# Patient Record
Sex: Male | Born: 1960 | Race: Black or African American | Hispanic: No | Smoking: Never smoker
Health system: Southern US, Community
[De-identification: ages and names within clinical notes are randomized; demographics above are authoritative.]

## PROBLEM LIST (undated history)

## (undated) DIAGNOSIS — I428 Other cardiomyopathies: Secondary | ICD-10-CM

## (undated) DIAGNOSIS — E669 Obesity, unspecified: Secondary | ICD-10-CM

## (undated) DIAGNOSIS — I509 Heart failure, unspecified: Secondary | ICD-10-CM

## (undated) DIAGNOSIS — N186 End stage renal disease: Secondary | ICD-10-CM

## (undated) DIAGNOSIS — E119 Type 2 diabetes mellitus without complications: Secondary | ICD-10-CM

## (undated) DIAGNOSIS — Z992 Dependence on renal dialysis: Secondary | ICD-10-CM

## (undated) DIAGNOSIS — I10 Essential (primary) hypertension: Secondary | ICD-10-CM

## (undated) DIAGNOSIS — I2510 Atherosclerotic heart disease of native coronary artery without angina pectoris: Secondary | ICD-10-CM

## (undated) DIAGNOSIS — I739 Peripheral vascular disease, unspecified: Secondary | ICD-10-CM

## (undated) DIAGNOSIS — N19 Unspecified kidney failure: Secondary | ICD-10-CM

## (undated) DIAGNOSIS — I5020 Unspecified systolic (congestive) heart failure: Secondary | ICD-10-CM

## (undated) DIAGNOSIS — I5022 Chronic systolic (congestive) heart failure: Secondary | ICD-10-CM

## (undated) DIAGNOSIS — I219 Acute myocardial infarction, unspecified: Secondary | ICD-10-CM

## (undated) HISTORY — PX: IRRIGATION AND DEBRIDEMENT ABSCESS: SHX5252

## (undated) HISTORY — PX: AV FISTULA PLACEMENT: SHX1204

## (undated) HISTORY — PX: TOE AMPUTATION: SHX809

## (undated) HISTORY — DX: Peripheral vascular disease, unspecified: I73.9

## (undated) HISTORY — PX: EYE SURGERY: SHX253

## (undated) SURGERY — IRRIGATION AND DEBRIDEMENT FOOT
Anesthesia: Monitor Anesthesia Care | Laterality: Right

## (undated) SURGICAL SUPPLY — 1 items: BALLN ATHLETIS 9X40X75 (BALLOONS) ×2 IMPLANT

---

## 2003-08-21 ENCOUNTER — Emergency Department (HOSPITAL_COMMUNITY): Admission: EM | Admit: 2003-08-21 | Discharge: 2003-08-21 | Payer: Self-pay | Admitting: Emergency Medicine

## 2008-02-06 ENCOUNTER — Emergency Department (HOSPITAL_COMMUNITY): Admission: EM | Admit: 2008-02-06 | Discharge: 2008-02-06 | Payer: Self-pay | Admitting: Emergency Medicine

## 2018-02-17 HISTORY — PX: CARDIAC CATHETERIZATION: SHX172

## 2018-03-17 DIAGNOSIS — I10 Essential (primary) hypertension: Secondary | ICD-10-CM | POA: Insufficient documentation

## 2018-03-17 DIAGNOSIS — I428 Other cardiomyopathies: Secondary | ICD-10-CM | POA: Insufficient documentation

## 2018-05-14 DIAGNOSIS — E119 Type 2 diabetes mellitus without complications: Secondary | ICD-10-CM | POA: Insufficient documentation

## 2018-07-23 DIAGNOSIS — E785 Hyperlipidemia, unspecified: Secondary | ICD-10-CM | POA: Diagnosis present

## 2018-07-23 DIAGNOSIS — I739 Peripheral vascular disease, unspecified: Secondary | ICD-10-CM | POA: Insufficient documentation

## 2018-07-23 DIAGNOSIS — M109 Gout, unspecified: Secondary | ICD-10-CM | POA: Insufficient documentation

## 2018-07-23 DIAGNOSIS — I5022 Chronic systolic (congestive) heart failure: Secondary | ICD-10-CM | POA: Insufficient documentation

## 2018-07-23 DIAGNOSIS — E1122 Type 2 diabetes mellitus with diabetic chronic kidney disease: Secondary | ICD-10-CM | POA: Insufficient documentation

## 2018-07-26 DIAGNOSIS — N189 Chronic kidney disease, unspecified: Secondary | ICD-10-CM | POA: Insufficient documentation

## 2018-07-27 DIAGNOSIS — E039 Hypothyroidism, unspecified: Secondary | ICD-10-CM | POA: Diagnosis present

## 2018-07-27 DIAGNOSIS — N2581 Secondary hyperparathyroidism of renal origin: Secondary | ICD-10-CM | POA: Insufficient documentation

## 2018-09-20 ENCOUNTER — Encounter (HOSPITAL_COMMUNITY): Payer: Self-pay | Admitting: Emergency Medicine

## 2018-09-20 ENCOUNTER — Emergency Department (HOSPITAL_COMMUNITY): Payer: Medicare Other

## 2018-09-20 ENCOUNTER — Other Ambulatory Visit: Payer: Self-pay

## 2018-09-20 ENCOUNTER — Inpatient Hospital Stay (HOSPITAL_COMMUNITY)
Admission: EM | Admit: 2018-09-20 | Discharge: 2018-09-24 | DRG: 312 | Disposition: A | Discharge status: Home / Self Care | Payer: Medicare Other | Attending: Internal Medicine | Admitting: Internal Medicine

## 2018-09-20 DIAGNOSIS — R509 Fever, unspecified: Secondary | ICD-10-CM | POA: Diagnosis not present

## 2018-09-20 DIAGNOSIS — I5022 Chronic systolic (congestive) heart failure: Secondary | ICD-10-CM | POA: Diagnosis present

## 2018-09-20 DIAGNOSIS — Z7982 Long term (current) use of aspirin: Secondary | ICD-10-CM

## 2018-09-20 DIAGNOSIS — R112 Nausea with vomiting, unspecified: Secondary | ICD-10-CM | POA: Diagnosis present

## 2018-09-20 DIAGNOSIS — M898X9 Other specified disorders of bone, unspecified site: Secondary | ICD-10-CM | POA: Diagnosis present

## 2018-09-20 DIAGNOSIS — D631 Anemia in chronic kidney disease: Secondary | ICD-10-CM | POA: Diagnosis present

## 2018-09-20 DIAGNOSIS — I132 Hypertensive heart and chronic kidney disease with heart failure and with stage 5 chronic kidney disease, or end stage renal disease: Secondary | ICD-10-CM | POA: Diagnosis present

## 2018-09-20 DIAGNOSIS — Z992 Dependence on renal dialysis: Secondary | ICD-10-CM

## 2018-09-20 DIAGNOSIS — I10 Essential (primary) hypertension: Secondary | ICD-10-CM | POA: Diagnosis present

## 2018-09-20 DIAGNOSIS — I953 Hypotension of hemodialysis: Secondary | ICD-10-CM | POA: Diagnosis not present

## 2018-09-20 DIAGNOSIS — K219 Gastro-esophageal reflux disease without esophagitis: Secondary | ICD-10-CM | POA: Diagnosis present

## 2018-09-20 DIAGNOSIS — N186 End stage renal disease: Secondary | ICD-10-CM | POA: Diagnosis present

## 2018-09-20 DIAGNOSIS — E1122 Type 2 diabetes mellitus with diabetic chronic kidney disease: Secondary | ICD-10-CM | POA: Diagnosis present

## 2018-09-20 DIAGNOSIS — Z794 Long term (current) use of insulin: Secondary | ICD-10-CM

## 2018-09-20 DIAGNOSIS — I428 Other cardiomyopathies: Secondary | ICD-10-CM | POA: Diagnosis present

## 2018-09-20 DIAGNOSIS — Z79899 Other long term (current) drug therapy: Secondary | ICD-10-CM

## 2018-09-20 DIAGNOSIS — I447 Left bundle-branch block, unspecified: Secondary | ICD-10-CM | POA: Diagnosis present

## 2018-09-20 DIAGNOSIS — I2510 Atherosclerotic heart disease of native coronary artery without angina pectoris: Secondary | ICD-10-CM | POA: Diagnosis present

## 2018-09-20 DIAGNOSIS — E871 Hypo-osmolality and hyponatremia: Secondary | ICD-10-CM | POA: Diagnosis present

## 2018-09-20 DIAGNOSIS — E785 Hyperlipidemia, unspecified: Secondary | ICD-10-CM | POA: Diagnosis present

## 2018-09-20 DIAGNOSIS — R42 Dizziness and giddiness: Secondary | ICD-10-CM | POA: Diagnosis present

## 2018-09-20 DIAGNOSIS — B349 Viral infection, unspecified: Secondary | ICD-10-CM | POA: Diagnosis present

## 2018-09-20 DIAGNOSIS — R05 Cough: Secondary | ICD-10-CM | POA: Diagnosis present

## 2018-09-20 DIAGNOSIS — R059 Cough, unspecified: Secondary | ICD-10-CM

## 2018-09-20 DIAGNOSIS — E119 Type 2 diabetes mellitus without complications: Secondary | ICD-10-CM

## 2018-09-20 HISTORY — DX: Essential (primary) hypertension: I10

## 2018-09-20 HISTORY — DX: Type 2 diabetes mellitus without complications: E11.9

## 2018-09-20 HISTORY — DX: Unspecified kidney failure: N19

## 2018-09-20 LAB — CBC WITH DIFFERENTIAL/PLATELET
ABS IMMATURE GRANULOCYTES: 0.03 10*3/uL (ref 0.00–0.07)
BASOS PCT: 1 %
Basophils Absolute: 0.1 10*3/uL (ref 0.0–0.1)
Eosinophils Absolute: 0.3 10*3/uL (ref 0.0–0.5)
Eosinophils Relative: 3 %
HEMATOCRIT: 44.8 % (ref 39.0–52.0)
Hemoglobin: 14.1 g/dL (ref 13.0–17.0)
Immature Granulocytes: 0 %
LYMPHS ABS: 1.1 10*3/uL (ref 0.7–4.0)
Lymphocytes Relative: 11 %
MCH: 30.1 pg (ref 26.0–34.0)
MCHC: 31.5 g/dL (ref 30.0–36.0)
MCV: 95.5 fL (ref 80.0–100.0)
MONOS PCT: 15 %
Monocytes Absolute: 1.5 10*3/uL — ABNORMAL HIGH (ref 0.1–1.0)
NEUTROS ABS: 7 10*3/uL (ref 1.7–7.7)
Neutrophils Relative %: 70 %
PLATELETS: 185 10*3/uL (ref 150–400)
RBC: 4.69 MIL/uL (ref 4.22–5.81)
RDW: 16.2 % — ABNORMAL HIGH (ref 11.5–15.5)
WBC: 9.9 10*3/uL (ref 4.0–10.5)
nRBC: 0 % (ref 0.0–0.2)

## 2018-09-20 LAB — COMPREHENSIVE METABOLIC PANEL
ALT: 15 U/L (ref 0–44)
AST: 11 U/L — AB (ref 15–41)
Albumin: 4.2 g/dL (ref 3.5–5.0)
Alkaline Phosphatase: 99 U/L (ref 38–126)
Anion gap: 12 (ref 5–15)
BILIRUBIN TOTAL: 0.8 mg/dL (ref 0.3–1.2)
BUN: 19 mg/dL (ref 6–20)
CALCIUM: 8.9 mg/dL (ref 8.9–10.3)
CO2: 30 mmol/L (ref 22–32)
CREATININE: 7.19 mg/dL — AB (ref 0.61–1.24)
Chloride: 89 mmol/L — ABNORMAL LOW (ref 98–111)
GFR, EST AFRICAN AMERICAN: 9 mL/min — AB (ref >60)
GFR, EST NON AFRICAN AMERICAN: 8 mL/min — AB (ref >60)
Glucose, Bld: 293 mg/dL — ABNORMAL HIGH (ref 70–99)
Potassium: 5.1 mmol/L (ref 3.5–5.1)
Sodium: 131 mmol/L — ABNORMAL LOW (ref 135–145)
TOTAL PROTEIN: 9 g/dL — AB (ref 6.5–8.1)

## 2018-09-20 LAB — URINALYSIS, ROUTINE W REFLEX MICROSCOPIC
Bacteria, UA: NONE SEEN
Bilirubin Urine: NEGATIVE
Glucose, UA: >=500 mg/dL — AB
Hgb urine dipstick: NEGATIVE
Ketones, ur: NEGATIVE mg/dL
Nitrite: NEGATIVE
Protein, ur: >=300 mg/dL — AB
SPECIFIC GRAVITY, URINE: 1.012 (ref 1.005–1.030)
pH: 8 (ref 5.0–8.0)

## 2018-09-20 LAB — LACTIC ACID, PLASMA
LACTIC ACID, VENOUS: 2.3 mmol/L — AB (ref 0.5–1.9)
Lactic Acid, Venous: 2.4 mmol/L (ref 0.5–1.9)

## 2018-09-20 LAB — INFLUENZA PANEL BY PCR (TYPE A & B)
INFLAPCR: NEGATIVE
INFLBPCR: NEGATIVE

## 2018-09-20 LAB — LIPASE, BLOOD: LIPASE: 56 U/L — AB (ref 11–51)

## 2018-09-20 MED ORDER — INSULIN ASPART 100 UNIT/ML ~~LOC~~ SOLN: 0.0000 [IU] | SUBCUTANEOUS | Status: DC

## 2018-09-20 MED ORDER — ACETAMINOPHEN 325 MG PO TABS
650.0000 mg | ORAL_TABLET | Freq: Four times a day (QID) | ORAL | Status: DC | PRN
  Administered 2018-09-21 – 2018-09-22 (×2): 650 mg via ORAL
  Filled 2018-09-20 (×2): qty 2

## 2018-09-20 MED ORDER — ATORVASTATIN CALCIUM 40 MG PO TABS
40.0000 mg | ORAL_TABLET | Freq: Every evening | ORAL | Status: DC
  Administered 2018-09-21 – 2018-09-23 (×4): 40 mg via ORAL
  Filled 2018-09-20 (×4): qty 1

## 2018-09-20 MED ORDER — ONDANSETRON HCL 4 MG PO TABS: 4.0000 mg | ORAL_TABLET | Freq: Four times a day (QID) | ORAL | Status: DC | PRN

## 2018-09-20 MED ORDER — SODIUM BICARBONATE 650 MG PO TABS
650.0000 mg | ORAL_TABLET | Freq: Four times a day (QID) | ORAL | Status: DC
  Administered 2018-09-21 – 2018-09-24 (×15): 650 mg via ORAL
  Filled 2018-09-20 (×22): qty 1

## 2018-09-20 MED ORDER — ACETAMINOPHEN 500 MG PO TABS
1000.0000 mg | ORAL_TABLET | Freq: Once | ORAL | Status: AC
  Administered 2018-09-20: 1000 mg via ORAL
  Filled 2018-09-20: qty 2

## 2018-09-20 MED ORDER — SEVELAMER CARBONATE 2.4 G PO PACK
2.4000 g | PACK | Freq: Three times a day (TID) | ORAL | Status: DC
  Filled 2018-09-20 (×5): qty 1

## 2018-09-20 MED ORDER — PANTOPRAZOLE SODIUM 20 MG PO TBEC: 20.0000 mg | DELAYED_RELEASE_TABLET | Freq: Every day | ORAL | Status: DC

## 2018-09-20 MED ORDER — PANTOPRAZOLE SODIUM 40 MG PO TBEC
40.0000 mg | DELAYED_RELEASE_TABLET | Freq: Every day | ORAL | Status: DC
  Administered 2018-09-21: 40 mg via ORAL
  Filled 2018-09-20: qty 1

## 2018-09-20 MED ORDER — ONDANSETRON HCL 4 MG/2ML IJ SOLN: 4.0000 mg | Freq: Four times a day (QID) | INTRAMUSCULAR | Status: DC | PRN

## 2018-09-20 MED ORDER — LACTATED RINGERS IV BOLUS
500.0000 mL | Freq: Once | INTRAVENOUS | Status: AC
  Administered 2018-09-20: 500 mL via INTRAVENOUS

## 2018-09-20 MED ORDER — CARVEDILOL 12.5 MG PO TABS
25.0000 mg | ORAL_TABLET | Freq: Two times a day (BID) | ORAL | Status: DC
  Administered 2018-09-21 – 2018-09-24 (×8): 25 mg via ORAL
  Filled 2018-09-20 (×8): qty 2

## 2018-09-20 MED ORDER — ACETAMINOPHEN 650 MG RE SUPP: 650.0000 mg | Freq: Four times a day (QID) | RECTAL | Status: DC | PRN

## 2018-09-20 MED ORDER — INSULIN ASPART 100 UNIT/ML ~~LOC~~ SOLN
0.0000 [IU] | Freq: Three times a day (TID) | SUBCUTANEOUS | Status: DC
  Administered 2018-09-21: 5 [IU] via SUBCUTANEOUS
  Administered 2018-09-21 (×2): 3 [IU] via SUBCUTANEOUS
  Administered 2018-09-22 (×3): 5 [IU] via SUBCUTANEOUS
  Administered 2018-09-23: 2 [IU] via SUBCUTANEOUS
  Administered 2018-09-23 (×2): 3 [IU] via SUBCUTANEOUS
  Administered 2018-09-24: 5 [IU] via SUBCUTANEOUS
  Administered 2018-09-24: 3 [IU] via SUBCUTANEOUS

## 2018-09-20 MED ORDER — ONDANSETRON HCL 4 MG/2ML IJ SOLN
4.0000 mg | Freq: Once | INTRAMUSCULAR | Status: AC
  Administered 2018-09-20: 4 mg via INTRAVENOUS
  Filled 2018-09-20: qty 2

## 2018-09-20 MED ORDER — INSULIN NPH (HUMAN) (ISOPHANE) 100 UNIT/ML ~~LOC~~ SUSP
5.0000 [IU] | Freq: Two times a day (BID) | SUBCUTANEOUS | Status: DC
  Administered 2018-09-21 (×2): 5 [IU] via SUBCUTANEOUS
  Filled 2018-09-20 (×2): qty 10

## 2018-09-20 MED ORDER — HEPARIN SODIUM (PORCINE) 5000 UNIT/ML IJ SOLN
5000.0000 [IU] | Freq: Three times a day (TID) | INTRAMUSCULAR | Status: DC
  Administered 2018-09-21 – 2018-09-24 (×12): 5000 [IU] via SUBCUTANEOUS
  Filled 2018-09-20 (×12): qty 1

## 2018-09-20 MED ORDER — CARVEDILOL 12.5 MG PO TABS: 25.0000 mg | ORAL_TABLET | Freq: Two times a day (BID) | ORAL | Status: DC

## 2018-09-20 MED ORDER — ASPIRIN 81 MG PO CHEW
81.0000 mg | CHEWABLE_TABLET | Freq: Every day | ORAL | Status: DC
  Administered 2018-09-21 – 2018-09-24 (×4): 81 mg via ORAL
  Filled 2018-09-20 (×4): qty 1

## 2018-09-20 NOTE — ED Notes (Signed)
Patient refuse to ambulate in hall way RN notfied

## 2018-09-20 NOTE — ED Notes (Signed)
Date and time results received: 09/20/18 2227 (use smartphrase ".now" to insert current time)  Test: lactic acid Critical Value: 2.5  Name of Provider Notified: Dr. Dayna Barker At 2227  Orders Received? Or Actions Taken?: no/na

## 2018-09-20 NOTE — H&P (Signed)
History and Physical    UTHMAN MROCZKOWSKI UXN:[id number] DOB: [birth year] DOA: 09/20/2018  PCP: Patient, No Pcp Per  Patient coming from: Home  I have personally briefly reviewed patient's old medical records in [place]  Chief Complaint: Fever, lightheaded  HPI: DOUGLAS SMOLINSKY is a 57 y.o. male with medical history significant of ESRD dialysis MWF, DM2, HTN.  Patient was finishing up dialysis today, removed 6.3L.  Patient developed lightheadedness and one episode of vomiting.  Found to be running fever and have BP 25K systolic.  Brought in to ED.  On ROS it seems patient has been having chronic N/V to some degree.  Fever ongoing since Friday he reports.   ED Course: BP improved to 270W systolic with just 237SE LR.  Tm 101.5.  CXR neg.  Tolerating POs.  LFTs nl.  LA 2.4.   Review of Systems: As per HPI otherwise 10 point review of systems negative.   Past Medical History:  Diagnosis Date  . Diabetes mellitus without complication ([place])   . Hypertension   . Kidney failure     Past Surgical History:  Procedure Laterality Date  . AV FISTULA PLACEMENT    . IRRIGATION AND DEBRIDEMENT ABSCESS    . TOE AMPUTATION       reports that he has never smoked. He has never used smokeless tobacco. He reports that he drank alcohol. He reports that he does not use drugs.  No Known Allergies  History reviewed. No pertinent family history. No sick contacts in family.  Prior to Admission medications   Medication Sig Start Date End Date Taking? Authorizing Provider  amLODipine (NORVASC) 5 MG tablet Take 5 mg by mouth daily. 07/26/18  Yes [provider]  aspirin 81 MG chewable tablet Chew 81 mg by mouth daily. 09/01/18  Yes [provider]  atorvastatin (LIPITOR) 40 MG tablet Take 40 mg by mouth every evening.  09/01/18  Yes [provider]  carvedilol (COREG) 25 MG tablet Take 25 mg by mouth 2 (two) times daily. 08/31/18  Yes [provider]  ENTRESTO  24-26 MG Take 1 tablet by mouth 2 (two) times daily. 07/29/18  Yes [provider]  HUMULIN 70/30 (70-30) 100 UNIT/ML injection Inject 30 Units into the skin every other day. At bedtime:: Prescribed 20 units in the morning and 6 units in the evening 08/31/18  Yes [provider]  lidocaine-prilocaine (EMLA) cream Apply 1 application topically every Monday, Wednesday, and Friday. At dialysis 08/17/18  Yes [provider]  pantoprazole (PROTONIX) 20 MG tablet Take 20 mg by mouth daily.    Yes [provider]  sildenafil (VIAGRA) 100 MG tablet Take 100 mg by mouth daily as needed for erectile dysfunction.  09/02/18  Yes [provider]  sodium bicarbonate 650 MG tablet Take 1 tablet by mouth 4 (four) times daily. 08/31/18  Yes [provider]  RENVELA 2.4 g PACK VIGOROUSLY [place] 1 PACKET IN WATER (IT WILL NOT DISSOLVE) AND DRINK 3 TIMES DAILY WITH MEALS 07/16/18   [provider]    Physical Exam: Vitals:   09/20/18 1749 09/20/18 1750 09/20/18 2114 09/20/18 2130  BP: (!) 97/57  (!) 141/78 133/60  Pulse: 83  90 88  Resp: 20  20   Temp: 99.8 F (37.7 C)  (!) 101.5 F (38.6 C)   TempSrc: Oral  Oral   SpO2: 95%  94% 95%  Weight:  116.1 kg    Height:  6'  1" (1.854 m)      Constitutional: NAD, calm, comfortable Eyes: PERRL, lids and conjunctivae normal ENMT: Mucous membranes are moist. Posterior pharynx clear of any exudate or lesions.Normal dentition.  Neck: normal, supple, no masses, no thyromegaly Respiratory: clear to auscultation bilaterally, no wheezing, no crackles. Normal respiratory effort. No accessory muscle use.  Cardiovascular: Regular rate and rhythm, no murmurs / rubs / gallops. No extremity edema. 2+ pedal pulses. No carotid bruits.  Abdomen: no tenderness, no masses palpated. No hepatosplenomegaly. Bowel sounds positive.  Musculoskeletal: no clubbing / cyanosis. No joint deformity upper and lower extremities.  Good ROM, no contractures. Normal muscle tone.  Skin: no rashes, lesions, ulcers. No induration Neurologic: CN 2-12 grossly intact. Sensation intact, DTR normal. Strength 5/5 in all 4.  Psychiatric: Normal judgment and insight. Alert and oriented x 3. Normal mood.    Labs on Admission: I have personally reviewed following labs and imaging studies  CBC: Recent Labs  Lab 09/20/18 1902  WBC 9.9  NEUTROABS 7.0  HGB 14.1  HCT 44.8  MCV 95.5  PLT 706   Basic Metabolic Panel: Recent Labs  Lab 09/20/18 1902  NA 131*  K 5.1  CL 89*  CO2 30  GLUCOSE 293*  BUN 19  CREATININE 7.19*  CALCIUM 8.9   GFR: Estimated Creatinine Clearance: 15.5 mL/min (A) (by C-G formula based on SCr of 7.19 mg/dL (H)). Liver Function Tests: Recent Labs  Lab 09/20/18 1902  AST 11*  ALT 15  ALKPHOS 99  BILITOT 0.8  PROT 9.0*  ALBUMIN 4.2   Recent Labs  Lab 09/20/18 1902  LIPASE 56*   No results for input(s): AMMONIA in the last 168 hours. Coagulation Profile: No results for input(s): INR, PROTIME in the last 168 hours. Cardiac Enzymes: No results for input(s): CKTOTAL, CKMB, CKMBINDEX, TROPONINI in the last 168 hours. BNP (last 3 results) No results for input(s): PROBNP in the last 8760 hours. HbA1C: No results for input(s): HGBA1C in the last 72 hours. CBG: No results for input(s): GLUCAP in the last 168 hours. Lipid Profile: No results for input(s): CHOL, HDL, LDLCALC, TRIG, CHOLHDL, LDLDIRECT in the last 72 hours. Thyroid Function Tests: No results for input(s): TSH, T4TOTAL, FREET4, T3FREE, THYROIDAB in the last 72 hours. Anemia Panel: No results for input(s): VITAMINB12, FOLATE, FERRITIN, TIBC, IRON, RETICCTPCT in the last 72 hours. Urine analysis:    Component Value Date/Time   COLORURINE YELLOW 09/20/2018 2118   APPEARANCEUR CLEAR 09/20/2018 2118   LABSPEC 1.012 09/20/2018 2118   PHURINE 8.0 09/20/2018 2118   GLUCOSEU >=500 (A) 09/20/2018 2118   HGBUR NEGATIVE 09/20/2018  2118   BILIRUBINUR NEGATIVE 09/20/2018 2118   KETONESUR NEGATIVE 09/20/2018 2118   PROTEINUR >=300 (A) 09/20/2018 2118   NITRITE NEGATIVE 09/20/2018 2118   LEUKOCYTESUR SMALL (A) 09/20/2018 2118    Radiological Exams on Admission: Dg Chest 2 View  Result Date: 09/20/2018 CLINICAL DATA:  Nausea and vomiting for 3 days. Lightheaded and dizzy. Decreased blood pressure today after dialysis. History of diabetes, hypertension, kidney failure. EXAM: CHEST - 2 VIEW COMPARISON:  None. FINDINGS: Heart size and pulmonary vascularity are normal for technique. Lungs are clear and expanded. No blunting of costophrenic angles. No pneumothorax. Mediastinal contours appear intact. Degenerative changes in the spine. IMPRESSION: No active cardiopulmonary disease. Electronically Signed   By: Lucienne Capers M.D.   On: 09/20/2018 22:03    EKG: Independently reviewed.  Assessment/Plan Principal Problem:   Fever Active Problems:   ESRD (end stage  renal disease) ([place])   Nausea & vomiting   DM2 (diabetes mellitus, type 2) ([facility])   HTN (hypertension)    1. Fever - possibly just viral illness? 1. borderline hypotension on presentation after 6.3L off at dialysis today.  Resolved after just 500cc LR 2. UCx, BCx 3. Repeat CBC/BMP in AM 4. Tylenol for fever 5. Holding of on ABx for the moment 6. Zofran PRN nausea 7. Influenza PCR pending 2. HTN - 1. Given the hypotension on presentation: will hold entresto and norvasc for the moment 2. Continue coreg 3. DM2 - 1. NPH 5u BID 2. Sensitive scale SSI AC 4. ESRD - 1. Consult nephrology for routine IP dialysis, looks like he gets this MWF 5. GERD - 1. Told patient to stop taking tums, start taking renvela and will increase protonix dose to 40mg  PO daily.  DVT prophylaxis: Heparin  [place] Code Status: Full Family Communication: Family at bedside Disposition Plan: Home after admit Consults called: Nephrology for routine IP dialysis Admission status: Place in  obs     Allanah Mcfarland, [facility] Pager [phone] Only works nights!  If 7AM-7PM, please contact the primary day team physician taking care of patient  [email or web address] Password [email or web address]  09/20/2018, 11:08 PM

## 2018-09-20 NOTE — ED Provider Notes (Signed)
Emergency Department Provider Note   I have reviewed the triage vital signs and the nursing notes.   HISTORY  Chief Complaint Emesis   HPI [name] [name] is a 57 y.o. male who has multiple medical problems documented below the presents to the emergency department today secondary to notes, generalized weakness and vomiting.  Patient states that he vomited a couple times while at dialysis.  Nursing note states he is get dialysis for over 6 hours however patient states he was only there for 4 hours.  It took off normal amount.  He started feel little bit lightheaded during that notes when he vomited.  Nonbloody nonbilious.  Last meal was earlier this morning.  EMS picked him up and brought him here for further evaluation and given Zofran in route which seems to help his symptoms some.  He has not had any diarrhea or urinary symptoms.  No rashes.  No headaches.  No recent fevers or sick contacts he knows of.  No chest pain, abdominal pain or back pain at this time. No other associated or modifying symptoms.    Past Medical History:  Diagnosis Date  . Diabetes mellitus without complication ([place])   . Hypertension   . Kidney failure     There are no active problems to display for this patient.   Past Surgical History:  Procedure Laterality Date  . AV FISTULA PLACEMENT    . IRRIGATION AND DEBRIDEMENT ABSCESS    . TOE AMPUTATION      Current Outpatient Rx  . Order #: [id number] Class: Historical Med  . Order #: [id number] Class: Historical Med  . Order #: [id number] Class: Historical Med  . Order #: [id number] Class: Historical Med  . Order #: [id number] Class: Historical Med  . Order #: [id number] Class: Historical Med  . Order #: [id number] Class: Historical Med  . Order #: [id number] Class: Historical Med  . Order #: [id number] Class: Historical Med  . Order #: [id number] Class: Historical Med  . Order #: [id number] Class: Historical Med    Allergies Patient has no known allergies.  History reviewed.  No pertinent family history.  Social History Social History   Tobacco Use  . Smoking status: Never Smoker  . Smokeless tobacco: Never Used  Substance Use Topics  . Alcohol use: Not Currently  . Drug use: Never    Review of Systems  All other systems negative except as documented in the HPI. All pertinent positives and negatives as reviewed in the HPI. ____________________________________________   PHYSICAL EXAM:  VITAL SIGNS: ED Triage Vitals  Enc Vitals Group     BP 09/20/18 1749 (!) 97/57     Pulse Rate 09/20/18 1749 83     Resp 09/20/18 1749 20     Temp 09/20/18 1749 99.8 F (37.7 C)     Temp Source 09/20/18 1749 Oral     SpO2 09/20/18 1749 95 %     Weight 09/20/18 1750 256 lb (116.1 kg)     Height 09/20/18 1750 6\' 1"  (1.854 m)    Constitutional: Alert and oriented. Well appearing and in no acute distress. Eyes: Conjunctivae are normal. PERRL. EOMI. Head: Atraumatic. Nose: No congestion/rhinnorhea. Mouth/Throat: Mucous membranes are moist.  Oropharynx non-erythematous. Neck: No stridor.  No meningeal signs.   Cardiovascular: Normal rate, regular rhythm. Good peripheral circulation. Grossly normal heart sounds.   Respiratory: Normal respiratory effort.  No retractions. Lungs CTAB. Gastrointestinal: Soft and mild epigastric ttp. No distention.  Musculoskeletal: No lower extremity tenderness nor edema. No gross deformities of extremities. Neurologic:  Normal speech and language. No gross focal neurologic deficits are appreciated.  Skin:  Skin is warm, dry and intact. No rash noted.   ____________________________________________   LABS (all labs ordered are listed, but only abnormal results are displayed)  Labs Reviewed  CBC WITH DIFFERENTIAL/PLATELET - Abnormal; Notable for the following components:      Result Value   RDW 16.2 (*)    Monocytes Absolute 1.5 (*)    All other components within normal limits  COMPREHENSIVE METABOLIC PANEL - Abnormal; Notable  for the following components:   Sodium 131 (*)    Chloride 89 (*)    Glucose, Bld 293 (*)    Creatinine, Ser 7.19 (*)    Total Protein 9.0 (*)    AST 11 (*)    GFR calc non Af Amer 8 (*)    GFR calc Af Amer 9 (*)    All other components within normal limits  LIPASE, BLOOD - Abnormal; Notable for the following components:   Lipase 56 (*)    All other components within normal limits  LACTIC ACID, PLASMA - Abnormal; Notable for the following components:   Lactic Acid, Venous 2.4 (*)    All other components within normal limits  CULTURE, BLOOD (ROUTINE X 2)  CULTURE, BLOOD (ROUTINE X 2)  URINALYSIS, ROUTINE W REFLEX MICROSCOPIC  LACTIC ACID, PLASMA  INFLUENZA PANEL BY PCR (TYPE A & B)   ____________________________________________  EKG   EKG Interpretation  Date/Time:  Monday September 20 2018 18:53:23 EST Ventricular Rate:  86 PR Interval:  162 QRS Duration: 132 QT Interval:  408 QTC Calculation: 488 R Axis:   -55 Text Interpretation:  Normal sinus rhythm Possible Left atrial enlargement Left axis deviation Left bundle branch block Abnormal ECG No old tracing to compare Confirmed by Merrily Pew [phone]) on 09/20/2018 8:07:04 PM       ____________________________________________  RADIOLOGY  Dg Chest 2 View  Result Date: 09/20/2018 CLINICAL DATA:  Nausea and vomiting for 3 days. Lightheaded and dizzy. Decreased blood pressure today after dialysis. History of diabetes, hypertension, kidney failure. EXAM: CHEST - 2 VIEW COMPARISON:  None. FINDINGS: Heart size and pulmonary vascularity are normal for technique. Lungs are clear and expanded. No blunting of costophrenic angles. No pneumothorax. Mediastinal contours appear intact. Degenerative changes in the spine. IMPRESSION: No active cardiopulmonary disease. Electronically Signed   By: Lucienne Capers M.D.   On: 09/20/2018 22:03    ____________________________________________   PROCEDURES  Procedure(s) performed:    Procedures   ____________________________________________   INITIAL IMPRESSION / ASSESSMENT AND PLAN / ED COURSE  Symptomatic treatment. Hold on IVF for time being. Patient persistently weak with nausea. Bolus/zofran ordered. Found to be febrile with elevated lactic acid. No clear source of infection at this time, but obviously high risk 2/2 dialysis. cxr added,  Cultures added, urine added (doesn't make urien often),influenza added 2/2 cough. Will d/w hospitalist regarding observation and possible GB US as I don't want to scan with contrast if able to help it.    Pertinent labs & imaging results that were available during my care of the patient were reviewed by me and considered in my medical decision making (see chart for details).  ____________________________________________  FINAL CLINICAL IMPRESSION(S) / ED DIAGNOSES  Final diagnoses:  None     MEDICATIONS GIVEN DURING THIS VISIT:  Medications  lactated ringers bolus 500 mL (has no administration in time range)  ondansetron (ZOFRAN) injection 4 mg (has no administration in time range)  acetaminophen (TYLENOL) tablet 1,000 mg (1,000 mg Oral Given 09/20/18 2201)     NEW OUTPATIENT MEDICATIONS STARTED DURING THIS VISIT:  New Prescriptions   No medications on file    Note:  This note was prepared with assistance of Dragon voice recognition software. Occasional wrong-word or sound-a-like substitutions may have occurred due to the inherent limitations of voice recognition software.   Merrily Pew, MD 09/21/18 1229

## 2018-09-20 NOTE — ED Triage Notes (Signed)
PT was finishing up dialysis today and EMS reports they removed 6.3L of fluid today. PT reports he felt lightheaded and had one episode of nausea with vomiting. PT reports the past 3 days he has had nausea and vomiting. PT was given 4mg  of zofan IM by EMS in route.

## 2018-09-20 NOTE — ED Notes (Signed)
Pt given meal tray.

## 2018-09-21 ENCOUNTER — Encounter (HOSPITAL_COMMUNITY): Payer: Self-pay | Admitting: Nephrology

## 2018-09-21 DIAGNOSIS — I10 Essential (primary) hypertension: Secondary | ICD-10-CM | POA: Diagnosis not present

## 2018-09-21 DIAGNOSIS — I9581 Postprocedural hypotension: Secondary | ICD-10-CM

## 2018-09-21 DIAGNOSIS — N186 End stage renal disease: Secondary | ICD-10-CM | POA: Diagnosis not present

## 2018-09-21 DIAGNOSIS — R509 Fever, unspecified: Secondary | ICD-10-CM | POA: Diagnosis not present

## 2018-09-21 DIAGNOSIS — R112 Nausea with vomiting, unspecified: Secondary | ICD-10-CM | POA: Diagnosis not present

## 2018-09-21 LAB — COMPREHENSIVE METABOLIC PANEL
ALBUMIN: 3.4 g/dL — AB (ref 3.5–5.0)
ALT: 11 U/L (ref 0–44)
AST: 11 U/L — AB (ref 15–41)
Alkaline Phosphatase: 79 U/L (ref 38–126)
Anion gap: 15 (ref 5–15)
BUN: 27 mg/dL — AB (ref 6–20)
CO2: 26 mmol/L (ref 22–32)
Calcium: 8.6 mg/dL — ABNORMAL LOW (ref 8.9–10.3)
Chloride: 92 mmol/L — ABNORMAL LOW (ref 98–111)
Creatinine, Ser: 8.33 mg/dL — ABNORMAL HIGH (ref 0.61–1.24)
GFR calc Af Amer: 7 mL/min — ABNORMAL LOW (ref >60)
GFR calc non Af Amer: 6 mL/min — ABNORMAL LOW (ref >60)
GLUCOSE: 299 mg/dL — AB (ref 70–99)
POTASSIUM: 4.6 mmol/L (ref 3.5–5.1)
SODIUM: 133 mmol/L — AB (ref 135–145)
Total Bilirubin: 0.9 mg/dL (ref 0.3–1.2)
Total Protein: 7.5 g/dL (ref 6.5–8.1)

## 2018-09-21 LAB — GLUCOSE, CAPILLARY
GLUCOSE-CAPILLARY: 274 mg/dL — AB (ref 70–99)
Glucose-Capillary: 242 mg/dL — ABNORMAL HIGH (ref 70–99)
Glucose-Capillary: 316 mg/dL — ABNORMAL HIGH (ref 70–99)
Glucose-Capillary: 274 mg/dL — ABNORMAL HIGH (ref 70–99)
Glucose-Capillary: 248 mg/dL — ABNORMAL HIGH (ref 70–99)

## 2018-09-21 LAB — CBC
HEMATOCRIT: 39.6 % (ref 39.0–52.0)
Hemoglobin: 12.6 g/dL — ABNORMAL LOW (ref 13.0–17.0)
MCH: 31 pg (ref 26.0–34.0)
MCHC: 31.8 g/dL (ref 30.0–36.0)
MCV: 97.5 fL (ref 80.0–100.0)
NRBC: 0 % (ref 0.0–0.2)
PLATELETS: 167 10*3/uL (ref 150–400)
RBC: 4.06 MIL/uL — ABNORMAL LOW (ref 4.22–5.81)
RDW: 16.2 % — AB (ref 11.5–15.5)
WBC: 9.5 10*3/uL (ref 4.0–10.5)

## 2018-09-21 MED ORDER — SACUBITRIL-VALSARTAN 24-26 MG PO TABS
1.0000 | ORAL_TABLET | Freq: Two times a day (BID) | ORAL | Status: DC
  Administered 2018-09-21 – 2018-09-23 (×5): 1 via ORAL
  Filled 2018-09-21 (×12): qty 1

## 2018-09-21 MED ORDER — INSULIN ASPART PROT & ASPART (70-30 MIX) 100 UNIT/ML ~~LOC~~ SUSP
20.0000 [IU] | Freq: Every day | SUBCUTANEOUS | Status: DC
  Administered 2018-09-22 – 2018-09-24 (×3): 20 [IU] via SUBCUTANEOUS
  Filled 2018-09-21: qty 10

## 2018-09-21 MED ORDER — SEVELAMER CARBONATE 800 MG PO TABS
2400.0000 mg | ORAL_TABLET | Freq: Three times a day (TID) | ORAL | Status: DC
  Administered 2018-09-21 – 2018-09-24 (×11): 2400 mg via ORAL
  Filled 2018-09-21 (×10): qty 3

## 2018-09-21 MED ORDER — PANTOPRAZOLE SODIUM 40 MG PO TBEC
40.0000 mg | DELAYED_RELEASE_TABLET | Freq: Every day | ORAL | Status: DC
  Administered 2018-09-22 – 2018-09-24 (×3): 40 mg via ORAL
  Filled 2018-09-21 (×3): qty 1

## 2018-09-21 MED ORDER — AMLODIPINE BESYLATE 5 MG PO TABS
5.0000 mg | ORAL_TABLET | Freq: Every day | ORAL | Status: DC
  Administered 2018-09-21 – 2018-09-23 (×3): 5 mg via ORAL
  Filled 2018-09-21 (×4): qty 1

## 2018-09-21 MED ORDER — CHLORHEXIDINE GLUCONATE CLOTH 2 % EX PADS
6.0000 | MEDICATED_PAD | Freq: Every day | CUTANEOUS | Status: DC
  Administered 2018-09-21 – 2018-09-24 (×4): 6 via TOPICAL

## 2018-09-21 MED ORDER — INSULIN ASPART PROT & ASPART (70-30 MIX) 100 UNIT/ML ~~LOC~~ SUSP
6.0000 [IU] | Freq: Every day | SUBCUTANEOUS | Status: DC
  Administered 2018-09-21 – 2018-09-23 (×3): 6 [IU] via SUBCUTANEOUS
  Filled 2018-09-21 (×2): qty 10

## 2018-09-21 NOTE — Consult Note (Signed)
Reason for Consult: End-stage renal disease Referring Physician: Quashaun Lazalde is an 57 y.o. male.  HPI: He is a patient who has history of diabetes, hypertension, end-stage renal disease on maintenance hemodialysis presently came because of fever, feeling lightheaded and episode of vomiting as end of dialysis.  Patient states that he has some occasional nausea and vomiting.  But he does not have any fever, chills or sweating.  According to him he has occasional nausea especially when they are removing fluid.  Patient went to the dialysis unit with 7 kg above his estimated target weight.  There were able to remove about 6 kg.  Presently he is feeling better.  He denies any more of fever, chills.  He denies also any nausea or vomiting.  Past Medical History:  Diagnosis Date  . Diabetes mellitus without complication ([place])   . Hypertension   . Kidney failure     Past Surgical History:  Procedure Laterality Date  . AV FISTULA PLACEMENT    . IRRIGATION AND DEBRIDEMENT ABSCESS    . TOE AMPUTATION      History reviewed. No pertinent family history.  Social History:  reports that he has never smoked. He has never used smokeless tobacco. He reports that he drank alcohol. He reports that he does not use drugs.  Allergies: No Known Allergies  Medications: I have reviewed the patient's current medications.  Results for orders placed or performed during the hospital encounter of 09/20/18 (from the past 48 hour(s))  CBC with Differential     Status: Abnormal   Collection Time: 09/20/18  7:02 PM  Result Value Ref Range   WBC 9.9 4.0 - 10.5 K/uL   RBC 4.69 4.22 - 5.81 MIL/uL   Hemoglobin 14.1 13.0 - 17.0 g/dL   HCT 44.8 39.0 - 52.0 %   MCV 95.5 80.0 - 100.0 fL   MCH 30.1 26.0 - 34.0 pg   MCHC 31.5 30.0 - 36.0 g/dL   RDW 16.2 (H) 11.5 - 15.5 %   Platelets 185 150 - 400 K/uL   nRBC 0.0 0.0 - 0.2 %   Neutrophils Relative % 70 %   Neutro Abs 7.0 1.7 - 7.7 K/uL   Lymphocytes Relative  11 %   Lymphs Abs 1.1 0.7 - 4.0 K/uL   Monocytes Relative 15 %   Monocytes Absolute 1.5 (H) 0.1 - 1.0 K/uL   Eosinophils Relative 3 %   Eosinophils Absolute 0.3 0.0 - 0.5 K/uL   Basophils Relative 1 %   Basophils Absolute 0.1 0.0 - 0.1 K/uL   Immature Granulocytes 0 %   Abs Immature Granulocytes 0.03 0.00 - 0.07 K/uL    Comment: Performed at [facility], [address] [place], [place] [address]  Comprehensive metabolic panel     Status: Abnormal   Collection Time: 09/20/18  7:02 PM  Result Value Ref Range   Sodium 131 (L) 135 - 145 mmol/L   Potassium 5.1 3.5 - 5.1 mmol/L   Chloride 89 (L) 98 - 111 mmol/L   CO2 30 22 - 32 mmol/L   Glucose, Bld 293 (H) 70 - 99 mg/dL   BUN 19 6 - 20 mg/dL   Creatinine, Ser 7.19 (H) 0.61 - 1.24 mg/dL   Calcium 8.9 8.9 - 10.3 mg/dL   Total Protein 9.0 (H) 6.5 - 8.1 g/dL   Albumin 4.2 3.5 - 5.0 g/dL   AST 11 (L) 15 - 41 U/L   ALT 15 0 - 44 U/L  Alkaline Phosphatase 99 38 - 126 U/L   Total Bilirubin 0.8 0.3 - 1.2 mg/dL   GFR calc non Af Amer 8 (L) >60 mL/min   GFR calc Af Amer 9 (L) >60 mL/min    Comment: (NOTE) The eGFR has been calculated using the CKD EPI equation. This calculation has not been validated in all clinical situations. eGFR's persistently <60 mL/min signify possible Chronic Kidney Disease.    Anion gap 12 5 - 15    Comment: Performed at [facility], [address], [place], [place] [address]  Lipase, blood     Status: Abnormal   Collection Time: 09/20/18  7:02 PM  Result Value Ref Range   Lipase 56 (H) 11 - 51 U/L    Comment: Performed at [facility], [address] [place], [place] [address]  Urinalysis, Routine w reflex microscopic     Status: Abnormal   Collection Time: 09/20/18  9:18 PM  Result Value Ref Range   Color, Urine YELLOW YELLOW   APPearance CLEAR CLEAR   Specific Gravity, Urine 1.012 1.005 - 1.030   pH 8.0 5.0 - 8.0   Glucose, UA >=500 (A) NEGATIVE mg/dL   Hgb urine dipstick NEGATIVE NEGATIVE   Bilirubin  Urine NEGATIVE NEGATIVE   Ketones, ur NEGATIVE NEGATIVE mg/dL   Protein, ur >=300 (A) NEGATIVE mg/dL   Nitrite NEGATIVE NEGATIVE   Leukocytes, UA SMALL (A) NEGATIVE   RBC / HPF 0-5 0 - 5 RBC/hpf   WBC, UA 21-50 0 - 5 WBC/hpf   Bacteria, UA NONE SEEN NONE SEEN   Squamous Epithelial / LPF 0-5 0 - 5    Comment: Performed at [facility], [address], [place], [place] [address]  Lactic acid, plasma     Status: Abnormal   Collection Time: 09/20/18  9:54 PM  Result Value Ref Range   Lactic Acid, Venous 2.4 (HH) 0.5 - 1.9 mmol/L    Comment: CRITICAL RESULT CALLED TO, READ BACK BY AND VERIFIED WITH: TALBET,T AT 2229 ON 11.4.2019 BY ISLEY,B Performed at [facility], [address], [place], [place] [address]   Influenza panel by PCR (type A & B)     Status: None   Collection Time: 09/20/18 10:29 PM  Result Value Ref Range   Influenza A By PCR NEGATIVE NEGATIVE   Influenza B By PCR NEGATIVE NEGATIVE    Comment: (NOTE) The Xpert Xpress Flu assay is intended as an aid in the diagnosis of  influenza and should not be used as a sole basis for treatment.  This  assay is FDA approved for nasopharyngeal swab specimens only. Nasal  washings and aspirates are unacceptable for Xpert Xpress Flu testing. Performed at [facility], [address] [place], [place] [address]   Blood culture (routine x 2)     Status: None (Preliminary result)   Collection Time: 09/20/18 10:56 PM  Result Value Ref Range   Specimen Description BLOOD RIGHT HAND    Special Requests      BOTTLES DRAWN AEROBIC AND ANAEROBIC Blood Culture adequate volume Performed at [facility], [address], [place],  [address]    Culture PENDING    Report Status PENDING   Blood culture (routine x 2)     Status: None (Preliminary result)   Collection Time: 09/20/18 11:07 PM  Result Value Ref Range   Specimen Description RIGHT ANTECUBITAL    Special Requests      BOTTLES DRAWN AEROBIC AND ANAEROBIC Blood Culture adequate  volume Performed  at [facility], [address] [place], [place] [address]    Culture PENDING    Report Status PENDING   Lactic acid, plasma     Status: Abnormal   Collection Time: 09/20/18 11:08 PM  Result Value Ref Range   Lactic Acid, Venous 2.3 (HH) 0.5 - 1.9 mmol/L    Comment: CRITICAL RESULT CALLED TO, READ BACK BY AND VERIFIED WITH: ALSTON NURSE NOTIFIED AT 2345 ON 09/20/2018 BY EVA Performed at [facility], [address] [place], [place] [address]   CBC     Status: Abnormal   Collection Time: 09/21/18  4:28 AM  Result Value Ref Range   WBC 9.5 4.0 - 10.5 K/uL   RBC 4.06 (L) 4.22 - 5.81 MIL/uL   Hemoglobin 12.6 (L) 13.0 - 17.0 g/dL   HCT 39.6 39.0 - 52.0 %   MCV 97.5 80.0 - 100.0 fL   MCH 31.0 26.0 - 34.0 pg   MCHC 31.8 30.0 - 36.0 g/dL   RDW 16.2 (H) 11.5 - 15.5 %   Platelets 167 150 - 400 K/uL   nRBC 0.0 0.0 - 0.2 %    Comment: Performed at [facility], [address], [place], [place] [address]  Comprehensive metabolic panel     Status: Abnormal   Collection Time: 09/21/18  4:28 AM  Result Value Ref Range   Sodium 133 (L) 135 - 145 mmol/L   Potassium 4.6 3.5 - 5.1 mmol/L   Chloride 92 (L) 98 - 111 mmol/L   CO2 26 22 - 32 mmol/L   Glucose, Bld 299 (H) 70 - 99 mg/dL   BUN 27 (H) 6 - 20 mg/dL   Creatinine, Ser 8.33 (H) 0.61 - 1.24 mg/dL   Calcium 8.6 (L) 8.9 - 10.3 mg/dL   Total Protein 7.5 6.5 - 8.1 g/dL   Albumin 3.4 (L) 3.5 - 5.0 g/dL   AST 11 (L) 15 - 41 U/L   ALT 11 0 - 44 U/L   Alkaline Phosphatase 79 38 - 126 U/L   Total Bilirubin 0.9 0.3 - 1.2 mg/dL   GFR calc non Af Amer 6 (L) >60 mL/min   GFR calc Af Amer 7 (L) >60 mL/min    Comment: (NOTE) The eGFR has been calculated using the CKD EPI equation. This calculation has not been validated in all clinical situations. eGFR's persistently <60 mL/min signify possible Chronic Kidney Disease.    Anion gap 15 5 - 15    Comment: Performed at [facility], [address] [place],  [address]   Glucose, capillary     Status: Abnormal   Collection Time: 09/21/18  7:27 AM  Result Value Ref Range   Glucose-Capillary 242 (H) 70 - 99 mg/dL    Dg Chest 2 View  Result Date: 09/20/2018 CLINICAL DATA:  Nausea and vomiting for 3 days. Lightheaded and dizzy. Decreased blood pressure today after dialysis. History of diabetes, hypertension, kidney failure. EXAM: CHEST - 2 VIEW COMPARISON:  None. FINDINGS: Heart size and pulmonary vascularity are normal for technique. Lungs are clear and expanded. No blunting of costophrenic angles. No pneumothorax. Mediastinal contours appear intact. Degenerative changes in the spine. IMPRESSION: No active cardiopulmonary disease. Electronically Signed   By: Lucienne Capers M.D.   On: 09/20/2018 22:03    Review of Systems  Constitutional: Negative for chills and fever.  Respiratory: Negative for cough and shortness of breath.   Cardiovascular: Negative for chest pain, orthopnea and leg swelling.  Gastrointestinal: Positive for nausea. Negative  for abdominal pain and vomiting.  Neurological: Positive for dizziness.   Blood pressure 130/68, pulse 83, temperature 100.1 F (37.8 C), temperature source Oral, resp. rate 20, height 6' 1"  (1.854 m), weight 113.6 kg, SpO2 100 %. Physical Exam  Assessment/Plan: 1] dizziness and some nausea as the end of dialysis.  Presently patient is feeling better.  He has also some fever.  Presently patient is afebrile and is asymptomatic. 2] end-stage renal disease: As stated above patient was dialyzed yesterday.  His potassium is normal and patient is asymptomatic. 3] fluid management: Patient seems to have a problem of controlling his salt and fluid intake.  He went to the dialysis unit yesterday with 7 kg.  There were able to remove about 6 kg.  Presently patient is asymptomatic.  He denies any difficulty breathing.  Chest x-ray shows no significant sign of fluid overload. 4] hypertension: His blood pressure is reasonably  controlled. 5] anemia: His hemoglobin is 12.6 which is above our target goal. 6] bone and mineral disorder: His calcium is a range but phosphorus is not available. 7] diabetes 8] fever.  Patient still has a temperature of 100.1.  His white blood cell count is normal.  Chest x-ray did not show any sign of infiltrate.  His right upper arm dialysis access seems to be without sign of inflammation.  At this moment etiology for his fever is no clear.  Blood culture result is pending. Plan: 1] patient advised to cut down his salt and fluid intake 2] patient does not need dialysis today 3] we will make arrangement for patient to get dialysis tomorrow which is his regular schedule.  If patient is going to be discharged he will get his dialysis as an outpatient.   Averyana Pillars S 09/21/2018, 8:02 AM

## 2018-09-21 NOTE — Progress Notes (Signed)
CRITICAL VALUE ALERT  Critical Value:  Lactic Acid 2.3  Date & Time Notied:  09/21/18 0050  Provider Notified: Silas [place]  Orders Received/Actions taken: No orders received at this time.

## 2018-09-21 NOTE — Care Management Note (Signed)
Case Management Note  Patient Details  Name: KOBEN DAMAN MRN: [id number] Date of Birth: Apr 25, 1961  Subjective/Objective:              Fever. From home, lives with his mother and brother. Independent. ESRD on HD.  No assistive devices. Has PCP in [facility]- Dr. Mee Hives per daughter. Patient uses transportation services to get to appointments and HD sessions. CM consulted for medications. Per daughter patient has Medicare and Medicaid. They were in an appeal for Entresto and won the appeal this morning.      Action/Plan: No CM needs. DC home.   Expected Discharge Date:    09/21/2018              Expected Discharge Plan:  Home/Self Care  In-House Referral:     Discharge planning Services  CM Consult, Medication Assistance  Post Acute Care Choice:  NA Choice offered to:  NA  DME Arranged:    DME Agency:     HH Arranged:    HH Agency:     Status of Service:  Completed, signed off  If discussed at [employer] of Stay Meetings, dates discussed:    Additional Comments:  Ailsa Mireles, Chauncey Reading, RN 09/21/2018, 11:00 AM

## 2018-09-21 NOTE — Progress Notes (Signed)
Patient expressed no immediate need at this time. However, he looking forwarding to returning home soon.

## 2018-09-21 NOTE — Progress Notes (Signed)
[facility] Progress Note  Subjective: no n/v overnight, no CP or SOB, chronic cough (" x 82yrs" ), no abd pain today.  No diarrhea, no wounds , no drainage from aVF.    Vitals:   09/20/18 2332 09/20/18 2337 09/21/18 0549 09/21/18 1435  BP: (!) 169/79  130/68 (!) 191/85  Pulse: 86  83 90  Resp: 18  20 (!) 22  Temp: 99.7 F (37.6 C)  100.1 F (37.8 C) (!) 101.1 F (38.4 C)  TempSrc: Oral  Oral Oral  SpO2: 97%  100% 100%  Weight:  113.6 kg    Height:  6\' 1"  (1.854 m)      Inpatient medications: . aspirin  81 mg Oral Daily  . atorvastatin  40 mg Oral QPM  . carvedilol  25 mg Oral BID  . Chlorhexidine Gluconate Cloth  6 each Topical Q0600  . heparin  5,000 Units Subcutaneous Q8H  . insulin aspart  0-9 Units Subcutaneous TID WC  . insulin NPH Human  5 Units Subcutaneous BID AC & HS  . pantoprazole  40 mg Oral Daily  . sevelamer carbonate  2,400 mg Oral TID WC  . sodium bicarbonate  650 mg Oral QID    acetaminophen **OR** acetaminophen, ondansetron **OR** ondansetron (ZOFRAN) IV  Exam:  alert, no distress, chron ill apearing  no jvd  chest clear bilat no rales/ wheezing  cor reg S1 S2 no RG  abd soft ntnd no mass or ascites, +bs  ext no leg or UE edema  AV fistula UE has +bruit, no signs infection  nonfocal , Ox 3   Presentation Summary: [name] [name] is a 57 y.o. male with medical history significant of ESRD dialysis MWF, DM2, HTN.  Patient was finishing up dialysis today, removed 6.3L.  Patient developed lightheadedness and one episode of vomiting.  Found to be running fever and have BP 62B systolic.  Brought in to ED.  On ROS it seems patient has been having chronic N/V to some degree.  Fever ongoing since Friday he reports.  ED Course: BP improved to 762G systolic with just 315VV LR.  Tm 101.5.  CXR neg. Tolerating POs.  LFTs nl.  LA 2.4.   HOme meds:  - amlodipine 5/ carvedilol 25 bid/ entresto 24-26mg  bid   - renvela 2.4g tid ac/ sodium bicarbonate 650 mg  qid  - aspirin 81 mg/ atrovastatin 40 hs/ pantoprazole 20 qd/ sildenafil 100 qd prn  - humulin 70/30  20 units am and 6 units pm       Hospital Problems/ Course:  Hypotension/ nausea/ vomiting / syncope - during large volume HD yesterday (6L) , no further issues overnight. Does not need syncope w/u given this was vol related on HD.    Fevers- tmax 101 F last night, blood cx's neg today, no specific complaints. Has a chronic cough x 3 yrs, CXR clear.   - cont to observe, get upper resp swab for viral illness  HTN - bp's meds held on admission - bp's back up now will resume all home BP meds  DM2 - takes 70/30 bid at home (20 am/ 6 pm)  - BS's up 200's, resume home 70/30 + SSI  ESRD - seen by nephrology for routine dialysis, has orders for HD tomorrow - cont renvela ac tid  GERD - cont protonix 20mg  PO daily.  DVT prophylaxis: Heparin [place] Code Status: Full Family Communication: Family at bedside Disposition Plan: Home when better Consults called: Nephrology for routine  IP dialysis Admission status: Placed in obs     [name] Splinter MD [facility] pgr [phone] 09/21/2018, 4:47 PM   Recent Labs  Lab 09/20/18 1902 09/21/18 0428  NA 131* 133*  K 5.1 4.6  CL 89* 92*  CO2 30 26  GLUCOSE 293* 299*  BUN 19 27*  CREATININE 7.19* 8.33*  CALCIUM 8.9 8.6*   Recent Labs  Lab 09/20/18 1902 09/21/18 0428  AST 11* 11*  ALT 15 11  ALKPHOS 99 79  BILITOT 0.8 0.9  PROT 9.0* 7.5  ALBUMIN 4.2 3.4*   Recent Labs  Lab 09/20/18 1902 09/21/18 0428  WBC 9.9 9.5  NEUTROABS 7.0  --   HGB 14.1 12.6*  HCT 44.8 39.6  MCV 95.5 97.5  PLT 185 167   Iron/TIBC/Ferritin/ %Sat No results found for: IRON, TIBC, FERRITIN, IRONPCTSAT

## 2018-09-22 DIAGNOSIS — I5022 Chronic systolic (congestive) heart failure: Secondary | ICD-10-CM | POA: Diagnosis present

## 2018-09-22 DIAGNOSIS — E785 Hyperlipidemia, unspecified: Secondary | ICD-10-CM | POA: Diagnosis present

## 2018-09-22 DIAGNOSIS — I2510 Atherosclerotic heart disease of native coronary artery without angina pectoris: Secondary | ICD-10-CM | POA: Diagnosis present

## 2018-09-22 DIAGNOSIS — R112 Nausea with vomiting, unspecified: Secondary | ICD-10-CM | POA: Diagnosis present

## 2018-09-22 DIAGNOSIS — E1122 Type 2 diabetes mellitus with diabetic chronic kidney disease: Secondary | ICD-10-CM | POA: Diagnosis present

## 2018-09-22 DIAGNOSIS — Z992 Dependence on renal dialysis: Secondary | ICD-10-CM | POA: Diagnosis not present

## 2018-09-22 DIAGNOSIS — D631 Anemia in chronic kidney disease: Secondary | ICD-10-CM | POA: Diagnosis present

## 2018-09-22 DIAGNOSIS — Z7982 Long term (current) use of aspirin: Secondary | ICD-10-CM | POA: Diagnosis not present

## 2018-09-22 DIAGNOSIS — E871 Hypo-osmolality and hyponatremia: Secondary | ICD-10-CM | POA: Diagnosis present

## 2018-09-22 DIAGNOSIS — R42 Dizziness and giddiness: Secondary | ICD-10-CM | POA: Diagnosis present

## 2018-09-22 DIAGNOSIS — Z794 Long term (current) use of insulin: Secondary | ICD-10-CM | POA: Diagnosis not present

## 2018-09-22 DIAGNOSIS — R509 Fever, unspecified: Secondary | ICD-10-CM | POA: Diagnosis not present

## 2018-09-22 DIAGNOSIS — K219 Gastro-esophageal reflux disease without esophagitis: Secondary | ICD-10-CM | POA: Diagnosis present

## 2018-09-22 DIAGNOSIS — I447 Left bundle-branch block, unspecified: Secondary | ICD-10-CM | POA: Diagnosis present

## 2018-09-22 DIAGNOSIS — I953 Hypotension of hemodialysis: Secondary | ICD-10-CM | POA: Diagnosis present

## 2018-09-22 DIAGNOSIS — N186 End stage renal disease: Secondary | ICD-10-CM | POA: Diagnosis present

## 2018-09-22 DIAGNOSIS — Z79899 Other long term (current) drug therapy: Secondary | ICD-10-CM | POA: Diagnosis not present

## 2018-09-22 DIAGNOSIS — I132 Hypertensive heart and chronic kidney disease with heart failure and with stage 5 chronic kidney disease, or end stage renal disease: Secondary | ICD-10-CM | POA: Diagnosis present

## 2018-09-22 DIAGNOSIS — I428 Other cardiomyopathies: Secondary | ICD-10-CM | POA: Diagnosis present

## 2018-09-22 DIAGNOSIS — B349 Viral infection, unspecified: Secondary | ICD-10-CM | POA: Diagnosis present

## 2018-09-22 DIAGNOSIS — M898X9 Other specified disorders of bone, unspecified site: Secondary | ICD-10-CM | POA: Diagnosis present

## 2018-09-22 DIAGNOSIS — R05 Cough: Secondary | ICD-10-CM | POA: Diagnosis present

## 2018-09-22 LAB — GLUCOSE, CAPILLARY
GLUCOSE-CAPILLARY: 251 mg/dL — AB (ref 70–99)
Glucose-Capillary: 292 mg/dL — ABNORMAL HIGH (ref 70–99)
Glucose-Capillary: 249 mg/dL — ABNORMAL HIGH (ref 70–99)
Glucose-Capillary: 157 mg/dL — ABNORMAL HIGH (ref 70–99)

## 2018-09-22 LAB — PROCALCITONIN: Procalcitonin: 1.32 ng/mL

## 2018-09-22 LAB — RESPIRATORY PANEL BY PCR
Adenovirus: NOT DETECTED
BORDETELLA PERTUSSIS-RVPCR: NOT DETECTED
CORONAVIRUS 229E-RVPPCR: NOT DETECTED
CORONAVIRUS NL63-RVPPCR: NOT DETECTED
Chlamydophila pneumoniae: NOT DETECTED
Coronavirus HKU1: NOT DETECTED
Coronavirus OC43: NOT DETECTED
INFLUENZA B-RVPPCR: NOT DETECTED
Influenza A: NOT DETECTED
Metapneumovirus: NOT DETECTED
Mycoplasma pneumoniae: NOT DETECTED
PARAINFLUENZA VIRUS 2-RVPPCR: NOT DETECTED
Parainfluenza Virus 1: NOT DETECTED
Parainfluenza Virus 3: NOT DETECTED
Parainfluenza Virus 4: NOT DETECTED
Respiratory Syncytial Virus: NOT DETECTED
Rhinovirus / Enterovirus: NOT DETECTED

## 2018-09-22 LAB — URINE CULTURE: CULTURE: NO GROWTH

## 2018-09-22 LAB — BASIC METABOLIC PANEL
Anion gap: 14 (ref 5–15)
BUN: 41 mg/dL — AB (ref 6–20)
CHLORIDE: 91 mmol/L — AB (ref 98–111)
CO2: 28 mmol/L (ref 22–32)
CREATININE: 11.28 mg/dL — AB (ref 0.61–1.24)
Calcium: 8.7 mg/dL — ABNORMAL LOW (ref 8.9–10.3)
GFR calc Af Amer: 5 mL/min — ABNORMAL LOW (ref >60)
GFR, EST NON AFRICAN AMERICAN: 4 mL/min — AB (ref >60)
Glucose, Bld: 276 mg/dL — ABNORMAL HIGH (ref 70–99)
POTASSIUM: 4.4 mmol/L (ref 3.5–5.1)
SODIUM: 133 mmol/L — AB (ref 135–145)

## 2018-09-22 LAB — CBC
HCT: 41 % (ref 39.0–52.0)
HEMOGLOBIN: 12.8 g/dL — AB (ref 13.0–17.0)
MCH: 30.4 pg (ref 26.0–34.0)
MCHC: 31.2 g/dL (ref 30.0–36.0)
MCV: 97.4 fL (ref 80.0–100.0)
Platelets: 165 10*3/uL (ref 150–400)
RBC: 4.21 MIL/uL — ABNORMAL LOW (ref 4.22–5.81)
RDW: 16 % — ABNORMAL HIGH (ref 11.5–15.5)
WBC: 10.8 10*3/uL — ABNORMAL HIGH (ref 4.0–10.5)
nRBC: 0 % (ref 0.0–0.2)

## 2018-09-22 LAB — HIV ANTIBODY (ROUTINE TESTING W REFLEX): HIV Screen 4th Generation wRfx: NONREACTIVE

## 2018-09-22 MED ORDER — LIDOCAINE HCL (PF) 1 % IJ SOLN: 5.0000 mL | INTRAMUSCULAR | Status: DC | PRN

## 2018-09-22 MED ORDER — LIDOCAINE-PRILOCAINE 2.5-2.5 % EX CREA: 1.0000 "application " | TOPICAL_CREAM | CUTANEOUS | Status: DC | PRN

## 2018-09-22 MED ORDER — SODIUM CHLORIDE 0.9 % IV SOLN: 100.0000 mL | INTRAVENOUS | Status: DC | PRN

## 2018-09-22 MED ORDER — PENTAFLUOROPROP-TETRAFLUOROETH EX AERO
1.0000 "application " | INHALATION_SPRAY | CUTANEOUS | Status: DC | PRN
  Administered 2018-09-24: 1 via TOPICAL
  Filled 2018-09-22: qty 116

## 2018-09-22 NOTE — Progress Notes (Signed)
Patient undergoing hemodialysis and not eating lunch right now so I held insulin and Renvela.

## 2018-09-22 NOTE — Progress Notes (Signed)
[name] [name]  MRN: [id number]  DOB/AGE: 1961-08-03 57 y.o.  Primary Care Physician:Patient, No Pcp Per  Admit date: 09/20/2018  Chief Complaint:  Chief Complaint  Patient presents with  . Emesis    S-Pt presented on  09/20/2018 with  Chief Complaint  Patient presents with  . Emesis  .    Pt offers no new complaints.   Meds . amLODipine  5 mg Oral Daily  . aspirin  81 mg Oral Daily  . atorvastatin  40 mg Oral QPM  . carvedilol  25 mg Oral BID  . Chlorhexidine Gluconate Cloth  6 each Topical Q0600  . heparin  5,000 Units Subcutaneous Q8H  . insulin aspart  0-9 Units Subcutaneous TID WC  . insulin aspart protamine- aspart  20 Units Subcutaneous Q breakfast  . insulin aspart protamine- aspart  6 Units Subcutaneous Q supper  . pantoprazole  40 mg Oral Daily  . sacubitril-valsartan  1 tablet Oral BID  . sevelamer carbonate  2,400 mg Oral TID WC  . sodium bicarbonate  650 mg Oral QID      Physical Exam: Vital signs in last 24 hours: Temp:  [99.5 F (37.5 C)-102.4 F (39.1 C)] 99.5 F (37.5 C) (11/06 0629) Pulse Rate:  [62-90] 81 (11/06 0629) Resp:  [18-22] 18 (11/06 0629) BP: (134-191)/(64-85) 134/64 (11/06 0629) SpO2:  [70 %-100 %] 100 % (11/06 0629) Weight change:  Last BM Date: 09/20/18  Intake/Output from previous day: 11/05 0701 - 11/06 0700 In: 1200 [P.O.:1200] Out: 5 [Urine:4; Stool:1] No intake/output data recorded.   Physical Exam: General- pt is awake,alert, oriented to time place and person Resp- No acute REsp distress, CTA B/L NO Rhonchi CVS- S1S2 regular in rate and rhythm GIT- BS+, soft, NT, ND EXT- NO LE Edema, No Cyanosis Access- Left AVF + Bruit   Lab Results: CBC Recent Labs    09/21/18 0428 09/22/18 0453  WBC 9.5 10.8*  HGB 12.6* 12.8*  HCT 39.6 41.0  PLT 167 165    BMET Recent Labs    09/21/18 0428 09/22/18 0453  NA 133* 133*  K 4.6 4.4  CL 92* 91*  CO2 26 28  GLUCOSE 299* 276*  BUN 27* 41*  CREATININE 8.33*  11.28*  CALCIUM 8.6* 8.7*    MICRO Recent Results (from the past 240 hour(s))  Blood culture (routine x 2)     Status: None (Preliminary result)   Collection Time: 09/20/18 10:56 PM  Result Value Ref Range Status   Specimen Description BLOOD RIGHT HAND  Final   Special Requests   Final    BOTTLES DRAWN AEROBIC AND ANAEROBIC Blood Culture adequate volume   Culture   Final    NO GROWTH 2 DAYS Performed at [facility], [address] [place], [place] [address]    Report Status PENDING  Incomplete  Blood culture (routine x 2)     Status: None (Preliminary result)   Collection Time: 09/20/18 11:07 PM  Result Value Ref Range Status   Specimen Description RIGHT ANTECUBITAL  Final   Special Requests   Final    BOTTLES DRAWN AEROBIC AND ANAEROBIC Blood Culture adequate volume   Culture   Final    NO GROWTH 2 DAYS Performed at [facility], [address], [place], [place] [address]    Report Status PENDING  Incomplete      Lab Results  Component Value Date   CALCIUM 8.7 (L) 09/22/2018  Impression: 1)Renal  ESRD on HD                Pt is on Monday/Wednesday/Friday schedule                Pt will be dialyzed today  2)HTN  Medication- On RAS blockers On Calcium Channel Blockers On Alpha and beta Blockers.  3)Anemia In ESRD the goal for HGb is 9--11.( on ESA) NO need for ESA/Epo Hgb stable  4)CKD Mineral-Bone Disorder  Hx of high Phosphorus. Pt is on binders  Calcium when corrected for low albumin isat goal.  5)Fever-admitted with fever Primary MD following  6)Electrolytes Normokalemic Hyponatremic   Sec to ESRD  7)Acid base Co2 at goal  8)CNS-admitted with syncope Possibly from volume removal Primary team following   Plan:  Will dialyze today     Cosimo Schertzer S 09/22/2018, 9:19 AM

## 2018-09-22 NOTE — Procedures (Signed)
HEMODIALYSIS TREATMENT NOTE:  4 hour heparin-free dialysis completed via left upper arm AVF (15g/antegrade). Goal met: 2.5 liters removed without interruption in ultrafiltration.  All blood was returned and hemostasis was achieved in 15 minutes.  Post-HD temp 100.2.   Rockwell Alexandria, RN

## 2018-09-22 NOTE — Progress Notes (Signed)
Patient's daughter, Patsey Berthold, was on the phone when I entered room. Both patient and daughter asked for the doctor to give her an update. I paged the doctor and asked him to call her. I also added her to his contacts in his chart

## 2018-09-22 NOTE — Progress Notes (Addendum)
PROGRESS NOTE    [name] [name]  TFT:[id number] DOB: [birth year] DOA: 09/20/2018 PCP: Patient, No Pcp Per   Brief Narrative:  [name] Dove Priceis a 57 y.o.malewith medical history significant ofESRD dialysis MWF, DM2, HTN.  Patient was finishing up dialysis today, removed 6.3L. Patient developed lightheadedness and one episode of vomiting. Found to be running fever and have BP 70W systolic. Brought in to ED.  On ROS it seems patient has been having chronic N/V to some degree. Fever ongoing since Friday he reports.  ED Course:BP improved to 237S systolic with just 283TD LR. Tm 101.5. CXR neg. Tolerating POs. LFTs nl. LA 2.4.  Patient has been admitted with presyncopal episode along with some nausea and vomiting and fever with no other specific complaints noted.  Respiratory swab for viral illness currently pending and blood cultures to date have shown no growth in the last 2 days.  Patient denies any other symptomatic complaints or concerns today and is awaiting further hemodialysis this morning.   Assessment & Plan:   Principal Problem:   Fever Active Problems:   ESRD (end stage renal disease) (HCC)   Nausea & vomiting   DM2 (diabetes mellitus, type 2) (HCC)   HTN (hypertension)   1. Presyncope with associated hypotension, nausea, and vomiting.  This was following large-volume hemodialysis and patient has had no further related episodes.  Trial of hemodialysis today per nephrology.  Patient has been started back on usual blood pressure medications and is tolerating diet this morning with no additional complaints or concerns. 2. Persistent fever.  T-max of 102.62F overnight with blood cultures demonstrating no growth in the past 2 days and clear chest x-ray despite persistent cough for the last 3 years.  Respiratory panel currently pending.  I have also ordered procalcitonin, repeat blood cultures given recurrent fever, as well as fungal blood cultures given hemodialysis.  Patient was  also noted to recently be in prison and therefore, I will check TB quantiferon gold. HIV noted to be non-reactive. 3. Hypertension.  Well-controlled on current medications.  Continue to monitor. 4. Type 2 diabetes.  Continue home 70/30 as well as sliding scale insulin.  Blood glucose still in the mid 200s and may increase this further as needed. 5. ESRD on HD.  Continue Renvela AC 3 times daily and noted plans for HD today. 6. GERD.  Continue Protonix daily.   DVT prophylaxis: Heparin Code Status: Full Family Communication: Spoke with daughter on phone Patsey Berthold [phone] who would appreciate daily updates Disposition Plan: Monitor repeat blood cultures as well as fungal culture and await respiratory panel.  Ensure that patient is without fever for over 24 hours with no further culture growth prior to discharge.  Hemodialysis today.   Consultants:   Nephrology  Procedures:   None  Antimicrobials:   None   Subjective: Patient seen and evaluated today with no new acute complaints or concerns. No acute concerns or events noted overnight.  Objective: Vitals:   09/21/18 1435 09/21/18 2121 09/21/18 2352 09/22/18 0629  BP: (!) 191/85 (!) 156/80  134/64  Pulse: 90 62  81  Resp: (!) 22 19  18   Temp: (!) 101.1 F (38.4 C) (!) 102.4 F (39.1 C) (!) 100.9 F (38.3 C) 99.5 F (37.5 C)  TempSrc: Oral Oral Oral Oral  SpO2: 100% (!) 70% 100% 100%  Weight:      Height:        Intake/Output Summary (Last 24 hours) at 09/22/2018 1029 Last data filed at  09/22/2018 0500 Gross per 24 hour  Intake 960 ml  Output 4 ml  Net 956 ml   Filed Weights   09/20/18 1750 09/20/18 2337  Weight: 116.1 kg 113.6 kg    Examination:  General exam: Appears calm and comfortable  Respiratory system: Clear to auscultation. Respiratory effort normal. Cardiovascular system: S1 & S2 heard, RRR. No JVD, murmurs, rubs, gallops or clicks. No pedal edema. Gastrointestinal system: Abdomen is  nondistended, soft and nontender. No organomegaly or masses felt. Normal bowel sounds heard. Central nervous system: Alert and oriented. No focal neurological deficits. Extremities: Symmetric 5 x 5 power. Skin: No rashes, lesions or ulcers Psychiatry: Judgement and insight appear normal. Mood & affect appropriate.     Data Reviewed: I have personally reviewed following labs and imaging studies  CBC: Recent Labs  Lab 09/20/18 1902 09/21/18 0428 09/22/18 0453  WBC 9.9 9.5 10.8*  NEUTROABS 7.0  --   --   HGB 14.1 12.6* 12.8*  HCT 44.8 39.6 41.0  MCV 95.5 97.5 97.4  PLT 185 167 786   Basic Metabolic Panel: Recent Labs  Lab 09/20/18 1902 09/21/18 0428 09/22/18 0453  NA 131* 133* 133*  K 5.1 4.6 4.4  CL 89* 92* 91*  CO2 30 26 28   GLUCOSE 293* 299* 276*  BUN 19 27* 41*  CREATININE 7.19* 8.33* 11.28*  CALCIUM 8.9 8.6* 8.7*   GFR: Estimated Creatinine Clearance: 9.8 mL/min (A) (by C-G formula based on SCr of 11.28 mg/dL (H)). Liver Function Tests: Recent Labs  Lab 09/20/18 1902 09/21/18 0428  AST 11* 11*  ALT 15 11  ALKPHOS 99 79  BILITOT 0.8 0.9  PROT 9.0* 7.5  ALBUMIN 4.2 3.4*   Recent Labs  Lab 09/20/18 1902  LIPASE 56*   No results for input(s): AMMONIA in the last 168 hours. Coagulation Profile: No results for input(s): INR, PROTIME in the last 168 hours. Cardiac Enzymes: No results for input(s): CKTOTAL, CKMB, CKMBINDEX, TROPONINI in the last 168 hours. BNP (last 3 results) No results for input(s): PROBNP in the last 8760 hours. HbA1C: No results for input(s): HGBA1C in the last 72 hours. CBG: Recent Labs  Lab 09/21/18 0727 09/21/18 1111 09/21/18 1608 09/21/18 2120 09/22/18 0742  GLUCAP 242* 274* 248* 274* 251*   Lipid Profile: No results for input(s): CHOL, HDL, LDLCALC, TRIG, CHOLHDL, LDLDIRECT in the last 72 hours. Thyroid Function Tests: No results for input(s): TSH, T4TOTAL, FREET4, T3FREE, THYROIDAB in the last 72 hours. Anemia  Panel: No results for input(s): VITAMINB12, FOLATE, FERRITIN, TIBC, IRON, RETICCTPCT in the last 72 hours. Sepsis Labs: Recent Labs  Lab 09/20/18 2154 09/20/18 2308  LATICACIDVEN 2.4* 2.3*    Recent Results (from the past 240 hour(s))  Blood culture (routine x 2)     Status: None (Preliminary result)   Collection Time: 09/20/18 10:56 PM  Result Value Ref Range Status   Specimen Description BLOOD RIGHT HAND  Final   Special Requests   Final    BOTTLES DRAWN AEROBIC AND ANAEROBIC Blood Culture adequate volume   Culture   Final    NO GROWTH 2 DAYS Performed at  [facility], [address], [place], [place] [address]    Report Status PENDING  Incomplete  Blood culture (routine x 2)     Status: None (Preliminary result)   Collection Time: 09/20/18 11:07 PM  Result Value Ref Range Status   Specimen Description RIGHT ANTECUBITAL  Final   Special Requests   Final    BOTTLES  DRAWN AEROBIC AND ANAEROBIC Blood Culture adequate volume   Culture   Final    NO GROWTH 2 DAYS Performed at [facility], [address] [place], [place] [address]    Report Status PENDING  Incomplete         Radiology Studies: Dg Chest 2 View  Result Date: 09/20/2018 CLINICAL DATA:  Nausea and vomiting for 3 days. Lightheaded and dizzy. Decreased blood pressure today after dialysis. History of diabetes, hypertension, kidney failure. EXAM: CHEST - 2 VIEW COMPARISON:  None. FINDINGS: Heart size and pulmonary vascularity are normal for technique. Lungs are clear and expanded. No blunting of costophrenic angles. No pneumothorax. Mediastinal contours appear intact. Degenerative changes in the spine. IMPRESSION: No active cardiopulmonary disease. Electronically Signed   By: Lucienne Capers M.D.   On: 09/20/2018 22:03        Scheduled Meds: . amLODipine  5 mg Oral Daily  . aspirin  81 mg Oral Daily  . atorvastatin  40 mg Oral QPM  . carvedilol  25 mg Oral BID  . Chlorhexidine Gluconate Cloth  6 each  Topical Q0600  . heparin  5,000 Units Subcutaneous Q8H  . insulin aspart  0-9 Units Subcutaneous TID WC  . insulin aspart protamine- aspart  20 Units Subcutaneous Q breakfast  . insulin aspart protamine- aspart  6 Units Subcutaneous Q supper  . pantoprazole  40 mg Oral Daily  . sacubitril-valsartan  1 tablet Oral BID  . sevelamer carbonate  2,400 mg Oral TID WC  . sodium bicarbonate  650 mg Oral QID   Continuous Infusions: . sodium chloride    . sodium chloride       LOS: 0 days    Time spent: 30 minutes    Mihran Lebarron Darleen Crocker, DO [facility] Pager [phone]  If 7PM-7AM, please contact night-coverage [email or web address] Password [email or web address] 09/22/2018, 10:29 AM

## 2018-09-23 ENCOUNTER — Inpatient Hospital Stay (HOSPITAL_COMMUNITY): Payer: Medicare Other

## 2018-09-23 DIAGNOSIS — R509 Fever, unspecified: Secondary | ICD-10-CM

## 2018-09-23 LAB — BASIC METABOLIC PANEL
ANION GAP: 15 (ref 5–15)
BUN: 30 mg/dL — AB (ref 6–20)
CALCIUM: 8.7 mg/dL — AB (ref 8.9–10.3)
CO2: 26 mmol/L (ref 22–32)
CREATININE: 9.11 mg/dL — AB (ref 0.61–1.24)
Chloride: 93 mmol/L — ABNORMAL LOW (ref 98–111)
GFR calc Af Amer: 7 mL/min — ABNORMAL LOW (ref >60)
GFR, EST NON AFRICAN AMERICAN: 6 mL/min — AB (ref >60)
Glucose, Bld: 201 mg/dL — ABNORMAL HIGH (ref 70–99)
POTASSIUM: 4.6 mmol/L (ref 3.5–5.1)
Sodium: 134 mmol/L — ABNORMAL LOW (ref 135–145)

## 2018-09-23 LAB — GLUCOSE, CAPILLARY
GLUCOSE-CAPILLARY: 221 mg/dL — AB (ref 70–99)
Glucose-Capillary: 214 mg/dL — ABNORMAL HIGH (ref 70–99)
Glucose-Capillary: 180 mg/dL — ABNORMAL HIGH (ref 70–99)
Glucose-Capillary: 138 mg/dL — ABNORMAL HIGH (ref 70–99)

## 2018-09-23 LAB — CBC
HEMATOCRIT: 39.8 % (ref 39.0–52.0)
Hemoglobin: 12.8 g/dL — ABNORMAL LOW (ref 13.0–17.0)
MCH: 31 pg (ref 26.0–34.0)
MCHC: 32.2 g/dL (ref 30.0–36.0)
MCV: 96.4 fL (ref 80.0–100.0)
NRBC: 0 % (ref 0.0–0.2)
Platelets: 174 10*3/uL (ref 150–400)
RBC: 4.13 MIL/uL — ABNORMAL LOW (ref 4.22–5.81)
RDW: 16.1 % — ABNORMAL HIGH (ref 11.5–15.5)
WBC: 8.8 10*3/uL (ref 4.0–10.5)

## 2018-09-23 LAB — PROCALCITONIN: Procalcitonin: 1.66 ng/mL

## 2018-09-23 MED ORDER — GUAIFENESIN 100 MG/5ML PO SOLN
5.0000 mL | ORAL | Status: DC | PRN
  Administered 2018-09-23 – 2018-09-24 (×3): 100 mg via ORAL
  Filled 2018-09-23 (×3): qty 5

## 2018-09-23 MED ORDER — IOPAMIDOL (ISOVUE-300) INJECTION 61%
30.0000 mL | Freq: Once | INTRAVENOUS | Status: AC | PRN
  Administered 2018-09-23: 30 mL via ORAL

## 2018-09-23 MED ORDER — IOPAMIDOL (ISOVUE-300) INJECTION 61%
125.0000 mL | Freq: Once | INTRAVENOUS | Status: AC | PRN
  Administered 2018-09-23: 18:00:00 via INTRAVENOUS

## 2018-09-23 NOTE — Progress Notes (Signed)
Inpatient Diabetes Program Recommendations  AACE/ADA: New Consensus Statement on Inpatient Glycemic Control (2019)  Target Ranges:  Prepandial:   less than 140 mg/dL      Peak postprandial:   less than 180 mg/dL (1-2 hours)      Critically ill patients:  140 - 180 mg/dL   Results for [name] [name], [name] [name] (MRN [id number]) as of 09/23/2018 08:02  Ref. Range 09/22/2018 07:42 09/22/2018 11:20 09/22/2018 16:26 09/22/2018 21:44 09/23/2018 07:40  Glucose-Capillary Latest Ref Range: 70 - 99 mg/dL 251 (H) 292 (H) 249 (H) 157 (H) 214 (H)   Review of Glycemic Control  Outpatient Diabetes medications: 70/30 20 units QAM, 70/30 6 units QPM Current orders for Inpatient glycemic control: 70/30 20 units QAM, 70/30 6 units QPM, Novolog 0-9 units TID with meals  Inpatient Diabetes Program Recommendations:  Insulin - Basal: Please consider increasing 70/30 to 25 units QAM and 8 units QPM.  Thanks, Barnie Alderman, RN, MSN, CDE Diabetes Coordinator Inpatient Diabetes Program [phone] (Team Pager from 8am to 5pm)

## 2018-09-23 NOTE — Progress Notes (Signed)
Subjective: Interval History: has no complaint of difficulty breathing.  He has occasional cough no sputum production.  Patient denies any nausea or vomiting.  Overall he feels good..  Objective: Vital signs in last 24 hours: Temp:  [98.7 F (37.1 C)-101.3 F (38.5 C)] 98.7 F (37.1 C) (11/07 0505) Pulse Rate:  [73-86] 76 (11/07 0505) Resp:  [16-20] 18 (11/07 0505) BP: (84-142)/(53-76) 125/76 (11/07 0505) SpO2:  [96 %-100 %] 96 % (11/07 0505) Weight:  [638 kg-120.4 kg] 117 kg (11/06 1535) Weight change:   Intake/Output from previous day: 11/06 0701 - 11/07 0700 In: 120 [P.O.:120] Out: 2500  Intake/Output this shift: No intake/output data recorded.  General appearance: alert, cooperative and no distress Resp: clear to auscultation bilaterally Cardio: regular rate and rhythm Extremities: No edema  Lab Results: Recent Labs    09/22/18 0453 09/23/18 0458  WBC 10.8* 8.8  HGB 12.8* 12.8*  HCT 41.0 39.8  PLT 165 174   BMET:  Recent Labs    09/22/18 0453 09/23/18 0458  NA 133* 134*  K 4.4 4.6  CL 91* 93*  CO2 28 26  GLUCOSE 276* 201*  BUN 41* 30*  CREATININE 11.28* 9.11*  CALCIUM 8.7* 8.7*   No results for input(s): PTH in the last 72 hours. Iron Studies: No results for input(s): IRON, TIBC, TRANSFERRIN, FERRITIN in the last 72 hours.  Studies/Results: No results found.  I have reviewed the patient's current medications.  Assessment/Plan: 1] fever: Etiology not clear.  Patient has still fever this morning.  His white blood cell count is normal.  Culture is pending.  Source of the fever at this moment is no clear. 2] end-stage renal disease: He is status post hemodialysis yesterday.  Potassium is normal and patient does not have any uremic signs and symptoms. 3] anemia: His hemoglobin is 12.8 and that is above her baseline.  Epogen is on hold. 4] hypertension: His blood pressure is reasonably controlled 5] history of diabetes 6] bone and mineral disorder:  Calcium is a range his phosphorus is not done. 7] fluid management.  Patient does not have any sign of fluid overload.  When able to remove about 2-1/2 L yesterday with dialysis. Plan: 1] patient does not require dialysis today 2] we will dialyze him for 4 hours tomorrow and try to remove about 2-1/2 L. 3] we will check his renal panel in the morning.  LOS: 1 day   Analayah Brooke S 09/23/2018,9:57 AM

## 2018-09-23 NOTE — Progress Notes (Signed)
PROGRESS NOTE    [name] [name]  YYQ:[id number] DOB: Feb 15, 1961 DOA: 09/20/2018 PCP: Patient, No Pcp Per   Brief Narrative: 57 year old with past medical history relevant for ESRD on Monday/Wednesday/Friday dialysis, type 2 diabetes on insulin, hypertension, hyperlipidemia, nonischemic cardiomyopathy who presented during dialysis with fever without a clear source.   Assessment & Plan:   Principal Problem:   Fever Active Problems:   ESRD (end stage renal disease) (HCC)   Nausea & vomiting   DM2 (diabetes mellitus, type 2) (HCC)   HTN (hypertension)   #) Fever that is worse: His procalcitonin has remained elevated.  He has nothing on his chest x-ray.  He has no localizing signs or symptoms on exam.  He did complain of nausea and vomiting but this reportedly was a somewhat chronic problem. - Respiratory virus panel negative - Blood cultures from 09/22/2018 no growth to date - Blood cultures from 09/20/2018 no growth to date -Urine culture from 09/20/2018- -We will hold on empiric antibiotics at this time as patient looks fairly well -CT abdomen pelvis with contrast ordered  #) ESRD on Monday/Wednesday/Friday dialysis: [facility]- nephrology consult -Continue sevelamer 3 times a day with meals -Continue sodium bicarbonate tablets 4 times daily  #) Type 2 diabetes: - Continue 70/3020 units every morning and 6 units nightly -Diabetic diet -Sliding scale insulin, SHS  #) Hypertension/hyperlipidemia: -Continue carvedilol 25 mg twice daily - Hold amlodipine 5 mg -Continue atorvastatin 40 mg daily -Continue aspirin 81 mg  #) Chronic nonischemic CHF: Per review of the chart patient did have an echo in [place] at the beginning of 2019 that showed an EF of 35 to 40% with a cardiac catheterization showing no significant coronary artery disease. - Recent continue beta-blocker -Restart home sacubitril/valsartan  Fluids: Restricted Electro lites: Monitor and supplement Nutrition:  Renal/carb restricted diet  Prophylaxis: Subcu heparin  Disposition: Pending evaluation of fever  Full code  Consultants:   Nephrology  Procedures:   none  Antimicrobials:  none    Subjective: Patient reports he is feeling fairly well.  He denies any nausea, vomiting, diarrhea, cough, congestion, rhinorrhea.  He reports that he first presented he had nausea and vomiting.  Objective: Vitals:   09/22/18 1525 09/22/18 1535 09/22/18 2134 09/23/18 0505  BP: 121/65 136/71 125/60 125/76  Pulse: 81 84 86 76  Resp:  16 20 18   Temp:  100.2 F (37.9 C) (!) 101.3 F (38.5 C) 98.7 F (37.1 C)  TempSrc:  Oral Oral Oral  SpO2:  100% 96% 96%  Weight:  117 kg    Height:        Intake/Output Summary (Last 24 hours) at 09/23/2018 1102 Last data filed at 09/23/2018 0500 Gross per 24 hour  Intake 120 ml  Output 2500 ml  Net -2380 ml   Filed Weights   09/20/18 2337 09/22/18 1115 09/22/18 1535  Weight: 113.6 kg 120.4 kg 117 kg    Examination:  General exam: Appears calm and comfortable  Respiratory system: Clear to auscultation. Respiratory effort normal. Cardiovascular system: Regular rate and rhythm, 2 out of 6 systolic referred murmur Gastrointestinal system: Abdomen is nondistended, soft and nontender. No organomegaly or masses felt. Normal bowel sounds heard. Central nervous system: Alert and oriented.  Is intact, moving all extremities Extremities: Trace lower extremity edema, left upper extremity fistula site with palpable thrill and audible bruit Skin: No rashes over visible skin Psychiatry: Judgement and insight appear normal. Mood & affect appropriate.     Data Reviewed: I have  personally reviewed following labs and imaging studies  CBC: Recent Labs  Lab 09/20/18 1902 09/21/18 0428 09/22/18 0453 09/23/18 0458  WBC 9.9 9.5 10.8* 8.8  NEUTROABS 7.0  --   --   --   HGB 14.1 12.6* 12.8* 12.8*  HCT 44.8 39.6 41.0 39.8  MCV 95.5 97.5 97.4 96.4  PLT 185 167  165 161   Basic Metabolic Panel: Recent Labs  Lab 09/20/18 1902 09/21/18 0428 09/22/18 0453 09/23/18 0458  NA 131* 133* 133* 134*  K 5.1 4.6 4.4 4.6  CL 89* 92* 91* 93*  CO2 30 26 28 26   GLUCOSE 293* 299* 276* 201*  BUN 19 27* 41* 30*  CREATININE 7.19* 8.33* 11.28* 9.11*  CALCIUM 8.9 8.6* 8.7* 8.7*   GFR: Estimated Creatinine Clearance: 12.3 mL/min (A) (by C-G formula based on SCr of 9.11 mg/dL (H)). Liver Function Tests: Recent Labs  Lab 09/20/18 1902 09/21/18 0428  AST 11* 11*  ALT 15 11  ALKPHOS 99 79  BILITOT 0.8 0.9  PROT 9.0* 7.5  ALBUMIN 4.2 3.4*   Recent Labs  Lab 09/20/18 1902  LIPASE 56*   No results for input(s): AMMONIA in the last 168 hours. Coagulation Profile: No results for input(s): INR, PROTIME in the last 168 hours. Cardiac Enzymes: No results for input(s): CKTOTAL, CKMB, CKMBINDEX, TROPONINI in the last 168 hours. BNP (last 3 results) No results for input(s): PROBNP in the last 8760 hours. HbA1C: No results for input(s): HGBA1C in the last 72 hours. CBG: Recent Labs  Lab 09/22/18 0742 09/22/18 1120 09/22/18 1626 09/22/18 2144 09/23/18 0740  GLUCAP 251* 292* 249* 157* 214*   Lipid Profile: No results for input(s): CHOL, HDL, LDLCALC, TRIG, CHOLHDL, LDLDIRECT in the last 72 hours. Thyroid Function Tests: No results for input(s): TSH, T4TOTAL, FREET4, T3FREE, THYROIDAB in the last 72 hours. Anemia Panel: No results for input(s): VITAMINB12, FOLATE, FERRITIN, TIBC, IRON, RETICCTPCT in the last 72 hours. Sepsis Labs: Recent Labs  Lab 09/20/18 2154 09/20/18 2308 09/22/18 1159 09/23/18 0458  PROCALCITON  --   --  1.32 1.66  LATICACIDVEN 2.4* 2.3*  --   --     Recent Results (from the past 240 hour(s))  Culture, Urine     Status: None   Collection Time: 09/20/18  9:18 PM  Result Value Ref Range Status   Specimen Description   Final    URINE, RANDOM Performed at [facility], [address] [place], [place] [address]     Special Requests   Final    NONE Performed at [facility], [address] [place], [place] [address]    Culture   Final    NO GROWTH Performed at [facility], [place] [address], [place], [place] [address]    Report Status 09/22/2018 FINAL  Final  Blood culture (routine x 2)     Status: None (Preliminary result)   Collection Time: 09/20/18 10:56 PM  Result Value Ref Range Status   Specimen Description BLOOD RIGHT HAND  Final   Special Requests   Final    BOTTLES DRAWN AEROBIC AND ANAEROBIC Blood Culture adequate volume   Culture   Final    NO GROWTH 3 DAYS Performed at [facility], [address] [place],  [address]    Report Status PENDING  Incomplete  Blood culture (routine x 2)     Status: None (Preliminary result)   Collection Time: 09/20/18 11:07 PM  Result Value Ref Range Status   Specimen Description  RIGHT ANTECUBITAL  Final   Special Requests   Final    BOTTLES DRAWN AEROBIC AND ANAEROBIC Blood Culture adequate volume   Culture   Final    NO GROWTH 3 DAYS Performed at [facility], [address], [place], [place] [address]    Report Status PENDING  Incomplete  Respiratory Panel by PCR     Status: None   Collection Time: 09/21/18  8:10 PM  Result Value Ref Range Status   Adenovirus NOT DETECTED NOT DETECTED Final   Coronavirus 229E NOT DETECTED NOT DETECTED Final   Coronavirus HKU1 NOT DETECTED NOT DETECTED Final   Coronavirus NL63 NOT DETECTED NOT DETECTED Final   Coronavirus OC43 NOT DETECTED NOT DETECTED Final   Metapneumovirus NOT DETECTED NOT DETECTED Final   Rhinovirus / Enterovirus NOT DETECTED NOT DETECTED Final   Influenza A NOT DETECTED NOT DETECTED Final   Influenza B NOT DETECTED NOT DETECTED Final   Parainfluenza Virus 1 NOT DETECTED NOT DETECTED Final   Parainfluenza Virus 2 NOT DETECTED NOT DETECTED Final   Parainfluenza Virus 3 NOT DETECTED NOT DETECTED Final   Parainfluenza Virus 4 NOT DETECTED NOT DETECTED Final   Respiratory  Syncytial Virus NOT DETECTED NOT DETECTED Final   Bordetella pertussis NOT DETECTED NOT DETECTED Final   Chlamydophila pneumoniae NOT DETECTED NOT DETECTED Final   Mycoplasma pneumoniae NOT DETECTED NOT DETECTED Final    Comment: Performed at [facility], [place] [address] [place], [place] [address]  Culture, blood (routine x 2)     Status: None (Preliminary result)   Collection Time: 09/22/18 11:15 AM  Result Value Ref Range Status   Specimen Description BLOOD DRAWN BY DIALYSIS AVF ARTERIAL END  Final   Special Requests   Final    BOTTLES DRAWN AEROBIC AND ANAEROBIC Blood Culture adequate volume   Culture   Final    NO GROWTH < 24 HOURS Performed at [facility], [address], [place], [place] [address]    Report Status PENDING  Incomplete  Culture, blood (routine x 2)     Status: None (Preliminary result)   Collection Time: 09/22/18 11:33 AM  Result Value Ref Range Status   Specimen Description BLOOD DRAWN BY DIALYSIS AVF VENOUS END  Final   Special Requests   Final    BOTTLES DRAWN AEROBIC AND ANAEROBIC Blood Culture adequate volume   Culture   Final    NO GROWTH < 24 HOURS Performed at [facility], [address], [place], [place] [address]    Report Status PENDING  Incomplete  Fungus culture, blood     Status: None (Preliminary result)   Collection Time: 09/22/18 11:59 AM  Result Value Ref Range Status   Specimen Description BLOOD DRAWN BY DIALYSIS  Final   Special Requests   Final    BOTTLES DRAWN AEROBIC AND ANAEROBIC Blood Culture adequate volume Performed at [facility], [address], [place], [place] [address]    Culture PENDING  Incomplete   Report Status PENDING  Incomplete         Radiology Studies: No results found.      Scheduled Meds: . amLODipine  5 mg Oral Daily  . aspirin  81 mg Oral Daily  . atorvastatin  40 mg Oral QPM  . carvedilol  25 mg Oral BID  . Chlorhexidine Gluconate Cloth  6 each Topical Q0600  . heparin  5,000 Units  Subcutaneous Q8H  . insulin aspart  0-9 Units Subcutaneous TID WC  .  insulin aspart protamine- aspart  20 Units Subcutaneous Q breakfast  . insulin aspart protamine- aspart  6 Units Subcutaneous Q supper  . pantoprazole  40 mg Oral Daily  . sacubitril-valsartan  1 tablet Oral BID  . sevelamer carbonate  2,400 mg Oral TID WC  . sodium bicarbonate  650 mg Oral QID   Continuous Infusions: . sodium chloride    . sodium chloride       LOS: 1 day    Time spent: [place], MD [facility]  If 7PM-7AM, please contact night-coverage [email or web address] Password [email or web address] 09/23/2018, 11:02 AM

## 2018-09-24 LAB — RENAL FUNCTION PANEL
Albumin: 3.1 g/dL — ABNORMAL LOW (ref 3.5–5.0)
Anion gap: 15 (ref 5–15)
BUN: 50 mg/dL — ABNORMAL HIGH (ref 6–20)
CO2: 24 mmol/L (ref 22–32)
Calcium: 8.5 mg/dL — ABNORMAL LOW (ref 8.9–10.3)
Chloride: 91 mmol/L — ABNORMAL LOW (ref 98–111)
Creatinine, Ser: 10.97 mg/dL — ABNORMAL HIGH (ref 0.61–1.24)
GFR calc Af Amer: 5 mL/min — ABNORMAL LOW (ref >60)
GFR calc non Af Amer: 5 mL/min — ABNORMAL LOW (ref >60)
Glucose, Bld: 245 mg/dL — ABNORMAL HIGH (ref 70–99)
Phosphorus: 6.8 mg/dL — ABNORMAL HIGH (ref 2.5–4.6)
Potassium: 4.8 mmol/L (ref 3.5–5.1)
Sodium: 130 mmol/L — ABNORMAL LOW (ref 135–145)

## 2018-09-24 LAB — CBC
HCT: 38.4 % — ABNORMAL LOW (ref 39.0–52.0)
Hemoglobin: 12.2 g/dL — ABNORMAL LOW (ref 13.0–17.0)
MCH: 30.5 pg (ref 26.0–34.0)
MCHC: 31.8 g/dL (ref 30.0–36.0)
MCV: 96 fL (ref 80.0–100.0)
Platelets: 185 10*3/uL (ref 150–400)
RBC: 4 MIL/uL — ABNORMAL LOW (ref 4.22–5.81)
RDW: 15.8 % — ABNORMAL HIGH (ref 11.5–15.5)
WBC: 7.9 K/uL (ref 4.0–10.5)
nRBC: 0 % (ref 0.0–0.2)

## 2018-09-24 LAB — PROCALCITONIN: Procalcitonin: 1.55 ng/mL

## 2018-09-24 LAB — GLUCOSE, CAPILLARY
GLUCOSE-CAPILLARY: 231 mg/dL — AB (ref 70–99)
Glucose-Capillary: 260 mg/dL — ABNORMAL HIGH (ref 70–99)
Glucose-Capillary: 128 mg/dL — ABNORMAL HIGH (ref 70–99)

## 2018-09-24 LAB — MAGNESIUM: Magnesium: 1.9 mg/dL (ref 1.7–2.4)

## 2018-09-24 MED ORDER — SODIUM CHLORIDE 0.9 % IV SOLN: 100.0000 mL | INTRAVENOUS | Status: DC | PRN

## 2018-09-24 NOTE — Progress Notes (Addendum)
Removed IV-clean, dry, intact. Patient d/cd at in AM and needed to have dialysis. Dialysis finished around 1600. I reviewed d/c paperwork with patient and answered all questions. Patient stated that he needed to  Take a shower and wait for his ride. He refused 1700 and 1800 meds because he was leaving. Stable patient was wheeled to main entrance where he was picked up by his friend.

## 2018-09-24 NOTE — Discharge Instructions (Signed)

## 2018-09-24 NOTE — Discharge Summary (Signed)
Physician Discharge Summary  [name] [name] IHK:[id number] DOB: 04/11/1961 DOA: 09/20/2018  PCP: Patient, No Pcp Per  Admit date: 09/20/2018 Discharge date: 09/24/2018  Admitted From: Home Disposition: Home  Recommendations for Outpatient Follow-up:  1. Follow up with PCP in 1-2 weeks 2. Please obtain RFP/CBC in one week   Home Health: No Equipment/Devices:No  Discharge Condition: stable CODE STATUS:FULL Diet recommendation: Renal/carb modified diet  Brief/Interim Summary:  #) Fever without a source: Patient was admitted from dialysis with nausea and vomiting and fever without clear source.  He was initially hypertensive.  Respiratory virus panel was negative.  Blood cultures on 09/20/2018 and 09/22/2018 were negative.  Urine culture from 09/20/2018 was negative.  Procalcitonin was mildly elevated but no clear source could be found.  CT abdomen pelvis with contrast was negative for any intra-abdominal source.  Empiric antibiotics were held and patient's fever self resolved.  #) ESRD on Monday/Wednesday/Friday dialysis: Nephrology was consulted.  Patient was continued on home sevelamer and sodium bicarbonate tablets.  #) Type 2 diabetes: Patient was continued on home 70/30 insulin regimen.  He was given a diabetic diet and maintained on sliding scale here.  #) Hypertension/hyperlipidemia: Patient was continued on home carvedilol, amlodipine, atorvastatin, aspirin.  #) Chronic nonischemic CHF: Patient was noted to have an echo in [place] at the beginning of 2019 that showed a diminished EF.  A cardiac catheterization showed no significant coronary artery disease.  He was continued on his home Entresto and beta-blocker.  Discharge Diagnoses:  Principal Problem:   Fever Active Problems:   ESRD (end stage renal disease) (HCC)   Nausea & vomiting   DM2 (diabetes mellitus, type 2) (HCC)   HTN (hypertension)    Discharge Instructions  Discharge Instructions    Call MD for:   difficulty breathing, headache or visual disturbances   Complete by:  As directed    Call MD for:  hives   Complete by:  As directed    Call MD for:  persistant dizziness or light-headedness   Complete by:  As directed    Call MD for:  persistant nausea and vomiting   Complete by:  As directed    Call MD for:  redness, tenderness, or signs of infection (pain, swelling, redness, odor or green/yellow discharge around incision site)   Complete by:  As directed    Call MD for:  severe uncontrolled pain   Complete by:  As directed    Call MD for:  temperature >100.4   Complete by:  As directed    Diet - low sodium heart healthy   Complete by:  As directed    Discharge instructions   Complete by:  As directed    Please follow-up with your primary care doctor in 1 week.   Increase activity slowly   Complete by:  As directed      Allergies as of 09/24/2018   No Known Allergies     Medication List    TAKE these medications   amLODipine 5 MG tablet Commonly known as:  NORVASC Take 5 mg by mouth daily.   aspirin 81 MG chewable tablet Chew 81 mg by mouth daily.   atorvastatin 40 MG tablet Commonly known as:  LIPITOR Take 40 mg by mouth every evening.   carvedilol 25 MG tablet Commonly known as:  COREG Take 25 mg by mouth 2 (two) times daily.   ENTRESTO 24-26 MG Generic drug:  sacubitril-valsartan Take 1 tablet by mouth 2 (two) times daily.  HUMULIN 70/30 (70-30) 100 UNIT/ML injection Generic drug:  insulin NPH-regular Human Inject 30 Units into the skin every other day. At bedtime:: Prescribed 20 units in the morning and 6 units in the evening   lidocaine-prilocaine cream Commonly known as:  EMLA Apply 1 application topically every Monday, Wednesday, and Friday. At dialysis   pantoprazole 20 MG tablet Commonly known as:  PROTONIX Take 20 mg by mouth daily.   RENVELA 2.4 g Pack Generic drug:  sevelamer carbonate VIGOROUSLY MIX CONTENTS OF 1 PACKET IN WATER (IT WILL  NOT DISSOLVE) AND DRINK 3 TIMES DAILY WITH MEALS   sildenafil 100 MG tablet Commonly known as:  VIAGRA Take 100 mg by mouth daily as needed for erectile dysfunction.   sodium bicarbonate 650 MG tablet Take 1 tablet by mouth 4 (four) times daily.       No Known Allergies  Consultations:  Nephrology   Procedures/Studies: Dg Chest 2 View  Result Date: 09/20/2018 CLINICAL DATA:  Nausea and vomiting for 3 days. Lightheaded and dizzy. Decreased blood pressure today after dialysis. History of diabetes, hypertension, kidney failure. EXAM: CHEST - 2 VIEW COMPARISON:  None. FINDINGS: Heart size and pulmonary vascularity are normal for technique. Lungs are clear and expanded. No blunting of costophrenic angles. No pneumothorax. Mediastinal contours appear intact. Degenerative changes in the spine. IMPRESSION: No active cardiopulmonary disease. Electronically Signed   By: Lucienne Capers M.D.   On: 09/20/2018 22:03   Ct Abdomen Pelvis W Contrast  Result Date: 09/23/2018 CLINICAL DATA:  Nausea, vomiting and fever.  Dialysis patient. EXAM: CT ABDOMEN AND PELVIS WITH CONTRAST TECHNIQUE: Multidetector CT imaging of the abdomen and pelvis was performed using the standard protocol following bolus administration of intravenous contrast. CONTRAST:  125 ml ISOVUE-300 IOPAMIDOL (ISOVUE-300) INJECTION 61%, 4mL ISOVUE-300 IOPAMIDOL (ISOVUE-300) INJECTION 61% COMPARISON:  None. FINDINGS: Lower chest: No acute abnormality. Hepatobiliary: No focal liver abnormality is seen. No gallstones, gallbladder wall thickening, or biliary dilatation. Pancreas: Unremarkable. No pancreatic ductal dilatation or surrounding inflammatory changes. Spleen: Normal in size without focal abnormality. Adrenals/Urinary Tract: No adrenal masses. Bilateral renal cortical thinning with a mild reduction in overall renal sizes. There are renal calcifications that appear vascular. No renal masses. No hydronephrosis. Ureters are normal in  course and in caliber. No ureteral stones. Bladder is decompressed and not well evaluated. No gross abnormality. Stomach/Bowel: Stomach is within normal limits. Appendix appears normal. No evidence of bowel wall thickening, distention, or inflammatory changes. Vascular/Lymphatic: Mild aortic atherosclerosis.  No adenopathy. Reproductive: Unremarkable. Other: No abdominal wall hernia or abnormality. No abdominopelvic ascites. Musculoskeletal: No acute fracture. No osteoblastic or osteolytic lesions. 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 IMPRESSION: 1. No acute findings within the abdomen or pelvis. 2. Bilateral renal cortical thinning consistent with the history of renal failure. 3. Mild aortic atherosclerosis. Electronically Signed   By: Lajean Manes M.D.   On: 09/23/2018 19:38     Subjective:   Discharge Exam: Vitals:   09/23/18 2150 09/24/18 0705  BP: (!) 141/80 127/68  Pulse: 77 72  Resp: 20   Temp: 97.7 F (36.5 C) 98.4 F (36.9 C)  SpO2: 98% 96%   Vitals:   09/23/18 0505 09/23/18 1458 09/23/18 2150 09/24/18 0705  BP: 125/76 125/66 (!) 141/80 127/68  Pulse: 76 75 77 72  Resp: 18 18 20    Temp: 98.7 F (37.1 C)  97.7 F (36.5 C) 98.4 F (36.9 C)  TempSrc: Oral  Oral Oral  SpO2: 96%  97% 98% 96%  Weight:      Height:       General exam: Appears calm and comfortable  Respiratory system: Clear to auscultation. Respiratory effort normal. Cardiovascular system: Regular rate and rhythm, 2 out of 6 systolic referred murmur Gastrointestinal system: Abdomen is nondistended, soft and nontender. No organomegaly or masses felt. Normal bowel sounds heard. Central nervous system: Alert and oriented.  Is intact, moving all extremities Extremities: Trace lower extremity edema, left upper extremity fistula site with palpable thrill and audible bruit Skin: No rashes over visible skin Psychiatry: Judgement and insight appear normal. Mood & affect  appropriate   The results of significant diagnostics from this hospitalization (including imaging, microbiology, ancillary and laboratory) are listed below for reference.     Microbiology: Recent Results (from the past 240 hour(s))  Culture, Urine     Status: None   Collection Time: 09/20/18  9:18 PM  Result Value Ref Range Status   Specimen Description   Final    URINE, RANDOM Performed at [facility], [address], [place], [place] [address]    Special Requests   Final    NONE Performed at [facility], [address], [place], [place] [address]    Culture   Final    NO GROWTH Performed at [facility], [place] [address], [place], [place] [address]    Report Status 09/22/2018 FINAL  Final  Blood culture (routine x 2)     Status: None (Preliminary result)   Collection Time: 09/20/18 10:56 PM  Result Value Ref Range Status   Specimen Description BLOOD RIGHT HAND  Final   Special Requests   Final    BOTTLES DRAWN AEROBIC AND ANAEROBIC Blood Culture adequate volume   Culture   Final    NO GROWTH 4 DAYS Performed at [facility], [address], [place], [place] [address]    Report Status PENDING  Incomplete  Blood culture (routine x 2)     Status: None (Preliminary result)   Collection Time: 09/20/18 11:07 PM  Result Value Ref Range Status   Specimen Description RIGHT ANTECUBITAL  Final   Special Requests   Final    BOTTLES DRAWN AEROBIC AND ANAEROBIC Blood Culture adequate volume   Culture   Final    NO GROWTH 4 DAYS Performed at [facility], [address], [place], [place] [address]    Report Status PENDING  Incomplete  Respiratory Panel by PCR     Status: None   Collection Time: 09/21/18  8:10 PM  Result Value Ref Range Status   Adenovirus NOT DETECTED NOT DETECTED Final   Coronavirus 229E NOT DETECTED NOT DETECTED Final   Coronavirus HKU1 NOT DETECTED NOT DETECTED Final   Coronavirus NL63 NOT DETECTED NOT DETECTED Final   Coronavirus OC43 NOT DETECTED NOT  DETECTED Final   Metapneumovirus NOT DETECTED NOT DETECTED Final   Rhinovirus / Enterovirus NOT DETECTED NOT DETECTED Final   Influenza A NOT DETECTED NOT DETECTED Final   Influenza B NOT DETECTED NOT DETECTED Final   Parainfluenza Virus 1 NOT DETECTED NOT DETECTED Final   Parainfluenza Virus 2 NOT DETECTED NOT DETECTED Final   Parainfluenza Virus 3 NOT DETECTED NOT DETECTED Final   Parainfluenza Virus 4 NOT DETECTED NOT DETECTED Final   Respiratory Syncytial Virus NOT DETECTED NOT DETECTED Final   Bordetella pertussis NOT DETECTED NOT DETECTED Final   Chlamydophila pneumoniae NOT DETECTED NOT DETECTED Final   Mycoplasma pneumoniae NOT DETECTED NOT DETECTED Final  Comment: Performed at [facility], [place] [address] [place], [place] [address]  Culture, blood (routine x 2)     Status: None (Preliminary result)   Collection Time: 09/22/18 11:15 AM  Result Value Ref Range Status   Specimen Description BLOOD DRAWN BY DIALYSIS AVF ARTERIAL END  Final   Special Requests   Final    BOTTLES DRAWN AEROBIC AND ANAEROBIC Blood Culture adequate volume   Culture   Final    NO GROWTH 2 DAYS Performed at [facility], [address], [place], [place] [address]    Report Status PENDING  Incomplete  Culture, blood (routine x 2)     Status: None (Preliminary result)   Collection Time: 09/22/18 11:33 AM  Result Value Ref Range Status   Specimen Description BLOOD DRAWN BY DIALYSIS AVF VENOUS END  Final   Special Requests   Final    BOTTLES DRAWN AEROBIC AND ANAEROBIC Blood Culture adequate volume   Culture   Final    NO GROWTH 2 DAYS Performed at [facility], [address], [place], [place] [address]    Report Status PENDING  Incomplete  Fungus culture, blood     Status: None (Preliminary result)   Collection Time: 09/22/18 11:59 AM  Result Value Ref Range Status   Specimen Description BLOOD DRAWN BY DIALYSIS  Final   Special Requests   Final    BOTTLES DRAWN AEROBIC AND ANAEROBIC Blood  Culture adequate volume Performed at [facility], [address], [place], [place] [address]    Culture PENDING  Incomplete   Report Status PENDING  Incomplete     Labs: BNP (last 3 results) No results for input(s): BNP in the last 8760 hours. Basic Metabolic Panel: Recent Labs  Lab 09/20/18 1902 09/21/18 0428 09/22/18 0453 09/23/18 0458 09/24/18 0512  NA 131* 133* 133* 134* 130*  K 5.1 4.6 4.4 4.6 4.8  CL 89* 92* 91* 93* 91*  CO2 30 26 28 26 24   GLUCOSE 293* 299* 276* 201* 245*  BUN 19 27* 41* 30* 50*  CREATININE 7.19* 8.33* 11.28* 9.11* 10.97*  CALCIUM 8.9 8.6* 8.7* 8.7* 8.5*  MG  --   --   --   --  1.9  PHOS  --   --   --   --  6.8*   Liver Function Tests: Recent Labs  Lab 09/20/18 1902 09/21/18 0428 09/24/18 0512  AST 11* 11*  --   ALT 15 11  --   ALKPHOS 99 79  --   BILITOT 0.8 0.9  --   PROT 9.0* 7.5  --   ALBUMIN 4.2 3.4* 3.1*   Recent Labs  Lab 09/20/18 1902  LIPASE 56*   No results for input(s): AMMONIA in the last 168 hours. CBC: Recent Labs  Lab 09/20/18 1902 09/21/18 0428 09/22/18 0453 09/23/18 0458 09/24/18 0512  WBC 9.9 9.5 10.8* 8.8 7.9  NEUTROABS 7.0  --   --   --   --   HGB 14.1 12.6* 12.8* 12.8* 12.2*  HCT 44.8 39.6 41.0 39.8 38.4*  MCV 95.5 97.5 97.4 96.4 96.0  PLT 185 167 165 174 185   Cardiac Enzymes: No results for input(s): CKTOTAL, CKMB, CKMBINDEX, TROPONINI in the last 168 hours. BNP: Invalid input(s): POCBNP CBG: Recent Labs  Lab 09/23/18 0740 09/23/18 1109 09/23/18 1657 09/23/18 2143 09/24/18 0736  GLUCAP 214* 221* 180* 138* 231*   D-Dimer No results for input(s): DDIMER in the last 72 hours. Hgb A1c No  results for input(s): HGBA1C in the last 72 hours. Lipid Profile No results for input(s): CHOL, HDL, LDLCALC, TRIG, CHOLHDL, LDLDIRECT in the last 72 hours. Thyroid function studies No results for input(s): TSH, T4TOTAL, T3FREE, THYROIDAB in the last 72 hours.  Invalid input(s): FREET3 Anemia work up No  results for input(s): VITAMINB12, FOLATE, FERRITIN, TIBC, IRON, RETICCTPCT in the last 72 hours. Urinalysis    Component Value Date/Time   COLORURINE YELLOW 09/20/2018 2118   APPEARANCEUR CLEAR 09/20/2018 2118   LABSPEC 1.012 09/20/2018 2118   PHURINE 8.0 09/20/2018 2118   GLUCOSEU >=500 (A) 09/20/2018 2118   HGBUR NEGATIVE 09/20/2018 2118   BILIRUBINUR NEGATIVE 09/20/2018 2118   KETONESUR NEGATIVE 09/20/2018 2118   PROTEINUR >=300 (A) 09/20/2018 2118   NITRITE NEGATIVE 09/20/2018 2118   LEUKOCYTESUR SMALL (A) 09/20/2018 2118   Sepsis Labs Invalid input(s): PROCALCITONIN,  WBC,  LACTICIDVEN Microbiology Recent Results (from the past 240 hour(s))  Culture, Urine     Status: None   Collection Time: 09/20/18  9:18 PM  Result Value Ref Range Status   Specimen Description   Final    URINE, RANDOM Performed at [facility], [address] [place], [place] [address]    Special Requests   Final    NONE Performed at [facility], [address] [place], [place] [address]    Culture   Final    NO GROWTH Performed at [facility], [place] [address] [place], [place] [address]    Report Status 09/22/2018 FINAL  Final  Blood culture (routine x 2)     Status: None (Preliminary result)   Collection Time: 09/20/18 10:56 PM  Result Value Ref Range Status   Specimen Description BLOOD RIGHT HAND  Final   Special Requests   Final    BOTTLES DRAWN AEROBIC AND ANAEROBIC Blood Culture adequate volume   Culture   Final    NO GROWTH 4 DAYS Performed at [facility], [address] [place], [place] [address]    Report Status PENDING  Incomplete  Blood culture (routine x 2)     Status: None (Preliminary result)   Collection Time: 09/20/18 11:07 PM  Result Value Ref Range Status   Specimen Description RIGHT ANTECUBITAL  Final   Special Requests   Final    BOTTLES DRAWN AEROBIC AND ANAEROBIC Blood Culture adequate volume   Culture   Final    NO GROWTH 4 DAYS Performed at [facility], [address] [place], [place] [address]    Report Status PENDING  Incomplete  Respiratory Panel by PCR     Status: None   Collection Time: 09/21/18  8:10 PM  Result Value Ref Range Status   Adenovirus NOT DETECTED NOT DETECTED Final   Coronavirus 229E NOT DETECTED NOT DETECTED Final   Coronavirus HKU1 NOT DETECTED NOT DETECTED Final   Coronavirus NL63 NOT DETECTED NOT DETECTED Final   Coronavirus OC43 NOT DETECTED NOT DETECTED Final   Metapneumovirus NOT DETECTED NOT DETECTED Final   Rhinovirus / Enterovirus NOT DETECTED NOT DETECTED Final   Influenza A NOT DETECTED NOT DETECTED Final   Influenza B NOT DETECTED NOT DETECTED Final   Parainfluenza Virus 1 NOT DETECTED NOT DETECTED Final   Parainfluenza Virus 2 NOT DETECTED NOT DETECTED Final   Parainfluenza Virus 3 NOT DETECTED NOT DETECTED Final   Parainfluenza Virus 4 NOT DETECTED NOT DETECTED Final   Respiratory Syncytial Virus NOT DETECTED NOT DETECTED Final   Bordetella pertussis NOT DETECTED NOT DETECTED  Final   Chlamydophila pneumoniae NOT DETECTED NOT DETECTED Final   Mycoplasma pneumoniae NOT DETECTED NOT DETECTED Final    Comment: Performed at [facility], [place] [address] [place], [place] [address]  Culture, blood (routine x 2)     Status: None (Preliminary result)   Collection Time: 09/22/18 11:15 AM  Result Value Ref Range Status   Specimen Description BLOOD DRAWN BY DIALYSIS AVF ARTERIAL END  Final   Special Requests   Final    BOTTLES DRAWN AEROBIC AND ANAEROBIC Blood Culture adequate volume   Culture   Final    NO GROWTH 2 DAYS Performed at [facility], [address], [place], [place] [address]    Report Status PENDING  Incomplete  Culture, blood (routine x 2)     Status: None (Preliminary result)   Collection Time: 09/22/18 11:33 AM  Result Value Ref Range Status   Specimen Description BLOOD DRAWN BY DIALYSIS AVF VENOUS END  Final   Special Requests   Final    BOTTLES DRAWN AEROBIC AND ANAEROBIC  Blood Culture adequate volume   Culture   Final    NO GROWTH 2 DAYS Performed at [facility], [address], [place], [place] [address]    Report Status PENDING  Incomplete  Fungus culture, blood     Status: None (Preliminary result)   Collection Time: 09/22/18 11:59 AM  Result Value Ref Range Status   Specimen Description BLOOD DRAWN BY DIALYSIS  Final   Special Requests   Final    BOTTLES DRAWN AEROBIC AND ANAEROBIC Blood Culture adequate volume Performed at [facility], [address], [place], [place] [address]    Culture PENDING  Incomplete   Report Status PENDING  Incomplete     Time coordinating discharge: 69  SIGNED:   Cristy Folks, MD  [facility] 09/24/2018, 10:10 AM  If 7PM-7AM, please contact night-coverage [email or web address] Password [email or web address]

## 2018-09-24 NOTE — Procedures (Signed)
      HEMODIALYSIS TREATMENT NOTE:   4 hour heparin-free dialysis completed via left upper arm AVF (15g/antegrade). Goal met: 2.5 liters removed without interruption in ultrafiltration.  All blood was returned and hemostasis was achieved in 15 minutes.     Rockwell Alexandria, RN

## 2018-09-24 NOTE — Progress Notes (Signed)
Subjective: Interval History: Patient offers no complaints.  He does not have any fever chills or sweating.  Cough is improving.  He denies any difficulty breathing..  Objective: Vital signs in last 24 hours: Temp:  [97.7 F (36.5 C)-98.4 F (36.9 C)] 98.4 F (36.9 C) (11/08 0705) Pulse Rate:  [72-77] 72 (11/08 0705) Resp:  [18-20] 20 (11/07 2150) BP: (125-141)/(66-80) 127/68 (11/08 0705) SpO2:  [96 %-98 %] 96 % (11/08 0705) Weight change:   Intake/Output from previous day: 11/07 0701 - 11/08 0700 In: 480 [P.O.:480] Out: -  Intake/Output this shift: No intake/output data recorded.  Generally patient is alert and in no apparent distress. Chest is clear to auscultation, no rales rhonchi or egophony Heart exam revealed regular rate and rhythm no murmur Extremities no edema  Lab Results: Recent Labs    09/23/18 0458 09/24/18 0512  WBC 8.8 7.9  HGB 12.8* 12.2*  HCT 39.8 38.4*  PLT 174 185   BMET:  Recent Labs    09/23/18 0458 09/24/18 0512  NA 134* 130*  K 4.6 4.8  CL 93* 91*  CO2 26 24  GLUCOSE 201* 245*  BUN 30* 50*  CREATININE 9.11* 10.97*  CALCIUM 8.7* 8.5*   No results for input(s): PTH in the last 72 hours. Iron Studies: No results for input(s): IRON, TIBC, TRANSFERRIN, FERRITIN in the last 72 hours.  Studies/Results: Ct Abdomen Pelvis W Contrast  Result Date: 09/23/2018 CLINICAL DATA:  Nausea, vomiting and fever.  Dialysis patient. EXAM: CT ABDOMEN AND PELVIS WITH CONTRAST TECHNIQUE: Multidetector CT imaging of the abdomen and pelvis was performed using the standard protocol following bolus administration of intravenous contrast. CONTRAST:  125 ml ISOVUE-300 IOPAMIDOL (ISOVUE-300) INJECTION 61%, 36mL ISOVUE-300 IOPAMIDOL (ISOVUE-300) INJECTION 61% COMPARISON:  None. FINDINGS: Lower chest: No acute abnormality. Hepatobiliary: No focal liver abnormality is seen. No gallstones, gallbladder wall thickening, or biliary dilatation. Pancreas: Unremarkable. No  pancreatic ductal dilatation or surrounding inflammatory changes. Spleen: Normal in size without focal abnormality. Adrenals/Urinary Tract: No adrenal masses. Bilateral renal cortical thinning with a mild reduction in overall renal sizes. There are renal calcifications that appear vascular. No renal masses. No hydronephrosis. Ureters are normal in course and in caliber. No ureteral stones. Bladder is decompressed and not well evaluated. No gross abnormality. Stomach/Bowel: Stomach is within normal limits. Appendix appears normal. No evidence of bowel wall thickening, distention, or inflammatory changes. Vascular/Lymphatic: Mild aortic atherosclerosis.  No adenopathy. Reproductive: Unremarkable. Other: No abdominal wall hernia or abnormality. No abdominopelvic ascites. Musculoskeletal: No acute fracture. No osteoblastic or osteolytic lesions. 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 IMPRESSION: 1. No acute findings within the abdomen or pelvis. 2. Bilateral renal cortical thinning consistent with the history of renal failure. 3. Mild aortic atherosclerosis. Electronically Signed   By: Lajean Manes M.D.   On: 09/23/2018 19:38    I have reviewed the patient's current medications.  Assessment/Plan: 1] fever: Etiology not clear.  Patient is a febrile today.  His white blood cell count is normal. blood culture has no growth 2] end-stage renal disease: He is status post hemodialysis on Wednesday.  Patient presently asymptomatic. 3] anemia: His hemoglobin is 12.2 and remained above our target goal.  Epogen is on hold. 4] hypertension: His blood pressure is reasonably controlled 5] history of diabetes: He denies any polydipsia.  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  Patient is on a binder.Marland Kitchen  7] fluid management.  Patient does not have any sign and symptom of of fluid overlod 8] metabolic acidosis:  Patient is on sodium bicarbonate and his CO2 is 24. Plan: 1] we will dialyze him for 4 hours today and try to remove about 2-1/2 L. 2] his next dialysis would be on Monday which is his regular schedule and can be done as an outpatient. 3] we will check his renal panel in the morning. 4] patient advised to cut down his phosphorus intake.  LOS: 2 days   Maille Halliwell S 09/24/2018,8:19 AM

## 2018-09-24 NOTE — Progress Notes (Signed)
Inpatient Diabetes Program Recommendations  AACE/ADA: New Consensus Statement on Inpatient Glycemic Control (2015)  Target Ranges:  Prepandial:   less than 140 mg/dL      Peak postprandial:   less than 180 mg/dL (1-2 hours)      Critically ill patients:  140 - 180 mg/dL   Results for [name] [name], [name] [name] (MRN [id number]) as of 09/24/2018 09:40  Ref. Range 09/23/2018 07:40 09/23/2018 11:09 09/23/2018 16:57 09/23/2018 21:43  Glucose-Capillary Latest Ref Range: 70 - 99 mg/dL 214 (H)  3 units NOVOLOG +  20 units 70/30 Insulin   221 (H)  3 units NOVOLOG  180 (H)  2 units NOVOLOG +  6 units 70/30 Insulin    138 (H)   Results for [name] [name], [name] [name] (MRN [id number]) as of 09/24/2018 09:40  Ref. Range 09/24/2018 07:36  Glucose-Capillary Latest Ref Range: 70 - 99 mg/dL 231 (H)    Home DM Meds: Humulin 70/30 Insulin- 20 units AM/ 6 units PM  Current Orders: 70/30 Insulin- 20 units AM/ 6 units PM      Novolog Sensitive Correction Scale/ SSI (0-9 units) TID AC + HS     MD- Please consider increasing 70/30 Insulin to:   25 units QAM and 8 units QPM    --Will follow patient during hospitalization--  Wyn Quaker RN, MSN, CDE Diabetes Coordinator Inpatient Glycemic Control Team Team Pager: [phone] (8a-5p)

## 2018-09-25 LAB — QUANTIFERON-TB GOLD PLUS (RQFGPL)
QuantiFERON Mitogen Value: >10 IU/mL
QuantiFERON Nil Value: 0.08 IU/mL
QuantiFERON TB1 Ag Value: 0.08 IU/mL
QuantiFERON TB2 Ag Value: 0.12 IU/mL

## 2018-09-25 LAB — CULTURE, BLOOD (ROUTINE X 2)
CULTURE: NO GROWTH
Culture: NO GROWTH
Special Requests: ADEQUATE
Special Requests: ADEQUATE

## 2018-09-25 LAB — QUANTIFERON-TB GOLD PLUS: QuantiFERON-TB Gold Plus: NEGATIVE

## 2018-09-27 LAB — CULTURE, BLOOD (ROUTINE X 2)
Culture: NO GROWTH
Culture: NO GROWTH
Special Requests: ADEQUATE
Special Requests: ADEQUATE

## 2018-09-29 LAB — FUNGUS CULTURE, BLOOD
Culture: NO GROWTH
SPECIAL REQUESTS: ADEQUATE

## 2019-08-05 ENCOUNTER — Emergency Department (HOSPITAL_COMMUNITY): Payer: Medicare Other

## 2019-08-05 ENCOUNTER — Other Ambulatory Visit: Payer: Self-pay

## 2019-08-05 ENCOUNTER — Encounter (HOSPITAL_COMMUNITY): Payer: Self-pay | Admitting: Emergency Medicine

## 2019-08-05 ENCOUNTER — Emergency Department (HOSPITAL_COMMUNITY)
Admission: EM | Admit: 2019-08-05 | Discharge: 2019-08-06 | Disposition: A | Discharge status: Home / Self Care | Payer: Medicare Other | Attending: Emergency Medicine | Admitting: Emergency Medicine

## 2019-08-05 DIAGNOSIS — R1012 Left upper quadrant pain: Secondary | ICD-10-CM | POA: Diagnosis present

## 2019-08-05 DIAGNOSIS — R55 Syncope and collapse: Secondary | ICD-10-CM | POA: Insufficient documentation

## 2019-08-05 DIAGNOSIS — Z79899 Other long term (current) drug therapy: Secondary | ICD-10-CM | POA: Diagnosis not present

## 2019-08-05 DIAGNOSIS — Z992 Dependence on renal dialysis: Secondary | ICD-10-CM | POA: Insufficient documentation

## 2019-08-05 DIAGNOSIS — E119 Type 2 diabetes mellitus without complications: Secondary | ICD-10-CM | POA: Diagnosis not present

## 2019-08-05 DIAGNOSIS — Z794 Long term (current) use of insulin: Secondary | ICD-10-CM | POA: Insufficient documentation

## 2019-08-05 DIAGNOSIS — I120 Hypertensive chronic kidney disease with stage 5 chronic kidney disease or end stage renal disease: Secondary | ICD-10-CM | POA: Insufficient documentation

## 2019-08-05 DIAGNOSIS — N186 End stage renal disease: Secondary | ICD-10-CM | POA: Insufficient documentation

## 2019-08-05 DIAGNOSIS — Z7982 Long term (current) use of aspirin: Secondary | ICD-10-CM | POA: Insufficient documentation

## 2019-08-05 DIAGNOSIS — R109 Unspecified abdominal pain: Secondary | ICD-10-CM

## 2019-08-05 HISTORY — DX: End stage renal disease: Z99.2

## 2019-08-05 HISTORY — DX: End stage renal disease: N18.6

## 2019-08-05 HISTORY — DX: Other cardiomyopathies: I42.8

## 2019-08-05 LAB — BASIC METABOLIC PANEL
Anion gap: 15 (ref 5–15)
BUN: 24 mg/dL — ABNORMAL HIGH (ref 6–20)
CO2: 27 mmol/L (ref 22–32)
Calcium: 9.5 mg/dL (ref 8.9–10.3)
Chloride: 91 mmol/L — ABNORMAL LOW (ref 98–111)
Creatinine, Ser: 7.8 mg/dL — ABNORMAL HIGH (ref 0.61–1.24)
GFR calc Af Amer: 8 mL/min — ABNORMAL LOW (ref >60)
GFR calc non Af Amer: 7 mL/min — ABNORMAL LOW (ref >60)
Glucose, Bld: 211 mg/dL — ABNORMAL HIGH (ref 70–99)
Potassium: 4.3 mmol/L (ref 3.5–5.1)
Sodium: 133 mmol/L — ABNORMAL LOW (ref 135–145)

## 2019-08-05 LAB — CBC WITH DIFFERENTIAL/PLATELET
Abs Immature Granulocytes: 0.04 10*3/uL (ref 0.00–0.07)
Basophils Absolute: 0.1 10*3/uL (ref 0.0–0.1)
Basophils Relative: 1 %
Eosinophils Absolute: 0.3 10*3/uL (ref 0.0–0.5)
Eosinophils Relative: 3 %
HCT: 53.8 % — ABNORMAL HIGH (ref 39.0–52.0)
Hemoglobin: 16.9 g/dL (ref 13.0–17.0)
Immature Granulocytes: 1 %
Lymphocytes Relative: 18 %
Lymphs Abs: 1.6 10*3/uL (ref 0.7–4.0)
MCH: 31.6 pg (ref 26.0–34.0)
MCHC: 31.4 g/dL (ref 30.0–36.0)
MCV: 100.7 fL — ABNORMAL HIGH (ref 80.0–100.0)
Monocytes Absolute: 1.2 10*3/uL — ABNORMAL HIGH (ref 0.1–1.0)
Monocytes Relative: 14 %
Neutro Abs: 5.6 10*3/uL (ref 1.7–7.7)
Neutrophils Relative %: 63 %
Platelets: 195 10*3/uL (ref 150–400)
RBC: 5.34 MIL/uL (ref 4.22–5.81)
RDW: 16.4 % — ABNORMAL HIGH (ref 11.5–15.5)
WBC: 8.8 10*3/uL (ref 4.0–10.5)
nRBC: 0 % (ref 0.0–0.2)

## 2019-08-05 LAB — TROPONIN I (HIGH SENSITIVITY)
Troponin I (High Sensitivity): 50 ng/L — ABNORMAL HIGH (ref <18)
Troponin I (High Sensitivity): 47 ng/L — ABNORMAL HIGH (ref <18)

## 2019-08-05 LAB — CBG MONITORING, ED: Glucose-Capillary: 143 mg/dL — ABNORMAL HIGH (ref 70–99)

## 2019-08-05 MED ORDER — SODIUM CHLORIDE 0.9% FLUSH: 3.0000 mL | Freq: Once | INTRAVENOUS | Status: DC

## 2019-08-05 NOTE — ED Triage Notes (Addendum)
Patient brought in by EMS, states he was at dialysis receiving treatment and started with nausea and cramping. States he had 3 minutes left of treatment when he complained of dizziness and generalized weakness. Patient still has fistula accessed with dialysis needle in place. Complaining of cramping to abdomen and bilateral legs at triage.

## 2019-08-05 NOTE — ED Notes (Addendum)
Pt's dialysis needles (2) left upper arm fistula dc'd at this time.  Pressure held to both needle sites x 20 minutes.  Site with light pressure dressing placed.  Pt states he will keep an eye on site and let us know if any oozing/bleeding.

## 2019-08-05 NOTE — ED Provider Notes (Signed)
[facility] EMERGENCY DEPARTMENT Provider Note   CSN: [id number] Arrival date & time: 08/05/19  1716     History   Chief Complaint Chief Complaint  Patient presents with   Weakness    HPI [name] BENCE Sr. is a 58 y.o. male.     The history is provided by the patient and the EMS personnel.  Weakness   Pt was seen at 2040. Per pt, c/o gradual onset and persistence of constant abd "pain" that began while in HD approximately 1500/1600 today PTA. Pt states near the end of his treatment he developed abd "pain and cramping," then "passed out." Pt states when he "came to" he had several episodes of N/V. States he also had generalized weakness and generalized muscle "cramping." Pt was given IVF NS while at HD. Pt states he continues to have abd "cramping." Pt has a recent hx of several syncopal episodes, without definitive dx. Denies CP/palpitations, no SOB/cough, no fevers, no diarrhea, no back pain, no focal motor weakness, no black or blood in stools or emesis, no reported seizure activity.      Past Medical History:  Diagnosis Date   Diabetes mellitus without complication ([place])    ESRD on hemodialysis ([place])    Hypertension    Kidney failure    Nonischemic cardiomyopathy [facility])     Patient Active Problem List   Diagnosis Date Noted   ESRD (end stage renal disease) ([place]) 09/20/2018   Fever 09/20/2018   Nausea & vomiting 09/20/2018   DM2 (diabetes mellitus, type 2) ([place]) 09/20/2018   HTN (hypertension) 09/20/2018    Past Surgical History:  Procedure Laterality Date   AV FISTULA PLACEMENT     IRRIGATION AND DEBRIDEMENT ABSCESS     TOE AMPUTATION          Home Medications    Prior to Admission medications   Medication Sig Start Date End Date Taking? Authorizing Provider  amLODipine (NORVASC) 5 MG tablet Take 5 mg by mouth daily. 07/26/18   [provider]  aspirin 81 MG chewable tablet Chew 81 mg by mouth daily. 09/01/18   [provider]  atorvastatin (LIPITOR) 40 MG tablet Take 40 mg by mouth every evening.  09/01/18   [provider]  carvedilol (COREG) 25 MG tablet Take 25 mg by mouth 2 (two) times daily. 08/31/18   [provider]  ENTRESTO 24-26 MG Take 1 tablet by mouth 2 (two) times daily. 07/29/18   [provider]  HUMULIN 70/30 (70-30) 100 UNIT/ML injection Inject 30 Units into the skin every other day. At bedtime:: Prescribed 20 units in the morning and 6 units in the evening 08/31/18   [provider]  lidocaine-prilocaine (EMLA) cream Apply 1 application topically every Monday, Wednesday, and Friday. At dialysis 08/17/18   [provider]  pantoprazole (PROTONIX) 20 MG tablet Take 20 mg by mouth daily.     [provider]  RENVELA 2.4 g PACK VIGOROUSLY [place] 1 PACKET IN WATER (IT WILL NOT DISSOLVE) AND DRINK 3 TIMES DAILY WITH MEALS 07/16/18   [provider]  sildenafil (VIAGRA) 100 MG tablet Take 100 mg by mouth daily as needed for erectile dysfunction.  09/02/18   [provider]  sodium bicarbonate 650 MG tablet Take 1 tablet by mouth 4 (four) times daily. 08/31/18   [provider]    Family History History reviewed. No pertinent family history.  Social History Social History   Tobacco Use  Smoking status: Never Smoker   Smokeless tobacco: Never Used  Substance Use Topics   Alcohol use: Not Currently   Drug use: Never     Allergies   Patient has no known allergies.   Review of Systems Review of Systems  Neurological: Positive for weakness.  ROS: Statement: All systems negative except as marked or noted in the HPI; Constitutional: Negative for fever and chills. ; ; Eyes: Negative for eye pain, redness and discharge. ; ; ENMT: Negative for ear pain, hoarseness, nasal congestion, sinus pressure and sore throat. ; ; Cardiovascular: Negative for chest pain, palpitations, diaphoresis, dyspnea and peripheral  edema. ; ; Respiratory: Negative for cough, wheezing and stridor. ; ; Gastrointestinal: +N/V, abd pain. Negative for diarrhea, blood in stool, hematemesis, jaundice and rectal bleeding. . ; ; Genitourinary: Negative for dysuria, flank pain and hematuria. ; ; Musculoskeletal: +"cramping." Negative for back pain and neck pain. Negative for swelling and trauma.; ; Skin: Negative for pruritus, rash, abrasions, blisters, bruising and skin lesion.; ; Neuro: Negative for headache, lightheadedness and neck stiffness. Negative for extremity weakness, paresthesias, involuntary movement, seizure and +generalized weakness, +syncope.      Physical Exam Updated Vital Signs BP 114/67 (BP Location: Right Arm)    Pulse 85    Temp 98.1 F (36.7 C)    Resp 20    Ht 6\' 1"  (1.854 m)    Wt 115.7 kg    SpO2 100%    BMI 33.64 kg/m    20:57 Orthostatic Vital Signs AH  Orthostatic Lying   BP- Lying: 137/79  Pulse- Lying: 83      Orthostatic Sitting  BP- Sitting: 141/90  Pulse- Sitting: 88      Orthostatic Standing at 0 minutes  BP- Standing at 0 minutes: 112/72  Pulse- Standing at 0 minutes: 98      Orthostatic Standing at 3 minutes  BP- Standing at 3 minutes: (not able to perform)     Physical Exam 2045: Physical examination:  Nursing notes reviewed; Vital signs and O2 SAT reviewed;  Constitutional: Well developed, Well nourished, Well hydrated, In no acute distress; Head:  Normocephalic, atraumatic; Eyes: EOMI, PERRL, No scleral icterus; ENMT: Mouth and pharynx normal, Mucous membranes moist; Neck: Supple, Full range of motion, No lymphadenopathy; Cardiovascular: Regular rate and rhythm, No gallop; Respiratory: Breath sounds clear & equal bilaterally, No wheezes.  Speaking full sentences with ease, Normal respiratory effort/excursion; Chest: Nontender, Movement normal; Abdomen: Soft, +LUQ, LLQ tender to palp. No rebound or guarding. Nondistended, Normal bowel sounds; Genitourinary: No CVA tenderness;  Extremities: Peripheral pulses normal, +thrill LUE AV fistula. No tenderness, No edema, No calf edema or asymmetry.; Neuro: AA&Ox3, Major CN grossly intact.  Speech clear. No gross focal motor or sensory deficits in extremities.; Skin: Color normal, Warm, Dry.     ED Treatments / Results  Labs (all labs ordered are listed, but only abnormal results are displayed)   EKG EKG Interpretation  Date/Time:  Friday August 05 2019 17:31:13 EDT  #1 Ventricular Rate:  86 PR Interval:  152 QRS Duration: 132 QT Interval:  430 QTC Calculation: 514 R Axis:   -66 Text Interpretation:  Normal sinus rhythm with sinus arrhythmia Left axis deviation Left ventricular hypertrophy with QRS widening Cannot rule out Septal infarct , age undetermined Left bundle branch block When compared with ECG of 09/20/2018 No significant change was found Confirmed by Francine Graven [phone]) on 08/05/2019 8:52:09 PM    EKG Interpretation  Date/Time:  Friday August 05 2019 20:53:43 EDT  #2 Ventricular Rate:  85 PR Interval:  152 QRS Duration: 139 QT Interval:  414 QTC Calculation: 493 R Axis:   -80 Text Interpretation:  Sinus rhythm Left bundle branch block Left axis deviation Since last tracing of earlier today No significant change was found No significant change was found Confirmed by Francine Graven [phone]) on 08/05/2019 9:10:50 PM         Radiology   Procedures Procedures (including critical care time)  Medications Ordered in ED Medications  sodium chloride flush (NS) 0.9 % injection 3 mL (has no administration in time range)     Initial Impression / Assessment and Plan / ED Course  I have reviewed the triage vital signs and the nursing notes.  Pertinent labs & imaging results that were available during my care of the patient were reviewed by me and considered in my medical decision making (see chart for details).     MDM Reviewed: previous chart, nursing note and vitals Reviewed  previous: labs and ECG Interpretation: labs, ECG, x-ray and CT scan    Results for orders placed or performed during the hospital encounter of [id number]  Basic metabolic panel  Result Value Ref Range   Sodium 133 (L) 135 - 145 mmol/L   Potassium 4.3 3.5 - 5.1 mmol/L   Chloride 91 (L) 98 - 111 mmol/L   CO2 27 22 - 32 mmol/L   Glucose, Bld 211 (H) 70 - 99 mg/dL   BUN 24 (H) 6 - 20 mg/dL   Creatinine, Ser 7.80 (H) 0.61 - 1.24 mg/dL   Calcium 9.5 8.9 - 10.3 mg/dL   GFR calc non Af Amer 7 (L) >60 mL/min   GFR calc Af Amer 8 (L) >60 mL/min   Anion gap 15 5 - 15  CBC with Differential  Result Value Ref Range   WBC 8.8 4.0 - 10.5 K/uL   RBC 5.34 4.22 - 5.81 MIL/uL   Hemoglobin 16.9 13.0 - 17.0 g/dL   HCT 53.8 (H) 39.0 - 52.0 %   MCV 100.7 (H) 80.0 - 100.0 fL   MCH 31.6 26.0 - 34.0 pg   MCHC 31.4 30.0 - 36.0 g/dL   RDW 16.4 (H) 11.5 - 15.5 %   Platelets 195 150 - 400 K/uL   nRBC 0.0 0.0 - 0.2 %   Neutrophils Relative % 63 %   Neutro Abs 5.6 1.7 - 7.7 K/uL   Lymphocytes Relative 18 %   Lymphs Abs 1.6 0.7 - 4.0 K/uL   Monocytes Relative 14 %   Monocytes Absolute 1.2 (H) 0.1 - 1.0 K/uL   Eosinophils Relative 3 %   Eosinophils Absolute 0.3 0.0 - 0.5 K/uL   Basophils Relative 1 %   Basophils Absolute 0.1 0.0 - 0.1 K/uL   Immature Granulocytes 1 %   Abs Immature Granulocytes 0.04 0.00 - 0.07 K/uL  CBG monitoring, ED  Result Value Ref Range   Glucose-Capillary 143 (H) 70 - 99 mg/dL   Comment 1 Notify RN     2315:  Pt appeared unsteady during orthostatic VS. Trop x2 and CT's pending. Sign out to Dr. Tomi Bamberger.   Final Clinical Impressions(s) / ED Diagnoses   Final diagnoses:  None    ED Discharge Orders    None       Francine Graven, DO 08/06/19 1555

## 2019-08-05 NOTE — ED Notes (Signed)
Orthostatic vitals done at this time. Upon trying to stand for 3 minutes patient seemed wobbly and unsteady. Placed patient back in bed.

## 2019-08-05 NOTE — ED Notes (Addendum)
Pt family brought fast food to give pt -who is sitting in [place] behind triage- informed pt that I could not give him this food, as he is not supposed to eat before seeing EDP. Informed pt he could go out to regular [place] if he wants to, but I could not bring him food.

## 2019-08-06 NOTE — ED Notes (Signed)
Meal given to pt that was provided by family, Ginger Ale given

## 2019-08-06 NOTE — Discharge Instructions (Addendum)
Recheck as needed °

## 2019-08-06 NOTE — ED Provider Notes (Signed)
Patient left a change of shift to get results of CT scan.  He states today while on dialysis he started getting left-sided abdominal cramping pain that he has had before with dialysis.  He states they took off the usual amount of fluid today.  He states usually they stop early with the dialysis and it goes away quickly however today it has continued although he states it is improving.  He states today he passed out on dialysis and his blood pressure was low.  He states his nausea is gone and he actually wants to eat.  He denies diarrhea.  We discussed his CT result.  Patient was allowed to eat the food his family had brought him.  Recheck 1:25 AM patient states he feels fine now.  He feels ready to be discharged.  His delta troponins although mildly elevated are without significant change.  Ct Abdomen Pelvis Wo Contrast  Result Date: 08/05/2019 CLINICAL DATA:  Syncope with abdominal pain EXAM: CT ABDOMEN AND PELVIS WITHOUT CONTRAST TECHNIQUE: Multidetector CT imaging of the abdomen and pelvis was performed following the standard protocol without IV contrast. COMPARISON:  09/23/2018 FINDINGS: Lower chest: Lung bases demonstrate no acute consolidation or effusion. Heart size is normal. Coronary vascular calcification. Small amount of left gynecomastia. Hepatobiliary: No focal liver abnormality is seen. No gallstones, gallbladder wall thickening, or biliary dilatation. Pancreas: Unremarkable. No pancreatic ductal dilatation or surrounding inflammatory changes. Spleen: Normal in size without focal abnormality. Adrenals/Urinary Tract: Adrenal glands are normal. Slightly atrophic kidneys with prominent intrarenal vascular calcification. No hydronephrosis. Possible punctate stone lower pole right kidney. The bladder is thick walled but nearly empty Stomach/Bowel: Stomach is within normal limits. Appendix appears normal. No evidence of bowel wall thickening, distention, or inflammatory changes. Vascular/Lymphatic:  Moderate aortic atherosclerosis. No significantly enlarged lymph nodes. No aneurysm Reproductive: Prostate is unremarkable. Other: Negative for free air or free fluid Musculoskeletal: Grade 1 anterolisthesis L4 on L5 with chronic L4 pars defect. Degenerative changes. IMPRESSION: 1. No CT evidence for acute intra-abdominal or pelvic abnormality. 2. Slightly atrophic kidneys. Slightly thick-walled urinary bladder, likely due to under distension Electronically Signed   By: Donavan Foil M.D.   On: 08/05/2019 23:01   Dg Chest 2 View  Result Date: 08/05/2019 CLINICAL DATA:  Nausea vomiting and abdominal pain EXAM: CHEST - 2 VIEW COMPARISON:  09/20/2018 FINDINGS: The heart size and mediastinal contours are within normal limits. Both lungs are clear. Mild degenerative changes of the spine. IMPRESSION: No active cardiopulmonary disease. Electronically Signed   By: Donavan Foil M.D.   On: 08/05/2019 22:41   Ct Head Wo Contrast  Result Date: 08/05/2019 CLINICAL DATA:  Syncope EXAM: CT HEAD WITHOUT CONTRAST TECHNIQUE: Contiguous axial images were obtained from the base of the skull through the vertex without intravenous contrast. COMPARISON:  Report 02/06/2008 FINDINGS: Brain: No acute territorial infarction, hemorrhage or intracranial mass. Chronic appearing lacunar infarcts in the bilateral caudate. Age indeterminate hypodensity in the right thalamus and left basal ganglia. Mild small vessel ischemic change of the white matter. Normal ventricle size. Vascular: No hyperdense vessels.  Carotid vascular calcification Skull: Normal. Negative for fracture or focal lesion. Sinuses/Orbits: No acute finding. Other: None IMPRESSION: 1. Negative for acute intracranial hemorrhage or mass lesion 2. Suspected chronic lacunar infarcts in the bilateral caudate with age indeterminate hypodensity/lacunar infarcts in the right thalamus and left basal ganglia. 3. Mild small vessel ischemic changes of the white matter Electronically  Signed   By: Madie Reno.D.  On: 08/05/2019 22:56   Diagnoses that have been ruled out:  None  Diagnoses that are still under consideration:  None  Final diagnoses:  Abdominal cramping  Near syncope    Plan discharge  Rolland Porter, MD, Barbette Or, MD 08/06/19 [phone]

## 2019-08-12 ENCOUNTER — Encounter (HOSPITAL_COMMUNITY): Payer: Self-pay

## 2019-08-12 ENCOUNTER — Emergency Department (HOSPITAL_COMMUNITY): Payer: Medicare Other

## 2019-08-12 ENCOUNTER — Inpatient Hospital Stay (HOSPITAL_COMMUNITY)
Admission: EM | Admit: 2019-08-12 | Discharge: 2019-08-16 | DRG: 252 | Disposition: A | Discharge status: Home / Self Care | Payer: Medicare Other | Attending: Internal Medicine | Admitting: Internal Medicine

## 2019-08-12 ENCOUNTER — Other Ambulatory Visit: Payer: Self-pay

## 2019-08-12 DIAGNOSIS — D649 Anemia, unspecified: Secondary | ICD-10-CM | POA: Diagnosis present

## 2019-08-12 DIAGNOSIS — N186 End stage renal disease: Secondary | ICD-10-CM

## 2019-08-12 DIAGNOSIS — I447 Left bundle-branch block, unspecified: Secondary | ICD-10-CM

## 2019-08-12 DIAGNOSIS — T82510A Breakdown (mechanical) of surgically created arteriovenous fistula, initial encounter: Principal | ICD-10-CM | POA: Diagnosis present

## 2019-08-12 DIAGNOSIS — R0602 Shortness of breath: Secondary | ICD-10-CM

## 2019-08-12 DIAGNOSIS — I10 Essential (primary) hypertension: Secondary | ICD-10-CM | POA: Diagnosis present

## 2019-08-12 DIAGNOSIS — Z992 Dependence on renal dialysis: Secondary | ICD-10-CM

## 2019-08-12 DIAGNOSIS — K219 Gastro-esophageal reflux disease without esophagitis: Secondary | ICD-10-CM | POA: Diagnosis present

## 2019-08-12 DIAGNOSIS — I132 Hypertensive heart and chronic kidney disease with heart failure and with stage 5 chronic kidney disease, or end stage renal disease: Secondary | ICD-10-CM | POA: Diagnosis present

## 2019-08-12 DIAGNOSIS — Z794 Long term (current) use of insulin: Secondary | ICD-10-CM

## 2019-08-12 DIAGNOSIS — Z79899 Other long term (current) drug therapy: Secondary | ICD-10-CM

## 2019-08-12 DIAGNOSIS — Z6833 Body mass index (BMI) 33.0-33.9, adult: Secondary | ICD-10-CM

## 2019-08-12 DIAGNOSIS — Z789 Other specified health status: Secondary | ICD-10-CM | POA: Diagnosis present

## 2019-08-12 DIAGNOSIS — R9431 Abnormal electrocardiogram [ECG] [EKG]: Secondary | ICD-10-CM | POA: Diagnosis not present

## 2019-08-12 DIAGNOSIS — Z20828 Contact with and (suspected) exposure to other viral communicable diseases: Secondary | ICD-10-CM | POA: Diagnosis present

## 2019-08-12 DIAGNOSIS — I428 Other cardiomyopathies: Secondary | ICD-10-CM

## 2019-08-12 DIAGNOSIS — Z7982 Long term (current) use of aspirin: Secondary | ICD-10-CM

## 2019-08-12 DIAGNOSIS — E119 Type 2 diabetes mellitus without complications: Secondary | ICD-10-CM

## 2019-08-12 DIAGNOSIS — Y712 Prosthetic and other implants, materials and accessory cardiovascular devices associated with adverse incidents: Secondary | ICD-10-CM | POA: Diagnosis present

## 2019-08-12 DIAGNOSIS — T82590A Other mechanical complication of surgically created arteriovenous fistula, initial encounter: Secondary | ICD-10-CM | POA: Diagnosis not present

## 2019-08-12 DIAGNOSIS — E1122 Type 2 diabetes mellitus with diabetic chronic kidney disease: Secondary | ICD-10-CM | POA: Diagnosis present

## 2019-08-12 DIAGNOSIS — I5023 Acute on chronic systolic (congestive) heart failure: Secondary | ICD-10-CM

## 2019-08-12 DIAGNOSIS — E669 Obesity, unspecified: Secondary | ICD-10-CM | POA: Diagnosis present

## 2019-08-12 DIAGNOSIS — Z452 Encounter for adjustment and management of vascular access device: Secondary | ICD-10-CM

## 2019-08-12 DIAGNOSIS — T82868A Thrombosis of vascular prosthetic devices, implants and grafts, initial encounter: Secondary | ICD-10-CM

## 2019-08-12 DIAGNOSIS — I5022 Chronic systolic (congestive) heart failure: Secondary | ICD-10-CM | POA: Diagnosis present

## 2019-08-12 DIAGNOSIS — N2581 Secondary hyperparathyroidism of renal origin: Secondary | ICD-10-CM | POA: Diagnosis present

## 2019-08-12 DIAGNOSIS — E1165 Type 2 diabetes mellitus with hyperglycemia: Secondary | ICD-10-CM | POA: Diagnosis present

## 2019-08-12 DIAGNOSIS — N250 Renal osteodystrophy: Secondary | ICD-10-CM | POA: Diagnosis present

## 2019-08-12 LAB — BASIC METABOLIC PANEL
Anion gap: 17 — ABNORMAL HIGH (ref 5–15)
BUN: 59 mg/dL — ABNORMAL HIGH (ref 6–20)
CO2: 26 mmol/L (ref 22–32)
Calcium: 9.6 mg/dL (ref 8.9–10.3)
Chloride: 89 mmol/L — ABNORMAL LOW (ref 98–111)
Creatinine, Ser: 14.13 mg/dL — ABNORMAL HIGH (ref 0.61–1.24)
GFR calc Af Amer: 4 mL/min — ABNORMAL LOW (ref >60)
GFR calc non Af Amer: 3 mL/min — ABNORMAL LOW (ref >60)
Glucose, Bld: 262 mg/dL — ABNORMAL HIGH (ref 70–99)
Potassium: 4.8 mmol/L (ref 3.5–5.1)
Sodium: 132 mmol/L — ABNORMAL LOW (ref 135–145)

## 2019-08-12 LAB — CBC
HCT: 48 % (ref 39.0–52.0)
Hemoglobin: 15.5 g/dL (ref 13.0–17.0)
MCH: 32 pg (ref 26.0–34.0)
MCHC: 32.3 g/dL (ref 30.0–36.0)
MCV: 99 fL (ref 80.0–100.0)
Platelets: 180 10*3/uL (ref 150–400)
RBC: 4.85 MIL/uL (ref 4.22–5.81)
RDW: 15.8 % — ABNORMAL HIGH (ref 11.5–15.5)
WBC: 7.9 10*3/uL (ref 4.0–10.5)
nRBC: 0 % (ref 0.0–0.2)

## 2019-08-12 LAB — HEMOGLOBIN A1C
Hgb A1c MFr Bld: 9.9 % — ABNORMAL HIGH (ref 4.8–5.6)
Mean Plasma Glucose: 237.43 mg/dL

## 2019-08-12 LAB — TROPONIN I (HIGH SENSITIVITY)
Troponin I (High Sensitivity): 53 ng/L — ABNORMAL HIGH (ref <18)
Troponin I (High Sensitivity): 49 ng/L — ABNORMAL HIGH (ref <18)

## 2019-08-12 MED ORDER — SUCROFERRIC OXYHYDROXIDE 500 MG PO CHEW
1000.0000 mg | CHEWABLE_TABLET | Freq: Three times a day (TID) | ORAL | Status: DC
  Administered 2019-08-13 – 2019-08-16 (×6): 1000 mg via ORAL
  Filled 2019-08-12 (×8): qty 2

## 2019-08-12 MED ORDER — ATORVASTATIN CALCIUM 40 MG PO TABS
40.0000 mg | ORAL_TABLET | Freq: Every evening | ORAL | Status: DC
  Administered 2019-08-12 – 2019-08-15 (×3): 40 mg via ORAL
  Filled 2019-08-12 (×4): qty 1

## 2019-08-12 MED ORDER — AMLODIPINE BESYLATE 10 MG PO TABS
10.0000 mg | ORAL_TABLET | Freq: Every day | ORAL | Status: DC
  Administered 2019-08-15: 10 mg via ORAL
  Filled 2019-08-12: qty 1

## 2019-08-12 MED ORDER — CARVEDILOL 25 MG PO TABS
25.0000 mg | ORAL_TABLET | Freq: Two times a day (BID) | ORAL | Status: DC
  Administered 2019-08-12 – 2019-08-15 (×5): 25 mg via ORAL
  Filled 2019-08-12: qty 2
  Filled 2019-08-12 (×4): qty 1

## 2019-08-12 MED ORDER — LIDOCAINE-PRILOCAINE 2.5-2.5 % EX CREA: 1.0000 "application " | TOPICAL_CREAM | CUTANEOUS | Status: DC

## 2019-08-12 MED ORDER — SACUBITRIL-VALSARTAN 24-26 MG PO TABS
1.0000 | ORAL_TABLET | Freq: Two times a day (BID) | ORAL | Status: DC
  Administered 2019-08-12 – 2019-08-16 (×6): 1 via ORAL
  Filled 2019-08-12 (×8): qty 1

## 2019-08-12 MED ORDER — ACETAMINOPHEN 325 MG PO TABS
650.0000 mg | ORAL_TABLET | Freq: Four times a day (QID) | ORAL | Status: DC | PRN
  Administered 2019-08-13: 650 mg via ORAL
  Filled 2019-08-12: qty 2

## 2019-08-12 MED ORDER — INSULIN ASPART 100 UNIT/ML ~~LOC~~ SOLN
0.0000 [IU] | Freq: Three times a day (TID) | SUBCUTANEOUS | Status: DC
  Administered 2019-08-13: 7 [IU] via SUBCUTANEOUS
  Administered 2019-08-13: 2 [IU] via SUBCUTANEOUS
  Administered 2019-08-14: 1 [IU] via SUBCUTANEOUS
  Administered 2019-08-14: 5 [IU] via SUBCUTANEOUS
  Administered 2019-08-14: 12 [IU] via SUBCUTANEOUS
  Administered 2019-08-15: 1 [IU] via SUBCUTANEOUS
  Administered 2019-08-15 – 2019-08-16 (×2): 3 [IU] via SUBCUTANEOUS

## 2019-08-12 MED ORDER — ACETAMINOPHEN 650 MG RE SUPP: 650.0000 mg | Freq: Four times a day (QID) | RECTAL | Status: DC | PRN

## 2019-08-12 MED ORDER — INSULIN ASPART 100 UNIT/ML ~~LOC~~ SOLN
0.0000 [IU] | Freq: Every day | SUBCUTANEOUS | Status: DC
  Administered 2019-08-13: 3 [IU] via SUBCUTANEOUS
  Administered 2019-08-13: 5 [IU] via SUBCUTANEOUS

## 2019-08-12 MED ORDER — BISACODYL 5 MG PO TBEC: 5.0000 mg | DELAYED_RELEASE_TABLET | Freq: Every evening | ORAL | Status: DC | PRN

## 2019-08-12 MED ORDER — SODIUM CHLORIDE 0.9% FLUSH
3.0000 mL | Freq: Once | INTRAVENOUS | Status: AC
  Administered 2019-08-12: 3 mL via INTRAVENOUS

## 2019-08-12 MED ORDER — PANTOPRAZOLE SODIUM 20 MG PO TBEC
20.0000 mg | DELAYED_RELEASE_TABLET | Freq: Every day | ORAL | Status: DC
  Administered 2019-08-15 – 2019-08-16 (×2): 20 mg via ORAL
  Filled 2019-08-12 (×2): qty 1

## 2019-08-12 MED ORDER — FAMOTIDINE 20 MG PO TABS
20.0000 mg | ORAL_TABLET | Freq: Every day | ORAL | Status: DC
  Administered 2019-08-12 – 2019-08-15 (×4): 20 mg via ORAL
  Filled 2019-08-12 (×4): qty 1

## 2019-08-12 NOTE — Consult Note (Addendum)
Cardiology Consultation:   Patient ID: [name] CULLETON Sr. MRN: [id number]; DOB: 1961-05-05  Admit date: 08/12/2019 Date of Consult: 08/12/2019  Primary Care Provider: Patient, No Pcp Per Primary Cardiologist: No primary care provider on file. [facility] Justus [facility] at [facility] Primary Electrophysiologist:  None    Patient Profile:   [name] KOVACK Sr. is a 58 y.o. male with a hx of ESRD who is being seen today for the evaluation of abnromal ECG at the request of .  History of Present Illness:   [name] [name] gets dilaysis on MWF.  He was at dialysis and could not be accessed despite multiple sticks.  He was short of breath so he was sent to the ER.  His initial ECG had concern for STEMI ; however, it was reviewed and given LBBB and lack of significant change from prior ECG, no STEMI was activated.  Currently, he denies chest pain.  He has some mild SHOB.  He reports that he had a cath at another hospital but did not require PCI.  Records from [place] show that he has a nonischemic cardiomyopathy: "Nonischemic CM-via cardiac catheterization, LVEF-35% most likely etiology hypertension. NYHA class II symptoms, no HF exacerbations no HF hospitalizations. His volume is managed by dialysis, he is euvolemic on my exam. -continue entresto at current dose 24/26 mg bid -continue coreg 25 mg bid -continue ASA and atorvastatin 40 mg daily"  He also has DM, A1C was 11.2.  Heart Pathway Score:     Past Medical History:  Diagnosis Date  . Diabetes mellitus without complication ([place])   . ESRD on hemodialysis ([place])   . Hypertension   . Kidney failure   . Nonischemic cardiomyopathy [facility])     Past Surgical History:  Procedure Laterality Date  . AV FISTULA PLACEMENT    . IRRIGATION AND DEBRIDEMENT ABSCESS    . TOE AMPUTATION         Inpatient Medications: Scheduled Meds: . sodium chloride flush  3 mL Intravenous Once   Continuous Infusions:  PRN Meds:   Allergies:   No Known Allergies  Social  History:   Social History   Socioeconomic History  . Marital status: Divorced    Spouse name: Not on file  . Number of children: Not on file  . Years of education: Not on file  . Highest education level: Not on file  Occupational History  . Not on file  Social Needs  . Financial resource strain: Not on file  . Food insecurity    Worry: Not on file    Inability: Not on file  . Transportation needs    Medical: Not on file    Non-medical: Not on file  Tobacco Use  . Smoking status: Never Smoker  . Smokeless tobacco: Never Used  Substance and Sexual Activity  . Alcohol use: Not Currently  . Drug use: Never  . Sexual activity: Not on file  Lifestyle  . Physical activity    Days per week: Not on file    Minutes per session: Not on file  . Stress: Not on file  Relationships  . Social Herbalist on phone: Not on file    Gets together: Not on file    Attends religious service: Not on file    Active member of club or organization: Not on file    Attends meetings of clubs or organizations: Not on file    Relationship status: Not on file  . Intimate partner violence  Fear of current or ex partner: Not on file    Emotionally abused: Not on file    Physically abused: Not on file    Forced sexual activity: Not on file  Other Topics Concern  . Not on file  Social History Narrative  . Not on file    Family History:   No family history on file.  parents had HTN  ROS:  Please see the history of present illness.  Shortness of breath;  All other ROS reviewed and negative.     Physical Exam/Data:   Vitals:   08/12/19 1701  BP: (!) 167/111  Pulse: 86  Resp: 18  Temp: 98.4 F (36.9 C)  TempSrc: Oral  SpO2: 100%   No intake or output data in the 24 hours ending 08/12/19 1832 Last 3 Weights 08/05/2019 09/24/2018 09/24/2018  Weight (lbs) 255 lb 251 lb 5.2 oz 257 lb 4.4 oz  Weight (kg) 115.667 kg 114 kg 116.7 kg     There is no height or weight on file to  calculate BMI.  General:  Well nourished, well developed, mildly short of breath HEENT: normal Lymph: no adenopathy Neck: no JVD Endocrine:  No thryomegaly   Cardiac:  normal S1, S2; RRR; no murmur  Lungs:  clear to auscultation bilaterally, no wheezing, rhonchi or rales  Abd: soft, nontender, no hepatomegaly  Ext: tr LE edema Musculoskeletal:  No deformities, BUE and BLE strength normal and equal Skin: warm and dry  Neuro:  CNs 2-12 intact, no focal abnormalities noted Psych:  Normal affect   EKG:  The EKG was personally reviewed and demonstrates:  NSR, IVCD, ST changes in 1 and AVL Telemetry:  Telemetry was personally reviewed and demonstrates:  NSR  Relevant CV Studies: Records reviewed  Laboratory Data:  High Sensitivity Troponin:   Recent Labs  Lab 08/05/19 1848 08/05/19 2246 08/12/19 1728  TROPONINIHS 50* 47* 53*     Chemistry Recent Labs  Lab 08/05/19 1848 08/12/19 1708  NA 133* 132*  K 4.3 4.8  CL 91* 89*  CO2 27 26  GLUCOSE 211* 262*  BUN 24* 59*  CREATININE 7.80* 14.13*  CALCIUM 9.5 9.6  GFRNONAA 7* 3*  GFRAA 8* 4*  ANIONGAP 15 17*    No results for input(s): PROT, ALBUMIN, AST, ALT, ALKPHOS, BILITOT in the last 168 hours. Hematology Recent Labs  Lab 08/05/19 1848 08/12/19 1708  WBC 8.8 7.9  RBC 5.34 4.85  HGB 16.9 15.5  HCT 53.8* 48.0  MCV 100.7* 99.0  MCH 31.6 32.0  MCHC 31.4 32.3  RDW 16.4* 15.8*  PLT 195 180   BNPNo results for input(s): BNP, PROBNP in the last 168 hours.  DDimer No results for input(s): DDIMER in the last 168 hours.   Radiology/Studies:  Dg Chest 2 View  Result Date: 08/12/2019 CLINICAL DATA:  Shortness of breath EXAM: CHEST - 2 VIEW COMPARISON:  08/05/2019 FINDINGS: Heart and mediastinal contours are within normal limits. No focal opacities or effusions. No acute bony abnormality. IMPRESSION: No active cardiopulmonary disease. Electronically Signed   By: Rolm Baptise M.D.   On: 08/12/2019 17:32    Assessment  and Plan:   1. SHOB in the setting of likely volume overload.  NICM- volume managed by dialysis.  Low salt diet.  Avoid sodas and processed foods.   2. [place] zone troponin- stable.  No ischemia w/u planned.  Continue home meds.  F/u at [facility] as planned.      For questions or updates,  please contact [place] Please consult [email or web address] for contact info under     Signed, Larae Grooms, MD  08/12/2019 6:32 PM

## 2019-08-12 NOTE — ED Notes (Signed)
[name] [phone]

## 2019-08-12 NOTE — ED Provider Notes (Signed)
I was asked to interpret a EKG from triage.  [name] [name] experiencing shortness of breath in the setting of missed dialysis.  Upon my interpretation there appeared to be new and concordant ST elevations in lead I and aVL (left bundle branch block).  Discussed this with cardiology on-call who reviewed the EKG. STEMI criteria currently not met, will continue with medical management.   Maudie Flakes, MD 08/12/19 1726

## 2019-08-12 NOTE — Consult Note (Signed)
Vascular and Vein Specialist of [place]  Patient name: [name] CASTLEBERRY Sr. MRN: [id number] DOB: 1961/01/13 Sex: male    HPI: [name] BICK Sr. is a 58 y.o. male seen at the request of emergency room physician regarding difficulty with access on his left upper arm AV fistula.  He is in no distress and is just finishing up a large [employer] meal.  Apparently he was at hemodialysis today and had difficulty with accessing his fistula.  He reports that he normally does not have trouble but was clotting the machine at Wednesday's session.  There was some question of shortness of breath and therefore he was sent to the emergency room.  I have seen cardiology evaluation he does not feel that he is having any cardiac issue currently.  He is in no respiratory distress currently.  His fistula was placed at an outlying hospital prior to moving to this area.  We do not have any data on this.  He reports that he is never had surgical revision of the fistula but has had endovascular treatment.  Past Medical History:  Diagnosis Date  . Diabetes mellitus without complication ([place])   . ESRD on hemodialysis ([place])   . Hypertension   . Kidney failure   . Nonischemic cardiomyopathy ([place])     No family history on file.  SOCIAL HISTORY: Social History   Tobacco Use  . Smoking status: Never Smoker  . Smokeless tobacco: Never Used  Substance Use Topics  . Alcohol use: Not Currently    No Known Allergies  No current facility-administered medications for this encounter.    Current Outpatient Medications  Medication Sig Dispense Refill  . amLODipine (NORVASC) 10 MG tablet Take 10 mg by mouth daily.    Marland Kitchen aspirin 81 MG chewable tablet Chew 81 mg by mouth daily.  1  . atorvastatin (LIPITOR) 40 MG tablet Take 40 mg by mouth every evening.   2  . bisacodyl (DULCOLAX) 5 MG EC tablet Take 5 mg by mouth at bedtime as needed for mild constipation or moderate constipation.     . carvedilol (COREG) 25 MG tablet Take 25 mg by mouth 2 (two) times daily.  3  . ENTRESTO 24-26 MG Take 1 tablet by mouth 2 (two) times daily.  3  . HUMULIN 70/30 (70-30) 100 UNIT/ML injection Inject 6-20 Units into the skin See admin instructions. At bedtime:: Prescribed 20 units in the morning and 6 units in the evening  12  . lidocaine-prilocaine (EMLA) cream Apply 1 application topically every Monday, Wednesday, and Friday. At dialysis  3  . omeprazole (PRILOSEC) 20 MG capsule Take 20 mg by mouth daily before breakfast.    . sucroferric oxyhydroxide (VELPHORO) 500 MG chewable tablet Chew 1,000 mg by mouth 3 (three) times daily with meals.    . pantoprazole (PROTONIX) 20 MG tablet Take 20 mg by mouth daily.     Marland Kitchen RENVELA 2.4 g PACK VIGOROUSLY MIX CONTENTS OF 1 PACKET IN WATER (IT WILL NOT DISSOLVE) AND DRINK 3 TIMES DAILY WITH MEALS  12  . sildenafil (VIAGRA) 100 MG tablet Take 100 mg by mouth daily as needed for erectile dysfunction.   0  . sodium bicarbonate 650 MG tablet Take 1 tablet by mouth 4 (four) times daily.  11    REVIEW OF SYSTEMS:  [X]  denotes positive finding, [ ]  denotes negative finding Cardiac  Comments:  Chest pain or chest pressure:    Shortness of breath upon exertion: x   Short of breath when lying flat:    Irregular heart rhythm:        Vascular    Pain in calf, thigh, or hip brought on by ambulation:    Pain in feet at night that wakes you up from your sleep:     Blood clot in your veins:    Leg swelling:           PHYSICAL EXAM: Vitals:   08/12/19 1701 08/12/19 1815 08/12/19 1830 08/12/19 1915  BP: (!) 167/111 (!) 170/98 (!) 185/94   Pulse: 86 84 86   Resp: 18 18 19    Temp: 98.4 F (36.9 C)     TempSrc: Oral     SpO2: 100% 97% 95%   Weight:    115.7 kg  Height:    6\' 1"  (1.854 m)    GENERAL: The patient is a well-nourished male, in no acute distress. The vital signs are documented above. CARDIOVASCULAR: His fistula is patent.  It is very  well-developed mature brachiocephalic fistula.  There is some tortuosity.  He does have very pulsatile nature of the fistula with some thrill in the upper portion of his arm. PULMONARY: There is good air exchange  MUSCULOSKELETAL: There are no major deformities or cyanosis. NEUROLOGIC: No focal weakness or paresthesias are detected. SKIN: There are no ulcers or rashes noted. PSYCHIATRIC: The patient has a normal affect.  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  Will need fistulogram and possible intervention.  His potassium is stable.  Nephrology has been notified as well.  If it is felt that he needs urgent dialysis, temporary catheter could be placed and he can have fistulogram next week.  Will see again in the morning to determine plan for access    [name], MD [facility] Vascular and Vein Specialists of [facility] Tel [phone] Pager [phone]

## 2019-08-12 NOTE — ED Provider Notes (Signed)
[facility] Emergency Department Provider Note MRN:  [id number]  Arrival date & time: 08/12/19     Chief Complaint   Shortness of Breath   History of Present Illness   LYDEN DEASY Sr. is a 58 y.o. year-old male with a history of diabetes, ESRD presenting to the ED with chief complaint of shortness of breath.  Patient is here because he was unable to receive dialysis today.  There was an issue with his AV fistula, they attempted to access it 7 times but were unable to do so successfully.  He is endorsing mild shortness of breath, which she experiences regularly prior to receiving dialysis on his normal Monday Wednesday Friday schedule.  He denies chest pain, no fever.  Recent increased cough, no abdominal pain, no other complaints.  Review of Systems  A complete 10 system review of systems was obtained and all systems are negative except as noted in the HPI and PMH.   Patient's Health History    Past Medical History:  Diagnosis Date  . Diabetes mellitus without complication ([place])   . ESRD on hemodialysis ([place])   . Hypertension   . Kidney failure   . Nonischemic cardiomyopathy [facility])     Past Surgical History:  Procedure Laterality Date  . AV FISTULA PLACEMENT    . IRRIGATION AND DEBRIDEMENT ABSCESS    . TOE AMPUTATION      No family history on file.  Social History   Socioeconomic History  . Marital status: Divorced    Spouse name: Not on file  . Number of children: Not on file  . Years of education: Not on file  . Highest education level: Not on file  Occupational History  . Not on file  Social Needs  . Financial resource strain: Not on file  . Food insecurity    Worry: Not on file    Inability: Not on file  . Transportation needs    Medical: Not on file    Non-medical: Not on file  Tobacco Use  . Smoking status: Never Smoker  . Smokeless tobacco: Never Used  Substance and Sexual Activity  . Alcohol use: Not Currently  . Drug use:  Never  . Sexual activity: Not on file  Lifestyle  . Physical activity    Days per week: Not on file    Minutes per session: Not on file  . Stress: Not on file  Relationships  . Social Herbalist on phone: Not on file    Gets together: Not on file    Attends religious service: Not on file    Active member of club or organization: Not on file    Attends meetings of clubs or organizations: Not on file    Relationship status: Not on file  . Intimate partner violence    Fear of current or ex partner: Not on file    Emotionally abused: Not on file    Physically abused: Not on file    Forced sexual activity: Not on file  Other Topics Concern  . Not on file  Social History Narrative  . Not on file     Physical Exam  Vital Signs and Nursing Notes reviewed Vitals:   08/12/19 1815 08/12/19 1830  BP: (!) 170/98 (!) 185/94  Pulse: 84 86  Resp: 18 19  Temp:    SpO2: 97% 95%    CONSTITUTIONAL: Well-appearing, NAD NEURO:  Alert and oriented x 3, no focal deficits EYES:  eyes equal and reactive ENT/NECK:  no LAD, no JVD CARDIO: Regular rate, well-perfused, normal S1 and S2 PULM:  CTAB no wheezing or rhonchi GI/GU:  normal bowel sounds, non-distended, non-tender MSK/SPINE:  No gross deformities, no edema SKIN:  no rash, atraumatic PSYCH:  Appropriate speech and behavior  Diagnostic and Interventional Summary    EKG Interpretation  Date/Time:  Friday August 12 2019 17:01:46 EDT Ventricular Rate:  88 PR Interval:  150 QRS Duration: 132 QT Interval:  392 QTC Calculation: 474 R Axis:   -78 Text Interpretation:  Sinus rhythm with Premature atrial complexes Left axis deviation Non-specific intra-ventricular conduction block Cannot rule out Septal infarct , age undetermined T wave abnormality, consider lateral ischemia Abnormal ECG Confirmed by Gerlene Fee [phone]) on 08/12/2019 5:21:41 PM      Labs Reviewed  BASIC METABOLIC PANEL - Abnormal; Notable for the  following components:      Result Value   Sodium 132 (*)    Chloride 89 (*)    Glucose, Bld 262 (*)    BUN 59 (*)    Creatinine, Ser 14.13 (*)    GFR calc non Af Amer 3 (*)    GFR calc Af Amer 4 (*)    Anion gap 17 (*)    All other components within normal limits  CBC - Abnormal; Notable for the following components:   RDW 15.8 (*)    All other components within normal limits  TROPONIN I (HIGH SENSITIVITY) - Abnormal; Notable for the following components:   Troponin I (High Sensitivity) 53 (*)    All other components within normal limits  TROPONIN I (HIGH SENSITIVITY) - Abnormal; Notable for the following components:   Troponin I (High Sensitivity) 49 (*)    All other components within normal limits  SARS CORONAVIRUS 2 (TAT 6-24 HRS)    DG Chest 2 View  Final Result      Medications  sodium chloride flush (NS) 0.9 % injection 3 mL (3 mLs Intravenous Given 08/12/19 1954)     Procedures Critical Care  ED Course and Medical Decision Making  I have reviewed the triage vital signs and the nursing notes.  Pertinent labs & imaging results that were available during my care of the patient were reviewed by me and considered in my medical decision making (see below for details).  Vascular access issue in this 58 year old male, ESRD, will consult nephrology to troubleshoot this issue with his AV fistula.  May need to discuss with vascular surgery as well.  Patient had some initial subtle findings on EKG that were discussed with cardiology, he is in no acute distress, no chest pain, will obtain second troponin.  His shortness of breath is mild and thought to be related to his fluid status.  Work-up is largely unrevealing.  Patient's case was discussed with nephrology, who recommends discussions with vascular surgery.  Patient was evaluated by Dr. early in the emergency department, who is concerned that he has upstream issues with the AV fistula that does not need emergent intervention but  he feels it is unlikely that there will be successful cannulation of this AV fistula.  Therefore, will admit to medicine service for facilitation of HD access.  Nephrology and vascular surgery willing to provide care in consultation tomorrow morning.    Barth Kirks. [place]onia Small, [place] [email or web address]  Final Clinical Impressions(s) / ED Diagnoses     ICD-10-CM   1. Problem with vascular access  Z78.9  2. ESRD on dialysis ([place])  N18.6    Z99.2   3. SOB (shortness of breath)  R06.02     ED Discharge Orders    None      Discharge Instructions Discussed with and Provided to Patient: Discharge Instructions   None       Maudie Flakes, MD 08/12/19 2054

## 2019-08-12 NOTE — H&P (Signed)
History and Physical    [name] BOSMAN Sr. [id number] DOB: [birth year] DOA: 08/12/2019  PCP: Patient, No Pcp Per Patient coming from: Home  Chief Complaint: AV fistula malfunction, shortness of breath  HPI: [name] STELLWAGEN Sr. is a 58 y.o. male with medical history significant of ESRD on HD MWF, hypertension, nonischemic cardiomyopathy, type 2 diabetes presenting to the hospital for evaluation of left upper extremity AV fistula malfunction and shortness of breath.  Patient states he went for dialysis today and he started him 7 times but his AV fistula was not working.  They were not able to give him dialysis.  States he is feeling short of breath because he has fluid on him and wants dialysis.  Reports having a chronic cough, no recent change.  Denies chest pain.  Denies fevers or chills.  No other complaints.  ED Course: Not tachypneic or hypoxic.  SARS-CoV-2 test pending.  Chest x-ray showing no active cardiopulmonary disease. EKG done in the ED with left bundle branch block and cardiology was consulted.  High-sensitivity troponin 53> 49.  ACS ruled out and cardiology is not recommending ischemic work-up.  Recommending continuing with medical management. ED provider discussed the case with nephrology who recommended vascular surgery consultation.  Vascular surgery was consulted.  Nephrology will see the patient in the morning.  Review of Systems:  All systems reviewed and apart from history of presenting illness, are negative.  Past Medical History:  Diagnosis Date  . Diabetes mellitus without complication ([place])   . ESRD on hemodialysis ([place])   . Hypertension   . Kidney failure   . Nonischemic cardiomyopathy [facility])     Past Surgical History:  Procedure Laterality Date  . AV FISTULA PLACEMENT    . IRRIGATION AND DEBRIDEMENT ABSCESS    . TOE AMPUTATION       reports that he has never smoked. He has never used smokeless tobacco. He reports previous alcohol use. He reports that he does  not use drugs.  Not on File  History reviewed. No pertinent family history.  Prior to Admission medications   Medication Sig Start Date End Date Taking? Authorizing Provider  amLODipine (NORVASC) 10 MG tablet Take 10 mg by mouth daily.   Yes [provider]  aspirin 81 MG chewable tablet Chew 81 mg by mouth daily. 09/01/18  Yes [provider]  atorvastatin (LIPITOR) 40 MG tablet Take 40 mg by mouth every evening.  09/01/18  Yes [provider]  bisacodyl (DULCOLAX) 5 MG EC tablet Take 5 mg by mouth at bedtime as needed for mild constipation or moderate constipation.   Yes [provider]  carvedilol (COREG) 25 MG tablet Take 25 mg by mouth 2 (two) times daily. 08/31/18  Yes [provider]  ENTRESTO 24-26 MG Take 1 tablet by mouth 2 (two) times daily. 07/29/18  Yes [provider]  famotidine (PEPCID) 20 MG tablet Take 20 mg by mouth 2 (two) times daily.   Yes [provider]  HUMULIN 70/30 (70-30) 100 UNIT/ML injection Inject 6-20 Units into the skin See admin instructions. Inject 20 units into the skin in the morning and 6 units in the evening 08/31/18  Yes [provider]  lidocaine-prilocaine (EMLA) cream Apply 1 application topically every Monday, Wednesday, and Friday with hemodialysis.  08/17/18  Yes [provider]  omeprazole (PRILOSEC) 20 MG capsule Take 20 mg by mouth daily before breakfast.   Yes [provider]  sucroferric oxyhydroxide (VELPHORO) 500 MG chewable  tablet Chew 1,000 mg by mouth 3 (three) times daily with meals.   Yes [provider]  sildenafil (VIAGRA) 100 MG tablet Take 100 mg by mouth daily as needed for erectile dysfunction.  09/02/18   [provider]    Physical Exam: Vitals:   08/13/19 0030 08/13/19 0100 08/13/19 0136 08/13/19 0452  BP: (!) 179/91 (!) 163/91 (!) 147/81 137/75  Pulse: 83 83 92 73  Resp: (!) 21 18 18 19   Temp:   98.6 F (37 C) 98.2  F (36.8 C)  TempSrc:   Oral   SpO2: 97% 97% 97% 96%  Weight:      Height:        Physical Exam  Constitutional: He is oriented to person, place, and time. He appears well-developed and well-nourished. No distress.  Resting comfortably watching television  HENT:  Head: Normocephalic.  Mouth/Throat: Oropharynx is clear and moist.  Eyes: Right eye exhibits no discharge. Left eye exhibits no discharge.  Neck: Neck supple.  Cardiovascular: Normal rate, regular rhythm and intact distal pulses.  Pulmonary/Chest: Effort normal and breath sounds normal. No respiratory distress. He has no wheezes. He has no rales.  Abdominal: Soft. Bowel sounds are normal. He exhibits no distension. There is no abdominal tenderness. There is no guarding.  Musculoskeletal:        General: No edema.  Neurological: He is alert and oriented to person, place, and time.  Skin: Skin is warm and dry. He is not diaphoretic.     Labs on Admission: I have personally reviewed following labs and imaging studies  CBC: Recent Labs  Lab 08/12/19 1708  WBC 7.9  HGB 15.5  HCT 48.0  MCV 99.0  PLT [id number]   Basic Metabolic Panel: Recent Labs  Lab 08/12/19 1708  NA 132*  K 4.8  CL 89*  CO2 26  GLUCOSE 262*  BUN 59*  CREATININE 14.13*  CALCIUM 9.6   GFR: Estimated Creatinine Clearance: 7.8 mL/min (A) (by C-G formula based on SCr of 14.13 mg/dL (H)). Liver Function Tests: No results for input(s): AST, ALT, ALKPHOS, BILITOT, PROT, ALBUMIN in the last 168 hours. No results for input(s): LIPASE, AMYLASE in the last 168 hours. No results for input(s): AMMONIA in the last 168 hours. Coagulation Profile: No results for input(s): INR, PROTIME in the last 168 hours. Cardiac Enzymes: No results for input(s): CKTOTAL, CKMB, CKMBINDEX, TROPONINI in the last 168 hours. BNP (last 3 results) No results for input(s): PROBNP in the last 8760 hours. HbA1C: Recent Labs    08/12/19 1708  HGBA1C 9.9*   CBG: Recent Labs   Lab 08/13/19 0008  GLUCAP 367*   Lipid Profile: No results for input(s): CHOL, HDL, LDLCALC, TRIG, CHOLHDL, LDLDIRECT in the last 72 hours. Thyroid Function Tests: No results for input(s): TSH, T4TOTAL, FREET4, T3FREE, THYROIDAB in the last 72 hours. Anemia Panel: No results for input(s): VITAMINB12, FOLATE, FERRITIN, TIBC, IRON, RETICCTPCT in the last 72 hours. Urine analysis:    Component Value Date/Time   COLORURINE YELLOW 09/20/2018 2118   APPEARANCEUR CLEAR 09/20/2018 2118   LABSPEC 1.012 09/20/2018 2118   PHURINE 8.0 09/20/2018 2118   GLUCOSEU >=500 (A) 09/20/2018 2118   HGBUR NEGATIVE 09/20/2018 2118   BILIRUBINUR NEGATIVE 09/20/2018 2118   KETONESUR NEGATIVE 09/20/2018 2118   PROTEINUR >=300 (A) 09/20/2018 2118   NITRITE NEGATIVE 09/20/2018 2118   LEUKOCYTESUR SMALL (A) 09/20/2018 2118    Radiological Exams on Admission: Dg Chest 2 View  Result Date: 08/12/2019 CLINICAL  DATA:  Shortness of breath EXAM: CHEST - 2 VIEW COMPARISON:  08/05/2019 FINDINGS: Heart and mediastinal contours are within normal limits. No focal opacities or effusions. No acute bony abnormality. IMPRESSION: No active cardiopulmonary disease. Electronically Signed   By: Rolm Baptise M.D.   On: 08/12/2019 17:32    EKG: Independently reviewed.  Sinus rhythm, left bundle branch block.  Assessment/Plan Principal Problem:   Dialysis AV fistula malfunction (HCC) Active Problems:   ESRD (end stage renal disease) (HCC)   DM2 (diabetes mellitus, type 2) (HCC)   HTN (hypertension)   LBBB (left bundle branch block)   Left upper arm AV fistula malfunction ESRD on HD MWF Patient was unable to get dialysis yesterday due to AV fistula malfunction.  He is endorsing shortness of breath but appears comfortable.  Not tachypneic or hypoxic.  Does not appear volume overloaded on exam.  Chest x-ray without pulmonary edema.  SARS-CoV-2 test negative.  No significant electrolyte derangements.  Sodium 133, potassium  4.3, and bicarb 27. -Patient was seen by vascular surgery and is thought to have probable proximal stenosis in the left upper arm AV fistula with difficulty with cannulation and clotting of the access.  Patient will need fistulogram and possible intervention.  Recommending temporary catheter if patient needs urgent dialysis and can have fistulogram next week.  Vascular surgery will follow along. -Nephrology consulted and will see the patient in the morning. -Renal function panel in a.m. -Continue home [facility]  Left bundle branch block EKG with left bundle branch block and cardiology was consulted in the ED.  High-sensitivity troponin 53> 49.  ACS ruled out and cardiology is not recommending ischemic work-up. -Continue home Coreg, Entresto -Patient will likely need temporary hemodialysis catheter insertion, will hold home aspirin at this time to reduce the risk of bleeding.  Consider resuming aspirin after the procedure.  Hypertension -Continue home amlodipine, Coreg, Entresto  Insulin-dependent diabetes mellitus -Sliding scale insulin sensitive and CBG checks.  GERD -Continue home PPI and H2 blocker  DVT prophylaxis: SCDs at this time Code Status: Patient wishes to be full code. Family Communication: No family available. Disposition Plan: Anticipate discharge after clinical improvement. Consults called: Vascular surgery, nephrology Admission status: It is my clinical opinion that referral for OBSERVATION is reasonable and necessary in this patient based on the above information provided. The aforementioned taken together are felt to place the patient at high risk for further clinical deterioration. However it is anticipated that the patient may be medically stable for discharge from the hospital within 24 to 48 hours.  The medical decision making on this patient was of high complexity and the patient is at high risk for clinical deterioration, therefore this is a level 3 visit.   Shela Leff MD [facility] Pager [phone]  If 7PM-7AM, please contact night-coverage [email or web address] Password [email or web address]  08/13/2019, 5:14 AM

## 2019-08-12 NOTE — ED Triage Notes (Signed)
Pt c.o sob due to not being able to receive dialysis today. States the staff stuck his fistula 7 times and could not get it to work. Last dialysis treatment Wednesday. Resp e.u at this time.

## 2019-08-13 ENCOUNTER — Other Ambulatory Visit: Payer: Self-pay

## 2019-08-13 ENCOUNTER — Encounter (HOSPITAL_COMMUNITY)
Admission: EM | Disposition: A | Discharge status: Home / Self Care | Payer: Self-pay | Source: Home / Self Care | Attending: Internal Medicine

## 2019-08-13 ENCOUNTER — Observation Stay (HOSPITAL_COMMUNITY): Payer: Medicare Other | Admitting: Certified Registered Nurse Anesthetist

## 2019-08-13 ENCOUNTER — Observation Stay (HOSPITAL_COMMUNITY): Payer: Medicare Other

## 2019-08-13 ENCOUNTER — Encounter (HOSPITAL_COMMUNITY): Payer: Self-pay | Admitting: *Deleted

## 2019-08-13 DIAGNOSIS — Z79899 Other long term (current) drug therapy: Secondary | ICD-10-CM | POA: Diagnosis not present

## 2019-08-13 DIAGNOSIS — Z794 Long term (current) use of insulin: Secondary | ICD-10-CM | POA: Diagnosis not present

## 2019-08-13 DIAGNOSIS — T82590A Other mechanical complication of surgically created arteriovenous fistula, initial encounter: Secondary | ICD-10-CM | POA: Diagnosis not present

## 2019-08-13 DIAGNOSIS — Z7982 Long term (current) use of aspirin: Secondary | ICD-10-CM | POA: Diagnosis not present

## 2019-08-13 DIAGNOSIS — N2581 Secondary hyperparathyroidism of renal origin: Secondary | ICD-10-CM | POA: Diagnosis not present

## 2019-08-13 DIAGNOSIS — I447 Left bundle-branch block, unspecified: Secondary | ICD-10-CM | POA: Diagnosis not present

## 2019-08-13 DIAGNOSIS — N186 End stage renal disease: Secondary | ICD-10-CM

## 2019-08-13 DIAGNOSIS — T82510A Breakdown (mechanical) of surgically created arteriovenous fistula, initial encounter: Secondary | ICD-10-CM | POA: Diagnosis not present

## 2019-08-13 DIAGNOSIS — Z6833 Body mass index (BMI) 33.0-33.9, adult: Secondary | ICD-10-CM | POA: Diagnosis not present

## 2019-08-13 DIAGNOSIS — D649 Anemia, unspecified: Secondary | ICD-10-CM | POA: Diagnosis present

## 2019-08-13 DIAGNOSIS — T82590D Other mechanical complication of surgically created arteriovenous fistula, subsequent encounter: Secondary | ICD-10-CM | POA: Diagnosis not present

## 2019-08-13 DIAGNOSIS — R0602 Shortness of breath: Secondary | ICD-10-CM | POA: Diagnosis present

## 2019-08-13 DIAGNOSIS — T82868A Thrombosis of vascular prosthetic devices, implants and grafts, initial encounter: Secondary | ICD-10-CM | POA: Diagnosis not present

## 2019-08-13 DIAGNOSIS — E1122 Type 2 diabetes mellitus with diabetic chronic kidney disease: Secondary | ICD-10-CM | POA: Diagnosis present

## 2019-08-13 DIAGNOSIS — E1165 Type 2 diabetes mellitus with hyperglycemia: Secondary | ICD-10-CM | POA: Diagnosis present

## 2019-08-13 DIAGNOSIS — Y712 Prosthetic and other implants, materials and accessory cardiovascular devices associated with adverse incidents: Secondary | ICD-10-CM | POA: Diagnosis present

## 2019-08-13 DIAGNOSIS — E669 Obesity, unspecified: Secondary | ICD-10-CM | POA: Diagnosis present

## 2019-08-13 DIAGNOSIS — K219 Gastro-esophageal reflux disease without esophagitis: Secondary | ICD-10-CM | POA: Diagnosis present

## 2019-08-13 DIAGNOSIS — N250 Renal osteodystrophy: Secondary | ICD-10-CM | POA: Diagnosis present

## 2019-08-13 DIAGNOSIS — I5022 Chronic systolic (congestive) heart failure: Secondary | ICD-10-CM | POA: Diagnosis not present

## 2019-08-13 DIAGNOSIS — Z992 Dependence on renal dialysis: Secondary | ICD-10-CM | POA: Diagnosis not present

## 2019-08-13 DIAGNOSIS — Z789 Other specified health status: Secondary | ICD-10-CM | POA: Diagnosis present

## 2019-08-13 DIAGNOSIS — I428 Other cardiomyopathies: Secondary | ICD-10-CM | POA: Diagnosis not present

## 2019-08-13 DIAGNOSIS — Z20828 Contact with and (suspected) exposure to other viral communicable diseases: Secondary | ICD-10-CM | POA: Diagnosis not present

## 2019-08-13 DIAGNOSIS — I132 Hypertensive heart and chronic kidney disease with heart failure and with stage 5 chronic kidney disease, or end stage renal disease: Secondary | ICD-10-CM | POA: Diagnosis not present

## 2019-08-13 HISTORY — PX: INSERTION OF DIALYSIS CATHETER: SHX1324

## 2019-08-13 HISTORY — PX: THROMBECTOMY AND REVISION OF ARTERIOVENTOUS (AV) GORETEX  GRAFT: SHX6120

## 2019-08-13 LAB — RENAL FUNCTION PANEL
Albumin: 3.1 g/dL — ABNORMAL LOW (ref 3.5–5.0)
Anion gap: 14 (ref 5–15)
BUN: 69 mg/dL — ABNORMAL HIGH (ref 6–20)
CO2: 24 mmol/L (ref 22–32)
Calcium: 9.2 mg/dL (ref 8.9–10.3)
Chloride: 94 mmol/L — ABNORMAL LOW (ref 98–111)
Creatinine, Ser: 15.25 mg/dL — ABNORMAL HIGH (ref 0.61–1.24)
GFR calc Af Amer: 4 mL/min — ABNORMAL LOW (ref >60)
GFR calc non Af Amer: 3 mL/min — ABNORMAL LOW (ref >60)
Glucose, Bld: 344 mg/dL — ABNORMAL HIGH (ref 70–99)
Phosphorus: 3.7 mg/dL (ref 2.5–4.6)
Potassium: 5 mmol/L (ref 3.5–5.1)
Sodium: 132 mmol/L — ABNORMAL LOW (ref 135–145)

## 2019-08-13 LAB — CBG MONITORING, ED: Glucose-Capillary: 367 mg/dL — ABNORMAL HIGH (ref 70–99)

## 2019-08-13 LAB — GLUCOSE, CAPILLARY
Glucose-Capillary: 321 mg/dL — ABNORMAL HIGH (ref 70–99)
Glucose-Capillary: 251 mg/dL — ABNORMAL HIGH (ref 70–99)
Glucose-Capillary: 200 mg/dL — ABNORMAL HIGH (ref 70–99)
Glucose-Capillary: 179 mg/dL — ABNORMAL HIGH (ref 70–99)
Glucose-Capillary: 292 mg/dL — ABNORMAL HIGH (ref 70–99)

## 2019-08-13 LAB — SARS CORONAVIRUS 2 (TAT 6-24 HRS): SARS Coronavirus 2: NEGATIVE

## 2019-08-13 LAB — MRSA PCR SCREENING: MRSA by PCR: NEGATIVE

## 2019-08-13 SURGERY — THROMBECTOMY AND REVISION OF ARTERIOVENTOUS (AV) GORETEX  GRAFT
Anesthesia: Monitor Anesthesia Care | Laterality: Left

## 2019-08-13 MED ORDER — ACETAMINOPHEN 500 MG PO TABS: 1000.0000 mg | ORAL_TABLET | Freq: Once | ORAL | Status: DC | PRN

## 2019-08-13 MED ORDER — EPHEDRINE SULFATE-NACL 50-0.9 MG/10ML-% IV SOSY
PREFILLED_SYRINGE | INTRAVENOUS | Status: DC | PRN
  Administered 2019-08-13: 5 mg via INTRAVENOUS

## 2019-08-13 MED ORDER — MIDAZOLAM HCL 2 MG/2ML IJ SOLN
INTRAMUSCULAR | Status: AC
  Filled 2019-08-13: qty 2

## 2019-08-13 MED ORDER — LIDOCAINE-EPINEPHRINE 0.5 %-1:200000 IJ SOLN
INTRAMUSCULAR | Status: DC | PRN
  Administered 2019-08-13: 50 mL

## 2019-08-13 MED ORDER — 0.9 % SODIUM CHLORIDE (POUR BTL) OPTIME
TOPICAL | Status: DC | PRN
  Administered 2019-08-13: 1000 mL

## 2019-08-13 MED ORDER — PROPOFOL 10 MG/ML IV BOLUS
INTRAVENOUS | Status: DC | PRN
  Administered 2019-08-13 (×2): 10 mg via INTRAVENOUS

## 2019-08-13 MED ORDER — SODIUM CHLORIDE 0.9 % IV SOLN
INTRAVENOUS | Status: DC | PRN
  Administered 2019-08-13: 500 mL

## 2019-08-13 MED ORDER — HYDROCODONE-ACETAMINOPHEN 5-325 MG PO TABS
1.0000 | ORAL_TABLET | Freq: Four times a day (QID) | ORAL | Status: DC | PRN
  Administered 2019-08-13 – 2019-08-15 (×2): 2 via ORAL
  Filled 2019-08-13 (×3): qty 2

## 2019-08-13 MED ORDER — CHLORHEXIDINE GLUCONATE CLOTH 2 % EX PADS
6.0000 | MEDICATED_PAD | Freq: Every day | CUTANEOUS | Status: DC
  Administered 2019-08-13 – 2019-08-16 (×3): 6 via TOPICAL

## 2019-08-13 MED ORDER — SODIUM CHLORIDE 0.9 % IV SOLN
INTRAVENOUS | Status: AC
  Filled 2019-08-13: qty 1.2

## 2019-08-13 MED ORDER — OXYCODONE HCL 5 MG PO TABS: 5.0000 mg | ORAL_TABLET | Freq: Once | ORAL | Status: DC | PRN

## 2019-08-13 MED ORDER — CEFAZOLIN SODIUM-DEXTROSE 2-4 GM/100ML-% IV SOLN
INTRAVENOUS | Status: AC
  Filled 2019-08-13: qty 100

## 2019-08-13 MED ORDER — OXYCODONE HCL 5 MG/5ML PO SOLN: 5.0000 mg | Freq: Once | ORAL | Status: DC | PRN

## 2019-08-13 MED ORDER — ACETAMINOPHEN 160 MG/5ML PO SOLN: 1000.0000 mg | Freq: Once | ORAL | Status: DC | PRN

## 2019-08-13 MED ORDER — PROPOFOL 500 MG/50ML IV EMUL
INTRAVENOUS | Status: AC
  Filled 2019-08-13: qty 50

## 2019-08-13 MED ORDER — PROPOFOL 10 MG/ML IV BOLUS
INTRAVENOUS | Status: AC
  Filled 2019-08-13: qty 20

## 2019-08-13 MED ORDER — HEPARIN SODIUM (PORCINE) 1000 UNIT/ML IJ SOLN
INTRAMUSCULAR | Status: AC
  Filled 2019-08-13: qty 1

## 2019-08-13 MED ORDER — FENTANYL CITRATE (PF) 250 MCG/5ML IJ SOLN
INTRAMUSCULAR | Status: AC
  Filled 2019-08-13: qty 5

## 2019-08-13 MED ORDER — PROPOFOL 500 MG/50ML IV EMUL
INTRAVENOUS | Status: DC | PRN
  Administered 2019-08-13: 100 ug/kg/min via INTRAVENOUS

## 2019-08-13 MED ORDER — FENTANYL CITRATE (PF) 100 MCG/2ML IJ SOLN
INTRAMUSCULAR | Status: DC | PRN
  Administered 2019-08-13 (×4): 25 ug via INTRAVENOUS

## 2019-08-13 MED ORDER — PROPOFOL 1000 MG/100ML IV EMUL
INTRAVENOUS | Status: AC
  Filled 2019-08-13: qty 100

## 2019-08-13 MED ORDER — ACETAMINOPHEN 10 MG/ML IV SOLN: 1000.0000 mg | Freq: Once | INTRAVENOUS | Status: DC | PRN

## 2019-08-13 MED ORDER — FENTANYL CITRATE (PF) 100 MCG/2ML IJ SOLN: 25.0000 ug | INTRAMUSCULAR | Status: DC | PRN

## 2019-08-13 MED ORDER — EPHEDRINE 5 MG/ML INJ
INTRAVENOUS | Status: AC
  Filled 2019-08-13: qty 10

## 2019-08-13 MED ORDER — HEPARIN SODIUM (PORCINE) 1000 UNIT/ML IJ SOLN
INTRAMUSCULAR | Status: DC | PRN
  Administered 2019-08-13: 3800 [IU] via INTRAVENOUS

## 2019-08-13 MED ORDER — MIDAZOLAM HCL 5 MG/5ML IJ SOLN
INTRAMUSCULAR | Status: DC | PRN
  Administered 2019-08-13: 1 mg via INTRAVENOUS

## 2019-08-13 MED ORDER — SODIUM CHLORIDE 0.9 % IV SOLN
INTRAVENOUS | Status: DC | PRN
  Administered 2019-08-13: 10:00:00 25 ug/min via INTRAVENOUS

## 2019-08-13 MED ORDER — CEFAZOLIN SODIUM-DEXTROSE 2-4 GM/100ML-% IV SOLN: 2.0000 g | Freq: Once | INTRAVENOUS | Status: DC

## 2019-08-13 MED ORDER — SODIUM CHLORIDE 0.9 % IV SOLN
INTRAVENOUS | Status: DC
  Administered 2019-08-13 (×2): via INTRAVENOUS

## 2019-08-13 MED ORDER — LIDOCAINE-EPINEPHRINE 0.5 %-1:200000 IJ SOLN
INTRAMUSCULAR | Status: AC
  Filled 2019-08-13: qty 1

## 2019-08-13 MED ORDER — LIDOCAINE 2% (20 MG/ML) 5 ML SYRINGE
INTRAMUSCULAR | Status: DC | PRN
  Administered 2019-08-13: 100 mg via INTRAVENOUS

## 2019-08-13 MED ORDER — LIDOCAINE 2% (20 MG/ML) 5 ML SYRINGE
INTRAMUSCULAR | Status: AC
  Filled 2019-08-13: qty 5

## 2019-08-13 SURGICAL SUPPLY — 46 items
ARMBAND PINK RESTRICT EXTREMIT (MISCELLANEOUS) ×8 IMPLANT
BIOPATCH RED 1 DISK 7.0 (GAUZE/BANDAGES/DRESSINGS) ×3 IMPLANT
BIOPATCH RED 1IN DISK 7.0MM (GAUZE/BANDAGES/DRESSINGS) ×1
CANISTER SUCT 3000ML PPV (MISCELLANEOUS) ×4 IMPLANT
CANNULA VESSEL 3MM 2 BLNT TIP (CANNULA) ×4 IMPLANT
CATH BEACON 5.038 65CM KMP-01 (CATHETERS) ×4 IMPLANT
CATH EMB 4FR 80CM (CATHETERS) ×8 IMPLANT
CATH EMB 5FR 80CM (CATHETERS) ×4 IMPLANT
CATH PALINDROME RT-P 15FX28CM (CATHETERS) ×4 IMPLANT
CLIP LIGATING EXTRA MED SLVR (CLIP) ×4 IMPLANT
CLIP LIGATING EXTRA SM BLUE (MISCELLANEOUS) ×4 IMPLANT
COVER PROBE W GEL 5X96 (DRAPES) ×8 IMPLANT
COVER WAND RF STERILE (DRAPES) ×4 IMPLANT
DECANTER SPIKE VIAL GLASS SM (MISCELLANEOUS) ×4 IMPLANT
DERMABOND ADVANCED (GAUZE/BANDAGES/DRESSINGS) ×4
DERMABOND ADVANCED .7 DNX12 (GAUZE/BANDAGES/DRESSINGS) ×4 IMPLANT
DRAPE CHEST BREAST 15X10 FENES (DRAPES) ×4 IMPLANT
ELECT REM PT RETURN 9FT ADLT (ELECTROSURGICAL) ×4
ELECTRODE REM PT RTRN 9FT ADLT (ELECTROSURGICAL) ×2 IMPLANT
GLOVE BIO SURGEON STRL SZ 6.5 (GLOVE) ×3 IMPLANT
GLOVE BIO SURGEONS STRL SZ 6.5 (GLOVE) ×1
GLOVE BIOGEL PI IND STRL 7.0 (GLOVE) ×2 IMPLANT
GLOVE BIOGEL PI IND STRL 7.5 (GLOVE) ×2 IMPLANT
GLOVE BIOGEL PI INDICATOR 7.0 (GLOVE) ×2
GLOVE BIOGEL PI INDICATOR 7.5 (GLOVE) ×2
GLOVE SS BIOGEL STRL SZ 7.5 (GLOVE) ×2 IMPLANT
GLOVE SUPERSENSE BIOGEL SZ 7.5 (GLOVE) ×2
GOWN STRL REUS W/ TWL LRG LVL3 (GOWN DISPOSABLE) ×6 IMPLANT
GOWN STRL REUS W/TWL LRG LVL3 (GOWN DISPOSABLE) ×6
KIT BASIN OR (CUSTOM PROCEDURE TRAY) ×4 IMPLANT
KIT TURNOVER KIT B (KITS) ×4 IMPLANT
NS IRRIG 1000ML POUR BTL (IV SOLUTION) ×4 IMPLANT
PACK CV ACCESS (CUSTOM PROCEDURE TRAY) ×4 IMPLANT
PAD ARMBOARD 7.5X6 YLW CONV (MISCELLANEOUS) ×8 IMPLANT
SUT ETHILON 3 0 PS 1 (SUTURE) ×4 IMPLANT
SUT PROLENE 5 0 C 1 24 (SUTURE) ×8 IMPLANT
SUT PROLENE 6 0 CC (SUTURE) ×4 IMPLANT
SUT VIC AB 3-0 SH 27 (SUTURE) ×2
SUT VIC AB 3-0 SH 27X BRD (SUTURE) ×2 IMPLANT
SUT VIC AB 4-0 PS2 18 (SUTURE) ×4 IMPLANT
SYR 20ML LL LF (SYRINGE) ×4 IMPLANT
SYR 5ML LL (SYRINGE) ×4 IMPLANT
SYRINGE 3CC LL L/F (MISCELLANEOUS) ×4 IMPLANT
TOWEL GREEN STERILE (TOWEL DISPOSABLE) ×4 IMPLANT
UNDERPAD 30X30 (UNDERPADS AND DIAPERS) ×4 IMPLANT
WATER STERILE IRR 1000ML POUR (IV SOLUTION) ×4 IMPLANT

## 2019-08-13 NOTE — ED Notes (Signed)
ED TO INPATIENT HANDOFF REPORT  ED Nurse Name and Phone #: Annie Main [phone]  S Name/Age/Gender [name] [name]. 58 y.o. male Room/Bed: 041C/041C  Code Status   Code Status: Full Code  Home/SNF/Other Home Patient oriented to: self, place, time and situation Is this baseline? Yes   Triage Complete: Triage complete  Chief Complaint dialysis pt SOB  Triage Note Pt c.o sob due to not being able to receive dialysis today. States the staff stuck his fistula 7 times and could not get it to work. Last dialysis treatment Wednesday. Resp e.u at this time.   Allergies No Known Allergies  Level of Care/Admitting Diagnosis ED Disposition    ED Disposition Condition Comment   Admit  Hospital Area: [facility] [[id number]]  Level of Care: Telemetry Medical [104]  I expect the patient will be discharged within 24 hours: No (not a candidate for 5C-Observation unit)  Covid Evaluation: Asymptomatic Screening Protocol (No Symptoms)  Diagnosis: [id number]  Admitting Physician: Shela Leff [id number]  Attending Physician: Shela Leff [phone]  PT Class (Do Not Modify): Observation [104]  PT Acc Code (Do Not Modify): Observation [10022]       B Medical/Surgery History Past Medical History:  Diagnosis Date  . Diabetes mellitus without complication ([place])   . ESRD on hemodialysis ([place])   . Hypertension   . Kidney failure   . Nonischemic cardiomyopathy [facility])    Past Surgical History:  Procedure Laterality Date  . AV FISTULA PLACEMENT    . IRRIGATION AND DEBRIDEMENT ABSCESS    . TOE AMPUTATION       A IV Location/Drains/Wounds Patient Lines/Drains/Airways Status   Active Line/Drains/Airways    Name:   Placement date:   Placement time:   Site:   Days:   Peripheral IV 08/12/19 Right Antecubital   08/12/19    1852    Antecubital   1   Fistula / Graft Left Forearm Arteriovenous fistula   09/22/15    0114    Forearm   1421           Intake/Output Last 24 hours  Intake/Output Summary (Last 24 hours) at 08/13/2019 0101 Last data filed at 08/12/2019 1954 Gross per 24 hour  Intake 3 ml  Output -  Net 3 ml    Labs/Imaging Results for orders placed or performed during the hospital encounter of 08/12/19 (from the past 48 hour(s))  Basic metabolic panel     Status: Abnormal   Collection Time: 08/12/19  5:08 PM  Result Value Ref Range   Sodium 132 (L) 135 - 145 mmol/L   Potassium 4.8 3.5 - 5.1 mmol/L    Comment: HEMOLYSIS AT THIS LEVEL MAY AFFECT RESULT   Chloride 89 (L) 98 - 111 mmol/L   CO2 26 22 - 32 mmol/L   Glucose, Bld 262 (H) 70 - 99 mg/dL   BUN 59 (H) 6 - 20 mg/dL   Creatinine, Ser 14.13 (H) 0.61 - 1.24 mg/dL   Calcium 9.6 8.9 - 10.3 mg/dL   GFR calc non Af Amer 3 (L) >60 mL/min   GFR calc Af Amer 4 (L) >60 mL/min   Anion gap 17 (H) 5 - 15    Comment: Performed at [facility], [address] [place], [place] [address]  CBC     Status: Abnormal   Collection Time: 08/12/19  5:08 PM  Result Value Ref Range   WBC 7.9 4.0 - 10.5 K/uL  RBC 4.85 4.22 - 5.81 MIL/uL   Hemoglobin 15.5 13.0 - 17.0 g/dL   HCT 48.0 39.0 - 52.0 %   MCV 99.0 80.0 - 100.0 fL   MCH 32.0 26.0 - 34.0 pg   MCHC 32.3 30.0 - 36.0 g/dL   RDW 15.8 (H) 11.5 - 15.5 %   Platelets 180 150 - 400 K/uL   nRBC 0.0 0.0 - 0.2 %    Comment: Performed at [facility] [address] [place], [place] [id number]  Hemoglobin A1c     Status: Abnormal   Collection Time: 08/12/19  5:08 PM  Result Value Ref Range   Hgb A1c MFr Bld 9.9 (H) 4.8 - 5.6 %    Comment: (NOTE) Pre diabetes:          5.7%-6.4% Diabetes:              >6.4% Glycemic control for   <7.0% adults with diabetes    Mean Plasma Glucose 237.43 mg/dL    Comment: Performed at [facility] [address], [place], [place] [address]  Troponin I (High Sensitivity)     Status: Abnormal   Collection Time: 08/12/19  5:28 PM  Result Value Ref Range    Troponin I (High Sensitivity) 53 (H) <18 ng/L    Comment: (NOTE) Elevated high sensitivity troponin I (hsTnI) values and significant  changes across serial measurements may suggest ACS but many other  chronic and acute conditions are known to elevate hsTnI results.  Refer to the "Links" section for chest pain algorithms and additional  guidance. Performed at [facility], [place] [address], [place], [place] [address]   Troponin I (High Sensitivity)     Status: Abnormal   Collection Time: 08/12/19  7:28 PM  Result Value Ref Range   Troponin I (High Sensitivity) 49 (H) <18 ng/L    Comment: (NOTE) Elevated high sensitivity troponin I (hsTnI) values and significant  changes across serial measurements may suggest ACS but many other  chronic and acute conditions are known to elevate hsTnI results.  Refer to the "Links" section for chest pain algorithms and additional  guidance. Performed at [facility], [place] [address] [place], [place] [address]   CBG monitoring, ED     Status: Abnormal   Collection Time: 08/13/19 12:08 AM  Result Value Ref Range   Glucose-Capillary 367 (H) 70 - 99 mg/dL   Dg Chest 2 View  Result Date: 08/12/2019 CLINICAL DATA:  Shortness of breath EXAM: CHEST - 2 VIEW COMPARISON:  08/05/2019 FINDINGS: Heart and mediastinal contours are within normal limits. No focal opacities or effusions. No acute bony abnormality. IMPRESSION: No active cardiopulmonary disease. Electronically Signed   By: Rolm Baptise M.D.   On: 08/12/2019 17:32    Pending Labs Unresulted Labs (From admission, onward)    Start     Ordered   08/13/19 0500  Renal function panel  Tomorrow morning,   R     08/12/19 2217   08/12/19 2051  SARS CORONAVIRUS 2 (TAT 6-24 HRS) Nasopharyngeal Nasopharyngeal Swab  (Asymptomatic/Tier 2 Patients Labs)  Once,   STAT    Question Answer Comment  Is this test for diagnosis or screening Screening   Symptomatic for COVID-19 as defined by CDC No    Hospitalized for COVID-19 No   Admitted to ICU for COVID-19 No   Previously tested for COVID-19 No   Resident in a congregate (group) care setting Unknown   Employed  in healthcare setting Unknown      08/12/19 2052          Vitals/Pain Today's Vitals   08/12/19 2230 08/12/19 2300 08/12/19 2330 08/13/19 0000  BP: (!) 173/93 (!) 167/88 (!) 174/83 (!) 184/106  Pulse: 84  85   Resp: (!) 22 18 (!) 22 (!) 21  Temp:      TempSrc:      SpO2: 99%  96%   Weight:      Height:      PainSc:        Isolation Precautions No active isolations  Medications Medications  amLODipine (NORVASC) tablet 10 mg (has no administration in time range)  atorvastatin (LIPITOR) tablet 40 mg (40 mg Oral Given 08/12/19 2352)  carvedilol (COREG) tablet 25 mg (25 mg Oral Given 08/12/19 2352)  sacubitril-valsartan (ENTRESTO) 24-26 mg per tablet (1 tablet Oral Given 08/12/19 2353)  bisacodyl (DULCOLAX) EC tablet 5 mg (has no administration in time range)  famotidine (PEPCID) tablet 20 mg (20 mg Oral Given 08/12/19 2353)  pantoprazole (PROTONIX) EC tablet 20 mg (has no administration in time range)  sucroferric oxyhydroxide (VELPHORO) chewable tablet 1,000 mg (has no administration in time range)  lidocaine-prilocaine (EMLA) cream 1 application (has no administration in time range)  acetaminophen (TYLENOL) tablet 650 mg (has no administration in time range)    Or  acetaminophen (TYLENOL) suppository 650 mg (has no administration in time range)  insulin aspart (novoLOG) injection 0-9 Units (has no administration in time range)  insulin aspart (novoLOG) injection 0-5 Units (5 Units Subcutaneous Given 08/13/19 0015)  sodium chloride flush (NS) 0.9 % injection 3 mL (3 mLs Intravenous Given 08/12/19 1954)    Mobility walks with person assist Moderate Fall Risk  Focused Assessments Pulmonary Assessment Handoff:  Lung sounds: Bilateral Breath Sounds: Clear, Diminished O2 Device: Room Air         R Recommendations: See Admitting Provider Note  Report given to:   Additional Notes:

## 2019-08-13 NOTE — Transfer of Care (Signed)
Immediate Anesthesia Transfer of Care Note  Patient: [name] Cruel Sr.  Procedure(s) Performed: THROMBECTOMY OF LEFT ARM ARTERIOVENTOUS (AV) FISTULA (Left ) Insertion Of Dialysis Catheter  Patient Location: PACU  Anesthesia Type:MAC  Level of Consciousness: drowsy  Airway & Oxygen Therapy: Patient Spontanous Breathing and Patient connected to face mask oxygen  Post-op Assessment: Report given to RN and Post -op Vital signs reviewed and stable  Post vital signs: Reviewed and stable  Last Vitals:  Vitals Value Taken Time  BP 112/39 08/13/19 1226  Temp    Pulse 70 08/13/19 1229  Resp 23 08/13/19 1229  SpO2 97 % 08/13/19 1229  Vitals shown include unvalidated device data.  Last Pain:  Vitals:   08/13/19 0825  TempSrc: Oral  PainSc:          Complications: No apparent anesthesia complications

## 2019-08-13 NOTE — Op Note (Signed)
OPERATIVE REPORT  DATE OF SURGERY: 08/13/2019  PATIENT: [name] Cruel Sr., 58 y.o. male MRN: [id number]  DOB: July 23, 1961  PRE-OPERATIVE DIAGNOSIS: End-stage renal disease with occluded left upper arm AV fistula  POST-OPERATIVE DIAGNOSIS:  Same  PROCEDURE: #1 thrombectomy of left upper arm AV fistula, #2 left IJ tunneled hemodialysis catheter  SURGEON:  Curt Jews, M.D.  PHYSICIAN ASSISTANT: Nurse  ANESTHESIA: Local with sedation  EBL: per anesthesia record  Total I/O In: 500 [I.V.:500] Out: 75 [Blood:75]  BLOOD ADMINISTERED: none  DRAINS: none  SPECIMEN: none  COUNTS CORRECT:  YES  PATIENT DISPOSITION:  PACU - hemodynamically stable  PROCEDURE DETAILS: Patient has a left upper arm AV fistula.  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  He is taken the operating room for attempted thrombectomy.  I imaged the fistula with SonoSite ultrasound and the patient actually has a bare-metal stent in the upper cephalic vein above the aneurysmal change and below the shoulder.  Using local anesthesia incision was made over the fistula and carried down to isolate the vein at the distal area of the stent.  The vein was encircled with a vessel loop.  The vein was occluded proximally and distally and was opened transversely.  A 4 Fogarty catheter was passed through the stent and there was no evidence of stenosis in the stent.  Catheter could be passed to the level of the shoulder but would not pass centrally into the deep veins.  A great deal of thrombus was removed and there was backbleeding.  This was flushed with heparinized saline and reoccluded.  Next the proximal fistula was thrombectomized.  Patient had very extensive aneurysmal change in the fistula with a great deal of mural thrombus present.  This was all removed and there was excellent inflow.  This was flushed with heparinized saline and reoccluded.  The incision in the vein  was closed with a running 5-0 Prolene suture.  Clamps were removed.  The flow was still mainly pulsatile.  Is felt that no further options were available for salvage if this is not adequate.  Patient has extensive degeneration all over throughout his fistula and also probable stenosis or occlusion at the subclavian cephalic junction.  The wound was irrigated with saline hemostasis after cautery wounds were closed with 3-0 Vicryl in the subcutaneous subcuticular tissue  Attention was then turned to the right and left neck for catheter placement.  SonoSite ultrasound was used to visualize the jugular veins.  The patient had a very atretic vein on the right with a normal size on the left.  The right and left neck and chest were prepped and draped in usual sterile fashion.  Using SonoSite visualization and 18-gauge needle, the internal jugular vein was accessed and a guidewire was passed centrally.  Fluoroscopy was used for visualization.  The J-wire would not pass centrally.  For this reason a Kumpe catheter was passed over the guidewire and with manipulation with the catheter the guidewire was able to be passed down to the level of the inferior vena cava.  Dilator was passed over the guidewire and then a dilator and peel-away sheath was positioned over the guidewire.  The dilator was removed and a 28 cm tunnel catheter was positioned over the guidewire and through the peel-away sheath.  Peel-away sheath was removed as was the guidewire.  The catheter was brought through a subcutaneous tunnel through a separate stab incision and the 2 lm ports  were attached.  Both lumens flushed and aspirated easily and were locked with 1000/cc heparin.  The catheter was secured to the skin with a 3-0 nylon stitch and the entry site was closed with a 4-0 subcuticular Vicryl stitch.  Sterile dressing was applied and the patient was transferred to the recovery room with chest x-rays pending   [name], M.D., [facility] 08/13/2019  12:32 PM

## 2019-08-13 NOTE — Progress Notes (Signed)
Pt c/o pain at 8/10 from catheter and surgical site on left arm. Too soon for Tylenol. Messaged on call.   Eleanora Neighbor, RN

## 2019-08-13 NOTE — Consult Note (Signed)
Reason for Consult: ESRD Referring Physician:  Dr. Eliseo Squires  Chief Complaint:  Unable to access fistula  [facility] dialysis MWF 4hr 47min 180NRe EDW 113kg Prsabiv 2.57mmg TIW Calcitriol 1.25 mcg PO TIW Heparin 6000 U bolus  Assessment/Plan: 1. ESRD - will hd today after TC is placed. 2. Renal osteodystrophy - binders and check phos for management 3. Anemia - not on and doesn't need ESA 4. Thrombosed FIstula - unclear whether is salvageable but appreciate Dr. Luther Parody help. 5. HTN 6. DM    HPI: [name] KLEMM Sr. is an 58 y.o. male with medical history significant of ESRD on HD MWF, hypertension, nonischemic cardiomyopathy, type 2 diabetes presenting to the hospital for evaluation of left upper extremity AV fistula malfunction and shortness of breath.  Patient went for dialysis Fri and the unit attempted 7 times but his AV fistula was not working; he was unable to receive any dialysis.   States he is feeling short of breath because he has fluid on him. His last outpt treatment was on Wed and he left 0.6 kg above his EDW. Denies f/c/n/v/cp. He was seen in the ED by Dr. Donnetta Hutching who thought that a tunneled catheter may be necessary if he is not able to be dialyzed through the fistula.  ROS Pertinent items are noted in HPI.  Chemistry and CBC: Creatinine, Ser  Date/Time Value Ref Range Status  08/13/2019 04:29 AM 15.25 (H) 0.61 - 1.24 mg/dL Final  08/12/2019 05:08 PM 14.13 (H) 0.61 - 1.24 mg/dL Final  08/05/2019 06:48 PM 7.80 (H) 0.61 - 1.24 mg/dL Final  09/24/2018 05:12 AM 10.97 (H) 0.61 - 1.24 mg/dL Final  09/23/2018 04:58 AM 9.11 (H) 0.61 - 1.24 mg/dL Final  09/22/2018 04:53 AM 11.28 (H) 0.61 - 1.24 mg/dL Final  09/21/2018 04:28 AM 8.33 (H) 0.61 - 1.24 mg/dL Final  09/20/2018 07:02 PM 7.19 (H) 0.61 - 1.24 mg/dL Final   Recent Labs  Lab 08/12/19 1708 08/13/19 0429  NA 132* 132*  K 4.8 5.0  CL 89* 94*  CO2 26 24  GLUCOSE 262* 344*  BUN 59* 69*  CREATININE 14.13* 15.25*  CALCIUM 9.6 9.2   PHOS  --  3.7   Recent Labs  Lab 08/12/19 1708  WBC 7.9  HGB 15.5  HCT 48.0  MCV 99.0  PLT 180   Liver Function Tests: Recent Labs  Lab 08/13/19 0429  ALBUMIN 3.1*   No results for input(s): LIPASE, AMYLASE in the last 168 hours. No results for input(s): AMMONIA in the last 168 hours. Cardiac Enzymes: No results for input(s): CKTOTAL, CKMB, CKMBINDEX, TROPONINI in the last 168 hours. Iron Studies: No results for input(s): IRON, TIBC, TRANSFERRIN, FERRITIN in the last 72 hours. PT/INR: @LABRCNTIP (inr:5)  Xrays/Other Studies: ) Results for orders placed or performed during the hospital encounter of 08/12/19 (from the past 48 hour(s))  Basic metabolic panel     Status: Abnormal   Collection Time: 08/12/19  5:08 PM  Result Value Ref Range   Sodium 132 (L) 135 - 145 mmol/L   Potassium 4.8 3.5 - 5.1 mmol/L    Comment: HEMOLYSIS AT THIS LEVEL MAY AFFECT RESULT   Chloride 89 (L) 98 - 111 mmol/L   CO2 26 22 - 32 mmol/L   Glucose, Bld 262 (H) 70 - 99 mg/dL   BUN 59 (H) 6 - 20 mg/dL   Creatinine, Ser 14.13 (H) 0.61 - 1.24 mg/dL   Calcium 9.6 8.9 - 10.3 mg/dL   GFR calc non Af Wyvonnia Lora  3 (L) >60 mL/min   GFR calc Af Amer 4 (L) >60 mL/min   Anion gap 17 (H) 5 - 15    Comment: Performed at [facility] [address], [place], [place] [address]  CBC     Status: Abnormal   Collection Time: 08/12/19  5:08 PM  Result Value Ref Range   WBC 7.9 4.0 - 10.5 K/uL   RBC 4.85 4.22 - 5.81 MIL/uL   Hemoglobin 15.5 13.0 - 17.0 g/dL   HCT 48.0 39.0 - 52.0 %   MCV 99.0 80.0 - 100.0 fL   MCH 32.0 26.0 - 34.0 pg   MCHC 32.3 30.0 - 36.0 g/dL   RDW 15.8 (H) 11.5 - 15.5 %   Platelets 180 150 - 400 K/uL   nRBC 0.0 0.0 - 0.2 %    Comment: Performed at [facility], [place] [address] [place], [place] [address]  Hemoglobin A1c     Status: Abnormal   Collection Time: 08/12/19  5:08 PM  Result Value Ref Range   Hgb A1c MFr Bld 9.9 (H) 4.8 - 5.6 %    Comment: (NOTE) Pre diabetes:           5.7%-6.4% Diabetes:              >6.4% Glycemic control for   <7.0% adults with diabetes    Mean Plasma Glucose 237.43 mg/dL    Comment: Performed at [facility] [address], [place], [place] [address]  Troponin I (High Sensitivity)     Status: Abnormal   Collection Time: 08/12/19  5:28 PM  Result Value Ref Range   Troponin I (High Sensitivity) 53 (H) <18 ng/L    Comment: (NOTE) Elevated high sensitivity troponin I (hsTnI) values and significant  changes across serial measurements may suggest ACS but many other  chronic and acute conditions are known to elevate hsTnI results.  Refer to the "Links" section for chest pain algorithms and additional  guidance. Performed at [facility], [place] [address] [place], [place] [address]   Troponin I (High Sensitivity)     Status: Abnormal   Collection Time: 08/12/19  7:28 PM  Result Value Ref Range   Troponin I (High Sensitivity) 49 (H) <18 ng/L    Comment: (NOTE) Elevated high sensitivity troponin I (hsTnI) values and significant  changes across serial measurements may suggest ACS but many other  chronic and acute conditions are known to elevate hsTnI results.  Refer to the "Links" section for chest pain algorithms and additional  guidance. Performed at [facility], [place] [address] [place], [place] [address]   SARS CORONAVIRUS 2 (TAT 6-24 HRS) Nasopharyngeal Nasopharyngeal Swab     Status: None   Collection Time: 08/12/19  8:51 PM   Specimen: Nasopharyngeal Swab  Result Value Ref Range   SARS Coronavirus 2 NEGATIVE NEGATIVE    Comment: (NOTE) SARS-CoV-2 target nucleic acids are NOT DETECTED. The SARS-CoV-2 RNA is generally detectable in upper and lower respiratory specimens during the acute phase of infection. Negative results do not preclude SARS-CoV-2 infection, do not rule out co-infections with other pathogens, and should not be used as the sole basis for treatment or other patient management  decisions. Negative results must be combined with clinical observations, patient history, and epidemiological information. The expected result is Negative. Fact Sheet for Patients: SugarRoll.be Fact Sheet for Healthcare Providers: https://www.woods-mathews.com/ This test is not yet approved or cleared by the [place]  FDA and  has been authorized for detection and/or diagnosis of SARS-CoV-2 by FDA under an Emergency Use Authorization (EUA). This EUA will remain  in effect (meaning this test can be used) for the duration of the COVID-19 declaration under Section 56 4(b)(1) of the Act, 21 U.S.C. section 360bbb-3(b)(1), unless the authorization is terminated or revoked sooner. Performed at [facility], [place] [address], [place], [place] [address]   CBG monitoring, ED     Status: Abnormal   Collection Time: 08/13/19 12:08 AM  Result Value Ref Range   Glucose-Capillary 367 (H) 70 - 99 mg/dL  MRSA PCR Screening     Status: None   Collection Time: 08/13/19  3:00 AM   Specimen: Nasal Mucosa; Nasopharyngeal  Result Value Ref Range   MRSA by PCR NEGATIVE NEGATIVE    Comment:        The GeneXpert MRSA Assay (FDA approved for NASAL specimens only), is one component of a comprehensive MRSA colonization surveillance program. It is not intended to diagnose MRSA infection nor to guide or monitor treatment for MRSA infections. Performed at [facility], [place] [address] [place], [place] [address]   Renal function panel     Status: Abnormal   Collection Time: 08/13/19  4:29 AM  Result Value Ref Range   Sodium 132 (L) 135 - 145 mmol/L   Potassium 5.0 3.5 - 5.1 mmol/L   Chloride 94 (L) 98 - 111 mmol/L   CO2 24 22 - 32 mmol/L   Glucose, Bld 344 (H) 70 - 99 mg/dL   BUN 69 (H) 6 - 20 mg/dL   Creatinine, Ser 15.25 (H) 0.61 - 1.24 mg/dL   Calcium 9.2 8.9 - 10.3 mg/dL   Phosphorus 3.7 2.5 - 4.6 mg/dL   Albumin 3.1 (L) 3.5 - 5.0 g/dL    GFR calc non Af Amer 3 (L) >60 mL/min   GFR calc Af Amer 4 (L) >60 mL/min   Anion gap 14 5 - 15    Comment: Performed at [facility] [address] [place], [place] [address]  Glucose, capillary     Status: Abnormal   Collection Time: 08/13/19  7:22 AM  Result Value Ref Range   Glucose-Capillary 321 (H) 70 - 99 mg/dL   Dg Chest 2 View  Result Date: 08/12/2019 CLINICAL DATA:  Shortness of breath EXAM: CHEST - 2 VIEW COMPARISON:  08/05/2019 FINDINGS: Heart and mediastinal contours are within normal limits. No focal opacities or effusions. No acute bony abnormality. IMPRESSION: No active cardiopulmonary disease. Electronically Signed   By: Rolm Baptise M.D.   On: 08/12/2019 17:32    PMH:   Past Medical History:  Diagnosis Date  . Diabetes mellitus without complication ([place])   . ESRD on hemodialysis ([place])   . Hypertension   . Kidney failure   . Nonischemic cardiomyopathy (HCC)     PSH:   Past Surgical History:  Procedure Laterality Date  . AV FISTULA PLACEMENT    . IRRIGATION AND DEBRIDEMENT ABSCESS    . TOE AMPUTATION      Allergies: Not on File  Medications:   Prior to Admission medications   Medication Sig Start Date End Date Taking? Authorizing Provider  amLODipine (NORVASC) 10 MG tablet Take 10 mg by mouth daily.   Yes [provider]  aspirin 81 MG chewable tablet Chew 81 mg by mouth daily. 09/01/18  Yes [provider]  atorvastatin (LIPITOR) 40 MG tablet Take 40 mg by  mouth every evening.  09/01/18  Yes [provider]  bisacodyl (DULCOLAX) 5 MG EC tablet Take 5 mg by mouth at bedtime as needed for mild constipation or moderate constipation.   Yes [provider]  carvedilol (COREG) 25 MG tablet Take 25 mg by mouth 2 (two) times daily. 08/31/18  Yes [provider]  ENTRESTO 24-26 MG Take 1 tablet by mouth 2 (two) times daily. 07/29/18  Yes [provider]  famotidine (PEPCID) 20 MG tablet Take 20 mg by mouth  2 (two) times daily.   Yes [provider]  HUMULIN 70/30 (70-30) 100 UNIT/ML injection Inject 6-20 Units into the skin See admin instructions. Inject 20 units into the skin in the morning and 6 units in the evening 08/31/18  Yes [provider]  lidocaine-prilocaine (EMLA) cream Apply 1 application topically every Monday, Wednesday, and Friday with hemodialysis.  08/17/18  Yes [provider]  omeprazole (PRILOSEC) 20 MG capsule Take 20 mg by mouth daily before breakfast.   Yes [provider]  sucroferric oxyhydroxide (VELPHORO) 500 MG chewable tablet Chew 1,000 mg by mouth 3 (three) times daily with meals.   Yes [provider]  sildenafil (VIAGRA) 100 MG tablet Take 100 mg by mouth daily as needed for erectile dysfunction.  09/02/18   [provider]    Discontinued Meds:   Medications Discontinued During This Encounter  Medication Reason  . amLODipine (NORVASC) 5 MG tablet Entry Error  . sodium bicarbonate 650 MG tablet Discontinued by provider  . pantoprazole (PROTONIX) 20 MG tablet Change in therapy  . RENVELA 2.4 g PACK Discontinued by provider  . lidocaine-prilocaine (EMLA) cream 1 application Inpatient Standard    Social History:  reports that he has never smoked. He has never used smokeless tobacco. He reports previous alcohol use. He reports that he does not use drugs.  Family History:  History reviewed. No pertinent family history.  Blood pressure 137/75, pulse 73, temperature 98.2 F (36.8 C), resp. rate 19, height 6\' 1"  (1.854 m), weight 115.7 kg, SpO2 96 %. General appearance: alert, cooperative and appears stated age Head: Normocephalic, without obvious abnormality, atraumatic Eyes: negative Neck: no adenopathy, no carotid bruit, supple, symmetrical, trachea midline and thyroid not enlarged, symmetric, no tenderness/mass/nodules Back: symmetric, no curvature. ROM normal. No CVA tenderness. Resp: clear to auscultation  bilaterally Chest wall: no tenderness Cardio: S1, S2 normal GI: soft, non-tender; bowel sounds normal; no masses,  no organomegaly Extremities: extremities normal, atraumatic, no cyanosis or edema Pulses: 2+ and symmetric       Tajah Noguchi, Hunt Oris, MD 08/13/2019, 8:19 AM

## 2019-08-13 NOTE — Progress Notes (Signed)
Progress Note    [name] BRUENING Sr.  [id number] DOB: 06/10/1961  DOA: 08/12/2019 PCP: Patient, No Pcp Per    Brief Narrative:     Medical records reviewed and are as summarized below:  [name] Cruel Sr. is an 58 y.o. male with medical history significant of ESRD on HD MWF, hypertension, nonischemic cardiomyopathy, type 2 diabetes presenting to the hospital for evaluation of left upper extremity AV fistula malfunction and shortness of breath.  Patient states he went for dialysis today and he started him 7 times but his AV fistula was not working.  They were not able to give him dialysis.  States he is feeling short of breath because he has fluid on him and wants dialysis.  Reports having a chronic cough, no recent change.  Denies chest pain.  Denies fevers or chills.  No other complaints.  Assessment/Plan:   Principal Problem:   Dialysis AV fistula malfunction (HCC) Active Problems:   ESRD (end stage renal disease) (HCC)   DM2 (diabetes mellitus, type 2) (HCC)   HTN (hypertension)   LBBB (left bundle branch block)   Problem with vascular access   Left upper arm AV fistula malfunction ESRD on HD MWF Patient was unable to get dialysis on Friday due to AV fistula malfunction +SOB  -s/p #1 thrombectomy of left upper arm AV fistula, #2 left IJ tunneled hemodialysis catheter -for HD today-- may need another tomorrow defer to renal  Left bundle branch block EKG with left bundle branch block and cardiology was consulted in the ED.  High-sensitivity troponin 53> 49.  ACS ruled out and cardiology is not recommending ischemic work-up. -Continue home Coreg, Delene Loll -has cardiology follow up at [facility]  Hypertension -Continue home amlodipine, Coreg, Entresto  Insulin-dependent diabetes mellitus -SSI  GERD -Continue home PPI and H2 blocker  obesity Body mass index is 33.64 kg/m.   Family Communication/Anticipated D/C date and plan/Code Status   DVT prophylaxis: scd  Code Status: Full Code.  Family Communication:  Disposition Plan:    Medical Consultants:    Cards- signed off  Renal  vascular   Subjective:   Very sleepy back from procedure  Objective:    Vitals:   08/13/19 0825 08/13/19 1230 08/13/19 1245 08/13/19 1307  BP: (!) 162/98 (!) 157/64 (!) 135/50 (!) 156/85  Pulse: 82 69 72 72  Resp: 16 20 17 18   Temp: 98.6 F (37 C) (!) 97 F (36.1 C)  97.9 F (36.6 C)  TempSrc: Oral   Oral  SpO2: 98% 97% 98% 94%  Weight:      Height:        Intake/Output Summary (Last 24 hours) at 08/13/2019 1425 Last data filed at 08/13/2019 1225 Gross per 24 hour  Intake 503 ml  Output 75 ml  Net 428 ml   Filed Weights   08/12/19 1915  Weight: 115.7 kg    Exam: Very sleepy rrr diminished breath sounds due to body habitus  Data Reviewed:   I have personally reviewed following labs and imaging studies:  Labs: Labs show the following:   Basic Metabolic Panel: Recent Labs  Lab 08/12/19 1708 08/13/19 0429  NA 132* 132*  K 4.8 5.0  CL 89* 94*  CO2 26 24  GLUCOSE 262* 344*  BUN 59* 69*  CREATININE 14.13* 15.25*  CALCIUM 9.6 9.2  PHOS  --  3.7   GFR Estimated Creatinine Clearance: 7.2 mL/min (A) (by C-G formula based on SCr of 15.25 mg/dL (H)).  Liver Function Tests: Recent Labs  Lab 08/13/19 0429  ALBUMIN 3.1*   No results for input(s): LIPASE, AMYLASE in the last 168 hours. No results for input(s): AMMONIA in the last 168 hours. Coagulation profile No results for input(s): INR, PROTIME in the last 168 hours.  CBC: Recent Labs  Lab 08/12/19 1708  WBC 7.9  HGB 15.5  HCT 48.0  MCV 99.0  PLT 180   Cardiac Enzymes: No results for input(s): CKTOTAL, CKMB, CKMBINDEX, TROPONINI in the last 168 hours. BNP (last 3 results) No results for input(s): PROBNP in the last 8760 hours. CBG: Recent Labs  Lab 08/13/19 0008 08/13/19 0722 08/13/19 0949 08/13/19 1305  GLUCAP 367* 321* 251* 200*   D-Dimer: No results  for input(s): DDIMER in the last 72 hours. Hgb A1c: Recent Labs    08/12/19 1708  HGBA1C 9.9*   Lipid Profile: No results for input(s): CHOL, HDL, LDLCALC, TRIG, CHOLHDL, LDLDIRECT in the last 72 hours. Thyroid function studies: No results for input(s): TSH, T4TOTAL, T3FREE, THYROIDAB in the last 72 hours.  Invalid input(s): FREET3 Anemia work up: No results for input(s): VITAMINB12, FOLATE, FERRITIN, TIBC, IRON, RETICCTPCT in the last 72 hours. Sepsis Labs: Recent Labs  Lab 08/12/19 1708  WBC 7.9    Microbiology Recent Results (from the past 240 hour(s))  SARS CORONAVIRUS 2 (TAT 6-24 HRS) Nasopharyngeal Nasopharyngeal Swab     Status: None   Collection Time: 08/12/19  8:51 PM   Specimen: Nasopharyngeal Swab  Result Value Ref Range Status   SARS Coronavirus 2 NEGATIVE NEGATIVE Final    Comment: (NOTE) SARS-CoV-2 target nucleic acids are NOT DETECTED. The SARS-CoV-2 RNA is generally detectable in upper and lower respiratory specimens during the acute phase of infection. Negative results do not preclude SARS-CoV-2 infection, do not rule out co-infections with other pathogens, and should not be used as the sole basis for treatment or other patient management decisions. Negative results must be combined with clinical observations, patient history, and epidemiological information. The expected result is Negative. Fact Sheet for Patients: SugarRoll.be Fact Sheet for Healthcare Providers: https://www.woods-mathews.com/ This test is not yet approved or cleared by the [place] FDA and  has been authorized for detection and/or diagnosis of SARS-CoV-2 by FDA under an Emergency Use Authorization (EUA). This EUA will remain  in effect (meaning this test can be used) for the duration of the COVID-19 declaration under Section 56 4(b)(1) of the Act, 21 U.S.C. section 360bbb-3(b)(1), unless the authorization is terminated or revoked  sooner. Performed at [facility], [place] [address] [place], [place] [address]   MRSA PCR Screening     Status: None   Collection Time: 08/13/19  3:00 AM   Specimen: Nasal Mucosa; Nasopharyngeal  Result Value Ref Range Status   MRSA by PCR NEGATIVE NEGATIVE Final    Comment:        The GeneXpert MRSA Assay (FDA approved for NASAL specimens only), is one component of a comprehensive MRSA colonization surveillance program. It is not intended to diagnose MRSA infection nor to guide or monitor treatment for MRSA infections. Performed at [facility], [place] [address] [name], [place] [address]     Procedures and diagnostic studies:  Dg Chest 1 View  Result Date: 08/13/2019 CLINICAL DATA:  Central line placement EXAM: CHEST  1 VIEW COMPARISON:  August 12, 2019 FINDINGS: The double lumen central line terminates just in the right side of the atrium, approximately 3 cm below the caval atrial junction.  No pneumothorax. Cardiomediastinal silhouette is stable. The lungs remain clear. IMPRESSION: The new left central line terminates in the right side of the atrium 3 cm below the caval atrial junction. No pneumothorax. No other change. Electronically Signed   By: Dorise Bullion III M.D   On: 08/13/2019 13:59   Dg Chest 2 View  Result Date: 08/12/2019 CLINICAL DATA:  Shortness of breath EXAM: CHEST - 2 VIEW COMPARISON:  08/05/2019 FINDINGS: Heart and mediastinal contours are within normal limits. No focal opacities or effusions. No acute bony abnormality. IMPRESSION: No active cardiopulmonary disease. Electronically Signed   By: Rolm Baptise M.D.   On: 08/12/2019 17:32   Dg C-arm 1-60 Min-no Report  Result Date: 08/13/2019 Fluoroscopy was utilized by the requesting physician.  No radiographic interpretation.    Medications:   . amLODipine  10 mg Oral Daily  . atorvastatin  40 mg Oral QPM  . carvedilol  25 mg Oral BID  . Chlorhexidine Gluconate Cloth  6 each Topical Q0600   . famotidine  20 mg Oral QHS  . insulin aspart  0-5 Units Subcutaneous QHS  . insulin aspart  0-9 Units Subcutaneous TID WC  . pantoprazole  20 mg Oral Daily  . sacubitril-valsartan  1 tablet Oral BID  . sucroferric oxyhydroxide  1,000 mg Oral TID WC   Continuous Infusions: . sodium chloride 10 mL/hr at 08/13/19 0959  . ceFAZolin       LOS: 0 days   Geradine Girt  [facility]   How to contact the [facility] Attending or Consulting provider [place] or covering provider during after hours [place], for this patient?  1. Check the care team in [facility] and look for a) attending/consulting [facility] provider listed and b) the [facility] team listed 2. Log into [email or web address] and use [facility]'s universal password to access. If you do not have the password, please contact the hospital operator. 3. Locate the [facility] provider you are looking for under [facility] and page to a number that you can be directly reached. 4. If you still have difficulty reaching the provider, please page the [facility] (Director on Call) for the Hospitalists listed on amion for assistance.  08/13/2019, 2:25 PM

## 2019-08-13 NOTE — Anesthesia Preprocedure Evaluation (Addendum)
Anesthesia Evaluation  Patient identified by MRN, date of birth, ID band Patient awake    Reviewed: Allergy & Precautions, NPO status , Patient's Chart, lab work & pertinent test results  History of Anesthesia Complications Negative for: history of anesthetic complications  Airway Mallampati: I  TM Distance: >3 FB Neck ROM: Full    Dental  (+) Dental Advisory Given   Pulmonary neg recent URI,    breath sounds clear to auscultation       Cardiovascular hypertension, + dysrhythmias  Rhythm:Regular     Neuro/Psych negative neurological ROS  negative psych ROS   GI/Hepatic   Endo/Other  diabetes  Renal/GU ESRF and DialysisRenal diseaseAV fistula malfunction     Musculoskeletal   Abdominal   Peds  Hematology   Anesthesia Other Findings 1. Nonischemic CM-via cardiac catheterization, LVEF-35% most likely etiology hypertension. NYHA class II symptoms, no HF exacerbations no HF hospitalizations. His volume is managed by dialysis, he is euvolemic on my exam. -continue entresto at current dose 24/26 mg bid -continue coreg 25 mg bid -continue ASA and atorvastatin 40 mg daily -CMP and lipids today   Reproductive/Obstetrics                           Anesthesia Physical Anesthesia Plan  ASA: III  Anesthesia Plan: MAC   Post-op Pain Management:    Induction: Intravenous  PONV Risk Score and Plan: 1 and Treatment may vary due to age or medical condition and Propofol infusion  Airway Management Planned: Nasal Cannula  Additional Equipment: None  Intra-op Plan:   Post-operative Plan:   Informed Consent: I have reviewed the patients History and Physical, chart, labs and discussed the procedure including the risks, benefits and alternatives for the proposed anesthesia with the patient or authorized representative who has indicated his/her understanding and acceptance.     Dental advisory  given  Plan Discussed with: CRNA and Surgeon  Anesthesia Plan Comments:        Anesthesia Quick Evaluation

## 2019-08-13 NOTE — Anesthesia Postprocedure Evaluation (Signed)
Anesthesia Post Note  Patient: [name] MELHORN Sr.  Procedure(s) Performed: THROMBECTOMY OF LEFT ARM ARTERIOVENTOUS (AV) FISTULA (Left ) Insertion Of Dialysis Catheter     Patient location during evaluation: PACU Anesthesia Type: MAC Level of consciousness: awake and alert Pain management: pain level controlled Vital Signs Assessment: post-procedure vital signs reviewed and stable Respiratory status: spontaneous breathing, nonlabored ventilation, respiratory function stable and patient connected to nasal cannula oxygen Cardiovascular status: stable and blood pressure returned to baseline Postop Assessment: no apparent nausea or vomiting Anesthetic complications: no    Last Vitals:  Vitals:   08/13/19 1307 08/13/19 1624  BP: (!) 156/85 (!) 165/90  Pulse: 72 78  Resp: 18 18  Temp: 36.6 C 37 C  SpO2: 94% 98%    Last Pain:  Vitals:   08/13/19 1700  TempSrc:   PainSc: Asleep                 Keshawn Fiorito

## 2019-08-13 NOTE — Progress Notes (Signed)
[name] Mabe, RN called and notified that pt's HD tx has been moved to 08/14/2019.  

## 2019-08-13 NOTE — Anesthesia Procedure Notes (Signed)
Procedure Name: MAC Date/Time: 08/13/2019 10:19 AM Performed by: Kyung Rudd, CRNA Pre-anesthesia Checklist: Patient identified, Emergency Drugs available, Suction available and Patient being monitored Patient Re-evaluated:Patient Re-evaluated prior to induction Oxygen Delivery Method: Simple face mask Induction Type: IV induction Placement Confirmation: positive ETCO2 Dental Injury: Teeth and Oropharynx as per pre-operative assessment

## 2019-08-13 NOTE — Progress Notes (Signed)
New Admission Note:   Arrival Method:  stretcher Mental Orientation: alert x four Telemetry: box 13 NSR Assessment: WNL Skin: intact IV: right AC Pain: no pain Tubes: no tubes Safety Measures: Safety Fall Prevention Plan has been discussed Admission: Completed [facility] Orientation: Patient has been orientated to the room, unit and staff.  Family: none at bedside Orders have been reviewed and implemented. Will continue to monitor the patient. Call light has been placed within reach and bed alarm has been activated.   Rockie Neighbours BSN, RN Phone number: [phone]

## 2019-08-13 NOTE — Progress Notes (Signed)
Patient ID: [name] Cruel Sr., male   DOB: [birth year], 58 y.o.   MRN: [id number] No longer has bruit present and left upper arm AV fistula.  Continues to have a pulse in the venous segment.  Discussed with patient.  Recommend exploration of his fistula today to determine if thrombectomy and revision is possible.  If not we will place tunneled hemodialysis catheter.  Patient has been n.p.o. and understands the plan

## 2019-08-14 ENCOUNTER — Encounter (HOSPITAL_COMMUNITY): Payer: Self-pay | Admitting: Vascular Surgery

## 2019-08-14 DIAGNOSIS — T82590D Other mechanical complication of surgically created arteriovenous fistula, subsequent encounter: Secondary | ICD-10-CM

## 2019-08-14 DIAGNOSIS — I5023 Acute on chronic systolic (congestive) heart failure: Secondary | ICD-10-CM

## 2019-08-14 DIAGNOSIS — I447 Left bundle-branch block, unspecified: Secondary | ICD-10-CM

## 2019-08-14 DIAGNOSIS — I5022 Chronic systolic (congestive) heart failure: Secondary | ICD-10-CM

## 2019-08-14 LAB — RENAL FUNCTION PANEL
Albumin: 3.1 g/dL — ABNORMAL LOW (ref 3.5–5.0)
Anion gap: 16 — ABNORMAL HIGH (ref 5–15)
BUN: 91 mg/dL — ABNORMAL HIGH (ref 6–20)
CO2: 23 mmol/L (ref 22–32)
Calcium: 9.3 mg/dL (ref 8.9–10.3)
Chloride: 94 mmol/L — ABNORMAL LOW (ref 98–111)
Creatinine, Ser: 18.16 mg/dL — ABNORMAL HIGH (ref 0.61–1.24)
GFR calc Af Amer: 3 mL/min — ABNORMAL LOW (ref >60)
GFR calc non Af Amer: 3 mL/min — ABNORMAL LOW (ref >60)
Glucose, Bld: 123 mg/dL — ABNORMAL HIGH (ref 70–99)
Phosphorus: 4 mg/dL (ref 2.5–4.6)
Potassium: 5.1 mmol/L (ref 3.5–5.1)
Sodium: 133 mmol/L — ABNORMAL LOW (ref 135–145)

## 2019-08-14 LAB — GLUCOSE, CAPILLARY
Glucose-Capillary: 425 mg/dL — ABNORMAL HIGH (ref 70–99)
Glucose-Capillary: 264 mg/dL — ABNORMAL HIGH (ref 70–99)
Glucose-Capillary: 137 mg/dL — ABNORMAL HIGH (ref 70–99)

## 2019-08-14 LAB — CBC
HCT: 40.8 % (ref 39.0–52.0)
Hemoglobin: 13.4 g/dL (ref 13.0–17.0)
MCH: 32 pg (ref 26.0–34.0)
MCHC: 32.8 g/dL (ref 30.0–36.0)
MCV: 97.4 fL (ref 80.0–100.0)
Platelets: 162 10*3/uL (ref 150–400)
RBC: 4.19 MIL/uL — ABNORMAL LOW (ref 4.22–5.81)
RDW: 15.6 % — ABNORMAL HIGH (ref 11.5–15.5)
WBC: 7.5 10*3/uL (ref 4.0–10.5)
nRBC: 0 % (ref 0.0–0.2)

## 2019-08-14 MED ORDER — INSULIN ASPART PROT & ASPART (70-30 MIX) 100 UNIT/ML ~~LOC~~ SUSP
15.0000 [IU] | Freq: Two times a day (BID) | SUBCUTANEOUS | Status: DC
  Administered 2019-08-14 – 2019-08-16 (×4): 15 [IU] via SUBCUTANEOUS
  Filled 2019-08-14: qty 10

## 2019-08-14 NOTE — Progress Notes (Signed)
Pt going to HD at this time.   Eleanora Neighbor, RN

## 2019-08-14 NOTE — Progress Notes (Addendum)
[facility] Progress Note   Subjective:   Patient seen in room. Tired today but otherwise no new concerns. [place] placed 9/26, planned for HD today (overflow from yesterday due to high patient load). Denies SOB, cough, CP, palpitations, abdominal pain, N/V/D. Reports he feels his EDW may be incorrect and they are not removing enough fluid. O2 sat 96% on RA.   Objective Vitals:   08/13/19 2033 08/14/19 0300 08/14/19 0433 08/14/19 0855  BP: (!) 175/91  (!) 152/82 (!) 145/77  Pulse: 80  77 72  Resp: 12  16 18   Temp: 99.3 F (37.4 C)  98.1 F (36.7 C) 98.5 F (36.9 C)  TempSrc: Oral  Oral Oral  SpO2: 99%  96% 96%  Weight:  119 kg    Height:       Physical Exam General: Well developed, alert male in NAD Heart: RRR, no murmurs rubs or fallops Lungs: CTA bilaterally without wheezing, rhonchi or rales Abdomen: Soft, non-tender, non-distended. +BS Extremities: + edema LUE. No edema b/l lower extremities Dialysis Access:  New L IJ TDC without surrounding erythema/visible drainage. LUE AVF + bruit, pulsatile  Additional Objective Labs: Basic Metabolic Panel: Recent Labs  Lab 08/12/19 1708 08/13/19 0429  NA 132* 132*  K 4.8 5.0  CL 89* 94*  CO2 26 24  GLUCOSE 262* 344*  BUN 59* 69*  CREATININE 14.13* 15.25*  CALCIUM 9.6 9.2  PHOS  --  3.7   Liver Function Tests: Recent Labs  Lab 08/13/19 0429  ALBUMIN 3.1*   CBC: Recent Labs  Lab 08/12/19 1708  WBC 7.9  HGB 15.5  HCT 48.0  MCV 99.0  PLT 180   Blood Culture    Component Value Date/Time   SDES BLOOD DRAWN BY DIALYSIS 09/22/2018 1159   SPECREQUEST  09/22/2018 1159    BOTTLES DRAWN AEROBIC AND ANAEROBIC Blood Culture adequate volume   CULT  09/22/2018 1159    NO GROWTH 7 DAYS Performed at [facility], [address], [place], [place] [address]    REPTSTATUS 09/29/2018 FINAL 09/22/2018 1159    CBG: Recent Labs  Lab 08/13/19 1305 08/13/19 1624 08/13/19 2030 08/14/19 0654 08/14/19 1111   GLUCAP 200* 179* 292* 425* 264*    Studies/Results: Dg Chest 1 View  Result Date: 08/13/2019 CLINICAL DATA:  Central line placement EXAM: CHEST  1 VIEW COMPARISON:  August 12, 2019 FINDINGS: The double lumen central line terminates just in the right side of the atrium, approximately 3 cm below the caval atrial junction. No pneumothorax. Cardiomediastinal silhouette is stable. The lungs remain clear. IMPRESSION: The new left central line terminates in the right side of the atrium 3 cm below the caval atrial junction. No pneumothorax. No other change. Electronically Signed   By: Dorise Bullion III M.D   On: 08/13/2019 13:59   Dg Chest 2 View  Result Date: 08/12/2019 CLINICAL DATA:  Shortness of breath EXAM: CHEST - 2 VIEW COMPARISON:  08/05/2019 FINDINGS: Heart and mediastinal contours are within normal limits. No focal opacities or effusions. No acute bony abnormality. IMPRESSION: No active cardiopulmonary disease. Electronically Signed   By: Rolm Baptise M.D.   On: 08/12/2019 17:32   Dg C-arm 1-60 Min-no Report  Result Date: 08/13/2019 Fluoroscopy was utilized by the requesting physician.  No radiographic interpretation.   Medications: . sodium chloride 10 mL/hr at 08/13/19 0959   . amLODipine  10 mg Oral Daily  . atorvastatin  40 mg Oral QPM  . carvedilol  25 mg  Oral BID  . Chlorhexidine Gluconate Cloth  6 each Topical Q0600  . famotidine  20 mg Oral QHS  . insulin aspart  0-5 Units Subcutaneous QHS  . insulin aspart  0-9 Units Subcutaneous TID WC  . insulin aspart protamine- aspart  15 Units Subcutaneous BID WC  . pantoprazole  20 mg Oral Daily  . sacubitril-valsartan  1 tablet Oral BID  . sucroferric oxyhydroxide  1,000 mg Oral TID WC    Dialysis Orders: [facility] dialysis MWF 4hr 41min 180NRe EDW 113kg Prsabiv 2.25mmg TIW Calcitriol 1.25 mcg PO TIW Heparin 6000 U bolus   Assessment/Plan: 1. Thrombosed fistula: Unable to dialyze on 08/12/2019. Thrombectomy of LUE AVF and L  IJ TDC completed on 08/13/2019 by Dr. Donnetta Hutching. Per VVS notes, may use TDC of AVF and recommending fistulogram to evaluate for central stenosis. Appreciate VVS assistance.  2. ESRD: Normally dial[facility]zes [facility] on [facility], last full HD was 9/23 due to occluded fistula. HD today, then resume regular schedule tomorrow. Will attempt to use AVF tomorrow, can use TDC if unable to cannulate.  3. HTN/volume:  BP moderately elevated this AM, 6kg over EDW due to missed dialysis on Friday. Will have dialysis today with UFG 3L as tolerated, then again tomorrow, hopefully to EDW.   4. Anemia: Hemoglobin 15.5. No ESA indicated.  5. Secondary hyperparathyroidism:   Corr Calcium 9.9. Parsabiv not on hospital formulary- resume as an outpatient. Continue calcitriol and binders. 6. Nutrition:  Renal diet with fluid restrictions.  7. Left BBB: Cardiology consulted in the ED, recommended continuing home meds and f.u at [facility] as scheduled.  8. T2DM: Poor control, insulin per primary team.   Anice Paganini, PA-C 08/14/2019, 11:28 AM  [facility] Pager: [phone]  Pt seen, examined and agree w A/P as above.  [name] Splinter  MD 08/14/2019, 1:59 PM

## 2019-08-14 NOTE — Progress Notes (Addendum)
  Postoperative hemodialysis access     Date of Surgery:  08/13/2019 Surgeon: Davan Hark  Subjective:  Sleeping-wakes easily and has no complaints.  PHYSICAL EXAMINATION:  Vitals:   08/13/19 2033 08/14/19 0433  BP: (!) 175/91 (!) 152/82  Pulse: 80 77  Resp: 12 16  Temp: 99.3 F (37.4 C) 98.1 F (36.7 C)  SpO2: 99% 96%    Incision is clean and dry Sensation in digits is intact;  There is  Thrill  There is bruit. The fistula is palpable and somewhat pulsatile Left radial pulse is easily palpable   ASSESSMENT/PLAN:  [name] Cruel Sr. is a 58 y.o. year old male who is s/p thrombectomy of LUA AVF and insertion of TDC POD 1.  -fistula is patent; may need fistulogram as it is somewhat pulsatile -pt does not have evidence of steal sx   Leontine Locket, PA-C [facility] [phone]   I have examined the patient, reviewed and agree with above.  Did not have dialysis yesterday.  Apparently has plans for dialysis today.  Does have pulsatile nature in his aneurysmal left upper arm AV fistula.  He does have a bruit.  Suspect there is central stenosis.  Can have hemodialysis via his fistula or his catheter.  Will need left upper arm fistulogram at some point.  This could possibly be done tomorrow as an add-on if he is still an inpatient.  If not can schedule it as an outpatient on a nondialysis day.  Following with you.  We will keep n.p.o. after midnight pending decision  Curt Jews, MD 08/14/2019 10:39 AM

## 2019-08-14 NOTE — Progress Notes (Signed)
PROGRESS NOTE  [name] Cruel Sr. [id number] DOB: 04-30-1961 DOA: 08/12/2019 PCP: Patient, No Pcp Per  Brief History:   59 y.o. male with medical history significant of ESRD on HD MWF, hypertension, nonischemic cardiomyopathy, type 2 diabetes presenting to the hospital for evaluation of left upper extremity AV fistula malfunction and shortness of breath.  Patient states he went for dialysis today and he started him 7 times but his AV fistula was not working.  They were not able to give him dialysis.  States he is feeling short of breath because he has fluid on him and wants dialysis.  Denies f/c, cp, n/v/d, abd pain.  Nephrology and vascular surgery were consulted to assist.  Pt underwent thrombectomy of LUE AVF and placement of L-IJ tunnel HD catheter on 08/13/19.  Assessment/Plan: Left upper arm AV fistula malfunction ESRD on HD MWF -Patient was unable to get dialysis on Friday due to AV fistula malfunction/thrombosis -s/p thrombectomy of left upper arm AV fistula, #2 left IJ tunneled hemodialysis catheter on 08/13/19 -planning HD 9/27 -still concerned about central stenosis of AVF-->plan fistulogram 9/28  Left bundle branch block -EKG with left bundle branch block and cardiology was consultedin the ED. High-sensitivity troponin 53> 49. -ACS ruled out and cardiology is not recommending ischemicwork-up. -Continue home Coreg, Entresto -has cardiology follow up at [facility] -Nonischemic CM-via cardiac catheterization, LVEF-35% most likely etiology -NYHA class II symptoms, no HF exacerbations no HF hospitalizations Hypertension -Continue home amlodipine, Coreg, Entresto -continue Entresto, coreg, atorvastatin  Uncontrolled DM2, with hyperglycemia -08/12/19 A1C--9.9 -restart 70/30 insulin at reduced dose -novolog sliding scale  GERD -Continue home PPI and H2 blocker  obesity Body mass index is 33.64 kg/m.     Disposition Plan:   Home  pending results of fistulogram 9/28 Family Communication:   No Family at bedside  Consultants:  Vascular surgery, renal  Code Status:  FULL   DVT Prophylaxis:  SCDs   Procedures: As Listed in Progress Note Above  Antibiotics: None      Subjective: Patient denies fevers, chills, headache, chest pain, dyspnea, nausea, vomiting, diarrhea, abdominal pain, dysuria, hematuria, hematochezia, and melena.   Objective: Vitals:   08/13/19 2033 08/14/19 0300 08/14/19 0433 08/14/19 0855  BP: (!) 175/91  (!) 152/82 (!) 145/77  Pulse: 80  77 72  Resp: 12  16 18   Temp: 99.3 F (37.4 C)  98.1 F (36.7 C) 98.5 F (36.9 C)  TempSrc: Oral  Oral Oral  SpO2: 99%  96% 96%  Weight:  119 kg    Height:        Intake/Output Summary (Last 24 hours) at 08/14/2019 1043 Last data filed at 08/14/2019 0800 Gross per 24 hour  Intake 1380 ml  Output 75 ml  Net 1305 ml   Weight change: 3.333 kg Exam:   General:  Pt is alert, follows commands appropriately, not in acute distress  HEENT: No icterus, No thrush, No neck mass, [place]/AT  Cardiovascular: RRR, S1/S2, no rubs, no gallops  Respiratory: bibasilar crackles, no wheeze  Abdomen: Soft/+BS, non tender, non distended, no guarding  Extremities: No edema, No lymphangitis, No petechiae, No rashes, no synovitis   Data Reviewed: I have personally reviewed following labs and imaging studies Basic Metabolic Panel: Recent Labs  Lab 08/12/19 1708 08/13/19 0429  NA 132* 132*  K 4.8 5.0  CL 89* 94*  CO2 26 24  GLUCOSE 262* 344*  BUN 59* 69*  CREATININE 14.13* 15.25*  CALCIUM 9.6 9.2  PHOS  --  3.7   Liver Function Tests: Recent Labs  Lab 08/13/19 0429  ALBUMIN 3.1*   No results for input(s): LIPASE, AMYLASE in the last 168 hours. No results for input(s): AMMONIA in the last 168 hours. Coagulation Profile: No results for input(s): INR, PROTIME in the last 168 hours. CBC: Recent Labs  Lab 08/12/19 1708  WBC 7.9  HGB 15.5   HCT 48.0  MCV 99.0  PLT 180   Cardiac Enzymes: No results for input(s): CKTOTAL, CKMB, CKMBINDEX, TROPONINI in the last 168 hours. BNP: Invalid input(s): POCBNP CBG: Recent Labs  Lab 08/13/19 0949 08/13/19 1305 08/13/19 1624 08/13/19 2030 08/14/19 0654  GLUCAP 251* 200* 179* 292* 425*   HbA1C: Recent Labs    08/12/19 1708  HGBA1C 9.9*   Urine analysis:    Component Value Date/Time   COLORURINE YELLOW 09/20/2018 2118   APPEARANCEUR CLEAR 09/20/2018 2118   LABSPEC 1.012 09/20/2018 2118   PHURINE 8.0 09/20/2018 2118   GLUCOSEU >=500 (A) 09/20/2018 2118   HGBUR NEGATIVE 09/20/2018 2118   BILIRUBINUR NEGATIVE 09/20/2018 2118   KETONESUR NEGATIVE 09/20/2018 2118   PROTEINUR >=300 (A) 09/20/2018 2118   NITRITE NEGATIVE 09/20/2018 2118   LEUKOCYTESUR SMALL (A) 09/20/2018 2118   Sepsis Labs: @LABRCNTIP (procalcitonin:4,lacticidven:4) ) Recent Results (from the past 240 hour(s))  SARS CORONAVIRUS 2 (Jeron Grahn 6-24 HRS) Nasopharyngeal Nasopharyngeal Swab     Status: None   Collection Time: 08/12/19  8:51 PM   Specimen: Nasopharyngeal Swab  Result Value Ref Range Status   SARS Coronavirus 2 NEGATIVE NEGATIVE Final    Comment: (NOTE) SARS-CoV-2 target nucleic acids are NOT DETECTED. The SARS-CoV-2 RNA is generally detectable in upper and lower respiratory specimens during the acute phase of infection. Negative results do not preclude SARS-CoV-2 infection, do not rule out co-infections with other pathogens, and should not be used as the sole basis for treatment or other patient management decisions. Negative results must be combined with clinical observations, patient history, and epidemiological information. The expected result is Negative. Fact Sheet for Patients: SugarRoll.be Fact Sheet for Healthcare Providers: https://www.woods-mathews.com/ This test is not yet approved or cleared by the [place] FDA and  has been authorized  for detection and/or diagnosis of SARS-CoV-2 by FDA under an Emergency Use Authorization (EUA). This EUA will remain  in effect (meaning this test can be used) for the duration of the COVID-19 declaration under Section 56 4(b)(1) of the Act, 21 U.S.C. section 360bbb-3(b)(1), unless the authorization is terminated or revoked sooner. Performed at [facility], [place] [address] [place], [place] [address]   MRSA PCR Screening     Status: None   Collection Time: 08/13/19  3:00 AM   Specimen: Nasal Mucosa; Nasopharyngeal  Result Value Ref Range Status   MRSA by PCR NEGATIVE NEGATIVE Final    Comment:        The GeneXpert MRSA Assay (FDA approved for NASAL specimens only), is one component of a comprehensive MRSA colonization surveillance program. It is not intended to diagnose MRSA infection nor to guide or monitor treatment for MRSA infections. Performed at [facility], [place] [address], [place], [place] [address]      Scheduled Meds: . amLODipine  10 mg Oral Daily  . atorvastatin  40 mg Oral QPM  . carvedilol  25 mg Oral BID  . Chlorhexidine Gluconate Cloth  6 each Topical Q0600  . famotidine  20 mg Oral QHS  .  insulin aspart  0-5 Units Subcutaneous QHS  . insulin aspart  0-9 Units Subcutaneous TID WC  . insulin aspart protamine- aspart  15 Units Subcutaneous BID WC  . pantoprazole  20 mg Oral Daily  . sacubitril-valsartan  1 tablet Oral BID  . sucroferric oxyhydroxide  1,000 mg Oral TID WC   Continuous Infusions: . sodium chloride 10 mL/hr at 08/13/19 [id number]    Procedures/Studies: Ct Abdomen Pelvis Wo Contrast  Result Date: 08/05/2019 CLINICAL DATA:  Syncope with abdominal pain EXAM: CT ABDOMEN AND PELVIS WITHOUT CONTRAST TECHNIQUE: Multidetector CT imaging of the abdomen and pelvis was performed following the standard protocol without IV contrast. COMPARISON:  09/23/2018 FINDINGS: Lower chest: Lung bases demonstrate no acute consolidation or effusion. Heart size  is normal. Coronary vascular calcification. Small amount of left gynecomastia. Hepatobiliary: No focal liver abnormality is seen. No gallstones, gallbladder wall thickening, or biliary dilatation. Pancreas: Unremarkable. No pancreatic ductal dilatation or surrounding inflammatory changes. Spleen: Normal in size without focal abnormality. Adrenals/Urinary Tract: Adrenal glands are normal. Slightly atrophic kidneys with prominent intrarenal vascular calcification. No hydronephrosis. Possible punctate stone lower pole right kidney. The bladder is thick walled but nearly empty Stomach/Bowel: Stomach is within normal limits. Appendix appears normal. No evidence of bowel wall thickening, distention, or inflammatory changes. Vascular/Lymphatic: Moderate aortic atherosclerosis. No significantly enlarged lymph nodes. No aneurysm Reproductive: Prostate is unremarkable. Other: Negative for free air or free fluid Musculoskeletal: Grade 1 anterolisthesis L4 on L5 with chronic L4 pars defect. Degenerative changes. IMPRESSION: 1. No CT evidence for acute intra-abdominal or pelvic abnormality. 2. Slightly atrophic kidneys. Slightly thick-walled urinary bladder, likely due to under distension Electronically Signed   By: Donavan Foil M.D.   On: 08/05/2019 23:01   Dg Chest 1 View  Result Date: 08/13/2019 CLINICAL DATA:  Central line placement EXAM: CHEST  1 VIEW COMPARISON:  August 12, 2019 FINDINGS: The double lumen central line terminates just in the right side of the atrium, approximately 3 cm below the caval atrial junction. No pneumothorax. Cardiomediastinal silhouette is stable. The lungs remain clear. IMPRESSION: The new left central line terminates in the right side of the atrium 3 cm below the caval atrial junction. No pneumothorax. No other change. Electronically Signed   By: Dorise Bullion III M.D   On: 08/13/2019 13:59   Dg Chest 2 View  Result Date: 08/12/2019 CLINICAL DATA:  Shortness of breath EXAM: CHEST -  2 VIEW COMPARISON:  08/05/2019 FINDINGS: Heart and mediastinal contours are within normal limits. No focal opacities or effusions. No acute bony abnormality. IMPRESSION: No active cardiopulmonary disease. Electronically Signed   By: Rolm Baptise M.D.   On: 08/12/2019 17:32   Dg Chest 2 View  Result Date: 08/05/2019 CLINICAL DATA:  Nausea vomiting and abdominal pain EXAM: CHEST - 2 VIEW COMPARISON:  09/20/2018 FINDINGS: The heart size and mediastinal contours are within normal limits. Both lungs are clear. Mild degenerative changes of the spine. IMPRESSION: No active cardiopulmonary disease. Electronically Signed   By: Donavan Foil M.D.   On: 08/05/2019 22:41   Ct Head Wo Contrast  Result Date: 08/05/2019 CLINICAL DATA:  Syncope EXAM: CT HEAD WITHOUT CONTRAST TECHNIQUE: Contiguous axial images were obtained from the base of the skull through the vertex without intravenous contrast. COMPARISON:  Report 02/06/2008 FINDINGS: Brain: No acute territorial infarction, hemorrhage or intracranial mass. Chronic appearing lacunar infarcts in the bilateral caudate. Age indeterminate hypodensity in the right thalamus and left basal ganglia. Mild small vessel ischemic change of  the white matter. Normal ventricle size. Vascular: No hyperdense vessels.  Carotid vascular calcification Skull: Normal. Negative for fracture or focal lesion. Sinuses/Orbits: No acute finding. Other: None IMPRESSION: 1. Negative for acute intracranial hemorrhage or mass lesion 2. Suspected chronic lacunar infarcts in the bilateral caudate with age indeterminate hypodensity/lacunar infarcts in the right thalamus and left basal ganglia. 3. Mild small vessel ischemic changes of the white matter Electronically Signed   By: Donavan Foil M.D.   On: 08/05/2019 22:56   Dg C-arm 1-60 Min-no Report  Result Date: 08/13/2019 Fluoroscopy was utilized by the requesting physician.  No radiographic interpretation.    Orson Eva, DO  [facility]  Pager [phone]  If 7PM-7AM, please contact night-coverage [email or web address] Password [email or web address] 08/14/2019, 10:43 AM   LOS: 1 day

## 2019-08-15 ENCOUNTER — Encounter (HOSPITAL_COMMUNITY)
Admission: EM | Disposition: A | Discharge status: Home / Self Care | Payer: Self-pay | Source: Home / Self Care | Attending: Internal Medicine

## 2019-08-15 ENCOUNTER — Encounter (HOSPITAL_COMMUNITY): Payer: Self-pay | Admitting: Vascular Surgery

## 2019-08-15 DIAGNOSIS — E1122 Type 2 diabetes mellitus with diabetic chronic kidney disease: Secondary | ICD-10-CM

## 2019-08-15 DIAGNOSIS — Z794 Long term (current) use of insulin: Secondary | ICD-10-CM

## 2019-08-15 DIAGNOSIS — Z992 Dependence on renal dialysis: Secondary | ICD-10-CM

## 2019-08-15 HISTORY — PX: PERIPHERAL VASCULAR BALLOON ANGIOPLASTY: CATH118281

## 2019-08-15 HISTORY — PX: A/V FISTULAGRAM: CATH118298

## 2019-08-15 LAB — GLUCOSE, CAPILLARY
Glucose-Capillary: 389 mg/dL — ABNORMAL HIGH (ref 70–99)
Glucose-Capillary: 137 mg/dL — ABNORMAL HIGH (ref 70–99)
Glucose-Capillary: 90 mg/dL (ref 70–99)
Glucose-Capillary: 163 mg/dL — ABNORMAL HIGH (ref 70–99)
Glucose-Capillary: 180 mg/dL — ABNORMAL HIGH (ref 70–99)

## 2019-08-15 SURGERY — A/V FISTULAGRAM
Anesthesia: LOCAL

## 2019-08-15 MED ORDER — HEPARIN (PORCINE) IN NACL 1000-0.9 UT/500ML-% IV SOLN
INTRAVENOUS | Status: AC
  Filled 2019-08-15: qty 500

## 2019-08-15 MED ORDER — HEPARIN SODIUM (PORCINE) 1000 UNIT/ML IJ SOLN
3.8000 mL | Freq: Once | INTRAMUSCULAR | Status: AC
  Administered 2019-08-15: 3800 [IU] via INTRAVENOUS

## 2019-08-15 MED ORDER — HEPARIN SODIUM (PORCINE) 1000 UNIT/ML IJ SOLN
INTRAMUSCULAR | Status: AC
  Filled 2019-08-15: qty 4

## 2019-08-15 MED ORDER — CHLORHEXIDINE GLUCONATE CLOTH 2 % EX PADS
6.0000 | MEDICATED_PAD | Freq: Every day | CUTANEOUS | Status: DC
  Administered 2019-08-16: 6 via TOPICAL

## 2019-08-15 MED ORDER — LIDOCAINE HCL (PF) 1 % IJ SOLN
INTRAMUSCULAR | Status: AC
  Filled 2019-08-15: qty 30

## 2019-08-15 MED ORDER — LIDOCAINE HCL (PF) 1 % IJ SOLN
INTRAMUSCULAR | Status: DC | PRN
  Administered 2019-08-15: 5 mL

## 2019-08-15 MED ORDER — HEPARIN (PORCINE) IN NACL 1000-0.9 UT/500ML-% IV SOLN
INTRAVENOUS | Status: DC | PRN
  Administered 2019-08-15: 500 mL

## 2019-08-15 MED ORDER — IODIXANOL 320 MG/ML IV SOLN
INTRAVENOUS | Status: DC | PRN
  Administered 2019-08-15: 13:00:00 55 mL via INTRAVENOUS

## 2019-08-15 SURGICAL SUPPLY — 19 items
BAG SNAP BAND KOVER 36X36 (MISCELLANEOUS) ×3 IMPLANT
BALLN MUSTANG 10.0X40 75 (BALLOONS) ×3
BALLN MUSTANG 12X60X75 (BALLOONS) ×3
BALLN MUSTANG 8.0X40 75 (BALLOONS) ×3
BALLOON MUSTANG 10.0X40 75 (BALLOONS) ×2 IMPLANT
BALLOON MUSTANG 12X60X75 (BALLOONS) ×2 IMPLANT
BALLOON MUSTANG 8.0X40 75 (BALLOONS) ×2 IMPLANT
CATH ANGIO 5F BER2 65CM (CATHETERS) ×3 IMPLANT
COVER DOME SNAP 22 D (MISCELLANEOUS) ×3 IMPLANT
KIT ENCORE 26 ADVANTAGE (KITS) ×3 IMPLANT
KIT MICROPUNCTURE NIT STIFF (SHEATH) ×3 IMPLANT
PROTECTION STATION PRESSURIZED (MISCELLANEOUS) ×3
SHEATH PINNACLE R/O II 7F 4CM (SHEATH) ×3 IMPLANT
SHEATH PROBE COVER 6X72 (BAG) ×3 IMPLANT
STATION PROTECTION PRESSURIZED (MISCELLANEOUS) ×2 IMPLANT
STOPCOCK MORSE 400PSI 3WAY (MISCELLANEOUS) ×3 IMPLANT
TRAY PV CATH (CUSTOM PROCEDURE TRAY) ×3 IMPLANT
TUBING CIL FLEX 10 FLL-RA (TUBING) ×3 IMPLANT
WIRE BENTSON .035X145CM (WIRE) ×3 IMPLANT

## 2019-08-15 NOTE — Plan of Care (Signed)
  Problem: Health Behavior/Discharge Planning: Goal: Ability to manage health-related needs will improve Outcome: Progressing   Problem: Clinical Measurements: Goal: Diagnostic test results will improve Outcome: Progressing   

## 2019-08-15 NOTE — Progress Notes (Addendum)
[facility] Progress Note   Subjective:   Seen in room. Completed HD last night via [place] with 2L UF.  Feels better today. Denies CP/SOB.  On schedule for fistulogram today per [facility] today.   Objective Vitals:   08/15/19 0040 08/15/19 0129 08/15/19 0517 08/15/19 0905  BP: (!) 151/92 (!) 186/97 (!) 152/89 (!) 146/73  Pulse: 81 82 80 75  Resp: 18 20 20 18   Temp: 98.3 F (36.8 C) 99.1 F (37.3 C) 98.5 F (36.9 C) 98.4 F (36.9 C)  TempSrc: Oral Oral Oral Oral  SpO2: 95% 100% 96% 95%  Weight: 118.7 kg     Height:       Physical Exam General: Well developed, alert male in NAD Heart: RRR, no murmurs rubs or fallops Lungs: CTA bilaterally without wheezing, rhonchi or rales Abdomen: Soft, non-tender, non-distended. +BS Extremities:  LUE swollen. No edema b/l lower extremities Dialysis Access:  New L IJ TDC  In place; LUE AVF + bruit, pulsatile  Additional Objective Labs: Basic Metabolic Panel: Recent Labs  Lab 08/12/19 1708 08/13/19 0429 08/14/19 2051  NA 132* 132* 133*  K 4.8 5.0 5.1  CL 89* 94* 94*  CO2 26 24 23   GLUCOSE 262* 344* 123*  BUN 59* 69* 91*  CREATININE 14.13* 15.25* 18.16*  CALCIUM 9.6 9.2 9.3  PHOS  --  3.7 4.0   Liver Function Tests: Recent Labs  Lab 08/13/19 0429 08/14/19 2051  ALBUMIN 3.1* 3.1*   CBC: Recent Labs  Lab 08/12/19 1708 08/14/19 2051  WBC 7.9 7.5  HGB 15.5 13.4  HCT 48.0 40.8  MCV 99.0 97.4  PLT 180 162   Blood Culture    Component Value Date/Time   SDES BLOOD DRAWN BY DIALYSIS 09/22/2018 1159   SPECREQUEST  09/22/2018 1159    BOTTLES DRAWN AEROBIC AND ANAEROBIC Blood Culture adequate volume   CULT  09/22/2018 1159    NO GROWTH 7 DAYS Performed at [facility], [address], [place],  [address]    REPTSTATUS 09/29/2018 FINAL 09/22/2018 1159    CBG: Recent Labs  Lab 08/14/19 0654 08/14/19 1111 08/14/19 1603 08/15/19 0719 08/15/19 1102  GLUCAP 425* 264* 137* 137* 90     Studies/Results: Dg Chest 1 View  Result Date: 08/13/2019 CLINICAL DATA:  Central line placement EXAM: CHEST  1 VIEW COMPARISON:  August 12, 2019 FINDINGS: The double lumen central line terminates just in the right side of the atrium, approximately 3 cm below the caval atrial junction. No pneumothorax. Cardiomediastinal silhouette is stable. The lungs remain clear. IMPRESSION: The new left central line terminates in the right side of the atrium 3 cm below the caval atrial junction. No pneumothorax. No other change. Electronically Signed   By: Dorise Bullion III M.D   On: 08/13/2019 13:59   Dg C-arm 1-60 Min-no Report  Result Date: 08/13/2019 Fluoroscopy was utilized by the requesting physician.  No radiographic interpretation.   Medications: . sodium chloride 10 mL/hr at 08/13/19 0959   . amLODipine  10 mg Oral Daily  . atorvastatin  40 mg Oral QPM  . carvedilol  25 mg Oral BID  . Chlorhexidine Gluconate Cloth  6 each Topical Q0600  . famotidine  20 mg Oral QHS  . insulin aspart  0-5 Units Subcutaneous QHS  . insulin aspart  0-9 Units Subcutaneous TID WC  . insulin aspart protamine- aspart  15 Units Subcutaneous BID WC  . pantoprazole  20 mg Oral Daily  . sacubitril-valsartan  1  tablet Oral BID  . sucroferric oxyhydroxide  1,000 mg Oral TID WC    Dialysis Orders: [facility] MWF 4hr 29min 180NRe EDW 113kg 3K/2.5Ca  Parsabiv 2.32mmg TIW Calcitriol 1.25 mcg PO TIW Heparin 6000 U bolus   Assessment/Plan: 1. Thrombosed fistula: Unable to dialyze on 9/25. Thrombectomy of LUE AVF and L IJ TDC completed on 9/26  by Dr. Donnetta Hutching. Per VVS notes, may use TDC or AVF and recommending fistulogram to evaluate for central stenosis. On schedule for fistulogram today per VVS.   2. ESRD: HD MWF;  Off schedule to access issues. Had HD Sunday night. Plans for fistulogram today.  HD after fistulogram if schedule allows.  3. HTN/volume:  Net UF 2L on 9/27 Still 5kg over EDW by weights. Push to goal  4.  Anemia: No ESA indicated.  5. Secondary hyperparathyroidism: Parsabiv not on hospital formulary- resume as an outpatient. Continue calcitriol and binders. 6. Nutrition:  Renal diet with fluid restrictions.  7. Left BBB: ACS ruled out. No further w/u this admission. Continue home meds.  8. NICM  9. T2DM:  insulin per primary team.   Lynnda Child PA-C [place] Pager [phone] 08/15/2019,11:11 AM

## 2019-08-15 NOTE — Progress Notes (Signed)
  Progress Note    08/15/2019 12:04 PM Day of Surgery  Subjective:  No overnight issues  Vitals:   08/15/19 0905 08/15/19 1155  BP: (!) 146/73   Pulse: 75   Resp: 18   Temp: 98.4 F (36.9 C)   SpO2: 95% 98%    Physical Exam: aaox3 Pulsatility in left arm avf  CBC    Component Value Date/Time   WBC 7.5 08/14/2019 2051   RBC 4.19 (L) 08/14/2019 2051   HGB 13.4 08/14/2019 2051   HCT 40.8 08/14/2019 2051   PLT 162 08/14/2019 2051   MCV 97.4 08/14/2019 2051   MCH 32.0 08/14/2019 2051   MCHC 32.8 08/14/2019 2051   RDW 15.6 (H) 08/14/2019 2051   LYMPHSABS 1.6 08/05/2019 1848   MONOABS 1.2 (H) 08/05/2019 1848   EOSABS 0.3 08/05/2019 1848   BASOSABS 0.1 08/05/2019 1848    BMET    Component Value Date/Time   NA 133 (L) 08/14/2019 2051   K 5.1 08/14/2019 2051   CL 94 (L) 08/14/2019 2051   CO2 23 08/14/2019 2051   GLUCOSE 123 (H) 08/14/2019 2051   BUN 91 (H) 08/14/2019 2051   CREATININE 18.16 (H) 08/14/2019 2051   CALCIUM 9.3 08/14/2019 2051   GFRNONAA 3 (L) 08/14/2019 2051   GFRAA 3 (L) 08/14/2019 2051    INR No results found for: INR   Intake/Output Summary (Last 24 hours) at 08/15/2019 1204 Last data filed at 08/15/2019 0200 Gross per 24 hour  Intake 460 ml  Output 2000 ml  Net -1540 ml     Assessment:  58 y.o. male is s/p left arm avf thrombectomy  Plan: fistulogram today  Brandon C. Donzetta Matters, MD [facility] Office: [phone] Pager: [phone]  08/15/2019 12:04 PM

## 2019-08-15 NOTE — Progress Notes (Signed)
[facility] PROGRESS NOTE  [name] Cruel Sr. [id number] DOB: Jan 12, 1961 DOA: 08/12/2019 PCP: Patient, No Pcp Per  Brief summary   58 y.o.malewith medical history significant ofESRD on HD MWF, hypertension, nonischemic cardiomyopathy, type 2 diabetes presenting to the hospital for evaluation of left upper extremity AV fistula malfunction and shortness of breath.Patient states he went for dialysis and he started him 7 times but his AV fistula was not working. They were not able to give him dialysis. States he is feeling short of breath because he hasfluid on him and wants dialysis.  Denies f/c, cp, n/v/d, abd pain.  Nephrology and vascular surgery were consulted to assist.  Pt underwent thrombectomy of LUE AVF and placement of L-IJ tunnel HD catheter on 08/13/19.  Assessment/Plan:  Left upper arm AV fistula malfunction ESRD on HD MWF -Patient was unable to get dialysison Fridaydue to AV fistula malfunction/thrombosis -s/p thrombectomy of left upper arm AV fistula, #2 left IJ tunneled hemodialysis catheter on 08/13/19 -cont HD per nephrology.  -still concerned about central stenosis of AVF-->plan fistulogram 9/28  Left bundle branch block -EKG with left bundle branch block and cardiology was consultedin the ED. High-sensitivity troponin 53>49. -ACS ruled out and cardiology is not recommending ischemicwork-up. -Continue home Coreg, Entresto -has cardiology follow up at [facility] [place] -Nonischemic CM-via cardiac catheterization, LVEF-35% most likely etiology -NYHA class II symptoms, no HF exacerbations no HF hospitalizations Hypertension -Continue home amlodipine, Coreg, Entresto -continue Entresto, coreg, atorvastatin  Uncontrolled DM2, with hyperglycemia -08/12/19 A1C--9.9 -restarted 70/30 insulin at reduced dose -novolog sliding scale  GERD -Continue home PPI and H2 blocker  obesity Body mass index is 33.64 kg/m.  Code Status:  full Family Communication: d/w patient, RN (indicate person spoken with, relationship, and if by phone, the number) Disposition Plan: home soon. 24-48 hrs. Pend fitulogram. HD   Consultants:  Nephrology    Procedures:  Pend fistulogram   Antibiotics: Anti-infectives (From admission, onward)   Start     Dose/Rate Route Frequency Ordered Stop   08/13/19 1000  ceFAZolin (ANCEF) IVPB 2g/100 mL premix  Status:  Discontinued     2 g 200 mL/hr over 30 Minutes Intravenous  Once 08/13/19 0951 08/13/19 1258   08/13/19 0941  ceFAZolin (ANCEF) 2-4 GM/100ML-% IVPB    Note to Pharmacy: Henrine Screws   : cabinet override      08/13/19 0941 08/13/19 2144        (indicate start date, and stop date if known)  HPI/Subjective: No acute distress. He reports feeling well. swelling of right arm. No weakness. Awaiting vascular procedure   Objective: Vitals:   08/15/19 0129 08/15/19 0517  BP: (!) 186/97 (!) 152/89  Pulse: 82 80  Resp: 20 20  Temp: 99.1 F (37.3 C) 98.5 F (36.9 C)  SpO2: 100% 96%    Intake/Output Summary (Last 24 hours) at 08/15/2019 0818 Last data filed at 08/15/2019 0200 Gross per 24 hour  Intake 460 ml  Output 2000 ml  Net -1540 ml   Filed Weights   08/14/19 0300 08/14/19 2100 08/15/19 0040  Weight: 119 kg 120.7 kg 118.7 kg    Exam:   General:  No distress   Cardiovascular: s1,s2 rrr  Respiratory: CTA BL  Abdomen: soft, nt   Musculoskeletal: no edema in legs    Data Reviewed: Basic Metabolic Panel: Recent Labs  Lab 08/12/19 1708 08/13/19 0429 08/14/19 2051  NA 132* 132* 133*  K 4.8 5.0 5.1  CL 89* 94* 94*  CO2  26 24 23   GLUCOSE 262* 344* 123*  BUN 59* 69* 91*  CREATININE 14.13* 15.25* 18.16*  CALCIUM 9.6 9.2 9.3  PHOS  --  3.7 4.0   Liver Function Tests: Recent Labs  Lab 08/13/19 0429 08/14/19 2051  ALBUMIN 3.1* 3.1*   No results for input(s): LIPASE, AMYLASE in the last 168 hours. No results for input(s): AMMONIA in the last 168  hours. CBC: Recent Labs  Lab 08/12/19 1708 08/14/19 2051  WBC 7.9 7.5  HGB 15.5 13.4  HCT 48.0 40.8  MCV 99.0 97.4  PLT 180 162   Cardiac Enzymes: No results for input(s): CKTOTAL, CKMB, CKMBINDEX, TROPONINI in the last 168 hours. BNP (last 3 results) No results for input(s): BNP in the last 8760 hours.  ProBNP (last 3 results) No results for input(s): PROBNP in the last 8760 hours.  CBG: Recent Labs  Lab 08/13/19 2030 08/14/19 0654 08/14/19 1111 08/14/19 1603 08/15/19 0719  GLUCAP 292* 425* 264* 137* 137*    Recent Results (from the past 240 hour(s))  SARS CORONAVIRUS 2 (TAT 6-24 HRS) Nasopharyngeal Nasopharyngeal Swab     Status: None   Collection Time: 08/12/19  8:51 PM   Specimen: Nasopharyngeal Swab  Result Value Ref Range Status   SARS Coronavirus 2 NEGATIVE NEGATIVE Final    Comment: (NOTE) SARS-CoV-2 target nucleic acids are NOT DETECTED. The SARS-CoV-2 RNA is generally detectable in upper and lower respiratory specimens during the acute phase of infection. Negative results do not preclude SARS-CoV-2 infection, do not rule out co-infections with other pathogens, and should not be used as the sole basis for treatment or other patient management decisions. Negative results must be combined with clinical observations, patient history, and epidemiological information. The expected result is Negative. Fact Sheet for Patients: SugarRoll.be Fact Sheet for Healthcare Providers: https://www.woods-mathews.com/ This test is not yet approved or cleared by the [place] FDA and  has been authorized for detection and/or diagnosis of SARS-CoV-2 by FDA under an Emergency Use Authorization (EUA). This EUA will remain  in effect (meaning this test can be used) for the duration of the COVID-19 declaration under Section 56 4(b)(1) of the Act, 21 U.S.C. section 360bbb-3(b)(1), unless the authorization is terminated or revoked  sooner. Performed at [facility], [place] [address], [place], [place] [address]   MRSA PCR Screening     Status: None   Collection Time: 08/13/19  3:00 AM   Specimen: Nasal Mucosa; Nasopharyngeal  Result Value Ref Range Status   MRSA by PCR NEGATIVE NEGATIVE Final    Comment:        The GeneXpert MRSA Assay (FDA approved for NASAL specimens only), is one component of a comprehensive MRSA colonization surveillance program. It is not intended to diagnose MRSA infection nor to guide or monitor treatment for MRSA infections. Performed at [facility], [place] [address], [place], [place] [address]      Studies: Dg Chest 1 View  Result Date: 08/13/2019 CLINICAL DATA:  Central line placement EXAM: CHEST  1 VIEW COMPARISON:  August 12, 2019 FINDINGS: The double lumen central line terminates just in the right side of the atrium, approximately 3 cm below the caval atrial junction. No pneumothorax. Cardiomediastinal silhouette is stable. The lungs remain clear. IMPRESSION: The new left central line terminates in the right side of the atrium 3 cm below the caval atrial junction. No pneumothorax. No other change. Electronically Signed   By: Dorise Bullion III M.D   On: 08/13/2019  13:59   Dg C-arm 1-60 Min-no Report  Result Date: 08/13/2019 Fluoroscopy was utilized by the requesting physician.  No radiographic interpretation.    Scheduled Meds: . amLODipine  10 mg Oral Daily  . atorvastatin  40 mg Oral QPM  . carvedilol  25 mg Oral BID  . Chlorhexidine Gluconate Cloth  6 each Topical Q0600  . famotidine  20 mg Oral QHS  . insulin aspart  0-5 Units Subcutaneous QHS  . insulin aspart  0-9 Units Subcutaneous TID WC  . insulin aspart protamine- aspart  15 Units Subcutaneous BID WC  . pantoprazole  20 mg Oral Daily  . sacubitril-valsartan  1 tablet Oral BID  . sucroferric oxyhydroxide  1,000 mg Oral TID WC   Continuous Infusions: . sodium chloride 10 mL/hr at 08/13/19 [id number]     Principal Problem:   Dialysis AV fistula malfunction (HCC) Active Problems:   ESRD (end stage renal disease) ([place])   DM2 (diabetes mellitus, type 2) (HCC)   HTN (hypertension)   LBBB (left bundle branch block)   Problem with vascular access   Chronic systolic CHF (congestive heart failure) ([place])    Time spent: >25 minutes     Kinnie Feil  [facility] Pager [phone]. If 7PM-7AM, please contact night-coverage at [email or web address], password [facility] 08/15/2019, 8:18 AM  LOS: 2 days

## 2019-08-15 NOTE — Op Note (Signed)
    Patient name: [name] TAUTE Sr. MRN: [id number] DOB: 23-Jun-1961 Sex: male  08/15/2019 Pre-operative Diagnosis: End-stage renal disease, malfunction left arm AV fistula Post-operative diagnosis:  Same Surgeon:  Eda Paschal. Donzetta Matters, MD Procedure Performed: 1.  Ultrasound-guided cannulation left arm AV fistula 2.  Left arm shuntogram 3.  Balloon angioplasty of left subclavian vein and left cephalic arch with 8, 10, 12 mm balloons  Indications: 58 year old male recently underwent left upper arm AV fistula thrombectomy.  Now has continued pulsatility in the fistula.  He is indicated for fistulogram possible invention.  Findings: Has a negative fistula in the left arm with pulsatility.  There is a tight stenosis at the level of the thoracic outlet balloon with a 1012 mm balloons.  Stenosis still exist however reflux is improved at completion.  Cephalic arch also has stenosis approximately 80% resolved to less than 30% at completion after balloon angioplasty.   Procedure:  The patient was identified in the holding area and taken to room 8.  The patient was then placed supine on the table and prepped and draped in the usual sterile fashion.  A time out was called.  Ultrasound was used to evaluate the left arm AV fistula which was cannulated with direct ultrasound visualization a micropuncture needle followed the wire and sheath.  Images were saved from record.  Fistulogram was performed.  We then crossed stenosis with Bentson wire placed a 7 French sheath.  We ballooned 2 areas with 8 and 10 and 12 mm balloons.  Completion demonstrated the above findings.  We now had improved thrill in the fistula.  Satisfied we remove the wire and sheath suture ligated the cannulation of 4 Monocryl.  He tolerated procedure without any complication.  Contrast: 55 cc    Netasha Wehrli C. Donzetta Matters, MD [facility] Office: [phone] Pager: [phone]

## 2019-08-16 LAB — GLUCOSE, CAPILLARY: Glucose-Capillary: 229 mg/dL — ABNORMAL HIGH (ref 70–99)

## 2019-08-16 MED ORDER — BISACODYL 5 MG PO TBEC: 5.0000 mg | DELAYED_RELEASE_TABLET | Freq: Every evening | ORAL | 0 refills | Status: DC | PRN

## 2019-08-16 MED ORDER — FAMOTIDINE 20 MG PO TABS: 20.0000 mg | ORAL_TABLET | Freq: Every day | ORAL | Status: DC

## 2019-08-16 NOTE — Progress Notes (Signed)
Renal Navigator received message from CM/W. Estelle Grumbles that patient will be discharged today. Patient is off schedule for HD today due to de-clot yesterday. Renal Navigator contacted [facility] OP HD clinic to see if they can accommodate him today for HD. Charge RN/Laura confirmed that patient can come for treatment today.  Renal Navigator spoke with Nephrologist/Dr. Jonnie Finner. Renal Navigator spoke with patient who states he has a visitor here in the parking lot waiting to come in who can take him home and has someone who can pick him up from HD clinic after treatment. He is very pleased with this plan so that he can be discharged now.  Patient will have OP HD treatment now and not have HD in the hospital prior to discharge.   Alphonzo Cruise, [place] Renal Navigator [phone]

## 2019-08-16 NOTE — Care Management Important Message (Signed)
Important Message  Patient Details  Name: [name] GUBBELS Sr. MRN: [id number] Date of Birth: 17-Apr-1961   Medicare Important Message Given:  Yes     Atsushi Yom 08/16/2019, 2:25 PM

## 2019-08-16 NOTE — Progress Notes (Addendum)
[facility] PROGRESS NOTE  [name] Cruel Sr. [id number] DOB: 1961/04/15 DOA: 08/12/2019 PCP: Patient, No Pcp Per  Brief summary   58 y.o.malewith medical history significant ofESRD on HD MWF, hypertension, nonischemic cardiomyopathy, type 2 diabetes presenting to the hospital for evaluation of left upper extremity AV fistula malfunction and shortness of breath.Patient states he went for dialysis and he started him 7 times but his AV fistula was not working. They were not able to give him dialysis. States he is feeling short of breath because he hasfluid on him and wants dialysis.  Denies f/c, cp, n/v/d, abd pain.  Nephrology and vascular surgery were consulted to assist.  Pt underwent thrombectomy of LUE AVF and placement of L-IJ tunnel HD catheter on 08/13/19.  Assessment/Plan:  Left upper arm AV fistula malfunction ESRD on HD MWF -Patient was unable to get dialysison Fridaydue to AV fistula malfunction/thrombosis -s/p thrombectomy of left upper arm AV fistula, #2 left IJ tunneled hemodialysis catheter on 08/13/19 -s/p Left arm shuntogram.  Balloon angioplasty of left subclavian vein and left cephalic arch with 8, 10, 12 mm balloons -possible home today if cleared by vascular, nephrology. cont HD per nephrology. Cont per vascular surgery.   Left bundle branch block -EKG with left bundle branch block and cardiology was consultedin the ED. High-sensitivity troponin 53>49. -ACS ruled out and cardiology is not recommending ischemicwork-up. -Continue home Coreg, Entresto -has cardiology follow up at [facility] -Nonischemic CM-via cardiac catheterization, LVEF-35% most likely etiology -NYHA class II symptoms, no HF exacerbations no HF hospitalizations Hypertension -Continue home amlodipine, Coreg, Entresto -continue Entresto, coreg, atorvastatin  Uncontrolled DM2, with hyperglycemia -08/12/19 A1C--9.9 -restarted 70/30 insulin at reduced  dose -novolog sliding scale  GERD -Continue home PPI and H2 blocker  obesity Body mass index is 33.64 kg/m.  Code Status: full Family Communication: d/w patient, RN (indicate person spoken with, relationship, and if by phone, the number) Disposition Plan: home possible today. If cleared by vascular    Consultants:  Nephrology    Procedures:  Pend fistulogram   Antibiotics: Anti-infectives (From admission, onward)   Start     Dose/Rate Route Frequency Ordered Stop   08/13/19 1000  ceFAZolin (ANCEF) IVPB 2g/100 mL premix  Status:  Discontinued     2 g 200 mL/hr over 30 Minutes Intravenous  Once 08/13/19 0951 08/13/19 1258   08/13/19 0941  ceFAZolin (ANCEF) 2-4 GM/100ML-% IVPB    Note to Pharmacy: Henrine Screws   : cabinet override      08/13/19 0941 08/13/19 2144       (indicate start date, and stop date if known)  HPI/Subjective: No acute distress. Underwent vascular procedure yesterday. Planned for HD today  Objective: Vitals:   08/15/19 1235 08/15/19 1316  BP: 140/80 129/70  Pulse: 62 69  Resp: 20 18  Temp:  98.2 F (36.8 C)  SpO2: 97% 96%    Intake/Output Summary (Last 24 hours) at 08/16/2019 0819 Last data filed at 08/15/2019 1700 Gross per 24 hour  Intake 740 ml  Output -  Net 740 ml   Filed Weights   08/14/19 0300 08/14/19 2100 08/15/19 0040  Weight: 119 kg 120.7 kg 118.7 kg    Exam:   General:  No distress   Cardiovascular: s1,s2 rrr  Respiratory: CTA BL  Abdomen: soft, nt   Musculoskeletal: no edema in legs    Data Reviewed: Basic Metabolic Panel: Recent Labs  Lab 08/12/19 1708 08/13/19 0429 08/14/19 2051  NA 132* 132* 133*  K 4.8 5.0 5.1  CL 89* 94* 94*  CO2 26 24 23   GLUCOSE 262* 344* 123*  BUN 59* 69* 91*  CREATININE 14.13* 15.25* 18.16*  CALCIUM 9.6 9.2 9.3  PHOS  --  3.7 4.0   Liver Function Tests: Recent Labs  Lab 08/13/19 0429 08/14/19 2051  ALBUMIN 3.1* 3.1*   No results for input(s): LIPASE, AMYLASE  in the last 168 hours. No results for input(s): AMMONIA in the last 168 hours. CBC: Recent Labs  Lab 08/12/19 1708 08/14/19 2051  WBC 7.9 7.5  HGB 15.5 13.4  HCT 48.0 40.8  MCV 99.0 97.4  PLT 180 162   Cardiac Enzymes: No results for input(s): CKTOTAL, CKMB, CKMBINDEX, TROPONINI in the last 168 hours. BNP (last 3 results) No results for input(s): BNP in the last 8760 hours.  ProBNP (last 3 results) No results for input(s): PROBNP in the last 8760 hours.  CBG: Recent Labs  Lab 08/15/19 0719 08/15/19 1102 08/15/19 1611 08/15/19 2219 08/16/19 0716  GLUCAP 137* 90 163* 180* 229*    Recent Results (from the past 240 hour(s))  SARS CORONAVIRUS 2 (TAT 6-24 HRS) Nasopharyngeal Nasopharyngeal Swab     Status: None   Collection Time: 08/12/19  8:51 PM   Specimen: Nasopharyngeal Swab  Result Value Ref Range Status   SARS Coronavirus 2 NEGATIVE NEGATIVE Final    Comment: (NOTE) SARS-CoV-2 target nucleic acids are NOT DETECTED. The SARS-CoV-2 RNA is generally detectable in upper and lower respiratory specimens during the acute phase of infection. Negative results do not preclude SARS-CoV-2 infection, do not rule out co-infections with other pathogens, and should not be used as the sole basis for treatment or other patient management decisions. Negative results must be combined with clinical observations, patient history, and epidemiological information. The expected result is Negative. Fact Sheet for Patients: SugarRoll.be Fact Sheet for Healthcare Providers: https://www.woods-mathews.com/ This test is not yet approved or cleared by the [place] FDA and  has been authorized for detection and/or diagnosis of SARS-CoV-2 by FDA under an Emergency Use Authorization (EUA). This EUA will remain  in effect (meaning this test can be used) for the duration of the COVID-19 declaration under Section 56 4(b)(1) of the Act, 21  U.S.C. section 360bbb-3(b)(1), unless the authorization is terminated or revoked sooner. Performed at [facility], [place] [address] [place], [place] [address]   MRSA PCR Screening     Status: None   Collection Time: 08/13/19  3:00 AM   Specimen: Nasal Mucosa; Nasopharyngeal  Result Value Ref Range Status   MRSA by PCR NEGATIVE NEGATIVE Final    Comment:        The GeneXpert MRSA Assay (FDA approved for NASAL specimens only), is one component of a comprehensive MRSA colonization surveillance program. It is not intended to diagnose MRSA infection nor to guide or monitor treatment for MRSA infections. Performed at [facility], [place] [address], [place], [place] [address]      Studies: No results found.  Scheduled Meds: . amLODipine  10 mg Oral Daily  . atorvastatin  40 mg Oral QPM  . carvedilol  25 mg Oral BID  . Chlorhexidine Gluconate Cloth  6 each Topical Q0600  . Chlorhexidine Gluconate Cloth  6 each Topical Q0600  . famotidine  20 mg Oral QHS  . insulin aspart  0-5 Units Subcutaneous QHS  . insulin aspart  0-9 Units Subcutaneous TID WC  . insulin aspart protamine- aspart  15 Units Subcutaneous  BID WC  . pantoprazole  20 mg Oral Daily  . sacubitril-valsartan  1 tablet Oral BID  . sucroferric oxyhydroxide  1,000 mg Oral TID WC   Continuous Infusions: . sodium chloride 10 mL/hr at 08/13/19 [id number]    Principal Problem:   Dialysis AV fistula malfunction (HCC) Active Problems:   ESRD (end stage renal disease) ([place])   DM2 (diabetes mellitus, type 2) (HCC)   HTN (hypertension)   LBBB (left bundle branch block)   Problem with vascular access   Chronic systolic CHF (congestive heart failure) ([place])    Time spent: >25 minutes     Kinnie Feil  [facility] Pager [phone]. If 7PM-7AM, please contact night-coverage at [email or web address], password [facility] 08/16/2019, 8:19 AM  LOS: 3 days

## 2019-08-16 NOTE — Progress Notes (Signed)
Patient ID: [name] Cruel Sr., male   DOB: [birth year], 58 y.o.   MRN: [id number] Comfortable this morning.  Up eating breakfast.  He has a thrill in his left upper arm AV fistula.  Surgical incision healing  The patient had angioplasty of stenosis at the thoracic outlet with Dr. Donzetta Matters yesterday.  He does have a left IJ tunneled catheter that I placed on Sunday.  I would recommend use of his left arm AV fistula.  I would use this for 2 weeks.  If he is having successful dialysis would discontinue the catheter.  If he is having issues with flow in his Mega fistula with some proximal stenosis, would use the catheter and consider right arm access.  He does report that he had what sounds like a failed right radiocephalic fistula and then had a brachiocephalic fistula which had complications probably with MRSA infection as he describes.  These records are unavailable.  He reports that this was done sometime ago.  Would recommend right arm venogram prior to right arm access.  It is possible that he has right central vein occlusion and that his difficulty on the right with swelling was related to outflow occlusion rather than infection.  If he is having successful use of his left upper arm fistula, no further treatment would be required until this fails.  Okay for discharge from vascular standpoint.  Please notify us if we can assist

## 2019-08-16 NOTE — Discharge Summary (Signed)
Physician Discharge Summary  [name] Cruel Sr. [id number] DOB: 20-Aug-1961 DOA: 08/12/2019  PCP: Patient, No Pcp Per  Admit date: 08/12/2019 Discharge date: 08/16/2019  Time spent: >25 minutes  Recommendations for Outpatient Follow-up:  HD as scheduled Follow up with cardiology in 7-10 days PCP in 3-7 days as needed  Discharge Diagnoses:  Principal Problem:   Dialysis AV fistula malfunction ([place]) Active Problems:   ESRD (end stage renal disease) ([place])   DM2 (diabetes mellitus, type 2) ([place])   HTN (hypertension)   LBBB (left bundle branch block)   Problem with vascular access   Chronic systolic CHF (congestive heart failure) ([place])   Discharge Condition: stable   Diet recommendation: renal. Carb modified   Filed Weights   08/14/19 0300 08/14/19 2100 08/15/19 0040  Weight: 119 kg 120.7 kg 118.7 kg    History of present illness:   58 y.o.malewith medical history significant ofESRD on HD MWF, hypertension, nonischemic cardiomyopathy, type 2 diabetes presenting to the hospital for evaluation of left upper extremity AV fistula malfunction and shortness of breath.Patient states he went for dialysis and he started him 7 times but his AV fistula was not working. They were not able to give him dialysis. States he is feeling short of breath because he hasfluid on him and wants dialysis.Denies f/c, cp, n/v/d, abd pain. Nephrology and vascular surgery were consulted to assist. Pt underwent thrombectomy of LUE AVF and placement of L-IJ tunnel HD catheter on 08/13/19.  Hospital Course:   Left upper arm AV fistula malfunction ESRD on HD MWF -Patient was unable to get dialysison Fridaydue to AV fistula malfunction/thrombosis -s/p thrombectomy of left upper arm AV fistula, #2 left IJ tunneled hemodialysis catheteron 08/13/19 -s/p Left arm shuntogram.Balloon angioplasty of left subclavian vein and left cephalic arch with 8, 10, 12 mm balloons -cont HD per nephrology. Cont  outpatient vascular surgery follow up   Left bundle branch block -EKG with left bundle branch block and cardiology was consultedin the ED. High-sensitivity troponin 53>49. -ACS ruled out and cardiology is not recommending ischemicwork-up. -Continue home Coreg, Entresto -has cardiology follow up at [place] -Nonischemic CM-via cardiac catheterization, LVEF-35% most likely etiology -NYHA class II symptoms, no HF exacerbations no HF hospitalizations Hypertension -Continue home amlodipine, Coreg, Entresto -continue Entresto, coreg, atorvastatin  Uncontrolled DM2, with hyperglycemia -08/12/19 A1C--9.9 -restarted 70/30 insulin   GERD -Continue home PPI and H2 blocker  obesity Body mass index is 33.64 kg/m.  Procedures:  Vascular surgery balloon angioplasty    (i.e. Studies not automatically included, echos, thoracentesis, etc; not x-rays)  Consultations:  Vascular   HD  Discharge Exam: Vitals:   08/15/19 1316 08/16/19 0906  BP: 129/70 (!) 146/79  Pulse: 69 74  Resp: 18 18  Temp: 98.2 F (36.8 C) 98.3 F (36.8 C)  SpO2: 96% 96%    General: no distress  Cardiovascular: s1,s2 rrr Respiratory: CTA BL  Discharge Instructions  Discharge Instructions    Diet - low sodium heart healthy   Complete by: As directed    Increase activity slowly   Complete by: As directed      Allergies as of 08/16/2019   No Known Allergies     Medication List    TAKE these medications   amLODipine 10 MG tablet Commonly known as: NORVASC Take 10 mg by mouth daily.   aspirin 81 MG chewable tablet Chew 81 mg by mouth daily.   atorvastatin 40 MG tablet Commonly known as: LIPITOR Take 40 mg by  mouth every evening.   bisacodyl 5 MG EC tablet Commonly known as: DULCOLAX Take 1 tablet (5 mg total) by mouth at bedtime as needed for mild constipation or moderate constipation.   carvedilol 25 MG tablet Commonly known as: COREG Take 25 mg by  mouth 2 (two) times daily.   Entresto 24-26 MG Generic drug: sacubitril-valsartan Take 1 tablet by mouth 2 (two) times daily.   famotidine 20 MG tablet Commonly known as: PEPCID Take 1 tablet (20 mg total) by mouth at bedtime. What changed: when to take this   HumuLIN 70/30 (70-30) 100 UNIT/ML injection Generic drug: insulin NPH-regular Human Inject 6-20 Units into the skin See admin instructions. Inject 20 units into the skin in the morning and 6 units in the evening   lidocaine-prilocaine cream Commonly known as: EMLA Apply 1 application topically every Monday, Wednesday, and Friday with hemodialysis.   omeprazole 20 MG capsule Commonly known as: PRILOSEC Take 20 mg by mouth daily before breakfast.   sildenafil 100 MG tablet Commonly known as: VIAGRA Take 100 mg by mouth daily as needed for erectile dysfunction.   Velphoro 500 MG chewable tablet Generic drug: sucroferric oxyhydroxide Chew 1,000 mg by mouth 3 (three) times daily with meals.      No Known Allergies    The results of significant diagnostics from this hospitalization (including imaging, microbiology, ancillary and laboratory) are listed below for reference.    Significant Diagnostic Studies: Ct Abdomen Pelvis Wo Contrast  Result Date: 08/05/2019 CLINICAL DATA:  Syncope with abdominal pain EXAM: CT ABDOMEN AND PELVIS WITHOUT CONTRAST TECHNIQUE: Multidetector CT imaging of the abdomen and pelvis was performed following the standard protocol without IV contrast. COMPARISON:  09/23/2018 FINDINGS: Lower chest: Lung bases demonstrate no acute consolidation or effusion. Heart size is normal. Coronary vascular calcification. Small amount of left gynecomastia. Hepatobiliary: No focal liver abnormality is seen. No gallstones, gallbladder wall thickening, or biliary dilatation. Pancreas: Unremarkable. No pancreatic ductal dilatation or surrounding inflammatory changes. Spleen: Normal in size without focal abnormality.  Adrenals/Urinary Tract: Adrenal glands are normal. Slightly atrophic kidneys with prominent intrarenal vascular calcification. No hydronephrosis. Possible punctate stone lower pole right kidney. The bladder is thick walled but nearly empty Stomach/Bowel: Stomach is within normal limits. Appendix appears normal. No evidence of bowel wall thickening, distention, or inflammatory changes. Vascular/Lymphatic: Moderate aortic atherosclerosis. No significantly enlarged lymph nodes. No aneurysm Reproductive: Prostate is unremarkable. Other: Negative for free air or free fluid Musculoskeletal: Grade 1 anterolisthesis L4 on L5 with chronic L4 pars defect. Degenerative changes. IMPRESSION: 1. No CT evidence for acute intra-abdominal or pelvic abnormality. 2. Slightly atrophic kidneys. Slightly thick-walled urinary bladder, likely due to under distension Electronically Signed   By: Donavan Foil M.D.   On: 08/05/2019 23:01   Dg Chest 1 View  Result Date: 08/13/2019 CLINICAL DATA:  Central line placement EXAM: CHEST  1 VIEW COMPARISON:  August 12, 2019 FINDINGS: The double lumen central line terminates just in the right side of the atrium, approximately 3 cm below the caval atrial junction. No pneumothorax. Cardiomediastinal silhouette is stable. The lungs remain clear. IMPRESSION: The new left central line terminates in the right side of the atrium 3 cm below the caval atrial junction. No pneumothorax. No other change. Electronically Signed   By: Dorise Bullion III M.D   On: 08/13/2019 13:59   Dg Chest 2 View  Result Date: 08/12/2019 CLINICAL DATA:  Shortness of breath EXAM: CHEST - 2 VIEW COMPARISON:  08/05/2019 FINDINGS: Heart and  mediastinal contours are within normal limits. No focal opacities or effusions. No acute bony abnormality. IMPRESSION: No active cardiopulmonary disease. Electronically Signed   By: Rolm Baptise M.D.   On: 08/12/2019 17:32   Dg Chest 2 View  Result Date: 08/05/2019 CLINICAL DATA:   Nausea vomiting and abdominal pain EXAM: CHEST - 2 VIEW COMPARISON:  09/20/2018 FINDINGS: The heart size and mediastinal contours are within normal limits. Both lungs are clear. Mild degenerative changes of the spine. IMPRESSION: No active cardiopulmonary disease. Electronically Signed   By: Donavan Foil M.D.   On: 08/05/2019 22:41   Ct Head Wo Contrast  Result Date: 08/05/2019 CLINICAL DATA:  Syncope EXAM: CT HEAD WITHOUT CONTRAST TECHNIQUE: Contiguous axial images were obtained from the base of the skull through the vertex without intravenous contrast. COMPARISON:  Report 02/06/2008 FINDINGS: Brain: No acute territorial infarction, hemorrhage or intracranial mass. Chronic appearing lacunar infarcts in the bilateral caudate. Age indeterminate hypodensity in the right thalamus and left basal ganglia. Mild small vessel ischemic change of the white matter. Normal ventricle size. Vascular: No hyperdense vessels.  Carotid vascular calcification Skull: Normal. Negative for fracture or focal lesion. Sinuses/Orbits: No acute finding. Other: None IMPRESSION: 1. Negative for acute intracranial hemorrhage or mass lesion 2. Suspected chronic lacunar infarcts in the bilateral caudate with age indeterminate hypodensity/lacunar infarcts in the right thalamus and left basal ganglia. 3. Mild small vessel ischemic changes of the white matter Electronically Signed   By: Donavan Foil M.D.   On: 08/05/2019 22:56   Dg C-arm 1-60 Min-no Report  Result Date: 08/13/2019 Fluoroscopy was utilized by the requesting physician.  No radiographic interpretation.    Microbiology: Recent Results (from the past 240 hour(s))  SARS CORONAVIRUS 2 (TAT 6-24 HRS) Nasopharyngeal Nasopharyngeal Swab     Status: None   Collection Time: 08/12/19  8:51 PM   Specimen: Nasopharyngeal Swab  Result Value Ref Range Status   SARS Coronavirus 2 NEGATIVE NEGATIVE Final    Comment: (NOTE) SARS-CoV-2 target nucleic acids are NOT DETECTED. The  SARS-CoV-2 RNA is generally detectable in upper and lower respiratory specimens during the acute phase of infection. Negative results do not preclude SARS-CoV-2 infection, do not rule out co-infections with other pathogens, and should not be used as the sole basis for treatment or other patient management decisions. Negative results must be combined with clinical observations, patient history, and epidemiological information. The expected result is Negative. Fact Sheet for Patients: SugarRoll.be Fact Sheet for Healthcare Providers: https://www.woods-mathews.com/ This test is not yet approved or cleared by the [place] FDA and  has been authorized for detection and/or diagnosis of SARS-CoV-2 by FDA under an Emergency Use Authorization (EUA). This EUA will remain  in effect (meaning this test can be used) for the duration of the COVID-19 declaration under Section 56 4(b)(1) of the Act, 21 U.S.C. section 360bbb-3(b)(1), unless the authorization is terminated or revoked sooner. Performed at [facility], [place] [address] [place], [place] [address]   MRSA PCR Screening     Status: None   Collection Time: 08/13/19  3:00 AM   Specimen: Nasal Mucosa; Nasopharyngeal  Result Value Ref Range Status   MRSA by PCR NEGATIVE NEGATIVE Final    Comment:        The GeneXpert MRSA Assay (FDA approved for NASAL specimens only), is one component of a comprehensive MRSA colonization surveillance program. It is not intended to diagnose MRSA infection nor to guide or monitor treatment for MRSA infections. Performed  at [facility], [place] [address] [place], [place] [address]      Labs: Basic Metabolic Panel: Recent Labs  Lab 08/12/19 1708 08/13/19 0429 08/14/19 2051  NA 132* 132* 133*  K 4.8 5.0 5.1  CL 89* 94* 94*  CO2 26 24 23   GLUCOSE 262* 344* 123*  BUN 59* 69* 91*  CREATININE 14.13* 15.25* 18.16*  CALCIUM 9.6 9.2 9.3  PHOS   --  3.7 4.0   Liver Function Tests: Recent Labs  Lab 08/13/19 0429 08/14/19 2051  ALBUMIN 3.1* 3.1*   No results for input(s): LIPASE, AMYLASE in the last 168 hours. No results for input(s): AMMONIA in the last 168 hours. CBC: Recent Labs  Lab 08/12/19 1708 08/14/19 2051  WBC 7.9 7.5  HGB 15.5 13.4  HCT 48.0 40.8  MCV 99.0 97.4  PLT 180 162   Cardiac Enzymes: No results for input(s): CKTOTAL, CKMB, CKMBINDEX, TROPONINI in the last 168 hours. BNP: BNP (last 3 results) No results for input(s): BNP in the last 8760 hours.  ProBNP (last 3 results) No results for input(s): PROBNP in the last 8760 hours.  CBG: Recent Labs  Lab 08/15/19 0719 08/15/19 1102 08/15/19 1611 08/15/19 2219 08/16/19 0716  GLUCAP 137* 90 163* 180* 229*       Signed:  Rowe Clack N  [facility] 08/16/2019, 9:38 AM

## 2019-08-16 NOTE — Progress Notes (Signed)
[facility] Progress Note   Subjective:   S/p fistulogram with angioplasty yesterday. For discharge today and will have HD at outpatient unit.   Objective Vitals:   08/15/19 1231 08/15/19 1235 08/15/19 1316 08/16/19 0906  BP: 137/87 140/80 129/70 (!) 146/79  Pulse: 65 62 69 74  Resp: 15 20 18 18   Temp:   98.2 F (36.8 C) 98.3 F (36.8 C)  TempSrc:   Oral Oral  SpO2: 99% 97% 96% 96%  Weight:      Height:       Physical Exam General: Well developed, alert male in NAD Heart: RRR, no murmurs rubs or fallops Lungs: CTA bilaterally without wheezing, rhonchi or rales Abdomen: Soft, non-tender, non-distended. +BS Extremities:  LUE swollen. No edema b/l lower extremities Dialysis Access:  New L IJ TDC  In place; LUE AVF + bruit,   Additional Objective Labs: Basic Metabolic Panel: Recent Labs  Lab 08/12/19 1708 08/13/19 0429 08/14/19 2051  NA 132* 132* 133*  K 4.8 5.0 5.1  CL 89* 94* 94*  CO2 26 24 23   GLUCOSE 262* 344* 123*  BUN 59* 69* 91*  CREATININE 14.13* 15.25* 18.16*  CALCIUM 9.6 9.2 9.3  PHOS  --  3.7 4.0   Liver Function Tests: Recent Labs  Lab 08/13/19 0429 08/14/19 2051  ALBUMIN 3.1* 3.1*   CBC: Recent Labs  Lab 08/12/19 1708 08/14/19 2051  WBC 7.9 7.5  HGB 15.5 13.4  HCT 48.0 40.8  MCV 99.0 97.4  PLT 180 162   Blood Culture    Component Value Date/Time   SDES BLOOD DRAWN BY DIALYSIS 09/22/2018 1159   SPECREQUEST  09/22/2018 1159    BOTTLES DRAWN AEROBIC AND ANAEROBIC Blood Culture adequate volume   CULT  09/22/2018 1159    NO GROWTH 7 DAYS Performed at [facility], [address], [place], [place] [address]    REPTSTATUS 09/29/2018 FINAL 09/22/2018 1159    CBG: Recent Labs  Lab 08/15/19 0719 08/15/19 1102 08/15/19 1611 08/15/19 2219 08/16/19 0716  GLUCAP 137* 90 163* 180* 229*    Studies/Results: No results found. Medications: . sodium chloride 10 mL/hr at 08/13/19 0959   . amLODipine  10 mg Oral Daily  .  atorvastatin  40 mg Oral QPM  . carvedilol  25 mg Oral BID  . Chlorhexidine Gluconate Cloth  6 each Topical Q0600  . Chlorhexidine Gluconate Cloth  6 each Topical Q0600  . famotidine  20 mg Oral QHS  . insulin aspart  0-5 Units Subcutaneous QHS  . insulin aspart  0-9 Units Subcutaneous TID WC  . insulin aspart protamine- aspart  15 Units Subcutaneous BID WC  . pantoprazole  20 mg Oral Daily  . sacubitril-valsartan  1 tablet Oral BID  . sucroferric oxyhydroxide  1,000 mg Oral TID WC    Dialysis Orders: [facility] MWF 4hr 33min 180NRe EDW 113kg 3K/2.5Ca  Parsabiv 2.14mmg TIW Calcitriol 1.25 mcg PO TIW Heparin 6000 U bolus   Assessment/Plan: 1. Thrombosed fistula: Unable to dialyze on 9/25. Thrombectomy of LUE AVF and L IJ TDC completed on 9/26  by Dr. Donnetta Hutching.  Fistulogram 9/28 with Dr. Donzetta Matters  Tight stenosis at throacic outlet s/p angioplasty. Ok to use AVF.  2. ESRD: HD MWF;  Off schedule due to access issues. Had HD Sunday night. Fistulogram yesterday. Plans for HD today off schedule at outpatient unit, then will resume regular schedule.  3. HTN/volume:  Net UF 2L on 9/27 Still 5kg over EDW by weights. Push  to goal  4. Anemia: No ESA indicated.  5. Secondary hyperparathyroidism: Parsabiv not on hospital formulary- resume as an outpatient. Continue calcitriol and binders. 6. Nutrition:  Renal diet with fluid restrictions.  7. Left BBB: ACS ruled out. No further w/u this admission. Continue home meds.  8. NICM  9. T2DM:  insulin per primary team.   Lynnda Child PA-C [place] Pager [phone] 08/16/2019,9:31 AM

## 2019-08-16 NOTE — Progress Notes (Signed)
DISCHARGE NOTE HOME [name] BOLTZ Sr. to be discharged Home per MD order. Discussed prescriptions and follow up appointments with the patient. Prescriptions given to patient; medication list explained in detail. Patient verbalized understanding.  Skin clean, dry and intact without evidence of skin break down, no evidence of skin tears noted. IV catheter discontinued intact. Site without signs and symptoms of complications. Dressing and pressure applied. Pt denies pain at the site currently. No complaints noted.  Patient free of lines, drains, and wounds.   An After Visit Summary (AVS) was printed and given to the patient. Patient escorted via wheelchair, and discharged home via private auto.  [name] Fore, RN

## 2019-09-08 ENCOUNTER — Encounter: Payer: Self-pay | Admitting: Family

## 2019-09-08 ENCOUNTER — Ambulatory Visit (INDEPENDENT_AMBULATORY_CARE_PROVIDER_SITE_OTHER): Payer: Medicare Other | Admitting: Family

## 2019-09-08 ENCOUNTER — Other Ambulatory Visit: Payer: Self-pay

## 2019-09-08 VITALS — BP 155/91 | HR 76 | Temp 96.9°F | Resp 18 | Ht 73.0 in | Wt 259.0 lb

## 2019-09-08 DIAGNOSIS — Z992 Dependence on renal dialysis: Secondary | ICD-10-CM

## 2019-09-08 DIAGNOSIS — T82590D Other mechanical complication of surgically created arteriovenous fistula, subsequent encounter: Secondary | ICD-10-CM

## 2019-09-08 DIAGNOSIS — N186 End stage renal disease: Secondary | ICD-10-CM

## 2019-09-08 NOTE — Progress Notes (Signed)
CC: able to cannulate more distal portion of AVF, not proximal  History of Present Illness  [name] SEMMLER Sr. is a 58 y.o. (June 30, 1961) male who is s/p Left arm shuntogram with balloon angioplasty of left subclavian vein and left cephalic arch with 8, 10, 12 mm balloons on 08-15-19 by Dr. Donzetta Matters. Two days prior to this he underwent left upper arm AV fistula thrombectomy.  Findings: Has a negative fistula in the left arm with pulsatility.  There is a tight stenosis at the level of the thoracic outlet balloon with a 1012 mm balloons.  Stenosis still exist however reflux is improved at completion.  Cephalic arch also has stenosis approximately 80% resolved to less than 30% at completion after balloon angioplasty.   He returns today as he states that the upper aspect of his AVF has no blood return when cannulated; however, he is able to adequately dialyze via the lower aspect of his AVF, 2 needles sites.   He dialyzes on MWF via left upper arm AVF at [facility].   He denies any steal type symptoms in his left upper extremity.   He is both hands dominant.    Previous access procedures have been completed in the right forearm and right upper arm. He states that he had MRSA in his right arm in the past. He states he was told that his right upper arm may be used again for an access.     Past Medical History:  Diagnosis Date  . Diabetes mellitus without complication ([place])   . ESRD on hemodialysis ([place])   . Hypertension   . Kidney failure   . Nonischemic cardiomyopathy [facility])     Social History Social History   Tobacco Use  . Smoking status: Never Smoker  . Smokeless tobacco: Never Used  Substance Use Topics  . Alcohol use: Not Currently  . Drug use: Never    Family History Family History  Problem Relation Age of Onset  . Hypertension Mother     Surgical History Past Surgical History:  Procedure Laterality Date  . A/V FISTULAGRAM N/A 08/15/2019   Procedure: A/V FISTULAGRAM;   Surgeon: [name], MD;  Location: [place] [facility];  Service: Cardiovascular;  Laterality: N/A;  . AV FISTULA PLACEMENT    . INSERTION OF DIALYSIS CATHETER  08/13/2019   Procedure: Insertion Of Dialysis Catheter;  Surgeon: [name], MD;  Location: [facility] OR;  Service: Vascular;;  . IRRIGATION AND DEBRIDEMENT ABSCESS    . PERIPHERAL VASCULAR BALLOON ANGIOPLASTY  08/15/2019   Procedure: PERIPHERAL VASCULAR BALLOON ANGIOPLASTY;  Surgeon: [name], MD;  Location: [place] [facility];  Service: Cardiovascular;;  . THROMBECTOMY AND REVISION OF ARTERIOVENTOUS (AV) GORETEX  GRAFT Left 08/13/2019   Procedure: THROMBECTOMY OF LEFT ARM ARTERIOVENTOUS (AV) FISTULA;  Surgeon: [name], MD;  Location: [place];  Service: Vascular;  Laterality: Left;  . TOE AMPUTATION      No Known Allergies  Current Outpatient Medications  Medication Sig Dispense Refill  . amLODipine (NORVASC) 10 MG tablet Take 10 mg by mouth daily.    Marland Kitchen aspirin 81 MG chewable tablet Chew 81 mg by mouth daily.  1  . atorvastatin (LIPITOR) 40 MG tablet Take 40 mg by mouth every evening.   2  . bisacodyl (DULCOLAX) 5 MG EC tablet Take 1 tablet (5 mg total) by mouth at bedtime as needed for mild constipation or moderate constipation. 30 tablet 0  . carvedilol (COREG) 25  MG tablet Take 25 mg by mouth 2 (two) times daily.  3  . ENTRESTO 24-26 MG Take 1 tablet by mouth 2 (two) times daily.  3  . famotidine (PEPCID) 20 MG tablet Take 1 tablet (20 mg total) by mouth at bedtime.    Marland Kitchen HUMULIN 70/30 (70-30) 100 UNIT/ML injection Inject 6-20 Units into the skin See admin instructions. Inject 20 units into the skin in the morning and 6 units in the evening  12  . lidocaine-prilocaine (EMLA) cream Apply 1 application topically every Monday, Wednesday, and Friday with hemodialysis.   3  . omeprazole (PRILOSEC) 20 MG capsule Take 20 mg by mouth daily before breakfast.    . sildenafil (VIAGRA) 100 MG tablet Take 100 mg  by mouth daily as needed for erectile dysfunction.   0  . sucroferric oxyhydroxide (VELPHORO) 500 MG chewable tablet Chew 1,000 mg by mouth 3 (three) times daily with meals.     No current facility-administered medications for this visit.      REVIEW OF SYSTEMS: see HPI for pertinent positives and negatives    PHYSICAL EXAMINATION:  Vitals:   09/08/19 1412  BP: (!) 155/91  Pulse: 76  Resp: 18  Temp: (!) 96.9 F (36.1 C)  TempSrc: Temporal  SpO2: 100%  Weight: 259 lb (117.5 kg)  Height: 6\' 1"  (1.854 m)   Body mass index is 34.17 kg/m.  General: Obese male in NAD, normal gait   HEENT:  No gross abnormalities Pulmonary: Respirations are non-labored, CTAB, good air movement in all fields.  Abdomen: Soft and non-tender with normal bowel sounds.  Musculoskeletal: There are no major deformities.   Neurologic: No focal weakness or paresthesias are detected, bilateral hand grip strength is 5/5.  Skin: There are no ulcer or rashes noted. Psychiatric: The patient has normal affect. Cardiovascular: There is a regular rate and rhythm without significant murmur appreciated.  Left upper arm AVF with palpable thrill and audible bruit at the distal portion, no thrill palpated nor bruit heard in the proximal portion of the AVF.  Left radial pulse is 1+ palpable, right is 2+ palpable   Medical Decision Making  [name] Mally. is a 58 y.o. male who presents for evaluation of left upper arm AVF. Pt states there is no blood return from the proximal aneurysmal section of his AVF when cannulated. He is able to dialyze the full time with two needles in the more distal section of his left upper arm AVF. He has no steal symptoms.  He is is s/p Left arm shuntogram with balloon angioplasty of left subclavian vein and left cephalic arch with 8, 10, 12 mm balloons on 08-15-19 by Dr. Donzetta Matters. Two days prior to this he underwent left upper arm AV fistula thrombectomy.   He has had a right forearm and  right upper arm AVF, states he has had MRSA in his right arm.  I discussed the above with Dr. Oneida Alar. Pt is not a candidate for an access in his right arm at this point since he had MRSA in his right arm. The only option to address any difficulty with his current left upper arm AVF would be an access in his thigh. I discussed this with pt, and he states he will continue to use his left upper arm AVF for hemodialysis as long as it works well enough.  Follow up as needed.    Clemon Chambers, RN, MSN, FNP-C [facility] Office: [phone]  09/08/2019,  2:28 PM  Clinic MD: Laqueta Due

## 2019-12-15 ENCOUNTER — Ambulatory Visit: Payer: Medicare Other | Admitting: Vascular Surgery

## 2020-03-27 ENCOUNTER — Other Ambulatory Visit
Admission: RE | Admit: 2020-03-27 | Discharge: 2020-03-27 | Disposition: A | Discharge status: Home / Self Care | Payer: Medicare Other | Source: Ambulatory Visit | Attending: Ophthalmology | Admitting: Ophthalmology

## 2020-03-27 ENCOUNTER — Encounter: Payer: Self-pay | Admitting: Ophthalmology

## 2020-03-27 DIAGNOSIS — Z20822 Contact with and (suspected) exposure to covid-19: Secondary | ICD-10-CM | POA: Diagnosis not present

## 2020-03-27 DIAGNOSIS — Z01812 Encounter for preprocedural laboratory examination: Secondary | ICD-10-CM | POA: Diagnosis present

## 2020-03-28 LAB — SARS CORONAVIRUS 2 (TAT 6-24 HRS): SARS Coronavirus 2: NEGATIVE

## 2020-03-29 ENCOUNTER — Ambulatory Visit: Payer: Medicare Other | Admitting: Anesthesiology

## 2020-03-29 ENCOUNTER — Encounter
Admission: RE | Disposition: A | Discharge status: Home / Self Care | Payer: Self-pay | Source: Home / Self Care | Attending: Ophthalmology

## 2020-03-29 ENCOUNTER — Ambulatory Visit
Admission: RE | Admit: 2020-03-29 | Discharge: 2020-03-29 | Disposition: A | Discharge status: Home / Self Care | Payer: Medicare Other | Attending: Ophthalmology | Admitting: Ophthalmology

## 2020-03-29 ENCOUNTER — Encounter: Payer: Self-pay | Admitting: Ophthalmology

## 2020-03-29 ENCOUNTER — Other Ambulatory Visit: Payer: Self-pay

## 2020-03-29 DIAGNOSIS — H2511 Age-related nuclear cataract, right eye: Secondary | ICD-10-CM | POA: Diagnosis present

## 2020-03-29 DIAGNOSIS — Z89422 Acquired absence of other left toe(s): Secondary | ICD-10-CM | POA: Insufficient documentation

## 2020-03-29 DIAGNOSIS — I428 Other cardiomyopathies: Secondary | ICD-10-CM | POA: Diagnosis not present

## 2020-03-29 DIAGNOSIS — I509 Heart failure, unspecified: Secondary | ICD-10-CM | POA: Diagnosis not present

## 2020-03-29 DIAGNOSIS — I252 Old myocardial infarction: Secondary | ICD-10-CM | POA: Insufficient documentation

## 2020-03-29 DIAGNOSIS — Z89421 Acquired absence of other right toe(s): Secondary | ICD-10-CM | POA: Diagnosis not present

## 2020-03-29 DIAGNOSIS — E1122 Type 2 diabetes mellitus with diabetic chronic kidney disease: Secondary | ICD-10-CM | POA: Diagnosis not present

## 2020-03-29 DIAGNOSIS — I132 Hypertensive heart and chronic kidney disease with heart failure and with stage 5 chronic kidney disease, or end stage renal disease: Secondary | ICD-10-CM | POA: Insufficient documentation

## 2020-03-29 DIAGNOSIS — E1136 Type 2 diabetes mellitus with diabetic cataract: Secondary | ICD-10-CM | POA: Insufficient documentation

## 2020-03-29 DIAGNOSIS — Z992 Dependence on renal dialysis: Secondary | ICD-10-CM | POA: Diagnosis not present

## 2020-03-29 DIAGNOSIS — N186 End stage renal disease: Secondary | ICD-10-CM | POA: Insufficient documentation

## 2020-03-29 HISTORY — PX: CATARACT EXTRACTION W/PHACO: SHX586

## 2020-03-29 HISTORY — DX: Heart failure, unspecified: I50.9

## 2020-03-29 HISTORY — DX: Acute myocardial infarction, unspecified: I21.9

## 2020-03-29 LAB — POTASSIUM: Potassium: 3.8 mmol/L (ref 3.5–5.1)

## 2020-03-29 LAB — GLUCOSE, CAPILLARY
Glucose-Capillary: 233 mg/dL — ABNORMAL HIGH (ref 70–99)
Glucose-Capillary: 240 mg/dL — ABNORMAL HIGH (ref 70–99)

## 2020-03-29 SURGERY — PHACOEMULSIFICATION, CATARACT, WITH IOL INSERTION
Anesthesia: Monitor Anesthesia Care | Site: Eye | Laterality: Right

## 2020-03-29 MED ORDER — LIDOCAINE HCL (PF) 4 % IJ SOLN
INTRAOCULAR | Status: DC | PRN
  Administered 2020-03-29: 2 mL

## 2020-03-29 MED ORDER — ARMC OPHTHALMIC DILATING DROPS
OPHTHALMIC | Status: AC
  Filled 2020-03-29: qty 0.5

## 2020-03-29 MED ORDER — MOXIFLOXACIN HCL 0.5 % OP SOLN
1.0000 [drp] | OPHTHALMIC | Status: DC
  Administered 2020-03-29: 1 [drp] via OPHTHALMIC

## 2020-03-29 MED ORDER — EPINEPHRINE PF 1 MG/ML IJ SOLN
INTRAOCULAR | Status: DC | PRN
  Administered 2020-03-29: 200 mL via OPHTHALMIC

## 2020-03-29 MED ORDER — MOXIFLOXACIN HCL 0.5 % OP SOLN
OPHTHALMIC | Status: AC
  Filled 2020-03-29: qty 6

## 2020-03-29 MED ORDER — MIDAZOLAM HCL 2 MG/2ML IJ SOLN
INTRAMUSCULAR | Status: AC
  Filled 2020-03-29: qty 2

## 2020-03-29 MED ORDER — FENTANYL CITRATE (PF) 100 MCG/2ML IJ SOLN
INTRAMUSCULAR | Status: AC
  Filled 2020-03-29: qty 2

## 2020-03-29 MED ORDER — POVIDONE-IODINE 5 % OP SOLN
OPHTHALMIC | Status: DC | PRN
  Administered 2020-03-29: 1 via OPHTHALMIC

## 2020-03-29 MED ORDER — CARBACHOL 0.01 % IO SOLN
INTRAOCULAR | Status: DC | PRN
  Administered 2020-03-29: 0.5 mL via INTRAOCULAR

## 2020-03-29 MED ORDER — MOXIFLOXACIN HCL 0.5 % OP SOLN
1.0000 [drp] | Freq: Once | OPHTHALMIC | Status: AC
  Administered 2020-03-29: 1 [drp] via OPHTHALMIC

## 2020-03-29 MED ORDER — TETRACAINE HCL 0.5 % OP SOLN
OPHTHALMIC | Status: AC
  Administered 2020-03-29: 1 [drp] via OPHTHALMIC
  Filled 2020-03-29: qty 4

## 2020-03-29 MED ORDER — ARMC OPHTHALMIC DILATING DROPS
1.0000 "application " | OPHTHALMIC | Status: AC
  Administered 2020-03-29 (×3): 1 via OPHTHALMIC

## 2020-03-29 MED ORDER — MIDAZOLAM HCL 2 MG/2ML IJ SOLN
INTRAMUSCULAR | Status: DC | PRN
  Administered 2020-03-29 (×2): 1 mg via INTRAVENOUS

## 2020-03-29 MED ORDER — SODIUM CHLORIDE 0.9 % IV SOLN: INTRAVENOUS | Status: DC

## 2020-03-29 MED ORDER — TETRACAINE HCL 0.5 % OP SOLN
1.0000 [drp] | Freq: Once | OPHTHALMIC | Status: AC
  Administered 2020-03-29: 1 [drp] via OPHTHALMIC

## 2020-03-29 MED ORDER — NA CHONDROIT SULF-NA HYALURON 40-30 MG/ML IO SOLN
INTRAOCULAR | Status: DC | PRN
  Administered 2020-03-29: 0.5 mL via INTRAOCULAR

## 2020-03-29 MED ORDER — ONDANSETRON HCL 4 MG/2ML IJ SOLN
INTRAMUSCULAR | Status: DC | PRN
  Administered 2020-03-29: 4 mg via INTRAVENOUS

## 2020-03-29 MED ORDER — NEOMYCIN-POLYMYXIN-DEXAMETH 0.1 % OP OINT
TOPICAL_OINTMENT | OPHTHALMIC | Status: DC | PRN
  Administered 2020-03-29: 1 via OPHTHALMIC

## 2020-03-29 MED ORDER — FENTANYL CITRATE (PF) 100 MCG/2ML IJ SOLN
INTRAMUSCULAR | Status: DC | PRN
  Administered 2020-03-29: 25 ug via INTRAVENOUS

## 2020-03-29 SURGICAL SUPPLY — 19 items
GLOVE BIO SURGEON STRL SZ8 (GLOVE) ×3 IMPLANT
GLOVE BIOGEL M 6.5 STRL (GLOVE) ×3 IMPLANT
GLOVE SURG LX 7.5 STRW (GLOVE) ×2
GLOVE SURG LX STRL 7.5 STRW (GLOVE) ×1 IMPLANT
GOWN STRL REUS W/ TWL LRG LVL3 (GOWN DISPOSABLE) ×2 IMPLANT
GOWN STRL REUS W/TWL LRG LVL3 (GOWN DISPOSABLE) ×4
LABEL CATARACT MEDS ST (LABEL) ×3 IMPLANT
LENS IOL DIOP 20.5 (Intraocular Lens) ×3 IMPLANT
LENS IOL TECNIS MONO 20.5 (Intraocular Lens) IMPLANT
NDL HPO THNWL 1X22GA REG BVL (NEEDLE) ×1 IMPLANT
NEEDLE SAFETY 22GX1 (NEEDLE) ×3
PACK CATARACT (MISCELLANEOUS) ×3 IMPLANT
PACK CATARACT BRASINGTON LX (MISCELLANEOUS) ×3 IMPLANT
PACK EYE AFTER SURG (MISCELLANEOUS) ×3 IMPLANT
SOL BSS BAG (MISCELLANEOUS) ×3
SOLUTION BSS BAG (MISCELLANEOUS) ×1 IMPLANT
SYR 5ML LL (SYRINGE) ×3 IMPLANT
WATER STERILE IRR 250ML POUR (IV SOLUTION) ×3 IMPLANT
WIPE NON LINTING 3.25X3.25 (MISCELLANEOUS) ×3 IMPLANT

## 2020-03-29 NOTE — Op Note (Signed)
OPERATIVE NOTE  [name] HOLSINGER Sr. 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 Item Serial No. Manufacturer Lot No. LRB No. Used Action  LENS IOL DIOP 20.5 - [id number] Intraocular Lens LENS IOL DIOP 20.5 [id number] AMO  Right 1 Implanted        SURGEON:  Wyonia Hough, MD   ANESTHESIA:  Topical with tetracaine drops and 2% Xylocaine jelly, augmented with 1% preservative-free intracameral lidocaine.    COMPLICATIONS:  None.   DESCRIPTION OF PROCEDURE:  The patient was identified in the holding room and transported to the operating room and placed in the supine position under the operating microscope. Theright eye was identified as the operative eye and it was prepped and draped in the usual sterile ophthalmic fashion.   A 1 millimeter clear-corneal paracentesis was made at the 12:00 position.  0.5 ml of preservative-free 1% lidocaine was injected into the anterior chamber. The anterior chamber was filled with Viscoat viscoelastic.  A 2.4 millimeter keratome was used to make a near-clear corneal incision at the 9:00 position. A curvilinear capsulorrhexis was made with a cystotome and capsulorrhexis forceps.  Balanced salt solution was used to hydrodissect and hydrodelineate the nucleus.   Phacoemulsification was then used in stop and chop fashion to remove the lens nucleus and epinucleus.  The remaining cortex was then removed using the irrigation and aspiration handpiece. Provisc was then placed into the capsular bag to distend it for lens placement.  A  lens was then injected into the capsular bag.  The remaining viscoelastic was aspirated.  Wounds were hydrated with balanced salt solution.  The anterior chamber was inflated to a physiologic pressure with balanced salt solution. Vigamox 0.2 ml of a 1mg  per ml solution was injected into the anterior chamber for a dose of 0.2 mg of intracameral antibiotic at the completion of the case. Miostat was placed into the anterior chamber to constrict the pupil.  No wound leaks were noted.  Topical Vigamox drops and Maxitrol ointment were applied to the eye.  The patient was taken to the recovery room in stable condition without complications of anesthesia or surgery.  Odelia Graciano 03/29/2020, 8:50 AM

## 2020-03-29 NOTE — Discharge Instructions (Addendum)
Eye Surgery Discharge Instructions  Expect mild scratchy sensation or mild soreness. DO NOT RUB YOUR EYE!  The day of surgery: . Minimal physical activity, but bed rest is not required . No reading, computer work, or close hand work . No bending, lifting, or straining. . May watch TV  For 24 hours: . No driving, legal decisions, or alcoholic beverages . Safety precautions . Eat anything you prefer: It is better to start with liquids, then soup then solid foods. . Solar shield eyeglasses should be worn for comfort in the sunlight/patch while sleeping  Resume all regular medications including aspirin or Coumadin if these were discontinued prior to surgery. You may shower, bathe, shave, or wash your hair. Tylenol may be taken for mild discomfort. Follow Dr. Larrie Kass eye drop instruction sheet as reviewed.  Call your doctor if you experience significant pain, nausea, or vomiting, fever > 101 or other signs of infection. [phone] or [phone] Specific instructions:  Follow-up Information    [name], MD Follow up.   Specialty: Ophthalmology Why: Mar 30, 2020 @ 9:00 am Contact information: [address]   [place] [address] [phone]

## 2020-03-29 NOTE — Anesthesia Preprocedure Evaluation (Addendum)
Anesthesia Evaluation  Patient identified by MRN, date of birth, ID band Patient awake    Reviewed: Allergy & Precautions, H&P , NPO status , reviewed documented beta blocker date and time   Airway Mallampati: III  TM Distance: >3 FB Neck ROM: full    Dental  (+) Chipped, Teeth Intact   Pulmonary neg sleep apnea, Patient abstained from smoking.,    Pulmonary exam normal        Cardiovascular hypertension, + Past MI and +CHF  Normal cardiovascular exam+ dysrhythmias      Neuro/Psych    GI/Hepatic neg GERD  ,  Endo/Other  diabetes  Renal/GU ESRF and DialysisRenal diseaseDialysis yest     Musculoskeletal   Abdominal   Peds  Hematology   Anesthesia Other Findings Past Medical History: No date: CHF (congestive heart failure) ([facility]) No date: Diabetes mellitus without complication ([facility]) No date: ESRD on hemodialysis ([place]) No date: Hypertension No date: Kidney failure No date: Myocardial infarction [facility]) No date: Nonischemic cardiomyopathy [facility])  Past Surgical History: 08/15/2019: A/V FISTULAGRAM; N/A     Comment:  Procedure: A/V FISTULAGRAM;  Surgeon: [name], MD;  Location: [place] [facility];  Service:              Cardiovascular;  Laterality: N/A; No date: AV FISTULA PLACEMENT 08/13/2019: INSERTION OF DIALYSIS CATHETER     Comment:  Procedure: Insertion Of Dialysis Catheter;  Surgeon:               [name], MD;  Location: [place];  Service: Vascular;; No date: [place] 08/15/2019: PERIPHERAL VASCULAR BALLOON ANGIOPLASTY     Comment:  Procedure: PERIPHERAL VASCULAR BALLOON ANGIOPLASTY;                Surgeon: [name], MD;  Location: [place] [facility];  Service: Cardiovascular;; 08/13/2019: THROMBECTOMY AND REVISION OF ARTERIOVENTOUS (AV) GORETEX   GRAFT; Left     Comment:  Procedure: THROMBECTOMY OF LEFT ARM  ARTERIOVENTOUS (AV)               FISTULA;  Surgeon: [name], MD;  Location: [place];                Service: Vascular;  Laterality: Left; No date: TOE AMPUTATION  BMI    Body Mass Index: 34.03 kg/m      Reproductive/Obstetrics                            Anesthesia Physical Anesthesia Plan  ASA: IV  Anesthesia Plan: MAC   Post-op Pain Management:    Induction: Intravenous  PONV Risk Score and Plan: Treatment may vary due to age or medical condition and TIVA  Airway Management Planned: Nasal Cannula and Natural Airway  Additional Equipment:   Intra-op Plan:   Post-operative Plan:   Informed Consent: I have reviewed the patients History and Physical, chart, labs and discussed the procedure including the risks, benefits and alternatives for the proposed anesthesia with the patient or authorized representative who has indicated his/her understanding and acceptance.     Dental Advisory Given  Plan Discussed with: CRNA  Anesthesia Plan Comments:         Anesthesia Quick Evaluation

## 2020-03-29 NOTE — Anesthesia Postprocedure Evaluation (Signed)
Anesthesia Post Note  Patient: [name] POWELL Sr.  Procedure(s) Performed: CATARACT EXTRACTION PHACO AND INTRAOCULAR LENS PLACEMENT (IOC)  RIGHT DIABETIC (Right Eye)  Patient location during evaluation: Phase II Anesthesia Type: MAC Level of consciousness: awake and alert Pain management: pain level controlled Vital Signs Assessment: post-procedure vital signs reviewed and stable Respiratory status: spontaneous breathing, nonlabored ventilation and respiratory function stable Cardiovascular status: stable and blood pressure returned to baseline Postop Assessment: no apparent nausea or vomiting Anesthetic complications: no     Last Vitals:  Vitals:   03/29/20 0709 03/29/20 0852  BP: 135/81 134/70  Pulse: 87 78  Resp: 16 16  Temp: (!) 36.3 C (!) 36.3 C  SpO2: 97% 97%    Last Pain:  Vitals:   03/29/20 0852  TempSrc: Tympanic  PainSc: 0-No pain                 Alphonsus Sias

## 2020-03-29 NOTE — Transfer of Care (Signed)
Immediate Anesthesia Transfer of Care Note  Patient: [name] Cruel Sr.  Procedure(s) Performed: CATARACT EXTRACTION PHACO AND INTRAOCULAR LENS PLACEMENT (IOC)  RIGHT DIABETIC (Right Eye)  Patient Location: PACU  Anesthesia Type:MAC  Level of Consciousness: awake and patient cooperative  Airway & Oxygen Therapy: Patient Spontanous Breathing  Post-op Assessment: Report given to RN and Post -op Vital signs reviewed and stable  Post vital signs: Reviewed and stable  Last Vitals:  Vitals Value Taken Time  BP 134/70 03/29/20 0852  Temp 36.3 C 03/29/20 0852  Pulse 78 03/29/20 0852  Resp 16 03/29/20 0852  SpO2 97 % 03/29/20 0852    Last Pain:  Vitals:   03/29/20 0852  TempSrc: Tympanic  PainSc: 0-No pain         Complications: No apparent anesthesia complications

## 2020-03-29 NOTE — H&P (Signed)
The History and Physical notes are on paper, have been signed, and are to be scanned. The patient remains stable and unchanged from the H&P.   Previous H&P reviewed, patient examined, and there are no changes.  The patient has a visually significant cataract interfering with his or her vision.  I attest that the following are true and accurate to the best of my knowledge: 1. The patient's impairment of visual function is believed not to be correctable with a tolerable change in glasses or contact lenses. 2. Cataract (in the operative eye) is believed to be significantly contributing to the patient's visual impairment. 3. The patient desires surgical correction; the risks, benefits, and alternatives have been explained, and questions have been answered to the patients satisfaction.  A reasonable expectation exists that cataract surgery will significantly improve both the visual and functional status of the patient.  [name] [name] 03/29/2020 7:27 AM   

## 2020-04-04 LAB — POCT I-STAT, CHEM 8
BUN: 48 mg/dL — ABNORMAL HIGH (ref 6–20)
BUN: 48 mg/dL — ABNORMAL HIGH (ref 6–20)
Calcium, Ion: 0.79 mmol/L — CL (ref 1.15–1.40)
Calcium, Ion: 0.82 mmol/L — CL (ref 1.15–1.40)
Chloride: 100 mmol/L (ref 98–111)
Chloride: 98 mmol/L (ref 98–111)
Creatinine, Ser: 10 mg/dL — ABNORMAL HIGH (ref 0.61–1.24)
Creatinine, Ser: 9.8 mg/dL — ABNORMAL HIGH (ref 0.61–1.24)
Glucose, Bld: 244 mg/dL — ABNORMAL HIGH (ref 70–99)
Glucose, Bld: 246 mg/dL — ABNORMAL HIGH (ref 70–99)
HCT: 42 % (ref 39.0–52.0)
HCT: 44 % (ref 39.0–52.0)
Hemoglobin: 14.3 g/dL (ref 13.0–17.0)
Hemoglobin: 15 g/dL (ref 13.0–17.0)
Potassium: 6.5 mmol/L (ref 3.5–5.1)
Potassium: 6.5 mmol/L (ref 3.5–5.1)
Sodium: 132 mmol/L — ABNORMAL LOW (ref 135–145)
Sodium: 132 mmol/L — ABNORMAL LOW (ref 135–145)
TCO2: 30 mmol/L (ref 22–32)
TCO2: 32 mmol/L (ref 22–32)

## 2020-04-20 ENCOUNTER — Telehealth: Payer: Self-pay

## 2020-04-20 NOTE — Telephone Encounter (Addendum)
Entered in error

## 2020-06-07 ENCOUNTER — Other Ambulatory Visit: Payer: Self-pay

## 2020-06-07 DIAGNOSIS — Z992 Dependence on renal dialysis: Secondary | ICD-10-CM

## 2020-06-21 ENCOUNTER — Ambulatory Visit: Payer: Medicare Other

## 2020-06-21 ENCOUNTER — Encounter: Payer: Self-pay | Admitting: Surgery

## 2020-06-21 ENCOUNTER — Encounter (HOSPITAL_COMMUNITY): Payer: Medicare Other

## 2020-07-02 ENCOUNTER — Other Ambulatory Visit: Payer: Self-pay

## 2020-07-02 DIAGNOSIS — N186 End stage renal disease: Secondary | ICD-10-CM

## 2020-07-05 ENCOUNTER — Ambulatory Visit (INDEPENDENT_AMBULATORY_CARE_PROVIDER_SITE_OTHER): Payer: Medicare Other | Admitting: Physician Assistant

## 2020-07-05 ENCOUNTER — Encounter: Payer: Self-pay | Admitting: *Deleted

## 2020-07-05 ENCOUNTER — Ambulatory Visit (HOSPITAL_COMMUNITY)
Admission: RE | Admit: 2020-07-05 | Discharge: 2020-07-05 | Disposition: A | Discharge status: Home / Self Care | Payer: Medicare Other | Source: Ambulatory Visit | Attending: Vascular Surgery | Admitting: Vascular Surgery

## 2020-07-05 ENCOUNTER — Other Ambulatory Visit: Payer: Self-pay | Admitting: *Deleted

## 2020-07-05 ENCOUNTER — Other Ambulatory Visit: Payer: Self-pay

## 2020-07-05 VITALS — BP 123/75 | HR 74 | Temp 97.9°F | Resp 20 | Ht 73.0 in | Wt 261.5 lb

## 2020-07-05 DIAGNOSIS — T82590D Other mechanical complication of surgically created arteriovenous fistula, subsequent encounter: Secondary | ICD-10-CM | POA: Diagnosis not present

## 2020-07-05 DIAGNOSIS — N186 End stage renal disease: Secondary | ICD-10-CM | POA: Diagnosis present

## 2020-07-05 DIAGNOSIS — Z992 Dependence on renal dialysis: Secondary | ICD-10-CM

## 2020-07-05 NOTE — Progress Notes (Signed)
POST OPERATIVE DIALYSIS ACCESS OFFICE NOTE    CC:  Aneurysmal dilatation  HPI:  This is a 59 y.o. 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  He denies hand pain or numbness.  He states there are no flow right issues with treatment.  He tells me they are unable to technically access his fistula distal to the aneurysmal areas.  Fistulogram on 08/15/2019: Balloon angioplasty of left subclavian vein and left cephalic arch with 8, 10, 12 mm balloons. Persistent stenosis at thoracic outlet, but improved reflux. Cephalic arch stenosis resolved to less than 30%. He had undergone thrombectomy two days prior.  Dialysis days:  MWF   Dialysis center:  [facility]  No Known Allergies  Current Outpatient Medications  Medication Sig Dispense Refill  . amLODipine (NORVASC) 10 MG tablet Take 10 mg by mouth daily.    Marland Kitchen aspirin 81 MG chewable tablet Chew 81 mg by mouth daily.  1  . atorvastatin (LIPITOR) 40 MG tablet Take 40 mg by mouth every evening.   2  . bisacodyl (DULCOLAX) 5 MG EC tablet Take 1 tablet (5 mg total) by mouth at bedtime as needed for mild constipation or moderate constipation. 30 tablet 0  . carvedilol (COREG) 25 MG tablet Take 25 mg by mouth 2 (two) times daily.  3  . ENTRESTO 24-26 MG Take 1 tablet by mouth 2 (two) times daily.  3  . famotidine (PEPCID) 20 MG tablet Take 1 tablet (20 mg total) by mouth at bedtime. (Patient taking differently: Take 20 mg by mouth 2 (two) times daily. )    . HUMULIN 70/30 (70-30) 100 UNIT/ML injection Inject 20 Units into the skin 2 (two) times daily with a meal.   12  . lidocaine-prilocaine (EMLA) cream Apply 1 application topically every Monday, Wednesday, and Friday with hemodialysis.   3  . omeprazole (PRILOSEC) 20 MG capsule Take 20 mg by mouth daily before breakfast.    . sildenafil (VIAGRA) 100 MG tablet Take 100 mg by mouth daily as needed for erectile dysfunction.   0  .  sucroferric oxyhydroxide (VELPHORO) 500 MG chewable tablet Chew 1,000 mg by mouth 3 (three) times daily with meals.     No current facility-administered medications for this visit.     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  Good bruit and thrill.  Skin overlying aneurysmal area is thin with loss of pigmentation.     Dialysis duplex on 07/05/2020 AVF: Stenosis noted in the elevated velocities at previous PTA site. Patent mid upper arm stent with no evidence of restenosis  Assessment/Plan:  Significant enlargement of midportion of AV fistula with thin skin at risk for spontaneous rupture.  Recommend plication.  I explained to the patient that we would set this up on a nondialysis day and that the surgeon would evaluate his fistula on the day of the procedure.  Depending upon the extent of the plication, he may require tunneled dialysis catheter placement until the fistula is adequately healed to access.  We reviewed risks of the procedure that include, but are not limited to,  bleeding, infection, pneumothorax.  His questions are answered and he agrees with this treatment plan.    Barbie Banner, PA-C 07/05/2020 10:43 AM [facility] [phone]  Clinic MD:  Oneida Alar

## 2020-07-05 NOTE — H&P (View-Only) (Signed)
POST OPERATIVE DIALYSIS ACCESS OFFICE NOTE    CC:  Aneurysmal dilatation  HPI:  This is a 59 y.o. 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  He denies hand pain or numbness.  He states there are no flow right issues with treatment.  He tells me they are unable to technically access his fistula distal to the aneurysmal areas.  Fistulogram on 08/15/2019: Balloon angioplasty of left subclavian vein and left cephalic arch with 8, 10, 12 mm balloons. Persistent stenosis at thoracic outlet, but improved reflux. Cephalic arch stenosis resolved to less than 30%. He had undergone thrombectomy two days prior.  Dialysis days:  MWF   Dialysis center:  [facility]  No Known Allergies  Current Outpatient Medications  Medication Sig Dispense Refill  . amLODipine (NORVASC) 10 MG tablet Take 10 mg by mouth daily.    Marland Kitchen aspirin 81 MG chewable tablet Chew 81 mg by mouth daily.  1  . atorvastatin (LIPITOR) 40 MG tablet Take 40 mg by mouth every evening.   2  . bisacodyl (DULCOLAX) 5 MG EC tablet Take 1 tablet (5 mg total) by mouth at bedtime as needed for mild constipation or moderate constipation. 30 tablet 0  . carvedilol (COREG) 25 MG tablet Take 25 mg by mouth 2 (two) times daily.  3  . ENTRESTO 24-26 MG Take 1 tablet by mouth 2 (two) times daily.  3  . famotidine (PEPCID) 20 MG tablet Take 1 tablet (20 mg total) by mouth at bedtime. (Patient taking differently: Take 20 mg by mouth 2 (two) times daily. )    . HUMULIN 70/30 (70-30) 100 UNIT/ML injection Inject 20 Units into the skin 2 (two) times daily with a meal.   12  . lidocaine-prilocaine (EMLA) cream Apply 1 application topically every Monday, Wednesday, and Friday with hemodialysis.   3  . omeprazole (PRILOSEC) 20 MG capsule Take 20 mg by mouth daily before breakfast.    . sildenafil (VIAGRA) 100 MG tablet Take 100 mg by mouth daily as needed for erectile dysfunction.   0  .  sucroferric oxyhydroxide (VELPHORO) 500 MG chewable tablet Chew 1,000 mg by mouth 3 (three) times daily with meals.     No current facility-administered medications for this visit.     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  Good bruit and thrill.  Skin overlying aneurysmal area is thin with loss of pigmentation.     Dialysis duplex on 07/05/2020 AVF: Stenosis noted in the elevated velocities at previous PTA site. Patent mid upper arm stent with no evidence of restenosis  Assessment/Plan:  Significant enlargement of midportion of AV fistula with thin skin at risk for spontaneous rupture.  Recommend plication.  I explained to the patient that we would set this up on a nondialysis day and that the surgeon would evaluate his fistula on the day of the procedure.  Depending upon the extent of the plication, he may require tunneled dialysis catheter placement until the fistula is adequately healed to access.  We reviewed risks of the procedure that include, but are not limited to,  bleeding, infection, pneumothorax.  His questions are answered and he agrees with this treatment plan.    Barbie Banner, PA-C 07/05/2020 10:43 AM [facility] [phone]  Clinic MD:  Oneida Alar

## 2020-07-17 ENCOUNTER — Other Ambulatory Visit
Admission: RE | Admit: 2020-07-17 | Discharge: 2020-07-17 | Disposition: A | Discharge status: Home / Self Care | Payer: Medicare Other | Source: Ambulatory Visit | Attending: Vascular Surgery | Admitting: Vascular Surgery

## 2020-07-17 ENCOUNTER — Other Ambulatory Visit: Payer: Self-pay

## 2020-07-17 DIAGNOSIS — Z20822 Contact with and (suspected) exposure to covid-19: Secondary | ICD-10-CM | POA: Insufficient documentation

## 2020-07-17 DIAGNOSIS — Z01812 Encounter for preprocedural laboratory examination: Secondary | ICD-10-CM | POA: Insufficient documentation

## 2020-07-17 LAB — SARS CORONAVIRUS 2 (TAT 6-24 HRS): SARS Coronavirus 2: NEGATIVE

## 2020-07-18 ENCOUNTER — Encounter (HOSPITAL_COMMUNITY): Payer: Self-pay | Admitting: Vascular Surgery

## 2020-07-18 NOTE — Progress Notes (Signed)
Spoke with Daughter Patsey Berthold and patient via 3 way call initiated by Daughter Cierra.    PCP - Dr Sharee Pimple @[facility]  Primary Care Cardiologist - [place]  Chest x-ray - 08/11/20 (2V) EKG - 08/12/19, 02/12/20 CE-requested tracing Stress Test - 12/29/19 ECHO - 12/30/19 Cardiac Cath - n/a  Anesthesia review: Yes  STOP now taking any Aspirin (unless otherwise instructed by your surgeon), Aleve, Naproxen, Ibuprofen, Motrin, Advil, Goody's, BC's, all herbal medications, fish oil, and all vitamins.   . THE NIGHT BEFORE SURGERY, take 14 units Humulin 70/30 Insulin     . THE MORNING OF SURGERY, do not take any Humulin 70/30 Insulin.  Marland Kitchen f your blood sugar is less than 70 mg/dL, you will need to treat for low blood sugar: o Treat a low blood sugar (less than 70 mg/dL) with  cup of clear juice (cranberry or apple), 4 glucose tablets, OR glucose gel. o Recheck blood sugar in 15 minutes after treatment (to make sure it is greater than 70 mg/dL). If your blood sugar is not greater than 70 mg/dL on recheck, call [phone] for further instructions.  Coronavirus Screening Covid test on 07/17/20 was negative  Patient and Daughter Valinda Party verbalized understanding of instructions that were given via phone.

## 2020-07-18 NOTE — Anesthesia Preprocedure Evaluation (Addendum)
Anesthesia Evaluation  Patient identified by MRN, date of birth, ID band Patient awake    Reviewed: Allergy & Precautions, NPO status , Patient's Chart, lab work & pertinent test results, reviewed documented beta blocker date and time   History of Anesthesia Complications Negative for: history of anesthetic complications  Airway Mallampati: II  TM Distance: >3 FB Neck ROM: Full    Dental  (+) Dental Advisory Given   Pulmonary Patient abstained from smoking.,  07/17/2020 SARS coronavirus NEG   breath sounds clear to auscultation       Cardiovascular hypertension, Pt. on medications and Pt. on home beta blockers (-) angina+ CAD (non-obstructive) and +CHF   Rhythm:Regular Rate:Normal  12/2019 ECHO: leftventricle is normal in size with moderately to severely increased wall thickness.  left ventricular systolic function is borderline, LVEF is visually  estimated at 50%.  grade II diastolic dysfunction, no significant valvular abnormalities    Neuro/Psych negative neurological ROS     GI/Hepatic negative GI ROS, Neg liver ROS,   Endo/Other  diabetes (glu 455, treated and down to 337), Insulin DependentMorbid obesity  Renal/GU Dialysis and ESRFRenal disease (MWF)     Musculoskeletal   Abdominal (+) + obese,   Peds  Hematology   Anesthesia Other Findings   Reproductive/Obstetrics                           Anesthesia Physical Anesthesia Plan  ASA: III  Anesthesia Plan: MAC   Post-op Pain Management:    Induction:   PONV Risk Score and Plan: 1 and Treatment may vary due to age or medical condition  Airway Management Planned: Natural Airway and Simple Face Mask  Additional Equipment: None  Intra-op Plan:   Post-operative Plan:   Informed Consent: I have reviewed the patients History and Physical, chart, labs and discussed the procedure including the risks, benefits and alternatives for  the proposed anesthesia with the patient or authorized representative who has indicated his/her understanding and acceptance.     Dental advisory given  Plan Discussed with: CRNA and Surgeon  Anesthesia Plan Comments: (See PAT note written 07/18/2020 by Myra Gianotti, PA-C. SAME DAY WORK-UP   )      Anesthesia Quick Evaluation

## 2020-07-18 NOTE — Progress Notes (Addendum)
Anesthesia Chart Review: SAME DAY WORK-UP   Case: [id number] Date/Time: 07/19/20 0905   Procedure: REVISON/PLICATION OF ARTERIOVENOUS FISTULA LEFT (Left )   Anesthesia type: General   Pre-op diagnosis: ESRD   Location: [facility] OR ROOM 11 / [facility]   Surgeons: [name], MD      DISCUSSION: Patient is a 59 year old male scheduled for the above procedure. He has "Significant enlargement of midportion of AV fistula with thin skin at risk for spontaneous rupture. Recommend plication". Has HD on MWF.   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   According to records in [place], history of incarceration with release in early 2019. Was seen by cardiologist Ernesta Amble, MD initially on 03/17/18 ([place]) for follow-up non-ischemic cardiomyopathy. Notes suggest hospitalization at either [facility] or [facility] in early 2019 with diagnosis of non-ischemic cardiomyopathy (LVEF 35-40%) and "Cardiac catheterization showed no significant coronary artery disease.  He was discharged on Entresto and carvedilol." Last evaluated at [facility] 12/01/19 by Justus Memory, NP and was doing well from a CV/HF standpoint. Volume managed by dialysis. Repeat echo ordered to see if EF > 35% on medical management. Since then the echo, as well as a stress test, were done through [facility] (and not [facility]) as part of his pre-transplant work-up. Stress test showed probable ischemia and possible subtle scar (see below), EF 35% but on echo LVEF visually was 50%. Other echo findings showed grade 2 diastolic dysfunction, low  normal RV systolic function, estimated PASP 34 mmHg. Following theses studies, there is documentation that Gildardo Pounds, RN asked [facility] cardiologist Stephani Police, MD to review cardiac studies with response of "Looks ok, follows at [facility]."  He now has a telemedicine visit with Dr. Kennith Center scheduled for 11/22/19.   A1c 10.9% on 07/12/20, down from > 14% 11/29/19 and 11.9% on 06/07/20 (Care Everywhere). Insulin further adjusted at that endocrinology visit.   Patient denied any cardiac symptoms per PAT phone RN interview this afternoon.   Stress test > 6 months ago showed likely ischemia. EF improved by echo to ~ 50%. Attempting to get copy of 2019 cath report from [place], but no significant CAD then by notes. He was last seen by [facility] cardiology 11/2019 and next cardiology evaluation is scheduled with [facility] for 08/2020. Reviewed with anesthesiologist Lillia Abed, MD. Patient is a same day work-up. He currently undergoes HD 3x/week. No cardiac symptoms by RN interview. 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 Anesthesia team to evaluate on the day of surgery.  07/17/2020 presurgical COVID-19 test negative.  ADDENDUM 07/19/20 9:29 AM: Records received from [facility]. 02/17/18 LHC showed non-obstructive CAD with EF 25% (35-40% by 02/16/18 echo). Results outlined in CV below.   VS: At 07/12/20 endocrinology visit: Blood Pressure 123/98  Pulse 65  Respiratory Rate 16  Weight 119.4 kg (263 lb 3.2 oz)  Height 185.4 cm (6\' 1" )  Body Mass Index 34.73     PROVIDERS: Dancel, Rex, MD is PCP ([facility] Care Everywhere) - Donato Heinz, MD is nephrologist - Cardiologist Ernesta Amble, MD ([place], [place]). Last visit 12/01/19  with Justus Memory, NP with echo planned to reassess LVEF. Six month follow-up. Now has telemedicine visit with [facility] Cardiologist Stephani Police, MD scheduled for 08/21/20 following 12/2019 cardiac testing for pre-renal  transplant. (Of note, his only cardiology evaluation at [facility] was on 08/12/19 by Larae Grooms, MD for abnormal EKG in setting of dyspnea with missed dialysis. Initial concern for STEMI, but then felt due to chronic LBBB. F/U with [facility] cardiologist recommended.)  - Dostou, Joelyn Oms, MD is endocrinologist ([place]). Last visit 07/12/20. Established after his pre-transplant work-up with A1c > 14 11/29/19. A1c still up but down to 10.9%. Insulin adjusted.   LABS: For day of surgery.    IMAGES: CXR 12/29/19: FINDINGS:  Linear right basilar opacities are slightly more pronounced from prior, likely atelectasis.  No pleural effusion or pneumothorax.  Similar cardiomediastinal silhouette.  IMPRESSION: No acute findings.    EKG: Requested. Has history of LBBB on previous tracings.   CV: Echo 12/30/19 ([place]): Summary  1. The left ventricle is normal in size with moderately to severely  increased wall thickness.  2. The left ventricular systolic function is borderline, LVEF is visually  estimated at 50%.  3. There is grade II diastolic dysfunction (elevated filling pressure).  4. Mitral annular calcification is present (mild).  Trivial mitral valve regurgitation. 5. The left atrium is mildly dilated in size.  6. The aortic valve is probably trileaflet with moderately thickened  leaflets with mildly reduced excursion. No significant aortic regurgitation.  No evidence of a significant transvalvular gradient. 7. The aorta at the sinuses of Valsalva is mildly dilated.  8. The right ventricle is normal in size, with low normal systolic function.  9. There is borderline pulmonary hypertension, estimated pulmonary artery  systolic pressure is 34 mmHg.     Nuclear stress test 12/29/19 ([place]): Impressions:  - Abnormal myocardial perfusion study  - There is a large in size, mild in severity defect involving the basal  anterior,  mid anterior, apical anterior, apical and apical inferior  segments. The basal anterior, mid anterior and apical anterior segments  are nearly completely reversible. The apical and apical inferior segments  are minimally reversible. This is consistent with probable ischemia and  possible subtle scar.  - Post stress: Global systolic function is moderately reduced. The  ejection fraction calculated at 35%.  - Coronary calcifications are noted   Cardiac cath 02/17/18 [facility], Berniece Pap, MD; indications: NSTEMI, newly diagnosed severe CM, abnormal ECG): Findings: 1.  The left main arose from the left sinus of Valsalva and divided left anterior descending artery and circumflex.  The left main was free of any disease. 2.  The left anterior descending artery had 30% proximal disease. 3.  The circumflex was nondominant medium caliber vessel and was free of any disease.  Right coronary artery arose from right sinus of Valsalva.  It was a dominant vessel and had 30% disease distally. 4.  Left ventricular end-diastolic pressure was about 25-30.  The ejection fraction was 25%.  There was no gradient across aortic valve.  The ejection fraction was globally impaired without regional wall motion abnormality. Summary: 1.  Nonobstructive coronary artery disease. 2.  Severely impaired left ventricular systolic function. Recommendations: 1.  Optimize medication for CHF. 2.  Recommend statins and aspirin 81 mg p.o. daily in view of nonobstructive coronary artery disease. (Echo 02/16/18: Normal LV size, EF 35-40%, severe concentric LVH, normal RV size with normal function, ectatic aorta 4.1 cm,  moderate TR).    Past Medical History:  Diagnosis Date   CHF (congestive heart failure) ([place])    Diabetes mellitus without complication ([place])    type 2   ESRD on hemodialysis ([facility])    mon wed fri   Hypertension    Kidney failure    Myocardial infarction ([place])    Nonischemic cardiomyopathy [facility])      Past Surgical History:  Procedure Laterality Date   A/V FISTULAGRAM N/A 08/15/2019   Procedure: A/V FISTULAGRAM;  Surgeon: [name], MD;  Location: [place] CV [facility];  Service: Cardiovascular;  Laterality: N/A;   AV FISTULA PLACEMENT     CATARACT EXTRACTION W/PHACO Right 03/29/2020   Procedure: CATARACT EXTRACTION PHACO AND INTRAOCULAR LENS PLACEMENT ([place])  RIGHT DIABETIC;  Surgeon: [name], MD;  Location: [facility] ORS;  Service: Ophthalmology;  Laterality: Right;  Lot # [id number] H [place]: 02:30.9 AP% 9.5% CDE: 14.38   INSERTION OF DIALYSIS CATHETER  08/13/2019   Procedure: Insertion Of Dialysis Catheter;  Surgeon: [name], MD;  Location: [place];  Service: Vascular;;   IRRIGATION AND DEBRIDEMENT ABSCESS     PERIPHERAL VASCULAR BALLOON ANGIOPLASTY  08/15/2019   Procedure: PERIPHERAL VASCULAR BALLOON ANGIOPLASTY;  Surgeon: [name], MD;  Location: [place] CV LAB;  Service: Cardiovascular;;   THROMBECTOMY AND REVISION OF ARTERIOVENTOUS (AV) GORETEX  GRAFT Left 08/13/2019   Procedure: THROMBECTOMY OF LEFT ARM ARTERIOVENTOUS (AV) FISTULA;  Surgeon: [name], MD;  Location: [facility] OR;  Service: Vascular;  Laterality: Left;   TOE AMPUTATION      MEDICATIONS: No current facility-administered medications for this encounter.    aspirin 81 MG chewable tablet   bisacodyl (DULCOLAX) 5 MG EC tablet   carvedilol (COREG) 25 MG tablet   ENTRESTO 24-26 MG   famotidine (PEPCID) 20 MG tablet   HUMULIN 70/30 (70-30) 100 UNIT/ML injection   lidocaine-prilocaine (EMLA) cream   sildenafil (VIAGRA) 100 MG tablet   sucroferric oxyhydroxide (VELPHORO) 500 MG chewable tablet   Etelcalcetide HCl (PARSABIV IV)   heparin 1000 unit/mL SOLN injection     Myra Gianotti, PA-C Surgical Short Stay/Anesthesiology [facility] Phone [phone] [facility] Phone [phone] 07/18/2020 5:00 PM

## 2020-07-19 ENCOUNTER — Ambulatory Visit (HOSPITAL_COMMUNITY): Payer: Medicare Other

## 2020-07-19 ENCOUNTER — Encounter (HOSPITAL_COMMUNITY)
Admission: RE | Disposition: A | Discharge status: Home / Self Care | Payer: Self-pay | Source: Home / Self Care | Attending: Vascular Surgery

## 2020-07-19 ENCOUNTER — Ambulatory Visit (HOSPITAL_COMMUNITY)
Admission: RE | Admit: 2020-07-19 | Discharge: 2020-07-19 | Disposition: A | Discharge status: Home / Self Care | Payer: Medicare Other | Attending: Vascular Surgery | Admitting: Vascular Surgery

## 2020-07-19 ENCOUNTER — Other Ambulatory Visit: Payer: Self-pay

## 2020-07-19 ENCOUNTER — Ambulatory Visit (HOSPITAL_COMMUNITY): Payer: Medicare Other | Admitting: Vascular Surgery

## 2020-07-19 ENCOUNTER — Encounter (HOSPITAL_COMMUNITY): Payer: Self-pay | Admitting: Vascular Surgery

## 2020-07-19 DIAGNOSIS — Z79899 Other long term (current) drug therapy: Secondary | ICD-10-CM | POA: Insufficient documentation

## 2020-07-19 DIAGNOSIS — E669 Obesity, unspecified: Secondary | ICD-10-CM | POA: Diagnosis not present

## 2020-07-19 DIAGNOSIS — Z992 Dependence on renal dialysis: Secondary | ICD-10-CM | POA: Insufficient documentation

## 2020-07-19 DIAGNOSIS — Z794 Long term (current) use of insulin: Secondary | ICD-10-CM | POA: Diagnosis not present

## 2020-07-19 DIAGNOSIS — I509 Heart failure, unspecified: Secondary | ICD-10-CM | POA: Diagnosis not present

## 2020-07-19 DIAGNOSIS — T82510A Breakdown (mechanical) of surgically created arteriovenous fistula, initial encounter: Secondary | ICD-10-CM | POA: Insufficient documentation

## 2020-07-19 DIAGNOSIS — N186 End stage renal disease: Secondary | ICD-10-CM

## 2020-07-19 DIAGNOSIS — T82868A Thrombosis of vascular prosthetic devices, implants and grafts, initial encounter: Secondary | ICD-10-CM | POA: Diagnosis not present

## 2020-07-19 DIAGNOSIS — I132 Hypertensive heart and chronic kidney disease with heart failure and with stage 5 chronic kidney disease, or end stage renal disease: Secondary | ICD-10-CM | POA: Insufficient documentation

## 2020-07-19 DIAGNOSIS — I252 Old myocardial infarction: Secondary | ICD-10-CM | POA: Diagnosis not present

## 2020-07-19 DIAGNOSIS — Z9889 Other specified postprocedural states: Secondary | ICD-10-CM

## 2020-07-19 DIAGNOSIS — Z7982 Long term (current) use of aspirin: Secondary | ICD-10-CM | POA: Diagnosis not present

## 2020-07-19 DIAGNOSIS — X58XXXA Exposure to other specified factors, initial encounter: Secondary | ICD-10-CM | POA: Diagnosis not present

## 2020-07-19 DIAGNOSIS — Z6834 Body mass index (BMI) 34.0-34.9, adult: Secondary | ICD-10-CM | POA: Insufficient documentation

## 2020-07-19 DIAGNOSIS — I2510 Atherosclerotic heart disease of native coronary artery without angina pectoris: Secondary | ICD-10-CM | POA: Diagnosis not present

## 2020-07-19 DIAGNOSIS — T82898A Other specified complication of vascular prosthetic devices, implants and grafts, initial encounter: Secondary | ICD-10-CM | POA: Diagnosis not present

## 2020-07-19 DIAGNOSIS — E1122 Type 2 diabetes mellitus with diabetic chronic kidney disease: Secondary | ICD-10-CM | POA: Diagnosis not present

## 2020-07-19 HISTORY — PX: REVISON OF ARTERIOVENOUS FISTULA: SHX6074

## 2020-07-19 HISTORY — PX: INSERTION OF DIALYSIS CATHETER: SHX1324

## 2020-07-19 LAB — POCT I-STAT, CHEM 8
BUN: 45 mg/dL — ABNORMAL HIGH (ref 6–20)
Calcium, Ion: 0.95 mmol/L — ABNORMAL LOW (ref 1.15–1.40)
Chloride: 96 mmol/L — ABNORMAL LOW (ref 98–111)
Creatinine, Ser: 10 mg/dL — ABNORMAL HIGH (ref 0.61–1.24)
Glucose, Bld: 403 mg/dL — ABNORMAL HIGH (ref 70–99)
HCT: 43 % (ref 39.0–52.0)
Hemoglobin: 14.6 g/dL (ref 13.0–17.0)
Potassium: 4.1 mmol/L (ref 3.5–5.1)
Sodium: 132 mmol/L — ABNORMAL LOW (ref 135–145)
TCO2: 30 mmol/L (ref 22–32)

## 2020-07-19 LAB — GLUCOSE, CAPILLARY
Glucose-Capillary: 442 mg/dL — ABNORMAL HIGH (ref 70–99)
Glucose-Capillary: 455 mg/dL — ABNORMAL HIGH (ref 70–99)
Glucose-Capillary: 337 mg/dL — ABNORMAL HIGH (ref 70–99)
Glucose-Capillary: 175 mg/dL — ABNORMAL HIGH (ref 70–99)
Glucose-Capillary: 212 mg/dL — ABNORMAL HIGH (ref 70–99)
Glucose-Capillary: 170 mg/dL — ABNORMAL HIGH (ref 70–99)
Glucose-Capillary: 193 mg/dL — ABNORMAL HIGH (ref 70–99)

## 2020-07-19 SURGERY — REVISON OF ARTERIOVENOUS FISTULA
Anesthesia: Monitor Anesthesia Care | Site: Chest | Laterality: Left

## 2020-07-19 MED ORDER — OXYCODONE HCL 5 MG PO TABS: 5.0000 mg | ORAL_TABLET | Freq: Once | ORAL | Status: DC | PRN

## 2020-07-19 MED ORDER — OXYCODONE-ACETAMINOPHEN 5-325 MG PO TABS: 1.0000 | ORAL_TABLET | Freq: Four times a day (QID) | ORAL | 0 refills | Status: DC | PRN

## 2020-07-19 MED ORDER — DEXTROSE 5 % IV SOLN
3.0000 g | INTRAVENOUS | Status: AC
  Administered 2020-07-19: 2 g via INTRAVENOUS
  Filled 2020-07-19: qty 3

## 2020-07-19 MED ORDER — DEXTROSE 50 % IV SOLN
INTRAVENOUS | Status: DC | PRN
  Administered 2020-07-19: 12.5 mL via INTRAVENOUS

## 2020-07-19 MED ORDER — FENTANYL CITRATE (PF) 100 MCG/2ML IJ SOLN
INTRAMUSCULAR | Status: AC
  Filled 2020-07-19: qty 2

## 2020-07-19 MED ORDER — CHLORHEXIDINE GLUCONATE 4 % EX LIQD: 60.0000 mL | Freq: Once | CUTANEOUS | Status: DC

## 2020-07-19 MED ORDER — MIDAZOLAM HCL 2 MG/2ML IJ SOLN
INTRAMUSCULAR | Status: DC | PRN
  Administered 2020-07-19 (×2): 1 mg via INTRAVENOUS

## 2020-07-19 MED ORDER — LIDOCAINE 2% (20 MG/ML) 5 ML SYRINGE
INTRAMUSCULAR | Status: DC | PRN
  Administered 2020-07-19: 20 mg via INTRAVENOUS

## 2020-07-19 MED ORDER — MIDAZOLAM HCL 2 MG/2ML IJ SOLN: 0.5000 mg | Freq: Once | INTRAMUSCULAR | Status: DC | PRN

## 2020-07-19 MED ORDER — LIDOCAINE-EPINEPHRINE (PF) 1 %-1:200000 IJ SOLN
INTRAMUSCULAR | Status: DC | PRN
  Administered 2020-07-19: 20 mL via INTRADERMAL

## 2020-07-19 MED ORDER — FENTANYL CITRATE (PF) 250 MCG/5ML IJ SOLN
INTRAMUSCULAR | Status: AC
Start: 2020-07-19 — End: ?
  Filled 2020-07-19: qty 5

## 2020-07-19 MED ORDER — HEPARIN SODIUM (PORCINE) 1000 UNIT/ML IJ SOLN
INTRAMUSCULAR | Status: DC | PRN
  Administered 2020-07-19: 3800 [IU]

## 2020-07-19 MED ORDER — PROPOFOL 500 MG/50ML IV EMUL
INTRAVENOUS | Status: DC | PRN
  Administered 2020-07-19: 100 ug/kg/min via INTRAVENOUS

## 2020-07-19 MED ORDER — SODIUM CHLORIDE 0.9 % IV SOLN: INTRAVENOUS | Status: DC

## 2020-07-19 MED ORDER — LIDOCAINE 2% (20 MG/ML) 5 ML SYRINGE
INTRAMUSCULAR | Status: AC
  Filled 2020-07-19: qty 10

## 2020-07-19 MED ORDER — MIDAZOLAM HCL 2 MG/2ML IJ SOLN
INTRAMUSCULAR | Status: AC
  Filled 2020-07-19: qty 2

## 2020-07-19 MED ORDER — SODIUM CHLORIDE 0.9 % IV SOLN
INTRAVENOUS | Status: DC | PRN
  Administered 2020-07-19: 500 mL

## 2020-07-19 MED ORDER — PHENYLEPHRINE HCL-NACL 10-0.9 MG/250ML-% IV SOLN
INTRAVENOUS | Status: DC | PRN
  Administered 2020-07-19: 25 ug/min via INTRAVENOUS

## 2020-07-19 MED ORDER — OXYCODONE HCL 5 MG/5ML PO SOLN: 5.0000 mg | Freq: Once | ORAL | Status: DC | PRN

## 2020-07-19 MED ORDER — FENTANYL CITRATE (PF) 100 MCG/2ML IJ SOLN
INTRAMUSCULAR | Status: DC | PRN
Start: 2020-07-19 — End: 2020-07-19
  Administered 2020-07-19 (×2): 25 ug via INTRAVENOUS

## 2020-07-19 MED ORDER — CHLORHEXIDINE GLUCONATE 0.12 % MT SOLN
OROMUCOSAL | Status: AC
  Administered 2020-07-19: 15 mL
  Filled 2020-07-19: qty 15

## 2020-07-19 MED ORDER — PROMETHAZINE HCL 25 MG/ML IJ SOLN: 6.2500 mg | INTRAMUSCULAR | Status: DC | PRN

## 2020-07-19 MED ORDER — MEPERIDINE HCL 25 MG/ML IJ SOLN: 6.2500 mg | INTRAMUSCULAR | Status: DC | PRN

## 2020-07-19 MED ORDER — FENTANYL CITRATE (PF) 100 MCG/2ML IJ SOLN
25.0000 ug | INTRAMUSCULAR | Status: DC | PRN
  Administered 2020-07-19: 50 ug via INTRAVENOUS

## 2020-07-19 MED ORDER — PHENYLEPHRINE 40 MCG/ML (10ML) SYRINGE FOR IV PUSH (FOR BLOOD PRESSURE SUPPORT)
PREFILLED_SYRINGE | INTRAVENOUS | Status: DC | PRN
  Administered 2020-07-19: 200 ug via INTRAVENOUS

## 2020-07-19 MED ORDER — 0.9 % SODIUM CHLORIDE (POUR BTL) OPTIME
TOPICAL | Status: DC | PRN
  Administered 2020-07-19: 1000 mL

## 2020-07-19 MED ORDER — INSULIN NPH (HUMAN) (ISOPHANE) 100 UNIT/ML ~~LOC~~ SUSP
10.0000 [IU] | Freq: Once | SUBCUTANEOUS | Status: AC
  Administered 2020-07-19: 10 [IU] via SUBCUTANEOUS
  Filled 2020-07-19: qty 10

## 2020-07-19 SURGICAL SUPPLY — 50 items
ARMBAND PINK RESTRICT EXTREMIT (MISCELLANEOUS) ×4 IMPLANT
BIOPATCH RED 1 DISK 7.0 (GAUZE/BANDAGES/DRESSINGS) ×3 IMPLANT
BIOPATCH RED 1IN DISK 7.0MM (GAUZE/BANDAGES/DRESSINGS) ×1
CANISTER SUCT 3000ML PPV (MISCELLANEOUS) ×4 IMPLANT
CATH BEACON 5 .035 40 KMP TP (CATHETERS) ×2 IMPLANT
CATH BEACON 5 .038 40 KMP TP (CATHETERS) ×3
CATH PALINDROME-P 19CM W/VT (CATHETERS) ×4 IMPLANT
CATH PALINDROME-P 23CM W/VT (CATHETERS) ×4 IMPLANT
CLIP VESOCCLUDE MED 6/CT (CLIP) ×4 IMPLANT
CLIP VESOCCLUDE SM WIDE 6/CT (CLIP) ×4 IMPLANT
COVER PROBE W GEL 5X96 (DRAPES) ×4 IMPLANT
DERMABOND ADVANCED (GAUZE/BANDAGES/DRESSINGS) ×2
DERMABOND ADVANCED .7 DNX12 (GAUZE/BANDAGES/DRESSINGS) ×2 IMPLANT
DRAPE C-ARM 42X72 X-RAY (DRAPES) ×4 IMPLANT
DRAPE ORTHO SPLIT 77X108 STRL (DRAPES) ×3
DRAPE ORTHO SPLIT 87X125 STRL (DRAPES) ×4 IMPLANT
DRAPE SURG ORHT 6 SPLT 77X108 (DRAPES) ×2 IMPLANT
ELECT REM PT RETURN 9FT ADLT (ELECTROSURGICAL) ×4
ELECTRODE REM PT RTRN 9FT ADLT (ELECTROSURGICAL) ×2 IMPLANT
GLIDEWIRE ADV .035X180CM (WIRE) ×4 IMPLANT
GLOVE BIO SURGEON STRL SZ7.5 (GLOVE) ×4 IMPLANT
GLOVE BIOGEL PI IND STRL 6.5 (GLOVE) ×8 IMPLANT
GLOVE BIOGEL PI IND STRL 7.0 (GLOVE) ×2 IMPLANT
GLOVE BIOGEL PI IND STRL 7.5 (GLOVE) ×2 IMPLANT
GLOVE BIOGEL PI INDICATOR 6.5 (GLOVE) ×8
GLOVE BIOGEL PI INDICATOR 7.0 (GLOVE) ×2
GLOVE BIOGEL PI INDICATOR 7.5 (GLOVE) ×2
GOWN STRL REUS W/ TWL LRG LVL3 (GOWN DISPOSABLE) ×4 IMPLANT
GOWN STRL REUS W/ TWL XL LVL3 (GOWN DISPOSABLE) ×2 IMPLANT
GOWN STRL REUS W/TWL LRG LVL3 (GOWN DISPOSABLE) ×4
GOWN STRL REUS W/TWL XL LVL3 (GOWN DISPOSABLE) ×3
KIT BASIN OR (CUSTOM PROCEDURE TRAY) ×4 IMPLANT
KIT TURNOVER KIT B (KITS) ×4 IMPLANT
LOOP VESSEL MAXI BLUE (MISCELLANEOUS) ×4 IMPLANT
NEEDLE 18GX1X1/2 (RX/OR ONLY) (NEEDLE) ×4 IMPLANT
NS IRRIG 1000ML POUR BTL (IV SOLUTION) ×4 IMPLANT
PACK CV ACCESS (CUSTOM PROCEDURE TRAY) ×4 IMPLANT
PAD ARMBOARD 7.5X6 YLW CONV (MISCELLANEOUS) ×8 IMPLANT
SUT MNCRL AB 4-0 PS2 18 (SUTURE) ×4 IMPLANT
SUT PROLENE 4 0 SH DA (SUTURE) ×4 IMPLANT
SUT PROLENE 6 0 BV (SUTURE) ×4 IMPLANT
SUT SILK 3 0 (SUTURE) ×3
SUT SILK 3-0 18XBRD TIE 12 (SUTURE) ×2 IMPLANT
SUT VIC AB 3-0 SH 27 (SUTURE) ×3
SUT VIC AB 3-0 SH 27X BRD (SUTURE) ×2 IMPLANT
SYR 10ML LL (SYRINGE) ×4 IMPLANT
TOWEL GREEN STERILE (TOWEL DISPOSABLE) ×4 IMPLANT
UNDERPAD 30X36 HEAVY ABSORB (UNDERPADS AND DIAPERS) ×4 IMPLANT
WATER STERILE IRR 1000ML POUR (IV SOLUTION) ×4 IMPLANT
WIRE AMPLATZ SS-J .035X180CM (WIRE) ×4 IMPLANT

## 2020-07-19 NOTE — Interval H&P Note (Signed)
History and Physical Interval Note:  07/19/2020 8:57 AM  [name] [name] Sr.  has presented today for surgery, with the diagnosis of ESRD.  The various methods of treatment have been discussed with the patient and family. After consideration of risks, benefits and other options for treatment, the patient has consented to  Procedure(s): REVISON/PLICATION OF ARTERIOVENOUS FISTULA LEFT (Left) as a surgical intervention and will also place tunneled dialysis catheter.  The patient's history has been reviewed, patient examined, no change in status, stable for surgery.  I have reviewed the patient's chart and labs.  Questions were answered to the patient's satisfaction.     [name]

## 2020-07-19 NOTE — Anesthesia Postprocedure Evaluation (Signed)
Anesthesia Post Note  Patient: GEORDIE NOONEY Sr.  Procedure(s) Performed: REVISON/PLICATION OF ARTERIOVENOUS FISTULA LEFT (Left Arm Upper) INSERTION OF DIALYSIS CATHETER (Left Chest)     Patient location during evaluation: PACU Anesthesia Type: MAC Level of consciousness: patient cooperative, oriented and awake and alert Pain management: pain level controlled Vital Signs Assessment: post-procedure vital signs reviewed and stable Respiratory status: spontaneous breathing, nonlabored ventilation and respiratory function stable Cardiovascular status: blood pressure returned to baseline and stable Postop Assessment: no apparent nausea or vomiting Anesthetic complications: no   No complications documented.  Last Vitals:  Vitals:   07/19/20 1241 07/19/20 1256  BP: 117/68 114/77  Pulse: 65 66  Resp: 19 17  Temp:  36.8 C  SpO2: 96% 96%    Last Pain:  Vitals:   07/19/20 1256  TempSrc:   PainSc: Asleep                 Tela Kotecki,E. Dhruti Ghuman

## 2020-07-19 NOTE — Transfer of Care (Signed)
Immediate Anesthesia Transfer of Care Note  Patient: [name] RISINGER Sr.  Procedure(s) Performed: REVISON/PLICATION OF ARTERIOVENOUS FISTULA LEFT (Left Arm Upper) INSERTION OF DIALYSIS CATHETER (Left Chest)  Patient Location: PACU  Anesthesia Type:MAC  Level of Consciousness: drowsy and patient cooperative  Airway & Oxygen Therapy: Patient Spontanous Breathing  Post-op Assessment: Report given to RN and Post -op Vital signs reviewed and stable  Post vital signs: Reviewed and stable  Last Vitals:  Vitals Value Taken Time  BP 122/65 07/19/20 1141  Temp 36.7 C 07/19/20 1141  Pulse 67 07/19/20 1148  Resp 27 07/19/20 1148  SpO2 95 % 07/19/20 1148  Vitals shown include unvalidated device data.  Last Pain:  Vitals:   07/19/20 1141  TempSrc:   PainSc: Asleep      Patients Stated Pain Goal: 2 (76/22/63 3354)  Complications: No complications documented.

## 2020-07-19 NOTE — Discharge Instructions (Signed)
° °  [facility] [place] ° °Discharge Instructions ° °AV Fistula or Graft Surgery for Dialysis Access ° °Please refer to the following instructions for your post-procedure care. Your surgeon or physician assistant will discuss any changes with you. ° °Activity ° °You may drive the day following your surgery, if you are comfortable and no longer taking prescription pain medication. Resume full activity as the soreness in your incision resolves. ° °Bathing/Showering ° °You may shower after you go home. Keep your incision dry for 48 hours. Do not soak in a bathtub, hot tub, or swim until the incision heals completely. You may not shower if you have a hemodialysis catheter. ° °Incision Care ° °Clean your incision with mild soap and water after 48 hours. Pat the area dry with a clean towel. You do not need a bandage unless otherwise instructed. Do not apply any ointments or creams to your incision. You may have skin glue on your incision. Do not peel it off. It will come off on its own in about one week. Your arm may swell a bit after surgery. To reduce swelling use pillows to elevate your arm so it is above your heart. Your doctor will tell you if you need to lightly wrap your arm with an ACE bandage. ° °Diet ° °Resume your normal diet. There are not special food restrictions following this procedure. In order to heal from your surgery, it is CRITICAL to get adequate nutrition. Your body requires vitamins, minerals, and protein. Vegetables are the best source of vitamins and minerals. Vegetables also provide the perfect balance of protein. Processed food has little nutritional value, so try to avoid this. ° °Medications ° °Resume taking all of your medications. If your incision is causing pain, you may take over-the counter pain relievers such as acetaminophen (Tylenol). If you were prescribed a stronger pain medication, please be aware these medications can cause nausea and constipation. Prevent  nausea by taking the medication with a snack or meal. Avoid constipation by drinking plenty of fluids and eating foods with high amount of fiber, such as fruits, vegetables, and grains. Do not take Tylenol if you are taking prescription pain medications. ° ° ° ° °Follow up °Your surgeon may want to see you in the office following your access surgery. If so, this will be arranged at the time of your surgery. ° °Please call us immediately for any of the following conditions: ° °Increased pain, redness, drainage (pus) from your incision site °Fever of 101 degrees or higher °Severe or worsening pain at your incision site °Hand pain or numbness. ° °Reduce your risk of vascular disease: ° °Stop smoking. If you would like help, call [facility] at 1-800-QUIT-NOW (1-800-784-8669) or [place] at 336-586-4000 ° °Manage your cholesterol °Maintain a desired weight °Control your diabetes °Keep your blood pressure down ° °Dialysis ° °It will take several weeks to several months for your new dialysis access to be ready for use. Your surgeon will determine when it is OK to use it. Your nephrologist will continue to direct your dialysis. You can continue to use your Permcath until your new access is ready for use. ° °If you have any questions, please call the office at [phone]. ° °

## 2020-07-19 NOTE — Op Note (Signed)
Patient name: [name] CARCAMO Sr. MRN: [id number] DOB: 16-Jan-1961 Sex: male  07/19/2020 Pre-operative Diagnosis: esrd Post-operative diagnosis:  Same Surgeon:  Erlene Quan C. Donzetta Matters, MD Assistant: Laurence Slate, PA Procedure Performed: 1.  Ultrasound and fluoroscopic guided placement of left internal jugular vein 23 cm tunneled dialysis catheter 2.  Revision of left arm AV fistula with plication  Indications: 59 year old male with end-stage renal disease.  He has 2 large areas of pseudoaneurysm with thinning skin and is indicated for repair and catheter placement.  An assistant was necessary to expedite the case with assistance with suction, retraction, assistance with anastomosis and wound closure.  Findings: Right IJ appeared to be mostly thrombosed.  I did attempt to cannulate this but was unable to pass a wire.  From the left side I was able to pass a wire centrally but it was very difficult to get a catheter to pass ultimately I had to use stiff wire was able to get a 23 cm catheter to the level of the SVC innominate junction.  The left arm fistula had 2 very large pseudoaneurysms.  After plication there was a very strong thrill and a palpable radial artery pulse in the wrist.   Procedure:  The patient was identified in the holding area and taken to the operating room where he was placed supine on the operative table and MAC anesthesia was induced.  He was sterilely prepped and draped in the neck bilaterally and his left upper extremity.  Timeout was called.  I began using ultrasound to identify the right IJ vein.  This appeared to be mostly occluded there was a very small vein which I was able to cannulate with an 18-gauge needle.  Unfortunately to cannulation zone is unable to pass a wire.  I removed the needle and held pressure.  I turned my attention to the left side.  I was able to cannulate the left IJ vein.  I did pass a wire but unfortunately would not go centrally.  I placed the dilator in  place and then was able to get the wire to pass centrally.  Ultimately applied placed an Amplatz wire down and dilated over this.  I tunneled initially 19 cm catheter from counterincision.  I placed the introducer sheath but this unfortunately stopped in the left innominate vein.  When I attempted to place the 19 cm catheter it also stopped and left innominate vein.  I attempted to use a Glidewire advantage to turn the catheter down but it would not.  Ultimately I did lose wire access.  I used ultrasound again and we cannulated the IJ.  Using fluoroscopic guidance I passed the wire centrally and then placed an introducer sheath.  I then placed the night 23 cm catheter and this again ended at the left innominate vein.  I used a Glidewire advantage was able to get the 23 cm catheter to turn down under fluoroscopy.  I then remove the wire.  It was in good positioning with a smooth curve at the shoulder.  I flushed with heparinized saline closed the neck incision with 4 Monocryl and sutured the catheter to the skin with 3-0 nylon.  The catheter was then locked with 1.9 cc of concentrated heparin in both ports.  Attention was then turned to the left upper extremity.  This is previously been anesthetized.  Elliptical type incision was made around the 2 pseudoaneurysms.  We dissected down and obtain proximal control and then dissected and obtained distal control.  I mobilized the entirety of the fistula.  There was one side branch which did have coils then I tied this off and divided.  When I had mobilized the entire fistula I then clamped it proximally and then distally and I removed the pseudoaneurysmal site.  I flushed with heparinized saline centrally and reclamped.  There was one side that had been although obliterated from pseudoaneurysm and I plicated this on the side with 6-0 Prolene suture.  I then filled the fistula with heparinized saline and plicated the fistula with 4-0 Prolene suture in a running mattress  fashion and tied to itself.  Prior to completion I did again fill it with heparinized saline allowed flushing of both directions.  Upon completion there was hemostasis.  There was good flow that was palpable and there was a palpable radial artery pulse the wrist.  I obtain hemostasis.  I debulked some of the tissue around the area of the pseudoaneurysm.  We irrigated the wound and then closed with a running layer of Vicryl followed by Monocryl.  Dermabond is placed to the level of skin.  He was awakened from anesthesia having tolerated procedure without any complication.  All counts were correct at completion.  EBL: 200 cc   Yobana Culliton C. Donzetta Matters, MD [facility] [place] Office: [phone] Pager: [phone]

## 2020-07-20 ENCOUNTER — Encounter (HOSPITAL_COMMUNITY): Payer: Self-pay | Admitting: Vascular Surgery

## 2020-08-14 ENCOUNTER — Encounter: Payer: Self-pay | Admitting: Physician Assistant

## 2020-08-14 ENCOUNTER — Other Ambulatory Visit: Payer: Self-pay

## 2020-08-14 ENCOUNTER — Ambulatory Visit (INDEPENDENT_AMBULATORY_CARE_PROVIDER_SITE_OTHER): Payer: Self-pay | Admitting: Physician Assistant

## 2020-08-14 VITALS — BP 149/90 | HR 101 | Temp 98.5°F | Resp 20 | Ht 73.0 in | Wt 264.2 lb

## 2020-08-14 DIAGNOSIS — N186 End stage renal disease: Secondary | ICD-10-CM

## 2020-08-14 DIAGNOSIS — Z992 Dependence on renal dialysis: Secondary | ICD-10-CM

## 2020-08-14 NOTE — Progress Notes (Signed)
    Postoperative Access Visit   History of Present Illness   [name] [name]. is a 59 y.o. year old male who presents for postoperative follow-up for: Ultrasound and fluoroscopic guided placement of left internal jugular vein 23 cm tunneled dialysis catheter and Revision of left arm AV fistula with plication by Dr. Donzetta Matters on 07/19/20. The patient's wounds are well healed.  The patient notes no steal symptoms.    He was dialyzing through a left IJ [facility] on MWF At the [facility] however they did start using his left AV fistula on 08/10/20. So far he says it is working well without any issues  Physical Examination   Vitals:   08/14/20 0902  BP: (!) 149/90  Pulse: (!) 101  Resp: 20  Temp: 98.5 F (36.9 C)  TempSrc: Temporal  SpO2: 98%  Weight: 264 lb 3.2 oz (119.8 kg)  Height: 6\' 1"  (1.854 m)   Body mass index is 34.86 kg/m. Cardiac Regular rate and rhythm Lungs  Non labored left arm Incisions are healed, 2+ radial pulse, hand grip is 5/5, sensation in digits is intact, palpable thrill, bruit can  be auscultated     Medical Decision Making   [name] [name]. is a 59 y.o. year old male who presents s/p Ultrasound and fluoroscopic guided placement of left internal jugular vein 23 cm tunneled dialysis catheter and Revision of left arm AV fistula with plication by Dr. Donzetta Matters on 07/19/20.   Patent is without signs or symptoms of steal syndrome  The patient's access has already been accessed as of 08/10/20 without any issues. Discussed with patient that would recommend making sure the fistula continues to be canalized without issues this week and then he can be scheduled to have [facility] removed  The patient's tunneled dialysis catheter can be removed when Nephrology is comfortable with the performance of the left AV fistula  The patient may follow up on a prn basis   Karoline Caldwell, PA-C [facility] [place] Office: [phone]  Clinic MD: Dr. Carlis Abbott

## 2021-04-23 ENCOUNTER — Ambulatory Visit: Payer: Medicare Other | Admitting: Internal Medicine

## 2021-10-29 ENCOUNTER — Ambulatory Visit: Payer: Medicare Other | Admitting: Internal Medicine

## 2021-10-29 ENCOUNTER — Encounter: Payer: Self-pay | Admitting: Internal Medicine

## 2021-11-12 ENCOUNTER — Encounter: Payer: Self-pay | Admitting: *Deleted

## 2021-12-06 ENCOUNTER — Emergency Department (HOSPITAL_COMMUNITY)
Admission: EM | Admit: 2021-12-06 | Discharge: 2021-12-06 | Disposition: A | Discharge status: Home / Self Care | Payer: Medicare Other | Attending: Emergency Medicine | Admitting: Emergency Medicine

## 2021-12-06 ENCOUNTER — Emergency Department (HOSPITAL_COMMUNITY): Payer: Medicare Other

## 2021-12-06 ENCOUNTER — Encounter (HOSPITAL_COMMUNITY): Payer: Self-pay

## 2021-12-06 ENCOUNTER — Other Ambulatory Visit: Payer: Self-pay

## 2021-12-06 DIAGNOSIS — Z794 Long term (current) use of insulin: Secondary | ICD-10-CM | POA: Diagnosis not present

## 2021-12-06 DIAGNOSIS — I509 Heart failure, unspecified: Secondary | ICD-10-CM | POA: Insufficient documentation

## 2021-12-06 DIAGNOSIS — E1122 Type 2 diabetes mellitus with diabetic chronic kidney disease: Secondary | ICD-10-CM | POA: Diagnosis not present

## 2021-12-06 DIAGNOSIS — R739 Hyperglycemia, unspecified: Secondary | ICD-10-CM

## 2021-12-06 DIAGNOSIS — E1165 Type 2 diabetes mellitus with hyperglycemia: Secondary | ICD-10-CM | POA: Insufficient documentation

## 2021-12-06 DIAGNOSIS — N186 End stage renal disease: Secondary | ICD-10-CM | POA: Insufficient documentation

## 2021-12-06 DIAGNOSIS — Z7984 Long term (current) use of oral hypoglycemic drugs: Secondary | ICD-10-CM | POA: Diagnosis not present

## 2021-12-06 DIAGNOSIS — L97419 Non-pressure chronic ulcer of right heel and midfoot with unspecified severity: Secondary | ICD-10-CM

## 2021-12-06 DIAGNOSIS — L539 Erythematous condition, unspecified: Secondary | ICD-10-CM | POA: Diagnosis present

## 2021-12-06 DIAGNOSIS — E13621 Other specified diabetes mellitus with foot ulcer: Secondary | ICD-10-CM

## 2021-12-06 DIAGNOSIS — Z7982 Long term (current) use of aspirin: Secondary | ICD-10-CM | POA: Insufficient documentation

## 2021-12-06 LAB — BASIC METABOLIC PANEL
Anion gap: 14 (ref 5–15)
BUN: 51 mg/dL — ABNORMAL HIGH (ref 6–20)
CO2: 27 mmol/L (ref 22–32)
Calcium: 8.4 mg/dL — ABNORMAL LOW (ref 8.9–10.3)
Chloride: 87 mmol/L — ABNORMAL LOW (ref 98–111)
Creatinine, Ser: 10.73 mg/dL — ABNORMAL HIGH (ref 0.61–1.24)
GFR, Estimated: 5 mL/min — ABNORMAL LOW (ref >60)
Glucose, Bld: 618 mg/dL (ref 70–99)
Potassium: 3.6 mmol/L (ref 3.5–5.1)
Sodium: 128 mmol/L — ABNORMAL LOW (ref 135–145)

## 2021-12-06 LAB — CBG MONITORING, ED
Glucose-Capillary: 321 mg/dL — ABNORMAL HIGH (ref 70–99)
Glucose-Capillary: >600 mg/dL (ref 70–99)
Glucose-Capillary: >600 mg/dL (ref 70–99)

## 2021-12-06 LAB — CBC WITH DIFFERENTIAL/PLATELET
Abs Immature Granulocytes: 0.02 10*3/uL (ref 0.00–0.07)
Basophils Absolute: 0.1 10*3/uL (ref 0.0–0.1)
Basophils Relative: 1 %
Eosinophils Absolute: 0.7 10*3/uL — ABNORMAL HIGH (ref 0.0–0.5)
Eosinophils Relative: 10 %
HCT: 36.6 % — ABNORMAL LOW (ref 39.0–52.0)
Hemoglobin: 12 g/dL — ABNORMAL LOW (ref 13.0–17.0)
Immature Granulocytes: 0 %
Lymphocytes Relative: 22 %
Lymphs Abs: 1.5 10*3/uL (ref 0.7–4.0)
MCH: 31.7 pg (ref 26.0–34.0)
MCHC: 32.8 g/dL (ref 30.0–36.0)
MCV: 96.8 fL (ref 80.0–100.0)
Monocytes Absolute: 1 10*3/uL (ref 0.1–1.0)
Monocytes Relative: 15 %
Neutro Abs: 3.4 10*3/uL (ref 1.7–7.7)
Neutrophils Relative %: 52 %
Platelets: 185 10*3/uL (ref 150–400)
RBC: 3.78 MIL/uL — ABNORMAL LOW (ref 4.22–5.81)
RDW: 15 % (ref 11.5–15.5)
WBC: 6.7 10*3/uL (ref 4.0–10.5)
nRBC: 0 % (ref 0.0–0.2)

## 2021-12-06 MED ORDER — SODIUM CHLORIDE 0.9 % IV SOLN
3.0000 g | Freq: Four times a day (QID) | INTRAVENOUS | Status: DC
  Administered 2021-12-06: 3 g via INTRAVENOUS
  Filled 2021-12-06: qty 8

## 2021-12-06 MED ORDER — AMOXICILLIN-POT CLAVULANATE 500-125 MG PO TABS: 1.0000 | ORAL_TABLET | Freq: Three times a day (TID) | ORAL | 0 refills | Status: DC

## 2021-12-06 MED ORDER — INSULIN ASPART 100 UNIT/ML IV SOLN
8.0000 [IU] | Freq: Once | INTRAVENOUS | Status: AC
  Administered 2021-12-06: 8 [IU] via INTRAVENOUS

## 2021-12-06 MED ORDER — SODIUM CHLORIDE 0.9 % IV BOLUS
250.0000 mL | Freq: Once | INTRAVENOUS | Status: AC
  Administered 2021-12-06: 250 mL via INTRAVENOUS

## 2021-12-06 MED ORDER — INSULIN ASPART 100 UNIT/ML IJ SOLN
10.0000 [IU] | Freq: Once | INTRAMUSCULAR | Status: AC
  Administered 2021-12-06: 10 [IU] via SUBCUTANEOUS

## 2021-12-06 MED ORDER — INSULIN ASPART 100 UNIT/ML IV SOLN: 10.0000 [IU] | Freq: Once | INTRAVENOUS | Status: DC

## 2021-12-06 NOTE — ED Triage Notes (Addendum)
Pt to ED by POV from home with c/o R foot sore which began a "few days ago". Pt is diabetic and states he has not been checking his CBGs. Pt reports that sore is bleeding, no bleeding noted in triage. Arrives A+O, VSS, NADN. Pt states he was told to come to the ED by the staff at his dialysis center. CBG is >600 in triage.

## 2021-12-06 NOTE — ED Provider Notes (Signed)
[facility] Provider Note   CSN: [id number] Arrival date & time: 12/06/21  0016     History Chief Complaint  Patient presents with   foot sore    [name] [name] Sr. is a 61 y.o. male.  Patient is a 61 year old male with past medical history of poorly controlled diabetes, end-stage renal disease on hemodialysis, hypertension, congestive heart failure.  Patient presenting today with complaints of a sore to his right foot.  This has been present for the past 2 weeks.  He was told by the dialysis nurse yesterday that he should come to the ER to be evaluated.  He denies to me he is having any fevers or chills.  He denies any injury or trauma to the foot.  Blood sugar in triage was greater than 600, but patient admits to eating M&Ms prior to presentation.  The history is provided by the patient.      Home Medications Prior to Admission medications   Medication Sig Start Date End Date Taking? Authorizing Provider  aspirin 81 MG chewable tablet Chew 81 mg by mouth daily. 09/01/18   [provider]  BD PEN NEEDLE NANO 2ND GEN 32G X 4 MM MISC USE AS DIRECTED 3 TIMES A DAY 07/12/20   [provider]  bisacodyl (DULCOLAX) 5 MG EC tablet Take 1 tablet (5 mg total) by mouth at bedtime as needed for mild constipation or moderate constipation. 08/16/19   Kinnie Feil, MD  carvedilol (COREG) 25 MG tablet Take 25 mg by mouth 2 (two) times daily. 08/31/18   [provider]  ENTRESTO 24-26 MG Take 1 tablet by mouth 2 (two) times daily. 07/29/18   [provider]  famotidine (PEPCID) 20 MG tablet Take 1 tablet (20 mg total) by mouth at bedtime. Patient taking differently: Take 20 mg by mouth 2 (two) times daily.  08/16/19   Kinnie Feil, MD  HUMULIN 70/30 (70-30) 100 UNIT/ML injection Inject 20 Units into the skin 2 (two) times daily with a meal.  08/31/18   [provider]  lidocaine-prilocaine (EMLA) cream Apply 1 application  topically every Monday, Wednesday, and Friday with hemodialysis.  08/17/18   [provider]  NOVOLOG FLEXPEN 100 UNIT/ML FlexPen SMARTSIG:0-30 Unit(s) SUB-Q 3 Times Daily 07/13/20   [provider]  oxyCODONE-acetaminophen (PERCOCET/ROXICET) 5-325 MG tablet Take 1 tablet by mouth every 6 (six) hours as needed. 07/19/20   Ulyses Amor, PA-C  sildenafil (VIAGRA) 100 MG tablet Take 100 mg by mouth daily as needed for erectile dysfunction.  09/02/18   [provider]  sucroferric oxyhydroxide (VELPHORO) 500 MG chewable tablet Chew 1,000 mg by mouth 3 (three) times daily with meals.    [provider]  TRESIBA FLEXTOUCH 100 UNIT/ML FlexTouch Pen SMARTSIG:0-30 Unit(s) SUB-Q Daily 07/16/20   [provider]      Allergies    Sulfa antibiotics    Review of Systems   Review of Systems  All other systems reviewed and are negative.  Physical Exam Updated Vital Signs BP (!) 167/86    Pulse 91    Temp 98.7 F (37.1 C)    Resp 18    Ht 6\' 1"  (1.854 m)    Wt 120 kg    SpO2 95%    BMI 34.90 kg/m  Physical Exam Vitals and nursing note reviewed.  Constitutional:      General: He is not in acute distress.    Appearance: He is well-developed. He  is not diaphoretic.  HENT:     Head: Normocephalic and atraumatic.  Cardiovascular:     Rate and Rhythm: Normal rate and regular rhythm.     Heart sounds: No murmur heard.   No friction rub.  Pulmonary:     Effort: Pulmonary effort is normal. No respiratory distress.     Breath sounds: Normal breath sounds. No wheezing or rales.  Abdominal:     General: Bowel sounds are normal. There is no distension.     Palpations: Abdomen is soft.     Tenderness: There is no abdominal tenderness.  Musculoskeletal:        General: Normal range of motion.     Cervical back: Normal range of motion and neck supple.     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  To the soft tissues overlying  the amputation, there is an area of callus with mild erythema.  There is no drainage.  Skin:    General: Skin is warm and dry.  Neurological:     Mental Status: He is alert and oriented to person, place, and time.     Coordination: Coordination normal.      ED Results / Procedures / Treatments   Labs (all labs ordered are listed, but only abnormal results are displayed) Labs Reviewed  CBG MONITORING, ED - Abnormal; Notable for the following components:      Result Value   Glucose-Capillary >600 (*)    All other components within normal limits  BASIC METABOLIC PANEL  CBC WITH DIFFERENTIAL/PLATELET    EKG None  Radiology No results found.  Procedures Procedures    Medications Ordered in ED Medications  insulin aspart (novoLOG) injection 10 Units (has no administration in time range)    ED Course/ Medical Decision Making/ A&P  This patient presents to the ED for concern of sore to the right foot and elevated blood sugar, this involves an extensive number of treatment options, and is a complaint that carries with it a high risk of complications and morbidity.  The differential diagnosis includes osteomyelitis, cellulitis, diabetic foot ulcer, diabetic ketoacidosis, hyperglycemia, nonketotic hyperosmolar state   Co morbidities that complicate the patient evaluation  End-stage renal disease on hemodialysis   Additional history obtained:  No additional historians necessary and no outside records needed for review   Lab Tests:  I Ordered, and personally interpreted labs.  The pertinent results include: CBC, basic metabolic panel.  Initial blood sugar was over 600, but no evidence for DKA.   Imaging Studies ordered:  I ordered imaging studies including x-rays of the foot I independently visualized and interpreted imaging which showed no evidence for osteomyelitis I agree with the radiologist interpretation   Cardiac Monitoring:  No cardiac monitoring  performed   Medicines ordered and prescription drug management:  I ordered medication including insulin for hyperglycemia and Unasyn for possible infected foot ulcer Reevaluation of the patient after these medicines showed that the patient improved I have reviewed the patients home medicines and have made adjustments as needed   Test Considered:  No other test considered were ordered   Critical Interventions:  IV fluids and insulin   Consultations Obtained:  No consultations required   Problem List / ED Course:  Patient presenting here at the request of the dialysis team for evaluation of a foot sore.  Upon patient arrival, his blood sugar was found to be over 600.  Laboratory studies were initiated and showed  no leukocytosis.  Patient is not febrile and is otherwise well-appearing.  His metabolic panel shows a sugar of 600, but no evidence for DKA.  He was given subcu insulin with no improvement followed by 250 cc of normal saline and 8 units of intravenous insulin.  Blood sugar is now 320. I also gave Unasyn for what could be the beginning of a foot ulcer.  I will have patient do local wound care and have this wound followed up in the near future.  He will be prescribed Augmentin.   Reevaluation:  After the interventions noted above, I reevaluated the patient and found that they have :improved   Social Determinants of Health:  None   Dispostion:  After consideration of the diagnostic results and the patients response to treatment, I feel that the patent would benefit from discharge with antibiotics, local wound care, and close follow-up.    Final Clinical Impression(s) / ED Diagnoses Final diagnoses:  None    Rx / DC Orders ED Discharge Orders     None         Veryl Speak, MD 12/06/21 0425

## 2021-12-06 NOTE — Discharge Instructions (Signed)
Begin taking Augmentin as prescribed.  Follow-up with your primary doctor in the next 3 to 4 days for a recheck, and return to the ER if you develop worsening pain, high fevers, drainage from the foot, or for other new and concerning symptoms.

## 2021-12-30 ENCOUNTER — Ambulatory Visit: Payer: Medicare Other

## 2021-12-30 ENCOUNTER — Encounter: Payer: Self-pay | Admitting: *Deleted

## 2021-12-30 ENCOUNTER — Telehealth: Payer: Self-pay | Admitting: *Deleted

## 2021-12-30 NOTE — Telephone Encounter (Signed)
Tried to call pt for 3:00 nurse visit by telephone.  No answer on home phone.  Lmom of cell phone for pt to call back.

## 2022-01-29 ENCOUNTER — Other Ambulatory Visit: Payer: Self-pay

## 2022-01-29 ENCOUNTER — Other Ambulatory Visit (HOSPITAL_COMMUNITY): Payer: Self-pay | Admitting: Vascular Surgery

## 2022-01-29 ENCOUNTER — Encounter: Payer: Self-pay | Admitting: Vascular Surgery

## 2022-01-29 ENCOUNTER — Ambulatory Visit (INDEPENDENT_AMBULATORY_CARE_PROVIDER_SITE_OTHER): Payer: Medicare Other | Admitting: Vascular Surgery

## 2022-01-29 ENCOUNTER — Ambulatory Visit (INDEPENDENT_AMBULATORY_CARE_PROVIDER_SITE_OTHER): Payer: Medicare Other

## 2022-01-29 VITALS — BP 160/84 | HR 97 | Ht 73.0 in | Wt 267.0 lb

## 2022-01-29 DIAGNOSIS — N186 End stage renal disease: Secondary | ICD-10-CM

## 2022-01-29 DIAGNOSIS — Z992 Dependence on renal dialysis: Secondary | ICD-10-CM

## 2022-01-29 DIAGNOSIS — S91301A Unspecified open wound, right foot, initial encounter: Secondary | ICD-10-CM | POA: Diagnosis not present

## 2022-01-29 DIAGNOSIS — I739 Peripheral vascular disease, unspecified: Secondary | ICD-10-CM | POA: Diagnosis not present

## 2022-01-29 NOTE — Progress Notes (Signed)
? ? ?[facility] [place] ? ?Patient name: [name] [name]. MRN: [id number] DOB: 09/14/1961 Sex: male ? ?REASON FOR CONSULT: Evaluation of arterial flow to lower extremities ? ?HPI: ?[name] [name]. is a 61 y.o. male, who is here today for evaluation of lower extremity arterial flow with a right foot wound.  He is end-stage renal disease and has dialysis via a left upper arm AV fistula.  He reports that he has been on dialysis for 3 to 4 years.  He does have a history of right fifth toe infection and had amputation and skin grafting of this in 2004.  This was done in [place].  Over approximately the last 2 to 3 months he had blistering on the plantar surface of his foot over the lateral aspect and has had slow healing of this.  He is seeing [place] today for evaluation of lower extremity flow.  He denies any claudication or rest pain.  He does have neuropathy in both feet. ? ?Past Medical History:  ?Diagnosis Date  ? CHF (congestive heart failure) ([place])   ? Diabetes mellitus without complication ([place])   ? type 2  ? ESRD on hemodialysis ([place])   ? mon wed fri  ? Hypertension   ? Kidney failure   ? Myocardial infarction [facility])   ? Nonischemic cardiomyopathy ([place])   ? ? ?Family History  ?Problem Relation Age of Onset  ? Hypertension Mother   ? ? ?SOCIAL HISTORY: ?Social History  ? ?Socioeconomic History  ? Marital status: Divorced  ?  Spouse name: Not on file  ? Number of children: Not on file  ? Years of education: Not on file  ? Highest education level: Not on file  ?Occupational History  ? Not on file  ?Tobacco Use  ? Smoking status: Never  ? Smokeless tobacco: Never  ?Vaping Use  ? Vaping Use: Never used  ?Substance and Sexual Activity  ? Alcohol use: Not Currently  ? Drug use: Never  ? Sexual activity: Not on file  ?Other Topics Concern  ? Not on file  ?Social History Narrative  ? Not on file  ? ?Social Determinants of Health  ? ?Financial Resource  Strain: Not on file  ?Food Insecurity: Not on file  ?Transportation Needs: Not on file  ?Physical Activity: Not on file  ?Stress: Not on file  ?Social Connections: Not on file  ?Intimate Partner Violence: Not on file  ? ? ?Allergies  ?Allergen Reactions  ? Sulfa Antibiotics Nausea And Vomiting  ? ? ?Current Outpatient Medications  ?Medication Sig Dispense Refill  ? aspirin 81 MG chewable tablet Chew 81 mg by mouth daily.  1  ? BD PEN NEEDLE NANO 2ND GEN 32G X 4 MM MISC USE AS DIRECTED 3 TIMES A DAY    ? bisacodyl (DULCOLAX) 5 MG EC tablet Take 1 tablet (5 mg total) by mouth at bedtime as needed for mild constipation or moderate constipation. 30 tablet 0  ? famotidine (PEPCID) 20 MG tablet Take 1 tablet (20 mg total) by mouth at bedtime. (Patient taking differently: Take 20 mg by mouth 2 (two) times daily.)    ? HUMULIN 70/30 (70-30) 100 UNIT/ML injection Inject 20 Units into the skin 2 (two) times daily with a meal.   12  ? lidocaine-prilocaine (EMLA) cream Apply 1 application topically every Monday, Wednesday, and Friday with hemodialysis.   3  ? NOVOLOG FLEXPEN 100 UNIT/ML FlexPen SMARTSIG:0-30 Unit(s) SUB-Q 3 Times Daily    ?  sucroferric oxyhydroxide (VELPHORO) 500 MG chewable tablet Chew 1,000 mg by mouth 3 (three) times daily with meals.    ? TRESIBA FLEXTOUCH 100 UNIT/ML FlexTouch Pen SMARTSIG:0-30 Unit(s) SUB-Q Daily    ? oxyCODONE-acetaminophen (PERCOCET/ROXICET) 5-325 MG tablet Take 1 tablet by mouth every 6 (six) hours as needed. (Patient not taking: Reported on 01/29/2022) 10 tablet 0  ? sildenafil (VIAGRA) 100 MG tablet Take 100 mg by mouth daily as needed for erectile dysfunction.  (Patient not taking: Reported on 01/29/2022)  0  ? ?No current facility-administered medications for this visit.  ? ? ?REVIEW OF SYSTEMS:  ?[X]  denotes positive finding, [ ]  denotes negative finding ?Cardiac  Comments:  ?Chest pain or chest pressure:    ?Shortness of breath upon exertion:    ?Short of breath when lying flat:     ?Irregular heart rhythm:    ?    ?Vascular    ?Pain in calf, thigh, or hip brought on by ambulation:    ?Pain in feet at night that wakes you up from your sleep:     ?Blood clot in your veins:    ?Leg swelling:     ?    ?Pulmonary    ?Oxygen at home:    ?Productive cough:     ?Wheezing:     ?    ?Neurologic    ?Sudden weakness in arms or legs:     ?Sudden numbness in arms or legs:     ?Sudden onset of difficulty speaking or slurred speech:    ?Temporary loss of vision in one eye:     ?Problems with dizziness:     ?    ?Gastrointestinal    ?Blood in stool:     ?Vomited blood:     ?    ?Genitourinary    ?Burning when urinating:     ?Blood in urine:    ?    ?Psychiatric    ?Major depression:     ?    ?Hematologic    ?Bleeding problems:    ?Problems with blood clotting too easily:    ?    ?Skin    ?Rashes or ulcers:    ?    ?Constitutional    ?Fever or chills:    ? ? ?PHYSICAL EXAM: ?Vitals:  ? 01/29/22 0902  ?BP: (!) 160/84  ?Pulse: 97  ?Weight: 267 lb (121.1 kg)  ?Height: 6\' 1"  (1.854 m)  ? ? ?GENERAL: The patient is a well-nourished male, in no acute distress. The vital signs are documented above. ?CARDIOVASCULAR: He does have 2+ dorsalis pedis pulses bilaterally ?PULMONARY: There is good air exchange  ?MUSCULOSKELETAL: There are no major deformities or cyanosis. ?NEUROLOGIC: No focal weakness or paresthesias are detected. ?SKIN: He has a prior right fifth toe amputation and skin grafting over the resulting deficits.  This is all well-healed.  He does have a callus and central ulceration over the plantar aspect of his fifth metatarsal bone from the old resection site.  There is no fluctuance. ?PSYCHIATRIC: The patient has a normal affect. ? ?DATA:  ?Noninvasive studies today reveal some medial calcification but normal triphasic flow bilaterally and normal toe brachial indices bilaterally ? ?MEDICAL ISSUES: ?Discussed this at length with the patient.  I explained that he does not have any evidence of significant  lower extremity arterial insufficiency.  Feel that this is related to pressure over the old metatarsal bone resection site.  He has had slow healing after initially having a blister at this  area.  I have suggested referral to Dr. Meridee Score to determine if conservative padding versus surgical resection is indicated.  Discussed this at length with the patient who understands.  We will make this referral.  He will see [place] again on an as-needed basis ? ? ?[name], MD FACS ?[facility] [place] ?Office Tel [phone] ?Pager [phone] ? ?Note: Portions of this report may have been transcribed using voice recognition software.  Every effort has been made to ensure accuracy; however, inadvertent computerized transcription errors may still be present. ? ?

## 2022-02-02 ENCOUNTER — Emergency Department (HOSPITAL_COMMUNITY): Payer: Medicare Other

## 2022-02-02 ENCOUNTER — Inpatient Hospital Stay (HOSPITAL_COMMUNITY): Payer: Medicare Other

## 2022-02-02 ENCOUNTER — Encounter (HOSPITAL_COMMUNITY)
Admission: EM | Disposition: A | Discharge status: Skilled Nursing Facility | Payer: Self-pay | Source: Home / Self Care | Attending: Internal Medicine

## 2022-02-02 ENCOUNTER — Encounter (HOSPITAL_COMMUNITY): Payer: Self-pay

## 2022-02-02 ENCOUNTER — Inpatient Hospital Stay (HOSPITAL_COMMUNITY): Payer: Medicare Other | Admitting: Anesthesiology

## 2022-02-02 ENCOUNTER — Inpatient Hospital Stay (HOSPITAL_COMMUNITY)
Admission: EM | Admit: 2022-02-02 | Discharge: 2022-02-20 | DRG: 853 | Disposition: A | Discharge status: Skilled Nursing Facility | Payer: Medicare Other | Attending: Internal Medicine | Admitting: Internal Medicine

## 2022-02-02 ENCOUNTER — Other Ambulatory Visit: Payer: Self-pay

## 2022-02-02 DIAGNOSIS — R112 Nausea with vomiting, unspecified: Secondary | ICD-10-CM | POA: Diagnosis present

## 2022-02-02 DIAGNOSIS — I6340 Cerebral infarction due to embolism of unspecified cerebral artery: Secondary | ICD-10-CM | POA: Diagnosis present

## 2022-02-02 DIAGNOSIS — Z992 Dependence on renal dialysis: Secondary | ICD-10-CM

## 2022-02-02 DIAGNOSIS — I132 Hypertensive heart and chronic kidney disease with heart failure and with stage 5 chronic kidney disease, or end stage renal disease: Secondary | ICD-10-CM | POA: Diagnosis present

## 2022-02-02 DIAGNOSIS — G928 Other toxic encephalopathy: Secondary | ICD-10-CM | POA: Diagnosis present

## 2022-02-02 DIAGNOSIS — E119 Type 2 diabetes mellitus without complications: Secondary | ICD-10-CM

## 2022-02-02 DIAGNOSIS — I509 Heart failure, unspecified: Secondary | ICD-10-CM | POA: Diagnosis not present

## 2022-02-02 DIAGNOSIS — Z794 Long term (current) use of insulin: Secondary | ICD-10-CM

## 2022-02-02 DIAGNOSIS — M86171 Other acute osteomyelitis, right ankle and foot: Secondary | ICD-10-CM | POA: Diagnosis present

## 2022-02-02 DIAGNOSIS — D631 Anemia in chronic kidney disease: Secondary | ICD-10-CM

## 2022-02-02 DIAGNOSIS — T402X5A Adverse effect of other opioids, initial encounter: Secondary | ICD-10-CM | POA: Diagnosis present

## 2022-02-02 DIAGNOSIS — G9340 Encephalopathy, unspecified: Secondary | ICD-10-CM | POA: Diagnosis not present

## 2022-02-02 DIAGNOSIS — R7881 Bacteremia: Secondary | ICD-10-CM

## 2022-02-02 DIAGNOSIS — A480 Gas gangrene: Secondary | ICD-10-CM | POA: Diagnosis present

## 2022-02-02 DIAGNOSIS — Z6835 Body mass index (BMI) 35.0-35.9, adult: Secondary | ICD-10-CM

## 2022-02-02 DIAGNOSIS — T82858A Stenosis of vascular prosthetic devices, implants and grafts, initial encounter: Secondary | ICD-10-CM | POA: Diagnosis present

## 2022-02-02 DIAGNOSIS — I6521 Occlusion and stenosis of right carotid artery: Secondary | ICD-10-CM | POA: Diagnosis present

## 2022-02-02 DIAGNOSIS — Z7982 Long term (current) use of aspirin: Secondary | ICD-10-CM

## 2022-02-02 DIAGNOSIS — I959 Hypotension, unspecified: Secondary | ICD-10-CM

## 2022-02-02 DIAGNOSIS — I639 Cerebral infarction, unspecified: Secondary | ICD-10-CM

## 2022-02-02 DIAGNOSIS — L089 Local infection of the skin and subcutaneous tissue, unspecified: Secondary | ICD-10-CM | POA: Diagnosis not present

## 2022-02-02 DIAGNOSIS — I252 Old myocardial infarction: Secondary | ICD-10-CM

## 2022-02-02 DIAGNOSIS — E8729 Other acidosis: Secondary | ICD-10-CM | POA: Diagnosis not present

## 2022-02-02 DIAGNOSIS — E871 Hypo-osmolality and hyponatremia: Secondary | ICD-10-CM

## 2022-02-02 DIAGNOSIS — E669 Obesity, unspecified: Secondary | ICD-10-CM

## 2022-02-02 DIAGNOSIS — M86671 Other chronic osteomyelitis, right ankle and foot: Secondary | ICD-10-CM | POA: Diagnosis not present

## 2022-02-02 DIAGNOSIS — K219 Gastro-esophageal reflux disease without esophagitis: Secondary | ICD-10-CM

## 2022-02-02 DIAGNOSIS — A419 Sepsis, unspecified organism: Secondary | ICD-10-CM | POA: Diagnosis present

## 2022-02-02 DIAGNOSIS — J9601 Acute respiratory failure with hypoxia: Secondary | ICD-10-CM

## 2022-02-02 DIAGNOSIS — Z8673 Personal history of transient ischemic attack (TIA), and cerebral infarction without residual deficits: Secondary | ICD-10-CM

## 2022-02-02 DIAGNOSIS — N2581 Secondary hyperparathyroidism of renal origin: Secondary | ICD-10-CM | POA: Diagnosis present

## 2022-02-02 DIAGNOSIS — T82898A Other specified complication of vascular prosthetic devices, implants and grafts, initial encounter: Secondary | ICD-10-CM | POA: Diagnosis not present

## 2022-02-02 DIAGNOSIS — E11628 Type 2 diabetes mellitus with other skin complications: Secondary | ICD-10-CM

## 2022-02-02 DIAGNOSIS — Z20822 Contact with and (suspected) exposure to covid-19: Secondary | ICD-10-CM | POA: Diagnosis present

## 2022-02-02 DIAGNOSIS — I10 Essential (primary) hypertension: Secondary | ICD-10-CM | POA: Diagnosis present

## 2022-02-02 DIAGNOSIS — E1169 Type 2 diabetes mellitus with other specified complication: Secondary | ICD-10-CM | POA: Diagnosis not present

## 2022-02-02 DIAGNOSIS — E441 Mild protein-calorie malnutrition: Secondary | ICD-10-CM | POA: Diagnosis present

## 2022-02-02 DIAGNOSIS — E1165 Type 2 diabetes mellitus with hyperglycemia: Secondary | ICD-10-CM | POA: Diagnosis present

## 2022-02-02 DIAGNOSIS — N186 End stage renal disease: Secondary | ICD-10-CM | POA: Diagnosis present

## 2022-02-02 DIAGNOSIS — G9341 Metabolic encephalopathy: Secondary | ICD-10-CM | POA: Diagnosis not present

## 2022-02-02 DIAGNOSIS — I447 Left bundle-branch block, unspecified: Secondary | ICD-10-CM | POA: Diagnosis present

## 2022-02-02 DIAGNOSIS — E11621 Type 2 diabetes mellitus with foot ulcer: Secondary | ICD-10-CM | POA: Diagnosis present

## 2022-02-02 DIAGNOSIS — E872 Acidosis, unspecified: Secondary | ICD-10-CM | POA: Diagnosis present

## 2022-02-02 DIAGNOSIS — N189 Chronic kidney disease, unspecified: Secondary | ICD-10-CM | POA: Diagnosis not present

## 2022-02-02 DIAGNOSIS — E11649 Type 2 diabetes mellitus with hypoglycemia without coma: Secondary | ICD-10-CM | POA: Diagnosis not present

## 2022-02-02 DIAGNOSIS — M86271 Subacute osteomyelitis, right ankle and foot: Secondary | ICD-10-CM | POA: Diagnosis present

## 2022-02-02 DIAGNOSIS — Z9841 Cataract extraction status, right eye: Secondary | ICD-10-CM

## 2022-02-02 DIAGNOSIS — B952 Enterococcus as the cause of diseases classified elsewhere: Secondary | ICD-10-CM | POA: Diagnosis present

## 2022-02-02 DIAGNOSIS — Y832 Surgical operation with anastomosis, bypass or graft as the cause of abnormal reaction of the patient, or of later complication, without mention of misadventure at the time of the procedure: Secondary | ICD-10-CM | POA: Diagnosis present

## 2022-02-02 DIAGNOSIS — Z89421 Acquired absence of other right toe(s): Secondary | ICD-10-CM

## 2022-02-02 DIAGNOSIS — K5641 Fecal impaction: Secondary | ICD-10-CM | POA: Diagnosis not present

## 2022-02-02 DIAGNOSIS — M869 Osteomyelitis, unspecified: Secondary | ICD-10-CM | POA: Diagnosis not present

## 2022-02-02 DIAGNOSIS — E1122 Type 2 diabetes mellitus with diabetic chronic kidney disease: Secondary | ICD-10-CM | POA: Diagnosis present

## 2022-02-02 DIAGNOSIS — I6529 Occlusion and stenosis of unspecified carotid artery: Secondary | ICD-10-CM

## 2022-02-02 DIAGNOSIS — E1152 Type 2 diabetes mellitus with diabetic peripheral angiopathy with gangrene: Secondary | ICD-10-CM | POA: Diagnosis present

## 2022-02-02 DIAGNOSIS — I38 Endocarditis, valve unspecified: Secondary | ICD-10-CM | POA: Diagnosis not present

## 2022-02-02 DIAGNOSIS — Z881 Allergy status to other antibiotic agents status: Secondary | ICD-10-CM

## 2022-02-02 DIAGNOSIS — B9562 Methicillin resistant Staphylococcus aureus infection as the cause of diseases classified elsewhere: Secondary | ICD-10-CM

## 2022-02-02 DIAGNOSIS — R6520 Severe sepsis without septic shock: Secondary | ICD-10-CM | POA: Diagnosis present

## 2022-02-02 DIAGNOSIS — R410 Disorientation, unspecified: Secondary | ICD-10-CM | POA: Diagnosis present

## 2022-02-02 DIAGNOSIS — I952 Hypotension due to drugs: Secondary | ICD-10-CM | POA: Diagnosis not present

## 2022-02-02 DIAGNOSIS — L03115 Cellulitis of right lower limb: Secondary | ICD-10-CM | POA: Diagnosis present

## 2022-02-02 DIAGNOSIS — E875 Hyperkalemia: Secondary | ICD-10-CM

## 2022-02-02 DIAGNOSIS — L03119 Cellulitis of unspecified part of limb: Secondary | ICD-10-CM

## 2022-02-02 DIAGNOSIS — M7989 Other specified soft tissue disorders: Secondary | ICD-10-CM | POA: Diagnosis not present

## 2022-02-02 DIAGNOSIS — I5022 Chronic systolic (congestive) heart failure: Secondary | ICD-10-CM | POA: Diagnosis present

## 2022-02-02 DIAGNOSIS — Y92239 Unspecified place in hospital as the place of occurrence of the external cause: Secondary | ICD-10-CM | POA: Diagnosis present

## 2022-02-02 DIAGNOSIS — L97519 Non-pressure chronic ulcer of other part of right foot with unspecified severity: Secondary | ICD-10-CM | POA: Diagnosis present

## 2022-02-02 DIAGNOSIS — Z8249 Family history of ischemic heart disease and other diseases of the circulatory system: Secondary | ICD-10-CM

## 2022-02-02 DIAGNOSIS — S90821A Blister (nonthermal), right foot, initial encounter: Secondary | ICD-10-CM | POA: Diagnosis present

## 2022-02-02 DIAGNOSIS — I5023 Acute on chronic systolic (congestive) heart failure: Secondary | ICD-10-CM | POA: Diagnosis present

## 2022-02-02 DIAGNOSIS — R4182 Altered mental status, unspecified: Secondary | ICD-10-CM | POA: Diagnosis not present

## 2022-02-02 DIAGNOSIS — E785 Hyperlipidemia, unspecified: Secondary | ICD-10-CM | POA: Diagnosis present

## 2022-02-02 DIAGNOSIS — Z79899 Other long term (current) drug therapy: Secondary | ICD-10-CM

## 2022-02-02 DIAGNOSIS — K2100 Gastro-esophageal reflux disease with esophagitis, without bleeding: Secondary | ICD-10-CM | POA: Diagnosis not present

## 2022-02-02 HISTORY — PX: I & D EXTREMITY: SHX5045

## 2022-02-02 HISTORY — PX: AMPUTATION: SHX166

## 2022-02-02 LAB — CBC
HCT: 37 % — ABNORMAL LOW (ref 39.0–52.0)
Hemoglobin: 11.8 g/dL — ABNORMAL LOW (ref 13.0–17.0)
MCH: 30.6 pg (ref 26.0–34.0)
MCHC: 31.9 g/dL (ref 30.0–36.0)
MCV: 96.1 fL (ref 80.0–100.0)
Platelets: 155 10*3/uL (ref 150–400)
RBC: 3.85 MIL/uL — ABNORMAL LOW (ref 4.22–5.81)
RDW: 16.1 % — ABNORMAL HIGH (ref 11.5–15.5)
WBC: 12.4 10*3/uL — ABNORMAL HIGH (ref 4.0–10.5)
nRBC: 0 % (ref 0.0–0.2)

## 2022-02-02 LAB — PREALBUMIN
Prealbumin: 10.9 mg/dL — ABNORMAL LOW (ref 18–38)
Prealbumin: 9.5 mg/dL — ABNORMAL LOW (ref 18–38)

## 2022-02-02 LAB — GLUCOSE, CAPILLARY
Glucose-Capillary: 173 mg/dL — ABNORMAL HIGH (ref 70–99)
Glucose-Capillary: 213 mg/dL — ABNORMAL HIGH (ref 70–99)
Glucose-Capillary: 229 mg/dL — ABNORMAL HIGH (ref 70–99)
Glucose-Capillary: 238 mg/dL — ABNORMAL HIGH (ref 70–99)
Glucose-Capillary: 326 mg/dL — ABNORMAL HIGH (ref 70–99)
Glucose-Capillary: 328 mg/dL — ABNORMAL HIGH (ref 70–99)

## 2022-02-02 LAB — COMPREHENSIVE METABOLIC PANEL
ALT: 18 U/L (ref 0–44)
ALT: 18 U/L (ref 0–44)
AST: 25 U/L (ref 15–41)
AST: 29 U/L (ref 15–41)
Albumin: 3 g/dL — ABNORMAL LOW (ref 3.5–5.0)
Albumin: 2.9 g/dL — ABNORMAL LOW (ref 3.5–5.0)
Alkaline Phosphatase: 76 U/L (ref 38–126)
Alkaline Phosphatase: 71 U/L (ref 38–126)
Anion gap: 13 (ref 5–15)
Anion gap: 15 (ref 5–15)
BUN: 42 mg/dL — ABNORMAL HIGH (ref 6–20)
BUN: 43 mg/dL — ABNORMAL HIGH (ref 6–20)
CO2: 29 mmol/L (ref 22–32)
CO2: 26 mmol/L (ref 22–32)
Calcium: 9 mg/dL (ref 8.9–10.3)
Calcium: 8.7 mg/dL — ABNORMAL LOW (ref 8.9–10.3)
Chloride: 87 mmol/L — ABNORMAL LOW (ref 98–111)
Chloride: 90 mmol/L — ABNORMAL LOW (ref 98–111)
Creatinine, Ser: 11.09 mg/dL — ABNORMAL HIGH (ref 0.61–1.24)
Creatinine, Ser: 11.09 mg/dL — ABNORMAL HIGH (ref 0.61–1.24)
GFR, Estimated: 5 mL/min — ABNORMAL LOW (ref >60)
GFR, Estimated: 5 mL/min — ABNORMAL LOW (ref >60)
Glucose, Bld: 479 mg/dL — ABNORMAL HIGH (ref 70–99)
Glucose, Bld: 376 mg/dL — ABNORMAL HIGH (ref 70–99)
Potassium: 3.5 mmol/L (ref 3.5–5.1)
Potassium: 3.6 mmol/L (ref 3.5–5.1)
Sodium: 129 mmol/L — ABNORMAL LOW (ref 135–145)
Sodium: 131 mmol/L — ABNORMAL LOW (ref 135–145)
Total Bilirubin: 0.6 mg/dL (ref 0.3–1.2)
Total Bilirubin: 0.8 mg/dL (ref 0.3–1.2)
Total Protein: 8 g/dL (ref 6.5–8.1)
Total Protein: 7.5 g/dL (ref 6.5–8.1)

## 2022-02-02 LAB — CBC WITH DIFFERENTIAL/PLATELET
Abs Immature Granulocytes: 0.07 10*3/uL (ref 0.00–0.07)
Basophils Absolute: 0.1 10*3/uL (ref 0.0–0.1)
Basophils Relative: 0 %
Eosinophils Absolute: 0 10*3/uL (ref 0.0–0.5)
Eosinophils Relative: 0 %
HCT: 37.1 % — ABNORMAL LOW (ref 39.0–52.0)
Hemoglobin: 12 g/dL — ABNORMAL LOW (ref 13.0–17.0)
Immature Granulocytes: 1 %
Lymphocytes Relative: 7 %
Lymphs Abs: 0.9 10*3/uL (ref 0.7–4.0)
MCH: 31 pg (ref 26.0–34.0)
MCHC: 32.3 g/dL (ref 30.0–36.0)
MCV: 95.9 fL (ref 80.0–100.0)
Monocytes Absolute: 1.9 10*3/uL — ABNORMAL HIGH (ref 0.1–1.0)
Monocytes Relative: 16 %
Neutro Abs: 9.1 10*3/uL — ABNORMAL HIGH (ref 1.7–7.7)
Neutrophils Relative %: 76 %
Platelets: 171 10*3/uL (ref 150–400)
RBC: 3.87 MIL/uL — ABNORMAL LOW (ref 4.22–5.81)
RDW: 15.9 % — ABNORMAL HIGH (ref 11.5–15.5)
WBC: 12 10*3/uL — ABNORMAL HIGH (ref 4.0–10.5)
nRBC: 0 % (ref 0.0–0.2)

## 2022-02-02 LAB — PROTIME-INR
INR: 1.3 — ABNORMAL HIGH (ref 0.8–1.2)
Prothrombin Time: 15.8 seconds — ABNORMAL HIGH (ref 11.4–15.2)

## 2022-02-02 LAB — HEMOGLOBIN A1C
Hgb A1c MFr Bld: 11.9 % — ABNORMAL HIGH (ref 4.8–5.6)
Hgb A1c MFr Bld: 12.2 % — ABNORMAL HIGH (ref 4.8–5.6)
Mean Plasma Glucose: 294.83 mg/dL
Mean Plasma Glucose: 303.44 mg/dL

## 2022-02-02 LAB — RESP PANEL BY RT-PCR (FLU A&B, COVID) ARPGX2
Influenza A by PCR: NEGATIVE
Influenza B by PCR: NEGATIVE
SARS Coronavirus 2 by RT PCR: NEGATIVE

## 2022-02-02 LAB — LACTIC ACID, PLASMA
Lactic Acid, Venous: 3.1 mmol/L (ref 0.5–1.9)
Lactic Acid, Venous: 2.2 mmol/L (ref 0.5–1.9)
Lactic Acid, Venous: 2.5 mmol/L (ref 0.5–1.9)

## 2022-02-02 LAB — HEPATITIS B SURFACE ANTIBODY,QUALITATIVE: Hep B S Ab: REACTIVE — AB

## 2022-02-02 LAB — CBG MONITORING, ED
Glucose-Capillary: 468 mg/dL — ABNORMAL HIGH (ref 70–99)
Glucose-Capillary: 352 mg/dL — ABNORMAL HIGH (ref 70–99)
Glucose-Capillary: 367 mg/dL — ABNORMAL HIGH (ref 70–99)

## 2022-02-02 LAB — C-REACTIVE PROTEIN
CRP: 29.8 mg/dL — ABNORMAL HIGH (ref <1.0)
CRP: 33.6 mg/dL — ABNORMAL HIGH (ref <1.0)

## 2022-02-02 LAB — SURGICAL PCR SCREEN
MRSA, PCR: NEGATIVE
Staphylococcus aureus: POSITIVE — AB

## 2022-02-02 LAB — SEDIMENTATION RATE: Sed Rate: 85 mm/hr — ABNORMAL HIGH (ref 0–16)

## 2022-02-02 LAB — POC OCCULT BLOOD, ED: Fecal Occult Bld: NEGATIVE

## 2022-02-02 LAB — MAGNESIUM: Magnesium: 1.7 mg/dL (ref 1.7–2.4)

## 2022-02-02 LAB — HIV ANTIBODY (ROUTINE TESTING W REFLEX): HIV Screen 4th Generation wRfx: NONREACTIVE

## 2022-02-02 LAB — HEPATITIS B SURFACE ANTIGEN: Hepatitis B Surface Ag: NONREACTIVE

## 2022-02-02 LAB — APTT: aPTT: 31 seconds (ref 24–36)

## 2022-02-02 LAB — ETHANOL: Alcohol, Ethyl (B): <10 mg/dL (ref <10)

## 2022-02-02 SURGERY — IRRIGATION AND DEBRIDEMENT EXTREMITY
Anesthesia: Monitor Anesthesia Care | Laterality: Right

## 2022-02-02 MED ORDER — INSULIN GLARGINE-YFGN 100 UNIT/ML ~~LOC~~ SOLN
20.0000 [IU] | Freq: Every day | SUBCUTANEOUS | Status: DC
Start: 2022-02-02 — End: 2022-02-02
  Filled 2022-02-02: qty 0.2

## 2022-02-02 MED ORDER — INSULIN ASPART 100 UNIT/ML IJ SOLN
10.0000 [IU] | Freq: Once | INTRAMUSCULAR | Status: AC
  Administered 2022-02-02: 10 [IU] via SUBCUTANEOUS

## 2022-02-02 MED ORDER — ONDANSETRON HCL 4 MG/2ML IJ SOLN
4.0000 mg | Freq: Four times a day (QID) | INTRAMUSCULAR | Status: DC | PRN
  Administered 2022-02-12: 4 mg via INTRAVENOUS
  Filled 2022-02-02 (×2): qty 2

## 2022-02-02 MED ORDER — VANCOMYCIN HCL IN DEXTROSE 1-5 GM/200ML-% IV SOLN: 1000.0000 mg | Freq: Once | INTRAVENOUS | Status: DC

## 2022-02-02 MED ORDER — ACETAMINOPHEN 650 MG RE SUPP
650.0000 mg | Freq: Once | RECTAL | Status: AC
  Administered 2022-02-02: 650 mg via RECTAL
  Filled 2022-02-02: qty 1

## 2022-02-02 MED ORDER — ACETAMINOPHEN 500 MG PO TABS: 500.0000 mg | ORAL_TABLET | Freq: Once | ORAL | Status: DC

## 2022-02-02 MED ORDER — FAMOTIDINE 20 MG PO TABS
20.0000 mg | ORAL_TABLET | Freq: Two times a day (BID) | ORAL | Status: DC
  Filled 2022-02-02: qty 1

## 2022-02-02 MED ORDER — PHENYLEPHRINE 40 MCG/ML (10ML) SYRINGE FOR IV PUSH (FOR BLOOD PRESSURE SUPPORT)
PREFILLED_SYRINGE | INTRAVENOUS | Status: AC
  Filled 2022-02-02: qty 10

## 2022-02-02 MED ORDER — LACTATED RINGERS IV SOLN: INTRAVENOUS | Status: DC

## 2022-02-02 MED ORDER — INSULIN GLARGINE-YFGN 100 UNIT/ML ~~LOC~~ SOLN
20.0000 [IU] | Freq: Every day | SUBCUTANEOUS | Status: DC
  Filled 2022-02-02 (×2): qty 0.2

## 2022-02-02 MED ORDER — LIDOCAINE HCL (PF) 1 % IJ SOLN: 5.0000 mL | INTRAMUSCULAR | Status: DC | PRN

## 2022-02-02 MED ORDER — ALBUTEROL SULFATE (2.5 MG/3ML) 0.083% IN NEBU: 2.5000 mg | INHALATION_SOLUTION | RESPIRATORY_TRACT | Status: DC | PRN

## 2022-02-02 MED ORDER — LACTATED RINGERS IV BOLUS (SEPSIS): 1000.0000 mL | Freq: Once | INTRAVENOUS | Status: DC

## 2022-02-02 MED ORDER — METRONIDAZOLE 500 MG PO TABS
500.0000 mg | ORAL_TABLET | Freq: Two times a day (BID) | ORAL | Status: DC
  Administered 2022-02-02 (×2): 500 mg via ORAL
  Filled 2022-02-02 (×2): qty 1

## 2022-02-02 MED ORDER — VANCOMYCIN HCL 2000 MG/400ML IV SOLN
2000.0000 mg | Freq: Once | INTRAVENOUS | Status: AC
  Administered 2022-02-02: 2000 mg via INTRAVENOUS
  Filled 2022-02-02: qty 400

## 2022-02-02 MED ORDER — SODIUM CHLORIDE 0.9 % IV SOLN
2.0000 g | Freq: Once | INTRAVENOUS | Status: AC
  Administered 2022-02-02: 2 g via INTRAVENOUS
  Filled 2022-02-02: qty 2

## 2022-02-02 MED ORDER — POLYETHYLENE GLYCOL 3350 17 G PO PACK
17.0000 g | PACK | Freq: Every day | ORAL | Status: DC | PRN
  Administered 2022-02-06 – 2022-02-09 (×2): 17 g via ORAL
  Filled 2022-02-02 (×2): qty 1

## 2022-02-02 MED ORDER — LACTATED RINGERS IV BOLUS (SEPSIS)
1000.0000 mL | Freq: Once | INTRAVENOUS | Status: AC
  Administered 2022-02-02: 1000 mL via INTRAVENOUS

## 2022-02-02 MED ORDER — FENTANYL CITRATE (PF) 250 MCG/5ML IJ SOLN
INTRAMUSCULAR | Status: AC
  Filled 2022-02-02: qty 5

## 2022-02-02 MED ORDER — PHENYLEPHRINE HCL-NACL 20-0.9 MG/250ML-% IV SOLN
INTRAVENOUS | Status: DC | PRN
  Administered 2022-02-02: 15 ug/min via INTRAVENOUS

## 2022-02-02 MED ORDER — PROPOFOL 500 MG/50ML IV EMUL
INTRAVENOUS | Status: DC | PRN
  Administered 2022-02-02: 25 ug/kg/min via INTRAVENOUS

## 2022-02-02 MED ORDER — OXYCODONE HCL 5 MG PO TABS: 5.0000 mg | ORAL_TABLET | Freq: Once | ORAL | Status: DC | PRN

## 2022-02-02 MED ORDER — OXYCODONE HCL 5 MG PO TABS
5.0000 mg | ORAL_TABLET | ORAL | Status: DC | PRN
  Administered 2022-02-02: 5 mg via ORAL
  Filled 2022-02-02: qty 1

## 2022-02-02 MED ORDER — ONDANSETRON HCL 4 MG PO TABS: 4.0000 mg | ORAL_TABLET | Freq: Four times a day (QID) | ORAL | Status: DC | PRN

## 2022-02-02 MED ORDER — ALTEPLASE 2 MG IJ SOLR: 2.0000 mg | Freq: Once | INTRAMUSCULAR | Status: DC | PRN

## 2022-02-02 MED ORDER — ONDANSETRON HCL 4 MG/2ML IJ SOLN
INTRAMUSCULAR | Status: AC
  Filled 2022-02-02: qty 2

## 2022-02-02 MED ORDER — SUCROFERRIC OXYHYDROXIDE 500 MG PO CHEW
1000.0000 mg | CHEWABLE_TABLET | Freq: Three times a day (TID) | ORAL | Status: DC
  Administered 2022-02-03 – 2022-02-12 (×15): 1000 mg via ORAL
  Administered 2022-02-12: 250 mg via ORAL
  Administered 2022-02-13 – 2022-02-20 (×18): 1000 mg via ORAL
  Filled 2022-02-02 (×57): qty 2

## 2022-02-02 MED ORDER — INSULIN ASPART 100 UNIT/ML IJ SOLN
0.0000 [IU] | Freq: Three times a day (TID) | INTRAMUSCULAR | Status: DC
  Administered 2022-02-02: 4 [IU] via SUBCUTANEOUS
  Administered 2022-02-02: 20 [IU] via SUBCUTANEOUS
  Filled 2022-02-02: qty 1

## 2022-02-02 MED ORDER — LIDOCAINE HCL 1 % IJ SOLN
INTRAMUSCULAR | Status: DC | PRN
  Administered 2022-02-02: 20 mL via INTRAMUSCULAR

## 2022-02-02 MED ORDER — ONDANSETRON HCL 4 MG/2ML IJ SOLN: 4.0000 mg | Freq: Four times a day (QID) | INTRAMUSCULAR | Status: DC | PRN

## 2022-02-02 MED ORDER — ORAL CARE MOUTH RINSE: 15.0000 mL | Freq: Once | OROMUCOSAL | Status: AC

## 2022-02-02 MED ORDER — CHLORHEXIDINE GLUCONATE 0.12 % MT SOLN: 15.0000 mL | Freq: Once | OROMUCOSAL | Status: AC

## 2022-02-02 MED ORDER — OXYCODONE HCL 5 MG/5ML PO SOLN: 5.0000 mg | Freq: Once | ORAL | Status: DC | PRN

## 2022-02-02 MED ORDER — FENTANYL CITRATE (PF) 250 MCG/5ML IJ SOLN
INTRAMUSCULAR | Status: DC | PRN
  Administered 2022-02-02: 25 ug via INTRAVENOUS

## 2022-02-02 MED ORDER — LIDOCAINE HCL (PF) 1 % IJ SOLN
INTRAMUSCULAR | Status: AC
  Filled 2022-02-02: qty 30

## 2022-02-02 MED ORDER — FAMOTIDINE 20 MG PO TABS
20.0000 mg | ORAL_TABLET | Freq: Every day | ORAL | Status: DC
  Administered 2022-02-02 – 2022-02-19 (×17): 20 mg via ORAL
  Filled 2022-02-02 (×18): qty 1

## 2022-02-02 MED ORDER — ACETAMINOPHEN 650 MG RE SUPP
650.0000 mg | Freq: Four times a day (QID) | RECTAL | Status: DC | PRN
  Filled 2022-02-02 (×2): qty 1

## 2022-02-02 MED ORDER — HEPARIN SODIUM (PORCINE) 1000 UNIT/ML DIALYSIS: 1000.0000 [IU] | INTRAMUSCULAR | Status: DC | PRN

## 2022-02-02 MED ORDER — CHLORHEXIDINE GLUCONATE 0.12 % MT SOLN
OROMUCOSAL | Status: AC
  Administered 2022-02-02: 15 mL via OROMUCOSAL
  Filled 2022-02-02: qty 15

## 2022-02-02 MED ORDER — SODIUM CHLORIDE 0.9 % IV BOLUS: 250.0000 mL | Freq: Once | INTRAVENOUS | Status: DC | PRN

## 2022-02-02 MED ORDER — INSULIN ASPART 100 UNIT/ML IJ SOLN
5.0000 [IU] | Freq: Once | INTRAMUSCULAR | Status: AC
  Administered 2022-02-02: 5 [IU] via SUBCUTANEOUS

## 2022-02-02 MED ORDER — CHLORHEXIDINE GLUCONATE CLOTH 2 % EX PADS
6.0000 | MEDICATED_PAD | Freq: Every day | CUTANEOUS | Status: DC
  Administered 2022-02-03 – 2022-02-20 (×15): 6 via TOPICAL

## 2022-02-02 MED ORDER — KETOROLAC TROMETHAMINE 15 MG/ML IJ SOLN
15.0000 mg | Freq: Once | INTRAMUSCULAR | Status: AC
  Administered 2022-02-02: 15 mg via INTRAVENOUS
  Filled 2022-02-02: qty 1

## 2022-02-02 MED ORDER — LIDOCAINE-PRILOCAINE 2.5-2.5 % EX CREA
1.0000 "application " | TOPICAL_CREAM | CUTANEOUS | Status: DC | PRN
  Filled 2022-02-02: qty 5

## 2022-02-02 MED ORDER — SODIUM CHLORIDE 0.9 % IV SOLN
2.0000 g | INTRAVENOUS | Status: DC
  Administered 2022-02-03 – 2022-02-19 (×8): 2 g via INTRAVENOUS
  Filled 2022-02-02 (×9): qty 2

## 2022-02-02 MED ORDER — PENTAFLUOROPROP-TETRAFLUOROETH EX AERO: 1.0000 "application " | INHALATION_SPRAY | CUTANEOUS | Status: DC | PRN

## 2022-02-02 MED ORDER — SODIUM CHLORIDE 0.9 % IV SOLN: 100.0000 mL | INTRAVENOUS | Status: DC | PRN

## 2022-02-02 MED ORDER — INSULIN ASPART 100 UNIT/ML IJ SOLN: 0.0000 [IU] | INTRAMUSCULAR | Status: DC | PRN

## 2022-02-02 MED ORDER — ACETAMINOPHEN 650 MG RE SUPP
650.0000 mg | Freq: Once | RECTAL | Status: AC
  Administered 2022-02-02: 650 mg via RECTAL

## 2022-02-02 MED ORDER — MORPHINE SULFATE (PF) 2 MG/ML IV SOLN: 2.0000 mg | INTRAVENOUS | Status: DC | PRN

## 2022-02-02 MED ORDER — CHLORHEXIDINE GLUCONATE CLOTH 2 % EX PADS: 6.0000 | MEDICATED_PAD | Freq: Once | CUTANEOUS | Status: DC

## 2022-02-02 MED ORDER — ACETAMINOPHEN 325 MG PO TABS
650.0000 mg | ORAL_TABLET | Freq: Four times a day (QID) | ORAL | Status: DC | PRN
  Administered 2022-02-02 – 2022-02-07 (×2): 650 mg via ORAL
  Filled 2022-02-02: qty 2

## 2022-02-02 MED ORDER — BUPIVACAINE HCL (PF) 0.25 % IJ SOLN
INTRAMUSCULAR | Status: AC
  Filled 2022-02-02: qty 30

## 2022-02-02 MED ORDER — PROPOFOL 10 MG/ML IV BOLUS
INTRAVENOUS | Status: DC | PRN
  Administered 2022-02-02 (×2): 10 mg via INTRAVENOUS

## 2022-02-02 MED ORDER — METRONIDAZOLE 500 MG/100ML IV SOLN
500.0000 mg | Freq: Once | INTRAVENOUS | Status: AC
  Administered 2022-02-02: 500 mg via INTRAVENOUS
  Filled 2022-02-02: qty 100

## 2022-02-02 MED ORDER — ACETAMINOPHEN 650 MG RE SUPP
RECTAL | Status: AC
  Filled 2022-02-02: qty 1

## 2022-02-02 MED ORDER — SODIUM CHLORIDE 0.9 % IV SOLN: INTRAVENOUS | Status: DC

## 2022-02-02 MED ORDER — FENTANYL CITRATE (PF) 100 MCG/2ML IJ SOLN: 25.0000 ug | INTRAMUSCULAR | Status: DC | PRN

## 2022-02-02 MED ORDER — PHENYLEPHRINE 40 MCG/ML (10ML) SYRINGE FOR IV PUSH (FOR BLOOD PRESSURE SUPPORT)
PREFILLED_SYRINGE | INTRAVENOUS | Status: DC | PRN
  Administered 2022-02-02: 80 ug via INTRAVENOUS
  Administered 2022-02-02: 40 ug via INTRAVENOUS

## 2022-02-02 MED ORDER — ONDANSETRON HCL 4 MG/2ML IJ SOLN
INTRAMUSCULAR | Status: DC | PRN
  Administered 2022-02-02: 4 mg via INTRAVENOUS

## 2022-02-02 MED ORDER — VANCOMYCIN HCL IN DEXTROSE 1-5 GM/200ML-% IV SOLN
1000.0000 mg | INTRAVENOUS | Status: DC
  Administered 2022-02-03 – 2022-02-12 (×5): 1000 mg via INTRAVENOUS
  Filled 2022-02-02 (×8): qty 200

## 2022-02-02 MED ORDER — HEPARIN SODIUM (PORCINE) 5000 UNIT/ML IJ SOLN
5000.0000 [IU] | Freq: Three times a day (TID) | INTRAMUSCULAR | Status: DC
  Administered 2022-02-02 – 2022-02-20 (×52): 5000 [IU] via SUBCUTANEOUS
  Filled 2022-02-02 (×53): qty 1

## 2022-02-02 MED ORDER — PANTOPRAZOLE SODIUM 40 MG PO TBEC
40.0000 mg | DELAYED_RELEASE_TABLET | Freq: Every day | ORAL | Status: DC
Start: 2022-02-02 — End: 2022-02-03
  Administered 2022-02-02: 40 mg via ORAL
  Filled 2022-02-02: qty 1

## 2022-02-02 SURGICAL SUPPLY — 41 items
BAG COUNTER SPONGE SURGICOUNT (BAG) ×2
BLADE AVERAGE 25X9 (BLADE) ×2
BLADE SAW SGTL NAR THIN XSHT (BLADE) ×2
BLADE SURG 10 STRL SS (BLADE) ×2
BNDG COHESIVE 4X5 TAN STRL (GAUZE/BANDAGES/DRESSINGS) ×2
BNDG ELASTIC 4X5.8 VLCR STR LF (GAUZE/BANDAGES/DRESSINGS) ×2
BNDG ELASTIC 6X10 VLCR STRL LF (GAUZE/BANDAGES/DRESSINGS) ×2
BNDG ESMARK 4X9 LF (GAUZE/BANDAGES/DRESSINGS) ×2
BNDG GAUZE ELAST 4 BULKY (GAUZE/BANDAGES/DRESSINGS) ×2
CNTNR URN SCR LID CUP LEK RST (MISCELLANEOUS) ×2
CONT SPEC 4OZ STRL OR WHT (MISCELLANEOUS) ×4
COVER SURGICAL LIGHT HANDLE (MISCELLANEOUS) ×2
CUFF TOURN SGL QUICK 24 (TOURNIQUET CUFF) ×1
CUFF TRNQT CYL 24X4X16.5-23 (TOURNIQUET CUFF) ×1
DRAPE SURG 17X23 STRL (DRAPES) ×2
DRSG PAD ABDOMINAL 8X10 ST (GAUZE/BANDAGES/DRESSINGS) ×2
ELECT NDL TIP 2.8 STRL (NEEDLE) IMPLANT
ELECT NEEDLE TIP 2.8 STRL (NEEDLE) ×2
ELECT REM PT RETURN 9FT ADLT (ELECTROSURGICAL) ×2
GAUZE SPONGE 4X4 12PLY STRL (GAUZE/BANDAGES/DRESSINGS) ×2
GAUZE SPONGE 4X4 12PLY STRL LF (GAUZE/BANDAGES/DRESSINGS) ×2
GLOVE SURG ENC MOIS LTX SZ6.5 (GLOVE) ×2
GLOVE SURG UNDER LTX SZ6.5 (GLOVE) ×2
GLOVE SURG UNDER POLY LF SZ6.5 (GLOVE) ×2
GOWN STRL REUS W/ TWL LRG LVL3 (GOWN DISPOSABLE) ×2
GOWN STRL REUS W/TWL LRG LVL3 (GOWN DISPOSABLE) ×4
HANDPIECE INTERPULSE COAX TIP (DISPOSABLE) ×2
KIT BASIN OR (CUSTOM PROCEDURE TRAY) ×2
KIT TURNOVER KIT B (KITS) ×2
NDL HYPO 25GX1X1/2 BEV (NEEDLE) ×1 IMPLANT
NEEDLE HYPO 25GX1X1/2 BEV (NEEDLE) ×4
NS IRRIG 1000ML POUR BTL (IV SOLUTION) ×2
PACK ORTHO EXTREMITY (CUSTOM PROCEDURE TRAY) ×2
PAD ARMBOARD 7.5X6 YLW CONV (MISCELLANEOUS) ×2
SET HNDPC FAN SPRY TIP SCT (DISPOSABLE) ×1
STRIP CLOSURE SKIN 1/2X4 (GAUZE/BANDAGES/DRESSINGS) ×2
SYR CONTROL 10ML LL (SYRINGE) ×4
TOWEL GREEN STERILE (TOWEL DISPOSABLE) ×2
TOWEL GREEN STERILE FF (TOWEL DISPOSABLE) ×2
TUBE CONNECTING 12X1/4 (SUCTIONS) ×2
YANKAUER SUCT BULB TIP NO VENT (SUCTIONS) ×2

## 2022-02-02 NOTE — Assessment & Plan Note (Addendum)
-  Per renal ?-Being evaluated outpatient for possible transplant ?0S/p L arm fistulogram with vascular 3/23 -> L central angioplasty innominate and subclavian vein, L peripheral angioplasty cephalic vein ?-Patient with hemodialysis Monday Wednesday Friday schedule currently but will be changed to TTS Schedule   ?-Per nephrology. ?

## 2022-02-02 NOTE — Assessment & Plan Note (Deleted)
X-ray right foot shows extensive subcu gas with soft tissue swelling without superimposed osseous erosion ?MRI right foot upon arrival to [facility] ?Treat with vancomycin, cefepime, Flagyl ?Discussed with Dr. Blenda Mounts, podiatry who will see in consult ?Continue to monitor ?

## 2022-02-02 NOTE — ED Triage Notes (Signed)
Pt brought in by [facility] from home. Pts family called after they were unable to get in touch with him. Pt found laying on couch minimally responsive. CBG 522 for EMS.  ?

## 2022-02-02 NOTE — Assessment & Plan Note (Addendum)
-  Related to stroke as well as infection and acute hospitalization ?-Clinical improvement. 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 ? ?

## 2022-02-02 NOTE — Anesthesia Procedure Notes (Signed)
Procedure Name: [place] ?Date/Time: 02/02/2022 12:12 PM ?Performed by: Wilburn Cornelia, CRNA ?Pre-anesthesia Checklist: Patient identified, Emergency Drugs available, Suction available, Patient being monitored and Timeout performed ?Patient Re-evaluated:Patient Re-evaluated prior to induction ?Oxygen Delivery Method: Simple face mask ?Placement Confirmation: breath sounds checked- equal and bilateral and positive ETCO2 ?Dental Injury: Teeth and Oropharynx as per pre-operative assessment  ? ? ? ? ?

## 2022-02-02 NOTE — Progress Notes (Addendum)
?Progress Note ? ? ?Patient: [name] RODKEY Sr. NUU:[id number] DOB: September 10, 1961 DOA: 02/02/2022     0 ?DOS: the patient was seen and examined on 02/02/2022 ?  ?Brief hospital course: ?61 y/o male history of DM, ESRD on HD, admitted to the hospital with fever, acute encephalopathy, sepsis and right foot infection.  He has a history of prior right fifth metatarsal amputation performed many years ago in [place].  Plain films of his foot indicate swelling and subcutaneous gas without any obvious bony erosion.  He will need further evaluation with MRI.  He has been started on IV antibiotics.  Podiatry consulted for potential surgical management.  Nephrology urology consult for dialysis needs. ? ?Assessment and Plan: ?* Osteomyelitis ([place]) ?X-ray right foot shows extensive subcu gas with soft tissue swelling without superimposed osseous erosion ?MRI right foot upon arrival to [facility] ?Treat with vancomycin, cefepime, Flagyl ?Discussed with Dr. Blenda Mounts, podiatry who will see in consult ?Continue to monitor ? ?GERD (gastroesophageal reflux disease) ?Continue Protonix ? ?Protein-calorie malnutrition, mild ([place]) ?Patient currently n.p.o. in case of surgery ?When patient is able to tolerate p.o., encourage nutrient dense food choices ? ?Acute respiratory failure with hypoxia ([place]) ?Secondary to sepsis ?Currently on 3L nasal cannula, with sats 100% ?Breathing treatments as needed ?Continue to wean down oxygen ? ?Sepsis ([place]) ?Sepsis secondary to osteomyelitis ?SIRS criteria temperature 104.3, heart rate 103, respiratory rate 26, leukocytosis 12 ?Life threatening organ dysfunction 2/2 infection is evidenced by: Acute respiratory failure with hypoxia requiring 4 L nasal cannula, lactic acidosis 3.1 ?Antibiotics started Vanco cefepime and Flagyl, continue these per the lower extremity wound order set ?Fluids given prior to admission 2 L bolus ?Sepsis order set utilized ?Blood cultures pending ? ? ?Acute metabolic  encephalopathy ?Secondary to hypoxia, fever/sepsis ?CT head shows no acute findings ?See above for treatment of sepsis ?Continue to monitor ? ?Chronic systolic CHF (congestive heart failure) ([place]) ?EF 45% per echo 05/2021 ?Cardiomegaly and central vascular congestion on chest x-ray ?He received 2L bolus in ED for sepsis ?Currently hemodynamics stable ?Will hold off on further IV fluids ?Monitor respiratory status ? ?HTN (hypertension) ?Blood pressure initially low at 96/51 ?Blood pressure normalized with 2 L bolus ?Continue to monitor ?Patient does not have antihypertensives on home med list ? ?DM2 (diabetes mellitus, type 2) ([place]) ?Patient takes [place] and NovoLog at home ?Continue reduced dose of basal insulin, with sliding scale coverage ?Glucose is uncontrolled in the ER 479, but has since started to trend down ?Sliding scale coverage will be every 6 hours while patient is n.p.o. ?Last available A1c 8.6 from 12/2020, repeat pending ? ?ESRD (end stage renal disease) ([place]) ?Consult nephrology ?HD schedule MWF at [facility] ?Last dialysis was Friday, had full treatment according to family ?Vascular congestion on chest x-ray with 3 L nasal cannula oxygen requirement ?BUN 42, creatinine 11.09, potassium 3.5 ?Lactic acid initially elevated 3.1, repeat pending ?Continue Velphoro ?Continue to monitor ?Being followed at [facility] for potential renal transplant ? ? ? ? ?  ? ?Subjective: patient is sleeping my arrival. Wakes up to voice. He is confused, cannot answer questions ? ?Physical Exam: ?Vitals:  ? 02/02/22 0909 02/02/22 0912 02/02/22 0913 02/02/22 1012  ?BP:   124/69 94/69  ?Pulse: 93 93 92 97  ?Resp: (!) 24 (!) 22 (!) 24 18  ?Temp:   99.2 ?F (37.3 ?C) 100.1 ?F (37.8 ?C)  ?TempSrc:   Oral Oral  ?SpO2: 100% 100% 100% 100%  ?Weight:      ?Height:      ? ?  General exam: somnolent, no distress ?Respiratory system: Clear to auscultation. Respiratory effort normal. ?Cardiovascular system:RRR. No murmurs, rubs,  gallops. ?Gastrointestinal system: Abdomen is nondistended, soft and nontender. No organomegaly or masses felt. Normal bowel sounds heard. ?Central nervous system: No focal neurological deficits. ?Extremities: No C/C/E, +pedal pulses ?Skin: swelling/erythema noted over right foot amputation site ?Psychiatry: confused ? ?Data Reviewed: ? ?Reviewed xray of foot/chest, chemistry and cbc ? ?Family Communication: updated patient's daughter over the phone ? ?Disposition: ?Status is: Inpatient ?Remains inpatient appropriate because: treatment of sepsis/foot infection ? Planned Discharge Destination:  to be determined ? ? ? ?Time spent: 35 minutes ? ?Author: ?Kathie Dike, MD ?02/02/2022 10:23 AM ? ?For on call review [email or web address].  ?

## 2022-02-02 NOTE — H&P (Signed)
History and Physical    Patient: [name] [name]. RUE:[id number] DOB: December 24, 1960 DOA: 02/02/2022 DOS: the patient was seen and examined on 02/02/2022 PCP: [facility]  Patient coming from: Home  Chief Complaint:  Chief Complaint  Patient presents with   Altered Mental Status   HPI: [name] [name] [name]. is a 61 y.o. male with medical history significant of diabetes mellitus type 2, CHF, ESRD, hypertension, myocardial infarction, nonischemic cardiomyopathy presents the ED today with chief complaint of altered mental status.  Patient reports that he is not sure why he is in the ED today.  Chart review reveals that patient was acting normal on the 17th.  Family saw him on the 18th and said that he was confused.  Patient has no recollection of this.  Patient does say that he is not in any pain, does not know why he is here.  He reports he does not wear oxygen at home.  Patient has no other complaints at this time.  In the ED patient met sepsis criteria with fever, tachycardia, tachypnea, leukocytosis, respiratory failure, and lactic acidosis.  Patient has a nonhealing diabetic foot ulcer overlying recent amputation on the right lower extremity lateral aspect.  This was x-rayed and shows gas with signs of osteomyelitis.  Patient was started on vancomycin, cefepime, Flagyl.  Patient is being admitted to [facility] for evaluation with foot and ankle.  Of note patient's son would like to be called with updates.  His name is [name], [name]. and his phone number is [phone]. Review of Systems: As mentioned in the history of present illness. All other systems reviewed and are negative. Past Medical History:  Diagnosis Date   CHF (congestive heart failure) (HCC)    Diabetes mellitus without complication (HCC)    type 2   ESRD on hemodialysis (HCC)    mon wed fri   Hypertension    Kidney failure    Myocardial infarction (HCC)    Nonischemic cardiomyopathy [facility])    Past  Surgical History:  Procedure Laterality Date   A/V FISTULAGRAM N/A 08/15/2019   Procedure: A/V FISTULAGRAM;  Surgeon: Maeola Harman, MD;  Location: [facility] INVASIVE CV LAB;  Service: Cardiovascular;  Laterality: N/A;   AV FISTULA PLACEMENT     CARDIAC CATHETERIZATION  02/17/2018   LHC 02/17/18 ([name]): 30% pLAD, 30% dRCA, LVEDP 25-30, LVEF 25%.     CATARACT EXTRACTION W/PHACO Right 03/29/2020   Procedure: CATARACT EXTRACTION PHACO AND INTRAOCULAR LENS PLACEMENT (IOC)  RIGHT DIABETIC;  Surgeon: Lockie Mola, MD;  Location: [facility] ORS;  Service: Ophthalmology;  Laterality: Right;  Lot # [id number] H [place]: 02:30.9 AP% 9.5% CDE: 14.38   EYE SURGERY Right    cataract removed   INSERTION OF DIALYSIS CATHETER  08/13/2019   Procedure: Insertion Of Dialysis Catheter;  Surgeon: Larina Earthly, MD;  Location: [facility] OR;  Service: Vascular;;   INSERTION OF DIALYSIS CATHETER Left 07/19/2020   Procedure: INSERTION OF DIALYSIS CATHETER;  Surgeon: Maeola Harman, MD;  Location: [facility] OR;  Service: Vascular;  Laterality: Left;   IRRIGATION AND DEBRIDEMENT ABSCESS     PERIPHERAL VASCULAR BALLOON ANGIOPLASTY  08/15/2019   Procedure: PERIPHERAL VASCULAR BALLOON ANGIOPLASTY;  Surgeon: Maeola Harman, MD;  Location: [facility] INVASIVE CV LAB;  Service: Cardiovascular;;   REVISON OF ARTERIOVENOUS FISTULA Left 07/19/2020   Procedure: REVISON/PLICATION OF ARTERIOVENOUS FISTULA LEFT;  Surgeon: Maeola Harman, MD;  Location: [facility] OR;  Service: Vascular;  Laterality: Left;  THROMBECTOMY AND REVISION OF ARTERIOVENTOUS (AV) GORETEX  GRAFT Left 08/13/2019   Procedure: THROMBECTOMY OF LEFT ARM ARTERIOVENTOUS (AV) FISTULA;  Surgeon: Larina Earthly, MD;  Location: [facility] OR;  Service: Vascular;  Laterality: Left;   TOE AMPUTATION     Social History:  reports that he has never smoked. He has never used smokeless tobacco. He reports that he does not currently use alcohol. He reports that he does not use  drugs.  Allergies  Allergen Reactions   Sulfa Antibiotics Nausea And Vomiting    Family History  Problem Relation Age of Onset   Hypertension Mother     Prior to Admission medications   Medication Sig Start Date End Date Taking? Authorizing Provider  aspirin 81 MG chewable tablet Chew 81 mg by mouth daily. 09/01/18   [provider]  BD PEN NEEDLE NANO 2ND GEN 32G X 4 MM MISC USE AS DIRECTED 3 TIMES A DAY 07/12/20   [provider]  bisacodyl (DULCOLAX) 5 MG EC tablet Take 1 tablet (5 mg total) by mouth at bedtime as needed for mild constipation or moderate constipation. 08/16/19   Esperanza Sheets, MD  famotidine (PEPCID) 20 MG tablet Take 1 tablet (20 mg total) by mouth at bedtime. Patient taking differently: Take 20 mg by mouth 2 (two) times daily. 08/16/19   Esperanza Sheets, MD  HUMULIN 70/30 (70-30) 100 UNIT/ML injection Inject 20 Units into the skin 2 (two) times daily with a meal.  08/31/18   [provider]  lidocaine-prilocaine (EMLA) cream Apply 1 application topically every Monday, Wednesday, and Friday with hemodialysis.  08/17/18   [provider]  NOVOLOG FLEXPEN 100 UNIT/ML FlexPen SMARTSIG:0-30 Unit(s) SUB-Q 3 Times Daily 07/13/20   [provider]  oxyCODONE-acetaminophen (PERCOCET/ROXICET) 5-325 MG tablet Take 1 tablet by mouth every 6 (six) hours as needed. Patient not taking: Reported on 01/29/2022 07/19/20   Lars Mage, PA-C  sildenafil (VIAGRA) 100 MG tablet Take 100 mg by mouth daily as needed for erectile dysfunction.  Patient not taking: Reported on 01/29/2022 09/02/18   [provider]  sucroferric oxyhydroxide (VELPHORO) 500 MG chewable tablet Chew 1,000 mg by mouth 3 (three) times daily with meals.    [provider]  TRESIBA FLEXTOUCH 100 UNIT/ML FlexTouch Pen SMARTSIG:0-30 Unit(s) SUB-Q Daily 07/16/20   [provider]    Physical Exam: Vitals:   02/02/22 0350 02/02/22 0400 02/02/22  0420 02/02/22 0430  BP: (!) 107/59 (!) 114/59 (!) 95/53 102/62  Pulse: 87 87 86 86  Resp: 16 (!) 22 15 (!) 24  Temp:    (!) 101.9 F (38.8 C)  TempSrc:    Rectal  SpO2: 99% 96% 98% 100%  Weight:      Height:       1.  General: Patient lying supine in bed,  no acute distress   2. Psychiatric: Somnolent and oriented x person, place, time, not context, mood and behavior normal for situation, pleasant and cooperative with exam   3. Neurologic: Speech and language are normal, face is symmetric, moves all 4 extremities voluntarily, at baseline without acute deficits on limited exam, numbness in bilateral feet is also baseline, no sensation in the tips of the toes   4. HEENMT:  Head is atraumatic, normocephalic, pupils reactive to light, neck is supple, trachea is midline, mucous membranes are moist   5. Respiratory : Lungs are clear to auscultation bilaterally without wheezing, rhonchi, rales, no cyanosis, no increase in work of  breathing or accessory muscle use   6. Cardiovascular : Heart rate normal, rhythm is regular, no murmurs, rubs or gallops,  peripheral pulses palpated   7. Gastrointestinal:  Abdomen is soft, nondistended, nontender to palpation bowel sounds active, no masses or organomegaly palpated   8. Skin:  Skin is warm, dry and intact without rashes, acute lesions, or ulcers on limited exam   9.Musculoskeletal:  Nonhealing diabetic foot ulcer with central necrosis, and edema, and evidence of prior drainage on the lateral aspect of the right plantar surface of the foot  Data Reviewed: In the ED Patient is febrile 104.3, tachycardic up to 103, respiratory rate is up to 26, blood pressure low as 96/51, satting at 95% on 4 L Leukocytosis of 12, hemoglobin 12 Chemistry panel reveals a pseudohyponatremia that corrects for glucose of 479 BUN is elevated at 42, creatinine elevated at 11.09-patient is ESRD patient on hemodialysis Blood culture pending CT head without acute  findings Chest x-ray shows mild cardiomegaly vascular congestion X-ray right foot shows extensive subcu gas and soft tissue swelling with superimposed osseous erosion Blood culture pending EKG shows sinus tach, heart rate 103, QTc 502 Urine culture pending  Assessment and Plan: * Osteomyelitis (HCC) X-ray right foot shows extensive subcu gas with soft tissue swelling and superimposed osseous erosion MRI right foot upon arrival to [facility] Treat with vancomycin, cefepime, Flagyl Transfer to [facility] for evaluation by foot and ankle/Ortho Continue to monitor  GERD (gastroesophageal reflux disease) Continue Protonix  Protein-calorie malnutrition, mild (HCC) Patient currently n.p.o. in case of surgery When patient is able to tolerate p.o., encourage nutrient dense food choices  Acute respiratory failure with hypoxia (HCC) Secondary to sepsis Continue 4 L nasal cannula Breathing treatments as needed Wean off O2 as tolerated Continue to monitor  Sepsis (HCC) Sepsis secondary to osteomyelitis SIRS criteria temperature 104.3, heart rate 103, respiratory rate 26, leukocytosis 12 Life threatening organ dysfunction 2/2 infection is evidenced by: Acute respiratory failure with hypoxia requiring 4 L nasal cannula, lactic acidosis 3.1 Antibiotics started Vanco cefepime and Flagyl, continue these per the lower extremity wound order set Fluids given prior to admission 2 L bolus Sepsis order set utilized Blood cultures pending   Acute metabolic encephalopathy Secondary to hypoxia, sepsis, elevated BUN CT head shows no acute findings See above for treatment of sepsis Continue to monitor  Chronic systolic CHF (congestive heart failure) (HCC) Cardiomegaly and vascular congestion on chest x-ray Monitor carefully for fluid balance Holding aspirin for possible surgery  HTN (hypertension) Blood pressure initially low at 96/51 Blood pressure normalized with 2 L bolus Continue to  monitor Patient does not have antihypertensives on home med list  DM2 (diabetes mellitus, type 2) (HCC) Patient takes [place] and NovoLog at home Continue reduced dose of basal insulin, with sliding scale coverage Glucose is uncontrolled in the ER 479 Sliding scale coverage will be every 6 hours while patient is n.p.o.  ESRD (end stage renal disease) [facility]) Consult nephrology Last dialysis was Friday Vascular congestion on chest x-ray with 4 L nasal cannula oxygen requirement BUN 42, creatinine 11.09, potassium 3.5 Lactic acid initially elevated 3.1, repeat pending Continue Velphoro Continue to monitor      Advance Care Planning:   Code Status: Prior full  Consults: Orthopedic, nephrology  Family Communication: Son was updated the patient is being admitted to [facility]  Severity of Illness: The appropriate patient status for this patient is INPATIENT. Inpatient status is judged to be reasonable and necessary in order to  provide the required intensity of service to ensure the patient's safety. The patient's presenting symptoms, physical exam findings, and initial radiographic and laboratory data in the context of their chronic comorbidities is felt to place them at high risk for further clinical deterioration. Furthermore, it is not anticipated that the patient will be medically stable for discharge from the hospital within 2 midnights of admission.   * I certify that at the point of admission it is my clinical judgment that the patient will require inpatient hospital care spanning beyond 2 midnights from the point of admission due to high intensity of service, high risk for further deterioration and high frequency of surveillance required.*  Author: Lilyan Gilford, DO 02/02/2022 5:20 AM  For on call review [email or web address].

## 2022-02-02 NOTE — ED Notes (Signed)
Dr. Zierle-Ghosh at bedside  

## 2022-02-02 NOTE — ED Provider Notes (Signed)
?[facility] ?Provider Note ? ? ?CSN: [id number] ?Arrival date & time: 02/02/22  0100 ? ?  ? ?History ? ?Chief Complaint  ?Patient presents with  ? Altered Mental Status  ? ?Level 5 caveat due to altered mental status ?[name] Cruel Sr. is a 61 y.o. male. ? ?The history is provided by the EMS personnel.  ?Altered Mental Status ?Presenting symptoms: confusion and disorientation   ?Severity:  Severe ?Timing:  Constant ?Progression:  Worsening ?Chronicity:  New ?Associated symptoms: fever   ?Patient with extensive history including end-stage renal disease, diabetes, hypertension presents with altered mental status.  Patient arrives with EMS who gives most of the history ?It reported the patient was seen at his baseline and last known well was early morning of March 18 by his family.  Later on the day he was found lying on his side and confused. ?No other details are known on arrival ?  ? ?Home Medications ?Prior to Admission medications   ?Medication Sig Start Date End Date Taking? Authorizing Provider  ?aspirin 81 MG chewable tablet Chew 81 mg by mouth daily. 09/01/18   [provider]  ?BD PEN NEEDLE NANO 2ND GEN 32G X 4 MM MISC USE AS DIRECTED 3 TIMES A DAY 07/12/20   [provider]  ?bisacodyl (DULCOLAX) 5 MG EC tablet Take 1 tablet (5 mg total) by mouth at bedtime as needed for mild constipation or moderate constipation. 08/16/19   Kinnie Feil, MD  ?famotidine (PEPCID) 20 MG tablet Take 1 tablet (20 mg total) by mouth at bedtime. ?Patient taking differently: Take 20 mg by mouth 2 (two) times daily. 08/16/19   Kinnie Feil, MD  ?HUMULIN 70/30 (70-30) 100 UNIT/ML injection Inject 20 Units into the skin 2 (two) times daily with a meal.  08/31/18   [provider]  ?lidocaine-prilocaine (EMLA) cream Apply 1 application topically every Monday, Wednesday, and Friday with hemodialysis.  08/17/18   [provider]  ?Cira Servant FLEXPEN 100 UNIT/ML FlexPen  SMARTSIG:0-30 Unit(s) SUB-Q 3 Times Daily 07/13/20   [provider]  ?oxyCODONE-acetaminophen (PERCOCET/ROXICET) 5-325 MG tablet Take 1 tablet by mouth every 6 (six) hours as needed. ?Patient not taking: Reported on 01/29/2022 07/19/20   Ulyses Amor, PA-C  ?sildenafil (VIAGRA) 100 MG tablet Take 100 mg by mouth daily as needed for erectile dysfunction.  ?Patient not taking: Reported on 01/29/2022 09/02/18   [provider]  ?sucroferric oxyhydroxide (VELPHORO) 500 MG chewable tablet Chew 1,000 mg by mouth 3 (three) times daily with meals.    [provider]  ?TRESIBA FLEXTOUCH 100 UNIT/ML FlexTouch Pen SMARTSIG:0-30 Unit(s) SUB-Q Daily 07/16/20   [provider]  ?   ? ?Allergies    ?Sulfa antibiotics   ? ?Review of Systems   ?Review of Systems  ?Unable to perform ROS: Mental status change  ?Constitutional:  Positive for fever.  ?Psychiatric/Behavioral:  Positive for confusion.   ? ?Physical Exam ?Updated Vital Signs ?BP 119/65   Pulse 96   Temp (!) 104.3 ?F (40.2 ?C)   Resp (!) 23   Ht 1.854 m (6\' 1" )   Wt 121.1 kg   SpO2 (!) 89%   BMI 35.23 kg/m?  ?Physical Exam ?CONSTITUTIONAL: Altered, ill-appearing ?HEAD: Normocephalic/atraumatic ?EYES: Pupils pinpoint ?ENMT: Food in his mouth ?NECK: supple no meningeal signs ?SPINE/BACK:entire spine nontender ?CV: S1/S2 noted, tachycardic ?LUNGS: Lungs are clear to auscultation bilaterally, no apparent distress ?ABDOMEN: soft, nontender, obese ?NEURO: Pt is somnolent but intermittently arousable to  voice and pain.  He will follow some commands and go back to sleep.  He moves all extremities x4 ?EXTREMITIES: pulses normal/equal, full ROM, crepitus to right foot ?See photo below. ?SKIN: warm, see photo ?PSYCH: Unable to assess ? ? ? ? ?ED Results / Procedures / Treatments   ?Labs ?(all labs ordered are listed, but only abnormal results are displayed) ?Labs Reviewed  ?LACTIC ACID, PLASMA - Abnormal; Notable for the following components:   ?    Result Value  ? Lactic Acid, Venous 3.1 (*)   ? All other components within normal limits  ?LACTIC ACID, PLASMA - Abnormal; Notable for the following components:  ? Lactic Acid, Venous 2.2 (*)   ? All other components within normal limits  ?COMPREHENSIVE METABOLIC PANEL - Abnormal; Notable for the following components:  ? Sodium 129 (*)   ? Chloride 87 (*)   ? Glucose, Bld 479 (*)   ? BUN 42 (*)   ? Creatinine, Ser 11.09 (*)   ? Albumin 3.0 (*)   ? GFR, Estimated 5 (*)   ? All other components within normal limits  ?CBC WITH DIFFERENTIAL/PLATELET - Abnormal; Notable for the following components:  ? WBC 12.0 (*)   ? RBC 3.87 (*)   ? Hemoglobin 12.0 (*)   ? HCT 37.1 (*)   ? RDW 15.9 (*)   ? Neutro Abs 9.1 (*)   ? Monocytes Absolute 1.9 (*)   ? All other components within normal limits  ?PROTIME-INR - Abnormal; Notable for the following components:  ? Prothrombin Time 15.8 (*)   ? INR 1.3 (*)   ? All other components within normal limits  ?CBG MONITORING, ED - Abnormal; Notable for the following components:  ? Glucose-Capillary 468 (*)   ? All other components within normal limits  ?RESP PANEL BY RT-PCR (FLU A&B, COVID) [id number]  ?CULTURE, BLOOD (ROUTINE X 2)  ?CULTURE, BLOOD (ROUTINE X 2)  ?URINE CULTURE  ?APTT  ?ETHANOL  ?URINALYSIS, ROUTINE W REFLEX MICROSCOPIC  ?RAPID URINE DRUG SCREEN, HOSP PERFORMED  ?LACTIC ACID, PLASMA  ?CBC  ?COMPREHENSIVE METABOLIC PANEL  ?MAGNESIUM  ?HEMOGLOBIN A1C  ?SEDIMENTATION RATE  ?C-REACTIVE PROTEIN  ?PREALBUMIN  ?HEMOGLOBIN A1C  ? ? ?EKG ?EKG Interpretation ? ?Date/Time:  Sunday February 02 2022 01:05:17 EDT ?Ventricular Rate:  103 ?PR Interval:  149 ?QRS Duration: 144 ?QT Interval:  383 ?QTC Calculation: 502 ?R Axis:   -25 ?Text Interpretation: Sinus tachycardia Left bundle branch block Baseline wander in lead(s) V3 V4 V5 Confirmed by Ripley Fraise [phone]) on 02/02/2022 1:08:25 AM ? ?Radiology ?CT Head Wo Contrast ? ?Result Date: 02/02/2022 ?CLINICAL DATA:  Delirium, altered  mental status EXAM: CT HEAD WITHOUT CONTRAST TECHNIQUE: Contiguous axial images were obtained from the base of the skull through the vertex without intravenous contrast. RADIATION DOSE REDUCTION: This exam was performed according to the departmental dose-optimization program which includes automated exposure control, adjustment of the mA and/or kV according to patient size and/or use of iterative reconstruction technique. COMPARISON:  08/05/2019 FINDINGS: Brain: Normal anatomic configuration. Parenchymal volume loss is commensurate with the patient's age. Mild periventricular white matter changes are present likely reflecting the sequela of small vessel ischemia. Multiple remote lacunar infarcts are again identified within the left caudate nucleus, lentiform nucleus, and thalami bilaterally. No abnormal intra or extra-axial mass lesion or fluid collection. No abnormal mass effect or midline shift. No evidence of acute intracranial hemorrhage or infarct. Ventricular size is normal. Cerebellum unremarkable. Vascular: No asymmetric hyperdense vasculature at  the skull base. Skull: Intact Sinuses/Orbits: Small mucous retention cyst noted within the left maxillary sinus. Paranasal sinuses are otherwise clear. Orbits are unremarkable. Other: Mastoid air cells and middle ear cavities are clear. IMPRESSION: No acute intracranial abnormality. Stable senescent change and remote lacunar infarcts. Electronically Signed   By: Fidela Salisbury M.D.   On: 02/02/2022 02:36  ? ?DG Chest Port 1 View ? ?Result Date: 02/02/2022 ?CLINICAL DATA:  Sepsis EXAM: PORTABLE CHEST 1 VIEW COMPARISON:  07/19/2020 FINDINGS: Lung volumes are small but are symmetric. Retrocardiac opacification likely relates to overlying soft tissue in combination with underpenetration. No definite focal pulmonary infiltrate. No pneumothorax or pleural effusion. Mild cardiomegaly. Central pulmonary vasculature is engorged without overt pulmonary edema. IMPRESSION: Mild  cardiomegaly with central pulmonary vascular engorgement. Electronically Signed   By: Fidela Salisbury M.D.   On: 02/02/2022 01:52  ? ?DG Foot Complete Right ? ?Result Date: 02/02/2022 ?CLINICAL DATA:  Sepsis, right foot wou

## 2022-02-02 NOTE — ED Notes (Addendum)
Asking for clarification from DR [facility] regarding fluid bolus amount due to pt being on dialysis- a/w response- ?

## 2022-02-02 NOTE — Progress Notes (Addendum)
Pharmacy Antibiotic Note ? ?[name] QUILLIN Sr. is a 61 y.o. male admitted on 02/02/2022 with  diabetic foot infection with a high risk for MRSA .  Pharmacy has been consulted for cefepime and vancomycin dosing. ? ?WBC 12.0 ? ?Plan: ?Vancomycin 2000 mg IV x 1. ?Vancomycin 1000 mg IV after each hemodialysis. ?Target pre-HD vanc level 15-25 mcg/mL ? ?Cefepime 2 g IV after each HD ? ?Height: 6\' 1"  (185.4 cm) ?Weight: 121.1 kg (267 lb) ?IBW/kg (Calculated) : 79.9 ? ?Temp (24hrs), Avg:104.3 ?F (40.2 ?C), Min:104.3 ?F (40.2 ?C), Max:104.3 ?F (40.2 ?C) ? ?Recent Labs  ?Lab 02/02/22 ?0107  ?WBC 12.0*  ?CREATININE 11.09*  ?LATICACIDVEN 3.1*  ?  ?Estimated Creatinine Clearance: 9.8 mL/min (A) (by C-G formula based on SCr of 11.09 mg/dL (H)).   ? ?Allergies  ?Allergen Reactions  ? Sulfa Antibiotics Nausea And Vomiting  ? ? ?Antimicrobials this admission: ?Vancomycin 3/19 >>  ?Cefepime 3/19 >>  ?Metronidazole 3/19 >> ? ?Thank you for allowing pharmacy to be a part of this patient?s care. ? ?Carma Lair, PharmD Candidate [phone] ?02/02/2022 4:07 AM ? ?

## 2022-02-02 NOTE — ED Notes (Signed)
[facility] leaving with patient at this time.  

## 2022-02-02 NOTE — ED Notes (Addendum)
Pt daughter, Valinda Party called and update on pt status and plan of care, reports she will call and update Tsugio, Elison (pt son) ?

## 2022-02-02 NOTE — Sepsis Progress Note (Signed)
[facility] following code sepsis °

## 2022-02-02 NOTE — ED Notes (Signed)
[facility] called for transportation at this time. NO ETA GIVEN ?

## 2022-02-02 NOTE — Assessment & Plan Note (Addendum)
-  PPI, Pepcid. ?-GI cocktail as needed. ?

## 2022-02-02 NOTE — ED Notes (Signed)
Patient transported to CT 

## 2022-02-02 NOTE — ED Notes (Signed)
Spoke with Phil at [facility] unknown ETA at this time for transport. ?

## 2022-02-02 NOTE — Consult Note (Addendum)
?Subjective:  ?Patient ID: [name] [name]., male    DOB: [birth year],  MRN: [id number] ? ?Patient with past medical history of below seen at beside today for concern of right foot infection with gas. He presented this morning to the ED for altered mental status and fount to have previous fifth ray amputation with gas noted on X-ray. Patient is  confused and sleep. Difficult to arouse. Limited history was obtained. Attempted to contact family but no answer via phone.  ? ?Past Medical History:  ?Diagnosis Date  ? CHF (congestive heart failure) ([place])   ? Diabetes mellitus without complication ([place])   ? type 2  ? ESRD on hemodialysis ([place])   ? mon wed fri  ? Hypertension   ? Kidney failure   ? Myocardial infarction [facility])   ? Nonischemic cardiomyopathy ([place])   ?  ? ?Past Surgical History:  ?Procedure Laterality Date  ? A/V FISTULAGRAM N/A 08/15/2019  ? Procedure: A/V FISTULAGRAM;  Surgeon: [name], MD;  Location: [facility];  Service: Cardiovascular;  Laterality: N/A;  ? AV FISTULA PLACEMENT    ? CARDIAC CATHETERIZATION  02/17/2018  ? LHC 02/17/18 ([name]): 30% pLAD, 30% dRCA, LVEDP 25-30, LVEF 25%.    ? CATARACT EXTRACTION W/PHACO Right 03/29/2020  ? Procedure: CATARACT EXTRACTION PHACO AND INTRAOCULAR LENS PLACEMENT ([place])  RIGHT DIABETIC;  Surgeon: [name], MD;  Location: [facility] ORS;  Service: Ophthalmology;  Laterality: Right;  Lot # [id number] H ?[place]: 02:30.9 ?AP% 9.5% ?CDE: 14.38  ? EYE SURGERY Right   ? cataract removed  ? INSERTION OF DIALYSIS CATHETER  08/13/2019  ? Procedure: Insertion Of Dialysis Catheter;  Surgeon: [name], MD;  Location: [facility];  Service: Vascular;;  ? INSERTION OF DIALYSIS CATHETER Left 07/19/2020  ? Procedure: INSERTION OF DIALYSIS CATHETER;  Surgeon: [name], MD;  Location: [facility];  Service: Vascular;  Laterality: Left;  ? IRRIGATION AND DEBRIDEMENT ABSCESS    ? PERIPHERAL VASCULAR BALLOON ANGIOPLASTY  08/15/2019  ? Procedure:  PERIPHERAL VASCULAR BALLOON ANGIOPLASTY;  Surgeon: [name], MD;  Location: [facility];  Service: Cardiovascular;;  ? REVISON OF ARTERIOVENOUS FISTULA Left 07/19/2020  ? Procedure: REVISON/PLICATION OF ARTERIOVENOUS FISTULA LEFT;  Surgeon: [name], MD;  Location: [facility];  Service: Vascular;  Laterality: Left;  ? THROMBECTOMY AND REVISION OF ARTERIOVENTOUS (AV) GORETEX  GRAFT Left 08/13/2019  ? Procedure: THROMBECTOMY OF LEFT ARM ARTERIOVENTOUS (AV) FISTULA;  Surgeon: [name], MD;  Location: [facility] OR;  Service: Vascular;  Laterality: Left;  ? TOE AMPUTATION    ? ? ?CBC Latest Ref Rng & Units 02/02/2022 12/06/2021 07/19/2020  ?WBC 4.0 - 10.5 K/uL 12.0(H) 6.7 -  ?Hemoglobin 13.0 - 17.0 g/dL 12.0(L) 12.0(L) 14.6  ?Hematocrit 39.0 - 52.0 % 37.1(L) 36.6(L) 43.0  ?Platelets 150 - 400 K/uL 171 185 -  ? ? ?BMP Latest Ref Rng & Units 02/02/2022 02/02/2022 12/06/2021  ?Glucose 70 - 99 mg/dL 376(H) 479(H) 618(HH)  ?BUN 6 - 20 mg/dL 43(H) 42(H) 51(H)  ?Creatinine 0.61 - 1.24 mg/dL 11.09(H) 11.09(H) 10.73(H)  ?Sodium 135 - 145 mmol/L 131(L) 129(L) 128(L)  ?Potassium 3.5 - 5.1 mmol/L 3.6 3.5 3.6  ?Chloride 98 - 111 mmol/L 90(L) 87(L) 87(L)  ?CO2 22 - 32 mmol/L 26 29 27   ?Calcium 8.9 - 10.3 mg/dL 8.7(L) 9.0 8.4(L)  ? ? ? ?Objective:  ? ?Vitals:  ? 02/02/22 0913 02/02/22 1012  ?BP: 124/69 94/69  ?Pulse: 92 97  ?Resp: Marland Kitchen)  24 18  ?Temp: 99.2 ?F (37.3 ?C) 100.1 ?F (37.8 ?C)  ?SpO2: 100% 100%  ? ? ?General:AA&O x 3. Normal mood and affect  ? ?Vascular: DP and PT pulses 2/4 bilateral. Brisk capillary refill to all digits. Pedal hair present  ? ?Neruological. Epicritic sensation grossly intact.  ? ?Derm: Plantar fifth residual metatarsal wound with hyperkeratotic necrotic base measuring 0.1 cm . Crepitus noted on palpation of lateral foot with mild erythema extending proximally to the ankle. . No active purulence noted. no probe to bone. Interspaces clears of maceration. Nails well groomed and normal in  appearance ? ?MSK: MMT 5/5 in dorsiflexion, plantar flexion, inversion and eversion. Normal joint ROM without pain or crepitus.  ? ? ? ? ?Assessment & Plan:  ?Patient was evaluated and treated and all questions answered. ? ?DX: Right fifth metatarsal osteomyelitis   ?Wound care: Betadine, DSD  ?Antibiotics: Per primary  ?DME: post-op shoe   ?Discussed with patient diagnosis and treatment options.  ?Imaging reviewed. XR showing osteomyelitis of the fifth metatarsal and gas surrounding the area of the fifth metatarsal. Pending MRI  ?Vascular studies done 01/29/22. Show normal right ABI and TBI.  Left ABI non- compressible and abnormal TBI on left.  ?Unable to discuss plan much with the patient as he was in and out of sleep. He will need urgent wash out in the OR today. Will plan for incision and drainage and revision of fifth ray amputation. Unclear when patient last ate or drank. Will continue to try and contact family for consent for surgery.   ? ?Was able to get in contact with Son [name] [name]. Discussed surgical plan with him. Relates that he believes patient last ate around 6 pm to 9 pm last night. Relates he will consent to wash out and revision fifth metatarsal amputation for his dad.  ? ?[name] Peck, MD ? ?Accessible via secure chat for questions or concerns. ? ?

## 2022-02-02 NOTE — Assessment & Plan Note (Addendum)
-  Intermittent hypotension, concern that this may have resulted in watershed infarcts. ?-Blood pressure stable. ?-Hydralazine as needed. ?-Continue midodrine pre-HD. ?-Continue to Monitor BP per Protocol; Last BP reading was 143/79 ?

## 2022-02-02 NOTE — ED Notes (Signed)
Dr Christy Gentles confirmed that he wants pt to have total of 2 L of LR  ?

## 2022-02-02 NOTE — ED Triage Notes (Signed)
Pt brought in by EMS for AMS, pt lives alone and is on dialysis. Family does not know when pt went to dialysis last, pt is not answering questions. Pt family told EMS the last seen normal was yesterday morning via Facetime. Pt appears to be moving all extremities, no facial droop noted, unable to perform NIHSS due to AMS and pt inability to follow commands at this time. Rectal temp 104.2 at this time. DR Christy Gentles present.   ?

## 2022-02-02 NOTE — Assessment & Plan Note (Addendum)
-  Currently has been weaned to RA with sats of 98-100%, continue to monitor ?-SpO2: 100 % ?O2 Flow Rate (L/min): 2 L/min; Now not on Supplemental O2 ?-CXR 3/20 without frank edema ?

## 2022-02-02 NOTE — Brief Op Note (Signed)
02/02/2022 ? ?12:09 PM ? ?PATIENT:  [name] Cruel Sr.  61 y.o. male ? 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 ?POST-OPERATIVE DIAGNOSIS:  Same as pre-op ? ?PROCEDURE:  Procedure(s): ?IRRIGATION AND DEBRIDEMENT RIGHT FOOT (Right) ?AMPUTATION 5th RAY (Right) ? ?SURGEON:  Surgeon(s) and Role: ?   [name], MD - Primary ? ?PHYSICIAN ASSISTANT:  ? ?ASSISTANTS: none  ? ?ANESTHESIA:   local and MAC ? ?EBL:  <50 ml ? ?BLOOD ADMINISTERED:none ? ?DRAINS: none  ? ?LOCAL MEDICATIONS USED:  MARCAINE    and XYLOCAINE  ? ?SPECIMEN:  Source of Specimen:  Right fifth metatrarsal  ? ?DISPOSITION OF SPECIMEN:   Pathology and culture  ? ?COUNTS:  YES ? ?TOURNIQUET:  No TQ  ? ?DICTATION: .Note written in EPIC ? ?PLAN OF CARE: Admit to inpatient  ? ?PATIENT DISPOSITION:  PACU - hemodynamically stable. ?  ?Delay start of Pharmacological VTE agent (>24hrs) due to surgical blood loss or risk of bleeding: yes ? ?

## 2022-02-02 NOTE — Progress Notes (Addendum)
NEW ADMISSION NOTE ?New Admission Note:  ? ?Arrival Method: EMS stretcher bed ?Mental Orientation: Alert to self ?Telemetry:NSR ?Assessment: Completed ?Skin:Se flowsheet.Assessed with Ulice Dash R.N. DM wound 3 x 2 Right ventral foot. ?GG:YIRSW upper arm-NSL ?Pain:Denies ?Tubes:None ?Safety Measures: Safety Fall Prevention Plan has been given, discussed and signed ?Admission: Completed ?[facility] Orientation: Patient has been orientated to the room, unit and staff.  ?Family: ? ?Orders have been reviewed and implemented. Will continue to monitor the patient. Call light has been placed within reach and bed alarm has been activated.  ? ?Rogelia Mire, RN   ?

## 2022-02-02 NOTE — Progress Notes (Signed)
Inpatient Diabetes Program Recommendations ? ?AACE/ADA: New Consensus Statement on Inpatient Glycemic Control (2015) ? ?Target Ranges:  Prepandial:   less than 140 mg/dL ?     Peak postprandial:   less than 180 mg/dL (1-2 hours) ?     Critically ill patients:  140 - 180 mg/dL  ? ?Lab Results  ?Component Value Date  ? GLUCAP 367 (H) 02/02/2022  ? HGBA1C 9.9 (H) 08/12/2019  ? ? ?Review of Glycemic Control ? Latest Reference Range & Units 02/02/22 01:04 02/02/22 04:40 02/02/22 08:24  ?Glucose-Capillary 70 - 99 mg/dL 468 (H) 352 (H) 367 (H)  ?(H): Data is abnormally high ? ?Diabetes history: DM2 ?Outpatient Diabetes medications:70/30 20 units bid ?Current orders for Inpatient glycemic control: Semglee 20 units q hs, Novolog 0-20 units tid correction ? ?Inpatient Diabetes Program Recommendations:   ?Patient currently in ED. ?Pending A1c. ?Please consider: ?-Change Semglee to q am and start now ?Secure chat to D. [facility]. ? ?Thank you, ?Nani Gasser Latresa Gasser, RN, MSN, CDE  ?Diabetes Coordinator ?Inpatient Glycemic Control Team ?Team Pager [phone] (8am-5pm) ?02/02/2022 9:01 AM ? ? ? ? ?

## 2022-02-02 NOTE — Anesthesia Preprocedure Evaluation (Signed)
Anesthesia Evaluation  ?Patient identified by MRN, date of birth, ID band ?Patient awake ? ? ? ?Reviewed: ?Allergy & Precautions, H&P , NPO status , Patient's Chart, lab work & pertinent test results ? ?Airway ?Mallampati: II ? ? ?Neck ROM: full ? ? ? Dental ?  ?Pulmonary ?neg pulmonary ROS, Patient abstained from smoking.,  ?  ?breath sounds clear to auscultation ? ? ? ? ? ? Cardiovascular ?hypertension, +CHF  ? ?Rhythm:regular Rate:Normal ? ?EF 25% ?  ?Neuro/Psych ?  ? GI/Hepatic ?GERD  ,  ?Endo/Other  ?diabetes, Type 2 ? Renal/GU ?ESRF and DialysisRenal disease  ? ?  ?Musculoskeletal ? ? Abdominal ?  ?Peds ? Hematology ?  ?Anesthesia Other Findings ? ? Reproductive/Obstetrics ? ?  ? ? ? ? ? ? ? ? ? ? ? ? ? ?  ?  ? ? ? ? ? ? ? ? ?Anesthesia Physical ?Anesthesia Plan ? ?ASA: 4 ? ?Anesthesia Plan: MAC and Regional  ? ?Post-op Pain Management:   ? ?Induction: Intravenous ? ?PONV Risk Score and Plan: 1 and Ondansetron, Propofol infusion, Treatment may vary due to age or medical condition and Midazolam ? ?Airway Management Planned: Simple Face Mask ? ?Additional Equipment:  ? ?Intra-op Plan:  ? ?Post-operative Plan:  ? ?Informed Consent: I have reviewed the patients History and Physical, chart, labs and discussed the procedure including the risks, benefits and alternatives for the proposed anesthesia with the patient or authorized representative who has indicated his/her understanding and acceptance.  ? ? ? ?Dental advisory given ? ?Plan Discussed with: CRNA, Anesthesiologist and Surgeon ? ?Anesthesia Plan Comments:   ? ? ? ? ? ? ?Anesthesia Quick Evaluation ? ?

## 2022-02-02 NOTE — Assessment & Plan Note (Addendum)
-  EF 45% per echo 05/2021 ?-Echo 3/22 with EF 45-50% ?-Volume maintenance and Fluid management per Nephrology. ?

## 2022-02-02 NOTE — Op Note (Signed)
? ?  OPERATIVE REPORT ?Patient name: [name] HONEYMAN Sr. ?MRN: [id number] ?DOB: 04/15/1961 ? ?DOS:  02/02/22 ? ?Preop Dx: ESRD (end stage renal disease) ([place]) ? ?Delirium ? ?Right foot infection ?Postop Dx: same ? ?Procedure:  ?1. Right foot incision and drainage and revision of fifth ray amputation  ? ?Surgeon: [name], DPM ? ?Anesthesia: 50-50 mixture of 2% lidocaine plain with 0.5% Marcaine plain totaling 20 infiltrated in the patient's right lower extremity ? ?Hemostasis: N/a ? ?EBL: <50 mL ?Materials: None ?Injectables: as above ?Pathology: Right fifth metatarsal for culture and pathology ? ?Condition: The patient tolerated the procedure and anesthesia well. No complications noted or reported ? ? ?Justification for procedure: The patient is a 61 y.o. male  who presents today for surgical management of right foot osteomyelitis and gas gangrene. All conservative modalities of been unsuccessful in providing any sort of satisfactory alleviation of symptoms with the patient. The patient and sone were told benefits as well as possible side effects of the surgery. The patient consented for surgical correction via his sone. The patient consent form was reviewed. All patient and family questions were answered. No guarantees were expressed or implied. The patient's son via verbal consent and the surgeon both signed the patient consent form with the witness present and placed in the patient's chart. ? ? ?Procedure in Detail: The patient was brought to the operating room, placed in the operating table in the supine position at which time an aseptic scrub and drape were performed about the patient's respective lower extremity after anesthesia was induced as described above. Attention was then directed to the surgical area where procedure number one commenced. ? ?Procedure #1:  ?Attention was directed to the residual right fifth metatarsal and area of wound. Incision was made around small plantar wound and elipsed out.   Incision was made down to bone. Upon incision purulence was noted and swabbed for culture..  All infected skin in the area was resected out using scalpel and rongeur. Any necrotic tissue was removed to healthy bleeding tissue. The residual fifth metatarsal was resected more proximally with sagital saw leaving a harder healthier residual bone.  This was sent for pathology.  The area was irrigated copiously with 3 L sterile saline with pulse lavage. Residual bone was sent to microbiology. Any bleeders noted were cauterized as necessary. The remaining wound measured about 6 cm x 5 cm x 2 cm and was dressed with saline WTD gauze, abd, kerlex and ace wrap.  ? ?The patient was then transferred from the operating room to the recovery room having tolerated the procedure and anesthesia well. All vital signs are stable. After a brief stay in the recovery room the patient was sent back to inpatient room.  ? ?Will follow cultures. Podiatry will continue to follow.  ? ?[name], DPM ?Real ? ?Dr. [name], DPM  ?  ?[address]                                       ?[place], [place] [address]                ?Office [phone]  ?Fax [phone] ? ? ?

## 2022-02-02 NOTE — Assessment & Plan Note (Addendum)
-  Hemoglobimn a1c 12.2 (02/02/2022). ?-CBG's ranging from 97-202 ?-Increased Semglee to 22 units daily.   ?-C/w Moderate Novolog SSI AC/HS ? ?

## 2022-02-02 NOTE — Transfer of Care (Signed)
Immediate Anesthesia Transfer of Care Note ? ?Patient: [name] KOHLES Sr. ? ?Procedure(s) Performed: IRRIGATION AND DEBRIDEMENT RIGHT FOOT (Right) ?AMPUTATION 5th RAY (Right) ? ?Patient Location: PACU ? ?Anesthesia Type:MAC ? ?Level of Consciousness: drowsy ? ?Airway & Oxygen Therapy: Patient Spontanous Breathing and Patient connected to face mask oxygen ? ?Post-op Assessment: Report given to RN and Post -op Vital signs reviewed and stable ? ?Post vital signs: Reviewed and stable ? ?Last Vitals:  ?Vitals Value Taken Time  ?BP 130/65 02/02/22 1252  ?Temp    ?Pulse 90 02/02/22 1254  ?Resp 25 02/02/22 1254  ?SpO2 99 % 02/02/22 1254  ?Vitals shown include unvalidated device data. ? ?Last Pain:  ?Vitals:  ? 02/02/22 1155  ?TempSrc:   ?PainSc: 0-No pain  ?   ? ?  ? ?Complications: No notable events documented. ?

## 2022-02-02 NOTE — Assessment & Plan Note (Deleted)
Patient currently n.p.o. with mental status, SLP eval ? ?

## 2022-02-02 NOTE — Consult Note (Signed)
[facility] ?Renal Consultation Note  ?  ?Indication for Consultation:  Management of ESRD/hemodialysis; anemia, hypertension/volume and secondary hyperparathyroidism ? ?[place] ? ?HPI: [name] KRAY Sr. is a 61 y.o. male with ESRD on HD MWF at [facility]. His past medical history is significant for DM, HTN, gout, and HFrEF. ? ?Patient is a transfer from [facility] who presented to the ED with altered mental status. Patient found to be tachycardic, tachypnea,  (+) leukocytosis, febrile, and nonhealing diabetic foot ulcer. MRI R foot pending. Patient being evaluated for sepsis/osteomyelitis. On IV vanc/cefepime/flagyl. Podiatry is following. Patient underwent right foot I&D and revision of 5th ray amputation today. Seen and examined patient at bedside after procedure. Patient currently lethargic and not opening his eyes. Moans to voice. Current temp 100.3 and Bps soft/stable. Patient received full HD on 01/31/22. Notable labs include: WBC 12.4, Lactic Acid 2.5, K+ 3.6, BUN 43, SrCr 11.09, Ca 8.7, and Hgb 11.8. CXR shows mild cardiomegaly with pulmonary vascular engorgement. Plan for HD 3/20 per his routine schedule. ? ?Past Medical History:  ?Diagnosis Date  ? CHF (congestive heart failure) ([place])   ? Diabetes mellitus without complication ([place])   ? type 2  ? ESRD on hemodialysis ([place])   ? mon wed fri  ? Hypertension   ? Kidney failure   ? Myocardial infarction [facility])   ? Nonischemic cardiomyopathy ([place])   ? ?Past Surgical History:  ?Procedure Laterality Date  ? A/V FISTULAGRAM N/A 08/15/2019  ? Procedure: A/V FISTULAGRAM;  Surgeon: [name], MD;  Location: [place] [facility];  Service: Cardiovascular;  Laterality: N/A;  ? AV FISTULA PLACEMENT    ? CARDIAC CATHETERIZATION  02/17/2018  ? LHC 02/17/18 ([name]): 30% pLAD, 30% dRCA, LVEDP 25-30, LVEF 25%.    ? CATARACT EXTRACTION W/PHACO Right 03/29/2020  ? Procedure: CATARACT  EXTRACTION PHACO AND INTRAOCULAR LENS PLACEMENT ([place])  RIGHT DIABETIC;  Surgeon: [name], MD;  Location: [facility] ORS;  Service: Ophthalmology;  Laterality: Right;  Lot # [id number] H ?[place]: 02:30.9 ?AP% 9.5% ?CDE: 14.38  ? EYE SURGERY Right   ? cataract removed  ? INSERTION OF DIALYSIS CATHETER  08/13/2019  ? Procedure: Insertion Of Dialysis Catheter;  Surgeon: [name], MD;  Location: [facility] OR;  Service: Vascular;;  ? INSERTION OF DIALYSIS CATHETER Left 07/19/2020  ? Procedure: INSERTION OF DIALYSIS CATHETER;  Surgeon: [name], MD;  Location: [place];  Service: Vascular;  Laterality: Left;  ? IRRIGATION AND DEBRIDEMENT ABSCESS    ? PERIPHERAL VASCULAR BALLOON ANGIOPLASTY  08/15/2019  ? Procedure: PERIPHERAL VASCULAR BALLOON ANGIOPLASTY;  Surgeon: [name], MD;  Location: [place] [facility];  Service: Cardiovascular;;  ? REVISON OF ARTERIOVENOUS FISTULA Left 07/19/2020  ? Procedure: REVISON/PLICATION OF ARTERIOVENOUS FISTULA LEFT;  Surgeon: [name], MD;  Location: [place];  Service: Vascular;  Laterality: Left;  ? THROMBECTOMY AND REVISION OF ARTERIOVENTOUS (AV) GORETEX  GRAFT Left 08/13/2019  ? Procedure: THROMBECTOMY OF LEFT ARM ARTERIOVENTOUS (AV) FISTULA;  Surgeon: [name], MD;  Location: [place];  Service: Vascular;  Laterality: Left;  ? TOE AMPUTATION    ? ?Family History  ?Problem Relation Age of Onset  ? Hypertension Mother   ? ?Social History: ? reports that he has never smoked. He has never used smokeless tobacco. He reports that he does not currently use alcohol. He reports that he does not use drugs. ?Allergies  ?Allergen Reactions  ? Sulfa Antibiotics  Nausea And Vomiting  ? ?Prior to Admission medications   ?Medication Sig Start Date End Date Taking? Authorizing Provider  ?lidocaine-prilocaine (EMLA) cream Apply 1 application topically every Monday, Wednesday, and Friday with hemodialysis.  08/17/18  Yes [provider]  ?sucroferric  oxyhydroxide (VELPHORO) 500 MG chewable tablet Chew 1,000 mg by mouth 3 (three) times daily with meals.   Yes [provider]  ?TRESIBA FLEXTOUCH 100 UNIT/ML FlexTouch Pen Inject 30 Units into the skin daily. 07/16/20  Yes [provider]  ?famotidine (PEPCID) 20 MG tablet Take 1 tablet (20 mg total) by mouth at bedtime. ?Patient not taking: Reported on 02/02/2022 08/16/19   Kinnie Feil, MD  ? ?Current Facility-Administered Medications  ?Medication Dose Route Frequency Provider Last Rate Last Admin  ? acetaminophen (TYLENOL) tablet 650 mg  650 mg Oral Q6H PRN [name], MD      ? Or  ? acetaminophen (TYLENOL) suppository 650 mg  650 mg Rectal Q6H PRN [name], MD      ? albuterol (PROVENTIL) (2.5 MG/3ML) 0.083% nebulizer solution 2.5 mg  2.5 mg Nebulization Q2H PRN [name], MD      ? Derrill Memo ON 02/03/2022] ceFEPIme (MAXIPIME) 2 g in sodium chloride 0.9 % 100 mL IVPB  2 g Intravenous Q M,W,F-2000 [name], MD      ? famotidine (PEPCID) tablet 20 mg  20 mg Oral BID [name], MD      ? heparin injection 5,000 Units  5,000 Units Subcutaneous Q8H [name], MD   5,000 Units at 02/02/22 1941  ? insulin aspart (novoLOG) injection 0-20 Units  0-20 Units Subcutaneous TID WC [name], MD   20 Units at 02/02/22 [phone]  ? insulin glargine-yfgn (SEMGLEE) injection 20 Units  20 Units Subcutaneous Daily [name], MD      ? lactated ringers bolus 1,000 mL  1,000 mL Intravenous Once [name], MD      ? metroNIDAZOLE (FLAGYL) tablet 500 mg  500 mg Oral Q12H [name], MD      ? morphine (PF) 2 MG/ML injection 2 mg  2 mg Intravenous Q2H PRN [name], MD      ? ondansetron [facility]) tablet 4 mg  4 mg Oral Q6H PRN [name], MD      ? Or  ? ondansetron (ZOFRAN) injection 4 mg  4 mg Intravenous Q6H PRN [name], MD      ? oxyCODONE (Oxy IR/ROXICODONE) immediate release tablet 5 mg  5 mg Oral Q4H PRN [name], MD      ?  pantoprazole (PROTONIX) EC tablet 40 mg  40 mg Oral Daily [name], MD      ? polyethylene glycol (MIRALAX / GLYCOLAX) packet 17 g  17 g Oral Daily PRN [name], MD      ? sucroferric oxyhydroxide [facility]) chewable tablet 1,000 mg  1,000 mg Oral TID WC [name], MD      ? Derrill Memo ON 02/03/2022] vancomycin (VANCOCIN) IVPB 1000 mg/200 mL premix  1,000 mg Intravenous Q M,W,F-HD [name], MD      ? ?Labs: ?Basic Metabolic Panel: ?Recent Labs  ?Lab 02/02/22 ?0107 02/02/22 ?0604  ?NA 129* 131*  ?K 3.5 3.6  ?CL 87* 90*  ?CO2 29 26  ?GLUCOSE 479* 376*  ?BUN 42* 43*  ?CREATININE 11.09* 11.09*  ?CALCIUM 9.0 8.7*  ? ?Liver Function Tests: ?Recent Labs  ?Lab 02/02/22 ?0107 02/02/22 ?0604  ?AST 25 29  ?ALT 18 18  ?ALKPHOS 76  71  ?BILITOT 0.6 0.8  ?PROT 8.0 7.5  ?ALBUMIN 3.0* 2.9*  ? ?No results for input(s): LIPASE, AMYLASE in the last 168 hours. ?No results for input(s): AMMONIA in the last 168 hours. ?CBC: ?Recent Labs  ?Lab 02/02/22 ?0107 02/02/22 ?0920  ?WBC 12.0* 12.4*  ?NEUTROABS 9.1*  --   ?HGB 12.0* 11.8*  ?HCT 37.1* 37.0*  ?MCV 95.9 96.1  ?PLT 171 155  ? ?Cardiac Enzymes: ?No results for input(s): CKTOTAL, CKMB, CKMBINDEX, TROPONINI in the last 168 hours. ?CBG: ?Recent Labs  ?Lab 02/02/22 ?0440 02/02/22 ?0824 02/02/22 ?1119 02/02/22 ?1257 02/02/22 ?1420  ?GLUCAP 352* 367* 328* 326* 229*  ? ?Iron Studies: No results for input(s): IRON, TIBC, TRANSFERRIN, FERRITIN in the last 72 hours. ?Studies/Results: ?CT Head Wo Contrast ? ?Result Date: 02/02/2022 ?CLINICAL DATA:  Delirium, altered mental status EXAM: CT HEAD WITHOUT CONTRAST TECHNIQUE: Contiguous axial images were obtained from the base of the skull through the vertex without intravenous contrast. RADIATION DOSE REDUCTION: This exam was performed according to the departmental dose-optimization program which includes automated exposure control, adjustment of the mA and/or kV according to patient size and/or use of iterative reconstruction  technique. COMPARISON:  08/05/2019 FINDINGS: Brain: Normal anatomic configuration. Parenchymal volume loss is commensurate with the patient's age. Mild periventricular white matter changes are present likely reflecting the

## 2022-02-02 NOTE — Progress Notes (Signed)
Patient seen upon arrival to [facility] confused and is sleepy.  He was admitted earlier this morning for right foot infection, concern for osteomyelitis as well as sepsis physiology.  Discussed with Dr. Roderic Palau this morning, he consulted podiatry.  An MRI of the foot is pending.  I will call nephrology as he is an ESRD patient. Continue to closely monitor. ? ?Scheduled Meds: ? famotidine  20 mg Oral BID  ? heparin  5,000 Units Subcutaneous Q8H  ? insulin aspart  0-20 Units Subcutaneous TID WC  ? insulin glargine-yfgn  20 Units Subcutaneous Daily  ? metroNIDAZOLE  500 mg Oral Q12H  ? pantoprazole  40 mg Oral Daily  ? sucroferric oxyhydroxide  1,000 mg Oral TID WC  ? ?Continuous Infusions: ? [START ON 02/03/2022] ceFEPime (MAXIPIME) IV    ? lactated ringers    ? [START ON 02/03/2022] vancomycin    ? ?PRN Meds:.acetaminophen **OR** acetaminophen, albuterol, morphine injection, ondansetron **OR** ondansetron (ZOFRAN) IV, oxyCODONE, polyethylene glycol ? ?Rumaldo Difatta M. Cruzita Lederer, MD, PhD ?[facility] ? ?Between 7 am - 7 pm you can contact me via Amion (for emergencies) or Securechat (non urgent matters).  ?I am not available 7 pm - 7 am, please contact night coverage MD/APP via Amion ?

## 2022-02-02 NOTE — Progress Notes (Signed)
?   02/02/22 1729  ?Assess: MEWS Score  ?Temp (!) 102.8 ?F (39.3 ?C)  ?BP (!) 111/45  ?Pulse Rate 99  ?Resp 18  ?SpO2 94 %  ?O2 Device Room Air  ?Assess: MEWS Score  ?MEWS Temp 2  ?MEWS Systolic 0  ?MEWS Pulse 0  ?MEWS RR 0  ?MEWS LOC 0  ?MEWS Score 2  ?MEWS Score Color Yellow  ?Assess: if the MEWS score is Yellow or Red  ?Were vital signs taken at a resting state? Yes  ?Focused Assessment No change from prior assessment  ?Early Detection of Sepsis Score *See Row Information* Medium  ?MEWS guidelines implemented *See Row Information* No, altered LOC is baseline  ?Treat  ?Pain Scale 0-10  ?Pain Score 0  ?Take Vital Signs  ?Increase Vital Sign Frequency  Yellow: Q 2hr X 2 then Q 4hr X 2, if remains yellow, continue Q 4hrs  ?Escalate  ?MEWS: Escalate Yellow: discuss with charge nurse/RN and consider discussing with provider and RRT  ?Notify: Charge Nurse/RN  ?Name of Charge Nurse/RN Notified Ulice Dash R.N.  ?Date Charge Nurse/RN Notified 02/02/22  ?Time Charge Nurse/RN Notified 1735  ?Notify: Provider  ?Provider Name/Title Dr Renne Crigler  ?Date Provider Notified 02/02/22  ?Time Provider Notified 1735  ?Notification Type Page  ?Notification Reason Other (Comment)  ?Provider response See new orders  ?Date of Provider Response 02/02/22  ?Time of Provider Response 1740  ?Notify: Rapid Response  ?Name of Rapid Response RN Notified N/A  ? ? ?

## 2022-02-02 NOTE — ED Notes (Signed)
2nd set of blood cultures drawn by lab ?

## 2022-02-02 NOTE — Assessment & Plan Note (Addendum)
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 ?Appreciate ID assistance, continuing vanc, flagyl, and cefepime - low suspicion for endocarditis per ID, TTE without eivdence of valvular vegetations on TTE (per ID, "do not feel strongly regarding getting TEE from ID standpoint given low grade bacteremia with GNR/GPR with right foot being source, would defer need of TEE to neurology if helpful for stroke workup - neurology did not recommend from their standpoint).  Recommending 6 weeks of abx after last Debridement intervention (from 3/25). ?-Vancomycin/Cefepime during HD and p.o. Flagyl ? ?

## 2022-02-02 NOTE — Significant Event (Addendum)
Rapid Response Event Note  ? ?Reason for Call :  ?Decreased LOC, hypotension-80s ? ?Initial Focused Assessment:  ?Pt lying in bed with eyes closed, responsive only to painful stimulation. Pt having periods of apnea. Lungs clear/diminished. Skin cool to touch. ? ?T-98, HR-79, BP-82/52, RR-18, SpO2-94% on RA ? ?Interventions:  ?CBG-238 ?ABG-7.4/37/131/23.5 ?250cc NS bolus ?EKG-NSR, T wave abnormality ?PCXR-CM. No frank interstitial edema ?CBC ?CMP ?LA-2.6 ?Trop-505 ?Narcan 0.4mg  IV x 1-no response ?Neuro consulted: ?CT head-No acute intracranial abnormality. Stable senescent change and remote lacunar infarcts. ?CTA ?CT venogram ?PCCM consulted: ?500cc NS bolus ?Narcan .4mg  x 1 ?Narcan 2mg  x 1 ?Narcan gtt ?NPO/CPAP ?Tx to ICU  ?Plan of Care:  ?Tx to 3M03.  ? ? ?Event Summary:  ? ?MD Notified: Dr. Marlowe Sax notified and came to beside immediately. ?Call Time:2346 ?Arrival QAST:4196 ?End QIWL:7989 ? ?Dillard Essex, RN ?

## 2022-02-02 NOTE — Hospital Course (Addendum)
61 y/o male history of DM, ESRD on HD, admitted to the hospital with fever, acute encephalopathy, sepsis and right foot infection.  He has a history of prior right fifth metatarsal amputation performed many years ago in [place].  Plain films of his foot indicate swelling and subcutaneous gas without any obvious bony erosion. [facility] presented to [facility] and [facility] to [facility].  Podiatry consulted, he was taken emergently to the [facility] on 3/19.  Postoperatively he has had persistent lethargy and was taken to the ICU 3/19 evening.  He was placed on Narcan drip for presumed lethargy due to opioid use however he only got 5 mg of oxycodone.  Narcan drip was discontinued on 3/20 and he was transferred out of the ICU. He continued to have altered mental status and was found to have findings consistent with acute strokes.  Neurology was consulted and suspects related to hypotension.  His mental status has gradually improved.  Blood cultures were positive with morganella and clostridium related to his foot infection.  ID has been consulted and recommended 6 weeks of abx after last debridement.  He went back to OR on 3/25 for I&D with podiatry.  Hospitalization c/b malfunctioning L AV fistula, he's s/p fistulogram with vascular on 3/23.  At this time, plan is for d/c to SNF when bed available.  He had nausea/vomiting of unclear etiology 3/28 with now improving. ? ?See below for additional details ? ?**He continues to get dialysis and now has been accepted at [facility] on TTS schedule and we can start on Saturday; he was dialyzed today.  Will discharge to SNF once cleared by nephrology and possible dialysis session tomorrow.Marland Kitchen  His wound VAC is being changed and will continue wound VAC changes Monday Wednesday Friday.  ?

## 2022-02-02 NOTE — ED Notes (Signed)
First set of blood cultures drawn. Pt has pinpoint pupils- pt states "its been a while" when this nurse asked when was the last time he had dialysis. Pt had something in his mouth, pt says "gum" instructed pt to spit it out- pt spits out big lump of chewed food. Dr Christy Gentles made aware of all findings.  ?

## 2022-02-03 ENCOUNTER — Inpatient Hospital Stay (HOSPITAL_COMMUNITY): Payer: Medicare Other

## 2022-02-03 ENCOUNTER — Encounter (HOSPITAL_COMMUNITY): Payer: Self-pay | Admitting: Podiatry

## 2022-02-03 DIAGNOSIS — M869 Osteomyelitis, unspecified: Secondary | ICD-10-CM | POA: Diagnosis not present

## 2022-02-03 DIAGNOSIS — R4182 Altered mental status, unspecified: Secondary | ICD-10-CM | POA: Diagnosis not present

## 2022-02-03 DIAGNOSIS — D631 Anemia in chronic kidney disease: Secondary | ICD-10-CM

## 2022-02-03 DIAGNOSIS — E11628 Type 2 diabetes mellitus with other skin complications: Secondary | ICD-10-CM | POA: Diagnosis not present

## 2022-02-03 DIAGNOSIS — M7989 Other specified soft tissue disorders: Secondary | ICD-10-CM

## 2022-02-03 DIAGNOSIS — E1169 Type 2 diabetes mellitus with other specified complication: Secondary | ICD-10-CM

## 2022-02-03 DIAGNOSIS — N189 Chronic kidney disease, unspecified: Secondary | ICD-10-CM

## 2022-02-03 DIAGNOSIS — J9601 Acute respiratory failure with hypoxia: Secondary | ICD-10-CM | POA: Diagnosis not present

## 2022-02-03 DIAGNOSIS — G9341 Metabolic encephalopathy: Secondary | ICD-10-CM | POA: Diagnosis not present

## 2022-02-03 DIAGNOSIS — E8729 Other acidosis: Secondary | ICD-10-CM

## 2022-02-03 DIAGNOSIS — M86171 Other acute osteomyelitis, right ankle and foot: Secondary | ICD-10-CM | POA: Diagnosis not present

## 2022-02-03 DIAGNOSIS — E875 Hyperkalemia: Secondary | ICD-10-CM

## 2022-02-03 DIAGNOSIS — L089 Local infection of the skin and subcutaneous tissue, unspecified: Secondary | ICD-10-CM

## 2022-02-03 DIAGNOSIS — E871 Hypo-osmolality and hyponatremia: Secondary | ICD-10-CM

## 2022-02-03 DIAGNOSIS — L03119 Cellulitis of unspecified part of limb: Secondary | ICD-10-CM

## 2022-02-03 LAB — COMPREHENSIVE METABOLIC PANEL
ALT: 19 U/L (ref 0–44)
ALT: 17 U/L (ref 0–44)
AST: 27 U/L (ref 15–41)
AST: 27 U/L (ref 15–41)
Albumin: 2.7 g/dL — ABNORMAL LOW (ref 3.5–5.0)
Albumin: 2.6 g/dL — ABNORMAL LOW (ref 3.5–5.0)
Alkaline Phosphatase: 69 U/L (ref 38–126)
Alkaline Phosphatase: 68 U/L (ref 38–126)
Anion gap: 16 — ABNORMAL HIGH (ref 5–15)
Anion gap: 16 — ABNORMAL HIGH (ref 5–15)
BUN: 48 mg/dL — ABNORMAL HIGH (ref 6–20)
BUN: 47 mg/dL — ABNORMAL HIGH (ref 6–20)
CO2: 23 mmol/L (ref 22–32)
CO2: 23 mmol/L (ref 22–32)
Calcium: 8.9 mg/dL (ref 8.9–10.3)
Calcium: 9 mg/dL (ref 8.9–10.3)
Chloride: 91 mmol/L — ABNORMAL LOW (ref 98–111)
Chloride: 92 mmol/L — ABNORMAL LOW (ref 98–111)
Creatinine, Ser: 13.19 mg/dL — ABNORMAL HIGH (ref 0.61–1.24)
Creatinine, Ser: 13.1 mg/dL — ABNORMAL HIGH (ref 0.61–1.24)
GFR, Estimated: 4 mL/min — ABNORMAL LOW (ref >60)
GFR, Estimated: 4 mL/min — ABNORMAL LOW (ref >60)
Glucose, Bld: 282 mg/dL — ABNORMAL HIGH (ref 70–99)
Glucose, Bld: 278 mg/dL — ABNORMAL HIGH (ref 70–99)
Potassium: 5.3 mmol/L — ABNORMAL HIGH (ref 3.5–5.1)
Potassium: 5.5 mmol/L — ABNORMAL HIGH (ref 3.5–5.1)
Sodium: 130 mmol/L — ABNORMAL LOW (ref 135–145)
Sodium: 131 mmol/L — ABNORMAL LOW (ref 135–145)
Total Bilirubin: 0.9 mg/dL (ref 0.3–1.2)
Total Bilirubin: 0.8 mg/dL (ref 0.3–1.2)
Total Protein: 8 g/dL (ref 6.5–8.1)
Total Protein: 7.8 g/dL (ref 6.5–8.1)

## 2022-02-03 LAB — MRSA NEXT GEN BY PCR, NASAL: MRSA by PCR Next Gen: NOT DETECTED

## 2022-02-03 LAB — GLUCOSE, CAPILLARY
Glucose-Capillary: 310 mg/dL — ABNORMAL HIGH (ref 70–99)
Glucose-Capillary: 369 mg/dL — ABNORMAL HIGH (ref 70–99)
Glucose-Capillary: 391 mg/dL — ABNORMAL HIGH (ref 70–99)
Glucose-Capillary: 366 mg/dL — ABNORMAL HIGH (ref 70–99)
Glucose-Capillary: 164 mg/dL — ABNORMAL HIGH (ref 70–99)
Glucose-Capillary: 226 mg/dL — ABNORMAL HIGH (ref 70–99)

## 2022-02-03 LAB — CBC
HCT: 36.2 % — ABNORMAL LOW (ref 39.0–52.0)
HCT: 38.8 % — ABNORMAL LOW (ref 39.0–52.0)
Hemoglobin: 11.6 g/dL — ABNORMAL LOW (ref 13.0–17.0)
Hemoglobin: 12.3 g/dL — ABNORMAL LOW (ref 13.0–17.0)
MCH: 30.2 pg (ref 26.0–34.0)
MCH: 30.2 pg (ref 26.0–34.0)
MCHC: 31.7 g/dL (ref 30.0–36.0)
MCHC: 32 g/dL (ref 30.0–36.0)
MCV: 94.3 fL (ref 80.0–100.0)
MCV: 95.3 fL (ref 80.0–100.0)
Platelets: 150 10*3/uL (ref 150–400)
Platelets: 160 10*3/uL (ref 150–400)
RBC: 3.84 MIL/uL — ABNORMAL LOW (ref 4.22–5.81)
RBC: 4.07 MIL/uL — ABNORMAL LOW (ref 4.22–5.81)
RDW: 16 % — ABNORMAL HIGH (ref 11.5–15.5)
RDW: 16.2 % — ABNORMAL HIGH (ref 11.5–15.5)
WBC: 11.6 10*3/uL — ABNORMAL HIGH (ref 4.0–10.5)
WBC: 13.6 10*3/uL — ABNORMAL HIGH (ref 4.0–10.5)
nRBC: 0.1 % (ref 0.0–0.2)
nRBC: 0.2 % (ref 0.0–0.2)

## 2022-02-03 LAB — APTT: aPTT: 33 seconds (ref 24–36)

## 2022-02-03 LAB — PHOSPHORUS
Phosphorus: 3.1 mg/dL (ref 2.5–4.6)
Phosphorus: 3.4 mg/dL (ref 2.5–4.6)

## 2022-02-03 LAB — BLOOD GAS, ARTERIAL
Acid-base deficit: 1.1 mmol/L (ref 0.0–2.0)
Bicarbonate: 23.5 mmol/L (ref 20.0–28.0)
O2 Saturation: 99.4 %
Patient temperature: 36.7
pCO2 arterial: 37 mmHg (ref 32–48)
pH, Arterial: 7.4 (ref 7.35–7.45)
pO2, Arterial: 131 mmHg — ABNORMAL HIGH (ref 83–108)

## 2022-02-03 LAB — BASIC METABOLIC PANEL
Anion gap: 20 — ABNORMAL HIGH (ref 5–15)
BUN: 47 mg/dL — ABNORMAL HIGH (ref 6–20)
CO2: 18 mmol/L — ABNORMAL LOW (ref 22–32)
Calcium: 8.7 mg/dL — ABNORMAL LOW (ref 8.9–10.3)
Chloride: 90 mmol/L — ABNORMAL LOW (ref 98–111)
Creatinine, Ser: 13.14 mg/dL — ABNORMAL HIGH (ref 0.61–1.24)
GFR, Estimated: 4 mL/min — ABNORMAL LOW (ref >60)
Glucose, Bld: 358 mg/dL — ABNORMAL HIGH (ref 70–99)
Potassium: 6.1 mmol/L — ABNORMAL HIGH (ref 3.5–5.1)
Sodium: 128 mmol/L — ABNORMAL LOW (ref 135–145)

## 2022-02-03 LAB — AMMONIA: Ammonia: 42 umol/L — ABNORMAL HIGH (ref 9–35)

## 2022-02-03 LAB — PROTIME-INR
INR: 1.3 — ABNORMAL HIGH (ref 0.8–1.2)
Prothrombin Time: 16.5 seconds — ABNORMAL HIGH (ref 11.4–15.2)

## 2022-02-03 LAB — MAGNESIUM
Magnesium: 1.6 mg/dL — ABNORMAL LOW (ref 1.7–2.4)
Magnesium: 1.6 mg/dL — ABNORMAL LOW (ref 1.7–2.4)

## 2022-02-03 LAB — TROPONIN I (HIGH SENSITIVITY): Troponin I (High Sensitivity): 505 ng/L (ref <18)

## 2022-02-03 LAB — LACTIC ACID, PLASMA
Lactic Acid, Venous: 2.6 mmol/L (ref 0.5–1.9)
Lactic Acid, Venous: 2.5 mmol/L (ref 0.5–1.9)

## 2022-02-03 MED ORDER — THIAMINE HCL 100 MG PO TABS
100.0000 mg | ORAL_TABLET | Freq: Every day | ORAL | Status: DC
  Administered 2022-02-04 – 2022-02-20 (×14): 100 mg via ORAL
  Filled 2022-02-03 (×16): qty 1

## 2022-02-03 MED ORDER — METRONIDAZOLE 500 MG PO TABS
500.0000 mg | ORAL_TABLET | Freq: Two times a day (BID) | ORAL | Status: DC
  Administered 2022-02-03 – 2022-02-20 (×32): 500 mg via ORAL
  Filled 2022-02-03 (×33): qty 1

## 2022-02-03 MED ORDER — METRONIDAZOLE 500 MG/100ML IV SOLN
500.0000 mg | Freq: Two times a day (BID) | INTRAVENOUS | Status: DC
  Administered 2022-02-03: 500 mg via INTRAVENOUS
  Filled 2022-02-03: qty 100

## 2022-02-03 MED ORDER — MUPIROCIN 2 % EX OINT
1.0000 "application " | TOPICAL_OINTMENT | Freq: Two times a day (BID) | CUTANEOUS | Status: DC
  Administered 2022-02-03 – 2022-02-07 (×8): 1 via NASAL
  Filled 2022-02-03 (×2): qty 22

## 2022-02-03 MED ORDER — MIDAZOLAM HCL 2 MG/2ML IJ SOLN: 2.0000 mg | Freq: Once | INTRAMUSCULAR | Status: DC

## 2022-02-03 MED ORDER — NEPRO/CARBSTEADY PO LIQD
237.0000 mL | Freq: Two times a day (BID) | ORAL | Status: DC
  Administered 2022-02-04 – 2022-02-20 (×11): 237 mL via ORAL

## 2022-02-03 MED ORDER — INSULIN GLARGINE-YFGN 100 UNIT/ML ~~LOC~~ SOLN
30.0000 [IU] | Freq: Every day | SUBCUTANEOUS | Status: DC
  Administered 2022-02-04: 30 [IU] via SUBCUTANEOUS
  Filled 2022-02-03 (×2): qty 0.3

## 2022-02-03 MED ORDER — NALOXONE HCL 2 MG/2ML IJ SOSY
2.0000 mg | PREFILLED_SYRINGE | Freq: Once | INTRAMUSCULAR | Status: AC
  Administered 2022-02-03: 2 mg via INTRAVENOUS
  Filled 2022-02-03: qty 2

## 2022-02-03 MED ORDER — DEXTROSE 5 % IV SOLN
1.0000 mg/h | INTRAVENOUS | Status: DC
  Administered 2022-02-03 (×2): 1 mg/h via INTRAVENOUS
  Filled 2022-02-03 (×2): qty 10

## 2022-02-03 MED ORDER — IOHEXOL 350 MG/ML SOLN
65.0000 mL | Freq: Once | INTRAVENOUS | Status: AC | PRN
  Administered 2022-02-03: 65 mL via INTRAVENOUS

## 2022-02-03 MED ORDER — THIAMINE HCL 100 MG/ML IJ SOLN
100.0000 mg | Freq: Every day | INTRAMUSCULAR | Status: DC
  Administered 2022-02-03 – 2022-02-07 (×2): 100 mg via INTRAVENOUS
  Filled 2022-02-03 (×4): qty 2

## 2022-02-03 MED ORDER — PROSOURCE PLUS PO LIQD
30.0000 mL | Freq: Two times a day (BID) | ORAL | Status: DC
  Administered 2022-02-03 – 2022-02-20 (×16): 30 mL via ORAL
  Filled 2022-02-03 (×24): qty 30

## 2022-02-03 MED ORDER — NALOXONE HCL 0.4 MG/ML IJ SOLN
INTRAMUSCULAR | Status: AC
Start: 2022-02-03 — End: 2022-02-03
  Filled 2022-02-03: qty 1

## 2022-02-03 MED ORDER — NALOXONE HCL 0.4 MG/ML IJ SOLN
INTRAMUSCULAR | Status: AC
Start: 2022-02-03 — End: 2022-02-03
  Administered 2022-02-03: 0.4 mg
  Filled 2022-02-03: qty 1

## 2022-02-03 MED ORDER — PANTOPRAZOLE SODIUM 40 MG IV SOLR
40.0000 mg | Freq: Every day | INTRAVENOUS | Status: DC
  Administered 2022-02-03: 40 mg via INTRAVENOUS
  Filled 2022-02-03: qty 10

## 2022-02-03 MED ORDER — INSULIN GLARGINE-YFGN 100 UNIT/ML ~~LOC~~ SOLN
25.0000 [IU] | Freq: Every day | SUBCUTANEOUS | Status: DC
  Administered 2022-02-03: 25 [IU] via SUBCUTANEOUS
  Filled 2022-02-03: qty 0.25

## 2022-02-03 MED ORDER — GLUCERNA SHAKE PO LIQD: 237.0000 mL | Freq: Three times a day (TID) | ORAL | Status: DC

## 2022-02-03 MED ORDER — NALOXONE HCL 0.4 MG/ML IJ SOLN
0.4000 mg | INTRAMUSCULAR | Status: DC | PRN
  Administered 2022-02-03: 0.4 mg via INTRAVENOUS

## 2022-02-03 MED ORDER — CHLORHEXIDINE GLUCONATE CLOTH 2 % EX PADS
6.0000 | MEDICATED_PAD | Freq: Every day | CUTANEOUS | Status: DC
  Administered 2022-02-03 – 2022-02-04 (×2): 6 via TOPICAL

## 2022-02-03 MED ORDER — ADULT MULTIVITAMIN W/MINERALS CH: 1.0000 | ORAL_TABLET | Freq: Every day | ORAL | Status: DC

## 2022-02-03 MED ORDER — MAGNESIUM SULFATE 2 GM/50ML IV SOLN
2.0000 g | Freq: Once | INTRAVENOUS | Status: AC
  Administered 2022-02-03: 2 g via INTRAVENOUS
  Filled 2022-02-03: qty 50

## 2022-02-03 MED ORDER — ASCORBIC ACID 500 MG PO TABS
500.0000 mg | ORAL_TABLET | Freq: Every day | ORAL | Status: DC
  Administered 2022-02-03 – 2022-02-20 (×15): 500 mg via ORAL
  Filled 2022-02-03 (×15): qty 1

## 2022-02-03 MED ORDER — MIDAZOLAM HCL 2 MG/2ML IJ SOLN
INTRAMUSCULAR | Status: AC
  Filled 2022-02-03: qty 2

## 2022-02-03 MED ORDER — DOCUSATE SODIUM 100 MG PO CAPS
100.0000 mg | ORAL_CAPSULE | Freq: Two times a day (BID) | ORAL | Status: DC | PRN
  Administered 2022-02-05 – 2022-02-09 (×3): 100 mg via ORAL
  Filled 2022-02-03 (×4): qty 1

## 2022-02-03 MED ORDER — RENA-VITE PO TABS
1.0000 | ORAL_TABLET | Freq: Every day | ORAL | Status: DC
  Administered 2022-02-04 – 2022-02-19 (×16): 1 via ORAL
  Filled 2022-02-03 (×17): qty 1

## 2022-02-03 MED ORDER — INSULIN ASPART 100 UNIT/ML IJ SOLN
0.0000 [IU] | INTRAMUSCULAR | Status: DC
  Administered 2022-02-03 (×2): 20 [IU] via SUBCUTANEOUS
  Administered 2022-02-03: 4 [IU] via SUBCUTANEOUS
  Administered 2022-02-03: 7 [IU] via SUBCUTANEOUS
  Administered 2022-02-03: 20 [IU] via SUBCUTANEOUS
  Administered 2022-02-04: 3 [IU] via SUBCUTANEOUS
  Administered 2022-02-04: 7 [IU] via SUBCUTANEOUS
  Administered 2022-02-04 (×3): 4 [IU] via SUBCUTANEOUS
  Administered 2022-02-04: 7 [IU] via SUBCUTANEOUS
  Administered 2022-02-05: 4 [IU] via SUBCUTANEOUS
  Administered 2022-02-05 – 2022-02-06 (×2): 3 [IU] via SUBCUTANEOUS
  Administered 2022-02-06: 4 [IU] via SUBCUTANEOUS
  Administered 2022-02-06: 3 [IU] via SUBCUTANEOUS
  Administered 2022-02-06: 4 [IU] via SUBCUTANEOUS
  Administered 2022-02-07 (×3): 3 [IU] via SUBCUTANEOUS
  Administered 2022-02-08: 11 [IU] via SUBCUTANEOUS
  Administered 2022-02-08 (×2): 4 [IU] via SUBCUTANEOUS
  Administered 2022-02-09: 7 [IU] via SUBCUTANEOUS

## 2022-02-03 MED ORDER — PANTOPRAZOLE SODIUM 40 MG PO TBEC
40.0000 mg | DELAYED_RELEASE_TABLET | Freq: Every day | ORAL | Status: DC
Start: 2022-02-04 — End: 2022-02-20
  Administered 2022-02-04 – 2022-02-20 (×13): 40 mg via ORAL
  Filled 2022-02-03 (×14): qty 1

## 2022-02-03 MED ORDER — SODIUM CHLORIDE 0.9 % IV BOLUS
500.0000 mL | Freq: Once | INTRAVENOUS | Status: AC
  Administered 2022-02-03: 500 mL via INTRAVENOUS

## 2022-02-03 MED ORDER — ZINC SULFATE 220 (50 ZN) MG PO CAPS
220.0000 mg | ORAL_CAPSULE | Freq: Two times a day (BID) | ORAL | Status: AC
  Administered 2022-02-03 – 2022-02-16 (×25): 220 mg via ORAL
  Filled 2022-02-03 (×27): qty 1

## 2022-02-03 NOTE — Progress Notes (Signed)
PROGRESS NOTE  [name] Lance Sr. UJW:[id number] DOB: 22-Jun-1961 DOA: 02/02/2022 PCP: [facility]   LOS: 1 day   Brief Narrative / Interim history: 61 y/o male history of DM, ESRD on HD, admitted to the hospital with fever, acute encephalopathy, sepsis and right foot infection.  He has a history of prior right fifth metatarsal amputation performed many years ago in [place].  Plain films of his foot indicate swelling and subcutaneous gas without any obvious bony erosion. [facility] presented to [facility] and subsequently transferred to [facility].  Podiatry consulted, he was taken emergently to the OR on 3/19  Subjective / 24h Interval events: Still a little bit sleepy but more alert than yesterday  Assesement and Plan: Principal Problem:   Osteomyelitis (HCC) Active Problems:   ESRD (end stage renal disease) (HCC)   DM2 (diabetes mellitus, type 2) (HCC)   HTN (hypertension)   Chronic systolic CHF (congestive heart failure) (HCC)   Acute metabolic encephalopathy   Severe sepsis (HCC)   Acute respiratory failure with hypoxia (HCC)   Protein-calorie malnutrition, mild (HCC)   GERD (gastroesophageal reflux disease)   Hyponatremia   Hyperkalemia   High anion gap metabolic acidosis   Anemia of chronic renal failure   Assessment and Plan: Principal problem Severe sepsis due to right foot osteomyelitis / cellulitis -patient met criteria for sepsis with elevated lactic acid, confusion, high fever, tachycardia, elevated WBC and a source.  He was placed on broad-spectrum antibiotics with vancomycin, cefepime, Flagyl, continue.  Podiatry consulted and he was taken to the OR on 3/19 and he is status post right foot I&D and revision of the fifth ray amputation.  Intraoperative cultures are pending.  Vascular surgery evaluated patient in the past with no vascular intervention is warranted.  Active problems Acute metabolic encephalopathy-likely in the setting of severe  sepsis, high fever.  He is starting to come around this morning and is a little bit more alert.  Nephrology to dialyze him, hopefully he will get even better.  Critical care consulted last night due to patient's lethargy, he was placed on Narcan drip however patient only received 5 mg of oxycodone and no other pain medications.  No need for further Narcan, discontinued this morning.  If he wakes up more in the afternoon will transfer out of the ICU  ESRD-nephrology consulted, dialysis today  Acute hypoxic respiratory failure-possibly due to #1 and acute on chronic CHF, chest x-ray on admission with mild cardiomegaly, try to wean off to room air after dialysis  Acute on chronic systolic CHF-most recent echo July 2022 shows an EF of 45%.  Volume management per dialysis  GERD (gastroesophageal reflux disease) -Continue Protonix  Hyponatremia-due to fluid overload, dialysis today  Hyperkalemia-potassium to be managed with dialysis today  Anion gap metabolic acidosis-in the setting of sepsis/ESRD  Anemia of chronic renal disease -no bleeding, monitor hemoglobin  DM2 (diabetes mellitus, type 2), poorly controlled, with hyperglycemia-Patient takes [place] and NovoLog at home.  Continue every 4 sliding scale until he eats more, he is on glargine, increase to 30 today due to persistent hyperglycemia  CBG (last 3)  Recent Labs    02/03/22 0414 02/03/22 0723 02/03/22 1147  GLUCAP 369* 391* 366*    Scheduled Meds:  (feeding supplement) PROSource Plus  30 mL Oral BID BM   vitamin C  500 mg Oral Daily   Chlorhexidine Gluconate Cloth  6 each Topical Q0600   Chlorhexidine Gluconate Cloth  6 each  Topical Q0600   famotidine  20 mg Oral Daily   feeding supplement (NEPRO CARB STEADY)  237 mL Oral BID BM   heparin  5,000 Units Subcutaneous Q8H   insulin aspart  0-20 Units Subcutaneous Q4H   [START ON 02/04/2022] insulin glargine-yfgn  30 Units Subcutaneous Daily   metroNIDAZOLE  500 mg Oral Q12H    midazolam       multivitamin  1 tablet Oral QHS   mupirocin ointment  1 application. Nasal BID   [START ON 02/04/2022] pantoprazole  40 mg Oral Q1200   sucroferric oxyhydroxide  1,000 mg Oral TID WC   thiamine injection  100 mg Intravenous Daily   Or   thiamine  100 mg Oral Daily   zinc sulfate  220 mg Oral BID   Continuous Infusions:  sodium chloride     sodium chloride     ceFEPime (MAXIPIME) IV     lactated ringers     sodium chloride     vancomycin     PRN Meds:.sodium chloride, sodium chloride, acetaminophen **OR** acetaminophen, albuterol, alteplase, docusate sodium, heparin, lidocaine (PF), lidocaine-prilocaine, ondansetron **OR** ondansetron (ZOFRAN) IV, pentafluoroprop-tetrafluoroeth, polyethylene glycol, sodium chloride  Diet Orders (From admission, onward)     Start     Ordered   02/03/22 1102  Diet Carb Modified Fluid consistency: Thin; Room service appropriate? Yes  Diet effective now       Question Answer Comment  Diet-HS Snack? Nothing   Calorie Level Medium 1600-2000   Fluid consistency: Thin   Room service appropriate? Yes      02/03/22 1101            DVT prophylaxis: heparin injection 5,000 Units Start: 02/02/22 0730 SCDs Start: 02/02/22 0716   Lab Results  Component Value Date   PLT 150 02/03/2022      Code Status: Full Code  Family Communication: Daughter present at bedside  Status is: Inpatient  Remains inpatient appropriate because: Severity of illness, confusion   Level of care: ICU  Consultants:  PCCM Nephrology Podiatry Infectious disease  Procedures:  I&D right foot 3/19  Microbiology  Surgery cultures-Morganella Morgagni, Enterococcus faecalis, pending  Antimicrobials: Vancomycin, cefepime, metronidazole 3/19 >>   Objective: Vitals:   02/03/22 1100 02/03/22 1200 02/03/22 1300 02/03/22 1310  BP: 131/71 108/73 95/64 105/64  Pulse: 71 70 67 64  Resp: 16 (!) 23 (!) 30 18  Temp:  97.9 F (36.6 C) 97.7 F (36.5 C)  97.7 F (36.5 C)  TempSrc:  Bladder Axillary Axillary  SpO2: 94% 98% 96% 96%  Weight:   121.9 kg   Height:        Intake/Output Summary (Last 24 hours) at 02/03/2022 1325 Last data filed at 02/03/2022 1100 Gross per 24 hour  Intake 766.75 ml  Output --  Net 766.75 ml   Wt Readings from Last 3 Encounters:  02/03/22 121.9 kg  01/29/22 121.1 kg  12/06/21 120 kg    Examination:  Constitutional: NAD Eyes: no scleral icterus ENMT: Mucous membranes are moist.  Neck: normal, supple Respiratory: clear to auscultation bilaterally, no wheezing, no crackles. Normal respiratory effort.  Cardiovascular: Regular rate and rhythm, no murmurs / rubs / gallops. No LE edema.  Abdomen: non distended, no tenderness. Bowel sounds positive.  Musculoskeletal: no clubbing / cyanosis.  Skin: no rashes Neurologic: non focal    Data Reviewed: I have independently reviewed following labs and imaging studies   CBC Recent Labs  Lab 02/02/22 0107 02/02/22 0920 02/03/22 0037  02/03/22 0408  WBC 12.0* 12.4* 13.6* 11.6*  HGB 12.0* 11.8* 12.3* 11.6*  HCT 37.1* 37.0* 38.8* 36.2*  PLT 171 155 160 150  MCV 95.9 96.1 95.3 94.3  MCH 31.0 30.6 30.2 30.2  MCHC 32.3 31.9 31.7 32.0  RDW 15.9* 16.1* 16.0* 16.2*  LYMPHSABS 0.9  --   --   --   MONOABS 1.9*  --   --   --   EOSABS 0.0  --   --   --   BASOSABS 0.1  --   --   --     Recent Labs  Lab 02/02/22 0107 02/02/22 0329 02/02/22 0604 02/02/22 0604 02/02/22 1618 02/03/22 0037 02/03/22 0408  NA 129*  --  131*  --   --  131*  130* 128*  K 3.5  --  3.6  --   --  5.5*  5.3* 6.1*  CL 87*  --  90*  --   --  92*  91* 90*  CO2 29  --  26  --   --  23  23 18*  GLUCOSE 479*  --  376*  --   --  278*  282* 358*  BUN 42*  --  43*  --   --  47*  48* 47*  CREATININE 11.09*  --  11.09*  --   --  13.10*  13.19* 13.14*  CALCIUM 9.0  --  8.7*  --   --  9.0  8.9 8.7*  AST 25  --  29  --   --  27  27  --   ALT 18  --  18  --   --  17  19  --    ALKPHOS 76  --  71  --   --  68  69  --   BILITOT 0.6  --  0.8  --   --  0.8  0.9  --   ALBUMIN 3.0*  --  2.9*  --   --  2.6*  2.7*  --   MG  --   --  1.7  --   --  1.6* 1.6*  CRP  --   --  29.8*  --  33.6*  --   --   LATICACIDVEN 3.1* 2.2* 2.5*  --   --  2.6* 2.5*  INR 1.3*  --   --   --   --  1.3*  --   HGBA1C  --   --  11.9*   < > 12.2*  --   --   AMMONIA  --   --   --   --   --   --  42*   < > = values in this interval not displayed.    ------------------------------------------------------------------------------------------------------------------ No results for input(s): CHOL, HDL, LDLCALC, TRIG, CHOLHDL, LDLDIRECT in the last 72 hours.  Lab Results  Component Value Date   HGBA1C 12.2 (H) 02/02/2022   ------------------------------------------------------------------------------------------------------------------ No results for input(s): TSH, T4TOTAL, T3FREE, THYROIDAB in the last 72 hours.  Invalid input(s): FREET3  Cardiac Enzymes No results for input(s): CKMB, TROPONINI, MYOGLOBIN in the last 168 hours.  Invalid input(s): CK ------------------------------------------------------------------------------------------------------------------ No results found for: BNP  CBG: Recent Labs  Lab 02/02/22 2356 02/03/22 0224 02/03/22 0414 02/03/22 0723 02/03/22 1147  GLUCAP 238* 310* 369* 391* 366*    Recent Results (from the past 240 hour(s))  Blood Culture (routine x 2)     Status: None (Preliminary result)   Collection Time: 02/02/22  1:50 AM   Specimen: BLOOD RIGHT ARM  Result Value Ref Range Status   Specimen Description BLOOD RIGHT ARM  Final   Special Requests   Final    BOTTLES DRAWN AEROBIC AND ANAEROBIC Blood Culture adequate volume   Culture   Final    NO GROWTH < 12 HOURS Performed at [facility], [address], [place], [place] [address]    Report Status PENDING  Incomplete  Blood Culture (routine x 2)     Status: None (Preliminary result)    Collection Time: 02/02/22  1:52 AM   Specimen: BLOOD RIGHT HAND  Result Value Ref Range Status   Specimen Description BLOOD RIGHT HAND  Final   Special Requests   Final    BOTTLES DRAWN AEROBIC ONLY Blood Culture adequate volume   Culture   Final    NO GROWTH < 12 HOURS Performed at [facility], [address], [place], [place] [address]    Report Status PENDING  Incomplete  Resp Panel by RT-PCR (Flu A&B, Covid) Nasopharyngeal Swab     Status: None   Collection Time: 02/02/22  7:59 AM   Specimen: Nasopharyngeal Swab; Nasopharyngeal(NP) swabs in vial transport medium  Result Value Ref Range Status   SARS Coronavirus 2 by RT PCR NEGATIVE NEGATIVE Final    Comment: (NOTE) SARS-CoV-2 target nucleic acids are NOT DETECTED.  The SARS-CoV-2 RNA is generally detectable in upper respiratory specimens during the acute phase of infection. The lowest concentration of SARS-CoV-2 viral copies this assay can detect is 138 copies/mL. A negative result does not preclude SARS-Cov-2 infection and should not be used as the sole basis for treatment or other patient management decisions. A negative result may occur with  improper specimen collection/handling, submission of specimen other than nasopharyngeal swab, presence of viral mutation(s) within the areas targeted by this assay, and inadequate number of viral copies(<138 copies/mL). A negative result must be combined with clinical observations, patient history, and epidemiological information. The expected result is Negative.  Fact Sheet for Patients:  BloggerCourse.com  Fact Sheet for Healthcare Providers:  SeriousBroker.it  This test is no t yet approved or cleared by the [place] FDA and  has been authorized for detection and/or diagnosis of SARS-CoV-2 by FDA under an Emergency Use Authorization (EUA). This EUA will remain  in effect (meaning this test can be used) for the duration of  the COVID-19 declaration under Section 564(b)(1) of the Act, 21 U.S.C.section 360bbb-3(b)(1), unless the authorization is terminated  or revoked sooner.       Influenza A by PCR NEGATIVE NEGATIVE Final   Influenza B by PCR NEGATIVE NEGATIVE Final    Comment: (NOTE) The Xpert Xpress SARS-CoV-2/FLU/RSV plus assay is intended as an aid in the diagnosis of influenza from Nasopharyngeal swab specimens and should not be used as a sole basis for treatment. Nasal washings and aspirates are unacceptable for Xpert Xpress SARS-CoV-2/FLU/RSV testing.  Fact Sheet for Patients: BloggerCourse.com  Fact Sheet for Healthcare Providers: SeriousBroker.it  This test is not yet approved or cleared by the [place] FDA and has been authorized for detection and/or diagnosis of SARS-CoV-2 by FDA under an Emergency Use Authorization (EUA). This EUA will remain in effect (meaning this test can be used) for the duration of the COVID-19 declaration under Section 564(b)(1) of the Act, 21 U.S.C. section 360bbb-3(b)(1), unless the authorization is terminated or revoked.  Performed at [facility], [address], [place], [place] [address]   Surgical pcr screen  Status: Abnormal   Collection Time: 02/02/22 11:25 AM   Specimen: Nasal Mucosa; Nasal Swab  Result Value Ref Range Status   MRSA, PCR NEGATIVE NEGATIVE Final   Staphylococcus aureus POSITIVE (A) NEGATIVE Final    Comment: (NOTE) The Xpert SA Assay (FDA approved for NASAL specimens in patients 62 years of age and older), is one component of a comprehensive surveillance program. It is not intended to diagnose infection nor to guide or monitor treatment. Performed at [facility], [address], [place], [place] [address]   Aerobic/Anaerobic Culture w Gram Stain (surgical/deep wound)     Status: None (Preliminary result)   Collection Time: 02/02/22 12:30 PM   Specimen: Foot, Right;  Tissue  Result Value Ref Range Status   Specimen Description TISSUE  Final   Special Requests RESIDUAL FIFTH METATARSAL AMPUTATION SPEC B  Final   Gram Stain NO WBC SEEN RARE GRAM NEGATIVE RODS   Final   Culture   Final    RARE MORGANELLA MORGANII CULTURE REINCUBATED FOR BETTER GROWTH Performed at [facility], [address] [place], [place] [address]    Report Status PENDING  Incomplete  Aerobic/Anaerobic Culture w Gram Stain (surgical/deep wound)     Status: None (Preliminary result)   Collection Time: 02/02/22 12:37 PM   Specimen: Wound  Result Value Ref Range Status   Specimen Description WOUND  Final   Special Requests RIGHT FOOT WOUND SPEC A  Final   Gram Stain   Final    RARE WBC PRESENT,BOTH PMN AND MONONUCLEAR ABUNDANT GRAM VARIABLE ROD ABUNDANT GRAM POSITIVE COCCI    Culture   Final    MODERATE MORGANELLA MORGANII SUSCEPTIBILITIES TO FOLLOW FEW ENTEROCOCCUS FAECALIS CULTURE REINCUBATED FOR BETTER GROWTH Performed at [facility], [address], [place], [place] [address]    Report Status PENDING  Incomplete  MRSA Next Gen by PCR, Nasal     Status: None   Collection Time: 02/03/22  2:18 AM   Specimen: Nasal Mucosa; Nasal Swab  Result Value Ref Range Status   MRSA by PCR Next Gen NOT DETECTED NOT DETECTED Final    Comment: (NOTE) The GeneXpert MRSA Assay (FDA approved for NASAL specimens only), is one component of a comprehensive MRSA colonization surveillance program. It is not intended to diagnose MRSA infection nor to guide or monitor treatment for MRSA infections. Test performance is not FDA approved in patients less than 49 years old. Performed at [facility], [address] [place], [place] [address]      Radiology Studies: CT ANGIO HEAD NECK W WO CM  Result Date: 02/03/2022 CLINICAL DATA:  Acute neurologic deficit EXAM: CT ANGIOGRAPHY HEAD AND NECK TECHNIQUE: Multidetector CT imaging of the head and neck was performed using the  standard protocol during bolus administration of intravenous contrast. Multiplanar CT image reconstructions and MIPs were obtained to evaluate the vascular anatomy. Carotid stenosis measurements (when applicable) are obtained utilizing NASCET criteria, using the distal internal carotid diameter as the denominator. RADIATION DOSE REDUCTION: This exam was performed according to the departmental dose-optimization program which includes automated exposure control, adjustment of the mA and/or kV according to patient size and/or use of iterative reconstruction technique. CONTRAST:  65mL OMNIPAQUE IOHEXOL 350 MG/ML SOLN COMPARISON:  None. FINDINGS: CT HEAD FINDINGS Brain: There is no mass, hemorrhage or extra-axial collection. The size and configuration of the ventricles and extra-axial CSF spaces are normal. There is no acute or chronic infarction. The brain parenchyma  is normal. Skull: The visualized skull base, calvarium and extracranial soft tissues are normal. Sinuses/Orbits: No fluid levels or advanced mucosal thickening of the visualized paranasal sinuses. No mastoid or middle ear effusion. The orbits are normal. CTA NECK FINDINGS SKELETON: There is no bony spinal canal stenosis. No lytic or blastic lesion. OTHER NECK: Normal pharynx, larynx and major salivary glands. No cervical lymphadenopathy. Unremarkable thyroid gland. UPPER CHEST: No pneumothorax or pleural effusion. No nodules or masses. AORTIC ARCH: There is calcific atherosclerosis of the aortic arch. There is no aneurysm, dissection or hemodynamically significant stenosis of the visualized portion of the aorta. Conventional 3 vessel aortic branching pattern. The visualized proximal subclavian arteries are widely patent. RIGHT CAROTID SYSTEM: No dissection, occlusion or aneurysm. There is mixed density atherosclerosis extending into the proximal ICA, resulting in 70% stenosis. LEFT CAROTID SYSTEM: No dissection, occlusion or aneurysm. Mild atherosclerotic  calcification at the carotid bifurcation without hemodynamically significant stenosis. VERTEBRAL ARTERIES: Codominant configuration. Both origins are clearly patent. There is no dissection, occlusion or flow-limiting stenosis to the skull base (V1-V3 segments). CTA HEAD FINDINGS POSTERIOR CIRCULATION: --Vertebral arteries: Normal V4 segments. --Inferior cerebellar arteries: Normal. --Basilar artery: Normal. --Superior cerebellar arteries: Normal. --Posterior cerebral arteries (PCA): Normal. ANTERIOR CIRCULATION: --Intracranial internal carotid arteries: Atherosclerotic calcification of the internal carotid arteries at the skull base without hemodynamically significant stenosis. --Anterior cerebral arteries (ACA): Normal. Both A1 segments are present. Patent anterior communicating artery (a-comm). --Middle cerebral arteries (MCA): Normal. ANATOMIC VARIANTS: None Review of the MIP images confirms the above findings. IMPRESSION: 1. No emergent large vessel occlusion or hemodynamically significant stenosis of the intracranial arteries. 2. 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 Aortic atherosclerosis (ICD10-I70.0). Electronically Signed   By: Deatra Robinson M.D.   On: 02/03/2022 02:14   DG CHEST PORT 1 VIEW  Result Date: 02/03/2022 CLINICAL DATA:  Respiratory distress EXAM: PORTABLE CHEST 1 VIEW COMPARISON:  02/02/2022 FINDINGS: Cardiomegaly. No frank interstitial edema. No pleural effusion or pneumothorax. IMPRESSION: Cardiomegaly.  No frank interstitial edema. Electronically Signed   By: Charline Bills M.D.   On: 02/03/2022 00:46   DG Foot Complete Right  Result Date: 02/02/2022 CLINICAL DATA:  Postop infection. EXAM: RIGHT FOOT COMPLETE - 3+ VIEW COMPARISON:  Radiograph earlier today. FINDINGS: A dressing overlies the lateral aspect of the foot, including previous area of soft tissue gas. This obscures assessment of the underlying insight 2 fifth metatarsal, with  prior fifth metatarsal transmetatarsal amputation. Chronic deformity of the right great toe. No new osseous abnormalities from earlier today. Advanced vascular calcifications. IMPRESSION: Overlying dressing obscures the area of previous soft tissue gas in the lateral foot, as well as assessment of the underlying fifth metatarsal. Prior fifth transmetatarsal amputation. No new osseous abnormalities from earlier today. Electronically Signed   By: Narda Rutherford M.D.   On: 02/02/2022 15:51   CT VENOGRAM HEAD  Result Date: 02/03/2022 CLINICAL DATA:  Acute neurologic deficit EXAM: CT VENOGRAM HEAD TECHNIQUE: Venographic phase images of the brain were obtained following the administration of intravenous contrast. Multiplanar reformats and maximum intensity projections were generated. RADIATION DOSE REDUCTION: This exam was performed according to the departmental dose-optimization program which includes automated exposure control, adjustment of the mA and/or kV according to patient size and/or use of iterative reconstruction technique. CONTRAST:  65mL OMNIPAQUE IOHEXOL 350 MG/ML SOLN COMPARISON:  None. FINDINGS: Superior sagittal sinus: Normal. Straight sinus: Normal. Inferior sagittal sinus, vein of Galen and internal cerebral veins: Normal. Transverse sinuses: Normal.  Sigmoid sinuses: Normal. Visualized jugular veins: Normal. IMPRESSION: Normal CT venogram. Electronically Signed   By: Deatra Robinson M.D.   On: 02/03/2022 02:48     Pamella Pert, MD, PhD [facility]  Between 7 am - 7 pm I am available, please contact me via Amion (for emergencies) or Securechat (non urgent messages)  Between 7 pm - 7 am I am not available, please contact night coverage MD/APP via Amion

## 2022-02-03 NOTE — Progress Notes (Signed)
Initial Nutrition Assessment ? ?DOCUMENTATION CODES:  ? ?Not applicable ? ?INTERVENTION:  ? ?- Nepro Shake po BID between meals, each supplement provides 425 kcal and 19 grams protein ? ?- ProSource Plus po BID, each supplement provides 100 kcal and 15 grams of protein ? ?- Renal MVI daily ? ?- Zinc sulfate 220 mg BID x 14 days and vitamin C 500 mg daily to aid in wound healing ? ?NUTRITION DIAGNOSIS:  ? ?Increased nutrient needs related to wound healing, chronic illness (CHF, ESRD) as evidenced by estimated needs. ? ?GOAL:  ? ?Patient will meet greater than or equal to 90% of their needs ? ?MONITOR:  ? ?PO intake, Supplement acceptance, Labs, Weight trends, Skin, I & O's ? ?REASON FOR ASSESSMENT:  ? ?Consult ?Wound healing ? ?ASSESSMENT:  ? ?61 year old male who presented to the ED on 3/19 with AMS. PMH of ESRD on HD, T2DM, HTN, CHF, myocardial infarction, nonischemic cardiomyopathy. Pt admitted with sepsis secondary to osteomyelitis. ? ?03/19 - s/p I&D R foot and revision of fifth ray amputation ? ?Discussed pt with RN and during ICU rounds. Diet advanced this morning to Carb Modified. Pt consumed 75% of breakfast meal per RN. Pt due for dialysis today. ? ?Spoke with pt and daughter at bedside. Pt lethargic but able to answer most questions appropriately. Daughter reports feeing pt breakfast this morning and confirms that he ate well. ? ?Pt and daughter both report that pt usually has an excellent appetite and eats at least 3 meals daily. Pt unable to state exactly what he eats but daughter reports pt eats a wide variety of foods and "loves to eat." Pt reports that he misses lunch on HD days but "makes up for it later." Pt and daughter deny pt experiencing any recent weight changes. Reviewed weight history in chart. Weight appears fairly stable over the last 2.5 years with some fluctuations up to 121 kg. Suspect higher weights are related to volume overload/fluid status given pt with ESRD on HD and CHF. ? ?Pt  reports that he ambulates without assistive devices. He denies N/V or other GI symptoms. ? ?RD to order zinc sulfate and vitamin C to aid in wound healing. Will also order daily renal MVI. Pt amenable to oral nutrition supplements. Will order Nepro shakes and ProSource Plus to increase kcal and protein and promote wound healing. Pt reports that he does take his phosphorus binders with meals at home. ? ?Educated pt and daughter on the importance of adequate protein intake to promote wound healing. Discussed high-protein food options with pt that he enjoys. Encouraged pt to consume high-protein foods with each meal and snack. ? ?EDW: 117.8 kg ?Admit weight: 121.1 kg ?Current weight: 117.9 kg ? ?Medications reviewed and include: pepcid, SSI q 4 hours, semglee 25 units daily, protonix, velphoro TID with meals, IV thiamine, IV abx, IV magnesium sulfate 2 grams once ? ?Labs reviewed: sodium 128, potassium 6.1, magnesium 1.6, lactic acid 2.5, WBC 11.6, hemoglobin A1C 12.2 ?CBG's: 173-391 x 24 hours ? ?I/O's: +2.5 L since admit ? ?NUTRITION - FOCUSED PHYSICAL EXAM: ? ?Flowsheet Row Most Recent Value  ?Orbital Region No depletion  ?Upper Arm Region No depletion  ?Thoracic and Lumbar Region No depletion  ?Buccal Region No depletion  ?Temple Region No depletion  ?Clavicle Bone Region No depletion  ?Clavicle and Acromion Bone Region No depletion  ?Scapular Bone Region No depletion  ?Dorsal Hand No depletion  ?Patellar Region Mild depletion  ?Anterior Thigh Region Mild depletion  ?Posterior Calf  Region Mild depletion  ?Edema (RD Assessment) None  ?Hair Reviewed  ?Eyes Reviewed  ?Mouth Reviewed  ?Skin Reviewed  ?Nails Reviewed  ? ?  ? ? ?Diet Order:   ?Diet Order   ? ?       ?  Diet Carb Modified Fluid consistency: Thin; Room service appropriate? Yes  Diet effective now       ?  ? ?  ?  ? ?  ? ? ?EDUCATION NEEDS:  ? ?Education needs have been addressed ? ?Skin:  Skin Assessment: ?Skin Integrity Issues: ?Diabetic Ulcer: R  foot ? ?Last BM:  02/01/22 ? ?Height:  ? ?Ht Readings from Last 1 Encounters:  ?02/02/22 6\' 1"  (1.854 m)  ? ? ?Weight:  ? ?Wt Readings from Last 1 Encounters:  ?02/03/22 117.9 kg  ? ? ?BMI:  Body mass index is 34.29 kg/m?. ? ?Estimated Nutritional Needs:  ? ?Kcal:  2200-2400 ? ?Protein:  115-130 grams ? ?Fluid:  1000 ml + UOP ? ? ? ?Gustavus Bryant, MS, RD, LDN ?Inpatient Clinical Dietitian ?Please see AMiON for contact information. ? ?

## 2022-02-03 NOTE — Progress Notes (Signed)
Overnight event ? ?Patient is a 61 year old male with a past medical history of ESRD on HD, chronic systolic CHF, type 2 diabetes.  Admitted to [facility] on 3/19 for fever, acute encephalopathy, and sepsis secondary to right foot infection/osteomyelitis.  He was started on vancomycin, cefepime, and Flagyl.  CT head done at the time of admission did not show any acute findings.  Chest x-ray showing cardiomegaly and central vascular congestion.  He was hypotensive on admission with blood pressure in the 90s and not on any antihypertensives at home.  Last dialysis was Friday 3/17. ? ?Went to bedside to evaluate the patient tonight as RN reported decreased level of consciousness and hypotension with systolic in the 16X.  Patient with his eyes closed and responsive only to painful stimuli.  Having periods of apnea.  Notified by RN that patient had a fever on arrival and was given Tylenol.  He was also given oxycodone for pain at that time.  At this time, not febrile or tachycardic.  He was placed on 5 L supplemental oxygen prior to my arrival and satting 100%. Diminished breath sounds on auscultation of the lungs but no wheezing or rales appreciated. ? ?-Patient was given a dose of Narcan without significant improvement in his mental status ?-He was given a 250 cc fluid bolus after which his systolic improved to the 09U but no change in mentation ?-Blood glucose checked and not hypoglycemic ?-ABG showing pH 7.4, PCO2 37, PO2 131 ?-Chest x-ray repeated and showing cardiomegaly without frank interstitial edema ?-EKG showing sinus rhythm, no acute ischemic changes. ?-Stat labs ordered including CBC, CMP, lactate, and troponin.  Currently pending. ?-Stat CT head ordered and currently pending ?-PCCM consulted ?-Neurology consulted and added on additional studies including CTA head and neck, CT venogram head, and ammonia level. ?

## 2022-02-03 NOTE — Consult Note (Signed)
? ?NAME:  [name] Hawkes., MRN:  [id number], DOB:  March 02, 1961, LOS: 1 ?ADMISSION DATE:  02/02/2022, CONSULTATION DATE: February 03, 2022 ?REFERRING MD: Hospitalist physician, CHIEF COMPLAINT: Drowsiness ? ?History of Present Illness:  ? ?Patient is 61 year old African-American male past medical history of end-stage renal disease hemodialysis chronic systolic heart failure type 2 diabetes admitted to [facility] 2 days ago with acute encephalopathy secondary to infection osteomyelitis in the right foot.  He was taken to the OR with debridement he was started on vancomycin cefepime Flagyl and last night he started to have low blood pressure on the altered mental status minimal response to Narcan.  We came up to by the bedside start him on a bolus for marginal low blood pressure of 500 cc after he received 250 cc with some improvement.  I gave him 2 mg of Narcan IV with more improvement than the previous dose neuro ordered CT scan head so patient will be going to CT scan head because of altered mental status and then to the ICU for Narcan drip.  He is on multiple doses of OxyContin extended release and morphine IV ?Objective   ?Blood pressure 101/72, pulse 67, temperature 97.8 ?F (36.6 ?C), temperature source Axillary, resp. rate 19, height 6\' 1"  (1.854 m), weight 117.9 kg, SpO2 99 %. ?   ?HPI ?Patient is more awake and alert this morning.  He is able to open his eyes follow commands and say his names.  He is still drowsy and slow to react. ? ?Intake/Output Summary (Last 24 hours) at 02/03/2022 0732 ?Last data filed at 02/02/2022 2127 ?Gross per 24 hour  ?Intake 340 ml  ?Output 5 ml  ?Net 335 ml  ? ? ?Filed Weights  ? 02/02/22 0117 02/02/22 1030 02/03/22 0500  ?Weight: 121.1 kg 119.6 kg 117.9 kg  ? ? ?Examination: ?General: More awake and alert ?Neuro: Awake, alert.  Able to open eyes and say his name.  Able to follow commands.   ?HEENT:  atraumatic , no jaundice , dry mucous membranes  ?Cardiovascular: Regular  rate and rhythm. ?Lungs:  CTA bilateral , no wheezing or crackles  ?Abdomen: Soft, nontender, bowel sound presents ?Musculoskeletal:  WNL , normal pulses  ?Skin:  No rash   ? ?Assessment & Plan:  ?Acute toxic metabolic encephalopathy  ?Multifactorial 2/2 systemic infection and opioid overdose in the setting of ESRD ?CT head was negative for any acute finding ?He is off of Narcan drip.  ?Will need HD today to dialyze the opioids. ?Hold off on any opioids for pain.  If have to use opioid, prefer Dilaudid. ?Pending SLP, PT and OT ?If continues to do better this afternoon, will transfer to a regular floor. ? ?ESRD ?Hyperkalemia ?Anion gap metabolic acidosis ?Nephrology is following ?Plan for HD today ? ?Osteomyelitis s/p debridement ?Continue vancomycin, cefepime and Flagyl per primary ?Pending wound culture ?Blood cultures negative to date ?Podiatry is following ? ?Type 2 diabetes ?A1c of 11.9. ?Increase Semglee to 25 units at bedtime and continue sliding scale ?Goal CBG less than 180 to optimize wound healing ? ? ?Labs   ?CBC: ?Recent Labs  ?Lab 02/02/22 ?0107 02/02/22 ?0920 02/03/22 ?5784 02/03/22 ?0408  ?WBC 12.0* 12.4* 13.6* 11.6*  ?NEUTROABS 9.1*  --   --   --   ?HGB 12.0* 11.8* 12.3* 11.6*  ?HCT 37.1* 37.0* 38.8* 36.2*  ?MCV 95.9 96.1 95.3 94.3  ?PLT 171 155 160 150  ? ? ? ?Basic Metabolic Panel: ?Recent Labs  ?Lab  02/02/22 ?0107 02/02/22 ?0604 02/03/22 ?8828 02/03/22 ?0408  ?NA 129* 131* 131*  130* 128*  ?K 3.5 3.6 5.5*  5.3* 6.1*  ?CL 87* 90* 92*  91* 90*  ?CO2 29 26 23  23  18*  ?GLUCOSE 479* 376* 278*  282* 358*  ?BUN 42* 43* 47*  48* 47*  ?CREATININE 11.09* 11.09* 13.10*  13.19* 13.14*  ?CALCIUM 9.0 8.7* 9.0  8.9 8.7*  ?MG  --  1.7 1.6* 1.6*  ?PHOS  --   --  3.1 3.4  ? ? ?GFR: ?Estimated Creatinine Clearance: 8.1 mL/min (A) (by C-G formula based on SCr of 13.14 mg/dL (H)). ?Recent Labs  ?Lab 02/02/22 ?0107 02/02/22 ?0329 02/02/22 ?0604 02/02/22 ?0920 02/03/22 ?0034 02/03/22 ?0408  ?WBC 12.0*  --   --   12.4* 13.6* 11.6*  ?LATICACIDVEN 3.1* 2.2* 2.5*  --  2.6* 2.5*  ? ? ? ?Liver Function Tests: ?Recent Labs  ?Lab 02/02/22 ?0107 02/02/22 ?0604 02/03/22 ?0037  ?AST 25 29 27  27   ?ALT 18 18 17  19   ?ALKPHOS 76 71 68  69  ?BILITOT 0.6 0.8 0.8  0.9  ?PROT 8.0 7.5 7.8  8.0  ?ALBUMIN 3.0* 2.9* 2.6*  2.7*  ? ? ?No results for input(s): LIPASE, AMYLASE in the last 168 hours. ?Recent Labs  ?Lab 02/03/22 ?0408  ?AMMONIA 42*  ? ? ?ABG ?   ?Component Value Date/Time  ? PHART 7.4 02/03/2022 0017  ? PCO2ART 37 02/03/2022 0017  ? PO2ART 131 (H) 02/03/2022 0017  ? HCO3 23.5 02/03/2022 0017  ? TCO2 30 07/19/2020 0754  ? ACIDBASEDEF 1.1 02/03/2022 0017  ? O2SAT 99.4 02/03/2022 0017  ? ?  ? ?Coagulation Profile: ?Recent Labs  ?Lab 02/02/22 ?0107 02/03/22 ?0037  ?INR 1.3* 1.3*  ? ? ? ?Cardiac Enzymes: ?No results for input(s): CKTOTAL, CKMB, CKMBINDEX, TROPONINI in the last 168 hours. ? ?HbA1C: ?Hgb A1c MFr Bld  ?Date/Time Value Ref Range Status  ?02/02/2022 06:04 AM 11.9 (H) 4.8 - 5.6 % Final  ?  Comment:  ?  (NOTE) ?Pre diabetes:          5.7%-6.4% ? ?Diabetes:              >6.4% ? ?Glycemic control for   <7.0% ?adults with diabetes ?  ?08/12/2019 05:08 PM 9.9 (H) 4.8 - 5.6 % Final  ?  Comment:  ?  (NOTE) ?Pre diabetes:          5.7%-6.4% ?Diabetes:              >6.4% ?Glycemic control for   <7.0% ?adults with diabetes ?  ? ? ?CBG: ?Recent Labs  ?Lab 02/02/22 ?2118 02/02/22 ?2356 02/03/22 ?0224 02/03/22 ?9179 02/03/22 ?1505  ?GLUCAP 213* 238* 310* 369* 391*  ? ? ? ?Review of Systems:   ?Unable to obtain due to patient condition ? ?Past Medical History:  ?He,  has a past medical history of CHF (congestive heart failure) ([place]), Diabetes mellitus without complication ([place]), ESRD on hemodialysis ([place]), Hypertension, Kidney failure, Myocardial infarction ([place]), and Nonischemic cardiomyopathy ([place]).  ? ?Surgical History:  ? ?Past Surgical History:  ?Procedure Laterality Date  ? A/V FISTULAGRAM N/A 08/15/2019  ? Procedure: A/V  FISTULAGRAM;  Surgeon: [name], MD;  Location: [facility];  Service: Cardiovascular;  Laterality: N/A;  ? AV FISTULA PLACEMENT    ? CARDIAC CATHETERIZATION  02/17/2018  ? LHC 02/17/18 ([name]): 30% pLAD, 30% dRCA, LVEDP 25-30, LVEF 25%.    ? CATARACT  EXTRACTION W/PHACO Right 03/29/2020  ? Procedure: CATARACT EXTRACTION PHACO AND INTRAOCULAR LENS PLACEMENT ([place])  RIGHT DIABETIC;  Surgeon: [name], MD;  Location: [facility] ORS;  Service: Ophthalmology;  Laterality: Right;  Lot # [id number] H ?[place]: 02:30.9 ?AP% 9.5% ?CDE: 14.38  ? EYE SURGERY Right   ? cataract removed  ? INSERTION OF DIALYSIS CATHETER  08/13/2019  ? Procedure: Insertion Of Dialysis Catheter;  Surgeon: [name], MD;  Location: [facility] OR;  Service: Vascular;;  ? INSERTION OF DIALYSIS CATHETER Left 07/19/2020  ? Procedure: INSERTION OF DIALYSIS CATHETER;  Surgeon: [name], MD;  Location: [facility];  Service: Vascular;  Laterality: Left;  ? IRRIGATION AND DEBRIDEMENT ABSCESS    ? PERIPHERAL VASCULAR BALLOON ANGIOPLASTY  08/15/2019  ? Procedure: PERIPHERAL VASCULAR BALLOON ANGIOPLASTY;  Surgeon: [name], MD;  Location: [place] [facility];  Service: Cardiovascular;;  ? REVISON OF ARTERIOVENOUS FISTULA Left 07/19/2020  ? Procedure: REVISON/PLICATION OF ARTERIOVENOUS FISTULA LEFT;  Surgeon: [name], MD;  Location: [place];  Service: Vascular;  Laterality: Left;  ? THROMBECTOMY AND REVISION OF ARTERIOVENTOUS (AV) GORETEX  GRAFT Left 08/13/2019  ? Procedure: THROMBECTOMY OF LEFT ARM ARTERIOVENTOUS (AV) FISTULA;  Surgeon: [name], MD;  [facility]: [facility];  Service: Vascular;  Laterality: Left;  ? TOE AMPUTATION    ?  ? ?Social History:  ? reports that he has never smoked. He has never used smokeless tobacco. He reports that he does not currently use alcohol. He reports that he does not use drugs.  ? ?Family History:  ?His family history includes Hypertension in his mother.   ? ?Allergies ?Allergies  ?Allergen Reactions  ? Sulfa Antibiotics Nausea And Vomiting  ?  ? ?Home Medications  ?Prior to Admission medications   ?Medication Sig Start Date End Date Taking? Authorizing Provider  ?lidocaine-prilocai

## 2022-02-03 NOTE — Plan of Care (Signed)
Care Plan being implemented. Care continues. ?

## 2022-02-03 NOTE — Consult Note (Signed)
? ? ?[facility] Infectious [facility]  ?                                                                                     ? ?Patient Identification: ?Patient Name: [name] YAM Sr. MRN: [id number] Admit Date: 02/02/2022  1:01 AM ?Today's Date: 02/03/2022 ?Reason for consult:  ?Requesting provider:  ? ?Principal Problem: ?  Osteomyelitis ([place]) ?Active Problems: ?  ESRD (end stage renal disease) ([place]) ?  DM2 (diabetes mellitus, type 2) ([place]) ?  HTN (hypertension) ?  Chronic systolic CHF (congestive heart failure) ([place]) ?  Acute metabolic encephalopathy ?  Severe sepsis ([place]) ?  Acute respiratory failure with hypoxia ([place]) ?  Protein-calorie malnutrition, mild ([place]) ?  GERD (gastroesophageal reflux disease) ?  Hyponatremia ?  Hyperkalemia ?  High anion gap metabolic acidosis ?  Anemia of chronic renal failure ? ? ?Antibiotics:  ?Vancomycin 3/18-c ?Cefepime 3/18-c ?Metronidazole 3/18-c  ? ?Lines/Hardware: left IJ HD catheter, left UE AVF ? 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 Cx growing E faecalis and morganella morganii ?-ABI normal in rt. Seen by vascular in 3/15 with no intervention recommended  ? ?# Septic/toxic encephalopathy - in the setting of sepsis/diabetic foot infection, seems to be improving on talking to daughter and RN. He is able to tell his name and place, drowsy. Does not follow commands. Off narcan drip dc;ed earlier this morning  ? ?# Acute hypoxic respiratory failure 2/2 above: on [place] ? ?# DM2 with uncontrolled  hyperglycemia - a1c 12.2  ?# ESRD on HD via Left UA AVF  ? ?Recommendations  ?Fu blood cultures, OR cultures and pathology  ?Continue Vancomycin, pharmacy to dose, cefepime and metronidazole ?Will consider switching cefepime to PO antibiotics  if feasible depending on sensi of Morganella given encephalopathy on admission/ESRD status  ?Fu Podiatry recs - plan for repeat OR for debridement  ?Needs good control of Blood glucose for healing  ?Monitor CBC and BMP ? ? ?Rest of the management as per the primary team. Please call with questions or concerns.  ?Thank you for the consult ? ?[name], MD ?Infectious Disease Physician ?[facility] ?[address] ?[place], [place] [address] ?Phone: [phone]  Fax: [phone] ? 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  Last seen normal on early morning of March 18 by his family.  Later on the day he was found lying on his side and confused.  Patient had a nonhealing diabetic foot ulcer overlying the recent amputation on the right lower extremity ? ?At ED septic picture ?Status post right foot I&D with revision of fifth ray amputation on 3/19 ? ?Patient was transferred to MICU overnight given lethargy and hypotension, was also started on narcan drip in the setting of opioid use (  narcan off now).  ? ?Imaging as below  ? ?ROS: unable to obtain given patient is drowsy with altered mental status  ?Per daughter at bedside, his mental status is improving compared to yesterday ?Per RN, he is able to tell his name and place but not oriented to time and situation  ? ?Past Medical History:  ?Diagnosis Date  ? CHF (congestive heart failure) ([place])   ? Diabetes mellitus without complication ([place])   ? type 2  ? ESRD on hemodialysis ([place])   ? mon wed fri  ? Hypertension   ? Kidney failure   ? Myocardial infarction [facility])   ? Nonischemic cardiomyopathy ([place])   ? ?Past Surgical History:  ?Procedure Laterality Date  ?  A/V FISTULAGRAM N/A 08/15/2019  ? Procedure: A/V FISTULAGRAM;  Surgeon: [name], MD;  Location: [facility];  Service: Cardiovascular;  Laterality: N/A;  ? AV FISTULA PLACEMENT    ? CARDIAC CATHETERIZATION  02/17/2018  ? LHC 02/17/18 ([name]): 30% pLAD, 30% dRCA, LVEDP 25-30, LVEF 25%.    ? CATARACT EXTRACTION W/PHACO Right 03/29/2020  ? Procedure: CATARACT EXTRACTION PHACO AND INTRAOCULAR LENS PLACEMENT ([place])  RIGHT DIABETIC;  Surgeon: [name], MD;  Location: [facility] ORS;  Service: Ophthalmology;  Laterality: Right;  Lot # [id number] H ?[place]: 02:30.9 ?AP% 9.5% ?CDE: 14.38  ? EYE SURGERY Right   ? cataract removed  ? INSERTION OF DIALYSIS CATHETER  08/13/2019  ? Procedure: Insertion Of Dialysis Catheter;  Surgeon: [name], MD;  [facility];  Service: Vascular;;  ? INSERTION OF DIALYSIS CATHETER Left 07/19/2020  ? Procedure: INSERTION OF DIALYSIS CATHETER;  Surgeon: [name], MD;  Location: [facility];  Service: Vascular;  Laterality: Left;  ? IRRIGATION AND DEBRIDEMENT ABSCESS    ? PERIPHERAL VASCULAR BALLOON ANGIOPLASTY  08/15/2019  ? Procedure: PERIPHERAL VASCULAR BALLOON ANGIOPLASTY;  Surgeon: [name], MD;  Location: [place] [facility];  Service: Cardiovascular;;  ? REVISON OF ARTERIOVENOUS FISTULA Left 07/19/2020  ? Procedure: REVISON/PLICATION OF ARTERIOVENOUS FISTULA LEFT;  Surgeon: [name], MD;  Location: [facility];  Service: Vascular;  Laterality: Left;  ? THROMBECTOMY AND REVISION OF ARTERIOVENTOUS (AV) GORETEX  GRAFT Left 08/13/2019  ? Procedure: THROMBECTOMY OF LEFT ARM ARTERIOVENTOUS (AV) FISTULA;  Surgeon: [name], MD;  [facility];  Service: Vascular;  Laterality: Left;  ? TOE AMPUTATION    ? ? ?Scheduled Meds: ? (feeding supplement) PROSource Plus  30 mL Oral BID BM  ? vitamin C  500 mg Oral Daily  ? Chlorhexidine Gluconate Cloth  6 each Topical Q0600  ? Chlorhexidine Gluconate Cloth  6 each Topical Q0600  ?  famotidine  20 mg Oral Daily  ? feeding supplement (NEPRO CARB STEADY)  237 mL Oral BID BM  ? heparin  5,000 Units Subcutaneous Q8H  ? insulin aspart  0-20 Units Subcutaneous Q4H  ? [START ON 02/04/2022] insulin glargine-yfgn  30 Units Subcutaneous Daily  ? metroNIDAZOLE  500 mg Oral Q12H  ? midazolam      ? multivitamin  1 tablet Oral QHS  ? mupirocin ointment  1 application. Nasal BID  ? [START ON 02/04/2022] pantoprazole  40 mg Oral Q1200  ? sucroferric oxyhydroxide  1,000 mg Oral TID WC  ? thiamine injection  100 mg Intravenous Daily  ? Or  ? thiamine  100 mg Oral Daily  ? zinc sulfate  220 mg Oral BID  ? ?Continuous Infusions: ? sodium chloride    ?  sodium chloride    ? ceFEPime (MAXIPIME) IV    ? lactated ringers    ? sodium chloride    ? vancomycin    ? ?PRN Meds:.sodium chloride, sodium chloride, acetaminophen **OR** acetaminophen, albuterol, alteplase, docusate sodium, heparin, lidocaine (PF), lidocaine-prilocaine, ondansetron **OR** ondansetron (ZOFRAN) IV, pentafluoroprop-tetrafluoroeth, polyethylene glycol, sodium chloride ? ?Allergies  ?Allergen Reactions  ? Sulfa Antibiotics Nausea And Vomiting  ? ?Social History  ? ?Socioeconomic History  ? Marital status: Divorced  ?  Spouse name: Not on file  ? Number of children: Not on file  ? Years of education: Not on file  ? Highest education level: Not on file  ?Occupational History  ? Not on file  ?Tobacco Use  ? Smoking status: Never  ? Smokeless tobacco: Never  ?Vaping Use  ? Vaping Use: Never used  ?Substance and Sexual Activity  ? Alcohol use: Not Currently  ? Drug use: Never  ? Sexual activity: Not on file  ?Other Topics Concern  ? Not on file  ?Social History Narrative  ? Not on file  ? ?Social Determinants of Health  ? ?Financial Resource Strain: Not on file  ?Food Insecurity: Not on file  ?Transportation Needs: Not on file  ?Physical Activity: Not on file  ?Stress: Not on file  ?Social Connections: Not on file  ?Intimate Partner Violence: Not on file   ? ?Breast Cancer-relatedfamily history is not on file. ? ?Vitals ?BP (!) 149/125   Pulse 78   Temp 97.7 ?F (36.5 ?C) (Axillary)   Resp (!) 22   Ht 6\' 1"  (1.854 m)   Wt 121.9 kg   SpO2 100%   BMI 35.46 kg/m

## 2022-02-03 NOTE — Progress Notes (Signed)
Patient transferred to 3M03, report given to Earlie Server RN  ?

## 2022-02-03 NOTE — Evaluation (Signed)
Physical Therapy Evaluation ?Patient Details ?Name: [name] [name]. ?MRN: [id number] ?DOB: [birth year] ?Today's Date: 02/03/2022 ? ?History of Present Illness ? 61 y.o. male  presents the ED 02/02/22 with altered mental status. Pt found to have osteomyelitis of R foot, sepsis and acute respiratory failure with hypoxia with acute metabolic encephalopathy. 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 Transferred to ICU after LoC and hypotension systolic in 38B placed on 5L O2 and given Narcan.PMH: diabetes mellitus type 2, CHF, ESRD, hypertension, myocardial infarction, nonischemic cardiomyopathy  ?Clinical Impression ? Evaluation limited by pt lethargy, he initially woke up and answered questions but quickly tired and closed eyes. Pt daughter alerted PT to fact that pt was still on bedpan. Pt able to initiate movement to roll to R off bed pan however ultimately requires total Ax2 to complete. Pt's daughter fills gaps in PLOF and home set up information. Pt lives essentially alone, in a single story home with 4 steps to enter. Pt's brother is in and out and brings him food throughout the week, and someone comes to clean periodically. Daughter works as a Administrator and is not available. Pt was independent with household ambulation and transportation was picking him up for dialysis. Pt was able to do his own bathing and dressing. Given NWB status on R LE and current limited mobility, PT recommending SNF level rehab at discharge. PT will continue to follow acutely. ?   ? ?Recommendations for follow up therapy are one component of a multi-disciplinary discharge planning process, led by the attending physician.  Recommendations may be updated based on patient status, additional functional criteria and insurance authorization. ? ?Follow Up Recommendations Skilled nursing-short term rehab (<3 hours/day) ? ?  ?Assistance Recommended at Discharge Frequent or constant Supervision/Assistance  ?Patient can return home with the  following ? Two people to help with walking and/or transfers;Two people to help with bathing/dressing/bathroom;Assistance with cooking/housework;Assistance with feeding;Direct supervision/assist for medications management;Direct supervision/assist for financial management;Assist for transportation;Help with stairs or ramp for entrance ? ?  ?Equipment Recommendations Rolling walker (2 wheels);BSC/3in1  ?Recommendations for Other Services ? OT consult  ?  ?Functional Status Assessment Patient has had a recent decline in their functional status and demonstrates the ability to make significant improvements in function in a reasonable and predictable amount of time.  ? ?  ?Precautions / Restrictions Precautions ?Precautions: Fall ?Restrictions ?Weight Bearing Restrictions: Yes ?RLE Weight Bearing: Non weight bearing  ? ?  ? ?Mobility ? Bed Mobility ?Overal bed mobility: Needs Assistance ?Bed Mobility: Rolling ?Rolling: Total assist ?  ?  ?  ?  ?General bed mobility comments: pt able to bend L knee and reach with L arm to rail requires total A to come onto side to remove bed pan, unable to progress mobility due to lethargy ?  ? ?Transfers ?  ?  ?  ?  ?  ?  ?  ?  ?  ?General transfer comment: deferred ?  ? ? ?  ? ? ? ? ?Pertinent Vitals/Pain Pain Assessment ?Pain Assessment: Faces ?Faces Pain Scale: Hurts a little bit ?Pain Location: generalized wtih movement ?Pain Descriptors / Indicators: Grimacing, Guarding, Moaning ?Pain Intervention(s): Limited activity within patient's tolerance, Monitored during session, Repositioned  ? ? ?Home Living Family/patient expects to be discharged to:: Private residence ?Living Arrangements: Other relatives (brother) ?Available Help at Discharge: Available PRN/intermittently;Family ?Type of Home: House ?Home Access: Stairs to enter ?Entrance Stairs-Rails: None ?Entrance Stairs-Number of Steps: 4 ?  ?Home Layout: One  level ?Home Equipment: None ?Additional Comments: daughter confirmed and  filled in gaps in pt report of home setup and PLOF  ?  ?Prior Function Prior Level of Function : Needs assist ?  ?  ?  ?Physical Assist : ADLs (physical) ?  ?  ?Mobility Comments: independent with household mobility, gets transportation to dialysis ?ADLs Comments: independent with ADLs, brother provides for iADLs ?  ? ? ?Extremity/Trunk Assessment  ? Upper Extremity Assessment ?Upper Extremity Assessment: Generalized weakness ?  ? 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  ? ?   ?Communication  ? Communication: Expressive difficulties;Other (comment) (likely due to lethargy)  ?Cognition Arousal/Alertness: Lethargic ?Behavior During Therapy: Flat affect ?Overall Cognitive Status: Difficult to assess ?  ?  ?  ?  ?  ?  ?  ?  ?  ?  ?  ?  ?  ?  ?  ?  ?  ?  ?  ? ?  ?General Comments General comments (skin integrity, edema, etc.): VSS on RA ? ?  ?   ? ?Assessment/Plan  ?  ?PT Assessment Patient needs continued PT services  ?PT Problem List Decreased range of motion;Decreased activity tolerance;Decreased strength;Decreased balance;Decreased mobility;Decreased knowledge of use of DME ? ?   ?  ?PT Treatment Interventions DME instruction;Gait training;Stair training;Functional mobility training;Therapeutic activities;Therapeutic exercise;Balance training;Cognitive remediation;Patient/family education   ? ?PT Goals (Current goals can be found in the Care Plan section)  ?Acute Rehab PT Goals ?PT Goal Formulation: With family ?Time For Goal Achievement: 02/17/22 ?Potential to Achieve Goals: Good ? ?  ?Frequency Min 2X/week ?  ? ? ?   ?AM-PAC PT "6 Clicks" Mobility  ?Outcome Measure Help needed turning from your back to your side while in a flat bed without using bedrails?: Total ?Help needed moving from lying on your back to sitting on the side of a flat bed without using bedrails?:  Total ?Help needed moving to and from a bed to a chair (including a wheelchair)?: Total ?Help needed standing up from a chair using your arms (e.g., wheelchair or bedside chair)?: Total ?Help needed to walk in hospital room?: Total ?Help needed climbing 3-5 steps with a railing? : Total ?6 Click Score: 6 ? ?  ?End of Session   ?Activity Tolerance: Patient limited by lethargy ?Patient left: in bed;with call bell/phone within reach;with bed alarm set;with family/visitor present;Other (comment) (getting ready for dialysis) ?Nurse Communication: Mobility status ?PT Visit Diagnosis: Muscle weakness (generalized) (M62.81);Difficulty in walking, not elsewhere classified (R26.2) ?  ? ?Time: 1205-1221 ?PT Time Calculation (min) (ACUTE ONLY): 16 min ? ? ?Charges:   PT Evaluation ?$PT Eval Moderate Complexity: 1 Mod ?  ?  ?   ? ? ?Camil Wilhelmsen B. [name] PT, DPT ?Acute Rehabilitation Services ?Pager [phone] ?Office [phone] ? ? ?[place] ?02/03/2022, 1:06 PM ? ?

## 2022-02-03 NOTE — Progress Notes (Addendum)
Inpatient Diabetes Program Recommendations ? ?AACE/ADA: New Consensus Statement on Inpatient Glycemic Control (2015) ? ?Target Ranges:  Prepandial:   less than 140 mg/dL ?     Peak postprandial:   less than 180 mg/dL (1-2 hours) ?     Critically ill patients:  140 - 180 mg/dL  ? ?Lab Results  ?Component Value Date  ? GLUCAP 391 (H) 02/03/2022  ? HGBA1C 12.2 (H) 02/02/2022  ? ? ?Review of Glycemic Control ? Latest Reference Range & Units 02/02/22 21:18 02/02/22 23:56 02/03/22 02:24 02/03/22 04:14 02/03/22 07:23  ?Glucose-Capillary 70 - 99 mg/dL 213 (H) 238 (H) 310 (H) 369 (H) 391 (H)  ?(H): Data is abnormally high ?Diabetes history: Type 2 DM ?Outpatient Diabetes medications: Tresiba 30 units QD ?Current orders for Inpatient glycemic control: Novolog 0-20 units Q4H, Semglee 25 units QD ? ?Inpatient Diabetes Program Recommendations:   ? ?Consider carb modified diet?  ?Concern for resistant scale given ESRD. May want to consider switching to ICU protocol- Novolog 2-6 units Q4H. ? ?Is followed by Dr Merwyn Katos, outpatient endocrinology with last visit 12/10/21.  ?Per notes, let prescription of Tyler Aas run out and quit using [employer]. ? ?Will plan to see.  ? ?Addendum: Spoke with patient and daughter at bedside regarding outpatient diabetes management. Patient was inconsistent with responses and varied levels of alertness.  ?Reviewed patient's current A1c of 12.2%. Explained what a A1c is and what it measures. Also reviewed goal A1c with patient, importance of good glucose control @ home, and blood sugar goals. Discussed with daughter current plan for insulin, current glucose trends, recommendations, MD plan of care, need for improved glucose control an commorbidities.  ?Patient's daughter reports that he takes "one dose of insulin a day every day, but he does not always care about CHO intake".  ?Will need to follow back up with patient when more appropriate. ?Secure chat sent to MD. No additional orders  received. ?Discussed baseline with RN; in agreement. Per RN, patient was alert this AM while eating breakfast after swallow study. However, during conversations that the patient doesn't want to discuss, mental status appears less alert and conversant. Informed nurse of concern for hypoglycemia in esrd on HD in the setting of sepsis and multiple doses of short acting insulin.  ? ?Thanks, ?Bronson Curb, MSN, RNC-OB ?Diabetes Coordinator ?[phone] (8a-5p) ? ? ? ?

## 2022-02-03 NOTE — TOC Initial Note (Addendum)
Transition of Care (TOC) - Initial/Assessment Note  ? ? ?Patient Details  ?Name: [name] CUARESMA Sr. ?MRN: [id number] ?Date of Birth: Apr 13, 1961 ? ?Transition of Care (TOC) CM/SW Contact:    ?Paulene Floor Tatsuya Okray, LCSWA ?Phone Number: ?02/03/2022, 1:50 PM ? ?Clinical Narrative:                 ?CSW received consult for possible SNF placement at time of discharge. CSW spoke with patient and patient's daughter at bedside.  CSW spoke with the patient's daughter at bedside and explained PT's recommendations.  The daughter expressed understanding of PT recommendation and is agreeable to SNF placement at time of discharge.  The patient did not respond when asked questions about SNF and appeared apprehensive.  The patient's daughter expressed that the patient may not want to go to a SNF, but he does not have assistance at the home.  The daughter reported that she would want the patient to be at a facility near [place].  CSW inquired about the patient's wishes and he did not respond. CSW discussed insurance authorization process and will provide Medicare SNF ratings list.  The patient did respond by nodding his head up and down when asked if he had receive any COVID vaccines.  The daughter reports that he has received "at least 2" vaccinnes. CSW will send out referrals for review when the patient is closer to being medically ready and agrees to SNF placement. No further questions reported at this time.  ? ? ?Skilled Nursing Rehab Facilities-   [email or web address]   Ratings out of 5 possible   ?Name Address  Phone # Quality Care Staffing Health Inspection Overall  ?[facility] [address] [place] 5 5 2 4   ?[facility]  [address], [place] [phone] 4 2 5 5   ?[facility] [place], [place] 4 1 1 1   ?[place] [address] [place] 2 2 4 4   ?[facility] [address] [place] 1 1 2 1   ?[facility] [place] [phone] 2 1 4 3   ?[place] 5 2 2 3   ?[facility] [address], [place] 5 2 2 3   ?[address] ([place]) [place] [phone] 5 1 2 2   ?[facility] [address], [place] [phone] 4 1 1 1   ?[facility] [address] [place] [phone] 4 1 2 1   ?[facility] [place] [phone] 4 1 1 1   ?        ?[place], [place]      ?[facility] [place] 4 2 3 3   ?[facility] [place] [address], [place] 3 1 5 4   ?[place] [address] [place] [address], [place] [phone] 2 1 1 1   ?[facility] [address], [place] [phone] 2 2 3 3   ?        ?[address] (no [facility]) [address], [place] [phone] 4 5 5 5   ?[facility] (No [employer]) [address], [place] 4 1 4 3   ?[facility] (No [employer]) [address] [place] [phone] 5 5 5 5   ?[facility] [address] [employer] [phone] 3 2 4 4   ?[facility] [address] [place], [place] [phone] 3 3 4 4   ?[place]  [place], [place] [phone] 1 1 2 1   ?[place] [address] [place] [phone] 2 1 1 1   ?[facility] [place] 5 2 4 5   ?[place], [place] [phone] 3 1 1 1   ?[facility], [place] 2 1 2 1   ?        ?[facility] [address], [place] 1 1 1 1   ?[place] [address] [place]  [phone] 2 4 2 2   ?[facility] [address], [place] [phone] 5 2 3 4   ?[place] [facility] [address] [place] 2 1 1 1   ?[place] (No [employer]) [address] [place] 2 1 3 2   ?[facility] [address] [place] [phone] 3 1 1 1   ?        ?[facility] [place], [place] 5 4 5 5   ?[facility])  [address], [place] 2 2 3 3   ?[facility]) [place], [place] [phone] 3 2 4 4   ?[facility] [address] [place] 4 3 4 4   ?[address] [place] 3 3 1 1   ?[facility]) [address] [place] [phone] 2 2 4 4   ?  ? ?Expected Discharge Plan: [place] ?Barriers to Discharge: Continued Medical Work up, SNF Pending bed offer ? ? ?Patient Goals and CMS Choice ?  ?CMS Medicare.gov Compare Post Acute Care list provided to:: Patient Represenative (must comment) ?Choice offered to / list presented to : Adult Children ? ?Expected Discharge Plan and Services ?Expected Discharge Plan: [place] ?  ?  ?  ?  ?                ?  ?  ?  ?  ?  ?  ?  ?  ?  ?  ? ?Prior Living Arrangements/Services ?  ?  ?Patient language and need for interpreter reviewed:: Yes ?Do you feel safe going back to the place where you live?: Yes      ?Need for Family Participation in Patient Care: Yes (Comment) ?Care giver support system in place?: Yes (comment) ?  ?Criminal Activity/Legal Involvement Pertinent to Current Situation/Hospitalization: No - Comment as needed ? ?Activities of Daily Living ?  ?  ? ?Permission Sought/Granted ?Permission sought to share information with : Customer service manager ?  ?   ?   ?   ?   ? ?Emotional Assessment ?Appearance:: Appears stated age ?Attitude/Demeanor/Rapport: Lethargic ?Affect (typically observed): Quiet ?Orientation: : Oriented to Self ?  ?Psych Involvement: No (comment) ? ?Admission diagnosis:  Delirium [R41.0] ?Osteomyelitis ([place]) [M86.9] ?ESRD (end stage renal disease) ([place]) [N18.6] ?Right foot infection  [L08.9] ?Patient Active Problem List  ? Diagnosis Date Noted  ? Hyponatremia 02/03/2022  ? Hyperkalemia 02/03/2022  ? High anion gap metabolic acidosis 44/81/8563  ? Anemia of chronic renal failure 02/03/2022  ? Osteomyelitis ([place]) 02/02/2022  ? Acute metabolic encephalopathy 14/97/0263  ? Severe sepsis ([place]) 02/02/2022  ? Acute respiratory failure with hypoxia ([place]) 02/02/2022  ? Protein-calorie malnutrition, mild ([place]) 02/02/2022  ? GERD (gastroesophageal reflux disease) 02/02/2022  ? Chronic systolic CHF (congestive heart failure) ([place]) 08/14/2019  ? LBBB (left bundle branch block) 08/13/2019  ? Problem with vascular access 08/13/2019  ? Dialysis AV  fistula malfunction ([place]) 08/12/2019  ? ESRD (end stage renal disease) ([place]) 09/20/2018  ? Fever 09/20/2018  ? Nausea & vomiting 09/20/2018  ? DM2 (diabetes mellitus, type 2) ([place]) 09/20/2018  ? HTN (hypertension) 09/20/2018  ? ?PCP:  [facility] ?Pharmacy:   ?[facility] [place] [place] ?[place] [address] ?Phone: [phone] Fax: [phone] ? ? ? ? ?Social Determinants of Health (SDOH) Interventions ?  ? ?Readmission Risk Interventions ?No flowsheet data found. ? ? ?

## 2022-02-03 NOTE — Consult Note (Signed)
? ?NAME:  Ruvim Risko., MRN:  [id number], DOB:  [birth year], LOS: 1 ?ADMISSION DATE:  02/02/2022, CONSULTATION DATE: February 03, 2022 ?REFERRING MD: Hospitalist physician, CHIEF COMPLAINT: Drowsiness ? ?History of Present Illness:  ? ?Patient is 61 year old African-American male past medical history of end-stage renal disease hemodialysis chronic systolic heart failure type 2 diabetes admitted to [facility] 2 days ago with acute encephalopathy secondary to infection osteomyelitis in the right foot he was taken to the OR with debridement he was started on vancomycin cefepime Flagyl and last night he started to have low blood pressure on the altered mental status minimal response to Narcan we came up to by the bedside start him on a bolus for marginal low blood pressure of 500 cc after he received 250 cc with some improvement I gave him 2 mg of Narcan IV with more improvement than the previous dose neuro ordered CT scan head so patient will be going to CT scan head because of altered mental status and then to the ICU for Narcan drip.  He is on multiple doses of OxyContin extended release and morphine IV ?Objective   ?Blood pressure (!) 97/52, pulse 80, temperature 98 ?F (36.7 ?C), temperature source Oral, resp. rate 18, height 6\' 1"  (1.854 m), weight 119.6 kg, SpO2 98 %. ?   ?   ? ?Intake/Output Summary (Last 24 hours) at 02/03/2022 0146 ?Last data filed at 02/02/2022 2127 ?Gross per 24 hour  ?Intake 2340 ml  ?Output 5 ml  ?Net 2335 ml  ? ?Filed Weights  ? 02/02/22 0117 02/02/22 1030  ?Weight: 121.1 kg 119.6 kg  ? ? ?Examination: ?General: Lethargic GCS 11 respond to painful stimuli pupils pinpoint bilateral ?Neuro: Moving upper lower extremities without limitations ?HEENT:  atraumatic , no jaundice , dry mucous membranes  ?Cardiovascular:  Irregular irregular , ESM 2/6 in the aortic area  ?Lungs:  CTA bilateral , no wheezing or crackles  ?Abdomen:  Soft lax +BS , no tenderness . ?Musculoskeletal:  WNL ,  normal pulses  ?Skin:  No rash   ? ?Assessment & Plan:  ? ? ?--Severe toxic metabolic encephalopathy polypharmacy narcotic with the renal failure start Narcan drip CT scan will be done to rule out any structural brain disease ? ?--Osteomyelitis status postdebridement continue with Flagyl vancomycin and cefepime ? ? ?--End-stage renal disease he is on dialysis ? ?--Critical care time spent the care of this patient is 48 minutes. ? ? ?Labs   ?CBC: ?Recent Labs  ?Lab 02/02/22 ?0107 02/02/22 ?0920 02/03/22 ?0037  ?WBC 12.0* 12.4* 13.6*  ?NEUTROABS 9.1*  --   --   ?HGB 12.0* 11.8* 12.3*  ?HCT 37.1* 37.0* 38.8*  ?MCV 95.9 96.1 95.3  ?PLT 171 155 160  ? ? ?Basic Metabolic Panel: ?Recent Labs  ?Lab 02/02/22 ?0107 02/02/22 ?0604 02/03/22 ?0037  ?NA 129* 131* 131*  130*  ?K 3.5 3.6 5.5*  5.3*  ?CL 87* 90* 92*  91*  ?CO2 29 26 23  23   ?GLUCOSE 479* 376* 278*  282*  ?BUN 42* 43* 47*  48*  ?CREATININE 11.09* 11.09* 13.10*  13.19*  ?CALCIUM 9.0 8.7* 9.0  8.9  ?MG  --  1.7 1.6*  ?PHOS  --   --  3.1  ? ?GFR: ?Estimated Creatinine Clearance: 8.2 mL/min (A) (by C-G formula based on SCr of 13.19 mg/dL (H)). ?Recent Labs  ?Lab 02/02/22 ?0107 02/02/22 ?0329 02/02/22 ?0604 02/02/22 ?0920 02/03/22 ?0037  ?WBC 12.0*  --   --  12.4* 13.6*  ?LATICACIDVEN 3.1* 2.2* 2.5*  --  2.6*  ? ? ?Liver Function Tests: ?Recent Labs  ?Lab 02/02/22 ?0107 02/02/22 ?0604 02/03/22 ?0037  ?AST 25 29 27  27   ?ALT 18 18 17  19   ?ALKPHOS 76 71 68  69  ?BILITOT 0.6 0.8 0.8  0.9  ?PROT 8.0 7.5 7.8  8.0  ?ALBUMIN 3.0* 2.9* 2.6*  2.7*  ? ?No results for input(s): LIPASE, AMYLASE in the last 168 hours. ?No results for input(s): AMMONIA in the last 168 hours. ? ?ABG ?   ?Component Value Date/Time  ? PHART 7.4 02/03/2022 0017  ? PCO2ART 37 02/03/2022 0017  ? PO2ART 131 (H) 02/03/2022 0017  ? HCO3 23.5 02/03/2022 0017  ? TCO2 30 07/19/2020 0754  ? ACIDBASEDEF 1.1 02/03/2022 0017  ? O2SAT 99.4 02/03/2022 0017  ?  ? ?Coagulation Profile: ?Recent Labs  ?Lab  02/02/22 ?0107 02/03/22 ?0037  ?INR 1.3* 1.3*  ? ? ?Cardiac Enzymes: ?No results for input(s): CKTOTAL, CKMB, CKMBINDEX, TROPONINI in the last 168 hours. ? ?HbA1C: ?Hgb A1c MFr Bld  ?Date/Time Value Ref Range Status  ?02/02/2022 06:04 AM 11.9 (H) 4.8 - 5.6 % Final  ?  Comment:  ?  (NOTE) ?Pre diabetes:          5.7%-6.4% ? ?Diabetes:              >6.4% ? ?Glycemic control for   <7.0% ?adults with diabetes ?  ?08/12/2019 05:08 PM 9.9 (H) 4.8 - 5.6 % Final  ?  Comment:  ?  (NOTE) ?Pre diabetes:          5.7%-6.4% ?Diabetes:              >6.4% ?Glycemic control for   <7.0% ?adults with diabetes ?  ? ? ?CBG: ?Recent Labs  ?Lab 02/02/22 ?1257 02/02/22 ?1420 02/02/22 ?1650 02/02/22 ?2118 02/02/22 ?2356  ?GLUCAP 326* 229* 173* 213* 238*  ? ? ?Review of Systems:   ?Unable to obtain due to patient condition ? ?Past Medical History:  ?He,  has a past medical history of CHF (congestive heart failure) ([place]), Diabetes mellitus without complication ([place]), ESRD on hemodialysis ([place]), Hypertension, Kidney failure, Myocardial infarction ([place]), and Nonischemic cardiomyopathy ([place]).  ? ?Surgical History:  ? ?Past Surgical History:  ?Procedure Laterality Date  ? A/V FISTULAGRAM N/A 08/15/2019  ? Procedure: A/V FISTULAGRAM;  Surgeon: [name], MD;  Location: [place] [facility];  Service: Cardiovascular;  Laterality: N/A;  ? AV FISTULA PLACEMENT    ? CARDIAC CATHETERIZATION  02/17/2018  ? LHC 02/17/18 ([name]): 30% pLAD, 30% dRCA, LVEDP 25-30, LVEF 25%.    ? CATARACT EXTRACTION W/PHACO Right 03/29/2020  ? Procedure: CATARACT EXTRACTION PHACO AND INTRAOCULAR LENS PLACEMENT ([place])  RIGHT DIABETIC;  Surgeon: [name], MD;  Location: [facility] OR[facility];  Service: Ophthalmology;  Laterality: Right;  Lot # [id number] H ?[place]: 02:30.9 ?AP% 9.5% ?CDE: 14.38  ? EYE SURGERY Right   ? cataract removed  ? INSERTION OF DIALYSIS CATHETER  08/13/2019  ? Procedure: Insertion Of Dialysis Catheter;  Surgeon: [name], MD;   Location: [facility] OR;  Service: Vascular;;  ? INSERTION OF DIALYSIS CATHETER Left 07/19/2020  ? Procedure: INSERTION OF DIALYSIS CATHETER;  Surgeon: [name], MD;  Location: [place];  Service: Vascular;  Laterality: Left;  ? IRRIGATION AND DEBRIDEMENT ABSCESS    ? PERIPHERAL VASCULAR BALLOON ANGIOPLASTY  08/15/2019  ? Procedure: PERIPHERAL VASCULAR BALLOON ANGIOPLASTY;  Surgeon: [name], MD;  Location:  [facility];  Service: Cardiovascular;;  ? REVISON OF ARTERIOVENOUS FISTULA Left 07/19/2020  ? Procedure: REVISON/PLICATION OF ARTERIOVENOUS FISTULA LEFT;  Surgeon: [name], MD;  Location: [place];  Service: Vascular;  Laterality: Left;  ? THROMBECTOMY AND REVISION OF ARTERIOVENTOUS (AV) GORETEX  GRAFT Left 08/13/2019  ? Procedure: THROMBECTOMY OF LEFT ARM ARTERIOVENTOUS (AV) FISTULA;  Surgeon: [name], MD;  Location: [facility] OR;  Service: Vascular;  Laterality: Left;  ? TOE AMPUTATION    ?  ? ?Social History:  ? reports that he has never smoked. He has never used smokeless tobacco. He reports that he does not currently use alcohol. He reports that he does not use drugs.  ? ?Family History:  ?His family history includes Hypertension in his mother.  ? ?Allergies ?Allergies  ?Allergen Reactions  ? Sulfa Antibiotics Nausea And Vomiting  ?  ? ?Home Medications  ?Prior to Admission medications   ?Medication Sig Start Date End Date Taking? Authorizing Provider  ?lidocaine-prilocaine (EMLA) cream Apply 1 application topically every Monday, Wednesday, and Friday with hemodialysis.  08/17/18  Yes [provider]  ?sucroferric oxyhydroxide (VELPHORO) 500 MG chewable tablet Chew 1,000 mg by mouth 3 (three) times daily with meals.   Yes [provider]  ?TRESIBA FLEXTOUCH 100 UNIT/ML FlexTouch Pen Inject 30 Units into the skin daily. 07/16/20  Yes [provider]  ?famotidine (PEPCID) 20 MG tablet Take 1 tablet (20 mg total) by mouth at bedtime. ?Patient not  taking: Reported on 02/02/2022 08/16/19   Kinnie Feil, MD  ?  ? ?Critical care time: Critical care time spent the care of this patient is 48 minutes ?  ? ? ? ?  ?

## 2022-02-03 NOTE — Plan of Care (Signed)
?  Problem: Clinical Measurements: ?Goal: Will remain free from infection ?Outcome: Progressing ?Goal: Diagnostic test results will improve ?Outcome: Progressing ?Goal: Respiratory complications will improve ?Outcome: Progressing ?  ?Problem: Education: ?Goal: Knowledge of General Education information will improve ?Description: Including pain rating scale, medication(s)/side effects and non-pharmacologic comfort measures ?Outcome: Not Progressing ?  ?Problem: Health Behavior/Discharge Planning: ?Goal: Ability to manage health-related needs will improve ?Outcome: Not Progressing ?  ?Problem: Clinical Measurements: ?Goal: Ability to maintain clinical measurements within normal limits will improve ?Outcome: Not Progressing ?Goal: Cardiovascular complication will be avoided ?Outcome: Not Progressing ?  ?Problem: Activity: ?Goal: Risk for activity intolerance will decrease ?Outcome: Not Progressing ?  ?Problem: Nutrition: ?Goal: Adequate nutrition will be maintained ?Outcome: Not Progressing ?  ?Problem: Coping: ?Goal: Level of anxiety will decrease ?Outcome: Not Progressing ?  ?Problem: Elimination: ?Goal: Will not experience complications related to bowel motility ?Outcome: Not Progressing ?Goal: Will not experience complications related to urinary retention ?Outcome: Not Progressing ?  ?Problem: Pain Managment: ?Goal: General experience of comfort will improve ?Outcome: Not Progressing ?  ?Problem: Safety: ?Goal: Ability to remain free from injury will improve ?Outcome: Not Progressing ?  ?Problem: Skin Integrity: ?Goal: Risk for impaired skin integrity will decrease ?Outcome: Not Progressing ?  ?

## 2022-02-03 NOTE — Progress Notes (Signed)
Subjective: [name] is a 61 y.o. male patient seen at bedside, resting comfortably in no acute distress with speech therapy present.  Patient is s/p day #1 Right I&D with revision of fifth ray amputation site. Patient denies pain at surgical site and is currently having issues with delayed speech and slight tremor noted of the right hand.  Patient is assisted by daughter at bedside who reports that her father has been dealing with issues with this right foot for some time started out as a small wound that progressively got worse.  She reports that she has been helping at home prior to this admission to do his wound care.  No other issues noted. ? ?Patient Active Problem List  ? Diagnosis Date Noted  ? Osteomyelitis ([place]) 02/02/2022  ? Acute metabolic encephalopathy 85/27/7824  ? Sepsis ([place]) 02/02/2022  ? Acute respiratory failure with hypoxia ([place]) 02/02/2022  ? Protein-calorie malnutrition, mild ([place]) 02/02/2022  ? GERD (gastroesophageal reflux disease) 02/02/2022  ? Chronic systolic CHF (congestive heart failure) ([place]) 08/14/2019  ? LBBB (left bundle branch block) 08/13/2019  ? Problem with vascular access 08/13/2019  ? Dialysis AV fistula malfunction ([place]) 08/12/2019  ? ESRD (end stage renal disease) ([place]) 09/20/2018  ? Fever 09/20/2018  ? Nausea & vomiting 09/20/2018  ? DM2 (diabetes mellitus, type 2) ([place]) 09/20/2018  ? HTN (hypertension) 09/20/2018  ? ? ? ?Current Facility-Administered Medications:  ?  (feeding supplement) PROSource Plus liquid 30 mL, 30 mL, Oral, BID BM, Gherghe, Costin M, MD ?  0.9 %  sodium chloride infusion, 100 mL, Intravenous, PRN, Tobie Poet E, NP ?  0.9 %  sodium chloride infusion, 100 mL, Intravenous, PRN, Adelfa Koh, NP ?  acetaminophen (TYLENOL) tablet 650 mg, 650 mg, Oral, Q6H PRN, 650 mg at 02/02/22 1540 **OR** acetaminophen (TYLENOL) suppository 650 mg, 650 mg, Rectal, Q6H PRN, [name], MD ?  albuterol (PROVENTIL) (2.5 MG/3ML) 0.083%  nebulizer solution 2.5 mg, 2.5 mg, Nebulization, Q2H PRN, [name], MD ?  alteplase (CATHFLO ACTIVASE) injection 2 mg, 2 mg, Intracatheter, Once PRN, Adelfa Koh, NP ?  ascorbic acid (VITAMIN C) tablet 500 mg, 500 mg, Oral, Daily, Gherghe, Costin M, MD ?  ceFEPIme (MAXIPIME) 2 g in sodium chloride 0.9 % 100 mL IVPB, 2 g, Intravenous, Q M,W,F-2000, [name], MD ?  Chlorhexidine Gluconate Cloth 2 % PADS 6 each, 6 each, Topical, Q0600, Adelfa Koh, NP, 6 each at 02/03/22 [phone]  Chlorhexidine Gluconate Cloth 2 % PADS 6 each, 6 each, Topical, Q0600, Margaretha Seeds, MD, 6 each at 02/03/22 [phone] ?  docusate sodium (COLACE) capsule 100 mg, 100 mg, Oral, BID PRN, [place] Crane M, PA-C ?  famotidine (PEPCID) tablet 20 mg, 20 mg, Oral, Daily, Caren Griffins, MD, 20 mg at 02/02/22 2126 ?  feeding supplement (NEPRO CARB STEADY) liquid 237 mL, 237 mL, Oral, BID BM, Gherghe, Vella Redhead, MD ?  heparin injection 1,000 Units, 1,000 Units, Dialysis, PRN, Adelfa Koh, NP ?  heparin injection 5,000 Units, 5,000 Units, Subcutaneous, Q8H, [name], MD, 5,000 Units at 02/03/22 0554 ?  insulin aspart (novoLOG) injection 0-20 Units, 0-20 Units, Subcutaneous, Q4H, Dela Newman Nickels, MD, 20 Units at 02/03/22 1149 ?  insulin glargine-yfgn (SEMGLEE) injection 25 Units, 25 Units, Subcutaneous, Daily, Gaylan Gerold, DO, 25 Units at 02/03/22 [phone] ?  [COMPLETED] lactated ringers bolus 1,000 mL, 1,000 mL, Intravenous, Once, Stopped at 02/02/22 0313 **AND** lactated ringers bolus 1,000 mL, 1,000 mL,  Intravenous, Once **AND** [DISCONTINUED] lactated ringers bolus 1,000 mL, 1,000 mL, Intravenous, Once **AND** [COMPLETED] lactated ringers bolus 1,000 mL, 1,000 mL, Intravenous, Once, Ripley Fraise, MD, Stopped at 02/02/22 0539 ?  lidocaine (PF) (XYLOCAINE) 1 % injection 5 mL, 5 mL, Intradermal, PRN, Adelfa Koh, NP ?  lidocaine-prilocaine (EMLA) cream 1 application., 1 application.,  Topical, PRN, Adelfa Koh, NP ?  metroNIDAZOLE (FLAGYL) tablet 500 mg, 500 mg, Oral, Q12H, Gherghe, Costin M, MD ?  midazolam (VERSED) 2 MG/2ML injection, , , ,  ?  multivitamin (RENA-VIT) tablet 1 tablet, 1 tablet, Oral, QHS, Gherghe, Costin M, MD ?  mupirocin ointment (BACTROBAN) 2 % 1 application., 1 application., Nasal, BID, Margaretha Seeds, MD, 1 application. at 02/03/22 0948 ?  naloxone (NARCAN) 0.4 MG/ML injection, , , ,  ?  ondansetron (ZOFRAN) tablet 4 mg, 4 mg, Oral, Q6H PRN **OR** ondansetron (ZOFRAN) injection 4 mg, 4 mg, Intravenous, Q6H PRN, [name], MD ?  Derrill Memo ON 02/04/2022] pantoprazole (PROTONIX) EC tablet 40 mg, 40 mg, Oral, Q1200, Gherghe, Vella Redhead, MD ?  pentafluoroprop-tetrafluoroeth (GEBAUERS) aerosol 1 application., 1 application., Topical, PRN, Adelfa Koh, NP ?  polyethylene glycol (MIRALAX / GLYCOLAX) packet 17 g, 17 g, Oral, Daily PRN, [name], MD ?  sodium chloride 0.9 % bolus 250 mL, 250 mL, Intravenous, Once PRN, Shela Leff, MD ?  sucroferric oxyhydroxide (VELPHORO) chewable tablet 1,000 mg, 1,000 mg, Oral, TID WC, [name], MD ?  thiamine (B-1) injection 100 mg, 100 mg, Intravenous, Daily, 100 mg at 02/03/22 0242 **OR** thiamine tablet 100 mg, 100 mg, Oral, Daily, Donnetta Simpers, MD ?  vancomycin (VANCOCIN) IVPB 1000 mg/200 mL premix, 1,000 mg, Intravenous, Q M,W,F-HD, [name], MD ?  zinc sulfate capsule 220 mg, 220 mg, Oral, BID, Caren Griffins, MD ? ?Allergies  ?Allergen Reactions  ? Sulfa Antibiotics Nausea And Vomiting  ? ? ? ?Objective: ?Today's Vitals  ? 02/03/22 0900 02/03/22 1000 02/03/22 1100 02/03/22 1200  ?BP: 135/86 139/84 131/71 108/73  ?Pulse: 71 71 71 70  ?Resp: (!) 22 19 16  (!) 23  ?Temp:    97.9 ?F (36.6 ?C)  ?TempSrc:    Bladder  ?SpO2: 94% 94% 94% 98%  ?Weight:      ?Height:      ?PainSc:    0-No pain  ? ? ?General: No acute distress ? ?Right Lower extremity: Dressing to Right foot clean, dry, intact.  No strikethrough noted, Upon removal of dressings there is a large wound at the dorsal lateral right foot that measures greater than 5 cm with exposed subcutaneous tissue tendon and bone at the proximal resection site of the revised fifth ray amputation, there is small areas of marginal necrosis and active bleeding that is noted in the wound bed there is no purulence, no malodor, localized edema, localized erythema and minimal warmth.  No pain to palpation to foot or calf. ? ? ? ? ? ?Assessment and Plan:  ?Problem List Items Addressed This Visit   ? ?  ? Genitourinary  ? ESRD (end stage renal disease) ([place]) - Primary  ?  Consult nephrology ?HD schedule MWF at [facility] ?Last dialysis was Friday, had full treatment according to family ?Vascular congestion on chest x-ray with 3 L nasal cannula oxygen requirement ?BUN 42, creatinine 11.09, potassium 3.5 ?Lactic acid initially elevated 3.1, repeat pending ?Continue Velphoro ?Continue to monitor ?Being [facility] at [facility] for potential renal transplant ?  ?  ? ?Other Visit Diagnoses   ? ?  Delirium      ? Right foot infection      ? Relevant Medications  ? ceFEPIme (MAXIPIME) 2 g in sodium chloride 0.9 % 100 mL IVPB (Completed)  ? metroNIDAZOLE (FLAGYL) IVPB 500 mg (Completed)  ? vancomycin (VANCOREADY) IVPB 2000 mg/400 mL (Completed)  ? vancomycin (VANCOCIN) IVPB 1000 mg/200 mL premix  ? ceFEPIme (MAXIPIME) 2 g in sodium chloride 0.9 % 100 mL IVPB (Start on 02/03/2022  8:00 PM)  ? mupirocin ointment (BACTROBAN) 2 % 1 application.  ? metroNIDAZOLE (FLAGYL) tablet 500 mg (Start on 02/03/2022 10:00 PM)  ? ?  ? ? ?-Patient seen and evaluated at bedside ?-Chart reviewed ?-Previous vascular note from Dr. Donnetta Hutching reviewed with no vascular intervention warranted ?-Dressing change preformed; applied saline wet-to-dry dressing to right foot ?-Orders are in place for nursing to assist with dressing changes daily ?-Discussed with daughter because there is a few small areas of  marginal necrosis likely patient will need a repeat debridement and application of wound VAC to help with healing this extensive wound however at this time due to patient's delirium recommend to give patient a few da

## 2022-02-03 NOTE — Progress Notes (Signed)
eLink Physician-Brief Progress Note ?Patient Name: AMARIE TARTE Sr. ?DOB: 30-Jun-1961 ?MRN: [id number] ? ? ?Date of Service ? 02/03/2022  ?HPI/Events of Note ? 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 He underwent irrigation and debridement of the R foot and ray amputation of the 5th digit. Overnight, pt became lethargic, waking only to sternal rub. He was given narcan with some improvement. He was then started on a narcan drip and admitted to the ICU  ?eICU Interventions ? Acute encephalopathy ?- Likely toxic/metabolic, from opiates.  ?- Started on narcan drip. Will titrate as per protocol ?- CT head negative for acute process ?- No significant CO2 retention on ABG ?- Pt not hypoglycemic ?- Na corrects to 135 when accounting for glucose ?- BUN/Creatinine stable, pt ongoing dialysis ?- Will avoid opiates and other medications which may potentially exacerbate lethargy ? ?Osteomyelitis ?- Will continue antibiotic therapy. Will follow cultures and deescalate as warranted ?- Pt underwent source control with surgery on 3/19.  ?- Trend WBC, lactate, temperature curve.  ? ?Hyperglycemia ?- Placed on resistant dose SSI q4h ?- Will monitor glucose trends, make further adjustments as warranted.   ? ? ? ?  ? ?[place] ?02/03/2022, 2:32 AM ?

## 2022-02-03 NOTE — Progress Notes (Addendum)
?   02/02/22 2329  ?Assess: MEWS Score  ?Temp 98 ?F (36.7 ?C)  ?BP (!) 84/52  ?Pulse Rate 79  ?Resp 16  ?SpO2 96 %  ?Assess: MEWS Score  ?MEWS Temp 0  ?MEWS Systolic 1  ?MEWS Pulse 0  ?MEWS RR 0  ?MEWS LOC 0  ?MEWS Score 1  ?MEWS Score Color Green  ?Treat  ?Pain Scale 0-10  ?Pain Score 0  ?Take Vital Signs  ?Increase Vital Sign Frequency  Yellow: Q 2hr X 2 then Q 4hr X 2, if remains yellow, continue Q 4hrs  ?Escalate  ?MEWS: Escalate Yellow: discuss with charge nurse/RN and consider discussing with provider and RRT  ?Notify: Charge Nurse/RN  ?Name of Charge Nurse/RN Notified Nikki/ RN  ?Date Charge Nurse/RN Notified 02/02/22  ?Time Charge Nurse/RN Notified 2329  ?Notify: Provider  ?Provider Name/Title V. Rathore/MD  ?Date Provider Notified 02/02/22  ?Time Provider Notified 2329  ?Notification Type Page  ?Notification Reason Change in status ?(BP low, patient getting lethergic)  ?Provider response See new orders ?(plan to transfer to ICU)  ?Date of Provider Response 02/02/22  ?Notify: Rapid Response  ?Name of Rapid Response RN Notified Mindi /RN  ?Date Rapid Response Notified 02/02/22  ?Time Rapid Response Notified 2329  ? ? ?

## 2022-02-03 NOTE — Progress Notes (Signed)
[facility] ?Progress Note ? ?Subjective: seen in ICU, lethargic still ? ?Vitals:  ? 02/03/22 1715 02/03/22 1730 02/03/22 1745 02/03/22 1800  ?BP: 92/77 (!) 106/94 111/85 107/67  ?Pulse: (!) 104 75 75 74  ?Resp: 14 (!) 22 (!) 22 (!) 25  ?Temp:      ?TempSrc:      ?SpO2: (!) 85% 100% 100% 100%  ?Weight:      ?Height:      ? ? ?Exam: ?General: Lehargic; NAD; Responds to voice ?Head: NCAT sclera not icteric ?Lungs: CTA clear anterior. Diminished bilaterally. No wheeze, rales or rhonchi. Breathing is unlabored. ?Heart: RRR. No murmur, rubs or gallops.  ?Abdomen: soft, nontender, +BS ?Lower extremities: no edema BL LE; R foot wrapped with ACE bandage ?Neuro: as above, moves all extremities spontaneously. ?Dialysis Access: L AVF (+) B/T ?  ?Dialysis Orders:  MWF [facility] ?4h 41min  450/500    117.8kg   2/ 2.5Ca bath  Hep 6000  L AVF ?- calcitriol 1.34mcg PO qHD-last 01/31/22 ?  ?Assessment/Plan: ?Osteomyelitis-Podiatry following; MRI R foot pending; on IV vanc/cefepime/Flagyl; S/p R foot I&D and revision 5th ray amputation ?ESRD - on HD MWF; patient lethargic, sepsis vs uremia. No signs of hypervolemia. HD today.  ?Hypertension/volume  - Blood pressures acceptable; euvolemic on exam ?Anemia of CKD - Hgb 11.8; No ESA/Fe needs at this time ?Secondary Hyperparathyroidism -  Continue VDRA with HD; will check PO4 in AM ?Nutrition - Advance to renal diet with fluid restriction when more awake ? ? ?Rob Doctor, hospital ?02/03/2022, 7:01 PM ? ? ?Recent Labs  ?Lab 02/02/22 ?0604 02/02/22 ?0920 02/03/22 ?6948 02/03/22 ?0408  ?HGB  --    < > 12.3* 11.6*  ?ALBUMIN 2.9*  --  2.6*  2.7*  --   ?CALCIUM 8.7*  --  9.0  8.9 8.7*  ?PHOS  --   --  3.1 3.4  ?CREATININE 11.09*  --  13.10*  13.19* 13.14*  ?K 3.6  --  5.5*  5.3* 6.1*  ? < > = values in this interval not displayed.  ? ?Inpatient medications: ? (feeding supplement) PROSource Plus  30 mL Oral BID BM  ? vitamin C  500 mg Oral Daily  ? Chlorhexidine Gluconate Cloth  6 each Topical  Q0600  ? Chlorhexidine Gluconate Cloth  6 each Topical Q0600  ? famotidine  20 mg Oral Daily  ? feeding supplement (NEPRO CARB STEADY)  237 mL Oral BID BM  ? heparin  5,000 Units Subcutaneous Q8H  ? insulin aspart  0-20 Units Subcutaneous Q4H  ? [START ON 02/04/2022] insulin glargine-yfgn  30 Units Subcutaneous Daily  ? metroNIDAZOLE  500 mg Oral Q12H  ? midazolam      ? multivitamin  1 tablet Oral QHS  ? mupirocin ointment  1 application. Nasal BID  ? [START ON 02/04/2022] pantoprazole  40 mg Oral Q1200  ? sucroferric oxyhydroxide  1,000 mg Oral TID WC  ? thiamine injection  100 mg Intravenous Daily  ? Or  ? thiamine  100 mg Oral Daily  ? zinc sulfate  220 mg Oral BID  ? ? sodium chloride    ? sodium chloride    ? ceFEPime (MAXIPIME) IV    ? lactated ringers    ? sodium chloride    ? vancomycin 200 mL/hr at 02/03/22 1800  ? ?sodium chloride, sodium chloride, acetaminophen **OR** acetaminophen, albuterol, alteplase, docusate sodium, heparin, lidocaine (PF), lidocaine-prilocaine, ondansetron **OR** ondansetron (ZOFRAN) IV, pentafluoroprop-tetrafluoroeth, polyethylene glycol, sodium chloride ? ? ? ? ? ? ?

## 2022-02-03 NOTE — Evaluation (Signed)
Clinical/Bedside Swallow Evaluation ?Patient Details  ?Name: [name] SOPP Sr. ?MRN: [id number] ?Date of Birth: [birth year] ? ?Today's Date: 02/03/2022 ?Time: SLP Start Time (ACUTE ONLY): 1000 SLP Stop Time (ACUTE ONLY): 1023 ?SLP Time Calculation (min) (ACUTE ONLY): 23 min ? ?Past Medical History:  ?Past Medical History:  ?Diagnosis Date  ? CHF (congestive heart failure) ([place])   ? Diabetes mellitus without complication ([place])   ? type 2  ? ESRD on hemodialysis ([place])   ? mon wed fri  ? Hypertension   ? Kidney failure   ? Myocardial infarction [facility])   ? Nonischemic cardiomyopathy ([place])   ? ?Past Surgical History:  ?Past Surgical History:  ?Procedure Laterality Date  ? A/V FISTULAGRAM N/A 08/15/2019  ? Procedure: A/V FISTULAGRAM;  Surgeon: [name], MD;  Location: [place] [facility];  Service: Cardiovascular;  Laterality: N/A;  ? AV FISTULA PLACEMENT    ? CARDIAC CATHETERIZATION  02/17/2018  ? LHC 02/17/18 ([name]): 30% pLAD, 30% dRCA, LVEDP 25-30, LVEF 25%.    ? CATARACT EXTRACTION W/PHACO Right 03/29/2020  ? Procedure: CATARACT EXTRACTION PHACO AND INTRAOCULAR LENS PLACEMENT ([place])  RIGHT DIABETIC;  Surgeon: [name], MD;  Location: [facility] ORS;  Service: Ophthalmology;  Laterality: Right;  Lot # [id number] ?[place]: 02:30.9 ?AP% 9.5% ?CDE: 14.38  ? EYE SURGERY Right   ? cataract removed  ? INSERTION OF DIALYSIS CATHETER  08/13/2019  ? Procedure: Insertion Of Dialysis Catheter;  Surgeon: [name], MD;  Location: [facility] OR;  Service: Vascular;;  ? INSERTION OF DIALYSIS CATHETER Left 07/19/2020  ? Procedure: INSERTION OF DIALYSIS CATHETER;  Surgeon: [name], MD;  Location: [place];  Service: Vascular;  Laterality: Left;  ? IRRIGATION AND DEBRIDEMENT ABSCESS    ? PERIPHERAL VASCULAR BALLOON ANGIOPLASTY  08/15/2019  ? Procedure: PERIPHERAL VASCULAR BALLOON ANGIOPLASTY;  Surgeon: [name], MD;  Location: [place] [facility];  Service: Cardiovascular;;  ? REVISON OF  ARTERIOVENOUS FISTULA Left 07/19/2020  ? Procedure: REVISON/PLICATION OF ARTERIOVENOUS FISTULA LEFT;  Surgeon: [name], MD;  Location: [place];  Service: Vascular;  Laterality: Left;  ? THROMBECTOMY AND REVISION OF ARTERIOVENTOUS (AV) GORETEX  GRAFT Left 08/13/2019  ? Procedure: THROMBECTOMY OF LEFT ARM ARTERIOVENTOUS (AV) FISTULA;  Surgeon: [name], MD;  Location: [place];  Service: Vascular;  Laterality: Left;  ? TOE AMPUTATION    ? ?HPI:  ?Patient is 61 year old African-American male past medical history of end-stage renal disease hemodialysis chronic systolic heart failure type 2 diabetes admitted to [facility] 2 days ago with acute encephalopathy secondary to infection osteomyelitis in the right foot.  He was taken to the OR with debridement he was started on vancomycin cefepime Flagyl and overnight he started to have low blood pressure on the altered mental status minimal response to Narcan. Pt had been given pain medication.  ?  ?Assessment / Plan / Recommendation  ?Clinical Impression ? Pt demonstrates altered mental status that does impact initiation and awareness with oral intake. SLP and RN sat pt upright and SLP provided moderate verbal cues and dialogue to increase pts attention to eating and drinking activities. Pt able to state when he wanted or didnt want food or water. With assit from SLP pt held and cup and initiated sips. No signs of aspiration or impairment. Pt refused solids due to dizziness and no appetite, but was able to masticate ice without difficulty. Recommend pt resume an unrestricted regualr diet with thin liquids though assist may be  needed for him to attend to meals until mentation clears. Oral meds can be given with sips if pt is aware of activity. No SLP f/u needed.  ?SLP Visit Diagnosis: Dysphagia, unspecified (R13.10) ?   ?Aspiration Risk ? Mild aspiration risk  ?  ?Diet Recommendation Regular;Thin liquid  ? ?Liquid Administration via:  Cup;Straw ?Medication Administration: Whole meds with liquid ?Supervision: Staff to assist with self feeding;Full supervision/cueing for compensatory strategies ?Compensations: Slow rate;Small sips/bites;Minimize environmental distractions ?Postural Changes: Seated upright at 90 degrees  ?  ?Other  Recommendations Oral Care Recommendations: Oral care BID   ? ?Recommendations for follow up therapy are one component of a multi-disciplinary discharge planning process, led by the attending physician.  Recommendations may be updated based on patient status, additional functional criteria and insurance authorization. ? ?Follow up Recommendations No SLP follow up  ? ? ?  ?Assistance Recommended at Discharge    ?Functional Status Assessment    ?Frequency and Duration    ?  ?  ?   ? ?Prognosis    ? ?  ? ?Swallow Study   ?General HPI: Patient is 61 year old African-American male past medical history of end-stage renal disease hemodialysis chronic systolic heart failure type 2 diabetes admitted to [facility] 2 days ago with acute encephalopathy secondary to infection osteomyelitis in the right foot.  He was taken to the OR with debridement he was started on vancomycin cefepime Flagyl and overnight he started to have low blood pressure on the altered mental status minimal response to Narcan. Pt had been given pain medication. ?Type of Study: Bedside Swallow Evaluation ?Diet Prior to this Study: NPO ?Temperature Spikes Noted: No ?Respiratory Status: Room air ?History of Recent Intubation: No ?Behavior/Cognition: Alert;Requires cueing;Confused ?Oral Cavity Assessment: Within Functional Limits ?Oral Cavity - Dentition: Adequate natural dentition ?Vision: Functional for self-feeding ?Self-Feeding Abilities: Needs assist ?Patient Positioning: Upright in bed ?Baseline Vocal Quality: Normal ?Volitional Cough: Strong ?Volitional Swallow: Able to elicit  ?  ?Oral/Motor/Sensory Function Overall Oral Motor/Sensory Function:  Within functional limits   ?Ice Chips Ice chips: Within functional limits   ?Thin Liquid Thin Liquid: Within functional limits  ?  ?Nectar Thick Nectar Thick Liquid: Not tested   ?Honey Thick Honey Thick Liquid: Not tested   ?Puree Puree: Within functional limits ?Presentation: Spoon   ?Solid ? ? ?  Solid: Not tested  ? ?  ?Herbie Baltimore, MA CCC-SLP  ?Acute Rehabilitation Services ?Secure Chat Preferred ?Office [phone] ? ?Ennio Houp, Katherene Ponto ?02/03/2022,11:01 AM ? ? ? ?

## 2022-02-04 ENCOUNTER — Inpatient Hospital Stay (HOSPITAL_COMMUNITY): Payer: Medicare Other

## 2022-02-04 ENCOUNTER — Encounter (HOSPITAL_COMMUNITY): Payer: Self-pay | Admitting: Podiatry

## 2022-02-04 DIAGNOSIS — M7989 Other specified soft tissue disorders: Secondary | ICD-10-CM | POA: Diagnosis not present

## 2022-02-04 DIAGNOSIS — I5022 Chronic systolic (congestive) heart failure: Secondary | ICD-10-CM | POA: Diagnosis not present

## 2022-02-04 DIAGNOSIS — E1169 Type 2 diabetes mellitus with other specified complication: Secondary | ICD-10-CM | POA: Diagnosis not present

## 2022-02-04 DIAGNOSIS — R7881 Bacteremia: Secondary | ICD-10-CM

## 2022-02-04 DIAGNOSIS — M86171 Other acute osteomyelitis, right ankle and foot: Secondary | ICD-10-CM | POA: Diagnosis not present

## 2022-02-04 DIAGNOSIS — R410 Disorientation, unspecified: Secondary | ICD-10-CM

## 2022-02-04 DIAGNOSIS — J9601 Acute respiratory failure with hypoxia: Secondary | ICD-10-CM | POA: Diagnosis not present

## 2022-02-04 DIAGNOSIS — G9341 Metabolic encephalopathy: Secondary | ICD-10-CM | POA: Diagnosis not present

## 2022-02-04 DIAGNOSIS — B9562 Methicillin resistant Staphylococcus aureus infection as the cause of diseases classified elsewhere: Secondary | ICD-10-CM

## 2022-02-04 DIAGNOSIS — I10 Essential (primary) hypertension: Secondary | ICD-10-CM

## 2022-02-04 LAB — BLOOD CULTURE ID PANEL (REFLEXED) - BCID2

## 2022-02-04 LAB — CBC
HCT: 33.7 % — ABNORMAL LOW (ref 39.0–52.0)
Hemoglobin: 11.3 g/dL — ABNORMAL LOW (ref 13.0–17.0)
MCH: 31 pg (ref 26.0–34.0)
MCHC: 33.5 g/dL (ref 30.0–36.0)
MCV: 92.3 fL (ref 80.0–100.0)
Platelets: 176 10*3/uL (ref 150–400)
RBC: 3.65 MIL/uL — ABNORMAL LOW (ref 4.22–5.81)
RDW: 15.9 % — ABNORMAL HIGH (ref 11.5–15.5)
WBC: 11 10*3/uL — ABNORMAL HIGH (ref 4.0–10.5)
nRBC: 0 % (ref 0.0–0.2)

## 2022-02-04 LAB — COMPREHENSIVE METABOLIC PANEL
ALT: 17 U/L (ref 0–44)
AST: 27 U/L (ref 15–41)
Albumin: 2.2 g/dL — ABNORMAL LOW (ref 3.5–5.0)
Alkaline Phosphatase: 66 U/L (ref 38–126)
Anion gap: 13 (ref 5–15)
BUN: 35 mg/dL — ABNORMAL HIGH (ref 6–20)
CO2: 26 mmol/L (ref 22–32)
Calcium: 8.5 mg/dL — ABNORMAL LOW (ref 8.9–10.3)
Chloride: 93 mmol/L — ABNORMAL LOW (ref 98–111)
Creatinine, Ser: 9.58 mg/dL — ABNORMAL HIGH (ref 0.61–1.24)
GFR, Estimated: 6 mL/min — ABNORMAL LOW (ref >60)
Glucose, Bld: 216 mg/dL — ABNORMAL HIGH (ref 70–99)
Potassium: 3.7 mmol/L (ref 3.5–5.1)
Sodium: 132 mmol/L — ABNORMAL LOW (ref 135–145)
Total Bilirubin: 0.7 mg/dL (ref 0.3–1.2)
Total Protein: 7.1 g/dL (ref 6.5–8.1)

## 2022-02-04 LAB — GLUCOSE, CAPILLARY
Glucose-Capillary: 223 mg/dL — ABNORMAL HIGH (ref 70–99)
Glucose-Capillary: 197 mg/dL — ABNORMAL HIGH (ref 70–99)
Glucose-Capillary: 188 mg/dL — ABNORMAL HIGH (ref 70–99)
Glucose-Capillary: 174 mg/dL — ABNORMAL HIGH (ref 70–99)

## 2022-02-04 LAB — SURGICAL PATHOLOGY

## 2022-02-04 LAB — HEPATITIS B SURFACE ANTIBODY, QUANTITATIVE: Hep B S AB Quant (Post): 272.3 m[IU]/mL (ref >9.9)

## 2022-02-04 NOTE — Anesthesia Postprocedure Evaluation (Signed)
Anesthesia Post Note ? ?Patient: [name] SELOVER Sr. ? ?Procedure(s) Performed: IRRIGATION AND DEBRIDEMENT RIGHT FOOT (Right) ?AMPUTATION 5th RAY (Right) ? ?  ? ?Patient location during evaluation: PACU ?Anesthesia Type: MAC ?Level of consciousness: awake and alert ?Pain management: pain level controlled ?Vital Signs Assessment: post-procedure vital signs reviewed and stable ?Respiratory status: spontaneous breathing, nonlabored ventilation, respiratory function stable and patient connected to nasal cannula oxygen ?Cardiovascular status: stable and blood pressure returned to baseline ?Postop Assessment: no apparent nausea or vomiting ?Anesthetic complications: no ? ? ?No notable events documented. ? ?Last Vitals:  ?Vitals:  ? 02/04/22 0714 02/04/22 0805  ?BP:    ?Pulse:  70  ?Resp:  19  ?Temp: 37 ?C   ?SpO2:  100%  ?  ?Last Pain:  ?Vitals:  ? 02/04/22 0714  ?TempSrc: Axillary  ?PainSc:   ? ? ?  ?  ?  ?  ?  ?  ? ?[place] ? ? ? ? ?

## 2022-02-04 NOTE — Progress Notes (Addendum)
Inpatient Diabetes Program Recommendations ? ?AACE/ADA: New Consensus Statement on Inpatient Glycemic Control  ?Target Ranges:  Prepandial:   less than 140 mg/dL ?     Peak postprandial:   less than 180 mg/dL (1-2 hours) ?     Critically ill patients:  140 - 180 mg/dL  ? ? Latest Reference Range & Units 02/04/22 03:33 02/04/22 07:09  ?Glucose-Capillary 70 - 99 mg/dL 223 (H) 197 (H)  ? ? Latest Reference Range & Units 02/03/22 07:23 02/03/22 11:47 02/03/22 15:52 02/03/22 20:33  ?Glucose-Capillary 70 - 99 mg/dL 391 (H) 366 (H) 164 (H) 226 (H)  ? ? Latest Reference Range & Units 02/02/22 16:18  ?Hemoglobin A1C 4.8 - 5.6 % 12.2 (H)  ? ?Review of Glycemic Control ? ?Diabetes history: DM2 ?Outpatient Diabetes medications: Tresiba 30 units daily (noted on home med list); however, patient reports he was taking 70/30 BID (did not specify dose) ?Current orders for Inpatient glycemic control: Semglee 30 units daily, Novolog 0-20 units Q4H ? ?Inpatient Diabetes Program Recommendations:   ? ?Insulin: Please consider increasing Semglee to 35 units daily and adding Novolog 4 units TID with meals for meal coverage if patient eats at least 50% of meals. ? ?HbgA1C:  A1C 12.2% on 02/02/22 indicating an average glucose of 303 mg/dl over the past 2-3 months. ? ?NOTE: In reviewing the chart, noted patient seen Dr. Sheppard Evens with [facility] on 12/10/21. Per office note on 12/10/21, patient had ran out of [place] insulin for several months, he stopped using [employer] [place] as it stopped connecting to phone app, his A1C was 12.9% on 12/10/21 and it was noted he was no longer with girlfriend. Per office note patient is prescribed Tresiba 30 units daily, Novolog 10-15 units with meals plus 1:50 >150 mg/dl (1 unit for every 50 mg/dl above target glucose of 150 mg/dl).   ? ?Addendum 02/04/22@13 :15-Spoke with patient at bedside. Patient in bed in upright position with eyes closed and with lunch tray in front of him on bedside table. Patient  opened eyes to name and only provided short answers to questions. Patient would close eyes and seem to drift off to sleep but would again open eyes to his name. When asked about DM medications he is taking at home, patient stated he was using 70/30 insulin twice a day. Asked about the dose and patient stated he did not remember. Asked patient about taking the insulin consistently but he did not provide a reply and again closed eyes. Informed patient that diabetes coordinator will follow up again when he is more alert so we can discuss DM management further. Patient stated, "Ok" and closed his eyes again. ? ?Thanks, ?Barnie Alderman, RN, MSN, CDE ?Diabetes Coordinator ?Inpatient Diabetes Program ?[phone] (Team Pager from 8am to 5pm) ? ? ? ? ?

## 2022-02-04 NOTE — Progress Notes (Addendum)
? ?[facility] Infectious Diseases Follow Up Note ? ?Patient Identification: ?Patient Name: [name] BEEVER Sr. MRN: [id number] Admit Date: 02/02/2022  1:01 AM ?Age: 61 y.o.Today's Date: 02/04/2022 ? ?Reason for Visit: Follow up on osteomyelitis  ? ?Principal Problem: ?  Osteomyelitis ([place]) ?Active Problems: ?  ESRD (end stage renal disease) ([place]) ?  DM2 (diabetes mellitus, type 2) ([place]) ?  HTN (hypertension) ?  Chronic systolic CHF (congestive heart failure) ([place]) ?  Acute metabolic encephalopathy ?  Severe sepsis ([place]) ?  Acute respiratory failure with hypoxia ([place]) ?  Protein-calorie malnutrition, mild ([place]) ?  GERD (gastroesophageal reflux disease) ?  Hyponatremia ?  Hyperkalemia ?  High anion gap metabolic acidosis ?  Anemia of chronic renal failure ?  Necrotizing soft tissue infection ?  Diabetic foot infection ([place]) ? ?Antibiotics:  ?Vancomycin 3/18-c ?Cefepime 3/18-c ?Metronidazole 3/18-c  ?  ?Lines/Hardware: left IJ HD catheter, left UE AVF ? ?Interval Events: Continues to be afebrile, more awake and following commands per RN.  On BiPAP at night ? ? ?Assessment ?#Necrotizing soft tissue infection of right fifth TMA site/possible osteomyelitis ?-S/p I&D and revision of fifth toe amputation on 3/19.  Cultures growing E faecalis and Morganella morganii ?-ABI normal in right, seen by vascular in 3/15 with no intervention recommended ? ?#Septic/toxic encephalopathy-improving, in the setting of sepsis/diabetic foot infection ? ?# GPC in diplos and GNR in 1/4 bottles  ? ?#Acute hypoxic respiratory failure -in the setting of above ?#DM2 with hyperglycemia ?#ESRD on HD ? ?Recommendations ?Continue vancomycin and cefepime with HD ?Continue metronidazole p.o. ?Follow-up or cultures, and pathology, ID of GPC in blood cx ?Follow-up with podiatry Recs ?Fu MRI brain  ? ?Rest of the management as per the primary team. ?Thank you for the consult. Please page with pertinent  questions or concerns. ? ?______________________________________________________________________ ?Subjective ?patient seen and examined at the bedside. ?On BiPAP ?Response to voice/calling his name ?Per RN, he is more awake and follows commands ? ?Vitals ?BP (!) 97/52   Pulse 70   Temp 98.6 ?F (37 ?C) (Axillary)   Resp 19   Ht 6\' 1"  (1.854 m)   Wt 120.8 kg   SpO2 100%   BMI 35.14 kg/m?  ? ?  ?Physical Exam ?Constitutional: Morbidly obese, sleeping ?   Comments: On BiPAP ? ?Cardiovascular:  ?   Rate and Rhythm: Normal rate and regular rhythm.  ?   Heart sounds:  ? ?Pulmonary:  ?   Effort: Pulmonary effort is normal on BiPAP ?   Comments:  ? ?Abdominal:  ?   Palpations: Abdomen is soft.  ?   Tenderness: Nondistended ? ?Musculoskeletal:     ?   General: No swelling or tenderness.  Right foot is bandaged ? ?Skin: ?   Comments: Left upper extremity AV F -nonswollen nontender ? ?Neurological:  ?   General: sleeping   ? ?Pertinent Microbiology ?Results for orders placed or performed during the hospital encounter of 02/02/22  ?Blood Culture (routine x 2)     Status: None (Preliminary result)  ? Collection Time: 02/02/22  1:50 AM  ? Specimen: BLOOD RIGHT ARM  ?Result Value Ref Range Status  ? Specimen Description BLOOD RIGHT ARM  Final  ? Special Requests   Final  ?  BOTTLES DRAWN AEROBIC AND ANAEROBIC Blood Culture adequate volume  ? Culture  Setup Time   Final  ?  ANAEROBIC BOTTLE ONLY ?GRAM POSITIVE COCCI IN DIPLOS ?Gram Stain Report Called to,Read Back By and Verified With: RACHEL  PUCKETT @ [id number] ON 02/04/22 C VARNER ?  ? Culture   Final  ?  NO GROWTH 2 DAYS ?Performed at [facility], [address] [place], [place] [address] ?  ? Report Status PENDING  Incomplete  ?Blood Culture (routine x 2)     Status: None (Preliminary result)  ? Collection Time: 02/02/22  1:52 AM  ? Specimen: BLOOD RIGHT HAND  ?Result Value Ref Range Status  ? Specimen Description BLOOD RIGHT HAND  Final  ? Special Requests   Final  ?  BOTTLES  DRAWN AEROBIC ONLY Blood Culture adequate volume  ? Culture   Final  ?  NO GROWTH 2 DAYS ?Performed at [facility], [address] [place], [place] [address] ?  ? Report Status PENDING  Incomplete  ?Resp Panel by RT-PCR (Flu A&B, Covid) Nasopharyngeal Swab     Status: None  ? Collection Time: 02/02/22  7:59 AM  ? Specimen: Nasopharyngeal Swab; Nasopharyngeal(NP) swabs in vial transport medium  ?Result Value Ref Range Status  ? SARS Coronavirus 2 by RT PCR NEGATIVE NEGATIVE Final  ?  Comment: (NOTE) ?SARS-CoV-2 target nucleic acids are NOT DETECTED. ? ?The SARS-CoV-2 RNA is generally detectable in upper respiratory ?specimens during the acute phase of infection. The lowest ?concentration of SARS-CoV-2 viral copies this assay can detect is ?138 copies/mL. A negative result does not preclude SARS-Cov-2 ?infection and should not be used as the sole basis for treatment or ?other patient management decisions. A negative result may occur with  ?improper specimen collection/handling, submission of specimen other ?than nasopharyngeal swab, presence of viral mutation(s) within the ?areas targeted by this assay, and inadequate number of viral ?copies(<138 copies/mL). A negative result must be combined with ?clinical observations, patient history, and epidemiological ?information. The expected result is Negative. ? ?Fact Sheet for Patients:  ?EntrepreneurPulse.com.au ? ?Fact Sheet for Healthcare Providers:  ?IncredibleEmployment.be ? ?This test is no t yet approved or cleared by the [place] FDA and  ?has been authorized for detection and/or diagnosis of SARS-CoV-2 by ?FDA under an Emergency Use Authorization (EUA). This EUA will remain  ?in effect (meaning this test can be used) for the duration of the ?COVID-19 declaration under Section 564(b)(1) of the Act, 21 ?U.S.C.section 360bbb-3(b)(1), unless the authorization is terminated  ?or revoked sooner.  ? ? ?  ? Influenza A by PCR  NEGATIVE NEGATIVE Final  ? Influenza B by PCR NEGATIVE NEGATIVE Final  ?  Comment: (NOTE) ?The Xpert Xpress SARS-CoV-2/FLU/RSV plus assay is intended as an aid ?in the diagnosis of influenza from Nasopharyngeal swab specimens and ?should not be used as a sole basis for treatment. Nasal washings and ?aspirates are unacceptable for Xpert Xpress SARS-CoV-2/FLU/RSV ?testing. ? ?Fact Sheet for Patients: ?EntrepreneurPulse.com.au ? ?Fact Sheet for Healthcare Providers: ?IncredibleEmployment.be ? ?This test is not yet approved or cleared by the [place] FDA and ?has been authorized for detection and/or diagnosis of SARS-CoV-2 by ?FDA under an Emergency Use Authorization (EUA). This EUA will remain ?in effect (meaning this test can be used) for the duration of the ?COVID-19 declaration under Section 564(b)(1) of the Act, 21 U.S.C. ?section 360bbb-3(b)(1), unless the authorization is terminated or ?revoked. ? ?Performed at [facility], [address] [place], [place] [address] ?  ?Surgical pcr screen     Status: Abnormal  ? Collection Time: 02/02/22 11:25 AM  ? Specimen: Nasal Mucosa; Nasal Swab  ?Result Value Ref Range Status  ? MRSA, PCR NEGATIVE NEGATIVE Final  ? Staphylococcus aureus POSITIVE (  A) NEGATIVE Final  ?  Comment: (NOTE) ?The Xpert SA Assay (FDA approved for NASAL specimens in patients 76 ?years of age and older), is one component of a comprehensive ?surveillance program. It is not intended to diagnose infection nor to ?guide or monitor treatment. ?Performed at [facility], [place] [address] [place], [place] ?[address] ?  ?Aerobic/Anaerobic Culture w Gram Stain (surgical/deep wound)     Status: None (Preliminary result)  ? Collection Time: 02/02/22 12:30 PM  ? Specimen: Foot, Right; Tissue  ?Result Value Ref Range Status  ? Specimen Description TISSUE  Final  ? Special Requests RESIDUAL FIFTH METATARSAL AMPUTATION SPEC B  Final  ? Gram Stain NO WBC SEEN ?RARE GRAM  NEGATIVE RODS ?  Final  ? Culture   Final  ?  RARE MORGANELLA MORGANII ?CULTURE REINCUBATED FOR BETTER GROWTH ?Performed at [facility], [place] [address] [place], [place] [address] ?  ? Report Status PENDING  In

## 2022-02-04 NOTE — Progress Notes (Signed)
PHARMACY - PHYSICIAN COMMUNICATION ?CRITICAL VALUE ALERT - BLOOD CULTURE IDENTIFICATION (BCID) ? ?[name] MEHRER Sr. is an 61 y.o. male who presented to [facility] on 02/02/2022 with a chief complaint of necro ? ?Assessment:  GNR - Enterobacterales spp ? ?Name of physician (or Provider) Contacted: Dr. [place] Bali ? ?Current antibiotics: Cefepime, Flagyl, Vancomycin ? ?Changes to prescribed antibiotics recommended:  ?No change. ? ?Results for orders placed or performed during the hospital encounter of 02/02/22  ?Blood Culture ID Panel (Reflexed) (Collected: 02/02/2022  1:50 AM)  ?Result Value Ref Range  ? Enterococcus faecalis NOT DETECTED NOT DETECTED  ? Enterococcus Faecium NOT DETECTED NOT DETECTED  ? Listeria monocytogenes NOT DETECTED NOT DETECTED  ? Staphylococcus species NOT DETECTED NOT DETECTED  ? Staphylococcus aureus (BCID) NOT DETECTED NOT DETECTED  ? Staphylococcus epidermidis NOT DETECTED NOT DETECTED  ? Staphylococcus lugdunensis NOT DETECTED NOT DETECTED  ? Streptococcus species NOT DETECTED NOT DETECTED  ? Streptococcus agalactiae NOT DETECTED NOT DETECTED  ? Streptococcus pneumoniae NOT DETECTED NOT DETECTED  ? Streptococcus pyogenes NOT DETECTED NOT DETECTED  ? A.calcoaceticus-baumannii NOT DETECTED NOT DETECTED  ? Bacteroides fragilis NOT DETECTED NOT DETECTED  ? Enterobacterales DETECTED (A) NOT DETECTED  ? Enterobacter cloacae complex NOT DETECTED NOT DETECTED  ? Escherichia coli NOT DETECTED NOT DETECTED  ? Klebsiella aerogenes NOT DETECTED NOT DETECTED  ? Klebsiella oxytoca NOT DETECTED NOT DETECTED  ? Klebsiella pneumoniae NOT DETECTED NOT DETECTED  ? Proteus species NOT DETECTED NOT DETECTED  ? Salmonella species NOT DETECTED NOT DETECTED  ? Serratia marcescens NOT DETECTED NOT DETECTED  ? Haemophilus influenzae NOT DETECTED NOT DETECTED  ? Neisseria meningitidis NOT DETECTED NOT DETECTED  ? Pseudomonas aeruginosa NOT DETECTED NOT DETECTED  ? Stenotrophomonas maltophilia NOT DETECTED NOT  DETECTED  ? Candida albicans NOT DETECTED NOT DETECTED  ? Candida auris NOT DETECTED NOT DETECTED  ? Candida glabrata NOT DETECTED NOT DETECTED  ? Candida krusei NOT DETECTED NOT DETECTED  ? Candida parapsilosis NOT DETECTED NOT DETECTED  ? Candida tropicalis NOT DETECTED NOT DETECTED  ? Cryptococcus neoformans/gattii NOT DETECTED NOT DETECTED  ? CTX-M ESBL NOT DETECTED NOT DETECTED  ? Carbapenem resistance IMP NOT DETECTED NOT DETECTED  ? Carbapenem resistance KPC NOT DETECTED NOT DETECTED  ? Carbapenem resistance NDM NOT DETECTED NOT DETECTED  ? Carbapenem resist OXA 48 LIKE NOT DETECTED NOT DETECTED  ? Carbapenem resistance VIM NOT DETECTED NOT DETECTED  ? ? ?Laurey Arrow, PharmD ?PGY1 Pharmacy Resident ?02/04/2022  10:49 AM ? ?Please check [email or web address] for unit-specific pharmacy phone numbers. ?

## 2022-02-04 NOTE — Progress Notes (Signed)
Subjective: ?[name] ROBAR Sr. is a 61 y.o. male patient seen at bedside, resting comfortably in no acute distress with neurology resident present.  Patient is s/p day #2  Right I&D with revision of fifth ray amputation site. Patient does not voice any concerns at this time with latency in speech.  Daughter is present bedside.  No other issues noted. ? ?Patient Active Problem List  ? Diagnosis Date Noted  ? Bacteremia   ? Hyponatremia 02/03/2022  ? Hyperkalemia 02/03/2022  ? High anion gap metabolic acidosis 79/39/0300  ? Anemia of chronic renal failure 02/03/2022  ? Necrotizing soft tissue infection   ? Diabetic foot infection ([place])   ? Osteomyelitis ([place]) 02/02/2022  ? Acute metabolic encephalopathy 92/33/0076  ? Severe sepsis ([place]) 02/02/2022  ? Acute respiratory failure with hypoxia ([place]) 02/02/2022  ? Protein-calorie malnutrition, mild ([place]) 02/02/2022  ? GERD (gastroesophageal reflux disease) 02/02/2022  ? Chronic systolic CHF (congestive heart failure) ([place]) 08/14/2019  ? LBBB (left bundle branch block) 08/13/2019  ? Problem with vascular access 08/13/2019  ? Dialysis AV fistula malfunction ([place]) 08/12/2019  ? ESRD (end stage renal disease) ([place]) 09/20/2018  ? Fever 09/20/2018  ? Nausea & vomiting 09/20/2018  ? DM2 (diabetes mellitus, type 2) ([place]) 09/20/2018  ? HTN (hypertension) 09/20/2018  ? ? ? ?Current Facility-Administered Medications:  ?  (feeding supplement) PROSource Plus liquid 30 mL, 30 mL, Oral, BID BM, Caren Griffins, MD, 30 mL at 02/04/22 2263 ?  0.9 %  sodium chloride infusion, 100 mL, Intravenous, PRN, Adelfa Koh, NP ?  0.9 %  sodium chloride infusion, 100 mL, Intravenous, PRN, Adelfa Koh, NP ?  acetaminophen (TYLENOL) tablet 650 mg, 650 mg, Oral, Q6H PRN, 650 mg at 02/02/22 1540 **OR** acetaminophen (TYLENOL) suppository 650 mg, 650 mg, Rectal, Q6H PRN, [name], MD ?  albuterol (PROVENTIL) (2.5 MG/3ML) 0.083% nebulizer solution 2.5 mg, 2.5 mg, Nebulization, Q2H  PRN, [name], MD ?  alteplase (CATHFLO ACTIVASE) injection 2 mg, 2 mg, Intracatheter, Once PRN, Adelfa Koh, NP ?  ascorbic acid (VITAMIN C) tablet 500 mg, 500 mg, Oral, Daily, Caren Griffins, MD, 500 mg at 02/04/22 3354 ?  ceFEPIme (MAXIPIME) 2 g in sodium chloride 0.9 % 100 mL IVPB, 2 g, Intravenous, Q M,W,F-2000, [name], MD, Stopped at 02/03/22 2010 ?  Chlorhexidine Gluconate Cloth 2 % PADS 6 each, 6 each, Topical, Q0600, Adelfa Koh, NP, 6 each at 02/03/22 [phone] ?  Chlorhexidine Gluconate Cloth 2 % PADS 6 each, 6 each, Topical, Q0600, Margaretha Seeds, MD, 6 each at 02/04/22 1100 ?  docusate sodium (COLACE) capsule 100 mg, 100 mg, Oral, BID PRN, [place] Crane M, PA-C ?  famotidine (PEPCID) tablet 20 mg, 20 mg, Oral, Daily, Caren Griffins, MD, 20 mg at 02/02/22 2126 ?  feeding supplement (NEPRO CARB STEADY) liquid 237 mL, 237 mL, Oral, BID BM, Gherghe, Costin M, MD, 237 mL at 02/04/22 1333 ?  heparin injection 1,000 Units, 1,000 Units, Dialysis, PRN, Adelfa Koh, NP ?  heparin injection 5,000 Units, 5,000 Units, Subcutaneous, Q8H, [name], MD, 5,000 Units at 02/04/22 1333 ?  insulin aspart (novoLOG) injection 0-20 Units, 0-20 Units, Subcutaneous, Q4H, Dela Newman Nickels, MD, 4 Units at 02/04/22 1712 ?  insulin glargine-yfgn (SEMGLEE) injection 30 Units, 30 Units, Subcutaneous, Daily, Caren Griffins, MD, 30 Units at 02/04/22 [id number] ?  lidocaine (PF) (XYLOCAINE) 1 % injection 5 mL, 5 mL, Intradermal, PRN, Adelfa Koh, NP ?  lidocaine-prilocaine (EMLA) cream 1 application., 1 application., Topical, PRN, Adelfa Koh, NP ?  metroNIDAZOLE (FLAGYL) tablet 500 mg, 500 mg, Oral, Q12H, Gherghe, Costin M, MD, 500 mg at 02/04/22 7564 ?  multivitamin (RENA-VIT) tablet 1 tablet, 1 tablet, Oral, QHS, Gherghe, Costin M, MD ?  mupirocin ointment (BACTROBAN) 2 % 1 application., 1 application., Nasal, BID, Margaretha Seeds, MD, 1 application. at  02/04/22 0839 ?  ondansetron (ZOFRAN) tablet 4 mg, 4 mg, Oral, Q6H PRN **OR** ondansetron (ZOFRAN) injection 4 mg, 4 mg, Intravenous, Q6H PRN, [name], MD ?  pantoprazole (PROTONIX) EC tablet 40 mg, 40 mg, Oral, Q1200, Caren Griffins, MD, 40 mg at 02/04/22 1138 ?  pentafluoroprop-tetrafluoroeth (GEBAUERS) aerosol 1 application., 1 application., Topical, PRN, Adelfa Koh, NP ?  polyethylene glycol (MIRALAX / GLYCOLAX) packet 17 g, 17 g, Oral, Daily PRN, [name], MD ?  sodium chloride 0.9 % bolus 250 mL, 250 mL, Intravenous, Once PRN, Shela Leff, MD ?  sucroferric oxyhydroxide (VELPHORO) chewable tablet 1,000 mg, 1,000 mg, Oral, TID WC, [name], MD, 1,000 mg at 02/04/22 1738 ?  thiamine (B-1) injection 100 mg, 100 mg, Intravenous, Daily, 100 mg at 02/03/22 0242 **OR** thiamine tablet 100 mg, 100 mg, Oral, Daily, Donnetta Simpers, MD, 100 mg at 02/04/22 3329 ?  vancomycin (VANCOCIN) IVPB 1000 mg/200 mL premix, 1,000 mg, Intravenous, Q M,W,F-HD, [name], MD, Paused at 02/03/22 1823 ?  zinc sulfate capsule 220 mg, 220 mg, Oral, BID, Caren Griffins, MD, 220 mg at 02/04/22 5188 ? ?Allergies  ?Allergen Reactions  ? Sulfa Antibiotics Nausea And Vomiting  ? ? ? ?Objective: ?Today's Vitals  ? 02/04/22 1551 02/04/22 1600 02/04/22 1700 02/04/22 1800  ?BP:  (!) 143/66 (!) 125/59 113/63  ?Pulse:  75 73 73  ?Resp:  17 15 (!) 30  ?Temp: 97.7 ?F (36.5 ?C)     ?TempSrc: Oral     ?SpO2:  100% 99% 99%  ?Weight:      ?Height:      ?PainSc:      ? ? ?General: No acute distress ? ?Right Lower extremity: Dressing to Right foot clean, dry, intact. No strikethrough noted, Upon removal of dressings there is a large wound at the dorsal lateral right foot that measures greater than 5 cm with exposed subcutaneous tissue tendon and bone at the proximal resection site of the revised fifth ray amputation, as previously noted, there is small areas of marginal necrosis and superior to the wound  there is a fluctuant blister that seems more enlarged since yesterday's evaluation using a sterile iris scissor the blister was lanced and there was significant odor and brown watery drainage from this area and active bleeding that is noted in the wound bed there localized edema and erythema at the dorsal lateral foot and ankle.  No pain to palpation to foot or calf. ? ? ? ?Assessment and Plan:  ?Problem List Items Addressed This Visit   ? ?  ? Genitourinary  ? ESRD (end stage renal disease) ([place]) - Primary  ?  Consult nephrology ?HD schedule MWF at [facility] ?Last dialysis was Friday, had full treatment according to family ?Vascular congestion on chest x-ray with 3 L nasal cannula oxygen requirement ?BUN 42, creatinine 11.09, potassium 3.5 ?Lactic acid initially elevated 3.1, repeat pending ?Continue Velphoro ?Continue to monitor ?Being [facility] at [facility] for potential renal transplant ?  ?  ? ?Other Visit Diagnoses   ? ? Delirium      ?  Right foot infection      ? Relevant Medications  ? ceFEPIme (MAXIPIME) 2 g in sodium chloride 0.9 % 100 mL IVPB (Completed)  ? metroNIDAZOLE (FLAGYL) IVPB 500 mg (Completed)  ? vancomycin (VANCOREADY) IVPB 2000 mg/400 mL (Completed)  ? vancomycin (VANCOCIN) IVPB 1000 mg/200 mL premix  ? ceFEPIme (MAXIPIME) 2 g in sodium chloride 0.9 % 100 mL IVPB  ? mupirocin ointment (BACTROBAN) 2 % 1 application.  ? metroNIDAZOLE (FLAGYL) tablet 500 mg  ? ?  ? ? ?-Patient seen and evaluated at bedside ?-Extent of right foot marginal necrosis and fluctuant blister with malodor at the superior aspect of the wound discussed with patient's daughter.  While bedside today using a sterile procedure the blister was lanced brown watery drainage was noted with significant malodor the area was then dressed saline wet-to-dry dressing to right foot ?-Orders are in place for nursing to assist with dressing changes daily as well ?-Discussed with daughter at this time we will hold off on any surgery  planning until his mental status and encephalopathy improves; patient would benefit from a repeat debridement and possible placement of wound VAC however at this time we will await medical improvement ?-Contin

## 2022-02-04 NOTE — Progress Notes (Addendum)
?PROGRESS NOTE ? ?[name] Dove Dowlen Sr. CMK:[id number] DOB: Feb 05, 1961 DOA: 02/02/2022 ?PCP: [facility] ? ? LOS: 2 days  ? ?Brief Narrative / Interim history: ?61 y/o male history of DM, ESRD on HD, admitted to the hospital with fever, acute encephalopathy, sepsis and right foot infection.  He has a history of prior right fifth metatarsal amputation performed many years ago in [place].  Plain films of his foot indicate swelling and subcutaneous gas without any obvious bony erosion. He initially presented to [facility] and subsequently transferred to [facility].  Podiatry consulted, he was taken emergently to the [facility] on 3/19.  Postoperatively he has had persistent lethargy and was taken to the ICU 3/19 evening.  He was placed on Narcan drip for presumed lethargy due to opioid use however he only got 5 mg of oxycodone.  Narcan drip was discontinued on 3/20 and he was transferred out of the ICU ? ?Subjective / 24h Interval events: ?Remains somewhat less interactive than expected ? ?Assesement and Plan: ?Principal Problem: ?  Osteomyelitis ([place]) ?Active Problems: ?  ESRD (end stage renal disease) ([place]) ?  DM2 (diabetes mellitus, type 2) ([place]) ?  HTN (hypertension) ?  Chronic systolic CHF (congestive heart failure) ([place]) ?  Acute metabolic encephalopathy ?  Severe sepsis ([place]) ?  Acute respiratory failure with hypoxia ([place]) ?  Protein-calorie malnutrition, mild ([place]) ?  GERD (gastroesophageal reflux disease) ?  Hyponatremia ?  Hyperkalemia ?  High anion gap metabolic acidosis ?  Anemia of chronic renal failure ?  Necrotizing soft tissue infection ?  Diabetic foot infection ([place]) ? ? ?Addendum 4 pm:  ? ?Acute CVA - MRI resulted this afternoon with numerous punctate acute infarctions scattered throughout both ?cerebral hemispheres. Neurology consulted, d/w Dr Theda Sers who will see in consultation.  ? ?Assessment and Plan: ?Principal problem ?Severe sepsis due to right foot osteomyelitis / cellulitis -patient  met criteria for sepsis with elevated lactic acid, confusion, high fever, tachycardia, elevated WBC and a source.  He was placed on broad-spectrum antibiotics with vancomycin, cefepime, Flagyl, continue.  Podiatry consulted and he was taken to the [facility] on 3/19 and he is status post right foot I&D and revision of the fifth ray amputation. Vascular surgery evaluated patient in the past, as an outpatient, with no vascular intervention warranted.  Intraoperative cultures show abundant gram variable rods, gram-positive cocci, Morganella morganii and Enterococcus faecalis.  Blood cultures are showing 1/4 bottles gram-positive cocci in diplos.  ID following, currently on broad-spectrum antibiotics as above ? ?Active problems ?Acute metabolic encephalopathy-likely in the setting of severe sepsis, high fever.  He definitely is improved with antibiotics and surgery, but still somewhat withdrawn and less interactive than I would expect.  Obtain an MRI of the brain ? ?ESRD-nephrology consulted, underwent dialysis 3/20 ? ?Acute hypoxic respiratory failure-possibly due to #1 and acute on chronic CHF, chest x-ray on admission with mild cardiomegaly, wean off to room air as tolerated ? ?Acute on chronic systolic CHF-most recent echo July 2022 shows an EF of 45%.  Volume management per dialysis, euvolemic this morning ? ?GERD (gastroesophageal reflux disease) -Continue Protonix ? ?Hyponatremia-due to fluid overload, dialysis today ? ?Hyperkalemia-potassium to be managed with dialysis today ? ?Anion gap metabolic acidosis-in the setting of sepsis/ESRD ? ?Anemia of chronic renal disease -no bleeding, monitor hemoglobin ? ?DM2 (diabetes mellitus, type 2), poorly controlled, with hyperglycemia-Patient takes [place] and NovoLog at home.  Continue sliding scale every 4 until eating better.  CBGs overall  improving ? ?CBG (last 3)  ?Recent Labs  ?  02/03/22 ?2033 02/04/22 ?6629 02/04/22 ?4765  ?GLUCAP 226* 223* 197*  ? ? ? ?Scheduled Meds: ?  (feeding supplement) PROSource Plus  30 mL Oral BID BM  ? vitamin C  500 mg Oral Daily  ? Chlorhexidine Gluconate Cloth  6 each Topical Q0600  ? Chlorhexidine Gluconate Cloth  6 each Topical Q0600  ? famotidine  20 mg Oral Daily  ? feeding supplement (NEPRO CARB STEADY)  237 mL Oral BID BM  ? heparin  5,000 Units Subcutaneous Q8H  ? insulin aspart  0-20 Units Subcutaneous Q4H  ? insulin glargine-yfgn  30 Units Subcutaneous Daily  ? metroNIDAZOLE  500 mg Oral Q12H  ? multivitamin  1 tablet Oral QHS  ? mupirocin ointment  1 application. Nasal BID  ? pantoprazole  40 mg Oral Q1200  ? sucroferric oxyhydroxide  1,000 mg Oral TID WC  ? thiamine injection  100 mg Intravenous Daily  ? Or  ? thiamine  100 mg Oral Daily  ? zinc sulfate  220 mg Oral BID  ? ?Continuous Infusions: ? sodium chloride    ? sodium chloride    ? ceFEPime (MAXIPIME) IV Stopped (02/03/22 2010)  ? lactated ringers    ? sodium chloride    ? vancomycin Stopped (02/03/22 1823)  ? ?PRN Meds:.sodium chloride, sodium chloride, acetaminophen **OR** acetaminophen, albuterol, alteplase, docusate sodium, heparin, lidocaine (PF), lidocaine-prilocaine, ondansetron **OR** ondansetron (ZOFRAN) IV, pentafluoroprop-tetrafluoroeth, polyethylene glycol, sodium chloride ? ?Diet Orders (From admission, onward)  ? ?  Start     Ordered  ? 02/03/22 1102  Diet Carb Modified Fluid consistency: Thin; Room service appropriate? Yes  Diet effective now       ?Question Answer Comment  ?Diet-HS Snack? Nothing   ?Calorie Level Medium 1600-2000   ?Fluid consistency: Thin   ?Room service appropriate? Yes   ?  ? 02/03/22 1101  ? ?  ?  ? ?  ? ? ?DVT prophylaxis: heparin injection 5,000 Units Start: 02/02/22 0730 ?SCDs Start: 02/02/22 0716 ? ? ?Lab Results  ?Component Value Date  ? PLT 176 02/04/2022  ? ? ?  Code Status: Full Code ? ?Family Communication: Daughter present at bedside ? ?Status is: Inpatient ? ?Remains inpatient appropriate because: Severity of illness, confusion ? ? ?Level  of care: Progressive ? ?Consultants:  ?PCCM ?Nephrology ?Podiatry ?Infectious disease ? ?Procedures:  ?I&D right foot 3/19 ? ?Microbiology  ?Surgery cultures-Morganella Morgagni, Enterococcus faecalis,  ?Blood cultures 1/4 bottles-[facility] ? ?Antimicrobials: ?Vancomycin, cefepime, metronidazole 3/19 >> ? ? ?Objective: ?Vitals:  ? 02/04/22 0630 02/04/22 0700 02/04/22 0714 02/04/22 0805  ?BP: (!) 103/55 (!) 97/52    ?Pulse: 69 68  70  ?Resp: (!) 25 (!) 24  19  ?Temp:   98.6 ?F (37 ?C)   ?TempSrc:   Axillary   ?SpO2: 100% 100%  100%  ?Weight:      ?Height:      ? ? ?Intake/Output Summary (Last 24 hours) at 02/04/2022 0904 ?Last data filed at 02/04/2022 4650 ?Gross per 24 hour  ?Intake 788.26 ml  ?Output 1933 ml  ?Net -1144.74 ml  ? ? ?Wt Readings from Last 3 Encounters:  ?02/04/22 120.8 kg  ?01/29/22 121.1 kg  ?12/06/21 120 kg  ? ? ?Examination: ? ?Constitutional: NAD ?Eyes: lids and conjunctivae normal, no scleral icterus ?ENMT: mmm ?Neck: normal, supple ?Respiratory: clear to auscultation bilaterally, no wheezing, no crackles. Normal respiratory effort.  ?Cardiovascular: Regular rate and rhythm, no murmurs /  rubs / gallops. No LE edema. ?Abdomen: soft, no distention, no tenderness. Bowel sounds positive.  ?Skin: no rashes ?Neurologic: no focal deficits, equal strength ? ? ?Data Reviewed: I have independently reviewed following labs and imaging studies  ? ?CBC ?Recent Labs  ?Lab 02/02/22 ?0107 02/02/22 ?0920 02/03/22 ?0037 02/03/22 ?0408 02/04/22 ?0217  ?WBC 12.0* 12.4* 13.6* 11.6* 11.0*  ?HGB 12.0* 11.8* 12.3* 11.6* 11.3*  ?HCT 37.1* 37.0* 38.8* 36.2* 33.7*  ?PLT 171 155 160 150 176  ?MCV 95.9 96.1 95.3 94.3 92.3  ?MCH 31.0 30.6 30.2 30.2 31.0  ?MCHC 32.3 31.9 31.7 32.0 33.5  ?RDW 15.9* 16.1* 16.0* 16.2* 15.9*  ?LYMPHSABS 0.9  --   --   --   --   ?MONOABS 1.9*  --   --   --   --   ?EOSABS 0.0  --   --   --   --   ?BASOSABS 0.1  --   --   --   --   ? ? ? ?Recent Labs  ?Lab 02/02/22 ?0107 02/02/22 ?0329 02/02/22 ?0604  02/02/22 ?0604 02/02/22 ?1618 02/03/22 ?0037 02/03/22 ?0408 02/04/22 ?0217  ?NA 129*  --  131*  --   --  131*  130* 128* 132*  ?K 3.5  --  3.6  --   --  5.5*  5.3* 6.1* 3.7  ?CL 87*  --  90*  --   --  92*

## 2022-02-04 NOTE — Consult Note (Signed)
NEUROLOGY CONSULTATION NOTE  ? ?Date of service: February 04, 2022 ?Patient Name: [name] EVETTS Sr. ?MRN:  [id number] ?DOB:  [birth year] ?Reason for consult: "encephalopathy, strokes" ?Requesting Provider: Caren Griffins, MD ? ?History of Present Illness  ?[name] Cruel Sr. is a 61 y.o. male with a past medical history of ESRD, HFmrEF, and T2DM.  He initially presented to the hospital with encephalopathy and was found to have osteomyelitis of his foot.  He was taken emergently to the OR on 3/19.  After fifth ray amputation he exhibited persistent lethargy and was moved to the ICU on 3/19 and was started on Narcan drip due to concern for opioid overdose.  Per the medical record the patient only received 5 mg of immediate release oxycodone.  The patient became alert on 3/20 and care was transferred to the hospitalist service.  ? ?On assessment this afternoon, the patient is seen in the ICU with his daughter at the bedside.  The patient is alert but responds to questions with grunts.  After coaxing from his daughter, he attempts to answer questions.  He exhibits significant speech latency: when asked what year it is, he is able to state that it is 2023 but takes 10 seconds to answer this question.  He is unable to state what month it is or where he is.  His attention is not intact.  He is unable to follow multistep commands such as "stick your tongue out and move it side-to-side".  He is unable to answer simple math questions such as "what is 10+10?".  Per daughter, the patient has a normal mental baseline. ?  ?ROS  ? ?Unable to assess 2/2 encephalopathy ? ?Past History  ? ?Past Medical History:  ?Diagnosis Date  ? CHF (congestive heart failure) ([place])   ? Diabetes mellitus without complication ([place])   ? type 2  ? ESRD on hemodialysis ([place])   ? mon wed fri  ? Hypertension   ? Kidney failure   ? Myocardial infarction [facility])   ? Nonischemic cardiomyopathy ([place])   ? ?Past Surgical History:  ?Procedure Laterality Date  ? A/V  FISTULAGRAM N/A 08/15/2019  ? Procedure: A/V FISTULAGRAM;  Surgeon: [name], MD;  Location: [place] [facility];  Service: Cardiovascular;  Laterality: N/A;  ? AMPUTATION Right 02/02/2022  ? Procedure: AMPUTATION 5th RAY;  Surgeon: [name], MD;  Location: [place];  Service: Podiatry;  Laterality: Right;  ? AV FISTULA PLACEMENT    ? CARDIAC CATHETERIZATION  02/17/2018  ? LHC 02/17/18 ([name]): 30% pLAD, 30% dRCA, LVEDP 25-30, LVEF 25%.    ? CATARACT EXTRACTION W/PHACO Right 03/29/2020  ? Procedure: CATARACT EXTRACTION PHACO AND INTRAOCULAR LENS PLACEMENT ([place])  RIGHT DIABETIC;  Surgeon: [name], MD;  Location: [facility];  Service: Ophthalmology;  Laterality: Right;  Lot # [id number] H ?[place]: 02:30.9 ?AP% 9.5% ?CDE: 14.38  ? EYE SURGERY Right   ? cataract removed  ? I & D EXTREMITY Right 02/02/2022  ? Procedure: IRRIGATION AND DEBRIDEMENT RIGHT FOOT;  Surgeon: [name], MD;  Location: [place];  Service: Podiatry;  Laterality: Right;  ? INSERTION OF DIALYSIS CATHETER  08/13/2019  ? Procedure: Insertion Of Dialysis Catheter;  Surgeon: [name], MD;  Location: [facility] OR;  Service: Vascular;;  ? INSERTION OF DIALYSIS CATHETER Left 07/19/2020  ? Procedure: INSERTION OF DIALYSIS CATHETER;  Surgeon: [name], MD;  Location: [place];  Service: Vascular;  Laterality: Left;  ? IRRIGATION AND DEBRIDEMENT ABSCESS    ?  PERIPHERAL VASCULAR BALLOON ANGIOPLASTY  08/15/2019  ? Procedure: PERIPHERAL VASCULAR BALLOON ANGIOPLASTY;  Surgeon: [name], MD;  Location: [place] [facility];  Service: Cardiovascular;;  ? REVISON OF ARTERIOVENOUS FISTULA Left 07/19/2020  ? Procedure: REVISON/PLICATION OF ARTERIOVENOUS FISTULA LEFT;  Surgeon: [name], MD;  Location: [place];  Service: Vascular;  Laterality: Left;  ? THROMBECTOMY AND REVISION OF ARTERIOVENTOUS (AV) GORETEX  GRAFT Left 08/13/2019  ? Procedure: THROMBECTOMY OF LEFT ARM ARTERIOVENTOUS (AV) FISTULA;   Surgeon: [name], MD;  Location: [place];  Service: Vascular;  Laterality: Left;  ? TOE AMPUTATION    ? ?Family History  ?Problem Relation Age of Onset  ? Hypertension Mother   ? ?Social History  ? ?Socioeconomic History  ? Marital status: Divorced  ?  Spouse name: Not on file  ? Number of children: Not on file  ? Years of education: Not on file  ? Highest education level: Not on file  ?Occupational History  ? Not on file  ?Tobacco Use  ? Smoking status: Never  ? Smokeless tobacco: Never  ?Vaping Use  ? Vaping Use: Never used  ?Substance and Sexual Activity  ? Alcohol use: Not Currently  ? Drug use: Never  ? Sexual activity: Not on file  ?Other Topics Concern  ? Not on file  ?Social History Narrative  ? Not on file  ? ?Social Determinants of Health  ? ?Financial Resource Strain: Not on file  ?Food Insecurity: Not on file  ?Transportation Needs: Not on file  ?Physical Activity: Not on file  ?Stress: Not on file  ?Social Connections: Not on file  ? ?Allergies  ?Allergen Reactions  ? Sulfa Antibiotics Nausea And Vomiting  ? ? ?Medications  ? ?Medications Prior to Admission  ?Medication Sig Dispense Refill Last Dose  ? lidocaine-prilocaine (EMLA) cream Apply 1 application topically every Monday, Wednesday, and Friday with hemodialysis.   3 Past Week  ? sucroferric oxyhydroxide (VELPHORO) 500 MG chewable tablet Chew 1,000 mg by mouth 3 (three) times daily with meals.   02/01/2022  ? TRESIBA FLEXTOUCH 100 UNIT/ML FlexTouch Pen Inject 30 Units into the skin daily.   3.17.23  ? famotidine (PEPCID) 20 MG tablet Take 1 tablet (20 mg total) by mouth at bedtime. (Patient not taking: Reported on 02/02/2022)   Not Taking  ?  ? ?Vitals  ? ?Vitals:  ? 02/04/22 1551 02/04/22 1600 02/04/22 1700 02/04/22 1800  ?BP:  (!) 143/66 (!) 125/59 113/63  ?Pulse:  75 73 73  ?Resp:  17 15 (!) 30  ?Temp: 97.7 ?F (36.5 ?C)     ?TempSrc: Oral     ?SpO2:  100% 99% 99%  ?Weight:      ?Height:      ?  ? ?Body mass index is 35.14 kg/m?. ? ?Physical  Exam  ?General: Lying comfortably in bed; in no acute distress.  ?HENT: Normal oropharynx and mucosa. Normal external appearance of ears and nose.  ?Neck: Supple, no pain or tenderness  ?CV: No peripheral edema.  ?Pulmonary: Symmetric Chest rise. Normal respiratory effort.  ?Ext: No cyanosis, edema, or deformity  ?Skin: No rash. Normal palpation of skin.   ?  ?  ?Neurologic Examination  ?Mental status/Cognition: Alert, oriented to year but unable to name the month ?Speech/language: Significant latency, paucity of speech ?Cranial nerves:  ? CN II Pupils equal and reactive to light, no VF deficits   ? CN III,IV,VI EOM intact, no gaze preference or deviation, no nystagmus   ?  CN V normal sensation in V1, V2, and V3 segments bilaterally   ? CN VII no asymmetry, no nasolabial fold flattening   ? CN VIII normal hearing to speech   ? CN IX & X Unable to assess 2/2 encephalopathy  ? CN XI 5/5 head turn and 5/5 shoulder shrug bilaterally   ? CN XII Unable to assess 2/2 encephalopathy  ?  ?Motor:  ?Mvmt Root Nerve  Muscle Right Left Comments  ?SA C5/6 Ax Deltoid 5/5 5/5    ?EF C5/6 Mc Biceps 5/5 5/5    ?EE C6/7/8 Rad Triceps 5/5 5/5    ?WF C6/7 Med FCR        ?WE C7/8 PIN ECU        ?F Ab C8/T1 U ADM/FDI        ?HF L1/2/3 Fem Illopsoas 2/5 2/5    ?KE L2/3/4 Fem Quad 2/5 2/5    ?DF L4/5 D Peron Tib Ant        ?PF S1/2 Tibial Grc/Sol        ?  ?Reflexes: ?  Right Left Comments  ?Pectoralis        ? Biceps (C5/6)      ?Brachioradialis (C5/6)      ? Triceps (C6/7)      ? Patellar (L3/4) 3+ 3+    ? Achilles (S1)      ? Hoffman        ? Plantar        ?Jaw jerk     ?  ?Sensation: ? Light touch intact  ? Pin prick    ? Temperature    ? Vibration    ?Proprioception    ?  ?Coordination/Complex Motor:  ?Unable to assess 2/2 encephalopathy ? ? ?Labs  ? ?CBC:  ?Recent Labs  ?Lab 02/02/22 ?0107 02/02/22 ?0920 02/03/22 ?0408 02/04/22 ?0217  ?WBC 12.0*   < > 11.6* 11.0*  ?NEUTROABS 9.1*  --   --   --   ?HGB 12.0*   < > 11.6* 11.3*  ?HCT  37.1*   < > 36.2* 33.7*  ?MCV 95.9   < > 94.3 92.3  ?PLT 171   < > 150 176  ? < > = values in this interval not displayed.  ? ? ?Basic Metabolic Panel:  ?Lab Results  ?Component Value Date  ? NA 132 (L) 03/21

## 2022-02-04 NOTE — Evaluation (Signed)
Occupational Therapy Evaluation Patient Details Name: [name] [name]. MRN: [id number] DOB: Oct 29, 1961 Today's Date: 02/04/2022   History of Present Illness 61 y.o. male  presents the ED 02/02/22 with altered mental status. Pt found to have osteomyelitis of R foot, sepsis and acute respiratory failure with hypoxia with acute metabolic encephalopathy. 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 Transferred to ICU after LoC and hypotension systolic in 80s placed on 5L O2 and given Narcan.PMH: diabetes mellitus type 2, CHF, ESRD, hypertension, myocardial infarction, nonischemic cardiomyopathy   Clinical Impression   PTA, pt lives with brother and typically Independent with ADLs/mobility. Pt presents now with diagnoses above and deficits in cognition, strength, balance and endurance. Pt with slow processing, inconsistently responding to questions and inconsistent command following throughout. Pt requires Min A for UB ADLs and overall Max A x 2 for LB ADLs. Despite education on NWB R LE precautions, pt unable to/did not attempt to maintain NWB precautions in standing so deferred further standing attempts to maintain wound integrity. Pt may benefit from sliding board vs scoot transfer training in next sessions. Based on deficits and below baseline, recommend SNF rehab at [place].  VSS on RA though pt does endorse dizziness with movement      Recommendations for follow up therapy are one component of a multi-disciplinary discharge planning process, led by the attending physician.  Recommendations may be updated based on patient status, additional functional criteria and insurance authorization.   Follow Up Recommendations  Skilled nursing-short term rehab (<3 hours/day)    Assistance Recommended at Discharge Frequent or constant Supervision/Assistance  Patient can return home with the following Two people to help with walking and/or transfers;Two people to help with bathing/dressing/bathroom    Functional  Status Assessment  Patient has had a recent decline in their functional status and demonstrates the ability to make significant improvements in function in a reasonable and predictable amount of time.  Equipment Recommendations  Other (comment) (TBD pending progress; may need wheelchair pending ability to maintain NWB precautions)    Recommendations for Other Services       Precautions / Restrictions Precautions Precautions: Fall Restrictions Weight Bearing Restrictions: Yes RLE Weight Bearing: Non weight bearing      Mobility Bed Mobility Overal bed mobility: Needs Assistance Bed Mobility: Rolling, Supine to Sit, Sit to Supine Rolling: Max assist, +2 for physical assistance, +2 for safety/equipment   Supine to sit: Mod assist, +2 for physical assistance, HOB elevated, +2 for safety/equipment Sit to supine: Mod assist, +2 for physical assistance, +2 for safety/equipment, HOB elevated   General bed mobility comments: able to bring LE to EOB partially with assist to lift trunk. able to lay trunk back to bed but required assist to get BLE back up. Max A x 2 to roll with poor motor planning/initiation    Transfers Overall transfer level: Needs assistance Equipment used: Standard walker (only able to locate standard walker on unit. No RW in room) Transfers: Sit to/from Stand Sit to Stand: Mod assist, +2 physical assistance, +2 safety/equipment, From elevated surface           General transfer comment: able to stand and clear hips but unable to maintain NWB to R foot despite 2 attempts and placing pt foot on top of therapist's, having R LE kicked out in front, etc      Balance Overall balance assessment: Needs assistance Sitting-balance support: No upper extremity supported, Feet supported Sitting balance-Leahy Scale: Fair     Standing balance  support: Bilateral upper extremity supported, During functional activity Standing balance-Leahy Scale: Poor                              ADL either performed or assessed with clinical judgement   ADL Overall ADL's : Needs assistance/impaired Eating/Feeding: Set up;Bed level Eating/Feeding Details (indicate cue type and reason): able to reach for water cup and bring to mouth Grooming: Minimal assistance;Sitting   Upper Body Bathing: Minimal assistance;Sitting   Lower Body Bathing: Maximal assistance;+2 for safety/equipment;+2 for physical assistance;Sitting/lateral leans;Bed level   Upper Body Dressing : Minimal assistance;Sitting   Lower Body Dressing: Maximal assistance;+2 for physical assistance;+2 for safety/equipment;Sitting/lateral leans;Bed level       Toileting- Clothing Manipulation and Hygiene: Total assistance;Bed level;+2 for physical assistance;+2 for safety/equipment         General ADL Comments: able to stand but unable to actually maintain NWB to R LE (appeared pt was not even attempting this) so did not progress OOB. Very slow processing and inconsistent command following     Vision Ability to See in Adequate Light: 0 Adequate Patient Visual Report: No change from baseline Vision Assessment?: No apparent visual deficits     Perception     Praxis      Pertinent Vitals/Pain       Hand Dominance Right   Extremity/Trunk Assessment Upper Extremity Assessment Upper Extremity Assessment: Overall WFL for tasks assessed   Lower Extremity Assessment Lower Extremity Assessment: Defer to PT evaluation   Cervical / Trunk Assessment Cervical / Trunk Assessment: Normal   Communication Communication Communication: Expressive difficulties;Other (comment) (slow processing; inconsistent responses)   Cognition Arousal/Alertness: Awake/alert Behavior During Therapy: Flat affect Overall Cognitive Status: No family/caregiver present to determine baseline cognitive functioning Area of Impairment: Orientation, Attention, Memory, Following commands, Safety/judgement, Awareness, Problem  solving                 Orientation Level: Disoriented to, Time, Situation Current Attention Level: Sustained Memory: Decreased short-term memory Following Commands: Follows one step commands inconsistently, Follows one step commands with increased time Safety/Judgement: Decreased awareness of safety, Decreased awareness of deficits Awareness: Intellectual Problem Solving: Slow processing, Decreased initiation, Difficulty sequencing, Requires verbal cues, Requires tactile cues General Comments: very slow processing, inconsistent command following. intermittent answering of questions (would not answer orientation questions). Very poor awareness, unable to recall or implement NWB precautions effectively     General Comments  VSS on RA; does endorse dizziness with movement. pt with a lot of difficult using room phone despite max cues and education    Exercises     Shoulder Instructions      Home Living Family/patient expects to be discharged to:: Private residence Living Arrangements: Other (Comment) (brother) Available Help at Discharge: Available PRN/intermittently;Family Type of Home: House Home Access: Stairs to enter Secretary/administrator of Steps: 4 Entrance Stairs-Rails: None Home Layout: One level     Bathroom Shower/Tub: Producer, television/film/video: Standard Bathroom Accessibility: No   Home Equipment: None   Additional Comments: daughter confirmed and filled in gaps in pt report of home setup and PLOF - daughter unavailable on OT eval      Prior Functioning/Environment Prior Level of Function : Needs assist       Physical Assist : ADLs (physical)     Mobility Comments: independent with household mobility, gets transportation to dialysis ADLs Comments: independent with ADLs, brother provides for iADLs  OT Problem List: Decreased strength;Decreased activity tolerance;Impaired balance (sitting and/or standing);Decreased cognition;Decreased  safety awareness;Decreased knowledge of use of DME or AE;Decreased knowledge of precautions      OT Treatment/Interventions: Self-care/ADL training;Therapeutic exercise;Energy conservation;DME and/or AE instruction;Therapeutic activities;Patient/family education    OT Goals(Current goals can be found in the care plan section) Acute Rehab OT Goals Patient Stated Goal: call son OT Goal Formulation: With patient Time For Goal Achievement: 02/18/22 Potential to Achieve Goals: Good  OT Frequency: Min 2X/week    Co-evaluation              AM-PAC OT "6 Clicks" Daily Activity     Outcome Measure Help from another person eating meals?: A Little Help from another person taking care of personal grooming?: A Little Help from another person toileting, which includes using toliet, bedpan, or urinal?: Total Help from another person bathing (including washing, rinsing, drying)?: A Lot Help from another person to put on and taking off regular upper body clothing?: A Little Help from another person to put on and taking off regular lower body clothing?: A Lot 6 Click Score: 14   End of Session Equipment Utilized During Treatment: Gait belt;Standard Office manager Communication: Mobility status  Activity Tolerance: Other (comment) (limited by cognition) Patient left: in bed;with call bell/phone within reach;with bed alarm set  OT Visit Diagnosis: Unsteadiness on feet (R26.81);Other abnormalities of gait and mobility (R26.89);Muscle weakness (generalized) (M62.81);Other symptoms and signs involving cognitive function                Time: 5366-4403 OT Time Calculation (min): 39 min Charges:  OT General Charges $OT Visit: 1 Visit OT Evaluation $OT Eval Moderate Complexity: 1 Mod OT Treatments $Self Care/Home Management : 8-22 mins $Therapeutic Activity: 8-22 mins  Bradd Canary, OTR/L Acute Rehab Services Office: [phone]   Lorre Munroe 02/04/2022, 12:06 PM

## 2022-02-04 NOTE — Progress Notes (Addendum)
[name] [name] ?Progress Note ? ?Subjective: seen in ICU, more responsive today, still sluggish and some memory issues ? ?Vitals:  ? 02/04/22 1237 02/04/22 1300 02/04/22 1400 02/04/22 1551  ?BP:  111/65 99/60   ?Pulse: 74 71 72   ?Resp: 18 11 (!) 32   ?Temp: 98.3 ?F (36.8 ?C)   97.7 ?F (36.5 ?C)  ?TempSrc: Oral   Oral  ?SpO2: 100% 100% 100%   ?Weight:      ?Height:      ? ? ?Exam: ?General: Lehargic; NAD; Responds to voice ?Head: NCAT sclera not icteric ?Lungs: CTA clear anterior. Diminished bilaterally. No wheeze, rales or rhonchi. Breathing is unlabored. ?Heart: RRR. No murmur, rubs or gallops.  ?Abdomen: soft, nontender, +BS ?Lower extremities: no edema BL LE; R foot wrapped with ACE bandage ?Neuro: as above, moves all extremities spontaneously. ?Dialysis Access: L AVF (+) B/T ?  ?Dialysis Orders:  MWF [facility] ?4h 71min  450/500    117.8kg   2/ 2.5Ca bath  Hep 6000  L AVF ? - on 3/19 > hep B Ag neg, and Hep B Ab's high/ protective ? - calcitriol 1.22mcg PO qHD-last 01/31/22 ?  ?Assessment/Plan: ?Osteomyelitis-Podiatry following, on IV vanc/cefepime/Flagyl; S/p R foot I&D and revision 5th ray amputation on 3/19 by podiatry.  ?ESRD - on HD MWF. HD tomorrow upstairs.  ?Hypertension/volume  - Blood pressures acceptable; euvolemic on exam ?Anemia of CKD - Hgb 11.8; No ESA/Fe needs at this time ?Secondary Hyperparathyroidism -  CCa okay. Continue VDRA with HD. Phos in range lower part.  ?Nutrition - Advance to renal diet with fluid restriction when more awake ? ? ?Rob Doctor, hospital ?02/04/2022, 4:12 PM ? ? ?Recent Labs  ?Lab 02/03/22 ?0037 02/03/22 ?0408 02/04/22 ?0217  ?HGB 12.3* 11.6* 11.3*  ?ALBUMIN 2.6*  2.7*  --  2.2*  ?CALCIUM 9.0  8.9 8.7* 8.5*  ?PHOS 3.1 3.4  --   ?CREATININE 13.10*  13.19* 13.14* 9.58*  ?K 5.5*  5.3* 6.1* 3.7  ? ? ?Inpatient medications: ? (feeding supplement) PROSource Plus  30 mL Oral BID BM  ? vitamin C  500 mg Oral Daily  ? Chlorhexidine Gluconate Cloth  6 each Topical Q0600  ?  Chlorhexidine Gluconate Cloth  6 each Topical Q0600  ? famotidine  20 mg Oral Daily  ? feeding supplement (NEPRO CARB STEADY)  237 mL Oral BID BM  ? heparin  5,000 Units Subcutaneous Q8H  ? insulin aspart  0-20 Units Subcutaneous Q4H  ? insulin glargine-yfgn  30 Units Subcutaneous Daily  ? metroNIDAZOLE  500 mg Oral Q12H  ? multivitamin  1 tablet Oral QHS  ? mupirocin ointment  1 application. Nasal BID  ? pantoprazole  40 mg Oral Q1200  ? sucroferric oxyhydroxide  1,000 mg Oral TID WC  ? thiamine injection  100 mg Intravenous Daily  ? Or  ? thiamine  100 mg Oral Daily  ? zinc sulfate  220 mg Oral BID  ? ? sodium chloride    ? sodium chloride    ? ceFEPime (MAXIPIME) IV Stopped (02/03/22 2010)  ? lactated ringers    ? sodium chloride    ? vancomycin Stopped (02/03/22 1823)  ? ?sodium chloride, sodium chloride, acetaminophen **OR** acetaminophen, albuterol, alteplase, docusate sodium, heparin, lidocaine (PF), lidocaine-prilocaine, ondansetron **OR** ondansetron (ZOFRAN) IV, pentafluoroprop-tetrafluoroeth, polyethylene glycol, sodium chloride ? ? ? ? ? ? ?

## 2022-02-04 NOTE — Progress Notes (Signed)
RT went to place pt on cpap, pt wanted RT to come back at a later time. ?

## 2022-02-05 ENCOUNTER — Inpatient Hospital Stay (HOSPITAL_COMMUNITY): Payer: Medicare Other

## 2022-02-05 ENCOUNTER — Other Ambulatory Visit (HOSPITAL_COMMUNITY): Payer: Medicare Other

## 2022-02-05 DIAGNOSIS — E11628 Type 2 diabetes mellitus with other skin complications: Secondary | ICD-10-CM | POA: Diagnosis not present

## 2022-02-05 DIAGNOSIS — I6340 Cerebral infarction due to embolism of unspecified cerebral artery: Secondary | ICD-10-CM

## 2022-02-05 DIAGNOSIS — Z992 Dependence on renal dialysis: Secondary | ICD-10-CM | POA: Diagnosis not present

## 2022-02-05 DIAGNOSIS — I639 Cerebral infarction, unspecified: Secondary | ICD-10-CM

## 2022-02-05 DIAGNOSIS — Z8673 Personal history of transient ischemic attack (TIA), and cerebral infarction without residual deficits: Secondary | ICD-10-CM

## 2022-02-05 DIAGNOSIS — R7881 Bacteremia: Secondary | ICD-10-CM | POA: Diagnosis not present

## 2022-02-05 DIAGNOSIS — T82898A Other specified complication of vascular prosthetic devices, implants and grafts, initial encounter: Secondary | ICD-10-CM | POA: Diagnosis not present

## 2022-02-05 DIAGNOSIS — I38 Endocarditis, valve unspecified: Secondary | ICD-10-CM | POA: Diagnosis not present

## 2022-02-05 DIAGNOSIS — G9340 Encephalopathy, unspecified: Secondary | ICD-10-CM

## 2022-02-05 DIAGNOSIS — M869 Osteomyelitis, unspecified: Secondary | ICD-10-CM | POA: Diagnosis not present

## 2022-02-05 DIAGNOSIS — E8729 Other acidosis: Secondary | ICD-10-CM

## 2022-02-05 DIAGNOSIS — E1169 Type 2 diabetes mellitus with other specified complication: Secondary | ICD-10-CM | POA: Diagnosis not present

## 2022-02-05 DIAGNOSIS — M86171 Other acute osteomyelitis, right ankle and foot: Secondary | ICD-10-CM | POA: Diagnosis not present

## 2022-02-05 DIAGNOSIS — E11649 Type 2 diabetes mellitus with hypoglycemia without coma: Secondary | ICD-10-CM

## 2022-02-05 DIAGNOSIS — G9341 Metabolic encephalopathy: Secondary | ICD-10-CM | POA: Diagnosis not present

## 2022-02-05 DIAGNOSIS — N186 End stage renal disease: Secondary | ICD-10-CM

## 2022-02-05 DIAGNOSIS — I6529 Occlusion and stenosis of unspecified carotid artery: Secondary | ICD-10-CM

## 2022-02-05 LAB — CBC
HCT: 33.4 % — ABNORMAL LOW (ref 39.0–52.0)
Hemoglobin: 11.2 g/dL — ABNORMAL LOW (ref 13.0–17.0)
MCH: 30.9 pg (ref 26.0–34.0)
MCHC: 33.5 g/dL (ref 30.0–36.0)
MCV: 92.3 fL (ref 80.0–100.0)
Platelets: 220 10*3/uL (ref 150–400)
RBC: 3.62 MIL/uL — ABNORMAL LOW (ref 4.22–5.81)
RDW: 16.3 % — ABNORMAL HIGH (ref 11.5–15.5)
WBC: 11.3 10*3/uL — ABNORMAL HIGH (ref 4.0–10.5)
nRBC: 0.4 % — ABNORMAL HIGH (ref 0.0–0.2)

## 2022-02-05 LAB — AEROBIC/ANAEROBIC CULTURE W GRAM STAIN (SURGICAL/DEEP WOUND): Gram Stain: NONE SEEN

## 2022-02-05 LAB — ECHOCARDIOGRAM COMPLETE
AR max vel: 2.01 cm2
AV Area VTI: 2.26 cm2
AV Area mean vel: 2.02 cm2
AV Mean grad: 8 mmHg
AV Peak grad: 14.7 mmHg
Ao pk vel: 1.92 m/s
Area-P 1/2: 4.68 cm2
Calc EF: 50.3 %
Height: 73 in
MV M vel: 3.64 m/s
MV Peak grad: 53 mmHg
S' Lateral: 3.4 cm
Single Plane A2C EF: 54.6 %
Single Plane A4C EF: 47.6 %
Weight: 4299.85 oz

## 2022-02-05 LAB — LIPID PANEL
Cholesterol: 119 mg/dL (ref 0–200)
HDL: 11 mg/dL — ABNORMAL LOW (ref >40)
LDL Cholesterol: 46 mg/dL (ref 0–99)
Total CHOL/HDL Ratio: 10.8 RATIO
Triglycerides: 312 mg/dL — ABNORMAL HIGH (ref <150)
VLDL: 62 mg/dL — ABNORMAL HIGH (ref 0–40)

## 2022-02-05 LAB — GLUCOSE, CAPILLARY
Glucose-Capillary: 92 mg/dL (ref 70–99)
Glucose-Capillary: 96 mg/dL (ref 70–99)
Glucose-Capillary: 125 mg/dL — ABNORMAL HIGH (ref 70–99)
Glucose-Capillary: 170 mg/dL — ABNORMAL HIGH (ref 70–99)

## 2022-02-05 LAB — BASIC METABOLIC PANEL
Anion gap: 12 (ref 5–15)
BUN: 50 mg/dL — ABNORMAL HIGH (ref 6–20)
CO2: 25 mmol/L (ref 22–32)
Calcium: 8.8 mg/dL — ABNORMAL LOW (ref 8.9–10.3)
Chloride: 95 mmol/L — ABNORMAL LOW (ref 98–111)
Creatinine, Ser: 11.23 mg/dL — ABNORMAL HIGH (ref 0.61–1.24)
GFR, Estimated: 5 mL/min — ABNORMAL LOW (ref >60)
Glucose, Bld: 72 mg/dL (ref 70–99)
Potassium: 3.6 mmol/L (ref 3.5–5.1)
Sodium: 132 mmol/L — ABNORMAL LOW (ref 135–145)

## 2022-02-05 MED ORDER — ASPIRIN EC 81 MG PO TBEC
81.0000 mg | DELAYED_RELEASE_TABLET | Freq: Every day | ORAL | Status: DC
  Administered 2022-02-06 – 2022-02-20 (×14): 81 mg via ORAL
  Filled 2022-02-05 (×14): qty 1

## 2022-02-05 MED ORDER — STROKE: EARLY STAGES OF RECOVERY BOOK
Freq: Once | Status: AC
  Filled 2022-02-05: qty 1

## 2022-02-05 MED ORDER — INSULIN GLARGINE-YFGN 100 UNIT/ML ~~LOC~~ SOLN
20.0000 [IU] | Freq: Every day | SUBCUTANEOUS | Status: DC
  Administered 2022-02-05 – 2022-02-18 (×13): 20 [IU] via SUBCUTANEOUS
  Filled 2022-02-05 (×14): qty 0.2

## 2022-02-05 MED ORDER — HEPARIN SODIUM (PORCINE) 1000 UNIT/ML DIALYSIS
2500.0000 [IU] | INTRAMUSCULAR | Status: DC | PRN
  Filled 2022-02-05: qty 3

## 2022-02-05 NOTE — Progress Notes (Signed)
Inpatient Diabetes Program Recommendations ? ?AACE/ADA: New Consensus Statement on Inpatient Glycemic Control  ? ?Target Ranges:  Prepandial:   less than 140 mg/dL ?     Peak postprandial:   less than 180 mg/dL (1-2 hours) ?     Critically ill patients:  140 - 180 mg/dL  ? ? Latest Reference Range & Units 02/04/22 03:33 02/04/22 07:09 02/04/22 12:39 02/04/22 15:50 02/05/22 06:54  ?Glucose-Capillary 70 - 99 mg/dL 223 (H) 197 (H) 188 (H) 174 (H) 92  ? ? ?Review of Glycemic Control ? ?Diabetes history: DM2 ?Outpatient Diabetes medications: Tresiba 30 units daily (noted on home med list); however, patient reports he was taking 70/30 BID (did not specify dose) ?Current orders for Inpatient glycemic control: Semglee 30 units daily, Novolog 0-20 units Q4H ? ?Inpatient Diabetes Program Recommendations:   ? ?Insulin: Please consider decreasing Novolog correction to 0-15 units Q4H. If patient is eating well, may want to change frequency of CBGs and Novolog to AC&HS. ? ?Thanks, ?Barnie Alderman, RN, MSN, CDE ?Diabetes Coordinator ?Inpatient Diabetes Program ?[phone] (Team Pager from 8am to 5pm) ?  ? ? ?

## 2022-02-05 NOTE — Assessment & Plan Note (Addendum)
-  Outpatient vascular follow up ?

## 2022-02-05 NOTE — Progress Notes (Incomplete)
? ?[facility] Infectious Diseases Follow Up Note ? ?Patient Identification: ?Patient Name: [name] [name]. MRN: [id number] Admit Date: 02/02/2022  1:01 AM ?Age: 61 y.o.Today's Date: 02/05/2022 ? ?Reason for Visit: Follow up on osteomyelitis  ? ?Principal Problem: ?  Osteomyelitis ([place]) ?Active Problems: ?  ESRD (end stage renal disease) ([place]) ?  DM2 (diabetes mellitus, type 2) ([place]) ?  HTN (hypertension) ?  Chronic systolic CHF (congestive heart failure) ([place]) ?  Acute metabolic encephalopathy ?  Severe sepsis ([place]) ?  Acute respiratory failure with hypoxia ([place]) ?  Protein-calorie malnutrition, mild ([place]) ?  GERD (gastroesophageal reflux disease) ?  Hyponatremia ?  Hyperkalemia ?  High anion gap metabolic acidosis ?  Anemia of chronic renal failure ?  Necrotizing soft tissue infection ?  Diabetic foot infection ([place]) ?  Bacteremia ? ?Antibiotics:  ?Vancomycin 3/18-c ?Cefepime 3/18-c ?Metronidazole 3/18-c  ?  ?Lines/Hardware: left IJ HD catheter, left UE AVF ? ?Interval Events: Continues to be afebrile, more awake and following commands per RN.  On BiPAP at night ? ? ?Assessment ?#Necrotizing soft tissue infection of right fifth TMA site/possible osteomyelitis ?-S/p I&D and revision of fifth toe amputation on 3/19.  Cultures growing E faecalis and Morganella morganii ?-ABI normal in right, seen by vascular in 3/15 with no intervention recommended ? ?#Septic/toxic encephalopathy-improving, in the setting of sepsis/diabetic foot infection ? ?# GPC in diplos and GNR in 1/4 bottles  ? ?#Acute hypoxic respiratory failure -in the setting of above ?#DM2 with hyperglycemia ?#ESRD on HD ? ?Recommendations ?Continue vancomycin and cefepime with HD ?Continue metronidazole p.o. ?Follow-up or cultures, and pathology, ID of GPC in blood cx ?Follow-up with podiatry Recs ?Fu MRI brain  ? ?Rest of the management as per the primary team. ?Thank you for the consult. Please page with  pertinent questions or concerns. ? ?______________________________________________________________________ ?Subjective ?patient seen and examined at the bedside. ?On BiPAP ?Response to voice/calling his name ?Per RN, he is more awake and follows commands ? ?Vitals ?BP 131/62   Pulse 70   Temp 98.5 ?F (36.9 ?C) (Oral)   Resp (!) 24   Ht 6\' 1"  (1.854 m)   Wt 121.9 kg   SpO2 100%   BMI 35.46 kg/m?  ? ?  ?Physical Exam ?Constitutional: Morbidly obese, sleeping ?   Comments: On BiPAP ? ?Cardiovascular:  ?   Rate and Rhythm: Normal rate and regular rhythm.  ?   Heart sounds:  ? ?Pulmonary:  ?   Effort: Pulmonary effort is normal on BiPAP ?   Comments:  ? ?Abdominal:  ?   Palpations: Abdomen is soft.  ?   Tenderness: Nondistended ? ?Musculoskeletal:     ?   General: No swelling or tenderness.  Right foot is bandaged ? ?Skin: ?   Comments: Left upper extremity AV F -nonswollen nontender ? ?Neurological:  ?   General: sleeping   ? ?Pertinent Microbiology ?Results for orders placed or performed during the hospital encounter of 02/02/22  ?Blood Culture (routine x 2)     Status: None (Preliminary result)  ? Collection Time: 02/02/22  1:50 AM  ? Specimen: BLOOD RIGHT ARM  ?Result Value Ref Range Status  ? Specimen Description   Final  ?  BLOOD RIGHT ARM ?Performed at [facility], [address], [place], [place] [address] ?  ? Special Requests   Final  ?  BOTTLES DRAWN AEROBIC AND ANAEROBIC Blood Culture adequate volume ?Performed at [facility], [address], [place], [place] [address] ?  ?  Culture  Setup Time   Final  ?  ANAEROBIC BOTTLE ONLY ?GRAM POSITIVE COCCI IN DIPLOS ?Gram Stain Report Called to,Read Back By and Verified With: RACHEL PUCKETT @ [phone] ON 02/04/22 C VARNER ?[place] ?CRITICAL RESULT CALLED TO, READ BACK BY AND VERIFIED WITH: PHARMD E.UELAND AT 2229 ON 02/04/2022 BY T.SAAD. ?Performed at [facility], [place] [name], [place], [place] [address] ?  ? Culture   Final  ?  NO GROWTH 3  DAYS ?Performed at [facility], [address] [place], [place] [address] ?  ? Report Status PENDING  Incomplete  ?Blood Culture ID Panel (Reflexed)     Status: Abnormal  ? Collection Time: 02/02/22  1:50 AM  ?Result Value Ref Range Status  ? Enterococcus faecalis NOT DETECTED NOT DETECTED Final  ? Enterococcus Faecium NOT DETECTED NOT DETECTED Final  ? Listeria monocytogenes NOT DETECTED NOT DETECTED Final  ? Staphylococcus species NOT DETECTED NOT DETECTED Final  ? Staphylococcus aureus (BCID) NOT DETECTED NOT DETECTED Final  ? Staphylococcus epidermidis NOT DETECTED NOT DETECTED Final  ? Staphylococcus lugdunensis NOT DETECTED NOT DETECTED Final  ? Streptococcus species NOT DETECTED NOT DETECTED Final  ? Streptococcus agalactiae NOT DETECTED NOT DETECTED Final  ? Streptococcus pneumoniae NOT DETECTED NOT DETECTED Final  ? Streptococcus pyogenes NOT DETECTED NOT DETECTED Final  ? A.calcoaceticus-baumannii NOT DETECTED NOT DETECTED Final  ? Bacteroides fragilis NOT DETECTED NOT DETECTED Final  ? Enterobacterales DETECTED (A) NOT DETECTED Final  ?  Comment: Enterobacterales represent a large order of gram negative bacteria, not a single organism. Refer to culture for further identification. ?CRITICAL RESULT CALLED TO, READ BACK BY AND VERIFIED WITH: ?PHARMD E.UELAND AT 7408 ON 02/04/2022 BY T.SAAD. ?  ? Enterobacter cloacae complex NOT DETECTED NOT DETECTED Final  ? Escherichia coli NOT DETECTED NOT DETECTED Final  ? Klebsiella aerogenes NOT DETECTED NOT DETECTED Final  ? Klebsiella oxytoca NOT DETECTED NOT DETECTED Final  ? Klebsiella pneumoniae NOT DETECTED NOT DETECTED Final  ? Proteus species NOT DETECTED NOT DETECTED Final  ? Salmonella species NOT DETECTED NOT DETECTED Final  ? Serratia marcescens NOT DETECTED NOT DETECTED Final  ? Haemophilus influenzae NOT DETECTED NOT DETECTED Final  ? Neisseria meningitidis NOT DETECTED NOT DETECTED Final  ? Pseudomonas aeruginosa NOT DETECTED NOT DETECTED Final  ?  Stenotrophomonas maltophilia NOT DETECTED NOT DETECTED Final  ? Candida albicans NOT DETECTED NOT DETECTED Final  ? Candida auris NOT DETECTED NOT DETECTED Final  ? Candida glabrata NOT DETECTED NOT DETECTED Final  ? Candida krusei NOT DETECTED NOT DETECTED Final  ? Candida parapsilosis NOT DETECTED NOT DETECTED Final  ? Candida tropicalis NOT DETECTED NOT DETECTED Final  ? Cryptococcus neoformans/gattii NOT DETECTED NOT DETECTED Final  ? CTX-M ESBL NOT DETECTED NOT DETECTED Final  ? Carbapenem resistance IMP NOT DETECTED NOT DETECTED Final  ? Carbapenem resistance KPC NOT DETECTED NOT DETECTED Final  ? Carbapenem resistance NDM NOT DETECTED NOT DETECTED Final  ? Carbapenem resist OXA 48 LIKE NOT DETECTED NOT DETECTED Final  ? Carbapenem resistance VIM NOT DETECTED NOT DETECTED Final  ?  Comment: Performed at [facility], [address] [place], [place] [address]  ?Blood Culture (routine x 2)     Status: None (Preliminary result)  ? Collection Time: 02/02/22  1:52 AM  ? Specimen: BLOOD RIGHT HAND  ?Result Value Ref Range Status  ? Specimen Description BLOOD RIGHT HAND  Final  ? Special Requests   Final  ?  BOTTLES DRAWN AEROBIC ONLY Blood Culture adequate volume  ? Culture   Final  ?  NO GROWTH 3 DAYS ?Performed at [facility], [address] [place], [place] [address] ?  ? Report Status PENDING  Incomplete  ?Resp Panel by RT-PCR (Flu A&B, Covid) Nasopharyngeal Swab     Status: None  ? Collection Time: 02/02/22  7:59 AM  ? Specimen: Nasopharyngeal Swab; Nasopharyngeal(NP) swabs in vial transport medium  ?Result Value Ref Range Status  ? SARS Coronavirus 2 by RT PCR NEGATIVE NEGATIVE Final  ?  Comment: (NOTE) ?SARS-CoV-2 target nucleic acids are NOT DETECTED. ? ?The SARS-CoV-2 RNA is generally detectable in upper respiratory ?specimens during the acute phase of infection. The lowest ?concentration of SARS-CoV-2 viral copies this assay can detect is ?138 copies/mL. A negative result does not preclude  SARS-Cov-2 ?infection and should not be used as the sole basis for treatment or ?other patient management decisions. A negative result may occur with  ?improper specimen collection/handling, submission of specimen other ?than

## 2022-02-05 NOTE — Progress Notes (Signed)
Physical Therapy Treatment ?Patient Details ?Name: [name] DUCHEMIN Sr. ?MRN: [id number] ?DOB: [birth year] ?Today's Date: 02/05/2022 ? ? ?History of Present Illness 61 y.o. male  presents the ED 02/02/22 with altered mental status. Pt found to have osteomyelitis of R foot, sepsis and acute respiratory failure with hypoxia with acute metabolic encephalopathy. 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 Transferred to ICU after LoC and hypotension systolic in 78E placed on 5L O2 and given Narcan.PMH: diabetes mellitus type 2, CHF, ESRD, hypertension, myocardial infarction, nonischemic cardiomyopathy ? ?  ?PT Comments  ? ? Soundly asleep after return from HD so followed back 2 hours later. Pt asleep on entry, rouses and opens eyes. Groans in response to questions, does not vocalize response.  Performs limited AAROM of LE with maximal stimulation and cuing. However, quickly tires and no longer participates in therapy. D/c plans remain appropriate at this time. PT will continue to follow acutely. ?  ?Recommendations for follow up therapy are one component of a multi-disciplinary discharge planning process, led by the attending physician.  Recommendations may be updated based on patient status, additional functional criteria and insurance authorization. ? ?Follow Up Recommendations ? Skilled nursing-short term rehab (<3 hours/day) ?  ?  ?Assistance Recommended at Discharge Frequent or constant Supervision/Assistance  ?Patient can return home with the following Two people to help with walking and/or transfers;Two people to help with bathing/dressing/bathroom;Assistance with cooking/housework;Assistance with feeding;Direct supervision/assist for medications management;Direct supervision/assist for financial management;Assist for transportation;Help with stairs or ramp for entrance ?  ?Equipment Recommendations ? Rolling walker (2 wheels);BSC/3in1  ?  ?Recommendations for Other Services OT consult ? ? ?  ?Precautions / Restrictions  Precautions ?Precautions: Fall ?Restrictions ?Weight Bearing Restrictions: Yes ?RLE Weight Bearing: Non weight bearing  ?  ? ?Mobility ? Bed Mobility ?  ?  ?  ?  ?  ?  ?  ?General bed mobility comments: unable due to level of arousal ?  ? ? ? ?  ?   ?Cognition Arousal/Alertness: Lethargic ?Behavior During Therapy: Flat affect ?Overall Cognitive Status: Difficult to assess ?  ?  ?  ?  ?  ?  ?  ?  ?  ?  ?  ?  ?  ?  ?  ?  ?  ?  ?  ? ?  ?Exercises General Exercises - Lower Extremity ?Ankle Circles/Pumps: PROM, 5 reps, Supine ?Heel Slides: AAROM, 5 reps, Supine ?Straight Leg Raises: AAROM, Both, 5 reps, Supine ? ?  ?General Comments General comments (skin integrity, edema, etc.): VSS on RA ?  ?  ? ?Pertinent Vitals/Pain Pain Assessment ?Pain Assessment: Faces ?Faces Pain Scale: Hurts a little bit ?Pain Location: generalized wtih movement ?Pain Descriptors / Indicators: Grimacing, Guarding, Moaning  ? ? ? ?PT Goals (current goals can now be found in the care plan section) Acute Rehab PT Goals ?PT Goal Formulation: With family ?Time For Goal Achievement: 02/17/22 ?Potential to Achieve Goals: Good ?Progress towards PT goals: Not progressing toward goals - comment ? ?  ?Frequency ? ? ? Min 2X/week ? ? ? ?  ?PT Plan Current plan remains appropriate  ? ? ?   ?AM-PAC PT "6 Clicks" Mobility   ?Outcome Measure ? Help needed turning from your back to your side while in a flat bed without using bedrails?: Total ?Help needed moving from lying on your back to sitting on the side of a flat bed without using bedrails?: Total ?Help needed moving to and from a bed to a chair (  including a wheelchair)?: Total ?Help needed standing up from a chair using your arms (e.g., wheelchair or bedside chair)?: Total ?Help needed to walk in hospital room?: Total ?Help needed climbing 3-5 steps with a railing? : Total ?6 Click Score: 6 ? ?  ?End of Session   ?Activity Tolerance: Patient limited by lethargy ?Patient left: in bed;with call bell/phone  within reach;with bed alarm set ?Nurse Communication: Mobility status ?PT Visit Diagnosis: Muscle weakness (generalized) (M62.81);Difficulty in walking, not elsewhere classified (R26.2) ?  ? ? ?Time: 2575-0518 ?PT Time Calculation (min) (ACUTE ONLY): 14 min ? ?Charges:  $Therapeutic Exercise: 8-22 mins          ?          ? ?Arietta Eisenstein B. [name] PT, DPT ?Acute Rehabilitation Services ?Pager [phone] ?Office [phone] ? ? ? ?[place] ?02/05/2022, 2:37 PM ? ?

## 2022-02-05 NOTE — Progress Notes (Signed)
Subjective: ?[name] ACTON Sr. is a 61 y.o. male patient seen at bedside, resting comfortably in no acute distress with neurology resident present.  Patient is s/p day #3  Right I&D with revision of fifth ray amputation site, performed by Dr. Blenda Mounts. Patient does not voice any concerns at this time with latency in speech, lethargic no other issues noted. ? ?Daughter not present at bedside this visit. ? ?Patient Active Problem List  ? Diagnosis Date Noted  ? Stroke [facility]) 02/05/2022  ? Carotid stenosis 02/05/2022  ? Bacteremia   ? Hyponatremia 02/03/2022  ? Hyperkalemia 02/03/2022  ? High anion gap metabolic acidosis 10/62/6948  ? Anemia of chronic renal failure 02/03/2022  ? Necrotizing soft tissue infection   ? Cellulitis in diabetic foot ([employer])   ? Osteomyelitis ([place]) 02/02/2022  ? Acute metabolic encephalopathy 54/62/7035  ? Severe sepsis ([place]) 02/02/2022  ? Acute respiratory failure with hypoxia ([place]) 02/02/2022  ? Protein-calorie malnutrition, mild ([place]) 02/02/2022  ? GERD (gastroesophageal reflux disease) 02/02/2022  ? Chronic systolic CHF (congestive heart failure) ([place]) 08/14/2019  ? LBBB (left bundle branch block) 08/13/2019  ? Problem with vascular access 08/13/2019  ? Dialysis AV fistula malfunction ([place]) 08/12/2019  ? ESRD (end stage renal disease) ([place]) 09/20/2018  ? Fever 09/20/2018  ? Nausea & vomiting 09/20/2018  ? DM2 (diabetes mellitus, type 2) ([place]) 09/20/2018  ? HTN (hypertension) 09/20/2018  ? ? ? ?Current Facility-Administered Medications:  ?  (feeding supplement) PROSource Plus liquid 30 mL, 30 mL, Oral, BID BM, Caren Griffins, MD, 30 mL at 02/04/22 0093 ?  acetaminophen (TYLENOL) tablet 650 mg, 650 mg, Oral, Q6H PRN, 650 mg at 02/02/22 1540 **OR** acetaminophen (TYLENOL) suppository 650 mg, 650 mg, Rectal, Q6H PRN, [name], MD ?  albuterol (PROVENTIL) (2.5 MG/3ML) 0.083% nebulizer solution 2.5 mg, 2.5 mg, Nebulization, Q2H PRN, [name], MD ?  ascorbic acid (VITAMIN C) tablet  500 mg, 500 mg, Oral, Daily, Caren Griffins, MD, 500 mg at 02/04/22 8182 ?  aspirin EC tablet 81 mg, 81 mg, Oral, Daily, Rosalin Hawking, MD ?  ceFEPIme (MAXIPIME) 2 g in sodium chloride 0.9 % 100 mL IVPB, 2 g, Intravenous, Q M,W,F-2000, [name], MD, Stopped at 02/03/22 2010 ?  Chlorhexidine Gluconate Cloth 2 % PADS 6 each, 6 each, Topical, Q0600, Adelfa Koh, NP, 6 each at 02/05/22 1803 ?  docusate sodium (COLACE) capsule 100 mg, 100 mg, Oral, BID PRN, [place] Crane M, PA-C ?  famotidine (PEPCID) tablet 20 mg, 20 mg, Oral, Daily, Caren Griffins, MD, 20 mg at 02/04/22 2244 ?  feeding supplement (NEPRO CARB STEADY) liquid 237 mL, 237 mL, Oral, BID BM, Caren Griffins, MD, 237 mL at 02/05/22 1819 ?  heparin injection 5,000 Units, 5,000 Units, Subcutaneous, Q8H, [name], MD, 5,000 Units at 02/05/22 1643 ?  insulin aspart (novoLOG) injection 0-20 Units, 0-20 Units, Subcutaneous, Q4H, Dela Newman Nickels, MD, 3 Units at 02/05/22 1700 ?  insulin glargine-yfgn (SEMGLEE) injection 20 Units, 20 Units, Subcutaneous, Daily, Elodia Florence., MD, 20 Units at 02/05/22 1600 ?  metroNIDAZOLE (FLAGYL) tablet 500 mg, 500 mg, Oral, Q12H, Gherghe, Costin M, MD, 500 mg at 02/04/22 2244 ?  multivitamin (RENA-VIT) tablet 1 tablet, 1 tablet, Oral, QHS, Caren Griffins, MD, 1 tablet at 02/04/22 2244 ?  mupirocin ointment (BACTROBAN) 2 % 1 application., 1 application., Nasal, BID, Margaretha Seeds, MD, 1 application. at 02/04/22 2244 ?  ondansetron (ZOFRAN) tablet 4 mg, 4 mg,  Oral, Q6H PRN **OR** ondansetron (ZOFRAN) injection 4 mg, 4 mg, Intravenous, Q6H PRN, [name], MD ?  pantoprazole (PROTONIX) EC tablet 40 mg, 40 mg, Oral, Q1200, Caren Griffins, MD, 40 mg at 02/04/22 1138 ?  polyethylene glycol (MIRALAX / GLYCOLAX) packet 17 g, 17 g, Oral, Daily PRN, [name], MD ?  sodium chloride 0.9 % bolus 250 mL, 250 mL, Intravenous, Once PRN, Shela Leff, MD ?  sucroferric  oxyhydroxide (VELPHORO) chewable tablet 1,000 mg, 1,000 mg, Oral, TID WC, [name], MD, 1,000 mg at 02/05/22 1812 ?  thiamine (B-1) injection 100 mg, 100 mg, Intravenous, Daily, 100 mg at 02/03/22 0242 **OR** thiamine tablet 100 mg, 100 mg, Oral, Daily, Donnetta Simpers, MD, 100 mg at 02/04/22 1219 ?  vancomycin (VANCOCIN) IVPB 1000 mg/200 mL premix, 1,000 mg, Intravenous, Q M,W,F-HD, [name], MD, Stopping previously hung infusion at 02/05/22 1901 ?  zinc sulfate capsule 220 mg, 220 mg, Oral, BID, Caren Griffins, MD, 220 mg at 02/04/22 2244 ? ?Allergies  ?Allergen Reactions  ? Sulfa Antibiotics Nausea And Vomiting  ? ? ? ?Objective: ?Today's Vitals  ? 02/05/22 1800 02/05/22 1830 02/05/22 1900 02/05/22 1954  ?BP: (!) 162/78  (!) 175/127   ?Pulse: 84 89 87 86  ?Resp: (!) 21 17 (!) 24 16  ?Temp:    (!) 97.4 ?F (36.3 ?C)  ?TempSrc:    Oral  ?SpO2: 99% 100% 100% 100%  ?Weight:      ?Height:      ?PainSc: 0-No pain     ? ? ?General: No acute distress ? ?Right Lower extremity: Dressing to Right foot clean, dry, intact. No strikethrough noted, Upon removal of dressings there is a large wound at the dorsal lateral right foot that measures greater than 5 cm with exposed subcutaneous tissue tendon and bone at the proximal resection site of the revised fifth ray amputation, as previously noted, there is small areas of marginal necrosis and superior to the wound there is a necrotic blister that appears to be about the same however there is more increased edema and erythema at the dorsal lateral foot extending to the ankle on the right.  Pedal pulses are present on the right.  No obvious pain or grimace with palpation to foot or calf. ? ? ? ?Assessment and Plan:  ?Problem List Items Addressed This Visit   ? ?  ? Genitourinary  ? ESRD (end stage renal disease) ([place]) - Primary  ?  Per renal ?Being evaluated outpatient for possible transplant ?Plan for fistulogram 3/23 with vascular ?  ?  ? ?Other Visit Diagnoses    ? ? Delirium      ? Right foot infection      ? Relevant Medications  ? ceFEPIme (MAXIPIME) 2 g in sodium chloride 0.9 % 100 mL IVPB (Completed)  ? metroNIDAZOLE (FLAGYL) IVPB 500 mg (Completed)  ? vancomycin (VANCOREADY) IVPB 2000 mg/400 mL (Completed)  ? vancomycin (VANCOCIN) IVPB 1000 mg/200 mL premix  ? ceFEPIme (MAXIPIME) 2 g in sodium chloride 0.9 % 100 mL IVPB  ? mupirocin ointment (BACTROBAN) 2 % 1 application.  ? metroNIDAZOLE (FLAGYL) tablet 500 mg  ? ?  ? ? ?-Patient seen and evaluated at bedside ?-Dressing change performed applied saline wet-to-dry dressing on the right nursing to continue to assist with dressing changes as ordered as well ?-Right foot infection appears to be progressing at this time we will order a MRI to further evaluate the extent of the soft tissue infection  versus osteomyelitis; case was discussed with hospitalist and daughter ?-Continue with antibiotics as directed by infectious disease ?-Continue with rest and elevation to assist with pain and edema control  ?-Podiatry will continue to follow closely. ? ?[name], DPM ?[facility] ?[phone] office ?[phone] cell ?Available via secure chat  ?

## 2022-02-05 NOTE — Assessment & Plan Note (Addendum)
-  MRI brain 3/22 with numerous punctate acute infarctions throughout both cerebral hemispheres, primarily affecting the deep brain ?-Echo with Ef 45-50%, global hypokinesis, RVSF mildly reduced ?-CTA neck 3/20 without LVO or hemodynamically significant stenosis of intracranial arteries, did have 70% stenosis for R ICA due to mixed density atherosclerosis ?-CT venogram normal ?-LDL 46, A1c 12.2 ?-Appreciate neurology recommendations - suspect likely 2/2 hypoperfusion with intermittent hypotension.   ?-Aspirin 81 mg daily.  No statin at this time, -LDL at goal. ?-Will need outpatient follow-up with Neurology. ? ?

## 2022-02-05 NOTE — Progress Notes (Signed)
Pt receives out-pt HD at [facility]) on MWF. Pt arrives at 11:00 for 11:15 chair time. Will assist as needed.  ? ?Melven Sartorius ?Renal Navigator ?[phone] ?

## 2022-02-05 NOTE — Progress Notes (Signed)
Pharmacy Antibiotic Note ? ?[name] SOFFER Sr. is a 61 y.o. male admitted on 02/02/2022 with  diabetic foot infection with a high risk for MRSA .  Pharmacy has been consulted for cefepime and vancomycin dosing. ? ?[name] [name] admission he has grown morganella and enterococcus from the wound and has GPCs and Morganella in the blood. ? ?WBC 11.3, afebrile ? ?Plan: ?Continue Vancomycin 1000 mg IV after each hemodialysis. (Target pre-HD vanc level 15-25 mcg/mL) ? ?Continue Cefepime 2 g IV after each HD ?Continue Flagyl 500 mg po q12hr ? ?Height: 6\' 1"  (185.4 cm) ?Weight: 121.9 kg (268 lb 11.9 oz) ?IBW/kg (Calculated) : 79.9 ? ?Temp (24hrs), Avg:98.3 ?F (36.8 ?C), Min:97.7 ?F (36.5 ?C), Max:98.6 ?F (37 ?C) ? ?Recent Labs  ?Lab 02/02/22 ?0107 02/02/22 ?0329 02/02/22 ?0604 02/02/22 ?0920 02/03/22 ?0037 02/03/22 ?0408 02/04/22 ?0217 02/05/22 ?0144  ?WBC 12.0*  --   --  12.4* 13.6* 11.6* 11.0* 11.3*  ?CREATININE 11.09*  --  11.09*  --  13.10*  13.19* 13.14* 9.58* 11.23*  ?LATICACIDVEN 3.1* 2.2* 2.5*  --  2.6* 2.5*  --   --   ? ?  ?Estimated Creatinine Clearance: 9.7 mL/min (A) (by C-G formula based on SCr of 11.23 mg/dL (H)).   ? ?Allergies  ?Allergen Reactions  ? Sulfa Antibiotics Nausea And Vomiting  ? ? ?Antimicrobials this admission: ?Vancomycin 3/19 >>  ?Cefepime 3/19 >>  ?Metronidazole 3/19 >> ? ?3/19 BCx - GPCs and morganella morganii ?3/19 R foot wound, spec A - Morganella morganii, E.faecalis ?3/19 5th MT amputation tissue, spec B - Morganella  ?3/20 MRSA PCR - not detected ? ?Thank you for allowing pharmacy to be a part of this patient?s care. ? ?Alanda Slim, PharmD, FCCM ?Clinical Pharmacist ?Please see AMION for all Pharmacists' Contact Phone Numbers ?02/05/2022, 12:48 PM  ? ? ?

## 2022-02-05 NOTE — Progress Notes (Signed)
?PROGRESS NOTE ? ? ? ?[name] PREMO Sr.  ZJQ:[id number] DOB: 23-Nov-1960 DOA: 02/02/2022 ?PCP: [facility]  ?Chief Complaint  ?Patient presents with  ? Altered Mental Status  ? ? ?Brief Narrative:  ?61 y/o male history of DM, ESRD on HD, admitted to the hospital with fever, acute encephalopathy, sepsis and right foot infection.  He has Keoni Havey history of prior right fifth metatarsal amputation performed many years ago in [place].  Plain films of his foot indicate swelling and subcutaneous gas without any obvious bony erosion. He initially presented to [facility] and subsequently transferred to [facility]  Podiatry consulted, he was taken emergently to the [facility] on 3/19.  Postoperatively he has had persistent lethargy and was taken to the ICU 3/19 evening.  He was placed on Narcan drip for presumed lethargy due to opioid use however he only got 5 mg of oxycodone.  Narcan drip was discontinued on 3/20 and he was transferred out of the ICU. ? ?See below for additional details ?   ? ? ?Assessment & Plan: ?  ?Principal Problem: ?  Osteomyelitis ([place]) ?Active Problems: ?  Severe sepsis ([place]) ?  Stroke [facility]) ?  Acute metabolic encephalopathy ?  ESRD (end stage renal disease) ([place]) ?  DM2 (diabetes mellitus, type 2) ([place]) ?  HTN (hypertension) ?  Chronic systolic CHF (congestive heart failure) ([place]) ?  Acute respiratory failure with hypoxia ([place]) ?  Protein-calorie malnutrition, mild ([place]) ?  GERD (gastroesophageal reflux disease) ?  Carotid stenosis ?  Hyponatremia ?  Hyperkalemia ?  High anion gap metabolic acidosis ?  Anemia of chronic renal failure ?  Necrotizing soft tissue infection ?  Cellulitis in diabetic foot ([place]) ?  Bacteremia ? ? 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 ? ?Stroke [facility]) ?MRI brain 3/22 with numerous punctate acute infarctions throughout both cerebral hemispheres, primarily affecting the deep brain ?Echo with Ef 45-50%, global hypokinesis, RVSF mildly reduced ?CTA neck 3/20 without LVO or hemodynamically significant stenosis of intracranial arteries, did have 70% stenosis for R ICA due to mixed density atherosclerosis ?CT venogram normal ?LDL 46, A1c 12.2 ?Appreciate neurology recommendations - suspect likely 2/2 hypoperfusion with intermittent hypotension.  Aspirin 81 mg daily.  No statin at this time, LDL at goal. ? ? ?Acute metabolic encephalopathy ?Related to stroke as well as infection and acute hospitalization ?Minimally interactive today, not speaking, tracks Terri Rorrer bit and follows simple commands, will continue to follow ?Follow EEG ?Appreciate neurology, concern for cytotoxic lesion of corpus callosum on MRI, likely related to metabolic derangement -> hyperglycemia, uremia, sepsis, etc ?Delirium precautions, will treat underlying cause ? ?ESRD (end stage renal disease) ([place]) ?Per renal ?Being evaluated outpatient for possible transplant ?Plan for fistulogram 3/23 with vascular ? ?DM2 (diabetes mellitus, type 2) ([place]) ?a1c 12.2 ?Reduce basal insulin, continue SSI ?At home taking 30 units daily ? ? ?HTN (hypertension) ?Intermittent hypotension, concern that this may have resulted in watershed infarcts ?Will hold BP meds for now, not on anything  at home ?BP in 967'R systolic, will follow at this time ? ?Acute respiratory failure with hypoxia ([place]) ?Currently has been weaned to ra, continue to monitor ?CXR 3/20 without frank edema ? ?Chronic  systolic CHF (congestive heart failure) ([place]) ?EF 45% per echo 05/2021 ?Volume per renal ? ?Carotid stenosis ?Outpatient vascular follow up ? ?GERD (gastroesophageal reflux disease) ?Continue Protonix ? ?Protein-calorie malnutrition, mild ([place]) ?Patient currently n.p.o. with mental status, SLP eval ? ? ? ?DVT prophylaxis: heparin ?Code Status: full ?Family Communication: daughter ?Disposition:  ? ?Status is: Inpatient ?Remains inpatient appropriate because: need for IV abx, continued w/u encephalopathy ?  ?Consultants:  ?Neurology ?Vascular ?ID ?Renal ?podiatry ? ?Procedures:  ?Echo ?IMPRESSIONS  ? ? ? 1. Left ventricular ejection fraction, by estimation, is 45 to 50%. The  ?left ventricle has mildly decreased function. The left ventricle  ?demonstrates global hypokinesis. There is moderate left ventricular  ?hypertrophy. Left ventricular diastolic function  ? could not be evaluated.  ? 2. Right ventricular systolic function is mildly reduced. The right  ?ventricular size is normal.  ? 3. The mitral valve is degenerative. Mild mitral valve regurgitation. No  ?evidence of mitral stenosis. Moderate to severe mitral annular  ?calcification.  ? 4. The aortic valve is tricuspid. There is moderate calcification of the  ?aortic valve. There is moderate thickening of the aortic valve. Aortic  ?valve regurgitation is not visualized. Mild aortic valve stenosis. Aortic  ?valve area, by VTI measures 2.26  ?cm?Marland Kitchen Aortic valve mean gradient measures 8.0 mmHg. Aortic valve Vmax  ?measures 1.92 m/s.  ? ?Conclusion(s)/Recommendation(s): No evidence of valvular vegetations on  ?this transthoracic echocardiogram. Consider Sequoyah Counterman transesophageal  ?echocardiogram to exclude infective endocarditis if clinically indicated. ? ?Burnard Bunting ?Summary:  ?Right: Resting right ankle-brachial index is within normal range. No  ?evidence of significant right lower extremity arterial disease. The right  ?toe-brachial index is normal.  ? ?Left: Resting left  ankle-brachial index indicates noncompressible left  ?lower extremity arteries. The left toe-brachial index is abnormal. ? ?Antimicrobials:  ?Anti-infectives (From admission, onward)  ? ? Start     Dose/Rate Route Frequency Ordered Stop  ? 02/03/22 2200  metroNIDAZOLE (FLAGYL) tablet 500 mg       ? 500 mg Oral Every 12 hours 02/03/22 1108 02/09/22 0959  ? 02/03/22 2000  ceFEPIme (MAXIPIME) 2 g in sodium chloride 0.9 % 100 mL IVPB       ? 2 g ?200 mL/hr over 30 Minutes Intravenous Every M-W-F (2000) 02/02/22 0459    ? 02/03/22 1200  vancomycin (VANCOCIN) IVPB 1000 mg/200 mL premix       ? 1,000 mg ?200 mL/hr over 60 Minutes Intravenous Every M-W-F (Hemodialysis) 02/02/22 0459    ? 02/03/22 1030  metroNIDAZOLE (FLAGYL) IVPB 500 mg  Status:  Discontinued       ? 500 mg ?100 mL/hr over 60 Minutes Intravenous Every 12 hours 02/03/22 0934 02/03/22 1108  ? 02/02/22 1000  metroNIDAZOLE (FLAGYL) tablet 500 mg  Status:  Discontinued       ? 500 mg Oral Every 12 hours 02/02/22 0354 02/03/22 0934  ? 02/02/22 0130  ceFEPIme (MAXIPIME) 2 g in sodium chloride 0.9 % 100 mL IVPB       ? 2 g ?200 mL/hr over 30 Minutes Intravenous  Once 02/02/22 0119 02/02/22 0217  ? 02/02/22 0130  metroNIDAZOLE (FLAGYL) IVPB 500 mg       ? 500 mg ?100 mL/hr over 60 Minutes Intravenous  Once 02/02/22 0119 02/02/22 0539  ?  02/02/22 0130  vancomycin (VANCOCIN) IVPB 1000 mg/200 mL premix  Status:  Discontinued       ? 1,000 mg ?200 mL/hr over 60 Minutes Intravenous  Once 02/02/22 0119 02/02/22 0124  ? 02/02/22 0130  vancomycin (VANCOREADY) IVPB 2000 mg/400 mL       ? 2,000 mg ?200 mL/hr over 120 Minutes Intravenous  Once 02/02/22 0125 02/02/22 0432  ? ?  ? ? ?Subjective: ?Not speaking today ? ?Objective: ?Vitals:  ? 02/05/22 1700 02/05/22 1720 02/05/22 1800 02/05/22 1830  ?BP:  (!) 166/90 (!) 162/78   ?Pulse: 88 87 84 89  ?Resp: 20 16 (!) 21 17  ?Temp:      ?TempSrc:      ?SpO2: 100% 100% 99% 100%  ?Weight:      ?Height:      ? ? ?Intake/Output Summary  (Last 24 hours) at 02/05/2022 1849 ?Last data filed at 02/05/2022 1139 ?Gross per 24 hour  ?Intake --  ?Output 1800 ml  ?Net -1800 ml  ? ?Filed Weights  ? 02/04/22 0500 02/05/22 0500 02/05/22 0740  ?Weight: 120.8 kg 1

## 2022-02-05 NOTE — Consult Note (Addendum)
Hospital Consult    Reason for Consult:  dialysis access Requesting Physician:  Alonna Buckler MRN #:  [id number]  History of Present Illness: This is a 61 y.o. male with ESRD in need of evaluation of his dialysis access.  He has hx of left upper arm AVF.  He was originally seen by Vascular Surgery in 2020 for difficulty accessing his fistula.  He was taken for thrombectomy of his fistula 08/13/2019.  He then underwent balloon angioplasty of the left SCV and left cephalic vein on 02/23/8118.  On 07/19/2020, he underwent revision of his left ar fistula with plication.  Vascular surgery is consulted for area of thinning skin.    Pt just back from dialysis and very sleepy and unable to obtain information from him.   He was recently seen by Dr. Arbie Cookey for evaluation of arterial flow in BLE.  He did not feel pt had evidence of arterial insufficiency and felt that it was related to pressure over the old metatarsal bone resection site that has been slow to heal and recommended referral to Dr. Lajoyce Corners for conservative vs surgical management.    The pt is not on a statin for cholesterol management.  The pt is on a daily aspirin.   Other AC:  sq heparin for DVT prophylaxis The pt is not on medication for hypertension.   The pt is diabetic.   Tobacco hx:  never  Past Medical History:  Diagnosis Date   CHF (congestive heart failure) (HCC)    Diabetes mellitus without complication (HCC)    type 2   ESRD on hemodialysis (HCC)    mon wed fri   Hypertension    Kidney failure    Myocardial infarction (HCC)    Nonischemic cardiomyopathy [facility])     Past Surgical History:  Procedure Laterality Date   A/V FISTULAGRAM N/A 08/15/2019   Procedure: A/V FISTULAGRAM;  Surgeon: Maeola Harman, MD;  Location: [facility] INVASIVE CV LAB;  Service: Cardiovascular;  Laterality: N/A;   AMPUTATION Right 02/02/2022   Procedure: AMPUTATION 5th RAY;  Surgeon: Louann Sjogren, MD;  Location: [facility] OR;  Service: Podiatry;   Laterality: Right;   AV FISTULA PLACEMENT     CARDIAC CATHETERIZATION  02/17/2018   LHC 02/17/18 ([name]): 30% pLAD, 30% dRCA, LVEDP 25-30, LVEF 25%.     CATARACT EXTRACTION W/PHACO Right 03/29/2020   Procedure: CATARACT EXTRACTION PHACO AND INTRAOCULAR LENS PLACEMENT (IOC)  RIGHT DIABETIC;  Surgeon: Lockie Mola, MD;  Location: [facility] ORS;  Service: Ophthalmology;  Laterality: Right;  Lot # [id number] H [place]: 02:30.9 AP% 9.5% CDE: 14.38   EYE SURGERY Right    cataract removed   I & D EXTREMITY Right 02/02/2022   Procedure: IRRIGATION AND DEBRIDEMENT RIGHT FOOT;  Surgeon: Louann Sjogren, MD;  Location: [facility] OR;  Service: Podiatry;  Laterality: Right;   INSERTION OF DIALYSIS CATHETER  08/13/2019   Procedure: Insertion Of Dialysis Catheter;  Surgeon: Larina Earthly, MD;  Location: [facility] OR;  Service: Vascular;;   INSERTION OF DIALYSIS CATHETER Left 07/19/2020   Procedure: INSERTION OF DIALYSIS CATHETER;  Surgeon: Maeola Harman, MD;  Location: [facility] OR;  Service: Vascular;  Laterality: Left;   IRRIGATION AND DEBRIDEMENT ABSCESS     PERIPHERAL VASCULAR BALLOON ANGIOPLASTY  08/15/2019   Procedure: PERIPHERAL VASCULAR BALLOON ANGIOPLASTY;  Surgeon: Maeola Harman, MD;  Location: [facility] INVASIVE CV LAB;  Service: Cardiovascular;;   REVISON OF ARTERIOVENOUS FISTULA Left 07/19/2020   Procedure: REVISON/PLICATION OF ARTERIOVENOUS FISTULA LEFT;  Surgeon: Lemar Livings  Cristal Deer, MD;  Location: [facility] OR;  Service: Vascular;  Laterality: Left;   THROMBECTOMY AND REVISION OF ARTERIOVENTOUS (AV) GORETEX  GRAFT Left 08/13/2019   Procedure: THROMBECTOMY OF LEFT ARM ARTERIOVENTOUS (AV) FISTULA;  Surgeon: Larina Earthly, MD;  Location: [facility] OR;  Service: Vascular;  Laterality: Left;   TOE AMPUTATION      Allergies  Allergen Reactions   Sulfa Antibiotics Nausea And Vomiting    Prior to Admission medications   Medication Sig Start Date End Date Taking? Authorizing Provider  lidocaine-prilocaine  (EMLA) cream Apply 1 application topically every Monday, Wednesday, and Friday with hemodialysis.  08/17/18  Yes [provider]  sucroferric oxyhydroxide (VELPHORO) 500 MG chewable tablet Chew 1,000 mg by mouth 3 (three) times daily with meals.   Yes [provider]  TRESIBA FLEXTOUCH 100 UNIT/ML FlexTouch Pen Inject 30 Units into the skin daily. 07/16/20  Yes [provider]  famotidine (PEPCID) 20 MG tablet Take 1 tablet (20 mg total) by mouth at bedtime. Patient not taking: Reported on 02/02/2022 08/16/19   Esperanza Sheets, MD    Social History   Socioeconomic History   Marital status: Divorced    Spouse name: Not on file   Number of children: Not on file   Years of education: Not on file   Highest education level: Not on file  Occupational History   Not on file  Tobacco Use   Smoking status: Never   Smokeless tobacco: Never  Vaping Use   Vaping Use: Never used  Substance and Sexual Activity   Alcohol use: Not Currently   Drug use: Never   Sexual activity: Not on file  Other Topics Concern   Not on file  Social History Narrative   Not on file   Social Determinants of Health   Financial Resource Strain: Not on file  Food Insecurity: Not on file  Transportation Needs: Not on file  Physical Activity: Not on file  Stress: Not on file  Social Connections: Not on file  Intimate Partner Violence: Not on file     Family History  Problem Relation Age of Onset   Hypertension Mother     ROS: [x]  Positive   [ ]  Negative   [ ]  All sytems reviewed and are negative Unable to obtain from pt as he is sleepy after HD  Cardiac: []  chest pain/pressure []  dyspnea on exertion  Vascular: []  pain in legs while walking []  non-healing ulcers []  hx of DVT []  swelling in legs  Pulmonary: []  asthma/wheezing []  home O2  Neurologic: []  hx of CVA []  mini stroke   Hematologic: []  hx of cancer  Endocrine:   []  diabetes []  thyroid disease  GI []   GERD  GU: [x]  CKD/renal failure []  HD--[]  M/W/F or []  T/T/S  Psychiatric: []  anxiety []  depression  Musculoskeletal: []  arthritis []  joint pain  Integumentary: []  rashes []  ulcers  Constitutional: []  fever  []  chills   Physical Examination  Vitals:   02/05/22 1144 02/05/22 1211  BP: (!) 149/49   Pulse:    Resp: (!) 28   Temp:  98.5 F (36.9 C)  SpO2:     Body mass index is 35.46 kg/m.  General:  WDWN in NAD; lethargic after HD Gait: Not observed HENT: WNL, normocephalic Pulmonary: normal non-labored breathing Cardiac: regular Abdomen:  soft, NT/ND Skin:  no rashes Vascular Exam/Pulses: Left radial pulse is not palpable Extremities: +thrill in fistula and somewhat pulsatile; bandage in place from HD and removed.  There is discolored skin, but no evidence of ulceration.   Musculoskeletal: no muscle wasting or atrophy     CBC    Component Value Date/Time   WBC 11.3 (H) 02/05/2022 0144   RBC 3.62 (L) 02/05/2022 0144   HGB 11.2 (L) 02/05/2022 0144   HCT 33.4 (L) 02/05/2022 0144   PLT 220 02/05/2022 0144   MCV 92.3 02/05/2022 0144   MCH 30.9 02/05/2022 0144   MCHC 33.5 02/05/2022 0144   RDW 16.3 (H) 02/05/2022 0144   LYMPHSABS 0.9 02/02/2022 0107   MONOABS 1.9 (H) 02/02/2022 0107   EOSABS 0.0 02/02/2022 0107   BASOSABS 0.1 02/02/2022 0107    BMET    Component Value Date/Time   NA 132 (L) 02/05/2022 0144   K 3.6 02/05/2022 0144   CL 95 (L) 02/05/2022 0144   CO2 25 02/05/2022 0144   GLUCOSE 72 02/05/2022 0144   BUN 50 (H) 02/05/2022 0144   CREATININE 11.23 (H) 02/05/2022 0144   CALCIUM 8.8 (L) 02/05/2022 0144   GFRNONAA 5 (L) 02/05/2022 0144   GFRAA 3 (L) 08/14/2019 2051    COAGS: Lab Results  Component Value Date   INR 1.3 (H) 02/03/2022   INR 1.3 (H) 02/02/2022     Non-Invasive Vascular Imaging:   None   ASSESSMENT/PLAN: This is a 61 y.o. male with ESRD in need of evaluation of his dialysis access for thinning skin   -pt just  back from dialysis and unable to obtain any information from the pt.   -Dr. Myra Gianotti and I removed the bandages and there is no evidence of ulceration.  His fistula does feel somewhat pulsatile.  We will schedule him for fistulogram tomorrow. -npo after MN/consent   Doreatha Massed, PA-C [facility] [phone]   I agree with the above.  I have seen and evaluated the patient with Samantha Ryne.  Briefly this is a 61 year old gentleman with end-stage renal disease on dialysis via a left upper arm fistula.  He has previously undergone fistulogram with intervention.  Most recently in 2021 he underwent plication of an aneurysm by Dr. Randie Heinz.  We were asked to evaluate him for possible ulceration of his fistula.  On exam, I do not see any active ulcers however he does have thinning of the skin and discoloration.  There is a slight pulsatility to the fistula.  I think he would benefit from a fistulogram.  We will proceed with this tomorrow.  He will need to be n.p.o. after midnight.  He most recently saw Dr. Arbie Cookey on March 15 because of a blistering on the plantar surface of his foot over the lateral aspect.  It was felt that he had adequate blood flow, as he has palpable dorsalis pedis pulses.  Wound care was recommended.  Durene Cal

## 2022-02-05 NOTE — Progress Notes (Addendum)
STROKE TEAM PROGRESS NOTE  ? ?INTERVAL HISTORY ? ?Patient had a prior right fifth metatarsal amputation. He was seen on 3/15 Dr. Donnetta Hutching for evaluation of arterial flow in BLE.  2 to 3 months of blistering on the plantar surface of the foot over the lateral aspect related to pressure over the old metatarsal bone resection site that has been slow to heal.   ? ?Seen in dialysis, patient is lethargic.  He will turn his head to voice but does not open his eyes to command.  He did attempt to lift his extremities off of the bed left side weaker than right. ? ? ?Vitals:  ? 02/05/22 0400 02/05/22 0500 02/05/22 0600 02/05/22 0700  ?BP: (!) 116/56 (!) 85/74 131/62 106/75  ?Pulse: 70 69 70 69  ?Resp: (!) 28 11 (!) 24 (!) 29  ?Temp:      ?TempSrc:      ?SpO2: 97% 95% 100% 100%  ?Weight:  121.9 kg    ?Height:      ? ?CBC:  ?Recent Labs  ?Lab 02/02/22 ?0107 02/02/22 ?0920 02/04/22 ?0217 02/05/22 ?0144  ?WBC 12.0*   < > 11.0* 11.3*  ?NEUTROABS 9.1*  --   --   --   ?HGB 12.0*   < > 11.3* 11.2*  ?HCT 37.1*   < > 33.7* 33.4*  ?MCV 95.9   < > 92.3 92.3  ?PLT 171   < > 176 220  ? < > = values in this interval not displayed.  ? ?Basic Metabolic Panel:  ?Recent Labs  ?Lab 02/03/22 ?0037 02/03/22 ?0408 02/04/22 ?0217 02/05/22 ?0144  ?NA 131*  130* 128* 132* 132*  ?K 5.5*  5.3* 6.1* 3.7 3.6  ?CL 92*  91* 90* 93* 95*  ?CO2 23  23 18* 26 25  ?GLUCOSE 278*  282* 358* 216* 72  ?BUN 47*  48* 47* 35* 50*  ?CREATININE 13.10*  13.19* 13.14* 9.58* 11.23*  ?CALCIUM 9.0  8.9 8.7* 8.5* 8.8*  ?MG 1.6* 1.6*  --   --   ?PHOS 3.1 3.4  --   --   ? ?Lipid Panel: No results for input(s): CHOL, TRIG, HDL, CHOLHDL, VLDL, LDLCALC in the last 168 hours. ?HgbA1c:  ?Recent Labs  ?Lab 02/02/22 ?1618  ?HGBA1C 12.2*  ? ?Urine Drug Screen: No results for input(s): LABOPIA, COCAINSCRNUR, LABBENZ, AMPHETMU, THCU, LABBARB in the last 168 hours.  ?Alcohol Level  ?Recent Labs  ?Lab 02/02/22 ?0152  ?ETH <10  ? ? ?IMAGING past 24 hours ?MR BRAIN WO CONTRAST ? ?Result  Date: 02/04/2022 ?CLINICAL DATA:  Mental status change of unknown cause. EXAM: MRI HEAD WITHOUT CONTRAST TECHNIQUE: Multiplanar, multiecho pulse sequences of the brain and surrounding structures were obtained without intravenous contrast. COMPARISON:  CT studies done yesterday. FINDINGS: Brain: Diffusion imaging shows numerous punctate foci of restricted diffusion primarily affecting the deep white matter but also involvement of the splenium of the corpus callosum and a few small foci affecting the cortical and subcortical brain. The pattern could be due to micro embolic disease from the heart or ascending aorta or due to hypoperfusion. No large confluent infarction. No mass lesion, hemorrhage, hydrocephalus or extra-axial collection. Vascular: Major vessels at the base of the brain show flow. 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 Findings could be due to micro embolic disease from the heart or ascending aorta or could be  due to a global hypoperfusion event. Electronically Signed   By: Nelson Chimes M.D.   On: 02/04/2022 15:24  ? ?DG Abd Portable 1V ? ?Result Date: 02/04/2022 ?CLINICAL DATA:  MRI clearance EXAM: PORTABLE ABDOMEN - 1 VIEW COMPARISON:  None. FINDINGS: There is a nonobstructive bowel gas pattern. There is no gross organomegaly or abnormal soft tissue calcification. No radiopaque foreign body or surgical material is seen. The bones are unremarkable.  Vascular calcifications are noted. IMPRESSION: No radiopaque foreign body or surgical material identified. Electronically Signed   By: Valetta Mole M.D.   On: 02/04/2022 08:18   ? ?PHYSICAL EXAM ? ?Physical Exam  ?Constitutional: Appears well-developed and well-nourished.  ?Cardiovascular: Normal rate and regular rhythm.  ?Respiratory: Effort normal, non-labored  breathing ? ?Neuro: ?Mental Status: ?Patient is lethargic, awakens to noxious stimuli.  Responds to questions with grunts, but will follow attempt to follow commands ?No signs of aphasia or neglect ?Cranial Nerves: ?II: Pupils are equal, round, and reactive to light.   ?III,IV, VI: EOMI without ptosis or diploplia.  ?V: Facial sensation is symmetric to temperature ?VII: Facial movement is symmetric resting and smiling ?VIII: Hearing is intact to voice ?X: cough and gag intact ?XI: Head is midline ?XII: Tongue protrudes midline  ?Motor: ?Tone is normal. Bulk is normal.  ?RUE 4/5 LUE 2/5 ?RLE 4/5  LLE 2/5 ?Sensory: ?Sensation is symmetric to light touch and temperature in the arms and legs. No extinction to DSS present.  ? ?Unable to safely test coordination and gait due to him being connected to hemodialysis  ?  ? ?ASSESSMENT/PLAN ?Mr. KONOR NOREN Sr. is a 61 y.o. male with history of ESRD, HFmrEF, and T2DM presenting initially on 02/02/2022 fever, acute encephalopathy, sepsis, and right foot infection. Presented to [facility] and transferred to [facility].   ?Podiatry consulted, he was taken emergently to the OR.  Postoperatively he has had persistent lethargy and was taken to the ICU 3/19 evening.  He was placed on Narcan drip for presumed lethargy due to opioid use however he only got 5 mg of oxycodone.  Narcan drip was discontinued on 3/20 and he was transferred out of the ICU.  MRI shows numerous punctate acute infarcts scattered throughout both hemispheres, consider cardioembolic versus hypoperfusion as a source ? ?Stroke: Scattered punctate infarcts in bilateral cerebral hemispheres watershed distribution likely secondary hypoperfusion with intermittent hypotension ?Code Stroke CT head No acute abnormality.  Stable change and remote lacunar infarcts ?CTA head & neck 70% stenosis of the proximal right ICA ?CT venogram- normal ?MRI numerous punctate acute infarcts scattered throughout both cerebral hemispheres ?2D Echo EF 45  to 50% ?LDL 46 ?HgbA1c 12.2 ?VTE prophylaxis -subcu heparin ?No antithrombotic prior to admission, now on aspirin 81 mg daily.  Continue on discharge. ?Therapy recommendations: Pending ?Disposition: Pending ? ?? 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 ? ?Right carotid stenosis ?CT head and neck showed right ICA bulb 70% stenosis ?Likely incidental finding, asymptomatic ?Avoid low BP ?Follow-up with vascular surgery as outpatient ? ?Hypertension ?Intermittent hypotension ?Stable now blood intermittent hypotension in OR and ICU ?Avoid low BP ?Long-term BP goal normotensive ? ?Hyperlipidemia ?Home meds: None ?LDL 46 goal < 70 ?No statin needed at this time given LDL at goal ? ?Diabetes  type II Uncontrolled ?Home meds: Tyler Aas ?HgbA1c 12.2, goal < 7.0 ?CBGs ?SSI ?Close PCP follow-up for better DM control ? ?Other Stroke Risk Factors ?Advanced Age >/= 62  ?Congestive heart failure  ? ?Other Active Problems ?Nonhealing wound over the right foot ?Plain films of his foot indicate swelling and subcutaneous gas without any obvious bony erosion.   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 ?Acute metabolic encephalopathy ?Repeat I&D on hold until encephalopathy improves ?Anion gap is closed ? ?Lactic Acidosis ?3.1 -> 2.2 -> 2.5 -> 2.6 -> 2.5 ? ?ESRD ?Nephrology following ?Cr 9.58-> 11.23 ?Vascular surgery on board for assessment of fistula ?Plan for fistulogram tomorrow ? ?Hospital day # 3 ? ?Patient seen and examined by NP/APP with MD. MD to  update note as needed.  ? ?Janine Ores, DNP, FNP-BC ?[facility] ?Pager: [phone] ? ?ATTENDING NOTE: ?I reviewed above note and agree with the assessment and plan. Pt was seen and examined.  ? ?52

## 2022-02-05 NOTE — Progress Notes (Signed)
PT Cancellation Note ? ?Patient Details ?Name: [name] TAKAGI Sr. ?MRN: [id number] ?DOB: 04-27-1961 ? ? ?Cancelled Treatment:    Reason Eval/Treat Not Completed: (P) Patient at procedure or test/unavailable Pt is off floor for HD. Pt will follow back this afternoon as able. ? ?[name] [name] B. [name] PT, DPT ?[facility] ?Pager [phone] ?Office [phone] ? ? ? ?[place] ?02/05/2022, 8:46 AM ? ? ?

## 2022-02-05 NOTE — Progress Notes (Addendum)
?[facility] ?Progress Note  ? ?Subjective: Seen on HD. Grunts, did not open eyes to verbal or follow commands however when neuro NP arrived, he did wake up, follows simple commands.   ? ?Objective ?Vitals:  ? 02/05/22 0930 02/05/22 1000 02/05/22 1030 02/05/22 1100  ?BP: (!) 110/57 137/83 (!) 149/77 114/85  ?Pulse:      ?Resp: 15 (!) 22 (!) 25 (!) 29  ?Temp:      ?TempSrc:      ?SpO2:      ?Weight:      ?Height:      ? ?Physical Exam ?General: Chronically ill appearing male in NAD ?Neuro: Not speaking but follows some commands. Tracks and accomodates objects.  ?Heart: S1,S2 No M/R/G ?Lungs: CTAB, no WOB ?Abdomen: Obese, NT, ND ?Extremities:no LE edema ACE wrap R foot.  ?Dialysis Access: L AVF with thinning ulcerated area lower portion on AVF.  ? ?Additional Objective ?Labs: ?Basic Metabolic Panel: ?Recent Labs  ?Lab 02/03/22 ?0037 02/03/22 ?0408 02/04/22 ?0217 02/05/22 ?0144  ?NA 131*  130* 128* 132* 132*  ?K 5.5*  5.3* 6.1* 3.7 3.6  ?CL 92*  91* 90* 93* 95*  ?CO2 23  23 18* 26 25  ?GLUCOSE 278*  282* 358* 216* 72  ?BUN 47*  48* 47* 35* 50*  ?CREATININE 13.10*  13.19* 13.14* 9.58* 11.23*  ?CALCIUM 9.0  8.9 8.7* 8.5* 8.8*  ?PHOS 3.1 3.4  --   --   ? ?Liver Function Tests: ?Recent Labs  ?Lab 02/02/22 ?0604 02/03/22 ?0037 02/04/22 ?0217  ?AST 29 27  27 27   ?ALT 18 17  19 17   ?ALKPHOS 71 68  69 66  ?BILITOT 0.8 0.8  0.9 0.7  ?PROT 7.5 7.8  8.0 7.1  ?ALBUMIN 2.9* 2.6*  2.7* 2.2*  ? ?No results for input(s): LIPASE, AMYLASE in the last 168 hours. ?CBC: ?Recent Labs  ?Lab 02/02/22 ?0107 02/02/22 ?0920 02/03/22 ?0037 02/03/22 ?0408 02/04/22 ?0217 02/05/22 ?0144  ?WBC 12.0* 12.4* 13.6* 11.6* 11.0* 11.3*  ?NEUTROABS 9.1*  --   --   --   --   --   ?HGB 12.0* 11.8* 12.3* 11.6* 11.3* 11.2*  ?HCT 37.1* 37.0* 38.8* 36.2* 33.7* 33.4*  ?MCV 95.9 96.1 95.3 94.3 92.3 92.3  ?PLT 171 155 160 150 176 220  ? ?Blood Culture ?   ?Component Value Date/Time  ? SDES WOUND 02/02/2022 1237  ? SPECREQUEST RIGHT FOOT  WOUND [place] A 02/02/2022 1237  ? CULT  02/02/2022 1237  ?  MODERATE MORGANELLA MORGANII ?FEW ENTEROCOCCUS FAECALIS ?HOLDING FOR POSSIBLE ANAEROBE ?Performed at [facility], [place] [address], [place], [place] [address] ?  ? REPTSTATUS PENDING 02/02/2022 1237  ? ? ?Cardiac Enzymes: ?No results for input(s): CKTOTAL, CKMB, CKMBINDEX, TROPONINI in the last 168 hours. ?CBG: ?Recent Labs  ?Lab 02/04/22 ?3300 02/04/22 ?0709 02/04/22 ?1239 02/04/22 ?1550 02/05/22 ?0654  ?GLUCAP 223* 197* 188* 174* 92  ? ?Iron Studies: No results for input(s): IRON, TIBC, TRANSFERRIN, FERRITIN in the last 72 hours. ?@lablastinr3 @ ?Studies/Results: ?MR BRAIN WO CONTRAST ? ?Result Date: 02/04/2022 ?CLINICAL DATA:  Mental status change of unknown cause. EXAM: MRI HEAD WITHOUT CONTRAST TECHNIQUE: Multiplanar, multiecho pulse sequences of the brain and surrounding structures were obtained without intravenous contrast. COMPARISON:  CT studies done yesterday. FINDINGS: Brain: Diffusion imaging shows numerous punctate foci of restricted diffusion primarily affecting the deep white matter but also involvement of the splenium of the corpus callosum and a few small foci affecting the cortical and  subcortical brain. The pattern could be due to micro embolic disease from the heart or ascending aorta or due to hypoperfusion. No large confluent infarction. No mass lesion, hemorrhage, hydrocephalus or extra-axial collection. Vascular: Major vessels at the base of the brain show flow. 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 Findings could be due to micro embolic disease from the heart or ascending aorta or could be due to a global hypoperfusion event. Electronically Signed   By: Nelson Chimes M.D.   On: 02/04/2022 15:24  ? ?DG Abd Portable 1V ? ?Result Date:  02/04/2022 ?CLINICAL DATA:  MRI clearance EXAM: PORTABLE ABDOMEN - 1 VIEW COMPARISON:  None. FINDINGS: There is a nonobstructive bowel gas pattern. There is no gross organomegaly or abnormal soft tissue calcification. No radiopaque foreign body or surgical material is seen. The bones are unremarkable.  Vascular calcifications are noted. IMPRESSION: No radiopaque foreign body or surgical material identified. Electronically Signed   By: Valetta Mole M.D.   On: 02/04/2022 08:18   ?Medications: ? sodium chloride    ? sodium chloride    ? ceFEPime (MAXIPIME) IV Stopped (02/03/22 2010)  ? sodium chloride    ? vancomycin 1,000 mg (02/05/22 1033)  ? ?  stroke: mapping our early stages of recovery book   Does not apply Once  ? (feeding supplement) PROSource Plus  30 mL Oral BID BM  ? vitamin C  500 mg Oral Daily  ? aspirin EC  81 mg Oral Daily  ? Chlorhexidine Gluconate Cloth  6 each Topical Q0600  ? famotidine  20 mg Oral Daily  ? feeding supplement (NEPRO CARB STEADY)  237 mL Oral BID BM  ? heparin  5,000 Units Subcutaneous Q8H  ? insulin aspart  0-20 Units Subcutaneous Q4H  ? insulin glargine-yfgn  20 Units Subcutaneous Daily  ? metroNIDAZOLE  500 mg Oral Q12H  ? multivitamin  1 tablet Oral QHS  ? mupirocin ointment  1 application. Nasal BID  ? pantoprazole  40 mg Oral Q1200  ? sucroferric oxyhydroxide  1,000 mg Oral TID WC  ? thiamine injection  100 mg Intravenous Daily  ? Or  ? thiamine  100 mg Oral Daily  ? zinc sulfate  220 mg Oral BID  ? ? ? ?Dialysis Orders:  MWF [facility] ?4h 65min  450/500    117.8kg   2/ 2.5Ca bath  Hep 6000  L AVF ? - on 3/19 > hep B Ag neg, and Hep B Ab's high/ protective ? - calcitriol 1.78mcg PO qHD-last 01/31/22 ?  ?Assessment/Plan: ?Osteomyelitis-Podiatry following, on IV vanc/cefepime/Flagyl; ID following. 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  ?Acute metabolic encephalopathy: Neurology consulted. MRI done 02/04/2022 Numerous punctate acute infarctions throughout  cerebral hemispheres. Per primary.  ?Thinning, ulcerated area lower portion of AVF: Stick away from area. Ask [facility] to see pt while in hospital.  ?ESRD - on HD MWF. HD today. Use 4.0 K bath.  ?Hypertension/volume  - Blood pressures acceptable; euvolemic on exam. UF as tolerated. Avoid hypotension during HD. Keep SBP >120.  ?Anemia of CKD - Hgb 11.2; No ESA/Fe needs at this time ?Secondary Hyperparathyroidism -  CCa okay. Continue VDRA with HD. Phos in range lower part.  ?Nutrition - Advance to renal diet with fluid restriction when more awake ?  ? ?Arch Methot H. Kwaku Mostafa NP-C ?02/05/2022, 11:25 AM  ?  [facility] ?[phone] ? ? ?  ? ?

## 2022-02-05 NOTE — Progress Notes (Addendum)
?   ?I have seen and examined the patient. I have personally reviewed the clinical findings, laboratory findings, microbiological data and imaging studies. The assessment and treatment plan was discussed with the Nurse Practitioner Janene Madeira. I agree with her/his recommendations except following additions/corrections. ? ?Seen in HD, responds to voice but grunts to other questions ? ?MRI brain with Numerous punctate acute infarctions scattered throughout both cerebral hemispheres. ?Neurology consulted - likely cardio embolic, stroke work up in process ( TTE and CTA head/neck) ? ?Large blister evacuated with murky malodorous brown fluid by Podiatry 3/21 ? ?3/19 blood cx 1/2 sets [name] [name] ( GPC in diplos and GNR in gram stain)- likely 2.2 bacteremia from infected rt foot wound. Low suspicion for endocarditis. Fu TTE ? ?Continue Vancomycin, pharmacy to dose, cefepime and metronidazole ?Fu pending blood and ORcultures  ?Fu TTE ?Fu Podiatry recs - plan for repeat debridement on hold due to new CVA ?Fu Neurology recs ?Following  ? ?[name] Oz, MD ?Infectious Disease Physician ?[name] ?[name] ?[place], [place] [address] ?Phone: [phone]  Fax: [phone] ? ? ? ? ?[name] ? ?Date of Admission:  02/02/2022    ? ? ?Total days of antibiotics 3  ? Vanc ? Cefepime  ?        ? ?ASSESSMENT: ?[name] [name] Sr. is a 61 y.o. male here with acute encephalopathy in the setting of infection of right foot. 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 Large blister evacuated 3/21 with murky malodorous brown fluid.  ?Podiatry's plan is to allow for improvement of encephalopathy and then proceed with repeat debridement with VAC placement.  ?Wound cultures as expected are polymicrobial - [name] [name], enterococcus faecalis and now what appears to be an anaerobe is growing. No  changes to current broad spectrum coverage with vancomycin, cefepime and metronidazole.  ? 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 Neurology consulted to help with care. TTE pending.  ? ?Final cultures are still pending as is repeat surgery date.  Will continue to follow intermittently during inpatient stay. Anticipate 6 weeks post-HD antibiotics from last debridement.  ? ? ?PLAN: ?Continue IV cefepime and vancomycin post HD ?Follow for podiatry surgery plan  ?Follow TTE ? ? ?Principal Problem: ?  Osteomyelitis ([place]) ?Active Problems: ?  ESRD (end stage renal disease) ([place]) ?  DM2 (diabetes mellitus, type 2) ([place]) ?  HTN (hypertension) ?  Chronic systolic CHF (congestive heart failure) ([place]) ?  Acute metabolic encephalopathy ?  Severe sepsis ([place]) ?  Acute respiratory failure with hypoxia ([place]) ?  Protein-calorie malnutrition, mild ([place]) ?  GERD (gastroesophageal reflux disease) ?  Hyponatremia ?  Hyperkalemia ?  High anion gap metabolic acidosis ?  Anemia of chronic renal failure ?  Necrotizing soft tissue infection ?  Diabetic foot infection ([place]) ?  Bacteremia ? ? ?  stroke: mapping our early stages of recovery book   Does not apply Once  ? (feeding supplement) PROSource Plus  30 mL Oral BID BM  ? vitamin C  500 mg Oral Daily  ? aspirin EC  81 mg Oral Daily  ? Chlorhexidine Gluconate Cloth  6 each Topical Q0600  ? famotidine  20  mg Oral Daily  ? feeding supplement (NEPRO CARB STEADY)  237 mL Oral BID BM  ? heparin  5,000 Units Subcutaneous Q8H  ? insulin aspart  0-20 Units Subcutaneous Q4H  ? insulin glargine-yfgn  20 Units Subcutaneous Daily  ? metroNIDAZOLE  500 mg Oral Q12H  ? multivitamin  1 tablet Oral QHS  ? mupirocin ointment  1 application. Nasal BID  ? pantoprazole  40  mg Oral Q1200  ? sucroferric oxyhydroxide  1,000 mg Oral TID WC  ? thiamine injection  100 mg Intravenous Daily  ? Or  ? thiamine  100 mg Oral Daily  ? zinc sulfate  220 mg Oral BID  ? ? ?SUBJECTIVE: ?Seen on dialysis this morning.  ? ? ?Review of Systems: ?Review of Systems  ?Unable to perform ROS: Patient nonverbal  ? ?Allergies  ?Allergen Reactions  ? Sulfa Antibiotics Nausea And Vomiting  ? ? ?OBJECTIVE: ?Vitals:  ? 02/05/22 0830 02/05/22 0900 02/05/22 0930 02/05/22 1000  ?BP: 118/64 130/67 (!) 110/57 137/83  ?Pulse:      ?Resp: (!) 29 (!) 28 15 (!) 22  ?Temp:      ?TempSrc:      ?SpO2:      ?Weight:      ?Height:      ? ?Body mass index is 35.46 kg/m?. ? ?Physical Exam ?Constitutional:   ?   Appearance: He is well-developed.  ?   Comments: Resting quietly in bed undergoing hemodialysis  ?HENT:  ?   Mouth/Throat:  ?   Mouth: Mucous membranes are dry.  ?   Dentition: Normal dentition. No dental abscesses.  ?Eyes:  ?   Conjunctiva/sclera: Conjunctivae normal.  ?   Pupils: Pupils are equal, round, and reactive to light.  ?Cardiovascular:  ?   Rate and Rhythm: Normal rate and regular rhythm.  ?   Heart sounds: Normal heart sounds.  ?Pulmonary:  ?   Effort: Pulmonary effort is normal.  ?   Breath sounds: Normal breath sounds.  ?Abdominal:  ?   General: There is no distension.  ?   Palpations: Abdomen is soft.  ?   Tenderness: There is no abdominal tenderness.  ?Musculoskeletal:  ?   Comments: Rt foot wrapped in clean OR dressing  ?Lymphadenopathy:  ?   Cervical: No cervical adenopathy.  ?Skin: ?   General: Skin is warm and dry.  ?   Findings: No rash.  ?Neurological:  ?   Mental Status: He is alert.  ? ? ?Lab Results ?Lab Results  ?Component Value Date  ? WBC 11.3 (H) 02/05/2022  ? HGB 11.2 (L) 02/05/2022  ? HCT 33.4 (L) 02/05/2022  ? MCV 92.3 02/05/2022  ? PLT 220 02/05/2022  ?  ?Lab Results  ?Component Value Date  ? CREATININE 11.23 (H) 02/05/2022  ? BUN 50 (H) 02/05/2022  ? NA 132 (L) 02/05/2022  ? K 3.6  02/05/2022  ? CL 95 (L) 02/05/2022  ? CO2 25 02/05/2022  ?  ?Lab Results  ?Component Value Date  ? ALT 17 02/04/2022  ? AST 27 02/04/2022  ? ALKPHOS 66 02/04/2022  ? BILITOT 0.7 02/04/2022  ?  ? ?Microbiology: ?Recent Results (from the past 240 hour(s))  ?Blood Culture (routine x 2)     Status: None (Preliminary result)  ? Collection Time: 02/02/22  1:50 AM  ? Specimen: BLOOD RIGHT ARM  ?Result Value Ref Range Status  ? Specimen Description   Final  ?  BLOOD RIGHT ARM ?Performed at [facility], [address], [place], [place] [address] ?  ? Special Requests   Final  ?  BOTTLES DRAWN AEROBIC AND ANAEROBIC Blood Culture adequate volume ?Performed at [facility], [address] [place], [place] [address] ?  ? Culture  Setup Time   Final  ?  ANAEROBIC BOTTLE ONLY ?GRAM POSITIVE COCCI IN DIPLOS ?Gram Stain Report Called to,Read Back By and Verified With: RACHEL PUCKETT @ [phone] ON 02/04/22 C VARNER ?[place] ?CRITICAL RESULT CALLED TO, READ BACK BY AND VERIFIED WITH: PHARMD E.UELAND AT [phone] ON 02/04/2022 BY T.SAAD. ?Performed at [facility], [place] [address], [place], [place] [address] ?  ? Culture   Final  ?  NO GROWTH 3 DAYS ?Performed at [facility], [address], [place], [place] [address] ?  ? Report Status PENDING  Incomplete  ?Blood Culture ID Panel (Reflexed)     Status: Abnormal  ? Collection Time: 02/02/22  1:50 AM  ?Result Value Ref Range Status  ? Enterococcus faecalis NOT DETECTED NOT DETECTED Final  ? Enterococcus Faecium NOT DETECTED NOT DETECTED Final  ? Listeria monocytogenes NOT DETECTED NOT DETECTED Final  ? Staphylococcus species NOT DETECTED NOT DETECTED Final  ? Staphylococcus aureus (BCID) NOT DETECTED NOT DETECTED Final  ? Staphylococcus epidermidis NOT DETECTED NOT DETECTED Final  ? Staphylococcus lugdunensis NOT DETECTED NOT DETECTED Final  ? Streptococcus species NOT DETECTED NOT DETECTED Final  ? Streptococcus agalactiae NOT DETECTED NOT DETECTED Final  ? Streptococcus  pneumoniae NOT DETECTED NOT DETECTED Final  ? Streptococcus pyogenes NOT DETECTED NOT DETECTED Final  ? A.calcoaceticus-baumannii NOT DETECTED NOT DETECTED Final  ? Bacteroides fragilis NOT DETECTED NOT DETECTED Final  ?

## 2022-02-05 NOTE — Progress Notes (Signed)
? ?  Echocardiogram ?2D Echocardiogram has been performed. ? ?[name] [name] ?02/05/2022, 3:46 PM ?

## 2022-02-06 ENCOUNTER — Inpatient Hospital Stay (HOSPITAL_COMMUNITY): Payer: Medicare Other

## 2022-02-06 ENCOUNTER — Encounter (HOSPITAL_COMMUNITY): Payer: Self-pay | Admitting: Vascular Surgery

## 2022-02-06 ENCOUNTER — Inpatient Hospital Stay (HOSPITAL_COMMUNITY)
Admission: EM | Disposition: A | Discharge status: Skilled Nursing Facility | Payer: Self-pay | Source: Home / Self Care | Attending: Internal Medicine

## 2022-02-06 DIAGNOSIS — L089 Local infection of the skin and subcutaneous tissue, unspecified: Secondary | ICD-10-CM

## 2022-02-06 DIAGNOSIS — R7881 Bacteremia: Secondary | ICD-10-CM | POA: Diagnosis not present

## 2022-02-06 DIAGNOSIS — M86171 Other acute osteomyelitis, right ankle and foot: Secondary | ICD-10-CM | POA: Diagnosis not present

## 2022-02-06 DIAGNOSIS — E1169 Type 2 diabetes mellitus with other specified complication: Secondary | ICD-10-CM | POA: Diagnosis not present

## 2022-02-06 DIAGNOSIS — T82898A Other specified complication of vascular prosthetic devices, implants and grafts, initial encounter: Secondary | ICD-10-CM | POA: Diagnosis not present

## 2022-02-06 DIAGNOSIS — M869 Osteomyelitis, unspecified: Secondary | ICD-10-CM | POA: Diagnosis not present

## 2022-02-06 DIAGNOSIS — N186 End stage renal disease: Secondary | ICD-10-CM | POA: Diagnosis not present

## 2022-02-06 DIAGNOSIS — G9340 Encephalopathy, unspecified: Secondary | ICD-10-CM | POA: Diagnosis not present

## 2022-02-06 DIAGNOSIS — Z992 Dependence on renal dialysis: Secondary | ICD-10-CM | POA: Diagnosis not present

## 2022-02-06 HISTORY — PX: PERIPHERAL VASCULAR BALLOON ANGIOPLASTY: CATH118281

## 2022-02-06 LAB — BASIC METABOLIC PANEL
Anion gap: 12 (ref 5–15)
BUN: 34 mg/dL — ABNORMAL HIGH (ref 6–20)
CO2: 25 mmol/L (ref 22–32)
Calcium: 8.6 mg/dL — ABNORMAL LOW (ref 8.9–10.3)
Chloride: 96 mmol/L — ABNORMAL LOW (ref 98–111)
Creatinine, Ser: 8.57 mg/dL — ABNORMAL HIGH (ref 0.61–1.24)
GFR, Estimated: 7 mL/min — ABNORMAL LOW (ref >60)
Glucose, Bld: 190 mg/dL — ABNORMAL HIGH (ref 70–99)
Potassium: 3.9 mmol/L (ref 3.5–5.1)
Sodium: 133 mmol/L — ABNORMAL LOW (ref 135–145)

## 2022-02-06 LAB — GLUCOSE, CAPILLARY
Glucose-Capillary: 114 mg/dL — ABNORMAL HIGH (ref 70–99)
Glucose-Capillary: 128 mg/dL — ABNORMAL HIGH (ref 70–99)
Glucose-Capillary: 148 mg/dL — ABNORMAL HIGH (ref 70–99)
Glucose-Capillary: 155 mg/dL — ABNORMAL HIGH (ref 70–99)
Glucose-Capillary: 189 mg/dL — ABNORMAL HIGH (ref 70–99)
Glucose-Capillary: 194 mg/dL — ABNORMAL HIGH (ref 70–99)
Glucose-Capillary: 199 mg/dL — ABNORMAL HIGH (ref 70–99)

## 2022-02-06 LAB — CBC
HCT: 34.2 % — ABNORMAL LOW (ref 39.0–52.0)
Hemoglobin: 11.3 g/dL — ABNORMAL LOW (ref 13.0–17.0)
MCH: 30.6 pg (ref 26.0–34.0)
MCHC: 33 g/dL (ref 30.0–36.0)
MCV: 92.7 fL (ref 80.0–100.0)
Platelets: 247 10*3/uL (ref 150–400)
RBC: 3.69 MIL/uL — ABNORMAL LOW (ref 4.22–5.81)
RDW: 16.5 % — ABNORMAL HIGH (ref 11.5–15.5)
WBC: 10.2 10*3/uL (ref 4.0–10.5)
nRBC: 3.7 % — ABNORMAL HIGH (ref 0.0–0.2)

## 2022-02-06 SURGERY — PERIPHERAL VASCULAR BALLOON ANGIOPLASTY
Anesthesia: LOCAL | Laterality: Left

## 2022-02-06 MED ORDER — HEPARIN (PORCINE) IN NACL 1000-0.9 UT/500ML-% IV SOLN
INTRAVENOUS | Status: AC
  Filled 2022-02-06: qty 1000

## 2022-02-06 MED ORDER — HEPARIN (PORCINE) IN NACL 1000-0.9 UT/500ML-% IV SOLN
INTRAVENOUS | Status: DC | PRN
  Administered 2022-02-06: 500 mL

## 2022-02-06 MED ORDER — LIDOCAINE HCL (PF) 1 % IJ SOLN
INTRAMUSCULAR | Status: AC
  Filled 2022-02-06: qty 30

## 2022-02-06 MED ORDER — HEPARIN SODIUM (PORCINE) 1000 UNIT/ML IJ SOLN
INTRAMUSCULAR | Status: DC | PRN
  Administered 2022-02-06: 3000 [IU] via INTRAVENOUS

## 2022-02-06 MED ORDER — LIDOCAINE HCL (PF) 1 % IJ SOLN
INTRAMUSCULAR | Status: DC | PRN
  Administered 2022-02-06: 2 mL

## 2022-02-06 MED ORDER — HEPARIN SODIUM (PORCINE) 1000 UNIT/ML IJ SOLN
INTRAMUSCULAR | Status: AC
  Filled 2022-02-06: qty 10

## 2022-02-06 MED ORDER — IODIXANOL 320 MG/ML IV SOLN
INTRAVENOUS | Status: DC | PRN
Start: 2022-02-06 — End: 2022-02-06
  Administered 2022-02-06: 135 mL

## 2022-02-06 SURGICAL SUPPLY — 17 items
BAG SNAP BAND KOVER 36X36 (MISCELLANEOUS) ×2
BALLN LUTONIX AV 12X40X75 (BALLOONS) ×2
BALLN MUSTANG 10.0X40 75 (BALLOONS) ×2
BALLN MUSTANG 12.0X40 75 (BALLOONS) ×2
BALLN MUSTANG 8.0X40 75 (BALLOONS) ×2
CATH ANGIO 5F BER2 65CM (CATHETERS) ×2
COVER DOME SNAP 22 D (MISCELLANEOUS) ×2
KIT MICROPUNCTURE NIT STIFF (SHEATH) ×2
MAT PREVALON FULL STRYKER (MISCELLANEOUS) ×2
PROTECTION STATION PRESSURIZED (MISCELLANEOUS) ×2
SHEATH PINNACLE R/O II 7F 4CM (SHEATH) ×4
SHEATH PROBE COVER 6X72 (BAG) ×2
STOPCOCK MORSE 400PSI 3WAY (MISCELLANEOUS) ×2
TRAY PV CATH (CUSTOM PROCEDURE TRAY) ×2
TUBING CIL FLEX 10 FLL-RA (TUBING) ×2
WIRE BENTSON .035X145CM (WIRE) ×2
WIRE ROSEN-J .035X180CM (WIRE) ×2

## 2022-02-06 NOTE — Progress Notes (Signed)
[facility] ? ?Subjective  - no acute events. ? ? ?Objective ?(!) 100/52 ?71 ?99.7 ?F (37.6 ?C) (Oral) ?12 ?100% ? ?Intake/Output Summary (Last 24 hours) at 02/06/2022 1029 ?Last data filed at 02/05/2022 2200 ?Gross per 24 hour  ?Intake 300.06 ml  ?Output 1800 ml  ?Net -1499.94 ml  ? ? ?Left arm AV fistula ? ?Laboratory ?Lab Results: ?Recent Labs  ?  02/05/22 ?0144 02/06/22 ?0132  ?WBC 11.3* 10.2  ?HGB 11.2* 11.3*  ?HCT 33.4* 34.2*  ?PLT 220 247  ? ?BMET ?Recent Labs  ?  02/05/22 ?0144 02/06/22 ?0132  ?NA 132* 133*  ?K 3.6 3.9  ?CL 95* 96*  ?CO2 25 25  ?GLUCOSE 72 190*  ?BUN 50* 34*  ?CREATININE 11.23* 8.57*  ?CALCIUM 8.8* 8.6*  ? ? ?COAG ?Lab Results  ?Component Value Date  ? INR 1.3 (H) 02/03/2022  ? INR 1.3 (H) 02/02/2022  ? ?No results found for: PTT ? ?Assessment/Planning: ? ?Plan left upper extremity fistulogram.  Patient has had previous left upper extremity intervention and most recently underwent revision with plication by Dr. Donzetta Matters on 07/19/2020.  Seen in consultation yesterday by Dr. Trula Slade. ? ?[name] ?02/06/2022 ?10:29 AM ?-- ? ? ?

## 2022-02-06 NOTE — Progress Notes (Signed)
Speech Language Pathology Treatment: Dysphagia  ?Patient Details ?Name: BUSTER SCHUELLER Sr. ?MRN: [id number] ?DOB: 06/09/1961 ?Today's Date: 02/06/2022 ?Time: 4193-7902 ?SLP Time Calculation (min) (ACUTE ONLY): 15 min ? ?Assessment / Plan / Recommendation ?Clinical Impression ? RN asked for SLP to return as he was allowed to resume intake of solids post-procedure. SLP provided regular solids, purees, and thin liquids, all of which was self-fed by pt without overt signs of aspiration or dysphagia. He was however very distractible throughout intake and benefited from frequent redirection. Note that his processing and responses were not delayed this afternoon as they had been this morning. Recommend continuing with regular solids and thin liquids. SLP will f/u only for cognition. ?  ?HPI HPI: Patient is 61 year old African-American male past medical history of end-stage renal disease hemodialysis chronic systolic heart failure type 2 diabetes admitted to [facility] 2 days ago with acute encephalopathy secondary to infection osteomyelitis in the right foot.  He was taken to the OR with debridement he was started on vancomycin cefepime Flagyl and overnight he started to have low blood pressure on the altered mental status minimal response to Narcan. Pt had been given pain medication. Swallow eval on 3/20 recommended regular solids and thin liquids with assistance needed due to cognitive impact on swallowing. MRI 3/21 revealed numerous punctate acute infarctions scattered throughout both cerebral hemispheres. ?  ?   ?SLP Plan ? Continue with current plan of care ? ?  ?  ?Recommendations for follow up therapy are one component of a multi-disciplinary discharge planning process, led by the attending physician.  Recommendations may be updated based on patient status, additional functional criteria and insurance authorization. ?  ? ?Recommendations  ?Diet recommendations: Regular;Thin liquid ?Liquids provided via:  Cup;Straw ?Medication Administration: Whole meds with liquid ?Supervision: Patient able to self feed;Intermittent supervision to cue for compensatory strategies ?Compensations: Slow rate;Small sips/bites;Minimize environmental distractions ?Postural Changes and/or Swallow Maneuvers: Seated upright 90 degrees  ?   ?    ?   ? ? ? ? Oral Care Recommendations: Oral care BID ?Follow Up Recommendations: Skilled nursing-short term rehab (<3 hours/day) ?Assistance recommended at discharge: Intermittent Supervision/Assistance ?SLP Visit Diagnosis: Cognitive communication deficit (R41.841) ?Plan: Continue with current plan of care ? ? ? ? ?  ?  ? ? ?Osie Bond., M.A. CCC-SLP ?Acute Rehabilitation Services ?Pager [phone] ?Office [phone] ? ?02/06/2022, 4:07 PM ?

## 2022-02-06 NOTE — Progress Notes (Signed)
EEG completed, results pending. 

## 2022-02-06 NOTE — Progress Notes (Addendum)
Pt seen in consult yesterday.  Dr. Trula Slade did not feel there were any ulcers but discoloration of the skin.  The fistula was somewhat pulsatile and he felt he would benefit from a fistulogram.  Consent was not ordered as pt was unable to participate as he was sleeping after dialysis.   ? ?Pt on phone with daughter this morning and discussed fistulogram.  He is agreeable and he or his daughter do not have any questions.   ? ?Consent order placed. ? ? ?Leontine Locket, PAC ?02/06/2022 ?8:40 AM ? ?

## 2022-02-06 NOTE — Progress Notes (Signed)
Nutrition Follow-up ? ?DOCUMENTATION CODES:  ? ?Not applicable ? ?INTERVENTION:  ? ?- Continue Nepro Shake po BID between meals, each supplement provides 425 kcal and 19 grams protein ?  ?- Continue ProSource Plus po BID, each supplement provides 100 kcal and 15 grams of protein ?  ?- Continue renal MVI daily ?  ?- Continue zinc sulfate 220 mg BID x 14 days total and vitamin C 500 mg daily to aid in wound healing ? ?- Encourage PO intake ? ?NUTRITION DIAGNOSIS:  ? ?Increased nutrient needs related to wound healing, chronic illness (CHF, ESRD) as evidenced by estimated needs. ? ?Ongoing, being addressed via oral nutrition supplements ? ?GOAL:  ? ?Patient will meet greater than or equal to 90% of their needs ? ?Progressing ? ?MONITOR:  ? ?PO intake, Supplement acceptance, Labs, Weight trends, Skin, I & O's ? ?REASON FOR ASSESSMENT:  ? ?Consult ?Wound healing ? ?ASSESSMENT:  ? ?61 year old male who presented to the ED on 3/19 with AMS. PMH of ESRD on HD, T2DM, HTN, CHF, myocardial infarction, nonischemic cardiomyopathy. Pt admitted with sepsis secondary to osteomyelitis. ? ?03/19 - s/p I&D R foot and revision of fifth ray amputation ?03/23 - s/p L brachiocephalic arteriovenous fistula cannulation, L arm fistulogram, L central angioplasty, L peripheral angioplasty ? ?Discussed pt with RN and during ICU rounds. ? ?Last iHD was on 3/22 with 1800 ml net UF. Current weight is consistent with EDW. ? ?Spoke with RN who reports SLP is on the way to reevaluate pt now that procedure is over. Per earlier SLP note, pt okay for regular textures and thin liquids. RN reports pt is very hungry and wants to eat. RD will continue current oral nutrition supplements as well as daily renal MVI. ? ?Pt currently with renal diet ordered. If PO intake is poor, will consider liberalizing diet to Regular. Only 2 meal completions documented at this time: 15% x 1 meal on 3/21 and 75% x 1 meal on 3/21. ? ?EDW: 117.8 kg ?Admit weight: 121.1  kg ?Current weight: 117.7 kg ? ?Meal Completion: 15-75% ? ?Medications reviewed and include: ProSource Plus BID, vitamin C 500 mg daily, pepcid, Nepro Shake BID, SSI q 4 hours, semglee 20 units daily, renavit, protonix, velphoro TID with meals, thiamine, zinc sulfate 220 mg BID, IV abx ? ?Labs reviewed: sodium 133, TG 312 ?CBG's: 125-199 x 24 hours ? ?I/O's: +488 ml since admit ? ?Diet Order:   ?Diet Order   ? ?       ?  Diet renal with fluid restriction Fluid restriction: 1200 mL Fluid; Room service appropriate? Yes; Fluid consistency: Thin  Diet effective now       ?  ? ?  ?  ? ?  ? ? ?EDUCATION NEEDS:  ? ?Education needs have been addressed ? ?Skin:  Skin Assessment:: ?Skin Integrity Issues: ?Diabetic Ulcer: R foot ? ?Last BM:  02/01/22 ? ?Height:  ? ?Ht Readings from Last 1 Encounters:  ?02/02/22 6\' 1"  (1.854 m)  ? ? ?Weight:  ? ?Wt Readings from Last 1 Encounters:  ?02/06/22 117.7 kg  ? ? ?BMI:  Body mass index is 34.23 kg/m?. ? ?Estimated Nutritional Needs:  ? ?Kcal:  2200-2400 ? ?Protein:  115-130 grams ? ?Fluid:  1000 ml + UOP ? ? ? ?Gustavus Bryant, MS, RD, LDN ?Inpatient Clinical Dietitian ?Please see AMiON for contact information. ? ?

## 2022-02-06 NOTE — Evaluation (Signed)
Clinical/Bedside Swallow Evaluation ?Patient Details  ?Name: [name] MILHOUSE Sr. ?MRN: [id number] ?Date of Birth: September 19, 1961 ? ?Today's Date: 02/06/2022 ?Time: SLP Start Time (ACUTE ONLY): [id number] SLP Stop Time (ACUTE ONLY): 0940 ?SLP Time Calculation (min) (ACUTE ONLY): 14 min ? ?Past Medical History:  ?Past Medical History:  ?Diagnosis Date  ? CHF (congestive heart failure) ([place])   ? Diabetes mellitus without complication ([place])   ? type 2  ? ESRD on hemodialysis ([place])   ? mon wed fri  ? Hypertension   ? Kidney failure   ? Myocardial infarction [facility])   ? Nonischemic cardiomyopathy ([name])   ? ?Past Surgical History:  ?Past Surgical History:  ?Procedure Laterality Date  ? A/V FISTULAGRAM N/A 08/15/2019  ? Procedure: A/V FISTULAGRAM;  Surgeon: [name], MD;  Location: [facility];  Service: Cardiovascular;  Laterality: N/A;  ? AMPUTATION Right 02/02/2022  ? Procedure: AMPUTATION 5th RAY;  Surgeon: [name], MD;  Location: [facility];  Service: Podiatry;  Laterality: Right;  ? AV FISTULA PLACEMENT    ? CARDIAC CATHETERIZATION  02/17/2018  ? LHC 02/17/18 ([name]): 30% pLAD, 30% dRCA, LVEDP 25-30, LVEF 25%.    ? CATARACT EXTRACTION W/PHACO Right 03/29/2020  ? Procedure: CATARACT EXTRACTION PHACO AND INTRAOCULAR LENS PLACEMENT ([place])  RIGHT DIABETIC;  Surgeon: [name], MD;  Location: [facility];  Service: Ophthalmology;  Laterality: Right;  Lot # [id number] ?[place]: 02:30.9 ?AP% 9.5% ?CDE: 14.38  ? EYE SURGERY Right   ? cataract removed  ? I & D EXTREMITY Right 02/02/2022  ? Procedure: IRRIGATION AND DEBRIDEMENT RIGHT FOOT;  Surgeon: [name], MD;  Location: [facility];  Service: Podiatry;  Laterality: Right;  ? INSERTION OF DIALYSIS CATHETER  08/13/2019  ? Procedure: Insertion Of Dialysis Catheter;  Surgeon: [name], MD;  Location: [facility] OR;  Service: Vascular;;  ? INSERTION OF DIALYSIS CATHETER Left 07/19/2020  ? Procedure: INSERTION OF DIALYSIS CATHETER;  Surgeon: [name], MD;  Location: [facility];  Service: Vascular;  Laterality: Left;  ? IRRIGATION AND DEBRIDEMENT ABSCESS    ? PERIPHERAL VASCULAR BALLOON ANGIOPLASTY  08/15/2019  ? Procedure: PERIPHERAL VASCULAR BALLOON ANGIOPLASTY;  Surgeon: [name], MD;  Location: [facility];  Service: Cardiovascular;;  ? REVISON OF ARTERIOVENOUS FISTULA Left 07/19/2020  ? Procedure: REVISON/PLICATION OF ARTERIOVENOUS FISTULA LEFT;  Surgeon: [name], MD;  Location: [facility];  Service: Vascular;  Laterality: Left;  ? THROMBECTOMY AND REVISION OF ARTERIOVENTOUS (AV) GORETEX  GRAFT Left 08/13/2019  ? Procedure: THROMBECTOMY OF LEFT ARM ARTERIOVENTOUS (AV) FISTULA;  Surgeon: [name], MD;  Location: [facility];  Service: Vascular;  Laterality: Left;  ? TOE AMPUTATION    ? ?HPI:  ?Patient is 61 year old African-American male past medical history of end-stage renal disease hemodialysis chronic systolic heart failure type 2 diabetes admitted to [facility] 2 days ago with acute encephalopathy secondary to infection osteomyelitis in the right foot.  He was taken to the OR with debridement he was started on vancomycin cefepime Flagyl and overnight he started to have low blood pressure on the altered mental status minimal response to Narcan. Pt had been given pain medication. Swallow eval on 3/20 recommended regular solids and thin liquids with assistance needed due to cognitive impact on swallowing. MRI 3/21 revealed numerous punctate acute infarctions scattered throughout both cerebral hemispheres.  ?  ?Assessment / Plan / Recommendation  ?Clinical Impression ? SLP was asked to reassess swallowing as there has now  been evidence of strokes on MRI 3/21. Evaluation was limited this morning as pt is NPO except for sips of water with meds pending procedure today. However, across assessment, swallowing appears to be functional and generally consistent with initial evaluation on 3/20. He has a persistent cough  noted at baseline, but it does not appear to change in frequency or be correlated directly with swallowing. Would continue PO diet as able, although in light of findings on MRI, will try to f/u at least once after diet has been resumed and testing can be more thorough. ?SLP Visit Diagnosis: Dysphagia, unspecified (R13.10) ?   ?Aspiration Risk ? Mild aspiration risk  ?  ?Diet Recommendation Regular;Thin liquid  ? ?Liquid Administration via: Cup;Straw ?Medication Administration: Whole meds with liquid ?Supervision: Staff to assist with self feeding;Full supervision/cueing for compensatory strategies ?Compensations: Slow rate;Small sips/bites;Minimize environmental distractions ?Postural Changes: Seated upright at 90 degrees  ?  ?Other  Recommendations Oral Care Recommendations: Oral care BID   ? ?Recommendations for follow up therapy are one component of a multi-disciplinary discharge planning process, led by the attending physician.  Recommendations may be updated based on patient status, additional functional criteria and insurance authorization. ? ?Follow up Recommendations No SLP follow up  ? ? ?  ?Assistance Recommended at Discharge Intermittent Supervision/Assistance  ?Functional Status Assessment Patient has had a recent decline in their functional status and demonstrates the ability to make significant improvements in function in a reasonable and predictable amount of time.  ?Frequency and Duration min 1 x/week  ?1 week ?  ?   ? ?Prognosis Prognosis for Safe Diet Advancement: Good ?Barriers to Reach Goals: Cognitive deficits  ? ?  ? ?Swallow Study   ?General HPI: Patient is 61 year old African-American male past medical history of end-stage renal disease hemodialysis chronic systolic heart failure type 2 diabetes admitted to [facility] 2 days ago with acute encephalopathy secondary to infection osteomyelitis in the right foot.  He was taken to the OR with debridement he was started on vancomycin  cefepime Flagyl and overnight he started to have low blood pressure on the altered mental status minimal response to Narcan. Pt had been given pain medication. Swallow eval on 3/20 recommended regular solids and thin liquids with assistance needed due to cognitive impact on swallowing. MRI 3/21 revealed numerous punctate acute infarctions scattered throughout both cerebral hemispheres. ?Type of Study: Bedside Swallow Evaluation ?Previous Swallow Assessment: see HPI ?Diet Prior to this Study: NPO (pendign procedure; otherwise was still on regular solids and thin liqiuds) ?Temperature Spikes Noted: No ?Respiratory Status: Room air ?History of Recent Intubation: No ?Behavior/Cognition: Alert;Requires cueing;Confused ?Oral Cavity Assessment: Within Functional Limits ?Oral Care Completed by SLP: No ?Oral Cavity - Dentition: Adequate natural dentition ?Vision: Functional for self-feeding ?Patient Positioning: Upright in bed ?Baseline Vocal Quality: Normal ?Volitional Cough: Strong ?Volitional Swallow: Able to elicit  ?  ?Oral/Motor/Sensory Function Overall Oral Motor/Sensory Function: Within functional limits   ?Ice Chips Ice chips: Not tested   ?Thin Liquid Thin Liquid: Within functional limits (wiht pills) ?Presentation: Straw  ?  ?Nectar Thick Nectar Thick Liquid: Not tested   ?Honey Thick Honey Thick Liquid: Not tested   ?Puree Puree: Not tested   ?Solid ? ? ?  Solid: Not tested  ? ?  ? ?Osie Bond., M.A. CCC-SLP ?Acute Rehabilitation Services ?Pager [phone] ?Office [phone] ? ?02/06/2022,10:39 AM ? ? ? ? ?

## 2022-02-06 NOTE — Progress Notes (Addendum)
? ?   ?I have seen and examined the patient. I have personally reviewed the clinical findings, laboratory findings, microbiological data and imaging studies. The assessment and treatment plan was discussed with the Nurse Practitioner Janene Madeira. I agree with her/his recommendations except following additions/corrections. ? ?He is more awake, oriented to name, place and following simple commands  ? ?3/19 tissue cx now also growing Bacteroides thetaiotaomicron + Morganella morganii, E Faecalis  ?3/19 blood cx updated as anaerobic GPR ( previously GPC in diplos) + Morganella morganii ? ?Planned for Left AV site fistulogram with Vascular surgery today ? ?MRI rt foot/ankle concerning for remaining osteomyelitis of 4th and 5th metatarsal. Podiatry following with a plan for repeat OR over the weekend  ? ?Do not feel strongly getting a TEE from ID standpoint given low grade bacteremia with a GNR/GPR with rt foot being the source. Would defer need of TEE to Neurology if helpful for stroke work up as he would be treated with prolonged IV abtx either way given osteomyelitis.  ? ?Recommendations as noted below ? ?[name], MD ?Infectious Disease Physician ?[facility] ?[address] ?[place], [place] [address] ?Phone: [phone]  Fax: [phone] ? ?[place] for Infectious Disease ? ?Date of Admission:  02/02/2022    ? ? ?Total days of antibiotics 4  ? Vanc ? Cefepime  ? Metronidazole  ?        ? ?ASSESSMENT: ?[name] FELCH Sr. is a 61 y.o. male here with acute encephalopathy in the setting of infection of right foot. Taken urgently to OR on 3/19 and now POD4 s/p revision of Rt 5th ray amputation. Large blister evacuated 3/21 with murky malodorous brown fluid.  ?Podiatry's plan is to allow for improvement of encephalopathy and then proceed with repeat debridement with VAC placement - TBD.  ?Temp 99.7 today - follow.  ? ?[name] [name] is more awake and  interactive today. Speech is slow but doing well with coaching. Speech therapy team feel he is low aspiration risk and have cleared him for diet after procedure today - Fistulogram planned per VVS team to address dialysis access.  ? ?Wound cultures as expected are polymicrobial - morganella morganii, enterococcus faecalis and bacteroides species. No changes to current broad spectrum coverage with vancomycin, cefepime and metronidazole. Planning for 6 weeks of treatment for foot-salvage approach.  ? ?Morganella bacteremia 2/2 foot abscess; not a common cause of endocarditis, especially native valve. Would not need TEE from ID perspective as he is already getting prolonged course and would not change treatment decisions for his infection. If neurology feels a TEE would be valuable as part of his stroke work up and care, would defer that decision to them.  ? ? ?PLAN: ?Continue IV cefepime and vancomycin post HD  ?Continue PO metronidazole  ?Follow for podiatry surgery plan --> abx to continue 6 weeks after last debridement intervention ? ? ?Principal Problem: ?  Osteomyelitis ([place]) ?Active Problems: ?  ESRD (end stage renal disease) ([place]) ?  DM2 (diabetes mellitus, type 2) ([place]) ?  HTN (hypertension) ?  Chronic systolic CHF (congestive heart failure) ([place]) ?  Acute metabolic encephalopathy ?  Severe sepsis ([place]) ?  Acute respiratory failure with hypoxia ([place]) ?  Protein-calorie malnutrition, mild ([place]) ?  GERD (gastroesophageal reflux disease) ?  Hyponatremia ?  Hyperkalemia ?  High anion gap metabolic acidosis ?  Anemia of chronic renal failure ?  Necrotizing soft tissue infection ?  Cellulitis in  diabetic foot ([place]) ?  Bacteremia ?  Stroke [facility]) ?  Carotid stenosis ? ? ? [MAR Hold] (feeding supplement) PROSource Plus  30 mL Oral BID BM  ? [MAR Hold] vitamin C  500 mg Oral Daily  ? [MAR Hold] aspirin EC  81 mg Oral Daily  ? [MAR Hold] Chlorhexidine Gluconate Cloth  6 each Topical Q0600  ? [MAR Hold] famotidine  20 mg  Oral Daily  ? [MAR Hold] feeding supplement (NEPRO CARB STEADY)  237 mL Oral BID BM  ? [MAR Hold] heparin  5,000 Units Subcutaneous Q8H  ? [MAR Hold] insulin aspart  0-20 Units Subcutaneous Q4H  ? [MAR Hold] insulin glargine-yfgn  20 Units Subcutaneous Daily  ? [MAR Hold] metroNIDAZOLE  500 mg Oral Q12H  ? [MAR Hold] multivitamin  1 tablet Oral QHS  ? [MAR Hold] mupirocin ointment  1 application. Nasal BID  ? [MAR Hold] pantoprazole  40 mg Oral Q1200  ? [MAR Hold] sucroferric oxyhydroxide  1,000 mg Oral TID WC  ? [MAR Hold] thiamine injection  100 mg Intravenous Daily  ? Or  ? [MAR Hold] thiamine  100 mg Oral Daily  ? [MAR Hold] zinc sulfate  220 mg Oral BID  ? ? ?SUBJECTIVE: ?Doing OK - was not aware he had a stroke, can tell me his name and birthday.  ? ? ?Review of Systems: ?Review of Systems  ?Unable to perform ROS: Medical condition  ? ?Allergies  ?Allergen Reactions  ? Sulfa Antibiotics Nausea And Vomiting  ? ? ?OBJECTIVE: ?Vitals:  ? 02/06/22 0800 02/06/22 0900 02/06/22 1000 02/06/22 1046  ?BP: (!) 149/74 132/70 120/69   ?Pulse: 76 77 76   ?Resp: (!) 21 (!) 23 18   ?Temp:      ?TempSrc:      ?SpO2: 100% 100% 100% 100%  ?Weight:      ?Height:      ? ?Body mass index is 34.23 kg/m?. ? ?Physical Exam ?Constitutional:   ?   Appearance: He is well-developed.  ?   Comments: Resting quietly in bed. Pleasant.   ?HENT:  ?   Mouth/Throat:  ?   Mouth: Mucous membranes are dry.  ?   Dentition: Normal dentition. No dental abscesses.  ?Eyes:  ?   Conjunctiva/sclera: Conjunctivae normal.  ?   Pupils: Pupils are equal, round, and reactive to light.  ?Cardiovascular:  ?   Rate and Rhythm: Normal rate and regular rhythm.  ?   Heart sounds: Normal heart sounds.  ?Pulmonary:  ?   Effort: Pulmonary effort is normal.  ?   Breath sounds: Normal breath sounds.  ?Abdominal:  ?   General: There is no distension.  ?   Palpations: Abdomen is soft.  ?   Tenderness: There is no abdominal tenderness.  ?Musculoskeletal:  ?   Comments: Rt  foot wrapped in clean OR dressing  ?Lymphadenopathy:  ?   Cervical: No cervical adenopathy.  ?Skin: ?   General: Skin is warm and dry.  ?   Findings: No rash.  ?Neurological:  ?   Mental Status: He is alert and oriented to person, place, and time.  ?   Sensory: No sensory deficit.  ?   Comments: Says he is in the nuthouse, then smiles. Answers all other questions appropriately.   ? ? ?Lab Results ?Lab Results  ?Component Value Date  ? WBC 10.2 02/06/2022  ? HGB 11.3 (L) 02/06/2022  ? HCT 34.2 (L) 02/06/2022  ? MCV 92.7 02/06/2022  ?  PLT 247 02/06/2022  ?  ?Lab Results  ?Component Value Date  ? CREATININE 8.57 (H) 02/06/2022  ? BUN 34 (H) 02/06/2022  ? NA 133 (L) 02/06/2022  ? K 3.9 02/06/2022  ? CL 96 (L) 02/06/2022  ? CO2 25 02/06/2022  ?  ?Lab Results  ?Component Value Date  ? ALT 17 02/04/2022  ? AST 27 02/04/2022  ? ALKPHOS 66 02/04/2022  ? BILITOT 0.7 02/04/2022  ?  ? ?Microbiology: ?Recent Results (from the past 240 hour(s))  ?Blood Culture (routine x 2)     Status: Abnormal (Preliminary result)  ? Collection Time: 02/02/22  1:50 AM  ? Specimen: BLOOD RIGHT ARM  ?Result Value Ref Range Status  ? Specimen Description   Final  ?  BLOOD RIGHT ARM ?Performed at [facility], [address] [place], [place] [address] ?  ? Special Requests   Final  ?  BOTTLES DRAWN AEROBIC AND ANAEROBIC Blood Culture adequate volume ?Performed at [facility], [address], [place], [place] [address] ?  ? Culture  Setup Time   Final  ?  ANAEROBIC BOTTLE ONLY ?GRAM POSITIVE COCCI IN DIPLOS ?Gram Stain Report Called to,Read Back By and Verified With: RACHEL PUCKETT @ [phone] ON 02/04/22 C VARNER ?[place] ?CRITICAL RESULT CALLED TO, READ BACK BY AND VERIFIED WITH: PHARMD E.UELAND AT 1093 ON 02/04/2022 BY T.SAAD. ?  ? Culture (A)  Final  ?  MORGANELLA MORGANII ?CULTURE REINCUBATED FOR BETTER GROWTH ?Performed at [facility], [place] [address], [place], [place] [address] ?  ? Report Status PENDING  Incomplete  ? Organism ID,  Bacteria MORGANELLA MORGANII  Final  ?    Susceptibility  ? Morganella morganii - MIC*  ?  AMPICILLIN >=32 RESISTANT Resistant   ?  CEFAZOLIN >=64 RESISTANT Resistant   ?  CEFTAZIDIME <=1 SENSITIVE Sensitive   ?

## 2022-02-06 NOTE — Progress Notes (Signed)
?PROGRESS NOTE ? ? ? ?[name] [name].  YOV:[id number] DOB: [birth year] DOA: 02/02/2022 ?PCP: [facility]  ?Chief Complaint  ?Patient presents with  ? Altered Mental Status  ? ? ?Brief Narrative:  ?61 y/o male history of DM, ESRD on HD, admitted to the hospital with fever, acute encephalopathy, sepsis and right foot infection.  He has [name] [name] history of prior right fifth metatarsal amputation performed many years ago in [place].  Plain films of his foot indicate swelling and subcutaneous gas without any obvious bony erosion. [facility] presented to [facility] and subsequently transferred to [facility].  Podiatry consulted, he was taken emergently to the [facility] on 3/19.  Postoperatively he has had persistent lethargy and was taken to the ICU 3/19 evening.  He was placed on Narcan drip for presumed lethargy due to opioid use however he only got 5 mg of oxycodone.  Narcan drip was discontinued on 3/20 and he was transferred out of the ICU. ? ?See below for additional details ?   ? ? ?Assessment & Plan: ?  ?Principal Problem: ?  Osteomyelitis ([place]) ?Active Problems: ?  Severe sepsis ([place]) ?  Stroke [facility]) ?  Acute metabolic encephalopathy ?  ESRD (end stage renal disease) ([place]) ?  DM2 (diabetes mellitus, type 2) ([place]) ?  HTN (hypertension) ?  Chronic systolic CHF (congestive heart failure) ([place]) ?  Acute respiratory failure with hypoxia ([place]) ?  Protein-calorie malnutrition, mild ([place]) ?  GERD (gastroesophageal reflux disease) ?  Carotid stenosis ?  Hyponatremia ?  Hyperkalemia ?  High anion gap metabolic acidosis ?  Anemia of chronic renal failure ?  Necrotizing soft tissue infection ?  Cellulitis in diabetic foot ([place]) ?  Bacteremia ?  Right foot infection ? ? 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 showing morganella ?Blood cultures from 3/19 with morganella morganii and anaerobic gram positive rods -> pending ?MRI foot/ankle right with soft tissue ulceration of lateral midfoot just lateral to postsurgical remaining base of 5th metatarsal and base of 4th metatarsal, marrow edema within base of 5th greater than lateral base of 4th metatarsals concerning for acute osteomyelitis (see report) ?Podiatry planning for debridement and wound vac over weekend - would avoid perioperative hypotension given stroke below ?Appreciate ID assistance, continuing vanc, flagyl, and cefepime - low suspicion for endocarditis per ID, TTE without eivdence of valvular vegetations on TTE (per ID, "do not feel strongly regarding getting TEE from ID standpoint given low grade bacteremia with GNR/GPR with right foot being source, would defer need of TEE to neurology if helpful for stroke workup - will follow with neurology).  Recommending 6 weeks of abx after last debridement intervention. ? ? ?Stroke [facility]) ?MRI brain 3/22 with numerous punctate acute infarctions throughout both cerebral hemispheres, primarily affecting the deep brain ?Echo with Ef 45-50%, global hypokinesis, RVSF mildly reduced ?CTA neck 3/20 without LVO or hemodynamically significant stenosis of intracranial arteries, did have 70% stenosis for R ICA due to mixed density atherosclerosis ?CT venogram normal ?LDL 46, A1c 12.2 ?Appreciate neurology recommendations - suspect likely 2/2 hypoperfusion with intermittent hypotension.  Aspirin 81 mg daily.  No statin at this time, LDL at goal. ? ? 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 ? ?ESRD (end stage renal disease) ([place]) ?Per renal ?Being evaluated outpatient for possible transplant ?S/p L arm fistulogram with vascular today -> L central angioplasty innominate and subclavian vein, L peripheral angioplasty cephalic vein ? ?DM2 (diabetes mellitus, type 2) ([place]) ?a1c 12.2 ?Reduce basal insulin, continue SSI ?At home taking 30 units daily ? ? ?HTN (hypertension) ?Intermittent hypotension, concern that this may have resulted in watershed infarcts ?Will hold BP meds for now, not on anything at home ? ?Acute respiratory failure with hypoxia ([place]) ?Currently has been weaned to ra, continue to monitor ?CXR 3/20 without frank edema ? ?Chronic systolic CHF (congestive heart failure) ([place]) ?EF 45% per echo 05/2021 ?Echo 3/22 with EF 45-50% ?Volume per renal ? ?Carotid stenosis ?Outpatient vascular follow up ? ?GERD (gastroesophageal reflux disease) ?Continue Protonix ? ? ?DVT prophylaxis: heparin ?Code Status: full ?Family Communication: daughter 3/22 ?Disposition:  ? ?Status is: Inpatient ?Remains inpatient appropriate because: need for IV abx, continued w/u encephalopathy ?  ?Consultants:  ?Neurology ?Vascular ?ID ?Renal ?podiatry ? ?Procedures:  ?left brachiocephalic arteriovenous fistula cannulation under ultrasound guidance ?left arm fistulogram including central venogram ?left central angioplasty innominate and subclavian vein (10 mm Mustang, 12 mm Mustang, 12 mm drug coated Lutonix) ?left peripheral angioplasty cephalic vein (8 mm Mustang) ? ?Echo ?IMPRESSIONS  ? ? ? 1. Left ventricular ejection fraction, by estimation, is 45 to 50%. The  ?left ventricle has mildly decreased function. The left ventricle  ?demonstrates global hypokinesis. There is moderate left ventricular  ?hypertrophy. Left ventricular diastolic function  ? could not be evaluated.  ? 2. Right ventricular systolic  function is mildly reduced. The right  ?ventricular size is normal.  ? 3. The mitral valve is degenerative. Mild mitral valve regurgitation. No  ?evidence of mitral stenosis. Moderate to severe mitral annular  ?calcification.  ? 4. The aortic valve is tricuspid. There is moderate calcification of the  ?aortic valve. There is moderate thickening of the aortic valve. Aortic  ?valve regurgitation is not visualized. Mild aortic valve stenosis. Aortic  ?valve area, by VTI measures 2.26  ?cm?Marland Kitchen Aortic valve mean gradient measures 8.0 mmHg. Aortic valve Vmax  ?measures 1.92 m/s.  ? ?Conclusion(s)/Recommendation(s): No evidence of valvular vegetations on  ?this transthoracic echocardiogram. Consider Kristien Salatino transesophageal  ?echocardiogram to exclude infective endocarditis if clinically indicated. ? ?Burnard Bunting ?Summary:  ?Right: Resting right ankle-brachial index is within normal range. No  ?evidence of significant right lower extremity arterial disease. The right  ?toe-brachial index is normal.  ? ?Left: Resting left ankle-brachial index indicates noncompressible left  ?lower extremity arteries. The left toe-brachial index is abnormal. ? ?Antimicrobials:  ?Anti-infectives (From admission, onward)  ? ? Start     Dose/Rate Route Frequency Ordered Stop  ? 02/03/22 2200  metroNIDAZOLE (FLAGYL) tablet 500 mg       ? 500 mg Oral Every 12 hours 02/03/22 1108 02/09/22 0959  ? 02/03/22 2000  ceFEPIme (MAXIPIME) 2 g in sodium chloride 0.9 % 100 mL IVPB       ? 2 g ?200 mL/hr over 30 Minutes Intravenous Every M-W-F (2000) 02/02/22 0459    ? 02/03/22 1200  vancomycin (VANCOCIN) IVPB 1000 mg/200 mL premix       ? 1,000 mg ?  200 mL/hr over 60 Minutes Intravenous Every M-W-F (Hemodialysis) 02/02/22 0459    ? 02/03/22 1030  metroNIDAZOLE (FLAGYL) IVPB 500 mg  Status:  Discontinued       ? 500 mg ?100 mL/hr over 60 Minutes Intravenous Every 12 hours 02/03/22 0934 02/03/22 1108  ? 02/02/22 1000  metroNIDAZOLE (FLAGYL) tablet 500 mg  Status:  Discontinued        ? 500 mg Oral Every 12 hours 02/02/22 0354 02/03/22 0934  ? 02/02/22 0130  ceFEPIme (MAXIPIME) 2 g in sodium chloride 0.9 % 100 mL IVPB       ? 2 g ?200 mL/hr over 30 Minutes Intravenous  Once 02/02/22

## 2022-02-06 NOTE — Progress Notes (Signed)
Subjective: ?[name] DRUDGE Sr. is a 61 y.o. male patient seen at bedside, resting comfortably in no acute distress with neurology resident present.  Patient is s/p day #4 right I&D with revision of fifth ray amputation site, performed by Dr. Blenda Mounts. Patient does not voice any concerns and is actually more responsive and talkative during today's encounter.  No other issues noted. ? ?Daughter not present at bedside this visit. ? ?Patient Active Problem List  ? Diagnosis Date Noted  ? Stroke [facility]) 02/05/2022  ? Carotid stenosis 02/05/2022  ? Bacteremia   ? Hyponatremia 02/03/2022  ? Hyperkalemia 02/03/2022  ? High anion gap metabolic acidosis 16/08/9603  ? Anemia of chronic renal failure 02/03/2022  ? Necrotizing soft tissue infection   ? Cellulitis in diabetic foot ([place])   ? Osteomyelitis ([place]) 02/02/2022  ? Acute metabolic encephalopathy 54/07/8118  ? Severe sepsis ([place]) 02/02/2022  ? Acute respiratory failure with hypoxia ([place]) 02/02/2022  ? Protein-calorie malnutrition, mild ([place]) 02/02/2022  ? GERD (gastroesophageal reflux disease) 02/02/2022  ? Chronic systolic CHF (congestive heart failure) ([place]) 08/14/2019  ? LBBB (left bundle branch block) 08/13/2019  ? Problem with vascular access 08/13/2019  ? Dialysis AV fistula malfunction ([place]) 08/12/2019  ? ESRD (end stage renal disease) ([place]) 09/20/2018  ? Fever 09/20/2018  ? Nausea & vomiting 09/20/2018  ? DM2 (diabetes mellitus, type 2) ([place]) 09/20/2018  ? HTN (hypertension) 09/20/2018  ? ? ? ?Current Facility-Administered Medications:  ?  [MAR Hold] (feeding supplement) PROSource Plus liquid 30 mL, 30 mL, Oral, BID BM, Caren Griffins, MD, 30 mL at 02/04/22 0939 ?  [MAR Hold] acetaminophen (TYLENOL) tablet 650 mg, 650 mg, Oral, Q6H PRN, 650 mg at 02/02/22 1540 **OR** [MAR Hold] acetaminophen (TYLENOL) suppository 650 mg, 650 mg, Rectal, Q6H PRN, [name], MD ?  Doug Sou Hold] albuterol (PROVENTIL) (2.5 MG/3ML) 0.083% nebulizer solution 2.5 mg, 2.5 mg,  Nebulization, Q2H PRN, [name], MD ?  Doug Sou Hold] ascorbic acid (VITAMIN C) tablet 500 mg, 500 mg, Oral, Daily, Caren Griffins, MD, 500 mg at 02/04/22 1478 ?  [MAR Hold] aspirin EC tablet 81 mg, 81 mg, Oral, Daily, Rosalin Hawking, MD ?  Doug Sou Hold] ceFEPIme (MAXIPIME) 2 g in sodium chloride 0.9 % 100 mL IVPB, 2 g, Intravenous, Q M,W,F-2000, [name], MD, Stopped at 02/05/22 2104 ?  [MAR Hold] Chlorhexidine Gluconate Cloth 2 % PADS 6 each, 6 each, Topical, Q0600, Adelfa Koh, NP, 6 each at 02/05/22 1803 ?  [MAR Hold] docusate sodium (COLACE) capsule 100 mg, 100 mg, Oral, BID PRN, Lestine Mount, PA-C, 100 mg at 02/05/22 2134 ?  [MAR Hold] famotidine (PEPCID) tablet 20 mg, 20 mg, Oral, Daily, Caren Griffins, MD, 20 mg at 02/05/22 2134 ?  [MAR Hold] feeding supplement (NEPRO CARB STEADY) liquid 237 mL, 237 mL, Oral, BID BM, Caren Griffins, MD, 237 mL at 02/05/22 1819 ?  [MAR Hold] heparin injection 5,000 Units, 5,000 Units, Subcutaneous, Q8H, [name], MD, 5,000 Units at 02/06/22 0541 ?  [MAR Hold] insulin aspart (novoLOG) injection 0-20 Units, 0-20 Units, Subcutaneous, Q4H, Dela Newman Nickels, MD, 4 Units at 02/06/22 [phone] ?  [MAR Hold] insulin glargine-yfgn (SEMGLEE) injection 20 Units, 20 Units, Subcutaneous, Daily, Elodia Florence., MD, 20 Units at 02/05/22 1600 ?  iodixanol (VISIPAQUE) 320 MG/ML injection, , , PRN, [name], MD, 135 mL at 02/06/22 1135 ?  [MAR Hold] metroNIDAZOLE (FLAGYL) tablet 500 mg, 500 mg, Oral, Q12H, Gher[place]he, Vella Redhead, MD,  500 mg at 02/06/22 1610 ?  [MAR Hold] multivitamin (RENA-VIT) tablet 1 tablet, 1 tablet, Oral, QHS, Caren Griffins, MD, 1 tablet at 02/05/22 2134 ?  [MAR Hold] mupirocin ointment (BACTROBAN) 2 % 1 application., 1 application., Nasal, BID, Margaretha Seeds, MD, 1 application. at 02/06/22 1235 ?  [MAR Hold] ondansetron (ZOFRAN) tablet 4 mg, 4 mg, Oral, Q6H PRN **OR** [MAR Hold] ondansetron (ZOFRAN) injection 4 mg, 4  mg, Intravenous, Q6H PRN, [name], MD ?  Doug Sou Hold] pantoprazole (PROTONIX) EC tablet 40 mg, 40 mg, Oral, Q1200, Caren Griffins, MD, 40 mg at 02/06/22 0957 ?  [MAR Hold] polyethylene glycol (MIRALAX / GLYCOLAX) packet 17 g, 17 g, Oral, Daily PRN, [name], MD ?  Doug Sou Hold] sodium chloride 0.9 % bolus 250 mL, 250 mL, Intravenous, Once PRN, Shela Leff, MD ?  Doug Sou Hold] sucroferric oxyhydroxide (VELPHORO) chewable tablet 1,000 mg, 1,000 mg, Oral, TID WC, [name], MD, 1,000 mg at 02/05/22 1812 ?  [MAR Hold] thiamine (B-1) injection 100 mg, 100 mg, Intravenous, Daily, 100 mg at 02/03/22 0242 **OR** [MAR Hold] thiamine tablet 100 mg, 100 mg, Oral, Daily, Donnetta Simpers, MD, 100 mg at 02/06/22 0959 ?  [MAR Hold] vancomycin (VANCOCIN) IVPB 1000 mg/200 mL premix, 1,000 mg, Intravenous, Q M,W,F-HD, [name], MD, Stopping previously hung infusion at 02/05/22 1901 ?  [MAR Hold] zinc sulfate capsule 220 mg, 220 mg, Oral, BID, Caren Griffins, MD, 220 mg at 02/06/22 0957 ? ?Allergies  ?Allergen Reactions  ? Sulfa Antibiotics Nausea And Vomiting  ? ? ? ?Objective: ?Today's Vitals  ? 02/06/22 1134 02/06/22 1139 02/06/22 1144 02/06/22 1200  ?BP: 115/67 115/67    ?Pulse: 71 (!) 0 (!) 0   ?Resp: 18     ?Temp:    99 ?F (37.2 ?C)  ?TempSrc:    Oral  ?SpO2:      ?Weight:      ?Height:      ?PainSc:    0-No pain  ? ? ?General: No acute distress ? ?Right Lower extremity: Dressing to Right foot clean, dry, intact. No strikethrough noted, Upon removal of dressings there is a large wound at the dorsal lateral right foot that measures greater than 5 cm with exposed subcutaneous tissue tendon and bone at the proximal resection site of the revised fifth ray amputation, unchanged from prior, there is small areas of marginal necrosis and superior to the wound there is a necrotic blister that appears to be about the same however there is more increased edema and erythema at the dorsal lateral foot  extending to the ankle on the right.  Pedal pulses are present on the right.  No obvious pain or grimace with palpation to foot or calf. ? ? ? ?Assessment and Plan:  ?Problem List Items Addressed This Visit   ? ?  ? Genitourinary  ? ESRD (end stage renal disease) ([place]) - Primary  ?  Per renal ?Being evaluated outpatient for possible transplant ?Plan for fistulogram 3/23 with vascular ?  ?  ? ?Other Visit Diagnoses   ? ? Delirium      ? Right foot infection      ? Relevant Medications  ? ceFEPIme (MAXIPIME) 2 g in sodium chloride 0.9 % 100 mL IVPB (Completed)  ? metroNIDAZOLE (FLAGYL) IVPB 500 mg (Completed)  ? vancomycin (VANCOREADY) IVPB 2000 mg/400 mL (Completed)  ? vancomycin (VANCOCIN) IVPB 1000 mg/200 mL premix  ? ceFEPIme (MAXIPIME) 2 g in sodium chloride 0.9 % 100  mL IVPB  ? mupirocin ointment (BACTROBAN) 2 % 1 application.  ? metroNIDAZOLE (FLAGYL) tablet 500 mg  ? ?  ? ? ?-Patient seen and evaluated at bedside ?-Dressing change performed applied saline wet-to-dry dressing on the right nursing to continue to assist with dressing changes as ordered as well if there is any strikethrough ?-MRI results were reviewed; changes suggestive of residual osteomyelitis at the fourth fifth metatarsals.  I will discuss with his daughter surgery plan; hopefully I can take him over the weekend to do a repeat debridement and subsequently a wound VAC for additional management ?-Continue with antibiotics as directed by infectious disease ?-Continue with rest and elevation to assist with pain and edema control  ?-Podiatry will continue to follow closely. ? ?[name], DPM ?[facility] ?[phone] office ?[phone] cell ?Available via secure chat  ?

## 2022-02-06 NOTE — Progress Notes (Signed)
PHARMACY - PHYSICIAN COMMUNICATION ?CRITICAL VALUE ALERT - BLOOD CULTURE IDENTIFICATION (BCID) ? ?[name] [name] Sr. is an 61 y.o. male who presented to [facility] on 02/02/2022 with a chief complaint of bacteremia secondary to foot abscess. ? ?Assessment: Morganella morganii, GPR pending identification ? ?Name of physician (or Provider) Contacted: Janene Madeira ? ?Current antibiotics: Cefepime, Vancomycin, Flagyl ? ?Changes to prescribed antibiotics recommended:  ?No changes ? ?Results for orders placed or performed during the hospital encounter of 02/02/22  ?Blood Culture ID Panel (Reflexed) (Collected: 02/02/2022  1:50 AM)  ?Result Value Ref Range  ? Enterococcus faecalis NOT DETECTED NOT DETECTED  ? Enterococcus Faecium NOT DETECTED NOT DETECTED  ? Listeria monocytogenes NOT DETECTED NOT DETECTED  ? Staphylococcus species NOT DETECTED NOT DETECTED  ? Staphylococcus aureus (BCID) NOT DETECTED NOT DETECTED  ? Staphylococcus epidermidis NOT DETECTED NOT DETECTED  ? Staphylococcus lugdunensis NOT DETECTED NOT DETECTED  ? Streptococcus species NOT DETECTED NOT DETECTED  ? Streptococcus agalactiae NOT DETECTED NOT DETECTED  ? Streptococcus pneumoniae NOT DETECTED NOT DETECTED  ? Streptococcus pyogenes NOT DETECTED NOT DETECTED  ? A.calcoaceticus-baumannii NOT DETECTED NOT DETECTED  ? Bacteroides fragilis NOT DETECTED NOT DETECTED  ? Enterobacterales DETECTED (A) NOT DETECTED  ? Enterobacter cloacae complex NOT DETECTED NOT DETECTED  ? Escherichia coli NOT DETECTED NOT DETECTED  ? Klebsiella aerogenes NOT DETECTED NOT DETECTED  ? Klebsiella oxytoca NOT DETECTED NOT DETECTED  ? Klebsiella pneumoniae NOT DETECTED NOT DETECTED  ? Proteus species NOT DETECTED NOT DETECTED  ? Salmonella species NOT DETECTED NOT DETECTED  ? Serratia marcescens NOT DETECTED NOT DETECTED  ? Haemophilus influenzae NOT DETECTED NOT DETECTED  ? Neisseria meningitidis NOT DETECTED NOT DETECTED  ? Pseudomonas aeruginosa NOT DETECTED NOT DETECTED  ?  Stenotrophomonas maltophilia NOT DETECTED NOT DETECTED  ? Candida albicans NOT DETECTED NOT DETECTED  ? Candida auris NOT DETECTED NOT DETECTED  ? Candida glabrata NOT DETECTED NOT DETECTED  ? Candida krusei NOT DETECTED NOT DETECTED  ? Candida parapsilosis NOT DETECTED NOT DETECTED  ? Candida tropicalis NOT DETECTED NOT DETECTED  ? Cryptococcus neoformans/gattii NOT DETECTED NOT DETECTED  ? CTX-M ESBL NOT DETECTED NOT DETECTED  ? Carbapenem resistance IMP NOT DETECTED NOT DETECTED  ? Carbapenem resistance KPC NOT DETECTED NOT DETECTED  ? Carbapenem resistance NDM NOT DETECTED NOT DETECTED  ? Carbapenem resist OXA 48 LIKE NOT DETECTED NOT DETECTED  ? Carbapenem resistance VIM NOT DETECTED NOT DETECTED  ? ? ?Laurey Arrow, PharmD ?PGY1 Pharmacy Resident ?02/06/2022  11:24 AM ? ?Please check [email or web address] for unit-specific pharmacy phone numbers. ? ?

## 2022-02-06 NOTE — Procedures (Signed)
Patient Name: [name] TRIEU Sr.  ?MRN: [id number]  ?Epilepsy Attending: [name]  ?Referring Physician/Provider: Elodia Florence., MD ?Date:  02/06/2022 ?Duration: 22.44 mins ? ?Patient history: 61 year old male with altered mental status.  EEG evaluate for seizure. ? ?Level of alertness:  lethargic  ? ?AEDs during EEG study: None ? ?Technical aspects: This EEG study was done with scalp electrodes positioned according to the 10-20 International system of electrode placement. Electrical activity was acquired at a sampling rate of 500Hz  and reviewed with a high frequency filter of 70Hz  and a low frequency filter of 1Hz . EEG data were recorded continuously and digitally stored.  ? ?Description: The posterior dominant rhythm consists of 7.5 Hz activity of moderate voltage (25-35 uV) seen predominantly in posterior head regions, symmetric and reactive to eye opening and eye closing. EEG showed intermittent generalized 3 to 6 Hz theta-delta slowing. Hyperventilation and photic stimulation were not performed.    ? ?ABNORMALITY ?- Intermittent slow, generalized ? ?IMPRESSION: ?This study is suggestive of mild diffuse encephalopathy, nonspecific etiology. No seizures or epileptiform discharges were seen throughout the recording. ? ?[name]  ? ?

## 2022-02-06 NOTE — Evaluation (Signed)
Speech Language Pathology Evaluation ?Patient Details ?Name: [name] KINCHELOE Sr. ?MRN: [id number] ?DOB: 1961/01/30 ?Today's Date: 02/06/2022 ?Time: 0940-1005 ?SLP Time Calculation (min) (ACUTE ONLY): 25 min ? ?Problem List:  ?Patient Active Problem List  ? Diagnosis Date Noted  ? Stroke [facility]) 02/05/2022  ? Carotid stenosis 02/05/2022  ? Bacteremia   ? Hyponatremia 02/03/2022  ? Hyperkalemia 02/03/2022  ? High anion gap metabolic acidosis 26/33/3545  ? Anemia of chronic renal failure 02/03/2022  ? Necrotizing soft tissue infection   ? Cellulitis in diabetic foot ([place])   ? Osteomyelitis ([place]) 02/02/2022  ? Acute metabolic encephalopathy 62/56/3893  ? Severe sepsis ([place]) 02/02/2022  ? Acute respiratory failure with hypoxia ([place]) 02/02/2022  ? Protein-calorie malnutrition, mild ([place]) 02/02/2022  ? GERD (gastroesophageal reflux disease) 02/02/2022  ? Chronic systolic CHF (congestive heart failure) ([place]) 08/14/2019  ? LBBB (left bundle branch block) 08/13/2019  ? Problem with vascular access 08/13/2019  ? Dialysis AV fistula malfunction ([place]) 08/12/2019  ? ESRD (end stage renal disease) ([place]) 09/20/2018  ? Fever 09/20/2018  ? Nausea & vomiting 09/20/2018  ? DM2 (diabetes mellitus, type 2) ([place]) 09/20/2018  ? HTN (hypertension) 09/20/2018  ? ?Past Medical History:  ?Past Medical History:  ?Diagnosis Date  ? CHF (congestive heart failure) ([place])   ? Diabetes mellitus without complication ([place])   ? type 2  ? ESRD on hemodialysis ([place])   ? mon wed fri  ? Hypertension   ? Kidney failure   ? Myocardial infarction [facility])   ? Nonischemic cardiomyopathy ([place])   ? ?Past Surgical History:  ?Past Surgical History:  ?Procedure Laterality Date  ? A/V FISTULAGRAM N/A 08/15/2019  ? Procedure: A/V FISTULAGRAM;  Surgeon: [name], MD;  Location: [place] [facility];  Service: Cardiovascular;  Laterality: N/A;  ? AMPUTATION Right 02/02/2022  ? Procedure: AMPUTATION 5th RAY;  Surgeon: [name], MD;  Location: [place];  Service:  Podiatry;  Laterality: Right;  ? AV FISTULA PLACEMENT    ? CARDIAC CATHETERIZATION  02/17/2018  ? LHC 02/17/18 ([name]): 30% pLAD, 30% dRCA, LVEDP 25-30, LVEF 25%.    ? CATARACT EXTRACTION W/PHACO Right 03/29/2020  ? Procedure: CATARACT EXTRACTION PHACO AND INTRAOCULAR LENS PLACEMENT ([place])  RIGHT DIABETIC;  Surgeon: [name], MD;  Location: [facility];  Service: Ophthalmology;  Laterality: Right;  Lot # [id number] ?[place]: 02:30.9 ?AP% 9.5% ?CDE: 14.38  ? EYE SURGERY Right   ? cataract removed  ? I & D EXTREMITY Right 02/02/2022  ? Procedure: IRRIGATION AND DEBRIDEMENT RIGHT FOOT;  Surgeon: [name], MD;  Location: [place];  Service: Podiatry;  Laterality: Right;  ? INSERTION OF DIALYSIS CATHETER  08/13/2019  ? Procedure: Insertion Of Dialysis Catheter;  Surgeon: [name], MD;  Location: [facility] OR;  Service: Vascular;;  ? INSERTION OF DIALYSIS CATHETER Left 07/19/2020  ? Procedure: INSERTION OF DIALYSIS CATHETER;  Surgeon: [name], MD;  Location: [place];  Service: Vascular;  Laterality: Left;  ? IRRIGATION AND DEBRIDEMENT ABSCESS    ? PERIPHERAL VASCULAR BALLOON ANGIOPLASTY  08/15/2019  ? Procedure: PERIPHERAL VASCULAR BALLOON ANGIOPLASTY;  Surgeon: [name], MD;  Location: [place] [facility];  Service: Cardiovascular;;  ? REVISON OF ARTERIOVENOUS FISTULA Left 07/19/2020  ? Procedure: REVISON/PLICATION OF ARTERIOVENOUS FISTULA LEFT;  Surgeon: [name], MD;  Location: [place];  Service: Vascular;  Laterality: Left;  ? THROMBECTOMY AND REVISION OF ARTERIOVENTOUS (AV) GORETEX  GRAFT Left 08/13/2019  ? Procedure: THROMBECTOMY OF LEFT ARM ARTERIOVENTOUS (AV)  FISTULA;  Surgeon: [name], MD;  Location: [facility] OR;  Service: Vascular;  Laterality: Left;  ? TOE AMPUTATION    ? ?HPI:  ?Patient is 61 year old African-American male past medical history of end-stage renal disease hemodialysis chronic systolic heart failure type 2 diabetes admitted to [facility] 2 days ago with acute encephalopathy secondary to infection osteomyelitis in the right foot.  He was taken to the OR with debridement he was started on vancomycin cefepime Flagyl and overnight he started to have low blood pressure on the altered mental status minimal response to Narcan. Pt had been given pain medication. Swallow eval on 3/20 recommended regular solids and thin liquids with assistance needed due to cognitive impact on swallowing. MRI 3/21 revealed numerous punctate acute infarctions scattered throughout both cerebral hemispheres.  ? ?Assessment / Plan / Recommendation ?Clinical Impression ? Pt presents with cognitive impairments that may be multifactorial in nature. He is not fully oriented and is very distractible even after SLP tried to reduce distractions in the environment. This impacts his ability to solve simple verbal problems and basic calculations as well as his ability to store/retrieve new information. He recalled 1/4 words after a few minutes even with multiple choice options provided. Pt's reponse times to questions and commands fluctuates throughout the evaluation, at times speaking quickly and fluently and at other times, taking quite a while to respond and repeating SLP's prompt multiple times before trying to answer. Given that he reports he is typically very independent at baseline, this appears to be an acute change and warrants ongoing SLP services. ?   ?SLP Assessment ? SLP Recommendation/Assessment: Patient needs continued [place] [facility] ?SLP Visit Diagnosis: Cognitive communication deficit (R41.841)  ?  ?Recommendations for follow up therapy are one component of a multi-disciplinary discharge planning process, led by the attending physician.  Recommendations may be updated based on patient status, additional functional criteria and insurance authorization. ?   ?Follow Up Recommendations ? Skilled nursing-short term rehab (<3 hours/day)  ?   ?Assistance Recommended at Discharge ? Intermittent Supervision/Assistance  ?Functional Status Assessment Patient has had a recent decline in their functional status and demonstrates the ability to make significant improvements in function in a reasonable and predictable amount of time.  ?Frequency and Duration min 2x/week  ?2 weeks ?  ?   ?SLP Evaluation ?Cognition ? Overall Cognitive Status: Impaired/Different from baseline ?Arousal/Alertness: Awake/alert ?Orientation Level: Oriented to person;Oriented to place;Disoriented to time ?Attention: Sustained;Selective ?Sustained Attention: Impaired ?Sustained Attention Impairment: Verbal basic ?Selective Attention: Impaired ?Selective Attention Impairment: Verbal basic ?Memory: Impaired ?Memory Impairment: Storage deficit;Retrieval deficit;Decreased recall of new information ?Awareness: Impaired ?Awareness Impairment: Intellectual impairment;Emergent impairment ?Problem Solving: Impaired ?Problem Solving Impairment: Verbal basic ?Behaviors: Impulsive ?Safety/Judgment: Impaired  ?  ?   ?Comprehension ? Auditory Comprehension ?Overall Auditory Comprehension: Impaired ?Commands: Within Functional Limits (one-step) ?Conversation: Simple ?Interfering Components: Attention;Processing speed;Working memory  ?  ?Expression Expression ?Primary Mode of Expression: Verbal ?Verbal Expression ?Overall Verbal Expression: Appears within functional limits for tasks assessed   ?Oral / Motor ? Oral Motor/Sensory Function ?Overall Oral Motor/Sensory Function: Within functional limits ?Motor Speech ?Overall Motor Speech: Appears within functional limits for tasks assessed   ?        ? ?Osie Bond., M.A. CCC-SLP ?Acute Rehabilitation Services ?Pager [phone] ?Office [phone] ? ?02/06/2022, 10:47 AM ? ? ?

## 2022-02-06 NOTE — Progress Notes (Signed)
 [facility] Progress Note   Subjective: Seen in room. Talking on phone, speech still low except when asking for water-then quite clear. Oriented to person/place. HD tomorrow on schedule.    Objective Vitals:   02/06/22 1139 02/06/22 1144 02/06/22 1200 02/06/22 1236  BP: 115/67   107/80  Pulse: (!) 0 (!) 0    Resp:    19  Temp:   99 F (37.2 C)   TempSrc:   Oral   SpO2:    99%  Weight:      Height:       Physical Exam General: Chronically ill appearing male in NAD Neuro: Not speaking but follows some commands. Tracks and accomodates objects.  Heart: S1,S2 No M/R/G Lungs: CTAB, no WOB Abdomen: Obese, NT, ND Extremities:no LE edema ACE wrap R foot.  Dialysis Access: L AVF with thinning area lower portion on AVF. +T/B. Suture intact post F'gram    Additional Objective Labs: Basic Metabolic Panel: Recent Labs  Lab 02/03/22 0037 02/03/22 0408 02/04/22 0217 02/05/22 0144 02/06/22 0132  NA 131*  130* 128* 132* 132* 133*  K 5.5*  5.3* 6.1* 3.7 3.6 3.9  CL 92*  91* 90* 93* 95* 96*  CO2 23  23 18* 26 25 25   GLUCOSE 278*  282* 358* 216* 72 190*  BUN 47*  48* 47* 35* 50* 34*  CREATININE 13.10*  13.19* 13.14* 9.58* 11.23* 8.57*  CALCIUM  9.0  8.9 8.7* 8.5* 8.8* 8.6*  PHOS 3.1 3.4  --   --   --    Liver Function Tests: Recent Labs  Lab 02/02/22 0604 02/03/22 0037 02/04/22 0217  AST 29 27  27 27   ALT 18 17  19 17   ALKPHOS 71 68  69 66  BILITOT 0.8 0.8  0.9 0.7  PROT 7.5 7.8  8.0 7.1  ALBUMIN  2.9* 2.6*  2.7* 2.2*   No results for input(s): LIPASE, AMYLASE in the last 168 hours. CBC: Recent Labs  Lab 02/02/22 0107 02/02/22 0920 02/03/22 0037 02/03/22 0408 02/04/22 0217 02/05/22 0144 02/06/22 0132  WBC 12.0*   < > 13.6* 11.6* 11.0* 11.3* 10.2  NEUTROABS 9.1*  --   --   --   --   --   --   HGB 12.0*   < > 12.3* 11.6* 11.3* 11.2* 11.3*  HCT 37.1*   < > 38.8* 36.2* 33.7* 33.4* 34.2*  MCV 95.9   < > 95.3 94.3 92.3 92.3 92.7  PLT 171    < > 160 150 176 220 247   < > = values in this interval not displayed.   Blood Culture    Component Value Date/Time   SDES WOUND 02/02/2022 1237   SPECREQUEST RIGHT FOOT WOUND SPEC A 02/02/2022 1237   CULT  02/02/2022 1237    MODERATE MORGANELLA MORGANII FEW ENTEROCOCCUS FAECALIS MODERATE BACTEROIDES THETAIOTAOMICRON BETA LACTAMASE POSITIVE Performed at [facility], [address] [place], [place] [address]    REPTSTATUS 02/05/2022 FINAL 02/02/2022 1237    Cardiac Enzymes: No results for input(s): CKTOTAL, CKMB, CKMBINDEX, TROPONINI in the last 168 hours. CBG: Recent Labs  Lab 02/05/22 1949 02/06/22 0037 02/06/22 0341 02/06/22 0724 02/06/22 1210  GLUCAP 170* 199* 189* 194* 148*   Iron  Studies: No results for input(s): IRON , TIBC, TRANSFERRIN, FERRITIN in the last 72 hours. @lablastinr3 @ Studies/Results: MR BRAIN WO CONTRAST  Result Date: 02/04/2022 CLINICAL DATA:  Mental status change of unknown cause. EXAM: MRI HEAD WITHOUT CONTRAST TECHNIQUE: Multiplanar, multiecho  pulse sequences of the brain and surrounding structures were obtained without intravenous contrast. COMPARISON:  CT studies done yesterday. FINDINGS: Brain: Diffusion imaging shows numerous punctate foci of restricted diffusion primarily affecting the deep white matter but also involvement of the splenium of the corpus callosum and a few small foci affecting the cortical and subcortical brain. The pattern could be due to micro embolic disease from the heart or ascending aorta or due to hypoperfusion. No large confluent infarction. No mass lesion, hemorrhage, hydrocephalus or extra-axial collection. Vascular: Major vessels at the base of the brain show flow. 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 Findings could be due to  micro embolic disease from the heart or ascending aorta or could be due to a global hypoperfusion event. Electronically Signed   By: Oneil Officer M.D.   On: 02/04/2022 15:24   MR FOOT RIGHT WO CONTRAST  Result Date: 02/06/2022 CLINICAL DATA:  Progressive cellulitis of the right foot and ankle. Evaluate for osteomyelitis. History of type 2 diabetes. Fever, tachycardia tachypnea leukocytosis respiratory failure, and lactic acidosis. 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 EXAM: MRI OF THE RIGHT FOREFOOT WITHOUT CONTRAST; MRI OF THE RIGHT ANKLE WITHOUT CONTRAST TECHNIQUE: Multiplanar, multisequence MR imaging of the right forefoot was performed. No intravenous contrast was administered. COMPARISON:  Right foot radiographs 02/02/2022 (2 studies) and 12/06/2021; FINDINGS: MR FOREFOOT Despite efforts by the technologist and patient, motion artifact is present on today's exam and could not be eliminated. This reduces exam sensitivity and specificity. Bones/Joint/Cartilage On recent 02/02/2022 radiographs there was subcutaneous air concerning for soft tissue infection adjacent to the base of fifth metatarsal. There was prior resection of fifth metatarsal to the base of fifth metatarsal. On the current study there is apparent exposure of the base of the fifth metatarsal and the proximal lateral aspect of fourth metatarsal to the exposed wound (ankle MRI axial series 4 images 53 through 60 and axial series 15 images 35 through 38). This is concerning for acute osteomyelitis of the majority of the remaining fifth metatarsal and of the lateral base of fourth metatarsal. Mild hallux valgus. Moderate great toe metatarsophalangeal joint osteoarthritis. Moderate cartilage thinning throughout the interphalangeal joints. Redemonstration of remote healed fracture of the mid shaft of the proximal phalanx of the great toe. Moderate to severe second through fourth, and mild first  tarsometatarsal osteoarthritis. Ligaments The Lisfranc ligament complex appears intact. Muscles and Tendons Moderate atrophy and fatty infiltration of the plantar and dorsal midfoot and forefoot musculature. Moderate edema within the plantar greater than dorsal musculature, nonspecific myositis. Soft tissues Mild diffuse soft tissue swelling of the dorsal midfoot subcutaneous fat. -- MR ANKLE Despite efforts by the technologist and patient, motion artifact is present on today's exam and could not be eliminated. This reduces exam sensitivity and specificity. TENDONS Peroneal: There is a chevron configuration of the peroneus brevis tendon starting at the distal aspect of the fibula along a 2 to 2.5 cm short segment (axial series 15 images 21 through 25). The peroneus longus tendon is intact. Posteromedial: The posterior tibial, flexor digitorum longus, and flexor hallucis longus tendons are grossly intact, within the limitations of motion artifact. Anterior: The tibialis anterior, extensor digitorum longus and extensor hallucis longus tendons are intact with minimal extensor digitorum longus tenosynovitis. Achilles: Intact Plantar Fascia: Intact. LIGAMENTS Lateral: Mild attenuation of the anterior talofibular  ligament, likely remote partial-thickness tear. The calcaneofibular, anterior and posterior talofibular, and anterior and posterior tibiofibular ligaments are intact. Medial: Grossly intact deltoid and tibial spring ligaments. CARTILAGE Ankle Joint: Likely mild thinning of the anterior tibiotalar cartilage. Subtalar Joints/Sinus Tarsi: Fat is preserved within the sinus tarsi. Bones: Moderate cartilage thinning and peripheral osteophytosis of the talonavicular and navicular-cuneiform articulations and moderate to high-grade osteoarthritis of the tarsometatarsal joints. Please see above for description of lateral midfoot soft tissue wound contacting the lateral base of fifth metatarsal and lateral base of fourth  metatarsal with clinical concern for osteomyelitis in this region. Other: Moderate medial and mild lateral subcutaneous fat edema and swelling. IMPRESSION:: IMPRESSION: 1. There is soft tissue ulceration of the lateral midfoot just lateral to the postsurgical remaining base of fifth metatarsal and base of fourth metatarsal. There is marrow edema within base of fifth greater than lateral base of fourth metatarsals concerning for acute osteomyelitis. 2. Mild short-segment partial-thickness longitudinal tear of the peroneus brevis tendon starting at the distal aspect of the fibula. 3. Partial-thickness tear of the anterior talofibular ligament. 4. Moderate great toe metatarsophalangeal joint and moderate tarsometatarsal osteoarthritis. Electronically Signed   By: Tanda Lyons M.D.   On: 02/06/2022 08:43   MR ANKLE RIGHT WO CONTRAST  Result Date: 02/06/2022 CLINICAL DATA:  Progressive cellulitis of the right foot and ankle. Evaluate for osteomyelitis. History of type 2 diabetes. Fever, tachycardia tachypnea leukocytosis respiratory failure, and lactic acidosis. 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 EXAM: MRI OF THE RIGHT FOREFOOT WITHOUT CONTRAST; MRI OF THE RIGHT ANKLE WITHOUT CONTRAST TECHNIQUE: Multiplanar, multisequence MR imaging of the right forefoot was performed. No intravenous contrast was administered. COMPARISON:  Right foot radiographs 02/02/2022 (2 studies) and 12/06/2021; FINDINGS: MR FOREFOOT Despite efforts by the technologist and patient, motion artifact is present on today's exam and could not be eliminated. This reduces exam sensitivity and specificity. Bones/Joint/Cartilage On recent 02/02/2022 radiographs there was subcutaneous air concerning for soft tissue infection adjacent to the base of fifth metatarsal. There was prior resection of fifth metatarsal to the base of fifth metatarsal. On the current study there is apparent exposure of the base  of the fifth metatarsal and the proximal lateral aspect of fourth metatarsal to the exposed wound (ankle MRI axial series 4 images 53 through 60 and axial series 15 images 35 through 38). This is concerning for acute osteomyelitis of the majority of the remaining fifth metatarsal and of the lateral base of fourth metatarsal. Mild hallux valgus. Moderate great toe metatarsophalangeal joint osteoarthritis. Moderate cartilage thinning throughout the interphalangeal joints. Redemonstration of remote healed fracture of the mid shaft of the proximal phalanx of the great toe. Moderate to severe second through fourth, and mild first tarsometatarsal osteoarthritis. Ligaments The Lisfranc ligament complex appears intact. Muscles and Tendons Moderate atrophy and fatty infiltration of the plantar and dorsal midfoot and forefoot musculature. Moderate edema within the plantar greater than dorsal musculature, nonspecific myositis. Soft tissues Mild diffuse soft tissue swelling of the dorsal midfoot subcutaneous fat. -- MR ANKLE Despite efforts by the technologist and patient, motion artifact is present on today's exam and could not be eliminated. This reduces exam sensitivity and specificity. TENDONS Peroneal: There is a chevron configuration of the peroneus brevis tendon starting at the distal aspect of the fibula along a 2 to 2.5 cm short segment (axial series 15 images 21 through 25). The peroneus longus tendon is intact. Posteromedial: The posterior tibial, flexor digitorum longus, and  flexor hallucis longus tendons are grossly intact, within the limitations of motion artifact. Anterior: The tibialis anterior, extensor digitorum longus and extensor hallucis longus tendons are intact with minimal extensor digitorum longus tenosynovitis. Achilles: Intact Plantar Fascia: Intact. LIGAMENTS Lateral: Mild attenuation of the anterior talofibular ligament, likely remote partial-thickness tear. The calcaneofibular, anterior and  posterior talofibular, and anterior and posterior tibiofibular ligaments are intact. Medial: Grossly intact deltoid and tibial spring ligaments. CARTILAGE Ankle Joint: Likely mild thinning of the anterior tibiotalar cartilage. Subtalar Joints/Sinus Tarsi: Fat is preserved within the sinus tarsi. Bones: Moderate cartilage thinning and peripheral osteophytosis of the talonavicular and navicular-cuneiform articulations and moderate to high-grade osteoarthritis of the tarsometatarsal joints. Please see above for description of lateral midfoot soft tissue wound contacting the lateral base of fifth metatarsal and lateral base of fourth metatarsal with clinical concern for osteomyelitis in this region. Other: Moderate medial and mild lateral subcutaneous fat edema and swelling. IMPRESSION:: IMPRESSION: 1. There is soft tissue ulceration of the lateral midfoot just lateral to the postsurgical remaining base of fifth metatarsal and base of fourth metatarsal. There is marrow edema within base of fifth greater than lateral base of fourth metatarsals concerning for acute osteomyelitis. 2. Mild short-segment partial-thickness longitudinal tear of the peroneus brevis tendon starting at the distal aspect of the fibula. 3. Partial-thickness tear of the anterior talofibular ligament. 4. Moderate great toe metatarsophalangeal joint and moderate tarsometatarsal osteoarthritis. Electronically Signed   By: Tanda Lyons M.D.   On: 02/06/2022 08:43   PERIPHERAL VASCULAR CATHETERIZATION  Result Date: 02/06/2022 OPERATIVE NOTE   PROCEDURE: left brachiocephalic arteriovenous fistula cannulation under ultrasound guidance left arm fistulogram including central venogram left central angioplasty innominate and subclavian vein (10 mm Mustang, 12 mm Mustang, 12 mm drug coated Lutonix) left peripheral angioplasty cephalic vein (8 mm Mustang)  PRE-OPERATIVE DIAGNOSIS: Malfunctioning left arteriovenous fistula  POST-OPERATIVE DIAGNOSIS: same as  above  SURGEON: Lonni DOROTHA Gaskins, MD  ANESTHESIA: local  ESTIMATED BLOOD LOSS: 5 cc  FINDING(S): There was evidence of 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  I also angioplastied approximate 50% stenosis in the mid upper arm distal to the stent in the cephalic vein  with excellent results.  Fistula is widely patent with excellent thrill including the stent in the mid upper arm.  SPECIMEN(S):  None  CONTRAST: 70 mL  INDICATIONS: BENJAMIM HARNISH Sr. is a 61 y.o. male who  presents with malfunctioning left arm arteriovenous fistula.  The patient is scheduled for left arm fistulogram.  The patient is aware the risks include but are not limited to: bleeding, infection, thrombosis of the cannulated access, and possible anaphylactic reaction to the contrast.  The patient is aware of the risks of the procedure and elects to proceed forward.  DESCRIPTION: After full informed written consent was obtained, the patient was brought back to the angiography suite and placed supine upon the angiography table.  The patient was connected to monitoring equipment.  The left arm was prepped and draped in the standard fashion for a left arm fistulogram.  Under ultrasound guidance, the left arteriovenous fistula was evaluated, it was patent, an image was saved.  It was cannulated with a micropuncture needle.  The microwire was advanced into the fistula and the  needle was exchanged for the a microsheath, which was lodged 2 cm into the access.  The wire was removed and the sheath was connected to the IV extension tubing.  Hand injections were completed to image the access from the antecubitum up to the level of axilla.  The central venous structures were also imaged by hand injections.  After evaluating images  elected for intervention.  A Bentson wire was placed then a short 7 [place] sheath.  Patient was given 3000 units of IV heparin .  The central venous high-grade stenosis in the proximal subclavian vein was then crossed with a BER 2 catheter and a Bentson wire and then exchanged for a long Rosen wire for more support.  The proximal subclavian vein stenosis and adjacent innominate vein were then angioplastied with a 10 mm Mustang and 12 mm Mustang to nominal pressure for 2 minutes and then a 12 mm drug-coated Lutonix for 3 minutes with good results.  A 50% stenosis in the distal upper arm cephalic vein adjacent to the stent was also treated with angioplasty with 8 mm Mustang to nominal pressure for 2 minutes with excellent results.  A 4-0 Monocryl purse-string suture was sewn around the sheath.  The sheath was removed while tying down the suture.  A sterile bandage was applied to the puncture site.  COMPLICATIONS: None  CONDITION: Stable  Lonni DOROTHA Gaskins, MD [facility] Office: [phone]   EEG adult  Result Date: 02/06/2022 Shelton Arlin KIDD, MD     02/06/2022 10:41 AM Patient Name: [name] KLOPF Sr. MRN: [id number] Epilepsy Attending: Arlin KIDD Shelton Referring Physician/Provider: Perri DELENA Meliton Mickey., MD Date:  02/06/2022 Duration: 22.44 mins Patient history: 61 year old male with altered mental status.  EEG evaluate for seizure. Level of alertness:  lethargic AEDs during EEG study: None Technical aspects: This EEG study was done with scalp electrodes positioned according to the 10-20 International system of electrode placement. Electrical activity was acquired at a sampling rate of 500Hz  and reviewed with a high frequency filter of 70Hz  and a low frequency filter of 1Hz . EEG data were recorded continuously and digitally stored. Description: The posterior dominant rhythm consists of 7.5 Hz activity of moderate voltage (25-35 uV) seen predominantly in posterior head regions,  symmetric and reactive to eye opening and eye closing. EEG showed intermittent generalized 3 to 6 Hz theta-delta slowing. Hyperventilation and photic stimulation were not performed.   ABNORMALITY - Intermittent slow, generalized IMPRESSION: This study is suggestive of mild diffuse encephalopathy, nonspecific etiology. No seizures or epileptiform discharges were seen throughout the recording. Arlin KIDD Shelton   ECHOCARDIOGRAM COMPLETE  Result Date: 02/05/2022    ECHOCARDIOGRAM REPORT   Patient Name:   REICHEN HUTZLER Sr. Date of Exam: 02/05/2022 Medical Rec #:  [id number]         Height:       73.0 in Accession #:    [id number]        Weight:       268.7 lb Date of Birth:  01-Oct-1961        BSA:          2.439 m Patient Age:    60 years          BP:           155/89 mmHg Patient Gender: M                 HR:  86 bpm. Exam Location:  Inpatient Procedure: 2D Echo, Cardiac Doppler and Color Doppler Indications:    I38 ENDOCARDITIS  History:        Patient has no prior history of Echocardiogram examinations.                 Cardiomyopathy and CHF, Previous Myocardial Infarction; Risk                 Factors:Diabetes and Hypertension.  Sonographer:    Asencion Jungling Referring Phys: [id number] [facility]  Sonographer Comments: No subcostal window. Image acquisition challenging due to patient body habitus. IMPRESSIONS  1. Left ventricular ejection fraction, by estimation, is 45 to 50%. The left ventricle has mildly decreased function. The left ventricle demonstrates global hypokinesis. There is moderate left ventricular hypertrophy. Left ventricular diastolic function  could not be evaluated.  2. Right ventricular systolic function is mildly reduced. The right ventricular size is normal.  3. The mitral valve is degenerative. Mild mitral valve regurgitation. No evidence of mitral stenosis. Moderate to severe mitral annular calcification.  4. The aortic valve is tricuspid. There is moderate calcification of the  aortic valve. There is moderate thickening of the aortic valve. Aortic valve regurgitation is not visualized. Mild aortic valve stenosis. Aortic valve area, by VTI measures 2.26 cm. Aortic valve mean gradient measures 8.0 mmHg. Aortic valve Vmax measures 1.92 m/s. Conclusion(s)/Recommendation(s): No evidence of valvular vegetations on this transthoracic echocardiogram. Consider a transesophageal echocardiogram to exclude infective endocarditis if clinically indicated. FINDINGS  Left Ventricle: Left ventricular ejection fraction, by estimation, is 45 to 50%. The left ventricle has mildly decreased function. The left ventricle demonstrates global hypokinesis. The left ventricular internal cavity size was normal in size. There is  moderate left ventricular hypertrophy. Left ventricular diastolic function could not be evaluated due to mitral annular calcification (moderate or greater). Left ventricular diastolic function could not be evaluated. Right Ventricle: The right ventricular size is normal. No increase in right ventricular wall thickness. Right ventricular systolic function is mildly reduced. Left Atrium: Left atrial size was normal in size. Right Atrium: Right atrial size was normal in size. Pericardium: There is no evidence of pericardial effusion. Presence of epicardial fat layer. Mitral Valve: The mitral valve is degenerative in appearance. Moderate to severe mitral annular calcification. Mild mitral valve regurgitation. No evidence of mitral valve stenosis. Tricuspid Valve: The tricuspid valve is grossly normal. Tricuspid valve regurgitation is mild . No evidence of tricuspid stenosis. Aortic Valve: The aortic valve is tricuspid. There is moderate calcification of the aortic valve. There is moderate thickening of the aortic valve. Aortic valve regurgitation is not visualized. Mild aortic stenosis is present. Aortic valve mean gradient measures 8.0 mmHg. Aortic valve peak gradient measures 14.7 mmHg. Aortic  valve area, by VTI measures 2.26 cm. Pulmonic Valve: The pulmonic valve was grossly normal. Pulmonic valve regurgitation is not visualized. No evidence of pulmonic stenosis. Aorta: The aortic root and ascending aorta are structurally normal, with no evidence of dilitation. Venous: The inferior vena cava was not well visualized. IAS/Shunts: The atrial septum is grossly normal.  LEFT VENTRICLE PLAX 2D LVIDd:         5.20 cm      Diastology LVIDs:         3.40 cm      LV e' medial:    5.33 cm/s LV PW:         1.40 cm      LV E/e' medial:  23.8 LV  IVS:        1.43 cm      LV e' lateral:   6.74 cm/s LVOT diam:     2.50 cm      LV E/e' lateral: 18.8 LV SV:         73 LV SV Index:   30 LVOT Area:     4.91 cm  LV Volumes (MOD) LV vol d, MOD A2C: 137.0 ml LV vol d, MOD A4C: 206.0 ml LV vol s, MOD A2C: 62.2 ml LV vol s, MOD A4C: 108.0 ml LV SV MOD A2C:     74.8 ml LV SV MOD A4C:     206.0 ml LV SV MOD BP:      85.5 ml RIGHT VENTRICLE RV S prime:     12.70 cm/s TAPSE (M-mode): 1.9 cm LEFT ATRIUM             Index        RIGHT ATRIUM           Index LA diam:        4.70 cm 1.93 cm/m   RA Area:     12.30 cm LA Vol (A2C):   46.6 ml 19.10 ml/m  RA Volume:   28.40 ml  11.64 ml/m LA Vol (A4C):   53.2 ml 21.81 ml/m LA Biplane Vol: 50.6 ml 20.74 ml/m  AORTIC VALVE                     PULMONIC VALVE AV Area (Vmax):    2.01 cm      PV Vmax:       0.49 m/s AV Area (Vmean):   2.02 cm      PV Vmean:      37.000 cm/s AV Area (VTI):     2.26 cm      PV VTI:        0.092 m AV Vmax:           192.00 cm/s   PV Peak grad:  1.0 mmHg AV Vmean:          133.000 cm/s  PV Mean grad:  1.0 mmHg AV VTI:            0.323 m AV Peak Grad:      14.7 mmHg AV Mean Grad:      8.0 mmHg LVOT Vmax:         78.80 cm/s LVOT Vmean:        54.700 cm/s LVOT VTI:          0.149 m LVOT/AV VTI ratio: 0.46  AORTA Ao Root diam: 3.20 cm Ao Asc diam:  3.60 cm MITRAL VALVE                TRICUSPID VALVE MV Area (PHT): 4.68 cm     TR Peak grad:   29.2 mmHg MV Decel  Time: 162 msec     TR Mean grad:   24.0 mmHg MR Peak grad: 53.0 mmHg     TR Vmax:        270.00 cm/s MR Mean grad: 34.0 mmHg     TR Vmean:       228.0 cm/s MR Vmax:      364.00 cm/s MR Vmean:     272.0 cm/s    SHUNTS MV E velocity: 127.00 cm/s  Systemic VTI:  0.15 m MV A velocity: 100.70 cm/s  Systemic Diam: 2.50 cm MV E/A ratio:  1.26 Darryle Decent MD  Electronically signed by Darryle Decent MD Signature Date/Time: 02/05/2022/5:09:35 PM    Final    Medications:  [MAR Hold] ceFEPime  (MAXIPIME ) IV Stopped (02/05/22 2104)   [MAR Hold] sodium chloride      [MAR Hold] vancomycin  Stopped (02/05/22 1901)    [MAR Hold] (feeding supplement) PROSource Plus  30 mL Oral BID BM   [MAR Hold] vitamin C  500 mg Oral Daily   [MAR Hold] aspirin  EC  81 mg Oral Daily   [MAR Hold] Chlorhexidine  Gluconate Cloth  6 each Topical Q0600   [MAR Hold] famotidine   20 mg Oral Daily   [MAR Hold] feeding supplement (NEPRO CARB STEADY)  237 mL Oral BID BM   [MAR Hold] heparin   5,000 Units Subcutaneous Q8H   [MAR Hold] insulin  aspart  0-20 Units Subcutaneous Q4H   [MAR Hold] insulin  glargine-yfgn  20 Units Subcutaneous Daily   [MAR Hold] metroNIDAZOLE   500 mg Oral Q12H   [MAR Hold] multivitamin  1 tablet Oral QHS   [MAR Hold] mupirocin  ointment  1 application. Nasal BID   [MAR Hold] pantoprazole   40 mg Oral Q1200   [MAR Hold] sucroferric oxyhydroxide  1,000 mg Oral TID WC   [MAR Hold] thiamine  injection  100 mg Intravenous Daily   Or   [MAR Hold] thiamine   100 mg Oral Daily   [MAR Hold] zinc  sulfate  220 mg Oral BID     Dialysis Orders:  MWF [facility] 4h  450/500    117.8kg   2/ 2.5Ca bath  Hep 6000  L AVF  - on 3/19 > hep B Ag neg, and Hep B Ab's high/ protective  - calcitriol 1.25mcg PO qHD-last 01/31/22   Assessment/Plan: Osteomyelitis-Podiatry following, on IV vanc/cefepime /Flagyl ; ID following. Morganella bacteremia 2/2 foot abscess. 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 Will need 6  weeks ABX for attempted limb salvage.  Acute metabolic encephalopathy: Improving. Per primary.  CVA: Neurology consulted. MRI done 02/04/2022 Numerous punctate acute infarctions throughout cerebral hemispheres. Per primary.  Thinning, ulcerated area lower portion of AVF: Stick away from area. VVS consulted. Went for F'gram/intervention today per Dr. Gretta. Remove suture tomorrow in HD.  ESRD - on HD MWF. HD 02/07/2022. Use 4.0 K bath.  Hypertension/volume  - Blood pressures acceptable; euvolemic on exam. UF as tolerated. Avoid hypotension during HD. Keep SBP >120.  Anemia of CKD - Hgb 11.2; No ESA/Fe needs at this time Secondary Hyperparathyroidism -  CCa okay. Continue VDRA with HD. Phos in range lower part.  Nutrition - Advance to renal diet with fluid restriction when more awake  Jhaniya Briski H. Matvey Llanas NP-C 02/06/2022, 1:08 PM  [employer] [phone]

## 2022-02-06 NOTE — Op Note (Signed)
? ? ?  OPERATIVE NOTE ? ? ?PROCEDURE: ?left brachiocephalic arteriovenous fistula cannulation under ultrasound guidance ?left arm fistulogram including central venogram ?left central angioplasty innominate and subclavian vein (10 mm Mustang, 12 mm Mustang, 12 mm drug coated Lutonix) ?left peripheral angioplasty cephalic vein (8 mm Mustang) ? ?PRE-OPERATIVE DIAGNOSIS: Malfunctioning left arteriovenous fistula ? ?POST-OPERATIVE DIAGNOSIS: same as above  ? ?SURGEON: [name], MD ? ?ANESTHESIA: local ? ?ESTIMATED BLOOD LOSS: 5 cc ? 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  I also angioplastied approximate 50% stenosis in the mid upper arm distal to the stent in the cephalic vein  with excellent results.  Fistula is widely patent with excellent thrill including the stent in the mid upper arm. ? ?SPECIMEN(S):  None ? ?CONTRAST: 70 mL ? ?INDICATIONS: ?[name] AYOTTE Sr. is a 61 y.o. male who  presents with malfunctioning left arm arteriovenous fistula.  The patient is scheduled for left arm fistulogram.  The patient is aware the risks include but are not limited to: bleeding, infection, thrombosis of the cannulated access, and possible anaphylactic reaction to the contrast.  The patient is aware of the risks of the procedure and elects to proceed forward. ? ?DESCRIPTION: ?After full informed written consent was obtained, the patient was brought back to the angiography suite and placed supine upon the angiography table.  The patient was connected to monitoring equipment.  The left arm was prepped and draped in the standard fashion for a left arm fistulogram.  Under ultrasound guidance, the left arteriovenous fistula was evaluated, it was patent, an image  was saved.  It was cannulated with a micropuncture needle.  The microwire was advanced into the fistula and the needle was exchanged for the a microsheath, which was lodged 2 cm into the access.  The wire was removed and the sheath was connected to the IV extension tubing.  Hand injections were completed to image the access from the antecubitum up to the level of axilla.  The central venous structures were also imaged by hand injections.  After evaluating images elected for intervention.  A Bentson wire was placed then a short 7 [place] sheath.  Patient was given 3000 units of IV heparin.  The central venous high-grade stenosis in the proximal subclavian vein was then crossed with a BER 2 catheter and a Bentson wire and then exchanged for a long Rosen wire for more support.  The proximal subclavian vein stenosis and adjacent innominate vein were then angioplastied with a 10 mm Mustang and 12 mm Mustang to nominal pressure for 2 minutes and then a 12 mm drug-coated Lutonix for 3 minutes with good results.  A 50% stenosis in the distal upper arm cephalic vein adjacent to the stent was also treated with angioplasty with 8 mm Mustang to nominal pressure for 2 minutes with excellent results.  A 4-0 Monocryl purse-string suture was sewn around the sheath.  The sheath was removed while tying down the suture.  A sterile bandage was applied to the puncture site. ? ?COMPLICATIONS: None ? ?CONDITION: Stable ? ?[name], MD ?[facility] ?Office: [phone] ? ?[name] ? ? ?02/06/2022 11:46 AM ? ?

## 2022-02-06 NOTE — Plan of Care (Signed)
?  Problem: Education: ?Goal: Knowledge of General Education information will improve ?Description: Including pain rating scale, medication(s)/side effects and non-pharmacologic comfort measures ?Outcome: Progressing ?  ?Problem: Health Behavior/Discharge Planning: ?Goal: Ability to manage health-related needs will improve ?Outcome: Progressing ?  ?Problem: Clinical Measurements: ?Goal: Ability to maintain clinical measurements within normal limits will improve ?Outcome: Progressing ?Goal: Will remain free from infection ?Outcome: Progressing ?Goal: Diagnostic test results will improve ?Outcome: Progressing ?Goal: Respiratory complications will improve ?Outcome: Progressing ?Goal: Cardiovascular complication will be avoided ?Outcome: Progressing ?  ?Problem: Activity: ?Goal: Risk for activity intolerance will decrease ?Outcome: Progressing ?  ?Problem: Nutrition: ?Goal: Adequate nutrition will be maintained ?Outcome: Progressing ?  ?Problem: Coping: ?Goal: Level of anxiety will decrease ?Outcome: Progressing ?  ?Problem: Elimination: ?Goal: Will not experience complications related to bowel motility ?Outcome: Progressing ?Goal: Will not experience complications related to urinary retention ?Outcome: Progressing ?  ?Problem: Pain Managment: ?Goal: General experience of comfort will improve ?Outcome: Progressing ?  ?Problem: Safety: ?Goal: Ability to remain free from injury will improve ?Outcome: Progressing ?  ?Problem: Skin Integrity: ?Goal: Risk for impaired skin integrity will decrease ?Outcome: Progressing ?  ?Problem: Education: ?Goal: Knowledge of disease or condition will improve ?Outcome: Progressing ?Goal: Knowledge of secondary prevention will improve (SELECT ALL) ?Outcome: Progressing ?Goal: Knowledge of patient specific risk factors will improve (INDIVIDUALIZE FOR PATIENT) ?Outcome: Progressing ?  ?Problem: Coping: ?Goal: Will verbalize positive feelings about self ?Outcome: Progressing ?Goal: Will  identify appropriate support needs ?Outcome: Progressing ?  ?Problem: Health Behavior/Discharge Planning: ?Goal: Ability to manage health-related needs will improve ?Outcome: Progressing ?  ?Problem: Self-Care: ?Goal: Ability to participate in self-care as condition permits will improve ?Outcome: Progressing ?Goal: Verbalization of feelings and concerns over difficulty with self-care will improve ?Outcome: Progressing ?Goal: Ability to communicate needs accurately will improve ?Outcome: Progressing ?  ?Problem: Nutrition: ?Goal: Risk of aspiration will decrease ?Outcome: Progressing ?Goal: Dietary intake will improve ?Outcome: Progressing ?  ?Problem: Ischemic Stroke/TIA Tissue Perfusion: ?Goal: Complications of ischemic stroke/TIA will be minimized ?Outcome: Progressing ?  ?Problem: Education: ?Goal: Knowledge of disease or condition will improve ?Outcome: Progressing ?Goal: Knowledge of secondary prevention will improve (SELECT ALL) ?Outcome: Progressing ?Goal: Individualized Educational Video(s) ?Outcome: Progressing ?  ?Problem: Coping: ?Goal: Will verbalize positive feelings about self ?Outcome: Progressing ?Goal: Will identify appropriate support needs ?Outcome: Progressing ?  ?Problem: Health Behavior/Discharge Planning: ?Goal: Ability to manage health-related needs will improve ?Outcome: Progressing ?  ?Problem: Self-Care: ?Goal: Ability to participate in self-care as condition permits will improve ?Outcome: Progressing ?Goal: Verbalization of feelings and concerns over difficulty with self-care will improve ?Outcome: Progressing ?Goal: Ability to communicate needs accurately will improve ?Outcome: Progressing ?  ?Problem: Nutrition: ?Goal: Risk of aspiration will decrease ?Outcome: Progressing ?Goal: Dietary intake will improve ?Outcome: Progressing ?  ?

## 2022-02-07 DIAGNOSIS — M869 Osteomyelitis, unspecified: Secondary | ICD-10-CM | POA: Diagnosis not present

## 2022-02-07 LAB — CBC WITH DIFFERENTIAL/PLATELET
Abs Immature Granulocytes: 0.1 10*3/uL — ABNORMAL HIGH (ref 0.00–0.07)
Basophils Absolute: 0 10*3/uL (ref 0.0–0.1)
Basophils Relative: 0 %
Eosinophils Absolute: 0.5 10*3/uL (ref 0.0–0.5)
Eosinophils Relative: 5 %
HCT: 35.2 % — ABNORMAL LOW (ref 39.0–52.0)
Hemoglobin: 11.2 g/dL — ABNORMAL LOW (ref 13.0–17.0)
Lymphocytes Relative: 6 %
Lymphs Abs: 0.6 10*3/uL — ABNORMAL LOW (ref 0.7–4.0)
MCH: 29.9 pg (ref 26.0–34.0)
MCHC: 31.8 g/dL (ref 30.0–36.0)
MCV: 94.1 fL (ref 80.0–100.0)
Monocytes Absolute: 0.8 10*3/uL (ref 0.1–1.0)
Monocytes Relative: 8 %
Myelocytes: 1 %
Neutro Abs: 8.2 10*3/uL — ABNORMAL HIGH (ref 1.7–7.7)
Neutrophils Relative %: 80 %
Platelets: 309 10*3/uL (ref 150–400)
RBC: 3.74 MIL/uL — ABNORMAL LOW (ref 4.22–5.81)
RDW: 17 % — ABNORMAL HIGH (ref 11.5–15.5)
WBC: 10.2 10*3/uL (ref 4.0–10.5)
nRBC: 1 % — ABNORMAL HIGH (ref 0.0–0.2)
nRBC: 1 /100 WBC — ABNORMAL HIGH

## 2022-02-07 LAB — CULTURE, BLOOD (ROUTINE X 2)
Culture: NO GROWTH
Special Requests: ADEQUATE
Special Requests: ADEQUATE

## 2022-02-07 LAB — GLUCOSE, CAPILLARY
Glucose-Capillary: 140 mg/dL — ABNORMAL HIGH (ref 70–99)
Glucose-Capillary: 145 mg/dL — ABNORMAL HIGH (ref 70–99)
Glucose-Capillary: 109 mg/dL — ABNORMAL HIGH (ref 70–99)
Glucose-Capillary: 133 mg/dL — ABNORMAL HIGH (ref 70–99)
Glucose-Capillary: 160 mg/dL — ABNORMAL HIGH (ref 70–99)

## 2022-02-07 LAB — MAGNESIUM: Magnesium: 2.2 mg/dL (ref 1.7–2.4)

## 2022-02-07 LAB — COMPREHENSIVE METABOLIC PANEL
ALT: 24 U/L (ref 0–44)
AST: 33 U/L (ref 15–41)
Albumin: 2.3 g/dL — ABNORMAL LOW (ref 3.5–5.0)
Alkaline Phosphatase: 99 U/L (ref 38–126)
Anion gap: 13 (ref 5–15)
BUN: 50 mg/dL — ABNORMAL HIGH (ref 6–20)
CO2: 24 mmol/L (ref 22–32)
Calcium: 9 mg/dL (ref 8.9–10.3)
Chloride: 96 mmol/L — ABNORMAL LOW (ref 98–111)
Creatinine, Ser: 11.45 mg/dL — ABNORMAL HIGH (ref 0.61–1.24)
GFR, Estimated: 5 mL/min — ABNORMAL LOW (ref >60)
Glucose, Bld: 132 mg/dL — ABNORMAL HIGH (ref 70–99)
Potassium: 4.1 mmol/L (ref 3.5–5.1)
Sodium: 133 mmol/L — ABNORMAL LOW (ref 135–145)
Total Bilirubin: 0.8 mg/dL (ref 0.3–1.2)
Total Protein: 7.2 g/dL (ref 6.5–8.1)

## 2022-02-07 LAB — VANCOMYCIN, RANDOM: Vancomycin Rm: 27

## 2022-02-07 LAB — PHOSPHORUS: Phosphorus: 2.7 mg/dL (ref 2.5–4.6)

## 2022-02-07 LAB — PATHOLOGIST SMEAR REVIEW

## 2022-02-07 MED ORDER — LIDOCAINE-PRILOCAINE 2.5-2.5 % EX CREA: 1.0000 "application " | TOPICAL_CREAM | CUTANEOUS | Status: DC | PRN

## 2022-02-07 MED ORDER — LIDOCAINE HCL (PF) 1 % IJ SOLN: 5.0000 mL | INTRAMUSCULAR | Status: DC | PRN

## 2022-02-07 MED ORDER — SODIUM CHLORIDE 0.9 % IV SOLN: 100.0000 mL | INTRAVENOUS | Status: DC | PRN

## 2022-02-07 MED ORDER — PENTAFLUOROPROP-TETRAFLUOROETH EX AERO: 1.0000 "application " | INHALATION_SPRAY | CUTANEOUS | Status: DC | PRN

## 2022-02-07 MED FILL — Heparin Sod (Porcine)-NaCl IV Soln 1000 Unit/500ML-0.9%: INTRAVENOUS | Qty: 500 | Status: AC

## 2022-02-07 NOTE — Progress Notes (Signed)
PT Cancellation Note ? ?Patient Details ?Name: [name] NAKAMURA Sr. ?MRN: [id number] ?DOB: Sep 09, 1961 ? ? ?Cancelled Treatment:    Reason Eval/Treat Not Completed: (P) Patient at procedure or test/unavailable Pt is off floor for HD. PT will follow back for treatment on Monday. ? ?Almena Hokenson B. [name] PT, DPT ?Acute Rehabilitation Services ?Pager [phone] ?Office [phone] ? ?[place] ?02/07/2022, 1:23 PM ? ? ?

## 2022-02-07 NOTE — Progress Notes (Signed)
?PROGRESS NOTE ? ? ? ?[name] BRADWAY Sr.  ELF:[id number] DOB: Jun 22, 1961 DOA: 02/02/2022 ?PCP: [facility]  ?Chief Complaint  ?Patient presents with  ? Altered Mental Status  ? ? ?Brief Narrative:  ?61 y/o male history of DM, ESRD on HD, admitted to the hospital with fever, acute encephalopathy, sepsis and right foot infection.  He has Tristina Sahagian history of prior right fifth metatarsal amputation performed many years ago in [place].  Plain films of his foot indicate swelling and subcutaneous gas without any obvious bony erosion. He initially presented to [facility] and subsequently transferred to [facility].  Podiatry consulted, he was taken emergently to the [facility] on 3/19.  Postoperatively he has had persistent lethargy and was taken to the ICU 3/19 evening.  He was placed on Narcan drip for presumed lethargy due to opioid use however he only got 5 mg of oxycodone.  Narcan drip was discontinued on 3/20 and he was transferred out of the ICU. ? ?See below for additional details ?   ? ? ?Assessment & Plan: ?  ?Principal Problem: ?  Osteomyelitis ([place]) ?Active Problems: ?  Severe sepsis ([place]) ?  Stroke [facility]) ?  Acute metabolic encephalopathy ?  ESRD (end stage renal disease) ([place]) ?  DM2 (diabetes mellitus, type 2) ([place]) ?  HTN (hypertension) ?  Chronic systolic CHF (congestive heart failure) ([place]) ?  Acute respiratory failure with hypoxia ([place]) ?  Protein-calorie malnutrition, mild ([place]) ?  GERD (gastroesophageal reflux disease) ?  Carotid stenosis ?  Hyponatremia ?  Hyperkalemia ?  High anion gap metabolic acidosis ?  Anemia of chronic renal failure ?  Necrotizing soft tissue infection ?  Cellulitis in diabetic foot ([place]) ?  Bacteremia ?  Right foot infection ? ? 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 ?Blood cultures from 3/19 with morganella morganii and clostridium species  ?MRI foot/ankle right with soft tissue ulceration of lateral midfoot just lateral to postsurgical remaining base of 5th metatarsal and base of 4th metatarsal, marrow edema within base of 5th greater than lateral base of 4th metatarsals concerning for acute osteomyelitis (see report) ?Podiatry planning for debridement and wound vac over weekend - would avoid perioperative hypotension given stroke below ?Appreciate ID assistance, continuing vanc, flagyl, and cefepime - low suspicion for endocarditis per ID, TTE without eivdence of valvular vegetations on TTE (per ID, "do not feel strongly regarding getting TEE from ID standpoint given low grade bacteremia with GNR/GPR with right foot being source, would defer need of TEE to neurology if helpful for stroke workup - will follow with neurology).  Recommending 6 weeks of abx after last debridement intervention. ? ? ?Stroke [facility]) ?MRI brain 3/22 with numerous punctate acute infarctions throughout both cerebral hemispheres, primarily affecting the deep brain ?Echo with Ef 45-50%, global hypokinesis, RVSF mildly reduced ?CTA neck 3/20 without LVO or hemodynamically significant stenosis of intracranial arteries, did have 70% stenosis for R ICA due to mixed density atherosclerosis ?CT venogram normal ?LDL 46, A1c 12.2 ?Appreciate neurology recommendations - suspect likely 2/2 hypoperfusion with intermittent hypotension.  Aspirin 81 mg daily.  No statin at this time, LDL at goal. ? ? ?Acute metabolic encephalopathy ?Related to stroke as well as infection and  acute hospitalization ?improved ?Follow EEG - mild diffuse encephalopathy ?Appreciate neurology, concern for cytotoxic lesion of corpus callosum on MRI, likely related to metabolic derangement -> hyperglycemia, uremia, sepsis,  etc ?Delirium precautions, will treat underlying cause ? ? ?ESRD (end stage renal disease) ([place]) ?Per renal ?Being evaluated outpatient for possible transplant ?S/p L arm fistulogram with vascular 3/23 -> L central angioplasty innominate and subclavian vein, L peripheral angioplasty cephalic vein ? ?DM2 (diabetes mellitus, type 2) ([place]) ?a1c 12.2 ?Reduced basal insulin, continue SSI ?At home taking 30 units daily ? ? ?HTN (hypertension) ?Intermittent hypotension, concern that this may have resulted in watershed infarcts ?Will hold BP meds for now, not on anything at home ? ?Acute respiratory failure with hypoxia ([place]) ?Currently has been weaned to ra, continue to monitor ?CXR 3/20 without frank edema ? ?Chronic systolic CHF (congestive heart failure) ([place]) ?EF 45% per echo 05/2021 ?Echo 3/22 with EF 45-50% ?Volume per renal ? ?Carotid stenosis ?Outpatient vascular follow up ? ?GERD (gastroesophageal reflux disease) ?Continue Protonix ? ? ?DVT prophylaxis: heparin ?Code Status: full ?Family Communication: daughter 3/22 - called today, no answer ?Disposition:  ? ?Status is: Inpatient ?Remains inpatient appropriate because: need for IV abx, continued w/u encephalopathy ?  ?Consultants:  ?Neurology ?Vascular ?ID ?Renal ?podiatry ? ?Procedures:  ?left brachiocephalic arteriovenous fistula cannulation under ultrasound guidance ?left arm fistulogram including central venogram ?left central angioplasty innominate and subclavian vein (10 mm Mustang, 12 mm Mustang, 12 mm drug coated Lutonix) ?left peripheral angioplasty cephalic vein (8 mm Mustang) ? ?Echo ?IMPRESSIONS  ? ? ? 1. Left ventricular ejection fraction, by estimation, is 45 to 50%. The  ?left ventricle has mildly decreased function. The left ventricle  ?demonstrates global hypokinesis. There is moderate left ventricular  ?hypertrophy. Left ventricular diastolic function  ? could not be evaluated.  ? 2. Right ventricular systolic function is mildly reduced. The right   ?ventricular size is normal.  ? 3. The mitral valve is degenerative. Mild mitral valve regurgitation. No  ?evidence of mitral stenosis. Moderate to severe mitral annular  ?calcification.  ? 4. The aortic valve is tricuspid. There is moderate calcification of the  ?aortic valve. There is moderate thickening of the aortic valve. Aortic  ?valve regurgitation is not visualized. Mild aortic valve stenosis. Aortic  ?valve area, by VTI measures 2.26  ?cm?Marland Kitchen Aortic valve mean gradient measures 8.0 mmHg. Aortic valve Vmax  ?measures 1.92 m/s.  ? ?Conclusion(s)/Recommendation(s): No evidence of valvular vegetations on  ?this transthoracic echocardiogram. Consider Azaria Stegman transesophageal  ?echocardiogram to exclude infective endocarditis if clinically indicated. ? ?Burnard Bunting ?Summary:  ?Right: Resting right ankle-brachial index is within normal range. No  ?evidence of significant right lower extremity arterial disease. The right  ?toe-brachial index is normal.  ? ?Left: Resting left ankle-brachial index indicates noncompressible left  ?lower extremity arteries. The left toe-brachial index is abnormal. ? ?Antimicrobials:  ?Anti-infectives (From admission, onward)  ? ? Start     Dose/Rate Route Frequency Ordered Stop  ? 02/03/22 2200  metroNIDAZOLE (FLAGYL) tablet 500 mg       ? 500 mg Oral Every 12 hours 02/03/22 1108 03/22/22 2359  ? 02/03/22 2000  ceFEPIme (MAXIPIME) 2 g in sodium chloride 0.9 % 100 mL IVPB       ? 2 g ?200 mL/hr over 30 Minutes Intravenous Every M-W-F (2000) 02/02/22 0459 03/22/22 2359  ? 02/03/22 1200  vancomycin (VANCOCIN) IVPB 1000 mg/200 mL premix       ? 1,000 mg ?200 mL/hr over 60  Minutes Intravenous Every M-W-F (Hemodialysis) 02/02/22 0459 03/22/22 2359  ? 02/03/22 1030  metroNIDAZOLE (FLAGYL) IVPB 500 mg  Status:  Discontinued       ? 500 mg ?100 mL/hr over 60 Minutes Intravenous Every 12 hours 02/03/22 0934 02/03/22 1108  ? 02/02/22 1000  metroNIDAZOLE (FLAGYL) tablet 500 mg  Status:  Discontinued       ? 500  mg Oral Every 12 hours 02/02/22 0354 02/03/22 0934  ? 02/02/22 0130  ceFEPIme (MAXIPIME) 2 g in sodium chloride 0.9 % 100 mL IVPB       ? 2 g ?200 mL/hr over 30 Minutes Intravenous  Once 02/02/22 0119 02/02/22 02

## 2022-02-07 NOTE — Progress Notes (Signed)
OT Cancellation Note ? ?Patient Details ?Name: [name] AZEVEDO Sr. ?MRN: [id number] ?DOB: 1961-08-06 ? ? ?Cancelled Treatment:    Reason Eval/Treat Not Completed: Patient at procedure or test/ unavailable Pt at HD. Will follow-up for OT session as schedule permits.  ? ?Layla Maw ?02/07/2022, 11:38 AM ?

## 2022-02-07 NOTE — Progress Notes (Addendum)
Pharmacy Antibiotic Note ? ?[name] WISHAM Sr. is a 61 y.o. male admitted on 02/02/2022 with bacteremia secondary to foot abscess.  Pharmacy has been consulted for vancomycin and cefepime dosing. Patient has HD MWF and gets vancomycin 1 g IV post HD. VR 27. New BCx result with morganella morganii, clostridium species resistant to ampicillin, unasyn, and cefazolin. Continue all abx for 6 weeks after last I&D (stop date: 03/22/22). ? ?Plan: ?Continue vancomycin 1 g qMWF post HD. ?Continue cefepime 2 g IV qMWF post HD ?Continue flagyl 500 mg PO BID ? ?Height: 6\' 1"  (185.4 cm) ?Weight: 117.7 kg (259 lb 7.7 oz) ?IBW/kg (Calculated) : 79.9 ? ?Temp (24hrs), Avg:98.4 ?F (36.9 ?C), Min:97.7 ?F (36.5 ?C), Max:99 ?F (37.2 ?C) ? ?Recent Labs  ?Lab 02/02/22 ?0107 02/02/22 ?0329 02/02/22 ?0604 02/02/22 ?0920 02/03/22 ?0037 02/03/22 ?0408 02/04/22 ?8242 02/05/22 ?0144 02/06/22 ?0132 02/07/22 ?3536  ?WBC 12.0*  --   --    < > 13.6* 11.6* 11.0* 11.3* 10.2 10.2  ?CREATININE 11.09*  --  11.09*  --  13.10*  13.19* 13.14* 9.58* 11.23* 8.57* 11.45*  ?LATICACIDVEN 3.1* 2.2* 2.5*  --  2.6* 2.5*  --   --   --   --   ?VANCORANDOM  --   --   --   --   --   --   --   --   --  27  ? < > = values in this interval not displayed.  ?  ?Estimated Creatinine Clearance: 9.3 mL/min (A) (by C-G formula based on SCr of 11.45 mg/dL (H)).   ? ?Allergies  ?Allergen Reactions  ? Sulfa Antibiotics Nausea And Vomiting  ? ? ?Antimicrobials this admission: ?Cefepime 3/20 >>  ?Metronidazole 3/20 >>  ?Vancomycin 3/20 >> ? ?Dose adjustments this admission: ?None. ? ?Microbiology results: ?3/19 BCx: morganella morganii (resistant to amp, unasyn, and cefazolin) ?3/19 WCx morganella morganii (resistant to amp, unasyn, and cefazolin) ?3/20 MRSA PCR surgical: positive ? ? ?Thank you for allowing pharmacy to be a part of this patient?s care. ? ?Varney Daily, PharmD ?[id number] Pharmacy Resident ? ?Please check AMION for all [facility] pharmacy phone numbers ?After 10:00 PM call main  pharmacy [phone] ? ? ?

## 2022-02-07 NOTE — Progress Notes (Signed)
?[facility] ?Progress Note  ? ?Subjective: Seen in HD. Quick to respond, more lucid.  ? ?Objective ?Vitals:  ? 02/07/22 1000 02/07/22 1059 02/07/22 1111 02/07/22 1130  ?BP: (!) 160/85 (!) 144/73 (!) 114/55 116/64  ?Pulse: 74 74 73 71  ?Resp: 15 (!) 22    ?Temp:  98.4 ?F (36.9 ?C)    ?TempSrc:      ?SpO2: 100%     ?Weight:  120.1 kg    ?Height:      ? ?Physical Exam ?General: Chronically ill appearing male in NAD ?Neuro: responds verbally and accurately ?Heart: S1,S2 No M/R/G ?Lungs: CTAB, no WOB ?Abdomen: Obese, NT, ND ?Extremities:no LE edema ACE wrap R foot.  ?Dialysis Access: L AVF with thinning area lower portion on AVF. +T/B. Suture intact post F'gram ?  ? ?Dialysis Orders:  MWF [facility] ?4h 3min  450/500    117.8kg   2/ 2.5Ca bath  Hep 6000  L AVF ? - on 3/19 > hep B Ag neg, and Hep B Ab's high/ protective ? - calcitriol 1.67mcg PO qHD-last 01/31/22 ?  ?Assessment/Plan: ?Osteomyelitis-Podiatry following, on IV vanc/cefepime/Flagyl; ID following. Morganella bacteremia 2/2 foot abscess. 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 Will need 6 weeks ABX for attempted limb salvage.  ?Acute metabolic encephalopathy: Improving. Per primary.  ?CVA: Neurology consulted. MRI done 02/04/2022 Numerous punctate acute infarctions throughout cerebral hemispheres. Per primary.  ?HD access: AVF w/ thinning, ulcerated area at the lower portion. Stick away from area. VVS consulted. Went for F'gram on 3/23 by VVS, did L central venoplasty to innominate and subclavian, and peripherally to the cephalic vein.  ?ESRD - on HD MWF. HD today.  ?Hypertension/volume  - Blood pressures acceptable; euvolemic on exam. UF as tolerated. Avoid hypotension during HD. Keep SBP >120.  ?Anemia of CKD - Hgb 11.2; No ESA/Fe needs at this time ?Secondary Hyperparathyroidism -  CCa okay. Continue VDRA with HD. Phos in range lower part.  ?Nutrition - Advance to renal diet with fluid restriction when more  awake ? ? ?[name] Splinter, MD ?02/07/2022, 12:06 PM ? ? ? ? ?Recent Labs  ?Lab 02/03/22 ?0408 02/04/22 ?4540 02/05/22 ?0144 02/06/22 ?0132 02/07/22 ?9811  ?HGB 11.6* 11.3*   < > 11.3* 11.2*  ?ALBUMIN  --  2.2*  --   --  2.3*  ?CALCIUM 8.7* 8.5*   < > 8.6* 9.0  ?PHOS 3.4  --   --   --  2.7  ?CREATININE 13.14* 9.58*   < > 8.57* 11.45*  ?K 6.1* 3.7   < > 3.9 4.1  ? < > = values in this interval not displayed.  ? ? ? ?Medications: ? sodium chloride    ? sodium chloride    ? ceFEPime (MAXIPIME) IV Stopped (02/05/22 2104)  ? sodium chloride    ? vancomycin Stopped (02/05/22 1901)  ? ? (feeding supplement) PROSource Plus  30 mL Oral BID BM  ? vitamin C  500 mg Oral Daily  ? aspirin EC  81 mg Oral Daily  ? Chlorhexidine Gluconate Cloth  6 each Topical Q0600  ? famotidine  20 mg Oral Daily  ? feeding supplement (NEPRO CARB STEADY)  237 mL Oral BID BM  ? heparin  5,000 Units Subcutaneous Q8H  ? insulin aspart  0-20 Units Subcutaneous Q4H  ? insulin glargine-yfgn  20 Units Subcutaneous Daily  ? metroNIDAZOLE  500 mg Oral Q12H  ? multivitamin  1 tablet Oral QHS  ?  mupirocin ointment  1 application. Nasal BID  ? pantoprazole  40 mg Oral Q1200  ? sucroferric oxyhydroxide  1,000 mg Oral TID WC  ? thiamine injection  100 mg Intravenous Daily  ? Or  ? thiamine  100 mg Oral Daily  ? zinc sulfate  220 mg Oral BID  ? ? ? ? ?  ? ?

## 2022-02-07 NOTE — Progress Notes (Signed)
Subjective: ?[name] HAMMACK Sr. is a 61 y.o. male patient seen at bedside, resting comfortably in no acute distress with neurology resident present.  Patient is s/p day # 5 right I&D with revision of fifth ray amputation site, performed by Dr. Blenda Mounts. Patient does not voice any concerns and seems more alert and oriented during this encounter.  No other issues noted. ? ?Daughter not present at bedside this visit. ? ?Patient Active Problem List  ? Diagnosis Date Noted  ? Right foot infection   ? Stroke [facility]) 02/05/2022  ? Carotid stenosis 02/05/2022  ? Bacteremia   ? Hyponatremia 02/03/2022  ? Hyperkalemia 02/03/2022  ? High anion gap metabolic acidosis 27/01/5008  ? Anemia of chronic renal failure 02/03/2022  ? Necrotizing soft tissue infection   ? Cellulitis in diabetic foot ([place])   ? Osteomyelitis ([place]) 02/02/2022  ? Acute metabolic encephalopathy 38/18/2993  ? Severe sepsis ([place]) 02/02/2022  ? Acute respiratory failure with hypoxia ([place]) 02/02/2022  ? Protein-calorie malnutrition, mild ([place]) 02/02/2022  ? GERD (gastroesophageal reflux disease) 02/02/2022  ? Chronic systolic CHF (congestive heart failure) ([place]) 08/14/2019  ? LBBB (left bundle branch block) 08/13/2019  ? Problem with vascular access 08/13/2019  ? Dialysis AV fistula malfunction ([place]) 08/12/2019  ? ESRD (end stage renal disease) ([place]) 09/20/2018  ? Fever 09/20/2018  ? Nausea & vomiting 09/20/2018  ? DM2 (diabetes mellitus, type 2) ([place]) 09/20/2018  ? HTN (hypertension) 09/20/2018  ? ? ? ?Current Facility-Administered Medications:  ?  (feeding supplement) PROSource Plus liquid 30 mL, 30 mL, Oral, BID BM, [name], MD, 30 mL at 02/07/22 0944 ?  acetaminophen (TYLENOL) tablet 650 mg, 650 mg, Oral, Q6H PRN, 650 mg at 02/02/22 1540 **OR** acetaminophen (TYLENOL) suppository 650 mg, 650 mg, Rectal, Q6H PRN, [name], MD ?  albuterol (PROVENTIL) (2.5 MG/3ML) 0.083% nebulizer solution 2.5 mg, 2.5 mg, Nebulization, Q2H PRN, [name], MD ?  ascorbic acid (VITAMIN C) tablet 500 mg, 500 mg, Oral, Daily, [name], MD, 500 mg at 02/07/22 0941 ?  aspirin EC tablet 81 mg, 81 mg, Oral, Daily, [name], MD, 81 mg at 02/07/22 0941 ?  ceFEPIme (MAXIPIME) 2 g in sodium chloride 0.9 % 100 mL IVPB, 2 g, Intravenous, Q M,W,F-2000, [name], MD, Stopped at 02/05/22 2104 ?  Chlorhexidine Gluconate Cloth 2 % PADS 6 each, 6 each, Topical, Q0600, [name], MD, 6 each at 02/06/22 2200 ?  docusate sodium (COLACE) capsule 100 mg, 100 mg, Oral, BID PRN, [name], MD, 100 mg at 02/06/22 2139 ?  famotidine (PEPCID) tablet 20 mg, 20 mg, Oral, Daily, [name], MD, 20 mg at 02/06/22 2139 ?  feeding supplement (NEPRO CARB STEADY) liquid 237 mL, 237 mL, Oral, BID BM, [name], MD, 237 mL at 02/07/22 7169 ?  heparin injection 5,000 Units, 5,000 Units, Subcutaneous, Q8H, [name], MD, 5,000 Units at 02/07/22 6789 ?  insulin aspart (novoLOG) injection 0-20 Units, 0-20 Units, Subcutaneous, Q4H, [name], MD, 3 Units at 02/07/22 0900 ?  insulin glargine-yfgn (SEMGLEE) injection 20 Units, 20 Units, Subcutaneous, Daily, [name], MD, 20 Units at 02/07/22 [phone]  metroNIDAZOLE (FLAGYL) tablet 500 mg, 500 mg, Oral, Q12H, Manandhar, Sabina, MD, 500 mg at 02/07/22 0941 ?  multivitamin (RENA-VIT) tablet 1 tablet, 1 tablet, Oral, QHS, [name], MD, 1 tablet at 02/06/22 2139 ?  mupirocin ointment (BACTROBAN) 2 % 1 application., 1 application., Nasal,  BID, [name], MD, 1 application. at 02/07/22 0945 ?  ondansetron (ZOFRAN) tablet 4 mg, 4 mg, Oral, Q6H PRN **OR** ondansetron (ZOFRAN) injection 4 mg, 4 mg, Intravenous, Q6H PRN, [name], MD ?  pantoprazole (PROTONIX) EC tablet 40 mg, 40 mg, Oral, Q1200, [name], MD, 40 mg at 02/06/22 0957 ?  polyethylene glycol (MIRALAX / GLYCOLAX) packet 17 g, 17 g, Oral, Daily  PRN, [name], MD, 17 g at 02/06/22 2139 ?  sodium chloride 0.9 % bolus 250 mL, 250 mL, Intravenous, Once PRN, [name], MD ?  sucroferric oxyhydroxide [facility]) chewable tablet 1,000 mg, 1,000 mg, Oral, TID WC, [name], MD, 1,000 mg at 02/07/22 6962 ?  thiamine (B-1) injection 100 mg, 100 mg, Intravenous, Daily, 100 mg at 02/07/22 9528 **OR** thiamine tablet 100 mg, 100 mg, Oral, Daily, [name], MD, 100 mg at 02/06/22 4132 ?  vancomycin (VANCOCIN) IVPB 1000 mg/200 mL premix, 1,000 mg, Intravenous, Q M,W,F-HD, [name], MD, Stopped at 02/07/22 1640 ?  zinc sulfate capsule 220 mg, 220 mg, Oral, BID, [name], MD, 220 mg at 02/07/22 0941 ? ?Allergies  ?Allergen Reactions  ? Sulfa Antibiotics Nausea And Vomiting  ? ? ? ?Objective: ?Today's Vitals  ? 02/07/22 1400 02/07/22 1430 02/07/22 1521 02/07/22 1644  ?BP: (!) 98/41 111/63 115/67 (!) 150/71  ?Pulse: (!) 48 71 71 75  ?Resp:  16 16 18   ?Temp:    98.5 ?F (36.9 ?C)  ?TempSrc:    Oral  ?SpO2:   100% 95%  ?Weight:   118 kg   ?Height:      ?PainSc:   0-No pain   ? ? ?General: No acute distress ? ?Right Lower extremity: Dressing to Right foot clean, dry, intact. No strikethrough noted, Upon removal of dressings there is a large wound at the dorsal lateral right foot that measures greater than 5 cm with exposed subcutaneous tissue tendon and bone at the proximal resection site of the revised fifth ray amputation, unchanged from yesterday's exam, there is small areas of marginal necrosis and superior to the wound there is a necrotic blister that appears to be about the same with localized edema and erythema at the dorsal lateral foot extending to the ankle on the right.  Pedal pulses are present on the right.  No obvious pain or grimace with palpation to foot or calf. ? ? ? ?Assessment and Plan:  ?Problem List Items Addressed This Visit   ? ?  ? Genitourinary  ? ESRD (end stage renal disease) ([place]) -  Primary  ?  Per renal ?Being evaluated outpatient for possible transplant ?S/p L arm fistulogram with vascular 3/23 -> L central angioplasty innominate and subclavian vein, L peripheral angioplasty cephalic vein ?  ?  ?  ? Other  ? Right foot infection  ? Relevant Medications  ? vancomycin (VANCOCIN) IVPB 1000 mg/200 mL premix  ? ceFEPIme (MAXIPIME) 2 g in sodium chloride 0.9 % 100 mL IVPB  ? mupirocin ointment (BACTROBAN) 2 % 1 application.  ? metroNIDAZOLE (FLAGYL) tablet 500 mg  ? ?Other Visit Diagnoses   ? ? Delirium      ? ?  ? ? ?-Patient seen and evaluated at bedside ?-Dressing change performed applied saline wet-to-dry dressing on the right nursing to continue to assist with dressing changes as ordered as well if there is any strikethrough ?-Secondary conversation had with patient's daughter and with the patient directly at bedside in  regards to repeat surgery and debridement.  Patient and daughter are agreeable with plan for right foot irrigation and debridement and removal of more bone along the fourth and fifth metatarsals.  After this debridement bedside in 1 to 2 days if the wound bed is looking good we will have the wound care nurse to place a wound VAC.  Alternatives risk benefits explained no guarantees given and patient and daughter is well aware that infection can continue to progress and compromise the foot leg or his life if not properly treated or controlled.  Patient to be n.p.o. at midnight for OR on tomorrow.  Consent to be obtained. ?-Continue with antibiotics as directed by infectious disease ?-Continue with rest and elevation to assist with pain and edema control  ?-Podiatry will continue to follow closely. ? ?[name], DPM ?[facility] ?[phone] office ?[phone] cell ?Available via secure chat  ?

## 2022-02-07 NOTE — Progress Notes (Signed)
removed 2064mls net fluid.  unable to remove more due to cramping.  pre bp 144/73 post bp 115/67  pre weight 120.1kg post weight 118.0kg bed scales.  removed stitch from left antecubital area from previous fistulogram.  2 bandages to lua avf no bleeding dressing cdi.  gave vancomycin as ordered.  ?

## 2022-02-07 NOTE — Progress Notes (Addendum)
ID brief note ? ?Remains afebrile, no leukocytosis ? ?S/p left central angioplasty innominate and subclavian vein and left peripheral angioplasty cephalic vein on 7/90 by [facility] ? ?3/19 Blood cx 1/2 sets updated as Morganella morganii and clostridium spp ? ?3/19 tissue cx Morganella morganii, Bacteroides thetaiotaomicron and E faecalis  ? ?Continue Vancomycin and cefepime with HD and PO metrindazole ?Duration 6 weeks from last debridement, likely this weekend ?CVA work up per primary and neurology  ?Will follow up in the clinic ?ID available as needed. Please call with questions  ? ? ?[name], MD ?Infectious Disease Physician ?[facility] ?[address] ?[place], [place] [address] ?Phone: [phone]  Fax: [phone] ? ?

## 2022-02-08 ENCOUNTER — Inpatient Hospital Stay (HOSPITAL_COMMUNITY): Payer: Medicare Other | Admitting: Anesthesiology

## 2022-02-08 ENCOUNTER — Inpatient Hospital Stay (HOSPITAL_COMMUNITY): Payer: Medicare Other

## 2022-02-08 ENCOUNTER — Other Ambulatory Visit: Payer: Self-pay

## 2022-02-08 ENCOUNTER — Encounter (HOSPITAL_COMMUNITY): Payer: Self-pay | Admitting: Family Medicine

## 2022-02-08 ENCOUNTER — Encounter (HOSPITAL_COMMUNITY)
Admission: EM | Disposition: A | Discharge status: Skilled Nursing Facility | Payer: Self-pay | Source: Home / Self Care | Attending: Internal Medicine

## 2022-02-08 DIAGNOSIS — M869 Osteomyelitis, unspecified: Secondary | ICD-10-CM

## 2022-02-08 DIAGNOSIS — I509 Heart failure, unspecified: Secondary | ICD-10-CM

## 2022-02-08 DIAGNOSIS — I132 Hypertensive heart and chronic kidney disease with heart failure and with stage 5 chronic kidney disease, or end stage renal disease: Secondary | ICD-10-CM

## 2022-02-08 DIAGNOSIS — I959 Hypotension, unspecified: Secondary | ICD-10-CM

## 2022-02-08 DIAGNOSIS — N189 Chronic kidney disease, unspecified: Secondary | ICD-10-CM

## 2022-02-08 DIAGNOSIS — M86671 Other chronic osteomyelitis, right ankle and foot: Secondary | ICD-10-CM

## 2022-02-08 DIAGNOSIS — Z794 Long term (current) use of insulin: Secondary | ICD-10-CM

## 2022-02-08 DIAGNOSIS — Z992 Dependence on renal dialysis: Secondary | ICD-10-CM

## 2022-02-08 DIAGNOSIS — E1169 Type 2 diabetes mellitus with other specified complication: Secondary | ICD-10-CM

## 2022-02-08 HISTORY — PX: INCISION AND DRAINAGE OF WOUND: SHX1803

## 2022-02-08 LAB — MAGNESIUM: Magnesium: 2 mg/dL (ref 1.7–2.4)

## 2022-02-08 LAB — CBC WITH DIFFERENTIAL/PLATELET
Abs Immature Granulocytes: 0.32 10*3/uL — ABNORMAL HIGH (ref 0.00–0.07)
Basophils Absolute: 0.1 10*3/uL (ref 0.0–0.1)
Basophils Relative: 1 %
Eosinophils Absolute: 0.5 10*3/uL (ref 0.0–0.5)
Eosinophils Relative: 5 %
HCT: 34.2 % — ABNORMAL LOW (ref 39.0–52.0)
Hemoglobin: 11 g/dL — ABNORMAL LOW (ref 13.0–17.0)
Immature Granulocytes: 3 %
Lymphocytes Relative: 15 %
Lymphs Abs: 1.6 10*3/uL (ref 0.7–4.0)
MCH: 30.2 pg (ref 26.0–34.0)
MCHC: 32.2 g/dL (ref 30.0–36.0)
MCV: 94 fL (ref 80.0–100.0)
Monocytes Absolute: 1.6 10*3/uL — ABNORMAL HIGH (ref 0.1–1.0)
Monocytes Relative: 14 %
Neutro Abs: 6.7 10*3/uL (ref 1.7–7.7)
Neutrophils Relative %: 62 %
Platelets: 351 10*3/uL (ref 150–400)
RBC: 3.64 MIL/uL — ABNORMAL LOW (ref 4.22–5.81)
RDW: 16.9 % — ABNORMAL HIGH (ref 11.5–15.5)
WBC: 10.7 10*3/uL — ABNORMAL HIGH (ref 4.0–10.5)
nRBC: 0.5 % — ABNORMAL HIGH (ref 0.0–0.2)

## 2022-02-08 LAB — COMPREHENSIVE METABOLIC PANEL
ALT: 23 U/L (ref 0–44)
AST: 30 U/L (ref 15–41)
Albumin: 2.1 g/dL — ABNORMAL LOW (ref 3.5–5.0)
Alkaline Phosphatase: 100 U/L (ref 38–126)
Anion gap: 10 (ref 5–15)
BUN: 32 mg/dL — ABNORMAL HIGH (ref 6–20)
CO2: 25 mmol/L (ref 22–32)
Calcium: 8.4 mg/dL — ABNORMAL LOW (ref 8.9–10.3)
Chloride: 96 mmol/L — ABNORMAL LOW (ref 98–111)
Creatinine, Ser: 8.77 mg/dL — ABNORMAL HIGH (ref 0.61–1.24)
GFR, Estimated: 6 mL/min — ABNORMAL LOW (ref >60)
Glucose, Bld: 171 mg/dL — ABNORMAL HIGH (ref 70–99)
Potassium: 4 mmol/L (ref 3.5–5.1)
Sodium: 131 mmol/L — ABNORMAL LOW (ref 135–145)
Total Bilirubin: 0.8 mg/dL (ref 0.3–1.2)
Total Protein: 7.1 g/dL (ref 6.5–8.1)

## 2022-02-08 LAB — HEMOGLOBIN AND HEMATOCRIT, BLOOD
HCT: 28.3 % — ABNORMAL LOW (ref 39.0–52.0)
Hemoglobin: 9.2 g/dL — ABNORMAL LOW (ref 13.0–17.0)

## 2022-02-08 LAB — GLUCOSE, CAPILLARY
Glucose-Capillary: 180 mg/dL — ABNORMAL HIGH (ref 70–99)
Glucose-Capillary: 171 mg/dL — ABNORMAL HIGH (ref 70–99)
Glucose-Capillary: 152 mg/dL — ABNORMAL HIGH (ref 70–99)
Glucose-Capillary: 189 mg/dL — ABNORMAL HIGH (ref 70–99)
Glucose-Capillary: 211 mg/dL — ABNORMAL HIGH (ref 70–99)
Glucose-Capillary: 281 mg/dL — ABNORMAL HIGH (ref 70–99)
Glucose-Capillary: 233 mg/dL — ABNORMAL HIGH (ref 70–99)

## 2022-02-08 LAB — PHOSPHORUS: Phosphorus: 2.6 mg/dL (ref 2.5–4.6)

## 2022-02-08 LAB — ABO/RH: ABO/RH(D): A POS

## 2022-02-08 SURGERY — IRRIGATION AND DEBRIDEMENT WOUND
Anesthesia: Monitor Anesthesia Care | Site: Foot | Laterality: Right

## 2022-02-08 MED ORDER — LIDOCAINE HCL 1 % IJ SOLN
INTRAMUSCULAR | Status: AC
  Filled 2022-02-08: qty 20

## 2022-02-08 MED ORDER — PHENYLEPHRINE 40 MCG/ML (10ML) SYRINGE FOR IV PUSH (FOR BLOOD PRESSURE SUPPORT)
PREFILLED_SYRINGE | INTRAVENOUS | Status: DC | PRN
  Administered 2022-02-08 (×3): 80 ug via INTRAVENOUS

## 2022-02-08 MED ORDER — SODIUM CHLORIDE 0.9 % IR SOLN
Status: DC | PRN
Start: 2022-02-08 — End: 2022-02-08
  Administered 2022-02-08: 250 mL

## 2022-02-08 MED ORDER — ALBUMIN HUMAN 5 % IV SOLN: INTRAVENOUS | Status: DC | PRN

## 2022-02-08 MED ORDER — FENTANYL CITRATE (PF) 250 MCG/5ML IJ SOLN
INTRAMUSCULAR | Status: AC
  Filled 2022-02-08: qty 5

## 2022-02-08 MED ORDER — PHENYLEPHRINE 40 MCG/ML (10ML) SYRINGE FOR IV PUSH (FOR BLOOD PRESSURE SUPPORT)
PREFILLED_SYRINGE | INTRAVENOUS | Status: AC
  Filled 2022-02-08: qty 10

## 2022-02-08 MED ORDER — OXYCODONE HCL 5 MG PO TABS
5.0000 mg | ORAL_TABLET | Freq: Four times a day (QID) | ORAL | Status: DC | PRN
  Administered 2022-02-08 – 2022-02-11 (×5): 5 mg via ORAL
  Filled 2022-02-08 (×5): qty 1

## 2022-02-08 MED ORDER — PROPOFOL 500 MG/50ML IV EMUL
INTRAVENOUS | Status: DC | PRN
  Administered 2022-02-08: 25 ug/kg/min via INTRAVENOUS

## 2022-02-08 MED ORDER — LACTATED RINGERS IV BOLUS: 500.0000 mL | Freq: Once | INTRAVENOUS | Status: DC

## 2022-02-08 MED ORDER — SODIUM CHLORIDE 0.9 % IV SOLN
INTRAVENOUS | Status: DC
Start: 2022-02-08 — End: 2022-02-08

## 2022-02-08 MED ORDER — LACTATED RINGERS IV BOLUS: 250.0000 mL | Freq: Once | INTRAVENOUS | Status: DC

## 2022-02-08 MED ORDER — 0.9 % SODIUM CHLORIDE (POUR BTL) OPTIME
TOPICAL | Status: DC | PRN
  Administered 2022-02-08: 1000 mL

## 2022-02-08 MED ORDER — ACETAMINOPHEN 325 MG PO TABS
650.0000 mg | ORAL_TABLET | Freq: Four times a day (QID) | ORAL | Status: DC | PRN
  Filled 2022-02-08: qty 2

## 2022-02-08 MED ORDER — INSULIN ASPART 100 UNIT/ML IJ SOLN: 0.0000 [IU] | INTRAMUSCULAR | Status: DC | PRN

## 2022-02-08 MED ORDER — CHLORHEXIDINE GLUCONATE 0.12 % MT SOLN
OROMUCOSAL | Status: AC
  Administered 2022-02-08: 15 mL via OROMUCOSAL
  Filled 2022-02-08: qty 15

## 2022-02-08 MED ORDER — LACTATED RINGERS IV BOLUS
250.0000 mL | Freq: Once | INTRAVENOUS | Status: AC
  Administered 2022-02-08: 250 mL via INTRAVENOUS

## 2022-02-08 MED ORDER — FENTANYL CITRATE (PF) 250 MCG/5ML IJ SOLN
INTRAMUSCULAR | Status: DC | PRN
  Administered 2022-02-08: 25 ug via INTRAVENOUS

## 2022-02-08 MED ORDER — ACETAMINOPHEN 500 MG PO TABS
1000.0000 mg | ORAL_TABLET | Freq: Three times a day (TID) | ORAL | Status: AC
  Administered 2022-02-08 – 2022-02-11 (×7): 1000 mg via ORAL
  Filled 2022-02-08 (×7): qty 2

## 2022-02-08 MED ORDER — FENTANYL CITRATE (PF) 100 MCG/2ML IJ SOLN: 25.0000 ug | INTRAMUSCULAR | Status: DC | PRN

## 2022-02-08 MED ORDER — PROPOFOL 10 MG/ML IV BOLUS
INTRAVENOUS | Status: AC
  Filled 2022-02-08: qty 20

## 2022-02-08 MED ORDER — BUPIVACAINE HCL 0.25 % IJ SOLN
INTRAMUSCULAR | Status: DC | PRN
  Administered 2022-02-08: 20 mL

## 2022-02-08 MED ORDER — CHLORHEXIDINE GLUCONATE 0.12 % MT SOLN: 15.0000 mL | Freq: Once | OROMUCOSAL | Status: AC

## 2022-02-08 MED ORDER — MIDAZOLAM HCL 2 MG/2ML IJ SOLN
INTRAMUSCULAR | Status: AC
  Filled 2022-02-08: qty 2

## 2022-02-08 MED ORDER — ONDANSETRON HCL 4 MG/2ML IJ SOLN
INTRAMUSCULAR | Status: DC | PRN
  Administered 2022-02-08: 4 mg via INTRAVENOUS

## 2022-02-08 MED ORDER — BUPIVACAINE HCL (PF) 0.25 % IJ SOLN
INTRAMUSCULAR | Status: AC
  Filled 2022-02-08: qty 30

## 2022-02-08 MED ORDER — FENTANYL CITRATE PF 50 MCG/ML IJ SOSY
12.5000 ug | PREFILLED_SYRINGE | INTRAMUSCULAR | Status: DC | PRN
  Administered 2022-02-08: 12.5 ug via INTRAVENOUS
  Filled 2022-02-08: qty 1

## 2022-02-08 MED ORDER — PROPOFOL 10 MG/ML IV BOLUS
INTRAVENOUS | Status: DC | PRN
  Administered 2022-02-08 (×7): 10 mg via INTRAVENOUS

## 2022-02-08 MED ORDER — PHENYLEPHRINE HCL-NACL 20-0.9 MG/250ML-% IV SOLN
INTRAVENOUS | Status: DC | PRN
  Administered 2022-02-08: 50 ug/min via INTRAVENOUS

## 2022-02-08 MED ORDER — SODIUM CHLORIDE 0.9 % IR SOLN
Status: DC | PRN
  Administered 2022-02-08: 3000 mL

## 2022-02-08 MED ORDER — LIDOCAINE HCL 1 % IJ SOLN
INTRAMUSCULAR | Status: DC | PRN
  Administered 2022-02-08: 20 mL

## 2022-02-08 SURGICAL SUPPLY — 34 items
BLADE AVERAGE 25X9 (BLADE) ×2
BLADE SURG 15 STRL LF DISP TIS (BLADE) ×1
BLADE SURG 15 STRL SS (BLADE) ×2
BNDG COHESIVE 4X5 TAN ST LF (GAUZE/BANDAGES/DRESSINGS) ×2
BNDG ELASTIC 4X5.8 VLCR STR LF (GAUZE/BANDAGES/DRESSINGS) ×2
BNDG GAUZE ELAST 4 BULKY (GAUZE/BANDAGES/DRESSINGS) ×4
COVER SURGICAL LIGHT HANDLE (MISCELLANEOUS) ×2
ELECT REM PT RETURN 9FT ADLT (ELECTROSURGICAL) ×2
GAUZE 4X4 16PLY ~~LOC~~+RFID DBL (SPONGE) ×2
GAUZE PACKING IODOFORM 1X5 (PACKING) ×2
GAUZE SPONGE 4X4 12PLY STRL (GAUZE/BANDAGES/DRESSINGS) ×4
GLOVE SURG ENC MOIS LTX SZ6.5 (GLOVE) ×2
GLOVE SURG UNDER POLY LF SZ6.5 (GLOVE) ×2
GOWN STRL REUS W/ TWL LRG LVL3 (GOWN DISPOSABLE) ×2
GOWN STRL REUS W/TWL LRG LVL3 (GOWN DISPOSABLE) ×4
HANDPIECE INTERPULSE COAX TIP (DISPOSABLE) ×1
HYDROGEN PEROXIDE 16OZ (MISCELLANEOUS) ×2
KIT BASIN OR (CUSTOM PROCEDURE TRAY) ×2
KIT TURNOVER KIT B (KITS) ×2
NEEDLE 22X1 1/2 (OR ONLY) (NEEDLE) ×4
NS IRRIG 1000ML POUR BTL (IV SOLUTION) ×2
PACK ORTHO EXTREMITY (CUSTOM PROCEDURE TRAY) ×2
PAD ABD 8X10 STRL (GAUZE/BANDAGES/DRESSINGS) ×4
PROBE DEBRIDE SONICVAC MISONIX (TIP) ×2
SET HNDPC FAN SPRY TIP SCT (DISPOSABLE) ×1
SPONGE T-LAP 18X18 ~~LOC~~+RFID (SPONGE) ×6
SUCTION FRAZIER HANDLE 10FR (MISCELLANEOUS) ×1
SUCTION TUBE FRAZIER 10FR DISP (MISCELLANEOUS) ×1
SYR CONTROL 10ML LL (SYRINGE) ×4
TOWEL GREEN STERILE (TOWEL DISPOSABLE) ×2
TOWEL GREEN STERILE FF (TOWEL DISPOSABLE) ×2
TUBE CONNECTING 12X1/4 (SUCTIONS) ×2
TUBE IRRIGATION SET MISONIX (TUBING) ×2
YANKAUER SUCT BULB TIP NO VENT (SUCTIONS) ×2

## 2022-02-08 NOTE — Anesthesia Preprocedure Evaluation (Addendum)
Anesthesia Evaluation  ?Patient identified by MRN, date of birth, ID band ?Patient awake ? ? ? ?Reviewed: ?Allergy & Precautions, H&P , NPO status , Patient's Chart, lab work & pertinent test results ? ?Airway ?Mallampati: III ? ?TM Distance: >3 FB ?Neck ROM: full ? ? ? Dental ?  ?Pulmonary ?neg pulmonary ROS, Patient abstained from smoking.,  ?  ?breath sounds clear to auscultation ? ? ? ? ? ? Cardiovascular ?hypertension, +CHF  ? ?Rhythm:regular Rate:Normal ? ?EF 45-50% ?  ?Neuro/Psych ?CVA, Residual Symptoms   ? GI/Hepatic ?GERD  Medicated,  ?Endo/Other  ?diabetes, Type 2 ? Renal/GU ?ESRF and DialysisRenal disease  ? ?  ?Musculoskeletal ? ? Abdominal ?  ?Peds ? Hematology ? ?(+) Blood dyscrasia, anemia ,   ?Anesthesia Other Findings ? ? Reproductive/Obstetrics ? ?  ? ? ? ? ? ? ? ? ? ? ? ? ? ?  ?  ? ? ? ? ? ? ? ?Anesthesia Physical ? ?Anesthesia Plan ? ?ASA: 4 ? ?Anesthesia Plan: MAC  ? ?Post-op Pain Management: Minimal or no pain anticipated and Tylenol PO (pre-op)*  ? ?Induction:  ? ?PONV Risk Score and Plan: 1 and Ondansetron, Propofol infusion, Treatment may vary due to age or medical condition and Midazolam ? ?Airway Management Planned: Simple Face Mask and Natural Airway ? ?Additional Equipment:  ? ?Intra-op Plan:  ? ?Post-operative Plan:  ? ?Informed Consent: I have reviewed the patients History and Physical, chart, labs and discussed the procedure including the risks, benefits and alternatives for the proposed anesthesia with the patient or authorized representative who has indicated his/her understanding and acceptance.  ? ? ? ?Dental advisory given ? ?Plan Discussed with: CRNA, Anesthesiologist and Surgeon ? ?Anesthesia Plan Comments:   ? ? ? ? ? ?Anesthesia Quick Evaluation ? ?

## 2022-02-08 NOTE — Anesthesia Postprocedure Evaluation (Signed)
Anesthesia Post Note ? ?Patient: [name] [name] Sr. ? ?Procedure(s) Performed: IRRIGATION AND DEBRIDEMENT WOUND (Right: Foot) ? ?  ? ?Patient location during evaluation: PACU ?Anesthesia Type: MAC ?Level of consciousness: awake and alert ?Pain management: pain level controlled ?Vital Signs Assessment: post-procedure vital signs reviewed and stable ?Respiratory status: spontaneous breathing, nonlabored ventilation, respiratory function stable and patient connected to nasal cannula oxygen ?Cardiovascular status: stable and blood pressure returned to baseline ?Postop Assessment: no apparent nausea or vomiting ?Anesthetic complications: no ? ? ?No notable events documented. ? ?Last Vitals:  ?Vitals:  ? 02/08/22 1708 02/08/22 2015  ?BP: (!) 141/66 (!) 125/58  ?Pulse: 72 73  ?Resp: 16 20  ?Temp: 36.7 ?C 36.8 ?C  ?SpO2: 100% 100%  ?  ?Last Pain:  ?Vitals:  ? 02/08/22 2015  ?TempSrc: Oral  ?PainSc:   ? ? ?  ?  ?  ?  ?  ?  ? ?Suzette Battiest E ? ? ? ? ?

## 2022-02-08 NOTE — Anesthesia Procedure Notes (Signed)
Procedure Name: [place] ?Date/Time: 02/08/2022 7:40 AM ?Performed by: Wilburn Cornelia, CRNA ?Pre-anesthesia Checklist: Patient identified, Emergency Drugs available, Suction available, Patient being monitored and Timeout performed ?Oxygen Delivery Method: Simple face mask ?Placement Confirmation: positive ETCO2 and breath sounds checked- equal and bilateral ?Dental Injury: Teeth and Oropharynx as per pre-operative assessment  ? ? ? ? ?

## 2022-02-08 NOTE — Assessment & Plan Note (Addendum)
-  Post op, likely multifactorial, blood loss, meds ?-He was weaned off pressors after small bolus post op 3/25 ?-Will continue to trend Hb/hct ?-Blood pressure stable and improving and now starting to trend up. ?-Monitor for now. ?-Continue Midodrine 10 mg pre HD. ?

## 2022-02-08 NOTE — H&P (Signed)
Brief Podiatry H&P ? ?Patient met this AM bedside in Preop holding area.  Patient confirms n.p.o. since midnight.  Patient confirms surgical site of right lower extremity which was marked.  Patient to undergo right foot irrigation and debridement with removal of additional bone at the fourth fifth metatarsals.  Patient denies having any questions about procedure.  No to guarantees given or implied.  Patient anticipated to return to medical floor.  Family will be contacted. ? ?Dr. [name] ?On Call provider  ?[facility] ?[phone] office ?[phone] cell ?Available via secure chat  ? ?

## 2022-02-08 NOTE — Op Note (Addendum)
DATE: 02-08-22 ? ?SURGEON: [name], DPM ? 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 ?POSTOPERATIVE DIAGNOSIS: Same ? ?PROCEDURE PERFORMED: Right irrigation and debridement with removal of residual bone at 4-5 metatarsals ? ?HEMOSTASIS:  Right ankle tourniquet but not inflated ? ?ESTIMATED BLOOD LOSS: 500cc ? ?ANESTHESIA:  MAC with local 40cc of 1:1 mixture 1% lidocaine and 0.5% marcaine plain ? ?SPECIMENS:  Bone ? ?MICRO: None ? ?COMPLICATIONS:  None. ? ? ?INDICATIONS FOR PROCEDURE:  This patient is a pleasant 61 y.o. diabetic male seen at bedside on several occassions for wound care s/p Right foot I&D performed 1 week ago by Dr. Blenda Mounts with continued necrosis at wound edges. Patient and his family opts for Right foot irrigation and debridement with removal of bone. Risks and complications include but are not limited to infection, recurrence of symptoms, pain, numbness, wound dehiscence, delayed healing, as well as need for future surgery/amputation.  No guarantees were given or applied.  All questions were answered to the patient?s satisfaction, and the patient has consented to the above procedure.  All preoperative labs and H&P, medical clearances have been obtained and NPO status past midnight has been confirmed. ? ?PREPARATION FOR PROCEDURE:     The patient was brought to the operating room and placed on the operating table in supine position.  A pneumatic ankletourniquet was placed about the patient's Right foot but not yet inflated.  After the department of anesthesia had administered MAC anesthesia. A local block was administered and the Right foot was then scrubbed, prepped, and draped in the usual aseptic manner. THE TOURNIQUET WAS NOT USED DURING THE CASE since patient was under very light sedation to avoid pain during case and to be safe during this procedure since patient is recovering from  recent stroke.  ? ?PROCEDURE IN DETAIL:  At this time, attention was directed to the patient?s Right foot where there was of note dorsal lateral wound that measured 7x5cm with bone and tendon exposed. Using a 15 blade the wound was excised through skin and subcutaneous tissue to bone, Bloody drainage was expressed and then using tenotomy scissors all nonviable tissue was excised from the wound bed, the infection was noted to track to the lateral ankle along the peroneal tendons which were debrided and nonviable portions were resected. Following the 4-5 metatarsal segments were identified and then using a sagittal saw were resected and sent to pathology. The wound was then further debrided using misonix ultrasound debridement device to the level of bone, post debridement wound measured 10x8x1cm. The wound bed was then pulse irrgiated with saline. The Right foot was then dressed with 1 in packing, 4 x 4 gauze, abd, Kerlix, and Coban and ACE.   The patient tolerated the procedure and anesthesia well.  Upon transfer to the recovery room, the patient?s vital signs were stable, and neurovascular status was intact.   ? ?Patient to be transferred back to medical floor to continue on IV antibiotics. Will vac will be ordered to be placed on patient bedside by wound care nursing on Monday.  ? ?Podiatry to follow ? ?[name], DPM ?  ?  ?  ?

## 2022-02-08 NOTE — Brief Op Note (Signed)
02/08/2022 ? ?9:13 AM ? ?PATIENT:  [name] Cruel Sr.  61 y.o. male ? ?PRE-OPERATIVE DIAGNOSIS:  Osteomyelitis ? ?POST-OPERATIVE DIAGNOSIS:  Osteomyelitis ? ?PROCEDURE:  Procedure(s) with comments: ?IRRIGATION AND DEBRIDEMENT WOUND (Right) - with removal of infected bone  ? ?SURGEON:  Surgeon(s) and Role: ?   Cannon Kettle, Lady Saucier, DPM - Primary ? ?PHYSICIAN ASSISTANT:  ? ?ASSISTANTS: none  ? ?ANESTHESIA:   MAC ? ?EBL:  500 mL  ? ?BLOOD ADMINISTERED:none ? ?DRAINS: none  ? ?LOCAL MEDICATIONS USED:  MARCAINE    and BUPIVICAINE  ? ?SPECIMEN:  Source of Specimen:  bone right foot ? ?DISPOSITION OF SPECIMEN:  PATHOLOGY ? ?COUNTS:  YES ? ?TOURNIQUET:  * No tourniquets in log * ? ?DICTATION: .Note written in EPIC ? ?PLAN OF CARE:  Return to medical floor ? ?PATIENT DISPOSITION:  PACU - hemodynamically stable. ?  ?Delay start of Pharmacological VTE agent (>24hrs) due to surgical blood loss or risk of bleeding: no ? ?

## 2022-02-08 NOTE — Transfer of Care (Signed)
Immediate Anesthesia Transfer of Care Note ? ?Patient: [name] [name]. ? ?Procedure(s) Performed: IRRIGATION AND DEBRIDEMENT WOUND (Right: Foot) ? ?Patient Location: PACU ? ?Anesthesia Type:MAC ? ?Level of Consciousness: awake and alert  ? ?Airway & Oxygen Therapy: Patient Spontanous Breathing and Patient connected to nasal cannula oxygen ? ?Post-op Assessment: Report given to RN and Post -op Vital signs reviewed and unstable, Anesthesiologist notified ? ?Post vital signs: Reviewed and stable - Pt given additional albumin and phenylephrine drip continued. ? ?Last Vitals:  ?Vitals Value Taken Time  ?BP 128/56 02/08/22 0922  ?Temp    ?Pulse 67 02/08/22 0925  ?Resp 14 02/08/22 0925  ?SpO2 98 % 02/08/22 0925  ?Vitals shown include unvalidated device data. ? ?Last Pain:  ?Vitals:  ? 02/08/22 0719  ?TempSrc:   ?PainSc: 0-No pain  ?   ? ?Patients Stated Pain Goal: 0 (02/06/22 1700) ? ?Complications: No notable events documented. ?

## 2022-02-08 NOTE — Progress Notes (Addendum)
?PROGRESS NOTE ? ? ? ?LEVEN HOEL Sr.  ZDG:[id number] DOB: [birth year] DOA: 02/02/2022 ?PCP: [facility]  ?Chief Complaint  ?Patient presents with  ? Altered Mental Status  ? ? ?Brief Narrative:  ?61 y/o male history of DM, ESRD on HD, admitted to the hospital with fever, acute encephalopathy, sepsis and right foot infection.  He has Amberlyn Martinezgarcia history of prior right fifth metatarsal amputation performed many years ago in [place].  Plain films of his foot indicate swelling and subcutaneous gas without any obvious bony erosion. He initially presented to [facility] and subsequently transferred to [facility].  Podiatry consulted, he was taken emergently to the [facility] on 3/19.  Postoperatively he has had persistent lethargy and was taken to the ICU 3/19 evening.  He was placed on Narcan drip for presumed lethargy due to opioid use however he only got 5 mg of oxycodone.  Narcan drip was discontinued on 3/20 and he was transferred out of the ICU. ? ?See below for additional details ?   ? ? ?Assessment & Plan: ?  ?Principal Problem: ?  Osteomyelitis ([place]) ?Active Problems: ?  Severe sepsis ([place]) ?  Hypotension ?  Acute metabolic encephalopathy ?  Stroke [facility]) ?  ESRD (end stage renal disease) ([place]) ?  DM2 (diabetes mellitus, type 2) ([place]) ?  HTN (hypertension) ?  Chronic systolic CHF (congestive heart failure) ([place]) ?  Acute respiratory failure with hypoxia ([place]) ?  Protein-calorie malnutrition, mild ([place]) ?  GERD (gastroesophageal reflux disease) ?  Carotid stenosis ?  Hyponatremia ?  Hyperkalemia ?  High anion gap metabolic acidosis ?  Anemia of chronic renal failure ?  Necrotizing soft tissue infection ?  Cellulitis in diabetic foot ([place]) ?  Bacteremia ?  Right foot infection ? ? 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 ?Blood cultures from 3/19 with morganella morganii and clostridium species  ?MRI foot/ankle right with soft tissue ulceration of lateral midfoot just lateral to postsurgical remaining base of 5th metatarsal and base of 4th metatarsal, marrow edema within base of 5th greater than lateral base of 4th metatarsals concerning for acute osteomyelitis (see report) ?S/p I&D with removal of residual bone at 4-5 metatarsals on 3/25 ?follow surg path from 3/25 ?Appreciate ID assistance, continuing vanc, flagyl, and cefepime - low suspicion for endocarditis per ID, TTE without eivdence of valvular vegetations on TTE (per ID, "do not feel strongly regarding getting TEE from ID standpoint given low grade bacteremia with GNR/GPR with right foot being source, would defer need of TEE to neurology if helpful for stroke workup - neurology did not recommend from their standpoint).  Recommending 6 weeks of abx after last debridement intervention. ? ? ?Hypotension ?Post op, likely multifactorial, blood loss, meds ?He's been weaned off pressors after small bolus ?Will continue to trend Hb/hct ? ?Stroke [facility]) ?MRI brain 3/22 with numerous punctate acute infarctions throughout both cerebral hemispheres, primarily affecting the deep brain ?Echo with Ef 45-50%, global hypokinesis, RVSF mildly reduced ?CTA neck 3/20 without LVO or hemodynamically significant stenosis of intracranial arteries, did have 70% stenosis for R ICA due to mixed density atherosclerosis ?CT venogram normal ?LDL 46, A1c 12.2 ?Appreciate neurology recommendations - suspect likely 2/2 hypoperfusion with intermittent  hypotension.  Aspirin 81 mg daily.  No statin at this time, LDL at goal. ? ? ?Acute metabolic encephalopathy ?Related to stroke as well as infection and acute hospitalization ?Improved (mild confusion post op, but seems to be  improving) ?Follow EEG - mild diffuse encephalopathy ?Appreciate neurology, concern for cytotoxic lesion of corpus callosum on MRI, likely related to metabolic derangement -> hyperglycemia, uremia, sepsis, etc ?Delirium precautions, will treat underlying cause ? ? ?ESRD (end stage renal disease) ([place]) ?Per renal ?Being evaluated outpatient for possible transplant ?S/p L arm fistulogram with vascular 3/23 -> L central angioplasty innominate and subclavian vein, L peripheral angioplasty cephalic vein ? ?DM2 (diabetes mellitus, type 2) ([place]) ?a1c 12.2 ?Reduced basal insulin, continue SSI ?At home taking 30 units daily ? ? ?HTN (hypertension) ?Intermittent hypotension, concern that this may have resulted in watershed infarcts ?Will hold BP meds for now, not on anything at home ? ?Acute respiratory failure with hypoxia ([place]) ?Currently has been weaned to ra, continue to monitor ?CXR 3/20 without frank edema ? ?Chronic systolic CHF (congestive heart failure) ([place]) ?EF 45% per echo 05/2021 ?Echo 3/22 with EF 45-50% ?Volume per renal ? ?Carotid stenosis ?Outpatient vascular follow up ? ?GERD (gastroesophageal reflux disease) ?Continue Protonix ? ? ?DVT prophylaxis: heparin ?Code Status: full ?Family Communication: daughter 3/22 - called 3/24, no answer ?Disposition:  ? ?Status is: Inpatient ?Remains inpatient appropriate because: need for IV abx, continued w/u encephalopathy ?  ?Consultants:  ?Neurology ?Vascular ?ID ?Renal ?podiatry ? ?Procedures:  ?left brachiocephalic arteriovenous fistula cannulation under ultrasound guidance ?left arm fistulogram including central venogram ?left central angioplasty innominate and subclavian vein (10 mm Mustang, 12 mm Mustang, 12 mm drug coated Lutonix) ?left peripheral angioplasty cephalic vein (8 mm Mustang) ? ?Echo ?IMPRESSIONS  ? ? ? 1. Left ventricular ejection fraction, by estimation, is 45 to 50%. The  ?left ventricle has mildly decreased function. The left ventricle   ?demonstrates global hypokinesis. There is moderate left ventricular  ?hypertrophy. Left ventricular diastolic function  ? could not be evaluated.  ? 2. Right ventricular systolic function is mildly reduced. The right  ?ventricular size is normal.  ? 3. The mitral valve is degenerative. Mild mitral valve regurgitation. No  ?evidence of mitral stenosis. Moderate to severe mitral annular  ?calcification.  ? 4. The aortic valve is tricuspid. There is moderate calcification of the  ?aortic valve. There is moderate thickening of the aortic valve. Aortic  ?valve regurgitation is not visualized. Mild aortic valve stenosis. Aortic  ?valve area, by VTI measures 2.26  ?cm?Marland Kitchen Aortic valve mean gradient measures 8.0 mmHg. Aortic valve Vmax  ?measures 1.92 m/s.  ? ?Conclusion(s)/Recommendation(s): No evidence of valvular vegetations on  ?this transthoracic echocardiogram. Consider Leani Myron transesophageal  ?echocardiogram to exclude infective endocarditis if clinically indicated. ? ?Burnard Bunting ?Summary:  ?Right: Resting right ankle-brachial index is within normal range. No  ?evidence of significant right lower extremity arterial disease. The right  ?toe-brachial index is normal.  ? ?Left: Resting left ankle-brachial index indicates noncompressible left  ?lower extremity arteries. The left toe-brachial index is abnormal. ? ?Antimicrobials:  ?Anti-infectives (From admission, onward)  ? ? Start     Dose/Rate Route Frequency Ordered Stop  ? 02/03/22 2200  metroNIDAZOLE (FLAGYL) tablet 500 mg       ? 500 mg Oral Every 12 hours 02/03/22 1108 03/22/22 2359  ? 02/03/22 2000  ceFEPIme (MAXIPIME) 2 g in sodium chloride 0.9 % 100 mL IVPB       ? 2 g ?200  mL/hr over 30 Minutes Intravenous Every M-W-F (2000) 02/02/22 0459 03/22/22 2359  ? 02/03/22 1200  vancomycin (VANCOCIN) IVPB 1000 mg/200 mL premix       ? 1,000 mg ?200 mL/hr over 60 Minutes Intravenous Every M-W-F (Hemodialysis) 02/02/22 0459 03/22/22 2359  ? 02/03/22 1030  metroNIDAZOLE (FLAGYL) IVPB  500 mg  Status:  Discontinued       ? 500 mg ?100 mL/hr over 60 Minutes Intravenous Every 12 hours 02/03/22 0934 02/03/22 1108  ? 02/02/22 1000  metroNIDAZOLE (FLAGYL) tablet 500 mg  Status:  Discontinued       ? 500 mg Or

## 2022-02-08 NOTE — Progress Notes (Signed)
?[facility] ?Progress Note  ? ?Subjective: went to OR this am for another I&D of the R foot. No new c/o.  ? ?Objective ?Vitals:  ? 02/08/22 1330 02/08/22 1345 02/08/22 1351 02/08/22 1407  ?BP: 125/75 (!) 143/71 (!) 159/69 132/66  ?Pulse: 67 69 70 72  ?Resp: 12 16 16 16   ?Temp:    98.1 ?F (36.7 ?C)  ?TempSrc:    Oral  ?SpO2: 99% 100% 100% 100%  ?Weight:      ?Height:      ? ?Physical Exam ?General: Chronically ill appearing male in NAD ?Neuro: responds verbally and accurately ?Heart: S1,S2 No M/R/G ?Lungs: CTAB, no WOB ?Abdomen: Obese, NT, ND ?Extremities:no LE edema ACE wrap R foot.  ?Dialysis Access: L AVF with thinning area lower portion on AVF. +T/B. Suture intact post F'gram ?  ? ?Dialysis Orders:  MWF [facility] ?4h 57min  450/500    117.8kg   2/ 2.5Ca bath  Hep 6000  L AVF ? - on 3/19 > hep B Ag neg, and Hep B Ab's high/ protective ? - calcitriol 1.80mcg PO qHD-last 01/31/22 ?  ?Assessment/Plan: ?R foot osteomyelitis- on IV vanc/cefepime/Flagyl. + Morganella bacteremia 2/2 foot abscess. S/p R foot I&D on 3/19 by podiatry and another I&D done today 3/25. Will need 6 weeks ABX for attempted limb salvage.  ?Acute metabolic encephalopathy: Improving. Per primary.  ?CVA: Neurology consulted. MRI done 02/04/2022 Numerous bilat punctate acute infarctions. Per primary.  ?ESRD - on HD MWF. Next HD Monday. Marland Kitchen  ?Hypertension/volume  - Blood pressures acceptable; euvolemic on exam. UF as tolerated. Avoid hypotension during HD. Keep SBP >120.  ?Anemia of CKD - Hgb 11.2; No ESA/Fe needs at this time ?Secondary Hyperparathyroidism -  CCa okay. Continue VDRA with HD. Phos in range lower part.  ?HD access: L AVF w/ thinning, ulcerated area at the lower portion. Stick away from area. VVS consulted and went for fistulogram on 3/23 by VVS > they did L central venoplasty to innominate and subclavian veins, and peripheral venoplasty to the cephalic vein.  ?Nutrition - getting diabetic diet now w/o renal restrictions - K+  and phos are not high so can keep this for now.  ? ? ?[name] Splinter, MD ?02/08/2022, 4:08 PM ? ? ? ? ?Recent Labs  ?Lab 02/07/22 ?0642 02/08/22 ?0451 02/08/22 ?1142  ?HGB 11.2* 11.0* 9.2*  ?ALBUMIN 2.3* 2.1*  --   ?CALCIUM 9.0 8.4*  --   ?PHOS 2.7 2.6  --   ?CREATININE 11.45* 8.77*  --   ?K 4.1 4.0  --   ? ? ? ? ?Medications: ? ceFEPime (MAXIPIME) IV 2 g (02/07/22 2140)  ? sodium chloride    ? vancomycin Stopped (02/07/22 1640)  ? ? (feeding supplement) PROSource Plus  30 mL Oral BID BM  ? acetaminophen  1,000 mg Oral Q8H  ? vitamin C  500 mg Oral Daily  ? aspirin EC  81 mg Oral Daily  ? Chlorhexidine Gluconate Cloth  6 each Topical Q0600  ? famotidine  20 mg Oral Daily  ? feeding supplement (NEPRO CARB STEADY)  237 mL Oral BID BM  ? heparin  5,000 Units Subcutaneous Q8H  ? insulin aspart  0-20 Units Subcutaneous Q4H  ? insulin glargine-yfgn  20 Units Subcutaneous Daily  ? metroNIDAZOLE  500 mg Oral Q12H  ? multivitamin  1 tablet Oral QHS  ? pantoprazole  40 mg Oral Q1200  ? sucroferric oxyhydroxide  1,000 mg Oral TID WC  ? thiamine injection  100 mg Intravenous Daily  ? Or  ? thiamine  100 mg Oral Daily  ? zinc sulfate  220 mg Oral BID  ? ? ? ? ?  ? ?

## 2022-02-09 ENCOUNTER — Encounter (HOSPITAL_COMMUNITY): Payer: Self-pay | Admitting: Sports Medicine

## 2022-02-09 DIAGNOSIS — M869 Osteomyelitis, unspecified: Secondary | ICD-10-CM | POA: Diagnosis not present

## 2022-02-09 DIAGNOSIS — M7989 Other specified soft tissue disorders: Secondary | ICD-10-CM | POA: Diagnosis not present

## 2022-02-09 DIAGNOSIS — M86171 Other acute osteomyelitis, right ankle and foot: Secondary | ICD-10-CM | POA: Diagnosis not present

## 2022-02-09 DIAGNOSIS — R7881 Bacteremia: Secondary | ICD-10-CM | POA: Diagnosis not present

## 2022-02-09 LAB — COMPREHENSIVE METABOLIC PANEL
ALT: 12 U/L (ref 0–44)
AST: 25 U/L (ref 15–41)
Albumin: 2.2 g/dL — ABNORMAL LOW (ref 3.5–5.0)
Alkaline Phosphatase: 68 U/L (ref 38–126)
Anion gap: 14 (ref 5–15)
BUN: 49 mg/dL — ABNORMAL HIGH (ref 6–20)
CO2: 23 mmol/L (ref 22–32)
Calcium: 8.5 mg/dL — ABNORMAL LOW (ref 8.9–10.3)
Chloride: 97 mmol/L — ABNORMAL LOW (ref 98–111)
Creatinine, Ser: 11 mg/dL — ABNORMAL HIGH (ref 0.61–1.24)
GFR, Estimated: 5 mL/min — ABNORMAL LOW (ref >60)
Glucose, Bld: 115 mg/dL — ABNORMAL HIGH (ref 70–99)
Potassium: 4.3 mmol/L (ref 3.5–5.1)
Sodium: 134 mmol/L — ABNORMAL LOW (ref 135–145)
Total Bilirubin: 0.9 mg/dL (ref 0.3–1.2)
Total Protein: 6.2 g/dL — ABNORMAL LOW (ref 6.5–8.1)

## 2022-02-09 LAB — CBC WITH DIFFERENTIAL/PLATELET
Abs Immature Granulocytes: 0.45 10*3/uL — ABNORMAL HIGH (ref 0.00–0.07)
Basophils Absolute: 0.1 10*3/uL (ref 0.0–0.1)
Basophils Relative: 0 %
Eosinophils Absolute: 0.6 10*3/uL — ABNORMAL HIGH (ref 0.0–0.5)
Eosinophils Relative: 5 %
HCT: 25.6 % — ABNORMAL LOW (ref 39.0–52.0)
Hemoglobin: 8.4 g/dL — ABNORMAL LOW (ref 13.0–17.0)
Immature Granulocytes: 4 %
Lymphocytes Relative: 15 %
Lymphs Abs: 1.9 10*3/uL (ref 0.7–4.0)
MCH: 31 pg (ref 26.0–34.0)
MCHC: 32.8 g/dL (ref 30.0–36.0)
MCV: 94.5 fL (ref 80.0–100.0)
Monocytes Absolute: 1.5 10*3/uL — ABNORMAL HIGH (ref 0.1–1.0)
Monocytes Relative: 12 %
Neutro Abs: 8.1 10*3/uL — ABNORMAL HIGH (ref 1.7–7.7)
Neutrophils Relative %: 64 %
Platelets: 368 10*3/uL (ref 150–400)
RBC: 2.71 MIL/uL — ABNORMAL LOW (ref 4.22–5.81)
RDW: 17.2 % — ABNORMAL HIGH (ref 11.5–15.5)
WBC: 12.6 10*3/uL — ABNORMAL HIGH (ref 4.0–10.5)
nRBC: 0.2 % (ref 0.0–0.2)

## 2022-02-09 LAB — GLUCOSE, CAPILLARY
Glucose-Capillary: 112 mg/dL — ABNORMAL HIGH (ref 70–99)
Glucose-Capillary: 119 mg/dL — ABNORMAL HIGH (ref 70–99)
Glucose-Capillary: 227 mg/dL — ABNORMAL HIGH (ref 70–99)
Glucose-Capillary: 217 mg/dL — ABNORMAL HIGH (ref 70–99)
Glucose-Capillary: 167 mg/dL — ABNORMAL HIGH (ref 70–99)

## 2022-02-09 LAB — HEMOGLOBIN AND HEMATOCRIT, BLOOD
HCT: 26.3 % — ABNORMAL LOW (ref 39.0–52.0)
Hemoglobin: 8.6 g/dL — ABNORMAL LOW (ref 13.0–17.0)

## 2022-02-09 LAB — MAGNESIUM: Magnesium: 2.1 mg/dL (ref 1.7–2.4)

## 2022-02-09 LAB — PHOSPHORUS: Phosphorus: 4.3 mg/dL (ref 2.5–4.6)

## 2022-02-09 LAB — TYPE AND SCREEN
ABO/RH(D): A POS
Antibody Screen: NEGATIVE

## 2022-02-09 MED ORDER — MIDODRINE HCL 5 MG PO TABS
10.0000 mg | ORAL_TABLET | ORAL | Status: DC
  Administered 2022-02-10 – 2022-02-17 (×4): 10 mg via ORAL
  Filled 2022-02-09 (×5): qty 2

## 2022-02-09 MED ORDER — INSULIN ASPART 100 UNIT/ML IJ SOLN
0.0000 [IU] | Freq: Every day | INTRAMUSCULAR | Status: DC
  Administered 2022-02-10: 2 [IU] via SUBCUTANEOUS
  Administered 2022-02-11: 1 [IU] via SUBCUTANEOUS
  Administered 2022-02-12 – 2022-02-15 (×2): 2 [IU] via SUBCUTANEOUS

## 2022-02-09 MED ORDER — INSULIN ASPART 100 UNIT/ML IJ SOLN
0.0000 [IU] | Freq: Three times a day (TID) | INTRAMUSCULAR | Status: DC
  Administered 2022-02-09 (×2): 5 [IU] via SUBCUTANEOUS
  Administered 2022-02-10: 3 [IU] via SUBCUTANEOUS
  Administered 2022-02-10: 5 [IU] via SUBCUTANEOUS
  Administered 2022-02-11: 2 [IU] via SUBCUTANEOUS
  Administered 2022-02-11 (×2): 3 [IU] via SUBCUTANEOUS
  Administered 2022-02-13: 2 [IU] via SUBCUTANEOUS
  Administered 2022-02-13: 3 [IU] via SUBCUTANEOUS
  Administered 2022-02-13: 5 [IU] via SUBCUTANEOUS
  Administered 2022-02-14 – 2022-02-15 (×4): 3 [IU] via SUBCUTANEOUS
  Administered 2022-02-15 (×2): 5 [IU] via SUBCUTANEOUS
  Administered 2022-02-16: 3 [IU] via SUBCUTANEOUS
  Administered 2022-02-16 – 2022-02-17 (×3): 5 [IU] via SUBCUTANEOUS
  Administered 2022-02-17: 2 [IU] via SUBCUTANEOUS
  Administered 2022-02-18: 5 [IU] via SUBCUTANEOUS
  Administered 2022-02-18: 2 [IU] via SUBCUTANEOUS
  Administered 2022-02-18: 5 [IU] via SUBCUTANEOUS
  Administered 2022-02-19: 2 [IU] via SUBCUTANEOUS
  Administered 2022-02-20: 5 [IU] via SUBCUTANEOUS
  Administered 2022-02-20: 3 [IU] via SUBCUTANEOUS

## 2022-02-09 NOTE — Progress Notes (Signed)
Subjective: [name] is a 61 y.o. male patient seen at bedside, resting comfortably in no acute distress s/p day #1 Right foot irrigation and debridement of wound with resection of bone at 4-5 metatarsal bases. Patient is noted to be sleeping but he does answer questions when asked and denies any pain in his foot.  No other issues noted.  ? ?Patient Active Problem List  ? Diagnosis Date Noted  ? Hypotension 02/08/2022  ? Right foot infection   ? Stroke [facility]) 02/05/2022  ? Carotid stenosis 02/05/2022  ? Bacteremia   ? Hyponatremia 02/03/2022  ? Hyperkalemia 02/03/2022  ? High anion gap metabolic acidosis 19/14/7829  ? Anemia of chronic renal failure 02/03/2022  ? Necrotizing soft tissue infection   ? Cellulitis in diabetic foot ([place])   ? Osteomyelitis ([place]) 02/02/2022  ? Acute metabolic encephalopathy 56/21/3086  ? Severe sepsis ([place]) 02/02/2022  ? Acute respiratory failure with hypoxia ([place]) 02/02/2022  ? Protein-calorie malnutrition, mild ([place]) 02/02/2022  ? GERD (gastroesophageal reflux disease) 02/02/2022  ? Chronic systolic CHF (congestive heart failure) ([place]) 08/14/2019  ? LBBB (left bundle branch block) 08/13/2019  ? Problem with vascular access 08/13/2019  ? Dialysis AV fistula malfunction ([place]) 08/12/2019  ? ESRD (end stage renal disease) ([place]) 09/20/2018  ? Fever 09/20/2018  ? Nausea & vomiting 09/20/2018  ? DM2 (diabetes mellitus, type 2) ([place]) 09/20/2018  ? HTN (hypertension) 09/20/2018  ? ? ? ?Current Facility-Administered Medications:  ?  (feeding supplement) PROSource Plus liquid 30 mL, 30 mL, Oral, BID BM, [name], MD, 30 mL at 02/07/22 0944 ?  acetaminophen (TYLENOL) tablet 1,000 mg, 1,000 mg, Oral, Q8H, 1,000 mg at 02/09/22 0500 **FOLLOWED BY** [START ON 02/11/2022] acetaminophen (TYLENOL) tablet 650 mg, 650 mg, Oral, Q6H PRN, Elodia Florence., MD ?  albuterol (PROVENTIL) (2.5 MG/3ML) 0.083% nebulizer solution 2.5 mg, 2.5 mg, Nebulization, Q2H PRN, [name],  MD ?  ascorbic acid (VITAMIN C) tablet 500 mg, 500 mg, Oral, Daily, [name], MD, 500 mg at 02/07/22 0941 ?  aspirin EC tablet 81 mg, 81 mg, Oral, Daily, [name], MD, 81 mg at 02/07/22 0941 ?  ceFEPIme (MAXIPIME) 2 g in sodium chloride 0.9 % 100 mL IVPB, 2 g, Intravenous, Q M,W,F-2000, Manandhar, Sabina, MD, Last Rate: 200 mL/hr at 02/07/22 2140, 2 g at 02/07/22 2140 ?  Chlorhexidine Gluconate Cloth 2 % PADS 6 each, 6 each, Topical, Q0600, [name], MD, 6 each at 02/09/22 0510 ?  docusate sodium (COLACE) capsule 100 mg, 100 mg, Oral, BID PRN, [name], MD, 100 mg at 02/06/22 2139 ?  famotidine (PEPCID) tablet 20 mg, 20 mg, Oral, Daily, [name], MD, 20 mg at 02/08/22 2225 ?  feeding supplement (NEPRO CARB STEADY) liquid 237 mL, 237 mL, Oral, BID BM, [name], MD, 237 mL at 02/07/22 5784 ?  fentaNYL (SUBLIMAZE) injection 12.5 mcg, 12.5 mcg, Intravenous, Q2H PRN, Elodia Florence., MD, 12.5 mcg at 02/08/22 2025 ?  heparin injection 5,000 Units, 5,000 Units, Subcutaneous, Q8H, [name], MD, 5,000 Units at 02/09/22 0500 ?  insulin aspart (novoLOG) injection 0-15 Units, 0-15 Units, Subcutaneous, TID WC, Fl[place]ne Glen, A Clint Lipps., MD ?  insulin aspart (novoLOG) injection 0-5 Units, 0-5 Units, Subcutaneous, QHS, Florene Glen, A Clint Lipps., MD ?  insulin glargine-yfgn [facility]) injection 20 Units, 20 Units, Subcutaneous, Daily, [name], MD, 20 Units at 02/08/22 1554 ?  metroNIDAZOLE (FLAGYL) tablet 500 mg,  500 mg, Oral, Q12H, Manandhar, Sabina, MD, 500 mg at 02/08/22 2225 ?  multivitamin (RENA-VIT) tablet 1 tablet, 1 tablet, Oral, QHS, [name], MD, 1 tablet at 02/08/22 2225 ?  ondansetron (ZOFRAN) tablet 4 mg, 4 mg, Oral, Q6H PRN **OR** ondansetron (ZOFRAN) injection 4 mg, 4 mg, Intravenous, Q6H PRN, [name], MD ?  oxyCODONE (Oxy IR/ROXICODONE) immediate release tablet 5 mg, 5 mg, Oral, Q6H PRN, Elodia Florence., MD, 5 mg at 02/08/22 1616 ?  pantoprazole (PROTONIX) EC tablet 40 mg, 40 mg, Oral, Q1200, [name], MD, 40 mg at 02/06/22 0957 ?  polyethylene glycol (MIRALAX / GLYCOLAX) packet 17 g, 17 g, Oral, Daily PRN, [name], MD, 17 g at 02/06/22 2139 ?  sodium chloride 0.9 % bolus 250 mL, 250 mL, Intravenous, Once PRN, [name], MD ?  sucroferric oxyhydroxide [facility]) chewable tablet 1,000 mg, 1,000 mg, Oral, TID WC, [name], MD, 1,000 mg at 02/09/22 8338 ?  thiamine (B-1) injection 100 mg, 100 mg, Intravenous, Daily, 100 mg at 02/07/22 2505 **OR** thiamine tablet 100 mg, 100 mg, Oral, Daily, [name], MD, 100 mg at 02/06/22 3976 ?  vancomycin (VANCOCIN) IVPB 1000 mg/200 mL premix, 1,000 mg, Intravenous, Q M,W,F-HD, [name], MD, Stopped at 02/07/22 1640 ?  zinc sulfate capsule 220 mg, 220 mg, Oral, BID, [name], MD, 220 mg at 02/08/22 2225 ? ?Allergies  ?Allergen Reactions  ? Sulfa Antibiotics Nausea And Vomiting  ? ? ? ?Objective: ?Today's Vitals  ? 02/09/22 0333 02/09/22 0400 02/09/22 0500 02/09/22 0744  ?BP: (!) 102/49 (!) 114/57  (!) 110/51  ?Pulse: 69 69  72  ?Resp: 18 20    ?Temp: 97.8 ?F (36.6 ?C)   98 ?F (36.7 ?C)  ?TempSrc:      ?SpO2: 98% 100%  96%  ?Weight:   121 kg   ?Height:      ?PainSc:      ? ? ?General: No acute distress ? ?Right Lower extremity: Dressing to Right foot clean, dry, intact. No strikethrough noted, Upon removal of dressings there is bloody drainage in inner guaze, wound bed once packing removed is granular with bone and tendon exposed, measures 10x8x1cm, margins appear viable with maceration at distal aspect of wound adjacent to 4th toe, Decreased erythema, decreased edema, no malodor. No other acute signs of infection. No calf pain noted. Range of motion excluding surgical site within normal limits with no pain or crepitation.   ? ? ? ? ? ?Post Op Xray, Right foot:IMPRESSION: ?1. Questionable  cortical resorption along the proximal fourth ?metatarsal suspicious for osteomyelitis. ?2. No other evidence suggesting osteomyelitis. ?3. Previous resection of the fifth metatarsal and fifth toe. ?  ? ?Assessment and Plan:  ?Problem List Items Addressed This Visit   ? ?  ? Genitourinary  ? ESRD (end stage renal disease) ([place]) - Primary  ?  Per renal ?Being evaluated outpatient for possible transplant ?S/p L arm fistulogram with vascular 3/23 -> L central angioplasty innominate and subclavian vein, L peripheral angioplasty cephalic vein ?  ?  ?  ? Other  ? Right foot infection  ? Relevant Medications  ? vancomycin (VANCOCIN) IVPB 1000 mg/200 mL premix  ? ceFEPIme (MAXIPIME) 2 g in sodium chloride 0.9 % 100 mL IVPB  ? metroNIDAZOLE (FLAGYL) tablet 500 mg  ? ?Other Visit Diagnoses   ? ? Delirium      ? ?  ? ? ?-Patient seen  and evaluated at bedside ?-Chart reviewed ?-Xrays reviewed likely there is some distortion from post op packing dressing in wound bed and post surgical changes ?-Dressing change preformed; applied xeroform and dry dressing to right foot ?-Recommend to keep dressing clean, dry, and intact to Right foot allowing nursing to change dressings as ordered if there is strike through; Recommend on tomorrow wound care nurse to place wound vac at bedside to the right foot as ordered ?-Awaiting intra-op cultures; continue with IV antibiotics and recommend Infectious Disease Consult and long term antibiotics since patient is high risk for limb loss due to this extensive wound ?-Weightbearing for transfers only with surgical shoe to right foot ?-Continue with rest and elevation to assist with pain and edema control  ?-Podiatry will continue to follow closely  ?-Anticipated discharge plan of care: To SNF for antibiotics, wound care wound vac changes on right foot m/w/f, and PT when deemed medically stable for discharge. Patient will follow up in office for post op wound care. Office will contact patient for  follow up appointment after discharge.  ? ?[name], DPM ?[facility] ?[phone] office ?[phone] cell ?Available via secure chat  ?

## 2022-02-09 NOTE — Progress Notes (Signed)
Orthopedic Tech Progress Note ?Patient Details:  ?[name] KALAFUT Sr. ?[birth year] ?[id number] ? ?Ortho Devices ?Type of Ortho Device: Postop shoe/boot ?Ortho Device/Splint Location: rle ?Ortho Device/Splint Interventions: Ordered ?  ? Dropped post op shoe off in pt rm. ? ?Edwina Barth ?02/09/2022, 2:53 PM ? ?

## 2022-02-09 NOTE — Progress Notes (Addendum)
?[facility] ?Progress Note  ? ?Subjective: seen in room, no new c/o ? ?Objective ?Vitals:  ? 02/09/22 0333 02/09/22 0400 02/09/22 0500 02/09/22 0744  ?BP: (!) 102/49 (!) 114/57  (!) 110/51  ?Pulse: 69 69  72  ?Resp: 18 20    ?Temp: 97.8 ?F (36.6 ?C)   98 ?F (36.7 ?C)  ?TempSrc:      ?SpO2: 98% 100%  96%  ?Weight:   121 kg   ?Height:      ? ?Physical Exam ?General: Chronically ill appearing male in NAD ?Heart: S1,S2 No M/R/G ?Lungs: CTAB, no WOB ?Abdomen: Obese, NT, ND ?Extremities:no LE edema ACE wrap R foot.  ?Dialysis Access: L AVF with thinning area lower portion on AVF. +T/B. Suture intact post F'gram ?  ? ?Dialysis Orders:  MWF [facility] ?4h 6min  450/500    117.8kg   2/ 2.5Ca bath  Hep 6000  L AVF ? - on 3/19 > hep B Ag neg, and Hep B Ab's high/ protective ? - calcitriol 1.46mcg PO qHD-last 01/31/22 ?  ?Assessment/Plan: ?R foot osteomyelitis- on IV vanc/cefepime/Flagyl. + Morganella bacteremia 2/2 foot abscess. S/p R foot I&D on 3/19 by podiatry and another R foot I&D done 3/25. Will need 6 weeks ABX for attempted limb salvage.  ?Acute CVA: Neurology consulted. MRI done 02/04/2022 Numerous bilat punctate acute infarctions. Per neuro likely related to hypoperfusion w/ intermittent hypotension.  Per primary.  ?Acute metabolic encephalopathy: Improved. Per primary.  ?ESRD - on HD MWF. Next HD Monday. ?Hypertension/volume  - Blood pressures normal to soft; euvolemic on exam. UF as tolerated. Avoid hypotension during HD. Will start midodrine 10 pre HD tiw.  ?Anemia of CKD - Hgb 11 >> 8-9 range. Consider ESA/Fe this week ?Secondary Hyperparathyroidism -  CCa okay. Continue VDRA with HD. Phos in range lower part.  ?HD access: L AVF w/ thinning, ulcerated area at the lower portion. Stick away from area. VVS consulted and went for fistulogram on 3/23 by VVS > they did L central venoplasty to innominate and subclavian veins, and peripheral venoplasty to the cephalic vein.  ?Nutrition - getting diabetic diet  w/o renal restrictions, watch K+ and phos  ? ? ?[name] Splinter, MD ?02/09/2022, 11:39 AM ? ? ? ? ?Recent Labs  ?Lab 02/08/22 ?0451 02/08/22 ?1142 02/09/22 ?0751  ?HGB 11.0* 9.2* 8.4*  ?ALBUMIN 2.1*  --  2.2*  ?CALCIUM 8.4*  --  8.5*  ?PHOS 2.6  --  4.3  ?CREATININE 8.77*  --  11.00*  ?K 4.0  --  4.3  ? ? ? ? ?Medications: ? ceFEPime (MAXIPIME) IV 2 g (02/07/22 2140)  ? sodium chloride    ? vancomycin Stopped (02/07/22 1640)  ? ? (feeding supplement) PROSource Plus  30 mL Oral BID BM  ? acetaminophen  1,000 mg Oral Q8H  ? vitamin C  500 mg Oral Daily  ? aspirin EC  81 mg Oral Daily  ? Chlorhexidine Gluconate Cloth  6 each Topical Q0600  ? famotidine  20 mg Oral Daily  ? feeding supplement (NEPRO CARB STEADY)  237 mL Oral BID BM  ? heparin  5,000 Units Subcutaneous Q8H  ? insulin aspart  0-15 Units Subcutaneous TID WC  ? insulin aspart  0-5 Units Subcutaneous QHS  ? insulin glargine-yfgn  20 Units Subcutaneous Daily  ? metroNIDAZOLE  500 mg Oral Q12H  ? multivitamin  1 tablet Oral QHS  ? pantoprazole  40 mg Oral Q1200  ? sucroferric oxyhydroxide  1,000 mg  Oral TID WC  ? thiamine injection  100 mg Intravenous Daily  ? Or  ? thiamine  100 mg Oral Daily  ? zinc sulfate  220 mg Oral BID  ? ? ? ? ?  ? ?

## 2022-02-09 NOTE — Progress Notes (Addendum)
?PROGRESS NOTE ? ? ? ?[name] [name].  SKA:[id number] DOB: 14-Aug-1961 DOA: 02/02/2022 ?PCP: [facility]  ?Chief Complaint  ?Patient presents with  ? Altered Mental Status  ? ? ?Brief Narrative:  ?61 y/o male history of DM, ESRD on HD, admitted to the hospital with fever, acute encephalopathy, sepsis and right foot infection.  He has Meagan Spease history of prior right fifth metatarsal amputation performed many years ago in [place].  Plain films of his foot indicate swelling and subcutaneous gas without any obvious bony erosion. [facility] presented to [facility] and subsequently transferred to [facility].  Podiatry consulted, he was taken emergently to the [facility] on 3/19.  Postoperatively he has had persistent lethargy and was taken to the ICU 3/19 evening.  He was placed on Narcan drip for presumed lethargy due to opioid use however he only got 5 mg of oxycodone.  Narcan drip was discontinued on 3/20 and he was transferred out of the ICU. ? ?See below for additional details ?   ? ? ?Assessment & Plan: ?  ?Principal Problem: ?  Osteomyelitis ([place]) ?Active Problems: ?  Severe sepsis ([place]) ?  Hypotension ?  Acute metabolic encephalopathy ?  Stroke [facility]) ?  ESRD (end stage renal disease) ([place]) ?  DM2 (diabetes mellitus, type 2) ([place]) ?  HTN (hypertension) ?  Chronic systolic CHF (congestive heart failure) ([place]) ?  Acute respiratory failure with hypoxia ([place]) ?  Protein-calorie malnutrition, mild ([place]) ?  GERD (gastroesophageal reflux disease) ?  Carotid stenosis ?  Hyponatremia ?  Hyperkalemia ?  High anion gap metabolic acidosis ?  Anemia of chronic renal failure ?  Necrotizing soft tissue infection ?  Cellulitis in diabetic foot ([place]) ?  Bacteremia ?  Right foot infection ? ? 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 ?Blood cultures from 3/19 with morganella morganii and clostridium species  ?MRI foot/ankle right with soft tissue ulceration of lateral midfoot just lateral to postsurgical remaining base of 5th metatarsal and base of 4th metatarsal, marrow edema within base of 5th greater than lateral base of 4th metatarsals concerning for acute osteomyelitis (see report) ?S/p I&D with removal of residual bone at 4-5 metatarsals on 3/25 ?Wound care nurse for wound vac 3/27 ?follow surg path from 3/25 ?Appreciate ID assistance, continuing vanc, flagyl, and cefepime - low suspicion for endocarditis per ID, TTE without eivdence of valvular vegetations on TTE (per ID, "do not feel strongly regarding getting TEE from ID standpoint given low grade bacteremia with GNR/GPR with right foot being source, would defer need of TEE to neurology if helpful for stroke workup - neurology did not recommend from their standpoint).  Recommending 6 weeks of abx after last debridement intervention (from 3/25). ? ? ?Hypotension ?Post op, likely multifactorial, blood loss, meds ?He was weaned off pressors after small bolus post op 3/25 ?Will continue to trend Hb/hct ?Planning for midodrine 10 mg pre HD ? ?Stroke [facility]) ?MRI brain 3/22 with numerous punctate acute infarctions throughout both cerebral hemispheres, primarily affecting the deep brain ?Echo with Ef 45-50%, global hypokinesis, RVSF mildly reduced ?CTA neck 3/20 without LVO or hemodynamically significant stenosis of intracranial arteries, did have 70% stenosis for R ICA due to mixed  density atherosclerosis ?CT venogram normal ?LDL 46, A1c 12.2 ?Appreciate neurology recommendations - suspect likely 2/2 hypoperfusion with intermittent hypotension.  Aspirin 81 mg daily.  No statin at this time, LDL at goal. ? ? ?Acute metabolic encephalopathy ?Related to stroke as well as  infection and acute hospitalization ?Improving ?Follow EEG - mild diffuse encephalopathy ?Appreciate neurology, concern for cytotoxic lesion of corpus callosum on MRI, likely related to metabolic derangement -> hyperglycemia, uremia, sepsis, etc ?Delirium precautions, will treat underlying cause ? ? ?ESRD (end stage renal disease) ([place]) ?Per renal ?Being evaluated outpatient for possible transplant ?S/p L arm fistulogram with vascular 3/23 -> L central angioplasty innominate and subclavian vein, L peripheral angioplasty cephalic vein ? ?DM2 (diabetes mellitus, type 2) ([place]) ?a1c 12.2 ?Reduced basal insulin, continue SSI ?At home taking 30 units daily ? ? ?HTN (hypertension) ?Intermittent hypotension, concern that this may have resulted in watershed infarcts ?Will hold BP meds for now, not on anything at home ? ?Acute respiratory failure with hypoxia ([place]) ?Currently has been weaned to ra, continue to monitor ?CXR 3/20 without frank edema ? ?Chronic systolic CHF (congestive heart failure) ([place]) ?EF 45% per echo 05/2021 ?Echo 3/22 with EF 45-50% ?Volume per renal ? ?Carotid stenosis ?Outpatient vascular follow up ? ?GERD (gastroesophageal reflux disease) ?Continue Protonix ? ? ?DVT prophylaxis: heparin ?Code Status: full ?Family Communication: daughter 3/22 - called 3/24, no answer ?Disposition:  ? ?Status is: Inpatient ?Remains inpatient appropriate because: need for IV abx, continued w/u encephalopathy ?  ?Consultants:  ?Neurology ?Vascular ?ID ?Renal ?podiatry ? ?Procedures:  ?3/25 ?PROCEDURE PERFORMED: Right irrigation and debridement with removal of residual bone at 4-5 metatarsals ? ?3/19 ?Procedure:  ?1. Right foot incision and drainage and revision of fifth ray amputation  ? ?3/23 ?left brachiocephalic arteriovenous fistula cannulation under ultrasound guidance ?left arm fistulogram including central venogram ?left central angioplasty innominate and subclavian vein (10 mm Mustang, 12 mm Mustang, 12 mm drug  coated Lutonix) ?left peripheral angioplasty cephalic vein (8 mm Mustang) ? ?Echo ?IMPRESSIONS  ? ? ? 1. Left ventricular ejection fraction, by estimation, is 45 to 50%. The  ?left ventricle has mildly decreased function. The left ventricle  ?demonstrates global hypokinesis. There is moderate left ventricular  ?hypertrophy. Left ventricular diastolic function  ? could not be evaluated.  ? 2. Right ventricular systolic function is mildly reduced. The right  ?ventricular size is normal.  ? 3. The mitral valve is degenerative. Mild mitral valve regurgitation. No  ?evidence of mitral stenosis. Moderate to severe mitral annular  ?calcification.  ? 4. The aortic valve is tricuspid. There is moderate calcification of the  ?aortic valve. There is moderate thickening of the aortic valve. Aortic  ?valve regurgitation is not visualized. Mild aortic valve stenosis. Aortic  ?valve area, by VTI measures 2.26  ?cm?Marland Kitchen Aortic valve mean gradient measures 8.0 mmHg. Aortic valve Vmax  ?measures 1.92 m/s.  ? ?Conclusion(s)/Recommendation(s): No evidence of valvular vegetations on  ?this transthoracic echocardiogram. Consider Kellyanne Ellwanger transesophageal  ?echocardiogram to exclude infective endocarditis if clinically indicated. ? ?Burnard Bunting ?Summary:  ?Right: Resting right ankle-brachial index is within normal range. No  ?evidence of significant right lower extremity arterial disease. The right  ?toe-brachial index is normal.  ? ?Left: Resting left ankle-brachial index indicates noncompressible left  ?lower extremity arteries. The left toe-brachial index is abnormal. ? ?Antimicrobials:  ?Anti-infectives (From admission, onward)  ? ? Start     Dose/Rate Route Frequency Ordered Stop  ? 02/03/22 2200  metroNIDAZOLE (FLAGYL) tablet 500 mg       ?  500 mg Oral Every 12 hours 02/03/22 1108 03/22/22 2359  ? 02/03/22 2000  ceFEPIme (MAXIPIME) 2 g in sodium chloride 0.9 % 100 mL IVPB       ? 2 g ?200 mL/hr over 30 Minutes Intravenous Every M-W-F (2000) 02/02/22  0459 03/22/22 2359  ? 02/03/22 1200  vancomycin (VANCOCIN) IVPB 1000 mg/200 mL premix       ? 1,000 mg ?200 mL/hr over 60 Minutes Intravenous Every M-W-F (Hemodialysis) 02/02/22 0459 03/22/22 2359  ? 03/20/

## 2022-02-09 NOTE — Progress Notes (Addendum)
? ?[facility] Infectious Diseases Follow Up Note ? ?Patient Identification: ?Patient Name: [name] GENET Sr. MRN: [id number] Admit Date: 02/02/2022  1:01 AM ?Age: 61 y.o.Today's Date: 02/09/2022 ? ?Reason for Visit: osteomyelitis  ? ?Principal Problem: ?  Osteomyelitis ([place]) ?Active Problems: ?  ESRD (end stage renal disease) ([place]) ?  DM2 (diabetes mellitus, type 2) ([place]) ?  HTN (hypertension) ?  Chronic systolic CHF (congestive heart failure) ([place]) ?  Acute metabolic encephalopathy ?  Severe sepsis ([place]) ?  Acute respiratory failure with hypoxia ([place]) ?  Protein-calorie malnutrition, mild ([place]) ?  GERD (gastroesophageal reflux disease) ?  Hyponatremia ?  Hyperkalemia ?  High anion gap metabolic acidosis ?  Anemia of chronic renal failure ?  Necrotizing soft tissue infection ?  Cellulitis in diabetic foot ([place]) ?  Bacteremia ?  Stroke [facility]) ?  Carotid stenosis ?  Right foot infection ?  Hypotension ? ? ?Antibiotics:  ?Vancomycin 3/18-c ?Cefepime 3/18-c ?Metronidazole 3/18-c ? ?Lines/Hardwares: left AVF ? ?Interval Events: continues to be afebrile, encephalopathy improved  ? ? ?Assessment ?RT DFU/osteomyelitis  ?- 3/19 Right foot incision and drainage and revision of fifth ray amputation. OR cx with E faecalis, Morganella morganii, bacteroides thetiataomicron ? ?2. Low grade Morganella?clostridium  bacteremia 2/2 above ? ?3. Encephalopathy: in the setting of above +  hypoperfusion with intermittent hypotension ?MRI brain 3/22 with numerous punctate acute infarctions throughout both cerebral hemispheres, primarily affecting the deep brain ? ?4. ESRD on HD via left arm AVF  ?S/p left central angioplasty innominate and subclavian vein and left peripheral angioplasty cephalic vein on 7/61 by [facility]VS ? ?5. DM ? ?Recommendations ?Continue vancomycin and cefepime with HD and PO metronidazole for 6 weeks from 3/25.  ?Surgical path is pending ?Monitor CBC, BMP and vancomycin  trough ?BG control  ?Will follow in the clinic. Otherwise ID will sign off.  ? ?Rest of the management as per the primary team. ?Thank you for the consult. Please page with pertinent questions or concerns. ? ?______________________________________________________________________ ?Subjective ?patient seen and examined at the bedside.  ?Denies any complaints ? ?Vitals ?BP (!) 119/56 (BP Location: Right Arm)   Pulse 74   Temp 98 ?F (36.7 ?C)   Resp 18   Ht 6\' 1"  (1.854 m)   Wt 121 kg   SpO2 98%   BMI 35.19 kg/m?  ? ?  ?Physical Exam ?Constitutional:  sitting up in bed and appears comfortable  ?   Comments:  ? ?Cardiovascular:  ?   Rate and Rhythm: Normal rate and regular rhythm.  ?   Heart sounds:  ? ?Pulmonary:  ?   Effort: Pulmonary effort is normal on room air  ?   Comments:  ? ?Abdominal:  ?   Palpations: Abdomen is soft.  ?   Tenderness: non distended  ? ?Musculoskeletal:     ?   General: Rt foot is bandaged ? ? ?Skin: ?   Comments: left UE AVF OK ? ?Neurological:  ?   General: awake, alert and oriented to name, place, person and year/month. Following commands appropriately  ? ?Pertinent Microbiology ?Results for orders placed or performed during the hospital encounter of 02/02/22  ?Blood Culture (routine x 2)     Status: Abnormal  ? Collection Time: 02/02/22  1:50 AM  ? Specimen: BLOOD RIGHT ARM  ?Result Value Ref Range Status  ? Specimen Description   Final  ?  BLOOD RIGHT ARM ?Performed at [facility], [address] [place], [place] [address] ?  ?  Special Requests   Final  ?  BOTTLES DRAWN AEROBIC AND ANAEROBIC Blood Culture adequate volume ?Performed at [facility], [address], [place], [place] [address] ?  ? Culture  Setup Time   Final  ?  ANAEROBIC BOTTLE ONLY ?Gram Stain Report Called to,Read Back By and Verified With: RACHEL PUCKETT @ [phone] ON 02/04/22 C VARNER ?[place] ?CRITICAL RESULT CALLED TO, READ BACK BY AND VERIFIED WITH: PHARMD E.UELAND AT 4782 ON 02/04/2022 BY T.SAAD. ?GRAM  POSITIVE RODS ?CORRECTED RESULTS ?PREVIOUSLY REPORTED AS: GRAM POSITIVE COCCI IN DIPLOS ?CORRECTED RESULTS CALLED TO: PHARMD A UELAND [id number] AT 0865 BY CM ?  ? Culture (A)  Final  ?  MORGANELLA MORGANII ?CLOSTRIDIUM SPECIES ?Standardized susceptibility testing for this organism is not available. ?Performed at [facility], [place] [address], [place], [place] [address] ?  ? Report Status 02/07/2022 FINAL  Final  ? Organism ID, Bacteria MORGANELLA MORGANII  Final  ?    Susceptibility  ? Morganella morganii - MIC*  ?  AMPICILLIN >=32 RESISTANT Resistant   ?  CEFAZOLIN >=64 RESISTANT Resistant   ?  CEFTAZIDIME <=1 SENSITIVE Sensitive   ?  CIPROFLOXACIN <=0.25 SENSITIVE Sensitive   ?  GENTAMICIN <=1 SENSITIVE Sensitive   ?  IMIPENEM 4 SENSITIVE Sensitive   ?  TRIMETH/SULFA <=20 SENSITIVE Sensitive   ?  AMPICILLIN/SULBACTAM 16 INTERMEDIATE Intermediate   ?  PIP/TAZO <=4 SENSITIVE Sensitive   ?  * MORGANELLA MORGANII  ?Blood Culture ID Panel (Reflexed)     Status: Abnormal  ? Collection Time: 02/02/22  1:50 AM  ?Result Value Ref Range Status  ? Enterococcus faecalis NOT DETECTED NOT DETECTED Final  ? Enterococcus Faecium NOT DETECTED NOT DETECTED Final  ? Listeria monocytogenes NOT DETECTED NOT DETECTED Final  ? Staphylococcus species NOT DETECTED NOT DETECTED Final  ? Staphylococcus aureus (BCID) NOT DETECTED NOT DETECTED Final  ? Staphylococcus epidermidis NOT DETECTED NOT DETECTED Final  ? Staphylococcus lugdunensis NOT DETECTED NOT DETECTED Final  ? Streptococcus species NOT DETECTED NOT DETECTED Final  ? Streptococcus agalactiae NOT DETECTED NOT DETECTED Final  ? Streptococcus pneumoniae NOT DETECTED NOT DETECTED Final  ? Streptococcus pyogenes NOT DETECTED NOT DETECTED Final  ? A.calcoaceticus-baumannii NOT DETECTED NOT DETECTED Final  ? Bacteroides fragilis NOT DETECTED NOT DETECTED Final  ? Enterobacterales DETECTED (A) NOT DETECTED Final  ?  Comment: Enterobacterales represent a large order of gram negative  bacteria, not a single organism. Refer to culture for further identification. ?CRITICAL RESULT CALLED TO, READ BACK BY AND VERIFIED WITH: ?PHARMD E.UELAND AT 6295 ON 02/04/2022 BY T.SAAD. ?  ? Enterobacter cloacae complex NOT DETECTED NOT DETECTED Final  ? Escherichia coli NOT DETECTED NOT DETECTED Final  ? Klebsiella aerogenes NOT DETECTED NOT DETECTED Final  ? Klebsiella oxytoca NOT DETECTED NOT DETECTED Final  ? Klebsiella pneumoniae NOT DETECTED NOT DETECTED Final  ? Proteus species NOT DETECTED NOT DETECTED Final  ? Salmonella species NOT DETECTED NOT DETECTED Final  ? Serratia marcescens NOT DETECTED NOT DETECTED Final  ? Haemophilus influenzae NOT DETECTED NOT DETECTED Final  ? Neisseria meningitidis NOT DETECTED NOT DETECTED Final  ? Pseudomonas aeruginosa NOT DETECTED NOT DETECTED Final  ? Stenotrophomonas maltophilia NOT DETECTED NOT DETECTED Final  ? Candida albicans NOT DETECTED NOT DETECTED Final  ? Candida auris NOT DETECTED NOT DETECTED Final  ? Candida glabrata NOT DETECTED NOT DETECTED Final  ? Candida krusei NOT DETECTED NOT DETECTED Final  ? Candida parapsilosis NOT DETECTED NOT DETECTED  Final  ? Candida tropicalis NOT DETECTED NOT DETECTED Final  ? Cryptococcus neoformans/gattii NOT DETECTED NOT DETECTED Final  ? CTX-M ESBL NOT DETECTED NOT DETECTED Final  ? Carbapenem resistance IMP NOT DETECTED NOT DETECTED Final  ? Carbapenem resistance KPC NOT DETECTED NOT DETECTED Final  ? Carbapenem resistance NDM NOT DETECTED NOT DETECTED Final  ? Carbapenem resist OXA 48 LIKE NOT DETECTED NOT DETECTED Final  ? Carbapenem resistance VIM NOT DETECTED NOT DETECTED Final  ?  Comment: Performed at [facility], [place] [address], [place], [place] [address]  ?Blood Culture (routine x 2)     Status: None  ? Collection Time: 02/02/22  1:52 AM  ? Specimen: BLOOD RIGHT HAND  ?Result Value Ref Range Status  ? Specimen Description BLOOD RIGHT HAND  Final  ? Special Requests   Final  ?  BOTTLES DRAWN AEROBIC ONLY  Blood Culture adequate volume  ? Culture   Final  ?  NO GROWTH 5 DAYS ?Performed at [facility], [address] [place], [place] [address] ?  ? Report Status 02/07/2022 FINAL  Final  ?Resp Panel by RT-PCR (Flu A&B, C

## 2022-02-10 DIAGNOSIS — M869 Osteomyelitis, unspecified: Secondary | ICD-10-CM | POA: Diagnosis not present

## 2022-02-10 LAB — CBC WITH DIFFERENTIAL/PLATELET
Abs Immature Granulocytes: 0.59 10*3/uL — ABNORMAL HIGH (ref 0.00–0.07)
Basophils Absolute: 0.1 10*3/uL (ref 0.0–0.1)
Basophils Relative: 0 %
Eosinophils Absolute: 0.5 10*3/uL (ref 0.0–0.5)
Eosinophils Relative: 3 %
HCT: 27.2 % — ABNORMAL LOW (ref 39.0–52.0)
Hemoglobin: 9.1 g/dL — ABNORMAL LOW (ref 13.0–17.0)
Immature Granulocytes: 4 %
Lymphocytes Relative: 13 %
Lymphs Abs: 2.1 10*3/uL (ref 0.7–4.0)
MCH: 31.3 pg (ref 26.0–34.0)
MCHC: 33.5 g/dL (ref 30.0–36.0)
MCV: 93.5 fL (ref 80.0–100.0)
Monocytes Absolute: 1.6 10*3/uL — ABNORMAL HIGH (ref 0.1–1.0)
Monocytes Relative: 10 %
Neutro Abs: 10.6 10*3/uL — ABNORMAL HIGH (ref 1.7–7.7)
Neutrophils Relative %: 70 %
Platelets: 436 10*3/uL — ABNORMAL HIGH (ref 150–400)
RBC: 2.91 MIL/uL — ABNORMAL LOW (ref 4.22–5.81)
RDW: 17.3 % — ABNORMAL HIGH (ref 11.5–15.5)
WBC: 15.4 10*3/uL — ABNORMAL HIGH (ref 4.0–10.5)
nRBC: 0 % (ref 0.0–0.2)

## 2022-02-10 LAB — COMPREHENSIVE METABOLIC PANEL
ALT: 18 U/L (ref 0–44)
AST: 22 U/L (ref 15–41)
Albumin: 2.4 g/dL — ABNORMAL LOW (ref 3.5–5.0)
Alkaline Phosphatase: 72 U/L (ref 38–126)
Anion gap: 13 (ref 5–15)
BUN: 54 mg/dL — ABNORMAL HIGH (ref 6–20)
CO2: 23 mmol/L (ref 22–32)
Calcium: 8.7 mg/dL — ABNORMAL LOW (ref 8.9–10.3)
Chloride: 96 mmol/L — ABNORMAL LOW (ref 98–111)
Creatinine, Ser: 12.5 mg/dL — ABNORMAL HIGH (ref 0.61–1.24)
GFR, Estimated: 4 mL/min — ABNORMAL LOW (ref >60)
Glucose, Bld: 174 mg/dL — ABNORMAL HIGH (ref 70–99)
Potassium: 4.5 mmol/L (ref 3.5–5.1)
Sodium: 132 mmol/L — ABNORMAL LOW (ref 135–145)
Total Bilirubin: 0.7 mg/dL (ref 0.3–1.2)
Total Protein: 6.9 g/dL (ref 6.5–8.1)

## 2022-02-10 LAB — GLUCOSE, CAPILLARY
Glucose-Capillary: 180 mg/dL — ABNORMAL HIGH (ref 70–99)
Glucose-Capillary: 103 mg/dL — ABNORMAL HIGH (ref 70–99)
Glucose-Capillary: 225 mg/dL — ABNORMAL HIGH (ref 70–99)
Glucose-Capillary: 218 mg/dL — ABNORMAL HIGH (ref 70–99)

## 2022-02-10 LAB — MAGNESIUM: Magnesium: 2.1 mg/dL (ref 1.7–2.4)

## 2022-02-10 LAB — PHOSPHORUS: Phosphorus: 4.6 mg/dL (ref 2.5–4.6)

## 2022-02-10 MED ORDER — HEPARIN SODIUM (PORCINE) 1000 UNIT/ML IJ SOLN
INTRAMUSCULAR | Status: AC
  Administered 2022-02-10: 1000 [IU]
  Filled 2022-02-10: qty 3

## 2022-02-10 MED ORDER — PENTAFLUOROPROP-TETRAFLUOROETH EX AERO
INHALATION_SPRAY | CUTANEOUS | Status: AC
  Filled 2022-02-10: qty 103.5

## 2022-02-10 NOTE — Consult Note (Addendum)
[facility] Nurse Consult Note: ?Pt is followed by the podiatry service for post-op wound to right foot.  Requested to apply Vac dressing.  ?Reason for Consult: Right outer foot with full thickness post-op wound; 16X8X.5 cm, beefy red with exposed tendons, small amt bloody drainage.  Pt was medicated for pain prior to the procedure and tolerated with minimal amt discomfort.  ?Dressing procedure/placement/frequency: Applied Mepitel over the wound bed to protect the exposed tendons, then 2 barrier rings around the wound edges to attempt to maintain a seal, then one piece black foam to 125mm cont suction.  [place] team will change again on Wed if patient is still in the hospital at that time.  ?Julien Girt MSN, RN, [place], [place], CNS ?[phone]  ? ? ?  ?

## 2022-02-10 NOTE — Progress Notes (Signed)
PT Cancellation Note ? ?Patient Details ?Name: [name] [name]. ?MRN: [id number] ?DOB: 05-May-1961 ? ? ?Cancelled Treatment:    Reason Eval/Treat Not Completed: Patient at procedure or test/unavailable. Pt off the floor at HD. PT to return as able to progress mobility. ? ?Kittie Plater, PT, DPT ?Acute Rehabilitation Services ?Pager #: [phone] ?Office #: [phone] ? ? ? ?Mael Delap M Kaylana Fenstermacher ?02/10/2022, 11:24 AM ? ? ?

## 2022-02-10 NOTE — Progress Notes (Addendum)
?PROGRESS NOTE ? ? ? ?[name] SIEGMANN Sr.  JKD:[id number] DOB: 10/04/1961 DOA: 02/02/2022 ?PCP: [facility]  ?Chief Complaint  ?Patient presents with  ? Altered Mental Status  ? ? ?Brief Narrative:  ?61 y/o male history of DM, ESRD on HD, admitted to the hospital with fever, acute encephalopathy, sepsis and right foot infection.  He has Laurielle Selmon history of prior right fifth metatarsal amputation performed many years ago in [place].  Plain films of his foot indicate swelling and subcutaneous gas without any obvious bony erosion. [facility] presented to [facility] and subsequently transferred to [facility].  Podiatry consulted, he was taken emergently to the [place] on 3/19.  Postoperatively he has had persistent lethargy and was taken to the ICU 3/19 evening.  He was placed on Narcan drip for presumed lethargy due to opioid use however he only got 5 mg of oxycodone.  Narcan drip was discontinued on 3/20 and he was transferred out of the ICU. ? ?See below for additional details ?   ? ? ?Assessment & Plan: ?  ?Principal Problem: ?  Osteomyelitis ([place]) ?Active Problems: ?  Severe sepsis ([place]) ?  Hypotension ?  Acute metabolic encephalopathy ?  Stroke [facility]) ?  ESRD (end stage renal disease) ([place]) ?  DM2 (diabetes mellitus, type 2) ([place]) ?  HTN (hypertension) ?  Chronic systolic CHF (congestive heart failure) ([place]) ?  Acute respiratory failure with hypoxia ([place]) ?  Protein-calorie malnutrition, mild ([place]) ?  GERD (gastroesophageal reflux disease) ?  Carotid stenosis ?  Hyponatremia ?  Hyperkalemia ?  High anion gap metabolic acidosis ?  Anemia of chronic renal failure ?  Necrotizing soft tissue infection ?  Cellulitis in diabetic foot ([place]) ?  Bacteremia ?  Right foot infection ? ? 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 ?Blood cultures from 3/19 with morganella morganii and clostridium species  ?MRI foot/ankle right with soft tissue ulceration of lateral midfoot just lateral to postsurgical remaining base of 5th metatarsal and base of 4th metatarsal, marrow edema within base of 5th greater than lateral base of 4th metatarsals concerning for acute osteomyelitis (see report) ?S/p I&D with removal of residual bone at 4-5 metatarsals on 3/25 ?Wound care nurse for wound vac 3/27 ?follow surg path from 3/25 ?Appreciate ID assistance, continuing vanc, flagyl, and cefepime - low suspicion for endocarditis per ID, TTE without eivdence of valvular vegetations on TTE (per ID, "do not feel strongly regarding getting TEE from ID standpoint given low grade bacteremia with GNR/GPR with right foot being source, would defer need of TEE to neurology if helpful for stroke workup - neurology did not recommend from their standpoint).  Recommending 6 weeks of abx after last debridement intervention (from 3/25). ? ? ?Hypotension ?Post op, likely multifactorial, blood loss, meds ?He was weaned off pressors after small bolus post op 3/25 ?Will continue to trend Hb/hct ?Planning for midodrine 10 mg pre HD ? ?Stroke [facility]) ?MRI brain 3/22 with numerous punctate acute infarctions throughout both cerebral hemispheres, primarily affecting the deep brain ?Echo with Ef 45-50%, global hypokinesis, RVSF mildly reduced ?CTA neck 3/20 without LVO or hemodynamically significant stenosis of intracranial arteries, did have 70% stenosis for R ICA due to mixed  density atherosclerosis ?CT venogram normal ?LDL 46, A1c 12.2 ?Appreciate neurology recommendations - suspect likely 2/2 hypoperfusion with intermittent hypotension.  Aspirin 81 mg daily.  No statin at this time, LDL at goal. ? ? ?Acute metabolic encephalopathy ?Related to stroke as well as  infection and acute hospitalization ?Improving ?Follow EEG - mild diffuse encephalopathy ?Appreciate neurology, concern for cytotoxic lesion of corpus callosum on MRI, likely related to metabolic derangement -> hyperglycemia, uremia, sepsis, etc ?Delirium precautions, will treat underlying cause ? ? ?ESRD (end stage renal disease) ([place]) ?Per renal ?Being evaluated outpatient for possible transplant ?S/p L arm fistulogram with vascular 3/23 -> L central angioplasty innominate and subclavian vein, L peripheral angioplasty cephalic vein ? ?DM2 (diabetes mellitus, type 2) ([place]) ?a1c 12.2 ?Reduced basal insulin, continue SSI ?At home taking 30 units daily ? ? ?HTN (hypertension) ?Intermittent hypotension, concern that this may have resulted in watershed infarcts ?Will hold BP meds for now, not on anything at home ? ?Acute respiratory failure with hypoxia ([place]) ?Currently has been weaned to ra, continue to monitor ?CXR 3/20 without frank edema ? ?Chronic systolic CHF (congestive heart failure) ([place]) ?EF 45% per echo 05/2021 ?Echo 3/22 with EF 45-50% ?Volume per renal ? ?Carotid stenosis ?Outpatient vascular follow up ? ?GERD (gastroesophageal reflux disease) ?Continue Protonix ? ? ?DVT prophylaxis: heparin ?Code Status: full ?Family Communication: daughter 3/22 - called 3/24, no answer ?Disposition:  ? ?Status is: Inpatient ?Remains inpatient appropriate because: need for IV abx, continued w/u encephalopathy ?  ?Consultants:  ?Neurology ?Vascular ?ID ?Renal ?podiatry ? ?Procedures:  ?3/25 ?PROCEDURE PERFORMED: Right irrigation and debridement with removal of residual bone at 4-5 metatarsals ? ?3/19 ?Procedure:  ?1. Right foot incision and drainage and revision of fifth ray amputation  ? ?3/23 ?left brachiocephalic arteriovenous fistula cannulation under ultrasound guidance ?left arm fistulogram including central venogram ?left central angioplasty innominate and subclavian vein (10 mm Mustang, 12 mm Mustang, 12 mm drug  coated Lutonix) ?left peripheral angioplasty cephalic vein (8 mm Mustang) ? ?Echo ?IMPRESSIONS  ? ? ? 1. Left ventricular ejection fraction, by estimation, is 45 to 50%. The  ?left ventricle has mildly decreased function. The left ventricle  ?demonstrates global hypokinesis. There is moderate left ventricular  ?hypertrophy. Left ventricular diastolic function  ? could not be evaluated.  ? 2. Right ventricular systolic function is mildly reduced. The right  ?ventricular size is normal.  ? 3. The mitral valve is degenerative. Mild mitral valve regurgitation. No  ?evidence of mitral stenosis. Moderate to severe mitral annular  ?calcification.  ? 4. The aortic valve is tricuspid. There is moderate calcification of the  ?aortic valve. There is moderate thickening of the aortic valve. Aortic  ?valve regurgitation is not visualized. Mild aortic valve stenosis. Aortic  ?valve area, by VTI measures 2.26  ?cm?Marland Kitchen Aortic valve mean gradient measures 8.0 mmHg. Aortic valve Vmax  ?measures 1.92 m/s.  ? ?Conclusion(s)/Recommendation(s): No evidence of valvular vegetations on  ?this transthoracic echocardiogram. Consider Pricsilla Lindvall transesophageal  ?echocardiogram to exclude infective endocarditis if clinically indicated. ? ?Burnard Bunting ?Summary:  ?Right: Resting right ankle-brachial index is within normal range. No  ?evidence of significant right lower extremity arterial disease. The right  ?toe-brachial index is normal.  ? ?Left: Resting left ankle-brachial index indicates noncompressible left  ?lower extremity arteries. The left toe-brachial index is abnormal. ? ?Antimicrobials:  ?Anti-infectives (From admission, onward)  ? ? Start     Dose/Rate Route Frequency Ordered Stop  ? 02/03/22 2200  metroNIDAZOLE (FLAGYL) tablet 500 mg       ?  500 mg Oral Every 12 hours 02/03/22 1108 03/22/22 2359  ? 02/03/22 2000  ceFEPIme (MAXIPIME) 2 g in sodium chloride 0.9 % 100 mL IVPB       ? 2 g ?200 mL/hr over 30 Minutes Intravenous Every M-W-F (2000) 02/02/22  0459 03/22/22 2359  ? 02/03/22 1200  vancomycin (VANCOCIN) IVPB 1000 mg/200 mL premix       ? 1,000 mg ?200 mL/hr over 60 Minutes Intravenous Every M-W-F (Hemodialysis) 02/02/22 0459 03/22/22 2359  ? 03/20/

## 2022-02-10 NOTE — Procedures (Signed)
I was present at this dialysis session. I have reviewed the session itself and made appropriate changes.  ? ?1L UF, 2K bath.  BPs stable. Using AVF.  Pt w/o complaints or concerns.  Cont HD on schedule.  ? ?Filed Weights  ? 02/09/22 0500 02/10/22 0500 02/10/22 0730  ?Weight: 121 kg 118 kg 118 kg  ? ? ?Recent Labs  ?Lab 02/10/22 ?0421  ?NA 132*  ?K 4.5  ?CL 96*  ?CO2 23  ?GLUCOSE 174*  ?BUN 54*  ?CREATININE 12.50*  ?CALCIUM 8.7*  ?PHOS 4.6  ? ? ?Recent Labs  ?Lab 02/08/22 ?0451 02/08/22 ?1142 02/09/22 ?0751 02/09/22 ?1613 02/10/22 ?0421  ?WBC 10.7*  --  12.6*  --  15.4*  ?NEUTROABS 6.7  --  8.1*  --  10.6*  ?HGB 11.0*   < > 8.4* 8.6* 9.1*  ?HCT 34.2*   < > 25.6* 26.3* 27.2*  ?MCV 94.0  --  94.5  --  93.5  ?PLT 351  --  368  --  436*  ? < > = values in this interval not displayed.  ? ? ?Scheduled Meds: ? (feeding supplement) PROSource Plus  30 mL Oral BID BM  ? acetaminophen  1,000 mg Oral Q8H  ? vitamin C  500 mg Oral Daily  ? aspirin EC  81 mg Oral Daily  ? Chlorhexidine Gluconate Cloth  6 each Topical Q0600  ? famotidine  20 mg Oral Daily  ? feeding supplement (NEPRO CARB STEADY)  237 mL Oral BID BM  ? heparin  5,000 Units Subcutaneous Q8H  ? insulin aspart  0-15 Units Subcutaneous TID WC  ? insulin aspart  0-5 Units Subcutaneous QHS  ? insulin glargine-yfgn  20 Units Subcutaneous Daily  ? metroNIDAZOLE  500 mg Oral Q12H  ? midodrine  10 mg Oral Q M,W,F-HD  ? multivitamin  1 tablet Oral QHS  ? pantoprazole  40 mg Oral Q1200  ? pentafluoroprop-tetrafluoroeth      ? sucroferric oxyhydroxide  1,000 mg Oral TID WC  ? thiamine injection  100 mg Intravenous Daily  ? Or  ? thiamine  100 mg Oral Daily  ? zinc sulfate  220 mg Oral BID  ? ?Continuous Infusions: ? ceFEPime (MAXIPIME) IV 2 g (02/07/22 2140)  ? sodium chloride    ? vancomycin Stopped (02/07/22 1640)  ? ?PRN Meds:.acetaminophen **FOLLOWED BY** [START ON 02/11/2022] acetaminophen, albuterol, docusate sodium, fentaNYL (SUBLIMAZE) injection, ondansetron **OR**  ondansetron (ZOFRAN) IV, oxyCODONE, polyethylene glycol, sodium chloride   ?Pearson Grippe  MD ?02/10/2022, 8:19 AM ?  ?

## 2022-02-10 NOTE — [place] FL2 (Signed)
?[place] MEDICAID FL2 LEVEL OF CARE SCREENING TOOL  ?  ? ?IDENTIFICATION  ?Patient Name: ?[name] MARTE Sr. Birthdate: November 11, 1961 Sex: male Admission Date (Current Location): ?02/02/2022  ?[place] and [place] Number: ? [place] and Address:  ?[facility] [place], [place] [address] [place], [place] [address] ?     Provider Number: ?[id number]  ?Attending Physician Name and Address:  ?Elodia Florence., * ? Relative Name and Phone Number:  ?  ?   ?Current Level of Care: ?Hospital Recommended Level of Care: ?[place] Prior Approval Number: ?  ? ?Date Approved/Denied: ?  PASRR Number: ?[id number] A ? ?Discharge Plan: ?SNF ?  ? ?Current Diagnoses: ?Patient Active Problem List  ? Diagnosis Date Noted  ? Hypotension 02/08/2022  ? Right foot infection   ? Stroke [facility]) 02/05/2022  ? Carotid stenosis 02/05/2022  ? Bacteremia   ? Hyponatremia 02/03/2022  ? Hyperkalemia 02/03/2022  ? High anion gap metabolic acidosis 94/17/4081  ? Anemia of chronic renal failure 02/03/2022  ? Necrotizing soft tissue infection   ? Cellulitis in diabetic foot ([place])   ? Osteomyelitis ([place]) 02/02/2022  ? Acute metabolic encephalopathy 44/81/8563  ? Severe sepsis ([place]) 02/02/2022  ? Acute respiratory failure with hypoxia ([place]) 02/02/2022  ? Protein-calorie malnutrition, mild ([place]) 02/02/2022  ? GERD (gastroesophageal reflux disease) 02/02/2022  ? Chronic systolic CHF (congestive heart failure) ([place]) 08/14/2019  ? LBBB (left bundle branch block) 08/13/2019  ? Problem with vascular access 08/13/2019  ? Dialysis AV fistula malfunction ([place]) 08/12/2019  ? ESRD (end stage renal disease) ([place]) 09/20/2018  ? Fever 09/20/2018  ? Nausea & vomiting 09/20/2018  ? DM2 (diabetes mellitus, type 2) ([place]) 09/20/2018  ? HTN (hypertension) 09/20/2018  ? ? ?Orientation RESPIRATION BLADDER Height & Weight   ?  ?Self, Time, Situation, Place ? Normal Continent Weight: 260 lb 2.3 oz (118 kg) ?Height:  6\' 1"  (185.4 cm)   ?BEHAVIORAL SYMPTOMS/MOOD NEUROLOGICAL BOWEL NUTRITION STATUS  ?    Continent Diet (carb modified, renal with fluid restriction 1200 mL)  ?AMBULATORY STATUS COMMUNICATION OF NEEDS Skin   ?Extensive Assist Verbally Surgical wounds, Other (Comment) (right foot, compression wrap dressing) ?  ?  ?  ?    ?     ?     ? ? ?Personal Care Assistance Level of Assistance  ?Bathing, Feeding, Dressing Bathing Assistance: Maximum assistance ?Feeding assistance: Independent ?Dressing Assistance: Maximum assistance ?   ? ?Functional Limitations Info  ?Speech   ?  ?Speech Info: Impaired (delayed responses)  ? ? ?SPECIAL CARE FACTORS FREQUENCY  ?PT (By licensed PT), OT (By licensed OT), Speech therapy   ?  ?PT Frequency: 5x/wk ?OT Frequency: 5x/wk ?  ?  ?Speech Therapy Frequency: 5x/wk ?   ? ? ?Contractures Contractures Info: Not present  ? ? ?Additional Factors Info  ?Code Status, Allergies Code Status Info: Full ?Allergies Info: Sulfa Antibiotics ?  ?  ?  ?   ? ?Current Medications (02/10/2022):  This is the current hospital active medication list ?Current Facility-Administered Medications  ?Medication Dose Route Frequency Provider Last Rate Last Admin  ? (feeding supplement) PROSource Plus liquid 30 mL  30 mL Oral BID BM [name], MD   30 mL at 02/09/22 1045  ? acetaminophen (TYLENOL) tablet 1,000 mg  1,000 mg Oral Q8H Elodia Florence., MD   1,000 mg at 02/10/22 1497  ? Followed by  ? [START ON 02/11/2022] acetaminophen (TYLENOL) tablet 650  mg  650 mg Oral Q6H PRN Elodia Florence., MD      ? albuterol (PROVENTIL) (2.5 MG/3ML) 0.083% nebulizer solution 2.5 mg  2.5 mg Nebulization Q2H PRN [name], MD      ? ascorbic acid (VITAMIN C) tablet 500 mg  500 mg Oral Daily [name], MD   500 mg at 02/09/22 1046  ? aspirin EC tablet 81 mg  81 mg Oral Daily [name], MD   81 mg at 02/09/22 1046  ? ceFEPIme (MAXIPIME) 2 g in sodium chloride 0.9 % 100 mL IVPB  2 g Intravenous Q  M,W,F-2000 [name], MD 200 mL/hr at 02/07/22 2140 2 g at 02/07/22 2140  ? Chlorhexidine Gluconate Cloth 2 % PADS 6 each  6 each Topical Q0600 [name], MD   6 each at 02/10/22 [phone]  ? docusate sodium (COLACE) capsule 100 mg  100 mg Oral BID PRN [name], MD   100 mg at 02/09/22 1046  ? famotidine (PEPCID) tablet 20 mg  20 mg Oral Daily [name], MD   20 mg at 02/09/22 2259  ? feeding supplement (NEPRO CARB STEADY) liquid 237 mL  237 mL Oral BID BM [name], MD   237 mL at 02/07/22 3267  ? fentaNYL (SUBLIMAZE) injection 12.5 mcg  12.5 mcg Intravenous Q2H PRN Elodia Florence., MD   12.5 mcg at 02/08/22 2025  ? heparin injection 5,000 Units  5,000 Units Subcutaneous Q8H [name], MD   5,000 Units at 02/10/22 [phone]  ? insulin aspart (novoLOG) injection 0-15 Units  0-15 Units Subcutaneous TID WC Elodia Florence., MD   3 Units at 02/10/22 [phone]  ? insulin aspart (novoLOG) injection 0-5 Units  0-5 Units Subcutaneous QHS Elodia Florence., MD      ? insulin glargine-yfgn [facility]) injection 20 Units  20 Units Subcutaneous Daily [name], MD   20 Units at 02/09/22 1045  ? metroNIDAZOLE (FLAGYL) tablet 500 mg  500 mg Oral Q12H [name], MD   500 mg at 02/09/22 2259  ? midodrine (PROAMATINE) tablet 10 mg  10 mg Oral Q M,W,F-HD Roney Jaffe, MD      ? multivitamin (RENA-VIT) tablet 1 tablet  1 tablet Oral QHS [name], MD   1 tablet at 02/09/22 2259  ? ondansetron (ZOFRAN) tablet 4 mg  4 mg Oral Q6H PRN [name], MD      ? Or  ? ondansetron [facility]) injection 4 mg  4 mg Intravenous Q6H PRN [name], MD      ? oxyCODONE (Oxy IR/ROXICODONE) immediate release tablet 5 mg  5 mg Oral Q6H PRN Elodia Florence., MD   5 mg at 02/09/22 1046  ? pantoprazole (PROTONIX) EC tablet 40 mg  40 mg Oral Q1200 [name], MD   40 mg at 02/09/22 1046  ? pentafluoroprop-tetrafluoroeth  (GEBAUERS) aerosol           ? polyethylene glycol (MIRALAX / GLYCOLAX) packet 17 g  17 g Oral Daily PRN [name], MD   17 g at 02/09/22 2355  ? sodium chloride 0.9 % bolus 250 mL  250 mL Intravenous Once PRN [name], MD      ? sucroferric oxyhydroxide [facility]) chewable tablet 1,000 mg  1,000 mg Oral TID WC [name], MD   1,000 mg at 02/09/22 1730  ? thiamine (B-1)  injection 100 mg  100 mg Intravenous Daily [name], MD   100 mg at 02/07/22 5003  ? Or  ? thiamine tablet 100 mg  100 mg Oral Daily [name], MD   100 mg at 02/09/22 1046  ? vancomycin (VANCOCIN) IVPB 1000 mg/200 mL premix  1,000 mg Intravenous Q M,W,F-HD [name], MD   Stopped at 02/07/22 1640  ? zinc sulfate capsule 220 mg  220 mg Oral BID [name], MD   220 mg at 02/09/22 2300  ? ? ? ?Discharge Medications: ?Please see discharge summary for a list of discharge medications. ? ?Relevant Imaging Results: ? ?Relevant Lab Results: ? ? ?Additional Information ?SS#: [id number]; HD MWF at [facility] at 11:15 ? ?Geralynn Ochs, LCSW ? ? ? ? ?

## 2022-02-10 NOTE — TOC Progression Note (Signed)
Transition of Care (TOC) - Progression Note  ? ? ?Patient Details  ?Name: [name] MILLIGAN Sr. ?MRN: [id number] ?Date of Birth: 06-08-1961 ? ?Transition of Care (TOC) CM/SW Contact  ?Geralynn Ochs, LCSW ?Phone Number: ?02/10/2022, 2:02 PM ? ?Clinical Narrative:   CSW faxed out referral for patient for SNF. CSW spoke with renal navigator, as previous TOC note says that family was requesting placement in [place] and patient's current dialysis is in [place]. Per renal navigator, there are not many HD spots available in [place] at the moment, so patient may not be able to transfer or may not get the closest HD center to the SNF. Awaiting bed offers to provide to patient/family. ? ? ? ?Expected Discharge Plan: [place] ?Barriers to Discharge: Continued Medical Work up, SNF Pending bed offer ? ?Expected Discharge Plan and Services ?Expected Discharge Plan: [place] ?  ?  ?  ?  ?                ?  ?  ?  ?  ?  ?  ?  ?  ?  ?  ? ? ?Social Determinants of Health (SDOH) Interventions ?  ? ?Readmission Risk Interventions ?   ? View : No data to display.  ?  ?  ?  ? ? ?

## 2022-02-10 NOTE — Progress Notes (Signed)
Pharmacy Antibiotic Note- follow-up ? ?[name] BENNIS Sr. is a 61 y.o. male admitted on 02/02/2022 with bacteremia secondary to foot abscess.  Pharmacy has been consulted for vancomycin and cefepime dosing. Patient has HD MWF and gets vancomycin 1 g IV post HD. VR 27. New BCx result with morganella morganii, clostridium species resistant to ampicillin, unasyn, and cefazolin. Continue all abx for 6 weeks after last I&D (stop date: 03/22/22). ? ?Plan: ?Continue vancomycin 1 g qMWF post HD  ?Continue cefepime 2 g IV qMWF post HD ?Continue flagyl 500 mg PO BID ? ?Height: 6\' 1"  (185.4 cm) ?Weight: 118 kg (260 lb 2.3 oz) ?IBW/kg (Calculated) : 79.9 ? ?Temp (24hrs), Avg:98.4 ?F (36.9 ?C), Min:97.9 ?F (36.6 ?C), Max:98.6 ?F (37 ?C) ? ?Recent Labs  ?Lab 02/06/22 ?0132 02/07/22 ?5643 02/08/22 ?0451 02/09/22 ?3295 02/10/22 ?0421  ?WBC 10.2 10.2 10.7* 12.6* 15.4*  ?CREATININE 8.57* 11.45* 8.77* 11.00* 12.50*  ?VANCORANDOM  --  27  --   --   --   ? ?  ?Estimated Creatinine Clearance: 8.6 mL/min (A) (by C-G formula based on SCr of 12.5 mg/dL (H)).   ? ?Allergies  ?Allergen Reactions  ? Sulfa Antibiotics Nausea And Vomiting  ? ? ?Antimicrobials this admission: ?Cefepime 3/20 >> [5/6] ?Metronidazole 3/20 >> [5/6] ?Vancomycin 3/20 >> [5/6] ? ?Dose adjustments this admission: ?None. ? ?Microbiology results: ?3/19 BCx: morganella morganii (resistant to amp, unasyn, and cefazolin) ?3/19 WCx morganella morganii (resistant to amp, unasyn, and cefazolin) ?3/20 MRSA PCR surgical: positive ? ? ?Thank you for allowing pharmacy to be a part of this patient?s care. ? ?[name] [name] BS, PharmD, BCPS ?Clinical Pharmacist ?02/10/2022 12:02 PM ? ? ?

## 2022-02-11 ENCOUNTER — Inpatient Hospital Stay (HOSPITAL_COMMUNITY): Payer: Medicare Other

## 2022-02-11 DIAGNOSIS — M869 Osteomyelitis, unspecified: Secondary | ICD-10-CM | POA: Diagnosis not present

## 2022-02-11 LAB — COMPREHENSIVE METABOLIC PANEL
ALT: 19 U/L (ref 0–44)
AST: 24 U/L (ref 15–41)
Albumin: 2.3 g/dL — ABNORMAL LOW (ref 3.5–5.0)
Alkaline Phosphatase: 63 U/L (ref 38–126)
Anion gap: 13 (ref 5–15)
BUN: 31 mg/dL — ABNORMAL HIGH (ref 6–20)
CO2: 25 mmol/L (ref 22–32)
Calcium: 8.3 mg/dL — ABNORMAL LOW (ref 8.9–10.3)
Chloride: 95 mmol/L — ABNORMAL LOW (ref 98–111)
Creatinine, Ser: 9 mg/dL — ABNORMAL HIGH (ref 0.61–1.24)
GFR, Estimated: 6 mL/min — ABNORMAL LOW (ref >60)
Glucose, Bld: 140 mg/dL — ABNORMAL HIGH (ref 70–99)
Potassium: 4.3 mmol/L (ref 3.5–5.1)
Sodium: 133 mmol/L — ABNORMAL LOW (ref 135–145)
Total Bilirubin: 0.9 mg/dL (ref 0.3–1.2)
Total Protein: 6.7 g/dL (ref 6.5–8.1)

## 2022-02-11 LAB — GLUCOSE, CAPILLARY
Glucose-Capillary: 146 mg/dL — ABNORMAL HIGH (ref 70–99)
Glucose-Capillary: 139 mg/dL — ABNORMAL HIGH (ref 70–99)
Glucose-Capillary: 180 mg/dL — ABNORMAL HIGH (ref 70–99)
Glucose-Capillary: 178 mg/dL — ABNORMAL HIGH (ref 70–99)
Glucose-Capillary: 123 mg/dL — ABNORMAL HIGH (ref 70–99)
Glucose-Capillary: 147 mg/dL — ABNORMAL HIGH (ref 70–99)

## 2022-02-11 LAB — CBC WITH DIFFERENTIAL/PLATELET
Abs Immature Granulocytes: 0.38 10*3/uL — ABNORMAL HIGH (ref 0.00–0.07)
Basophils Absolute: 0.1 10*3/uL (ref 0.0–0.1)
Basophils Relative: 1 %
Eosinophils Absolute: 0.6 10*3/uL — ABNORMAL HIGH (ref 0.0–0.5)
Eosinophils Relative: 4 %
HCT: 25.8 % — ABNORMAL LOW (ref 39.0–52.0)
Hemoglobin: 8.4 g/dL — ABNORMAL LOW (ref 13.0–17.0)
Immature Granulocytes: 3 %
Lymphocytes Relative: 14 %
Lymphs Abs: 2 10*3/uL (ref 0.7–4.0)
MCH: 30.8 pg (ref 26.0–34.0)
MCHC: 32.6 g/dL (ref 30.0–36.0)
MCV: 94.5 fL (ref 80.0–100.0)
Monocytes Absolute: 1.7 10*3/uL — ABNORMAL HIGH (ref 0.1–1.0)
Monocytes Relative: 11 %
Neutro Abs: 10 10*3/uL — ABNORMAL HIGH (ref 1.7–7.7)
Neutrophils Relative %: 67 %
Platelets: 441 10*3/uL — ABNORMAL HIGH (ref 150–400)
RBC: 2.73 MIL/uL — ABNORMAL LOW (ref 4.22–5.81)
RDW: 17.7 % — ABNORMAL HIGH (ref 11.5–15.5)
WBC: 14.7 10*3/uL — ABNORMAL HIGH (ref 4.0–10.5)
nRBC: 0 % (ref 0.0–0.2)

## 2022-02-11 LAB — PHOSPHORUS: Phosphorus: 4.2 mg/dL (ref 2.5–4.6)

## 2022-02-11 LAB — MAGNESIUM: Magnesium: 1.9 mg/dL (ref 1.7–2.4)

## 2022-02-11 MED ORDER — SORBITOL 70 % SOLN
960.0000 mL | TOPICAL_OIL | Freq: Once | ORAL | Status: AC
  Administered 2022-02-11: 960 mL via RECTAL
  Filled 2022-02-11: qty 473

## 2022-02-11 MED ORDER — DARBEPOETIN ALFA 100 MCG/0.5ML IJ SOSY
100.0000 ug | PREFILLED_SYRINGE | INTRAMUSCULAR | Status: DC
  Administered 2022-02-12 – 2022-02-19 (×2): 100 ug via INTRAVENOUS
  Filled 2022-02-11 (×4): qty 0.5

## 2022-02-11 NOTE — Progress Notes (Signed)
?[facility] ?Progress Note  ? ?Subjective:  ?No c/o ?HD yesterday 1.3L UF tol well ? ?Objective ?Vitals:  ? 02/10/22 2341 02/11/22 0427 02/11/22 0500 02/11/22 1059  ?BP: (!) 114/58 (!) 94/54  (!) 145/67  ?Pulse: 76 77  80  ?Resp: 15 15  18   ?Temp: 99 ?F (37.2 ?C) 97.6 ?F (36.4 ?C)  98 ?F (36.7 ?C)  ?TempSrc: Oral Axillary    ?SpO2: 100% 98%  100%  ?Weight:   114.9 kg   ?Height:      ? ?Physical Exam ?General: Chronically ill appearing male in NAD ?Neuro: nonfocal, cn2-12 grossly intact ?Heart: S1,S2 No M/R/G ?Lungs: CTAB, no WOB ?Abdomen: Obese, NT, ND ?Extremities:no LE edema; wound vac on foot ?Dialysis Access: L AVF +T/B. ? ?Dialysis Orders:  MWF [facility] ?4h 70min  450/500    117.8kg   2/ 2.5Ca bath  Hep 6000  L AVF ? - on 3/19 > hep B Ag neg, and Hep B Ab's high/ protective ? - calcitriol 1.89mcg PO qHD-last 01/31/22 ?  ?Assessment/Plan: ?R foot osteomyelitis- on IV vanc/cefepime/Flagyl. + Morganella bacteremia 2/2 foot abscess. S/p R foot I&D on 3/19 by podiatry and another I&D done today 3/25 now with wound vac. Will need 6 weeks ABX for attempted limb salvage -- Vanc and cefepime ?Acute metabolic encephalopathy: Improved. Per primary.  ?CVA: Neurology consulted. MRI done 02/04/2022 Numerous bilat punctate acute infarctions. Per primary.  ?ESRD - on HD MWF. Cont on schedule: 2K, no heparin, AVF, 450/800, 2.5 Ca, keep SBP > 120 ?Hypertension/volume  - Blood pressures acceptable; euvolemic on exam. UF as tolerated. Avoid hypotension during HD. Keep SBP >120.  ?Anemia of CKD - Hb drifting down, ESA darbe 100 next Tx cont qWed, check Fe but would likely not give in setting of #1 ?Secondary Hyperparathyroidism -  CCa okay. Continue VDRA with HD. Phos in range  ?HD access: L AVF w/ thinning, ulcerated area at the lower portion. Stick away from area. VVS consulted and went for fistulogram on 3/23 by VVS > they did L central venoplasty to innominate and subclavian veins, and peripheral venoplasty to the  cephalic vein.  ? ?Rexene Agent, MD  ?02/11/2022, 12:03 PM ? ? ? ? ?Recent Labs  ?Lab 02/10/22 ?0421 02/11/22 ?1962  ?HGB 9.1* 8.4*  ?ALBUMIN 2.4* 2.3*  ?CALCIUM 8.7* 8.3*  ?PHOS 4.6 4.2  ?CREATININE 12.50* 9.00*  ?K 4.5 4.3  ? ? ? ? ?Medications: ? ceFEPime (MAXIPIME) IV 2 g (02/10/22 2024)  ? sodium chloride    ? vancomycin 1,000 mg (02/10/22 1501)  ? ? (feeding supplement) PROSource Plus  30 mL Oral BID BM  ? acetaminophen  1,000 mg Oral Q8H  ? vitamin C  500 mg Oral Daily  ? aspirin EC  81 mg Oral Daily  ? Chlorhexidine Gluconate Cloth  6 each Topical Q0600  ? famotidine  20 mg Oral Daily  ? feeding supplement (NEPRO CARB STEADY)  237 mL Oral BID BM  ? heparin  5,000 Units Subcutaneous Q8H  ? insulin aspart  0-15 Units Subcutaneous TID WC  ? insulin aspart  0-5 Units Subcutaneous QHS  ? insulin glargine-yfgn  20 Units Subcutaneous Daily  ? metroNIDAZOLE  500 mg Oral Q12H  ? midodrine  10 mg Oral Q M,W,F-HD  ? multivitamin  1 tablet Oral QHS  ? pantoprazole  40 mg Oral Q1200  ? sucroferric oxyhydroxide  1,000 mg Oral TID WC  ? thiamine injection  100 mg Intravenous Daily  ?  Or  ? thiamine  100 mg Oral Daily  ? zinc sulfate  220 mg Oral BID  ? ? ? ? ?  ? ?

## 2022-02-11 NOTE — Assessment & Plan Note (Addendum)
-  Unclear etiology ?-Likely secondary to constipation/fecal impaction. ?-KUB with stool in rectosigmoid concerning for constipation/fecal impaction ?Disimpaction/enema ordered with good results. ?-Improved clinically. ?-Patient denies any further nausea or vomiting. ?-Patient diet advanced to a soft diet which he is tolerating. ?-Continue current bowel regimen of MiraLAX twice daily, Senokot-S twice daily. ?-IV antiemetics as needed.   ?-C/w Supportive care. ?

## 2022-02-11 NOTE — Plan of Care (Signed)
Pt has had 3 episodes of vomiting. MD aware and new orders placed. C/o pain this morning to his right foot no other c/o pain. PRN meds given.  ? ?Problem: Education: ?Goal: Knowledge of General Education information will improve ?Description: Including pain rating scale, medication(s)/side effects and non-pharmacologic comfort measures ?Outcome: Progressing ?  ?Problem: Health Behavior/Discharge Planning: ?Goal: Ability to manage health-related needs will improve ?Outcome: Progressing ?  ?Problem: Clinical Measurements: ?Goal: Ability to maintain clinical measurements within normal limits will improve ?Outcome: Progressing ?Goal: Will remain free from infection ?Outcome: Progressing ?Goal: Diagnostic test results will improve ?Outcome: Progressing ?Goal: Respiratory complications will improve ?Outcome: Progressing ?Goal: Cardiovascular complication will be avoided ?Outcome: Progressing ?  ?Problem: Activity: ?Goal: Risk for activity intolerance will decrease ?Outcome: Progressing ?  ?Problem: Nutrition: ?Goal: Adequate nutrition will be maintained ?Outcome: Progressing ?  ?Problem: Coping: ?Goal: Level of anxiety will decrease ?Outcome: Progressing ?  ?Problem: Elimination: ?Goal: Will not experience complications related to bowel motility ?Outcome: Progressing ?Goal: Will not experience complications related to urinary retention ?Outcome: Progressing ?  ?Problem: Pain Managment: ?Goal: General experience of comfort will improve ?Outcome: Progressing ?  ?Problem: Safety: ?Goal: Ability to remain free from injury will improve ?Outcome: Progressing ?  ?Problem: Skin Integrity: ?Goal: Risk for impaired skin integrity will decrease ?Outcome: Progressing ?  ?Problem: Education: ?Goal: Knowledge of disease or condition will improve ?Outcome: Progressing ?Goal: Knowledge of secondary prevention will improve (SELECT ALL) ?Outcome: Progressing ?Goal: Knowledge of patient specific risk factors will improve (INDIVIDUALIZE FOR  PATIENT) ?Outcome: Progressing ?  ?Problem: Coping: ?Goal: Will verbalize positive feelings about self ?Outcome: Progressing ?Goal: Will identify appropriate support needs ?Outcome: Progressing ?  ?Problem: Health Behavior/Discharge Planning: ?Goal: Ability to manage health-related needs will improve ?Outcome: Progressing ?  ?Problem: Self-Care: ?Goal: Ability to participate in self-care as condition permits will improve ?Outcome: Progressing ?Goal: Verbalization of feelings and concerns over difficulty with self-care will improve ?Outcome: Progressing ?Goal: Ability to communicate needs accurately will improve ?Outcome: Progressing ?  ?Problem: Nutrition: ?Goal: Risk of aspiration will decrease ?Outcome: Progressing ?Goal: Dietary intake will improve ?Outcome: Progressing ?  ?Problem: Ischemic Stroke/TIA Tissue Perfusion: ?Goal: Complications of ischemic stroke/TIA will be minimized ?Outcome: Progressing ?  ?Problem: Education: ?Goal: Knowledge of disease or condition will improve ?Outcome: Progressing ?Goal: Knowledge of secondary prevention will improve (SELECT ALL) ?Outcome: Progressing ?Goal: Individualized Educational Video(s) ?Outcome: Progressing ?  ?Problem: Coping: ?Goal: Will verbalize positive feelings about self ?Outcome: Progressing ?Goal: Will identify appropriate support needs ?Outcome: Progressing ?  ?Problem: Health Behavior/Discharge Planning: ?Goal: Ability to manage health-related needs will improve ?Outcome: Progressing ?  ?Problem: Self-Care: ?Goal: Ability to participate in self-care as condition permits will improve ?Outcome: Progressing ?Goal: Verbalization of feelings and concerns over difficulty with self-care will improve ?Outcome: Progressing ?Goal: Ability to communicate needs accurately will improve ?Outcome: Progressing ?  ?Problem: Nutrition: ?Goal: Risk of aspiration will decrease ?Outcome: Progressing ?Goal: Dietary intake will improve ?Outcome: Progressing ?  ?Problem:  Education: ?Goal: Knowledge of secondary prevention will improve (SELECT ALL) ?Outcome: Progressing ?  ?

## 2022-02-11 NOTE — Progress Notes (Signed)
?PROGRESS NOTE ? ? ? ?[name] MARONE Sr.  TWS:[id number] DOB: [birth year] DOA: 02/02/2022 ?PCP: [facility]  ?Chief Complaint  ?Patient presents with  ? Altered Mental Status  ? ? ?Brief Narrative:  ?61 y/o male history of DM, ESRD on HD, admitted to the hospital with fever, acute encephalopathy, sepsis and right foot infection.  He has Amaru Burroughs history of prior right fifth metatarsal amputation performed many years ago in [place].  Plain films of his foot indicate swelling and subcutaneous gas without any obvious bony erosion. [facility] presented to [facility] and subsequently transferred to [facility].  Podiatry consulted, he was taken emergently to the [facility] [place] on 3/19.  Postoperatively he has had persistent lethargy and was taken to the ICU 3/19 evening.  He was placed on Narcan drip for presumed lethargy due to opioid use however he only got 5 mg of oxycodone.  Narcan drip was discontinued on 3/20 and he was transferred out of the ICU. He continued to have altered mental status and was found to have findings consistent with acute strokes.  Neurology was consulted and suspects related to hypotension.  His mental status has gradually improved.  Blood cultures were positive with morganella and clostridium related to his foot infection.  ID has been consulted and recommended 6 weeks of abx after last debridement.  He went back to OR on 3/25 for I&D with podiatry.  Hospitalization c/b malfunctioning L AV fistula, he's s/p fistulogram with vascular on 3/23.  At this time, plan is for d/c to SNF when bed available.  He had nausea/vomiting of unclear etiology 3/28, workup pending. ? ?See below for additional details ?   ? ? ?Assessment & Plan: ?  ?Principal Problem: ?  Osteomyelitis ([place]) ?Active Problems: ?  Severe sepsis ([place]) ?  Nausea & vomiting ?  Hypotension ?  Acute metabolic encephalopathy ?  Stroke [facility]) ?  ESRD (end stage renal disease) ([place]) ?  DM2 (diabetes mellitus, type 2) ([place]) ?  HTN  (hypertension) ?  Chronic systolic CHF (congestive heart failure) ([place]) ?  Acute respiratory failure with hypoxia ([place]) ?  Protein-calorie malnutrition, mild ([place]) ?  GERD (gastroesophageal reflux disease) ?  Carotid stenosis ?  Hyponatremia ?  Hyperkalemia ?  High anion gap metabolic acidosis ?  Anemia of chronic renal failure ?  Necrotizing soft tissue infection ?  Cellulitis in diabetic foot ([place]) ?  Bacteremia ?  Right foot infection ? ? 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 ?Blood cultures from 3/19 with morganella morganii and clostridium species  ?MRI foot/ankle right with soft tissue ulceration of lateral midfoot just lateral to postsurgical remaining base of 5th metatarsal and base of 4th metatarsal, marrow edema within base of 5th greater than lateral base of 4th metatarsals concerning for acute osteomyelitis (see report) ?S/p I&D with removal of residual bone at 4-5 metatarsals on 3/25 ?Wound vac placed 3/28 ?follow surg path from 3/25 ?Appreciate ID assistance, continuing vanc, flagyl, and cefepime - low suspicion for endocarditis per ID, TTE without eivdence of valvular vegetations on TTE (per ID, "do not feel strongly regarding getting TEE from ID standpoint given low grade bacteremia with GNR/GPR with right foot being source, would defer need of  TEE to neurology if helpful for stroke workup - neurology did not recommend from their standpoint).  Recommending 6 weeks of abx after last debridement intervention (from 3/25). ? ? ?Hypotension ?Post op, likely multifactorial, blood loss, meds ?He was weaned off pressors after small bolus post op 3/25 ?Will continue to trend  Hb/hct ?Planning for midodrine 10 mg pre HD ? ?Nausea & vomiting ?Unclear etiology ?KUB with stool in rectosigmoid concerning for constipation/fecal impaction ?Disimpaction/enema ordered ?Benign abdomen, will resume diet and follow ADAT ? ?Stroke [facility]) ?MRI brain 3/22 with numerous punctate acute infarctions throughout both cerebral hemispheres, primarily affecting the deep brain ?Echo with Ef 45-50%, global hypokinesis, RVSF mildly reduced ?CTA neck 3/20 without LVO or hemodynamically significant stenosis of intracranial arteries, did have 70% stenosis for R ICA due to mixed density atherosclerosis ?CT venogram normal ?LDL 46, A1c 12.2 ?Appreciate neurology recommendations - suspect likely 2/2 hypoperfusion with intermittent hypotension.  Aspirin 81 mg daily.  No statin at this time, LDL at goal. ? ? ?Acute metabolic encephalopathy ?Related to stroke as well as infection and acute hospitalization ?Improving ?Follow EEG - mild diffuse encephalopathy ?Appreciate neurology, concern for cytotoxic lesion of corpus callosum on MRI, likely related to metabolic derangement -> hyperglycemia, uremia, sepsis, etc ?Delirium precautions, will treat underlying cause ? ? ?ESRD (end stage renal disease) ([place]) ?Per renal ?Being evaluated outpatient for possible transplant ?S/p L arm fistulogram with vascular 3/23 -> L central angioplasty innominate and subclavian vein, L peripheral angioplasty cephalic vein ? ?DM2 (diabetes mellitus, type 2) ([place]) ?a1c 12.2 ?Reduced basal insulin, continue SSI ?At home taking 30 units daily ? ? ?HTN (hypertension) ?Intermittent hypotension, concern that this may have resulted in watershed infarcts ?Will hold BP meds for now, not on anything at home ? ?Acute respiratory failure with hypoxia ([place]) ?Currently has been weaned to ra, continue to monitor ?CXR 3/20 without frank edema ? ?Chronic systolic CHF (congestive heart failure) ([place]) ?EF 45% per echo 05/2021 ?Echo 3/22 with EF 45-50% ?Volume per  renal ? ?Carotid stenosis ?Outpatient vascular follow up ? ?GERD (gastroesophageal reflux disease) ?Continue Protonix ? ? ?DVT prophylaxis: heparin ?Code Status: full ?Family Communication: daughter 3/22 - called 3/24, no answer ?Disposition:  ? ?Status is: Inpatient ?Remains inpatient appropriate because: need for IV abx, continued w/u encephalopathy ?  ?Consultants:  ?Neurology ?Vascular ?ID ?Renal ?podiatry ? ?Procedures:  ?3/25 ?PROCEDURE PERFORMED: Right irrigation and debridement with removal of residual bone at 4-5 metatarsals ? ?3/19 ?Procedure:  ?1. Right foot incision and drainage and revision of fifth ray amputation  ? ?3/23 ?left brachiocephalic arteriovenous fistula cannulation under ultrasound guidance ?left arm fistulogram including central venogram ?left central angioplasty innominate and subclavian vein (10 mm Mustang, 12 mm Mustang, 12 mm drug coated Lutonix) ?left peripheral angioplasty cephalic vein (8 mm Mustang) ? ?Echo ?IMPRESSIONS  ? ? ? 1. Left ventricular ejection fraction, by estimation, is 45 to 50%. The  ?left ventricle has mildly decreased function. The left ventricle  ?demonstrates global hypokinesis. There is moderate left ventricular  ?hypertrophy. Left ventricular diastolic function  ? could not be evaluated.  ? 2. Right ventricular systolic function is mildly reduced. The right  ?ventricular size is normal.  ? 3. The mitral valve is degenerative. Mild mitral valve regurgitation. No  ?evidence of mitral stenosis. Moderate to severe mitral annular  ?calcification.  ? 4. The aortic valve is tricuspid. There is moderate calcification of the  ?aortic valve. There is moderate thickening of the aortic valve. Aortic  ?valve  regurgitation is not visualized. Mild aortic valve stenosis. Aortic  ?valve area, by VTI measures 2.26  ?cm?Marland Kitchen Aortic valve mean gradient measures 8.0 mmHg. Aortic valve Vmax  ?measures 1.92 m/s.  ? ?Conclusion(s)/Recommendation(s): No evidence of valvular vegetations on   ?this transthoracic echocardiogram. Consider Idonia Zollinger transesophageal  ?echocardiogram to exclude infective endocarditis if clinically indicated. ? ?Burnard Bunting ?Summary:  ?Right: Resting right ankle-brachial index is within normal range. No

## 2022-02-11 NOTE — Progress Notes (Signed)
Subjective: ?3 Days Post-Op Procedure(s) (LRB): ?IRRIGATION AND DEBRIDEMENT WOUND (Right) ?Patient resting comfortably in bed. Wound vac placed and intact without complication. Wound vac and dressings left intact ? ?Objective: ?Vital signs in last 24 hours: ?Temp:  [97.6 ?F (36.4 ?C)-99 ?F (37.2 ?C)] 98.2 ?F (36.8 ?C) (03/28 2035) ?Pulse Rate:  [76-88] 88 (03/28 2035) ?Resp:  [15-18] 16 (03/28 2035) ?BP: (94-147)/(49-69) 132/49 (03/28 2035) ?SpO2:  [95 %-100 %] 100 % (03/28 2035) ?Weight:  [114.9 kg] 114.9 kg (03/28 0500) ? ?Intake/Output from previous day: ?03/27 0701 - 03/28 0700 ?In: 360 [P.O.:360] ?Out: 1300  ?Intake/Output this shift: ?Total I/O ?In: -  ?Out: 2 [Stool:2] ? ?Recent Labs  ?  02/09/22 ?0751 02/09/22 ?1613 02/10/22 ?0421 02/11/22 ?5462  ?HGB 8.4* 8.6* 9.1* 8.4*  ? ?Recent Labs  ?  02/10/22 ?0421 02/11/22 ?7035  ?WBC 15.4* 14.7*  ?RBC 2.91* 2.73*  ?HCT 27.2* 25.8*  ?PLT 436* 441*  ? ?Recent Labs  ?  02/10/22 ?0421 02/11/22 ?0093  ?NA 132* 133*  ?K 4.5 4.3  ?CL 96* 95*  ?CO2 23 25  ?BUN 54* 31*  ?CREATININE 12.50* 9.00*  ?GLUCOSE 174* 140*  ?CALCIUM 8.7* 8.3*  ? ?No results for input(s): LABPT, INR in the last 72 hours. ? ?Wound vac in place without complication.  ? ? ?Assessment/Plan: ?3 Days Post-Op Procedure(s) (LRB): ?IRRIGATION AND DEBRIDEMENT WOUND (Right) DOS: 3/19. 3/25.  ?- Plan for Discharge to SNF ?- Continue wound vac dressing changes 2x/week as per standing order ?- rec abx outpatient as per ID ?- okay to d/c to SNF from a podiatry standpoint ? ?Edrick Kins ?02/11/2022, 10:26 PM ? ?

## 2022-02-11 NOTE — TOC Progression Note (Signed)
Transition of Care (TOC) - Progression Note  ? ? ?Patient Details  ?Name: MACKAY HANAUER Sr. ?MRN: [id number] ?Date of Birth: 02/14/1961 ? ?Transition of Care (TOC) CM/SW Contact  ?Geralynn Ochs, LCSW ?Phone Number: ?02/11/2022, 3:52 PM ? ?Clinical Narrative:   CSW met with patient today to confirm interest in SNF, and discuss placement in [place] vs [place]. Patient confirmed plan for [facility], but asked CSW to discuss with his daughter. CSW spoke with daughter, Valinda Party, about SNF placement and provide bed offers. Daughter to review options available and get back to CSW with choice. Patient's HD center will need to be moved to [place], [place] to notify renal navigator after a SNF choice has been made. CSW to follow. ? ? ? ?Expected Discharge Plan: [place] ?Barriers to Discharge: Continued Medical Work up ? ?Expected Discharge Plan and Services ?Expected Discharge Plan: [place] ?  ?  ?  ?  ?                ?  ?  ?  ?  ?  ?  ?  ?  ?  ?  ? ? ?Social Determinants of Health (SDOH) Interventions ?  ? ?Readmission Risk Interventions ?   ? View : No data to display.  ?  ?  ?  ? ? ?

## 2022-02-11 NOTE — Progress Notes (Signed)
Occupational Therapy Treatment ?Patient Details ?Name: [name] PEIFER Sr. ?MRN: [id number] ?DOB: Oct 28, 1961 ?Today's Date: 02/11/2022 ? ? ?History of present illness 61 y.o. male  presents the ED 02/02/22 with altered mental status. Pt found to have osteomyelitis of R foot, sepsis and acute respiratory failure with hypoxia with acute metabolic encephalopathy. 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 Transferred to ICU after LoC and hypotension systolic in 27P placed on 5L O2 and given Narcan.PMH: diabetes mellitus type 2, CHF, ESRD, hypertension, myocardial infarction, nonischemic cardiomyopathy. s/p Right irrigation and debridement with removal of residual bone at 4-5 metatarsals on 3/25. ?  ?OT comments ? Patient seated on EOB completing treatment with PT after having episode of emesis.  Patient transferred to recliner with assistance of PT and OT.  Patient continued to have complaints of nausea during visit requiring increased time to address self care tasks. Patient agreed to continue in recliner to increase out of bed tolerance. Acute OT to continue to follow.   ? ?Recommendations for follow up therapy are one component of a multi-disciplinary discharge planning process, led by the attending physician.  Recommendations may be updated based on patient status, additional functional criteria and insurance authorization. ?   ?Follow Up Recommendations ? Skilled nursing-short term rehab (<3 hours/day)  ?  ?Assistance Recommended at Discharge Frequent or constant Supervision/Assistance  ?Patient can return home with the following ? Two people to help with walking and/or transfers;Two people to help with bathing/dressing/bathroom ?  ?Equipment Recommendations ?  (TBD pending progress; may need wheelchair pending ability to maintain NWB precautions)  ?  ?Recommendations for Other Services   ? ?  ?Precautions / Restrictions Precautions ?Precautions: Fall ?Precaution Comments: WBAT for transfers only R LE ?Required Braces  or Orthoses: Other Brace ?Other Brace: Post op Shoe ?Restrictions ?Weight Bearing Restrictions: Yes ?RLE Weight Bearing: Non weight bearing  ? ? ?  ? ?Mobility Bed Mobility ?Overal bed mobility: Needs Assistance ?  ?  ?  ?  ?  ?  ?General bed mobility comments: seated on EOB upon entry ?  ? ?Transfers ?Overall transfer level: Needs assistance ?Equipment used: 2 person hand held assist ?Transfers: Bed to chair/wheelchair/BSC ?  ?Stand pivot transfers: +2 physical assistance, Mod assist ?  ?  ?  ?  ?General transfer comment: patient required cueing for safety and assistance for balance stability ?  ?  ?Balance Overall balance assessment: Needs assistance ?Sitting-balance support: No upper extremity supported, Feet supported ?Sitting balance-Leahy Scale: Fair ?Sitting balance - Comments: able to maintain balance on EOB ?  ?Standing balance support: Bilateral upper extremity supported, During functional activity ?Standing balance-Leahy Scale: Poor ?Standing balance comment: Required 2+ HHA for stand pivot trasnfer. ?  ?  ?  ?  ?  ?  ?  ?  ?  ?  ?  ?   ? ?ADL either performed or assessed with clinical judgement  ? ?ADL Overall ADL's : Needs assistance/impaired ?  ?  ?Grooming: Wash/dry hands;Wash/dry face;Oral care;Set up;Sitting ?  ?  ?  ?  ?  ?Upper Body Dressing : Sitting;Moderate assistance ?Upper Body Dressing Details (indicate cue type and reason): changed gown ?Lower Body Dressing: Maximal assistance ?Lower Body Dressing Details (indicate cue type and reason): to donn left sock ?  ?  ?  ?  ?  ?  ?  ?General ADL Comments: Patient with episode of emesis with PT and required a change of gown and sock ?  ? ?Extremity/Trunk Assessment   ?  ?  ?  ?  ?  ? ?  Vision   ?  ?  ?Perception   ?  ?Praxis   ?  ? ?Cognition Arousal/Alertness: Awake/alert ?Behavior During Therapy: Flat affect ?Overall Cognitive Status: Impaired/Different from baseline ?Area of Impairment: Safety/judgement, Awareness, Following commands, Problem  solving ?  ?  ?  ?  ?  ?  ?  ?  ?  ?  ?  ?Following Commands: Follows one step commands consistently, Follows multi-step commands consistently ?Safety/Judgement: Decreased awareness of safety, Decreased awareness of deficits ?Awareness: Intellectual ?Problem Solving: Slow processing, Decreased initiation ?General Comments: not very vocal during treatment ?  ?  ?   ?Exercises   ? ?  ?Shoulder Instructions   ? ? ?  ?General Comments Pt. had large episode of emesis during sitting balance. Assisted in pt. clean up. RN notified and administered Zofran for nausea.  ? ? ?Pertinent Vitals/ Pain       Pain Assessment ?Pain Assessment: Faces ?Faces Pain Scale: Hurts a little bit ?Pain Location: R LE, top of foot ?Pain Descriptors / Indicators: Grimacing ?Pain Intervention(s): Monitored during session, Repositioned ? ?Home Living   ?  ?  ?  ?  ?  ?  ?  ?  ?  ?  ?  ?  ?  ?  ?  ?  ?  ?  ? ?  ?Prior Functioning/Environment    ?  ?  ?  ?   ? ?Frequency ? Min 2X/week  ? ? ? ? ?  ?Progress Toward Goals ? ?OT Goals(current goals can now be found in the care plan section) ? Progress towards OT goals: Progressing toward goals ? ?Acute Rehab OT Goals ?Patient Stated Goal: get better ?OT Goal Formulation: With patient ?Time For Goal Achievement: 02/18/22 ?Potential to Achieve Goals: Good ?ADL Goals ?Pt Will Perform Grooming: with set-up;sitting ?Pt Will Perform Lower Body Bathing: with mod assist;sitting/lateral leans;sit to/from stand ?Pt Will Transfer to Toilet: with mod assist;bedside commode ?Additional ADL Goal #1: Pt to demo ability to follow 2 step commands > 75% of the time  ?Plan Discharge plan remains appropriate   ? ?Co-evaluation ? ? ?   ?  ?  ?  ?  ? ?  ?AM-PAC OT "6 Clicks" Daily Activity     ?Outcome Measure ? ? Help from another person eating meals?: A Little ?Help from another person taking care of personal grooming?: A Little ?Help from another person toileting, which includes using toliet, bedpan, or urinal?:  Total ?Help from another person bathing (including washing, rinsing, drying)?: A Lot ?Help from another person to put on and taking off regular upper body clothing?: A Little ?Help from another person to put on and taking off regular lower body clothing?: A Lot ?6 Click Score: 14 ? ?  ?End of Session Equipment Utilized During Treatment: Gait belt ? ?OT Visit Diagnosis: Unsteadiness on feet (R26.81);Other abnormalities of gait and mobility (R26.89);Muscle weakness (generalized) (M62.81);Other symptoms and signs involving cognitive function ?  ?Activity Tolerance Other (comment) (continued to have nausea through treatment) ?  ?Patient Left in chair;with call bell/phone within reach;with chair alarm set ?  ?Nurse Communication Mobility status ?  ? ?   ? ?Time: 6269-4854 ?OT Time Calculation (min): 31 min ? ?Charges: OT General Charges ?$OT Visit: 1 Visit ?OT Treatments ?$Self Care/Home Management : 23-37 mins ? ?Lodema Hong, OTA ?Acute Rehabilitation Services  ?Pager [phone] ?Office [phone] ? ? ?[place] ?02/11/2022, 2:05 PM ?

## 2022-02-11 NOTE — Progress Notes (Signed)
Physical Therapy Treatment ?Patient Details ?Name: [name] SCHANK Sr. ?MRN: [id number] ?DOB: 05-21-1961 ?Today's Date: 02/11/2022 ? ? ?History of Present Illness 61 y.o. male  presents the ED 02/02/22 with altered mental status. Pt found to have osteomyelitis of R foot, sepsis and acute respiratory failure with hypoxia with acute metabolic encephalopathy. 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 Transferred to ICU after LoC and hypotension systolic in 69C placed on 5L O2 and given Narcan.PMH: diabetes mellitus type 2, CHF, ESRD, hypertension, myocardial infarction, nonischemic cardiomyopathy. s/p Right irrigation and debridement with removal of residual bone at 4-5 metatarsals on 3/25. ? ?  ?PT Comments  ? ? Focus of session today was functional bed mobility, sitting balance, and tranfers. The patient tolerated well but was limited secondary to nausea and an episode of emesis.  Pt. Shows overall improvement with his bed mobility, static, and dynamic sitting balance. Functional strength, balance, and mobility are still limiting function. Pt. Would benefit from skilled PT to continue to address his functional mobility, transfer ability, dynamic and static balance, and gait. Plan and discharge setting remains unchanged. Pt to follow acutely as appropriate.  ?   ?Recommendations for follow up therapy are one component of a multi-disciplinary discharge planning process, led by the attending physician.  Recommendations may be updated based on patient status, additional functional criteria and insurance authorization. ? ?Follow Up Recommendations ? Skilled nursing-short term rehab (<3 hours/day) ?  ?  ?Assistance Recommended at Discharge Frequent or constant Supervision/Assistance  ?Patient can return home with the following Two people to help with walking and/or transfers;Two people to help with bathing/dressing/bathroom;Assistance with cooking/housework;Assistance with feeding;Direct supervision/assist for medications  management;Direct supervision/assist for financial management;Assist for transportation;Help with stairs or ramp for entrance ?  ?Equipment Recommendations ? Rolling walker (2 wheels);BSC/3in1  ?  ?Recommendations for Other Services   ? ? ?  ?Precautions / Restrictions Precautions ?Precautions: Fall ?Precaution Comments: WBAT for transfers only R LE ?Required Braces or Orthoses: Other Brace ?Other Brace: Post op Shoe ?Restrictions ?Weight Bearing Restrictions: Yes ?RLE Weight Bearing: Non weight bearing  ?  ? ?Mobility ? Bed Mobility ?Overal bed mobility: Needs Assistance ?  ?  ?  ?Supine to sit: Min assist ?  ?  ?General bed mobility comments: Min A for trasnfer for sequencing cues and HHA to come to a sit. Pt. able to self steady. ?  ? ?Transfers ?Overall transfer level: Needs assistance ?Equipment used: 2 person hand held assist ?Transfers: Bed to chair/wheelchair/BSC ?  ?Stand pivot transfers: +2 physical assistance, Mod assist ?  ?  ?  ?  ?General transfer comment: Mod A for cuing and directing hip. Pt. requires support to maintain balance and for controled lowering. ?  ? ?Ambulation/Gait ?  ?  ?  ?  ?  ?  ?  ?  ? ? ?Stairs ?  ?  ?  ?  ?  ? ? ?Wheelchair Mobility ?  ? ?Modified Rankin (Stroke Patients Only) ?  ? ? ?  ?Balance Overall balance assessment: Needs assistance ?Sitting-balance support: No upper extremity supported, Feet supported ?Sitting balance-Leahy Scale: Fair ?Sitting balance - Comments: Pt. able to maintain sitting balance for an extended period during clean up. He was able to reach minimally out of BOS to assist. ?  ?Standing balance support: Bilateral upper extremity supported, During functional activity ?Standing balance-Leahy Scale: Poor ?Standing balance comment: Required 2+ HHA for stand pivot trasnfer. ?  ?  ?  ?  ?  ?  ?  ?  ?  ?  ?  ?  ? ?  ?  Cognition Arousal/Alertness: Lethargic ?Behavior During Therapy: Flat affect ?Overall Cognitive Status: Impaired/Different from baseline ?Area of  Impairment: Safety/judgement, Awareness, Following commands, Problem solving ?  ?  ?  ?  ?  ?  ?  ?  ?  ?  ?  ?Following Commands: Follows one step commands consistently, Follows multi-step commands consistently ?Safety/Judgement: Decreased awareness of safety, Decreased awareness of deficits ?Awareness: Intellectual ?Problem Solving: Slow processing, Decreased initiation ?General Comments: Pt. required cuing for transfer and tacticle cues for motion ?  ?  ? ?  ?Exercises   ? ?  ?General Comments General comments (skin integrity, edema, etc.): Pt. had large episode of emesis during sitting balance. Assisted in pt. clean up. RN notified and administered Zofran for nausea. ?  ?  ? ?Pertinent Vitals/Pain Pain Assessment ?Faces Pain Scale: Hurts a little bit ?Pain Location: R LE, top of foot ?Pain Descriptors / Indicators: Grimacing, Guarding, Moaning ?Pain Intervention(s): Limited activity within patient's tolerance, Monitored during session, Repositioned  ? ? ?Home Living   ?  ?  ?  ?  ?  ?  ?  ?  ?  ?   ?  ?Prior Function    ?  ?  ?   ? ?PT Goals (current goals can now be found in the care plan section) Acute Rehab PT Goals ?PT Goal Formulation: With family ?Time For Goal Achievement: 02/17/22 ?Potential to Achieve Goals: Good ?Progress towards PT goals: Progressing toward goals ? ?  ?Frequency ? ? ? Min 2X/week ? ? ? ?  ?PT Plan Current plan remains appropriate  ? ? ?Co-evaluation   ?  ?  ?  ?  ? ?  ?AM-PAC PT "6 Clicks" Mobility   ?Outcome Measure ? Help needed turning from your back to your side while in a flat bed without using bedrails?: A Little ?Help needed moving from lying on your back to sitting on the side of a flat bed without using bedrails?: A Lot ?Help needed moving to and from a bed to a chair (including a wheelchair)?: A Lot ?Help needed standing up from a chair using your arms (e.g., wheelchair or bedside chair)?: A Lot ?Help needed to walk in hospital room?: Total ?Help needed climbing 3-5 steps  with a railing? : Total ?6 Click Score: 11 ? ?  ?End of Session   ?Activity Tolerance: Patient tolerated treatment well;Other (comment) (Pt. limited by nausea) ?Patient left: in chair;Other (comment) (With OT handoff) ?Nurse Communication: Mobility status ?PT Visit Diagnosis: Muscle weakness (generalized) (M62.81);Difficulty in walking, not elsewhere classified (R26.2) ?  ? ? ?Time: 9675-9163 ?PT Time Calculation (min) (ACUTE ONLY): 33 min ? ?Charges:  $Therapeutic Activity: 8-22 mins ?$Self Care/Home Management: 8-22          ?          ? ?Thermon Leyland, SPT ?Acute Rehab Services ? ? ? ?Thermon Leyland ?02/11/2022, 1:50 PM ? ?

## 2022-02-12 DIAGNOSIS — E875 Hyperkalemia: Secondary | ICD-10-CM

## 2022-02-12 DIAGNOSIS — L03119 Cellulitis of unspecified part of limb: Secondary | ICD-10-CM

## 2022-02-12 DIAGNOSIS — M869 Osteomyelitis, unspecified: Secondary | ICD-10-CM | POA: Diagnosis not present

## 2022-02-12 DIAGNOSIS — E441 Mild protein-calorie malnutrition: Secondary | ICD-10-CM

## 2022-02-12 DIAGNOSIS — R112 Nausea with vomiting, unspecified: Secondary | ICD-10-CM

## 2022-02-12 DIAGNOSIS — J9601 Acute respiratory failure with hypoxia: Secondary | ICD-10-CM | POA: Diagnosis not present

## 2022-02-12 DIAGNOSIS — G9341 Metabolic encephalopathy: Secondary | ICD-10-CM | POA: Diagnosis not present

## 2022-02-12 DIAGNOSIS — N186 End stage renal disease: Secondary | ICD-10-CM | POA: Diagnosis not present

## 2022-02-12 LAB — CBC WITH DIFFERENTIAL/PLATELET
Abs Immature Granulocytes: 0.36 10*3/uL — ABNORMAL HIGH (ref 0.00–0.07)
Basophils Absolute: 0.1 10*3/uL (ref 0.0–0.1)
Basophils Relative: 0 %
Eosinophils Absolute: 0.4 10*3/uL (ref 0.0–0.5)
Eosinophils Relative: 3 %
HCT: 25.7 % — ABNORMAL LOW (ref 39.0–52.0)
Hemoglobin: 8.4 g/dL — ABNORMAL LOW (ref 13.0–17.0)
Immature Granulocytes: 3 %
Lymphocytes Relative: 11 %
Lymphs Abs: 1.6 10*3/uL (ref 0.7–4.0)
MCH: 31.2 pg (ref 26.0–34.0)
MCHC: 32.7 g/dL (ref 30.0–36.0)
MCV: 95.5 fL (ref 80.0–100.0)
Monocytes Absolute: 1.5 10*3/uL — ABNORMAL HIGH (ref 0.1–1.0)
Monocytes Relative: 10 %
Neutro Abs: 10.3 10*3/uL — ABNORMAL HIGH (ref 1.7–7.7)
Neutrophils Relative %: 73 %
Platelets: 464 10*3/uL — ABNORMAL HIGH (ref 150–400)
RBC: 2.69 MIL/uL — ABNORMAL LOW (ref 4.22–5.81)
RDW: 17.8 % — ABNORMAL HIGH (ref 11.5–15.5)
WBC: 14.1 10*3/uL — ABNORMAL HIGH (ref 4.0–10.5)
nRBC: 0 % (ref 0.0–0.2)

## 2022-02-12 LAB — GLUCOSE, CAPILLARY
Glucose-Capillary: 88 mg/dL (ref 70–99)
Glucose-Capillary: 122 mg/dL — ABNORMAL HIGH (ref 70–99)
Glucose-Capillary: 214 mg/dL — ABNORMAL HIGH (ref 70–99)
Glucose-Capillary: 227 mg/dL — ABNORMAL HIGH (ref 70–99)
Glucose-Capillary: 72 mg/dL (ref 70–99)
Glucose-Capillary: 80 mg/dL (ref 70–99)
Glucose-Capillary: 87 mg/dL (ref 70–99)
Glucose-Capillary: 229 mg/dL — ABNORMAL HIGH (ref 70–99)

## 2022-02-12 LAB — RENAL FUNCTION PANEL
Albumin: 2.2 g/dL — ABNORMAL LOW (ref 3.5–5.0)
Anion gap: 13 (ref 5–15)
BUN: 40 mg/dL — ABNORMAL HIGH (ref 6–20)
CO2: 26 mmol/L (ref 22–32)
Calcium: 8.7 mg/dL — ABNORMAL LOW (ref 8.9–10.3)
Chloride: 95 mmol/L — ABNORMAL LOW (ref 98–111)
Creatinine, Ser: 11.54 mg/dL — ABNORMAL HIGH (ref 0.61–1.24)
GFR, Estimated: 5 mL/min — ABNORMAL LOW (ref >60)
Glucose, Bld: 126 mg/dL — ABNORMAL HIGH (ref 70–99)
Phosphorus: 5.9 mg/dL — ABNORMAL HIGH (ref 2.5–4.6)
Potassium: 4.1 mmol/L (ref 3.5–5.1)
Sodium: 134 mmol/L — ABNORMAL LOW (ref 135–145)

## 2022-02-12 LAB — CULTURE, BLOOD (ROUTINE X 2)
Culture: NO GROWTH
Culture: NO GROWTH

## 2022-02-12 LAB — SURGICAL PATHOLOGY

## 2022-02-12 MED ORDER — ACETAMINOPHEN 325 MG PO TABS
650.0000 mg | ORAL_TABLET | Freq: Four times a day (QID) | ORAL | Status: DC | PRN
  Administered 2022-02-12 – 2022-02-19 (×4): 650 mg via ORAL
  Filled 2022-02-12 (×3): qty 2

## 2022-02-12 MED ORDER — SORBITOL 70 % SOLN
960.0000 mL | TOPICAL_OIL | Freq: Once | ORAL | Status: DC
  Filled 2022-02-12: qty 473

## 2022-02-12 NOTE — Assessment & Plan Note (Addendum)
-  See above Osteomyelitis/Sepsis ?

## 2022-02-12 NOTE — Consult Note (Signed)
[facility] Nurse Consult Note: ?Pt is followed by the podiatry service for post-op wound to right foot.  Vac dressing change to right outer foot with full thickness post-op wound; beefy red with exposed tendons, small amt bloody drainage, changed the cannister.  Pt was medicated for pain prior to the procedure and tolerated with minimal amt discomfort.  ?Dressing procedure/placement/frequency: Applied Mepitel over the wound bed to protect the exposed tendons, then 2 barrier rings around the wound edges to attempt to maintain a seal, then one piece black foam to 116mm cont suction.  Sentinel team will change again on Fri if patient is still in the hospital at that time.  ?Julien Girt MSN, RN, [place], [place], CNS ?[phone]  ?  ?

## 2022-02-12 NOTE — Progress Notes (Signed)
SLP Cancellation Note ? ?Patient Details ?Name: [name] COVAULT Sr. ?MRN: [id number] ?DOB: Nov 29, 1960 ? ? ?Cancelled treatment:       Reason Eval/Treat Not Completed: Patient at procedure or test/unavailable ? ? ?Murad Staples, Katherene Ponto ?02/12/2022, 2:40 PM ?

## 2022-02-12 NOTE — Assessment & Plan Note (Addendum)
-  On hemodialysis. ?-Potassium of 4.1 today. ?-Continue to Monitor and Trend ?-Repeat CMP in the AM  ?

## 2022-02-12 NOTE — Plan of Care (Signed)
?  Problem: Education: ?Goal: Knowledge of General Education information will improve ?Description: Including pain rating scale, medication(s)/side effects and non-pharmacologic comfort measures ?Outcome: Progressing ?  ?Problem: Health Behavior/Discharge Planning: ?Goal: Ability to manage health-related needs will improve ?Outcome: Progressing ?  ?Problem: Clinical Measurements: ?Goal: Ability to maintain clinical measurements within normal limits will improve ?Outcome: Progressing ?Goal: Will remain free from infection ?Outcome: Progressing ?Goal: Diagnostic test results will improve ?Outcome: Progressing ?Goal: Respiratory complications will improve ?Outcome: Progressing ?Goal: Cardiovascular complication will be avoided ?Outcome: Progressing ?  ?Problem: Activity: ?Goal: Risk for activity intolerance will decrease ?Outcome: Progressing ?  ?Problem: Nutrition: ?Goal: Adequate nutrition will be maintained ?Outcome: Progressing ?  ?Problem: Coping: ?Goal: Level of anxiety will decrease ?Outcome: Progressing ?  ?Problem: Elimination: ?Goal: Will not experience complications related to bowel motility ?Outcome: Progressing ?Goal: Will not experience complications related to urinary retention ?Outcome: Progressing ?  ?Problem: Pain Managment: ?Goal: General experience of comfort will improve ?Outcome: Progressing ?  ?Problem: Safety: ?Goal: Ability to remain free from injury will improve ?Outcome: Progressing ?  ?Problem: Skin Integrity: ?Goal: Risk for impaired skin integrity will decrease ?Outcome: Progressing ?  ?Problem: Education: ?Goal: Knowledge of disease or condition will improve ?Outcome: Progressing ?Goal: Knowledge of secondary prevention will improve (SELECT ALL) ?Outcome: Progressing ?Goal: Knowledge of patient specific risk factors will improve (INDIVIDUALIZE FOR PATIENT) ?Outcome: Progressing ?  ?Problem: Coping: ?Goal: Will verbalize positive feelings about self ?Outcome: Progressing ?Goal: Will  identify appropriate support needs ?Outcome: Progressing ?  ?Problem: Health Behavior/Discharge Planning: ?Goal: Ability to manage health-related needs will improve ?Outcome: Progressing ?  ?Problem: Self-Care: ?Goal: Ability to participate in self-care as condition permits will improve ?Outcome: Progressing ?Goal: Verbalization of feelings and concerns over difficulty with self-care will improve ?Outcome: Progressing ?Goal: Ability to communicate needs accurately will improve ?Outcome: Progressing ?  ?Problem: Nutrition: ?Goal: Risk of aspiration will decrease ?Outcome: Progressing ?Goal: Dietary intake will improve ?Outcome: Progressing ?  ?Problem: Ischemic Stroke/TIA Tissue Perfusion: ?Goal: Complications of ischemic stroke/TIA will be minimized ?Outcome: Progressing ?  ?Problem: Education: ?Goal: Knowledge of disease or condition will improve ?Outcome: Progressing ?Goal: Knowledge of secondary prevention will improve (SELECT ALL) ?Outcome: Progressing ?Goal: Individualized Educational Video(s) ?Outcome: Progressing ?  ?Problem: Coping: ?Goal: Will verbalize positive feelings about self ?Outcome: Progressing ?Goal: Will identify appropriate support needs ?Outcome: Progressing ?  ?Problem: Health Behavior/Discharge Planning: ?Goal: Ability to manage health-related needs will improve ?Outcome: Progressing ?  ?Problem: Self-Care: ?Goal: Ability to participate in self-care as condition permits will improve ?Outcome: Progressing ?Goal: Verbalization of feelings and concerns over difficulty with self-care will improve ?Outcome: Progressing ?Goal: Ability to communicate needs accurately will improve ?Outcome: Progressing ?  ?Problem: Nutrition: ?Goal: Risk of aspiration will decrease ?Outcome: Progressing ?Goal: Dietary intake will improve ?Outcome: Progressing ?  ?Problem: Education: ?Goal: Knowledge of secondary prevention will improve (SELECT ALL) ?Outcome: Progressing ?  ?

## 2022-02-12 NOTE — Assessment & Plan Note (Addendum)
-  Hemoglobin/Hct went from 8.9/28.6 -> 8.6/26.9 ?-Continue to Monitor for S/Sx of Bleeding ?-No overt bleeding noted ?-Repeat CBC in the AM  ?

## 2022-02-12 NOTE — Progress Notes (Signed)
OT Cancellation Note ? ?Patient Details ?Name: [name] LANNI Sr. ?MRN: [id number] ?DOB: Feb 21, 1961 ? ? ?Cancelled Treatment:    Reason Eval/Treat Not Completed: Patient at procedure or test/ unavailable (Patient in HD) ?Lodema Hong, OTA ?Acute Rehabilitation Services  ?Pager [phone] ?Office [phone] ? ?[place] ?02/12/2022, 11:54 AM ?

## 2022-02-12 NOTE — Progress Notes (Signed)
?[facility] ?Progress Note  ? ?Subjective:  ?No c/o ?Seen in room ? ? ?Objective ?Vitals:  ? 02/12/22 0325 02/12/22 0600 02/12/22 0750 02/12/22 0821  ?BP: (!) 129/54  (!) 96/53 127/67  ?Pulse: 83  81 81  ?Resp: 16  18 18   ?Temp: 98.1 ?F (36.7 ?C)  98.7 ?F (37.1 ?C) 98.9 ?F (37.2 ?C)  ?TempSrc:    Oral  ?SpO2: 96%  95% 99%  ?Weight:  120.7 kg    ?Height:      ? ?Physical Exam ?General: Chronically ill appearing male in NAD ?Neuro: nonfocal, cn2-12 grossly intact ?Heart: S1,S2 No M/R/G ?Lungs: CTAB, no WOB ?Abdomen: Obese, NT, ND ?Extremities:no LE edema; wound vac on foot ?Dialysis Access: L AVF +T/B. ? ?Dialysis Orders:  MWF [facility] ?4h 36min  450/500    117.8kg   2/ 2.5Ca bath  Hep 6000  L AVF ? - on 3/19 > hep B Ag neg, and Hep B Ab's high/ protective ? - calcitriol 1.59mcg PO qHD-last 01/31/22 ?  ?Assessment/Plan: ?R foot osteomyelitis- on IV vanc/cefepime/Flagyl. + Morganella bacteremia 2/2 foot abscess. S/p R foot I&D on 3/19 by podiatry and another I&D done 3/25 now with wound vac. Will need 6 weeks ABX for attempted limb salvage from 3/25.  -- Vanc/cefepime + PO Flagyl ?Acute metabolic encephalopathy: Improved. Per primary.  ?CVA: Neurology consulted. MRI done 02/04/2022 Numerous bilat punctate acute infarctions. Per primary. Avoid hypotension ?ESRD - on HD MWF. Cont on schedule: 2K, no heparin, AVF, 450/800, 2.5 Ca, keep SBP > 120 ?Hypertension/volume  - Blood pressures acceptable; euvolemic on exam. UF as tolerated. Avoid hypotension during HD. Keep SBP >120.  ?Anemia of CKD - Hb drifting down, ESA darbe 100 next Tx cont qWed, check Fe but would likely not give in setting of #1 ?Secondary Hyperparathyroidism -  CCa okay. Continue VDRA with HD. Phos stable ?HD access: L AVF w/ thinning, ulcerated area at the lower portion. Stick away from area. VVS consulted and went for fistulogram on 3/23 by [facility] > they did L central venoplasty to innominate and subclavian veins, and peripheral venoplasty to the  cephalic vein.  ? ?Rexene Agent, MD  ?02/12/2022, 10:25 AM ? ? ? ? ?Recent Labs  ?Lab 02/11/22 ?6283 02/12/22 ?1517  ?HGB 8.4* 8.4*  ?ALBUMIN 2.3* 2.2*  ?CALCIUM 8.3* 8.7*  ?PHOS 4.2 5.9*  ?CREATININE 9.00* 11.54*  ?K 4.3 4.1  ? ? ? ? ?Medications: ? ceFEPime (MAXIPIME) IV 2 g (02/10/22 2024)  ? sodium chloride    ? vancomycin 1,000 mg (02/10/22 1501)  ? ? (feeding supplement) PROSource Plus  30 mL Oral BID BM  ? vitamin C  500 mg Oral Daily  ? aspirin EC  81 mg Oral Daily  ? Chlorhexidine Gluconate Cloth  6 each Topical Q0600  ? darbepoetin (ARANESP) injection - DIALYSIS  100 mcg Intravenous Q Wed-HD  ? famotidine  20 mg Oral Daily  ? feeding supplement (NEPRO CARB STEADY)  237 mL Oral BID BM  ? heparin  5,000 Units Subcutaneous Q8H  ? insulin aspart  0-15 Units Subcutaneous TID WC  ? insulin aspart  0-5 Units Subcutaneous QHS  ? insulin glargine-yfgn  20 Units Subcutaneous Daily  ? metroNIDAZOLE  500 mg Oral Q12H  ? midodrine  10 mg Oral Q M,W,F-HD  ? multivitamin  1 tablet Oral QHS  ? pantoprazole  40 mg Oral Q1200  ? sucroferric oxyhydroxide  1,000 mg Oral TID WC  ? thiamine injection  100  mg Intravenous Daily  ? Or  ? thiamine  100 mg Oral Daily  ? zinc sulfate  220 mg Oral BID  ? ? ? ? ?  ? ?

## 2022-02-12 NOTE — Progress Notes (Signed)
Patient is off unit during mid-day neuro assessment.  ?

## 2022-02-12 NOTE — Care Management Important Message (Signed)
Important Message ? ?Patient Details  ?Name: [name] BUSIC Sr. ?MRN: [id number] ?Date of Birth: 1960-12-19 ? ? ?Medicare Important Message Given:  Yes ? ? ? ? ?[name] ?02/12/2022, 12:24 PM ?

## 2022-02-12 NOTE — Progress Notes (Signed)
Patient refused to take all of Nephroro. Patient consumed 250mg  of the 1000mg   dose. Patient was educated and MD was notified. ?

## 2022-02-12 NOTE — Progress Notes (Signed)
?PROGRESS NOTE ? ? ? ?EDIEL UNANGST Sr.  QHU:[id number] DOB: 24-Sep-1961 DOA: 02/02/2022 ?PCP: [facility]  ? ? ?Chief Complaint  ?Patient presents with  ? Altered Mental Status  ? ? ?Brief Narrative:  ?61 y/o male history of DM, ESRD on HD, admitted to the hospital with fever, acute encephalopathy, sepsis and right foot infection.  He has a history of prior right fifth metatarsal amputation performed many years ago in [place].  Plain films of his foot indicate swelling and subcutaneous gas without any obvious bony erosion. [facility] to [facility] and [facility] to [facility].  Podiatry consulted, he was taken emergently to the [facility] on 3/19.  Postoperatively he has had persistent lethargy and was taken to the ICU 3/19 evening.  He was placed on Narcan drip for presumed lethargy due to opioid use however he only got 5 mg of oxycodone.  Narcan drip was discontinued on 3/20 and he was transferred out of the ICU. He continued to have altered mental status and was found to have findings consistent with acute strokes.  Neurology was consulted and suspects related to hypotension.  His mental status has gradually improved.  Blood cultures were positive with morganella and clostridium related to his foot infection.  ID has been consulted and recommended 6 weeks of abx after last debridement.  He went back to OR on 3/25 for I&D with podiatry.  Hospitalization c/b malfunctioning L AV fistula, he's s/p fistulogram with vascular on 3/23.  At this time, plan is for d/c to SNF when bed available.  He had nausea/vomiting of unclear etiology 3/28, workup pending. ? ?See below for additional details ?   ? ? ?Assessment & Plan: ? Principal Problem: ?  Osteomyelitis ([place]) ?Active Problems: ?  Severe sepsis ([place]) ?  Nausea & vomiting ?  Hypotension ?  Acute metabolic encephalopathy ?  Stroke [facility]) ?  ESRD (end stage renal disease) ([place]) ?  DM2 (diabetes mellitus, type 2) ([place]) ?  HTN  (hypertension) ?  Chronic systolic CHF (congestive heart failure) ([place]) ?  Acute respiratory failure with hypoxia ([place]) ?  Protein-calorie malnutrition, mild ([place]) ?  GERD (gastroesophageal reflux disease) ?  Carotid stenosis ?  Hyponatremia ?  Hyperkalemia ?  High anion gap metabolic acidosis ?  Anemia of chronic renal failure ?  Necrotizing soft tissue infection ?  Cellulitis in diabetic foot ([place]) ?  Bacteremia ?  Right foot infection ? ? ? 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 ?Appreciate ID assistance, continuing vanc, flagyl, and cefepime - low suspicion for endocarditis per ID, TTE without eivdence of valvular vegetations on TTE (per ID, "do not feel strongly regarding getting TEE from ID standpoint given low grade bacteremia with GNR/GPR with right  foot being source, would defer need of TEE to neurology if helpful for stroke workup - neurology did not recommend from their standpoint).  Recommending 6 weeks of abx after last debridement intervention (from 3/25). ? ? ?Hypotension ?Post op, likely multifactorial, blood loss, meds ?He was weaned off pressors after small  bolus post op 3/25 ?Will continue to trend Hb/hct ?Planning for midodrine 10 mg pre HD ? ?Nausea & vomiting ?Unclear etiology ?Likely secondary to constipation/fecal impaction. ?KUB with stool in rectosigmoid concerning for constipation/fecal impaction ?Disimpaction/enema ordered with results. ?-Patient denies any further nausea or vomiting. ?-Currently on full liquid diet which was advanced to a soft diet. ?-We will give another enema x1. ? ?Stroke [facility]) ?MRI brain 3/22 with numerous punctate acute infarctions throughout both cerebral hemispheres, primarily affecting the deep brain ?Echo with Ef 45-50%, global hypokinesis, RVSF mildly reduced ?CTA neck 3/20 without LVO or hemodynamically significant stenosis of intracranial arteries, did have 70% stenosis for R ICA due to mixed density atherosclerosis ?CT venogram normal ?LDL 46, A1c 12.2 ?Appreciate neurology recommendations - suspect likely 2/2 hypoperfusion with intermittent hypotension.  Aspirin 81 mg daily.  No statin at this time, LDL at goal. ?Will need outpatient follow-up with neurology ? ? ?Acute metabolic encephalopathy ?Related to stroke as well as infection and acute hospitalization ?Clinically improving. ?Follow EEG - mild diffuse encephalopathy ?Appreciate neurology, concern for cytotoxic lesion of corpus callosum on MRI, likely related to metabolic derangement -> hyperglycemia, uremia, sepsis, etc ?Delirium precautions, will treat underlying cause. ? ? ?ESRD (end stage renal disease) ([place]) ?Per renal ?Being evaluated outpatient for possible transplant ?S/p L arm fistulogram with vascular 3/23 -> L central angioplasty innominate and subclavian vein, L peripheral angioplasty cephalic vein ?-Patient in hemodialysis today ?-Per nephrology. ? ?DM2 (diabetes mellitus, type 2) ([place]) ?Hemoglobimn a1c 12.2 (02/02/2022). ?CBG 88 this morning. ?Continue Semglee 20 units daily, SSI. ? ? ?HTN (hypertension) ?Intermittent hypotension, concern that this may have  resulted in watershed infarcts. ?Continue to hold antihypertensive medications. ? ?Acute respiratory failure with hypoxia ([place]) ?Currently has been weaned to ra, continue to monitor ?CXR 3/20 without frank edema ? ?Chronic systolic CHF (congestive heart failure) ([place]) ?EF 45% per echo 05/2021 ?Echo 3/22 with EF 45-50% ?Volume per renal ? ?Carotid stenosis ?-Outpatient vascular follow up ? ?GERD (gastroesophageal reflux disease) ?- PPI. ? ?Protein-calorie malnutrition, mild ([place]) ?- Nutritional supplementation. ? ?Necrotizing soft tissue infection ?- See above osteomyelitis/sepsis ? ?Anemia of chronic renal failure ?- H&H stable. ? ?Hyperkalemia ?- On hemodialysis. ? ? ? ? ?  ? ? ?DVT prophylaxis: Heparin ?Code Status: Full ?Family Communication: Updated patient.  No family at bedside. ?Disposition: TBD ? ?Status is: Inpatient ?Remains inpatient appropriate because: Severity of illness. ?  ?Consultants:  ?Podiatry: Dr. Blenda Mounts 02/02/2022 ?Nephrology: Dr. Joylene Grapes 02/02/2022 ?PCCM: Dr.Ismail 02/03/2022 ?Infectious disease: Dr.Manandhar 02/03/2022 ?Neurology: Dr.Collins 02/04/2022 ?Vascular surgery: Dr.Brabham 02/05/2022 ? ?Procedures:  ?CT head 02/02/2022 ?CT angiogram head and neck 02/03/2022 ?Plain films of the right foot 02/02/2022, 02/08/2022 ?Chest x-ray 02/03/2022 ?Abdominal films 02/04/2022, 02/11/2022 ?MRI right foot/MRI right ankle 02/06/2022 ?2D echo 02/05/2022 ?MRI brain 02/04/2022 ?Left brachiocephalic AV fistula cannulation under ultrasound guidance/lifestyle fistulogram including central venogram/left central angioplasty innominate and subclavian vein/left peripheral angioplasty cephalic vein per Dr. Carlis Abbott vascular surgeon 02/06/2022 ?CT venogram head 02/03/2022 ? ?Antimicrobials:  ?Anti-infectives (From admission, onward)  ? ? Start     Dose/Rate Route Frequency Ordered Stop  ? 02/03/22 2200  metroNIDAZOLE (FLAGYL) tablet 500 mg       ?  500 mg Oral Every 12 hours 02/03/22 1108 03/22/22 2359  ? 02/03/22 2000  ceFEPIme  (MAXIPIME) 2 g in sodium chloride 0.9 % 100 mL IVPB       ? 2 g ?200 mL/hr over 30 Minutes Intravenous Every M-W-F (2000) 02/02/22 0459 03/22/22 2359  ? 02/03/22 1200  vancomycin (VANCOCIN) IVPB 1000 mg/200 mL premix

## 2022-02-12 NOTE — Assessment & Plan Note (Addendum)
-  Nutrition Status: ?Nutrition Problem: Increased nutrient needs ?Etiology: wound healing, chronic illness (CHF, ESRD) ?Signs/Symptoms: estimated needs ?Interventions: MVI, Nepro shake, Other (Comment) (zinc and vitamin C) ? ? ?

## 2022-02-13 DIAGNOSIS — M869 Osteomyelitis, unspecified: Secondary | ICD-10-CM | POA: Diagnosis not present

## 2022-02-13 DIAGNOSIS — G9341 Metabolic encephalopathy: Secondary | ICD-10-CM | POA: Diagnosis not present

## 2022-02-13 DIAGNOSIS — N186 End stage renal disease: Secondary | ICD-10-CM | POA: Diagnosis not present

## 2022-02-13 DIAGNOSIS — J9601 Acute respiratory failure with hypoxia: Secondary | ICD-10-CM | POA: Diagnosis not present

## 2022-02-13 LAB — CBC WITH DIFFERENTIAL/PLATELET
Abs Immature Granulocytes: 0.15 10*3/uL — ABNORMAL HIGH (ref 0.00–0.07)
Basophils Absolute: 0.1 10*3/uL (ref 0.0–0.1)
Basophils Relative: 1 %
Eosinophils Absolute: 0.4 10*3/uL (ref 0.0–0.5)
Eosinophils Relative: 3 %
HCT: 27.4 % — ABNORMAL LOW (ref 39.0–52.0)
Hemoglobin: 8.9 g/dL — ABNORMAL LOW (ref 13.0–17.0)
Immature Granulocytes: 1 %
Lymphocytes Relative: 14 %
Lymphs Abs: 1.7 10*3/uL (ref 0.7–4.0)
MCH: 31.2 pg (ref 26.0–34.0)
MCHC: 32.5 g/dL (ref 30.0–36.0)
MCV: 96.1 fL (ref 80.0–100.0)
Monocytes Absolute: 1.5 10*3/uL — ABNORMAL HIGH (ref 0.1–1.0)
Monocytes Relative: 13 %
Neutro Abs: 8.1 10*3/uL — ABNORMAL HIGH (ref 1.7–7.7)
Neutrophils Relative %: 68 %
Platelets: 428 10*3/uL — ABNORMAL HIGH (ref 150–400)
RBC: 2.85 MIL/uL — ABNORMAL LOW (ref 4.22–5.81)
RDW: 17.8 % — ABNORMAL HIGH (ref 11.5–15.5)
WBC: 11.9 10*3/uL — ABNORMAL HIGH (ref 4.0–10.5)
nRBC: 0.2 % (ref 0.0–0.2)

## 2022-02-13 LAB — RENAL FUNCTION PANEL
Albumin: 2.2 g/dL — ABNORMAL LOW (ref 3.5–5.0)
Anion gap: 12 (ref 5–15)
BUN: 22 mg/dL — ABNORMAL HIGH (ref 6–20)
CO2: 26 mmol/L (ref 22–32)
Calcium: 8.3 mg/dL — ABNORMAL LOW (ref 8.9–10.3)
Chloride: 96 mmol/L — ABNORMAL LOW (ref 98–111)
Creatinine, Ser: 8.31 mg/dL — ABNORMAL HIGH (ref 0.61–1.24)
GFR, Estimated: 7 mL/min — ABNORMAL LOW (ref >60)
Glucose, Bld: 227 mg/dL — ABNORMAL HIGH (ref 70–99)
Phosphorus: 4 mg/dL (ref 2.5–4.6)
Potassium: 4.3 mmol/L (ref 3.5–5.1)
Sodium: 134 mmol/L — ABNORMAL LOW (ref 135–145)

## 2022-02-13 LAB — GLUCOSE, CAPILLARY
Glucose-Capillary: 222 mg/dL — ABNORMAL HIGH (ref 70–99)
Glucose-Capillary: 188 mg/dL — ABNORMAL HIGH (ref 70–99)
Glucose-Capillary: 216 mg/dL — ABNORMAL HIGH (ref 70–99)
Glucose-Capillary: 182 mg/dL — ABNORMAL HIGH (ref 70–99)

## 2022-02-13 MED ORDER — SENNOSIDES-DOCUSATE SODIUM 8.6-50 MG PO TABS
1.0000 | ORAL_TABLET | Freq: Two times a day (BID) | ORAL | Status: DC
Start: 2022-02-13 — End: 2022-02-20
  Administered 2022-02-13 – 2022-02-20 (×10): 1 via ORAL
  Filled 2022-02-13 (×12): qty 1

## 2022-02-13 MED ORDER — POLYETHYLENE GLYCOL 3350 17 G PO PACK
17.0000 g | PACK | Freq: Two times a day (BID) | ORAL | Status: DC
  Administered 2022-02-17 – 2022-02-20 (×4): 17 g via ORAL
  Filled 2022-02-13 (×8): qty 1

## 2022-02-13 NOTE — Progress Notes (Signed)
Speech Language Pathology Treatment: Cognitive-Linquistic  ?Patient Details ?Name: [name] [name]. ?MRN: [id number] ?DOB: 05-27-1961 ?Today's Date: 02/13/2022 ?Time: 3329-5188 ?SLP Time Calculation (min) (ACUTE ONLY): 23 min ? ?Assessment / Plan / Recommendation ?Clinical Impression ? Pt seen at bedside for skilled SLP services targeting cognitive-linguistic deficits in functional manner. Pt initially tasked with orientation questions, and performed with 75% accuracy. Pt verbalized that the year was "3033." After SLP wrote entire date out, pt was able to read it and correctly express the date. SLP used delayed recall of date task and pt improved accuracy from 75% to 100% independently. SLP then incorporated functional money task to the pt. Pt was able to sort money and coins with 100% accuracy; engaged in self-monitoring/correction strategies while sorting money. SLP then tasked pt with adding up money, and pt demonstrated increased difficulty. Pt required mod assist in adding up total bills and coins. SLP had to use counting individual bills/coins in order for the pt to accurately produce correct total. Pt could not add 62+1 independently. Incorporating phonemic /th/ cue and counting aided pt in producing "63." Pt primarily has difficulty with number tasks, mental math and functional problem solving. SLP to continue to follow acutely. ?  ?HPI HPI: Patient is 61 year old African-American male past medical history of end-stage renal disease hemodialysis chronic systolic heart failure type 2 diabetes admitted to [facility] 2 days ago with acute encephalopathy secondary to infection osteomyelitis in the right foot.  He was taken to the OR with debridement he was started on vancomycin cefepime Flagyl and overnight he started to have low blood pressure on the altered mental status minimal response to Narcan. Pt had been given pain medication. Swallow eval on 3/20 recommended regular solids and thin liquids with  assistance needed due to cognitive impact on swallowing. MRI 3/21 revealed numerous punctate acute infarctions scattered throughout both cerebral hemispheres. ?  ?   ?SLP Plan ? Continue with current plan of care ? ?  ?  ?Recommendations for follow up therapy are one component of a multi-disciplinary discharge planning process, led by the attending physician.  Recommendations may be updated based on patient status, additional functional criteria and insurance authorization. ?  ? ?Recommendations  ?   ?   ?    ?   ? ? ? ? Oral Care Recommendations: Oral care BID ?Follow Up Recommendations: Skilled nursing-short term rehab (<3 hours/day) ?Assistance recommended at discharge: Intermittent Supervision/Assistance ?SLP Visit Diagnosis: Cognitive communication deficit (R41.841) ?Plan: Continue with current plan of care ? ? ? ? ?  ?  ? ? ?Vaughan Sine ? ?02/13/2022, 11:19 AM ?

## 2022-02-13 NOTE — Progress Notes (Signed)
?PROGRESS NOTE ? ? ? ?[name] [name] Sr.  RAQ:[id number] DOB: [birth year] DOA: 02/02/2022 ?PCP: [facility]  ? ? ?Chief Complaint  ?Patient presents with  ? Altered Mental Status  ? ? ?Brief Narrative:  ?61 y/o male history of DM, ESRD on HD, admitted to the hospital with fever, acute encephalopathy, sepsis and right foot infection.  He has a history of prior right fifth metatarsal amputation performed many years ago in [place].  Plain films of his foot indicate swelling and subcutaneous gas without any obvious bony erosion. [facility] presented [facility] and [facility] transferred to [facility].  Podiatry consulted, he was taken emergently to the [facility] on 3/19.  Postoperatively he has had persistent lethargy and was taken to the ICU 3/19 evening.  He was placed on Narcan drip for presumed lethargy due to opioid use however he only got 5 mg of oxycodone.  Narcan drip was discontinued on 3/20 and he was transferred out of the ICU. He continued to have altered mental status and was found to have findings consistent with acute strokes.  Neurology was consulted and suspects related to hypotension.  His mental status has gradually improved.  Blood cultures were positive with morganella and clostridium related to his foot infection.  ID has been consulted and recommended 6 weeks of abx after last debridement.  He went back to OR on 3/25 for I&D with podiatry.  Hospitalization c/b malfunctioning L AV fistula, he's s/p fistulogram with vascular on 3/23.  At this time, plan is for d/c to SNF when bed available.  He had nausea/vomiting of unclear etiology 3/28, workup pending. ? ?See below for additional details ?   ? ? ?Assessment & Plan: ? Principal Problem: ?  Osteomyelitis ([place]) ?Active Problems: ?  Severe sepsis ([place]) ?  Nausea & vomiting ?  Hypotension ?  Acute metabolic encephalopathy ?  Stroke [facility]) ?  ESRD (end stage renal disease) ([place]) ?  DM2 (diabetes mellitus, type 2) ([place]) ?  HTN  (hypertension) ?  Chronic systolic CHF (congestive heart failure) ([place]) ?  Acute respiratory failure with hypoxia ([place]) ?  Protein-calorie malnutrition, mild ([place]) ?  GERD (gastroesophageal reflux disease) ?  Carotid stenosis ?  Hyponatremia ?  Hyperkalemia ?  High anion gap metabolic acidosis ?  Anemia of chronic renal failure ?  Necrotizing soft tissue infection ?  Cellulitis in diabetic foot ([place]) ?  Bacteremia ?  Right foot infection ? ? ? 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 ?Appreciate ID assistance, continuing vanc, flagyl, and cefepime - low suspicion for endocarditis per ID, TTE without eivdence of valvular vegetations on TTE (per ID, "do not feel strongly regarding getting TEE from ID standpoint given low grade bacteremia with GNR/GPR with right  foot being source, would defer need of TEE to neurology if helpful for stroke workup - neurology did not recommend from their standpoint).  Recommending 6 weeks of abx after last debridement intervention (from 3/25). ? ? ?Hypotension ?Post op, likely multifactorial, blood loss, meds ?He was weaned off pressors after small  bolus post op 3/25 ?Will continue to trend Hb/hct ?Blood pressure stable and improving. ?Planning for midodrine 10 mg pre HD ? ?Nausea & vomiting ?Unclear etiology ?Likely secondary to constipation/fecal impaction. ?KUB with stool in rectosigmoid concerning for constipation/fecal impaction ?Disimpaction/enema ordered with good results. ?-Patient denies any further nausea or vomiting. ?-Patient diet advanced to a soft diet which she is tolerating. ?-Patient placed on bowel regimen which per RN patient refused this morning. ?-IV antiemetics as needed.  Supportive care. ? ?Stroke [facility]) ?MRI brain 3/22 with numerous punctate acute infarctions throughout both cerebral hemispheres, primarily affecting the deep brain ?Echo with Ef 45-50%, global hypokinesis, RVSF mildly reduced ?CTA neck 3/20 without LVO or hemodynamically significant stenosis of intracranial arteries, did have 70% stenosis for R ICA due to mixed density atherosclerosis ?CT venogram normal ?LDL 46, A1c 12.2 ?Appreciate neurology recommendations - suspect likely 2/2 hypoperfusion with intermittent hypotension.  Aspirin 81 mg daily.  No statin at this time, LDL at goal. ?Will need outpatient follow-up with neurology. ? ? ?Acute metabolic encephalopathy ?Related to stroke as well as infection and acute hospitalization ?Clinically improving. 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 ? ? ?ESRD (end stage renal disease) ([place]) ?Per renal ?Being evaluated outpatient for possible transplant ?S/p L arm fistulogram with vascular 3/23 -> L central angioplasty innominate and subclavian vein, L peripheral angioplasty cephalic vein ?-Patient with hemodialysis yesterday, on Monday Wednesday Friday schedule. ?-Per nephrology. ? ?DM2 (diabetes mellitus, type 2)  ([place]) ?Hemoglobimn a1c 12.2 (02/02/2022). ?CBG 222 this morning. ?Increase Semglee to 22 units daily.   ?-SSI.  ? ? ?HTN (hypertension) ?Intermittent hypotension, concern that this may have resulted in watershed infarcts. ?Continue to hold antihypertensive medications. ? ?Acute respiratory failure with hypoxia ([place]) ?Currently has been weaned to ra, continue to monitor ?CXR 3/20 without frank edema ? ?Chronic systolic CHF (congestive heart failure) ([place]) ?EF 45% per echo 05/2021 ?Echo 3/22 with EF 45-50% ?Volume per renal ? ?Carotid stenosis ?-Outpatient vascular follow up ? ?GERD (gastroesophageal reflux disease) ?- PPI. ? ?Protein-calorie malnutrition, mild ([place]) ?- Nutritional supplementation. ? ?Necrotizing soft tissue infection ?- See above osteomyelitis/sepsis ? ?Anemia of chronic renal failure ?- H&H stable. ? ?Hyperkalemia ?- On hemodialysis. ? ?Hyponatremia ?- Improving. ? ? ? ? ?  ? ? ?DVT prophylaxis: Heparin ?Code Status: Full ?Family Communication: Updated patient.  No family at bedside. ?Disposition: Likely SNF. ? ?Status is: Inpatient ?Remains inpatient appropriate because: Severity of illness. ?  ?Consultants:  ?Podiatry: Dr. Blenda Mounts 02/02/2022 ?Nephrology: Dr. Joylene Grapes 02/02/2022 ?PCCM: Dr.Ismail 02/03/2022 ?Infectious disease: Dr.Manandhar 02/03/2022 ?Neurology: Dr.Collins 02/04/2022 ?Vascular surgery: Dr.Brabham 02/05/2022 ? ?Procedures:  ?CT head 02/02/2022 ?CT angiogram head and neck 02/03/2022 ?Plain films of the right foot 02/02/2022, 02/08/2022 ?Chest x-ray 02/03/2022 ?Abdominal films 02/04/2022, 02/11/2022 ?MRI right foot/MRI right ankle 02/06/2022 ?2D echo 02/05/2022 ?MRI brain 02/04/2022 ?Left brachiocephalic AV fistula cannulation under ultrasound guidance/lifestyle fistulogram including central venogram/left central angioplasty innominate and subclavian vein/left peripheral angioplasty cephalic vein per Dr. Carlis Abbott vascular surgeon 02/06/2022 ?CT venogram head 02/03/2022 ? ?Antimicrobials:  ?Anti-infectives  (From  admission, onward)  ? ? Start     Dose/Rate Route Frequency Ordered Stop  ? 02/03/22 2200  metroNIDAZOLE (FLAGYL) tablet 500 mg       ? 500 mg Oral Every 12 hours 02/03/22 1108 03/22/22 2359  ? 02/03/22 2000  ceFEPIme (MAXIP

## 2022-02-13 NOTE — Progress Notes (Signed)
Physical Therapy Treatment ?Patient Details ?Name: [name] [name]. ?MRN: [id number] ?DOB: 07-15-1961 ?Today's Date: 02/13/2022 ? ? ?History of Present Illness 61 y.o. male  presents the ED 02/02/22 with altered mental status. Pt found to have osteomyelitis of R foot, sepsis and acute respiratory failure with hypoxia with acute metabolic encephalopathy. 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 Transferred to ICU after LoC and hypotension systolic in 18A placed on 5L O2 and given Narcan.PMH: diabetes mellitus type 2, CHF, ESRD, hypertension, myocardial infarction, nonischemic cardiomyopathy. s/p Right irrigation and debridement with removal of residual bone at 4-5 metatarsals on 3/25. ? ?  ?PT Comments  ? ? Pt making limited progress due to difficulty maintaining NWB status on the RW during gait /transfers.  Emphasis on sit to stand from the chair with stress on maintaining R LE NWB.  When pt unable to maintain, therapist assisted NWB R LE necessitating pt attempting 2 hop to steps. ?   ?Recommendations for follow up therapy are one component of a multi-disciplinary discharge planning process, led by the attending physician.  Recommendations may be updated based on patient status, additional functional criteria and insurance authorization. ? ?Follow Up Recommendations ? Skilled nursing-short term rehab (<3 hours/day) ?  ?  ?Assistance Recommended at Discharge Frequent or constant Supervision/Assistance  ?Patient can return home with the following Two people to help with walking and/or transfers;Two people to help with bathing/dressing/bathroom;Assistance with cooking/housework;Assistance with feeding;Direct supervision/assist for medications management;Direct supervision/assist for financial management;Assist for transportation;Help with stairs or ramp for entrance ?  ?Equipment Recommendations ? Rolling walker (2 wheels);BSC/3in1  ?  ?Recommendations for Other Services   ? ? ?  ?Precautions / Restrictions  Precautions ?Precautions: Fall ?Precaution Comments: WBAT for transfers only R LE ?Required Braces or Orthoses: Other Brace ?Other Brace: Post op Shoe ?Restrictions ?RLE Weight Bearing: Non weight bearing  ?  ? ?Mobility ? Bed Mobility ?Overal bed mobility: Needs Assistance ?Bed Mobility: Sit to Supine ?  ?  ?  ?Sit to supine: Mod assist ?  ?General bed mobility comments: pt started down toward the pillow, but needed bil LE assist to get fully aligned in the bed. ?  ? ?Transfers ?Overall transfer level: Needs assistance ?Equipment used: Rolling walker (2 wheels) ?Transfers: Sit to/from Stand, Bed to chair/wheelchair/BSC ?Sit to Stand: Max assist ?Stand pivot transfers: Mod assist, +2 physical assistance ?Step pivot transfers: Mod assist, +2 physical assistance ?  ?  ?  ?General transfer comment: Burna Cash determined that this patient was unable to actively maintain NWB on the R, therefore therapist provided proximal LE assist into hip flexion necessitating pt trying a pivotal hop, which he did x2 in the RW with RW and assist ?  ? ?Ambulation/Gait ?  ?  ?  ?  ?  ?  ?  ?  ? ? ?Stairs ?  ?  ?  ?  ?  ? ? ?Wheelchair Mobility ?  ? ?Modified Rankin (Stroke Patients Only) ?  ? ? ?  ?Balance Overall balance assessment: Needs assistance ?  ?Sitting balance-Leahy Scale: Fair ?  ?  ?  ?Standing balance-Leahy Scale: Poor ?Standing balance comment: reliant on device and assist ?  ?  ?  ?  ?  ?  ?  ?  ?  ?  ?  ?  ? ?  ?Cognition Arousal/Alertness: Awake/alert ?Behavior During Therapy: Flat affect ?Overall Cognitive Status: Impaired/Different from baseline ?  ?  ?  ?  ?  ?  ?  ?  ?  ?  ?  Current Attention Level: Sustained ?  ?Following Commands: Follows one step commands consistently, Follows multi-step commands with increased time ?Safety/Judgement: Decreased awareness of safety, Decreased awareness of deficits ?Awareness: Intellectual ?  ?General Comments: cues for encouragement and WB status ?  ?  ? ?  ?Exercises   ? ?  ?General  Comments General comments (skin integrity, edema, etc.): vss on RA, pt deferred further work in standing. ?  ?  ? ?Pertinent Vitals/Pain Pain Assessment ?Pain Assessment: Faces ?Faces Pain Scale: Hurts a little bit ?Pain Location: R LE, top of foot ?Pain Descriptors / Indicators: Grimacing ?Pain Intervention(s): Monitored during session  ? ? ?Home Living   ?  ?  ?  ?  ?  ?  ?  ?  ?  ?   ?  ?Prior Function    ?  ?  ?   ? ?PT Goals (current goals can now be found in the care plan section) Acute Rehab PT Goals ?PT Goal Formulation: Patient unable to participate in goal setting ?Time For Goal Achievement: 02/27/22 ?Potential to Achieve Goals: Good ?Progress towards PT goals: Progressing toward goals ? ?  ?Frequency ? ? ? Min 2X/week ? ? ? ?  ?PT Plan Current plan remains appropriate  ? ? ?Co-evaluation   ?  ?  ?  ?  ? ?  ?AM-PAC PT "6 Clicks" Mobility   ?Outcome Measure ? Help needed turning from your back to your side while in a flat bed without using bedrails?: A Little ?Help needed moving from lying on your back to sitting on the side of a flat bed without using bedrails?: A Lot ?Help needed moving to and from a bed to a chair (including a wheelchair)?: Total ?Help needed standing up from a chair using your arms (e.g., wheelchair or bedside chair)?: A Lot ?Help needed to walk in hospital room?: Total ?Help needed climbing 3-5 steps with a railing? : Total ?6 Click Score: 10 ? ?  ?End of Session   ?Activity Tolerance: Patient tolerated treatment well ?Patient left: in bed;with call bell/phone within reach;with bed alarm set ?Nurse Communication: Mobility status ?PT Visit Diagnosis: Muscle weakness (generalized) (M62.81);Difficulty in walking, not elsewhere classified (R26.2) ?  ? ? ?Time: 9562-1308 ?PT Time Calculation (min) (ACUTE ONLY): 10 min ? ?Charges:  $Therapeutic Activity: 8-22 mins          ?          ? ?02/13/2022 ? ?Ginger Carne., PT ?Acute Rehabilitation Services ?[phone]  (pager) ?[phone]   (office) ? ? ?Tessie Fass Vivan Vanderveer ?02/13/2022, 5:26 PM ? ?

## 2022-02-13 NOTE — Progress Notes (Signed)
Occupational Therapy Treatment ?Patient Details ?Name: [name] DAWSON Sr. ?MRN: [id number] ?DOB: 07-24-1961 ?Today's Date: 02/13/2022 ? ? ?History of present illness 61 y.o. male  presents the ED 02/02/22 with altered mental status. Pt found to have osteomyelitis of R foot, sepsis and acute respiratory failure with hypoxia with acute metabolic encephalopathy. 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 Transferred to ICU after LoC and hypotension systolic in 40J placed on 5L O2 and given Narcan.PMH: diabetes mellitus type 2, CHF, ESRD, hypertension, myocardial infarction, nonischemic cardiomyopathy. s/p Right irrigation and debridement with removal of residual bone at 4-5 metatarsals on 3/25. ?  ?OT comments ? Patient required increased time to get to EOB due to patient's fear of having another episode of emesis when getting up. Patient required increased time before attempting transfer to recliner with RW due to patient waiting for dizziness to subside. Patient stated he felt fine following transfer.  Patient to continue to be followed by skilled OT.   ? ?Recommendations for follow up therapy are one component of a multi-disciplinary discharge planning process, led by the attending physician.  Recommendations may be updated based on patient status, additional functional criteria and insurance authorization. ?   ?Follow Up Recommendations ? Skilled nursing-short term rehab (<3 hours/day)  ?  ?Assistance Recommended at Discharge Frequent or constant Supervision/Assistance  ?Patient can return home with the following ? Two people to help with walking and/or transfers;Two people to help with bathing/dressing/bathroom ?  ?Equipment Recommendations ? Other (comment) (TBD, pending progress and maintaining NWB precautions)  ?  ?Recommendations for Other Services   ? ?  ?Precautions / Restrictions Precautions ?Precautions: Fall ?Precaution Comments: WBAT for transfers only R LE ?Required Braces or Orthoses: Other Brace ?Other  Brace: Post op Shoe ?Restrictions ?Weight Bearing Restrictions: Yes ?RLE Weight Bearing: Non weight bearing  ? ? ?  ? ?Mobility Bed Mobility ?Overal bed mobility: Needs Assistance ?Bed Mobility: Supine to Sit ?  ?  ?Supine to sit: Mod assist ?  ?  ?General bed mobility comments: mod assist to get to EOB from supine with assistance for trunk and scooting forward ?  ? ?Transfers ?Overall transfer level: Needs assistance ?Equipment used: Rolling walker (2 wheels) ?Transfers: Bed to chair/wheelchair/BSC ?Sit to Stand: Max assist ?Stand pivot transfers: Mod assist ?  ?  ?  ?  ?General transfer comment: verbal cues to limit WB and for safety with transfer ?  ?  ?Balance Overall balance assessment: Needs assistance ?Sitting-balance support: No upper extremity supported, Feet supported ?Sitting balance-Leahy Scale: Fair ?Sitting balance - Comments: able to maintain balance on EOB ?  ?Standing balance support: Bilateral upper extremity supported, During functional activity ?Standing balance-Leahy Scale: Poor ?Standing balance comment: reliant on device ?  ?  ?  ?  ?  ?  ?  ?  ?  ?  ?  ?   ? ?ADL either performed or assessed with clinical judgement  ? ?ADL Overall ADL's : Needs assistance/impaired ?  ?  ?Grooming: Wash/dry hands;Wash/dry face;Set up;Sitting ?  ?  ?  ?  ?  ?  ?  ?  ?  ?  ?  ?  ?  ?  ?  ?  ?General ADL Comments: Patient required increased time to perform tasks due to patient needing time to process and he stated he was nervous of getting sick ?  ? ?Extremity/Trunk Assessment   ?  ?  ?  ?  ?  ? ?Vision   ?  ?  ?  Perception   ?  ?Praxis   ?  ? ?Cognition Arousal/Alertness: Awake/alert ?Behavior During Therapy: Flat affect ?Overall Cognitive Status: Impaired/Different from baseline ?Area of Impairment: Safety/judgement, Awareness, Following commands, Problem solving ?  ?  ?  ?  ?  ?  ?  ?  ?Orientation Level: Disoriented to, Time, Situation ?Current Attention Level: Sustained ?Memory: Decreased short-term  memory ?Following Commands: Follows one step commands consistently, Follows multi-step commands consistently ?Safety/Judgement: Decreased awareness of safety, Decreased awareness of deficits ?Awareness: Intellectual ?Problem Solving: Slow processing, Decreased initiation ?General Comments: cues for encouragement and WB status ?  ?  ?   ?Exercises   ? ?  ?Shoulder Instructions   ? ? ?  ?General Comments    ? ? ?Pertinent Vitals/ Pain       Pain Assessment ?Pain Assessment: Faces ?Faces Pain Scale: Hurts a little bit ?Pain Location: R LE, top of foot ?Pain Descriptors / Indicators: Grimacing ?Pain Intervention(s): Monitored during session, Repositioned ? ?Home Living   ?  ?  ?  ?  ?  ?  ?  ?  ?  ?  ?  ?  ?  ?  ?  ?  ?  ?  ? ?  ?Prior Functioning/Environment    ?  ?  ?  ?   ? ?Frequency ? Min 2X/week  ? ? ? ? ?  ?Progress Toward Goals ? ?OT Goals(current goals can now be found in the care plan section) ? Progress towards OT goals: Progressing toward goals ? ?Acute Rehab OT Goals ?Patient Stated Goal: get better ?OT Goal Formulation: With patient ?Time For Goal Achievement: 02/18/22 ?Potential to Achieve Goals: Good ?ADL Goals ?Pt Will Perform Grooming: with set-up;sitting ?Pt Will Perform Lower Body Bathing: with mod assist;sitting/lateral leans;sit to/from stand ?Pt Will Transfer to Toilet: with mod assist;bedside commode ?Additional ADL Goal #1: Pt to demo ability to follow 2 step commands > 75% of the time  ?Plan Discharge plan remains appropriate   ? ?Co-evaluation ? ? ?   ?  ?  ?  ?  ? ?  ?AM-PAC OT "6 Clicks" Daily Activity     ?Outcome Measure ? ? Help from another person eating meals?: A Little ?Help from another person taking care of personal grooming?: A Little ?Help from another person toileting, which includes using toliet, bedpan, or urinal?: Total ?Help from another person bathing (including washing, rinsing, drying)?: A Lot ?Help from another person to put on and taking off regular upper body clothing?:  A Little ?Help from another person to put on and taking off regular lower body clothing?: A Lot ?6 Click Score: 14 ? ?  ?End of Session Equipment Utilized During Treatment: Rolling walker (2 wheels) ? ?OT Visit Diagnosis: Unsteadiness on feet (R26.81);Other abnormalities of gait and mobility (R26.89);Muscle weakness (generalized) (M62.81);Other symptoms and signs involving cognitive function ?  ?Activity Tolerance Patient tolerated treatment well ?  ?Patient Left in chair;with call bell/phone within reach;with chair alarm set ?  ?Nurse Communication Mobility status ?  ? ?   ? ?Time: 1136-1209 ?OT Time Calculation (min): 33 min ? ?Charges: OT General Charges ?$OT Visit: 1 Visit ?OT Treatments ?$Self Care/Home Management : 8-22 mins ?$Therapeutic Activity: 8-22 mins ? ?Lodema Hong, OTA ?Acute Rehabilitation Services  ?Pager [phone] ?Office [phone] ? ? ?Trixie Dredge ?02/13/2022, 1:19 PM ?

## 2022-02-13 NOTE — Assessment & Plan Note (Addendum)
-  Improving on HD. ?-Sodium at 134 yesterday and today is 131 ?-Continue to Monitor and Trend ?-Repeat CMP in the AM  ?

## 2022-02-13 NOTE — Progress Notes (Signed)
?[facility] ?Progress Note  ? ?Subjective: Seen in room working with Speech Therapy. Speech HAS improved since last seen last week. No C/Os. HD tomorrow on schedule.   ? ?Objective ?Vitals:  ? 02/12/22 2304 02/13/22 0352 02/13/22 0718 02/13/22 1139  ?BP: (!) 112/52 122/61 (!) 118/55 130/63  ?Pulse: 83 79 83 81  ?Resp: 20 16 20 18   ?Temp: 97.8 ?F (36.6 ?C) 98.2 ?F (36.8 ?C) 98.9 ?F (37.2 ?C) 98.4 ?F (36.9 ?C)  ?TempSrc: Oral Oral Oral   ?SpO2: 99% 94% 97% 97%  ?Weight:  109.5 kg    ?Height:      ? ?Physical Exam ?General: Chronically ill appearing male in NAD ?Heart: S1,S2 RRR No M/R/G ?Lungs: No WOB. CTAB ?Abdomen: Obese, NT, ND ?Extremities: No LE edema. [place] intact on foot.  ?Dialysis Access: L AVF +Thrill ? ?Additional Objective ?Labs: ?Basic Metabolic Panel: ?Recent Labs  ?Lab 02/11/22 ?0174 02/12/22 ?9449 02/13/22 ?0414  ?NA 133* 134* 134*  ?K 4.3 4.1 4.3  ?CL 95* 95* 96*  ?CO2 25 26 26   ?GLUCOSE 140* 126* 227*  ?BUN 31* 40* 22*  ?CREATININE 9.00* 11.54* 8.31*  ?CALCIUM 8.3* 8.7* 8.3*  ?PHOS 4.2 5.9* 4.0  ? ?Liver Function Tests: ?Recent Labs  ?Lab 02/09/22 ?0751 02/10/22 ?0421 02/11/22 ?6759 02/12/22 ?1638 02/13/22 ?0414  ?AST 25 22 24   --   --   ?ALT 12 18 19   --   --   ?ALKPHOS 68 72 63  --   --   ?BILITOT 0.9 0.7 0.9  --   --   ?PROT 6.2* 6.9 6.7  --   --   ?ALBUMIN 2.2* 2.4* 2.3* 2.2* 2.2*  ? ?No results for input(s): LIPASE, AMYLASE in the last 168 hours. ?CBC: ?Recent Labs  ?Lab 02/09/22 ?0751 02/09/22 ?1613 02/10/22 ?0421 02/11/22 ?4665 02/12/22 ?9935 02/13/22 ?0414  ?WBC 12.6*  --  15.4* 14.7* 14.1* 11.9*  ?NEUTROABS 8.1*  --  10.6* 10.0* 10.3* 8.1*  ?HGB 8.4*   < > 9.1* 8.4* 8.4* 8.9*  ?HCT 25.6*   < > 27.2* 25.8* 25.7* 27.4*  ?MCV 94.5  --  93.5 94.5 95.5 96.1  ?PLT 368  --  436* 441* 464* 428*  ? < > = values in this interval not displayed.  ? ?Blood Culture ?   ?Component Value Date/Time  ? SDES BLOOD RIGHT HAND 02/07/2022 0648  ? SPECREQUEST  02/07/2022 0648  ?  BOTTLES DRAWN  AEROBIC AND ANAEROBIC Blood Culture results may not be optimal due to an inadequate volume of blood received in culture bottles  ? CULT  02/07/2022 0648  ?  NO GROWTH 5 DAYS ?Performed at [facility], [place] [address] [place], [place] [address] ?  ? REPTSTATUS 02/12/2022 FINAL 02/07/2022 0648  ? ? ?Cardiac Enzymes: ?No results for input(s): CKTOTAL, CKMB, CKMBINDEX, TROPONINI in the last 168 hours. ?CBG: ?Recent Labs  ?Lab 02/12/22 ?0600 02/12/22 ?1630 02/12/22 ?2108 02/13/22 ?0604 02/13/22 ?1137  ?GLUCAP 88 87 229* 222* 188*  ? ?Iron Studies: No results for input(s): IRON, TIBC, TRANSFERRIN, FERRITIN in the last 72 hours. ?@lablastinr3 @ ?Studies/Results: ?DG Abd 1 View ? ?Result Date: 02/11/2022 ?CLINICAL DATA:  Sudden onset of nausea and vomiting. EXAM: ABDOMEN - 1 VIEW COMPARISON:  None. FINDINGS: The bowel gas pattern is normal. Large amount of retained stool in the colorectal region. No radio-opaque calculi or other significant radiographic abnormality are seen. IMPRESSION: 1. Nonobstructive bowel gas pattern. 2. Large amount of stool in the rectosigmoid  region concerning for constipation/fecal impaction. Electronically Signed   By: Keane Police D.O.   On: 02/11/2022 16:04   ?Medications: ? ceFEPime (MAXIPIME) IV 2 g (02/12/22 2100)  ? sodium chloride    ? vancomycin 1,000 mg (02/12/22 1802)  ? ? (feeding supplement) PROSource Plus  30 mL Oral BID BM  ? vitamin C  500 mg Oral Daily  ? aspirin EC  81 mg Oral Daily  ? Chlorhexidine Gluconate Cloth  6 each Topical Q0600  ? darbepoetin (ARANESP) injection - DIALYSIS  100 mcg Intravenous Q Wed-HD  ? famotidine  20 mg Oral Daily  ? feeding supplement (NEPRO CARB STEADY)  237 mL Oral BID BM  ? heparin  5,000 Units Subcutaneous Q8H  ? insulin aspart  0-15 Units Subcutaneous TID WC  ? insulin aspart  0-5 Units Subcutaneous QHS  ? insulin glargine-yfgn  20 Units Subcutaneous Daily  ? metroNIDAZOLE  500 mg Oral Q12H  ? midodrine  10 mg Oral Q M,W,F-HD  ? multivitamin   1 tablet Oral QHS  ? pantoprazole  40 mg Oral Q1200  ? polyethylene glycol  17 g Oral BID  ? senna-docusate  1 tablet Oral BID  ? sorbitol, milk of mag, mineral oil, glycerin (SMOG) enema  960 mL Rectal Once  ? sucroferric oxyhydroxide  1,000 mg Oral TID WC  ? thiamine injection  100 mg Intravenous Daily  ? Or  ? thiamine  100 mg Oral Daily  ? zinc sulfate  220 mg Oral BID  ? ? ? ?Dialysis Orders:  MWF [facility] ?4h 49min  450/500    117.8kg   2/ 2.5Ca bath L AVF ?--Hep 6000 units IV TIW ? - on 3/19 > hep B Ag neg, and Hep B Ab's high/ protective ? - calcitriol 1.49mcg PO qHD-last 01/31/22 ?  ?Assessment/Plan: ?R foot osteomyelitis- on IV vanc/cefepime/Flagyl. + Morganella bacteremia 2/2 foot abscess. S/p R foot I&D on 3/19 by podiatry and another I&D done 3/25 now with wound vac. Will need 6 weeks ABX for attempted limb salvage from 3/25.  -- Vanc/cefepime + PO Flagyl ?Acute metabolic encephalopathy: Improved. Per primary.  ?CVA: Neurology consulted. MRI done 02/04/2022 Numerous bilat punctate acute infarctions. Per primary. Avoid hypotension ?ESRD - on HD MWF. Cont on schedule: 2K, no heparin, AVF, 450/800, 2.5 Ca, keep SBP > 120. Next HD 02/14/2022.  ?Hypertension/volume  - Blood pressures acceptable; euvolemic on exam. UF as tolerated. Avoid hypotension during HD. Keep SBP >120. Very much under OP EDW. Will need EDW reset on discharge.  ?Anemia of CKD - Hb drifting down, ESA darbe 100 next Tx cont qWed, check Fe but would likely not give in setting of #1 ?Secondary Hyperparathyroidism -  CCa okay. Continue VDRA with HD. Phos stable ?HD access: L AVF w/ thinning, ulcerated area at the lower portion. Stick away from area. VVS consulted and went for fistulogram on 3/23 by VVS > they did L central venoplasty to innominate and subclavian veins, and peripheral venoplasty to the cephalic vein.  ? ?Jimmye [name]. Caleigha Zale NP-C ?02/13/2022, 12:42 PM  ?[facility] ?[phone] ? ? ?  ? ?

## 2022-02-13 NOTE — Progress Notes (Signed)
Nutrition Follow-up ? ?DOCUMENTATION CODES:  ?Not applicable ? ?INTERVENTION:  ?- Continue Nepro Shake po BID between meals, each supplement provides 425 kcal and 19 grams protein ?- Continue ProSource Plus po BID, each supplement provides 100 kcal and 15 grams of protein ?- Continue renal MVI daily ?- Continue zinc sulfate 220 mg BID x 14 days total and vitamin C 500 mg daily to aid in wound healing ?- Encourage PO intake ? ?NUTRITION DIAGNOSIS:  ?Increased nutrient needs related to wound healing, chronic illness (CHF, ESRD) as evidenced by estimated needs. ?Ongoing, being addressed via oral nutrition supplements ? ?GOAL:  ?Patient will meet greater than or equal to 90% of their needs ?Progressing ? ?MONITOR:  ?PO intake, Supplement acceptance, Labs, Weight trends, Skin, I & O's ? ?REASON FOR ASSESSMENT:  ?Consult ?Wound healing ? ?ASSESSMENT:  ?61 year old male who presented to the ED on 3/19 with AMS. PMH of ESRD on HD, T2DM, HTN, CHF, myocardial infarction, nonischemic cardiomyopathy. Pt admitted with sepsis secondary to osteomyelitis. ? ?03/19 - s/p I&D R foot and revision of fifth ray amputation ?03/23 - s/p L brachiocephalic arteriovenous fistula cannulation, L arm fistulogram, L central angioplasty, L peripheral angioplasty ?03/25 - I&D w/ removal of residual bone at 4-5 metatarsals of R foot ?03/28 - diet downgraded to full liquids ?03/29 - diet advanced to dysphagia 3 ? ?Pt is supposed to discharge to SNF pending bed availability. PO intake has declined as pt began endorsing N/V of unclear etiology 3/28. Workup pending per MD. Continue providing ONS to supplement diet.  ? ?PO Intake: 0-100% x last 8 recorded meals (~49% avg meal intake) ? ?Last iHD was on 3/29 with 2L net UF.  ?EDW: 117.8 kg ?Admit weight: 121.1 kg ?Current weight: 109.5 kg ?Unsure if this weight is accurate given pre HD weight was 120.7 kg and UF was 2L -- suspect weight of 109.5 kg is incorrect ? ?No UOP documented x 24 hours ?WoundVAC:  66ml x24 hours ?I/O: -40105ml since admit ? ?Medications: ? (feeding supplement) PROSource Plus  30 mL Oral BID BM  ? vitamin C  500 mg Oral Daily  ? darbepoetin (ARANESP) injection - DIALYSIS  100 mcg Intravenous Q Wed-HD  ? famotidine  20 mg Oral Daily  ? feeding supplement (NEPRO CARB STEADY)  237 mL Oral BID BM  ? heparin  5,000 Units Subcutaneous Q8H  ? insulin aspart  0-15 Units Subcutaneous TID WC  ? insulin aspart  0-5 Units Subcutaneous QHS  ? insulin glargine-yfgn  20 Units Subcutaneous Daily  ? metroNIDAZOLE  500 mg Oral Q12H  ? multivitamin  1 tablet Oral QHS  ? pantoprazole  40 mg Oral Q1200  ? polyethylene glycol  17 g Oral BID  ? senna-docusate  1 tablet Oral BID  ? sorbitol, milk of mag, mineral oil, glycerin (SMOG) enema  960 mL Rectal Once  ? sucroferric oxyhydroxide  1,000 mg Oral TID WC  ? thiamine injection  100 mg Intravenous Daily  ? Or  ? thiamine  100 mg Oral Daily  ? zinc sulfate  220 mg Oral BID  ?Labs reviewed: Na 134 (L) ?CBGs: 87-229 x 24 hours ? ?Diet Order:   ?Diet Order   ? ?       ?  DIET DYS 3 Room service appropriate? Yes; Fluid consistency: Thin  Diet effective now       ?  ? ?  ?  ? ?  ? ?EDUCATION NEEDS:  ?Education needs have been addressed ? ?  Skin:  Skin Assessment:: ?Skin Integrity Issues: ?Diabetic Ulcer: R foot ? ?Last BM:  3/29 ? ?Height:  ?Ht Readings from Last 1 Encounters:  ?02/02/22 6\' 1"  (1.854 m)  ? ?Weight:  ?Wt Readings from Last 1 Encounters:  ?02/13/22 109.5 kg  ? ?BMI:  Body mass index is 31.85 kg/m?. ? ?Estimated Nutritional Needs:  ?Kcal:  2200-2400 ?Protein:  115-130 grams ?Fluid:  1000 ml + UOP ? ? ? ?Theone Stanley., MS, RD, LDN (she/her/hers) ?RD pager number and weekend/on-call pager number located in [place] ? ?

## 2022-02-13 NOTE — Progress Notes (Addendum)
Pharmacy Antibiotic Note- follow-up ? ?[name] BODEY Sr. is a 61 y.o. male admitted on 02/02/2022 with bacteremia secondary to foot abscess.  Pharmacy has been consulted for vancomycin and cefepime dosing. Patient has HD MWF and gets vancomycin 1 g IV post HD. VR 27. New BCx result with morganella morganii, clostridium species resistant to ampicillin, unasyn, and cefazolin. Continue all abx for 6 weeks after last I&D (stop date: 03/22/22). ? ?Plan: ?Continue vancomycin 1 g qMWF post HD  ?Continue cefepime 2 g IV qMWF post HD ?Continue flagyl 500 mg PO BID ? ?Height: 6\' 1"  (185.4 cm) ?Weight: 109.5 kg (241 lb 6.5 oz) (weight with flatsheet and 2 bedpads) ?IBW/kg (Calculated) : 79.9 ? ?Temp (24hrs), Avg:98.6 ?F (37 ?C), Min:97.8 ?F (36.6 ?C), Max:98.9 ?F (37.2 ?C) ? ?Recent Labs  ?Lab 02/07/22 ?0321 02/08/22 ?0451 02/09/22 ?0751 02/10/22 ?0421 02/11/22 ?2248 02/12/22 ?2500 02/13/22 ?0414  ?WBC 10.2   < > 12.6* 15.4* 14.7* 14.1* 11.9*  ?CREATININE 11.45*   < > 11.00* 12.50* 9.00* 11.54* 8.31*  ?VANCORANDOM 27  --   --   --   --   --   --   ? < > = values in this interval not displayed.  ? ?  ?Estimated Creatinine Clearance: 12.4 mL/min (A) (by C-G formula based on SCr of 8.31 mg/dL (H)).   ? ?Allergies  ?Allergen Reactions  ? Sulfa Antibiotics Nausea And Vomiting  ? ? ?Antimicrobials this admission: ?Cefepime 3/20 >> [5/6] ?Metronidazole 3/20 >> [5/6] ?Vancomycin 3/20 >> [5/6] ? ?Dose adjustments this admission: ?None. ? ?Microbiology results: ?3/19 BCx: morganella morganii (resistant to amp, unasyn, and cefazolin) ?3/19 WCx morganella morganii (resistant to amp, unasyn, and cefazolin) ?3/20 MRSA PCR surgical: positive ? ?3/31 Vancomycin random level due ? ? ?Thank you for allowing pharmacy to be a part of this patient?s care. ? ?Dani Anastasovites BS ?Pharmacy Student ?02/13/2022 7:59 AM ? ? ?Elyon Zoll BS, PharmD, BCPS ?Clinical Pharmacist ?02/13/2022 11:37 AM ? ? ? ? ?  ? ? ?

## 2022-02-14 DIAGNOSIS — M869 Osteomyelitis, unspecified: Secondary | ICD-10-CM | POA: Diagnosis not present

## 2022-02-14 DIAGNOSIS — J9601 Acute respiratory failure with hypoxia: Secondary | ICD-10-CM | POA: Diagnosis not present

## 2022-02-14 DIAGNOSIS — G9341 Metabolic encephalopathy: Secondary | ICD-10-CM | POA: Diagnosis not present

## 2022-02-14 DIAGNOSIS — N186 End stage renal disease: Secondary | ICD-10-CM | POA: Diagnosis not present

## 2022-02-14 LAB — CBC WITH DIFFERENTIAL/PLATELET
Abs Immature Granulocytes: 0.1 10*3/uL — ABNORMAL HIGH (ref 0.00–0.07)
Basophils Absolute: 0.1 10*3/uL (ref 0.0–0.1)
Basophils Relative: 1 %
Eosinophils Absolute: 0.5 10*3/uL (ref 0.0–0.5)
Eosinophils Relative: 5 %
HCT: 26.3 % — ABNORMAL LOW (ref 39.0–52.0)
Hemoglobin: 8.3 g/dL — ABNORMAL LOW (ref 13.0–17.0)
Immature Granulocytes: 1 %
Lymphocytes Relative: 15 %
Lymphs Abs: 1.6 10*3/uL (ref 0.7–4.0)
MCH: 30 pg (ref 26.0–34.0)
MCHC: 31.6 g/dL (ref 30.0–36.0)
MCV: 94.9 fL (ref 80.0–100.0)
Monocytes Absolute: 1.4 10*3/uL — ABNORMAL HIGH (ref 0.1–1.0)
Monocytes Relative: 14 %
Neutro Abs: 6.8 10*3/uL (ref 1.7–7.7)
Neutrophils Relative %: 64 %
Platelets: 455 10*3/uL — ABNORMAL HIGH (ref 150–400)
RBC: 2.77 MIL/uL — ABNORMAL LOW (ref 4.22–5.81)
RDW: 17.6 % — ABNORMAL HIGH (ref 11.5–15.5)
WBC: 10.5 10*3/uL (ref 4.0–10.5)
nRBC: 0.2 % (ref 0.0–0.2)

## 2022-02-14 LAB — RENAL FUNCTION PANEL
Albumin: 2.2 g/dL — ABNORMAL LOW (ref 3.5–5.0)
Anion gap: 11 (ref 5–15)
BUN: 29 mg/dL — ABNORMAL HIGH (ref 6–20)
CO2: 26 mmol/L (ref 22–32)
Calcium: 8.6 mg/dL — ABNORMAL LOW (ref 8.9–10.3)
Chloride: 94 mmol/L — ABNORMAL LOW (ref 98–111)
Creatinine, Ser: 10.92 mg/dL — ABNORMAL HIGH (ref 0.61–1.24)
GFR, Estimated: 5 mL/min — ABNORMAL LOW (ref >60)
Glucose, Bld: 170 mg/dL — ABNORMAL HIGH (ref 70–99)
Phosphorus: 4.8 mg/dL — ABNORMAL HIGH (ref 2.5–4.6)
Potassium: 4.1 mmol/L (ref 3.5–5.1)
Sodium: 131 mmol/L — ABNORMAL LOW (ref 135–145)

## 2022-02-14 LAB — VANCOMYCIN, RANDOM: Vancomycin Rm: 34

## 2022-02-14 LAB — GLUCOSE, CAPILLARY
Glucose-Capillary: 167 mg/dL — ABNORMAL HIGH (ref 70–99)
Glucose-Capillary: 176 mg/dL — ABNORMAL HIGH (ref 70–99)
Glucose-Capillary: 191 mg/dL — ABNORMAL HIGH (ref 70–99)
Glucose-Capillary: 207 mg/dL — ABNORMAL HIGH (ref 70–99)

## 2022-02-14 MED ORDER — HEPARIN SODIUM (PORCINE) 1000 UNIT/ML DIALYSIS
6000.0000 [IU] | Freq: Once | INTRAMUSCULAR | Status: DC
  Filled 2022-02-14: qty 6

## 2022-02-14 MED ORDER — LIDOCAINE-PRILOCAINE 2.5-2.5 % EX CREA: 1.0000 "application " | TOPICAL_CREAM | CUTANEOUS | Status: DC | PRN

## 2022-02-14 MED ORDER — PENTAFLUOROPROP-TETRAFLUOROETH EX AERO: 1.0000 "application " | INHALATION_SPRAY | CUTANEOUS | Status: DC | PRN

## 2022-02-14 MED ORDER — SODIUM CHLORIDE 0.9 % IV SOLN: 100.0000 mL | INTRAVENOUS | Status: DC | PRN

## 2022-02-14 MED ORDER — ALTEPLASE 2 MG IJ SOLR: 2.0000 mg | Freq: Once | INTRAMUSCULAR | Status: DC | PRN

## 2022-02-14 MED ORDER — VANCOMYCIN HCL 750 MG/150ML IV SOLN
INTRAVENOUS | Status: AC
  Filled 2022-02-14: qty 150

## 2022-02-14 MED ORDER — HEPARIN SODIUM (PORCINE) 1000 UNIT/ML DIALYSIS: 3000.0000 [IU] | INTRAMUSCULAR | Status: DC | PRN

## 2022-02-14 MED ORDER — VANCOMYCIN HCL 750 MG/150ML IV SOLN
750.0000 mg | INTRAVENOUS | Status: DC
  Administered 2022-02-14 – 2022-02-17 (×2): 750 mg via INTRAVENOUS
  Filled 2022-02-14 (×2): qty 150

## 2022-02-14 MED ORDER — LIDOCAINE HCL (PF) 1 % IJ SOLN: 5.0000 mL | INTRAMUSCULAR | Status: DC | PRN

## 2022-02-14 NOTE — TOC Progression Note (Signed)
Transition of Care (TOC) - Progression Note  ? ? ?Patient Details  ?Name: [name] [name]. ?MRN: [id number] ?Date of Birth: 06-01-1961 ? ?Transition of Care (TOC) CM/SW Contact  ?Geralynn Ochs, LCSW ?Phone Number: ?02/14/2022, 4:20 PM ? ?Clinical Narrative:   CSW attempted to reach patient's daughter today to discuss SNF choice. No answer, CSW left a voicemail requesting a call back with a choice. CSW to follow. ? ? ? ?Expected Discharge Plan: [place] ?Barriers to Discharge: Continued Medical Work up ? ?Expected Discharge Plan and Services ?Expected Discharge Plan: [place] ?  ?  ?  ?  ?                ?  ?  ?  ?  ?  ?  ?  ?  ?  ?  ? ? ?Social Determinants of Health (SDOH) Interventions ?  ? ?Readmission Risk Interventions ?   ? View : No data to display.  ?  ?  ?  ? ? ?

## 2022-02-14 NOTE — Consult Note (Signed)
[facility] Nurse Consult Note: ?Pt is followed by the podiatry service for post-op wound to right foot.  Vac dressing change to right outer foot with full thickness post-op wound; beefy red with exposed tendons, small amt bloody drainage.  Pt denies need for pain meds and tolerated without apparent discomfort.  ?Dressing procedure/placement/frequency: Applied Mepitel over the wound bed to protect the exposed tendons, then 2 barrier rings around the wound edges to attempt to maintain a seal, then one piece black foam to 17mm cont suction.  [facility] team will change again on Mon if patient is still in the hospital at that time.  ?Julien Girt MSN, RN, [place], [place], CNS ?[phone]  ?  ?

## 2022-02-14 NOTE — Progress Notes (Signed)
?PROGRESS NOTE ? ? ? ?[name] FULLILOVE Sr.  KDX:[id number] DOB: Dec 05, 1960 DOA: 02/02/2022 ?PCP: [facility]  ? ? ?Chief Complaint  ?Patient presents with  ? Altered Mental Status  ? ? ?Brief Narrative:  ?61 y/o male history of DM, ESRD on HD, admitted to the hospital with fever, acute encephalopathy, sepsis and right foot infection.  He has a history of prior right fifth metatarsal amputation performed many years ago in [place].  Plain films of his foot indicate swelling and subcutaneous gas without any obvious bony erosion. He [facility] presented to [facility] and subsequently transferred to [facility].  Podiatry consulted, he was taken emergently to the [facility] on 3/19.  Postoperatively he has had persistent lethargy and was taken to the ICU 3/19 evening.  He was placed on Narcan drip for presumed lethargy due to opioid use however he only got 5 mg of oxycodone.  Narcan drip was discontinued on 3/20 and he was transferred out of the ICU. He continued to have altered mental status and was found to have findings consistent with acute strokes.  Neurology was consulted and suspects related to hypotension.  His mental status has gradually improved.  Blood cultures were positive with morganella and clostridium related to his foot infection.  ID has been consulted and recommended 6 weeks of abx after last debridement.  He went back to OR on 3/25 for I&D with podiatry.  Hospitalization c/b malfunctioning L AV fistula, he's s/p fistulogram with vascular on 3/23.  At this time, plan is for d/c to SNF when bed available.  He had nausea/vomiting of unclear etiology 3/28, workup pending. ? ?See below for additional details ?   ? ? ?Assessment & Plan: ? Principal Problem: ?  Osteomyelitis ([place]) ?Active Problems: ?  Severe sepsis ([place]) ?  Nausea & vomiting ?  Hypotension ?  Acute metabolic encephalopathy ?  Stroke [facility]) ?  ESRD (end stage renal disease) ([place]) ?  DM2 (diabetes mellitus, type 2) ([place]) ?  HTN  (hypertension) ?  Chronic systolic CHF (congestive heart failure) ([place]) ?  Acute respiratory failure with hypoxia ([place]) ?  Protein-calorie malnutrition, mild ([place]) ?  GERD (gastroesophageal reflux disease) ?  Carotid stenosis ?  Hyponatremia ?  Hyperkalemia ?  High anion gap metabolic acidosis ?  Anemia of chronic renal failure ?  Necrotizing soft tissue infection ?  Cellulitis in diabetic foot ([place]) ?  Bacteremia ?  Right foot infection ? ? ? 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 ?Appreciate ID assistance, continuing vanc, flagyl, and cefepime - low suspicion for endocarditis per ID, TTE without eivdence of valvular vegetations on TTE (per ID, "do not feel strongly regarding getting TEE from ID standpoint given low grade bacteremia with GNR/GPR with right  foot being source, would defer need of TEE to neurology if helpful for stroke workup - neurology did not recommend from their standpoint).  Recommending 6 weeks of abx after last debridement intervention (from 3/25). ? ? ?Hypotension ?Post op, likely multifactorial, blood loss, meds ?He was weaned off pressors after small  bolus post op 3/25 ?Will continue to trend Hb/hct ?Blood pressure stable and improving. ?Continue midodrine 10 mg pre HD ? ?Nausea & vomiting ?Unclear etiology ?Likely secondary to constipation/fecal impaction. ?KUB with stool in rectosigmoid concerning for constipation/fecal impaction ?Disimpaction/enema ordered with good results. ?-Clinical improvement. ?-Patient denies any further nausea or vomiting. ?-Patient diet advanced to a soft diet which she is tolerating. ?-Patient placed on bowel regime. ?-IV antiemetics as needed.  Supportive care. ? ?Stroke [facility]) ?MRI brain 3/22 with numerous punctate acute infarctions throughout both cerebral hemispheres, primarily affecting the deep brain ?Echo with Ef 45-50%, global hypokinesis, RVSF mildly reduced ?CTA neck 3/20 without LVO or hemodynamically significant stenosis of intracranial arteries, did have 70% stenosis for R ICA due to mixed density atherosclerosis ?CT venogram normal ?LDL 46, A1c 12.2 ?Appreciate neurology recommendations - suspect likely 2/2 hypoperfusion with intermittent hypotension.  Aspirin 81 mg daily.  No statin at this time, LDL at goal. ?Will need outpatient follow-up with neurology. ? ? ?Acute metabolic encephalopathy ?Related to stroke as well as infection and acute hospitalization ?Improving clinically. 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 ? ? ?ESRD (end stage renal disease) ([place]) ?Per renal ?Being evaluated outpatient for possible transplant ?S/p L arm fistulogram with vascular 3/23 -> L central angioplasty innominate and subclavian vein, L peripheral angioplasty cephalic vein ?-Patient with hemodialysis Monday Wednesday Friday schedule.   ?-Patient just returned from hemodialysis today.   ?-Per nephrology. ? ?DM2 (diabetes mellitus, type  2) ([place]) ?Hemoglobimn a1c 12.2 (02/02/2022). ?CBG 167 this morning. ?Continue Semglee to 20 units daily.   ?-SSI.  ? ? ?HTN (hypertension) ?Intermittent hypotension, concern that this may have resulted in watershed infarcts. ?Continue to hold antihypertensive medications. ?Midodrine pre-HD ? ?Acute respiratory failure with hypoxia ([place]) ?Currently has been weaned to ra, continue to monitor ?CXR 3/20 without frank edema ? ?Chronic systolic CHF (congestive heart failure) ([place]) ?EF 45% per echo 05/2021 ?Echo 3/22 with EF 45-50% ?Volume per renal ? ?Carotid stenosis ?-Outpatient vascular follow up ? ?GERD (gastroesophageal reflux disease) ?- PPI, Pepcid. ? ?Protein-calorie malnutrition, mild ([place]) ?- Nutritional supplementation. ? ?Necrotizing soft tissue infection ?- See above osteomyelitis/sepsis ? ?Anemia of chronic renal failure ?- H&H stable. ? ?Hyperkalemia ?- On hemodialysis. ?-Potassium of 4.1 today ? ?Hyponatremia ?- Improving on HD. ? ? ? ? ?  ? ? ?DVT prophylaxis: Heparin ?Code Status: Full ?Family Communication: Updated patient.  No family at bedside. ?Disposition: Likely SNF. ? ?Status is: Inpatient ?Remains inpatient appropriate because: Severity of illness. ?  ?Consultants:  ?Podiatry: Dr. Blenda Mounts 02/02/2022 ?Nephrology: Dr. Joylene Grapes 02/02/2022 ?PCCM: Dr.Ismail 02/03/2022 ?Infectious disease: Dr.Manandhar 02/03/2022 ?Neurology: Dr.Collins 02/04/2022 ?Vascular surgery: Dr.Brabham 02/05/2022 ? ?Procedures:  ?CT head 02/02/2022 ?CT angiogram head and neck 02/03/2022 ?Plain films of the right foot 02/02/2022, 02/08/2022 ?Chest x-ray 02/03/2022 ?Abdominal films 02/04/2022, 02/11/2022 ?MRI right foot/MRI right ankle 02/06/2022 ?2D echo 02/05/2022 ?MRI brain 02/04/2022 ?Left brachiocephalic AV fistula cannulation under ultrasound guidance/lifestyle fistulogram including central venogram/left central angioplasty innominate and subclavian vein/left peripheral angioplasty cephalic vein per Dr. Carlis Abbott vascular surgeon  02/06/2022 ?CT  venogram head 02/03/2022 ? ?Antimicrobials:  ?Anti-infectives (From admission, onward)  ? ? Start     Dose/Rate Route Frequency Ordered Stop  ? 02/17/22 1200  vancomycin (VANCOREADY) IVPB 750 mg/150 mL       ? 750 mg ?1

## 2022-02-14 NOTE — TOC Progression Note (Signed)
Transition of Care (TOC) - Progression Note  ? ? ?Patient Details  ?Name: [name] BELSKY Sr. ?MRN: [id number] ?Date of Birth: [birth year] ? ?Transition of Care (TOC) CM/SW Contact  ?Geralynn Ochs, LCSW ?Phone Number: ?02/14/2022, 4:19 PM ? ?Clinical Narrative:   CSW spoke with patient's daughter to ask about SNF choice. Daughter indicated that she wasn't thrilled with the [facility] options and asked about [facility] options. CSW provided daughter with bed offers for [place], and daughter to review. CSW reminded daughter that the patient is ready for SNF and we need a choice. Daughter said she would have a choice by tomorrow. CSW to follow. ? ? ? ?Expected Discharge Plan: [place] ?Barriers to Discharge: Continued Medical Work up ? ?Expected Discharge Plan and Services ?Expected Discharge Plan: [employer] ?  ?  ?  ?  ?                ?  ?  ?  ?  ?  ?  ?  ?  ?  ?  ? ? ?Social Determinants of Health (SDOH) Interventions ?  ? ?Readmission Risk Interventions ?   ? View : No data to display.  ?  ?  ?  ? ? ?

## 2022-02-14 NOTE — Procedures (Signed)
removed 1510mls net fluid unable to remove more due to hypotension. midodrine given for bp support, also given vancomycin as ordered. pre bp 136/64 post bp 116/55 pre weight 116.0kg post weight 114.5kg bed scales.  2 bandges to lua avf no bleeding dressing cdi. ?

## 2022-02-14 NOTE — Procedures (Signed)
I was present at this dialysis session. I have reviewed the session itself and made appropriate changes.  ? ?2K bath AM K was 4.1, 2.5L UF goal. Using AVF.  Pt w/o c/o or needs.  Hb stable in 8s on ESA. ? ? ? ?Filed Weights  ? 02/12/22 1151 02/13/22 0352 02/14/22 0736  ?Weight: 120.7 kg 109.5 kg 116 kg  ? ? ?Recent Labs  ?Lab 02/14/22 ?7371  ?NA 131*  ?K 4.1  ?CL 94*  ?CO2 26  ?GLUCOSE 170*  ?BUN 29*  ?CREATININE 10.92*  ?CALCIUM 8.6*  ?PHOS 4.8*  ? ? ?Recent Labs  ?Lab 02/12/22 ?0923 02/13/22 ?0414 02/14/22 ?0626  ?WBC 14.1* 11.9* 10.5  ?NEUTROABS 10.3* 8.1* 6.8  ?HGB 8.4* 8.9* 8.3*  ?HCT 25.7* 27.4* 26.3*  ?MCV 95.5 96.1 94.9  ?PLT 464* 428* 455*  ? ? ?Scheduled Meds: ? (feeding supplement) PROSource Plus  30 mL Oral BID BM  ? vitamin C  500 mg Oral Daily  ? aspirin EC  81 mg Oral Daily  ? Chlorhexidine Gluconate Cloth  6 each Topical Q0600  ? darbepoetin (ARANESP) injection - DIALYSIS  100 mcg Intravenous Q Wed-HD  ? famotidine  20 mg Oral Daily  ? feeding supplement (NEPRO CARB STEADY)  237 mL Oral BID BM  ? heparin  5,000 Units Subcutaneous Q8H  ? insulin aspart  0-15 Units Subcutaneous TID WC  ? insulin aspart  0-5 Units Subcutaneous QHS  ? insulin glargine-yfgn  20 Units Subcutaneous Daily  ? metroNIDAZOLE  500 mg Oral Q12H  ? midodrine  10 mg Oral Q M,W,F-HD  ? multivitamin  1 tablet Oral QHS  ? pantoprazole  40 mg Oral Q1200  ? polyethylene glycol  17 g Oral BID  ? senna-docusate  1 tablet Oral BID  ? sorbitol, milk of mag, mineral oil, glycerin (SMOG) enema  960 mL Rectal Once  ? sucroferric oxyhydroxide  1,000 mg Oral TID WC  ? thiamine injection  100 mg Intravenous Daily  ? Or  ? thiamine  100 mg Oral Daily  ? zinc sulfate  220 mg Oral BID  ? ?Continuous Infusions: ? ceFEPime (MAXIPIME) IV 2 g (02/12/22 2100)  ? sodium chloride    ? vancomycin 1,000 mg (02/12/22 1802)  ? ?PRN Meds:.acetaminophen, albuterol, docusate sodium, fentaNYL (SUBLIMAZE) injection, ondansetron **OR** ondansetron (ZOFRAN) IV,  oxyCODONE, polyethylene glycol, sodium chloride   ?Pearson Grippe  MD ?02/14/2022, 8:25 AM ?  ?

## 2022-02-14 NOTE — Progress Notes (Signed)
Patient left for HD. Family notified. ? ?

## 2022-02-14 NOTE — Progress Notes (Addendum)
Pharmacy Antibiotic Note- follow-up ? ?[name] FARABEE Sr. is a 61 y.o. male admitted on 02/02/2022 with bacteremia secondary to foot abscess.  Pharmacy has been consulted for vancomycin and cefepime dosing. Patient has HD MWF and gets vancomycin 1 g IV post HD. VR 27. New BCx result with morganella morganii, clostridium species resistant to ampicillin, unasyn, and cefazolin. Continue all abx for 6 weeks after last I&D (stop date: 03/22/22). ? ?Plan: ?Continue vancomycin 750 mg qMWF post HD  ?Continue cefepime 2 g IV qMWF post HD ?Continue flagyl 500 mg PO BID ? ?Height: 6\' 1"  (185.4 cm) ?Weight: 116 kg (255 lb 11.7 oz) ?IBW/kg (Calculated) : 79.9 ? ?Temp (24hrs), Avg:98.5 ?F (36.9 ?C), Min:98.1 ?F (36.7 ?C), Max:98.8 ?F (37.1 ?C) ? ?Recent Labs  ?Lab 02/10/22 ?0421 02/11/22 ?3570 02/12/22 ?1779 02/13/22 ?0414 02/14/22 ?3903  ?WBC 15.4* 14.7* 14.1* 11.9* 10.5  ?CREATININE 12.50* 9.00* 11.54* 8.31* 10.92*  ?VANCORANDOM  --   --   --   --  29  ? ?  ?Estimated Creatinine Clearance: 9.7 mL/min (A) (by C-G formula based on SCr of 10.92 mg/dL (H)).   ? ?Allergies  ?Allergen Reactions  ? Sulfa Antibiotics Nausea And Vomiting  ? ? ?Antimicrobials this admission: ?Cefepime 3/20 >> [5/6] ?Metronidazole 3/20 >> [5/6] ?Vancomycin 3/20 >> [5/6] ? ?Dose adjustments this admission: ?None. ? ?Microbiology results: ?3/19 BCx: morganella morganii (resistant to amp, unasyn, and cefazolin) ?3/19 WCx morganella morganii (resistant to amp, unasyn, and cefazolin) ?3/20 MRSA PCR surgical: positive ? ?3/31- vanco random 34 mcg/mL ? ?Hold vanco dose 3/31. Repeat vanco random with AM labs on 4/3 and reduce dose to 750 mg iv qmwf post-hd ? ? ?Thank you for allowing pharmacy to be a part of this patient?s care. ? ?Jesslynn Kruck BS, PharmD, BCPS ?Clinical Pharmacist ?02/14/2022 8:26 AM ? ? ? ? ?  ? ? ?

## 2022-02-14 NOTE — Plan of Care (Signed)
?  Problem: Education: ?Goal: Knowledge of General Education information will improve ?Description: Including pain rating scale, medication(s)/side effects and non-pharmacologic comfort measures ?Outcome: Progressing ?  ?Problem: Health Behavior/Discharge Planning: ?Goal: Ability to manage health-related needs will improve ?Outcome: Progressing ?  ?Problem: Clinical Measurements: ?Goal: Ability to maintain clinical measurements within normal limits will improve ?Outcome: Progressing ?Goal: Will remain free from infection ?Outcome: Progressing ?Goal: Diagnostic test results will improve ?Outcome: Progressing ?Goal: Respiratory complications will improve ?Outcome: Progressing ?Goal: Cardiovascular complication will be avoided ?Outcome: Progressing ?  ?Problem: Activity: ?Goal: Risk for activity intolerance will decrease ?Outcome: Progressing ?  ?Problem: Nutrition: ?Goal: Adequate nutrition will be maintained ?Outcome: Progressing ?  ?Problem: Coping: ?Goal: Level of anxiety will decrease ?Outcome: Progressing ?  ?Problem: Elimination: ?Goal: Will not experience complications related to bowel motility ?Outcome: Progressing ?Goal: Will not experience complications related to urinary retention ?Outcome: Progressing ?  ?Problem: Pain Managment: ?Goal: General experience of comfort will improve ?Outcome: Progressing ?  ?Problem: Safety: ?Goal: Ability to remain free from injury will improve ?Outcome: Progressing ?  ?Problem: Skin Integrity: ?Goal: Risk for impaired skin integrity will decrease ?Outcome: Progressing ?  ?Problem: Education: ?Goal: Knowledge of disease or condition will improve ?Outcome: Progressing ?Goal: Knowledge of secondary prevention will improve (SELECT ALL) ?Outcome: Progressing ?Goal: Knowledge of patient specific risk factors will improve (INDIVIDUALIZE FOR PATIENT) ?Outcome: Progressing ?  ?Problem: Coping: ?Goal: Will verbalize positive feelings about self ?Outcome: Progressing ?Goal: Will  identify appropriate support needs ?Outcome: Progressing ?  ?Problem: Health Behavior/Discharge Planning: ?Goal: Ability to manage health-related needs will improve ?Outcome: Progressing ?  ?Problem: Self-Care: ?Goal: Ability to participate in self-care as condition permits will improve ?Outcome: Progressing ?Goal: Verbalization of feelings and concerns over difficulty with self-care will improve ?Outcome: Progressing ?Goal: Ability to communicate needs accurately will improve ?Outcome: Progressing ?  ?Problem: Nutrition: ?Goal: Risk of aspiration will decrease ?Outcome: Progressing ?Goal: Dietary intake will improve ?Outcome: Progressing ?  ?Problem: Ischemic Stroke/TIA Tissue Perfusion: ?Goal: Complications of ischemic stroke/TIA will be minimized ?Outcome: Progressing ?  ?Problem: Education: ?Goal: Knowledge of disease or condition will improve ?Outcome: Progressing ?Goal: Knowledge of secondary prevention will improve (SELECT ALL) ?Outcome: Progressing ?Goal: Individualized Educational Video(s) ?Outcome: Progressing ?  ?Problem: Coping: ?Goal: Will verbalize positive feelings about self ?Outcome: Progressing ?Goal: Will identify appropriate support needs ?Outcome: Progressing ?  ?Problem: Health Behavior/Discharge Planning: ?Goal: Ability to manage health-related needs will improve ?Outcome: Progressing ?  ?Problem: Self-Care: ?Goal: Ability to participate in self-care as condition permits will improve ?Outcome: Progressing ?Goal: Verbalization of feelings and concerns over difficulty with self-care will improve ?Outcome: Progressing ?Goal: Ability to communicate needs accurately will improve ?Outcome: Progressing ?  ?Problem: Nutrition: ?Goal: Risk of aspiration will decrease ?Outcome: Progressing ?Goal: Dietary intake will improve ?Outcome: Progressing ?  ?Problem: Education: ?Goal: Knowledge of secondary prevention will improve (SELECT ALL) ?Outcome: Progressing ?  ?

## 2022-02-15 DIAGNOSIS — J9601 Acute respiratory failure with hypoxia: Secondary | ICD-10-CM | POA: Diagnosis not present

## 2022-02-15 DIAGNOSIS — N186 End stage renal disease: Secondary | ICD-10-CM | POA: Diagnosis not present

## 2022-02-15 DIAGNOSIS — M869 Osteomyelitis, unspecified: Secondary | ICD-10-CM | POA: Diagnosis not present

## 2022-02-15 DIAGNOSIS — E871 Hypo-osmolality and hyponatremia: Secondary | ICD-10-CM

## 2022-02-15 DIAGNOSIS — I639 Cerebral infarction, unspecified: Secondary | ICD-10-CM

## 2022-02-15 DIAGNOSIS — G9341 Metabolic encephalopathy: Secondary | ICD-10-CM | POA: Diagnosis not present

## 2022-02-15 LAB — RENAL FUNCTION PANEL
Albumin: 2.2 g/dL — ABNORMAL LOW (ref 3.5–5.0)
Anion gap: 9 (ref 5–15)
BUN: 18 mg/dL (ref 6–20)
CO2: 27 mmol/L (ref 22–32)
Calcium: 8.2 mg/dL — ABNORMAL LOW (ref 8.9–10.3)
Chloride: 97 mmol/L — ABNORMAL LOW (ref 98–111)
Creatinine, Ser: 7.65 mg/dL — ABNORMAL HIGH (ref 0.61–1.24)
GFR, Estimated: 8 mL/min — ABNORMAL LOW (ref >60)
Glucose, Bld: 206 mg/dL — ABNORMAL HIGH (ref 70–99)
Phosphorus: 3 mg/dL (ref 2.5–4.6)
Potassium: 4 mmol/L (ref 3.5–5.1)
Sodium: 133 mmol/L — ABNORMAL LOW (ref 135–145)

## 2022-02-15 LAB — CBC WITH DIFFERENTIAL/PLATELET
Abs Immature Granulocytes: 0.09 10*3/uL — ABNORMAL HIGH (ref 0.00–0.07)
Basophils Absolute: 0.1 10*3/uL (ref 0.0–0.1)
Basophils Relative: 1 %
Eosinophils Absolute: 0.6 10*3/uL — ABNORMAL HIGH (ref 0.0–0.5)
Eosinophils Relative: 5 %
HCT: 26.9 % — ABNORMAL LOW (ref 39.0–52.0)
Hemoglobin: 8.5 g/dL — ABNORMAL LOW (ref 13.0–17.0)
Immature Granulocytes: 1 %
Lymphocytes Relative: 14 %
Lymphs Abs: 1.6 10*3/uL (ref 0.7–4.0)
MCH: 30.4 pg (ref 26.0–34.0)
MCHC: 31.6 g/dL (ref 30.0–36.0)
MCV: 96.1 fL (ref 80.0–100.0)
Monocytes Absolute: 1.6 10*3/uL — ABNORMAL HIGH (ref 0.1–1.0)
Monocytes Relative: 14 %
Neutro Abs: 7.3 10*3/uL (ref 1.7–7.7)
Neutrophils Relative %: 65 %
Platelets: 444 10*3/uL — ABNORMAL HIGH (ref 150–400)
RBC: 2.8 MIL/uL — ABNORMAL LOW (ref 4.22–5.81)
RDW: 17.8 % — ABNORMAL HIGH (ref 11.5–15.5)
WBC: 11.2 10*3/uL — ABNORMAL HIGH (ref 4.0–10.5)
nRBC: 0 % (ref 0.0–0.2)

## 2022-02-15 LAB — GLUCOSE, CAPILLARY
Glucose-Capillary: 170 mg/dL — ABNORMAL HIGH (ref 70–99)
Glucose-Capillary: 230 mg/dL — ABNORMAL HIGH (ref 70–99)
Glucose-Capillary: 205 mg/dL — ABNORMAL HIGH (ref 70–99)
Glucose-Capillary: 222 mg/dL — ABNORMAL HIGH (ref 70–99)

## 2022-02-15 NOTE — TOC Progression Note (Signed)
Transition of Care (TOC) - Progression Note  ? ? ?Patient Details  ?Name: [name] GOODELL Sr. ?MRN: [id number] ?Date of Birth: 03/30/1961 ? ?Transition of Care (TOC) CM/SW Contact  ?Amador Cunas, [place] ?Phone Number: ?02/15/2022, 9:44 AM ? ?Clinical Narrative:   SW spoke with pt's dtr Valinda Party [phone] re SNF choice. Dtr plans to [facility] today and will update SW re choice. Pt currently active HD [facility] MWF. Will need to change HD center to [facility] if dtr elects [facility] SNF location. SW will provide updates as available.   ? ?Wandra Feinstein, MSW, LCSW ?[phone] (coverage) ? ? ? ? ? ?Expected Discharge Plan: [place] ?Barriers to Discharge: Continued Medical Work up ? ?Expected Discharge Plan and Services ?Expected Discharge Plan: [place] ?  ?  ?  ?  ?                ?  ?  ?  ?  ?  ?  ?  ?  ?  ?  ? ? ?Social Determinants of Health (SDOH) Interventions ?  ? ?Readmission Risk Interventions ?   ? View : No data to display.  ?  ?  ?  ? ? ?

## 2022-02-15 NOTE — Progress Notes (Signed)
?PROGRESS NOTE ? ? ? ?[name] SISEMORE Sr.  XIP:[id number] DOB: 1961-08-05 DOA: 02/02/2022 ?PCP: [name]  ? ? ?Chief Complaint  ?Patient presents with  ? Altered Mental Status  ? ? ?Brief Narrative:  ?61 y/o male history of DM, ESRD on HD, admitted to [name] with fever, acute encephalopathy, sepsis and right foot infection.  He has a history of prior right fifth metatarsal amputation performed many years ago in [place].  Plain films of his foot indicate swelling and subcutaneous gas without any obvious bony erosion. [facility] to [facility] and [facility] transferred to [facility].  Podiatry consulted, he was taken emergently to [name] on 3/19.  Postoperatively he has had persistent lethargy and was taken to [name] 3/19 evening.  He was placed on Narcan drip for presumed lethargy due to opioid use however he only got 5 mg of oxycodone.  Narcan drip was discontinued on 3/20 and he was transferred out of [name]. He continued to have altered mental status and was found to have findings consistent with acute strokes.  Neurology was consulted and suspects related to hypotension.  His mental status has gradually improved.  Blood cultures were positive with morganella and clostridium related to his foot infection.  ID has been consulted and recommended 6 weeks of abx after last debridement.  He went back to OR on 3/25 for I&D with podiatry.  Hospitalization c/b malfunctioning L AV fistula, he's s/p fistulogram with vascular on 3/23.  At this time, plan is for d/c to SNF when bed available.  He had nausea/vomiting of unclear etiology 3/28, workup pending. ? ?See below for additional details ?   ? ? ?Assessment & Plan: ? Principal Problem: ?  Osteomyelitis ([place]) ?Active Problems: ?  Severe sepsis ([place]) ?  Nausea & vomiting ?  Hypotension ?  Acute metabolic encephalopathy ?  Stroke [facility]) ?  ESRD (end stage renal disease) ([place]) ?  DM2 (diabetes mellitus, type 2) ([place]) ?  HTN  (hypertension) ?  Chronic systolic CHF (congestive heart failure) ([place]) ?  Acute respiratory failure with hypoxia ([place]) ?  Protein-calorie malnutrition, mild ([place]) ?  GERD (gastroesophageal reflux disease) ?  Carotid stenosis ?  Hyponatremia ?  Hyperkalemia ?  High anion gap metabolic acidosis ?  Anemia of chronic renal failure ?  Necrotizing soft tissue infection ?  Cellulitis in diabetic foot ([place]) ?  Bacteremia ?  Right foot infection ? ? ? 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 ?Appreciate ID assistance, continuing vanc, flagyl, and cefepime - low suspicion for endocarditis per ID, TTE without eivdence of valvular vegetations on TTE (per ID, "do not feel strongly regarding getting TEE from ID standpoint given low grade bacteremia with GNR/GPR with right  foot being source, would defer need of TEE to neurology if helpful for stroke workup - neurology did not recommend from their standpoint).  Recommending 6 weeks of abx after last debridement intervention (from 3/25).--Vancomycin/cefepime during HD and p.o. Flagyl ? ? ?Hypotension ?Post op, likely multifactorial, blood loss,  meds ?He was weaned off pressors after small bolus post op 3/25 ?Will continue to trend Hb/hct ?Blood pressure stable and improving. ?Continue midodrine 10 mg pre HD ? ?Nausea & vomiting ?Unclear etiology ?Likely secondary to constipation/fecal impaction. ?KUB with stool in rectosigmoid concerning for constipation/fecal impaction ?Disimpaction/enema ordered with good results. ?-Clinical improvement. ?-Patient denies any further nausea or vomiting. ?-Patient diet advanced to a soft diet which she is tolerating. ?-Continue current bowel regimen of MiraLAX twice daily, Senokot-S twice daily. ?-IV antiemetics as needed.  Supportive care. ? ?Stroke [facility]) ?MRI brain 3/22 with numerous punctate acute infarctions throughout both cerebral hemispheres, primarily affecting [name] deep brain ?Echo with Ef 45-50%, global hypokinesis, RVSF mildly reduced ?CTA neck 3/20 without LVO or hemodynamically significant stenosis of intracranial arteries, did have 70% stenosis for R ICA due to mixed density atherosclerosis ?CT venogram normal ?LDL 46, A1c 12.2 ?Appreciate neurology recommendations - suspect likely 2/2 hypoperfusion with intermittent hypotension.  Aspirin 81 mg daily.  No statin at this time, LDL at goal. ?Will need outpatient follow-up with neurology. ? ? ?Acute metabolic encephalopathy ?Related to stroke as well as infection and acute hospitalization ?Improving clinically. 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 ? ? ?ESRD (end stage renal disease) ([place]) ?Per renal ?Being evaluated outpatient for possible transplant ?S/p L arm fistulogram with vascular 3/23 -> L central angioplasty innominate and subclavian vein, L peripheral angioplasty cephalic vein ?-Patient with hemodialysis Monday Wednesday Friday schedule.   ?-Per  nephrology. ? ?DM2 (diabetes mellitus, type 2) ([place]) ?Hemoglobimn a1c 12.2 (02/02/2022). ?CBG 170 this morning. ?Continue Semglee to 20 units daily.   ?-SSI.  ? ? ?HTN (hypertension) ?Intermittent hypotension, concern that this may have resulted in watershed infarcts. ?Continue to hold antihypertensive medications. ?Midodrine pre-HD ? ?Acute respiratory failure with hypoxia ([place]) ?Currently has been weaned to RA with sats of 98 to 100%, continue to monitor ?CXR 3/20 without frank edema ? ?Chronic systolic CHF (congestive heart failure) ([place]) ?EF 45% per echo 05/2021 ?Echo 3/22 with EF 45-50% ?Fluid management per nephrology. ? ?Carotid stenosis ?-Outpatient vascular follow up ? ?GERD (gastroesophageal reflux disease) ?- PPI, Pepcid. ? ?Protein-calorie malnutrition, mild ([place]) ?- Nutritional supplementation. ? ?Necrotizing soft tissue infection ?- See above osteomyelitis/sepsis ? ?Anemia of chronic renal failure ?- H&H stable at 8.5. ? ?Hyperkalemia ?- On hemodialysis. ?-Potassium of 4.0 today ? ?Hyponatremia ?- Improving on HD. ?-Sodium at 133. ? ? ? ? ?  ? ? ?DVT prophylaxis: Heparin ?Code Status: Full ?Family Communication: Updated patient.  No family at bedside. ?Disposition: Likely SNF hopefully on Monday, 02/17/2022. ? ?Status is: Inpatient ?Remains inpatient appropriate because: Severity of illness. ?  ?Consultants:  ?Podiatry: Dr. Blenda Mounts 02/02/2022 ?Nephrology: Dr. Joylene Grapes 02/02/2022 ?PCCM: Dr.Ismail 02/03/2022 ?Infectious disease: Dr.Manandhar 02/03/2022 ?Neurology: Dr.Collins 02/04/2022 ?Vascular surgery: Dr.Brabham 02/05/2022 ? ?Procedures:  ?CT head 02/02/2022 ?CT angiogram head and neck 02/03/2022 ?Plain films of [name] right foot 02/02/2022, 02/08/2022 ?Chest x-ray 02/03/2022 ?Abdominal films 02/04/2022, 02/11/2022 ?MRI right foot/MRI right ankle 02/06/2022 ?2D echo 02/05/2022 ?MRI brain 02/04/2022 ?Left brachiocephalic AV fistula cannulation under ultrasound guidance/lifestyle  fistulogram including central venogram/left  central angioplasty innominate and subclavian vein/left peripheral angioplasty cephalic vein per Dr. Carlis Abbott vascular surgeon 02/06/2022 ?CT venogram head 02/03/2022 ? ?Antimicrobials:  ?Anti-infectives (From admission, onwa

## 2022-02-15 NOTE — Progress Notes (Signed)
?[facility] ?Progress Note  ? ?Subjective: No interval events.  HD yesterday 1.5L removed.  Seen in room. ? ?Objective ?Vitals:  ? 02/14/22 2257 02/15/22 0337 02/15/22 0726 02/15/22 1120  ?BP: (!) 156/61 (!) 141/61 (!) 156/69 138/63  ?Pulse: 85 80 80 81  ?Resp: 18 18 18 16   ?Temp: 99.3 ?F (37.4 ?C) 99.1 ?F (37.3 ?C) 98.6 ?F (37 ?C) 98.6 ?F (37 ?C)  ?TempSrc: Oral Oral Oral Oral  ?SpO2: 97% 98% 98% 100%  ?Weight:      ?Height:      ? ?Physical Exam ?General: n NAD ?Heart: S1,S2 RRR No M/R/G ?Lungs: No WOB. CTAB ?Abdomen: Obese, NT, ND ?Extremities: No LE edema. [place] intact on foot.  ?Dialysis Access: L AVF +[place] ? ?Additional Objective ?Labs: ?Basic Metabolic Panel: ?Recent Labs  ?Lab 02/13/22 ?0414 02/14/22 ?4128 02/15/22 ?0245  ?NA 134* 131* 133*  ?K 4.3 4.1 4.0  ?CL 96* 94* 97*  ?CO2 26 26 27   ?GLUCOSE 227* 170* 206*  ?BUN 22* 29* 18  ?CREATININE 8.31* 10.92* 7.65*  ?CALCIUM 8.3* 8.6* 8.2*  ?PHOS 4.0 4.8* 3.0  ? ? ?Liver Function Tests: ?Recent Labs  ?Lab 02/09/22 ?0751 02/10/22 ?0421 02/11/22 ?7867 02/12/22 ?6720 02/13/22 ?0414 02/14/22 ?9470 02/15/22 ?0245  ?AST 25 22 24   --   --   --   --   ?ALT 12 18 19   --   --   --   --   ?ALKPHOS 68 72 63  --   --   --   --   ?BILITOT 0.9 0.7 0.9  --   --   --   --   ?PROT 6.2* 6.9 6.7  --   --   --   --   ?ALBUMIN 2.2* 2.4* 2.3*   < > 2.2* 2.2* 2.2*  ? < > = values in this interval not displayed.  ? ? ?No results for input(s): LIPASE, AMYLASE in the last 168 hours. ?CBC: ?Recent Labs  ?Lab 02/11/22 ?9628 02/12/22 ?3662 02/13/22 ?0414 02/14/22 ?9476 02/15/22 ?0245  ?WBC 14.7* 14.1* 11.9* 10.5 11.2*  ?NEUTROABS 10.0* 10.3* 8.1* 6.8 7.3  ?HGB 8.4* 8.4* 8.9* 8.3* 8.5*  ?HCT 25.8* 25.7* 27.4* 26.3* 26.9*  ?MCV 94.5 95.5 96.1 94.9 96.1  ?PLT 441* 464* 428* 455* 444*  ? ? ?Blood Culture ?   ?Component Value Date/Time  ? SDES BLOOD RIGHT HAND 02/07/2022 0648  ? SPECREQUEST  02/07/2022 0648  ?  BOTTLES DRAWN AEROBIC AND ANAEROBIC Blood Culture results may not be  optimal due to an inadequate volume of blood received in culture bottles  ? CULT  02/07/2022 0648  ?  NO GROWTH 5 DAYS ?Performed at [facility], [place] [address] [place], [place] [address] ?  ? REPTSTATUS 02/12/2022 FINAL 02/07/2022 0648  ? ? ?Cardiac Enzymes: ?No results for input(s): CKTOTAL, CKMB, CKMBINDEX, TROPONINI in the last 168 hours. ?CBG: ?Recent Labs  ?Lab 02/14/22 ?1306 02/14/22 ?1716 02/14/22 ?2116 02/15/22 ?3546 02/15/22 ?1118  ?GLUCAP 176* 191* 207* 170* 230*  ? ? ?Iron Studies: No results for input(s): IRON, TIBC, TRANSFERRIN, FERRITIN in the last 72 hours. ?@lablastinr3 @ ?Studies/Results: ?No results found. ?Medications: ? ceFEPime (MAXIPIME) IV 2 g (02/14/22 2134)  ? sodium chloride    ? [START ON 02/17/2022] vancomycin 750 mg (02/14/22 1056)  ? ? (feeding supplement) PROSource Plus  30 mL Oral BID BM  ? vitamin C  500 mg Oral Daily  ? aspirin EC  81 mg Oral Daily  ?  Chlorhexidine Gluconate Cloth  6 each Topical Q0600  ? darbepoetin (ARANESP) injection - DIALYSIS  100 mcg Intravenous Q Wed-HD  ? famotidine  20 mg Oral Daily  ? feeding supplement (NEPRO CARB STEADY)  237 mL Oral BID BM  ? heparin  5,000 Units Subcutaneous Q8H  ? insulin aspart  0-15 Units Subcutaneous TID WC  ? insulin aspart  0-5 Units Subcutaneous QHS  ? insulin glargine-yfgn  20 Units Subcutaneous Daily  ? metroNIDAZOLE  500 mg Oral Q12H  ? midodrine  10 mg Oral Q M,W,F-HD  ? multivitamin  1 tablet Oral QHS  ? pantoprazole  40 mg Oral Q1200  ? polyethylene glycol  17 g Oral BID  ? senna-docusate  1 tablet Oral BID  ? sorbitol, milk of mag, mineral oil, glycerin (SMOG) enema  960 mL Rectal Once  ? sucroferric oxyhydroxide  1,000 mg Oral TID WC  ? thiamine injection  100 mg Intravenous Daily  ? Or  ? thiamine  100 mg Oral Daily  ? zinc sulfate  220 mg Oral BID  ? ? ? ?Dialysis Orders:  MWF [facility] ?4h 32min  450/500    117.8kg   2/ 2.5Ca bath L AVF ?--Hep 6000 units IV TIW ? - on 3/19 > hep B Ag neg, and Hep B Ab's high/  protective ? - calcitriol 1.82mcg PO qHD-last 01/31/22 ?  ?Assessment/Plan: ?R foot osteomyelitis- on IV vanc/cefepime/Flagyl. + Morganella bacteremia 2/2 foot abscess. S/p R foot I&D on 3/19 by podiatry and another I&D done 3/25 now with wound vac. Will need 6 weeks ABX for attempted limb salvage from 3/25.  -- Vanc/cefepime + PO Flagyl ?Acute metabolic encephalopathy: Improved.  ?CVA: Neurology consulted. MRI done 02/04/2022 Numerous bilat punctate acute infarctions. Per primary. Avoid hypotension ?ESRD - on HD MWF. Cont on schedule: 2K, no heparin, AVF, 450/800, 2.5 Ca, keep SBP > 120. Next HD 04/3 ?Hypertension/volume  - Blood pressures acceptable; euvolemic on exam. UF as tolerated. Avoid hypotension during HD. Keep SBP >120. Very much under OP EDW. Will need EDW reset on discharge.  ?Anemia of CKD - Hb drifting down, ESA darbe cont qWed, ?Secondary Hyperparathyroidism -  CCa okay. Continue VDRA with HD. Phos stable ?HD access: L AVF w/ thinning, ulcerated area at the lower portion. Stick away from area. VVS consulted and went for fistulogram on 3/23 by VVS > they did L central venoplasty to innominate and subclavian veins, and peripheral venoplasty to the cephalic vein.  ? ?Rexene Agent, MD  ?02/15/2022, 11:42 AM  ?[facility] ?[phone] ? ? ?  ? ?

## 2022-02-16 DIAGNOSIS — N186 End stage renal disease: Secondary | ICD-10-CM | POA: Diagnosis not present

## 2022-02-16 DIAGNOSIS — M869 Osteomyelitis, unspecified: Secondary | ICD-10-CM | POA: Diagnosis not present

## 2022-02-16 DIAGNOSIS — G9341 Metabolic encephalopathy: Secondary | ICD-10-CM | POA: Diagnosis not present

## 2022-02-16 DIAGNOSIS — J9601 Acute respiratory failure with hypoxia: Secondary | ICD-10-CM | POA: Diagnosis not present

## 2022-02-16 LAB — RENAL FUNCTION PANEL
Albumin: 2.3 g/dL — ABNORMAL LOW (ref 3.5–5.0)
Anion gap: 11 (ref 5–15)
BUN: 27 mg/dL — ABNORMAL HIGH (ref 6–20)
CO2: 25 mmol/L (ref 22–32)
Calcium: 8.5 mg/dL — ABNORMAL LOW (ref 8.9–10.3)
Chloride: 98 mmol/L (ref 98–111)
Creatinine, Ser: 10.05 mg/dL — ABNORMAL HIGH (ref 0.61–1.24)
GFR, Estimated: 5 mL/min — ABNORMAL LOW (ref >60)
Glucose, Bld: 218 mg/dL — ABNORMAL HIGH (ref 70–99)
Phosphorus: 3.5 mg/dL (ref 2.5–4.6)
Potassium: 4.9 mmol/L (ref 3.5–5.1)
Sodium: 134 mmol/L — ABNORMAL LOW (ref 135–145)

## 2022-02-16 LAB — CBC WITH DIFFERENTIAL/PLATELET
Abs Immature Granulocytes: 0.06 10*3/uL (ref 0.00–0.07)
Basophils Absolute: 0.1 10*3/uL (ref 0.0–0.1)
Basophils Relative: 1 %
Eosinophils Absolute: 0.6 10*3/uL — ABNORMAL HIGH (ref 0.0–0.5)
Eosinophils Relative: 6 %
HCT: 27.7 % — ABNORMAL LOW (ref 39.0–52.0)
Hemoglobin: 9 g/dL — ABNORMAL LOW (ref 13.0–17.0)
Immature Granulocytes: 1 %
Lymphocytes Relative: 17 %
Lymphs Abs: 1.9 10*3/uL (ref 0.7–4.0)
MCH: 31.3 pg (ref 26.0–34.0)
MCHC: 32.5 g/dL (ref 30.0–36.0)
MCV: 96.2 fL (ref 80.0–100.0)
Monocytes Absolute: 1.5 10*3/uL — ABNORMAL HIGH (ref 0.1–1.0)
Monocytes Relative: 14 %
Neutro Abs: 6.7 10*3/uL (ref 1.7–7.7)
Neutrophils Relative %: 61 %
Platelets: 503 10*3/uL — ABNORMAL HIGH (ref 150–400)
RBC: 2.88 MIL/uL — ABNORMAL LOW (ref 4.22–5.81)
RDW: 18 % — ABNORMAL HIGH (ref 11.5–15.5)
WBC: 10.7 10*3/uL — ABNORMAL HIGH (ref 4.0–10.5)
nRBC: 0 % (ref 0.0–0.2)

## 2022-02-16 LAB — GLUCOSE, CAPILLARY
Glucose-Capillary: 219 mg/dL — ABNORMAL HIGH (ref 70–99)
Glucose-Capillary: 216 mg/dL — ABNORMAL HIGH (ref 70–99)
Glucose-Capillary: 159 mg/dL — ABNORMAL HIGH (ref 70–99)
Glucose-Capillary: 151 mg/dL — ABNORMAL HIGH (ref 70–99)

## 2022-02-16 MED ORDER — LIDOCAINE VISCOUS HCL 2 % MT SOLN
15.0000 mL | Freq: Four times a day (QID) | OROMUCOSAL | Status: DC | PRN
  Administered 2022-02-16: 15 mL via ORAL
  Filled 2022-02-16: qty 15

## 2022-02-16 MED ORDER — ALUM & MAG HYDROXIDE-SIMETH 200-200-20 MG/5ML PO SUSP: 30.0000 mL | Freq: Four times a day (QID) | ORAL | Status: DC | PRN

## 2022-02-16 MED ORDER — HYDRALAZINE HCL 20 MG/ML IJ SOLN
5.0000 mg | Freq: Four times a day (QID) | INTRAMUSCULAR | Status: DC | PRN
  Filled 2022-02-16: qty 1

## 2022-02-16 MED ORDER — ALUM & MAG HYDROXIDE-SIMETH 200-200-20 MG/5ML PO SUSP
30.0000 mL | Freq: Four times a day (QID) | ORAL | Status: DC | PRN
Start: 2022-02-16 — End: 2022-02-20

## 2022-02-16 NOTE — TOC Progression Note (Signed)
Transition of Care (TOC) - Progression Note  ? ? ?Patient Details  ?Name: [name] STUARD Sr. ?MRN: [id number] ?Date of Birth: 02/14/1961 ? ?Transition of Care (TOC) CM/SW Contact  ?Amador Cunas,  ?Phone Number: ?02/16/2022, 11:34 AM ? ?Clinical Narrative:   voicemail left for pt's dtr [name] [phone] requesting return re SNF choice. Dtr was to [facility] [facility] yesterday. EDD is Monday per MD. SW will provide updates as available.  ? ?Wandra Feinstein, MSW, LCSW ?[phone] (coverage) ? ? ? ? ? ?Expected Discharge Plan: [place] ?Barriers to Discharge: Continued Medical Work up ? ?Expected Discharge Plan and Services ?Expected Discharge Plan: [place] ?  ?  ?  ?  ?                ?  ?  ?  ?  ?  ?  ?  ?  ?  ?  ? ? ?Social Determinants of Health (SDOH) Interventions ?  ? ?Readmission Risk Interventions ?   ? View : No data to display.  ?  ?  ?  ? ? ?

## 2022-02-16 NOTE — Progress Notes (Addendum)
?PROGRESS NOTE ? ? ? ?[name] KYNARD Sr.  FIE:[id number] DOB: [birth year] DOA: 02/02/2022 ?PCP: [facility]  ? ? ?Chief Complaint  ?Patient presents with  ? Altered Mental Status  ? ? ?Brief Narrative:  ?61 y/o male history of DM, ESRD on HD, admitted to the hospital with fever, acute encephalopathy, sepsis and right foot infection.  He has a history of prior right fifth metatarsal amputation performed many years ago in [place].  Plain films of his foot indicate swelling and subcutaneous gas without any obvious bony erosion. [facility] presented to [facility] and [facility] transferred to [facility].  Podiatry consulted, he was taken emergently to the [facility] on 3/19.  Postoperatively he has had persistent lethargy and was taken to the ICU 3/19 evening.  He was placed on Narcan drip for presumed lethargy due to opioid use however he only got 5 mg of oxycodone.  Narcan drip was discontinued on 3/20 and he was transferred out of the ICU. He continued to have altered mental status and was found to have findings consistent with acute strokes.  Neurology was consulted and suspects related to hypotension.  His mental status has gradually improved.  Blood cultures were positive with morganella and clostridium related to his foot infection.  ID has been consulted and recommended 6 weeks of abx after last debridement.  He went back to OR on 3/25 for I&D with podiatry.  Hospitalization c/b malfunctioning L AV fistula, he's s/p fistulogram with vascular on 3/23.  At this time, plan is for d/c to SNF when bed available.  He had nausea/vomiting of unclear etiology 3/28, workup pending. ? ?See below for additional details ?   ? ? ?Assessment & Plan: ? Principal Problem: ?  Osteomyelitis ([place]) ?Active Problems: ?  Severe sepsis ([place]) ?  Nausea & vomiting ?  Hypotension ?  Acute metabolic encephalopathy ?  Stroke [facility]) ?  ESRD (end stage renal disease) ([place]) ?  DM2 (diabetes mellitus, type 2) ([place]) ?  HTN  (hypertension) ?  Chronic systolic CHF (congestive heart failure) ([place]) ?  Acute respiratory failure with hypoxia ([place]) ?  Protein-calorie malnutrition, mild ([place]) ?  GERD (gastroesophageal reflux disease) ?  Carotid stenosis ?  Hyponatremia ?  Hyperkalemia ?  High anion gap metabolic acidosis ?  Anemia of chronic renal failure ?  Necrotizing soft tissue infection ?  Cellulitis in diabetic foot ([place]) ?  Bacteremia ?  Right foot infection ? ? ? ?Assessment and Plan: ?Severe sepsis ([place]) ?Due to osteo/cellulitis of R foot ?S/p OR 3/19 for R foot I&D and revision of 5th ray amputation ?RLE ABI within normal range ?Surgical culture right foot wound growing enterococcus faecalis, bacteroides thetaiotaomicron, and morganella morganii  ?Surgical culture residual 5th metatarsal amputation showing morganella ?Blood cultures from 3/19 with morganella morganii and clostridium species  ?MRI foot/ankle right with soft tissue ulceration of lateral midfoot just lateral to postsurgical remaining base of 5th metatarsal and base of 4th metatarsal, marrow edema within base of 5th greater than lateral base of 4th metatarsals concerning for acute osteomyelitis (see report) ?S/p I&D with removal of residual bone at 4-5 metatarsals on 3/25 ?Wound vac placed 3/28 ?Surgical pathology (3/25) with acute osteomyelitis and focal acute cellulitis. ?Appreciate ID assistance, continuing vanc, flagyl, and cefepime - low suspicion for endocarditis per ID, TTE without eivdence of valvular vegetations on TTE (per ID, "do not feel strongly regarding getting TEE from ID standpoint given low grade bacteremia with GNR/GPR with right  foot being source, would defer need of TEE to neurology if helpful for stroke workup - neurology did not recommend from their standpoint).  Recommending 6 weeks of abx after last debridement intervention (from 3/25).--Vancomycin/cefepime during HD and p.o. Flagyl ? ? ?Hypotension ?Post op, likely multifactorial, blood loss,  meds ?He was weaned off pressors after small bolus post op 3/25 ?Will continue to trend Hb/hct ?Blood pressure stable and improving and now starting to trend up. ?Continue midodrine 10 mg pre HD ? ?Nausea & vomiting ?Unclear etiology ?Likely secondary to constipation/fecal impaction. ?KUB with stool in rectosigmoid concerning for constipation/fecal impaction ?Disimpaction/enema ordered with good results. ?-Clinical improvement. ?-Patient denies any further nausea or vomiting. ?-Patient diet advanced to a soft diet which he is tolerating. ?-Continue current bowel regimen of MiraLAX twice daily, Senokot-S twice daily. ?-IV antiemetics as needed.  Supportive care. ? ?Stroke [facility]) ?MRI brain 3/22 with numerous punctate acute infarctions throughout both cerebral hemispheres, primarily affecting the deep brain ?Echo with Ef 45-50%, global hypokinesis, RVSF mildly reduced ?CTA neck 3/20 without LVO or hemodynamically significant stenosis of intracranial arteries, did have 70% stenosis for R ICA due to mixed density atherosclerosis ?CT venogram normal ?LDL 46, A1c 12.2 ?Appreciate neurology recommendations - suspect likely 2/2 hypoperfusion with intermittent hypotension.  Aspirin 81 mg daily.  No statin at this time, LDL at goal. ?Will need outpatient follow-up with neurology. ? ? ?Acute metabolic encephalopathy ?Related to stroke as well as infection and acute hospitalization ?Improving clinically. 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 ? ? ?ESRD (end stage renal disease) ([place]) ?Per renal ?Being evaluated outpatient for possible transplant ?S/p L arm fistulogram with vascular 3/23 -> L central angioplasty innominate and subclavian vein, L peripheral angioplasty cephalic vein ?-Patient with hemodialysis Monday Wednesday  Friday schedule.   ?-Per nephrology. ? ?DM2 (diabetes mellitus, type 2) ([place]) ?Hemoglobimn a1c 12.2 (02/02/2022). ?CBG 219 this morning. ?Continue Semglee to 20 units daily.   ?-SSI.  ? ? ?HTN (hypertension) ?Intermittent hypotension, concern that this may have resulted in watershed infarcts. ?-Blood pressure slowly rising today. ?-Place on as needed hydralazine ?Midodrine pre-HD ? ?Acute respiratory failure with hypoxia ([place]) ?Currently has been weaned to RA with sats of 93 to 97%, continue to monitor ?CXR 3/20 without frank edema ? ?Chronic systolic CHF (congestive heart failure) ([place]) ?EF 45% per echo 05/2021 ?Echo 3/22 with EF 45-50% ?Fluid management per nephrology. ? ?Carotid stenosis ?-Outpatient vascular follow up ? ?GERD (gastroesophageal reflux disease) ?- PPI, Pepcid. ?-GI cocktail as needed. ? ?Protein-calorie malnutrition, mild ([place]) ?- Nutritional supplementation. ? ?Necrotizing soft tissue infection ?- See above osteomyelitis/sepsis ? ?Anemia of chronic renal failure ?- H&H stable at 9. ? ?Hyperkalemia ?- On hemodialysis. ?-Potassium of 4.9 today ? ?Hyponatremia ?- Improving on HD. ?-Sodium at 134. ? ? ? ? ?  ? ? ?DVT prophylaxis: Heparin ?Code Status: Full ?Family Communication: Updated patient.  Updated brother at bedside.  ?Disposition: Likely SNF hopefully on Monday, 02/17/2022. ? ?Status is: Inpatient ?Remains inpatient appropriate because: Severity of illness. ?  ?Consultants:  ?Podiatry: Dr. Blenda Mounts 02/02/2022 ?Nephrology: Dr. Joylene Grapes 02/02/2022 ?PCCM: Dr.Ismail 02/03/2022 ?Infectious disease: Dr.Manandhar 02/03/2022 ?Neurology: Dr.Collins 02/04/2022 ?Vascular surgery: Dr.Brabham 02/05/2022 ? ?Procedures:  ?CT head 02/02/2022 ?CT angiogram head and neck 02/03/2022 ?Plain films of the right foot 02/02/2022, 02/08/2022 ?Chest x-ray 02/03/2022 ?Abdominal films 02/04/2022, 02/11/2022 ?MRI right foot/MRI right  ankle 02/06/2022 ?2D echo 02/05/2022 ?MRI brain 02/04/2022 ?Left brachiocephalic AV fistula cannulation under  ultrasound guidance/lifestyle fistulogram including central venogram/left central angioplasty innominate and subclavian vein/left peripheral angioplasty cephalic vein per Dr. Carlis Abbott vascular surgeon 02/06/2022 ?CT ve

## 2022-02-16 NOTE — Progress Notes (Signed)
?[facility] ?Progress Note  ? ?Subjective: No interval events.  Awaiting SNF. Seen in room. Brother visiting ? ?Objective ?Vitals:  ? 02/15/22 1927 02/15/22 2300 02/16/22 0408 02/16/22 0740  ?BP: (!) 146/75 (!) 168/79 (!) 161/63 (!) 154/79  ?Pulse: 83 87 86 81  ?Resp: 18 20 18 16   ?Temp: 98.8 ?F (37.1 ?C) 98.7 ?F (37.1 ?C) 98.1 ?F (36.7 ?C) 98.6 ?F (37 ?C)  ?TempSrc: Oral Oral Oral Oral  ?SpO2: 99% 100% 100% 97%  ?Weight:      ?Height:      ? ?Physical Exam ?General: NAD ?Heart: S1,S2 RRR No M/R/G ?Lungs: No WOB. CTAB ?Abdomen: Obese, NT, ND ?Extremities: No LE edema. [place] intact on foot.  ?Dialysis Access: L AVF +Thrill ? ?Additional Objective ?Labs: ?Basic Metabolic Panel: ?Recent Labs  ?Lab 02/14/22 ?1829 02/15/22 ?9371 02/16/22 ?0238  ?NA 131* 133* 134*  ?K 4.1 4.0 4.9  ?CL 94* 97* 98  ?CO2 26 27 25   ?GLUCOSE 170* 206* 218*  ?BUN 29* 18 27*  ?CREATININE 10.92* 7.65* 10.05*  ?CALCIUM 8.6* 8.2* 8.5*  ?PHOS 4.8* 3.0 3.5  ? ? ?Liver Function Tests: ?Recent Labs  ?Lab 02/10/22 ?0421 02/11/22 ?6967 02/12/22 ?8938 02/14/22 ?1017 02/15/22 ?5102 02/16/22 ?0238  ?AST 22 24  --   --   --   --   ?ALT 18 19  --   --   --   --   ?ALKPHOS 72 63  --   --   --   --   ?BILITOT 0.7 0.9  --   --   --   --   ?PROT 6.9 6.7  --   --   --   --   ?ALBUMIN 2.4* 2.3*   < > 2.2* 2.2* 2.3*  ? < > = values in this interval not displayed.  ? ? ?No results for input(s): LIPASE, AMYLASE in the last 168 hours. ?CBC: ?Recent Labs  ?Lab 02/12/22 ?0923 02/13/22 ?0414 02/14/22 ?5852 02/15/22 ?7782 02/16/22 ?0238  ?WBC 14.1* 11.9* 10.5 11.2* 10.7*  ?NEUTROABS 10.3* 8.1* 6.8 7.3 6.7  ?HGB 8.4* 8.9* 8.3* 8.5* 9.0*  ?HCT 25.7* 27.4* 26.3* 26.9* 27.7*  ?MCV 95.5 96.1 94.9 96.1 96.2  ?PLT 464* 428* 455* 444* 503*  ? ? ?Blood Culture ?   ?Component Value Date/Time  ? SDES BLOOD RIGHT HAND 02/07/2022 0648  ? SPECREQUEST  02/07/2022 0648  ?  BOTTLES DRAWN AEROBIC AND ANAEROBIC Blood Culture results may not be optimal due to an inadequate  volume of blood received in culture bottles  ? CULT  02/07/2022 0648  ?  NO GROWTH 5 DAYS ?Performed at [facility], [facility] [address] [place], [place] [address] ?  ? REPTSTATUS 02/12/2022 FINAL 02/07/2022 0648  ? ? ?Cardiac Enzymes: ?No results for input(s): CKTOTAL, CKMB, CKMBINDEX, TROPONINI in the last 168 hours. ?CBG: ?Recent Labs  ?Lab 02/15/22 ?6144 02/15/22 ?1118 02/15/22 ?1642 02/15/22 ?2132 02/16/22 ?3154  ?GLUCAP 170* 230* 205* 222* 219*  ? ? ?Iron Studies: No results for input(s): IRON, TIBC, TRANSFERRIN, FERRITIN in the last 72 hours. ?@lablastinr3 @ ?Studies/Results: ?No results found. ?Medications: ? ceFEPime (MAXIPIME) IV 2 g (02/14/22 2134)  ? sodium chloride    ? [START ON 02/17/2022] vancomycin 750 mg (02/14/22 1056)  ? ? (feeding supplement) PROSource Plus  30 mL Oral BID BM  ? vitamin C  500 mg Oral Daily  ? aspirin EC  81 mg Oral Daily  ? Chlorhexidine Gluconate Cloth  6 each Topical  [id number]  ? darbepoetin (ARANESP) injection - DIALYSIS  100 mcg Intravenous Q Wed-HD  ? famotidine  20 mg Oral Daily  ? feeding supplement (NEPRO CARB STEADY)  237 mL Oral BID BM  ? heparin  5,000 Units Subcutaneous Q8H  ? insulin aspart  0-15 Units Subcutaneous TID WC  ? insulin aspart  0-5 Units Subcutaneous QHS  ? insulin glargine-yfgn  20 Units Subcutaneous Daily  ? metroNIDAZOLE  500 mg Oral Q12H  ? midodrine  10 mg Oral Q M,W,F-HD  ? multivitamin  1 tablet Oral QHS  ? pantoprazole  40 mg Oral Q1200  ? polyethylene glycol  17 g Oral BID  ? senna-docusate  1 tablet Oral BID  ? sorbitol, milk of mag, mineral oil, glycerin (SMOG) enema  960 mL Rectal Once  ? sucroferric oxyhydroxide  1,000 mg Oral TID WC  ? thiamine injection  100 mg Intravenous Daily  ? Or  ? thiamine  100 mg Oral Daily  ? zinc sulfate  220 mg Oral BID  ? ? ? ?Dialysis Orders:  MWF [facility] ?4h 4min  450/500    117.8kg   2/ 2.5Ca bath L AVF ?--Hep 6000 units IV TIW ? - on 3/19 > hep B Ag neg, and Hep B Ab's high/ protective ? - calcitriol 1.70mcg PO  qHD-last 01/31/22 ?  ?Assessment/Plan: ?R foot osteomyelitis- on IV vanc/cefepime/Flagyl. + Morganella bacteremia 2/2 foot abscess. S/p R foot I&D on 3/19 by podiatry and another I&D done 3/25 now with wound vac. Will need 6 weeks ABX for attempted limb salvage from 3/25.  -- Vanc/cefepime + PO Flagyl ?Acute metabolic encephalopathy: Improved.  ?CVA: Neurology consulted. MRI done 02/04/2022 Numerous bilat punctate acute infarctions. Per primary. Avoid hypotension ?ESRD - on HD MWF. Cont on schedule: 2K, no heparin, AVF, 450/800, 2.5 Ca, keep SBP > 120 UF 2-3L. Next HD 04/03 ?Hypertension/volume  - Blood pressures acceptable; euvolemic on exam. UF as tolerated. Avoid hypotension during HD. Keep SBP >120. Under OP EDW. Will need EDW reset on discharge.  ?Anemia of CKD - Hb drifting down, ESA darbe cont qWed, ?Secondary Hyperparathyroidism -  CCa okay. Continue VDRA with HD. Phos stable ?HD access: L AVF w/ thinning, ulcerated area at the lower portion. Stick away from area. VVS consulted and went for fistulogram on 3/23 by VVS > they did L central venoplasty to innominate and subclavian veins, and peripheral venoplasty to the cephalic vein.  ? ?Rexene Agent, MD  ?02/16/2022, 10:13 AM  ?[facility] ?[phone] ? ? ?  ? ?

## 2022-02-17 DIAGNOSIS — G9341 Metabolic encephalopathy: Secondary | ICD-10-CM | POA: Diagnosis not present

## 2022-02-17 DIAGNOSIS — N186 End stage renal disease: Secondary | ICD-10-CM | POA: Diagnosis not present

## 2022-02-17 DIAGNOSIS — M869 Osteomyelitis, unspecified: Secondary | ICD-10-CM | POA: Diagnosis not present

## 2022-02-17 DIAGNOSIS — J9601 Acute respiratory failure with hypoxia: Secondary | ICD-10-CM | POA: Diagnosis not present

## 2022-02-17 LAB — GLUCOSE, CAPILLARY
Glucose-Capillary: 137 mg/dL — ABNORMAL HIGH (ref 70–99)
Glucose-Capillary: 121 mg/dL — ABNORMAL HIGH (ref 70–99)
Glucose-Capillary: 210 mg/dL — ABNORMAL HIGH (ref 70–99)
Glucose-Capillary: 210 mg/dL — ABNORMAL HIGH (ref 70–99)
Glucose-Capillary: 195 mg/dL — ABNORMAL HIGH (ref 70–99)

## 2022-02-17 LAB — CBC WITH DIFFERENTIAL/PLATELET
Abs Immature Granulocytes: 0.05 10*3/uL (ref 0.00–0.07)
Basophils Absolute: 0.1 10*3/uL (ref 0.0–0.1)
Basophils Relative: 1 %
Eosinophils Absolute: 0.5 10*3/uL (ref 0.0–0.5)
Eosinophils Relative: 4 %
HCT: 27.9 % — ABNORMAL LOW (ref 39.0–52.0)
Hemoglobin: 8.7 g/dL — ABNORMAL LOW (ref 13.0–17.0)
Immature Granulocytes: 0 %
Lymphocytes Relative: 14 %
Lymphs Abs: 1.5 10*3/uL (ref 0.7–4.0)
MCH: 30.2 pg (ref 26.0–34.0)
MCHC: 31.2 g/dL (ref 30.0–36.0)
MCV: 96.9 fL (ref 80.0–100.0)
Monocytes Absolute: 1.5 10*3/uL — ABNORMAL HIGH (ref 0.1–1.0)
Monocytes Relative: 14 %
Neutro Abs: 7.5 10*3/uL (ref 1.7–7.7)
Neutrophils Relative %: 67 %
Platelets: 511 10*3/uL — ABNORMAL HIGH (ref 150–400)
RBC: 2.88 MIL/uL — ABNORMAL LOW (ref 4.22–5.81)
RDW: 17.9 % — ABNORMAL HIGH (ref 11.5–15.5)
WBC: 11.2 10*3/uL — ABNORMAL HIGH (ref 4.0–10.5)
nRBC: 0 % (ref 0.0–0.2)

## 2022-02-17 LAB — RENAL FUNCTION PANEL
Albumin: 2.3 g/dL — ABNORMAL LOW (ref 3.5–5.0)
Anion gap: 12 (ref 5–15)
BUN: 38 mg/dL — ABNORMAL HIGH (ref 6–20)
CO2: 24 mmol/L (ref 22–32)
Calcium: 8.9 mg/dL (ref 8.9–10.3)
Chloride: 98 mmol/L (ref 98–111)
Creatinine, Ser: 12.3 mg/dL — ABNORMAL HIGH (ref 0.61–1.24)
GFR, Estimated: 4 mL/min — ABNORMAL LOW (ref >60)
Glucose, Bld: 137 mg/dL — ABNORMAL HIGH (ref 70–99)
Phosphorus: 4.1 mg/dL (ref 2.5–4.6)
Potassium: 4.6 mmol/L (ref 3.5–5.1)
Sodium: 134 mmol/L — ABNORMAL LOW (ref 135–145)

## 2022-02-17 LAB — VANCOMYCIN, RANDOM: Vancomycin Rm: 33

## 2022-02-17 MED ORDER — VANCOMYCIN HCL 500 MG/100ML IV SOLN: 500.0000 mg | INTRAVENOUS | Status: DC

## 2022-02-17 NOTE — Consult Note (Signed)
[facility] Nurse wound follow up ?Patient receiving care in [facility] 3W26 ?Wound type: Surgical ?Measurement: 15 cm x 7 cm x 1.3 cm ?Wound bed: Beefy red with bloody drainage ?Drainage (amount, consistency, odor)  ?Periwound: intact ?Dressing procedure/placement/frequency: ?Removed 1 black foam, replaced 1 piece of black foam after placing a piece of Mepitel. Drape applied, immediate suction obtained at 125 mmHg. ? ?WOC following MWF for vac dressing changes.  ? ?Cathlean Marseilles Tamala Julian, MSN, RN, CMSRN, AGCNS, WTA ?Wound Treatment Associate ?Pager [phone]  ?  ?

## 2022-02-17 NOTE — Progress Notes (Signed)
Report given to HD ?

## 2022-02-17 NOTE — Progress Notes (Signed)
?[facility] ?Progress Note  ? ?Subjective: No interval events.  Awaiting SNF. Seen on HD, no c/o's. Joking around w/ other staff.  ? ?Objective ?Vitals:  ? 02/17/22 1100 02/17/22 1130 02/17/22 1200 02/17/22 1525  ?BP: 128/69 (!) 112/50 (!) 126/59 (!) 154/71  ?Pulse: 83 81 82 87  ?Resp:   13 16  ?Temp:   97.8 ?F (36.6 ?C)   ?TempSrc:   Temporal   ?SpO2:   97% 98%  ?Weight:   114.8 kg   ?Height:      ? ?Physical Exam ?General: NAD ?Heart: S1,S2 RRR No M/R/G ?Lungs: No WOB. CTAB ?Abdomen: Obese, NT, ND ?Extremities: No LE edema. Wound VAC intact on foot.  ?Dialysis Access: L AVF +Thrill ? ?OP HD:  MWF [facility] ?4h 44min  450/500    117.8kg   2/ 2.5Ca bath L AVF Hep 6000  ? - on 3/19 > hep B Ag neg, and Hep B Ab's high/ protective ? - calcitriol 1.8mcg PO qHD-last 01/31/22 ?  ?Assessment/Plan: ?R foot osteomyelitis- on IV vanc/cefepime/Flagyl. + Morganella bacteremia 2/2 foot abscess. S/p R foot I&D on 3/19 by podiatry and another I&D done 3/25 now with wound vac. Will need 6 weeks ABX from 3/25 for attempted limb salvage - IV vanc/cefepime + PO Flagyl ?Acute metabolic encephalopathy: Improved.  ?CVA: Neurology consulted. MRI done 02/04/2022 Numerous bilat punctate acute infarctions. Per primary. Avoid hypotension ?ESRD - on HD MWF. HD today.  ?Hypertension/volume  - Blood pressures acceptable; euvolemic on exam. UF as tolerated. Avoid hypotension during HD. Keep SBP >120. Under OP EDW. Will need EDW reset on discharge.  ?Anemia of CKD - Hb drifting down, ESA darbe cont qWed, ?Secondary Hyperparathyroidism -  CCa okay. Continue VDRA with HD. Phos stable ?HD access: L AVF w/ thinning, ulcerated area at the lower portion. Stick away from area. VVS consulted and went for fistulogram on 3/23 by VVS > they did L central venoplasty to innominate and subclavian veins, and peripheral venoplasty to the cephalic vein.  ? ?[name] Splinter, MD ?02/17/2022, 3:50 PM ? ? ? ?Basic Metabolic Panel: ?Recent Labs  ?Lab  02/15/22 ?0867 02/16/22 ?0238 02/17/22 ?0315  ?NA 133* 134* 134*  ?K 4.0 4.9 4.6  ?CL 97* 98 98  ?CO2 27 25 24   ?GLUCOSE 206* 218* 137*  ?BUN 18 27* 38*  ?CREATININE 7.65* 10.05* 12.30*  ?CALCIUM 8.2* 8.5* 8.9  ?PHOS 3.0 3.5 4.1  ? ? ?No results for input(s): LIPASE, AMYLASE in the last 168 hours. ?CBC: ?Recent Labs  ?Lab 02/13/22 ?0414 02/14/22 ?6195 02/15/22 ?0932 02/16/22 ?0238 02/17/22 ?0315  ?WBC 11.9* 10.5 11.2* 10.7* 11.2*  ?NEUTROABS 8.1* 6.8 7.3 6.7 7.5  ?HGB 8.9* 8.3* 8.5* 9.0* 8.7*  ?HCT 27.4* 26.3* 26.9* 27.7* 27.9*  ?MCV 96.1 94.9 96.1 96.2 96.9  ?PLT 428* 455* 444* 503* 511*  ? ? ?Iron Studies: No results for input(s): IRON, TIBC, TRANSFERRIN, FERRITIN in the last 72 hours. ?@lablastinr3 @ ? ?Medications: ? ceFEPime (MAXIPIME) IV 2 g (02/14/22 2134)  ? sodium chloride    ? [START ON 02/21/2022] vancomycin    ? ? (feeding supplement) PROSource Plus  30 mL Oral BID BM  ? vitamin C  500 mg Oral Daily  ? aspirin EC  81 mg Oral Daily  ? Chlorhexidine Gluconate Cloth  6 each Topical Q0600  ? darbepoetin (ARANESP) injection - DIALYSIS  100 mcg Intravenous Q Wed-HD  ? famotidine  20 mg Oral Daily  ? feeding supplement (NEPRO CARB STEADY)  237 mL Oral BID BM  ? heparin  5,000 Units Subcutaneous Q8H  ? insulin aspart  0-15 Units Subcutaneous TID WC  ? insulin aspart  0-5 Units Subcutaneous QHS  ? insulin glargine-yfgn  20 Units Subcutaneous Daily  ? metroNIDAZOLE  500 mg Oral Q12H  ? midodrine  10 mg Oral Q M,W,F-HD  ? multivitamin  1 tablet Oral QHS  ? pantoprazole  40 mg Oral Q1200  ? polyethylene glycol  17 g Oral BID  ? senna-docusate  1 tablet Oral BID  ? sorbitol, milk of mag, mineral oil, glycerin (SMOG) enema  960 mL Rectal Once  ? sucroferric oxyhydroxide  1,000 mg Oral TID WC  ? thiamine injection  100 mg Intravenous Daily  ? Or  ? thiamine  100 mg Oral Daily  ? ? ? ? ? ? ? ? ?  ? ?

## 2022-02-17 NOTE — Progress Notes (Signed)
Contacted by CSW this afternoon. Pt's daughter has chosen [facility] for short term rehab. Pt's out-pt HD clinic will need to be changed to a [facility]. Referral made to [facility] admissions this afternoon to assist with finding clinic with availability in the [place] area. Will provide update to CSW once clinic is located. Contacted [facility] and spoke to [place]. Clinic aware of pt's need for short term rehab and that pt will return to clinic once rehab has been completed. Will assist as needed.  ? ?Melven Sartorius ?Renal Navigator ?[phone] ?

## 2022-02-17 NOTE — Progress Notes (Signed)
?PROGRESS NOTE ? ? ? ?AMI [name] Sr.  HUD:[id number] DOB: 1961-01-05 DOA: 02/02/2022 ?PCP: [facility]  ? ? ?Chief Complaint  ?Patient presents with  ? Altered Mental Status  ? ? ?Brief Narrative:  ?61 y/o male history of DM, ESRD on HD, admitted to the hospital with fever, acute encephalopathy, sepsis and right foot infection.  He has a history of prior right fifth metatarsal amputation performed many years ago in [place].  Plain films of his foot indicate swelling and subcutaneous gas without any obvious bony erosion. He initially presented to [facility] and subsequently transferred to [facility].  Podiatry consulted, he was taken emergently to the [facility] on 3/19.  Postoperatively he has had persistent lethargy and was taken to the ICU 3/19 evening.  He was placed on Narcan drip for presumed lethargy due to opioid use however he only got 5 mg of oxycodone.  Narcan drip was discontinued on 3/20 and he was transferred out of the ICU. He continued to have altered mental status and was found to have findings consistent with acute strokes.  Neurology was consulted and suspects related to hypotension.  His mental status has gradually improved.  Blood cultures were positive with morganella and clostridium related to his foot infection.  ID has been consulted and recommended 6 weeks of abx after last debridement.  He went back to OR on 3/25 for I&D with podiatry.  Hospitalization c/b malfunctioning L AV fistula, he's s/p fistulogram with vascular on 3/23.  At this time, plan is for d/c to SNF when bed available.  He had nausea/vomiting of unclear etiology 3/28, workup pending. ? ?See below for additional details ?   ? ? ?Assessment & Plan: ? Principal Problem: ?  Osteomyelitis ([place]) ?Active Problems: ?  Severe sepsis ([place]) ?  Nausea & vomiting ?  Hypotension ?  Acute metabolic encephalopathy ?  Stroke [facility]) ?  ESRD (end stage renal disease) ([place]) ?  DM2 (diabetes mellitus, type 2) ([place]) ?  HTN  (hypertension) ?  Chronic systolic CHF (congestive heart failure) ([place]) ?  Acute respiratory failure with hypoxia ([place]) ?  Protein-calorie malnutrition, mild ([place]) ?  GERD (gastroesophageal reflux disease) ?  Carotid stenosis ?  Hyponatremia ?  Hyperkalemia ?  High anion gap metabolic acidosis ?  Anemia of chronic renal failure ?  Necrotizing soft tissue infection ?  Cellulitis in diabetic foot ([place]) ?  Bacteremia ?  Right foot infection ? ? ? 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 ?Appreciate ID assistance, continuing vanc, flagyl, and cefepime - low suspicion for endocarditis per ID, TTE without eivdence of valvular vegetations on TTE (per ID, "do not feel strongly regarding getting TEE from ID standpoint given low grade bacteremia with GNR/GPR with right  foot being source, would defer need of TEE to neurology if helpful for stroke workup - neurology did not recommend from their standpoint).  Recommending 6 weeks of abx after last debridement intervention (from 3/25).--Vancomycin/cefepime during HD and p.o. Flagyl ? ? ?Hypotension ?Post op, likely multifactorial, blood loss,  meds ?He was weaned off pressors after small bolus post op 3/25 ?Will continue to trend Hb/hct ?Blood pressure stable and improving and now starting to trend up. ?-Monitor for now. ?Continue midodrine 10 mg pre HD ? ?Nausea & vomiting ?Unclear etiology ?Likely secondary to constipation/fecal impaction. ?KUB with stool in rectosigmoid concerning for constipation/fecal impaction ?Disimpaction/enema ordered with good results. ?-Clinical improvement. ?-Patient denies any further nausea or vomiting. ?-Patient diet advanced to a soft diet which he is tolerating. ?-Continue current bowel regimen of MiraLAX twice daily, Senokot-S twice daily. ?-IV antiemetics as needed.  Supportive care. ? ?Stroke [facility]) ?MRI brain 3/22 with numerous punctate acute infarctions throughout both cerebral hemispheres, primarily affecting the deep brain ?Echo with Ef 45-50%, global hypokinesis, RVSF mildly reduced ?CTA neck 3/20 without LVO or hemodynamically significant stenosis of intracranial arteries, did have 70% stenosis for R ICA due to mixed density atherosclerosis ?CT venogram normal ?LDL 46, A1c 12.2 ?Appreciate neurology recommendations - suspect likely 2/2 hypoperfusion with intermittent hypotension.  Aspirin 81 mg daily.  No statin at this time, LDL at goal. ?Will need outpatient follow-up with neurology. ? ? ?Acute metabolic encephalopathy ?Related to stroke as well as infection and acute hospitalization ?Clinical improvement. 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 ? ? ?ESRD (end stage renal disease) ([place]) ?Per renal ?Being evaluated outpatient for possible transplant ?S/p L arm fistulogram with vascular 3/23 -> L central angioplasty innominate and subclavian vein, L peripheral angioplasty cephalic vein ?-Patient with hemodialysis  Monday Wednesday Friday schedule.   ?-Per nephrology. ? ?DM2 (diabetes mellitus, type 2) ([place]) ?Hemoglobimn a1c 12.2 (02/02/2022). ?CBG 137 this morning. ?Continue Semglee to 20 units daily.   ?-SSI.  ? ? ?HTN (hypertension) ?Intermittent hypotension, concern that this may have resulted in watershed infarcts. ?-Blood pressure stable. ?-Hydralazine as needed. ?-Continue midodrine pre-HD. ? ?Acute respiratory failure with hypoxia ([place]) ?Currently has been weaned to RA with sats of 93 to 97%, continue to monitor ?CXR 3/20 without frank edema ? ?Chronic systolic CHF (congestive heart failure) ([place]) ?EF 45% per echo 05/2021 ?Echo 3/22 with EF 45-50% ?Fluid management per nephrology. ? ?Carotid stenosis ?-Outpatient vascular follow up ? ?GERD (gastroesophageal reflux disease) ?- PPI, Pepcid. ?-GI cocktail as needed. ? ?Protein-calorie malnutrition, mild ([place]) ?- Nutritional supplementation. ? ?Necrotizing soft tissue infection ?- See above osteomyelitis/sepsis ? ?Anemia of chronic renal failure ?- H&H stable at 8.7. ? ?Hyperkalemia ?- On hemodialysis. ?-Potassium of 4.6 today ? ?Hyponatremia ?- Improving on HD. ?-Sodium at 134. ? ? ? ? ?  ? ? ?DVT prophylaxis: Heparin ?Code Status: Full ?Family Communication: Updated patient.  Updated brother at bedside.  ?Disposition: SNF when bed available.   ? ?Status is: Inpatient ?Remains inpatient appropriate because: Severity of illness. ?  ?Consultants:  ?Podiatry: Dr. Blenda Mounts 02/02/2022 ?Nephrology: Dr. Joylene Grapes 02/02/2022 ?PCCM: Dr.Ismail 02/03/2022 ?Infectious disease: Dr.Manandhar 02/03/2022 ?Neurology: Dr.Collins 02/04/2022 ?Vascular surgery: Dr.Brabham 02/05/2022 ? ?Procedures:  ?CT head 02/02/2022 ?CT angiogram head and neck 02/03/2022 ?Plain films of the right foot 02/02/2022, 02/08/2022 ?Chest x-ray 02/03/2022 ?Abdominal films 02/04/2022, 02/11/2022 ?MRI right foot/MRI right  ankle 02/06/2022 ?2D echo 02/05/2022 ?MRI brain 02/04/2022 ?Left brachiocephalic AV fistula cannulation under  ultrasound guidance/lifestyle fistulogram including central venogram/left central angioplasty innominate and subclavian vein/left peripheral angioplasty cephalic vein per Dr. Carlis Abbott vascular surgeon 02/06/2022 ?CT venogra

## 2022-02-17 NOTE — Progress Notes (Signed)
Pharmacy Antibiotic Note ? ?[name] MCCARRY Sr. is a 61 y.o. male admitted on 02/02/2022 with bacteremia secondary to foot abscess.  Pharmacy has been consulted for vancomycin and cefepime dosing. Patient has HD MWF.  ?Continue all abx for 6 weeks after last I&D (stop date: 03/22/22). ? ?3/31- vR 34 mcg/mL ?4/3  - vR 33 mcg/mL ? ?Plan: ?Hold vancomycin dose on Wednesday (4/5) ?Decrease vancomycin to 500 mg qMWF post HD, starting on Friday (4/7) ?Continue cefepime 2 g IV qMWF post HD ?Continue flagyl 500 mg PO BID ? ?Height: 6\' 1"  (185.4 cm) ?Weight: 116.9 kg (257 lb 11.5 oz) ?IBW/kg (Calculated) : 79.9 ? ?Temp (24hrs), Avg:98.4 ?F (36.9 ?C), Min:97.4 ?F (36.3 ?C), Max:98.8 ?F (37.1 ?C) ? ?Recent Labs  ?Lab 02/13/22 ?0414 02/14/22 ?4128 02/15/22 ?7867 02/16/22 ?0238 02/17/22 ?0315  ?WBC 11.9* 10.5 11.2* 10.7* 11.2*  ?CREATININE 8.31* 10.92* 7.65* 10.05* 12.30*  ?VANCORANDOM  --  34  --   --  53  ? ?  ?Estimated Creatinine Clearance: 8.7 mL/min (A) (by C-G formula based on SCr of 12.3 mg/dL (H)).   ? ?Allergies  ?Allergen Reactions  ? Sulfa Antibiotics Nausea And Vomiting  ? ? ?Antimicrobials this admission: ?Cefepime 3/20 >> [5/6] ?Metronidazole 3/20 >> [5/6] ?Vancomycin 3/20 >> [5/6] ? ?Dose adjustments this admission: ?3/31-vanc decreased from 1g to 750 mg post HD ?4/3 - hold vanc on next HD day and reduce dose to 500 mg post HD ? ?Microbiology results: ?3/19 BCx: morganella morganii (resistant to amp, unasyn, and cefazolin) ?3/19 WCx morganella morganii (resistant to amp, unasyn, and cefazolin) ?3/20 MRSA PCR surgical: positive ? ? ?Thank you for allowing [place] to participate in this patients care. ?Jens Som, PharmD ?02/17/2022 1:05 PM ? ?**Pharmacist phone directory can be found on [email or web address] listed under [place]** ? ? ? ? ? ? ?  ? ? ?

## 2022-02-18 DIAGNOSIS — G9341 Metabolic encephalopathy: Secondary | ICD-10-CM | POA: Diagnosis not present

## 2022-02-18 DIAGNOSIS — J9601 Acute respiratory failure with hypoxia: Secondary | ICD-10-CM | POA: Diagnosis not present

## 2022-02-18 DIAGNOSIS — N186 End stage renal disease: Secondary | ICD-10-CM | POA: Diagnosis not present

## 2022-02-18 DIAGNOSIS — M869 Osteomyelitis, unspecified: Secondary | ICD-10-CM | POA: Diagnosis not present

## 2022-02-18 LAB — RENAL FUNCTION PANEL
Albumin: 2.3 g/dL — ABNORMAL LOW (ref 3.5–5.0)
Anion gap: 10 (ref 5–15)
BUN: 25 mg/dL — ABNORMAL HIGH (ref 6–20)
CO2: 28 mmol/L (ref 22–32)
Calcium: 8.9 mg/dL (ref 8.9–10.3)
Chloride: 96 mmol/L — ABNORMAL LOW (ref 98–111)
Creatinine, Ser: 8.52 mg/dL — ABNORMAL HIGH (ref 0.61–1.24)
GFR, Estimated: 7 mL/min — ABNORMAL LOW (ref >60)
Glucose, Bld: 216 mg/dL — ABNORMAL HIGH (ref 70–99)
Phosphorus: 3.2 mg/dL (ref 2.5–4.6)
Potassium: 4.2 mmol/L (ref 3.5–5.1)
Sodium: 134 mmol/L — ABNORMAL LOW (ref 135–145)

## 2022-02-18 LAB — CBC WITH DIFFERENTIAL/PLATELET
Abs Immature Granulocytes: 0.03 10*3/uL (ref 0.00–0.07)
Basophils Absolute: 0.1 10*3/uL (ref 0.0–0.1)
Basophils Relative: 1 %
Eosinophils Absolute: 0.6 10*3/uL — ABNORMAL HIGH (ref 0.0–0.5)
Eosinophils Relative: 6 %
HCT: 28.6 % — ABNORMAL LOW (ref 39.0–52.0)
Hemoglobin: 8.9 g/dL — ABNORMAL LOW (ref 13.0–17.0)
Immature Granulocytes: 0 %
Lymphocytes Relative: 15 %
Lymphs Abs: 1.4 10*3/uL (ref 0.7–4.0)
MCH: 30.5 pg (ref 26.0–34.0)
MCHC: 31.1 g/dL (ref 30.0–36.0)
MCV: 97.9 fL (ref 80.0–100.0)
Monocytes Absolute: 1.5 10*3/uL — ABNORMAL HIGH (ref 0.1–1.0)
Monocytes Relative: 16 %
Neutro Abs: 5.8 10*3/uL (ref 1.7–7.7)
Neutrophils Relative %: 62 %
Platelets: 466 10*3/uL — ABNORMAL HIGH (ref 150–400)
RBC: 2.92 MIL/uL — ABNORMAL LOW (ref 4.22–5.81)
RDW: 18 % — ABNORMAL HIGH (ref 11.5–15.5)
WBC: 9.4 10*3/uL (ref 4.0–10.5)
nRBC: 0 % (ref 0.0–0.2)

## 2022-02-18 LAB — GLUCOSE, CAPILLARY
Glucose-Capillary: 228 mg/dL — ABNORMAL HIGH (ref 70–99)
Glucose-Capillary: 202 mg/dL — ABNORMAL HIGH (ref 70–99)
Glucose-Capillary: 137 mg/dL — ABNORMAL HIGH (ref 70–99)
Glucose-Capillary: 162 mg/dL — ABNORMAL HIGH (ref 70–99)

## 2022-02-18 MED ORDER — INSULIN GLARGINE-YFGN 100 UNIT/ML ~~LOC~~ SOLN
22.0000 [IU] | Freq: Every day | SUBCUTANEOUS | Status: DC
  Administered 2022-02-19 – 2022-02-20 (×2): 22 [IU] via SUBCUTANEOUS
  Filled 2022-02-18 (×2): qty 0.22

## 2022-02-18 MED ORDER — INSULIN GLARGINE-YFGN 100 UNIT/ML ~~LOC~~ SOLN
2.0000 [IU] | Freq: Once | SUBCUTANEOUS | Status: AC
  Administered 2022-02-18: 2 [IU] via SUBCUTANEOUS
  Filled 2022-02-18: qty 0.02

## 2022-02-18 NOTE — Progress Notes (Signed)
Speech Language Pathology Treatment: Cognitive-Linquistic  ?Patient Details ?Name: [name] CHAUSSE Sr. ?MRN: [id number] ?DOB: April 30, 1961 ?Today's Date: 02/18/2022 ?Time: 8841-6606 ?SLP Time Calculation (min) (ACUTE ONLY): 16 min ? ?Assessment / Plan / Recommendation ?Clinical Impression ? Pt seen for cognitive therapy given portions of the Cognistat to help determine progress since initial assessment. He improved in the area of orientation and attention for digit repetition and scored within average range. He was able to immediately recall 5/5 words x 2. He continues to demonstrate difficulty with mental calculations, working memory, Animal nutritionist and reasoning. He stated he feels his cognition has improved from initial assessment and therapist explained ST will continue working on deficits.  ?  ?HPI HPI: Patient is 61 year old African-American male past medical history of end-stage renal disease hemodialysis chronic systolic heart failure type 2 diabetes admitted to [facility] 2 days ago with acute encephalopathy secondary to infection osteomyelitis in the right foot.  He was taken to the OR with debridement he was started on vancomycin cefepime Flagyl and overnight he started to have low blood pressure on the altered mental status minimal response to Narcan. Pt had been given pain medication. Swallow eval on 3/20 recommended regular solids and thin liquids with assistance needed due to cognitive impact on swallowing. MRI 3/21 revealed numerous punctate acute infarctions scattered throughout both cerebral hemispheres. ?  ?   ?SLP Plan ? Continue with current plan of care ? ?  ?  ?Recommendations for follow up therapy are one component of a multi-disciplinary discharge planning process, led by the attending physician.  Recommendations may be updated based on patient status, additional functional criteria and insurance authorization. ?  ? ?Recommendations  ?   ?   ?    ?   ? ? ? ? Oral Care  Recommendations: Oral care BID ?Follow Up Recommendations: Skilled nursing-short term rehab (<3 hours/day) ?Assistance recommended at discharge: Intermittent Supervision/Assistance ?SLP Visit Diagnosis: Cognitive communication deficit (R41.841) ?Plan: Continue with current plan of care ? ? ? ? ?  ?  ? ? ?[name] [name] ? ?02/18/2022, 1:59 PM ?

## 2022-02-18 NOTE — Plan of Care (Signed)
Pt is alert oriented x 4. Pt had IV antibiotics per order. Pt denies pain. CBG completed per order. Pt has dressing/ wound vac to right foot, dressing dry and intact, draining appropriately. No distress noted. Pt had CHG bath this am.  ? ? ?Problem: Education: ?Goal: Knowledge of General Education information will improve ?Description: Including pain rating scale, medication(s)/side effects and non-pharmacologic comfort measures ?Outcome: Progressing ?  ?Problem: Health Behavior/Discharge Planning: ?Goal: Ability to manage health-related needs will improve ?Outcome: Progressing ?  ?Problem: Clinical Measurements: ?Goal: Ability to maintain clinical measurements within normal limits will improve ?Outcome: Progressing ?Goal: Will remain free from infection ?Outcome: Progressing ?Goal: Diagnostic test results will improve ?Outcome: Progressing ?Goal: Respiratory complications will improve ?Outcome: Progressing ?Goal: Cardiovascular complication will be avoided ?Outcome: Progressing ?  ?Problem: Activity: ?Goal: Risk for activity intolerance will decrease ?Outcome: Progressing ?  ?Problem: Coping: ?Goal: Level of anxiety will decrease ?Outcome: Progressing ?  ?Problem: Elimination: ?Goal: Will not experience complications related to bowel motility ?Outcome: Progressing ?Goal: Will not experience complications related to urinary retention ?Outcome: Progressing ?  ?Problem: Pain Managment: ?Goal: General experience of comfort will improve ?Outcome: Progressing ?  ?Problem: Safety: ?Goal: Ability to remain free from injury will improve ?Outcome: Progressing ?  ?Problem: Skin Integrity: ?Goal: Risk for impaired skin integrity will decrease ?Outcome: Progressing ?  ?Problem: Education: ?Goal: Knowledge of disease or condition will improve ?Outcome: Progressing ?Goal: Knowledge of secondary prevention will improve (SELECT ALL) ?Outcome: Progressing ?Goal: Knowledge of patient specific risk factors will improve (INDIVIDUALIZE  FOR PATIENT) ?Outcome: Progressing ?  ?Problem: Coping: ?Goal: Will verbalize positive feelings about self ?Outcome: Progressing ?Goal: Will identify appropriate support needs ?Outcome: Progressing ?  ?Problem: Health Behavior/Discharge Planning: ?Goal: Ability to manage health-related needs will improve ?Outcome: Progressing ?  ?Problem: Self-Care: ?Goal: Ability to participate in self-care as condition permits will improve ?Outcome: Progressing ?Goal: Verbalization of feelings and concerns over difficulty with self-care will improve ?Outcome: Progressing ?Goal: Ability to communicate needs accurately will improve ?Outcome: Progressing ?  ?Problem: Nutrition: ?Goal: Risk of aspiration will decrease ?Outcome: Progressing ?Goal: Dietary intake will improve ?Outcome: Progressing ?  ?Problem: Ischemic Stroke/TIA Tissue Perfusion: ?Goal: Complications of ischemic stroke/TIA will be minimized ?Outcome: Progressing ?  ?Problem: Education: ?Goal: Knowledge of disease or condition will improve ?Outcome: Progressing ?Goal: Knowledge of secondary prevention will improve (SELECT ALL) ?Outcome: Progressing ?Goal: Individualized Educational Video(s) ?Outcome: Progressing ?  ?Problem: Coping: ?Goal: Will verbalize positive feelings about self ?Outcome: Progressing ?Goal: Will identify appropriate support needs ?Outcome: Progressing ?  ?Problem: Health Behavior/Discharge Planning: ?Goal: Ability to manage health-related needs will improve ?Outcome: Progressing ?  ?Problem: Self-Care: ?Goal: Ability to participate in self-care as condition permits will improve ?Outcome: Progressing ?Goal: Verbalization of feelings and concerns over difficulty with self-care will improve ?Outcome: Progressing ?Goal: Ability to communicate needs accurately will improve ?Outcome: Progressing ?  ?Problem: Nutrition: ?Goal: Risk of aspiration will decrease ?Outcome: Progressing ?Goal: Dietary intake will improve ?Outcome: Progressing ?  ?Problem:  Education: ?Goal: Knowledge of secondary prevention will improve (SELECT ALL) ?Outcome: Progressing ?  ?

## 2022-02-18 NOTE — Progress Notes (Signed)
Pt's case is under review by [facility]. Pt's schedule and time is unknown at this time. Update provided to CSW. Will assist as needed. ? ?Melven Sartorius ?Renal Navigator ?[phone] ?

## 2022-02-18 NOTE — Progress Notes (Signed)
Occupational Therapy Treatment ?Patient Details ?Name: [name] MORAGNE Sr. ?MRN: [id number] ?DOB: 03-13-1961 ?Today's Date: 02/18/2022 ? ? ?History of present illness 61 y.o. male  presents 02/02/22 with altered mental status. Pt found to have osteomyelitis of R foot, sepsis and acute respiratory failure with hypoxia with acute metabolic encephalopathy. 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 Transferred to ICU after LoC and hypotension systolic in 98Y placed on 5L O2 and given Narcan. 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 PMH: diabetes mellitus type 2, CHF, ESRD, hypertension, myocardial infarction, nonischemic cardiomyopathy. s/p Right irrigation and debridement with removal of residual bone at 4-5 metatarsals on 3/25. ?  ?OT comments ? Pt progressing towards OT goals with 3 goals met and upgraded accordingly. Session focused on LB dressing tasks, education on precautions and impact of daily task completion. Discussed possibility of w/c education/use for mobility d/t WB precautions though unsure if pt's home is w/c accessible. Will continue to follow, progress ADLs and w/c education as appropriate.  ? ?Recommendations for follow up therapy are one component of a multi-disciplinary discharge planning process, led by the attending physician.  Recommendations may be updated based on patient status, additional functional criteria and insurance authorization. ?   ?Follow Up Recommendations ? Skilled nursing-short term rehab (<3 hours/day)  ?  ?Assistance Recommended at Discharge Frequent or constant Supervision/Assistance  ?Patient can return home with the following ? A lot of help with walking and/or transfers;A lot of help with bathing/dressing/bathroom;Assist for transportation;Help with stairs or ramp for entrance ?  ?Equipment Recommendations ? Wheelchair (measurements OT);Wheelchair cushion (measurements OT)  ?  ?Recommendations for Other Services   ? ?  ?Precautions / Restrictions  Precautions ?Precautions: Fall ?Precaution Comments: WBAT for transfers only R LE per podiatry note on 3/26 ?Required Braces or Orthoses: Other Brace ?Other Brace: Post op Shoe ?Restrictions ?Weight Bearing Restrictions: Yes ?RLE Weight Bearing: Non weight bearing ?Other Position/Activity Restrictions: WBAT transfers only  ? ? ?  ? ?Mobility Bed Mobility ?Overal bed mobility: Needs Assistance ?Bed Mobility: Supine to Sit, Sit to Supine ?  ?  ?Supine to sit: Min assist, HOB elevated ?Sit to supine: Min guard ?  ?General bed mobility comments: Min A via handheld assist to pull trunk forward. able to get BLE back into bed without assist ?  ? ?Transfers ?Overall transfer level: Needs assistance ?Equipment used: Rolling walker (2 wheels) ?Transfers: Sit to/from Stand ?Sit to Stand: Min assist ?  ?  ?  ?  ?  ?General transfer comment: Min A for steadying RW in standing with increased time/effort on pt's part. Simulated to couch height that pt sleeps on at home; declined to get in chair this AM ?  ?  ?Balance Overall balance assessment: Needs assistance ?Sitting-balance support: No upper extremity supported, Feet supported ?Sitting balance-Leahy Scale: Good ?  ?  ?Standing balance support: Bilateral upper extremity supported, During functional activity ?Standing balance-Leahy Scale: Poor ?  ?  ?  ?  ?  ?  ?  ?  ?  ?  ?  ?  ?   ? ?ADL either performed or assessed with clinical judgement  ? ?ADL Overall ADL's : Needs assistance/impaired ?  ?  ?  ?  ?  ?  ?  ?  ?  ?  ?Lower Body Dressing: Moderate assistance;Sit to/from stand;Sitting/lateral leans ?Lower Body Dressing Details (indicate cue type and reason): focus on donning post op shoe and sock EOB with Min A at most; will need  increased assist for clothing over waist in standing due to balance deficits ?  ?  ?  ?  ?  ?  ?  ?General ADL Comments: Emphasis on LB dressing, standing and education on precautions per podiatry note and how this impacts daily activities. Discussed  likely need for w/c for mobility though pt not forthcoming if home is w/c accessible ?  ? ?Extremity/Trunk Assessment Upper Extremity Assessment ?Upper Extremity Assessment: Overall WFL for tasks assessed ?  ?Lower Extremity Assessment ?Lower Extremity Assessment: Defer to PT evaluation ?  ?  ?  ? ?Vision   ?Vision Assessment?: No apparent visual deficits ?  ?Perception   ?  ?Praxis   ?  ? ?Cognition Arousal/Alertness: Awake/alert ?Behavior During Therapy: Flat affect ?Overall Cognitive Status: Impaired/Different from baseline ?Area of Impairment: Safety/judgement, Awareness, Following commands, Problem solving ?  ?  ?  ?  ?  ?  ?  ?  ?  ?  ?  ?Following Commands: Follows one step commands consistently, Follows multi-step commands with increased time ?Safety/Judgement: Decreased awareness of safety, Decreased awareness of deficits ?Awareness: Intellectual ?Problem Solving: Slow processing, Decreased initiation ?General Comments: flat affect and slower processing; follows directions well though delayed response time for questions and difficulty problem solving ?  ?  ?   ?Exercises   ? ?  ?Shoulder Instructions   ? ? ?  ?General Comments    ? ? ?Pertinent Vitals/ Pain       Pain Assessment ?Pain Assessment: Faces ?Faces Pain Scale: Hurts little more ?Pain Location: R LE in standing ?Pain Descriptors / Indicators: Grimacing ?Pain Intervention(s): Limited activity within patient's tolerance, Monitored during session ? ?Home Living   ?  ?  ?  ?  ?  ?  ?  ?  ?  ?  ?  ?  ?  ?  ?  ?  ?  ?  ? ?  ?Prior Functioning/Environment    ?  ?  ?  ?   ? ?Frequency ? Min 2X/week  ? ? ? ? ?  ?Progress Toward Goals ? ?OT Goals(current goals can now be found in the care plan section) ? Progress towards OT goals: Progressing toward goals ? ?Acute Rehab OT Goals ?Patient Stated Goal: be able to get back home independently ?OT Goal Formulation: With patient ?Time For Goal Achievement: 03/04/22 ?Potential to Achieve Goals: Good ?ADL Goals ?Pt  Will Perform Lower Body Bathing: with supervision;sitting/lateral leans;sit to/from stand ?Pt Will Perform Lower Body Dressing: with supervision;sit to/from stand ?Pt Will Transfer to Toilet: with supervision;stand pivot transfer;bedside commode ?Additional ADL Goal #1: Pt to demo ability to safely use wheelchair to gather ADL/IADL items in room with Supervision  ?Plan Discharge plan remains appropriate   ? ?Co-evaluation ? ? ?   ?  ?  ?  ?  ? ?  ?AM-PAC OT "6 Clicks" Daily Activity     ?Outcome Measure ? ? Help from another person eating meals?: A Little ?Help from another person taking care of personal grooming?: A Little ?Help from another person toileting, which includes using toliet, bedpan, or urinal?: Total ?Help from another person bathing (including washing, rinsing, drying)?: A Lot ?Help from another person to put on and taking off regular upper body clothing?: A Little ?Help from another person to put on and taking off regular lower body clothing?: A Lot ?6 Click Score: 14 ? ?  ?End of Session Equipment Utilized During Treatment: Rolling walker (2 wheels) ? ?OT Visit Diagnosis:  Unsteadiness on feet (R26.81);Other abnormalities of gait and mobility (R26.89);Muscle weakness (generalized) (M62.81);Other symptoms and signs involving cognitive function ?  ?Activity Tolerance Patient tolerated treatment well ?  ?Patient Left in bed;with call bell/phone within reach;with bed alarm set;with nursing/sitter in room ?  ?Nurse Communication Mobility status;Weight bearing status;Precautions ?  ? ?   ? ?Time: 1504-1364 ?OT Time Calculation (min): 23 min ? ?Charges: OT General Charges ?$OT Visit: 1 Visit ?OT Treatments ?$Self Care/Home Management : 8-22 mins ?$Therapeutic Activity: 8-22 mins ? ?[name] [name], OTR/L ?Acute Rehab Services ?Office: [phone]  ? ?[name] [name] ?02/18/2022, 9:00 AM ?

## 2022-02-18 NOTE — Progress Notes (Signed)
Physical Therapy Treatment ?Patient Details ?Name: [name] BEVERIDGE Sr. ?MRN: [id number] ?DOB: 10-24-1961 ?Today's Date: 02/18/2022 ? ? ?History of Present Illness 61 y.o. male  presents 02/02/22 with altered mental status. Pt found to have osteomyelitis of R foot, sepsis and acute respiratory failure with hypoxia with acute metabolic encephalopathy. 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 Transferred to ICU after LoC and hypotension systolic in 17E placed on 5L O2 and given Narcan. 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 PMH: diabetes mellitus type 2, CHF, ESRD, hypertension, myocardial infarction, nonischemic cardiomyopathy. s/p Right irrigation and debridement with removal of residual bone at 4-5 metatarsals on 3/25. ? ?  ?PT Comments  ? ? Focus of session today was functional bed mobility, STS, standing balance, and R LE open chain exercises. The patient tolerated well but presents with a flat affect and drowsy throughout session. He required multiple seated rest breaks after each brief bout of activity. He was able to perform multiple STS and tolerate standing balance this session. Pt. Shows overall improvement with his transfer ability, bed mobility, and sitting balance. Overall functional strength, endurance, balance, and R LE weight bearing status are still limiting function. Pt. Would benefit from skilled PT to continue to address his functional strength, balance, transfer ability, and gait. Plan and discharge setting remains unchanged. Pt to follow acutely as appropriate.  ?   ?Recommendations for follow up therapy are one component of a multi-disciplinary discharge planning process, led by the attending physician.  Recommendations may be updated based on patient status, additional functional criteria and insurance authorization. ? ?Follow Up Recommendations ? Skilled nursing-short term rehab (<3 hours/day) ?  ?  ?Assistance Recommended at Discharge Frequent or constant  Supervision/Assistance  ?Patient can return home with the following Two people to help with walking and/or transfers;Two people to help with bathing/dressing/bathroom;Assistance with cooking/housework;Assistance with feeding;Direct supervision/assist for medications management;Direct supervision/assist for financial management;Assist for transportation;Help with stairs or ramp for entrance ?  ?Equipment Recommendations ? Rolling walker (2 wheels);BSC/3in1  ?  ?Recommendations for Other Services   ? ? ?  ?Precautions / Restrictions Precautions ?Precautions: Fall ?Precaution Comments: WBAT for transfers only R LE heel per podiatry note on 4/4 ?Required Braces or Orthoses: Other Brace ?Other Brace: Post op Shoe ?Restrictions ?Weight Bearing Restrictions: Yes ?RLE Weight Bearing: Non weight bearing (during gait if doing gait training) ?Other Position/Activity Restrictions: WBAT transfers only  ?  ? ?Mobility ? Bed Mobility ?Overal bed mobility: Needs Assistance ?Bed Mobility: Supine to Sit, Sit to Supine ?  ?  ?Supine to sit: Min guard, HOB elevated ?Sit to supine: Min guard, HOB elevated ?  ?General bed mobility comments: Min G for bed mobility with increased time and set up assistance ?Patient Response: Flat affect ? ?Transfers ?Overall transfer level: Needs assistance ?  ?Transfers: Sit to/from Stand ?Sit to Stand: Min assist ?  ?  ?  ?  ?  ?General transfer comment: Pt. requires cuing for proper use of RW and sequencing during STS. STSx 3. Min A for tactile cues for stand and sit towards HOB at the end of session. ?  ? ?Ambulation/Gait ?  ?  ?  ?  ?  ?  ?  ?  ? ? ?Stairs ?  ?  ?  ?  ?  ? ? ?Wheelchair Mobility ?  ? ?Modified Rankin (Stroke Patients Only) ?  ? ? ?  ?Balance Overall balance assessment: Needs assistance ?Sitting-balance support: No upper extremity supported, Feet supported ?  Sitting balance-Leahy Scale: Good ?Sitting balance - Comments: Maintains sitting balance EOB during seated recovery ?  ?Standing  balance support: Bilateral upper extremity supported, During functional activity, Reliant on assistive device for balance ?Standing balance-Leahy Scale: Poor ?Standing balance comment: Requires constant assist to maintain standing balance ?  ?  ?  ?  ?  ?  ?  ?  ?  ?  ?  ?  ? ?  ?Cognition Arousal/Alertness: Awake/alert ?Behavior During Therapy: Flat affect ?Overall Cognitive Status: Impaired/Different from baseline ?Area of Impairment: Safety/judgement, Awareness, Following commands, Problem solving ?  ?  ?  ?  ?  ?  ?  ?  ?  ?  ?  ?Following Commands: Follows one step commands consistently, Follows multi-step commands with increased time ?Safety/Judgement: Decreased awareness of safety, Decreased awareness of deficits ?Awareness: Intellectual ?Problem Solving: Slow processing, Decreased initiation ?  ?  ?  ? ?  ?Exercises Other Exercises ?Other Exercises: Standing R LE: Hip flexion x10, Hip Abduction x10, Knee flexion x10 ? ?  ?General Comments General comments (skin integrity, edema, etc.): VSS on RA, pt. seemed drowsy throughout session ?  ?  ? ?Pertinent Vitals/Pain Pain Assessment ?Faces Pain Scale: Hurts a little bit ?Pain Location: R LE in standing ?Pain Descriptors / Indicators: Grimacing, Aching, Guarding ?Pain Intervention(s): Limited activity within patient's tolerance, Monitored during session  ? ? ?Home Living   ?  ?  ?  ?  ?  ?  ?  ?  ?  ?   ?  ?Prior Function    ?  ?  ?   ? ?PT Goals (current goals can now be found in the care plan section) Acute Rehab PT Goals ?PT Goal Formulation: Patient unable to participate in goal setting ?Time For Goal Achievement: 02/27/22 ?Potential to Achieve Goals: Good ?Progress towards PT goals: Progressing toward goals ? ?  ?Frequency ? ? ? Min 2X/week ? ? ? ?  ?PT Plan Current plan remains appropriate  ? ? ?Co-evaluation   ?  ?  ?  ?  ? ?  ?AM-PAC PT "6 Clicks" Mobility   ?Outcome Measure ? Help needed turning from your back to your side while in a flat bed without  using bedrails?: A Little ?Help needed moving from lying on your back to sitting on the side of a flat bed without using bedrails?: A Little ?Help needed moving to and from a bed to a chair (including a wheelchair)?: A Lot ?Help needed standing up from a chair using your arms (e.g., wheelchair or bedside chair)?: A Little ?Help needed to walk in hospital room?: Total ?Help needed climbing 3-5 steps with a railing? : Total ?6 Click Score: 13 ? ?  ?End of Session Equipment Utilized During Treatment: Gait belt ?Activity Tolerance: Patient limited by fatigue ?Patient left: in bed;with call bell/phone within reach;with bed alarm set ?Nurse Communication: Mobility status ?PT Visit Diagnosis: Muscle weakness (generalized) (M62.81);Difficulty in walking, not elsewhere classified (R26.2) ?  ? ? ?Time: 0712-1975 ?PT Time Calculation (min) (ACUTE ONLY): 25 min ? ?Charges:  $Therapeutic Exercise: 8-22 mins ?$Therapeutic Activity: 8-22 mins          ?          ? ?Thermon Leyland, SPT ?Acute Rehab Services ? ? ? ?Thermon Leyland ?02/18/2022, 4:16 PM ? ?

## 2022-02-18 NOTE — Progress Notes (Signed)
?[facility] ?Progress Note  ? ?Subjective: No interval events.  Awaiting SNF. Seen in room, no c/o's.  ? ?Objective ?Vitals:  ? 02/18/22 0030 02/18/22 0325 02/18/22 0720 02/18/22 1202  ?BP: 138/71 (!) 150/63 (!) 140/55 137/71  ?Pulse: 86 82 81 79  ?Resp: 16 18 20    ?Temp: 98.6 ?F (37 ?C) 98.6 ?F (37 ?C) 98.2 ?F (36.8 ?C) 98.2 ?F (36.8 ?C)  ?TempSrc: Oral Oral Oral Oral  ?SpO2: 98% 98% 98% 100%  ?Weight:      ?Height:      ? ?Physical Exam ?General: NAD ?Heart: S1,S2 RRR No M/R/G ?Lungs: No WOB. CTAB ?Abdomen: Obese, NT, ND ?Extremities: No LE edema. Wound VAC intact on foot.  ?Dialysis Access: L AVF +[facility] ? ?OP HD:  MWF [facility] ?4h 55min  450/500    117.8kg   2/ 2.5Ca bath L AVF Hep 6000  ? - on 3/19 > hep B Ag neg, and Hep B Ab's high/ protective ? - calcitriol 1.28mcg PO qHD-last 01/31/22 ?  ?Assessment/Plan: ?R foot osteomyelitis- on IV vanc/cefepime/Flagyl. + Morganella bacteremia 2/2 foot abscess. S/p R foot I&D on 3/19 by podiatry and another I&D done 3/25 now with wound vac. Will need 6 weeks ABX from 3/25 for attempted limb salvage - IV vanc/cefepime + PO Flagyl ?Acute metabolic encephalopathy: Improved.  ?CVA: Neurology consulted. MRI done 02/04/2022 Numerous bilat punctate acute infarctions. Per primary. Avoid hypotension ?ESRD - on HD MWF. HD tomorrow.  ?HTN/ vol - euvolemic on exam. Avoid hypotension during HD. Keep SBP up on HD as is possible. Under OP EDW. Will need EDW reset on discharge.  ?Anemia of CKD - Hb drifting down, ESA darbe cont qWed, ?Secondary Hyperparathyroidism -  CCa okay. Continue VDRA with HD. Phos stable ?HD access: L AVF w/ thinning, ulcerated area at the lower portion. Stick away from area. VVS consulted and went for fistulogram on 3/23 by [facility] > they did central venoplasty to the L innominate and subclavian veins, and peripheral venoplasty to the cephalic vein.  ? ?[name] Splinter, MD ?02/18/2022, 12:12 PM ? ? ? ?Basic Metabolic Panel: ?Recent Labs  ?Lab 02/16/22 ?0238  02/17/22 ?5361 02/18/22 ?0436  ?NA 134* 134* 134*  ?K 4.9 4.6 4.2  ?CL 98 98 96*  ?CO2 25 24 28   ?GLUCOSE 218* 137* 216*  ?BUN 27* 38* 25*  ?CREATININE 10.05* 12.30* 8.52*  ?CALCIUM 8.5* 8.9 8.9  ?PHOS 3.5 4.1 3.2  ? ? ?No results for input(s): LIPASE, AMYLASE in the last 168 hours. ?CBC: ?Recent Labs  ?Lab 02/14/22 ?4431 02/15/22 ?5400 02/16/22 ?0238 02/17/22 ?8676 02/18/22 ?0436  ?WBC 10.5 11.2* 10.7* 11.2* 9.4  ?NEUTROABS 6.8 7.3 6.7 7.5 5.8  ?HGB 8.3* 8.5* 9.0* 8.7* 8.9*  ?HCT 26.3* 26.9* 27.7* 27.9* 28.6*  ?MCV 94.9 96.1 96.2 96.9 97.9  ?PLT 455* 444* 503* 511* 466*  ? ? ?Iron Studies: No results for input(s): IRON, TIBC, TRANSFERRIN, FERRITIN in the last 72 hours. ?@lablastinr3 @ ? ?Medications: ? ceFEPime (MAXIPIME) IV Stopped (02/17/22 2121)  ? sodium chloride    ? ? (feeding supplement) PROSource Plus  30 mL Oral BID BM  ? vitamin C  500 mg Oral Daily  ? aspirin EC  81 mg Oral Daily  ? Chlorhexidine Gluconate Cloth  6 each Topical Q0600  ? darbepoetin (ARANESP) injection - DIALYSIS  100 mcg Intravenous Q Wed-HD  ? famotidine  20 mg Oral Daily  ? feeding supplement (NEPRO CARB STEADY)  237 mL Oral BID BM  ?  heparin  5,000 Units Subcutaneous Q8H  ? insulin aspart  0-15 Units Subcutaneous TID WC  ? insulin aspart  0-5 Units Subcutaneous QHS  ? [START ON 02/19/2022] insulin glargine-yfgn  22 Units Subcutaneous Daily  ? metroNIDAZOLE  500 mg Oral Q12H  ? midodrine  10 mg Oral Q M,W,F-HD  ? multivitamin  1 tablet Oral QHS  ? pantoprazole  40 mg Oral Q1200  ? polyethylene glycol  17 g Oral BID  ? senna-docusate  1 tablet Oral BID  ? sorbitol, milk of mag, mineral oil, glycerin (SMOG) enema  960 mL Rectal Once  ? sucroferric oxyhydroxide  1,000 mg Oral TID WC  ? thiamine injection  100 mg Intravenous Daily  ? Or  ? thiamine  100 mg Oral Daily  ? ? ? ? ? ? ? ? ?  ? ?

## 2022-02-18 NOTE — Progress Notes (Signed)
?PROGRESS NOTE ? ? ? ?[name] HEMANN Sr.  VPX:[id number] DOB: 1961/06/30 DOA: 02/02/2022 ?PCP: [facility]  ? ? ?Chief Complaint  ?Patient presents with  ? Altered Mental Status  ? ? ?Brief Narrative:  ?61 y/o male history of DM, ESRD on HD, admitted to the hospital with fever, acute encephalopathy, sepsis and right foot infection.  He has a history of prior right fifth metatarsal amputation performed many years ago in [place].  Plain films of his foot indicate swelling and subcutaneous gas without any obvious bony erosion. He [facility] presented to [facility] and subsequently transferred to [facility].  Podiatry consulted, he was taken emergently to the [facility] on 3/19.  Postoperatively he has had persistent lethargy and was taken to the ICU 3/19 evening.  He was placed on Narcan drip for presumed lethargy due to opioid use however he only got 5 mg of oxycodone.  Narcan drip was discontinued on 3/20 and he was transferred out of the ICU. He continued to have altered mental status and was found to have findings consistent with acute strokes.  Neurology was consulted and suspects related to hypotension.  His mental status has gradually improved.  Blood cultures were positive with morganella and clostridium related to his foot infection.  ID has been consulted and recommended 6 weeks of abx after last debridement.  He went back to OR on 3/25 for I&D with podiatry.  Hospitalization c/b malfunctioning L AV fistula, he's s/p fistulogram with vascular on 3/23.  At this time, plan is for d/c to SNF when bed available.  He had nausea/vomiting of unclear etiology 3/28, workup pending. ? ?See below for additional details ?   ? ? ?Assessment & Plan: ? Principal Problem: ?  Osteomyelitis ([place]) ?Active Problems: ?  Severe sepsis ([place]) ?  Nausea & vomiting ?  Hypotension ?  Acute metabolic encephalopathy ?  Stroke [facility]) ?  ESRD (end stage renal disease) ([place]) ?  DM2 (diabetes mellitus, type 2) ([place]) ?  HTN  (hypertension) ?  Chronic systolic CHF (congestive heart failure) ([place]) ?  Acute respiratory failure with hypoxia ([place]) ?  Protein-calorie malnutrition, mild ([place]) ?  GERD (gastroesophageal reflux disease) ?  Carotid stenosis ?  Hyponatremia ?  Hyperkalemia ?  High anion gap metabolic acidosis ?  Anemia of chronic renal failure ?  Necrotizing soft tissue infection ?  Cellulitis in diabetic foot ([place]) ?  Bacteremia ?  Right foot infection ? ? ? 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 ?Appreciate ID assistance, continuing vanc, flagyl, and cefepime - low suspicion for endocarditis per ID, TTE without eivdence of valvular vegetations on TTE (per ID, "do not feel strongly regarding getting TEE from ID standpoint given low grade bacteremia with GNR/GPR with right  foot being source, would defer need of TEE to neurology if helpful for stroke workup - neurology did not recommend from their standpoint).  Recommending 6 weeks of abx after last debridement intervention (from 3/25).--Vancomycin/cefepime during HD and p.o. Flagyl ? ? ?Hypotension ?Post op, likely multifactorial, blood loss,  meds ?He was weaned off pressors after small bolus post op 3/25 ?Will continue to trend Hb/hct ?Blood pressure stable and improving and now starting to trend up. ?-Monitor for now. ?Continue midodrine 10 mg pre HD. ? ?Nausea & vomiting ?Unclear etiology ?Likely secondary to constipation/fecal impaction. ?KUB with stool in rectosigmoid concerning for constipation/fecal impaction ?Disimpaction/enema ordered with good results. ?-Improved clinically. ?-Patient denies any further nausea or vomiting. ?-Patient diet advanced to a soft diet which he is tolerating. ?-Continue current bowel regimen of MiraLAX twice daily, Senokot-S twice daily. ?-IV antiemetics as needed.  Supportive care. ? ?Stroke [facility]) ?MRI brain 3/22 with numerous punctate acute infarctions throughout both cerebral hemispheres, primarily affecting the deep brain ?Echo with Ef 45-50%, global hypokinesis, RVSF mildly reduced ?CTA neck 3/20 without LVO or hemodynamically significant stenosis of intracranial arteries, did have 70% stenosis for R ICA due to mixed density atherosclerosis ?CT venogram normal ?LDL 46, A1c 12.2 ?Appreciate neurology recommendations - suspect likely 2/2 hypoperfusion with intermittent hypotension.  Aspirin 81 mg daily.  No statin at this time, LDL at goal. ?Will need outpatient follow-up with neurology. ? ? ?Acute metabolic encephalopathy ?Related to stroke as well as infection and acute hospitalization ?Clinical improvement. 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 ? ? ?ESRD (end stage renal disease) ([place]) ?Per renal ?Being evaluated outpatient for possible transplant ?S/p L arm fistulogram with vascular 3/23 -> L central angioplasty innominate and subclavian vein, L peripheral angioplasty cephalic vein ?-Patient with hemodialysis  Monday Wednesday Friday schedule.   ?-Per nephrology. ? ?DM2 (diabetes mellitus, type 2) ([place]) ?Hemoglobimn a1c 12.2 (02/02/2022). ?CBG 228 this morning. ?Increase Semglee to 22 units daily.   ?-SSI.  ? ? ?HTN (hypertension) ?Intermittent hypotension, concern that this may have resulted in watershed infarcts. ?-Blood pressure stable. ?-Hydralazine as needed. ?-Continue midodrine pre-HD. ? ?Acute respiratory failure with hypoxia ([place]) ?Currently has been weaned to RA with sats of 98-100%, continue to monitor ?CXR 3/20 without frank edema ? ?Chronic systolic CHF (congestive heart failure) ([place]) ?EF 45% per echo 05/2021 ?Echo 3/22 with EF 45-50% ?Fluid management per nephrology. ? ?Carotid stenosis ?-Outpatient vascular follow up ? ?GERD (gastroesophageal reflux disease) ?- PPI, Pepcid. ?-GI cocktail as needed. ? ?Protein-calorie malnutrition, mild ([place]) ?- Nutritional supplementation. ? ?Necrotizing soft tissue infection ?- See above osteomyelitis/sepsis ? ?Anemia of chronic renal failure ?- H&H stable at 8.9. ? ?Hyperkalemia ?- On hemodialysis. ?-Potassium of 4.2 today. ? ?Hyponatremia ?- Improving on HD. ?-Sodium at 134. ? ? ? ? ?  ? ? ?DVT prophylaxis: Heparin ?Code Status: Full ?Family Communication: Updated patient.  Updated brother at bedside.  ?Disposition: SNF when bed available.   ? ?Status is: Inpatient ?Remains inpatient appropriate because: Severity of illness. ?  ?Consultants:  ?Podiatry: Dr. Blenda Mounts 02/02/2022 ?Nephrology: Dr. Joylene Grapes 02/02/2022 ?PCCM: Dr.Ismail 02/03/2022 ?Infectious disease: Dr.Manandhar 02/03/2022 ?Neurology: Dr.Collins 02/04/2022 ?Vascular surgery: Dr.Brabham 02/05/2022 ? ?Procedures:  ?CT head 02/02/2022 ?CT angiogram head and neck 02/03/2022 ?Plain films of the right foot 02/02/2022, 02/08/2022 ?Chest x-ray 02/03/2022 ?Abdominal films 02/04/2022, 02/11/2022 ?MRI right foot/MRI right ankle 02/06/2022 ?  2D echo 02/05/2022 ?MRI brain 02/04/2022 ?Left brachiocephalic AV fistula cannulation under  ultrasound guidance/lifestyle fistulogram including central venogram/left central angioplasty innominate and subclavian vein/left peripheral angioplasty cephalic vein per Dr. Carlis Abbott vascular surgeon 02/06/2022 ?CT venogram

## 2022-02-19 DIAGNOSIS — J9601 Acute respiratory failure with hypoxia: Secondary | ICD-10-CM | POA: Diagnosis not present

## 2022-02-19 DIAGNOSIS — E669 Obesity, unspecified: Secondary | ICD-10-CM

## 2022-02-19 DIAGNOSIS — N186 End stage renal disease: Secondary | ICD-10-CM | POA: Diagnosis not present

## 2022-02-19 DIAGNOSIS — R7881 Bacteremia: Secondary | ICD-10-CM | POA: Diagnosis not present

## 2022-02-19 DIAGNOSIS — G9341 Metabolic encephalopathy: Secondary | ICD-10-CM | POA: Diagnosis not present

## 2022-02-19 LAB — GLUCOSE, CAPILLARY
Glucose-Capillary: 140 mg/dL — ABNORMAL HIGH (ref 70–99)
Glucose-Capillary: 97 mg/dL (ref 70–99)
Glucose-Capillary: 144 mg/dL — ABNORMAL HIGH (ref 70–99)
Glucose-Capillary: 148 mg/dL — ABNORMAL HIGH (ref 70–99)

## 2022-02-19 LAB — RENAL FUNCTION PANEL
Albumin: 2.2 g/dL — ABNORMAL LOW (ref 3.5–5.0)
Anion gap: 12 (ref 5–15)
BUN: 41 mg/dL — ABNORMAL HIGH (ref 6–20)
CO2: 25 mmol/L (ref 22–32)
Calcium: 9 mg/dL (ref 8.9–10.3)
Chloride: 94 mmol/L — ABNORMAL LOW (ref 98–111)
Creatinine, Ser: 10.6 mg/dL — ABNORMAL HIGH (ref 0.61–1.24)
GFR, Estimated: 5 mL/min — ABNORMAL LOW (ref >60)
Glucose, Bld: 144 mg/dL — ABNORMAL HIGH (ref 70–99)
Phosphorus: 3.8 mg/dL (ref 2.5–4.6)
Potassium: 4.1 mmol/L (ref 3.5–5.1)
Sodium: 131 mmol/L — ABNORMAL LOW (ref 135–145)

## 2022-02-19 LAB — CBC WITH DIFFERENTIAL/PLATELET
Abs Immature Granulocytes: 0.04 10*3/uL (ref 0.00–0.07)
Basophils Absolute: 0.1 10*3/uL (ref 0.0–0.1)
Basophils Relative: 1 %
Eosinophils Absolute: 0.6 10*3/uL — ABNORMAL HIGH (ref 0.0–0.5)
Eosinophils Relative: 7 %
HCT: 26.9 % — ABNORMAL LOW (ref 39.0–52.0)
Hemoglobin: 8.6 g/dL — ABNORMAL LOW (ref 13.0–17.0)
Immature Granulocytes: 1 %
Lymphocytes Relative: 19 %
Lymphs Abs: 1.6 10*3/uL (ref 0.7–4.0)
MCH: 30.6 pg (ref 26.0–34.0)
MCHC: 32 g/dL (ref 30.0–36.0)
MCV: 95.7 fL (ref 80.0–100.0)
Monocytes Absolute: 1.4 10*3/uL — ABNORMAL HIGH (ref 0.1–1.0)
Monocytes Relative: 17 %
Neutro Abs: 4.4 10*3/uL (ref 1.7–7.7)
Neutrophils Relative %: 55 %
Platelets: 439 10*3/uL — ABNORMAL HIGH (ref 150–400)
RBC: 2.81 MIL/uL — ABNORMAL LOW (ref 4.22–5.81)
RDW: 17.4 % — ABNORMAL HIGH (ref 11.5–15.5)
WBC: 8.1 10*3/uL (ref 4.0–10.5)
nRBC: 0 % (ref 0.0–0.2)

## 2022-02-19 MED ORDER — HEPARIN SODIUM (PORCINE) 1000 UNIT/ML IJ SOLN
INTRAMUSCULAR | Status: AC
  Administered 2022-02-19: 1000 [IU]
  Filled 2022-02-19: qty 3

## 2022-02-19 NOTE — Assessment & Plan Note (Signed)
See Above

## 2022-02-19 NOTE — Assessment & Plan Note (Signed)
-  As Above ?

## 2022-02-19 NOTE — Progress Notes (Signed)
SLP Cancellation Note ? ?Patient Details ?Name: [name] LATONA Sr. ?MRN: [id number] ?DOB: 05-02-1961 ? ? ?Cancelled treatment:        Pt in dialysis currently. Will continue efforts.  ? ? ?Houston Siren ?02/19/2022, 9:18 AM ?

## 2022-02-19 NOTE — Assessment & Plan Note (Signed)
-  Complicates overall prognosis and care ?-Estimated body mass index is 31.99 kg/m? as calculated from the following: ?  Height as of this encounter: 6\' 1"  (1.854 m). ?  Weight as of this encounter: 110 kg.  ?-Weight Loss and Dietary Counseling given ? ?

## 2022-02-19 NOTE — Progress Notes (Signed)
?[facility] ?Progress Note  ? ?Subjective: stable, seen on HD. No c/o.  ? ?Objective ?Vitals:  ? 02/19/22 1030 02/19/22 1100 02/19/22 1130 02/19/22 1200  ?BP: (!) 179/84 (!) 179/84 139/71 (!) 120/56  ?Pulse: 81 81 84 81  ?Resp: 16 16    ?Temp:      ?TempSrc:      ?SpO2:      ?Weight:      ?Height:      ? ?Physical Exam ?General: NAD ?Heart: S1,S2 RRR No M/R/G ?Lungs: No WOB. CTAB ?Abdomen: Obese, NT, ND ?Extremities: No LE edema. Wound VAC intact on foot.  ?Dialysis Access: L AVF +[place] ? ?OP HD:  MWF [facility] ?4h 28min  450/500    117.8kg   2/ 2.5Ca bath L AVF Hep 6000  ? - on 3/19 > hep B Ag neg, and Hep B Ab's high/ protective ? - calcitriol 1.80mcg PO qHD-last 01/31/22 ?  ?Assessment/Plan: ?R foot osteomyelitis- on IV vanc/cefepime/Flagyl. + Morganella bacteremia 2/2 foot abscess. S/p R foot I&D on 3/19 by podiatry and another I&D done 3/25 now with wound vac. Will need 6 weeks ABX from 3/25 for attempted limb salvage - IV vanc/cefepime + PO Flagyl ?Acute metabolic encephalopathy- improved.  ?CVA: Neurology consulted. MRI done 02/04/2022 Numerous bilat punctate acute infarctions. Per primary. Avoid hypotension ?ESRD - on HD MWF. HD today.  ?HTN/ vol - euvolemic on exam. Avoid hypotension during HD. Keep SBP up on HD as is possible. Under OP EDW. Will need EDW reset on discharge.  ?Anemia of CKD - Hb drifting down, ESA darbe cont qWed, ?Secondary Hyperparathyroidism -  CCa okay. Continue VDRA with HD. Phos stable ?HD access: L AVF w/ thinning, ulcerated area at the lower portion. Stick away from area. VVS consulted and went for fistulogram on 3/23 by VVS > they did venoplasty to the stenotic areas.  ? ?[name] Splinter, MD ?02/19/2022, 12:17 PM ? ? ? ?Basic Metabolic Panel: ?Recent Labs  ?Lab 02/17/22 ?3557 02/18/22 ?3220 02/19/22 ?2542  ?NA 134* 134* 131*  ?K 4.6 4.2 4.1  ?CL 98 96* 94*  ?CO2 24 28 25   ?GLUCOSE 137* 216* 144*  ?BUN 38* 25* 41*  ?CREATININE 12.30* 8.52* 10.60*  ?CALCIUM 8.9 8.9 9.0  ?PHOS  4.1 3.2 3.8  ? ? ?No results for input(s): LIPASE, AMYLASE in the last 168 hours. ?CBC: ?Recent Labs  ?Lab 02/15/22 ?7062 02/16/22 ?0238 02/17/22 ?3762 02/18/22 ?8315 02/19/22 ?1761  ?WBC 11.2* 10.7* 11.2* 9.4 8.1  ?NEUTROABS 7.3 6.7 7.5 5.8 4.4  ?HGB 8.5* 9.0* 8.7* 8.9* 8.6*  ?HCT 26.9* 27.7* 27.9* 28.6* 26.9*  ?MCV 96.1 96.2 96.9 97.9 95.7  ?PLT 444* 503* 511* 466* 439*  ? ? ?Iron Studies: No results for input(s): IRON, TIBC, TRANSFERRIN, FERRITIN in the last 72 hours. ?@lablastinr3 @ ? ?Medications: ? ceFEPime (MAXIPIME) IV Stopped (02/17/22 2121)  ? sodium chloride    ? ? (feeding supplement) PROSource Plus  30 mL Oral BID BM  ? vitamin C  500 mg Oral Daily  ? aspirin EC  81 mg Oral Daily  ? Chlorhexidine Gluconate Cloth  6 each Topical Q0600  ? darbepoetin (ARANESP) injection - DIALYSIS  100 mcg Intravenous Q Wed-HD  ? famotidine  20 mg Oral Daily  ? feeding supplement (NEPRO CARB STEADY)  237 mL Oral BID BM  ? heparin  5,000 Units Subcutaneous Q8H  ? insulin aspart  0-15 Units Subcutaneous TID WC  ? insulin aspart  0-5 Units Subcutaneous QHS  ?  insulin glargine-yfgn  22 Units Subcutaneous Daily  ? metroNIDAZOLE  500 mg Oral Q12H  ? midodrine  10 mg Oral Q M,W,F-HD  ? multivitamin  1 tablet Oral QHS  ? pantoprazole  40 mg Oral Q1200  ? polyethylene glycol  17 g Oral BID  ? senna-docusate  1 tablet Oral BID  ? sorbitol, milk of mag, mineral oil, glycerin (SMOG) enema  960 mL Rectal Once  ? sucroferric oxyhydroxide  1,000 mg Oral TID WC  ? thiamine injection  100 mg Intravenous Daily  ? Or  ? thiamine  100 mg Oral Daily  ? ? ? ? ? ? ? ? ?  ? ?

## 2022-02-19 NOTE — Progress Notes (Addendum)
Contacted [facility] admissions this morning regarding approval for new clinic. Pt's case is still under review and approval/schedule is pending. Awaiting determination. Update provided to CSW. ? ?Melven Sartorius ?Renal Navigator ?[phone] ? ?Addendum at 2:39 pm: ?Pt has been accepted at [facility] on TTS schedule. Pt can start Saturday. Pt will need to arrive Saturday at 11:05 for 11:30 chair time. Update provided to CSW and nephrology staff via secure chat.  ?

## 2022-02-19 NOTE — Progress Notes (Signed)
Occupational Therapy Treatment ?Patient Details ?Name: [name] ERVEN Sr. ?MRN: [id number] ?DOB: September 16, 1961 ?Today's Date: 02/19/2022 ? ? ?History of present illness 61 y.o. male  presents 02/02/22 with altered mental status. Pt found to have osteomyelitis of R foot, sepsis and acute respiratory failure with hypoxia with acute metabolic encephalopathy. 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 Transferred to ICU after LoC and hypotension systolic in 40C placed on 5L O2 and given Narcan. 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 PMH: diabetes mellitus type 2, CHF, ESRD, hypertension, myocardial infarction, nonischemic cardiomyopathy. s/p Right irrigation and debridement with removal of residual bone at 4-5 metatarsals on 3/25. ?  ?OT comments ? Patient received in bed awaiting to go to HD but willing to work with OT from [place]. Patient was able to get to EOB with min guard assist and donn footwear with sock for LLE and post op shoe for RLE with mod assist. Patient performed sit to stands and  standing tolerance from EOB with min assist and able to maintain WB precautions on RLE. Acute OT to continue to follow.   ? ?Recommendations for follow up therapy are one component of a multi-disciplinary discharge planning process, led by the attending physician.  Recommendations may be updated based on patient status, additional functional criteria and insurance authorization. ?   ?Follow Up Recommendations ? Skilled nursing-short term rehab (<3 hours/day)  ?  ?Assistance Recommended at Discharge Frequent or constant Supervision/Assistance  ?Patient can return home with the following ? A lot of help with walking and/or transfers;A lot of help with bathing/dressing/bathroom;Assist for transportation;Help with stairs or ramp for entrance ?  ?Equipment Recommendations ? Wheelchair (measurements OT);Wheelchair cushion (measurements OT)  ?  ?Recommendations for Other Services   ? ?  ?Precautions / Restrictions  Precautions ?Precautions: Fall ?Precaution Comments: WBAT for transfers only R LE heel per podiatry note on 4/4 ?Required Braces or Orthoses: Other Brace ?Other Brace: Post op Shoe ?Restrictions ?Weight Bearing Restrictions: Yes ?RLE Weight Bearing: Non weight bearing ?Other Position/Activity Restrictions: WBAT transfers only  ? ? ?  ? ?Mobility Bed Mobility ?Overal bed mobility: Needs Assistance ?Bed Mobility: Supine to Sit, Sit to Supine ?  ?  ?Supine to sit: Min guard, HOB elevated ?Sit to supine: Min guard, HOB elevated ?  ?General bed mobility comments: patient able to get to EOB with min guard for shooting forward ?  ? ?Transfers ?Overall transfer level: Needs assistance ?Equipment used: Rolling walker (2 wheels) ?Transfers: Sit to/from Stand ?Sit to Stand: Min assist ?  ?  ?  ?  ?  ?General transfer comment: sit to stands performed from EOB to address toilet transfers with patient being able to maintain NWB and tolerating 1-2 minutes of standing ?  ?  ?Balance Overall balance assessment: Needs assistance ?Sitting-balance support: No upper extremity supported, Feet supported ?Sitting balance-Leahy Scale: Good ?  ?  ?Standing balance support: Bilateral upper extremity supported, During functional activity, Reliant on assistive device for balance ?Standing balance-Leahy Scale: Poor ?Standing balance comment: reliant on RW to maintain NWB ?  ?  ?  ?  ?  ?  ?  ?  ?  ?  ?  ?   ? ?ADL either performed or assessed with clinical judgement  ? ?ADL Overall ADL's : Needs assistance/impaired ?  ?  ?  ?  ?  ?  ?  ?  ?  ?  ?Lower Body Dressing: Moderate assistance;Sitting/lateral leans ?Lower Body Dressing Details (indicate cue type  and reason): assistance to donn sock on LLE and fasten post op shoe on right ?  ?  ?  ?  ?  ?  ?  ?General ADL Comments: treated from EOB due to expected to go to HD ?  ? ?Extremity/Trunk Assessment   ?  ?  ?  ?  ?  ? ?Vision   ?  ?  ?Perception   ?  ?Praxis   ?  ? ?Cognition Arousal/Alertness:  Awake/alert ?Behavior During Therapy: Flat affect ?Overall Cognitive Status: Impaired/Different from baseline ?Area of Impairment: Safety/judgement, Awareness, Following commands, Problem solving ?  ?  ?  ?  ?  ?  ?  ?  ?  ?  ?  ?Following Commands: Follows one step commands consistently, Follows multi-step commands with increased time ?Safety/Judgement: Decreased awareness of safety, Decreased awareness of deficits ?Awareness: Intellectual ?Problem Solving: Slow processing, Decreased initiation ?General Comments: increased time to follow commands ?  ?  ?   ?Exercises   ? ?  ?Shoulder Instructions   ? ? ?  ?General Comments    ? ? ?Pertinent Vitals/ Pain       Pain Assessment ?Pain Assessment: Faces ?Faces Pain Scale: Hurts a little bit ?Pain Location: R LE in standing ?Pain Descriptors / Indicators: Grimacing, Aching, Guarding ?Pain Intervention(s): Limited activity within patient's tolerance, Monitored during session, Repositioned ? ?Home Living   ?  ?  ?  ?  ?  ?  ?  ?  ?  ?  ?  ?  ?  ?  ?  ?  ?  ?  ? ?  ?Prior Functioning/Environment    ?  ?  ?  ?   ? ?Frequency ? Min 2X/week  ? ? ? ? ?  ?Progress Toward Goals ? ?OT Goals(current goals can now be found in the care plan section) ? Progress towards OT goals: Progressing toward goals ? ?Acute Rehab OT Goals ?Patient Stated Goal: get better ?OT Goal Formulation: With patient ?Time For Goal Achievement: 03/04/22 ?Potential to Achieve Goals: Good ?ADL Goals ?Pt Will Perform Grooming: with set-up;sitting ?Pt Will Perform Lower Body Bathing: with supervision;sitting/lateral leans;sit to/from stand ?Pt Will Perform Lower Body Dressing: with supervision;sit to/from stand ?Pt Will Transfer to Toilet: with supervision;stand pivot transfer;bedside commode ?Additional ADL Goal #1: Pt to demo ability to safely use wheelchair to gather ADL/IADL items in room with Supervision  ?Plan Discharge plan remains appropriate   ? ?Co-evaluation ? ? ?   ?  ?  ?  ?  ? ?  ?AM-PAC OT "6  Clicks" Daily Activity     ?Outcome Measure ? ? Help from another person eating meals?: A Little ?Help from another person taking care of personal grooming?: A Little ?Help from another person toileting, which includes using toliet, bedpan, or urinal?: A Lot ?Help from another person bathing (including washing, rinsing, drying)?: A Lot ?Help from another person to put on and taking off regular upper body clothing?: A Little ?Help from another person to put on and taking off regular lower body clothing?: A Lot ?6 Click Score: 15 ? ?  ?End of Session Equipment Utilized During Treatment: Rolling walker (2 wheels) ? ?OT Visit Diagnosis: Unsteadiness on feet (R26.81);Other abnormalities of gait and mobility (R26.89);Muscle weakness (generalized) (M62.81);Other symptoms and signs involving cognitive function ?  ?Activity Tolerance Patient tolerated treatment well ?  ?Patient Left in bed;with call bell/phone within reach;with bed alarm set ?  ?Nurse Communication Mobility status ?  ? ?   ? ?  Time: 0102-7253 ?OT Time Calculation (min): 18 min ? ?Charges: OT General Charges ?$OT Visit: 1 Visit ?OT Treatments ?$Self Care/Home Management : 8-22 mins ? ?Lodema Hong, OTA ?Acute Rehabilitation Services  ?Pager [phone] ?Office [phone] ? ? ?Trixie Dredge ?02/19/2022, 10:04 AM ? ? ?

## 2022-02-19 NOTE — Consult Note (Signed)
[place] Nurse wound follow up ?Patient receiving care in [facility] [id number] ?Wound type: Surgical ?Wound bed: Beefy red with bloody drainage ?Drainage (amount, consistency, odor)  ?Periwound: intact ?Dressing procedure/placement/frequency: ?Removed 1 black foam, replaced 1 piece of black foam after placing a piece of Mepitel. Drape applied, immediate suction obtained at 125 mmHg. ?More supplies ordered to be placed in the room. ?  ?WOC following MWF for vac dressing changes.  ?  ?Cathlean Marseilles Tamala Julian, MSN, RN, CMSRN, AGCNS, WTA ?Wound Treatment Associate ?Pager [phone]  ?

## 2022-02-19 NOTE — Progress Notes (Signed)
?PROGRESS NOTE ? ? ? ?[name] WESSELS Sr.  AYT:[id number] DOB: March 01, 1961 DOA: 02/02/2022 ?PCP: [facility]  ? ?Brief Narrative:  ?61 y/o male history of DM, ESRD on HD, admitted to the hospital with fever, acute encephalopathy, sepsis and right foot infection.  He has a history of prior right fifth metatarsal amputation performed many years ago in [place].  Plain films of his foot indicate swelling and subcutaneous gas without any obvious bony erosion. He initially presented to [facility] and subsequently transferred to [facility].  Podiatry consulted, he was taken emergently to the [facility] on 3/19.  Postoperatively he has had persistent lethargy and was taken to the ICU 3/19 evening.  He was placed on Narcan drip for presumed lethargy due to opioid use however he only got 5 mg of oxycodone.  Narcan drip was discontinued on 3/20 and he was transferred out of the ICU. He continued to have altered mental status and was found to have findings consistent with acute strokes.  Neurology was consulted and suspects related to hypotension.  His mental status has gradually improved.  Blood cultures were positive with morganella and clostridium related to his foot infection.  ID has been consulted and recommended 6 weeks of abx after last debridement.  He went back to OR on 3/25 for I&D with podiatry.  Hospitalization c/b malfunctioning L AV fistula, he's s/p fistulogram with vascular on 3/23.  At this time, plan is for d/c to SNF when bed available.  He had nausea/vomiting of unclear etiology 3/28 with now improving. ? ?See below for additional details ? ?**He continues to get dialysis and now has been accepted at [facility] on TTS schedule and we can start on Saturday; he was dialyzed today.  Will discharge to SNF once cleared by nephrology and possible dialysis session tomorrow.Marland Kitchen  His wound VAC is being changed and will continue wound VAC changes Monday Wednesday Friday.   ? ? ?Assessment and Plan: ?*  Osteomyelitis ([place]) ?-See Above ? 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 ?Appreciate ID assistance, continuing vanc, flagyl, and cefepime - low suspicion for endocarditis per ID, TTE without eivdence of valvular vegetations on TTE (per ID, "do not feel strongly regarding getting TEE from ID standpoint given low grade bacteremia with GNR/GPR with right foot being source, would defer need of TEE to neurology if helpful for stroke workup - neurology did not recommend from their standpoint).  Recommending 6 weeks of abx after last Debridement intervention (from 3/25). ?-Vancomycin/Cefepime during HD and p.o. Flagyl ? ? ?Hypotension ?-Post op, likely multifactorial, blood loss, meds ?-He was weaned off pressors after small bolus post op 3/25 ?-Will continue to trend Hb/hct ?-Blood pressure stable and improving and now starting to trend up. ?-Monitor for now. ?-Continue Midodrine  10 mg pre HD. ? ?Nausea & vomiting ?-Unclear etiology ?-Likely secondary to constipation/fecal impaction. ?-KUB with stool in rectosigmoid concerning for constipation/fecal impaction ?Disimpaction/enema ordered with good  results. ?-Improved clinically. ?-Patient denies any further nausea or vomiting. ?-Patient diet advanced to a soft diet which he is tolerating. ?-Continue current bowel regimen of MiraLAX twice daily, Senokot-S twice daily. ?-IV antiemetics as needed.   ?-C/w Supportive care. ? ?Stroke [facility]) ?-MRI brain 3/22 with numerous punctate acute infarctions throughout both cerebral hemispheres, primarily affecting the deep brain ?-Echo with Ef 45-50%, global hypokinesis, RVSF mildly reduced ?-CTA neck 3/20 without LVO or hemodynamically significant stenosis of intracranial arteries, did have 70% stenosis for R ICA due to mixed density atherosclerosis ?-CT venogram normal ?-LDL 46, A1c 12.2 ?-Appreciate neurology recommendations - suspect likely 2/2 hypoperfusion with intermittent hypotension.   ?-Aspirin 81 mg daily.  No statin at this time, -LDL at goal. ?-Will need outpatient follow-up with Neurology. ? ? ?Acute metabolic encephalopathy ?-Related to stroke as well as infection and acute hospitalization ?-Clinical improvement. 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 ? ? ?ESRD (end stage renal disease) ([place]) ?-Per renal ?-Being evaluated outpatient for possible transplant ?0S/p L arm fistulogram with vascular 3/23 -> L central angioplasty innominate and subclavian vein, L peripheral angioplasty cephalic vein ?-Patient with hemodialysis Monday Wednesday Friday schedule currently but will be changed to TTS Schedule   ?-Per nephrology. ? ?DM2 (diabetes mellitus, type 2) ([place] [address]) ?-Hemoglobimn a1c 12.2 (02/02/2022). ?-CBG's ranging from 97-202 ?-Increased Semglee to 22 units daily.   ?-C/w Moderate Novolog SSI AC/HS ? ? ?HTN (hypertension) ?-Intermittent hypotension, concern that this may have resulted in watershed  infarcts. ?-Blood pressure stable. ?-Hydralazine as needed. ?-Continue midodrine pre-HD. ?-Continue to Monitor BP per Protocol; Last BP reading was 143/79 ? ?Acute respiratory failure with hypoxia ([place]) ?-Currently has been weaned to RA with sats of 98-100%, continue to monitor ?-SpO2: 100 % ?O2 Flow Rate (L/min): 2 L/min; Now not on Supplemental O2 ?-CXR 3/20 without frank edema ? ?Chronic systolic CHF (congestive heart failure) ([place]) ?-EF 45% per echo 05/2021 ?-Echo 3/22 with EF 45-50% ?-Volume maintenance and Fluid management per Nephrology. ? ?Carotid stenosis ?-Outpatient vascular follow up ? ?GERD (gastroesophageal reflux disease) ?-PPI, Pepcid. ?-GI cocktail as needed. ? ?Protein-calorie malnutrition, mild ([place]) ?-Nutrition Status: ?Nutrition Problem: Increased nutrient needs ?Etiology: wound healing, chronic illness (CHF, ESRD) ?Signs/Symptoms: estimated needs ?Interventions: MVI, Nepro shake, Other (Comment) (zinc and vitamin C) ? ? ? ?Bacteremia ?-As Above ? ?Necrotizing soft tissue infection ?-See above Osteomyelitis/Sepsis ? ?Anemia of chronic renal failure ?-Hemoglobin/Hct went from 8.9/28.6 -> 8.6/26.9 ?-Continue to Monitor for S/Sx of Bleeding ?-No overt bleeding noted ?-Repeat CBC in the AM  ? ?Hyperkalemia ?-On hemodialysis. ?-Potassium of 4.1 today. ?-Continue to Monitor and Trend ?-Repeat CMP in the AM  ? ?Hyponatremia ?-Improving on HD. ?-Sodium at 134 yesterday and today is 131 ?-Continue to Monitor and Trend ?-Repeat CMP in the AM  ? ?DVT prophylaxis: heparin injection 5,000 Units Start: 02/02/22 0730 ?SCDs Start: 02/02/22 0716 ? ?  Code Status: Full Code ?Family Communication: No family present at bedside  ? ?Disposition Plan:  ?Level of care: Telemetry Medical ?Status is: Inpatient ?Remains inpatient appropriate because: Needs SNF placement with Dialysis Slot ?  ? ?Consultants:  ?Podiatry: Dr. Blenda Mounts 02/02/2022 ?Nephrology: Dr. Joylene Grapes 02/02/2022 ?PCCM: Dr.Ismail 02/03/2022 ?Infectious disease:  Dr.Manandhar 02/03/2022 ?Neurology: Dr.Collins 02/04/2022 ?Vascular surgery: Dr.Brabham 02/05/2022 ? ?Procedures:  ?CT head 02/02/2022 ?CT angiogram head and neck 02/03/2022 ?Plain films of the right foot 02/02/2022, 02/08/2022 ?Chest x-

## 2022-02-20 DIAGNOSIS — K2100 Gastro-esophageal reflux disease with esophagitis, without bleeding: Secondary | ICD-10-CM

## 2022-02-20 LAB — CBC WITH DIFFERENTIAL/PLATELET
Abs Immature Granulocytes: 0.03 10*3/uL (ref 0.00–0.07)
Basophils Absolute: 0.1 10*3/uL (ref 0.0–0.1)
Basophils Relative: 1 %
Eosinophils Absolute: 0.6 10*3/uL — ABNORMAL HIGH (ref 0.0–0.5)
Eosinophils Relative: 7 %
HCT: 28.6 % — ABNORMAL LOW (ref 39.0–52.0)
Hemoglobin: 8.7 g/dL — ABNORMAL LOW (ref 13.0–17.0)
Immature Granulocytes: 0 %
Lymphocytes Relative: 18 %
Lymphs Abs: 1.5 10*3/uL (ref 0.7–4.0)
MCH: 29.8 pg (ref 26.0–34.0)
MCHC: 30.4 g/dL (ref 30.0–36.0)
MCV: 97.9 fL (ref 80.0–100.0)
Monocytes Absolute: 1.3 10*3/uL — ABNORMAL HIGH (ref 0.1–1.0)
Monocytes Relative: 15 %
Neutro Abs: 4.9 10*3/uL (ref 1.7–7.7)
Neutrophils Relative %: 59 %
Platelets: 428 10*3/uL — ABNORMAL HIGH (ref 150–400)
RBC: 2.92 MIL/uL — ABNORMAL LOW (ref 4.22–5.81)
RDW: 17.4 % — ABNORMAL HIGH (ref 11.5–15.5)
WBC: 8.4 10*3/uL (ref 4.0–10.5)
nRBC: 0 % (ref 0.0–0.2)

## 2022-02-20 LAB — COMPREHENSIVE METABOLIC PANEL
ALT: 12 U/L (ref 0–44)
AST: 18 U/L (ref 15–41)
Albumin: 2.2 g/dL — ABNORMAL LOW (ref 3.5–5.0)
Alkaline Phosphatase: 52 U/L (ref 38–126)
Anion gap: 10 (ref 5–15)
BUN: 21 mg/dL — ABNORMAL HIGH (ref 6–20)
CO2: 27 mmol/L (ref 22–32)
Calcium: 8.7 mg/dL — ABNORMAL LOW (ref 8.9–10.3)
Chloride: 96 mmol/L — ABNORMAL LOW (ref 98–111)
Creatinine, Ser: 7.18 mg/dL — ABNORMAL HIGH (ref 0.61–1.24)
GFR, Estimated: 8 mL/min — ABNORMAL LOW (ref >60)
Glucose, Bld: 218 mg/dL — ABNORMAL HIGH (ref 70–99)
Potassium: 4 mmol/L (ref 3.5–5.1)
Sodium: 133 mmol/L — ABNORMAL LOW (ref 135–145)
Total Bilirubin: 0.3 mg/dL (ref 0.3–1.2)
Total Protein: 7.1 g/dL (ref 6.5–8.1)

## 2022-02-20 LAB — GLUCOSE, CAPILLARY
Glucose-Capillary: 228 mg/dL — ABNORMAL HIGH (ref 70–99)
Glucose-Capillary: 166 mg/dL — ABNORMAL HIGH (ref 70–99)

## 2022-02-20 LAB — MAGNESIUM: Magnesium: 1.9 mg/dL (ref 1.7–2.4)

## 2022-02-20 LAB — PHOSPHORUS: Phosphorus: 2.5 mg/dL (ref 2.5–4.6)

## 2022-02-20 MED ORDER — ASPIRIN 81 MG PO TBEC: 81.0000 mg | DELAYED_RELEASE_TABLET | Freq: Every day | ORAL | 11 refills | Status: DC

## 2022-02-20 MED ORDER — ONDANSETRON HCL 4 MG PO TABS: 4.0000 mg | ORAL_TABLET | Freq: Four times a day (QID) | ORAL | 0 refills | Status: DC | PRN

## 2022-02-20 MED ORDER — VANCOMYCIN VARIABLE DOSE PER UNSTABLE RENAL FUNCTION (PHARMACIST DOSING): Status: DC

## 2022-02-20 MED ORDER — NEPRO/CARBSTEADY PO LIQD: 237.0000 mL | Freq: Two times a day (BID) | ORAL | 0 refills | Status: DC

## 2022-02-20 MED ORDER — POLYETHYLENE GLYCOL 3350 17 G PO PACK: 17.0000 g | PACK | Freq: Two times a day (BID) | ORAL | 0 refills | Status: DC

## 2022-02-20 MED ORDER — METRONIDAZOLE 500 MG PO TABS: 500.0000 mg | ORAL_TABLET | Freq: Two times a day (BID) | ORAL | Status: DC

## 2022-02-20 MED ORDER — ASCORBIC ACID 500 MG PO TABS: 500.0000 mg | ORAL_TABLET | Freq: Every day | ORAL | Status: DC

## 2022-02-20 MED ORDER — ALBUTEROL SULFATE (2.5 MG/3ML) 0.083% IN NEBU
2.5000 mg | INHALATION_SOLUTION | RESPIRATORY_TRACT | 12 refills | Status: DC | PRN
Start: 2022-02-20 — End: 2022-05-16

## 2022-02-20 MED ORDER — SENNOSIDES-DOCUSATE SODIUM 8.6-50 MG PO TABS: 1.0000 | ORAL_TABLET | Freq: Two times a day (BID) | ORAL | Status: DC

## 2022-02-20 MED ORDER — PANTOPRAZOLE SODIUM 40 MG PO TBEC: 40.0000 mg | DELAYED_RELEASE_TABLET | Freq: Every day | ORAL | Status: DC

## 2022-02-20 MED ORDER — PROSOURCE PLUS PO LIQD: 30.0000 mL | Freq: Two times a day (BID) | ORAL | Status: DC

## 2022-02-20 MED ORDER — MIDODRINE HCL 10 MG PO TABS: 10.0000 mg | ORAL_TABLET | ORAL | Status: DC

## 2022-02-20 MED ORDER — RENA-VITE PO TABS: 1.0000 | ORAL_TABLET | Freq: Every day | ORAL | 0 refills | Status: DC

## 2022-02-20 MED ORDER — ACETAMINOPHEN 325 MG PO TABS: 650.0000 mg | ORAL_TABLET | Freq: Four times a day (QID) | ORAL | Status: DC | PRN

## 2022-02-20 MED ORDER — SODIUM CHLORIDE 0.9 % IV SOLN: 2.0000 g | INTRAVENOUS | Status: DC

## 2022-02-20 MED ORDER — THIAMINE HCL 100 MG/ML IJ SOLN: 100.0000 mg | Freq: Every day | INTRAMUSCULAR | Status: DC

## 2022-02-20 NOTE — Progress Notes (Addendum)
Contacted by CSW that pt may d/c to snf today. Contacted [facility] and made them aware that pt will need to start on Saturday. Attempted to meet with pt at bedside to discuss clinic details but pt was trying to sleep and was not up for talking. Pt advised he will be going to [facility] on TTS and left schedule letter for pt on over the bed table. Out-pt HD arrangements added to pt's AVS. Contacted renal NP regarding clinic's need for orders.  ? ?Melven Sartorius ?Renal Navigator ?[phone] ? ?Addendum at 2:24 pm: ?Contacted clinic to confirm that pt will d/c today and start on Saturday.  ?

## 2022-02-20 NOTE — Plan of Care (Signed)
?  Problem: Education: ?Goal: Knowledge of General Education information will improve ?Description: Including pain rating scale, medication(s)/side effects and non-pharmacologic comfort measures ?Outcome: Adequate for Discharge ?  ?Problem: Health Behavior/Discharge Planning: ?Goal: Ability to manage health-related needs will improve ?Outcome: Adequate for Discharge ?  ?Problem: Clinical Measurements: ?Goal: Ability to maintain clinical measurements within normal limits will improve ?Outcome: Adequate for Discharge ?Goal: Will remain free from infection ?Outcome: Adequate for Discharge ?Goal: Diagnostic test results will improve ?Outcome: Adequate for Discharge ?Goal: Respiratory complications will improve ?Outcome: Adequate for Discharge ?Goal: Cardiovascular complication will be avoided ?Outcome: Adequate for Discharge ?  ?Problem: Activity: ?Goal: Risk for activity intolerance will decrease ?Outcome: Adequate for Discharge ?  ?Problem: Nutrition: ?Goal: Adequate nutrition will be maintained ?Outcome: Adequate for Discharge ?  ?Problem: Coping: ?Goal: Level of anxiety will decrease ?Outcome: Adequate for Discharge ?  ?Problem: Elimination: ?Goal: Will not experience complications related to bowel motility ?Outcome: Adequate for Discharge ?Goal: Will not experience complications related to urinary retention ?Outcome: Adequate for Discharge ?  ?Problem: Pain Managment: ?Goal: General experience of comfort will improve ?Outcome: Adequate for Discharge ?  ?Problem: Safety: ?Goal: Ability to remain free from injury will improve ?Outcome: Adequate for Discharge ?  ?Problem: Skin Integrity: ?Goal: Risk for impaired skin integrity will decrease ?Outcome: Adequate for Discharge ?  ?Problem: Education: ?Goal: Knowledge of disease or condition will improve ?Outcome: Adequate for Discharge ?Goal: Knowledge of secondary prevention will improve (SELECT ALL) ?Outcome: Adequate for Discharge ?Goal: Knowledge of patient specific  risk factors will improve (INDIVIDUALIZE FOR PATIENT) ?Outcome: Adequate for Discharge ?  ?Problem: Coping: ?Goal: Will verbalize positive feelings about self ?Outcome: Adequate for Discharge ?Goal: Will identify appropriate support needs ?Outcome: Adequate for Discharge ?  ?Problem: Health Behavior/Discharge Planning: ?Goal: Ability to manage health-related needs will improve ?Outcome: Adequate for Discharge ?  ?Problem: Self-Care: ?Goal: Ability to participate in self-care as condition permits will improve ?Outcome: Adequate for Discharge ?Goal: Verbalization of feelings and concerns over difficulty with self-care will improve ?Outcome: Adequate for Discharge ?Goal: Ability to communicate needs accurately will improve ?Outcome: Adequate for Discharge ?  ?Problem: Nutrition: ?Goal: Risk of aspiration will decrease ?Outcome: Adequate for Discharge ?Goal: Dietary intake will improve ?Outcome: Adequate for Discharge ?  ?Problem: Ischemic Stroke/TIA Tissue Perfusion: ?Goal: Complications of ischemic stroke/TIA will be minimized ?Outcome: Adequate for Discharge ?  ?Problem: Education: ?Goal: Knowledge of disease or condition will improve ?Outcome: Adequate for Discharge ?Goal: Knowledge of secondary prevention will improve (SELECT ALL) ?Outcome: Adequate for Discharge ?Goal: Individualized Educational Video(s) ?Outcome: Adequate for Discharge ?  ?Problem: Coping: ?Goal: Will verbalize positive feelings about self ?Outcome: Adequate for Discharge ?Goal: Will identify appropriate support needs ?Outcome: Adequate for Discharge ?  ?Problem: Health Behavior/Discharge Planning: ?Goal: Ability to manage health-related needs will improve ?Outcome: Adequate for Discharge ?  ?Problem: Self-Care: ?Goal: Ability to participate in self-care as condition permits will improve ?Outcome: Adequate for Discharge ?Goal: Verbalization of feelings and concerns over difficulty with self-care will improve ?Outcome: Adequate for  Discharge ?Goal: Ability to communicate needs accurately will improve ?Outcome: Adequate for Discharge ?  ?Problem: Nutrition: ?Goal: Risk of aspiration will decrease ?Outcome: Adequate for Discharge ?Goal: Dietary intake will improve ?Outcome: Adequate for Discharge ?  ?Problem: Education: ?Goal: Knowledge of secondary prevention will improve (SELECT ALL) ?Outcome: Adequate for Discharge ?  ?

## 2022-02-20 NOTE — Progress Notes (Signed)
Report called to [name], Therapist, sports, at [facility].  RN to change wound vac to wet-to-dry dressing prior to discharge.  Pt waiting for [facility] transport.  AVS printed and placed in discharge packet.  Will continue to monitor pt until discharge to facility.   ?

## 2022-02-20 NOTE — Progress Notes (Signed)
Pharmacy Antibiotic Note ? ?GEOVANNIE VILAR Sr. is a 61 y.o. male admitted on 02/02/2022 with bacteremia secondary to foot abscess.  Pharmacy has been consulted for vancomycin and cefepime dosing. Patient has HD MWF.  ?Continue all abx for 6 weeks after last I&D (stop date: 03/22/22). ? ?3/31- vR 34 mcg/mL ?4/3  - vR 33 mcg/mL ? ?Plan: ?Held vancomycin dose on Wednesday (4/5) ?Decrease vancomycin to 500 mg qMWF post HD, starting on Friday (4/7) pending vancomycin random level ?Continue cefepime 2 g IV qMWF post HD ?Continue flagyl 500 mg PO BID ? ?Height: 6\' 1"  (185.4 cm) ?Weight: 110 kg (242 lb 8.1 oz) ?IBW/kg (Calculated) : 79.9 ? ?Temp (24hrs), Avg:98.3 ?F (36.8 ?C), Min:97.7 ?F (36.5 ?C), Max:99 ?F (37.2 ?C) ? ?Recent Labs  ?Lab 02/14/22 ?2585 02/15/22 ?2778 02/16/22 ?0238 02/17/22 ?2423 02/18/22 ?5361 02/19/22 ?4431 02/20/22 ?0038  ?WBC 10.5   < > 10.7* 11.2* 9.4 8.1 8.4  ?CREATININE 10.92*   < > 10.05* 12.30* 8.52* 10.60* 7.18*  ?VANCORANDOM 34  --   --  33  --   --   --   ? < > = values in this interval not displayed.  ? ?  ?Estimated Creatinine Clearance: 14.4 mL/min (A) (by C-G formula based on SCr of 7.18 mg/dL (H)).   ? ?Allergies  ?Allergen Reactions  ? Sulfa Antibiotics Nausea And Vomiting  ? ? ?Antimicrobials this admission: ?Cefepime 3/20 >> [5/6] ?Metronidazole 3/20 >> [5/6] ?Vancomycin 3/20 >> [5/6] ? ?Dose adjustments this admission: ?3/31-vanc decreased from 1g to 750 mg post HD ?4/3 - hold vanc on next HD day and reduce dose to 500 mg post HD ? ?Microbiology results: ?3/19 BCx: morganella morganii (resistant to amp, unasyn, and cefazolin) ?3/19 WCx morganella morganii (resistant to amp, unasyn, and cefazolin) ?3/20 MRSA PCR surgical: positive ? ? ?Alic Hilburn A. Levada Dy, PharmD, BCPS, FNKF ?Clinical Pharmacist ?[place] ?Please utilize Amion for appropriate phone number to reach the unit pharmacist ([place]) ? ? ? ? ? ? ? ?  ? ? ?

## 2022-02-20 NOTE — Plan of Care (Signed)
Pt is alert oriented x 4. Pt has received CHG bath, linens changed. Wound vac in place. Pt canister has total 39ml of red drainage. No distress noted.  ? ? ?Problem: Education: ?Goal: Knowledge of General Education information will improve ?Description: Including pain rating scale, medication(s)/side effects and non-pharmacologic comfort measures ?Outcome: Progressing ?  ?Problem: Health Behavior/Discharge Planning: ?Goal: Ability to manage health-related needs will improve ?Outcome: Progressing ?  ?Problem: Clinical Measurements: ?Goal: Ability to maintain clinical measurements within normal limits will improve ?Outcome: Progressing ?Goal: Will remain free from infection ?Outcome: Progressing ?Goal: Diagnostic test results will improve ?Outcome: Progressing ?Goal: Respiratory complications will improve ?Outcome: Progressing ?Goal: Cardiovascular complication will be avoided ?Outcome: Progressing ?  ?Problem: Activity: ?Goal: Risk for activity intolerance will decrease ?Outcome: Progressing ?  ?Problem: Nutrition: ?Goal: Adequate nutrition will be maintained ?Outcome: Progressing ?  ?Problem: Coping: ?Goal: Level of anxiety will decrease ?Outcome: Progressing ?  ?Problem: Elimination: ?Goal: Will not experience complications related to bowel motility ?Outcome: Progressing ?Goal: Will not experience complications related to urinary retention ?Outcome: Progressing ?  ?Problem: Pain Managment: ?Goal: General experience of comfort will improve ?Outcome: Progressing ?  ?Problem: Safety: ?Goal: Ability to remain free from injury will improve ?Outcome: Progressing ?  ?Problem: Skin Integrity: ?Goal: Risk for impaired skin integrity will decrease ?Outcome: Progressing ?  ?Problem: Education: ?Goal: Knowledge of disease or condition will improve ?Outcome: Progressing ?Goal: Knowledge of secondary prevention will improve (SELECT ALL) ?Outcome: Progressing ?Goal: Knowledge of patient specific risk factors will improve  (INDIVIDUALIZE FOR PATIENT) ?Outcome: Progressing ?  ?Problem: Coping: ?Goal: Will verbalize positive feelings about self ?Outcome: Progressing ?Goal: Will identify appropriate support needs ?Outcome: Progressing ?  ?Problem: Health Behavior/Discharge Planning: ?Goal: Ability to manage health-related needs will improve ?Outcome: Progressing ?  ?Problem: Health Behavior/Discharge Planning: ?Goal: Ability to manage health-related needs will improve ?Outcome: Progressing ?  ?Problem: Self-Care: ?Goal: Ability to participate in self-care as condition permits will improve ?Outcome: Progressing ?Goal: Verbalization of feelings and concerns over difficulty with self-care will improve ?Outcome: Progressing ?Goal: Ability to communicate needs accurately will improve ?Outcome: Progressing ?  ?Problem: Nutrition: ?Goal: Risk of aspiration will decrease ?Outcome: Progressing ?Goal: Dietary intake will improve ?Outcome: Progressing ?  ?Problem: Ischemic Stroke/TIA Tissue Perfusion: ?Goal: Complications of ischemic stroke/TIA will be minimized ?Outcome: Progressing ?  ?Problem: Education: ?Goal: Knowledge of disease or condition will improve ?Outcome: Progressing ?Goal: Knowledge of secondary prevention will improve (SELECT ALL) ?Outcome: Progressing ?Goal: Individualized Educational Video(s) ?Outcome: Progressing ?  ?Problem: Coping: ?Goal: Will verbalize positive feelings about self ?Outcome: Progressing ?Goal: Will identify appropriate support needs ?Outcome: Progressing ?  ?Problem: Health Behavior/Discharge Planning: ?Goal: Ability to manage health-related needs will improve ?Outcome: Progressing ?  ?Problem: Self-Care: ?Goal: Ability to participate in self-care as condition permits will improve ?Outcome: Progressing ?Goal: Verbalization of feelings and concerns over difficulty with self-care will improve ?Outcome: Progressing ?Goal: Ability to communicate needs accurately will improve ?Outcome: Progressing ?  ?Problem:  Nutrition: ?Goal: Risk of aspiration will decrease ?Outcome: Progressing ?Goal: Dietary intake will improve ?Outcome: Progressing ?  ?Problem: Education: ?Goal: Knowledge of secondary prevention will improve (SELECT ALL) ?Outcome: Progressing ?  ?

## 2022-02-20 NOTE — Progress Notes (Signed)
?[facility] ?Progress Note  ? ?Subjective: stable, seen on HD. No c/o.  ? ?Objective ?Vitals:  ? 02/19/22 2003 02/19/22 2308 02/20/22 0422 02/20/22 0726  ?BP: (!) 151/74 (!) 142/66 (!) 157/77 140/67  ?Pulse: 86 90 84 81  ?Resp:   20 17  ?Temp: 98.8 ?F (37.1 ?C) 99 ?F (37.2 ?C) 98.8 ?F (37.1 ?C) 97.7 ?F (36.5 ?C)  ?TempSrc: Oral Oral Oral Oral  ?SpO2: 98% 100% 100% 97%  ?Weight:      ?Height:      ? ?Physical Exam ?General: NAD ?Heart: S1,S2 RRR No M/R/G ?Lungs: No WOB. CTAB ?Abdomen: Obese, NT, ND ?Extremities: No LE edema. Wound VAC intact on foot.  ?Dialysis Access: L AVF +[place] ? ?OP HD:  TTS (was MWF) at [facility] ?4h 33min  450/500    117.8kg   2/ 2.5Ca bath L AVF Hep 6000  ? - on 3/19 > hep B Ag neg, and Hep B Ab's high/ protective ? - calcitriol 1.51mcg PO qHD-last 01/31/22 ?  ?Assessment/Plan: ?R foot osteomyelitis - + Morganella bacteremia 2/2 foot abscess. S/p R foot I&D on 3/19 by podiatry, and another I&D done 3/25. Will need 6 weeks ABX for attempted limb salvage (IV vanc/cefepime w/ HD and PO Flagyl through 03/22/22).  ?Acute metabolic encephalopathy- resolved ?Hypotension/ sepsis - resolved  ?CVA: Neurology consulted. MRI done 02/04/2022 Numerous bilat punctate acute infarctions likely due to hypoperfusion per neuro. Per primary. Try to avoid hypotension on HD.  ?ESRD - will now be TTS at SNF upon dc. Next HD Sat.  ?HTN/ vol - euvolemic on exam. Keep SBP up on HD as possible. 6-7kg under today, lower edw at dc.  ?Anemia of CKD - Hb 8s, ESA darbe cont qWed, ?Secondary Hyperparathyroidism -  CCa okay. Continue VDRA with HD. Phos stable ?HD access: L AVF w/ thinning, ulcerated area at the lower portion. Stick away from area. VVS consulted and went for fistulogram on 3/23 by VVS > they did venoplasty to the stenotic areas.  ? ?[name] Splinter, MD ?02/20/2022, 8:10 AM ? ? ?Recent Labs  ?Lab 02/19/22 ?1751 02/20/22 ?0038  ?HGB 8.6* 8.7*  ?ALBUMIN 2.2* 2.2*  ?CALCIUM 9.0 8.7*  ?PHOS 3.8 2.5  ?CREATININE  10.60* 7.18*  ?K 4.1 4.0  ? ? ?Medications: ? ceFEPime (MAXIPIME) IV Stopped (02/19/22 2132)  ? sodium chloride    ? ? (feeding supplement) PROSource Plus  30 mL Oral BID BM  ? vitamin C  500 mg Oral Daily  ? aspirin EC  81 mg Oral Daily  ? Chlorhexidine Gluconate Cloth  6 each Topical Q0600  ? darbepoetin (ARANESP) injection - DIALYSIS  100 mcg Intravenous Q Wed-HD  ? famotidine  20 mg Oral Daily  ? feeding supplement (NEPRO CARB STEADY)  237 mL Oral BID BM  ? heparin  5,000 Units Subcutaneous Q8H  ? insulin aspart  0-15 Units Subcutaneous TID WC  ? insulin aspart  0-5 Units Subcutaneous QHS  ? insulin glargine-yfgn  22 Units Subcutaneous Daily  ? metroNIDAZOLE  500 mg Oral Q12H  ? midodrine  10 mg Oral Q M,W,F-HD  ? multivitamin  1 tablet Oral QHS  ? pantoprazole  40 mg Oral Q1200  ? polyethylene glycol  17 g Oral BID  ? senna-docusate  1 tablet Oral BID  ? sorbitol, milk of mag, mineral oil, glycerin (SMOG) enema  960 mL Rectal Once  ? sucroferric oxyhydroxide  1,000 mg Oral TID WC  ? thiamine injection  100 mg Intravenous  Daily  ? Or  ? thiamine  100 mg Oral Daily  ? ? ? ? ? ? ? ? ?  ? ?

## 2022-02-20 NOTE — Progress Notes (Signed)
Pt refused PRN colace.  Pt stated that he "took all that medicine this morning" referring to miralax and senna-docusate this morning.  Education provided.  Pt verbalized understanding - pt still refused colace.  Will continue to monitor and provide education.   ?

## 2022-02-20 NOTE — Progress Notes (Signed)
Wound vac discontinued - dressing changed to wet to dry.  [facility] transporting pt to [facility].  Daughter Valinda Party notified of pt discharge.   ?

## 2022-02-20 NOTE — Discharge Summary (Signed)
Physician Discharge Summary  ?[name] Dove Robbins Sr. ZDG:[id number] DOB: April 21, 1961 DOA: 02/02/2022 ? ?PCP: [facility] ? ?Admit date: 02/02/2022 ?Discharge date: 02/20/2022 ? ?Admitted From: Home ?Disposition: SNF ? ?Recommendations for Outpatient Follow-up:  ?Follow up with PCP in 1-2 weeks ?Follow up with ID within 1-2 weeks ?Follow up with Nephrology and continue Dialysis TThSat ?Follow up with Podiatry within 1-2 weeks ?Please obtain BMP/CBC in one week ?Please follow up on the following pending results: ? ?Home Health: No  ?Equipment/Devices: Wound Vac   ? ?Discharge Condition: Stable  ?CODE STATUS: FULL CODE ?Diet recommendation: Dysphagia 3 Diet Renal Carb Modified Diet with 1200 mL Fluid Restriction  ? ?Brief/Interim Summary: ?The patient is a 61 y/o male history of DM, ESRD on HD, admitted to the hospital with fever, acute encephalopathy, sepsis and right foot infection.  He has a history of prior right fifth metatarsal amputation performed many years ago in [place].  Plain films of his foot indicate swelling and subcutaneous gas without any obvious bony erosion. [facility] presented to [facility] and subsequently transferred to [facility].  Podiatry consulted, he was taken emergently to [facility] on 3/19.  Postoperatively he has had persistent lethargy and was taken to the ICU 3/19 evening.  He was placed on Narcan drip for presumed lethargy due to opioid use however he only got 5 mg of oxycodone.  Narcan drip was discontinued on 3/20 and he was transferred out of the ICU. He continued to have altered mental status and was found to have findings consistent with acute strokes.  Neurology was consulted and suspects related to hypotension.  His mental status has gradually improved.  Blood cultures were positive with morganella and clostridium related to his foot infection.  ID has been consulted and recommended 6 weeks of abx after last debridement.  He went back to OR on 3/25 for I&D with  podiatry.  Hospitalization c/b malfunctioning L AV fistula, he's s/p fistulogram with vascular on 3/23.  At this time, plan is for d/c to SNF when bed available.  He had nausea/vomiting of unclear etiology 3/28 with now improving. ?  ?See below for additional details ?  ?**He continues to get dialysis and now has been accepted at [facility] on TTS schedule and we can start on Saturday; he was dialyzed yesteday. He is stable to be D/C'd to SNF and his wound VAC is being changed and will continue wound VAC changes Monday Wednesday Friday.He is stable for D/C at this time and will need to follow up with PCP within 1-2 weeks ? ?Discharge Diagnoses:  ?Principal Problem: ?  Osteomyelitis ([place]) ?Active Problems: ?  Severe sepsis ([place]) ?  Nausea & vomiting ?  Hypotension ?  Acute metabolic encephalopathy ?  Stroke [facility]) ?  ESRD (end stage renal disease) ([place]) ?  DM2 (diabetes mellitus, type 2) ([place]) ?  HTN (hypertension) ?  Chronic systolic CHF (congestive heart failure) ([place]) ?  Acute respiratory failure with hypoxia ([place]) ?  Protein-calorie malnutrition, mild ([place]) ?  GERD (gastroesophageal reflux disease) ?  Carotid stenosis ?  Hyponatremia ?  Hyperkalemia ?  High anion gap metabolic acidosis ?  Anemia of chronic renal failure ?  Necrotizing soft tissue infection ?  Cellulitis in diabetic foot ([place]) ?  Bacteremia ?  Right foot infection ?  Obesity (BMI 30-39.9) ? ?* Osteomyelitis ([place]) ?-See Below ?  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 ?Appreciate ID assistance, continuing vanc, flagyl, and cefepime - low suspicion for endocarditis per ID, TTE without eivdence of valvular vegetations on TTE (per ID, "do not feel strongly regarding getting TEE from ID standpoint given low grade bacteremia with GNR/GPR with right foot being source, would defer need of TEE to neurology if helpful for stroke workup - neurology did not recommend from their standpoint).  Recommending 6 weeks of abx after last Debridement intervention (from 3/25). Stop Date is 03/22/22 ?-Vancomycin/Cefepime during HD and p.o. Flagyl ?  ?Hypotension ?-Post op, likely multifactorial, blood loss, meds ?-He was weaned off pressors after small bolus post op 3/25 ?-Will continue to trend Hb/hct but it is stable at 8.7/28.6 ?-Blood pressure stable and improving and now starting to trend up. ?-Monitor for now. ?-Continue Midodrine 10 mg pre HD. ?  ?Nausea & Vomiting, improved  ?-Unclear etiology ?-Likely secondary to constipation/fecal impaction. ?-KUB with stool in rectosigmoid concerning for constipation/fecal impaction ?Disimpaction/enema ordered with good results. ?-Improved clinically. ?-Patient denies any further nausea or vomiting. ?-Patient diet advanced to a soft diet which he is tolerating. ?-Continue current bowel regimen of MiraLAX twice daily, Senokot-S twice daily. ?-IV antiemetics as needed.   ?-C/w Supportive care. ?  ?Stroke [facility]) ?-MRI brain 3/22 with numerous punctate acute infarctions throughout both cerebral hemispheres, primarily affecting the deep brain ?-Echo with Ef 45-50%, global hypokinesis, RVSF  mildly reduced ?-CTA neck 3/20 without LVO or hemodynamically significant stenosis of intracranial arteries, did have 70% stenosis for R ICA due to mixed density atherosclerosis ?-CT venogram normal ?-LDL 46, A1c 12.2 ?-Appreciate neurology recommendations - suspect likely 2/2 hypoperfusion with intermittent hypotension.   ?-Aspirin 81 mg daily.  No statin at this time, -LDL at goal. ?-Will need outpatient follow-up with Neurology. ?  ?Acute metabolic encephalopathy ?-Related to stroke as well as infection and acute hospitalization ?-Clinical improvement. 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 ?   ?ESRD (end stage renal disease) ([name]) ?-Per renal ?-Being evaluated outpatient for possible transplant ?0S/p L arm fistulogram with vascular 3/23 -> L central angioplasty innominate and subclavian vein, L peripheral angioplasty cephalic vein ?-Patient with hemodialysis Monday Wednesday Friday schedule currently but will be changed to TTS Schedule   ?-Patient's BUN/Cr is improved and went from 41/10.60 -> 21/7.18 ?-Per nephrology with Next HD Session Saturday  ?  ?DM2 (diabetes mellitus, type 2) ([place]) ?-Hemoglobimn a1c 12.2 (02/02/2022). ?-CBG's ranging from 97-202 ?-Increased Semglee to 22 units daily and can continue at D/C ?-C/w Moderate Novolog SSI AC/HS ?   ?HTN (hypertension) ?-Intermittent hypotension, concern that this may have resulted in watershed infarcts. ?-Blood pressure stable. ?-Hydralazine as needed. ?-Continue midodrine pre-HD. ?-  Continue to Monitor BP per Protocol; Last BP reading was 143/79 ?  ?Acute respiratory failure with hypoxia ([place]) ?-Currently has been weaned to RA with sats of 98-100%, continue to monitor ?-SpO2: 100 % ?O2 Flow Rate (L/min): 2 L/min; Now not on Supplemental O2 ?-CXR 3/20  without frank edema ?  ?Chronic systolic CHF (congestive heart failure) ([place]) ?-EF 45% per echo 05/2021 ?-Echo 3/22 with EF 45-50% ?-Volume maintenance and Fluid management per Nephrology. ?-Nephrology feels he is 6-7

## 2022-02-20 NOTE — Progress Notes (Signed)
Physical Therapy Treatment ?Patient Details ?Name: [name] GARTMAN Sr. ?MRN: [id number] ?DOB: Jan 06, 1961 ?Today's Date: 02/20/2022 ? ? ?History of Present Illness 61 y.o. male  presents 02/02/22 with altered mental status. Pt found to have osteomyelitis of R foot, sepsis and acute respiratory failure with hypoxia with acute metabolic encephalopathy. 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 Transferred to ICU after LoC and hypotension systolic in 46T placed on 5L O2 and given Narcan. 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 PMH: diabetes mellitus type 2, CHF, ESRD, hypertension, myocardial infarction, nonischemic cardiomyopathy. s/p Right irrigation and debridement with removal of residual bone at 4-5 metatarsals on 3/25. ? ?  ?PT Comments  ? ? Focus of session today was bed mobility, repeated transfers, and standing balance. The patient tolerated well and showed increased motivation during today's treatment.  Pt. Shows overall improvement with his bed mobility, transfer ability, and stand tolerance. Overall functional strength, endurance, balance, and WB restrictions are still limiting function. He is still unable to participate in gait training because he does not have enough balance or strength to maintain NWB during functional gait with RW. Pt. Would benefit from skilled PT to continue to address his functional mobility, strength, balance, transfer ability, and gait when able. Plan and discharge setting remains unchanged. Pt to follow acutely as appropriate.  ?   ?Recommendations for follow up therapy are one component of a multi-disciplinary discharge planning process, led by the attending physician.  Recommendations may be updated based on patient status, additional functional criteria and insurance authorization. ? ?Follow Up Recommendations ? Skilled nursing-short term rehab (<3 hours/day) ?  ?  ?Assistance Recommended at Discharge Intermittent Supervision/Assistance  ?Patient can return home  with the following Two people to help with walking and/or transfers;Two people to help with bathing/dressing/bathroom;Assistance with cooking/housework;Assistance with feeding;Direct supervision/assist for medications management;Direct supervision/assist for financial management;Assist for transportation;Help with stairs or ramp for entrance ?  ?Equipment Recommendations ? Rolling walker (2 wheels);BSC/3in1  ?  ?Recommendations for Other Services   ? ? ?  ?Precautions / Restrictions Precautions ?Precautions: Fall ?Precaution Comments: WBAT for transfers only R LE heel per podiatry note on 4/4 ?Required Braces or Orthoses: Other Brace ?Other Brace: Post op Shoe ?Restrictions ?Weight Bearing Restrictions: Yes ?RLE Weight Bearing: Non weight bearing ?Other Position/Activity Restrictions: WBAT transfers only  ?  ? ?Mobility ? Bed Mobility ?Overal bed mobility: Needs Assistance ?Bed Mobility: Supine to Sit ?  ?  ?Supine to sit: Min assist, HOB elevated ?  ?  ?General bed mobility comments: Min A for pull for pivot to EOB, pt required SPT support to help pull ?  ? ?Transfers ?Overall transfer level: Needs assistance ?Equipment used: Rolling walker (2 wheels) ?Transfers: Sit to/from Stand ?Sit to Stand: Min guard ?  ?Step pivot transfers: Min guard ?  ?  ?  ?General transfer comment: Increased time for trasnfers and cuing for WB and sequecning. Pt. shows improvement in technique after repeat bouts. ?  ? ?Ambulation/Gait ?  ?  ?  ?  ?  ?  ?  ?  ? ? ?Stairs ?  ?  ?  ?  ?  ? ? ?Wheelchair Mobility ?  ? ?Modified Rankin (Stroke Patients Only) ?  ? ? ?  ?Balance Overall balance assessment: Needs assistance ?Sitting-balance support: No upper extremity supported, Feet supported ?Sitting balance-Leahy Scale: Good ?Sitting balance - Comments: Maintains sitting balance EOB during seated recovery ?  ?Standing balance support: Bilateral upper extremity supported, During functional  activity, Reliant on assistive device for  balance ?Standing balance-Leahy Scale: Poor ?Standing balance comment: Heavily reliant on RW and WBAT for stand and transfer ?  ?  ?  ?  ?  ?  ?  ?  ?  ?  ?  ?  ? ?  ?Cognition Arousal/Alertness: Awake/alert ?Behavior During Therapy: Flat affect ?Overall Cognitive Status: Impaired/Different from baseline ?Area of Impairment: Safety/judgement, Awareness, Problem solving ?  ?  ?  ?  ?  ?  ?  ?  ?  ?  ?  ?  ?Safety/Judgement: Decreased awareness of safety, Decreased awareness of deficits ?Awareness: Intellectual ?Problem Solving: Slow processing, Decreased initiation ?General Comments: increased command following and time for processing ?  ?  ? ?  ?Exercises   ? ?  ?General Comments General comments (skin integrity, edema, etc.): VSS on RA, pt. shows increased initiative during session ?  ?  ? ?Pertinent Vitals/Pain    ? ? ?Home Living   ?  ?  ?  ?  ?  ?  ?  ?  ?  ?   ?  ?Prior Function    ?  ?  ?   ? ?PT Goals (current goals can now be found in the care plan section) Acute Rehab PT Goals ?PT Goal Formulation: Patient unable to participate in goal setting ?Time For Goal Achievement: 02/27/22 ?Potential to Achieve Goals: Good ?Progress towards PT goals: Progressing toward goals ? ?  ?Frequency ? ? ? Min 2X/week ? ? ? ?  ?PT Plan Current plan remains appropriate  ? ? ?Co-evaluation   ?  ?  ?  ?  ? ?  ?AM-PAC PT "6 Clicks" Mobility   ?Outcome Measure ? Help needed turning from your back to your side while in a flat bed without using bedrails?: A Little ?Help needed moving from lying on your back to sitting on the side of a flat bed without using bedrails?: A Little ?Help needed moving to and from a bed to a chair (including a wheelchair)?: A Little ?Help needed standing up from a chair using your arms (e.g., wheelchair or bedside chair)?: A Little ?Help needed to walk in hospital room?: Total ?Help needed climbing 3-5 steps with a railing? : Total ?6 Click Score: 14 ? ?  ?End of Session   ?Activity Tolerance: Patient  tolerated treatment well ?Patient left: in chair;with call bell/phone within reach;with chair alarm set ?Nurse Communication: Mobility status ?PT Visit Diagnosis: Muscle weakness (generalized) (M62.81);Difficulty in walking, not elsewhere classified (R26.2) ?  ? ? ?Time: 1201-1226 ?PT Time Calculation (min) (ACUTE ONLY): 25 min ? ?Charges:  $Therapeutic Activity: 23-37 mins          ?          ? ?Thermon Leyland, SPT ?Acute Rehab Services ? ? ? ?Thermon Leyland ?02/20/2022, 1:57 PM ? ?

## 2022-02-20 NOTE — TOC Transition Note (Signed)
Transition of Care (TOC) - CM/SW Discharge Note ? ? ?Patient Details  ?Name: [name] KLOOS Sr. ?MRN: [id number] ?Date of Birth: 1961-08-04 ? ?Transition of Care [facility]) CM/SW Contact:  ?Geralynn Ochs, LCSW ?Phone Number: ?02/20/2022, 1:42 PM ? ? ?Clinical Narrative:   CSW confirmed with renal navigator that patient will start at new HD center on Saturday, can DC to SNF today. CSW updated team, coordinated discharge to SNF. CSW completed [place] application and sent in to the city, confirmed that [facility] will coordinate scheduling transportation for patient. CSW updated daughter with discharge information and answered questions. Transportation scheduled with [facility] for next available. ? ?Nurse to call report to [phone], Room 101 ? ? ? ? ?Final next level of care: [place] ?Barriers to Discharge: Barriers Resolved ? ? ?Patient Goals and CMS Choice ?  ?CMS Medicare.gov Compare Post Acute Care list provided to:: Patient Represenative (must comment) ?Choice offered to / list presented to : Adult Children ? ?Discharge Placement ?  ?           ?Patient chooses bed at: [facility] ?Patient to be transferred to facility by: [facility] ?Name of family member notified: Cierra ?Patient and family notified of of transfer: 02/20/22 ? ?Discharge Plan and Services ?  ?  ?           ?  ?  ?  ?  ?  ?  ?  ?  ?  ?  ? ?Social Determinants of Health (SDOH) Interventions ?  ? ? ?Readmission Risk Interventions ?   ? View : No data to display.  ?  ?  ?  ? ? ? ? ? ?

## 2022-02-25 ENCOUNTER — Telehealth: Payer: Self-pay

## 2022-02-25 ENCOUNTER — Inpatient Hospital Stay: Payer: Medicare Other | Admitting: Infectious Diseases

## 2022-02-25 NOTE — Telephone Encounter (Signed)
Called patient to see if he would be able to make it to today's appointment, no answer and unable to leave message.  Beryle Flock, RN

## 2022-02-27 ENCOUNTER — Telehealth: Payer: Self-pay | Admitting: *Deleted

## 2022-02-27 NOTE — Telephone Encounter (Signed)
Wound care nurse is calling for a plan of care to start using adaptive to wound in between the foam and wound bed, having a difficult time with foam dressing sticking too much to the wound bed. ? She is also wanting to know if there are any f/u recommendations, would you like to schedule appointment? Please advise. ?

## 2022-02-27 NOTE — Telephone Encounter (Signed)
Called, left message giving instructions to nurse at [facility], that she may use the adaptive over the wound , then foam over the adaptive.

## 2022-03-06 ENCOUNTER — Ambulatory Visit: Payer: Medicare Other | Admitting: Sports Medicine

## 2022-03-13 ENCOUNTER — Ambulatory Visit: Payer: Medicare Other | Admitting: Sports Medicine

## 2022-04-07 ENCOUNTER — Ambulatory Visit (INDEPENDENT_AMBULATORY_CARE_PROVIDER_SITE_OTHER): Payer: Medicare Other | Admitting: Infectious Diseases

## 2022-04-07 ENCOUNTER — Encounter: Payer: Self-pay | Admitting: Infectious Diseases

## 2022-04-07 ENCOUNTER — Other Ambulatory Visit: Payer: Self-pay

## 2022-04-07 VITALS — BP 152/89 | HR 92 | Temp 97.9°F

## 2022-04-07 DIAGNOSIS — L97519 Non-pressure chronic ulcer of other part of right foot with unspecified severity: Secondary | ICD-10-CM | POA: Diagnosis not present

## 2022-04-07 DIAGNOSIS — M869 Osteomyelitis, unspecified: Secondary | ICD-10-CM | POA: Diagnosis not present

## 2022-04-07 DIAGNOSIS — Z5181 Encounter for therapeutic drug level monitoring: Secondary | ICD-10-CM | POA: Diagnosis not present

## 2022-04-07 DIAGNOSIS — E11621 Type 2 diabetes mellitus with foot ulcer: Secondary | ICD-10-CM

## 2022-04-07 NOTE — Progress Notes (Incomplete)
Patient Active Problem List   Diagnosis Date Noted   Obesity (BMI 30-39.9) 02/19/2022   Hypotension 02/08/2022   Right foot infection    Stroke ([place]) 02/05/2022   Carotid stenosis 02/05/2022   Bacteremia    Hyponatremia 02/03/2022   Hyperkalemia 02/03/2022   High anion gap metabolic acidosis 56/31/4970   Anemia of chronic renal failure 02/03/2022   Necrotizing soft tissue infection    Cellulitis in diabetic foot ([place])    Osteomyelitis ([place]) 26/37/8588   Acute metabolic encephalopathy 50/27/7412   Severe sepsis ([place]) 02/02/2022   Acute respiratory failure with hypoxia ([place]) 02/02/2022   Protein-calorie malnutrition, mild ([place]) 02/02/2022   GERD (gastroesophageal reflux disease) 87/86/7672   Chronic systolic CHF (congestive heart failure) ([place]) 08/14/2019   LBBB (left bundle branch block) 08/13/2019   Problem with vascular access 08/13/2019   Dialysis AV fistula malfunction ([place]) 08/12/2019   ESRD (end stage renal disease) ([place]) 09/20/2018   Fever 09/20/2018   Nausea & vomiting 09/20/2018   DM2 (diabetes mellitus, type 2) ([place]) 09/20/2018   HTN (hypertension) 09/20/2018   Current Outpatient Medications on File Prior to Visit  Medication Sig Dispense Refill   acetaminophen (TYLENOL) 325 MG tablet Take 2 tablets (650 mg total) by mouth every 6 (six) hours as needed for mild pain, fever or headache.     albuterol (PROVENTIL) (2.5 MG/3ML) 0.083% nebulizer solution Take 3 mLs (2.5 mg total) by nebulization every 2 (two) hours as needed for wheezing. 75 mL 12   ascorbic acid (VITAMIN C) 500 MG tablet Take 1 tablet (500 mg total) by mouth daily.     aspirin EC 81 MG EC tablet Take 1 tablet (81 mg total) by mouth daily. Swallow whole. 30 tablet 11   ceFEPIme 2 g in sodium chloride 0.9 % 100 mL Inject 2 g into the vein every Monday, Wednesday, and Friday at 8 PM.     lidocaine-prilocaine (EMLA) cream Apply 1 application topically every Monday, Wednesday, and Friday with  hemodialysis.   3   metroNIDAZOLE (FLAGYL) 500 MG tablet Take 1 tablet (500 mg total) by mouth every 12 (twelve) hours.     midodrine (PROAMATINE) 10 MG tablet Take 1 tablet (10 mg total) by mouth every Monday, Wednesday, and Friday with hemodialysis.     multivitamin (RENA-VIT) TABS tablet Take 1 tablet by mouth at bedtime.  0   Nutritional Supplements (,FEEDING SUPPLEMENT, PROSOURCE PLUS) liquid Take 30 mLs by mouth 2 (two) times daily between meals.     Nutritional Supplements (FEEDING SUPPLEMENT, NEPRO CARB STEADY,) LIQD Take 237 mLs by mouth 2 (two) times daily between meals.  0   ondansetron (ZOFRAN) 4 MG tablet Take 1 tablet (4 mg total) by mouth every 6 (six) hours as needed for nausea. 20 tablet 0   pantoprazole (PROTONIX) 40 MG tablet Take 1 tablet (40 mg total) by mouth daily at 12 noon.     polyethylene glycol (MIRALAX / GLYCOLAX) 17 g packet Take 17 g by mouth 2 (two) times daily. 14 each 0   senna-docusate (SENOKOT-S) 8.6-50 MG tablet Take 1 tablet by mouth 2 (two) times daily.     sucroferric oxyhydroxide (VELPHORO) 500 MG chewable tablet Chew 1,000 mg by mouth 3 (three) times daily with meals.     thiamine (B-1) 100 MG/ML injection Inject 1 mL (100 mg total) into the vein daily. 25 mL    TRESIBA FLEXTOUCH 100 UNIT/ML FlexTouch Pen Inject 30 Units into the skin daily.  No current facility-administered medications on file prior to visit.   Subjective: Here for HFU for Rt lateral foot osteomyelitis. After last seen on 3/26, patient was discharged on 4/6 to SNF. He also had malfunctioning LAVF for which he underwent Fistulogram on 3/23. He missed his appt with me on 4/11. He has completed 6 weeks of Vancomycin/cefepime and metronidazole already and is off antibiotics. It seems he missed his office appt with Podiatry, still has wound vac in place. Tells me wound vac gets changes almost every other day. Denies any drainage. Rt lateral heel with pink granulation tissue ( seen in his  phone). Denies fevers, chills, vomiting, abdominal pain and diarrhea. He has a Fu with Dr patel Victory Dakin 5/26.   Review of Systems: ROS all systems reviewed with pertinent positives and negatives as listed above  Past Medical History:  Diagnosis Date   CHF (congestive heart failure) (HCC)    Diabetes mellitus without complication ([place])    type 2   ESRD on hemodialysis ([place])    mon wed fri   Hypertension    Kidney failure    Myocardial infarction ([place])    Nonischemic cardiomyopathy ([place])    Past Surgical History:  Procedure Laterality Date   A/V FISTULAGRAM N/A 08/15/2019   Procedure: A/V FISTULAGRAM;  Surgeon: [name], MD;  Location: [facility];  Service: Cardiovascular;  Laterality: N/A;   AMPUTATION Right 02/02/2022   Procedure: AMPUTATION 5th RAY;  Surgeon: [name], MD;  Location: [facility];  Service: Podiatry;  Laterality: Right;   AV FISTULA PLACEMENT     CARDIAC CATHETERIZATION  02/17/2018   LHC 02/17/18 ([name]): 30% pLAD, 30% dRCA, LVEDP 25-30, LVEF 25%.     CATARACT EXTRACTION W/PHACO Right 03/29/2020   Procedure: CATARACT EXTRACTION PHACO AND INTRAOCULAR LENS PLACEMENT ([place])  RIGHT DIABETIC;  Surgeon: [name], MD;  Location: [facility];  Service: Ophthalmology;  Laterality: Right;  Lot # [id number] H [place]: 02:30.9 AP% 9.5% CDE: 14.38   EYE SURGERY Right    cataract removed   I & D EXTREMITY Right 02/02/2022   Procedure: IRRIGATION AND DEBRIDEMENT RIGHT FOOT;  Surgeon: [name], MD;  Location: [facility];  Service: Podiatry;  Laterality: Right;   INCISION AND DRAINAGE OF WOUND Right 02/08/2022   Procedure: IRRIGATION AND DEBRIDEMENT WOUND;  Surgeon: [name], DPM;  Location: [facility];  Service: Podiatry;  Laterality: Right;  with removal of infected bone    INSERTION OF DIALYSIS CATHETER  08/13/2019   Procedure: Insertion Of Dialysis Catheter;  Surgeon: [name], MD;  Location: [facility];  Service: Vascular;;   INSERTION OF  DIALYSIS CATHETER Left 07/19/2020   Procedure: INSERTION OF DIALYSIS CATHETER;  Surgeon: [name], MD;  Location: [place];  Service: Vascular;  Laterality: Left;   IRRIGATION AND DEBRIDEMENT ABSCESS     PERIPHERAL VASCULAR BALLOON ANGIOPLASTY  08/15/2019   Procedure: PERIPHERAL VASCULAR BALLOON ANGIOPLASTY;  Surgeon: [name], MD;  Location: [facility];  Service: Cardiovascular;;   PERIPHERAL VASCULAR BALLOON ANGIOPLASTY Left 02/06/2022   Procedure: PERIPHERAL VASCULAR BALLOON ANGIOPLASTY;  Surgeon: [name], MD;  Location: [facility];  Service: Cardiovascular;  Laterality: Left;   REVISON OF ARTERIOVENOUS FISTULA Left 07/19/2020   Procedure: REVISON/PLICATION OF ARTERIOVENOUS FISTULA LEFT;  Surgeon: [name], MD;  Location: [facility];  Service: Vascular;  Laterality: Left;   THROMBECTOMY AND REVISION OF ARTERIOVENTOUS (AV) GORETEX  GRAFT Left 08/13/2019   Procedure: THROMBECTOMY OF LEFT  ARM ARTERIOVENTOUS (AV) FISTULA;  Surgeon: [name], MD;  [facility];  Service: Vascular;  Laterality: Left;   TOE AMPUTATION       Social History   Tobacco Use   Smoking status: Never   Smokeless tobacco: Never  Vaping Use   Vaping Use: Never used  Substance Use Topics   Alcohol use: Not Currently   Drug use: Never    Family History  Problem Relation Age of Onset   Hypertension Mother     Allergies  Allergen Reactions   Sulfa Antibiotics Nausea And Vomiting    Health Maintenance  Topic Date Due   FOOT EXAM  Never done   OPHTHALMOLOGY EXAM  Never done   URINE MICROALBUMIN  Never done   Hepatitis C Screening  Never done   Zoster Vaccines- Shingrix (1 of 2) Never done   COLONOSCOPY (Pts 45-37yrs Insurance coverage will need to be confirmed)  Never done   TETANUS/TDAP  09/26/2019   COVID-19 Vaccine (4 - Booster for Moderna series) 10/26/2020   INFLUENZA VACCINE  06/17/2022   HEMOGLOBIN A1C  08/05/2022   HIV  Screening  Completed   HPV VACCINES  Aged Out    Objective: BP (!) 152/89   Pulse 92   Temp 97.9 F (36.6 C) (Oral)    Physical Exam Constitutional:      Appearance: Normal appearance.  HENT:     Head: Normocephalic and atraumatic.      Mouth: Mucous membranes are moist.  Eyes:    Conjunctiva/sclera: Conjunctivae normal.     Pupils:   Cardiovascular:     Rate and Rhythm: Normal rate and regular rhythm.     Heart sounds:  Pulmonary:     Effort: Pulmonary effort is normal.     Breath sounds: Normal breath sounds.   Abdominal:     General: Non distended     Palpations: soft.   Musculoskeletal:        General: Normal range of motion.   Rt Foot has a wound vac in place  Skin:    General: Skin is warm and dry.     Comments:  Neurological:     General: grossly non focal     Mental Status: awake, alert and oriented to person, place, and time.   Psychiatric:        Mood and Affect: Mood normal.   Lab Results Lab Results  Component Value Date   WBC 8.4 02/20/2022   HGB 8.7 (L) 02/20/2022   HCT 28.6 (L) 02/20/2022   MCV 97.9 02/20/2022   PLT 428 (H) 02/20/2022    Lab Results  Component Value Date   CREATININE 7.18 (H) 02/20/2022   BUN 21 (H) 02/20/2022   NA 133 (L) 02/20/2022   K 4.0 02/20/2022   CL 96 (L) 02/20/2022   CO2 27 02/20/2022    Lab Results  Component Value Date   ALT 12 02/20/2022   AST 18 02/20/2022   ALKPHOS 52 02/20/2022   BILITOT 0.3 02/20/2022    Lab Results  Component Value Date   CHOL 119 02/05/2022   HDL 11 (L) 02/05/2022   LDLCALC 46 02/05/2022   TRIG 312 (H) 02/05/2022   CHOLHDL 10.8 02/05/2022   No results found for: LABRPR, RPRTITER No results found for: HIV1RNAQUANT, HIV1RNAVL, CD4TABS   Problem List Items Addressed This Visit       Endocrine   Diabetic ulcer of right foot associated with type 2 diabetes mellitus ([place])  Musculoskeletal and Integument   Osteomyelitis ([place]) - Primary     Other   Medication  monitoring encounter   # RT DFU/osteomyelitis  # Low grade Morganella?clostridium  bacteremia 2/2 above - 3/19 Right foot incision and drainage and revision of fifth ray amputation. OR cx with E faecalis, Morganella morganii, bacteroides thetiataomicron - Completed 6 weeks of Vancomycin, cefepime with HD and PO metronidazole early May 2023 - Fu with Podiatry for wound vac care and needs - Will request labs from SNF to review although already abtx stopped  - Fu as needed  #  ESRD on HD via left arm AVF  #  DM  I have personally spent 45 minutes involved in face-to-face and non-face-to-face activities for this patient on the day of the visit. Professional time spent includes the following activities: Preparing to see the patient (review of tests), Obtaining and/or reviewing separately obtained history (admission/discharge record), Performing a medically appropriate examination and/or evaluation , Ordering medications/tests/procedures, referring and communicating with other health care professionals, Documenting clinical information in the EMR, Independently interpreting results (not separately reported), Communicating results to the patient/family/caregiver, Counseling and educating the patient/family/caregiver and Care coordination (not separately reported).   Wilber Oliphant, [facility] 04/07/2022, 8:48 AM

## 2022-04-10 ENCOUNTER — Telehealth: Payer: Self-pay | Admitting: Podiatry

## 2022-04-10 DIAGNOSIS — E11621 Type 2 diabetes mellitus with foot ulcer: Secondary | ICD-10-CM | POA: Insufficient documentation

## 2022-04-10 DIAGNOSIS — Z7189 Other specified counseling: Secondary | ICD-10-CM | POA: Insufficient documentation

## 2022-04-10 DIAGNOSIS — Z5181 Encounter for therapeutic drug level monitoring: Secondary | ICD-10-CM | POA: Insufficient documentation

## 2022-04-10 NOTE — Telephone Encounter (Signed)
Pt has an appt tomorrow, 5/26 w/ Dr. Posey Pronto; his wound care nurse called wanting to know if wound VAC needs to be removed before appt or will it be removed here at his visit. VAC is on the foot that is being looked at. Please advise.

## 2022-04-11 ENCOUNTER — Ambulatory Visit (INDEPENDENT_AMBULATORY_CARE_PROVIDER_SITE_OTHER): Payer: Medicare Other | Admitting: Podiatry

## 2022-04-11 DIAGNOSIS — E1122 Type 2 diabetes mellitus with diabetic chronic kidney disease: Secondary | ICD-10-CM

## 2022-04-11 DIAGNOSIS — Z992 Dependence on renal dialysis: Secondary | ICD-10-CM

## 2022-04-11 DIAGNOSIS — L97512 Non-pressure chronic ulcer of other part of right foot with fat layer exposed: Secondary | ICD-10-CM

## 2022-04-11 DIAGNOSIS — N186 End stage renal disease: Secondary | ICD-10-CM

## 2022-04-11 DIAGNOSIS — Z794 Long term (current) use of insulin: Secondary | ICD-10-CM

## 2022-04-16 NOTE — Progress Notes (Signed)
Subjective:  Patient ID: [name] [name]., male    DOB: Mar 18, 1961,  MRN: [id number]  Chief Complaint  Patient presents with   Wound Check    Wound follow up     DOS: 02/08/2022 Procedure: Right lateral foot irrigation/incision washout debridement with VAC application  61 y.o. male returns for post-op check.  Patient states that he is doing okay.  He is being managed at nursing facility for wound VAC.  He states he is try not to put any weight on the foot.  The [facility] has been helping he denies any other acute complaints he is being followed up at the infectious disease.  He has stopped antibiotics and has completed 6 weeks course per the treatment guidelines.  Review of Systems: Negative except as noted in the HPI. Denies N/V/F/Ch.  Past Medical History:  Diagnosis Date   CHF (congestive heart failure) (HCC)    Diabetes mellitus without complication ([place])    type 2   ESRD on hemodialysis ([place])    mon wed fri   Hypertension    Kidney failure    Myocardial infarction ([place])    Nonischemic cardiomyopathy (HCC)     Current Outpatient Medications:    acetaminophen (TYLENOL) 325 MG tablet, Take 2 tablets (650 mg total) by mouth every 6 (six) hours as needed for mild pain, fever or headache., Disp: , Rfl:    albuterol (PROVENTIL) (2.5 MG/3ML) 0.083% nebulizer solution, Take 3 mLs (2.5 mg total) by nebulization every 2 (two) hours as needed for wheezing., Disp: 75 mL, Rfl: 12   ascorbic acid (VITAMIN C) 500 MG tablet, Take 1 tablet (500 mg total) by mouth daily., Disp: , Rfl:    aspirin EC 81 MG EC tablet, Take 1 tablet (81 mg total) by mouth daily. Swallow whole., Disp: 30 tablet, Rfl: 11   ceFEPIme 2 g in sodium chloride 0.9 % 100 mL, Inject 2 g into the vein every Monday, Wednesday, and Friday at 8 PM., Disp: , Rfl:    lidocaine-prilocaine (EMLA) cream, Apply 1 application topically every Monday, Wednesday, and Friday with hemodialysis. , Disp: , Rfl: 3   metroNIDAZOLE (FLAGYL) 500 MG  tablet, Take 1 tablet (500 mg total) by mouth every 12 (twelve) hours., Disp: , Rfl:    midodrine (PROAMATINE) 10 MG tablet, Take 1 tablet (10 mg total) by mouth every Monday, Wednesday, and Friday with hemodialysis., Disp: , Rfl:    multivitamin (RENA-VIT) TABS tablet, Take 1 tablet by mouth at bedtime., Disp: , Rfl: 0   Nutritional Supplements (,FEEDING SUPPLEMENT, PROSOURCE PLUS) liquid, Take 30 mLs by mouth 2 (two) times daily between meals., Disp: , Rfl:    Nutritional Supplements (FEEDING SUPPLEMENT, NEPRO CARB STEADY,) LIQD, Take 237 mLs by mouth 2 (two) times daily between meals., Disp: , Rfl: 0   ondansetron (ZOFRAN) 4 MG tablet, Take 1 tablet (4 mg total) by mouth every 6 (six) hours as needed for nausea., Disp: 20 tablet, Rfl: 0   pantoprazole (PROTONIX) 40 MG tablet, Take 1 tablet (40 mg total) by mouth daily at 12 noon., Disp: , Rfl:    polyethylene glycol (MIRALAX / GLYCOLAX) 17 g packet, Take 17 g by mouth 2 (two) times daily., Disp: 14 each, Rfl: 0   senna-docusate (SENOKOT-S) 8.6-50 MG tablet, Take 1 tablet by mouth 2 (two) times daily., Disp: , Rfl:    sucroferric oxyhydroxide (VELPHORO) 500 MG chewable tablet, Chew 1,000 mg by mouth 3 (three) times daily with meals., Disp: , Rfl:  thiamine (B-1) 100 MG/ML injection, Inject 1 mL (100 mg total) into the vein daily., Disp: 25 mL, Rfl:    TRESIBA FLEXTOUCH 100 UNIT/ML FlexTouch Pen, Inject 30 Units into the skin daily., Disp: , Rfl:   Social History   Tobacco Use  Smoking Status Never  Smokeless Tobacco Never    Allergies  Allergen Reactions   Sulfa Antibiotics Nausea And Vomiting   Objective:  There were no vitals filed for this visit. There is no height or weight on file to calculate BMI. Constitutional Well developed. Well nourished.  Vascular Foot warm and well perfused. Capillary refill normal to all digits.   Neurologic Normal speech. Oriented to person, place, and time. Epicritic sensation to light touch  grossly present bilaterally.  Dermatologic Skin healing well without signs of infection. Skin edges well coapted without signs of infection.  Orthopedic: Tenderness to palpation noted about the surgical site.   Radiographs: None Assessment:   1. Right foot ulcer, with fat layer exposed ([place])   2. Type 2 diabetes mellitus with chronic kidney disease on chronic dialysis, with long-term current use of insulin ([place])    Plan:  Patient was evaluated and treated and all questions answered.  S/p foot surgery right -Progressing as expected post-operatively. -XR: None -WB Status: Nonweightbearing in right lower extremity -Wound VAC is intact.  Good granular wound bed noted.  No signs of infection noted.  There are areas of exposed bone noted. -Medications: None -Betadine wet-to-dry dressing was applied and he will need continued wound VAC changes 3 times a week. -I will continue to follow-up with him to assess the closure of the wound.  No follow-ups on file.

## 2022-04-24 ENCOUNTER — Telehealth: Payer: Self-pay | Admitting: *Deleted

## 2022-04-24 NOTE — Telephone Encounter (Signed)
Wound care nurse is calling because she did a home visit today and she is requesting an order to change treatment plan. She is wanting to discontinue the wound vac and start collagen topical treatment. There is no longer any bone exposure and he is not showing any further progress with the vac. Please advise.

## 2022-04-24 NOTE — Telephone Encounter (Signed)
Returned call back to nurse,no answer, left voice message of approval for requested verbal orders for patient.

## 2022-05-02 ENCOUNTER — Ambulatory Visit (INDEPENDENT_AMBULATORY_CARE_PROVIDER_SITE_OTHER): Payer: Medicare Other | Admitting: Podiatry

## 2022-05-02 DIAGNOSIS — Z794 Long term (current) use of insulin: Secondary | ICD-10-CM

## 2022-05-02 DIAGNOSIS — E1122 Type 2 diabetes mellitus with diabetic chronic kidney disease: Secondary | ICD-10-CM

## 2022-05-02 DIAGNOSIS — N186 End stage renal disease: Secondary | ICD-10-CM | POA: Diagnosis not present

## 2022-05-02 DIAGNOSIS — Z992 Dependence on renal dialysis: Secondary | ICD-10-CM | POA: Diagnosis not present

## 2022-05-02 DIAGNOSIS — L97512 Non-pressure chronic ulcer of other part of right foot with fat layer exposed: Secondary | ICD-10-CM

## 2022-05-06 NOTE — Progress Notes (Signed)
Subjective:  Patient ID: [name] [name]., male    DOB: 22-Jul-1961,  MRN: [id number]  Chief Complaint  Patient presents with   Wound Check    3 WKS FOR WOUND    DOS: 02/08/2022 Procedure: Right lateral foot irrigation/incision washout debridement with VAC application  61 y.o. male returns for post-op check.  Patient states that the [facility] was not working at the nursing facility to discontinue the [facility].  At this time he would like to know his options for the right foot.  He is really considering doing below the knee amputation as he has been dealing with this wound for a very long time even prior to seeing me.  Review of Systems: Negative except as noted in the HPI. Denies N/V/F/Ch.  Past Medical History:  Diagnosis Date   CHF (congestive heart failure) (HCC)    Diabetes mellitus without complication ([place])    type 2   ESRD on hemodialysis ([place])    mon wed fri   Hypertension    Kidney failure    Myocardial infarction ([place])    Nonischemic cardiomyopathy (HCC)     Current Outpatient Medications:    acetaminophen (TYLENOL) 325 MG tablet, Take 2 tablets (650 mg total) by mouth every 6 (six) hours as needed for mild pain, fever or headache., Disp: , Rfl:    albuterol (PROVENTIL) (2.5 MG/3ML) 0.083% nebulizer solution, Take 3 mLs (2.5 mg total) by nebulization every 2 (two) hours as needed for wheezing., Disp: 75 mL, Rfl: 12   ascorbic acid (VITAMIN C) 500 MG tablet, Take 1 tablet (500 mg total) by mouth daily., Disp: , Rfl:    aspirin EC 81 MG EC tablet, Take 1 tablet (81 mg total) by mouth daily. Swallow whole., Disp: 30 tablet, Rfl: 11   ceFEPIme 2 g in sodium chloride 0.9 % 100 mL, Inject 2 g into the vein every Monday, Wednesday, and Friday at 8 PM., Disp: , Rfl:    lidocaine-prilocaine (EMLA) cream, Apply 1 application topically every Monday, Wednesday, and Friday with hemodialysis. , Disp: , Rfl: 3   metroNIDAZOLE (FLAGYL) 500 MG tablet, Take 1 tablet (500 mg total) by mouth every 12  (twelve) hours., Disp: , Rfl:    midodrine (PROAMATINE) 10 MG tablet, Take 1 tablet (10 mg total) by mouth every Monday, Wednesday, and Friday with hemodialysis., Disp: , Rfl:    multivitamin (RENA-VIT) TABS tablet, Take 1 tablet by mouth at bedtime., Disp: , Rfl: 0   Nutritional Supplements (,FEEDING SUPPLEMENT, PROSOURCE PLUS) liquid, Take 30 mLs by mouth 2 (two) times daily between meals., Disp: , Rfl:    Nutritional Supplements (FEEDING SUPPLEMENT, NEPRO CARB STEADY,) LIQD, Take 237 mLs by mouth 2 (two) times daily between meals., Disp: , Rfl: 0   ondansetron (ZOFRAN) 4 MG tablet, Take 1 tablet (4 mg total) by mouth every 6 (six) hours as needed for nausea., Disp: 20 tablet, Rfl: 0   pantoprazole (PROTONIX) 40 MG tablet, Take 1 tablet (40 mg total) by mouth daily at 12 noon., Disp: , Rfl:    polyethylene glycol (MIRALAX / GLYCOLAX) 17 g packet, Take 17 g by mouth 2 (two) times daily., Disp: 14 each, Rfl: 0   senna-docusate (SENOKOT-S) 8.6-50 MG tablet, Take 1 tablet by mouth 2 (two) times daily., Disp: , Rfl:    sucroferric oxyhydroxide (VELPHORO) 500 MG chewable tablet, Chew 1,000 mg by mouth 3 (three) times daily with meals., Disp: , Rfl:    thiamine (B-1) 100 MG/ML injection, Inject 1  mL (100 mg total) into the vein daily., Disp: 25 mL, Rfl:    TRESIBA FLEXTOUCH 100 UNIT/ML FlexTouch Pen, Inject 30 Units into the skin daily., Disp: , Rfl:   Social History   Tobacco Use  Smoking Status Never  Smokeless Tobacco Never    Allergies  Allergen Reactions   Sulfa Antibiotics Nausea And Vomiting   Objective:  There were no vitals filed for this visit. There is no height or weight on file to calculate BMI. Constitutional Well developed. Well nourished.  Vascular Foot warm and well perfused. Capillary refill normal to all digits.   Neurologic Normal speech. Oriented to person, place, and time. Epicritic sensation to light touch grossly present bilaterally.  Dermatologic Right lateral  foot wound with granular appearance.  No signs of bone exposure.  Orthopedic: Tenderness to palpation noted about the surgical site.   Radiographs: None Assessment:   1. Right foot ulcer, with fat layer exposed ([place])   2. Type 2 diabetes mellitus with chronic kidney disease on chronic dialysis, with long-term current use of insulin ([place])    Plan:  Patient was evaluated and treated and all questions answered.  S/p foot surgery right -Progressing as expected post-operatively. -XR: None -WB Status: Nonweightbearing in right lower extremity -The wound VAC was discontinued by nursing facility.  Good granular wound bed noted no clinical signs of infection noted.  No exposure of bone noted. -I discussed with the patient given how chronic this foot wound is and the patient himself would like to discuss with someone for below the knee amputation as he has been dealing with this for a long period of time and does not want to deal with the leg any further. -I will refer him to orthopedics for major amputation options.  He agrees with the plan  No follow-ups on file.

## 2022-05-12 ENCOUNTER — Ambulatory Visit (INDEPENDENT_AMBULATORY_CARE_PROVIDER_SITE_OTHER): Payer: Medicare Other | Admitting: Orthopedic Surgery

## 2022-05-12 ENCOUNTER — Encounter: Payer: Self-pay | Admitting: Orthopedic Surgery

## 2022-05-12 DIAGNOSIS — M86271 Subacute osteomyelitis, right ankle and foot: Secondary | ICD-10-CM

## 2022-05-12 NOTE — Progress Notes (Signed)
Office Visit Note   Patient: [name] WIRTANEN Sr.           Date of Birth: 11-28-1960           MRN: [id number] Visit Date: 05/12/2022              Requested by: Felipa Furnace, [place],  [place] [id number] PCP: [facility]  Chief Complaint  Patient presents with   Right Foot - Pain    Previous surgery with [facility] foot and ankle 02/02/2022 and 02/08/2022 I&D right foot and removal of remaining bone 4th 5th MT      HPI: Patient is a 61 year old gentleman end-stage renal disease on dialysis Tuesday Thursday and Saturday with diabetes.  Patient has undergone multiple foot salvage intervention options with podiatry.  Despite foot salvage interventions patient has progressive wound dehiscence lateral right foot and radiographic evidence of osteomyelitis.  Patient states he is also had a stroke after the foot salvage surgery interventions involving the left side.  Assessment & Plan: Visit Diagnoses:  1. Subacute osteomyelitis, right ankle and foot ([place])     Plan: Discussed with the patient recommendation to proceed with a transtibial amputation.  Patient states he would like to proceed as soon as possible.  Anticipate he would be a good candidate for inpatient rehab at [facility].  Risks and benefits and postoperative course was discussed patient states he understands has no further questions.  Follow-Up Instructions: Return in about 2 weeks (around 05/26/2022).   Ortho Exam  Patient is alert, oriented, no adenopathy, well-dressed, normal affect, normal respiratory effort. On examination patient has a strong dopplerable dorsalis pedis pulse which is biphasic.  His most recent ankle-brachial indices 3 months ago showed triphasic flow with calcified arteries and no arterial insufficiency.  Examination patient has a large wound of the lateral aspect of the right foot with exudative drainage.  Radiographs show destructive changes of the fourth metatarsal.   Complete resection of the fifth metatarsal.  Patient has a 6 a hindfoot varus and supinated forefoot due to the loss of the peroneal tendon function.  Most recent hemoglobin A1c 12.2.  Imaging: No results found. No images are attached to the encounter.  Labs: Lab Results  Component Value Date   HGBA1C 12.2 (H) 02/02/2022   HGBA1C 11.9 (H) 02/02/2022   HGBA1C 9.9 (H) 08/12/2019   ESRSEDRATE 85 (H) 02/02/2022   CRP 33.6 (H) 02/02/2022   CRP 29.8 (H) 02/02/2022   REPTSTATUS 02/12/2022 FINAL 02/07/2022   GRAMSTAIN  02/02/2022    RARE WBC PRESENT,BOTH PMN AND MONONUCLEAR ABUNDANT GRAM VARIABLE ROD ABUNDANT GRAM POSITIVE COCCI    CULT  02/07/2022    NO GROWTH 5 DAYS Performed at [facility], [place] [address], [place], [place] [address]    LABORGA [facility] MORGANII 02/02/2022   LABORGA ENTEROCOCCUS FAECALIS 02/02/2022     Lab Results  Component Value Date   ALBUMIN 2.2 (L) 02/20/2022   ALBUMIN 2.2 (L) 02/19/2022   ALBUMIN 2.3 (L) 02/18/2022   PREALBUMIN 9.5 (L) 02/02/2022   PREALBUMIN 10.9 (L) 02/02/2022    Lab Results  Component Value Date   MG 1.9 02/20/2022   MG 1.9 02/11/2022   MG 2.1 02/10/2022   No results found for: "VD25OH"  Lab Results  Component Value Date   PREALBUMIN 9.5 (L) 02/02/2022   PREALBUMIN 10.9 (L) 02/02/2022      Latest Ref Rng & Units 02/20/2022  12:38 AM 02/19/2022    4:17 AM 02/18/2022    4:36 AM  CBC EXTENDED  WBC 4.0 - 10.5 K/uL 8.4  8.1  9.4   RBC 4.22 - 5.81 MIL/uL 2.92  2.81  2.92   Hemoglobin 13.0 - 17.0 g/dL 8.7  8.6  8.9   HCT 39.0 - 52.0 % 28.6  26.9  28.6   Platelets 150 - 400 K/uL 428  439  466   NEUT# 1.7 - 7.7 K/uL 4.9  4.4  5.8   Lymph# 0.7 - 4.0 K/uL 1.5  1.6  1.4      There is no height or weight on file to calculate BMI.  Orders:  No orders of the defined types were placed in this encounter.  No orders of the defined types were placed in this encounter.    Procedures: No procedures performed  Clinical  Data: No additional findings.  ROS:  All other systems negative, except as noted in the HPI. Review of Systems  Objective: Vital Signs: There were no vitals taken for this visit.  Specialty Comments:  No specialty comments available.  PMFS History: Patient Active Problem List   Diagnosis Date Noted   Diabetic ulcer of right foot associated with type 2 diabetes mellitus ([place]) 04/10/2022   Medication monitoring encounter 04/10/2022   Obesity (BMI 30-39.9) 02/19/2022   Hypotension 02/08/2022   Right foot infection    Stroke ([place]) 02/05/2022   Carotid stenosis 02/05/2022   Bacteremia    Hyponatremia 02/03/2022   Hyperkalemia 02/03/2022   High anion gap metabolic acidosis 53/66/4403   Anemia of chronic renal failure 02/03/2022   Necrotizing soft tissue infection    Cellulitis in diabetic foot ([place])    Osteomyelitis ([place]) 47/42/5956   Acute metabolic encephalopathy 38/75/6433   Severe sepsis ([place]) 02/02/2022   Acute respiratory failure with hypoxia ([place]) 02/02/2022   Protein-calorie malnutrition, mild ([place]) 02/02/2022   GERD (gastroesophageal reflux disease) 29/51/8841   Chronic systolic CHF (congestive heart failure) ([place]) 08/14/2019   LBBB (left bundle branch block) 08/13/2019   Problem with vascular access 08/13/2019   Dialysis AV fistula malfunction ([place]) 08/12/2019   ESRD (end stage renal disease) ([place]) 09/20/2018   Fever 09/20/2018   Nausea & vomiting 09/20/2018   DM2 (diabetes mellitus, type 2) ([place]) 09/20/2018   HTN (hypertension) 09/20/2018   Past Medical History:  Diagnosis Date   CHF (congestive heart failure) ([place])    Diabetes mellitus without complication ([place])    type 2   ESRD on hemodialysis ([place])    mon wed fri   Hypertension    Kidney failure    Myocardial infarction ([place])    Nonischemic cardiomyopathy ([place])     Family History  Problem Relation Age of Onset   Hypertension Mother     Past Surgical History:  Procedure Laterality Date   A/V  FISTULAGRAM N/A 08/15/2019   Procedure: A/V FISTULAGRAM;  Surgeon: [name], MD;  Location: [facility];  Service: Cardiovascular;  Laterality: N/A;   AMPUTATION Right 02/02/2022   Procedure: AMPUTATION 5th RAY;  Surgeon: [name], MD;  Location: [facility];  Service: Podiatry;  Laterality: Right;   AV FISTULA PLACEMENT     CARDIAC CATHETERIZATION  02/17/2018   LHC 02/17/18 ([name]): 30% pLAD, 30% dRCA, LVEDP 25-30, LVEF 25%.     CATARACT EXTRACTION W/PHACO Right 03/29/2020   Procedure: CATARACT EXTRACTION PHACO AND INTRAOCULAR LENS PLACEMENT ([place])  RIGHT DIABETIC;  Surgeon: [name], MD;  Location: [facility]  ORS;  Service: Ophthalmology;  Laterality: Right;  Lot # [id number] H [place]: 02:30.9 AP% 9.5% CDE: 14.38   EYE SURGERY Right    cataract removed   I & D EXTREMITY Right 02/02/2022   Procedure: IRRIGATION AND DEBRIDEMENT RIGHT FOOT;  Surgeon: [name], MD;  Location: [facility];  Service: Podiatry;  Laterality: Right;   INCISION AND DRAINAGE OF WOUND Right 02/08/2022   Procedure: IRRIGATION AND DEBRIDEMENT WOUND;  Surgeon: [name], DPM;  Location: [facility];  Service: Podiatry;  Laterality: Right;  with removal of infected bone    INSERTION OF DIALYSIS CATHETER  08/13/2019   Procedure: Insertion Of Dialysis Catheter;  Surgeon: [name], MD;  Location: [facility];  Service: Vascular;;   INSERTION OF DIALYSIS CATHETER Left 07/19/2020   Procedure: INSERTION OF DIALYSIS CATHETER;  Surgeon: [name], MD;  Location: [facility];  Service: Vascular;  Laterality: Left;   IRRIGATION AND DEBRIDEMENT ABSCESS     PERIPHERAL VASCULAR BALLOON ANGIOPLASTY  08/15/2019   Procedure: PERIPHERAL VASCULAR BALLOON ANGIOPLASTY;  Surgeon: [name], MD;  Location: [facility];  Service: Cardiovascular;;   PERIPHERAL VASCULAR BALLOON ANGIOPLASTY Left 02/06/2022   Procedure: PERIPHERAL VASCULAR BALLOON ANGIOPLASTY;  Surgeon: [name],  MD;  Location: [facility];  Service: Cardiovascular;  Laterality: Left;   REVISON OF ARTERIOVENOUS FISTULA Left 07/19/2020   Procedure: REVISON/PLICATION OF ARTERIOVENOUS FISTULA LEFT;  Surgeon: [name], MD;  Location: [facility] OR;  Service: Vascular;  Laterality: Left;   THROMBECTOMY AND REVISION OF ARTERIOVENTOUS (AV) GORETEX  GRAFT Left 08/13/2019   Procedure: THROMBECTOMY OF LEFT ARM ARTERIOVENTOUS (AV) FISTULA;  Surgeon: [name], MD;  Location: [facility] OR;  Service: Vascular;  Laterality: Left;   TOE AMPUTATION     Social History   Occupational History   Not on file  Tobacco Use   Smoking status: Never   Smokeless tobacco: Never  Vaping Use   Vaping Use: Never used  Substance and Sexual Activity   Alcohol use: Not Currently   Drug use: Never   Sexual activity: Not on file

## 2022-05-13 NOTE — Anesthesia Preprocedure Evaluation (Addendum)
Anesthesia Evaluation  Patient identified by MRN, date of birth, ID band Patient awake    Reviewed: Allergy & Precautions, H&P , NPO status , Patient's Chart, lab work & pertinent test results  Airway Mallampati: I  TM Distance: >3 FB Neck ROM: Full    Dental no notable dental hx. (+) Teeth Intact, Dental Advisory Given, Poor Dentition   Pulmonary neg pulmonary ROS, Patient abstained from smoking.,    Pulmonary exam normal breath sounds clear to auscultation       Cardiovascular Exercise Tolerance: Good hypertension, Pt. on medications and Pt. on home beta blockers + Past MI  negative cardio ROS Normal cardiovascular exam Rhythm:Regular Rate:Normal     Neuro/Psych CVA negative neurological ROS  negative psych ROS   GI/Hepatic negative GI ROS, Neg liver ROS, GERD  ,  Endo/Other  negative endocrine ROSdiabetes  Renal/GU Dialysis and ESRFRenal diseasenegative Renal ROS  negative genitourinary   Musculoskeletal negative musculoskeletal ROS (+)   Abdominal   Peds negative pediatric ROS (+)  Hematology negative hematology ROS (+) Blood dyscrasia, anemia ,   Anesthesia Other Findings   Reproductive/Obstetrics negative OB ROS                           Anesthesia Physical Anesthesia Plan  ASA: 4  Anesthesia Plan: General   Post-op Pain Management: Regional block*   Induction: Intravenous  PONV Risk Score and Plan: 2 and Ondansetron, Midazolam and Treatment may vary due to age or medical condition  Airway Management Planned: LMA  Additional Equipment: None  Intra-op Plan:   Post-operative Plan: Extubation in OR  Informed Consent: I have reviewed the patients History and Physical, chart, labs and discussed the procedure including the risks, benefits and alternatives for the proposed anesthesia with the patient or authorized representative who has indicated his/her understanding and  acceptance.     Dental advisory given  Plan Discussed with: CRNA, Surgeon and Anesthesiologist  Anesthesia Plan Comments: (PAT note by Karoline Caldwell, PA-C:  Follows with cardiology at [facility] for history of nonischemic cardiomyopathy, HFrEF, left bundle branch block.  History of [place] 2019 performed in [place] showing nonobstructive CAD.  He was last seen by Dr. Georgina Peer at [facility] on 05/23/2021.  Stable at that time, no ischemic symptoms reported.  Updated echo and nuclear stress were ordered as part of work-up for renal transplant evaluation.  Echo showed EF 45%.  Nuclear stress was difficult to interpret secondary to motion and attenuation, however read as probably low risk.  ESRD on HD Tuesday Thursday Saturday.  Patient has recently undergone multiple foot salvage intervention operations with podiatry. Recent prolonged admission related to this 02/02/2022 through 02/20/2022.  He had persistent altered mental status following surgery on 02/02/2022 and imaging was consistent with possible acute strokes.  Neurology was consulted and suspected it was related to hypotension.  His mental status gradually improved.  Seen by Dr. Sharol Given 05/12/2022, noted to have progressive wound dehiscence of the right lateral foot and radiographic evidence of osteomyelitis.  Transtibial amputation recommended.  IDDM 2, uncontrolled, last A1c 12.2 on 02/02/2022.  Pt will need DOS labs and evaluation.  EKG 02/16/2022: Sinus rhythm with 1st degree A-V block.  Rate 85. Left axis deviation. Left bundle branch block  Nuclear stress 06/06/2021 (Care Everywhere): - Equivocal myocardial perfusion study due to marked motion and  attenuation (probably low risk)  - Coronary calcificationsare noted  - Sensitivity and specificity of this test are reduced by  the noted motion  and attenuation  - Left ventricular systolic function is normal. Post stress the ejection  fraction is calculated at 53%. The left ventricle is mildly dilated.   - Sensitivity and specificity of this test are reduced by the noted motion  and attenuation (BMI>35)  - In the future, for larger patients or those with attenuation issues,  consider ordering PET rubidium study   TTE 05/23/2021 (Care Everywhere): Summary  1. The left ventricle is normal in size with moderately increased wall  thickness.  2. The left ventricular systolic function is mildly decreased, LVEF is  visually estimated at 45%.  3. The aortic valve is probably trileaflet with moderately thickened  leaflets with mildly reduced excursion.  4. The right ventricle is upper normal in size, with low normal systolic  function.  5. There is mild pulmonary hypertension, estimated pulmonary artery systolic  pressure is 48 mmHg.   Cardiac cath4/3/19 ([name]): Findings: 1. The left main arose from the left sinus of Valsalva and divided left anterior descending artery and circumflex. The left main was free of any disease. 2. The left anterior descending artery had 30% proximal disease. 3. The circumflex was nondominant medium caliber vessel and was free of any disease. Right coronary artery arose from right sinus of Valsalva. It was a dominant vessel and had 30% disease distally. 4. Left ventricular end-diastolic pressure was about 25-30. The ejection fraction was 25%. There was no gradient across aortic valve. The ejection fraction was globally impaired without regional wall motion abnormality. Summary: 1. Nonobstructive coronary artery disease. 2. Severely impaired left ventricular systolic function. Recommendations: 1. Optimize medication for CHF. 2. Recommend statins and aspirin 81 mg p.o. daily in view of nonobstructive coronary artery disease.  )      Anesthesia Quick Evaluation

## 2022-05-13 NOTE — Progress Notes (Signed)
Anesthesia Chart Review: Same day workup  Follows with cardiology at [facility] for history of nonischemic cardiomyopathy, HFrEF, left bundle branch block.  History of [place] 2019 performed in [place] showing nonobstructive CAD.  He was last seen by Dr. Nicholaus Bloom at [facility] on 05/23/2021.  Stable at that time, no ischemic symptoms reported.  Updated echo and nuclear stress were ordered as part of work-up for renal transplant evaluation.  Echo showed EF 45%.  Nuclear stress was difficult to interpret secondary to motion and attenuation, however read as probably low risk.  ESRD on HD Tuesday Thursday Saturday.  Patient has recently undergone multiple foot salvage intervention operations with podiatry. Recent prolonged admission related to this 02/02/2022 through 02/20/2022.  He had persistent altered mental status following surgery on 02/02/2022 and imaging was consistent with possible acute strokes.  Neurology was consulted and suspected it was related to hypotension.  His mental status gradually improved.  Seen by Dr. Lajoyce Corners 05/12/2022, noted to have progressive wound dehiscence of the right lateral foot and radiographic evidence of osteomyelitis.  Transtibial amputation recommended.  IDDM 2, uncontrolled, last A1c 12.2 on 02/02/2022.  Pt will need DOS labs and evaluation.  EKG 02/16/2022: Sinus rhythm with 1st degree A-V block.  Rate 85. Left axis deviation. Left bundle branch block  Nuclear stress 06/06/2021 (Care Everywhere): - Equivocal myocardial perfusion study due to marked motion and  attenuation (probably low risk)  - Coronary calcifications are noted  - Sensitivity and specificity of this test are reduced by the noted motion  and attenuation  - Left ventricular systolic function is normal. Post stress the ejection  fraction is calculated at 53%. The left ventricle is mildly dilated.  - Sensitivity and specificity of this test are reduced by the noted motion  and attenuation (BMI>35)  - In the  future, for larger patients or those with attenuation issues,  consider ordering PET rubidium study   TTE 05/23/2021 (Care Everywhere): Summary    1. The left ventricle is normal in size with moderately increased wall  thickness.    2. The left ventricular systolic function is mildly decreased, LVEF is  visually estimated at 45%.    3. The aortic valve is probably trileaflet with moderately thickened  leaflets with mildly reduced excursion.    4. The right ventricle is upper normal in size, with low normal systolic  function.    5. There is mild pulmonary hypertension, estimated pulmonary artery systolic  pressure is 48 mmHg.   Cardiac cath 02/17/18 [facility]): Findings: 1.  The left main arose from the left sinus of Valsalva and divided left anterior descending artery and circumflex.  The left main was free of any disease. 2.  The left anterior descending artery had 30% proximal disease. 3.  The circumflex was nondominant medium caliber vessel and was free of any disease.  Right coronary artery arose from right sinus of Valsalva.  It was a dominant vessel and had 30% disease distally. 4.  Left ventricular end-diastolic pressure was about 25-30.  The ejection fraction was 25%.  There was no gradient across aortic valve.  The ejection fraction was globally impaired without regional wall motion abnormality. Summary: 1.  Nonobstructive coronary artery disease. 2.  Severely impaired left ventricular systolic function. Recommendations: 1.  Optimize medication for CHF. 2.  Recommend statins and aspirin 81 mg p.o. daily in view of nonobstructive coronary artery disease.    [place], [place] [facility] Short Stay Center/Anesthesiology Phone [phone] 05/13/2022 3:33 PM

## 2022-05-14 ENCOUNTER — Telehealth: Payer: Self-pay

## 2022-05-14 NOTE — Telephone Encounter (Signed)
[name], RN wound care nurse called to report patient had a x-ray done on right foot. Results show positive for osteomyelitis in 4th metatarsal. [name] is faxing results to our office and wanted to know if patient would need MRI for further imaging. Call back number is [phone].    [place], CMA

## 2022-05-15 ENCOUNTER — Telehealth: Payer: Self-pay | Admitting: *Deleted

## 2022-05-15 NOTE — Telephone Encounter (Signed)
-----   Message from Felipa Furnace, DPM sent at 05/06/2022  3:33 PM EDT ----- Hi Akirah Storck,  Can you refer this patient to [facility] for below the knee amputation.  Patient requested it.  I already have the order and  Thank you

## 2022-05-15 NOTE — Telephone Encounter (Signed)
-----   Message from Felipa Furnace, DPM sent at 05/06/2022  3:33 PM EDT ----- Hi Iza Preston,  Can you refer this patient to [facility] for below the knee amputation.  Patient requested it.  I already have the order and  Thank you

## 2022-05-16 ENCOUNTER — Other Ambulatory Visit: Payer: Self-pay

## 2022-05-16 ENCOUNTER — Telehealth: Payer: Self-pay

## 2022-05-16 ENCOUNTER — Encounter: Payer: Self-pay | Admitting: Infectious Diseases

## 2022-05-16 ENCOUNTER — Ambulatory Visit (INDEPENDENT_AMBULATORY_CARE_PROVIDER_SITE_OTHER): Payer: Medicare Other | Admitting: Infectious Diseases

## 2022-05-16 VITALS — BP 132/80 | HR 84 | Temp 98.6°F | Ht 73.0 in | Wt 231.0 lb

## 2022-05-16 DIAGNOSIS — L97519 Non-pressure chronic ulcer of other part of right foot with unspecified severity: Secondary | ICD-10-CM | POA: Diagnosis not present

## 2022-05-16 DIAGNOSIS — E11621 Type 2 diabetes mellitus with foot ulcer: Secondary | ICD-10-CM

## 2022-05-16 DIAGNOSIS — M869 Osteomyelitis, unspecified: Secondary | ICD-10-CM

## 2022-05-16 NOTE — Progress Notes (Addendum)
Patient Active Problem List   Diagnosis Date Noted   Diabetic ulcer of right foot associated with type 2 diabetes mellitus ([facility] and E) 04/10/2022   Medication monitoring encounter 04/10/2022   Obesity (BMI 30-39.9) 02/19/2022   Hypotension 02/08/2022   Right foot infection    Stroke ([place]) 02/05/2022   Carotid stenosis 02/05/2022   Bacteremia    Hyponatremia 02/03/2022   Hyperkalemia 02/03/2022   High anion gap metabolic acidosis 54/65/0354   Anemia of chronic renal failure 02/03/2022   Necrotizing soft tissue infection    Cellulitis in diabetic foot ([place])    Osteomyelitis ([place]) 65/68/1275   Acute metabolic encephalopathy 17/00/1749   Severe sepsis ([place]) 02/02/2022   Acute respiratory failure with hypoxia ([place]) 02/02/2022   Protein-calorie malnutrition, mild ([place]) 02/02/2022   GERD (gastroesophageal reflux disease) 44/96/7591   Chronic systolic CHF (congestive heart failure) ([place]) 08/14/2019   LBBB (left bundle branch block) 08/13/2019   Problem with vascular access 08/13/2019   Dialysis AV fistula malfunction ([place]) 08/12/2019   ESRD (end stage renal disease) ([place]) 09/20/2018   Fever 09/20/2018   Nausea & vomiting 09/20/2018   DM2 (diabetes mellitus, type 2) ([place]) 09/20/2018   HTN (hypertension) 09/20/2018   Current Outpatient Medications on File Prior to Visit  Medication Sig Dispense Refill   AMLODIPINE BESYLATE PO Take 5 mg by mouth daily.     aspirin EC 81 MG EC tablet Take 1 tablet (81 mg total) by mouth daily. Swallow whole. 30 tablet 11   ceFEPIme 2 g in sodium chloride 0.9 % 100 mL Inject 2 g into the vein every Monday, Wednesday, and Friday at 8 PM. (Patient taking differently: Inject 2 g into the vein. Being given with hemodialysis; Tuesday-Thursday-Saturday)     doxycycline (VIBRAMYCIN) 100 MG capsule Take 100 mg by mouth daily. Give 1 capsule by mouth one time a day for prophylactic for 1 week     famotidine (PEPCID) 20 MG tablet Take 20 mg by mouth at bedtime.      guaiFENesin (MUCINEX) 600 MG 12 hr tablet Take 1,200 mg by mouth 2 (two) times daily. Give 2 tablet by mouth every 12 hours for congestion for 30 days     HYDROcodone-acetaminophen (NORCO/VICODIN) 5-325 MG tablet Take 1 tablet by mouth every 4 (four) hours as needed for moderate pain. Give 1 tablet by mouth every 4 hours as needed for acute pain related to right foot wound for 30 days     insulin glargine (LANTUS) 100 UNIT/ML injection Inject 30 Units into the skin daily. Inject 30 units subcutaneously one time a day     ipratropium-albuterol (DUONEB) 0.5-2.5 (3) MG/3ML SOLN Take 3 mLs by nebulization every 4 (four) hours as needed.     lidocaine-prilocaine (EMLA) cream Apply 1 application topically every Monday, Wednesday, and Friday with hemodialysis.   3   multivitamin (RENA-VIT) TABS tablet Take 1 tablet by mouth at bedtime.  0   pantoprazole (PROTONIX) 40 MG tablet Take 1 tablet (40 mg total) by mouth daily at 12 noon.     senna-docusate (SENOKOT-S) 8.6-50 MG tablet Take 1 tablet by mouth 2 (two) times daily.     sucroferric oxyhydroxide (VELPHORO) 500 MG chewable tablet Chew 1,000 mg by mouth 2 (two) times daily.     sucroferric oxyhydroxide (VELPHORO) 500 MG chewable tablet Chew 1,000 mg by mouth daily. Give 2 tablet by mouth one time a day every Mon, Wed, Fri, Sun with lunch     Nutritional Supplements (,FEEDING SUPPLEMENT, PROSOURCE  PLUS) liquid Take 30 mLs by mouth 2 (two) times daily between meals.     Nutritional Supplements (FEEDING SUPPLEMENT, NEPRO CARB STEADY,) LIQD Take 237 mLs by mouth 2 (two) times daily between meals.  0   No current facility-administered medications on file prior to visit.    Subjective: Here for HFU for Rt lateral foot osteomyelitis. After last seen on 5/22, patient was followed by Podiatry Dr Posey Pronto 5/26  where areas of exposed  bone was noted but no acute signs of infection. 6/16 fu with no exposed bone and good granular wound bed. His wound vac has already  been discontinued. He was referred to Orthopedics Dr Sharol Given for Rt BKA per patient's own request as he has been struggling with his foot wound for a long time. Plan was made for Rt BKA and was cancelled two times per patient as his daughter would cancel it. Patient has an Xray rt foot 6/27 where it was mentioned " cannot exclude OM of rt 4th metatarsal, clinical correlation required"and referred back to ID for MRI rt foot per daughter's request. Patient already scheduled for Rt BKA and I explained the daughter about MRI not being essential at this time. However, daughter wants to get MRI for definite confirmation of Osteomyelitis before BKA is done. Patient initially was not willing to have MRI done upon my conversation but then spoke with daughter over phone and agreeing to get MRI. He however seems to be willing to get RT BKA, however daughter seems to be pushing to avoid BKA.   Denies fevers, chills and sweats Denies nausea, vomiting  and diarrhea Wound in the rt foot with no signs of acute infection   Patient seems to be on doxycycline 100mg  po daily and cefepime with HD per [facility] but patient denies.   Review of Systems: ROS all systems reviewed with pertinent positives and negatives as listed above  Past Medical History:  Diagnosis Date   CHF (congestive heart failure) (HCC)    Diabetes mellitus without complication ([place])    type 2   ESRD on hemodialysis ([place])    mon wed fri   Hypertension    Kidney failure    Myocardial infarction ([place])    Nonischemic cardiomyopathy ([place])    Past Surgical History:  Procedure Laterality Date   A/V FISTULAGRAM N/A 08/15/2019   Procedure: A/V FISTULAGRAM;  Surgeon: [name], MD;  Location: [place] [facility];  Service: Cardiovascular;  Laterality: N/A;   AMPUTATION Right 02/02/2022   Procedure: AMPUTATION 5th RAY;  Surgeon: [name], MD;  Location: [facility];  Service: Podiatry;  Laterality: Right;   AV FISTULA PLACEMENT     CARDIAC  CATHETERIZATION  02/17/2018   LHC 02/17/18 ([name]): 30% pLAD, 30% dRCA, LVEDP 25-30, LVEF 25%.     CATARACT EXTRACTION W/PHACO Right 03/29/2020   Procedure: CATARACT EXTRACTION PHACO AND INTRAOCULAR LENS PLACEMENT ([place])  RIGHT DIABETIC;  Surgeon: [name], MD;  Location: [facility];  Service: Ophthalmology;  Laterality: Right;  Lot # [id number] H [place]: 02:30.9 AP% 9.5% CDE: 14.38   EYE SURGERY Right    cataract removed   I & D EXTREMITY Right 02/02/2022   Procedure: IRRIGATION AND DEBRIDEMENT RIGHT FOOT;  Surgeon: [name], MD;  Location: [facility];  Service: Podiatry;  Laterality: Right;   INCISION AND DRAINAGE OF WOUND Right 02/08/2022   Procedure: IRRIGATION AND DEBRIDEMENT WOUND;  Surgeon: [name], DPM;  Location: [facility];  Service: Podiatry;  Laterality: Right;  with removal of  infected bone    INSERTION OF DIALYSIS CATHETER  08/13/2019   Procedure: Insertion Of Dialysis Catheter;  Surgeon: [name], MD;  Location: [facility];  Service: Vascular;;   INSERTION OF DIALYSIS CATHETER Left 07/19/2020   Procedure: INSERTION OF DIALYSIS CATHETER;  Surgeon: [name], MD;  Location: [facility];  Service: Vascular;  Laterality: Left;   IRRIGATION AND DEBRIDEMENT ABSCESS     PERIPHERAL VASCULAR BALLOON ANGIOPLASTY  08/15/2019   Procedure: PERIPHERAL VASCULAR BALLOON ANGIOPLASTY;  Surgeon: [name], MD;  Location: [place] [facility];  Service: Cardiovascular;;   PERIPHERAL VASCULAR BALLOON ANGIOPLASTY Left 02/06/2022   Procedure: PERIPHERAL VASCULAR BALLOON ANGIOPLASTY;  Surgeon: [name], MD;  Location: [facility] [facility];  Service: Cardiovascular;  Laterality: Left;   REVISON OF ARTERIOVENOUS FISTULA Left 07/19/2020   Procedure: REVISON/PLICATION OF ARTERIOVENOUS FISTULA LEFT;  Surgeon: [name], MD;  Location[facility];  Service: Vascular;  Laterality: Left;   THROMBECTOMY AND REVISION OF ARTERIOVENTOUS (AV) GORETEX  GRAFT Left  08/13/2019   Procedure: THROMBECTOMY OF LEFT ARM ARTERIOVENTOUS (AV) FISTULA;  Surgeon: [name], MD;  Location: [facility];  Service: Vascular;  Laterality: Left;   TOE AMPUTATION       Social History   Tobacco Use   Smoking status: Never   Smokeless tobacco: Never  Vaping Use   Vaping Use: Never used  Substance Use Topics   Alcohol use: Not Currently   Drug use: Never    Family History  Problem Relation Age of Onset   Hypertension Mother     Allergies  Allergen Reactions   Sulfa Antibiotics Nausea And Vomiting    Health Maintenance  Topic Date Due   FOOT EXAM  Never done   OPHTHALMOLOGY EXAM  Never done   URINE MICROALBUMIN  Never done   Hepatitis C Screening  Never done   COLONOSCOPY (Pts 45-77yrs Insurance coverage will need to be confirmed)  Never done   Zoster Vaccines- Shingrix (1 of 2) Never done   TETANUS/TDAP  09/26/2019   COVID-19 Vaccine (4 - Moderna series) 10/26/2020   INFLUENZA VACCINE  06/17/2022   HEMOGLOBIN A1C  08/05/2022   HIV Screening  Completed   HPV VACCINES  Aged Out    Objective: There were no vitals taken for this visit.   Physical Exam Constitutional:      Appearance: Normal appearance.  HENT:     Head: Normocephalic and atraumatic.      Mouth: Mucous membranes are moist.  Eyes:    Conjunctiva/sclera: Conjunctivae normal.     Pupils:   Cardiovascular:     Rate and Rhythm: Normal rate and regular rhythm.     Heart sounds:  Pulmonary:     Effort: Pulmonary effort is normal.     Breath sounds: Normal breath sounds.   Abdominal:     General: Non distended     Palpations: soft.   Musculoskeletal:        General: Normal range of motion.   Rt Foot   Skin:    General: Skin is warm and dry.     Comments: left arm AVF - OK  Neurological:     General: grossly non focal     Mental Status: awake, alert and oriented to person, place, and time.   Psychiatric:        Mood and Affect: Mood normal.   Lab Results Lab  Results  Component Value Date   WBC 8.4 02/20/2022  HGB 8.7 (L) 02/20/2022   HCT 28.6 (L) 02/20/2022   MCV 97.9 02/20/2022   PLT 428 (H) 02/20/2022    Lab Results  Component Value Date   CREATININE 7.18 (H) 02/20/2022   BUN 21 (H) 02/20/2022   NA 133 (L) 02/20/2022   K 4.0 02/20/2022   CL 96 (L) 02/20/2022   CO2 27 02/20/2022    Lab Results  Component Value Date   ALT 12 02/20/2022   AST 18 02/20/2022   ALKPHOS 52 02/20/2022   BILITOT 0.3 02/20/2022    Lab Results  Component Value Date   CHOL 119 02/05/2022   HDL 11 (L) 02/05/2022   LDLCALC 46 02/05/2022   TRIG 312 (H) 02/05/2022   CHOLHDL 10.8 02/05/2022   No results found for: "LABRPR", "RPRTITER" No results found for: "HIV1RNAQUANT", "HIV1RNAVL", "CD4TABS"   Problem List Items Addressed This Visit       Endocrine   Diabetic ulcer of right foot associated with type 2 diabetes mellitus (HCC)   Relevant Medications   insulin glargine (LANTUS) 100 UNIT/ML injection     Musculoskeletal and Integument   Osteomyelitis ([place]) - Primary   Relevant Orders   MR FOOT RIGHT WO CONTRAST   CBC   C-reactive protein   Sedimentation rate   Basic metabolic panel     # RT DFU # ? 4th metatarsal osteomyelitis   #  ESRD on HD via left arm AVF  #  DM  No indication of abtx with no concerns of acute infection/sepsis/prior wound has pink granulation tissues  MRI rt foot per patient and daughter's request although less likely this would not change management in terms of need for BKA for long term prognosis Labs today  Fu with Dr Sharol Given for Rt BKA Fu to be made once MRI is done to see if any changes needed. Otherwise in 2-3 weeks   I have personally spent 45 minutes involved in face-to-face and non-face-to-face activities for this patient on the day of the visit. Professional time spent includes the following activities: Preparing to see the patient (review of tests), Obtaining and/or reviewing separately obtained history  (admission/discharge record), Performing a medically appropriate examination and/or evaluation , Ordering medications/tests/procedures, referring and communicating with other health care professionals, Documenting clinical information in the EMR, Independently interpreting results (not separately reported), Communicating results to the patient/family/caregiver, Counseling and educating the patient/family/caregiver and Care coordination (not separately reported).   Wilber Oliphant, [place] [facility] 05/16/2022, 1:36 PM

## 2022-05-16 NOTE — Telephone Encounter (Signed)
Called and spoke with Joneen Boers, Doctor, hospital at [facility]. Reviewed Report of Consultation sent back with patient from today's office visit.   Confirmed MRI request given to our referral coordinator. Confirmed patient's next follow-up appointment at [facility] on 06/11/22. Charleen stated understanding of f/u with ortho.  Clarified and communicated message to [place], RN that patient will have labs drawn at dialysis tomorrow (7/1), (CBC, BMP, ESR, CRP, and sed rate), so no need to draw at facility.  Verbalized understanding & all questions answered.  Binnie Kand, RN

## 2022-05-16 NOTE — Telephone Encounter (Addendum)
[place], and spoke with Joellen [place], [place]. Communicated verbal order per Dr. [place] Bali to draw the following labs at the patient's next dialysis appointment, this Saturday (05/17/22): CBC, CRP, BMP, and Sedimentation rate.  Katie verbalized understanding and provided read-back confirmation.  Phone number for [facility]: [phone].  Binnie Kand, RN   Received clarification from provider, and called back and spoke to [place], [place]. ESR will also be drawn on 05/17/22. Provided [facility] fax number for results.

## 2022-05-19 ENCOUNTER — Telehealth: Payer: Self-pay

## 2022-05-19 NOTE — Telephone Encounter (Signed)
Verbal orders given to Gerald Stabs, RN at [facility] patient is to stop Doxycyline and Cefepime per Dr.Manandhar. Gerald Stabs verbalized his understanding.   I also contacted patient SNF [facility] - there wasn't a RN or Manager available to take verbal orders.   [place], CMA

## 2022-05-19 NOTE — Telephone Encounter (Signed)
-----   Message from [name], MD sent at 05/19/2022  4:34 PM EDT ----- Team,  It seems patient was on doxycyline and cefepime with HD at the SNF for foot wound. I recommend to stop both of the abtx currently pending MRI of the foot.

## 2022-05-21 ENCOUNTER — Telehealth: Payer: Self-pay

## 2022-05-21 NOTE — Telephone Encounter (Signed)
Charlene at [facility] called asking when patient will have his MRI done. Patient is schedule for right below knee amputation on Friday. Patient daughter wants patient to have MRI done prior to surgery if MRI doesn't show osteomyelitis then patient doesn't need right BKA. I advised Charlene I would send message back to referral coordinator and Dr.Manandhar. Call back number [place], [place]

## 2022-05-21 NOTE — Telephone Encounter (Signed)
Charlene at [facility] aware MRI wouldn't be authorized and scheduled before 7/7 per Beaulah Corin referral coordinator. I informed Charlene of Dr.Manandhar response as well. [place] Patient stated that she would speak with patient daughter and will be back in touch with [place].  [place], CMA

## 2022-05-21 NOTE — Telephone Encounter (Signed)
I don't think MRI would change recommended management in terms of BKA advised by Dr Sharol Given. I ordered MRI per patient and more so the daughter wanted it and can be done as soon as possible.

## 2022-05-22 MED ORDER — HEPARIN SODIUM (PORCINE) 1000 UNIT/ML DIALYSIS: 3000.0000 [IU] | INTRAMUSCULAR | Status: DC | PRN

## 2022-05-22 NOTE — Telephone Encounter (Signed)
Per Joycelyn Schmid - patient scheduled for MRI on Friday 7/7.   [place], CMA

## 2022-05-22 NOTE — Progress Notes (Addendum)
I spoke to [place], Mr. Lamica's nurse at [facility]. Caryl Ada reports that patient is doing well  no complaints of shortness of breath, no s/s of Covid. Caryl Ada was told this am that Mr. Kotarski is having a MRI and having surgery.  Mr. Ferrari is now scheduled for a MRI at [facility] at 0700 and surgery at 0900. I sent left a voice message for Baird Lyons, Dr.Duda's scheduler if this is the plan.  Malachy Mood checked in to MRI, she reports that she spoke with Mr. Fleck daughter,Cierra Darin Engels said that Mr. Creeden will be having surgery in am , not a MRI.  Mr. Baldree 's nurse reported that patient's Lantus Insulin is given at 0800, it will not be given in am.

## 2022-05-22 NOTE — Pre-Procedure Instructions (Addendum)
Aarron Wierzbicki Sevigny Sr.  05/22/2022     Mr. Foots should arrive at [facility], Main Entrance or Entrance "A" at 0710 .                Your surgery or procedure is scheduled to begin at 9:40 AM   Call this number if you have problems the morning of surgery: [phone]  This is the number for the Pre- Surgical Desk.                >>>>>Please send patient's Medication Record with medications administrated documentation. ( this information is required prior to OR. This includes medications that may have been on hold for surgery)<<<<<    Remember:  Do not eat after midnight.  You may drink clear liquids until 0640 .  Clear liquids allowed are:                     Water, Juice (non-citric and without pulp - diabetics please choose diet or no sugar options), Carbonated beverages - (diabetics please choose diet or no sugar options), Clear Tea, Black Coffee only (no creamer, milk or cream including half and half), Plain Jell-O only (diabetics please choose diet or no sugar options), Gatorade (diabetics please choose diet or no sugar options), and Plain Popsicles only    Take these medicines the morning of surgery with A SIP OF WATER  AMLODIPINE BESYLATE pantoprazole (PROTONIX) senna-docusate (SENOKOT-S) guaiFENesin (MUCINEX)                  If needed take: senna-docusate (SENOKOT-S) HYDROcodone-acetaminophen (NORCO/VICODIN)  Nebulizer treatment  Follow the surgeon's instructions reguarding Aspirin. STOP taking  Aspirin Products (Goody Powder, Excedrin Migraine), Ibuprofen (Advil), Naproxen (Aleve), Vitamins and Herbal Products (ie Fish Oil).    How to Manage Your Diabetes Before and After Surgery  Why is it important to control my blood sugar before and after surgery? Improving blood sugar levels before and after surgery helps healing and can limit problems. A way of improving blood sugar control is eating a healthy diet by:  Eating less sugar and carbohydrates  Increasing  activity/exercise  Talking with your doctor about reaching your blood sugar goals High blood sugars (greater than 180 mg/dL) can raise your risk of infections and slow your recovery, so you will need to focus on controlling your diabetes during the weeks before surgery. Make sure that the doctor who takes care of your diabetes knows about your planned surgery including the date and location.  How do I manage my blood sugar before surgery? Check your blood sugar at least 4 times a day, starting 2 days before surgery, to make sure that the level is not too high or low. Check your blood sugar the morning of your surgery when you wake up and every 2 hours until you get to the Short Stay unit. If your blood sugar is less than 70 mg/dL, you will need to treat for low blood sugar: Do not take insulin. Treat a low blood sugar (less than 70 mg/dL) with  cup of clear juice (cranberry or apple), 4 glucose tablets, OR glucose gel. Recheck blood sugar in 15 minutes after treatment (to make sure it is greater than 70 mg/dL). If your blood sugar is not greater than 70 mg/dL on recheck, call [phone]  for further instructions. Report your blood sugar to the short stay nurse when you get to Short Stay.  If you are admitted to the  hospital after surgery: Your blood sugar will be checked by the staff and you will probably be given insulin after surgery (instead of oral diabetes medicines) to make sure you have good blood sugar levels. The goal for blood sugar control after surgery is 80-180 mg/dL.               WHAT DO I DO ABOUT MY DIABETES MEDICATION?   Do not take oral diabetes medicines (pills) the morning of surgery.  THE NIGHT BEFORE SURGERY, take ___________ units of ___________insulin.       THE MORNING OF SURGERY, take _____________ units of __________insulin.  The day of surgery, do not take other diabetes injectables, including Byetta (exenatide), Bydureon (exenatide ER),  Victoza (liraglutide), or Trulicity (dulaglutide).  If your CBG is greater than 220 mg/dL, you may take  of your sliding scale (correction) dose of insulin.  Other Instructions:          Patient Signature:  Date:   Nurse Signature:  Date:   Reviewed and Endorsed by [facility] Patient Education Committee, August 2015  Mr. Imhoff  should have a shower, with antibacteria soap, the morning of surgery . Dry off with a clean towel.  Patient should not have lotions, powders, colognes, deodorant, nail polish, jewelry, or piercing's. Wear clean comfortable clothes.  Brush teeth.   Contacts, dentures or bridgework may not be worn into surgery. If patient wears glasses, please send a glasses case with him; the hospital is not responsible for valuables and personal belongings.

## 2022-05-23 ENCOUNTER — Inpatient Hospital Stay (HOSPITAL_COMMUNITY)
Admission: RE | Admit: 2022-05-23 | Discharge: 2022-05-26 | DRG: 463 | Disposition: A | Discharge status: Skilled Nursing Facility | Payer: Medicare Other | Attending: Orthopedic Surgery | Admitting: Orthopedic Surgery

## 2022-05-23 ENCOUNTER — Inpatient Hospital Stay (HOSPITAL_COMMUNITY): Payer: Medicare Other | Admitting: Physician Assistant

## 2022-05-23 ENCOUNTER — Ambulatory Visit (HOSPITAL_BASED_OUTPATIENT_CLINIC_OR_DEPARTMENT_OTHER): Admission: RE | Admit: 2022-05-23 | Payer: Medicare Other | Source: Ambulatory Visit | Admitting: Orthopedic Surgery

## 2022-05-23 ENCOUNTER — Inpatient Hospital Stay (HOSPITAL_COMMUNITY)
Admission: RE | Disposition: A | Discharge status: Skilled Nursing Facility | Payer: Self-pay | Source: Home / Self Care | Attending: Orthopedic Surgery

## 2022-05-23 ENCOUNTER — Other Ambulatory Visit: Payer: Self-pay

## 2022-05-23 ENCOUNTER — Encounter (HOSPITAL_COMMUNITY): Payer: Self-pay | Admitting: Orthopedic Surgery

## 2022-05-23 DIAGNOSIS — N186 End stage renal disease: Secondary | ICD-10-CM | POA: Diagnosis present

## 2022-05-23 DIAGNOSIS — I10 Essential (primary) hypertension: Secondary | ICD-10-CM | POA: Diagnosis present

## 2022-05-23 DIAGNOSIS — E8889 Other specified metabolic disorders: Secondary | ICD-10-CM | POA: Diagnosis present

## 2022-05-23 DIAGNOSIS — E1169 Type 2 diabetes mellitus with other specified complication: Secondary | ICD-10-CM | POA: Diagnosis present

## 2022-05-23 DIAGNOSIS — I252 Old myocardial infarction: Secondary | ICD-10-CM | POA: Diagnosis not present

## 2022-05-23 DIAGNOSIS — M869 Osteomyelitis, unspecified: Secondary | ICD-10-CM

## 2022-05-23 DIAGNOSIS — E119 Type 2 diabetes mellitus without complications: Secondary | ICD-10-CM

## 2022-05-23 DIAGNOSIS — Z9841 Cataract extraction status, right eye: Secondary | ICD-10-CM

## 2022-05-23 DIAGNOSIS — M79671 Pain in right foot: Secondary | ICD-10-CM | POA: Diagnosis present

## 2022-05-23 DIAGNOSIS — Z882 Allergy status to sulfonamides status: Secondary | ICD-10-CM

## 2022-05-23 DIAGNOSIS — Z992 Dependence on renal dialysis: Secondary | ICD-10-CM | POA: Diagnosis not present

## 2022-05-23 DIAGNOSIS — E1151 Type 2 diabetes mellitus with diabetic peripheral angiopathy without gangrene: Secondary | ICD-10-CM | POA: Diagnosis present

## 2022-05-23 DIAGNOSIS — K219 Gastro-esophageal reflux disease without esophagitis: Secondary | ICD-10-CM | POA: Diagnosis present

## 2022-05-23 DIAGNOSIS — D62 Acute posthemorrhagic anemia: Secondary | ICD-10-CM | POA: Diagnosis not present

## 2022-05-23 DIAGNOSIS — S88111A Complete traumatic amputation at level between knee and ankle, right lower leg, initial encounter: Principal | ICD-10-CM | POA: Diagnosis present

## 2022-05-23 DIAGNOSIS — D72829 Elevated white blood cell count, unspecified: Secondary | ICD-10-CM

## 2022-05-23 DIAGNOSIS — M86271 Subacute osteomyelitis, right ankle and foot: Secondary | ICD-10-CM | POA: Diagnosis present

## 2022-05-23 DIAGNOSIS — I2510 Atherosclerotic heart disease of native coronary artery without angina pectoris: Secondary | ICD-10-CM | POA: Diagnosis present

## 2022-05-23 DIAGNOSIS — E785 Hyperlipidemia, unspecified: Secondary | ICD-10-CM | POA: Diagnosis present

## 2022-05-23 DIAGNOSIS — E1122 Type 2 diabetes mellitus with diabetic chronic kidney disease: Secondary | ICD-10-CM | POA: Diagnosis present

## 2022-05-23 DIAGNOSIS — I132 Hypertensive heart and chronic kidney disease with heart failure and with stage 5 chronic kidney disease, or end stage renal disease: Secondary | ICD-10-CM | POA: Diagnosis present

## 2022-05-23 DIAGNOSIS — Z794 Long term (current) use of insulin: Secondary | ICD-10-CM | POA: Diagnosis not present

## 2022-05-23 DIAGNOSIS — E11621 Type 2 diabetes mellitus with foot ulcer: Secondary | ICD-10-CM

## 2022-05-23 DIAGNOSIS — I428 Other cardiomyopathies: Secondary | ICD-10-CM | POA: Diagnosis present

## 2022-05-23 DIAGNOSIS — L97519 Non-pressure chronic ulcer of other part of right foot with unspecified severity: Secondary | ICD-10-CM | POA: Diagnosis present

## 2022-05-23 DIAGNOSIS — Y838 Other surgical procedures as the cause of abnormal reaction of the patient, or of later complication, without mention of misadventure at the time of the procedure: Secondary | ICD-10-CM | POA: Diagnosis present

## 2022-05-23 DIAGNOSIS — I5022 Chronic systolic (congestive) heart failure: Secondary | ICD-10-CM | POA: Diagnosis present

## 2022-05-23 DIAGNOSIS — N189 Chronic kidney disease, unspecified: Secondary | ICD-10-CM | POA: Diagnosis present

## 2022-05-23 DIAGNOSIS — Z89421 Acquired absence of other right toe(s): Secondary | ICD-10-CM

## 2022-05-23 DIAGNOSIS — Z961 Presence of intraocular lens: Secondary | ICD-10-CM | POA: Diagnosis present

## 2022-05-23 DIAGNOSIS — L02611 Cutaneous abscess of right foot: Secondary | ICD-10-CM | POA: Diagnosis present

## 2022-05-23 DIAGNOSIS — Z79899 Other long term (current) drug therapy: Secondary | ICD-10-CM

## 2022-05-23 DIAGNOSIS — Z7982 Long term (current) use of aspirin: Secondary | ICD-10-CM

## 2022-05-23 DIAGNOSIS — N2581 Secondary hyperparathyroidism of renal origin: Secondary | ICD-10-CM | POA: Diagnosis present

## 2022-05-23 DIAGNOSIS — I272 Pulmonary hypertension, unspecified: Secondary | ICD-10-CM | POA: Diagnosis present

## 2022-05-23 DIAGNOSIS — T8130XA Disruption of wound, unspecified, initial encounter: Secondary | ICD-10-CM | POA: Diagnosis present

## 2022-05-23 DIAGNOSIS — D631 Anemia in chronic kidney disease: Secondary | ICD-10-CM | POA: Diagnosis present

## 2022-05-23 DIAGNOSIS — I5023 Acute on chronic systolic (congestive) heart failure: Secondary | ICD-10-CM | POA: Diagnosis present

## 2022-05-23 DIAGNOSIS — Z8249 Family history of ischemic heart disease and other diseases of the circulatory system: Secondary | ICD-10-CM

## 2022-05-23 DIAGNOSIS — Z751 Person awaiting admission to adequate facility elsewhere: Secondary | ICD-10-CM

## 2022-05-23 DIAGNOSIS — Z8673 Personal history of transient ischemic attack (TIA), and cerebral infarction without residual deficits: Secondary | ICD-10-CM

## 2022-05-23 DIAGNOSIS — E039 Hypothyroidism, unspecified: Secondary | ICD-10-CM | POA: Diagnosis present

## 2022-05-23 HISTORY — PX: AMPUTATION: SHX166

## 2022-05-23 HISTORY — PX: APPLICATION OF WOUND VAC: SHX5189

## 2022-05-23 LAB — POCT I-STAT, CHEM 8
BUN: 24 mg/dL — ABNORMAL HIGH (ref 6–20)
Calcium, Ion: 1.11 mmol/L — ABNORMAL LOW (ref 1.15–1.40)
Chloride: 98 mmol/L (ref 98–111)
Creatinine, Ser: 7.4 mg/dL — ABNORMAL HIGH (ref 0.61–1.24)
Glucose, Bld: 228 mg/dL — ABNORMAL HIGH (ref 70–99)
HCT: 34 % — ABNORMAL LOW (ref 39.0–52.0)
Hemoglobin: 11.6 g/dL — ABNORMAL LOW (ref 13.0–17.0)
Potassium: 6.1 mmol/L — ABNORMAL HIGH (ref 3.5–5.1)
Sodium: 137 mmol/L (ref 135–145)
TCO2: 29 mmol/L (ref 22–32)

## 2022-05-23 LAB — GLUCOSE, CAPILLARY
Glucose-Capillary: 227 mg/dL — ABNORMAL HIGH (ref 70–99)
Glucose-Capillary: 162 mg/dL — ABNORMAL HIGH (ref 70–99)
Glucose-Capillary: 146 mg/dL — ABNORMAL HIGH (ref 70–99)
Glucose-Capillary: 168 mg/dL — ABNORMAL HIGH (ref 70–99)

## 2022-05-23 LAB — BASIC METABOLIC PANEL
Anion gap: 15 (ref 5–15)
BUN: 17 mg/dL (ref 6–20)
CO2: 28 mmol/L (ref 22–32)
Calcium: 9.7 mg/dL (ref 8.9–10.3)
Chloride: 95 mmol/L — ABNORMAL LOW (ref 98–111)
Creatinine, Ser: 7.08 mg/dL — ABNORMAL HIGH (ref 0.61–1.24)
GFR, Estimated: 8 mL/min — ABNORMAL LOW (ref >60)
Glucose, Bld: 209 mg/dL — ABNORMAL HIGH (ref 70–99)
Potassium: 4.4 mmol/L (ref 3.5–5.1)
Sodium: 138 mmol/L (ref 135–145)

## 2022-05-23 LAB — HEPATITIS C ANTIBODY: HCV Ab: NONREACTIVE

## 2022-05-23 LAB — HEPATITIS B SURFACE ANTIGEN: Hepatitis B Surface Ag: NONREACTIVE

## 2022-05-23 LAB — HEPATITIS B CORE ANTIBODY, TOTAL: Hep B Core Total Ab: NONREACTIVE

## 2022-05-23 LAB — HEPATITIS B SURFACE ANTIBODY,QUALITATIVE: Hep B S Ab: REACTIVE — AB

## 2022-05-23 SURGERY — AMPUTATION BELOW KNEE
Anesthesia: General | Site: Knee | Laterality: Right

## 2022-05-23 MED ORDER — BUPIVACAINE-EPINEPHRINE (PF) 0.5% -1:200000 IJ SOLN
INTRAMUSCULAR | Status: DC | PRN
  Administered 2022-05-23: 10 mL via PERINEURAL

## 2022-05-23 MED ORDER — CHLORHEXIDINE GLUCONATE CLOTH 2 % EX PADS
6.0000 | MEDICATED_PAD | Freq: Every day | CUTANEOUS | Status: DC
  Administered 2022-05-24 – 2022-05-26 (×3): 6 via TOPICAL

## 2022-05-23 MED ORDER — ONDANSETRON HCL 4 MG/2ML IJ SOLN
INTRAMUSCULAR | Status: AC
Start: 2022-05-23 — End: ?
  Filled 2022-05-23: qty 2

## 2022-05-23 MED ORDER — POLYETHYLENE GLYCOL 3350 17 G PO PACK: 17.0000 g | PACK | Freq: Every day | ORAL | Status: DC | PRN

## 2022-05-23 MED ORDER — POTASSIUM CHLORIDE CRYS ER 20 MEQ PO TBCR: 20.0000 meq | EXTENDED_RELEASE_TABLET | Freq: Every day | ORAL | Status: DC | PRN

## 2022-05-23 MED ORDER — OXYCODONE HCL 5 MG PO TABS
10.0000 mg | ORAL_TABLET | ORAL | Status: DC | PRN
  Administered 2022-05-25: 15 mg via ORAL
  Administered 2022-05-25: 10 mg via ORAL
  Administered 2022-05-26: 15 mg via ORAL
  Filled 2022-05-23: qty 2
  Filled 2022-05-23 (×2): qty 3

## 2022-05-23 MED ORDER — INSULIN ASPART 100 UNIT/ML IJ SOLN
0.0000 [IU] | Freq: Three times a day (TID) | INTRAMUSCULAR | Status: DC
  Administered 2022-05-23: 1 [IU] via SUBCUTANEOUS
  Administered 2022-05-24: 2 [IU] via SUBCUTANEOUS

## 2022-05-23 MED ORDER — SUCROFERRIC OXYHYDROXIDE 500 MG PO CHEW
1000.0000 mg | CHEWABLE_TABLET | ORAL | Status: DC
  Filled 2022-05-23 (×2): qty 2

## 2022-05-23 MED ORDER — MAGNESIUM CITRATE PO SOLN: 1.0000 | Freq: Once | ORAL | Status: DC | PRN

## 2022-05-23 MED ORDER — CHLORHEXIDINE GLUCONATE 0.12 % MT SOLN
15.0000 mL | Freq: Once | OROMUCOSAL | Status: AC
  Administered 2022-05-23: 15 mL via OROMUCOSAL
  Filled 2022-05-23: qty 15

## 2022-05-23 MED ORDER — BUPIVACAINE LIPOSOME 1.3 % IJ SUSP
INTRAMUSCULAR | Status: DC | PRN
  Administered 2022-05-23: 10 mL via PERINEURAL

## 2022-05-23 MED ORDER — MAGNESIUM SULFATE 2 GM/50ML IV SOLN: 2.0000 g | Freq: Every day | INTRAVENOUS | Status: DC | PRN

## 2022-05-23 MED ORDER — FENTANYL CITRATE (PF) 100 MCG/2ML IJ SOLN: 25.0000 ug | INTRAMUSCULAR | Status: DC | PRN

## 2022-05-23 MED ORDER — MIDAZOLAM HCL 2 MG/2ML IJ SOLN: 2.0000 mg | Freq: Once | INTRAMUSCULAR | Status: AC

## 2022-05-23 MED ORDER — ANTICOAGULANT SODIUM CITRATE 4% (200MG/5ML) IV SOLN
5.0000 mL | Status: DC | PRN
Start: 2022-05-23 — End: 2022-05-24

## 2022-05-23 MED ORDER — INSULIN ASPART 100 UNIT/ML IJ SOLN
2.0000 [IU] | Freq: Three times a day (TID) | INTRAMUSCULAR | Status: DC
  Administered 2022-05-23 – 2022-05-26 (×3): 2 [IU] via SUBCUTANEOUS

## 2022-05-23 MED ORDER — MIDAZOLAM HCL 2 MG/2ML IJ SOLN
INTRAMUSCULAR | Status: AC
  Administered 2022-05-23: 2 mg via INTRAVENOUS
  Filled 2022-05-23: qty 2

## 2022-05-23 MED ORDER — ORAL CARE MOUTH RINSE: 15.0000 mL | Freq: Once | OROMUCOSAL | Status: AC

## 2022-05-23 MED ORDER — AMLODIPINE BESYLATE 5 MG PO TABS
5.0000 mg | ORAL_TABLET | Freq: Every day | ORAL | Status: DC
  Administered 2022-05-24 – 2022-05-26 (×3): 5 mg via ORAL
  Filled 2022-05-23 (×3): qty 1

## 2022-05-23 MED ORDER — ONDANSETRON HCL 4 MG/2ML IJ SOLN
INTRAMUSCULAR | Status: DC | PRN
  Administered 2022-05-23: 4 mg via INTRAVENOUS

## 2022-05-23 MED ORDER — LABETALOL HCL 5 MG/ML IV SOLN: 10.0000 mg | INTRAVENOUS | Status: DC | PRN

## 2022-05-23 MED ORDER — PROPOFOL 10 MG/ML IV BOLUS
INTRAVENOUS | Status: DC | PRN
  Administered 2022-05-23: 150 mg via INTRAVENOUS

## 2022-05-23 MED ORDER — GUAIFENESIN-DM 100-10 MG/5ML PO SYRP
15.0000 mL | ORAL_SOLUTION | ORAL | Status: DC | PRN
  Administered 2022-05-23 – 2022-05-25 (×2): 15 mL via ORAL
  Filled 2022-05-23: qty 20
  Filled 2022-05-23 (×2): qty 15

## 2022-05-23 MED ORDER — PENTAFLUOROPROP-TETRAFLUOROETH EX AERO: 1.0000 | INHALATION_SPRAY | CUTANEOUS | Status: DC | PRN

## 2022-05-23 MED ORDER — PHENYLEPHRINE HCL-NACL 20-0.9 MG/250ML-% IV SOLN
INTRAVENOUS | Status: DC | PRN
  Administered 2022-05-23: 30 ug/min via INTRAVENOUS

## 2022-05-23 MED ORDER — DOCUSATE SODIUM 100 MG PO CAPS
100.0000 mg | ORAL_CAPSULE | Freq: Every day | ORAL | Status: DC
  Filled 2022-05-23 (×2): qty 1

## 2022-05-23 MED ORDER — ACETAMINOPHEN 325 MG PO TABS: 325.0000 mg | ORAL_TABLET | ORAL | Status: DC | PRN

## 2022-05-23 MED ORDER — ACETAMINOPHEN 325 MG PO TABS
325.0000 mg | ORAL_TABLET | Freq: Four times a day (QID) | ORAL | Status: DC | PRN
  Administered 2022-05-24: 650 mg via ORAL
  Filled 2022-05-23: qty 2

## 2022-05-23 MED ORDER — HYDROMORPHONE HCL 1 MG/ML IJ SOLN
0.5000 mg | INTRAMUSCULAR | Status: DC | PRN
  Administered 2022-05-24 – 2022-05-25 (×4): 1 mg via INTRAVENOUS
  Filled 2022-05-23 (×4): qty 1

## 2022-05-23 MED ORDER — INSULIN ASPART 100 UNIT/ML IJ SOLN
0.0000 [IU] | INTRAMUSCULAR | Status: DC | PRN
  Administered 2022-05-23: 3 [IU] via SUBCUTANEOUS

## 2022-05-23 MED ORDER — SUCROFERRIC OXYHYDROXIDE 500 MG PO CHEW
1000.0000 mg | CHEWABLE_TABLET | Freq: Two times a day (BID) | ORAL | Status: DC
  Filled 2022-05-23 (×7): qty 2

## 2022-05-23 MED ORDER — ROPIVACAINE HCL 5 MG/ML IJ SOLN
INTRAMUSCULAR | Status: DC | PRN
  Administered 2022-05-23: 15 mL via PERINEURAL

## 2022-05-23 MED ORDER — PANTOPRAZOLE SODIUM 40 MG PO TBEC
40.0000 mg | DELAYED_RELEASE_TABLET | Freq: Every day | ORAL | Status: DC
  Administered 2022-05-24 – 2022-05-26 (×3): 40 mg via ORAL
  Filled 2022-05-23 (×3): qty 1

## 2022-05-23 MED ORDER — MEPERIDINE HCL 25 MG/ML IJ SOLN: 6.2500 mg | INTRAMUSCULAR | Status: DC | PRN

## 2022-05-23 MED ORDER — ONDANSETRON HCL 4 MG/2ML IJ SOLN: 4.0000 mg | Freq: Once | INTRAMUSCULAR | Status: DC | PRN

## 2022-05-23 MED ORDER — ASPIRIN 81 MG PO TBEC
81.0000 mg | DELAYED_RELEASE_TABLET | Freq: Every day | ORAL | Status: DC
  Administered 2022-05-23 – 2022-05-26 (×4): 81 mg via ORAL
  Filled 2022-05-23 (×4): qty 1

## 2022-05-23 MED ORDER — TRANEXAMIC ACID-NACL 1000-0.7 MG/100ML-% IV SOLN
1000.0000 mg | INTRAVENOUS | Status: AC
  Administered 2022-05-23: 1000 mg via INTRAVENOUS
  Filled 2022-05-23: qty 100

## 2022-05-23 MED ORDER — SODIUM CHLORIDE 0.9 % IV SOLN: INTRAVENOUS | Status: DC

## 2022-05-23 MED ORDER — OXYCODONE HCL 5 MG/5ML PO SOLN: 5.0000 mg | Freq: Once | ORAL | Status: DC | PRN

## 2022-05-23 MED ORDER — FENTANYL CITRATE (PF) 100 MCG/2ML IJ SOLN
INTRAMUSCULAR | Status: AC
  Administered 2022-05-23: 100 ug via INTRAVENOUS
  Filled 2022-05-23: qty 2

## 2022-05-23 MED ORDER — JUVEN PO PACK
1.0000 | PACK | Freq: Two times a day (BID) | ORAL | Status: DC
Start: 2022-05-24 — End: 2022-05-26
  Administered 2022-05-26: 1 via ORAL
  Filled 2022-05-23 (×2): qty 1

## 2022-05-23 MED ORDER — PHENYLEPHRINE 80 MCG/ML (10ML) SYRINGE FOR IV PUSH (FOR BLOOD PRESSURE SUPPORT)
PREFILLED_SYRINGE | INTRAVENOUS | Status: DC | PRN
  Administered 2022-05-23: 80 ug via INTRAVENOUS
  Administered 2022-05-23: 160 ug via INTRAVENOUS
  Administered 2022-05-23 (×2): 80 ug via INTRAVENOUS

## 2022-05-23 MED ORDER — LIDOCAINE 2% (20 MG/ML) 5 ML SYRINGE
INTRAMUSCULAR | Status: AC
  Filled 2022-05-23: qty 5

## 2022-05-23 MED ORDER — HEPARIN SODIUM (PORCINE) 1000 UNIT/ML DIALYSIS: 1000.0000 [IU] | INTRAMUSCULAR | Status: DC | PRN

## 2022-05-23 MED ORDER — BISACODYL 5 MG PO TBEC: 5.0000 mg | DELAYED_RELEASE_TABLET | Freq: Every day | ORAL | Status: DC | PRN

## 2022-05-23 MED ORDER — 0.9 % SODIUM CHLORIDE (POUR BTL) OPTIME
TOPICAL | Status: DC | PRN
  Administered 2022-05-23: 1000 mL

## 2022-05-23 MED ORDER — ONDANSETRON HCL 4 MG/2ML IJ SOLN: 4.0000 mg | Freq: Four times a day (QID) | INTRAMUSCULAR | Status: DC | PRN

## 2022-05-23 MED ORDER — METOPROLOL TARTRATE 5 MG/5ML IV SOLN: 2.0000 mg | INTRAVENOUS | Status: DC | PRN

## 2022-05-23 MED ORDER — INSULIN GLARGINE-YFGN 100 UNIT/ML ~~LOC~~ SOLN
20.0000 [IU] | Freq: Every day | SUBCUTANEOUS | Status: DC
  Administered 2022-05-23 – 2022-05-25 (×3): 20 [IU] via SUBCUTANEOUS
  Filled 2022-05-23 (×4): qty 0.2

## 2022-05-23 MED ORDER — FENTANYL CITRATE (PF) 100 MCG/2ML IJ SOLN: 100.0000 ug | Freq: Once | INTRAMUSCULAR | Status: AC

## 2022-05-23 MED ORDER — OXYCODONE HCL 5 MG PO TABS
5.0000 mg | ORAL_TABLET | ORAL | Status: DC | PRN
  Administered 2022-05-24: 10 mg via ORAL
  Filled 2022-05-23: qty 2

## 2022-05-23 MED ORDER — ACETAMINOPHEN 160 MG/5ML PO SOLN: 325.0000 mg | ORAL | Status: DC | PRN

## 2022-05-23 MED ORDER — DEXAMETHASONE SODIUM PHOSPHATE 10 MG/ML IJ SOLN
INTRAMUSCULAR | Status: AC
Start: 2022-05-23 — End: ?
  Filled 2022-05-23: qty 1

## 2022-05-23 MED ORDER — LIDOCAINE-PRILOCAINE 2.5-2.5 % EX CREA: 1.0000 | TOPICAL_CREAM | CUTANEOUS | Status: DC | PRN

## 2022-05-23 MED ORDER — LIDOCAINE HCL (CARDIAC) PF 100 MG/5ML IV SOSY
PREFILLED_SYRINGE | INTRAVENOUS | Status: DC | PRN
  Administered 2022-05-23: 50 mg via INTRAVENOUS

## 2022-05-23 MED ORDER — LIDOCAINE HCL (PF) 1 % IJ SOLN
5.0000 mL | INTRAMUSCULAR | Status: DC | PRN
Start: 2022-05-23 — End: 2022-05-24

## 2022-05-23 MED ORDER — ZINC SULFATE 220 (50 ZN) MG PO CAPS
220.0000 mg | ORAL_CAPSULE | Freq: Every day | ORAL | Status: DC
  Administered 2022-05-23 – 2022-05-26 (×4): 220 mg via ORAL
  Filled 2022-05-23 (×4): qty 1

## 2022-05-23 MED ORDER — CEFAZOLIN SODIUM-DEXTROSE 2-4 GM/100ML-% IV SOLN
2.0000 g | Freq: Three times a day (TID) | INTRAVENOUS | Status: AC
  Administered 2022-05-23 – 2022-05-24 (×2): 2 g via INTRAVENOUS
  Filled 2022-05-23 (×2): qty 100

## 2022-05-23 MED ORDER — CEFAZOLIN SODIUM-DEXTROSE 2-4 GM/100ML-% IV SOLN
2.0000 g | INTRAVENOUS | Status: AC
  Administered 2022-05-23: 2 g via INTRAVENOUS
  Filled 2022-05-23: qty 100

## 2022-05-23 MED ORDER — PHENYLEPHRINE 80 MCG/ML (10ML) SYRINGE FOR IV PUSH (FOR BLOOD PRESSURE SUPPORT)
PREFILLED_SYRINGE | INTRAVENOUS | Status: AC
  Filled 2022-05-23: qty 10

## 2022-05-23 MED ORDER — ALUM & MAG HYDROXIDE-SIMETH 200-200-20 MG/5ML PO SUSP: 15.0000 mL | ORAL | Status: DC | PRN

## 2022-05-23 MED ORDER — PANTOPRAZOLE SODIUM 40 MG PO TBEC: 40.0000 mg | DELAYED_RELEASE_TABLET | Freq: Every day | ORAL | Status: DC

## 2022-05-23 MED ORDER — HYDRALAZINE HCL 20 MG/ML IJ SOLN: 5.0000 mg | INTRAMUSCULAR | Status: DC | PRN

## 2022-05-23 MED ORDER — ASCORBIC ACID 500 MG PO TABS
1000.0000 mg | ORAL_TABLET | Freq: Every day | ORAL | Status: DC
  Administered 2022-05-23 – 2022-05-26 (×4): 1000 mg via ORAL
  Filled 2022-05-23 (×4): qty 2

## 2022-05-23 MED ORDER — IPRATROPIUM-ALBUTEROL 0.5-2.5 (3) MG/3ML IN SOLN: 3.0000 mL | RESPIRATORY_TRACT | Status: DC | PRN

## 2022-05-23 MED ORDER — ALTEPLASE 2 MG IJ SOLR: 2.0000 mg | Freq: Once | INTRAMUSCULAR | Status: DC | PRN

## 2022-05-23 MED ORDER — PHENOL 1.4 % MT LIQD: 1.0000 | OROMUCOSAL | Status: DC | PRN

## 2022-05-23 MED ORDER — OXYCODONE HCL 5 MG PO TABS: 5.0000 mg | ORAL_TABLET | Freq: Once | ORAL | Status: DC | PRN

## 2022-05-23 SURGICAL SUPPLY — 38 items
BAG COUNTER SPONGE SURGICOUNT (BAG)
BLADE SAW RECIP 87.9 MT (BLADE) ×3
BLADE SURG 21 STRL SS (BLADE) ×3
BNDG COHESIVE 6X5 TAN STRL LF (GAUZE/BANDAGES/DRESSINGS)
CANISTER WOUND CARE 500ML ATS (WOUND CARE) ×3
COVER SURGICAL LIGHT HANDLE (MISCELLANEOUS) ×3
CUFF TOURN SGL QUICK 34 (TOURNIQUET CUFF) ×2
CUFF TRNQT CYL 34X4.125X (TOURNIQUET CUFF) ×2
DRAPE DERMATAC (DRAPES) ×6
DRAPE INCISE IOBAN 66X45 STRL (DRAPES) ×3
DRAPE U-SHAPE 47X51 STRL (DRAPES) ×3
DRSG PREVENA PLUS CUSTOM (GAUZE/BANDAGES/DRESSINGS) ×3
DURAPREP 26ML APPLICATOR (WOUND CARE) ×3
ELECT REM PT RETURN 9FT ADLT (ELECTROSURGICAL) ×3
GLOVE BIOGEL PI IND STRL 9 (GLOVE) ×2
GLOVE BIOGEL PI INDICATOR 9 (GLOVE) ×1
GLOVE SURG ORTHO 9.0 STRL STRW (GLOVE) ×3
GOWN STRL REUS W/ TWL XL LVL3 (GOWN DISPOSABLE) ×4
GOWN STRL REUS W/TWL XL LVL3 (GOWN DISPOSABLE) ×2
GRAFT SKIN WND MICRO 38 (Tissue) ×3 IMPLANT
GRAFT SKIN WND OMEGA3 SB 7X10 (Tissue) ×3 IMPLANT
KIT BASIN OR (CUSTOM PROCEDURE TRAY) ×3
KIT TURNOVER KIT B (KITS) ×3
MANIFOLD NEPTUNE II (INSTRUMENTS) ×3
NS IRRIG 1000ML POUR BTL (IV SOLUTION) ×3
PACK ORTHO EXTREMITY (CUSTOM PROCEDURE TRAY) ×3
PAD ARMBOARD 7.5X6 YLW CONV (MISCELLANEOUS) ×3
PREVENA RESTOR ARTHOFORM 46X30 (CANNISTER) ×3
SPONGE T-LAP 18X18 ~~LOC~~+RFID (SPONGE) ×3
STAPLER VISISTAT 35W (STAPLE)
STOCKINETTE IMPERVIOUS LG (DRAPES) ×3
SUT ETHILON 2 0 PSLX (SUTURE)
SUT SILK 2 0 (SUTURE) ×2
SUT SILK 2-0 18XBRD TIE 12 (SUTURE) ×2
SUT VIC AB 1 CTX 27 (SUTURE) ×6
TOWEL GREEN STERILE (TOWEL DISPOSABLE) ×3
TUBE CONNECTING 12X1/4 (SUCTIONS) ×3
YANKAUER SUCT BULB TIP NO VENT (SUCTIONS) ×3

## 2022-05-23 NOTE — Transfer of Care (Signed)
Immediate Anesthesia Transfer of Care Note  Patient: [name] [name].  Procedure(s) Performed: RIGHT BELOW KNEE AMPUTATION (Right: Knee) APPLICATION OF WOUND VAC  Patient Location: PACU  Anesthesia Type:GA combined with regional for post-op pain  Level of Consciousness: awake, alert  and oriented  Airway & Oxygen Therapy: Patient Spontanous Breathing and Patient connected to nasal cannula oxygen  Post-op Assessment: Report given to RN, Post -op Vital signs reviewed and stable and Patient able to stick tongue midline  Post vital signs: Reviewed  Last Vitals:  Vitals Value Taken Time  BP 121/73 05/23/22 1031  Temp 97.9   Pulse 81 05/23/22 1035  Resp 31 05/23/22 1035  SpO2 94 % 05/23/22 1035  Vitals shown include unvalidated device data.  Last Pain:  Vitals:   05/23/22 0747  TempSrc:   PainSc: 8          Complications: No notable events documented.

## 2022-05-23 NOTE — Anesthesia Procedure Notes (Signed)
Procedure Name: LMA Insertion Date/Time: 05/23/2022 9:54 AM  Performed by: Maude Leriche, CRNAPre-anesthesia Checklist: Patient identified, Emergency Drugs available, Suction available, Patient being monitored and Timeout performed Patient Re-evaluated:Patient Re-evaluated prior to induction Oxygen Delivery Method: Circle system utilized Preoxygenation: Pre-oxygenation with 100% oxygen Induction Type: IV induction LMA: LMA inserted LMA Size: 5.0 Number of attempts: 1 Placement Confirmation: positive ETCO2 and breath sounds checked- equal and bilateral Tube secured with: Tape Dental Injury: Teeth and Oropharynx as per pre-operative assessment

## 2022-05-23 NOTE — Op Note (Signed)
05/23/2022  10:52 AM  PATIENT:  [name] Cruel Sr.    PRE-OPERATIVE DIAGNOSIS:  Ulceration and Osteomyelitis Right Foot  POST-OPERATIVE DIAGNOSIS:  Same  PROCEDURE:  RIGHT BELOW KNEE AMPUTATION, APPLICATION OF WOUND VAC Application of Kerecis tissue graft 70 cm sheet and 38 cm micro.  SURGEON:  [name], MD  ANESTHESIA:   General  PREOPERATIVE INDICATIONS:  [name] Cruel Sr. is a  60 y.o. male with a diagnosis of Ulceration and Osteomyelitis Right Foot who failed conservative measures and elected for surgical management.    The risks benefits and alternatives were discussed with the patient preoperatively including but not limited to the risks of infection, bleeding, nerve injury, cardiopulmonary complications, the need for revision surgery, among others, and the patient was willing to proceed.  OPERATIVE IMPLANTS: Application of Kerecis tissue graft 70 cm sheet and 38 cm micro   OPERATIVE FINDINGS: Tissue margins were healthy and viable arteries were calcified  OPERATIVE PROCEDURE: Patient was brought to the operating room after undergoing a regional anesthetic.  After adequate levels anesthesia were obtained a thigh tourniquet was placed and the lower extremity was prepped using DuraPrep draped into a sterile field. The foot was draped out of the sterile field with impervious stockinette.  A timeout was called and the tourniquet inflated.  A transverse skin incision was made 12 cm distal to the tibial tubercle, the incision curved proximally, and a large posterior flap was created.  The tibia was transected just proximal to the skin incision and beveled anteriorly.  The fibula was transected just proximal to the tibial incision.  The sciatic nerve was pulled cut and allowed to retract.  The vascular bundles were suture ligated with 2-0 silk.  Kerecis micro powder was applied to the ends of the residual bone this was covered with a Kerecis sheet and stapled in place.  The tourniquet  was deflated and hemostasis obtained.  The deep and superficial fascial layers were closed using #1 Vicryl.  The skin was closed using staples.  The Prevena customizable dressing was applied this was overwrapped with the arthroform sponge.  Charlie Pitter was used to secure the sponges and the circumferential compression was secured to the skin with Dermatac.  This was connected to the wound VAC pump and had a good suction fit this was covered with a stump shrinker and a limb protector.  Patient was taken to the PACU in stable condition.   DISCHARGE PLANNING:  Antibiotic duration: 24-hour antibiotics  Weightbearing: Nonweightbearing on the operative extremity  Pain medication: Opioid pathway  Dressing care/ Wound VAC: Continue wound VAC with the Prevena plus pump at discharge for 1 week  Ambulatory devices: Walker or kneeling scooter  Discharge to: Discharge planning based on recommendations per physical therapy  Follow-up: In the office 1 week after discharge.

## 2022-05-23 NOTE — H&P (Addendum)
[name] Cruel Sr. is an 61 y.o. male.   Chief Complaint: Abscess osteomyelitis right foot. HPI: Patient is a 61 year old gentleman end-stage renal disease on dialysis Tuesday Thursday and Saturday with diabetes.  Patient has undergone multiple foot salvage intervention options with podiatry.  Despite foot salvage interventions patient has progressive wound dehiscence lateral right foot and radiographic evidence of osteomyelitis.  Patient states he is also had a stroke after the foot salvage surgery interventions involving the left side.  Past Medical History:  Diagnosis Date   CHF (congestive heart failure) ([place])    Diabetes mellitus without complication ([place])    type 2   ESRD on hemodialysis ([place])    mon wed fri   Hypertension    Kidney failure    Myocardial infarction ([place])    Nonischemic cardiomyopathy [facility])     Past Surgical History:  Procedure Laterality Date   A/V FISTULAGRAM N/A 08/15/2019   Procedure: A/V FISTULAGRAM;  Surgeon: [name], MD;  Location: [place] [facility];  Service: Cardiovascular;  Laterality: N/A;   AMPUTATION Right 02/02/2022   Procedure: AMPUTATION 5th RAY;  Surgeon: [name], MD;  Location: [place];  Service: Podiatry;  Laterality: Right;   AV FISTULA PLACEMENT     CARDIAC CATHETERIZATION  02/17/2018   LHC 02/17/18 ([name]): 30% pLAD, 30% dRCA, LVEDP 25-30, LVEF 25%.     CATARACT EXTRACTION W/PHACO Right 03/29/2020   Procedure: CATARACT EXTRACTION PHACO AND INTRAOCULAR LENS PLACEMENT ([place])  RIGHT DIABETIC;  Surgeon: [name], MD;  Location: [facility];  Service: Ophthalmology;  Laterality: Right;  Lot # [id number] H [place]: 02:30.9 AP% 9.5% CDE: 14.38   EYE SURGERY Right    cataract removed   I & D EXTREMITY Right 02/02/2022   Procedure: IRRIGATION AND DEBRIDEMENT RIGHT FOOT;  Surgeon: [name], MD;  Location: [place];  Service: Podiatry;  Laterality: Right;   INCISION AND DRAINAGE OF WOUND Right 02/08/2022    Procedure: IRRIGATION AND DEBRIDEMENT WOUND;  Surgeon: [name], DPM;  Location: [place];  Service: Podiatry;  Laterality: Right;  with removal of infected bone    INSERTION OF DIALYSIS CATHETER  08/13/2019   Procedure: Insertion Of Dialysis Catheter;  Surgeon: [name], MD;  Location: [place];  Service: Vascular;;   INSERTION OF DIALYSIS CATHETER Left 07/19/2020   Procedure: INSERTION OF DIALYSIS CATHETER;  Surgeon: [name], MD;  Location: [place];  Service: Vascular;  Laterality: Left;   IRRIGATION AND DEBRIDEMENT ABSCESS     PERIPHERAL VASCULAR BALLOON ANGIOPLASTY  08/15/2019   Procedure: PERIPHERAL VASCULAR BALLOON ANGIOPLASTY;  Surgeon: [name], MD;  Location: [place] [facility];  Service: Cardiovascular;;   PERIPHERAL VASCULAR BALLOON ANGIOPLASTY Left 02/06/2022   Procedure: PERIPHERAL VASCULAR BALLOON ANGIOPLASTY;  Surgeon: [name], MD;  Location: [place] [facility];  Service: Cardiovascular;  Laterality: Left;   REVISON OF ARTERIOVENOUS FISTULA Left 07/19/2020   Procedure: REVISON/PLICATION OF ARTERIOVENOUS FISTULA LEFT;  Surgeon: [name], MD;  Location: [facility] OR;  Service: Vascular;  Laterality: Left;   THROMBECTOMY AND REVISION OF ARTERIOVENTOUS (AV) GORETEX  GRAFT Left 08/13/2019   Procedure: THROMBECTOMY OF LEFT ARM ARTERIOVENTOUS (AV) FISTULA;  Surgeon: [name], MD;  [facility];  Service: Vascular;  Laterality: Left;   TOE AMPUTATION      Family History  Problem Relation Age of Onset   Hypertension Mother    Social History:  reports that he has never smoked. He has never used smokeless  tobacco. He reports that he does not currently use alcohol. He reports that he does not use drugs.  Allergies:  Allergies  Allergen Reactions   Sulfa Antibiotics Nausea And Vomiting    No medications prior to admission.    No results found for this or any previous visit (from the past 48 hour(s)). No results  found.  Review of Systems  All other systems reviewed and are negative.   There were no vitals taken for this visit. Physical Exam  Patient is alert, oriented, no adenopathy, well-dressed, normal affect, normal respiratory effort. On examination patient has a strong dopplerable dorsalis pedis pulse which is biphasic.  His most recent ankle-brachial indices 3 months ago showed triphasic flow with calcified arteries and no arterial insufficiency.  Examination patient has a large wound of the lateral aspect of the right foot with exudative drainage.  Radiographs show destructive changes of the fourth metatarsal.  Complete resection of the fifth metatarsal.  Patient has a 6 a hindfoot varus and supinated forefoot due to the loss of the peroneal tendon function.  Most recent hemoglobin A1c 12.2. Assessment/Plan 1. Subacute osteomyelitis, right ankle and foot ([place])       Plan: Discussed with the patient recommendation to proceed with a transtibial amputation.  Patient states he would like to proceed as soon as possible.  Anticipate he will return to skilled nursing.  Risks and benefits and postoperative course was discussed patient states he understands has no further questions.  [name], MD 05/23/2022, 6:44 AM

## 2022-05-23 NOTE — Progress Notes (Signed)
OOrthopedic Tech Progress Note Patient Details:  [name] [name] Sr. 1961/08/08 [id number]  Patient ID: [name] [name] Sr., male   DOB: Nov 15, 1961, 61 y.o.   MRN: [id number] Order placed with [facility] for shrinker.  [place] 05/23/2022, 12:12 PM

## 2022-05-23 NOTE — Consult Note (Addendum)
[facility] Renal Consultation Note    Indication for Consultation:  Management of ESRD/hemodialysis; anemia, hypertension/volume and secondary hyperparathyroidism   HPI: [name] BROTHERS Sr. is a 61 y.o. male with ESRD on HD, DM, HTN, CHF, chronic R foot osteomyelitis. Has attempted several interventions to salvage, now requiring surgical intervention. Underwent R BKA this am per Dr. Sharol Given. Labs Na 138, K 4.4, BUN 17, Cr 7.08. Hgb 11.6. Nephrology asked to see for dialysis needs.   Dialysis TTS at [facility]. Last dialysis was 7/6. Treatment shortened by 30 mins. Has been reaching his dry weight. Seen and examined in the PACU. He is alert, oriented. No complaints after surgery. Denies chest pain, dyspnea. He wants to know what time he will have dialysis tomorrow.   Past Medical History:  Diagnosis Date   CHF (congestive heart failure) ([place])    Diabetes mellitus without complication ([place])    type 2   ESRD on hemodialysis ([place])    mon wed fri   Hypertension    Kidney failure    Myocardial infarction ([place])    Nonischemic cardiomyopathy [facility])    Past Surgical History:  Procedure Laterality Date   A/V FISTULAGRAM N/A 08/15/2019   Procedure: A/V FISTULAGRAM;  Surgeon: [name], MD;  Location: [place] [facility];  Service: Cardiovascular;  Laterality: N/A;   AMPUTATION Right 02/02/2022   Procedure: AMPUTATION 5th RAY;  Surgeon: [name], MD;  Location: [place];  Service: Podiatry;  Laterality: Right;   AV FISTULA PLACEMENT     CARDIAC CATHETERIZATION  02/17/2018   LHC 02/17/18 ([name]): 30% pLAD, 30% dRCA, LVEDP 25-30, LVEF 25%.     CATARACT EXTRACTION W/PHACO Right 03/29/2020   Procedure: CATARACT EXTRACTION PHACO AND INTRAOCULAR LENS PLACEMENT ([place])  RIGHT DIABETIC;  Surgeon: [name], MD;  Location: [facility] OR[facility];  Service: Ophthalmology;  Laterality: Right;  Lot # [id number] H [place]: 02:30.9 AP% 9.5% CDE: 14.38   EYE  SURGERY Right    cataract removed   I & D EXTREMITY Right 02/02/2022   Procedure: IRRIGATION AND DEBRIDEMENT RIGHT FOOT;  Surgeon: [name], MD;  Location: [place];  Service: Podiatry;  Laterality: Right;   INCISION AND DRAINAGE OF WOUND Right 02/08/2022   Procedure: IRRIGATION AND DEBRIDEMENT WOUND;  Surgeon: [name], DPM;  Location: [place];  Service: Podiatry;  Laterality: Right;  with removal of infected bone    INSERTION OF DIALYSIS CATHETER  08/13/2019   Procedure: Insertion Of Dialysis Catheter;  Surgeon: [name], MD;  Location: [place];  Service: Vascular;;   INSERTION OF DIALYSIS CATHETER Left 07/19/2020   Procedure: INSERTION OF DIALYSIS CATHETER;  Surgeon: [name], MD;  Location: [place];  Service: Vascular;  Laterality: Left;   IRRIGATION AND DEBRIDEMENT ABSCESS     PERIPHERAL VASCULAR BALLOON ANGIOPLASTY  08/15/2019   Procedure: PERIPHERAL VASCULAR BALLOON ANGIOPLASTY;  Surgeon: [name], MD;  Location: [place] [facility];  Service: Cardiovascular;;   PERIPHERAL VASCULAR BALLOON ANGIOPLASTY Left 02/06/2022   Procedure: PERIPHERAL VASCULAR BALLOON ANGIOPLASTY;  Surgeon: [name], MD;  Location: [place] [facility];  Service: Cardiovascular;  Laterality: Left;   REVISON OF ARTERIOVENOUS FISTULA Left 07/19/2020   Procedure: REVISON/PLICATION OF ARTERIOVENOUS FISTULA LEFT;  Surgeon: [name], MD;  Location: [place];  Service: Vascular;  Laterality: Left;   THROMBECTOMY AND REVISION OF ARTERIOVENTOUS (AV) GORETEX  GRAFT Left 08/13/2019   Procedure: THROMBECTOMY OF LEFT ARM ARTERIOVENTOUS (AV) FISTULA;  Surgeon: Donnetta Hutching,  Arvilla Meres, MD;  Location: [facility] OR;  Service: Vascular;  Laterality: Left;   TOE AMPUTATION     Family History  Problem Relation Age of Onset   Hypertension Mother    Social History:  reports that he has never smoked. He has never used smokeless tobacco. He reports that he does not currently use alcohol. He  reports that he does not use drugs. Allergies  Allergen Reactions   Sulfa Antibiotics Nausea And Vomiting   Prior to Admission medications   Medication Sig Start Date End Date Taking? Authorizing Provider  amLODipine (NORVASC) 5 MG tablet Take 5 mg by mouth in the morning.   Yes [provider]  aspirin EC 81 MG EC tablet Take 1 tablet (81 mg total) by mouth daily. Swallow whole. 02/21/22  Yes Sheikh, Omair Latif, DO  ethyl chloride spray Apply 1 Application topically Every Tuesday,Thursday,and Saturday with dialysis.   Yes [provider]  famotidine (PEPCID) 20 MG tablet Take 20 mg by mouth at bedtime.   Yes [provider]  guaiFENesin (MUCINEX) 600 MG 12 hr tablet Take 1,200 mg by mouth 2 (two) times daily. Give 2 tablet by mouth every 12 hours for congestion for 30 days   Yes [provider]  HYDROcodone-acetaminophen (NORCO/VICODIN) 5-325 MG tablet Take 1 tablet by mouth every 4 (four) hours as needed for moderate pain. Give 1 tablet by mouth every 4 hours as needed for acute pain related to right foot wound for 30 days   Yes [provider]  insulin glargine (LANTUS) 100 UNIT/ML injection Inject 30 Units into the skin in the morning.   Yes [provider]  ipratropium-albuterol (DUONEB) 0.5-2.5 (3) MG/3ML SOLN Take 3 mLs by nebulization every 4 (four) hours as needed.   Yes [provider]  Multiple Vitamin (MULTIVITAMIN WITH MINERALS) TABS tablet Take 1 tablet by mouth every evening. (2100)   Yes [provider]  pantoprazole (PROTONIX) 40 MG tablet Take 1 tablet (40 mg total) by mouth daily at 12 noon. 02/21/22  Yes Sheikh, Omair Latif, DO  senna-docusate (SENOKOT-S) 8.6-50 MG tablet Take 1 tablet by mouth 2 (two) times daily. 02/20/22  Yes Sheikh, Omair Latif, DO  sucroferric oxyhydroxide (VELPHORO) 500 MG chewable tablet Chew 1,000 mg by mouth 2 (two) times daily. (0900 & 1700)   Yes [provider]  sucroferric  oxyhydroxide (VELPHORO) 500 MG chewable tablet Chew 1,000 mg by mouth See admin instructions. Give 2 tablets (1000 mg) by mouth one time a day every Mon, Wed, Fri, Sun with lunch   Yes [provider]  ceFEPIme 2 g in sodium chloride 0.9 % 100 mL Inject 2 g into the vein every Monday, Wednesday, and Friday at 8 PM. Patient taking differently: Inject 2 g into the vein. Being given with hemodialysis; Tuesday-Thursday-Saturday 02/21/22   Raiford Noble Latif, DO  multivitamin (RENA-VIT) TABS tablet Take 1 tablet by mouth at bedtime. Patient not taking: Reported on 05/22/2022 02/20/22   Raiford Noble Latif, DO  Nutritional Supplements (,FEEDING SUPPLEMENT, PROSOURCE PLUS) liquid Take 30 mLs by mouth 2 (two) times daily between meals. Patient not taking: Reported on 05/22/2022 02/21/22   Raiford Noble Latif, DO  Nutritional Supplements (FEEDING SUPPLEMENT, NEPRO CARB STEADY,) LIQD Take 237 mLs by mouth 2 (two) times daily between meals. Patient not taking: Reported on 05/22/2022 02/21/22   Kerney Elbe, DO   Current Facility-Administered Medications  Medication Dose Route Frequency Provider Last Rate Last Admin   0.9 %  sodium chloride infusion   Intravenous Continuous Janeece Riggers, MD   Stopped at 05/23/22 1022   acetaminophen (TYLENOL) tablet 325-650 mg  325-650 mg Oral Q4H PRN Janeece Riggers, MD       Or   acetaminophen (TYLENOL) 160 MG/5ML solution 325-650 mg  325-650 mg Oral Q4H PRN Janeece Riggers, MD       fentaNYL (SUBLIMAZE) injection 25-50 mcg  25-50 mcg Intravenous Q5 min PRN Janeece Riggers, MD       heparin injection 3,000 Units  3,000 Units Dialysis PRN Roney Jaffe, MD       insulin aspart (novoLOG) injection 0-7 Units  0-7 Units Subcutaneous Q2H PRN Janeece Riggers, MD   3 Units at 05/23/22 0825   meperidine (DEMEROL) injection 6.25-12.5 mg  6.25-12.5 mg Intravenous Q5 min PRN Janeece Riggers, MD       ondansetron [facility]) injection 4 mg  4 mg Intravenous Once PRN Janeece Riggers, MD        oxyCODONE (Oxy IR/ROXICODONE) immediate release tablet 5 mg  5 mg Oral Once PRN Janeece Riggers, MD       Or   oxyCODONE (ROXICODONE) 5 MG/5ML solution 5 mg  5 mg Oral Once PRN Janeece Riggers, MD         ROS: As per HPI otherwise negative.  Physical Exam: Vitals:   05/23/22 0930 05/23/22 0935 05/23/22 1030 05/23/22 1045  BP: (!) 148/76 132/72 121/73 127/68  Pulse: 90 86 83 84  Resp: 15 (!) 28 (!) 30 (!) 26  Temp:   (!) 97.2 F (36.2 C)   TempSrc:      SpO2: 97% 96% 93% 94%  Weight:      Height:         General: Appears comfortable, in no distress  Head: NCAT sclera not icteric MMM Neck: Supple. No JVD appreciated  Lungs: Clear bilaterally without wheezes, rales, or rhonchi.  Heart: RRR, no murmur, rub, or gallop  Abdomen: soft non-tender, bowel sounds normal, no masses  Lower extremities: L no sig edema; R BKA with wound vac in place Neuro: A & O X 3. Moves all extremities spontaneously. Psych:  Normal affect  Dialysis Access: LUE AVF +bruit   Labs: Basic Metabolic Panel: Recent Labs  Lab 05/23/22 0804 05/23/22 0825  NA 137 138  K 6.1* 4.4  CL 98 95*  CO2  --  28  GLUCOSE 228* 209*  BUN 24* 17  CREATININE 7.40* 7.08*  CALCIUM  --  9.7   Liver Function Tests: No results for input(s): "AST", "ALT", "ALKPHOS", "BILITOT", "PROT", "ALBUMIN" in the last 168 hours. No results for input(s): "LIPASE", "AMYLASE" in the last 168 hours. No results for input(s): "AMMONIA" in the last 168 hours. CBC: Recent Labs  Lab 05/23/22 0804  HGB 11.6*  HCT 34.0*   Cardiac Enzymes: No results for input(s): "CKTOTAL", "CKMB", "CKMBINDEX", "TROPONINI" in the last 168 hours. CBG: Recent Labs  Lab 05/23/22 0730 05/23/22 1032  GLUCAP 227* 162*   Iron Studies: No results for input(s): "IRON", "TIBC", "TRANSFERRIN", "FERRITIN" in the last 72 hours. Studies/Results: No results found.  Dialysis Orders:  Unit: [facility] TTS Time:  4:15  EDW: 112 kg  Flows: 450/500 Bath:  2K/2.5 Ca  Access: L AVF 15g  Heparin: 6000 U bolus ESA: Mircera 150  q 2 wks (last 6/29) VDRA: Hectorol 4 qHD   Assessment/Plan:  Right foot osteo.-- R BKA today per Dr. Sharol Given   ESRD -  HD TTS. Continue on schedule. Next HD  7/8. Hold heparin   Hypertension/volume  - Blood pressure controlled. No volume excess. Expects weights to drop s/p BKA.  Anemia  - Hgb 11.6. On ESA as outpatient. Follow trends.   Metabolic bone disease -  Ca in range. Check phos in the am. Continue home meds   Nutrition - Renal diet with fluid restriction  DM - insulin per primary   Lynnda Child PA-C [facility] 05/23/2022, 11:02 AM

## 2022-05-23 NOTE — Interval H&P Note (Signed)
History and Physical Interval Note:  05/23/2022 9:37 AM  [name] Cruel Sr.  has presented today for surgery, with the diagnosis of Ulceration and Osteomyelitis Right Foot.  The various methods of treatment have been discussed with the patient and family. After consideration of risks, benefits and other options for treatment, the patient has consented to  Procedure(s): RIGHT BELOW KNEE AMPUTATION (Right) APPLICATION OF WOUND VAC as a surgical intervention.  The patient's history has been reviewed, patient examined, no change in status, stable for surgery.  I have reviewed the patient's chart and labs.  Questions were answered to the patient's satisfaction.     [name]

## 2022-05-23 NOTE — Anesthesia Postprocedure Evaluation (Signed)
Anesthesia Post Note  Patient: [name] SHALER Sr.  Procedure(s) Performed: RIGHT BELOW KNEE AMPUTATION (Right: Knee) APPLICATION OF WOUND VAC     Patient location during evaluation: PACU Anesthesia Type: General and Regional Level of consciousness: awake and alert Pain management: pain level controlled Vital Signs Assessment: post-procedure vital signs reviewed and stable Respiratory status: spontaneous breathing, nonlabored ventilation, respiratory function stable and patient connected to nasal cannula oxygen Cardiovascular status: blood pressure returned to baseline and stable Postop Assessment: no apparent nausea or vomiting Anesthetic complications: no   No notable events documented.  Last Vitals:  Vitals:   05/23/22 1300 05/23/22 1330  BP: 131/68 132/68  Pulse: 84 82  Resp: 20 20  Temp:    SpO2: 97% 93%    Last Pain:  Vitals:   05/23/22 1300  TempSrc:   PainSc: 0-No pain                 Lisl Slingerland

## 2022-05-23 NOTE — Anesthesia Procedure Notes (Signed)
Anesthesia Regional Block: Popliteal block   Pre-Anesthetic Checklist: , timeout performed,  Correct Patient, Correct Site, Correct Laterality,  Correct Procedure, Correct Position, site marked,  Risks and benefits discussed,  Surgical consent,  Pre-op evaluation,  At surgeon's request and post-op pain management  Laterality: Right  Prep: chloraprep       Needles:  Injection technique: Single-shot  Needle Type: Echogenic Stimulator Needle     Needle Length: 5cm  Needle Gauge: 22     Additional Needles:   Procedures:, nerve stimulator,,, ultrasound used (permanent image in chart),,     Nerve Stimulator or Paresthesia:  Response: foot, 0.45 mA  Additional Responses:   Narrative:  Start time: 05/23/2022 9:40 AM End time: 05/23/2022 9:44 AM Injection made incrementally with aspirations every 5 mL.  Performed by: Personally  Anesthesiologist: Janeece Riggers, MD  Additional Notes: Functioning IV was confirmed and monitors were applied.  A 16mm 22ga Arrow echogenic stimulator needle was used. Sterile prep and drape,hand hygiene and sterile gloves were used. Ultrasound guidance: relevant anatomy identified, needle position confirmed, local anesthetic spread visualized around nerve(s)., vascular puncture avoided.  Image printed for medical record. Negative aspiration and negative test dose prior to incremental administration of local anesthetic. The patient tolerated the procedure well.

## 2022-05-23 NOTE — Progress Notes (Addendum)
Pt receives out-pt HD at [facility] on TTS. Contacted clinic to inquire about pt's chair time and awaiting response. Will assist as needed.  Melven Sartorius Renal Navigator [phone]  Addendum at 4:03 pm: Pt has an 11:30 chair time on TTS. Pt should arrive around 11:15.

## 2022-05-23 NOTE — Anesthesia Procedure Notes (Signed)
Anesthesia Regional Block: Adductor canal block   Pre-Anesthetic Checklist: , timeout performed,  Correct Patient, Correct Site, Correct Laterality,  Correct Procedure, Correct Position, site marked,  Risks and benefits discussed,  Surgical consent,  Pre-op evaluation,  At surgeon's request and post-op pain management  Laterality: Right  Prep: chloraprep       Needles:  Injection technique: Single-shot  Needle Type: Echogenic Stimulator Needle     Needle Length: 5cm  Needle Gauge: 22     Additional Needles:   Procedures:, nerve stimulator,,, ultrasound used (permanent image in chart),,    Narrative:  Start time: 05/23/2022 9:36 AM End time: 05/23/2022 9:40 AM Injection made incrementally with aspirations every 5 mL.  Performed by: Personally  Anesthesiologist: Janeece Riggers, MD  Additional Notes: Functioning IV was confirmed and monitors were applied.  A 20mm 22ga Arrow echogenic stimulator needle was used. Sterile prep and drape,hand hygiene and sterile gloves were used. Ultrasound guidance: relevant anatomy identified, needle position confirmed, local anesthetic spread visualized around nerve(s)., vascular puncture avoided.  Image printed for medical record. Negative aspiration and negative test dose prior to incremental administration of local anesthetic. The patient tolerated the procedure well.

## 2022-05-24 ENCOUNTER — Encounter (HOSPITAL_COMMUNITY): Payer: Self-pay | Admitting: Orthopedic Surgery

## 2022-05-24 DIAGNOSIS — D72829 Elevated white blood cell count, unspecified: Secondary | ICD-10-CM

## 2022-05-24 DIAGNOSIS — E1122 Type 2 diabetes mellitus with diabetic chronic kidney disease: Secondary | ICD-10-CM | POA: Diagnosis not present

## 2022-05-24 DIAGNOSIS — I2510 Atherosclerotic heart disease of native coronary artery without angina pectoris: Secondary | ICD-10-CM

## 2022-05-24 DIAGNOSIS — N186 End stage renal disease: Secondary | ICD-10-CM | POA: Diagnosis not present

## 2022-05-24 DIAGNOSIS — Z794 Long term (current) use of insulin: Secondary | ICD-10-CM | POA: Diagnosis not present

## 2022-05-24 DIAGNOSIS — Z992 Dependence on renal dialysis: Secondary | ICD-10-CM

## 2022-05-24 LAB — GLUCOSE, CAPILLARY
Glucose-Capillary: 200 mg/dL — ABNORMAL HIGH (ref 70–99)
Glucose-Capillary: 109 mg/dL — ABNORMAL HIGH (ref 70–99)
Glucose-Capillary: 137 mg/dL — ABNORMAL HIGH (ref 70–99)

## 2022-05-24 LAB — CBC
HCT: 27.1 % — ABNORMAL LOW (ref 39.0–52.0)
Hemoglobin: 8.3 g/dL — ABNORMAL LOW (ref 13.0–17.0)
MCH: 23.8 pg — ABNORMAL LOW (ref 26.0–34.0)
MCHC: 30.6 g/dL (ref 30.0–36.0)
MCV: 77.7 fL — ABNORMAL LOW (ref 80.0–100.0)
Platelets: 428 10*3/uL — ABNORMAL HIGH (ref 150–400)
RBC: 3.49 MIL/uL — ABNORMAL LOW (ref 4.22–5.81)
RDW: 21.2 % — ABNORMAL HIGH (ref 11.5–15.5)
WBC: 17.9 10*3/uL — ABNORMAL HIGH (ref 4.0–10.5)
nRBC: 0 % (ref 0.0–0.2)

## 2022-05-24 LAB — RENAL FUNCTION PANEL
Albumin: 2.2 g/dL — ABNORMAL LOW (ref 3.5–5.0)
Anion gap: 14 (ref 5–15)
BUN: 27 mg/dL — ABNORMAL HIGH (ref 6–20)
CO2: 24 mmol/L (ref 22–32)
Calcium: 9.1 mg/dL (ref 8.9–10.3)
Chloride: 97 mmol/L — ABNORMAL LOW (ref 98–111)
Creatinine, Ser: 9.17 mg/dL — ABNORMAL HIGH (ref 0.61–1.24)
GFR, Estimated: 6 mL/min — ABNORMAL LOW (ref >60)
Glucose, Bld: 208 mg/dL — ABNORMAL HIGH (ref 70–99)
Phosphorus: 4.9 mg/dL — ABNORMAL HIGH (ref 2.5–4.6)
Potassium: 5 mmol/L (ref 3.5–5.1)
Sodium: 135 mmol/L (ref 135–145)

## 2022-05-24 MED ORDER — LIDOCAINE-PRILOCAINE 2.5-2.5 % EX CREA
TOPICAL_CREAM | CUTANEOUS | Status: DC
Start: 2022-05-24 — End: 2022-05-26

## 2022-05-24 NOTE — Assessment & Plan Note (Signed)
Systolic blood pressure 483 to 129 mmHg. Continue blood pressure control with amlodipine 5 mg.

## 2022-05-24 NOTE — Procedures (Signed)
I was present at this dialysis session. I have reviewed the session itself and made appropriate changes.   Vital signs in last 24 hours:  Temp:  [97.2 F (36.2 C)-100 F (37.8 C)] 99.1 F (37.3 C) (07/08 0839) Pulse Rate:  [81-98] 87 (07/08 0930) Resp:  [10-30] 20 (07/08 0930) BP: (118-159)/(68-82) 118/68 (07/08 0930) SpO2:  [93 %-100 %] 97 % (07/08 0930) Weight:  [115.8 kg] 115.8 kg (07/08 0839) Weight change:  Filed Weights   05/23/22 0729 05/24/22 0839  Weight: 104 kg 115.8 kg    Recent Labs  Lab 05/24/22 0735  NA 135  K 5.0  CL 97*  CO2 24  GLUCOSE 208*  BUN 27*  CREATININE 9.17*  CALCIUM 9.1  PHOS 4.9*    Recent Labs  Lab 05/23/22 0804 05/24/22 0735  WBC  --  17.9*  HGB 11.6* 8.3*  HCT 34.0* 27.1*  MCV  --  77.7*  PLT  --  428*    Scheduled Meds:  amLODipine  5 mg Oral Daily   vitamin C  1,000 mg Oral Daily   aspirin EC  81 mg Oral Daily   Chlorhexidine Gluconate Cloth  6 each Topical Q0600   docusate sodium  100 mg Oral Daily   insulin aspart  0-9 Units Subcutaneous TID WC   insulin aspart  2 Units Subcutaneous TID WC   insulin glargine-yfgn  20 Units Subcutaneous QHS   nutrition supplement (JUVEN)  1 packet Oral BID BM   pantoprazole  40 mg Oral Q1200   sucroferric oxyhydroxide  1,000 mg Oral BID WC   [START ON 05/25/2022] sucroferric oxyhydroxide  1,000 mg Oral Once per day on Sun Mon Wed Fri   zinc sulfate  220 mg Oral Daily   Continuous Infusions:  sodium chloride 10 mL/hr at 05/23/22 1659   anticoagulant sodium citrate     magnesium sulfate bolus IVPB     PRN Meds:.acetaminophen, alteplase, alum & mag hydroxide-simeth, anticoagulant sodium citrate, bisacodyl, guaiFENesin-dextromethorphan, heparin, heparin, hydrALAZINE, HYDROmorphone (DILAUDID) injection, ipratropium-albuterol, labetalol, lidocaine (PF), lidocaine-prilocaine, magnesium sulfate bolus IVPB, metoprolol tartrate, ondansetron, oxyCODONE, oxyCODONE, pentafluoroprop-tetrafluoroeth,  phenol, polyethylene glycol, potassium chloride   Donetta Potts,  MD 05/24/2022, 9:45 AM

## 2022-05-24 NOTE — Progress Notes (Signed)
Patient ID: [name] Cruel Sr., male   DOB: 05/31/1961, 61 y.o.   MRN: [id number] Patient is postoperative day 1 right transtibial amputation.  There is no drainage in the wound VAC canister there is a good suction fit.  Anticipate discharge back to skilled nursing.

## 2022-05-24 NOTE — Assessment & Plan Note (Addendum)
HD on TTsat. Had dialysis on 7/8  Nephrology consulted and following with dialysis, appreciate assistance

## 2022-05-24 NOTE — Plan of Care (Signed)
  Problem: Education: Goal: Ability to describe self-care measures that may prevent or decrease complications (Diabetes Survival Skills Education) will improve Outcome: Progressing   Problem: Coping: Goal: Ability to adjust to condition or change in health will improve Outcome: Progressing   Problem: Fluid Volume: Goal: Ability to maintain a balanced intake and output will improve Outcome: Progressing   

## 2022-05-24 NOTE — Assessment & Plan Note (Signed)
-  MRI brain 3/22 with numerous punctate acute infarctions throughout both cerebral hemispheres, primarily affecting the deep brain -continue ASA, no statin started as LDL to goal

## 2022-05-24 NOTE — Assessment & Plan Note (Signed)
Reactive vs. Secondary to subacute OM S/p BKA would expect to trend down No fever or other criteria for sepsis Continue to trend and monitor fever curve

## 2022-05-24 NOTE — Consult Note (Signed)
Initial Consultation Note   Patient: [name] HIETALA Sr. DXA:[id number] DOB: [birth year] PCP: [facility] DOA: 05/23/2022 DOS: the patient was seen and examined on 05/24/2022 Primary service: [name], MD  Referring physician: Dr. Sharol Given Reason for consult: medical management   Assessment/Plan: Assessment and Plan: Below-knee amputation of right lower extremity ([place]) 61 year old male with hx of uncontrolled T2DM, ESRD, PVD who is POD #1 for right BKA secondary to subacute OM performed by Dr. Sharol Given -plan per ortho -PT/OT, awaiting rehab -pain management per ortho  -VTE per ortho   Leukocytosis Reactive vs. Secondary to subacute OM S/p BKA would expect to trend down No fever or other criteria for sepsis Continue to trend and monitor fever curve   acute on chronic Anemia of chronic renal failure  Baseline appears to be around 11-12 Pod #1 and hgb of 8.3 down from 11.6 Continue to monitor and transfuse if hgb <8 with cardiac  History   DM2 (diabetes mellitus, type 2) ([place]) Recent A1C in 01/2022 of 12.2, will update to see if improved at all Continue home insulin of 20 units long acting insulin Scheduled 2 units before meals and SSI QAC/HS  ESRD (end stage renal disease) ([place]) HD on TTsat. Had dialysis on 7/8  Nephrology consulted and following with dialysis, appreciate assistance   HTN (hypertension) Acceptable pressures Continue home medication of norvasc 5mg  daily  Hydralazine prn for elevated readings >209  Chronic systolic CHF (congestive heart failure) ([place]) Echo 01/2022: EF of 45-50%. LVF with  mildly decreased function and global hypokinesis. Diastolic could not be evaluated. Mild aortic stenosis  Volume control per HD.   History of CVA  -MRI brain 3/22 with numerous punctate acute infarctions throughout both cerebral hemispheres, primarily affecting the deep brain -continue ASA, no statin started as LDL to goal   CAD (coronary artery  disease) LHC in 2019 (performed in [place], [place]) showing nonobstructive CAD On no statin as LDL to goal per notes Stable   GERD (gastroesophageal reflux disease) Continue protonix   Hyperlipidemia, unspecified-resolved as of 05/24/2022 No stat     TR[facility] will continue to follow the patient.  HPI: [name] ESCHER Sr. is a 61 y.o. male with past medical history of T2DM, CHF, ESRD on diadlysis, hx of a CVA, hypertension, CAD, nonischemic cardiomyopathy who is s/p POD #1 for a right transtibial amputation by Dr. Sharol Given. He will be here for a few days awaiting SNF placement. Nephrology has been consulted and he had a HD session today. [facility] asked to consult for medical management.   He is resting in bed. Has some pain post op, but otherwise doing okay. Denies any chest pain, palpitations, shortness of breath, cough, stomach pain, N/V/d.   Review of Systems: As mentioned in the history of present illness. All other systems reviewed and are negative. Past Medical History:  Diagnosis Date   CHF (congestive heart failure) ([place])    Diabetes mellitus without complication ([place])    type 2   ESRD on hemodialysis ([place])    mon wed fri   Hypertension    Kidney failure    Myocardial infarction ([place])    Nonischemic cardiomyopathy [facility])    Past Surgical History:  Procedure Laterality Date   A/V FISTULAGRAM N/A 08/15/2019   Procedure: A/V FISTULAGRAM;  Surgeon: [name], MD;  Location: [place] [facility];  Service: Cardiovascular;  Laterality: N/A;   AMPUTATION Right 02/02/2022   Procedure: AMPUTATION 5th RAY;  Surgeon: [name], MD;  Location: [place];  Service: Podiatry;  Laterality: Right;   AMPUTATION Right 05/23/2022   Procedure: RIGHT BELOW KNEE AMPUTATION;  Surgeon: [name], MD;  Location: [place];  Service: Orthopedics;  Laterality: Right;   APPLICATION OF WOUND VAC  05/23/2022   Procedure: APPLICATION OF WOUND VAC;  Surgeon: [name], MD;  Location: [place];  Service:  Orthopedics;;   AV FISTULA PLACEMENT     CARDIAC CATHETERIZATION  02/17/2018   LHC 02/17/18 ([name]): 30% pLAD, 30% dRCA, LVEDP 25-30, LVEF 25%.     CATARACT EXTRACTION W/PHACO Right 03/29/2020   Procedure: CATARACT EXTRACTION PHACO AND INTRAOCULAR LENS PLACEMENT ([place])  RIGHT DIABETIC;  Surgeon: [name], MD;  Location: [facility];  Service: Ophthalmology;  Laterality: Right;  Lot # [id number] H [place]: 02:30.9 AP% 9.5% CDE: 14.38   EYE SURGERY Right    cataract removed   I & D EXTREMITY Right 02/02/2022   Procedure: IRRIGATION AND DEBRIDEMENT RIGHT FOOT;  Surgeon: [name], MD;  Location: [place];  Service: Podiatry;  Laterality: Right;   INCISION AND DRAINAGE OF WOUND Right 02/08/2022   Procedure: IRRIGATION AND DEBRIDEMENT WOUND;  Surgeon: [name], DPM;  Location: [place];  Service: Podiatry;  Laterality: Right;  with removal of infected bone    INSERTION OF DIALYSIS CATHETER  08/13/2019   Procedure: Insertion Of Dialysis Catheter;  Surgeon: [name], MD;  Location: [place];  Service: Vascular;;   INSERTION OF DIALYSIS CATHETER Left 07/19/2020   Procedure: INSERTION OF DIALYSIS CATHETER;  Surgeon: [name], MD;  Location: [place];  Service: Vascular;  Laterality: Left;   IRRIGATION AND DEBRIDEMENT ABSCESS     PERIPHERAL VASCULAR BALLOON ANGIOPLASTY  08/15/2019   Procedure: PERIPHERAL VASCULAR BALLOON ANGIOPLASTY;  Surgeon: [name], MD;  Location: [place] [facility];  Service: Cardiovascular;;   PERIPHERAL VASCULAR BALLOON ANGIOPLASTY Left 02/06/2022   Procedure: PERIPHERAL VASCULAR BALLOON ANGIOPLASTY;  Surgeon: [name], MD;  Location: [place] [facility];  Service: Cardiovascular;  Laterality: Left;   REVISON OF ARTERIOVENOUS FISTULA Left 07/19/2020   Procedure: REVISON/PLICATION OF ARTERIOVENOUS FISTULA LEFT;  Surgeon: [name], MD;  [facility];  Service: Vascular;  Laterality: Left;   THROMBECTOMY AND  REVISION OF ARTERIOVENTOUS (AV) GORETEX  GRAFT Left 08/13/2019   Procedure: THROMBECTOMY OF LEFT ARM ARTERIOVENTOUS (AV) FISTULA;  Surgeon: [name], MD;  Location: [place];  Service: Vascular;  Laterality: Left;   TOE AMPUTATION     Social History:  reports that he has never smoked. He has never used smokeless tobacco. He reports that he does not currently use alcohol. He reports that he does not use drugs.  Allergies  Allergen Reactions   Sulfa Antibiotics Nausea And Vomiting    Family History  Problem Relation Age of Onset   Hypertension Mother     Prior to Admission medications   Medication Sig Start Date End Date Taking? Authorizing Provider  amLODipine (NORVASC) 5 MG tablet Take 5 mg by mouth in the morning.   Yes [provider]  aspirin EC 81 MG EC tablet Take 1 tablet (81 mg total) by mouth daily. Swallow whole. 02/21/22  Yes Sheikh, Omair Latif, DO  ethyl chloride spray Apply 1 Application topically Every Tuesday,Thursday,and Saturday with dialysis.   Yes [provider]  famotidine (PEPCID) 20 MG tablet Take 20 mg by mouth at bedtime.   Yes [provider]  guaiFENesin (MUCINEX) 600 MG  12 hr tablet Take 1,200 mg by mouth 2 (two) times daily. Give 2 tablet by mouth every 12 hours for congestion for 30 days   Yes [provider]  HYDROcodone-acetaminophen (NORCO/VICODIN) 5-325 MG tablet Take 1 tablet by mouth every 4 (four) hours as needed for moderate pain. Give 1 tablet by mouth every 4 hours as needed for acute pain related to right foot wound for 30 days   Yes [provider]  insulin glargine (LANTUS) 100 UNIT/ML injection Inject 30 Units into the skin in the morning.   Yes [provider]  ipratropium-albuterol (DUONEB) 0.5-2.5 (3) MG/3ML SOLN Take 3 mLs by nebulization every 4 (four) hours as needed.   Yes [provider]  Multiple Vitamin (MULTIVITAMIN WITH MINERALS) TABS tablet Take 1 tablet by mouth every  evening. (2100)   Yes [provider]  pantoprazole (PROTONIX) 40 MG tablet Take 1 tablet (40 mg total) by mouth daily at 12 noon. 02/21/22  Yes Sheikh, Omair Latif, DO  senna-docusate (SENOKOT-S) 8.6-50 MG tablet Take 1 tablet by mouth 2 (two) times daily. 02/20/22  Yes Sheikh, Omair Latif, DO  sucroferric oxyhydroxide (VELPHORO) 500 MG chewable tablet Chew 1,000 mg by mouth 2 (two) times daily. (0900 & 1700)   Yes [provider]  sucroferric oxyhydroxide (VELPHORO) 500 MG chewable tablet Chew 1,000 mg by mouth See admin instructions. Give 2 tablets (1000 mg) by mouth one time a day every Mon, Wed, Fri, Sun with lunch   Yes [provider]  ceFEPIme 2 g in sodium chloride 0.9 % 100 mL Inject 2 g into the vein every Monday, Wednesday, and Friday at 8 PM. Patient taking differently: Inject 2 g into the vein. Being given with hemodialysis; Tuesday-Thursday-Saturday 02/21/22   Raiford Noble Latif, DO  multivitamin (RENA-VIT) TABS tablet Take 1 tablet by mouth at bedtime. Patient not taking: Reported on 05/22/2022 02/20/22   Raiford Noble Latif, DO  Nutritional Supplements (,FEEDING SUPPLEMENT, PROSOURCE PLUS) liquid Take 30 mLs by mouth 2 (two) times daily between meals. Patient not taking: Reported on 05/22/2022 02/21/22   Raiford Noble Latif, DO  Nutritional Supplements (FEEDING SUPPLEMENT, NEPRO CARB STEADY,) LIQD Take 237 mLs by mouth 2 (two) times daily between meals. Patient not taking: Reported on 05/22/2022 02/21/22   Kerney Elbe, DO    Physical Exam: Vitals:   05/24/22 1330 05/24/22 1353 05/24/22 1408 05/24/22 1515  BP: (!) 155/81 (!) 150/76 (!) 147/69 (!) 148/78  Pulse: 91 94 94 96  Resp: (!) 27 (!) 22 20 20   Temp:   98.3 F (36.8 C) 98.5 F (36.9 C)  TempSrc:   Oral Oral  SpO2: 90% 97% 96% 98%  Weight:  113 kg 113 kg   Height:       Physical Exam Vitals reviewed.  Constitutional:      General: He is not in acute distress.    Appearance: Normal appearance. He  is normal weight. He is not ill-appearing.  HENT:     Head: Normocephalic and atraumatic.     Nose: Nose normal.  Eyes:     Extraocular Movements: Extraocular movements intact.     Conjunctiva/sclera: Conjunctivae normal.     Comments: Pinpoint pupils  Cardiovascular:     Rate and Rhythm: Normal rate and regular rhythm.  Pulmonary:     Effort: Pulmonary effort is normal.     Breath sounds: Normal breath sounds.  Abdominal:     General: Bowel sounds are normal.  Palpations: Abdomen is soft.  Musculoskeletal:     Cervical back: Normal range of motion and neck supple.     Comments: Right BKA in brace   Skin:    General: Skin is warm.     Comments: Left BC fistula  Neurological:     General: No focal deficit present.     Mental Status: He is alert and oriented to person, place, and time.     Sensory: Sensory deficit (left foot, baseline) present.  Psychiatric:        Mood and Affect: Mood normal.        Behavior: Behavior normal.     Data Reviewed:  Wbc: 17.9 Hgb: 8.3 (from 11.6)  BN: 27 Creatinine: 9.17   Family Communication: none  Primary team communication: none  Thank you very much for involving [place] in the care of your patient.  Author: Orma Flaming, MD 05/24/2022 7:06 PM  For on call review [email or web address].

## 2022-05-24 NOTE — Assessment & Plan Note (Signed)
Continue protonix  

## 2022-05-24 NOTE — Assessment & Plan Note (Signed)
Recent A1C in 01/2022 of 12.2, will update to see if improved at all Continue home insulin of 20 units long acting insulin Scheduled 2 units before meals and SSI QAC/HS  Glucose has been stable with fasting glucose this am 121 mg/dl  Capillary 137 and 109

## 2022-05-24 NOTE — Progress Notes (Signed)
OT Cancellation Note  Patient Details Name: [name] MAGALLON Sr. MRN: [id number] DOB: 02-21-1961   Cancelled Treatment:    Reason Eval/Treat Not Completed: Patient at procedure or test/ unavailable Off unit for HD this AM. Will follow up for OT eval as schedule permits.   Layla Maw 05/24/2022, 8:53 AM

## 2022-05-24 NOTE — Assessment & Plan Note (Signed)
No stat

## 2022-05-24 NOTE — Progress Notes (Signed)
PT Cancellation Note  Patient Details Name: [name] [name] Sr. MRN: [id number] DOB: [birth year]   Cancelled Treatment:    Reason Eval/Treat Not Completed: Patient at procedure or test/unavailable. Pt off floor at HD treatment. Will plan to follow-up later as time permits.   Moishe Spice, PT, DPT Acute Rehabilitation Services  Office: [place] 05/24/2022, 11:57 AM

## 2022-05-24 NOTE — Assessment & Plan Note (Signed)
Baseline appears to be around 11-12 Pod #1 and hgb of 8.3 down from 11.6 Continue to monitor and transfuse if hgb <8 with cardiac  History

## 2022-05-24 NOTE — Assessment & Plan Note (Addendum)
61 year old male with hx of uncontrolled T2DM, ESRD, PVD who is POD #1 for right BKA secondary to subacute OM performed by Dr. Sharol Given -plan per ortho -PT/OT, awaiting rehab -pain management per ortho  -VTE per ortho

## 2022-05-24 NOTE — Assessment & Plan Note (Signed)
LHC in 2019 (performed in [place], [place]) showing nonobstructive CAD On no statin as LDL to goal per notes Stable

## 2022-05-24 NOTE — Assessment & Plan Note (Signed)
Echo 01/2022: EF of 45-50%. LVF with  mildly decreased function and global hypokinesis. Diastolic could not be evaluated. Mild aortic stenosis  Volume control per HD.

## 2022-05-25 DIAGNOSIS — I10 Essential (primary) hypertension: Secondary | ICD-10-CM

## 2022-05-25 DIAGNOSIS — S88111A Complete traumatic amputation at level between knee and ankle, right lower leg, initial encounter: Secondary | ICD-10-CM | POA: Diagnosis not present

## 2022-05-25 DIAGNOSIS — E1122 Type 2 diabetes mellitus with diabetic chronic kidney disease: Secondary | ICD-10-CM | POA: Diagnosis not present

## 2022-05-25 DIAGNOSIS — I5022 Chronic systolic (congestive) heart failure: Secondary | ICD-10-CM

## 2022-05-25 LAB — HEMOGLOBIN A1C
Hgb A1c MFr Bld: 8.2 % — ABNORMAL HIGH (ref 4.8–5.6)
Mean Plasma Glucose: 188.64 mg/dL

## 2022-05-25 LAB — CBC
HCT: 26.1 % — ABNORMAL LOW (ref 39.0–52.0)
Hemoglobin: 8.1 g/dL — ABNORMAL LOW (ref 13.0–17.0)
MCH: 24.2 pg — ABNORMAL LOW (ref 26.0–34.0)
MCHC: 31 g/dL (ref 30.0–36.0)
MCV: 77.9 fL — ABNORMAL LOW (ref 80.0–100.0)
Platelets: 394 10*3/uL (ref 150–400)
RBC: 3.35 MIL/uL — ABNORMAL LOW (ref 4.22–5.81)
RDW: 21.2 % — ABNORMAL HIGH (ref 11.5–15.5)
WBC: 16.4 10*3/uL — ABNORMAL HIGH (ref 4.0–10.5)
nRBC: 0 % (ref 0.0–0.2)

## 2022-05-25 LAB — GLUCOSE, CAPILLARY
Glucose-Capillary: 79 mg/dL (ref 70–99)
Glucose-Capillary: 116 mg/dL — ABNORMAL HIGH (ref 70–99)
Glucose-Capillary: 102 mg/dL — ABNORMAL HIGH (ref 70–99)
Glucose-Capillary: 100 mg/dL — ABNORMAL HIGH (ref 70–99)

## 2022-05-25 LAB — BASIC METABOLIC PANEL
Anion gap: 9 (ref 5–15)
BUN: 19 mg/dL (ref 6–20)
CO2: 30 mmol/L (ref 22–32)
Calcium: 8.9 mg/dL (ref 8.9–10.3)
Chloride: 95 mmol/L — ABNORMAL LOW (ref 98–111)
Creatinine, Ser: 6.47 mg/dL — ABNORMAL HIGH (ref 0.61–1.24)
GFR, Estimated: 9 mL/min — ABNORMAL LOW (ref >60)
Glucose, Bld: 121 mg/dL — ABNORMAL HIGH (ref 70–99)
Potassium: 4.6 mmol/L (ref 3.5–5.1)
Sodium: 134 mmol/L — ABNORMAL LOW (ref 135–145)

## 2022-05-25 LAB — HEPATITIS B SURFACE ANTIBODY, QUANTITATIVE: Hep B S AB Quant (Post): 363.6 m[IU]/mL (ref >9.9)

## 2022-05-25 LAB — TSH: TSH: 3.922 u[IU]/mL (ref 0.350–4.500)

## 2022-05-25 MED ORDER — DIPHENHYDRAMINE HCL 25 MG PO CAPS
25.0000 mg | ORAL_CAPSULE | Freq: Four times a day (QID) | ORAL | Status: DC | PRN
Start: 2022-05-25 — End: 2022-05-26
  Administered 2022-05-25 (×2): 25 mg via ORAL
  Filled 2022-05-25 (×2): qty 1

## 2022-05-25 NOTE — Evaluation (Signed)
Physical Therapy Evaluation Patient Details Name: [name] NIX Sr. MRN: [id number] DOB: 1961/05/22 Today's Date: 05/25/2022  History of Present Illness  Pt is a 61 y.o. male who presented 05/22/22 for R BKA after multiple failed foot salvage intervention attempts. PMH: diabetes mellitus type 2, CHF, ESRD, hypertension, myocardial infarction, nonischemic cardiomyopathy, CVA.  Clinical Impression   Pt admitted with above diagnosis. PTA patient reports using w/c for mobility, completing transfers with supervision and having assist for ADLs.  From SNF and reports residing there for approx 6 months.  Patient admitted for above and presents with impaired cognition, generalized weakness, impaired balance and decreased activity tolerance.  He has a flat affect, requires increased time and cueing to process, sequence and problem solve.  He requires mod assist +2 for bed mobility;  Educated on stump protector positioning.  Will benefit from continued PT service acutely and after dc at SNF level to optimize independence and decrease burden of care.  Pt currently with functional limitations due to the deficits listed below (see PT Problem List). Pt will benefit from skilled PT to increase their independence and safety with mobility to allow discharge to the venue listed below.          Recommendations for follow up therapy are one component of a multi-disciplinary discharge planning process, led by the attending physician.  Recommendations may be updated based on patient status, additional functional criteria and insurance authorization.  Follow Up Recommendations Skilled nursing-short term rehab (<3 hours/day) Can patient physically be transported by private vehicle: No    Assistance Recommended at Discharge Frequent or constant Supervision/Assistance  Patient can return home with the following       Equipment Recommendations BSC/3in1;Wheelchair (measurements PT);Wheelchair cushion (measurements  PT);Hospital bed (drop-arm BSC; drop-arm recliner)  Recommendations for Other Services       Functional Status Assessment Patient has had a recent decline in their functional status and demonstrates the ability to make significant improvements in function in a reasonable and predictable amount of time.     Precautions / Restrictions Precautions Precautions: Fall      Mobility  Bed Mobility Overal bed mobility: Needs Assistance Bed Mobility: Supine to Sit, Sit to Supine     Supine to sit: Mod assist, +2 for physical assistance, +2 for safety/equipment Sit to supine: Mod assist, +2 for safety/equipment   General bed mobility comments: trunk support to ascend and scooting    Transfers                   General transfer comment: deferred    Ambulation/Gait                  Stairs            Wheelchair Mobility    Modified Rankin (Stroke Patients Only)       Balance Overall balance assessment: Needs assistance   Sitting balance-Leahy Scale: Fair                                       Pertinent Vitals/Pain Pain Assessment Pain Assessment: Faces Faces Pain Scale: Hurts even more Pain Location: R BKA Pain Descriptors / Indicators: Discomfort, Grimacing, Guarding Pain Intervention(s): Limited activity within patient's tolerance, Monitored during session    Home Living Family/patient expects to be discharged to:: Skilled nursing facility  Additional Comments: pt reports for approx 6 mo; reports he was recieving PT/OT daily at SNF    Prior Function Prior Level of Function : Needs assist             Mobility Comments: reports having supervision for lateral scoot transfers to w/c, mainly using w/c ADLs Comments: needing some assist for ADLs     Hand Dominance        Extremity/Trunk Assessment   Upper Extremity Assessment Upper Extremity Assessment: Defer to OT evaluation    Lower Extremity  Assessment Lower Extremity Assessment: Generalized weakness;RLE deficits/detail RLE Deficits / Details: New BKA; able to flex and extend knee freely, though not quite gettign to full extension of the knee       Communication   Communication: Expressive difficulties  Cognition Arousal/Alertness: Awake/alert Behavior During Therapy: Flat affect Overall Cognitive Status: No family/caregiver present to determine baseline cognitive functioning Area of Impairment: Attention, Memory, Following commands, Safety/judgement, Awareness, Problem solving                   Current Attention Level: Sustained Memory: Decreased short-term memory Following Commands: Follows one step commands consistently, Follows one step commands with increased time Safety/Judgement: Decreased awareness of safety, Decreased awareness of deficits Awareness: Emergent Problem Solving: Slow processing, Difficulty sequencing, Requires verbal cues, Decreased initiation General Comments: pt with flat affect, slow processing and requires greatly increased time to intiate/seqeunce through basic tasks        General Comments General comments (skin integrity, edema, etc.): Very itchy during session; had received meds for pain and itching prior to session    Exercises     Assessment/Plan    PT Assessment Patient needs continued PT services  PT Problem List Decreased strength;Decreased range of motion;Decreased activity tolerance;Decreased balance;Decreased mobility;Decreased coordination;Decreased cognition;Decreased knowledge of use of DME;Decreased safety awareness;Decreased knowledge of precautions;Decreased skin integrity       PT Treatment Interventions DME instruction;Functional mobility training;Therapeutic activities;Therapeutic exercise;Balance training;Neuromuscular re-education;Cognitive remediation;Patient/family education;Wheelchair mobility training    PT Goals (Current goals can be found in the Care  Plan section)  Acute Rehab PT Goals Patient Stated Goal: to watch soemthing other than [employer] PT Goal Formulation: With patient Time For Goal Achievement: 06/08/22 Potential to Achieve Goals: Fair    Frequency Min 2X/week     Co-evaluation PT/OT/SLP Co-Evaluation/Treatment: Yes Reason for Co-Treatment: For patient/therapist safety;Necessary to address cognition/behavior during functional activity;To address functional/ADL transfers PT goals addressed during session: Mobility/safety with mobility         AM-PAC PT "6 Clicks" Mobility  Outcome Measure Help needed turning from your back to your side while in a flat bed without using bedrails?: A Little Help needed moving from lying on your back to sitting on the side of a flat bed without using bedrails?: Total Help needed moving to and from a bed to a chair (including a wheelchair)?: Total Help needed standing up from a chair using your arms (e.g., wheelchair or bedside chair)?: Total Help needed to walk in hospital room?: Total Help needed climbing 3-5 steps with a railing? : Total 6 Click Score: 8    End of Session Equipment Utilized During Treatment: Other (comment) (bed pad) Activity Tolerance: Patient tolerated treatment well Patient left: in bed;with call bell/phone within reach;with bed alarm set (bed in semi-chair position to eat) Nurse Communication: Mobility status PT Visit Diagnosis: Other abnormalities of gait and mobility (R26.89);Muscle weakness (generalized) (M62.81)    Time: 7628-3151 PT Time Calculation (min) (ACUTE ONLY):  35 min   Charges:   PT Evaluation $PT Eval Moderate Complexity: [place] [phone]   Colletta [place] 05/25/2022, 6:12 PM

## 2022-05-25 NOTE — Progress Notes (Signed)
Patient ID: [name] Cruel Sr., male   DOB: September 21, 1961, 61 y.o.   MRN: [id number] Postoperative day 2.  No acute changes over the last 24 hours.  Vital signs stable.  Acute blood loss anemia with hemoglobin of 8.1 (not significant change from yesterday).  We certainly appreciate the Family Medicine consult on this patient in helping manage any medical issues.  His below-knee amputation stump dressing and splint are intact with an incisional VAC with really no drainage.  The goal is return to skilled nursing earlier this week.

## 2022-05-25 NOTE — Progress Notes (Signed)
[facility] Progress Note   Subjective: Completed dialysis yesterday. Tolerated UF. Pain controlled. No complaints this am.   Objective Vitals:   05/24/22 2119 05/24/22 2130 05/25/22 0351 05/25/22 0841  BP: 111/61 111/61 (!) 128/57 123/68  Pulse: 90 90 86 86  Resp: 20 20 16 18   Temp: 99.7 F (37.6 C) 99.7 F (37.6 C) 99.5 F (37.5 C) 98.9 F (37.2 C)  TempSrc: Oral Oral Oral   SpO2: 95% 95% 97% 96%  Weight:      Height:          Additional Objective Labs: Basic Metabolic Panel: Recent Labs  Lab 05/23/22 0825 05/24/22 0735 05/25/22 0154  NA 138 135 134*  K 4.4 5.0 4.6  CL 95* 97* 95*  CO2 28 24 30   GLUCOSE 209* 208* 121*  BUN 17 27* 19  CREATININE 7.08* 9.17* 6.47*  CALCIUM 9.7 9.1 8.9  PHOS  --  4.9*  --    CBC: Recent Labs  Lab 05/23/22 0804 05/24/22 0735 05/25/22 0154  WBC  --  17.9* 16.4*  HGB 11.6* 8.3* 8.1*  HCT 34.0* 27.1* 26.1*  MCV  --  77.7* 77.9*  PLT  --  428* 394   Blood Culture    Component Value Date/Time   SDES BLOOD RIGHT HAND 02/07/2022 0648   SPECREQUEST  02/07/2022 0648    BOTTLES DRAWN AEROBIC AND ANAEROBIC Blood Culture results may not be optimal due to an inadequate volume of blood received in culture bottles   CULT  02/07/2022 0648    NO GROWTH 5 DAYS Performed at [facility], [place] [address] [place], [place] [address]    REPTSTATUS 02/12/2022 FINAL 02/07/2022 7867     Physical Exam General: Lying in bed, nad Heart: RRR Lungs: Clear  Abdomen: soft  Extremities: R BKA in stump guard  Dialysis Access: LUE AVF +bruit   Medications:  sodium chloride 10 mL/hr at 05/23/22 1659   magnesium sulfate bolus IVPB      amLODipine  5 mg Oral Daily   vitamin C  1,000 mg Oral Daily   aspirin EC  81 mg Oral Daily   Chlorhexidine Gluconate Cloth  6 each Topical Q0600   docusate sodium  100 mg Oral Daily   insulin aspart  0-9 Units Subcutaneous TID WC   insulin aspart  2 Units Subcutaneous TID WC    insulin glargine-yfgn  20 Units Subcutaneous QHS   lidocaine-prilocaine   Topical Q T,Th,Sa-HD   nutrition supplement (JUVEN)  1 packet Oral BID BM   pantoprazole  40 mg Oral Q1200   sucroferric oxyhydroxide  1,000 mg Oral BID WC   sucroferric oxyhydroxide  1,000 mg Oral Once per day on Sun Mon Wed Fri   zinc sulfate  220 mg Oral Daily    Dialysis Orders:  Unit: [facility] TTS Time:  4:15  EDW: 112 kg  Flows: 450/500 Bath: 2K/2.5 Ca  Access: L AVF 15g  Heparin: 6000 U bolus ESA: Mircera 150  q 2 wks (last 6/29) VDRA: Hectorol 4 qHD  Assessment/Plan: Right foot osteo.-- R BKA 7/7 -- per Dr. Sharol Given  ESRD -  HD TTS. Continue on schedule. Next HD 7/11. Hold heparin  Hypertension/volume  - Blood pressure controlled. No volume excess. Expects weights to drop s/p BKA. Anemia  - Hgb 11.6. > 8.1 post op  ESA due 7/13.  Follow trends. Transfuse prn  Metabolic bone disease -  Ca/Phos in range. Continue home meds  Nutrition -  Renal diet with fluid restriction  DM - insulin per primary  Prior CVA     Lynnda Child PA-C [facility] 05/25/2022,10:55 AM

## 2022-05-25 NOTE — Hospital Course (Signed)
Consult for medical management of diabetes mellitus, heart disease and hypertension during his hospitalization.   [name] [name] was admitted for right transtibial amputation performed on 05/23/22.  He has T2DM, heart failure, hypertension, CAD, hx CVA, ESRD on HD and non ischemic cardiomyopathy.

## 2022-05-25 NOTE — Plan of Care (Signed)
  Problem: Coping: Goal: Ability to adjust to condition or change in health will improve Outcome: Progressing   Problem: Fluid Volume: Goal: Ability to maintain a balanced intake and output will improve Outcome: Progressing   Problem: Health Behavior/Discharge Planning: Goal: Ability to manage health-related needs will improve Outcome: Progressing   

## 2022-05-25 NOTE — Progress Notes (Signed)
  Progress Note   Patient: [name] NANNEY Sr. VZD:[id number] DOB: November 18, 1960 DOA: 05/23/2022     2 DOS: the patient was seen and examined on 05/25/2022   Brief hospital course: Consult for medical management of diabetes mellitus, heart disease and hypertension during his hospitalization.   Mr. Apuzzo was admitted for right transtibial amputation performed on 05/23/22.  He has T2DM, heart failure, hypertension, CAD, hx CVA, ESRD on HD and non ischemic cardiomyopathy.   Assessment and Plan: Below-knee amputation of right lower extremity ([place]) 61 year old male with hx of uncontrolled T2DM, ESRD, PVD who is POD #1 for right BKA secondary to subacute OM performed by Dr. Sharol Given  Continue pain control and physical/occupational therapy.   Leukocytosis Reactive vs. Secondary to subacute OM S/p BKA would expect to trend down  Wbc is trending down, today is 16,4 No clinical signs of systemic infection, plan to continue follow up on cell count.   acute on chronic Anemia of chronic renal failure  Baseline appears to be around 11-12  Follow up hgb is 8,1 and plt 394 No current indication for PRBC transfusion.   DM2 (diabetes mellitus, type 2) ([place]) Recent A1C in 01/2022 of 12.2, will update to see if improved at all Continue home insulin of 20 units long acting insulin Scheduled 2 units before meals and SSI QAC/HS  Glucose has been stable with fasting glucose this am 121 mg/dl  Capillary 137 and 109   ESRD (end stage renal disease) ([place]) HD on TTsat.  Continue renal replacement therapy per nephrology recommendations.   HTN (hypertension) Systolic blood pressure 295 to 129 mmHg. Continue blood pressure control with amlodipine 5 mg.   Chronic systolic CHF (congestive heart failure) ([place]) Echo 01/2022: EF of 45-50%. LVF with  mildly decreased function and global hypokinesis. Diastolic could not be evaluated. Mild aortic stenosis  Volume control per HD.   History of CVA  -MRI brain 3/22 with  numerous punctate acute infarctions throughout both cerebral hemispheres, primarily affecting the deep brain -continue ASA, no statin started as LDL to goal   CAD (coronary artery disease) LHC in 2019 (performed in [place], [place]) showing nonobstructive CAD On no statin as LDL to goal per notes Stable   GERD (gastroesophageal reflux disease) Continue protonix         Subjective: Patient with no chest pain or dyspnea, pos operative pain is well controlled.,   Physical Exam: Vitals:   05/24/22 2130 05/25/22 0351 05/25/22 0841 05/25/22 1455  BP: 111/61 (!) 128/57 123/68 129/70  Pulse: 90 86 86 88  Resp: 20 16 18 16   Temp: 99.7 F (37.6 C) 99.5 F (37.5 C) 98.9 F (37.2 C) 99.1 F (37.3 C)  TempSrc: Oral Oral  Oral  SpO2: 95% 97% 96% 97%  Weight:      Height:       Neurology awake and alert ENT with mild pallor Cardiovascular with S1 and S2 present and rhythmic Respiratory with no rales or wheezing Abdomen not distended No lower extremity edema, right BKA  Data Reviewed:    Family Communication: no family at the bedside   Disposition: Status is: Inpatient Remains inpatient appropriate because: pending placement   Planned Discharge Destination: Skilled nursing facility     Author: Tawni Millers, MD 05/25/2022 5:11 PM  For on call review [email or web address].

## 2022-05-25 NOTE — Evaluation (Signed)
Occupational Therapy Evaluation Patient Details Name: [name] PITCOCK Sr. MRN: [id number] DOB: Mar 12, 1961 Today's Date: 05/25/2022   History of Present Illness Pt is a 61 y.o. male who presented 05/22/22 for R BKA after multiple failed foot salvage intervention attempts. PMH: diabetes mellitus type 2, CHF, ESRD, hypertension, myocardial infarction, nonischemic cardiomyopathy, CVA.   Clinical Impression   PTA patient reports using w/c for mobility, completing transfers with supervision and having assist for ADLs.  From SNF and reports residing there for approx 6 months.  Patient admitted for above and presents with impaired cognition, generalized weakness, impaired balance and decreased activity tolerance.  He has a flat affect, requires increased time and cueing to process, sequence and problem solve.  He requires mod assist +2 for bed mobility, min assist for UB ADLs and total assist for LB ADLs.  Educated on stump protector positioning.  Will benefit from continued OT service acutely and after dc at SNF level to optimize independence and decrease burden of care.       Recommendations for follow up therapy are one component of a multi-disciplinary discharge planning process, led by the attending physician.  Recommendations may be updated based on patient status, additional functional criteria and insurance authorization.   Follow Up Recommendations  Skilled nursing-short term rehab (<3 hours/day)    Assistance Recommended at Discharge Frequent or constant Supervision/Assistance  Patient can return home with the following A lot of help with walking and/or transfers;A lot of help with bathing/dressing/bathroom;Assistance with cooking/housework;Direct supervision/assist for medications management;Direct supervision/assist for financial management;Assist for transportation;Help with stairs or ramp for entrance    Functional Status Assessment  Patient has had a recent decline in their functional status  and demonstrates the ability to make significant improvements in function in a reasonable and predictable amount of time.  Equipment Recommendations  Other (comment) (defer)    Recommendations for Other Services       Precautions / Restrictions Precautions Precautions: Fall      Mobility Bed Mobility Overal bed mobility: Needs Assistance Bed Mobility: Supine to Sit, Sit to Supine     Supine to sit: Mod assist, +2 for physical assistance, +2 for safety/equipment Sit to supine: Mod assist, +2 for safety/equipment   General bed mobility comments: trunk support to ascend and scooting    Transfers                   General transfer comment: deferred      Balance                                           ADL either performed or assessed with clinical judgement   ADL Overall ADL's : Needs assistance/impaired     Grooming: Set up;Sitting           Upper Body Dressing : Sitting;Minimal assistance   Lower Body Dressing: Total assistance;+2 for physical assistance;+2 for safety/equipment;Sitting/lateral leans;Bed level     Toilet Transfer Details (indicate cue type and reason): deferred         Functional mobility during ADLs: Moderate assistance;+2 for physical assistance;+2 for safety/equipment General ADL Comments: limited to sitting EOB due to patient pain and tolerance     Vision   Vision Assessment?: No apparent visual deficits     Perception     Praxis      Pertinent Vitals/Pain Pain Assessment Pain Assessment: Faces Faces  Pain Scale: Hurts even more Pain Location: R BKA Pain Descriptors / Indicators: Discomfort, Grimacing, Guarding Pain Intervention(s): Limited activity within patient's tolerance, Monitored during session, Premedicated before session, Repositioned     Hand Dominance Right   Extremity/Trunk Assessment Upper Extremity Assessment Upper Extremity Assessment: Overall WFL for tasks assessed   Lower  Extremity Assessment Lower Extremity Assessment: Defer to PT evaluation       Communication Communication Communication: Expressive difficulties   Cognition Arousal/Alertness: Awake/alert Behavior During Therapy: Flat affect Overall Cognitive Status: No family/caregiver present to determine baseline cognitive functioning Area of Impairment: Attention, Memory, Following commands, Safety/judgement, Awareness, Problem solving                   Current Attention Level: Sustained Memory: Decreased short-term memory Following Commands: Follows one step commands consistently, Follows one step commands with increased time Safety/Judgement: Decreased awareness of safety, Decreased awareness of deficits Awareness: Emergent Problem Solving: Slow processing, Difficulty sequencing, Requires verbal cues, Decreased initiation General Comments: pt with flat affect, slow processing and requires greatly increased time to intiate/seqeunce through basic tasks     General Comments       Exercises     Shoulder Instructions      Home Living Family/patient expects to be discharged to:: Skilled nursing facility                                 Additional Comments: pt reports for apporx 6 mo      Prior Functioning/Environment Prior Level of Function : Needs assist             Mobility Comments: reports having supervision for lateral scoot transfers to w/c, mainly using w/c ADLs Comments: needing some assist for ADLs        OT Problem List: Decreased strength;Decreased activity tolerance;Impaired balance (sitting and/or standing);Decreased cognition;Decreased safety awareness;Decreased coordination;Decreased knowledge of use of DME or AE;Decreased knowledge of precautions;Obesity;Pain      OT Treatment/Interventions: Self-care/ADL training;Therapeutic exercise;DME and/or AE instruction;Therapeutic activities;Cognitive remediation/compensation;Patient/family  education;Balance training    OT Goals(Current goals can be found in the care plan section) Acute Rehab OT Goals Patient Stated Goal: less pain OT Goal Formulation: With patient Time For Goal Achievement: 06/08/22 Potential to Achieve Goals: Fair  OT Frequency: Min 2X/week    Co-evaluation PT/OT/SLP Co-Evaluation/Treatment: Yes Reason for Co-Treatment: For patient/therapist safety;To address functional/ADL transfers;Necessary to address cognition/behavior during functional activity   OT goals addressed during session: ADL's and self-care      AM-PAC OT "6 Clicks" Daily Activity     Outcome Measure Help from another person eating meals?: A Little Help from another person taking care of personal grooming?: A Little Help from another person toileting, which includes using toliet, bedpan, or urinal?: Total Help from another person bathing (including washing, rinsing, drying)?: A Lot Help from another person to put on and taking off regular upper body clothing?: A Little Help from another person to put on and taking off regular lower body clothing?: Total 6 Click Score: 13   End of Session Nurse Communication: Mobility status  Activity Tolerance: Patient tolerated treatment well Patient left: in bed;with call bell/phone within reach;with bed alarm set  OT Visit Diagnosis: Other abnormalities of gait and mobility (R26.89);Muscle weakness (generalized) (M62.81);Other symptoms and signs involving cognitive function;Pain Pain - Right/Left: Right Pain - part of body: Leg  Time: 1610-9604 OT Time Calculation (min): 34 min Charges:  OT General Charges $OT Visit: 1 Visit OT Evaluation $OT Eval Moderate Complexity: 1 Mod  [name] [name], OT Acute Rehabilitation Services Office [phone]   [name] [name] 05/25/2022, 3:01 PM

## 2022-05-26 ENCOUNTER — Telehealth: Payer: Self-pay | Admitting: Orthopedic Surgery

## 2022-05-26 DIAGNOSIS — S88111A Complete traumatic amputation at level between knee and ankle, right lower leg, initial encounter: Secondary | ICD-10-CM | POA: Diagnosis not present

## 2022-05-26 LAB — CBC WITH DIFFERENTIAL/PLATELET
Abs Immature Granulocytes: 0.13 10*3/uL — ABNORMAL HIGH (ref 0.00–0.07)
Basophils Absolute: 0.2 10*3/uL — ABNORMAL HIGH (ref 0.0–0.1)
Basophils Relative: 1 %
Eosinophils Absolute: 0.9 10*3/uL — ABNORMAL HIGH (ref 0.0–0.5)
Eosinophils Relative: 6 %
HCT: 24.6 % — ABNORMAL LOW (ref 39.0–52.0)
Hemoglobin: 7.8 g/dL — ABNORMAL LOW (ref 13.0–17.0)
Immature Granulocytes: 1 %
Lymphocytes Relative: 10 %
Lymphs Abs: 1.5 10*3/uL (ref 0.7–4.0)
MCH: 24.1 pg — ABNORMAL LOW (ref 26.0–34.0)
MCHC: 31.7 g/dL (ref 30.0–36.0)
MCV: 75.9 fL — ABNORMAL LOW (ref 80.0–100.0)
Monocytes Absolute: 2 10*3/uL — ABNORMAL HIGH (ref 0.1–1.0)
Monocytes Relative: 13 %
Neutro Abs: 10.4 10*3/uL — ABNORMAL HIGH (ref 1.7–7.7)
Neutrophils Relative %: 69 %
Platelets: 446 10*3/uL — ABNORMAL HIGH (ref 150–400)
RBC: 3.24 MIL/uL — ABNORMAL LOW (ref 4.22–5.81)
RDW: 20.9 % — ABNORMAL HIGH (ref 11.5–15.5)
WBC: 14.9 10*3/uL — ABNORMAL HIGH (ref 4.0–10.5)
nRBC: 0 % (ref 0.0–0.2)

## 2022-05-26 LAB — BASIC METABOLIC PANEL
Anion gap: 14 (ref 5–15)
BUN: 33 mg/dL — ABNORMAL HIGH (ref 6–20)
CO2: 27 mmol/L (ref 22–32)
Calcium: 8.9 mg/dL (ref 8.9–10.3)
Chloride: 92 mmol/L — ABNORMAL LOW (ref 98–111)
Creatinine, Ser: 8.51 mg/dL — ABNORMAL HIGH (ref 0.61–1.24)
GFR, Estimated: 7 mL/min — ABNORMAL LOW (ref >60)
Glucose, Bld: 96 mg/dL (ref 70–99)
Potassium: 4.4 mmol/L (ref 3.5–5.1)
Sodium: 133 mmol/L — ABNORMAL LOW (ref 135–145)

## 2022-05-26 LAB — SURGICAL PATHOLOGY

## 2022-05-26 LAB — GLUCOSE, CAPILLARY
Glucose-Capillary: 96 mg/dL (ref 70–99)
Glucose-Capillary: 93 mg/dL (ref 70–99)

## 2022-05-26 MED ORDER — PENTAFLUOROPROP-TETRAFLUOROETH EX AERO
1.0000 | INHALATION_SPRAY | CUTANEOUS | Status: DC | PRN
Start: 2022-05-26 — End: 2022-05-26

## 2022-05-26 MED ORDER — ALTEPLASE 2 MG IJ SOLR: 2.0000 mg | Freq: Once | INTRAMUSCULAR | Status: DC | PRN

## 2022-05-26 MED ORDER — LIDOCAINE-PRILOCAINE 2.5-2.5 % EX CREA: 1.0000 | TOPICAL_CREAM | CUTANEOUS | Status: DC | PRN

## 2022-05-26 MED ORDER — HEPARIN SODIUM (PORCINE) 1000 UNIT/ML DIALYSIS: 1000.0000 [IU] | INTRAMUSCULAR | Status: DC | PRN

## 2022-05-26 MED ORDER — LIDOCAINE HCL (PF) 1 % IJ SOLN: 5.0000 mL | INTRAMUSCULAR | Status: DC | PRN

## 2022-05-26 MED ORDER — OXYCODONE-ACETAMINOPHEN 5-325 MG PO TABS: 1.0000 | ORAL_TABLET | ORAL | 0 refills | Status: DC | PRN

## 2022-05-26 NOTE — Care Management Important Message (Signed)
Important Message  Patient Details  Name: [name] MATASSA Sr. MRN: [id number] Date of Birth: [birth year]   Medicare Important Message Given:  Yes   Patient left prior to IM delivery will mail to the patient home address.   Nikos Anglemyer 05/26/2022, 4:28 PM

## 2022-05-26 NOTE — NC FL2 (Signed)
[place] LEVEL OF CARE SCREENING TOOL     IDENTIFICATION  Patient Name: [name] [name] Sr. Birthdate: 1961-08-23 Sex: male Admission Date (Current Location): 05/23/2022  [place] and [place] Number:  [name] [name] [phone] [place] and Address:  [facility] [place], [place] [address], [place], [place] [address]      Provider Number: [id number]  Attending Physician Name and Address:  [name], MD  Relative Name and Phone Number:  [name], [name] [phone]    Current Level of Care: Hospital Recommended Level of Care: [place] Prior Approval Number:    Date Approved/Denied:   PASRR Number: [id number]  Discharge Plan: SNF    Current Diagnoses: Patient Active Problem List   Diagnosis Date Noted   CAD (coronary artery disease) 05/24/2022   Leukocytosis 05/24/2022   Below-knee amputation of right lower extremity ([place]) 05/23/2022   Obesity (BMI 30-39.9) 02/19/2022   History of CVA  02/05/2022   Carotid stenosis 02/05/2022   acute on chronic Anemia of chronic renal failure  02/03/2022   Subacute osteomyelitis, right ankle and foot ([place]) 02/72/5366   Acute metabolic encephalopathy 44/01/4741   Protein-calorie malnutrition, mild ([place]) 02/02/2022   GERD (gastroesophageal reflux disease) 59/56/3875   Chronic systolic CHF (congestive heart failure) ([place]) 08/14/2019   LBBB (left bundle branch block) 08/13/2019   Problem with vascular access 08/13/2019   Dialysis AV fistula malfunction ([place]) 08/12/2019   ESRD (end stage renal disease) ([place]) 09/20/2018   DM2 (diabetes mellitus, type 2) ([place]) 09/20/2018   HTN (hypertension) 09/20/2018    Orientation RESPIRATION BLADDER Height & Weight     Self, Time, Situation, Place  Normal Continent Weight: 249 lb 1.9 oz (113 kg) Height:  6\' 1"  (185.4 cm)  BEHAVIORAL SYMPTOMS/MOOD NEUROLOGICAL BOWEL NUTRITION STATUS      Continent Diet (see discharge summary)  AMBULATORY STATUS  COMMUNICATION OF NEEDS Skin   Total Care Verbally Normal                       Personal Care Assistance Level of Assistance  Bathing, Feeding, Dressing Bathing Assistance: Maximum assistance Feeding assistance: Limited assistance Dressing Assistance: Maximum assistance     Functional Limitations Info  Sight, Hearing, Speech Sight Info: Adequate Hearing Info: Adequate Speech Info: Adequate    SPECIAL CARE FACTORS FREQUENCY  PT (By licensed PT), OT (By licensed OT)     PT Frequency: 5x week OT Frequency: 5x week            Contractures Contractures Info: Not present    Additional Factors Info  Code Status, Allergies Code Status Info: full Allergies Info: sulfa antibiotics           Current Medications (05/26/2022):  This is the current hospital active medication list Current Facility-Administered Medications  Medication Dose Route Frequency Provider Last Rate Last Admin   0.9 %  sodium chloride infusion   Intravenous Continuous [name], MD 10 mL/hr at 05/23/22 1659 New Bag at 05/23/22 1659   acetaminophen (TYLENOL) tablet 325-650 mg  325-650 mg Oral Q6H PRN [name], MD   650 mg at 05/24/22 0755   alteplase (CATHFLO ACTIVASE) injection 2 mg  2 mg Intracatheter Once PRN Adelfa Koh, NP       alum & mag hydroxide-simeth (MAALOX/MYLANTA) 200-200-20 MG/5ML suspension 15-30 mL  15-30 mL Oral Q2H PRN [name], MD       amLODipine (NORVASC) tablet 5 mg  5 mg  Oral Daily [name], MD   5 mg at 05/26/22 7510   ascorbic acid (VITAMIN C) tablet 1,000 mg  1,000 mg Oral Daily [name], MD   1,000 mg at 05/26/22 2585   aspirin EC tablet 81 mg  81 mg Oral Daily [name], MD   81 mg at 05/26/22 2778   bisacodyl (DULCOLAX) EC tablet 5 mg  5 mg Oral Daily PRN [name], MD       Chlorhexidine Gluconate Cloth 2 % PADS 6 each  6 each Topical Q0600 Lynnda Child, PA-C   6 each at 05/26/22 2423   diphenhydrAMINE (BENADRYL)  capsule 25 mg  25 mg Oral Q6H PRN Tawni Millers, MD   25 mg at 05/25/22 2221   docusate sodium (COLACE) capsule 100 mg  100 mg Oral Daily [name], MD       guaiFENesin-dextromethorphan (ROBITUSSIN DM) 100-10 MG/5ML syrup 15 mL  15 mL Oral Q4H PRN [name], MD   15 mL at 05/25/22 0910   heparin injection 1,000 Units  1,000 Units Dialysis PRN Adelfa Koh, NP       hydrALAZINE (APRESOLINE) injection 5 mg  5 mg Intravenous Q20 Min PRN [name], MD       HYDROmorphone (DILAUDID) injection 0.5-1 mg  0.5-1 mg Intravenous Q4H PRN [name], MD   1 mg at 05/25/22 1226   insulin aspart (novoLOG) injection 0-9 Units  0-9 Units Subcutaneous TID WC [name], MD   2 Units at 05/24/22 0756   insulin aspart (novoLOG) injection 2 Units  2 Units Subcutaneous TID WC [name], MD   2 Units at 05/26/22 0902   insulin glargine-yfgn (SEMGLEE) injection 20 Units  20 Units Subcutaneous QHS [name], MD   20 Units at 05/25/22 2222   ipratropium-albuterol (DUONEB) 0.5-2.5 (3) MG/3ML nebulizer solution 3 mL  3 mL Nebulization Q4H PRN [name], MD       lidocaine (PF) (XYLOCAINE) 1 % injection 5 mL  5 mL Intradermal PRN Adelfa Koh, NP       lidocaine-prilocaine (EMLA) cream 1 Application  1 Application Topical PRN Adelfa Koh, NP       lidocaine-prilocaine (EMLA) cream   Topical Q T,Th,Sa-HD Donato Heinz, MD   Given at 05/24/22 1530   magnesium sulfate IVPB 2 g 50 mL  2 g Intravenous Daily PRN [name], MD       nutrition supplement (JUVEN) (JUVEN) powder packet 1 packet  1 packet Oral BID BM [name], MD   1 packet at 05/26/22 0905   ondansetron (ZOFRAN) injection 4 mg  4 mg Intravenous Q6H PRN [name], MD       oxyCODONE (Oxy IR/ROXICODONE) immediate release tablet 10-15 mg  10-15 mg Oral Q4H PRN [name], MD   15 mg at 05/25/22 1815   oxyCODONE (Oxy IR/ROXICODONE) immediate release tablet 5-10 mg  5-10 mg Oral  Q4H PRN [name], MD   10 mg at 05/24/22 1433   pantoprazole (PROTONIX) EC tablet 40 mg  40 mg Oral Q1200 [name], MD   40 mg at 05/25/22 1226   pentafluoroprop-tetrafluoroeth (GEBAUERS) aerosol 1 Application  1 Application Topical PRN Adelfa Koh, NP       phenol (CHLORASEPTIC) mouth spray 1 spray  1 spray Mouth/Throat PRN [name], MD  polyethylene glycol (MIRALAX / GLYCOLAX) packet 17 g  17 g Oral Daily PRN [name], MD       potassium chloride SA (KLOR-CON M) CR tablet 20-40 mEq  20-40 mEq Oral Daily PRN [name], MD       sucroferric oxyhydroxide [facility]) chewable tablet 1,000 mg  1,000 mg Oral BID WC [name], MD       sucroferric oxyhydroxide [facility]) chewable tablet 1,000 mg  1,000 mg Oral Once per day on Sun Mon Wed Fri [name], MD       zinc sulfate capsule 220 mg  220 mg Oral Daily [name], MD   220 mg at 05/26/22 5361     Discharge Medications: Please see discharge summary for a list of discharge medications.  Relevant Imaging Results:  Relevant Lab Results:   Additional Information SS#: [id number]; HD MWF at [facility] at 11:15  Joanne Chars, LCSW

## 2022-05-26 NOTE — Discharge Summary (Signed)
Discharge Diagnoses:  Active Problems:   ESRD (end stage renal disease) (HCC)   DM2 (diabetes mellitus, type 2) (HCC)   HTN (hypertension)   Chronic systolic CHF (congestive heart failure) (HCC)   Subacute osteomyelitis, right ankle and foot (HCC)   GERD (gastroesophageal reflux disease)   acute on chronic Anemia of chronic renal failure    History of CVA    Below-knee amputation of right lower extremity (HCC)   CAD (coronary artery disease)   Leukocytosis   Surgeries: Procedure(s): RIGHT BELOW KNEE AMPUTATION APPLICATION OF WOUND VAC on 05/23/2022    Consultants: Treatment Team:  Donato Heinz, MD Arrien, Jimmy Picket, MD  Discharged Condition: Improved  Hospital Course: [name] SMITHERMAN Sr. is an 61 y.o. male who was admitted 05/23/2022 with a chief complaint of right foot pain/infection and found to have a diagnosis of Ulceration and Osteomyelitis Right Foot.  They were brought to the operating room on 05/23/2022 and underwent Procedure(s): RIGHT BELOW KNEE AMPUTATION APPLICATION OF WOUND VAC.    They were given perioperative antibiotics:  Anti-infectives (From admission, onward)    Start     Dose/Rate Route Frequency Ordered Stop   05/23/22 1800  ceFAZolin (ANCEF) IVPB 2g/100 mL premix        2 g 200 mL/hr over 30 Minutes Intravenous Every 8 hours 05/23/22 1537 05/24/22 0305   05/23/22 0730  ceFAZolin (ANCEF) IVPB 2g/100 mL premix        2 g 200 mL/hr over 30 Minutes Intravenous On call to O.R. 05/23/22 1610 05/23/22 0956     .  They were given sequential compression devices, early ambulation, and Other (comment)asa for DVT prophylaxis.  Recent vital signs: Patient Vitals for the past 24 hrs:  BP Temp Temp src Pulse Resp SpO2  05/26/22 0513 135/65 98.5 F (36.9 C) -- 83 16 96 %  05/25/22 2000 113/64 98.4 F (36.9 C) Oral 87 17 92 %  05/25/22 1947 113/64 98.4 F (36.9 C) -- 87 17 92 %  05/25/22 1455 129/70 99.1 F (37.3 C) Oral 88 16 97 %  05/25/22 0841 123/68  98.9 F (37.2 C) -- 86 18 96 %  .  Recent laboratory studies: No results found.  Discharge Medications:   Allergies as of 05/26/2022       Reactions   Sulfa Antibiotics Nausea And Vomiting        Medication List     STOP taking these medications    HYDROcodone-acetaminophen 5-325 MG tablet Commonly known as: NORCO/VICODIN       TAKE these medications    (feeding supplement) PROSource Plus liquid Take 30 mLs by mouth 2 (two) times daily between meals.   feeding supplement (NEPRO CARB STEADY) Liqd Take 237 mLs by mouth 2 (two) times daily between meals.   amLODipine 5 MG tablet Commonly known as: NORVASC Take 5 mg by mouth in the morning.   aspirin EC 81 MG tablet Take 1 tablet (81 mg total) by mouth daily. Swallow whole.   ceFEPIme 2 g in sodium chloride 0.9 % 100 mL Inject 2 g into the vein every Monday, Wednesday, and Friday at 8 PM. What changed:  when to take this additional instructions   ethyl chloride spray Apply 1 Application topically Every Tuesday,Thursday,and Saturday with dialysis.   famotidine 20 MG tablet Commonly known as: PEPCID Take 20 mg by mouth at bedtime.   guaiFENesin 600 MG 12 hr tablet Commonly known as: MUCINEX Take 1,200 mg by mouth 2 (two) times  daily. Give 2 tablet by mouth every 12 hours for congestion for 30 days   insulin glargine 100 UNIT/ML injection Commonly known as: LANTUS Inject 30 Units into the skin in the morning.   ipratropium-albuterol 0.5-2.5 (3) MG/3ML Soln Commonly known as: DUONEB Take 3 mLs by nebulization every 4 (four) hours as needed.   multivitamin Tabs tablet Take 1 tablet by mouth at bedtime.   multivitamin with minerals Tabs tablet Take 1 tablet by mouth every evening. (2100)   oxyCODONE-acetaminophen 5-325 MG tablet Commonly known as: Percocet Take 1 tablet by mouth every 4 (four) hours as needed for severe pain.   pantoprazole 40 MG tablet Commonly known as: PROTONIX Take 1 tablet (40  mg total) by mouth daily at 12 noon.   senna-docusate 8.6-50 MG tablet Commonly known as: Senokot-S Take 1 tablet by mouth 2 (two) times daily.   Velphoro 500 MG chewable tablet Generic drug: sucroferric oxyhydroxide Chew 1,000 mg by mouth 2 (two) times daily. (0900 & 1700)   sucroferric oxyhydroxide 500 MG chewable tablet Commonly known as: VELPHORO Chew 1,000 mg by mouth See admin instructions. Give 2 tablets (1000 mg) by mouth one time a day every Mon, Wed, Fri, Sun with lunch        Diagnostic Studies: No results found.  They benefited maximally from their hospital stay and there were no complications.     Disposition:  To Skilled nursing, [facility]  Discharge Instructions     Negative Pressure Wound Therapy - Incisional   Complete by: As directed        Follow-up Information     [name], MD Follow up in 1 week(s).   Specialty: Orthopedic Surgery Contact information: [address] [place] [address] [phone]                  Signed: Suzan Slick 05/26/2022, 6:20 AM

## 2022-05-26 NOTE — Progress Notes (Addendum)
[facility] Progress Note   Subjective:    Seen and examined at bedside. Eating breakfast with no acute issues. Denies SOB, CP,  Objective Vitals:   05/25/22 1947 05/25/22 2000 05/26/22 0513 05/26/22 0730  BP: 113/64 113/64 135/65 124/72  Pulse: 87 87 83 87  Resp: 17 17 16    Temp: 98.4 F (36.9 C) 98.4 F (36.9 C) 98.5 F (36.9 C) 99.1 F (37.3 C)  TempSrc:  Oral  Oral  SpO2: 92% 92% 96% 99%  Weight:      Height:       Physical Exam General: Well-appearing; NAD Heart: S1 and S2; No murmurs, gallops, or rubs Lungs: Clear throughout; No wheezing, rales, or rhonchi Abdomen: Soft and non-tender Extremities: No edema LLE; R BKA in stump guard Dialysis Access: LUE AVF (+) B/T   Filed Weights   05/24/22 0839 05/24/22 1353 05/24/22 1408  Weight: 115.8 kg 113 kg 113 kg    Intake/Output Summary (Last 24 hours) at 05/26/2022 0955 Last data filed at 05/25/2022 1100 Gross per 24 hour  Intake 120 ml  Output --  Net 120 ml    Additional Objective Labs: Basic Metabolic Panel: Recent Labs  Lab 05/24/22 0735 05/25/22 0154 05/26/22 0229  NA 135 134* 133*  K 5.0 4.6 4.4  CL 97* 95* 92*  CO2 24 30 27   GLUCOSE 208* 121* 96  BUN 27* 19 33*  CREATININE 9.17* 6.47* 8.51*  CALCIUM 9.1 8.9 8.9  PHOS 4.9*  --   --    Liver Function Tests: Recent Labs  Lab 05/24/22 0735  ALBUMIN 2.2*   No results for input(s): "LIPASE", "AMYLASE" in the last 168 hours. CBC: Recent Labs  Lab 05/24/22 0735 05/25/22 0154 05/26/22 0229  WBC 17.9* 16.4* 14.9*  NEUTROABS  --   --  10.4*  HGB 8.3* 8.1* 7.8*  HCT 27.1* 26.1* 24.6*  MCV 77.7* 77.9* 75.9*  PLT 428* 394 446*   Blood Culture    Component Value Date/Time   SDES BLOOD RIGHT HAND 02/07/2022 0648   SPECREQUEST  02/07/2022 0648    BOTTLES DRAWN AEROBIC AND ANAEROBIC Blood Culture results may not be optimal due to an inadequate volume of blood received in culture bottles   CULT  02/07/2022 0648    NO GROWTH 5  DAYS Performed at [facility] [address], [place], [place] [address]    REPTSTATUS 02/12/2022 FINAL 02/07/2022 8101    Cardiac Enzymes: No results for input(s): "CKTOTAL", "CKMB", "CKMBINDEX", "TROPONINI" in the last 168 hours. CBG: Recent Labs  Lab 05/25/22 0857 05/25/22 1130 05/25/22 1550 05/25/22 1948 05/26/22 0730  GLUCAP 79 116* 102* 100* 96   Iron Studies: No results for input(s): "IRON", "TIBC", "TRANSFERRIN", "FERRITIN" in the last 72 hours. Lab Results  Component Value Date   INR 1.3 (H) 02/03/2022   INR 1.3 (H) 02/02/2022   Studies/Results: No results found.  Medications:  sodium chloride 10 mL/hr at 05/23/22 1659   magnesium sulfate bolus IVPB      amLODipine  5 mg Oral Daily   vitamin C  1,000 mg Oral Daily   aspirin EC  81 mg Oral Daily   Chlorhexidine Gluconate Cloth  6 each Topical Q0600   docusate sodium  100 mg Oral Daily   insulin aspart  0-9 Units Subcutaneous TID WC   insulin aspart  2 Units Subcutaneous TID WC   insulin glargine-yfgn  20 Units Subcutaneous QHS   lidocaine-prilocaine   Topical Q T,Th,Sa-HD  nutrition supplement (JUVEN)  1 packet Oral BID BM   pantoprazole  40 mg Oral Q1200   sucroferric oxyhydroxide  1,000 mg Oral BID WC   sucroferric oxyhydroxide  1,000 mg Oral Once per day on Sun Mon Wed Fri   zinc sulfate  220 mg Oral Daily    Dialysis Orders: Unit: [facility] TTS Time:  4:15  EDW: 112 kg  Flows: 450/500 [place]: 2K/2.5 Ca  Access: L AVF 15g  Heparin: 6000 U bolus ESA: Mircera 150  q 2 wks (last 6/29) VDRA: Hectorol 4 qHD  Assessment/Plan: Right foot osteo.-- R BKA 7/7 -- per Dr. Sharol Given  ESRD -  HD TTS. Continue on schedule. Next HD 7/11. Hold heparin  Hypertension/volume  - Blood pressure controlled. No volume excess. Expects weights to drop s/p BKA. Anemia  - Hgb 11.6-8.1-now 7.8 post op  ESA due 7/13.  Follow trends. Transfuse prn  Metabolic bone disease -  Ca/Phos in range. Continue home meds  Nutrition -  Renal diet with fluid restriction  DM - insulin per primary  Prior CVA   Tobie Poet, NP [facility] 05/26/2022,9:55 AM  LOS: 3 days

## 2022-05-26 NOTE — TOC Transition Note (Signed)
Transition of Care [facility]) - CM/SW Discharge Note   Patient Details  Name: [name] CLINARD Sr. MRN: [id number] Date of Birth: 1961/09/29  [facility]) CM/SW Contact:  Joanne Chars, LCSW Phone Number: 05/26/2022, 11:34 AM   Clinical Narrative:   Pt discharging to [facility].  RN call [phone] for report.     Final next level of care: Skilled Nursing Facility Barriers to Discharge: No Barriers Identified   Patient Goals and CMS Choice Patient states their goals for this hospitalization and ongoing recovery are:: walk   Choice offered to / list presented to : Patient  Discharge Placement              Patient chooses bed at: [facility] Patient to be transferred to facility by: [place] Name of family member notified: daughter Valinda Party Patient and family notified of of transfer: 05/26/22  Discharge Plan and Services In-house Referral: Clinical Social Work   Post Acute Care Choice: [place]                               Social Determinants of Health (SDOH) Interventions     Readmission Risk Interventions     No data to display

## 2022-05-26 NOTE — TOC Initial Note (Signed)
Transition of Care (TOC) - Initial/Assessment Note    Patient Details  Name: [name] [name]. MRN: [id number] Date of Birth: 01-31-1961  Transition of [facility]) CM/SW Contact:    Joanne Chars, LCSW Phone Number: 05/26/2022, 11:28 AM  Clinical Narrative:  CSW met with pt regarding DC plan.  Pt admitted from [facility], pt is aware that recommendation is for him to return for rehab and he is agreeable to this.  Permission given to speak with brother Marya Amsler and daughter Valinda Party.    CSW confirmed with Kathy/[facility] that they can receive pt today.                 Expected Discharge Plan: Skilled Nursing Facility Barriers to Discharge: No Barriers Identified   Patient Goals and CMS Choice Patient states their goals for this hospitalization and ongoing recovery are:: walk   Choice offered to / list presented to : Patient  Expected Discharge Plan and Services Expected Discharge Plan: [place] In-house Referral: Clinical Social Work   Post Acute Care Choice: [place] Living arrangements for the past 2 months: [place] (has been at [facility]) Expected Discharge Date: 05/26/22                                    Prior Living Arrangements/Services Living arrangements for the past 2 months: [place] (has been at [facility]) Lives with:: Self Patient language and need for interpreter reviewed:: Yes Do you feel safe going back to the place where you live?: Yes      Need for Family Participation in Patient Care: No (Comment) Care giver support system in place?: Yes (comment) Current home services: Other (comment) (na) Criminal Activity/Legal Involvement Pertinent to Current Situation/Hospitalization: No - Comment as needed  Activities of Daily Living Home Assistive Devices/Equipment: Eyeglasses ADL Screening (condition at time of admission) Patient's cognitive ability adequate to  safely complete daily activities?: Yes Is the patient deaf or have difficulty hearing?: No Does the patient have difficulty seeing, even when wearing glasses/contacts?: No Does the patient have difficulty concentrating, remembering, or making decisions?: No Patient able to express need for assistance with ADLs?: Yes Does the patient have difficulty dressing or bathing?: Yes Independently performs ADLs?: Yes (appropriate for developmental age) Does the patient have difficulty walking or climbing stairs?: Yes Weakness of Legs: Both Weakness of Arms/Hands: None  Permission Sought/Granted Permission sought to share information with : Family Supports Permission granted to share information with : Yes, Verbal Permission Granted  Share Information with NAME: brother Marya Amsler, daughter Valinda Party  Permission granted to share info w AGENCY: [employer]        Emotional Assessment Appearance:: Appears stated age Attitude/Demeanor/Rapport: Engaged Affect (typically observed): Appropriate, Pleasant Orientation: : Oriented to Self, Oriented to Place, Oriented to  Time, Oriented to Situation Alcohol / Substance Use: Not Applicable Psych Involvement: No (comment)  Admission diagnosis:  Below-knee amputation of right lower extremity ([place]) [U72.536U] Patient Active Problem List   Diagnosis Date Noted   CAD (coronary artery disease) 05/24/2022   Leukocytosis 05/24/2022   Below-knee amputation of right lower extremity ([place]) 05/23/2022   Obesity (BMI 30-39.9) 02/19/2022   History of CVA  02/05/2022   Carotid stenosis 02/05/2022   acute on chronic Anemia of chronic renal failure  02/03/2022   Subacute osteomyelitis, right ankle and foot ([place]) 44/01/4741   Acute metabolic encephalopathy 59/56/3875  Protein-calorie malnutrition, mild ([place]) 02/02/2022   GERD (gastroesophageal reflux disease) 34/75/8307   Chronic systolic CHF (congestive heart failure) ([place]) 08/14/2019   LBBB (left bundle branch  block) 08/13/2019   Problem with vascular access 08/13/2019   Dialysis AV fistula malfunction ([place]) 08/12/2019   ESRD (end stage renal disease) ([place]) 09/20/2018   DM2 (diabetes mellitus, type 2) ([place]) 09/20/2018   HTN (hypertension) 09/20/2018   PCP:  [place]:   [facility] [place], [place] AT [address] [place] [address] Phone: [phone] Fax: [phone]     Social Determinants of Health (SDOH) Interventions    Readmission Risk Interventions     No data to display

## 2022-05-26 NOTE — Progress Notes (Signed)
D/C order noted. Per CSW, pt admitted from [place] [facility] snf and will return to snf today. Contacted [facility] to advise clinic of pt's d/c today and that pt will resume care tomorrow.   Melven Sartorius Renal Navigator [phone]

## 2022-05-26 NOTE — Telephone Encounter (Signed)
Pt's daughter Ross Ludwig called requesting a work note . He dad had surgery and she need a work note due to being with her dad his surgery date 05/23/22. Please e mail note. Please email to [email or web address]. Pt daughter phone number is [phone].

## 2022-05-26 NOTE — Plan of Care (Signed)
  Problem: Coping: Goal: Ability to adjust to condition or change in health will improve Outcome: Progressing   Problem: Nutritional: Goal: Maintenance of adequate nutrition will improve Outcome: Progressing   Problem: Health Behavior/Discharge Planning: Goal: Ability to manage health-related needs will improve Outcome: Progressing

## 2022-05-26 NOTE — Telephone Encounter (Signed)
Letter emailed to pt's dtr.

## 2022-05-26 NOTE — Progress Notes (Signed)
PROGRESS NOTE    [name] Cruel Sr.  VHQ:[id number] DOB: 1960-12-30 DOA: 05/23/2022 PCP: [facility]    Brief Narrative:  Followed for medical management of multiple chronic medical issues including type 2 diabetes, hypertension as she was brought for elective right transtibial amputation.  Remains a stable.  Received hemodialysis on his schedule.   Assessment & Plan:   Medical issues are well controlled and his discharge medications including insulin, antihypertensives reviewed and appropriate.  Right below-knee amputation 7/7: Apparently well controlled pain symptoms.  We will discuss with vascular surgery whether patient will need to continue on antibiotics if it was amputation to clean margin.   DVT prophylaxis: SCD's Start: 05/23/22 1537   Code Status: Full code Family Communication: None Disposition Plan: Status is: Inpatient Plan discharge today.      Procedures:  Right BKA 7/7 Hemodialysis  Antimicrobials:  Cefepime with dialysis   Subjective: Patient seen and examined.  He tells me he is fine and he is going back to nursing home today.  Objective: Vitals:   05/25/22 1947 05/25/22 2000 05/26/22 0513 05/26/22 0730  BP: 113/64 113/64 135/65 124/72  Pulse: 87 87 83 87  Resp: 17 17 16    Temp: 98.4 F (36.9 C) 98.4 F (36.9 C) 98.5 F (36.9 C) 99.1 F (37.3 C)  TempSrc:  Oral  Oral  SpO2: 92% 92% 96% 99%  Weight:      Height:       No intake or output data in the 24 hours ending 05/26/22 1240 Filed Weights   05/24/22 0839 05/24/22 1353 05/24/22 1408  Weight: 115.8 kg 113 kg 113 kg    Examination:  Looks fairly comfortable.  Pain is managed.  Right foot is on postop dressing with wound VAC.    Data Reviewed: I have personally reviewed following labs and imaging studies  CBC: Recent Labs  Lab 05/23/22 0804 05/24/22 0735 05/25/22 0154 05/26/22 0229  WBC  --  17.9* 16.4* 14.9*  NEUTROABS  --   --   --  10.4*  HGB  11.6* 8.3* 8.1* 7.8*  HCT 34.0* 27.1* 26.1* 24.6*  MCV  --  77.7* 77.9* 75.9*  PLT  --  428* 394 413*   Basic Metabolic Panel: Recent Labs  Lab 05/23/22 0804 05/23/22 0825 05/24/22 0735 05/25/22 0154 05/26/22 0229  NA 137 138 135 134* 133*  K 6.1* 4.4 5.0 4.6 4.4  CL 98 95* 97* 95* 92*  CO2  --  28 24 30 27   GLUCOSE 228* 209* 208* 121* 96  BUN 24* 17 27* 19 33*  CREATININE 7.40* 7.08* 9.17* 6.47* 8.51*  CALCIUM  --  9.7 9.1 8.9 8.9  PHOS  --   --  4.9*  --   --    GFR: Estimated Creatinine Clearance: 12.3 mL/min (A) (by C-G formula based on SCr of 8.51 mg/dL (H)). Liver Function Tests: Recent Labs  Lab 05/24/22 0735  ALBUMIN 2.2*   No results for input(s): "LIPASE", "AMYLASE" in the last 168 hours. No results for input(s): "AMMONIA" in the last 168 hours. Coagulation Profile: No results for input(s): "INR", "PROTIME" in the last 168 hours. Cardiac Enzymes: No results for input(s): "CKTOTAL", "CKMB", "CKMBINDEX", "TROPONINI" in the last 168 hours. BNP (last 3 results) No results for input(s): "PROBNP" in the last 8760 hours. HbA1C: Recent Labs    05/24/22 1750  HGBA1C 8.2*   CBG: Recent Labs  Lab 05/25/22 1130 05/25/22 1550 05/25/22 1948 05/26/22 0730  05/26/22 1152  GLUCAP 116* 102* 100* 96 93   Lipid Profile: No results for input(s): "CHOL", "HDL", "LDLCALC", "TRIG", "CHOLHDL", "LDLDIRECT" in the last 72 hours. Thyroid Function Tests: Recent Labs    05/25/22 0154  TSH 3.922   Anemia Panel: No results for input(s): "VITAMINB12", "FOLATE", "FERRITIN", "TIBC", "IRON", "RETICCTPCT" in the last 72 hours. Sepsis Labs: No results for input(s): "PROCALCITON", "LATICACIDVEN" in the last 168 hours.  No results found for this or any previous visit (from the past 240 hour(s)).       Radiology Studies: No results found.      Scheduled Meds:  amLODipine  5 mg Oral Daily   vitamin C  1,000 mg Oral Daily   aspirin EC  81 mg Oral Daily    Chlorhexidine Gluconate Cloth  6 each Topical Q0600   docusate sodium  100 mg Oral Daily   insulin aspart  0-9 Units Subcutaneous TID WC   insulin aspart  2 Units Subcutaneous TID WC   insulin glargine-yfgn  20 Units Subcutaneous QHS   lidocaine-prilocaine   Topical Q T,Th,Sa-HD   nutrition supplement (JUVEN)  1 packet Oral BID BM   pantoprazole  40 mg Oral Q1200   sucroferric oxyhydroxide  1,000 mg Oral BID WC   sucroferric oxyhydroxide  1,000 mg Oral Once per day on Sun Mon Wed Fri   zinc sulfate  220 mg Oral Daily   Continuous Infusions:  sodium chloride 10 mL/hr at 05/23/22 1659   magnesium sulfate bolus IVPB       LOS: 3 days    Time spent: 25 minutes    Barb Merino, MD [facility] Pager [phone]

## 2022-05-28 ENCOUNTER — Other Ambulatory Visit: Payer: Self-pay | Admitting: Family

## 2022-05-30 ENCOUNTER — Encounter: Payer: Medicare Other | Admitting: Family

## 2022-05-30 ENCOUNTER — Ambulatory Visit: Payer: Medicare Other | Admitting: Podiatry

## 2022-06-06 ENCOUNTER — Encounter: Payer: Self-pay | Admitting: Family

## 2022-06-06 ENCOUNTER — Ambulatory Visit (INDEPENDENT_AMBULATORY_CARE_PROVIDER_SITE_OTHER): Payer: Medicare Other | Admitting: Family

## 2022-06-06 DIAGNOSIS — M86271 Subacute osteomyelitis, right ankle and foot: Secondary | ICD-10-CM

## 2022-06-06 NOTE — Progress Notes (Signed)
Post-Op Visit Note   Patient: [name] HEMMELGARN Sr.           Date of Birth: October 21, 1961           MRN: [id number] Visit Date: 06/06/2022 PCP: [facility]  Chief Complaint: No chief complaint on file.   HPI:  HPI The patient is a 61 year old gentleman who is seen status post right below-knee amputation he is currently residing at skilled nursing  Patient is a new right transtibial  amputee.  Patient's current comorbidities are not expected to impact the ability to function with the prescribed prosthesis. Patient verbally communicates a strong desire to use a prosthesis. Patient currently requires mobility aids to ambulate without a prosthesis.  Expects not to use mobility aids with a new prosthesis.  Patient is a K3 level ambulator that spends a lot of time walking around on uneven terrain over obstacles, up and down stairs, and ambulates with a variable cadence.    Ortho Exam On examination of the right residual limb this is well consolidated staples are in place there is no gaping no active drainage no erythema  Visit Diagnoses: No diagnosis found.  Plan: Begin daily Dial soap cleansing dry dressings continue shrinker follow-up in the office in 2 more weeks for staple removal  Follow-Up Instructions: Return in about 3 months (around 09/06/2022).   Imaging: No results found.  Orders:  No orders of the defined types were placed in this encounter.  No orders of the defined types were placed in this encounter.    PMFS History: Patient Active Problem List   Diagnosis Date Noted   CAD (coronary artery disease) 05/24/2022   Leukocytosis 05/24/2022   Below-knee amputation of right lower extremity ([place]) 05/23/2022   Obesity (BMI 30-39.9) 02/19/2022   History of CVA  02/05/2022   Carotid stenosis 02/05/2022   acute on chronic Anemia of chronic renal failure  02/03/2022   Subacute osteomyelitis, right ankle and foot ([place]) 93/26/7124   Acute  metabolic encephalopathy 58/07/9832   Protein-calorie malnutrition, mild ([place]) 02/02/2022   GERD (gastroesophageal reflux disease) 82/50/5397   Chronic systolic CHF (congestive heart failure) ([place]) 08/14/2019   LBBB (left bundle branch block) 08/13/2019   Problem with vascular access 08/13/2019   Dialysis AV fistula malfunction ([place]) 08/12/2019   ESRD (end stage renal disease) ([place]) 09/20/2018   DM2 (diabetes mellitus, type 2) ([place]) 09/20/2018   HTN (hypertension) 09/20/2018   Past Medical History:  Diagnosis Date   CHF (congestive heart failure) (HCC)    Diabetes mellitus without complication ([place])    type 2   ESRD on hemodialysis ([place])    mon wed fri   Hypertension    Kidney failure    Myocardial infarction ([place])    Nonischemic cardiomyopathy ([place])     Family History  Problem Relation Age of Onset   Hypertension Mother     Past Surgical History:  Procedure Laterality Date   A/V FISTULAGRAM N/A 08/15/2019   Procedure: A/V FISTULAGRAM;  Surgeon: [name], MD;  Location: [facility];  Service: Cardiovascular;  Laterality: N/A;   AMPUTATION Right 02/02/2022   Procedure: AMPUTATION 5th RAY;  Surgeon: [name], MD;  Location: [facility];  Service: Podiatry;  Laterality: Right;   AMPUTATION Right 05/23/2022   Procedure: RIGHT BELOW KNEE AMPUTATION;  Surgeon: [name], MD;  Location: [facility];  Service: Orthopedics;  Laterality: Right;   APPLICATION OF WOUND VAC  05/23/2022   Procedure:  APPLICATION OF WOUND VAC;  Surgeon: [name], MD;  Location: [facility];  Service: Orthopedics;;   AV FISTULA PLACEMENT     CARDIAC CATHETERIZATION  02/17/2018   LHC 02/17/18 ([name]): 30% pLAD, 30% dRCA, LVEDP 25-30, LVEF 25%.     CATARACT EXTRACTION W/PHACO Right 03/29/2020   Procedure: CATARACT EXTRACTION PHACO AND INTRAOCULAR LENS PLACEMENT ([place])  RIGHT DIABETIC;  Surgeon: [name], MD;  Location: [facility];  Service: Ophthalmology;  Laterality: Right;   Lot # [id number] H [place]: 02:30.9 AP% 9.5% CDE: 14.38   EYE SURGERY Right    cataract removed   I & D EXTREMITY Right 02/02/2022   Procedure: IRRIGATION AND DEBRIDEMENT RIGHT FOOT;  Surgeon: [name], MD;  Location: [facility];  Service: Podiatry;  Laterality: Right;   INCISION AND DRAINAGE OF WOUND Right 02/08/2022   Procedure: IRRIGATION AND DEBRIDEMENT WOUND;  Surgeon: [name], DPM;  Location: [facility];  Service: Podiatry;  Laterality: Right;  with removal of infected bone    INSERTION OF DIALYSIS CATHETER  08/13/2019   Procedure: Insertion Of Dialysis Catheter;  Surgeon: [name], MD;  Location: [facility];  Service: Vascular;;   INSERTION OF DIALYSIS CATHETER Left 07/19/2020   Procedure: INSERTION OF DIALYSIS CATHETER;  Surgeon: [name], MD;  Location: [facility];  Service: Vascular;  Laterality: Left;   IRRIGATION AND DEBRIDEMENT ABSCESS     PERIPHERAL VASCULAR BALLOON ANGIOPLASTY  08/15/2019   Procedure: PERIPHERAL VASCULAR BALLOON ANGIOPLASTY;  Surgeon: [name], MD;  Location: [facility];  Service: Cardiovascular;;   PERIPHERAL VASCULAR BALLOON ANGIOPLASTY Left 02/06/2022   Procedure: PERIPHERAL VASCULAR BALLOON ANGIOPLASTY;  Surgeon: [name], MD;  Location: [facility];  Service: Cardiovascular;  Laterality: Left;   REVISON OF ARTERIOVENOUS FISTULA Left 07/19/2020   Procedure: REVISON/PLICATION OF ARTERIOVENOUS FISTULA LEFT;  Surgeon: [name], MD;  Location: [facility];  Service: Vascular;  Laterality: Left;   THROMBECTOMY AND REVISION OF ARTERIOVENTOUS (AV) GORETEX  GRAFT Left 08/13/2019   Procedure: THROMBECTOMY OF LEFT ARM ARTERIOVENTOUS (AV) FISTULA;  Surgeon: [name], MD;  [facility];  Service: Vascular;  Laterality: Left;   TOE AMPUTATION     Social History   Occupational History   Not on file  Tobacco Use   Smoking status: Never   Smokeless tobacco: Never  Vaping Use   Vaping Use: Never used   Substance and Sexual Activity   Alcohol use: Not Currently   Drug use: Never   Sexual activity: Not on file

## 2022-06-11 ENCOUNTER — Ambulatory Visit: Payer: Medicare Other | Admitting: Infectious Diseases

## 2022-06-20 ENCOUNTER — Encounter: Payer: Self-pay | Admitting: Family

## 2022-06-20 ENCOUNTER — Ambulatory Visit (INDEPENDENT_AMBULATORY_CARE_PROVIDER_SITE_OTHER): Payer: Medicare Other | Admitting: Family

## 2022-06-20 DIAGNOSIS — Z89511 Acquired absence of right leg below knee: Secondary | ICD-10-CM

## 2022-06-20 DIAGNOSIS — S88111A Complete traumatic amputation at level between knee and ankle, right lower leg, initial encounter: Secondary | ICD-10-CM

## 2022-06-20 NOTE — Progress Notes (Signed)
Post-Op Visit Note   Patient: [name] MCNAMEE Sr.           Date of Birth: January 10, 1961           MRN: [id number] Visit Date: 06/20/2022 PCP: [facility]  Chief Complaint:  Chief Complaint  Patient presents with   Right Leg - Routine Post Op    05/14/2022 right BKA with Kerecis     HPI:  HPI The patient is a 61 year old gentleman seen status post right below-knee amputation.  He is currently residing at skilled nursing his incision is healing well the limb is consolidating. Ortho Exam On examination of the right residual limb this is well consolidated well-healed  Visit Diagnoses: No diagnosis found.  Plan: He will call and proceed with prosthesis set up.  Follow-up in the office in 2 months  Follow-Up Instructions: No follow-ups on file.   Imaging: No results found.  Orders:  No orders of the defined types were placed in this encounter.  No orders of the defined types were placed in this encounter.    PMFS History: Patient Active Problem List   Diagnosis Date Noted   CAD (coronary artery disease) 05/24/2022   Leukocytosis 05/24/2022   Below-knee amputation of right lower extremity ([place]) 05/23/2022   Obesity (BMI 30-39.9) 02/19/2022   History of CVA  02/05/2022   Carotid stenosis 02/05/2022   acute on chronic Anemia of chronic renal failure  02/03/2022   Subacute osteomyelitis, right ankle and foot ([place]) 60/73/7106   Acute metabolic encephalopathy 26/94/8546   Protein-calorie malnutrition, mild ([place]) 02/02/2022   GERD (gastroesophageal reflux disease) 27/01/5008   Chronic systolic CHF (congestive heart failure) ([place]) 08/14/2019   LBBB (left bundle branch block) 08/13/2019   Problem with vascular access 08/13/2019   Dialysis AV fistula malfunction ([place]) 08/12/2019   ESRD (end stage renal disease) ([place]) 09/20/2018   DM2 (diabetes mellitus, type 2) ([place]) 09/20/2018   HTN (hypertension) 09/20/2018   Past Medical History:  Diagnosis Date    CHF (congestive heart failure) (HCC)    Diabetes mellitus without complication ([place])    type 2   ESRD on hemodialysis ([place])    mon wed fri   Hypertension    Kidney failure    Myocardial infarction ([place])    Nonischemic cardiomyopathy ([place])     Family History  Problem Relation Age of Onset   Hypertension Mother     Past Surgical History:  Procedure Laterality Date   A/V FISTULAGRAM N/A 08/15/2019   Procedure: A/V FISTULAGRAM;  Surgeon: [name], MD;  Location: [facility];  Service: Cardiovascular;  Laterality: N/A;   AMPUTATION Right 02/02/2022   Procedure: AMPUTATION 5th RAY;  Surgeon: [name], MD;  Location: [facility];  Service: Podiatry;  Laterality: Right;   AMPUTATION Right 05/23/2022   Procedure: RIGHT BELOW KNEE AMPUTATION;  Surgeon: [name], MD;  Location: [facility];  Service: Orthopedics;  Laterality: Right;   APPLICATION OF WOUND VAC  05/23/2022   Procedure: APPLICATION OF WOUND VAC;  Surgeon: [name], MD;  Location: [facility];  Service: Orthopedics;;   AV FISTULA PLACEMENT     CARDIAC CATHETERIZATION  02/17/2018   LHC 02/17/18 ([name]): 30% pLAD, 30% dRCA, LVEDP 25-30, LVEF 25%.     CATARACT EXTRACTION W/PHACO Right 03/29/2020   Procedure: CATARACT EXTRACTION PHACO AND INTRAOCULAR LENS PLACEMENT ([place])  RIGHT DIABETIC;  Surgeon: [name], MD;  Location: [facility];  Service: Ophthalmology;  Laterality:  Right;  Lot # [id number] H [place]: 02:30.9 AP% 9.5% CDE: 14.38   EYE SURGERY Right    cataract removed   I & D EXTREMITY Right 02/02/2022   Procedure: IRRIGATION AND DEBRIDEMENT RIGHT FOOT;  Surgeon: [name], MD;  Location: [facility];  Service: Podiatry;  Laterality: Right;   INCISION AND DRAINAGE OF WOUND Right 02/08/2022   Procedure: IRRIGATION AND DEBRIDEMENT WOUND;  Surgeon: [name], DPM;  Location: [facility];  Service: Podiatry;  Laterality: Right;  with removal of infected bone    INSERTION OF DIALYSIS CATHETER   08/13/2019   Procedure: Insertion Of Dialysis Catheter;  Surgeon: [name], MD;  Location: [facility];  Service: Vascular;;   INSERTION OF DIALYSIS CATHETER Left 07/19/2020   Procedure: INSERTION OF DIALYSIS CATHETER;  Surgeon: [name], MD;  Location: [facility];  Service: Vascular;  Laterality: Left;   IRRIGATION AND DEBRIDEMENT ABSCESS     PERIPHERAL VASCULAR BALLOON ANGIOPLASTY  08/15/2019   Procedure: PERIPHERAL VASCULAR BALLOON ANGIOPLASTY;  Surgeon: [name], MD;  Location: [facility];  Service: Cardiovascular;;   PERIPHERAL VASCULAR BALLOON ANGIOPLASTY Left 02/06/2022   Procedure: PERIPHERAL VASCULAR BALLOON ANGIOPLASTY;  Surgeon: [name], MD;  Location: [facility];  Service: Cardiovascular;  Laterality: Left;   REVISON OF ARTERIOVENOUS FISTULA Left 07/19/2020   Procedure: REVISON/PLICATION OF ARTERIOVENOUS FISTULA LEFT;  Surgeon: [name], MD;  Location: [facility];  Service: Vascular;  Laterality: Left;   THROMBECTOMY AND REVISION OF ARTERIOVENTOUS (AV) GORETEX  GRAFT Left 08/13/2019   Procedure: THROMBECTOMY OF LEFT ARM ARTERIOVENTOUS (AV) FISTULA;  Surgeon: [name], MD;  [facility];  Service: Vascular;  Laterality: Left;   TOE AMPUTATION     Social History   Occupational History   Not on file  Tobacco Use   Smoking status: Never   Smokeless tobacco: Never  Vaping Use   Vaping Use: Never used  Substance and Sexual Activity   Alcohol use: Not Currently   Drug use: Never   Sexual activity: Not on file

## 2022-07-01 ENCOUNTER — Encounter (HOSPITAL_COMMUNITY): Payer: Self-pay

## 2022-07-01 ENCOUNTER — Emergency Department (HOSPITAL_COMMUNITY): Payer: Medicare Other

## 2022-07-01 ENCOUNTER — Inpatient Hospital Stay (HOSPITAL_COMMUNITY)
Admission: EM | Admit: 2022-07-01 | Discharge: 2022-07-08 | DRG: 463 | Disposition: A | Discharge status: Skilled Nursing Facility | Payer: Medicare Other | Source: Skilled Nursing Facility | Attending: Internal Medicine | Admitting: Internal Medicine

## 2022-07-01 ENCOUNTER — Other Ambulatory Visit: Payer: Self-pay

## 2022-07-01 DIAGNOSIS — E871 Hypo-osmolality and hyponatremia: Secondary | ICD-10-CM | POA: Diagnosis present

## 2022-07-01 DIAGNOSIS — K219 Gastro-esophageal reflux disease without esophagitis: Secondary | ICD-10-CM | POA: Diagnosis present

## 2022-07-01 DIAGNOSIS — E1165 Type 2 diabetes mellitus with hyperglycemia: Secondary | ICD-10-CM | POA: Diagnosis not present

## 2022-07-01 DIAGNOSIS — T8781 Dehiscence of amputation stump: Secondary | ICD-10-CM | POA: Diagnosis not present

## 2022-07-01 DIAGNOSIS — I509 Heart failure, unspecified: Secondary | ICD-10-CM | POA: Diagnosis not present

## 2022-07-01 DIAGNOSIS — L089 Local infection of the skin and subcutaneous tissue, unspecified: Principal | ICD-10-CM

## 2022-07-01 DIAGNOSIS — I132 Hypertensive heart and chronic kidney disease with heart failure and with stage 5 chronic kidney disease, or end stage renal disease: Secondary | ICD-10-CM | POA: Diagnosis present

## 2022-07-01 DIAGNOSIS — Y835 Amputation of limb(s) as the cause of abnormal reaction of the patient, or of later complication, without mention of misadventure at the time of the procedure: Secondary | ICD-10-CM | POA: Diagnosis present

## 2022-07-01 DIAGNOSIS — Z8249 Family history of ischemic heart disease and other diseases of the circulatory system: Secondary | ICD-10-CM

## 2022-07-01 DIAGNOSIS — I2510 Atherosclerotic heart disease of native coronary artery without angina pectoris: Secondary | ICD-10-CM | POA: Diagnosis present

## 2022-07-01 DIAGNOSIS — L03115 Cellulitis of right lower limb: Secondary | ICD-10-CM | POA: Diagnosis present

## 2022-07-01 DIAGNOSIS — I5023 Acute on chronic systolic (congestive) heart failure: Secondary | ICD-10-CM | POA: Diagnosis present

## 2022-07-01 DIAGNOSIS — I428 Other cardiomyopathies: Secondary | ICD-10-CM | POA: Diagnosis present

## 2022-07-01 DIAGNOSIS — Z91158 Patient's noncompliance with renal dialysis for other reason: Secondary | ICD-10-CM

## 2022-07-01 DIAGNOSIS — N186 End stage renal disease: Secondary | ICD-10-CM | POA: Diagnosis present

## 2022-07-01 DIAGNOSIS — L02415 Cutaneous abscess of right lower limb: Secondary | ICD-10-CM | POA: Diagnosis not present

## 2022-07-01 DIAGNOSIS — N2581 Secondary hyperparathyroidism of renal origin: Secondary | ICD-10-CM | POA: Diagnosis present

## 2022-07-01 DIAGNOSIS — T148XXA Other injury of unspecified body region, initial encounter: Secondary | ICD-10-CM | POA: Diagnosis not present

## 2022-07-01 DIAGNOSIS — Z794 Long term (current) use of insulin: Secondary | ICD-10-CM

## 2022-07-01 DIAGNOSIS — E119 Type 2 diabetes mellitus without complications: Secondary | ICD-10-CM

## 2022-07-01 DIAGNOSIS — N185 Chronic kidney disease, stage 5: Secondary | ICD-10-CM

## 2022-07-01 DIAGNOSIS — T8789 Other complications of amputation stump: Secondary | ICD-10-CM | POA: Diagnosis not present

## 2022-07-01 DIAGNOSIS — L8990 Pressure ulcer of unspecified site, unspecified stage: Secondary | ICD-10-CM | POA: Insufficient documentation

## 2022-07-01 DIAGNOSIS — Z992 Dependence on renal dialysis: Secondary | ICD-10-CM | POA: Diagnosis not present

## 2022-07-01 DIAGNOSIS — E11649 Type 2 diabetes mellitus with hypoglycemia without coma: Secondary | ICD-10-CM | POA: Diagnosis present

## 2022-07-01 DIAGNOSIS — D631 Anemia in chronic kidney disease: Secondary | ICD-10-CM | POA: Diagnosis present

## 2022-07-01 DIAGNOSIS — T8743 Infection of amputation stump, right lower extremity: Principal | ICD-10-CM | POA: Diagnosis present

## 2022-07-01 DIAGNOSIS — I5022 Chronic systolic (congestive) heart failure: Secondary | ICD-10-CM | POA: Diagnosis present

## 2022-07-01 DIAGNOSIS — Z20822 Contact with and (suspected) exposure to covid-19: Secondary | ICD-10-CM | POA: Diagnosis present

## 2022-07-01 DIAGNOSIS — I252 Old myocardial infarction: Secondary | ICD-10-CM | POA: Diagnosis not present

## 2022-07-01 DIAGNOSIS — E1100 Type 2 diabetes mellitus with hyperosmolarity without nonketotic hyperglycemic-hyperosmolar coma (NKHHC): Secondary | ICD-10-CM | POA: Diagnosis not present

## 2022-07-01 DIAGNOSIS — L89323 Pressure ulcer of left buttock, stage 3: Secondary | ICD-10-CM | POA: Diagnosis present

## 2022-07-01 DIAGNOSIS — R197 Diarrhea, unspecified: Secondary | ICD-10-CM

## 2022-07-01 DIAGNOSIS — E785 Hyperlipidemia, unspecified: Secondary | ICD-10-CM | POA: Diagnosis present

## 2022-07-01 DIAGNOSIS — Z91119 Patient's noncompliance with dietary regimen due to unspecified reason: Secondary | ICD-10-CM

## 2022-07-01 DIAGNOSIS — E1122 Type 2 diabetes mellitus with diabetic chronic kidney disease: Secondary | ICD-10-CM | POA: Diagnosis present

## 2022-07-01 DIAGNOSIS — I10 Essential (primary) hypertension: Secondary | ICD-10-CM | POA: Diagnosis not present

## 2022-07-01 DIAGNOSIS — Z882 Allergy status to sulfonamides status: Secondary | ICD-10-CM

## 2022-07-01 DIAGNOSIS — K2100 Gastro-esophageal reflux disease with esophagitis, without bleeding: Secondary | ICD-10-CM | POA: Diagnosis not present

## 2022-07-01 DIAGNOSIS — E876 Hypokalemia: Secondary | ICD-10-CM | POA: Diagnosis present

## 2022-07-01 LAB — CBC WITH DIFFERENTIAL/PLATELET
Abs Immature Granulocytes: 0.06 10*3/uL (ref 0.00–0.07)
Basophils Absolute: 0.1 10*3/uL (ref 0.0–0.1)
Basophils Relative: 1 %
Eosinophils Absolute: 0.4 10*3/uL (ref 0.0–0.5)
Eosinophils Relative: 3 %
HCT: 30.8 % — ABNORMAL LOW (ref 39.0–52.0)
Hemoglobin: 9.3 g/dL — ABNORMAL LOW (ref 13.0–17.0)
Immature Granulocytes: 1 %
Lymphocytes Relative: 7 %
Lymphs Abs: 0.9 10*3/uL (ref 0.7–4.0)
MCH: 23.8 pg — ABNORMAL LOW (ref 26.0–34.0)
MCHC: 30.2 g/dL (ref 30.0–36.0)
MCV: 79 fL — ABNORMAL LOW (ref 80.0–100.0)
Monocytes Absolute: 2.2 10*3/uL — ABNORMAL HIGH (ref 0.1–1.0)
Monocytes Relative: 17 %
Neutro Abs: 9.6 10*3/uL — ABNORMAL HIGH (ref 1.7–7.7)
Neutrophils Relative %: 71 %
Platelets: 304 10*3/uL (ref 150–400)
RBC: 3.9 MIL/uL — ABNORMAL LOW (ref 4.22–5.81)
RDW: 22.2 % — ABNORMAL HIGH (ref 11.5–15.5)
WBC: 13.3 10*3/uL — ABNORMAL HIGH (ref 4.0–10.5)
nRBC: 0.3 % — ABNORMAL HIGH (ref 0.0–0.2)

## 2022-07-01 LAB — COMPREHENSIVE METABOLIC PANEL
ALT: 17 U/L (ref 0–44)
AST: 19 U/L (ref 15–41)
Albumin: 2.6 g/dL — ABNORMAL LOW (ref 3.5–5.0)
Alkaline Phosphatase: 174 U/L — ABNORMAL HIGH (ref 38–126)
Anion gap: 15 (ref 5–15)
BUN: 76 mg/dL — ABNORMAL HIGH (ref 6–20)
CO2: 25 mmol/L (ref 22–32)
Calcium: 9.3 mg/dL (ref 8.9–10.3)
Chloride: 95 mmol/L — ABNORMAL LOW (ref 98–111)
Creatinine, Ser: 11.87 mg/dL — ABNORMAL HIGH (ref 0.61–1.24)
GFR, Estimated: 4 mL/min — ABNORMAL LOW (ref >60)
Glucose, Bld: 56 mg/dL — ABNORMAL LOW (ref 70–99)
Potassium: 4.7 mmol/L (ref 3.5–5.1)
Sodium: 135 mmol/L (ref 135–145)
Total Bilirubin: 2.1 mg/dL — ABNORMAL HIGH (ref 0.3–1.2)
Total Protein: 8.3 g/dL — ABNORMAL HIGH (ref 6.5–8.1)

## 2022-07-01 LAB — APTT: aPTT: 37 seconds — ABNORMAL HIGH (ref 24–36)

## 2022-07-01 LAB — GLUCOSE, CAPILLARY
Glucose-Capillary: 51 mg/dL — ABNORMAL LOW (ref 70–99)
Glucose-Capillary: 51 mg/dL — ABNORMAL LOW (ref 70–99)

## 2022-07-01 LAB — LACTIC ACID, PLASMA: Lactic Acid, Venous: 1.3 mmol/L (ref 0.5–1.9)

## 2022-07-01 LAB — SARS CORONAVIRUS 2 BY RT PCR: SARS Coronavirus 2 by RT PCR: NEGATIVE

## 2022-07-01 LAB — PROTIME-INR
INR: 1.4 — ABNORMAL HIGH (ref 0.8–1.2)
Prothrombin Time: 17.1 seconds — ABNORMAL HIGH (ref 11.4–15.2)

## 2022-07-01 LAB — BRAIN NATRIURETIC PEPTIDE: B Natriuretic Peptide: 1670.9 pg/mL — ABNORMAL HIGH (ref 0.0–100.0)

## 2022-07-01 MED ORDER — LIDOCAINE HCL (PF) 1 % IJ SOLN: 5.0000 mL | INTRAMUSCULAR | Status: DC | PRN

## 2022-07-01 MED ORDER — HEPARIN SODIUM (PORCINE) 1000 UNIT/ML IJ SOLN
6000.0000 [IU] | Freq: Once | INTRAMUSCULAR | Status: AC
  Administered 2022-07-01: 6000 [IU] via INTRAVENOUS

## 2022-07-01 MED ORDER — FERRIC CITRATE 1 GM 210 MG(FE) PO TABS: 420.0000 mg | ORAL_TABLET | Freq: Three times a day (TID) | ORAL | Status: DC

## 2022-07-01 MED ORDER — PENTAFLUOROPROP-TETRAFLUOROETH EX AERO
1.0000 | INHALATION_SPRAY | CUTANEOUS | Status: DC | PRN
  Filled 2022-07-01: qty 30

## 2022-07-01 MED ORDER — VANCOMYCIN VARIABLE DOSE PER UNSTABLE RENAL FUNCTION (PHARMACIST DOSING): Status: DC

## 2022-07-01 MED ORDER — SODIUM CHLORIDE 0.9% FLUSH
3.0000 mL | Freq: Two times a day (BID) | INTRAVENOUS | Status: DC
  Administered 2022-07-02 – 2022-07-08 (×13): 3 mL via INTRAVENOUS

## 2022-07-01 MED ORDER — HYDROCODONE-ACETAMINOPHEN 5-325 MG PO TABS: 1.0000 | ORAL_TABLET | ORAL | Status: DC | PRN

## 2022-07-01 MED ORDER — IPRATROPIUM-ALBUTEROL 0.5-2.5 (3) MG/3ML IN SOLN
3.0000 mL | RESPIRATORY_TRACT | Status: DC | PRN
Start: 2022-07-01 — End: 2022-07-09

## 2022-07-01 MED ORDER — SUCROFERRIC OXYHYDROXIDE 500 MG PO CHEW: 1000.0000 mg | CHEWABLE_TABLET | Freq: Two times a day (BID) | ORAL | Status: DC

## 2022-07-01 MED ORDER — LOSARTAN POTASSIUM 25 MG PO TABS
25.0000 mg | ORAL_TABLET | Freq: Every day | ORAL | Status: DC
  Administered 2022-07-02 (×2): 25 mg via ORAL
  Filled 2022-07-01 (×2): qty 1

## 2022-07-01 MED ORDER — VANCOMYCIN HCL IN DEXTROSE 1-5 GM/200ML-% IV SOLN
1000.0000 mg | Freq: Once | INTRAVENOUS | Status: AC
  Administered 2022-07-02: 1000 mg via INTRAVENOUS
  Filled 2022-07-01: qty 200

## 2022-07-01 MED ORDER — LIDOCAINE-PRILOCAINE 2.5-2.5 % EX CREA: 1.0000 | TOPICAL_CREAM | CUTANEOUS | Status: DC | PRN

## 2022-07-01 MED ORDER — ACETAMINOPHEN 325 MG PO TABS
650.0000 mg | ORAL_TABLET | Freq: Four times a day (QID) | ORAL | Status: DC | PRN
  Administered 2022-07-02 – 2022-07-08 (×6): 650 mg via ORAL
  Filled 2022-07-01 (×3): qty 2

## 2022-07-01 MED ORDER — METRONIDAZOLE 500 MG PO TABS
500.0000 mg | ORAL_TABLET | Freq: Two times a day (BID) | ORAL | Status: DC
  Administered 2022-07-01 – 2022-07-04 (×8): 500 mg via ORAL
  Filled 2022-07-01 (×8): qty 1

## 2022-07-01 MED ORDER — OXYCODONE-ACETAMINOPHEN 5-325 MG PO TABS
1.0000 | ORAL_TABLET | ORAL | Status: DC | PRN
  Filled 2022-07-01: qty 1

## 2022-07-01 MED ORDER — GUAIFENESIN ER 600 MG PO TB12
1200.0000 mg | ORAL_TABLET | Freq: Two times a day (BID) | ORAL | Status: DC
  Administered 2022-07-02 – 2022-07-08 (×13): 1200 mg via ORAL
  Filled 2022-07-01 (×15): qty 2

## 2022-07-01 MED ORDER — CHLORHEXIDINE GLUCONATE CLOTH 2 % EX PADS
6.0000 | MEDICATED_PAD | Freq: Every day | CUTANEOUS | Status: DC
  Administered 2022-07-02 – 2022-07-08 (×7): 6 via TOPICAL

## 2022-07-01 MED ORDER — SODIUM CHLORIDE 0.9 % IV SOLN
2.0000 g | INTRAVENOUS | Status: DC
  Administered 2022-07-01 – 2022-07-03 (×3): 2 g via INTRAVENOUS
  Filled 2022-07-01 (×4): qty 20

## 2022-07-01 MED ORDER — INSULIN GLARGINE-YFGN 100 UNIT/ML ~~LOC~~ SOLN
30.0000 [IU] | Freq: Every day | SUBCUTANEOUS | Status: DC
  Filled 2022-07-01: qty 0.3

## 2022-07-01 MED ORDER — ASPIRIN 81 MG PO TBEC
81.0000 mg | DELAYED_RELEASE_TABLET | Freq: Every day | ORAL | Status: DC
  Administered 2022-07-02 – 2022-07-08 (×6): 81 mg via ORAL
  Filled 2022-07-01 (×7): qty 1

## 2022-07-01 MED ORDER — ACETAMINOPHEN 650 MG RE SUPP: 650.0000 mg | Freq: Four times a day (QID) | RECTAL | Status: DC | PRN

## 2022-07-01 MED ORDER — INSULIN ASPART 100 UNIT/ML IJ SOLN
0.0000 [IU] | Freq: Three times a day (TID) | INTRAMUSCULAR | Status: DC
  Administered 2022-07-02: 4 [IU] via SUBCUTANEOUS
  Administered 2022-07-03: 3 [IU] via SUBCUTANEOUS
  Administered 2022-07-04: 4 [IU] via SUBCUTANEOUS
  Administered 2022-07-04: 7 [IU] via SUBCUTANEOUS
  Administered 2022-07-04: 3 [IU] via SUBCUTANEOUS

## 2022-07-01 MED ORDER — ALTEPLASE 2 MG IJ SOLR: 2.0000 mg | Freq: Once | INTRAMUSCULAR | Status: DC | PRN

## 2022-07-01 MED ORDER — SENNOSIDES-DOCUSATE SODIUM 8.6-50 MG PO TABS
1.0000 | ORAL_TABLET | Freq: Two times a day (BID) | ORAL | Status: DC
  Administered 2022-07-02 – 2022-07-06 (×5): 1 via ORAL
  Filled 2022-07-01 (×8): qty 1

## 2022-07-01 MED ORDER — SUCROFERRIC OXYHYDROXIDE 500 MG PO CHEW
500.0000 mg | CHEWABLE_TABLET | Freq: Three times a day (TID) | ORAL | Status: DC
  Administered 2022-07-02 – 2022-07-06 (×7): 500 mg via ORAL
  Filled 2022-07-01 (×23): qty 1

## 2022-07-01 MED ORDER — FUROSEMIDE 40 MG PO TABS
40.0000 mg | ORAL_TABLET | Freq: Every day | ORAL | Status: DC
  Administered 2022-07-02 – 2022-07-03 (×3): 40 mg via ORAL
  Filled 2022-07-01 (×3): qty 1

## 2022-07-01 MED ORDER — SODIUM CHLORIDE 0.9 % IV SOLN
250.0000 mL | INTRAVENOUS | Status: DC | PRN
Start: 2022-07-01 — End: 2022-07-09

## 2022-07-01 MED ORDER — HEPARIN SODIUM (PORCINE) 5000 UNIT/ML IJ SOLN
5000.0000 [IU] | Freq: Three times a day (TID) | INTRAMUSCULAR | Status: DC
  Administered 2022-07-02 – 2022-07-08 (×17): 5000 [IU] via SUBCUTANEOUS
  Filled 2022-07-01 (×16): qty 1

## 2022-07-01 MED ORDER — HEPARIN SODIUM (PORCINE) 1000 UNIT/ML DIALYSIS
1000.0000 [IU] | INTRAMUSCULAR | Status: DC | PRN
  Administered 2022-07-03: 6000 [IU] via INTRAVENOUS_CENTRAL
  Filled 2022-07-01 (×2): qty 1

## 2022-07-01 MED ORDER — PANTOPRAZOLE SODIUM 40 MG PO TBEC
40.0000 mg | DELAYED_RELEASE_TABLET | Freq: Every day | ORAL | Status: DC
  Administered 2022-07-02 – 2022-07-08 (×7): 40 mg via ORAL
  Filled 2022-07-01 (×7): qty 1

## 2022-07-01 MED ORDER — ACETAMINOPHEN 325 MG PO TABS
650.0000 mg | ORAL_TABLET | Freq: Once | ORAL | Status: AC
  Administered 2022-07-01: 650 mg via ORAL
  Filled 2022-07-01: qty 2

## 2022-07-01 MED ORDER — ONDANSETRON HCL 4 MG PO TABS
4.0000 mg | ORAL_TABLET | Freq: Three times a day (TID) | ORAL | Status: DC | PRN
Start: 2022-07-01 — End: 2022-07-09

## 2022-07-01 MED ORDER — ANTICOAGULANT SODIUM CITRATE 4% (200MG/5ML) IV SOLN: 5.0000 mL | Status: DC | PRN

## 2022-07-01 MED ORDER — SODIUM CHLORIDE 0.9% FLUSH
3.0000 mL | INTRAVENOUS | Status: DC | PRN
Start: 2022-07-01 — End: 2022-07-09

## 2022-07-01 MED ORDER — VANCOMYCIN HCL 2000 MG/400ML IV SOLN
2000.0000 mg | Freq: Once | INTRAVENOUS | Status: AC
  Administered 2022-07-01: 2000 mg via INTRAVENOUS
  Filled 2022-07-01: qty 400

## 2022-07-01 MED ORDER — AMLODIPINE BESYLATE 5 MG PO TABS
5.0000 mg | ORAL_TABLET | Freq: Every morning | ORAL | Status: DC
  Administered 2022-07-02: 5 mg via ORAL
  Filled 2022-07-01 (×3): qty 1

## 2022-07-01 MED ORDER — HEPARIN SODIUM (PORCINE) 1000 UNIT/ML DIALYSIS: 1000.0000 [IU] | INTRAMUSCULAR | Status: DC | PRN

## 2022-07-01 NOTE — Consult Note (Signed)
ESRD Consult Note  Assessment/Recommendations:   ESRD:  -outpatient HD orders: [facility] TTS. 4hrs 69min. F180. Flow rates: 450/500. EDW 109kg. 2K/2.25Cal. AVF. Meds: Heparin 6000 unit bolus; Mircera 292mcg q2weeks (last dose 8/12); Venofer 50mg  qweekly -will attempt to get him dialyzed at [facility] to keep him on his TTS schedule. Will need carelink to be transported to and from  Wound infection/cellulitis -abx per primary service  Volume/ hypertension:  -BP acceptable. UF as tolerated  Anemia of Chronic Kidney Disease:  -next dose of ESA due on 8/26 -avoid IV Fe for now -transfuse prn for hgb <7  Secondary Hyperparathyroidism/Hyperphosphatemia: not on VDRA. Will resume home velphoro. Monitor PO4  DM2 -per primary  History of Present Illness: [name] MAUSS Sr. is a/an 61 y.o. male with a past medical history of ESRD, recent right BKA 7/7, CHF, h/o CVA, HTN, DM2 who presents with redness/drainage from amputation site, possible infection/cellulitis. Did not run on HD today. Patient does report some pain at wound site. Otherwise denies any chest pain, SOB, swelling, issues with AVF, n/v.   Medications:  Current Facility-Administered Medications  Medication Dose Route Frequency Provider Last Rate Last Admin   cefTRIAXone (ROCEPHIN) 2 g in sodium chloride 0.9 % 100 mL IVPB  2 g Intravenous Q24H Dorie Rank, MD 200 mL/hr at 07/01/22 1359 2 g at 07/01/22 1359   metroNIDAZOLE (FLAGYL) tablet 500 mg  500 mg Oral Q12H Dorie Rank, MD   500 mg at 07/01/22 1054   vancomycin (VANCOREADY) IVPB 2000 mg/400 mL  2,000 mg Intravenous Once Lynelle Doctor, RPH 200 mL/hr at 07/01/22 1358 2,000 mg at 07/01/22 1358   Current Outpatient Medications  Medication Sig Dispense Refill   amLODipine (NORVASC) 5 MG tablet Take 5 mg by mouth in the morning.     aspirin EC 81 MG EC tablet Take 1 tablet (81 mg total) by mouth daily. Swallow whole. 30 tablet 11   ethyl chloride spray Apply 1 Application topically Every  Tuesday,Thursday,and Saturday with dialysis.     famotidine (PEPCID) 20 MG tablet Take 20 mg by mouth at bedtime.     guaiFENesin (MUCINEX) 600 MG 12 hr tablet Take 1,200 mg by mouth 2 (two) times daily. Give 2 tablet by mouth every 12 hours for congestion for 30 days     insulin glargine (LANTUS) 100 UNIT/ML injection Inject 30 Units into the skin in the morning.     ipratropium-albuterol (DUONEB) 0.5-2.5 (3) MG/3ML SOLN Take 3 mLs by nebulization every 4 (four) hours as needed.     Multiple Vitamin (MULTIVITAMIN WITH MINERALS) TABS tablet Take 1 tablet by mouth every evening. (2100)     multivitamin (RENA-VIT) TABS tablet Take 1 tablet by mouth at bedtime. (Patient not taking: Reported on 05/22/2022)  0   Nutritional Supplements (,FEEDING SUPPLEMENT, PROSOURCE PLUS) liquid Take 30 mLs by mouth 2 (two) times daily between meals. (Patient not taking: Reported on 05/22/2022)     Nutritional Supplements (FEEDING SUPPLEMENT, NEPRO CARB STEADY,) LIQD Take 237 mLs by mouth 2 (two) times daily between meals. (Patient not taking: Reported on 05/22/2022)  0   ondansetron (ZOFRAN) 4 MG tablet Take by mouth.     oxyCODONE-acetaminophen (PERCOCET) 5-325 MG tablet Take 1 tablet by mouth every 4 (four) hours as needed for severe pain. 42 tablet 0   pantoprazole (PROTONIX) 40 MG tablet Take 1 tablet (40 mg total) by mouth daily at 12 noon.     senna-docusate (SENOKOT-S) 8.6-50 MG tablet Take 1 tablet  by mouth 2 (two) times daily.     sucroferric oxyhydroxide (VELPHORO) 500 MG chewable tablet Chew 1,000 mg by mouth 2 (two) times daily. (0900 & 1700)     sucroferric oxyhydroxide (VELPHORO) 500 MG chewable tablet Chew 1,000 mg by mouth See admin instructions. Give 2 tablets (1000 mg) by mouth one time a day every Mon, Wed, Fri, Sun with lunch       ALLERGIES Sulfa antibiotics  MEDICAL HISTORY Past Medical History:  Diagnosis Date   CHF (congestive heart failure) ([place])    Diabetes mellitus without complication ([place])     type 2   ESRD on hemodialysis ([place])    mon wed fri   Hypertension    Kidney failure    Myocardial infarction ([place])    Nonischemic cardiomyopathy ([place])      SOCIAL HISTORY Social History   Socioeconomic History   Marital status: Divorced    Spouse name: Not on file   Number of children: Not on file   Years of education: Not on file   Highest education level: Not on file  Occupational History   Not on file  Tobacco Use   Smoking status: Never   Smokeless tobacco: Never  Vaping Use   Vaping Use: Never used  Substance and Sexual Activity   Alcohol use: Not Currently   Drug use: Never   Sexual activity: Not on file  Other Topics Concern   Not on file  Social History Narrative   Not on file   Social Determinants of Health   Financial Resource Strain: Not on file  Food Insecurity: Not on file  Transportation Needs: Not on file  Physical Activity: Not on file  Stress: Not on file  Social Connections: Not on file  Intimate Partner Violence: Not on file     FAMILY HISTORY Family History  Problem Relation Age of Onset   Hypertension Mother      Review of Systems: 12 systems were reviewed and negative except per HPI  Physical Exam: Vitals:   07/01/22 1410 07/01/22 1430  BP: 137/77 128/75  Pulse: 87 84  Resp: (!) 21 (!) 33  Temp:    SpO2: 94% 97%   No intake/output data recorded. No intake or output data in the 24 hours ending 07/01/22 1455 General: well-appearing, no acute distress, laying flat in bed HEENT: anicteric sclera, MMM CV: normal rate, no murmurs, no edema Lungs: CTA b/l, bilateral chest rise, normal wob Abd: obese, soft, non-tender, non-distended Ext: wound rt stump, no edema LLE Neuro: normal speech, no gross focal deficits  Dialysis access: LUE AVF +b/t  Test Results Reviewed Lab Results  Component Value Date   NA 135 07/01/2022   K 4.7 07/01/2022   CL 95 (L) 07/01/2022   CO2 25 07/01/2022   BUN 76 (H) 07/01/2022   CREATININE  11.87 (H) 07/01/2022   CALCIUM 9.3 07/01/2022   ALBUMIN 2.6 (L) 07/01/2022   PHOS 4.9 (H) 05/24/2022    I have reviewed relevant outside healthcare records

## 2022-07-01 NOTE — H&P (Signed)
History and Physical    [name] WENDELL Sr. HCW:[id number] DOB: 13-Jan-1961 DOA: 07/01/2022  DOS: the patient was seen and examined on 07/01/2022  PCP: Pcp, No   Patient coming from: SNF  I have personally briefly reviewed patient's old medical records in [place]  [name] [name], a 61 y/o with a history of hypertension, cardiomyopathy, CHF, diabetes, renal failure dialysis.  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  Patient presents today for evaluation from the nursing facility of redness and drainage from his amputation site concerning for possible infection.  Patient had a temperature this morning.  He was scheduled to go to dialysis but did not go with the infection symptoms and was sent to the ED instead.  Patient denies any severe pain.  He is not aware that he has been having fevers or chills.  He denies any vomiting or diarrhea.     ED Course: Tmaz 100.9 - defervesced to 98.2  BP stable. Patient in no pain and no distress. Lab: WBC 13.3 with nl differential. Glucose 56. CXR with cardiomegaly and pulmonary congestion. In ED patient administered Vancomycin/Rocephin/ flagyl. Consult placed to nephrology. TRH called to admit for continued management.   Review of Systems:  Review of Systems  Constitutional:  Positive for chills and fever. Negative for weight loss.  HENT: Negative.    Eyes: Negative.   Respiratory: Negative.    Cardiovascular: Negative.   Gastrointestinal: Negative.   Genitourinary: Negative.   Musculoskeletal: Negative.   Skin:        Patient and nursing staff at SNF noted skin breakdown, erythema and drainage at right BKA stump.  Neurological: Negative.   Endo/Heme/Allergies: Negative.   Psychiatric/Behavioral: Negative.      Past Medical History:  Diagnosis Date   CHF (congestive heart failure) ([place])    ECHO 02/05/22  EF 45-50%, DD indeterminent   Diabetes mellitus without complication ([place])    type 2    ESRD on hemodialysis ([place])    mon wed fri   Hypertension    Kidney failure    Myocardial infarction ([place])    Nonischemic cardiomyopathy [facility])     Past Surgical History:  Procedure Laterality Date   A/V FISTULAGRAM N/A 08/15/2019   Procedure: A/V FISTULAGRAM;  Surgeon: [name], MD;  Location: [place] [facility];  Service: Cardiovascular;  Laterality: N/A;   AMPUTATION Right 02/02/2022   Procedure: AMPUTATION 5th RAY;  Surgeon: [name], MD;  Location: [place];  Service: Podiatry;  Laterality: Right;   AMPUTATION Right 05/23/2022   Procedure: RIGHT BELOW KNEE AMPUTATION;  Surgeon: [name], MD;  Location: [place];  Service: Orthopedics;  Laterality: Right;   APPLICATION OF WOUND VAC  05/23/2022   Procedure: APPLICATION OF WOUND VAC;  Surgeon: [name], MD;  Location: [place];  Service: Orthopedics;;   AV FISTULA PLACEMENT     CARDIAC CATHETERIZATION  02/17/2018   LHC 02/17/18 ([name]): 30% pLAD, 30% dRCA, LVEDP 25-30, LVEF 25%.     CATARACT EXTRACTION W/PHACO Right 03/29/2020   Procedure: CATARACT EXTRACTION PHACO AND INTRAOCULAR LENS PLACEMENT ([place])  RIGHT DIABETIC;  Surgeon: [name], MD;  Location: [facility];  Service: Ophthalmology;  Laterality: Right;  Lot # [id number] H [place]: 02:30.9 AP% 9.5% CDE: 14.38   EYE SURGERY Right    cataract removed   I & D EXTREMITY Right 02/02/2022   Procedure: IRRIGATION AND DEBRIDEMENT RIGHT FOOT;  Surgeon: Blenda Mounts,  Wells Guiles, MD;  Location: [place];  Service: Podiatry;  Laterality: Right;   INCISION AND DRAINAGE OF WOUND Right 02/08/2022   Procedure: IRRIGATION AND DEBRIDEMENT WOUND;  Surgeon: [name], DPM;  Location: [place];  Service: Podiatry;  Laterality: Right;  with removal of infected bone    INSERTION OF DIALYSIS CATHETER  08/13/2019   Procedure: Insertion Of Dialysis Catheter;  Surgeon: [name], MD;  Location: [place];  Service: Vascular;;   INSERTION OF DIALYSIS CATHETER Left 07/19/2020    Procedure: INSERTION OF DIALYSIS CATHETER;  Surgeon: [name], MD;  Location: [place];  Service: Vascular;  Laterality: Left;   IRRIGATION AND DEBRIDEMENT ABSCESS     PERIPHERAL VASCULAR BALLOON ANGIOPLASTY  08/15/2019   Procedure: PERIPHERAL VASCULAR BALLOON ANGIOPLASTY;  Surgeon: [name], MD;  Location: [place] [facility];  Service: Cardiovascular;;   PERIPHERAL VASCULAR BALLOON ANGIOPLASTY Left 02/06/2022   Procedure: PERIPHERAL VASCULAR BALLOON ANGIOPLASTY;  Surgeon: [name], MD;  Location: [place] [facility];  Service: Cardiovascular;  Laterality: Left;   REVISON OF ARTERIOVENOUS FISTULA Left 07/19/2020   Procedure: REVISON/PLICATION OF ARTERIOVENOUS FISTULA LEFT;  Surgeon: [name], MD;  Location: [facility];  Service: Vascular;  Laterality: Left;   THROMBECTOMY AND REVISION OF ARTERIOVENTOUS (AV) GORETEX  GRAFT Left 08/13/2019   Procedure: THROMBECTOMY OF LEFT ARM ARTERIOVENTOUS (AV) FISTULA;  Surgeon: [name], MD;  Location: [facility];  Service: Vascular;  Laterality: Left;   TOE AMPUTATION      Soc Hx- not married. Has 3 chldren. Worked Scientist, research (medical), ceiling tile, Architect. LOng-term on disabilty 2/2 DM and related co-morbidities.    reports that he has never smoked. He has never used smokeless tobacco. He reports that he does not currently use alcohol. He reports that he does not use drugs.  Allergies  Allergen Reactions   Sulfa Antibiotics Nausea And Vomiting    Family History  Problem Relation Age of Onset   Hypertension Mother     Prior to Admission medications   Medication Sig Start Date End Date Taking? Authorizing Provider  amLODipine (NORVASC) 5 MG tablet Take 5 mg by mouth in the morning.    [provider]  aspirin EC 81 MG EC tablet Take 1 tablet (81 mg total) by mouth daily. Swallow whole. 02/21/22   Raiford Noble Latif, DO  ethyl chloride spray Apply 1 Application topically Every  Tuesday,Thursday,and Saturday with dialysis.    [provider]  famotidine (PEPCID) 20 MG tablet Take 20 mg by mouth at bedtime.    [provider]  guaiFENesin (MUCINEX) 600 MG 12 hr tablet Take 1,200 mg by mouth 2 (two) times daily. Give 2 tablet by mouth every 12 hours for congestion for 30 days    [provider]  insulin glargine (LANTUS) 100 UNIT/ML injection Inject 30 Units into the skin in the morning.    [provider]  ipratropium-albuterol (DUONEB) 0.5-2.5 (3) MG/3ML SOLN Take 3 mLs by nebulization every 4 (four) hours as needed.    [provider]  Multiple Vitamin (MULTIVITAMIN WITH MINERALS) TABS tablet Take 1 tablet by mouth every evening. (2100)    [provider]  multivitamin (RENA-VIT) TABS tablet Take 1 tablet by mouth at bedtime. Patient not taking: Reported on 05/22/2022 02/20/22   Raiford Noble Latif, DO  Nutritional Supplements (,FEEDING SUPPLEMENT, PROSOURCE PLUS) liquid Take 30 mLs by mouth 2 (two) times daily between meals. Patient not taking: Reported on 05/22/2022 02/21/22  Sheikh, Omair Latif, DO  Nutritional Supplements (FEEDING SUPPLEMENT, NEPRO CARB STEADY,) LIQD Take 237 mLs by mouth 2 (two) times daily between meals. Patient not taking: Reported on 05/22/2022 02/21/22   Raiford Noble Latif, DO  ondansetron (ZOFRAN) 4 MG tablet Take by mouth. 06/30/22   [provider]  oxyCODONE-acetaminophen (PERCOCET) 5-325 MG tablet Take 1 tablet by mouth every 4 (four) hours as needed for severe pain. 05/26/22 05/26/23  Suzan Slick, NP  pantoprazole (PROTONIX) 40 MG tablet Take 1 tablet (40 mg total) by mouth daily at 12 noon. 02/21/22   Sheikh, Omair Latif, DO  senna-docusate (SENOKOT-S) 8.6-50 MG tablet Take 1 tablet by mouth 2 (two) times daily. 02/20/22   Raiford Noble Latif, DO  sucroferric oxyhydroxide (VELPHORO) 500 MG chewable tablet Chew 1,000 mg by mouth 2 (two) times daily. (0900 & 1700)    [provider]   sucroferric oxyhydroxide (VELPHORO) 500 MG chewable tablet Chew 1,000 mg by mouth See admin instructions. Give 2 tablets (1000 mg) by mouth one time a day every Mon, Wed, Fri, Sun with lunch    [provider]    Physical Exam: Vitals:   07/01/22 1320 07/01/22 1410 07/01/22 1430 07/01/22 1500  BP:  137/77 128/75 127/69  Pulse:  87 84 83  Resp:  (!) 21 (!) 33 (!) 34  Temp: 98.3 F (36.8 C)     TempSrc:      SpO2:  94% 97% (!) 87%    Physical Exam Vitals and nursing note reviewed.  Constitutional:      General: He is not in acute distress.    Appearance: He is obese. He is ill-appearing. He is not toxic-appearing or diaphoretic.  HENT:     Head: Normocephalic and atraumatic.     Mouth/Throat:     Mouth: Mucous membranes are moist.     Comments: Native dentition Eyes:     General:        Right eye: Discharge present.        Left eye: Discharge present.    Extraocular Movements: Extraocular movements intact.     Conjunctiva/sclera: Conjunctivae normal.     Pupils: Pupils are equal, round, and reactive to light.     Comments: Serosanguinous discharge both eyes with crusting of eyelids. Bulbar conjunctiva w/p erythema  Cardiovascular:     Rate and Rhythm: Normal rate and regular rhythm.     Comments: 2+ radial pulse, trace pulse left foot.  Pulmonary:     Effort: Pulmonary effort is normal.     Breath sounds: Normal breath sounds. No wheezing or rales.  Abdominal:     General: Bowel sounds are normal.     Palpations: Abdomen is soft.  Musculoskeletal:     Cervical back: Normal range of motion and neck supple.     Comments: Right BKA - at flap line some separation of skin with apparent erythema, beefy tissue, drainage, no odor.   Skin:    General: Skin is warm and dry.     Comments: Multiple tatoos. HD access left proximal UE-graft  Neurological:     General: No focal deficit present.     Mental Status: He is alert and oriented to person, place, and time.   Psychiatric:        Mood and Affect: Mood normal.        Behavior: Behavior normal.      Labs on Admission: I have personally reviewed following labs and imaging studies  CBC: Recent Labs  Lab 07/01/22 1344  WBC 13.3*  NEUTROABS 9.6*  HGB 9.3*  HCT 30.8*  MCV 79.0*  PLT 222   Basic Metabolic Panel: Recent Labs  Lab 07/01/22 1344  NA 135  K 4.7  CL 95*  CO2 25  GLUCOSE 56*  BUN 76*  CREATININE 11.87*  CALCIUM 9.3   GFR: CrCl cannot be calculated (Unknown ideal weight.). Liver Function Tests: Recent Labs  Lab 07/01/22 1344  AST 19  ALT 17  ALKPHOS 174*  BILITOT 2.1*  PROT 8.3*  ALBUMIN 2.6*   No results for input(s): "LIPASE", "AMYLASE" in the last 168 hours. No results for input(s): "AMMONIA" in the last 168 hours. Coagulation Profile: Recent Labs  Lab 07/01/22 1344  INR 1.4*   Cardiac Enzymes: No results for input(s): "CKTOTAL", "CKMB", "CKMBINDEX", "TROPONINI" in the last 168 hours. BNP (last 3 results) No results for input(s): "PROBNP" in the last 8760 hours. HbA1C: No results for input(s): "HGBA1C" in the last 72 hours. CBG: No results for input(s): "GLUCAP" in the last 168 hours. Lipid Profile: No results for input(s): "CHOL", "HDL", "LDLCALC", "TRIG", "CHOLHDL", "LDLDIRECT" in the last 72 hours. Thyroid Function Tests: No results for input(s): "TSH", "T4TOTAL", "FREET4", "T3FREE", "THYROIDAB" in the last 72 hours. Anemia Panel: No results for input(s): "VITAMINB12", "FOLATE", "FERRITIN", "TIBC", "IRON", "RETICCTPCT" in the last 72 hours. Urine analysis:    Component Value Date/Time   COLORURINE YELLOW 09/20/2018 2118   APPEARANCEUR CLEAR 09/20/2018 2118   LABSPEC 1.012 09/20/2018 2118   PHURINE 8.0 09/20/2018 2118   GLUCOSEU >=500 (A) 09/20/2018 2118   HGBUR NEGATIVE 09/20/2018 2118   BILIRUBINUR NEGATIVE 09/20/2018 2118   KETONESUR NEGATIVE 09/20/2018 2118   PROTEINUR >=300 (A) 09/20/2018 2118   NITRITE NEGATIVE 09/20/2018 2118    LEUKOCYTESUR SMALL (A) 09/20/2018 2118    Radiological Exams on Admission: I have personally reviewed images DG Knee Complete 4 Views Right  Result Date: 07/01/2022 CLINICAL DATA:  Possible infection after amputation. EXAM: RIGHT KNEE - COMPLETE 4+ VIEW COMPARISON:  None Available. FINDINGS: Status post right below-knee amputation. 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 Vascular calcifications are noted. No fracture or dislocation is noted. Expected postoperative changes are seen in the soft tissues of the stump. IMPRESSION: Expected postsurgical changes status post right below-knee amputation. Electronically Signed   By: Marijo Conception M.D.   On: 07/01/2022 11:01   DG Chest Port 1 View  Result Date: 07/01/2022 CLINICAL DATA:  Recent amputation of right lower extremity. Missed dialysis today. EXAM: PORTABLE CHEST 1 VIEW COMPARISON:  02/03/2022 FINDINGS: Aortic atherosclerosis. Stable cardiac enlargement. Lung volumes are low with asymmetric elevation of the right hemidiaphragm. Pulmonary vascular congestion. No signs of pleural effusion or interstitial edema. No airspace opacities. IMPRESSION: 1. Stable cardiac enlargement. 2. Pulmonary vascular congestion. 3. Low lung volumes. Electronically Signed   By: Kerby Moors M.D.   On: 07/01/2022 11:01    EKG: I have personally reviewed EKG: Sinus rhythm, LBBB, no acute changes/no STEMI  Assessment/Plan Principal Problem:   Cellulitis of right leg Active Problems:   DM2 (diabetes mellitus, type 2) ([facility])   ESRD (end stage renal disease) ([facility])   HTN (hypertension)   Chronic systolic CHF (congestive heart failure) ([facility])   GERD (gastroesophageal reflux disease)    Assessment and Plan: * Cellulitis of right leg S/p BKA right 5 months ago by his report. Now with erythema, drainage, fever c/w cellulitis.   Plan Wound care: cleanse BID and apply dry dressing  Vancomycin daily  Rocephin daily  DM2 (diabetes mellitus, type 2)  ([place]) Last A1C 05/24/22 8.2%.   Plan Continue basal insulin at home dose  SS  ESRD (end stage renal disease) ([place]) Long-term HD patient - since 1994. Usual schedule T,TH,Sat - missed today's HD due to infection and ED eval. Nephrology on board.  Plan Orders for HD written  Pt [facility] and [facility]  HTN (hypertension) BP stable  Plan Continue home regimen  Chronic systolic CHF (congestive heart failure) ([place]) Last ECHO 02/05/22: EF 45-50% LVH, global hypkinesis, Diastolic function indeterminent on last study. Appears well compensated but CXR with pulmonary congestion.  Plan Continue home medications  Dialysis to reduce fluid load.   GERD (gastroesophageal reflux disease) Patient voices no complaint.  Plan Continue PPI  D/c H2 blocker at this time       DVT prophylaxis: SQ Heparin Code Status: Full Code Family Communication: preferred contact: [place], daughter, [phone]. She is his advocate and involved in his care.   Disposition Plan: return to SNF when stable  Consults called: nephrology- Blair Hailey  Admission status: Inpatient, Med-Surg   Adella Hare, MD [facility] 07/01/2022, 3:50 PM

## 2022-07-01 NOTE — Assessment & Plan Note (Signed)
Last ECHO 02/05/22: EF 45-50% LVH, global hypkinesis, Diastolic function indeterminent on last study. Appears well compensated but CXR with pulmonary congestion.  Plan Continue home medications  Dialysis to reduce fluid load.

## 2022-07-01 NOTE — Assessment & Plan Note (Signed)
Patient voices no complaint.  Plan Continue PPI  D/c H2 blocker at this time

## 2022-07-01 NOTE — Assessment & Plan Note (Signed)
S/p BKA right 5 months ago by his report. Now with erythema, drainage, fever c/w cellulitis.   Plan Wound care: cleanse BID and apply dry dressing  Vancomycin daily  Rocephin daily

## 2022-07-01 NOTE — Progress Notes (Signed)
Received patient in bed to unit.  On going HD treatment. Alert and oriented.  Informed consent signed and in  chart.   Treatment initiated: 1800 Treatment completed: 2204  Patient tolerated well.  Transported back to [place]. Alert,oriented, without acute distress.  Hand-off given to patient's nurse.   Access used: Left AVF Access issues: none  Total UF removed: 3089ml Medication(s) given: None Post HD VS: BP 150/70mmHg, HR 97bpm, RR 19brpm, Sats 99% RA, T 98.26F  Post HD weight: 107.1kg   [facility]

## 2022-07-01 NOTE — Progress Notes (Signed)
A consult was received from an ED physician for vancomycin per pharmacy dosing.  The patient's profile has been reviewed for ht/wt/allergies/indication/available labs.    - ESRD on HD TTSat  A one time order has been placed for vancomycin 2000 mg IV x1.  Further antibiotics/pharmacy consults should be ordered by admitting physician if indicated.                       Thank you, Lynelle Doctor 07/01/2022  10:26 AM

## 2022-07-01 NOTE — ED Provider Notes (Signed)
[facility] DEPT Provider Note   CSN: [id number] Arrival date & time: 07/01/22  0865     History  Chief Complaint  Patient presents with  . Wound Infection    [name] Cruel Sr. is a 61 y.o. male.  HPI   Patient has a history of hypertension, cardiomyopathy, CHF, diabetes, renal failure dialysis.  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  This was performed on July 7.  Patient presents today for evaluation from the nursing facility of redness and drainage from his amputation site concerning for possible infection.  Patient had a temperature this morning.  He was scheduled to go to dialysis but did not go with the infection symptoms and was sent to the ED instead.  Patient denies any severe pain.  He is not aware that he has been having fevers or chills.  He denies any vomiting or diarrhea.  Home Medications Prior to Admission medications   Medication Sig Start Date End Date Taking? Authorizing Provider  amLODipine (NORVASC) 5 MG tablet Take 5 mg by mouth in the morning.    [provider]  aspirin EC 81 MG EC tablet Take 1 tablet (81 mg total) by mouth daily. Swallow whole. 02/21/22   Raiford Noble Latif, DO  ethyl chloride spray Apply 1 Application topically Every Tuesday,Thursday,and Saturday with dialysis.    [provider]  famotidine (PEPCID) 20 MG tablet Take 20 mg by mouth at bedtime.    [provider]  guaiFENesin (MUCINEX) 600 MG 12 hr tablet Take 1,200 mg by mouth 2 (two) times daily. Give 2 tablet by mouth every 12 hours for congestion for 30 days    [provider]  insulin glargine (LANTUS) 100 UNIT/ML injection Inject 30 Units into the skin in the morning.    [provider]  ipratropium-albuterol (DUONEB) 0.5-2.5 (3) MG/3ML SOLN Take 3 mLs by nebulization every 4 (four) hours as needed.    [provider]  Multiple Vitamin (MULTIVITAMIN WITH MINERALS)  TABS tablet Take 1 tablet by mouth every evening. (2100)    [provider]  multivitamin (RENA-VIT) TABS tablet Take 1 tablet by mouth at bedtime. Patient not taking: Reported on 05/22/2022 02/20/22   Raiford Noble Latif, DO  Nutritional Supplements (,FEEDING SUPPLEMENT, PROSOURCE PLUS) liquid Take 30 mLs by mouth 2 (two) times daily between meals. Patient not taking: Reported on 05/22/2022 02/21/22   Raiford Noble Latif, DO  Nutritional Supplements (FEEDING SUPPLEMENT, NEPRO CARB STEADY,) LIQD Take 237 mLs by mouth 2 (two) times daily between meals. Patient not taking: Reported on 05/22/2022 02/21/22   Raiford Noble Latif, DO  ondansetron (ZOFRAN) 4 MG tablet Take by mouth. 06/30/22   [provider]  oxyCODONE-acetaminophen (PERCOCET) 5-325 MG tablet Take 1 tablet by mouth every 4 (four) hours as needed for severe pain. 05/26/22 05/26/23  Suzan Slick, NP  pantoprazole (PROTONIX) 40 MG tablet Take 1 tablet (40 mg total) by mouth daily at 12 noon. 02/21/22   Sheikh, Omair Latif, DO  senna-docusate (SENOKOT-S) 8.6-50 MG tablet Take 1 tablet by mouth 2 (two) times daily. 02/20/22   Raiford Noble Latif, DO  sucroferric oxyhydroxide (VELPHORO) 500 MG chewable tablet Chew 1,000 mg by mouth 2 (two) times daily. (0900 & 1700)    [provider]  sucroferric oxyhydroxide (VELPHORO) 500 MG chewable tablet Chew 1,000 mg by mouth See admin instructions. Give 2 tablets (1000 mg) by mouth one time a day every  Mon, Wed, Fri, Sun with lunch    [provider]      Allergies    Sulfa antibiotics    Review of Systems   Review of Systems  Physical Exam Updated Vital Signs BP 128/75   Pulse 84   Temp 98.3 F (36.8 C)   Resp (!) 33   SpO2 97%  Physical Exam Vitals and nursing note reviewed.  Constitutional:      Appearance: He is well-developed. He is not diaphoretic.  HENT:     Head: Normocephalic and atraumatic.     Right Ear: External ear normal.     Left Ear: External ear  normal.  Eyes:     General: No scleral icterus.       Right eye: No discharge.        Left eye: No discharge.     Conjunctiva/sclera: Conjunctivae normal.  Neck:     Trachea: No tracheal deviation.  Cardiovascular:     Rate and Rhythm: Normal rate and regular rhythm.  Pulmonary:     Effort: Pulmonary effort is normal. No respiratory distress.     Breath sounds: Normal breath sounds. No stridor. No wheezing, rhonchi or rales.  Abdominal:     General: There is no distension.     Tenderness: There is no abdominal tenderness.  Musculoskeletal:        General: Swelling present. No deformity.     Cervical back: Neck supple.     Comments: Erythema noted around the incision as well as the stump of his below the knee amputation on the right leg, no crepitus  Skin:    General: Skin is warm and dry.     Findings: No rash.  Neurological:     General: No focal deficit present.     Mental Status: He is alert.     Cranial Nerves: Cranial nerve deficit: no gross deficits.     Sensory: No sensory deficit.     Motor: No weakness.     ED Results / Procedures / Treatments   Labs (all labs ordered are listed, but only abnormal results are displayed) Labs Reviewed  COMPREHENSIVE METABOLIC PANEL - Abnormal; Notable for the following components:      Result Value   Chloride 95 (*)    Glucose, Bld 56 (*)    BUN 76 (*)    Creatinine, Ser 11.87 (*)    Total Protein 8.3 (*)    Albumin 2.6 (*)    Alkaline Phosphatase 174 (*)    Total Bilirubin 2.1 (*)    GFR, Estimated 4 (*)    All other components within normal limits  CBC WITH DIFFERENTIAL/PLATELET - Abnormal; Notable for the following components:   WBC 13.3 (*)    RBC 3.90 (*)    Hemoglobin 9.3 (*)    HCT 30.8 (*)    MCV 79.0 (*)    MCH 23.8 (*)    RDW 22.2 (*)    nRBC 0.3 (*)    Neutro Abs 9.6 (*)    Monocytes Absolute 2.2 (*)    All other components within normal limits  PROTIME-INR - Abnormal; Notable for the following  components:   Prothrombin Time 17.1 (*)    INR 1.4 (*)    All other components within normal limits  APTT - Abnormal; Notable for the following components:   aPTT 37 (*)    All other components within normal limits  SARS CORONAVIRUS 2 BY RT PCR  CULTURE, BLOOD (ROUTINE X  2)  CULTURE, BLOOD (ROUTINE X 2)  URINE CULTURE  LACTIC ACID, PLASMA  LACTIC ACID, PLASMA  URINALYSIS, ROUTINE W REFLEX MICROSCOPIC    EKG EKG Interpretation  Date/Time:  Tuesday July 01 2022 09:56:07 EDT Ventricular Rate:  104 PR Interval:  179 QRS Duration: 127 QT Interval:  368 QTC Calculation: 484 R Axis:   -72 Text Interpretation: Sinus tachycardia with irregular rate Left bundle branch block lateral st-tt wave changes noted compared to prior tracing Confirmed by Dorie Rank [phone]) on 07/01/2022 10:04:33 AM  Radiology DG Knee Complete 4 Views Right  Result Date: 07/01/2022 CLINICAL DATA:  Possible infection after amputation. EXAM: RIGHT KNEE - COMPLETE 4+ VIEW COMPARISON:  None Available. FINDINGS: Status post right below-knee amputation. 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 Vascular calcifications are noted. No fracture or dislocation is noted. Expected postoperative changes are seen in the soft tissues of the stump. IMPRESSION: Expected postsurgical changes status post right below-knee amputation. Electronically Signed   By: Marijo Conception M.D.   On: 07/01/2022 11:01   DG Chest Port 1 View  Result Date: 07/01/2022 CLINICAL DATA:  Recent amputation of right lower extremity. Missed dialysis today. EXAM: PORTABLE CHEST 1 VIEW COMPARISON:  02/03/2022 FINDINGS: Aortic atherosclerosis. Stable cardiac enlargement. Lung volumes are low with asymmetric elevation of the right hemidiaphragm. Pulmonary vascular congestion. No signs of pleural effusion or interstitial edema. No airspace opacities. IMPRESSION: 1. Stable cardiac enlargement. 2. Pulmonary vascular congestion. 3. Low lung volumes.  Electronically Signed   By: Kerby Moors M.D.   On: 07/01/2022 11:01    Procedures Procedures    Medications Ordered in ED Medications  cefTRIAXone (ROCEPHIN) 2 g in sodium chloride 0.9 % 100 mL IVPB (2 g Intravenous New Bag/Given 07/01/22 1359)  metroNIDAZOLE (FLAGYL) tablet 500 mg (500 mg Oral Given 07/01/22 1054)  vancomycin (VANCOREADY) IVPB 2000 mg/400 mL (2,000 mg Intravenous New Bag/Given 07/01/22 1358)  acetaminophen (TYLENOL) tablet 650 mg (650 mg Oral Given 07/01/22 1054)    ED Course/ Medical Decision Making/ A&P Clinical Course as of 07/01/22 1459  Tue Jul 01, 2022  1419 Knee x-ray and chest x-ray without acute changes [JK]  1438 Labs notable for a leukocytosis..  No signs of lactic acidosis. [JK]  1017 Metabolic panel consistent with chronic kidney disease but no signs of acute electrolyte abnormality requiring emergent dialysis [JK]  1439 Hyperbilirubinemia noted.   [JK]  1458 Case discussed with Dr. Linda Hedges regarding admission [JK]  1458 Case discussed with Dr. Candiss Norse, nephrology [JK]    Clinical Course User Index [JK] Dorie Rank, MD                           Medical Decision Making Patient noted to have fever and appears to have a wound infection around his distal stump.  Patient did also have an occasional cough at the bedside.  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 Decision-making details documented in ED Course. Radiology: ordered and independent interpretation performed. Decision-making details documented in ED  Course.  Risk OTC drugs. Prescription drug  management. Drug therapy requiring intensive monitoring for toxicity. Decision regarding hospitalization.   Patient presented to the ED with concerns of lower extremity wound infection following an amputation of his right lower extremity in July.  Patient does have erythema surrounding the wound.  No signs of osteomyelitis on x-ray.  Patient did have a fever here and he does have a leukocytosis.  At this time no signs of severe evolving sepsis.  He is normotensive and does not have a lactic acidosis.  Patient is at risk for worsening symptoms and I think he would benefit from hospitalization and IV antibiotics.  Patient also did not go to dialysis today because of his infection and will need to have dialysis.  I will consult with the medical service for admission.  Patient currently is at [facility] and will need to be transferred to [facility] as there is no dialysis available here.         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

## 2022-07-01 NOTE — ED Notes (Signed)
Unable obtain Second set of blood cultures, Provider aware and notified.

## 2022-07-01 NOTE — Progress Notes (Signed)
Pharmacy Antibiotic Note  [name] [name]. is a 61 y.o. 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  Pharmacy has been consulted to dose vancomycin for infection.  Pt s/p HD at [facility].  Plan: -Vancomycin 1000mg  x1  ___________________________________  Temp (24hrs), Avg:99.2 F (37.3 C), Min:98.3 F (36.8 C), Max:100.9 F (38.3 C)  Recent Labs  Lab 07/01/22 1344  WBC 13.3*  CREATININE 11.87*  LATICACIDVEN 1.3     Estimated Creatinine Clearance: 8.6 mL/min (A) (by C-G formula based on SCr of 11.87 mg/dL (H)).    Allergies  Allergen Reactions   Sulfa Antibiotics Nausea And Vomiting and Other (See Comments)    "Allergic," per [facility]    Thank you for allowing pharmacy to be a part of this patient's care.  Einar Grad 07/01/2022 10:32 PM

## 2022-07-01 NOTE — ED Provider Triage Note (Signed)
Emergency Medicine Provider Triage Evaluation Note  [name] Cruel Sr. , a 61 y.o. male  was evaluated in triage.  Pt complains of pain in the palm of the right hand (is left hand dominant).. Per EMS, from [facility], recent amputation, sent here for possible wound infection. Missed dialysis today. Febrile temp 100.9  Review of Systems  Positive:  Negative:   Physical Exam  BP (!) 146/81 (BP Location: Right Arm)   Pulse (!) 102   Temp (!) 100.9 F (38.3 C) (Oral)   Resp 20   SpO2 99%  Gen:   Awake, no distress   Resp:  Normal effort  MSK:   Moves extremities without difficulty  Other:    Medical Decision Making  Medically screening exam initiated at 9:27 AM.  Appropriate orders placed.  [name] Cruel Sr. was informed that the remainder of the evaluation will be completed by another provider, this initial triage assessment does not replace that evaluation, and the importance of remaining in the ED until their evaluation is complete.     Tacy Learn, PA-C 07/01/22 [phone]

## 2022-07-01 NOTE — Subjective & Objective (Signed)
[name] [name], a 61 y/o with a history of hypertension, cardiomyopathy, CHF, diabetes, renal failure dialysis.  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  Patient presents today for evaluation from the nursing facility of redness and drainage from his amputation site concerning for possible infection.  Patient had a temperature this morning.  He was scheduled to go to dialysis but did not go with the infection symptoms and was sent to the ED instead.  Patient denies any severe pain.  He is not aware that he has been having fevers or chills.  He denies any vomiting or diarrhea.

## 2022-07-01 NOTE — Assessment & Plan Note (Signed)
Long-term HD patient - since 89. Usual schedule T,TH,Sat - missed today's HD due to infection and ED eval. Nephrology on board.  Plan Orders for HD written  Pt will be transported to [facility] for HD and returned to [facility] afterwards

## 2022-07-01 NOTE — ED Notes (Signed)
Called [facility] to transport this patient to [facility] hemodialysis.per Susie,RN.

## 2022-07-01 NOTE — Assessment & Plan Note (Signed)
Last A1C 05/24/22 8.2%.   Plan Continue basal insulin at home dose  SS

## 2022-07-01 NOTE — Assessment & Plan Note (Signed)
BP stable.  Plan Continue home regimen 

## 2022-07-01 NOTE — ED Triage Notes (Addendum)
Pt BIB EMS from [facility]. Pt had a recent amputation of R lower extremity. Per doctor there pt was sent here for further evaluation of wound from amputation. Also pt missed dialysis today.

## 2022-07-01 NOTE — Progress Notes (Addendum)
Pharmacy Antibiotic Note  [name] [name]. is a 61 y.o. 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  Pharmacy has been consulted to dose vancomycin for infection.   Plan: - vancomycin 2000 mg IV x1 given in the ED.  Will give 1000 mg after each HD. - pt did not receive his HD session today PTA.  Plan is to transfer pt to [facility] for HD. - ceftriaxone 2gm q24h per MD  ___________________________________  Temp (24hrs), Avg:99.6 F (37.6 C), Min:98.3 F (36.8 C), Max:100.9 F (38.3 C)  Recent Labs  Lab 07/01/22 1344  WBC 13.3*  CREATININE 11.87*  LATICACIDVEN 1.3    CrCl cannot be calculated (Unknown ideal weight.).    Allergies  Allergen Reactions   Sulfa Antibiotics Nausea And Vomiting    Thank you for allowing pharmacy to be a part of this patient's care.  Lynelle Doctor 07/01/2022 4:42 PM

## 2022-07-02 DIAGNOSIS — L8990 Pressure ulcer of unspecified site, unspecified stage: Secondary | ICD-10-CM | POA: Insufficient documentation

## 2022-07-02 DIAGNOSIS — L03115 Cellulitis of right lower limb: Secondary | ICD-10-CM | POA: Diagnosis not present

## 2022-07-02 LAB — BASIC METABOLIC PANEL
Anion gap: 16 — ABNORMAL HIGH (ref 5–15)
BUN: 42 mg/dL — ABNORMAL HIGH (ref 6–20)
CO2: 29 mmol/L (ref 22–32)
Calcium: 9.4 mg/dL (ref 8.9–10.3)
Chloride: 90 mmol/L — ABNORMAL LOW (ref 98–111)
Creatinine, Ser: 7.34 mg/dL — ABNORMAL HIGH (ref 0.61–1.24)
GFR, Estimated: 8 mL/min — ABNORMAL LOW (ref >60)
Glucose, Bld: 82 mg/dL (ref 70–99)
Potassium: 3.6 mmol/L (ref 3.5–5.1)
Sodium: 135 mmol/L (ref 135–145)

## 2022-07-02 LAB — GLUCOSE, CAPILLARY
Glucose-Capillary: 177 mg/dL — ABNORMAL HIGH (ref 70–99)
Glucose-Capillary: 177 mg/dL — ABNORMAL HIGH (ref 70–99)
Glucose-Capillary: 183 mg/dL — ABNORMAL HIGH (ref 70–99)
Glucose-Capillary: 183 mg/dL — ABNORMAL HIGH (ref 70–99)
Glucose-Capillary: 66 mg/dL — ABNORMAL LOW (ref 70–99)
Glucose-Capillary: 80 mg/dL (ref 70–99)

## 2022-07-02 LAB — MRSA NEXT GEN BY PCR, NASAL: MRSA by PCR Next Gen: DETECTED — AB

## 2022-07-02 LAB — CBC
HCT: 37.1 % — ABNORMAL LOW (ref 39.0–52.0)
Hemoglobin: 11.1 g/dL — ABNORMAL LOW (ref 13.0–17.0)
MCH: 24.1 pg — ABNORMAL LOW (ref 26.0–34.0)
MCHC: 29.9 g/dL — ABNORMAL LOW (ref 30.0–36.0)
MCV: 80.5 fL (ref 80.0–100.0)
Platelets: 342 10*3/uL (ref 150–400)
RBC: 4.61 MIL/uL (ref 4.22–5.81)
RDW: 22.4 % — ABNORMAL HIGH (ref 11.5–15.5)
WBC: 14 10*3/uL — ABNORMAL HIGH (ref 4.0–10.5)
nRBC: 0.4 % — ABNORMAL HIGH (ref 0.0–0.2)

## 2022-07-02 LAB — RENAL FUNCTION PANEL
Albumin: 3.1 g/dL — ABNORMAL LOW (ref 3.5–5.0)
Anion gap: 16 — ABNORMAL HIGH (ref 5–15)
BUN: 40 mg/dL — ABNORMAL HIGH (ref 6–20)
CO2: 29 mmol/L (ref 22–32)
Calcium: 9.4 mg/dL (ref 8.9–10.3)
Chloride: 90 mmol/L — ABNORMAL LOW (ref 98–111)
Creatinine, Ser: 7.26 mg/dL — ABNORMAL HIGH (ref 0.61–1.24)
GFR, Estimated: 8 mL/min — ABNORMAL LOW (ref >60)
Glucose, Bld: 81 mg/dL (ref 70–99)
Phosphorus: 3.4 mg/dL (ref 2.5–4.6)
Potassium: 3.6 mmol/L (ref 3.5–5.1)
Sodium: 135 mmol/L (ref 135–145)

## 2022-07-02 LAB — HEPATITIS B SURFACE ANTIGEN: Hepatitis B Surface Ag: NONREACTIVE

## 2022-07-02 LAB — HEPATITIS B CORE ANTIBODY, TOTAL: Hep B Core Total Ab: NONREACTIVE

## 2022-07-02 LAB — HEPATITIS B SURFACE ANTIBODY,QUALITATIVE: Hep B S Ab: REACTIVE — AB

## 2022-07-02 LAB — HEPATITIS C ANTIBODY: HCV Ab: NONREACTIVE

## 2022-07-02 LAB — LACTIC ACID, PLASMA: Lactic Acid, Venous: 1.7 mmol/L (ref 0.5–1.9)

## 2022-07-02 MED ORDER — BENZONATATE 100 MG PO CAPS
100.0000 mg | ORAL_CAPSULE | Freq: Three times a day (TID) | ORAL | Status: DC
  Administered 2022-07-02 – 2022-07-08 (×17): 100 mg via ORAL
  Filled 2022-07-02 (×17): qty 1

## 2022-07-02 MED ORDER — INSULIN GLARGINE-YFGN 100 UNIT/ML ~~LOC~~ SOLN
25.0000 [IU] | Freq: Every day | SUBCUTANEOUS | Status: DC
  Administered 2022-07-02 – 2022-07-04 (×2): 25 [IU] via SUBCUTANEOUS
  Filled 2022-07-02 (×4): qty 0.25

## 2022-07-02 NOTE — Inpatient Diabetes Management (Signed)
Inpatient Diabetes Program Recommendations  AACE/ADA: New Consensus Statement on Inpatient Glycemic Control (2015)  Target Ranges:  Prepandial:   less than 140 mg/dL      Peak postprandial:   less than 180 mg/dL (1-2 hours)      Critically ill patients:  140 - 180 mg/dL   Lab Results  Component Value Date   GLUCAP 80 07/02/2022   HGBA1C 8.2 (H) 05/24/2022    Review of Glycemic Control  Diabetes history: DM2 Outpatient Diabetes medications: Lantus 30 QHS Current orders for Inpatient glycemic control: Semglee 30 QHS, Novolog 0-20 TID with meals  Inpatient Diabetes Program Recommendations:    Consider decreasing Semglee to 25 units QHS d/t morning hypos.  Will follow trends.  Thank you. [name], RD, LDN, [place] Inpatient Diabetes Coordinator [phone]

## 2022-07-02 NOTE — Progress Notes (Signed)
Dressing to [place] changed due to moderate amount of red drainage to gauze. Daughter present.

## 2022-07-02 NOTE — Progress Notes (Signed)
[facility] Progress Note    Assessment/ Plan:   ESRD:  -outpatient HD orders: [place] [facility] TTS. 4hrs 62min. F180. Flow rates: 450/500. EDW 109kg. 2K/2.25Cal. AVF. Meds: Heparin 6000 unit bolus; Mircera 231mcg q2weeks (last dose 8/12); Venofer 50mg  qweekly -will c/w HD on TTS schedule, will need transport to [facility] for this   Wound infection/cellulitis -abx per primary service, ortho consulted   Volume/ hypertension:  -BP acceptable. UF as tolerated   Anemia of Chronic Kidney Disease:  -next dose of ESA due on 8/26, hgb 11.1 -avoid IV Fe for now -transfuse prn for hgb <7   Secondary Hyperparathyroidism/Hyperphosphatemia: not on VDRA. Will resume home velphoro. PO4 acceptable/WNL   DM2 -per primary  Diarrhea -Per primary, stool studies pending  Subjective:   Patient seen and examined in room. Reports that he tolerated HD yesterday with net UF of 3L. Has been having loose stool/diarrhea.   Objective:   BP 121/64 (BP Location: Right Arm)   Pulse 86   Temp 99.2 F (37.3 C) (Oral)   Resp 18   Wt 103.1 kg   SpO2 100%   BMI 29.99 kg/m   Intake/Output Summary (Last 24 hours) at 07/02/2022 1308 Last data filed at 07/02/2022 0600 Gross per 24 hour  Intake 778.56 ml  Output 3000 ml  Net -2221.44 ml   Weight change:   Physical Exam: Gen:NAD CVS:RRR Resp:normal WOB JTT:SVXB, nt/nd Ext:no sig edema LLE, RLE stump dressing in place Neuro: awake, alert Dialysis access: LUE AVF +b/t  Imaging: DG Knee Complete 4 Views Right  Result Date: 07/01/2022 CLINICAL DATA:  Possible infection after amputation. EXAM: RIGHT KNEE - COMPLETE 4+ VIEW COMPARISON:  None Available. FINDINGS: Status post right below-knee amputation. 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 Vascular calcifications are noted. No fracture or dislocation is noted. Expected postoperative changes are seen in the soft tissues of the stump. IMPRESSION: Expected postsurgical changes status  post right below-knee amputation. Electronically Signed   By: Marijo Conception M.D.   On: 07/01/2022 11:01   DG Chest Port 1 View  Result Date: 07/01/2022 CLINICAL DATA:  Recent amputation of right lower extremity. Missed dialysis today. EXAM: PORTABLE CHEST 1 VIEW COMPARISON:  02/03/2022 FINDINGS: Aortic atherosclerosis. Stable cardiac enlargement. Lung volumes are low with asymmetric elevation of the right hemidiaphragm. Pulmonary vascular congestion. No signs of pleural effusion or interstitial edema. No airspace opacities. IMPRESSION: 1. Stable cardiac enlargement. 2. Pulmonary vascular congestion. 3. Low lung volumes. Electronically Signed   By: Kerby Moors M.D.   On: 07/01/2022 11:01    Labs: BMET Recent Labs  Lab 07/01/22 1344 07/02/22 0100  NA 135 135  135  K 4.7 3.6  3.6  CL 95* 90*  90*  CO2 25 29  29   GLUCOSE 56* 82  81  BUN 76* 42*  40*  CREATININE 11.87* 7.34*  7.26*  CALCIUM 9.3 9.4  9.4  PHOS  --  3.4   CBC Recent Labs  Lab 07/01/22 1344 07/02/22 0100  WBC 13.3* 14.0*  NEUTROABS 9.6*  --   HGB 9.3* 11.1*  HCT 30.8* 37.1*  MCV 79.0* 80.5  PLT 304 342    Medications:     amLODipine  5 mg Oral q AM   aspirin EC  81 mg Oral Daily   benzonatate  100 mg Oral TID   Chlorhexidine Gluconate Cloth  6 each Topical Q0600   furosemide  40 mg Oral Daily   guaiFENesin  1,200 mg Oral  BID   heparin  5,000 Units Subcutaneous Q8H   insulin aspart  0-20 Units Subcutaneous TID WC   insulin glargine-yfgn  25 Units Subcutaneous QHS   metroNIDAZOLE  500 mg Oral Q12H   pantoprazole  40 mg Oral Daily   senna-docusate  1 tablet Oral BID   sodium chloride flush  3 mL Intravenous Q12H   sucroferric oxyhydroxide  500 mg Oral TID WC   vancomycin variable dose per unstable renal function (pharmacist dosing)   Does not apply See admin instructions      Gean Quint, MD [place] 07/02/2022, 1:08 PM

## 2022-07-02 NOTE — Progress Notes (Signed)
PROGRESS NOTE  [name] BEYENE Sr.  DOB: [birth year]  PCP: Kathyrn Lass HLK:[id number]  DOA: 07/01/2022  LOS: 1 day  Hospital Day: 2  Brief narrative: [name] Cruel Sr. is a 61 y.o. male with PMH significant for ESRD-HD-TTS, DM2, HTN, HLD, CAD/MI, recent right BKA Patient was brought to the ED on 8/15 from [facility] for evaluation of redness, drainage from his amputation site concerning for possible infection.   Patient recently underwent right BKA on 7/7 by Dr. Sharol Given for right foot osteomyelitis.  While at the rehab, he was noted to have progressive worsening of redness, drainage and pain at the site of amputation and hence sent to the ED.  In the ED, he had an initial temperature of 100.9, heart rate 102, blood pressure 146/81, 90% O2 sat on room air. Labs showed WBC of 13.3, hemoglobin 9.3, BNP more than 1600, lactic acid 1.3, glucose low at 56. CXR with cardiomegaly and pulmonary congestion.   In the ED, patient was started on vancomycin/Rocephin/ flagyl.   Admitted to hospitalist service.  Subjective: Patient was seen and examined this morning.  Middle-aged African-American male.  Lying on bed.  Not in distress Per RN, patient is having multiple episodes of loose stool  Assessment and plan: Amputation stump infection S/p right BKA 7/7, sent from SNF with erythema, drainage, fever c/w cellulitis.  Currently on IV Rocephin, IV vancomycin Consult request sent to Dr. Sharol Given.  Multiple loose stool Low-grade fever this morning. Obtain C. difficile and GI pathogen panel  ESRD-HD-TTS Missed his dialysis yesterday while in the ED Nephrology on board.  Patient was transported to [facility] for hemodialysis last night.  He was brought back to [facility] because of inpatient bed scarcity at [facility].  He will be transferred back to [facility] once bed is available  Uncontrolled type 2 diabetes mellitus Hypoglycemia A1c 8.2 on 05/24/2022.  In last 24 hours, blood sugar has been  running mostly low, as low as 51 last night. PTA on Lantus 30 units daily Currently on sliding scale insulin.  Diabetes care coordinator consult appreciated.  Will resume Lantus from tonight at 25 units. Recent Labs  Lab 07/01/22 2348 07/02/22 0012 07/02/22 0038 07/02/22 0741 07/02/22 1203  GLUCAP 51* 66* 80 177* 183*   Chronic systolic CHF  Essential hypertension Echo 02/05/2022 with EF 45 to 50%, LVH, global hypokinesis  Blood pressure stable PTA on amlodipine 5 mg daily.  Continue the same  History of CAD/MI Continue aspirin  GERD  Continue PPI   Goals of care   Code Status: Full Code    Mobility: Needs PT eval postprocedure  Skin assessment:  Pressure Injury 07/02/22 Buttocks Left;Medial Stage 3 -  Full thickness tissue loss. Subcutaneous fat may be visible but bone, tendon or muscle are NOT exposed. (Active)  07/02/22 0058  Location: Buttocks  Location Orientation: Left;Medial  Staging: Stage 3 -  Full thickness tissue loss. Subcutaneous fat may be visible but bone, tendon or muscle are NOT exposed.  Wound Description (Comments):   Present on Admission: Yes    Nutritional status:  Body mass index is 29.99 kg/m.          Diet:  Diet Order             Diet heart healthy/carb modified Room service appropriate? Yes; Fluid consistency: Thin  Diet effective now                   DVT prophylaxis:  heparin injection 5,000 Units Start: 07/02/22 0600   Antimicrobials: IV Rocephin and vancomycin Fluid: None Consultants: Orthopedics consulted Family Communication: None at bedside  Status is: Inpatient  Continue in-hospital care because: Needs IV antibiotics, may need surgical intervention Level of care: Med-Surg.  Cardiac monitoring ordered because of significant cardiac history.  Dispo: The patient is from: Rehab              Anticipated d/c is to: Back to rehab hopefully              Patient currently is not medically stable to d/c.   Difficult  to place patient No     Infusions:   sodium chloride     anticoagulant sodium citrate     cefTRIAXone (ROCEPHIN)  IV 2 g (07/01/22 1359)    Scheduled Meds:  amLODipine  5 mg Oral q AM   aspirin EC  81 mg Oral Daily   benzonatate  100 mg Oral TID   Chlorhexidine Gluconate Cloth  6 each Topical Q0600   furosemide  40 mg Oral Daily   guaiFENesin  1,200 mg Oral BID   heparin  5,000 Units Subcutaneous Q8H   insulin aspart  0-20 Units Subcutaneous TID WC   insulin glargine-yfgn  25 Units Subcutaneous QHS   metroNIDAZOLE  500 mg Oral Q12H   pantoprazole  40 mg Oral Daily   senna-docusate  1 tablet Oral BID   sodium chloride flush  3 mL Intravenous Q12H   sucroferric oxyhydroxide  500 mg Oral TID WC   vancomycin variable dose per unstable renal function (pharmacist dosing)   Does not apply See admin instructions    PRN meds: sodium chloride, acetaminophen **OR** acetaminophen, alteplase, anticoagulant sodium citrate, heparin, heparin, ipratropium-albuterol, lidocaine (PF), lidocaine-prilocaine, ondansetron, oxyCODONE-acetaminophen, pentafluoroprop-tetrafluoroeth, sodium chloride flush   Antimicrobials: Anti-infectives (From admission, onward)    Start     Dose/Rate Route Frequency Ordered Stop   07/01/22 2330  vancomycin (VANCOCIN) IVPB 1000 mg/200 mL premix        1,000 mg 200 mL/hr over 60 Minutes Intravenous  Once 07/01/22 2234 07/02/22 0236   07/01/22 2234  vancomycin variable dose per unstable renal function (pharmacist dosing)         Does not apply See admin instructions 07/01/22 2234     07/01/22 1045  vancomycin (VANCOREADY) IVPB 2000 mg/400 mL        2,000 mg 200 mL/hr over 120 Minutes Intravenous  Once 07/01/22 1032 07/01/22 1558   07/01/22 1030  cefTRIAXone (ROCEPHIN) 2 g in sodium chloride 0.9 % 100 mL IVPB        2 g 200 mL/hr over 30 Minutes Intravenous Every 24 hours 07/01/22 1023 07/08/22 1359   07/01/22 1030  metroNIDAZOLE (FLAGYL) tablet 500 mg        500 mg  Oral Every 12 hours 07/01/22 1023 07/08/22 0959       Objective: Vitals:   07/02/22 0624 07/02/22 1003  BP: 115/63 121/64  Pulse: 92 86  Resp: 18 18  Temp: (!) 100.5 F (38.1 C) 99.2 F (37.3 C)  SpO2: 93% 100%    Intake/Output Summary (Last 24 hours) at 07/02/2022 1247 Last data filed at 07/02/2022 0600 Gross per 24 hour  Intake 778.56 ml  Output 3000 ml  Net -2221.44 ml   Filed Weights   07/01/22 1746 07/02/22 0500  Weight: 107.1 kg 103.1 kg   Weight change:  Body mass index is 29.99 kg/m.   Physical Exam: General  exam: Pleasant middle-aged African-American male.  Not in physical distress Skin: No rashes, lesions or ulcers. HEENT: Atraumatic, normocephalic, no obvious bleeding Lungs: Clear to auscultation bilaterally CVS: Regular rate and rhythm, no murmur GI/Abd soft, nontender, nondistended, bowel sound present CNS: Alert, awake, oriented x3 Psychiatry: Sad affect Extremities: Left leg without edema or tenderness.  Right wrist and amputation stump bandaged  Data Review: I have personally reviewed the laboratory data and studies available.  F/u labs  Unresulted Labs (From admission, onward)     Start     Ordered   07/03/22 0500  CBC with Differential/Platelet  Tomorrow morning,   R       Question:  Specimen collection method  Answer:  IV Team=IV Team collect   07/02/22 1247   07/03/22 0175  Basic metabolic panel  Tomorrow morning,   R       Question:  Specimen collection method  Answer:  IV Team=IV Team collect   07/02/22 1247   07/02/22 1247  Gastrointestinal Panel by PCR , Stool  (Gastrointestinal Panel by PCR, Stool                                                                                                                                                     **Does Not include CLOSTRIDIUM DIFFICILE testing. **If CDIFF testing is needed, place order from the "C Difficile Testing" order set.**)  Once,   R        07/02/22 1246   07/02/22 1146  C Difficile  Quick Screen w PCR reflex  (C Difficile quick screen w PCR reflex panel )  Once, for 24 hours,   TIMED       References:    CDiff Information Tool   07/02/22 1145   07/01/22 1512  Hepatitis B surface antibody,quantitative  (New Admission Hemo Labs (Hepatitis B))  Once,   URGENT       Question:  Specimen collection method  Answer:  IV Team=IV Team collect   07/01/22 1518   07/01/22 0931  Blood Culture (routine x 2)  (Undifferentiated presentation (screening labs and basic nursing orders))  BLOOD CULTURE X 2,   STAT      07/01/22 0930   07/01/22 0931  Urinalysis, Routine w reflex microscopic  (Undifferentiated presentation (screening labs and basic nursing orders))  ONCE - URGENT,   URGENT        07/01/22 0930   07/01/22 0931  Urine Culture  (Undifferentiated presentation (screening labs and basic nursing orders))  ONCE - URGENT,   URGENT       Question:  Indication  Answer:  Sepsis   07/01/22 0930            Signed, Terrilee Croak, MD [facility] 07/02/2022

## 2022-07-03 DIAGNOSIS — L089 Local infection of the skin and subcutaneous tissue, unspecified: Secondary | ICD-10-CM

## 2022-07-03 DIAGNOSIS — T148XXA Other injury of unspecified body region, initial encounter: Secondary | ICD-10-CM | POA: Diagnosis not present

## 2022-07-03 DIAGNOSIS — N186 End stage renal disease: Secondary | ICD-10-CM | POA: Diagnosis not present

## 2022-07-03 DIAGNOSIS — L02415 Cutaneous abscess of right lower limb: Secondary | ICD-10-CM

## 2022-07-03 DIAGNOSIS — T8781 Dehiscence of amputation stump: Secondary | ICD-10-CM

## 2022-07-03 DIAGNOSIS — L03115 Cellulitis of right lower limb: Secondary | ICD-10-CM | POA: Diagnosis not present

## 2022-07-03 LAB — URINALYSIS, ROUTINE W REFLEX MICROSCOPIC
Bilirubin Urine: NEGATIVE
Glucose, UA: NEGATIVE mg/dL
Hgb urine dipstick: NEGATIVE
Ketones, ur: NEGATIVE mg/dL
Leukocytes,Ua: NEGATIVE
Nitrite: NEGATIVE
Protein, ur: >=300 mg/dL — AB
Specific Gravity, Urine: 1.017 (ref 1.005–1.030)
pH: 7 (ref 5.0–8.0)

## 2022-07-03 LAB — GLUCOSE, CAPILLARY
Glucose-Capillary: 110 mg/dL — ABNORMAL HIGH (ref 70–99)
Glucose-Capillary: 144 mg/dL — ABNORMAL HIGH (ref 70–99)
Glucose-Capillary: 105 mg/dL — ABNORMAL HIGH (ref 70–99)

## 2022-07-03 LAB — CBC WITH DIFFERENTIAL/PLATELET
Abs Immature Granulocytes: 0.05 10*3/uL (ref 0.00–0.07)
Basophils Absolute: 0.1 10*3/uL (ref 0.0–0.1)
Basophils Relative: 1 %
Eosinophils Absolute: 0.8 10*3/uL — ABNORMAL HIGH (ref 0.0–0.5)
Eosinophils Relative: 8 %
HCT: 31.8 % — ABNORMAL LOW (ref 39.0–52.0)
Hemoglobin: 9.3 g/dL — ABNORMAL LOW (ref 13.0–17.0)
Immature Granulocytes: 1 %
Lymphocytes Relative: 11 %
Lymphs Abs: 1.1 10*3/uL (ref 0.7–4.0)
MCH: 23.7 pg — ABNORMAL LOW (ref 26.0–34.0)
MCHC: 29.2 g/dL — ABNORMAL LOW (ref 30.0–36.0)
MCV: 81.1 fL (ref 80.0–100.0)
Monocytes Absolute: 1.7 10*3/uL — ABNORMAL HIGH (ref 0.1–1.0)
Monocytes Relative: 17 %
Neutro Abs: 6.3 10*3/uL (ref 1.7–7.7)
Neutrophils Relative %: 62 %
Platelets: 291 10*3/uL (ref 150–400)
RBC: 3.92 MIL/uL — ABNORMAL LOW (ref 4.22–5.81)
RDW: 22 % — ABNORMAL HIGH (ref 11.5–15.5)
WBC: 10 10*3/uL (ref 4.0–10.5)
nRBC: 0.6 % — ABNORMAL HIGH (ref 0.0–0.2)

## 2022-07-03 LAB — GASTROINTESTINAL PANEL BY PCR, STOOL (REPLACES STOOL CULTURE)

## 2022-07-03 LAB — BASIC METABOLIC PANEL
Anion gap: 11 (ref 5–15)
BUN: 50 mg/dL — ABNORMAL HIGH (ref 6–20)
CO2: 26 mmol/L (ref 22–32)
Calcium: 8.6 mg/dL — ABNORMAL LOW (ref 8.9–10.3)
Chloride: 98 mmol/L (ref 98–111)
Creatinine, Ser: 8.54 mg/dL — ABNORMAL HIGH (ref 0.61–1.24)
GFR, Estimated: 7 mL/min — ABNORMAL LOW (ref >60)
Glucose, Bld: 134 mg/dL — ABNORMAL HIGH (ref 70–99)
Potassium: 3.2 mmol/L — ABNORMAL LOW (ref 3.5–5.1)
Sodium: 135 mmol/L (ref 135–145)

## 2022-07-03 LAB — C DIFFICILE QUICK SCREEN W PCR REFLEX
C Diff antigen: NEGATIVE
C Diff interpretation: NOT DETECTED
C Diff toxin: NEGATIVE

## 2022-07-03 LAB — HEPATITIS B SURFACE ANTIBODY, QUANTITATIVE: Hep B S AB Quant (Post): 374.6 m[IU]/mL (ref >9.9)

## 2022-07-03 MED ORDER — CHLORHEXIDINE GLUCONATE 4 % EX LIQD
60.0000 mL | Freq: Once | CUTANEOUS | Status: AC
Start: 2022-07-04 — End: 2022-07-04
  Administered 2022-07-04: 4 via TOPICAL
  Filled 2022-07-03: qty 60

## 2022-07-03 MED ORDER — VANCOMYCIN HCL IN DEXTROSE 1-5 GM/200ML-% IV SOLN
1000.0000 mg | INTRAVENOUS | Status: DC
  Administered 2022-07-05 – 2022-07-08 (×2): 1000 mg via INTRAVENOUS
  Filled 2022-07-03 (×4): qty 200

## 2022-07-03 MED ORDER — VANCOMYCIN HCL IN DEXTROSE 1-5 GM/200ML-% IV SOLN
1000.0000 mg | Freq: Once | INTRAVENOUS | Status: AC
  Administered 2022-07-03: 1000 mg via INTRAVENOUS
  Filled 2022-07-03: qty 200

## 2022-07-03 MED ORDER — SODIUM CHLORIDE 0.9% IV SOLUTION
Freq: Once | INTRAVENOUS | Status: AC
Start: 2022-07-04 — End: 2022-07-04

## 2022-07-03 MED ORDER — JUVEN PO PACK
1.0000 | PACK | Freq: Two times a day (BID) | ORAL | Status: DC
  Filled 2022-07-03 (×2): qty 1

## 2022-07-03 MED ORDER — POVIDONE-IODINE 10 % EX SWAB
2.0000 | Freq: Once | CUTANEOUS | Status: AC
  Administered 2022-07-04: 2 via TOPICAL

## 2022-07-03 MED ORDER — PROSOURCE PLUS PO LIQD
30.0000 mL | Freq: Two times a day (BID) | ORAL | Status: DC
  Administered 2022-07-03 – 2022-07-08 (×9): 30 mL via ORAL
  Filled 2022-07-03 (×10): qty 30

## 2022-07-03 MED ORDER — RENA-VITE PO TABS
1.0000 | ORAL_TABLET | Freq: Every day | ORAL | Status: DC
  Administered 2022-07-03 – 2022-07-07 (×5): 1 via ORAL
  Filled 2022-07-03 (×6): qty 1

## 2022-07-03 MED ORDER — NEPRO/CARBSTEADY PO LIQD
237.0000 mL | Freq: Two times a day (BID) | ORAL | Status: DC
  Administered 2022-07-03 – 2022-07-07 (×7): 237 mL via ORAL
  Filled 2022-07-03: qty 237

## 2022-07-03 NOTE — Consult Note (Signed)
ORTHOPAEDIC CONSULTATION  REQUESTING PHYSICIAN: [name], [name], MD  Chief Complaint: Purulent abscess right BKA  HPI: [name] [name]. is a 61 y.o. male who presents with end-stage renal disease on dialysis with uncontrolled type 2 diabetes status post right transtibial amputation.  Patient has had progressive dehiscence with purulent drainage of the transtibial amputation.  Past Medical History:  Diagnosis Date   CHF (congestive heart failure) ([name])    ECHO 02/05/22  EF 45-50%, DD indeterminent   Diabetes mellitus without complication ([name])    type 2   ESRD on hemodialysis ([name])    mon wed fri   Hypertension    Kidney failure    Myocardial infarction ([name])    Nonischemic cardiomyopathy [name])    Past Surgical History:  Procedure Laterality Date   A/V FISTULAGRAM N/A 08/15/2019   Procedure: A/V FISTULAGRAM;  Surgeon: [name], MD;  Location: [place] CV [facility];  Service: Cardiovascular;  Laterality: N/A;   AMPUTATION Right 02/02/2022   Procedure: AMPUTATION 5th RAY;  Surgeon: [name], MD;  Location: [place];  Service: Podiatry;  Laterality: Right;   AMPUTATION Right 05/23/2022   Procedure: RIGHT BELOW KNEE AMPUTATION;  Surgeon: [name], MD;  Location: [place];  Service: Orthopedics;  Laterality: Right;   APPLICATION OF WOUND VAC  05/23/2022   Procedure: APPLICATION OF WOUND VAC;  Surgeon: [name], MD;  Location: [place];  Service: Orthopedics;;   AV FISTULA PLACEMENT     CARDIAC CATHETERIZATION  02/17/2018   LHC 02/17/18 ([name]): 30% pLAD, 30% dRCA, LVEDP 25-30, LVEF 25%.     CATARACT EXTRACTION W/PHACO Right 03/29/2020   Procedure: CATARACT EXTRACTION PHACO AND INTRAOCULAR LENS PLACEMENT ([name])  RIGHT DIABETIC;  Surgeon: [name], MD;  Location: [facility];  Service: Ophthalmology;  Laterality: Right;  Lot # [id number] H [place]: 02:30.9 AP% 9.5% CDE: 14.38   EYE SURGERY Right    cataract removed   I & D EXTREMITY Right 02/02/2022    Procedure: IRRIGATION AND DEBRIDEMENT RIGHT FOOT;  Surgeon: [name], MD;  Location: [place];  Service: Podiatry;  Laterality: Right;   INCISION AND DRAINAGE OF WOUND Right 02/08/2022   Procedure: IRRIGATION AND DEBRIDEMENT WOUND;  Surgeon: [name], DPM;  Location: [place];  Service: Podiatry;  Laterality: Right;  with removal of infected bone    INSERTION OF DIALYSIS CATHETER  08/13/2019   Procedure: Insertion Of Dialysis Catheter;  Surgeon: [name], MD;  Location: [place];  Service: Vascular;;   INSERTION OF DIALYSIS CATHETER Left 07/19/2020   Procedure: INSERTION OF DIALYSIS CATHETER;  Surgeon: [name], MD;  Location: [place];  Service: Vascular;  Laterality: Left;   IRRIGATION AND DEBRIDEMENT ABSCESS     PERIPHERAL VASCULAR BALLOON ANGIOPLASTY  08/15/2019   Procedure: PERIPHERAL VASCULAR BALLOON ANGIOPLASTY;  Surgeon: [name], MD;  Location: [place] [facility];  Service: Cardiovascular;;   PERIPHERAL VASCULAR BALLOON ANGIOPLASTY Left 02/06/2022   Procedure: PERIPHERAL VASCULAR BALLOON ANGIOPLASTY;  Surgeon: [name], MD;  Location: [place] [facility];  Service: Cardiovascular;  Laterality: Left;   REVISON OF ARTERIOVENOUS FISTULA Left 07/19/2020   Procedure: REVISON/PLICATION OF ARTERIOVENOUS FISTULA LEFT;  Surgeon: [name], MD;  Location: [facility];  Service: Vascular;  Laterality: Left;   THROMBECTOMY AND REVISION OF ARTERIOVENTOUS (AV) GORETEX  GRAFT Left 08/13/2019   Procedure: THROMBECTOMY OF LEFT ARM ARTERIOVENTOUS (AV) FISTULA;  Surgeon: [name], MD;  Location: [place];  Service: Vascular;  Laterality: Left;   TOE AMPUTATION     Social History   Socioeconomic History   Marital status: Divorced    Spouse name: Not on file   Number of children: Not on file   Years of education: Not on file   Highest education level: Not on file  Occupational History   Not on file  Tobacco Use   Smoking status: Never    Smokeless tobacco: Never  Vaping Use   Vaping Use: Never used  Substance and Sexual Activity   Alcohol use: Not Currently   Drug use: Never   Sexual activity: Not on file  Other Topics Concern   Not on file  Social History Narrative   Not on file   Social Determinants of Health   Financial Resource Strain: Not on file  Food Insecurity: Not on file  Transportation Needs: Not on file  Physical Activity: Not on file  Stress: Not on file  Social Connections: Not on file   Family History  Problem Relation Age of Onset   Hypertension Mother    - negative except otherwise stated in the family history section Allergies  Allergen Reactions   Sulfa Antibiotics Nausea And Vomiting and Other (See Comments)    "Allergic," per [facility]   Prior to Admission medications   Medication Sig Start Date End Date Taking? Authorizing Provider  Amino Acids-Protein Hydrolys (FEEDING SUPPLEMENT, PRO-STAT SUGAR FREE 64,) LIQD Take 30 mLs by mouth 2 (two) times daily.   Yes [provider]  Amino Acids-Protein Hydrolys (PRO-STAT AWC) LIQD Take 30 mLs by mouth 2 (two) times daily.   Yes [provider]  amLODipine (NORVASC) 5 MG tablet Take 5 mg by mouth in the morning.   Yes [provider]  aspirin EC 81 MG EC tablet Take 1 tablet (81 mg total) by mouth daily. Swallow whole. 02/21/22  Yes Sheikh, Omair Latif, DO  benzonatate (TESSALON) 200 MG capsule Take 200 mg by mouth See admin instructions. Take 200 mg by mouth at 8 AM, 2 PM, and 8 PM on Sun/Mon/Wed/Fri and 200 mg at 8 AM and 5 PM every Tues/Thurs/Sat   Yes [provider]  ethyl chloride spray Apply 1 Application topically Every Tuesday,Thursday,and Saturday with dialysis.   Yes [provider]  famotidine (PEPCID) 20 MG tablet Take 20 mg by mouth at bedtime.   Yes [provider]  guaiFENesin (ROBITUSSIN) 100 MG/5ML liquid Take 10 mLs by mouth every 4 (four) hours as needed for cough.   Yes [provider]  insulin glargine (LANTUS) 100 UNIT/ML injection Inject 30 Units into the skin at bedtime.   Yes [provider]  ipratropium-albuterol (DUONEB) 0.5-2.5 (3) MG/3ML SOLN Take 3 mLs by nebulization every 4 (four) hours as needed (for wheezing or shortness of breath).   Yes [provider]  Nutritional Supplements (NEPRO) LIQD Take by mouth 2 (two) times daily.   Yes [provider]  ondansetron (ZOFRAN) 4 MG tablet Take 4 mg by mouth every 6 (six) hours as needed for nausea or vomiting. 06/30/22  Yes [provider]  oxyCODONE-acetaminophen (PERCOCET) 5-325 MG tablet Take 1 tablet by mouth every 4 (four) hours as needed for severe pain. 05/26/22 05/26/23 Yes Suzan Slick, NP  pantoprazole (PROTONIX) 40 MG tablet Take 1 tablet (40 mg total) by mouth daily at 12 noon. Patient taking differently: Take 40 mg by mouth daily before breakfast. 02/21/22  Yes Sheikh, Omair Latif, DO  senna-docusate (SENOKOT-S) 8.6-50 MG  tablet Take 1 tablet by mouth 2 (two) times daily. 02/20/22  Yes Sheikh, Omair Latif, DO  sucroferric oxyhydroxide (VELPHORO) 500 MG chewable tablet Chew 1,000 mg by mouth See admin instructions. Chew 1,000 mg by mouth at 9 AM and 5 PM on daily and an additional 1,000 mg at noon only on Mon/Wed/Fri   Yes [provider]  sucroferric oxyhydroxide (VELPHORO) 500 MG chewable tablet Chew 1,000 mg by mouth See admin instructions. Chew 1,000 mg by mouth one time a day every Mon, Wed, Fri, Sun with lunch   Yes [provider]  guaiFENesin (MUCINEX) 600 MG 12 hr tablet Take 1,200 mg by mouth 2 (two) times daily. Give 2 tablet by mouth every 12 hours for congestion for 30 days Patient not taking: Reported on 07/01/2022    [provider]  multivitamin (RENA-VIT) TABS tablet Take 1 tablet by mouth at bedtime. Patient not taking: Reported on 05/22/2022 02/20/22   Raiford Noble Latif, DO  Nutritional Supplements (,FEEDING SUPPLEMENT,  PROSOURCE PLUS) liquid Take 30 mLs by mouth 2 (two) times daily between meals. Patient not taking: Reported on 05/22/2022 02/21/22   Raiford Noble Latif, DO  Nutritional Supplements (FEEDING SUPPLEMENT, NEPRO CARB STEADY,) LIQD Take 237 mLs by mouth 2 (two) times daily between meals. Patient not taking: Reported on 05/22/2022 02/21/22   Raiford Noble Latif, DO   No results found. - pertinent xrays, CT, MRI studies were reviewed and independently interpreted  Positive ROS: All other systems have been reviewed and were otherwise negative with the exception of those mentioned in the HPI and as above.  Physical Exam: General: Alert, no acute distress Psychiatric: Patient is competent for consent with normal mood and affect Lymphatic: No axillary or cervical lymphadenopathy Cardiovascular: No pedal edema Respiratory: No cyanosis, no use of accessory musculature GI: No organomegaly, abdomen is soft and non-tender    Images:  @ENCIMAGES @  Labs:  Lab Results  Component Value Date   HGBA1C 8.2 (H) 05/24/2022   HGBA1C 12.2 (H) 02/02/2022   HGBA1C 11.9 (H) 02/02/2022   ESRSEDRATE 85 (H) 02/02/2022   CRP 33.6 (H) 02/02/2022   CRP 29.8 (H) 02/02/2022   REPTSTATUS PENDING 07/02/2022   GRAMSTAIN  02/02/2022    RARE WBC PRESENT,BOTH PMN AND MONONUCLEAR ABUNDANT GRAM VARIABLE ROD ABUNDANT GRAM POSITIVE COCCI    CULT  07/02/2022    NO GROWTH 1 DAY Performed at [facility], [place] [address] [place], [place] [address]    LABORGA [facility] MORGANII 02/02/2022   LABORGA ENTEROCOCCUS FAECALIS 02/02/2022    Lab Results  Component Value Date   ALBUMIN 3.1 (L) 07/02/2022   ALBUMIN 2.6 (L) 07/01/2022   ALBUMIN 2.2 (L) 05/24/2022   PREALBUMIN 9.5 (L) 02/02/2022   PREALBUMIN 10.9 (L) 02/02/2022        Latest Ref Rng & Units 07/03/2022    3:22 AM 07/02/2022    1:00 AM 07/01/2022    1:44 PM  CBC EXTENDED  WBC 4.0 - 10.5 K/uL 10.0  14.0  13.3   RBC 4.22 - 5.81 MIL/uL 3.92  4.61  3.90    Hemoglobin 13.0 - 17.0 g/dL 9.3  11.1  9.3   HCT 39.0 - 52.0 % 31.8  37.1  30.8   Platelets 150 - 400 K/uL 291  342  304   NEUT# 1.7 - 7.7 K/uL 6.3   9.6   Lymph# 0.7 - 4.0 K/uL 1.1   0.9     Neurologic: Patient does not have protective  sensation bilateral lower extremities.   MUSCULOSKELETAL:   Skin: Examination the left foot has a good dorsalis pedis pulse there are no ulcers on the left foot or left leg.  Examination of the right transtibial amputation there is purulent drainage that extends down to bone.  Discussed patient's white blood cell count is improved from 14-10.  Hemoglobin has dropped from 11-9.3.  Albumin consistent with protein caloric malnutrition.  No ascending cellulitis.  Assessment:  Uncontrolled type 2 diabetes end-stage renal disease on dialysis with a purulent abscess right transtibial amputation.  Plan: Plan: Patient states he would not consider an amputation above the knee.  We will plan for revision of the right transtibial amputation.  We will plan for surgery tomorrow.  Patient is completing dialysis at this time.  Most likely will contact [facility] to bring him over for surgery.  I will type and cross for blood.  Thank you for the consult and the opportunity to see Mr. Victorious Kundinger, [place] [phone] 5:47 PM

## 2022-07-03 NOTE — Progress Notes (Addendum)
  [name] Progress Note    Assessment/ Plan:   ESRD:  -outpatient HD orders: [place] TTS. 4hrs 43min. F180. Flow rates: 450/500. EDW 109kg. 2K/2.25Cal. AVF. Meds: Heparin 6000 unit bolus; Mircera 270mcg q2weeks (last dose 8/12); Venofer 50mg  qweekly -will c/w HD on TTS schedule, will need transport to [facility] for this. Discussed with [facility] HD unit, will be on for 2nd shift   Wound infection/cellulitis -abx per primary service, ortho consulted   Volume/ hypertension:  -BP acceptable. UF as tolerated   Anemia of Chronic Kidney Disease:  -next dose of ESA due on 8/26, hgb 11.1 -avoid IV Fe for now -transfuse prn for hgb <7   Secondary Hyperparathyroidism/Hyperphosphatemia: not on VDRA. Will resume home velphoro. PO4 acceptable/WNL   DM2 -per primary  Diarrhea -Per primary, cdiff neg  Subjective:   Patient seen and examined in room. No acute events. Reports feeling well. No complaints.   Objective:   BP 118/70 (BP Location: Right Arm)   Pulse 81   Temp 98.2 F (36.8 C) (Oral)   Resp 17   Wt 103.9 kg   SpO2 93%   BMI 30.22 kg/m   Intake/Output Summary (Last 24 hours) at 07/03/2022 1159 Last data filed at 07/03/2022 0600 Gross per 24 hour  Intake 220 ml  Output --  Net 220 ml   Weight change: -3.2 kg  Physical Exam: Gen:NAD CVS:RRR Resp:normal WOB UXL:KGMW, nt/nd Ext:no sig edema LLE, RLE stump dressing in place Neuro: awake, alert Dialysis access: LUE AVF +b/t  Imaging: No results found.  Labs: BMET Recent Labs  Lab 07/01/22 1344 07/02/22 0100 07/03/22 0322  NA 135 135  135 135  K 4.7 3.6  3.6 3.2*  CL 95* 90*  90* 98  CO2 25 29  29 26   GLUCOSE 56* 82  81 134*  BUN 76* 42*  40* 50*  CREATININE 11.87* 7.34*  7.26* 8.54*  CALCIUM 9.3 9.4  9.4 8.6*  PHOS  --  3.4  --    CBC Recent Labs  Lab 07/01/22 1344 07/02/22 0100 07/03/22 0322  WBC 13.3* 14.0* 10.0  NEUTROABS 9.6*  --  6.3  HGB 9.3* 11.1* 9.3*  HCT 30.8* 37.1*  31.8*  MCV 79.0* 80.5 81.1  PLT 304 342 291    Medications:     amLODipine  5 mg Oral q AM   aspirin EC  81 mg Oral Daily   benzonatate  100 mg Oral TID   Chlorhexidine Gluconate Cloth  6 each Topical Q0600   furosemide  40 mg Oral Daily   guaiFENesin  1,200 mg Oral BID   heparin  5,000 Units Subcutaneous Q8H   insulin aspart  0-20 Units Subcutaneous TID WC   insulin glargine-yfgn  25 Units Subcutaneous QHS   metroNIDAZOLE  500 mg Oral Q12H   pantoprazole  40 mg Oral Daily   senna-docusate  1 tablet Oral BID   sodium chloride flush  3 mL Intravenous Q12H   sucroferric oxyhydroxide  500 mg Oral TID WC   vancomycin variable dose per unstable renal function (pharmacist dosing)   Does not apply See admin instructions      Gean Quint, MD [place] 07/03/2022, 11:59 AM

## 2022-07-03 NOTE — Progress Notes (Signed)
PROGRESS NOTE  [name] HOWES Sr.  DOB: 14-Aug-1961  PCP: Kathyrn Lass YQM:[id number]  DOA: 07/01/2022  LOS: 2 days  Hospital Day: 3  Brief narrative: [name] BUCHAN Sr. is a 61 y.o. male with PMH significant for ESRD-HD-TTS, DM2, HTN, HLD, CAD/MI, recent right BKA Patient was brought to the ED on 8/15 from [facility] for evaluation of redness, drainage from his amputation site concerning for possible infection.   Patient recently underwent right BKA on 7/7 by Dr. Sharol Given for right foot osteomyelitis.  While at the rehab, he was noted to have progressive worsening of redness, drainage and pain at the site of amputation and hence sent to the ED.  In the ED, he had an initial temperature of 100.9, heart rate 102, blood pressure 146/81, 90% O2 sat on room air. Labs showed WBC of 13.3, hemoglobin 9.3, BNP more than 1600, lactic acid 1.3, glucose low at 56. CXR with cardiomegaly and pulmonary congestion.   In the ED, patient was started on vancomycin/Rocephin/ flagyl.   Admitted to hospitalist service.  Subjective: Patient was seen and examined this morning.  Middle-aged African-American male.  Lying on bed.  Not in distress Per RN, patient is having multiple episodes of loose stool  Assessment and plan: Amputation stump infection S/p right BKA 7/7, sent from SNF with erythema, drainage, fever c/w cellulitis.  Currently on IV Rocephin, IV vancomycin Consult request sent to Dr. Sharol Given.  Multiple loose stool Low-grade fever this morning. C. difficile negative GY panel pending  ESRD-HD-TTS Missed his dialysis yesterday while in the ED Nephrology on board.  Patient was transported to [facility] for hemodialysis last night.  He was brought back to [facility] because of inpatient bed scarcity at [name].  He will be transferred back to [facility] once bed is available  Uncontrolled type 2 diabetes mellitus Hypoglycemia A1c 8.2 on 05/24/2022.  In last 24 hours, blood sugar has been  running mostly low, as low as 51 last night. PTA on Lantus 30 units daily Currently on sliding scale insulin.  Diabetes care coordinator consult appreciated.  Will resume Lantus from tonight at 25 units. Recent Labs  Lab 07/02/22 0741 07/02/22 1203 07/02/22 1802 07/02/22 2148 07/03/22 0726  GLUCAP 177* 183* 183* 177* 110*   Chronic systolic CHF  Essential hypertension Echo 02/05/2022 with EF 45 to 50%, LVH, global hypokinesis  Blood pressure stable PTA on amlodipine 5 mg daily.  Continue the same  History of CAD/MI Continue aspirin  GERD  Continue PPI   Goals of care   Code Status: Full Code    Mobility: Needs PT eval postprocedure  Skin assessment:  Pressure Injury 07/02/22 Buttocks Left;Medial Stage 3 -  Full thickness tissue loss. Subcutaneous fat may be visible but bone, tendon or muscle are NOT exposed. (Active)  07/02/22 0058  Location: Buttocks  Location Orientation: Left;Medial  Staging: Stage 3 -  Full thickness tissue loss. Subcutaneous fat may be visible but bone, tendon or muscle are NOT exposed.  Wound Description (Comments):   Present on Admission: Yes    Nutritional status:  Body mass index is 30.22 kg/m.          Diet:  Diet Order             Diet heart healthy/carb modified Room service appropriate? Yes; Fluid consistency: Thin  Diet effective now                   DVT prophylaxis:  heparin injection 5,000 Units Start: 07/02/22 0600   Antimicrobials: IV Rocephin and vancomycin Fluid: None Consultants: Orthopedics consulted, renal Family Communication: None at bedside  Status is: Inpatient  Continue in-hospital care because: Needs IV antibiotics, may need surgical intervention Level of care: Telemetry Medical.  Cardiac monitoring ordered because of significant cardiac history.  Dispo: The patient is from: Rehab              Anticipated d/c is to: Back to rehab hopefully              Patient currently is not medically stable  to d/c.   Difficult to place patient No     Infusions:   sodium chloride     anticoagulant sodium citrate     cefTRIAXone (ROCEPHIN)  IV 2 g (07/02/22 1430)    Scheduled Meds:  amLODipine  5 mg Oral q AM   aspirin EC  81 mg Oral Daily   benzonatate  100 mg Oral TID   Chlorhexidine Gluconate Cloth  6 each Topical Q0600   furosemide  40 mg Oral Daily   guaiFENesin  1,200 mg Oral BID   heparin  5,000 Units Subcutaneous Q8H   insulin aspart  0-20 Units Subcutaneous TID WC   insulin glargine-yfgn  25 Units Subcutaneous QHS   metroNIDAZOLE  500 mg Oral Q12H   pantoprazole  40 mg Oral Daily   senna-docusate  1 tablet Oral BID   sodium chloride flush  3 mL Intravenous Q12H   sucroferric oxyhydroxide  500 mg Oral TID WC   vancomycin variable dose per unstable renal function (pharmacist dosing)   Does not apply See admin instructions    PRN meds: sodium chloride, acetaminophen **OR** acetaminophen, alteplase, anticoagulant sodium citrate, heparin, heparin, ipratropium-albuterol, lidocaine (PF), lidocaine-prilocaine, ondansetron, oxyCODONE-acetaminophen, pentafluoroprop-tetrafluoroeth, sodium chloride flush   Antimicrobials: Anti-infectives (From admission, onward)    Start     Dose/Rate Route Frequency Ordered Stop   07/01/22 2330  vancomycin (VANCOCIN) IVPB 1000 mg/200 mL premix        1,000 mg 200 mL/hr over 60 Minutes Intravenous  Once 07/01/22 2234 07/02/22 0236   07/01/22 2234  vancomycin variable dose per unstable renal function (pharmacist dosing)         Does not apply See admin instructions 07/01/22 2234     07/01/22 1045  vancomycin (VANCOREADY) IVPB 2000 mg/400 mL        2,000 mg 200 mL/hr over 120 Minutes Intravenous  Once 07/01/22 1032 07/01/22 1558   07/01/22 1030  cefTRIAXone (ROCEPHIN) 2 g in sodium chloride 0.9 % 100 mL IVPB        2 g 200 mL/hr over 30 Minutes Intravenous Every 24 hours 07/01/22 1023 07/08/22 1359   07/01/22 1030  metroNIDAZOLE (FLAGYL) tablet  500 mg        500 mg Oral Every 12 hours 07/01/22 1023 07/08/22 0959       Objective: Vitals:   07/02/22 2145 07/03/22 0556  BP: 112/64 118/70  Pulse: 81 81  Resp: 17 17  Temp: 97.7 F (36.5 C) 98.2 F (36.8 C)  SpO2: 92% 93%    Intake/Output Summary (Last 24 hours) at 07/03/2022 1054 Last data filed at 07/03/2022 0600 Gross per 24 hour  Intake 220 ml  Output --  Net 220 ml   Filed Weights   07/01/22 1746 07/02/22 0500 07/03/22 0500  Weight: 107.1 kg 103.1 kg 103.9 kg   Weight change: -3.2 kg Body mass index is 30.22 kg/m.  Physical Exam:  Awake Alert, Oriented X 3, No new F.N deficits, Normal affect Symmetrical Chest wall movement, Good air movement bilaterally, CTAB RRR,No Gallops,Rubs or new Murmurs, No Parasternal Heave +ve B.Sounds, Abd Soft, No tenderness, No rebound - guarding or rigidity. No Cyanosis, or edema in left lower extremity, right BKA, site bandaged   Data Review: I have personally reviewed the laboratory data and studies available.  F/u labs  Unresulted Labs (From admission, onward)     Start     Ordered   07/02/22 1247  Gastrointestinal Panel by PCR , Stool  (Gastrointestinal Panel by PCR, Stool                                                                                                                                                     **Does Not include CLOSTRIDIUM DIFFICILE testing. **If CDIFF testing is needed, place order from the "C Difficile Testing" order set.**)  Once,   R        07/02/22 1246   07/01/22 1512  Hepatitis B surface antibody,quantitative  (New Admission Hemo Labs (Hepatitis B))  Once,   URGENT       Question:  Specimen collection method  Answer:  IV Team=IV Team collect   07/01/22 1518   07/01/22 0931  Urinalysis, Routine w reflex microscopic Urine, Clean Catch  (Undifferentiated presentation (screening labs and basic nursing orders))  ONCE - URGENT,   URGENT        07/01/22 0930   07/01/22 0931  Urine Culture   (Undifferentiated presentation (screening labs and basic nursing orders))  ONCE - URGENT,   URGENT       Question:  Indication  Answer:  Sepsis   07/01/22 0930            Signed, Phillips Climes, MD [facility] 07/03/2022

## 2022-07-03 NOTE — Consult Note (Addendum)
[facility] Nurse Consult Note: Consult requested for right stump.  Pt recently had BKA surgery performed by Dr Sharol Given of the ortho service on 7/7.  Pt states the wound declined this week while at a rehab facility.  According to primary team progress notes, a consult by Dr Sharol Given is pending.  Requested to provide topical treatment orders until further recommendations are available from Dr Sharol Given. Right stump was reported to have a large amt bloody drainage earlier and bedside nurses changed it.  2 areas of full thickness wounds over previous surgical incision site; each is approx .5X.5cm and they tunnel 5 cm underneath the skin level and communicate when a swab was inserted.  Large amt thick tan pus draining from sites when patient moves his stump, strong foul odor, tender to the touch and fluctuant. Bone is palpable with swab. Pt's family member contacted me by [facility] and requested photos of the wound and the soiled dressings.  I sent them to her as requested; these were confidential, without any identifying facial features, no patient information, and I deleted them from the phone as soon as they were sent.  Dressing procedure/placement/frequency: Topical treatment orders provided for bedside nurses to perform as follows to absorb drainage until further orders are available from Dr Sharol Given: Cut narrow strip of Aquacel Kellie Simmering # [phone]) and tuck into 2 wounds to right stump Q day, using swab to fill, then cover with ABD pads, kerlex, and ace wrap. Please re-consult if further assistance is needed.  Thank-you,  Julien Girt MSN, [place], [place], [place], [place]

## 2022-07-03 NOTE — Plan of Care (Signed)
  Problem: Education: Goal: Knowledge of General Education information will improve Description: Including pain rating scale, medication(s)/side effects and non-pharmacologic comfort measures Outcome: Progressing   Problem: Clinical Measurements: Goal: Ability to maintain clinical measurements within normal limits will improve Outcome: Progressing   Problem: Pain Managment: Goal: General experience of comfort will improve Outcome: Progressing   Problem: Safety: Goal: Ability to remain free from injury will improve Outcome: Progressing   Problem: Skin Integrity: Goal: Risk for impaired skin integrity will decrease Outcome: Progressing   

## 2022-07-03 NOTE — H&P (View-Only) (Signed)
ORTHOPAEDIC CONSULTATION  REQUESTING PHYSICIAN: [name], [name], MD  Chief Complaint: Purulent abscess right BKA  HPI: [name] CUTSFORTH Sr. is a 61 y.o. male who presents with end-stage renal disease on dialysis with uncontrolled type 2 diabetes status post right transtibial amputation.  Patient has had progressive dehiscence with purulent drainage of the transtibial amputation.  Past Medical History:  Diagnosis Date   CHF (congestive heart failure) ([place])    ECHO 02/05/22  EF 45-50%, DD indeterminent   Diabetes mellitus without complication ([place])    type 2   ESRD on hemodialysis ([place])    mon wed fri   Hypertension    Kidney failure    Myocardial infarction ([place])    Nonischemic cardiomyopathy [facility])    Past Surgical History:  Procedure Laterality Date   A/V FISTULAGRAM N/A 08/15/2019   Procedure: A/V FISTULAGRAM;  Surgeon: [name], MD;  Location: [place] [facility];  Service: Cardiovascular;  Laterality: N/A;   AMPUTATION Right 02/02/2022   Procedure: AMPUTATION 5th RAY;  Surgeon: [name], MD;  Location: [place];  Service: Podiatry;  Laterality: Right;   AMPUTATION Right 05/23/2022   Procedure: RIGHT BELOW KNEE AMPUTATION;  Surgeon: [name], MD;  Location: [place];  Service: Orthopedics;  Laterality: Right;   APPLICATION OF WOUND VAC  05/23/2022   Procedure: APPLICATION OF WOUND VAC;  Surgeon: [name], MD;  Location: [place];  Service: Orthopedics;;   AV FISTULA PLACEMENT     CARDIAC CATHETERIZATION  02/17/2018   LHC 02/17/18 ([name]): 30% pLAD, 30% dRCA, LVEDP 25-30, LVEF 25%.     CATARACT EXTRACTION W/PHACO Right 03/29/2020   Procedure: CATARACT EXTRACTION PHACO AND INTRAOCULAR LENS PLACEMENT ([place])  RIGHT DIABETIC;  Surgeon: [name], MD;  Location: [facility];  Service: Ophthalmology;  Laterality: Right;  Lot # [id number] H [place]: 02:30.9 AP% 9.5% CDE: 14.38   EYE SURGERY Right    cataract removed   I & D EXTREMITY Right 02/02/2022    Procedure: IRRIGATION AND DEBRIDEMENT RIGHT FOOT;  Surgeon: [name], MD;  Location: [place];  Service: Podiatry;  Laterality: Right;   INCISION AND DRAINAGE OF WOUND Right 02/08/2022   Procedure: IRRIGATION AND DEBRIDEMENT WOUND;  Surgeon: [name], DPM;  Location: [place];  Service: Podiatry;  Laterality: Right;  with removal of infected bone    INSERTION OF DIALYSIS CATHETER  08/13/2019   Procedure: Insertion Of Dialysis Catheter;  Surgeon: [name], MD;  Location: [place];  Service: Vascular;;   INSERTION OF DIALYSIS CATHETER Left 07/19/2020   Procedure: INSERTION OF DIALYSIS CATHETER;  Surgeon: [name], MD;  Location: [place];  Service: Vascular;  Laterality: Left;   IRRIGATION AND DEBRIDEMENT ABSCESS     PERIPHERAL VASCULAR BALLOON ANGIOPLASTY  08/15/2019   Procedure: PERIPHERAL VASCULAR BALLOON ANGIOPLASTY;  Surgeon: [name], MD;  Location: [place] [facility];  Service: Cardiovascular;;   PERIPHERAL VASCULAR BALLOON ANGIOPLASTY Left 02/06/2022   Procedure: PERIPHERAL VASCULAR BALLOON ANGIOPLASTY;  Surgeon: [name], MD;  Location: [place] [facility];  Service: Cardiovascular;  Laterality: Left;   REVISON OF ARTERIOVENOUS FISTULA Left 07/19/2020   Procedure: REVISON/PLICATION OF ARTERIOVENOUS FISTULA LEFT;  Surgeon: [name], MD;  Location: [facility];  Service: Vascular;  Laterality: Left;   THROMBECTOMY AND REVISION OF ARTERIOVENTOUS (AV) GORETEX  GRAFT Left 08/13/2019   Procedure: THROMBECTOMY OF LEFT ARM ARTERIOVENTOUS (AV) FISTULA;  Surgeon: [name], MD;  Location: [place];  Service: Vascular;  Laterality: Left;   TOE AMPUTATION     Social History   Socioeconomic History   Marital status: Divorced    Spouse name: Not on file   Number of children: Not on file   Years of education: Not on file   Highest education level: Not on file  Occupational History   Not on file  Tobacco Use   Smoking status: Never    Smokeless tobacco: Never  Vaping Use   Vaping Use: Never used  Substance and Sexual Activity   Alcohol use: Not Currently   Drug use: Never   Sexual activity: Not on file  Other Topics Concern   Not on file  Social History Narrative   Not on file   Social Determinants of Health   Financial Resource Strain: Not on file  Food Insecurity: Not on file  Transportation Needs: Not on file  Physical Activity: Not on file  Stress: Not on file  Social Connections: Not on file   Family History  Problem Relation Age of Onset   Hypertension Mother    - negative except otherwise stated in the family history section Allergies  Allergen Reactions   Sulfa Antibiotics Nausea And Vomiting and Other (See Comments)    "Allergic," per [facility]   Prior to Admission medications   Medication Sig Start Date End Date Taking? Authorizing Provider  Amino Acids-Protein Hydrolys (FEEDING SUPPLEMENT, PRO-STAT SUGAR FREE 64,) LIQD Take 30 mLs by mouth 2 (two) times daily.   Yes [provider]  Amino Acids-Protein Hydrolys (PRO-STAT AWC) LIQD Take 30 mLs by mouth 2 (two) times daily.   Yes [provider]  amLODipine (NORVASC) 5 MG tablet Take 5 mg by mouth in the morning.   Yes [provider]  aspirin EC 81 MG EC tablet Take 1 tablet (81 mg total) by mouth daily. Swallow whole. 02/21/22  Yes Sheikh, Omair Latif, DO  benzonatate (TESSALON) 200 MG capsule Take 200 mg by mouth See admin instructions. Take 200 mg by mouth at 8 AM, 2 PM, and 8 PM on Sun/Mon/Wed/Fri and 200 mg at 8 AM and 5 PM every Tues/Thurs/Sat   Yes [provider]  ethyl chloride spray Apply 1 Application topically Every Tuesday,Thursday,and Saturday with dialysis.   Yes [provider]  famotidine (PEPCID) 20 MG tablet Take 20 mg by mouth at bedtime.   Yes [provider]  guaiFENesin (ROBITUSSIN) 100 MG/5ML liquid Take 10 mLs by mouth every 4 (four) hours as needed for cough.   Yes [provider]  insulin glargine (LANTUS) 100 UNIT/ML injection Inject 30 Units into the skin at bedtime.   Yes [provider]  ipratropium-albuterol (DUONEB) 0.5-2.5 (3) MG/3ML SOLN Take 3 mLs by nebulization every 4 (four) hours as needed (for wheezing or shortness of breath).   Yes [provider]  Nutritional Supplements (NEPRO) LIQD Take by mouth 2 (two) times daily.   Yes [provider]  ondansetron (ZOFRAN) 4 MG tablet Take 4 mg by mouth every 6 (six) hours as needed for nausea or vomiting. 06/30/22  Yes [provider]  oxyCODONE-acetaminophen (PERCOCET) 5-325 MG tablet Take 1 tablet by mouth every 4 (four) hours as needed for severe pain. 05/26/22 05/26/23 Yes Suzan Slick, NP  pantoprazole (PROTONIX) 40 MG tablet Take 1 tablet (40 mg total) by mouth daily at 12 noon. Patient taking differently: Take 40 mg by mouth daily before breakfast. 02/21/22  Yes Sheikh, Omair Latif, DO  senna-docusate (SENOKOT-S) 8.6-50 MG  tablet Take 1 tablet by mouth 2 (two) times daily. 02/20/22  Yes Sheikh, Omair Latif, DO  sucroferric oxyhydroxide (VELPHORO) 500 MG chewable tablet Chew 1,000 mg by mouth See admin instructions. Chew 1,000 mg by mouth at 9 AM and 5 PM on daily and an additional 1,000 mg at noon only on Mon/Wed/Fri   Yes [provider]  sucroferric oxyhydroxide (VELPHORO) 500 MG chewable tablet Chew 1,000 mg by mouth See admin instructions. Chew 1,000 mg by mouth one time a day every Mon, Wed, Fri, Sun with lunch   Yes [provider]  guaiFENesin (MUCINEX) 600 MG 12 hr tablet Take 1,200 mg by mouth 2 (two) times daily. Give 2 tablet by mouth every 12 hours for congestion for 30 days Patient not taking: Reported on 07/01/2022    [provider]  multivitamin (RENA-VIT) TABS tablet Take 1 tablet by mouth at bedtime. Patient not taking: Reported on 05/22/2022 02/20/22   Raiford Noble Latif, DO  Nutritional Supplements (,FEEDING SUPPLEMENT,  PROSOURCE PLUS) liquid Take 30 mLs by mouth 2 (two) times daily between meals. Patient not taking: Reported on 05/22/2022 02/21/22   Raiford Noble Latif, DO  Nutritional Supplements (FEEDING SUPPLEMENT, NEPRO CARB STEADY,) LIQD Take 237 mLs by mouth 2 (two) times daily between meals. Patient not taking: Reported on 05/22/2022 02/21/22   Raiford Noble Latif, DO   No results found. - pertinent xrays, CT, MRI studies were reviewed and independently interpreted  Positive ROS: All other systems have been reviewed and were otherwise negative with the exception of those mentioned in the HPI and as above.  Physical Exam: General: Alert, no acute distress Psychiatric: Patient is competent for consent with normal mood and affect Lymphatic: No axillary or cervical lymphadenopathy Cardiovascular: No pedal edema Respiratory: No cyanosis, no use of accessory musculature GI: No organomegaly, abdomen is soft and non-tender    Images:  @ENCIMAGES @  Labs:  Lab Results  Component Value Date   HGBA1C 8.2 (H) 05/24/2022   HGBA1C 12.2 (H) 02/02/2022   HGBA1C 11.9 (H) 02/02/2022   ESRSEDRATE 85 (H) 02/02/2022   CRP 33.6 (H) 02/02/2022   CRP 29.8 (H) 02/02/2022   REPTSTATUS PENDING 07/02/2022   GRAMSTAIN  02/02/2022    RARE WBC PRESENT,BOTH PMN AND MONONUCLEAR ABUNDANT GRAM VARIABLE ROD ABUNDANT GRAM POSITIVE COCCI    CULT  07/02/2022    NO GROWTH 1 DAY Performed at [facility], [place] [address] [place], [place] [address]    LABORGA [facility] MORGANII 02/02/2022   LABORGA ENTEROCOCCUS FAECALIS 02/02/2022    Lab Results  Component Value Date   ALBUMIN 3.1 (L) 07/02/2022   ALBUMIN 2.6 (L) 07/01/2022   ALBUMIN 2.2 (L) 05/24/2022   PREALBUMIN 9.5 (L) 02/02/2022   PREALBUMIN 10.9 (L) 02/02/2022        Latest Ref Rng & Units 07/03/2022    3:22 AM 07/02/2022    1:00 AM 07/01/2022    1:44 PM  CBC EXTENDED  WBC 4.0 - 10.5 K/uL 10.0  14.0  13.3   RBC 4.22 - 5.81 MIL/uL 3.92  4.61  3.90    Hemoglobin 13.0 - 17.0 g/dL 9.3  11.1  9.3   HCT 39.0 - 52.0 % 31.8  37.1  30.8   Platelets 150 - 400 K/uL 291  342  304   NEUT# 1.7 - 7.7 K/uL 6.3   9.6   Lymph# 0.7 - 4.0 K/uL 1.1   0.9     Neurologic: Patient does not have protective  sensation bilateral lower extremities.   MUSCULOSKELETAL:   Skin: Examination the left foot has a good dorsalis pedis pulse there are no ulcers on the left foot or left leg.  Examination of the right transtibial amputation there is purulent drainage that extends down to bone.  Discussed patient's white blood cell count is improved from 14-10.  Hemoglobin has dropped from 11-9.3.  Albumin consistent with protein caloric malnutrition.  No ascending cellulitis.  Assessment:  Uncontrolled type 2 diabetes end-stage renal disease on dialysis with a purulent abscess right transtibial amputation.  Plan: Plan: Patient states he would not consider an amputation above the knee.  We will plan for revision of the right transtibial amputation.  We will plan for surgery tomorrow.  Patient is completing dialysis at this time.  Most likely will contact [facility] to bring him over for surgery.  I will type and cross for blood.  Thank you for the consult and the opportunity to see [name] [name], [place] [phone] 5:47 PM

## 2022-07-03 NOTE — TOC Initial Note (Signed)
Transition of Care (TOC) - Initial/Assessment Note    Patient Details  Name: [name] MCMATH Sr. MRN: [id number] Date of Birth: 06-Nov-1961  Transition of Care [facility]) CM/SW Contact:    Lennart Pall, LCSW Phone Number: 07/03/2022, 1:24 PM  Clinical Narrative:                 Met with pt to introduce TOC/CSW role and review dc plans.  Pt confirms he was admitted from [facility] where he is a LTC resident.  (Also, confirmed this with facility).  His primary support is daughter, Valinda Party.  Aware that plan is to attempt to get a bed at [facility] to avoid needing to move back and forth for HD treatments.  He confirms that his dc plan will be to return to [facility] when medically cleared.  TOC will continue to follow and will assist with this dc.  Expected Discharge Plan: Skilled Nursing Facility Barriers to Discharge: Continued Medical Work up   Patient Goals and CMS Choice        Expected Discharge Plan and Services Expected Discharge Plan: [place] In-house Referral: Clinical Social Work     Living arrangements for the past 2 months: [place]                 DME Arranged: N/A DME Agency: NA                  Prior Living Arrangements/Services Living arrangements for the past 2 months: [place] Lives with:: Facility Resident Patient language and need for interpreter reviewed:: Yes Do you feel safe going back to the place where you live?: Yes      Need for Family Participation in Patient Care: No (Comment) Care giver support system in place?: Yes (comment)   Criminal Activity/Legal Involvement Pertinent to Current Situation/Hospitalization: No - Comment as needed  Activities of Daily Living Home Assistive Devices/Equipment: Wheelchair ADL Screening (condition at time of admission) Patient's cognitive ability adequate to safely complete daily activities?: No Is the patient deaf or have difficulty hearing?: No Does the  patient have difficulty seeing, even when wearing glasses/contacts?: No Does the patient have difficulty concentrating, remembering, or making decisions?: Yes Patient able to express need for assistance with ADLs?: Yes Does the patient have difficulty dressing or bathing?: No Independently performs ADLs?: No Communication: Independent Dressing (OT): Independent Grooming: Independent Feeding: Independent Bathing: Independent Toileting: Needs assistance Is this a change from baseline?: Pre-admission baseline In/Out Bed: Needs assistance Is this a change from baseline?: Pre-admission baseline Walks in Home: Dependent Is this a change from baseline?: Pre-admission baseline Does the patient have difficulty walking or climbing stairs?: Yes Weakness of Legs: Right (recent right below knee amputation) Weakness of Arms/Hands: None  Permission Sought/Granted Permission sought to share information with : Facility Sport and exercise psychologist, Family Supports Permission granted to share information with : Yes, Verbal Permission Granted  Share Information with NAME: Patsey Berthold, daughter @ [phone]  Permission granted to share info w AGENCY: SNF        Emotional Assessment Appearance:: Appears older than stated age Attitude/Demeanor/Rapport: Gracious Affect (typically observed): Accepting Orientation: : Oriented to Self, Oriented to Place, Oriented to  Time, Oriented to Situation Alcohol / Substance Use: Not Applicable Psych Involvement: No (comment)  Admission diagnosis:  Wound infection [T14.8XXA, L08.9] Cellulitis of right leg [L03.115] Cellulitis of right lower extremity [L03.115] Chronic renal failure, stage 5 (HCC) [N18.5] Patient Active Problem List   Diagnosis Date Noted  Pressure injury of skin 07/02/2022   Cellulitis of right leg 07/01/2022   CAD (coronary artery disease) 05/24/2022   Leukocytosis 05/24/2022   Below-knee amputation of right lower extremity ([place]) 05/23/2022    Obesity (BMI 30-39.9) 02/19/2022   History of CVA  02/05/2022   Carotid stenosis 02/05/2022   acute on chronic Anemia of chronic renal failure  02/03/2022   Subacute osteomyelitis, right ankle and foot ([place]) 96/75/9163   Acute metabolic encephalopathy 84/66/5993   Protein-calorie malnutrition, mild ([place]) 02/02/2022   GERD (gastroesophageal reflux disease) 57/11/7791   Chronic systolic CHF (congestive heart failure) ([place]) 08/14/2019   LBBB (left bundle branch block) 08/13/2019   Problem with vascular access 08/13/2019   Dialysis AV fistula malfunction ([place]) 08/12/2019   ESRD (end stage renal disease) ([place]) 09/20/2018   DM2 (diabetes mellitus, type 2) ([place]) 09/20/2018   HTN (hypertension) 09/20/2018   PCP:  Merryl Hacker, No Pharmacy:   [facility] [place], [place] [address] [place] [address] Phone: [phone] Fax: [phone]  [facility] [place], [place] - [place] [address] [place] [address] Phone: [phone] Fax: [phone]     Social Determinants of Health (SDOH) Interventions    Readmission Risk Interventions     No data to display

## 2022-07-03 NOTE — Progress Notes (Signed)
Initial Nutrition Assessment  DOCUMENTATION CODES:   Not applicable  INTERVENTION:  - will order Nepro Shake BID, each supplement provides 425 kcal and 19 grams protein.  - will 1 packet Juven BID, each packet provides 95 calories, 2.5 grams of protein (collagen), and 9.8 grams of carbohydrate (3 grams sugar); also contains 7 grams of L-arginine and L-glutamine, 300 mg vitamin C, 15 mg vitamin E, 1.2 mcg vitamin B-12, 9.5 mg zinc, 200 mg calcium, and 1.5 g  Calcium Beta-hydroxy-Beta-methylbutyrate to support wound healing.  - will order 30 ml Prosource Plus BID, each supplement provides 100 kcal and 15 grams protein.   - will order 1 tablet rena-vit/day.  - complete NFPE when feasible.  - will add Food Pyramid for Healthy Eating with Kidney Disease.    NUTRITION DIAGNOSIS:   Increased nutrient needs related to acute illness, chronic illness, wound healing as evidenced by estimated needs.  GOAL:   Patient will meet greater than or equal to 90% of their needs  MONITOR:   PO intake, Supplement acceptance, Labs, Weight trends  REASON FOR ASSESSMENT:   Malnutrition Screening Tool  ASSESSMENT:   61 y.o. male with medical history of ESRD on HD (TTS), type 2 DM, HTN, HLD, CAD/MI, and R BKA on 05/23/22 d/t osteomyelitis. He presented to the ED on 8/15 from [facility] due to redness, drainage, and pain from amputation site. In the ED, CXR showed cardiomegaly and pulmonary congestion. He was admitted due to RLE cellulitis.  Patient is currently out of the room to [facility] for iHD. His last session was on 8/15.   Diet advanced from NPO to Heart Healthy/Carb Modified on 8/15 at 1520 and no meal intakes documented in the flow sheet.   He was last seen by a [place] RD on 02/13/22.  Weight today is documented as both 217 lb and 229 lb. Weight on 7/8 was 249 lb and weight on 6/30 was 230 lb. Of note, patient's amputation was on 7/7. Mild pitting edema to RLE was documented  in the edema section of flow sheet this AM.    Labs reviewed; CBGs: 110 and 144 mg/dl, K: 3.2 mmol/l, BUN: 50 mg/dl, creatinine: 8.54 mg/dl, Ca: 8.6 mg/dl, GFR: 7 ml/min.  Medications reviewed; 40 mg oral lasix/day, sliding scale novolog, 25 units semglee/day, 40 mg oral protonix/day, 1 tablet senokot BID.    NUTRITION - FOCUSED PHYSICAL EXAM:  Patient at [facility] for iHD.  Diet Order:   Diet Order             Diet heart healthy/carb modified Room service appropriate? Yes; Fluid consistency: Thin  Diet effective now                   EDUCATION NEEDS:   Not appropriate for education at this time  Skin:  Skin Assessment: Skin Integrity Issues: Skin Integrity Issues:: Stage III, Other (Comment) Stage III: R buttocks Other: non-pressure injury to R knee/amputation site  Last BM:  8/16 (type 7 x2, both large amounts; type 5 x1, medium amount)  Height:   Ht Readings from Last 1 Encounters:  05/23/22 6\' 1"  (1.854 m)    Weight:   Wt Readings from Last 1 Encounters:  07/03/22 98.6 kg     BMI:  Body mass index is 28.68 kg/m.  Estimated Nutritional Needs:  Kcal:  2500-2700 kcal Protein:  125-135 grams Fluid:  >/= 1.5 L/day      Jarome Matin, MS, RD, LDN, CNSC Registered  Dietitian II Inpatient Clinical Nutrition RD pager # and on-call/weekend pager # available in [place]

## 2022-07-04 ENCOUNTER — Ambulatory Visit: Payer: Medicare Other | Admitting: Family

## 2022-07-04 ENCOUNTER — Other Ambulatory Visit: Payer: Self-pay

## 2022-07-04 ENCOUNTER — Inpatient Hospital Stay (HOSPITAL_COMMUNITY): Payer: Medicare Other | Admitting: Anesthesiology

## 2022-07-04 ENCOUNTER — Encounter (HOSPITAL_COMMUNITY)
Admission: EM | Disposition: A | Discharge status: Skilled Nursing Facility | Payer: Self-pay | Source: Skilled Nursing Facility | Attending: Internal Medicine

## 2022-07-04 ENCOUNTER — Encounter (HOSPITAL_COMMUNITY): Payer: Self-pay | Admitting: Internal Medicine

## 2022-07-04 DIAGNOSIS — L02415 Cutaneous abscess of right lower limb: Secondary | ICD-10-CM

## 2022-07-04 DIAGNOSIS — E1122 Type 2 diabetes mellitus with diabetic chronic kidney disease: Secondary | ICD-10-CM

## 2022-07-04 DIAGNOSIS — T8781 Dehiscence of amputation stump: Secondary | ICD-10-CM

## 2022-07-04 DIAGNOSIS — K2100 Gastro-esophageal reflux disease with esophagitis, without bleeding: Secondary | ICD-10-CM

## 2022-07-04 DIAGNOSIS — I132 Hypertensive heart and chronic kidney disease with heart failure and with stage 5 chronic kidney disease, or end stage renal disease: Secondary | ICD-10-CM | POA: Diagnosis not present

## 2022-07-04 DIAGNOSIS — I5022 Chronic systolic (congestive) heart failure: Secondary | ICD-10-CM

## 2022-07-04 DIAGNOSIS — Z992 Dependence on renal dialysis: Secondary | ICD-10-CM

## 2022-07-04 DIAGNOSIS — N186 End stage renal disease: Secondary | ICD-10-CM

## 2022-07-04 DIAGNOSIS — D631 Anemia in chronic kidney disease: Secondary | ICD-10-CM

## 2022-07-04 DIAGNOSIS — I2510 Atherosclerotic heart disease of native coronary artery without angina pectoris: Secondary | ICD-10-CM | POA: Diagnosis not present

## 2022-07-04 DIAGNOSIS — R197 Diarrhea, unspecified: Secondary | ICD-10-CM

## 2022-07-04 DIAGNOSIS — T8789 Other complications of amputation stump: Secondary | ICD-10-CM

## 2022-07-04 DIAGNOSIS — I509 Heart failure, unspecified: Secondary | ICD-10-CM | POA: Diagnosis not present

## 2022-07-04 DIAGNOSIS — Z794 Long term (current) use of insulin: Secondary | ICD-10-CM

## 2022-07-04 DIAGNOSIS — L03115 Cellulitis of right lower limb: Secondary | ICD-10-CM | POA: Diagnosis not present

## 2022-07-04 DIAGNOSIS — I10 Essential (primary) hypertension: Secondary | ICD-10-CM

## 2022-07-04 HISTORY — PX: AMPUTATION: SHX166

## 2022-07-04 LAB — CBC WITH DIFFERENTIAL/PLATELET
Abs Immature Granulocytes: 0.08 10*3/uL — ABNORMAL HIGH (ref 0.00–0.07)
Basophils Absolute: 0.1 10*3/uL (ref 0.0–0.1)
Basophils Relative: 1 %
Eosinophils Absolute: 1 10*3/uL — ABNORMAL HIGH (ref 0.0–0.5)
Eosinophils Relative: 11 %
HCT: 33.9 % — ABNORMAL LOW (ref 39.0–52.0)
Hemoglobin: 10.2 g/dL — ABNORMAL LOW (ref 13.0–17.0)
Immature Granulocytes: 1 %
Lymphocytes Relative: 12 %
Lymphs Abs: 1.1 10*3/uL (ref 0.7–4.0)
MCH: 23.8 pg — ABNORMAL LOW (ref 26.0–34.0)
MCHC: 30.1 g/dL (ref 30.0–36.0)
MCV: 79 fL — ABNORMAL LOW (ref 80.0–100.0)
Monocytes Absolute: 1.8 10*3/uL — ABNORMAL HIGH (ref 0.1–1.0)
Monocytes Relative: 19 %
Neutro Abs: 5.3 10*3/uL (ref 1.7–7.7)
Neutrophils Relative %: 56 %
Platelets: 380 10*3/uL (ref 150–400)
RBC: 4.29 MIL/uL (ref 4.22–5.81)
RDW: 21.8 % — ABNORMAL HIGH (ref 11.5–15.5)
WBC: 9.3 10*3/uL (ref 4.0–10.5)
nRBC: 2 % — ABNORMAL HIGH (ref 0.0–0.2)

## 2022-07-04 LAB — BASIC METABOLIC PANEL
Anion gap: 13 (ref 5–15)
BUN: 28 mg/dL — ABNORMAL HIGH (ref 6–20)
CO2: 27 mmol/L (ref 22–32)
Calcium: 9.1 mg/dL (ref 8.9–10.3)
Chloride: 92 mmol/L — ABNORMAL LOW (ref 98–111)
Creatinine, Ser: 6.46 mg/dL — ABNORMAL HIGH (ref 0.61–1.24)
GFR, Estimated: 9 mL/min — ABNORMAL LOW (ref >60)
Glucose, Bld: 227 mg/dL — ABNORMAL HIGH (ref 70–99)
Potassium: 3.5 mmol/L (ref 3.5–5.1)
Sodium: 132 mmol/L — ABNORMAL LOW (ref 135–145)

## 2022-07-04 LAB — HEMOGLOBIN AND HEMATOCRIT, BLOOD
HCT: 35.5 % — ABNORMAL LOW (ref 39.0–52.0)
Hemoglobin: 11.1 g/dL — ABNORMAL LOW (ref 13.0–17.0)

## 2022-07-04 LAB — MAGNESIUM: Magnesium: 1.6 mg/dL — ABNORMAL LOW (ref 1.7–2.4)

## 2022-07-04 LAB — URINE CULTURE: Culture: NO GROWTH

## 2022-07-04 LAB — GLUCOSE, CAPILLARY
Glucose-Capillary: 224 mg/dL — ABNORMAL HIGH (ref 70–99)
Glucose-Capillary: 145 mg/dL — ABNORMAL HIGH (ref 70–99)
Glucose-Capillary: 163 mg/dL — ABNORMAL HIGH (ref 70–99)
Glucose-Capillary: 167 mg/dL — ABNORMAL HIGH (ref 70–99)
Glucose-Capillary: 178 mg/dL — ABNORMAL HIGH (ref 70–99)
Glucose-Capillary: 312 mg/dL — ABNORMAL HIGH (ref 70–99)

## 2022-07-04 LAB — PREPARE RBC (CROSSMATCH)

## 2022-07-04 SURGERY — AMPUTATION BELOW KNEE
Anesthesia: Regional | Site: Knee | Laterality: Right

## 2022-07-04 MED ORDER — ALUM & MAG HYDROXIDE-SIMETH 200-200-20 MG/5ML PO SUSP: 15.0000 mL | ORAL | Status: DC | PRN

## 2022-07-04 MED ORDER — FENTANYL CITRATE (PF) 100 MCG/2ML IJ SOLN: 25.0000 ug | INTRAMUSCULAR | Status: DC | PRN

## 2022-07-04 MED ORDER — MIDAZOLAM HCL 2 MG/2ML IJ SOLN
INTRAMUSCULAR | Status: AC
  Filled 2022-07-04: qty 2

## 2022-07-04 MED ORDER — LABETALOL HCL 5 MG/ML IV SOLN: 10.0000 mg | INTRAVENOUS | Status: DC | PRN

## 2022-07-04 MED ORDER — POLYETHYLENE GLYCOL 3350 17 G PO PACK: 17.0000 g | PACK | Freq: Every day | ORAL | Status: DC | PRN

## 2022-07-04 MED ORDER — PHENYLEPHRINE HCL-NACL 20-0.9 MG/250ML-% IV SOLN
INTRAVENOUS | Status: DC | PRN
  Administered 2022-07-04: 75 ug/min via INTRAVENOUS

## 2022-07-04 MED ORDER — PANTOPRAZOLE SODIUM 40 MG PO TBEC: 40.0000 mg | DELAYED_RELEASE_TABLET | Freq: Every day | ORAL | Status: DC

## 2022-07-04 MED ORDER — FUROSEMIDE 40 MG PO TABS
40.0000 mg | ORAL_TABLET | Freq: Every day | ORAL | Status: DC
  Administered 2022-07-05 – 2022-07-08 (×4): 40 mg via ORAL
  Filled 2022-07-04 (×4): qty 1

## 2022-07-04 MED ORDER — GLYCOPYRROLATE PF 0.2 MG/ML IJ SOSY
PREFILLED_SYRINGE | INTRAMUSCULAR | Status: AC
  Filled 2022-07-04: qty 1

## 2022-07-04 MED ORDER — LOPERAMIDE HCL 2 MG PO CAPS
2.0000 mg | ORAL_CAPSULE | ORAL | Status: DC | PRN
  Administered 2022-07-04 – 2022-07-07 (×2): 2 mg via ORAL
  Filled 2022-07-04 (×2): qty 1

## 2022-07-04 MED ORDER — METOPROLOL TARTRATE 5 MG/5ML IV SOLN: 2.0000 mg | INTRAVENOUS | Status: DC | PRN

## 2022-07-04 MED ORDER — CEFAZOLIN SODIUM-DEXTROSE 2-4 GM/100ML-% IV SOLN
2.0000 g | Freq: Three times a day (TID) | INTRAVENOUS | Status: AC
  Administered 2022-07-04 – 2022-07-05 (×2): 2 g via INTRAVENOUS
  Filled 2022-07-04 (×2): qty 100

## 2022-07-04 MED ORDER — PHENOL 1.4 % MT LIQD: 1.0000 | OROMUCOSAL | Status: DC | PRN

## 2022-07-04 MED ORDER — FENTANYL CITRATE (PF) 250 MCG/5ML IJ SOLN
INTRAMUSCULAR | Status: AC
  Filled 2022-07-04: qty 5

## 2022-07-04 MED ORDER — CHLORHEXIDINE GLUCONATE 0.12 % MT SOLN: 15.0000 mL | Freq: Once | OROMUCOSAL | Status: AC

## 2022-07-04 MED ORDER — ROPIVACAINE HCL 5 MG/ML IJ SOLN
INTRAMUSCULAR | Status: DC | PRN
  Administered 2022-07-04: 40 mL via PERINEURAL

## 2022-07-04 MED ORDER — MAGNESIUM SULFATE 2 GM/50ML IV SOLN: 2.0000 g | Freq: Every day | INTRAVENOUS | Status: DC | PRN

## 2022-07-04 MED ORDER — PROPOFOL 10 MG/ML IV BOLUS
INTRAVENOUS | Status: DC | PRN
  Administered 2022-07-04: 120 mg via INTRAVENOUS

## 2022-07-04 MED ORDER — MAGNESIUM CITRATE PO SOLN: 1.0000 | Freq: Once | ORAL | Status: DC | PRN

## 2022-07-04 MED ORDER — OXYCODONE HCL 5 MG PO TABS
5.0000 mg | ORAL_TABLET | ORAL | Status: DC | PRN
  Administered 2022-07-06: 10 mg via ORAL
  Filled 2022-07-04 (×3): qty 2

## 2022-07-04 MED ORDER — ONDANSETRON HCL 4 MG/2ML IJ SOLN
INTRAMUSCULAR | Status: AC
  Filled 2022-07-04: qty 2

## 2022-07-04 MED ORDER — MAGNESIUM SULFATE 4 GM/100ML IV SOLN
4.0000 g | Freq: Once | INTRAVENOUS | Status: AC
  Administered 2022-07-04: 4 g via INTRAVENOUS
  Filled 2022-07-04: qty 100

## 2022-07-04 MED ORDER — ACETAMINOPHEN 325 MG PO TABS
325.0000 mg | ORAL_TABLET | Freq: Four times a day (QID) | ORAL | Status: DC | PRN
  Filled 2022-07-04 (×3): qty 2

## 2022-07-04 MED ORDER — GUAIFENESIN-DM 100-10 MG/5ML PO SYRP: 15.0000 mL | ORAL_SOLUTION | ORAL | Status: DC | PRN

## 2022-07-04 MED ORDER — OXYCODONE HCL 5 MG PO TABS
10.0000 mg | ORAL_TABLET | ORAL | Status: DC | PRN
  Administered 2022-07-05 – 2022-07-06 (×2): 10 mg via ORAL

## 2022-07-04 MED ORDER — SODIUM CHLORIDE 0.9 % IV SOLN: INTRAVENOUS | Status: DC

## 2022-07-04 MED ORDER — HYDROMORPHONE HCL 1 MG/ML IJ SOLN
0.5000 mg | INTRAMUSCULAR | Status: DC | PRN
  Administered 2022-07-05 – 2022-07-08 (×3): 1 mg via INTRAVENOUS
  Filled 2022-07-04 (×3): qty 1

## 2022-07-04 MED ORDER — ONDANSETRON HCL 4 MG/2ML IJ SOLN
4.0000 mg | Freq: Four times a day (QID) | INTRAMUSCULAR | Status: DC | PRN
  Administered 2022-07-07: 4 mg via INTRAVENOUS
  Filled 2022-07-04: qty 2

## 2022-07-04 MED ORDER — POTASSIUM CHLORIDE CRYS ER 20 MEQ PO TBCR: 20.0000 meq | EXTENDED_RELEASE_TABLET | Freq: Every day | ORAL | Status: DC | PRN

## 2022-07-04 MED ORDER — ACETAMINOPHEN 10 MG/ML IV SOLN
1000.0000 mg | Freq: Once | INTRAVENOUS | Status: DC | PRN
Start: 2022-07-04 — End: 2022-07-04

## 2022-07-04 MED ORDER — FENTANYL CITRATE (PF) 100 MCG/2ML IJ SOLN
INTRAMUSCULAR | Status: AC
  Administered 2022-07-04: 50 ug via INTRAVENOUS
  Filled 2022-07-04: qty 2

## 2022-07-04 MED ORDER — ASCORBIC ACID 500 MG PO TABS
1000.0000 mg | ORAL_TABLET | Freq: Every day | ORAL | Status: DC
  Administered 2022-07-05 – 2022-07-08 (×4): 1000 mg via ORAL
  Filled 2022-07-04 (×4): qty 2

## 2022-07-04 MED ORDER — LIDOCAINE 2% (20 MG/ML) 5 ML SYRINGE
INTRAMUSCULAR | Status: DC | PRN
  Administered 2022-07-04: 100 mg via INTRAVENOUS

## 2022-07-04 MED ORDER — BISACODYL 5 MG PO TBEC: 5.0000 mg | DELAYED_RELEASE_TABLET | Freq: Every day | ORAL | Status: DC | PRN

## 2022-07-04 MED ORDER — HYDRALAZINE HCL 20 MG/ML IJ SOLN: 5.0000 mg | INTRAMUSCULAR | Status: DC | PRN

## 2022-07-04 MED ORDER — FENTANYL CITRATE (PF) 100 MCG/2ML IJ SOLN: 50.0000 ug | Freq: Once | INTRAMUSCULAR | Status: AC

## 2022-07-04 MED ORDER — 0.9 % SODIUM CHLORIDE (POUR BTL) OPTIME
TOPICAL | Status: DC | PRN
  Administered 2022-07-04: 1000 mL

## 2022-07-04 MED ORDER — CHLORHEXIDINE GLUCONATE 0.12 % MT SOLN
OROMUCOSAL | Status: AC
  Administered 2022-07-04: 15 mL via OROMUCOSAL
  Filled 2022-07-04: qty 15

## 2022-07-04 MED ORDER — ZINC SULFATE 220 (50 ZN) MG PO CAPS
220.0000 mg | ORAL_CAPSULE | Freq: Every day | ORAL | Status: DC
  Administered 2022-07-05 – 2022-07-08 (×4): 220 mg via ORAL
  Filled 2022-07-04 (×4): qty 1

## 2022-07-04 MED ORDER — DOCUSATE SODIUM 100 MG PO CAPS
100.0000 mg | ORAL_CAPSULE | Freq: Every day | ORAL | Status: DC
  Filled 2022-07-04: qty 1

## 2022-07-04 MED ORDER — ORAL CARE MOUTH RINSE: 15.0000 mL | Freq: Once | OROMUCOSAL | Status: AC

## 2022-07-04 MED ORDER — LIDOCAINE 2% (20 MG/ML) 5 ML SYRINGE
INTRAMUSCULAR | Status: AC
  Filled 2022-07-04: qty 10

## 2022-07-04 MED ORDER — JUVEN PO PACK
1.0000 | PACK | Freq: Two times a day (BID) | ORAL | Status: DC
  Administered 2022-07-04 – 2022-07-08 (×8): 1 via ORAL
  Filled 2022-07-04 (×7): qty 1

## 2022-07-04 MED ORDER — DEXAMETHASONE SODIUM PHOSPHATE 10 MG/ML IJ SOLN
INTRAMUSCULAR | Status: DC | PRN
  Administered 2022-07-04: 10 mg

## 2022-07-04 SURGICAL SUPPLY — 38 items
BAG COUNTER SPONGE SURGICOUNT (BAG)
BLADE SAW RECIP 87.9 MT (BLADE) ×1
BLADE SURG 21 STRL SS (BLADE) ×1
BNDG COHESIVE 6X5 TAN NS LF (GAUZE/BANDAGES/DRESSINGS) ×1
BNDG COHESIVE 6X5 TAN STRL LF (GAUZE/BANDAGES/DRESSINGS)
CANISTER WOUND CARE 500ML ATS (WOUND CARE) ×1
CANISTER WOUNDNEG PRESSURE 500 (CANNISTER) ×1
COVER SURGICAL LIGHT HANDLE (MISCELLANEOUS) ×1
CUFF TOURN SGL QUICK 34 (TOURNIQUET CUFF)
CUFF TRNQT CYL 34X4.125X (TOURNIQUET CUFF)
DRAPE INCISE IOBAN 66X45 STRL (DRAPES) ×1
DRAPE U-SHAPE 47X51 STRL (DRAPES) ×1
DRSG PREVENA PLUS CUSTOM (GAUZE/BANDAGES/DRESSINGS) ×1
DURAPREP 26ML APPLICATOR (WOUND CARE) ×1
ELECT REM PT RETURN 9FT ADLT (ELECTROSURGICAL) ×1
GLOVE BIOGEL PI IND STRL 9 (GLOVE) ×1
GLOVE BIOGEL PI INDICATOR 9 (GLOVE) ×1
GLOVE SURG ORTHO 9.0 STRL STRW (GLOVE) ×1
GOWN STRL REUS W/ TWL XL LVL3 (GOWN DISPOSABLE) ×2
GOWN STRL REUS W/TWL XL LVL3 (GOWN DISPOSABLE) ×2
GRAFT SKIN WND MICRO 38 (Tissue) ×1 IMPLANT
KIT BASIN OR (CUSTOM PROCEDURE TRAY) ×1
KIT TURNOVER KIT B (KITS) ×1
MANIFOLD NEPTUNE II (INSTRUMENTS)
NS IRRIG 1000ML POUR BTL (IV SOLUTION) ×1
PACK ORTHO EXTREMITY (CUSTOM PROCEDURE TRAY) ×1
PAD ARMBOARD 7.5X6 YLW CONV (MISCELLANEOUS) ×1
PREVENA RESTOR ARTHOFORM 46X30 (CANNISTER) ×1
SPONGE T-LAP 18X18 ~~LOC~~+RFID (SPONGE) ×1
STAPLER VISISTAT 35W (STAPLE) ×1
STOCKINETTE IMPERVIOUS LG (DRAPES)
SUT ETHILON 2 0 PSLX (SUTURE) ×4
SUT SILK 2 0 (SUTURE) ×1
SUT SILK 2-0 18XBRD TIE 12 (SUTURE) ×1
SUT VIC AB 1 CTX 27 (SUTURE)
TOWEL GREEN STERILE (TOWEL DISPOSABLE) ×1
TUBE CONNECTING 12X1/4 (SUCTIONS) ×1
YANKAUER SUCT BULB TIP NO VENT (SUCTIONS) ×1

## 2022-07-04 NOTE — Anesthesia Procedure Notes (Signed)
Anesthesia Regional Block: Adductor canal block   Pre-Anesthetic Checklist: , timeout performed,  Correct Patient, Correct Site, Correct Laterality,  Correct Procedure, Correct Position, site marked,  Risks and benefits discussed,  Surgical consent,  Pre-op evaluation,  At surgeon's request and post-op pain management  Laterality: Right  Prep: Dura Prep       Needles:  Injection technique: Single-shot  Needle Type: Echogenic Stimulator Needle     Needle Length: 10cm  Needle Gauge: 20     Additional Needles:   Procedures:,,,, ultrasound used (permanent image in chart),,    Narrative:  Start time: 07/04/2022 2:12 PM End time: 07/04/2022 2:16 PM Injection made incrementally with aspirations every 5 mL.  Performed by: Personally  Anesthesiologist: Darral Dash, DO  Additional Notes: Patient identified. Risks/Benefits/Options discussed with patient including but not limited to bleeding, infection, nerve damage, failed block, incomplete pain control. Patient expressed understanding and wished to proceed. All questions were answered. Sterile technique was used throughout the entire procedure. Please see nursing notes for vital signs. Aspirated in 5cc intervals with injection for negative confirmation. Patient was given instructions on fall risk and not to get out of bed. All questions and concerns addressed with instructions to call with any issues or inadequate analgesia.

## 2022-07-04 NOTE — Op Note (Signed)
07/04/2022  3:19 PM  PATIENT:  [name] Cruel Sr.    PRE-OPERATIVE DIAGNOSIS:  abscess right below knee amputation  POST-OPERATIVE DIAGNOSIS:  Same  PROCEDURE:  REVISION AMPUTATION BELOW KNEE Application of Kerecis micro tissue graft 38 cm for wound area 10 x 20 cm.  Application of customizable and Ortho form wound VAC.   SURGEON:  [name], MD  PHYSICIAN ASSISTANT:None ANESTHESIA:   General  PREOPERATIVE INDICATIONS:  [name] is a  61 y.o. male with a diagnosis of abscess right below knee amputation who failed conservative measures and elected for surgical management.    The risks benefits and alternatives were discussed with the patient preoperatively including but not limited to the risks of infection, bleeding, nerve injury, cardiopulmonary complications, the need for revision surgery, among others, and the patient was willing to proceed.  OPERATIVE IMPLANTS: Kerecis micro powder 38 cm.  @ENCIMAGES @  OPERATIVE FINDINGS: Patient had a large abscess the distal 2 cm of the tibia and fibula were resected.  Margins were resected back to healthy viable margins.  OPERATIVE PROCEDURE: Patient was brought the operating room and underwent general anesthetic.  After adequate levels anesthesia were obtained patient's right lower extremity was prepped using DuraPrep draped into a sterile field a timeout was called.  A fishmouth incision was made around the amputated residual limb.  There is a large purulent abscess.  The tissue margins were cut back to tissue that was not involved with the abscess.  The distal 2 cm of the tibia and fibula were resected and beveled anteriorly.  The wound was irrigated normal saline electrocautery was used for hemostasis.  The tissue margins were clear.  The 38 cm Kerecis micro graft was applied to the wound area of 10 x 20 cm.  The wound was closed with 2-0 nylon and staples.  A Praveena customizable and Arnell Sieving form wound vacs were applied this  had a good suction fit patient was extubated taken the PACU in stable condition.   DISCHARGE PLANNING:  Antibiotic duration: Continue IV antibiotics with dialysis  Weightbearing: Nonweightbearing on the right  Pain medication: Opioid pathway  Dressing care/ Wound VAC: Continue wound VAC for 1 week  Ambulatory devices: Walker or wheelchair  Discharge to: Anticipate discharge to skilled nursing.  Follow-up: In the office 1 week post operative.

## 2022-07-04 NOTE — Anesthesia Preprocedure Evaluation (Addendum)
Anesthesia Evaluation  Patient identified by MRN, date of birth, ID band Patient awake    Reviewed: Allergy & Precautions, NPO status , Patient's Chart, lab work & pertinent test results  Airway Mallampati: III  TM Distance: >3 FB Neck ROM: Full    Dental no notable dental hx.    Pulmonary COPD,  COPD inhaler,    Pulmonary exam normal        Cardiovascular hypertension, Pt. on medications + CAD, + Past MI and +CHF  + dysrhythmias  Rhythm:Regular Rate:Normal     Neuro/Psych negative neurological ROS  negative psych ROS   GI/Hepatic Neg liver ROS, GERD  Medicated,  Endo/Other  diabetes, Type 2, Insulin Dependent  Renal/GU ESRF and DialysisRenal disease     Musculoskeletal negative musculoskeletal ROS (+)   Abdominal Normal abdominal exam  (+)   Peds  Hematology  (+) Blood dyscrasia, anemia , Lab Results      Component                Value               Date                      WBC                      9.3                 07/04/2022                HGB                      10.2 (L)            07/04/2022                HCT                      33.9 (L)            07/04/2022                MCV                      79.0 (L)            07/04/2022                PLT                      380                 07/04/2022           Lab Results      Component                Value               Date                      NA                       132 (L)             07/04/2022                K  3.5                 07/04/2022                CO2                      27                  07/04/2022                GLUCOSE                  227 (H)             07/04/2022                BUN                      28 (H)              07/04/2022                CREATININE               6.46 (H)            07/04/2022                CALCIUM                  9.1                 07/04/2022                GFRNONAA                  9 (L)               07/04/2022             Anesthesia Other Findings   Reproductive/Obstetrics                            Anesthesia Physical Anesthesia Plan  ASA: 4  Anesthesia Plan: General and Regional   Post-op Pain Management: Regional block*   Induction: Intravenous  PONV Risk Score and Plan: 2 and Ondansetron, Midazolam and Treatment may vary due to age or medical condition  Airway Management Planned: Mask and LMA  Additional Equipment: None  Intra-op Plan:   Post-operative Plan: Extubation in OR  Informed Consent: I have reviewed the patients History and Physical, chart, labs and discussed the procedure including the risks, benefits and alternatives for the proposed anesthesia with the patient or authorized representative who has indicated his/her understanding and acceptance.     Dental advisory given  Plan Discussed with: CRNA  Anesthesia Plan Comments: (ECHO 03/23: IMPRESSIONS 1. Left ventricular ejection fraction, by estimation, is 45 to 50%. The left ventricle has mildly decreased function. The left ventricle demonstrates global hypokinesis. There is moderate left ventricular hypertrophy. Left ventricular diastolic function could not be evaluated. 2. Right ventricular systolic function is mildly reduced. The right ventricular size is normal. 3. The mitral valve is degenerative. Mild mitral valve regurgitation. No evidence of mitral stenosis. Moderate to severe mitral annular calcification. 4. The aortic valve is tricuspid. There is moderate calcification of the aortic valve. There is moderate thickening of the aortic valve. Aortic valve regurgitation is not visualized. Mild aortic valve stenosis. Aortic valve area, by VTI measures  2.26  cm. Aortic valve mean gradient measures 8.0 mmHg. Aortic valve Vmax measures 1.92 m/s.  Conclusion(s)/Recommendation(s): No evidence of valvular vegetations on this transthoracic  echocardiogram. Consider a transesophageal echocardiogram to exclude infective endocarditis if clinically indicated.)       Anesthesia Quick Evaluation

## 2022-07-04 NOTE — Anesthesia Postprocedure Evaluation (Signed)
Anesthesia Post Note  Patient: [name] ERNEY Sr.  Procedure(s) Performed: REVISION AMPUTATION BELOW KNEE (Right: Knee)     Patient location during evaluation: PACU Anesthesia Type: Regional and General Level of consciousness: awake and alert Pain management: pain level controlled Vital Signs Assessment: post-procedure vital signs reviewed and stable Respiratory status: spontaneous breathing, nonlabored ventilation, respiratory function stable and patient connected to nasal cannula oxygen Cardiovascular status: blood pressure returned to baseline and stable Postop Assessment: no apparent nausea or vomiting Anesthetic complications: no   No notable events documented.  Last Vitals:  Vitals:   07/04/22 1608 07/04/22 1718  BP: 115/75 126/73  Pulse: 73 100  Resp: 18 17  Temp: 36.8 C 36.6 C  SpO2: 94% 100%    Last Pain:  Vitals:   07/04/22 1530  TempSrc:   PainSc: 0-No pain                 Belenda Cruise P Bobak Oguinn

## 2022-07-04 NOTE — Progress Notes (Signed)
Pharmacy Antibiotic Note  [name] CAMPS Sr. is a 61 y.o. male admitted on 07/01/2022 with  R stump infection s/p R foot OM and BKA.   Pharmacy has been consulted for vancomycin dosing.  Patient has ESRD on HD - last session on 8/17 x4 hours with BFR 450.received 1 gram of vancomycin after.   Plans for revision of BKA with Dr. Sharol Given today.   Plan: Continue vancomycin 1 gram IV after HD on TTS Continue ceftriaxone and metronidazole as dosed per MD  F/u surgery, clinical status, cultures F/u abx LOT and de-escalation as possible   Height: 6\' 1"  (185.4 cm) Weight: 99.8 kg (220 lb 0.3 oz) IBW/kg (Calculated) : 79.9  Temp (24hrs), Avg:98.3 F (36.8 C), Min:97.6 F (36.4 C), Max:98.8 F (37.1 C)  Recent Labs  Lab 07/01/22 1344 07/02/22 0100 07/03/22 0322 07/04/22 0836  WBC 13.3* 14.0* 10.0 9.3  CREATININE 11.87* 7.34*  7.26* 8.54* 6.46*  LATICACIDVEN 1.3 1.7  --   --     Estimated Creatinine Clearance: 15.3 mL/min (A) (by C-G formula based on SCr of 6.46 mg/dL (H)).    Allergies  Allergen Reactions   Sulfa Antibiotics Nausea And Vomiting and Other (See Comments)    "Allergic," per MAR    Antimicrobials this admission: 8/15 vanc>> 8/15 ceftriaxone>> 8/15 metronidazole >> (8/22)  Microbiology results: Negative GI panel  8/15 BCX: ngx3 days  8/15 UCX: negative  8/16 [place] ngtd  Thank you for allowing pharmacy to be a part of this patient's care.  Maryan Char Delorean Knutzen, PharmD, BCPS, BCOP 07/04/2022 1:55 PM

## 2022-07-04 NOTE — Progress Notes (Addendum)
PROGRESS NOTE    [name] Cruel Sr.  URK:[id number] DOB: 12-04-1960 DOA: 07/01/2022 PCP: Pcp, No    Chief Complaint  Patient presents with   Wound Infection    Brief Narrative:  [name] Cruel Sr. is a 60 y.o. male with PMH significant for ESRD-HD-TTS, DM2, HTN, HLD, CAD/MI, recent right BKA Patient was brought to the ED on 8/15 from [facility] for evaluation of redness, drainage from his amputation site concerning for possible infection.   Patient recently underwent right BKA on 7/7 by Dr. Sharol Given for right foot osteomyelitis.  While at the rehab, he was noted to have progressive worsening of redness, drainage and pain at the site of amputation and hence sent to the ED.   In the ED, he had an initial temperature of 100.9, heart rate 102, blood pressure 146/81, 90% O2 sat on room air. Labs showed WBC of 13.3, hemoglobin 9.3, BNP more than 1600, lactic acid 1.3, glucose low at 56. CXR with cardiomegaly and pulmonary congestion.    In the ED, patient was started on vancomycin/Rocephin/ flagyl.   Admitted to hospitalist service.   Assessment & Plan:   Principal Problem:   Cellulitis of right leg Active Problems:   DM2 (diabetes mellitus, type 2) (HCC)   ESRD (end stage renal disease) (HCC)   HTN (hypertension)   Chronic systolic CHF (congestive heart failure) (HCC)   GERD (gastroesophageal reflux disease)   Pressure injury of skin   Dehiscence of amputation stump of right lower extremity (HCC)   Abscess of right lower leg   Diarrhea  #1 status post right BKA with amputation stump infection/wound dehiscence/?  Abscess -Patient status post right BKA 05/24/19/2023, sent from SNF with erythema, drainage, fever concerning for abscess versus cellulitis. -Patient seen in consultation by orthopedics, Dr. Sharol Given who discussed with patient and patient would not consider an amputation above the knee at this time and patient plan for revision of right transtibial amputation in the OR  today. -Continue empiric IV vancomycin, IV Rocephin, Flagyl. -We will consult with ID for antibiotic duration and recommendations pending or findings. -Follow.  2.  End-stage renal disease HD-TTS -Patient noted to have missed dialysis on presentation in the ED. -Nephrology following and patient receiving hemodialysis. -Per nephrology.  3.  Diarrhea -Patient multiple loose stools noted. -C. difficile PCR negative, GI pathogen panel negative. -Discontinue enteric precautions. -Imodium as needed.  4.  Poorly controlled type 2 diabetes mellitus/hyperglycemia -Hemoglobin A1c 8.2 on 05/24/2022. -Patient noted earlier in the hospitalization to have low blood glucose levels of 51. -Lantus dose decreased to 25 units daily, SSI. -Diabetes coordinator following.  5.  Chronic systolic CHF/hypertension -Stable. -Continue Norvasc daily.  6.  History of CAD/MI -Aspirin.  7.  GERD -PPI.  8.  Hypomagnesemia -Magnesium at 1.6. -Likely secondary to GI losses. -Magnesium sulfate 4 g IV x1. -Repeat labs in the AM.  9.  Stage III left medial pressure injury buttocks, POA Pressure Injury 07/02/22 Buttocks Left;Medial Stage 3 -  Full thickness tissue loss. Subcutaneous fat may be visible but bone, tendon or muscle are NOT exposed. (Active)  07/02/22 0058  Location: Buttocks  Location Orientation: Left;Medial  Staging: Stage 3 -  Full thickness tissue loss. Subcutaneous fat may be visible but bone, tendon or muscle are NOT exposed.  Wound Description (Comments):   Present on Admission: Yes         DVT prophylaxis: Heparin Code Status: Full Family Communication: Updated patient.  No family at bedside.  Disposition: Back to skilled nursing facility when medically stable and cleared by orthopedics and ID.  Status is: Inpatient Remains inpatient appropriate because: Severity of illness   Consultants:  Orthopedics: Dr. Sharol Given 07/03/2022 Nephrology: Dr. Candiss Norse 07/01/2022  Procedures: Chest  x-ray 07/01/2022 Plain films of the right knee 07/01/2022 Transfusion 1 unit packed red blood cells 07/04/2022 preop  Antimicrobials:  IV Rocephin 07/01/2022>>>> 07/08/2022 IV vancomycin 07/01/2022>>>>> Oral Flagyl 07/01/2022>>>> 07/08/2022   Subjective: Patient laying in bed currently receiving a unit of packed red blood cells.  Denies any chest pain.  No shortness of breath.  No abdominal pain.  Patient to go to the OR this afternoon with Dr. Sharol Given.  Objective: Vitals:   07/04/22 1200 07/04/22 1346 07/04/22 1406 07/04/22 1410  BP: 126/71 134/66 126/68 127/71  Pulse: 76 80 76 74  Resp: 19 18 18 17   Temp: 98.3 F (36.8 C) 98.3 F (36.8 C)    TempSrc: Oral Oral    SpO2: 98% 97% 96% 96%  Weight:  99.8 kg    Height:  6\' 1"  (1.854 m)      Intake/Output Summary (Last 24 hours) at 07/04/2022 1440 Last data filed at 07/04/2022 1100 Gross per 24 hour  Intake 798 ml  Output 3000 ml  Net -2202 ml   Filed Weights   07/04/22 0157 07/04/22 0452 07/04/22 1346  Weight: 99.8 kg 99.8 kg 99.8 kg    Examination:  General exam: Appears calm and comfortable. Respiratory system: Clear to auscultation anterior lung fields. Respiratory effort normal. Cardiovascular system: S1 & S2 heard, RRR. No JVD, murmurs, rubs, gallops or clicks. No pedal edema. Gastrointestinal system: Abdomen is nondistended, soft and nontender. No organomegaly or masses felt. Normal bowel sounds heard. Central nervous system: Alert and oriented. No focal neurological deficits. Extremities: Right BKA with Ace wrap noted with some slight discharge noted on dressing. Skin: No rashes, lesions or ulcers Psychiatry: Judgement and insight appear normal. Mood & affect appropriate.     Data Reviewed: I have personally reviewed following labs and imaging studies  CBC: Recent Labs  Lab 07/01/22 1344 07/02/22 0100 07/03/22 0322 07/04/22 0836  WBC 13.3* 14.0* 10.0 9.3  NEUTROABS 9.6*  --  6.3 5.3  HGB 9.3* 11.1* 9.3* 10.2*   HCT 30.8* 37.1* 31.8* 33.9*  MCV 79.0* 80.5 81.1 79.0*  PLT 304 342 291 595    Basic Metabolic Panel: Recent Labs  Lab 07/01/22 1344 07/02/22 0100 07/03/22 0322 07/04/22 0836  NA 135 135  135 135 132*  K 4.7 3.6  3.6 3.2* 3.5  CL 95* 90*  90* 98 92*  CO2 25 29  29 26 27   GLUCOSE 56* 82  81 134* 227*  BUN 76* 42*  40* 50* 28*  CREATININE 11.87* 7.34*  7.26* 8.54* 6.46*  CALCIUM 9.3 9.4  9.4 8.6* 9.1  MG  --   --   --  1.6*  PHOS  --  3.4  --   --     GFR: Estimated Creatinine Clearance: 15.3 mL/min (A) (by C-G formula based on SCr of 6.46 mg/dL (H)).  Liver Function Tests: Recent Labs  Lab 07/01/22 1344 07/02/22 0100  AST 19  --   ALT 17  --   ALKPHOS 174*  --   BILITOT 2.1*  --   PROT 8.3*  --   ALBUMIN 2.6* 3.1*    CBG: Recent Labs  Lab 07/03/22 1111 07/03/22 2056 07/04/22 0730 07/04/22 1136 07/04/22 1433  GLUCAP 144* 105* 224* 145*  163*     Recent Results (from the past 240 hour(s))  Urine Culture     Status: None   Collection Time: 07/01/22 11:44 AM   Specimen: In/Out Cath Urine  Result Value Ref Range Status   Specimen Description   Final    IN/OUT CATH URINE Performed at [facility] [address], [place], [place] [address]    Special Requests   Final    NONE Performed at [facility], [place] [address], [place], [place] [address]    Culture   Final    NO GROWTH Performed at [facility], [place] [address] [place], [place] [address]    Report Status 07/04/2022 FINAL  Final  SARS Coronavirus 2 by RT PCR (hospital order, performed in [facility] hospital lab) *cepheid single result test* Anterior Nasal Swab     Status: None   Collection Time: 07/01/22 11:47 AM   Specimen: Anterior Nasal Swab  Result Value Ref Range Status   SARS Coronavirus 2 by RT PCR NEGATIVE NEGATIVE Final    Comment: (NOTE) SARS-CoV-2 target nucleic acids are NOT DETECTED.  The SARS-CoV-2 RNA is generally  detectable in upper and lower respiratory specimens during the acute phase of infection. The lowest concentration of SARS-CoV-2 viral copies this assay can detect is 250 copies / mL. A negative result does not preclude SARS-CoV-2 infection and should not be used as the sole basis for treatment or other patient management decisions.  A negative result may occur with improper specimen collection / handling, submission of specimen other than nasopharyngeal swab, presence of viral mutation(s) within the areas targeted by this assay, and inadequate number of viral copies (<250 copies / mL). A negative result must be combined with clinical observations, patient history, and epidemiological information.  Fact Sheet for Patients:   https://www.patel.info/  Fact Sheet for Healthcare Providers: https://hall.com/  This test is not yet approved or  cleared by the [place] FDA and has been authorized for detection and/or diagnosis of SARS-CoV-2 by FDA under an Emergency Use Authorization (EUA).  This EUA will remain in effect (meaning this test can be used) for the duration of the COVID-19 declaration under Section 564(b)(1) of the Act, 21 U.S.C. section 360bbb-3(b)(1), unless the authorization is terminated or revoked sooner.  Performed at [facility], [place] [address], [place], [place] [address]   Blood Culture (routine x 2)     Status: None (Preliminary result)   Collection Time: 07/01/22  1:44 PM   Specimen: BLOOD  Result Value Ref Range Status   Specimen Description   Final    BLOOD BLOOD RIGHT FOREARM Performed at [facility] [address] [place], [place] [address]    Special Requests   Final    BOTTLES DRAWN AEROBIC AND ANAEROBIC Blood Culture results may not be optimal due to an excessive volume of blood received in culture bottles Performed at [facility] [address], [place], [place] [address]    Culture   Final    NO GROWTH 3 DAYS Performed at [facility], [place] [address], [place],  [address]    Report Status PENDING  Incomplete  Blood Culture (routine x 2)     Status: None (Preliminary result)   Collection Time: 07/02/22  1:00 AM   Specimen: BLOOD RIGHT FOREARM  Result Value Ref Range Status   Specimen Description   Final    BLOOD RIGHT FOREARM Performed  at [facility], [place] [address], [place], [place] [address]    Special Requests   Final    BOTTLES DRAWN AEROBIC ONLY Blood Culture adequate volume Performed at [facility] [address], [place], [place] [address]    Culture   Final    NO GROWTH 2 DAYS Performed at [facility] [address] [place], [place] [address]    Report Status PENDING  Incomplete  MRSA Next Gen by PCR, Nasal     Status: Abnormal   Collection Time: 07/02/22  6:51 AM   Specimen: Nasal Mucosa; Nasal Swab  Result Value Ref Range Status   MRSA by PCR Next Gen DETECTED (A) NOT DETECTED Final    Comment: (NOTE) The GeneXpert MRSA Assay (FDA approved for NASAL specimens only), is one component of a comprehensive MRSA colonization surveillance program. It is not intended to diagnose MRSA infection nor to guide or monitor treatment for MRSA infections. Test performance is not FDA approved in patients less than 62 years old. Performed at [facility], [place] [address] [place], [place] [address]   C Difficile Quick Screen w PCR reflex     Status: None   Collection Time: 07/02/22 11:27 PM   Specimen: STOOL  Result Value Ref Range Status   C Diff antigen NEGATIVE NEGATIVE Final   C Diff toxin NEGATIVE NEGATIVE Final   C Diff interpretation No C. difficile detected.  Final    Comment: Performed at [facility], [place] [address], [place], [place] [address]  Gastrointestinal Panel by PCR , Stool     Status: None   Collection  Time: 07/02/22 11:27 PM   Specimen: STOOL  Result Value Ref Range Status   Campylobacter species NOT DETECTED NOT DETECTED Final   Plesimonas shigelloides NOT DETECTED NOT DETECTED Final   Salmonella species NOT DETECTED NOT DETECTED Final   Yersinia enterocolitica NOT DETECTED NOT DETECTED Final   Vibrio species NOT DETECTED NOT DETECTED Final   Vibrio cholerae NOT DETECTED NOT DETECTED Final   Enteroaggregative E coli (EAEC) NOT DETECTED NOT DETECTED Final   Enteropathogenic E coli (EPEC) NOT DETECTED NOT DETECTED Final   Enterotoxigenic E coli (ETEC) NOT DETECTED NOT DETECTED Final   Shiga like toxin producing E coli (STEC) NOT DETECTED NOT DETECTED Final   Shigella/Enteroinvasive E coli (EIEC) NOT DETECTED NOT DETECTED Final   Cryptosporidium NOT DETECTED NOT DETECTED Final   Cyclospora cayetanensis NOT DETECTED NOT DETECTED Final   Entamoeba histolytica NOT DETECTED NOT DETECTED Final   Giardia lamblia NOT DETECTED NOT DETECTED Final   Adenovirus F40/41 NOT DETECTED NOT DETECTED Final   Astrovirus NOT DETECTED NOT DETECTED Final   Norovirus GI/GII NOT DETECTED NOT DETECTED Final   Rotavirus A NOT DETECTED NOT DETECTED Final   Sapovirus (I, II, IV, and V) NOT DETECTED NOT DETECTED Final    Comment: Performed at [facility], [address] [place], [place] [address]         Radiology Studies: No results found.      Scheduled Meds:  [MAR Hold] (feeding supplement) PROSource Plus  30 mL Oral BID BM   [MAR Hold] aspirin EC  81 mg Oral Daily   [MAR Hold] benzonatate  100 mg Oral TID   [MAR Hold] Chlorhexidine Gluconate Cloth  6 each Topical Q0600   [MAR Hold] feeding supplement (NEPRO CARB STEADY)  237 mL Oral BID BM   [MAR Hold] furosemide  40 mg  Oral Daily   [MAR Hold] guaiFENesin  1,200 mg Oral BID   [MAR Hold] heparin  5,000 Units Subcutaneous Q8H   [MAR Hold] insulin aspart  0-20 Units Subcutaneous TID WC   [MAR Hold] insulin glargine-yfgn  25 Units  Subcutaneous QHS   [MAR Hold] metroNIDAZOLE  500 mg Oral Q12H   midazolam       [MAR Hold] multivitamin  1 tablet Oral QHS   [MAR Hold] nutrition supplement (JUVEN)  1 packet Oral BID BM   [MAR Hold] pantoprazole  40 mg Oral Daily   [MAR Hold] senna-docusate  1 tablet Oral BID   [MAR Hold] sodium chloride flush  3 mL Intravenous Q12H   [MAR Hold] sucroferric oxyhydroxide  500 mg Oral TID WC   Continuous Infusions:  [MAR Hold] sodium chloride     sodium chloride 10 mL/hr at 07/04/22 1418   [MAR Hold] cefTRIAXone (ROCEPHIN)  IV Stopped (07/03/22 1957)   magnesium sulfate bolus IVPB     [MAR Hold] vancomycin       LOS: 3 days    Time spent: 40 minutes    Irine Seal, MD [facility]   To contact the attending provider between 7A-7P or the covering provider during after hours 7P-7A, please log into the web site [email or web address] and access using universal [place] password for that web site. If you do not have the password, please call the hospital operator.  07/04/2022, 2:40 PM

## 2022-07-04 NOTE — Consult Note (Signed)
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, complicated by surgical site infection admitted 8/15 due to sepsis, awaiting revision bka as patient had refused aka  Patient in OR now  He is on empiric abx  Depending on culture and surgical finding will decide targetted therapy and length of treatment whether it be soft tissue process alone or with osseous involvement     Plan: Continue current antimicrobial F/u operative culture and surgical finding 2 vs 6 weeks with targetted therapy pending above findings Discussed with primary team         ------------------------------------------------ Principal Problem:   Cellulitis of right leg Active Problems:   ESRD (end stage renal disease) ([place])   DM2 (diabetes mellitus, type 2) ([place])   HTN (hypertension)   Chronic systolic CHF (congestive heart failure) ([place])   GERD (gastroesophageal reflux disease)   Pressure injury of skin   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 is a 61 y.o. male admitted for left bka stump infection  Patient had bka for right foot diabetic infection on 7/07; no postop abx course given. Prior to that had failed chronic OM of the right foot tx. He had developed early post-op wound infection and admitted 8/15 for evaluation  He presented with fever and purulence/dehiscence of the right bka stump  He is currently in the OR right now but was started on vanc/cefttriaxone/flagyl in the ED His admission bcx have been negative His mrsa nares screen had returned positive He has no leukocytosis at presentation  He has "expected surgical changes of  the right bka stump" on xray    Family History  Problem Relation Age of Onset   Hypertension Mother     Social History   Tobacco Use   Smoking status: Never   Smokeless tobacco: Never  Vaping Use   Vaping Use: Never used  Substance Use Topics   Alcohol use: Not Currently   Drug use: Never    Allergies  Allergen Reactions   Sulfa Antibiotics Nausea And Vomiting and Other (See Comments)    "Allergic," per MAR    Review of Systems: ROS All Other ROS was negative, except mentioned above   Past Medical History:  Diagnosis Date   CHF (congestive heart failure) ([place])    ECHO 02/05/22  EF 45-50%, DD indeterminent   Diabetes mellitus without complication ([place])    type 2   ESRD on hemodialysis ([place])    mon wed fri   Hypertension    Kidney failure    Myocardial infarction ([place])    Nonischemic cardiomyopathy ([place])        Scheduled Meds:  [MAR Hold] (feeding supplement) PROSource Plus  30 mL Oral BID BM   [MAR Hold] aspirin EC  81 mg Oral Daily   [MAR Hold] benzonatate  100 mg Oral TID   [MAR Hold] Chlorhexidine Gluconate Cloth  6 each Topical Q0600   [MAR Hold] feeding supplement (NEPRO CARB STEADY)  237 mL Oral BID BM   [MAR Hold] furosemide  40 mg Oral  Daily   [MAR Hold] guaiFENesin  1,200 mg Oral BID   [MAR Hold] heparin  5,000 Units Subcutaneous Q8H   [MAR Hold] insulin aspart  0-20 Units Subcutaneous TID WC   [MAR Hold] insulin glargine-yfgn  25 Units Subcutaneous QHS   [MAR Hold] metroNIDAZOLE  500 mg Oral Q12H   midazolam       [MAR Hold] multivitamin  1 tablet Oral QHS   [MAR Hold] nutrition supplement (JUVEN)  1 packet Oral BID BM   [MAR Hold] pantoprazole  40 mg Oral Daily   [MAR Hold] senna-docusate  1 tablet Oral BID   [MAR Hold] sodium chloride flush  3 mL Intravenous Q12H   [MAR Hold] sucroferric oxyhydroxide  500 mg Oral TID WC   Continuous Infusions:  [MAR Hold] sodium chloride     sodium chloride 10 mL/hr at 07/04/22 1418   [MAR Hold]  cefTRIAXone (ROCEPHIN)  IV Stopped (07/03/22 1957)   magnesium sulfate bolus IVPB     [MAR Hold] vancomycin     PRN Meds:.[MAR Hold] sodium chloride, [MAR Hold] acetaminophen **OR** [MAR Hold] acetaminophen, [MAR Hold] ipratropium-albuterol, [MAR Hold] loperamide, midazolam, [MAR Hold] ondansetron, [MAR Hold] oxyCODONE-acetaminophen, [MAR Hold] sodium chloride flush   OBJECTIVE: Blood pressure 127/71, pulse 74, temperature 98.3 F (36.8 C), temperature source Oral, resp. rate 17, height 6\' 1"  (1.854 m), weight 99.8 kg, SpO2 96 %.  Physical Exam  Patient in OR at this time  Lab Results Lab Results  Component Value Date   WBC 9.3 07/04/2022   HGB 10.2 (L) 07/04/2022   HCT 33.9 (L) 07/04/2022   MCV 79.0 (L) 07/04/2022   PLT 380 07/04/2022    Lab Results  Component Value Date   CREATININE 6.46 (H) 07/04/2022   BUN 28 (H) 07/04/2022   NA 132 (L) 07/04/2022   K 3.5 07/04/2022   CL 92 (L) 07/04/2022   CO2 27 07/04/2022    Lab Results  Component Value Date   ALT 17 07/01/2022   AST 19 07/01/2022   ALKPHOS 174 (H) 07/01/2022   BILITOT 2.1 (H) 07/01/2022      Microbiology: Recent Results (from the past 240 hour(s))  Urine Culture     Status: None   Collection Time: 07/01/22 11:44 AM   Specimen: In/Out Cath Urine  Result Value Ref Range Status   Specimen Description   Final    IN/OUT CATH URINE Performed at [facility] [address], [place], [place] [address]    Special Requests   Final    NONE Performed at [facility], [place] [address], [place], [place] [address]    Culture   Final    NO GROWTH Performed at [facility], [place] [address] [place] [address], [place], [place] [address]    Report Status 07/04/2022 FINAL  Final  SARS Coronavirus 2 by RT PCR (hospital order, performed in [facility] hospital lab) *cepheid single result test* Anterior Nasal Swab     Status: None   Collection Time: 07/01/22 11:47 AM   Specimen: Anterior Nasal  Swab  Result Value Ref Range Status   SARS Coronavirus 2 by RT PCR NEGATIVE NEGATIVE Final    Comment: (NOTE) SARS-CoV-2 target nucleic acids are NOT DETECTED.  The SARS-CoV-2 RNA is generally detectable in upper and lower respiratory specimens during the acute phase of infection. The lowest concentration of SARS-CoV-2 viral copies this assay can detect is 250 copies / mL. A negative result does not preclude SARS-CoV-2 infection and should  not be used as the sole basis for treatment or other patient management decisions.  A negative result may occur with improper specimen collection / handling, submission of specimen other than nasopharyngeal swab, presence of viral mutation(s) within the areas targeted by this assay, and inadequate number of viral copies (<250 copies / mL). A negative result must be combined with clinical observations, patient history, and epidemiological information.  Fact Sheet for Patients:   https://www.patel.info/  Fact Sheet for Healthcare Providers: https://hall.com/  This test is not yet approved or  cleared by the [place] FDA and has been authorized for detection and/or diagnosis of SARS-CoV-2 by FDA under an Emergency Use Authorization (EUA).  This EUA will remain in effect (meaning this test can be used) for the duration of the COVID-19 declaration under Section 564(b)(1) of the Act, 21 U.S.C. section 360bbb-3(b)(1), unless the authorization is terminated or revoked sooner.  Performed at [facility], [place] [address], [place], [place] [address]   Blood Culture (routine x 2)     Status: None (Preliminary result)   Collection Time: 07/01/22  1:44 PM   Specimen: BLOOD  Result Value Ref Range Status   Specimen Description   Final    BLOOD BLOOD RIGHT FOREARM Performed at [facility] [address] [place], [place] [address]    Special Requests   Final    BOTTLES  DRAWN AEROBIC AND ANAEROBIC Blood Culture results may not be optimal due to an excessive volume of blood received in culture bottles Performed at [facility] [address] [place], [place] [address]    Culture   Final    NO GROWTH 3 DAYS Performed at [facility], [place] [address] [place], [place] [address]    Report Status PENDING  Incomplete  Blood Culture (routine x 2)     Status: None (Preliminary result)   Collection Time: 07/02/22  1:00 AM   Specimen: BLOOD RIGHT FOREARM  Result Value Ref Range Status   Specimen Description   Final    BLOOD RIGHT FOREARM Performed at [facility] [address], [place], [place] [address]    Special Requests   Final    BOTTLES DRAWN AEROBIC ONLY Blood Culture adequate volume Performed at [facility] [address], [place], [place] [address]    Culture   Final    NO GROWTH 2 DAYS Performed at [facility] [address] [place],  [address]    Report Status PENDING  Incomplete  MRSA Next Gen by PCR, Nasal     Status: Abnormal   Collection Time: 07/02/22  6:51 AM   Specimen: Nasal Mucosa; Nasal Swab  Result Value Ref Range Status   MRSA by PCR Next Gen DETECTED (A) NOT DETECTED Final    Comment: (NOTE) The GeneXpert MRSA Assay (FDA approved for NASAL specimens only), is one component of a comprehensive MRSA colonization surveillance program. It is not intended to diagnose MRSA infection nor to guide or monitor treatment for MRSA infections. Test performance is not FDA approved in patients less than 26 years old. Performed at [facility], [place] [name] [place],  [address]   C Difficile Quick Screen w PCR reflex     Status: None   Collection Time: 07/02/22 11:27 PM   Specimen: STOOL  Result Value Ref Range Status   C Diff antigen NEGATIVE NEGATIVE Final   C Diff toxin NEGATIVE NEGATIVE Final  C Diff interpretation No C.  difficile detected.  Final    Comment: Performed at [facility], [place] [address], [place], [place] [address]  Gastrointestinal Panel by PCR , Stool     Status: None   Collection Time: 07/02/22 11:27 PM   Specimen: STOOL  Result Value Ref Range Status   Campylobacter species NOT DETECTED NOT DETECTED Final   Plesimonas shigelloides NOT DETECTED NOT DETECTED Final   Salmonella species NOT DETECTED NOT DETECTED Final   Yersinia enterocolitica NOT DETECTED NOT DETECTED Final   Vibrio species NOT DETECTED NOT DETECTED Final   Vibrio cholerae NOT DETECTED NOT DETECTED Final   Enteroaggregative E coli (EAEC) NOT DETECTED NOT DETECTED Final   Enteropathogenic E coli (EPEC) NOT DETECTED NOT DETECTED Final   Enterotoxigenic E coli (ETEC) NOT DETECTED NOT DETECTED Final   Shiga like toxin producing E coli (STEC) NOT DETECTED NOT DETECTED Final   Shigella/Enteroinvasive E coli (EIEC) NOT DETECTED NOT DETECTED Final   Cryptosporidium NOT DETECTED NOT DETECTED Final   Cyclospora cayetanensis NOT DETECTED NOT DETECTED Final   Entamoeba histolytica NOT DETECTED NOT DETECTED Final   Giardia lamblia NOT DETECTED NOT DETECTED Final   Adenovirus F40/41 NOT DETECTED NOT DETECTED Final   Astrovirus NOT DETECTED NOT DETECTED Final   Norovirus GI/GII NOT DETECTED NOT DETECTED Final   Rotavirus A NOT DETECTED NOT DETECTED Final   Sapovirus (I, II, IV, and V) NOT DETECTED NOT DETECTED Final    Comment: Performed at [facility], [address], [place], [place] [address]     Serology:    Imaging: If present, new imagings (plain films, ct scans, and mri) have been personally visualized and interpreted; radiology reports have been reviewed. Decision making incorporated into the Impression / Recommendations.  8/15 xray right knee Expected post-op changes  Jabier Mutton, [facility] for Infectious [place] [phone] pager    07/04/2022, 3:10  PM

## 2022-07-04 NOTE — Transfer of Care (Signed)
Immediate Anesthesia Transfer of Care Note  Patient: [name] Cruel Sr.  Procedure(s) Performed: REVISION AMPUTATION BELOW KNEE (Right: Knee)  Patient Location: PACU  Anesthesia Type:GA combined with regional for post-op pain  Level of Consciousness: awake, alert  and oriented  Airway & Oxygen Therapy: Patient Spontanous Breathing and Patient connected to nasal cannula oxygen  Post-op Assessment: Report given to RN and Post -op Vital signs reviewed and stable  Post vital signs: Reviewed and stable  Last Vitals:  Vitals Value Taken Time  BP 119/72 07/04/22 1509  Temp    Pulse 65 07/04/22 1514  Resp 26 07/04/22 1514  SpO2 99 % 07/04/22 1514  Vitals shown include unvalidated device data.  Last Pain:  Vitals:   07/04/22 1410  TempSrc:   PainSc: 0-No pain      Patients Stated Pain Goal: 0 (23/55/73 2202)  Complications: No notable events documented.

## 2022-07-04 NOTE — Progress Notes (Signed)
[facility] Progress Note    Assessment/ Plan:   ESRD:  -outpatient HD orders: [facility] TTS. 4hrs 63min. F180. Flow rates: 450/500. EDW 109kg. 2K/2.25Cal. AVF. Meds: Heparin 6000 unit bolus; Mircera 250mcg q2weeks (last dose 8/12); Venofer 50mg  qweekly -will c/w HD on TTS schedule, HD tomorrow with no heparin tomorrow (given surgery today)   Wound infection/cellulitis -abx per primary service, ortho consulted. Patient refused AKA. Stump revision planned for today   Volume/ hypertension:  -BP acceptable. UF as tolerated   Anemia of Chronic Kidney Disease:  -next dose of ESA due on 8/26, hgb 10.2 -avoid IV Fe for now -transfuse prn for hgb <7   Secondary Hyperparathyroidism/Hyperphosphatemia: not on VDRA. Will resume home velphoro. PO4 acceptable/WNL   DM2 -per primary  Diarrhea -Per primary, cdiff neg  Subjective:   Patient seen and examined in room. Transferred from [facility]. Pending revision of stump today. Finishing up PRBC transfusion upon my encounter (hgb 10.2 this am) Otherwise no complaints Tolerated HD yesterday with net uf 3L.   Objective:   BP (!) 123/97 (BP Location: Right Arm)   Pulse 78   Temp 98.2 F (36.8 C) (Oral)   Resp 18   Ht 6\' 1"  (1.854 m)   Wt 99.8 kg   SpO2 98%   BMI 29.03 kg/m   Intake/Output Summary (Last 24 hours) at 07/04/2022 1101 Last data filed at 07/04/2022 0800 Gross per 24 hour  Intake 480 ml  Output 3000 ml  Net -2520 ml   Weight change: -5.3 kg  Physical Exam: Gen:NAD CVS:RRR Resp:normal WOB, CTA BL MLY:YTKP, nt/nd Ext:no sig edema LLE, RLE stump dressing in place Neuro: awake, alert Dialysis access: LUE AVF +b/t  Imaging: No results found.  Labs: BMET Recent Labs  Lab 07/01/22 1344 07/02/22 0100 07/03/22 0322 07/04/22 0836  NA 135 135  135 135 132*  K 4.7 3.6  3.6 3.2* 3.5  CL 95* 90*  90* 98 92*  CO2 25 29  29 26 27   GLUCOSE 56* 82  81 134* 227*  BUN 76* 42*  40* 50* 28*  CREATININE  11.87* 7.34*  7.26* 8.54* 6.46*  CALCIUM 9.3 9.4  9.4 8.6* 9.1  PHOS  --  3.4  --   --    CBC Recent Labs  Lab 07/01/22 1344 07/02/22 0100 07/03/22 0322 07/04/22 0836  WBC 13.3* 14.0* 10.0 9.3  NEUTROABS 9.6*  --  6.3 5.3  HGB 9.3* 11.1* 9.3* 10.2*  HCT 30.8* 37.1* 31.8* 33.9*  MCV 79.0* 80.5 81.1 79.0*  PLT 304 342 291 380    Medications:     (feeding supplement) PROSource Plus  30 mL Oral BID BM   aspirin EC  81 mg Oral Daily   benzonatate  100 mg Oral TID   Chlorhexidine Gluconate Cloth  6 each Topical Q0600   feeding supplement (NEPRO CARB STEADY)  237 mL Oral BID BM   [START ON 07/05/2022] furosemide  40 mg Oral Daily   guaiFENesin  1,200 mg Oral BID   heparin  5,000 Units Subcutaneous Q8H   insulin aspart  0-20 Units Subcutaneous TID WC   insulin glargine-yfgn  25 Units Subcutaneous QHS   metroNIDAZOLE  500 mg Oral Q12H   multivitamin  1 tablet Oral QHS   nutrition supplement (JUVEN)  1 packet Oral BID BM   pantoprazole  40 mg Oral Daily   senna-docusate  1 tablet Oral BID   sodium chloride flush  3 mL Intravenous Q12H  sucroferric oxyhydroxide  500 mg Oral TID WC      Gean Quint, MD [facility] 07/04/2022, 11:01 AM

## 2022-07-04 NOTE — Anesthesia Procedure Notes (Signed)
Procedure Name: LMA Insertion Date/Time: 07/04/2022 2:28 PM  Performed by: Mariea Clonts, CRNAPre-anesthesia Checklist: Patient identified, Emergency Drugs available, Suction available and Patient being monitored Patient Re-evaluated:Patient Re-evaluated prior to induction Oxygen Delivery Method: Circle System Utilized Preoxygenation: Pre-oxygenation with 100% oxygen Induction Type: IV induction Ventilation: Mask ventilation without difficulty LMA: LMA inserted LMA Size: 5.0 Number of attempts: 1 Airway Equipment and Method: Bite block Placement Confirmation: positive ETCO2 Tube secured with: Tape Dental Injury: Teeth and Oropharynx as per pre-operative assessment

## 2022-07-04 NOTE — Interval H&P Note (Signed)
History and Physical Interval Note:  07/04/2022 6:56 AM  [name] Cruel Sr.  has presented today for surgery, with the diagnosis of abscess right below knee amputation.  The various methods of treatment have been discussed with the patient and family. After consideration of risks, benefits and other options for treatment, the patient has consented to  Procedure(s): REVISION AMPUTATION BELOW KNEE (Right) as a surgical intervention.  The patient's history has been reviewed, patient examined, no change in status, stable for surgery.  I have reviewed the patient's chart and labs.  Questions were answered to the patient's satisfaction.     [name]

## 2022-07-04 NOTE — Anesthesia Procedure Notes (Signed)
Anesthesia Regional Block: Popliteal block   Pre-Anesthetic Checklist: , timeout performed,  Correct Patient, Correct Site, Correct Laterality,  Correct Procedure, Correct Position, site marked,  Risks and benefits discussed,  Surgical consent,  Pre-op evaluation,  At surgeon's request and post-op pain management  Laterality: Right  Prep: Dura Prep       Needles:  Injection technique: Single-shot  Needle Type: Echogenic Stimulator Needle     Needle Length: 10cm  Needle Gauge: 20     Additional Needles:   Procedures:,,,, ultrasound used (permanent image in chart),,    Narrative:  Start time: 07/04/2022 2:18 PM End time: 07/04/2022 2:20 PM Injection made incrementally with aspirations every 5 mL.  Performed by: Personally  Anesthesiologist: Darral Dash, DO  Additional Notes: Patient identified. Risks/Benefits/Options discussed with patient including but not limited to bleeding, infection, nerve damage, failed block, incomplete pain control. Patient expressed understanding and wished to proceed. All questions were answered. Sterile technique was used throughout the entire procedure. Please see nursing notes for vital signs. Aspirated in 5cc intervals with injection for negative confirmation. Patient was given instructions on fall risk and not to get out of bed. All questions and concerns addressed with instructions to call with any issues or inadequate analgesia.

## 2022-07-05 DIAGNOSIS — L03115 Cellulitis of right lower limb: Secondary | ICD-10-CM | POA: Diagnosis not present

## 2022-07-05 DIAGNOSIS — I5022 Chronic systolic (congestive) heart failure: Secondary | ICD-10-CM | POA: Diagnosis not present

## 2022-07-05 DIAGNOSIS — T8781 Dehiscence of amputation stump: Secondary | ICD-10-CM | POA: Diagnosis not present

## 2022-07-05 DIAGNOSIS — L02415 Cutaneous abscess of right lower limb: Secondary | ICD-10-CM | POA: Diagnosis not present

## 2022-07-05 DIAGNOSIS — E1100 Type 2 diabetes mellitus with hyperosmolarity without nonketotic hyperglycemic-hyperosmolar coma (NKHHC): Secondary | ICD-10-CM

## 2022-07-05 LAB — OSMOLALITY: Osmolality: 289 mOsm/kg (ref 275–295)

## 2022-07-05 LAB — BASIC METABOLIC PANEL
Anion gap: 13 (ref 5–15)
Anion gap: 15 (ref 5–15)
Anion gap: 12 (ref 5–15)
Anion gap: 13 (ref 5–15)
BUN: 52 mg/dL — ABNORMAL HIGH (ref 6–20)
BUN: 25 mg/dL — ABNORMAL HIGH (ref 6–20)
BUN: 31 mg/dL — ABNORMAL HIGH (ref 6–20)
BUN: 32 mg/dL — ABNORMAL HIGH (ref 6–20)
CO2: 25 mmol/L (ref 22–32)
CO2: 26 mmol/L (ref 22–32)
CO2: 27 mmol/L (ref 22–32)
CO2: 27 mmol/L (ref 22–32)
Calcium: 9.2 mg/dL (ref 8.9–10.3)
Calcium: 9.2 mg/dL (ref 8.9–10.3)
Calcium: 9.3 mg/dL (ref 8.9–10.3)
Calcium: 9.2 mg/dL (ref 8.9–10.3)
Chloride: 90 mmol/L — ABNORMAL LOW (ref 98–111)
Chloride: 91 mmol/L — ABNORMAL LOW (ref 98–111)
Chloride: 93 mmol/L — ABNORMAL LOW (ref 98–111)
Chloride: 92 mmol/L — ABNORMAL LOW (ref 98–111)
Creatinine, Ser: 8.44 mg/dL — ABNORMAL HIGH (ref 0.61–1.24)
Creatinine, Ser: 4.69 mg/dL — ABNORMAL HIGH (ref 0.61–1.24)
Creatinine, Ser: 5.07 mg/dL — ABNORMAL HIGH (ref 0.61–1.24)
Creatinine, Ser: 5.61 mg/dL — ABNORMAL HIGH (ref 0.61–1.24)
GFR, Estimated: 7 mL/min — ABNORMAL LOW (ref >60)
GFR, Estimated: 14 mL/min — ABNORMAL LOW (ref >60)
GFR, Estimated: 12 mL/min — ABNORMAL LOW (ref >60)
GFR, Estimated: 11 mL/min — ABNORMAL LOW (ref >60)
Glucose, Bld: 648 mg/dL (ref 70–99)
Glucose, Bld: 152 mg/dL — ABNORMAL HIGH (ref 70–99)
Glucose, Bld: 150 mg/dL — ABNORMAL HIGH (ref 70–99)
Glucose, Bld: 239 mg/dL — ABNORMAL HIGH (ref 70–99)
Potassium: 3.7 mmol/L (ref 3.5–5.1)
Potassium: 3.3 mmol/L — ABNORMAL LOW (ref 3.5–5.1)
Potassium: 3 mmol/L — ABNORMAL LOW (ref 3.5–5.1)
Potassium: 3.1 mmol/L — ABNORMAL LOW (ref 3.5–5.1)
Sodium: 128 mmol/L — ABNORMAL LOW (ref 135–145)
Sodium: 132 mmol/L — ABNORMAL LOW (ref 135–145)
Sodium: 132 mmol/L — ABNORMAL LOW (ref 135–145)
Sodium: 132 mmol/L — ABNORMAL LOW (ref 135–145)

## 2022-07-05 LAB — TYPE AND SCREEN
ABO/RH(D): A POS
Antibody Screen: NEGATIVE
Unit division: 0

## 2022-07-05 LAB — GLUCOSE, CAPILLARY
Glucose-Capillary: >600 mg/dL (ref 70–99)
Glucose-Capillary: 345 mg/dL — ABNORMAL HIGH (ref 70–99)
Glucose-Capillary: 229 mg/dL — ABNORMAL HIGH (ref 70–99)
Glucose-Capillary: 134 mg/dL — ABNORMAL HIGH (ref 70–99)
Glucose-Capillary: 133 mg/dL — ABNORMAL HIGH (ref 70–99)
Glucose-Capillary: 193 mg/dL — ABNORMAL HIGH (ref 70–99)

## 2022-07-05 LAB — RENAL FUNCTION PANEL
Albumin: 2.3 g/dL — ABNORMAL LOW (ref 3.5–5.0)
Anion gap: 16 — ABNORMAL HIGH (ref 5–15)
BUN: 47 mg/dL — ABNORMAL HIGH (ref 6–20)
CO2: 23 mmol/L (ref 22–32)
Calcium: 9.2 mg/dL (ref 8.9–10.3)
Chloride: 89 mmol/L — ABNORMAL LOW (ref 98–111)
Creatinine, Ser: 7.97 mg/dL — ABNORMAL HIGH (ref 0.61–1.24)
GFR, Estimated: 7 mL/min — ABNORMAL LOW (ref >60)
Glucose, Bld: 666 mg/dL (ref 70–99)
Phosphorus: 3.1 mg/dL (ref 2.5–4.6)
Potassium: 3.9 mmol/L (ref 3.5–5.1)
Sodium: 128 mmol/L — ABNORMAL LOW (ref 135–145)

## 2022-07-05 LAB — CBC WITH DIFFERENTIAL/PLATELET
Abs Immature Granulocytes: 0.16 10*3/uL — ABNORMAL HIGH (ref 0.00–0.07)
Basophils Absolute: 0 10*3/uL (ref 0.0–0.1)
Basophils Relative: 0 %
Eosinophils Absolute: 0 10*3/uL (ref 0.0–0.5)
Eosinophils Relative: 0 %
HCT: 34.9 % — ABNORMAL LOW (ref 39.0–52.0)
Hemoglobin: 10.6 g/dL — ABNORMAL LOW (ref 13.0–17.0)
Immature Granulocytes: 2 %
Lymphocytes Relative: 6 %
Lymphs Abs: 0.6 10*3/uL — ABNORMAL LOW (ref 0.7–4.0)
MCH: 24.1 pg — ABNORMAL LOW (ref 26.0–34.0)
MCHC: 30.4 g/dL (ref 30.0–36.0)
MCV: 79.3 fL — ABNORMAL LOW (ref 80.0–100.0)
Monocytes Absolute: 0.6 10*3/uL (ref 0.1–1.0)
Monocytes Relative: 5 %
Neutro Abs: 9.3 10*3/uL — ABNORMAL HIGH (ref 1.7–7.7)
Neutrophils Relative %: 87 %
Platelets: 421 10*3/uL — ABNORMAL HIGH (ref 150–400)
RBC: 4.4 MIL/uL (ref 4.22–5.81)
RDW: 21.5 % — ABNORMAL HIGH (ref 11.5–15.5)
WBC: 10.7 10*3/uL — ABNORMAL HIGH (ref 4.0–10.5)
nRBC: 0.8 % — ABNORMAL HIGH (ref 0.0–0.2)

## 2022-07-05 LAB — BPAM RBC
Blood Product Expiration Date: 202309032359
ISSUE DATE / TIME: 202308180812
Unit Type and Rh: 6200

## 2022-07-05 LAB — MAGNESIUM: Magnesium: 2.2 mg/dL (ref 1.7–2.4)

## 2022-07-05 MED ORDER — METRONIDAZOLE 500 MG PO TABS
500.0000 mg | ORAL_TABLET | Freq: Two times a day (BID) | ORAL | Status: DC
  Filled 2022-07-05: qty 1

## 2022-07-05 MED ORDER — INSULIN ASPART 100 UNIT/ML IJ SOLN
0.0000 [IU] | INTRAMUSCULAR | Status: DC
  Administered 2022-07-05: 3 [IU] via SUBCUTANEOUS
  Administered 2022-07-05: 2 [IU] via SUBCUTANEOUS
  Administered 2022-07-06: 5 [IU] via SUBCUTANEOUS
  Administered 2022-07-06: 8 [IU] via SUBCUTANEOUS

## 2022-07-05 MED ORDER — INSULIN ASPART 100 UNIT/ML IJ SOLN
25.0000 [IU] | Freq: Once | INTRAMUSCULAR | Status: AC
  Administered 2022-07-05: 25 [IU] via SUBCUTANEOUS

## 2022-07-05 MED ORDER — INSULIN GLARGINE-YFGN 100 UNIT/ML ~~LOC~~ SOLN
25.0000 [IU] | Freq: Every day | SUBCUTANEOUS | Status: DC
  Administered 2022-07-05: 25 [IU] via SUBCUTANEOUS
  Filled 2022-07-05 (×3): qty 0.25

## 2022-07-05 MED ORDER — INSULIN ASPART 100 UNIT/ML IJ SOLN
20.0000 [IU] | Freq: Once | INTRAMUSCULAR | Status: AC
  Administered 2022-07-05: 20 [IU] via SUBCUTANEOUS

## 2022-07-05 MED ORDER — METRONIDAZOLE 500 MG PO TABS
500.0000 mg | ORAL_TABLET | Freq: Two times a day (BID) | ORAL | Status: DC
  Administered 2022-07-05 – 2022-07-08 (×7): 500 mg via ORAL
  Filled 2022-07-05 (×7): qty 1

## 2022-07-05 MED ORDER — SODIUM CHLORIDE 0.9 % IV SOLN
2.0000 g | INTRAVENOUS | Status: DC
  Administered 2022-07-05 – 2022-07-08 (×4): 2 g via INTRAVENOUS
  Filled 2022-07-05 (×5): qty 20

## 2022-07-05 MED ORDER — INSULIN REGULAR(HUMAN) IN NACL 100-0.9 UT/100ML-% IV SOLN: INTRAVENOUS | Status: DC

## 2022-07-05 MED ORDER — INSULIN ASPART 100 UNIT/ML IV SOLN
10.0000 [IU] | Freq: Once | INTRAVENOUS | Status: AC
  Administered 2022-07-05: 10 [IU] via INTRAVENOUS

## 2022-07-05 MED ORDER — DEXTROSE 50 % IV SOLN: 0.0000 mL | INTRAVENOUS | Status: DC | PRN

## 2022-07-05 NOTE — Evaluation (Signed)
Occupational Therapy Evaluation Patient Details Name: [name] [name] Sr. MRN: [id number] DOB: [birth year] Today's Date: 07/05/2022   History of Present Illness 61 y/o presents 07/01/22 for evaluation from the nursing facility of redness and drainage from his amputation site concerning for possible infection.  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  PMH- hypertension, cardiomyopathy, CHF, diabetes, renal failure dialysis, R BKA   Clinical Impression   Pt admitted for concerns listed above. PTA pt reported that he has been at SNF working on stand/hop transfers, as well as improving independence of ADL's. At this time, pt presents near his recent baseline from SNF. He presents with pain, mild weakness, and balance deficits, requiring min-mod A +2 for ADL's and standing. He requires frequent cuing for RW safety and hand placement. Recommending return to SNF to continue working towards maximizing  independence and safety. OT will follow acutely.       Recommendations for follow up therapy are one component of a multi-disciplinary discharge planning process, led by the attending physician.  Recommendations may be updated based on patient status, additional functional criteria and insurance authorization.   Follow Up Recommendations  Skilled nursing-short term rehab (<3 hours/day)    Assistance Recommended at Discharge Frequent or constant Supervision/Assistance  Patient can return home with the following Two people to help with walking and/or transfers;Two people to help with bathing/dressing/bathroom;Assistance with cooking/housework;Help with stairs or ramp for entrance    Functional Status Assessment  Patient has had a recent decline in their functional status and demonstrates the ability to make significant improvements in function in a reasonable and predictable amount of time.  Equipment Recommendations  None recommended by OT     Recommendations for Other Services       Precautions / Restrictions Precautions Precautions: Fall Restrictions Weight Bearing Restrictions: No      Mobility Bed Mobility Overal bed mobility: Needs Assistance Bed Mobility: Rolling, Sidelying to Sit, Sit to Supine Rolling: Modified independent (Device/Increase time) Sidelying to sit: Min guard, HOB elevated   Sit to supine: Supervision   General bed mobility comments: pt with dizziness upon sitting EOB "spinning" with closeguarding for safety    Transfers Overall transfer level: Needs assistance Equipment used: Rolling walker (2 wheels) Transfers: Sit to/from Stand Sit to Stand: Min assist, +2 physical assistance, From elevated surface           General transfer comment: stood x 2 from elevated EOB; incr time to come to full standing even once hands on RW      Balance Overall balance assessment: Needs assistance Sitting-balance support: No upper extremity supported, Feet supported Sitting balance-Leahy Scale: Fair     Standing balance support: Bilateral upper extremity supported, Reliant on assistive device for balance Standing balance-Leahy Scale: Poor                             ADL either performed or assessed with clinical judgement   ADL Overall ADL's : Needs assistance/impaired Eating/Feeding: Modified independent;Sitting   Grooming: Modified independent;Sitting   Upper Body Bathing: Supervision/ safety;Sitting   Lower Body Bathing: Moderate assistance;+2 for physical assistance;+2 for safety/equipment;Sitting/lateral leans;Sit to/from stand   Upper Body Dressing : Supervision/safety;Sitting   Lower Body Dressing: Moderate assistance;+2 for physical assistance;+2 for safety/equipment;Sitting/lateral leans;Sit to/from stand   Toilet Transfer: Moderate assistance;+2 for physical assistance;+2 for safety/equipment;Stand-pivot   Toileting- Clothing Manipulation and Hygiene: Moderate  assistance;+2  for physical assistance;+2 for safety/equipment;Sit to/from stand;Sitting/lateral lean         General ADL Comments: Pt limited by balance deficits, requiring increased assist in standing     Vision Baseline Vision/History: 1 Wears glasses Ability to See in Adequate Light: 0 Adequate Patient Visual Report: No change from baseline Vision Assessment?: No apparent visual deficits     Perception     Praxis      Pertinent Vitals/Pain Pain Assessment Pain Assessment: 0-10 Pain Score: 8  Pain Location: Rt BKA Pain Descriptors / Indicators: Discomfort, Sore Pain Intervention(s): Limited activity within patient's tolerance, Monitored during session, Repositioned     Hand Dominance Right   Extremity/Trunk Assessment Upper Extremity Assessment Upper Extremity Assessment: Overall WFL for tasks assessed   Lower Extremity Assessment Lower Extremity Assessment: Defer to PT evaluation   Cervical / Trunk Assessment Cervical / Trunk Assessment: Normal   Communication Communication Communication: Expressive difficulties (difficult to understand at times)   Cognition Arousal/Alertness: Awake/alert Behavior During Therapy: Flat affect Overall Cognitive Status: No family/caregiver present to determine baseline cognitive functioning                                 General Comments: slow processing and slow to verbally respond     General Comments  No nystagmus noted with pt reporting spinning >60 seconds; Spinning stopped as soon as he laid down    Exercises     Shoulder Instructions      Home Living Family/patient expects to be discharged to:: Skilled nursing facility                                        Prior Functioning/Environment Prior Level of Function : Needs assist             Mobility Comments: reports can perform lateral scoot to wheelchair independently; walking with RW and assist in hallway ADLs Comments:  needing some assist for ADLs        OT Problem List: Decreased strength;Decreased activity tolerance;Impaired balance (sitting and/or standing);Decreased safety awareness;Impaired sensation;Pain      OT Treatment/Interventions: Self-care/ADL training;Therapeutic exercise;Energy conservation;DME and/or AE instruction;Therapeutic activities;Patient/family education;Balance training    OT Goals(Current goals can be found in the care plan section) Acute Rehab OT Goals Patient Stated Goal: To get his independence back OT Goal Formulation: With patient Time For Goal Achievement: 07/19/22 Potential to Achieve Goals: Good ADL Goals Pt Will Perform Grooming: with supervision;standing Pt Will Perform Lower Body Bathing: with modified independence;with adaptive equipment;sitting/lateral leans Pt Will Perform Lower Body Dressing: with modified independence;with adaptive equipment;sitting/lateral leans Pt Will Transfer to Toilet: with modified independence;stand pivot transfer Pt Will Perform Toileting - Clothing Manipulation and hygiene: with modified independence;sitting/lateral leans  OT Frequency: Min 2X/week    Co-evaluation PT/OT/SLP Co-Evaluation/Treatment: Yes Reason for Co-Treatment: For patient/therapist safety;To address functional/ADL transfers;Other (comment) (after HD)   OT goals addressed during session: ADL's and self-care      AM-PAC OT "6 Clicks" Daily Activity     Outcome Measure Help from another person eating meals?: None Help from another person taking care of personal grooming?: None Help from another person toileting, which includes using toliet, bedpan, or urinal?: A Lot Help from another person bathing (including washing, rinsing, drying)?: A Lot Help from another person to put on and taking off regular upper  body clothing?: A Little Help from another person to put on and taking off regular lower body clothing?: A Lot 6 Click Score: 17   End of Session Equipment  Utilized During Treatment: Gait belt;Rolling walker (2 wheels) Nurse Communication: Mobility status  Activity Tolerance: Patient tolerated treatment well Patient left: in bed;with call bell/phone within reach  OT Visit Diagnosis: Unsteadiness on feet (R26.81);Other abnormalities of gait and mobility (R26.89);Muscle weakness (generalized) (M62.81)                Time: 8676-1950 OT Time Calculation (min): 18 min Charges:  OT General Charges $OT Visit: 1 Visit OT Evaluation $OT Eval Moderate Complexity: 1 Mod  Kerby Borner H., OTR/L Acute Rehabilitation  Fadia Marlar Elane Yolanda Bonine 07/05/2022, 5:53 PM

## 2022-07-05 NOTE — Progress Notes (Signed)
Patient ID: [name] Cruel Sr., male   DOB: 07/02/1961, 61 y.o.   MRN: [id number] Patient is postoperative day 1 revision right below-knee amputation.  Tissue margins were clear after revision amputation.  The wound VAC is functioning well no drainage.  Anticipate patient will need discharge to skilled nursing.  Plan for discharge with the [employer] plus portable wound VAC pump.

## 2022-07-05 NOTE — Evaluation (Signed)
Physical Therapy Evaluation Patient Details Name: [name] GUERETTE Sr. MRN: [id number] DOB: [birth year] Today's Date: 07/05/2022  History of Present Illness  61 y/o presents 07/01/22 for evaluation from the nursing facility of redness and drainage from his amputation site concerning for possible infection.  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  PMH- hypertension, cardiomyopathy, CHF, diabetes, renal failure dialysis, R BKA  Clinical Impression   Pt admitted secondary to problem above with deficits below. PTA patient was at [facility] receiving therapies. He was working on walking with RW in hallway. Pt currently requires +2 min assist to stand from an elevated bed and was unsafe to progress to ambulation due to dizziness.  Anticipate patient will benefit from PT to address problems listed below.Will continue to follow acutely to maximize functional mobility independence and safety.          Recommendations for follow up therapy are one component of a multi-disciplinary discharge planning process, led by the attending physician.  Recommendations may be updated based on patient status, additional functional criteria and insurance authorization.  Follow Up Recommendations Skilled nursing-short term rehab (<3 hours/day) Can patient physically be transported by private vehicle: No    Assistance Recommended at Discharge Intermittent Supervision/Assistance  Patient can return home with the following  Two people to help with walking and/or transfers;Assistance with cooking/housework;Direct supervision/assist for medications management;Direct supervision/assist for financial management;Assist for transportation;Help with stairs or ramp for entrance    Equipment Recommendations None recommended by PT  Recommendations for Other Services       Functional Status Assessment Patient has had a recent decline in their functional status and demonstrates the ability  to make significant improvements in function in a reasonable and predictable amount of time.     Precautions / Restrictions Precautions Precautions: Fall      Mobility  Bed Mobility Overal bed mobility: Needs Assistance Bed Mobility: Rolling, Sidelying to Sit, Sit to Supine Rolling: Modified independent (Device/Increase time) Sidelying to sit: Min guard, HOB elevated   Sit to supine: Supervision   General bed mobility comments: pt with dizziness upon sitting EOB "spinning" with closeguarding for safety    Transfers Overall transfer level: Needs assistance Equipment used: Rolling walker (2 wheels) Transfers: Sit to/from Stand Sit to Stand: Min assist, +2 physical assistance, From elevated surface           General transfer comment: stood x 2 from elevated EOB; incr time to come to full standing even once hands on RW    Ambulation/Gait               General Gait Details: deferred due to dizziness/spinning  Stairs            Wheelchair Mobility    Modified Rankin (Stroke Patients Only)       Balance Overall balance assessment: Needs assistance Sitting-balance support: No upper extremity supported, Feet supported Sitting balance-Leahy Scale: Fair     Standing balance support: Bilateral upper extremity supported, Reliant on assistive device for balance Standing balance-Leahy Scale: Poor                               Pertinent Vitals/Pain Pain Assessment Pain Assessment: 0-10 Pain Score: 8  Pain Location: Rt BKA Pain Descriptors / Indicators: Discomfort, Sore Pain Intervention(s): Limited activity within patient's tolerance, Monitored during session    Home Living Family/patient expects to be discharged  to:: Skilled nursing facility                        Prior Function Prior Level of Function : Needs assist             Mobility Comments: reports can perform lateral scoot to wheelchair independently; walking with RW  and assist in hallway       Hand Dominance   Dominant Hand: Right    Extremity/Trunk Assessment   Upper Extremity Assessment Upper Extremity Assessment: Defer to OT evaluation    Lower Extremity Assessment Lower Extremity Assessment: Generalized weakness (good ROM Rt knee, including 0 degrees extension)    Cervical / Trunk Assessment Cervical / Trunk Assessment: Normal  Communication   Communication: Expressive difficulties (difficult to understand at times)  Cognition Arousal/Alertness: Awake/alert Behavior During Therapy: Flat affect Overall Cognitive Status: No family/caregiver present to determine baseline cognitive functioning                                 General Comments: slow processing and slow to verbally respond        General Comments General comments (skin integrity, edema, etc.): No nystagmus noted with pt reporting spinning >60 seconds; Spinning stopped as soon as he laid down    Exercises     Assessment/Plan    PT Assessment Patient needs continued PT services  PT Problem List Decreased strength;Decreased activity tolerance;Decreased balance;Decreased mobility;Decreased cognition;Decreased knowledge of use of DME;Decreased safety awareness;Obesity;Pain;Decreased skin integrity       PT Treatment Interventions DME instruction;Gait training;Functional mobility training;Therapeutic activities;Therapeutic exercise;Balance training;Cognitive remediation;Patient/family education    PT Goals (Current goals can be found in the Care Plan section)  Acute Rehab PT Goals Patient Stated Goal: get back to walking PT Goal Formulation: With patient Time For Goal Achievement: 07/19/22 Potential to Achieve Goals: Good    Frequency Min 3X/week     Co-evaluation PT/OT/SLP Co-Evaluation/Treatment: Yes Reason for Co-Treatment: For patient/therapist safety;Other (comment) (pt tolerance after HD)           AM-PAC PT "6 Clicks" Mobility   Outcome Measure Help needed turning from your back to your side while in a flat bed without using bedrails?: None Help needed moving from lying on your back to sitting on the side of a flat bed without using bedrails?: A Little Help needed moving to and from a bed to a chair (including a wheelchair)?: Total Help needed standing up from a chair using your arms (e.g., wheelchair or bedside chair)?: Total Help needed to walk in hospital room?: Total Help needed climbing 3-5 steps with a railing? : Total 6 Click Score: 11    End of Session Equipment Utilized During Treatment: Gait belt Activity Tolerance: Treatment limited secondary to medical complications (Comment) (dizziness) Patient left: in bed;with call bell/phone within reach;with bed alarm set   PT Visit Diagnosis: Unsteadiness on feet (R26.81);Other abnormalities of gait and mobility (R26.89);Muscle weakness (generalized) (M62.81)    Time: 6767-2094 PT Time Calculation (min) (ACUTE ONLY): 18 min   Charges:   PT Evaluation $PT Eval Low Complexity: [place], PT Acute Rehabilitation Services  Office [phone]   Rexanne Mano 07/05/2022, 5:03 PM

## 2022-07-05 NOTE — Progress Notes (Signed)
PROGRESS NOTE    [name] Cruel Sr.  NGE:[id number] DOB: June 07, 1961 DOA: 07/01/2022 PCP: Pcp, No    Chief Complaint  Patient presents with   Wound Infection    Brief Narrative:  [name] Cruel Sr. is a 61 y.o. male with PMH significant for ESRD-HD-TTS, DM2, HTN, HLD, CAD/MI, recent right BKA Patient was brought to the ED on 8/15 from [name] for evaluation of redness, drainage from his amputation site concerning for possible infection.   Patient recently underwent right BKA on 7/7 by Dr. Sharol Given for right foot osteomyelitis.  While at the rehab, he was noted to have progressive worsening of redness, drainage and pain at the site of amputation and hence sent to the ED.   In the ED, he had an initial temperature of 100.9, heart rate 102, blood pressure 146/81, 90% O2 sat on room air. Labs showed WBC of 13.3, hemoglobin 9.3, BNP more than 1600, lactic acid 1.3, glucose low at 56. CXR with cardiomegaly and pulmonary congestion.    In the ED, patient was started on vancomycin/Rocephin/ flagyl.   Admitted to hospitalist service.   Assessment & Plan:   Principal Problem:   Cellulitis of right leg Active Problems:   Abscess of right lower leg   Dehiscence of amputation stump of right lower extremity (HCC)   DM2 (diabetes mellitus, type 2) (HCC)   ESRD (end stage renal disease) (HCC)   HTN (hypertension)   Chronic systolic CHF (congestive heart failure) (HCC)   GERD (gastroesophageal reflux disease)   Pressure injury of skin   Diarrhea   Hyperosmolar hyperglycemic state (HHS) ([place])  #1 status post right BKA with amputation stump infection/wound dehiscence/Abscess right BKA -Patient status post right BKA 05/24/19/2023, sent from SNF with erythema, drainage, fever concerning for abscess versus cellulitis. -Patient seen in consultation by orthopedics, Dr. Sharol Given who discussed with patient and patient would not consider an amputation above the knee at this time and patient  underwent revision of right transtibial amputation in the OR 07/04/2022 with abscess noted. Ortho form wound VAC placed. -Continue empiric IV vancomycin, IV Rocephin, Flagyl. -ID consulted and are following.   -Orthopedics following. -Follow.  2.  End-stage renal disease HD-TTS -Patient noted to have missed dialysis on presentation in the ED. -Nephrology following and patient receiving hemodialysis. -Per nephrology.  3.  Diarrhea -Patient multiple loose stools noted. -C. difficile PCR negative, GI pathogen panel negative. -Discontinued enteric precautions. -Imodium as needed.  4.  Poorly controlled type 2 diabetes mellitus/hyperglycemia/hyperosmolar hyperglycemia -Hemoglobin A1c 8.2 on 05/24/2022. -Patient noted earlier in the hospitalization to have low blood glucose levels of 51. -Lantus dose decreased to 25 units daily, SSI. -Patient noted to have blood glucose level of > 600 this a.m., stat BMET of 648.   -Hyperglycemic hyperosmolar state likely secondary to acute infection in the setting of diet noncompliance.   -Patient received 25 units of NovoLog around 8 AM.  We will give another 20 units of NovoLog and check CBGs every 1-2 hours.  Check BMET every 4 hours. -Place on insulin drip. -No IV fluids due to ESRD on HD. -Diabetes coordinator following.  5.  Chronic systolic CHF/hypertension -Stable. -Continue Norvasc daily.  6.  History of CAD/MI -Aspirin.  7.  GERD -PPI.  8.  Hypomagnesemia -Likely secondary to GI losses. -Magnesium currently at 2.2. -Repeat labs in the AM.  9.  Stage III left medial pressure injury buttocks, POA Pressure Injury 07/02/22 Buttocks Left;Medial Stage 3 -  Full thickness  tissue loss. Subcutaneous fat may be visible but bone, tendon or muscle are NOT exposed. (Active)  07/02/22 0058  Location: Buttocks  Location Orientation: Left;Medial  Staging: Stage 3 -  Full thickness tissue loss. Subcutaneous fat may be visible but bone, tendon or  muscle are NOT exposed.  Wound Description (Comments):   Present on Admission: Yes         DVT prophylaxis: Heparin Code Status: Full Family Communication: Updated patient.  No family at bedside. Disposition: Back to skilled nursing facility when medically stable and cleared by orthopedics and ID.  Status is: Inpatient Remains inpatient appropriate because: Severity of illness   Consultants:  Orthopedics: Dr. Sharol Given 07/03/2022 Nephrology: Dr. Candiss Norse 07/01/2022 ID: Dr. Gale Journey 07/04/2022  Procedures: Chest x-ray 07/01/2022 Plain films of the right knee 07/01/2022 Transfusion 1 unit packed red blood cells 07/04/2022 preop  Antimicrobials:  IV Rocephin 07/01/2022>>>> 07/08/2022 IV vancomycin 07/01/2022>>>>> Oral Flagyl 07/01/2022>>>> 07/08/2022   Subjective: Patient in hemodialysis.  Denies any chest pain.  No shortness of breath.  No abdominal pain.  Stated was just seen by orthopedics, Dr. Sharol Given.  Patient noted to have elevated CBG > 600 this morning.  Stat BMET obtained with a glucose level of 648.   Objective: Vitals:   07/05/22 0900 07/05/22 0902 07/05/22 0930 07/05/22 1000  BP:  (!) 120/53 136/63 120/70  Pulse: 71 71 73 71  Resp: 19 18 15 18   Temp:      TempSrc:      SpO2: 100% 100% 99% 95%  Weight:      Height:        Intake/Output Summary (Last 24 hours) at 07/05/2022 1023 Last data filed at 07/05/2022 0800 Gross per 24 hour  Intake 2070 ml  Output 200 ml  Net 1870 ml   Filed Weights   07/04/22 0452 07/04/22 1346 07/05/22 0845  Weight: 99.8 kg 99.8 kg 105.2 kg    Examination:  General exam: NAD Respiratory system: CTA B anterior lung fields.  No wheezes, no crackles, no rhonchi.  Fair air movement.  Speaking in full sentences.   Cardiovascular system: Regular rate rhythm no murmurs rubs or gallops.  No JVD.  No lower extremity edema. Gastrointestinal system: Abdomen is nondistended, soft and nontender. No organomegaly or masses felt. Normal bowel sounds  heard. Central nervous system: Alert and oriented. No focal neurological deficits. Extremities: Right BKA with Ortho form wound VAC.  Skin: No rashes, lesions or ulcers Psychiatry: Judgement and insight appear normal. Mood & affect appropriate.     Data Reviewed: I have personally reviewed following labs and imaging studies  CBC: Recent Labs  Lab 07/01/22 1344 07/02/22 0100 07/03/22 0322 07/04/22 0836 07/04/22 1628 07/05/22 0304  WBC 13.3* 14.0* 10.0 9.3  --  10.7*  NEUTROABS 9.6*  --  6.3 5.3  --  9.3*  HGB 9.3* 11.1* 9.3* 10.2* 11.1* 10.6*  HCT 30.8* 37.1* 31.8* 33.9* 35.5* 34.9*  MCV 79.0* 80.5 81.1 79.0*  --  79.3*  PLT 304 342 291 380  --  421*    Basic Metabolic Panel: Recent Labs  Lab 07/02/22 0100 07/03/22 0322 07/04/22 0836 07/05/22 0304 07/05/22 0856  NA 135  135 135 132* 128* 128*  K 3.6  3.6 3.2* 3.5 3.9 3.7  CL 90*  90* 98 92* 89* 90*  CO2 29  29 26 27 23 25   GLUCOSE 82  81 134* 227* 666* 648*  BUN 42*  40* 50* 28* 47* 52*  CREATININE 7.34*  7.26*  8.54* 6.46* 7.97* 8.44*  CALCIUM 9.4  9.4 8.6* 9.1 9.2 9.2  MG  --   --  1.6* 2.2  --   PHOS 3.4  --   --  3.1  --     GFR: Estimated Creatinine Clearance: 12 mL/min (A) (by C-G formula based on SCr of 8.44 mg/dL (H)).  Liver Function Tests: Recent Labs  Lab 07/01/22 1344 07/02/22 0100 07/05/22 0304  AST 19  --   --   ALT 17  --   --   ALKPHOS 174*  --   --   BILITOT 2.1*  --   --   PROT 8.3*  --   --   ALBUMIN 2.6* 3.1* 2.3*    CBG: Recent Labs  Lab 07/04/22 1433 07/04/22 1512 07/04/22 1608 07/04/22 2035 07/05/22 0711  GLUCAP 163* 167* 178* 312* >600*     Recent Results (from the past 240 hour(s))  Urine Culture     Status: None   Collection Time: 07/01/22 11:44 AM   Specimen: In/Out Cath Urine  Result Value Ref Range Status   Specimen Description   Final    IN/OUT CATH URINE Performed at [facility] [address] [place], [place] [address]     Special Requests   Final    NONE Performed at [facility], [place] [address], [place], [place] [address]    Culture   Final    NO GROWTH Performed at [facility], [place] [address] [place], [place] [address]    Report Status 07/04/2022 FINAL  Final  SARS Coronavirus 2 by RT PCR (hospital order, performed in [facility] hospital lab) *cepheid single result test* Anterior Nasal Swab     Status: None   Collection Time: 07/01/22 11:47 AM   Specimen: Anterior Nasal Swab  Result Value Ref Range Status   SARS Coronavirus 2 by RT PCR NEGATIVE NEGATIVE Final    Comment: (NOTE) SARS-CoV-2 target nucleic acids are NOT DETECTED.  The SARS-CoV-2 RNA is generally detectable in upper and lower respiratory specimens during the acute phase of infection. The lowest concentration of SARS-CoV-2 viral copies this assay can detect is 250 copies / mL. A negative result does not preclude SARS-CoV-2 infection and should not be used as the sole basis for treatment or other patient management decisions.  A negative result may occur with improper specimen collection / handling, submission of specimen other than nasopharyngeal swab, presence of viral mutation(s) within the areas targeted by this assay, and inadequate number of viral copies (<250 copies / mL). A negative result must be combined with clinical observations, patient history, and epidemiological information.  Fact Sheet for Patients:   https://www.patel.info/  Fact Sheet for Healthcare Providers: https://hall.com/  This test is not yet approved or  cleared by the [place] FDA and has been authorized for detection and/or diagnosis of SARS-CoV-2 by FDA under an Emergency Use Authorization (EUA).  This EUA will remain in effect (meaning this test can be used) for the duration of the COVID-19 declaration under Section 564(b)(1) of the Act, 21 U.S.C. section 360bbb-3(b)(1), unless  the authorization is terminated or revoked sooner.  Performed at [facility], [place] [address], [place], [place] [address]   Blood Culture (routine x 2)     Status: None (Preliminary result)   Collection Time: 07/01/22  1:44 PM   Specimen: BLOOD  Result Value Ref Range Status   Specimen Description   Final  BLOOD BLOOD RIGHT FOREARM Performed at [facility] [address] [place], [place] [address]    Special Requests   Final    BOTTLES DRAWN AEROBIC AND ANAEROBIC Blood Culture results may not be optimal due to an excessive volume of blood received in culture bottles Performed at [facility] [address] [place], [place] [address]    Culture   Final    NO GROWTH 4 DAYS Performed at [facility], [place] [address] [place], [place] [address]    Report Status PENDING  Incomplete  Blood Culture (routine x 2)     Status: None (Preliminary result)   Collection Time: 07/02/22  1:00 AM   Specimen: BLOOD RIGHT FOREARM  Result Value Ref Range Status   Specimen Description   Final    BLOOD RIGHT FOREARM Performed at [facility] [address], [place], [place] [address]    Special Requests   Final    BOTTLES DRAWN AEROBIC ONLY Blood Culture adequate volume Performed at [facility] [address] [place], [place] [address]    Culture   Final    NO GROWTH 3 DAYS Performed at [facility], [place] [address] [place], [place] [address]    Report Status PENDING  Incomplete  MRSA Next Gen by PCR, Nasal     Status: Abnormal   Collection Time: 07/02/22  6:51 AM   Specimen: Nasal Mucosa; Nasal Swab  Result Value Ref Range Status   MRSA by PCR Next Gen DETECTED (A) NOT DETECTED Final    Comment: (NOTE) The GeneXpert MRSA Assay (FDA approved for NASAL specimens only), is one component of a comprehensive MRSA colonization surveillance program. It is not intended to diagnose  MRSA infection nor to guide or monitor treatment for MRSA infections. Test performance is not FDA approved in patients less than 38 years old. Performed at [facility], [place] [address], [place], [place] [address]   C Difficile Quick Screen w PCR reflex     Status: None   Collection Time: 07/02/22 11:27 PM   Specimen: STOOL  Result Value Ref Range Status   C Diff antigen NEGATIVE NEGATIVE Final   C Diff toxin NEGATIVE NEGATIVE Final   C Diff interpretation No C. difficile detected.  Final    Comment: Performed at [facility], [place] [address], [place], [place] [address]  Gastrointestinal Panel by PCR , Stool     Status: None   Collection Time: 07/02/22 11:27 PM   Specimen: STOOL  Result Value Ref Range Status   Campylobacter species NOT DETECTED NOT DETECTED Final   Plesimonas shigelloides NOT DETECTED NOT DETECTED Final   Salmonella species NOT DETECTED NOT DETECTED Final   Yersinia enterocolitica NOT DETECTED NOT DETECTED Final   Vibrio species NOT DETECTED NOT DETECTED Final   Vibrio cholerae NOT DETECTED NOT DETECTED Final   Enteroaggregative E coli (EAEC) NOT DETECTED NOT DETECTED Final   Enteropathogenic E coli (EPEC) NOT DETECTED NOT DETECTED Final   Enterotoxigenic E coli (ETEC) NOT DETECTED NOT DETECTED Final   Shiga like toxin producing E coli (STEC) NOT DETECTED NOT DETECTED Final   Shigella/Enteroinvasive E coli (EIEC) NOT DETECTED NOT DETECTED Final   Cryptosporidium NOT DETECTED NOT DETECTED Final   Cyclospora cayetanensis NOT DETECTED NOT DETECTED Final   Entamoeba histolytica NOT DETECTED NOT DETECTED Final   Giardia lamblia NOT DETECTED NOT DETECTED Final   Adenovirus F40/41 NOT DETECTED  NOT DETECTED Final   Astrovirus NOT DETECTED NOT DETECTED Final   Norovirus GI/GII NOT DETECTED NOT DETECTED Final   Rotavirus A NOT DETECTED NOT DETECTED Final   Sapovirus (I, II, IV, and V) NOT DETECTED NOT DETECTED Final    Comment:  Performed at [facility], [address] [place], [place] [address]         Radiology Studies: No results found.      Scheduled Meds:  (feeding supplement) PROSource Plus  30 mL Oral BID BM   vitamin C  1,000 mg Oral Daily   aspirin EC  81 mg Oral Daily   benzonatate  100 mg Oral TID   Chlorhexidine Gluconate Cloth  6 each Topical Q0600   docusate sodium  100 mg Oral Daily   feeding supplement (NEPRO CARB STEADY)  237 mL Oral BID BM   furosemide  40 mg Oral Daily   guaiFENesin  1,200 mg Oral BID   heparin  5,000 Units Subcutaneous Q8H   insulin aspart  20 Units Subcutaneous Once   metroNIDAZOLE  500 mg Oral Q12H   multivitamin  1 tablet Oral QHS   nutrition supplement (JUVEN)  1 packet Oral BID BM   pantoprazole  40 mg Oral Daily   senna-docusate  1 tablet Oral BID   sodium chloride flush  3 mL Intravenous Q12H   sucroferric oxyhydroxide  500 mg Oral TID WC   zinc sulfate  220 mg Oral Daily   Continuous Infusions:  sodium chloride     cefTRIAXone (ROCEPHIN)  IV Stopped (07/03/22 1957)   insulin     magnesium sulfate bolus IVPB     vancomycin       LOS: 4 days    Time spent: 40 minutes    Irine Seal, MD [facility]   To contact the attending provider between 7A-7P or the covering provider during after hours 7P-7A, please log into the web site [email or web address] and access using universal [place] password for that web site. If you do not have the password, please call the hospital operator.  07/05/2022, 10:23 AM

## 2022-07-05 NOTE — Progress Notes (Signed)
[facility] Progress Note   Subjective:   Patient seen and examined at bedside in dialysis.  Tolerating treatment well.  Admits to mild pain in R stump, requesting tylenol.  Otherwise no complaints.  Denies CP, SOB, abdominal pain and n/v/d. Blood sugar >600 this AM.   Objective Vitals:   07/05/22 0900 07/05/22 0902 07/05/22 0930 07/05/22 1000  BP:  (!) 120/53 136/63 120/70  Pulse: 71 71 73 71  Resp: 19 18 15 18   Temp:      TempSrc:      SpO2: 100% 100% 99% 95%  Weight:      Height:       Physical Exam General:well appearing male in NAD Heart:RRR, no mrg Lungs:CTAB, nml WOB on RA Abdomen:soft, NTND Extremities:no LE edema, R BKA dressed Dialysis Access: LU AVF +b/t   Filed Weights   07/04/22 0452 07/04/22 1346 07/05/22 0845  Weight: 99.8 kg 99.8 kg 105.2 kg    Intake/Output Summary (Last 24 hours) at 07/05/2022 1026 Last data filed at 07/05/2022 0800 Gross per 24 hour  Intake 2070 ml  Output 200 ml  Net 1870 ml    Additional Objective Labs: Basic Metabolic Panel: Recent Labs  Lab 07/02/22 0100 07/03/22 0322 07/04/22 0836 07/05/22 0304 07/05/22 0856  NA 135  135   < > 132* 128* 128*  K 3.6  3.6   < > 3.5 3.9 3.7  CL 90*  90*   < > 92* 89* 90*  CO2 29  29   < > 27 23 25   GLUCOSE 82  81   < > 227* 666* 648*  BUN 42*  40*   < > 28* 47* 52*  CREATININE 7.34*  7.26*   < > 6.46* 7.97* 8.44*  CALCIUM 9.4  9.4   < > 9.1 9.2 9.2  PHOS 3.4  --   --  3.1  --    < > = values in this interval not displayed.   Liver Function Tests: Recent Labs  Lab 07/01/22 1344 07/02/22 0100 07/05/22 0304  AST 19  --   --   ALT 17  --   --   ALKPHOS 174*  --   --   BILITOT 2.1*  --   --   PROT 8.3*  --   --   ALBUMIN 2.6* 3.1* 2.3*   CBC: Recent Labs  Lab 07/01/22 1344 07/02/22 0100 07/03/22 0322 07/04/22 0836 07/04/22 1628 07/05/22 0304  WBC 13.3* 14.0* 10.0 9.3  --  10.7*  NEUTROABS 9.6*  --  6.3 5.3  --  9.3*  HGB 9.3* 11.1* 9.3* 10.2*  11.1* 10.6*  HCT 30.8* 37.1* 31.8* 33.9* 35.5* 34.9*  MCV 79.0* 80.5 81.1 79.0*  --  79.3*  PLT 304 342 291 380  --  421*   Blood Culture    Component Value Date/Time   SDES  07/02/2022 0100    BLOOD RIGHT FOREARM Performed at [facility], [place] [address] [place], [place] [address]    SPECREQUEST  07/02/2022 0100    BOTTLES DRAWN AEROBIC ONLY Blood Culture adequate volume Performed at [facility] [address] [place], [place] [address]    CULT  07/02/2022 0100    NO GROWTH 3 DAYS Performed at [facility] [address] [place], [place] [address]    REPTSTATUS PENDING 07/02/2022 0100   CBG: Recent Labs  Lab 07/04/22 1433 07/04/22 1512 07/04/22 1608 07/04/22 2035 07/05/22 0711  GLUCAP  163* 167* 178* 312* >600*   Medications:  sodium chloride     cefTRIAXone (ROCEPHIN)  IV Stopped (07/03/22 1957)   insulin     magnesium sulfate bolus IVPB     vancomycin      (feeding supplement) PROSource Plus  30 mL Oral BID BM   vitamin C  1,000 mg Oral Daily   aspirin EC  81 mg Oral Daily   benzonatate  100 mg Oral TID   Chlorhexidine Gluconate Cloth  6 each Topical Q0600   docusate sodium  100 mg Oral Daily   feeding supplement (NEPRO CARB STEADY)  237 mL Oral BID BM   furosemide  40 mg Oral Daily   guaiFENesin  1,200 mg Oral BID   heparin  5,000 Units Subcutaneous Q8H   insulin aspart  20 Units Subcutaneous Once   metroNIDAZOLE  500 mg Oral Q12H   multivitamin  1 tablet Oral QHS   nutrition supplement (JUVEN)  1 packet Oral BID BM   pantoprazole  40 mg Oral Daily   senna-docusate  1 tablet Oral BID   sodium chloride flush  3 mL Intravenous Q12H   sucroferric oxyhydroxide  500 mg Oral TID WC   zinc sulfate  220 mg Oral Daily    Dialysis Orders: [facility] TTS. 4hrs 58min. F180. Flow rates: 450/500. EDW 109kg. 2K/2.25Cal. AVF.   Meds: Heparin 6000 unit bolus; Mircera 234mcg q2weeks (last dose 8/12); Venofer 50mg   qweekly  Assessment/Plan: 1. Wound infection/cellulitis/abscess - s/p stump revision by Dr. Sharol Given yesterday.  On ABX.  2. ESRD - on HD TTS.  HD today per regular schedule. Holding heparin post surgery. 3. Anemia of CKD-  Hgb 10.6. ESA due next week.  4. Secondary hyperparathyroidism - Ca and phos in goal.  Continue VDRA/binders.  5. HTN/volume - Blood pressure well controlled. Continue home meds. Does not appear volume overloaded on exam, UF as tolerated.  6. Nutrition - Renal diet w/fluid restrictions. Alb 2.3 - give protein supplements. 7. Dispo - will need skilled nursing on d/c. 8. Diabetes - hypoglycemia previously and now hyperglycemia noted this AM. Insulin per PMD. 9. Diarrhea - C diff neg  Jen Mow, PA-C [place] 07/05/2022,10:26 AM  LOS: 4 days

## 2022-07-05 NOTE — Inpatient Diabetes Management (Signed)
Inpatient Diabetes Program Recommendations  AACE/ADA: New Consensus Statement on Inpatient Glycemic Control (2015)  Target Ranges:  Prepandial:   less than 140 mg/dL      Peak postprandial:   less than 180 mg/dL (1-2 hours)      Critically ill patients:  140 - 180 mg/dL   Lab Results  Component Value Date   GLUCAP 134 (H) 07/05/2022   HGBA1C 8.2 (H) 05/24/2022    Review of Glycemic Control  Latest Reference Range & Units 07/04/22 16:08 07/04/22 20:35 07/05/22 07:11 07/05/22 10:39 07/05/22 11:58 07/05/22 14:01  Glucose-Capillary 70 - 99 mg/dL 178 (H) 312 (H)  Novolog 10 units given at 0601 >600 (HH)  Novolog 25 units given at 0806 345 (H)  Novolog 20 units 229 (H) 134 (H)   Diabetes history: DM 2 Outpatient Diabetes medications: Lantus 30 units Current orders for Inpatient glycemic control:  Semglee 25 units qhs Novolog 0-15 units Q4  Nepro bid between meals   Inpatient Diabetes Program Recommendations:    Semglee dose on 8/16 given, missed dose on 8/17 decadron 10 mg also given, Semglee dose given on 8/18, however, glucose trends increased to mid 600 range. Due to renal function, pt may experience hypoglycemia after the Novolog boluses.   Will watch trends.   Thanks,  Tama Headings RN, MSN, BC-ADM Inpatient Diabetes Coordinator Team Pager [phone] (8a-5p)

## 2022-07-05 NOTE — Progress Notes (Signed)
Inpatient Rehab Admissions Coordinator:   Pt. From LTC SNF, plan is to return. CIR will sign off.   Clemens Catholic, [place], [place] Admissions Coordinator  [phone] ([place]) [phone] (office)

## 2022-07-05 NOTE — Progress Notes (Signed)
OT Cancellation Note  Patient Details Name: CAMRON MONDAY Sr. MRN: [id number] DOB: July 01, 1961   Cancelled Treatment:    Reason Eval/Treat Not Completed: Patient at procedure or test/ unavailable. Pt is off the floor for HD at this time, OT will follow up as schedule allows.   Ula Couvillon Elane Kyarah Enamorado 07/05/2022, 9:16 AM

## 2022-07-05 NOTE — Progress Notes (Signed)
Received patient in bed to unit.  Alert and oriented.  Informed consent signed and in  chart.   Treatment initiated: 0902 Treatment completed: 1250  Patient tolerated well.  Transported back to the room  alert, without acute distress.  Hand-off given to patient's nurse.   Access used: AVfistula Access issues: none  Total UF removed: 1800 Medication(s) given: insulin by floor nurse Post HD VS: 119/68,75,17,100% Post HD weight: 103.6kg   [facility]

## 2022-07-05 NOTE — Progress Notes (Signed)
PT Cancellation Note  Patient Details Name: [name] BYNUM Sr. MRN: [id number] DOB: [birth year]   Cancelled Treatment:    Reason Eval/Treat Not Completed: Patient at procedure or test/unavailable  Patient gone for dialysis. Will reattempt this p.m.    [place]  Office [phone]   Rexanne Mano 07/05/2022, 9:36 AM

## 2022-07-05 NOTE — Progress Notes (Signed)
Date and time results received: 07/05/22 @  0940   Test: Glucose Critical Value: 648  Name of Provider Notified: Dr. Jonnie Finner  Orders Received? Or Actions Taken?: No new orders received.

## 2022-07-06 DIAGNOSIS — T8781 Dehiscence of amputation stump: Secondary | ICD-10-CM | POA: Diagnosis not present

## 2022-07-06 DIAGNOSIS — L03115 Cellulitis of right lower limb: Secondary | ICD-10-CM | POA: Diagnosis not present

## 2022-07-06 DIAGNOSIS — I5022 Chronic systolic (congestive) heart failure: Secondary | ICD-10-CM | POA: Diagnosis not present

## 2022-07-06 DIAGNOSIS — L02415 Cutaneous abscess of right lower limb: Secondary | ICD-10-CM | POA: Diagnosis not present

## 2022-07-06 LAB — BASIC METABOLIC PANEL
Anion gap: 13 (ref 5–15)
Anion gap: 14 (ref 5–15)
BUN: 36 mg/dL — ABNORMAL HIGH (ref 6–20)
BUN: 49 mg/dL — ABNORMAL HIGH (ref 6–20)
CO2: 29 mmol/L (ref 22–32)
CO2: 26 mmol/L (ref 22–32)
Calcium: 9.3 mg/dL (ref 8.9–10.3)
Calcium: 9.2 mg/dL (ref 8.9–10.3)
Chloride: 90 mmol/L — ABNORMAL LOW (ref 98–111)
Chloride: 89 mmol/L — ABNORMAL LOW (ref 98–111)
Creatinine, Ser: 5.99 mg/dL — ABNORMAL HIGH (ref 0.61–1.24)
Creatinine, Ser: 6.51 mg/dL — ABNORMAL HIGH (ref 0.61–1.24)
GFR, Estimated: 10 mL/min — ABNORMAL LOW (ref >60)
GFR, Estimated: 9 mL/min — ABNORMAL LOW (ref >60)
Glucose, Bld: 265 mg/dL — ABNORMAL HIGH (ref 70–99)
Glucose, Bld: 300 mg/dL — ABNORMAL HIGH (ref 70–99)
Potassium: 2.9 mmol/L — ABNORMAL LOW (ref 3.5–5.1)
Potassium: 3.3 mmol/L — ABNORMAL LOW (ref 3.5–5.1)
Sodium: 132 mmol/L — ABNORMAL LOW (ref 135–145)
Sodium: 129 mmol/L — ABNORMAL LOW (ref 135–145)

## 2022-07-06 LAB — CULTURE, BLOOD (ROUTINE X 2): Culture: NO GROWTH

## 2022-07-06 LAB — GLUCOSE, CAPILLARY
Glucose-Capillary: 196 mg/dL — ABNORMAL HIGH (ref 70–99)
Glucose-Capillary: 234 mg/dL — ABNORMAL HIGH (ref 70–99)
Glucose-Capillary: 240 mg/dL — ABNORMAL HIGH (ref 70–99)
Glucose-Capillary: 245 mg/dL — ABNORMAL HIGH (ref 70–99)
Glucose-Capillary: 287 mg/dL — ABNORMAL HIGH (ref 70–99)
Glucose-Capillary: 339 mg/dL — ABNORMAL HIGH (ref 70–99)

## 2022-07-06 LAB — CBC
HCT: 32 % — ABNORMAL LOW (ref 39.0–52.0)
Hemoglobin: 10.1 g/dL — ABNORMAL LOW (ref 13.0–17.0)
MCH: 24.6 pg — ABNORMAL LOW (ref 26.0–34.0)
MCHC: 31.6 g/dL (ref 30.0–36.0)
MCV: 77.9 fL — ABNORMAL LOW (ref 80.0–100.0)
Platelets: 464 10*3/uL — ABNORMAL HIGH (ref 150–400)
RBC: 4.11 MIL/uL — ABNORMAL LOW (ref 4.22–5.81)
RDW: 21.8 % — ABNORMAL HIGH (ref 11.5–15.5)
WBC: 15 10*3/uL — ABNORMAL HIGH (ref 4.0–10.5)
nRBC: 0.6 % — ABNORMAL HIGH (ref 0.0–0.2)

## 2022-07-06 LAB — MAGNESIUM: Magnesium: 1.8 mg/dL (ref 1.7–2.4)

## 2022-07-06 MED ORDER — INSULIN GLARGINE-YFGN 100 UNIT/ML ~~LOC~~ SOLN
30.0000 [IU] | Freq: Every day | SUBCUTANEOUS | Status: DC
  Administered 2022-07-06 – 2022-07-07 (×2): 30 [IU] via SUBCUTANEOUS
  Filled 2022-07-06 (×3): qty 0.3

## 2022-07-06 MED ORDER — INSULIN ASPART 100 UNIT/ML IJ SOLN
4.0000 [IU] | Freq: Three times a day (TID) | INTRAMUSCULAR | Status: DC
  Administered 2022-07-06 – 2022-07-08 (×4): 4 [IU] via SUBCUTANEOUS

## 2022-07-06 MED ORDER — INSULIN ASPART 100 UNIT/ML IJ SOLN
0.0000 [IU] | Freq: Three times a day (TID) | INTRAMUSCULAR | Status: DC
  Administered 2022-07-06: 5 [IU] via SUBCUTANEOUS
  Administered 2022-07-06: 11 [IU] via SUBCUTANEOUS
  Administered 2022-07-07: 5 [IU] via SUBCUTANEOUS
  Administered 2022-07-07 – 2022-07-08 (×3): 3 [IU] via SUBCUTANEOUS
  Administered 2022-07-08: 2 [IU] via SUBCUTANEOUS

## 2022-07-06 NOTE — Progress Notes (Signed)
[facility] Progress Note   Subjective:   Patient seen and examined at bedside.  Continues to have diarrhea.  Denies CP, SOB, abdominal pain and n/v.  Asking if he can get more spray to numb his arm when they come to collect blood.    Objective Vitals:   07/05/22 1558 07/05/22 2038 07/06/22 0443 07/06/22 1007  BP: 134/68 122/67 (!) 145/73 138/73  Pulse: 83 70 74 77  Resp:  17 18 18   Temp: 97.8 F (36.6 C) 97.8 F (36.6 C) 98.1 F (36.7 C) 97.8 F (36.6 C)  TempSrc: Oral  Oral Oral  SpO2: 100% 100% 100% 99%  Weight:      Height:       Physical Exam General:well appearing male in NAD Heart:RRR, no mrg Lungs:CTAB, nml WOB on RA Abdomen:soft, NTND Extremities:no LE edema, R BKA Dialysis Access: LU AVF +b/t   Filed Weights   07/04/22 0452 07/04/22 1346 07/05/22 0845  Weight: 99.8 kg 99.8 kg 105.2 kg    Intake/Output Summary (Last 24 hours) at 07/06/2022 1123 Last data filed at 07/06/2022 0717 Gross per 24 hour  Intake 100 ml  Output 1.8 ml  Net 98.2 ml    Additional Objective Labs: Basic Metabolic Panel: Recent Labs  Lab 07/02/22 0100 07/03/22 0322 07/05/22 0304 07/05/22 0856 07/05/22 1820 07/05/22 2229 07/06/22 0156  NA 135  135   < > 128*   < > 132* 132* 132*  K 3.6  3.6   < > 3.9   < > 3.0* 3.1* 2.9*  CL 90*  90*   < > 89*   < > 93* 92* 90*  CO2 29  29   < > 23   < > 27 27 29   GLUCOSE 82  81   < > 666*   < > 150* 239* 265*  BUN 42*  40*   < > 47*   < > 31* 32* 36*  CREATININE 7.34*  7.26*   < > 7.97*   < > 5.07* 5.61* 5.99*  CALCIUM 9.4  9.4   < > 9.2   < > 9.3 9.2 9.3  PHOS 3.4  --  3.1  --   --   --   --    < > = values in this interval not displayed.   Liver Function Tests: Recent Labs  Lab 07/01/22 1344 07/02/22 0100 07/05/22 0304  AST 19  --   --   ALT 17  --   --   ALKPHOS 174*  --   --   BILITOT 2.1*  --   --   PROT 8.3*  --   --   ALBUMIN 2.6* 3.1* 2.3*    CBC: Recent Labs  Lab 07/01/22 1344 07/02/22 0100  07/03/22 0322 07/04/22 0836 07/04/22 1628 07/05/22 0304  WBC 13.3* 14.0* 10.0 9.3  --  10.7*  NEUTROABS 9.6*  --  6.3 5.3  --  9.3*  HGB 9.3* 11.1* 9.3* 10.2* 11.1* 10.6*  HCT 30.8* 37.1* 31.8* 33.9* 35.5* 34.9*  MCV 79.0* 80.5 81.1 79.0*  --  79.3*  PLT 304 342 291 380  --  421*   Blood Culture    Component Value Date/Time   SDES  07/02/2022 0100    BLOOD RIGHT FOREARM Performed at [facility], [place] [address], [place], [place] [address]    SPECREQUEST  07/02/2022 0100    BOTTLES DRAWN AEROBIC ONLY Blood Culture adequate volume Performed at [facility], [place] [address] [place], [place] [address]    CULT  07/02/2022 0100    NO GROWTH 4 DAYS Performed at [place] [address] [place], [place] [address]    REPTSTATUS PENDING 07/02/2022 0100    CBG: Recent Labs  Lab 07/05/22 1600 07/05/22 2036 07/06/22 0010 07/06/22 0445 07/06/22 0730  GLUCAP 133* 193* 287* 240* 196*    Medications:  sodium chloride     cefTRIAXone (ROCEPHIN)  IV Stopped (07/05/22 1456)   magnesium sulfate bolus IVPB     vancomycin 1,000 mg (07/05/22 1557)    (feeding supplement) PROSource Plus  30 mL Oral BID BM   vitamin C  1,000 mg Oral Daily   aspirin EC  81 mg Oral Daily   benzonatate  100 mg Oral TID   Chlorhexidine Gluconate Cloth  6 each Topical Q0600   docusate sodium  100 mg Oral Daily   feeding supplement (NEPRO CARB STEADY)  237 mL Oral BID BM   furosemide  40 mg Oral Daily   guaiFENesin  1,200 mg Oral BID   heparin  5,000 Units Subcutaneous Q8H   insulin aspart  0-15 Units Subcutaneous TID WC   insulin glargine-yfgn  30 Units Subcutaneous QHS   metroNIDAZOLE  500 mg Oral Q12H   multivitamin  1 tablet Oral QHS   nutrition supplement (JUVEN)  1 packet Oral BID BM   pantoprazole  40 mg Oral Daily   senna-docusate  1 tablet Oral BID   sodium chloride flush  3 mL Intravenous Q12H   sucroferric oxyhydroxide  500 mg Oral TID WC    zinc sulfate  220 mg Oral Daily    Dialysis Orders: [facility] [place] [facility] TTS. 4hrs 80min. F180. Flow rates: 450/500. EDW 109kg. 2K/2.25Cal. AVF.    Meds: Heparin 6000 unit bolus; Mircera 216mcg q2weeks (last dose 8/12); Venofer 50mg  qweekly   Assessment/Plan: 1. Wound infection/cellulitis/abscess - s/p stump revision by Dr. Sharol Given yesterday.  On ABX.  2. ESRD - on HD TTS.  Next HD 07/08/22. Holding heparin post surgery. 3. Anemia of CKD-  Last Hgb 10.6. ESA due next week.  4. Secondary hyperparathyroidism - Ca and phos in goal.  Continue VDRA/binders.  5. HTN/volume - Blood pressure well controlled. Continue home meds. Does not appear volume overloaded on exam, UF as tolerated.  6. Nutrition - Heart healthy diet. Alb 2.3 - give protein supplements.  Follow labs closely, if K^, change back to renal diet.  7. Dispo - will need skilled nursing on d/c. 8. Diabetes - Insulin per PMD. 9. Diarrhea - ongoing.  C diff neg/GI panel negative. 10. Hypokalemia - has prn supplements ordered.  Liberalize diet.   Jen Mow, PA-C [facility] 07/06/2022,11:23 AM  LOS: 5 days

## 2022-07-06 NOTE — Progress Notes (Signed)
PROGRESS NOTE    [name] Cruel Sr.  JJK:[id number] DOB: [birth year] DOA: 07/01/2022 PCP: Pcp, No    Chief Complaint  Patient presents with   Wound Infection    Brief Narrative:  [name] Cruel Sr. is a 61 y.o. male with PMH significant for ESRD-HD-TTS, DM2, HTN, HLD, CAD/MI, recent right BKA Patient was brought to the ED on 8/15 from [facility] for evaluation of redness, drainage from his amputation site concerning for possible infection.   Patient recently underwent right BKA on 7/7 by Dr. Sharol Given for right foot osteomyelitis.  While at the rehab, he was noted to have progressive worsening of redness, drainage and pain at the site of amputation and hence sent to the ED.   In the ED, he had an initial temperature of 100.9, heart rate 102, blood pressure 146/81, 90% O2 sat on room air. Labs showed WBC of 13.3, hemoglobin 9.3, BNP more than 1600, lactic acid 1.3, glucose low at 56. CXR with cardiomegaly and pulmonary congestion.    In the ED, patient was started on vancomycin/Rocephin/ flagyl.   Admitted to hospitalist service.   Assessment & Plan:   Principal Problem:   Cellulitis of right leg Active Problems:   Abscess of right lower leg   Dehiscence of amputation stump of right lower extremity (HCC)   DM2 (diabetes mellitus, type 2) (HCC)   ESRD (end stage renal disease) (HCC)   HTN (hypertension)   Chronic systolic CHF (congestive heart failure) (HCC)   GERD (gastroesophageal reflux disease)   Pressure injury of skin   Diarrhea   Hyperosmolar hyperglycemic state (HHS) ([place])  #1 status post right BKA with amputation stump infection/wound dehiscence/Abscess right BKA -Patient status post right BKA 05/24/19/2023, sent from SNF with erythema, drainage, fever concerning for abscess versus cellulitis. -Patient seen in consultation by orthopedics, Dr. Sharol Given who discussed with patient and patient would not consider an amputation above the knee at this time and patient  underwent revision of right transtibial amputation in the OR 07/04/2022 with abscess noted. Ortho form wound VAC placed. -Continue empiric IV vancomycin, IV Rocephin, Flagyl. -ID consulted and are following.   -Orthopedics following. -Follow.  2.  End-stage renal disease HD-TTS -Patient noted to have missed dialysis on presentation in the ED. -Nephrology following and patient receiving hemodialysis. -Per nephrology.  3.  Diarrhea -Patient multiple loose stools noted early on the hospitalization which has improved. -C. difficile PCR negative, GI pathogen panel negative. -Discontinued enteric precautions. -Imodium as needed.  4.  Poorly controlled type 2 diabetes mellitus/hyperglycemia/hyperosmolar hyperglycemia -Hemoglobin A1c 8.2 on 05/24/2022. -CBG 196 this morning. -Patient noted earlier in the hospitalization to have low blood glucose levels of 51. -Lantus dose decreased to 25 units daily, SSI. -Patient noted to have blood glucose level of > 600 the a.m. of 07/05/2022., stat BMET of 648.   -Hyperglycemic hyperosmolar state likely secondary to acute infection in the setting of diet noncompliance.   -Patient received total of 45 units of NovoLog with improvement with blood glucose levels and as such insulin drip discontinued.   -CBG at 196 this morning.   -Increase Lantus/Semglee to 30 units daily.   -Place on meal coverage NovoLog 4 units 3 times daily with meals.   -Diabetes coordinator following.   5.  Chronic systolic CHF/hypertension -Stable. -Continue Norvasc daily.  6.  History of CAD/MI -Aspirin.  7.  GERD -Continue PPI.  8.  Hypomagnesemia -Likely secondary to GI losses. -Magnesium currently at 1.8. -Repeat labs in  the AM.  9.  Stage III left medial pressure injury buttocks, POA Pressure Injury 07/02/22 Buttocks Left;Medial Stage 3 -  Full thickness tissue loss. Subcutaneous fat may be visible but bone, tendon or muscle are NOT exposed. (Active)  07/02/22 0058   Location: Buttocks  Location Orientation: Left;Medial  Staging: Stage 3 -  Full thickness tissue loss. Subcutaneous fat may be visible but bone, tendon or muscle are NOT exposed.  Wound Description (Comments):   Present on Admission: Yes         DVT prophylaxis: Heparin Code Status: Full Family Communication: Updated patient.  No family at bedside. Disposition: Back to skilled nursing facility when medically stable and cleared by orthopedics and ID.  Status is: Inpatient Remains inpatient appropriate because: Severity of illness   Consultants:  Orthopedics: Dr. Sharol Given 07/03/2022 Nephrology: Dr. Candiss Norse 07/01/2022 ID: Dr. Gale Journey 07/04/2022  Procedures: Chest x-ray 07/01/2022 Plain films of the right knee 07/01/2022 Transfusion 1 unit packed red blood cells 07/04/2022 preop Revision amputation below the knee per Dr. Sharol Given orthopedics 07/04/2022  Antimicrobials:  IV Rocephin 07/01/2022>>>> 07/08/2022 IV vancomycin 07/01/2022>>>>> Oral Flagyl 07/01/2022>>>> 07/08/2022   Subjective: Sitting up in bed.  No chest pain.  No shortness of breath.  No abdominal pain.  Tolerating current diet.  CBGs improved.  Objective: Vitals:   07/05/22 1558 07/05/22 2038 07/06/22 0443 07/06/22 1007  BP: 134/68 122/67 (!) 145/73 138/73  Pulse: 83 70 74 77  Resp:  17 18 18   Temp: 97.8 F (36.6 C) 97.8 F (36.6 C) 98.1 F (36.7 C) 97.8 F (36.6 C)  TempSrc: Oral  Oral Oral  SpO2: 100% 100% 100% 99%  Weight:      Height:        Intake/Output Summary (Last 24 hours) at 07/06/2022 1153 Last data filed at 07/06/2022 0717 Gross per 24 hour  Intake 100 ml  Output 1.8 ml  Net 98.2 ml    Filed Weights   07/04/22 0452 07/04/22 1346 07/05/22 0845  Weight: 99.8 kg 99.8 kg 105.2 kg    Examination:  General exam: NAD Respiratory system: Scattered diffuse crackles.  No wheezes, no crackles, no rhonchi.  Fair air movement.  Speaking in full sentences.  Cardiovascular system: RRR no murmurs rubs or gallops.   No JVD.  No lower extremity edema.  Gastrointestinal system: Abdomen is soft, nontender, nondistended, positive bowel sounds.  No rebound.  No guarding.   Central nervous system: Alert and oriented. No focal neurological deficits. Extremities: Right BKA with Ortho form wound VAC.  Skin: No rashes, lesions or ulcers Psychiatry: Judgement and insight appear normal. Mood & affect appropriate.     Data Reviewed: I have personally reviewed following labs and imaging studies  CBC: Recent Labs  Lab 07/01/22 1344 07/02/22 0100 07/03/22 0322 07/04/22 0836 07/04/22 1628 07/05/22 0304  WBC 13.3* 14.0* 10.0 9.3  --  10.7*  NEUTROABS 9.6*  --  6.3 5.3  --  9.3*  HGB 9.3* 11.1* 9.3* 10.2* 11.1* 10.6*  HCT 30.8* 37.1* 31.8* 33.9* 35.5* 34.9*  MCV 79.0* 80.5 81.1 79.0*  --  79.3*  PLT 304 342 291 380  --  421*     Basic Metabolic Panel: Recent Labs  Lab 07/02/22 0100 07/03/22 0322 07/04/22 0836 07/05/22 0304 07/05/22 0856 07/05/22 1413 07/05/22 1820 07/05/22 2229 07/06/22 0156  NA 135  135   < > 132* 128* 128* 132* 132* 132* 132*  K 3.6  3.6   < > 3.5 3.9 3.7 3.3* 3.0*  3.1* 2.9*  CL 90*  90*   < > 92* 89* 90* 91* 93* 92* 90*  CO2 29  29   < > 27 23 25 26 27 27 29   GLUCOSE 82  81   < > 227* 666* 648* 152* 150* 239* 265*  BUN 42*  40*   < > 28* 47* 52* 25* 31* 32* 36*  CREATININE 7.34*  7.26*   < > 6.46* 7.97* 8.44* 4.69* 5.07* 5.61* 5.99*  CALCIUM 9.4  9.4   < > 9.1 9.2 9.2 9.2 9.3 9.2 9.3  MG  --   --  1.6* 2.2  --   --   --   --   --   PHOS 3.4  --   --  3.1  --   --   --   --   --    < > = values in this interval not displayed.     GFR: Estimated Creatinine Clearance: 16.9 mL/min (A) (by C-G formula based on SCr of 5.99 mg/dL (H)).  Liver Function Tests: Recent Labs  Lab 07/01/22 1344 07/02/22 0100 07/05/22 0304  AST 19  --   --   ALT 17  --   --   ALKPHOS 174*  --   --   BILITOT 2.1*  --   --   PROT 8.3*  --   --   ALBUMIN 2.6* 3.1* 2.3*      CBG: Recent Labs  Lab 07/05/22 2036 07/06/22 0010 07/06/22 0445 07/06/22 0730 07/06/22 1143  GLUCAP 193* 287* 240* 196* 339*      Recent Results (from the past 240 hour(s))  Urine Culture     Status: None   Collection Time: 07/01/22 11:44 AM   Specimen: In/Out Cath Urine  Result Value Ref Range Status   Specimen Description   Final    IN/OUT CATH URINE Performed at [facility], [place] [address] [place], [place] [address]    Special Requests   Final    NONE Performed at [facility], [place] [address] [place], [place] [address]    Culture   Final    NO GROWTH Performed at [facility], [place] [address], [place], [place] [address]    Report Status 07/04/2022 FINAL  Final  SARS Coronavirus 2 by RT PCR (hospital order, performed in [facility] hospital lab) *cepheid single result test* Anterior Nasal Swab     Status: None   Collection Time: 07/01/22 11:47 AM   Specimen: Anterior Nasal Swab  Result Value Ref Range Status   SARS Coronavirus 2 by RT PCR NEGATIVE NEGATIVE Final    Comment: (NOTE) SARS-CoV-2 target nucleic acids are NOT DETECTED.  The SARS-CoV-2 RNA is generally detectable in upper and lower respiratory specimens during the acute phase of infection. The lowest concentration of SARS-CoV-2 viral copies this assay can detect is 250 copies / mL. A negative result does not preclude SARS-CoV-2 infection and should not be used as the sole basis for treatment or other patient management decisions.  A negative result may occur with improper specimen collection / handling, submission of specimen other than nasopharyngeal swab, presence of viral mutation(s) within the areas targeted by this assay, and inadequate number of viral copies (<250 copies / mL). A negative result must be combined with clinical observations, patient history, and epidemiological information.  Fact Sheet for Patients:    https://www.patel.info/  Fact Sheet for Healthcare Providers: https://hall.com/  This test is not  yet approved or  cleared by the [place] and has been authorized for detection and/or diagnosis of SARS-CoV-2 by FDA under an Emergency Use Authorization (EUA).  This EUA will remain in effect (meaning this test can be used) for the duration of the COVID-19 declaration under Section 564(b)(1) of the Act, 21 U.S.C. section 360bbb-3(b)(1), unless the authorization is terminated or revoked sooner.  Performed at [facility], [place] [address], [place], [place] [address]   Blood Culture (routine x 2)     Status: None   Collection Time: 07/01/22  1:44 PM   Specimen: BLOOD  Result Value Ref Range Status   Specimen Description   Final    BLOOD BLOOD RIGHT FOREARM Performed at [place] [address] [place], [place] [address]    Special Requests   Final    BOTTLES DRAWN AEROBIC AND ANAEROBIC Blood Culture results may not be optimal due to an excessive volume of blood received in culture bottles Performed at [place] [address] [place], [place] [address]    Culture   Final    NO GROWTH 5 DAYS Performed at [facility], [place] [address] [place], [place] [address]    Report Status 07/06/2022 FINAL  Final  Blood Culture (routine x 2)     Status: None (Preliminary result)   Collection Time: 07/02/22  1:00 AM   Specimen: BLOOD RIGHT FOREARM  Result Value Ref Range Status   Specimen Description   Final    BLOOD RIGHT FOREARM Performed at [place] [address] [place], [place] [address]    Special Requests   Final    BOTTLES DRAWN AEROBIC ONLY Blood Culture adequate volume Performed at [place] [address] [place], [place] [address]    Culture   Final    NO GROWTH 4 DAYS Performed at [facility],  [place] [address], [place], [place] [address]    Report Status PENDING  Incomplete  MRSA Next Gen by PCR, Nasal     Status: Abnormal   Collection Time: 07/02/22  6:51 AM   Specimen: Nasal Mucosa; Nasal Swab  Result Value Ref Range Status   MRSA by PCR Next Gen DETECTED (A) NOT DETECTED Final    Comment: (NOTE) The GeneXpert MRSA Assay (FDA approved for NASAL specimens only), is one component of a comprehensive MRSA colonization surveillance program. It is not intended to diagnose MRSA infection nor to guide or monitor treatment for MRSA infections. Test performance is not FDA approved in patients less than 54 years old. Performed at [facility], [place] [address], [place], [place] [address]   C Difficile Quick Screen w PCR reflex     Status: None   Collection Time: 07/02/22 11:27 PM   Specimen: STOOL  Result Value Ref Range Status   C Diff antigen NEGATIVE NEGATIVE Final   C Diff toxin NEGATIVE NEGATIVE Final   C Diff interpretation No C. difficile detected.  Final    Comment: Performed at [facility], [place] [address] [place], [place] [address]  Gastrointestinal Panel by PCR , Stool     Status: None   Collection Time: 07/02/22 11:27 PM   Specimen: STOOL  Result Value Ref Range Status   Campylobacter species NOT DETECTED NOT DETECTED Final   Plesimonas shigelloides NOT DETECTED NOT DETECTED Final   Salmonella species NOT DETECTED NOT DETECTED Final   Yersinia enterocolitica  NOT DETECTED NOT DETECTED Final   Vibrio species NOT DETECTED NOT DETECTED Final   Vibrio cholerae NOT DETECTED NOT DETECTED Final   Enteroaggregative E coli (EAEC) NOT DETECTED NOT DETECTED Final   Enteropathogenic E coli (EPEC) NOT DETECTED NOT DETECTED Final   Enterotoxigenic E coli (ETEC) NOT DETECTED NOT DETECTED Final   Shiga like toxin producing E coli (STEC) NOT DETECTED NOT DETECTED Final   Shigella/Enteroinvasive E coli (EIEC) NOT DETECTED NOT DETECTED Final    Cryptosporidium NOT DETECTED NOT DETECTED Final   Cyclospora cayetanensis NOT DETECTED NOT DETECTED Final   Entamoeba histolytica NOT DETECTED NOT DETECTED Final   Giardia lamblia NOT DETECTED NOT DETECTED Final   Adenovirus F40/41 NOT DETECTED NOT DETECTED Final   Astrovirus NOT DETECTED NOT DETECTED Final   Norovirus GI/GII NOT DETECTED NOT DETECTED Final   Rotavirus A NOT DETECTED NOT DETECTED Final   Sapovirus (I, II, IV, and V) NOT DETECTED NOT DETECTED Final    Comment: Performed at [facility], [address], [place], [place] [address]         Radiology Studies: No results found.      Scheduled Meds:  (feeding supplement) PROSource Plus  30 mL Oral BID BM   vitamin C  1,000 mg Oral Daily   aspirin EC  81 mg Oral Daily   benzonatate  100 mg Oral TID   Chlorhexidine Gluconate Cloth  6 each Topical Q0600   docusate sodium  100 mg Oral Daily   feeding supplement (NEPRO CARB STEADY)  237 mL Oral BID BM   furosemide  40 mg Oral Daily   guaiFENesin  1,200 mg Oral BID   heparin  5,000 Units Subcutaneous Q8H   insulin aspart  0-15 Units Subcutaneous TID WC   insulin glargine-yfgn  30 Units Subcutaneous QHS   metroNIDAZOLE  500 mg Oral Q12H   multivitamin  1 tablet Oral QHS   nutrition supplement (JUVEN)  1 packet Oral BID BM   pantoprazole  40 mg Oral Daily   senna-docusate  1 tablet Oral BID   sodium chloride flush  3 mL Intravenous Q12H   sucroferric oxyhydroxide  500 mg Oral TID WC   zinc sulfate  220 mg Oral Daily   Continuous Infusions:  sodium chloride     cefTRIAXone (ROCEPHIN)  IV Stopped (07/05/22 1456)   magnesium sulfate bolus IVPB     vancomycin 1,000 mg (07/05/22 1557)     LOS: 5 days    Time spent: 40 minutes    Irine Seal, MD [facility]   To contact the attending provider between 7A-7P or the covering provider during after hours 7P-7A, please log into the web site [email or web address] and access using universal [place]  password for that web site. If you do not have the password, please call the hospital operator.  07/06/2022, 11:53 AM

## 2022-07-06 NOTE — Inpatient Diabetes Management (Signed)
Inpatient Diabetes Program Recommendations  AACE/ADA: New Consensus Statement on Inpatient Glycemic Control (2015)  Target Ranges:  Prepandial:   less than 140 mg/dL      Peak postprandial:   less than 180 mg/dL (1-2 hours)      Critically ill patients:  140 - 180 mg/dL   Lab Results  Component Value Date   GLUCAP 339 (H) 07/06/2022   HGBA1C 8.2 (H) 05/24/2022    Review of Glycemic Control  Latest Reference Range & Units 07/06/22 04:45 07/06/22 07:30 07/06/22 11:43  Glucose-Capillary 70 - 99 mg/dL 240 (H) 196 (H) 339 (H)   Cellulitis of right leg Diabetes history: DM 2 Outpatient Diabetes medications: Lantus 30 units qhs Current orders for Inpatient glycemic control:  Semglee 30 units qhs Novolog 0-15 units tid  Nepro bid between meals  Inpatient Diabetes Program Recommendations:    Glucose trends increase after PO intake.  -  Consider Novolog 4 units tid meal coverage.   Thanks,  Tama Headings RN, MSN, BC-ADM Inpatient Diabetes Coordinator Team Pager [phone] (8a-5p)

## 2022-07-07 ENCOUNTER — Telehealth: Payer: Self-pay | Admitting: Orthopedic Surgery

## 2022-07-07 ENCOUNTER — Encounter (HOSPITAL_COMMUNITY): Payer: Self-pay | Admitting: Orthopedic Surgery

## 2022-07-07 DIAGNOSIS — L02415 Cutaneous abscess of right lower limb: Secondary | ICD-10-CM | POA: Diagnosis not present

## 2022-07-07 DIAGNOSIS — I5022 Chronic systolic (congestive) heart failure: Secondary | ICD-10-CM | POA: Diagnosis not present

## 2022-07-07 DIAGNOSIS — Z992 Dependence on renal dialysis: Secondary | ICD-10-CM | POA: Diagnosis not present

## 2022-07-07 DIAGNOSIS — L03115 Cellulitis of right lower limb: Secondary | ICD-10-CM | POA: Diagnosis not present

## 2022-07-07 DIAGNOSIS — N186 End stage renal disease: Secondary | ICD-10-CM | POA: Diagnosis not present

## 2022-07-07 DIAGNOSIS — T8781 Dehiscence of amputation stump: Secondary | ICD-10-CM | POA: Diagnosis not present

## 2022-07-07 LAB — RENAL FUNCTION PANEL
Albumin: 2.1 g/dL — ABNORMAL LOW (ref 3.5–5.0)
Anion gap: 16 — ABNORMAL HIGH (ref 5–15)
BUN: 64 mg/dL — ABNORMAL HIGH (ref 6–20)
CO2: 23 mmol/L (ref 22–32)
Calcium: 9.1 mg/dL (ref 8.9–10.3)
Chloride: 92 mmol/L — ABNORMAL LOW (ref 98–111)
Creatinine, Ser: 7.87 mg/dL — ABNORMAL HIGH (ref 0.61–1.24)
GFR, Estimated: 7 mL/min — ABNORMAL LOW (ref >60)
Glucose, Bld: 223 mg/dL — ABNORMAL HIGH (ref 70–99)
Phosphorus: 5.2 mg/dL — ABNORMAL HIGH (ref 2.5–4.6)
Potassium: 3.3 mmol/L — ABNORMAL LOW (ref 3.5–5.1)
Sodium: 131 mmol/L — ABNORMAL LOW (ref 135–145)

## 2022-07-07 LAB — CBC WITH DIFFERENTIAL/PLATELET
Abs Immature Granulocytes: 0.59 10*3/uL — ABNORMAL HIGH (ref 0.00–0.07)
Basophils Absolute: 0.2 10*3/uL — ABNORMAL HIGH (ref 0.0–0.1)
Basophils Relative: 1 %
Eosinophils Absolute: 1.3 10*3/uL — ABNORMAL HIGH (ref 0.0–0.5)
Eosinophils Relative: 9 %
HCT: 31.9 % — ABNORMAL LOW (ref 39.0–52.0)
Hemoglobin: 9.7 g/dL — ABNORMAL LOW (ref 13.0–17.0)
Immature Granulocytes: 4 %
Lymphocytes Relative: 14 %
Lymphs Abs: 2.1 10*3/uL (ref 0.7–4.0)
MCH: 23.8 pg — ABNORMAL LOW (ref 26.0–34.0)
MCHC: 30.4 g/dL (ref 30.0–36.0)
MCV: 78.4 fL — ABNORMAL LOW (ref 80.0–100.0)
Monocytes Absolute: 1.5 10*3/uL — ABNORMAL HIGH (ref 0.1–1.0)
Monocytes Relative: 10 %
Neutro Abs: 9.5 10*3/uL — ABNORMAL HIGH (ref 1.7–7.7)
Neutrophils Relative %: 62 %
Platelets: 495 10*3/uL — ABNORMAL HIGH (ref 150–400)
RBC: 4.07 MIL/uL — ABNORMAL LOW (ref 4.22–5.81)
RDW: 22.1 % — ABNORMAL HIGH (ref 11.5–15.5)
WBC: 15.1 10*3/uL — ABNORMAL HIGH (ref 4.0–10.5)
nRBC: 0.6 % — ABNORMAL HIGH (ref 0.0–0.2)

## 2022-07-07 LAB — CULTURE, BLOOD (ROUTINE X 2)
Culture: NO GROWTH
Special Requests: ADEQUATE

## 2022-07-07 LAB — GLUCOSE, CAPILLARY
Glucose-Capillary: 206 mg/dL — ABNORMAL HIGH (ref 70–99)
Glucose-Capillary: 154 mg/dL — ABNORMAL HIGH (ref 70–99)
Glucose-Capillary: 175 mg/dL — ABNORMAL HIGH (ref 70–99)
Glucose-Capillary: 246 mg/dL — ABNORMAL HIGH (ref 70–99)

## 2022-07-07 MED ORDER — CHLORHEXIDINE GLUCONATE CLOTH 2 % EX PADS
6.0000 | MEDICATED_PAD | Freq: Every day | CUTANEOUS | Status: DC
  Administered 2022-07-08: 6 via TOPICAL

## 2022-07-07 MED ORDER — MUPIROCIN 2 % EX OINT
1.0000 | TOPICAL_OINTMENT | Freq: Two times a day (BID) | CUTANEOUS | Status: DC
  Administered 2022-07-07 – 2022-07-08 (×3): 1 via NASAL
  Filled 2022-07-07 (×2): qty 22

## 2022-07-07 MED ORDER — CHLORHEXIDINE GLUCONATE CLOTH 2 % EX PADS
6.0000 | MEDICATED_PAD | Freq: Every day | CUTANEOUS | Status: DC
  Administered 2022-07-07 – 2022-07-08 (×2): 6 via TOPICAL

## 2022-07-07 NOTE — Care Management Important Message (Signed)
Important Message  Patient Details  Name: [name] DOANE Sr. MRN: [id number] Date of Birth: Jul 24, 1961   Medicare Important Message Given:  Yes     Jessamy Torosyan 07/07/2022, 3:16 PM

## 2022-07-07 NOTE — Telephone Encounter (Addendum)
Patient's daughter Luetta Nutting Valinda Party) called asked if she could get a note e-mailed to her stating she was with her father at the hospital during his surgery 07/04/2022. [email or web address]    [phone]

## 2022-07-07 NOTE — Consult Note (Signed)
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 No culture sent  Will plan 2 weeks abx and reevaluate in clinic off abx for persistent of infection   We reviewed prior culture of the foot. There was morganella. Given good debridement and no deep seated process, reasonable to use amox-clav for coverage on discharge  Plan: Continue current antimicrobial On discharge can change to doxycycline and amox-clav for total of 2 week abx from 8/18 I will see him in ID clinic on 9/12 @ 1045 F/u ortho as per their recommendation Discussed with primary team    [facility] [place], [place], [place] [address] Phone: [phone]    I spent 75 minute reviewing data/chart, and coordinating care and >50% direct face to face time providing counseling/discussing diagnostics/treatment plan with patient          ------------------------------------------------ Principal Problem:   Cellulitis of right leg Active Problems:   ESRD (end stage renal disease) ([place])   DM2 (diabetes mellitus, type 2) (HCC)   HTN (hypertension)   Chronic systolic CHF (congestive heart failure) (HCC)   GERD (gastroesophageal reflux disease)   Pressure injury of skin   Dehiscence of amputation stump of right lower extremity ([name])   Abscess of right lower leg   Diarrhea   Hyperosmolar hyperglycemic state (HHS) ([place])    HPI: [name] ANASTASI Sr. is a 61 y.o. male admitted for left bka stump infection  Patient had bka for  right foot diabetic infection on 7/07; no postop abx course given. Prior to that had failed chronic OM of the right foot tx. He had developed early post-op wound infection and admitted 8/15 for evaluation  He presented with fever and purulence/dehiscence of the right bka stump  He is currently in the OR right now but was started on vanc/cefttriaxone/flagyl in the ED His admission bcx have been negative His mrsa nares screen had returned positive He has no leukocytosis at presentation  He has "expected surgical changes of the right bka stump" on xray  Patient doing well post-op. No culture sent No f/c/n/v/diarrhea Bka stump pain well controlled  No other complaint  Family History  Problem Relation Age of Onset   Hypertension Mother     Social History   Tobacco Use   Smoking status: Never   Smokeless tobacco: Never  Vaping Use   Vaping Use: Never used  Substance Use Topics   Alcohol use: Not Currently   Drug use: Never    Allergies  Allergen Reactions   Sulfa Antibiotics Nausea And Vomiting and Other (See Comments)    "Allergic," per MAR    Review of Systems: ROS All Other ROS was negative, except mentioned above   Past Medical History:  Diagnosis Date   CHF (congestive heart failure) ([place])    ECHO 02/05/22  EF 45-50%, DD indeterminent   Diabetes mellitus without complication ([place])    type 2   ESRD on hemodialysis ([place])  mon wed fri   Hypertension    Kidney failure    Myocardial infarction ([place])    Nonischemic cardiomyopathy (HCC)        Scheduled Meds:  (feeding supplement) PROSource Plus  30 mL Oral BID BM   vitamin C  1,000 mg Oral Daily   aspirin EC  81 mg Oral Daily   benzonatate  100 mg Oral TID   Chlorhexidine Gluconate Cloth  6 each Topical Q0600   Chlorhexidine Gluconate Cloth  6 each Topical Q0600   [START ON 07/08/2022] Chlorhexidine Gluconate Cloth  6 each Topical Q0600   docusate sodium  100 mg Oral Daily   feeding supplement (NEPRO  CARB STEADY)  237 mL Oral BID BM   furosemide  40 mg Oral Daily   guaiFENesin  1,200 mg Oral BID   heparin  5,000 Units Subcutaneous Q8H   insulin aspart  0-15 Units Subcutaneous TID WC   insulin aspart  4 Units Subcutaneous TID WC   insulin glargine-yfgn  30 Units Subcutaneous QHS   metroNIDAZOLE  500 mg Oral Q12H   multivitamin  1 tablet Oral QHS   mupirocin ointment  1 Application Nasal BID   nutrition supplement (JUVEN)  1 packet Oral BID BM   pantoprazole  40 mg Oral Daily   senna-docusate  1 tablet Oral BID   sodium chloride flush  3 mL Intravenous Q12H   sucroferric oxyhydroxide  500 mg Oral TID WC   zinc sulfate  220 mg Oral Daily   Continuous Infusions:  sodium chloride     cefTRIAXone (ROCEPHIN)  IV 2 g (07/06/22 1353)   magnesium sulfate bolus IVPB     vancomycin 1,000 mg (07/05/22 1557)   PRN Meds:.sodium chloride, acetaminophen **OR** acetaminophen, acetaminophen, alum & mag hydroxide-simeth, bisacodyl, dextrose, guaiFENesin-dextromethorphan, hydrALAZINE, HYDROmorphone (DILAUDID) injection, ipratropium-albuterol, labetalol, loperamide, magnesium sulfate bolus IVPB, metoprolol tartrate, ondansetron, ondansetron, oxyCODONE, oxyCODONE, oxyCODONE-acetaminophen, phenol, polyethylene glycol, potassium chloride, sodium chloride flush   OBJECTIVE: Blood pressure 132/65, pulse 75, temperature 98.1 F (36.7 C), resp. rate 17, height 6\' 1"  (1.854 m), weight 105.2 kg, SpO2 95 %.  Physical Exam  General/constitutional: no distress, pleasant HEENT: Normocephalic, PER, Conj Clear, EOMI, Oropharynx clear Neck supple CV: rrr no mrg Lungs: clear to auscultation, normal respiratory effort Abd: Soft, Nontender Ext: no edema Skin: No Rash Neuro: nonfocal MSK: bka stump with wound vac in place; no tenderness/fluctuance/surrounding cellulitis   Lab Results Lab Results  Component Value Date   WBC 15.1 (H) 07/07/2022   HGB 9.7 (L) 07/07/2022   HCT 31.9 (L) 07/07/2022   MCV 78.4  (L) 07/07/2022   PLT 495 (H) 07/07/2022    Lab Results  Component Value Date   CREATININE 7.87 (H) 07/07/2022   BUN 64 (H) 07/07/2022   NA 131 (L) 07/07/2022   K 3.3 (L) 07/07/2022   CL 92 (L) 07/07/2022   CO2 23 07/07/2022    Lab Results  Component Value Date   ALT 17 07/01/2022   AST 19 07/01/2022   ALKPHOS 174 (H) 07/01/2022   BILITOT 2.1 (H) 07/01/2022      Microbiology: Recent Results (from the past 240 hour(s))  Urine Culture     Status: None   Collection Time: 07/01/22 11:44 AM   Specimen: In/Out Cath Urine  Result Value Ref Range Status   Specimen Description   Final    IN/OUT CATH URINE Performed at [facility], [address] [place], [place] [address]    Special  Requests   Final    NONE Performed at [facility], [place] [address] [place], [place] [address]    Culture   Final    NO GROWTH Performed at [facility], [place] [address], [place], [place] [address]    Report Status 07/04/2022 FINAL  Final  SARS Coronavirus 2 by RT PCR (hospital order, performed in [facility] hospital lab) *cepheid single result test* Anterior Nasal Swab     Status: None   Collection Time: 07/01/22 11:47 AM   Specimen: Anterior Nasal Swab  Result Value Ref Range Status   SARS Coronavirus 2 by RT PCR NEGATIVE NEGATIVE Final    Comment: (NOTE) SARS-CoV-2 target nucleic acids are NOT DETECTED.  The SARS-CoV-2 RNA is generally detectable in upper and lower respiratory specimens during the acute phase of infection. The lowest concentration of SARS-CoV-2 viral copies this assay can detect is 250 copies / mL. A negative result does not preclude SARS-CoV-2 infection and should not be used as the sole basis for treatment or other patient management decisions.  A negative result may occur with improper specimen collection / handling, submission of specimen other than nasopharyngeal swab, presence of viral mutation(s) within the areas  targeted by this assay, and inadequate number of viral copies (<250 copies / mL). A negative result must be combined with clinical observations, patient history, and epidemiological information.  Fact Sheet for Patients:   https://www.patel.info/  Fact Sheet for Healthcare Providers: https://hall.com/  This test is not yet approved or  cleared by the [place] FDA and has been authorized for detection and/or diagnosis of SARS-CoV-2 by FDA under an Emergency Use Authorization (EUA).  This EUA will remain in effect (meaning this test can be used) for the duration of the COVID-19 declaration under Section 564(b)(1) of the Act, 21 U.S.C. section 360bbb-3(b)(1), unless the authorization is terminated or revoked sooner.  Performed at [facility], [place] [address], [place], [place] [address]   Blood Culture (routine x 2)     Status: None   Collection Time: 07/01/22  1:44 PM   Specimen: BLOOD  Result Value Ref Range Status   Specimen Description   Final    BLOOD BLOOD RIGHT FOREARM Performed at [facility] [address] [place], [place] [address]    Special Requests   Final    BOTTLES DRAWN AEROBIC AND ANAEROBIC Blood Culture results may not be optimal due to an excessive volume of blood received in culture bottles Performed at [facility] [address] [place], [place] [address]    Culture   Final    NO GROWTH 5 DAYS Performed at [facility], [place] [address] [place], [place] [address]    Report Status 07/06/2022 FINAL  Final  Blood Culture (routine x 2)     Status: None   Collection Time: 07/02/22  1:00 AM   Specimen: BLOOD RIGHT FOREARM  Result Value Ref Range Status   Specimen Description   Final    BLOOD RIGHT FOREARM Performed at [facility] [address], [place], [place] [address]    Special Requests   Final    BOTTLES DRAWN AEROBIC  ONLY Blood Culture adequate volume Performed at [facility] [address] [place], [place] [address]    Culture   Final    NO GROWTH 5 DAYS Performed at [facility], [place] [address], [place], [place] [address]  Report Status 07/07/2022 FINAL  Final  MRSA Next Gen by PCR, Nasal     Status: Abnormal   Collection Time: 07/02/22  6:51 AM   Specimen: Nasal Mucosa; Nasal Swab  Result Value Ref Range Status   MRSA by PCR Next Gen DETECTED (A) NOT DETECTED Final    Comment: (NOTE) The GeneXpert MRSA Assay (FDA approved for NASAL specimens only), is one component of a comprehensive MRSA colonization surveillance program. It is not intended to diagnose MRSA infection nor to guide or monitor treatment for MRSA infections. Test performance is not FDA approved in patients less than 84 years old. Performed at [facility], [place] [address], [place], [place] [address]   C Difficile Quick Screen w PCR reflex     Status: None   Collection Time: 07/02/22 11:27 PM   Specimen: STOOL  Result Value Ref Range Status   C Diff antigen NEGATIVE NEGATIVE Final   C Diff toxin NEGATIVE NEGATIVE Final   C Diff interpretation No C. difficile detected.  Final    Comment: Performed at [facility], [place] [address], [place], [place] [address]  Gastrointestinal Panel by PCR , Stool     Status: None   Collection Time: 07/02/22 11:27 PM   Specimen: STOOL  Result Value Ref Range Status   Campylobacter species NOT DETECTED NOT DETECTED Final   Plesimonas shigelloides NOT DETECTED NOT DETECTED Final   Salmonella species NOT DETECTED NOT DETECTED Final   Yersinia enterocolitica NOT DETECTED NOT DETECTED Final   Vibrio species NOT DETECTED NOT DETECTED Final   Vibrio cholerae NOT DETECTED NOT DETECTED Final   Enteroaggregative E coli (EAEC) NOT DETECTED NOT DETECTED Final   Enteropathogenic E coli (EPEC) NOT DETECTED NOT DETECTED Final    Enterotoxigenic E coli (ETEC) NOT DETECTED NOT DETECTED Final   Shiga like toxin producing E coli (STEC) NOT DETECTED NOT DETECTED Final   Shigella/Enteroinvasive E coli (EIEC) NOT DETECTED NOT DETECTED Final   Cryptosporidium NOT DETECTED NOT DETECTED Final   Cyclospora cayetanensis NOT DETECTED NOT DETECTED Final   Entamoeba histolytica NOT DETECTED NOT DETECTED Final   Giardia lamblia NOT DETECTED NOT DETECTED Final   Adenovirus F40/41 NOT DETECTED NOT DETECTED Final   Astrovirus NOT DETECTED NOT DETECTED Final   Norovirus GI/GII NOT DETECTED NOT DETECTED Final   Rotavirus A NOT DETECTED NOT DETECTED Final   Sapovirus (I, II, IV, and V) NOT DETECTED NOT DETECTED Final    Comment: Performed at [facility], [address], [place], [place] [address]     Serology:    Imaging: If present, new imagings (plain films, ct scans, and mri) have been personally visualized and interpreted; radiology reports have been reviewed. Decision making incorporated into the Impression / Recommendations.  8/15 xray right knee Expected post-op changes  Jabier Mutton, [facility] for [facility]ious [place] [phone] pager    07/07/2022, 3:30 PM

## 2022-07-07 NOTE — Progress Notes (Addendum)
[facility] Progress Note   Subjective:   Pt seen in room. No concerns, denies SOB, CP, dizziness, abdominal pain and nausea.   Objective Vitals:   07/06/22 1632 07/06/22 2059 07/07/22 0453 07/07/22 0920  BP: (!) 146/71 (!) 143/73 (!) 140/69 132/65  Pulse: 79 78 72 75  Resp: 18 18 18 17   Temp: 97.9 F (36.6 C) 98.4 F (36.9 C) 98.6 F (37 C) 98.1 F (36.7 C)  TempSrc:   Oral   SpO2: 95% 99% 98% 95%  Weight:      Height:       Physical Exam General: Alert male in NAD Heart: RRR, no murmurs, rubs or gallops Lungs: CTA bilaterally without wheezing, rhonchi or rales Abdomen: Soft, non-tender, non-distended, +BS Extremities:R BKA, no pitting edema Dialysis Access: LUE AVF + bruit  Additional Objective Labs: Basic Metabolic Panel: Recent Labs  Lab 07/02/22 0100 07/03/22 0322 07/05/22 0304 07/05/22 0856 07/06/22 0156 07/06/22 1100 07/07/22 0434  NA 135  135   < > 128*   < > 132* 129* 131*  K 3.6  3.6   < > 3.9   < > 2.9* 3.3* 3.3*  CL 90*  90*   < > 89*   < > 90* 89* 92*  CO2 29  29   < > 23   < > 29 26 23   GLUCOSE 82  81   < > 666*   < > 265* 300* 223*  BUN 42*  40*   < > 47*   < > 36* 49* 64*  CREATININE 7.34*  7.26*   < > 7.97*   < > 5.99* 6.51* 7.87*  CALCIUM 9.4  9.4   < > 9.2   < > 9.3 9.2 9.1  PHOS 3.4  --  3.1  --   --   --  5.2*   < > = values in this interval not displayed.   Liver Function Tests: Recent Labs  Lab 07/01/22 1344 07/02/22 0100 07/05/22 0304 07/07/22 0434  AST 19  --   --   --   ALT 17  --   --   --   ALKPHOS 174*  --   --   --   BILITOT 2.1*  --   --   --   PROT 8.3*  --   --   --   ALBUMIN 2.6* 3.1* 2.3* 2.1*   No results for input(s): "LIPASE", "AMYLASE" in the last 168 hours. CBC: Recent Labs  Lab 07/03/22 0322 07/04/22 0836 07/04/22 1628 07/05/22 0304 07/06/22 1100 07/07/22 0434  WBC 10.0 9.3  --  10.7* 15.0* 15.1*  NEUTROABS 6.3 5.3  --  9.3*  --  PENDING  HGB 9.3* 10.2*   < > 10.6* 10.1*  9.7*  HCT 31.8* 33.9*   < > 34.9* 32.0* 31.9*  MCV 81.1 79.0*  --  79.3* 77.9* 78.4*  PLT 291 380  --  421* 464* 495*   < > = values in this interval not displayed.   Blood Culture    Component Value Date/Time   SDES  07/02/2022 0100    BLOOD RIGHT FOREARM Performed at [facility], [place] [address] [place], [place] [address]    SPECREQUEST  07/02/2022 0100    BOTTLES DRAWN AEROBIC ONLY Blood Culture adequate volume Performed at [place] [address] [place], [place] [address]    CULT  07/02/2022 0100    NO GROWTH 5 DAYS Performed  at [facility], [place] [address], [place], [place] [address]    REPTSTATUS 07/07/2022 FINAL 07/02/2022 0100    Cardiac Enzymes: No results for input(s): "CKTOTAL", "CKMB", "CKMBINDEX", "TROPONINI" in the last 168 hours. CBG: Recent Labs  Lab 07/06/22 0730 07/06/22 1143 07/06/22 1632 07/06/22 2100 07/07/22 0731  GLUCAP 196* 339* 245* 234* 206*   Iron Studies: No results for input(s): "IRON", "TIBC", "TRANSFERRIN", "FERRITIN" in the last 72 hours. @lablastinr3 @ Studies/Results: No results found. Medications:  sodium chloride     cefTRIAXone (ROCEPHIN)  IV 2 g (07/06/22 1353)   magnesium sulfate bolus IVPB     vancomycin 1,000 mg (07/05/22 1557)    (feeding supplement) PROSource Plus  30 mL Oral BID BM   vitamin C  1,000 mg Oral Daily   aspirin EC  81 mg Oral Daily   benzonatate  100 mg Oral TID   Chlorhexidine Gluconate Cloth  6 each Topical Q0600   docusate sodium  100 mg Oral Daily   feeding supplement (NEPRO CARB STEADY)  237 mL Oral BID BM   furosemide  40 mg Oral Daily   guaiFENesin  1,200 mg Oral BID   heparin  5,000 Units Subcutaneous Q8H   insulin aspart  0-15 Units Subcutaneous TID WC   insulin aspart  4 Units Subcutaneous TID WC   insulin glargine-yfgn  30 Units Subcutaneous QHS   metroNIDAZOLE  500 mg Oral Q12H   multivitamin  1 tablet Oral QHS   nutrition supplement  (JUVEN)  1 packet Oral BID BM   pantoprazole  40 mg Oral Daily   senna-docusate  1 tablet Oral BID   sodium chloride flush  3 mL Intravenous Q12H   sucroferric oxyhydroxide  500 mg Oral TID WC   zinc sulfate  220 mg Oral Daily    Dialysis Orders: [facility] TTS. 4hrs 1min. F180. Flow rates: 450/500. EDW 109kg. 2K/2.25Cal. AVF.    Meds: Heparin 6000 unit bolus; Mircera 253mcg q2weeks (last dose 8/12); Venofer 50mg  qweekly  Assessment/Plan: 1. Wound infection/cellulitis/abscess - s/p stump revision by Dr. Sharol Given.  On ABX.  2. ESRD - on HD TTS.  Next HD 07/08/22. Holding heparin post surgery. 3. Anemia of CKD-  Last Hgb 9.7. ESA due next week.  4. Secondary hyperparathyroidism - Corrected calcium elevated. Will decrease calcitriol dose. Phos controlled, continue binders.  5. HTN/volume - Blood pressure well controlled. Continue home meds. Does not appear volume overloaded on exam but Na is low, UF as tolerated.  6. Nutrition - Heart healthy diet. Alb 2.3 - give protein supplements.  Follow labs closely, if K^, change back to renal diet.  7. Dispo - will need skilled nursing on d/c. 8. Diabetes - Insulin per PMD. 9. Diarrhea - ongoing.  C diff neg/GI panel negative. 10. Hypokalemia - has prn supplements ordered.  Liberalized diet.     Anice Paganini, PA-C 07/07/2022, 10:57 AM  [facility] Pager: [phone]

## 2022-07-07 NOTE — Progress Notes (Addendum)
PROGRESS NOTE    [name] Cruel Sr.  GGE:[id number] DOB: [birth year] DOA: 07/01/2022 PCP: Pcp, No    Chief Complaint  Patient presents with   Wound Infection    Brief Narrative:  [name] Cruel Sr. is a 61 y.o. male with PMH significant for ESRD-HD-TTS, DM2, HTN, HLD, CAD/MI, recent right BKA Patient was brought to the ED on 8/15 from [facility] for evaluation of redness, drainage from his amputation site concerning for possible infection.   Patient recently underwent right BKA on 7/7 by Dr. Sharol Given for right foot osteomyelitis.  While at the rehab, he was noted to have progressive worsening of redness, drainage and pain at the site of amputation and hence sent to the ED.   In the ED, he had an initial temperature of 100.9, heart rate 102, blood pressure 146/81, 90% O2 sat on room air. Labs showed WBC of 13.3, hemoglobin 9.3, BNP more than 1600, lactic acid 1.3, glucose low at 56. CXR with cardiomegaly and pulmonary congestion.    In the ED, patient was started on vancomycin/Rocephin/ flagyl.   Admitted to hospitalist service.   Assessment & Plan:   Principal Problem:   Cellulitis of right leg Active Problems:   Abscess of right lower leg   Dehiscence of amputation stump of right lower extremity (HCC)   DM2 (diabetes mellitus, type 2) (HCC)   ESRD (end stage renal disease) (HCC)   HTN (hypertension)   Chronic systolic CHF (congestive heart failure) (HCC)   GERD (gastroesophageal reflux disease)   Pressure injury of skin   Diarrhea   Hyperosmolar hyperglycemic state (HHS) ([place])  #1 status post right BKA with amputation stump infection/wound dehiscence/Abscess right BKA -Patient status post right BKA 05/24/19/2023, sent from SNF with erythema, drainage, fever concerning for abscess versus cellulitis. -Patient seen in consultation by orthopedics, Dr. Sharol Given who discussed with patient and patient would not consider an amputation above the knee at this time and patient  underwent revision of right transtibial amputation in the OR 07/04/2022 with abscess noted. Ortho form wound VAC placed. -Continue empiric IV vancomycin, IV Rocephin, Flagyl. -ID consulted and are following.   -Orthopedics following. -Follow.  2.  End-stage renal disease HD-TTS -Patient noted to have missed dialysis on presentation in the ED. -Nephrology following and patient receiving hemodialysis. -Per nephrology.  3.  Diarrhea -Patient multiple loose stools noted early on the hospitalization which has improved. -C. difficile PCR negative, GI pathogen panel negative. -Discontinued enteric precautions. -Diarrhea improved. -Imodium as needed.  4.  Poorly controlled type 2 diabetes mellitus/hyperglycemia/hyperosmolar hyperglycemia -Hemoglobin A1c 8.2 on 05/24/2022. -CBG 206 this morning. -Patient noted earlier in the hospitalization to have low blood glucose levels of 51. -Lantus dose decreased to 25 units daily, SSI. -Patient noted to have blood glucose level of > 600 the a.m. of 07/05/2022., stat BMET of 648.   -Hyperglycemic hyperosmolar state likely secondary to acute infection in the setting of diet noncompliance.   -Patient received total of 45 units of NovoLog with improvement with blood glucose levels and as such insulin drip discontinued.   -Continue Lantus/Semglee to 30 units daily.   -Continue meal coverage NovoLog 4 units 3 times daily with meals.   -Diabetes coordinator following.   5.  Chronic systolic CHF/hypertension -Stable. -Continue Norvasc.   6.  History of CAD/MI -Aspirin.  7.  GERD -PPI.    8.  Hypomagnesemia/hypokalemia -Likely secondary to GI losses. -Magnesium currently at 1.8. -Potassium at 3.3, defer supplementation to nephrology. -Repeat  labs in the AM.  9.  Stage III left medial pressure injury buttocks, POA Pressure Injury 07/02/22 Buttocks Left;Medial Stage 3 -  Full thickness tissue loss. Subcutaneous fat may be visible but bone, tendon or  muscle are NOT exposed. (Active)  07/02/22 0058  Location: Buttocks  Location Orientation: Left;Medial  Staging: Stage 3 -  Full thickness tissue loss. Subcutaneous fat may be visible but bone, tendon or muscle are NOT exposed.  Wound Description (Comments):   Present on Admission: Yes         DVT prophylaxis: Heparin Code Status: Full Family Communication: Updated patient.  No family at bedside. Disposition: Back to skilled nursing facility when medically stable and cleared by orthopedics and ID, hopefully in the next 24 hours.  Status is: Inpatient Remains inpatient appropriate because: Severity of illness   Consultants:  Orthopedics: Dr. Sharol Given 07/03/2022 Nephrology: Dr. Candiss Norse 07/01/2022 ID: Dr. Gale Journey 07/04/2022  Procedures: Chest x-ray 07/01/2022 Plain films of the right knee 07/01/2022 Transfusion 1 unit packed red blood cells 07/04/2022 preop Revision amputation below the knee per Dr. Sharol Given orthopedics 07/04/2022  Antimicrobials:  IV Rocephin 07/01/2022>>>> 07/08/2022 IV vancomycin 07/01/2022>>>>> Oral Flagyl 07/01/2022>>>> 07/08/2022   Subjective: On the telephone with his daughter.  Denies any chest pain.  No shortness of breath.  No abdominal pain.  Tolerating oral intake.    Objective: Vitals:   07/06/22 1632 07/06/22 2059 07/07/22 0453 07/07/22 0920  BP: (!) 146/71 (!) 143/73 (!) 140/69 132/65  Pulse: 79 78 72 75  Resp: 18 18 18 17   Temp: 97.9 F (36.6 C) 98.4 F (36.9 C) 98.6 F (37 C) 98.1 F (36.7 C)  TempSrc:   Oral   SpO2: 95% 99% 98% 95%  Weight:      Height:        Intake/Output Summary (Last 24 hours) at 07/07/2022 1156 Last data filed at 07/07/2022 0800 Gross per 24 hour  Intake 860 ml  Output 0 ml  Net 860 ml    Filed Weights   07/04/22 0452 07/04/22 1346 07/05/22 0845  Weight: 99.8 kg 99.8 kg 105.2 kg    Examination:  General exam: NAD Respiratory system: Lungs clear to auscultation anterior lung fields.  No wheezes, no crackles, no rhonchi.   Fair air movement.   Cardiovascular system: Regular rate rhythm no murmurs rubs or gallops.  No JVD.  No lower extremity edema.  Gastrointestinal system: Abdomen is soft, nontender, nondistended, positive bowel sounds.  No rebound.  No guarding.  Central nervous system: Alert and oriented. No focal neurological deficits. Extremities: Right BKA with Ortho form wound VAC.  Skin: No rashes, lesions or ulcers Psychiatry: Judgement and insight appear normal. Mood & affect appropriate.     Data Reviewed: I have personally reviewed following labs and imaging studies  CBC: Recent Labs  Lab 07/01/22 1344 07/02/22 0100 07/03/22 0322 07/04/22 0836 07/04/22 1628 07/05/22 0304 07/06/22 1100 07/07/22 0434  WBC 13.3*   < > 10.0 9.3  --  10.7* 15.0* 15.1*  NEUTROABS 9.6*  --  6.3 5.3  --  9.3*  --  PENDING  HGB 9.3*   < > 9.3* 10.2* 11.1* 10.6* 10.1* 9.7*  HCT 30.8*   < > 31.8* 33.9* 35.5* 34.9* 32.0* 31.9*  MCV 79.0*   < > 81.1 79.0*  --  79.3* 77.9* 78.4*  PLT 304   < > 291 380  --  421* 464* 495*   < > = values in this interval not displayed.  Basic Metabolic Panel: Recent Labs  Lab 07/02/22 0100 07/03/22 0322 07/04/22 0836 07/05/22 0304 07/05/22 0856 07/05/22 1820 07/05/22 2229 07/06/22 0156 07/06/22 1100 07/07/22 0434  NA 135  135   < > 132* 128*   < > 132* 132* 132* 129* 131*  K 3.6  3.6   < > 3.5 3.9   < > 3.0* 3.1* 2.9* 3.3* 3.3*  CL 90*  90*   < > 92* 89*   < > 93* 92* 90* 89* 92*  CO2 29  29   < > 27 23   < > 27 27 29 26 23   GLUCOSE 82  81   < > 227* 666*   < > 150* 239* 265* 300* 223*  BUN 42*  40*   < > 28* 47*   < > 31* 32* 36* 49* 64*  CREATININE 7.34*  7.26*   < > 6.46* 7.97*   < > 5.07* 5.61* 5.99* 6.51* 7.87*  CALCIUM 9.4  9.4   < > 9.1 9.2   < > 9.3 9.2 9.3 9.2 9.1  MG  --   --  1.6* 2.2  --   --   --   --  1.8  --   PHOS 3.4  --   --  3.1  --   --   --   --   --  5.2*   < > = values in this interval not displayed.     GFR: Estimated  Creatinine Clearance: 12.9 mL/min (A) (by C-G formula based on SCr of 7.87 mg/dL (H)).  Liver Function Tests: Recent Labs  Lab 07/01/22 1344 07/02/22 0100 07/05/22 0304 07/07/22 0434  AST 19  --   --   --   ALT 17  --   --   --   ALKPHOS 174*  --   --   --   BILITOT 2.1*  --   --   --   PROT 8.3*  --   --   --   ALBUMIN 2.6* 3.1* 2.3* 2.1*     CBG: Recent Labs  Lab 07/06/22 1143 07/06/22 1632 07/06/22 2100 07/07/22 0731 07/07/22 1130  GLUCAP 339* 245* 234* 206* 154*      Recent Results (from the past 240 hour(s))  Urine Culture     Status: None   Collection Time: 07/01/22 11:44 AM   Specimen: In/Out Cath Urine  Result Value Ref Range Status   Specimen Description   Final    IN/OUT CATH URINE Performed at [facility], [place] [address] [place], [place] [address]    Special Requests   Final    NONE Performed at [facility], [place] [address], [place], [place] [address]    Culture   Final    NO GROWTH Performed at [facility], [place] [address], [place], [place] [address]    Report Status 07/04/2022 FINAL  Final  SARS Coronavirus 2 by RT PCR (hospital order, performed in [facility] hospital lab) *cepheid single result test* Anterior Nasal Swab     Status: None   Collection Time: 07/01/22 11:47 AM   Specimen: Anterior Nasal Swab  Result Value Ref Range Status   SARS Coronavirus 2 by RT PCR NEGATIVE NEGATIVE Final    Comment: (NOTE) SARS-CoV-2 target nucleic acids are NOT DETECTED.  The SARS-CoV-2 RNA is generally detectable in upper and lower respiratory specimens during the acute phase of infection. The lowest concentration of SARS-CoV-2 viral  copies this assay can detect is 250 copies / mL. A negative result does not preclude SARS-CoV-2 infection and should not be used as the sole basis for treatment or other patient management decisions.  A negative result may occur with improper specimen collection / handling,  submission of specimen other than nasopharyngeal swab, presence of viral mutation(s) within the areas targeted by this assay, and inadequate number of viral copies (<250 copies / mL). A negative result must be combined with clinical observations, patient history, and epidemiological information.  Fact Sheet for Patients:   https://www.patel.info/  Fact Sheet for Healthcare Providers: https://hall.com/  This test is not yet approved or  cleared by the [place] FDA and has been authorized for detection and/or diagnosis of SARS-CoV-2 by FDA under an Emergency Use Authorization (EUA).  This EUA will remain in effect (meaning this test can be used) for the duration of the COVID-19 declaration under Section 564(b)(1) of the Act, 21 U.S.C. section 360bbb-3(b)(1), unless the authorization is terminated or revoked sooner.  Performed at [facility], [place] [address], [place], [place] [address]   Blood Culture (routine x 2)     Status: None   Collection Time: 07/01/22  1:44 PM   Specimen: BLOOD  Result Value Ref Range Status   Specimen Description   Final    BLOOD BLOOD RIGHT FOREARM Performed at [place] [address], [place], [place] [address]    Special Requests   Final    BOTTLES DRAWN AEROBIC AND ANAEROBIC Blood Culture results may not be optimal due to an excessive volume of blood received in culture bottles Performed at [place] [address] [place], [place] [address]    Culture   Final    NO GROWTH 5 DAYS Performed at [facility], [place] [address], [place], [place] [address]    Report Status 07/06/2022 FINAL  Final  Blood Culture (routine x 2)     Status: None   Collection Time: 07/02/22  1:00 AM   Specimen: BLOOD RIGHT FOREARM  Result Value Ref Range Status   Specimen Description   Final    BLOOD RIGHT FOREARM Performed at [facility] [address], [place], [place] [address]    Special Requests   Final    BOTTLES DRAWN AEROBIC ONLY Blood Culture adequate volume Performed at [facility] [address] [place], [place] [address]    Culture   Final    NO GROWTH 5 DAYS Performed at [facility], [place] [address], [place], [place] [address]    Report Status 07/07/2022 FINAL  Final  MRSA Next Gen by PCR, Nasal     Status: Abnormal   Collection Time: 07/02/22  6:51 AM   Specimen: Nasal Mucosa; Nasal Swab  Result Value Ref Range Status   MRSA by PCR Next Gen DETECTED (A) NOT DETECTED Final    Comment: (NOTE) The GeneXpert MRSA Assay (FDA approved for NASAL specimens only), is one component of a comprehensive MRSA colonization surveillance program. It is not intended to diagnose MRSA infection nor to guide or monitor treatment for MRSA infections. Test performance is not FDA approved in patients less than 67 years old. Performed at [facility], [place] [address] [place], [place] [address]   C Difficile Quick Screen w PCR reflex     Status: None   Collection Time: 07/02/22 11:27 PM   Specimen: STOOL  Result Value  Ref Range Status   C Diff antigen NEGATIVE NEGATIVE Final   C Diff toxin NEGATIVE NEGATIVE Final   C Diff interpretation No C. difficile detected.  Final    Comment: Performed at [facility], [place] [address] [place], [place] [address]  Gastrointestinal Panel by PCR , Stool     Status: None   Collection Time: 07/02/22 11:27 PM   Specimen: STOOL  Result Value Ref Range Status   Campylobacter species NOT DETECTED NOT DETECTED Final   Plesimonas shigelloides NOT DETECTED NOT DETECTED Final   Salmonella species NOT DETECTED NOT DETECTED Final   Yersinia enterocolitica NOT DETECTED NOT DETECTED Final   Vibrio species NOT DETECTED NOT DETECTED Final   Vibrio cholerae NOT DETECTED NOT DETECTED Final   Enteroaggregative E coli (EAEC) NOT  DETECTED NOT DETECTED Final   Enteropathogenic E coli (EPEC) NOT DETECTED NOT DETECTED Final   Enterotoxigenic E coli (ETEC) NOT DETECTED NOT DETECTED Final   Shiga like toxin producing E coli (STEC) NOT DETECTED NOT DETECTED Final   Shigella/Enteroinvasive E coli (EIEC) NOT DETECTED NOT DETECTED Final   Cryptosporidium NOT DETECTED NOT DETECTED Final   Cyclospora c[place]etanensis NOT DETECTED NOT DETECTED Final   Entamoeba histolytica NOT DETECTED NOT DETECTED Final   Giardia lamblia NOT DETECTED NOT DETECTED Final   Adenovirus F40/41 NOT DETECTED NOT DETECTED Final   Astrovirus NOT DETECTED NOT DETECTED Final   Norovirus GI/GII NOT DETECTED NOT DETECTED Final   Rotavirus A NOT DETECTED NOT DETECTED Final   Sapovirus (I, II, IV, and V) NOT DETECTED NOT DETECTED Final    Comment: Performed at [facility], [address], [place],  [address]         Radiology Studies: No results found.      Scheduled Meds:  (feeding supplement) PROSource Plus  30 mL Oral BID BM   vitamin C  1,000 mg Oral Daily   aspirin EC  81 mg Oral Daily   benzonatate  100 mg Oral TID   Chlorhexidine Gluconate Cloth  6 each Topical Q0600   Chlorhexidine Gluconate Cloth  6 each Topical Q0600   docusate sodium  100 mg Oral Daily   feeding supplement (NEPRO CARB STEADY)  237 mL Oral BID BM   furosemide  40 mg Oral Daily   guaiFENesin  1,200 mg Oral BID   heparin  5,000 Units Subcutaneous Q8H   insulin aspart  0-15 Units Subcutaneous TID WC   insulin aspart  4 Units Subcutaneous TID WC   insulin glargine-yfgn  30 Units Subcutaneous QHS   metroNIDAZOLE  500 mg Oral Q12H   multivitamin  1 tablet Oral QHS   nutrition supplement (JUVEN)  1 packet Oral BID BM   pantoprazole  40 mg Oral Daily   senna-docusate  1 tablet Oral BID   sodium chloride flush  3 mL Intravenous Q12H   sucroferric oxyhydroxide  500 mg Oral TID WC   zinc sulfate  220 mg Oral Daily   Continuous Infusions:  sodium  chloride     cefTRIAXone (ROCEPHIN)  IV 2 g (07/06/22 1353)   magnesium sulfate bolus IVPB     vancomycin 1,000 mg (07/05/22 1557)     LOS: 6 days    Time spent: 35 minutes    Irine Seal, MD [facility]   To contact the attending provider between 7A-7P or the covering provider during after hours 7P-7A, please log into the web site [email or web address] and access using universal [place]  password for that web site. If you do not have the password, please call the hospital operator.  07/07/2022, 11:56 AM

## 2022-07-08 DIAGNOSIS — T8781 Dehiscence of amputation stump: Secondary | ICD-10-CM | POA: Diagnosis not present

## 2022-07-08 DIAGNOSIS — L03115 Cellulitis of right lower limb: Secondary | ICD-10-CM | POA: Diagnosis not present

## 2022-07-08 DIAGNOSIS — L02415 Cutaneous abscess of right lower limb: Secondary | ICD-10-CM | POA: Diagnosis not present

## 2022-07-08 DIAGNOSIS — I5022 Chronic systolic (congestive) heart failure: Secondary | ICD-10-CM | POA: Diagnosis not present

## 2022-07-08 LAB — RENAL FUNCTION PANEL
Albumin: 2.2 g/dL — ABNORMAL LOW (ref 3.5–5.0)
Anion gap: 14 (ref 5–15)
BUN: 89 mg/dL — ABNORMAL HIGH (ref 6–20)
CO2: 22 mmol/L (ref 22–32)
Calcium: 9 mg/dL (ref 8.9–10.3)
Chloride: 92 mmol/L — ABNORMAL LOW (ref 98–111)
Creatinine, Ser: 9.42 mg/dL — ABNORMAL HIGH (ref 0.61–1.24)
GFR, Estimated: 6 mL/min — ABNORMAL LOW (ref >60)
Glucose, Bld: 242 mg/dL — ABNORMAL HIGH (ref 70–99)
Phosphorus: 6.3 mg/dL — ABNORMAL HIGH (ref 2.5–4.6)
Potassium: 3.3 mmol/L — ABNORMAL LOW (ref 3.5–5.1)
Sodium: 128 mmol/L — ABNORMAL LOW (ref 135–145)

## 2022-07-08 LAB — CBC WITH DIFFERENTIAL/PLATELET
Abs Immature Granulocytes: 0 10*3/uL (ref 0.00–0.07)
Basophils Absolute: 0 10*3/uL (ref 0.0–0.1)
Basophils Relative: 0 %
Eosinophils Absolute: 1.9 10*3/uL — ABNORMAL HIGH (ref 0.0–0.5)
Eosinophils Relative: 15 %
HCT: 29.9 % — ABNORMAL LOW (ref 39.0–52.0)
Hemoglobin: 9.5 g/dL — ABNORMAL LOW (ref 13.0–17.0)
Lymphocytes Relative: 12 %
Lymphs Abs: 1.5 10*3/uL (ref 0.7–4.0)
MCH: 24.6 pg — ABNORMAL LOW (ref 26.0–34.0)
MCHC: 31.8 g/dL (ref 30.0–36.0)
MCV: 77.5 fL — ABNORMAL LOW (ref 80.0–100.0)
Monocytes Absolute: 0.9 10*3/uL (ref 0.1–1.0)
Monocytes Relative: 7 %
Neutro Abs: 8.3 10*3/uL — ABNORMAL HIGH (ref 1.7–7.7)
Neutrophils Relative %: 66 %
Platelets: 490 10*3/uL — ABNORMAL HIGH (ref 150–400)
RBC: 3.86 MIL/uL — ABNORMAL LOW (ref 4.22–5.81)
RDW: 22.4 % — ABNORMAL HIGH (ref 11.5–15.5)
WBC: 12.6 10*3/uL — ABNORMAL HIGH (ref 4.0–10.5)
nRBC: 0.4 % — ABNORMAL HIGH (ref 0.0–0.2)
nRBC: 3 /100 WBC — ABNORMAL HIGH

## 2022-07-08 LAB — GLUCOSE, CAPILLARY
Glucose-Capillary: 189 mg/dL — ABNORMAL HIGH (ref 70–99)
Glucose-Capillary: 152 mg/dL — ABNORMAL HIGH (ref 70–99)
Glucose-Capillary: 124 mg/dL — ABNORMAL HIGH (ref 70–99)

## 2022-07-08 MED ORDER — ACETAMINOPHEN 325 MG PO TABS: 325.0000 mg | ORAL_TABLET | Freq: Four times a day (QID) | ORAL | Status: DC | PRN

## 2022-07-08 MED ORDER — INSULIN GLARGINE-YFGN 100 UNIT/ML ~~LOC~~ SOLN
32.0000 [IU] | Freq: Every day | SUBCUTANEOUS | Status: DC
  Filled 2022-07-08: qty 0.32

## 2022-07-08 MED ORDER — OXYCODONE-ACETAMINOPHEN 5-325 MG PO TABS: 1.0000 | ORAL_TABLET | ORAL | 0 refills | Status: DC | PRN

## 2022-07-08 MED ORDER — SENNOSIDES-DOCUSATE SODIUM 8.6-50 MG PO TABS: 1.0000 | ORAL_TABLET | Freq: Every evening | ORAL | Status: DC | PRN

## 2022-07-08 MED ORDER — LOPERAMIDE HCL 2 MG PO CAPS: 2.0000 mg | ORAL_CAPSULE | ORAL | 0 refills | Status: DC | PRN

## 2022-07-08 MED ORDER — DOXYCYCLINE HYCLATE 100 MG PO TABS: 100.0000 mg | ORAL_TABLET | Freq: Two times a day (BID) | ORAL | Status: AC

## 2022-07-08 MED ORDER — INSULIN GLARGINE 100 UNIT/ML ~~LOC~~ SOLN: 32.0000 [IU] | Freq: Every day | SUBCUTANEOUS | 0 refills | Status: DC

## 2022-07-08 MED ORDER — DOXYCYCLINE HYCLATE 100 MG PO TABS: 100.0000 mg | ORAL_TABLET | Freq: Two times a day (BID) | ORAL | Status: DC

## 2022-07-08 MED ORDER — AMOXICILLIN-POT CLAVULANATE 500-125 MG PO TABS: 1.0000 | ORAL_TABLET | Freq: Every day | ORAL | 0 refills | Status: AC

## 2022-07-08 NOTE — Progress Notes (Signed)
Pt to return to snf today. Contacted [facility] to advise clinic of pt's d/c today and that pt will resume care on Thursday.   Melven Sartorius Renal Navigator [phone]

## 2022-07-08 NOTE — TOC Progression Note (Signed)
Transition of Care (TOC) - Progression Note    Patient Details  Name: BERKLEY CRONKRIGHT Sr. MRN: [id number] Date of Birth: Jul 22, 1961  Transition of Care [facility]) CM/SW Contact  Joanne Chars, LCSW Phone Number: 07/08/2022, 3:04 PM  Clinical Narrative:   CSW informed by RN that pt ready for DC back to SNF at [facility].  Pt currently at HD.  Tracy/renal informed and confirms outpt HD is aware of DC.  Message from [facility] that they are able to receive pt today, FL2 sent.  CSW spoke with pt daughter Valinda Party and she was aware of DC and that pt will return to [facility]      Expected Discharge Plan: [place] Barriers to Discharge: Continued Medical Work up  Expected Discharge Plan and Services Expected Discharge Plan: [place] In-house Referral: Clinical Social Work     Living arrangements for the past 2 months: [place] Expected Discharge Date: 07/08/22               DME Arranged: N/A DME Agency: NA                   Social Determinants of Health (SDOH) Interventions    Readmission Risk Interventions     No data to display

## 2022-07-08 NOTE — Inpatient Diabetes Management (Signed)
Inpatient Diabetes Program Recommendations  AACE/ADA: New Consensus Statement on Inpatient Glycemic Control  Target Ranges:  Prepandial:   less than 140 mg/dL      Peak postprandial:   less than 180 mg/dL (1-2 hours)      Critically ill patients:  140 - 180 mg/dL    Latest Reference Range & Units 07/08/22 07:12  Glucose-Capillary 70 - 99 mg/dL 189 (H)    Latest Reference Range & Units 07/07/22 07:31 07/07/22 11:30 07/07/22 16:47 07/07/22 20:50  Glucose-Capillary 70 - 99 mg/dL 206 (H) 154 (H) 175 (H) 246 (H)   Review of Glycemic Control  Diabetes history: DM2 Outpatient Diabetes medications: Lantus 30 units QHS Current orders for Inpatient glycemic control: Semglee 30 units QHS, Novolog 4 units TID with meals, Novolog 0-15 units TID with meals  Inpatient Diabetes Program Recommendations:    Insulin: Noted meal coverage was not given with lunch yesterday (per chart ate 100%). Please consider increasing Semglee to 32 units QHS.   NURSING: Please administer meal coverage insulin if parameters are met (eats at least 50% of meals).  Thanks, Barnie Alderman, RN, MSN, [place] Diabetes Coordinator Inpatient Diabetes Program [phone] (Team Pager from 8am to [place])

## 2022-07-08 NOTE — Progress Notes (Signed)
[facility] Progress Note   Subjective:   Seen in room. Possible D/c today. He reports feeling well, denies SOB, CP, dizziness, abdominal pain and nausea.   Objective Vitals:   07/07/22 1649 07/07/22 2051 07/08/22 0500 07/08/22 0952  BP: 119/66 130/68  119/64  Pulse: 77 79  70  Resp: 17 18  20   Temp: 98 F (36.7 C) 97.9 F (36.6 C)  97.6 F (36.4 C)  TempSrc:    Oral  SpO2: 100% 95%  100%  Weight:   103.4 kg   Height:       Physical Exam General: Alert male in NAD Heart: RRR, no murmurs, rubs or gallops Lungs: CTA bilaterally without wheezing, rhonchi or rales Abdomen: Soft, non-distended, +BS Extremities: R BKA, no edema LLE Dialysis Access: LUE AVF + bruit  Additional Objective Labs: Basic Metabolic Panel: Recent Labs  Lab 07/05/22 0304 07/05/22 0856 07/06/22 1100 07/07/22 0434 07/08/22 0458  NA 128*   < > 129* 131* 128*  K 3.9   < > 3.3* 3.3* 3.3*  CL 89*   < > 89* 92* 92*  CO2 23   < > 26 23 22   GLUCOSE 666*   < > 300* 223* 242*  BUN 47*   < > 49* 64* 89*  CREATININE 7.97*   < > 6.51* 7.87* 9.42*  CALCIUM 9.2   < > 9.2 9.1 9.0  PHOS 3.1  --   --  5.2* 6.3*   < > = values in this interval not displayed.   Liver Function Tests: Recent Labs  Lab 07/01/22 1344 07/02/22 0100 07/05/22 0304 07/07/22 0434 07/08/22 0458  AST 19  --   --   --   --   ALT 17  --   --   --   --   ALKPHOS 174*  --   --   --   --   BILITOT 2.1*  --   --   --   --   PROT 8.3*  --   --   --   --   ALBUMIN 2.6*   < > 2.3* 2.1* 2.2*   < > = values in this interval not displayed.   No results for input(s): "LIPASE", "AMYLASE" in the last 168 hours. CBC: Recent Labs  Lab 07/04/22 0836 07/04/22 1628 07/05/22 0304 07/06/22 1100 07/07/22 0434 07/08/22 0458  WBC 9.3  --  10.7* 15.0* 15.1* 12.6*  NEUTROABS 5.3  --  9.3*  --  9.5* 8.3*  HGB 10.2*   < > 10.6* 10.1* 9.7* 9.5*  HCT 33.9*   < > 34.9* 32.0* 31.9* 29.9*  MCV 79.0*  --  79.3* 77.9* 78.4* 77.5*  PLT  380  --  421* 464* 495* 490*   < > = values in this interval not displayed.   Blood Culture    Component Value Date/Time   SDES  07/02/2022 0100    BLOOD RIGHT FOREARM Performed at [facility], [place] [address], [place], [place] [address]    SPECREQUEST  07/02/2022 0100    BOTTLES DRAWN AEROBIC ONLY Blood Culture adequate volume Performed at [place] [address], [place], [place] [address]    CULT  07/02/2022 0100    NO GROWTH 5 DAYS Performed at [facility] [address] [place], [place] [address]    REPTSTATUS 07/07/2022 FINAL 07/02/2022 0100    Cardiac Enzymes: No results for input(s): "CKTOTAL", "CKMB", "CKMBINDEX", "TROPONINI" in the  last 168 hours. CBG: Recent Labs  Lab 07/07/22 0731 07/07/22 1130 07/07/22 1647 07/07/22 2050 07/08/22 0712  GLUCAP 206* 154* 175* 246* 189*   Iron Studies: No results for input(s): "IRON", "TIBC", "TRANSFERRIN", "FERRITIN" in the last 72 hours. @lablastinr3 @ Studies/Results: No results found. Medications:  sodium chloride     cefTRIAXone (ROCEPHIN)  IV 2 g (07/07/22 1540)   magnesium sulfate bolus IVPB     vancomycin 1,000 mg (07/05/22 1557)    (feeding supplement) PROSource Plus  30 mL Oral BID BM   vitamin C  1,000 mg Oral Daily   aspirin EC  81 mg Oral Daily   benzonatate  100 mg Oral TID   Chlorhexidine Gluconate Cloth  6 each Topical Q0600   Chlorhexidine Gluconate Cloth  6 each Topical Q0600   Chlorhexidine Gluconate Cloth  6 each Topical Q0600   docusate sodium  100 mg Oral Daily   feeding supplement (NEPRO CARB STEADY)  237 mL Oral BID BM   furosemide  40 mg Oral Daily   guaiFENesin  1,200 mg Oral BID   heparin  5,000 Units Subcutaneous Q8H   insulin aspart  0-15 Units Subcutaneous TID WC   insulin aspart  4 Units Subcutaneous TID WC   insulin glargine-yfgn  32 Units Subcutaneous QHS   metroNIDAZOLE  500 mg Oral Q12H   multivitamin  1 tablet Oral QHS    mupirocin ointment  1 Application Nasal BID   nutrition supplement (JUVEN)  1 packet Oral BID BM   pantoprazole  40 mg Oral Daily   senna-docusate  1 tablet Oral BID   sodium chloride flush  3 mL Intravenous Q12H   sucroferric oxyhydroxide  500 mg Oral TID WC   zinc sulfate  220 mg Oral Daily    Dialysis Orders: [facility] [place] [facility] TTS. 4hrs 63min. F180. Flow rates: 450/500. EDW 109kg. 2K/2.25Cal. AVF.    Meds: Heparin 6000 unit bolus; Mircera 245mcg q2weeks (last dose 8/12); Venofer 50mg  qweekly  Assessment/Plan: 1. Wound infection/cellulitis/abscess - s/p stump revision by Dr. Sharol Given.  On ABX.  2. ESRD - on HD TTS.  Next HD 07/08/22. Holding heparin post surgery. 3. Anemia of CKD-  Last Hgb 9.7. ESA due next week.  4. Secondary hyperparathyroidism - Corrected calcium elevated. Will decrease calcitriol dose. Phos elevated, continue binders.  5. HTN/volume - Blood pressure well controlled. Continue home meds. Does not appear volume overloaded on exam but Na is low, increased UFgoal as tolerated. Will need new EDW at discharge.  6. Nutrition - Heart healthy diet. Alb low - getting protein supplements.  Follow labs closely, if K^, change back to renal diet.  7. Dispo - will need skilled nursing on d/c. 8. Diabetes - Insulin per PMD. 9. Diarrhea - ongoing.  C diff neg/GI panel negative. 10. Hypokalemia - has prn supplements ordered.  Liberalized diet. 4K bath not available, using 3K  Anice Paganini, PA-C 07/08/2022, 10:49 AM  [facility] Pager: [phone]

## 2022-07-08 NOTE — Progress Notes (Signed)
DISCHARGE NOTE [name] to be discharged Skilled nursing facility per MD order. Patient verbalized understanding.  Skin clean, dry and intact without evidence of skin break down, no evidence of skin tears noted. IV catheter discontinued intact. Site without signs and symptoms of complications. Dressing and pressure applied. Pt denies pain at the site currently. No complaints noted.  Patient free of lines, drains, and wounds.   Discharge packet assembled. An After Visit Summary (AVS) was printed and given to the EMS personnel. Patient escorted via stretcher and discharged to [facility] via ambulance. Report called to accepting facility; all questions and concerns addressed.   Report called and given to Brantley Fling, RN

## 2022-07-08 NOTE — Progress Notes (Signed)
Physical Therapy Treatment Patient Details Name: [name] FIORELLO Sr. MRN: [id number] DOB: 1961-03-10 Today's Date: 07/08/2022   History of Present Illness 61 y/o presents 07/01/22 for evaluation from the nursing facility of redness and drainage from his amputation site concerning for possible infection.  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  PMH- hypertension, cardiomyopathy, CHF, diabetes, renal failure dialysis, R BKA    PT Comments    Patient eager to attempt OOB. Utilized stedy with pt unable to achieve full standing with arms and LLE fully extended despite 4 tries. Returned to bed and completed seated and supine exercises.    Recommendations for follow up therapy are one component of a multi-disciplinary discharge planning process, led by the attending physician.  Recommendations may be updated based on patient status, additional functional criteria and insurance authorization.  Follow Up Recommendations  Skilled nursing-short term rehab (<3 hours/day) Can patient physically be transported by private vehicle: No   Assistance Recommended at Discharge Intermittent Supervision/Assistance  Patient can return home with the following Two people to help with walking and/or transfers;Assistance with cooking/housework;Direct supervision/assist for medications management;Direct supervision/assist for financial management;Assist for transportation;Help with stairs or ramp for entrance   Equipment Recommendations  None recommended by PT    Recommendations for Other Services       Precautions / Restrictions Precautions Precautions: Fall Restrictions Weight Bearing Restrictions: No     Mobility  Bed Mobility Overal bed mobility: Needs Assistance Bed Mobility: Rolling, Sidelying to Sit, Sit to Supine Rolling: Modified independent (Device/Increase time) Sidelying to sit: Modified independent (Device/Increase time)   Sit to supine:  Modified independent (Device/Increase time)   General bed mobility comments: no dizziness; +use of rail    Transfers Overall transfer level: Needs assistance   Transfers: Sit to/from Stand, Bed to chair/wheelchair/BSC Sit to Stand: +2 physical assistance, From elevated surface, Mod assist           General transfer comment: attempted standing x 4 from elevated EOB with stedy--successful x 2; incr time trying to come to full standing with both elbows and LLE still flexed at highest point Transfer via Lift Equipment: Stedy  Ambulation/Gait                   Stairs             Wheelchair Mobility    Modified Rankin (Stroke Patients Only)       Balance Overall balance assessment: Needs assistance Sitting-balance support: No upper extremity supported, Feet supported Sitting balance-Leahy Scale: Fair Sitting balance - Comments: sat at EOB for exercises x 7 minutes   Standing balance support: Bilateral upper extremity supported, Reliant on assistive device for balance Standing balance-Leahy Scale: Poor                              Cognition Arousal/Alertness: Awake/alert Behavior During Therapy: Flat affect Overall Cognitive Status: No family/caregiver present to determine baseline cognitive functioning                                 General Comments: slow processing and slow to verbally respond        Exercises Amputee Exercises Knee Extension: Left, Other reps (comment), Seated (20 reps x 5 sets) Other Exercises Other Exercises: LLE bridging x 6 reps with inability to fully lift  left hip off bed    General Comments        Pertinent Vitals/Pain Pain Assessment Pain Assessment: 0-10 Pain Score: 6  Pain Location: Rt BKA Pain Descriptors / Indicators: Discomfort, Sore Pain Intervention(s): Limited activity within patient's tolerance, Monitored during session, Repositioned    Home Living                           Prior Function            PT Goals (current goals can now be found in the care plan section) Acute Rehab PT Goals Patient Stated Goal: get back to walking Time For Goal Achievement: 07/19/22 Potential to Achieve Goals: Good Progress towards PT goals: Progressing toward goals    Frequency    Min 3X/week      PT Plan Current plan remains appropriate    Co-evaluation              AM-PAC PT "6 Clicks" Mobility   Outcome Measure  Help needed turning from your back to your side while in a flat bed without using bedrails?: None Help needed moving from lying on your back to sitting on the side of a flat bed without using bedrails?: None Help needed moving to and from a bed to a chair (including a wheelchair)?: Total Help needed standing up from a chair using your arms (e.g., wheelchair or bedside chair)?: Total Help needed to walk in hospital room?: Total Help needed climbing 3-5 steps with a railing? : Total 6 Click Score: 12    End of Session Equipment Utilized During Treatment: Gait belt Activity Tolerance: Patient tolerated treatment well Patient left: in bed;with call bell/phone within reach;with bed alarm set Nurse Communication: Mobility status;Other (comment) (ok to set alarm to highest level to allow pt to sit EOB on his own; RN agreed) PT Visit Diagnosis: Unsteadiness on feet (R26.81);Other abnormalities of gait and mobility (R26.89);Muscle weakness (generalized) (M62.81)     Time: 2248-2500 PT Time Calculation (min) (ACUTE ONLY): 34 min  Charges:  $Gait Training: 8-22 mins $Therapeutic Exercise: 8-22 mins                      Arby Barrette, PT Acute Rehabilitation Services  Office [phone]    Rexanne Mano 07/08/2022, 8:50 AM

## 2022-07-08 NOTE — Progress Notes (Signed)
Received patient in bed to unit.  Alert and oriented.  Informed consent signed and in chart.   Treatment initiated: 1240 Treatment completed: 1649  Patient tolerated well.  Transported back to the room  Alert, without acute distress.  Hand-off given to patient's nurse.   Access used: AVF Access issues: no  Total UF removed: 3000 Medication(s) given: tylenol and vancomycin Post HD VS: 98.7, 78, 19, 115/72, 100% Post HD weight: 102.8kg   Pt c/o of pain post treatment and requested for tylenol. Tylenol given at 1320 unable to give medication at this time. Access has bruit and thrill. Dressing c/d/I. No s/s of distress upon d/c.   [facility]

## 2022-07-08 NOTE — Discharge Summary (Signed)
Physician Discharge Summary  [name] [name] Sr. FGH:[id number] DOB: 08-30-1961 DOA: 07/01/2022  PCP: Pcp, No  Admit date: 07/01/2022 Discharge date: 07/08/2022  Time spent: 60 minutes  Recommendations for Outpatient Follow-up:  Patient was discharged to skilled nursing facility with wound VAC.  Follow-up with Dr. Sharol Given, orthopedics in 1 week. Follow-up with Dr. Gale Journey, infectious disease on 07/29/2022 at 10:45 AM. Follow-up with MD at skilled nursing facility. For up at regular hemodialysis center on 07/10/2022.   Discharge Diagnoses:  Principal Problem:   Cellulitis of right leg Active Problems:   Abscess of right lower leg   Dehiscence of amputation stump of right lower extremity (HCC)   DM2 (diabetes mellitus, type 2) (HCC)   ESRD (end stage renal disease) (HCC)   HTN (hypertension)   Chronic systolic CHF (congestive heart failure) (HCC)   GERD (gastroesophageal reflux disease)   Pressure injury of skin   Diarrhea   Hyperosmolar hyperglycemic state (HHS) ([place])   Discharge Condition: Stable and improved  Diet recommendation: Heart healthy  Filed Weights   07/05/22 0845 07/08/22 0500 07/08/22 1213  Weight: 105.2 kg 103.4 kg 105.4 kg    History of present illness:  HPI per Dr. Linda Hedges Mr. [name] [name], a 61 y/o with a history of hypertension, cardiomyopathy, CHF, diabetes, renal failure dialysis.  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  Patient presents today for evaluation from the nursing facility of redness and drainage from his amputation site concerning for possible infection.  Patient had a temperature this morning.  He was scheduled to go to dialysis but did not go with the infection symptoms and was sent to the ED instead.  Patient denies any severe pain.  He is not aware that he has been having fevers or chills.  He denies any vomiting or diarrhea.      ED Course: Tmaz 100.9 - defervesced to 98.2  BP stable. Patient in no pain  and no distress. Lab: WBC 13.3 with nl differential. Glucose 56. CXR with cardiomegaly and pulmonary congestion. In ED patient administered Vancomycin/Rocephin/ flagyl. Consult placed to nephrology. TRH called to admit for continued management.   Hospital Course:  #1 status post right BKA with amputation stump infection/wound dehiscence/Abscess right BKA -Patient status post right BKA 05/24/19/2023, sent from SNF with erythema, drainage, fever concerning for abscess versus cellulitis. -Patient seen in consultation by orthopedics, Dr. Sharol Given who discussed with patient and patient would not consider an amputation above the knee at this time and patient underwent revision of right transtibial amputation in the OR 07/04/2022 with abscess noted. Ortho form wound VAC placed. -Patient maintained on IV vancomycin IV Rocephin, IV Flagyl during the hospitalization.  -ID was consulted who assessed the patient and recommended on discharge patient may be changed to doxycycline and Augmentin to complete a 2-week course of antibiotic treatment from 07/04/2022 with outpatient follow-up in the ID clinic on 07/29/2022 at 10:45 AM.   -Patient remained afebrile, improved clinically and be discharged in stable and improved condition to skilled nursing facility.   -Outpatient follow-up with orthopedics and ID.    2.  End-stage renal disease HD-TTS -Patient noted to have missed dialysis on presentation in the ED. -Nephrology saw the patient during the hospitalization and patient received hemodialysis during the hospitalization.   -Follow-up at regular hemodialysis unit on Thursday, 07/10/2022.     3.  Diarrhea -Patient multiple loose stools noted early on the hospitalization which has improved. -C. difficile  PCR negative, GI pathogen panel negative. -Patient noted to have been on laxatives prior to admission which were held during the hospitalization. -Discontinued enteric precautions. -Diarrhea improved. -Imodium as  needed.   4.  Poorly controlled type 2 diabetes mellitus/hyperglycemia/hyperosmolar hyperglycemia -Hemoglobin A1c 8.2 on 05/24/2022. -Patient noted earlier in the hospitalization to have low blood glucose levels of 51. -Lantus dose decreased to 25 units daily, SSI. -Patient noted to have blood glucose level of > 600 the a.m. of 07/05/2022., stat BMET of 648.   -Hyperglycemic hyperosmolar state likely secondary to acute infection in the setting of diet noncompliance.   -Patient received total of 45 units of NovoLog with improvement with blood glucose levels and as such insulin drip discontinued.   -Patient maintained on Lantus/Semglee 30 units daily and uptitrated to 32 units daily during the hospitalization.  Patient also maintained on meal coverage NovoLog 4 units 3 times daily with meals.   -Patient was followed by diabetic coordinator during the hospitalization.   -Patient will be discharged in stable and improved condition.    5.  Chronic systolic CHF/hypertension -Stable. -Remained euvolemic during the hospitalization.  Patient maintained on home regimen Norvasc and aspirin.   -Volume management per HD.    6.  History of CAD/MI -Patient maintained on aspirin.   7.  GERD -Patient maintained on a PPI.     8.  Hypomagnesemia/hypokalemia -Likely secondary to GI losses. -Electrolytes repleted per nephrology in hemodialysis.   -Outpatient follow-up.    9.  Stage III left medial pressure injury buttocks, POA Pressure Injury 07/02/22 Buttocks Left;Medial Stage 3 -  Full thickness tissue loss. Subcutaneous fat may be visible but bone, tendon or muscle are NOT exposed. (Active)  07/02/22 0058  Location: Buttocks  Location Orientation: Left;Medial  Staging: Stage 3 -  Full thickness tissue loss. Subcutaneous fat may be visible but bone, tendon or muscle are NOT exposed.  Wound Description (Comments):   Present on Admission: Yes       Procedures: Chest x-ray 07/01/2022 Plain films of  the right knee 07/01/2022 Transfusion 1 unit packed red blood cells 07/04/2022 preop Revision amputation below the knee per Dr. Sharol Given orthopedics 07/04/2022  Consultations: Orthopedics: Dr. Sharol Given 07/03/2022 Nephrology: Dr. Candiss Norse 07/01/2022 ID: Dr. Gale Journey 07/04/2022    Discharge Exam: Vitals:   07/08/22 1213 07/08/22 1240  BP: 135/69 129/71  Pulse: 70 70  Resp: 19 19  Temp: 98.4 F (36.9 C)   SpO2: 97% 100%    General: NAD Cardiovascular: RRR no murmurs rubs or gallops.  No JVD.  No lower extremity edema. Respiratory: Clear to auscultation bilaterally.  No wheezes, no crackles, no rhonchi.  Discharge Instructions   Discharge Instructions     Diet - low sodium heart healthy   Complete by: As directed    Discharge wound care:   Complete by: As directed    As noted above. Wound care  Daily at 5am      Comments: Cut 2 narrow strips of Aquacel Kellie Simmering # [phone]) and tuck into 2 wounds to right stump Q day, using swab to fill, then cover with ABD pads, kerlex, and ace wrap.   Increase activity slowly   Complete by: As directed       Allergies as of 07/08/2022       Reactions   Sulfa Antibiotics Nausea And Vomiting, Other (See Comments)   "Allergic," per [facility]        Medication List     TAKE these medications  Nepro Liqd Take by mouth 2 (two) times daily.   (feeding supplement) PROSource Plus liquid Take 30 mLs by mouth 2 (two) times daily between meals.   feeding supplement (NEPRO CARB STEADY) Liqd Take 237 mLs by mouth 2 (two) times daily between meals.   acetaminophen 325 MG tablet Commonly known as: TYLENOL Take 1-2 tablets (325-650 mg total) by mouth every 6 (six) hours as needed for mild pain (pain score 1-3 or temp > 100.5).   amLODipine 5 MG tablet Commonly known as: NORVASC Take 5 mg by mouth in the morning.   amoxicillin-clavulanate 500-125 MG tablet Commonly known as: Augmentin Take 1 tablet (500 mg total) by mouth daily for 10 days.   aspirin EC 81  MG tablet Take 1 tablet (81 mg total) by mouth daily. Swallow whole.   benzonatate 200 MG capsule Commonly known as: TESSALON Take 200 mg by mouth See admin instructions. Take 200 mg by mouth at 8 AM, 2 PM, and 8 PM on Sun/Mon/Wed/Fri and 200 mg at 8 AM and 5 PM every Tues/Thurs/Sat   doxycycline 100 MG tablet Commonly known as: VIBRA-TABS Take 1 tablet (100 mg total) by mouth every 12 (twelve) hours for 10 days.   ethyl chloride spray Apply 1 Application topically Every Tuesday,Thursday,and Saturday with dialysis.   famotidine 20 MG tablet Commonly known as: PEPCID Take 20 mg by mouth at bedtime.   feeding supplement (PRO-STAT SUGAR FREE 64) Liqd Take 30 mLs by mouth 2 (two) times daily.   Pro-Stat AWC Liqd Take 30 mLs by mouth 2 (two) times daily.   guaiFENesin 100 MG/5ML liquid Commonly known as: ROBITUSSIN Take 10 mLs by mouth every 4 (four) hours as needed for cough. What changed: Another medication with the same name was removed. Continue taking this medication, and follow the directions you see here.   insulin glargine 100 UNIT/ML injection Commonly known as: LANTUS Inject 0.32 mLs (32 Units total) into the skin at bedtime. What changed: how much to take   ipratropium-albuterol 0.5-2.5 (3) MG/3ML Soln Commonly known as: DUONEB Take 3 mLs by nebulization every 4 (four) hours as needed (for wheezing or shortness of breath).   loperamide 2 MG capsule Commonly known as: IMODIUM Take 1 capsule (2 mg total) by mouth as needed for diarrhea or loose stools.   multivitamin Tabs tablet Take 1 tablet by mouth at bedtime.   ondansetron 4 MG tablet Commonly known as: ZOFRAN Take 4 mg by mouth every 6 (six) hours as needed for nausea or vomiting.   oxyCODONE-acetaminophen 5-325 MG tablet Commonly known as: Percocet Take 1 tablet by mouth every 4 (four) hours as needed for severe pain.   pantoprazole 40 MG tablet Commonly known as: PROTONIX Take 1 tablet (40 mg total)  by mouth daily at 12 noon. What changed: when to take this   senna-docusate 8.6-50 MG tablet Commonly known as: Senokot-S Take 1 tablet by mouth at bedtime as needed for mild constipation. What changed:  when to take this reasons to take this   Velphoro 500 MG chewable tablet Generic drug: sucroferric oxyhydroxide Chew 1,000 mg by mouth See admin instructions. Chew 1,000 mg by mouth at 9 AM and 5 PM on daily and an additional 1,000 mg at noon only on Mon/Wed/Fri   sucroferric oxyhydroxide 500 MG chewable tablet Commonly known as: VELPHORO Chew 1,000 mg by mouth See admin instructions. Chew 1,000 mg by mouth one time a day every Mon, Wed, Fri, Sun with lunch  Discharge Care Instructions  (From admission, onward)           Start     Ordered   07/08/22 0000  Discharge wound care:       Comments: As noted above. Wound care  Daily at 5am      Comments: Cut 2 narrow strips of Aquacel Kellie Simmering # [phone]) and tuck into 2 wounds to right stump Q day, using swab to fill, then cover with ABD pads, kerlex, and ace wrap.   07/08/22 1249           Allergies  Allergen Reactions   Sulfa Antibiotics Nausea And Vomiting and Other (See Comments)    "Allergic," per [facility]    Follow-up Information     [name], MD Follow up in 1 week(s).   Specialty: Orthopedic Surgery Contact information: [place] [address] [phone]         MD AT SNF Follow up.          Jabier Mutton, MD Follow up on 07/29/2022.   Specialty: Infectious Diseases Why: F/U AT 1045AM Contact information: [address] [place] [address] [phone]         Hemodialysis unit Follow up on 07/10/2022.   Why: Follow-up at regular hemodialysis unit on 07/10/2022.                 The results of significant diagnostics from this hospitalization (including imaging, microbiology, ancillary and laboratory) are listed below for reference.     Significant Diagnostic Studies: DG Knee Complete 4 Views Right  Result Date: 07/01/2022 CLINICAL DATA:  Possible infection after amputation. EXAM: RIGHT KNEE - COMPLETE 4+ VIEW COMPARISON:  None Available. FINDINGS: Status post right below-knee amputation. 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 Vascular calcifications are noted. No fracture or dislocation is noted. Expected postoperative changes are seen in the soft tissues of the stump. IMPRESSION: Expected postsurgical changes status post right below-knee amputation. Electronically Signed   By: Marijo Conception M.D.   On: 07/01/2022 11:01   DG Chest Port 1 View  Result Date: 07/01/2022 CLINICAL DATA:  Recent amputation of right lower extremity. Missed dialysis today. EXAM: PORTABLE CHEST 1 VIEW COMPARISON:  02/03/2022 FINDINGS: Aortic atherosclerosis. Stable cardiac enlargement. Lung volumes are low with asymmetric elevation of the right hemidiaphragm. Pulmonary vascular congestion. No signs of pleural effusion or interstitial edema. No airspace opacities. IMPRESSION: 1. Stable cardiac enlargement. 2. Pulmonary vascular congestion. 3. Low lung volumes. Electronically Signed   By: Kerby Moors M.D.   On: 07/01/2022 11:01    Microbiology: Recent Results (from the past 240 hour(s))  Urine Culture     Status: None   Collection Time: 07/01/22 11:44 AM   Specimen: In/Out Cath Urine  Result Value Ref Range Status   Specimen Description   Final    IN/OUT CATH URINE Performed at [place] [facility] [address] [place], [place] [address]    Special Requests   Final    NONE Performed at [facility], [place] [address], [place], [place] [address]    Culture   Final    NO GROWTH Performed at [place] [facility], [place] [address] [place],  [address]    Report Status 07/04/2022 FINAL  Final  SARS Coronavirus 2 by RT PCR (hospital order, performed in [facility] hospital lab) *cepheid single  result test* Anterior Nasal Swab     Status: None  Collection Time: 07/01/22 11:47 AM   Specimen: Anterior Nasal Swab  Result Value Ref Range Status   SARS Coronavirus 2 by RT PCR NEGATIVE NEGATIVE Final    Comment: (NOTE) SARS-CoV-2 target nucleic acids are NOT DETECTED.  The SARS-CoV-2 RNA is generally detectable in upper and lower respiratory specimens during the acute phase of infection. The lowest concentration of SARS-CoV-2 viral copies this assay can detect is 250 copies / mL. A negative result does not preclude SARS-CoV-2 infection and should not be used as the sole basis for treatment or other patient management decisions.  A negative result may occur with improper specimen collection / handling, submission of specimen other than nasopharyngeal swab, presence of viral mutation(s) within the areas targeted by this assay, and inadequate number of viral copies (<250 copies / mL). A negative result must be combined with clinical observations, patient history, and epidemiological information.  Fact Sheet for Patients:   https://www.patel.info/  Fact Sheet for Healthcare Providers: https://hall.com/  This test is not yet approved or  cleared by the [place] FDA and has been authorized for detection and/or diagnosis of SARS-CoV-2 by FDA under an Emergency Use Authorization (EUA).  This EUA will remain in effect (meaning this test can be used) for the duration of the COVID-19 declaration under Section 564(b)(1) of the Act, 21 U.S.C. section 360bbb-3(b)(1), unless the authorization is terminated or revoked sooner.  Performed at [facility], [place] [address] [place], [place] [address]   Blood Culture (routine x 2)     Status: None   Collection Time: 07/01/22  1:44 PM   Specimen: BLOOD  Result Value Ref Range Status   Specimen Description   Final    BLOOD BLOOD RIGHT FOREARM Performed at [place] [address], [place], [place] [address]    Special Requests   Final    BOTTLES DRAWN AEROBIC AND ANAEROBIC Blood Culture results may not be optimal due to an excessive volume of blood received in culture bottles Performed at [place] [address], [place], [place] [address]    Culture   Final    NO GROWTH 5 DAYS Performed at [facility], [place] [address] [place], [place] [address]    Report Status 07/06/2022 FINAL  Final  Blood Culture (routine x 2)     Status: None   Collection Time: 07/02/22  1:00 AM   Specimen: BLOOD RIGHT FOREARM  Result Value Ref Range Status   Specimen Description   Final    BLOOD RIGHT FOREARM Performed at [place] [address] [place], [place] [address]    Special Requests   Final    BOTTLES DRAWN AEROBIC ONLY Blood Culture adequate volume Performed at [facility] [address] [place], [place] [address]    Culture   Final    NO GROWTH 5 DAYS Performed at [facility], [place] [address] [place], [place] [address]    Report Status 07/07/2022 FINAL  Final  MRSA Next Gen by PCR, Nasal     Status: Abnormal   Collection Time: 07/02/22  6:51 AM   Specimen: Nasal Mucosa; Nasal Swab  Result Value Ref Range Status   MRSA by PCR Next Gen DETECTED (A) NOT DETECTED Final    Comment: (NOTE) The GeneXpert MRSA Assay (FDA approved for NASAL specimens only), is one component of a comprehensive MRSA colonization surveillance program. It is not intended to diagnose MRSA infection  nor to guide or monitor treatment for MRSA infections. Test performance is not FDA approved in patients less than 54 years old. Performed at [facility], [place] [address], [place], [place] [address]   C Difficile Quick Screen w PCR reflex     Status: None   Collection Time: 07/02/22 11:27 PM   Specimen: STOOL  Result Value Ref Range Status   C Diff antigen NEGATIVE  NEGATIVE Final   C Diff toxin NEGATIVE NEGATIVE Final   C Diff interpretation No C. difficile detected.  Final    Comment: Performed at [facility], [place] [address], [place], [place] [address]  Gastrointestinal Panel by PCR , Stool     Status: None   Collection Time: 07/02/22 11:27 PM   Specimen: STOOL  Result Value Ref Range Status   Campylobacter species NOT DETECTED NOT DETECTED Final   Plesimonas shigelloides NOT DETECTED NOT DETECTED Final   Salmonella species NOT DETECTED NOT DETECTED Final   Yersinia enterocolitica NOT DETECTED NOT DETECTED Final   Vibrio species NOT DETECTED NOT DETECTED Final   Vibrio cholerae NOT DETECTED NOT DETECTED Final   Enteroaggregative E coli (EAEC) NOT DETECTED NOT DETECTED Final   Enteropathogenic E coli (EPEC) NOT DETECTED NOT DETECTED Final   Enterotoxigenic E coli (ETEC) NOT DETECTED NOT DETECTED Final   Shiga like toxin producing E coli (STEC) NOT DETECTED NOT DETECTED Final   Shigella/Enteroinvasive E coli (EIEC) NOT DETECTED NOT DETECTED Final   Cryptosporidium NOT DETECTED NOT DETECTED Final   Cyclospora cayetanensis NOT DETECTED NOT DETECTED Final   Entamoeba histolytica NOT DETECTED NOT DETECTED Final   Giardia lamblia NOT DETECTED NOT DETECTED Final   Adenovirus F40/41 NOT DETECTED NOT DETECTED Final   Astrovirus NOT DETECTED NOT DETECTED Final   Norovirus GI/GII NOT DETECTED NOT DETECTED Final   Rotavirus A NOT DETECTED NOT DETECTED Final   Sapovirus (I, II, IV, and V) NOT DETECTED NOT DETECTED Final    Comment: Performed at [facility], [place] [place], [place] [address]     Labs: Basic Metabolic Panel: Recent Labs  Lab 07/02/22 0100 07/03/22 0322 07/04/22 0836 07/05/22 0304 07/05/22 0856 07/05/22 2229 07/06/22 0156 07/06/22 1100 07/07/22 0434 07/08/22 0458  NA 135  135   < > 132* 128*   < > 132* 132* 129* 131* 128*  K 3.6  3.6   < > 3.5 3.9   < > 3.1* 2.9* 3.3* 3.3* 3.3*  CL 90*   90*   < > 92* 89*   < > 92* 90* 89* 92* 92*  CO2 29  29   < > 27 23   < > 27 29 26 23 22   GLUCOSE 82  81   < > 227* 666*   < > 239* 265* 300* 223* 242*  BUN 42*  40*   < > 28* 47*   < > 32* 36* 49* 64* 89*  CREATININE 7.34*  7.26*   < > 6.46* 7.97*   < > 5.61* 5.99* 6.51* 7.87* 9.42*  CALCIUM 9.4  9.4   < > 9.1 9.2   < > 9.2 9.3 9.2 9.1 9.0  MG  --   --  1.6* 2.2  --   --   --  1.8  --   --   PHOS 3.4  --   --  3.1  --   --   --   --  5.2* 6.3*   < > =  values in this interval not displayed.   Liver Function Tests: Recent Labs  Lab 07/01/22 1344 07/02/22 0100 07/05/22 0304 07/07/22 0434 07/08/22 0458  AST 19  --   --   --   --   ALT 17  --   --   --   --   ALKPHOS 174*  --   --   --   --   BILITOT 2.1*  --   --   --   --   PROT 8.3*  --   --   --   --   ALBUMIN 2.6* 3.1* 2.3* 2.1* 2.2*   No results for input(s): "LIPASE", "AMYLASE" in the last 168 hours. No results for input(s): "AMMONIA" in the last 168 hours. CBC: Recent Labs  Lab 07/03/22 0322 07/04/22 0836 07/04/22 1628 07/05/22 0304 07/06/22 1100 07/07/22 0434 07/08/22 0458  WBC 10.0 9.3  --  10.7* 15.0* 15.1* 12.6*  NEUTROABS 6.3 5.3  --  9.3*  --  9.5* 8.3*  HGB 9.3* 10.2* 11.1* 10.6* 10.1* 9.7* 9.5*  HCT 31.8* 33.9* 35.5* 34.9* 32.0* 31.9* 29.9*  MCV 81.1 79.0*  --  79.3* 77.9* 78.4* 77.5*  PLT 291 380  --  421* 464* 495* 490*   Cardiac Enzymes: No results for input(s): "CKTOTAL", "CKMB", "CKMBINDEX", "TROPONINI" in the last 168 hours. BNP: BNP (last 3 results) Recent Labs    07/01/22 1344  BNP 1,670.9*    ProBNP (last 3 results) No results for input(s): "PROBNP" in the last 8760 hours.  CBG: Recent Labs  Lab 07/07/22 0731 07/07/22 1130 07/07/22 1647 07/07/22 2050 07/08/22 0712  GLUCAP 206* 154* 175* 246* 189*       Signed:  Irine Seal MD.  [facility] 07/08/2022, 1:00 PM

## 2022-07-08 NOTE — NC FL2 (Signed)
[place] LEVEL OF CARE SCREENING TOOL     IDENTIFICATION  Patient Name: [name] THRUN Sr. Birthdate: [birth year] Sex: male Admission Date (Current Location): 07/01/2022  [place] and [place] Number:  [name] [name] [id number] [place] and Address:  [facility] [place]. [facility], [place] [address], [place], [place] [address]      Provider Number: [id number]  Attending Physician Name and Address:  Eugenie Filler, MD  Relative Name and Phone Number:  [name] [name] [phone]    Current Level of Care: Hospital Recommended Level of Care: [place] Prior Approval Number:    Date Approved/Denied:   PASRR Number: [id number]  Discharge Plan: SNF    Current Diagnoses: Patient Active Problem List   Diagnosis Date Noted   Hyperosmolar hyperglycemic state (HHS) ([place])    Diarrhea    Dehiscence of amputation stump of right lower extremity (HCC)    Abscess of right lower leg    Pressure injury of skin 07/02/2022   Cellulitis of right leg 07/01/2022   CAD (coronary artery disease) 05/24/2022   Leukocytosis 05/24/2022   Below-knee amputation of right lower extremity ([place]) 05/23/2022   Obesity (BMI 30-39.9) 02/19/2022   History of CVA  02/05/2022   Carotid stenosis 02/05/2022   acute on chronic Anemia of chronic renal failure  02/03/2022   Subacute osteomyelitis, right ankle and foot ([place]) 21/22/4825   Acute metabolic encephalopathy 00/37/0488   Protein-calorie malnutrition, mild (HCC) 02/02/2022   GERD (gastroesophageal reflux disease) 89/16/9450   Chronic systolic CHF (congestive heart failure) ([place]) 08/14/2019   LBBB (left bundle branch block) 08/13/2019   Problem with vascular access 08/13/2019   Dialysis AV fistula malfunction ([place]) 08/12/2019   ESRD (end stage renal disease) ([place]) 09/20/2018   DM2 (diabetes mellitus, type 2) ([place]) 09/20/2018   HTN (hypertension) 09/20/2018    Orientation RESPIRATION BLADDER Height & Weight      Self, Time, Situation, Place  Normal Continent Weight: 232 lb 5.8 oz (105.4 kg) Height:  6\' 1"  (185.4 cm)  BEHAVIORAL SYMPTOMS/MOOD NEUROLOGICAL BOWEL NUTRITION STATUS      Continent Diet (see discharge summary)  AMBULATORY STATUS COMMUNICATION OF NEEDS Skin   Total Care Verbally Other (Comment) (redness)                       Personal Care Assistance Level of Assistance  Bathing, Feeding, Dressing Bathing Assistance: Limited assistance Feeding assistance: Independent Dressing Assistance: Limited assistance     Functional Limitations Info  Sight, Hearing, Speech Sight Info: Adequate Hearing Info: Adequate Speech Info: Adequate    SPECIAL CARE FACTORS FREQUENCY  PT (By licensed PT), OT (By licensed OT)     PT Frequency: 5x week OT Frequency: 5x week            Contractures Contractures Info: Not present    Additional Factors Info  Code Status, Allergies, Insulin Sliding Scale Code Status Info: full Allergies Info: sulfa antibiotics   Insulin Sliding Scale Info: Novolog: see discharge summary       Current Medications (07/08/2022):  This is the current hospital active medication list Current Facility-Administered Medications  Medication Dose Route Frequency Provider Last Rate Last Admin   (feeding supplement) PROSource Plus liquid 30 mL  30 mL Oral BID BM [name], MD   30 mL at 07/07/22 1742   0.9 %  sodium chloride infusion  250 mL Intravenous PRN [name], MD       acetaminophen (TYLENOL) tablet  650 mg  650 mg Oral Q6H PRN [name], MD   650 mg at 07/08/22 1320   Or   acetaminophen (TYLENOL) suppository 650 mg  650 mg Rectal Q6H PRN [name], MD       acetaminophen (TYLENOL) tablet 325-650 mg  325-650 mg Oral Q6H PRN [name], MD       alum & mag hydroxide-simeth (MAALOX/MYLANTA) 200-200-20 MG/5ML suspension 15-30 mL  15-30 mL Oral Q2H PRN [name], MD       ascorbic acid (VITAMIN C) tablet 1,000 mg  1,000 mg Oral Daily  [name], MD   1,000 mg at 07/07/22 4827   aspirin EC tablet 81 mg  81 mg Oral Daily [name], MD   81 mg at 07/07/22 0786   benzonatate (TESSALON) capsule 100 mg  100 mg Oral TID [name], MD   100 mg at 07/08/22 1038   bisacodyl (DULCOLAX) EC tablet 5 mg  5 mg Oral Daily PRN [name], MD       cefTRIAXone (ROCEPHIN) 2 g in sodium chloride 0.9 % 100 mL IVPB  2 g Intravenous Q24H Pham, Minh Q, RPH-CPP 200 mL/hr at 07/07/22 1540 2 g at 07/07/22 1540   Chlorhexidine Gluconate Cloth 2 % PADS 6 each  6 each Topical Q0600 [name], MD   6 each at 07/08/22 7544   Chlorhexidine Gluconate Cloth 2 % PADS 6 each  6 each Topical Q0600 Janalee Dane, PA-C   6 each at 07/08/22 9201   Chlorhexidine Gluconate Cloth 2 % PADS 6 each  6 each Topical Q0600 Eugenie Filler, MD   6 each at 07/08/22 0627   dextrose 50 % solution 0-50 mL  0-50 mL Intravenous PRN Eugenie Filler, MD       docusate sodium (COLACE) capsule 100 mg  100 mg Oral Daily [name], MD       feeding supplement (NEPRO CARB STEADY) liquid 237 mL  237 mL Oral BID BM [name], MD   237 mL at 07/07/22 1536   furosemide (LASIX) tablet 40 mg  40 mg Oral Daily [name], MD   40 mg at 07/07/22 0817   guaiFENesin (MUCINEX) 12 hr tablet 1,200 mg  1,200 mg Oral BID [name], MD   1,200 mg at 07/08/22 1038   guaiFENesin-dextromethorphan (ROBITUSSIN DM) 100-10 MG/5ML syrup 15 mL  15 mL Oral Q4H PRN [name], MD       heparin injection 5,000 Units  5,000 Units Subcutaneous Q8H [name], MD   5,000 Units at 07/08/22 0071   hydrALAZINE (APRESOLINE) injection 5 mg  5 mg Intravenous Q20 Min PRN [name], MD       HYDROmorphone (DILAUDID) injection 0.5-1 mg  0.5-1 mg Intravenous Q4H PRN [name], MD   1 mg at 07/08/22 0316   insulin aspart (novoLOG) injection 0-15 Units  0-15 Units Subcutaneous TID WC Eugenie Filler, MD   3 Units at 07/08/22 0806   insulin aspart (novoLOG)  injection 4 Units  4 Units Subcutaneous TID WC Eugenie Filler, MD   4 Units at 07/08/22 0805   insulin glargine-yfgn (SEMGLEE) injection 32 Units  32 Units Subcutaneous QHS Eugenie Filler, MD       ipratropium-albuterol (DUONEB) 0.5-2.5 (3) MG/3ML nebulizer solution 3 mL  3 mL Nebulization Q4H PRN [name], MD  labetalol (NORMODYNE) injection 10 mg  10 mg Intravenous Q10 min PRN [name], MD       loperamide (IMODIUM) capsule 2 mg  2 mg Oral PRN [name], MD   2 mg at 07/07/22 7209   magnesium sulfate IVPB 2 g 50 mL  2 g Intravenous Daily PRN [name], MD       metoprolol tartrate (LOPRESSOR) injection 2-5 mg  2-5 mg Intravenous Q2H PRN [name], MD       metroNIDAZOLE (FLAGYL) tablet 500 mg  500 mg Oral Q12H Pham, Minh Q, RPH-CPP   500 mg at 07/08/22 1038   multivitamin (RENA-VIT) tablet 1 tablet  1 tablet Oral QHS [name], MD   1 tablet at 07/07/22 2111   mupirocin ointment (BACTROBAN) 2 % 1 Application  1 Application Nasal BID Eugenie Filler, MD   1 Application at [birth year] 1038   nutrition supplement (JUVEN) (JUVEN) powder packet 1 packet  1 packet Oral BID BM [name], MD   1 packet at 07/08/22 1045   ondansetron (ZOFRAN) injection 4 mg  4 mg Intravenous Q6H PRN [name], MD   4 mg at 07/07/22 2137   ondansetron (ZOFRAN) tablet 4 mg  4 mg Oral Q8H PRN [name], MD       oxyCODONE (Oxy IR/ROXICODONE) immediate release tablet 10-15 mg  10-15 mg Oral Q4H PRN [name], MD   10 mg at 07/06/22 8366   oxyCODONE (Oxy IR/ROXICODONE) immediate release tablet 5-10 mg  5-10 mg Oral Q4H PRN [name], MD   10 mg at 07/06/22 2215   oxyCODONE-acetaminophen (PERCOCET/ROXICET) 5-325 MG per tablet 1 tablet  1 tablet Oral Q4H PRN [name], MD       pantoprazole (PROTONIX) EC tablet 40 mg  40 mg Oral Daily [name], MD   40 mg at 07/07/22 0817   phenol (CHLORASEPTIC) mouth spray 1 spray  1 spray Mouth/Throat PRN [name], MD       polyethylene glycol (MIRALAX / GLYCOLAX) packet 17 g  17 g Oral Daily PRN [name], MD       potassium chloride SA (KLOR-CON M) CR tablet 20-40 mEq  20-40 mEq Oral Daily PRN [name], MD       senna-docusate (Senokot-S) tablet 1 tablet  1 tablet Oral BID [name], MD   1 tablet at 07/06/22 2216   sodium chloride flush (NS) 0.9 % injection 3 mL  3 mL Intravenous Q12H [name], MD   3 mL at 07/08/22 1046   sodium chloride flush (NS) 0.9 % injection 3 mL  3 mL Intravenous PRN [name], MD       sucroferric oxyhydroxide [facility]) chewable tablet 500 mg  500 mg Oral TID WC [name], MD   500 mg at 07/06/22 1730   vancomycin (VANCOCIN) IVPB 1000 mg/200 mL premix  1,000 mg Intravenous Q T,Th,Sa-HD [name], MD 200 mL/hr at 07/05/22 1557 1,000 mg at 07/05/22 1557   zinc sulfate capsule 220 mg  220 mg Oral Daily [name], MD   220 mg at 07/07/22 2947     Discharge Medications: Please see discharge summary for a list of discharge medications.  Relevant Imaging Results:  Relevant Lab Results:   Additional Information SS#: [id number]; HD pt  Joanne Chars, LCSW

## 2022-07-08 NOTE — Telephone Encounter (Signed)
emailed

## 2022-07-08 NOTE — TOC Transition Note (Signed)
Transition of Care [facility]) - CM/SW Discharge Note   Patient Details  Name: [name] WEISGERBER Sr. MRN: [id number] Date of Birth: 1961-10-15  Transition of Care [facility]) CM/SW Contact:  Joanne Chars, LCSW Phone Number: 07/08/2022, 3:08 PM   Clinical Narrative:   Pt discharging to [facility] healthcare.  RN call [phone] for report.    PT[facility] transport arranged for 6pm.  If this time needs to be changed, call [facility]AR at [phone].   Final next level of care: Skilled Nursing Facility Barriers to Discharge: Barriers Resolved   Patient Goals and CMS Choice        Discharge Placement              Patient chooses bed at: [facility] Patient to be transferred to facility by: [facility] Name of family member notified: daugher [name] [name] Patient and family notified of of transfer: 07/08/22  Discharge Plan and Services In-house Referral: Clinical Social Work              DME Arranged: N/A DME Agency: NA                  Social Determinants of Health ([place]) Interventions     Readmission Risk Interventions     No data to display

## 2022-07-16 ENCOUNTER — Encounter: Payer: Self-pay | Admitting: Family

## 2022-07-16 ENCOUNTER — Ambulatory Visit (INDEPENDENT_AMBULATORY_CARE_PROVIDER_SITE_OTHER): Payer: Medicare Other | Admitting: Family

## 2022-07-16 DIAGNOSIS — S88111A Complete traumatic amputation at level between knee and ankle, right lower leg, initial encounter: Secondary | ICD-10-CM

## 2022-07-16 NOTE — Progress Notes (Signed)
Post-Op Visit Note   Patient: [name] BABY Sr.           Date of Birth: Oct 27, 1961           MRN: [id number] Visit Date: 07/16/2022 PCP: Pcp, No  Chief Complaint: No chief complaint on file.   HPI:  HPI The patient is a 61 year old gentleman seen status post revision below-knee amputation on the right wound VAC removed this was not turned on did not appear to be on possibly ever.  Ortho Exam On examination of the right residual limb incision well approximated with sutures there is no gaping drainage no maceration he does have a medial knee hematoma this was debrided with gauze is measuring 6 cm in diameter this is superficial with granulation that is bleeding. Visit Diagnoses: No diagnosis found.  Plan: Silvadene dressings to the ulcer to the medial knee.  Dry dressings please change these daily with Dial soap cleansing to the incision follow-up in 2 weeks for suture removal.  Continue shrinker.  Follow-Up Instructions: No follow-ups on file.   Imaging: No results found.  Orders:  No orders of the defined types were placed in this encounter.  No orders of the defined types were placed in this encounter.    PMFS History: Patient Active Problem List   Diagnosis Date Noted   Hyperosmolar hyperglycemic state (HHS) ([place])    Diarrhea    Dehiscence of amputation stump of right lower extremity (HCC)    Abscess of right lower leg    Pressure injury of skin 07/02/2022   Cellulitis of right leg 07/01/2022   CAD (coronary artery disease) 05/24/2022   Leukocytosis 05/24/2022   Below-knee amputation of right lower extremity ([place]) 05/23/2022   Obesity (BMI 30-39.9) 02/19/2022   History of CVA  02/05/2022   Carotid stenosis 02/05/2022   acute on chronic Anemia of chronic renal failure  02/03/2022   Subacute osteomyelitis, right ankle and foot ([place]) 55/20/8022   Acute metabolic encephalopathy 33/61/2244   Protein-calorie malnutrition, mild ([place]) 02/02/2022   GERD  (gastroesophageal reflux disease) 97/53/0051   Chronic systolic CHF (congestive heart failure) ([place]) 08/14/2019   LBBB (left bundle branch block) 08/13/2019   Problem with vascular access 08/13/2019   Dialysis AV fistula malfunction ([place]) 08/12/2019   ESRD (end stage renal disease) ([place]) 09/20/2018   DM2 (diabetes mellitus, type 2) ([place]) 09/20/2018   HTN (hypertension) 09/20/2018   Past Medical History:  Diagnosis Date   CHF (congestive heart failure) ([place])    ECHO 02/05/22  EF 45-50%, DD indeterminent   Diabetes mellitus without complication ([place])    type 2   ESRD on hemodialysis ([place])    mon wed fri   Hypertension    Kidney failure    Myocardial infarction ([place])    Nonischemic cardiomyopathy ([place])     Family History  Problem Relation Age of Onset   Hypertension Mother     Past Surgical History:  Procedure Laterality Date   A/V FISTULAGRAM N/A 08/15/2019   Procedure: A/V FISTULAGRAM;  Surgeon: [name], MD;  Location: [place] [facility];  Service: Cardiovascular;  Laterality: N/A;   AMPUTATION Right 02/02/2022   Procedure: AMPUTATION 5th RAY;  Surgeon: [name], MD;  Location: [place];  Service: Podiatry;  Laterality: Right;   AMPUTATION Right 05/23/2022   Procedure: RIGHT BELOW KNEE AMPUTATION;  Surgeon: [name], MD;  Location: [place];  Service: Orthopedics;  Laterality: Right;   AMPUTATION Right 07/04/2022   Procedure: REVISION  AMPUTATION BELOW KNEE;  Surgeon: [name], MD;  Location: [place];  Service: Orthopedics;  Laterality: Right;   APPLICATION OF WOUND VAC  05/23/2022   Procedure: APPLICATION OF WOUND VAC;  Surgeon: [name], MD;  Location: [place];  Service: Orthopedics;;   AV FISTULA PLACEMENT     CARDIAC CATHETERIZATION  02/17/2018   LHC 02/17/18 ([name]): 30% pLAD, 30% dRCA, LVEDP 25-30, LVEF 25%.     CATARACT EXTRACTION W/PHACO Right 03/29/2020   Procedure: CATARACT EXTRACTION PHACO AND INTRAOCULAR LENS PLACEMENT ([place])   RIGHT DIABETIC;  Surgeon: [name], MD;  Location: [facility];  Service: Ophthalmology;  Laterality: Right;  Lot # [id number] [place]: 02:30.9 AP% 9.5% CDE: 14.38   EYE SURGERY Right    cataract removed   I & D EXTREMITY Right 02/02/2022   Procedure: IRRIGATION AND DEBRIDEMENT RIGHT FOOT;  Surgeon: [name], MD;  Location: [place];  Service: Podiatry;  Laterality: Right;   INCISION AND DRAINAGE OF WOUND Right 02/08/2022   Procedure: IRRIGATION AND DEBRIDEMENT WOUND;  Surgeon: [name], DPM;  Location: [place];  Service: Podiatry;  Laterality: Right;  with removal of infected bone    INSERTION OF DIALYSIS CATHETER  08/13/2019   Procedure: Insertion Of Dialysis Catheter;  Surgeon: [name], MD;  Location: [place];  Service: Vascular;;   INSERTION OF DIALYSIS CATHETER Left 07/19/2020   Procedure: INSERTION OF DIALYSIS CATHETER;  Surgeon: [name], MD;  Location: [place];  Service: Vascular;  Laterality: Left;   IRRIGATION AND DEBRIDEMENT ABSCESS     PERIPHERAL VASCULAR BALLOON ANGIOPLASTY  08/15/2019   Procedure: PERIPHERAL VASCULAR BALLOON ANGIOPLASTY;  Surgeon: [name], MD;  Location: [place] [facility];  Service: Cardiovascular;;   PERIPHERAL VASCULAR BALLOON ANGIOPLASTY Left 02/06/2022   Procedure: PERIPHERAL VASCULAR BALLOON ANGIOPLASTY;  Surgeon: [name], MD;  Location: [place] [facility];  Service: Cardiovascular;  Laterality: Left;   REVISON OF ARTERIOVENOUS FISTULA Left 07/19/2020   Procedure: REVISON/PLICATION OF ARTERIOVENOUS FISTULA LEFT;  Surgeon: [name], MD;  Location: [facility];  Service: Vascular;  Laterality: Left;   THROMBECTOMY AND REVISION OF ARTERIOVENTOUS (AV) GORETEX  GRAFT Left 08/13/2019   Procedure: THROMBECTOMY OF LEFT ARM ARTERIOVENTOUS (AV) FISTULA;  Surgeon: [name], MD;  [facility];  Service: Vascular;  Laterality: Left;   TOE AMPUTATION     Social History   Occupational History   Not  on file  Tobacco Use   Smoking status: Never   Smokeless tobacco: Never  Vaping Use   Vaping Use: Never used  Substance and Sexual Activity   Alcohol use: Not Currently   Drug use: Never   Sexual activity: Not on file

## 2022-07-18 DIAGNOSIS — L03116 Cellulitis of left lower limb: Secondary | ICD-10-CM | POA: Insufficient documentation

## 2022-07-24 ENCOUNTER — Telehealth: Payer: Self-pay

## 2022-07-24 NOTE — Telephone Encounter (Signed)
Called and sw nurse this pt is a BKA revision made an appt for eval with Junie Panning tomorrow. Will call with any other questions.

## 2022-07-24 NOTE — Telephone Encounter (Signed)
[name], wound care nurse at [facility] wanted to let Dr. Sharol Given know that patient is having moderate drainage on the lateral side of his surgical incision with a depth of 2.1 centimeters.  Cb# [phone].  Please advise.  Thank you.

## 2022-07-25 ENCOUNTER — Ambulatory Visit (INDEPENDENT_AMBULATORY_CARE_PROVIDER_SITE_OTHER): Payer: Medicare Other | Admitting: Family

## 2022-07-25 DIAGNOSIS — S88111A Complete traumatic amputation at level between knee and ankle, right lower leg, initial encounter: Secondary | ICD-10-CM

## 2022-07-25 DIAGNOSIS — Z89511 Acquired absence of right leg below knee: Secondary | ICD-10-CM

## 2022-07-29 ENCOUNTER — Inpatient Hospital Stay: Payer: Medicare Other | Admitting: Internal Medicine

## 2022-07-30 ENCOUNTER — Encounter: Payer: Self-pay | Admitting: Family

## 2022-07-30 ENCOUNTER — Ambulatory Visit (INDEPENDENT_AMBULATORY_CARE_PROVIDER_SITE_OTHER): Payer: Medicare Other | Admitting: Family

## 2022-07-30 DIAGNOSIS — Z89511 Acquired absence of right leg below knee: Secondary | ICD-10-CM

## 2022-07-30 DIAGNOSIS — S88111A Complete traumatic amputation at level between knee and ankle, right lower leg, initial encounter: Secondary | ICD-10-CM

## 2022-07-30 NOTE — Progress Notes (Signed)
Post-Op Visit Note   Patient: [name] JESSOP Sr.           Date of Birth: [birth year]           MRN: [id number] Visit Date: 07/30/2022 PCP: Pcp, No  Chief Complaint:  Chief Complaint  Patient presents with   Right Leg - Routine Post Op    07/04/2022 revision right BKA     HPI:  HPI The patient is a 61 year old gentleman seen status post revision below-knee amputation on August 18 he is currently residing at [facility] care he has a few remaining sutures and staples Ortho Exam Remaining staples harvested 2 sutures left in place There is good consolidation of the residual limb he continues to have mild to moderate bloody drainage from the remaining area of his lateral below-knee amputation there is some epiboly of the edges this is approximated with sutures  Visit Diagnoses: No diagnosis found.  Plan: Continue daily Dial soap cleansing dry dressings wear shrinker around-the-clock  Follow-Up Instructions: No follow-ups on file.   Imaging: No results found.  Orders:  No orders of the defined types were placed in this encounter.  No orders of the defined types were placed in this encounter.    PMFS History: Patient Active Problem List   Diagnosis Date Noted   Hyperosmolar hyperglycemic state (HHS) ([place])    Diarrhea    Dehiscence of amputation stump of right lower extremity (HCC)    Abscess of right lower leg    Pressure injury of skin 07/02/2022   Cellulitis of right leg 07/01/2022   CAD (coronary artery disease) 05/24/2022   Leukocytosis 05/24/2022   Below-knee amputation of right lower extremity ([place]) 05/23/2022   Obesity (BMI 30-39.9) 02/19/2022   History of CVA  02/05/2022   Carotid stenosis 02/05/2022   acute on chronic Anemia of chronic renal failure  02/03/2022   Subacute osteomyelitis, right ankle and foot ([place]) 95/18/8416   Acute metabolic encephalopathy 60/63/0160   Protein-calorie malnutrition, mild ([place]) 02/02/2022   GERD (gastroesophageal reflux  disease) 10/93/2355   Chronic systolic CHF (congestive heart failure) ([place]) 08/14/2019   LBBB (left bundle branch block) 08/13/2019   Problem with vascular access 08/13/2019   Dialysis AV fistula malfunction ([place]) 08/12/2019   ESRD (end stage renal disease) ([place]) 09/20/2018   DM2 (diabetes mellitus, type 2) ([place]) 09/20/2018   HTN (hypertension) 09/20/2018   Past Medical History:  Diagnosis Date   CHF (congestive heart failure) ([place])    ECHO 02/05/22  EF 45-50%, DD indeterminent   Diabetes mellitus without complication ([place])    type 2   ESRD on hemodialysis ([place])    mon wed fri   Hypertension    Kidney failure    Myocardial infarction ([place])    Nonischemic cardiomyopathy ([place])     Family History  Problem Relation Age of Onset   Hypertension Mother     Past Surgical History:  Procedure Laterality Date   A/V FISTULAGRAM N/A 08/15/2019   Procedure: A/V FISTULAGRAM;  Surgeon: [name], MD;  Location: [place] [facility];  Service: Cardiovascular;  Laterality: N/A;   AMPUTATION Right 02/02/2022   Procedure: AMPUTATION 5th RAY;  Surgeon: [name], MD;  Location: [place];  Service: Podiatry;  Laterality: Right;   AMPUTATION Right 05/23/2022   Procedure: RIGHT BELOW KNEE AMPUTATION;  Surgeon: [name], MD;  Location: [place];  Service: Orthopedics;  Laterality: Right;   AMPUTATION Right 07/04/2022   Procedure: REVISION AMPUTATION BELOW KNEE;  Surgeon: [name], MD;  Location: [place];  Service: Orthopedics;  Laterality: Right;   APPLICATION OF WOUND VAC  05/23/2022   Procedure: APPLICATION OF WOUND VAC;  Surgeon: [name], MD;  Location: [place];  Service: Orthopedics;;   AV FISTULA PLACEMENT     CARDIAC CATHETERIZATION  02/17/2018   LHC 02/17/18 ([name]): 30% pLAD, 30% dRCA, LVEDP 25-30, LVEF 25%.     CATARACT EXTRACTION W/PHACO Right 03/29/2020   Procedure: CATARACT EXTRACTION PHACO AND INTRAOCULAR LENS PLACEMENT ([place])  RIGHT DIABETIC;  Surgeon:  [name], MD;  Location: [facility];  Service: Ophthalmology;  Laterality: Right;  Lot # [id number] H [place]: 02:30.9 AP% 9.5% CDE: 14.38   EYE SURGERY Right    cataract removed   I & D EXTREMITY Right 02/02/2022   Procedure: IRRIGATION AND DEBRIDEMENT RIGHT FOOT;  Surgeon: [name], MD;  Location: [place];  Service: Podiatry;  Laterality: Right;   INCISION AND DRAINAGE OF WOUND Right 02/08/2022   Procedure: IRRIGATION AND DEBRIDEMENT WOUND;  Surgeon: [name], DPM;  Location: [place];  Service: Podiatry;  Laterality: Right;  with removal of infected bone    INSERTION OF DIALYSIS CATHETER  08/13/2019   Procedure: Insertion Of Dialysis Catheter;  Surgeon: [name], MD;  Location: [place];  Service: Vascular;;   INSERTION OF DIALYSIS CATHETER Left 07/19/2020   Procedure: INSERTION OF DIALYSIS CATHETER;  Surgeon: [name], MD;  Location: [place];  Service: Vascular;  Laterality: Left;   IRRIGATION AND DEBRIDEMENT ABSCESS     PERIPHERAL VASCULAR BALLOON ANGIOPLASTY  08/15/2019   Procedure: PERIPHERAL VASCULAR BALLOON ANGIOPLASTY;  Surgeon: [name], MD;  Location: [place] [facility];  Service: Cardiovascular;;   PERIPHERAL VASCULAR BALLOON ANGIOPLASTY Left 02/06/2022   Procedure: PERIPHERAL VASCULAR BALLOON ANGIOPLASTY;  Surgeon: [name], MD;  Location: [place] [facility];  Service: Cardiovascular;  Laterality: Left;   REVISON OF ARTERIOVENOUS FISTULA Left 07/19/2020   Procedure: REVISON/PLICATION OF ARTERIOVENOUS FISTULA LEFT;  Surgeon: [name], MD;  Location: [facility];  Service: Vascular;  Laterality: Left;   THROMBECTOMY AND REVISION OF ARTERIOVENTOUS (AV) GORETEX  GRAFT Left 08/13/2019   Procedure: THROMBECTOMY OF LEFT ARM ARTERIOVENTOUS (AV) FISTULA;  Surgeon: [name], MD;  [facility];  Service: Vascular;  Laterality: Left;   TOE AMPUTATION     Social History   Occupational History   Not on file  Tobacco Use    Smoking status: Never   Smokeless tobacco: Never  Vaping Use   Vaping Use: Never used  Substance and Sexual Activity   Alcohol use: Not Currently   Drug use: Never   Sexual activity: Not on file

## 2022-07-30 NOTE — Progress Notes (Signed)
Post-Op Visit Note   Patient: [name] HISAW Sr.           Date of Birth: 1961-05-26           MRN: [id number] Visit Date: 07/25/2022 PCP: Pcp, No  Chief Complaint:  Chief Complaint  Patient presents with   Right Leg - Routine Post Op    8/18/223 revision right BKA kerecis graft     HPI:  HPI The patient is a 61 year old gentleman seen status post revision of his right below-knee amputation with Kerecis graft placement.  Staples and stitches are in place.  He is concerned about a small area puckering to his lateral incision Ortho Exam On examination of the right residual limb staples and sutures are in place there is no dehiscence overall the incision is healing quite well sutures and staples harvested without incident there is scant bloody drainage laterally.  No erythema no warmth the ulcer to his medial calf is nearly healed  Visit Diagnoses: No diagnosis found.  Plan: Continue daily Dial soap cleansing shrinker with direct skin contact.  Plan to follow-up with him in a few weeks. shrinker around-the-clock  Follow-Up Instructions: No follow-ups on file.   Imaging: No results found.  Orders:  No orders of the defined types were placed in this encounter.  No orders of the defined types were placed in this encounter.    PMFS History: Patient Active Problem List   Diagnosis Date Noted   Hyperosmolar hyperglycemic state (HHS) ([place])    Diarrhea    Dehiscence of amputation stump of right lower extremity (HCC)    Abscess of right lower leg    Pressure injury of skin 07/02/2022   Cellulitis of right leg 07/01/2022   CAD (coronary artery disease) 05/24/2022   Leukocytosis 05/24/2022   Below-knee amputation of right lower extremity ([place]) 05/23/2022   Obesity (BMI 30-39.9) 02/19/2022   History of CVA  02/05/2022   Carotid stenosis 02/05/2022   acute on chronic Anemia of chronic renal failure  02/03/2022   Subacute osteomyelitis, right ankle and foot ([place]) 35/57/3220    Acute metabolic encephalopathy 25/42/7062   Protein-calorie malnutrition, mild ([place]) 02/02/2022   GERD (gastroesophageal reflux disease) 37/62/8315   Chronic systolic CHF (congestive heart failure) ([place]) 08/14/2019   LBBB (left bundle branch block) 08/13/2019   Problem with vascular access 08/13/2019   Dialysis AV fistula malfunction ([place]) 08/12/2019   ESRD (end stage renal disease) ([place]) 09/20/2018   DM2 (diabetes mellitus, type 2) ([place]) 09/20/2018   HTN (hypertension) 09/20/2018   Past Medical History:  Diagnosis Date   CHF (congestive heart failure) ([place])    ECHO 02/05/22  EF 45-50%, DD indeterminent   Diabetes mellitus without complication ([place])    type 2   ESRD on hemodialysis ([place])    mon wed fri   Hypertension    Kidney failure    Myocardial infarction ([place])    Nonischemic cardiomyopathy ([place])     Family History  Problem Relation Age of Onset   Hypertension Mother     Past Surgical History:  Procedure Laterality Date   A/V FISTULAGRAM N/A 08/15/2019   Procedure: A/V FISTULAGRAM;  Surgeon: [name], MD;  Location: [facility];  Service: Cardiovascular;  Laterality: N/A;   AMPUTATION Right 02/02/2022   Procedure: AMPUTATION 5th RAY;  Surgeon: [name], MD;  Location: [facility];  Service: Podiatry;  Laterality: Right;   AMPUTATION Right 05/23/2022   Procedure: RIGHT BELOW KNEE AMPUTATION;  Surgeon:  [name], MD;  Location: [place];  Service: Orthopedics;  Laterality: Right;   AMPUTATION Right 07/04/2022   Procedure: REVISION AMPUTATION BELOW KNEE;  Surgeon: [name], MD;  Location: [place];  Service: Orthopedics;  Laterality: Right;   APPLICATION OF WOUND VAC  05/23/2022   Procedure: APPLICATION OF WOUND VAC;  Surgeon: [name], MD;  Location: [facility];  Service: Orthopedics;;   AV FISTULA PLACEMENT     CARDIAC CATHETERIZATION  02/17/2018   LHC 02/17/18 ([name]): 30% pLAD, 30% dRCA, LVEDP 25-30, LVEF 25%.     CATARACT EXTRACTION  W/PHACO Right 03/29/2020   Procedure: CATARACT EXTRACTION PHACO AND INTRAOCULAR LENS PLACEMENT ([place])  RIGHT DIABETIC;  Surgeon: [name], MD;  Location: [facility];  Service: Ophthalmology;  Laterality: Right;  Lot # [id number] H [place]: 02:30.9 AP% 9.5% CDE: 14.38   EYE SURGERY Right    cataract removed   I & D EXTREMITY Right 02/02/2022   Procedure: IRRIGATION AND DEBRIDEMENT RIGHT FOOT;  Surgeon: [name], MD;  Location: [place] [facility];  Service: Podiatry;  Laterality: Right;   INCISION AND DRAINAGE OF WOUND Right 02/08/2022   Procedure: IRRIGATION AND DEBRIDEMENT WOUND;  Surgeon: [name], DPM;  Location: [place];  Service: Podiatry;  Laterality: Right;  with removal of infected bone    INSERTION OF DIALYSIS CATHETER  08/13/2019   Procedure: Insertion Of Dialysis Catheter;  Surgeon: [name], MD;  Location: [place];  Service: Vascular;;   INSERTION OF DIALYSIS CATHETER Left 07/19/2020   Procedure: INSERTION OF DIALYSIS CATHETER;  Surgeon: [name], MD;  Location: [place];  Service: Vascular;  Laterality: Left;   IRRIGATION AND DEBRIDEMENT ABSCESS     PERIPHERAL VASCULAR BALLOON ANGIOPLASTY  08/15/2019   Procedure: PERIPHERAL VASCULAR BALLOON ANGIOPLASTY;  Surgeon: [name], MD;  Location: [facility];  Service: Cardiovascular;;   PERIPHERAL VASCULAR BALLOON ANGIOPLASTY Left 02/06/2022   Procedure: PERIPHERAL VASCULAR BALLOON ANGIOPLASTY;  Surgeon: [name], MD;  Location: [facility];  Service: Cardiovascular;  Laterality: Left;   REVISON OF ARTERIOVENOUS FISTULA Left 07/19/2020   Procedure: REVISON/PLICATION OF ARTERIOVENOUS FISTULA LEFT;  Surgeon: [name], MD;  Location: [facility];  Service: Vascular;  Laterality: Left;   THROMBECTOMY AND REVISION OF ARTERIOVENTOUS (AV) GORETEX  GRAFT Left 08/13/2019   Procedure: THROMBECTOMY OF LEFT ARM ARTERIOVENTOUS (AV) FISTULA;  Surgeon: [name], MD;  [facility];   Service: Vascular;  Laterality: Left;   TOE AMPUTATION     Social History   Occupational History   Not on file  Tobacco Use   Smoking status: Never   Smokeless tobacco: Never  Vaping Use   Vaping Use: Never used  Substance and Sexual Activity   Alcohol use: Not Currently   Drug use: Never   Sexual activity: Not on file

## 2022-07-31 ENCOUNTER — Telehealth: Payer: Self-pay

## 2022-07-31 ENCOUNTER — Telehealth: Payer: Self-pay | Admitting: Family

## 2022-07-31 NOTE — Telephone Encounter (Signed)
I called and [facility] wound care nurse and she advised that they will apply silver alginate packing to tunneling at 4 o'clock and will follow up in the office in 2 weeks. Will call for sooner appt if needed.

## 2022-07-31 NOTE — Telephone Encounter (Signed)
[name] called again concerning wound care order. Please advise.

## 2022-07-31 NOTE — Telephone Encounter (Signed)
[name] with [facility] left message on Triage phone yesterday at 5:00pm requesting a call back at [phone]. Regarding the surgical site drainage and states that the patient went to wound care on Wednesday 07/30/22 and the provider ordered that a dressing be applied to the wound also to help with the drainage.  [name] just want to know if Junie Panning is ok with the dressing being applied.

## 2022-08-06 ENCOUNTER — Other Ambulatory Visit: Payer: Self-pay

## 2022-08-06 ENCOUNTER — Encounter: Payer: Self-pay | Admitting: Internal Medicine

## 2022-08-06 ENCOUNTER — Ambulatory Visit (INDEPENDENT_AMBULATORY_CARE_PROVIDER_SITE_OTHER): Payer: Medicare Other | Admitting: Internal Medicine

## 2022-08-06 VITALS — BP 138/83 | HR 86 | Resp 16 | Ht 73.0 in | Wt 232.0 lb

## 2022-08-06 DIAGNOSIS — L0390 Cellulitis, unspecified: Secondary | ICD-10-CM

## 2022-08-06 NOTE — Patient Instructions (Signed)
Please check ortho about suture removal  See me in 4 weeks

## 2022-08-06 NOTE — Progress Notes (Signed)
[facility] for Infectious Disease  Patient Active Problem List   Diagnosis Date Noted   Hyperosmolar hyperglycemic state (HHS) ([place])    Diarrhea    Dehiscence of amputation stump of right lower extremity (HCC)    Abscess of right lower leg    Pressure injury of skin 07/02/2022   Cellulitis of right leg 07/01/2022   CAD (coronary artery disease) 05/24/2022   Leukocytosis 05/24/2022   Below-knee amputation of right lower extremity ([place]) 05/23/2022   Obesity (BMI 30-39.9) 02/19/2022   History of CVA  02/05/2022   Carotid stenosis 02/05/2022   acute on chronic Anemia of chronic renal failure  02/03/2022   Subacute osteomyelitis, right ankle and foot ([place]) 16/08/9603   Acute metabolic encephalopathy 54/07/8118   Protein-calorie malnutrition, mild ([place]) 02/02/2022   GERD (gastroesophageal reflux disease) 14/78/2956   Chronic systolic CHF (congestive heart failure) ([place]) 08/14/2019   LBBB (left bundle branch block) 08/13/2019   Problem with vascular access 08/13/2019   Dialysis AV fistula malfunction ([place]) 08/12/2019   ESRD (end stage renal disease) ([place]) 09/20/2018   DM2 (diabetes mellitus, type 2) ([place]) 09/20/2018   HTN (hypertension) 09/20/2018      Subjective:    Patient ID: [name] Cruel Sr., male    DOB: [birth year], 61 y.o.   MRN: [id number]  No chief complaint on file.   HPI:  [name] DENNE Sr. is a 61 y.o. male here for f/u bka stump infection  He was admitted and discharged from [facility] 8/22. Dr Sharol Given revaluated the wound and did revision bka. No cx sent  Given vanc/ceftriaxone/flagyl in hospital then discharged on 2 weeks doxy/augmentin. No path sent; ortho thought they obtained clear margin  Allergies  Allergen Reactions   Sulfa Antibiotics Nausea And Vomiting and Other (See Comments)    "Allergic," per [facility]      Outpatient Medications Prior to Visit  Medication Sig Dispense Refill   acetaminophen (TYLENOL) 325 MG tablet Take 1-2 tablets  (325-650 mg total) by mouth every 6 (six) hours as needed for mild pain (pain score 1-3 or temp > 100.5).     Amino Acids-Protein Hydrolys (FEEDING SUPPLEMENT, PRO-STAT SUGAR FREE 64,) LIQD Take 30 mLs by mouth 2 (two) times daily.     Amino Acids-Protein Hydrolys (PRO-STAT AWC) LIQD Take 30 mLs by mouth 2 (two) times daily.     amLODipine (NORVASC) 5 MG tablet Take 5 mg by mouth in the morning.     aspirin EC 81 MG EC tablet Take 1 tablet (81 mg total) by mouth daily. Swallow whole. 30 tablet 11   benzonatate (TESSALON) 200 MG capsule Take 200 mg by mouth See admin instructions. Take 200 mg by mouth at 8 AM, 2 PM, and 8 PM on Sun/Mon/Wed/Fri and 200 mg at 8 AM and 5 PM every Tues/Thurs/Sat     ethyl chloride spray Apply 1 Application topically Every Tuesday,Thursday,and Saturday with dialysis.     famotidine (PEPCID) 20 MG tablet Take 20 mg by mouth at bedtime.     guaiFENesin (ROBITUSSIN) 100 MG/5ML liquid Take 10 mLs by mouth every 4 (four) hours as needed for cough.     insulin glargine (LANTUS) 100 UNIT/ML injection Inject 0.32 mLs (32 Units total) into the skin at bedtime. 10 mL 0   ipratropium-albuterol (DUONEB) 0.5-2.5 (3) MG/3ML SOLN Take 3 mLs by nebulization every 4 (four) hours as needed (for wheezing or shortness of breath).     loperamide (IMODIUM)  2 MG capsule Take 1 capsule (2 mg total) by mouth as needed for diarrhea or loose stools. 30 capsule 0   multivitamin (RENA-VIT) TABS tablet Take 1 tablet by mouth at bedtime. (Patient not taking: Reported on 05/22/2022)  0   Nutritional Supplements (,FEEDING SUPPLEMENT, PROSOURCE PLUS) liquid Take 30 mLs by mouth 2 (two) times daily between meals. (Patient not taking: Reported on 05/22/2022)     Nutritional Supplements (FEEDING SUPPLEMENT, NEPRO CARB STEADY,) LIQD Take 237 mLs by mouth 2 (two) times daily between meals. (Patient not taking: Reported on 05/22/2022)  0   Nutritional Supplements (NEPRO) LIQD Take by mouth 2 (two) times daily.      ondansetron (ZOFRAN) 4 MG tablet Take 4 mg by mouth every 6 (six) hours as needed for nausea or vomiting.     oxyCODONE-acetaminophen (PERCOCET) 5-325 MG tablet Take 1 tablet by mouth every 4 (four) hours as needed for severe pain. 15 tablet 0   pantoprazole (PROTONIX) 40 MG tablet Take 1 tablet (40 mg total) by mouth daily at 12 noon. (Patient taking differently: Take 40 mg by mouth daily before breakfast.)     senna-docusate (SENOKOT-S) 8.6-50 MG tablet Take 1 tablet by mouth at bedtime as needed for mild constipation.     sucroferric oxyhydroxide (VELPHORO) 500 MG chewable tablet Chew 1,000 mg by mouth See admin instructions. Chew 1,000 mg by mouth at 9 AM and 5 PM on daily and an additional 1,000 mg at noon only on Mon/Wed/Fri     sucroferric oxyhydroxide (VELPHORO) 500 MG chewable tablet Chew 1,000 mg by mouth See admin instructions. Chew 1,000 mg by mouth one time a day every Mon, Wed, Fri, Sun with lunch     No facility-administered medications prior to visit.     Social History   Socioeconomic History   Marital status: Divorced    Spouse name: Not on file   Number of children: Not on file   Years of education: Not on file   Highest education level: Not on file  Occupational History   Not on file  Tobacco Use   Smoking status: Never   Smokeless tobacco: Never  Vaping Use   Vaping Use: Never used  Substance and Sexual Activity   Alcohol use: Not Currently   Drug use: Never   Sexual activity: Not on file  Other Topics Concern   Not on file  Social History Narrative   Not on file   Social Determinants of Health   Financial Resource Strain: Not on file  Food Insecurity: Not on file  Transportation Needs: Not on file  Physical Activity: Not on file  Stress: Not on file  Social Connections: Not on file  Intimate Partner Violence: Not on file      Review of Systems    All other ros negative  Objective:    There were no vitals taken for this visit. Nursing note  and vital signs reviewed.  Physical Exam     General/constitutional: no distress, pleasant HEENT: Normocephalic, PER, Conj Clear, EOMI, Oropharynx clear Neck supple CV: rrr no mrg Lungs: clear to auscultation, normal respiratory effort Abd: Soft, Nontender Ext: no edema Skin: No Rash Neuro: nonfocal MSK: right bka stump incision mostly healed; slight swelling and minimal warmth no purulence/erythema; along edge laterally of the incision is some echymosis; slight serosanguinous oozing; the incision area between the 2 sutures havent fully closed   Labs:  Micro:  Serology:  Imaging:  Assessment & Plan:   Problem List  Items Addressed This Visit   None Visit Diagnoses     Cellulitis, unspecified cellulitis site    -  Primary         No orders of the defined types were placed in this encounter.    Abx: 8/22 +2weeks doxy/amox-clav  8/18-22 vanc 8/15-22 ceftriaxone 8/15-22 metronidazole                                                                 Assessment: 61 yo male with esrd on iHD, dm2, cad, htn/hlp, s/p bka on 7/07 right side, complicated by surgical site infection admitted 8/15 due to sepsis, awaiting revision bka as patient had refused aka (done 8/18 abscess debridement/more proximal resection of bka stump)   S/p debridement and more proximal bka amputation per dr duda; clean margin noted during operation. Abscess found in soft tissue debrided. No cx sent  Patient received 2 weeks abx as above transitioned to doxy/augmentin   He had ortho f/u 8/30; no sign of infection but a 6 cm hematoma was debrided in the office. Last f/u ortho 9/13; most sutures/staples removed with 2 pieces of sutures left in place; no sign of infection outside of blood/granulating tissues.  Today 9/20 slight swelling on exam but no pain; some bruising echymoses along edge of wound. The area between the 2 sutures hasn't completely closed. No purulence       Plan: Continue off  antibiotics F/u ortho for ongoing wound evaluation and suture removal  See me again in around 4 weeks  No labs needed today       Follow-up: Return in about 4 weeks (around 09/03/2022).      Jabier Mutton, [facility]ious [facility] 08/06/2022, 10:44 AM

## 2022-08-07 ENCOUNTER — Telehealth: Payer: Self-pay

## 2022-08-07 NOTE — Telephone Encounter (Signed)
Talked with [name] and advised her of message below per Autumn F., but she stated that patient is not able to come in and that patient has Dialysis today.  Talked with [place] and Autumn F., Dr. Jess Barters assistants and was advised to let [name] know to call back if RT BKA gets any worse and to continue doing what they are doing until his F/U on 08/13/2022.   [name] voiced that she understands.

## 2022-08-07 NOTE — Telephone Encounter (Signed)
Can you please call and make an appt for this afternoon please if there are changes we need to see it

## 2022-08-07 NOTE — Telephone Encounter (Signed)
[name], wound care nurse with [facility] wanted to let Dr. Sharol Given know that there is a decline in patient's wound of RT BKA, but no sign of infection.  CB# [phone].  Please advise.  Thank you.

## 2022-08-13 ENCOUNTER — Ambulatory Visit (INDEPENDENT_AMBULATORY_CARE_PROVIDER_SITE_OTHER): Payer: Medicare Other | Admitting: Family

## 2022-08-13 DIAGNOSIS — T148XXA Other injury of unspecified body region, initial encounter: Secondary | ICD-10-CM

## 2022-08-13 DIAGNOSIS — Z89511 Acquired absence of right leg below knee: Secondary | ICD-10-CM

## 2022-08-13 DIAGNOSIS — S88111A Complete traumatic amputation at level between knee and ankle, right lower leg, initial encounter: Secondary | ICD-10-CM

## 2022-08-20 ENCOUNTER — Encounter: Payer: Self-pay | Admitting: Family

## 2022-08-20 NOTE — Progress Notes (Signed)
Post-Op Visit Note   Patient: [name] AMBROSINO Sr.           Date of Birth: 01-Sep-1961           MRN: [id number] Visit Date: 08/13/2022 PCP: Pcp, No  Chief Complaint:  Chief Complaint  Patient presents with   Right Leg - Routine Post Op    07/04/2022 revision right BKA kerecis     HPI:  HPI The patient is a 61 year old gentleman seen in postoperative follow-up status post revision right below-knee amputation August 18.  He is residing at skilled nursing.  The wound care nurse was concerned about some hematoma she was able to express from his residual limb Ortho Exam On examination of the right residual limb there is a 2 cm open area of this does probe about 4 cm deep does not probe to bone there is scant hematoma expressed today.  There is no epiboly no warmth no odor no sign of cellulitis  Visit Diagnoses: No diagnosis found.  Plan: Concern for hematoma concern for slow to poor healing.  Discussed concerns with the patient and daughter by phone.  He will continue his current wound cleansing daily with Dial soap packed open with Iodoform or gauze.  Follow-up in the office in 2 weeks.  Follow-Up Instructions: Return in about 2 weeks (around 08/27/2022).   Imaging: No results found.  Orders:  No orders of the defined types were placed in this encounter.  No orders of the defined types were placed in this encounter.    PMFS History: Patient Active Problem List   Diagnosis Date Noted   Hyperosmolar hyperglycemic state (HHS) ([place])    Diarrhea    Dehiscence of amputation stump of right lower extremity (HCC)    Abscess of right lower leg    Pressure injury of skin 07/02/2022   Cellulitis of right leg 07/01/2022   CAD (coronary artery disease) 05/24/2022   Leukocytosis 05/24/2022   Below-knee amputation of right lower extremity ([place]) 05/23/2022   Obesity (BMI 30-39.9) 02/19/2022   History of CVA  02/05/2022   Carotid stenosis 02/05/2022   acute on chronic Anemia of chronic  renal failure  02/03/2022   Subacute osteomyelitis, right ankle and foot ([place]) 42/35/3614   Acute metabolic encephalopathy 43/15/4008   Protein-calorie malnutrition, mild ([place]) 02/02/2022   GERD (gastroesophageal reflux disease) 67/61/9509   Chronic systolic CHF (congestive heart failure) ([place]) 08/14/2019   LBBB (left bundle branch block) 08/13/2019   Problem with vascular access 08/13/2019   Dialysis AV fistula malfunction ([place]) 08/12/2019   ESRD (end stage renal disease) ([place]) 09/20/2018   DM2 (diabetes mellitus, type 2) ([place]) 09/20/2018   HTN (hypertension) 09/20/2018   Past Medical History:  Diagnosis Date   CHF (congestive heart failure) ([place])    ECHO 02/05/22  EF 45-50%, DD indeterminent   Diabetes mellitus without complication ([place])    type 2   ESRD on hemodialysis ([place])    mon wed fri   Hypertension    Kidney failure    Myocardial infarction ([place])    Nonischemic cardiomyopathy ([place])     Family History  Problem Relation Age of Onset   Hypertension Mother     Past Surgical History:  Procedure Laterality Date   A/V FISTULAGRAM N/A 08/15/2019   Procedure: A/V FISTULAGRAM;  Surgeon: [name], MD;  Location: [place] [facility];  Service: Cardiovascular;  Laterality: N/A;   AMPUTATION Right 02/02/2022   Procedure: AMPUTATION 5th RAY;  Surgeon: Blenda Mounts,  Wells Guiles, MD;  Location: [place];  Service: Podiatry;  Laterality: Right;   AMPUTATION Right 05/23/2022   Procedure: RIGHT BELOW KNEE AMPUTATION;  Surgeon: [name], MD;  Location: [place];  Service: Orthopedics;  Laterality: Right;   AMPUTATION Right 07/04/2022   Procedure: REVISION AMPUTATION BELOW KNEE;  Surgeon: [name], MD;  Location: [place];  Service: Orthopedics;  Laterality: Right;   APPLICATION OF WOUND VAC  05/23/2022   Procedure: APPLICATION OF WOUND VAC;  Surgeon: [name], MD;  Location: [place];  Service: Orthopedics;;   AV FISTULA PLACEMENT     CARDIAC CATHETERIZATION  02/17/2018   LHC  02/17/18 ([name]): 30% pLAD, 30% dRCA, LVEDP 25-30, LVEF 25%.     CATARACT EXTRACTION W/PHACO Right 03/29/2020   Procedure: CATARACT EXTRACTION PHACO AND INTRAOCULAR LENS PLACEMENT ([place])  RIGHT DIABETIC;  Surgeon: [name], MD;  Location: [facility];  Service: Ophthalmology;  Laterality: Right;  Lot # [id number] H [place]: 02:30.9 AP% 9.5% CDE: 14.38   EYE SURGERY Right    cataract removed   I & D EXTREMITY Right 02/02/2022   Procedure: IRRIGATION AND DEBRIDEMENT RIGHT FOOT;  Surgeon: [name], MD;  Location: [place];  Service: Podiatry;  Laterality: Right;   INCISION AND DRAINAGE OF WOUND Right 02/08/2022   Procedure: IRRIGATION AND DEBRIDEMENT WOUND;  Surgeon: [name], DPM;  Location: [place];  Service: Podiatry;  Laterality: Right;  with removal of infected bone    INSERTION OF DIALYSIS CATHETER  08/13/2019   Procedure: Insertion Of Dialysis Catheter;  Surgeon: [name], MD;  Location: [place];  Service: Vascular;;   INSERTION OF DIALYSIS CATHETER Left 07/19/2020   Procedure: INSERTION OF DIALYSIS CATHETER;  Surgeon: [name], MD;  Location: [place];  Service: Vascular;  Laterality: Left;   IRRIGATION AND DEBRIDEMENT ABSCESS     PERIPHERAL VASCULAR BALLOON ANGIOPLASTY  08/15/2019   Procedure: PERIPHERAL VASCULAR BALLOON ANGIOPLASTY;  Surgeon: [name], MD;  Location: [place] [facility];  Service: Cardiovascular;;   PERIPHERAL VASCULAR BALLOON ANGIOPLASTY Left 02/06/2022   Procedure: PERIPHERAL VASCULAR BALLOON ANGIOPLASTY;  Surgeon: [name], MD;  Location: [place] [facility];  Service: Cardiovascular;  Laterality: Left;   REVISON OF ARTERIOVENOUS FISTULA Left 07/19/2020   Procedure: REVISON/PLICATION OF ARTERIOVENOUS FISTULA LEFT;  Surgeon: [name], MD;  Location: [place] [facility];  Service: Vascular;  Laterality: Left;   THROMBECTOMY AND REVISION OF ARTERIOVENTOUS (AV) GORETEX  GRAFT Left 08/13/2019   Procedure:  THROMBECTOMY OF LEFT ARM ARTERIOVENTOUS (AV) FISTULA;  Surgeon: [name], MD;  [facility];  Service: Vascular;  Laterality: Left;   TOE AMPUTATION     Social History   Occupational History   Not on file  Tobacco Use   Smoking status: Never   Smokeless tobacco: Never  Vaping Use   Vaping Use: Never used  Substance and Sexual Activity   Alcohol use: Not Currently   Drug use: Never   Sexual activity: Not on file

## 2022-08-27 ENCOUNTER — Ambulatory Visit (INDEPENDENT_AMBULATORY_CARE_PROVIDER_SITE_OTHER): Payer: Medicare Other | Admitting: Family

## 2022-08-27 ENCOUNTER — Encounter: Payer: Self-pay | Admitting: Family

## 2022-08-27 DIAGNOSIS — Z89511 Acquired absence of right leg below knee: Secondary | ICD-10-CM

## 2022-08-27 DIAGNOSIS — S88111A Complete traumatic amputation at level between knee and ankle, right lower leg, initial encounter: Secondary | ICD-10-CM

## 2022-08-27 NOTE — Progress Notes (Signed)
Post-Op Visit Note   Patient: [name] MORRISH Sr.           Date of Birth: 05-20-1961           MRN: [id number] Visit Date: 08/27/2022 PCP: Pcp, No  Chief Complaint:  Chief Complaint  Patient presents with   Right Leg - Follow-up    06/17/22 revision right BKA kerecis graft     HPI:  HPI The patient is a 61 year old gentleman who presents today status post revision right below-knee amputation with Kerecis placement he has been packing open with iodoform at skilled nursing.  Continues to have swelling and moderate yellow pink drainage Ortho Exam On examination of the right residual limb there is marked improvement in the area of dehiscence this is now probing about 1 cm deep with silver nitrate sticks there is scant surrounding maceration continues with edema there is no erythema no warmth no purulence no foul odor  Visit Diagnoses: No diagnosis found.  Plan: Continue packing open with iodoform he will follow-up in the office in 2 more weeks proceed with prosthesis set up would benefit from a smaller shrinker.  Follow-Up Instructions: No follow-ups on file.   Imaging: No results found.  Orders:  No orders of the defined types were placed in this encounter.  No orders of the defined types were placed in this encounter.    PMFS History: Patient Active Problem List   Diagnosis Date Noted   Hyperosmolar hyperglycemic state (HHS) ([place])    Diarrhea    Dehiscence of amputation stump of right lower extremity (HCC)    Abscess of right lower leg    Pressure injury of skin 07/02/2022   Cellulitis of right leg 07/01/2022   CAD (coronary artery disease) 05/24/2022   Leukocytosis 05/24/2022   Below-knee amputation of right lower extremity ([place]) 05/23/2022   Obesity (BMI 30-39.9) 02/19/2022   History of CVA  02/05/2022   Carotid stenosis 02/05/2022   acute on chronic Anemia of chronic renal failure  02/03/2022   Subacute osteomyelitis, right ankle and foot ([place]) 29/47/6546    Acute metabolic encephalopathy 50/35/4656   Protein-calorie malnutrition, mild ([place]) 02/02/2022   GERD (gastroesophageal reflux disease) 81/27/5170   Chronic systolic CHF (congestive heart failure) ([place]) 08/14/2019   LBBB (left bundle branch block) 08/13/2019   Problem with vascular access 08/13/2019   Dialysis AV fistula malfunction ([place]) 08/12/2019   ESRD (end stage renal disease) ([place]) 09/20/2018   DM2 (diabetes mellitus, type 2) ([place]) 09/20/2018   HTN (hypertension) 09/20/2018   Past Medical History:  Diagnosis Date   CHF (congestive heart failure) ([place])    ECHO 02/05/22  EF 45-50%, DD indeterminent   Diabetes mellitus without complication ([place])    type 2   ESRD on hemodialysis ([place])    mon wed fri   Hypertension    Kidney failure    Myocardial infarction ([place])    Nonischemic cardiomyopathy ([place])     Family History  Problem Relation Age of Onset   Hypertension Mother     Past Surgical History:  Procedure Laterality Date   A/V FISTULAGRAM N/A 08/15/2019   Procedure: A/V FISTULAGRAM;  Surgeon: [name], MD;  Location: [facility];  Service: Cardiovascular;  Laterality: N/A;   AMPUTATION Right 02/02/2022   Procedure: AMPUTATION 5th RAY;  Surgeon: [name], MD;  Location: [facility];  Service: Podiatry;  Laterality: Right;   AMPUTATION Right 05/23/2022   Procedure: RIGHT BELOW KNEE AMPUTATION;  Surgeon: Meridee Score  V, MD;  Location: [facility];  Service: Orthopedics;  Laterality: Right;   AMPUTATION Right 07/04/2022   Procedure: REVISION AMPUTATION BELOW KNEE;  Surgeon: [name], MD;  Location: [facility];  Service: Orthopedics;  Laterality: Right;   APPLICATION OF WOUND VAC  05/23/2022   Procedure: APPLICATION OF WOUND VAC;  Surgeon: [name], MD;  Location: [facility];  Service: Orthopedics;;   AV FISTULA PLACEMENT     CARDIAC CATHETERIZATION  02/17/2018   LHC 02/17/18 ([name]): 30% pLAD, 30% dRCA, LVEDP 25-30, LVEF 25%.     CATARACT EXTRACTION  W/PHACO Right 03/29/2020   Procedure: CATARACT EXTRACTION PHACO AND INTRAOCULAR LENS PLACEMENT ([place])  RIGHT DIABETIC;  Surgeon: [name], MD;  Location: [facility];  Service: Ophthalmology;  Laterality: Right;  Lot # [id number] H [place]: 02:30.9 AP% 9.5% CDE: 14.38   EYE SURGERY Right    cataract removed   I & D EXTREMITY Right 02/02/2022   Procedure: IRRIGATION AND DEBRIDEMENT RIGHT FOOT;  Surgeon: [name], MD;  Location: [facility];  Service: Podiatry;  Laterality: Right;   INCISION AND DRAINAGE OF WOUND Right 02/08/2022   Procedure: IRRIGATION AND DEBRIDEMENT WOUND;  Surgeon: [name], DPM;  Location: [place];  Service: Podiatry;  Laterality: Right;  with removal of infected bone    INSERTION OF DIALYSIS CATHETER  08/13/2019   Procedure: Insertion Of Dialysis Catheter;  Surgeon: [name], MD;  Location: [facility];  Service: Vascular;;   INSERTION OF DIALYSIS CATHETER Left 07/19/2020   Procedure: INSERTION OF DIALYSIS CATHETER;  Surgeon: [name], MD;  Location: [facility];  Service: Vascular;  Laterality: Left;   IRRIGATION AND DEBRIDEMENT ABSCESS     PERIPHERAL VASCULAR BALLOON ANGIOPLASTY  08/15/2019   Procedure: PERIPHERAL VASCULAR BALLOON ANGIOPLASTY;  Surgeon: [name], MD;  Location: [facility];  Service: Cardiovascular;;   PERIPHERAL VASCULAR BALLOON ANGIOPLASTY Left 02/06/2022   Procedure: PERIPHERAL VASCULAR BALLOON ANGIOPLASTY;  Surgeon: [name], MD;  Location: [facility];  Service: Cardiovascular;  Laterality: Left;   REVISON OF ARTERIOVENOUS FISTULA Left 07/19/2020   Procedure: REVISON/PLICATION OF ARTERIOVENOUS FISTULA LEFT;  Surgeon: [name], MD;  Location: [facility];  Service: Vascular;  Laterality: Left;   THROMBECTOMY AND REVISION OF ARTERIOVENTOUS (AV) GORETEX  GRAFT Left 08/13/2019   Procedure: THROMBECTOMY OF LEFT ARM ARTERIOVENTOUS (AV) FISTULA;  Surgeon: [name], MD;  [facility];   Service: Vascular;  Laterality: Left;   TOE AMPUTATION     Social History   Occupational History   Not on file  Tobacco Use   Smoking status: Never   Smokeless tobacco: Never  Vaping Use   Vaping Use: Never used  Substance and Sexual Activity   Alcohol use: Not Currently   Drug use: Never   Sexual activity: Not on file

## 2022-09-17 ENCOUNTER — Encounter: Payer: Self-pay | Admitting: Family

## 2022-09-17 ENCOUNTER — Ambulatory Visit (INDEPENDENT_AMBULATORY_CARE_PROVIDER_SITE_OTHER): Payer: Medicare Other | Admitting: Family

## 2022-09-17 DIAGNOSIS — Z89511 Acquired absence of right leg below knee: Secondary | ICD-10-CM

## 2022-09-17 DIAGNOSIS — S88111A Complete traumatic amputation at level between knee and ankle, right lower leg, initial encounter: Secondary | ICD-10-CM

## 2022-09-17 MED ORDER — SILVER SULFADIAZINE 1 % EX CREA: 1.0000 | TOPICAL_CREAM | Freq: Every day | CUTANEOUS | 0 refills | Status: DC

## 2022-09-17 NOTE — Progress Notes (Signed)
Post-Op Visit Note   Patient: [name] GRIESHABER Sr.           Date of Birth: Jun 04, 1961           MRN: [id number] Visit Date: 09/17/2022 PCP: Pcp, No  Chief Complaint:  Chief Complaint  Patient presents with   Right Leg - Routine Post Op    06/17/22 revision right BKA kerecis graft     HPI:  HPI The patient is a 61 year old gentleman seen status post revision right below-knee amputation he has been slow to heal the remaining open area is much improved he has been having daily Dial soap cleansing and dry dressings Ortho Exam On examination of the right residual limb the area of previous dehiscence is now 1 mm in diameter 1 cm deep with no probing to bone there is scant serosanguineous drainage there is minimal edema he does have some dermatitis and skin breakdown over the anterior shin there is no erythema warmth no sign of infection  Visit Diagnoses: No diagnosis found.  Plan: Continue daily Dial soap cleansing Silvadene dressings over the dermatitis and areas of skin breakdown.  Ace for compression follow-up in the office in 3 weeks.   May proceed with prosthesis set up.  Follow-Up Instructions: No follow-ups on file.   Imaging: No results found.  Orders:  No orders of the defined types were placed in this encounter.  No orders of the defined types were placed in this encounter.    PMFS History: Patient Active Problem List   Diagnosis Date Noted   Hyperosmolar hyperglycemic state (HHS) ([place])    Diarrhea    Dehiscence of amputation stump of right lower extremity (HCC)    Abscess of right lower leg    Pressure injury of skin 07/02/2022   Cellulitis of right leg 07/01/2022   CAD (coronary artery disease) 05/24/2022   Leukocytosis 05/24/2022   Below-knee amputation of right lower extremity ([place]) 05/23/2022   Obesity (BMI 30-39.9) 02/19/2022   History of CVA  02/05/2022   Carotid stenosis 02/05/2022   acute on chronic Anemia of chronic renal failure  02/03/2022    Subacute osteomyelitis, right ankle and foot ([place]) 62/94/7654   Acute metabolic encephalopathy 65/01/5464   Protein-calorie malnutrition, mild ([place]) 02/02/2022   GERD (gastroesophageal reflux disease) 68/10/7516   Chronic systolic CHF (congestive heart failure) ([place]) 08/14/2019   LBBB (left bundle branch block) 08/13/2019   Problem with vascular access 08/13/2019   Dialysis AV fistula malfunction ([place]) 08/12/2019   ESRD (end stage renal disease) ([place]) 09/20/2018   DM2 (diabetes mellitus, type 2) ([place]) 09/20/2018   HTN (hypertension) 09/20/2018   Past Medical History:  Diagnosis Date   CHF (congestive heart failure) ([place])    ECHO 02/05/22  EF 45-50%, DD indeterminent   Diabetes mellitus without complication ([place])    type 2   ESRD on hemodialysis ([place])    mon wed fri   Hypertension    Kidney failure    Myocardial infarction ([place])    Nonischemic cardiomyopathy ([place])     Family History  Problem Relation Age of Onset   Hypertension Mother     Past Surgical History:  Procedure Laterality Date   A/V FISTULAGRAM N/A 08/15/2019   Procedure: A/V FISTULAGRAM;  Surgeon: [name], MD;  Location: [facility];  Service: Cardiovascular;  Laterality: N/A;   AMPUTATION Right 02/02/2022   Procedure: AMPUTATION 5th RAY;  Surgeon: [name], MD;  Location: [facility];  Service: Podiatry;  Laterality: Right;   AMPUTATION Right 05/23/2022   Procedure: RIGHT BELOW KNEE AMPUTATION;  Surgeon: [name], MD;  Location: [facility];  Service: Orthopedics;  Laterality: Right;   AMPUTATION Right 07/04/2022   Procedure: REVISION AMPUTATION BELOW KNEE;  Surgeon: [name], MD;  Location: [facility];  Service: Orthopedics;  Laterality: Right;   APPLICATION OF WOUND VAC  05/23/2022   Procedure: APPLICATION OF WOUND VAC;  Surgeon: [name], MD;  Location: [facility];  Service: Orthopedics;;   AV FISTULA PLACEMENT     CARDIAC CATHETERIZATION  02/17/2018   LHC 02/17/18 ([name]): 30%  pLAD, 30% dRCA, LVEDP 25-30, LVEF 25%.     CATARACT EXTRACTION W/PHACO Right 03/29/2020   Procedure: CATARACT EXTRACTION PHACO AND INTRAOCULAR LENS PLACEMENT ([place])  RIGHT DIABETIC;  Surgeon: [name], MD;  Location: [facility];  Service: Ophthalmology;  Laterality: Right;  Lot # [id number] H [place]: 02:30.9 AP% 9.5% CDE: 14.38   EYE SURGERY Right    cataract removed   I & D EXTREMITY Right 02/02/2022   Procedure: IRRIGATION AND DEBRIDEMENT RIGHT FOOT;  Surgeon: [name], MD;  Location: [facility];  Service: Podiatry;  Laterality: Right;   INCISION AND DRAINAGE OF WOUND Right 02/08/2022   Procedure: IRRIGATION AND DEBRIDEMENT WOUND;  Surgeon: [name], DPM;  Location: [facility];  Service: Podiatry;  Laterality: Right;  with removal of infected bone    INSERTION OF DIALYSIS CATHETER  08/13/2019   Procedure: Insertion Of Dialysis Catheter;  Surgeon: [name], MD;  Location: [facility];  Service: Vascular;;   INSERTION OF DIALYSIS CATHETER Left 07/19/2020   Procedure: INSERTION OF DIALYSIS CATHETER;  Surgeon: [name], MD;  Location: [facility];  Service: Vascular;  Laterality: Left;   IRRIGATION AND DEBRIDEMENT ABSCESS     PERIPHERAL VASCULAR BALLOON ANGIOPLASTY  08/15/2019   Procedure: PERIPHERAL VASCULAR BALLOON ANGIOPLASTY;  Surgeon: [name], MD;  Location: [facility];  Service: Cardiovascular;;   PERIPHERAL VASCULAR BALLOON ANGIOPLASTY Left 02/06/2022   Procedure: PERIPHERAL VASCULAR BALLOON ANGIOPLASTY;  Surgeon: [name], MD;  Location: [facility];  Service: Cardiovascular;  Laterality: Left;   REVISON OF ARTERIOVENOUS FISTULA Left 07/19/2020   Procedure: REVISON/PLICATION OF ARTERIOVENOUS FISTULA LEFT;  Surgeon: [name], MD;  Location: [facility];  Service: Vascular;  Laterality: Left;   THROMBECTOMY AND REVISION OF ARTERIOVENTOUS (AV) GORETEX  GRAFT Left 08/13/2019   Procedure: THROMBECTOMY OF LEFT ARM ARTERIOVENTOUS (AV)  FISTULA;  Surgeon: [name], MD;  [facility];  Service: Vascular;  Laterality: Left;   TOE AMPUTATION     Social History   Occupational History   Not on file  Tobacco Use   Smoking status: Never   Smokeless tobacco: Never  Vaping Use   Vaping Use: Never used  Substance and Sexual Activity   Alcohol use: Not Currently   Drug use: Never   Sexual activity: Not on file

## 2022-10-08 ENCOUNTER — Ambulatory Visit (INDEPENDENT_AMBULATORY_CARE_PROVIDER_SITE_OTHER): Payer: Medicare Other | Admitting: Family

## 2022-10-08 ENCOUNTER — Encounter: Payer: Self-pay | Admitting: Family

## 2022-10-08 DIAGNOSIS — Z89511 Acquired absence of right leg below knee: Secondary | ICD-10-CM

## 2022-10-08 DIAGNOSIS — S88111A Complete traumatic amputation at level between knee and ankle, right lower leg, initial encounter: Secondary | ICD-10-CM

## 2022-10-08 NOTE — Progress Notes (Signed)
Post-Op Visit Note   Patient: [name] LIENHARD Sr.           Date of Birth: Jan 18, 1961           MRN: [id number] Visit Date: 10/08/2022 PCP: Pcp, No  Chief Complaint: No chief complaint on file.   HPI:  HPI The patient is a 61 year old gentleman seen status post revision right below-knee amputation which was slow to heal.  He is currently residing at skilled nursing.  Voices strong desire to proceed with prosthesis set up.  Patient is a new right transtibial  amputee.  Patient's current comorbidities are not expected to impact the ability to function with the prescribed prosthesis. Patient verbally communicates a strong desire to use a prosthesis. Patient currently requires mobility aids to ambulate without a prosthesis.  Expects not to use mobility aids with a new prosthesis.  Patient is a K2 level ambulator that will use a prosthesis to walk around their home and the community over low level environmental barriers.     Ortho Exam On examination of the right residual limb the incision is well-healed there is no gaping drainage erythema no sign of infection he does have some scattered abrasions over the anterior shin these are superficial and filled in with granulation.  Visit Diagnoses: No diagnosis found.  Plan: Given an order today to proceed with prosthesis set up.  May use dry dressings over the abrasions no longer needs to use an Ace wrap may wear shrinker with direct skin contact once obtained.  Follow-Up Instructions: No follow-ups on file.   Imaging: No results found.  Orders:  No orders of the defined types were placed in this encounter.  No orders of the defined types were placed in this encounter.    PMFS History: Patient Active Problem List   Diagnosis Date Noted   Hyperosmolar hyperglycemic state (HHS) ([place])    Diarrhea    Dehiscence of amputation stump of right lower extremity (HCC)    Abscess of right lower leg    Pressure injury of skin 07/02/2022    Cellulitis of right leg 07/01/2022   CAD (coronary artery disease) 05/24/2022   Leukocytosis 05/24/2022   Below-knee amputation of right lower extremity ([place]) 05/23/2022   Obesity (BMI 30-39.9) 02/19/2022   History of CVA  02/05/2022   Carotid stenosis 02/05/2022   acute on chronic Anemia of chronic renal failure  02/03/2022   Subacute osteomyelitis, right ankle and foot ([place]) 70/26/3785   Acute metabolic encephalopathy 88/50/2774   Protein-calorie malnutrition, mild ([place]) 02/02/2022   GERD (gastroesophageal reflux disease) 12/87/8676   Chronic systolic CHF (congestive heart failure) ([place]) 08/14/2019   LBBB (left bundle branch block) 08/13/2019   Problem with vascular access 08/13/2019   Dialysis AV fistula malfunction ([place]) 08/12/2019   ESRD (end stage renal disease) ([place]) 09/20/2018   DM2 (diabetes mellitus, type 2) ([place]) 09/20/2018   HTN (hypertension) 09/20/2018   Past Medical History:  Diagnosis Date   CHF (congestive heart failure) ([place])    ECHO 02/05/22  EF 45-50%, DD indeterminent   Diabetes mellitus without complication ([place])    type 2   ESRD on hemodialysis ([place])    mon wed fri   Hypertension    Kidney failure    Myocardial infarction ([place])    Nonischemic cardiomyopathy ([place])     Family History  Problem Relation Age of Onset   Hypertension Mother     Past Surgical History:  Procedure Laterality Date   A/V FISTULAGRAM N/A  08/15/2019   Procedure: A/V FISTULAGRAM;  Surgeon: [name], MD;  Location: [facility];  Service: Cardiovascular;  Laterality: N/A;   AMPUTATION Right 02/02/2022   Procedure: AMPUTATION 5th RAY;  Surgeon: [name], MD;  Location: [facility];  Service: Podiatry;  Laterality: Right;   AMPUTATION Right 05/23/2022   Procedure: RIGHT BELOW KNEE AMPUTATION;  Surgeon: [name], MD;  Location: [facility];  Service: Orthopedics;  Laterality: Right;   AMPUTATION Right 07/04/2022   Procedure: REVISION AMPUTATION BELOW KNEE;   Surgeon: [name], MD;  Location: [place] [place];  Service: Orthopedics;  Laterality: Right;   APPLICATION OF WOUND VAC  05/23/2022   Procedure: APPLICATION OF WOUND VAC;  Surgeon: [name], MD;  Location: [place];  Service: Orthopedics;;   AV FISTULA PLACEMENT     CARDIAC CATHETERIZATION  02/17/2018   LHC 02/17/18 ([name]): 30% pLAD, 30% dRCA, LVEDP 25-30, LVEF 25%.     CATARACT EXTRACTION W/PHACO Right 03/29/2020   Procedure: CATARACT EXTRACTION PHACO AND INTRAOCULAR LENS PLACEMENT ([place])  RIGHT DIABETIC;  Surgeon: [name], MD;  Location: [facility];  Service: Ophthalmology;  Laterality: Right;  Lot # [id number] H [place]: 02:30.9 AP% 9.5% CDE: 14.38   EYE SURGERY Right    cataract removed   I & D EXTREMITY Right 02/02/2022   Procedure: IRRIGATION AND DEBRIDEMENT RIGHT FOOT;  Surgeon: [name], MD;  Location: [place];  Service: Podiatry;  Laterality: Right;   INCISION AND DRAINAGE OF WOUND Right 02/08/2022   Procedure: IRRIGATION AND DEBRIDEMENT WOUND;  Surgeon: [name], DPM;  Location: [place];  Service: Podiatry;  Laterality: Right;  with removal of infected bone    INSERTION OF DIALYSIS CATHETER  08/13/2019   Procedure: Insertion Of Dialysis Catheter;  Surgeon: [name], MD;  Location: [facility];  Service: Vascular;;   INSERTION OF DIALYSIS CATHETER Left 07/19/2020   Procedure: INSERTION OF DIALYSIS CATHETER;  Surgeon: [name], MD;  Location: [place];  Service: Vascular;  Laterality: Left;   IRRIGATION AND DEBRIDEMENT ABSCESS     PERIPHERAL VASCULAR BALLOON ANGIOPLASTY  08/15/2019   Procedure: PERIPHERAL VASCULAR BALLOON ANGIOPLASTY;  Surgeon: [name], MD;  Location: [facility];  Service: Cardiovascular;;   PERIPHERAL VASCULAR BALLOON ANGIOPLASTY Left 02/06/2022   Procedure: PERIPHERAL VASCULAR BALLOON ANGIOPLASTY;  Surgeon: [name], MD;  Location: [facility];  Service: Cardiovascular;  Laterality: Left;    REVISON OF ARTERIOVENOUS FISTULA Left 07/19/2020   Procedure: REVISON/PLICATION OF ARTERIOVENOUS FISTULA LEFT;  Surgeon: [name], MD;  Location: [facility];  Service: Vascular;  Laterality: Left;   THROMBECTOMY AND REVISION OF ARTERIOVENTOUS (AV) GORETEX  GRAFT Left 08/13/2019   Procedure: THROMBECTOMY OF LEFT ARM ARTERIOVENTOUS (AV) FISTULA;  Surgeon: [name], MD;  [facility];  Service: Vascular;  Laterality: Left;   TOE AMPUTATION     Social History   Occupational History   Not on file  Tobacco Use   Smoking status: Never   Smokeless tobacco: Never  Vaping Use   Vaping Use: Never used  Substance and Sexual Activity   Alcohol use: Not Currently   Drug use: Never   Sexual activity: Not on file

## 2022-10-18 ENCOUNTER — Other Ambulatory Visit: Payer: Self-pay

## 2022-10-18 ENCOUNTER — Inpatient Hospital Stay (HOSPITAL_COMMUNITY)
Admission: EM | Admit: 2022-10-18 | Discharge: 2022-10-20 | DRG: 291 | Disposition: A | Discharge status: Skilled Nursing Facility | Payer: Medicare Other | Source: Skilled Nursing Facility | Attending: Internal Medicine | Admitting: Internal Medicine

## 2022-10-18 ENCOUNTER — Encounter (HOSPITAL_COMMUNITY): Payer: Self-pay

## 2022-10-18 ENCOUNTER — Emergency Department (HOSPITAL_COMMUNITY): Payer: Medicare Other

## 2022-10-18 DIAGNOSIS — E1151 Type 2 diabetes mellitus with diabetic peripheral angiopathy without gangrene: Secondary | ICD-10-CM | POA: Diagnosis present

## 2022-10-18 DIAGNOSIS — I132 Hypertensive heart and chronic kidney disease with heart failure and with stage 5 chronic kidney disease, or end stage renal disease: Secondary | ICD-10-CM | POA: Diagnosis not present

## 2022-10-18 DIAGNOSIS — I447 Left bundle-branch block, unspecified: Secondary | ICD-10-CM | POA: Diagnosis present

## 2022-10-18 DIAGNOSIS — E119 Type 2 diabetes mellitus without complications: Secondary | ICD-10-CM

## 2022-10-18 DIAGNOSIS — Z992 Dependence on renal dialysis: Secondary | ICD-10-CM

## 2022-10-18 DIAGNOSIS — Z794 Long term (current) use of insulin: Secondary | ICD-10-CM

## 2022-10-18 DIAGNOSIS — I10 Essential (primary) hypertension: Secondary | ICD-10-CM | POA: Diagnosis present

## 2022-10-18 DIAGNOSIS — Z993 Dependence on wheelchair: Secondary | ICD-10-CM

## 2022-10-18 DIAGNOSIS — D631 Anemia in chronic kidney disease: Secondary | ICD-10-CM | POA: Diagnosis present

## 2022-10-18 DIAGNOSIS — Z6830 Body mass index (BMI) 30.0-30.9, adult: Secondary | ICD-10-CM

## 2022-10-18 DIAGNOSIS — I5023 Acute on chronic systolic (congestive) heart failure: Secondary | ICD-10-CM | POA: Diagnosis not present

## 2022-10-18 DIAGNOSIS — S88111A Complete traumatic amputation at level between knee and ankle, right lower leg, initial encounter: Secondary | ICD-10-CM | POA: Diagnosis present

## 2022-10-18 DIAGNOSIS — Z1152 Encounter for screening for COVID-19: Secondary | ICD-10-CM

## 2022-10-18 DIAGNOSIS — I2510 Atherosclerotic heart disease of native coronary artery without angina pectoris: Secondary | ICD-10-CM | POA: Diagnosis present

## 2022-10-18 DIAGNOSIS — E88819 Insulin resistance, unspecified: Secondary | ICD-10-CM | POA: Diagnosis present

## 2022-10-18 DIAGNOSIS — Z89511 Acquired absence of right leg below knee: Secondary | ICD-10-CM

## 2022-10-18 DIAGNOSIS — I252 Old myocardial infarction: Secondary | ICD-10-CM

## 2022-10-18 DIAGNOSIS — I428 Other cardiomyopathies: Secondary | ICD-10-CM | POA: Diagnosis present

## 2022-10-18 DIAGNOSIS — J449 Chronic obstructive pulmonary disease, unspecified: Secondary | ICD-10-CM | POA: Diagnosis present

## 2022-10-18 DIAGNOSIS — I5022 Chronic systolic (congestive) heart failure: Secondary | ICD-10-CM | POA: Diagnosis present

## 2022-10-18 DIAGNOSIS — J9601 Acute respiratory failure with hypoxia: Secondary | ICD-10-CM | POA: Diagnosis present

## 2022-10-18 DIAGNOSIS — Z8249 Family history of ischemic heart disease and other diseases of the circulatory system: Secondary | ICD-10-CM

## 2022-10-18 DIAGNOSIS — E1122 Type 2 diabetes mellitus with diabetic chronic kidney disease: Secondary | ICD-10-CM | POA: Diagnosis present

## 2022-10-18 DIAGNOSIS — Z882 Allergy status to sulfonamides status: Secondary | ICD-10-CM

## 2022-10-18 DIAGNOSIS — I509 Heart failure, unspecified: Secondary | ICD-10-CM | POA: Insufficient documentation

## 2022-10-18 DIAGNOSIS — E669 Obesity, unspecified: Secondary | ICD-10-CM | POA: Diagnosis present

## 2022-10-18 DIAGNOSIS — Z8673 Personal history of transient ischemic attack (TIA), and cerebral infarction without residual deficits: Secondary | ICD-10-CM

## 2022-10-18 DIAGNOSIS — J189 Pneumonia, unspecified organism: Principal | ICD-10-CM

## 2022-10-18 DIAGNOSIS — E785 Hyperlipidemia, unspecified: Secondary | ICD-10-CM | POA: Diagnosis present

## 2022-10-18 DIAGNOSIS — Z7982 Long term (current) use of aspirin: Secondary | ICD-10-CM

## 2022-10-18 DIAGNOSIS — Z79899 Other long term (current) drug therapy: Secondary | ICD-10-CM

## 2022-10-18 DIAGNOSIS — M898X9 Other specified disorders of bone, unspecified site: Secondary | ICD-10-CM | POA: Diagnosis present

## 2022-10-18 DIAGNOSIS — N186 End stage renal disease: Secondary | ICD-10-CM | POA: Diagnosis present

## 2022-10-18 LAB — CBC
HCT: 37 % — ABNORMAL LOW (ref 39.0–52.0)
Hemoglobin: 11.9 g/dL — ABNORMAL LOW (ref 13.0–17.0)
MCH: 27.8 pg (ref 26.0–34.0)
MCHC: 32.2 g/dL (ref 30.0–36.0)
MCV: 86.4 fL (ref 80.0–100.0)
Platelets: 212 10*3/uL (ref 150–400)
RBC: 4.28 MIL/uL (ref 4.22–5.81)
RDW: 23.1 % — ABNORMAL HIGH (ref 11.5–15.5)
WBC: 11.3 10*3/uL — ABNORMAL HIGH (ref 4.0–10.5)
nRBC: 0 % (ref 0.0–0.2)

## 2022-10-18 LAB — BASIC METABOLIC PANEL
Anion gap: 14 (ref 5–15)
BUN: 37 mg/dL — ABNORMAL HIGH (ref 6–20)
CO2: 27 mmol/L (ref 22–32)
Calcium: 9.9 mg/dL (ref 8.9–10.3)
Chloride: 96 mmol/L — ABNORMAL LOW (ref 98–111)
Creatinine, Ser: 9.47 mg/dL — ABNORMAL HIGH (ref 0.61–1.24)
GFR, Estimated: 6 mL/min — ABNORMAL LOW (ref >60)
Glucose, Bld: 155 mg/dL — ABNORMAL HIGH (ref 70–99)
Potassium: 4.8 mmol/L (ref 3.5–5.1)
Sodium: 137 mmol/L (ref 135–145)

## 2022-10-18 LAB — RESP PANEL BY RT-PCR (FLU A&B, COVID) ARPGX2
Influenza A by PCR: NEGATIVE
Influenza B by PCR: NEGATIVE
SARS Coronavirus 2 by RT PCR: NEGATIVE

## 2022-10-18 LAB — TROPONIN I (HIGH SENSITIVITY)
Troponin I (High Sensitivity): 75 ng/L — ABNORMAL HIGH (ref <18)
Troponin I (High Sensitivity): 82 ng/L — ABNORMAL HIGH (ref <18)

## 2022-10-18 MED ORDER — SODIUM CHLORIDE 0.9% FLUSH
3.0000 mL | Freq: Two times a day (BID) | INTRAVENOUS | Status: DC
  Administered 2022-10-19 – 2022-10-20 (×3): 3 mL via INTRAVENOUS

## 2022-10-18 MED ORDER — GUAIFENESIN 100 MG/5ML PO LIQD
10.0000 mL | ORAL | Status: DC | PRN
  Administered 2022-10-19 – 2022-10-20 (×4): 10 mL via ORAL
  Filled 2022-10-18 (×4): qty 10

## 2022-10-18 MED ORDER — SUCROFERRIC OXYHYDROXIDE 500 MG PO CHEW
1000.0000 mg | CHEWABLE_TABLET | Freq: Three times a day (TID) | ORAL | Status: DC
  Administered 2022-10-19 – 2022-10-20 (×2): 1000 mg via ORAL
  Filled 2022-10-18 (×7): qty 2

## 2022-10-18 MED ORDER — INSULIN GLARGINE-YFGN 100 UNIT/ML ~~LOC~~ SOLN
20.0000 [IU] | Freq: Every day | SUBCUTANEOUS | Status: DC
  Administered 2022-10-19 – 2022-10-20 (×2): 20 [IU] via SUBCUTANEOUS
  Filled 2022-10-18 (×2): qty 0.2

## 2022-10-18 MED ORDER — RENA-VITE PO TABS
1.0000 | ORAL_TABLET | Freq: Every day | ORAL | Status: DC
  Administered 2022-10-19 (×2): 1 via ORAL
  Filled 2022-10-18 (×2): qty 1

## 2022-10-18 MED ORDER — CARVEDILOL 3.125 MG PO TABS
3.1250 mg | ORAL_TABLET | Freq: Two times a day (BID) | ORAL | Status: DC
  Administered 2022-10-19 – 2022-10-20 (×4): 3.125 mg via ORAL
  Filled 2022-10-18 (×4): qty 1

## 2022-10-18 MED ORDER — BENZONATATE 100 MG PO CAPS
200.0000 mg | ORAL_CAPSULE | Freq: Three times a day (TID) | ORAL | Status: DC | PRN
  Administered 2022-10-19: 200 mg via ORAL
  Filled 2022-10-18: qty 2

## 2022-10-18 MED ORDER — PANTOPRAZOLE SODIUM 40 MG PO TBEC
40.0000 mg | DELAYED_RELEASE_TABLET | Freq: Every day | ORAL | Status: DC
  Administered 2022-10-19 – 2022-10-20 (×2): 40 mg via ORAL
  Filled 2022-10-18 (×2): qty 1

## 2022-10-18 MED ORDER — SODIUM CHLORIDE 0.9 % IV SOLN: 250.0000 mL | INTRAVENOUS | Status: DC | PRN

## 2022-10-18 MED ORDER — NEPRO/CARBSTEADY PO LIQD
237.0000 mL | Freq: Two times a day (BID) | ORAL | Status: DC
  Administered 2022-10-20 (×2): 237 mL via ORAL

## 2022-10-18 MED ORDER — ACETAMINOPHEN 325 MG PO TABS: 325.0000 mg | ORAL_TABLET | Freq: Four times a day (QID) | ORAL | Status: DC | PRN

## 2022-10-18 MED ORDER — FAMOTIDINE 20 MG PO TABS
10.0000 mg | ORAL_TABLET | Freq: Every day | ORAL | Status: DC
  Administered 2022-10-19 (×2): 10 mg via ORAL
  Filled 2022-10-18 (×2): qty 1

## 2022-10-18 MED ORDER — PROSOURCE PLUS PO LIQD
30.0000 mL | Freq: Two times a day (BID) | ORAL | Status: DC
  Administered 2022-10-19 – 2022-10-20 (×4): 30 mL via ORAL
  Filled 2022-10-18 (×4): qty 30

## 2022-10-18 MED ORDER — IPRATROPIUM-ALBUTEROL 0.5-2.5 (3) MG/3ML IN SOLN: 3.0000 mL | RESPIRATORY_TRACT | Status: DC | PRN

## 2022-10-18 MED ORDER — SODIUM CHLORIDE 0.9 % IV SOLN
2.0000 g | Freq: Once | INTRAVENOUS | Status: AC
  Administered 2022-10-19: 2 g via INTRAVENOUS
  Filled 2022-10-18: qty 20

## 2022-10-18 MED ORDER — HEPARIN SODIUM (PORCINE) 1000 UNIT/ML IJ SOLN
INTRAMUSCULAR | Status: AC
  Administered 2022-10-18: 1000 [IU]
  Filled 2022-10-18: qty 6

## 2022-10-18 MED ORDER — ONDANSETRON HCL 4 MG PO TABS: 4.0000 mg | ORAL_TABLET | Freq: Four times a day (QID) | ORAL | Status: DC | PRN

## 2022-10-18 MED ORDER — SODIUM CHLORIDE 0.9 % IV SOLN
500.0000 mg | Freq: Once | INTRAVENOUS | Status: DC
  Filled 2022-10-18: qty 5

## 2022-10-18 MED ORDER — ASPIRIN 81 MG PO TBEC
81.0000 mg | DELAYED_RELEASE_TABLET | Freq: Every day | ORAL | Status: DC
  Administered 2022-10-19 – 2022-10-20 (×2): 81 mg via ORAL
  Filled 2022-10-18 (×2): qty 1

## 2022-10-18 MED ORDER — HEPARIN SODIUM (PORCINE) 5000 UNIT/ML IJ SOLN
5000.0000 [IU] | Freq: Two times a day (BID) | INTRAMUSCULAR | Status: DC
  Administered 2022-10-19 – 2022-10-20 (×4): 5000 [IU] via SUBCUTANEOUS
  Filled 2022-10-18 (×4): qty 1

## 2022-10-18 MED ORDER — ONDANSETRON HCL 4 MG/2ML IJ SOLN: 4.0000 mg | Freq: Four times a day (QID) | INTRAMUSCULAR | Status: DC | PRN

## 2022-10-18 MED ORDER — FUROSEMIDE 10 MG/ML IJ SOLN
60.0000 mg | Freq: Once | INTRAMUSCULAR | Status: AC
  Administered 2022-10-18: 60 mg via INTRAVENOUS
  Filled 2022-10-18: qty 6

## 2022-10-18 MED ORDER — INSULIN ASPART 100 UNIT/ML IJ SOLN
0.0000 [IU] | Freq: Three times a day (TID) | INTRAMUSCULAR | Status: DC
  Administered 2022-10-19 – 2022-10-20 (×2): 1 [IU] via SUBCUTANEOUS
  Administered 2022-10-20: 2 [IU] via SUBCUTANEOUS

## 2022-10-18 MED ORDER — SENNOSIDES-DOCUSATE SODIUM 8.6-50 MG PO TABS: 1.0000 | ORAL_TABLET | Freq: Every evening | ORAL | Status: DC | PRN

## 2022-10-18 MED ORDER — LOPERAMIDE HCL 2 MG PO CAPS: 2.0000 mg | ORAL_CAPSULE | ORAL | Status: DC | PRN

## 2022-10-18 MED ORDER — HEPARIN SODIUM (PORCINE) 1000 UNIT/ML DIALYSIS
6000.0000 [IU] | Freq: Once | INTRAMUSCULAR | Status: DC
  Filled 2022-10-18: qty 6

## 2022-10-18 MED ORDER — CHLORHEXIDINE GLUCONATE CLOTH 2 % EX PADS
6.0000 | MEDICATED_PAD | Freq: Every day | CUTANEOUS | Status: DC
  Administered 2022-10-19 – 2022-10-20 (×2): 6 via TOPICAL

## 2022-10-18 MED ORDER — ACETAMINOPHEN 325 MG PO TABS: 650.0000 mg | ORAL_TABLET | ORAL | Status: DC | PRN

## 2022-10-18 MED ORDER — OXYCODONE-ACETAMINOPHEN 5-325 MG PO TABS
1.0000 | ORAL_TABLET | ORAL | Status: DC | PRN
  Administered 2022-10-19: 1 via ORAL
  Filled 2022-10-18 (×2): qty 1

## 2022-10-18 MED ORDER — SODIUM CHLORIDE 0.9% FLUSH: 3.0000 mL | INTRAVENOUS | Status: DC | PRN

## 2022-10-18 NOTE — Progress Notes (Signed)
   10/18/22 2100  Vitals  Temp 98.7 F (37.1 C)  BP (!) 140/62  Pulse Rate 90  ECG Heart Rate 90  Resp 20  Post Treatment  Dialyzer Clearance Lightly streaked  Duration of HD Treatment -hour(s) 3.5 hour(s)  Liters Processed 84  Fluid Removed (mL) 3500 mL  Tolerated HD Treatment Yes  AVG/AVF Arterial Site Held (minutes) 7 minutes  AVG/AVF Venous Site Held (minutes) 7 minutes   TX fin. W/o difficulty.

## 2022-10-18 NOTE — ED Notes (Signed)
Report has been called to the nurse  nicole bender rn

## 2022-10-18 NOTE — Progress Notes (Signed)
Trop trending 70>82, no chest pain. Elevation of trop likely demanding ischemia from CHF decompensation, no ACS meds indicated.

## 2022-10-18 NOTE — Progress Notes (Signed)
Patient arrived to room 3E10 from HD.  Assessment complete, VS obtained, and Admission database began.

## 2022-10-18 NOTE — ED Provider Triage Note (Signed)
Emergency Medicine Provider Triage Evaluation Note  [name] Cruel Sr. , a 61 y.o. male  was evaluated in triage.  Pt complains of cough and chest wall pain starting this morning. Roommate just diagnosed with pneumonia. CP all over, worse with coughing, deep breathing, and palpation. Due for dialysis today.   Coming from [facility]  Positive: CP, SOB, cough Negative: fever  Physical Exam  Pulse 95   Temp 99.7 F (37.6 C) (Oral)   SpO2 93%  Gen:   Awake, no distress   Resp:  Normal effort  MSK:   Moves extremities without difficulty  Other:    Medical Decision Making  Medically screening exam initiated at 11:53 AM.  Appropriate orders placed.  [name] Cruel Sr. was informed that the remainder of the evaluation will be completed by another provider, this initial triage assessment does not replace that evaluation, and the importance of remaining in the ED until their evaluation is complete.     [name] Krotzer T, PA-C 10/18/22 1153

## 2022-10-18 NOTE — ED Notes (Signed)
Permit for dialysis  signed by the pt.   Meds that were due has been sent with the pt  thye dialysis staff  reports that they will give the antibiotics  lasix 60 mg was ordered by someone not sure if that's needed since he is being dialyzed   sent up anyway  the pt reports that he still has chest pain  2nd tro should be resulted

## 2022-10-18 NOTE — ED Notes (Signed)
Dialysis called to  tell them report has been called on this pt

## 2022-10-18 NOTE — ED Provider Notes (Signed)
[facility] EMERGENCY DEPARTMENT Provider Note   CSN: [id number] Arrival date & time: 10/18/22  1132     History  Chief Complaint  Patient presents with   Chest Pain    [name] SPITTLER Sr. is a 61 y.o. male.   Chest Pain Patient presents with chest pain shortness of breath cough.  States his roommate was diagnosed with pneumonia.  States he had a cough for a long time but now coughing more.  States anterior chest.  Due for dialysis.  Dialysis was post be done today but did not come because he felt bad.    Past Medical History:  Diagnosis Date   CHF (congestive heart failure) ([place])    ECHO 02/05/22  EF 45-50%, DD indeterminent   Diabetes mellitus without complication ([place])    type 2   ESRD on hemodialysis ([place])    mon wed fri   Hypertension    Kidney failure    Myocardial infarction ([place])    Nonischemic cardiomyopathy ([name])     Home Medications Prior to Admission medications   Medication Sig Start Date End Date Taking? Authorizing Provider  acetaminophen (TYLENOL) 325 MG tablet Take 1-2 tablets (325-650 mg total) by mouth every 6 (six) hours as needed for mild pain (pain score 1-3 or temp > 100.5). 07/08/22   Eugenie Filler, MD  Amino Acids-Protein Hydrolys (FEEDING SUPPLEMENT, PRO-STAT SUGAR FREE 64,) LIQD Take 30 mLs by mouth 2 (two) times daily.    [provider]  Amino Acids-Protein Hydrolys (PRO-STAT AWC) LIQD Take 30 mLs by mouth 2 (two) times daily.    [provider]  amLODipine (NORVASC) 5 MG tablet Take by mouth. 07/15/22   [provider]  amoxicillin-clavulanate (AUGMENTIN) 875-125 MG tablet Take 1 tablet by mouth daily. Patient not taking: Reported on 08/06/2022 07/09/22   [provider]  aspirin EC 81 MG EC tablet Take 1 tablet (81 mg total) by mouth daily. Swallow whole. 02/21/22   Raiford Noble Latif, DO  benzonatate (TESSALON) 200 MG capsule Take 200 mg by mouth See admin instructions. Take 200 mg by  mouth at 8 AM, 2 PM, and 8 PM on Sun/Mon/Wed/Fri and 200 mg at 8 AM and 5 PM every Tues/Thurs/Sat    [provider]  ethyl chloride spray Apply 1 Application topically Every Tuesday,Thursday,and Saturday with dialysis.    [provider]  famotidine (PEPCID) 20 MG tablet Take 20 mg by mouth at bedtime.    [provider]  guaiFENesin (ROBITUSSIN) 100 MG/5ML liquid Take 10 mLs by mouth every 4 (four) hours as needed for cough.    [provider]  insulin glargine (LANTUS) 100 UNIT/ML injection Inject 0.32 mLs (32 Units total) into the skin at bedtime. 07/08/22   Eugenie Filler, MD  ipratropium-albuterol (DUONEB) 0.5-2.5 (3) MG/3ML SOLN Take 3 mLs by nebulization every 4 (four) hours as needed (for wheezing or shortness of breath).    [provider]  loperamide (IMODIUM) 2 MG capsule Take 1 capsule (2 mg total) by mouth as needed for diarrhea or loose stools. 07/08/22   Eugenie Filler, MD  multivitamin (RENA-VIT) TABS tablet Take 1 tablet by mouth at bedtime. 02/20/22   Raiford Noble Latif, DO  Nutritional Supplements (,FEEDING SUPPLEMENT, PROSOURCE PLUS) liquid Take 30 mLs by mouth 2 (two) times daily between meals. Patient not taking: Reported on 05/22/2022 02/21/22   Raiford Noble Latif, DO  Nutritional Supplements (FEEDING SUPPLEMENT, NEPRO CARB STEADY,) LIQD Take 237  mLs by mouth 2 (two) times daily between meals. 02/21/22   Raiford Noble Latif, DO  Nutritional Supplements (NEPRO) LIQD Take by mouth 2 (two) times daily.    [provider]  ondansetron (ZOFRAN) 4 MG tablet Take 4 mg by mouth every 6 (six) hours as needed for nausea or vomiting. 06/30/22   [provider]  oxyCODONE-acetaminophen (PERCOCET) 5-325 MG tablet Take 1 tablet by mouth every 4 (four) hours as needed for severe pain. 07/08/22 07/08/23  Eugenie Filler, MD  pantoprazole (PROTONIX) 40 MG tablet Take 1 tablet (40 mg total) by mouth daily at 12 noon. Patient taking  differently: Take 40 mg by mouth daily before breakfast. 02/21/22   Raiford Noble Latif, DO  senna-docusate (SENOKOT-S) 8.6-50 MG tablet Take 1 tablet by mouth at bedtime as needed for mild constipation. 07/08/22   Eugenie Filler, MD  silver sulfADIAZINE (SILVADENE) 1 % cream Apply 1 Application topically daily. 09/17/22   Suzan Slick, NP  sucroferric oxyhydroxide (VELPHORO) 500 MG chewable tablet Chew 1,000 mg by mouth See admin instructions. Chew 1,000 mg by mouth at 9 AM and 5 PM on daily and an additional 1,000 mg at noon only on Mon/Wed/Fri    [provider]  sucroferric oxyhydroxide (VELPHORO) 500 MG chewable tablet Chew 1,000 mg by mouth See admin instructions. Chew 1,000 mg by mouth one time a day every Mon, Wed, Fri, Sun with lunch    [provider]      Allergies    Sulfa antibiotics    Review of Systems   Review of Systems  Cardiovascular:  Positive for chest pain.    Physical Exam Updated Vital Signs BP (!) 147/76 (BP Location: Right Arm)   Pulse 93   Temp 99.7 F (37.6 C)   Resp (!) 26   Ht 6\' 1"  (1.854 m)   Wt 104.3 kg   SpO2 (!) 88%   BMI 30.34 kg/m  Physical Exam Vitals and nursing note reviewed.  HENT:     Head: Atraumatic.  Cardiovascular:     Rate and Rhythm: Regular rhythm.  Pulmonary:     Effort: Pulmonary effort is normal.  Chest:     Chest wall: Tenderness present.     Comments: Mild tenderness to anterior chest. Abdominal:     Tenderness: There is no abdominal tenderness.  Musculoskeletal:     Right lower leg: No edema.     Left lower leg: No edema.  Skin:    General: Skin is warm.  Neurological:     Mental Status: He is alert.     ED Results / Procedures / Treatments   Labs (all labs ordered are listed, but only abnormal results are displayed) Labs Reviewed  BASIC METABOLIC PANEL - Abnormal; Notable for the following components:      Result Value   Chloride 96 (*)    Glucose, Bld 155 (*)    BUN 37 (*)     Creatinine, Ser 9.47 (*)    GFR, Estimated 6 (*)    All other components within normal limits  CBC - Abnormal; Notable for the following components:   WBC 11.3 (*)    Hemoglobin 11.9 (*)    HCT 37.0 (*)    RDW 23.1 (*)    All other components within normal limits  TROPONIN I (HIGH SENSITIVITY) - Abnormal; Notable for the following components:   Troponin I (High Sensitivity) 75 (*)    All other components within normal limits  TROPONIN I (HIGH SENSITIVITY) - Abnormal; Notable for the following components:   Troponin I (High Sensitivity) 82 (*)    All other components within normal limits  RESP PANEL BY RT-PCR (FLU A&B, COVID) ARPGX2  HEPATITIS B SURFACE ANTIGEN  HEPATITIS B SURFACE ANTIBODY, QUANTITATIVE  PROCALCITONIN    EKG EKG Interpretation  Date/Time:  Saturday October 18 2022 11:50:11 EST Ventricular Rate:  95 PR Interval:  216 QRS Duration: 128 QT Interval:  378 QTC Calculation: 475 R Axis:   -66 Text Interpretation: Sinus rhythm with 1st degree A-V block Left axis deviation Non-specific intra-ventricular conduction block Minimal voltage criteria for LVH, may be normal variant ( Cornell product ) Cannot rule out Anteroseptal infarct , age undetermined T wave abnormality, consider lateral ischemia Abnormal ECG When compared with ECG of 01-Jul-2022 09:56,   Lateral T waves improved from prior. Confirmed by Davonna Belling [phone]) on 10/18/2022 1:45:05 PM  Radiology DG Chest 2 View  Result Date: 10/18/2022 CLINICAL DATA:  Chest pain EXAM: CHEST - 2 VIEW COMPARISON:  07/01/2022 FINDINGS: Stable cardiomegaly. Pulmonary vascular congestion with diffuse interstitial prominence. Trace bilateral pleural effusions. Patchy bibasilar opacities. No pneumothorax. IMPRESSION: 1. Cardiomegaly with pulmonary vascular congestion and interstitial edema. 2. Trace bilateral pleural effusions. Bibasilar opacities may reflect atelectasis or pneumonia. Electronically Signed   By: Davina Poke  D.O.   On: 10/18/2022 12:23    Procedures Procedures    Medications Ordered in ED Medications  cefTRIAXone (ROCEPHIN) 2 g in sodium chloride 0.9 % 100 mL IVPB (has no administration in time range)  azithromycin (ZITHROMAX) 500 mg in sodium chloride 0.9 % 250 mL IVPB (has no administration in time range)  Chlorhexidine Gluconate Cloth 2 % PADS 6 each (has no administration in time range)  furosemide (LASIX) injection 60 mg (has no administration in time range)    ED Course/ Medical Decision Making/ A&P                           Medical Decision Making Risk Decision regarding hospitalization.   Patient with chest pain shortness of breath.  History of dialysis.  Differential diagnosis includes pneumonia, CHF, coronary artery disease.  EKG reassuring.  First troponin elevated at 75.  Has been near this level in the past.  I think likely secondary to noncompliance with dialysis.  Direct cardiac cause felt less likely pre much the same at 82.  Chest x-ray shows pneumonia versus volume overload.  With the cough and some sputum production and pneumonia contact I have given antibiotics.  Sats will dip down to the 80s.  Discussed with Dr. Burnett Sheng from nephrology who will arrange for dialysis.  Also discussed with Dr. Roosevelt Locks who will admit patient.        Final Clinical Impression(s) / ED Diagnoses Final diagnoses:  Community acquired pneumonia, unspecified laterality  End stage renal disease on dialysis [facility])    Rx / DC Orders ED Discharge Orders     None         Davonna Belling, MD 10/18/22 1610

## 2022-10-18 NOTE — H&P (Addendum)
History and Physical    [name] CONSOLE Sr. YNW:[id number] DOB: Sep 20, 1961 DOA: 10/18/2022  PCP: Pcp, No (Confirm with patient/family/NH records and if not entered, this has to be entered at [facility] point of entry) Patient coming from: Home  I have personally briefly reviewed patient's old medical records in [place]  Chief Complaint: Cough, SOB  HPI: [name] LY Sr. is a 61 y.o. male with medical history significant of ESRD on HD TTS, chronic HFrEF with LVEF 45/50%, nonischemic cardiomyopathy, mild aortic stenosis, IDDM, PVD with right BKA, presented with cough and shortness of breath.   Last dialysis 2 days ago.  His symptoms started 2 days ago after hemodialysis, when patient developed dry cough and increasing exertional dyspnea, no chest pain no fever chills.  This morning, even minimal activity trigger significant shortness of breath.  He has not received hemodialysis toda due to shortness of breath.  Patient also reported that his roommate developed cough and shortness of breath last week and was diagnosed with pneumonia and has been taking antibiotics.  ED Course: Low-grade fever 99.7, tachypneic but no tachycardia, O2 saturation 97% on room air.  X-ray showed cardiomegaly and bilateral pleural pulmonary congestion with small bilateral pleural effusion and suspected right lower atelectasis versus pneumonia.  Creatinine 9.4, K4.7, glucose 155, WBC 11.7.  Patient was given ceftriaxone and azithromycin  Review of Systems: As per HPI otherwise 14 point review of systems negative.    Past Medical History:  Diagnosis Date   CHF (congestive heart failure) ([place])    ECHO 02/05/22  EF 45-50%, DD indeterminent   Diabetes mellitus without complication ([place])    type 2   ESRD on hemodialysis ([place])    mon wed fri   Hypertension    Kidney failure    Myocardial infarction ([place])    Nonischemic cardiomyopathy [facility])     Past Surgical History:  Procedure Laterality Date   A/V FISTULAGRAM  N/A 08/15/2019   Procedure: A/V FISTULAGRAM;  Surgeon: [name], MD;  Location: [place] [facility];  Service: Cardiovascular;  Laterality: N/A;   AMPUTATION Right 02/02/2022   Procedure: AMPUTATION 5th RAY;  Surgeon: [name], MD;  Location: [place];  Service: Podiatry;  Laterality: Right;   AMPUTATION Right 05/23/2022   Procedure: RIGHT BELOW KNEE AMPUTATION;  Surgeon: [name], MD;  Location: [place];  Service: Orthopedics;  Laterality: Right;   AMPUTATION Right 07/04/2022   Procedure: REVISION AMPUTATION BELOW KNEE;  Surgeon: [name], MD;  Location: [place];  Service: Orthopedics;  Laterality: Right;   APPLICATION OF WOUND VAC  05/23/2022   Procedure: APPLICATION OF WOUND VAC;  Surgeon: [name], MD;  Location: [place];  Service: Orthopedics;;   AV FISTULA PLACEMENT     CARDIAC CATHETERIZATION  02/17/2018   LHC 02/17/18 ([name]): 30% pLAD, 30% dRCA, LVEDP 25-30, LVEF 25%.     CATARACT EXTRACTION W/PHACO Right 03/29/2020   Procedure: CATARACT EXTRACTION PHACO AND INTRAOCULAR LENS PLACEMENT ([place])  RIGHT DIABETIC;  Surgeon: [name], MD;  Location: [facility];  Service: Ophthalmology;  Laterality: Right;  Lot # [id number] H [place]: 02:30.9 AP% 9.5% CDE: 14.38   EYE SURGERY Right    cataract removed   I & D EXTREMITY Right 02/02/2022   Procedure: IRRIGATION AND DEBRIDEMENT RIGHT FOOT;  Surgeon: [name], MD;  Location: [place];  Service: Podiatry;  Laterality: Right;   INCISION AND DRAINAGE OF WOUND Right 02/08/2022   Procedure: IRRIGATION AND DEBRIDEMENT WOUND;  Surgeon: [name], DPM;  Location: [place];  Service: Podiatry;  Laterality: Right;  with removal of infected bone    INSERTION OF DIALYSIS CATHETER  08/13/2019   Procedure: Insertion Of Dialysis Catheter;  Surgeon: [name], MD;  Location: [place];  Service: Vascular;;   INSERTION OF DIALYSIS CATHETER Left 07/19/2020   Procedure: INSERTION OF DIALYSIS CATHETER;  Surgeon: [name], MD;  Location: [place];  Service: Vascular;  Laterality: Left;   IRRIGATION AND DEBRIDEMENT ABSCESS     PERIPHERAL VASCULAR BALLOON ANGIOPLASTY  08/15/2019   Procedure: PERIPHERAL VASCULAR BALLOON ANGIOPLASTY;  Surgeon: [name], MD;  Location: [place] [facility];  Service: Cardiovascular;;   PERIPHERAL VASCULAR BALLOON ANGIOPLASTY Left 02/06/2022   Procedure: PERIPHERAL VASCULAR BALLOON ANGIOPLASTY;  Surgeon: [name], MD;  Location: [place] [facility];  Service: Cardiovascular;  Laterality: Left;   REVISON OF ARTERIOVENOUS FISTULA Left 07/19/2020   Procedure: REVISON/PLICATION OF ARTERIOVENOUS FISTULA LEFT;  Surgeon: [name], MD;  Location: [place];  Service: Vascular;  Laterality: Left;   THROMBECTOMY AND REVISION OF ARTERIOVENTOUS (AV) GORETEX  GRAFT Left 08/13/2019   Procedure: THROMBECTOMY OF LEFT ARM ARTERIOVENTOUS (AV) FISTULA;  Surgeon: [name], MD;  Location: [facility];  Service: Vascular;  Laterality: Left;   TOE AMPUTATION       reports that he has never smoked. He has never used smokeless tobacco. He reports that he does not currently use alcohol. He reports that he does not use drugs.  Allergies  Allergen Reactions   Sulfa Antibiotics Nausea And Vomiting and Other (See Comments)    "Allergic," per [facility]    Family History  Problem Relation Age of Onset   Hypertension Mother     Prior to Admission medications   Medication Sig Start Date End Date Taking? Authorizing Provider  acetaminophen (TYLENOL) 325 MG tablet Take 1-2 tablets (325-650 mg total) by mouth every 6 (six) hours as needed for mild pain (pain score 1-3 or temp > 100.5). 07/08/22   Eugenie Filler, MD  Amino Acids-Protein Hydrolys (FEEDING SUPPLEMENT, PRO-STAT SUGAR FREE 64,) LIQD Take 30 mLs by mouth 2 (two) times daily.    [provider]  Amino Acids-Protein Hydrolys (PRO-STAT AWC) LIQD Take 30 mLs by mouth 2 (two) times daily.    [provider]  amLODipine (NORVASC) 5 MG tablet Take by mouth. 07/15/22   [provider]  amoxicillin-clavulanate (AUGMENTIN) 875-125 MG tablet Take 1 tablet by mouth daily. Patient not taking: Reported on 08/06/2022 07/09/22   [provider]  aspirin EC 81 MG EC tablet Take 1 tablet (81 mg total) by mouth daily. Swallow whole. 02/21/22   Raiford Noble Latif, DO  benzonatate (TESSALON) 200 MG capsule Take 200 mg by mouth See admin instructions. Take 200 mg by mouth at 8 AM, 2 PM, and 8 PM on Sun/Mon/Wed/Fri and 200 mg at 8 AM and 5 PM every Tues/Thurs/Sat    [provider]  ethyl chloride spray Apply 1 Application topically Every Tuesday,Thursday,and Saturday with dialysis.    [provider]  famotidine (PEPCID) 20 MG tablet Take 20 mg by mouth at bedtime.    [provider]  guaiFENesin (ROBITUSSIN) 100 MG/5ML liquid Take 10 mLs by mouth every 4 (four) hours as needed for cough.    [provider]  insulin glargine (LANTUS) 100 UNIT/ML injection Inject 0.32 mLs (32 Units total) into the skin at bedtime. 07/08/22   Irine Seal  V, MD  ipratropium-albuterol (DUONEB) 0.5-2.5 (3) MG/3ML SOLN Take 3 mLs by nebulization every 4 (four) hours as needed (for wheezing or shortness of breath).    [provider]  loperamide (IMODIUM) 2 MG capsule Take 1 capsule (2 mg total) by mouth as needed for diarrhea or loose stools. 07/08/22   Eugenie Filler, MD  multivitamin (RENA-VIT) TABS tablet Take 1 tablet by mouth at bedtime. 02/20/22   Raiford Noble Latif, DO  Nutritional Supplements (,FEEDING SUPPLEMENT, PROSOURCE PLUS) liquid Take 30 mLs by mouth 2 (two) times daily between meals. Patient not taking: Reported on 05/22/2022 02/21/22   Raiford Noble Latif, DO  Nutritional Supplements (FEEDING SUPPLEMENT, NEPRO CARB STEADY,) LIQD Take 237 mLs by mouth 2 (two) times daily between meals. 02/21/22   Raiford Noble Latif, DO  Nutritional Supplements (NEPRO)  LIQD Take by mouth 2 (two) times daily.    [provider]  ondansetron (ZOFRAN) 4 MG tablet Take 4 mg by mouth every 6 (six) hours as needed for nausea or vomiting. 06/30/22   [provider]  oxyCODONE-acetaminophen (PERCOCET) 5-325 MG tablet Take 1 tablet by mouth every 4 (four) hours as needed for severe pain. 07/08/22 07/08/23  Eugenie Filler, MD  pantoprazole (PROTONIX) 40 MG tablet Take 1 tablet (40 mg total) by mouth daily at 12 noon. Patient taking differently: Take 40 mg by mouth daily before breakfast. 02/21/22   Raiford Noble Latif, DO  senna-docusate (SENOKOT-S) 8.6-50 MG tablet Take 1 tablet by mouth at bedtime as needed for mild constipation. 07/08/22   Eugenie Filler, MD  silver sulfADIAZINE (SILVADENE) 1 % cream Apply 1 Application topically daily. 09/17/22   Suzan Slick, NP  sucroferric oxyhydroxide (VELPHORO) 500 MG chewable tablet Chew 1,000 mg by mouth See admin instructions. Chew 1,000 mg by mouth at 9 AM and 5 PM on daily and an additional 1,000 mg at noon only on Mon/Wed/Fri    [provider]  sucroferric oxyhydroxide (VELPHORO) 500 MG chewable tablet Chew 1,000 mg by mouth See admin instructions. Chew 1,000 mg by mouth one time a day every Mon, Wed, Fri, Sun with lunch    [provider]    Physical Exam: Vitals:   10/18/22 1154 10/18/22 1157 10/18/22 1454 10/18/22 1600  BP: (!) 147/76     Pulse: 95  94 93  Resp: 19  17 (!) 26  Temp: 99.7 F (37.6 C)     TempSrc:      SpO2: 92%  95% (!) 88%  Weight:  104.3 kg    Height:  6\' 1"  (1.854 m)      Constitutional: NAD, calm, comfortable Vitals:   10/18/22 1154 10/18/22 1157 10/18/22 1454 10/18/22 1600  BP: (!) 147/76     Pulse: 95  94 93  Resp: 19  17 (!) 26  Temp: 99.7 F (37.6 C)     TempSrc:      SpO2: 92%  95% (!) 88%  Weight:  104.3 kg    Height:  6\' 1"  (1.854 m)     Eyes: PERRL, lids and conjunctivae normal ENMT: Mucous membranes are moist. Posterior pharynx clear  of any exudate or lesions.Normal dentition.  Neck: normal, supple, no masses, no thyromegaly Respiratory: clear to auscultation bilaterally, no wheezing, fine crackles to bilateral mid field, increasing breathing effort. No accessory muscle use.  Cardiovascular: Regular rate and rhythm, no murmurs / rubs / gallops. 1+ extremity edema. 2+ pedal pulses. No carotid bruits.  Abdomen: no tenderness,  no masses palpated. No hepatosplenomegaly. Bowel sounds positive.  Musculoskeletal: no clubbing / cyanosis. No joint deformity upper and lower extremities. Good ROM, no contractures. Normal muscle tone.  Skin: no rashes, lesions, ulcers. No induration Neurologic: CN 2-12 grossly intact. Sensation intact, DTR normal. Strength 5/5 in all 4.  Psychiatric: Normal judgment and insight. Alert and oriented x 3. Normal mood.     Labs on Admission: I have personally reviewed following labs and imaging studies  CBC: Recent Labs  Lab 10/18/22 1155  WBC 11.3*  HGB 11.9*  HCT 37.0*  MCV 86.4  PLT 314   Basic Metabolic Panel: Recent Labs  Lab 10/18/22 1155  NA 137  K 4.8  CL 96*  CO2 27  GLUCOSE 155*  BUN 37*  CREATININE 9.47*  CALCIUM 9.9   GFR: Estimated Creatinine Clearance: 10.5 mL/min (A) (by C-G formula based on SCr of 9.47 mg/dL (H)). Liver Function Tests: No results for input(s): "AST", "ALT", "ALKPHOS", "BILITOT", "PROT", "ALBUMIN" in the last 168 hours. No results for input(s): "LIPASE", "AMYLASE" in the last 168 hours. No results for input(s): "AMMONIA" in the last 168 hours. Coagulation Profile: No results for input(s): "INR", "PROTIME" in the last 168 hours. Cardiac Enzymes: No results for input(s): "CKTOTAL", "CKMB", "CKMBINDEX", "TROPONINI" in the last 168 hours. BNP (last 3 results) No results for input(s): "PROBNP" in the last 8760 hours. HbA1C: No results for input(s): "HGBA1C" in the last 72 hours. CBG: No results for input(s): "GLUCAP" in the last 168 hours. Lipid  Profile: No results for input(s): "CHOL", "HDL", "LDLCALC", "TRIG", "CHOLHDL", "LDLDIRECT" in the last 72 hours. Thyroid Function Tests: No results for input(s): "TSH", "T4TOTAL", "FREET4", "T3FREE", "THYROIDAB" in the last 72 hours. Anemia Panel: No results for input(s): "VITAMINB12", "FOLATE", "FERRITIN", "TIBC", "IRON", "RETICCTPCT" in the last 72 hours. Urine analysis:    Component Value Date/Time   COLORURINE AMBER (A) 07/01/2022 1143   APPEARANCEUR CLEAR 07/01/2022 1143   LABSPEC 1.017 07/01/2022 1143   PHURINE 7.0 07/01/2022 1143   GLUCOSEU NEGATIVE 07/01/2022 1143   HGBUR NEGATIVE 07/01/2022 1143   BILIRUBINUR NEGATIVE 07/01/2022 1143   KETONESUR NEGATIVE 07/01/2022 1143   PROTEINUR >=300 (A) 07/01/2022 1143   NITRITE NEGATIVE 07/01/2022 1143   LEUKOCYTESUR NEGATIVE 07/01/2022 1143    Radiological Exams on Admission: DG Chest 2 View  Result Date: 10/18/2022 CLINICAL DATA:  Chest pain EXAM: CHEST - 2 VIEW COMPARISON:  07/01/2022 FINDINGS: Stable cardiomegaly. Pulmonary vascular congestion with diffuse interstitial prominence. Trace bilateral pleural effusions. Patchy bibasilar opacities. No pneumothorax. IMPRESSION: 1. Cardiomegaly with pulmonary vascular congestion and interstitial edema. 2. Trace bilateral pleural effusions. Bibasilar opacities may reflect atelectasis or pneumonia. Electronically Signed   By: Davina Poke D.O.   On: 10/18/2022 12:23    EKG: Independently reviewed.  Chronic LBBB  Assessment/Plan Principal Problem:   CHF (congestive heart failure) (HCC) Active Problems:   ESRD (end stage renal disease) (HCC)   Chronic systolic CHF (congestive heart failure) ([place])  (please populate well all problems here in Problem List. (For example, if patient is on BP meds at home and you resume or decide to hold them, it is a problem that needs to be her. Same for CAD, COPD, HLD and so on)  Acute on chronic HFrEF decompensation -Patient no longer makes urine, 1  dose of Lasix, emergent dialysis this afternoon as per nephrology. -Hold off amlodipine, change BP meds to Coreg. -Daily weight, I/O  Question of bacterial pneumonia -Most of his breathing symptoms can be  explained by fluid overload and CHF decompensation -However, low-grade fever is worrisome, will check CT chest and procalcitonin.  Hold off redosing antibiotics today.  Elevated troponins -No chest pains, EKG showed persistent LBBB -Suspect elevation troponin is demanding ischemia, trend troponin second set.  Hold off HF treatment.  ESRD on HD -Fluid overload, emergency HD this evening.  HTN -Change Amlodipine to Coreg  IDDM with insulin resistance -Continue Lantus, add sliding scale  Obesity -BMI=30, calorie control recommended.  DVT prophylaxis: Heparin subcu Code Status: Full code Family Communication: None at bedside Disposition Plan: Expect less than 2 midnight hospital stay Consults called: Nephrology Admission status: Tele observation   Lequita Halt MD [facility] Pager [phone]  10/18/2022, 4:04 PM

## 2022-10-18 NOTE — ED Notes (Signed)
To dialysis

## 2022-10-18 NOTE — Consult Note (Signed)
Renal Service Consult Note [facility]  OMARII SCALZO Sr. 10/18/2022 Sol Blazing, MD Requesting Physician: Dr. Roosevelt Locks  Reason for Consult: ESRD pt w/ cough, chest wall pain HPI: The patient is a 61 y.o. year-old w/ PMH as below who presented to ED today w/ cough and chest discomfort for a few days. States his roommate at [facility] has PNA. Hurts to take a deep breath. In ED BP 147/77, RR 17- 30, HR 90s, temp 99.7. 95% on RA. CXR shows CM, vasc congestion, IS edema mild and bibasilar opacities c/w atx or pna. Pt was given IV rocephin and zithromax IV. Concern also for CHF/ vol overload. We are asked to see for dialysis.   Pt seen in ED. Not in distress, on [place] O2. Has not missed any HD (except today). Good compliance w/ HD. Lives w/ his brother and takes the "Lucianne Lei" to HD sessions. Says he "sticks himself" at HD.    ROS - denies CP, no joint pain, no HA, no blurry vision, no rash, no diarrhea, no nausea/ vomiting, no dysuria, no difficulty voiding   Past Medical History  Past Medical History:  Diagnosis Date   CHF (congestive heart failure) ([place])    ECHO 02/05/22  EF 45-50%, DD indeterminent   Diabetes mellitus without complication ([place])    type 2   ESRD on hemodialysis ([place])    mon wed fri   Hypertension    Kidney failure    Myocardial infarction ([place])    Nonischemic cardiomyopathy [facility])    Past Surgical History  Past Surgical History:  Procedure Laterality Date   A/V FISTULAGRAM N/A 08/15/2019   Procedure: A/V FISTULAGRAM;  Surgeon: [name], MD;  Location: [place] CV LAB;  Service: Cardiovascular;  Laterality: N/A;   AMPUTATION Right 02/02/2022   Procedure: AMPUTATION 5th RAY;  Surgeon: [name], MD;  Location: [place];  Service: Podiatry;  Laterality: Right;   AMPUTATION Right 05/23/2022   Procedure: RIGHT BELOW KNEE AMPUTATION;  Surgeon: [name], MD;  Location: [place];  Service: Orthopedics;  Laterality: Right;   AMPUTATION  Right 07/04/2022   Procedure: REVISION AMPUTATION BELOW KNEE;  Surgeon: [name], MD;  Location: [place];  Service: Orthopedics;  Laterality: Right;   APPLICATION OF WOUND VAC  05/23/2022   Procedure: APPLICATION OF WOUND VAC;  Surgeon: [name], MD;  Location: [place];  Service: Orthopedics;;   AV FISTULA PLACEMENT     CARDIAC CATHETERIZATION  02/17/2018   LHC 02/17/18 ([name]): 30% pLAD, 30% dRCA, LVEDP 25-30, LVEF 25%.     CATARACT EXTRACTION W/PHACO Right 03/29/2020   Procedure: CATARACT EXTRACTION PHACO AND INTRAOCULAR LENS PLACEMENT ([place])  RIGHT DIABETIC;  Surgeon: [name], MD;  Location: [facility] ORS;  Service: Ophthalmology;  Laterality: Right;  Lot # [id number] H [place]: 02:30.9 AP% 9.5% CDE: 14.38   EYE SURGERY Right    cataract removed   I & D EXTREMITY Right 02/02/2022   Procedure: IRRIGATION AND DEBRIDEMENT RIGHT FOOT;  Surgeon: [name], MD;  Location: [place];  Service: Podiatry;  Laterality: Right;   INCISION AND DRAINAGE OF WOUND Right 02/08/2022   Procedure: IRRIGATION AND DEBRIDEMENT WOUND;  Surgeon: [name], DPM;  Location: [place];  Service: Podiatry;  Laterality: Right;  with removal of infected bone    INSERTION OF DIALYSIS CATHETER  08/13/2019   Procedure: Insertion Of Dialysis Catheter;  Surgeon: [name], MD;  Location: [facility] OR;  Service: Vascular;;   INSERTION  OF DIALYSIS CATHETER Left 07/19/2020   Procedure: INSERTION OF DIALYSIS CATHETER;  Surgeon: [name], MD;  Location: [place];  Service: Vascular;  Laterality: Left;   IRRIGATION AND DEBRIDEMENT ABSCESS     PERIPHERAL VASCULAR BALLOON ANGIOPLASTY  08/15/2019   Procedure: PERIPHERAL VASCULAR BALLOON ANGIOPLASTY;  Surgeon: [name], MD;  Location: [place] CV LAB;  Service: Cardiovascular;;   PERIPHERAL VASCULAR BALLOON ANGIOPLASTY Left 02/06/2022   Procedure: PERIPHERAL VASCULAR BALLOON ANGIOPLASTY;  Surgeon: [name], MD;  Location: [place] CV LAB;  Service: Cardiovascular;  Laterality: Left;   REVISON OF ARTERIOVENOUS FISTULA Left 07/19/2020   Procedure: REVISON/PLICATION OF ARTERIOVENOUS FISTULA LEFT;  Surgeon: [name], MD;  Location[facility];  Service: Vascular;  Laterality: Left;   THROMBECTOMY AND REVISION OF ARTERIOVENTOUS (AV) GORETEX  GRAFT Left 08/13/2019   Procedure: THROMBECTOMY OF LEFT ARM ARTERIOVENTOUS (AV) FISTULA;  Surgeon: [name], MD;  Location: [facility];  Service: Vascular;  Laterality: Left;   TOE AMPUTATION     Family History  Family History  Problem Relation Age of Onset   Hypertension Mother    Social History  reports that he has never smoked. He has never used smokeless tobacco. He reports that he does not currently use alcohol. He reports that he does not use drugs. Allergies  Allergies  Allergen Reactions   Sulfa Antibiotics Nausea And Vomiting and Other (See Comments)    "Allergic," per [facility]   Home medications Prior to Admission medications   Medication Sig Start Date End Date Taking? Authorizing Provider  acetaminophen (TYLENOL) 325 MG tablet Take 1-2 tablets (325-650 mg total) by mouth every 6 (six) hours as needed for mild pain (pain score 1-3 or temp > 100.5). 07/08/22   Eugenie Filler, MD  Amino Acids-Protein Hydrolys (FEEDING SUPPLEMENT, PRO-STAT SUGAR FREE 64,) LIQD Take 30 mLs by mouth 2 (two) times daily.    [provider]  Amino Acids-Protein Hydrolys (PRO-STAT AWC) LIQD Take 30 mLs by mouth 2 (two) times daily.    [provider]  amLODipine (NORVASC) 5 MG tablet Take by mouth. 07/15/22   [provider]  amoxicillin-clavulanate (AUGMENTIN) 875-125 MG tablet Take 1 tablet by mouth daily. Patient not taking: Reported on 08/06/2022 07/09/22   [provider]  aspirin EC 81 MG EC tablet Take 1 tablet (81 mg total) by mouth daily. Swallow whole. 02/21/22   Raiford Noble Latif, DO  benzonatate (TESSALON) 200 MG capsule Take 200 mg by  mouth See admin instructions. Take 200 mg by mouth at 8 AM, 2 PM, and 8 PM on Sun/Mon/Wed/Fri and 200 mg at 8 AM and 5 PM every Tues/Thurs/Sat    [provider]  ethyl chloride spray Apply 1 Application topically Every Tuesday,Thursday,and Saturday with dialysis.    [provider]  famotidine (PEPCID) 20 MG tablet Take 20 mg by mouth at bedtime.    [provider]  guaiFENesin (ROBITUSSIN) 100 MG/5ML liquid Take 10 mLs by mouth every 4 (four) hours as needed for cough.    [provider]  insulin glargine (LANTUS) 100 UNIT/ML injection Inject 0.32 mLs (32 Units total) into the skin at bedtime. 07/08/22   Eugenie Filler, MD  ipratropium-albuterol (DUONEB) 0.5-2.5 (3) MG/3ML SOLN Take 3 mLs by nebulization every 4 (four) hours as needed (for wheezing or shortness of breath).    [provider]  loperamide (IMODIUM) 2 MG capsule Take 1 capsule (2 mg total) by mouth as  needed for diarrhea or loose stools. 07/08/22   Eugenie Filler, MD  multivitamin (RENA-VIT) TABS tablet Take 1 tablet by mouth at bedtime. 02/20/22   Raiford Noble Latif, DO  Nutritional Supplements (,FEEDING SUPPLEMENT, PROSOURCE PLUS) liquid Take 30 mLs by mouth 2 (two) times daily between meals. Patient not taking: Reported on 05/22/2022 02/21/22   Raiford Noble Latif, DO  Nutritional Supplements (FEEDING SUPPLEMENT, NEPRO CARB STEADY,) LIQD Take 237 mLs by mouth 2 (two) times daily between meals. 02/21/22   Raiford Noble Latif, DO  Nutritional Supplements (NEPRO) LIQD Take by mouth 2 (two) times daily.    [provider]  ondansetron (ZOFRAN) 4 MG tablet Take 4 mg by mouth every 6 (six) hours as needed for nausea or vomiting. 06/30/22   [provider]  oxyCODONE-acetaminophen (PERCOCET) 5-325 MG tablet Take 1 tablet by mouth every 4 (four) hours as needed for severe pain. 07/08/22 07/08/23  Eugenie Filler, MD  pantoprazole (PROTONIX) 40 MG tablet Take 1 tablet (40 mg total)  by mouth daily at 12 noon. Patient taking differently: Take 40 mg by mouth daily before breakfast. 02/21/22   Raiford Noble Latif, DO  senna-docusate (SENOKOT-S) 8.6-50 MG tablet Take 1 tablet by mouth at bedtime as needed for mild constipation. 07/08/22   Eugenie Filler, MD  silver sulfADIAZINE (SILVADENE) 1 % cream Apply 1 Application topically daily. 09/17/22   Suzan Slick, NP  sucroferric oxyhydroxide (VELPHORO) 500 MG chewable tablet Chew 1,000 mg by mouth See admin instructions. Chew 1,000 mg by mouth at 9 AM and 5 PM on daily and an additional 1,000 mg at noon only on Mon/Wed/Fri    [provider]  sucroferric oxyhydroxide (VELPHORO) 500 MG chewable tablet Chew 1,000 mg by mouth See admin instructions. Chew 1,000 mg by mouth one time a day every Mon, Wed, Fri, Sun with lunch    [provider]     Vitals:   10/18/22 1700 10/18/22 1705 10/18/22 1730 10/18/22 1800  BP: (!) 141/74 (!) 140/74 (!) 145/81 (!) 147/77  Pulse: 94 94 95 95  Resp: (!) 30 (!) 24 (!) 22 (!) 26  Temp: 98.8 F (37.1 C)     TempSrc: Oral     SpO2: 99% 95%    Weight:      Height:       Exam Gen alert, no distress No rash, cyanosis or gangrene Sclera anicteric, throat clear  No jvd or bruits Chest bilat basilar rales, no wheezing, no ^wob RRR no MRG Abd soft ntnd no mass or ascites +bs GU normal male MS R BKA  Ext no LE or UE edema Neuro is alert, Ox 3 , nf    LUA AVF+bruit    Home meds include - norvasc 5, aspirin, pepcid, insulin glargine, duoneb, renavite, nepro/ prosource +, percocet, protonix, velphoro 1 gm ac tid, prns/ vits/ supps    OP HD: [facility] 4h 47min  450/500  105.5kg  2/2 bath  Heparin 6000  LUA AVF - last HD 11/30, post wt 105.1kg  - hectorol 4 ug IV tiw tts - no esa, last Hb 12.2   Assessment/ Plan: A/C systolic CHF exacerbation - otherwise known as vol overload in an esrd patient. Last EF 45-50% from march. +/- PNA, treating w/ IV abx. Will plan HD tonight  to get vol down.  PNA - question of pna, +LG fever, started on IV azithromax and rocephin.  ESRD - on HD TTS. Has not missed HD. HD  today/ tonight.  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 Cont velphoro as binder 1 gm ac tid.       [name] Splinter  MD 10/18/2022, 6:28 PM Recent Labs  Lab 10/18/22 1155  HGB 11.9*  CALCIUM 9.9  CREATININE 9.47*  K 4.8   Inpatient medications:  [START ON 10/19/2022] Chlorhexidine Gluconate Cloth  6 each Topical Q0600    azithromycin     cefTRIAXone (ROCEPHIN)  IV

## 2022-10-18 NOTE — ED Triage Notes (Signed)
Pt BIB [facility] from [facility] c/o chest wall pain that started this morning and a cough that has been ongoing for a couple days. Pt's roommate has pneumonia and staff states something is going around the building. Pt states it hurts to breathe.

## 2022-10-19 ENCOUNTER — Other Ambulatory Visit (HOSPITAL_COMMUNITY): Payer: Medicare Other

## 2022-10-19 ENCOUNTER — Observation Stay (HOSPITAL_COMMUNITY): Payer: Medicare Other

## 2022-10-19 DIAGNOSIS — Z1152 Encounter for screening for COVID-19: Secondary | ICD-10-CM | POA: Diagnosis not present

## 2022-10-19 DIAGNOSIS — Z993 Dependence on wheelchair: Secondary | ICD-10-CM | POA: Diagnosis not present

## 2022-10-19 DIAGNOSIS — I509 Heart failure, unspecified: Secondary | ICD-10-CM | POA: Diagnosis present

## 2022-10-19 DIAGNOSIS — E785 Hyperlipidemia, unspecified: Secondary | ICD-10-CM | POA: Diagnosis present

## 2022-10-19 DIAGNOSIS — N186 End stage renal disease: Secondary | ICD-10-CM

## 2022-10-19 DIAGNOSIS — Z6830 Body mass index (BMI) 30.0-30.9, adult: Secondary | ICD-10-CM | POA: Diagnosis not present

## 2022-10-19 DIAGNOSIS — E669 Obesity, unspecified: Secondary | ICD-10-CM

## 2022-10-19 DIAGNOSIS — Z7982 Long term (current) use of aspirin: Secondary | ICD-10-CM | POA: Diagnosis not present

## 2022-10-19 DIAGNOSIS — M898X9 Other specified disorders of bone, unspecified site: Secondary | ICD-10-CM | POA: Diagnosis present

## 2022-10-19 DIAGNOSIS — Z89511 Acquired absence of right leg below knee: Secondary | ICD-10-CM | POA: Diagnosis not present

## 2022-10-19 DIAGNOSIS — I2510 Atherosclerotic heart disease of native coronary artery without angina pectoris: Secondary | ICD-10-CM | POA: Diagnosis present

## 2022-10-19 DIAGNOSIS — S88111A Complete traumatic amputation at level between knee and ankle, right lower leg, initial encounter: Secondary | ICD-10-CM

## 2022-10-19 DIAGNOSIS — E8770 Fluid overload, unspecified: Secondary | ICD-10-CM | POA: Insufficient documentation

## 2022-10-19 DIAGNOSIS — J449 Chronic obstructive pulmonary disease, unspecified: Secondary | ICD-10-CM | POA: Diagnosis present

## 2022-10-19 DIAGNOSIS — I5023 Acute on chronic systolic (congestive) heart failure: Secondary | ICD-10-CM | POA: Diagnosis present

## 2022-10-19 DIAGNOSIS — Z8673 Personal history of transient ischemic attack (TIA), and cerebral infarction without residual deficits: Secondary | ICD-10-CM

## 2022-10-19 DIAGNOSIS — Z992 Dependence on renal dialysis: Secondary | ICD-10-CM | POA: Diagnosis not present

## 2022-10-19 DIAGNOSIS — E1122 Type 2 diabetes mellitus with diabetic chronic kidney disease: Secondary | ICD-10-CM | POA: Diagnosis present

## 2022-10-19 DIAGNOSIS — E1151 Type 2 diabetes mellitus with diabetic peripheral angiopathy without gangrene: Secondary | ICD-10-CM | POA: Diagnosis present

## 2022-10-19 DIAGNOSIS — Z794 Long term (current) use of insulin: Secondary | ICD-10-CM | POA: Diagnosis not present

## 2022-10-19 DIAGNOSIS — I447 Left bundle-branch block, unspecified: Secondary | ICD-10-CM | POA: Diagnosis present

## 2022-10-19 DIAGNOSIS — I252 Old myocardial infarction: Secondary | ICD-10-CM | POA: Diagnosis not present

## 2022-10-19 DIAGNOSIS — I10 Essential (primary) hypertension: Secondary | ICD-10-CM

## 2022-10-19 DIAGNOSIS — I428 Other cardiomyopathies: Secondary | ICD-10-CM | POA: Diagnosis present

## 2022-10-19 DIAGNOSIS — D631 Anemia in chronic kidney disease: Secondary | ICD-10-CM | POA: Diagnosis present

## 2022-10-19 DIAGNOSIS — I132 Hypertensive heart and chronic kidney disease with heart failure and with stage 5 chronic kidney disease, or end stage renal disease: Secondary | ICD-10-CM | POA: Diagnosis present

## 2022-10-19 DIAGNOSIS — E88819 Insulin resistance, unspecified: Secondary | ICD-10-CM | POA: Diagnosis present

## 2022-10-19 DIAGNOSIS — J9601 Acute respiratory failure with hypoxia: Secondary | ICD-10-CM | POA: Diagnosis present

## 2022-10-19 LAB — BASIC METABOLIC PANEL
Anion gap: 13 (ref 5–15)
BUN: 23 mg/dL — ABNORMAL HIGH (ref 6–20)
CO2: 28 mmol/L (ref 22–32)
Calcium: 9.4 mg/dL (ref 8.9–10.3)
Chloride: 94 mmol/L — ABNORMAL LOW (ref 98–111)
Creatinine, Ser: 6.69 mg/dL — ABNORMAL HIGH (ref 0.61–1.24)
GFR, Estimated: 9 mL/min — ABNORMAL LOW (ref >60)
Glucose, Bld: 125 mg/dL — ABNORMAL HIGH (ref 70–99)
Potassium: 4.2 mmol/L (ref 3.5–5.1)
Sodium: 135 mmol/L (ref 135–145)

## 2022-10-19 LAB — MRSA NEXT GEN BY PCR, NASAL: MRSA by PCR Next Gen: NOT DETECTED

## 2022-10-19 LAB — GLUCOSE, CAPILLARY
Glucose-Capillary: 114 mg/dL — ABNORMAL HIGH (ref 70–99)
Glucose-Capillary: 91 mg/dL (ref 70–99)
Glucose-Capillary: 142 mg/dL — ABNORMAL HIGH (ref 70–99)
Glucose-Capillary: 210 mg/dL — ABNORMAL HIGH (ref 70–99)

## 2022-10-19 LAB — HEPATITIS B SURFACE ANTIGEN: Hepatitis B Surface Ag: NONREACTIVE

## 2022-10-19 LAB — PROCALCITONIN: Procalcitonin: 3.12 ng/mL

## 2022-10-19 NOTE — Progress Notes (Addendum)
Progress Note   Patient: [name] COOPMAN Sr. WUJ:[id number] DOB: 1961-04-17 DOA: 10/18/2022     0 DOS: the patient was seen and examined on 10/19/2022   Brief hospital course: Mr. Jaquith was admitted to the hospital with the working diagnosis of volume overload in the setting of ESRD.   61 yo male with the past medical history of ESRD on HD, heart failure, mild aortic stenosis, T2DM, peripheral vascular disease, sp right BKA who presented with dyspnea. Last HD 2 days prior to admission. At home he had worsening dyspnea and cough. On the day of admission his dyspnea was triggered by minimal efforts, he came to ED and not to the HD outpatient unit. On his initial physical examination his blood pressure was 147/76, HR 95, RR 19 and 02 saturation 92%, lungs with rales bilaterally, with increased work of breathing, heart with S1 and S2 present and rhythmic, abdomen with no distention and positive lower extremity edema.   Na 137,K 4,8 Cl 96 bicarbonate 27, glucose 155 bun 37 cr 9,47  Wbc 11.3 hgb 11.9 plt 212  Sars covid 19 negative   Chest radiograph with cardiomegaly, with bilateral hilar vascular congestion and small bilateral pleural effusions.  CT chest with faint ground glass opacities bilaterally with small bilateral pleural effusions. Small pericardial effusion.   EKG 95 bpm, left axis deviation, incomplete left bundle branch block, sinus rhythm with q waves V1 to V3, poor R R wave progression, with no significant ST segment or T wave changes.   Patient underwent urgent hemodialysis with improvement in his symptoms.    Assessment and Plan: * ESRD (end stage renal disease) ([place]) Volume overload with acute hypoxemic respiratory failure due to acute pulmonary edema.  Follow up chest CT with no mnemonic infiltrates, ruled out pneumonia.   Patient had emergent HD with improvement in volume status, but not back to baseline, continue to have dyspnea.  Plan to continue supplemental 02 per  [place] Discontinue antibiotic therapy. Follow up with nephrology for inpatient renal replacement therapy with ultrafiltration.   Acute on chronic systolic CHF (congestive heart failure) ([place]) 03/23 Echocardiogram with preserved LV systolic function with EF 45 to 50%, global hypokinesis, moderate LVH, RV systolic function is preserved, no pericardial effusion, with no significant valvular disease. Ct chest with mild pericardial effusion.   Volume status has improved with ultrafiltration but not back to baseline, continue renal replacement therapy per nephrology recommendations.  Continue with carvedilol.   HTN (hypertension) Continue blood pressure control with carvedilol. Systolic blood pressure has been 124 to 107 mmHg.  Continue blood pressure monitoring.   DM2 (diabetes mellitus, type 2) (HCC) Fasting glucose this am 125 mg/dl Continue glucose cover and monitoring with insulin sliding scale and basal insulin 20 units.  Patient is tolerating po well.   CAD (coronary artery disease) No chest pain, continue aspirin.  Elevated troponin due to volume overload, no clinical signs of acute coronary syndrome.   History of CVA  Continue aspirin and blood pressure control.  Out of bed to chair tid with meals.   Below-knee amputation of right lower extremity ([place]) Pt and Ot during his hospitalization Patient uses a wheelchair for mobility as outpatient.   Obesity (BMI 30-39.9) Calculated BMI is 30,0 obesity class 1         Subjective: Patient with improvement in dyspnea but not back to baseline, continue to have intermittent cough   Physical Exam: Vitals:   10/19/22 0121 10/19/22 0546 10/19/22 0715 10/19/22 1219  BP:  124/65 124/74 107/67  Pulse:  90 90 91  Resp: (!) 22 (!) 22 17 16   Temp:  98.6 F (37 C) 98.1 F (36.7 C) 98.9 F (37.2 C)  TempSrc:  Oral Oral Oral  SpO2:  100% 100% 95%  Weight:  103.3 kg    Height:       Neurology awake and alert ENT with mild  pallor Cardiovascular with S1 and S2 present and rhythmic with no gallops, rubs or murmurs Respiratory with rales at bases and scattered rhonchi Abdomen with no distention Right BKA  Data Reviewed:    Family Communication: no family at the bedside   Disposition: Status is: Inpatient Remains inpatient appropriate because: inpatient HD   Planned Discharge Destination: SNF     Author: Tawni Millers, MD 10/19/2022 1:33 PM  For on call review [email or web address].

## 2022-10-19 NOTE — Assessment & Plan Note (Signed)
03/23 Echocardiogram with preserved LV systolic function with EF 45 to 50%, global hypokinesis, moderate LVH, RV systolic function is preserved, no pericardial effusion, with no significant valvular disease. Ct chest with mild pericardial effusion.   Volume status has improved with ultrafiltration but not back to baseline, continue renal replacement therapy per nephrology recommendations.  Continue with carvedilol.

## 2022-10-19 NOTE — Assessment & Plan Note (Signed)
Continue aspirin and blood pressure control.  Out of bed to chair tid with meals.

## 2022-10-19 NOTE — Progress Notes (Addendum)
[facility] Progress Note   Subjective:    Seen and examined patient at bedside. Tolerated yesterday's HD with net UF 3.5L. He reports his breathing has improved. Remains on RA with O2 saturations stable. Will resume TTS schedule. Next HD 12/5.  Objective Vitals:   10/19/22 0121 10/19/22 0546 10/19/22 0715 10/19/22 1219  BP:  124/65 124/74 107/67  Pulse:  90 90 91  Resp: (!) 22 (!) 22 17 16   Temp:  98.6 F (37 C) 98.1 F (36.7 C) 98.9 F (37.2 C)  TempSrc:  Oral Oral Oral  SpO2:  100% 100% 95%  Weight:  103.3 kg    Height:       Physical Exam General: Awake, alert, NAD, on RA Heart: S1 and S2; No MRGs Lungs: Diminished in bases, clear in uppers Abdomen: Soft and non-tender Extremities: No LE edema Dialysis Access: L AVF (+) B/T   Filed Weights   10/18/22 2100 10/18/22 2211 10/19/22 0546  Weight: 99.5 kg 103 kg 103.3 kg    Intake/Output Summary (Last 24 hours) at 10/19/2022 1346 Last data filed at 10/19/2022 1222 Gross per 24 hour  Intake 463 ml  Output 7000 ml  Net -6537 ml    Additional Objective Labs: Basic Metabolic Panel: Recent Labs  Lab 10/18/22 1155 10/19/22 0038  NA 137 135  K 4.8 4.2  CL 96* 94*  CO2 27 28  GLUCOSE 155* 125*  BUN 37* 23*  CREATININE 9.47* 6.69*  CALCIUM 9.9 9.4   Liver Function Tests: No results for input(s): "AST", "ALT", "ALKPHOS", "BILITOT", "PROT", "ALBUMIN" in the last 168 hours. No results for input(s): "LIPASE", "AMYLASE" in the last 168 hours. CBC: Recent Labs  Lab 10/18/22 1155  WBC 11.3*  HGB 11.9*  HCT 37.0*  MCV 86.4  PLT 212   Blood Culture    Component Value Date/Time   SDES  07/02/2022 0100    BLOOD RIGHT FOREARM Performed at [facility], [place] [address] [place], [place] [address]    SPECREQUEST  07/02/2022 0100    BOTTLES DRAWN AEROBIC ONLY Blood Culture adequate volume Performed at [facility] [address], [place], [place] [address]     CULT  07/02/2022 0100    NO GROWTH 5 DAYS Performed at [facility] [address], [place], [place] [address]    REPTSTATUS 07/07/2022 FINAL 07/02/2022 0100    Cardiac Enzymes: No results for input(s): "CKTOTAL", "CKMB", "CKMBINDEX", "TROPONINI" in the last 168 hours. CBG: Recent Labs  Lab 10/19/22 0810 10/19/22 1146  GLUCAP 114* 91   Iron Studies: No results for input(s): "IRON", "TIBC", "TRANSFERRIN", "FERRITIN" in the last 72 hours. Lab Results  Component Value Date   INR 1.4 (H) 07/01/2022   INR 1.3 (H) 02/03/2022   INR 1.3 (H) 02/02/2022   Studies/Results: CT CHEST WO CONTRAST  Result Date: 10/19/2022 CLINICAL DATA:  Possible complicated pneumonia. EXAM: CT CHEST WITHOUT CONTRAST TECHNIQUE: Multidetector CT imaging of the chest was performed following the standard protocol without IV contrast. RADIATION DOSE REDUCTION: This exam was performed according to the departmental dose-optimization program which includes automated exposure control, adjustment of the mA and/or kV according to patient size and/or use of iterative reconstruction technique. COMPARISON:  Abdominal CT 08/05/2019 and chest x-ray 10/19/2022 FINDINGS: Cardiovascular: Mild cardiomegaly. Small pericardial effusion is present. Calcified plaque over the left main and 3 vessel coronary arteries. Mild calcification the region the mitral valve annulus and aortic valve. Thoracic aorta is normal in  caliber, although there is minimal ill definition of the ascending thoracic aortic wall which may be due to the adjacent tracking pericardial fluid. There is calcified plaque over the thoracic aorta. Calcified plaque over the descending thoracic aorta. Remaining mediastinal structures are unremarkable. Mediastinum/Nodes: No mediastinal or hilar adenopathy. Lungs/Pleura: Small left pleural effusion with associated basilar atelectasis. Mild compressive atelectasis over the right base with possible tiny amount of associated  pleural fluid. Remainder of the lungs are clear. Airways are unremarkable. Upper Abdomen: Calcified plaque over the abdominal aorta. Minimal fluid over the left upper quadrant adjacent the spleen. 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 IMPRESSION: 1. Mild cardiomegaly with small pericardial effusion which is new compared to the prior exam. Slight ill definition of the ascending thoracic aorta which is otherwise normal in caliber. If clinical concern for aortic root integrity or aortic dissection, consider contrast-enhanced CT for further evaluation. 2. Small left pleural effusion with associated basilar atelectasis. Mild compressive atelectasis over the right base with possible tiny amount of associated pleural fluid. 3. Minimal free fluid over the left upper quadrant adjacent the spleen. 4. Aortic atherosclerosis. Atherosclerotic coronary artery disease. 5. 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 Aortic Atherosclerosis (ICD10-I70.0). These results will be called to the ordering clinician or representative by the Radiologist Assistant, and communication documented in the PACS or [employer]. Electronically Signed   By: Marin Olp M.D.   On: 10/19/2022 11:35   DG Chest 1 View  Result Date: 10/19/2022 CLINICAL DATA:  CHF EXAM: CHEST  1 VIEW COMPARISON:  Yesterday FINDINGS: Cardiopericardial enlargement with congested central vessels. Possible small left pleural effusion. No consolidation or air leak. Artifact from EKG leads. IMPRESSION: Mildly improved aeration at the right base. Similar degree of vascular congestion. Electronically Signed   By: Jorje Guild M.D.   On: 10/19/2022 07:42   DG Chest 2 View  Result Date: 10/18/2022 CLINICAL DATA:  Chest pain EXAM: CHEST - 2 VIEW COMPARISON:  07/01/2022 FINDINGS: Stable cardiomegaly. Pulmonary vascular congestion with diffuse interstitial prominence. Trace bilateral pleural effusions. Patchy bibasilar opacities. No  pneumothorax. IMPRESSION: 1. Cardiomegaly with pulmonary vascular congestion and interstitial edema. 2. Trace bilateral pleural effusions. Bibasilar opacities may reflect atelectasis or pneumonia. Electronically Signed   By: Davina Poke D.O.   On: 10/18/2022 12:23    Medications:  sodium chloride      (feeding supplement) PROSource Plus  30 mL Oral BID   aspirin EC  81 mg Oral Daily   carvedilol  3.125 mg Oral BID WC   Chlorhexidine Gluconate Cloth  6 each Topical Q0600   famotidine  10 mg Oral QHS   feeding supplement (NEPRO CARB STEADY)  237 mL Oral BID BM   heparin  5,000 Units Subcutaneous Q12H   heparin  6,000 Units Dialysis Once in dialysis   insulin aspart  0-9 Units Subcutaneous TID WC   insulin glargine-yfgn  20 Units Subcutaneous Daily   multivitamin  1 tablet Oral QHS   pantoprazole  40 mg Oral QAC breakfast   sodium chloride flush  3 mL Intravenous Q12H   sucroferric oxyhydroxide  1,000 mg Oral TID with meals    Dialysis Orders: [facility] 4h 7min  450/500  105.5kg  2/2 bath  Heparin 6000  LUA AVF - last HD 11/30, post wt 105.1kg  - hectorol 4 ug IV tiw tts - no esa, last Hb 12.2  Assessment/Plan: A/C systolic CHF exacerbation - otherwise known as vol overload in an esrd patient. Last EF 45-50%  from march. Received HD yesterday-he's feeling better. PNA - started on ABX at admit for questionable PNA. Recent chest CT showed no infiltrates so PNA now r/o. Appears ABX were completed. ESRD - on HD TTS. S/p HD 12/2. He is feeling better and f/u CXR improved. May need to lower EDW at dc. Next HD 12/5. 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 Cont velphoro as binder 1 gm ac tid.   Tobie Poet, NP [facility] 10/19/2022,1:46 PM  LOS: 0 days

## 2022-10-19 NOTE — Assessment & Plan Note (Signed)
Volume overload with acute hypoxemic respiratory failure due to acute pulmonary edema.  Follow up chest CT with no mnemonic infiltrates, ruled out pneumonia.   Patient had emergent HD with improvement in volume status, but not back to baseline, continue to have dyspnea.  Plan to continue supplemental 02 per [place] Discontinue antibiotic therapy. Follow up with nephrology for inpatient renal replacement therapy with ultrafiltration.

## 2022-10-19 NOTE — Assessment & Plan Note (Signed)
Pt and Ot during his hospitalization Patient uses a wheelchair for mobility as outpatient.

## 2022-10-19 NOTE — Assessment & Plan Note (Addendum)
No chest pain, continue aspirin.  Elevated troponin due to volume overload, no clinical signs of acute coronary syndrome.

## 2022-10-19 NOTE — Hospital Course (Addendum)
Mr. Haueter was admitted to the hospital with the working diagnosis of volume overload in the setting of ESRD.   61 yo male with the past medical history of ESRD on HD, heart failure, mild aortic stenosis, T2DM, peripheral vascular disease, sp right BKA who presented with dyspnea. Last HD 2 days prior to admission. At home he had worsening dyspnea and cough. On the day of admission his dyspnea was triggered by minimal efforts, he came to ED instead of  the HD outpatient unit. On his initial physical examination his blood pressure was 147/76, HR 95, RR 19 and 02 saturation 92%, lungs with rales bilaterally, with increased work of breathing, heart with S1 and S2 present and rhythmic, abdomen with no distention and positive lower extremity edema.   Na 137,K 4,8 Cl 96 bicarbonate 27, glucose 155 bun 37 cr 9,47  Wbc 11.3 hgb 11.9 plt 212  Sars covid 19 negative  High sensitive troponin 75 and 82   Chest radiograph with cardiomegaly, with bilateral hilar vascular congestion and small bilateral pleural effusions.  CT chest with faint ground glass opacities bilaterally with small bilateral pleural effusions. Small pericardial effusion.   EKG 95 bpm, left axis deviation, incomplete left bundle branch block, sinus rhythm with q waves V1 to V3, poor R R wave progression, with no significant ST segment or T wave changes.   Patient underwent urgent hemodialysis with improvement in his symptoms.   12/04 patient will continue renal replacement therapy as outpatient, his dry weight will be lowered for better ultrafiltration.

## 2022-10-19 NOTE — Assessment & Plan Note (Signed)
Continue blood pressure control with carvedilol. Systolic blood pressure has been 124 to 107 mmHg.  Continue blood pressure monitoring.

## 2022-10-19 NOTE — Progress Notes (Signed)
   10/19/22 1210  Provider Notification  Provider Name/Title Dr. Arrien/ Attending  Date Provider Notified 10/19/22  Time Provider Notified 1210  Method of Notification Page  Notification Reason Critical Result (CT chest with new pericardial effusion)  Test performed and critical result CT chest  Date Critical Result Received 10/19/22  Time Critical Result Received 1200  Provider response Evaluate remotely  Date of Provider Response 10/19/22  Time of Provider Response 1218

## 2022-10-19 NOTE — Assessment & Plan Note (Signed)
His glucose remained stable during his hospitalization, patient will continue with his insulin regimen.  Hi fasting glucose at the time of his discharge is 193 mg/dl

## 2022-10-19 NOTE — Assessment & Plan Note (Signed)
Calculated BMI is 30,0 obesity class 1

## 2022-10-20 DIAGNOSIS — I5023 Acute on chronic systolic (congestive) heart failure: Secondary | ICD-10-CM | POA: Diagnosis not present

## 2022-10-20 DIAGNOSIS — I10 Essential (primary) hypertension: Secondary | ICD-10-CM | POA: Diagnosis not present

## 2022-10-20 DIAGNOSIS — S88111A Complete traumatic amputation at level between knee and ankle, right lower leg, initial encounter: Secondary | ICD-10-CM | POA: Diagnosis not present

## 2022-10-20 DIAGNOSIS — N186 End stage renal disease: Secondary | ICD-10-CM | POA: Diagnosis not present

## 2022-10-20 LAB — CBC WITH DIFFERENTIAL/PLATELET
Abs Immature Granulocytes: 0.03 10*3/uL (ref 0.00–0.07)
Basophils Absolute: 0.1 10*3/uL (ref 0.0–0.1)
Basophils Relative: 1 %
Eosinophils Absolute: 0.4 10*3/uL (ref 0.0–0.5)
Eosinophils Relative: 4 %
HCT: 32.9 % — ABNORMAL LOW (ref 39.0–52.0)
Hemoglobin: 11 g/dL — ABNORMAL LOW (ref 13.0–17.0)
Immature Granulocytes: 0 %
Lymphocytes Relative: 13 %
Lymphs Abs: 1.2 10*3/uL (ref 0.7–4.0)
MCH: 28 pg (ref 26.0–34.0)
MCHC: 33.4 g/dL (ref 30.0–36.0)
MCV: 83.7 fL (ref 80.0–100.0)
Monocytes Absolute: 2.1 10*3/uL — ABNORMAL HIGH (ref 0.1–1.0)
Monocytes Relative: 25 %
Neutro Abs: 5 10*3/uL (ref 1.7–7.7)
Neutrophils Relative %: 57 %
Platelets: 169 10*3/uL (ref 150–400)
RBC: 3.93 MIL/uL — ABNORMAL LOW (ref 4.22–5.81)
RDW: 22.5 % — ABNORMAL HIGH (ref 11.5–15.5)
WBC: 8.7 10*3/uL (ref 4.0–10.5)
nRBC: 0 % (ref 0.0–0.2)

## 2022-10-20 LAB — PHOSPHORUS: Phosphorus: 5.1 mg/dL — ABNORMAL HIGH (ref 2.5–4.6)

## 2022-10-20 LAB — GLUCOSE, CAPILLARY
Glucose-Capillary: 153 mg/dL — ABNORMAL HIGH (ref 70–99)
Glucose-Capillary: 160 mg/dL — ABNORMAL HIGH (ref 70–99)
Glucose-Capillary: 146 mg/dL — ABNORMAL HIGH (ref 70–99)

## 2022-10-20 LAB — BASIC METABOLIC PANEL
Anion gap: 14 (ref 5–15)
BUN: 43 mg/dL — ABNORMAL HIGH (ref 6–20)
CO2: 25 mmol/L (ref 22–32)
Calcium: 9 mg/dL (ref 8.9–10.3)
Chloride: 92 mmol/L — ABNORMAL LOW (ref 98–111)
Creatinine, Ser: 8.56 mg/dL — ABNORMAL HIGH (ref 0.61–1.24)
GFR, Estimated: 7 mL/min — ABNORMAL LOW (ref >60)
Glucose, Bld: 193 mg/dL — ABNORMAL HIGH (ref 70–99)
Potassium: 4.2 mmol/L (ref 3.5–5.1)
Sodium: 131 mmol/L — ABNORMAL LOW (ref 135–145)

## 2022-10-20 LAB — HEPATITIS B SURFACE ANTIBODY, QUANTITATIVE: Hep B S AB Quant (Post): 379.6 m[IU]/mL (ref >9.9)

## 2022-10-20 MED ORDER — NEPRO/CARBSTEADY PO LIQD: 237.0000 mL | Freq: Two times a day (BID) | ORAL | 0 refills | Status: DC

## 2022-10-20 MED ORDER — OXYCODONE-ACETAMINOPHEN 5-325 MG PO TABS: 1.0000 | ORAL_TABLET | Freq: Four times a day (QID) | ORAL | 0 refills | Status: DC | PRN

## 2022-10-20 NOTE — Progress Notes (Signed)
EMS arrived to transport patient back to his facility.  Patient belongings bagged and given to EMS including 1 pair of pants, 1 shirt, 1 sock, and 1 shoe.  Pt transported on stretcher off of the unit in care of EMS.

## 2022-10-20 NOTE — Progress Notes (Signed)
[facility] Progress Note   Subjective:    Seen and examined patient at bedside.  Feeling nearly back to baseline except lingering cough.    Objective Vitals:   10/19/22 1609 10/19/22 1930 10/20/22 0420 10/20/22 0625  BP: 125/68 131/65 (!) 145/89   Pulse: 86 86 82   Resp: 19 (!) 21 19   Temp: 98.1 F (36.7 C) 99.5 F (37.5 C) 98.2 F (36.8 C)   TempSrc: Oral Oral Oral   SpO2: 93% 92% 92% 97%  Weight:   104.1 kg   Height:       Physical Exam General: Awake, alert, NAD, on RA Heart: S1 and S2; No MRGs Lungs: coughing but clear Abdomen: Soft and non-tender Extremities: No LE edema Dialysis Access: L AVF (+) B/T   Filed Weights   10/18/22 2211 10/19/22 0546 10/20/22 0420  Weight: 103 kg 103.3 kg 104.1 kg    Intake/Output Summary (Last 24 hours) at 10/20/2022 0947 Last data filed at 10/19/2022 1400 Gross per 24 hour  Intake 243 ml  Output --  Net 243 ml     Additional Objective Labs: Basic Metabolic Panel: Recent Labs  Lab 10/18/22 1155 10/19/22 0038 10/20/22 0106  NA 137 135 131*  K 4.8 4.2 4.2  CL 96* 94* 92*  CO2 27 28 25   GLUCOSE 155* 125* 193*  BUN 37* 23* 43*  CREATININE 9.47* 6.69* 8.56*  CALCIUM 9.9 9.4 9.0  PHOS  --   --  5.1*    Liver Function Tests: No results for input(s): "AST", "ALT", "ALKPHOS", "BILITOT", "PROT", "ALBUMIN" in the last 168 hours. No results for input(s): "LIPASE", "AMYLASE" in the last 168 hours. CBC: Recent Labs  Lab 10/18/22 1155 10/20/22 0106  WBC 11.3* 8.7  NEUTROABS  --  5.0  HGB 11.9* 11.0*  HCT 37.0* 32.9*  MCV 86.4 83.7  PLT 212 169    Blood Culture    Component Value Date/Time   SDES  07/02/2022 0100    BLOOD RIGHT FOREARM Performed at [facility] [address] [place], [place] [address]    SPECREQUEST  07/02/2022 0100    BOTTLES DRAWN AEROBIC ONLY Blood Culture adequate volume Performed at [facility] [address] [place], [place]  [address]    CULT  07/02/2022 0100    NO GROWTH 5 DAYS Performed at [facility] [address] [place], [place] [address]    REPTSTATUS 07/07/2022 FINAL 07/02/2022 0100    Cardiac Enzymes: No results for input(s): "CKTOTAL", "CKMB", "CKMBINDEX", "TROPONINI" in the last 168 hours. CBG: Recent Labs  Lab 10/19/22 0810 10/19/22 1146 10/19/22 1608 10/19/22 2127 10/20/22 0553  GLUCAP 114* 91 142* 210* 153*    Iron Studies: No results for input(s): "IRON", "TIBC", "TRANSFERRIN", "FERRITIN" in the last 72 hours. Lab Results  Component Value Date   INR 1.4 (H) 07/01/2022   INR 1.3 (H) 02/03/2022   INR 1.3 (H) 02/02/2022   Studies/Results: CT CHEST WO CONTRAST  Result Date: 10/19/2022 CLINICAL DATA:  Possible complicated pneumonia. EXAM: CT CHEST WITHOUT CONTRAST TECHNIQUE: Multidetector CT imaging of the chest was performed following the standard protocol without IV contrast. RADIATION DOSE REDUCTION: This exam was performed according to the departmental dose-optimization program which includes automated exposure control, adjustment of the mA and/or kV according to patient size and/or use of iterative reconstruction technique. COMPARISON:  Abdominal CT 08/05/2019 and chest x-ray 10/19/2022 FINDINGS: Cardiovascular: Mild cardiomegaly. Small pericardial effusion is present. Calcified plaque  over the left main and 3 vessel coronary arteries. Mild calcification the region the mitral valve annulus and aortic valve. Thoracic aorta is normal in caliber, although there is minimal ill definition of the ascending thoracic aortic wall which may be due to the adjacent tracking pericardial fluid. There is calcified plaque over the thoracic aorta. Calcified plaque over the descending thoracic aorta. Remaining mediastinal structures are unremarkable. Mediastinum/Nodes: No mediastinal or hilar adenopathy. Lungs/Pleura: Small left pleural effusion with associated basilar atelectasis. Mild compressive  atelectasis over the right base with possible tiny amount of associated pleural fluid. Remainder of the lungs are clear. Airways are unremarkable. Upper Abdomen: Calcified plaque over the abdominal aorta. Minimal fluid over the left upper quadrant adjacent the spleen. 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 IMPRESSION: 1. Mild cardiomegaly with small pericardial effusion which is new compared to the prior exam. Slight ill definition of the ascending thoracic aorta which is otherwise normal in caliber. If clinical concern for aortic root integrity or aortic dissection, consider contrast-enhanced CT for further evaluation. 2. Small left pleural effusion with associated basilar atelectasis. Mild compressive atelectasis over the right base with possible tiny amount of associated pleural fluid. 3. Minimal free fluid over the left upper quadrant adjacent the spleen. 4. Aortic atherosclerosis. Atherosclerotic coronary artery disease. 5. 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 Aortic Atherosclerosis (ICD10-I70.0). These results will be called to the ordering clinician or representative by the Radiologist Assistant, and communication documented in the PACS or [employer]. Electronically Signed   By: Marin Olp M.D.   On: 10/19/2022 11:35   DG Chest 1 View  Result Date: 10/19/2022 CLINICAL DATA:  CHF EXAM: CHEST  1 VIEW COMPARISON:  Yesterday FINDINGS: Cardiopericardial enlargement with congested central vessels. Possible small left pleural effusion. No consolidation or air leak. Artifact from EKG leads. IMPRESSION: Mildly improved aeration at the right base. Similar degree of vascular congestion. Electronically Signed   By: Jorje Guild M.D.   On: 10/19/2022 07:42   DG Chest 2 View  Result Date: 10/18/2022 CLINICAL DATA:  Chest pain EXAM: CHEST - 2 VIEW COMPARISON:  07/01/2022 FINDINGS: Stable cardiomegaly. Pulmonary vascular congestion with diffuse interstitial prominence. Trace  bilateral pleural effusions. Patchy bibasilar opacities. No pneumothorax. IMPRESSION: 1. Cardiomegaly with pulmonary vascular congestion and interstitial edema. 2. Trace bilateral pleural effusions. Bibasilar opacities may reflect atelectasis or pneumonia. Electronically Signed   By: Davina Poke D.O.   On: 10/18/2022 12:23    Medications:  sodium chloride      (feeding supplement) PROSource Plus  30 mL Oral BID   aspirin EC  81 mg Oral Daily   carvedilol  3.125 mg Oral BID WC   Chlorhexidine Gluconate Cloth  6 each Topical Q0600   famotidine  10 mg Oral QHS   feeding supplement (NEPRO CARB STEADY)  237 mL Oral BID BM   heparin  5,000 Units Subcutaneous Q12H   heparin  6,000 Units Dialysis Once in dialysis   insulin aspart  0-9 Units Subcutaneous TID WC   insulin glargine-yfgn  20 Units Subcutaneous Daily   multivitamin  1 tablet Oral QHS   pantoprazole  40 mg Oral QAC breakfast   sodium chloride flush  3 mL Intravenous Q12H   sucroferric oxyhydroxide  1,000 mg Oral TID with meals    Dialysis Orders: [facility] 4h 71min  450/500  105.5kg  2/2 bath  Heparin 6000  LUA AVF - last HD 11/30, post wt 105.1kg  - hectorol 4 ug IV tiw tts -  no esa, last Hb 12.2  Assessment/Plan: A/C systolic CHF exacerbation - otherwise known as vol overload in an esrd patient. Last EF 45-50% from march. Received HD Sat -he's feeling better. PNA - started on ABX at admit for questionable PNA. Recent chest CT showed no infiltrates so PNA now r/o. Appears ABX were completed. ESRD - on HD TTS. S/p HD 12/2. He is feeling better and f/u CXR improved.Lower EDW at dc - appears 103kg would be reasonable. Next HD 12/5. 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 Cont velphoro as binder 1 gm ac tid.  OK for d/c from my perspective - asking to go so he can make it to [facility] at 2pm today - will alert primary.   Jannifer Hick MD [facility] Pager [phone]

## 2022-10-20 NOTE — Progress Notes (Signed)
Heart Failure Navigator Progress Note  Assessed for Heart & Vascular TOC clinic readiness.  Patient does not meet criteria due to HD on ESRD.   HF Navigation team will sign-off.   Pricilla Holm, MSN, RN Heart Failure Nurse Navigator

## 2022-10-20 NOTE — Discharge Summary (Addendum)
Physician Discharge Summary   Patient: [name] LOPEZMARTINEZ Sr. MRN: [id number] DOB: [birth year]  Admit date:     10/18/2022  Discharge date: 10/20/22  Discharge Physician: Jimmy Picket Winslow Verrill   PCP: Pcp, No   Recommendations at discharge:    Patient will continue renal replacement therapy tomorrow as outpatient, his dry weight will be decreased. Close follow up blood pressure as outpatient Follow up with primary care in 7 to 10 days.   Please continue supplemental 02 per [place] to keel 02 saturation 92% or greater.   Discharge Diagnoses: Principal Problem:   ESRD (end stage renal disease) ([place]) Active Problems:   Acute on chronic systolic CHF (congestive heart failure) (HCC)   HTN (hypertension)   DM2 (diabetes mellitus, type 2) (HCC)   CAD (coronary artery disease)   History of CVA    Below-knee amputation of right lower extremity (HCC)   Obesity (BMI 30-39.9)  Resolved Problems:   * No resolved hospital problems. [facility] Course: [name] [name] was admitted to the hospital with the working diagnosis of volume overload in the setting of ESRD.   61 yo male with the past medical history of ESRD on HD, heart failure, mild aortic stenosis, T2DM, peripheral vascular disease, sp right BKA who presented with dyspnea. Last HD 2 days prior to admission. At home he had worsening dyspnea and cough. On the day of admission his dyspnea was triggered by minimal efforts, he came to ED instead of  the HD outpatient unit. On his initial physical examination his blood pressure was 147/76, HR 95, RR 19 and 02 saturation 92%, lungs with rales bilaterally, with increased work of breathing, heart with S1 and S2 present and rhythmic, abdomen with no distention and positive lower extremity edema.   Na 137,K 4,8 Cl 96 bicarbonate 27, glucose 155 bun 37 cr 9,47  Wbc 11.3 hgb 11.9 plt 212  Sars covid 19 negative  High sensitive troponin 75 and 82   Chest radiograph with cardiomegaly, with bilateral hilar  vascular congestion and small bilateral pleural effusions.  CT chest with faint ground glass opacities bilaterally with small bilateral pleural effusions. Small pericardial effusion.   EKG 95 bpm, left axis deviation, incomplete left bundle branch block, sinus rhythm with q waves V1 to V3, poor R R wave progression, with no significant ST segment or T wave changes.   Patient underwent urgent hemodialysis with improvement in his symptoms.   12/04 patient will continue renal replacement therapy as outpatient, his dry weight will be lowered for better ultrafiltration.    Assessment and Plan: * ESRD (end stage renal disease) ([place]) Volume overload with acute hypoxemic respiratory failure due to acute pulmonary edema.  Follow up chest CT with no mnemonic infiltrates, ruled out pneumonia.   Patient had emergent HD with improvement in volume status, but not back to baseline, continue to have dyspnea.   Acute hypoxemic respiratory failure due to volume overload, on admission his saturation drop to 80s% on room air per ED documentation, At the time of his discharge his oxygenation at rest has been 97% on room air, with occasional desaturation when ambulating. Will recommend supplemental 02 per [place] to keep 02 saturation 92% or greater.   Anemia of chronic renal disease with iron deficiency, continue iron supplementation.  Metabolic bone disease, patient will continue with sevelamer.   Acute on chronic systolic CHF (congestive heart failure) ([place]) 03/23 Echocardiogram with preserved LV systolic function with EF 45 to 50%, global hypokinesis, moderate LVH, RV  systolic function is preserved, no pericardial effusion, with no significant valvular disease. Ct chest with mild pericardial effusion.   Volume status has improved with ultrafiltration but not back to baseline, continue renal replacement therapy as outpatient.   HTN (hypertension) Continue blood pressure control with amlodipine.  Follow up as  outpatient, may increase to 10 mg in case of persistent hypertension in the setting of optimal ultrafiltration.   DM2 (diabetes mellitus, type 2) ([place]) His glucose remained stable during his hospitalization, patient will continue with his insulin regimen.  Hi fasting glucose at the time of his discharge is 193 mg/dl   CAD (coronary artery disease) No chest pain, continue aspirin.  Elevated troponin due to volume overload, no clinical signs of acute coronary syndrome.   History of CVA  Continue aspirin and blood pressure control.   Below-knee amputation of right lower extremity [facility]) Patient uses a wheelchair for mobility as outpatient.   Obesity (BMI 30-39.9) Calculated BMI is 30,0 obesity class 1          Consultants: nephrology  Procedures performed: none   Disposition: Home Diet recommendation:  Cardiac diet DISCHARGE MEDICATION: Allergies as of 10/20/2022       Reactions   Sulfa Antibiotics Nausea And Vomiting, Other (See Comments)   "Allergic," per [facility]        Medication List     STOP taking these medications    amoxicillin-clavulanate 875-125 MG tablet Commonly known as: AUGMENTIN   multivitamin Tabs tablet   silver sulfADIAZINE 1 % cream Commonly known as: Silvadene       TAKE these medications    acetaminophen 325 MG tablet Commonly known as: TYLENOL Take 1-2 tablets (325-650 mg total) by mouth every 6 (six) hours as needed for mild pain (pain score 1-3 or temp > 100.5).   amLODipine 5 MG tablet Commonly known as: NORVASC Take 5 mg by mouth daily.   aspirin EC 81 MG tablet Take 1 tablet (81 mg total) by mouth daily. Swallow whole.   benzonatate 200 MG capsule Commonly known as: TESSALON Take 200 mg by mouth See admin instructions. Take one capsule by mouth two times a day every Tue, Thu, Sat per [facility].   ethyl chloride spray Apply 1 Application topically Every Tuesday,Thursday,and Saturday with dialysis.   famotidine 20 MG  tablet Commonly known as: PEPCID Take 20 mg by mouth at bedtime.   feeding supplement (NEPRO CARB STEADY) Liqd Take 237 mLs by mouth 2 (two) times daily between meals. What changed:  how much to take when to take this Another medication with the same name was removed. Continue taking this medication, and follow the directions you see here.   feeding supplement (PRO-STAT SUGAR FREE 64) Liqd Take 30 mLs by mouth 2 (two) times daily.   Pro-Stat AWC Liqd Take 30 mLs by mouth 2 (two) times daily.   guaiFENesin 100 MG/5ML liquid Commonly known as: ROBITUSSIN Take 10 mLs by mouth every 4 (four) hours as needed for cough.   hydrOXYzine 25 MG tablet Commonly known as: ATARAX Take 25 mg by mouth See admin instructions. Take one tablet by mouth on Tues, Thur, and Saturdays   insulin glargine 100 UNIT/ML injection Commonly known as: LANTUS Inject 0.32 mLs (32 Units total) into the skin at bedtime.   ipratropium-albuterol 0.5-2.5 (3) MG/3ML Soln Commonly known as: DUONEB Take 3 mLs by nebulization every 4 (four) hours as needed (for wheezing or shortness of breath).   loperamide 2 MG capsule Commonly known as:  IMODIUM Take 1 capsule (2 mg total) by mouth as needed for diarrhea or loose stools.   multivitamin with minerals Tabs tablet Take 1 tablet by mouth daily.   ondansetron 4 MG tablet Commonly known as: ZOFRAN Take 4 mg by mouth every 6 (six) hours as needed for nausea or vomiting.   oxyCODONE-acetaminophen 5-325 MG tablet Commonly known as: Percocet Take 1 tablet by mouth every 4 (four) hours as needed for severe pain.   pantoprazole 40 MG tablet Commonly known as: PROTONIX Take 1 tablet (40 mg total) by mouth daily at 12 noon. What changed: when to take this   senna-docusate 8.6-50 MG tablet Commonly known as: Senokot-S Take 1 tablet by mouth at bedtime as needed for mild constipation. What changed:  how much to take when to take this   sevelamer carbonate 800 MG  tablet Commonly known as: RENVELA Take 2,400 mg by mouth 3 (three) times daily. Take on Tues, Thurs, and Saturdays only per [facility]   SIMETHICONE PO Take 2 tablets by mouth every 8 (eight) hours as needed (for gas).        Discharge Exam: Filed Weights   10/18/22 2211 10/19/22 0546 10/20/22 0420  Weight: 103 kg 103.3 kg 104.1 kg   BP (!) 145/89 (BP Location: Right Arm)   Pulse 82   Temp 98.2 F (36.8 C) (Oral)   Resp 19   Ht 6\' 1"  (1.854 m)   Wt 104.1 kg   SpO2 97%   BMI 30.28 kg/m   Patient is feeling better, dyspnea and edema have improved.  Neurology awake and alert ENT with mild pallor Cardiovascular with S1 and S2 present and rhythmic with no gallops, rubs or murmurs. Respiratory with no rales or wheezing Abdomen with no distention  Condition at discharge: stable  The results of significant diagnostics from this hospitalization (including imaging, microbiology, ancillary and laboratory) are listed below for reference.   Imaging Studies: CT CHEST WO CONTRAST  Result Date: 10/19/2022 CLINICAL DATA:  Possible complicated pneumonia. EXAM: CT CHEST WITHOUT CONTRAST TECHNIQUE: Multidetector CT imaging of the chest was performed following the standard protocol without IV contrast. RADIATION DOSE REDUCTION: This exam was performed according to the departmental dose-optimization program which includes automated exposure control, adjustment of the mA and/or kV according to patient size and/or use of iterative reconstruction technique. COMPARISON:  Abdominal CT 08/05/2019 and chest x-ray 10/19/2022 FINDINGS: Cardiovascular: Mild cardiomegaly. Small pericardial effusion is present. Calcified plaque over the left main and 3 vessel coronary arteries. Mild calcification the region the mitral valve annulus and aortic valve. Thoracic aorta is normal in caliber, although there is minimal ill definition of the ascending thoracic aortic wall which may be due to the adjacent tracking  pericardial fluid. There is calcified plaque over the thoracic aorta. Calcified plaque over the descending thoracic aorta. Remaining mediastinal structures are unremarkable. Mediastinum/Nodes: No mediastinal or hilar adenopathy. Lungs/Pleura: Small left pleural effusion with associated basilar atelectasis. Mild compressive atelectasis over the right base with possible tiny amount of associated pleural fluid. Remainder of the lungs are clear. Airways are unremarkable. Upper Abdomen: Calcified plaque over the abdominal aorta. Minimal fluid over the left upper quadrant adjacent the spleen. 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 IMPRESSION: 1. Mild cardiomegaly with small pericardial effusion which is new compared to the prior exam. Slight ill definition of the ascending thoracic aorta which is otherwise normal in caliber. If clinical concern for aortic root integrity or aortic dissection, consider contrast-enhanced CT for further evaluation.  2. Small left pleural effusion with associated basilar atelectasis. Mild compressive atelectasis over the right base with possible tiny amount of associated pleural fluid. 3. Minimal free fluid over the left upper quadrant adjacent the spleen. 4. Aortic atherosclerosis. Atherosclerotic coronary artery disease. 5. 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 Aortic Atherosclerosis (ICD10-I70.0). These results will be called to the ordering clinician or representative by the Radiologist Assistant, and communication documented in the PACS or [employer]. Electronically Signed   By: Marin Olp M.D.   On: 10/19/2022 11:35   DG Chest 1 View  Result Date: 10/19/2022 CLINICAL DATA:  CHF EXAM: CHEST  1 VIEW COMPARISON:  Yesterday FINDINGS: Cardiopericardial enlargement with congested central vessels. Possible small left pleural effusion. No consolidation or air leak. Artifact from EKG leads. IMPRESSION: Mildly improved aeration at the right base. Similar  degree of vascular congestion. Electronically Signed   By: Jorje Guild M.D.   On: 10/19/2022 07:42   DG Chest 2 View  Result Date: 10/18/2022 CLINICAL DATA:  Chest pain EXAM: CHEST - 2 VIEW COMPARISON:  07/01/2022 FINDINGS: Stable cardiomegaly. Pulmonary vascular congestion with diffuse interstitial prominence. Trace bilateral pleural effusions. Patchy bibasilar opacities. No pneumothorax. IMPRESSION: 1. Cardiomegaly with pulmonary vascular congestion and interstitial edema. 2. Trace bilateral pleural effusions. Bibasilar opacities may reflect atelectasis or pneumonia. Electronically Signed   By: Davina Poke D.O.   On: 10/18/2022 12:23    Microbiology: Results for orders placed or performed during the hospital encounter of 10/18/22  Resp Panel by RT-PCR (Flu A&B, Covid) Anterior Nasal Swab     Status: None   Collection Time: 10/18/22  2:52 PM   Specimen: Anterior Nasal Swab  Result Value Ref Range Status   SARS Coronavirus 2 by RT PCR NEGATIVE NEGATIVE Final    Comment: (NOTE) SARS-CoV-2 target nucleic acids are NOT DETECTED.  The SARS-CoV-2 RNA is generally detectable in upper respiratory specimens during the acute phase of infection. The lowest concentration of SARS-CoV-2 viral copies this assay can detect is 138 copies/mL. A negative result does not preclude SARS-Cov-2 infection and should not be used as the sole basis for treatment or other patient management decisions. A negative result may occur with  improper specimen collection/handling, submission of specimen other than nasopharyngeal swab, presence of viral mutation(s) within the areas targeted by this assay, and inadequate number of viral copies(<138 copies/mL). A negative result must be combined with clinical observations, patient history, and epidemiological information. The expected result is Negative.  Fact Sheet for Patients:  EntrepreneurPulse.com.au  Fact Sheet for Healthcare Providers:   IncredibleEmployment.be  This test is no t yet approved or cleared by the [place] FDA and  has been authorized for detection and/or diagnosis of SARS-CoV-2 by FDA under an Emergency Use Authorization (EUA). This EUA will remain  in effect (meaning this test can be used) for the duration of the COVID-19 declaration under Section 564(b)(1) of the Act, 21 U.S.C.section 360bbb-3(b)(1), unless the authorization is terminated  or revoked sooner.       Influenza A by PCR NEGATIVE NEGATIVE Final   Influenza B by PCR NEGATIVE NEGATIVE Final    Comment: (NOTE) The Xpert Xpress SARS-CoV-2/FLU/RSV plus assay is intended as an aid in the diagnosis of influenza from Nasopharyngeal swab specimens and should not be used as a sole basis for treatment. Nasal washings and aspirates are unacceptable for Xpert Xpress SARS-CoV-2/FLU/RSV testing.  Fact Sheet for Patients: EntrepreneurPulse.com.au  Fact Sheet for Healthcare Providers: IncredibleEmployment.be  This test is not  yet approved or cleared by the [place] and has been authorized for detection and/or diagnosis of SARS-CoV-2 by FDA under an Emergency Use Authorization (EUA). This EUA will remain in effect (meaning this test can be used) for the duration of the COVID-19 declaration under Section 564(b)(1) of the Act, 21 U.S.C. section 360bbb-3(b)(1), unless the authorization is terminated or revoked.  Performed at [facility], [place] [address] [place], [place] [address]   MRSA Next Gen by PCR, Nasal     Status: None   Collection Time: 10/19/22  6:13 AM   Specimen: Nasal Mucosa; Nasal Swab  Result Value Ref Range Status   MRSA by PCR Next Gen NOT DETECTED NOT DETECTED Final    Comment: (NOTE) The GeneXpert MRSA Assay (FDA approved for NASAL specimens only), is one component of a comprehensive MRSA colonization surveillance program. It is not intended to diagnose  MRSA infection nor to guide or monitor treatment for MRSA infections. Test performance is not FDA approved in patients less than 66 years old. Performed at [facility], [place] [address], [place], [place] [address]     Labs: CBC: Recent Labs  Lab 10/18/22 1155 10/20/22 0106  WBC 11.3* 8.7  NEUTROABS  --  5.0  HGB 11.9* 11.0*  HCT 37.0* 32.9*  MCV 86.4 83.7  PLT 212 098   Basic Metabolic Panel: Recent Labs  Lab 10/18/22 1155 10/19/22 0038 10/20/22 0106  NA 137 135 131*  K 4.8 4.2 4.2  CL 96* 94* 92*  CO2 27 28 25   GLUCOSE 155* 125* 193*  BUN 37* 23* 43*  CREATININE 9.47* 6.69* 8.56*  CALCIUM 9.9 9.4 9.0  PHOS  --   --  5.1*   Liver Function Tests: No results for input(s): "AST", "ALT", "ALKPHOS", "BILITOT", "PROT", "ALBUMIN" in the last 168 hours. CBG: Recent Labs  Lab 10/19/22 0810 10/19/22 1146 10/19/22 1608 10/19/22 2127 10/20/22 0553  GLUCAP 114* 91 142* 210* 153*    Discharge time spent: greater than 30 minutes.  Signed: Tawni Millers, MD [facility] 10/20/2022

## 2022-10-20 NOTE — TOC Transition Note (Signed)
Transition of Care [facility]) - CM/SW Discharge Note   Patient Details  Name: [name] HICKEL Sr. MRN: [id number] Date of Birth: Jul 17, 1961  Transition of [facility]) CM/SW Contact:  Bethann Berkshire, [place] Phone Number: 10/20/2022, 12:31 PM   Clinical Narrative:     Patient will DC to: [place] Anticipated DC date: 10/20/2022 Family notified: Daughter Designer, multimedia by: Corey Harold   Per MD patient ready for DC to [facility]. RN, patient, patient's family, and facility notified of DC. Discharge Summary and FL2 sent to facility. RN to call report prior to discharge [phone] Room 224a). DC packet on chart. Ambulance transport requested for patient.   CSW will sign off for now as social work intervention is no longer needed. Please consult [place] again if new needs arise.   Final next level of care: Skilled Nursing Facility Barriers to Discharge: No Barriers Identified   Patient Goals and CMS Choice        Discharge Placement              Patient [facility] bed at: [facility] Patient to be transferred to facility by: [facility] Name of family member notified: Daughter Cierra Patient and family notified of of transfer: 10/20/22  Discharge Plan and Services                                     Social Determinants of Health (SDOH) Interventions     Readmission Risk Interventions     No data to display

## 2022-10-20 NOTE — NC FL2 (Signed)
[place] LEVEL OF CARE FORM     IDENTIFICATION  Patient Name: [name] DAMAN Sr. Birthdate: 06-Aug-1961 Sex: male Admission Date (Current Location): 10/18/2022  [facility] and [place] Number:  Herbalist and Address:  [facility], [place] [address] [place], [place] [address]      Provider Number: [id number]  Attending Physician Name and Address:  Tawni Millers  Relative Name and Phone Number:       Current Level of Care: Hospital Recommended Level of Care: [place] Prior Approval Number: [name],[name] (Daughter)  [phone] (Mobile)  Date Approved/Denied:   PASRR Number: [id number]  Discharge Plan: SNF    Current Diagnoses: Patient Active Problem List   Diagnosis Date Noted   Volume overload 10/19/2022   CHF (congestive heart failure) ([place]) 10/18/2022   Hyperosmolar hyperglycemic state (HHS) ([place])    Diarrhea    Dehiscence of amputation stump of right lower extremity (HCC)    Abscess of right lower leg    Pressure injury of skin 07/02/2022   Cellulitis of right leg 07/01/2022   CAD (coronary artery disease) 05/24/2022   Leukocytosis 05/24/2022   Below-knee amputation of right lower extremity ([place]) 05/23/2022   Obesity (BMI 30-39.9) 02/19/2022   History of CVA  02/05/2022   Carotid stenosis 02/05/2022   acute on chronic Anemia of chronic renal failure  02/03/2022   Subacute osteomyelitis, right ankle and foot ([place]) 65/46/5035   Acute metabolic encephalopathy 46/56/8127   Protein-calorie malnutrition, mild ([place]) 02/02/2022   GERD (gastroesophageal reflux disease) 02/02/2022   Acute on chronic systolic CHF (congestive heart failure) ([place]) 08/14/2019   LBBB (left bundle branch block) 08/13/2019   Problem with vascular access 08/13/2019   Dialysis AV fistula malfunction ([place]) 08/12/2019   ESRD (end stage renal disease) ([place]) 09/20/2018   DM2 (diabetes mellitus, type 2) ([place]) 09/20/2018   HTN  (hypertension) 09/20/2018    Orientation RESPIRATION BLADDER Height & Weight     Self, Time, Situation, Place  Normal Continent Weight: 229 lb 8 oz (104.1 kg) Height:  6\' 1"  (185.4 cm)  BEHAVIORAL SYMPTOMS/MOOD NEUROLOGICAL BOWEL NUTRITION STATUS      Continent Diet (see d/c summary)  AMBULATORY STATUS COMMUNICATION OF NEEDS Skin   Extensive Assist Verbally Normal                       Personal Care Assistance Level of Assistance  Bathing, Feeding, Dressing Bathing Assistance: Limited assistance Feeding assistance: Independent Dressing Assistance: Limited assistance     Functional Limitations Info  Sight, Speech, Hearing Sight Info: Adequate Hearing Info: Adequate Speech Info: Adequate    SPECIAL CARE FACTORS FREQUENCY                       Contractures Contractures Info: Not present    Additional Factors Info  Code Status, Allergies Code Status Info: Full code Allergies Info: sulf antibiotics           Current Medications (10/20/2022):  This is the current hospital active medication list Current Facility-Administered Medications  Medication Dose Route Frequency Provider Last Rate Last Admin   (feeding supplement) PROSource Plus liquid 30 mL  30 mL Oral BID Wynetta Fines T, MD   30 mL at 10/20/22 0906   0.9 %  sodium chloride infusion  250 mL Intravenous PRN Wynetta Fines T, MD       acetaminophen (TYLENOL) tablet 650 mg  650 mg  Oral Q4H PRN Lequita Halt, MD       aspirin EC tablet 81 mg  81 mg Oral Daily Wynetta Fines T, MD   81 mg at 10/20/22 4818   benzonatate (TESSALON) capsule 200 mg  200 mg Oral TID PRN Wynetta Fines T, MD   200 mg at 10/19/22 1230   carvedilol (COREG) tablet 3.125 mg  3.125 mg Oral BID WC Wynetta Fines T, MD   3.125 mg at 10/20/22 0608   Chlorhexidine Gluconate Cloth 2 % PADS 6 each  6 each Topical Q0600 Roney Jaffe, MD   6 each at 10/20/22 0609   famotidine (PEPCID) tablet 10 mg  10 mg Oral QHS Wynetta Fines T, MD   10 mg at 10/19/22  2100   feeding supplement (NEPRO CARB STEADY) liquid 237 mL  237 mL Oral BID BM Wynetta Fines T, MD   237 mL at 10/20/22 0906   guaiFENesin (ROBITUSSIN) 100 MG/5ML liquid 10 mL  10 mL Oral Q4H PRN Wynetta Fines T, MD   10 mL at 10/19/22 1659   heparin injection 5,000 Units  5,000 Units Subcutaneous Q12H Wynetta Fines T, MD   5,000 Units at 10/20/22 0906   heparin injection 6,000 Units  6,000 Units Dialysis Once in dialysis Roney Jaffe, MD       insulin aspart (novoLOG) injection 0-9 Units  0-9 Units Subcutaneous TID WC Lequita Halt, MD   2 Units at 10/20/22 0609   insulin glargine-yfgn (SEMGLEE) injection 20 Units  20 Units Subcutaneous Daily Wynetta Fines T, MD   20 Units at 10/20/22 0906   ipratropium-albuterol (DUONEB) 0.5-2.5 (3) MG/3ML nebulizer solution 3 mL  3 mL Nebulization Q4H PRN Wynetta Fines T, MD       loperamide (IMODIUM) capsule 2 mg  2 mg Oral PRN Wynetta Fines T, MD       multivitamin (RENA-VIT) tablet 1 tablet  1 tablet Oral QHS Wynetta Fines T, MD   1 tablet at 10/19/22 2100   ondansetron (ZOFRAN) injection 4 mg  4 mg Intravenous Q6H PRN Wynetta Fines T, MD       ondansetron [facility]) tablet 4 mg  4 mg Oral Q6H PRN Lequita Halt, MD       oxyCODONE-acetaminophen (PERCOCET/ROXICET) 5-325 MG per tablet 1 tablet  1 tablet Oral Q4H PRN Lequita Halt, MD   1 tablet at 10/19/22 0036   pantoprazole (PROTONIX) EC tablet 40 mg  40 mg Oral QAC breakfast Wynetta Fines T, MD   40 mg at 10/20/22 5631   senna-docusate (Senokot-S) tablet 1 tablet  1 tablet Oral QHS PRN Wynetta Fines T, MD       sodium chloride flush (NS) 0.9 % injection 3 mL  3 mL Intravenous Q12H Wynetta Fines T, MD   3 mL at 10/20/22 0909   sodium chloride flush (NS) 0.9 % injection 3 mL  3 mL Intravenous PRN Wynetta Fines T, MD       sucroferric oxyhydroxide [facility]) chewable tablet 1,000 mg  1,000 mg Oral TID with meals Wynetta Fines T, MD   1,000 mg at 10/20/22 4970     Discharge Medications: Please see discharge summary for a list of  discharge medications.  Relevant Imaging Results:  Relevant Lab Results:   Additional Information SS#: [id number]; HD pt  [place], LCSW

## 2022-10-29 ENCOUNTER — Encounter (HOSPITAL_COMMUNITY): Payer: Self-pay

## 2022-10-29 ENCOUNTER — Inpatient Hospital Stay (HOSPITAL_COMMUNITY): Payer: Medicare Other

## 2022-10-29 ENCOUNTER — Emergency Department (HOSPITAL_COMMUNITY): Payer: Medicare Other

## 2022-10-29 ENCOUNTER — Inpatient Hospital Stay (HOSPITAL_COMMUNITY)
Admission: EM | Admit: 2022-10-29 | Discharge: 2022-11-08 | DRG: 441 | Disposition: A | Discharge status: Skilled Nursing Facility | Payer: Medicare Other | Attending: Family Medicine | Admitting: Family Medicine

## 2022-10-29 ENCOUNTER — Other Ambulatory Visit: Payer: Self-pay

## 2022-10-29 DIAGNOSIS — E669 Obesity, unspecified: Secondary | ICD-10-CM | POA: Diagnosis present

## 2022-10-29 DIAGNOSIS — K8071 Calculus of gallbladder and bile duct without cholecystitis with obstruction: Secondary | ICD-10-CM

## 2022-10-29 DIAGNOSIS — I132 Hypertensive heart and chronic kidney disease with heart failure and with stage 5 chronic kidney disease, or end stage renal disease: Secondary | ICD-10-CM | POA: Diagnosis present

## 2022-10-29 DIAGNOSIS — K8020 Calculus of gallbladder without cholecystitis without obstruction: Secondary | ICD-10-CM | POA: Diagnosis present

## 2022-10-29 DIAGNOSIS — Z8249 Family history of ischemic heart disease and other diseases of the circulatory system: Secondary | ICD-10-CM

## 2022-10-29 DIAGNOSIS — E11649 Type 2 diabetes mellitus with hypoglycemia without coma: Secondary | ICD-10-CM | POA: Diagnosis present

## 2022-10-29 DIAGNOSIS — I5022 Chronic systolic (congestive) heart failure: Secondary | ICD-10-CM | POA: Diagnosis present

## 2022-10-29 DIAGNOSIS — E871 Hypo-osmolality and hyponatremia: Secondary | ICD-10-CM | POA: Diagnosis present

## 2022-10-29 DIAGNOSIS — I252 Old myocardial infarction: Secondary | ICD-10-CM

## 2022-10-29 DIAGNOSIS — Z7982 Long term (current) use of aspirin: Secondary | ICD-10-CM

## 2022-10-29 DIAGNOSIS — E1122 Type 2 diabetes mellitus with diabetic chronic kidney disease: Secondary | ICD-10-CM | POA: Diagnosis present

## 2022-10-29 DIAGNOSIS — E875 Hyperkalemia: Secondary | ICD-10-CM | POA: Diagnosis present

## 2022-10-29 DIAGNOSIS — E1151 Type 2 diabetes mellitus with diabetic peripheral angiopathy without gangrene: Secondary | ICD-10-CM | POA: Diagnosis present

## 2022-10-29 DIAGNOSIS — Z992 Dependence on renal dialysis: Secondary | ICD-10-CM | POA: Diagnosis not present

## 2022-10-29 DIAGNOSIS — Z794 Long term (current) use of insulin: Secondary | ICD-10-CM | POA: Diagnosis not present

## 2022-10-29 DIAGNOSIS — K831 Obstruction of bile duct: Secondary | ICD-10-CM | POA: Diagnosis present

## 2022-10-29 DIAGNOSIS — R4182 Altered mental status, unspecified: Secondary | ICD-10-CM | POA: Diagnosis not present

## 2022-10-29 DIAGNOSIS — R1011 Right upper quadrant pain: Principal | ICD-10-CM

## 2022-10-29 DIAGNOSIS — K219 Gastro-esophageal reflux disease without esophagitis: Secondary | ICD-10-CM | POA: Diagnosis present

## 2022-10-29 DIAGNOSIS — E119 Type 2 diabetes mellitus without complications: Secondary | ICD-10-CM

## 2022-10-29 DIAGNOSIS — I10 Essential (primary) hypertension: Secondary | ICD-10-CM | POA: Diagnosis present

## 2022-10-29 DIAGNOSIS — D631 Anemia in chronic kidney disease: Secondary | ICD-10-CM | POA: Diagnosis present

## 2022-10-29 DIAGNOSIS — Z8673 Personal history of transient ischemic attack (TIA), and cerebral infarction without residual deficits: Secondary | ICD-10-CM | POA: Diagnosis not present

## 2022-10-29 DIAGNOSIS — B179 Acute viral hepatitis, unspecified: Principal | ICD-10-CM | POA: Diagnosis present

## 2022-10-29 DIAGNOSIS — N186 End stage renal disease: Secondary | ICD-10-CM

## 2022-10-29 DIAGNOSIS — I2510 Atherosclerotic heart disease of native coronary artery without angina pectoris: Secondary | ICD-10-CM | POA: Diagnosis present

## 2022-10-29 DIAGNOSIS — Z89511 Acquired absence of right leg below knee: Secondary | ICD-10-CM | POA: Diagnosis not present

## 2022-10-29 DIAGNOSIS — I428 Other cardiomyopathies: Secondary | ICD-10-CM | POA: Diagnosis present

## 2022-10-29 DIAGNOSIS — Z6830 Body mass index (BMI) 30.0-30.9, adult: Secondary | ICD-10-CM

## 2022-10-29 DIAGNOSIS — Z79899 Other long term (current) drug therapy: Secondary | ICD-10-CM

## 2022-10-29 LAB — CBC WITH DIFFERENTIAL/PLATELET
Abs Immature Granulocytes: 0.04 10*3/uL (ref 0.00–0.07)
Basophils Absolute: 0.1 10*3/uL (ref 0.0–0.1)
Basophils Relative: 1 %
Eosinophils Absolute: 0.5 10*3/uL (ref 0.0–0.5)
Eosinophils Relative: 6 %
HCT: 32.2 % — ABNORMAL LOW (ref 39.0–52.0)
Hemoglobin: 10.5 g/dL — ABNORMAL LOW (ref 13.0–17.0)
Immature Granulocytes: 1 %
Lymphocytes Relative: 17 %
Lymphs Abs: 1.5 10*3/uL (ref 0.7–4.0)
MCH: 28.5 pg (ref 26.0–34.0)
MCHC: 32.6 g/dL (ref 30.0–36.0)
MCV: 87.3 fL (ref 80.0–100.0)
Monocytes Absolute: 1.9 10*3/uL — ABNORMAL HIGH (ref 0.1–1.0)
Monocytes Relative: 22 %
Neutro Abs: 4.7 10*3/uL (ref 1.7–7.7)
Neutrophils Relative %: 53 %
Platelets: 349 10*3/uL (ref 150–400)
RBC: 3.69 MIL/uL — ABNORMAL LOW (ref 4.22–5.81)
RDW: 22 % — ABNORMAL HIGH (ref 11.5–15.5)
WBC: 8.8 10*3/uL (ref 4.0–10.5)
nRBC: 0 % (ref 0.0–0.2)

## 2022-10-29 LAB — COMPREHENSIVE METABOLIC PANEL
ALT: 36 U/L (ref 0–44)
AST: 54 U/L — ABNORMAL HIGH (ref 15–41)
Albumin: 2.4 g/dL — ABNORMAL LOW (ref 3.5–5.0)
Alkaline Phosphatase: 276 U/L — ABNORMAL HIGH (ref 38–126)
Anion gap: 14 (ref 5–15)
BUN: 34 mg/dL — ABNORMAL HIGH (ref 6–20)
CO2: 28 mmol/L (ref 22–32)
Calcium: 9.4 mg/dL (ref 8.9–10.3)
Chloride: 96 mmol/L — ABNORMAL LOW (ref 98–111)
Creatinine, Ser: 8.06 mg/dL — ABNORMAL HIGH (ref 0.61–1.24)
GFR, Estimated: 7 mL/min — ABNORMAL LOW (ref >60)
Glucose, Bld: 55 mg/dL — ABNORMAL LOW (ref 70–99)
Potassium: 5.3 mmol/L — ABNORMAL HIGH (ref 3.5–5.1)
Sodium: 138 mmol/L (ref 135–145)
Total Bilirubin: 6.5 mg/dL — ABNORMAL HIGH (ref 0.3–1.2)
Total Protein: 8.5 g/dL — ABNORMAL HIGH (ref 6.5–8.1)

## 2022-10-29 LAB — LIPASE, BLOOD: Lipase: 22 U/L (ref 11–51)

## 2022-10-29 LAB — HEPATITIS PANEL, ACUTE
HCV Ab: NONREACTIVE
Hep A IgM: NONREACTIVE
Hep B C IgM: NONREACTIVE
Hepatitis B Surface Ag: NONREACTIVE

## 2022-10-29 LAB — AMMONIA: Ammonia: 78 umol/L — ABNORMAL HIGH (ref 9–35)

## 2022-10-29 LAB — PROTIME-INR
INR: 1.4 — ABNORMAL HIGH (ref 0.8–1.2)
Prothrombin Time: 16.7 seconds — ABNORMAL HIGH (ref 11.4–15.2)

## 2022-10-29 LAB — APTT: aPTT: 34 seconds (ref 24–36)

## 2022-10-29 LAB — BILIRUBIN, FRACTIONATED(TOT/DIR/INDIR)
Bilirubin, Direct: 4 mg/dL — ABNORMAL HIGH (ref 0.0–0.2)
Indirect Bilirubin: 2.6 mg/dL — ABNORMAL HIGH (ref 0.3–0.9)
Total Bilirubin: 6.6 mg/dL — ABNORMAL HIGH (ref 0.3–1.2)

## 2022-10-29 LAB — CBG MONITORING, ED
Glucose-Capillary: 101 mg/dL — ABNORMAL HIGH (ref 70–99)
Glucose-Capillary: 51 mg/dL — ABNORMAL LOW (ref 70–99)
Glucose-Capillary: 119 mg/dL — ABNORMAL HIGH (ref 70–99)

## 2022-10-29 LAB — LACTATE DEHYDROGENASE: LDH: 274 U/L — ABNORMAL HIGH (ref 98–192)

## 2022-10-29 MED ORDER — PROCHLORPERAZINE EDISYLATE 10 MG/2ML IJ SOLN
10.0000 mg | Freq: Once | INTRAMUSCULAR | Status: AC
  Administered 2022-10-29: 10 mg via INTRAVENOUS
  Filled 2022-10-29: qty 2

## 2022-10-29 MED ORDER — ASPIRIN 81 MG PO CHEW
81.0000 mg | CHEWABLE_TABLET | Freq: Every day | ORAL | Status: DC
  Administered 2022-10-31 – 2022-11-02 (×3): 81 mg via ORAL
  Filled 2022-10-29 (×3): qty 1

## 2022-10-29 MED ORDER — METHOCARBAMOL 500 MG PO TABS
500.0000 mg | ORAL_TABLET | Freq: Once | ORAL | Status: AC
  Administered 2022-10-29: 500 mg via ORAL
  Filled 2022-10-29: qty 1

## 2022-10-29 NOTE — ED Notes (Signed)
Patient transported to X-ray 

## 2022-10-29 NOTE — ED Notes (Signed)
ED TO INPATIENT HANDOFF REPORT  ED Nurse Name and Phone #: Carman Auxier RN [phone]  S Name/Age/Gender [name] [name] Sr. 61 y.o. male Room/Bed: 046C/046C  Code Status   Code Status: Prior  Home/SNF/Other Skilled nursing facility Patient oriented to: self, place, time, and situation Is this baseline? Yes   Triage Complete: Triage complete  Chief Complaint Cholelithiasis [K80.20]  Triage Note Pt BIB EMS due to neck pain. Pt is jaundice, has right BKA. Pt is from [facility]. Pt is in end stage CKD. NP would like liver enzymes checked. Pt on 1568ml fluid restriction. Pt had 5mg  of oxy at 0800. Pt is axox4. PT has dialysis Tuesday, Thursday, Saturday. Pt had full tx yesterday.    Allergies Allergies  Allergen Reactions   Sulfa Antibiotics Nausea And Vomiting and Other (See Comments)    "Allergic," per MAR    Level of Care/Admitting Diagnosis ED Disposition     ED Disposition  Admit   Condition  --   [place]: [place] [[id number]]  Level of Care: Med-Surg [16]  May admit patient to [facility] or [facility] if equivalent level of care is available:: No  Covid Evaluation: Asymptomatic - no recent exposure (last 10 days) testing not required  Diagnosis: Cholelithiasis [[id number]]  Admitting Physician: Orene Desanctis [[id number]]  Attending Physician: Orene Desanctis [[id number]]  Certification:: I certify this patient will need inpatient services for at least 2 midnights  Estimated Length of Stay: 2          B Medical/Surgery History Past Medical History:  Diagnosis Date   CHF (congestive heart failure) ([place])    ECHO 02/05/22  EF 45-50%, DD indeterminent   Diabetes mellitus without complication ([place])    type 2   ESRD on hemodialysis ([place])    mon wed fri   Hypertension    Kidney failure    Myocardial infarction ([place])    Nonischemic cardiomyopathy ([place])    Past Surgical History:  Procedure Laterality Date   A/V FISTULAGRAM N/A 08/15/2019    Procedure: A/V FISTULAGRAM;  Surgeon: [name], MD;  Location: [facility];  Service: Cardiovascular;  Laterality: N/A;   AMPUTATION Right 02/02/2022   Procedure: AMPUTATION 5th RAY;  Surgeon: [name], MD;  Location: [place];  Service: Podiatry;  Laterality: Right;   AMPUTATION Right 05/23/2022   Procedure: RIGHT BELOW KNEE AMPUTATION;  Surgeon: [name], MD;  Location: [place];  Service: Orthopedics;  Laterality: Right;   AMPUTATION Right 07/04/2022   Procedure: REVISION AMPUTATION BELOW KNEE;  Surgeon: [name], MD;  Location: [place];  Service: Orthopedics;  Laterality: Right;   APPLICATION OF WOUND VAC  05/23/2022   Procedure: APPLICATION OF WOUND VAC;  Surgeon: [name], MD;  Location: [facility] [place];  Service: Orthopedics;;   AV FISTULA PLACEMENT     CARDIAC CATHETERIZATION  02/17/2018   LHC 02/17/18 ([name]): 30% pLAD, 30% dRCA, LVEDP 25-30, LVEF 25%.     CATARACT EXTRACTION W/PHACO Right 03/29/2020   Procedure: CATARACT EXTRACTION PHACO AND INTRAOCULAR LENS PLACEMENT ([place])  RIGHT DIABETIC;  Surgeon: [name], MD;  Location: [facility];  Service: Ophthalmology;  Laterality: Right;  Lot # [id number] H [place]: 02:30.9 AP% 9.5% CDE: 14.38   EYE SURGERY Right    cataract removed   I & D EXTREMITY Right 02/02/2022   Procedure: IRRIGATION AND DEBRIDEMENT RIGHT FOOT;  Surgeon: [name], MD;  Location: [place];  Service: Podiatry;  Laterality: Right;   INCISION AND DRAINAGE OF WOUND Right 02/08/2022   Procedure: IRRIGATION AND DEBRIDEMENT WOUND;  Surgeon: [name], DPM;  Location: [place];  Service: Podiatry;  Laterality: Right;  with removal of infected bone    INSERTION OF DIALYSIS CATHETER  08/13/2019   Procedure: Insertion Of Dialysis Catheter;  Surgeon: [name], MD;  Location: [place];  Service: Vascular;;   INSERTION OF DIALYSIS CATHETER Left 07/19/2020   Procedure: INSERTION OF DIALYSIS CATHETER;  Surgeon: [name], MD;   Location: [place];  Service: Vascular;  Laterality: Left;   IRRIGATION AND DEBRIDEMENT ABSCESS     PERIPHERAL VASCULAR BALLOON ANGIOPLASTY  08/15/2019   Procedure: PERIPHERAL VASCULAR BALLOON ANGIOPLASTY;  Surgeon: [name], MD;  Location: [facility];  Service: Cardiovascular;;   PERIPHERAL VASCULAR BALLOON ANGIOPLASTY Left 02/06/2022   Procedure: PERIPHERAL VASCULAR BALLOON ANGIOPLASTY;  Surgeon: [name], MD;  Location: [facility];  Service: Cardiovascular;  Laterality: Left;   REVISON OF ARTERIOVENOUS FISTULA Left 07/19/2020   Procedure: REVISON/PLICATION OF ARTERIOVENOUS FISTULA LEFT;  Surgeon: [name], MD;  Location: [facility];  Service: Vascular;  Laterality: Left;   THROMBECTOMY AND REVISION OF ARTERIOVENTOUS (AV) GORETEX  GRAFT Left 08/13/2019   Procedure: THROMBECTOMY OF LEFT ARM ARTERIOVENTOUS (AV) FISTULA;  Surgeon: [name], MD;  Location[facility];  Service: Vascular;  Laterality: Left;   TOE AMPUTATION       A IV Location/Drains/Wounds Patient Lines/Drains/Airways Status     Active Line/Drains/Airways     Name Placement date Placement time Site Days   Peripheral IV 10/19/22 22 G 1" Anterior;Proximal;Right;Lateral Forearm 10/19/22  0128  Forearm  10   Peripheral IV 10/29/22 22 G Anterior;Right Hand 10/29/22  1436  Hand  less than 1   Fistula / Graft Left Forearm Arteriovenous fistula 09/22/15  0114  Forearm  2594   Negative Pressure Wound Therapy Knee Anterior;Right 05/23/22  1016  --  159   Negative Pressure Wound Therapy Leg Anterior;Right;Lower 07/04/22  1453  --  117   Incision (Closed) 08/13/19 Arm Left 08/13/19  1109  -- 1173   Incision (Closed) 08/13/19 Neck Left 08/13/19  1214  -- 1173   Incision (Closed) 08/13/19 Chest Left 08/13/19  1214  -- 1173   Incision (Closed) 03/29/20 Eye Right 03/29/20  0837  -- 944   Incision (Closed) 07/19/20 Chest Left 07/19/20  1007  -- 832   Incision (Closed) 07/19/20 Arm Left  07/19/20  1113  -- 832   Incision (Closed) 02/02/22 Foot Right 02/02/22  1251  -- 269   Incision (Closed) 02/08/22 Foot Right 02/08/22  0858  -- 263   Incision (Closed) 05/23/22 Leg Right 05/23/22  1006  -- 159   Incision (Closed) 07/04/22 Leg Right 07/04/22  1418  -- 117   Pressure Injury 07/02/22 Buttocks Left;Medial Stage 3 -  Full thickness tissue loss. Subcutaneous fat may be visible but bone, tendon or muscle are NOT exposed. 07/02/22  0058  -- 119   Wound / Incision (Open or Dehisced) 02/02/22 Diabetic ulcer Foot Anterior;Right 02/02/22  0723  Foot  269            Intake/Output Last 24 hours No intake or output data in the 24 hours ending 10/29/22 1914  Labs/Imaging Results for orders placed or performed during the hospital encounter of 10/29/22 (from the past 48 hour(s))  CBC with Differential     Status: Abnormal   Collection  Time: 10/29/22  2:38 PM  Result Value Ref Range   WBC 8.8 4.0 - 10.5 K/uL   RBC 3.69 (L) 4.22 - 5.81 MIL/uL   Hemoglobin 10.5 (L) 13.0 - 17.0 g/dL   HCT 32.2 (L) 39.0 - 52.0 %   MCV 87.3 80.0 - 100.0 fL   MCH 28.5 26.0 - 34.0 pg   MCHC 32.6 30.0 - 36.0 g/dL   RDW 22.0 (H) 11.5 - 15.5 %   Platelets 349 150 - 400 K/uL   nRBC 0.0 0.0 - 0.2 %   Neutrophils Relative % 53 %   Neutro Abs 4.7 1.7 - 7.7 K/uL   Lymphocytes Relative 17 %   Lymphs Abs 1.5 0.7 - 4.0 K/uL   Monocytes Relative 22 %   Monocytes Absolute 1.9 (H) 0.1 - 1.0 K/uL   Eosinophils Relative 6 %   Eosinophils Absolute 0.5 0.0 - 0.5 K/uL   Basophils Relative 1 %   Basophils Absolute 0.1 0.0 - 0.1 K/uL   WBC Morphology MORPHOLOGY UNREMARKABLE    RBC Morphology See Note    Smear Review PLATELET COUNT CONFIRMED BY SMEAR    Immature Granulocytes 1 %   Abs Immature Granulocytes 0.04 0.00 - 0.07 K/uL   Target Cells PRESENT     Comment: Performed at [facility], [address], [place], [place] [address]  Comprehensive metabolic panel     Status: Abnormal   Collection Time:  10/29/22  2:38 PM  Result Value Ref Range   Sodium 138 135 - 145 mmol/L   Potassium 5.3 (H) 3.5 - 5.1 mmol/L    Comment: HEMOLYSIS AT THIS LEVEL MAY AFFECT RESULT   Chloride 96 (L) 98 - 111 mmol/L   CO2 28 22 - 32 mmol/L   Glucose, Bld 55 (L) 70 - 99 mg/dL    Comment: Glucose reference range applies only to samples taken after fasting for at least 8 hours.   BUN 34 (H) 6 - 20 mg/dL   Creatinine, Ser 8.06 (H) 0.61 - 1.24 mg/dL   Calcium 9.4 8.9 - 10.3 mg/dL   Total Protein 8.5 (H) 6.5 - 8.1 g/dL   Albumin 2.4 (L) 3.5 - 5.0 g/dL   AST 54 (H) 15 - 41 U/L    Comment: HEMOLYSIS AT THIS LEVEL MAY AFFECT RESULT   ALT 36 0 - 44 U/L    Comment: HEMOLYSIS AT THIS LEVEL MAY AFFECT RESULT   Alkaline Phosphatase 276 (H) 38 - 126 U/L   Total Bilirubin 6.5 (H) 0.3 - 1.2 mg/dL    Comment: HEMOLYSIS AT THIS LEVEL MAY AFFECT RESULT   GFR, Estimated 7 (L) >60 mL/min    Comment: (NOTE) Calculated using the CKD-EPI Creatinine Equation (2021)    Anion gap 14 5 - 15    Comment: Performed at [facility], [place] [address], [place], [place] [address]  Protime-INR     Status: Abnormal   Collection Time: 10/29/22  2:38 PM  Result Value Ref Range   Prothrombin Time 16.7 (H) 11.4 - 15.2 seconds   INR 1.4 (H) 0.8 - 1.2    Comment: (NOTE) INR goal varies based on device and disease states. Performed at [facility], [place] [address], [place], [place] [address]   APTT     Status: None   Collection Time: 10/29/22  2:38 PM  Result Value Ref Range   aPTT 34 24 - 36 seconds    Comment: Performed at [facility],  [address], [place], [place] [address]  Lactate dehydrogenase     Status: Abnormal   Collection Time: 10/29/22  2:38 PM  Result Value Ref Range   LDH 274 (H) 98 - 192 U/L    Comment: HEMOLYSIS AT THIS LEVEL MAY AFFECT RESULT Performed at [facility], [place] [address] [place], [place] [address]   Ammonia     Status: Abnormal   Collection Time: 10/29/22  2:38 PM  Result  Value Ref Range   Ammonia 78 (H) 9 - 35 umol/L    Comment: Performed at [facility], [place] [address] [place], [place] [address]  Hepatitis panel, acute     Status: None   Collection Time: 10/29/22  2:38 PM  Result Value Ref Range   Hepatitis B Surface Ag NON REACTIVE NON REACTIVE   HCV Ab NON REACTIVE NON REACTIVE    Comment: (NOTE) Nonreactive HCV antibody screen is consistent with no HCV infections,  unless recent infection is suspected or other evidence exists to indicate HCV infection.     Hep A IgM NON REACTIVE NON REACTIVE   Hep B C IgM NON REACTIVE NON REACTIVE    Comment: Performed at [facility], [place] [address], [place], [place] [address]  CBG monitoring, ED     Status: Abnormal   Collection Time: 10/29/22  3:45 PM  Result Value Ref Range   Glucose-Capillary 51 (L) 70 - 99 mg/dL    Comment: Glucose reference range applies only to samples taken after fasting for at least 8 hours.  CBG monitoring, ED     Status: Abnormal   Collection Time: 10/29/22  4:16 PM  Result Value Ref Range   Glucose-Capillary 101 (H) 70 - 99 mg/dL    Comment: Glucose reference range applies only to samples taken after fasting for at least 8 hours.   DG Shoulder Left  Result Date: 10/29/2022 CLINICAL DATA:  Left shoulder pain no injury EXAM: LEFT SHOULDER - 2+ VIEW COMPARISON:  Left shoulder 02/06/2008 FINDINGS: There is no evidence of fracture or dislocation. There is no evidence of arthropathy or other focal bone abnormality. Vascular stent overlying the proximal humerus. IMPRESSION: Negative. Electronically Signed   By: Franchot Gallo M.D.   On: 10/29/2022 16:07   US Abdomen Limited RUQ (LIVER/GB)  Result Date: 10/29/2022 CLINICAL DATA:  Right upper quadrant pain for 1 week. EXAM: ULTRASOUND ABDOMEN LIMITED RIGHT UPPER QUADRANT COMPARISON:  None Available. FINDINGS: Gallbladder: Shadowing echogenic stones measure up to 1.5 cm. Associated wall thickening, 8 mm. Negative sonographic  Murphy sign. No pericholecystic fluid. Common bile duct: Diameter: Approximately 3 mm, within normal limits. No intrahepatic biliary ductal dilatation. Liver: Parenchymal echogenicity is within normal limits. No definite focal lesion. Portal vein is patent on color Doppler imaging with normal direction of blood flow towards the liver. Other: None. IMPRESSION: Cholelithiasis. Associated wall thickening without a positive sonographic Percell Miller sign can be seen in the setting of chronic cholecystitis. Electronically Signed   By: Lorin Picket M.D.   On: 10/29/2022 15:26    Pending Labs Unresulted Labs (From admission, onward)     Start     Ordered   10/29/22 1855  Lipase, blood  Once,   R        10/29/22 1854   10/29/22 1855  Bilirubin, fractionated(tot/dir/indir)  Once,   R        10/29/22 1854   10/29/22 1404  Acetaminophen level  Once,   STAT  10/29/22 1405            Vitals/Pain Today's Vitals   10/29/22 1618 10/29/22 1810 10/29/22 1819 10/29/22 1839  BP: 127/83 114/71    Pulse: 83 83    Resp: 18 (!) 23    Temp: 98.3 F (36.8 C)   98.5 F (36.9 C)  TempSrc:    Oral  SpO2: 98% 97% 97%   PainSc: 6        Isolation Precautions No active isolations  Medications Medications  methocarbamol (ROBAXIN) tablet 500 mg (500 mg Oral Given 10/29/22 1435)    Mobility manual wheelchair Moderate fall risk   Focused Assessments Cardiac Assessment Handoff:    No results found for: "CKTOTAL", "CKMB", "CKMBINDEX", "TROPONINI" No results found for: "DDIMER" Does the Patient currently have chest pain? No   , Renal Assessment Handoff:  Hemodialysis Schedule: Hemodialysis Schedule: Tuesday/Thursday/Saturday Last Hemodialysis date and time: 10/28/22   Restricted appendage: left arm  , Pulmonary Assessment Handoff:  Lung sounds:          R Recommendations: See Admitting Provider Note  Report given to:   Additional Notes:

## 2022-10-29 NOTE — Assessment & Plan Note (Signed)
-  insulin dependent but was hypoglycemic on arrival at 78. Will hold on SSI and follow CBG.

## 2022-10-29 NOTE — Consult Note (Signed)
Reason for consult: Jaundice; hyperbilirubinemia, ?choledocholithiasis  Requesting physician:  Carmin Muskrat, MD  HPI: [name] Cruel Sr. is an 61 y.o. male with hx of ESRD (on HD), PAD (s/p BKA), CAD, CHF, HTN, DM, GERD whom presented to the emergency department with neck pain and jaundice.  He was brought in by EMS.  He reports that he called 911 today because he was experiencing neck pain and vision changes.  He reports that he was "seeing a bright light."  He specifically denies any abdominal pain.  He denies any back pain.  He denies any nausea or vomiting, fever or chills.    Past Medical History:  Diagnosis Date   CHF (congestive heart failure) ([place])    ECHO 02/05/22  EF 45-50%, DD indeterminent   Diabetes mellitus without complication ([place])    type 2   ESRD on hemodialysis ([place])    mon wed fri   Hypertension    Kidney failure    Myocardial infarction ([place])    Nonischemic cardiomyopathy [facility])     Past Surgical History:  Procedure Laterality Date   A/V FISTULAGRAM N/A 08/15/2019   Procedure: A/V FISTULAGRAM;  Surgeon: [name], MD;  Location: [place] [facility];  Service: Cardiovascular;  Laterality: N/A;   AMPUTATION Right 02/02/2022   Procedure: AMPUTATION 5th RAY;  Surgeon: [name], MD;  Location: [place];  Service: Podiatry;  Laterality: Right;   AMPUTATION Right 05/23/2022   Procedure: RIGHT BELOW KNEE AMPUTATION;  Surgeon: [name], MD;  Location: [place];  Service: Orthopedics;  Laterality: Right;   AMPUTATION Right 07/04/2022   Procedure: REVISION AMPUTATION BELOW KNEE;  Surgeon: [name], MD;  Location: [place];  Service: Orthopedics;  Laterality: Right;   APPLICATION OF WOUND VAC  05/23/2022   Procedure: APPLICATION OF WOUND VAC;  Surgeon: [name], MD;  Location: [place];  Service: Orthopedics;;   AV FISTULA PLACEMENT     CARDIAC CATHETERIZATION  02/17/2018   LHC 02/17/18 ([name]): 30% pLAD, 30% dRCA, LVEDP 25-30, LVEF  25%.     CATARACT EXTRACTION W/PHACO Right 03/29/2020   Procedure: CATARACT EXTRACTION PHACO AND INTRAOCULAR LENS PLACEMENT ([place])  RIGHT DIABETIC;  Surgeon: [name], MD;  Location: [facility];  Service: Ophthalmology;  Laterality: Right;  Lot # [id number] H [place]: 02:30.9 AP% 9.5% CDE: 14.38   EYE SURGERY Right    cataract removed   I & D EXTREMITY Right 02/02/2022   Procedure: IRRIGATION AND DEBRIDEMENT RIGHT FOOT;  Surgeon: [name], MD;  Location: [place];  Service: Podiatry;  Laterality: Right;   INCISION AND DRAINAGE OF WOUND Right 02/08/2022   Procedure: IRRIGATION AND DEBRIDEMENT WOUND;  Surgeon: [name], DPM;  Location: [facility] [place];  Service: Podiatry;  Laterality: Right;  with removal of infected bone    INSERTION OF DIALYSIS CATHETER  08/13/2019   Procedure: Insertion Of Dialysis Catheter;  Surgeon: [name], MD;  Location: [place];  Service: Vascular;;   INSERTION OF DIALYSIS CATHETER Left 07/19/2020   Procedure: INSERTION OF DIALYSIS CATHETER;  Surgeon: [name], MD;  Location: [place];  Service: Vascular;  Laterality: Left;   IRRIGATION AND DEBRIDEMENT ABSCESS     PERIPHERAL VASCULAR BALLOON ANGIOPLASTY  08/15/2019   Procedure: PERIPHERAL VASCULAR BALLOON ANGIOPLASTY;  Surgeon: [name], MD;  Location: [place] [facility];  Service: Cardiovascular;;   PERIPHERAL VASCULAR BALLOON ANGIOPLASTY Left 02/06/2022   Procedure: PERIPHERAL VASCULAR BALLOON ANGIOPLASTY;  Surgeon: [name], MD;  Location: [place] [facility];  Service: Cardiovascular;  Laterality: Left;   REVISON OF ARTERIOVENOUS FISTULA Left 07/19/2020   Procedure: REVISON/PLICATION OF ARTERIOVENOUS FISTULA LEFT;  Surgeon: [name], MD;  Location: [facility];  Service: Vascular;  Laterality: Left;   THROMBECTOMY AND REVISION OF ARTERIOVENTOUS (AV) GORETEX  GRAFT Left 08/13/2019   Procedure: THROMBECTOMY OF LEFT ARM ARTERIOVENTOUS (AV) FISTULA;  Surgeon: [name],  MD;  [facility];  Service: Vascular;  Laterality: Left;   TOE AMPUTATION      Family History  Problem Relation Age of Onset   Hypertension Mother     Social:  reports that he has never smoked. He has never used smokeless tobacco. He reports that he does not currently use alcohol. He reports that he does not use drugs.  Allergies:  Allergies  Allergen Reactions   Sulfa Antibiotics Nausea And Vomiting and Other (See Comments)    "Allergic," per MAR    Medications: I have reviewed the patient's current medications.  Results for orders placed or performed during the hospital encounter of 10/29/22 (from the past 48 hour(s))  CBC with Differential     Status: Abnormal   Collection Time: 10/29/22  2:38 PM  Result Value Ref Range   WBC 8.8 4.0 - 10.5 K/uL   RBC 3.69 (L) 4.22 - 5.81 MIL/uL   Hemoglobin 10.5 (L) 13.0 - 17.0 g/dL   HCT 32.2 (L) 39.0 - 52.0 %   MCV 87.3 80.0 - 100.0 fL   MCH 28.5 26.0 - 34.0 pg   MCHC 32.6 30.0 - 36.0 g/dL   RDW 22.0 (H) 11.5 - 15.5 %   Platelets 349 150 - 400 K/uL   nRBC 0.0 0.0 - 0.2 %   Neutrophils Relative % 53 %   Neutro Abs 4.7 1.7 - 7.7 K/uL   Lymphocytes Relative 17 %   Lymphs Abs 1.5 0.7 - 4.0 K/uL   Monocytes Relative 22 %   Monocytes Absolute 1.9 (H) 0.1 - 1.0 K/uL   Eosinophils Relative 6 %   Eosinophils Absolute 0.5 0.0 - 0.5 K/uL   Basophils Relative 1 %   Basophils Absolute 0.1 0.0 - 0.1 K/uL   WBC Morphology MORPHOLOGY UNREMARKABLE    RBC Morphology See Note    Smear Review PLATELET COUNT CONFIRMED BY SMEAR    Immature Granulocytes 1 %   Abs Immature Granulocytes 0.04 0.00 - 0.07 K/uL   Target Cells PRESENT     Comment: Performed at [facility], [address], [place], [place] [address]  Comprehensive metabolic panel     Status: Abnormal   Collection Time: 10/29/22  2:38 PM  Result Value Ref Range   Sodium 138 135 - 145 mmol/L   Potassium 5.3 (H) 3.5 - 5.1 mmol/L    Comment: HEMOLYSIS AT THIS LEVEL MAY AFFECT  RESULT   Chloride 96 (L) 98 - 111 mmol/L   CO2 28 22 - 32 mmol/L   Glucose, Bld 55 (L) 70 - 99 mg/dL    Comment: Glucose reference range applies only to samples taken after fasting for at least 8 hours.   BUN 34 (H) 6 - 20 mg/dL   Creatinine, Ser 8.06 (H) 0.61 - 1.24 mg/dL   Calcium 9.4 8.9 - 10.3 mg/dL   Total Protein 8.5 (H) 6.5 - 8.1 g/dL   Albumin 2.4 (L) 3.5 - 5.0 g/dL   AST 54 (H) 15 - 41 U/L    Comment: HEMOLYSIS AT THIS LEVEL  MAY AFFECT RESULT   ALT 36 0 - 44 U/L    Comment: HEMOLYSIS AT THIS LEVEL MAY AFFECT RESULT   Alkaline Phosphatase 276 (H) 38 - 126 U/L   Total Bilirubin 6.5 (H) 0.3 - 1.2 mg/dL    Comment: HEMOLYSIS AT THIS LEVEL MAY AFFECT RESULT   GFR, Estimated 7 (L) >60 mL/min    Comment: (NOTE) Calculated using the CKD-EPI Creatinine Equation (2021)    Anion gap 14 5 - 15    Comment: Performed at [facility] [address] [place], [place] [address]  Protime-INR     Status: Abnormal   Collection Time: 10/29/22  2:38 PM  Result Value Ref Range   Prothrombin Time 16.7 (H) 11.4 - 15.2 seconds   INR 1.4 (H) 0.8 - 1.2    Comment: (NOTE) INR goal varies based on device and disease states. Performed at [facility], [place] [address], [place], [place] [address]   APTT     Status: None   Collection Time: 10/29/22  2:38 PM  Result Value Ref Range   aPTT 34 24 - 36 seconds    Comment: Performed at [facility] [address], [place], [place] [address]  Lactate dehydrogenase     Status: Abnormal   Collection Time: 10/29/22  2:38 PM  Result Value Ref Range   LDH 274 (H) 98 - 192 U/L    Comment: HEMOLYSIS AT THIS LEVEL MAY AFFECT RESULT Performed at [facility], [place] [address], [place], [place] [address]   Ammonia     Status: Abnormal   Collection Time: 10/29/22  2:38 PM  Result Value Ref Range   Ammonia 78 (H) 9 - 35 umol/L    Comment: Performed at [facility], [place] [address], [place],  [address]  CBG monitoring, ED      Status: Abnormal   Collection Time: 10/29/22  3:45 PM  Result Value Ref Range   Glucose-Capillary 51 (L) 70 - 99 mg/dL    Comment: Glucose reference range applies only to samples taken after fasting for at least 8 hours.  CBG monitoring, ED     Status: Abnormal   Collection Time: 10/29/22  4:16 PM  Result Value Ref Range   Glucose-Capillary 101 (H) 70 - 99 mg/dL    Comment: Glucose reference range applies only to samples taken after fasting for at least 8 hours.    DG Shoulder Left  Result Date: 10/29/2022 CLINICAL DATA:  Left shoulder pain no injury EXAM: LEFT SHOULDER - 2+ VIEW COMPARISON:  Left shoulder 02/06/2008 FINDINGS: There is no evidence of fracture or dislocation. There is no evidence of arthropathy or other focal bone abnormality. Vascular stent overlying the proximal humerus. IMPRESSION: Negative. Electronically Signed   By: Franchot Gallo M.D.   On: 10/29/2022 16:07   US Abdomen Limited RUQ (LIVER/GB)  Result Date: 10/29/2022 CLINICAL DATA:  Right upper quadrant pain for 1 week. EXAM: ULTRASOUND ABDOMEN LIMITED RIGHT UPPER QUADRANT COMPARISON:  None Available. FINDINGS: Gallbladder: Shadowing echogenic stones measure up to 1.5 cm. Associated wall thickening, 8 mm. Negative sonographic Murphy sign. No pericholecystic fluid. Common bile duct: Diameter: Approximately 3 mm, within normal limits. No intrahepatic biliary ductal dilatation. Liver: Parenchymal echogenicity is within normal limits. No definite focal lesion. Portal vein is patent on color Doppler imaging with normal direction of blood flow towards the liver. Other: None. IMPRESSION: Cholelithiasis. Associated wall thickening without a positive sonographic Murphy sign can  be seen in the setting of chronic cholecystitis. Electronically Signed   By: Lorin Picket M.D.   On: 10/29/2022 15:26    ROS - all of the below systems have been reviewed with the patient and positives are indicated with bold text General: chills, fever  or night sweats Eyes: blurry vision or double vision ENT: epistaxis or sore throat Allergy/Immunology: itchy/watery eyes or nasal congestion Hematologic/Lymphatic: bleeding problems, blood clots or swollen lymph nodes Endocrine: temperature intolerance or unexpected weight changes Breast: new or changing breast lumps or nipple discharge Resp: cough, shortness of breath, or wheezing CV: chest pain or dyspnea on exertion GI: as per HPI GU: dysuria, trouble voiding, or hematuria MSK: joint pain or joint stiffness Neuro: TIA or stroke symptoms Derm: pruritus and skin lesion changes Psych: anxiety and depression  PE Blood pressure 127/83, pulse 83, temperature 98.3 F (36.8 C), resp. rate 18, SpO2 98 %. Constitutional: NAD; conversant Eyes: Moist conjunctiva; sclera do not appear overtly jaundiced Neck: Trachea midline; no thyromegaly Lungs: Normal respiratory effort CV: RRR GI: Abd obese, soft, nontender throughout Psychiatric: Appropriate affect  Results for orders placed or performed during the hospital encounter of 10/29/22 (from the past 48 hour(s))  CBC with Differential     Status: Abnormal   Collection Time: 10/29/22  2:38 PM  Result Value Ref Range   WBC 8.8 4.0 - 10.5 K/uL   RBC 3.69 (L) 4.22 - 5.81 MIL/uL   Hemoglobin 10.5 (L) 13.0 - 17.0 g/dL   HCT 32.2 (L) 39.0 - 52.0 %   MCV 87.3 80.0 - 100.0 fL   MCH 28.5 26.0 - 34.0 pg   MCHC 32.6 30.0 - 36.0 g/dL   RDW 22.0 (H) 11.5 - 15.5 %   Platelets 349 150 - 400 K/uL   nRBC 0.0 0.0 - 0.2 %   Neutrophils Relative % 53 %   Neutro Abs 4.7 1.7 - 7.7 K/uL   Lymphocytes Relative 17 %   Lymphs Abs 1.5 0.7 - 4.0 K/uL   Monocytes Relative 22 %   Monocytes Absolute 1.9 (H) 0.1 - 1.0 K/uL   Eosinophils Relative 6 %   Eosinophils Absolute 0.5 0.0 - 0.5 K/uL   Basophils Relative 1 %   Basophils Absolute 0.1 0.0 - 0.1 K/uL   WBC Morphology MORPHOLOGY UNREMARKABLE    RBC Morphology See Note    Smear Review PLATELET COUNT  CONFIRMED BY SMEAR    Immature Granulocytes 1 %   Abs Immature Granulocytes 0.04 0.00 - 0.07 K/uL   Target Cells PRESENT     Comment: Performed at [facility], [address] [place], [place] [address]  Comprehensive metabolic panel     Status: Abnormal   Collection Time: 10/29/22  2:38 PM  Result Value Ref Range   Sodium 138 135 - 145 mmol/L   Potassium 5.3 (H) 3.5 - 5.1 mmol/L    Comment: HEMOLYSIS AT THIS LEVEL MAY AFFECT RESULT   Chloride 96 (L) 98 - 111 mmol/L   CO2 28 22 - 32 mmol/L   Glucose, Bld 55 (L) 70 - 99 mg/dL    Comment: Glucose reference range applies only to samples taken after fasting for at least 8 hours.   BUN 34 (H) 6 - 20 mg/dL   Creatinine, Ser 8.06 (H) 0.61 - 1.24 mg/dL   Calcium 9.4 8.9 - 10.3 mg/dL   Total Protein 8.5 (H) 6.5 - 8.1 g/dL   Albumin 2.4 (L) 3.5 - 5.0 g/dL  AST 54 (H) 15 - 41 U/L    Comment: HEMOLYSIS AT THIS LEVEL MAY AFFECT RESULT   ALT 36 0 - 44 U/L    Comment: HEMOLYSIS AT THIS LEVEL MAY AFFECT RESULT   Alkaline Phosphatase 276 (H) 38 - 126 U/L   Total Bilirubin 6.5 (H) 0.3 - 1.2 mg/dL    Comment: HEMOLYSIS AT THIS LEVEL MAY AFFECT RESULT   GFR, Estimated 7 (L) >60 mL/min    Comment: (NOTE) Calculated using the CKD-EPI Creatinine Equation (2021)    Anion gap 14 5 - 15    Comment: Performed at [facility] [address] [place], [place] [address]  Protime-INR     Status: Abnormal   Collection Time: 10/29/22  2:38 PM  Result Value Ref Range   Prothrombin Time 16.7 (H) 11.4 - 15.2 seconds   INR 1.4 (H) 0.8 - 1.2    Comment: (NOTE) INR goal varies based on device and disease states. Performed at [facility], [place] [address] [place], [place] [address]   APTT     Status: None   Collection Time: 10/29/22  2:38 PM  Result Value Ref Range   aPTT 34 24 - 36 seconds    Comment: Performed at [facility] [address], [place], [place] [address]  Lactate dehydrogenase     Status: Abnormal   Collection  Time: 10/29/22  2:38 PM  Result Value Ref Range   LDH 274 (H) 98 - 192 U/L    Comment: HEMOLYSIS AT THIS LEVEL MAY AFFECT RESULT Performed at [facility], [place] [address], [place], [place] [address]   Ammonia     Status: Abnormal   Collection Time: 10/29/22  2:38 PM  Result Value Ref Range   Ammonia 78 (H) 9 - 35 umol/L    Comment: Performed at [facility], [place] [address] [place], [place] [address]  CBG monitoring, ED     Status: Abnormal   Collection Time: 10/29/22  3:45 PM  Result Value Ref Range   Glucose-Capillary 51 (L) 70 - 99 mg/dL    Comment: Glucose reference range applies only to samples taken after fasting for at least 8 hours.  CBG monitoring, ED     Status: Abnormal   Collection Time: 10/29/22  4:16 PM  Result Value Ref Range   Glucose-Capillary 101 (H) 70 - 99 mg/dL    Comment: Glucose reference range applies only to samples taken after fasting for at least 8 hours.    DG Shoulder Left  Result Date: 10/29/2022 CLINICAL DATA:  Left shoulder pain no injury EXAM: LEFT SHOULDER - 2+ VIEW COMPARISON:  Left shoulder 02/06/2008 FINDINGS: There is no evidence of fracture or dislocation. There is no evidence of arthropathy or other focal bone abnormality. Vascular stent overlying the proximal humerus. IMPRESSION: Negative. Electronically Signed   By: Franchot Gallo M.D.   On: 10/29/2022 16:07   US Abdomen Limited RUQ (LIVER/GB)  Result Date: 10/29/2022 CLINICAL DATA:  Right upper quadrant pain for 1 week. EXAM: ULTRASOUND ABDOMEN LIMITED RIGHT UPPER QUADRANT COMPARISON:  None Available. FINDINGS: Gallbladder: Shadowing echogenic stones measure up to 1.5 cm. Associated wall thickening, 8 mm. Negative sonographic Murphy sign. No pericholecystic fluid. Common bile duct: Diameter: Approximately 3 mm, within normal limits. No intrahepatic biliary ductal dilatation. Liver: Parenchymal echogenicity is within normal limits. No definite focal lesion. Portal vein is patent on  color Doppler imaging with normal direction of blood flow towards  the liver. Other: None. IMPRESSION: Cholelithiasis. Associated wall thickening without a positive sonographic Percell Miller sign can be seen in the setting of chronic cholecystitis. Electronically Signed   By: Lorin Picket M.D.   On: 10/29/2022 15:26    A/P: SHAMON COTHRAN Sr. is an 61 y.o. male with hx ESRD (on HD), PAD (s/p BKA), CAD, CHF, HTN, DM, GERD here with neck pain and vision changes -- incidentally found to have hyperbilirubinemia with gallstones  -Check lipase -Fractionate bilirubin  -His presenting symptoms were that of neck pain and vision changes/seeing bright lights.  Would workup for other etiologies as per EDP and internal medicine.  -He has an elevated ammonia, hyperbilirubinemia, normal WBC, INR 1.4.  His ultrasound of his right upper quadrant showed gallstones with gallbladder wall measuring 8 mm without pericholecystic fluid.  He has no abdominal pain.  He has no tenderness on exam. Constellation of findings seem atypical for any sort of choledocholithiasis but would recommend GI evaluation for further workup  -We will follow with you  I spent a total of 65 minutes in both face-to-face and non-face-to-face activities, excluding procedures performed, for this visit on the date of this encounter.  Nadeen Landau, [facility], [place]

## 2022-10-29 NOTE — Assessment & Plan Note (Signed)
-  bilirubin of 6.5 on presentation with gallstone seen on RUQ ultrasound with Jaundice concerning for obstruction pattern, possible choledocholithiasis -General surgery has consulted and recommended GI consult -GI Dr. Modena Nunnery requested MRCP abdomen which is pending -fractionated Tbili and Lipase pending

## 2022-10-29 NOTE — ED Notes (Signed)
Admitting provider at bedside at this time to evaluate patient and update them on plan of care.

## 2022-10-29 NOTE — Assessment & Plan Note (Signed)
-  continue daily aspirin -Symptoms of visual aura and frontal headache more likely migraine. Will treat with IV compazine and follow clinically. No focal neurological findings on exam to warrant further imaging for now.

## 2022-10-29 NOTE — ED Notes (Signed)
Patient provided with orange juice and grahams at this time

## 2022-10-29 NOTE — Assessment & Plan Note (Signed)
-   Mild with K of 5.3 on presentation -will receive schedule dialysis tomorrow

## 2022-10-29 NOTE — H&P (Signed)
History and Physical    Patient: [name] [name]. WUJ:[id number] DOB: [birth year] DOA: 10/29/2022 DOS: the patient was seen and examined on 10/29/2022 PCP: Pcp, No  Patient coming from: SNF  Chief Complaint:  Chief Complaint  Patient presents with   Neck Pain   HPI: [name] [name]. is a 61 y.o. male with medical history significant of ESRD on HD TTS, HFrEF, T2DM, PVD s/p right BKA, HTN who presents with neck pain, visual aura, abdominal pain.  Patient reports having epigastric and left upper quadrant abdominal pain for the past 3 weeks.  Has had intermittent nausea, vomiting and diarrhea.  Reportedly already had ultrasound outpatient about 3 weeks ago showing gallstones and was getting workup outpatient.  Over the past week, also developed left lateral neck pain worse with head rotation.  Today also developed frontal headache and seeing bright lights with his eyes.  No vision loss.  No extremity weakness.  He is compliant with aspirin for his history of CVA.  Has not missed any dialysis.   In the ED, he was afebrile and normotensive on room air.  No leukocytosis.  Creatinine is at 8 which is around his baseline of 6-9 with his ESRD. Alk phos was elevated to 76 with total bilirubin of 6.5.  AST mildly elevated 54, ALT of 36.  Right upper quadrant ultrasound shows cholelithiasis but no positive sonographic Murphy sign.  No wall thickening to suggest cholecystitis.  General surgery has evaluated and recommends GI consult.  EDP is discussed with GI Dr. Modena Nunnery who recommends MRCP and will see in consultation in the morning.  Review of Systems: As mentioned in the history of present illness. All other systems reviewed and are negative. Past Medical History:  Diagnosis Date   CHF (congestive heart failure) ([place])    ECHO 02/05/22  EF 45-50%, DD indeterminent   Diabetes mellitus without complication ([place])    type 2   ESRD on hemodialysis ([place])    mon wed fri   Hypertension    Kidney  failure    Myocardial infarction ([place])    Nonischemic cardiomyopathy [facility])    Past Surgical History:  Procedure Laterality Date   A/V FISTULAGRAM N/A 08/15/2019   Procedure: A/V FISTULAGRAM;  Surgeon: [name], MD;  Location: [place] [facility];  Service: Cardiovascular;  Laterality: N/A;   AMPUTATION Right 02/02/2022   Procedure: AMPUTATION 5th RAY;  Surgeon: [name], MD;  Location: [place];  Service: Podiatry;  Laterality: Right;   AMPUTATION Right 05/23/2022   Procedure: RIGHT BELOW KNEE AMPUTATION;  Surgeon: [name], MD;  Location: [place];  Service: Orthopedics;  Laterality: Right;   AMPUTATION Right 07/04/2022   Procedure: REVISION AMPUTATION BELOW KNEE;  Surgeon: [name], MD;  Location: [place];  Service: Orthopedics;  Laterality: Right;   APPLICATION OF WOUND VAC  05/23/2022   Procedure: APPLICATION OF WOUND VAC;  Surgeon: [name], MD;  Location: [place];  Service: Orthopedics;;   AV FISTULA PLACEMENT     CARDIAC CATHETERIZATION  02/17/2018   LHC 02/17/18 ([name]): 30% pLAD, 30% dRCA, LVEDP 25-30, LVEF 25%.     CATARACT EXTRACTION W/PHACO Right 03/29/2020   Procedure: CATARACT EXTRACTION PHACO AND INTRAOCULAR LENS PLACEMENT ([place])  RIGHT DIABETIC;  Surgeon: [name], MD;  Location: [facility];  Service: Ophthalmology;  Laterality: Right;  Lot # [id number] H [place]: 02:30.9 AP% 9.5% CDE: 14.38   EYE SURGERY Right    cataract removed   I &  D EXTREMITY Right 02/02/2022   Procedure: IRRIGATION AND DEBRIDEMENT RIGHT FOOT;  Surgeon: [name], MD;  Location: [place];  Service: Podiatry;  Laterality: Right;   INCISION AND DRAINAGE OF WOUND Right 02/08/2022   Procedure: IRRIGATION AND DEBRIDEMENT WOUND;  Surgeon: [name], DPM;  Location: [place];  Service: Podiatry;  Laterality: Right;  with removal of infected bone    INSERTION OF DIALYSIS CATHETER  08/13/2019   Procedure: Insertion Of Dialysis Catheter;  Surgeon: [name], MD;   Location: [place];  Service: Vascular;;   INSERTION OF DIALYSIS CATHETER Left 07/19/2020   Procedure: INSERTION OF DIALYSIS CATHETER;  Surgeon: [name], MD;  Location: [place];  Service: Vascular;  Laterality: Left;   IRRIGATION AND DEBRIDEMENT ABSCESS     PERIPHERAL VASCULAR BALLOON ANGIOPLASTY  08/15/2019   Procedure: PERIPHERAL VASCULAR BALLOON ANGIOPLASTY;  Surgeon: [name], MD;  Location: [place] [facility];  Service: Cardiovascular;;   PERIPHERAL VASCULAR BALLOON ANGIOPLASTY Left 02/06/2022   Procedure: PERIPHERAL VASCULAR BALLOON ANGIOPLASTY;  Surgeon: [name], MD;  Location: [place] [facility];  Service: Cardiovascular;  Laterality: Left;   REVISON OF ARTERIOVENOUS FISTULA Left 07/19/2020   Procedure: REVISON/PLICATION OF ARTERIOVENOUS FISTULA LEFT;  Surgeon: [name], MD;  [facility];  Service: Vascular;  Laterality: Left;   THROMBECTOMY AND REVISION OF ARTERIOVENTOUS (AV) GORETEX  GRAFT Left 08/13/2019   Procedure: THROMBECTOMY OF LEFT ARM ARTERIOVENTOUS (AV) FISTULA;  Surgeon: [name], MD;  Location: [place];  Service: Vascular;  Laterality: Left;   TOE AMPUTATION     Social History:  reports that he has never smoked. He has never used smokeless tobacco. He reports that he does not currently use alcohol. He reports that he does not use drugs.  Allergies  Allergen Reactions   Sulfa Antibiotics Nausea And Vomiting and Other (See Comments)    "Allergic," per [facility]    Family History  Problem Relation Age of Onset   Hypertension Mother     Prior to Admission medications   Medication Sig Start Date End Date Taking? Authorizing Provider  acetaminophen (TYLENOL) 325 MG tablet Take 1-2 tablets (325-650 mg total) by mouth every 6 (six) hours as needed for mild pain (pain score 1-3 or temp > 100.5). 07/08/22   Eugenie Filler, MD  Amino Acids-Protein Hydrolys (FEEDING SUPPLEMENT, PRO-STAT SUGAR FREE 64,) LIQD Take 30 mLs by  mouth 2 (two) times daily.    [provider]  Amino Acids-Protein Hydrolys (PRO-STAT AWC) LIQD Take 30 mLs by mouth 2 (two) times daily.    [provider]  amLODipine (NORVASC) 5 MG tablet Take 5 mg by mouth daily. 07/15/22   [provider]  aspirin EC 81 MG EC tablet Take 1 tablet (81 mg total) by mouth daily. Swallow whole. 02/21/22   Raiford Noble Latif, DO  benzonatate (TESSALON) 200 MG capsule Take 200 mg by mouth See admin instructions. Take one capsule by mouth two times a day every Tue, Thu, Sat per [facility].    [provider]  ethyl chloride spray Apply 1 Application topically Every Tuesday,Thursday,and Saturday with dialysis.    [provider]  famotidine (PEPCID) 20 MG tablet Take 20 mg by mouth at bedtime.    [provider]  guaiFENesin (ROBITUSSIN) 100 MG/5ML liquid Take 10 mLs by mouth every 4 (four) hours as needed for cough.    [provider]  hydrOXYzine (ATARAX) 25 MG tablet Take 25 mg by  mouth See admin instructions. Take one tablet by mouth on Tues, Thur, and Saturdays    [provider]  insulin glargine (LANTUS) 100 UNIT/ML injection Inject 0.32 mLs (32 Units total) into the skin at bedtime. 07/08/22   Eugenie Filler, MD  ipratropium-albuterol (DUONEB) 0.5-2.5 (3) MG/3ML SOLN Take 3 mLs by nebulization every 4 (four) hours as needed (for wheezing or shortness of breath).    [provider]  loperamide (IMODIUM) 2 MG capsule Take 1 capsule (2 mg total) by mouth as needed for diarrhea or loose stools. 07/08/22   Eugenie Filler, MD  Multiple Vitamin (MULTIVITAMIN WITH MINERALS) TABS tablet Take 1 tablet by mouth daily.    [provider]  Nutritional Supplements (FEEDING SUPPLEMENT, NEPRO CARB STEADY,) LIQD Take 237 mLs by mouth 2 (two) times daily between meals. 10/20/22 11/19/22  Arrien, Jimmy Picket, MD  ondansetron (ZOFRAN) 4 MG tablet Take 4 mg by mouth every 6 (six) hours as needed  for nausea or vomiting. 06/30/22   [provider]  oxyCODONE-acetaminophen (PERCOCET) 5-325 MG tablet Take 1 tablet by mouth every 6 (six) hours as needed for severe pain. 10/20/22 10/20/23  Arrien, Jimmy Picket, MD  pantoprazole (PROTONIX) 40 MG tablet Take 1 tablet (40 mg total) by mouth daily at 12 noon. Patient taking differently: Take 40 mg by mouth daily before breakfast. 02/21/22   Raiford Noble Latif, DO  senna-docusate (SENOKOT-S) 8.6-50 MG tablet Take 1 tablet by mouth at bedtime as needed for mild constipation. Patient taking differently: Take 2 tablets by mouth at bedtime. 07/08/22   Eugenie Filler, MD  sevelamer carbonate (RENVELA) 800 MG tablet Take 2,400 mg by mouth 3 (three) times daily. Take on Tues, Thurs, and Saturdays only per [facility]    [provider]  SIMETHICONE PO Take 2 tablets by mouth every 8 (eight) hours as needed (for gas).    [provider]    Physical Exam: Vitals:   10/29/22 1819 10/29/22 1839 10/29/22 1900 10/29/22 1915  BP:   113/83   Pulse:   86 85  Resp:    15  Temp:  98.5 F (36.9 C)    TempSrc:  Oral    SpO2: 97%  93% 97%   Constitutional: NAD, calm, comfortable, obese male laying flat in bed mostly with eyes closed Eyes: PERRL, lids normal with scleral icterus.   ENMT: Mucous membranes are moist.  Neck: normal, supple.  Pain with palpation of the left sternocleidomastoid and trapezius.  Pain to left lateral neck worse with right head rotation. Respiratory: clear to auscultation bilaterally, no wheezing, no crackles. Normal respiratory effort. No accessory muscle use.  Cardiovascular: Regular rate and rhythm, no murmurs / rubs / gallops. No extremity edema.  Abdomen: no tenderness, Bowel sounds positive.  Musculoskeletal: no clubbing / cyanosis. No joint deformity upper and lower extremities. Normal muscle tone. Left UE AV fistula Skin: no rashes, lesions, ulcers.  Neurologic: CN 2-12 grossly intact.  Strength 5/5 in all  4.  Psychiatric: Normal judgment and insight. Alert and oriented x 3. Normal mood. Data Reviewed:  See HPI  Assessment and Plan: * Hyperbilirubinemia -bilirubin of 6.5 on presentation with gallstone seen on RUQ ultrasound with Jaundice concerning for obstruction pattern, possible choledocholithiasis -General surgery has consulted and recommended GI consult -GI Dr. Modena Nunnery requested MRCP abdomen which is pending -fractionated Tbili and Lipase pending   ESRD (end stage renal disease) ([place]) -TTS. No missed HD -creatinine stable -need nephrology consult tomorrow for scheduled  dialysis  HTN (hypertension) Stable. Continue home meds pending med rec.  DM2 (diabetes mellitus, type 2) (H[facility]) -insulin dependent but was hypoglycemic on arrival at 20. Will hold on SSI and follow CBG.  History of CVA  -continue daily aspirin -Symptoms of visual aura and frontal headache more likely migraine. Will treat with IV compazine and follow clinically. No focal neurological findings on exam to warrant further imaging for now.  Hyperkalemia - Mild with K of 5.3 on presentation -will receive schedule dialysis tomorrow      Advance Care Planning: Full  Consults: General surgery, GI  Family Communication: Discussed with his daughter over the phone   Severity of Illness: The appropriate patient status for this patient is INPATIENT. Inpatient status is judged to be reasonable and necessary in order to provide the required intensity of service to ensure the patient's safety. The patient's presenting symptoms, physical exam findings, and initial radiographic and laboratory data in the context of their chronic comorbidities is felt to place them at high risk for further clinical deterioration. Furthermore, it is not anticipated that the patient will be medically stable for discharge from the hospital within 2 midnights of admission.   * I certify that at the point of admission it is my clinical judgment  that the patient will require inpatient hospital care spanning beyond 2 midnights from the point of admission due to high intensity of service, high risk for further deterioration and high frequency of surveillance required.*  Author: Orene Desanctis, DO 10/29/2022 8:08 PM  For on call review [email or web address].

## 2022-10-29 NOTE — ED Notes (Signed)
Patient changed into hospital gown and placed on cardiac monitor at this time. Patient also had 1 bowel movement and urine occurrence. This RN changed patient into clean brief and bed pad with assistance from ED tech. Patient tolerated well.

## 2022-10-29 NOTE — Assessment & Plan Note (Signed)
-  TTS. No missed HD -creatinine stable -need nephrology consult tomorrow for scheduled dialysis

## 2022-10-29 NOTE — ED Provider Notes (Addendum)
[facility] EMERGENCY DEPARTMENT Provider Note   CSN: [id number] Arrival date & time: 10/29/22  1326     History  Chief Complaint  Patient presents with   Neck Pain    [name] MOFFA Sr. is a 61 y.o. male.  61 year old male presents today for evaluation of left-sided neck pain/shoulder pain from SNF.  He states this has been going on for the past couple days.  Patient was also noted to have some jaundice of his conjunctiva over the past couple days.  Has not received a workup at SNF for this yet.  He does endorse right upper quadrant abdominal pain which he states has been going on for multiple weeks as well as nausea without vomiting.  Denies fever, chills, dysuria, hematuria, flank pain.  The history is provided by the patient. No language interpreter was used.       Home Medications Prior to Admission medications   Medication Sig Start Date End Date Taking? Authorizing Provider  acetaminophen (TYLENOL) 325 MG tablet Take 1-2 tablets (325-650 mg total) by mouth every 6 (six) hours as needed for mild pain (pain score 1-3 or temp > 100.5). 07/08/22   Eugenie Filler, MD  Amino Acids-Protein Hydrolys (FEEDING SUPPLEMENT, PRO-STAT SUGAR FREE 64,) LIQD Take 30 mLs by mouth 2 (two) times daily.    [provider]  Amino Acids-Protein Hydrolys (PRO-STAT AWC) LIQD Take 30 mLs by mouth 2 (two) times daily.    [provider]  amLODipine (NORVASC) 5 MG tablet Take 5 mg by mouth daily. 07/15/22   [provider]  aspirin EC 81 MG EC tablet Take 1 tablet (81 mg total) by mouth daily. Swallow whole. 02/21/22   Raiford Noble Latif, DO  benzonatate (TESSALON) 200 MG capsule Take 200 mg by mouth See admin instructions. Take one capsule by mouth two times a day every Tue, Thu, Sat per [facility]    [provider]  ethyl chloride spray Apply 1 Application topically Every Tuesday,Thursday,and Saturday with dialysis.    [provider]   famotidine (PEPCID) 20 MG tablet Take 20 mg by mouth at bedtime.    [provider]  guaiFENesin (ROBITUSSIN) 100 MG/5ML liquid Take 10 mLs by mouth every 4 (four) hours as needed for cough.    [provider]  hydrOXYzine (ATARAX) 25 MG tablet Take 25 mg by mouth See admin instructions. Take one tablet by mouth on Tues, Thur, and Saturdays    [provider]  insulin glargine (LANTUS) 100 UNIT/ML injection Inject 0.32 mLs (32 Units total) into the skin at bedtime. 07/08/22   Eugenie Filler, MD  ipratropium-albuterol (DUONEB) 0.5-2.5 (3) MG/3ML SOLN Take 3 mLs by nebulization every 4 (four) hours as needed (for wheezing or shortness of breath).    [provider]  loperamide (IMODIUM) 2 MG capsule Take 1 capsule (2 mg total) by mouth as needed for diarrhea or loose stools. 07/08/22   Eugenie Filler, MD  Multiple Vitamin (MULTIVITAMIN WITH MINERALS) TABS tablet Take 1 tablet by mouth daily.    [provider]  Nutritional Supplements (FEEDING SUPPLEMENT, NEPRO CARB STEADY,) LIQD Take 237 mLs by mouth 2 (two) times daily between meals. 10/20/22 11/19/22  Arrien, Jimmy Picket, MD  ondansetron (ZOFRAN) 4 MG tablet Take 4 mg by mouth every 6 (six) hours as needed for nausea or vomiting. 06/30/22   [provider]  oxyCODONE-acetaminophen (PERCOCET) 5-325 MG tablet Take 1 tablet by mouth every 6 (six) hours  as needed for severe pain. 10/20/22 10/20/23  Arrien, Jimmy Picket, MD  pantoprazole (PROTONIX) 40 MG tablet Take 1 tablet (40 mg total) by mouth daily at 12 noon. Patient taking differently: Take 40 mg by mouth daily before breakfast. 02/21/22   Raiford Noble Latif, DO  senna-docusate (SENOKOT-S) 8.6-50 MG tablet Take 1 tablet by mouth at bedtime as needed for mild constipation. Patient taking differently: Take 2 tablets by mouth at bedtime. 07/08/22   Eugenie Filler, MD  sevelamer carbonate (RENVELA) 800 MG tablet Take 2,400 mg by mouth 3  (three) times daily. Take on Tues, Thurs, and Saturdays only per [facility]    [provider]  SIMETHICONE PO Take 2 tablets by mouth every 8 (eight) hours as needed (for gas).    [provider]      Allergies    Sulfa antibiotics    Review of Systems   Review of Systems  Respiratory:  Negative for shortness of breath.   Cardiovascular:  Negative for chest pain.  Gastrointestinal:  Positive for abdominal pain and nausea. Negative for vomiting.  Genitourinary:  Negative for dysuria and flank pain.  Neurological:  Negative for light-headedness.  All other systems reviewed and are negative.   Physical Exam Updated Vital Signs BP 127/83   Pulse 83   Temp 98.3 F (36.8 C)   Resp 18   SpO2 98%  Physical Exam Vitals and nursing note reviewed.  Constitutional:      General: He is not in acute distress.    Appearance: Normal appearance. He is not ill-appearing.  HENT:     Head: Normocephalic and atraumatic.     Nose: Nose normal.  Eyes:     General: No scleral icterus.    Extraocular Movements: Extraocular movements intact.     Conjunctiva/sclera: Conjunctivae normal.  Cardiovascular:     Rate and Rhythm: Normal rate and regular rhythm.     Pulses: Normal pulses.  Pulmonary:     Effort: Pulmonary effort is normal. No respiratory distress.     Breath sounds: Normal breath sounds. No wheezing or rales.  Abdominal:     General: There is no distension.     Palpations: Abdomen is soft.     Tenderness: There is abdominal tenderness. There is guarding. There is no right CVA tenderness or left CVA tenderness.  Musculoskeletal:        General: Normal range of motion.     Cervical back: Normal range of motion.     Comments: Right below-knee amputation  Skin:    General: Skin is warm and dry.  Neurological:     General: No focal deficit present.     Mental Status: He is alert. Mental status is at baseline.     ED Results / Procedures / Treatments   Labs (all labs  ordered are listed, but only abnormal results are displayed) Labs Reviewed  CBC WITH DIFFERENTIAL/PLATELET - Abnormal; Notable for the following components:      Result Value   RBC 3.69 (*)    Hemoglobin 10.5 (*)    HCT 32.2 (*)    RDW 22.0 (*)    Monocytes Absolute 1.9 (*)    All other components within normal limits  COMPREHENSIVE METABOLIC PANEL - Abnormal; Notable for the following components:   Potassium 5.3 (*)    Chloride 96 (*)    Glucose, Bld 55 (*)    BUN 34 (*)    Creatinine, Ser 8.06 (*)    Total Protein  8.5 (*)    Albumin 2.4 (*)    AST 54 (*)    Alkaline Phosphatase 276 (*)    Total Bilirubin 6.5 (*)    GFR, Estimated 7 (*)    All other components within normal limits  PROTIME-INR - Abnormal; Notable for the following components:   Prothrombin Time 16.7 (*)    INR 1.4 (*)    All other components within normal limits  LACTATE DEHYDROGENASE - Abnormal; Notable for the following components:   LDH 274 (*)    All other components within normal limits  AMMONIA - Abnormal; Notable for the following components:   Ammonia 78 (*)    All other components within normal limits  CBG MONITORING, ED - Abnormal; Notable for the following components:   Glucose-Capillary 51 (*)    All other components within normal limits  CBG MONITORING, ED - Abnormal; Notable for the following components:   Glucose-Capillary 101 (*)    All other components within normal limits  APTT  ACETAMINOPHEN LEVEL  HEPATITIS PANEL, ACUTE    EKG None  Radiology DG Shoulder Left  Result Date: 10/29/2022 CLINICAL DATA:  Left shoulder pain no injury EXAM: LEFT SHOULDER - 2+ VIEW COMPARISON:  Left shoulder 02/06/2008 FINDINGS: There is no evidence of fracture or dislocation. There is no evidence of arthropathy or other focal bone abnormality. Vascular stent overlying the proximal humerus. IMPRESSION: Negative. Electronically Signed   By: Franchot Gallo M.D.   On: 10/29/2022 16:07   US Abdomen  Limited RUQ (LIVER/GB)  Result Date: 10/29/2022 CLINICAL DATA:  Right upper quadrant pain for 1 week. EXAM: ULTRASOUND ABDOMEN LIMITED RIGHT UPPER QUADRANT COMPARISON:  None Available. FINDINGS: Gallbladder: Shadowing echogenic stones measure up to 1.5 cm. Associated wall thickening, 8 mm. Negative sonographic Murphy sign. No pericholecystic fluid. Common bile duct: Diameter: Approximately 3 mm, within normal limits. No intrahepatic biliary ductal dilatation. Liver: Parenchymal echogenicity is within normal limits. No definite focal lesion. Portal vein is patent on color Doppler imaging with normal direction of blood flow towards the liver. Other: None. IMPRESSION: Cholelithiasis. Associated wall thickening without a positive sonographic Percell Miller sign can be seen in the setting of chronic cholecystitis. Electronically Signed   By: Lorin Picket M.D.   On: 10/29/2022 15:26    Procedures Procedures    Medications Ordered in ED Medications  methocarbamol (ROBAXIN) tablet 500 mg (500 mg Oral Given 10/29/22 1435)    ED Course/ Medical Decision Making/ A&P Clinical Course as of 10/29/22 1747  Wed Oct 29, 2022  1729 Discussed with Dr. Dema Severin of general surgery given concern for choledocholithiasis.  He states he will evaluate patient but recommends gastroenterology consult.  Discussed with Dr. Tarri Glenn of gastroenterology who recommends MRCP and admission to medicine service.  She will plan to see patient tomorrow morning.  Given no convincing signs of cholecystitis on right upper quadrant ultrasound, he is afebrile, and without leukocytosis we will hold off on antibiotics at this point.  Will discuss with hospitalist. [AA]    Clinical Course User Index [AA] Evlyn Courier, PA-C                           Medical Decision Making Amount and/or Complexity of Data Reviewed Labs: ordered. Radiology: ordered.  Risk Prescription drug management. Decision regarding hospitalization.   Medical Decision  Making / ED Course   This patient presents to the ED for concern of neck pain, jaundice, this involves an extensive  number of treatment options, and is a complaint that carries with it a high risk of complications and morbidity.  The differential diagnosis includes muscle strain, muscle spasm, cervical radiculopathy, fracture, choledocholithiasis, cholangitis, pancreatic cancer, cholecystitis, colon cancer  MDM: 61 year old male with past medical history of diabetes, below the knee potation of the right, ESRD on dialysis on Tuesdays, Thursday, Saturday schedule, hypertension, CAD, cardiomyopathy who presents today for evaluation of above-mentioned complaints.  Does have right upper quadrant tenderness with some guarding.  Mild jaundice of the conjunctiva noted.  Ordered workup including CBC, CMP, coags, LDH, ammonia, right upper quadrant ultrasound. CBC is without leukocytosis.  Hemoglobin at patient's baseline at 10.5.  CMP does show potassium of 5.3, creatinine of 8.06, elevated alk phos at 276, elevated T. bili at 6.5, without significant elevations in AST or ALT.  LDH elevated 2 04/18/1973.  INR 1.4.  He is not on anticoagulation.  Ammonia level of 78.  He was given Robaxin with improvement of his left shoulder pain.  Right upper quadrant ultrasound showed cholelithiasis and gallbladder wall thickening but no other acute findings.  Left shoulder x-ray is unremarkable.  After discussing case with both general surgery and gastroenterology MRCP was ordered at the request of gastroenterology.  They will evaluate patient tomorrow.  Will discuss with admitting team.  Discussed with Dr. Flossie Buffy of the hospitalist team who will evaluate patient for admission.   Additional history obtained: -Additional history obtained from daughter over the phone.  She was also updated of patient's plan. -External records from outside source obtained and reviewed including: Chart review including previous notes, labs, imaging,  consultation notes   Lab Tests: -I ordered, reviewed, and interpreted labs.   The pertinent results include:   Labs Reviewed  CBC WITH DIFFERENTIAL/PLATELET - Abnormal; Notable for the following components:      Result Value   RBC 3.69 (*)    Hemoglobin 10.5 (*)    HCT 32.2 (*)    RDW 22.0 (*)    Monocytes Absolute 1.9 (*)    All other components within normal limits  COMPREHENSIVE METABOLIC PANEL - Abnormal; Notable for the following components:   Potassium 5.3 (*)    Chloride 96 (*)    Glucose, Bld 55 (*)    BUN 34 (*)    Creatinine, Ser 8.06 (*)    Total Protein 8.5 (*)    Albumin 2.4 (*)    AST 54 (*)    Alkaline Phosphatase 276 (*)    Total Bilirubin 6.5 (*)    GFR, Estimated 7 (*)    All other components within normal limits  PROTIME-INR - Abnormal; Notable for the following components:   Prothrombin Time 16.7 (*)    INR 1.4 (*)    All other components within normal limits  LACTATE DEHYDROGENASE - Abnormal; Notable for the following components:   LDH 274 (*)    All other components within normal limits  AMMONIA - Abnormal; Notable for the following components:   Ammonia 78 (*)    All other components within normal limits  CBG MONITORING, ED - Abnormal; Notable for the following components:   Glucose-Capillary 51 (*)    All other components within normal limits  CBG MONITORING, ED - Abnormal; Notable for the following components:   Glucose-Capillary 101 (*)    All other components within normal limits  APTT  ACETAMINOPHEN LEVEL  HEPATITIS PANEL, ACUTE      EKG  EKG Interpretation  Date/Time:    Ventricular Rate:  PR Interval:    QRS Duration:   QT Interval:    QTC Calculation:   R Axis:     Text Interpretation:           Imaging Studies ordered: I ordered imaging studies including RUQ [place] I independently visualized and interpreted imaging. I agree with the radiologist interpretation   Medicines ordered and prescription drug  management: Meds ordered this encounter  Medications   methocarbamol (ROBAXIN) tablet 500 mg    -I have reviewed the patients home medicines and have made adjustments as needed  Consultations Obtained: I requested consultation with the general surgery, and gastroenterology,  and discussed lab and imaging findings as well as pertinent plan - they recommend: as above    Reevaluation: After the interventions noted above, I reevaluated the patient and found that they have :improved  Co morbidities that complicate the patient evaluation  Past Medical History:  Diagnosis Date   CHF (congestive heart failure) ([place])    ECHO 02/05/22  EF 45-50%, DD indeterminent   Diabetes mellitus without complication ([place])    type 2   ESRD on hemodialysis ([place])    mon wed fri   Hypertension    Kidney failure    Myocardial infarction ([place])    Nonischemic cardiomyopathy ([place])       Dispostion: Patient discussed with hospitalist who will evaluate patient for admission.   Final Clinical Impression(s) / ED Diagnoses Final diagnoses:  RUQ abdominal pain    Rx / DC Orders ED Discharge Orders     None         Evlyn Courier, PA-C 10/29/22 1840    Evlyn Courier, PA-C 10/29/22 1840    Carmin Muskrat, MD 10/31/22 1622

## 2022-10-29 NOTE — Assessment & Plan Note (Signed)
Stable. Continue home meds pending med rec. 

## 2022-10-29 NOTE — ED Triage Notes (Addendum)
Pt BIB EMS due to neck pain. Pt is jaundice, has right BKA. Pt is from [facility]. Pt is in end stage CKD. NP would like liver enzymes checked. Pt on 1528ml fluid restriction. Pt had 5mg  of oxy at 0800. Pt is axox4. PT has dialysis Tuesday, Thursday, Saturday. Pt had full tx yesterday.

## 2022-10-29 NOTE — ED Notes (Signed)
Patient provided with some orange juice to drink at this time.

## 2022-10-30 ENCOUNTER — Inpatient Hospital Stay (HOSPITAL_COMMUNITY): Payer: Medicare Other

## 2022-10-30 DIAGNOSIS — R1011 Right upper quadrant pain: Secondary | ICD-10-CM | POA: Diagnosis not present

## 2022-10-30 DIAGNOSIS — R4182 Altered mental status, unspecified: Secondary | ICD-10-CM

## 2022-10-30 LAB — COMPREHENSIVE METABOLIC PANEL
ALT: 34 U/L (ref 0–44)
AST: 43 U/L — ABNORMAL HIGH (ref 15–41)
Albumin: 2.4 g/dL — ABNORMAL LOW (ref 3.5–5.0)
Alkaline Phosphatase: 287 U/L — ABNORMAL HIGH (ref 38–126)
Anion gap: 14 (ref 5–15)
BUN: 38 mg/dL — ABNORMAL HIGH (ref 6–20)
CO2: 30 mmol/L (ref 22–32)
Calcium: 9.5 mg/dL (ref 8.9–10.3)
Chloride: 93 mmol/L — ABNORMAL LOW (ref 98–111)
Creatinine, Ser: 9.28 mg/dL — ABNORMAL HIGH (ref 0.61–1.24)
GFR, Estimated: 6 mL/min — ABNORMAL LOW (ref >60)
Glucose, Bld: 126 mg/dL — ABNORMAL HIGH (ref 70–99)
Potassium: 5.2 mmol/L — ABNORMAL HIGH (ref 3.5–5.1)
Sodium: 137 mmol/L (ref 135–145)
Total Bilirubin: 7.3 mg/dL — ABNORMAL HIGH (ref 0.3–1.2)
Total Protein: 8.7 g/dL — ABNORMAL HIGH (ref 6.5–8.1)

## 2022-10-30 LAB — FERRITIN: Ferritin: 1410 ng/mL — ABNORMAL HIGH (ref 24–336)

## 2022-10-30 LAB — ACETAMINOPHEN LEVEL: Acetaminophen (Tylenol), Serum: <10 ug/mL — ABNORMAL LOW (ref 10–30)

## 2022-10-30 MED ORDER — AMLODIPINE BESYLATE 5 MG PO TABS
5.0000 mg | ORAL_TABLET | Freq: Every day | ORAL | Status: DC
  Administered 2022-10-31 – 2022-11-07 (×5): 5 mg via ORAL
  Filled 2022-10-30 (×7): qty 1

## 2022-10-30 MED ORDER — TECHNETIUM TC 99M MEBROFENIN IV KIT
5.4000 | PACK | Freq: Once | INTRAVENOUS | Status: AC | PRN
  Administered 2022-10-30: 5.4 via INTRAVENOUS

## 2022-10-30 MED ORDER — SEVELAMER CARBONATE 800 MG PO TABS
2400.0000 mg | ORAL_TABLET | Freq: Three times a day (TID) | ORAL | Status: DC
  Administered 2022-10-30: 2400 mg via ORAL
  Administered 2022-10-31 (×2): 1600 mg via ORAL
  Administered 2022-11-01 – 2022-11-04 (×8): 2400 mg via ORAL
  Administered 2022-11-05 – 2022-11-06 (×4): 800 mg via ORAL
  Filled 2022-10-30 (×19): qty 3

## 2022-10-30 MED ORDER — CHLORHEXIDINE GLUCONATE CLOTH 2 % EX PADS
6.0000 | MEDICATED_PAD | Freq: Every day | CUTANEOUS | Status: DC
  Administered 2022-10-31 – 2022-11-08 (×8): 6 via TOPICAL

## 2022-10-30 MED ORDER — ACETAMINOPHEN 325 MG PO TABS
650.0000 mg | ORAL_TABLET | Freq: Four times a day (QID) | ORAL | Status: DC | PRN
  Administered 2022-10-31 – 2022-11-08 (×6): 650 mg via ORAL
  Filled 2022-10-30 (×6): qty 2

## 2022-10-30 MED ORDER — MORPHINE SULFATE (PF) 2 MG/ML IV SOLN
1.0000 mg | INTRAVENOUS | Status: DC | PRN
  Administered 2022-11-02 – 2022-11-03 (×3): 1 mg via INTRAVENOUS
  Filled 2022-10-30 (×5): qty 1

## 2022-10-30 MED ORDER — DIPHENHYDRAMINE HCL 25 MG PO CAPS
25.0000 mg | ORAL_CAPSULE | Freq: Three times a day (TID) | ORAL | Status: DC | PRN
  Administered 2022-10-31 – 2022-11-08 (×8): 25 mg via ORAL
  Filled 2022-10-30 (×7): qty 1

## 2022-10-30 NOTE — Consult Note (Signed)
Consultation  Referring Provider: [facility]/Krishnan Primary Care Physician:  Pcp, No Primary Gastroenterologist: None/unassigned  Reason for Consultation: Abdominal pain x 3 weeks  HPI: [name] BRAMER Sr. is a 61 y.o. male, nursing home resident who was brought to the emergency room yesterday with complaint of progressive abdominal pain.  He relates the pain has been present over the past 2 to 3 weeks, has been intermittent sharp in nature, not associated with p.o. intake.  When asked where his pain is located he points more towards his left lower quadrant.  However he says he has been having nausea and vomiting intermittently as well.  Appetite okay not aware of any fever or chills, no dysuria, no changes in bowel habits melena or hematochezia. Patient has history of prior CVA, end-stage renal disease on hemodialysis, congestive heart failure with EF 40 to 45%, prior MI, peripheral vascular disease status post right BKA and hypertension.  Evaluation in the ER-with abdominal ultrasound shows a normal appearance of the liver, small pericardial effusion, slight ill definition of ascending thoracic aorta , otherwise normal in caliber, if concern for dissection consider contrasted CT, minimal free fluid adjacent to the spleen.  Cholelithiasis and associated wall thickening at 8 mm, normal CBD at 3 mm  Labs revealed elevated LFTs with T. bili of 6.5  MRI/MRCP last p.m. revealed small left effusion, changes consistent with severe iron deposition, no ductal dilation, CBD 4 mm, gallstones are present, gallbladder is largely decompressed, gallbladder wall appears mildly thickened and edematous, no substantial volume of pericholecystic fluid  Labs yesterday pro time 16.7/INR 1.4 WBC 8.8/hemoglobin 10.5/hematocrit 32.2 Potassium 5.3/BUN 34/creatinine 8.06 T. bili 6.5/alk phos 276/AST 54/ALT 36 Venous ammonia 78 hepatitis C antibody negative B surface antigen and surface antibody negative Today's labs T.  bili 7.3/alk phos 287/AST 43/ALT 34   Patient says his pain is not present currently and he feels like he can eat something however on exam he is definitely tender in the right upper quadrant and in the left lower quadrant      Past Medical History:  Diagnosis Date   CHF (congestive heart failure) ([place])    ECHO 02/05/22  EF 45-50%, DD indeterminent   Diabetes mellitus without complication ([place])    type 2   ESRD on hemodialysis ([place])    mon wed fri   Hypertension    Kidney failure    Myocardial infarction ([place])    Nonischemic cardiomyopathy ([place])     Past Surgical History:  Procedure Laterality Date   A/V FISTULAGRAM N/A 08/15/2019   Procedure: A/V FISTULAGRAM;  Surgeon: [name], MD;  Location: [facility];  Service: Cardiovascular;  Laterality: N/A;   AMPUTATION Right 02/02/2022   Procedure: AMPUTATION 5th RAY;  Surgeon: [name], MD;  Location: [place] ;  Service: Podiatry;  Laterality: Right;   AMPUTATION Right 05/23/2022   Procedure: RIGHT BELOW KNEE AMPUTATION;  Surgeon: [name], MD;  Location: [place];  Service: Orthopedics;  Laterality: Right;   AMPUTATION Right 07/04/2022   Procedure: REVISION AMPUTATION BELOW KNEE;  Surgeon: [name], MD;  Location: [place];  Service: Orthopedics;  Laterality: Right;   APPLICATION OF WOUND VAC  05/23/2022   Procedure: APPLICATION OF WOUND VAC;  Surgeon: [name], MD;  Location: [place];  Service: Orthopedics;;   AV FISTULA PLACEMENT     CARDIAC CATHETERIZATION  02/17/2018   LHC 02/17/18 ([name]): 30% pLAD, 30% dRCA, LVEDP 25-30, LVEF 25%.  CATARACT EXTRACTION W/PHACO Right 03/29/2020   Procedure: CATARACT EXTRACTION PHACO AND INTRAOCULAR LENS PLACEMENT ([place])  RIGHT DIABETIC;  Surgeon: [name], MD;  Location: [facility];  Service: Ophthalmology;  Laterality: Right;  Lot # [id number] H [place]: 02:30.9 AP% 9.5% CDE: 14.38   EYE SURGERY Right    cataract removed   I & D EXTREMITY Right  02/02/2022   Procedure: IRRIGATION AND DEBRIDEMENT RIGHT FOOT;  Surgeon: [name], MD;  Location: [place];  Service: Podiatry;  Laterality: Right;   INCISION AND DRAINAGE OF WOUND Right 02/08/2022   Procedure: IRRIGATION AND DEBRIDEMENT WOUND;  Surgeon: [name], DPM;  Location: [place];  Service: Podiatry;  Laterality: Right;  with removal of infected bone    INSERTION OF DIALYSIS CATHETER  08/13/2019   Procedure: Insertion Of Dialysis Catheter;  Surgeon: [name], MD;  Location: [place];  Service: Vascular;;   INSERTION OF DIALYSIS CATHETER Left 07/19/2020   Procedure: INSERTION OF DIALYSIS CATHETER;  Surgeon: [name], MD;  Location: [place];  Service: Vascular;  Laterality: Left;   IRRIGATION AND DEBRIDEMENT ABSCESS     PERIPHERAL VASCULAR BALLOON ANGIOPLASTY  08/15/2019   Procedure: PERIPHERAL VASCULAR BALLOON ANGIOPLASTY;  Surgeon: [name], MD;  Location: [place] [facility];  Service: Cardiovascular;;   PERIPHERAL VASCULAR BALLOON ANGIOPLASTY Left 02/06/2022   Procedure: PERIPHERAL VASCULAR BALLOON ANGIOPLASTY;  Surgeon: [name], MD;  Location: [place];  Service: Cardiovascular;  Laterality: Left;   REVISON OF ARTERIOVENOUS FISTULA Left 07/19/2020   Procedure: REVISON/PLICATION OF ARTERIOVENOUS FISTULA LEFT;  Surgeon: [name], MD;  Location: [place];  Service: Vascular;  Laterality: Left;   THROMBECTOMY AND REVISION OF ARTERIOVENTOUS (AV) GORETEX  GRAFT Left 08/13/2019   Procedure: THROMBECTOMY OF LEFT ARM ARTERIOVENTOUS (AV) FISTULA;  Surgeon: [name], MD;  Location: [facility];  Service: Vascular;  Laterality: Left;   TOE AMPUTATION      Prior to Admission medications   Medication Sig Start Date End Date Taking? Authorizing Provider  acetaminophen (TYLENOL) 325 MG tablet Take 1-2 tablets (325-650 mg total) by mouth every 6 (six) hours as needed for mild pain (pain score 1-3 or temp > 100.5). 07/08/22   Eugenie Filler, MD  Amino Acids-Protein Hydrolys (FEEDING SUPPLEMENT, PRO-STAT SUGAR FREE 64,) LIQD Take 30 mLs by mouth 2 (two) times daily.    [provider]  Amino Acids-Protein Hydrolys (PRO-STAT AWC) LIQD Take 30 mLs by mouth 2 (two) times daily.    [provider]  amLODipine (NORVASC) 5 MG tablet Take 5 mg by mouth daily. 07/15/22   [provider]  aspirin EC 81 MG EC tablet Take 1 tablet (81 mg total) by mouth daily. Swallow whole. 02/21/22   Raiford Noble Latif, DO  benzonatate (TESSALON) 200 MG capsule Take 200 mg by mouth See admin instructions. Take one capsule by mouth two times a day every Tue, Thu, Sat per [facility].    [provider]  ethyl chloride spray Apply 1 Application topically Every Tuesday,Thursday,and Saturday with dialysis.    [provider]  famotidine (PEPCID) 20 MG tablet Take 20 mg by mouth at bedtime.    [provider]  guaiFENesin (ROBITUSSIN) 100 MG/5ML liquid Take 10 mLs by mouth every 4 (four) hours as needed for cough.    [provider]  hydrOXYzine (ATARAX) 25 MG tablet Take 25 mg by mouth See admin instructions. Take one tablet by mouth on Tues, Thur, and Saturdays  [provider]  insulin glargine (LANTUS) 100 UNIT/ML injection Inject 0.32 mLs (32 Units total) into the skin at bedtime. 07/08/22   Eugenie Filler, MD  ipratropium-albuterol (DUONEB) 0.5-2.5 (3) MG/3ML SOLN Take 3 mLs by nebulization every 4 (four) hours as needed (for wheezing or shortness of breath).    [provider]  loperamide (IMODIUM) 2 MG capsule Take 1 capsule (2 mg total) by mouth as needed for diarrhea or loose stools. 07/08/22   Eugenie Filler, MD  Multiple Vitamin (MULTIVITAMIN WITH MINERALS) TABS tablet Take 1 tablet by mouth daily.    [provider]  Nutritional Supplements (FEEDING SUPPLEMENT, NEPRO CARB STEADY,) LIQD Take 237 mLs by mouth 2 (two) times daily between meals. 10/20/22 11/19/22   Arrien, Jimmy Picket, MD  ondansetron (ZOFRAN) 4 MG tablet Take 4 mg by mouth every 6 (six) hours as needed for nausea or vomiting. 06/30/22   [provider]  oxyCODONE-acetaminophen (PERCOCET) 5-325 MG tablet Take 1 tablet by mouth every 6 (six) hours as needed for severe pain. 10/20/22 10/20/23  Arrien, Jimmy Picket, MD  pantoprazole (PROTONIX) 40 MG tablet Take 1 tablet (40 mg total) by mouth daily at 12 noon. Patient taking differently: Take 40 mg by mouth daily before breakfast. 02/21/22   Raiford Noble Latif, DO  senna-docusate (SENOKOT-S) 8.6-50 MG tablet Take 1 tablet by mouth at bedtime as needed for mild constipation. Patient taking differently: Take 2 tablets by mouth at bedtime. 07/08/22   Eugenie Filler, MD  sevelamer carbonate (RENVELA) 800 MG tablet Take 2,400 mg by mouth 3 (three) times daily. Take on Tues, Thurs, and Saturdays only per [facility]    [provider]  SIMETHICONE PO Take 2 tablets by mouth every 8 (eight) hours as needed (for gas).    [provider]    Current Facility-Administered Medications  Medication Dose Route Frequency Provider Last Rate Last Admin   amLODipine (NORVASC) tablet 5 mg  5 mg Oral Daily Tu, Ching T, DO       aspirin chewable tablet 81 mg  81 mg Oral Daily Tu, Ching T, DO       sevelamer carbonate (RENVELA) tablet 2,400 mg  2,400 mg Oral TID WC Tu, Ching T, DO        Allergies as of 10/29/2022 - Review Complete 10/29/2022  Allergen Reaction Noted   Sulfa antibiotics Nausea And Vomiting and Other (See Comments) 12/10/2019    Family History  Problem Relation Age of Onset   Hypertension Mother     Social History   Socioeconomic History   Marital status: Divorced    Spouse name: Not on file   Number of children: Not on file   Years of education: Not on file   Highest education level: Not on file  Occupational History   Not on file  Tobacco Use   Smoking status: Never   Smokeless tobacco: Never  Vaping  Use   Vaping Use: Never used  Substance and Sexual Activity   Alcohol use: Not Currently   Drug use: Never   Sexual activity: Not on file  Other Topics Concern   Not on file  Social History Narrative   Not on file   Social Determinants of Health   Financial Resource Strain: Not on file  Food Insecurity: No Food Insecurity (10/29/2022)   Hunger Vital Sign    Worried About Running Out of Food in the Last Year: Never true    Ran Out of Food in  the Last Year: Never true  Transportation Needs: No Transportation Needs (10/29/2022)   PRAPARE - Transportation    Lack of Transportation (Medical): No    Lack of Transportation (Non-Medical): No  Physical Activity: Not on file  Stress: Not on file  Social Connections: Not on file  Intimate Partner Violence: Not At Risk (10/29/2022)   Humiliation, Afraid, Rape, and Kick questionnaire    Fear of Current or Ex-Partner: No    Emotionally Abused: No    Physically Abused: No    Sexually Abused: No    Review of Systems: Pertinent positive and negative review of systems were noted in the above HPI section.  All other review of systems was otherwise negative.   Physical Exam: Vital signs in last 24 hours: Temp:  [98.1 F (36.7 C)-98.7 F (37.1 C)] 98.7 F (37.1 C) (12/13 2138) Pulse Rate:  [80-86] 80 (12/14 0719) Resp:  [15-23] 15 (12/14 0719) BP: (93-127)/(70-83) 117/74 (12/14 0719) SpO2:  [93 %-100 %] 100 % (12/14 0719) Last BM Date : 10/29/22 General:   Alert,  Well-developed, well-nourished, older African-American male pleasant and cooperative in NAD Head:  Normocephalic and atraumatic. Eyes:  Sclera icteric, conjunctiva pink. Ears:  Normal auditory acuity. Nose:  No deformity, discharge,  or lesions. Mouth:  No deformity or lesions.   Neck:  Supple; no masses or thyromegaly. Lungs:  Clear throughout to auscultation.   No wheezes, crackles, or rhonchi. Heart:  Regular rate and rhythm; no murmurs, clicks, rubs,  or  gallops. Abdomen:  Soft, obese, nondistended, bowel sounds are present, he is tender in the right upper quadrant, liver palpable at the right costal margin, he is also tender in the left lower quadrant, no guarding or rebound no palpable mass Rectal:  not done Msk:  Symmetrical without gross deformities. . Pulses:  Normal pulses noted. Extremities: Status post right BKA Neurologic:  Alert and  oriented x4; grossly normal Skin:  Intact without significant lesions or rashes.. Psych:  Alert and cooperative. Normal mood and affect.  Intake/Output from previous day: No intake/output data recorded. Intake/Output this shift: No intake/output data recorded.  Lab Results: Recent Labs    10/29/22 1438  WBC 8.8  HGB 10.5*  HCT 32.2*  PLT 349   BMET Recent Labs    10/29/22 1438 10/30/22 0728  NA 138 137  K 5.3* 5.2*  CL 96* 93*  CO2 28 30  GLUCOSE 55* 126*  BUN 34* 38*  CREATININE 8.06* 9.28*  CALCIUM 9.4 9.5   LFT Recent Labs    10/29/22 1935 10/30/22 0728  PROT  --  8.7*  ALBUMIN  --  2.4*  AST  --  43*  ALT  --  34  ALKPHOS  --  287*  BILITOT 6.6* 7.3*  BILIDIR 4.0*  --   IBILI 2.6*  --    PT/INR Recent Labs    10/29/22 1438  LABPROT 16.7*  INR 1.4*   Hepatitis Panel Recent Labs    10/29/22 1438  HEPBSAG NON REACTIVE  HCVAB NON REACTIVE  HEPAIGM NON REACTIVE  HEPBIGM NON REACTIVE      IMPRESSION:  #1 61-year-old African-American male, nursing home resident with prior CVA, status post right BKA, peripheral vascular disease, and end-stage renal disease on hemodialysis presenting with 3-week history of intermittent abdominal pain, nausea vomiting  Noted to have elevated LFTs on arrival, with T. bili 6.5/alk phos 276/AST 54. No prior history of known liver disease. Limited ultrasound yesterday with normal-appearing liver, gallstones and   thickened gallbladder wall at 8 mm, CBD 3 mm  MRCP last p.m. shows changes in the liver consistent with severe iron  deposition, no ductal dilation, CBD 4 mm no choledocholithiasis, he does have gallstones and gallbladder is mildly thickened and edematous  On exam he is definitely tender in the upper abdomen right upper quadrant and also in the left lower quadrant. He has been afebrile and WBC normal on admission.  Concerned that he does have findings consistent with acute cholecystitis  Etiology of the left lower quadrant pain is not clear     PLAN: HIDA scan Okay for full liquid diet post HIDA scan Trend LFTs, repeat CBC With significant iron deposition in the liver noted on MRCP, consider hemochromatosis-check ferritin  GI will follow with you   Amy Esterwood PA-C 10/30/2022, 9:57 AM    

## 2022-10-30 NOTE — Procedures (Signed)
Routine EEG Report  [name] DOYON Sr. is a 61 y.o. male with a history of altered mental status with visual auras who is undergoing an EEG to evaluate for seizures.  Report: This EEG was acquired with electrodes placed according to the International 10-20 electrode system (including Fp1, Fp2, F3, F4, C3, C4, P3, P4, O1, O2, T3, T4, T5, T6, A1, A2, Fz, Cz, Pz). The following electrodes were missing or displaced: none.  The occipital dominant rhythm was 8.5 Hz. This activity is reactive to stimulation. Drowsiness was manifested by background fragmentation; deeper stages of sleep were identified by K complexes and sleep spindles. There was no focal slowing. There were no interictal epileptiform discharges. There were no electrographic seizures identified. Photic stimulation and hyperventilation were not performed.  Impression: This EEG was obtained while awake and asleep and is normal.    Clinical Correlation: Normal EEGs, however, do not rule out epilepsy.  [name], MD [facility] [phone]  If 7pm- 7am, please page neurology on call as listed in [place].

## 2022-10-30 NOTE — Consult Note (Signed)
Renal Service Consult Note [facility]  [name] [name] Sr. 10/30/2022 Sol Blazing, MD Requesting Physician: Dr. Maryland Pink, Darnell Level   Reason for Consult: ESRD pt w/ jaundice/ ^LFT's HPI: The patient is a 61 y.o. year-old w/ PMH as below who presented yesterday for neck pain. In ED labs showed ^^LFT's and pt was jaundiced. Pt reported abd pain and LUQ pain for last 3 wks. Also +n/v/d. He had dialysis on 12/12 w/o incident. Pt admitted for ^LFT"s. We are asked to see for dialysis.   Pt seen in room. He lives in [place] and takes [facility] transport to HD sessions. Has not missed HD. No access issues. No fevers, +dry cough.    ROS - denies CP, no joint pain, no HA, no blurry vision, no rash, no diarrhea, no nausea/ vomiting, no dysuria, no difficulty voiding   Past Medical History  Past Medical History:  Diagnosis Date   CHF (congestive heart failure) ([place])    ECHO 02/05/22  EF 45-50%, DD indeterminent   Diabetes mellitus without complication ([place])    type 2   ESRD on hemodialysis ([place])    mon wed fri   Hypertension    Kidney failure    Myocardial infarction ([place])    Nonischemic cardiomyopathy [facility])    Past Surgical History  Past Surgical History:  Procedure Laterality Date   A/V FISTULAGRAM N/A 08/15/2019   Procedure: A/V FISTULAGRAM;  Surgeon: [name], MD;  Location: [place] [facility];  Service: Cardiovascular;  Laterality: N/A;   AMPUTATION Right 02/02/2022   Procedure: AMPUTATION 5th RAY;  Surgeon: [name], MD;  Location: [place];  Service: Podiatry;  Laterality: Right;   AMPUTATION Right 05/23/2022   Procedure: RIGHT BELOW KNEE AMPUTATION;  Surgeon: [name], MD;  Location: [place];  Service: Orthopedics;  Laterality: Right;   AMPUTATION Right 07/04/2022   Procedure: REVISION AMPUTATION BELOW KNEE;  Surgeon: [name], MD;  Location: [place];  Service: Orthopedics;  Laterality: Right;   APPLICATION OF WOUND VAC  05/23/2022   Procedure:  APPLICATION OF WOUND VAC;  Surgeon: [name], MD;  Location: [place];  Service: Orthopedics;;   AV FISTULA PLACEMENT     CARDIAC CATHETERIZATION  02/17/2018   LHC 02/17/18 ([name]): 30% pLAD, 30% dRCA, LVEDP 25-30, LVEF 25%.     CATARACT EXTRACTION W/PHACO Right 03/29/2020   Procedure: CATARACT EXTRACTION PHACO AND INTRAOCULAR LENS PLACEMENT ([place])  RIGHT DIABETIC;  Surgeon: [name], MD;  Location: [facility];  Service: Ophthalmology;  Laterality: Right;  Lot # [id number] H [place]: 02:30.9 AP% 9.5% CDE: 14.38   EYE SURGERY Right    cataract removed   I & D EXTREMITY Right 02/02/2022   Procedure: IRRIGATION AND DEBRIDEMENT RIGHT FOOT;  Surgeon: [name], MD;  Location: [place];  Service: Podiatry;  Laterality: Right;   INCISION AND DRAINAGE OF WOUND Right 02/08/2022   Procedure: IRRIGATION AND DEBRIDEMENT WOUND;  Surgeon: [name], DPM;  Location: [place];  Service: Podiatry;  Laterality: Right;  with removal of infected bone    INSERTION OF DIALYSIS CATHETER  08/13/2019   Procedure: Insertion Of Dialysis Catheter;  Surgeon: [name], MD;  Location: [place];  Service: Vascular;;   INSERTION OF DIALYSIS CATHETER Left 07/19/2020   Procedure: INSERTION OF DIALYSIS CATHETER;  Surgeon: [name], MD;  Location: [place];  Service: Vascular;  Laterality: Left;   IRRIGATION AND DEBRIDEMENT ABSCESS     PERIPHERAL VASCULAR BALLOON ANGIOPLASTY  08/15/2019  Procedure: PERIPHERAL VASCULAR BALLOON ANGIOPLASTY;  Surgeon: [name], MD;  Location: [place] [facility];  Service: Cardiovascular;;   PERIPHERAL VASCULAR BALLOON ANGIOPLASTY Left 02/06/2022   Procedure: PERIPHERAL VASCULAR BALLOON ANGIOPLASTY;  Surgeon: [name], MD;  Location: [place] [facility];  Service: Cardiovascular;  Laterality: Left;   REVISON OF ARTERIOVENOUS FISTULA Left 07/19/2020   Procedure: REVISON/PLICATION OF ARTERIOVENOUS FISTULA LEFT;  Surgeon: [name], MD;  Location: [facility];  Service: Vascular;  Laterality: Left;   THROMBECTOMY AND REVISION OF ARTERIOVENTOUS (AV) GORETEX  GRAFT Left 08/13/2019   Procedure: THROMBECTOMY OF LEFT ARM ARTERIOVENTOUS (AV) FISTULA;  Surgeon: [name], MD;  Location: [facility];  Service: Vascular;  Laterality: Left;   TOE AMPUTATION     Family History  Family History  Problem Relation Age of Onset   Hypertension Mother    Social History  reports that he has never smoked. He has never used smokeless tobacco. He reports that he does not currently use alcohol. He reports that he does not use drugs. Allergies  Allergies  Allergen Reactions   Sulfa Antibiotics Nausea And Vomiting and Other (See Comments)    "Allergic," per [facility]   Home medications Prior to Admission medications   Medication Sig Start Date End Date Taking? Authorizing Provider  acetaminophen (TYLENOL) 325 MG tablet Take 1-2 tablets (325-650 mg total) by mouth every 6 (six) hours as needed for mild pain (pain score 1-3 or temp > 100.5). 07/08/22   Eugenie Filler, MD  Amino Acids-Protein Hydrolys (FEEDING SUPPLEMENT, PRO-STAT SUGAR FREE 64,) LIQD Take 30 mLs by mouth 2 (two) times daily.    [provider]  Amino Acids-Protein Hydrolys (PRO-STAT AWC) LIQD Take 30 mLs by mouth 2 (two) times daily.    [provider]  amLODipine (NORVASC) 5 MG tablet Take 5 mg by mouth daily. 07/15/22   [provider]  aspirin EC 81 MG EC tablet Take 1 tablet (81 mg total) by mouth daily. Swallow whole. 02/21/22   Raiford Noble Latif, DO  benzonatate (TESSALON) 200 MG capsule Take 200 mg by mouth See admin instructions. Take one capsule by mouth two times a day every Tue, Thu, Sat per [facility].    [provider]  ethyl chloride spray Apply 1 Application topically Every Tuesday,Thursday,and Saturday with dialysis.    [provider]  famotidine (PEPCID) 20 MG tablet Take 20 mg by mouth at bedtime.    [provider]  guaiFENesin (ROBITUSSIN) 100 MG/5ML liquid Take 10 mLs by mouth every 4 (four) hours as needed for cough.    [provider]  hydrOXYzine (ATARAX) 25 MG tablet Take 25 mg by mouth See admin instructions. Take one tablet by mouth on Tues, Thur, and Saturdays    [provider]  insulin glargine (LANTUS) 100 UNIT/ML injection Inject 0.32 mLs (32 Units total) into the skin at bedtime. 07/08/22   Eugenie Filler, MD  ipratropium-albuterol (DUONEB) 0.5-2.5 (3) MG/3ML SOLN Take 3 mLs by nebulization every 4 (four) hours as needed (for wheezing or shortness of breath).    [provider]  loperamide (IMODIUM) 2 MG capsule Take 1 capsule (2 mg total) by mouth as needed for diarrhea or loose stools. 07/08/22   Eugenie Filler, MD  Multiple Vitamin (MULTIVITAMIN WITH MINERALS) TABS tablet Take 1 tablet by mouth daily.    [provider]  Nutritional Supplements (FEEDING SUPPLEMENT, NEPRO CARB STEADY,) LIQD Take 237 mLs by mouth 2 (  two) times daily between meals. 10/20/22 11/19/22  Arrien, Jimmy Picket, MD  ondansetron (ZOFRAN) 4 MG tablet Take 4 mg by mouth every 6 (six) hours as needed for nausea or vomiting. 06/30/22   [provider]  oxyCODONE-acetaminophen (PERCOCET) 5-325 MG tablet Take 1 tablet by mouth every 6 (six) hours as needed for severe pain. 10/20/22 10/20/23  Arrien, Jimmy Picket, MD  pantoprazole (PROTONIX) 40 MG tablet Take 1 tablet (40 mg total) by mouth daily at 12 noon. Patient taking differently: Take 40 mg by mouth daily before breakfast. 02/21/22   Raiford Noble Latif, DO  senna-docusate (SENOKOT-S) 8.6-50 MG tablet Take 1 tablet by mouth at bedtime as needed for mild constipation. Patient taking differently: Take 2 tablets by mouth at bedtime. 07/08/22   Eugenie Filler, MD  sevelamer carbonate (RENVELA) 800 MG tablet Take 2,400 mg by mouth 3 (three) times daily. Take on Tues, Thurs, and Saturdays only per [facility]    [provider]  SIMETHICONE PO Take 2 tablets by mouth every 8 (eight) hours as needed (for gas).    [provider]     Vitals:   10/29/22 2000 10/29/22 2015 10/29/22 2138 10/30/22 0719  BP: 93/70 115/76 115/72 117/74  Pulse: 83 84 85 80  Resp: 16 18 17 15   Temp:   98.7 F (37.1 C)   TempSrc:      SpO2: 95% 96% 97% 100%   Exam Gen alert, no distress, no O2 No rash, cyanosis or gangrene Sclera anicteric, throat clear  No jvd or bruits Chest clear bilat to bases, no rales/ wheezing RRR no MRG Abd soft ntnd no mass or ascites +bs GU normal male MS no joint effusions or deformity Ext no LE or UE edema, no wounds or ulcers Neuro is alert, Ox 3 , nf, no asterixis    LUA AVF+bruit    Home meds include - amlodipine, renvela 3 ac tid    OP HD: [place] TTS 4h 79min 450/ 500   104kg   2/2 bath  Heparin 6000  LUA AVF - last HD 12/12, post wt 103.3kg - venofer 100 tiw IV thru 12/19 - hectorol 4 ug IV tiw - no esa, last Hb 11.0    ED afeb, normal BP to soft   Tbili 6.5, AST 54/ ALT 36   RUQ [place] +stones, neg murphy's   Assessment/ Plan: Abd pain/ jaundice - gen surgery and GI consulting. Per pmd ESRD - on HD TTS, has not missed HD. Plan HD today/ tonight.  HTN/ vol - euvolemic on exam, no edema. Get wts. BP's low to soft. Min UF w/ HD.  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 Follow.  MBD ckd - CCa in range, add on phos. Cont renvela 3 tid ac.  H/o CVA DM2 Debility - chronic, lives at [facility]  MD 10/30/2022, 10:24 AM Recent Labs  Lab 10/29/22 1438 10/30/22 0728  HGB 10.5*  --   ALBUMIN 2.4* 2.4*  CALCIUM 9.4 9.5  CREATININE 8.06* 9.28*  K 5.3* 5.2*   Inpatient medications:  amLODipine  5 mg Oral Daily   aspirin  81 mg Oral Daily   sevelamer carbonate  2,400 mg Oral TID WC

## 2022-10-30 NOTE — Progress Notes (Signed)
CSW spoke with patient's daughter [name] [name] to discuss her concerns about patient's current facility. CSW listened to [name]'s concerns about the facility and informed her that she would work with the facility discharge planner to pursue alternative placement if desired. [name] stated understanding and agreement. [name] [name] is in agreement for patient to discharge back to [facility] when medically cleared.  [name], MSW, LCSW Transitions of Care  Clinical Social Worker II [phone]

## 2022-10-30 NOTE — Progress Notes (Signed)
EEG complete - results pending 

## 2022-10-30 NOTE — Progress Notes (Signed)
MRCP reviewed.  No evidence of cholecystitis or choledocholithiasis.  LFT findings do not appear to be related to his gallbladder and he has no gallbladder symptoms.  Will defer to primary for further management and possible GI evaluation.  We are available as needed.  Henreitta Cea 7:33 AM 10/30/2022

## 2022-10-30 NOTE — Progress Notes (Signed)
[facility] PROGRESS NOTE   [name] GMW:[id number] DOB: January 31, 1961 DOA: 10/29/2022  PCP: Pcp, No  Brief History/Interval Summary: 61 y.o. male with medical history significant of ESRD on HD TTS, HFrEF, T2DM, PVD s/p right BKA, HTN who presents with neck pain, visual aura, abdominal pain. Patient reports having epigastric and left upper quadrant abdominal pain for the past 3 weeks.  Has had intermittent nausea, vomiting and diarrhea.  Reportedly already had ultrasound outpatient about 3 weeks ago showing gallstones and was getting workup outpatient. Right upper quadrant ultrasound shows cholelithiasis but no positive sonographic Murphy sign.  No wall thickening to suggest cholecystitis.  General surgery has evaluated and recommends GI consult.   Consultants: Gastroenterology.  General surgery  Procedures: EEG will be ordered    Subjective/Interval History: Patient continues to have neck pain.  Flashing lights have resolved.  Denies any history of seizure disorder.  Denies any history of migraine.  Denies any headaches currently.  Continues to have some abdominal discomfort.     Assessment/Plan:  Hyperbilirubinemia with abdominal pain/cholelithiasis MRCP does not suggest choledocholithiasis.  No evidence for cholecystitis.  Gallstones were seen. His alkaline phosphatase is mildly elevated.  Bilirubin is significantly elevated majority of it being direct.  Noted to have mildly elevated bilirubin in July but it was normal back in April. Etiology is unclear. Hepatitis panel unremarkable. Gastroenterology evaluation is pending.  Visual aura/neck pain Patient has never had the symptoms previously.  He had a stroke earlier this year.  No history of seizures and migraine headaches. Will do CT head and cervical spine.  Will do EEG. Patient apparently reported frontal headache to the admitting provider so possibility exists of migraine.  End-stage renal disease/mild  hyperkalemia Dialyzed on Tuesday Thursday Saturday schedule.  Has not missed hemodialysis. Will notify nephrology.  Essential hypertension Stable.  Diabetes mellitus type 2 Monitor CBGs.  History of stroke Continue aspirin.  Chronic systolic CHF EF is 45 to 44%. Recently admitted for volume overload which corrected after hemodialysis.  DVT Prophylaxis: SCDs Code Status: Full code Family Communication: Discussed with patient Disposition Plan: He mentions that he was in a skilled nursing facility  Status is: Inpatient Remains inpatient appropriate because: Abnormal LFTs, end-stage renal disease      Medications: Scheduled:  amLODipine  5 mg Oral Daily   aspirin  81 mg Oral Daily   sevelamer carbonate  2,400 mg Oral TID WC   Continuous: PRN:  Antibiotics: Anti-infectives (From admission, onward)    None       Objective:  Vital Signs  Vitals:   10/29/22 2000 10/29/22 2015 10/29/22 2138 10/30/22 0719  BP: 93/70 115/76 115/72 117/74  Pulse: 83 84 85 80  Resp: 16 18 17 15   Temp:   98.7 F (37.1 C)   TempSrc:      SpO2: 95% 96% 97% 100%   No intake or output data in the 24 hours ending 10/30/22 0904 There were no vitals filed for this visit.  General appearance: Awake alert.  In no distress Resp: Clear to auscultation bilaterally.  Normal effort Cardio: S1-S2 is normal regular.  No S3-S4.  No rubs murmurs or bruit GI: Abdomen is soft.  Mildly tender in the epigastric and right upper quadrant without any rebound rigidity or guarding.  Bowel sounds present. Extremities: No edema.  Full range of motion of lower extremities. Neurologic: No focal neurological deficits.    Lab Results:  Data Reviewed: I have personally reviewed following labs and  reports of the imaging studies  CBC: Recent Labs  Lab 10/29/22 1438  WBC 8.8  NEUTROABS 4.7  HGB 10.5*  HCT 32.2*  MCV 87.3  PLT 086    Basic Metabolic Panel: Recent Labs  Lab 10/29/22 1438  10/30/22 0728  NA 138 137  K 5.3* 5.2*  CL 96* 93*  CO2 28 30  GLUCOSE 55* 126*  BUN 34* 38*  CREATININE 8.06* 9.28*  CALCIUM 9.4 9.5    GFR: Estimated Creatinine Clearance: 10.7 mL/min (A) (by C-G formula based on SCr of 9.28 mg/dL (H)).  Liver Function Tests: Recent Labs  Lab 10/29/22 1438 10/29/22 1935 10/30/22 0728  AST 54*  --  43*  ALT 36  --  34  ALKPHOS 276*  --  287*  BILITOT 6.5* 6.6* 7.3*  PROT 8.5*  --  8.7*  ALBUMIN 2.4*  --  2.4*    Recent Labs  Lab 10/29/22 1935  LIPASE 22   Recent Labs  Lab 10/29/22 1438  AMMONIA 78*    Coagulation Profile: Recent Labs  Lab 10/29/22 1438  INR 1.4*     CBG: Recent Labs  Lab 10/29/22 1545 10/29/22 1616 10/29/22 2014  GLUCAP 31* 101* 119*      Radiology Studies: MR ABDOMEN MRCP WO CONTRAST  Result Date: 10/30/2022 CLINICAL DATA:  61 year old male with suspected biliary obstruction. EXAM: MRI ABDOMEN WITHOUT CONTRAST  (INCLUDING MRCP) TECHNIQUE: Multiplanar multisequence MR imaging of the abdomen was performed. Heavily T2-weighted images of the biliary and pancreatic ducts were obtained, and three-dimensional MRCP images were rendered by post processing. COMPARISON:  No prior abdominal MRI. Abdominal ultrasound 10/29/2022. FINDINGS: Comment: Today's study is limited for detection and characterization of visceral and/or vascular lesions by lack of IV gadolinium. Lower chest: Small left pleural effusion lying dependently. Cardiomegaly. Small amount of pericardial fluid and thickening. Hepatobiliary: Diffuse low signal intensity throughout the hepatic parenchyma on in phase dual echo images, and diffuse low signal intensity on T2 weighted sequences, indicative of a background of severe iron deposition. No discrete cystic or solid hepatic lesions are confidently identified on today's noncontrast examination. No intra or extrahepatic biliary ductal dilatation noted on MRCP images. Common bile duct measures 4 mm in  the porta hepatis. No filling defect in the common bile duct to suggest choledocholithiasis. There are filling defects within the gallbladder measuring up to 1.5 cm in diameter, indicative of gallstones. Gallbladder is largely decompressed. Gallbladder wall appears mildly thickened and edematous, but there is no substantial volume of pericholecystic fluid. Pancreas: No definite pancreatic mass or peripancreatic fluid collections or inflammatory changes noted on today's noncontrast examination. No pancreatic ductal dilatation noted on MRCP images. Spleen: Diffuse loss of signal intensity throughout the spleen on in phase dual echo images and T2 weighted images indicative of iron deposition. Adrenals/Urinary Tract: Multiple subcentimeter T1 hypointense, T2 hyperintense lesions scattered throughout both kidneys, incompletely characterized on today's noncontrast examination, but statistically likely to represent small cysts (no imaging follow-up recommended). No hydroureteronephrosis in the visualized portions of the abdomen. Bilateral adrenal glands are normal in appearance. Stomach/Bowel: Visualized portions are unremarkable. Vascular/Lymphatic: No aneurysm identified in the visualized abdominal vasculature. No lymphadenopathy noted in the abdomen. Other:  No significant volume of ascites. Musculoskeletal: No aggressive appearing osseous lesions are noted in the visualized portions of the skeleton. IMPRESSION: 1. Study is positive for cholelithiasis. While there is some mild gallbladder wall thickening and edema, the gallbladder is nearly completely decompressed. These findings in conjunction with the recent ultrasound  examination do not support a diagnosis of acute cholecystitis at this time. 2. No evidence of choledocholithiasis or biliary tract obstruction. 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 Electronically Signed   By: Vinnie Langton M.D.   On: 10/30/2022 05:27   DG Shoulder Left  Result Date:  10/29/2022 CLINICAL DATA:  Left shoulder pain no injury EXAM: LEFT SHOULDER - 2+ VIEW COMPARISON:  Left shoulder 02/06/2008 FINDINGS: There is no evidence of fracture or dislocation. There is no evidence of arthropathy or other focal bone abnormality. Vascular stent overlying the proximal humerus. IMPRESSION: Negative. Electronically Signed   By: Franchot Gallo M.D.   On: 10/29/2022 16:07   US Abdomen Limited RUQ (LIVER/GB)  Result Date: 10/29/2022 CLINICAL DATA:  Right upper quadrant pain for 1 week. EXAM: ULTRASOUND ABDOMEN LIMITED RIGHT UPPER QUADRANT COMPARISON:  None Available. FINDINGS: Gallbladder: Shadowing echogenic stones measure up to 1.5 cm. Associated wall thickening, 8 mm. Negative sonographic Murphy sign. No pericholecystic fluid. Common bile duct: Diameter: Approximately 3 mm, within normal limits. No intrahepatic biliary ductal dilatation. Liver: Parenchymal echogenicity is within normal limits. No definite focal lesion. Portal vein is patent on color Doppler imaging with normal direction of blood flow towards the liver. Other: None. IMPRESSION: Cholelithiasis. Associated wall thickening without a positive sonographic Percell Miller sign can be seen in the setting of chronic cholecystitis. Electronically Signed   By: Lorin Picket M.D.   On: 10/29/2022 15:26       LOS: 1 day   [facility] Pager on [email or web address]  10/30/2022, 9:04 AM

## 2022-10-30 NOTE — TOC Initial Note (Addendum)
Transition of Care (TOC) - Initial/Assessment Note    Patient Details  Name: [name] SCHEAR Sr. MRN: [id number] Date of Birth: 1961/05/12  Transition of Care [facility]) CM/SW Contact:    Ninfa Meeker, RN Phone Number: 10/30/2022, 10:15 AM  Clinical Narrative:                 Transition of Care Initial Screening Note:  Transition of Care [facility]) Department has reviewed patient and no TOC needs have been identified at this time. We will continue to monitor patient advancement through Interdisciplinary progressions and if new patient needs arise, please place a consult.         Patient Goals and CMS Choice        Expected Discharge Plan and Services                                                Prior Living Arrangements/Services                       Activities of Daily Living Home Assistive Devices/Equipment: Wheelchair ADL Screening (condition at time of admission) Patient's cognitive ability adequate to safely complete daily activities?: Yes Is the patient deaf or have difficulty hearing?: No Does the patient have difficulty seeing, even when wearing glasses/contacts?: No Does the patient have difficulty concentrating, remembering, or making decisions?: No Patient able to express need for assistance with ADLs?: Yes Does the patient have difficulty dressing or bathing?: Yes Independently performs ADLs?: No Communication: Independent Dressing (OT): Needs assistance Is this a change from baseline?: Pre-admission baseline Grooming: Independent Feeding: Independent Bathing: Needs assistance Is this a change from baseline?: Pre-admission baseline Toileting: Needs assistance Is this a change from baseline?: Pre-admission baseline In/Out Bed: Needs assistance Is this a change from baseline?: Pre-admission baseline Walks in Home: Needs assistance Is this a change from baseline?: Pre-admission baseline Does the patient have difficulty walking or  climbing stairs?: Yes Weakness of Legs: Both Weakness of Arms/Hands: None  Permission Sought/Granted                  Emotional Assessment              Admission diagnosis:  RUQ abdominal pain [R10.11] Cholelithiasis [K80.20] Patient Active Problem List   Diagnosis Date Noted   Cholelithiasis 10/29/2022   Hyperbilirubinemia 10/29/2022   Volume overload 10/19/2022   CHF (congestive heart failure) ([place]) 10/18/2022   Hyperosmolar hyperglycemic state (HHS) ([place])    Diarrhea    Dehiscence of amputation stump of right lower extremity (HCC)    Abscess of right lower leg    Pressure injury of skin 07/02/2022   Cellulitis of right leg 07/01/2022   CAD (coronary artery disease) 05/24/2022   Leukocytosis 05/24/2022   Below-knee amputation of right lower extremity ([place]) 05/23/2022   Obesity (BMI 30-39.9) 02/19/2022   History of CVA  02/05/2022   Carotid stenosis 02/05/2022   Hyperkalemia 02/03/2022   acute on chronic Anemia of chronic renal failure  02/03/2022   Subacute osteomyelitis, right ankle and foot ([place]) 22/12/5425   Acute metabolic encephalopathy 05/10/7627   Protein-calorie malnutrition, mild ([place]) 02/02/2022   GERD (gastroesophageal reflux disease) 02/02/2022   Acute on chronic systolic CHF (congestive heart failure) ([place]) 08/14/2019   LBBB (left bundle branch block) 08/13/2019   Problem with vascular access 08/13/2019  Dialysis AV fistula malfunction ([place]) 08/12/2019   ESRD (end stage renal disease) ([place]) 09/20/2018   DM2 (diabetes mellitus, type 2) ([place]) 09/20/2018   HTN (hypertension) 09/20/2018   PCP:  Pcp, No Pharmacy:  No Pharmacies Listed    Social Determinants of Health (SDOH) Interventions    Readmission Risk Interventions     No data to display

## 2022-10-31 LAB — CBC
HCT: 31.8 % — ABNORMAL LOW (ref 39.0–52.0)
Hemoglobin: 10.9 g/dL — ABNORMAL LOW (ref 13.0–17.0)
MCH: 29.1 pg (ref 26.0–34.0)
MCHC: 34.3 g/dL (ref 30.0–36.0)
MCV: 84.8 fL (ref 80.0–100.0)
Platelets: 380 10*3/uL (ref 150–400)
RBC: 3.75 MIL/uL — ABNORMAL LOW (ref 4.22–5.81)
RDW: 21.9 % — ABNORMAL HIGH (ref 11.5–15.5)
WBC: 8.6 10*3/uL (ref 4.0–10.5)
nRBC: 0 % (ref 0.0–0.2)

## 2022-10-31 LAB — BILIRUBIN, FRACTIONATED(TOT/DIR/INDIR)
Bilirubin, Direct: 3.9 mg/dL — ABNORMAL HIGH (ref 0.0–0.2)
Indirect Bilirubin: 3.5 mg/dL — ABNORMAL HIGH (ref 0.3–0.9)
Total Bilirubin: 7.4 mg/dL — ABNORMAL HIGH (ref 0.3–1.2)

## 2022-10-31 LAB — COMPREHENSIVE METABOLIC PANEL
ALT: 35 U/L (ref 0–44)
AST: 37 U/L (ref 15–41)
Albumin: 2.3 g/dL — ABNORMAL LOW (ref 3.5–5.0)
Alkaline Phosphatase: 300 U/L — ABNORMAL HIGH (ref 38–126)
Anion gap: 15 (ref 5–15)
BUN: 28 mg/dL — ABNORMAL HIGH (ref 6–20)
CO2: 29 mmol/L (ref 22–32)
Calcium: 9.4 mg/dL (ref 8.9–10.3)
Chloride: 89 mmol/L — ABNORMAL LOW (ref 98–111)
Creatinine, Ser: 7 mg/dL — ABNORMAL HIGH (ref 0.61–1.24)
GFR, Estimated: 8 mL/min — ABNORMAL LOW (ref >60)
Glucose, Bld: 225 mg/dL — ABNORMAL HIGH (ref 70–99)
Potassium: 4.6 mmol/L (ref 3.5–5.1)
Sodium: 133 mmol/L — ABNORMAL LOW (ref 135–145)
Total Bilirubin: 7.5 mg/dL — ABNORMAL HIGH (ref 0.3–1.2)
Total Protein: 8.3 g/dL — ABNORMAL HIGH (ref 6.5–8.1)

## 2022-10-31 LAB — IRON AND TIBC
Iron: 46 ug/dL (ref 45–182)
Saturation Ratios: 27 % (ref 17.9–39.5)
TIBC: 174 ug/dL — ABNORMAL LOW (ref 250–450)
UIBC: 128 ug/dL

## 2022-10-31 LAB — GLUCOSE, CAPILLARY
Glucose-Capillary: 212 mg/dL — ABNORMAL HIGH (ref 70–99)
Glucose-Capillary: 268 mg/dL — ABNORMAL HIGH (ref 70–99)
Glucose-Capillary: 136 mg/dL — ABNORMAL HIGH (ref 70–99)

## 2022-10-31 LAB — GAMMA GT: GGT: 293 U/L — ABNORMAL HIGH (ref 7–50)

## 2022-10-31 LAB — PHOSPHORUS: Phosphorus: 4.7 mg/dL — ABNORMAL HIGH (ref 2.5–4.6)

## 2022-10-31 LAB — MONONUCLEOSIS SCREEN: Mono Screen: NEGATIVE

## 2022-10-31 MED ORDER — INSULIN ASPART 100 UNIT/ML IJ SOLN: 0.0000 [IU] | Freq: Every day | INTRAMUSCULAR | Status: DC

## 2022-10-31 MED ORDER — CHLORHEXIDINE GLUCONATE CLOTH 2 % EX PADS
6.0000 | MEDICATED_PAD | Freq: Every day | CUTANEOUS | Status: DC
  Administered 2022-11-01 – 2022-11-08 (×7): 6 via TOPICAL

## 2022-10-31 MED ORDER — DOXERCALCIFEROL 4 MCG/2ML IV SOLN
4.0000 ug | INTRAVENOUS | Status: DC
  Administered 2022-11-01 – 2022-11-06 (×3): 4 ug via INTRAVENOUS
  Filled 2022-10-31 (×6): qty 2

## 2022-10-31 MED ORDER — HEPARIN SODIUM (PORCINE) 1000 UNIT/ML DIALYSIS
3000.0000 [IU] | INTRAMUSCULAR | Status: DC | PRN
  Filled 2022-10-31: qty 3

## 2022-10-31 MED ORDER — ENOXAPARIN SODIUM 40 MG/0.4ML IJ SOSY
40.0000 mg | PREFILLED_SYRINGE | INTRAMUSCULAR | Status: DC
  Administered 2022-10-31 – 2022-11-03 (×4): 40 mg via SUBCUTANEOUS
  Filled 2022-10-31 (×4): qty 0.4

## 2022-10-31 MED ORDER — PHYTONADIONE 5 MG PO TABS
10.0000 mg | ORAL_TABLET | Freq: Once | ORAL | Status: AC
  Administered 2022-10-31: 10 mg via ORAL
  Filled 2022-10-31: qty 2

## 2022-10-31 MED ORDER — INSULIN GLARGINE-YFGN 100 UNIT/ML ~~LOC~~ SOLN
20.0000 [IU] | Freq: Every day | SUBCUTANEOUS | Status: DC
  Administered 2022-10-31: 20 [IU] via SUBCUTANEOUS
  Filled 2022-10-31 (×2): qty 0.2

## 2022-10-31 MED ORDER — INSULIN ASPART 100 UNIT/ML IJ SOLN
0.0000 [IU] | Freq: Three times a day (TID) | INTRAMUSCULAR | Status: DC
  Administered 2022-10-31: 9 [IU] via SUBCUTANEOUS
  Administered 2022-11-01: 5 [IU] via SUBCUTANEOUS

## 2022-10-31 NOTE — Progress Notes (Signed)
PROGRESS NOTE    [name] Cruel Sr.  XWR:[id number] DOB: [birth year] DOA: 10/29/2022 PCP: [place], Betty G, MD   Brief Narrative:  61 y.o. male with medical history significant of ESRD on HD TTS, HFrEF, T2DM, PVD s/p right BKA, HTN who presents with neck pain, visual aura, abdominal pain. Patient reports having epigastric and left upper quadrant abdominal pain for the past 3 weeks.  Has had intermittent nausea, vomiting and diarrhea.  Reportedly already had ultrasound outpatient about 3 weeks ago showing gallstones and was getting workup outpatient. Right upper quadrant ultrasound shows cholelithiasis but no positive sonographic Murphy sign.  No wall thickening to suggest cholecystitis.  General surgery has evaluated and recommends GI consult.    Assessment & Plan:   Principal Problem:   Hyperbilirubinemia Active Problems:   ESRD (end stage renal disease) (HCC)   HTN (hypertension)   DM2 (diabetes mellitus, type 2) (HCC)   History of CVA    Hyperkalemia   Cholelithiasis   RUQ abdominal pain  Hyperbilirubinemia with abdominal pain/cholelithiasis: MRCP does not suggest choledocholithiasis.  No evidence for cholecystitis/HIDA scan negative.  Gallstones were seen. His alkaline phosphatase is mildly elevated.  Bilirubin is significantly elevated majority of it being direct.  Noted to have mildly elevated bilirubin in July but it was normal back in April. Etiology is unclear.  But bilirubin is still rising. Hepatitis panel unremarkable. Gastroenterology following.  Extensive workup in progress.   Visual aura/neck pain Patient has never had the symptoms previously.  He had a stroke earlier this year.  No history of seizures and migraine headaches.  CT head as well as EEG negative.  He did not have those complaints today during my evaluation.   End-stage renal disease/mild hyperkalemia Dialyzed on Tuesday Thursday Saturday schedule.  Has not missed hemodialysis.  Nephrology on board.    Essential hypertension Stable.  Continue amlodipine.   Diabetes mellitus type 2 Recent hemoglobin A1c in July was 8.2.  Will recheck hemoglobin A1c today.  Appears to be taking Lantus 32 units at home however he is not on any long-acting or any short-acting insulin here.  Blood sugar is elevated.   check hemoglobin A1c as mentioned above, start him on 20 units of Lantus and SSI.   History of stroke Continue aspirin.   Chronic systolic CHF EF is 45 to 19%. Recently admitted for volume overload which corrected after hemodialysis.  DVT prophylaxis: SCDs Start: 10/29/22 1950 will start him on Lovenox.    Code Status: Full Code  Family Communication:  None present at bedside.  Plan of care discussed with patient in length and he/she verbalized understanding and agreed with it.  Status is: Inpatient Remains inpatient appropriate because: Bilirubin rising, not cleared for discharge per GI.   Estimated body mass index is 30.45 kg/m as calculated from the following:   Height as of 10/18/22: 6\' 1"  (1.854 m).   Weight as of this encounter: 104.7 kg.  Pressure Injury 07/02/22 Buttocks Left;Medial Stage 3 -  Full thickness tissue loss. Subcutaneous fat may be visible but bone, tendon or muscle are NOT exposed. (Active)  07/02/22 0058  Location: Buttocks  Location Orientation: Left;Medial  Staging: Stage 3 -  Full thickness tissue loss. Subcutaneous fat may be visible but bone, tendon or muscle are NOT exposed.  Wound Description (Comments):   Present on Admission: Yes   Nutritional Assessment: Body mass index is 30.45 kg/m.Marland Kitchen Seen by dietician.  I agree with the assessment and plan as outlined below: Nutrition  Status:        . Skin Assessment: I have examined the patient's skin and I agree with the wound assessment as performed by the wound care RN as outlined below: Pressure Injury 07/02/22 Buttocks Left;Medial Stage 3 -  Full thickness tissue loss. Subcutaneous fat may be visible  but bone, tendon or muscle are NOT exposed. (Active)  07/02/22 0058  Location: Buttocks  Location Orientation: Left;Medial  Staging: Stage 3 -  Full thickness tissue loss. Subcutaneous fat may be visible but bone, tendon or muscle are NOT exposed.  Wound Description (Comments):   Present on Admission: Yes    Consultants:  GI and general surgery  Procedures:  ERCP  Antimicrobials:  Anti-infectives (From admission, onward)    None         Subjective: Patient seen and examined.  Still complains of abdominal pain.  No other complaint.  Objective: Vitals:   10/31/22 0207 10/31/22 0220 10/31/22 0416 10/31/22 0814  BP: 112/76 120/86 112/73 116/69  Pulse: 86 86 85 85  Resp: (!) 28 (!) 30 16 17   Temp: 98.7 F (37.1 C) 98.7 F (37.1 C) 98.1 F (36.7 C) 98.5 F (36.9 C)  TempSrc: Oral Oral Oral Oral  SpO2:  100% 94% 93%  Weight:  104.7 kg      Intake/Output Summary (Last 24 hours) at 10/31/2022 1124 Last data filed at 10/31/2022 0220 Gross per 24 hour  Intake --  Output 0 ml  Net 0 ml   Filed Weights   10/30/22 2245 10/31/22 0220  Weight: 104.1 kg 104.7 kg    Examination:  General exam: Appears calm and comfortable  Respiratory system: Clear to auscultation. Respiratory effort normal. Cardiovascular system: S1 & S2 heard, RRR. No JVD, murmurs, rubs, gallops or clicks. No pedal edema. Gastrointestinal system: Abdomen is nondistended, soft and generalized abdominal tenderness, more prominent in right upper quadrant and epigastric area. No organomegaly or masses felt. Normal bowel sounds heard. Central nervous system: Alert and oriented. No focal neurological deficits. Extremities: Symmetric 5 x 5 power. Skin: No rashes, lesions or ulcers  Data Reviewed: I have personally reviewed following labs and imaging studies  CBC: Recent Labs  Lab 10/29/22 1438 10/31/22 0538  WBC 8.8 8.6  NEUTROABS 4.7  --   HGB 10.5* 10.9*  HCT 32.2* 31.8*  MCV 87.3 84.8  PLT 349  250   Basic Metabolic Panel: Recent Labs  Lab 10/29/22 1438 10/30/22 0728 10/31/22 0538  NA 138 137 133*  K 5.3* 5.2* 4.6  CL 96* 93* 89*  CO2 28 30 29   GLUCOSE 55* 126* 225*  BUN 34* 38* 28*  CREATININE 8.06* 9.28* 7.00*  CALCIUM 9.4 9.5 9.4  PHOS  --   --  4.7*   GFR: Estimated Creatinine Clearance: 14.3 mL/min (A) (by C-G formula based on SCr of 7 mg/dL (H)). Liver Function Tests: Recent Labs  Lab 10/29/22 1438 10/29/22 1935 10/30/22 0728 10/31/22 0538  AST 54*  --  43* 37  ALT 36  --  34 35  ALKPHOS 276*  --  287* 300*  BILITOT 6.5* 6.6* 7.3* 7.5*  PROT 8.5*  --  8.7* 8.3*  ALBUMIN 2.4*  --  2.4* 2.3*   Recent Labs  Lab 10/29/22 1935  LIPASE 22   Recent Labs  Lab 10/29/22 1438  AMMONIA 78*   Coagulation Profile: Recent Labs  Lab 10/29/22 1438  INR 1.4*   Cardiac Enzymes: No results for input(s): "CKTOTAL", "CKMB", "CKMBINDEX", "TROPONINI" in the last  168 hours. BNP (last 3 results) No results for input(s): "PROBNP" in the last 8760 hours. HbA1C: No results for input(s): "HGBA1C" in the last 72 hours. CBG: Recent Labs  Lab 10/29/22 1545 10/29/22 1616 10/29/22 2014  GLUCAP 51* 101* 119*   Lipid Profile: No results for input(s): "CHOL", "HDL", "LDLCALC", "TRIG", "CHOLHDL", "LDLDIRECT" in the last 72 hours. Thyroid Function Tests: No results for input(s): "TSH", "T4TOTAL", "FREET4", "T3FREE", "THYROIDAB" in the last 72 hours. Anemia Panel: Recent Labs    10/30/22 1022 10/31/22 0538  FERRITIN 1,410*  --   TIBC  --  174*  IRON  --  46   Sepsis Labs: No results for input(s): "PROCALCITON", "LATICACIDVEN" in the last 168 hours.  No results found for this or any previous visit (from the past 240 hour(s)).   Radiology Studies: NM Hepato W/EF  Result Date: 10/30/2022 CLINICAL DATA:  Concern for cholecystitis. EXAM: NUCLEAR MEDICINE HEPATOBILIARY IMAGING WITH GALLBLADDER EF TECHNIQUE: Sequential images of the abdomen were obtained out to  60 minutes following intravenous administration of radiopharmaceutical. After oral ingestion of Ensure, gallbladder ejection fraction was determined. At 60 min, normal ejection fraction is greater than 33%. RADIOPHARMACEUTICALS:  5.4 mCi Tc-2m  Choletec IV COMPARISON:  MRI October 30, 2022 FINDINGS: Prompt uptake and biliary excretion of activity by the liver is seen. Gallbladder activity is visualized, consistent with patency of cystic duct. Biliary activity passes into small bowel, consistent with patent common bile duct. Calculated gallbladder ejection fraction is 74%. (Normal gallbladder ejection fraction with Ensure is greater than 33%.) IMPRESSION: 1.  Patent cystic and common bile ducts. 2.  Normal gallbladder ejection fraction. Electronically Signed   By: Dahlia Bailiff M.D.   On: 10/30/2022 15:40   CT HEAD WO CONTRAST (5MM)  Result Date: 10/30/2022 CLINICAL DATA:  Headache EXAM: CT HEAD WITHOUT CONTRAST CT CERVICAL SPINE WITHOUT CONTRAST TECHNIQUE: Multidetector CT imaging of the head and cervical spine was performed following the standard protocol without intravenous contrast. Multiplanar CT image reconstructions of the cervical spine were also generated. RADIATION DOSE REDUCTION: This exam was performed according to the departmental dose-optimization program which includes automated exposure control, adjustment of the mA and/or kV according to patient size and/or use of iterative reconstruction technique. COMPARISON:  CTA head/neck 02/03/22 FINDINGS: CT HEAD FINDINGS Brain: No evidence of hemorrhage, hydrocephalus, extra-axial collection or mass lesion/mass effect. There is a chronic infarct involving the left caudate head and the left lentiform nucleus. No CT evidence for new infarct. Vascular: No hyperdense vessel or unexpected calcification. Skull: Normal. Negative for fracture or focal lesion. Sinuses/Orbits: Right lens replacement. Polypoid mucosal thickening left maxillary sinus. Other: None  CT CERVICAL SPINE FINDINGS Limitations: Assessment is limited due to the degree of motion artifact. Alignment: Straightening of the normal cervical lordosis. Skull base and vertebrae: No acute fracture. No primary bone lesion or focal pathologic process. Soft tissues and spinal canal: No evidence of high-grade spinal canal stenosis. Disc levels: There are multilevel degenerative changes likely at least mild-to-moderate spinal canal narrowing at C5-C6 and C6-C7. Upper chest: Atherosclerotic calcifications of the branch vessels. Small left pleural effusion. No pneumothorax. Other: None IMPRESSION: 1. No acute intracranial abnormality. 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 2. No acute cervical spine fracture. Multilevel degenerative changes, with likely at least mild-to-moderate spinal canal narrowing at C5-C6 and C6-C7. 3. Small left pleural effusion. Electronically Signed   By: Marin Roberts M.D.   On: 10/30/2022 13:53   CT CERVICAL SPINE WO  CONTRAST  Result Date: 10/30/2022 CLINICAL DATA:  Headache EXAM: CT HEAD WITHOUT CONTRAST CT CERVICAL SPINE WITHOUT CONTRAST TECHNIQUE: Multidetector CT imaging of the head and cervical spine was performed following the standard protocol without intravenous contrast. Multiplanar CT image reconstructions of the cervical spine were also generated. RADIATION DOSE REDUCTION: This exam was performed according to the departmental dose-optimization program which includes automated exposure control, adjustment of the mA and/or kV according to patient size and/or use of iterative reconstruction technique. COMPARISON:  CTA head/neck 02/03/22 FINDINGS: CT HEAD FINDINGS Brain: No evidence of hemorrhage, hydrocephalus, extra-axial collection or mass lesion/mass effect. There is a chronic infarct involving the left caudate head and the left lentiform nucleus. No CT evidence for new infarct. Vascular: No hyperdense vessel or unexpected calcification.  Skull: Normal. Negative for fracture or focal lesion. Sinuses/Orbits: Right lens replacement. Polypoid mucosal thickening left maxillary sinus. Other: None CT CERVICAL SPINE FINDINGS Limitations: Assessment is limited due to the degree of motion artifact. Alignment: Straightening of the normal cervical lordosis. Skull base and vertebrae: No acute fracture. No primary bone lesion or focal pathologic process. Soft tissues and spinal canal: No evidence of high-grade spinal canal stenosis. Disc levels: There are multilevel degenerative changes likely at least mild-to-moderate spinal canal narrowing at C5-C6 and C6-C7. Upper chest: Atherosclerotic calcifications of the branch vessels. Small left pleural effusion. No pneumothorax. Other: None IMPRESSION: 1. No acute intracranial abnormality. 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 2. No acute cervical spine fracture. Multilevel degenerative changes, with likely at least mild-to-moderate spinal canal narrowing at C5-C6 and C6-C7. 3. Small left pleural effusion. Electronically Signed   By: Marin Roberts M.D.   On: 10/30/2022 13:53   EEG adult  Result Date: 10/30/2022 Derek Jack, MD     10/30/2022 12:47 PM Routine EEG Report [name] MCCORKLE Sr. is a 61 y.o. male with a history of altered mental status with visual auras who is undergoing an EEG to evaluate for seizures. Report: This EEG was acquired with electrodes placed according to the International 10-20 electrode system (including Fp1, Fp2, F3, F4, C3, C4, P3, P4, O1, O2, T3, T4, T5, T6, A1, A2, Fz, Cz, Pz). The following electrodes were missing or displaced: none. The occipital dominant rhythm was 8.5 Hz. This activity is reactive to stimulation. Drowsiness was manifested by background fragmentation; deeper stages of sleep were identified by K complexes and sleep spindles. There was no focal slowing. There were no interictal epileptiform discharges. There were no  electrographic seizures identified. Photic stimulation and hyperventilation were not performed. Impression: This EEG was obtained while awake and asleep and is normal.   Clinical Correlation: Normal EEGs, however, do not rule out epilepsy. [name], MD [facility] [phone] If 7pm- 7am, please page neurology on call as listed in [place].   MR ABDOMEN MRCP WO CONTRAST  Result Date: 10/30/2022 CLINICAL DATA:  61 year old male with suspected biliary obstruction. EXAM: MRI ABDOMEN WITHOUT CONTRAST  (INCLUDING MRCP) TECHNIQUE: Multiplanar multisequence MR imaging of the abdomen was performed. Heavily T2-weighted images of the biliary and pancreatic ducts were obtained, and three-dimensional MRCP images were rendered by post processing. COMPARISON:  No prior abdominal MRI. Abdominal ultrasound 10/29/2022. FINDINGS: Comment: Today's study is limited for detection and characterization of visceral and/or vascular lesions by lack of IV gadolinium. Lower chest: Small left pleural effusion lying dependently. Cardiomegaly. Small amount of pericardial fluid and thickening. Hepatobiliary: Diffuse low signal intensity throughout the hepatic parenchyma on in phase dual  echo images, and diffuse low signal intensity on T2 weighted sequences, indicative of a background of severe iron deposition. No discrete cystic or solid hepatic lesions are confidently identified on today's noncontrast examination. No intra or extrahepatic biliary ductal dilatation noted on MRCP images. Common bile duct measures 4 mm in the porta hepatis. No filling defect in the common bile duct to suggest choledocholithiasis. There are filling defects within the gallbladder measuring up to 1.5 cm in diameter, indicative of gallstones. Gallbladder is largely decompressed. Gallbladder wall appears mildly thickened and edematous, but there is no substantial volume of pericholecystic fluid. Pancreas: No definite pancreatic mass or peripancreatic  fluid collections or inflammatory changes noted on today's noncontrast examination. No pancreatic ductal dilatation noted on MRCP images. Spleen: Diffuse loss of signal intensity throughout the spleen on in phase dual echo images and T2 weighted images indicative of iron deposition. Adrenals/Urinary Tract: Multiple subcentimeter T1 hypointense, T2 hyperintense lesions scattered throughout both kidneys, incompletely characterized on today's noncontrast examination, but statistically likely to represent small cysts (no imaging follow-up recommended). No hydroureteronephrosis in the visualized portions of the abdomen. Bilateral adrenal glands are normal in appearance. Stomach/Bowel: Visualized portions are unremarkable. Vascular/Lymphatic: No aneurysm identified in the visualized abdominal vasculature. No lymphadenopathy noted in the abdomen. Other:  No significant volume of ascites. Musculoskeletal: No aggressive appearing osseous lesions are noted in the visualized portions of the skeleton. IMPRESSION: 1. Study is positive for cholelithiasis. While there is some mild gallbladder wall thickening and edema, the gallbladder is nearly completely decompressed. These findings in conjunction with the recent ultrasound examination do not support a diagnosis of acute cholecystitis at this time. 2. No evidence of choledocholithiasis or biliary tract obstruction. 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 Electronically Signed   By: Vinnie Langton M.D.   On: 10/30/2022 05:27   DG Shoulder Left  Result Date: 10/29/2022 CLINICAL DATA:  Left shoulder pain no injury EXAM: LEFT SHOULDER - 2+ VIEW COMPARISON:  Left shoulder 02/06/2008 FINDINGS: There is no evidence of fracture or dislocation. There is no evidence of arthropathy or other focal bone abnormality. Vascular stent overlying the proximal humerus. IMPRESSION: Negative. Electronically Signed   By: Franchot Gallo M.D.   On: 10/29/2022 16:07   US Abdomen Limited RUQ  (LIVER/GB)  Result Date: 10/29/2022 CLINICAL DATA:  Right upper quadrant pain for 1 week. EXAM: ULTRASOUND ABDOMEN LIMITED RIGHT UPPER QUADRANT COMPARISON:  None Available. FINDINGS: Gallbladder: Shadowing echogenic stones measure up to 1.5 cm. Associated wall thickening, 8 mm. Negative sonographic Murphy sign. No pericholecystic fluid. Common bile duct: Diameter: Approximately 3 mm, within normal limits. No intrahepatic biliary ductal dilatation. Liver: Parenchymal echogenicity is within normal limits. No definite focal lesion. Portal vein is patent on color Doppler imaging with normal direction of blood flow towards the liver. Other: None. IMPRESSION: Cholelithiasis. Associated wall thickening without a positive sonographic Percell Miller sign can be seen in the setting of chronic cholecystitis. Electronically Signed   By: Lorin Picket M.D.   On: 10/29/2022 15:26    Scheduled Meds:  amLODipine  5 mg Oral Daily   aspirin  81 mg Oral Daily   Chlorhexidine Gluconate Cloth  6 each Topical Q0600   sevelamer carbonate  2,400 mg Oral TID WC   Continuous Infusions:   LOS: 2 days   Darliss Cheney, MD [facility]  10/31/2022, 11:24 AM   *Please note that this is a verbal dictation therefore any spelling or grammatical errors are due to the "[employer]" system  interpretation.  Please page via [place] and do not message via secure chat for urgent patient care matters. Secure chat can be used for non urgent patient care matters.  How to contact the [facility] Attending or Consulting provider [place] or covering provider during after hours [place], for this patient?  Check the care team in [facility] and look for a) attending/consulting [facility] provider listed and b) the [facility] team listed. Page or secure chat [phone]. Log into [email or web address] and use [place]'s universal password to access. If you do not have the password, please contact the hospital operator. Locate the [facility] provider you are looking for under [facility] and page to a number that you can be directly reached. If you still have difficulty reaching the provider, please page the [facility] (Director on Call) for the Hospitalists listed on amion for assistance.

## 2022-10-31 NOTE — Care Management Important Message (Signed)
Important Message  Patient Details  Name: [name] BOORMAN Sr. MRN: [id number] Date of Birth: 1961/01/08   Medicare Important Message Given:  Yes     Earnstine Meinders 10/31/2022, 1:23 PM

## 2022-10-31 NOTE — Progress Notes (Signed)
Patient ID: [name] Cruel Sr., male   DOB: 01-25-1961, 61 y.o.   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 bili 7.5/alk phos 300/AST 35/ALT 35  Patient says he is feeling better not having any significant abdominal pain and has been able to eat, denies any nausea or vomiting He is not a great historian but also tells me he has not had any headache or visual aura today      Objective   Vital signs in last 24 hours: Temp:  [98 F (36.7 C)-98.7 F (37.1 C)] 98.5 F (36.9 C) (12/15 0814) Pulse Rate:  [83-87] 85 (12/15 0814) Resp:  [10-30] 17 (12/15 0814) BP: (91-120)/(61-86) 116/69 (12/15 0814) SpO2:  [91 %-100 %] 93 % (12/15 0814) Weight:  [104.1 kg-104.7 kg] 104.7 kg (12/15 0220) Last BM Date : 10/29/22 General: Older African-American male in NAD Heart:  Regular rate and rhythm; no murmurs Lungs: Respirations even and unlabored, lungs CTA bilaterally Abdomen:  Soft, obese he is mildly tender in the right upper quadrant and left lower quadrant no guarding or rebound, normal bowel sounds. Extremities: Status post right BKA Neurologic:  Alert and oriented, no focal neurologic deficits Psych:  Cooperative. Normal mood and affect.  Intake/Output from previous day: No intake/output data recorded. Intake/Output this shift: No intake/output data recorded.  Lab Results: Recent Labs    10/29/22 1438 10/31/22 0538  WBC 8.8 8.6  HGB 10.5* 10.9*  HCT 32.2* 31.8*   PLT 349 380   BMET Recent Labs    10/29/22 1438 10/30/22 0728 10/31/22 0538  NA 138 137 133*  K 5.3* 5.2* 4.6  CL 96* 93* 89*  CO2 _0 GLUCOSE 55* 126* 225*  BUN 34* 38* 28*  CREATININE 8.06* 9.28* 7.00*  CALCIUM 9.4 9.5 9.4   LFT Recent Labs    10/29/22 1935 10/30/22 0728 10/31/22 0538  PROT  --    < > 8.3*  ALBUMIN  --    < > 2.3*  AST  --    < > 37  ALT  --    < > 35  ALKPHOS  --    < > 300*  BILITOT 6.6*   < > 7.5*  BILIDIR 4.0*  --   --   IBILI 2.6*  --   --    < > = values in this interval not displayed.   PT/INR Recent Labs    10/29/22 1438  LABPROT 16.7*  INR 1.4*    Studies/Results: NM Hepato W/EF  Result Date: 10/30/2022 CLINICAL DATA:  Concern for cholecystitis. EXAM: NUCLEAR MEDICINE HEPATOBILIARY IMAGING WITH GALLBLADDER EF TECHNIQUE: Sequential images of the abdomen were obtained out to 60 minutes following intravenous administration of radiopharmaceutical. After oral ingestion of Ensure, gallbladder ejection fraction was determined. At 60 min, normal ejection fraction is greater than 33%. RADIOPHARMACEUTICALS:  5.4 mCi Tc-26m Choletec IV COMPARISON:  MRI October 30, 2022 FINDINGS: Prompt uptake and biliary excretion of activity by the liver is seen. Gallbladder activity is visualized, consistent with patency  of cystic duct. Biliary activity passes into small bowel, consistent with patent common bile duct. Calculated gallbladder ejection fraction is 74%. (Normal gallbladder ejection fraction with Ensure is greater than 33%.) IMPRESSION: 1.  Patent cystic and common bile ducts. 2.  Normal gallbladder ejection fraction. Electronically Signed   By: Dahlia Bailiff M.D.   On: 10/30/2022 15:40   CT HEAD WO CONTRAST (5MM)  Result Date: 10/30/2022 CLINICAL DATA:  Headache EXAM: CT HEAD WITHOUT CONTRAST CT CERVICAL SPINE WITHOUT CONTRAST TECHNIQUE: Multidetector CT imaging of the head and cervical spine was performed following the standard protocol  without intravenous contrast. Multiplanar CT image reconstructions of the cervical spine were also generated. RADIATION DOSE REDUCTION: This exam was performed according to the departmental dose-optimization program which includes automated exposure control, adjustment of the mA and/or kV according to patient size and/or use of iterative reconstruction technique. COMPARISON:  CTA head/neck 02/03/22 FINDINGS: CT HEAD FINDINGS Brain: No evidence of hemorrhage, hydrocephalus, extra-axial collection or mass lesion/mass effect. There is a chronic infarct involving the left caudate head and the left lentiform nucleus. No CT evidence for new infarct. Vascular: No hyperdense vessel or unexpected calcification. Skull: Normal. Negative for fracture or focal lesion. Sinuses/Orbits: Right lens replacement. Polypoid mucosal thickening left maxillary sinus. Other: None CT CERVICAL SPINE FINDINGS Limitations: Assessment is limited due to the degree of motion artifact. Alignment: Straightening of the normal cervical lordosis. Skull base and vertebrae: No acute fracture. No primary bone lesion or focal pathologic process. Soft tissues and spinal canal: No evidence of high-grade spinal canal stenosis. Disc levels: There are multilevel degenerative changes likely at least mild-to-moderate spinal canal narrowing at C5-C6 and C6-C7. Upper chest: Atherosclerotic calcifications of the branch vessels. Small left pleural effusion. No pneumothorax. Other: None IMPRESSION: 1. No acute intracranial abnormality. 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 2. No acute cervical spine fracture. Multilevel degenerative changes, with likely at least mild-to-moderate spinal canal narrowing at C5-C6 and C6-C7. 3. Small left pleural effusion. Electronically Signed   By: Marin Roberts M.D.   On: 10/30/2022 13:53   CT CERVICAL SPINE WO CONTRAST  Result Date: 10/30/2022 CLINICAL DATA:  Headache EXAM: CT HEAD WITHOUT  CONTRAST CT CERVICAL SPINE WITHOUT CONTRAST TECHNIQUE: Multidetector CT imaging of the head and cervical spine was performed following the standard protocol without intravenous contrast. Multiplanar CT image reconstructions of the cervical spine were also generated. RADIATION DOSE REDUCTION: This exam was performed according to the departmental dose-optimization program which includes automated exposure control, adjustment of the mA and/or kV according to patient size and/or use of iterative reconstruction technique. COMPARISON:  CTA head/neck 02/03/22 FINDINGS: CT HEAD FINDINGS Brain: No evidence of hemorrhage, hydrocephalus, extra-axial collection or mass lesion/mass effect. There is a chronic infarct involving the left caudate head and the left lentiform nucleus. No CT evidence for new infarct. Vascular: No hyperdense vessel or unexpected calcification. Skull: Normal. Negative for fracture or focal lesion. Sinuses/Orbits: Right lens replacement. Polypoid mucosal thickening left maxillary sinus. Other: None CT CERVICAL SPINE FINDINGS Limitations: Assessment is limited due to the degree of motion artifact. Alignment: Straightening of the normal cervical lordosis. Skull base and vertebrae: No acute fracture. No primary bone lesion or focal pathologic process. Soft tissues and spinal canal: No evidence of high-grade spinal canal stenosis. Disc levels: There are multilevel degenerative changes likely at least mild-to-moderate spinal canal narrowing at C5-C6 and C6-C7. Upper chest: Atherosclerotic calcifications of the branch vessels. Small left pleural effusion. No pneumothorax. Other:  None IMPRESSION: 1. No acute intracranial abnormality. 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 2. No acute cervical spine fracture. Multilevel degenerative changes, with likely at least mild-to-moderate spinal canal narrowing at C5-C6 and C6-C7. 3. Small left pleural effusion. Electronically Signed    By: Marin Roberts M.D.   On: 10/30/2022 13:53   EEG adult  Result Date: 10/30/2022 Derek Jack, MD     10/30/2022 12:47 PM Routine EEG Report ZADKIEL DRAGAN Sr. is a 61 y.o. male with a history of altered mental status with visual auras who is undergoing an EEG to evaluate for seizures. Report: This EEG was acquired with electrodes placed according to the International 10-20 electrode system (including Fp1, Fp2, F3, F4, C3, C4, P3, P4, O1, O2, T3, T4, T5, T6, A1, A2, Fz, Cz, Pz). The following electrodes were missing or displaced: none. The occipital dominant rhythm was 8.5 Hz. This activity is reactive to stimulation. Drowsiness was manifested by background fragmentation; deeper stages of sleep were identified by K complexes and sleep spindles. There was no focal slowing. There were no interictal epileptiform discharges. There were no electrographic seizures identified. Photic stimulation and hyperventilation were not performed. Impression: This EEG was obtained while awake and asleep and is normal.   Clinical Correlation: Normal EEGs, however, do not rule out epilepsy. [name], MD [facility] [phone] If 7pm- 7am, please page neurology on call as listed in [place].   MR ABDOMEN MRCP WO CONTRAST  Result Date: 10/30/2022 CLINICAL DATA:  61 year old male with suspected biliary obstruction. EXAM: MRI ABDOMEN WITHOUT CONTRAST  (INCLUDING MRCP) TECHNIQUE: Multiplanar multisequence MR imaging of the abdomen was performed. Heavily T2-weighted images of the biliary and pancreatic ducts were obtained, and three-dimensional MRCP images were rendered by post processing. COMPARISON:  No prior abdominal MRI. Abdominal ultrasound 10/29/2022. FINDINGS: Comment: Today's study is limited for detection and characterization of visceral and/or vascular lesions by lack of IV gadolinium. Lower chest: Small left pleural effusion lying dependently. Cardiomegaly. Small amount of pericardial fluid and  thickening. Hepatobiliary: Diffuse low signal intensity throughout the hepatic parenchyma on in phase dual echo images, and diffuse low signal intensity on T2 weighted sequences, indicative of a background of severe iron deposition. No discrete cystic or solid hepatic lesions are confidently identified on today's noncontrast examination. No intra or extrahepatic biliary ductal dilatation noted on MRCP images. Common bile duct measures 4 mm in the porta hepatis. No filling defect in the common bile duct to suggest choledocholithiasis. There are filling defects within the gallbladder measuring up to 1.5 cm in diameter, indicative of gallstones. Gallbladder is largely decompressed. Gallbladder wall appears mildly thickened and edematous, but there is no substantial volume of pericholecystic fluid. Pancreas: No definite pancreatic mass or peripancreatic fluid collections or inflammatory changes noted on today's noncontrast examination. No pancreatic ductal dilatation noted on MRCP images. Spleen: Diffuse loss of signal intensity throughout the spleen on in phase dual echo images and T2 weighted images indicative of iron deposition. Adrenals/Urinary Tract: Multiple subcentimeter T1 hypointense, T2 hyperintense lesions scattered throughout both kidneys, incompletely characterized on today's noncontrast examination, but statistically likely to represent small cysts (no imaging follow-up recommended). No hydroureteronephrosis in the visualized portions of the abdomen. Bilateral adrenal glands are normal in appearance. Stomach/Bowel: Visualized portions are unremarkable. Vascular/Lymphatic: No aneurysm identified in the visualized abdominal vasculature. No lymphadenopathy noted in the abdomen. Other:  No significant volume of ascites. Musculoskeletal: No aggressive appearing osseous lesions are noted in the visualized  portions of the skeleton. IMPRESSION: 1. Study is positive for cholelithiasis. While there is some mild  gallbladder wall thickening and edema, the gallbladder is nearly completely decompressed. These findings in conjunction with the recent ultrasound examination do not support a diagnosis of acute cholecystitis at this time. 2. No evidence of choledocholithiasis or biliary tract obstruction. 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 Electronically Signed   By: Vinnie Langton M.D.   On: 10/30/2022 05:27   DG Shoulder Left  Result Date: 10/29/2022 CLINICAL DATA:  Left shoulder pain no injury EXAM: LEFT SHOULDER - 2+ VIEW COMPARISON:  Left shoulder 02/06/2008 FINDINGS: There is no evidence of fracture or dislocation. There is no evidence of arthropathy or other focal bone abnormality. Vascular stent overlying the proximal humerus. IMPRESSION: Negative. Electronically Signed   By: Franchot Gallo M.D.   On: 10/29/2022 16:07   US Abdomen Limited RUQ (LIVER/GB)  Result Date: 10/29/2022 CLINICAL DATA:  Right upper quadrant pain for 1 week. EXAM: ULTRASOUND ABDOMEN LIMITED RIGHT UPPER QUADRANT COMPARISON:  None Available. FINDINGS: Gallbladder: Shadowing echogenic stones measure up to 1.5 cm. Associated wall thickening, 8 mm. Negative sonographic Murphy sign. No pericholecystic fluid. Common bile duct: Diameter: Approximately 3 mm, within normal limits. No intrahepatic biliary ductal dilatation. Liver: Parenchymal echogenicity is within normal limits. No definite focal lesion. Portal vein is patent on color Doppler imaging with normal direction of blood flow towards the liver. Other: None. IMPRESSION: Cholelithiasis. Associated wall thickening without a positive sonographic Percell Miller sign can be seen in the setting of chronic cholecystitis. Electronically Signed   By: Lorin Picket M.D.   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 bili 6.5/alk phos 276 Prior history of known liver disease. Limited ultrasound showed normal-appearing liver gallstones and thickened gallbladder wall at 8 mm MRCP showed severe iron deposition in the liver ductal dilation, CBD of 4 mm no choledocholithiasis, gallstones present with mildly thickened gallbladder wall  HIDA scan yesterday normal  Patient feels better, with no significant pain and has not had any vomiting since has been here  T. bili up to 7.5, persistent alk phos elevation normal transaminases  Plan;   Etiology is not clear, we are doing workup to rule out hemochromatosis though that should not cause jaundice without underlying cirrhosis.  Reviewing labs he did have very minimally elevated alk phos and T. bili August 2023 with admission-this is likely a subacute process Will evaluate for autoimmune causes Repeat  Fractionated bili C-Met in a.m. He may need a liver biopsy pending results of numerous pending labs  #2 status post CVA #3.  Status post right BKA #4.  Peripheral vascular disease #5.  End-stage renal disease on dialysis      Principal Problem:   Hyperbilirubinemia Active Problems:   ESRD (end stage renal disease) (HCC)   DM2 (diabetes mellitus, type 2) (HCC)   HTN (hypertension)   Hyperkalemia   History of CVA    Cholelithiasis   RUQ abdominal pain     LOS: 2 days   Chealsey Miyamoto PA-C 10/31/2022, 10:45 AM

## 2022-10-31 NOTE — Progress Notes (Signed)
   10/31/22 0220  Vitals  Temp 98.7 F (37.1 C)  Temp Source Oral  BP 120/86  BP Location Right Arm  BP Method Automatic  Pulse Rate 86  Pulse Rate Source Monitor  ECG Heart Rate 87  Resp (!) 30  Post Treatment  Duration of HD Treatment -hour(s) 3 hour(s)  Liters Processed 72  Fluid Removed (mL) 0 mL  Tolerated HD Treatment Yes  AVG/AVF Arterial Site Held (minutes) 5 minutes  AVG/AVF Venous Site Held (minutes) 5 minutes   Fin. W/o difficulty.

## 2022-10-31 NOTE — Care Management Important Message (Signed)
Important Message  Patient Details  Name: [name] ROYLANCE Sr. MRN: [id number] Date of Birth: Jul 19, 1961   Medicare Important Message Given:  Yes     Kiing Deakin 10/31/2022, 1:32 PM

## 2022-10-31 NOTE — Progress Notes (Signed)
[facility] Progress Note  Subjective: seen in room, resting, no c/o's  Vitals:   10/31/22 0220 10/31/22 0416 10/31/22 0814 10/31/22 1551  BP: 120/86 112/73 116/69 98/65  Pulse: 86 85 85 86  Resp: (!) 30 16 17 16   Temp: 98.7 F (37.1 C) 98.1 F (36.7 C) 98.5 F (36.9 C) 98.3 F (36.8 C)  TempSrc: Oral Oral Oral Oral  SpO2: 100% 94% 93% 95%  Weight: 104.7 kg       Exam: Gen alert, no distress, no O2 No jvd or bruits Chest clear bilat to bases RRR no MRG Abd soft ntnd no mass or ascites +bs Ext no LE edema Neuro is alert, Ox 3 , nf, no asterixis    LUA AVF+bruit      Home meds include - amlodipine, renvela 3 ac tid      OP HD: [place] TTS 4h 38min 450/ 500   104kg   2/2 bath  Heparin 6000  LUA AVF - last HD 12/12, post wt 103.3kg - venofer 100 tiw IV thru 12/19 - hectorol 4 ug IV tiw - no esa, last Hb 11.0     ED afeb, normal BP to soft   Tbili 6.5, AST 54/ ALT 36   RUQ [place] +stones, neg murphy's    Assessment/ Plan: Abd pain/ jaundice - gen surgery and GI consulting. Per pmd ESRD - on HD TTS. Had HD here last night. Next HD Sat. HTN/ vol - euvolemic on exam, no edema. At dry wt. BP's low, no UF next HD.  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 Follow.  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 Cont renvela 3 tid ac. Cont IV vdra.  H/o CVA DM2 Debility - chronic, lives at [facility] 10/31/2022, 4:07 PM   Recent Labs  Lab 10/29/22 1438 10/30/22 0728 10/31/22 0538  HGB 10.5*  --  10.9*  ALBUMIN 2.4* 2.4* 2.3*  CALCIUM 9.4 9.5 9.4  PHOS  --   --  4.7*  CREATININE 8.06* 9.28* 7.00*  K 5.3* 5.2* 4.6   Recent Labs  Lab 10/30/22 1022 10/31/22 0538  IRON  --  46  TIBC  --  174*  FERRITIN 1,410*  --    Inpatient medications:  amLODipine  5 mg Oral Daily   aspirin  81 mg Oral Daily   Chlorhexidine Gluconate Cloth  6 each Topical Q0600   enoxaparin (LOVENOX) injection  40 mg Subcutaneous Q24H   insulin aspart   0-15 Units Subcutaneous TID WC   insulin aspart  0-5 Units Subcutaneous QHS   insulin glargine-yfgn  20 Units Subcutaneous Daily   sevelamer carbonate  2,400 mg Oral TID WC    acetaminophen, diphenhydrAMINE, heparin, morphine injection

## 2022-11-01 LAB — CBC
HCT: 29.1 % — ABNORMAL LOW (ref 39.0–52.0)
Hemoglobin: 10 g/dL — ABNORMAL LOW (ref 13.0–17.0)
MCH: 29.8 pg (ref 26.0–34.0)
MCHC: 34.4 g/dL (ref 30.0–36.0)
MCV: 86.6 fL (ref 80.0–100.0)
Platelets: 374 10*3/uL (ref 150–400)
RBC: 3.36 MIL/uL — ABNORMAL LOW (ref 4.22–5.81)
RDW: 21.9 % — ABNORMAL HIGH (ref 11.5–15.5)
WBC: 7.5 10*3/uL (ref 4.0–10.5)
nRBC: 0.3 % — ABNORMAL HIGH (ref 0.0–0.2)

## 2022-11-01 LAB — COMPREHENSIVE METABOLIC PANEL
ALT: 32 U/L (ref 0–44)
AST: 35 U/L (ref 15–41)
Albumin: 2.4 g/dL — ABNORMAL LOW (ref 3.5–5.0)
Alkaline Phosphatase: 257 U/L — ABNORMAL HIGH (ref 38–126)
Anion gap: 13 (ref 5–15)
BUN: 37 mg/dL — ABNORMAL HIGH (ref 6–20)
CO2: 29 mmol/L (ref 22–32)
Calcium: 9.6 mg/dL (ref 8.9–10.3)
Chloride: 91 mmol/L — ABNORMAL LOW (ref 98–111)
Creatinine, Ser: 9.09 mg/dL — ABNORMAL HIGH (ref 0.61–1.24)
GFR, Estimated: 6 mL/min — ABNORMAL LOW (ref >60)
Glucose, Bld: 220 mg/dL — ABNORMAL HIGH (ref 70–99)
Potassium: 5 mmol/L (ref 3.5–5.1)
Sodium: 133 mmol/L — ABNORMAL LOW (ref 135–145)
Total Bilirubin: 7.7 mg/dL — ABNORMAL HIGH (ref 0.3–1.2)
Total Protein: 8.3 g/dL — ABNORMAL HIGH (ref 6.5–8.1)

## 2022-11-01 LAB — ANA: Anti Nuclear Antibody (ANA): NEGATIVE

## 2022-11-01 LAB — GLUCOSE, CAPILLARY
Glucose-Capillary: 201 mg/dL — ABNORMAL HIGH (ref 70–99)
Glucose-Capillary: 166 mg/dL — ABNORMAL HIGH (ref 70–99)
Glucose-Capillary: 140 mg/dL — ABNORMAL HIGH (ref 70–99)

## 2022-11-01 LAB — PROTIME-INR
INR: 1.5 — ABNORMAL HIGH (ref 0.8–1.2)
Prothrombin Time: 18.3 seconds — ABNORMAL HIGH (ref 11.4–15.2)

## 2022-11-01 LAB — HEMOGLOBIN A1C
Hgb A1c MFr Bld: 5.2 % (ref 4.8–5.6)
Mean Plasma Glucose: 103 mg/dL

## 2022-11-01 MED ORDER — LIDOCAINE HCL (PF) 1 % IJ SOLN: 5.0000 mL | INTRAMUSCULAR | Status: DC | PRN

## 2022-11-01 MED ORDER — PHYTONADIONE 5 MG PO TABS
10.0000 mg | ORAL_TABLET | Freq: Once | ORAL | Status: AC
  Administered 2022-11-01: 10 mg via ORAL
  Filled 2022-11-01: qty 2

## 2022-11-01 MED ORDER — PENTAFLUOROPROP-TETRAFLUOROETH EX AERO
INHALATION_SPRAY | CUTANEOUS | Status: AC
  Administered 2022-11-01: 1 via TOPICAL
  Filled 2022-11-01: qty 30

## 2022-11-01 MED ORDER — INSULIN GLARGINE-YFGN 100 UNIT/ML ~~LOC~~ SOLN
30.0000 [IU] | Freq: Every day | SUBCUTANEOUS | Status: DC
  Administered 2022-11-01 – 2022-11-02 (×2): 30 [IU] via SUBCUTANEOUS
  Filled 2022-11-01 (×3): qty 0.3

## 2022-11-01 MED ORDER — HEPARIN SODIUM (PORCINE) 1000 UNIT/ML DIALYSIS
3000.0000 [IU] | INTRAMUSCULAR | Status: DC | PRN
  Administered 2022-11-01: 3000 [IU] via INTRAVENOUS_CENTRAL
  Filled 2022-11-01: qty 3

## 2022-11-01 MED ORDER — PENTAFLUOROPROP-TETRAFLUOROETH EX AERO: 1.0000 | INHALATION_SPRAY | CUTANEOUS | Status: DC | PRN

## 2022-11-01 MED ORDER — LIDOCAINE-PRILOCAINE 2.5-2.5 % EX CREA: 1.0000 | TOPICAL_CREAM | CUTANEOUS | Status: DC | PRN

## 2022-11-01 NOTE — Progress Notes (Signed)
PROGRESS NOTE    [name] Cruel Sr.  GGE:[id number] DOB: [birth year] DOA: 10/29/2022 PCP: [place], Betty G, MD   Brief Narrative:  61 y.o. male with medical history significant of ESRD on HD TTS, HFrEF, T2DM, PVD s/p right BKA, HTN who presents with neck pain, visual aura, abdominal pain. Patient reports having epigastric and left upper quadrant abdominal pain for the past 3 weeks.  Has had intermittent nausea, vomiting and diarrhea.  Reportedly already had ultrasound outpatient about 3 weeks ago showing gallstones and was getting workup outpatient. Right upper quadrant ultrasound shows cholelithiasis but no positive sonographic Murphy sign.  No wall thickening to suggest cholecystitis.  Seen by general surgery who signed off.  GI is following for hyperbilirubinemia and liver disease.  Assessment & Plan:   Principal Problem:   Hyperbilirubinemia Active Problems:   ESRD (end stage renal disease) (HCC)   HTN (hypertension)   DM2 (diabetes mellitus, type 2) (HCC)   History of CVA    Hyperkalemia   Cholelithiasis   RUQ abdominal pain  Hyperbilirubinemia with abdominal pain/cholelithiasis: MRCP does not suggest choledocholithiasis.  No evidence for cholecystitis/HIDA scan negative.  Gallstones were seen. His alkaline phosphatase is mildly elevated.  Bilirubin is significantly elevated majority of it being direct.  Noted to have mildly elevated bilirubin in July but it was normal back in April. Etiology is unclear.  But bilirubin is still rising. Hepatitis panel unremarkable. Gastroenterology following.  Extensive workup in progress.  They are thinking about possibly performing liver biopsy next week.   Visual aura/neck pain Patient has never had the symptoms previously.  He had a stroke earlier this year.  No history of seizures and migraine headaches.  CT head as well as EEG negative.  He did not have those complaints today during my evaluation.   End-stage renal disease/mild  hyperkalemia Dialyzed on Tuesday Thursday Saturday schedule.  Has not missed hemodialysis.  Nephrology on board.  Dialysis planned for today.   Essential hypertension Stable.  Continue amlodipine.   Diabetes mellitus type 2 Recent hemoglobin A1c in July was 8.2.  Repeat hemoglobin A1c 5.6.   Appears to be taking Lantus 32 units at home however he was not on any Lantus until yesterday when he was hyperglycemic, started him on 20 units sedately but is still hypoglycemic so I will increase this to 30 units.  Continue SSI.    History of stroke Continue aspirin.   Chronic systolic CHF EF is 45 to 46%. Recently admitted for volume overload which corrected after hemodialysis.  DVT prophylaxis: enoxaparin (LOVENOX) injection 40 mg Start: 10/31/22 1300 SCDs Start: 10/29/22 1950 will start him on Lovenox.    Code Status: Full Code  Family Communication:  None present at bedside.  Plan of care discussed with patient in length and he/she verbalized understanding and agreed with it.  Status is: Inpatient Remains inpatient appropriate because: Bilirubin rising, not cleared for discharge per GI.   Estimated body mass index is 30.57 kg/m as calculated from the following:   Height as of 10/18/22: 6\' 1"  (1.854 m).   Weight as of this encounter: 105.1 kg.  Pressure Injury 07/02/22 Buttocks Left;Medial Stage 3 -  Full thickness tissue loss. Subcutaneous fat may be visible but bone, tendon or muscle are NOT exposed. (Active)  07/02/22 0058  Location: Buttocks  Location Orientation: Left;Medial  Staging: Stage 3 -  Full thickness tissue loss. Subcutaneous fat may be visible but bone, tendon or muscle are NOT exposed.  Wound Description (Comments):  Present on Admission: Yes   Nutritional Assessment: Body mass index is 30.57 kg/m.Marland Kitchen Seen by dietician.  I agree with the assessment and plan as outlined below: Nutrition Status:        . Skin Assessment: I have examined the patient's skin and I  agree with the wound assessment as performed by the wound care RN as outlined below: Pressure Injury 07/02/22 Buttocks Left;Medial Stage 3 -  Full thickness tissue loss. Subcutaneous fat may be visible but bone, tendon or muscle are NOT exposed. (Active)  07/02/22 0058  Location: Buttocks  Location Orientation: Left;Medial  Staging: Stage 3 -  Full thickness tissue loss. Subcutaneous fat may be visible but bone, tendon or muscle are NOT exposed.  Wound Description (Comments):   Present on Admission: Yes    Consultants:  GI and general surgery  Procedures:  ERCP  Antimicrobials:  Anti-infectives (From admission, onward)    None         Subjective: Seen and examined.  Complains of mild abdominal pain.  No other complaint.  Objective: Vitals:   10/31/22 2025 11/01/22 0427 11/01/22 0500 11/01/22 0809  BP: 121/87 107/74  109/68  Pulse: 89 84  87  Resp: 18 18  16   Temp: 98.7 F (37.1 C) 98.9 F (37.2 C)  97.8 F (36.6 C)  TempSrc: Oral Oral  Oral  SpO2: 92% 97%  91%  Weight:   105.1 kg     Intake/Output Summary (Last 24 hours) at 11/01/2022 1034 Last data filed at 10/31/2022 1800 Gross per 24 hour  Intake 620 ml  Output --  Net 620 ml    Filed Weights   10/30/22 2245 10/31/22 0220 11/01/22 0500  Weight: 104.1 kg 104.7 kg 105.1 kg    Examination:  General exam: Appears calm and comfortable  Respiratory system: Clear to auscultation. Respiratory effort normal. Cardiovascular system: S1 & S2 heard, RRR. No JVD, murmurs, rubs, gallops or clicks. No pedal edema. Gastrointestinal system: Abdomen is nondistended, soft and mild generalized abdominal tenderness. No organomegaly or masses felt. Normal bowel sounds heard. Central nervous system: Alert and oriented. No focal neurological deficits. Extremities: Symmetric 5 x 5 power. Skin: No rashes, lesions or ulcers.  Psychiatry: Judgement and insight appear normal. Mood & affect appropriate.   Data Reviewed: I have  personally reviewed following labs and imaging studies  CBC: Recent Labs  Lab 10/29/22 1438 10/31/22 0538  WBC 8.8 8.6  NEUTROABS 4.7  --   HGB 10.5* 10.9*  HCT 32.2* 31.8*  MCV 87.3 84.8  PLT 349 409    Basic Metabolic Panel: Recent Labs  Lab 10/29/22 1438 10/30/22 0728 10/31/22 0538 11/01/22 0435  NA 138 137 133* 133*  K 5.3* 5.2* 4.6 5.0  CL 96* 93* 89* 91*  CO2 28 30 29 29   GLUCOSE 55* 126* 225* 220*  BUN 34* 38* 28* 37*  CREATININE 8.06* 9.28* 7.00* 9.09*  CALCIUM 9.4 9.5 9.4 9.6  PHOS  --   --  4.7*  --     GFR: Estimated Creatinine Clearance: 11 mL/min (A) (by C-G formula based on SCr of 9.09 mg/dL (H)). Liver Function Tests: Recent Labs  Lab 10/29/22 1438 10/29/22 1935 10/30/22 0728 10/31/22 0531 10/31/22 0538 11/01/22 0435  AST 54*  --  43*  --  37 35  ALT 36  --  34  --  35 32  ALKPHOS 276*  --  287*  --  300* 257*  BILITOT 6.5* 6.6* 7.3* 7.4* 7.5* 7.7*  PROT 8.5*  --  8.7*  --  8.3* 8.3*  ALBUMIN 2.4*  --  2.4*  --  2.3* 2.4*    Recent Labs  Lab 10/29/22 1935  LIPASE 22    Recent Labs  Lab 10/29/22 1438  AMMONIA 78*    Coagulation Profile: Recent Labs  Lab 10/29/22 1438 11/01/22 0435  INR 1.4* 1.5*    Cardiac Enzymes: No results for input(s): "CKTOTAL", "CKMB", "CKMBINDEX", "TROPONINI" in the last 168 hours. BNP (last 3 results) No results for input(s): "PROBNP" in the last 8760 hours. HbA1C: Recent Labs    10/31/22 0531  HGBA1C 5.2   CBG: Recent Labs  Lab 10/29/22 2014 10/31/22 1250 10/31/22 1639 10/31/22 2051 11/01/22 0807  GLUCAP 119* 212* 268* 136* 201*    Lipid Profile: No results for input(s): "CHOL", "HDL", "LDLCALC", "TRIG", "CHOLHDL", "LDLDIRECT" in the last 72 hours. Thyroid Function Tests: No results for input(s): "TSH", "T4TOTAL", "FREET4", "T3FREE", "THYROIDAB" in the last 72 hours. Anemia Panel: Recent Labs    10/30/22 1022 10/31/22 0538  FERRITIN 1,410*  --   TIBC  --  174*  IRON  --  46     Sepsis Labs: No results for input(s): "PROCALCITON", "LATICACIDVEN" in the last 168 hours.  No results found for this or any previous visit (from the past 240 hour(s)).   Radiology Studies: NM Hepato W/EF  Result Date: 10/30/2022 CLINICAL DATA:  Concern for cholecystitis. EXAM: NUCLEAR MEDICINE HEPATOBILIARY IMAGING WITH GALLBLADDER EF TECHNIQUE: Sequential images of the abdomen were obtained out to 60 minutes following intravenous administration of radiopharmaceutical. After oral ingestion of Ensure, gallbladder ejection fraction was determined. At 60 min, normal ejection fraction is greater than 33%. RADIOPHARMACEUTICALS:  5.4 mCi Tc-90m  Choletec IV COMPARISON:  MRI October 30, 2022 FINDINGS: Prompt uptake and biliary excretion of activity by the liver is seen. Gallbladder activity is visualized, consistent with patency of cystic duct. Biliary activity passes into small bowel, consistent with patent common bile duct. Calculated gallbladder ejection fraction is 74%. (Normal gallbladder ejection fraction with Ensure is greater than 33%.) IMPRESSION: 1.  Patent cystic and common bile ducts. 2.  Normal gallbladder ejection fraction. Electronically Signed   By: Dahlia Bailiff M.D.   On: 10/30/2022 15:40   CT HEAD WO CONTRAST (5MM)  Result Date: 10/30/2022 CLINICAL DATA:  Headache EXAM: CT HEAD WITHOUT CONTRAST CT CERVICAL SPINE WITHOUT CONTRAST TECHNIQUE: Multidetector CT imaging of the head and cervical spine was performed following the standard protocol without intravenous contrast. Multiplanar CT image reconstructions of the cervical spine were also generated. RADIATION DOSE REDUCTION: This exam was performed according to the departmental dose-optimization program which includes automated exposure control, adjustment of the mA and/or kV according to patient size and/or use of iterative reconstruction technique. COMPARISON:  CTA head/neck 02/03/22 FINDINGS: CT HEAD FINDINGS Brain: No evidence of  hemorrhage, hydrocephalus, extra-axial collection or mass lesion/mass effect. There is a chronic infarct involving the left caudate head and the left lentiform nucleus. No CT evidence for new infarct. Vascular: No hyperdense vessel or unexpected calcification. Skull: Normal. Negative for fracture or focal lesion. Sinuses/Orbits: Right lens replacement. Polypoid mucosal thickening left maxillary sinus. Other: None CT CERVICAL SPINE FINDINGS Limitations: Assessment is limited due to the degree of motion artifact. Alignment: Straightening of the normal cervical lordosis. Skull base and vertebrae: No acute fracture. No primary bone lesion or focal pathologic process. Soft tissues and spinal canal: No evidence of high-grade spinal canal stenosis. Disc levels: There are multilevel degenerative changes  likely at least mild-to-moderate spinal canal narrowing at C5-C6 and C6-C7. Upper chest: Atherosclerotic calcifications of the branch vessels. Small left pleural effusion. No pneumothorax. Other: None IMPRESSION: 1. No acute intracranial abnormality. 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 2. No acute cervical spine fracture. Multilevel degenerative changes, with likely at least mild-to-moderate spinal canal narrowing at C5-C6 and C6-C7. 3. Small left pleural effusion. Electronically Signed   By: Marin Roberts M.D.   On: 10/30/2022 13:53   CT CERVICAL SPINE WO CONTRAST  Result Date: 10/30/2022 CLINICAL DATA:  Headache EXAM: CT HEAD WITHOUT CONTRAST CT CERVICAL SPINE WITHOUT CONTRAST TECHNIQUE: Multidetector CT imaging of the head and cervical spine was performed following the standard protocol without intravenous contrast. Multiplanar CT image reconstructions of the cervical spine were also generated. RADIATION DOSE REDUCTION: This exam was performed according to the departmental dose-optimization program which includes automated exposure control, adjustment of the mA and/or kV  according to patient size and/or use of iterative reconstruction technique. COMPARISON:  CTA head/neck 02/03/22 FINDINGS: CT HEAD FINDINGS Brain: No evidence of hemorrhage, hydrocephalus, extra-axial collection or mass lesion/mass effect. There is a chronic infarct involving the left caudate head and the left lentiform nucleus. No CT evidence for new infarct. Vascular: No hyperdense vessel or unexpected calcification. Skull: Normal. Negative for fracture or focal lesion. Sinuses/Orbits: Right lens replacement. Polypoid mucosal thickening left maxillary sinus. Other: None CT CERVICAL SPINE FINDINGS Limitations: Assessment is limited due to the degree of motion artifact. Alignment: Straightening of the normal cervical lordosis. Skull base and vertebrae: No acute fracture. No primary bone lesion or focal pathologic process. Soft tissues and spinal canal: No evidence of high-grade spinal canal stenosis. Disc levels: There are multilevel degenerative changes likely at least mild-to-moderate spinal canal narrowing at C5-C6 and C6-C7. Upper chest: Atherosclerotic calcifications of the branch vessels. Small left pleural effusion. No pneumothorax. Other: None IMPRESSION: 1. No acute intracranial abnormality. 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 2. No acute cervical spine fracture. Multilevel degenerative changes, with likely at least mild-to-moderate spinal canal narrowing at C5-C6 and C6-C7. 3. Small left pleural effusion. Electronically Signed   By: Marin Roberts M.D.   On: 10/30/2022 13:53   EEG adult  Result Date: 10/30/2022 Derek Jack, MD     10/30/2022 12:47 PM Routine EEG Report [name] MAYORQUIN Sr. is a 61 y.o. male with a history of altered mental status with visual auras who is undergoing an EEG to evaluate for seizures. Report: This EEG was acquired with electrodes placed according to the International 10-20 electrode system (including Fp1, Fp2, F3, F4, C3, C4, P3,  P4, O1, O2, T3, T4, T5, T6, A1, A2, Fz, Cz, Pz). The following electrodes were missing or displaced: none. The occipital dominant rhythm was 8.5 Hz. This activity is reactive to stimulation. Drowsiness was manifested by background fragmentation; deeper stages of sleep were identified by K complexes and sleep spindles. There was no focal slowing. There were no interictal epileptiform discharges. There were no electrographic seizures identified. Photic stimulation and hyperventilation were not performed. Impression: This EEG was obtained while awake and asleep and is normal.   Clinical Correlation: Normal EEGs, however, do not rule out epilepsy. [name], MD [facility] [phone] If 7pm- 7am, please page neurology on call as listed in [place].    Scheduled Meds:  amLODipine  5 mg Oral Daily   aspirin  81 mg Oral Daily   Chlorhexidine Gluconate Cloth  6 each Topical Q0600   Chlorhexidine Gluconate Cloth  6 each Topical Q0600   doxercalciferol  4 mcg Intravenous Q T,Th,Sa-HD   enoxaparin (LOVENOX) injection  40 mg Subcutaneous Q24H   insulin aspart  0-15 Units Subcutaneous TID WC   insulin aspart  0-5 Units Subcutaneous QHS   insulin glargine-yfgn  30 Units Subcutaneous Daily   phytonadione  10 mg Oral Once   sevelamer carbonate  2,400 mg Oral TID WC   Continuous Infusions:   LOS: 3 days   Darliss Cheney, MD [facility]  11/01/2022, 10:34 AM   *Please note that this is a verbal dictation therefore any spelling or grammatical errors are due to the "[employer]" system interpretation.  Please page via [place] and do not message via secure chat for urgent patient care matters. Secure chat can be used for non urgent patient care matters.  How to contact the [facility] Attending or Consulting provider [place] or covering provider during after hours [place], for this patient?  Check the care team in [name] and look for a) attending/consulting [facility] provider listed and b) the [facility]  team listed. Page or secure chat [phone]. Log into [email or web address] and use [place]'s universal password to access. If you do not have the password, please contact the hospital operator. Locate the [facility] provider you are looking for under [facility] and page to a number that you can be directly reached. If you still have difficulty reaching the provider, please page the [place] (Director on Call) for the Hospitalists listed on amion for assistance.

## 2022-11-01 NOTE — Progress Notes (Signed)
Patient ID: [name] Cruel Sr., male   DOB: 1961/05/24, 61 y.o.   MRN: [id number]    Progress Note   Subjective   Day # 3 CC: Abdominal pain for 3 weeks, also altered mental status and visual orals on admission  Patient says he is feeling okay this morning, no specific complaints, denies any headache or visual aura, denies abdominal pain, ate his entire breakfast no nausea or vomiting  Fractionated bilirubin T. bili 7.4/direct bili 3.9/3.5 GGT 293 ANA negative Smooth muscle antibody mitochondrial antibody pending IgG and IgG4 pending Ferritin 1410 Iron 46/TIBC 174/iron sat 27 Hemochromatosis DNA pending BUN 28/creatinine 7 T. bili 7.5/alk phos 300/AST 37/ALT 35 INR 1.5     Objective   Vital signs in last 24 hours: Temp:  [97.8 F (36.6 C)-98.9 F (37.2 C)] 97.8 F (36.6 C) (12/16 0809) Pulse Rate:  [84-89] 87 (12/16 0809) Resp:  [16-18] 16 (12/16 0809) BP: (98-121)/(65-87) 109/68 (12/16 0809) SpO2:  [91 %-97 %] 91 % (12/16 0809) Weight:  [105.1 kg] 105.1 kg (12/16 0500) Last BM Date : 10/31/22 General: Older chronically ill-appearing African-American male in NAD sleeping, awakens to voice Heart:  Regular rate and rhythm; no murmurs Lungs: Respirations even and unlabored, lungs CTA bilaterally Abdomen:  Soft,  nondistended, continues to have some mild tenderness in the right upper quadrant and left lower quadrant no guarding or rebound. Normal bowel sounds. Extremities: Status post right BKA Neurologic:  Alert and oriented,  grossly normal neurologically. Psych:  Cooperative. Normal mood and affect.  Intake/Output from previous day: 12/15 0701 - 12/16 0700 In: 740 [P.O.:740] Out: -  Intake/Output this shift: No intake/output data recorded.  Lab Results: Recent Labs    10/29/22 1438 10/31/22 0538  WBC 8.8 8.6  HGB 10.5* 10.9*  HCT 32.2* 31.8*  PLT 349 380   BMET Recent Labs    10/30/22 0728 10/31/22 0538 11/01/22 0435  NA 137 133* 133*  K 5.2* 4.6 5.0   CL 93* 89* 91*  CO2 _0 GLUCOSE 126* 225* 220*  BUN 38* 28* 37*  CREATININE 9.28* 7.00* 9.09*  CALCIUM 9.5 9.4 9.6   LFT Recent Labs    10/31/22 0531 10/31/22 0538 11/01/22 0435  PROT  --    < > 8.3*  ALBUMIN  --    < > 2.4*  AST  --    < > 35  ALT  --    < > 32  ALKPHOS  --    < > 257*  BILITOT 7.4*   < > 7.7*  BILIDIR 3.9*  --   --   IBILI 3.5*  --   --    < > = values in this interval not displayed.   PT/INR Recent Labs    10/29/22 1438 11/01/22 0435  LABPROT 16.7* 18.3*  INR 1.4* 1.5*    Studies/Results: NM Hepato W/EF  Result Date: 10/30/2022 CLINICAL DATA:  Concern for cholecystitis. EXAM: NUCLEAR MEDICINE HEPATOBILIARY IMAGING WITH GALLBLADDER EF TECHNIQUE: Sequential images of the abdomen were obtained out to 60 minutes following intravenous administration of radiopharmaceutical. After oral ingestion of Ensure, gallbladder ejection fraction was determined. At 60 min, normal ejection fraction is greater than 33%. RADIOPHARMACEUTICALS:  5.4 mCi Tc-25m Choletec IV COMPARISON:  MRI October 30, 2022 FINDINGS: Prompt uptake and biliary excretion of activity by the liver is seen. Gallbladder activity is visualized, consistent with patency of cystic duct. Biliary activity passes into small bowel, consistent with patent common bile duct.  Calculated gallbladder ejection fraction is 74%. (Normal gallbladder ejection fraction with Ensure is greater than 33%.) IMPRESSION: 1.  Patent cystic and common bile ducts. 2.  Normal gallbladder ejection fraction. Electronically Signed   By: Dahlia Bailiff M.D.   On: 10/30/2022 15:40   CT HEAD WO CONTRAST (5MM)  Result Date: 10/30/2022 CLINICAL DATA:  Headache EXAM: CT HEAD WITHOUT CONTRAST CT CERVICAL SPINE WITHOUT CONTRAST TECHNIQUE: Multidetector CT imaging of the head and cervical spine was performed following the standard protocol without intravenous contrast. Multiplanar CT image reconstructions of the cervical spine were  also generated. RADIATION DOSE REDUCTION: This exam was performed according to the departmental dose-optimization program which includes automated exposure control, adjustment of the mA and/or kV according to patient size and/or use of iterative reconstruction technique. COMPARISON:  CTA head/neck 02/03/22 FINDINGS: CT HEAD FINDINGS Brain: No evidence of hemorrhage, hydrocephalus, extra-axial collection or mass lesion/mass effect. There is a chronic infarct involving the left caudate head and the left lentiform nucleus. No CT evidence for new infarct. Vascular: No hyperdense vessel or unexpected calcification. Skull: Normal. Negative for fracture or focal lesion. Sinuses/Orbits: Right lens replacement. Polypoid mucosal thickening left maxillary sinus. Other: None CT CERVICAL SPINE FINDINGS Limitations: Assessment is limited due to the degree of motion artifact. Alignment: Straightening of the normal cervical lordosis. Skull base and vertebrae: No acute fracture. No primary bone lesion or focal pathologic process. Soft tissues and spinal canal: No evidence of high-grade spinal canal stenosis. Disc levels: There are multilevel degenerative changes likely at least mild-to-moderate spinal canal narrowing at C5-C6 and C6-C7. Upper chest: Atherosclerotic calcifications of the branch vessels. Small left pleural effusion. No pneumothorax. Other: None IMPRESSION: 1. No acute intracranial abnormality. 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 2. No acute cervical spine fracture. Multilevel degenerative changes, with likely at least mild-to-moderate spinal canal narrowing at C5-C6 and C6-C7. 3. Small left pleural effusion. Electronically Signed   By: Marin Roberts M.D.   On: 10/30/2022 13:53   CT CERVICAL SPINE WO CONTRAST  Result Date: 10/30/2022 CLINICAL DATA:  Headache EXAM: CT HEAD WITHOUT CONTRAST CT CERVICAL SPINE WITHOUT CONTRAST TECHNIQUE: Multidetector CT imaging of the head and  cervical spine was performed following the standard protocol without intravenous contrast. Multiplanar CT image reconstructions of the cervical spine were also generated. RADIATION DOSE REDUCTION: This exam was performed according to the departmental dose-optimization program which includes automated exposure control, adjustment of the mA and/or kV according to patient size and/or use of iterative reconstruction technique. COMPARISON:  CTA head/neck 02/03/22 FINDINGS: CT HEAD FINDINGS Brain: No evidence of hemorrhage, hydrocephalus, extra-axial collection or mass lesion/mass effect. There is a chronic infarct involving the left caudate head and the left lentiform nucleus. No CT evidence for new infarct. Vascular: No hyperdense vessel or unexpected calcification. Skull: Normal. Negative for fracture or focal lesion. Sinuses/Orbits: Right lens replacement. Polypoid mucosal thickening left maxillary sinus. Other: None CT CERVICAL SPINE FINDINGS Limitations: Assessment is limited due to the degree of motion artifact. Alignment: Straightening of the normal cervical lordosis. Skull base and vertebrae: No acute fracture. No primary bone lesion or focal pathologic process. Soft tissues and spinal canal: No evidence of high-grade spinal canal stenosis. Disc levels: There are multilevel degenerative changes likely at least mild-to-moderate spinal canal narrowing at C5-C6 and C6-C7. Upper chest: Atherosclerotic calcifications of the branch vessels. Small left pleural effusion. No pneumothorax. Other: None IMPRESSION: 1. No acute intracranial abnormality. Chronic infarcts involving the left caudate head and  left lentiform nucleus. 2. No acute cervical spine fracture. Multilevel degenerative changes, with likely at least mild-to-moderate spinal canal narrowing at C5-C6 and C6-C7. 3. Small left pleural effusion. Electronically Signed   By: Marin Roberts M.D.   On: 10/30/2022 13:53   EEG adult  Result Date: 10/30/2022 [name] Jack, MD     10/30/2022 12:47 PM Routine EEG Report CHANDLAR GUICE Sr. is a 61 y.o. male with a history of altered mental status with visual auras who is undergoing an EEG to evaluate for seizures. Report: This EEG was acquired with electrodes placed according to the International 10-20 electrode system (including Fp1, Fp2, F3, F4, C3, C4, P3, P4, O1, O2, T3, T4, T5, T6, A1, A2, Fz, Cz, Pz). The following electrodes were missing or displaced: none. The occipital dominant rhythm was 8.5 Hz. This activity is reactive to stimulation. Drowsiness was manifested by background fragmentation; deeper stages of sleep were identified by K complexes and sleep spindles. There was no focal slowing. There were no interictal epileptiform discharges. There were no electrographic seizures identified. Photic stimulation and hyperventilation were not performed. Impression: This EEG was obtained while awake and asleep and is normal.   Clinical Correlation: Normal EEGs, however, do not rule out epilepsy. [name], MD [facility] [phone] If 7pm- 7am, please page neurology on call as listed in [place].       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, history of peripheral vascular disease and end-stage renal disease on dialysis with 3-week history of abdominal pain, reported intermittent vomiting and also had altered mental status on admission  Workup negative for any evidence of gallbladder disease does have gallstones Severe iron deposition noted in the liver on MRCP  no cirrhosis  His abdominal pain has significantly improved though on exam he still has some tenderness  He has had persistent jaundice, cholestatic pattern, with elevated direct bili and elevated GGT  Etiology is not clear ANA negative, other autoimmune labs pending  Hemochromatosis DNA pending  Drug-induced liver injury is definite  possibility  He had very mild elevation of T. bili and alk phos in August 2023 with admission for right BKA, he did require antibiotics for cellulitis after surgery and also had brief antibiotics in December with admission  Plan;  will give another dose of vitamin K today Await pending serologies/labs Hepatic panel in a.m.     Principal Problem:   Hyperbilirubinemia Active Problems:   ESRD (end stage renal disease) (HCC)   DM2 (diabetes mellitus, type 2) (HCC)   HTN (hypertension)   Hyperkalemia   History of CVA    Cholelithiasis   RUQ abdominal pain     LOS: 3 days   Kurt Hoffmeier PA-C 11/01/2022, 9:22 AM

## 2022-11-01 NOTE — Progress Notes (Signed)
[facility] Progress Note  Subjective: seen in room, resting, no c/o's  Vitals:   10/31/22 2025 11/01/22 0427 11/01/22 0500 11/01/22 0809  BP: 121/87 107/74  109/68  Pulse: 89 84  87  Resp: 18 18  16   Temp: 98.7 F (37.1 C) 98.9 F (37.2 C)  97.8 F (36.6 C)  TempSrc: Oral Oral  Oral  SpO2: 92% 97%  91%  Weight:   105.1 kg     Exam: Gen alert, no distress, no O2 No jvd or bruits Chest clear bilat to bases RRR no MRG Abd soft ntnd no mass or ascites +bs Ext no LE edema Neuro is alert, Ox 3 , nf, no asterixis    LUA AVF+bruit      Home meds include - amlodipine, renvela 3 ac tid      OP HD: [place] TTS 4h 42min 450/ 500   104kg   2/2 bath  Heparin 6000  LUA AVF - last HD 12/12, post wt 103.3kg - venofer 100 tiw IV thru 12/19 - hectorol 4 ug IV tiw - no esa, last Hb 11.0     ED afeb, normal BP to soft   Tbili 6.5, AST 54/ ALT 36   RUQ [place] +stones, neg murphy's    Assessment/ Plan: Abd pain/ jaundice - GI consulting. Considering liver bx this week, possible chronic process, evaluating for autoimmune, infection and other causes.  ESRD - on HD TTS. Had HD here last night. Next HD Sat. HTN/ vol - euvolemic on exam, up 1.5kg. BP's low normal. UFG 2-2.5 L today.   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 Follow.  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 Cont renvela 3 tid ac. Cont IV vdra.  H/o CVA DM2 Debility - chronic, lives at [facility] 11/01/2022, 11:52 AM   Recent Labs  Lab 10/29/22 1438 10/30/22 0728 10/31/22 0538 11/01/22 0435  HGB 10.5*  --  10.9*  --   ALBUMIN 2.4*   < > 2.3* 2.4*  CALCIUM 9.4   < > 9.4 9.6  PHOS  --   --  4.7*  --   CREATININE 8.06*   < > 7.00* 9.09*  K 5.3*   < > 4.6 5.0   < > = values in this interval not displayed.    Recent Labs  Lab 10/30/22 1022 10/31/22 0538  IRON  --  46  TIBC  --  174*  FERRITIN 1,410*  --     Inpatient medications:  amLODipine  5 mg Oral Daily   aspirin   81 mg Oral Daily   Chlorhexidine Gluconate Cloth  6 each Topical Q0600   Chlorhexidine Gluconate Cloth  6 each Topical Q0600   doxercalciferol  4 mcg Intravenous Q T,Th,Sa-HD   enoxaparin (LOVENOX) injection  40 mg Subcutaneous Q24H   insulin aspart  0-15 Units Subcutaneous TID WC   insulin aspart  0-5 Units Subcutaneous QHS   insulin glargine-yfgn  30 Units Subcutaneous Daily   phytonadione  10 mg Oral Once   sevelamer carbonate  2,400 mg Oral TID WC    acetaminophen, diphenhydrAMINE, heparin, [START ON 11/02/2022] heparin, morphine injection

## 2022-11-01 NOTE — Progress Notes (Signed)
   11/01/22 1819  Vitals  Temp 98.4 F (36.9 C)  Temp Source Oral  BP 103/72  MAP (mmHg) 83  BP Location Right Arm  BP Method Automatic  Patient Position (if appropriate) Lying  Pulse Rate 85  Pulse Rate Source Monitor  ECG Heart Rate 85  Resp (!) 27  Oxygen Therapy  SpO2 98 %  O2 Device Nasal Cannula  O2 Flow Rate (L/min) 2 L/min  Pulse Oximetry Type Continuous   Received patient in bed to unit.  Alert and oriented.  Informed consent signed and in chart.   Treatment initiated: 1502  Treatment completed: 1800  Patient tolerated well.  Transported back to the room  Alert, without acute distress.  Hand-off given to patient's nurse.   Access used: AVF Access issues: NA  Total UF removed: 1400 ml Medication(s) given: Heparin 3000 unit bolus at beginning of tx, Heparin 3000 unit bolus mid tx, Hectorol 25mcg IVP, tulenol 650 mg po, Benadryl 25 mg po Post HD VS: see above Post HD weight: 103.3kg   [facility]

## 2022-11-01 NOTE — Plan of Care (Signed)
Patient had a dialysis around 1700.  Report received from [facility], dialysis nurse that this patient is coming up early as he refused to continue dialysis. Patient was coughing hard where they end up giving him tylenol and benadryl to calm him down. His respiration was 27 and spo2 of 98% on 2L. He received 245ml f NS bolus and heparin while he was in dialysis per report. They removed 1400 ml total.

## 2022-11-01 NOTE — Plan of Care (Signed)

## 2022-11-02 DIAGNOSIS — K8020 Calculus of gallbladder without cholecystitis without obstruction: Secondary | ICD-10-CM | POA: Diagnosis not present

## 2022-11-02 LAB — PROTIME-INR
INR: 1.4 — ABNORMAL HIGH (ref 0.8–1.2)
Prothrombin Time: 17.1 seconds — ABNORMAL HIGH (ref 11.4–15.2)

## 2022-11-02 LAB — COMPREHENSIVE METABOLIC PANEL
ALT: 32 U/L (ref 0–44)
AST: 44 U/L — ABNORMAL HIGH (ref 15–41)
Albumin: 2.4 g/dL — ABNORMAL LOW (ref 3.5–5.0)
Alkaline Phosphatase: 293 U/L — ABNORMAL HIGH (ref 38–126)
Anion gap: 13 (ref 5–15)
BUN: 26 mg/dL — ABNORMAL HIGH (ref 6–20)
CO2: 30 mmol/L (ref 22–32)
Calcium: 9.6 mg/dL (ref 8.9–10.3)
Chloride: 91 mmol/L — ABNORMAL LOW (ref 98–111)
Creatinine, Ser: 7.34 mg/dL — ABNORMAL HIGH (ref 0.61–1.24)
GFR, Estimated: 8 mL/min — ABNORMAL LOW (ref >60)
Glucose, Bld: 91 mg/dL (ref 70–99)
Potassium: 4.2 mmol/L (ref 3.5–5.1)
Sodium: 134 mmol/L — ABNORMAL LOW (ref 135–145)
Total Bilirubin: 7.4 mg/dL — ABNORMAL HIGH (ref 0.3–1.2)
Total Protein: 8.6 g/dL — ABNORMAL HIGH (ref 6.5–8.1)

## 2022-11-02 LAB — GLUCOSE, CAPILLARY
Glucose-Capillary: 90 mg/dL (ref 70–99)
Glucose-Capillary: 71 mg/dL (ref 70–99)
Glucose-Capillary: 101 mg/dL — ABNORMAL HIGH (ref 70–99)
Glucose-Capillary: 93 mg/dL (ref 70–99)
Glucose-Capillary: 110 mg/dL — ABNORMAL HIGH (ref 70–99)

## 2022-11-02 LAB — IGG: IgG (Immunoglobin G), Serum: 2871 mg/dL — ABNORMAL HIGH (ref 603–1613)

## 2022-11-02 LAB — MITOCHONDRIAL ANTIBODIES: Mitochondrial M2 Ab, IgG: <20 Units (ref 0.0–20.0)

## 2022-11-02 LAB — ANTI-SMOOTH MUSCLE ANTIBODY, IGG: F-Actin IgG: 9 Units (ref 0–19)

## 2022-11-02 NOTE — Progress Notes (Signed)
[facility] Progress Note  Subjective: seen in room, no c/o  Vitals:   11/02/22 0426 11/02/22 0500 11/02/22 0820 11/02/22 1625  BP: 108/65  97/61 118/73  Pulse: 82  85 84  Resp: 18  16 18   Temp: 98.5 F (36.9 C)  97.8 F (36.6 C) 98.6 F (37 C)  TempSrc: Oral  Oral   SpO2: 94%  97% 91%  Weight:  103.5 kg      Exam: Gen alert, no distress, no O2 No jvd or bruits Chest clear bilat to bases RRR no MRG Abd soft ntnd no mass or ascites +bs Ext no LE edema Neuro is alert, Ox 3 , nf, no asterixis    LUA AVF+bruit      Home meds include - amlodipine, renvela 3 ac tid      OP HD: [place] TTS 4h 56min 450/ 500   104kg   2/2 bath  Heparin 6000  LUA AVF - last HD 12/12, post wt 103.3kg - venofer 100 tiw IV thru 12/19 - hectorol 4 ug IV tiw - no esa, last Hb 11.0     ED afeb, normal BP to soft   Tbili 6.5, AST 54/ ALT 36   RUQ [place] +stones, neg murphy's    Assessment/ Plan: Abd pain/ jaundice - GI consulting. Considering liver bx this week, possible chronic process, evaluating for autoimmune, infection and other causes. May need liver biopsy per GI.  ESRD - on HD TTS. Had HD Sat. Next HD 12/19.  HTN/ vol - euvolemic on exam, at dry wt. 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 Follow.  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 Cont renvela 3 tid ac. Cont IV vdra.  H/o CVA DM2 Debility - chronic, lives at [facility] 11/02/2022, 5:47 PM   Recent Labs  Lab 10/31/22 0538 11/01/22 0435 11/01/22 1630 11/02/22 0221  HGB 10.9*  --  10.0*  --   ALBUMIN 2.3* 2.4*  --  2.4*  CALCIUM 9.4 9.6  --  9.6  PHOS 4.7*  --   --   --   CREATININE 7.00* 9.09*  --  7.34*  K 4.6 5.0  --  4.2    Recent Labs  Lab 10/30/22 1022 10/31/22 0538  IRON  --  46  TIBC  --  174*  FERRITIN 1,410*  --     Inpatient medications:  amLODipine  5 mg Oral Daily   Chlorhexidine Gluconate Cloth  6 each Topical Q0600   Chlorhexidine Gluconate  Cloth  6 each Topical Q0600   doxercalciferol  4 mcg Intravenous Q T,Th,Sa-HD   enoxaparin (LOVENOX) injection  40 mg Subcutaneous Q24H   insulin aspart  0-15 Units Subcutaneous TID WC   insulin aspart  0-5 Units Subcutaneous QHS   insulin glargine-yfgn  30 Units Subcutaneous Daily   sevelamer carbonate  2,400 mg Oral TID WC    acetaminophen, diphenhydrAMINE, morphine injection

## 2022-11-02 NOTE — Progress Notes (Signed)
Progress Note   Subjective  Patient feels the same. Not in pain.    Objective   Vital signs in last 24 hours: Temp:  [97.8 F (36.6 C)-98.6 F (37 C)] 97.8 F (36.6 C) (12/17 0820) Pulse Rate:  [80-95] 85 (12/17 0820) Resp:  [16-34] 16 (12/17 0820) BP: (97-112)/(58-73) 97/61 (12/17 0820) SpO2:  [88 %-99 %] 97 % (12/17 0820) Weight:  [103.3 kg-104.9 kg] 103.5 kg (12/17 0500) Last BM Date : 11/02/22 General:    AA male in NAD Neurologic:  Alert and oriented,  grossly normal neurologically. Psych:  Cooperative. Normal mood and affect.  Intake/Output from previous day: 12/16 0701 - 12/17 0700 In: 120 [P.O.:120] Out: 1400  Intake/Output this shift: No intake/output data recorded.  Lab Results: Recent Labs    10/31/22 0538 11/01/22 1630  WBC 8.6 7.5  HGB 10.9* 10.0*  HCT 31.8* 29.1*  PLT 380 374   BMET Recent Labs    10/31/22 0538 11/01/22 0435 11/02/22 0221  NA 133* 133* 134*  K 4.6 5.0 4.2  CL 89* 91* 91*  CO2 _0 GLUCOSE 225* 220* 91  BUN 28* 37* 26*  CREATININE 7.00* 9.09* 7.34*  CALCIUM 9.4 9.6 9.6   LFT Recent Labs    10/31/22 0531 10/31/22 0538 11/02/22 0221  PROT  --    < > 8.6*  ALBUMIN  --    < > 2.4*  AST  --    < > 44*  ALT  --    < > 32  ALKPHOS  --    < > 293*  BILITOT 7.4*   < > 7.4*  BILIDIR 3.9*  --   --   IBILI 3.5*  --   --    < > = values in this interval not displayed.   PT/INR Recent Labs    11/01/22 0435 11/02/22 0221  LABPROT 18.3* 17.1*  INR 1.5* 1.4*    Studies/Results: No results found.     Assessment / Plan:    61 year old male admitted to the hospital with the following:  Jaundice - elevated direct bilirubinemia and alk phos  Patient had some abdominal discomfort with some intermittent vomiting and altered mental status on admission.  He has undergone a workup initially with an ultrasound which showed some gallstones in his gallbladder but a normal common bile duct.  Subsequent MRCP again  shows gallstones in his gallbladder however there is no evidence of choledocholithiasis or biliary tract obstruction.  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  He subsequently had HIDA scan which showed a patent cystic and common bile duct with no cholecystitis.  He has not had any upper abdominal pain recently. General surgery has seen and does not think he warrants cholecystectomy. We initially were concerned that he may have had choledocholithiasis and passed a stone, however with clear MRCP / HIDA and persistence of jaundice that does not appear to be the case.  Given his course and workup to date I do not think he has biliary obstruction causing this pattern of liver enzyme elevation.  He had a mild elevation noted back in August and he never had follow-up for this.  He could have progression of more chronic cholestasis over the past several months.  He is undergoing an extensive lab workup for this, several of which remain pending.  Drug-induced liver injury certainly possible, he has had some antibiotics including Augmentin in recent  months, however that would be unusual time course to cause persistence like this (he last had it in September /October).  INR slightly elevated, vitamin K given.  At this point in time the next step in his evaluation would be a liver biopsy.  It may be several more days before the serologies come back for his liver workup.  We discussed what a liver biopsy is and what it would entail, risks for bleeding etc.  Options are to discharge him from the hospital do this as outpatient versus keeping him inpatient for possible liver biopsy tomorrow.  I discussed his options with him, logistically it may be very difficult for him to come back and do an outpatient liver biopsy.  His preference is to stay in the hospital and perhaps do this tomorrow if IR can accommodate him.  We will consult IR for liver biopsy tomorrow.  Otherwise await  pending serologies.  He agrees with the plan.  PLAN: - consult IR for possible liver biopsy tomorrow - awaiting pending serologies - trend LFTs / INR daily  Dr. Bryan [name] assuming GI service tomorrow. Call with questions.  [name] Mango, MD [facility]

## 2022-11-02 NOTE — Progress Notes (Signed)
PROGRESS NOTE    [name] Cruel Sr.  IZT:[id number] DOB: 1961-09-15 DOA: 10/29/2022 PCP: [place], Betty G, MD   Brief Narrative:  61 y.o. male with medical history significant of ESRD on HD TTS, HFrEF, T2DM, PVD s/p right BKA, HTN who presents with neck pain, visual aura, abdominal pain. Patient reports having epigastric and left upper quadrant abdominal pain for the past 3 weeks.  Has had intermittent nausea, vomiting and diarrhea.  Reportedly already had ultrasound outpatient about 3 weeks ago showing gallstones and was getting workup outpatient. Right upper quadrant ultrasound shows cholelithiasis but no positive sonographic Murphy sign.  No wall thickening to suggest cholecystitis.  Seen by general surgery who signed off.  GI is following for hyperbilirubinemia and liver disease.  11/02/2022: Patient seen.  No new changes.  Liver biopsy is planned.  Aspirin is on hold, and may need to be held for about 3 days.  Lovenox will also be held prior to liver biopsy.  Assessment & Plan:   Principal Problem:   Hyperbilirubinemia Active Problems:   ESRD (end stage renal disease) (HCC)   DM2 (diabetes mellitus, type 2) (HCC)   HTN (hypertension)   Hyperkalemia   History of CVA    Cholelithiasis   RUQ abdominal pain  Hyperbilirubinemia with abdominal pain/cholelithiasis: MRCP does not suggest choledocholithiasis.  No evidence for cholecystitis/HIDA scan negative.  Gallstones were seen. His alkaline phosphatase is mildly elevated.  Bilirubin is significantly elevated majority of it being direct.  Noted to have mildly elevated bilirubin in July but it was normal back in April. Etiology is unclear.  But bilirubin is still rising. Hepatitis panel unremarkable. Gastroenterology following.  Extensive workup in progress.  They are thinking about possibly performing liver biopsy next week. 11/02/2022: Total bilirubin is 7.4 today.  Follow-up biopsy.  See above documentation.   Visual aura/neck  pain Patient has never had the symptoms previously.  He had a stroke earlier this year.  No history of seizures and migraine headaches.  CT head as well as EEG negative.  He did not have those complaints today during my evaluation. 11/03/2019: Stable.   End-stage renal disease/mild hyperkalemia Dialyzed on Tuesday Thursday Saturday schedule.  Has not missed hemodialysis.  Nephrology on board.  Dialysis planned for today. 11/02/2022: Nephrology input is appreciated.  Continue dialysis.   Essential hypertension Stable.  Continue amlodipine.   Diabetes mellitus type 2 Recent hemoglobin A1c in July was 8.2.  Repeat hemoglobin A1c 5.6.   Appears to be taking Lantus 32 units at home however he was not on any Lantus until yesterday when he was hyperglycemic, started him on 20 units sedately but is still hypoglycemic so I will increase this to 30 units.  Continue SSI.    History of stroke Continue aspirin.   Chronic systolic CHF EF is 45 to 38%. Recently admitted for volume overload which corrected after hemodialysis.  DVT prophylaxis: enoxaparin (LOVENOX) injection 40 mg Start: 10/31/22 1300 SCDs Start: 10/29/22 1950 will start him on Lovenox.    Code Status: Full Code  Family Communication:  None present at bedside.  Plan of care discussed with patient in length and he/she verbalized understanding and agreed with it.  Status is: Inpatient Remains inpatient appropriate because: Bilirubin rising, not cleared for discharge per GI.   Estimated body mass index is 30.1 kg/m as calculated from the following:   Height as of 10/18/22: 6\' 1"  (1.854 m).   Weight as of this encounter: 103.5 kg.  Pressure Injury 07/02/22  Buttocks Left;Medial Stage 3 -  Full thickness tissue loss. Subcutaneous fat may be visible but bone, tendon or muscle are NOT exposed. (Active)  07/02/22 0058  Location: Buttocks  Location Orientation: Left;Medial  Staging: Stage 3 -  Full thickness tissue loss. Subcutaneous fat  may be visible but bone, tendon or muscle are NOT exposed.  Wound Description (Comments):   Present on Admission: Yes   Nutritional Assessment: Body mass index is 30.1 kg/m.Marland Kitchen Seen by dietician.  I agree with the assessment and plan as outlined below: Nutrition Status:        . Skin Assessment: I have examined the patient's skin and I agree with the wound assessment as performed by the wound care RN as outlined below: Pressure Injury 07/02/22 Buttocks Left;Medial Stage 3 -  Full thickness tissue loss. Subcutaneous fat may be visible but bone, tendon or muscle are NOT exposed. (Active)  07/02/22 0058  Location: Buttocks  Location Orientation: Left;Medial  Staging: Stage 3 -  Full thickness tissue loss. Subcutaneous fat may be visible but bone, tendon or muscle are NOT exposed.  Wound Description (Comments):   Present on Admission: Yes    Consultants:  GI and general surgery  Procedures:  ERCP  Antimicrobials:  Anti-infectives (From admission, onward)    None         Subjective: Seen and examined.  Complains of mild abdominal pain.  No other complaint.  Objective: Vitals:   11/01/22 1920 11/02/22 0426 11/02/22 0500 11/02/22 0820  BP: 100/66 108/65  97/61  Pulse: 83 82  85  Resp: 20 18  16   Temp: 98.6 F (37 C) 98.5 F (36.9 C)  97.8 F (36.6 C)  TempSrc: Oral Oral  Oral  SpO2: 95% 94%  97%  Weight:   103.5 kg     Intake/Output Summary (Last 24 hours) at 11/02/2022 1318 Last data filed at 11/01/2022 1800 Gross per 24 hour  Intake --  Output 1400 ml  Net -1400 ml    Filed Weights   11/01/22 1445 11/01/22 1819 11/02/22 0500  Weight: 104.9 kg 103.3 kg 103.5 kg    Examination:  General exam: Appears calm and comfortable.  Patient is obese. Respiratory system: Clear to auscultation.  Cardiovascular system: S1 & S2 heard Gastrointestinal system: Abdomen is obese, soft and nontender.   Central nervous system: Alert and oriented. No focal neurological  deficits. Extremities: S/p right BKA.  No edema of left lower extremity.  Data Reviewed: I have personally reviewed following labs and imaging studies  CBC: Recent Labs  Lab 10/29/22 1438 10/31/22 0538 11/01/22 1630  WBC 8.8 8.6 7.5  NEUTROABS 4.7  --   --   HGB 10.5* 10.9* 10.0*  HCT 32.2* 31.8* 29.1*  MCV 87.3 84.8 86.6  PLT 349 380 536    Basic Metabolic Panel: Recent Labs  Lab 10/29/22 1438 10/30/22 0728 10/31/22 0538 11/01/22 0435 11/02/22 0221  NA 138 137 133* 133* 134*  K 5.3* 5.2* 4.6 5.0 4.2  CL 96* 93* 89* 91* 91*  CO2 28 30 29 29 30   GLUCOSE 55* 126* 225* 220* 91  BUN 34* 38* 28* 37* 26*  CREATININE 8.06* 9.28* 7.00* 9.09* 7.34*  CALCIUM 9.4 9.5 9.4 9.6 9.6  PHOS  --   --  4.7*  --   --     GFR: Estimated Creatinine Clearance: 13.5 mL/min (A) (by C-G formula based on SCr of 7.34 mg/dL (H)). Liver Function Tests: Recent Labs  Lab 10/29/22 1438 10/29/22 1935  10/30/22 0728 10/31/22 0531 10/31/22 0538 11/01/22 0435 11/02/22 0221  AST 54*  --  43*  --  37 35 44*  ALT 36  --  34  --  35 32 32  ALKPHOS 276*  --  287*  --  300* 257* 293*  BILITOT 6.5*   < > 7.3* 7.4* 7.5* 7.7* 7.4*  PROT 8.5*  --  8.7*  --  8.3* 8.3* 8.6*  ALBUMIN 2.4*  --  2.4*  --  2.3* 2.4* 2.4*   < > = values in this interval not displayed.    Recent Labs  Lab 10/29/22 1935  LIPASE 22    Recent Labs  Lab 10/29/22 1438  AMMONIA 78*    Coagulation Profile: Recent Labs  Lab 10/29/22 1438 11/01/22 0435 11/02/22 0221  INR 1.4* 1.5* 1.4*    Cardiac Enzymes: No results for input(s): "CKTOTAL", "CKMB", "CKMBINDEX", "TROPONINI" in the last 168 hours. BNP (last 3 results) No results for input(s): "PROBNP" in the last 8760 hours. HbA1C: Recent Labs    10/31/22 0531  HGBA1C 5.2    CBG: Recent Labs  Lab 11/01/22 1158 11/01/22 2120 11/02/22 0426 11/02/22 0817 11/02/22 1150  GLUCAP 166* 140* 90 71 101*    Lipid Profile: No results for input(s): "CHOL",  "HDL", "LDLCALC", "TRIG", "CHOLHDL", "LDLDIRECT" in the last 72 hours. Thyroid Function Tests: No results for input(s): "TSH", "T4TOTAL", "FREET4", "T3FREE", "THYROIDAB" in the last 72 hours. Anemia Panel: Recent Labs    10/31/22 0538  TIBC 174*  IRON 46    Sepsis Labs: No results for input(s): "PROCALCITON", "LATICACIDVEN" in the last 168 hours.  No results found for this or any previous visit (from the past 240 hour(s)).   Radiology Studies: No results found.  Scheduled Meds:  amLODipine  5 mg Oral Daily   Chlorhexidine Gluconate Cloth  6 each Topical Q0600   Chlorhexidine Gluconate Cloth  6 each Topical Q0600   doxercalciferol  4 mcg Intravenous Q T,Th,Sa-HD   enoxaparin (LOVENOX) injection  40 mg Subcutaneous Q24H   insulin aspart  0-15 Units Subcutaneous TID WC   insulin aspart  0-5 Units Subcutaneous QHS   insulin glargine-yfgn  30 Units Subcutaneous Daily   sevelamer carbonate  2,400 mg Oral TID WC   Continuous Infusions:   LOS: 4 days   Bonnell Public, MD [facility]  11/02/2022, 1:18 PM   *Please note that this is a verbal dictation therefore any spelling or grammatical errors are due to the "[employer]" system interpretation.  Please page via [place] and do not message via secure chat for urgent patient care matters. Secure chat can be used for non urgent patient care matters.  How to contact the [facility] Attending or Consulting provider [place] or covering provider during after hours [place], for this patient?  Check the care team in [facility] and look for a) attending/consulting TR[facility] provider listed and b) the [facility] team listed. Page or secure chat [phone]. Log into [email or web address] and use [place]'s universal password to access. If you do not have the password, please contact the hospital operator. Locate the [facility] provider you are looking for under [facility] and page to a number that you can be directly reached. If you still have difficulty  reaching the provider, please page the [facility] (Director on Call) for the Hospitalists listed on amion for assistance.

## 2022-11-02 NOTE — Plan of Care (Signed)

## 2022-11-03 DIAGNOSIS — N186 End stage renal disease: Secondary | ICD-10-CM | POA: Diagnosis not present

## 2022-11-03 LAB — COMPREHENSIVE METABOLIC PANEL
ALT: 31 U/L (ref 0–44)
AST: 38 U/L (ref 15–41)
Albumin: 2.4 g/dL — ABNORMAL LOW (ref 3.5–5.0)
Alkaline Phosphatase: 279 U/L — ABNORMAL HIGH (ref 38–126)
Anion gap: 13 (ref 5–15)
BUN: 34 mg/dL — ABNORMAL HIGH (ref 6–20)
CO2: 29 mmol/L (ref 22–32)
Calcium: 9.9 mg/dL (ref 8.9–10.3)
Chloride: 91 mmol/L — ABNORMAL LOW (ref 98–111)
Creatinine, Ser: 9.24 mg/dL — ABNORMAL HIGH (ref 0.61–1.24)
GFR, Estimated: 6 mL/min — ABNORMAL LOW (ref >60)
Glucose, Bld: 71 mg/dL (ref 70–99)
Potassium: 4.7 mmol/L (ref 3.5–5.1)
Sodium: 133 mmol/L — ABNORMAL LOW (ref 135–145)
Total Bilirubin: 7.6 mg/dL — ABNORMAL HIGH (ref 0.3–1.2)
Total Protein: 8.7 g/dL — ABNORMAL HIGH (ref 6.5–8.1)

## 2022-11-03 LAB — GLUCOSE, CAPILLARY
Glucose-Capillary: 52 mg/dL — ABNORMAL LOW (ref 70–99)
Glucose-Capillary: 92 mg/dL (ref 70–99)
Glucose-Capillary: 127 mg/dL — ABNORMAL HIGH (ref 70–99)
Glucose-Capillary: 121 mg/dL — ABNORMAL HIGH (ref 70–99)
Glucose-Capillary: 128 mg/dL — ABNORMAL HIGH (ref 70–99)

## 2022-11-03 LAB — HEPATITIS B SURFACE ANTIGEN: Hepatitis B Surface Ag: NONREACTIVE

## 2022-11-03 LAB — PROTIME-INR
INR: 1.4 — ABNORMAL HIGH (ref 0.8–1.2)
Prothrombin Time: 16.6 seconds — ABNORMAL HIGH (ref 11.4–15.2)

## 2022-11-03 MED ORDER — ENOXAPARIN SODIUM 30 MG/0.3ML IJ SOSY
30.0000 mg | PREFILLED_SYRINGE | INTRAMUSCULAR | Status: DC
  Administered 2022-11-05: 30 mg via SUBCUTANEOUS
  Filled 2022-11-03: qty 0.3

## 2022-11-03 MED ORDER — INSULIN GLARGINE-YFGN 100 UNIT/ML ~~LOC~~ SOLN
20.0000 [IU] | Freq: Every day | SUBCUTANEOUS | Status: DC
  Administered 2022-11-05 – 2022-11-07 (×3): 20 [IU] via SUBCUTANEOUS
  Filled 2022-11-03 (×5): qty 0.2

## 2022-11-03 MED ORDER — INSULIN ASPART 100 UNIT/ML IJ SOLN
0.0000 [IU] | Freq: Three times a day (TID) | INTRAMUSCULAR | Status: DC
  Administered 2022-11-03 – 2022-11-05 (×2): 2 [IU] via SUBCUTANEOUS
  Administered 2022-11-05 (×2): 3 [IU] via SUBCUTANEOUS

## 2022-11-03 NOTE — Progress Notes (Addendum)
Inpatient Diabetes Program Recommendations  AACE/ADA: New Consensus Statement on Inpatient Glycemic Control (2015)  Target Ranges:  Prepandial:   less than 140 mg/dL      Peak postprandial:   less than 180 mg/dL (1-2 hours)      Critically ill patients:  140 - 180 mg/dL   Lab Results  Component Value Date   GLUCAP 92 11/03/2022   HGBA1C 5.2 10/31/2022    Review of Glycemic Control  Latest Reference Range & Units 11/02/22 08:17 11/02/22 11:50 11/02/22 16:24 11/02/22 20:55 11/03/22 07:48 11/03/22 09:06  Glucose-Capillary 70 - 99 mg/dL 71 101 (H) 93 110 (H) 52 (L) 92   Diabetes history: DM 2 Outpatient Diabetes medications:  Lantus 32 units daily Current orders for Inpatient glycemic control:  Semglee 30 units daily Novolog moderate tid with meals and HS  Inpatient Diabetes Program Recommendations:    Please consider reducing Semglee to 22 units daily.  AM dose was held this morning due to hypoglycemia.  Also consider reducing Novolog correction to sensitive (0-9 units). Note that A1C is only 5.2%- Likely needs reduction in home dose of insulin as well.   Thanks,  Adah Perl, RN, BC-ADM Inpatient Diabetes Coordinator Pager [phone]  (8a-5p)

## 2022-11-03 NOTE — Progress Notes (Addendum)
Attending physician's note   I have taken a history, reviewed the chart, and examined the patient. I performed a substantive portion of this encounter, including complete performance of at least one of the key components, in conjunction with the APP. I agree with the APP's note, impression, and recommendations with my edits.   T. bili stable at 7.6. Liver biopsy planned for 12/22 after aspirin washout GI service will follow peripherally until after completion of liver biopsy and chance to review pathology.  Please do not hesitate to contact me with additional questions or concerns in the interim or if something should change clinically  Gerrit Heck, DO, FACG [phone] office                Daily Rounding Note  11/03/2022, 2:21 PM  LOS: 5 days   SUBJECTIVE:   Chief complaint:      L mid abd pain this AM, relieved w morphine, has not recurred.  Still w nausea and non bloody emesis this AM.  [place], formed stool this AMSlept poorly last night, now sleepy  OBJECTIVE:         Vital signs in last 24 hours:    Temp:  [98.2 F (36.8 C)-98.6 F (37 C)] 98.2 F (36.8 C) (12/18 0752) Pulse Rate:  [84-87] 87 (12/18 0752) Resp:  [16-19] 16 (12/18 0752) BP: (110-134)/(66-77) 110/77 (12/18 0752) SpO2:  [91 %-100 %] 100 % (12/18 0752) Last BM Date : 11/02/22 Filed Weights   11/01/22 1445 11/01/22 1819 11/02/22 0500  Weight: 104.9 kg 103.3 kg 103.5 kg   General: drowsy, arouseable.  Obese.   Heart: RRR Chest: clear  Abdomen: soft, NT.  + protuberant.  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  BKA R Neuro/Psych:  oriented to year, place.  Laconic wo much to say.  Speech is delayed.    Intake/Output from previous day: No intake/output data recorded.  Intake/Output this shift: No intake/output data recorded.  Lab Results: Recent Labs    11/01/22 1630  WBC 7.5  HGB 10.0*  HCT 29.1*  PLT 374   BMET Recent Labs     11/01/22 0435 11/02/22 0221 11/03/22 0333  NA 133* 134* 133*  K 5.0 4.2 4.7  CL 91* 91* 91*  CO2 _0 GLUCOSE 220* 91 71  BUN 37* 26* 34*  CREATININE 9.09* 7.34* 9.24*  CALCIUM 9.6 9.6 9.9   LFT Recent Labs    11/01/22 0435 11/02/22 0221 11/03/22 0333  PROT 8.3* 8.6* 8.7*  ALBUMIN 2.4* 2.4* 2.4*  AST 35 44* 38  ALT 32 32 31  ALKPHOS 257* 293* 279*  BILITOT 7.7* 7.4* 7.6*   PT/INR Recent Labs    11/02/22 0221 11/03/22 0333  LABPROT 17.1* 16.6*  INR 1.4* 1.4*   Hepatitis Panel Recent Labs    11/03/22 0331  HEPBSAG NON REACTIVE    Studies/Results: No results found.  ASSESMENT:     Jaundice wo biliary obstruction.  L abdominal pain.  Gallstones in GB.  HIDA normal.  MRCP w GB stones, normal CBD.  ? If having progressive cholestasis?, ?DILI?  Ferritin is elevated evaded at 1410.  ANA negative.  IgG elevated at 2871.  IgG4 pending.  Smooth muscle Ab negative. Mitochondrial Ab negative.  GGT elevated 293.  Hemochromatosis DNA PCR is in process.  Hep ABC serologies negative.   T bili not improving.  Alk phos remains elevated, minimally improved.  Minor elevation AST resolved.  ALT never elevated.  Anemia.  Hgb stable.      Hyponatremia.      ESRD on HD TTS    R BKA 05/2022, revision 06/2022.    PLAN     Inpt liver biopsy, ordered yesterday.  Planned for 12/22 to allow for 3 d hold of ASA.        Azucena Freed  11/03/2022, 2:21 PM Phone [phone]

## 2022-11-03 NOTE — Progress Notes (Signed)
Patient ID: [name], male   DOB: Jan 18, 1961, 61 y.o.   MRN: [id number] S: Still having left sided abdominal pain.  Also concerned because he had an appointment to get his prosthetic fitted on his RBKA today. O:BP 110/77 (BP Location: Right Arm)   Pulse 87   Temp 98.2 F (36.8 C) (Oral)   Resp 16   Wt 103.5 kg   SpO2 100%   BMI 30.10 kg/m  No intake or output data in the 24 hours ending 11/03/22 0947 Intake/Output: No intake/output data recorded.  Intake/Output this shift:  No intake/output data recorded. Weight change:  Gen: NAD CVS: RRR Resp:CTA Abd: +BS, soft, NT/ND Ext: no edema, s/p RBKA, LUE AVF +T/B  Recent Labs  Lab 10/29/22 1438 10/30/22 0728 10/31/22 0538 11/01/22 0435 11/02/22 0221 11/03/22 0333  NA 138 137 133* 133* 134* 133*  K 5.3* 5.2* 4.6 5.0 4.2 4.7  CL 96* 93* 89* 91* 91* 91*  CO2 28 30 29 29 30 29   GLUCOSE 55* 126* 225* 220* 91 71  BUN 34* 38* 28* 37* 26* 34*  CREATININE 8.06* 9.28* 7.00* 9.09* 7.34* 9.24*  ALBUMIN 2.4* 2.4* 2.3* 2.4* 2.4* 2.4*  CALCIUM 9.4 9.5 9.4 9.6 9.6 9.9  PHOS  --   --  4.7*  --   --   --   AST 54* 43* 37 35 44* 38  ALT 36 34 35 32 32 31   Liver Function Tests: Recent Labs  Lab 11/01/22 0435 11/02/22 0221 11/03/22 0333  AST 35 44* 38  ALT 32 32 31  ALKPHOS 257* 293* 279*  BILITOT 7.7* 7.4* 7.6*  PROT 8.3* 8.6* 8.7*  ALBUMIN 2.4* 2.4* 2.4*   Recent Labs  Lab 10/29/22 1935  LIPASE 22   Recent Labs  Lab 10/29/22 1438  AMMONIA 78*   CBC: Recent Labs  Lab 10/29/22 1438 10/31/22 0538 11/01/22 1630  WBC 8.8 8.6 7.5  NEUTROABS 4.7  --   --   HGB 10.5* 10.9* 10.0*  HCT 32.2* 31.8* 29.1*  MCV 87.3 84.8 86.6  PLT 349 380 374   Cardiac Enzymes: No results for input(s): "CKTOTAL", "CKMB", "CKMBINDEX", "TROPONINI" in the last 168 hours. CBG: Recent Labs  Lab 11/02/22 1150 11/02/22 1624 11/02/22 2055 11/03/22 0748 11/03/22 0906  GLUCAP 101* 93 110* 52* 92    Iron Studies: No results for  input(s): "IRON", "TIBC", "TRANSFERRIN", "FERRITIN" in the last 72 hours. Studies/Results: No results found.  amLODipine  5 mg Oral Daily   Chlorhexidine Gluconate Cloth  6 each Topical Q0600   Chlorhexidine Gluconate Cloth  6 each Topical Q0600   doxercalciferol  4 mcg Intravenous Q T,Th,Sa-HD   enoxaparin (LOVENOX) injection  40 mg Subcutaneous Q24H   insulin aspart  0-15 Units Subcutaneous TID WC   insulin aspart  0-5 Units Subcutaneous QHS   insulin glargine-yfgn  30 Units Subcutaneous Daily   sevelamer carbonate  2,400 mg Oral TID WC    BMET    Component Value Date/Time   NA 133 (L) 11/03/2022 0333   K 4.7 11/03/2022 0333   CL 91 (L) 11/03/2022 0333   CO2 29 11/03/2022 0333   GLUCOSE 71 11/03/2022 0333   BUN 34 (H) 11/03/2022 0333   CREATININE 9.24 (H) 11/03/2022 0333   CALCIUM 9.9 11/03/2022 0333   GFRNONAA 6 (L) 11/03/2022 0333   GFRAA 3 (L) 08/14/2019 2051   CBC    Component Value Date/Time   WBC 7.5 11/01/2022 1630  RBC 3.36 (L) 11/01/2022 1630   HGB 10.0 (L) 11/01/2022 1630   HCT 29.1 (L) 11/01/2022 1630   PLT 374 11/01/2022 1630   MCV 86.6 11/01/2022 1630   MCH 29.8 11/01/2022 1630   MCHC 34.4 11/01/2022 1630   RDW 21.9 (H) 11/01/2022 1630   LYMPHSABS 1.5 10/29/2022 1438   MONOABS 1.9 (H) 10/29/2022 1438   EOSABS 0.5 10/29/2022 1438   BASOSABS 0.1 10/29/2022 1438    OP HD: [place] TTS 4h 18min 450/ 500   104kg   2/2 bath  Heparin 6000  LUA AVF - last HD 12/12, post wt 103.3kg - venofer 100 tiw IV thru 12/19 - hectorol 4 ug IV tiw - no esa, last Hb 11.0     ED afeb, normal BP to soft   Tbili 6.5, AST 54/ ALT 36   RUQ [place] +stones, neg murphy's    Assessment/ Plan: Abd pain/ jaundice - GI consulting. Considering liver bx this week, possible chronic process, evaluating for autoimmune, infection and other causes. HIDA scan negative, MRCP showed gallstone but no evidence of biliary tract obstruction.  ESRD - on HD TTS. Had HD Sat. Next HD 12/19.  HTN/ vol  - euvolemic on exam, at dry wt. 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 Follow.  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 Cont renvela 3 tid ac. Cont IV vdra.  H/o CVA DM2 Debility - chronic, lives at [place], MD [facility]

## 2022-11-03 NOTE — Progress Notes (Signed)
PROGRESS NOTE    [name] Cruel Sr.  OJJ:[id number] DOB: May 15, 1961 DOA: 10/29/2022 PCP: [place], Betty G, MD   Brief Narrative:  This 61 y.o. male with PMH significant of ESRD on HD TTS, HFrEF, T2DM, PVD s/p right BKA, HTN who presented with c/o: neck pain, visual aura, abdominal pain. Patient reports having epigastric and left upper quadrant abdominal pain for the past 3 weeks. He also reports intermittent nausea, vomiting and diarrhea.  Reportedly already had ultrasound outpatient about 3 weeks ago showing gallstones and was getting workup outpatient. RUQ ultrasound showed cholelithiasis but no positive sonographic Murphy sign. No wall thickening to suggest cholecystitis. He was evaluated by general surgery who signed off.  GI is following for hyperbilirubinemia and liver disease. Plan: Liver biopsy this week. Aspirin on hold.  Assessment & Plan:   Principal Problem:   Hyperbilirubinemia Active Problems:   ESRD (end stage renal disease) (HCC)   HTN (hypertension)   DM2 (diabetes mellitus, type 2) (HCC)   History of CVA    Hyperkalemia   Cholelithiasis   RUQ abdominal pain  Abdominal pain with hyperbilirubinemia: MRCP does not suggest choledocholithiasis.   No evidence for cholecystitis / HIDA scan negative.  Gallstones were seen. Alkaline phosphatase is mildly elevated.  Bilirubin is significantly elevated majority of it being direct.   Noted to have mildly elevated bilirubin in July but it was normal back in April. Etiology is unclear, bilirubin is still rising. Hepatitis panel unremarkable. Gastroenterology consulted.  Extensive workup in progress.   Plan is for liver biopsy this week, aspirin is on hold.   Total bilirubin is 7.6 today.  Follow-up biopsy.     Visual aura/ Neck pain: Patient denies the symptoms in the past. He had a stroke earlier this year.  No history of seizures and migraine headaches.   CT head as well as EEG negative.  He denies any neck pain today.    End-stage renal disease on hemodialysis: Continue HD as per schedule.  He has not missed hemodialysis.   Nephrology on board.  Dialysis planned for tomorrow..   Essential hypertension Stable.  Continue amlodipine.   Diabetes mellitus type 2 HbA1c in July was 8.2.  Repeat hemoglobin A1c 5.6.    At home he was taking Lantus 32 units and sliding scale. He was hypoglycemic last night.  Reduce Semglee to 20 units and sliding scale. Continue to monitor FS.  History of stroke: Continue aspirin.   Chronic systolic CHF LVEF is 45 to 50%. Recently admitted for volume overload which corrected after hemodialysis. Appears euvolemic.    DVT prophylaxis: Lovenox Code Status: Full code Family Communication: No family at bed side Disposition Plan:   Status is: Inpatient Remains inpatient appropriate because: Admitted for hyperbilirubinemia.  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  Liver biopsy is planned for this week.  GI is on the board.    Consultants:  Gastroenterology General surgery  Procedures: HIDA scan, MRCP.  Antimicrobials:   Anti-infectives (From admission, onward)    None       Subjective: Patient was seen and examined at bedside.  Overnight events noted.   Patient has vomited last night and reports doing better.   He denies any pain or shortness of breath.  Objective: Vitals:   11/02/22 1625 11/02/22 1957 11/03/22 0307 11/03/22 0752  BP: 118/73 119/68 134/66 110/77  Pulse: 84 86 84 87  Resp: 18 18 19 16   Temp: 98.6 F (37 C) 98.5 F (36.9 C) 98.4 F (  36.9 C) 98.2 F (36.8 C)  TempSrc:  Oral Oral Oral  SpO2: 91% 98% 96% 100%  Weight:       No intake or output data in the 24 hours ending 11/03/22 1210 Filed Weights   11/01/22 1445 11/01/22 1819 11/02/22 0500  Weight: 104.9 kg 103.3 kg 103.5 kg    Examination:  General exam: Appears comfortable, not in any acute distress.  Deconditioned. Respiratory system: CTA bilaterally, respiratory  effort normal RR 15 Cardiovascular system: S1 & S2 heard, regular rate and rhythm, no murmur. Gastrointestinal system: Abdomen is soft, mildly tender, nondistended, BS+ Central nervous system: Alert and oriented X 3. No focal neurological deficits. Extremities:  Right BKA, no edema, no cyanosis, no clubbing. Skin: No rashes, lesions or ulcers Psychiatry: Judgement and insight appear normal. Mood & affect appropriate.     Data Reviewed: I have personally reviewed following labs and imaging studies  CBC: Recent Labs  Lab 10/29/22 1438 10/31/22 0538 11/01/22 1630  WBC 8.8 8.6 7.5  NEUTROABS 4.7  --   --   HGB 10.5* 10.9* 10.0*  HCT 32.2* 31.8* 29.1*  MCV 87.3 84.8 86.6  PLT 349 380 379   Basic Metabolic Panel: Recent Labs  Lab 10/30/22 0728 10/31/22 0538 11/01/22 0435 11/02/22 0221 11/03/22 0333  NA 137 133* 133* 134* 133*  K 5.2* 4.6 5.0 4.2 4.7  CL 93* 89* 91* 91* 91*  CO2 30 29 29 30 29   GLUCOSE 126* 225* 220* 91 71  BUN 38* 28* 37* 26* 34*  CREATININE 9.28* 7.00* 9.09* 7.34* 9.24*  CALCIUM 9.5 9.4 9.6 9.6 9.9  PHOS  --  4.7*  --   --   --    GFR: Estimated Creatinine Clearance: 10.7 mL/min (A) (by C-G formula based on SCr of 9.24 mg/dL (H)). Liver Function Tests: Recent Labs  Lab 10/30/22 0728 10/31/22 0531 10/31/22 0538 11/01/22 0435 11/02/22 0221 11/03/22 0333  AST 43*  --  37 35 44* 38  ALT 34  --  35 32 32 31  ALKPHOS 287*  --  300* 257* 293* 279*  BILITOT 7.3* 7.4* 7.5* 7.7* 7.4* 7.6*  PROT 8.7*  --  8.3* 8.3* 8.6* 8.7*  ALBUMIN 2.4*  --  2.3* 2.4* 2.4* 2.4*   Recent Labs  Lab 10/29/22 1935  LIPASE 22   Recent Labs  Lab 10/29/22 1438  AMMONIA 78*   Coagulation Profile: Recent Labs  Lab 10/29/22 1438 11/01/22 0435 11/02/22 0221 11/03/22 0333  INR 1.4* 1.5* 1.4* 1.4*   Cardiac Enzymes: No results for input(s): "CKTOTAL", "CKMB", "CKMBINDEX", "TROPONINI" in the last 168 hours. BNP (last 3 results) No results for input(s): "PROBNP"  in the last 8760 hours. HbA1C: No results for input(s): "HGBA1C" in the last 72 hours. CBG: Recent Labs  Lab 11/02/22 1624 11/02/22 2055 11/03/22 0748 11/03/22 0906 11/03/22 1151  GLUCAP 93 110* 52* 92 127*   Lipid Profile: No results for input(s): "CHOL", "HDL", "LDLCALC", "TRIG", "CHOLHDL", "LDLDIRECT" in the last 72 hours. Thyroid Function Tests: No results for input(s): "TSH", "T4TOTAL", "FREET4", "T3FREE", "THYROIDAB" in the last 72 hours. Anemia Panel: No results for input(s): "VITAMINB12", "FOLATE", "FERRITIN", "TIBC", "IRON", "RETICCTPCT" in the last 72 hours. Sepsis Labs: No results for input(s): "PROCALCITON", "LATICACIDVEN" in the last 168 hours.  No results found for this or any previous visit (from the past 240 hour(s)).   Radiology Studies: No results found.  Scheduled Meds:  amLODipine  5 mg Oral Daily   Chlorhexidine Gluconate Cloth  6 each Topical Q0600   Chlorhexidine Gluconate Cloth  6 each Topical Q0600   doxercalciferol  4 mcg Intravenous Q T,Th,Sa-HD   enoxaparin (LOVENOX) injection  40 mg Subcutaneous Q24H   insulin aspart  0-5 Units Subcutaneous QHS   insulin aspart  0-9 Units Subcutaneous TID WC   [START ON 11/04/2022] insulin glargine-yfgn  20 Units Subcutaneous Daily   sevelamer carbonate  2,400 mg Oral TID WC   Continuous Infusions:   LOS: 5 days    Time spent: 50 mins    [name] Livolsi, MD [facility]   If 7PM-7AM, please contact night-coverage

## 2022-11-04 LAB — CBC
HCT: 29.8 % — ABNORMAL LOW (ref 39.0–52.0)
Hemoglobin: 10.3 g/dL — ABNORMAL LOW (ref 13.0–17.0)
MCH: 30.3 pg (ref 26.0–34.0)
MCHC: 34.6 g/dL (ref 30.0–36.0)
MCV: 87.6 fL (ref 80.0–100.0)
Platelets: 436 10*3/uL — ABNORMAL HIGH (ref 150–400)
RBC: 3.4 MIL/uL — ABNORMAL LOW (ref 4.22–5.81)
RDW: 22.7 % — ABNORMAL HIGH (ref 11.5–15.5)
WBC: 8.1 10*3/uL (ref 4.0–10.5)
nRBC: 0.2 % (ref 0.0–0.2)

## 2022-11-04 LAB — HEPATITIS B SURFACE ANTIBODY, QUANTITATIVE: Hep B S AB Quant (Post): 7.9 m[IU]/mL — ABNORMAL LOW (ref >9.9)

## 2022-11-04 LAB — RENAL FUNCTION PANEL
Albumin: 2.2 g/dL — ABNORMAL LOW (ref 3.5–5.0)
Anion gap: 14 (ref 5–15)
BUN: 43 mg/dL — ABNORMAL HIGH (ref 6–20)
CO2: 27 mmol/L (ref 22–32)
Calcium: 9.4 mg/dL (ref 8.9–10.3)
Chloride: 91 mmol/L — ABNORMAL LOW (ref 98–111)
Creatinine, Ser: 10.82 mg/dL — ABNORMAL HIGH (ref 0.61–1.24)
GFR, Estimated: 5 mL/min — ABNORMAL LOW (ref >60)
Glucose, Bld: 140 mg/dL — ABNORMAL HIGH (ref 70–99)
Phosphorus: 6.2 mg/dL — ABNORMAL HIGH (ref 2.5–4.6)
Potassium: 4.8 mmol/L (ref 3.5–5.1)
Sodium: 132 mmol/L — ABNORMAL LOW (ref 135–145)

## 2022-11-04 LAB — IGG 4: IgG, Subclass 4: 109 mg/dL — ABNORMAL HIGH (ref 2–96)

## 2022-11-04 LAB — GLUCOSE, CAPILLARY
Glucose-Capillary: 139 mg/dL — ABNORMAL HIGH (ref 70–99)
Glucose-Capillary: 73 mg/dL (ref 70–99)
Glucose-Capillary: 149 mg/dL — ABNORMAL HIGH (ref 70–99)

## 2022-11-04 MED ORDER — HEPARIN SODIUM (PORCINE) 1000 UNIT/ML DIALYSIS: 6000.0000 [IU] | Freq: Once | INTRAMUSCULAR | Status: DC

## 2022-11-04 MED ORDER — GUAIFENESIN 100 MG/5ML PO LIQD
5.0000 mL | ORAL | Status: DC | PRN
  Administered 2022-11-04 – 2022-11-08 (×3): 5 mL via ORAL
  Filled 2022-11-04: qty 15
  Filled 2022-11-04: qty 5
  Filled 2022-11-04: qty 15
  Filled 2022-11-04: qty 5
  Filled 2022-11-04: qty 15

## 2022-11-04 MED ORDER — HEPARIN SODIUM (PORCINE) 1000 UNIT/ML IJ SOLN
INTRAMUSCULAR | Status: AC
  Administered 2022-11-04: 1000 [IU]
  Filled 2022-11-04: qty 6

## 2022-11-04 NOTE — Procedures (Signed)
I was present at this dialysis session. I have reviewed the session itself and made appropriate changes.   Vital signs in last 24 hours:  Temp:  [97.7 F (36.5 C)-98.6 F (37 C)] 97.7 F (36.5 C) (12/19 0845) Pulse Rate:  [80-86] 80 (12/19 0932) Resp:  [15-17] 16 (12/19 0932) BP: (109-119)/(65-75) 112/66 (12/19 0932) SpO2:  [90 %-99 %] 90 % (12/19 0932) Weight:  [103.3 kg] 103.3 kg (12/19 0845) Weight change:  Filed Weights   11/01/22 1819 11/02/22 0500 11/04/22 0845  Weight: 103.3 kg 103.5 kg 103.3 kg    Recent Labs  Lab 10/31/22 0538 11/01/22 0435 11/03/22 0333  NA 133*   < > 133*  K 4.6   < > 4.7  CL 89*   < > 91*  CO2 29   < > 29  GLUCOSE 225*   < > 71  BUN 28*   < > 34*  CREATININE 7.00*   < > 9.24*  CALCIUM 9.4   < > 9.9  PHOS 4.7*  --   --    < > = values in this interval not displayed.    Recent Labs  Lab 10/29/22 1438 10/31/22 0538 11/01/22 1630 11/04/22 0913  WBC 8.8 8.6 7.5 8.1  NEUTROABS 4.7  --   --   --   HGB 10.5* 10.9* 10.0* 10.3*  HCT 32.2* 31.8* 29.1* 29.8*  MCV 87.3 84.8 86.6 87.6  PLT 349 380 374 436*    Scheduled Meds:  amLODipine  5 mg Oral Daily   Chlorhexidine Gluconate Cloth  6 each Topical Q0600   Chlorhexidine Gluconate Cloth  6 each Topical Q0600   doxercalciferol  4 mcg Intravenous Q T,Th,Sa-HD   enoxaparin (LOVENOX) injection  30 mg Subcutaneous Q24H   insulin aspart  0-5 Units Subcutaneous QHS   insulin aspart  0-9 Units Subcutaneous TID WC   insulin glargine-yfgn  20 Units Subcutaneous Daily   sevelamer carbonate  2,400 mg Oral TID WC   Continuous Infusions: PRN Meds:.acetaminophen, diphenhydrAMINE, morphine injection   Donetta Potts,  MD 11/04/2022, 9:45 AM

## 2022-11-04 NOTE — Progress Notes (Signed)
PROGRESS NOTE    [name] Cruel Sr.  IOX:[id number] DOB: 1961/09/10 DOA: 10/29/2022 PCP: [place], Betty G, MD   Brief Narrative:  This 61 y.o. male with PMH significant of ESRD on HD TTS, HFrEF, T2DM, PVD s/p right BKA, HTN who presented with c/o: neck pain, visual aura, abdominal pain. Patient reports having epigastric and left upper quadrant abdominal pain for the past 3 weeks. He also reports intermittent nausea, vomiting and diarrhea.  Reportedly already had ultrasound outpatient about 3 weeks ago showing gallstones and was getting workup outpatient. RUQ ultrasound showed cholelithiasis but no positive sonographic Murphy sign. No wall thickening to suggest cholecystitis. He was evaluated by general surgery who signed off.  GI is following for hyperbilirubinemia and liver disease. Plan: Liver biopsy this week. Aspirin on hold.  Assessment & Plan:   Principal Problem:   Hyperbilirubinemia Active Problems:   ESRD (end stage renal disease) (HCC)   HTN (hypertension)   DM2 (diabetes mellitus, type 2) (HCC)   History of CVA    Hyperkalemia   Cholelithiasis   RUQ abdominal pain  Abdominal pain with hyperbilirubinemia: MRCP does not suggest choledocholithiasis.   No evidence for cholecystitis / HIDA scan negative.  Gallstones were seen. Alkaline phosphatase is mildly elevated.  Bilirubin is significantly elevated majority of it being direct.   Noted to have mildly elevated bilirubin in July but it was normal back in April. Etiology is unclear, bilirubin is still rising. Hepatitis panel unremarkable. Gastroenterology consulted.  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   Plan is for liver biopsy this week, aspirin is on hold.   Total bilirubin is 7.6 yesterday.  Follow-up biopsy.     Visual aura/ Neck pain: Patient denies these symptoms in the past. He had a stroke earlier this year.  No history of seizures and migraine headaches.   CT head as well as EEG negative.   He denies any neck pain today.   End-stage renal disease on hemodialysis: Continue HD as per schedule.  He has not missed hemodialysis.   Nephrology on board.  Dialysis planned for today.   Essential hypertension Stable.  Continue amlodipine.   Diabetes mellitus type 2 HbA1c in July was 8.2.  Repeat hemoglobin A1c 5.6.    At home he was taking Lantus 32 units and sliding scale. He was hypoglycemic last night.  Reduce Semglee to 20 units and sliding scale. Continue to monitor FS.  History of stroke: Continue aspirin.   Chronic systolic CHF LVEF is 45 to 50%. Recently admitted for volume overload which corrected after hemodialysis. Appears euvolemic.    DVT prophylaxis: Lovenox Code Status: Full code Family Communication: No family at bed side Disposition Plan:   Status is: Inpatient Remains inpatient appropriate because: Admitted for hyperbilirubinemia.  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  Liver biopsy is planned for this week.  GI is on the board.    Consultants:  Gastroenterology General surgery  Procedures: HIDA scan, MRCP.  Antimicrobials:   Anti-infectives (From admission, onward)    None       Subjective: Patient was seen and examined at hemodialysis.  Overnight events noted. Patient reports doing better, denies any nausea and vomiting. He denies any pain or shortness of breath.  Objective: Vitals:   11/04/22 1028 11/04/22 1030 11/04/22 1100 11/04/22 1130  BP: (!) 88/68 97/63 109/60 106/62  Pulse: 77 79 78 81  Resp: (!) 27 14 (!) 24 (!) 29  Temp:      TempSrc:  SpO2:      Weight:        Intake/Output Summary (Last 24 hours) at 11/04/2022 1144 Last data filed at 11/03/2022 1418 Gross per 24 hour  Intake 236 ml  Output --  Net 236 ml   Filed Weights   11/01/22 1819 11/02/22 0500 11/04/22 0845  Weight: 103.3 kg 103.5 kg 103.3 kg    Examination:  General exam: Appears comfortable, not in any acute distress,  deconditioned. Respiratory system: CTA bilaterally, respiratory effort normal RR 15 Cardiovascular system: S1 & S2 heard, regular rate and rhythm, no murmur. Gastrointestinal system: Abdomen is soft, mildly tender, nondistended, BS+ Central nervous system: Alert and oriented X 3. No focal neurological deficits. Extremities:  Right BKA, no edema, no cyanosis, no clubbing. Skin: No rashes, lesions or ulcers Psychiatry: Judgement and insight appear normal. Mood & affect appropriate.     Data Reviewed: I have personally reviewed following labs and imaging studies  CBC: Recent Labs  Lab 10/29/22 1438 10/31/22 0538 11/01/22 1630 11/04/22 0913  WBC 8.8 8.6 7.5 8.1  NEUTROABS 4.7  --   --   --   HGB 10.5* 10.9* 10.0* 10.3*  HCT 32.2* 31.8* 29.1* 29.8*  MCV 87.3 84.8 86.6 87.6  PLT 349 380 374 580*   Basic Metabolic Panel: Recent Labs  Lab 10/31/22 0538 11/01/22 0435 11/02/22 0221 11/03/22 0333 11/04/22 0903  NA 133* 133* 134* 133* 132*  K 4.6 5.0 4.2 4.7 4.8  CL 89* 91* 91* 91* 91*  CO2 29 29 30 29 27   GLUCOSE 225* 220* 91 71 140*  BUN 28* 37* 26* 34* 43*  CREATININE 7.00* 9.09* 7.34* 9.24* 10.82*  CALCIUM 9.4 9.6 9.6 9.9 9.4  PHOS 4.7*  --   --   --  6.2*   GFR: Estimated Creatinine Clearance: 9.2 mL/min (A) (by C-G formula based on SCr of 10.82 mg/dL (H)). Liver Function Tests: Recent Labs  Lab 10/30/22 0728 10/31/22 0531 10/31/22 0538 11/01/22 0435 11/02/22 0221 11/03/22 0333 11/04/22 0903  AST 43*  --  37 35 44* 38  --   ALT 34  --  35 32 32 31  --   ALKPHOS 287*  --  300* 257* 293* 279*  --   BILITOT 7.3* 7.4* 7.5* 7.7* 7.4* 7.6*  --   PROT 8.7*  --  8.3* 8.3* 8.6* 8.7*  --   ALBUMIN 2.4*  --  2.3* 2.4* 2.4* 2.4* 2.2*   Recent Labs  Lab 10/29/22 1935  LIPASE 22   Recent Labs  Lab 10/29/22 1438  AMMONIA 78*   Coagulation Profile: Recent Labs  Lab 10/29/22 1438 11/01/22 0435 11/02/22 0221 11/03/22 0333  INR 1.4* 1.5* 1.4* 1.4*   Cardiac  Enzymes: No results for input(s): "CKTOTAL", "CKMB", "CKMBINDEX", "TROPONINI" in the last 168 hours. BNP (last 3 results) No results for input(s): "PROBNP" in the last 8760 hours. HbA1C: No results for input(s): "HGBA1C" in the last 72 hours. CBG: Recent Labs  Lab 11/03/22 0906 11/03/22 1151 11/03/22 1726 11/03/22 2020 11/04/22 0729  GLUCAP 92 127* 121* 128* 139*   Lipid Profile: No results for input(s): "CHOL", "HDL", "LDLCALC", "TRIG", "CHOLHDL", "LDLDIRECT" in the last 72 hours. Thyroid Function Tests: No results for input(s): "TSH", "T4TOTAL", "FREET4", "T3FREE", "THYROIDAB" in the last 72 hours. Anemia Panel: No results for input(s): "VITAMINB12", "FOLATE", "FERRITIN", "TIBC", "IRON", "RETICCTPCT" in the last 72 hours. Sepsis Labs: No results for input(s): "PROCALCITON", "LATICACIDVEN" in the last 168 hours.  No results found for  this or any previous visit (from the past 240 hour(s)).   Radiology Studies: No results found.  Scheduled Meds:  amLODipine  5 mg Oral Daily   Chlorhexidine Gluconate Cloth  6 each Topical Q0600   Chlorhexidine Gluconate Cloth  6 each Topical Q0600   doxercalciferol  4 mcg Intravenous Q T,Th,Sa-HD   enoxaparin (LOVENOX) injection  30 mg Subcutaneous Q24H   heparin  6,000 Units Dialysis Once in dialysis   insulin aspart  0-5 Units Subcutaneous QHS   insulin aspart  0-9 Units Subcutaneous TID WC   insulin glargine-yfgn  20 Units Subcutaneous Daily   sevelamer carbonate  2,400 mg Oral TID WC   Continuous Infusions:   LOS: 6 days    Time spent: 35 mins    Terrin Imparato, MD [facility]   If 7PM-7AM, please contact night-coverage

## 2022-11-04 NOTE — Progress Notes (Signed)
Taken from his home unit going to HD.Alert ,oriented x 4. Vitals stable.  Access used : left upper arm fistula ,two big aneurism noted.  Duration of treatment:3.5 hours.  Medicines given Hectorol 31mcg                            Heparin 6,000 cc bolus.  Fluid removed : 1liter  Hemodialysis issue : Unable to meet prescribed fluid goal due to his blood pressure issue.  Hands off to the patient's nurse.

## 2022-11-05 LAB — COMPREHENSIVE METABOLIC PANEL
ALT: 30 U/L (ref 0–44)
AST: 41 U/L (ref 15–41)
Albumin: 2.2 g/dL — ABNORMAL LOW (ref 3.5–5.0)
Alkaline Phosphatase: 246 U/L — ABNORMAL HIGH (ref 38–126)
Anion gap: 13 (ref 5–15)
BUN: 27 mg/dL — ABNORMAL HIGH (ref 6–20)
CO2: 29 mmol/L (ref 22–32)
Calcium: 9.5 mg/dL (ref 8.9–10.3)
Chloride: 90 mmol/L — ABNORMAL LOW (ref 98–111)
Creatinine, Ser: 7.69 mg/dL — ABNORMAL HIGH (ref 0.61–1.24)
GFR, Estimated: 7 mL/min — ABNORMAL LOW (ref >60)
Glucose, Bld: 151 mg/dL — ABNORMAL HIGH (ref 70–99)
Potassium: 4.5 mmol/L (ref 3.5–5.1)
Sodium: 132 mmol/L — ABNORMAL LOW (ref 135–145)
Total Bilirubin: 6.9 mg/dL — ABNORMAL HIGH (ref 0.3–1.2)
Total Protein: 8.4 g/dL — ABNORMAL HIGH (ref 6.5–8.1)

## 2022-11-05 LAB — GLUCOSE, CAPILLARY
Glucose-Capillary: 160 mg/dL — ABNORMAL HIGH (ref 70–99)
Glucose-Capillary: 132 mg/dL — ABNORMAL HIGH (ref 70–99)
Glucose-Capillary: 121 mg/dL — ABNORMAL HIGH (ref 70–99)
Glucose-Capillary: 104 mg/dL — ABNORMAL HIGH (ref 70–99)

## 2022-11-05 LAB — PROTIME-INR
INR: 1.5 — ABNORMAL HIGH (ref 0.8–1.2)
Prothrombin Time: 17.9 seconds — ABNORMAL HIGH (ref 11.4–15.2)

## 2022-11-05 LAB — CBC
HCT: 31.6 % — ABNORMAL LOW (ref 39.0–52.0)
Hemoglobin: 10.5 g/dL — ABNORMAL LOW (ref 13.0–17.0)
MCH: 29.7 pg (ref 26.0–34.0)
MCHC: 33.2 g/dL (ref 30.0–36.0)
MCV: 89.3 fL (ref 80.0–100.0)
Platelets: 439 10*3/uL — ABNORMAL HIGH (ref 150–400)
RBC: 3.54 MIL/uL — ABNORMAL LOW (ref 4.22–5.81)
RDW: 22.4 % — ABNORMAL HIGH (ref 11.5–15.5)
WBC: 8.2 10*3/uL (ref 4.0–10.5)
nRBC: 0.4 % — ABNORMAL HIGH (ref 0.0–0.2)

## 2022-11-05 MED ORDER — ENOXAPARIN SODIUM 30 MG/0.3ML IJ SOSY
30.0000 mg | PREFILLED_SYRINGE | INTRAMUSCULAR | Status: DC
  Administered 2022-11-08: 30 mg via SUBCUTANEOUS
  Filled 2022-11-05: qty 0.3

## 2022-11-05 NOTE — TOC Initial Note (Signed)
Transition of Care (TOC) - Initial/Assessment Note    Patient Details  Name: [name] HARSHBERGER Sr. MRN: [id number] Date of Birth: 1961/06/03  [facility] CM/SW Contact:    Curlene Labrum, RN Phone Number: 11/05/2022, 3:23 PM  Clinical Narrative:                 CM met with the patient at the bedside.  The patient is pending scheduled Liver biopsy this week.  [facility] is holding the patient bed at the facility.  Once the patient is medically stable for discharge - he can be discharged back to [facility] for continued care.  CM and MSW will continue to follow the patient for TOC needs.  Planned Disposition: Skilled Nursing Facility Barriers to Discharge: Continued Medical Work up   Patient Goals and CMS Choice   CMS Medicare.gov Compare Post Acute Care list provided to:: Patient Choice offered to / list presented to : Patient [place] ownership interest in [facility].provided to:: Patient    Expected Discharge Plan and Services Planned Disposition: [place]   Discharge Planning Services: CM Consult Post Acute Care Choice: Resumption of Svcs/PTA Provider (Return to [facility] LTC facility once medically stable for discharge) Living arrangements for the past 2 months: [place]                                      Prior Living Arrangements/Services Living arrangements for the past 2 months: [place] Lives with:: Facility Resident Patient language and need for interpreter reviewed:: Yes Do you feel safe going back to the place where you live?: Yes      Need for Family Participation in Patient Care: Yes (Comment) Care giver support system in place?: Yes (comment)   Criminal Activity/Legal Involvement Pertinent to Current Situation/Hospitalization: No - Comment as needed  Activities of Daily Living Home Assistive Devices/Equipment: Wheelchair ADL Screening (condition at  time of admission) Patient's cognitive ability adequate to safely complete daily activities?: Yes Is the patient deaf or have difficulty hearing?: No Does the patient have difficulty seeing, even when wearing glasses/contacts?: No Does the patient have difficulty concentrating, remembering, or making decisions?: No Patient able to express need for assistance with ADLs?: Yes Does the patient have difficulty dressing or bathing?: Yes Independently performs ADLs?: No Communication: Independent Dressing (OT): Needs assistance Is this a change from baseline?: Pre-admission baseline Grooming: Independent Feeding: Independent Bathing: Needs assistance Is this a change from baseline?: Pre-admission baseline Toileting: Needs assistance Is this a change from baseline?: Pre-admission baseline In/Out Bed: Needs assistance Is this a change from baseline?: Pre-admission baseline Walks in Home: Needs assistance Is this a change from baseline?: Pre-admission baseline Does the patient have difficulty walking or climbing stairs?: Yes Weakness of Legs: Both Weakness of Arms/Hands: None  Permission Sought/Granted Permission sought to share information with : Case Manager Permission granted to share information with : Yes, Verbal Permission Granted     Permission granted to share info w AGENCY: [facility] - holding bed for the patient        Emotional Assessment Appearance:: Appears stated age Attitude/Demeanor/Rapport: Gracious Affect (typically observed): Accepting Orientation: : Oriented to Self, Oriented to Place, Oriented to  Time, Oriented to Situation Alcohol / Substance Use: Not Applicable Psych Involvement: No (comment)  Admission diagnosis:  RUQ abdominal pain [R10.11] Cholelithiasis [K80.20] Patient Active  Problem List   Diagnosis Date Noted   RUQ abdominal pain 10/30/2022   Cholelithiasis 10/29/2022   Hyperbilirubinemia 10/29/2022   Volume overload 10/19/2022    CHF (congestive heart failure) ([place]) 10/18/2022   Hyperosmolar hyperglycemic state (HHS) ([place])    Diarrhea    Dehiscence of amputation stump of right lower extremity (HCC)    Abscess of right lower leg    Pressure injury of skin 07/02/2022   Cellulitis of right leg 07/01/2022   CAD (coronary artery disease) 05/24/2022   Leukocytosis 05/24/2022   Below-knee amputation of right lower extremity ([place]) 05/23/2022   Obesity (BMI 30-39.9) 02/19/2022   History of CVA  02/05/2022   Carotid stenosis 02/05/2022   Hyperkalemia 02/03/2022   acute on chronic Anemia of chronic renal failure  02/03/2022   Subacute osteomyelitis, right ankle and foot ([place]) 63/87/5643   Acute metabolic encephalopathy 32/95/1884   Protein-calorie malnutrition, mild ([place]) 02/02/2022   GERD (gastroesophageal reflux disease) 02/02/2022   Acute on chronic systolic CHF (congestive heart failure) ([place]) 08/14/2019   LBBB (left bundle branch block) 08/13/2019   Problem with vascular access 08/13/2019   Dialysis AV fistula malfunction ([place]) 08/12/2019   ESRD (end stage renal disease) ([place]) 09/20/2018   DM2 (diabetes mellitus, type 2) ([place]) 09/20/2018   HTN (hypertension) 09/20/2018   PCP:  [place], Betty G, MD Pharmacy:  No Pharmacies Listed    Social Determinants of Health (SDOH) Social History: SDOH Screenings   Food Insecurity: No Food Insecurity (10/29/2022)  Housing: Low Risk  (10/29/2022)  Transportation Needs: No Transportation Needs (10/29/2022)  Utilities: Not At Risk (10/29/2022)  Alcohol Screen: Low Risk  (02/04/2022)  Depression (PHQ2-9): Low Risk  (08/06/2022)  Tobacco Use: Low Risk  (10/29/2022)   SDOH Interventions:     Readmission Risk Interventions    11/05/2022    3:08 PM  Readmission Risk Prevention Plan  Transportation Screening Complete  Medication Review (RN Care Manager) Complete  PCP or Specialist appointment within 3-5 days of discharge Complete  HRI or Home Care Consult Complete   SW Recovery Care/Counseling Consult Complete  Palliative Care Screening Complete  Skilled Nursing Facility Complete

## 2022-11-05 NOTE — Progress Notes (Signed)
Patient ID: [name] Cruel Sr., male   DOB: 08/10/1961, 61 y.o.   MRN: [id number] S: Seen in his room.  No new complaints. O:BP 132/72 (BP Location: Right Arm)   Pulse 88   Temp 98.4 F (36.9 C) (Oral)   Resp 16   Wt 102.1 kg   SpO2 (!) 87%   BMI 29.70 kg/m   Intake/Output Summary (Last 24 hours) at 11/05/2022 0959 Last data filed at 11/04/2022 1234 Gross per 24 hour  Intake --  Output 1000 ml  Net -1000 ml   Intake/Output: I/O last 3 completed shifts: In: -  Out: 1000 [Other:1000]  Intake/Output this shift:  No intake/output data recorded. Weight change:  Gen: NAD CVS: RRR Resp:CTA Abd: +BS, soft, mild tenderness to RUQ, no guarding/rebound Ext: no edema LUE AVF +T/B  Recent Labs  Lab 10/29/22 1438 10/30/22 0728 10/31/22 0538 11/01/22 0435 11/02/22 0221 11/03/22 0333 11/04/22 0903 11/05/22 0243  NA 138 137 133* 133* 134* 133* 132* 132*  K 5.3* 5.2* 4.6 5.0 4.2 4.7 4.8 4.5  CL 96* 93* 89* 91* 91* 91* 91* 90*  CO2 28 30 29 29 30 29 27 29   GLUCOSE 55* 126* 225* 220* 91 71 140* 151*  BUN 34* 38* 28* 37* 26* 34* 43* 27*  CREATININE 8.06* 9.28* 7.00* 9.09* 7.34* 9.24* 10.82* 7.69*  ALBUMIN 2.4* 2.4* 2.3* 2.4* 2.4* 2.4* 2.2* 2.2*  CALCIUM 9.4 9.5 9.4 9.6 9.6 9.9 9.4 9.5  PHOS  --   --  4.7*  --   --   --  6.2*  --   AST 54* 43* 37 35 44* 38  --  41  ALT 36 34 35 32 32 31  --  30   Liver Function Tests: Recent Labs  Lab 11/02/22 0221 11/03/22 0333 11/04/22 0903 11/05/22 0243  AST 44* 38  --  41  ALT 32 31  --  30  ALKPHOS 293* 279*  --  246*  BILITOT 7.4* 7.6*  --  6.9*  PROT 8.6* 8.7*  --  8.4*  ALBUMIN 2.4* 2.4* 2.2* 2.2*   Recent Labs  Lab 10/29/22 1935  LIPASE 22   Recent Labs  Lab 10/29/22 1438  AMMONIA 78*   CBC: Recent Labs  Lab 10/29/22 1438 10/31/22 0538 11/01/22 1630 11/04/22 0913 11/05/22 0243  WBC 8.8 8.6 7.5 8.1 8.2  NEUTROABS 4.7  --   --   --   --   HGB 10.5* 10.9* 10.0* 10.3* 10.5*  HCT 32.2* 31.8* 29.1* 29.8* 31.6*   MCV 87.3 84.8 86.6 87.6 89.3  PLT 349 380 374 436* 439*   Cardiac Enzymes: No results for input(s): "CKTOTAL", "CKMB", "CKMBINDEX", "TROPONINI" in the last 168 hours. CBG: Recent Labs  Lab 11/03/22 2020 11/04/22 0729 11/04/22 1548 11/04/22 1939 11/05/22 0724  GLUCAP 128* 139* 73 149* 160*    Iron Studies: No results for input(s): "IRON", "TIBC", "TRANSFERRIN", "FERRITIN" in the last 72 hours. Studies/Results: No results found.  amLODipine  5 mg Oral Daily   Chlorhexidine Gluconate Cloth  6 each Topical Q0600   Chlorhexidine Gluconate Cloth  6 each Topical Q0600   doxercalciferol  4 mcg Intravenous Q T,Th,Sa-HD   enoxaparin (LOVENOX) injection  30 mg Subcutaneous Q24H   insulin aspart  0-5 Units Subcutaneous QHS   insulin aspart  0-9 Units Subcutaneous TID WC   insulin glargine-yfgn  20 Units Subcutaneous Daily   sevelamer carbonate  2,400 mg Oral TID WC    BMET  Component Value Date/Time   NA 132 (L) 11/05/2022 0243   K 4.5 11/05/2022 0243   CL 90 (L) 11/05/2022 0243   CO2 29 11/05/2022 0243   GLUCOSE 151 (H) 11/05/2022 0243   BUN 27 (H) 11/05/2022 0243   CREATININE 7.69 (H) 11/05/2022 0243   CALCIUM 9.5 11/05/2022 0243   GFRNONAA 7 (L) 11/05/2022 0243   GFRAA 3 (L) 08/14/2019 2051   CBC    Component Value Date/Time   WBC 8.2 11/05/2022 0243   RBC 3.54 (L) 11/05/2022 0243   HGB 10.5 (L) 11/05/2022 0243   HCT 31.6 (L) 11/05/2022 0243   PLT 439 (H) 11/05/2022 0243   MCV 89.3 11/05/2022 0243   MCH 29.7 11/05/2022 0243   MCHC 33.2 11/05/2022 0243   RDW 22.4 (H) 11/05/2022 0243   LYMPHSABS 1.5 10/29/2022 1438   MONOABS 1.9 (H) 10/29/2022 1438   EOSABS 0.5 10/29/2022 1438   BASOSABS 0.1 10/29/2022 1438    OP HD: [place] TTS 4h 50min 450/ 500   104kg   2/2 bath  Heparin 6000  LUA AVF - last HD 12/12, post wt 103.3kg - venofer 100 tiw IV thru 12/19 - hectorol 4 ug IV tiw - no esa, last Hb 11.0     ED afeb, normal BP to soft   Tbili 6.5, AST 54/ ALT 36    RUQ [place] +stones, neg murphy's    Assessment/ Plan: Abd pain/ jaundice - GI consulting. Possible chronic process, evaluating for autoimmune, infection and other causes. HIDA scan negative, MRCP showed gallstone but no evidence of biliary tract obstruction.  For liver biopsy after aspirin held for 1 week. ESRD - on HD TTS. Had HD Sat. Next HD 12/19.  HTN/ vol - euvolemic on exam, at dry wt. 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 Follow.  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 Cont renvela 3 tid ac. Cont IV vdra.  H/o CVA DM2 Debility - chronic, lives at [place], MD [facility]

## 2022-11-05 NOTE — Progress Notes (Signed)
PROGRESS NOTE    [name] Cruel Sr.  VWU:[id number] DOB: Aug 04, 1961 DOA: 10/29/2022 PCP: [place], Betty G, MD   Brief Narrative:  This 61 y.o. male with PMH significant of ESRD on HD TTS, HFrEF, T2DM, PVD s/p right BKA, HTN who presented with c/o: neck pain, visual aura, abdominal pain. Patient reports having epigastric and left upper quadrant abdominal pain for the past 3 weeks. He also reports intermittent nausea, vomiting and diarrhea.  Reportedly already had ultrasound outpatient about 3 weeks ago showing gallstones and was getting workup outpatient. RUQ ultrasound showed cholelithiasis but no positive sonographic Murphy sign. No wall thickening to suggest cholecystitis. He was evaluated by general surgery who signed off.  GI is following for hyperbilirubinemia and liver disease. Plan: Liver biopsy this week. Aspirin on hold.  Assessment & Plan:   Principal Problem:   Hyperbilirubinemia Active Problems:   ESRD (end stage renal disease) (HCC)   HTN (hypertension)   DM2 (diabetes mellitus, type 2) (HCC)   History of CVA    Hyperkalemia   Cholelithiasis   RUQ abdominal pain  Abdominal pain with hyperbilirubinemia: MRCP does not suggest choledocholithiasis.   No evidence for cholecystitis / HIDA scan negative.  Gallstones were seen. Alkaline phosphatase is mildly elevated.  Bilirubin is significantly elevated majority of it being direct.   Noted to have mildly elevated bilirubin in July but it was normal back in April. Etiology is unclear, bilirubin is still rising. Hepatitis panel unremarkable. Gastroenterology consulted.  Extensive workup completed. Plan is for liver biopsy this week, aspirin is on hold.   Bilirubin is improving.  Liver biopsy scheduled for 11/06/2022.  GI will follow-up after that.   Visual aura/ Neck pain: Patient denies these symptoms in the past. He had a stroke earlier this year.  No history of seizures and migraine headaches.   CT head as well as EEG  negative.  He has not had any complaint since several days now.   End-stage renal disease on hemodialysis: Continue HD as per schedule.  He has not missed hemodialysis.   Nephrology on board.  Dialysis per them.   Essential hypertension Stable.  Continue amlodipine.   Diabetes mellitus type 2 HbA1c in July was 8.2.  Repeat hemoglobin A1c 5.6.    At home he was taking Lantus 32 units and sliding scale.  Currently on 20 units of Lantus and SSI, blood sugar controlled.  History of stroke: Continue aspirin.   Chronic systolic CHF LVEF is 45 to 50%. Recently admitted for volume overload which corrected after hemodialysis. Appears euvolemic.    DVT prophylaxis: Lovenox Code Status: Full code Family Communication: No family at bed side Disposition Plan:   Status is: Inpatient Remains inpatient appropriate because: Admitted for hyperbilirubinemia.  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  Liver biopsy is planned for this week.  GI is on the board.    Consultants:  Gastroenterology General surgery  Procedures: HIDA scan, MRCP.  Antimicrobials:   Anti-infectives (From admission, onward)    None       Subjective:  Patient seen and examined.  Sleepy but arousable.  Oriented.  No complaints.  Objective: Vitals:   11/04/22 1602 11/04/22 2048 11/05/22 0354 11/05/22 0727  BP: 114/76 118/65 131/70 132/72  Pulse: 83 84 87 88  Resp: 15   16  Temp: 98.2 F (36.8 C) 98 F (36.7 C) (!) 97.3 F (36.3 C) 98.4 F (36.9 C)  TempSrc: Oral  Oral Oral  SpO2: 90% 90% 94% Marland Kitchen)  87%  Weight:        Intake/Output Summary (Last 24 hours) at 11/05/2022 0827 Last data filed at 11/04/2022 1234 Gross per 24 hour  Intake --  Output 1000 ml  Net -1000 ml    Filed Weights   11/02/22 0500 11/04/22 0845 11/04/22 1259  Weight: 103.5 kg 103.3 kg 102.1 kg    Examination:  General exam: Appears calm and comfortable  Respiratory system: Clear to auscultation. Respiratory effort  normal. Cardiovascular system: S1 & S2 heard, RRR. No JVD, murmurs, rubs, gallops or clicks. No pedal edema. Gastrointestinal system: Abdomen is nondistended, soft and mild right upper quadrant tenderness. No organomegaly or masses felt. Normal bowel sounds heard. Central nervous system: Alert and oriented. No focal neurological deficits. Extremities: Symmetric 5 x 5 power. Skin: No rashes, lesions or ulcers.  Psychiatry: Judgement and insight appear normal. Mood & affect appropriate.   Data Reviewed: I have personally reviewed following labs and imaging studies  CBC: Recent Labs  Lab 10/29/22 1438 10/31/22 0538 11/01/22 1630 11/04/22 0913 11/05/22 0243  WBC 8.8 8.6 7.5 8.1 8.2  NEUTROABS 4.7  --   --   --   --   HGB 10.5* 10.9* 10.0* 10.3* 10.5*  HCT 32.2* 31.8* 29.1* 29.8* 31.6*  MCV 87.3 84.8 86.6 87.6 89.3  PLT 349 380 374 436* 439*    Basic Metabolic Panel: Recent Labs  Lab 10/31/22 0538 11/01/22 0435 11/02/22 0221 11/03/22 0333 11/04/22 0903 11/05/22 0243  NA 133* 133* 134* 133* 132* 132*  K 4.6 5.0 4.2 4.7 4.8 4.5  CL 89* 91* 91* 91* 91* 90*  CO2 29 29 30 29 27 29   GLUCOSE 225* 220* 91 71 140* 151*  BUN 28* 37* 26* 34* 43* 27*  CREATININE 7.00* 9.09* 7.34* 9.24* 10.82* 7.69*  CALCIUM 9.4 9.6 9.6 9.9 9.4 9.5  PHOS 4.7*  --   --   --  6.2*  --     GFR: Estimated Creatinine Clearance: 12.8 mL/min (A) (by C-G formula based on SCr of 7.69 mg/dL (H)). Liver Function Tests: Recent Labs  Lab 10/31/22 0538 11/01/22 0435 11/02/22 0221 11/03/22 0333 11/04/22 0903 11/05/22 0243  AST 37 35 44* 38  --  41  ALT 35 32 32 31  --  30  ALKPHOS 300* 257* 293* 279*  --  246*  BILITOT 7.5* 7.7* 7.4* 7.6*  --  6.9*  PROT 8.3* 8.3* 8.6* 8.7*  --  8.4*  ALBUMIN 2.3* 2.4* 2.4* 2.4* 2.2* 2.2*    Recent Labs  Lab 10/29/22 1935  LIPASE 22    Recent Labs  Lab 10/29/22 1438  AMMONIA 78*    Coagulation Profile: Recent Labs  Lab 10/29/22 1438 11/01/22 0435  11/02/22 0221 11/03/22 0333 11/05/22 0243  INR 1.4* 1.5* 1.4* 1.4* 1.5*    Cardiac Enzymes: No results for input(s): "CKTOTAL", "CKMB", "CKMBINDEX", "TROPONINI" in the last 168 hours. BNP (last 3 results) No results for input(s): "PROBNP" in the last 8760 hours. HbA1C: No results for input(s): "HGBA1C" in the last 72 hours. CBG: Recent Labs  Lab 11/03/22 2020 11/04/22 0729 11/04/22 1548 11/04/22 1939 11/05/22 0724  GLUCAP 128* 139* 73 149* 160*    Lipid Profile: No results for input(s): "CHOL", "HDL", "LDLCALC", "TRIG", "CHOLHDL", "LDLDIRECT" in the last 72 hours. Thyroid Function Tests: No results for input(s): "TSH", "T4TOTAL", "FREET4", "T3FREE", "THYROIDAB" in the last 72 hours. Anemia Panel: No results for input(s): "VITAMINB12", "FOLATE", "FERRITIN", "TIBC", "IRON", "RETICCTPCT" in the last 72 hours.  Sepsis Labs: No results for input(s): "PROCALCITON", "LATICACIDVEN" in the last 168 hours.  No results found for this or any previous visit (from the past 240 hour(s)).   Radiology Studies: No results found.  Scheduled Meds:  amLODipine  5 mg Oral Daily   Chlorhexidine Gluconate Cloth  6 each Topical Q0600   Chlorhexidine Gluconate Cloth  6 each Topical Q0600   doxercalciferol  4 mcg Intravenous Q T,Th,Sa-HD   enoxaparin (LOVENOX) injection  30 mg Subcutaneous Q24H   insulin aspart  0-5 Units Subcutaneous QHS   insulin aspart  0-9 Units Subcutaneous TID WC   insulin glargine-yfgn  20 Units Subcutaneous Daily   sevelamer carbonate  2,400 mg Oral TID WC   Continuous Infusions:   LOS: 7 days    Time spent: 35 mins    Darliss Cheney, MD [facility]   If 7PM-7AM, please contact night-coverage

## 2022-11-06 LAB — GLUCOSE, CAPILLARY
Glucose-Capillary: 98 mg/dL (ref 70–99)
Glucose-Capillary: 111 mg/dL — ABNORMAL HIGH (ref 70–99)
Glucose-Capillary: 111 mg/dL — ABNORMAL HIGH (ref 70–99)

## 2022-11-06 LAB — COMPREHENSIVE METABOLIC PANEL
ALT: 30 U/L (ref 0–44)
AST: 39 U/L (ref 15–41)
Albumin: 2.2 g/dL — ABNORMAL LOW (ref 3.5–5.0)
Alkaline Phosphatase: 227 U/L — ABNORMAL HIGH (ref 38–126)
Anion gap: 13 (ref 5–15)
BUN: 36 mg/dL — ABNORMAL HIGH (ref 6–20)
CO2: 28 mmol/L (ref 22–32)
Calcium: 9.4 mg/dL (ref 8.9–10.3)
Chloride: 91 mmol/L — ABNORMAL LOW (ref 98–111)
Creatinine, Ser: 9.88 mg/dL — ABNORMAL HIGH (ref 0.61–1.24)
GFR, Estimated: 6 mL/min — ABNORMAL LOW (ref >60)
Glucose, Bld: 121 mg/dL — ABNORMAL HIGH (ref 70–99)
Potassium: 4.5 mmol/L (ref 3.5–5.1)
Sodium: 132 mmol/L — ABNORMAL LOW (ref 135–145)
Total Bilirubin: 6.3 mg/dL — ABNORMAL HIGH (ref 0.3–1.2)
Total Protein: 8.3 g/dL — ABNORMAL HIGH (ref 6.5–8.1)

## 2022-11-06 LAB — PROTIME-INR
INR: 1.5 — ABNORMAL HIGH (ref 0.8–1.2)
Prothrombin Time: 17.9 seconds — ABNORMAL HIGH (ref 11.4–15.2)

## 2022-11-06 MED ORDER — DOXERCALCIFEROL 4 MCG/2ML IV SOLN
INTRAVENOUS | Status: AC
  Filled 2022-11-06: qty 2

## 2022-11-06 MED ORDER — HEPARIN SODIUM (PORCINE) 1000 UNIT/ML IJ SOLN
INTRAMUSCULAR | Status: AC
  Administered 2022-11-06: 100 [IU]
  Filled 2022-11-06: qty 5

## 2022-11-06 MED ORDER — DIPHENHYDRAMINE HCL 25 MG PO CAPS
ORAL_CAPSULE | ORAL | Status: AC
  Filled 2022-11-06: qty 1

## 2022-11-06 NOTE — Procedures (Signed)
I was present at this dialysis session. I have reviewed the session itself and made appropriate changes.   Vital signs in last 24 hours:  Temp:  [97.9 F (36.6 C)-98.6 F (37 C)] 98.4 F (36.9 C) (12/21 0845) Pulse Rate:  [84-86] 86 (12/21 0845) Resp:  [14-18] 14 (12/21 0845) BP: (116-131)/(68-76) 131/71 (12/21 0845) SpO2:  [90 %-98 %] 94 % (12/21 0845) Weight:  [101.7 kg] 101.7 kg (12/21 0845) Weight change:  Filed Weights   11/04/22 0845 11/04/22 1259 11/06/22 0845  Weight: 103.3 kg 102.1 kg 101.7 kg    Recent Labs  Lab 11/04/22 0903 11/05/22 0243 11/06/22 0525  NA 132*   < > 132*  K 4.8   < > 4.5  CL 91*   < > 91*  CO2 27   < > 28  GLUCOSE 140*   < > 121*  BUN 43*   < > 36*  CREATININE 10.82*   < > 9.88*  CALCIUM 9.4   < > 9.4  PHOS 6.2*  --   --    < > = values in this interval not displayed.    Recent Labs  Lab 11/01/22 1630 11/04/22 0913 11/05/22 0243  WBC 7.5 8.1 8.2  HGB 10.0* 10.3* 10.5*  HCT 29.1* 29.8* 31.6*  MCV 86.6 87.6 89.3  PLT 374 436* 439*    Scheduled Meds:  amLODipine  5 mg Oral Daily   Chlorhexidine Gluconate Cloth  6 each Topical Q0600   Chlorhexidine Gluconate Cloth  6 each Topical Q0600   doxercalciferol  4 mcg Intravenous Q T,Th,Sa-HD   [START ON 11/08/2022] enoxaparin (LOVENOX) injection  30 mg Subcutaneous Q24H   insulin aspart  0-5 Units Subcutaneous QHS   insulin aspart  0-9 Units Subcutaneous TID WC   insulin glargine-yfgn  20 Units Subcutaneous Daily   sevelamer carbonate  2,400 mg Oral TID WC   Continuous Infusions: PRN Meds:.acetaminophen, diphenhydrAMINE, guaiFENesin, morphine injection   Donetta Potts,  MD 11/06/2022, 8:58 AM

## 2022-11-06 NOTE — Consult Note (Addendum)
Chief Complaint: Patient was seen in consultation today for abnormal liver enzymes  Referring Physician(s): [place] Cellar, MD  Supervising Physician: Corrie Mckusick  Patient Status: [name] [name] - In-pt  History of Present Illness: LETROY VAZGUEZ Sr. is a 61 y.o. 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 The patient was admitted to [facility] on 10/29/22 with a 3 week history of epigastric and LUQ abdominal pain. Ultrasound revealed the presence of cholelithiasis, but the patient did not have positive Murphy sign nor did he have wall thickening to indicate cholecystitis. The patient has had abnormal liver enzymes on bloodwork while in the hospital. IR has been consulted to evaluate the patient for an image-guided liver biopsy.  Past Medical History:  Diagnosis Date   CHF (congestive heart failure) ([place])    ECHO 02/05/22  EF 45-50%, DD indeterminent   Diabetes mellitus without complication ([place])    type 2   ESRD on hemodialysis ([place])    mon wed fri   Hypertension    Kidney failure    Myocardial infarction ([place])    Nonischemic cardiomyopathy [facility])     Past Surgical History:  Procedure Laterality Date   A/V FISTULAGRAM N/A 08/15/2019   Procedure: A/V FISTULAGRAM;  Surgeon: [name], MD;  Location: [place] [facility];  Service: Cardiovascular;  Laterality: N/A;   AMPUTATION Right 02/02/2022   Procedure: AMPUTATION 5th RAY;  Surgeon: [name], MD;  Location: [place];  Service: Podiatry;  Laterality: Right;   AMPUTATION Right 05/23/2022   Procedure: RIGHT BELOW KNEE AMPUTATION;  Surgeon: [name], MD;  Location: [place];  Service: Orthopedics;  Laterality: Right;   AMPUTATION Right 07/04/2022   Procedure: REVISION AMPUTATION BELOW KNEE;  Surgeon: [name], MD;  Location: [place];  Service: Orthopedics;  Laterality: Right;   APPLICATION OF WOUND VAC  05/23/2022   Procedure:  APPLICATION OF WOUND VAC;  Surgeon: [name], MD;  Location: [place];  Service: Orthopedics;;   AV FISTULA PLACEMENT     CARDIAC CATHETERIZATION  02/17/2018   LHC 02/17/18 ([name]): 30% pLAD, 30% dRCA, LVEDP 25-30, LVEF 25%.     CATARACT EXTRACTION W/PHACO Right 03/29/2020   Procedure: CATARACT EXTRACTION PHACO AND INTRAOCULAR LENS PLACEMENT ([place])  RIGHT DIABETIC;  Surgeon: [name], MD;  Location: [facility];  Service: Ophthalmology;  Laterality: Right;  Lot # [id number] H [place]: 02:30.9 AP% 9.5% CDE: 14.38   EYE SURGERY Right    cataract removed   I & D EXTREMITY Right 02/02/2022   Procedure: IRRIGATION AND DEBRIDEMENT RIGHT FOOT;  Surgeon: [name], MD;  Location: [place];  Service: Podiatry;  Laterality: Right;   INCISION AND DRAINAGE OF WOUND Right 02/08/2022   Procedure: IRRIGATION AND DEBRIDEMENT WOUND;  Surgeon: [name], DPM;  Location: [place];  Service: Podiatry;  Laterality: Right;  with removal of infected bone    INSERTION OF DIALYSIS CATHETER  08/13/2019   Procedure: Insertion Of Dialysis Catheter;  Surgeon: [name], MD;  Location: [place];  Service: Vascular;;   INSERTION OF DIALYSIS CATHETER Left 07/19/2020   Procedure: INSERTION OF DIALYSIS CATHETER;  Surgeon: [name], MD;  Location: [place];  Service: Vascular;  Laterality: Left;   IRRIGATION AND DEBRIDEMENT ABSCESS     PERIPHERAL VASCULAR BALLOON ANGIOPLASTY  08/15/2019   Procedure: PERIPHERAL VASCULAR BALLOON ANGIOPLASTY;  Surgeon: [name], MD;  Location: [place] [facility];  Service:  Cardiovascular;;   PERIPHERAL VASCULAR BALLOON ANGIOPLASTY Left 02/06/2022   Procedure: PERIPHERAL VASCULAR BALLOON ANGIOPLASTY;  Surgeon: [name], MD;  Location: [place] [facility];  Service: Cardiovascular;  Laterality: Left;   REVISON OF ARTERIOVENOUS FISTULA Left 07/19/2020   Procedure: REVISON/PLICATION OF ARTERIOVENOUS FISTULA LEFT;  Surgeon: [name], MD;  Location: [facility];  Service: Vascular;  Laterality: Left;   THROMBECTOMY AND REVISION OF ARTERIOVENTOUS (AV) GORETEX  GRAFT Left 08/13/2019   Procedure: THROMBECTOMY OF LEFT ARM ARTERIOVENTOUS (AV) FISTULA;  Surgeon: [name], MD;  Location: [facility];  Service: Vascular;  Laterality: Left;   TOE AMPUTATION      Allergies: Sulfa antibiotics  Medications: Prior to Admission medications   Medication Sig Start Date End Date Taking? Authorizing Provider  amLODipine (NORVASC) 5 MG tablet Take 5 mg by mouth daily. 07/15/22  Yes [provider]  aspirin EC 81 MG EC tablet Take 1 tablet (81 mg total) by mouth daily. Swallow whole. 02/21/22  Yes Sheikh, Omair Latif, DO  benzonatate (TESSALON) 200 MG capsule Take 200 mg by mouth See admin instructions. Take one capsule by mouth two times a day every Tue, Thu, Sat per [facility].   Yes [provider]  ethyl chloride spray Apply 1 Application topically Every Tuesday,Thursday,and Saturday with dialysis.   Yes [provider]  famotidine (PEPCID) 20 MG tablet Take 20 mg by mouth at bedtime.   Yes [provider]  guaifenesin (HUMIBID E) 400 MG TABS tablet Take 400 mg by mouth in the morning, at noon, and at bedtime.   Yes [provider]  guaiFENesin (ROBITUSSIN) 100 MG/5ML liquid Take 200 mg by mouth every 4 (four) hours as needed for cough.   Yes [provider]  hydrOXYzine (ATARAX) 25 MG tablet Take 25 mg by mouth See admin instructions. Take one tablet by mouth on Tues, Thur, and Saturdays   Yes [provider]  insulin glargine (LANTUS) 100 UNIT/ML injection Inject 0.32 mLs (32 Units total) into the skin at bedtime. 07/08/22  Yes Eugenie Filler, MD  ipratropium-albuterol (DUONEB) 0.5-2.5 (3) MG/3ML SOLN Take 3 mLs by nebulization every 4 (four) hours as needed (for wheezing or shortness of breath).   Yes [provider]  loperamide (IMODIUM) 2 MG capsule Take 1 capsule (2  mg total) by mouth as needed for diarrhea or loose stools. Patient taking differently: Take 2 mg by mouth daily as needed for diarrhea or loose stools. 07/08/22  Yes Eugenie Filler, MD  Menthol, Topical Analgesic, (BIOFREEZE) 4 % GEL Apply 1 Application topically every 4 (four) hours as needed (for pain).   Yes [provider]  Multiple Vitamin (MULTIVITAMIN WITH MINERALS) TABS tablet Take 1 tablet by mouth daily.   Yes [provider]  ondansetron (ZOFRAN) 4 MG tablet Take 4 mg by mouth every 6 (six) hours as needed for nausea or vomiting. 06/30/22  Yes [provider]  oxyCODONE-acetaminophen (PERCOCET) 5-325 MG tablet Take 1 tablet by mouth every 6 (six) hours as needed for severe pain. 10/20/22 10/20/23 Yes Arrien, Jimmy Picket, MD  pantoprazole (PROTONIX) 20 MG tablet Take 20 mg by mouth See admin instructions. Take 20mg  by mouth on mon, wed, fri and sun.   Yes [provider]  senna-docusate (SENOKOT-S) 8.6-50 MG tablet Take 1 tablet by mouth at bedtime as needed for mild constipation. 07/08/22  Yes Eugenie Filler, MD  sevelamer (RENAGEL) 800 MG tablet Take 2,400 mg by mouth See  admin instructions. Take 2400mg  by mouth three times daily on mon, wed, fri and sun.   Yes [provider]  sevelamer carbonate (RENVELA) 800 MG tablet Take 2,400 mg by mouth See admin instructions. Take 2400mg  by mouth twice daily on Tues, Thurs, and Saturdays.   Yes [provider]  Simethicone 80 MG TABS Take 160 mg by mouth every 8 (eight) hours as needed (for gas).   Yes [provider]  acetaminophen (TYLENOL) 325 MG tablet Take 1-2 tablets (325-650 mg total) by mouth every 6 (six) hours as needed for mild pain (pain score 1-3 or temp > 100.5). Patient not taking: Reported on 10/30/2022 07/08/22   Eugenie Filler, MD  Amino Acids-Protein Hydrolys (FEEDING SUPPLEMENT, PRO-STAT SUGAR FREE 64,) LIQD Take 30 mLs by mouth 2 (two) times daily. Patient  not taking: Reported on 10/30/2022    [provider]  Amino Acids-Protein Hydrolys (PRO-STAT AWC) LIQD Take 30 mLs by mouth 2 (two) times daily. Patient not taking: Reported on 10/30/2022    [provider]  gabapentin (NEURONTIN) 100 MG capsule Take 100 mg by mouth at bedtime.    [provider]  Nutritional Supplements (FEEDING SUPPLEMENT, NEPRO CARB STEADY,) LIQD Take 237 mLs by mouth 2 (two) times daily between meals. Patient not taking: Reported on 10/30/2022 10/20/22 11/19/22  Arrien, Jimmy Picket, MD     Family History  Problem Relation Age of Onset   Hypertension Mother     Social History   Socioeconomic History   Marital status: Divorced    Spouse name: Not on file   Number of children: Not on file   Years of education: Not on file   Highest education level: Not on file  Occupational History   Not on file  Tobacco Use   Smoking status: Never   Smokeless tobacco: Never  Vaping Use   Vaping Use: Never used  Substance and Sexual Activity   Alcohol use: Not Currently   Drug use: Never   Sexual activity: Not on file  Other Topics Concern   Not on file  Social History Narrative   Not on file   Social Determinants of Health   Financial Resource Strain: Not on file  Food Insecurity: No Food Insecurity (10/29/2022)   Hunger Vital Sign    Worried About Running Out of Food in the Last Year: Never true    Ran Out of Food in the Last Year: Never true  Transportation Needs: No Transportation Needs (10/29/2022)   PRAPARE - Hydrologist (Medical): No    Lack of Transportation (Non-Medical): No  Physical Activity: Not on file  Stress: Not on file  Social Connections: Not on file    Review of Systems: A 12 point ROS discussed and pertinent positives are indicated in the HPI above.  All other systems are negative.  Review of Systems  Constitutional:  Negative for chills and fever.  Respiratory:  Negative for chest  tightness and shortness of breath.   Cardiovascular:  Negative for chest pain and leg swelling.  Gastrointestinal:  Positive for abdominal pain and diarrhea. Negative for nausea and vomiting.  Neurological:  Negative for dizziness and headaches.  Psychiatric/Behavioral:  Negative for confusion.     Vital Signs: BP 107/64 (BP Location: Right Arm)   Pulse 80   Temp 98.3 F (36.8 C)   Resp 16   Wt 224 lb 3.3 oz (101.7 kg)   SpO2 94%   BMI 29.58 kg/m  Physical Exam Vitals reviewed.  Constitutional:      General: He is not in acute distress. HENT:     Mouth/Throat:     Mouth: Mucous membranes are moist.  Cardiovascular:     Rate and Rhythm: Normal rate and regular rhythm.     Pulses: Normal pulses.     Heart sounds: Normal heart sounds.  Pulmonary:     Effort: Pulmonary effort is normal.     Breath sounds: Normal breath sounds.  Abdominal:     General: Bowel sounds are normal.     Palpations: Abdomen is soft.     Tenderness: There is no abdominal tenderness.  Musculoskeletal:     Left lower leg: No edema.     Comments: Right below-knee amputation  Skin:    General: Skin is warm and dry.  Neurological:     Mental Status: He is alert and oriented to person, place, and time.  Psychiatric:        Mood and Affect: Mood normal.        Behavior: Behavior normal.        Thought Content: Thought content normal.        Judgment: Judgment normal.     Imaging: NM Hepato W/EF  Result Date: 10/30/2022 CLINICAL DATA:  Concern for cholecystitis. EXAM: NUCLEAR MEDICINE HEPATOBILIARY IMAGING WITH GALLBLADDER EF TECHNIQUE: Sequential images of the abdomen were obtained out to 60 minutes following intravenous administration of radiopharmaceutical. After oral ingestion of Ensure, gallbladder ejection fraction was determined. At 60 min, normal ejection fraction is greater than 33%. RADIOPHARMACEUTICALS:  5.4 mCi Tc-66m  Choletec IV COMPARISON:  MRI October 30, 2022 FINDINGS: Prompt  uptake and biliary excretion of activity by the liver is seen. Gallbladder activity is visualized, consistent with patency of cystic duct. Biliary activity passes into small bowel, consistent with patent common bile duct. Calculated gallbladder ejection fraction is 74%. (Normal gallbladder ejection fraction with Ensure is greater than 33%.) IMPRESSION: 1.  Patent cystic and common bile ducts. 2.  Normal gallbladder ejection fraction. Electronically Signed   By: Dahlia Bailiff M.D.   On: 10/30/2022 15:40   CT HEAD WO CONTRAST (5MM)  Result Date: 10/30/2022 CLINICAL DATA:  Headache EXAM: CT HEAD WITHOUT CONTRAST CT CERVICAL SPINE WITHOUT CONTRAST TECHNIQUE: Multidetector CT imaging of the head and cervical spine was performed following the standard protocol without intravenous contrast. Multiplanar CT image reconstructions of the cervical spine were also generated. RADIATION DOSE REDUCTION: This exam was performed according to the departmental dose-optimization program which includes automated exposure control, adjustment of the mA and/or kV according to patient size and/or use of iterative reconstruction technique. COMPARISON:  CTA head/neck 02/03/22 FINDINGS: CT HEAD FINDINGS Brain: No evidence of hemorrhage, hydrocephalus, extra-axial collection or mass lesion/mass effect. There is a chronic infarct involving the left caudate head and the left lentiform nucleus. No CT evidence for new infarct. Vascular: No hyperdense vessel or unexpected calcification. Skull: Normal. Negative for fracture or focal lesion. Sinuses/Orbits: Right lens replacement. Polypoid mucosal thickening left maxillary sinus. Other: None CT CERVICAL SPINE FINDINGS Limitations: Assessment is limited due to the degree of motion artifact. Alignment: Straightening of the normal cervical lordosis. Skull base and vertebrae: No acute fracture. No primary bone lesion or focal pathologic process. Soft tissues and spinal canal: No evidence of high-grade  spinal canal stenosis. Disc levels: There are multilevel degenerative changes likely at least mild-to-moderate spinal canal narrowing at C5-C6 and C6-C7. Upper chest: Atherosclerotic calcifications of the branch vessels. Small left  pleural effusion. No pneumothorax. Other: None IMPRESSION: 1. No acute intracranial abnormality. 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 2. No acute cervical spine fracture. Multilevel degenerative changes, with likely at least mild-to-moderate spinal canal narrowing at C5-C6 and C6-C7. 3. Small left pleural effusion. Electronically Signed   By: Marin Roberts M.D.   On: 10/30/2022 13:53   CT CERVICAL SPINE WO CONTRAST  Result Date: 10/30/2022 CLINICAL DATA:  Headache EXAM: CT HEAD WITHOUT CONTRAST CT CERVICAL SPINE WITHOUT CONTRAST TECHNIQUE: Multidetector CT imaging of the head and cervical spine was performed following the standard protocol without intravenous contrast. Multiplanar CT image reconstructions of the cervical spine were also generated. RADIATION DOSE REDUCTION: This exam was performed according to the departmental dose-optimization program which includes automated exposure control, adjustment of the mA and/or kV according to patient size and/or use of iterative reconstruction technique. COMPARISON:  CTA head/neck 02/03/22 FINDINGS: CT HEAD FINDINGS Brain: No evidence of hemorrhage, hydrocephalus, extra-axial collection or mass lesion/mass effect. There is a chronic infarct involving the left caudate head and the left lentiform nucleus. No CT evidence for new infarct. Vascular: No hyperdense vessel or unexpected calcification. Skull: Normal. Negative for fracture or focal lesion. Sinuses/Orbits: Right lens replacement. Polypoid mucosal thickening left maxillary sinus. Other: None CT CERVICAL SPINE FINDINGS Limitations: Assessment is limited due to the degree of motion artifact. Alignment: Straightening of the normal cervical lordosis.  Skull base and vertebrae: No acute fracture. No primary bone lesion or focal pathologic process. Soft tissues and spinal canal: No evidence of high-grade spinal canal stenosis. Disc levels: There are multilevel degenerative changes likely at least mild-to-moderate spinal canal narrowing at C5-C6 and C6-C7. Upper chest: Atherosclerotic calcifications of the branch vessels. Small left pleural effusion. No pneumothorax. Other: None IMPRESSION: 1. No acute intracranial abnormality. 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 2. No acute cervical spine fracture. Multilevel degenerative changes, with likely at least mild-to-moderate spinal canal narrowing at C5-C6 and C6-C7. 3. Small left pleural effusion. Electronically Signed   By: Marin Roberts M.D.   On: 10/30/2022 13:53   EEG adult  Result Date: 10/30/2022 Derek Jack, MD     10/30/2022 12:47 PM Routine EEG Report MORY HERRMAN Sr. is a 61 y.o. male with a history of altered mental status with visual auras who is undergoing an EEG to evaluate for seizures. Report: This EEG was acquired with electrodes placed according to the International 10-20 electrode system (including Fp1, Fp2, F3, F4, C3, C4, P3, P4, O1, O2, T3, T4, T5, T6, A1, A2, Fz, Cz, Pz). The following electrodes were missing or displaced: none. The occipital dominant rhythm was 8.5 Hz. This activity is reactive to stimulation. Drowsiness was manifested by background fragmentation; deeper stages of sleep were identified by K complexes and sleep spindles. There was no focal slowing. There were no interictal epileptiform discharges. There were no electrographic seizures identified. Photic stimulation and hyperventilation were not performed. Impression: This EEG was obtained while awake and asleep and is normal.   Clinical Correlation: Normal EEGs, however, do not rule out epilepsy. [name], MD [facility] [phone] If 7pm- 7am, please page  neurology on call as listed in [place].   MR ABDOMEN MRCP WO CONTRAST  Result Date: 10/30/2022 CLINICAL DATA:  61 year old male with suspected biliary obstruction. EXAM: MRI ABDOMEN WITHOUT CONTRAST  (INCLUDING MRCP) TECHNIQUE: Multiplanar multisequence MR imaging of the abdomen was performed. Heavily T2-weighted images of the biliary and pancreatic ducts  were obtained, and three-dimensional MRCP images were rendered by post processing. COMPARISON:  No prior abdominal MRI. Abdominal ultrasound 10/29/2022. FINDINGS: Comment: Today's study is limited for detection and characterization of visceral and/or vascular lesions by lack of IV gadolinium. Lower chest: Small left pleural effusion lying dependently. Cardiomegaly. Small amount of pericardial fluid and thickening. Hepatobiliary: Diffuse low signal intensity throughout the hepatic parenchyma on in phase dual echo images, and diffuse low signal intensity on T2 weighted sequences, indicative of a background of severe iron deposition. No discrete cystic or solid hepatic lesions are confidently identified on today's noncontrast examination. No intra or extrahepatic biliary ductal dilatation noted on MRCP images. Common bile duct measures 4 mm in the porta hepatis. No filling defect in the common bile duct to suggest choledocholithiasis. There are filling defects within the gallbladder measuring up to 1.5 cm in diameter, indicative of gallstones. Gallbladder is largely decompressed. Gallbladder wall appears mildly thickened and edematous, but there is no substantial volume of pericholecystic fluid. Pancreas: No definite pancreatic mass or peripancreatic fluid collections or inflammatory changes noted on today's noncontrast examination. No pancreatic ductal dilatation noted on MRCP images. Spleen: Diffuse loss of signal intensity throughout the spleen on in phase dual echo images and T2 weighted images indicative of iron deposition. Adrenals/Urinary Tract: Multiple  subcentimeter T1 hypointense, T2 hyperintense lesions scattered throughout both kidneys, incompletely characterized on today's noncontrast examination, but statistically likely to represent small cysts (no imaging follow-up recommended). No hydroureteronephrosis in the visualized portions of the abdomen. Bilateral adrenal glands are normal in appearance. Stomach/Bowel: Visualized portions are unremarkable. Vascular/Lymphatic: No aneurysm identified in the visualized abdominal vasculature. No lymphadenopathy noted in the abdomen. Other:  No significant volume of ascites. Musculoskeletal: No aggressive appearing osseous lesions are noted in the visualized portions of the skeleton. IMPRESSION: 1. Study is positive for cholelithiasis. While there is some mild gallbladder wall thickening and edema, the gallbladder is nearly completely decompressed. These findings in conjunction with the recent ultrasound examination do not support a diagnosis of acute cholecystitis at this time. 2. No evidence of choledocholithiasis or biliary tract obstruction. 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 Electronically Signed   By: Vinnie Langton M.D.   On: 10/30/2022 05:27   DG Shoulder Left  Result Date: 10/29/2022 CLINICAL DATA:  Left shoulder pain no injury EXAM: LEFT SHOULDER - 2+ VIEW COMPARISON:  Left shoulder 02/06/2008 FINDINGS: There is no evidence of fracture or dislocation. There is no evidence of arthropathy or other focal bone abnormality. Vascular stent overlying the proximal humerus. IMPRESSION: Negative. Electronically Signed   By: Franchot Gallo M.D.   On: 10/29/2022 16:07   US Abdomen Limited RUQ (LIVER/GB)  Result Date: 10/29/2022 CLINICAL DATA:  Right upper quadrant pain for 1 week. EXAM: ULTRASOUND ABDOMEN LIMITED RIGHT UPPER QUADRANT COMPARISON:  None Available. FINDINGS: Gallbladder: Shadowing echogenic stones measure up to 1.5 cm. Associated wall thickening, 8 mm. Negative sonographic Murphy sign. No  pericholecystic fluid. Common bile duct: Diameter: Approximately 3 mm, within normal limits. No intrahepatic biliary ductal dilatation. Liver: Parenchymal echogenicity is within normal limits. No definite focal lesion. Portal vein is patent on color Doppler imaging with normal direction of blood flow towards the liver. Other: None. IMPRESSION: Cholelithiasis. Associated wall thickening without a positive sonographic Percell Miller sign can be seen in the setting of chronic cholecystitis. Electronically Signed   By: Lorin Picket M.D.   On: 10/29/2022 15:26   CT CHEST WO CONTRAST  Result Date: 10/19/2022 CLINICAL DATA:  Possible complicated  pneumonia. EXAM: CT CHEST WITHOUT CONTRAST TECHNIQUE: Multidetector CT imaging of the chest was performed following the standard protocol without IV contrast. RADIATION DOSE REDUCTION: This exam was performed according to the departmental dose-optimization program which includes automated exposure control, adjustment of the mA and/or kV according to patient size and/or use of iterative reconstruction technique. COMPARISON:  Abdominal CT 08/05/2019 and chest x-ray 10/19/2022 FINDINGS: Cardiovascular: Mild cardiomegaly. Small pericardial effusion is present. Calcified plaque over the left main and 3 vessel coronary arteries. Mild calcification the region the mitral valve annulus and aortic valve. Thoracic aorta is normal in caliber, although there is minimal ill definition of the ascending thoracic aortic wall which may be due to the adjacent tracking pericardial fluid. There is calcified plaque over the thoracic aorta. Calcified plaque over the descending thoracic aorta. Remaining mediastinal structures are unremarkable. Mediastinum/Nodes: No mediastinal or hilar adenopathy. Lungs/Pleura: Small left pleural effusion with associated basilar atelectasis. Mild compressive atelectasis over the right base with possible tiny amount of associated pleural fluid. Remainder of the lungs are  clear. Airways are unremarkable. Upper Abdomen: Calcified plaque over the abdominal aorta. Minimal fluid over the left upper quadrant adjacent the spleen. 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 IMPRESSION: 1. Mild cardiomegaly with small pericardial effusion which is new compared to the prior exam. Slight ill definition of the ascending thoracic aorta which is otherwise normal in caliber. If clinical concern for aortic root integrity or aortic dissection, consider contrast-enhanced CT for further evaluation. 2. Small left pleural effusion with associated basilar atelectasis. Mild compressive atelectasis over the right base with possible tiny amount of associated pleural fluid. 3. Minimal free fluid over the left upper quadrant adjacent the spleen. 4. Aortic atherosclerosis. Atherosclerotic coronary artery disease. 5. 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 Aortic Atherosclerosis (ICD10-I70.0). These results will be called to the ordering clinician or representative by the Radiologist Assistant, and communication documented in the PACS or [employer]. Electronically Signed   By: Marin Olp M.D.   On: 10/19/2022 11:35   DG Chest 1 View  Result Date: 10/19/2022 CLINICAL DATA:  CHF EXAM: CHEST  1 VIEW COMPARISON:  Yesterday FINDINGS: Cardiopericardial enlargement with congested central vessels. Possible small left pleural effusion. No consolidation or air leak. Artifact from EKG leads. IMPRESSION: Mildly improved aeration at the right base. Similar degree of vascular congestion. Electronically Signed   By: Jorje Guild M.D.   On: 10/19/2022 07:42   DG Chest 2 View  Result Date: 10/18/2022 CLINICAL DATA:  Chest pain EXAM: CHEST - 2 VIEW COMPARISON:  07/01/2022 FINDINGS: Stable cardiomegaly. Pulmonary vascular congestion with diffuse interstitial prominence. Trace bilateral pleural effusions. Patchy bibasilar opacities. No pneumothorax. IMPRESSION: 1. Cardiomegaly with  pulmonary vascular congestion and interstitial edema. 2. Trace bilateral pleural effusions. Bibasilar opacities may reflect atelectasis or pneumonia. Electronically Signed   By: Davina Poke D.O.   On: 10/18/2022 12:23    Labs:  CBC: Recent Labs    10/31/22 0538 11/01/22 1630 11/04/22 0913 11/05/22 0243  WBC 8.6 7.5 8.1 8.2  HGB 10.9* 10.0* 10.3* 10.5*  HCT 31.8* 29.1* 29.8* 31.6*  PLT 380 374 436* 439*    COAGS: Recent Labs    02/02/22 0107 02/03/22 0037 07/01/22 1344 10/29/22 1438 11/01/22 0435 11/02/22 0221 11/03/22 0333 11/05/22 0243 11/06/22 0525  INR 1.3* 1.3* 1.4* 1.4*   < > 1.4* 1.4* 1.5* 1.5*  APTT 31 33 37* 34  --   --   --   --   --    < > =  values in this interval not displayed.    BMP: Recent Labs    11/03/22 0333 11/04/22 0903 11/05/22 0243 11/06/22 0525  NA 133* 132* 132* 132*  K 4.7 4.8 4.5 4.5  CL 91* 91* 90* 91*  CO2 29 27 29 28   GLUCOSE 71 140* 151* 121*  BUN 34* 43* 27* 36*  CALCIUM 9.9 9.4 9.5 9.4  CREATININE 9.24* 10.82* 7.69* 9.88*  GFRNONAA 6* 5* 7* 6*    LIVER FUNCTION TESTS: Recent Labs    11/02/22 0221 11/03/22 0333 11/04/22 0903 11/05/22 0243 11/06/22 0525  BILITOT 7.4* 7.6*  --  6.9* 6.3*  AST 44* 38  --  41 39  ALT 32 31  --  30 30  ALKPHOS 293* 279*  --  246* 227*  PROT 8.6* 8.7*  --  8.4* 8.3*  ALBUMIN 2.4* 2.4* 2.2* 2.2* 2.2*    TUMOR MARKERS: No results for input(s): "AFPTM", "CEA", "CA199", "CHROMGRNA" in the last 8760 hours.  Assessment and Plan:   Abnormal Liver Enzymes Patient has been experiencing elevated alkaline phosphatase levels as well as elevated total bilirubin levels, without sonographic or clinical evidence of cholecystitis. IR has been consulted to perform image-guided random liver biopsy with moderate sedation. The case has been reviewed and is approved tentatively for 12/22. Last ASA dose was 12/17.  Risks and benefits of image-guided liver biopsy was discussed with the patient and/or  patient's family including, but not limited to bleeding, infection, damage to adjacent structures or low yield requiring additional tests.  All of the questions were answered and there is agreement to proceed.  Consent signed and in IR suite.    Thank you for this interesting consult.  I greatly enjoyed meeting CRIT OBREMSKI Sr. and look forward to participating in their care.  A copy of this report was sent to the requesting provider on this date.  Electronically Signed: Lura Em, PA-C 11/06/2022, 2:40 PM   I spent a total of 40 Minutes    in face to face in clinical consultation, greater than 50% of which was counseling/coordinating care for abnormal liver enzymes

## 2022-11-06 NOTE — Progress Notes (Signed)
Received patient in bed to unit.  Alert and oriented.  Informed consent signed and in chart.   Treatment initiated: 0904 Treatment completed: 1234  Patient tolerated well.  Transported back to the room  Alert, without acute distress.  Hand-off given to patient's nurse.   Access used: AVF Access issues: none  Total UF removed: 3 L Medication(s) given: Benadryl 25 mg PO. Hectorl 25 mg  Post HD VS: 107/66 P 80 R 20 Post HD weight:98.7kg   [facility]

## 2022-11-06 NOTE — Progress Notes (Signed)
PROGRESS NOTE    [name] [name].  ZDG:[id number] DOB: 04-01-1961 DOA: 10/29/2022 PCP: [place], Betty G, MD   Brief Narrative:  This 61 y.o. male with PMH significant of ESRD on HD TTS, HFrEF, T2DM, PVD s/p right BKA, HTN who presented with c/o: neck pain, visual aura, abdominal pain. Patient reports having epigastric and left upper quadrant abdominal pain for the past 3 weeks. He also reports intermittent nausea, vomiting and diarrhea.  Reportedly already had ultrasound outpatient about 3 weeks ago showing gallstones and was getting workup outpatient. RUQ ultrasound showed cholelithiasis but no positive sonographic Murphy sign. No wall thickening to suggest cholecystitis. He was evaluated by general surgery who signed off.  GI is following for hyperbilirubinemia and liver disease. Plan: Liver biopsy this week. Aspirin on hold.  Assessment & Plan:   Principal Problem:   Hyperbilirubinemia Active Problems:   ESRD (end stage renal disease) (HCC)   HTN (hypertension)   DM2 (diabetes mellitus, type 2) (HCC)   History of CVA    Hyperkalemia   Cholelithiasis   RUQ abdominal pain  Abdominal pain with hyperbilirubinemia: MRCP does not suggest choledocholithiasis.   No evidence for cholecystitis / HIDA scan negative.  Gallstones were seen. Alkaline phosphatase is mildly elevated.  Bilirubin is significantly elevated majority of it being direct.   Noted to have mildly elevated bilirubin in July but it was normal back in April. Etiology is unclear, bilirubin is still rising. Hepatitis panel unremarkable. Gastroenterology consulted.  Extensive workup completed. Plan is for liver biopsy tomorrow, aspirin is on hold.   Bilirubin is improving.  Liver biopsy scheduled for 11/07/2022.  GI will follow-up after that.   Visual aura/ Neck pain: Patient denies these symptoms in the past. He had a stroke earlier this year.  No history of seizures and migraine headaches.   CT head as well as EEG  negative.  He has not had any complaint since several days now.   End-stage renal disease on hemodialysis: Continue HD as per schedule.  He has not missed hemodialysis.   Nephrology on board.  Dialysis per them.   Essential hypertension Stable.  Continue amlodipine.   Diabetes mellitus type 2 HbA1c in July was 8.2.  Repeat hemoglobin A1c 5.6.    At home he was taking Lantus 32 units and sliding scale.  Currently on 20 units of Lantus and SSI, blood sugar controlled.  History of stroke: Continue aspirin.   Chronic systolic CHF LVEF is 45 to 50%. Recently admitted for volume overload which corrected after hemodialysis. Appears euvolemic.    DVT prophylaxis: Lovenox Code Status: Full code Family Communication: No family at bed side Disposition Plan:   Status is: Inpatient Remains inpatient appropriate because: Admitted for hyperbilirubinemia.  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  Liver biopsy is planned for this week.  GI is on the board.    Consultants:  Gastroenterology General surgery  Procedures: HIDA scan, MRCP.  Antimicrobials:   Anti-infectives (From admission, onward)    None       Subjective:  Seen and examined in dialysis unit, he has no complaints.  Objective: Vitals:   11/06/22 0845 11/06/22 0904 11/06/22 0930 11/06/22 1000  BP: 131/71 114/68 (!) 142/88 127/60  Pulse: 86 84 85 85  Resp: 14 20 (!) 22 (!) 21  Temp: 98.4 F (36.9 C)     TempSrc: Oral     SpO2: 94% 93% 93% 95%  Weight: 101.7 kg      No intake  or output data in the 24 hours ending 11/06/22 1028  Filed Weights   11/04/22 0845 11/04/22 1259 11/06/22 0845  Weight: 103.3 kg 102.1 kg 101.7 kg    Examination:  General exam: Appears calm and comfortable  Respiratory system: Clear to auscultation. Respiratory effort normal. Cardiovascular system: S1 & S2 heard, RRR. No JVD, murmurs, rubs, gallops or clicks. No pedal edema. Gastrointestinal system: Abdomen is  nondistended, soft and nontender. No organomegaly or masses felt. Normal bowel sounds heard. Central nervous system: Alert and oriented. No focal neurological deficits. Extremities: Symmetric 5 x 5 power. Skin: No rashes, lesions or ulcers.  Psychiatry: Judgement and insight appear normal. Mood & affect appropriate.    Data Reviewed: I have personally reviewed following labs and imaging studies  CBC: Recent Labs  Lab 10/31/22 0538 11/01/22 1630 11/04/22 0913 11/05/22 0243  WBC 8.6 7.5 8.1 8.2  HGB 10.9* 10.0* 10.3* 10.5*  HCT 31.8* 29.1* 29.8* 31.6*  MCV 84.8 86.6 87.6 89.3  PLT 380 374 436* 439*    Basic Metabolic Panel: Recent Labs  Lab 10/31/22 0538 11/01/22 0435 11/02/22 0221 11/03/22 0333 11/04/22 0903 11/05/22 0243 11/06/22 0525  NA 133*   < > 134* 133* 132* 132* 132*  K 4.6   < > 4.2 4.7 4.8 4.5 4.5  CL 89*   < > 91* 91* 91* 90* 91*  CO2 29   < > 30 29 27 29 28   GLUCOSE 225*   < > 91 71 140* 151* 121*  BUN 28*   < > 26* 34* 43* 27* 36*  CREATININE 7.00*   < > 7.34* 9.24* 10.82* 7.69* 9.88*  CALCIUM 9.4   < > 9.6 9.9 9.4 9.5 9.4  PHOS 4.7*  --   --   --  6.2*  --   --    < > = values in this interval not displayed.    GFR: Estimated Creatinine Clearance: 10 mL/min (A) (by C-G formula based on SCr of 9.88 mg/dL (H)). Liver Function Tests: Recent Labs  Lab 11/01/22 0435 11/02/22 0221 11/03/22 0333 11/04/22 0903 11/05/22 0243 11/06/22 0525  AST 35 44* 38  --  41 39  ALT 32 32 31  --  30 30  ALKPHOS 257* 293* 279*  --  246* 227*  BILITOT 7.7* 7.4* 7.6*  --  6.9* 6.3*  PROT 8.3* 8.6* 8.7*  --  8.4* 8.3*  ALBUMIN 2.4* 2.4* 2.4* 2.2* 2.2* 2.2*    No results for input(s): "LIPASE", "AMYLASE" in the last 168 hours.  No results for input(s): "AMMONIA" in the last 168 hours.  Coagulation Profile: Recent Labs  Lab 11/01/22 0435 11/02/22 0221 11/03/22 0333 11/05/22 0243 11/06/22 0525  INR 1.5* 1.4* 1.4* 1.5* 1.5*    Cardiac Enzymes: No results  for input(s): "CKTOTAL", "CKMB", "CKMBINDEX", "TROPONINI" in the last 168 hours. BNP (last 3 results) No results for input(s): "PROBNP" in the last 8760 hours. HbA1C: No results for input(s): "HGBA1C" in the last 72 hours. CBG: Recent Labs  Lab 11/05/22 0724 11/05/22 1158 11/05/22 1639 11/05/22 1948 11/06/22 0717  GLUCAP 160* 132* 121* 104* 98    Lipid Profile: No results for input(s): "CHOL", "HDL", "LDLCALC", "TRIG", "CHOLHDL", "LDLDIRECT" in the last 72 hours. Thyroid Function Tests: No results for input(s): "TSH", "T4TOTAL", "FREET4", "T3FREE", "THYROIDAB" in the last 72 hours. Anemia Panel: No results for input(s): "VITAMINB12", "FOLATE", "FERRITIN", "TIBC", "IRON", "RETICCTPCT" in the last 72 hours. Sepsis Labs: No results for input(s): "PROCALCITON", "LATICACIDVEN" in  the last 168 hours.  No results found for this or any previous visit (from the past 240 hour(s)).   Radiology Studies: No results found.  Scheduled Meds:  amLODipine  5 mg Oral Daily   Chlorhexidine Gluconate Cloth  6 each Topical Q0600   Chlorhexidine Gluconate Cloth  6 each Topical Q0600   doxercalciferol  4 mcg Intravenous Q T,Th,Sa-HD   [START ON 11/08/2022] enoxaparin (LOVENOX) injection  30 mg Subcutaneous Q24H   insulin aspart  0-5 Units Subcutaneous QHS   insulin aspart  0-9 Units Subcutaneous TID WC   insulin glargine-yfgn  20 Units Subcutaneous Daily   sevelamer carbonate  2,400 mg Oral TID WC   Continuous Infusions:   LOS: 8 days    Time spent: 35 mins    Darliss Cheney, MD [facility]   If 7PM-7AM, please contact night-coverage

## 2022-11-07 ENCOUNTER — Inpatient Hospital Stay (HOSPITAL_COMMUNITY): Payer: Medicare Other

## 2022-11-07 ENCOUNTER — Telehealth: Payer: Self-pay

## 2022-11-07 DIAGNOSIS — Z992 Dependence on renal dialysis: Secondary | ICD-10-CM | POA: Diagnosis not present

## 2022-11-07 DIAGNOSIS — N186 End stage renal disease: Secondary | ICD-10-CM | POA: Diagnosis not present

## 2022-11-07 HISTORY — PX: IR US GUIDE BX ASP/DRAIN: IMG2392

## 2022-11-07 LAB — GLUCOSE, CAPILLARY
Glucose-Capillary: 102 mg/dL — ABNORMAL HIGH (ref 70–99)
Glucose-Capillary: 99 mg/dL (ref 70–99)
Glucose-Capillary: 104 mg/dL — ABNORMAL HIGH (ref 70–99)
Glucose-Capillary: 128 mg/dL — ABNORMAL HIGH (ref 70–99)

## 2022-11-07 LAB — COMPREHENSIVE METABOLIC PANEL
ALT: 28 U/L (ref 0–44)
AST: 41 U/L (ref 15–41)
Albumin: 2.1 g/dL — ABNORMAL LOW (ref 3.5–5.0)
Alkaline Phosphatase: 235 U/L — ABNORMAL HIGH (ref 38–126)
Anion gap: 12 (ref 5–15)
BUN: 28 mg/dL — ABNORMAL HIGH (ref 6–20)
CO2: 29 mmol/L (ref 22–32)
Calcium: 9.3 mg/dL (ref 8.9–10.3)
Chloride: 93 mmol/L — ABNORMAL LOW (ref 98–111)
Creatinine, Ser: 7.68 mg/dL — ABNORMAL HIGH (ref 0.61–1.24)
GFR, Estimated: 7 mL/min — ABNORMAL LOW (ref >60)
Glucose, Bld: 103 mg/dL — ABNORMAL HIGH (ref 70–99)
Potassium: 4.1 mmol/L (ref 3.5–5.1)
Sodium: 134 mmol/L — ABNORMAL LOW (ref 135–145)
Total Bilirubin: 5.3 mg/dL — ABNORMAL HIGH (ref 0.3–1.2)
Total Protein: 7.8 g/dL (ref 6.5–8.1)

## 2022-11-07 LAB — PROTIME-INR
INR: 1.5 — ABNORMAL HIGH (ref 0.8–1.2)
Prothrombin Time: 17.6 seconds — ABNORMAL HIGH (ref 11.4–15.2)

## 2022-11-07 MED ORDER — MIDAZOLAM HCL 2 MG/2ML IJ SOLN
INTRAMUSCULAR | Status: AC
  Filled 2022-11-07: qty 2

## 2022-11-07 MED ORDER — FENTANYL CITRATE (PF) 100 MCG/2ML IJ SOLN
INTRAMUSCULAR | Status: AC
  Filled 2022-11-07: qty 2

## 2022-11-07 MED ORDER — FENTANYL CITRATE (PF) 100 MCG/2ML IJ SOLN
INTRAMUSCULAR | Status: AC | PRN
  Administered 2022-11-07: 25 ug via INTRAVENOUS

## 2022-11-07 MED ORDER — LIDOCAINE-EPINEPHRINE 1 %-1:100000 IJ SOLN
INTRAMUSCULAR | Status: AC
  Filled 2022-11-07: qty 1

## 2022-11-07 MED ORDER — MIDAZOLAM HCL 2 MG/2ML IJ SOLN
INTRAMUSCULAR | Status: AC | PRN
  Administered 2022-11-07: .5 mg via INTRAVENOUS

## 2022-11-07 NOTE — Progress Notes (Addendum)
Attending physician's note   I have taken a history, reviewed the chart, and examined the patient. I performed a substantive portion of this encounter, including complete performance of at least one of the key components, in conjunction with the APP. I agree with the APP's note, impression, and recommendations with my edits.   T. bili downtrending, now 5.3.  AST/ALT normal and ALP largely stable.  Liver biopsy completed today.  I suspect it will be several days before pathology comes back given the long holiday weekend.  Otherwise, do not see any reason he needs to remain inpatient from a GI/hepatology standpoint.  GI service will follow peripherally for the remainder of his hospital course, and otherwise plan to review his pathology results as an outpatient.    - To follow-up w/ Dr. Havery Moros in the GI Clinic on Thursday, 11/20/22 at 10:30 AM - Please do not hesitate to contact me with any additional questions or concerns.   [place], DO, FACG [phone] office          Progress Note   Subjective  LOS: 9 days Chief Complaint: Jaundice with biliary obstruction  Patient is laying in bed asleep at time of my exam today.  He is scheduled for a liver biopsy today with IR.  No new complaints or concerns.  Does describe that his abdomen is tender all over when palpated.   Objective   Vital signs in last 24 hours: Temp:  [97.6 F (36.4 C)-98.4 F (36.9 C)] 98 F (36.7 C) (12/22 0818) Pulse Rate:  [78-87] 82 (12/22 0818) Resp:  [16-21] 18 (12/22 0818) BP: (95-136)/(60-70) 125/67 (12/22 0818) SpO2:  [88 %-99 %] 92 % (12/22 0818) Last BM Date : 11/06/22 General:    AA male in NAD Heart:  Regular rate and rhythm; no murmurs Lungs: Respirations even and unlabored, lungs CTA bilaterally Abdomen:  Soft, mild generalized ttp and nondistended. Normal bowel sounds. Psych:  Cooperative. Asleep/Sleepy  Intake/Output from previous day: 12/21 0701 - 12/22 0700 In: -  Out:  3000   Lab Results: Recent Labs    11/05/22 0243  WBC 8.2  HGB 10.5*  HCT 31.6*  PLT 439*   BMET Recent Labs    11/05/22 0243 11/06/22 0525 11/07/22 0256  NA 132* 132* 134*  K 4.5 4.5 4.1  CL 90* 91* 93*  CO2 _0 GLUCOSE 151* 121* 103*  BUN 27* 36* 28*  CREATININE 7.69* 9.88* 7.68*  CALCIUM 9.5 9.4 9.3      Latest Ref Rng & Units 11/07/2022    2:56 AM 11/06/2022    5:25 AM 11/05/2022    2:43 AM  Hepatic Function  Total Protein 6.5 - 8.1 g/dL 7.8  8.3  8.4   Albumin 3.5 - 5.0 g/dL 2.1  2.2  2.2   AST 15 - 41 U/L 41  39  41   ALT 0 - 44 U/L _1 Alk Phosphatase 38 - 126 U/L 235  227  246   Total Bilirubin 0.3 - 1.2 mg/dL 5.3  6.3  6.9      PT/INR Recent Labs    11/06/22 0525 11/07/22 0256  LABPROT 17.9* 17.6*  INR 1.5* 1.5*    Assessment / Plan:   Assessment: 1.  Jaundice without bili obstruction: Lower abdominal pain, gallstones, HIDA normal, MRCP with gallbladder stones, normal CBD, ferritin elevated at 1410, ANA negative, IgG elevated 2871, IgG4 elevated 109, smooth muscle antibody  negative, mitochondrial antibody negative, GGT elevated to 93, hemochromatosis DNA PCR in process, hep ABC serologies negative, T. bili slowly improving, alk phos remains elevated, scheduled for liver biopsy today with IR 2.  Anemia: Hemoglobin stable 3.  Hyponatremia 4.  ESRD on HD Tuesday Thursday Saturday 5.  Right BKA 05/2022, revision 06/2022  Plan: 1.  Liver biopsy scheduled today for IR. 2.  Further recommendations based off pathology results.  Thank you for your kind consultation, we will likely follow peripherally from now on.    LOS: 9 days   Levin Erp  11/07/2022, 10:43 AM

## 2022-11-07 NOTE — Progress Notes (Signed)
Patient ID: [name] Cruel Sr., male   DOB: 1961/07/22, 61 y.o.   MRN: [id number] S: NO new complaints. O:BP 125/67 (BP Location: Right Arm)   Pulse 82   Temp 98 F (36.7 C) (Oral)   Resp 18   Wt 101.7 kg   SpO2 92%   BMI 29.58 kg/m   Intake/Output Summary (Last 24 hours) at 11/07/2022 1008 Last data filed at 11/06/2022 1239 Gross per 24 hour  Intake --  Output 3000 ml  Net -3000 ml   Intake/Output: I/O last 3 completed shifts: In: -  Out: 3000 [Other:3000]  Intake/Output this shift:  No intake/output data recorded. Weight change:  Gen: NAD CVS: RRR Resp: CTA Abd: _+BS, soft, mild tenderness in RUQ Ext: no edema, LUE AVF +T/B  Recent Labs  Lab 11/01/22 0435 11/02/22 0221 11/03/22 0333 11/04/22 0903 11/05/22 0243 11/06/22 0525 11/07/22 0256  NA 133* 134* 133* 132* 132* 132* 134*  K 5.0 4.2 4.7 4.8 4.5 4.5 4.1  CL 91* 91* 91* 91* 90* 91* 93*  CO2 29 30 29 27 29 28 29   GLUCOSE 220* 91 71 140* 151* 121* 103*  BUN 37* 26* 34* 43* 27* 36* 28*  CREATININE 9.09* 7.34* 9.24* 10.82* 7.69* 9.88* 7.68*  ALBUMIN 2.4* 2.4* 2.4* 2.2* 2.2* 2.2* 2.1*  CALCIUM 9.6 9.6 9.9 9.4 9.5 9.4 9.3  PHOS  --   --   --  6.2*  --   --   --   AST 35 44* 38  --  41 39 41  ALT 32 32 31  --  30 30 28    Liver Function Tests: Recent Labs  Lab 11/05/22 0243 11/06/22 0525 11/07/22 0256  AST 41 39 41  ALT 30 30 28   ALKPHOS 246* 227* 235*  BILITOT 6.9* 6.3* 5.3*  PROT 8.4* 8.3* 7.8  ALBUMIN 2.2* 2.2* 2.1*   No results for input(s): "LIPASE", "AMYLASE" in the last 168 hours. No results for input(s): "AMMONIA" in the last 168 hours. CBC: Recent Labs  Lab 11/01/22 1630 11/04/22 0913 11/05/22 0243  WBC 7.5 8.1 8.2  HGB 10.0* 10.3* 10.5*  HCT 29.1* 29.8* 31.6*  MCV 86.6 87.6 89.3  PLT 374 436* 439*   Cardiac Enzymes: No results for input(s): "CKTOTAL", "CKMB", "CKMBINDEX", "TROPONINI" in the last 168 hours. CBG: Recent Labs  Lab 11/05/22 1948 11/06/22 0717 11/06/22 1610  11/06/22 2151 11/07/22 0818  GLUCAP 104* 98 111* 111* 102*    Iron Studies: No results for input(s): "IRON", "TIBC", "TRANSFERRIN", "FERRITIN" in the last 72 hours. Studies/Results: No results found.  amLODipine  5 mg Oral Daily   Chlorhexidine Gluconate Cloth  6 each Topical Q0600   Chlorhexidine Gluconate Cloth  6 each Topical Q0600   doxercalciferol  4 mcg Intravenous Q T,Th,Sa-HD   [START ON 11/08/2022] enoxaparin (LOVENOX) injection  30 mg Subcutaneous Q24H   insulin aspart  0-5 Units Subcutaneous QHS   insulin aspart  0-9 Units Subcutaneous TID WC   insulin glargine-yfgn  20 Units Subcutaneous Daily   sevelamer carbonate  2,400 mg Oral TID WC    BMET    Component Value Date/Time   NA 134 (L) 11/07/2022 0256   K 4.1 11/07/2022 0256   CL 93 (L) 11/07/2022 0256   CO2 29 11/07/2022 0256   GLUCOSE 103 (H) 11/07/2022 0256   BUN 28 (H) 11/07/2022 0256   CREATININE 7.68 (H) 11/07/2022 0256   CALCIUM 9.3 11/07/2022 0256   GFRNONAA 7 (L) 11/07/2022 0256  GFRAA 3 (L) 08/14/2019 2051   CBC    Component Value Date/Time   WBC 8.2 11/05/2022 0243   RBC 3.54 (L) 11/05/2022 0243   HGB 10.5 (L) 11/05/2022 0243   HCT 31.6 (L) 11/05/2022 0243   PLT 439 (H) 11/05/2022 0243   MCV 89.3 11/05/2022 0243   MCH 29.7 11/05/2022 0243   MCHC 33.2 11/05/2022 0243   RDW 22.4 (H) 11/05/2022 0243   LYMPHSABS 1.5 10/29/2022 1438   MONOABS 1.9 (H) 10/29/2022 1438   EOSABS 0.5 10/29/2022 1438   BASOSABS 0.1 10/29/2022 1438    OP HD: [place] TTS 4h 77min 450/ 500   104kg   2/2 bath  Heparin 6000  LUA AVF - last HD 12/12, post wt 103.3kg - venofer 100 tiw IV thru 12/19 - hectorol 4 ug IV tiw - no esa, last Hb 11.0     ED afeb, normal BP to soft   Tbili 6.5, AST 54/ ALT 36   RUQ [place] +stones, neg murphy's    Assessment/ Plan: Abd pain/ jaundice - GI consulting. Possible chronic process, evaluating for autoimmune, infection and other causes. HIDA scan negative, MRCP showed gallstone but no  evidence of biliary tract obstruction.  For liver biopsy possibly today or tomorrow per IR. ESRD - on HD TTS. continue with outpatient schedule.  HTN/ vol - euvolemic on exam, at dry wt. 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 Follow.  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 Cont renvela 3 tid ac. Cont IV vdra.  H/o CVA DM2 Debility - chronic, lives at [place], MD [employer]

## 2022-11-07 NOTE — Procedures (Signed)
Interventional Radiology Procedure Note  Date of Procedure: 11/07/2022  Procedure: [place] random liver biopsy   Findings:  1. [place] random liver core needle biopsy 18ga x2 passes    Complications: No immediate complications noted.   Estimated Blood Loss: minimal  Follow-up and Recommendations: 1. Bedrest 2 hours    Albin Felling, MD  Vascular & Interventional Radiology  11/07/2022 2:18 PM

## 2022-11-07 NOTE — Progress Notes (Signed)
CSW spoke with Kia at [facility] to inform her of patient's current status. Patient is scheduled for a liver biopsy today in IR.  [name], MSW, LCSW Transitions of Care  Clinical Social Worker II [phone]

## 2022-11-07 NOTE — Progress Notes (Signed)
Pt receives out-pt HD at [facility] on TTS. Pt admitted from snf. Will assist as needed.   Asyia Hornung Renal Navigator [phone] 

## 2022-11-07 NOTE — Progress Notes (Signed)
PROGRESS NOTE    [name] Cruel Sr.  ZSW:[id number] DOB: 1961-05-26 DOA: 10/29/2022 PCP: [place], Betty G, MD   Brief Narrative:  This 61 y.o. male with PMH significant of ESRD on HD TTS, HFrEF, T2DM, PVD s/p right BKA, HTN who presented with c/o: neck pain, visual aura, abdominal pain. Patient reports having epigastric and left upper quadrant abdominal pain for the past 3 weeks. He also reports intermittent nausea, vomiting and diarrhea.  Reportedly already had ultrasound outpatient about 3 weeks ago showing gallstones and was getting workup outpatient. RUQ ultrasound showed cholelithiasis but no positive sonographic Murphy sign. No wall thickening to suggest cholecystitis. He was evaluated by general surgery who signed off.  GI is following for hyperbilirubinemia and liver disease. Plan: Liver biopsy this week. Aspirin on hold.  Assessment & Plan:   Principal Problem:   Hyperbilirubinemia Active Problems:   ESRD (end stage renal disease) (HCC)   HTN (hypertension)   DM2 (diabetes mellitus, type 2) (HCC)   History of CVA    Hyperkalemia   Cholelithiasis   RUQ abdominal pain  Abdominal pain with hyperbilirubinemia: MRCP does not suggest choledocholithiasis.   No evidence for cholecystitis / HIDA scan negative.  Gallstones were seen. Alkaline phosphatase is mildly elevated.  Bilirubin is significantly elevated majority of it being direct.   Noted to have mildly elevated bilirubin in July but it was normal back in April. Etiology is unclear, bilirubin is still rising. Hepatitis panel unremarkable. Gastroenterology consulted.  Extensive workup completed. Plan is for liver biopsy tomorrow, aspirin is on hold.   Bilirubin is improving.  He is scheduled for liver biopsy today.   Visual aura/ Neck pain: Patient denies these symptoms in the past. He had a stroke earlier this year.  No history of seizures and migraine headaches.   CT head as well as EEG negative.  He has not had any  complaint since several days now.   End-stage renal disease on hemodialysis: Continue HD as per schedule.  He has not missed hemodialysis.   Nephrology on board.  Dialysis per them.   Essential hypertension Stable.  Continue amlodipine.   Diabetes mellitus type 2 HbA1c in July was 8.2.  Repeat hemoglobin A1c 5.6.    At home he was taking Lantus 32 units and sliding scale.  Currently on 20 units of Lantus and SSI, blood sugar controlled.  History of stroke: Continue aspirin.   Chronic systolic CHF LVEF is 45 to 50%. Recently admitted for volume overload which corrected after hemodialysis. Appears euvolemic.    DVT prophylaxis: Lovenox Code Status: Full code Family Communication: No family at bed side Disposition Plan:   Status is: Inpatient Remains inpatient appropriate because: Admitted for hyperbilirubinemia.  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  Liver biopsy is planned for this week.  GI is on the board.    Consultants:  Gastroenterology General surgery  Procedures: HIDA scan, MRCP.  Antimicrobials:   Anti-infectives (From admission, onward)    None       Subjective:  Patient seen and examined.  He has no complaints.  Objective: Vitals:   11/06/22 1617 11/06/22 2124 11/07/22 0630 11/07/22 0818  BP: 136/70 128/68 108/63 125/67  Pulse: 87 85 79 82  Resp: 18 17 17 18   Temp: 97.6 F (36.4 C) 98.4 F (36.9 C) 98 F (36.7 C) 98 F (36.7 C)  TempSrc:  Oral  Oral  SpO2: 99% 97% 95% 92%  Weight:        Intake/Output  Summary (Last 24 hours) at 11/07/2022 1003 Last data filed at 11/06/2022 1239 Gross per 24 hour  Intake --  Output 3000 ml  Net -3000 ml    Filed Weights   11/04/22 0845 11/04/22 1259 11/06/22 0845  Weight: 103.3 kg 102.1 kg 101.7 kg    Examination:  General exam: Appears calm and comfortable  Respiratory system: Clear to auscultation. Respiratory effort normal. Cardiovascular system: S1 & S2 heard, RRR. No JVD,  murmurs, rubs, gallops or clicks. No pedal edema. Gastrointestinal system: Abdomen is nondistended, soft and nontender. No organomegaly or masses felt. Normal bowel sounds heard. Central nervous system: Alert and oriented. No focal neurological deficits. Extremities: Symmetric 5 x 5 power. Skin: No rashes, lesions or ulcers.  Psychiatry: Judgement and insight appear normal. Mood & affect appropriate.   Data Reviewed: I have personally reviewed following labs and imaging studies  CBC: Recent Labs  Lab 11/01/22 1630 11/04/22 0913 11/05/22 0243  WBC 7.5 8.1 8.2  HGB 10.0* 10.3* 10.5*  HCT 29.1* 29.8* 31.6*  MCV 86.6 87.6 89.3  PLT 374 436* 439*    Basic Metabolic Panel: Recent Labs  Lab 11/03/22 0333 11/04/22 0903 11/05/22 0243 11/06/22 0525 11/07/22 0256  NA 133* 132* 132* 132* 134*  K 4.7 4.8 4.5 4.5 4.1  CL 91* 91* 90* 91* 93*  CO2 29 27 29 28 29   GLUCOSE 71 140* 151* 121* 103*  BUN 34* 43* 27* 36* 28*  CREATININE 9.24* 10.82* 7.69* 9.88* 7.68*  CALCIUM 9.9 9.4 9.5 9.4 9.3  PHOS  --  6.2*  --   --   --     GFR: Estimated Creatinine Clearance: 12.8 mL/min (A) (by C-G formula based on SCr of 7.68 mg/dL (H)). Liver Function Tests: Recent Labs  Lab 11/02/22 0221 11/03/22 0333 11/04/22 0903 11/05/22 0243 11/06/22 0525 11/07/22 0256  AST 44* 38  --  41 39 41  ALT 32 31  --  30 30 28   ALKPHOS 293* 279*  --  246* 227* 235*  BILITOT 7.4* 7.6*  --  6.9* 6.3* 5.3*  PROT 8.6* 8.7*  --  8.4* 8.3* 7.8  ALBUMIN 2.4* 2.4* 2.2* 2.2* 2.2* 2.1*    No results for input(s): "LIPASE", "AMYLASE" in the last 168 hours.  No results for input(s): "AMMONIA" in the last 168 hours.  Coagulation Profile: Recent Labs  Lab 11/02/22 0221 11/03/22 0333 11/05/22 0243 11/06/22 0525 11/07/22 0256  INR 1.4* 1.4* 1.5* 1.5* 1.5*    Cardiac Enzymes: No results for input(s): "CKTOTAL", "CKMB", "CKMBINDEX", "TROPONINI" in the last 168 hours. BNP (last 3 results) No results for  input(s): "PROBNP" in the last 8760 hours. HbA1C: No results for input(s): "HGBA1C" in the last 72 hours. CBG: Recent Labs  Lab 11/05/22 1948 11/06/22 0717 11/06/22 1610 11/06/22 2151 11/07/22 0818  GLUCAP 104* 98 111* 111* 102*    Lipid Profile: No results for input(s): "CHOL", "HDL", "LDLCALC", "TRIG", "CHOLHDL", "LDLDIRECT" in the last 72 hours. Thyroid Function Tests: No results for input(s): "TSH", "T4TOTAL", "FREET4", "T3FREE", "THYROIDAB" in the last 72 hours. Anemia Panel: No results for input(s): "VITAMINB12", "FOLATE", "FERRITIN", "TIBC", "IRON", "RETICCTPCT" in the last 72 hours. Sepsis Labs: No results for input(s): "PROCALCITON", "LATICACIDVEN" in the last 168 hours.  No results found for this or any previous visit (from the past 240 hour(s)).   Radiology Studies: No results found.  Scheduled Meds:  amLODipine  5 mg Oral Daily   Chlorhexidine Gluconate Cloth  6 each Topical Q0600  Chlorhexidine Gluconate Cloth  6 each Topical Q0600   doxercalciferol  4 mcg Intravenous Q T,Th,Sa-HD   [START ON 11/08/2022] enoxaparin (LOVENOX) injection  30 mg Subcutaneous Q24H   insulin aspart  0-5 Units Subcutaneous QHS   insulin aspart  0-9 Units Subcutaneous TID WC   insulin glargine-yfgn  20 Units Subcutaneous Daily   sevelamer carbonate  2,400 mg Oral TID WC   Continuous Infusions:   LOS: 9 days   Darliss Cheney, MD [facility]   If 7PM-7AM, please contact night-coverage

## 2022-11-07 NOTE — Telephone Encounter (Signed)
Patient has been scheduled for a hospital follow up with Dr. Havery Moros on Thursday, 11-20-22 at 10:30 am. Appt information will be added to hospital discharge summary. Appt information also mailed and sent to patient via [place].

## 2022-11-07 NOTE — Telephone Encounter (Signed)
-----   Message from [place], DO sent at 11/07/2022  3:07 PM EST ----- I expect this patient will be discharged either today or over the weekend.  Can we start working on follow-up with Dr. Havery Moros or one of the APP's as an outpatient?  Thank you.

## 2022-11-08 LAB — COMPREHENSIVE METABOLIC PANEL
ALT: 28 U/L (ref 0–44)
AST: 42 U/L — ABNORMAL HIGH (ref 15–41)
Albumin: 2.1 g/dL — ABNORMAL LOW (ref 3.5–5.0)
Alkaline Phosphatase: 230 U/L — ABNORMAL HIGH (ref 38–126)
Anion gap: 12 (ref 5–15)
BUN: 39 mg/dL — ABNORMAL HIGH (ref 6–20)
CO2: 28 mmol/L (ref 22–32)
Calcium: 9.1 mg/dL (ref 8.9–10.3)
Chloride: 94 mmol/L — ABNORMAL LOW (ref 98–111)
Creatinine, Ser: 9.74 mg/dL — ABNORMAL HIGH (ref 0.61–1.24)
GFR, Estimated: 6 mL/min — ABNORMAL LOW (ref >60)
Glucose, Bld: 128 mg/dL — ABNORMAL HIGH (ref 70–99)
Potassium: 4.2 mmol/L (ref 3.5–5.1)
Sodium: 134 mmol/L — ABNORMAL LOW (ref 135–145)
Total Bilirubin: 5.2 mg/dL — ABNORMAL HIGH (ref 0.3–1.2)
Total Protein: 7.9 g/dL (ref 6.5–8.1)

## 2022-11-08 LAB — PROTIME-INR
INR: 1.4 — ABNORMAL HIGH (ref 0.8–1.2)
Prothrombin Time: 17.1 seconds — ABNORMAL HIGH (ref 11.4–15.2)

## 2022-11-08 LAB — GLUCOSE, CAPILLARY: Glucose-Capillary: 125 mg/dL — ABNORMAL HIGH (ref 70–99)

## 2022-11-08 MED ORDER — INSULIN GLARGINE 100 UNIT/ML ~~LOC~~ SOLN: 25.0000 [IU] | Freq: Every day | SUBCUTANEOUS | 0 refills | Status: DC

## 2022-11-08 NOTE — Procedures (Signed)
I was present at this dialysis session. I have reviewed the session itself and made appropriate changes.   Vital signs in last 24 hours:  Temp:  [97.8 F (36.6 C)-98.6 F (37 C)] 98.6 F (37 C) (12/23 0821) Pulse Rate:  [80-95] 95 (12/23 0821) Resp:  [15-21] 21 (12/23 0821) BP: (113-130)/(64-79) 130/64 (12/23 0821) SpO2:  [91 %-98 %] 94 % (12/23 0821) Weight change:  Filed Weights   11/04/22 0845 11/04/22 1259 11/06/22 0845  Weight: 103.3 kg 102.1 kg 101.7 kg    Recent Labs  Lab 11/04/22 0903 11/05/22 0243 11/07/22 0256  NA 132*   < > 134*  K 4.8   < > 4.1  CL 91*   < > 93*  CO2 27   < > 29  GLUCOSE 140*   < > 103*  BUN 43*   < > 28*  CREATININE 10.82*   < > 7.68*  CALCIUM 9.4   < > 9.3  PHOS 6.2*  --   --    < > = values in this interval not displayed.    Recent Labs  Lab 11/01/22 1630 11/04/22 0913 11/05/22 0243  WBC 7.5 8.1 8.2  HGB 10.0* 10.3* 10.5*  HCT 29.1* 29.8* 31.6*  MCV 86.6 87.6 89.3  PLT 374 436* 439*    Scheduled Meds:  amLODipine  5 mg Oral Daily   Chlorhexidine Gluconate Cloth  6 each Topical Q0600   Chlorhexidine Gluconate Cloth  6 each Topical Q0600   doxercalciferol  4 mcg Intravenous Q T,Th,Sa-HD   enoxaparin (LOVENOX) injection  30 mg Subcutaneous Q24H   insulin aspart  0-5 Units Subcutaneous QHS   insulin aspart  0-9 Units Subcutaneous TID WC   insulin glargine-yfgn  20 Units Subcutaneous Daily   sevelamer carbonate  2,400 mg Oral TID WC   Continuous Infusions: PRN Meds:.acetaminophen, diphenhydrAMINE, guaiFENesin, morphine injection   Donetta Potts,  MD 11/08/2022, 8:35 AM

## 2022-11-08 NOTE — Progress Notes (Signed)
Received patient in bed to unit.  Alert and oriented.  Informed consent signed and in chart.   Treatment initiated: 0821 Treatment completed: 1214  Patient tolerated well.  Transported back to the room  Alert, without acute distress.  Hand-off given to patient's nurse.   Access used: AVF Access issues: None  Total UF removed: 3000 Medication(s) given: Benadryl,Tylenol Post HD VS: 106/64,93%87,11 Post HD weight: 97.7kg   [facility]

## 2022-11-08 NOTE — Progress Notes (Signed)
Peripheral IV removed prior to discharge with no complications. Patient belongings given to transport. Patient voices no questions or concerns at this time.

## 2022-11-08 NOTE — Progress Notes (Signed)
Report given via phone to Claudette Laws at [employer]. All questions answered.

## 2022-11-08 NOTE — TOC Transition Note (Signed)
Transition of Care [facility]) - CM/SW Discharge Note   Patient Details  Name: [name] WITUCKI Sr. MRN: [id number] Date of Birth: [birth year]  Transition of Care [facility]) CM/SW Contact:  Amador Cunas, [place] Phone Number: 11/08/2022, 12:22 PM   Clinical Narrative:  Pt for dc after HD back to [facility]. Spoke to [place] at [facility] who confirmed pt able to return today. DC summary faxed to facility at [facility] request [phone]. RN provided with number for report and PTAR to be arranged once pt returns from HD. SW signing off at dc.   Wandra Feinstein, MSW, LCSW [phone] (coverage)        Final next level of care: Skilled Nursing Facility Barriers to Discharge: No Barriers Identified   Patient Goals and CMS Choice CMS Medicare.gov Compare Post Acute Care list provided to:: Patient Choice offered to / list presented to : NA  Discharge Placement                  Patient to be transferred to facility by: [place] Name of family member notified: [name] [name]/dtr Patient and family notified of of transfer: 11/08/22  Discharge Plan and Services Additional resources added to the After Visit Summary for     Discharge Planning Services: CM Consult Post Acute Care Choice: Resumption of Svcs/PTA Provider (Return to [facility] LTC facility once medically stable for discharge)                               Social Determinants of Health (SDOH) Interventions SDOH Screenings   Food Insecurity: No Food Insecurity (10/29/2022)  Housing: Low Risk  (10/29/2022)  Transportation Needs: No Transportation Needs (10/29/2022)  Utilities: Not At Risk (10/29/2022)  Alcohol Screen: Low Risk  (02/04/2022)  Depression (PHQ2-9): Low Risk  (08/06/2022)  Tobacco Use: Low Risk  (11/07/2022)     Readmission Risk Interventions    11/05/2022    3:08 PM  Readmission Risk Prevention Plan  Transportation Screening Complete  Medication Review ([place]) Complete  PCP or  Specialist appointment within 3-5 days of discharge Complete  HRI or [place] Complete  SW Recovery Care/Counseling Consult Complete  Palliative Care Screening Complete  Skilled Nursing Facility Complete

## 2022-11-08 NOTE — Discharge Summary (Signed)
Physician Discharge Summary  [name] Cruel Sr. YTK:[id number] DOB: 02-06-1961 DOA: 10/29/2022  PCP: Martinique, Betty G, MD  Admit date: 10/29/2022 Discharge date: 11/08/2022 30 Day Unplanned Readmission Risk Score    Flowsheet Row ED to Hosp-Admission (Current) from 10/29/2022 in [facility]  30 Day Unplanned Readmission Risk Score (%) 34.62 Filed at 11/08/2022 0800       This score is the patient's risk of an unplanned readmission within 30 days of being discharged (0 -100%). The score is based on dignosis, age, lab data, medications, orders, and past utilization.   Low:  0-14.9   Medium: 15-21.9   High: 22-29.9   Extreme: 30 and above          Admitted From: SNF Disposition: SNF  Recommendations for Outpatient Follow-up:  Follow up with PCP in 1-2 weeks Please obtain BMP/CBC in one week Follow-up with GI, appointment scheduled for 11/20/2022 Please follow up with your PCP on the following pending results: Unresulted Labs (From admission, onward)     Start     Ordered   11/01/22 0500  Protime-INR  Daily,   R      10/31/22 1255   10/30/22 1730  Hemochromatosis DNA-PCR(c282y,h63d)  Once,   R        10/30/22 1730   Signed and Held  CBC  Once,   R       Question:  Specimen collection method  Answer:  Lab=Lab collect   Signed and Held   Signed and Held  Renal function panel  Once,   R       Question:  Specimen collection method  Answer:  Lab=Lab collect   Signed and Held   Signed and Held  CBC  Once,   R       Question:  Specimen collection method  Answer:  Lab=Lab collect   Signed and Held              Home Health: None Equipment/Devices: None  Discharge Condition: Stable CODE STATUS: Full code Diet recommendation: Cardiac/renal  Subjective: Seen and examined.  No complaints.  Brief/Interim Summary: This 61 y.o. male with PMH significant of ESRD on HD TTS, HFrEF, T2DM, PVD s/p right BKA, HTN who presented with c/o: neck  pain, visual aura, abdominal pain.  No complaint of epigastric and left upper quadrant abdominal pain for the past 3 weeks. He also reports intermittent nausea, vomiting and diarrhea.  Reportedly already had ultrasound outpatient about 3 weeks ago showing gallstones and was getting workup outpatient. RUQ ultrasound showed cholelithiasis but no positive sonographic Murphy sign. No wall thickening to suggest cholecystitis. He was evaluated by general surgery who signed off.  Admitted under hospitalist service, significantly elevated LFTs were discovered, GI consulted.  Details below.   Abdominal pain with hyperbilirubinemia: MRCP does not suggest choledocholithiasis.   No evidence for cholecystitis / HIDA scan negative.  Gallstones were seen. Alkaline phosphatase is mildly elevated.  Bilirubin is significantly elevated majority of it being direct.   Noted to have mildly elevated bilirubin in July but it was normal back in April. Etiology is unclear, bilirubin eventually started improving. Hepatitis panel unremarkable. Gastroenterology consulted.  Extensive workup completed.  And eventually he also underwent liver biopsy on 11/07/2022 by IR.  GI has cleared him for discharge now that his LFTs as well as bilirubin has improved significantly and they will follow-up with his liver biopsy results as outpatient and arrange plans.  His aspirin was held  in order to obtain liver biopsy so we will resume his aspirin at time of discharge.   Visual aura/ Neck pain: Patient denies these symptoms in the past. He had a stroke earlier this year.  No history of seizures and migraine headaches.   CT head as well as EEG negative.  He has not had any complaint since several days now.   End-stage renal disease on hemodialysis: Continue HD as per schedule.     Essential hypertension Stable.  Continue amlodipine.   Diabetes mellitus type 2 HbA1c in July was 8.2.  Repeat hemoglobin A1c 5.6.    At home he was taking  Lantus 32 units and sliding scale.  He was on 20 units of long-acting insulin and blood sugar was controlled, has been discharged on 25 units of Lantus.   History of stroke: Continue aspirin.   Chronic systolic CHF LVEF is 45 to 50%. Recently admitted for volume overload which corrected after hemodialysis. Appears euvolemic.    Discharge plan was discussed with patient and/or family member and they verbalized understanding and agreed with it.  Discharge Diagnoses:  Principal Problem:   Hyperbilirubinemia Active Problems:   ESRD on dialysis ([place])   HTN (hypertension)   DM2 (diabetes mellitus, type 2) ([place])   History of CVA    Hyperkalemia   Cholelithiasis   RUQ abdominal pain    Discharge Instructions   Allergies as of 11/08/2022       Reactions   Sulfa Antibiotics Nausea And Vomiting, Other (See Comments)   "Allergic," per [facility]        Medication List     STOP taking these medications    acetaminophen 325 MG tablet Commonly known as: TYLENOL   feeding supplement (NEPRO CARB STEADY) Liqd   feeding supplement (PRO-STAT SUGAR FREE 64) Liqd   hydrOXYzine 25 MG tablet Commonly known as: ATARAX   oxyCODONE-acetaminophen 5-325 MG tablet Commonly known as: Percocet   Pro-Stat AWC Liqd       TAKE these medications    amLODipine 5 MG tablet Commonly known as: NORVASC Take 5 mg by mouth daily.   aspirin EC 81 MG tablet Take 1 tablet (81 mg total) by mouth daily. Swallow whole.   benzonatate 200 MG capsule Commonly known as: TESSALON Take 200 mg by mouth See admin instructions. Take one capsule by mouth two times a day every Tue, Thu, Sat per [facility].   Biofreeze 4 % Gel Generic drug: Menthol (Topical Analgesic) Apply 1 Application topically every 4 (four) hours as needed (for pain).   ethyl chloride spray Apply 1 Application topically Every Tuesday,Thursday,and Saturday with dialysis.   famotidine 20 MG tablet Commonly known as: PEPCID Take 20 mg by  mouth at bedtime.   gabapentin 100 MG capsule Commonly known as: NEURONTIN Take 100 mg by mouth at bedtime.   guaiFENesin 100 MG/5ML liquid Commonly known as: ROBITUSSIN Take 200 mg by mouth every 4 (four) hours as needed for cough.   guaifenesin 400 MG Tabs tablet Commonly known as: HUMIBID E Take 400 mg by mouth in the morning, at noon, and at bedtime.   insulin glargine 100 UNIT/ML injection Commonly known as: LANTUS Inject 0.25 mLs (25 Units total) into the skin at bedtime. What changed: how much to take   ipratropium-albuterol 0.5-2.5 (3) MG/3ML Soln Commonly known as: DUONEB Take 3 mLs by nebulization every 4 (four) hours as needed (for wheezing or shortness of breath).   loperamide 2 MG capsule Commonly known as: IMODIUM Take  1 capsule (2 mg total) by mouth as needed for diarrhea or loose stools. What changed: when to take this   multivitamin with minerals Tabs tablet Take 1 tablet by mouth daily.   ondansetron 4 MG tablet Commonly known as: ZOFRAN Take 4 mg by mouth every 6 (six) hours as needed for nausea or vomiting.   pantoprazole 20 MG tablet Commonly known as: PROTONIX Take 20 mg by mouth See admin instructions. Take 20mg  by mouth on mon, wed, fri and sun.   senna-docusate 8.6-50 MG tablet Commonly known as: Senokot-S Take 1 tablet by mouth at bedtime as needed for mild constipation.   sevelamer 800 MG tablet Commonly known as: RENAGEL Take 2,400 mg by mouth See admin instructions. Take 2400mg  by mouth three times daily on mon, wed, fri and sun.   sevelamer carbonate 800 MG tablet Commonly known as: RENVELA Take 2,400 mg by mouth See admin instructions. Take 2400mg  by mouth twice daily on Tues, Thurs, and Saturdays.   Simethicone 80 MG Tabs Take 160 mg by mouth every 8 (eight) hours as needed (for gas).        Contact information for follow-up providers     Martinique, Betty G, MD Follow up in 1 week(s).   Specialty: Family Medicine Contact  information: [place] [address] [phone]         Yetta Flock, MD Follow up on 11/20/2022.   Specialty: Gastroenterology Contact information: [place] [address] [phone]              Contact information for after-discharge care     Destination     [facility] .   Service: Skilled Nursing Contact information: [address] [address] [phone]                    Allergies  Allergen Reactions   Sulfa Antibiotics Nausea And Vomiting and Other (See Comments)    "Allergic," per [facility]    Consultations: GI, general surgery   Procedures/Studies: IR US Guide Bx Asp/Drain  Result Date: 11/07/2022 INDICATION: Liver dysfunction EXAM: Ultrasound-guided random liver core needle biopsy MEDICATIONS: None. ANESTHESIA/SEDATION: Moderate (conscious) sedation was employed during this procedure. A total of Versed 0.5 mg and Fentanyl 25 mcg was administered intravenously by the radiology nurse. Total intra-service moderate Sedation Time: 12 minutes. The patient's level of consciousness and vital signs were monitored continuously by radiology nursing throughout the procedure under my direct supervision. COMPLICATIONS: None immediate. PROCEDURE: Informed written consent was obtained from the patient after a thorough discussion of the procedural risks, benefits and alternatives. All questions were addressed. Maximal Sterile Barrier Technique was utilized including caps, mask, sterile gowns, sterile gloves, sterile drape, hand hygiene and skin antiseptic. A timeout was performed prior to the initiation of the procedure. The patient was placed supine on the exam table. Ultrasound of the liver demonstrated demonstrated appropriate biopsy site in the right liver lobe. Skin entry site was marked, and the overlying skin was prepped draped in the standard sterile fashion.  Local analgesia was obtained with 1% lidocaine. Using ultrasound guidance, a 17 gauge introducer needle was advanced just deep to the surface of the right liver lobe. Subsequently, core needle biopsy was performed of the right liver lobe and a random fashion using an 18 gauge core biopsy device x2 total passes. Specimens were submitted in formalin to pathology for further handling. Limited postprocedure imaging demonstrated no  hematoma. A clean dressing was placed after manual hemostasis. The patient tolerated the procedure well without immediate complication. IMPRESSION: Successful ultrasound-guided random liver core needle biopsy of the right hepatic lobe. Electronically Signed   By: Albin Felling M.D.   On: 11/07/2022 14:44   NM Hepato W/EF  Result Date: 10/30/2022 CLINICAL DATA:  Concern for cholecystitis. EXAM: NUCLEAR MEDICINE HEPATOBILIARY IMAGING WITH GALLBLADDER EF TECHNIQUE: Sequential images of the abdomen were obtained out to 60 minutes following intravenous administration of radiopharmaceutical. After oral ingestion of Ensure, gallbladder ejection fraction was determined. At 60 min, normal ejection fraction is greater than 33%. RADIOPHARMACEUTICALS:  5.4 mCi Tc-34m  Choletec IV COMPARISON:  MRI October 30, 2022 FINDINGS: Prompt uptake and biliary excretion of activity by the liver is seen. Gallbladder activity is visualized, consistent with patency of cystic duct. Biliary activity passes into small bowel, consistent with patent common bile duct. Calculated gallbladder ejection fraction is 74%. (Normal gallbladder ejection fraction with Ensure is greater than 33%.) IMPRESSION: 1.  Patent cystic and common bile ducts. 2.  Normal gallbladder ejection fraction. Electronically Signed   By: Dahlia Bailiff M.D.   On: 10/30/2022 15:40   CT HEAD WO CONTRAST (5MM)  Result Date: 10/30/2022 CLINICAL DATA:  Headache EXAM: CT HEAD WITHOUT CONTRAST CT CERVICAL SPINE WITHOUT CONTRAST TECHNIQUE:  Multidetector CT imaging of the head and cervical spine was performed following the standard protocol without intravenous contrast. Multiplanar CT image reconstructions of the cervical spine were also generated. RADIATION DOSE REDUCTION: This exam was performed according to the departmental dose-optimization program which includes automated exposure control, adjustment of the mA and/or kV according to patient size and/or use of iterative reconstruction technique. COMPARISON:  CTA head/neck 02/03/22 FINDINGS: CT HEAD FINDINGS Brain: No evidence of hemorrhage, hydrocephalus, extra-axial collection or mass lesion/mass effect. There is a chronic infarct involving the left caudate head and the left lentiform nucleus. No CT evidence for new infarct. Vascular: No hyperdense vessel or unexpected calcification. Skull: Normal. Negative for fracture or focal lesion. Sinuses/Orbits: Right lens replacement. Polypoid mucosal thickening left maxillary sinus. Other: None CT CERVICAL SPINE FINDINGS Limitations: Assessment is limited due to the degree of motion artifact. Alignment: Straightening of the normal cervical lordosis. Skull base and vertebrae: No acute fracture. No primary bone lesion or focal pathologic process. Soft tissues and spinal canal: No evidence of high-grade spinal canal stenosis. Disc levels: There are multilevel degenerative changes likely at least mild-to-moderate spinal canal narrowing at C5-C6 and C6-C7. Upper chest: Atherosclerotic calcifications of the branch vessels. Small left pleural effusion. No pneumothorax. Other: None IMPRESSION: 1. No acute intracranial abnormality. 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 2. No acute cervical spine fracture. Multilevel degenerative changes, with likely at least mild-to-moderate spinal canal narrowing at C5-C6 and C6-C7. 3. Small left pleural effusion. Electronically Signed   By: Marin Roberts M.D.   On: 10/30/2022 13:53   CT  CERVICAL SPINE WO CONTRAST  Result Date: 10/30/2022 CLINICAL DATA:  Headache EXAM: CT HEAD WITHOUT CONTRAST CT CERVICAL SPINE WITHOUT CONTRAST TECHNIQUE: Multidetector CT imaging of the head and cervical spine was performed following the standard protocol without intravenous contrast. Multiplanar CT image reconstructions of the cervical spine were also generated. RADIATION DOSE REDUCTION: This exam was performed according to the departmental dose-optimization program which includes automated exposure control, adjustment of the mA and/or kV according to patient size and/or use of iterative reconstruction technique. COMPARISON:  CTA head/neck 02/03/22 FINDINGS: CT HEAD FINDINGS Brain: No evidence  of hemorrhage, hydrocephalus, extra-axial collection or mass lesion/mass effect. There is a chronic infarct involving the left caudate head and the left lentiform nucleus. No CT evidence for new infarct. Vascular: No hyperdense vessel or unexpected calcification. Skull: Normal. Negative for fracture or focal lesion. Sinuses/Orbits: Right lens replacement. Polypoid mucosal thickening left maxillary sinus. Other: None CT CERVICAL SPINE FINDINGS Limitations: Assessment is limited due to the degree of motion artifact. Alignment: Straightening of the normal cervical lordosis. Skull base and vertebrae: No acute fracture. No primary bone lesion or focal pathologic process. Soft tissues and spinal canal: No evidence of high-grade spinal canal stenosis. Disc levels: There are multilevel degenerative changes likely at least mild-to-moderate spinal canal narrowing at C5-C6 and C6-C7. Upper chest: Atherosclerotic calcifications of the branch vessels. Small left pleural effusion. No pneumothorax. Other: None IMPRESSION: 1. No acute intracranial abnormality. 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 2. No acute cervical spine fracture. Multilevel degenerative changes, with likely at least  mild-to-moderate spinal canal narrowing at C5-C6 and C6-C7. 3. Small left pleural effusion. Electronically Signed   By: Marin Roberts M.D.   On: 10/30/2022 13:53   EEG adult  Result Date: 10/30/2022 Derek Jack, MD     10/30/2022 12:47 PM Routine EEG Report [name] GOWDY Sr. is a 61 y.o. male with a history of altered mental status with visual auras who is undergoing an EEG to evaluate for seizures. Report: This EEG was acquired with electrodes placed according to the International 10-20 electrode system (including Fp1, Fp2, F3, F4, C3, C4, P3, P4, O1, O2, T3, T4, T5, T6, A1, A2, Fz, Cz, Pz). The following electrodes were missing or displaced: none. The occipital dominant rhythm was 8.5 Hz. This activity is reactive to stimulation. Drowsiness was manifested by background fragmentation; deeper stages of sleep were identified by K complexes and sleep spindles. There was no focal slowing. There were no interictal epileptiform discharges. There were no electrographic seizures identified. Photic stimulation and hyperventilation were not performed. Impression: This EEG was obtained while awake and asleep and is normal.   Clinical Correlation: Normal EEGs, however, do not rule out epilepsy. [name], MD [facility] [phone] If 7pm- 7am, please page neurology on call as listed in [place].   MR ABDOMEN MRCP WO CONTRAST  Result Date: 10/30/2022 CLINICAL DATA:  61 year old male with suspected biliary obstruction. EXAM: MRI ABDOMEN WITHOUT CONTRAST  (INCLUDING MRCP) TECHNIQUE: Multiplanar multisequence MR imaging of the abdomen was performed. Heavily T2-weighted images of the biliary and pancreatic ducts were obtained, and three-dimensional MRCP images were rendered by post processing. COMPARISON:  No prior abdominal MRI. Abdominal ultrasound 10/29/2022. FINDINGS: Comment: Today's study is limited for detection and characterization of visceral and/or vascular lesions by lack of IV gadolinium.  Lower chest: Small left pleural effusion lying dependently. Cardiomegaly. Small amount of pericardial fluid and thickening. Hepatobiliary: Diffuse low signal intensity throughout the hepatic parenchyma on in phase dual echo images, and diffuse low signal intensity on T2 weighted sequences, indicative of a background of severe iron deposition. No discrete cystic or solid hepatic lesions are confidently identified on today's noncontrast examination. No intra or extrahepatic biliary ductal dilatation noted on MRCP images. Common bile duct measures 4 mm in the porta hepatis. No filling defect in the common bile duct to suggest choledocholithiasis. There are filling defects within the gallbladder measuring up to 1.5 cm in diameter, indicative of gallstones. Gallbladder is largely decompressed. Gallbladder wall appears mildly thickened and edematous, but there  is no substantial volume of pericholecystic fluid. Pancreas: No definite pancreatic mass or peripancreatic fluid collections or inflammatory changes noted on today's noncontrast examination. No pancreatic ductal dilatation noted on MRCP images. Spleen: Diffuse loss of signal intensity throughout the spleen on in phase dual echo images and T2 weighted images indicative of iron deposition. Adrenals/Urinary Tract: Multiple subcentimeter T1 hypointense, T2 hyperintense lesions scattered throughout both kidneys, incompletely characterized on today's noncontrast examination, but statistically likely to represent small cysts (no imaging follow-up recommended). No hydroureteronephrosis in the visualized portions of the abdomen. Bilateral adrenal glands are normal in appearance. Stomach/Bowel: Visualized portions are unremarkable. Vascular/Lymphatic: No aneurysm identified in the visualized abdominal vasculature. No lymphadenopathy noted in the abdomen. Other:  No significant volume of ascites. Musculoskeletal: No aggressive appearing osseous lesions are noted in the  visualized portions of the skeleton. IMPRESSION: 1. Study is positive for cholelithiasis. While there is some mild gallbladder wall thickening and edema, the gallbladder is nearly completely decompressed. These findings in conjunction with the recent ultrasound examination do not support a diagnosis of acute cholecystitis at this time. 2. No evidence of choledocholithiasis or biliary tract obstruction. 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 Electronically Signed   By: Vinnie Langton M.D.   On: 10/30/2022 05:27   DG Shoulder Left  Result Date: 10/29/2022 CLINICAL DATA:  Left shoulder pain no injury EXAM: LEFT SHOULDER - 2+ VIEW COMPARISON:  Left shoulder 02/06/2008 FINDINGS: There is no evidence of fracture or dislocation. There is no evidence of arthropathy or other focal bone abnormality. Vascular stent overlying the proximal humerus. IMPRESSION: Negative. Electronically Signed   By: Franchot Gallo M.D.   On: 10/29/2022 16:07   US Abdomen Limited RUQ (LIVER/GB)  Result Date: 10/29/2022 CLINICAL DATA:  Right upper quadrant pain for 1 week. EXAM: ULTRASOUND ABDOMEN LIMITED RIGHT UPPER QUADRANT COMPARISON:  None Available. FINDINGS: Gallbladder: Shadowing echogenic stones measure up to 1.5 cm. Associated wall thickening, 8 mm. Negative sonographic Murphy sign. No pericholecystic fluid. Common bile duct: Diameter: Approximately 3 mm, within normal limits. No intrahepatic biliary ductal dilatation. Liver: Parenchymal echogenicity is within normal limits. No definite focal lesion. Portal vein is patent on color Doppler imaging with normal direction of blood flow towards the liver. Other: None. IMPRESSION: Cholelithiasis. Associated wall thickening without a positive sonographic Percell Miller sign can be seen in the setting of chronic cholecystitis. Electronically Signed   By: Lorin Picket M.D.   On: 10/29/2022 15:26   CT CHEST WO CONTRAST  Result Date: 10/19/2022 CLINICAL DATA:  Possible complicated  pneumonia. EXAM: CT CHEST WITHOUT CONTRAST TECHNIQUE: Multidetector CT imaging of the chest was performed following the standard protocol without IV contrast. RADIATION DOSE REDUCTION: This exam was performed according to the departmental dose-optimization program which includes automated exposure control, adjustment of the mA and/or kV according to patient size and/or use of iterative reconstruction technique. COMPARISON:  Abdominal CT 08/05/2019 and chest x-ray 10/19/2022 FINDINGS: Cardiovascular: Mild cardiomegaly. Small pericardial effusion is present. Calcified plaque over the left main and 3 vessel coronary arteries. Mild calcification the region the mitral valve annulus and aortic valve. Thoracic aorta is normal in caliber, although there is minimal ill definition of the ascending thoracic aortic wall which may be due to the adjacent tracking pericardial fluid. There is calcified plaque over the thoracic aorta. Calcified plaque over the descending thoracic aorta. Remaining mediastinal structures are unremarkable. Mediastinum/Nodes: No mediastinal or hilar adenopathy. Lungs/Pleura: Small left pleural effusion with associated basilar atelectasis. Mild compressive atelectasis over the  right base with possible tiny amount of associated pleural fluid. Remainder of the lungs are clear. Airways are unremarkable. Upper Abdomen: Calcified plaque over the abdominal aorta. Minimal fluid over the left upper quadrant adjacent the spleen. 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 IMPRESSION: 1. Mild cardiomegaly with small pericardial effusion which is new compared to the prior exam. Slight ill definition of the ascending thoracic aorta which is otherwise normal in caliber. If clinical concern for aortic root integrity or aortic dissection, consider contrast-enhanced CT for further evaluation. 2. Small left pleural effusion with associated basilar atelectasis. Mild compressive atelectasis over the right  base with possible tiny amount of associated pleural fluid. 3. Minimal free fluid over the left upper quadrant adjacent the spleen. 4. Aortic atherosclerosis. Atherosclerotic coronary artery disease. 5. 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 Aortic Atherosclerosis (ICD10-I70.0). These results will be called to the ordering clinician or representative by the Radiologist Assistant, and communication documented in the PACS or [employer]. Electronically Signed   By: Marin Olp M.D.   On: 10/19/2022 11:35   DG Chest 1 View  Result Date: 10/19/2022 CLINICAL DATA:  CHF EXAM: CHEST  1 VIEW COMPARISON:  Yesterday FINDINGS: Cardiopericardial enlargement with congested central vessels. Possible small left pleural effusion. No consolidation or air leak. Artifact from EKG leads. IMPRESSION: Mildly improved aeration at the right base. Similar degree of vascular congestion. Electronically Signed   By: Jorje Guild M.D.   On: 10/19/2022 07:42   DG Chest 2 View  Result Date: 10/18/2022 CLINICAL DATA:  Chest pain EXAM: CHEST - 2 VIEW COMPARISON:  07/01/2022 FINDINGS: Stable cardiomegaly. Pulmonary vascular congestion with diffuse interstitial prominence. Trace bilateral pleural effusions. Patchy bibasilar opacities. No pneumothorax. IMPRESSION: 1. Cardiomegaly with pulmonary vascular congestion and interstitial edema. 2. Trace bilateral pleural effusions. Bibasilar opacities may reflect atelectasis or pneumonia. Electronically Signed   By: Davina Poke D.O.   On: 10/18/2022 12:23     Discharge Exam: Vitals:   11/08/22 0932 11/08/22 1000  BP: 121/78 120/77  Pulse: 90 89  Resp: (!) 22 (!) 22  Temp:    SpO2: 93% 94%   Vitals:   11/08/22 0900 11/08/22 0930 11/08/22 0932 11/08/22 1000  BP: 117/69 121/78 121/78 120/77  Pulse: 94 91 90 89  Resp: (!) 27 (!) 22 (!) 22 (!) 22  Temp:      TempSrc:      SpO2: 92% 91% 93% 94%  Weight:        General: Pt is alert, awake, not in acute  distress Cardiovascular: RRR, S1/S2 +, no rubs, no gallops Respiratory: CTA bilaterally, no wheezing, no rhonchi Abdominal: Soft, NT, ND, bowel sounds + Extremities: no edema, no cyanosis    The results of significant diagnostics from this hospitalization (including imaging, microbiology, ancillary and laboratory) are listed below for reference.     Microbiology: No results found for this or any previous visit (from the past 240 hour(s)).   Labs: BNP (last 3 results) Recent Labs    07/01/22 1344  BNP 1,660.6*   Basic Metabolic Panel: Recent Labs  Lab 11/04/22 0903 11/05/22 0243 11/06/22 0525 11/07/22 0256 11/08/22 0826  NA 132* 132* 132* 134* 134*  K 4.8 4.5 4.5 4.1 4.2  CL 91* 90* 91* 93* 94*  CO2 27 29 28 29 28   GLUCOSE 140* 151* 121* 103* 128*  BUN 43* 27* 36* 28* 39*  CREATININE 10.82* 7.69* 9.88* 7.68* 9.74*  CALCIUM 9.4 9.5 9.4 9.3 9.1  PHOS 6.2*  --   --   --   --  Liver Function Tests: Recent Labs  Lab 11/03/22 0333 11/04/22 0903 11/05/22 0243 11/06/22 0525 11/07/22 0256 11/08/22 0826  AST 38  --  41 39 41 42*  ALT 31  --  30 30 28 28   ALKPHOS 279*  --  246* 227* 235* 230*  BILITOT 7.6*  --  6.9* 6.3* 5.3* 5.2*  PROT 8.7*  --  8.4* 8.3* 7.8 7.9  ALBUMIN 2.4* 2.2* 2.2* 2.2* 2.1* 2.1*   No results for input(s): "LIPASE", "AMYLASE" in the last 168 hours. No results for input(s): "AMMONIA" in the last 168 hours. CBC: Recent Labs  Lab 11/01/22 1630 11/04/22 0913 11/05/22 0243  WBC 7.5 8.1 8.2  HGB 10.0* 10.3* 10.5*  HCT 29.1* 29.8* 31.6*  MCV 86.6 87.6 89.3  PLT 374 436* 439*   Cardiac Enzymes: No results for input(s): "CKTOTAL", "CKMB", "CKMBINDEX", "TROPONINI" in the last 168 hours. BNP: Invalid input(s): "POCBNP" CBG: Recent Labs  Lab 11/06/22 2151 11/07/22 0818 11/07/22 1211 11/07/22 1605 11/07/22 2107  GLUCAP 111* 102* 99 104* 128*   D-Dimer No results for input(s): "DDIMER" in the last 72 hours. Hgb A1c No results for  input(s): "HGBA1C" in the last 72 hours. Lipid Profile No results for input(s): "CHOL", "HDL", "LDLCALC", "TRIG", "CHOLHDL", "LDLDIRECT" in the last 72 hours. Thyroid function studies No results for input(s): "TSH", "T4TOTAL", "T3FREE", "THYROIDAB" in the last 72 hours.  Invalid input(s): "FREET3" Anemia work up No results for input(s): "VITAMINB12", "FOLATE", "FERRITIN", "TIBC", "IRON", "RETICCTPCT" in the last 72 hours. Urinalysis    Component Value Date/Time   COLORURINE AMBER (A) 07/01/2022 1143   APPEARANCEUR CLEAR 07/01/2022 1143   LABSPEC 1.017 07/01/2022 1143   PHURINE 7.0 07/01/2022 1143   GLUCOSEU NEGATIVE 07/01/2022 1143   HGBUR NEGATIVE 07/01/2022 1143   BILIRUBINUR NEGATIVE 07/01/2022 1143   KETONESUR NEGATIVE 07/01/2022 1143   PROTEINUR >=300 (A) 07/01/2022 1143   NITRITE NEGATIVE 07/01/2022 1143   LEUKOCYTESUR NEGATIVE 07/01/2022 1143   Sepsis Labs Recent Labs  Lab 11/01/22 1630 11/04/22 0913 11/05/22 0243  WBC 7.5 8.1 8.2   Microbiology No results found for this or any previous visit (from the past 240 hour(s)).   Time coordinating discharge: Over 30 minutes  SIGNED:   Darliss Cheney, MD  [facility] 11/08/2022, 10:21 AM *Please note that this is a verbal dictation therefore any spelling or grammatical errors are due to the "[place] [employer]" system interpretation. If 7PM-7AM, please contact night-coverage [email or web address]

## 2022-11-11 NOTE — Progress Notes (Signed)
Late Entry Note:  Pt was d/c back to snf on Saturday. Contacted [facility] this morning to advise clinic of pt's d/c and that pt should resume care today.   Melven Sartorius Renal Navigator [phone]

## 2022-11-19 LAB — HEMOCHROMATOSIS DNA-PCR(C282Y,H63D)

## 2022-11-20 ENCOUNTER — Encounter: Payer: Self-pay | Admitting: Gastroenterology

## 2022-11-20 ENCOUNTER — Other Ambulatory Visit: Payer: Self-pay

## 2022-11-20 ENCOUNTER — Other Ambulatory Visit (INDEPENDENT_AMBULATORY_CARE_PROVIDER_SITE_OTHER): Payer: Medicare Other

## 2022-11-20 ENCOUNTER — Telehealth: Payer: Self-pay

## 2022-11-20 ENCOUNTER — Ambulatory Visit (INDEPENDENT_AMBULATORY_CARE_PROVIDER_SITE_OTHER): Payer: Medicare Other | Admitting: Gastroenterology

## 2022-11-20 VITALS — BP 140/76 | HR 82

## 2022-11-20 DIAGNOSIS — Z1211 Encounter for screening for malignant neoplasm of colon: Secondary | ICD-10-CM | POA: Diagnosis not present

## 2022-11-20 DIAGNOSIS — R748 Abnormal levels of other serum enzymes: Secondary | ICD-10-CM

## 2022-11-20 DIAGNOSIS — R768 Other specified abnormal immunological findings in serum: Secondary | ICD-10-CM

## 2022-11-20 LAB — HEPATIC FUNCTION PANEL
ALT: 19 U/L (ref 0–53)
AST: 25 U/L (ref 0–37)
Albumin: 3.2 g/dL — ABNORMAL LOW (ref 3.5–5.2)
Alkaline Phosphatase: 268 U/L — ABNORMAL HIGH (ref 39–117)
Bilirubin, Direct: 2 mg/dL — ABNORMAL HIGH (ref 0.0–0.3)
Total Bilirubin: 3.3 mg/dL — ABNORMAL HIGH (ref 0.2–1.2)
Total Protein: 8.2 g/dL (ref 6.0–8.3)

## 2022-11-20 LAB — PROTIME-INR
INR: 1.5 ratio — ABNORMAL HIGH (ref 0.8–1.0)
Prothrombin Time: 15.8 s — ABNORMAL HIGH (ref 9.6–13.1)

## 2022-11-20 NOTE — Patient Instructions (Addendum)
If you are age 61 or older, your body mass index should be between 23-30. Your There is no height or weight on file to calculate BMI. If this is out of the aforementioned range listed, please consider follow up with your Primary Care Provider.  If you are age 18 or younger, your body mass index should be between 19-25. Your There is no height or weight on file to calculate BMI. If this is out of the aformentioned range listed, please consider follow up with your Primary Care Provider.   ________________________________________________________   Please go to the lab in the basement of our building to have lab work done as you leave today. Hit "B" for basement when you get on the elevator.  When the doors open the lab is on your left.  We will call you with the results. Thank you.   We are referring you to [facility] (Dr. Zollie Scale Dawn Drazek).  They will contact you directly to schedule an appointment.  It may take a week or more before you hear from them.  Please feel free to contact us if you have not heard from them within 2 weeks and we will follow up on the referral.    Thank you for entrusting me with your care and for choosing [facility], Dr. Sherwood Cellar

## 2022-11-20 NOTE — Progress Notes (Signed)
HPI : 62 year old male here for a post hospital follow-up.  I saw him when we were consulted for elevation in his liver enzymes.  See hospital intake note for full details.  Recall he has a history of CVA, end-stage renal disease on HD, CHF, PVD status post right BKA.  He presented with abdominal pain and elevated liver enzymes.  He underwent initially evaluation with an ultrasound which showed some gallstones in his gallbladder but no ductal dilation.  Given his elevated liver enzymes and concern for potential choledocholithiasis he underwent an MRCP which was negative for choledocholithiasis, only gallstones in the gallbladder.  There was some thickening of his gallbladder concerning for possible cholecystitis.  He subsequently had a HIDA scan which was normal with a patent cystic and common bile duct.  Liver enzymes were elevated with the bilirubin of 7.6 at peak with an alk phos of 279, normal AST and ALT.  This persisted for several days.  He had a serologic workup sent as outlined below.  Ferritin was markedly elevated however hemochromatosis genetic testing negative.  Nothing came back positive during that evaluation at the time of hospitalization however his IgG level returned elevated as well as elevated IgG4 level.  He had a liver biopsy done on December 22 as below.  He denied any changes in medications leading up to hospitalization with exception of having some Augmentin a few months ago.  He did have elevated liver enzymes back in August and had not had any follow-up since then, we were not sure if he had acute on chronic elevation or not.  He states he is feeling okay since his hospitalization.  He has no abdominal pains.  He is eating well.  He again denies any family history of liver disease.  Does not drink alcohol.  No new medications.  His bilirubin down trended to 5.2 at time of discharge from the hospital.  Otherwise he states his bowel habits are regular.  He has never had a  colonoscopy.  No family history of colon cancer.   Labs: Acute hep panel negative Monospot negative  AMA negative SMA negative IgG4 109 (mildly elevated) IgG 2871 ANA negative Iron 46, iron sat 27% Ferritin 1410 Hemochromatosis genetic testing negative    Liver biopsy 11/07/22: FINAL MICROSCOPIC DIAGNOSIS:  A. LIVER, RIGHT LOBE, BIOPSY: Acute hepatitis with cholestasis, portal fibrosis and increased stainable iron  COMMENT: Sections show multiple cores of hepatic parenchyma with sinusoidal and disarray and a patchy acute inflammatory cell infiltrate with associated cholate stasis and feathery degeneration.  The portal triads show a fibrous expansion with bile ductular proliferation and a negligible chronic inflammatory cell infiltrate.  No acidophil bodies or granulomas are seen.  A trichrome stain with adequate control demonstrates sinusoidal and pericellular fibrosis and accentuates of the bile ductular proliferation within the portal triads with associated portal fibrosis.  Reticulin stain with adequate control shows similar features. No sinusoidal is seen.  An iron stain with adequate control shows increased stainable iron predominantly Kupffer cell iron.   A PAS stain with diastase shows scattered intracellular small granules.  No alpha-1 antitrypsin globules are seen.  Despite the negative serologies, the differential diagnosis for these changes remains broad and includes a drug effect, large duct obstruction, sepsis and cardiogenic stasis.  The increased stainable iron is in a mixed pattern suggestive of cardiogenic etiology; however, it is nonspecific and also may be secondary to chronic transfusions.  No tumor is seen.   Lab Results  Component Value  Date   ALT 28 11/08/2022   AST 42 (H) 11/08/2022   GGT 293 (H) 10/31/2022   ALKPHOS 230 (H) 11/08/2022   BILITOT 5.2 (H) 11/08/2022    Echo 02/05/22: EF 45-50%, mild AS, mild MR   Past Medical History:   Diagnosis Date   CHF (congestive heart failure) ([place])    ECHO 02/05/22  EF 45-50%, DD indeterminent   Diabetes mellitus without complication ([place])    type 2   ESRD on hemodialysis ([place])    mon wed fri   Hypertension    Kidney failure    Myocardial infarction ([place])    Nonischemic cardiomyopathy [facility])      Past Surgical History:  Procedure Laterality Date   A/V FISTULAGRAM N/A 08/15/2019   Procedure: A/V FISTULAGRAM;  Surgeon: [name], MD;  Location: [place] [facility];  Service: Cardiovascular;  Laterality: N/A;   AMPUTATION Right 02/02/2022   Procedure: AMPUTATION 5th RAY;  Surgeon: [name], MD;  Location: [place];  Service: Podiatry;  Laterality: Right;   AMPUTATION Right 05/23/2022   Procedure: RIGHT BELOW KNEE AMPUTATION;  Surgeon: [name], MD;  Location: [place];  Service: Orthopedics;  Laterality: Right;   AMPUTATION Right 07/04/2022   Procedure: REVISION AMPUTATION BELOW KNEE;  Surgeon: [name], MD;  Location: [place];  Service: Orthopedics;  Laterality: Right;   APPLICATION OF WOUND VAC  05/23/2022   Procedure: APPLICATION OF WOUND VAC;  Surgeon: [name], MD;  Location: [place];  Service: Orthopedics;;   AV FISTULA PLACEMENT     CARDIAC CATHETERIZATION  02/17/2018   LHC 02/17/18 ([name]): 30% pLAD, 30% dRCA, LVEDP 25-30, LVEF 25%.     CATARACT EXTRACTION W/PHACO Right 03/29/2020   Procedure: CATARACT EXTRACTION PHACO AND INTRAOCULAR LENS PLACEMENT ([place])  RIGHT DIABETIC;  Surgeon: [name], MD;  Location: [facility];  Service: Ophthalmology;  Laterality: Right;  Lot # [id number] H [place]: 02:30.9 AP% 9.5% CDE: 14.38   EYE SURGERY Right    cataract removed   I & D EXTREMITY Right 02/02/2022   Procedure: IRRIGATION AND DEBRIDEMENT RIGHT FOOT;  Surgeon: [name], MD;  Location: [place];  Service: Podiatry;  Laterality: Right;   INCISION AND DRAINAGE OF WOUND Right 02/08/2022   Procedure: IRRIGATION AND DEBRIDEMENT WOUND;   Surgeon: [name], DPM;  Location: [place];  Service: Podiatry;  Laterality: Right;  with removal of infected bone    INSERTION OF DIALYSIS CATHETER  08/13/2019   Procedure: Insertion Of Dialysis Catheter;  Surgeon: [name], MD;  Location: [place];  Service: Vascular;;   INSERTION OF DIALYSIS CATHETER Left 07/19/2020   Procedure: INSERTION OF DIALYSIS CATHETER;  Surgeon: [name], MD;  Location: [place];  Service: Vascular;  Laterality: Left;   IR US GUIDE BX ASP/DRAIN  11/07/2022   IRRIGATION AND DEBRIDEMENT ABSCESS     PERIPHERAL VASCULAR BALLOON ANGIOPLASTY  08/15/2019   Procedure: PERIPHERAL VASCULAR BALLOON ANGIOPLASTY;  Surgeon: [name], MD;  Location: [place] [facility];  Service: Cardiovascular;;   PERIPHERAL VASCULAR BALLOON ANGIOPLASTY Left 02/06/2022   Procedure: PERIPHERAL VASCULAR BALLOON ANGIOPLASTY;  Surgeon: [name], MD;  Location: [place] [facility];  Service: Cardiovascular;  Laterality: Left;   REVISON OF ARTERIOVENOUS FISTULA Left 07/19/2020   Procedure: REVISON/PLICATION OF ARTERIOVENOUS FISTULA LEFT;  Surgeon: [name], MD;  Location: [place];  Service: Vascular;  Laterality: Left;   THROMBECTOMY AND REVISION OF ARTERIOVENTOUS (AV) GORETEX  GRAFT Left 08/13/2019  Procedure: THROMBECTOMY OF LEFT ARM ARTERIOVENTOUS (AV) FISTULA;  Surgeon: [name], MD;  [facility];  Service: Vascular;  Laterality: Left;   TOE AMPUTATION     Family History  Problem Relation Age of Onset   Hypertension Mother    Social History   Tobacco Use   Smoking status: Never   Smokeless tobacco: Never  Vaping Use   Vaping Use: Never used  Substance Use Topics   Alcohol use: Not Currently   Drug use: Never   Current Outpatient Medications  Medication Sig Dispense Refill   amLODipine (NORVASC) 5 MG tablet Take 5 mg by mouth daily.     aspirin EC 81 MG EC tablet Take 1 tablet (81 mg total) by mouth daily. Swallow whole. 30  tablet 11   benzonatate (TESSALON) 200 MG capsule Take 200 mg by mouth See admin instructions. Take one capsule by mouth two times a day every Tue, Thu, Sat per [facility].     ethyl chloride spray Apply 1 Application topically Every Tuesday,Thursday,and Saturday with dialysis.     famotidine (PEPCID) 20 MG tablet Take 20 mg by mouth at bedtime.     gabapentin (NEURONTIN) 100 MG capsule Take 100 mg by mouth at bedtime.     guaifenesin (HUMIBID E) 400 MG TABS tablet Take 400 mg by mouth in the morning, at noon, and at bedtime.     guaiFENesin (ROBITUSSIN) 100 MG/5ML liquid Take 200 mg by mouth every 4 (four) hours as needed for cough.     insulin glargine (LANTUS) 100 UNIT/ML injection Inject 0.25 mLs (25 Units total) into the skin at bedtime. 10 mL 0   ipratropium-albuterol (DUONEB) 0.5-2.5 (3) MG/3ML SOLN Take 3 mLs by nebulization every 4 (four) hours as needed (for wheezing or shortness of breath).     loperamide (IMODIUM) 2 MG capsule Take 1 capsule (2 mg total) by mouth as needed for diarrhea or loose stools. (Patient taking differently: Take 2 mg by mouth daily as needed for diarrhea or loose stools.) 30 capsule 0   Menthol, Topical Analgesic, (BIOFREEZE) 4 % GEL Apply 1 Application topically every 4 (four) hours as needed (for pain).     Multiple Vitamin (MULTIVITAMIN WITH MINERALS) TABS tablet Take 1 tablet by mouth daily.     ondansetron (ZOFRAN) 4 MG tablet Take 4 mg by mouth every 6 (six) hours as needed for nausea or vomiting.     pantoprazole (PROTONIX) 20 MG tablet Take 20 mg by mouth See admin instructions. Take 17m by mouth on mon, wed, fri and sun.     senna-docusate (SENOKOT-S) 8.6-50 MG tablet Take 1 tablet by mouth at bedtime as needed for mild constipation.     sevelamer (RENAGEL) 800 MG tablet Take 2,400 mg by mouth See admin instructions. Take 24039mby mouth three times daily on mon, wed, fri and sun.     sevelamer carbonate (RENVELA) 800 MG tablet Take 2,400 mg by mouth See admin  instructions. Take 240040my mouth twice daily on Tues, Thurs, and Saturdays.     Simethicone 80 MG TABS Take 160 mg by mouth every 8 (eight) hours as needed (for gas).     No current facility-administered medications for this visit.   Allergies  Allergen Reactions   Sulfa Antibiotics Nausea And Vomiting and Other (See Comments)    "Allergic," [facility]  Review of Systems: All systems reviewed and negative except where noted in HPI.    IR US [place]ide Bx  Asp/Drain  Result Date: 11/07/2022 INDICATION: Liver dysfunction EXAM: Ultrasound-guided random liver core needle biopsy MEDICATIONS: None. ANESTHESIA/SEDATION: Moderate (conscious) sedation was employed during this procedure. A total of Versed 0.5 mg and Fentanyl 25 mcg was administered intravenously by the radiology nurse. Total intra-service moderate Sedation Time: 12 minutes. The patient's level of consciousness and vital signs were monitored continuously by radiology nursing throughout the procedure under my direct supervision. COMPLICATIONS: None immediate. PROCEDURE: Informed written consent was obtained from the patient after a thorough discussion of the procedural risks, benefits and alternatives. All questions were addressed. Maximal Sterile Barrier Technique was utilized including caps, mask, sterile gowns, sterile gloves, sterile drape, hand hygiene and skin antiseptic. A timeout was performed prior to the initiation of the procedure. The patient was placed supine on the exam table. Ultrasound of the liver demonstrated demonstrated appropriate biopsy site in the right liver lobe. Skin entry site was marked, and the overlying skin was prepped draped in the standard sterile fashion. Local analgesia was obtained with 1% lidocaine. Using ultrasound guidance, a 17 gauge introducer needle was advanced just deep to the surface of the right liver lobe. Subsequently, core needle biopsy was performed of the right liver lobe and a random fashion  using an 18 gauge core biopsy device x2 total passes. Specimens were submitted in formalin to pathology for further handling. Limited postprocedure imaging demonstrated no hematoma. A clean dressing was placed after manual hemostasis. The patient tolerated the procedure well without immediate complication. IMPRESSION: Successful ultrasound-guided random liver core needle biopsy of the right hepatic lobe. Electronically Signed   By: Albin Felling M.D.   On: 11/07/2022 14:44   NM Hepato W/EF  Result Date: 10/30/2022 CLINICAL DATA:  Concern for cholecystitis. EXAM: NUCLEAR MEDICINE HEPATOBILIARY IMAGING WITH GALLBLADDER EF TECHNIQUE: Sequential images of the abdomen were obtained out to 60 minutes following intravenous administration of radiopharmaceutical. After oral ingestion of Ensure, gallbladder ejection fraction was determined. At 60 min, normal ejection fraction is greater than 33%. RADIOPHARMACEUTICALS:  5.4 mCi Tc-55m Choletec IV COMPARISON:  MRI October 30, 2022 FINDINGS: Prompt uptake and biliary excretion of activity by the liver is seen. Gallbladder activity is visualized, consistent with patency of cystic duct. Biliary activity passes into small bowel, consistent with patent common bile duct. Calculated gallbladder ejection fraction is 74%. (Normal gallbladder ejection fraction with Ensure is greater than 33%.) IMPRESSION: 1.  Patent cystic and common bile ducts. 2.  Normal gallbladder ejection fraction. Electronically Signed   By: JDahlia BailiffM.D.   On: 10/30/2022 15:40   CT HEAD WO CONTRAST (5MM)  Result Date: 10/30/2022 CLINICAL DATA:  Headache EXAM: CT HEAD WITHOUT CONTRAST CT CERVICAL SPINE WITHOUT CONTRAST TECHNIQUE: Multidetector CT imaging of the head and cervical spine was performed following the standard protocol without intravenous contrast. Multiplanar CT image reconstructions of the cervical spine were also generated. RADIATION DOSE REDUCTION: This exam was performed according  to the departmental dose-optimization program which includes automated exposure control, adjustment of the mA and/or kV according to patient size and/or use of iterative reconstruction technique. COMPARISON:  CTA head/neck 02/03/22 FINDINGS: CT HEAD FINDINGS Brain: No evidence of hemorrhage, hydrocephalus, extra-axial collection or mass lesion/mass effect. There is a chronic infarct involving the left caudate head and the left lentiform nucleus. No CT evidence for new infarct. Vascular: No hyperdense vessel or unexpected calcification. Skull: Normal. Negative for fracture or focal lesion. Sinuses/Orbits: Right lens replacement. Polypoid mucosal thickening left maxillary sinus. Other: None CT CERVICAL SPINE FINDINGS Limitations:  Assessment is limited due to the degree of motion artifact. Alignment: Straightening of the normal cervical lordosis. Skull base and vertebrae: No acute fracture. No primary bone lesion or focal pathologic process. Soft tissues and spinal canal: No evidence of high-grade spinal canal stenosis. Disc levels: There are multilevel degenerative changes likely at least mild-to-moderate spinal canal narrowing at C5-C6 and C6-C7. Upper chest: Atherosclerotic calcifications of the branch vessels. Small left pleural effusion. No pneumothorax. Other: None IMPRESSION: 1. No acute intracranial abnormality. 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 2. No acute cervical spine fracture. Multilevel degenerative changes, with likely at least mild-to-moderate spinal canal narrowing at C5-C6 and C6-C7. 3. Small left pleural effusion. Electronically Signed   By: Marin Roberts M.D.   On: 10/30/2022 13:53   CT CERVICAL SPINE WO CONTRAST  Result Date: 10/30/2022 CLINICAL DATA:  Headache EXAM: CT HEAD WITHOUT CONTRAST CT CERVICAL SPINE WITHOUT CONTRAST TECHNIQUE: Multidetector CT imaging of the head and cervical spine was performed following the standard protocol without  intravenous contrast. Multiplanar CT image reconstructions of the cervical spine were also generated. RADIATION DOSE REDUCTION: This exam was performed according to the departmental dose-optimization program which includes automated exposure control, adjustment of the mA and/or kV according to patient size and/or use of iterative reconstruction technique. COMPARISON:  CTA head/neck 02/03/22 FINDINGS: CT HEAD FINDINGS Brain: No evidence of hemorrhage, hydrocephalus, extra-axial collection or mass lesion/mass effect. There is a chronic infarct involving the left caudate head and the left lentiform nucleus. No CT evidence for new infarct. Vascular: No hyperdense vessel or unexpected calcification. Skull: Normal. Negative for fracture or focal lesion. Sinuses/Orbits: Right lens replacement. Polypoid mucosal thickening left maxillary sinus. Other: None CT CERVICAL SPINE FINDINGS Limitations: Assessment is limited due to the degree of motion artifact. Alignment: Straightening of the normal cervical lordosis. Skull base and vertebrae: No acute fracture. No primary bone lesion or focal pathologic process. Soft tissues and spinal canal: No evidence of high-grade spinal canal stenosis. Disc levels: There are multilevel degenerative changes likely at least mild-to-moderate spinal canal narrowing at C5-C6 and C6-C7. Upper chest: Atherosclerotic calcifications of the branch vessels. Small left pleural effusion. No pneumothorax. Other: None IMPRESSION: 1. No acute intracranial abnormality. 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 2. No acute cervical spine fracture. Multilevel degenerative changes, with likely at least mild-to-moderate spinal canal narrowing at C5-C6 and C6-C7. 3. Small left pleural effusion. Electronically Signed   By: Marin Roberts M.D.   On: 10/30/2022 13:53   EEG adult  Result Date: 10/30/2022 Derek Jack, MD     10/30/2022 12:47 PM Routine EEG Report BON DOWIS  Sr. is a 62 y.o. male with a history of altered mental status with visual auras who is undergoing an EEG to evaluate for seizures. Report: This EEG was acquired with electrodes placed according to the International 10-20 electrode system (including Fp1, Fp2, F3, F4, C3, C4, P3, P4, O1, O2, T3, T4, T5, T6, A1, A2, Fz, Cz, Pz). The following electrodes were missing or displaced: none. The occipital dominant rhythm was 8.5 Hz. This activity is reactive to stimulation. Drowsiness was manifested by background fragmentation; deeper stages of sleep were identified by K complexes and sleep spindles. There was no focal slowing. There were no interictal epileptiform discharges. There were no electrographic seizures identified. Photic stimulation and hyperventilation were not performed. Impression: This EEG was obtained while awake and asleep and is normal.   Clinical Correlation: Normal  EEGs, however, do not rule out epilepsy. [name], MD [facility] [phone] If 7pm- 7am, please page neurology on call as listed in [address].   MR ABDOMEN MRCP WO CONTRAST  Result Date: 10/30/2022 CLINICAL DATA:  62 year old male with suspected biliary obstruction. EXAM: MRI ABDOMEN WITHOUT CONTRAST  (INCLUDING MRCP) TECHNIQUE: Multiplanar multisequence MR imaging of the abdomen was performed. Heavily T2-weighted images of the biliary and pancreatic ducts were obtained, and three-dimensional MRCP images were rendered by post processing. COMPARISON:  No prior abdominal MRI. Abdominal ultrasound 10/29/2022. FINDINGS: Comment: Today's study is limited for detection and characterization of visceral and/or vascular lesions by lack of IV gadolinium. Lower chest: Small left pleural effusion lying dependently. Cardiomegaly. Small amount of pericardial fluid and thickening. Hepatobiliary: Diffuse low signal intensity throughout the hepatic parenchyma on in phase dual echo images, and diffuse low signal intensity on T2 weighted  sequences, indicative of a background of severe iron deposition. No discrete cystic or solid hepatic lesions are confidently identified on today's noncontrast examination. No intra or extrahepatic biliary ductal dilatation noted on MRCP images. Common bile duct measures 4 mm in the porta hepatis. No filling defect in the common bile duct to suggest choledocholithiasis. There are filling defects within the gallbladder measuring up to 1.5 cm in diameter, indicative of gallstones. Gallbladder is largely decompressed. Gallbladder wall appears mildly thickened and edematous, but there is no substantial volume of pericholecystic fluid. Pancreas: No definite pancreatic mass or peripancreatic fluid collections or inflammatory changes noted on today's noncontrast examination. No pancreatic ductal dilatation noted on MRCP images. Spleen: Diffuse loss of signal intensity throughout the spleen on in phase dual echo images and T2 weighted images indicative of iron deposition. Adrenals/Urinary Tract: Multiple subcentimeter T1 hypointense, T2 hyperintense lesions scattered throughout both kidneys, incompletely characterized on today's noncontrast examination, but statistically likely to represent small cysts (no imaging follow-up recommended). No hydroureteronephrosis in the visualized portions of the abdomen. Bilateral adrenal glands are normal in appearance. Stomach/Bowel: Visualized portions are unremarkable. Vascular/Lymphatic: No aneurysm identified in the visualized abdominal vasculature. No lymphadenopathy noted in the abdomen. Other:  No significant volume of ascites. Musculoskeletal: No aggressive appearing osseous lesions are noted in the visualized portions of the skeleton. IMPRESSION: 1. Study is positive for cholelithiasis. While there is some mild gallbladder wall thickening and edema, the gallbladder is nearly completely decompressed. These findings in conjunction with the recent ultrasound examination do not  support a diagnosis of acute cholecystitis at this time. 2. No evidence of choledocholithiasis or biliary tract obstruction. 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 Electronically Signed   By: Vinnie Langton M.D.   On: 10/30/2022 05:27   DG Shoulder Left  Result Date: 10/29/2022 CLINICAL DATA:  Left shoulder pain no injury EXAM: LEFT SHOULDER - 2+ VIEW COMPARISON:  Left shoulder 02/06/2008 FINDINGS: There is no evidence of fracture or dislocation. There is no evidence of arthropathy or other focal bone abnormality. Vascular stent overlying the proximal humerus. IMPRESSION: Negative. Electronically Signed   By: Franchot Gallo M.D.   On: 10/29/2022 16:07   US Abdomen Limited RUQ (LIVER/GB)  Result Date: 10/29/2022 CLINICAL DATA:  Right upper quadrant pain for 1 week. EXAM: ULTRASOUND ABDOMEN LIMITED RIGHT UPPER QUADRANT COMPARISON:  None Available. FINDINGS: Gallbladder: Shadowing echogenic stones measure up to 1.5 cm. Associated wall thickening, 8 mm. Negative sonographic Murphy sign. No pericholecystic fluid. Common bile duct: Diameter: Approximately 3 mm, within normal limits. No intrahepatic biliary ductal dilatation. Liver: Parenchymal echogenicity is within normal limits.  No definite focal lesion. Portal vein is patent on color Doppler imaging with normal direction of blood flow towards the liver. Other: None. IMPRESSION: Cholelithiasis. Associated wall thickening without a positive sonographic Percell Miller sign can be seen in the setting of chronic cholecystitis. Electronically Signed   By: Lorin Picket M.D.   On: 10/29/2022 15:26    Physical Exam: BP (!) 140/76   Pulse 82  Constitutional: Pleasant,well-developed, male in no acute distress. Mild icterus Neurological: Alert and oriented to person place and time. Psychiatric: Normal mood and affect. Behavior is normal.   ASSESSMENT: 62 y.o. male here for assessment of the following  1. Abnormal liver enzymes   2. Hyperbilirubinemia   3.  High total IgG   4. Colon cancer screening    As above, extensive workup for cholestasis.  He had no evidence of a biliary tract obstruction.  Serologies as outlined above, eventually had a liver biopsy done with results as outlined above.  This is my first time seeing the liver biopsy, it was ordered under one of the hospitalist at the time.  Changes could for potential drug-induced liver injury although his total IgG and IgG4 levels are elevated, IgG4 related disease could also present like this.  I called Dr. Saralyn Pilar of pathology to see if they can stain his liver biopsy specimen for IgG for and they will do so and let me know what this shows.  This will be very helpful.  If it is negative I am assuming he may have had a drug-induced liver injury (Augmentin?) although there is portal fibrotic changes well.  As such I will refer him to hepatology for their opinion on his case, he is agreeable to this.  Today we will send him to the lab for LFTs, INR, repeat IgG level and also check an SPEP and serum free light chains given the elevated IgG level.  Will otherwise await pathology special stain for IgG4 disease.  Finally we had a discussion about colon cancer screening.  He is never had a prior colonoscopy, currently is asymptomatic and has no bowel symptoms.  If he can tolerate a colonoscopy I think that would be best for him, however we could also consider a Cologuard or other stool test.  We will await his evaluation of liver disease first, he can come back to schedule colon cancer screening at his follow-up with [place].  PLAN: - lab today for LFTs, INR, IgG, SPEP, serum free light chains - review path slide for IgG4 disease - special stain sent after discussion with pathology - refer to Dr. Zollie Scale / [place] [facility] for their opinion - perhaps drug effect from Augmentin? - colonoscopy or cologuard once this acute issue has been addressed for colon cancer screening  Jolly Mango, MD [facility]  Gastroenterology

## 2022-11-20 NOTE — Telephone Encounter (Signed)
Referral faxed to [phone]. [place]: [phone]

## 2022-11-20 NOTE — Progress Notes (Signed)
LFTs 

## 2022-11-24 LAB — PE AND FLC, SERUM
A/G Ratio: 0.7 (ref 0.7–1.7)
Albumin ELP: 3.2 g/dL (ref 2.9–4.4)
Alpha 1: 0.4 g/dL (ref 0.0–0.4)
Alpha 2: 0.6 g/dL (ref 0.4–1.0)
Beta: 0.9 g/dL (ref 0.7–1.3)
Gamma Globulin: 3.1 g/dL — ABNORMAL HIGH (ref 0.4–1.8)
Globulin, Total: 4.9 g/dL — ABNORMAL HIGH (ref 2.2–3.9)
Ig Kappa Free Light Chain: 494.6 mg/L — ABNORMAL HIGH (ref 3.3–19.4)
Ig Lambda Free Light Chain: 279.1 mg/L — ABNORMAL HIGH (ref 5.7–26.3)
KAPPA/LAMBDA RATIO: 1.77 — ABNORMAL HIGH (ref 0.26–1.65)
Total Protein: 8.1 g/dL (ref 6.0–8.5)

## 2022-11-24 NOTE — Telephone Encounter (Signed)
Rec'd notification from [place] that patient has a New Patient appointment on 01-23-23 at 11am  (ph: [phone])

## 2022-11-25 LAB — PROTEIN ELECTROPHORESIS, SERUM
Albumin ELP: 3.1 g/dL — ABNORMAL LOW (ref 3.8–4.8)
Alpha 1: 0.4 g/dL — ABNORMAL HIGH (ref 0.2–0.3)
Alpha 2: 0.7 g/dL (ref 0.5–0.9)
Beta 2: 0.7 g/dL — ABNORMAL HIGH (ref 0.2–0.5)
Beta Globulin: 0.4 g/dL (ref 0.4–0.6)
Gamma Globulin: 2.8 g/dL — ABNORMAL HIGH (ref 0.8–1.7)
Total Protein: 8.2 g/dL — ABNORMAL HIGH (ref 6.1–8.1)

## 2022-11-25 LAB — IGG: IgG (Immunoglobin G), Serum: 3429 mg/dL — ABNORMAL HIGH (ref 600–1640)

## 2022-11-26 ENCOUNTER — Telehealth: Payer: Self-pay

## 2022-11-26 DIAGNOSIS — R748 Abnormal levels of other serum enzymes: Secondary | ICD-10-CM

## 2022-11-26 DIAGNOSIS — R778 Other specified abnormalities of plasma proteins: Secondary | ICD-10-CM

## 2022-11-26 DIAGNOSIS — R768 Other specified abnormal immunological findings in serum: Secondary | ICD-10-CM

## 2022-11-26 NOTE — Telephone Encounter (Signed)
Referral refaxed to [phone]

## 2022-11-26 NOTE — Telephone Encounter (Signed)
Ambulatory referral to Hematology in epic.

## 2022-11-26 NOTE — Telephone Encounter (Signed)
Vicky from [facility] called and said the paperwork faxed is not eligible please refax.

## 2022-12-17 ENCOUNTER — Other Ambulatory Visit: Payer: Self-pay

## 2022-12-17 ENCOUNTER — Inpatient Hospital Stay: Payer: Medicare Other | Attending: Hematology | Admitting: Hematology

## 2022-12-17 ENCOUNTER — Inpatient Hospital Stay: Payer: Medicare Other

## 2022-12-17 VITALS — BP 109/60 | HR 83 | Temp 97.7°F | Resp 20 | Wt 220.0 lb

## 2022-12-17 DIAGNOSIS — N186 End stage renal disease: Secondary | ICD-10-CM | POA: Diagnosis not present

## 2022-12-17 DIAGNOSIS — I132 Hypertensive heart and chronic kidney disease with heart failure and with stage 5 chronic kidney disease, or end stage renal disease: Secondary | ICD-10-CM | POA: Insufficient documentation

## 2022-12-17 DIAGNOSIS — R7989 Other specified abnormal findings of blood chemistry: Secondary | ICD-10-CM

## 2022-12-17 DIAGNOSIS — D892 Hypergammaglobulinemia, unspecified: Secondary | ICD-10-CM

## 2022-12-17 DIAGNOSIS — R748 Abnormal levels of other serum enzymes: Secondary | ICD-10-CM | POA: Diagnosis not present

## 2022-12-17 DIAGNOSIS — D890 Polyclonal hypergammaglobulinemia: Secondary | ICD-10-CM | POA: Diagnosis present

## 2022-12-17 DIAGNOSIS — R768 Other specified abnormal immunological findings in serum: Secondary | ICD-10-CM | POA: Diagnosis not present

## 2022-12-17 DIAGNOSIS — I509 Heart failure, unspecified: Secondary | ICD-10-CM | POA: Diagnosis not present

## 2022-12-17 DIAGNOSIS — Z992 Dependence on renal dialysis: Secondary | ICD-10-CM | POA: Diagnosis not present

## 2022-12-17 DIAGNOSIS — Z89511 Acquired absence of right leg below knee: Secondary | ICD-10-CM | POA: Insufficient documentation

## 2022-12-17 DIAGNOSIS — E1122 Type 2 diabetes mellitus with diabetic chronic kidney disease: Secondary | ICD-10-CM | POA: Insufficient documentation

## 2022-12-17 LAB — CBC WITH DIFFERENTIAL (CANCER CENTER ONLY)
Abs Immature Granulocytes: 0.02 10*3/uL (ref 0.00–0.07)
Basophils Absolute: 0.1 10*3/uL (ref 0.0–0.1)
Basophils Relative: 2 %
Eosinophils Absolute: 0.8 10*3/uL — ABNORMAL HIGH (ref 0.0–0.5)
Eosinophils Relative: 11 %
HCT: 39.1 % (ref 39.0–52.0)
Hemoglobin: 13 g/dL (ref 13.0–17.0)
Immature Granulocytes: 0 %
Lymphocytes Relative: 25 %
Lymphs Abs: 1.8 10*3/uL (ref 0.7–4.0)
MCH: 30.2 pg (ref 26.0–34.0)
MCHC: 33.2 g/dL (ref 30.0–36.0)
MCV: 90.7 fL (ref 80.0–100.0)
Monocytes Absolute: 1.7 10*3/uL — ABNORMAL HIGH (ref 0.1–1.0)
Monocytes Relative: 23 %
Neutro Abs: 2.9 10*3/uL (ref 1.7–7.7)
Neutrophils Relative %: 39 %
Platelet Count: 272 10*3/uL (ref 150–400)
RBC: 4.31 MIL/uL (ref 4.22–5.81)
RDW: 17.9 % — ABNORMAL HIGH (ref 11.5–15.5)
WBC Count: 7.4 10*3/uL (ref 4.0–10.5)
nRBC: 0 % (ref 0.0–0.2)

## 2022-12-17 LAB — IRON AND IRON BINDING CAPACITY (CC-WL,HP ONLY)
Iron: 72 ug/dL (ref 45–182)
Saturation Ratios: 38 % (ref 17.9–39.5)
TIBC: 190 ug/dL — ABNORMAL LOW (ref 250–450)
UIBC: 118 ug/dL (ref 117–376)

## 2022-12-17 LAB — C-REACTIVE PROTEIN: CRP: 1.6 mg/dL — ABNORMAL HIGH (ref <1.0)

## 2022-12-17 LAB — FERRITIN: Ferritin: 1481 ng/mL — ABNORMAL HIGH (ref 24–336)

## 2022-12-17 LAB — SEDIMENTATION RATE: Sed Rate: 84 mm/hr — ABNORMAL HIGH (ref 0–16)

## 2022-12-17 NOTE — Progress Notes (Signed)
HEMATOLOGY/ONCOLOGY CONSULTATION NOTE  Date of Service: 12/17/2022  Patient Care Team: [place], Betty G, MD as PCP - General (Family Medicine) Eloise Harman, DO as Consulting Physician (Gastroenterology)  CHIEF COMPLAINTS/PURPOSE OF CONSULTATION:  Elevated ferritin and elevated IgG level  HISTORY OF PRESENTING ILLNESS:   [name] [name] Sr. is a wonderful 62 y.o. male who has been referred to [place] by Dr. [place]-W[place]ree Cellar for evaluation and management of Elevated ferritin and elevated IgG level. Ferritin levels were 1410, iron saturation at 27%, IgG levels were 2871, and IgG4 were 109 on 11/20/2022. Patient has abnormal liver enzymes which are being evaluated.   Patient notes he was hospitalized due to upper abdominal pain, which is where he was diagnosed with abnormal liver enzyme and had liver biopsy.   He notes that he has been on dialysis for around 12 years. Patient denies any medications changes.   Patient denies any recent infection issues, abdominal pain, fever, chills, night sweats, abnormal bowel movement, bone pain, swallowing problem, back pain, chest pain, or leg swelling. He does complain of losing around 3-40 lbs in the last few months because of not eating. Patient notes that he only eats breakfast.   Patient denies any taking any supplements.   He is currently living in long-term care facility, [facility], and they provide transportation assistance. He has been in this facility for around a year now and plans on leaving the facility.   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   Patient notes he has congestive heart failure, but is unsure of how long he has had it.   Patient denies family history of liver disease or other cancers.   MEDICAL HISTORY:  Past Medical History:  Diagnosis Date   CHF (congestive heart failure) ([place])    ECHO 02/05/22  EF 45-50%, DD indeterminent   Diabetes mellitus without complication ([place])    type 2   ESRD  on hemodialysis ([place])    mon wed fri   Hypertension    Kidney failure    Myocardial infarction ([place])    Nonischemic cardiomyopathy ([place])     SURGICAL HISTORY: Past Surgical History:  Procedure Laterality Date   A/V FISTULAGRAM N/A 08/15/2019   Procedure: A/V FISTULAGRAM;  Surgeon: [name], MD;  Location: [place] [facility];  Service: Cardiovascular;  Laterality: N/A;   AMPUTATION Right 02/02/2022   Procedure: AMPUTATION 5th RAY;  Surgeon: [name], MD;  Location: [place];  Service: Podiatry;  Laterality: Right;   AMPUTATION Right 05/23/2022   Procedure: RIGHT BELOW KNEE AMPUTATION;  Surgeon: [name], MD;  Location: [place];  Service: Orthopedics;  Laterality: Right;   AMPUTATION Right 07/04/2022   Procedure: REVISION AMPUTATION BELOW KNEE;  Surgeon: [name], MD;  Location: [place];  Service: Orthopedics;  Laterality: Right;   APPLICATION OF WOUND VAC  05/23/2022   Procedure: APPLICATION OF WOUND VAC;  Surgeon: [name], MD;  Location: [place];  Service: Orthopedics;;   AV FISTULA PLACEMENT     CARDIAC CATHETERIZATION  02/17/2018   LHC 02/17/18 ([name]): 30% pLAD, 30% dRCA, LVEDP 25-30, LVEF 25%.     CATARACT EXTRACTION W/PHACO Right 03/29/2020   Procedure: CATARACT EXTRACTION PHACO AND INTRAOCULAR LENS PLACEMENT ([place])  RIGHT DIABETIC;  Surgeon: [name], MD;  Location: [facility];  Service: Ophthalmology;  Laterality: Right;  Lot # [id number] H [place]: 02:30.9 AP% 9.5% CDE: 14.38   EYE SURGERY Right    cataract  removed   I & D EXTREMITY Right 02/02/2022   Procedure: IRRIGATION AND DEBRIDEMENT RIGHT FOOT;  Surgeon: [name], MD;  Location: [place];  Service: Podiatry;  Laterality: Right;   INCISION AND DRAINAGE OF WOUND Right 02/08/2022   Procedure: IRRIGATION AND DEBRIDEMENT WOUND;  Surgeon: [name], DPM;  Location: [place];  Service: Podiatry;  Laterality: Right;  with removal of infected bone    INSERTION OF DIALYSIS CATHETER   08/13/2019   Procedure: Insertion Of Dialysis Catheter;  Surgeon: [name], MD;  Location: [place];  Service: Vascular;;   INSERTION OF DIALYSIS CATHETER Left 07/19/2020   Procedure: INSERTION OF DIALYSIS CATHETER;  Surgeon: [name], MD;  Location: [place];  Service: Vascular;  Laterality: Left;   IR US GUIDE BX ASP/DRAIN  11/07/2022   IRRIGATION AND DEBRIDEMENT ABSCESS     PERIPHERAL VASCULAR BALLOON ANGIOPLASTY  08/15/2019   Procedure: PERIPHERAL VASCULAR BALLOON ANGIOPLASTY;  Surgeon: [name], MD;  Location: [place] [facility];  Service: Cardiovascular;;   PERIPHERAL VASCULAR BALLOON ANGIOPLASTY Left 02/06/2022   Procedure: PERIPHERAL VASCULAR BALLOON ANGIOPLASTY;  Surgeon: [name], MD;  Location: [place] [facility];  Service: Cardiovascular;  Laterality: Left;   REVISON OF ARTERIOVENOUS FISTULA Left 07/19/2020   Procedure: REVISON/PLICATION OF ARTERIOVENOUS FISTULA LEFT;  Surgeon: [name], MD;  Location: [facility];  Service: Vascular;  Laterality: Left;   THROMBECTOMY AND REVISION OF ARTERIOVENTOUS (AV) GORETEX  GRAFT Left 08/13/2019   Procedure: THROMBECTOMY OF LEFT ARM ARTERIOVENTOUS (AV) FISTULA;  Surgeon: [name], MD;  Location: [facility];  Service: Vascular;  Laterality: Left;   TOE AMPUTATION      SOCIAL HISTORY: Social History   Socioeconomic History   Marital status: Divorced    Spouse name: Not on file   Number of children: Not on file   Years of education: Not on file   Highest education level: Not on file  Occupational History   Not on file  Tobacco Use   Smoking status: Never   Smokeless tobacco: Never  Vaping Use   Vaping Use: Never used  Substance and Sexual Activity   Alcohol use: Not Currently   Drug use: Never   Sexual activity: Not on file  Other Topics Concern   Not on file  Social History Narrative   Not on file   Social Determinants of Health   Financial Resource Strain: Not on file  Food  Insecurity: No Food Insecurity (10/29/2022)   Hunger Vital Sign    Worried About Running Out of Food in the Last Year: Never true    Ran Out of Food in the Last Year: Never true  Transportation Needs: No Transportation Needs (10/29/2022)   PRAPARE - Hydrologist (Medical): No    Lack of Transportation (Non-Medical): No  Physical Activity: Not on file  Stress: Not on file  Social Connections: Not on file  Intimate Partner Violence: Not At Risk (10/29/2022)   Humiliation, Afraid, Rape, and Kick questionnaire    Fear of Current or Ex-Partner: No    Emotionally Abused: No    Physically Abused: No    Sexually Abused: No    FAMILY HISTORY: Family History  Problem Relation Age of Onset   Hypertension Mother     ALLERGIES:  is allergic to sulfa antibiotics.  MEDICATIONS:  Current Outpatient Medications  Medication Sig Dispense Refill   amLODipine (NORVASC) 5 MG tablet Take 5 mg by mouth  daily.     aspirin EC 81 MG EC tablet Take 1 tablet (81 mg total) by mouth daily. Swallow whole. 30 tablet 11   benzonatate (TESSALON) 200 MG capsule Take 200 mg by mouth See admin instructions. Take one capsule by mouth two times a day every Tue, Thu, Sat per [facility].     ethyl chloride spray Apply 1 Application topically Every Tuesday,Thursday,and Saturday with dialysis.     famotidine (PEPCID) 20 MG tablet Take 20 mg by mouth at bedtime.     gabapentin (NEURONTIN) 100 MG capsule Take 100 mg by mouth at bedtime.     guaifenesin (HUMIBID E) 400 MG TABS tablet Take 400 mg by mouth in the morning, at noon, and at bedtime.     guaiFENesin (ROBITUSSIN) 100 MG/5ML liquid Take 200 mg by mouth every 4 (four) hours as needed for cough.     insulin glargine (LANTUS) 100 UNIT/ML injection Inject 0.25 mLs (25 Units total) into the skin at bedtime. 10 mL 0   ipratropium-albuterol (DUONEB) 0.5-2.5 (3) MG/3ML SOLN Take 3 mLs by nebulization every 4 (four) hours as needed (for wheezing or  shortness of breath).     loperamide (IMODIUM) 2 MG capsule Take 1 capsule (2 mg total) by mouth as needed for diarrhea or loose stools. (Patient taking differently: Take 2 mg by mouth daily as needed for diarrhea or loose stools.) 30 capsule 0   Menthol, Topical Analgesic, (BIOFREEZE) 4 % GEL Apply 1 Application topically every 4 (four) hours as needed (for pain).     Multiple Vitamin (MULTIVITAMIN WITH MINERALS) TABS tablet Take 1 tablet by mouth daily.     ondansetron (ZOFRAN) 4 MG tablet Take 4 mg by mouth every 6 (six) hours as needed for nausea or vomiting.     pantoprazole (PROTONIX) 20 MG tablet Take 20 mg by mouth See admin instructions. Take 20mg  by mouth on mon, wed, fri and sun.     senna-docusate (SENOKOT-S) 8.6-50 MG tablet Take 1 tablet by mouth at bedtime as needed for mild constipation.     sevelamer (RENAGEL) 800 MG tablet Take 2,400 mg by mouth See admin instructions. Take 2400mg  by mouth three times daily on mon, wed, fri and sun.     sevelamer carbonate (RENVELA) 800 MG tablet Take 2,400 mg by mouth See admin instructions. Take 2400mg  by mouth twice daily on Tues, Thurs, and Saturdays.     Simethicone 80 MG TABS Take 160 mg by mouth every 8 (eight) hours as needed (for gas).     No current facility-administered medications for this visit.    REVIEW OF SYSTEMS:    10 Point review of Systems was done is negative except as noted above.  PHYSICAL EXAMINATION: ECOG PERFORMANCE STATUS: 2 - Symptomatic, <50% confined to bed  . Vitals:   12/17/22 1125  BP: 109/60  Pulse: 83  Resp: 20  Temp: 97.7 F (36.5 C)  SpO2: 100%   Filed Weights   12/17/22 1125  Weight: 220 lb (99.8 kg)   .Body mass index is 29.03 kg/m.  GENERAL:alert, in no acute distress and comfortable SKIN: no acute rashes, no significant lesions EYES: conjunctiva are pink and non-injected, sclera anicteric OROPHARYNX: MMM, no exudates, no oropharyngeal erythema or ulceration NECK: supple, no  JVD LYMPH:  no palpable lymphadenopathy in the cervical, axillary or inguinal regions LUNGS: clear to auscultation b/l with normal respiratory effort HEART: regular rate & rhythm ABDOMEN:  normoactive bowel sounds , non tender, not distended. Extremity: no  pedal edema PSYCH: alert & oriented x 3 with fluent speech NEURO: no focal motor/sensory deficits  LABORATORY DATA:  I have reviewed the data as listed .    Latest Ref Rng & Units 12/17/2022   12:42 PM 11/05/2022    2:43 AM 11/04/2022    9:13 AM  CBC  WBC 4.0 - 10.5 K/uL 7.4  8.2  8.1   Hemoglobin 13.0 - 17.0 g/dL 13.0  10.5  10.3   Hematocrit 39.0 - 52.0 % 39.1  31.6  29.8   Platelets 150 - 400 K/uL 272  439  436    .    Latest Ref Rng & Units 11/20/2022   12:44 PM 11/20/2022   11:44 AM 11/08/2022    8:26 AM  CMP  Glucose 70 - 99 mg/dL   128   BUN 6 - 20 mg/dL   39   Creatinine 0.61 - 1.24 mg/dL   9.74   Sodium 135 - 145 mmol/L   134   Potassium 3.5 - 5.1 mmol/L   4.2   Chloride 98 - 111 mmol/L   94   CO2 22 - 32 mmol/L   28   Calcium 8.9 - 10.3 mg/dL   9.1   Total Protein 6.0 - 8.3 g/dL 8.2  8.1    8.2  7.9   Total Bilirubin 0.2 - 1.2 mg/dL 3.3   5.2   Alkaline Phos 39 - 117 U/L 268   230   AST 0 - 37 U/L 25   42   ALT 0 - 53 U/L 19   28     RADIOGRAPHIC STUDIES: I have personally reviewed the radiological images as listed and agreed with the findings in the report. No results found.  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 Hemochromatosis testing neg Likely ferritin leak related to liver inflammation and as an acute pahse reactant. Cannot r/o 2 nd hemosiderosis due to chronic inflammatory liver disease.  Marland KitchenHemochromatosis DNA-PCR(c282y,h63d) Order: [id number] Status: Final result     Visible to  patient: Yes (not seen)     Next appt: 12/29/2022 at 08:40 AM in Oncology Sullivan Lone, MD)   0 Result Notes    Component 1 mo ago  DNA Mutation Analysis Comment  Comment: (NOTE) Result: c.845G>A (p.Cys282Tyr) - Not Detected c.187C>G (p.His63Asp) - Not Detected c.193A>T (p.Ser65Cys) - Not Detected     PLAN: -Discussed the reason for the referral.  -educated the patient on meaning and etiologies of polyclonal hypergammaglobulinemia. -Discussed with the patient that his elevated IgG levels is most likely due to inflammation probably from his chronic lnflammatory liver disease -will send out labs for further evaluation of hypergammaglobulinemia. -discussed etiologies for elevated ferritin levels. HFE gene mutations neg for hereditary hemochromatosis. -Recommended blood work during this visit and patient agrees.  . Orders Placed This Encounter  Procedures   CBC with Differential ([place] Only)    Standing Status:   Future    Number of Occurrences:   1    Standing Expiration Date:   12/18/2023   Multiple Myeloma Panel (SPEP&IFE w/QIG)    Standing Status:   Future    Number of Occurrences:   1    Standing Expiration Date:   12/18/2023   Kappa/lambda light chains    Standing Status:   Future  Number of Occurrences:   1    Standing Expiration Date:   12/18/2023   Sedimentation rate    Standing Status:   Future    Number of Occurrences:   1    Standing Expiration Date:   12/18/2023   C-reactive protein    Standing Status:   Future    Number of Occurrences:   1    Standing Expiration Date:   12/17/2023   Ferritin    Standing Status:   Future    Number of Occurrences:   1    Standing Expiration Date:   12/18/2023     FOLLOW-UP: Labs today Phone visit with dr Irene Limbo in 2 weeks  .The total time spent in the appointment was 60 minutes* .  All of the patient's questions were answered with apparent satisfaction. The patient knows to call the clinic with any problems,  questions or concerns.   Sullivan Lone MD MS AAHIVMS [facility] Hematology/Oncology Physician [facility]  .*Total Encounter Time as defined by the Centers for Medicare and Medicaid Services includes, in addition to the face-to-face time of a patient visit (documented in the note above) non-face-to-face time: obtaining and reviewing outside history, ordering and reviewing medications, tests or procedures, care coordination (communications with other health care professionals or caregivers) and documentation in the medical record.   I, Cleda Mccreedy, am acting as a Education administrator for Sullivan Lone, MD.  .I have reviewed the above documentation for accuracy and completeness, and I agree with the above. Brunetta Genera MD

## 2022-12-18 LAB — KAPPA/LAMBDA LIGHT CHAINS
Kappa free light chain: 492.9 mg/L — ABNORMAL HIGH (ref 3.3–19.4)
Kappa, lambda light chain ratio: 1.68 — ABNORMAL HIGH (ref 0.26–1.65)
Lambda free light chains: 293.5 mg/L — ABNORMAL HIGH (ref 5.7–26.3)

## 2022-12-22 LAB — MULTIPLE MYELOMA PANEL, SERUM
Albumin SerPl Elph-Mcnc: 3.4 g/dL (ref 2.9–4.4)
Albumin/Glob SerPl: 0.7 (ref 0.7–1.7)
Alpha 1: 0.3 g/dL (ref 0.0–0.4)
Alpha2 Glob SerPl Elph-Mcnc: 0.7 g/dL (ref 0.4–1.0)
B-Globulin SerPl Elph-Mcnc: 1 g/dL (ref 0.7–1.3)
Gamma Glob SerPl Elph-Mcnc: 3.2 g/dL — ABNORMAL HIGH (ref 0.4–1.8)
Globulin, Total: 5.1 g/dL — ABNORMAL HIGH (ref 2.2–3.9)
IgA: 571 mg/dL — ABNORMAL HIGH (ref 90–386)
IgG (Immunoglobin G), Serum: 3632 mg/dL — ABNORMAL HIGH (ref 603–1613)
IgM (Immunoglobulin M), Srm: 85 mg/dL (ref 20–172)
Total Protein ELP: 8.5 g/dL (ref 6.0–8.5)

## 2022-12-22 LAB — SURGICAL PATHOLOGY

## 2022-12-24 IMAGING — CR DG ABDOMEN 1V
2 series · 2 of 2 positions shown · non-contrast
Comparison: None.

CLINICAL DATA: Sudden onset of nausea and vomiting.

EXAM:
ABDOMEN - 1 VIEW

[abdomen kub (1 of 2)]
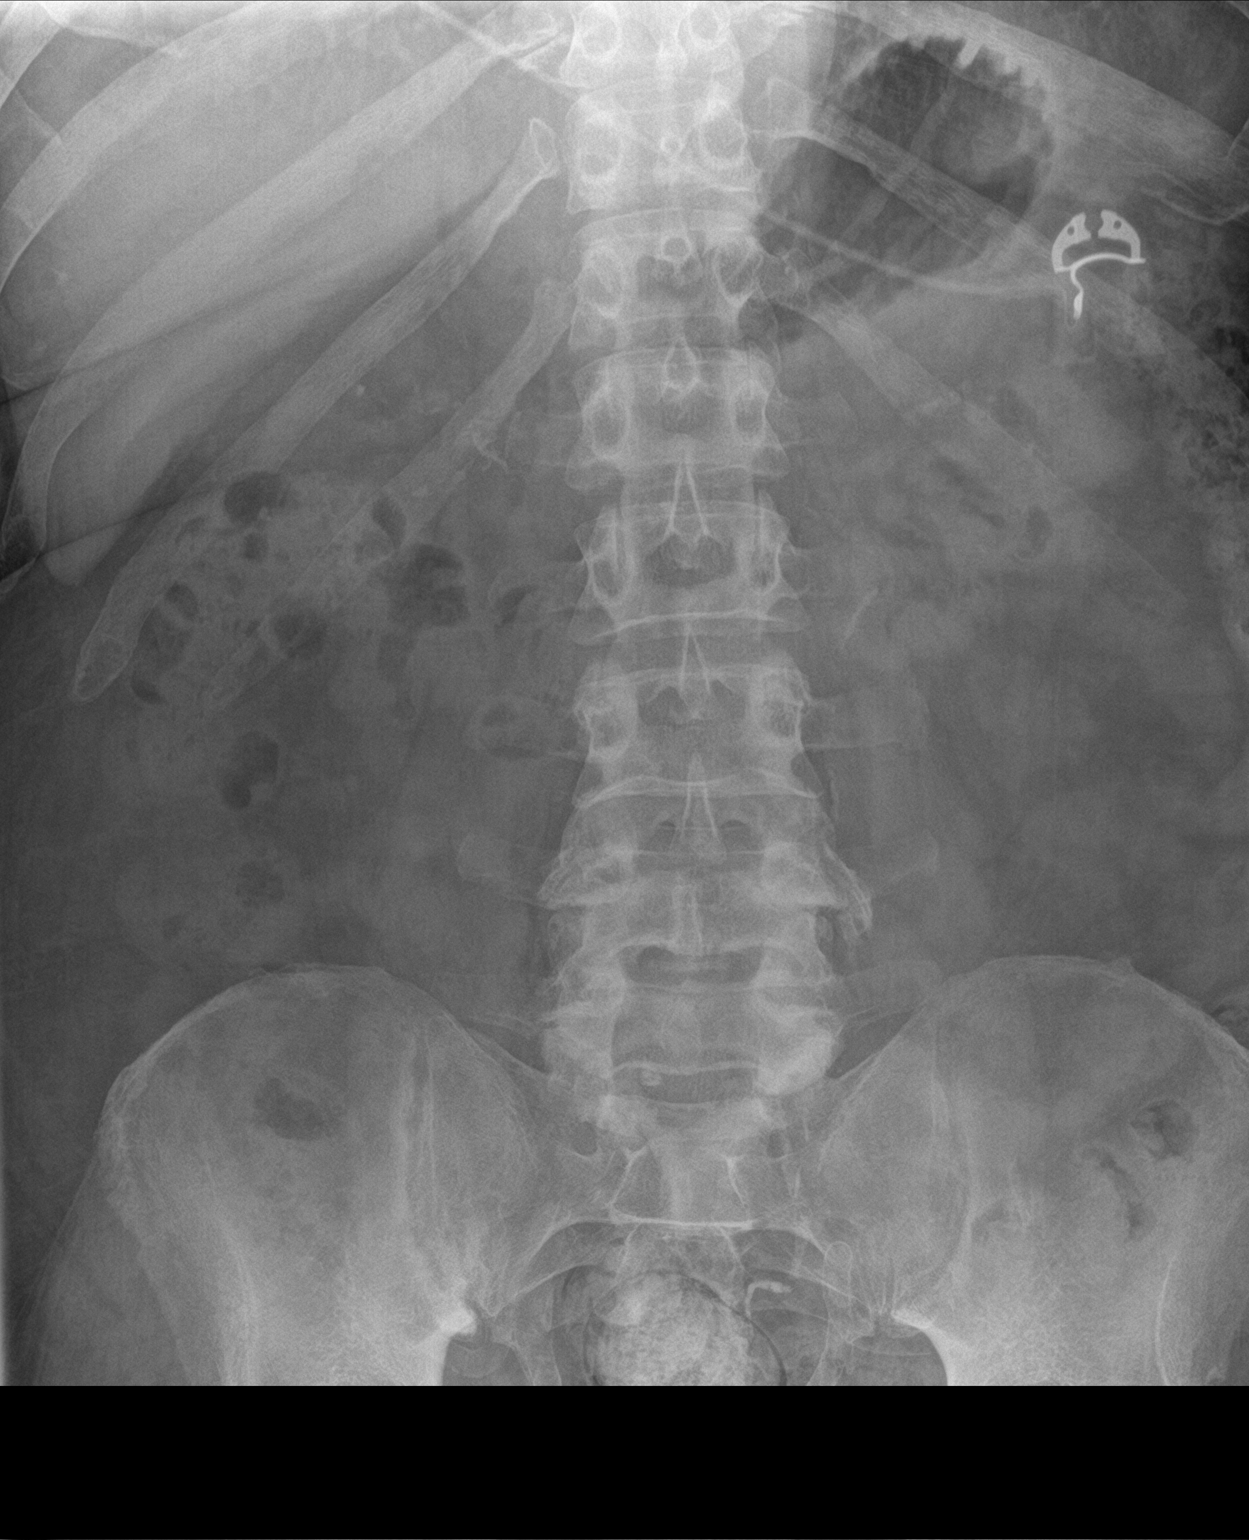

[abdomen kub (2 of 2)]
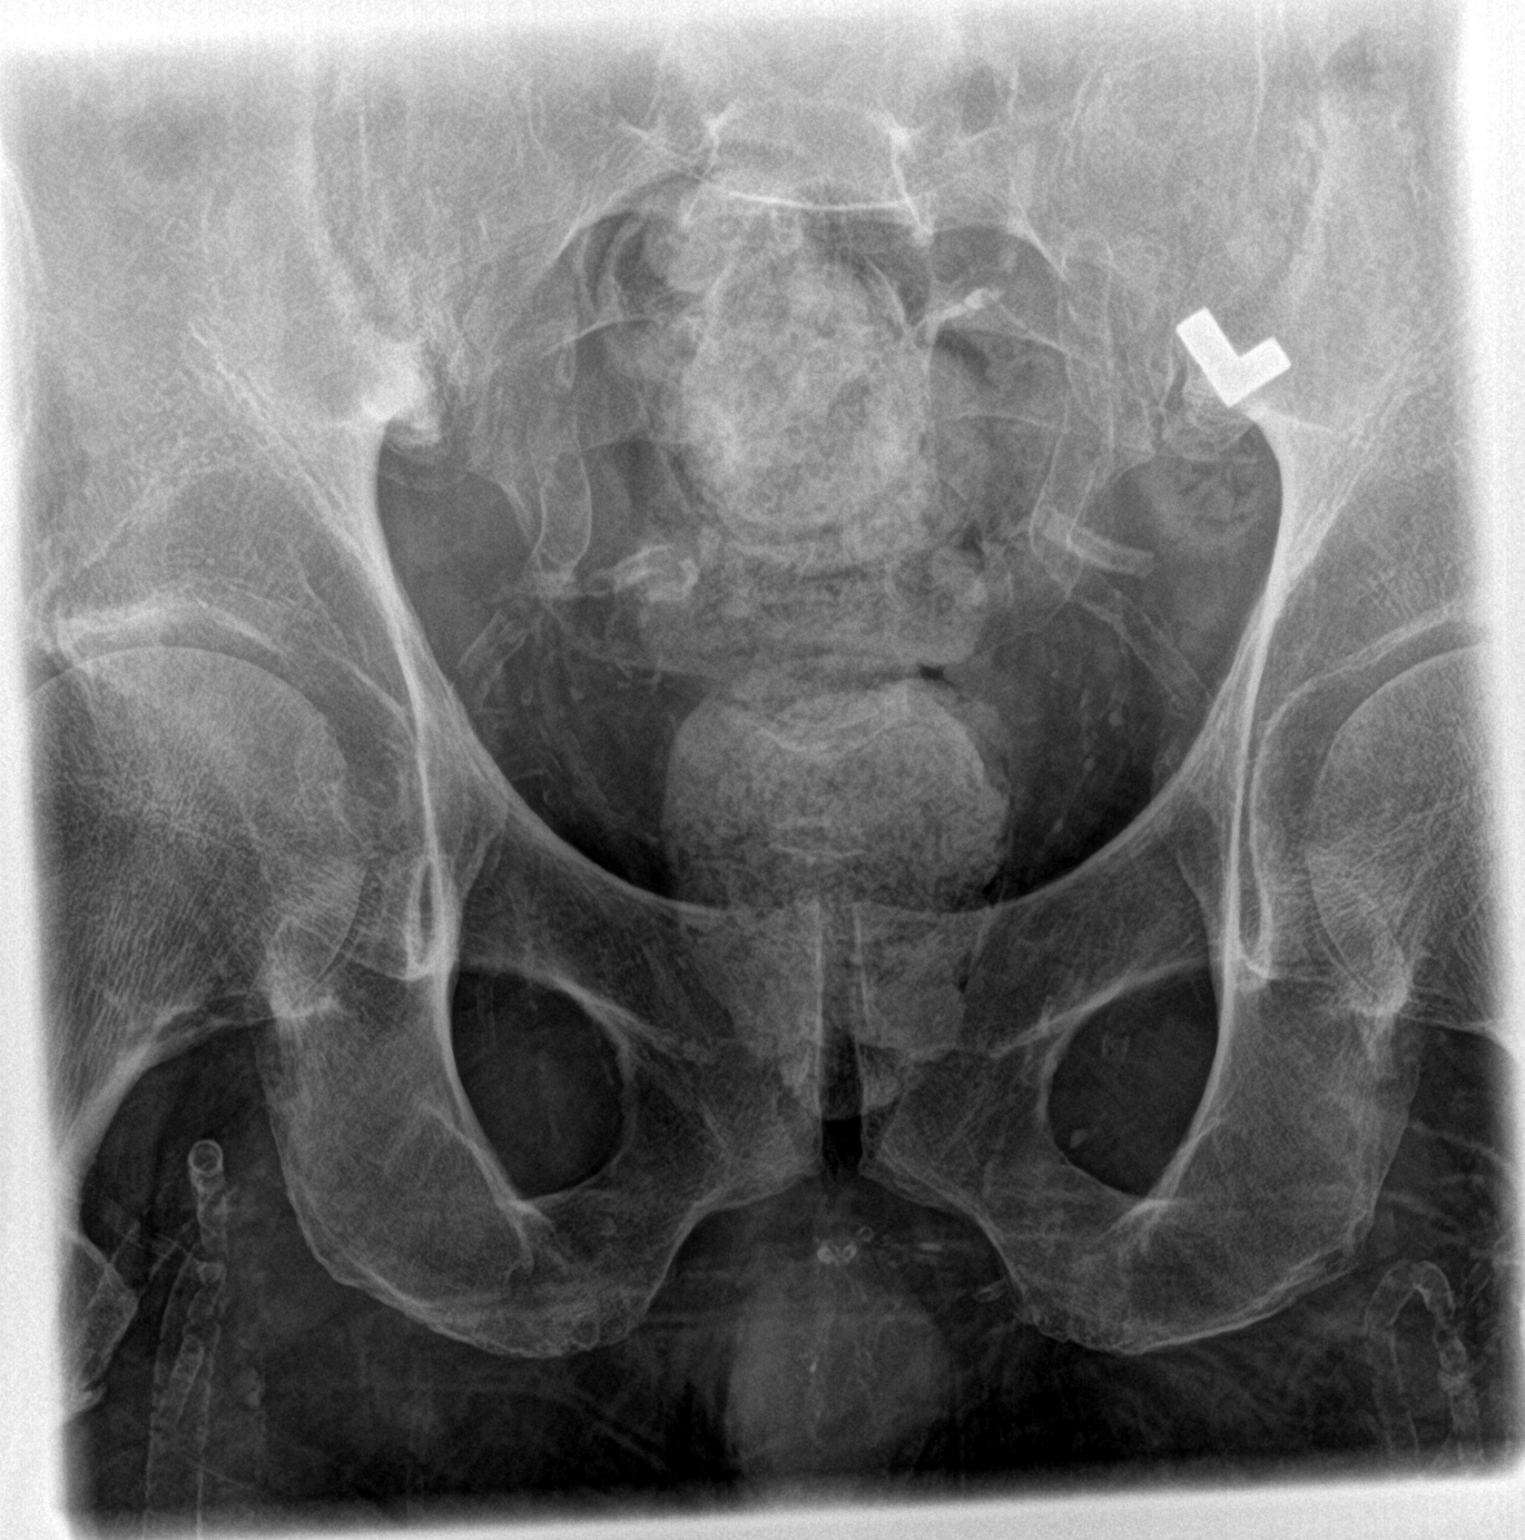

[2 of 2 positions shown; findings below may reference images not displayed]

FINDINGS: The bowel gas pattern is normal. Large amount of retained stool in
the colorectal region. No radio-opaque calculi or other significant
radiographic abnormality are seen.
IMPRESSION: 1. Nonobstructive bowel gas pattern.
2. Large amount of stool in the rectosigmoid region concerning for
constipation/fecal impaction.

## 2022-12-29 ENCOUNTER — Inpatient Hospital Stay: Payer: Medicare Other | Attending: Hematology | Admitting: Hematology

## 2022-12-29 DIAGNOSIS — R7989 Other specified abnormal findings of blood chemistry: Secondary | ICD-10-CM

## 2022-12-29 DIAGNOSIS — I132 Hypertensive heart and chronic kidney disease with heart failure and with stage 5 chronic kidney disease, or end stage renal disease: Secondary | ICD-10-CM | POA: Insufficient documentation

## 2022-12-29 DIAGNOSIS — I509 Heart failure, unspecified: Secondary | ICD-10-CM | POA: Diagnosis not present

## 2022-12-29 DIAGNOSIS — R768 Other specified abnormal immunological findings in serum: Secondary | ICD-10-CM | POA: Diagnosis not present

## 2022-12-29 DIAGNOSIS — D892 Hypergammaglobulinemia, unspecified: Secondary | ICD-10-CM | POA: Diagnosis not present

## 2022-12-29 DIAGNOSIS — D890 Polyclonal hypergammaglobulinemia: Secondary | ICD-10-CM | POA: Diagnosis present

## 2022-12-29 NOTE — Progress Notes (Signed)
HEMATOLOGY/ONCOLOGY PHONE VISIT NOTE  Date of Service: 12/29/2022  Patient Care Team: Martinique, Betty G, MD as PCP - General (Family Medicine) Eloise Harman, DO as Consulting Physician (Gastroenterology)  CHIEF COMPLAINTS/PURPOSE OF CONSULTATION:  Elevated ferritin and elevated IgG level  HISTORY OF PRESENTING ILLNESS:   [name] POOLER Sr. is a wonderful 62 y.o. male who has been referred to [place] by Dr. Westley Cellar for evaluation and management of Elevated ferritin and elevated IgG level. Ferritin levels were 1410, iron saturation at 27%, IgG levels were 2871, and IgG4 were 109 on 11/20/2022. Patient has abnormal liver enzymes which are being evaluated.   Patient notes he was hospitalized due to upper abdominal pain, which is where he was diagnosed with abnormal liver enzyme and had liver biopsy.   He notes that he has been on dialysis for around 12 years. Patient denies any medications changes.   Patient denies any recent infection issues, abdominal pain, fever, chills, night sweats, abnormal bowel movement, bone pain, swallowing problem, back pain, chest pain, or leg swelling. He does complain of losing around 3-40 lbs in the last few months because of not eating. Patient notes that he only eats breakfast.   Patient denies any taking any supplements.   He is currently living in long-term care facility, [facility], and they provide transportation assistance. He has been in this facility for around a year now and plans on leaving the facility.   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   Patient notes he has congestive heart failure, but is unsure of how long he has had it.   Patient denies family history of liver disease or other cancers.   INTERVAL HISTORY:  [name] CHHABRA Sr. is a wonderful 62 y.o. male who is contacted via phone for continued evaluation and management of Elevated ferritin and elevated IgG level. .I connected with [name] MELILLO  Sr. on 12/29/2022 at  8:40 AM EST by telephone visit and verified that I am speaking with the correct person using two identifiers. I spoke to the patient's daughter.  Patient's daughter notes he has been doing well overall without any new medical concerns since our last visit.  I discussed the limitations, risks, security and privacy concerns of performing an evaluation and management service by telemedicine and the availability of in-person appointments. I also discussed with the patient that there may be a patient responsible charge related to this service. The patient expressed understanding and agreed to proceed.   Other persons participating in the visit and their role in the encounter: Patient's daughter   Patient's location: Home  Provider's location: [facility]   Chief Complaint: Elevated ferritin and elevated IgG level    MEDICAL HISTORY:  Past Medical History:  Diagnosis Date   CHF (congestive heart failure) ([place])    ECHO 02/05/22  EF 45-50%, DD indeterminent   Diabetes mellitus without complication ([place])    type 2   ESRD on hemodialysis ([place])    mon wed fri   Hypertension    Kidney failure    Myocardial infarction ([place])    Nonischemic cardiomyopathy ([place])     SURGICAL HISTORY: Past Surgical History:  Procedure Laterality Date   A/V FISTULAGRAM N/A 08/15/2019   Procedure: A/V FISTULAGRAM;  Surgeon: [name], MD;  Location: [place] [facility];  Service: Cardiovascular;  Laterality: N/A;   AMPUTATION Right 02/02/2022   Procedure: AMPUTATION 5th RAY;  Surgeon: [name], MD;  Location:  [place] [facility];  Service: Podiatry;  Laterality: Right;   AMPUTATION Right 05/23/2022   Procedure: RIGHT BELOW KNEE AMPUTATION;  Surgeon: [name], MD;  Location: [place];  Service: Orthopedics;  Laterality: Right;   AMPUTATION Right 07/04/2022   Procedure: REVISION AMPUTATION BELOW KNEE;  Surgeon: [name], MD;  Location: [place];  Service: Orthopedics;  Laterality: Right;    APPLICATION OF WOUND VAC  05/23/2022   Procedure: APPLICATION OF WOUND VAC;  Surgeon: [name], MD;  Location: [place];  Service: Orthopedics;;   AV FISTULA PLACEMENT     CARDIAC CATHETERIZATION  02/17/2018   LHC 02/17/18 ([name]): 30% pLAD, 30% dRCA, LVEDP 25-30, LVEF 25%.     CATARACT EXTRACTION W/PHACO Right 03/29/2020   Procedure: CATARACT EXTRACTION PHACO AND INTRAOCULAR LENS PLACEMENT ([place])  RIGHT DIABETIC;  Surgeon: [name], MD;  Location: [facility];  Service: Ophthalmology;  Laterality: Right;  Lot # [id number] H [place]: 02:30.9 AP% 9.5% CDE: 14.38   EYE SURGERY Right    cataract removed   I & D EXTREMITY Right 02/02/2022   Procedure: IRRIGATION AND DEBRIDEMENT RIGHT FOOT;  Surgeon: [name], MD;  Location: [place];  Service: Podiatry;  Laterality: Right;   INCISION AND DRAINAGE OF WOUND Right 02/08/2022   Procedure: IRRIGATION AND DEBRIDEMENT WOUND;  Surgeon: [name], DPM;  Location: [place];  Service: Podiatry;  Laterality: Right;  with removal of infected bone    INSERTION OF DIALYSIS CATHETER  08/13/2019   Procedure: Insertion Of Dialysis Catheter;  Surgeon: [name], MD;  Location: [place];  Service: Vascular;;   INSERTION OF DIALYSIS CATHETER Left 07/19/2020   Procedure: INSERTION OF DIALYSIS CATHETER;  Surgeon: [name], MD;  Location: [place];  Service: Vascular;  Laterality: Left;   IR US GUIDE BX ASP/DRAIN  11/07/2022   IRRIGATION AND DEBRIDEMENT ABSCESS     PERIPHERAL VASCULAR BALLOON ANGIOPLASTY  08/15/2019   Procedure: PERIPHERAL VASCULAR BALLOON ANGIOPLASTY;  Surgeon: [name], MD;  Location: [place] [facility];  Service: Cardiovascular;;   PERIPHERAL VASCULAR BALLOON ANGIOPLASTY Left 02/06/2022   Procedure: PERIPHERAL VASCULAR BALLOON ANGIOPLASTY;  Surgeon: [name], MD;  Location: [place] [facility];  Service: Cardiovascular;  Laterality: Left;   REVISON OF ARTERIOVENOUS FISTULA Left 07/19/2020    Procedure: REVISON/PLICATION OF ARTERIOVENOUS FISTULA LEFT;  Surgeon: [name], MD;  Location: [facility] OR;  Service: Vascular;  Laterality: Left;   THROMBECTOMY AND REVISION OF ARTERIOVENTOUS (AV) GORETEX  GRAFT Left 08/13/2019   Procedure: THROMBECTOMY OF LEFT ARM ARTERIOVENTOUS (AV) FISTULA;  Surgeon: [name], MD;  Location: [facility];  Service: Vascular;  Laterality: Left;   TOE AMPUTATION      SOCIAL HISTORY: Social History   Socioeconomic History   Marital status: Divorced    Spouse name: Not on file   Number of children: Not on file   Years of education: Not on file   Highest education level: Not on file  Occupational History   Not on file  Tobacco Use   Smoking status: Never   Smokeless tobacco: Never  Vaping Use   Vaping Use: Never used  Substance and Sexual Activity   Alcohol use: Not Currently   Drug use: Never   Sexual activity: Not on file  Other Topics Concern   Not on file  Social History Narrative   Not on file   Social Determinants of Health   Financial Resource Strain: Not on file  Food Insecurity: No  Food Insecurity (10/29/2022)   Hunger Vital Sign    Worried About Running Out of Food in the Last Year: Never true    Ran Out of Food in the Last Year: Never true  Transportation Needs: No Transportation Needs (10/29/2022)   PRAPARE - Hydrologist (Medical): No    Lack of Transportation (Non-Medical): No  Physical Activity: Not on file  Stress: Not on file  Social Connections: Not on file  Intimate Partner Violence: Not At Risk (10/29/2022)   Humiliation, Afraid, Rape, and Kick questionnaire    Fear of Current or Ex-Partner: No    Emotionally Abused: No    Physically Abused: No    Sexually Abused: No    FAMILY HISTORY: Family History  Problem Relation Age of Onset   Hypertension Mother     ALLERGIES:  is allergic to sulfa antibiotics.  MEDICATIONS:  Current Outpatient Medications  Medication Sig  Dispense Refill   amLODipine (NORVASC) 5 MG tablet Take 5 mg by mouth daily.     aspirin EC 81 MG EC tablet Take 1 tablet (81 mg total) by mouth daily. Swallow whole. 30 tablet 11   benzonatate (TESSALON) 200 MG capsule Take 200 mg by mouth See admin instructions. Take one capsule by mouth two times a day every Tue, Thu, Sat per [facility].     ethyl chloride spray Apply 1 Application topically Every Tuesday,Thursday,and Saturday with dialysis.     famotidine (PEPCID) 20 MG tablet Take 20 mg by mouth at bedtime.     gabapentin (NEURONTIN) 100 MG capsule Take 100 mg by mouth at bedtime.     guaifenesin (HUMIBID E) 400 MG TABS tablet Take 400 mg by mouth in the morning, at noon, and at bedtime.     guaiFENesin (ROBITUSSIN) 100 MG/5ML liquid Take 200 mg by mouth every 4 (four) hours as needed for cough.     insulin glargine (LANTUS) 100 UNIT/ML injection Inject 0.25 mLs (25 Units total) into the skin at bedtime. 10 mL 0   ipratropium-albuterol (DUONEB) 0.5-2.5 (3) MG/3ML SOLN Take 3 mLs by nebulization every 4 (four) hours as needed (for wheezing or shortness of breath).     loperamide (IMODIUM) 2 MG capsule Take 1 capsule (2 mg total) by mouth as needed for diarrhea or loose stools. (Patient taking differently: Take 2 mg by mouth daily as needed for diarrhea or loose stools.) 30 capsule 0   Menthol, Topical Analgesic, (BIOFREEZE) 4 % GEL Apply 1 Application topically every 4 (four) hours as needed (for pain).     Multiple Vitamin (MULTIVITAMIN WITH MINERALS) TABS tablet Take 1 tablet by mouth daily.     ondansetron (ZOFRAN) 4 MG tablet Take 4 mg by mouth every 6 (six) hours as needed for nausea or vomiting.     pantoprazole (PROTONIX) 20 MG tablet Take 20 mg by mouth See admin instructions. Take 10m by mouth on mon, wed, fri and sun.     senna-docusate (SENOKOT-S) 8.6-50 MG tablet Take 1 tablet by mouth at bedtime as needed for mild constipation.     sevelamer (RENAGEL) 800 MG tablet Take 2,400 mg by mouth  See admin instructions. Take 24037mby mouth three times daily on mon, wed, fri and sun.     sevelamer carbonate (RENVELA) 800 MG tablet Take 2,400 mg by mouth See admin instructions. Take 240054my mouth twice daily on Tues, Thurs, and Saturdays.     Simethicone 80 MG TABS Take 160 mg by mouth  every 8 (eight) hours as needed (for gas).     No current facility-administered medications for this visit.    REVIEW OF SYSTEMS:    10 Point review of Systems was done is negative except as noted above.  PHYSICAL EXAMINATION: PHONE VISIT  LABORATORY DATA:  I have reviewed the data as listed .    Latest Ref Rng & Units 12/17/2022   12:42 PM 11/05/2022    2:43 AM 11/04/2022    9:13 AM  CBC  WBC 4.0 - 10.5 K/uL 7.4  8.2  8.1   Hemoglobin 13.0 - 17.0 g/dL 13.0  10.5  10.3   Hematocrit 39.0 - 52.0 % 39.1  31.6  29.8   Platelets 150 - 400 K/uL 272  439  436    .    Latest Ref Rng & Units 11/20/2022   12:44 PM 11/20/2022   11:44 AM 11/08/2022    8:26 AM  CMP  Glucose 70 - 99 mg/dL   128   BUN 6 - 20 mg/dL   39   Creatinine 0.61 - 1.24 mg/dL   9.74   Sodium 135 - 145 mmol/L   134   Potassium 3.5 - 5.1 mmol/L   4.2   Chloride 98 - 111 mmol/L   94   CO2 22 - 32 mmol/L   28   Calcium 8.9 - 10.3 mg/dL   9.1   Total Protein 6.0 - 8.3 g/dL 8.2  8.1    8.2  7.9   Total Bilirubin 0.2 - 1.2 mg/dL 3.3   5.2   Alkaline Phos 39 - 117 U/L 268   230   AST 0 - 37 U/L 25   42   ALT 0 - 53 U/L 19   28     RADIOGRAPHIC STUDIES: I have personally reviewed the radiological images as listed and agreed with the findings in the report. No results found.  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 Hemochromatosis testing neg Likely ferritin leak related to liver inflammation and as an acute  pahse reactant. Cannot r/o 2 nd hemosiderosis due to chronic inflammatory liver disease.  Marland KitchenHemochromatosis DNA-PCR(c282y,h63d) Order: [id number] Status: Final result     Visible to patient: Yes (not seen)     Next appt: 12/29/2022 at 08:40 AM in Oncology Sullivan Lone, MD)   0 Result Notes    Component 1 mo ago  DNA Mutation Analysis Comment  Comment: (NOTE) Result: c.845G>A (p.Cys282Tyr) - Not Detected c.187C>G (p.His63Asp) - Not Detected c.193A>T (p.Ser65Cys) - Not Detected     PLAN: -Discussed lab results from 12/17/2022 with the patient's daughter. CBC were normal. Iron saturation at 38%. Ferritin was at 1,481. IgG level elevated at 3,632. Sedimentation rate at 84. Kappa/lambda right chain ratio was 1.68. -Discussed the results of genetic testing, which showed HFE gene mutations neg for hereditary hemochromatosis. -Patient does not have primary bone marrow abnormalities with lab results.  -Educated the patient's daughter that inflammation can be caused by liver inflammation.  -Patient cannot get phlebotomy right now due to dialysis and resultant anemia. -nephrologist will need to adjustment IV iron replacement with mindfullness regarding iron labs.   FOLLOW-UP: RTC with PCP, GI and nephrology  The total time spent in the appointment was 10 minutes* .  All of the patient's questions were answered  with apparent satisfaction. The patient knows to call the clinic with any problems, questions or concerns.   Sullivan Lone MD MS AAHIVMS [facility] Hematology/Oncology Physician [facility] : [facility]  .*Total Encounter Time as defined by the Centers for Medicare and Medicaid Services includes, in addition to the face-to-face time of a patient visit (documented in the note above) non-face-to-face time: obtaining and reviewing outside history, ordering and reviewing medications, tests or procedures, care coordination (communications with other health care professionals or caregivers)  and documentation in the medical record.   I, Cleda Mccreedy, am acting as a Education administrator for Sullivan Lone, MD. .I have reviewed the above documentation for accuracy and completeness, and I agree with the above. Brunetta Genera MD

## 2023-01-09 ENCOUNTER — Ambulatory Visit: Payer: Medicare Other | Admitting: Family

## 2023-02-23 NOTE — Telephone Encounter (Signed)
Rec'd fax from [facility] that patient NO Showed his appointment with them

## 2023-03-17 ENCOUNTER — Encounter (HOSPITAL_COMMUNITY): Payer: Self-pay | Admitting: *Deleted

## 2023-03-17 ENCOUNTER — Emergency Department (HOSPITAL_COMMUNITY)
Admission: EM | Admit: 2023-03-17 | Discharge: 2023-03-17 | Disposition: A | Discharge status: Home / Self Care | Payer: Medicare Other | Attending: Emergency Medicine | Admitting: Emergency Medicine

## 2023-03-17 DIAGNOSIS — Z79899 Other long term (current) drug therapy: Secondary | ICD-10-CM | POA: Diagnosis not present

## 2023-03-17 DIAGNOSIS — N186 End stage renal disease: Secondary | ICD-10-CM | POA: Insufficient documentation

## 2023-03-17 DIAGNOSIS — Z7982 Long term (current) use of aspirin: Secondary | ICD-10-CM | POA: Diagnosis not present

## 2023-03-17 DIAGNOSIS — L02412 Cutaneous abscess of left axilla: Secondary | ICD-10-CM | POA: Insufficient documentation

## 2023-03-17 DIAGNOSIS — Z7984 Long term (current) use of oral hypoglycemic drugs: Secondary | ICD-10-CM | POA: Insufficient documentation

## 2023-03-17 DIAGNOSIS — E1122 Type 2 diabetes mellitus with diabetic chronic kidney disease: Secondary | ICD-10-CM | POA: Diagnosis not present

## 2023-03-17 DIAGNOSIS — I132 Hypertensive heart and chronic kidney disease with heart failure and with stage 5 chronic kidney disease, or end stage renal disease: Secondary | ICD-10-CM | POA: Diagnosis not present

## 2023-03-17 DIAGNOSIS — Z992 Dependence on renal dialysis: Secondary | ICD-10-CM | POA: Diagnosis not present

## 2023-03-17 DIAGNOSIS — I509 Heart failure, unspecified: Secondary | ICD-10-CM | POA: Diagnosis not present

## 2023-03-17 MED ORDER — OXYCODONE HCL 5 MG PO TABS
10.0000 mg | ORAL_TABLET | Freq: Once | ORAL | Status: AC
  Administered 2023-03-17: 10 mg via ORAL
  Filled 2023-03-17: qty 2

## 2023-03-17 MED ORDER — OXYCODONE HCL 5 MG PO TABS: 5.0000 mg | ORAL_TABLET | ORAL | 0 refills | Status: DC | PRN

## 2023-03-17 MED ORDER — DOXYCYCLINE HYCLATE 100 MG PO CAPS: 100.0000 mg | ORAL_CAPSULE | Freq: Two times a day (BID) | ORAL | 0 refills | Status: DC

## 2023-03-17 MED ORDER — LIDOCAINE-EPINEPHRINE (PF) 2 %-1:200000 IJ SOLN
20.0000 mL | Freq: Once | INTRAMUSCULAR | Status: AC
  Administered 2023-03-17: 20 mL
  Filled 2023-03-17: qty 20

## 2023-03-17 MED ORDER — PENTAFLUOROPROP-TETRAFLUOROETH EX AERO
INHALATION_SPRAY | CUTANEOUS | Status: DC | PRN
  Filled 2023-03-17: qty 30

## 2023-03-17 MED ORDER — DOXYCYCLINE HYCLATE 100 MG PO TABS
100.0000 mg | ORAL_TABLET | Freq: Once | ORAL | Status: AC
  Administered 2023-03-17: 100 mg via ORAL
  Filled 2023-03-17: qty 1

## 2023-03-17 NOTE — ED Provider Notes (Signed)
[place] EMERGENCY DEPARTMENT AT [facility] Provider Note   CSN: [id number] Arrival date & time: 03/17/23  1303     History  Chief Complaint  Patient presents with   Abscess    [name] DECARLO Sr. is a 62 y.o. male.  Patient presents the emergency department complaining of possible abscess under his left armpit.  Patient states this has been developing for 2 to 3 weeks.  [name] [name] states [name] [name] tried to place a needle in the area yesterday with no resulting drainage.  [name] [name] denies fevers, nausea, shortness of breath.  Patient is a hemodialysis patient and had his full treatment yesterday.  Past medical history otherwise significant for CHF, history of MI, end-stage renal disease on hemodialysis, type II DM, hypertension  HPI     Home Medications Prior to Admission medications   Medication Sig Start Date End Date Taking? Authorizing Provider  doxycycline (VIBRAMYCIN) 100 MG capsule Take 1 capsule (100 mg total) by mouth 2 (two) times daily. 03/17/23  Yes Darrick Grinder, PA-C  oxyCODONE (ROXICODONE) 5 MG immediate release tablet Take 1 tablet (5 mg total) by mouth every 4 (four) hours as needed for severe pain. 03/17/23  Yes Barrie Dunker B, PA-C  amLODipine (NORVASC) 5 MG tablet Take 5 mg by mouth daily. 07/15/22   [provider]  aspirin EC 81 MG EC tablet Take 1 tablet (81 mg total) by mouth daily. Swallow whole. 02/21/22   Marguerita Merles Latif, DO  benzonatate (TESSALON) 200 MG capsule Take 200 mg by mouth See admin instructions. Take one capsule by mouth two times a day every Tue, Thu, Sat per [facility].    [provider]  ethyl chloride spray Apply 1 Application topically Every Tuesday,Thursday,and Saturday with dialysis.    [provider]  famotidine (PEPCID) 20 MG tablet Take 20 mg by mouth at bedtime.    [provider]  gabapentin (NEURONTIN) 100 MG capsule Take 100 mg by mouth at bedtime.    [provider]  guaifenesin (HUMIBID E) 400 MG  TABS tablet Take 400 mg by mouth in the morning, at noon, and at bedtime.    [provider]  guaiFENesin (ROBITUSSIN) 100 MG/5ML liquid Take 200 mg by mouth every 4 (four) hours as needed for cough.    [provider]  insulin glargine (LANTUS) 100 UNIT/ML injection Inject 0.25 mLs (25 Units total) into the skin at bedtime. 11/08/22   Hughie Closs, MD  ipratropium-albuterol (DUONEB) 0.5-2.5 (3) MG/3ML SOLN Take 3 mLs by nebulization every 4 (four) hours as needed (for wheezing or shortness of breath).    [provider]  loperamide (IMODIUM) 2 MG capsule Take 1 capsule (2 mg total) by mouth as needed for diarrhea or loose stools. Patient taking differently: Take 2 mg by mouth daily as needed for diarrhea or loose stools. 07/08/22   Rodolph Bong, MD  Menthol, Topical Analgesic, (BIOFREEZE) 4 % GEL Apply 1 Application topically every 4 (four) hours as needed (for pain).    [provider]  Multiple Vitamin (MULTIVITAMIN WITH MINERALS) TABS tablet Take 1 tablet by mouth daily.    [provider]  ondansetron (ZOFRAN) 4 MG tablet Take 4 mg by mouth every 6 (six) hours as needed for nausea or vomiting. 06/30/22   [provider]  pantoprazole (PROTONIX) 20 MG tablet Take 20 mg by mouth See admin instructions. Take 20mg  by mouth on mon, wed, fri and sun.    [provider]  senna-docusate (SENOKOT-S) 8.6-50 MG tablet Take 1 tablet by mouth at bedtime as needed for mild constipation. 07/08/22   Rodolph Bong, MD  sevelamer (RENAGEL) 800 MG tablet Take 2,400 mg by mouth See admin instructions. Take 2400mg  by mouth three times daily on mon, wed, fri and sun.    [provider]  sevelamer carbonate (RENVELA) 800 MG tablet Take 2,400 mg by mouth See admin instructions. Take 2400mg  by mouth twice daily on Tues, Thurs, and Saturdays.    [provider]  Simethicone 80 MG TABS Take 160 mg by mouth every 8 (eight) hours as  needed (for gas).    [provider]      Allergies    Sulfa antibiotics    Review of Systems   Review of Systems  Physical Exam Updated Vital Signs BP (!) 110/57 (BP Location: Right Arm)   Pulse 84   Temp 98.9 F (37.2 C) (Oral)   Resp 18   SpO2 96%  Physical Exam Vitals and nursing note reviewed.  HENT:     Head: Normocephalic and atraumatic.  Eyes:     Pupils: Pupils are equal, round, and reactive to light.  Pulmonary:     Effort: Pulmonary effort is normal. No respiratory distress.  Musculoskeletal:        General: No signs of injury.     Cervical back: Normal range of motion.  Skin:    General: Skin is dry.     Comments: Large indurated fluctuant area under left axillary region with white appearing center.  Appears consistent with abscess.  Neurological:     Mental Status: [name] [name] is alert.  Psychiatric:        Speech: Speech normal.        Behavior: Behavior normal.     ED Results / Procedures / Treatments   Labs (all labs ordered are listed, but only abnormal results are displayed) Labs Reviewed - No data to display  EKG None  Radiology No results found.  Procedures .Marland KitchenIncision and Drainage  Date/Time: 03/17/2023 2:38 PM  Performed by: Darrick Grinder, PA-C Authorized by: Darrick Grinder, PA-C   Consent:    Consent obtained:  Verbal   Consent given by:  Patient   Risks, benefits, and alternatives were discussed: yes     Risks discussed:  Bleeding, incomplete drainage, pain, infection and damage to other organs   Alternatives discussed:  No treatment and alternative treatment Universal protocol:    Procedure explained and questions answered to patient or proxy's satisfaction: yes     Immediately prior to procedure, a time out was called: yes     Patient identity confirmed:  Verbally with patient Location:    Type:  Abscess   Size:  5cm x 6cm x 2.5cm   Location: Left axillary region. Pre-procedure details:    Skin preparation:   Povidone-iodine Sedation:    Sedation type:  None Anesthesia:    Anesthesia method:  Topical application and local infiltration   Topical anesthesia: Gebauers.   Local anesthetic:  Lidocaine 2% WITH epi Procedure type:    Complexity:  Complex Procedure details:    Incision types:  Cruciate   Wound management:  Probed and deloculated, irrigated with saline and extensive cleaning   Drainage:  Purulent   Drainage amount:  Copious   Wound treatment:  Wound left open Post-procedure details:    Procedure completion:  Tolerated well, no immediate complications     Medications Ordered in ED Medications  pentafluoroprop-tetrafluoroeth (  GEBAUERS) aerosol (has no administration in time range)  doxycycline (VIBRA-TABS) tablet 100 mg (has no administration in time range)  oxyCODONE (Oxy IR/ROXICODONE) immediate release tablet 10 mg (has no administration in time range)  lidocaine-EPINEPHrine (XYLOCAINE W/EPI) 2 %-1:200000 (PF) injection 20 mL (20 mLs Infiltration Given by Other 03/17/23 1445)    ED Course/ Medical Decision Making/ A&P                             Medical Decision Making Risk Prescription drug management.   Patient presents the emergency department the chief complaint of swelling under the left axillary region.  Differential diagnosis includes but is not limited to abscess, cysts, others  The patient is comorbidities including previous abscesses  The patient is afebrile with vitals within normal limits.  I see no indication at this time for lab work or imaging  Presentation is consistent with an abscess.  Incision and drainage was performed.  Please see the procedure note.  Copious amount of drainage achieved.  The patient was administered oxycodone for pain and doxycycline. His pain improved after medication administration.   Plan to discharge patient home with antibiotics and a short course of pain medication.  Patient understands there may be need for further  procedures.  Return precautions provided.        Final Clinical Impression(s) / ED Diagnoses Final diagnoses:  Abscess of left axilla    Rx / DC Orders ED Discharge Orders          Ordered    oxyCODONE (ROXICODONE) 5 MG immediate release tablet  Every 4 hours PRN        03/17/23 1445    doxycycline (VIBRAMYCIN) 100 MG capsule  2 times daily        03/17/23 1445              Pamala Duffel 03/17/23 1445    Terrilee Files, MD 03/17/23 [phone]

## 2023-03-17 NOTE — ED Notes (Signed)
Pt states, "I took ten 500 mg Advil last night and at 8am this morning."

## 2023-03-17 NOTE — Discharge Instructions (Addendum)
You were treated today for an abscess. Incision and drainage was performed. I have prescribed an antibiotic to be taken until complete. Please continue to allow the area to drain and continue to use warm compresses at home. Follow up as needed.

## 2023-03-17 NOTE — ED Triage Notes (Signed)
Pt in c/o L arm abscess onset x 2 wks, pt states, "I put a needle in it and some creme on it but it isn't getting any better", pt on HD and had full tx yesterday, denies fever & chills

## 2023-03-17 NOTE — ED Notes (Signed)
Left underarm dressed with telfa and 4x4.  Draining only small amts.  Pt on phone constant

## 2023-03-20 ENCOUNTER — Emergency Department (HOSPITAL_COMMUNITY)
Admission: EM | Admit: 2023-03-20 | Discharge: 2023-03-20 | Disposition: A | Discharge status: Home / Self Care | Payer: Medicare Other | Attending: Student | Admitting: Student

## 2023-03-20 ENCOUNTER — Encounter (HOSPITAL_COMMUNITY): Payer: Self-pay | Admitting: Emergency Medicine

## 2023-03-20 DIAGNOSIS — Z794 Long term (current) use of insulin: Secondary | ICD-10-CM | POA: Diagnosis not present

## 2023-03-20 DIAGNOSIS — I120 Hypertensive chronic kidney disease with stage 5 chronic kidney disease or end stage renal disease: Secondary | ICD-10-CM | POA: Diagnosis not present

## 2023-03-20 DIAGNOSIS — Z4800 Encounter for change or removal of nonsurgical wound dressing: Secondary | ICD-10-CM | POA: Insufficient documentation

## 2023-03-20 DIAGNOSIS — Z5189 Encounter for other specified aftercare: Secondary | ICD-10-CM

## 2023-03-20 DIAGNOSIS — N186 End stage renal disease: Secondary | ICD-10-CM | POA: Diagnosis not present

## 2023-03-20 DIAGNOSIS — Z7982 Long term (current) use of aspirin: Secondary | ICD-10-CM | POA: Diagnosis not present

## 2023-03-20 DIAGNOSIS — Z992 Dependence on renal dialysis: Secondary | ICD-10-CM | POA: Insufficient documentation

## 2023-03-20 DIAGNOSIS — E1122 Type 2 diabetes mellitus with diabetic chronic kidney disease: Secondary | ICD-10-CM | POA: Insufficient documentation

## 2023-03-20 DIAGNOSIS — Z79899 Other long term (current) drug therapy: Secondary | ICD-10-CM | POA: Insufficient documentation

## 2023-03-20 NOTE — ED Provider Notes (Signed)
[place] EMERGENCY DEPARTMENT AT [facility] Provider Note   CSN: [id number] Arrival date & time: 03/20/23  1649     History  Chief Complaint  Patient presents with   Wound Check    [name] [name] Sr. is a 62 y.o. male.  History of hypertension, diabetes, ESRD on dialysis.  Presents the ED today for wound check.  He had I&D of abscess of left axilla 2 days ago.  He states today one of the areas was bleeding at dialysis and would not stop so they sent him to the emergency department.   Wound Check       Home Medications Prior to Admission medications   Medication Sig Start Date End Date Taking? Authorizing Provider  amLODipine (NORVASC) 5 MG tablet Take 5 mg by mouth daily. 07/15/22   [provider]  aspirin EC 81 MG EC tablet Take 1 tablet (81 mg total) by mouth daily. Swallow whole. 02/21/22   Marguerita Merles Latif, DO  benzonatate (TESSALON) 200 MG capsule Take 200 mg by mouth See admin instructions. Take one capsule by mouth two times a day every Tue, Thu, Sat per [facility].    [provider]  doxycycline (VIBRAMYCIN) 100 MG capsule Take 1 capsule (100 mg total) by mouth 2 (two) times daily. 03/17/23   Darrick Grinder, PA-C  ethyl chloride spray Apply 1 Application topically Every Tuesday,Thursday,and Saturday with dialysis.    [provider]  famotidine (PEPCID) 20 MG tablet Take 20 mg by mouth at bedtime.    [provider]  gabapentin (NEURONTIN) 100 MG capsule Take 100 mg by mouth at bedtime.    [provider]  guaifenesin (HUMIBID E) 400 MG TABS tablet Take 400 mg by mouth in the morning, at noon, and at bedtime.    [provider]  guaiFENesin (ROBITUSSIN) 100 MG/5ML liquid Take 200 mg by mouth every 4 (four) hours as needed for cough.    [provider]  insulin glargine (LANTUS) 100 UNIT/ML injection Inject 0.25 mLs (25 Units total) into the skin at bedtime. 11/08/22   Hughie Closs, MD   ipratropium-albuterol (DUONEB) 0.5-2.5 (3) MG/3ML SOLN Take 3 mLs by nebulization every 4 (four) hours as needed (for wheezing or shortness of breath).    [provider]  loperamide (IMODIUM) 2 MG capsule Take 1 capsule (2 mg total) by mouth as needed for diarrhea or loose stools. Patient taking differently: Take 2 mg by mouth daily as needed for diarrhea or loose stools. 07/08/22   Rodolph Bong, MD  Menthol, Topical Analgesic, (BIOFREEZE) 4 % GEL Apply 1 Application topically every 4 (four) hours as needed (for pain).    [provider]  Multiple Vitamin (MULTIVITAMIN WITH MINERALS) TABS tablet Take 1 tablet by mouth daily.    [provider]  ondansetron (ZOFRAN) 4 MG tablet Take 4 mg by mouth every 6 (six) hours as needed for nausea or vomiting. 06/30/22   [provider]  oxyCODONE (ROXICODONE) 5 MG immediate release tablet Take 1 tablet (5 mg total) by mouth every 4 (four) hours as needed for severe pain. 03/17/23   Darrick Grinder, PA-C  pantoprazole (PROTONIX) 20 MG tablet Take 20 mg by mouth See admin instructions. Take 20mg  by mouth on mon, wed, fri and sun.    [provider]  senna-docusate (SENOKOT-S) 8.6-50 MG tablet Take 1 tablet by mouth at bedtime as needed for mild constipation. 07/08/22   Rodolph Bong, MD  sevelamer (RENAGEL) 800 MG tablet Take 2,400 mg by mouth See admin instructions. Take 2400mg  by mouth three times daily on mon, wed, fri and sun.    [provider]  sevelamer carbonate (RENVELA) 800 MG tablet Take 2,400 mg by mouth See admin instructions. Take 2400mg  by mouth twice daily on Tues, Thurs, and Saturdays.    [provider]  Simethicone 80 MG TABS Take 160 mg by mouth every 8 (eight) hours as needed (for gas).    [provider]      Allergies    Sulfa antibiotics    Review of Systems   Review of Systems  Physical Exam Updated Vital Signs BP 116/89   Pulse 81   Temp 98.1 F  (36.7 C) (Oral)   Resp 18   SpO2 96%  Physical Exam Vitals and nursing note reviewed.  Constitutional:      General: He is not in acute distress.    Appearance: He is well-developed.  HENT:     Head: Normocephalic and atraumatic.     Mouth/Throat:     Mouth: Mucous membranes are moist.  Eyes:     Conjunctiva/sclera: Conjunctivae normal.  Cardiovascular:     Rate and Rhythm: Normal rate and regular rhythm.  Pulmonary:     Effort: Pulmonary effort is normal. No respiratory distress.     Breath sounds: Normal breath sounds.  Abdominal:     Palpations: Abdomen is soft.     Tenderness: There is no abdominal tenderness.  Musculoskeletal:        General: No swelling.     Cervical back: Neck supple.  Skin:    General: Skin is warm and dry.     Capillary Refill: Capillary refill takes less than 2 seconds.     Comments: Areas of healing incision and drainage left axilla, no purulent drainage, no surrounding cellulitis.  1 small area of blood oozing stopped with pressure and Surgicel  Neurological:     General: No focal deficit present.     Mental Status: He is alert and oriented to person, place, and time.  Psychiatric:        Mood and Affect: Mood normal.      ED Results / Procedures / Treatments   Labs (all labs ordered are listed, but only abnormal results are displayed) Labs Reviewed - No data to display  EKG None  Radiology No results found.  Procedures Procedures    Medications Ordered in ED Medications - No data to display  ED Course/ Medical Decision Making/ A&P                             Medical Decision Making This patient presents to the ED for concern of bleeding from area of incision and drainage performed 2 days ago, this involves an extensive number of treatment options, and is a complaint that carries with it a high risk of complications and morbidity.  The differential diagnosis includes abscess, cyst, coagulopathy, other   Co morbidities that  complicate the patient evaluation  ESRD on dialysis   Additional history obtained:  Additional history obtained from EMR External records from outside source obtained and reviewed including Recent ED note    Problem List / ED Course / Critical interventions / Medication management  Comes in for bleeding from area of recent incision and drainage, he states that started bleeding at dialysis and would not stop.  He is on aspirin but  no other anticoagulants.  Several areas of recent incision from the abscess but only 1 small area of bleeding stopped here with pressure.  Dressed with Surgicel and gauze and tape it.  No signs of cellulitis.  Patient advised to continue antibiotics and follow-up with general surgery to ensure healing.  I have reviewed the patients home medicines and have made adjustments as needed   Social Determinants of Health:  Pt on dialysis   This chart has been completed using Engineer, civil (consulting) software, and while attempts have been made to ensure accuracy, certain words and phrases may not be transcribed as intended.               Final Clinical Impression(s) / ED Diagnoses Final diagnoses:  Visit for wound check    Rx / DC Orders ED Discharge Orders     None         Josem Kaufmann 03/20/23 1745    Bethann Berkshire, MD 03/21/23 1237

## 2023-03-20 NOTE — ED Notes (Signed)
Area cleaned and dressing applied 

## 2023-03-20 NOTE — Discharge Instructions (Signed)
Seen today for a wound recheck.  The small area that was bleeding has stopped.  We placed a Surgicel on this to help ensure that the bleeding does not continue.  Follow-up with general surgery for a wound check.  Finish your antibiotics.  Come back to the ER for new or worsening symptoms.

## 2023-03-20 NOTE — ED Triage Notes (Signed)
Pt was here on wednesday this week and had an abscess drained , said  it started bleeding on Thursday and wanted to get it checked out ,

## 2023-03-31 ENCOUNTER — Ambulatory Visit (INDEPENDENT_AMBULATORY_CARE_PROVIDER_SITE_OTHER): Payer: Medicare Other | Admitting: Surgery

## 2023-03-31 ENCOUNTER — Encounter: Payer: Self-pay | Admitting: Surgery

## 2023-03-31 VITALS — BP 179/110 | HR 95 | Temp 98.0°F | Resp 12 | Ht 73.0 in | Wt 240.0 lb

## 2023-03-31 DIAGNOSIS — I10 Essential (primary) hypertension: Secondary | ICD-10-CM

## 2023-03-31 DIAGNOSIS — S41102A Unspecified open wound of left upper arm, initial encounter: Secondary | ICD-10-CM | POA: Diagnosis not present

## 2023-03-31 NOTE — Patient Instructions (Signed)
Wound Care Instructions:  -Change dressing to left armpit daily -Remove packing either before or while in shower -May allow soapy water to run overtop of the area -Pat dry after bathing -Pack with iodoform gauze.  Cover with 4x4 and tape -Present to ED if begin to have pus drainage, worsening pain/redness, or fever/chills

## 2023-03-31 NOTE — Progress Notes (Unsigned)
[facility] History and Physical  Reason for Referral:*** Referring Physician: ***  Chief Complaint   New Patient (Initial Visit)     [name] ESSE Sr. is a 62 y.o. male.  HPI: ***.  The *** started *** and has had a duration of ***.  It is associated with ***.  The *** is improved with ***, and is made worse with ***.    Quality*** Context***  Past Medical History:  Diagnosis Date   CHF (congestive heart failure) (HCC)    ECHO 02/05/22  EF 45-50%, DD indeterminent   Diabetes mellitus without complication (HCC)    type 2   ESRD on hemodialysis (HCC)    mon wed fri   Hypertension    Kidney failure    Myocardial infarction (HCC)    Nonischemic cardiomyopathy [facility])     Past Surgical History:  Procedure Laterality Date   A/V FISTULAGRAM N/A 08/15/2019   Procedure: A/V FISTULAGRAM;  Surgeon: Maeola Harman, MD;  Location: [facility] INVASIVE CV LAB;  Service: Cardiovascular;  Laterality: N/A;   AMPUTATION Right 02/02/2022   Procedure: AMPUTATION 5th RAY;  Surgeon: Louann Sjogren, MD;  Location: [facility] OR;  Service: Podiatry;  Laterality: Right;   AMPUTATION Right 05/23/2022   Procedure: RIGHT BELOW KNEE AMPUTATION;  Surgeon: Nadara Mustard, MD;  Location: [facility] OR;  Service: Orthopedics;  Laterality: Right;   AMPUTATION Right 07/04/2022   Procedure: REVISION AMPUTATION BELOW KNEE;  Surgeon: Nadara Mustard, MD;  Location: [facility] OR;  Service: Orthopedics;  Laterality: Right;   APPLICATION OF WOUND VAC  05/23/2022   Procedure: APPLICATION OF WOUND VAC;  Surgeon: Nadara Mustard, MD;  Location: [facility] OR;  Service: Orthopedics;;   AV FISTULA PLACEMENT     CARDIAC CATHETERIZATION  02/17/2018   LHC 02/17/18 ([name]): 30% pLAD, 30% dRCA, LVEDP 25-30, LVEF 25%.     CATARACT EXTRACTION W/PHACO Right 03/29/2020   Procedure: CATARACT EXTRACTION PHACO AND INTRAOCULAR LENS PLACEMENT (IOC)  RIGHT DIABETIC;  Surgeon: Lockie Mola, MD;  Location: [facility] ORS;  Service:  Ophthalmology;  Laterality: Right;  Lot # [id number] H [place]: 02:30.9 AP% 9.5% CDE: 14.38   EYE SURGERY Right    cataract removed   I & D EXTREMITY Right 02/02/2022   Procedure: IRRIGATION AND DEBRIDEMENT RIGHT FOOT;  Surgeon: Louann Sjogren, MD;  Location: [facility] OR;  Service: Podiatry;  Laterality: Right;   INCISION AND DRAINAGE OF WOUND Right 02/08/2022   Procedure: IRRIGATION AND DEBRIDEMENT WOUND;  Surgeon: Asencion Islam, DPM;  Location: [facility] OR;  Service: Podiatry;  Laterality: Right;  with removal of infected bone    INSERTION OF DIALYSIS CATHETER  08/13/2019   Procedure: Insertion Of Dialysis Catheter;  Surgeon: Larina Earthly, MD;  Location: [facility] OR;  Service: Vascular;;   INSERTION OF DIALYSIS CATHETER Left 07/19/2020   Procedure: INSERTION OF DIALYSIS CATHETER;  Surgeon: Maeola Harman, MD;  Location: [facility] OR;  Service: Vascular;  Laterality: Left;   IR US GUIDE BX ASP/DRAIN  11/07/2022   IRRIGATION AND DEBRIDEMENT ABSCESS     PERIPHERAL VASCULAR BALLOON ANGIOPLASTY  08/15/2019   Procedure: PERIPHERAL VASCULAR BALLOON ANGIOPLASTY;  Surgeon: Maeola Harman, MD;  Location: [facility] INVASIVE CV LAB;  Service: Cardiovascular;;   PERIPHERAL VASCULAR BALLOON ANGIOPLASTY Left 02/06/2022   Procedure: PERIPHERAL VASCULAR BALLOON ANGIOPLASTY;  Surgeon: Cephus Shelling, MD;  Location: [facility] INVASIVE CV LAB;  Service: Cardiovascular;  Laterality: Left;   REVISON OF ARTERIOVENOUS FISTULA Left 07/19/2020   Procedure: REVISON/PLICATION OF ARTERIOVENOUS FISTULA  LEFT;  Surgeon: Maeola Harman, MD;  Location: [facility] OR;  Service: Vascular;  Laterality: Left;   THROMBECTOMY AND REVISION OF ARTERIOVENTOUS (AV) GORETEX  GRAFT Left 08/13/2019   Procedure: THROMBECTOMY OF LEFT ARM ARTERIOVENTOUS (AV) FISTULA;  Surgeon: Larina Earthly, MD;  Location: [facility] OR;  Service: Vascular;  Laterality: Left;   TOE AMPUTATION      Family History  Problem Relation Age of Onset   Hypertension Mother     Social  History   Tobacco Use   Smoking status: Never   Smokeless tobacco: Never  Vaping Use   Vaping Use: Never used  Substance Use Topics   Alcohol use: Not Currently   Drug use: Never    Medications: {medication reviewed/display:3041432} Allergies as of 03/31/2023       Reactions   Sulfa Antibiotics Nausea And Vomiting, Other (See Comments)   "Allergic," per [facility]        Medication List        Accurate as of Mar 31, 2023  3:29 PM. If you have any questions, ask your nurse or doctor.          amLODipine 5 MG tablet Commonly known as: NORVASC Take 5 mg by mouth daily.   aspirin EC 81 MG tablet Take 1 tablet (81 mg total) by mouth daily. Swallow whole.   benzonatate 200 MG capsule Commonly known as: TESSALON Take 200 mg by mouth See admin instructions. Take one capsule by mouth two times a day every Tue, Thu, Sat per [facility].   Biofreeze 4 % Gel Generic drug: Menthol (Topical Analgesic) Apply 1 Application topically every 4 (four) hours as needed (for pain).   doxycycline 100 MG capsule Commonly known as: VIBRAMYCIN Take 1 capsule (100 mg total) by mouth 2 (two) times daily.   ethyl chloride spray Apply 1 Application topically Every Tuesday,Thursday,and Saturday with dialysis.   famotidine 20 MG tablet Commonly known as: PEPCID Take 20 mg by mouth at bedtime.   gabapentin 100 MG capsule Commonly known as: NEURONTIN Take 100 mg by mouth at bedtime.   guaiFENesin 100 MG/5ML liquid Commonly known as: ROBITUSSIN Take 200 mg by mouth every 4 (four) hours as needed for cough.   guaifenesin 400 MG Tabs tablet Commonly known as: HUMIBID E Take 400 mg by mouth in the morning, at noon, and at bedtime.   insulin glargine 100 UNIT/ML injection Commonly known as: LANTUS Inject 0.25 mLs (25 Units total) into the skin at bedtime.   ipratropium-albuterol 0.5-2.5 (3) MG/3ML Soln Commonly known as: DUONEB Take 3 mLs by nebulization every 4 (four) hours as needed (for  wheezing or shortness of breath).   loperamide 2 MG capsule Commonly known as: IMODIUM Take 1 capsule (2 mg total) by mouth as needed for diarrhea or loose stools. What changed: when to take this   multivitamin with minerals Tabs tablet Take 1 tablet by mouth daily.   ondansetron 4 MG tablet Commonly known as: ZOFRAN Take 4 mg by mouth every 6 (six) hours as needed for nausea or vomiting.   oxyCODONE 5 MG immediate release tablet Commonly known as: Roxicodone Take 1 tablet (5 mg total) by mouth every 4 (four) hours as needed for severe pain.   pantoprazole 20 MG tablet Commonly known as: PROTONIX Take 20 mg by mouth See admin instructions. Take 20mg  by mouth on mon, wed, fri and sun.   senna-docusate 8.6-50 MG tablet Commonly known as: Senokot-S Take 1 tablet by mouth at bedtime as needed  for mild constipation.   sevelamer 800 MG tablet Commonly known as: RENAGEL Take 2,400 mg by mouth See admin instructions. Take 2400mg  by mouth three times daily on mon, wed, fri and sun.   sevelamer carbonate 800 MG tablet Commonly known as: RENVELA Take 2,400 mg by mouth See admin instructions. Take 2400mg  by mouth twice daily on Tues, Thurs, and Saturdays.   Simethicone 80 MG Tabs Take 160 mg by mouth every 8 (eight) hours as needed (for gas).         ROS:  {Review of Systems:30496}  Blood pressure (!) 179/110, pulse 95, temperature 98 F (36.7 C), temperature source Oral, resp. rate 12, height 6\' 1"  (1.854 m), weight 240 lb (108.9 kg), SpO2 97 %. Physical Exam  Results: No results found for this or any previous visit (from the past 48 hour(s)).  No results found.   Assessment & Plan:  [name] HERZFELD Sr. is a 62 y.o. male with *** -*** -*** -Follow up ***  All questions were answered to the satisfaction of the patient and family***.  The risk and benefits of *** were discussed including but not limited to ***.  After careful consideration, AVEREE BUTH Sr. has  decided to ***.    Mohd Clemons A Jasiah Buntin 03/31/2023, 3:29 PM   Theophilus Kinds, DO [facility] [address] [place], [place] [address] [phone] (office)

## 2023-04-14 ENCOUNTER — Ambulatory Visit (INDEPENDENT_AMBULATORY_CARE_PROVIDER_SITE_OTHER): Payer: Medicare Other | Admitting: Surgery

## 2023-04-14 ENCOUNTER — Encounter: Payer: Self-pay | Admitting: Surgery

## 2023-04-14 VITALS — BP 160/75 | HR 62 | Temp 97.6°F | Resp 14 | Ht 73.0 in | Wt 240.0 lb

## 2023-04-14 DIAGNOSIS — S41102D Unspecified open wound of left upper arm, subsequent encounter: Secondary | ICD-10-CM

## 2023-04-14 NOTE — Progress Notes (Signed)
[facility] Note   HPI:  62 y.o. Male presents to clinic for follow-up evaluation of left axillary incision and drainage site and subsequent wound debridement.  Patient has overall been doing well.  He denies significant pain or drainage from the area.  He stopped undergoing packing yesterday, as they were unable to put a significant amount of packing in his wound.  Denies fevers and chills.  Review of Systems:  All other review of systems: otherwise negative   Vital Signs:  BP (!) 160/75   Pulse 62   Temp 97.6 F (36.4 C) (Oral)   Resp 14   Ht 6\' 1"  (1.854 m)   Wt 240 lb (108.9 kg)   SpO2 93%   BMI 31.66 kg/m    Physical Exam:  Physical Exam Vitals reviewed.  Constitutional:      Appearance: Normal appearance.  Skin:    Comments: Left axillary wound significantly smaller with small areas of granulation tissue, 2 x 1 cm, other previously open area healed over, palpable fullness throughout axilla and multiple scarred areas consistent with hidradenitis  Neurological:     Mental Status: He is alert.     Laboratory studies: None   Imaging:  None   Assessment:  62 y.o. yo Male who presents for follow-up status post incision and drainage of left axillary abscess and subsequent wound debridement 5/14  Plan:  -Overall patient is doing well without significant pain or drainage from these areas -Advised that he can apply antibiotic ointment to his wound with granulation tissue -Spoke with the patient's home nurse, and advised her of the recommendations for further wound care -Advised that he may need to follow-up with someone more specialized for management of his hidradenitis, and a surgical excision will likely not be the best treatment for him -Advised to present to the emergency department if he begins to have worsening pain, drainage, fever, and chills -Follow up with me in 2 to 3 weeks for reevaluation  All of the above recommendations were discussed  with the patient, and all of patient's questions were answered to his expressed satisfaction.  Theophilus Kinds, DO [facility] [address] [place], [place] [address] [phone] (office)

## 2023-04-14 NOTE — Patient Instructions (Addendum)
Wound Care Instructions: -Apply antibiotic ointment (neosporin) to the open wound in your left armpit -Apply antibiotic ointment after showering -Go back to the Emergency Department if you begin to have worsening pain, redness, fever, chills, or drainage from the area

## 2023-05-05 ENCOUNTER — Ambulatory Visit: Payer: Medicare Other | Admitting: Surgery

## 2023-05-07 ENCOUNTER — Ambulatory Visit (INDEPENDENT_AMBULATORY_CARE_PROVIDER_SITE_OTHER): Payer: Medicare Other | Admitting: Surgery

## 2023-05-07 ENCOUNTER — Encounter: Payer: Self-pay | Admitting: Surgery

## 2023-05-07 VITALS — BP 118/60 | HR 88 | Temp 98.4°F | Resp 14 | Ht 73.0 in | Wt 240.0 lb

## 2023-05-07 DIAGNOSIS — S41102D Unspecified open wound of left upper arm, subsequent encounter: Secondary | ICD-10-CM

## 2023-05-07 NOTE — Progress Notes (Signed)
[facility] Note   HPI:  62 y.o. Male presents to clinic for follow-up evaluation of left axillary incision and drainage site with subsequent wound debridement.  Patient is doing well.  He denies any pain or drainage from the area.  He is still putting antibiotic ointment on the area.  Denies fevers and chills.  Review of Systems:  All other review of systems: otherwise negative   Vital Signs:  BP 118/60   Pulse 88   Temp 98.4 F (36.9 C) (Oral)   Resp 14   Ht 6\' 1"  (1.854 m)   Wt 240 lb (108.9 kg) Comment: per pt  SpO2 94%   BMI 31.66 kg/m    Physical Exam:  Physical Exam Vitals reviewed.  Constitutional:      Appearance: Normal appearance.  Skin:    Comments: Left axilla with well-healed wound, palpable fullness throughout axilla with multiple scarred areas consistent with hidradenitis  Neurological:     Mental Status: He is alert.     Laboratory studies: None  Imaging:  None  Assessment:  62 y.o. yo Male who presents for follow-up status post incision and drainage of left axillary abscess in the emergency department and subsequent wound debridement on 5/14  Plan:  -Patient's axilla is healing very well.  Advised that he no longer needs to put anything on the previous wounds -No further interventions at this time.  If the area gets reinfected, advised that he may need to follow-up with someone more specialized for management of his hidradenitis -Information provided to the patient regarding hidradenitis -Follow up as needed  All of the above recommendations were discussed with the patient, and all of patient's questions were answered to his expressed satisfaction.  Theophilus Kinds, DO [facility] [address] [place], [place] [address] [phone] (office)

## 2023-09-03 ENCOUNTER — Ambulatory Visit (INDEPENDENT_AMBULATORY_CARE_PROVIDER_SITE_OTHER): Payer: Medicare Other

## 2023-09-03 ENCOUNTER — Encounter (INDEPENDENT_AMBULATORY_CARE_PROVIDER_SITE_OTHER): Payer: Self-pay

## 2023-09-03 VITALS — Ht 73.0 in | Wt 240.0 lb

## 2023-09-03 DIAGNOSIS — T162XXA Foreign body in left ear, initial encounter: Secondary | ICD-10-CM

## 2023-09-03 NOTE — Progress Notes (Signed)
Dear Dr. Bonnetta Barry ref. provider found, Here is my assessment for our mutual patient, [name] [name]. Thank you for allowing me the opportunity to care for your patient. Please do not hesitate to contact me should you have any other questions. Sincerely, Dr. Jovita Kussmaul  Otolaryngology Clinic Note Referring provider: Dr. Bonnetta Barry ref. provider found HPI:  [name] [name] is a 62 y.o. male kindly referred by Dr. No ref. provider found for evaluation of left ear FB. He reports that he had a left black ear bud during dialysis which did not come out of his ear. A friend tried to take it out but could not, so he wished to be seen. No ear pain, discomfort, otorrhea, dizziness, history of ear surgery or trauma. No bleeding from ear today but had some yesterday. Has not tried anything else for it.  PMHx: ESRD on Dialysis, on ASA 81, HTN, CHF, DM  H&N Surgery: denies Personal or FHx of bleeding dz or anesthesia difficulty: no   Independent Review of Additional Tests or Records:  PCP notes and Dialysis notes including medical history;  PMH/Meds/All/SocHx/FamHx/ROS:   Past Medical History:  Diagnosis Date   CHF (congestive heart failure) (HCC)    ECHO 02/05/22  EF 45-50%, DD indeterminent   Diabetes mellitus without complication (HCC)    type 2   ESRD on hemodialysis (HCC)    mon wed fri   Hypertension    Kidney failure    Myocardial infarction (HCC)    Nonischemic cardiomyopathy (HCC)      Past Surgical History:  Procedure Laterality Date   A/V FISTULAGRAM N/A 08/15/2019   Procedure: A/V FISTULAGRAM;  Surgeon: Maeola Harman, MD;  Location: [facility] INVASIVE CV LAB;  Service: Cardiovascular;  Laterality: N/A;   AMPUTATION Right 02/02/2022   Procedure: AMPUTATION 5th RAY;  Surgeon: Louann Sjogren, MD;  Location: [facility] OR;  Service: Podiatry;  Laterality: Right;   AMPUTATION Right 05/23/2022   Procedure: RIGHT BELOW KNEE AMPUTATION;  Surgeon: Nadara Mustard, MD;  Location: [facility] OR;  Service: Orthopedics;   Laterality: Right;   AMPUTATION Right 07/04/2022   Procedure: REVISION AMPUTATION BELOW KNEE;  Surgeon: Nadara Mustard, MD;  Location: [facility] OR;  Service: Orthopedics;  Laterality: Right;   APPLICATION OF WOUND VAC  05/23/2022   Procedure: APPLICATION OF WOUND VAC;  Surgeon: Nadara Mustard, MD;  Location: [facility] OR;  Service: Orthopedics;;   AV FISTULA PLACEMENT     CARDIAC CATHETERIZATION  02/17/2018   LHC 02/17/18 ([name]): 30% pLAD, 30% dRCA, LVEDP 25-30, LVEF 25%.     CATARACT EXTRACTION W/PHACO Right 03/29/2020   Procedure: CATARACT EXTRACTION PHACO AND INTRAOCULAR LENS PLACEMENT (IOC)  RIGHT DIABETIC;  Surgeon: Lockie Mola, MD;  Location: [facility] ORS;  Service: Ophthalmology;  Laterality: Right;  Lot # [id number] H [place]: 02:30.9 AP% 9.5% CDE: 14.38   EYE SURGERY Right    cataract removed   I & D EXTREMITY Right 02/02/2022   Procedure: IRRIGATION AND DEBRIDEMENT RIGHT FOOT;  Surgeon: Louann Sjogren, MD;  Location: [facility] OR;  Service: Podiatry;  Laterality: Right;   INCISION AND DRAINAGE OF WOUND Right 02/08/2022   Procedure: IRRIGATION AND DEBRIDEMENT WOUND;  Surgeon: Asencion Islam, DPM;  Location: [facility] OR;  Service: Podiatry;  Laterality: Right;  with removal of infected bone    INSERTION OF DIALYSIS CATHETER  08/13/2019   Procedure: Insertion Of Dialysis Catheter;  Surgeon: Larina Earthly, MD;  Location: [facility] OR;  Service: Vascular;;   INSERTION OF DIALYSIS CATHETER Left 07/19/2020  Procedure: INSERTION OF DIALYSIS CATHETER;  Surgeon: Maeola Harman, MD;  Location: [facility] OR;  Service: Vascular;  Laterality: Left;   IR US GUIDE BX ASP/DRAIN  11/07/2022   IRRIGATION AND DEBRIDEMENT ABSCESS     PERIPHERAL VASCULAR BALLOON ANGIOPLASTY  08/15/2019   Procedure: PERIPHERAL VASCULAR BALLOON ANGIOPLASTY;  Surgeon: Maeola Harman, MD;  Location: [facility] INVASIVE CV LAB;  Service: Cardiovascular;;   PERIPHERAL VASCULAR BALLOON ANGIOPLASTY Left 02/06/2022   Procedure: PERIPHERAL VASCULAR  BALLOON ANGIOPLASTY;  Surgeon: Cephus Shelling, MD;  Location: [facility] INVASIVE CV LAB;  Service: Cardiovascular;  Laterality: Left;   REVISON OF ARTERIOVENOUS FISTULA Left 07/19/2020   Procedure: REVISON/PLICATION OF ARTERIOVENOUS FISTULA LEFT;  Surgeon: Maeola Harman, MD;  Location: [facility] OR;  Service: Vascular;  Laterality: Left;   THROMBECTOMY AND REVISION OF ARTERIOVENTOUS (AV) GORETEX  GRAFT Left 08/13/2019   Procedure: THROMBECTOMY OF LEFT ARM ARTERIOVENTOUS (AV) FISTULA;  Surgeon: Larina Earthly, MD;  Location: [facility] OR;  Service: Vascular;  Laterality: Left;   TOE AMPUTATION      Family History  Problem Relation Age of Onset   Hypertension Mother      Social Connections: Not on file      Current Outpatient Medications:    amLODipine (NORVASC) 5 MG tablet, Take 5 mg by mouth daily., Disp: , Rfl:    aspirin EC 81 MG EC tablet, Take 1 tablet (81 mg total) by mouth daily. Swallow whole., Disp: 30 tablet, Rfl: 11   benzonatate (TESSALON) 200 MG capsule, Take 200 mg by mouth See admin instructions. Take one capsule by mouth two times a day every Tue, Thu, Sat per [facility]., Disp: , Rfl:    ethyl chloride spray, Apply 1 Application topically Every Tuesday,Thursday,and Saturday with dialysis., Disp: , Rfl:    famotidine (PEPCID) 20 MG tablet, Take 20 mg by mouth at bedtime., Disp: , Rfl:    gabapentin (NEURONTIN) 100 MG capsule, Take 100 mg by mouth at bedtime., Disp: , Rfl:    guaiFENesin (ROBITUSSIN) 100 MG/5ML liquid, Take 200 mg by mouth every 4 (four) hours as needed for cough., Disp: , Rfl:    insulin glargine (LANTUS) 100 UNIT/ML injection, Inject 0.25 mLs (25 Units total) into the skin at bedtime., Disp: 10 mL, Rfl: 0   ipratropium-albuterol (DUONEB) 0.5-2.5 (3) MG/3ML SOLN, Take 3 mLs by nebulization every 4 (four) hours as needed (for wheezing or shortness of breath)., Disp: , Rfl:    loperamide (IMODIUM) 2 MG capsule, Take 1 capsule (2 mg total) by mouth as needed for diarrhea or  loose stools. (Patient taking differently: Take 2 mg by mouth daily as needed for diarrhea or loose stools.), Disp: 30 capsule, Rfl: 0   Menthol, Topical Analgesic, (BIOFREEZE) 4 % GEL, Apply 1 Application topically every 4 (four) hours as needed (for pain)., Disp: , Rfl:    Multiple Vitamin (MULTIVITAMIN WITH MINERALS) TABS tablet, Take 1 tablet by mouth daily., Disp: , Rfl:    oxyCODONE (ROXICODONE) 5 MG immediate release tablet, Take 1 tablet (5 mg total) by mouth every 4 (four) hours as needed for severe pain., Disp: 20 tablet, Rfl: 0   pantoprazole (PROTONIX) 20 MG tablet, Take 20 mg by mouth See admin instructions. Take 20mg  by mouth on mon, wed, fri and sun., Disp: , Rfl:    senna-docusate (SENOKOT-S) 8.6-50 MG tablet, Take 1 tablet by mouth at bedtime as needed for mild constipation., Disp: , Rfl:    sevelamer (RENAGEL) 800 MG tablet, Take 2,400 mg  by mouth See admin instructions. Take 2400mg  by mouth three times daily on mon, wed, fri and sun., Disp: , Rfl:    sevelamer carbonate (RENVELA) 800 MG tablet, Take 2,400 mg by mouth See admin instructions. Take 2400mg  by mouth twice daily on Tues, Thurs, and Saturdays., Disp: , Rfl:    Simethicone 80 MG TABS, Take 160 mg by mouth every 8 (eight) hours as needed (for gas)., Disp: , Rfl:    guaifenesin (HUMIBID E) 400 MG TABS tablet, Take 400 mg by mouth in the morning, at noon, and at bedtime., Disp: , Rfl:    ondansetron (ZOFRAN) 4 MG tablet, Take 4 mg by mouth every 6 (six) hours as needed for nausea or vomiting. (Patient not taking: Reported on 09/03/2023), Disp: , Rfl:    Physical Exam:   Ht 6\' 1"  (1.854 m)   Wt 240 lb (108.9 kg)   BMI 31.66 kg/m    Salient findings:  CN II-XII intact  Bilateral EAC clear and TM intact with well pneumatized middle ear spaces. Small amount of excoriation and dried blood left EAC anterior bony part - query that is where FB was tried to be removed? - but otherwise no trauma.  Weber 512: midline Rinne 512: AC  > BC b/l  Anterior rhinoscopy: Septum relatively midline; bilateral inferior turbinates without significant hypertrophy No lesions of oral cavity/oropharynx;  No obviously palpable neck masses/lymphadenopathy/thyromegaly No respiratory distress or stridor  Procedures:  Procedure: Bilateral ear microscopy using microscope (CPT [phone]) Pre-procedure diagnosis: concern for left ear FB Post-procedure diagnosis: same Indication: Concern for left ear FB; given patient's otologic complaints and history, for improved and comprehensive examination of external ear and tympanic membrane, bilateral otologic examination using microscope was performed  Procedure: Patient was placed semi-recumbent. Both ear canals were examined using the microscope with findings above. Some cerumen removed on left and on right using suction and currette.  Left: EAC was patent. TM was intact . Middle ear was aerated. Drainage: no; no FB identified Right: EAC was patent. TM was intact . Middle ear was aerated . Drainage: no Patient tolerated the procedure well.   Impression & Plans:  Kellar Westberg is a 62 y.o. male with concern for left ear FB (black ear bud). No FB noted today - maybe came out last night? Unclear. Otherwise no infection in ear. No otologic history. Small amount of dried blood left EAC  Left ear foreign body - no sign of it today - avoid EAC manipulation including fingers or qtips    - f/u PRN   Thank you for allowing me the opportunity to care for your patient. Please do not hesitate to contact me should you have any other questions.  Sincerely, Jovita Kussmaul, MD Otolarynoglogist (ENT), [facility] Phone: [phone] Fax: [phone]  09/03/2023, 11:32 AM

## 2023-10-22 ENCOUNTER — Encounter (HOSPITAL_COMMUNITY): Payer: Self-pay | Admitting: Emergency Medicine

## 2023-10-22 ENCOUNTER — Emergency Department (HOSPITAL_COMMUNITY)
Admission: EM | Admit: 2023-10-22 | Discharge: 2023-10-23 | Disposition: A | Discharge status: Home / Self Care | Payer: Medicare Other | Attending: Emergency Medicine | Admitting: Emergency Medicine

## 2023-10-22 ENCOUNTER — Ambulatory Visit: Payer: Medicare Other | Admitting: Orthopedic Surgery

## 2023-10-22 ENCOUNTER — Other Ambulatory Visit: Payer: Self-pay

## 2023-10-22 DIAGNOSIS — I509 Heart failure, unspecified: Secondary | ICD-10-CM | POA: Insufficient documentation

## 2023-10-22 DIAGNOSIS — E1065 Type 1 diabetes mellitus with hyperglycemia: Secondary | ICD-10-CM | POA: Diagnosis not present

## 2023-10-22 DIAGNOSIS — N186 End stage renal disease: Secondary | ICD-10-CM | POA: Insufficient documentation

## 2023-10-22 DIAGNOSIS — Z7982 Long term (current) use of aspirin: Secondary | ICD-10-CM | POA: Diagnosis not present

## 2023-10-22 DIAGNOSIS — R739 Hyperglycemia, unspecified: Secondary | ICD-10-CM | POA: Diagnosis present

## 2023-10-22 DIAGNOSIS — Z992 Dependence on renal dialysis: Secondary | ICD-10-CM | POA: Diagnosis not present

## 2023-10-22 DIAGNOSIS — Z794 Long term (current) use of insulin: Secondary | ICD-10-CM | POA: Diagnosis not present

## 2023-10-22 LAB — CBC
HCT: 47.9 % (ref 39.0–52.0)
Hemoglobin: 15.6 g/dL (ref 13.0–17.0)
MCH: 29.6 pg (ref 26.0–34.0)
MCHC: 32.6 g/dL (ref 30.0–36.0)
MCV: 90.9 fL (ref 80.0–100.0)
Platelets: 202 10*3/uL (ref 150–400)
RBC: 5.27 MIL/uL (ref 4.22–5.81)
RDW: 14.6 % (ref 11.5–15.5)
WBC: 6.2 10*3/uL (ref 4.0–10.5)
nRBC: 0 % (ref 0.0–0.2)

## 2023-10-22 LAB — CBG MONITORING, ED: Glucose-Capillary: >600 mg/dL (ref 70–99)

## 2023-10-22 NOTE — ED Triage Notes (Signed)
Pt in from home via [facility] with hyperglycemia (CBG >600). Pt with hx of DM and MWF dialysis, last session on Wednesday, anuric. Denies any cp or sob. Pt states he has been out of his insulin at least one month.

## 2023-10-23 ENCOUNTER — Observation Stay (HOSPITAL_COMMUNITY)
Admission: EM | Admit: 2023-10-23 | Discharge: 2023-10-28 | Disposition: A | Discharge status: Home / Self Care | Payer: Medicare Other | Attending: Internal Medicine | Admitting: Internal Medicine

## 2023-10-23 ENCOUNTER — Other Ambulatory Visit: Payer: Self-pay

## 2023-10-23 ENCOUNTER — Encounter (HOSPITAL_COMMUNITY): Payer: Self-pay | Admitting: Emergency Medicine

## 2023-10-23 ENCOUNTER — Emergency Department (HOSPITAL_COMMUNITY): Payer: Medicare Other

## 2023-10-23 DIAGNOSIS — Z89421 Acquired absence of other right toe(s): Secondary | ICD-10-CM | POA: Insufficient documentation

## 2023-10-23 DIAGNOSIS — E1165 Type 2 diabetes mellitus with hyperglycemia: Principal | ICD-10-CM | POA: Insufficient documentation

## 2023-10-23 DIAGNOSIS — E1122 Type 2 diabetes mellitus with diabetic chronic kidney disease: Secondary | ICD-10-CM | POA: Diagnosis not present

## 2023-10-23 DIAGNOSIS — Z79899 Other long term (current) drug therapy: Secondary | ICD-10-CM | POA: Insufficient documentation

## 2023-10-23 DIAGNOSIS — N186 End stage renal disease: Secondary | ICD-10-CM

## 2023-10-23 DIAGNOSIS — M25562 Pain in left knee: Secondary | ICD-10-CM | POA: Diagnosis present

## 2023-10-23 DIAGNOSIS — Z8673 Personal history of transient ischemic attack (TIA), and cerebral infarction without residual deficits: Secondary | ICD-10-CM | POA: Diagnosis not present

## 2023-10-23 DIAGNOSIS — Z89511 Acquired absence of right leg below knee: Secondary | ICD-10-CM | POA: Insufficient documentation

## 2023-10-23 DIAGNOSIS — E1100 Type 2 diabetes mellitus with hyperosmolarity without nonketotic hyperglycemic-hyperosmolar coma (NKHHC): Secondary | ICD-10-CM | POA: Diagnosis present

## 2023-10-23 DIAGNOSIS — I10 Essential (primary) hypertension: Secondary | ICD-10-CM | POA: Diagnosis present

## 2023-10-23 DIAGNOSIS — I132 Hypertensive heart and chronic kidney disease with heart failure and with stage 5 chronic kidney disease, or end stage renal disease: Secondary | ICD-10-CM | POA: Diagnosis not present

## 2023-10-23 DIAGNOSIS — Z7982 Long term (current) use of aspirin: Secondary | ICD-10-CM | POA: Diagnosis not present

## 2023-10-23 DIAGNOSIS — Z794 Long term (current) use of insulin: Secondary | ICD-10-CM | POA: Insufficient documentation

## 2023-10-23 DIAGNOSIS — E871 Hypo-osmolality and hyponatremia: Secondary | ICD-10-CM | POA: Insufficient documentation

## 2023-10-23 DIAGNOSIS — Z992 Dependence on renal dialysis: Secondary | ICD-10-CM | POA: Insufficient documentation

## 2023-10-23 DIAGNOSIS — R7989 Other specified abnormal findings of blood chemistry: Secondary | ICD-10-CM | POA: Diagnosis not present

## 2023-10-23 DIAGNOSIS — I5042 Chronic combined systolic (congestive) and diastolic (congestive) heart failure: Secondary | ICD-10-CM | POA: Diagnosis not present

## 2023-10-23 DIAGNOSIS — I5022 Chronic systolic (congestive) heart failure: Secondary | ICD-10-CM

## 2023-10-23 DIAGNOSIS — R531 Weakness: Secondary | ICD-10-CM | POA: Insufficient documentation

## 2023-10-23 DIAGNOSIS — W19XXXA Unspecified fall, initial encounter: Principal | ICD-10-CM

## 2023-10-23 DIAGNOSIS — R739 Hyperglycemia, unspecified: Secondary | ICD-10-CM | POA: Diagnosis present

## 2023-10-23 DIAGNOSIS — I509 Heart failure, unspecified: Secondary | ICD-10-CM

## 2023-10-23 DIAGNOSIS — R9431 Abnormal electrocardiogram [ECG] [EKG]: Secondary | ICD-10-CM

## 2023-10-23 LAB — CBC WITH DIFFERENTIAL/PLATELET
Abs Immature Granulocytes: 0.01 10*3/uL (ref 0.00–0.07)
Basophils Absolute: 0.1 10*3/uL (ref 0.0–0.1)
Basophils Relative: 1 %
Eosinophils Absolute: 0.3 10*3/uL (ref 0.0–0.5)
Eosinophils Relative: 5 %
HCT: 48.6 % (ref 39.0–52.0)
Hemoglobin: 16.2 g/dL (ref 13.0–17.0)
Immature Granulocytes: 0 %
Lymphocytes Relative: 23 %
Lymphs Abs: 1.2 10*3/uL (ref 0.7–4.0)
MCH: 29.8 pg (ref 26.0–34.0)
MCHC: 33.3 g/dL (ref 30.0–36.0)
MCV: 89.5 fL (ref 80.0–100.0)
Monocytes Absolute: 0.8 10*3/uL (ref 0.1–1.0)
Monocytes Relative: 15 %
Neutro Abs: 3 10*3/uL (ref 1.7–7.7)
Neutrophils Relative %: 56 %
Platelets: 161 10*3/uL (ref 150–400)
RBC: 5.43 MIL/uL (ref 4.22–5.81)
RDW: 14.5 % (ref 11.5–15.5)
WBC: 5.4 10*3/uL (ref 4.0–10.5)
nRBC: 0 % (ref 0.0–0.2)

## 2023-10-23 LAB — COMPREHENSIVE METABOLIC PANEL
ALT: 29 U/L (ref 0–44)
ALT: 32 U/L (ref 0–44)
AST: 29 U/L (ref 15–41)
AST: 40 U/L (ref 15–41)
Albumin: 3.1 g/dL — ABNORMAL LOW (ref 3.5–5.0)
Albumin: 3.1 g/dL — ABNORMAL LOW (ref 3.5–5.0)
Alkaline Phosphatase: 141 U/L — ABNORMAL HIGH (ref 38–126)
Alkaline Phosphatase: 152 U/L — ABNORMAL HIGH (ref 38–126)
Anion gap: 14 (ref 5–15)
Anion gap: 16 — ABNORMAL HIGH (ref 5–15)
BUN: 24 mg/dL — ABNORMAL HIGH (ref 8–23)
BUN: 48 mg/dL — ABNORMAL HIGH (ref 8–23)
CO2: 22 mmol/L (ref 22–32)
CO2: 27 mmol/L (ref 22–32)
Calcium: 9.2 mg/dL (ref 8.9–10.3)
Calcium: 9.5 mg/dL (ref 8.9–10.3)
Chloride: 83 mmol/L — ABNORMAL LOW (ref 98–111)
Chloride: 87 mmol/L — ABNORMAL LOW (ref 98–111)
Creatinine, Ser: 10.3 mg/dL — ABNORMAL HIGH (ref 0.61–1.24)
Creatinine, Ser: 6.75 mg/dL — ABNORMAL HIGH (ref 0.61–1.24)
GFR, Estimated: 5 mL/min — ABNORMAL LOW (ref >60)
GFR, Estimated: 9 mL/min — ABNORMAL LOW (ref >60)
Glucose, Bld: 472 mg/dL — ABNORMAL HIGH (ref 70–99)
Glucose, Bld: 699 mg/dL (ref 70–99)
Potassium: 4.4 mmol/L (ref 3.5–5.1)
Potassium: 6 mmol/L — ABNORMAL HIGH (ref 3.5–5.1)
Sodium: 121 mmol/L — ABNORMAL LOW (ref 135–145)
Sodium: 128 mmol/L — ABNORMAL LOW (ref 135–145)
Total Bilirubin: 0.6 mg/dL (ref <1.2)
Total Bilirubin: 0.8 mg/dL (ref <1.2)
Total Protein: 7.9 g/dL (ref 6.5–8.1)
Total Protein: 8.2 g/dL — ABNORMAL HIGH (ref 6.5–8.1)

## 2023-10-23 LAB — CBG MONITORING, ED
Glucose-Capillary: 325 mg/dL — ABNORMAL HIGH (ref 70–99)
Glucose-Capillary: 481 mg/dL — ABNORMAL HIGH (ref 70–99)
Glucose-Capillary: >600 mg/dL (ref 70–99)

## 2023-10-23 MED ORDER — SODIUM CHLORIDE 0.9 % IV BOLUS
250.0000 mL | Freq: Once | INTRAVENOUS | Status: AC
  Administered 2023-10-23: 250 mL via INTRAVENOUS

## 2023-10-23 MED ORDER — INSULIN GLARGINE 100 UNIT/ML ~~LOC~~ SOLN: 25.0000 [IU] | Freq: Every day | SUBCUTANEOUS | 1 refills | Status: DC

## 2023-10-23 MED ORDER — INSULIN ASPART 100 UNIT/ML IJ SOLN: 4.0000 [IU] | Freq: Once | INTRAMUSCULAR | Status: DC

## 2023-10-23 MED ORDER — INSULIN ASPART 100 UNIT/ML IV SOLN
12.0000 [IU] | Freq: Once | INTRAVENOUS | Status: AC
  Administered 2023-10-23: 12 [IU] via INTRAVENOUS

## 2023-10-23 NOTE — ED Notes (Addendum)
Pt blood sugar was 325mg /dl at 4098.

## 2023-10-23 NOTE — ED Notes (Signed)
CRITICAL VALUE STICKER  CRITICAL VALUE: Glucose 699  RECEIVER (on-site recipient of call): Psychiatrist  DATE & TIME NOTIFIED: 12:31 AM   MESSENGER (representative from lab):  MD NOTIFIED: Delo MD  TIME OF NOTIFICATION: 12:32 AM   RESPONSE:

## 2023-10-23 NOTE — ED Provider Notes (Signed)
[place] EMERGENCY DEPARTMENT AT [facility] Provider Note   CSN: [id number] Arrival date & time: 10/22/23  2313     History  Chief Complaint  Patient presents with   Hyperglycemia    AITHEN BEIN Sr. is a 62 y.o. male.  Patient is a 62 year old male with past medical history of end-stage renal disease on hemodialysis, type 2 diabetes, GERD, or BKA of the right leg, CHF.  Patient presenting today with complaints of elevated blood sugar.  He tells me that he ran out of his insulin 1 month ago and has not had it since.  Patient tells me that his legs feel weak and is also requesting to go back to a rehab facility.  No fevers or chills.  No aggravating or alleviating factors.  The history is provided by the patient.       Home Medications Prior to Admission medications   Medication Sig Start Date End Date Taking? Authorizing Provider  amLODipine (NORVASC) 5 MG tablet Take 5 mg by mouth daily. 07/15/22   [provider]  aspirin EC 81 MG EC tablet Take 1 tablet (81 mg total) by mouth daily. Swallow whole. 02/21/22   Marguerita Merles Latif, DO  benzonatate (TESSALON) 200 MG capsule Take 200 mg by mouth See admin instructions. Take one capsule by mouth two times a day every Tue, Thu, Sat per [facility].    [provider]  ethyl chloride spray Apply 1 Application topically Every Tuesday,Thursday,and Saturday with dialysis.    [provider]  famotidine (PEPCID) 20 MG tablet Take 20 mg by mouth at bedtime.    [provider]  gabapentin (NEURONTIN) 100 MG capsule Take 100 mg by mouth at bedtime.    [provider]  guaifenesin (HUMIBID E) 400 MG TABS tablet Take 400 mg by mouth in the morning, at noon, and at bedtime.    [provider]  guaiFENesin (ROBITUSSIN) 100 MG/5ML liquid Take 200 mg by mouth every 4 (four) hours as needed for cough.    [provider]  insulin glargine (LANTUS) 100 UNIT/ML injection Inject 0.25 mLs  (25 Units total) into the skin at bedtime. 11/08/22   Hughie Closs, MD  ipratropium-albuterol (DUONEB) 0.5-2.5 (3) MG/3ML SOLN Take 3 mLs by nebulization every 4 (four) hours as needed (for wheezing or shortness of breath).    [provider]  loperamide (IMODIUM) 2 MG capsule Take 1 capsule (2 mg total) by mouth as needed for diarrhea or loose stools. Patient taking differently: Take 2 mg by mouth daily as needed for diarrhea or loose stools. 07/08/22   Rodolph Bong, MD  Menthol, Topical Analgesic, (BIOFREEZE) 4 % GEL Apply 1 Application topically every 4 (four) hours as needed (for pain).    [provider]  Multiple Vitamin (MULTIVITAMIN WITH MINERALS) TABS tablet Take 1 tablet by mouth daily.    [provider]  ondansetron (ZOFRAN) 4 MG tablet Take 4 mg by mouth every 6 (six) hours as needed for nausea or vomiting. Patient not taking: Reported on 09/03/2023 06/30/22   [provider]  oxyCODONE (ROXICODONE) 5 MG immediate release tablet Take 1 tablet (5 mg total) by mouth every 4 (four) hours as needed for severe pain. 03/17/23   Darrick Grinder, PA-C  pantoprazole (PROTONIX) 20 MG tablet Take 20 mg by mouth See admin instructions. Take 20mg  by mouth on mon, wed, fri and sun.    [provider]  senna-docusate (SENOKOT-S) 8.6-50 MG tablet  Take 1 tablet by mouth at bedtime as needed for mild constipation. 07/08/22   Rodolph Bong, MD  sevelamer (RENAGEL) 800 MG tablet Take 2,400 mg by mouth See admin instructions. Take 2400mg  by mouth three times daily on mon, wed, fri and sun.    [provider]  sevelamer carbonate (RENVELA) 800 MG tablet Take 2,400 mg by mouth See admin instructions. Take 2400mg  by mouth twice daily on Tues, Thurs, and Saturdays.    [provider]  Simethicone 80 MG TABS Take 160 mg by mouth every 8 (eight) hours as needed (for gas).    [provider]      Allergies    Sulfa antibiotics     Review of Systems   Review of Systems  All other systems reviewed and are negative.   Physical Exam Updated Vital Signs BP 126/79   Pulse 81   Temp 98.2 F (36.8 C) (Oral)   Resp 15   Wt 108.9 kg   SpO2 98%   BMI 31.66 kg/m  Physical Exam Vitals and nursing note reviewed.  Constitutional:      General: He is not in acute distress.    Appearance: He is well-developed. He is not diaphoretic.     Comments: Patient is a chronically ill-appearing 62 year old male in no acute distress.  HENT:     Head: Normocephalic and atraumatic.  Cardiovascular:     Rate and Rhythm: Normal rate and regular rhythm.     Heart sounds: No murmur heard.    No friction rub.  Pulmonary:     Effort: Pulmonary effort is normal. No respiratory distress.     Breath sounds: Normal breath sounds. No wheezing or rales.  Abdominal:     General: Bowel sounds are normal. There is no distension.     Palpations: Abdomen is soft.     Tenderness: There is no abdominal tenderness.  Musculoskeletal:        General: Normal range of motion.     Cervical back: Normal range of motion and neck supple.  Skin:    General: Skin is warm and dry.  Neurological:     Mental Status: He is alert and oriented to person, place, and time.     Coordination: Coordination normal.     ED Results / Procedures / Treatments   Labs (all labs ordered are listed, but only abnormal results are displayed) Labs Reviewed  COMPREHENSIVE METABOLIC PANEL - Abnormal; Notable for the following components:      Result Value   Sodium 121 (*)    Potassium 6.0 (*)    Chloride 83 (*)    Glucose, Bld 699 (*)    BUN 48 (*)    Creatinine, Ser 10.30 (*)    Albumin 3.1 (*)    Alkaline Phosphatase 141 (*)    GFR, Estimated 5 (*)    Anion gap 16 (*)    All other components within normal limits  CBG MONITORING, ED - Abnormal; Notable for the following components:   Glucose-Capillary >600 (*)    All other components within normal limits   CBC  URINALYSIS, ROUTINE W REFLEX MICROSCOPIC  CBG MONITORING, ED    EKG None  Radiology No results found.  Procedures Procedures    Medications Ordered in ED Medications  sodium chloride 0.9 % bolus 250 mL (has no administration in time range)  insulin aspart (novoLOG) injection 12 Units (has no administration in time range)    ED Course/ Medical Decision Making/  A&P  Patient is a 62 year old male with extensive past medical history including type 1 diabetes, end-stage renal disease on hemodialysis.  Patient presenting with complaints of elevated blood sugar and weakness.  He tells me that he ran out of his diabetic medications 1 month ago and has not been taking them.  Patient arrives with stable vital signs and is afebrile.    Laboratory studies reveal a blood sugar of 699, but electrolytes that are not reflective of ketoacidosis.  He does have a potassium of 6, but with a hemolyzed specimen.  Patient has received IV fluids along with an IV dose of NovoLog.  His sugars have trended downward and are currently 325.  In the absence of DKA, I feel as though patient can be discharged.  I will represcribe his diabetic medications and have him follow-up with his primary doctor.  Final Clinical Impression(s) / ED Diagnoses Final diagnoses:  None    Rx / DC Orders ED Discharge Orders     None         Geoffery Lyons, MD 10/23/23 [id number]

## 2023-10-23 NOTE — ED Notes (Signed)
CBG HI RN notified.

## 2023-10-23 NOTE — Discharge Instructions (Signed)
Resume taking your Lantus as previously prescribed.  This medication has been refilled for you here in the ER tonight.  Follow-up with your primary doctor to discuss your blood sugars and diabetic regimen.

## 2023-10-23 NOTE — ED Triage Notes (Signed)
Pt is here with reports of fall yesterday.  Left hip and upper leg/knee pain.  Reports he has been increasingly weak and unable to do ADLs without assistance.  Had dialysis today and was recommended he go back to [facility].

## 2023-10-23 NOTE — ED Provider Triage Note (Signed)
Emergency Medicine Provider Triage Evaluation Note  [name] Lance Sr. , a 62 y.o. male  was evaluated in triage.  Pt complains of pain to L hip, thigh, and knee after a mechanical fall yesterday. States he tripped and fell, did not hit his head. Denies any Chest pain, shortness of breath, or dizziness before the fall. Reports blood sugar was in the 600s yesterday and has been out of his insulin  Review of Systems  Positive: As above Negative: As above  Physical Exam  BP (!) 103/57 (BP Location: Right Arm)   Pulse 85   Temp 98.3 F (36.8 C) (Oral)   Resp 18   SpO2 97%  Gen:   Awake, no distress   Resp:  Normal effort  MSK:   Moves extremities without difficulty  Other:  Mild TTP along left greater trochanter   Medical Decision Making  Medically screening exam initiated at 5:38 PM.  Appropriate orders placed.  [name] Lance Sr. was informed that the remainder of the evaluation will be completed by another provider, this initial triage assessment does not replace that evaluation, and the importance of remaining in the ED until their evaluation is complete.     Arabella Merles, PA-C 10/23/23 1740

## 2023-10-23 NOTE — ED Notes (Signed)
Patient verbalizes understanding of discharge instructions. Opportunity for questioning and answers were provided. Armband removed by staff, pt discharged from ED. Wheeled out to lobby, awaiting ride home

## 2023-10-24 ENCOUNTER — Observation Stay (HOSPITAL_COMMUNITY): Payer: Medicare Other

## 2023-10-24 ENCOUNTER — Encounter (HOSPITAL_COMMUNITY): Payer: Self-pay | Admitting: Internal Medicine

## 2023-10-24 ENCOUNTER — Emergency Department (HOSPITAL_COMMUNITY): Payer: Medicare Other

## 2023-10-24 DIAGNOSIS — R739 Hyperglycemia, unspecified: Secondary | ICD-10-CM | POA: Diagnosis present

## 2023-10-24 DIAGNOSIS — E1100 Type 2 diabetes mellitus with hyperosmolarity without nonketotic hyperglycemic-hyperosmolar coma (NKHHC): Secondary | ICD-10-CM | POA: Diagnosis not present

## 2023-10-24 DIAGNOSIS — E1165 Type 2 diabetes mellitus with hyperglycemia: Secondary | ICD-10-CM | POA: Diagnosis not present

## 2023-10-24 DIAGNOSIS — R9431 Abnormal electrocardiogram [ECG] [EKG]: Secondary | ICD-10-CM

## 2023-10-24 LAB — BASIC METABOLIC PANEL
Anion gap: 14 (ref 5–15)
Anion gap: 15 (ref 5–15)
Anion gap: 12 (ref 5–15)
BUN: 34 mg/dL — ABNORMAL HIGH (ref 8–23)
BUN: 35 mg/dL — ABNORMAL HIGH (ref 8–23)
BUN: 39 mg/dL — ABNORMAL HIGH (ref 8–23)
CO2: 25 mmol/L (ref 22–32)
CO2: 25 mmol/L (ref 22–32)
CO2: 26 mmol/L (ref 22–32)
Calcium: 9.5 mg/dL (ref 8.9–10.3)
Calcium: 9.3 mg/dL (ref 8.9–10.3)
Calcium: 10.1 mg/dL (ref 8.9–10.3)
Chloride: 85 mmol/L — ABNORMAL LOW (ref 98–111)
Chloride: 92 mmol/L — ABNORMAL LOW (ref 98–111)
Chloride: 89 mmol/L — ABNORMAL LOW (ref 98–111)
Creatinine, Ser: 7.87 mg/dL — ABNORMAL HIGH (ref 0.61–1.24)
Creatinine, Ser: 7.95 mg/dL — ABNORMAL HIGH (ref 0.61–1.24)
Creatinine, Ser: 7.99 mg/dL — ABNORMAL HIGH (ref 0.61–1.24)
GFR, Estimated: 7 mL/min — ABNORMAL LOW (ref >60)
GFR, Estimated: 7 mL/min — ABNORMAL LOW (ref >60)
GFR, Estimated: 7 mL/min — ABNORMAL LOW (ref >60)
Glucose, Bld: 685 mg/dL (ref 70–99)
Glucose, Bld: 191 mg/dL — ABNORMAL HIGH (ref 70–99)
Glucose, Bld: 202 mg/dL — ABNORMAL HIGH (ref 70–99)
Potassium: 4.8 mmol/L (ref 3.5–5.1)
Potassium: 4.1 mmol/L (ref 3.5–5.1)
Potassium: 4.9 mmol/L (ref 3.5–5.1)
Sodium: 124 mmol/L — ABNORMAL LOW (ref 135–145)
Sodium: 132 mmol/L — ABNORMAL LOW (ref 135–145)
Sodium: 127 mmol/L — ABNORMAL LOW (ref 135–145)

## 2023-10-24 LAB — I-STAT VENOUS BLOOD GAS, ED
Acid-Base Excess: 5 mmol/L — ABNORMAL HIGH (ref 0.0–2.0)
Bicarbonate: 28.9 mmol/L — ABNORMAL HIGH (ref 20.0–28.0)
Calcium, Ion: 1.18 mmol/L (ref 1.15–1.40)
HCT: 46 % (ref 39.0–52.0)
Hemoglobin: 15.6 g/dL (ref 13.0–17.0)
O2 Saturation: 99 %
Potassium: 4.2 mmol/L (ref 3.5–5.1)
Sodium: 131 mmol/L — ABNORMAL LOW (ref 135–145)
TCO2: 30 mmol/L (ref 22–32)
pCO2, Ven: 41 mm[Hg] — ABNORMAL LOW (ref 44–60)
pH, Ven: 7.457 — ABNORMAL HIGH (ref 7.25–7.43)
pO2, Ven: 129 mm[Hg] — ABNORMAL HIGH (ref 32–45)

## 2023-10-24 LAB — I-STAT CHEM 8, ED
BUN: 47 mg/dL — ABNORMAL HIGH (ref 8–23)
Calcium, Ion: 1.1 mmol/L — ABNORMAL LOW (ref 1.15–1.40)
Chloride: 89 mmol/L — ABNORMAL LOW (ref 98–111)
Creatinine, Ser: 8.1 mg/dL — ABNORMAL HIGH (ref 0.61–1.24)
Glucose, Bld: 682 mg/dL (ref 70–99)
HCT: 52 % (ref 39.0–52.0)
Hemoglobin: 17.7 g/dL — ABNORMAL HIGH (ref 13.0–17.0)
Potassium: 5.3 mmol/L — ABNORMAL HIGH (ref 3.5–5.1)
Sodium: 126 mmol/L — ABNORMAL LOW (ref 135–145)
TCO2: 29 mmol/L (ref 22–32)

## 2023-10-24 LAB — CBG MONITORING, ED
Glucose-Capillary: >600 mg/dL (ref 70–99)
Glucose-Capillary: >600 mg/dL (ref 70–99)
Glucose-Capillary: 469 mg/dL — ABNORMAL HIGH (ref 70–99)
Glucose-Capillary: 365 mg/dL — ABNORMAL HIGH (ref 70–99)
Glucose-Capillary: 282 mg/dL — ABNORMAL HIGH (ref 70–99)
Glucose-Capillary: 161 mg/dL — ABNORMAL HIGH (ref 70–99)
Glucose-Capillary: 161 mg/dL — ABNORMAL HIGH (ref 70–99)
Glucose-Capillary: 189 mg/dL — ABNORMAL HIGH (ref 70–99)
Glucose-Capillary: 153 mg/dL — ABNORMAL HIGH (ref 70–99)
Glucose-Capillary: 152 mg/dL — ABNORMAL HIGH (ref 70–99)
Glucose-Capillary: 197 mg/dL — ABNORMAL HIGH (ref 70–99)

## 2023-10-24 LAB — OSMOLALITY
Osmolality: 329 mosm/kg (ref 275–295)
Osmolality: 294 mosm/kg (ref 275–295)

## 2023-10-24 LAB — GLUCOSE, CAPILLARY
Glucose-Capillary: 207 mg/dL — ABNORMAL HIGH (ref 70–99)
Glucose-Capillary: 330 mg/dL — ABNORMAL HIGH (ref 70–99)

## 2023-10-24 LAB — BETA-HYDROXYBUTYRIC ACID: Beta-Hydroxybutyric Acid: 0.19 mmol/L (ref 0.05–0.27)

## 2023-10-24 LAB — TROPONIN I (HIGH SENSITIVITY)
Troponin I (High Sensitivity): 84 ng/L — ABNORMAL HIGH (ref <18)
Troponin I (High Sensitivity): 243 ng/L (ref <18)
Troponin I (High Sensitivity): 558 ng/L (ref <18)

## 2023-10-24 LAB — MAGNESIUM: Magnesium: 1.9 mg/dL (ref 1.7–2.4)

## 2023-10-24 MED ORDER — DEXTROSE IN LACTATED RINGERS 5 % IV SOLN
INTRAVENOUS | Status: DC
Start: 2023-10-24 — End: 2023-10-24

## 2023-10-24 MED ORDER — ACETAMINOPHEN 325 MG PO TABS: 650.0000 mg | ORAL_TABLET | Freq: Four times a day (QID) | ORAL | Status: DC | PRN

## 2023-10-24 MED ORDER — ACETAMINOPHEN 650 MG RE SUPP: 650.0000 mg | Freq: Four times a day (QID) | RECTAL | Status: DC | PRN

## 2023-10-24 MED ORDER — DEXTROSE 50 % IV SOLN: 0.0000 mL | INTRAVENOUS | Status: DC | PRN

## 2023-10-24 MED ORDER — CINACALCET HCL 30 MG PO TABS
30.0000 mg | ORAL_TABLET | Freq: Every day | ORAL | Status: DC
  Administered 2023-10-24 – 2023-10-28 (×5): 30 mg via ORAL
  Filled 2023-10-24 (×4): qty 1

## 2023-10-24 MED ORDER — SEVELAMER CARBONATE 800 MG PO TABS: 2400.0000 mg | ORAL_TABLET | Freq: Two times a day (BID) | ORAL | Status: DC | PRN

## 2023-10-24 MED ORDER — INSULIN GLARGINE-YFGN 100 UNIT/ML ~~LOC~~ SOLN
25.0000 [IU] | Freq: Every day | SUBCUTANEOUS | Status: DC
  Administered 2023-10-24 – 2023-10-26 (×3): 25 [IU] via SUBCUTANEOUS
  Filled 2023-10-24 (×3): qty 0.25

## 2023-10-24 MED ORDER — SEVELAMER CARBONATE 800 MG PO TABS: 2400.0000 mg | ORAL_TABLET | ORAL | Status: DC

## 2023-10-24 MED ORDER — LACTATED RINGERS IV BOLUS
20.0000 mL/kg | Freq: Once | INTRAVENOUS | Status: AC
Start: 2023-10-24 — End: 2023-10-24
  Administered 2023-10-24: 2178 mL via INTRAVENOUS

## 2023-10-24 MED ORDER — DEXTROSE IN LACTATED RINGERS 5 % IV SOLN: INTRAVENOUS | Status: DC

## 2023-10-24 MED ORDER — INSULIN ASPART 100 UNIT/ML IJ SOLN
0.0000 [IU] | Freq: Three times a day (TID) | INTRAMUSCULAR | Status: DC
  Administered 2023-10-24: 2 [IU] via SUBCUTANEOUS
  Administered 2023-10-25: 3 [IU] via SUBCUTANEOUS
  Administered 2023-10-25 (×2): 7 [IU] via SUBCUTANEOUS
  Administered 2023-10-26 (×2): 5 [IU] via SUBCUTANEOUS
  Administered 2023-10-27: 3 [IU] via SUBCUTANEOUS
  Administered 2023-10-27: 5 [IU] via SUBCUTANEOUS
  Administered 2023-10-27: 2 [IU] via SUBCUTANEOUS
  Administered 2023-10-28: 1 [IU] via SUBCUTANEOUS

## 2023-10-24 MED ORDER — INSULIN REGULAR(HUMAN) IN NACL 100-0.9 UT/100ML-% IV SOLN
INTRAVENOUS | Status: DC
  Administered 2023-10-24: 11.5 [IU]/h via INTRAVENOUS
  Filled 2023-10-24: qty 100

## 2023-10-24 MED ORDER — INSULIN ASPART 100 UNIT/ML IJ SOLN
8.0000 [IU] | Freq: Once | INTRAMUSCULAR | Status: AC
  Administered 2023-10-24: 8 [IU] via SUBCUTANEOUS

## 2023-10-24 MED ORDER — SODIUM CHLORIDE 0.9% FLUSH
10.0000 mL | Freq: Two times a day (BID) | INTRAVENOUS | Status: DC
  Administered 2023-10-24 – 2023-10-27 (×7): 10 mL via INTRAVENOUS

## 2023-10-24 MED ORDER — INSULIN ASPART 100 UNIT/ML IJ SOLN
0.0000 [IU] | Freq: Every day | INTRAMUSCULAR | Status: DC
  Administered 2023-10-24: 4 [IU] via SUBCUTANEOUS
  Administered 2023-10-25 – 2023-10-26 (×2): 3 [IU] via SUBCUTANEOUS

## 2023-10-24 MED ORDER — SEVELAMER CARBONATE 800 MG PO TABS
4000.0000 mg | ORAL_TABLET | Freq: Three times a day (TID) | ORAL | Status: DC
  Administered 2023-10-25 – 2023-10-28 (×8): 4000 mg via ORAL
  Filled 2023-10-24 (×10): qty 5

## 2023-10-24 NOTE — ED Provider Notes (Signed)
[place] EMERGENCY DEPARTMENT AT [facility] Provider Note   CSN: [id number] Arrival date & time: 10/23/23  1716     History  Chief Complaint  Patient presents with  . Fall    [name] TORBECK Sr. is a 62 y.o. male.  The history is provided by the patient.  Fall This is a new problem. The current episode started 6 to 12 hours ago. The problem occurs constantly. The problem has been resolved. Pertinent negatives include no chest pain, no abdominal pain, no headaches and no shortness of breath. Associated symptoms comments: Global weakness, hyper glycemia . Nothing aggravates the symptoms. Nothing relieves the symptoms. The treatment provided no relief.  Patient with CHF, ESRD, DM presents with fall and hyperglycemia.  Patient reports feeling globally weak with sugars being elevated.      Past Medical History:  Diagnosis Date  . CHF (congestive heart failure) (HCC)    ECHO 02/05/22  EF 45-50%, DD indeterminent  . Diabetes mellitus without complication (HCC)    type 2  . ESRD on hemodialysis (HCC)    mon wed fri  . Hypertension   . Kidney failure   . Myocardial infarction (HCC)   . Nonischemic cardiomyopathy (HCC)      Home Medications Prior to Admission medications   Medication Sig Start Date End Date Taking? Authorizing Provider  amLODipine (NORVASC) 5 MG tablet Take 5 mg by mouth daily. 07/15/22   [provider]  aspirin EC 81 MG EC tablet Take 1 tablet (81 mg total) by mouth daily. Swallow whole. 02/21/22   Marguerita Merles Latif, DO  benzonatate (TESSALON) 200 MG capsule Take 200 mg by mouth See admin instructions. Take one capsule by mouth two times a day every Tue, Thu, Sat per [facility]    [provider]  ethyl chloride spray Apply 1 Application topically Every Tuesday,Thursday,and Saturday with dialysis.    [provider]  famotidine (PEPCID) 20 MG tablet Take 20 mg by mouth at bedtime.    [provider]  gabapentin (NEURONTIN)  100 MG capsule Take 100 mg by mouth at bedtime.    [provider]  guaifenesin (HUMIBID E) 400 MG TABS tablet Take 400 mg by mouth in the morning, at noon, and at bedtime.    [provider]  guaiFENesin (ROBITUSSIN) 100 MG/5ML liquid Take 200 mg by mouth every 4 (four) hours as needed for cough.    [provider]  insulin glargine (LANTUS) 100 UNIT/ML injection Inject 0.25 mLs (25 Units total) into the skin at bedtime. 10/23/23   Geoffery Lyons, MD  ipratropium-albuterol (DUONEB) 0.5-2.5 (3) MG/3ML SOLN Take 3 mLs by nebulization every 4 (four) hours as needed (for wheezing or shortness of breath).    [provider]  loperamide (IMODIUM) 2 MG capsule Take 1 capsule (2 mg total) by mouth as needed for diarrhea or loose stools. Patient taking differently: Take 2 mg by mouth daily as needed for diarrhea or loose stools. 07/08/22   Rodolph Bong, MD  Menthol, Topical Analgesic, (BIOFREEZE) 4 % GEL Apply 1 Application topically every 4 (four) hours as needed (for pain).    [provider]  Multiple Vitamin (MULTIVITAMIN WITH MINERALS) TABS tablet Take 1 tablet by mouth daily.    [provider]  ondansetron (ZOFRAN) 4 MG tablet Take 4 mg by mouth every 6 (six) hours as needed for nausea or vomiting. Patient not taking: Reported on 09/03/2023 06/30/22   [provider]  oxyCODONE (  ROXICODONE) 5 MG immediate release tablet Take 1 tablet (5 mg total) by mouth every 4 (four) hours as needed for severe pain. 03/17/23   Darrick Grinder, PA-C  pantoprazole (PROTONIX) 20 MG tablet Take 20 mg by mouth See admin instructions. Take 20mg  by mouth on mon, wed, fri and sun.    [provider]  senna-docusate (SENOKOT-S) 8.6-50 MG tablet Take 1 tablet by mouth at bedtime as needed for mild constipation. 07/08/22   Rodolph Bong, MD  sevelamer (RENAGEL) 800 MG tablet Take 2,400 mg by mouth See admin instructions. Take 2400mg  by mouth three  times daily on mon, wed, fri and sun.    [provider]  sevelamer carbonate (RENVELA) 800 MG tablet Take 2,400 mg by mouth See admin instructions. Take 2400mg  by mouth twice daily on Tues, Thurs, and Saturdays.    [provider]  Simethicone 80 MG TABS Take 160 mg by mouth every 8 (eight) hours as needed (for gas).    [provider]      Allergies    Sulfa antibiotics    Review of Systems   Review of Systems  Constitutional:  Positive for fatigue.  HENT:  Negative for facial swelling.   Respiratory:  Negative for shortness of breath.   Cardiovascular:  Negative for chest pain.  Gastrointestinal:  Negative for abdominal pain and vomiting.  Skin:  Negative for rash.  Neurological:  Negative for facial asymmetry, speech difficulty, numbness and headaches.    Physical Exam Updated Vital Signs BP (!) 144/85   Pulse 96   Temp 98.3 F (36.8 C) (Oral)   Resp 20   SpO2 99%  Physical Exam Vitals and nursing note reviewed.  Constitutional:      General: He is not in acute distress.    Appearance: Normal appearance. He is well-developed. He is not diaphoretic.  HENT:     Head: Normocephalic and atraumatic.     Nose: Nose normal.  Eyes:     Conjunctiva/sclera: Conjunctivae normal.     Pupils: Pupils are equal, round, and reactive to light.  Cardiovascular:     Rate and Rhythm: Normal rate and regular rhythm.     Pulses: Normal pulses.     Heart sounds: Normal heart sounds.  Pulmonary:     Effort: Pulmonary effort is normal.     Breath sounds: Normal breath sounds. No wheezing or rales.  Abdominal:     General: Bowel sounds are normal.     Palpations: Abdomen is soft.     Tenderness: There is no abdominal tenderness. There is no guarding or rebound.  Musculoskeletal:        General: Normal range of motion.     Cervical back: Normal range of motion and neck supple.  Skin:    General: Skin is warm and dry.     Capillary Refill: Capillary refill  takes less than 2 seconds.  Neurological:     General: No focal deficit present.     Mental Status: He is alert and oriented to person, place, and time.     Deep Tendon Reflexes: Reflexes normal.  Psychiatric:        Mood and Affect: Mood normal.        Behavior: Behavior normal.    ED Results / Procedures / Treatments   Labs (all labs ordered are listed, but only abnormal results are displayed) Results for orders placed or performed during the hospital encounter of 10/23/23  CBC with Differential  Result Value Ref Range   WBC 5.4 4.0 - 10.5 K/uL   RBC 5.43 4.22 - 5.81 MIL/uL   Hemoglobin 16.2 13.0 - 17.0 g/dL   HCT 16.1 09.6 - 04.5 %   MCV 89.5 80.0 - 100.0 fL   MCH 29.8 26.0 - 34.0 pg   MCHC 33.3 30.0 - 36.0 g/dL   RDW 40.9 81.1 - 91.4 %   Platelets 161 150 - 400 K/uL   nRBC 0.0 0.0 - 0.2 %   Neutrophils Relative % 56 %   Neutro Abs 3.0 1.7 - 7.7 K/uL   Lymphocytes Relative 23 %   Lymphs Abs 1.2 0.7 - 4.0 K/uL   Monocytes Relative 15 %   Monocytes Absolute 0.8 0.1 - 1.0 K/uL   Eosinophils Relative 5 %   Eosinophils Absolute 0.3 0.0 - 0.5 K/uL   Basophils Relative 1 %   Basophils Absolute 0.1 0.0 - 0.1 K/uL   Immature Granulocytes 0 %   Abs Immature Granulocytes 0.01 0.00 - 0.07 K/uL  Comprehensive metabolic panel  Result Value Ref Range   Sodium 128 (L) 135 - 145 mmol/L   Potassium 4.4 3.5 - 5.1 mmol/L   Chloride 87 (L) 98 - 111 mmol/L   CO2 27 22 - 32 mmol/L   Glucose, Bld 472 (H) 70 - 99 mg/dL   BUN 24 (H) 8 - 23 mg/dL   Creatinine, Ser 7.82 (H) 0.61 - 1.24 mg/dL   Calcium 9.2 8.9 - 95.6 mg/dL   Total Protein 8.2 (H) 6.5 - 8.1 g/dL   Albumin 3.1 (L) 3.5 - 5.0 g/dL   AST 29 15 - 41 U/L   ALT 29 0 - 44 U/L   Alkaline Phosphatase 152 (H) 38 - 126 U/L   Total Bilirubin 0.6 <1.2 mg/dL   GFR, Estimated 9 (L) >60 mL/min   Anion gap 14 5 - 15  POC CBG, ED  Result Value Ref Range   Glucose-Capillary >600 (HH) 70 - 99 mg/dL  CBG monitoring, ED  Result Value Ref  Range   Glucose-Capillary >600 (HH) 70 - 99 mg/dL  I-stat chem 8, ED (not at [facility], [facility] or [facility])  Result Value Ref Range   Sodium 126 (L) 135 - 145 mmol/L   Potassium 5.3 (H) 3.5 - 5.1 mmol/L   Chloride 89 (L) 98 - 111 mmol/L   BUN 47 (H) 8 - 23 mg/dL   Creatinine, Ser 2.13 (H) 0.61 - 1.24 mg/dL   Glucose, Bld 086 (HH) 70 - 99 mg/dL   Calcium, Ion 5.78 (L) 1.15 - 1.40 mmol/L   TCO2 29 22 - 32 mmol/L   Hemoglobin 17.7 (H) 13.0 - 17.0 g/dL   HCT 46.9 62.9 - 52.8 %   Comment NOTIFIED PHYSICIAN   CBG monitoring, ED  Result Value Ref Range   Glucose-Capillary >600 (HH) 70 - 99 mg/dL   CT Renal Stone Study  Result Date: 10/24/2023 CLINICAL DATA:  Abdominal pain EXAM: CT ABDOMEN AND PELVIS WITHOUT CONTRAST TECHNIQUE: Multidetector CT imaging of the abdomen and pelvis was performed following the standard protocol without IV contrast. RADIATION DOSE REDUCTION: This exam was performed according to the departmental dose-optimization program which includes automated exposure control, adjustment of the mA and/or kV according to patient size and/or use of iterative reconstruction technique. COMPARISON:  MRI abdomen dated 10/30/2022 FINDINGS: Lower chest: Mild left basilar scarring/atelectasis. Hepatobiliary: Unenhanced liver is grossly unremarkable, noting a mildly nodular hepatic contour. Cholelithiasis, without associated inflammatory changes. No intrahepatic or extrahepatic  duct dilatation. Pancreas: Within normal limits. Spleen: Within normal limits. Adrenals/Urinary Tract: Adrenal glands are within normal limits. Bilateral renal cortical scarring/atrophy. Bilateral renal cysts, measuring up to 18 mm in the posterior right lower kidney (series 3/image 37), benign (Bosniak I). No follow-up is recommended. Bilateral renal vascular calcifications. No renal calculi or hydronephrosis. Bladder is underdistended but unremarkable. Stomach/Bowel: Stomach is within normal limits. No evidence of bowel obstruction.  Normal appendix (series 3/image 62). No colonic wall thickening or inflammatory changes. Vascular/Lymphatic: No evidence of abdominal aortic aneurysm. Extensive atherosclerotic calcifications of the abdominal aorta and branch vessels. No suspicious abdominopelvic lymphadenopathy. Reproductive: Prostate is unremarkable. Other: No abdominopelvic ascites. Musculoskeletal: Mild degenerative changes at L4-5. IMPRESSION: No CT findings to account for the patient's abdominal pain. Cholelithiasis, without associated inflammatory changes. Additional ancillary findings as above. Electronically Signed   By: Charline Bills M.D.   On: 10/24/2023 01:10   DG Pelvis 1-2 Views  Result Date: 10/23/2023 CLINICAL DATA:  Fall with left hip pain EXAM: PELVIS - 1-2 VIEW COMPARISON:  CT 08/05/2019 FINDINGS: SI joints are non widened. Extensive vascular calcifications. No fracture or malalignment. Pubic symphysis and rami appear intact. Mild bilateral hip degenerative change IMPRESSION: No acute osseous abnormality. Electronically Signed   By: Jasmine Pang M.D.   On: 10/23/2023 19:17   DG Femur Min 2 Views Left  Result Date: 10/23/2023 CLINICAL DATA:  Fall with left hip pain EXAM: LEFT FEMUR 2 VIEWS COMPARISON:  None Available. FINDINGS: There is no evidence of fracture or other focal bone lesions. Soft tissues are unremarkable. Extensive vascular calcifications. IMPRESSION: Negative. Electronically Signed   By: Jasmine Pang M.D.   On: 10/23/2023 19:16    EKG EKG Interpretation Date/Time:  Friday October 23 2023 18:03:32 EST Ventricular Rate:  83 PR Interval:  150 QRS Duration:  148 QT Interval:  448 QTC Calculation: 526 R Axis:   223  Text Interpretation: Normal sinus rhythm Left ventricular hypertrophy with QRS widening and repolarization abnormality ( Cornell product ) Lateral infarct , age undetermined Confirmed by Nicanor Alcon, Kaylen Nghiem ([id number]) on 10/23/2023 11:03:24 PM  Radiology CT Renal Stone Study  Result  Date: 10/24/2023 CLINICAL DATA:  Abdominal pain EXAM: CT ABDOMEN AND PELVIS WITHOUT CONTRAST TECHNIQUE: Multidetector CT imaging of the abdomen and pelvis was performed following the standard protocol without IV contrast. RADIATION DOSE REDUCTION: This exam was performed according to the departmental dose-optimization program which includes automated exposure control, adjustment of the mA and/or kV according to patient size and/or use of iterative reconstruction technique. COMPARISON:  MRI abdomen dated 10/30/2022 FINDINGS: Lower chest: Mild left basilar scarring/atelectasis. Hepatobiliary: Unenhanced liver is grossly unremarkable, noting a mildly nodular hepatic contour. Cholelithiasis, without associated inflammatory changes. No intrahepatic or extrahepatic duct dilatation. Pancreas: Within normal limits. Spleen: Within normal limits. Adrenals/Urinary Tract: Adrenal glands are within normal limits. Bilateral renal cortical scarring/atrophy. Bilateral renal cysts, measuring up to 18 mm in the posterior right lower kidney (series 3/image 37), benign (Bosniak I). No follow-up is recommended. Bilateral renal vascular calcifications. No renal calculi or hydronephrosis. Bladder is underdistended but unremarkable. Stomach/Bowel: Stomach is within normal limits. No evidence of bowel obstruction. Normal appendix (series 3/image 62). No colonic wall thickening or inflammatory changes. Vascular/Lymphatic: No evidence of abdominal aortic aneurysm. Extensive atherosclerotic calcifications of the abdominal aorta and branch vessels. No suspicious abdominopelvic lymphadenopathy. Reproductive: Prostate is unremarkable. Other: No abdominopelvic ascites. Musculoskeletal: Mild degenerative changes at L4-5. IMPRESSION: No CT findings to account for the patient's abdominal pain. Cholelithiasis, without associated  inflammatory changes. Additional ancillary findings as above. Electronically Signed   By: Charline Bills M.D.   On:  10/24/2023 01:10   DG Pelvis 1-2 Views  Result Date: 10/23/2023 CLINICAL DATA:  Fall with left hip pain EXAM: PELVIS - 1-2 VIEW COMPARISON:  CT 08/05/2019 FINDINGS: SI joints are non widened. Extensive vascular calcifications. No fracture or malalignment. Pubic symphysis and rami appear intact. Mild bilateral hip degenerative change IMPRESSION: No acute osseous abnormality. Electronically Signed   By: Jasmine Pang M.D.   On: 10/23/2023 19:17   DG Femur Min 2 Views Left  Result Date: 10/23/2023 CLINICAL DATA:  Fall with left hip pain EXAM: LEFT FEMUR 2 VIEWS COMPARISON:  None Available. FINDINGS: There is no evidence of fracture or other focal bone lesions. Soft tissues are unremarkable. Extensive vascular calcifications. IMPRESSION: Negative. Electronically Signed   By: Jasmine Pang M.D.   On: 10/23/2023 19:16    Procedures .Critical Care  Performed by: Cy Blamer, MD Authorized by: Cy Blamer, MD   Critical care provider statement:    Critical care time (minutes):  60   Critical care end time:  10/24/2023 2:31 AM   Critical care was necessary to treat or prevent imminent or life-threatening deterioration of the following conditions:  Endocrine crisis   Critical care was time spent personally by me on the following activities:  Discussions with consultants, examination of patient, evaluation of patient's response to treatment, ordering and performing treatments and interventions, ordering and review of laboratory studies, ordering and review of radiographic studies, pulse oximetry, re-evaluation of patient's condition and review of old charts   Care discussed with: admitting provider       Medications Ordered in ED Medications  insulin regular, human (MYXREDLIN) 100 units/ 100 mL infusion (has no administration in time range)  dextrose 5 % in lactated ringers infusion (has no administration in time range)  dextrose 50 % solution 0-50 mL (has no administration in time range)   sodium chloride flush (NS) 0.9 % injection 10 mL (has no administration in time range)  insulin aspart (novoLOG) injection 8 Units (8 Units Subcutaneous Given 10/24/23 0012)  lactated ringers bolus 2,178 mL (2,178 mLs Intravenous New Bag/Given 10/24/23 0225)    ED Course/ Medical Decision Making/ A&P                                 Medical Decision Making Patient with DM with fall and elevated glucose   Amount and/or Complexity of Data Reviewed External Data Reviewed: labs and notes.    Details: Previous notes and labs reviewed  Labs: ordered.    Details: White count 5.4, normal, hemoglobin normal 16.2, normal platelets. Sodium 128, but corrects to normal with glucose.  Initial glucose elevated 472 and went up to 682 even after insulin.  Potassium normal 4.4 elevated BUN 24, elevated creatinine 6.75 Radiology: ordered.  Risk Prescription drug management. Decision regarding hospitalization.    Final Clinical Impression(s) / ED Diagnoses Final diagnoses:  Fall, initial encounter  Hyperglycemia   The patient appears reasonably stabilized for admission considering the current resources, flow, and capabilities available in the ED at this time, and I doubt any other [facility] requiring further screening and/or treatment in the ED prior to admission.  Rx / DC Orders ED Discharge Orders     None         Asenath Balash, MD 10/24/23 [phone]

## 2023-10-24 NOTE — Evaluation (Signed)
Physical Therapy Evaluation Patient Details Name: [name] SWEE Sr. MRN: [id number] DOB: 1961/09/13 Today's Date: 10/24/2023  History of Present Illness  Pt is 62 year old presented [facility]  10/23/23 for fall and hyperglycemia after running out of insulin. X-rays of lt knee negative.PMH - htn, cardiomyopathy, CHF, dm, esrd on hd, R BKA, cva  Clinical Impression  Pt admitted with above diagnosis and presents to PT with functional limitations due to deficits listed below (See PT problem list). Pt needs skilled PT to maximize independence and safety. Pt currently very weak and unable to ambulate. Patient will benefit from continued inpatient follow up therapy, <3 hours/day if pt eligible for this setting.           If plan is discharge home, recommend the following: A little help with walking and/or transfers;Assist for transportation;Help with stairs or ramp for entrance;Assistance with cooking/housework;A little help with bathing/dressing/bathroom   Can travel by private vehicle        Equipment Recommendations None recommended by PT  Recommendations for Other Services       Functional Status Assessment Patient has had a recent decline in their functional status and demonstrates the ability to make significant improvements in function in a reasonable and predictable amount of time.     Precautions / Restrictions Precautions Precautions: Fall Restrictions Weight Bearing Restrictions: No      Mobility  Bed Mobility Overal bed mobility: Needs Assistance Bed Mobility: Supine to Sit, Sit to Supine     Supine to sit: Mod assist Sit to supine: Min assist   General bed mobility comments: Assist to elevate trunk into sitting and bring LE's back into bed returning to supine    Transfers Overall transfer level: Needs assistance Equipment used: Rolling walker (2 wheels) Transfers: Sit to/from Stand Sit to Stand: Mod assist, From elevated surface           General transfer  comment: Assist to elevate hips and stabilize    Ambulation/Gait             Pre-gait activities: Side stepped up side of stretcher a few steps with walker and min assist General Gait Details: Pt felt too weak to attempt stepping away from stretcher  Stairs            Wheelchair Mobility     Tilt Bed    Modified Rankin (Stroke Patients Only)       Balance Overall balance assessment: Needs assistance Sitting-balance support: No upper extremity supported, Feet supported Sitting balance-Leahy Scale: Fair     Standing balance support: Bilateral upper extremity supported, During functional activity, Reliant on assistive device for balance Standing balance-Leahy Scale: Poor Standing balance comment: walker and min assist for static standing                             Pertinent Vitals/Pain Pain Assessment Pain Assessment: Faces Faces Pain Scale: Hurts a little bit Pain Location: lt knee Pain Descriptors / Indicators: Sore Pain Intervention(s): Limited activity within patient's tolerance, Monitored during session    Home Living Family/patient expects to be discharged to:: Private residence Living Arrangements: Other relatives (brother) Available Help at Discharge: Family;Available PRN/intermittently Type of Home: House Home Access: Stairs to enter Entrance Stairs-Rails: Right;Left;Can reach both Entrance Stairs-Number of Steps: 4   Home Layout: One level Home Equipment: Agricultural consultant (2 wheels);Wheelchair - manual (prosthetic)      Prior Function Prior Level of Function :  History of Falls (last six months);Needs assist       Physical Assist : Mobility (physical) Mobility (physical): Stairs   Mobility Comments: Uses walker and prosthetic. Has been needing assist for stairs recently       Extremity/Trunk Assessment   Upper Extremity Assessment Upper Extremity Assessment: Defer to OT evaluation    Lower Extremity Assessment Lower  Extremity Assessment: Generalized weakness;RLE deficits/detail RLE Deficits / Details: BKA       Communication   Communication Communication: No apparent difficulties  Cognition Arousal: Alert Behavior During Therapy: WFL for tasks assessed/performed Overall Cognitive Status: Within Functional Limits for tasks assessed                                          General Comments General comments (skin integrity, edema, etc.): VSS on RA    Exercises     Assessment/Plan    PT Assessment Patient needs continued PT services  PT Problem List Decreased strength;Decreased activity tolerance;Decreased balance;Decreased mobility       PT Treatment Interventions DME instruction;Gait training;Functional mobility training;Therapeutic activities;Therapeutic exercise;Balance training;Patient/family education    PT Goals (Current goals can be found in the Care Plan section)  Acute Rehab PT Goals Patient Stated Goal: go to rehab to get stronger PT Goal Formulation: With patient Time For Goal Achievement: 11/07/23 Potential to Achieve Goals: Good    Frequency Min 1X/week     Co-evaluation               AM-PAC PT "6 Clicks" Mobility  Outcome Measure Help needed turning from your back to your side while in a flat bed without using bedrails?: A Little Help needed moving from lying on your back to sitting on the side of a flat bed without using bedrails?: A Lot Help needed moving to and from a bed to a chair (including a wheelchair)?: A Lot Help needed standing up from a chair using your arms (e.g., wheelchair or bedside chair)?: A Lot Help needed to walk in hospital room?: Total Help needed climbing 3-5 steps with a railing? : Total 6 Click Score: 11    End of Session Equipment Utilized During Treatment: Gait belt Activity Tolerance: Patient limited by fatigue Patient left: in bed;with call bell/phone within reach   PT Visit Diagnosis: Unsteadiness on feet  (R26.81);Other abnormalities of gait and mobility (R26.89);Muscle weakness (generalized) (M62.81);History of falling (Z91.81)    Time: 6213-0865 PT Time Calculation (min) (ACUTE ONLY): 21 min   Charges:   PT Evaluation $PT Eval Moderate Complexity: 1 Mod   PT General Charges $$ ACUTE PT VISIT: 1 Visit         [facility] PT Acute Rehabilitation Services Office [phone]   Angelina Ok [facility] 10/24/2023, 12:12 PM

## 2023-10-24 NOTE — ED Notes (Signed)
ED TO INPATIENT HANDOFF REPORT  ED Nurse Name and Phone #: Liz Beach [phone]        S Name/Age/Gender [name] Lance Sr. 62 y.o. male Room/Bed: 002C/002C  Code Status   Code Status: Full Code  Home/SNF/Other Home Patient oriented to: self, place, time, and situation Is this baseline? Yes   Triage Complete: Triage complete  Chief Complaint Hyperglycemia [R73.9]  Triage Note Pt is here with reports of fall yesterday.  Left hip and upper leg/knee pain.  Reports he has been increasingly weak and unable to do ADLs without assistance.  Had dialysis today and was recommended he go back to [facility].     Allergies Allergies  Allergen Reactions   Sulfa Antibiotics Diarrhea and Nausea And Vomiting    Level of Care/Admitting Diagnosis ED Disposition     ED Disposition  Admit   Condition  --   Comment  Hospital Area: [facility] [[id number]]  Level of Care: Telemetry Cardiac [103]  May place patient in observation at [facility] or [facility] if equivalent level of care is available:: Yes  Covid Evaluation: Asymptomatic - no recent exposure (last 10 days) testing not required  Diagnosis: Hyperglycemia [191478]  Admitting Physician: Joseph Art [phone]  Attending Physician: Florene Glen          B Medical/Surgery History Past Medical History:  Diagnosis Date   CHF (congestive heart failure) (HCC)    ECHO 02/05/22  EF 45-50%, DD indeterminent   Diabetes mellitus without complication (HCC)    type 2   ESRD on hemodialysis (HCC)    mon wed fri   Hypertension    Kidney failure    Myocardial infarction (HCC)    Nonischemic cardiomyopathy (HCC)    Past Surgical History:  Procedure Laterality Date   A/V FISTULAGRAM N/A 08/15/2019   Procedure: A/V FISTULAGRAM;  Surgeon: Maeola Harman, MD;  Location: [facility] INVASIVE CV LAB;  Service: Cardiovascular;  Laterality: N/A;   AMPUTATION Right 02/02/2022   Procedure: AMPUTATION 5th RAY;   Surgeon: Louann Sjogren, MD;  Location: [facility] OR;  Service: Podiatry;  Laterality: Right;   AMPUTATION Right 05/23/2022   Procedure: RIGHT BELOW KNEE AMPUTATION;  Surgeon: Nadara Mustard, MD;  Location: [facility] OR;  Service: Orthopedics;  Laterality: Right;   AMPUTATION Right 07/04/2022   Procedure: REVISION AMPUTATION BELOW KNEE;  Surgeon: Nadara Mustard, MD;  Location: [facility] OR;  Service: Orthopedics;  Laterality: Right;   APPLICATION OF WOUND VAC  05/23/2022   Procedure: APPLICATION OF WOUND VAC;  Surgeon: Nadara Mustard, MD;  Location: [facility] OR;  Service: Orthopedics;;   AV FISTULA PLACEMENT     CARDIAC CATHETERIZATION  02/17/2018   LHC 02/17/18 ([name]): 30% pLAD, 30% dRCA, LVEDP 25-30, LVEF 25%.     CATARACT EXTRACTION W/PHACO Right 03/29/2020   Procedure: CATARACT EXTRACTION PHACO AND INTRAOCULAR LENS PLACEMENT (IOC)  RIGHT DIABETIC;  Surgeon: Lockie Mola, MD;  Location: [facility] ORS;  Service: Ophthalmology;  Laterality: Right;  Lot # [id number] H [place]: 02:30.9 AP% 9.5% CDE: 14.38   EYE SURGERY Right    cataract removed   I & D EXTREMITY Right 02/02/2022   Procedure: IRRIGATION AND DEBRIDEMENT RIGHT FOOT;  Surgeon: Louann Sjogren, MD;  Location: [facility] OR;  Service: Podiatry;  Laterality: Right;   INCISION AND DRAINAGE OF WOUND Right 02/08/2022   Procedure: IRRIGATION AND DEBRIDEMENT WOUND;  Surgeon: Asencion Islam, DPM;  Location: [facility] OR;  Service: Podiatry;  Laterality: Right;  with removal  of infected bone    INSERTION OF DIALYSIS CATHETER  08/13/2019   Procedure: Insertion Of Dialysis Catheter;  Surgeon: Larina Earthly, MD;  Location: [facility] OR;  Service: Vascular;;   INSERTION OF DIALYSIS CATHETER Left 07/19/2020   Procedure: INSERTION OF DIALYSIS CATHETER;  Surgeon: Maeola Harman, MD;  Location: [facility] OR;  Service: Vascular;  Laterality: Left;   IR US GUIDE BX ASP/DRAIN  11/07/2022   IRRIGATION AND DEBRIDEMENT ABSCESS     PERIPHERAL VASCULAR BALLOON ANGIOPLASTY  08/15/2019   Procedure:  PERIPHERAL VASCULAR BALLOON ANGIOPLASTY;  Surgeon: Maeola Harman, MD;  Location: [facility] INVASIVE CV LAB;  Service: Cardiovascular;;   PERIPHERAL VASCULAR BALLOON ANGIOPLASTY Left 02/06/2022   Procedure: PERIPHERAL VASCULAR BALLOON ANGIOPLASTY;  Surgeon: Cephus Shelling, MD;  Location: [facility] INVASIVE CV LAB;  Service: Cardiovascular;  Laterality: Left;   REVISON OF ARTERIOVENOUS FISTULA Left 07/19/2020   Procedure: REVISON/PLICATION OF ARTERIOVENOUS FISTULA LEFT;  Surgeon: Maeola Harman, MD;  Location: [facility] OR;  Service: Vascular;  Laterality: Left;   THROMBECTOMY AND REVISION OF ARTERIOVENTOUS (AV) GORETEX  GRAFT Left 08/13/2019   Procedure: THROMBECTOMY OF LEFT ARM ARTERIOVENTOUS (AV) FISTULA;  Surgeon: Larina Earthly, MD;  Location: [facility] OR;  Service: Vascular;  Laterality: Left;   TOE AMPUTATION       A IV Location/Drains/Wounds Patient Lines/Drains/Airways Status     Active Line/Drains/Airways     Name Placement date Placement time Site Days   Peripheral IV 10/24/23 22 G Left;Posterior Hand 10/24/23  0134  Hand  less than 1   Peripheral IV 10/24/23 20 G Anterior;Right Forearm 10/24/23  0300  Forearm  less than 1   Fistula / Graft Left Upper arm Arteriovenous fistula 09/22/15  0114  Upper arm  2954   Pressure Injury 07/02/22 Buttocks Left;Medial Stage 3 -  Full thickness tissue loss. Subcutaneous fat may be visible but bone, tendon or muscle are NOT exposed. 07/02/22  0058  -- 479            Intake/Output Last 24 hours  Intake/Output Summary (Last 24 hours) at 10/24/2023 1606 Last data filed at 10/24/2023 1209 Gross per 24 hour  Intake 145.2 ml  Output --  Net 145.2 ml    Labs/Imaging Results for orders placed or performed during the hospital encounter of 10/23/23 (from the past 48 hour(s))  CBC with Differential     Status: None   Collection Time: 10/23/23  5:42 PM  Result Value Ref Range   WBC 5.4 4.0 - 10.5 K/uL   RBC 5.43 4.22 - 5.81 MIL/uL   Hemoglobin  16.2 13.0 - 17.0 g/dL   HCT 32.4 40.1 - 02.7 %   MCV 89.5 80.0 - 100.0 fL   MCH 29.8 26.0 - 34.0 pg   MCHC 33.3 30.0 - 36.0 g/dL   RDW 25.3 66.4 - 40.3 %   Platelets 161 150 - 400 K/uL   nRBC 0.0 0.0 - 0.2 %   Neutrophils Relative % 56 %   Neutro Abs 3.0 1.7 - 7.7 K/uL   Lymphocytes Relative 23 %   Lymphs Abs 1.2 0.7 - 4.0 K/uL   Monocytes Relative 15 %   Monocytes Absolute 0.8 0.1 - 1.0 K/uL   Eosinophils Relative 5 %   Eosinophils Absolute 0.3 0.0 - 0.5 K/uL   Basophils Relative 1 %   Basophils Absolute 0.1 0.0 - 0.1 K/uL   Immature Granulocytes 0 %   Abs Immature Granulocytes 0.01 0.00 - 0.07 K/uL  Comment: Performed at [facility], [address] [place], [place] [address]  Comprehensive metabolic panel     Status: Abnormal   Collection Time: 10/23/23  5:42 PM  Result Value Ref Range   Sodium 128 (L) 135 - 145 mmol/L   Potassium 4.4 3.5 - 5.1 mmol/L   Chloride 87 (L) 98 - 111 mmol/L   CO2 27 22 - 32 mmol/L   Glucose, Bld 472 (H) 70 - 99 mg/dL    Comment: Glucose reference range applies only to samples taken after fasting for at least 8 hours.   BUN 24 (H) 8 - 23 mg/dL   Creatinine, Ser 6.04 (H) 0.61 - 1.24 mg/dL   Calcium 9.2 8.9 - 54.0 mg/dL   Total Protein 8.2 (H) 6.5 - 8.1 g/dL   Albumin 3.1 (L) 3.5 - 5.0 g/dL   AST 29 15 - 41 U/L   ALT 29 0 - 44 U/L   Alkaline Phosphatase 152 (H) 38 - 126 U/L   Total Bilirubin 0.6 <1.2 mg/dL   GFR, Estimated 9 (L) >60 mL/min    Comment: (NOTE) Calculated using the CKD-EPI Creatinine Equation (2021)    Anion gap 14 5 - 15    Comment: Performed at [facility], [address] [place], [place] [address]  POC CBG, ED     Status: Abnormal   Collection Time: 10/23/23 11:53 PM  Result Value Ref Range   Glucose-Capillary >600 (HH) 70 - 99 mg/dL    Comment: Glucose reference range applies only to samples taken after fasting for at least 8 hours.  CBG monitoring, ED     Status: Abnormal   Collection Time: 10/24/23   1:02 AM  Result Value Ref Range   Glucose-Capillary >600 (HH) 70 - 99 mg/dL    Comment: Glucose reference range applies only to samples taken after fasting for at least 8 hours.  I-stat chem 8, ED (not at [facility], [facility] or [facility])     Status: Abnormal   Collection Time: 10/24/23  1:35 AM  Result Value Ref Range   Sodium 126 (L) 135 - 145 mmol/L   Potassium 5.3 (H) 3.5 - 5.1 mmol/L   Chloride 89 (L) 98 - 111 mmol/L   BUN 47 (H) 8 - 23 mg/dL   Creatinine, Ser 1.47 (H) 0.61 - 1.24 mg/dL   Glucose, Bld 829 (HH) 70 - 99 mg/dL    Comment: Glucose reference range applies only to samples taken after fasting for at least 8 hours.   Calcium, Ion 1.10 (L) 1.15 - 1.40 mmol/L   TCO2 29 22 - 32 mmol/L   Hemoglobin 17.7 (H) 13.0 - 17.0 g/dL   HCT 56.2 13.0 - 86.5 %   Comment NOTIFIED PHYSICIAN   Basic metabolic panel     Status: Abnormal   Collection Time: 10/24/23  2:15 AM  Result Value Ref Range   Sodium 124 (L) 135 - 145 mmol/L   Potassium 4.8 3.5 - 5.1 mmol/L    Comment: HEMOLYSIS AT THIS LEVEL MAY AFFECT RESULT   Chloride 85 (L) 98 - 111 mmol/L   CO2 25 22 - 32 mmol/L   Glucose, Bld 685 (HH) 70 - 99 mg/dL    Comment: CRITICAL RESULT CALLED TO, READ BACK BY AND VERIFIED WITH GASQUE, SYDNEY RN @0248  10/24/23 SATRAINR Glucose reference range applies only to samples taken after fasting for at least 8 hours.    BUN 34 (H) 8 - 23 mg/dL   Creatinine, Ser  7.87 (H) 0.61 - 1.24 mg/dL   Calcium 9.5 8.9 - 86.5 mg/dL   GFR, Estimated 7 (L) >60 mL/min    Comment: (NOTE) Calculated using the CKD-EPI Creatinine Equation (2021)    Anion gap 14 5 - 15    Comment: Performed at [facility], [address], [place], [place] [address]  Osmolality     Status: Abnormal   Collection Time: 10/24/23  2:15 AM  Result Value Ref Range   Osmolality 329 (HH) 275 - 295 mOsm/kg    Comment: REPEATED TO VERIFY CRITICAL RESULT CALLED TO, READ BACK BY AND VERIFIED WITH: GASQUE,S RN 10/24/2023 AT 0248 SKEEN,P Performed  at [facility], [address] [place], [place] [address]   Troponin I (High Sensitivity)     Status: Abnormal   Collection Time: 10/24/23  2:15 AM  Result Value Ref Range   Troponin I (High Sensitivity) 84 (H) <18 ng/L    Comment: (NOTE) Elevated high sensitivity troponin I (hsTnI) values and significant  changes across serial measurements may suggest ACS but many other  chronic and acute conditions are known to elevate hsTnI results.  Refer to the "Links" section for chest pain algorithms and additional  guidance. Performed at [facility], [address] [place], [place] [address]   CBG monitoring, ED     Status: Abnormal   Collection Time: 10/24/23  2:18 AM  Result Value Ref Range   Glucose-Capillary >600 (HH) 70 - 99 mg/dL    Comment: Glucose reference range applies only to samples taken after fasting for at least 8 hours.  CBG monitoring, ED     Status: Abnormal   Collection Time: 10/24/23  3:12 AM  Result Value Ref Range   Glucose-Capillary 469 (H) 70 - 99 mg/dL    Comment: Glucose reference range applies only to samples taken after fasting for at least 8 hours.  CBG monitoring, ED     Status: Abnormal   Collection Time: 10/24/23  3:48 AM  Result Value Ref Range   Glucose-Capillary 365 (H) 70 - 99 mg/dL    Comment: Glucose reference range applies only to samples taken after fasting for at least 8 hours.  CBG monitoring, ED     Status: Abnormal   Collection Time: 10/24/23  4:51 AM  Result Value Ref Range   Glucose-Capillary 282 (H) 70 - 99 mg/dL    Comment: Glucose reference range applies only to samples taken after fasting for at least 8 hours.  Beta-hydroxybutyric acid     Status: None   Collection Time: 10/24/23  6:07 AM  Result Value Ref Range   Beta-Hydroxybutyric Acid 0.19 0.05 - 0.27 mmol/L    Comment: Performed at [facility], [address], [place], [place] [address]  Basic metabolic panel     Status: Abnormal   Collection Time: 10/24/23   6:08 AM  Result Value Ref Range   Sodium 132 (L) 135 - 145 mmol/L   Potassium 4.1 3.5 - 5.1 mmol/L   Chloride 92 (L) 98 - 111 mmol/L   CO2 25 22 - 32 mmol/L   Glucose, Bld 191 (H) 70 - 99 mg/dL    Comment: Glucose reference range applies only to samples taken after fasting for at least 8 hours.   BUN 35 (H) 8 - 23 mg/dL   Creatinine, Ser 2.72 (H) 0.61 - 1.24 mg/dL   Calcium 9.3 8.9 - 53.6 mg/dL   GFR, Estimated 7 (L) >60 mL/min  Comment: (NOTE) Calculated using the CKD-EPI Creatinine Equation (2021)    Anion gap 15 5 - 15    Comment: Performed at [facility], [address], [place], [place] [address]  Troponin I (High Sensitivity)     Status: Abnormal   Collection Time: 10/24/23  6:08 AM  Result Value Ref Range   Troponin I (High Sensitivity) 243 (HH) <18 ng/L    Comment: CRITICAL RESULT CALLED TO, READ BACK BY AND VERIFIED WITH Betsy Pries RN ,  @ [phone] , 10/24/23, [place], T. (NOTE) Elevated high sensitivity troponin I (hsTnI) values and significant  changes across serial measurements may suggest ACS but many other  chronic and acute conditions are known to elevate hsTnI results.  Refer to the "Links" section for chest pain algorithms and additional  guidance. Performed at [facility], [address] [place], [place] [address]   Magnesium     Status: None   Collection Time: 10/24/23  6:08 AM  Result Value Ref Range   Magnesium 1.9 1.7 - 2.4 mg/dL    Comment: Performed at [facility], [address] [place], [place] [address]  Osmolality     Status: None   Collection Time: 10/24/23  6:08 AM  Result Value Ref Range   Osmolality 294 275 - 295 mOsm/kg    Comment: Performed at [facility], [address], [place], [place] [address]  I-Stat venous blood gas, ED     Status: Abnormal   Collection Time: 10/24/23  6:18 AM  Result Value Ref Range   pH, Ven 7.457 (H) 7.25 - 7.43   pCO2, Ven 41.0 (L) 44 - 60 mmHg   pO2, Ven 129 (H) 32 - 45 mmHg    Bicarbonate 28.9 (H) 20.0 - 28.0 mmol/L   TCO2 30 22 - 32 mmol/L   O2 Saturation 99 %   Acid-Base Excess 5.0 (H) 0.0 - 2.0 mmol/L   Sodium 131 (L) 135 - 145 mmol/L   Potassium 4.2 3.5 - 5.1 mmol/L   Calcium, Ion 1.18 1.15 - 1.40 mmol/L   HCT 46.0 39.0 - 52.0 %   Hemoglobin 15.6 13.0 - 17.0 g/dL   Sample type VENOUS   CBG monitoring, ED     Status: Abnormal   Collection Time: 10/24/23  6:23 AM  Result Value Ref Range   Glucose-Capillary 161 (H) 70 - 99 mg/dL    Comment: Glucose reference range applies only to samples taken after fasting for at least 8 hours.  CBG monitoring, ED     Status: Abnormal   Collection Time: 10/24/23  7:37 AM  Result Value Ref Range   Glucose-Capillary 161 (H) 70 - 99 mg/dL    Comment: Glucose reference range applies only to samples taken after fasting for at least 8 hours.   Comment 1 Notify RN    Comment 2 Document in Chart   CBG monitoring, ED     Status: Abnormal   Collection Time: 10/24/23  8:47 AM  Result Value Ref Range   Glucose-Capillary 189 (H) 70 - 99 mg/dL    Comment: Glucose reference range applies only to samples taken after fasting for at least 8 hours.  CBG monitoring, ED     Status: Abnormal   Collection Time: 10/24/23  9:42 AM  Result Value Ref Range   Glucose-Capillary 153 (H) 70 - 99 mg/dL    Comment: Glucose reference range applies only to samples taken after fasting for at least 8  hours.  CBG monitoring, ED     Status: Abnormal   Collection Time: 10/24/23 10:44 AM  Result Value Ref Range   Glucose-Capillary 152 (H) 70 - 99 mg/dL    Comment: Glucose reference range applies only to samples taken after fasting for at least 8 hours.  CBG monitoring, ED     Status: Abnormal   Collection Time: 10/24/23 11:46 AM  Result Value Ref Range   Glucose-Capillary 197 (H) 70 - 99 mg/dL    Comment: Glucose reference range applies only to samples taken after fasting for at least 8 hours.   Comment 1 Notify RN    Comment 2 Document in Chart     DG Knee Complete 4 Views Left  Result Date: 10/24/2023 CLINICAL DATA:  Fall.  No left knee pain. EXAM: LEFT KNEE - COMPLETE 4+ VIEW COMPARISON:  None Available. FINDINGS: No evidence of fracture, dislocation, or joint effusion. No evidence of arthropathy or other focal bone abnormality. Soft tissues are unremarkable. IMPRESSION: Negative. Electronically Signed   By: Kennith Center M.D.   On: 10/24/2023 07:53   CT Renal Stone Study  Result Date: 10/24/2023 CLINICAL DATA:  Abdominal pain EXAM: CT ABDOMEN AND PELVIS WITHOUT CONTRAST TECHNIQUE: Multidetector CT imaging of the abdomen and pelvis was performed following the standard protocol without IV contrast. RADIATION DOSE REDUCTION: This exam was performed according to the departmental dose-optimization program which includes automated exposure control, adjustment of the mA and/or kV according to patient size and/or use of iterative reconstruction technique. COMPARISON:  MRI abdomen dated 10/30/2022 FINDINGS: Lower chest: Mild left basilar scarring/atelectasis. Hepatobiliary: Unenhanced liver is grossly unremarkable, noting a mildly nodular hepatic contour. Cholelithiasis, without associated inflammatory changes. No intrahepatic or extrahepatic duct dilatation. Pancreas: Within normal limits. Spleen: Within normal limits. Adrenals/Urinary Tract: Adrenal glands are within normal limits. Bilateral renal cortical scarring/atrophy. Bilateral renal cysts, measuring up to 18 mm in the posterior right lower kidney (series 3/image 37), benign (Bosniak I). No follow-up is recommended. Bilateral renal vascular calcifications. No renal calculi or hydronephrosis. Bladder is underdistended but unremarkable. Stomach/Bowel: Stomach is within normal limits. No evidence of bowel obstruction. Normal appendix (series 3/image 62). No colonic wall thickening or inflammatory changes. Vascular/Lymphatic: No evidence of abdominal aortic aneurysm. Extensive atherosclerotic  calcifications of the abdominal aorta and branch vessels. No suspicious abdominopelvic lymphadenopathy. Reproductive: Prostate is unremarkable. Other: No abdominopelvic ascites. Musculoskeletal: Mild degenerative changes at L4-5. IMPRESSION: No CT findings to account for the patient's abdominal pain. Cholelithiasis, without associated inflammatory changes. Additional ancillary findings as above. Electronically Signed   By: Charline Bills M.D.   On: 10/24/2023 01:10   DG Pelvis 1-2 Views  Result Date: 10/23/2023 CLINICAL DATA:  Fall with left hip pain EXAM: PELVIS - 1-2 VIEW COMPARISON:  CT 08/05/2019 FINDINGS: SI joints are non widened. Extensive vascular calcifications. No fracture or malalignment. Pubic symphysis and rami appear intact. Mild bilateral hip degenerative change IMPRESSION: No acute osseous abnormality. Electronically Signed   By: Jasmine Pang M.D.   On: 10/23/2023 19:17   DG Femur Min 2 Views Left  Result Date: 10/23/2023 CLINICAL DATA:  Fall with left hip pain EXAM: LEFT FEMUR 2 VIEWS COMPARISON:  None Available. FINDINGS: There is no evidence of fracture or other focal bone lesions. Soft tissues are unremarkable. Extensive vascular calcifications. IMPRESSION: Negative. Electronically Signed   By: Jasmine Pang M.D.   On: 10/23/2023 19:16    Pending Labs [employer] (From admission, onward)     Start  Ordered   10/24/23 0953  Basic metabolic panel  Once,   STAT        10/24/23 0953   10/24/23 0613  Hemoglobin A1c  Once,   R        10/24/23 0612   10/24/23 0607  Blood gas, venous  Once,   R        10/24/23 0607   10/24/23 0603  HIV Antibody (routine testing w rflx)  (HIV Antibody (Routine testing w reflex) panel)  Once,   R        10/24/23 0607   10/24/23 0153  Basic metabolic panel  (Hyperglycemic Hyperosmolar State (HHS))  STAT Now then every 4 hours ,   STAT      10/24/23 0152   10/24/23 0153  Urinalysis, Routine w reflex microscopic -Urine, Clean Catch   (Hyperglycemic Hyperosmolar State (HHS))  ONCE - STAT,   URGENT       Question Answer Comment  Specimen Source Urine, Clean Catch   Release to patient Immediate      10/24/23 0152            Vitals/Pain Today's Vitals   10/24/23 0739 10/24/23 1000 10/24/23 1147 10/24/23 1500  BP: 107/62 110/62 109/65 108/79  Pulse: 82 75 81 85  Resp: 19 14 13 13   Temp: 98.6 F (37 C)  98.2 F (36.8 C) 98.2 F (36.8 C)  TempSrc:    Oral  SpO2: 100% 98% 99% 99%  Weight:      Height:      PainSc:        Isolation Precautions No active isolations  Medications Medications  insulin regular, human (MYXREDLIN) 100 units/ 100 mL infusion (0 Units/hr Intravenous Stopped 10/24/23 1209)  dextrose 50 % solution 0-50 mL (has no administration in time range)  sodium chloride flush (NS) 0.9 % injection 10 mL (10 mLs Intravenous Not Given 10/24/23 0908)  acetaminophen (TYLENOL) tablet 650 mg (has no administration in time range)    Or  acetaminophen (TYLENOL) suppository 650 mg (has no administration in time range)  insulin glargine-yfgn (SEMGLEE) injection 25 Units (25 Units Subcutaneous Given 10/24/23 0943)  insulin aspart (novoLOG) injection 0-9 Units (2 Units Subcutaneous Given 10/24/23 1211)  insulin aspart (novoLOG) injection 0-5 Units (has no administration in time range)  insulin aspart (novoLOG) injection 8 Units (8 Units Subcutaneous Given 10/24/23 0012)  lactated ringers bolus 2,178 mL (0 mLs Intravenous Stopped 10/24/23 0604)    Mobility walks     Focused Assessments Renal Assessment Handoff:  Hemodialysis Schedule: Hemodialysis Schedule: Monday/Wednesday/Friday Last Hemodialysis date and time: 10/23/23   Restricted appendage: left arm   R Recommendations: See Admitting Provider Note  Report given to:   Additional Notes: G

## 2023-10-24 NOTE — H&P (Signed)
History and Physical    [name] FALLONE Sr. UJW:[id number] DOB: 01-31-1961 DOA: 10/23/2023  PCP: Pcp, No  Patient coming from: Home  Chief Complaint: Fall  HPI: [name] Lance Sr. is a 62 y.o. male with medical history significant of ESRD on dialysis TTS, chronic HFmrEF, insulin-dependent diabetes, hypertension, PVD status post right BKA, history of stroke, hypergammaglobulinemia, hyperferritinemia.  Seen in the ED 2 days ago for hyperglycemia, CBG above 600 in the setting of running out of his home insulin a month ago.  He was not in DKA, treated with IV fluids and NovoLog.  Blood glucose had trended down to 300s and he was discharged.  Patient returned to the ED yesterday after a mechanical fall and complained of left hip, thigh, and knee pain.  Noted to be hyperglycemic again with glucose 472.  He was given NovoLog 8 units but remained hyperglycemic with CBG >600 on subsequent checks but not in DKA (bicarb and anion gap normal).  Serum osmolarity 329.  He was started on insulin drip for HHS and was given >2 L IV fluids in the ED.  EKG showing new T wave inversions in inferior and lateral leads and ST elevation in aVL. Initial troponin 84 and repeat pending.  Troponin was previously also in the 70-80 range on labs a year ago in the setting of ESRD. X-ray of left femur and pelvis negative for fractures.  Patient also complained of abdominal pain.  Alk phos chronically elevated, no elevation of transaminases or T. bili.  CT renal stone study showing no acute findings.  TRH called to admit.  Patient states he ran out of his home insulin a month ago and has not picked it up from the pharmacy.  He came into the ED as his blood sugar was high and reporting generalized weakness.  He had a fall at home while trying to get up from his couch and reporting pain in his left hip and left knee.  Denies hitting his head or loss of consciousness.  He reports 1 episode of vomiting yesterday but no longer nauseous or  vomiting.  Denies abdominal pain.  Denies fevers, chills, shortness of breath, or chest pain.  He reports chronic cough for the past 14 years.  Review of Systems:  Review of Systems  All other systems reviewed and are negative.   Past Medical History:  Diagnosis Date   CHF (congestive heart failure) (HCC)    ECHO 02/05/22  EF 45-50%, DD indeterminent   Diabetes mellitus without complication (HCC)    type 2   ESRD on hemodialysis (HCC)    mon wed fri   Hypertension    Kidney failure    Myocardial infarction (HCC)    Nonischemic cardiomyopathy [facility])     Past Surgical History:  Procedure Laterality Date   A/V FISTULAGRAM N/A 08/15/2019   Procedure: A/V FISTULAGRAM;  Surgeon: Maeola Harman, MD;  Location: [facility] INVASIVE CV LAB;  Service: Cardiovascular;  Laterality: N/A;   AMPUTATION Right 02/02/2022   Procedure: AMPUTATION 5th RAY;  Surgeon: Louann Sjogren, MD;  Location: [facility] OR;  Service: Podiatry;  Laterality: Right;   AMPUTATION Right 05/23/2022   Procedure: RIGHT BELOW KNEE AMPUTATION;  Surgeon: Nadara Mustard, MD;  Location: [facility] OR;  Service: Orthopedics;  Laterality: Right;   AMPUTATION Right 07/04/2022   Procedure: REVISION AMPUTATION BELOW KNEE;  Surgeon: Nadara Mustard, MD;  Location: [facility] OR;  Service: Orthopedics;  Laterality: Right;   APPLICATION OF WOUND VAC  05/23/2022  Procedure: APPLICATION OF WOUND VAC;  Surgeon: Nadara Mustard, MD;  Location: [facility] OR;  Service: Orthopedics;;   AV FISTULA PLACEMENT     CARDIAC CATHETERIZATION  02/17/2018   LHC 02/17/18 ([name]): 30% pLAD, 30% dRCA, LVEDP 25-30, LVEF 25%.     CATARACT EXTRACTION W/PHACO Right 03/29/2020   Procedure: CATARACT EXTRACTION PHACO AND INTRAOCULAR LENS PLACEMENT (IOC)  RIGHT DIABETIC;  Surgeon: Lockie Mola, MD;  Location: [facility] ORS;  Service: Ophthalmology;  Laterality: Right;  Lot # [id number] H [place]: 02:30.9 AP% 9.5% CDE: 14.38   EYE SURGERY Right    cataract removed   I & D EXTREMITY Right  02/02/2022   Procedure: IRRIGATION AND DEBRIDEMENT RIGHT FOOT;  Surgeon: Louann Sjogren, MD;  Location: [facility] OR;  Service: Podiatry;  Laterality: Right;   INCISION AND DRAINAGE OF WOUND Right 02/08/2022   Procedure: IRRIGATION AND DEBRIDEMENT WOUND;  Surgeon: Asencion Islam, DPM;  Location: [facility] OR;  Service: Podiatry;  Laterality: Right;  with removal of infected bone    INSERTION OF DIALYSIS CATHETER  08/13/2019   Procedure: Insertion Of Dialysis Catheter;  Surgeon: Larina Earthly, MD;  Location: [facility] OR;  Service: Vascular;;   INSERTION OF DIALYSIS CATHETER Left 07/19/2020   Procedure: INSERTION OF DIALYSIS CATHETER;  Surgeon: Maeola Harman, MD;  Location: [facility] OR;  Service: Vascular;  Laterality: Left;   IR US GUIDE BX ASP/DRAIN  11/07/2022   IRRIGATION AND DEBRIDEMENT ABSCESS     PERIPHERAL VASCULAR BALLOON ANGIOPLASTY  08/15/2019   Procedure: PERIPHERAL VASCULAR BALLOON ANGIOPLASTY;  Surgeon: Maeola Harman, MD;  Location: [facility] INVASIVE CV LAB;  Service: Cardiovascular;;   PERIPHERAL VASCULAR BALLOON ANGIOPLASTY Left 02/06/2022   Procedure: PERIPHERAL VASCULAR BALLOON ANGIOPLASTY;  Surgeon: Cephus Shelling, MD;  Location: [facility] INVASIVE CV LAB;  Service: Cardiovascular;  Laterality: Left;   REVISON OF ARTERIOVENOUS FISTULA Left 07/19/2020   Procedure: REVISON/PLICATION OF ARTERIOVENOUS FISTULA LEFT;  Surgeon: Maeola Harman, MD;  Location: [facility] OR;  Service: Vascular;  Laterality: Left;   THROMBECTOMY AND REVISION OF ARTERIOVENTOUS (AV) GORETEX  GRAFT Left 08/13/2019   Procedure: THROMBECTOMY OF LEFT ARM ARTERIOVENTOUS (AV) FISTULA;  Surgeon: Larina Earthly, MD;  Location: [facility] OR;  Service: Vascular;  Laterality: Left;   TOE AMPUTATION       reports that he has never smoked. He has never used smokeless tobacco. He reports that he does not currently use alcohol. He reports that he does not use drugs.  Allergies  Allergen Reactions   Sulfa Antibiotics Nausea And Vomiting and  Other (See Comments)    "Allergic," per [facility]    Family History  Problem Relation Age of Onset   Hypertension Mother     Prior to Admission medications   Medication Sig Start Date End Date Taking? Authorizing Provider  amLODipine (NORVASC) 5 MG tablet Take 5 mg by mouth daily. 07/15/22   [provider]  aspirin EC 81 MG EC tablet Take 1 tablet (81 mg total) by mouth daily. Swallow whole. 02/21/22   Marguerita Merles Latif, DO  benzonatate (TESSALON) 200 MG capsule Take 200 mg by mouth See admin instructions. Take one capsule by mouth two times a day every Tue, Thu, Sat per [employer]    [provider]  ethyl chloride spray Apply 1 Application topically Every Tuesday,Thursday,and Saturday with dialysis.    [provider]  famotidine (PEPCID) 20 MG tablet Take 20 mg by mouth at bedtime.    [provider]  gabapentin (NEURONTIN) 100 MG capsule  Take 100 mg by mouth at bedtime.    [provider]  guaifenesin (HUMIBID E) 400 MG TABS tablet Take 400 mg by mouth in the morning, at noon, and at bedtime.    [provider]  guaiFENesin (ROBITUSSIN) 100 MG/5ML liquid Take 200 mg by mouth every 4 (four) hours as needed for cough.    [provider]  insulin glargine (LANTUS) 100 UNIT/ML injection Inject 0.25 mLs (25 Units total) into the skin at bedtime. 10/23/23   Geoffery Lyons, MD  ipratropium-albuterol (DUONEB) 0.5-2.5 (3) MG/3ML SOLN Take 3 mLs by nebulization every 4 (four) hours as needed (for wheezing or shortness of breath).    [provider]  loperamide (IMODIUM) 2 MG capsule Take 1 capsule (2 mg total) by mouth as needed for diarrhea or loose stools. Patient taking differently: Take 2 mg by mouth daily as needed for diarrhea or loose stools. 07/08/22   Rodolph Bong, MD  Menthol, Topical Analgesic, (BIOFREEZE) 4 % GEL Apply 1 Application topically every 4 (four) hours as needed (for pain).    [provider]  Multiple  Vitamin (MULTIVITAMIN WITH MINERALS) TABS tablet Take 1 tablet by mouth daily.    [provider]  ondansetron (ZOFRAN) 4 MG tablet Take 4 mg by mouth every 6 (six) hours as needed for nausea or vomiting. Patient not taking: Reported on 09/03/2023 06/30/22   [provider]  oxyCODONE (ROXICODONE) 5 MG immediate release tablet Take 1 tablet (5 mg total) by mouth every 4 (four) hours as needed for severe pain. 03/17/23   Darrick Grinder, PA-C  pantoprazole (PROTONIX) 20 MG tablet Take 20 mg by mouth See admin instructions. Take 20mg  by mouth on mon, wed, fri and sun.    [provider]  senna-docusate (SENOKOT-S) 8.6-50 MG tablet Take 1 tablet by mouth at bedtime as needed for mild constipation. 07/08/22   Rodolph Bong, MD  sevelamer (RENAGEL) 800 MG tablet Take 2,400 mg by mouth See admin instructions. Take 2400mg  by mouth three times daily on mon, wed, fri and sun.    [provider]  sevelamer carbonate (RENVELA) 800 MG tablet Take 2,400 mg by mouth See admin instructions. Take 2400mg  by mouth twice daily on Tues, Thurs, and Saturdays.    [provider]  Simethicone 80 MG TABS Take 160 mg by mouth every 8 (eight) hours as needed (for gas).    [provider]    Physical Exam: Vitals:   10/24/23 0012 10/24/23 0100 10/24/23 0319 10/24/23 0320  BP:  137/79 132/73   Pulse:  95 85   Resp:   16   Temp: 98.3 F (36.8 C)   97.8 F (36.6 C)  TempSrc: Oral   Oral  SpO2:  95% 96% 97%    Physical Exam Vitals reviewed.  Constitutional:      General: He is not in acute distress. HENT:     Head: Normocephalic and atraumatic.  Eyes:     Extraocular Movements: Extraocular movements intact.  Cardiovascular:     Rate and Rhythm: Normal rate and regular rhythm.     Pulses: Normal pulses.  Pulmonary:     Effort: Pulmonary effort is normal. No respiratory distress.     Breath sounds: Normal breath sounds. No wheezing or rales.  Abdominal:      General: Bowel sounds are normal. There is no distension.     Palpations: Abdomen is soft.     Tenderness: There is no abdominal tenderness.  There is no guarding.  Musculoskeletal:     Cervical back: Normal range of motion.     Right lower leg: No edema.     Left lower leg: No edema.     Comments: Left knee: Examination limited as patient was wearing jeans underneath his hospital gown.  Range of motion limited secondary to pain.  Skin:    General: Skin is warm and dry.  Neurological:     General: No focal deficit present.     Mental Status: He is alert and oriented to person, place, and time.     Labs on Admission: I have personally reviewed following labs and imaging studies  CBC: Recent Labs  Lab 10/22/23 2343 10/23/23 1742 10/24/23 0135  WBC 6.2 5.4  --   NEUTROABS  --  3.0  --   HGB 15.6 16.2 17.7*  HCT 47.9 48.6 52.0  MCV 90.9 89.5  --   PLT 202 161  --    Basic Metabolic Panel: Recent Labs  Lab 10/23/23 0009 10/23/23 1742 10/24/23 0135 10/24/23 0215  NA 121* 128* 126* 124*  K 6.0* 4.4 5.3* 4.8  CL 83* 87* 89* 85*  CO2 22 27  --  25  GLUCOSE 699* 472* 682* 685*  BUN 48* 24* 47* 34*  CREATININE 10.30* 6.75* 8.10* 7.87*  CALCIUM 9.5 9.2  --  9.5   GFR: Estimated Creatinine Clearance: 12.8 mL/min (A) (by C-G formula based on SCr of 7.87 mg/dL (H)). Liver Function Tests: Recent Labs  Lab 10/23/23 0009 10/23/23 1742  AST 40 29  ALT 32 29  ALKPHOS 141* 152*  BILITOT 0.8 0.6  PROT 7.9 8.2*  ALBUMIN 3.1* 3.1*   No results for input(s): "LIPASE", "AMYLASE" in the last 168 hours. No results for input(s): "AMMONIA" in the last 168 hours. Coagulation Profile: No results for input(s): "INR", "PROTIME" in the last 168 hours. Cardiac Enzymes: No results for input(s): "CKTOTAL", "CKMB", "CKMBINDEX", "TROPONINI" in the last 168 hours. BNP (last 3 results) No results for input(s): "PROBNP" in the last 8760 hours. HbA1C: No results for input(s): "HGBA1C"  in the last 72 hours. CBG: Recent Labs  Lab 10/24/23 0102 10/24/23 0218 10/24/23 0312 10/24/23 0348 10/24/23 0451  GLUCAP >600* >600* 469* 365* 282*   Lipid Profile: No results for input(s): "CHOL", "HDL", "LDLCALC", "TRIG", "CHOLHDL", "LDLDIRECT" in the last 72 hours. Thyroid Function Tests: No results for input(s): "TSH", "T4TOTAL", "FREET4", "T3FREE", "THYROIDAB" in the last 72 hours. Anemia Panel: No results for input(s): "VITAMINB12", "FOLATE", "FERRITIN", "TIBC", "IRON", "RETICCTPCT" in the last 72 hours. Urine analysis:    Component Value Date/Time   COLORURINE AMBER (A) 07/01/2022 1143   APPEARANCEUR CLEAR 07/01/2022 1143   LABSPEC 1.017 07/01/2022 1143   PHURINE 7.0 07/01/2022 1143   GLUCOSEU NEGATIVE 07/01/2022 1143   HGBUR NEGATIVE 07/01/2022 1143   BILIRUBINUR NEGATIVE 07/01/2022 1143   KETONESUR NEGATIVE 07/01/2022 1143   PROTEINUR >=300 (A) 07/01/2022 1143   NITRITE NEGATIVE 07/01/2022 1143   LEUKOCYTESUR NEGATIVE 07/01/2022 1143    Radiological Exams on Admission: CT Renal Stone Study  Result Date: 10/24/2023 CLINICAL DATA:  Abdominal pain EXAM: CT ABDOMEN AND PELVIS WITHOUT CONTRAST TECHNIQUE: Multidetector CT imaging of the abdomen and pelvis was performed following the standard protocol without IV contrast. RADIATION DOSE REDUCTION: This exam was performed according to the departmental dose-optimization program which includes automated exposure control, adjustment of the mA and/or kV according to patient size and/or use of iterative reconstruction technique. COMPARISON:  MRI abdomen  dated 10/30/2022 FINDINGS: Lower chest: Mild left basilar scarring/atelectasis. Hepatobiliary: Unenhanced liver is grossly unremarkable, noting a mildly nodular hepatic contour. Cholelithiasis, without associated inflammatory changes. No intrahepatic or extrahepatic duct dilatation. Pancreas: Within normal limits. Spleen: Within normal limits. Adrenals/Urinary Tract: Adrenal glands  are within normal limits. Bilateral renal cortical scarring/atrophy. Bilateral renal cysts, measuring up to 18 mm in the posterior right lower kidney (series 3/image 37), benign (Bosniak I). No follow-up is recommended. Bilateral renal vascular calcifications. No renal calculi or hydronephrosis. Bladder is underdistended but unremarkable. Stomach/Bowel: Stomach is within normal limits. No evidence of bowel obstruction. Normal appendix (series 3/image 62). No colonic wall thickening or inflammatory changes. Vascular/Lymphatic: No evidence of abdominal aortic aneurysm. Extensive atherosclerotic calcifications of the abdominal aorta and branch vessels. No suspicious abdominopelvic lymphadenopathy. Reproductive: Prostate is unremarkable. Other: No abdominopelvic ascites. Musculoskeletal: Mild degenerative changes at L4-5. IMPRESSION: No CT findings to account for the patient's abdominal pain. Cholelithiasis, without associated inflammatory changes. Additional ancillary findings as above. Electronically Signed   By: Charline Bills M.D.   On: 10/24/2023 01:10   DG Pelvis 1-2 Views  Result Date: 10/23/2023 CLINICAL DATA:  Fall with left hip pain EXAM: PELVIS - 1-2 VIEW COMPARISON:  CT 08/05/2019 FINDINGS: SI joints are non widened. Extensive vascular calcifications. No fracture or malalignment. Pubic symphysis and rami appear intact. Mild bilateral hip degenerative change IMPRESSION: No acute osseous abnormality. Electronically Signed   By: Jasmine Pang M.D.   On: 10/23/2023 19:17   DG Femur Min 2 Views Left  Result Date: 10/23/2023 CLINICAL DATA:  Fall with left hip pain EXAM: LEFT FEMUR 2 VIEWS COMPARISON:  None Available. FINDINGS: There is no evidence of fracture or other focal bone lesions. Soft tissues are unremarkable. Extensive vascular calcifications. IMPRESSION: Negative. Electronically Signed   By: Jasmine Pang M.D.   On: 10/23/2023 19:16    EKG: Independently reviewed.  Sinus rhythm, LAFB, QTc  526.  New T wave inversions in inferior and lateral leads and ST elevations in aVL.  Assessment and Plan  Hyperglycemia/suspected HHS Insulin-dependent diabetes Patient ran out of his home insulin a month ago and has not picked it up from the pharmacy.  CBG >600 without signs of DKA (bicarb and anion gap normal).  Serum osmolarity 329.  Continue insulin drip until HHS resolves.  Cautious fluid replacement as he is ESRD on dialysis.  Frequent CBG checks every 1 hour.  VBG ordered to check pH and also check beta hydroxybutyric acid level.  Monitor BMP and serum osmolarity.  Keep n.p.o. Last A1c 5.2 on labs a year ago, repeat ordered.  Acute EKG abnormality EKG showing new T wave inversions in inferior and lateral leads and ST elevation in aVL. Initial troponin 84 and repeat pending.  Troponin was previously also in the 70-80 range on labs a year ago in the setting of ESRD.  ACS less likely as patient denies chest pain and resting comfortably.  Follow-up repeat troponin.  ESRD on dialysis TTS Monitor for signs of volume overload.  Consult nephrology in the morning for dialysis.  Chronic HFmrEF Last echo done in March 2023 showing EF 45 to 50%, RV systolic function mildly reduced, mild mitral valve regurgitation, mild aortic valve stenosis.  Volume management with dialysis, consult nephrology in the morning.  QT prolongation QTc 526 on EKG.  Monitor potassium and magnesium levels, avoid QT prolonging drugs.  Repeat EKG in the morning.  Generalized weakness/fall at home Left knee pain Left knee range of motion limited,  x-ray ordered.  PT/OT eval, fall precautions.  Hypertension: Currently normotensive. History of stroke PVD Pharmacy med rec pending.  DVT prophylaxis: SCDs until x-ray of left knee is done. Code Status: Full Code (discussed with the patient) Level of care: Progressive Care Unit Admission status: It is my clinical opinion that referral for OBSERVATION is reasonable and  necessary in this patient based on the above information provided. The aforementioned taken together are felt to place the patient at high risk for further clinical deterioration. However, it is anticipated that the patient may be medically stable for discharge from the hospital within 24 to 48 hours.  John Giovanni MD [facility]  If 7PM-7AM, please contact night-coverage [email or web address]  10/24/2023, 4:56 AM

## 2023-10-24 NOTE — Consult Note (Signed)
Reason for Consult: Continuity of ESRD care Referring Physician: Marlin Canary, DO [facility])  HPI:  62 year old man with past medical history significant for insulin-dependent diabetes, hypertension, peripheral arterial disease status post right below-knee amputation, history of CVA, hypergammaglobulinemia and end-stage renal disease on hemodialysis on a MWF schedule.  Presented to the emergency room yesterday after mechanical fall complaining of left hip, knee and thigh pain and found to be hypoglycemic without any evidence of DKA.  He had earlier gone to his scheduled dialysis that he completed without problems.  Initial evaluation in the emergency room showed no evidence of osseous injury.  When seen this afternoon, he only complains of some mild left-sided hip/thigh pain.  Dialysis prescription: MWF, [facility], 4 hours 15 minutes, BFR 450/DFR 700, EDW 104 kg, 2K/2.0 calcium, UF profile #4, left upper arm AV fistula, 7000 units heparin bolus with 2000 units mid treatment, Hectorol 7 mcg q. hemodialysis  Past Medical History:  Diagnosis Date   CHF (congestive heart failure) (HCC)    ECHO 02/05/22  EF 45-50%, DD indeterminent   Diabetes mellitus without complication (HCC)    type 2   ESRD on hemodialysis (HCC)    mon wed fri   Hypertension    Kidney failure    Myocardial infarction (HCC)    Nonischemic cardiomyopathy [facility])     Past Surgical History:  Procedure Laterality Date   A/V FISTULAGRAM N/A 08/15/2019   Procedure: A/V FISTULAGRAM;  Surgeon: Maeola Harman, MD;  Location: [facility] INVASIVE CV LAB;  Service: Cardiovascular;  Laterality: N/A;   AMPUTATION Right 02/02/2022   Procedure: AMPUTATION 5th RAY;  Surgeon: Louann Sjogren, MD;  Location: [facility] OR;  Service: Podiatry;  Laterality: Right;   AMPUTATION Right 05/23/2022   Procedure: RIGHT BELOW KNEE AMPUTATION;  Surgeon: Nadara Mustard, MD;  Location: [facility] OR;  Service: Orthopedics;  Laterality: Right;   AMPUTATION  Right 07/04/2022   Procedure: REVISION AMPUTATION BELOW KNEE;  Surgeon: Nadara Mustard, MD;  Location: [facility] OR;  Service: Orthopedics;  Laterality: Right;   APPLICATION OF WOUND VAC  05/23/2022   Procedure: APPLICATION OF WOUND VAC;  Surgeon: Nadara Mustard, MD;  Location: [facility] OR;  Service: Orthopedics;;   AV FISTULA PLACEMENT     CARDIAC CATHETERIZATION  02/17/2018   LHC 02/17/18 ([name]): 30% pLAD, 30% dRCA, LVEDP 25-30, LVEF 25%.     CATARACT EXTRACTION W/PHACO Right 03/29/2020   Procedure: CATARACT EXTRACTION PHACO AND INTRAOCULAR LENS PLACEMENT (IOC)  RIGHT DIABETIC;  Surgeon: Lockie Mola, MD;  Location: [facility] ORS;  Service: Ophthalmology;  Laterality: Right;  Lot # [id number] H [place]: 02:30.9 AP% 9.5% CDE: 14.38   EYE SURGERY Right    cataract removed   I & D EXTREMITY Right 02/02/2022   Procedure: IRRIGATION AND DEBRIDEMENT RIGHT FOOT;  Surgeon: Louann Sjogren, MD;  Location: [facility] OR;  Service: Podiatry;  Laterality: Right;   INCISION AND DRAINAGE OF WOUND Right 02/08/2022   Procedure: IRRIGATION AND DEBRIDEMENT WOUND;  Surgeon: Asencion Islam, DPM;  Location: [facility] OR;  Service: Podiatry;  Laterality: Right;  with removal of infected bone    INSERTION OF DIALYSIS CATHETER  08/13/2019   Procedure: Insertion Of Dialysis Catheter;  Surgeon: Larina Earthly, MD;  Location: [facility] OR;  Service: Vascular;;   INSERTION OF DIALYSIS CATHETER Left 07/19/2020   Procedure: INSERTION OF DIALYSIS CATHETER;  Surgeon: Maeola Harman, MD;  Location: [facility] OR;  Service: Vascular;  Laterality: Left;   IR US GUIDE BX ASP/DRAIN  11/07/2022  IRRIGATION AND DEBRIDEMENT ABSCESS     PERIPHERAL VASCULAR BALLOON ANGIOPLASTY  08/15/2019   Procedure: PERIPHERAL VASCULAR BALLOON ANGIOPLASTY;  Surgeon: Maeola Harman, MD;  Location: [facility] INVASIVE CV LAB;  Service: Cardiovascular;;   PERIPHERAL VASCULAR BALLOON ANGIOPLASTY Left 02/06/2022   Procedure: PERIPHERAL VASCULAR BALLOON ANGIOPLASTY;  Surgeon:  Cephus Shelling, MD;  Location: [facility] INVASIVE CV LAB;  Service: Cardiovascular;  Laterality: Left;   REVISON OF ARTERIOVENOUS FISTULA Left 07/19/2020   Procedure: REVISON/PLICATION OF ARTERIOVENOUS FISTULA LEFT;  Surgeon: Maeola Harman, MD;  Location: [facility] OR;  Service: Vascular;  Laterality: Left;   THROMBECTOMY AND REVISION OF ARTERIOVENTOUS (AV) GORETEX  GRAFT Left 08/13/2019   Procedure: THROMBECTOMY OF LEFT ARM ARTERIOVENTOUS (AV) FISTULA;  Surgeon: Larina Earthly, MD;  Location: [facility] OR;  Service: Vascular;  Laterality: Left;   TOE AMPUTATION      Family History  Problem Relation Age of Onset   Hypertension Mother     Social History:  reports that he has never smoked. He has never used smokeless tobacco. He reports that he does not currently use alcohol. He reports that he does not use drugs.  Allergies:  Allergies  Allergen Reactions   Sulfa Antibiotics Diarrhea and Nausea And Vomiting    Medications: I have reviewed the patient's current medications. Scheduled:  insulin aspart  0-5 Units Subcutaneous QHS   insulin aspart  0-9 Units Subcutaneous TID WC   insulin glargine-yfgn  25 Units Subcutaneous Daily   sodium chloride flush  10 mL Intravenous Q12H   Continuous:  insulin Stopped (10/24/23 1209)      Latest Ref Rng & Units 10/24/2023    6:18 AM 10/24/2023    6:08 AM 10/24/2023    2:15 AM  BMP  Glucose 70 - 99 mg/dL  578  469   BUN 8 - 23 mg/dL  35  34   Creatinine 6.29 - 1.24 mg/dL  5.28  4.13   Sodium 244 - 145 mmol/L 131  132  124   Potassium 3.5 - 5.1 mmol/L 4.2  4.1  4.8   Chloride 98 - 111 mmol/L  92  85   CO2 22 - 32 mmol/L  25  25   Calcium 8.9 - 10.3 mg/dL  9.3  9.5       Latest Ref Rng & Units 10/24/2023    6:18 AM 10/24/2023    1:35 AM 10/23/2023    5:42 PM  CBC  WBC 4.0 - 10.5 K/uL   5.4   Hemoglobin 13.0 - 17.0 g/dL 01.0  27.2  53.6   Hematocrit 39.0 - 52.0 % 46.0  52.0  48.6   Platelets 150 - 400 K/uL   161     DG Knee Complete 4 Views  Left  Result Date: 10/24/2023 CLINICAL DATA:  Fall.  No left knee pain. EXAM: LEFT KNEE - COMPLETE 4+ VIEW COMPARISON:  None Available. FINDINGS: No evidence of fracture, dislocation, or joint effusion. No evidence of arthropathy or other focal bone abnormality. Soft tissues are unremarkable. IMPRESSION: Negative. Electronically Signed   By: Kennith Center M.D.   On: 10/24/2023 07:53   CT Renal Stone Study  Result Date: 10/24/2023 CLINICAL DATA:  Abdominal pain EXAM: CT ABDOMEN AND PELVIS WITHOUT CONTRAST TECHNIQUE: Multidetector CT imaging of the abdomen and pelvis was performed following the standard protocol without IV contrast. RADIATION DOSE REDUCTION: This exam was performed according to the departmental dose-optimization program which includes automated exposure control, adjustment of the mA and/or  kV according to patient size and/or use of iterative reconstruction technique. COMPARISON:  MRI abdomen dated 10/30/2022 FINDINGS: Lower chest: Mild left basilar scarring/atelectasis. Hepatobiliary: Unenhanced liver is grossly unremarkable, noting a mildly nodular hepatic contour. Cholelithiasis, without associated inflammatory changes. No intrahepatic or extrahepatic duct dilatation. Pancreas: Within normal limits. Spleen: Within normal limits. Adrenals/Urinary Tract: Adrenal glands are within normal limits. Bilateral renal cortical scarring/atrophy. Bilateral renal cysts, measuring up to 18 mm in the posterior right lower kidney (series 3/image 37), benign (Bosniak I). No follow-up is recommended. Bilateral renal vascular calcifications. No renal calculi or hydronephrosis. Bladder is underdistended but unremarkable. Stomach/Bowel: Stomach is within normal limits. No evidence of bowel obstruction. Normal appendix (series 3/image 62). No colonic wall thickening or inflammatory changes. Vascular/Lymphatic: No evidence of abdominal aortic aneurysm. Extensive atherosclerotic calcifications of the abdominal aorta  and branch vessels. No suspicious abdominopelvic lymphadenopathy. Reproductive: Prostate is unremarkable. Other: No abdominopelvic ascites. Musculoskeletal: Mild degenerative changes at L4-5. IMPRESSION: No CT findings to account for the patient's abdominal pain. Cholelithiasis, without associated inflammatory changes. Additional ancillary findings as above. Electronically Signed   By: Charline Bills M.D.   On: 10/24/2023 01:10   DG Pelvis 1-2 Views  Result Date: 10/23/2023 CLINICAL DATA:  Fall with left hip pain EXAM: PELVIS - 1-2 VIEW COMPARISON:  CT 08/05/2019 FINDINGS: SI joints are non widened. Extensive vascular calcifications. No fracture or malalignment. Pubic symphysis and rami appear intact. Mild bilateral hip degenerative change IMPRESSION: No acute osseous abnormality. Electronically Signed   By: Jasmine Pang M.D.   On: 10/23/2023 19:17   DG Femur Min 2 Views Left  Result Date: 10/23/2023 CLINICAL DATA:  Fall with left hip pain EXAM: LEFT FEMUR 2 VIEWS COMPARISON:  None Available. FINDINGS: There is no evidence of fracture or other focal bone lesions. Soft tissues are unremarkable. Extensive vascular calcifications. IMPRESSION: Negative. Electronically Signed   By: Jasmine Pang M.D.   On: 10/23/2023 19:16    Review of Systems  Constitutional: Negative.   HENT: Negative.    Eyes: Negative.   Respiratory: Negative.    Cardiovascular: Negative.   Gastrointestinal: Negative.   Musculoskeletal:  Positive for arthralgias and myalgias.       Right hip/knee with thigh pain status post fall  Skin:  Negative for rash and wound.  Neurological: Negative.    Blood pressure 109/65, pulse 81, temperature 98.2 F (36.8 C), resp. rate 13, height 6\' 1"  (1.854 m), weight 108.9 kg, SpO2 99%. Physical Exam Vitals and nursing note reviewed.  Constitutional:      Appearance: Normal appearance. He is obese. He is not ill-appearing or toxic-appearing.  HENT:     Head: Normocephalic and  atraumatic.     Right Ear: External ear normal.     Left Ear: External ear normal.     Nose: Nose normal.     Mouth/Throat:     Mouth: Mucous membranes are moist.     Pharynx: Oropharynx is clear.  Eyes:     General: No scleral icterus.    Extraocular Movements: Extraocular movements intact.     Conjunctiva/sclera: Conjunctivae normal.  Cardiovascular:     Rate and Rhythm: Normal rate and regular rhythm.     Pulses: Normal pulses.     Heart sounds: Normal heart sounds.  Pulmonary:     Effort: Pulmonary effort is normal.     Breath sounds: Normal breath sounds. No wheezing or rales.  Abdominal:     General: Abdomen is flat. Bowel sounds are  normal.     Palpations: Abdomen is soft.     Tenderness: There is no guarding.  Musculoskeletal:     Cervical back: Normal range of motion and neck supple.     Left lower leg: No edema.     Comments: Status post right below-knee amputation, left upper arm AV fistula with palpable thrill  Skin:    General: Skin is warm and dry.  Neurological:     General: No focal deficit present.     Mental Status: He is alert.     Assessment/Plan: 1.  Hyperglycemia with hyperosmolality: Without evidence of diabetic ketoacidosis, he has unfortunately not taken insulin for the past month and was started on insulin drip.  Additional management per primary service. 2.  End-stage renal disease: Usually on hemodialysis on MWF schedule and will get dialysis again on Monday.  He completed his entire dialysis treatment yesterday and does not have acute indication for dialysis today. 3.  Left knee pain status post mechanical fall: Evaluation negative for osseous injury, symptomatic management. 4.  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  Resume VDR a with dialysis. 5.  Hypertension: Blood pressures at goal, continue to monitor for orthostasis.  Dagoberto Ligas 10/24/2023, 1:43 PM

## 2023-10-24 NOTE — Progress Notes (Addendum)
Patient admitted after midnight, please see H&P.  Here with hyperglycemia from not taking home insulin.  Plan to transition over to home insulin.  Nephrology consulted for HD.    Troponin trending up-- check echo and monitor on tele  Marlin Canary DO

## 2023-10-24 NOTE — ED Notes (Signed)
CBG 197.

## 2023-10-25 ENCOUNTER — Other Ambulatory Visit (HOSPITAL_BASED_OUTPATIENT_CLINIC_OR_DEPARTMENT_OTHER): Payer: Medicare Other

## 2023-10-25 DIAGNOSIS — R7989 Other specified abnormal findings of blood chemistry: Secondary | ICD-10-CM

## 2023-10-25 DIAGNOSIS — E1100 Type 2 diabetes mellitus with hyperosmolarity without nonketotic hyperglycemic-hyperosmolar coma (NKHHC): Secondary | ICD-10-CM | POA: Diagnosis not present

## 2023-10-25 DIAGNOSIS — R9431 Abnormal electrocardiogram [ECG] [EKG]: Secondary | ICD-10-CM

## 2023-10-25 DIAGNOSIS — E1165 Type 2 diabetes mellitus with hyperglycemia: Secondary | ICD-10-CM | POA: Diagnosis not present

## 2023-10-25 LAB — ECHOCARDIOGRAM COMPLETE
AR max vel: 0.85 cm2
AV Area VTI: 0.81 cm2
AV Area mean vel: 0.8 cm2
AV Mean grad: 18 mm[Hg]
AV Peak grad: 31.4 mm[Hg]
Ao pk vel: 2.8 m/s
Area-P 1/2: 4.17 cm2
Height: 73 in
MV VTI: 1.38 cm2
S' Lateral: 4.1 cm
Weight: 3839.99 [oz_av]

## 2023-10-25 LAB — CBC WITH DIFFERENTIAL/PLATELET
Abs Immature Granulocytes: 0.01 10*3/uL (ref 0.00–0.07)
Basophils Absolute: 0.1 10*3/uL (ref 0.0–0.1)
Basophils Relative: 1 %
Eosinophils Absolute: 0.2 10*3/uL (ref 0.0–0.5)
Eosinophils Relative: 4 %
HCT: 44.7 % (ref 39.0–52.0)
Hemoglobin: 14.6 g/dL (ref 13.0–17.0)
Immature Granulocytes: 0 %
Lymphocytes Relative: 27 %
Lymphs Abs: 1.8 10*3/uL (ref 0.7–4.0)
MCH: 29 pg (ref 26.0–34.0)
MCHC: 32.7 g/dL (ref 30.0–36.0)
MCV: 88.9 fL (ref 80.0–100.0)
Monocytes Absolute: 1.1 10*3/uL — ABNORMAL HIGH (ref 0.1–1.0)
Monocytes Relative: 16 %
Neutro Abs: 3.5 10*3/uL (ref 1.7–7.7)
Neutrophils Relative %: 52 %
Platelets: 180 10*3/uL (ref 150–400)
RBC: 5.03 MIL/uL (ref 4.22–5.81)
RDW: 14.5 % (ref 11.5–15.5)
WBC: 6.7 10*3/uL (ref 4.0–10.5)
nRBC: 0 % (ref 0.0–0.2)

## 2023-10-25 LAB — BLOOD GAS, VENOUS
Acid-Base Excess: 4.7 mmol/L — ABNORMAL HIGH (ref 0.0–2.0)
Bicarbonate: 29.2 mmol/L — ABNORMAL HIGH (ref 20.0–28.0)
O2 Saturation: 81.6 %
Patient temperature: 36.8
pCO2, Ven: 42 mm[Hg] — ABNORMAL LOW (ref 44–60)
pH, Ven: 7.45 — ABNORMAL HIGH (ref 7.25–7.43)
pO2, Ven: 46 mm[Hg] — ABNORMAL HIGH (ref 32–45)

## 2023-10-25 LAB — RENAL FUNCTION PANEL
Albumin: 2.9 g/dL — ABNORMAL LOW (ref 3.5–5.0)
Anion gap: 15 (ref 5–15)
BUN: 49 mg/dL — ABNORMAL HIGH (ref 8–23)
CO2: 23 mmol/L (ref 22–32)
Calcium: 10 mg/dL (ref 8.9–10.3)
Chloride: 88 mmol/L — ABNORMAL LOW (ref 98–111)
Creatinine, Ser: 10.15 mg/dL — ABNORMAL HIGH (ref 0.61–1.24)
GFR, Estimated: 5 mL/min — ABNORMAL LOW (ref >60)
Glucose, Bld: 275 mg/dL — ABNORMAL HIGH (ref 70–99)
Phosphorus: 6.3 mg/dL — ABNORMAL HIGH (ref 2.5–4.6)
Potassium: 5.2 mmol/L — ABNORMAL HIGH (ref 3.5–5.1)
Sodium: 126 mmol/L — ABNORMAL LOW (ref 135–145)

## 2023-10-25 LAB — GLUCOSE, CAPILLARY
Glucose-Capillary: 328 mg/dL — ABNORMAL HIGH (ref 70–99)
Glucose-Capillary: 304 mg/dL — ABNORMAL HIGH (ref 70–99)
Glucose-Capillary: 238 mg/dL — ABNORMAL HIGH (ref 70–99)
Glucose-Capillary: 300 mg/dL — ABNORMAL HIGH (ref 70–99)

## 2023-10-25 LAB — HEPATITIS B SURFACE ANTIGEN: Hepatitis B Surface Ag: NONREACTIVE

## 2023-10-25 LAB — TROPONIN I (HIGH SENSITIVITY): Troponin I (High Sensitivity): 296 ng/L (ref <18)

## 2023-10-25 LAB — HIV ANTIBODY (ROUTINE TESTING W REFLEX): HIV Screen 4th Generation wRfx: NONREACTIVE

## 2023-10-25 LAB — MAGNESIUM: Magnesium: 2.2 mg/dL (ref 1.7–2.4)

## 2023-10-25 MED ORDER — HEPARIN SODIUM (PORCINE) 5000 UNIT/ML IJ SOLN
5000.0000 [IU] | Freq: Three times a day (TID) | INTRAMUSCULAR | Status: DC
  Administered 2023-10-25 – 2023-10-28 (×8): 5000 [IU] via SUBCUTANEOUS
  Filled 2023-10-25 (×8): qty 1

## 2023-10-25 MED ORDER — CHLORHEXIDINE GLUCONATE CLOTH 2 % EX PADS
6.0000 | MEDICATED_PAD | Freq: Every day | CUTANEOUS | Status: DC
  Administered 2023-10-26 – 2023-10-28 (×3): 6 via TOPICAL

## 2023-10-25 NOTE — Care Management Obs Status (Signed)
MEDICARE OBSERVATION STATUS NOTIFICATION   Patient Details  Name: [name] CONTO Sr. MRN: [id number] Date of Birth: 01/15/1961   Medicare Observation Status Notification Given:  Yes    Ronny Bacon, RN 10/25/2023, 8:52 AM

## 2023-10-25 NOTE — Progress Notes (Signed)
PROGRESS NOTE  [name] Lance Sr. OZH:[id number] DOB: 1961/06/19 DOA: 10/23/2023 PCP: Pcp, No  HPI/Recap of past 24 hours: [name] Lance Sr. is a 62 y.o. male with medical history significant of ESRD on dialysis MWF, CHF, DM, HTN, PVD status post right BKA, history of CVA, hypergammaglobulinemia, hyperferritinemia. Patient presented to the ED after a mechanical fall and complained of left hip, thigh, and knee pain.  Noted to be hyperglycemic (ran out of home insulin) with CBG >600 on subsequent checks but not in DKA (bicarb and anion gap normal).  Serum osmolarity 329.  He was started on insulin drip for HHS and was given >2 L IV fluids in the ED.  EKG showing new T wave inversions in inferior and lateral leads and ST elevation in aVL. Troponin rising, in the setting of ESRD. X-ray of left femur and pelvis negative for fractures.  Patient also complained of abdominal pain. CT renal stone study showing no acute findings.  TRH called to admit.    Today, patient denies any chest pain, worsening shortness of breath, abdominal pain, nausea/vomiting, fever/chills.  Reports significant neuropathy in left hand     Assessment/Plan: Principal Problem:   Hyperosmolar hyperglycemic state (HHS) (HCC) Active Problems:   ESRD on dialysis (HCC)   HTN (hypertension)   CHF (congestive heart failure) (HCC)   Hyperglycemia   Abnormal EKG  Hyperglycemia/suspected HHS DM type II, uncontrolled Last A1c 5.2 on 10/2022, repeat pending CBG >600 without signs of DKA (bicarb and anion gap normal), Serum osmolarity 329 S/p insulin drip for HHS SSI, Semglee, Accu-Cheks, hypoglycemic protocol  Elevated troponin In the setting of ESRD EKG showing new T wave inversions in inferior and lateral leads and ST elevation in aVL Initial troponin 84-->243-->558 ECHO pending Cardiology consult pending ECHO (Pt currently asymptomatic, but high risk)  ESRD on dialysis MWF Monitor for signs of volume overload Nephrology  for HD   Chronic systolic HF Last echo done in March 2023 showing EF 45 to 50%, RV systolic function mildly reduced, mild mitral valve regurgitation, mild aortic valve stenosis Volume management with dialysis   QT prolongation QTc 526 on EKG Monitor potassium and magnesium levels, avoid QT prolonging drugs   Generalized weakness/fall at home Left knee pain Left knee range of motion limited Left knee x-ray, no evidence of fracture dislocation or joint effusion PT/OT eval, fall precautions.   Hypertension Currently normotensive  History of CVA PVD Right BKA  Obesity Lifestyle modification advised     Estimated body mass index is 31.66 kg/m as calculated from the following:   Height as of this encounter: 6\' 1"  (1.854 m).   Weight as of this encounter: 108.9 kg.     Code Status: Full  Family Communication: None at bedside  Disposition Plan: Status is: Observation The patient will require care spanning > 2 midnights and should be moved to inpatient because: Level of care      Consultants: None  Procedures: None  Antimicrobials: None  DVT prophylaxis: Heparin    Objective: Vitals:   10/25/23 0213 10/25/23 0553 10/25/23 0805 10/25/23 1351  BP:  127/71 124/75 123/73  Pulse:  77    Resp:  17    Temp: 98.2 F (36.8 C) 98 F (36.7 C) 97.8 F (36.6 C)   TempSrc: Oral Oral Oral   SpO2:  91% 95%   Weight:      Height:        Intake/Output Summary (Last 24 hours) at 10/25/2023 1422 Last  data filed at 10/25/2023 0903 Gross per 24 hour  Intake 480 ml  Output --  Net 480 ml   Filed Weights   10/24/23 0604  Weight: 108.9 kg    Exam: General: NAD, chronically ill-appearing Cardiovascular: S1, S2 present Respiratory: CTAB Abdomen: Soft, nontender, nondistended, bowel sounds present Musculoskeletal: No bilateral pedal edema noted, right BKA, left upper arm AV fistula Skin: Normal Psychiatry: Normal mood     Data Reviewed: CBC: Recent Labs   Lab 10/22/23 2343 10/23/23 1742 10/24/23 0135 10/24/23 0618  WBC 6.2 5.4  --   --   NEUTROABS  --  3.0  --   --   HGB 15.6 16.2 17.7* 15.6  HCT 47.9 48.6 52.0 46.0  MCV 90.9 89.5  --   --   PLT 202 161  --   --    Basic Metabolic Panel: Recent Labs  Lab 10/23/23 0009 10/23/23 1742 10/24/23 0135 10/24/23 0215 10/24/23 0608 10/24/23 0618 10/24/23 1655  NA 121* 128* 126* 124* 132* 131* 127*  K 6.0* 4.4 5.3* 4.8 4.1 4.2 4.9  CL 83* 87* 89* 85* 92*  --  89*  CO2 22 27  --  25 25  --  26  GLUCOSE 699* 472* 682* 685* 191*  --  202*  BUN 48* 24* 47* 34* 35*  --  39*  CREATININE 10.30* 6.75* 8.10* 7.87* 7.95*  --  7.99*  CALCIUM 9.5 9.2  --  9.5 9.3  --  10.1  MG  --   --   --   --  1.9  --   --    GFR: Estimated Creatinine Clearance: 12.6 mL/min (A) (by C-G formula based on SCr of 7.99 mg/dL (H)). Liver Function Tests: Recent Labs  Lab 10/23/23 0009 10/23/23 1742  AST 40 29  ALT 32 29  ALKPHOS 141* 152*  BILITOT 0.8 0.6  PROT 7.9 8.2*  ALBUMIN 3.1* 3.1*   No results for input(s): "LIPASE", "AMYLASE" in the last 168 hours. No results for input(s): "AMMONIA" in the last 168 hours. Coagulation Profile: No results for input(s): "INR", "PROTIME" in the last 168 hours. Cardiac Enzymes: No results for input(s): "CKTOTAL", "CKMB", "CKMBINDEX", "TROPONINI" in the last 168 hours. BNP (last 3 results) No results for input(s): "PROBNP" in the last 8760 hours. HbA1C: No results for input(s): "HGBA1C" in the last 72 hours. CBG: Recent Labs  Lab 10/24/23 1146 10/24/23 1640 10/24/23 2103 10/25/23 0554 10/25/23 1154  GLUCAP 197* 207* 330* 328* 304*   Lipid Profile: No results for input(s): "CHOL", "HDL", "LDLCALC", "TRIG", "CHOLHDL", "LDLDIRECT" in the last 72 hours. Thyroid Function Tests: No results for input(s): "TSH", "T4TOTAL", "FREET4", "T3FREE", "THYROIDAB" in the last 72 hours. Anemia Panel: No results for input(s): "VITAMINB12", "FOLATE", "FERRITIN", "TIBC",  "IRON", "RETICCTPCT" in the last 72 hours. Urine analysis:    Component Value Date/Time   COLORURINE AMBER (A) 07/01/2022 1143   APPEARANCEUR CLEAR 07/01/2022 1143   LABSPEC 1.017 07/01/2022 1143   PHURINE 7.0 07/01/2022 1143   GLUCOSEU NEGATIVE 07/01/2022 1143   HGBUR NEGATIVE 07/01/2022 1143   BILIRUBINUR NEGATIVE 07/01/2022 1143   KETONESUR NEGATIVE 07/01/2022 1143   PROTEINUR >=300 (A) 07/01/2022 1143   NITRITE NEGATIVE 07/01/2022 1143   LEUKOCYTESUR NEGATIVE 07/01/2022 1143   Sepsis Labs: @LABRCNTIP (procalcitonin:4,lacticidven:4)  )No results found for this or any previous visit (from the past 240 hour(s)).    Studies: No results found.  Scheduled Meds:  cinacalcet  30 mg Oral Daily   insulin aspart  0-5 Units Subcutaneous QHS   insulin aspart  0-9 Units Subcutaneous TID WC   insulin glargine-yfgn  25 Units Subcutaneous Daily   sevelamer carbonate  4,000 mg Oral TID WC   sodium chloride flush  10 mL Intravenous Q12H    Continuous Infusions:   LOS: 0 days     Briant Cedar, MD [facility]  If 7PM-7AM, please contact night-coverage [email or web address] 10/25/2023, 2:22 PM

## 2023-10-25 NOTE — Evaluation (Signed)
Occupational Therapy Evaluation Patient Details Name: [name] CERRITOS Sr. MRN: [id number] DOB: 06-19-1961 Today's Date: 10/25/2023   History of Present Illness Pt is 62 year old presented to [facility] on  10/23/23 for fall and hyperglycemia after running out of insulin. X-rays of lt knee negative.PMH - htn, cardiomyopathy, CHF, dm, esrd on hd, R BKA, cva   Clinical Impression   PTA, pt lived with his brother who he reports "does his own thing". Pt reports a friend has recently been stopping by and assisting with transfers and LB ADL; takes medical transport to doctor appts. Upon eval, pt with difficulty getting prosthetic leg to click into place limiting eval. Pt able to transfer with min A for STS and for lateral scoot. Pt needing mod A for LB ADL.  Presenting with decreased strength, sensation in LUE, balance, and activity tolerance. Patient will benefit from continued inpatient follow up therapy, <3 hours/day       If plan is discharge home, recommend the following: A little help with walking and/or transfers;A little help with bathing/dressing/bathroom;Assistance with cooking/housework;Assist for transportation;Help with stairs or ramp for entrance    Functional Status Assessment  Patient has had a recent decline in their functional status and demonstrates the ability to make significant improvements in function in a reasonable and predictable amount of time.  Equipment Recommendations  Other (comment) (defer)    Recommendations for Other Services       Precautions / Restrictions Precautions Precautions: Fall Restrictions Weight Bearing Restrictions: No      Mobility Bed Mobility Overal bed mobility: Needs Assistance Bed Mobility: Supine to Sit, Sit to Supine     Supine to sit: Min assist Sit to supine: Min assist   General bed mobility comments: to elevate trunk and then to reposition    Transfers Overall transfer level: Needs assistance Equipment used: Rolling walker (2  wheels) Transfers: Sit to/from Stand Sit to Stand: Min assist           General transfer comment: min A and cues for hand placement      Balance Overall balance assessment: Needs assistance Sitting-balance support: No upper extremity supported, Feet supported Sitting balance-Leahy Scale: Fair     Standing balance support: Bilateral upper extremity supported, During functional activity, Reliant on assistive device for balance Standing balance-Leahy Scale: Poor                             ADL either performed or assessed with clinical judgement   ADL Overall ADL's : Needs assistance/impaired Eating/Feeding: Set up;Sitting   Grooming: Set up;Sitting   Upper Body Bathing: Set up;Sitting   Lower Body Bathing: Minimal assistance;Sit to/from stand   Upper Body Dressing : Set up;Sitting   Lower Body Dressing: Moderate assistance;Sit to/from stand Lower Body Dressing Details (indicate cue type and reason): for donning L shoe and sock; able to don prosthetic without assist overall; but was unable to get it to lock in this date. Toilet Transfer: Minimal assistance;Rolling walker (2 wheels) Toilet Transfer Details (indicate cue type and reason): side steps up side of bed         Functional mobility during ADLs: Minimal assistance;Rolling walker (2 wheels)       Vision Patient Visual Report: No change from baseline Vision Assessment?: No apparent visual deficits     Perception         Praxis         Pertinent Vitals/Pain Pain Assessment  Pain Assessment: Faces Faces Pain Scale: Hurts a little bit Pain Location: lt knee Pain Descriptors / Indicators: Sore Pain Intervention(s): Limited activity within patient's tolerance, Monitored during session     Extremity/Trunk Assessment Upper Extremity Assessment Upper Extremity Assessment: Left hand dominant;LUE deficits/detail (writes L handed; per pt does some other tasks R handed) LUE Deficits / Details:  grip 3+/5; pt with numbness and decr dexterity   Lower Extremity Assessment Lower Extremity Assessment: Defer to PT evaluation       Communication Communication Communication: No apparent difficulties   Cognition Arousal: Alert Behavior During Therapy: WFL for tasks assessed/performed Overall Cognitive Status: Within Functional Limits for tasks assessed                                       General Comments  VSS on RA    Exercises     Shoulder Instructions      Home Living Family/patient expects to be discharged to:: Private residence Living Arrangements: Other relatives (brother) Available Help at Discharge: Family;Available PRN/intermittently Type of Home: House Home Access: Stairs to enter [employer] of Steps: 4 Entrance Stairs-Rails: Right;Left;Can reach both Home Layout: One level     Bathroom Shower/Tub: Producer, television/film/video: Standard     Home Equipment: Agricultural consultant (2 wheels);Wheelchair - manual (prosthetic)          Prior Functioning/Environment Prior Level of Function : History of Falls (last six months);Needs assist       Physical Assist : Mobility (physical) Mobility (physical): Stairs   Mobility Comments: Uses walker and prosthetic. ADLs Comments: Has been needing assist for stairs recently. friend helps to and from restroom and with donning L sock/shoe        OT Problem List: Decreased strength;Impaired balance (sitting and/or standing);Decreased activity tolerance;Decreased knowledge of use of DME or AE      OT Treatment/Interventions: Self-care/ADL training;Therapeutic exercise;DME and/or AE instruction;Balance training;Patient/family education;Therapeutic activities    OT Goals(Current goals can be found in the care plan section) Acute Rehab OT Goals Patient Stated Goal: go to rehab for a little while OT Goal Formulation: With patient Time For Goal Achievement: 11/08/23 Potential to Achieve  Goals: Good  OT Frequency: Min 1X/week    Co-evaluation              AM-PAC OT "6 Clicks" Daily Activity     Outcome Measure Help from another person eating meals?: None Help from another person taking care of personal grooming?: A Little Help from another person toileting, which includes using toliet, bedpan, or urinal?: A Lot Help from another person bathing (including washing, rinsing, drying)?: A Little Help from another person to put on and taking off regular upper body clothing?: A Little Help from another person to put on and taking off regular lower body clothing?: A Lot 6 Click Score: 17   End of Session Equipment Utilized During Treatment: Gait belt;Rolling walker (2 wheels);Other (comment) (RLE prosthetic) Nurse Communication: Mobility status  Activity Tolerance: Patient tolerated treatment well Patient left: in bed;with call bell/phone within reach;with bed alarm set  OT Visit Diagnosis: Unsteadiness on feet (R26.81);Muscle weakness (generalized) (M62.81);History of falling (Z91.81)                Time: 6213-0865 OT Time Calculation (min): 25 min Charges:  OT General Charges $OT Visit: 1 Visit OT Evaluation $OT Eval Moderate Complexity: 1 Mod OT  Treatments $Self Care/Home Management : 8-22 mins  Tyler Deis, OTR/L [facility] Acute Rehabilitation Office: [phone]   Myrla Halsted 10/25/2023, 3:39 PM

## 2023-10-25 NOTE — Plan of Care (Signed)
  Problem: Education: Goal: Ability to describe self-care measures that may prevent or decrease complications (Diabetes Survival Skills Education) will improve Outcome: Progressing Goal: Individualized Educational Video(s) Outcome: Progressing   Problem: Coping: Goal: Ability to adjust to condition or change in health will improve Outcome: Progressing   Problem: Fluid Volume: Goal: Ability to maintain a balanced intake and output will improve Outcome: Progressing   Problem: Health Behavior/Discharge Planning: Goal: Ability to identify and utilize available resources and services will improve Outcome: Progressing Goal: Ability to manage health-related needs will improve Outcome: Progressing   Problem: Metabolic: Goal: Ability to maintain appropriate glucose levels will improve Outcome: Progressing   Problem: Nutritional: Goal: Maintenance of adequate nutrition will improve Outcome: Progressing Goal: Progress toward achieving an optimal weight will improve Outcome: Progressing   Problem: Skin Integrity: Goal: Risk for impaired skin integrity will decrease Outcome: Progressing   Problem: Tissue Perfusion: Goal: Adequacy of tissue perfusion will improve Outcome: Progressing   Problem: Education: Goal: Knowledge of General Education information will improve Description: Including pain rating scale, medication(s)/side effects and non-pharmacologic comfort measures Outcome: Progressing   Problem: Health Behavior/Discharge Planning: Goal: Ability to manage health-related needs will improve Outcome: Progressing   Problem: Clinical Measurements: Goal: Ability to maintain clinical measurements within normal limits will improve Outcome: Progressing Goal: Will remain free from infection Outcome: Progressing Goal: Diagnostic test results will improve Outcome: Progressing Goal: Respiratory complications will improve Outcome: Progressing Goal: Cardiovascular complication will  be avoided Outcome: Progressing   Problem: Activity: Goal: Risk for activity intolerance will decrease Outcome: Progressing   Problem: Nutrition: Goal: Adequate nutrition will be maintained Outcome: Progressing   Problem: Coping: Goal: Level of anxiety will decrease Outcome: Progressing   Problem: Elimination: Goal: Will not experience complications related to bowel motility Outcome: Progressing Goal: Will not experience complications related to urinary retention Outcome: Progressing   Problem: Pain Management: Goal: General experience of comfort will improve Outcome: Progressing   Problem: Safety: Goal: Ability to remain free from injury will improve Outcome: Progressing

## 2023-10-25 NOTE — Progress Notes (Signed)
Patient ID: [name] Lance Sr., male   DOB: 1961-08-10, 62 y.o.   MRN: [id number] [facility] Progress Note   Assessment/ Plan:   1.  Hyperglycemia with hyperosmolality: Without evidence of diabetic ketoacidosis-thought to be due to nonadherence with insulin and was started on insulin drip upon admission.  Additional management per primary service. 2.  End-stage renal disease: Usually on hemodialysis on MWF schedule and will have dialysis ordered for tomorrow to resume his outpatient schedule. 3.  Left knee pain status post mechanical fall: Evaluation negative for osseous injury, symptomatic management. 4.  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  Resume VDR a with dialysis. 5.  Hypertension: Blood pressures at goal, continue to monitor for orthostasis. 6.  Elevated troponin with EKG changes: Management per TRH including echocardiogram.  Subjective:   Denies any acute events overnight, denies chest pain or shortness of breath.   Objective:   BP 115/81 (BP Location: Right Arm)   Pulse 74   Temp 98.2 F (36.8 C) (Oral)   Resp 20   Ht 6\' 1"  (1.854 m)   Wt 108.9 kg   SpO2 95%   BMI 31.66 kg/m   Physical Exam: Gen: Comfortably resting in bed, getting labs drawn CVS: Pulse regular rhythm, normal rate, S1 and S2 normal Resp: Clear to auscultation, no rales/rhonchi Abd: Soft, obese, nontender, bowel sounds normal Ext: Status post right BKA.  No edema left lower extremity.  Left upper arm aVF with palpable thrill  Labs: BMET Recent Labs  Lab 10/23/23 0009 10/23/23 1742 10/24/23 0135 10/24/23 0215 10/24/23 0608 10/24/23 0618 10/24/23 1655  NA 121* 128* 126* 124* 132* 131* 127*  K 6.0* 4.4 5.3* 4.8 4.1 4.2 4.9  CL 83* 87* 89* 85* 92*  --  89*  CO2 22 27  --  25 25  --  26  GLUCOSE 699* 472* 682* 685* 191*  --  202*  BUN 48* 24* 47* 34* 35*  --  39*  CREATININE 10.30* 6.75* 8.10* 7.87* 7.95*  --  7.99*  CALCIUM 9.5 9.2  --  9.5 9.3  --  10.1    CBC Recent Labs  Lab 10/22/23 2343 10/23/23 1742 10/24/23 0135 10/24/23 0618 10/25/23 1500  WBC 6.2 5.4  --   --  6.7  NEUTROABS  --  3.0  --   --  3.5  HGB 15.6 16.2 17.7* 15.6 14.6  HCT 47.9 48.6 52.0 46.0 44.7  MCV 90.9 89.5  --   --  88.9  PLT 202 161  --   --  180      Medications:     cinacalcet  30 mg Oral Daily   heparin injection (subcutaneous)  5,000 Units Subcutaneous Q8H   insulin aspart  0-5 Units Subcutaneous QHS   insulin aspart  0-9 Units Subcutaneous TID WC   insulin glargine-yfgn  25 Units Subcutaneous Daily   sevelamer carbonate  4,000 mg Oral TID WC   sodium chloride flush  10 mL Intravenous Q12H   Zetta Bills, MD 10/25/2023, 3:23 PM

## 2023-10-26 DIAGNOSIS — E1100 Type 2 diabetes mellitus with hyperosmolarity without nonketotic hyperglycemic-hyperosmolar coma (NKHHC): Secondary | ICD-10-CM | POA: Diagnosis not present

## 2023-10-26 DIAGNOSIS — E1165 Type 2 diabetes mellitus with hyperglycemia: Secondary | ICD-10-CM | POA: Diagnosis not present

## 2023-10-26 LAB — HEMOGLOBIN A1C
Hgb A1c MFr Bld: >15.5 % — ABNORMAL HIGH (ref 4.8–5.6)
Mean Plasma Glucose: >398 mg/dL

## 2023-10-26 LAB — CBC WITH DIFFERENTIAL/PLATELET
Abs Immature Granulocytes: 0.01 10*3/uL (ref 0.00–0.07)
Basophils Absolute: 0.1 10*3/uL (ref 0.0–0.1)
Basophils Relative: 1 %
Eosinophils Absolute: 0.3 10*3/uL (ref 0.0–0.5)
Eosinophils Relative: 6 %
HCT: 44.3 % (ref 39.0–52.0)
Hemoglobin: 14.1 g/dL (ref 13.0–17.0)
Immature Granulocytes: 0 %
Lymphocytes Relative: 33 %
Lymphs Abs: 1.9 10*3/uL (ref 0.7–4.0)
MCH: 29 pg (ref 26.0–34.0)
MCHC: 31.8 g/dL (ref 30.0–36.0)
MCV: 91.2 fL (ref 80.0–100.0)
Monocytes Absolute: 0.9 10*3/uL (ref 0.1–1.0)
Monocytes Relative: 16 %
Neutro Abs: 2.5 10*3/uL (ref 1.7–7.7)
Neutrophils Relative %: 44 %
Platelets: 161 10*3/uL (ref 150–400)
RBC: 4.86 MIL/uL (ref 4.22–5.81)
RDW: 14.7 % (ref 11.5–15.5)
WBC: 5.7 10*3/uL (ref 4.0–10.5)
nRBC: 0 % (ref 0.0–0.2)

## 2023-10-26 LAB — RENAL FUNCTION PANEL
Albumin: 2.8 g/dL — ABNORMAL LOW (ref 3.5–5.0)
Anion gap: 14 (ref 5–15)
BUN: 55 mg/dL — ABNORMAL HIGH (ref 8–23)
CO2: 23 mmol/L (ref 22–32)
Calcium: 9.8 mg/dL (ref 8.9–10.3)
Chloride: 89 mmol/L — ABNORMAL LOW (ref 98–111)
Creatinine, Ser: 10.98 mg/dL — ABNORMAL HIGH (ref 0.61–1.24)
GFR, Estimated: 5 mL/min — ABNORMAL LOW (ref >60)
Glucose, Bld: 260 mg/dL — ABNORMAL HIGH (ref 70–99)
Phosphorus: 6.6 mg/dL — ABNORMAL HIGH (ref 2.5–4.6)
Potassium: 4.9 mmol/L (ref 3.5–5.1)
Sodium: 126 mmol/L — ABNORMAL LOW (ref 135–145)

## 2023-10-26 LAB — GLUCOSE, CAPILLARY
Glucose-Capillary: 273 mg/dL — ABNORMAL HIGH (ref 70–99)
Glucose-Capillary: 263 mg/dL — ABNORMAL HIGH (ref 70–99)
Glucose-Capillary: 86 mg/dL (ref 70–99)
Glucose-Capillary: 293 mg/dL — ABNORMAL HIGH (ref 70–99)

## 2023-10-26 MED ORDER — INSULIN GLARGINE-YFGN 100 UNIT/ML ~~LOC~~ SOLN
30.0000 [IU] | Freq: Every day | SUBCUTANEOUS | Status: DC
Start: 2023-10-27 — End: 2023-10-28
  Administered 2023-10-27 – 2023-10-28 (×2): 30 [IU] via SUBCUTANEOUS
  Filled 2023-10-26 (×2): qty 0.3

## 2023-10-26 MED ORDER — LIDOCAINE-PRILOCAINE 2.5-2.5 % EX CREA: 1.0000 | TOPICAL_CREAM | CUTANEOUS | Status: DC | PRN

## 2023-10-26 MED ORDER — HEPARIN SODIUM (PORCINE) 1000 UNIT/ML DIALYSIS
7000.0000 [IU] | INTRAMUSCULAR | Status: DC | PRN
  Administered 2023-10-26: 7000 [IU] via INTRAVENOUS_CENTRAL
  Filled 2023-10-26: qty 7

## 2023-10-26 MED ORDER — PENTAFLUOROPROP-TETRAFLUOROETH EX AERO: 1.0000 | INHALATION_SPRAY | CUTANEOUS | Status: DC | PRN

## 2023-10-26 MED ORDER — INSULIN ASPART 100 UNIT/ML IJ SOLN
3.0000 [IU] | Freq: Three times a day (TID) | INTRAMUSCULAR | Status: DC
  Administered 2023-10-26 – 2023-10-28 (×5): 3 [IU] via SUBCUTANEOUS

## 2023-10-26 NOTE — Progress Notes (Addendum)
Received patient in be.Awake,alert and oriented x 3. He signed his treatment's consent.  Access used: Left upper arm AVF that worked well.  Duration of treatment: 3.5 hours.  Net uf: Achieved 2.5 L  Tolerated treatment: Yes.  Medicine given: Heparin 7,000 units pre-run dose.  Hand off to the patient's nurse:Back into his room with stable medical condition via transporter.

## 2023-10-26 NOTE — Plan of Care (Signed)

## 2023-10-26 NOTE — Progress Notes (Signed)
Mobility Specialist Progress Note:   10/26/23 1012  Pain Assessment  Pain Assessment Faces  Faces Pain Scale 0  Mobility  Activity Transferred to/from BSC  Level of Assistance Moderate assist, patient does 50-74%  Assistive Device Front wheel walker  Distance Ambulated (ft) 2 ft  Activity Response Tolerated fair  Mobility Referral Yes  Mobility visit 1 Mobility  Mobility Specialist Start Time (ACUTE ONLY) [id number]  Mobility Specialist Stop Time (ACUTE ONLY) 0948  Mobility Specialist Time Calculation (min) (ACUTE ONLY) 14 min    Pt received in bed, requesting assistance to [facility]. Pt having trouble donning prosthesis, though could not wait to have BM. Able to stand and pivot on L leg w/ mod/maxA. Pt left on BSC with call bell in hand. Staff aware.  [name] [name] Mobility Specialist Please contact via Special educational needs teacher or Rehab office at [phone]

## 2023-10-26 NOTE — Plan of Care (Signed)
  Problem: Fluid Volume: Goal: Ability to maintain a balanced intake and output will improve Outcome: Progressing   Problem: Nutritional: Goal: Maintenance of adequate nutrition will improve Outcome: Progressing   Problem: Skin Integrity: Goal: Risk for impaired skin integrity will decrease Outcome: Progressing   Problem: Clinical Measurements: Goal: Will remain free from infection Outcome: Progressing

## 2023-10-26 NOTE — Progress Notes (Signed)
PROGRESS NOTE  [name] Lance Sr. ZOX:[id number] DOB: Feb 14, 1961 DOA: 10/23/2023 PCP: Pcp, No  HPI/Recap of past 24 hours: [name] Lance Sr. is a 62 y.o. male with medical history significant of ESRD on dialysis MWF, CHF, DM, HTN, PVD status post right BKA, history of CVA, hypergammaglobulinemia, hyperferritinemia. Patient presented to the ED after a mechanical fall and complained of left hip, thigh, and knee pain.  Noted to be hyperglycemic (ran out of home insulin) with CBG >600 on subsequent checks but not in DKA (bicarb and anion gap normal).  Serum osmolarity 329.  He was started on insulin drip for HHS and was given >2 L IV fluids in the ED.  EKG showing new T wave inversions in inferior and lateral leads and ST elevation in aVL. Troponin rising, in the setting of ESRD. X-ray of left femur and pelvis negative for fractures.  Patient also complained of abdominal pain. CT renal stone study showing no acute findings.  TRH called to admit.    Today, patient denies any new complaints, denies any chest pain, worsening shortness of breath.     Assessment/Plan: Principal Problem:   Hyperosmolar hyperglycemic state (HHS) (HCC) Active Problems:   ESRD on dialysis (HCC)   HTN (hypertension)   CHF (congestive heart failure) (HCC)   Hyperglycemia   Abnormal EKG   Hyperglycemia/suspected HHS DM type 2, uncontrolled Last A1c 5.2 on 10/2022, repeat pending CBG >600 without signs of DKA (bicarb and anion gap normal), Serum osmolarity 329 S/p insulin drip for HHS SSI, Semglee, NovoLog 3 times daily, Accu-Cheks, hypoglycemic protocol  Elevated troponin In the setting of ESRD EKG showing new T wave inversions in inferior and lateral leads and ST elevation in aVL Initial troponin 84-->243-->558--> 296 ECHO with EF of 45 to 50%, global hypokinesis, grade 2 diastolic dysfunction, unchanged from previous echo done in 2023 Discussed extensively with cardiology Dr. Wyline Mood on 12/9, no further workup  recommended from a cardiology standpoint Patient remains currently asymptomatic, but high risk  ESRD on dialysis MWF Hyponatremia Monitor for signs of volume overload Nephrology for HD   Chronic systolic HF Last echo done in March 2023 showing EF 45 to 50%, RV systolic function mildly reduced, mild mitral valve regurgitation, mild aortic valve stenosis Volume management with dialysis   QT prolongation QTc 526 on EKG Monitor potassium and magnesium levels, avoid QT prolonging drugs   Generalized weakness/fall at home Left knee pain Left knee range of motion limited Left knee x-ray, no evidence of fracture dislocation or joint effusion PT/OT eval, fall precautions.   Hypertension Currently normotensive  History of CVA PVD Right BKA  Obesity Lifestyle modification advised     Estimated body mass index is 32.87 kg/m as calculated from the following:   Height as of this encounter: 6\' 1"  (1.854 m).   Weight as of this encounter: 113 kg.     Code Status: Full  Family Communication: None at bedside  Disposition Plan: Status is: Observation The patient will require care spanning > 2 midnights and should be moved to inpatient because: Level of care      Consultants: Nephrology  Procedures: None  Antimicrobials: None  DVT prophylaxis: Heparin Abeytas   Objective: Vitals:   10/26/23 1230 10/26/23 1300 10/26/23 1330 10/26/23 1400  BP: 107/83 109/63 108/62 112/67  Pulse: 75 78 77 75  Resp: 16 19 13 14   Temp:      TempSrc:      SpO2: 99% 98% 98% 98%  Weight:  Height:        Intake/Output Summary (Last 24 hours) at 10/26/2023 1425 Last data filed at 10/25/2023 2011 Gross per 24 hour  Intake 477 ml  Output --  Net 477 ml   Filed Weights   10/24/23 0604 10/26/23 1155  Weight: 108.9 kg 113 kg    Exam: General: NAD, chronically ill-appearing Cardiovascular: S1, S2 present Respiratory: CTAB Abdomen: Soft, nontender, nondistended, bowel sounds  present Musculoskeletal: No bilateral pedal edema noted, right BKA, left upper arm AV fistula Skin: Normal Psychiatry: Normal mood     Data Reviewed: CBC: Recent Labs  Lab 10/22/23 2343 10/23/23 1742 10/24/23 0135 10/24/23 0618 10/25/23 1500 10/26/23 0214  WBC 6.2 5.4  --   --  6.7 5.7  NEUTROABS  --  3.0  --   --  3.5 2.5  HGB 15.6 16.2 17.7* 15.6 14.6 14.1  HCT 47.9 48.6 52.0 46.0 44.7 44.3  MCV 90.9 89.5  --   --  88.9 91.2  PLT 202 161  --   --  180 161   Basic Metabolic Panel: Recent Labs  Lab 10/24/23 0215 10/24/23 0608 10/24/23 0618 10/24/23 1655 10/25/23 1500 10/26/23 0214  NA 124* 132* 131* 127* 126* 126*  K 4.8 4.1 4.2 4.9 5.2* 4.9  CL 85* 92*  --  89* 88* 89*  CO2 25 25  --  26 23 23   GLUCOSE 685* 191*  --  202* 275* 260*  BUN 34* 35*  --  39* 49* 55*  CREATININE 7.87* 7.95*  --  7.99* 10.15* 10.98*  CALCIUM 9.5 9.3  --  10.1 10.0 9.8  MG  --  1.9  --   --  2.2  --   PHOS  --   --   --   --  6.3* 6.6*   GFR: Estimated Creatinine Clearance: 9.3 mL/min (A) (by C-G formula based on SCr of 10.98 mg/dL (H)). Liver Function Tests: Recent Labs  Lab 10/23/23 0009 10/23/23 1742 10/25/23 1500 10/26/23 0214  AST 40 29  --   --   ALT 32 29  --   --   ALKPHOS 141* 152*  --   --   BILITOT 0.8 0.6  --   --   PROT 7.9 8.2*  --   --   ALBUMIN 3.1* 3.1* 2.9* 2.8*   No results for input(s): "LIPASE", "AMYLASE" in the last 168 hours. No results for input(s): "AMMONIA" in the last 168 hours. Coagulation Profile: No results for input(s): "INR", "PROTIME" in the last 168 hours. Cardiac Enzymes: No results for input(s): "CKTOTAL", "CKMB", "CKMBINDEX", "TROPONINI" in the last 168 hours. BNP (last 3 results) No results for input(s): "PROBNP" in the last 8760 hours. HbA1C: No results for input(s): "HGBA1C" in the last 72 hours. CBG: Recent Labs  Lab 10/25/23 1154 10/25/23 1718 10/25/23 2117 10/26/23 0617 10/26/23 1110  GLUCAP 304* 238* 300* 273* 263*    Lipid Profile: No results for input(s): "CHOL", "HDL", "LDLCALC", "TRIG", "CHOLHDL", "LDLDIRECT" in the last 72 hours. Thyroid Function Tests: No results for input(s): "TSH", "T4TOTAL", "FREET4", "T3FREE", "THYROIDAB" in the last 72 hours. Anemia Panel: No results for input(s): "VITAMINB12", "FOLATE", "FERRITIN", "TIBC", "IRON", "RETICCTPCT" in the last 72 hours. Urine analysis:    Component Value Date/Time   COLORURINE AMBER (A) 07/01/2022 1143   APPEARANCEUR CLEAR 07/01/2022 1143   LABSPEC 1.017 07/01/2022 1143   PHURINE 7.0 07/01/2022 1143   GLUCOSEU NEGATIVE 07/01/2022 1143   HGBUR NEGATIVE 07/01/2022 1143  BILIRUBINUR NEGATIVE 07/01/2022 1143   KETONESUR NEGATIVE 07/01/2022 1143   PROTEINUR >=300 (A) 07/01/2022 1143   NITRITE NEGATIVE 07/01/2022 1143   LEUKOCYTESUR NEGATIVE 07/01/2022 1143   Sepsis Labs: @LABRCNTIP (procalcitonin:4,lacticidven:4)  )No results found for this or any previous visit (from the past 240 hour(s)).    Studies: No results found.  Scheduled Meds:  Chlorhexidine Gluconate Cloth  6 each Topical Q0600   cinacalcet  30 mg Oral Daily   heparin injection (subcutaneous)  5,000 Units Subcutaneous Q8H   insulin aspart  0-5 Units Subcutaneous QHS   insulin aspart  0-9 Units Subcutaneous TID WC   insulin aspart  3 Units Subcutaneous TID WC   [START ON 10/27/2023] insulin glargine-yfgn  30 Units Subcutaneous Daily   sevelamer carbonate  4,000 mg Oral TID WC   sodium chloride flush  10 mL Intravenous Q12H    Continuous Infusions:   LOS: 0 days     Briant Cedar, MD [facility]  If 7PM-7AM, please contact night-coverage [email or web address] 10/26/2023, 2:25 PM

## 2023-10-26 NOTE — Progress Notes (Signed)
Physical Therapy Treatment Patient Details Name: [name] CHAUHAN Sr. MRN: [id number] DOB: 1961/04/03 Today's Date: 10/26/2023   History of Present Illness Pt is 62 year old presented to [facility] on  10/23/23 for fall and hyperglycemia after running out of insulin. X-rays of lt knee negative.PMH - htn, cardiomyopathy, CHF, dm, esrd on hd, R BKA, cva    PT Comments  Patient fatigued with transfers to [facility] then had A for hygiene and for transfer to bed then recliner.  R prosthetic not fitting so called to get shrinker to improve fit.  Patient needing increased assistance for transfers compared to last sessions and not initiating to don prosthetic though even with assistance unable to fit.  PT will continue to follow.  He remains most appropriate for inpatient rehab (<3 hours/day).     If plan is discharge home, recommend the following: A little help with walking and/or transfers;Assist for transportation;Help with stairs or ramp for entrance;Assistance with cooking/housework;A little help with bathing/dressing/bathroom   Can travel by private vehicle        Equipment Recommendations  None recommended by PT    Recommendations for Other Services       Precautions / Restrictions Precautions Precautions: Fall Required Braces or Orthoses: Other Brace Other Brace: R prosthetic (not fitting)     Mobility  Bed Mobility               General bed mobility comments: up on BSC    Transfers Overall transfer level: Needs assistance Equipment used: Rolling walker (2 wheels) Transfers: Sit to/from Stand, Bed to chair/wheelchair/BSC Sit to Stand: Max assist Stand pivot transfers: Mod assist, +2 safety/equipment        Lateral/Scoot Transfers: Mod assist, +2 safety/equipment General transfer comment: up to [facility] with mobility specialist.  Assisted to stand flexed and +2 A for toilet hygiene, pivot to bed with mod A of 2 for safety then to recliner via lateral scoot and mod A cues for technique +2  for safety with chair proximity and set up    Ambulation/Gait               General Gait Details: unable   Stairs             Wheelchair Mobility     Tilt Bed    Modified Rankin (Stroke Patients Only)       Balance Overall balance assessment: Needs assistance   Sitting balance-Leahy Scale: Fair     Standing balance support: Bilateral upper extremity supported Standing balance-Leahy Scale: Poor Standing balance comment: mod A and UE support for standing with R prosthetic poor fitting and limited standing tolerance during toilet hygiene                            Cognition Arousal: Alert Behavior During Therapy: Flat affect Overall Cognitive Status: Within Functional Limits for tasks assessed                                          Exercises Other Exercises Other Exercises: encouraged armchair pushups for UE strength, pt performed x 5 seated in recliner    General Comments General comments (skin integrity, edema, etc.): on RA VSS; patient wearing prosthetic liner for helping to manage edema, called ortho tech to get shrinker for pt.      Pertinent Vitals/Pain Pain Assessment  Pain Assessment: Faces Faces Pain Scale: No hurt    Home Living                          Prior Function            PT Goals (current goals can now be found in the care plan section) Progress towards PT goals: Not progressing toward goals - comment    Frequency    Min 1X/week      PT Plan      Co-evaluation              AM-PAC PT "6 Clicks" Mobility   Outcome Measure  Help needed turning from your back to your side while in a flat bed without using bedrails?: A Little Help needed moving from lying on your back to sitting on the side of a flat bed without using bedrails?: A Lot Help needed moving to and from a bed to a chair (including a wheelchair)?: Total Help needed standing up from a chair using your arms (e.g.,  wheelchair or bedside chair)?: Total Help needed to walk in hospital room?: Total Help needed climbing 3-5 steps with a railing? : Total 6 Click Score: 9    End of Session Equipment Utilized During Treatment: Gait belt;Other (comment) (R LE prosthetic) Activity Tolerance: Patient limited by fatigue Patient left: in chair;with call bell/phone within reach   PT Visit Diagnosis: Unsteadiness on feet (R26.81);Other abnormalities of gait and mobility (R26.89);Muscle weakness (generalized) (M62.81);History of falling (Z91.81)     Time: 0945-1000 PT Time Calculation (min) (ACUTE ONLY): 15 min  Charges:    $Therapeutic Activity: 8-22 mins PT General Charges $$ ACUTE PT VISIT: 1 Visit                     Sheran Lawless, PT Acute Rehabilitation Services Office:[phone] 10/26/2023    [name] [name] 10/26/2023, 10:18 AM

## 2023-10-26 NOTE — Inpatient Diabetes Management (Signed)
Inpatient Diabetes Program Recommendations  AACE/ADA: New Consensus Statement on Inpatient Glycemic Control   Target Ranges:  Prepandial:   less than 140 mg/dL      Peak postprandial:   less than 180 mg/dL (1-2 hours)      Critically ill patients:  140 - 180 mg/dL    Latest Reference Range & Units 10/25/23 05:54 10/25/23 11:54 10/25/23 17:18 10/25/23 21:17 10/26/23 06:17 10/26/23 11:10  Glucose-Capillary 70 - 99 mg/dL 578 (H) 469 (H) 629 (H) 300 (H) 273 (H) 263 (H)   Review of Glycemic Control  Diabetes history: DM2 Outpatient Diabetes medications: Lantus 25 units at bedtime (not taking) Current orders for Inpatient glycemic control: Semglee 25 units daily, Novolog 0-9 units TID with meals, Novolog 0-5 units QHS  Inpatient Diabetes Program Recommendations:    Insulin: Please consider increasing Semglee to 30 units daily and adding Novolog 3 units TID with meals for meal coverage if patient eats at least 50% of meals.  Thanks, Orlando Penner, RN, MSN, CDCES Diabetes Coordinator Inpatient Diabetes Program [phone] (Team Pager from 8am to 5pm)

## 2023-10-26 NOTE — Progress Notes (Signed)
Patient ID: [name] Lance Sr., male   DOB: 09-05-1961, 62 y.o.   MRN: [id number] [facility] Progress Note   Assessment/ Plan:   1.  Hyperglycemia with hyperosmolality: Without evidence of diabetic ketoacidosis-thought to be due to nonadherence with insulin and was started on insulin drip upon admission.  Additional management per primary service. 2.  End-stage renal disease: on HD MWF. Plan for HD today.  3.  Left knee pain status post mechanical fall: Evaluation negative for osseous injury, symptomatic management. 4.  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  Resume VDR a with dialysis. 5.  Hypertension: Blood pressures at goal, continue to monitor for orthostasis. 6.  Elevated troponin with EKG changes: Management per TRH including echocardiogram.  Subjective:   Denies any acute events overnight, denies chest pain or shortness of breath.   Objective:   BP 120/74 (BP Location: Right Arm)   Pulse 76   Temp 98.4 F (36.9 C) (Oral)   Resp (!) 23   Ht 6\' 1"  (1.854 m)   Wt 113 kg   SpO2 97%   BMI 32.87 kg/m   Physical Exam: Gen: Comfortably resting in bed, getting labs drawn CVS: Pulse regular rhythm, normal rate, S1 and S2 normal Resp: Clear to auscultation, no rales/rhonchi Abd: Soft, obese, nontender, bowel sounds normal Ext: Status post right BKA.  No edema left lower extremity.  Left upper arm aVF with palpable thrill  Labs: BMET Recent Labs  Lab 10/23/23 0009 10/23/23 1742 10/24/23 0135 10/24/23 0215 10/24/23 0608 10/24/23 0618 10/24/23 1655 10/25/23 1500 10/26/23 0214  NA 121* 128* 126* 124* 132* 131* 127* 126* 126*  K 6.0* 4.4 5.3* 4.8 4.1 4.2 4.9 5.2* 4.9  CL 83* 87* 89* 85* 92*  --  89* 88* 89*  CO2 22 27  --  25 25  --  26 23 23   GLUCOSE 699* 472* 682* 685* 191*  --  202* 275* 260*  BUN 48* 24* 47* 34* 35*  --  39* 49* 55*  CREATININE 10.30* 6.75* 8.10* 7.87* 7.95*  --  7.99* 10.15* 10.98*  CALCIUM 9.5 9.2  --  9.5 9.3  --   10.1 10.0 9.8  PHOS  --   --   --   --   --   --   --  6.3* 6.6*   CBC Recent Labs  Lab 10/22/23 2343 10/23/23 1742 10/24/23 0135 10/24/23 0618 10/25/23 1500 10/26/23 0214  WBC 6.2 5.4  --   --  6.7 5.7  NEUTROABS  --  3.0  --   --  3.5 2.5  HGB 15.6 16.2 17.7* 15.6 14.6 14.1  HCT 47.9 48.6 52.0 46.0 44.7 44.3  MCV 90.9 89.5  --   --  88.9 91.2  PLT 202 161  --   --  180 161      Medications:     Chlorhexidine Gluconate Cloth  6 each Topical Q0600   cinacalcet  30 mg Oral Daily   heparin injection (subcutaneous)  5,000 Units Subcutaneous Q8H   insulin aspart  0-5 Units Subcutaneous QHS   insulin aspart  0-9 Units Subcutaneous TID WC   insulin glargine-yfgn  25 Units Subcutaneous Daily   sevelamer carbonate  4,000 mg Oral TID WC   sodium chloride flush  10 mL Intravenous Q12H   Vinson Moselle  MD  [facility] 10/26/2023, 12:13 PM

## 2023-10-27 DIAGNOSIS — E1165 Type 2 diabetes mellitus with hyperglycemia: Secondary | ICD-10-CM | POA: Diagnosis not present

## 2023-10-27 DIAGNOSIS — E1122 Type 2 diabetes mellitus with diabetic chronic kidney disease: Secondary | ICD-10-CM | POA: Diagnosis not present

## 2023-10-27 DIAGNOSIS — E1100 Type 2 diabetes mellitus with hyperosmolarity without nonketotic hyperglycemic-hyperosmolar coma (NKHHC): Secondary | ICD-10-CM | POA: Diagnosis not present

## 2023-10-27 DIAGNOSIS — N186 End stage renal disease: Secondary | ICD-10-CM | POA: Diagnosis not present

## 2023-10-27 DIAGNOSIS — I132 Hypertensive heart and chronic kidney disease with heart failure and with stage 5 chronic kidney disease, or end stage renal disease: Secondary | ICD-10-CM | POA: Diagnosis not present

## 2023-10-27 LAB — CBC WITH DIFFERENTIAL/PLATELET
Abs Immature Granulocytes: 0.02 10*3/uL (ref 0.00–0.07)
Basophils Absolute: 0.1 10*3/uL (ref 0.0–0.1)
Basophils Relative: 1 %
Eosinophils Absolute: 0.3 10*3/uL (ref 0.0–0.5)
Eosinophils Relative: 4 %
HCT: 41 % (ref 39.0–52.0)
Hemoglobin: 13.4 g/dL (ref 13.0–17.0)
Immature Granulocytes: 0 %
Lymphocytes Relative: 27 %
Lymphs Abs: 1.7 10*3/uL (ref 0.7–4.0)
MCH: 29.1 pg (ref 26.0–34.0)
MCHC: 32.7 g/dL (ref 30.0–36.0)
MCV: 89.1 fL (ref 80.0–100.0)
Monocytes Absolute: 1.2 10*3/uL — ABNORMAL HIGH (ref 0.1–1.0)
Monocytes Relative: 18 %
Neutro Abs: 3.3 10*3/uL (ref 1.7–7.7)
Neutrophils Relative %: 50 %
Platelets: 180 10*3/uL (ref 150–400)
RBC: 4.6 MIL/uL (ref 4.22–5.81)
RDW: 14.6 % (ref 11.5–15.5)
WBC: 6.6 10*3/uL (ref 4.0–10.5)
nRBC: 0 % (ref 0.0–0.2)

## 2023-10-27 LAB — GLUCOSE, CAPILLARY
Glucose-Capillary: 257 mg/dL — ABNORMAL HIGH (ref 70–99)
Glucose-Capillary: 178 mg/dL — ABNORMAL HIGH (ref 70–99)
Glucose-Capillary: 223 mg/dL — ABNORMAL HIGH (ref 70–99)
Glucose-Capillary: 156 mg/dL — ABNORMAL HIGH (ref 70–99)

## 2023-10-27 LAB — RENAL FUNCTION PANEL
Albumin: 2.7 g/dL — ABNORMAL LOW (ref 3.5–5.0)
Anion gap: 14 (ref 5–15)
BUN: 36 mg/dL — ABNORMAL HIGH (ref 8–23)
CO2: 24 mmol/L (ref 22–32)
Calcium: 9.2 mg/dL (ref 8.9–10.3)
Chloride: 90 mmol/L — ABNORMAL LOW (ref 98–111)
Creatinine, Ser: 8.37 mg/dL — ABNORMAL HIGH (ref 0.61–1.24)
GFR, Estimated: 7 mL/min — ABNORMAL LOW (ref >60)
Glucose, Bld: 264 mg/dL — ABNORMAL HIGH (ref 70–99)
Phosphorus: 5.2 mg/dL — ABNORMAL HIGH (ref 2.5–4.6)
Potassium: 4.5 mmol/L (ref 3.5–5.1)
Sodium: 128 mmol/L — ABNORMAL LOW (ref 135–145)

## 2023-10-27 LAB — HEPATITIS B SURFACE ANTIBODY, QUANTITATIVE: Hep B S AB Quant (Post): 190 m[IU]/mL

## 2023-10-27 MED ORDER — CHLORHEXIDINE GLUCONATE CLOTH 2 % EX PADS
6.0000 | MEDICATED_PAD | Freq: Every day | CUTANEOUS | Status: DC
Start: 2023-10-28 — End: 2023-10-28

## 2023-10-27 MED ORDER — NOVOLIN 70/30 FLEXPEN (70-30) 100 UNIT/ML ~~LOC~~ SUPN
PEN_INJECTOR | SUBCUTANEOUS | 3 refills | Status: DC
  Filled 2023-10-27: qty 15, 27d supply, fill #0

## 2023-10-27 NOTE — TOC Progression Note (Signed)
Transition of Care (TOC) - Progression Note    Patient Details  Name: [name] ROOKE Sr. MRN: [id number] Date of Birth: November 17, 1961  Transition of Care [facility]) CM/SW Contact  Leone Haven, RN Phone Number: 10/27/2023, 4:41 PM  Clinical Narrative:    Plan for dc tomorrow, he has transportation with [employer] [place]) [phone], he will be here tomorrow to transport patient to HD , will be here at 10 am, he will bring a w/chair.  Patient has HD on MWF.  Also NCM scanned PCS forms to [email or web address] on 12/10, he is set up with [facility] for HHPT, HHOT, will need orders. Also [employer] will deliver a drop arm BSC to his home. Referral made to [facility]. The rep with [employer] came by yesterday to help him fit his prosthetic better. Patient has a follow up apt on AVS with [facility] but would prefer for the doctor to make house call,  he has seen  Alcide Clever in the past at home, if can get an apt with her then will need to cancel the [facility] apt.    Patients cell phone is [phone].    Expected Discharge Plan: Home w Home Health Services Barriers to Discharge: Continued Medical Work up  Expected Discharge Plan and Services In-house Referral: Clinical Social Work Discharge Planning Services: CM Consult Post Acute Care Choice: Home Health Living arrangements for the past 2 months: Single Family Home                 DME Arranged: N/A DME Agency: NA       HH Arranged: NA           Social Determinants of Health (SDOH) Interventions SDOH Screenings   Food Insecurity: No Food Insecurity (10/24/2023)  Housing: Low Risk  (10/24/2023)  Transportation Needs: No Transportation Needs (10/24/2023)  Utilities: Not At Risk (10/24/2023)  Alcohol Screen: Low Risk  (02/04/2022)  Depression (PHQ2-9): Low Risk  (08/06/2022)  Tobacco Use: Low Risk  (10/24/2023)    Readmission Risk Interventions    11/05/2022    3:08 PM  Readmission Risk Prevention Plan  Transportation  Screening Complete  Medication Review (RN Care Manager) Complete  PCP or Specialist appointment within 3-5 days of discharge Complete  HRI or Home Care Consult Complete  SW Recovery Care/Counseling Consult Complete  Palliative Care Screening Complete  Skilled Nursing Facility Complete

## 2023-10-27 NOTE — Progress Notes (Signed)
Mobility Specialist Progress Note:   10/27/23 1000  Mobility  Activity Transferred from bed to chair  Level of Assistance Moderate assist, patient does 50-74%  Assistive Device Sliding board;Front wheel walker  Distance Ambulated (ft) 2 ft  Activity Response Tolerated fair  Mobility Referral Yes  Mobility visit 1 Mobility  Mobility Specialist Start Time (ACUTE ONLY) [id number]  Mobility Specialist Stop Time (ACUTE ONLY) [id number]  Mobility Specialist Time Calculation (min) (ACUTE ONLY) 16 min    Pt received in bed, agreeable to mobility. Initially attempting to transfer to chair via lat scoot using slide board. Pt then wanted to attempt donning prosthesis. Pt able to briefly stand and pivot to chair with RW and modA. Prosthesis still unable to be put on. Pt found to have had some BM in bed. NT notified. Pt left in chair with call bell and all needs met.  D'Vante Earlene Plater Mobility Specialist Please contact via Special educational needs teacher or Rehab office at [phone]

## 2023-10-27 NOTE — TOC Initial Note (Addendum)
Transition of Care (TOC) - Initial/Assessment Note    Patient Details  Name: [name] LIZANO Sr. MRN: [id number] Date of Birth: [birth year]  Transition of Care [facility]) CM/SW Contact:    Leone Haven, RN Phone Number: 10/27/2023, 12:11 PM  Clinical Narrative:                 From home with brother who works third shift, sleeps during the day, has no PCP - connected with [facility] for hospital follow up apt, but has insurance on file, states has no HH services in place at this time, has a w/chair and a walker at home, he has a prosthetic which does not fit well.   States he will need assist with transportation at dc and he has no support system even though his brother lives with him,  states gets medications from [name]..  Pta R BKA has prosthetic, w/chair and walker. Per doctor will get physical therapy to reevaluate before discharge. Patient has no preference for [facility] agency.  NCM made referral to [facility] for HHPT, HHOT.  Soc will begin 24 to 48 hrs post dc.  NCM also scanned expedited PCS form to [email or web address] at 4:08 pm, to help patient get some additional assistance at home. Also the rep from [employer] is coming to help fit the prosthectic for patient today or tomorrow.    Expected Discharge Plan: Home w Home Health Services Barriers to Discharge: Continued Medical Work up   Patient Goals and CMS Choice Patient states their goals for this hospitalization and ongoing recovery are:: wants to go to SNF CMS Medicare.gov Compare Post Acute Care list provided to:: Patient Choice offered to / list presented to : NA      Expected Discharge Plan and Services In-house Referral: Clinical Social Work Discharge Planning Services: CM Consult Post Acute Care Choice: Home Health Living arrangements for the past 2 months: Single Family Home                 DME Arranged: N/A DME Agency: NA       HH Arranged: NA          Prior Living  Arrangements/Services Living arrangements for the past 2 months: Single Family Home Lives with:: Self, Siblings (brother) Patient language and need for interpreter reviewed:: Yes Do you feel safe going back to the place where you live?: Yes      Need for Family Participation in Patient Care: Yes (Comment) Care giver support system in place?: No (comment) Current home services: DME (w/chair, ,walker) Criminal Activity/Legal Involvement Pertinent to Current Situation/Hospitalization: No - Comment as needed  Activities of Daily Living   ADL Screening (condition at time of admission) Independently performs ADLs?: Yes (appropriate for developmental age) Is the patient deaf or have difficulty hearing?: No Does the patient have difficulty seeing, even when wearing glasses/contacts?: No Does the patient have difficulty concentrating, remembering, or making decisions?: No  Permission Sought/Granted Permission sought to share information with : Case Manager Permission granted to share information with : Yes, Verbal Permission Granted  Share Information with NAME: [name] [name]     Permission granted to share info w Relationship: Brother  Permission granted to share info w Contact Information: [phone]  Emotional Assessment Appearance:: Appears stated age Attitude/Demeanor/Rapport: Engaged Affect (typically observed): Apprehensive Orientation: : Oriented to Self, Oriented to Place, Oriented to  Time, Oriented to Situation Alcohol / Substance Use: Not Applicable Psych Involvement: No (comment)  Admission diagnosis:  Hyperglycemia [R73.9] Fall, initial  encounter [id number]] Patient Active Problem List   Diagnosis Date Noted   Hyperglycemia 10/24/2023   Abnormal EKG 10/24/2023   RUQ abdominal pain 10/30/2022   Cholelithiasis 10/29/2022   Hyperbilirubinemia 10/29/2022   Volume overload 10/19/2022   CHF (congestive heart failure) (HCC) 10/18/2022   Hyperosmolar hyperglycemic state (HHS)  (HCC)    Diarrhea    Dehiscence of amputation stump of right lower extremity (HCC)    Abscess of right lower leg    Pressure injury of skin 07/02/2022   Cellulitis of right leg 07/01/2022   CAD (coronary artery disease) 05/24/2022   Leukocytosis 05/24/2022   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/23/2022   Obesity (BMI 30-39.9) 02/19/2022   History of CVA  02/05/2022   Carotid stenosis 02/05/2022   Hyperkalemia 02/03/2022   acute on chronic Anemia of chronic renal failure  02/03/2022   Subacute osteomyelitis, right ankle and foot (HCC) 02/02/2022   Acute metabolic encephalopathy 02/02/2022   Protein-calorie malnutrition, mild (HCC) 02/02/2022   GERD (gastroesophageal reflux disease) 02/02/2022   Acute on chronic systolic CHF (congestive heart failure) (HCC) 08/14/2019   LBBB (left bundle branch block) 08/13/2019   Problem with vascular access 08/13/2019   Dialysis AV fistula malfunction (HCC) 08/12/2019   ESRD on dialysis (HCC) 09/20/2018   DM2 (diabetes mellitus, type 2) (HCC) 09/20/2018   HTN (hypertension) 09/20/2018   PCP:  Pcp, No Pharmacy:   [facility] [place], [place] - [address] [place] [address] Phone: [phone] Fax: [phone]  [facility] [place], [place] - [address] [place] [address] Phone: [phone] Fax: [phone]     Social Determinants of Health (SDOH) Social History: SDOH Screenings   Food Insecurity: No Food Insecurity (10/24/2023)  Housing: Low Risk  (10/24/2023)  Transportation Needs: No Transportation Needs (10/24/2023)  Utilities: Not At Risk (10/24/2023)  Alcohol Screen: Low Risk  (02/04/2022)  Depression (PHQ2-9): Low Risk  (08/06/2022)  Tobacco Use: Low Risk  (10/24/2023)   SDOH Interventions:     Readmission Risk Interventions    11/05/2022    3:08 PM  Readmission Risk Prevention Plan  Transportation Screening Complete   Medication Review (RN Care Manager) Complete  PCP or Specialist appointment within 3-5 days of discharge Complete  HRI or Home Care Consult Complete  SW Recovery Care/Counseling Consult Complete  Palliative Care Screening Complete  Skilled Nursing Facility Complete

## 2023-10-27 NOTE — Plan of Care (Signed)
  Problem: Education: Goal: Ability to describe self-care measures that may prevent or decrease complications (Diabetes Survival Skills Education) will improve Outcome: Progressing Goal: Individualized Educational Video(s) Outcome: Progressing   Problem: Coping: Goal: Ability to adjust to condition or change in health will improve Outcome: Progressing   Problem: Fluid Volume: Goal: Ability to maintain a balanced intake and output will improve Outcome: Progressing   Problem: Health Behavior/Discharge Planning: Goal: Ability to identify and utilize available resources and services will improve Outcome: Progressing   Problem: Metabolic: Goal: Ability to maintain appropriate glucose levels will improve Outcome: Progressing   Problem: Nutritional: Goal: Maintenance of adequate nutrition will improve Outcome: Progressing Goal: Progress toward achieving an optimal weight will improve Outcome: Progressing   Problem: Skin Integrity: Goal: Risk for impaired skin integrity will decrease Outcome: Progressing   Problem: Tissue Perfusion: Goal: Adequacy of tissue perfusion will improve Outcome: Progressing   Problem: Education: Goal: Knowledge of General Education information will improve Description: Including pain rating scale, medication(s)/side effects and non-pharmacologic comfort measures Outcome: Progressing   Problem: Health Behavior/Discharge Planning: Goal: Ability to manage health-related needs will improve Outcome: Progressing   Problem: Clinical Measurements: Goal: Ability to maintain clinical measurements within normal limits will improve Outcome: Progressing Goal: Will remain free from infection Outcome: Progressing Goal: Diagnostic test results will improve Outcome: Progressing Goal: Respiratory complications will improve Outcome: Progressing Goal: Cardiovascular complication will be avoided Outcome: Progressing   Problem: Activity: Goal: Risk for activity  intolerance will decrease Outcome: Progressing   Problem: Nutrition: Goal: Adequate nutrition will be maintained Outcome: Progressing   Problem: Coping: Goal: Level of anxiety will decrease Outcome: Progressing   Problem: Elimination: Goal: Will not experience complications related to bowel motility Outcome: Progressing Goal: Will not experience complications related to urinary retention Outcome: Progressing   Problem: Pain Management: Goal: General experience of comfort will improve Outcome: Progressing   Problem: Safety: Goal: Ability to remain free from injury will improve Outcome: Progressing

## 2023-10-27 NOTE — Progress Notes (Addendum)
Occupational Therapy Treatment Patient Details Name: [name] [name] Sr. MRN: [id number] DOB: [birth year] Today's Date: 10/27/2023   History of present illness Pt is 62 year old presented to [facility] on  10/23/23 for fall and hyperglycemia after running out of insulin. X-rays of lt knee negative.PMH - htn, cardiomyopathy, CHF, dm, esrd on hd, R BKA, cva   OT comments  Pt agreeable to OT/PT session. Pt remains unable to don his RLE prosthesis in safe manner requiring +2 A and standing into prosthesis to don today. Pt reports he always dons RLE and does not scoot at baseline. Pt introduced to lateral scoot method to optimize safe options for transfers. Pt needing min A and cues initially to optimize technique. Pt does have limited assist at home, but has a few friends that can assist him on dialysis days. PT contacted [facility] to inquire about prosthesis and they are able to look at it today. Pt encouraged to use lateral scoot transfer in home setting for safety. OT continues to believe inpatient rehab <3 hours/day is safest option for this pt's discharge, but if home will need a bariatric drop arm BSC to accommodate for surface needed for scooting safely as well as maximized [facility] services.        If plan is discharge home, recommend the following:  Assistance with cooking/housework;Assist for transportation;Help with stairs or ramp for entrance;Two people to help with walking and/or transfers;A lot of help with bathing/dressing/bathroom   Equipment Recommendations  Other (comment) (drop arm BSC)    Recommendations for Other Services      Precautions / Restrictions Precautions Precautions: Fall Other Brace: R prosthetic (not fitting) Restrictions Weight Bearing Restrictions: No       Mobility Bed Mobility Overal bed mobility: Needs Assistance Bed Mobility: Sit to Supine, Rolling Rolling: Supervision     Sit to supine: Contact guard assist        Transfers Overall transfer level: Needs  assistance Equipment used: Rolling walker (2 wheels) Transfers: Sit to/from Stand, Bed to chair/wheelchair/BSC Sit to Stand: Mod assist, +2 physical assistance          Lateral/Scoot Transfers: Min assist General transfer comment: significant assistance to power up into standing in an effort to achieve locking of RLE prosthetic. Pt later laterally scoots from drop arm recliner to bed with minA for safety     Balance Overall balance assessment: Needs assistance Sitting-balance support: No upper extremity supported, Feet supported Sitting balance-Leahy Scale: Fair     Standing balance support: Bilateral upper extremity supported, Reliant on assistive device for balance Standing balance-Leahy Scale: Poor Standing balance comment: modA x2                           ADL either performed or assessed with clinical judgement   ADL Overall ADL's : Needs assistance/impaired     Grooming: Set up;Sitting               Lower Body Dressing: Total assistance;+2 for safety/equipment;+2 for physical assistance Lower Body Dressing Details (indicate cue type and reason): for donning RLE prosthesis; continues to be ill fitting taking OT/PT to don appropriately; [facility] contacted to come see. Toilet Transfer: Minimal Arboriculturist Details (indicate cue type and reason): for lateral scoot chair to slightly elevated bed (uphill)                Extremity/Trunk Assessment Upper Extremity Assessment Upper Extremity Assessment: Left hand dominant;RUE deficits/detail;LUE deficits/detail  RUE Deficits / Details: Pt reports numbness now in RUE as well up to level of mid palm from digits. LUE Deficits / Details: grip 3+/5; pt with numbness and decr dexterity   Lower Extremity Assessment Lower Extremity Assessment: Defer to PT evaluation        Vision   Vision Assessment?: No apparent visual deficits   Perception     Praxis      Cognition Arousal:  Alert Behavior During Therapy: WFL for tasks assessed/performed Overall Cognitive Status: Impaired/Different from baseline Area of Impairment: Memory                     Memory: Decreased short-term memory         General Comments: pt reports utilizing a sliding board to transfer this morning with mobility specialist. Mobility specialist reports the pt did not complete a lateral transfer, instead the pt requested to attempt standing        Exercises      Shoulder Instructions       General Comments VSS on RA. Pt remains unable to don prosthesis and currently requires +2 A for STS. Raises concern for self care and hygiene at home, thus recommending drop arm BSC for home use to optimize safety and allow transfer via lateral scoot as wheelchair will not fit into restroom    Pertinent Vitals/ Pain       Pain Assessment Pain Assessment: No/denies pain  Home Living                                          Prior Functioning/Environment              Frequency  Min 1X/week        Progress Toward Goals  OT Goals(current goals can now be found in the care plan section)  Progress towards OT goals: Progressing toward goals  Acute Rehab OT Goals Patient Stated Goal: go to rehab OT Goal Formulation: With patient Time For Goal Achievement: 11/08/23 Potential to Achieve Goals: Good ADL Goals Pt Will Perform Grooming: with contact guard assist;standing Pt Will Perform Lower Body Dressing: with contact guard assist;sit to/from stand Pt Will Transfer to Toilet: with contact guard assist;ambulating  Plan      Co-evaluation    PT/OT/SLP Co-Evaluation/Treatment: Yes Reason for Co-Treatment: Complexity of the patient's impairments (multi-system involvement);Necessary to address cognition/behavior during functional activity;For patient/therapist safety;To address functional/ADL transfers PT goals addressed during session: Mobility/safety with  mobility;Balance;Proper use of DME;Strengthening/ROM OT goals addressed during session: ADL's and self-care;Proper use of Adaptive equipment and DME      AM-PAC OT "6 Clicks" Daily Activity     Outcome Measure   Help from another person eating meals?: None Help from another person taking care of personal grooming?: A Little Help from another person toileting, which includes using toliet, bedpan, or urinal?: A Lot Help from another person bathing (including washing, rinsing, drying)?: A Little Help from another person to put on and taking off regular upper body clothing?: A Little Help from another person to put on and taking off regular lower body clothing?: A Lot 6 Click Score: 17    End of Session Equipment Utilized During Treatment: Gait belt;Rolling walker (2 wheels);Other (comment) (RLE prosthesis)  OT Visit Diagnosis: Unsteadiness on feet (R26.81);Muscle weakness (generalized) (M62.81);History of falling (Z91.81)   Activity Tolerance Patient tolerated treatment well  Patient Left in bed;with call bell/phone within reach;with bed alarm set   Nurse Communication Mobility status        Time: 1356-1420 OT Time Calculation (min): 24 min  Charges: OT General Charges $OT Visit: 1 Visit OT Treatments $Self Care/Home Management : 8-22 mins  Tyler Deis, OTR/L [facility] Acute Rehabilitation Office: [phone]   Myrla Halsted 10/27/2023, 3:13 PM

## 2023-10-27 NOTE — Discharge Summary (Signed)
Physician Discharge Summary   Patient: [name] YOKEL Sr. MRN: [id number] DOB: 11-Jun-1961  Admit date:     10/23/2023  Discharge date: 10/27/23  Discharge Physician: Briant Cedar   PCP: Pcp, No   Recommendations at discharge:   Follow-up with PCP in 1 week-establish care with PCP Follow-up with nephrology as scheduled  Discharge Diagnoses: Principal Problem:   Hyperosmolar hyperglycemic state (HHS) (HCC) Active Problems:   ESRD on dialysis (HCC)   HTN (hypertension)   CHF (congestive heart failure) (HCC)   Hyperglycemia   Abnormal EKG    Hospital Course: [name] ENWRIGHT Sr. is a 62 y.o. male with medical history significant of ESRD on dialysis MWF, CHF, DM, HTN, PVD status post right BKA, history of CVA, hypergammaglobulinemia, hyperferritinemia. Patient presented to the ED after a mechanical fall and complained of left hip, thigh, and knee pain.  Noted to be hyperglycemic (ran out of home insulin) with CBG >600 on subsequent checks but not in DKA (bicarb and anion gap normal).  Serum osmolarity 329.  He was started on insulin drip for HHS and was given >2 L IV fluids in the ED.  EKG showing new T wave inversions in inferior and lateral leads and ST elevation in aVL. Troponin rising, in the setting of ESRD. X-ray of left femur and pelvis negative for fractures.  Patient also complained of abdominal pain. CT renal stone study showing no acute findings.  TRH called to admit.    Today, patient denies any new complaints.  Had an extensive discussion with PT as well as case manager concerning safe discharge planning.  PT called [employer] to help patient with his prosthesis for Betha feeds to enable him to ambulate.  PCP was established for patient.  Patient lives with his brother, who is unable to take care of him.  Home health PT/OT established as well as aide.  PT/OT initially recommended SNF, but patient has straight Medicare and observation status and cannot be placed.    Assessment  and Plan:  Hyperglycemia/suspected HHS DM type 2, uncontrolled Last A1c 5.2 on 10/2022, repeat greater than 15 CBG >600 without signs of DKA (bicarb and anion gap normal), Serum osmolarity 329 S/p insulin drip for HHS SSI, Semglee, NovoLog 3 times daily, Accu-Cheks, hypoglycemic protocol Diabetes coordinator consulted, patient was on 70/30 insulin and reported good control until he ran out off his insulin Discharge patient on insulin 70/30, 30 units in a.m. and 25 units in p.m. Follow-up with PCP for further management   Elevated troponin In the setting of ESRD EKG showing new T wave inversions in inferior and lateral leads and ST elevation in aVL Initial troponin 84-->243-->558--> 296 ECHO with EF of 45 to 50%, global hypokinesis, grade 2 diastolic dysfunction, unchanged from previous echo done in 2023 Discussed extensively with cardiology Dr. Wyline Mood on 12/9, no further workup recommended from a cardiology standpoint Patient remains currently asymptomatic   ESRD on dialysis MWF Hyponatremia Monitor for signs of volume overload Nephrology for HD   Chronic systolic HF Last echo done in March 2023 showing EF 45 to 50%, RV systolic function mildly reduced, mild mitral valve regurgitation, mild aortic valve stenosis Volume management with dialysis   QT prolongation QTc 526 on EKG, now resolved on repeat EKG Monitor potassium and magnesium levels, avoid QT prolonging drugs   Generalized weakness/fall at home Left knee pain Left knee range of motion limited Left knee x-ray, no evidence of fracture dislocation or joint effusion PT/OT eval, fall precautions  Hypertension Currently normotensive   History of CVA PVD Right BKA   Obesity Lifestyle modification advised         Consultants: Nephrology Procedures performed: None Disposition: Home health Diet recommendation:  Cardiac and Carb modified diet    DISCHARGE MEDICATION: Allergies as of 10/27/2023        Reactions   Sulfa Antibiotics Diarrhea, Nausea And Vomiting        Medication List     STOP taking these medications    insulin glargine 100 UNIT/ML injection Commonly known as: LANTUS       TAKE these medications    acetaminophen 500 MG tablet Commonly known as: TYLENOL Take 1,000 mg by mouth 2 (two) times daily as needed for moderate pain (pain score 4-6), headache or fever.   cinacalcet 30 MG tablet Commonly known as: SENSIPAR Take 30 mg by mouth daily.   Dialyvite 800 0.8 MG Tabs Take 1 tablet by mouth daily.   lidocaine-prilocaine cream Commonly known as: EMLA Apply 1 Application topically Every Tuesday,Thursday,and Saturday with dialysis.   NovoLIN 70/30 (70-30) 100 UNIT/ML injection Generic drug: insulin NPH-regular Human Inject 30U in the morning and 25U in the evening   sevelamer carbonate 800 MG tablet Commonly known as: RENVELA Take 2,400-4,000 mg by mouth See admin instructions. Take 5 tablets (4000mg ) three times daily with meals and 3 tablets (2400mg ) twice daily with snacks.               Durable Medical Equipment  (From admission, onward)           Start     Ordered   10/27/23 1623  For home use only DME Bedside commode  Once       Comments: Drop arm  Question:  Patient needs a bedside commode to treat with the following condition  Answer:  Weakness   10/27/23 1623            Follow-up Information     [facility] Follow up on 11/17/2023.   Specialty: Family Medicine Why: 9:10 am for hospital follow up Contact information: [address] [place] [facility] [phone]        [facility] Follow up.   Specialty: Home Health Services Why: Agency will call you to set up apt times Contact information: [address] [place] [address] [phone]         [facility] Follow up.   Why: drop arm bsc Contact information: [phone]         Schmerge, Belenda Cruise, NP Follow up.   Specialty: Nurse Practitioner Contact information: [address] [place] [address] [phone]                Discharge Exam: Filed Weights   10/24/23 0604 10/26/23 1155 10/26/23 1602  Weight: 108.9 kg 113 kg 110.5 kg   General: NAD, chronically ill-appearing Cardiovascular: S1, S2 present Respiratory: CTAB Abdomen: Soft, nontender, nondistended, bowel sounds present Musculoskeletal: No bilateral pedal edema noted, right BKA, left upper arm AV fistula Skin: Normal Psychiatry: Normal mood   Condition at discharge: stable  The results of significant diagnostics from this hospitalization (including imaging, microbiology, ancillary and laboratory) are listed below for reference.   Imaging Studies: ECHOCARDIOGRAM COMPLETE  Result Date: 10/25/2023    ECHOCARDIOGRAM REPORT   Patient Name:   [name] CARNELL Sr. Date of Exam: 10/25/2023 Medical Rec #:  [id number]  Height:       73.0 in Accession #:    [id number]        Weight:       240.0 lb Date of Birth:  08/23/1961        BSA:          2.325 m Patient Age:    62 years          BP:           123/73 mmHg Patient Gender: M                 HR:           60 bpm. Exam Location:  Inpatient Procedure: 2D Echo, Cardiac Doppler and Color Doppler Indications:    Elevated Troponin  History:        Patient has prior history of Echocardiogram examinations, most                 recent 02/05/2022. Cardiomyopathy and CHF, Previous Myocardial                 Infarction; Risk Factors:Hypertension and Diabetes.  Sonographer:    Darlys Gales Referring Phys: [phone] JESSICA U VANN IMPRESSIONS  1. Left ventricular ejection fraction, by estimation, is 45 to 50%. The left ventricle has mildly decreased function. The left ventricle demonstrates global hypokinesis. There is mild concentric left ventricular hypertrophy. Left ventricular diastolic parameters are consistent with Grade II diastolic  dysfunction (pseudonormalization).  2. RV is not well visualized, function looks at least mildly reduced. The right ventricular size is not well visualized. There is moderately elevated pulmonary artery systolic pressure. The estimated right ventricular systolic pressure is 52.2 mmHg.  3. The mitral valve is degenerative. Mild mitral valve regurgitation. Mild mitral stenosis. The mean mitral valve gradient is 5.0 mmHg. Severe mitral annular calcification.  4. Tricuspid valve regurgitation is moderate.  5. The aortic valve is calcified. Aortic valve regurgitation is not visualized. Mild to moderate aortic valve stenosis.  6. The inferior vena cava is dilated in size with <50% respiratory variability, suggesting right atrial pressure of 15 mmHg. Conclusion(s)/Recommendation(s): Increased pulmonary HTN compared to prior study can be VO. FINDINGS  Left Ventricle: Left ventricular ejection fraction, by estimation, is 45 to 50%. The left ventricle has mildly decreased function. The left ventricle demonstrates global hypokinesis. The left ventricular internal cavity size was normal in size. There is  mild concentric left ventricular hypertrophy. Left ventricular diastolic parameters are consistent with Grade II diastolic dysfunction (pseudonormalization). Right Ventricle: RV is not well visualized, function looks at least mildly reduced. The right ventricular size is not well visualized. There is moderately elevated pulmonary artery systolic pressure. The tricuspid regurgitant velocity is 3.05 m/s, and with an assumed right atrial pressure of 15 mmHg, the estimated right ventricular systolic pressure is 52.2 mmHg. Left Atrium: Left atrial size was normal in size. Right Atrium: Right atrial size was normal in size. Pericardium: There is no evidence of pericardial effusion. Mitral Valve: The mitral valve is degenerative in appearance. Severe mitral annular calcification. Mild mitral valve regurgitation. Mild mitral valve  stenosis. MV peak gradient, 7.6 mmHg. The mean mitral valve gradient is 5.0 mmHg. Tricuspid Valve: Tricuspid valve regurgitation is moderate. Aortic Valve: The aortic valve is calcified. Aortic valve regurgitation is not visualized. Mild to moderate aortic stenosis is present. Aortic valve mean gradient measures 18.0 mmHg. Aortic valve peak gradient measures 31.4 mmHg. Aortic valve area, by VTI measures 0.81 cm. Pulmonic Valve:  Pulmonic valve regurgitation is not visualized. Aorta: Aortic root could not be assessed. Venous: The inferior vena cava is dilated in size with less than 50% respiratory variability, suggesting right atrial pressure of 15 mmHg. IAS/Shunts: The interatrial septum was not well visualized.  LEFT VENTRICLE PLAX 2D LVIDd:         5.80 cm   Diastology LVIDs:         4.10 cm   LV e' medial:    4.13 cm/s LV PW:         1.10 cm   LV E/e' medial:  33.4 LV IVS:        1.20 cm   LV e' lateral:   5.55 cm/s LVOT diam:     2.00 cm   LV E/e' lateral: 24.9 LV SV:         44 LV SV Index:   19 LVOT Area:     3.14 cm  RIGHT VENTRICLE RV S prime:     5.11 cm/s TAPSE (M-mode): 0.9 cm LEFT ATRIUM             Index        RIGHT ATRIUM           Index LA Vol (A2C):   63.2 ml 27.18 ml/m  RA Area:     14.20 cm LA Vol (A4C):   58.4 ml 25.12 ml/m  RA Volume:   30.10 ml  12.95 ml/m LA Biplane Vol: 64.2 ml 27.61 ml/m  AORTIC VALVE AV Area (Vmax):    0.85 cm AV Area (Vmean):   0.80 cm AV Area (VTI):     0.81 cm AV Vmax:           280.00 cm/s AV Vmean:          198.000 cm/s AV VTI:            0.550 m AV Peak Grad:      31.4 mmHg AV Mean Grad:      18.0 mmHg LVOT Vmax:         75.90 cm/s LVOT Vmean:        50.200 cm/s LVOT VTI:          0.141 m LVOT/AV VTI ratio: 0.26 MITRAL VALVE                TRICUSPID VALVE MV Area (PHT): 4.17 cm     TR Peak grad:   37.2 mmHg MV Area VTI:   1.38 cm     TR Vmax:        305.00 cm/s MV Peak grad:  7.6 mmHg MV Mean grad:  5.0 mmHg     SHUNTS MV Vmax:       1.38 m/s     Systemic  VTI:  0.14 m MV Vmean:      104.0 cm/s   Systemic Diam: 2.00 cm MV Decel Time: 182 msec MV E velocity: 138.00 cm/s MV A velocity: 78.30 cm/s MV E/A ratio:  1.76 [place] signed by Carolan Clines Signature Date/Time: 10/25/2023/2:42:48 PM    Final    DG Knee Complete 4 Views Left  Result Date: 10/24/2023 CLINICAL DATA:  Fall.  No left knee pain. EXAM: LEFT KNEE - COMPLETE 4+ VIEW COMPARISON:  None Available. FINDINGS: No evidence of fracture, dislocation, or joint effusion. No evidence of arthropathy or other focal bone abnormality. Soft tissues are unremarkable. IMPRESSION: Negative. Electronically Signed   By: Kennith Center M.D.   On: 10/24/2023 07:53  CT Renal Stone Study  Result Date: 10/24/2023 CLINICAL DATA:  Abdominal pain EXAM: CT ABDOMEN AND PELVIS WITHOUT CONTRAST TECHNIQUE: Multidetector CT imaging of the abdomen and pelvis was performed following the standard protocol without IV contrast. RADIATION DOSE REDUCTION: This exam was performed according to the departmental dose-optimization program which includes automated exposure control, adjustment of the mA and/or kV according to patient size and/or use of iterative reconstruction technique. COMPARISON:  MRI abdomen dated 10/30/2022 FINDINGS: Lower chest: Mild left basilar scarring/atelectasis. Hepatobiliary: Unenhanced liver is grossly unremarkable, noting a mildly nodular hepatic contour. Cholelithiasis, without associated inflammatory changes. No intrahepatic or extrahepatic duct dilatation. Pancreas: Within normal limits. Spleen: Within normal limits. Adrenals/Urinary Tract: Adrenal glands are within normal limits. Bilateral renal cortical scarring/atrophy. Bilateral renal cysts, measuring up to 18 mm in the posterior right lower kidney (series 3/image 37), benign (Bosniak I). No follow-up is recommended. Bilateral renal vascular calcifications. No renal calculi or hydronephrosis. Bladder is underdistended but unremarkable.  Stomach/Bowel: Stomach is within normal limits. No evidence of bowel obstruction. Normal appendix (series 3/image 62). No colonic wall thickening or inflammatory changes. Vascular/Lymphatic: No evidence of abdominal aortic aneurysm. Extensive atherosclerotic calcifications of the abdominal aorta and branch vessels. No suspicious abdominopelvic lymphadenopathy. Reproductive: Prostate is unremarkable. Other: No abdominopelvic ascites. Musculoskeletal: Mild degenerative changes at L4-5. IMPRESSION: No CT findings to account for the patient's abdominal pain. Cholelithiasis, without associated inflammatory changes. Additional ancillary findings as above. Electronically Signed   By: Charline Bills M.D.   On: 10/24/2023 01:10   DG Pelvis 1-2 Views  Result Date: 10/23/2023 CLINICAL DATA:  Fall with left hip pain EXAM: PELVIS - 1-2 VIEW COMPARISON:  CT 08/05/2019 FINDINGS: SI joints are non widened. Extensive vascular calcifications. No fracture or malalignment. Pubic symphysis and rami appear intact. Mild bilateral hip degenerative change IMPRESSION: No acute osseous abnormality. Electronically Signed   By: Jasmine Pang M.D.   On: 10/23/2023 19:17   DG Femur Min 2 Views Left  Result Date: 10/23/2023 CLINICAL DATA:  Fall with left hip pain EXAM: LEFT FEMUR 2 VIEWS COMPARISON:  None Available. FINDINGS: There is no evidence of fracture or other focal bone lesions. Soft tissues are unremarkable. Extensive vascular calcifications. IMPRESSION: Negative. Electronically Signed   By: Jasmine Pang M.D.   On: 10/23/2023 19:16    Microbiology: Results for orders placed or performed during the hospital encounter of 10/18/22  Resp Panel by RT-PCR (Flu A&B, Covid) Anterior Nasal Swab     Status: None   Collection Time: 10/18/22  2:52 PM   Specimen: Anterior Nasal Swab  Result Value Ref Range Status   SARS Coronavirus 2 by RT PCR NEGATIVE NEGATIVE Final    Comment: (NOTE) SARS-CoV-2 target nucleic acids are NOT  DETECTED.  The SARS-CoV-2 RNA is generally detectable in upper respiratory specimens during the acute phase of infection. The lowest concentration of SARS-CoV-2 viral copies this assay can detect is 138 copies/mL. A negative result does not preclude SARS-Cov-2 infection and should not be used as the sole basis for treatment or other patient management decisions. A negative result may occur with  improper specimen collection/handling, submission of specimen other than nasopharyngeal swab, presence of viral mutation(s) within the areas targeted by this assay, and inadequate number of viral copies(<138 copies/mL). A negative result must be combined with clinical observations, patient history, and epidemiological information. The expected result is Negative.  Fact Sheet for Patients:  BloggerCourse.com  Fact Sheet for Healthcare Providers:  SeriousBroker.it  This test is no  t yet approved or cleared by the [place] and  has been authorized for detection and/or diagnosis of SARS-CoV-2 by FDA under an Emergency Use Authorization (EUA). This EUA will remain  in effect (meaning this test can be used) for the duration of the COVID-19 declaration under Section 564(b)(1) of the Act, 21 U.S.C.section 360bbb-3(b)(1), unless the authorization is terminated  or revoked sooner.       Influenza A by PCR NEGATIVE NEGATIVE Final   Influenza B by PCR NEGATIVE NEGATIVE Final    Comment: (NOTE) The Xpert Xpress SARS-CoV-2/FLU/RSV plus assay is intended as an aid in the diagnosis of influenza from Nasopharyngeal swab specimens and should not be used as a sole basis for treatment. Nasal washings and aspirates are unacceptable for Xpert Xpress SARS-CoV-2/FLU/RSV testing.  Fact Sheet for Patients: BloggerCourse.com  Fact Sheet for Healthcare Providers: SeriousBroker.it  This test is not yet  approved or cleared by the [place] FDA and has been authorized for detection and/or diagnosis of SARS-CoV-2 by FDA under an Emergency Use Authorization (EUA). This EUA will remain in effect (meaning this test can be used) for the duration of the COVID-19 declaration under Section 564(b)(1) of the Act, 21 U.S.C. section 360bbb-3(b)(1), unless the authorization is terminated or revoked.  Performed at [facility], [address], [place], [place] [address]   MRSA Next Gen by PCR, Nasal     Status: None   Collection Time: 10/19/22  6:13 AM   Specimen: Nasal Mucosa; Nasal Swab  Result Value Ref Range Status   MRSA by PCR Next Gen NOT DETECTED NOT DETECTED Final    Comment: (NOTE) The GeneXpert MRSA Assay (FDA approved for NASAL specimens only), is one component of a comprehensive MRSA colonization surveillance program. It is not intended to diagnose MRSA infection nor to guide or monitor treatment for MRSA infections. Test performance is not FDA approved in patients less than 21 years old. Performed at [facility], [address] [place], [place] [address]     Labs: CBC: Recent Labs  Lab 10/22/23 2343 10/23/23 1742 10/24/23 0135 10/24/23 0618 10/25/23 1500 10/26/23 0214 10/27/23 0208  WBC 6.2 5.4  --   --  6.7 5.7 6.6  NEUTROABS  --  3.0  --   --  3.5 2.5 3.3  HGB 15.6 16.2 17.7* 15.6 14.6 14.1 13.4  HCT 47.9 48.6 52.0 46.0 44.7 44.3 41.0  MCV 90.9 89.5  --   --  88.9 91.2 89.1  PLT 202 161  --   --  180 161 180   Basic Metabolic Panel: Recent Labs  Lab 10/24/23 0608 10/24/23 0618 10/24/23 1655 10/25/23 1500 10/26/23 0214 10/27/23 0208  NA 132* 131* 127* 126* 126* 128*  K 4.1 4.2 4.9 5.2* 4.9 4.5  CL 92*  --  89* 88* 89* 90*  CO2 25  --  26 23 23 24   GLUCOSE 191*  --  202* 275* 260* 264*  BUN 35*  --  39* 49* 55* 36*  CREATININE 7.95*  --  7.99* 10.15* 10.98* 8.37*  CALCIUM 9.3  --  10.1 10.0 9.8 9.2  MG 1.9  --   --  2.2  --   --   PHOS   --   --   --  6.3* 6.6* 5.2*   Liver Function Tests: Recent Labs  Lab 10/23/23 0009 10/23/23 1742 10/25/23 1500 10/26/23 0214 10/27/23 0208  AST 40 29  --   --   --  ALT 32 29  --   --   --   ALKPHOS 141* 152*  --   --   --   BILITOT 0.8 0.6  --   --   --   PROT 7.9 8.2*  --   --   --   ALBUMIN 3.1* 3.1* 2.9* 2.8* 2.7*   CBG: Recent Labs  Lab 10/26/23 1745 10/26/23 2106 10/27/23 0635 10/27/23 1143 10/27/23 1612  GLUCAP 86 293* 257* 178* 223*    Discharge time spent: less than 30 minutes.  Signed: Briant Cedar, MD [facility] 10/27/2023

## 2023-10-27 NOTE — Inpatient Diabetes Management (Signed)
Inpatient Diabetes Program Recommendations  AACE/ADA: New Consensus Statement on Inpatient Glycemic Control (2015)  Target Ranges:  Prepandial:   less than 140 mg/dL      Peak postprandial:   less than 180 mg/dL (1-2 hours)      Critically ill patients:  140 - 180 mg/dL   Lab Results  Component Value Date   GLUCAP 178 (H) 10/27/2023   HGBA1C >15.5 (H) 10/24/2023    Review of Glycemic Control  Latest Reference Range & Units 10/26/23 06:17 10/26/23 11:10 10/26/23 17:45 10/26/23 21:06 10/27/23 06:35 10/27/23 11:43  Glucose-Capillary 70 - 99 mg/dL 161 (H) 096 (H) 86 045 (H) 257 (H) 178 (H)   Diabetes history: DM 2 Outpatient Diabetes medications:  70/30 30 units in the AM and 25 units in the PM- ran out of this insulin b/c no PCP?? Current orders for Inpatient glycemic control:  Novolog 0-9 units tid with meals and HS Novolog 3 units tid with meals  Semglee 30 units daily  Inpatient Diabetes Program Recommendations:    Spoke to patient at bedside.  He states that he was taking 70/30 prior to running out b/c he has no PCP. He states that when he takes his insulin, blood sugars are controlled in the 150's.  We discussed his current A1C and need for insulin.  He states that he has no needs at this time related to his DM but just needs PCP/insulin.  Note history of ESRD.    Thanks,  Beryl Meager, RN, BC-ADM Inpatient Diabetes Coordinator Pager [phone]  (8a-5p)

## 2023-10-27 NOTE — Progress Notes (Signed)
Physical Therapy Treatment Patient Details Name: [name] [name]. MRN: [id number] DOB: 12-18-1960 Today's Date: 10/27/2023   History of Present Illness Pt is 62 year old presented to [facility] on  10/23/23 for fall and hyperglycemia after running out of insulin. X-rays of lt knee negative.PMH - htn, cardiomyopathy, CHF, dm, esrd on hd, R BKA, cva    PT Comments  Pt tolerates treatment well but remains unable to don his prosthetic in a safe and conventional manner. Pt does don prosthetic this session, but requires significant assistance of 2 therapists to stand up and then lock RLE into prosthetic. Pt later performs lateral scoot transfer with increased time and assistance to stabilize for safety, throwing himself across the bed during last scoot of transfer. Pt continues to have significant difficulty mobilizing and has inconsistent caregiver support at home. PT does reach out to [facility], requesting support during this admission for management of prosthetic fit. Prosthetist, Zollie Scale, reports she will attempt to come by this afternoon/evening to adjust prosthetic. PT continues to recommend inpatient PT services at the time of discharge due to significant assistance requirements for transfers and impaired ability to don prosthetic.  PT does not feel this patient will be able to safely transfer into a private vehicle at this time due to weakness and lack of support from R prosthetic. Pt will benefit from non-emergent transport to return home if discharging home. Pt also reports he does not feel his home has enough space to utilize a hoyer lift. Pt owns wheelchair at this time. Pt will benefit from a drop arm bedside commode to allow for transfers to the bathroom.   If plan is discharge home, recommend the following: A little help with walking and/or transfers;Assist for transportation;Help with stairs or ramp for entrance;Assistance with cooking/housework;A little help with bathing/dressing/bathroom   Can  travel by private vehicle        Equipment Recommendations  None recommended by PT    Recommendations for Other Services       Precautions / Restrictions Precautions Precautions: Fall Restrictions Weight Bearing Restrictions: No     Mobility  Bed Mobility Overal bed mobility: Needs Assistance Bed Mobility: Sit to Supine, Rolling Rolling: Supervision     Sit to supine: Contact guard assist        Transfers Overall transfer level: Needs assistance Equipment used: Rolling walker (2 wheels) Transfers: Sit to/from Stand, Bed to chair/wheelchair/BSC Sit to Stand: Mod assist, +2 physical assistance          Lateral/Scoot Transfers: Min assist General transfer comment: significant assistance to power up into standing in an effort to achieve locking of RLE prosthetic. Pt later laterally scoots from drop arm recliner to bed with minA for safety    Ambulation/Gait Ambulation/Gait assistance:  (unable to ambulate at this time due to impaired prosthetic fit)                 Stairs             Wheelchair Mobility     Tilt Bed    Modified Rankin (Stroke Patients Only)       Balance Overall balance assessment: Needs assistance Sitting-balance support: No upper extremity supported, Feet supported Sitting balance-Leahy Scale: Fair     Standing balance support: Bilateral upper extremity supported, Reliant on assistive device for balance Standing balance-Leahy Scale: Poor Standing balance comment: modA x2  Cognition Arousal: Alert Behavior During Therapy: WFL for tasks assessed/performed Overall Cognitive Status: Impaired/Different from baseline Area of Impairment: Memory                     Memory: Decreased short-term memory         General Comments: pt reports utilizing a sliding board to transfer this morning with mobility specialist. Mobility specialist reports the pt did not complete a lateral  transfer, instead the pt requested to attempt standing        Exercises      General Comments General comments (skin integrity, edema, etc.): VSS on RA, pt remains unable to don prosthetic without attempting to stand on prosthetic in an effort to lock pin. This is not a safe method for the patient to attempt at home as it requried 2 person assist today.      Pertinent Vitals/Pain Pain Assessment Pain Assessment: No/denies pain    Home Living                          Prior Function            PT Goals (current goals can now be found in the care plan section) Acute Rehab PT Goals Patient Stated Goal: go to rehab to get stronger Progress towards PT goals: Progressing toward goals (slow progress)    Frequency    Min 1X/week      PT Plan      Co-evaluation PT/OT/SLP Co-Evaluation/Treatment: Yes Reason for Co-Treatment: Complexity of the patient's impairments (multi-system involvement);Necessary to address cognition/behavior during functional activity;For patient/therapist safety;To address functional/ADL transfers PT goals addressed during session: Mobility/safety with mobility;Balance;Proper use of DME;Strengthening/ROM        AM-PAC PT "6 Clicks" Mobility   Outcome Measure  Help needed turning from your back to your side while in a flat bed without using bedrails?: A Little Help needed moving from lying on your back to sitting on the side of a flat bed without using bedrails?: A Little Help needed moving to and from a bed to a chair (including a wheelchair)?: A Little Help needed standing up from a chair using your arms (e.g., wheelchair or bedside chair)?: A Lot Help needed to walk in hospital room?: Total Help needed climbing 3-5 steps with a railing? : Total 6 Click Score: 13    End of Session Equipment Utilized During Treatment: Gait belt;Other (comment) Activity Tolerance: Patient tolerated treatment well Patient left: in bed;with call bell/phone  within reach;with bed alarm set Nurse Communication: Need for lift equipment;Mobility status PT Visit Diagnosis: Unsteadiness on feet (R26.81);Other abnormalities of gait and mobility (R26.89);Muscle weakness (generalized) (M62.81);History of falling (Z91.81)     Time: 1308-6578 PT Time Calculation (min) (ACUTE ONLY): 24 min  Charges:    $Therapeutic Activity: 8-22 mins PT General Charges $$ ACUTE PT VISIT: 1 Visit                     Arlyss Gandy, PT, DPT Acute Rehabilitation Office [phone]    Arlyss Gandy 10/27/2023, 3:09 PM

## 2023-10-27 NOTE — TOC Progression Note (Signed)
Transition of Care (TOC) - Progression Note    Patient Details  Name: [name] BINGENHEIMER Sr. MRN: [id number] Date of Birth: Aug 10, 1961  Transition of Care [facility]) CM/SW Contact  Marliss Coots, LCSW Phone Number: 10/27/2023, 3:57 PM  Clinical Narrative:     11:00AM This CSW and RNCM Letha Cape introduced themselves and roles to patient at bedside. CSW and RNCM inquired about discharge plans. Patient stated that he would be discharging to [facility] (long-term bed). Patient attempted to contact Tish with [facility] will CSW and RNCM were present at bedside, but was told that Tish was unavailable. CSW offered to contact [facility] regarding bed availability, which patient accepted. [facility] informed CSW (via secure text) that they do not have long-term beds and spoke with [facility] administrator who said that patient "is not someone they would consider." [facility] bed availability and inability to meet patients needs were reiterated by this CSW to patient, who expressed understanding of the information. CSW inquired about housing and transportation. Patient stated that he was attempting to contact a family/friend for transportation and that he resides with his brother. CSW asked if patient felt safe to return home. Patient stated that due to their medical needs they did not feel safe returning home. RNCM Gavin Pound is coordinating home health with patient and medical team. CSW and RNCM offered a taxi voucher for discharge, which patient accepted.   Expected Discharge Plan: Home w Home Health Services Barriers to Discharge: Continued Medical Work up  Expected Discharge Plan and Services In-house Referral: Clinical Social Work Discharge Planning Services: CM Consult Post Acute Care Choice: Home Health Living arrangements for the past 2 months: Single Family Home                 DME Arranged: N/A DME Agency: NA       HH Arranged: NA           Social Determinants of Health (SDOH) Interventions SDOH  Screenings   Food Insecurity: No Food Insecurity (10/24/2023)  Housing: Low Risk  (10/24/2023)  Transportation Needs: No Transportation Needs (10/24/2023)  Utilities: Not At Risk (10/24/2023)  Alcohol Screen: Low Risk  (02/04/2022)  Depression (PHQ2-9): Low Risk  (08/06/2022)  Tobacco Use: Low Risk  (10/24/2023)    Readmission Risk Interventions    11/05/2022    3:08 PM  Readmission Risk Prevention Plan  Transportation Screening Complete  Medication Review (RN Care Manager) Complete  PCP or Specialist appointment within 3-5 days of discharge Complete  HRI or Home Care Consult Complete  SW Recovery Care/Counseling Consult Complete  Palliative Care Screening Complete  Skilled Nursing Facility Complete

## 2023-10-27 NOTE — Plan of Care (Signed)
  Problem: Nutritional: Goal: Maintenance of adequate nutrition will improve Outcome: Progressing   Problem: Skin Integrity: Goal: Risk for impaired skin integrity will decrease Outcome: Progressing   Problem: Clinical Measurements: Goal: Will remain free from infection Outcome: Progressing   Problem: Activity: Goal: Risk for activity intolerance will decrease Outcome: Progressing   Problem: Nutrition: Goal: Adequate nutrition will be maintained Outcome: Progressing

## 2023-10-27 NOTE — Progress Notes (Signed)
Patient ID: [name] Lance Sr., male   DOB: 12-28-1960, 62 y.o.   MRN: [id number] [facility] Progress Note   Subjective:   Denies any acute events overnight, denies chest pain or shortness of breath.   Objective:   BP 96/73 (BP Location: Right Arm)   Pulse 79   Temp 98.1 F (36.7 C) (Oral)   Resp 20   Ht 6\' 1"  (1.854 m)   Wt 110.5 kg   SpO2 95%   BMI 32.14 kg/m   Physical Exam: Gen: Comfortably resting in bed, getting labs drawn CVS: Pulse regular rhythm, normal rate, S1 and S2 normal Resp: Clear to auscultation, no rales/rhonchi Abd: Soft, obese, nontender, bowel sounds normal Ext: Status post right BKA.  No edema left lower extremity.  Left upper arm aVF with palpable thrill  OP HD: [facility] MWF  4h  450/ 700   104kg   2/2 bath  AVF   Heparin 7000+ 2000 midrun  Assessment/ Plan:   Hyperglycemia with hyperosmolality: Without evidence of diabetic ketoacidosis-thought to be due to nonadherence with insulin and was started on insulin drip upon admission.  Additional management per primary service. End-stage renal disease: on HD MWF. HD tomorrow.  Left knee pain status post mechanical fall: Evaluation negative for osseous injury, symptomatic management. 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  Resume VDR a with dialysis. Hypertension: Blood pressures at goal, continue to monitor for orthostasis. Volume: volume up, + 6kg by wts, on room air, no edema on exam. Max UF next HD.  Elevated troponin with EKG changes: Management per TRH including echocardiogram. Seen by cardiology, no further w/u planned.  Dispo - awaiting SNF placement it appears  Vinson Moselle  MD  [facility] 10/27/2023, 2:11 PM  Recent Labs  Lab 10/26/23 0214 10/27/23 0208  HGB 14.1 13.4  ALBUMIN 2.8* 2.7*  CALCIUM 9.8 9.2  PHOS 6.6* 5.2*  CREATININE 10.98* 8.37*  K 4.9 4.5    Inpatient medications:  Chlorhexidine Gluconate Cloth  6 each Topical Q0600   cinacalcet  30 mg Oral  Daily   heparin injection (subcutaneous)  5,000 Units Subcutaneous Q8H   insulin aspart  0-5 Units Subcutaneous QHS   insulin aspart  0-9 Units Subcutaneous TID WC   insulin aspart  3 Units Subcutaneous TID WC   insulin glargine-yfgn  30 Units Subcutaneous Daily   sevelamer carbonate  4,000 mg Oral TID WC   sodium chloride flush  10 mL Intravenous Q12H    acetaminophen **OR** acetaminophen, dextrose, sevelamer carbonate

## 2023-10-28 ENCOUNTER — Other Ambulatory Visit (HOSPITAL_COMMUNITY): Payer: Self-pay

## 2023-10-28 DIAGNOSIS — E1100 Type 2 diabetes mellitus with hyperosmolarity without nonketotic hyperglycemic-hyperosmolar coma (NKHHC): Secondary | ICD-10-CM | POA: Diagnosis not present

## 2023-10-28 DIAGNOSIS — E1165 Type 2 diabetes mellitus with hyperglycemia: Secondary | ICD-10-CM | POA: Diagnosis not present

## 2023-10-28 LAB — GLUCOSE, CAPILLARY: Glucose-Capillary: 134 mg/dL — ABNORMAL HIGH (ref 70–99)

## 2023-10-28 MED ORDER — INSULIN PEN NEEDLE 32G X 4 MM MISC
0 refills | Status: DC
  Filled 2023-10-28: qty 100, 30d supply, fill #0

## 2023-10-28 NOTE — TOC Transition Note (Signed)
Transition of Care [facility]) - CM/SW Discharge Note   Patient Details  Name: [name] FORBUSH Sr. MRN: [id number] Date of Birth: Mar 22, 1961  Transition of Care [facility]) CM/SW Contact:  Harriet Masson, RN Phone Number: 10/28/2023, 10:02 AM   Clinical Narrative:    Patient stable for discharge.  Cory with [facility] notified of discharge.  Patient aware to call and cancel apt with [facility] if he is able to make in house MD apt.      Final next level of care: Home w Home Health Services Barriers to Discharge: Barriers Resolved   Patient Goals and CMS Choice CMS Medicare.gov Compare Post Acute Care list provided to:: Patient Choice offered to / list presented to : NA  Discharge Placement                 home        Discharge Plan and Services Additional resources added to the After Visit Summary for   In-house Referral: Clinical Social Work Discharge Planning Services: CM Consult Post Acute Care Choice: Home Health          DME Arranged: N/A DME Agency: NA       HH Arranged: NA          Social Determinants of Health (SDOH) Interventions SDOH Screenings   Food Insecurity: No Food Insecurity (10/24/2023)  Housing: Low Risk  (10/24/2023)  Transportation Needs: No Transportation Needs (10/24/2023)  Utilities: Not At Risk (10/24/2023)  Alcohol Screen: Low Risk  (02/04/2022)  Depression (PHQ2-9): Low Risk  (08/06/2022)  Tobacco Use: Low Risk  (10/24/2023)     Readmission Risk Interventions    11/05/2022    3:08 PM  Readmission Risk Prevention Plan  Transportation Screening Complete  Medication Review (RN Care Manager) Complete  PCP or Specialist appointment within 3-5 days of discharge Complete  HRI or Home Care Consult Complete  SW Recovery Care/Counseling Consult Complete  Palliative Care Screening Complete  Skilled Nursing Facility Complete

## 2023-10-28 NOTE — Progress Notes (Signed)
Went over AVS paperwork with patient, answered any/all questions, ivs removed, TOC meds given to patient, Home meds returned to patient from pharmacy, gathered patient belongings and patient was rolled out to transport vehicle.

## 2023-10-28 NOTE — Progress Notes (Signed)
Patient is feeling well this morning with no chest pain or dyspnea.   BP 113/61 (BP Location: Right Arm)   Pulse 75   Temp 98.2 F (36.8 C) (Oral)   Resp 20   Ht 6\' 1"  (1.854 m)   Wt 110.5 kg   SpO2 97%   BMI 32.14 kg/m   Neurology awake and alert ENT with no pallor Cardiovascular with S1 and S2 present and regular with no gallops, rubs or murmurs Respiratory with no rales or wheezing Abdomen with no distention  No lower extremity edema (noted right BKA).   Hyperglycemic hyperosmolar state has resolved.  Am capillary glucose is 134 mg/dl .  Plan for discharge today, he will have outpatient hemodialysis.

## 2023-10-28 NOTE — Discharge Planning (Signed)
[facility] Patient Discharge Orders- [facility] CLINIC: [place]  Patient's name: [name] AKSAMIT Sr. Admit/DC Dates: 10/23/2023 - 10/28/2023  Discharge Diagnoses: Hyperosmolar hyperglycemic state   ESRD  Aranesp: Given: no   Date and amount of last dose: N/A  Last Hgb: 13.4 PRBC's Given: no Date/# of units: N/A ESA dose for discharge: none IV Iron dose at discharge: none  Heparin change: mo  EDW Change: No New EDW:   Bath Change: no  Access intervention/Change: no Details:  Hectorol/Calcitriol change: no  Discharge Labs: Calcium 9.2 Phosphorus 5.2 Albumin 2.7 K+ 4.5  IV Antibiotics: no Details:  On Coumadin?: no Last INR: Next INR: Managed By:   OTHER/APPTS/LAB ORDERS:    D/C Meds to be reconciled by nurse after every discharge.  Completed By: Rogers Blocker, PA-C 10/28/2023, 10:40 AM  [facility] Pager: [phone]    Reviewed by: MD:______ RN_______

## 2023-10-28 NOTE — Progress Notes (Signed)
D/C order noted. Contacted RN CM to confirm plans for pt to d/c this am and be transported to [facility] for out-pt HD treatment today which was arranged yesterday. RN CM confirms plans. Contacted clinic to make them aware of the above plan as well. Clinic in need of orders. Contacted renal PA to request that orders be sent to clinic for today's treatment. Clinic is expecting pt today for treatment at 11:30 am. Update provided to inpt HD unit RN as well.   Olivia Canter Renal Navigator [phone]

## 2023-10-28 NOTE — TOC Progression Note (Signed)
Transition of Care (TOC) - Progression Note    Patient Details  Name: BENZINO BUZZETTA Sr. MRN: [id number] Date of Birth: [birth year]  Transition of Care [facility]) CM/SW Contact  Harriet Masson, RN Phone Number: 10/28/2023, 8:47 AM  Clinical Narrative:     Left voicemail with ,Alcide Clever, [facility] requesting a return call for patient apt.  Expected Discharge Plan: Home w Home Health Services Barriers to Discharge: Continued Medical Work up  Expected Discharge Plan and Services In-house Referral: Clinical Social Work Discharge Planning Services: CM Consult Post Acute Care Choice: Home Health Living arrangements for the past 2 months: Single Family Home                 DME Arranged: N/A DME Agency: NA       HH Arranged: NA           Social Determinants of Health (SDOH) Interventions SDOH Screenings   Food Insecurity: No Food Insecurity (10/24/2023)  Housing: Low Risk  (10/24/2023)  Transportation Needs: No Transportation Needs (10/24/2023)  Utilities: Not At Risk (10/24/2023)  Alcohol Screen: Low Risk  (02/04/2022)  Depression (PHQ2-9): Low Risk  (08/06/2022)  Tobacco Use: Low Risk  (10/24/2023)    Readmission Risk Interventions    11/05/2022    3:08 PM  Readmission Risk Prevention Plan  Transportation Screening Complete  Medication Review (RN Care Manager) Complete  PCP or Specialist appointment within 3-5 days of discharge Complete  HRI or Home Care Consult Complete  SW Recovery Care/Counseling Consult Complete  Palliative Care Screening Complete  Skilled Nursing Facility Complete

## 2023-10-29 ENCOUNTER — Telehealth (INDEPENDENT_AMBULATORY_CARE_PROVIDER_SITE_OTHER): Payer: Self-pay | Admitting: Primary Care

## 2023-10-29 DIAGNOSIS — R296 Repeated falls: Secondary | ICD-10-CM | POA: Insufficient documentation

## 2023-10-29 NOTE — Telephone Encounter (Signed)
Was able to call pt and get a sooner apt.

## 2023-10-30 ENCOUNTER — Telehealth (INDEPENDENT_AMBULATORY_CARE_PROVIDER_SITE_OTHER): Payer: Self-pay

## 2023-10-30 NOTE — Telephone Encounter (Signed)
Will be able to give orders when pt sees provider

## 2023-10-30 NOTE — Telephone Encounter (Signed)
Copied from CRM [id number]. Topic: General - Inquiry >> Oct 29, 2023  9:26 AM Lennox Pippins wrote: Steward Drone, from [facility], has called and states that she has received referral from [facility], from when patient was discharged and per Steward Drone, they only ordered PT & OT but feels like patient could benefit from a nurse, if physician will add home health to that order.   Steward Drone is aware he has HFU appointment to establish care with Gwinda Passe on 11/17/2023 and has not seen Gwinda Passe in the past. I have added patient's appt to wait list if anything becomes open any sooner.   Brenda's callback # [phone]

## 2023-11-08 ENCOUNTER — Encounter (HOSPITAL_COMMUNITY): Payer: Self-pay

## 2023-11-08 ENCOUNTER — Emergency Department (HOSPITAL_COMMUNITY): Payer: Medicare Other

## 2023-11-08 ENCOUNTER — Other Ambulatory Visit: Payer: Self-pay

## 2023-11-08 ENCOUNTER — Inpatient Hospital Stay (HOSPITAL_COMMUNITY)
Admission: EM | Admit: 2023-11-08 | Discharge: 2023-12-15 | DRG: 871 | Disposition: A | Discharge status: Skilled Nursing Facility | Payer: Medicare Other | Attending: Internal Medicine | Admitting: Internal Medicine

## 2023-11-08 DIAGNOSIS — A419 Sepsis, unspecified organism: Secondary | ICD-10-CM | POA: Diagnosis present

## 2023-11-08 DIAGNOSIS — E66811 Obesity, class 1: Secondary | ICD-10-CM | POA: Diagnosis present

## 2023-11-08 DIAGNOSIS — I350 Nonrheumatic aortic (valve) stenosis: Secondary | ICD-10-CM

## 2023-11-08 DIAGNOSIS — I132 Hypertensive heart and chronic kidney disease with heart failure and with stage 5 chronic kidney disease, or end stage renal disease: Secondary | ICD-10-CM | POA: Diagnosis present

## 2023-11-08 DIAGNOSIS — K2970 Gastritis, unspecified, without bleeding: Secondary | ICD-10-CM | POA: Diagnosis not present

## 2023-11-08 DIAGNOSIS — G9341 Metabolic encephalopathy: Secondary | ICD-10-CM | POA: Diagnosis present

## 2023-11-08 DIAGNOSIS — A0811 Acute gastroenteropathy due to Norwalk agent: Secondary | ICD-10-CM

## 2023-11-08 DIAGNOSIS — R4182 Altered mental status, unspecified: Secondary | ICD-10-CM | POA: Diagnosis not present

## 2023-11-08 DIAGNOSIS — I5043 Acute on chronic combined systolic (congestive) and diastolic (congestive) heart failure: Secondary | ICD-10-CM | POA: Diagnosis present

## 2023-11-08 DIAGNOSIS — E1165 Type 2 diabetes mellitus with hyperglycemia: Secondary | ICD-10-CM | POA: Diagnosis present

## 2023-11-08 DIAGNOSIS — I77810 Thoracic aortic ectasia: Secondary | ICD-10-CM | POA: Diagnosis present

## 2023-11-08 DIAGNOSIS — I959 Hypotension, unspecified: Secondary | ICD-10-CM | POA: Diagnosis present

## 2023-11-08 DIAGNOSIS — K559 Vascular disorder of intestine, unspecified: Secondary | ICD-10-CM | POA: Diagnosis present

## 2023-11-08 DIAGNOSIS — I10 Essential (primary) hypertension: Secondary | ICD-10-CM | POA: Diagnosis not present

## 2023-11-08 DIAGNOSIS — R4189 Other symptoms and signs involving cognitive functions and awareness: Secondary | ICD-10-CM | POA: Diagnosis present

## 2023-11-08 DIAGNOSIS — Z961 Presence of intraocular lens: Secondary | ICD-10-CM | POA: Diagnosis present

## 2023-11-08 DIAGNOSIS — K921 Melena: Secondary | ICD-10-CM | POA: Diagnosis not present

## 2023-11-08 DIAGNOSIS — E1122 Type 2 diabetes mellitus with diabetic chronic kidney disease: Secondary | ICD-10-CM | POA: Diagnosis present

## 2023-11-08 DIAGNOSIS — E1151 Type 2 diabetes mellitus with diabetic peripheral angiopathy without gangrene: Secondary | ICD-10-CM | POA: Diagnosis present

## 2023-11-08 DIAGNOSIS — I252 Old myocardial infarction: Secondary | ICD-10-CM

## 2023-11-08 DIAGNOSIS — K922 Gastrointestinal hemorrhage, unspecified: Secondary | ICD-10-CM | POA: Diagnosis not present

## 2023-11-08 DIAGNOSIS — K2980 Duodenitis without bleeding: Secondary | ICD-10-CM | POA: Diagnosis not present

## 2023-11-08 DIAGNOSIS — I5020 Unspecified systolic (congestive) heart failure: Secondary | ICD-10-CM | POA: Diagnosis not present

## 2023-11-08 DIAGNOSIS — N186 End stage renal disease: Secondary | ICD-10-CM

## 2023-11-08 DIAGNOSIS — Z992 Dependence on renal dialysis: Secondary | ICD-10-CM | POA: Diagnosis not present

## 2023-11-08 DIAGNOSIS — I428 Other cardiomyopathies: Secondary | ICD-10-CM | POA: Diagnosis present

## 2023-11-08 DIAGNOSIS — I4892 Unspecified atrial flutter: Secondary | ICD-10-CM

## 2023-11-08 DIAGNOSIS — R079 Chest pain, unspecified: Secondary | ICD-10-CM | POA: Diagnosis not present

## 2023-11-08 DIAGNOSIS — Z7982 Long term (current) use of aspirin: Secondary | ICD-10-CM

## 2023-11-08 DIAGNOSIS — K2961 Other gastritis with bleeding: Secondary | ICD-10-CM | POA: Diagnosis not present

## 2023-11-08 DIAGNOSIS — A4189 Other specified sepsis: Secondary | ICD-10-CM | POA: Diagnosis present

## 2023-11-08 DIAGNOSIS — R195 Other fecal abnormalities: Secondary | ICD-10-CM

## 2023-11-08 DIAGNOSIS — Z8673 Personal history of transient ischemic attack (TIA), and cerebral infarction without residual deficits: Secondary | ICD-10-CM | POA: Diagnosis not present

## 2023-11-08 DIAGNOSIS — D500 Iron deficiency anemia secondary to blood loss (chronic): Secondary | ICD-10-CM | POA: Diagnosis not present

## 2023-11-08 DIAGNOSIS — I214 Non-ST elevation (NSTEMI) myocardial infarction: Secondary | ICD-10-CM | POA: Diagnosis not present

## 2023-11-08 DIAGNOSIS — R627 Adult failure to thrive: Secondary | ICD-10-CM | POA: Diagnosis not present

## 2023-11-08 DIAGNOSIS — E875 Hyperkalemia: Secondary | ICD-10-CM | POA: Diagnosis present

## 2023-11-08 DIAGNOSIS — R0902 Hypoxemia: Secondary | ICD-10-CM | POA: Diagnosis present

## 2023-11-08 DIAGNOSIS — E441 Mild protein-calorie malnutrition: Secondary | ICD-10-CM | POA: Diagnosis present

## 2023-11-08 DIAGNOSIS — D509 Iron deficiency anemia, unspecified: Secondary | ICD-10-CM

## 2023-11-08 DIAGNOSIS — Z91199 Patient's noncompliance with other medical treatment and regimen due to unspecified reason: Secondary | ICD-10-CM

## 2023-11-08 DIAGNOSIS — M898X9 Other specified disorders of bone, unspecified site: Secondary | ICD-10-CM | POA: Diagnosis present

## 2023-11-08 DIAGNOSIS — E119 Type 2 diabetes mellitus without complications: Secondary | ICD-10-CM

## 2023-11-08 DIAGNOSIS — R569 Unspecified convulsions: Secondary | ICD-10-CM | POA: Diagnosis not present

## 2023-11-08 DIAGNOSIS — T18128A Food in esophagus causing other injury, initial encounter: Secondary | ICD-10-CM | POA: Diagnosis not present

## 2023-11-08 DIAGNOSIS — R6521 Severe sepsis with septic shock: Secondary | ICD-10-CM | POA: Diagnosis present

## 2023-11-08 DIAGNOSIS — I447 Left bundle-branch block, unspecified: Secondary | ICD-10-CM | POA: Diagnosis present

## 2023-11-08 DIAGNOSIS — L309 Dermatitis, unspecified: Secondary | ICD-10-CM | POA: Diagnosis present

## 2023-11-08 DIAGNOSIS — I255 Ischemic cardiomyopathy: Secondary | ICD-10-CM | POA: Diagnosis present

## 2023-11-08 DIAGNOSIS — J101 Influenza due to other identified influenza virus with other respiratory manifestations: Secondary | ICD-10-CM | POA: Diagnosis present

## 2023-11-08 DIAGNOSIS — D631 Anemia in chronic kidney disease: Secondary | ICD-10-CM | POA: Diagnosis present

## 2023-11-08 DIAGNOSIS — Z882 Allergy status to sulfonamides status: Secondary | ICD-10-CM

## 2023-11-08 DIAGNOSIS — I472 Ventricular tachycardia, unspecified: Secondary | ICD-10-CM | POA: Diagnosis not present

## 2023-11-08 DIAGNOSIS — R197 Diarrhea, unspecified: Secondary | ICD-10-CM

## 2023-11-08 DIAGNOSIS — Z22322 Carrier or suspected carrier of Methicillin resistant Staphylococcus aureus: Secondary | ICD-10-CM

## 2023-11-08 DIAGNOSIS — I5023 Acute on chronic systolic (congestive) heart failure: Secondary | ICD-10-CM | POA: Diagnosis present

## 2023-11-08 DIAGNOSIS — Z6832 Body mass index (BMI) 32.0-32.9, adult: Secondary | ICD-10-CM

## 2023-11-08 DIAGNOSIS — I890 Lymphedema, not elsewhere classified: Secondary | ICD-10-CM | POA: Diagnosis not present

## 2023-11-08 DIAGNOSIS — R946 Abnormal results of thyroid function studies: Secondary | ICD-10-CM | POA: Diagnosis present

## 2023-11-08 DIAGNOSIS — Z515 Encounter for palliative care: Secondary | ICD-10-CM | POA: Diagnosis not present

## 2023-11-08 DIAGNOSIS — I953 Hypotension of hemodialysis: Secondary | ICD-10-CM | POA: Diagnosis not present

## 2023-11-08 DIAGNOSIS — N2581 Secondary hyperparathyroidism of renal origin: Secondary | ICD-10-CM | POA: Diagnosis present

## 2023-11-08 DIAGNOSIS — Z79899 Other long term (current) drug therapy: Secondary | ICD-10-CM

## 2023-11-08 DIAGNOSIS — Z8249 Family history of ischemic heart disease and other diseases of the circulatory system: Secondary | ICD-10-CM

## 2023-11-08 DIAGNOSIS — E669 Obesity, unspecified: Secondary | ICD-10-CM | POA: Diagnosis present

## 2023-11-08 DIAGNOSIS — Z794 Long term (current) use of insulin: Secondary | ICD-10-CM | POA: Diagnosis not present

## 2023-11-08 DIAGNOSIS — G903 Multi-system degeneration of the autonomic nervous system: Secondary | ICD-10-CM | POA: Diagnosis not present

## 2023-11-08 DIAGNOSIS — I2510 Atherosclerotic heart disease of native coronary artery without angina pectoris: Secondary | ICD-10-CM | POA: Diagnosis not present

## 2023-11-08 DIAGNOSIS — I9589 Other hypotension: Secondary | ICD-10-CM | POA: Diagnosis not present

## 2023-11-08 DIAGNOSIS — T182XXA Foreign body in stomach, initial encounter: Secondary | ICD-10-CM | POA: Diagnosis not present

## 2023-11-08 DIAGNOSIS — Z89511 Acquired absence of right leg below knee: Secondary | ICD-10-CM

## 2023-11-08 DIAGNOSIS — Z66 Do not resuscitate: Secondary | ICD-10-CM | POA: Diagnosis not present

## 2023-11-08 DIAGNOSIS — D6832 Hemorrhagic disorder due to extrinsic circulating anticoagulants: Secondary | ICD-10-CM | POA: Diagnosis not present

## 2023-11-08 DIAGNOSIS — G039 Meningitis, unspecified: Secondary | ICD-10-CM | POA: Diagnosis not present

## 2023-11-08 DIAGNOSIS — E785 Hyperlipidemia, unspecified: Secondary | ICD-10-CM | POA: Diagnosis present

## 2023-11-08 DIAGNOSIS — E7800 Pure hypercholesterolemia, unspecified: Secondary | ICD-10-CM | POA: Diagnosis not present

## 2023-11-08 DIAGNOSIS — G9340 Encephalopathy, unspecified: Secondary | ICD-10-CM | POA: Diagnosis not present

## 2023-11-08 DIAGNOSIS — I2583 Coronary atherosclerosis due to lipid rich plaque: Secondary | ICD-10-CM | POA: Diagnosis not present

## 2023-11-08 DIAGNOSIS — T45525A Adverse effect of antithrombotic drugs, initial encounter: Secondary | ICD-10-CM | POA: Diagnosis not present

## 2023-11-08 HISTORY — DX: Obesity, unspecified: E66.9

## 2023-11-08 HISTORY — DX: Unspecified systolic (congestive) heart failure: I50.20

## 2023-11-08 HISTORY — DX: Type 2 diabetes mellitus without complications: E11.9

## 2023-11-08 HISTORY — DX: Atherosclerotic heart disease of native coronary artery without angina pectoris: I25.10

## 2023-11-08 HISTORY — DX: Chronic systolic (congestive) heart failure: I50.22

## 2023-11-08 LAB — COMPREHENSIVE METABOLIC PANEL
ALT: 34 U/L (ref 0–44)
AST: 44 U/L — ABNORMAL HIGH (ref 15–41)
Albumin: 3.3 g/dL — ABNORMAL LOW (ref 3.5–5.0)
Alkaline Phosphatase: 127 U/L — ABNORMAL HIGH (ref 38–126)
Anion gap: 13 (ref 5–15)
BUN: 28 mg/dL — ABNORMAL HIGH (ref 8–23)
CO2: 26 mmol/L (ref 22–32)
Calcium: 10.3 mg/dL (ref 8.9–10.3)
Chloride: 91 mmol/L — ABNORMAL LOW (ref 98–111)
Creatinine, Ser: 7.5 mg/dL — ABNORMAL HIGH (ref 0.61–1.24)
GFR, Estimated: 8 mL/min — ABNORMAL LOW (ref >60)
Glucose, Bld: 290 mg/dL — ABNORMAL HIGH (ref 70–99)
Potassium: 4.5 mmol/L (ref 3.5–5.1)
Sodium: 130 mmol/L — ABNORMAL LOW (ref 135–145)
Total Bilirubin: 0.6 mg/dL (ref <1.2)
Total Protein: 8.3 g/dL — ABNORMAL HIGH (ref 6.5–8.1)

## 2023-11-08 LAB — APTT: aPTT: 28 s (ref 24–36)

## 2023-11-08 LAB — CBC WITH DIFFERENTIAL/PLATELET
Abs Immature Granulocytes: 0.02 10*3/uL (ref 0.00–0.07)
Basophils Absolute: 0.1 10*3/uL (ref 0.0–0.1)
Basophils Relative: 1 %
Eosinophils Absolute: 0.2 10*3/uL (ref 0.0–0.5)
Eosinophils Relative: 3 %
HCT: 44.6 % (ref 39.0–52.0)
Hemoglobin: 14.4 g/dL (ref 13.0–17.0)
Immature Granulocytes: 0 %
Lymphocytes Relative: 19 %
Lymphs Abs: 1.2 10*3/uL (ref 0.7–4.0)
MCH: 29.6 pg (ref 26.0–34.0)
MCHC: 32.3 g/dL (ref 30.0–36.0)
MCV: 91.6 fL (ref 80.0–100.0)
Monocytes Absolute: 1.1 10*3/uL — ABNORMAL HIGH (ref 0.1–1.0)
Monocytes Relative: 18 %
Neutro Abs: 3.7 10*3/uL (ref 1.7–7.7)
Neutrophils Relative %: 59 %
Platelets: 176 10*3/uL (ref 150–400)
RBC: 4.87 MIL/uL (ref 4.22–5.81)
RDW: 15 % (ref 11.5–15.5)
WBC: 6.3 10*3/uL (ref 4.0–10.5)
nRBC: 0 % (ref 0.0–0.2)

## 2023-11-08 LAB — PROTIME-INR
INR: 1.2 (ref 0.8–1.2)
Prothrombin Time: 15.2 s (ref 11.4–15.2)

## 2023-11-08 LAB — TROPONIN I (HIGH SENSITIVITY)
Troponin I (High Sensitivity): 7689 ng/L (ref <18)
Troponin I (High Sensitivity): 7744 ng/L (ref <18)

## 2023-11-08 MED ORDER — SODIUM CHLORIDE 0.9 % IV BOLUS
500.0000 mL | Freq: Once | INTRAVENOUS | Status: AC
  Administered 2023-11-08: 500 mL via INTRAVENOUS

## 2023-11-08 MED ORDER — HEPARIN BOLUS VIA INFUSION
4000.0000 [IU] | Freq: Once | INTRAVENOUS | Status: AC
  Administered 2023-11-08: 4000 [IU] via INTRAVENOUS

## 2023-11-08 MED ORDER — HEPARIN (PORCINE) 25000 UT/250ML-% IV SOLN
1600.0000 [IU]/h | INTRAVENOUS | Status: DC
Start: 2023-11-08 — End: 2023-11-11
  Administered 2023-11-08: 1300 [IU]/h via INTRAVENOUS
  Administered 2023-11-10: 1550 [IU]/h via INTRAVENOUS
  Administered 2023-11-10: 1600 [IU]/h via INTRAVENOUS
  Filled 2023-11-08 (×4): qty 250

## 2023-11-08 MED ORDER — ASPIRIN 325 MG PO TABS
325.0000 mg | ORAL_TABLET | Freq: Once | ORAL | Status: AC
  Administered 2023-11-08: 325 mg via ORAL
  Filled 2023-11-08: qty 1

## 2023-11-08 MED ORDER — IOHEXOL 350 MG/ML SOLN
75.0000 mL | Freq: Once | INTRAVENOUS | Status: AC | PRN
  Administered 2023-11-08: 75 mL via INTRAVENOUS

## 2023-11-08 NOTE — ED Provider Notes (Signed)
[place] EMERGENCY DEPARTMENT AT [facility] Provider Note   CSN: [id number] Arrival date & time: 11/08/23  1430     History  Chief Complaint  Patient presents with   Hypotension   Cough   Shortness of Breath    [name] KOS Sr. is a 62 y.o. male.   Cough Associated symptoms: shortness of breath   Shortness of Breath Associated symptoms: cough and vomiting    Patient is a 62 year old male who presents to the ED with a 6-hour history of cough, shortness of breath happening during dialysis.  Previous medical history of ESRD/dialysis MWF, type 2 diabetes, CHF, CVA, CAD, HTN, s/p R BKA.  Patient states that he had 1 hour left of dialysis when he began to feel dizzy and coughing without despite.  This has been happening since with 1 episode of vomiting happening 15 minutes ago due to the cough.  Not making urine.  Denies dizziness, shortness of breath currently, chest pain, abdominal pain, nausea, constipation, diarrhea.     Home Medications Prior to Admission medications   Medication Sig Start Date End Date Taking? Authorizing Provider  acetaminophen (TYLENOL) 500 MG tablet Take 1,000 mg by mouth 2 (two) times daily as needed for moderate pain (pain score 4-6), headache or fever.    [provider]  B Complex-C-Folic Acid (DIALYVITE 800) 0.8 MG TABS Take 1 tablet by mouth daily.    [provider]  cinacalcet (SENSIPAR) 30 MG tablet Take 30 mg by mouth daily.    [provider]  insulin isophane & regular human KwikPen (NOVOLIN 70/30 KWIKPEN) (70-30) 100 UNIT/ML KwikPen Inject 30U in the morning and 25U in the evening 10/27/23   Briant Cedar, MD  Insulin Pen Needle 32G X 4 MM MISC Use with insulin pen. 10/28/23   Briant Cedar, MD  lidocaine-prilocaine (EMLA) cream Apply 1 Application topically Every Tuesday,Thursday,and Saturday with dialysis.    [provider]  sevelamer carbonate (RENVELA) 800 MG tablet Take  2,400-4,000 mg by mouth See admin instructions. Take 5 tablets (4000mg ) three times daily with meals and 3 tablets (2400mg ) twice daily with snacks.    [provider]      Allergies    Sulfa antibiotics    Review of Systems   Review of Systems  Respiratory:  Positive for cough and shortness of breath.   Gastrointestinal:  Positive for vomiting.  Neurological:  Positive for dizziness.  All other systems reviewed and are negative.   Physical Exam Updated Vital Signs BP (!) 89/52   Pulse 88   Temp 98.7 F (37.1 C) (Oral)   Resp (!) 31   Ht 6\' 1"  (1.854 m)   Wt 113.4 kg   SpO2 90%   BMI 32.98 kg/m  Physical Exam Vitals and nursing note reviewed.  Constitutional:      General: He is not in acute distress.    Appearance: Normal appearance.  HENT:     Head: Normocephalic and atraumatic.     Mouth/Throat:     Mouth: Mucous membranes are moist.     Pharynx: Oropharynx is clear. No pharyngeal swelling.  Eyes:     Extraocular Movements: Extraocular movements intact.     Conjunctiva/sclera: Conjunctivae normal.  Cardiovascular:     Rate and Rhythm: Normal rate and regular rhythm.     Pulses: Normal pulses.     Heart sounds: Murmur heard.  Pulmonary:     Effort: Pulmonary effort is normal. No accessory muscle  usage or respiratory distress.  Chest:     Chest wall: No tenderness or edema.  Abdominal:     General: Abdomen is flat.     Palpations: Abdomen is soft. There is no hepatomegaly.     Tenderness: There is no abdominal tenderness. There is no guarding.  Musculoskeletal:     Left lower leg: No tenderness. No edema.     Comments: Right BKA    Lymphadenopathy:     Cervical: No cervical adenopathy.  Skin:    General: Skin is warm and dry.  Neurological:     General: No focal deficit present.     Mental Status: He is alert. Mental status is at baseline.  Psychiatric:     Comments: Patient seems to be adverse to answering questions.  Providing little  information and seemingly does not know much about his current medical condition.     ED Results / Procedures / Treatments   Labs (all labs ordered are listed, but only abnormal results are displayed) Labs Reviewed  CBC WITH DIFFERENTIAL/PLATELET - Abnormal; Notable for the following components:      Result Value   Monocytes Absolute 1.1 (*)    All other components within normal limits  COMPREHENSIVE METABOLIC PANEL - Abnormal; Notable for the following components:   Sodium 130 (*)    Chloride 91 (*)    Glucose, Bld 290 (*)    BUN 28 (*)    Creatinine, Ser 7.50 (*)    Total Protein 8.3 (*)    Albumin 3.3 (*)    AST 44 (*)    Alkaline Phosphatase 127 (*)    GFR, Estimated 8 (*)    All other components within normal limits  TROPONIN I (HIGH SENSITIVITY) - Abnormal; Notable for the following components:   Troponin I (High Sensitivity) 1,610 (*)    All other components within normal limits  TROPONIN I (HIGH SENSITIVITY) - Abnormal; Notable for the following components:   Troponin I (High Sensitivity) 7,744 (*)    All other components within normal limits  APTT  PROTIME-INR  HEPARIN LEVEL (UNFRACTIONATED)  CBC    EKG EKG Interpretation Date/Time:  Sunday November 08 2023 14:45:23 EST Ventricular Rate:  80 PR Interval:    QRS Duration:  141 QT Interval:  444 QTC Calculation: 513 R Axis:   -72  Text Interpretation: Atrial flutter with predominant 3:1 AV block Nonspecific IVCD with LAD Left ventricular hypertrophy Anterior infarct, old Abnormal T, consider ischemia, lateral leads similar to prior EKG'  Confirmed by Eber Hong ([id number]) on 11/08/2023 3:03:35 PM  Radiology DG Chest 2 View Result Date: 11/08/2023 CLINICAL DATA:  Shortness of breath. EXAM: CHEST - 2 VIEW COMPARISON:  CT scan, same date. FINDINGS: The heart is enlarged but appears stable. Mild central vascular congestion without overt pulmonary edema. No definite infiltrates or effusions. The bony thorax is  intact. IMPRESSION: Cardiomegaly and mild central vascular congestion. Electronically Signed   By: Rudie Meyer M.D.   On: 11/08/2023 18:55   CT Angio Chest PE W and/or Wo Contrast Result Date: 11/08/2023 CLINICAL DATA:  Shortness of breath at dialysis.  Hypotension. EXAM: CT ANGIOGRAPHY CHEST WITH CONTRAST TECHNIQUE: Multidetector CT imaging of the chest was performed using the standard protocol during bolus administration of intravenous contrast. Multiplanar CT image reconstructions and MIPs were obtained to evaluate the vascular anatomy. RADIATION DOSE REDUCTION: This exam was performed according to the departmental dose-optimization program which includes automated exposure control, adjustment of the mA and/or kV  according to patient size and/or use of iterative reconstruction technique. CONTRAST:  75mL OMNIPAQUE IOHEXOL 350 MG/ML SOLN COMPARISON:  CT scan 10/19/2022 FINDINGS: Cardiovascular: The heart is enlarged but appears stable. No pericardial effusion. Significant age advanced atherosclerotic calcification involving the aorta and coronary arteries. No aortic dissection or aneurysm. The pulmonary arterial tree is fairly well opacified. No filling defects to suggest pulmonary embolism. There are extensive right-sided chest wall collaterals. 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 Mediastinum/Nodes: No mediastinal or hilar mass or lymphadenopathy. Scattered lymph nodes are stable. The esophagus is grossly normal. Lungs/Pleura: Mild dependent edema/atelectasis but no infiltrates or effusions. No worrisome pulmonary lesions or pulmonary nodules. The central tracheobronchial tree is unremarkable. Upper Abdomen: Significant age advanced vascular disease. No aneurysm or dissection. No upper abdominal mass or adenopathy. Musculoskeletal: Remote ununited sternal fracture. No acute bony findings. Review of the MIP images confirms the above findings. IMPRESSION: 1. No CT findings for pulmonary  embolism. 2. Significant age advanced vascular disease. 3. No acute pulmonary findings or worrisome pulmonary lesions. 4. 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 5. Remote ununited sternal fracture. 6. Aortic atherosclerosis. Aortic Atherosclerosis (ICD10-I70.0). Electronically Signed   By: Rudie Meyer M.D.   On: 11/08/2023 18:55    Procedures .Critical Care  Performed by: Lunette Stands, PA-C Authorized by: Lunette Stands, PA-C   Critical care provider statement:    Critical care time (minutes):  55   Critical care time was exclusive of:  Separately billable procedures and treating other patients   Critical care was necessary to treat or prevent imminent or life-threatening deterioration of the following conditions:  Cardiac failure   Critical care was time spent personally by me on the following activities:  Development of treatment plan with patient or surrogate, discussions with consultants, evaluation of patient's response to treatment, examination of patient, ordering and review of laboratory studies, ordering and review of radiographic studies, ordering and performing treatments and interventions, pulse oximetry, re-evaluation of patient's condition, review of old charts and obtaining history from patient or surrogate   Care discussed with: accepting provider at another facility       Medications Ordered in ED Medications  heparin ADULT infusion 100 units/mL (25000 units/244mL) (1,300 Units/hr Intravenous New Bag/Given 11/08/23 1850)  sodium chloride 0.9 % bolus 500 mL (0 mLs Intravenous Stopped 11/08/23 1939)  iohexol (OMNIPAQUE) 350 MG/ML injection 75 mL (75 mLs Intravenous Contrast Given 11/08/23 1802)  heparin bolus via infusion 4,000 Units (4,000 Units Intravenous Bolus from Bag 11/08/23 1850)  aspirin tablet 325 mg (325 mg Oral Given 11/08/23 1838)    ED Course/ Medical Decision Making/ A&P   {            HEART Score:  7                    Medical Decision Making Amount and/or Complexity of Data Reviewed Labs: ordered. Radiology: ordered.  Risk OTC drugs. Prescription drug management. Decision regarding hospitalization.   This patient is a 62 year old male who presents to the ED for concern of shortness of breath, cough, near syncope.   Differential diagnoses prior to evaluation: The emergent differential diagnosis includes, but is not limited to, ACS, CHF exacerbation, PE, pneumonia, URI, pulmonary hypertension, pneumothorax, pleural effusion. This is not an exhaustive differential.   Past Medical History / Co-morbidities / Social History: ESRD/on hemodialysis Monday Wednesday Friday, CHF, type 2 diabetes, CVA, CAD.   Additional history: Chart  reviewed. Pertinent results include:   Last echo was on 10/25/2023 showing EF 45 to 50%, mildly reduced RV function, elevated pulmonary artery pressure, degenerative mitral valve with regurg, tricuspid regurg moderate, aortic valve calcified with stenosis mild to moderate, IVC is dilated suggesting right atrial pressure of 15 mmHg.   Previously discharged from the hospital after being treated for HHS discovered post fall.  Discharged to SNF.  CT chest 10/2022 showed calcified plaque over left main and three-vessel coronary arteries.   Lab Tests/Imaging studies: I personally interpreted labs/imaging and the pertinent results include:   Troponin 7689, delta 7744 CBC unremarkable CMP unremarkable compared to prior labs aPTT 28 PT 15.2 INR 1.2 Chest x-ray showed cardiomegaly and central vascular congestion CT angiogram showed remote ununited sternal fracture and remote chest wall collaterals I agree with the radiologist interpretation.  Cardiac monitoring: EKG obtained and interpreted by myself and attending physician which shows: A flutter   Medications: I ordered medication including ASA, heparin, normal saline.  I have reviewed the patients home  medicines and have made adjustments as needed.  Critical Interventions: ASA and heparin provided for NSTEMI    ED Course:  Patient was a 62 year old male who presented to the ED after dialysis with 1 hour left ago, complaining of dizziness, shortness of breath, cough.  Patient did not seem cooperative to answering questions.  Had high suspicion for ACS, PE.  Patient was noted to be hypoxic with tachypnea on exam satting around 87 to 88%.  Patient was placed on 2 L of O2 and responded well satting at 94 to 97%.  ECG showed a flutter.  Troponin was significantly elevated at 7689 with a delta of 7744.  Chest x-ray showed cardiomegaly and central vascular congestion.  ASA and heparin order were then placed for NSTEMI.  Attending was consulted and active in the treatment process.  Patient was also given 500 mL normal saline due to blood pressure being high 80s systolic.  Cardiology was consulted and spoke with attending.  Cardiology agreed that once bed is open up at [facility], patient can be sent there for further evaluation by cardiology.  Patient would benefit from admission and continued monitoring by cardiology.    Disposition: After consideration of the diagnostic results and the patients response to treatment, I feel that patient would benefit from transfer to [facility].  Care transferred to Dr. Derrell Lolling, while awaiting bed at [facility].  Will be sent with cardiac monitoring.    Final Clinical Impression(s) / ED Diagnoses Final diagnoses:  NSTEMI (non-ST elevated myocardial infarction) [facility])    Rx / DC Orders ED Discharge Orders     None         Lavonia Drafts 11/08/23 2310    Eber Hong, MD 11/09/23 [phone]

## 2023-11-08 NOTE — Consult Note (Addendum)
PHARMACY - ANTICOAGULATION CONSULT NOTE  Pharmacy Consult for Heparin infusion Indication: chest pain/ACS  Allergies  Allergen Reactions   Sulfa Antibiotics Diarrhea and Nausea And Vomiting    Patient Measurements: Height: 6\' 1"  (185.4 cm) Weight: 113.4 kg (250 lb) IBW/kg (Calculated) : 79.9 Heparin Dosing Weight: 103.9 kg  Vital Signs: Temp: 98.7 F (37.1 C) (12/22 1453) Temp Source: Oral (12/22 1453) BP: 86/61 (12/22 1545) Pulse Rate: 79 (12/22 1545)  Labs: Recent Labs    11/08/23 1658  HGB 14.4  HCT 44.6  PLT 176  CREATININE 7.50*  TROPONINIHS 7,689*    Estimated Creatinine Clearance: 13.6 mL/min (A) (by C-G formula based on SCr of 7.5 mg/dL (H)).   Medical History: Past Medical History:  Diagnosis Date   CHF (congestive heart failure) (HCC)    ECHO 02/05/22  EF 45-50%, DD indeterminent   Diabetes mellitus without complication (HCC)    type 2   ESRD on hemodialysis (HCC)    mon wed fri   Hypertension    Kidney failure    Myocardial infarction (HCC)    Nonischemic cardiomyopathy (HCC)     Medications:  No AC prior to admission per med list and fill history.  Baseline Hgb = 14.4, Plt 176  Baseline aPTT and INR - ordered.   Assessment: Patient brought to ED by EMS for near syncope and shortness of breath during HD. ESRD on dialysis TTS, chronic HFmrEF, insulin-dependent diabetes, hypertension, PVD status post right BKA, history of stroke, hypergammaglobulinemia, hyperferritinemia. Pharmacy consulted to initiate and manage heparin infusion for ACS.  Goal of Therapy:  Heparin level 0.3-0.7 units/ml Monitor platelets by anticoagulation protocol: Yes   Plan:  Give 4000 units bolus x 1 Start heparin infusion at 1300 units/hr Check anti-Xa level in 8 hours and daily while on heparin Continue to monitor H&H and platelets  Ritisha Deitrick Rodriguez-Guzman PharmD, BCPS 11/08/2023 6:06 PM

## 2023-11-08 NOTE — ED Provider Notes (Signed)
Patient is an ill-appearing 62 year old male status post amputation of his lower extremity, on dialysis for several years with his access in the left upper extremity.  He was at dialysis today, he did not finish, he started having some chest pain and had hypotension with a near syncopal episode.  The patient is short of breath and has a chronic cough which she states has been going on for about 14 years.  His pressure improved by the time of arrival but while he was in the emergency department his blood pressure dropped again down to about 80/60.  The patient does not appear to be in distress other than having a chronic cough, his lung sounds sound a little bit rhonchorous and some subtle rales which seem to clear with deep breathing, his oxygen drops to 88 to 89% when he lays down and when he sits up he gets better.  His EKG is abnormal with a bundle branch block pathology and what appears to be some ST abnormalities although they do not appear to be necessarily new as when I look at old EKGs I see similar patterns.  The patient has abnormal labs as expected with his renal function but also has a troponin of over 7000.  I personally looked at his prior history and there is no history of coronary evaluation in our system however he has had an echocardiogram as recently as 2 weeks ago which showed a reduced ejection fraction and grade 2 diastolic failure.  I personally viewed the x-ray of the chest which shows cardiomegaly, CT angiogram of the chest was evaluated for pulmonary embolism given the near syncope and the hypotension, thankfully I do not see any signs of pulmonary embolism.  I am certainly concerned about acute coronary syndrome and cardiology will be consulted, critical care provided, heparin will be started, the patient will be transferred to cardiac center  Consultation: Dr. Derrell Lolling with cardiology - accepts patient to [facility] hospital.  Already had monitoring: Normal sinus rhythm  .Critical  Care  Performed by: Eber Hong, MD Authorized by: Eber Hong, MD   Critical care provider statement:    Critical care time (minutes):  30   Critical care time was exclusive of:  Separately billable procedures and treating other patients and teaching time   Critical care was necessary to treat or prevent imminent or life-threatening deterioration of the following conditions:  Cardiac failure   Critical care was time spent personally by me on the following activities:  Development of treatment plan with patient or surrogate, discussions with consultants, evaluation of patient's response to treatment, examination of patient, ordering and review of laboratory studies, ordering and review of radiographic studies, ordering and performing treatments and interventions, pulse oximetry, re-evaluation of patient's condition, review of old charts and obtaining history from patient or surrogate   I assumed direction of critical care for this patient from another provider in my specialty: no     Care discussed with: admitting provider and accepting provider at another facility   Comments:        Final diagnoses:  NSTEMI (non-ST elevated myocardial infarction) [facility])      Eber Hong, MD 11/09/23 [phone]

## 2023-11-08 NOTE — ED Triage Notes (Signed)
Pt brought by [name] from home for near syncope and shortness of breath at dialysis and EMS discovered patient had hypotension with a blood pressure below. Pt has had a cough for fourteen years, but it has been exacerbated today. Pt also complains of shortness of breath.

## 2023-11-09 ENCOUNTER — Other Ambulatory Visit (HOSPITAL_COMMUNITY): Payer: Self-pay | Admitting: *Deleted

## 2023-11-09 ENCOUNTER — Encounter (HOSPITAL_COMMUNITY): Payer: Self-pay | Admitting: General Surgery

## 2023-11-09 ENCOUNTER — Inpatient Hospital Stay (HOSPITAL_COMMUNITY): Payer: Medicare Other

## 2023-11-09 DIAGNOSIS — I214 Non-ST elevation (NSTEMI) myocardial infarction: Secondary | ICD-10-CM

## 2023-11-09 DIAGNOSIS — Z794 Long term (current) use of insulin: Secondary | ICD-10-CM

## 2023-11-09 DIAGNOSIS — R079 Chest pain, unspecified: Secondary | ICD-10-CM | POA: Diagnosis not present

## 2023-11-09 DIAGNOSIS — E441 Mild protein-calorie malnutrition: Secondary | ICD-10-CM

## 2023-11-09 DIAGNOSIS — G9341 Metabolic encephalopathy: Secondary | ICD-10-CM

## 2023-11-09 DIAGNOSIS — N186 End stage renal disease: Secondary | ICD-10-CM | POA: Diagnosis not present

## 2023-11-09 DIAGNOSIS — E669 Obesity, unspecified: Secondary | ICD-10-CM

## 2023-11-09 DIAGNOSIS — I5023 Acute on chronic systolic (congestive) heart failure: Secondary | ICD-10-CM | POA: Diagnosis not present

## 2023-11-09 DIAGNOSIS — I10 Essential (primary) hypertension: Secondary | ICD-10-CM

## 2023-11-09 DIAGNOSIS — Z992 Dependence on renal dialysis: Secondary | ICD-10-CM

## 2023-11-09 DIAGNOSIS — E1122 Type 2 diabetes mellitus with diabetic chronic kidney disease: Secondary | ICD-10-CM

## 2023-11-09 DIAGNOSIS — Z8673 Personal history of transient ischemic attack (TIA), and cerebral infarction without residual deficits: Secondary | ICD-10-CM

## 2023-11-09 LAB — MRSA NEXT GEN BY PCR, NASAL: MRSA by PCR Next Gen: DETECTED — AB

## 2023-11-09 LAB — ECHOCARDIOGRAM COMPLETE
AR max vel: 1.04 cm2
AV Area VTI: 0.98 cm2
AV Area mean vel: 0.96 cm2
AV Mean grad: 22 mm[Hg]
AV Peak grad: 32 mm[Hg]
Ao pk vel: 2.83 m/s
Area-P 1/2: 6.17 cm2
Calc EF: 39.8 %
Height: 73 in
MV M vel: 4.81 m/s
MV Peak grad: 92.5 mm[Hg]
S' Lateral: 4.8 cm
Single Plane A2C EF: 42.2 %
Single Plane A4C EF: 39.7 %
Weight: 4000 [oz_av]

## 2023-11-09 LAB — TROPONIN I (HIGH SENSITIVITY)
Troponin I (High Sensitivity): 8442 ng/L (ref <18)
Troponin I (High Sensitivity): 8849 ng/L (ref <18)

## 2023-11-09 LAB — HEPARIN LEVEL (UNFRACTIONATED)
Heparin Unfractionated: 0.1 [IU]/mL — ABNORMAL LOW (ref 0.30–0.70)
Heparin Unfractionated: 0.39 [IU]/mL (ref 0.30–0.70)
Heparin Unfractionated: 0.45 [IU]/mL (ref 0.30–0.70)

## 2023-11-09 LAB — CBC
HCT: 44.2 % (ref 39.0–52.0)
HCT: 47.9 % (ref 39.0–52.0)
Hemoglobin: 14 g/dL (ref 13.0–17.0)
Hemoglobin: 15.1 g/dL (ref 13.0–17.0)
MCH: 29.2 pg (ref 26.0–34.0)
MCH: 29 pg (ref 26.0–34.0)
MCHC: 31.7 g/dL (ref 30.0–36.0)
MCHC: 31.5 g/dL (ref 30.0–36.0)
MCV: 92.1 fL (ref 80.0–100.0)
MCV: 91.9 fL (ref 80.0–100.0)
Platelets: 170 10*3/uL (ref 150–400)
Platelets: 173 10*3/uL (ref 150–400)
RBC: 4.8 MIL/uL (ref 4.22–5.81)
RBC: 5.21 MIL/uL (ref 4.22–5.81)
RDW: 15.4 % (ref 11.5–15.5)
RDW: 15.6 % — ABNORMAL HIGH (ref 11.5–15.5)
WBC: 7 10*3/uL (ref 4.0–10.5)
WBC: 8.1 10*3/uL (ref 4.0–10.5)
nRBC: 0 % (ref 0.0–0.2)
nRBC: 0 % (ref 0.0–0.2)

## 2023-11-09 LAB — GLUCOSE, CAPILLARY
Glucose-Capillary: 244 mg/dL — ABNORMAL HIGH (ref 70–99)
Glucose-Capillary: 276 mg/dL — ABNORMAL HIGH (ref 70–99)

## 2023-11-09 LAB — CREATININE, SERUM
Creatinine, Ser: 10.49 mg/dL — ABNORMAL HIGH (ref 0.61–1.24)
GFR, Estimated: 5 mL/min — ABNORMAL LOW (ref >60)

## 2023-11-09 LAB — HEPATITIS B SURFACE ANTIGEN: Hepatitis B Surface Ag: NONREACTIVE

## 2023-11-09 MED ORDER — HEPARIN BOLUS VIA INFUSION
2000.0000 [IU] | Freq: Once | INTRAVENOUS | Status: AC
Start: 2023-11-09 — End: 2023-11-09
  Administered 2023-11-09: 2000 [IU] via INTRAVENOUS

## 2023-11-09 MED ORDER — ONDANSETRON HCL 4 MG PO TABS
4.0000 mg | ORAL_TABLET | Freq: Four times a day (QID) | ORAL | Status: DC | PRN
Start: 2023-11-09 — End: 2023-12-15

## 2023-11-09 MED ORDER — DOXERCALCIFEROL 4 MCG/2ML IV SOLN
6.0000 ug | INTRAVENOUS | Status: DC
  Administered 2023-11-10 – 2023-11-14 (×2): 6 ug via INTRAVENOUS
  Filled 2023-11-09 (×4): qty 4

## 2023-11-09 MED ORDER — HEPARIN SODIUM (PORCINE) 5000 UNIT/ML IJ SOLN: 5000.0000 [IU] | Freq: Three times a day (TID) | INTRAMUSCULAR | Status: DC

## 2023-11-09 MED ORDER — CINACALCET HCL 30 MG PO TABS
30.0000 mg | ORAL_TABLET | Freq: Every day | ORAL | Status: DC
  Administered 2023-11-11 – 2023-12-15 (×29): 30 mg via ORAL
  Filled 2023-11-09 (×33): qty 1

## 2023-11-09 MED ORDER — ATORVASTATIN CALCIUM 40 MG PO TABS
40.0000 mg | ORAL_TABLET | Freq: Every day | ORAL | Status: DC
  Administered 2023-11-09 – 2023-12-15 (×31): 40 mg via ORAL
  Filled 2023-11-09 (×32): qty 1

## 2023-11-09 MED ORDER — ACETAMINOPHEN 325 MG PO TABS
650.0000 mg | ORAL_TABLET | Freq: Four times a day (QID) | ORAL | Status: DC | PRN
  Administered 2023-11-10 – 2023-12-15 (×11): 650 mg via ORAL
  Filled 2023-11-09 (×14): qty 2

## 2023-11-09 MED ORDER — INSULIN ASPART 100 UNIT/ML IJ SOLN
0.0000 [IU] | Freq: Three times a day (TID) | INTRAMUSCULAR | Status: DC
  Administered 2023-11-09 – 2023-11-10 (×2): 2 [IU] via SUBCUTANEOUS
  Administered 2023-11-10: 1 [IU] via SUBCUTANEOUS
  Administered 2023-11-11: 3 [IU] via SUBCUTANEOUS
  Administered 2023-11-11: 2 [IU] via SUBCUTANEOUS
  Administered 2023-11-11: 3 [IU] via SUBCUTANEOUS
  Administered 2023-11-12: 1 [IU] via SUBCUTANEOUS
  Administered 2023-11-12: 4 [IU] via SUBCUTANEOUS
  Administered 2023-11-12: 3 [IU] via SUBCUTANEOUS
  Administered 2023-11-13 (×3): 2 [IU] via SUBCUTANEOUS

## 2023-11-09 MED ORDER — ASPIRIN 81 MG PO CHEW
81.0000 mg | CHEWABLE_TABLET | Freq: Every day | ORAL | Status: DC
  Administered 2023-11-09 – 2023-11-27 (×14): 81 mg via ORAL
  Filled 2023-11-09 (×14): qty 1

## 2023-11-09 MED ORDER — SEVELAMER CARBONATE 800 MG PO TABS
4000.0000 mg | ORAL_TABLET | Freq: Three times a day (TID) | ORAL | Status: DC
  Administered 2023-11-11 – 2023-11-14 (×7): 4000 mg via ORAL
  Administered 2023-11-15: 800 mg via ORAL
  Administered 2023-11-15 – 2023-12-15 (×60): 4000 mg via ORAL
  Filled 2023-11-09 (×78): qty 5

## 2023-11-09 MED ORDER — ACETAMINOPHEN 650 MG RE SUPP
650.0000 mg | Freq: Four times a day (QID) | RECTAL | Status: DC | PRN
  Administered 2023-11-10 (×2): 650 mg via RECTAL
  Filled 2023-11-09 (×2): qty 1

## 2023-11-09 MED ORDER — ONDANSETRON HCL 4 MG/2ML IJ SOLN
4.0000 mg | Freq: Four times a day (QID) | INTRAMUSCULAR | Status: DC | PRN
  Filled 2023-11-09 (×2): qty 2

## 2023-11-09 MED ORDER — SEVELAMER CARBONATE 800 MG PO TABS: 2400.0000 mg | ORAL_TABLET | Freq: Two times a day (BID) | ORAL | Status: DC | PRN

## 2023-11-09 NOTE — Assessment & Plan Note (Signed)
Consult nutrition for recommendations.

## 2023-11-09 NOTE — Consult Note (Addendum)
Cardiology Consult    Patient ID: [name] ZEPHIR Sr. MRN: [id number], DOB/AGE: [birth year]   Admit date: 11/08/2023 Date of Consult: 11/09/2023  Primary Physician: Oneita Hurt, No Primary Cardiologist: None - prev evaluated at [facility] Requesting Provider: Jeraldine Loots, MD  Patient Profile    [name] THELL Sr. is a 62 y.o. 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 Jeraldine Loots.  Past Medical History  Subjective  Past Medical History:  Diagnosis Date   ESRD on hemodialysis (HCC)    a. HD MWF > 10 years.   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 01/2022 Echo: EF 45-50%, glob HK, mod LVH, mildly reduced RV fxn, mild MR, mod-sev MV Ca2+. Mild AS; b. 10/2023 Echo: EF 45-50%, glob HK, GrII DD, mildly reduced RV fxn, RVSP 52.78mmHg. Mild MR/MS, mod TR.   Hypertension    Nonischemic cardiomyopathy (HCC)    a. 2019 Cath [facility]): Nonobstructive disease; b. 12/2019 Echo: EF 50%; c. 12/2019 MV (Tx w/u [facility]): Ant infarct w/ peri-infarct ischemia. EF 35%; c. 05/2021 MV (Tx w/u [facility]): Cor Ca2+, EF 53%, motion artifact, equiv study, likely low risk.   Nonobstructive coronary artery disease    a.  2019 Cath ([place]): Left main normal, LAD 30 proximal, RCA 30 distal, EF 25%.   Obesity    Type 2 diabetes mellitus (HCC)    a. 10/2023 - admission w/ HHS.    Past Surgical History:  Procedure Laterality Date   A/V FISTULAGRAM N/A 08/15/2019   Procedure: A/V FISTULAGRAM;  Surgeon: Maeola Harman, MD;  Location: [facility] INVASIVE CV LAB;  Service: Cardiovascular;  Laterality: N/A;   AMPUTATION Right 02/02/2022   Procedure: AMPUTATION 5th RAY;  Surgeon: Louann Sjogren, MD;  Location: [facility] OR;  Service:  Podiatry;  Laterality: Right;   AMPUTATION Right 05/23/2022   Procedure: RIGHT BELOW KNEE AMPUTATION;  Surgeon: Nadara Mustard, MD;  Location: [facility] OR;  Service: Orthopedics;  Laterality: Right;   AMPUTATION Right 07/04/2022   Procedure: REVISION AMPUTATION BELOW KNEE;  Surgeon: Nadara Mustard, MD;  Location: [facility] OR;  Service: Orthopedics;  Laterality: Right;   APPLICATION OF WOUND VAC  05/23/2022   Procedure: APPLICATION OF WOUND VAC;  Surgeon: Nadara Mustard, MD;  Location: [facility] OR;  Service: Orthopedics;;   AV FISTULA PLACEMENT     CARDIAC CATHETERIZATION  02/17/2018   LHC 02/17/18 ([name]): 30% pLAD, 30% dRCA, LVEDP 25-30, LVEF 25%.     CATARACT EXTRACTION W/PHACO Right 03/29/2020   Procedure: CATARACT EXTRACTION PHACO AND INTRAOCULAR LENS PLACEMENT (IOC)  RIGHT DIABETIC;  Surgeon: Lockie Mola, MD;  Location: [facility] ORS;  Service: Ophthalmology;  Laterality: Right;  Lot # [id number] H [place]: 02:30.9 AP% 9.5% CDE: 14.38   EYE SURGERY Right    cataract removed   I & D EXTREMITY Right 02/02/2022   Procedure: IRRIGATION AND DEBRIDEMENT RIGHT FOOT;  Surgeon: Louann Sjogren, MD;  Location: [facility] OR;  Service: Podiatry;  Laterality: Right;   INCISION AND DRAINAGE OF WOUND Right 02/08/2022   Procedure: IRRIGATION AND DEBRIDEMENT WOUND;  Surgeon: Asencion Islam, DPM;  Location: [facility] OR;  Service: Podiatry;  Laterality: Right;  with removal of infected bone    INSERTION OF DIALYSIS CATHETER  08/13/2019   Procedure: Insertion Of Dialysis Catheter;  Surgeon: Larina Earthly, MD;  Location: [facility] OR;  Service: Vascular;;   INSERTION OF DIALYSIS CATHETER Left 07/19/2020   Procedure: INSERTION OF DIALYSIS CATHETER;  Surgeon: Maeola Harman, MD;  Location: [facility] OR;  Service: Vascular;  Laterality: Left;   IR US GUIDE BX ASP/DRAIN  11/07/2022   IRRIGATION AND DEBRIDEMENT ABSCESS     PERIPHERAL VASCULAR BALLOON ANGIOPLASTY  08/15/2019   Procedure: PERIPHERAL VASCULAR BALLOON ANGIOPLASTY;  Surgeon: Maeola Harman, MD;  Location: [facility] INVASIVE CV LAB;  Service: Cardiovascular;;   PERIPHERAL VASCULAR BALLOON ANGIOPLASTY Left 02/06/2022   Procedure: PERIPHERAL VASCULAR BALLOON ANGIOPLASTY;  Surgeon: Cephus Shelling, MD;  Location: [facility] INVASIVE CV LAB;  Service: Cardiovascular;  Laterality: Left;   REVISON OF ARTERIOVENOUS FISTULA Left 07/19/2020   Procedure: REVISON/PLICATION OF ARTERIOVENOUS FISTULA LEFT;  Surgeon: Maeola Harman, MD;  Location: [facility] OR;  Service: Vascular;  Laterality: Left;   THROMBECTOMY AND REVISION OF ARTERIOVENTOUS (AV) GORETEX  GRAFT Left 08/13/2019   Procedure: THROMBECTOMY OF LEFT ARM ARTERIOVENTOUS (AV) FISTULA;  Surgeon: Larina Earthly, MD;  Location: [facility] OR;  Service: Vascular;  Laterality: Left;   TOE AMPUTATION       Allergies  Allergies  Allergen Reactions   Sulfa Antibiotics Diarrhea and Nausea And Vomiting      History of Present Illness   62 year old male with the above complex past medical history including nonischemic cardiomyopathy, 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  In early 2019, he underwent evaluation in [place] due to LV dysfunction with an EF of 25%.  Diagnostic catheterization showed 30% stenoses in the proximal LAD and distal RCA.  He was medically managed.  He has been followed by [facility] nephrology in the past and previously underwent workups for transplant.  He subsequently underwent stress testing as part of transplant evaluation in February 2021, which showed anterior infarct with peri-infarct ischemia, though notes indicate that findings were suspicious for artifact and he did not undergo repeat cath at that time.  Repeat stress testing in July 2022, again in the setting of transplant evaluation at [facility] showed coronary calcium with an EF of 53% and  motion artifact, resulting in an equivocal study which was felt to likely be low risk.  Patient was last seen by [facility] cardiology in 2022.  Echocardiogram in March 2023 showed EF 45 to 50% with global hypokinesis.  Patient was recently admitted to [facility] in early December 2024 following mechanical fall and hyperglycemia (ran out of home insulin).  Presentation was consistent with HHS.  He was noted to have elevated troponin to 583.  Echo showed an EF of 45 to 50% with global hypokinesis, grade 2 diastolic dysfunction, RVSP of 16.1 mmHg, mild MR/MS, moderate TR, and mild to moderate aortic stenosis.  Notes indicate that cardiology was curb sided with recommendation for outpatient follow-up.  Currently, patient is relatively somnolent, occasionally opening his eyes and answering questions with one-word answers but is unable to participate in interview further.  ED notes indicate that he was at dialysis on December 22 and began complaining of chest pain with associated hypotension and near syncope.  He was transported to the emergency department.  On arrival, pt was hypotensive @ 75/59 and tachycardic @ 107.  Ecg subsequently showed RSR, 80, 1st deg avb, LAD, ant infarct, IVCD.  Na 130, Cl 91, BUN 29, Creat 7.5,  Gluc 290, HsTrop [id number]  7744.  CXR w/ cardiomegaly and mild central vasc congestion.  CTA chest neg for PE, Ao atherosclerosis present.  In the setting of apparent non-STEMI, he was placed on heparin.  Nursing notes indicate the patient has been somnolent throughout the night and this morning.  Currently he denies chest pain.  Outpatient Medications   No current facility-administered medications on file prior to encounter.   Current Outpatient Medications on File Prior to Encounter  Medication Sig Dispense Refill   acetaminophen (TYLENOL) 500 MG tablet Take 1,000 mg by mouth 2 (two) times daily as needed for moderate pain (pain score 4-6), headache or fever.     B Complex-C-Folic Acid (DIALYVITE  800) 0.8 MG TABS Take 1 tablet by mouth daily.     cinacalcet (SENSIPAR) 30 MG tablet Take 30 mg by mouth daily.     insulin isophane & regular human KwikPen (NOVOLIN 70/30 KWIKPEN) (70-30) 100 UNIT/ML KwikPen Inject 30U in the morning and 25U in the evening 15 mL 3   Insulin Pen Needle 32G X 4 MM MISC Use with insulin pen. 100 each 0   lidocaine-prilocaine (EMLA) cream Apply 1 Application topically Every Tuesday,Thursday,and Saturday with dialysis.     sevelamer carbonate (RENVELA) 800 MG tablet Take 2,400-4,000 mg by mouth See admin instructions. Take 5 tablets (4000mg ) three times daily with meals and 3 tablets (2400mg ) twice daily with snacks.        Family History    Family History  Problem Relation Age of Onset   Hypertension Mother    He indicated that his mother is alive. He indicated that his father is deceased.   Social History    Social History   Socioeconomic History   Marital status: Divorced    Spouse name: Not on file   Number of children: Not on file   Years of education: Not on file   Highest education level: Not on file  Occupational History   Not on file  Tobacco Use   Smoking status: Never   Smokeless tobacco: Never  Vaping Use   Vaping status: Never Used  Substance and Sexual Activity   Alcohol use: Not Currently   Drug use: Never   Sexual activity: Not on file  Other Topics Concern   Not on file  Social History Narrative   Lives locally.  Does not routinely exercise.   Social Drivers of Corporate investment banker Strain: Not on file  Food Insecurity: No Food Insecurity (10/24/2023)   Hunger Vital Sign    Worried About Running Out of Food in the Last Year: Never true    Ran Out of Food in the Last Year: Never true  Transportation Needs: No Transportation Needs (10/24/2023)   PRAPARE - Administrator, Civil Service (Medical): No    Lack of Transportation (Non-Medical): No  Physical Activity: Not on file  Stress: Not on file   Social Connections: Not on file  Intimate Partner Violence: Not At Risk (10/24/2023)   Humiliation, Afraid, Rape, and Kick questionnaire    Fear of Current or Ex-Partner: No    Emotionally Abused: No    Physically Abused: No    Sexually Abused: No     Review of Systems    General:  No chills, fever, night sweats or weight changes.  Cardiovascular:  +++ chest pain, +++ dyspnea on exertion, +++ pre syncope, no edema, orthopnea, palpitations, paroxysmal nocturnal dyspnea. Dermatological: No rash, lesions/masses Respiratory: No cough, +++  chronic dyspnea Urologic: No hematuria, dysuria Abdominal:   No nausea, vomiting, diarrhea, bright red blood per rectum, melena, or hematemesis Neurologic:  +++ Somnolence.   All other systems reviewed and are otherwise negative except as noted above.    Objective  Physical Exam    Blood pressure (!) 116/59, pulse 86, temperature 98 F (36.7 C), temperature source Oral, resp. rate 18, height 6\' 1"  (1.854 m), weight 113.4 kg, SpO2 95%.  General: Pleasant, NAD Psych: Relatively somnolent, flat affect. Neuro: Unable to determine orientation due to relative somnolence.  Opens eyes briefly with one-word answers and often ignores questions. HEENT: Normal  Neck: Supple without bruits or JVD. Lungs:  Resp regular and unlabored, CTA. Heart: RRR.  2 out of 6 systolic murmur heard throughout with to and fro component-likely secondary to radiated bruit from AV fistula in the left upper extremity. Abdomen: Soft, non-tender, non-distended, BS + x 4.  Extremities: Right BKA.  No clubbing, cyanosis or edema.  Left DP/PT1+.  Labs    Cardiac Enzymes Recent Labs  Lab 10/24/23 0608 10/24/23 1655 10/25/23 1500 11/08/23 1658 11/08/23 1830  TROPONINIHS 243* 558* 296* 7,689* 7,744*     BNP    Component Value Date/Time   BNP 1,670.9 (H) 07/01/2022 1344    Lab Results  Component Value Date   WBC 7.0 11/09/2023   HGB 14.0 11/09/2023   HCT 44.2 11/09/2023    MCV 92.1 11/09/2023   PLT 170 11/09/2023    Recent Labs  Lab 11/08/23 1658  NA 130*  K 4.5  CL 91*  CO2 26  BUN 28*  CREATININE 7.50*  CALCIUM 10.3  PROT 8.3*  BILITOT 0.6  ALKPHOS 127*  ALT 34  AST 44*  GLUCOSE 290*   Lab Results  Component Value Date   CHOL 119 02/05/2022   HDL 11 (L) 02/05/2022   LDLCALC 46 02/05/2022   TRIG 312 (H) 02/05/2022     Radiology Studies    DG Chest 2 View Result Date: 11/08/2023 CLINICAL DATA:  Shortness of breath. EXAM: CHEST - 2 VIEW COMPARISON:  CT scan, same date. FINDINGS: The heart is enlarged but appears stable. Mild central vascular congestion without overt pulmonary edema. No definite infiltrates or effusions. The bony thorax is intact. IMPRESSION: Cardiomegaly and mild central vascular congestion. Electronically Signed   By: Rudie Meyer M.D.   On: 11/08/2023 18:55   CT Angio Chest PE W and/or Wo Contrast Result Date: 11/08/2023 CLINICAL DATA:  Shortness of breath at dialysis.  Hypotension. EXAM: CT ANGIOGRAPHY CHEST WITH CONTRAST TECHNIQUE: Multidetector CT imaging of the chest was performed using the standard protocol during bolus administration of intravenous contrast. Multiplanar CT image reconstructions and MIPs were obtained to evaluate the vascular anatomy. RADIATION DOSE REDUCTION: This exam was performed according to the departmental dose-optimization program which includes automated exposure control, adjustment of the mA and/or kV according to patient size and/or use of iterative reconstruction technique. CONTRAST:  75mL OMNIPAQUE IOHEXOL 350 MG/ML SOLN COMPARISON:  CT scan 10/19/2022 FINDINGS: Cardiovascular: The heart is enlarged but appears stable. No pericardial effusion. Significant age advanced atherosclerotic calcification involving the aorta and coronary arteries. No aortic dissection or aneurysm. The pulmonary arterial tree is fairly well opacified. No filling defects to suggest pulmonary embolism. There are  extensive right-sided chest wall collaterals. 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 Mediastinum/Nodes: No mediastinal or hilar mass or lymphadenopathy. Scattered lymph nodes are stable. The esophagus is grossly normal. Lungs/Pleura: Mild dependent edema/atelectasis but no infiltrates  or effusions. No worrisome pulmonary lesions or pulmonary nodules. The central tracheobronchial tree is unremarkable. Upper Abdomen: Significant age advanced vascular disease. No aneurysm or dissection. No upper abdominal mass or adenopathy. Musculoskeletal: Remote ununited sternal fracture. No acute bony findings. Review of the MIP images confirms the above findings. IMPRESSION: 1. No CT findings for pulmonary embolism. 2. Significant age advanced vascular disease. 3. No acute pulmonary findings or worrisome pulmonary lesions. 4. 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 5. Remote ununited sternal fracture. 6. Aortic atherosclerosis. Aortic Atherosclerosis (ICD10-I70.0). Electronically Signed   By: Rudie Meyer M.D.   On: 11/08/2023 18:55   DG Knee Complete 4 Views Left Result Date: 10/24/2023 CLINICAL DATA:  Fall.  No left knee pain. EXAM: LEFT KNEE - COMPLETE 4+ VIEW COMPARISON:  None Available. FINDINGS: No evidence of fracture, dislocation, or joint effusion. No evidence of arthropathy or other focal bone abnormality. Soft tissues are unremarkable. IMPRESSION: Negative. Electronically Signed   By: Kennith Center M.D.   On: 10/24/2023 07:53   CT Renal Stone Study Result Date: 10/24/2023 CLINICAL DATA:  Abdominal pain EXAM: CT ABDOMEN AND PELVIS WITHOUT CONTRAST TECHNIQUE: Multidetector CT imaging of the abdomen and pelvis was performed following the standard protocol without IV contrast. RADIATION DOSE REDUCTION: This exam was performed according to the departmental dose-optimization program which includes automated exposure control, adjustment of the mA and/or kV  according to patient size and/or use of iterative reconstruction technique. COMPARISON:  MRI abdomen dated 10/30/2022 FINDINGS: Lower chest: Mild left basilar scarring/atelectasis. Hepatobiliary: Unenhanced liver is grossly unremarkable, noting a mildly nodular hepatic contour. Cholelithiasis, without associated inflammatory changes. No intrahepatic or extrahepatic duct dilatation. Pancreas: Within normal limits. Spleen: Within normal limits. Adrenals/Urinary Tract: Adrenal glands are within normal limits. Bilateral renal cortical scarring/atrophy. Bilateral renal cysts, measuring up to 18 mm in the posterior right lower kidney (series 3/image 37), benign (Bosniak I). No follow-up is recommended. Bilateral renal vascular calcifications. No renal calculi or hydronephrosis. Bladder is underdistended but unremarkable. Stomach/Bowel: Stomach is within normal limits. No evidence of bowel obstruction. Normal appendix (series 3/image 62). No colonic wall thickening or inflammatory changes. Vascular/Lymphatic: No evidence of abdominal aortic aneurysm. Extensive atherosclerotic calcifications of the abdominal aorta and branch vessels. No suspicious abdominopelvic lymphadenopathy. Reproductive: Prostate is unremarkable. Other: No abdominopelvic ascites. Musculoskeletal: Mild degenerative changes at L4-5. IMPRESSION: No CT findings to account for the patient's abdominal pain. Cholelithiasis, without associated inflammatory changes. Additional ancillary findings as above. Electronically Signed   By: Charline Bills M.D.   On: 10/24/2023 01:10   DG Pelvis 1-2 Views Result Date: 10/23/2023 CLINICAL DATA:  Fall with left hip pain EXAM: PELVIS - 1-2 VIEW COMPARISON:  CT 08/05/2019 FINDINGS: SI joints are non widened. Extensive vascular calcifications. No fracture or malalignment. Pubic symphysis and rami appear intact. Mild bilateral hip degenerative change IMPRESSION: No acute osseous abnormality. Electronically Signed   By:  Jasmine Pang M.D.   On: 10/23/2023 19:17   DG Femur Min 2 Views Left Result Date: 10/23/2023 CLINICAL DATA:  Fall with left hip pain EXAM: LEFT FEMUR 2 VIEWS COMPARISON:  None Available. FINDINGS: There is no evidence of fracture or other focal bone lesions. Soft tissues are unremarkable. Extensive vascular calcifications. IMPRESSION: Negative. Electronically Signed   By: Jasmine Pang M.D.   On: 10/23/2023 19:16      ECG & Cardiac Imaging    Regular sinus rhythm, 80, first-degree AV block, left axis deviation, anterior infarct, IVCD- personally  reviewed.  Assessment & Plan    1.  Non-STEMI/CAD: Patient with nonobstructive CAD on diagnostic catheterization 2019 in [place].  Equivocal stress test in 2022 at [facility], though presumed to be low risk.  He is not routinely followed by cardiology.  Patient reportedly presented from hemodialysis on December 22 with hypotension, presyncope, chest pain, and dyspnea.  Troponins elevated at 7744 on last evaluation yesterday evening.  Patient is relatively somnolent and unable to participate in current interview, though he awakens long enough to say he is not having chest pain.  Cycle troponins to peak.  Continue heparin.  Repeat limited echo to reevaluate for new LV dysfunction.  Recommend transfer to [facility] on medicine service for further management and evaluation by our team down there as pending clearance of mental status, patient will require diagnostic catheterization.  Intermittent hypotension in the emergency department limits ability to use beta-blocker therapy.  Will add aspirin and statin.  2.  Altered mental status: Nursing notes indicate somnolence overnight and he remains relatively somnolent this morning.  Consider head CT.  3.  Chronic heart failure with midrange ejection fraction/nonischemic cardiomyopathy: Most recent EF 45 to 50% by echocardiogram earlier this month.  Appears euvolemic on examination.  Volume management per  nephrology/hemodialysis.  Heart rate and blood pressure currently stable.  Does not appear to have been on GDMT at home.  Intermittent hypotension we will continue to limit initiation and titration of GDMT at this time.  4.  End-stage renal disease: On hemodialysis for greater than 10 years.  Will need nephrology consultation with initiation of hemodialysis during inpatient stay.  5.  Hypotension with history of hypertension: Does not appear to be on any antihypertensives at home.  Hypotensive on arrival here in the setting of hemodialysis prior to arrival.  Avoid antihypertensives.  Follow.  6.  Type 2 diabetes mellitus: Recent admission for HHS.  Insulin management per medicine team.  Risk Assessment/Risk Scores:     TIMI Risk Score for Unstable Angina or Non-ST Elevation MI:   The patient's TIMI risk score is 3, which indicates a 13% risk of all cause mortality, new or recurrent myocardial infarction or need for urgent revascularization in the next 14 days.    Signed, Nicolasa Ducking, NP 11/09/2023, 10:54 AM  For questions or updates, please contact   Please consult [email or web address] for contact info under Cardiology/STEMI.   Attending note:  Patient seen and examined in the [facility] ER.  I reviewed available records and discussed the case with Mr. Brion Aliment NP and also Dr. Jeraldine Loots.  On my interview patient currently not providing any detailed medical history or discussion of symptoms, somnolent overall and answering questions to very limited degree.  Per chart review he was sent to the ER from hemodialysis due to hypotension and apparent chest discomfort.  Systolic 75 on arrival.  He was given IV fluids.  Chest x-ray reporting cardiomegaly with mild central vascular congestion.  Chest CTA negative for pulmonary embolus but does show fairly diffuse vascular disease.  High-sensitivity troponin I levels increased to 7744.  ECG shows sinus rhythm with lead motion artifact, IVCD, and  anterolateral ST segment depression that is old in comparison to tracing from earlier in the month.  Afebrile, recent systolic 110-120, heart rate in the 70s in sinus rhythm by telemetry.  Lungs exhibit decreased breath sounds with scattered rhonchi, cardiac exam with RRR and 2-3/6 systolic murmur.  AV fistula left upper extremity - likely also component of murmur.  Pertinent lab  work includes a sodium 130, BUN 28, creatinine 7.5, potassium 4.5, high-sensitivity troponin I 7744 up from 7689, WBC 7.0, hemoglobin 14.0, platelets 170, glucose 290.  NSTEMI based on cardiac enzymes, ECG with stable abnormalities compared to tracing from earlier in the month.  Symptom course unclear as patient unable to provide much history.  Presented hypotensive from hemodialysis and possibly with chest discomfort, shakes his head "no" when asked about chest pain on my interview.  He has been accepted to [facility] for transfer, bed pending.  I have asked the hospitalist team to see him and accept his care with cardiology following in consultation.  Consider head CT and further investigating altered mental status prior to pursuing a cardiac catheterization.  He has been treated with aspirin and is on IV heparin per ER team.  Would obtain an echocardiogram.  Consult nephrology.  Jonelle Sidle, M.D., F.A.C.C.

## 2023-11-09 NOTE — Progress Notes (Signed)
PHARMACY - ANTICOAGULATION CONSULT NOTE  Pharmacy Consult for heparin Indication: chest pain/ACS  Labs: Recent Labs    11/08/23 1658 11/08/23 1830 11/09/23 0328  HGB 14.4  --  14.0  HCT 44.6  --  44.2  PLT 176  --  170  APTT  --  28  --   LABPROT  --  15.2  --   INR  --  1.2  --   HEPARINUNFRC  --   --  0.10*  CREATININE 7.50*  --   --   TROPONINIHS 6,962* 9,528*  --    Assessment: 62yo male subtherapeutic on heparin with initial dosing for ACS; no infusion issues or signs of bleeding per RN.  Goal of Therapy:  Heparin level 0.3-0.7 units/ml   Plan:  2000 units heparin bolus. Increase heparin infusion by 3 units/kgABW/hr to 1600 units/hr. Check level in 8 hours.   Vernard Gambles, PharmD, BCPS 11/09/2023 3:59 AM

## 2023-11-09 NOTE — Consult Note (Addendum)
[facility] Renal Consultation Note    Indication for Consultation:  Management of ESRD/hemodialysis; anemia, hypertension/volume and secondary hyperparathyroidism PCP: No PCP  HPI: [name] ALPERN Sr. is a 62 y.o. male.with ESRD on hemodialysis MWF at [facility]. PMH: HTN, DMT2, R BKA, nonobstructive CAD,HFrEF, NICM, obesity, anemia, SHPT.   Patient presented to HD unit for routine HD 11/08/2023. He arrived to HD unit 11 kg over OP EDW. He ran 2:42 hours of HD, left 10 kgs above OP EDW. History of high IDWG. Unfortunately he is not allowed to have more than 6 liters removed. RN tells me that mid treatment, he requested O2, complained of chest pain and asked to go to hospital. He was transported via EMS to [facility] ED for evaluation of chest pain/near syncope. Troponin [id number] on arrival to ED. He was seen by cardiology this AM, diagnosed with NSTEMI. He has been transferred to [facility] for further work up.   Patient seen in room. RN trying to wake up patient.Eyes closed but he will wake up briefly, answering questions in monosyllables. He denies CP/SOB at present. SR on monitor. No LE edema but has decreased breath sounds, bibasilar crackles. O2 sats 100% on 2L/M [place]. Will have HD in AM.   Past Medical History:  Diagnosis Date   ESRD on hemodialysis (HCC)    a. HD MWF > 10 years.   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 01/2022 Echo: EF 45-50%, glob HK, mod LVH, mildly reduced RV fxn, mild MR, mod-sev MV Ca2+. Mild AS; b. 10/2023 Echo: EF 45-50%, glob HK, GrII DD, mildly reduced RV fxn, RVSP 52.47mmHg. Mild MR/MS, mod TR.   Hypertension    Nonischemic cardiomyopathy (HCC)    a. 2019 Cath [facility]): Nonobstructive disease; b. 12/2019 Echo: EF 50%; c. 12/2019 MV (Tx w/u [facility]): Ant infarct w/ peri-infarct ischemia. EF 35%; c. 05/2021 MV (Tx w/u [facility]): Cor Ca2+, EF 53%, motion artifact, equiv study, likely low risk.   Nonobstructive coronary artery disease     a.  2019 Cath ([place]): Left main normal, LAD 30 proximal, RCA 30 distal, EF 25%.   Obesity    Type 2 diabetes mellitus (HCC)    a. 10/2023 - admission w/ HHS.   Past Surgical History:  Procedure Laterality Date   A/V FISTULAGRAM N/A 08/15/2019   Procedure: A/V FISTULAGRAM;  Surgeon: Maeola Harman, MD;  Location: [facility] INVASIVE CV LAB;  Service: Cardiovascular;  Laterality: N/A;   AMPUTATION Right 02/02/2022   Procedure: AMPUTATION 5th RAY;  Surgeon: Louann Sjogren, MD;  Location: [facility] OR;  Service: Podiatry;  Laterality: Right;   AMPUTATION Right 05/23/2022   Procedure: RIGHT BELOW KNEE AMPUTATION;  Surgeon: Nadara Mustard, MD;  Location: [facility] OR;  Service: Orthopedics;  Laterality: Right;   AMPUTATION Right 07/04/2022   Procedure: REVISION AMPUTATION BELOW KNEE;  Surgeon: Nadara Mustard, MD;  Location: [facility] OR;  Service: Orthopedics;  Laterality: Right;   APPLICATION OF WOUND VAC  05/23/2022   Procedure: APPLICATION OF WOUND VAC;  Surgeon: Nadara Mustard, MD;  Location: [facility] OR;  Service: Orthopedics;;   AV FISTULA PLACEMENT     CARDIAC CATHETERIZATION  02/17/2018   LHC 02/17/18 ([name]): 30% pLAD, 30% dRCA, LVEDP 25-30, LVEF 25%.     CATARACT EXTRACTION W/PHACO Right 03/29/2020   Procedure: CATARACT EXTRACTION PHACO AND INTRAOCULAR LENS PLACEMENT (IOC)  RIGHT DIABETIC;  Surgeon: Lockie Mola, MD;  Location: [facility] ORS;  Service: Ophthalmology;  Laterality: Right;  Lot # [id number] H [place]: 02:30.9 AP% 9.5% CDE: 14.38   EYE SURGERY Right    cataract removed   I & D EXTREMITY Right 02/02/2022   Procedure: IRRIGATION AND DEBRIDEMENT RIGHT FOOT;  Surgeon: Louann Sjogren, MD;  Location: [facility] OR;  Service: Podiatry;  Laterality: Right;   INCISION AND DRAINAGE OF WOUND Right 02/08/2022   Procedure: IRRIGATION AND DEBRIDEMENT WOUND;  Surgeon: Asencion Islam, DPM;  Location: [facility] OR;  Service: Podiatry;  Laterality: Right;  with removal of infected bone    INSERTION OF  DIALYSIS CATHETER  08/13/2019   Procedure: Insertion Of Dialysis Catheter;  Surgeon: Larina Earthly, MD;  Location: [facility] OR;  Service: Vascular;;   INSERTION OF DIALYSIS CATHETER Left 07/19/2020   Procedure: INSERTION OF DIALYSIS CATHETER;  Surgeon: Maeola Harman, MD;  Location: [facility] OR;  Service: Vascular;  Laterality: Left;   IR US GUIDE BX ASP/DRAIN  11/07/2022   IRRIGATION AND DEBRIDEMENT ABSCESS     PERIPHERAL VASCULAR BALLOON ANGIOPLASTY  08/15/2019   Procedure: PERIPHERAL VASCULAR BALLOON ANGIOPLASTY;  Surgeon: Maeola Harman, MD;  Location: [facility] INVASIVE CV LAB;  Service: Cardiovascular;;   PERIPHERAL VASCULAR BALLOON ANGIOPLASTY Left 02/06/2022   Procedure: PERIPHERAL VASCULAR BALLOON ANGIOPLASTY;  Surgeon: Cephus Shelling, MD;  Location: [facility] INVASIVE CV LAB;  Service: Cardiovascular;  Laterality: Left;   REVISON OF ARTERIOVENOUS FISTULA Left 07/19/2020   Procedure: REVISON/PLICATION OF ARTERIOVENOUS FISTULA LEFT;  Surgeon: Maeola Harman, MD;  Location: [facility] OR;  Service: Vascular;  Laterality: Left;   THROMBECTOMY AND REVISION OF ARTERIOVENTOUS (AV) GORETEX  GRAFT Left 08/13/2019   Procedure: THROMBECTOMY OF LEFT ARM ARTERIOVENTOUS (AV) FISTULA;  Surgeon: Larina Earthly, MD;  Location: [facility] OR;  Service: Vascular;  Laterality: Left;   TOE AMPUTATION     Family History  Problem Relation Age of Onset   Hypertension Mother    Social History:  reports that he has never smoked. He has never used smokeless tobacco. He reports that he does not currently use alcohol. He reports that he does not use drugs. Allergies  Allergen Reactions   Sulfa Antibiotics Diarrhea and Nausea And Vomiting   Prior to Admission medications   Medication Sig Start Date End Date Taking? Authorizing Provider  acetaminophen (TYLENOL) 500 MG tablet Take 1,000 mg by mouth 2 (two) times daily as needed for moderate pain (pain score 4-6), headache or fever.    [provider]  B  Complex-C-Folic Acid (DIALYVITE 800) 0.8 MG TABS Take 1 tablet by mouth daily.    [provider]  cinacalcet (SENSIPAR) 30 MG tablet Take 30 mg by mouth daily.    [provider]  insulin isophane & regular human KwikPen (NOVOLIN 70/30 KWIKPEN) (70-30) 100 UNIT/ML KwikPen Inject 30U in the morning and 25U in the evening 10/27/23   Briant Cedar, MD  Insulin Pen Needle 32G X 4 MM MISC Use with insulin pen. 10/28/23   Briant Cedar, MD  lidocaine-prilocaine (EMLA) cream Apply 1 Application topically Every Tuesday,Thursday,and Saturday with dialysis.    [provider]  sevelamer carbonate (RENVELA) 800 MG tablet Take 2,400-4,000 mg by mouth See admin instructions. Take 5 tablets (4000mg ) three times daily with meals and 3 tablets (2400mg ) twice daily with snacks.    [provider]   Current Facility-Administered Medications  Medication Dose Route Frequency Provider Last Rate Last Admin   aspirin chewable tablet 81 mg  81 mg Oral Daily Creig Hines, NP  81 mg at 11/09/23 1136   atorvastatin (LIPITOR) tablet 40 mg  40 mg Oral Daily Creig Hines, NP   40 mg at 11/09/23 1136   heparin ADULT infusion 100 units/mL (25000 units/215mL)  1,600 Units/hr Intravenous Continuous Juliette Mangle, RPH 16 mL/hr at 11/09/23 0436 1,600 Units/hr at 11/09/23 0436   Labs: Basic Metabolic Panel: Recent Labs  Lab 11/08/23 1658  NA 130*  K 4.5  CL 91*  CO2 26  GLUCOSE 290*  BUN 28*  CREATININE 7.50*  CALCIUM 10.3   Liver Function Tests: Recent Labs  Lab 11/08/23 1658  AST 44*  ALT 34  ALKPHOS 127*  BILITOT 0.6  PROT 8.3*  ALBUMIN 3.3*   No results for input(s): "LIPASE", "AMYLASE" in the last 168 hours. No results for input(s): "AMMONIA" in the last 168 hours. CBC: Recent Labs  Lab 11/08/23 1658 11/09/23 0328  WBC 6.3 7.0  NEUTROABS 3.7  --   HGB 14.4 14.0  HCT 44.6 44.2  MCV 91.6 92.1  PLT 176 170   Cardiac  Enzymes: No results for input(s): "CKTOTAL", "CKMB", "CKMBINDEX", "TROPONINI" in the last 168 hours. CBG: No results for input(s): "GLUCAP" in the last 168 hours. Iron Studies: No results for input(s): "IRON", "TIBC", "TRANSFERRIN", "FERRITIN" in the last 72 hours. Studies/Results: ECHOCARDIOGRAM COMPLETE Result Date: 11/09/2023    ECHOCARDIOGRAM REPORT   Patient Name:   [name] GULAN Sr. Date of Exam: 11/09/2023 Medical Rec #:  [id number]         Height:       73.0 in Accession #:    [id number]        Weight:       250.0 lb Date of Birth:  May 01, 1961        BSA:          2.366 m Patient Age:    62 years          BP:           126/56 mmHg Patient Gender: M                 HR:           89 bpm. [facility] Procedure: 2D Echo, Cardiac Doppler and Color Doppler Indications:    Chest Pain R07.9  History:        Patient has prior history of Echocardiogram examinations, most                 recent 10/25/2023. Cardiomyopathy and CHF, Previous Myocardial                 Infarction and CAD; Risk Factors:Hypertension and Diabetes.  Sonographer:    Celesta Gentile RCS Referring Phys: [phone] CHRISTOPHER RONALD BERGE IMPRESSIONS  1. Left ventricular ejection fraction, by estimation, is 30 to 35%. The left ventricle has moderately decreased function. The left ventricle demonstrates regional wall motion abnormalities (see scoring diagram/findings for description). There is moderate concentric left ventricular hypertrophy. Left ventricular diastolic parameters are consistent with Grade III diastolic dysfunction (restrictive).  2. Right ventricular systolic function is mildly reduced. The right ventricular size is normal. There is mildly elevated pulmonary artery systolic pressure. The estimated right ventricular systolic pressure is 42.2 mmHg.  3. Left atrial size was moderately dilated.  4. The mitral valve is degenerative. Trivial mitral valve regurgitation. Moderate mitral annular calcification.  5. The aortic  valve is tricuspid. There is moderate calcification of the aortic valve. Aortic valve regurgitation  is not visualized. Moderate to severe aortic valve stenosis - suspect upper end of scale, low flow/low gradient. Aortic valve area, by VTI measures 0.98 cm. Aortic valve mean gradient measures 22.0 mmHg. Dimentionless index 0.19.  6. Aortic dilatation noted. There is borderline dilatation of the aortic root, measuring 40 mm.  7. The inferior vena cava is dilated in size with <50% respiratory variability, suggesting right atrial pressure of 15 mmHg. Comparison(s): Prior images reviewed side by side. LVEF has decreased, 30-35% range with basal inferoposterior akinesis and restrictive diastolic filling. Aortic stenosis possibly severe (low flow/low gradient). Mildly elevated RVSP. FINDINGS  Left Ventricle: Left ventricular ejection fraction, by estimation, is 30 to 35%. The left ventricle has moderately decreased function. The left ventricle demonstrates regional wall motion abnormalities. The left ventricular internal cavity size was normal in size. There is moderate concentric left ventricular hypertrophy. Left ventricular diastolic parameters are consistent with Grade III diastolic dysfunction (restrictive).  LV Wall Scoring: The inferior wall, posterior wall, and basal anterolateral segment are akinetic. The entire anterior wall, entire septum, entire apex, and mid anterolateral segment are hypokinetic. Right Ventricle: The right ventricular size is normal. No increase in right ventricular wall thickness. Right ventricular systolic function is mildly reduced. There is mildly elevated pulmonary artery systolic pressure. The tricuspid regurgitant velocity  is 2.61 m/s, and with an assumed right atrial pressure of 15 mmHg, the estimated right ventricular systolic pressure is 42.2 mmHg. Left Atrium: Left atrial size was moderately dilated. Right Atrium: Right atrial size was normal in size. Pericardium: There is no  evidence of pericardial effusion. Mitral Valve: The mitral valve is degenerative in appearance. There is mild thickening of the mitral valve leaflet(s). There is mild calcification of the mitral valve leaflet(s). Moderate mitral annular calcification. Trivial mitral valve regurgitation. Tricuspid Valve: The tricuspid valve is grossly normal. Tricuspid valve regurgitation is mild. Aortic Valve: The aortic valve is tricuspid. There is moderate calcification of the aortic valve. There is moderate aortic valve annular calcification. Aortic valve regurgitation is not visualized. Moderate to severe aortic stenosis is present. Aortic valve mean gradient measures 22.0 mmHg. Aortic valve peak gradient measures 32.0 mmHg. Aortic valve area, by VTI measures 0.98 cm. Pulmonic Valve: The pulmonic valve was grossly normal. Pulmonic valve regurgitation is trivial. Aorta: Aortic dilatation noted. There is borderline dilatation of the aortic root, measuring 40 mm. Venous: The inferior vena cava is dilated in size with less than 50% respiratory variability, suggesting right atrial pressure of 15 mmHg. IAS/Shunts: No atrial level shunt detected by color flow Doppler.  LEFT VENTRICLE PLAX 2D LVIDd:         5.80 cm      Diastology LVIDs:         4.80 cm      LV e' medial:    4.68 cm/s LV PW:         1.40 cm      LV E/e' medial:  28.4 LV IVS:        1.40 cm      LV e' lateral:   5.33 cm/s LVOT diam:     2.60 cm      LV E/e' lateral: 25.0 LV SV:         57 LV SV Index:   24 LVOT Area:     5.31 cm  LV Volumes (MOD) LV vol d, MOD A2C: 169.0 ml LV vol d, MOD A4C: 184.0 ml LV vol s, MOD A2C: 97.6 ml LV vol s, MOD  A4C: 111.0 ml LV SV MOD A2C:     71.4 ml LV SV MOD A4C:     184.0 ml LV SV MOD BP:      72.1 ml RIGHT VENTRICLE RV S prime:     6.31 cm/s TAPSE (M-mode): 1.4 cm LEFT ATRIUM              Index        RIGHT ATRIUM           Index LA diam:        5.10 cm  2.16 cm/m   RA Area:     20.40 cm LA Vol (A2C):   105.0 ml 44.38 ml/m  RA  Volume:   61.40 ml  25.95 ml/m LA Vol (A4C):   101.0 ml 42.69 ml/m LA Biplane Vol: 102.0 ml 43.12 ml/m  AORTIC VALVE AV Area (Vmax):    1.04 cm AV Area (Vmean):   0.96 cm AV Area (VTI):     0.98 cm AV Vmax:           283.00 cm/s AV Vmean:          219.000 cm/s AV VTI:            0.583 m AV Peak Grad:      32.0 mmHg AV Mean Grad:      22.0 mmHg LVOT Vmax:         55.40 cm/s LVOT Vmean:        39.400 cm/s LVOT VTI:          0.108 m LVOT/AV VTI ratio: 0.19  AORTA Ao Root diam: 4.00 cm MITRAL VALVE                TRICUSPID VALVE MV Area (PHT): 6.17 cm     TR Peak grad:   27.2 mmHg MV Decel Time: 123 msec     TR Vmax:        261.00 cm/s MR Peak grad: 92.5 mmHg MR Mean grad: 54.0 mmHg     SHUNTS MR Vmax:      481.00 cm/s   Systemic VTI:  0.11 m MR Vmean:     334.0 cm/s    Systemic Diam: 2.60 cm MV E velocity: 133.00 cm/s MV A velocity: 66.90 cm/s MV E/A ratio:  1.99 Nona Dell MD Electronically signed by Nona Dell MD Signature Date/Time: 11/09/2023/1:41:56 PM    Final    CT Head Wo Contrast Result Date: 11/09/2023 CLINICAL DATA:  Provided history: Mental status change, unknown cause. EXAM: CT HEAD WITHOUT CONTRAST TECHNIQUE: Contiguous axial images were obtained from the base of the skull through the vertex without intravenous contrast. RADIATION DOSE REDUCTION: This exam was performed according to the departmental dose-optimization program which includes automated exposure control, adjustment of the mA and/or kV according to patient size and/or use of iterative reconstruction technique. COMPARISON:  Head CT 10/30/2022.  Brain MRI 02/04/2022. FINDINGS: Residual circulating contrast within the dural venous sinuses. 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 Mild patchy and ill-defined hypoattenuation within the cerebral white matter, nonspecific but compatible with chronic small vessel  ischemic disease. There is no acute intracranial hemorrhage. No demarcated cortical infarct. No extra-axial fluid collection. No evidence of an intracranial mass. No midline shift. Vascular: No hyperdense arterial vessel. Atherosclerotic calcifications. Skull: No calvarial fracture or aggressive osseous lesion. Sinuses/Orbits: No mass or acute finding within the imaged  orbits. Partially imaged mucous retention cyst within the left maxillary sinus measuring at least 1.8 cm. IMPRESSION: 1.  No evidence of an acute intracranial abnormality. 2. 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 3. Mild cerebral white matter chronic small vessel ischemic disease. 4. Partially imaged left maxillary sinus mucous retention cyst (measuring at least 1.8 cm). Electronically Signed   By: Jackey Loge D.O.   On: 11/09/2023 13:05   DG Chest 2 View Result Date: 11/08/2023 CLINICAL DATA:  Shortness of breath. EXAM: CHEST - 2 VIEW COMPARISON:  CT scan, same date. FINDINGS: The heart is enlarged but appears stable. Mild central vascular congestion without overt pulmonary edema. No definite infiltrates or effusions. The bony thorax is intact. IMPRESSION: Cardiomegaly and mild central vascular congestion. Electronically Signed   By: Rudie Meyer M.D.   On: 11/08/2023 18:55   CT Angio Chest PE W and/or Wo Contrast Result Date: 11/08/2023 CLINICAL DATA:  Shortness of breath at dialysis.  Hypotension. EXAM: CT ANGIOGRAPHY CHEST WITH CONTRAST TECHNIQUE: Multidetector CT imaging of the chest was performed using the standard protocol during bolus administration of intravenous contrast. Multiplanar CT image reconstructions and MIPs were obtained to evaluate the vascular anatomy. RADIATION DOSE REDUCTION: This exam was performed according to the departmental dose-optimization program which includes automated exposure control, adjustment of the mA and/or kV according to patient size and/or use of iterative reconstruction  technique. CONTRAST:  75mL OMNIPAQUE IOHEXOL 350 MG/ML SOLN COMPARISON:  CT scan 10/19/2022 FINDINGS: Cardiovascular: The heart is enlarged but appears stable. No pericardial effusion. Significant age advanced atherosclerotic calcification involving the aorta and coronary arteries. No aortic dissection or aneurysm. The pulmonary arterial tree is fairly well opacified. No filling defects to suggest pulmonary embolism. There are extensive right-sided chest wall collaterals. 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 Mediastinum/Nodes: No mediastinal or hilar mass or lymphadenopathy. Scattered lymph nodes are stable. The esophagus is grossly normal. Lungs/Pleura: Mild dependent edema/atelectasis but no infiltrates or effusions. No worrisome pulmonary lesions or pulmonary nodules. The central tracheobronchial tree is unremarkable. Upper Abdomen: Significant age advanced vascular disease. No aneurysm or dissection. No upper abdominal mass or adenopathy. Musculoskeletal: Remote ununited sternal fracture. No acute bony findings. Review of the MIP images confirms the above findings. IMPRESSION: 1. No CT findings for pulmonary embolism. 2. Significant age advanced vascular disease. 3. No acute pulmonary findings or worrisome pulmonary lesions. 4. 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 5. Remote ununited sternal fracture. 6. Aortic atherosclerosis. Aortic Atherosclerosis (ICD10-I70.0). Electronically Signed   By: Rudie Meyer M.D.   On: 11/08/2023 18:55    ROS: As per HPI otherwise negative.   Physical Exam: Vitals:   11/09/23 1140 11/09/23 1315 11/09/23 1330 11/09/23 1415  BP: (!) 126/56 (!) 95/33 (!) 100/27 (!) 102/43  Pulse: 89 85 89 88  Resp:  (!) 29 (!) 25 (!) 22  Temp:      TempSrc:      SpO2: 93% 92% 92% (!) 83%  Weight:      Height:         General: Chronically ill appearing male in no acute distress. Head: Normocephalic, atraumatic,  sclera non-icteric, mucus membranes are moist Neck: Supple. JVD 1/4 to mandible Lungs: Bilateral breath sounds decreased in bases R>L with few bibasilar crackles. Heart: SR with S1 S2 3/6 systolic M 5th ICS MCL. No R/G.  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 no edema LLE Neuro: Alert and  oriented X 1 Psych:  Lethargic, slow to respond to verbal stimuli  Dialysis Access: L AVF + T/B  Dialysis Orders: Center: [facility] 4 hr 15 min 180NRe 450/700 105 kg 2.0 K/ 2.0 Ca UFP 4 AVF - Heparin 7000 units IV Initial bolus Heparin 2000  units IV mid run - Hectorol 7 mcg IV three times per week   Assessment/Plan:  NSTEMI-work up per cardiology/primary.   AMS- does not appear to be uremic. SCR 7.5 BUN 28 but labs drawn immediately following HD. Reviewed med list, do not see offending agents.   HFrEF-attempt to optimize volume with dialysis. ECHO today with EF 30 to 35%. Cards following. Fluid restrictions and daily weights.   ESRD -  HD 11/10/2023 on Holiday Schedule.   Hyportension/volume  - Hypotensive, not on antihypertensive meds. Will order midodrine with HD tomorrow. Use albumin to support BP tomorrow if needed.   Anemia  - HGB 14.0. Follow HGB  Metabolic bone disease -  Last OP labs close to goal. Decrease VDRA D/T Slightly elevated calcium. On high dose Renvela binders.  Nutrition - Renal diet when eating.   DMT2-per primary  Dene Gentry. Manson Passey, NP-C 11/09/2023, 3:23 PM  [employer] [phone]

## 2023-11-09 NOTE — ED Notes (Signed)
Pt has been very groggy most of the night. Nurse asked pt to help reposition himself in his bed and pt stated he couldn't help. Nurse is aware that pt can do for himself at home. Nurse offered pt a more comfortable bed but pt refused at first. Nurse explained to pt that he may be here for a while and it would be wise to accept the bed. Pt moved to hospital bed with assistance x4 due to pt's grogginess. Pt disoriented at first wake, but did become oriented as we asked more questions. Pt is now more comfortable, BP is above 100 systolic and O2 is 98. Will cont to monitor

## 2023-11-09 NOTE — Assessment & Plan Note (Signed)
Patient with no signs of acute decompensation Plan to continue blood pressure monitoring Follow up on new echocardiogram.  Continue telemetry monitoring.

## 2023-11-09 NOTE — ED Notes (Signed)
Assisted pt up in bed

## 2023-11-09 NOTE — ED Notes (Signed)
Pt refused breakfast 

## 2023-11-09 NOTE — Progress Notes (Signed)
Pt transferred to [facility] from [facility].  Upon arrival, pt lethargic, opening eyes but falling right back to sleep.  Pt is following simple commands, and some questions with 1 word answers.  Attempted to stand pt for a weight with 3 staff numbers.  Pt stood, but unsteady, hunched over.  Pt returned back to bed for safety.  NP at the bedside, states that this is not his baseline.  This RN unable to complete admission assessment/questions or obtain consent for HD for 11/10/23.  Pt too lethargic for PO meds, unsafe to attempt to administer.  Bed alarm on, call bell within reach.

## 2023-11-09 NOTE — Assessment & Plan Note (Signed)
Multifactorial encephalopathy.  Continue supportive medical care. Head CT with no acute bleed or new CVA  Continue neuro checks q 4 hrs  If worsening or not improving may need further work up with brain MRI. Patient had severe hypotension on admission, that can be culprit of new CVA.

## 2023-11-09 NOTE — Assessment & Plan Note (Signed)
Continue blood pressure monitoring and neuro checks.  Avoid hypotension.  Continue asa and statin

## 2023-11-09 NOTE — ED Notes (Signed)
Attempted to help pt slide up in bed and pt refused.

## 2023-11-09 NOTE — Progress Notes (Signed)
*  PRELIMINARY RESULTS* Echocardiogram 2D Echocardiogram has been performed.  Stacey Drain 11/09/2023, 1:12 PM

## 2023-11-09 NOTE — H&P (Signed)
History and Physical    Patient: [name] [name]:[id number] DOB: 02/09/1961 DOA: 11/08/2023 DOS: the patient was seen and examined on 11/09/2023 PCP: Pcp, No  Patient coming from: Home  Chief Complaint:  Chief Complaint  Patient presents with   Hypotension   Cough   Shortness of Breath   HPI: [name] QUINTOS Sr. is a 62 y.o. male with medical history significant of ESRD on HD, (MWF) heart failure, T2DM, sp right BKA and obesity who presented after experiencing a near syncope during hemodialysis.  Recent hospitalization 12/06 to 12/10 for uncontrolled hyperglycemia.   Most information obtained from medical record, since patient not able to give detailed history due to acute cognitive impairment.   Patient was having HD on 12/22 when he was noted to have chest pain and hypotension, along with a near syncope episode. Apparently treatment was stopped and EMS was called. He wss found hypotensive and was transported to the ED.  His blood pressure was initially low in the ED 75/59, but improved to 106/72. He was found to have very elevated high sensitive troponin and cardiology was consulted for evaluation.  He was diagnosed with NSTEMI and plan to admit patient to [facility] under cardiology.   Today patient with somnolence, and decreased responsiveness. Cardiology evaluation at [facility], recommended further evaluation by medical team, [facility]), and continue medical management for acute coronary syndrome with follow up with inpatient Cardiology as consultation.   At the time of my examination patient is somnolent but easy to arouse, he follows commands and responds to simple questions.  He tells me he has been experiencing dyspnea over the last several days, moderate to severe in intensity with no chest pain. He mentions not being compliant with outpatient hemodialysis.  Not able to ger further history.     Review of Systems: unable to review all systems due to the inability of the patient to answer  questions. Past Medical History:  Diagnosis Date   ESRD on hemodialysis (HCC)    a. HD MWF > 10 years.   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 01/2022 Echo: EF 45-50%, glob HK, mod LVH, mildly reduced RV fxn, mild MR, mod-sev MV Ca2+. Mild AS; b. 10/2023 Echo: EF 45-50%, glob HK, GrII DD, mildly reduced RV fxn, RVSP 52.42mmHg. Mild MR/MS, mod TR.   Hypertension    Nonischemic cardiomyopathy (HCC)    a. 2019 Cath [facility]): Nonobstructive disease; b. 12/2019 Echo: EF 50%; c. 12/2019 MV (Tx w/u [facility]): Ant infarct w/ peri-infarct ischemia. EF 35%; c. 05/2021 MV (Tx w/u [facility]): Cor Ca2+, EF 53%, motion artifact, equiv study, likely low risk.   Nonobstructive coronary artery disease    a.  2019 Cath ([place]): Left main normal, LAD 30 proximal, RCA 30 distal, EF 25%.   Obesity    Type 2 diabetes mellitus (HCC)    a. 10/2023 - admission w/ HHS.   Past Surgical History:  Procedure Laterality Date   A/V FISTULAGRAM N/A 08/15/2019   Procedure: A/V FISTULAGRAM;  Surgeon: Maeola Harman, MD;  Location: [facility] INVASIVE CV LAB;  Service: Cardiovascular;  Laterality: N/A;   AMPUTATION Right 02/02/2022   Procedure: AMPUTATION 5th RAY;  Surgeon: Louann Sjogren, MD;  Location: [facility] OR;  Service: Podiatry;  Laterality: Right;   AMPUTATION Right 05/23/2022   Procedure: RIGHT BELOW KNEE AMPUTATION;  Surgeon: Nadara Mustard, MD;  Location: [facility] OR;  Service: Orthopedics;  Laterality: Right;   AMPUTATION Right 07/04/2022  Procedure: REVISION AMPUTATION BELOW KNEE;  Surgeon: Nadara Mustard, MD;  Location: [facility] OR;  Service: Orthopedics;  Laterality: Right;   APPLICATION OF WOUND VAC  05/23/2022   Procedure: APPLICATION OF WOUND VAC;  Surgeon: Nadara Mustard, MD;  Location: [facility] OR;  Service: Orthopedics;;   AV FISTULA PLACEMENT     CARDIAC CATHETERIZATION  02/17/2018   LHC 02/17/18 ([name]): 30% pLAD, 30% dRCA, LVEDP 25-30, LVEF 25%.     CATARACT EXTRACTION W/PHACO Right  03/29/2020   Procedure: CATARACT EXTRACTION PHACO AND INTRAOCULAR LENS PLACEMENT (IOC)  RIGHT DIABETIC;  Surgeon: Lockie Mola, MD;  Location: [facility] ORS;  Service: Ophthalmology;  Laterality: Right;  Lot # [id number] H [place]: 02:30.9 [facility]% 9.5% CDE: 14.38   EYE SURGERY Right    cataract removed   I & D EXTREMITY Right 02/02/2022   Procedure: IRRIGATION AND DEBRIDEMENT RIGHT FOOT;  Surgeon: Louann Sjogren, MD;  Location: [facility] OR;  Service: Podiatry;  Laterality: Right;   INCISION AND DRAINAGE OF WOUND Right 02/08/2022   Procedure: IRRIGATION AND DEBRIDEMENT WOUND;  Surgeon: Asencion Islam, DPM;  Location: [facility] OR;  Service: Podiatry;  Laterality: Right;  with removal of infected bone    INSERTION OF DIALYSIS CATHETER  08/13/2019   Procedure: Insertion Of Dialysis Catheter;  Surgeon: Larina Earthly, MD;  Location: [facility] OR;  Service: Vascular;;   INSERTION OF DIALYSIS CATHETER Left 07/19/2020   Procedure: INSERTION OF DIALYSIS CATHETER;  Surgeon: Maeola Harman, MD;  Location: [facility] OR;  Service: Vascular;  Laterality: Left;   IR US GUIDE BX ASP/DRAIN  11/07/2022   IRRIGATION AND DEBRIDEMENT ABSCESS     PERIPHERAL VASCULAR BALLOON ANGIOPLASTY  08/15/2019   Procedure: PERIPHERAL VASCULAR BALLOON ANGIOPLASTY;  Surgeon: Maeola Harman, MD;  Location: [facility] INVASIVE CV LAB;  Service: Cardiovascular;;   PERIPHERAL VASCULAR BALLOON ANGIOPLASTY Left 02/06/2022   Procedure: PERIPHERAL VASCULAR BALLOON ANGIOPLASTY;  Surgeon: Cephus Shelling, MD;  Location: [facility] INVASIVE CV LAB;  Service: Cardiovascular;  Laterality: Left;   REVISON OF ARTERIOVENOUS FISTULA Left 07/19/2020   Procedure: REVISON/PLICATION OF ARTERIOVENOUS FISTULA LEFT;  Surgeon: Maeola Harman, MD;  Location: [facility] OR;  Service: Vascular;  Laterality: Left;   THROMBECTOMY AND REVISION OF ARTERIOVENTOUS (AV) GORETEX  GRAFT Left 08/13/2019   Procedure: THROMBECTOMY OF LEFT ARM ARTERIOVENTOUS (AV) FISTULA;  Surgeon: Larina Earthly, MD;   Location: [facility] OR;  Service: Vascular;  Laterality: Left;   TOE AMPUTATION     Social History:  reports that he has never smoked. He has never used smokeless tobacco. He reports that he does not currently use alcohol. He reports that he does not use drugs.  Allergies  Allergen Reactions   Sulfa Antibiotics Diarrhea and Nausea And Vomiting    Family History  Problem Relation Age of Onset   Hypertension Mother     Prior to Admission medications   Medication Sig Start Date End Date Taking? Authorizing Provider  acetaminophen (TYLENOL) 500 MG tablet Take 1,000 mg by mouth 2 (two) times daily as needed for moderate pain (pain score 4-6), headache or fever.    [provider]  B Complex-C-Folic Acid (DIALYVITE 800) 0.8 MG TABS Take 1 tablet by mouth daily.    [provider]  cinacalcet (SENSIPAR) 30 MG tablet Take 30 mg by mouth daily.    [provider]  insulin isophane & regular human KwikPen (NOVOLIN 70/30 KWIKPEN) (70-30) 100 UNIT/ML KwikPen Inject 30U in the morning and 25U in the evening 10/27/23  Briant Cedar, MD  Insulin Pen Needle 32G X 4 MM MISC Use with insulin pen. 10/28/23   Briant Cedar, MD  lidocaine-prilocaine (EMLA) cream Apply 1 Application topically Every Tuesday,Thursday,and Saturday with dialysis.    [provider]  sevelamer carbonate (RENVELA) 800 MG tablet Take 2,400-4,000 mg by mouth See admin instructions. Take 5 tablets (4000mg ) three times daily with meals and 3 tablets (2400mg ) twice daily with snacks.    [provider]    Physical Exam: Vitals:   11/09/23 1100 11/09/23 1115 11/09/23 1130 11/09/23 1140  BP: 116/68 120/80 (!) 93/34 (!) 126/56  Pulse: 86 83 88 89  Resp:  (!) 22    Temp:  98.3 F (36.8 C)    TempSrc:  Oral    SpO2: 93% 94% 90% 93%  Weight:      Height:       Neurology somnolent but easy to arouse, non focal, able to follow simple commands and respond to simple questions ENT  with mild pallor Cardiovascular with S1 and S2 present and regular with no gallops, rubs or murmurs Respiratory with no rales or wheezing, no rhonchi Abdomen with mild distention, mild tender to deep palpation with no rebound or guarding  No lower extremity edema  Right BKA with prosthesis in place, no left edema  Data Reviewed:   Na 130, K 4,5 CL 91, bicarbonate 26 glucose 290, bun 28 cr 7,5  High sensitive troponin 7,689- 7,744- 8,442- 8,849  Wbc 6,3 hgb 14.4 plt 176   Head CT with no acute changes, known chronic lacunar infarcts within bilateral deep gray nuclei. Mild cerebral white matter chronic small vessel ischemic disease.   CT chest with no findings of acute pulmonary embolism. Bibasilar atelectasis, with no infiltrates or effusions.   Chest radiograph with cardiomegaly, hypoinflation with bilateral hilar vascular congestion with no infiltrates or effusions.   EKG 80 bpm, left axis deviation, interventricular conduction delay, qtc 513, sinus rhythm with 1st degree AV block with no significant ST segment or T wave changes.   Assessment and Plan: * NSTEMI (non-ST elevated myocardial infarction) [facility]) Patient currently is chest pain free. High sensitive troponin continue to trend up.   Plan to continue anticoagulation with IV heparin Follow up on echocardiogram for possible new wall motion abnormalities or worsening LV systolic function. Continue antiplatelet therapy with aspirin and continue with statin.   ESRD on dialysis [facility]) Patient had HD yesterday, apparently stopped early than planned.  Today with no signs of hypervolemia, no hyperkalemia, acidosis or uremia.   Plan to continue close monitoring of electrolytes and volume status Routine consult Nephrology when patient arrives at [facility]  Acute on chronic systolic CHF (congestive heart failure) (HCC) Patient with no signs of acute decompensation Plan to continue blood pressure monitoring Follow up on new echocardiogram.   Continue telemetry monitoring.   HTN (hypertension) Blood pressure has improved. Continue close monitoring, no current indication for antihypertensive agents.   Acute metabolic encephalopathy Multifactorial encephalopathy.  Continue supportive medical care. Head CT with no acute bleed or new CVA  Continue neuro checks q 4 hrs  If worsening or not improving may need further work up with brain MRI. Patient had severe hypotension on admission, that can be culprit of new CVA.   DM2 (diabetes mellitus, type 2) (HCC) Add insulin sliding scale for glucose cover and monitoring Patient with poor oral intake due to altered cognition.   History of CVA  Continue blood pressure monitoring and neuro checks.  Avoid hypotension.  Continue asa and statin   Protein-calorie malnutrition, mild (HCC) Consult nutrition for recommendations.   Obesity (BMI 30-39.9) Calculated BMI 32.9 consistent with obesity class 1       Advance Care Planning:   Code Status: Full Code   Consults: Cardiology   Family Communication: no family at the bedside   Severity of Illness: The appropriate patient status for this patient is INPATIENT. Inpatient status is judged to be reasonable and necessary in order to provide the required intensity of service to ensure the patient's safety. The patient's presenting symptoms, physical exam findings, and initial radiographic and laboratory data in the context of their chronic comorbidities is felt to place them at high risk for further clinical deterioration. Furthermore, it is not anticipated that the patient will be medically stable for discharge from the hospital within 2 midnights of admission.   * I certify that at the point of admission it is my clinical judgment that the patient will require inpatient hospital care spanning beyond 2 midnights from the point of admission due to high intensity of service, high risk for further deterioration and high frequency of  surveillance required.*  Author: Coralie Keens, MD 11/09/2023 2:08 PM  For on call review [email or web address].

## 2023-11-09 NOTE — Plan of Care (Signed)
  Problem: Activity: Goal: Risk for activity intolerance will decrease Outcome: Progressing   Problem: Coping: Goal: Level of anxiety will decrease Outcome: Progressing   Problem: Safety: Goal: Ability to remain free from injury will improve Outcome: Progressing   Problem: Skin Integrity: Goal: Risk for impaired skin integrity will decrease Outcome: Progressing   

## 2023-11-09 NOTE — Progress Notes (Signed)
   Progress Note  Patient Name: [name] OFFIELD Sr. Date of Encounter: 11/09/2023  Dr. Ella Jubilee assuming care of patient on the hospitalist team with cardiology following in consultation.  Bed pending at [facility] for transfer.  Head CT per ER staff reports no acute intracranial process with chronic lacunar infarcts noted.  Follow-up echocardiogram shows reduction in LVEF, now 30 to 35% range with global hypokinesis and basal inferoposterior akinesis.  Restrictive diastolic filling is also noted.  Mildly elevated RVSP.  Aortic stenosis looks to be potentially severe, low-flow/low gradient with dimensionless index 0.19 (reported mild to moderate by echocardiogram earlier in the month).  Borderline dilatation of the aortic root at 40 mm.  Ultimately cardiac catheterization will be considered for evaluation of coronary anatomy and further assessment of aortic stenosis, presuming mental status clears and he is able to consent, and other medical issues are stable.  For questions or updates, please contact [facility] Please consult [email or web address] for contact info under   Signed, Nona Dell, MD  11/09/2023, 1:44 PM

## 2023-11-09 NOTE — Consult Note (Signed)
PHARMACY - ANTICOAGULATION CONSULT NOTE  Pharmacy Consult for Heparin infusion Indication: chest pain/ACS  Allergies  Allergen Reactions   Sulfa Antibiotics Diarrhea and Nausea And Vomiting    Patient Measurements: Height: 6\' 1"  (185.4 cm) Weight: 113.4 kg (250 lb) IBW/kg (Calculated) : 79.9 Heparin Dosing Weight: 103.9 kg  Vital Signs: Temp: 98.3 F (36.8 C) (12/23 1115) Temp Source: Oral (12/23 1115) BP: 126/56 (12/23 1140) Pulse Rate: 89 (12/23 1140)  Labs: Recent Labs    11/08/23 1658 11/08/23 1830 11/09/23 0328 11/09/23 1111  HGB 14.4  --  14.0  --   HCT 44.6  --  44.2  --   PLT 176  --  170  --   APTT  --  28  --   --   LABPROT  --  15.2  --   --   INR  --  1.2  --   --   HEPARINUNFRC  --   --  0.10* 0.39  CREATININE 7.50*  --   --   --   TROPONINIHS 7,689* 2,585*  --  8,442*    Estimated Creatinine Clearance: 13.6 mL/min (A) (by C-G formula based on SCr of 7.5 mg/dL (H)).   Medical History: Past Medical History:  Diagnosis Date   ESRD on hemodialysis (HCC)    a. HD MWF > 10 years.   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 01/2022 Echo: EF 45-50%, glob HK, mod LVH, mildly reduced RV fxn, mild MR, mod-sev MV Ca2+. Mild AS; b. 10/2023 Echo: EF 45-50%, glob HK, GrII DD, mildly reduced RV fxn, RVSP 52.26mmHg. Mild MR/MS, mod TR.   Hypertension    Nonischemic cardiomyopathy (HCC)    a. 2019 Cath [facility]): Nonobstructive disease; b. 12/2019 Echo: EF 50%; c. 12/2019 MV (Tx w/u [facility]): Ant infarct w/ peri-infarct ischemia. EF 35%; c. 05/2021 MV (Tx w/u [facility]): Cor Ca2+, EF 53%, motion artifact, equiv study, likely low risk.   Nonobstructive coronary artery disease    a.  2019 Cath ([place]): Left main normal, LAD 30 proximal, RCA 30 distal, EF 25%.   Obesity    Type 2 diabetes mellitus (HCC)    a. 10/2023 - admission w/ HHS.    Medications:  No AC prior to admission per med list and fill history.    Assessment: Patient  brought to ED by EMS for near syncope and shortness of breath during HD. ESRD on dialysis TTS, chronic HFmrEF, insulin-dependent diabetes, hypertension, PVD status post right BKA, history of stroke, hypergammaglobulinemia, hyperferritinemia. Pharmacy consulted to initiate and manage heparin infusion for ACS.  HL 0.39-therapeutic Trop 8,442 CBC WNL  Goal of Therapy:  Heparin level 0.3-0.7 units/ml Monitor platelets by anticoagulation protocol: Yes   Plan:  Continue heparin infusion at 1600 units/hr Check anti-Xa level in 8 hours and daily Continue to monitor H&H and platelets  Judeth Cornfield, PharmD Clinical Pharmacist 11/09/2023 12:04 PM

## 2023-11-09 NOTE — Assessment & Plan Note (Signed)
Blood pressure has improved. Continue close monitoring, no current indication for antihypertensive agents.

## 2023-11-09 NOTE — ED Notes (Signed)
Pt keeps taking his nasal cannula off because it is drying his nose out. Nurse informed him that he needs to keep it on otherwise his O2 drops when he falls asleep.

## 2023-11-09 NOTE — Assessment & Plan Note (Signed)
Calculated BMI 32.9 consistent with obesity class 1

## 2023-11-09 NOTE — Hospital Course (Signed)
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 Overall appears similar to prior study in 2021 with a mildly reduced LVEF of ~45%. As far as ischemic evaluation, he underwent a SPECT stress in February 2021 which was read as probable ischemia, but also suspicious for artifact given his LHC in 2019 (performed in [place], [place]) showing nonobstructive CAD. His most recent TTE on file was in 12/2019 and was read as having a borderline LVEF of ~50% and moderate-to-severe LVH, but his LVEF was reportedly as low as 35-40% back in 2019. He was deemed an acceptable candidate for kidney transplant from a cardiology perspective when seen by Dr. Jon Billings in 10/2020. He had a repeat TTE performed just prior to today's visit. No ischemic symptoms reported this afternoon. - still need to have outside LHC films [employer] for review --> I sent request to [facility] to have these images pushed into Epic - follow-up final echocardiogram results obtained today (7/7) - continue with follow-up with [facility] nephrology as indicated for additional kidney transplant evaluation

## 2023-11-09 NOTE — Assessment & Plan Note (Signed)
Patient had HD yesterday, apparently stopped early than planned.  Today with no signs of hypervolemia, no hyperkalemia, acidosis or uremia.   Plan to continue close monitoring of electrolytes and volume status Routine consult Nephrology when patient arrives at [facility]

## 2023-11-09 NOTE — ED Notes (Signed)
Pt's blood pressure drops when he raises arm above his head.

## 2023-11-09 NOTE — Assessment & Plan Note (Signed)
Add insulin sliding scale for glucose cover and monitoring Patient with poor oral intake due to altered cognition.

## 2023-11-09 NOTE — Assessment & Plan Note (Signed)
Patient currently is chest pain free. High sensitive troponin continue to trend up.   Plan to continue anticoagulation with IV heparin Follow up on echocardiogram for possible new wall motion abnormalities or worsening LV systolic function. Continue antiplatelet therapy with aspirin and continue with statin.

## 2023-11-10 ENCOUNTER — Telehealth (HOSPITAL_COMMUNITY): Payer: Self-pay | Admitting: Pharmacy Technician

## 2023-11-10 ENCOUNTER — Inpatient Hospital Stay (INDEPENDENT_AMBULATORY_CARE_PROVIDER_SITE_OTHER): Payer: Medicare Other | Admitting: Primary Care

## 2023-11-10 ENCOUNTER — Inpatient Hospital Stay (HOSPITAL_COMMUNITY): Payer: Medicare Other

## 2023-11-10 ENCOUNTER — Other Ambulatory Visit (HOSPITAL_COMMUNITY): Payer: Self-pay

## 2023-11-10 DIAGNOSIS — A419 Sepsis, unspecified organism: Secondary | ICD-10-CM

## 2023-11-10 DIAGNOSIS — I959 Hypotension, unspecified: Secondary | ICD-10-CM

## 2023-11-10 DIAGNOSIS — I4892 Unspecified atrial flutter: Secondary | ICD-10-CM

## 2023-11-10 DIAGNOSIS — I350 Nonrheumatic aortic (valve) stenosis: Secondary | ICD-10-CM

## 2023-11-10 DIAGNOSIS — I2510 Atherosclerotic heart disease of native coronary artery without angina pectoris: Secondary | ICD-10-CM | POA: Diagnosis not present

## 2023-11-10 DIAGNOSIS — I214 Non-ST elevation (NSTEMI) myocardial infarction: Secondary | ICD-10-CM | POA: Diagnosis not present

## 2023-11-10 DIAGNOSIS — I5020 Unspecified systolic (congestive) heart failure: Secondary | ICD-10-CM

## 2023-11-10 DIAGNOSIS — N186 End stage renal disease: Secondary | ICD-10-CM | POA: Diagnosis not present

## 2023-11-10 DIAGNOSIS — R6521 Severe sepsis with septic shock: Secondary | ICD-10-CM

## 2023-11-10 DIAGNOSIS — I2583 Coronary atherosclerosis due to lipid rich plaque: Secondary | ICD-10-CM

## 2023-11-10 DIAGNOSIS — G9341 Metabolic encephalopathy: Secondary | ICD-10-CM | POA: Diagnosis not present

## 2023-11-10 LAB — COMPREHENSIVE METABOLIC PANEL
ALT: 67 U/L — ABNORMAL HIGH (ref 0–44)
AST: 117 U/L — ABNORMAL HIGH (ref 15–41)
Albumin: 3.5 g/dL (ref 3.5–5.0)
Alkaline Phosphatase: 135 U/L — ABNORMAL HIGH (ref 38–126)
Anion gap: 22 — ABNORMAL HIGH (ref 5–15)
BUN: 32 mg/dL — ABNORMAL HIGH (ref 8–23)
CO2: 19 mmol/L — ABNORMAL LOW (ref 22–32)
Calcium: 9.7 mg/dL (ref 8.9–10.3)
Chloride: 89 mmol/L — ABNORMAL LOW (ref 98–111)
Creatinine, Ser: 8.8 mg/dL — ABNORMAL HIGH (ref 0.61–1.24)
GFR, Estimated: 6 mL/min — ABNORMAL LOW (ref >60)
Glucose, Bld: 196 mg/dL — ABNORMAL HIGH (ref 70–99)
Potassium: 5.9 mmol/L — ABNORMAL HIGH (ref 3.5–5.1)
Sodium: 130 mmol/L — ABNORMAL LOW (ref 135–145)
Total Bilirubin: 1 mg/dL (ref <1.2)
Total Protein: 9.3 g/dL — ABNORMAL HIGH (ref 6.5–8.1)

## 2023-11-10 LAB — RENAL FUNCTION PANEL
Albumin: 3.4 g/dL — ABNORMAL LOW (ref 3.5–5.0)
Anion gap: 18 — ABNORMAL HIGH (ref 5–15)
BUN: 43 mg/dL — ABNORMAL HIGH (ref 8–23)
CO2: 21 mmol/L — ABNORMAL LOW (ref 22–32)
Calcium: 9.9 mg/dL (ref 8.9–10.3)
Chloride: 90 mmol/L — ABNORMAL LOW (ref 98–111)
Creatinine, Ser: 11.47 mg/dL — ABNORMAL HIGH (ref 0.61–1.24)
GFR, Estimated: 5 mL/min — ABNORMAL LOW (ref >60)
Glucose, Bld: 262 mg/dL — ABNORMAL HIGH (ref 70–99)
Phosphorus: 6.3 mg/dL — ABNORMAL HIGH (ref 2.5–4.6)
Potassium: >7.5 mmol/L (ref 3.5–5.1)
Sodium: 129 mmol/L — ABNORMAL LOW (ref 135–145)

## 2023-11-10 LAB — CBC
HCT: 48.3 % (ref 39.0–52.0)
Hemoglobin: 15.2 g/dL (ref 13.0–17.0)
MCH: 28.9 pg (ref 26.0–34.0)
MCHC: 31.5 g/dL (ref 30.0–36.0)
MCV: 91.8 fL (ref 80.0–100.0)
Platelets: 161 10*3/uL (ref 150–400)
RBC: 5.26 MIL/uL (ref 4.22–5.81)
RDW: 15.6 % — ABNORMAL HIGH (ref 11.5–15.5)
WBC: 8.1 10*3/uL (ref 4.0–10.5)
nRBC: 0 % (ref 0.0–0.2)

## 2023-11-10 LAB — CBC WITH DIFFERENTIAL/PLATELET
Abs Immature Granulocytes: 0.04 10*3/uL (ref 0.00–0.07)
Basophils Absolute: 0.1 10*3/uL (ref 0.0–0.1)
Basophils Relative: 1 %
Eosinophils Absolute: 0 10*3/uL (ref 0.0–0.5)
Eosinophils Relative: 0 %
HCT: 47.4 % (ref 39.0–52.0)
Hemoglobin: 15.3 g/dL (ref 13.0–17.0)
Immature Granulocytes: 0 %
Lymphocytes Relative: 9 %
Lymphs Abs: 0.8 10*3/uL (ref 0.7–4.0)
MCH: 28.9 pg (ref 26.0–34.0)
MCHC: 32.3 g/dL (ref 30.0–36.0)
MCV: 89.4 fL (ref 80.0–100.0)
Monocytes Absolute: 2.1 10*3/uL — ABNORMAL HIGH (ref 0.1–1.0)
Monocytes Relative: 22 %
Neutro Abs: 6.6 10*3/uL (ref 1.7–7.7)
Neutrophils Relative %: 68 %
Platelets: 179 10*3/uL (ref 150–400)
RBC: 5.3 MIL/uL (ref 4.22–5.81)
RDW: 15.7 % — ABNORMAL HIGH (ref 11.5–15.5)
WBC: 9.6 10*3/uL (ref 4.0–10.5)
nRBC: 0.3 % — ABNORMAL HIGH (ref 0.0–0.2)

## 2023-11-10 LAB — HEPARIN LEVEL (UNFRACTIONATED)
Heparin Unfractionated: >1.1 [IU]/mL — ABNORMAL HIGH (ref 0.30–0.70)
Heparin Unfractionated: 0.73 [IU]/mL — ABNORMAL HIGH (ref 0.30–0.70)

## 2023-11-10 LAB — TROPONIN I (HIGH SENSITIVITY)
Troponin I (High Sensitivity): 8942 ng/L (ref <18)
Troponin I (High Sensitivity): 7282 ng/L (ref <18)

## 2023-11-10 LAB — GLUCOSE, CAPILLARY
Glucose-Capillary: 187 mg/dL — ABNORMAL HIGH (ref 70–99)
Glucose-Capillary: 205 mg/dL — ABNORMAL HIGH (ref 70–99)
Glucose-Capillary: 241 mg/dL — ABNORMAL HIGH (ref 70–99)

## 2023-11-10 MED ORDER — VANCOMYCIN HCL IN DEXTROSE 1-5 GM/200ML-% IV SOLN
1000.0000 mg | Freq: Once | INTRAVENOUS | Status: AC
  Administered 2023-11-10: 1000 mg via INTRAVENOUS
  Filled 2023-11-10: qty 200

## 2023-11-10 MED ORDER — HEPARIN SODIUM (PORCINE) 1000 UNIT/ML DIALYSIS
6000.0000 [IU] | Freq: Once | INTRAMUSCULAR | Status: AC
  Administered 2023-11-10: 6000 [IU] via INTRAVENOUS_CENTRAL
  Filled 2023-11-10: qty 6

## 2023-11-10 MED ORDER — PENTAFLUOROPROP-TETRAFLUOROETH EX AERO: 1.0000 | INHALATION_SPRAY | CUTANEOUS | Status: DC | PRN

## 2023-11-10 MED ORDER — ORAL CARE MOUTH RINSE: 15.0000 mL | OROMUCOSAL | Status: DC | PRN

## 2023-11-10 MED ORDER — SODIUM CHLORIDE 0.9 % IV SOLN
1.0000 g | INTRAVENOUS | Status: DC
  Filled 2023-11-10: qty 10

## 2023-11-10 MED ORDER — VANCOMYCIN HCL IN DEXTROSE 1-5 GM/200ML-% IV SOLN
1000.0000 mg | INTRAVENOUS | Status: DC
  Administered 2023-11-12 – 2023-11-14 (×2): 1000 mg via INTRAVENOUS
  Filled 2023-11-10 (×4): qty 200

## 2023-11-10 MED ORDER — LIDOCAINE-PRILOCAINE 2.5-2.5 % EX CREA: 1.0000 | TOPICAL_CREAM | CUTANEOUS | Status: DC | PRN

## 2023-11-10 MED ORDER — MUPIROCIN 2 % EX OINT
1.0000 | TOPICAL_OINTMENT | Freq: Two times a day (BID) | CUTANEOUS | Status: AC
  Administered 2023-11-10 – 2023-11-14 (×8): 1 via NASAL
  Filled 2023-11-10 (×3): qty 22

## 2023-11-10 MED ORDER — CHLORHEXIDINE GLUCONATE CLOTH 2 % EX PADS: 6.0000 | MEDICATED_PAD | Freq: Every day | CUTANEOUS | Status: DC

## 2023-11-10 MED ORDER — ACETAMINOPHEN 10 MG/ML IV SOLN
1000.0000 mg | Freq: Once | INTRAVENOUS | Status: AC
  Administered 2023-11-11: 1000 mg via INTRAVENOUS
  Filled 2023-11-10: qty 100

## 2023-11-10 MED ORDER — HEPARIN SODIUM (PORCINE) 1000 UNIT/ML DIALYSIS: 1000.0000 [IU] | INTRAMUSCULAR | Status: DC | PRN

## 2023-11-10 MED ORDER — MUPIROCIN 2 % EX OINT: 1.0000 | TOPICAL_OINTMENT | Freq: Two times a day (BID) | CUTANEOUS | Status: DC

## 2023-11-10 MED ORDER — MIDODRINE HCL 5 MG PO TABS
10.0000 mg | ORAL_TABLET | Freq: Once | ORAL | Status: AC
  Administered 2023-11-10: 10 mg via ORAL
  Filled 2023-11-10: qty 2

## 2023-11-10 MED ORDER — LIDOCAINE HCL (PF) 1 % IJ SOLN: 5.0000 mL | INTRAMUSCULAR | Status: DC | PRN

## 2023-11-10 MED ORDER — SODIUM CHLORIDE 0.9 % IV SOLN
2.0000 g | INTRAVENOUS | Status: DC
  Administered 2023-11-10: 2 g via INTRAVENOUS
  Filled 2023-11-10 (×2): qty 12.5

## 2023-11-10 MED ORDER — CHLORHEXIDINE GLUCONATE CLOTH 2 % EX PADS
6.0000 | MEDICATED_PAD | Freq: Every day | CUTANEOUS | Status: AC
  Administered 2023-11-10 – 2023-11-13 (×4): 6 via TOPICAL

## 2023-11-10 MED ORDER — LACTATED RINGERS IV BOLUS
500.0000 mL | Freq: Once | INTRAVENOUS | Status: AC
  Administered 2023-11-10: 500 mL via INTRAVENOUS

## 2023-11-10 MED ORDER — LACTATED RINGERS IV BOLUS
500.0000 mL | Freq: Once | INTRAVENOUS | Status: AC
  Administered 2023-11-11: 500 mL via INTRAVENOUS

## 2023-11-10 NOTE — Procedures (Signed)
HD Note:  Some information was entered later than the data was gathered due to patient care needs. The stated time with the data is accurate.  Received patient in bed to unit.   Alert and oriented.  Patient states he doesn't feel well and is slow to respond to requests and questions  Informed consent signed and in chart.   Access used: Upper left arm Access issues: venous pressure stayed higher.  Repositioned the access arm, validated the needles were in effective placement.  BFR reduced to 300. Patient coughing also raised both pressures periodically.   Patient's O2 sats were in upper 80's/low 90's at start of treatment.  O2 applied and was effective.  Patient's BP lowered with UF pull for goal of 5000 ml.  Goal reduced by order of Dr. Charlestine Massed to 3000 ml.  Patient's BP recovered and goal was set back to 5000 ml.  Alonna Buckler, PA order.  TX duration: 3.5 hours  Alert, without acute distress.  Total UF removed: 5000 ml   Hand-off given to patient's nurse.   Transported back to the room   Thelonious Kauffmann L. Dareen Piano, RN Kidney Dialysis Unit.

## 2023-11-10 NOTE — Progress Notes (Signed)
MEWS Progress Note  Patient Details Name: [name] BRIAR Sr. MRN: [id number] DOB: 07-20-1961 Today's Date: 11/10/2023   MEWS Flowsheet Documentation:  Assess: MEWS Score Temp: (!) 103.1 F (39.5 C) BP: (!) 143/65 MAP (mmHg): 85 Pulse Rate: (!) 103 ECG Heart Rate: (!) 104 Resp: (!) 23 Level of Consciousness: Responds to Voice SpO2: 100 % O2 Device: Nasal Cannula Patient Activity (if Appropriate): In bed O2 Flow Rate (L/min): 2 L/min Assess: MEWS Score MEWS Temp: 2 MEWS Systolic: 0 MEWS Pulse: 1 MEWS RR: 1 MEWS LOC: 1 MEWS Score: 5 MEWS Score Color: Red Assess: SIRS CRITERIA SIRS Temperature : 1 SIRS Respirations : 1 SIRS Pulse: 1 SIRS WBC: 0 SIRS Score Sum : 3 SIRS Temperature : 1 SIRS Pulse: 1 SIRS Respirations : 1 SIRS WBC: 0 SIRS Score Sum : 3 Assess: if the MEWS score is Yellow or Red Were vital signs accurate and taken at a resting state?: Yes Does the patient meet 2 or more of the SIRS criteria?: Yes Does the patient have a confirmed or suspected source of infection?: No MEWS guidelines implemented : Yes, red Treat MEWS Interventions: Considered administering scheduled or prn medications/treatments as ordered Take Vital Signs Increase Vital Sign Frequency : Red: Q1hr x2, continue Q4hrs until patient remains green for 12hrs Escalate MEWS: Escalate: Red: Discuss with charge nurse and notify provider. Consider notifying RRT. If remains red for 2 hours consider need for higher level of care Notify: Charge Nurse/RN Name of Charge Nurse/RN Notified: Porfirio Oar, RN Provider Notification Provider Name/Title: Kirke Corin Date Provider Notified: 11/10/23 Time Provider Notified: 1320 Method of Notification:  (securechat) Notification Reason: Change in status, Other (Comment) (Red MEWS, Temp >101) Provider response: En route Date of Provider Response: 11/10/23 Time of Provider Response: [id number]      Dymond Spreen E Jerlisa Diliberto 11/10/2023, 2:01 PM

## 2023-11-10 NOTE — Progress Notes (Signed)
PT returned from HD at approx 1300, this RN assisted transport with pt back into room.  Heparin IV site noted to be infiltrated, large swollen area, and IV barely in.  IV removed and manual pressure held due to bleeding.  Heparin resumed at other IV site.  Infiltrated area less swollen after manual pressure.  Pharmacy and MD made aware of infiltration.  Pt incont of bowel from his waist to the end of the bed, pt with BUE tremors, opening eyes slightly to voice, following minimal simple commands.  Incont care/peri care provided, all linen changed.  VS obtained, temp up to 103.1,  RR and heart rate elevated.  MEWS of 6, red.  MD and charge nurse made aware.  MD states he will be seeing pt shortly.  Pt unable to swallow water safely, all AM meds held.  Rectal tylenol given for fever.  Red MEWS interventions implemented.  Bed alarm on, call bell within reach, frequent monitoring continued

## 2023-11-10 NOTE — Progress Notes (Signed)
[facility] Progress Note   Subjective: Seen on HD. Had to adjust UF goal D/T hypotension. More alert today, denies chest pain/SOB however O2 sats down, PRN O2 in use.     Objective Vitals:   11/10/23 0727 11/10/23 0744 11/10/23 0758 11/10/23 0813  BP: (!) 111/50 (!) 98/39 (!) 86/36 134/71  Pulse: 76 79 87 90  Resp: (!) 30 20  (!) 21  Temp: 98.1 F (36.7 C)     TempSrc:      SpO2:  (!) 89% (!) 88% 98%  Weight: 111.4 kg 111.4 kg    Height:       General: Chronically ill appearing male in no acute distress. Head: Normocephalic, atraumatic, sclera non-icteric, mucus membranes are moist Neck: Supple. JVD 1/4 to mandible Lungs: Bilateral breath sounds decreased in bases R>L with few bibasilar crackles. Heart: SR with S1 S2 3/6 systolic M 5th ICS MCL. No R/G.  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 no edema LLE Neuro: Alert and oriented X 1 Psych:  Lethargic, slow to respond to verbal stimuli but more alert than 12/23 Dialysis Access: L AVF + T/B  Additional Objective Labs: Basic Metabolic Panel: Recent Labs  Lab 11/08/23 1658 11/09/23 1945 11/10/23 0359  NA 130*  --  129*  K 4.5  --  >7.5*  CL 91*  --  90*  CO2 26  --  21*  GLUCOSE 290*  --  262*  BUN 28*  --  43*  CREATININE 7.50* 10.49* 11.47*  CALCIUM 10.3  --  9.9  PHOS  --   --  6.3*   Liver Function Tests: Recent Labs  Lab 11/08/23 1658 11/10/23 0359  AST 44*  --   ALT 34  --   ALKPHOS 127*  --   BILITOT 0.6  --   PROT 8.3*  --   ALBUMIN 3.3* 3.4*   No results for input(s): "LIPASE", "AMYLASE" in the last 168 hours. CBC: Recent Labs  Lab 11/08/23 1658 11/09/23 0328 11/09/23 1945 11/10/23 0359  WBC 6.3 7.0 8.1 8.1  NEUTROABS 3.7  --   --   --   HGB 14.4 14.0 15.1 15.2  HCT 44.6 44.2 47.9 48.3  MCV 91.6 92.1 91.9 91.8  PLT 176 170 173 161   Blood Culture    Component Value Date/Time   SDES  07/02/2022 0100    BLOOD RIGHT FOREARM Performed at [facility], [address], [place], [place] [address]    SPECREQUEST  07/02/2022 0100    BOTTLES DRAWN AEROBIC ONLY Blood Culture adequate volume Performed at [facility], [address], [place], [place] [address]    CULT  07/02/2022 0100    NO GROWTH 5 DAYS Performed at [facility], [address] [place], [place] [address]    REPTSTATUS 07/07/2022 FINAL 07/02/2022 0100    Cardiac Enzymes: No results for input(s): "CKTOTAL", "CKMB", "CKMBINDEX", "TROPONINI" in the last 168 hours. CBG: Recent Labs  Lab 11/09/23 1634 11/09/23 2209  GLUCAP 244* 276*   Iron Studies: No results for input(s): "IRON", "TIBC", "TRANSFERRIN", "FERRITIN" in the last 72 hours. @lablastinr3 @ Studies/Results: ECHOCARDIOGRAM COMPLETE Result Date: 11/09/2023    ECHOCARDIOGRAM REPORT   Patient Name:   BRENDYN SLOMAN Sr. Date of Exam: 11/09/2023 Medical Rec #:  [id number]         Height:       73.0 in Accession #:    [id number]  Weight:       250.0 lb Date of Birth:  Mar 14, 1961        BSA:          2.366 m Patient Age:    62 years          BP:           126/56 mmHg Patient Gender: M                 HR:           89 bpm. [facility] Procedure: 2D Echo, Cardiac Doppler and Color Doppler Indications:    Chest Pain R07.9  History:        Patient has prior history of Echocardiogram examinations, most                 recent 10/25/2023. Cardiomyopathy and CHF, Previous Myocardial                 Infarction and CAD; Risk Factors:Hypertension and Diabetes.  Sonographer:    Celesta Gentile RCS Referring Phys: [phone] CHRISTOPHER RONALD BERGE IMPRESSIONS  1. Left ventricular ejection fraction, by estimation, is 30 to 35%. The left ventricle has moderately decreased function. The left ventricle demonstrates regional wall motion abnormalities (see scoring diagram/findings for description). There is moderate concentric left ventricular hypertrophy. Left ventricular diastolic  parameters are consistent with Grade III diastolic dysfunction (restrictive).  2. Right ventricular systolic function is mildly reduced. The right ventricular size is normal. There is mildly elevated pulmonary artery systolic pressure. The estimated right ventricular systolic pressure is 42.2 mmHg.  3. Left atrial size was moderately dilated.  4. The mitral valve is degenerative. Trivial mitral valve regurgitation. Moderate mitral annular calcification.  5. The aortic valve is tricuspid. There is moderate calcification of the aortic valve. Aortic valve regurgitation is not visualized. Moderate to severe aortic valve stenosis - suspect upper end of scale, low flow/low gradient. Aortic valve area, by VTI measures 0.98 cm. Aortic valve mean gradient measures 22.0 mmHg. Dimentionless index 0.19.  6. Aortic dilatation noted. There is borderline dilatation of the aortic root, measuring 40 mm.  7. The inferior vena cava is dilated in size with <50% respiratory variability, suggesting right atrial pressure of 15 mmHg. Comparison(s): Prior images reviewed side by side. LVEF has decreased, 30-35% range with basal inferoposterior akinesis and restrictive diastolic filling. Aortic stenosis possibly severe (low flow/low gradient). Mildly elevated RVSP. FINDINGS  Left Ventricle: Left ventricular ejection fraction, by estimation, is 30 to 35%. The left ventricle has moderately decreased function. The left ventricle demonstrates regional wall motion abnormalities. The left ventricular internal cavity size was normal in size. There is moderate concentric left ventricular hypertrophy. Left ventricular diastolic parameters are consistent with Grade III diastolic dysfunction (restrictive).  LV Wall Scoring: The inferior wall, posterior wall, and basal anterolateral segment are akinetic. The entire anterior wall, entire septum, entire apex, and mid anterolateral segment are hypokinetic. Right Ventricle: The right ventricular size is  normal. No increase in right ventricular wall thickness. Right ventricular systolic function is mildly reduced. There is mildly elevated pulmonary artery systolic pressure. The tricuspid regurgitant velocity  is 2.61 m/s, and with an assumed right atrial pressure of 15 mmHg, the estimated right ventricular systolic pressure is 42.2 mmHg. Left Atrium: Left atrial size was moderately dilated. Right Atrium: Right atrial size was normal in size. Pericardium: There is no evidence of pericardial effusion. Mitral Valve: The mitral valve is degenerative in appearance. There  is mild thickening of the mitral valve leaflet(s). There is mild calcification of the mitral valve leaflet(s). Moderate mitral annular calcification. Trivial mitral valve regurgitation. Tricuspid Valve: The tricuspid valve is grossly normal. Tricuspid valve regurgitation is mild. Aortic Valve: The aortic valve is tricuspid. There is moderate calcification of the aortic valve. There is moderate aortic valve annular calcification. Aortic valve regurgitation is not visualized. Moderate to severe aortic stenosis is present. Aortic valve mean gradient measures 22.0 mmHg. Aortic valve peak gradient measures 32.0 mmHg. Aortic valve area, by VTI measures 0.98 cm. Pulmonic Valve: The pulmonic valve was grossly normal. Pulmonic valve regurgitation is trivial. Aorta: Aortic dilatation noted. There is borderline dilatation of the aortic root, measuring 40 mm. Venous: The inferior vena cava is dilated in size with less than 50% respiratory variability, suggesting right atrial pressure of 15 mmHg. IAS/Shunts: No atrial level shunt detected by color flow Doppler.  LEFT VENTRICLE PLAX 2D LVIDd:         5.80 cm      Diastology LVIDs:         4.80 cm      LV e' medial:    4.68 cm/s LV PW:         1.40 cm      LV E/e' medial:  28.4 LV IVS:        1.40 cm      LV e' lateral:   5.33 cm/s LVOT diam:     2.60 cm      LV E/e' lateral: 25.0 LV SV:         57 LV SV Index:   24  LVOT Area:     5.31 cm  LV Volumes (MOD) LV vol d, MOD A2C: 169.0 ml LV vol d, MOD A4C: 184.0 ml LV vol s, MOD A2C: 97.6 ml LV vol s, MOD A4C: 111.0 ml LV SV MOD A2C:     71.4 ml LV SV MOD A4C:     184.0 ml LV SV MOD BP:      72.1 ml RIGHT VENTRICLE RV S prime:     6.31 cm/s TAPSE (M-mode): 1.4 cm LEFT ATRIUM              Index        RIGHT ATRIUM           Index LA diam:        5.10 cm  2.16 cm/m   RA Area:     20.40 cm LA Vol (A2C):   105.0 ml 44.38 ml/m  RA Volume:   61.40 ml  25.95 ml/m LA Vol (A4C):   101.0 ml 42.69 ml/m LA Biplane Vol: 102.0 ml 43.12 ml/m  AORTIC VALVE AV Area (Vmax):    1.04 cm AV Area (Vmean):   0.96 cm AV Area (VTI):     0.98 cm AV Vmax:           283.00 cm/s AV Vmean:          219.000 cm/s AV VTI:            0.583 m AV Peak Grad:      32.0 mmHg AV Mean Grad:      22.0 mmHg LVOT Vmax:         55.40 cm/s LVOT Vmean:        39.400 cm/s LVOT VTI:          0.108 m LVOT/AV VTI ratio: 0.19  AORTA Ao Root diam: 4.00 cm MITRAL VALVE  TRICUSPID VALVE MV Area (PHT): 6.17 cm     TR Peak grad:   27.2 mmHg MV Decel Time: 123 msec     TR Vmax:        261.00 cm/s MR Peak grad: 92.5 mmHg MR Mean grad: 54.0 mmHg     SHUNTS MR Vmax:      481.00 cm/s   Systemic VTI:  0.11 m MR Vmean:     334.0 cm/s    Systemic Diam: 2.60 cm MV E velocity: 133.00 cm/s MV A velocity: 66.90 cm/s MV E/A ratio:  1.99 Nona Dell MD Electronically signed by Nona Dell MD Signature Date/Time: 11/09/2023/1:41:56 PM    Final    CT Head Wo Contrast Result Date: 11/09/2023 CLINICAL DATA:  Provided history: Mental status change, unknown cause. EXAM: CT HEAD WITHOUT CONTRAST TECHNIQUE: Contiguous axial images were obtained from the base of the skull through the vertex without intravenous contrast. RADIATION DOSE REDUCTION: This exam was performed according to the departmental dose-optimization program which includes automated exposure control, adjustment of the mA and/or kV according to patient size  and/or use of iterative reconstruction technique. COMPARISON:  Head CT 10/30/2022.  Brain MRI 02/04/2022. FINDINGS: Residual circulating contrast within the dural venous sinuses. 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 Mild patchy and ill-defined hypoattenuation within the cerebral white matter, nonspecific but compatible with chronic small vessel ischemic disease. There is no acute intracranial hemorrhage. No demarcated cortical infarct. No extra-axial fluid collection. No evidence of an intracranial mass. No midline shift. Vascular: No hyperdense arterial vessel. Atherosclerotic calcifications. Skull: No calvarial fracture or aggressive osseous lesion. Sinuses/Orbits: No mass or acute finding within the imaged orbits. Partially imaged mucous retention cyst within the left maxillary sinus measuring at least 1.8 cm. IMPRESSION: 1.  No evidence of an acute intracranial abnormality. 2. 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 3. Mild cerebral white matter chronic small vessel ischemic disease. 4. Partially imaged left maxillary sinus mucous retention cyst (measuring at least 1.8 cm). Electronically Signed   By: Jackey Loge D.O.   On: 11/09/2023 13:05   DG Chest 2 View Result Date: 11/08/2023 CLINICAL DATA:  Shortness of breath. EXAM: CHEST - 2 VIEW COMPARISON:  CT scan, same date. FINDINGS: The heart is enlarged but appears stable. Mild central vascular congestion without overt pulmonary edema. No definite infiltrates or effusions. The bony thorax is intact. IMPRESSION: Cardiomegaly and mild central vascular congestion. Electronically Signed   By: Rudie Meyer M.D.   On: 11/08/2023 18:55   CT Angio Chest PE W and/or Wo Contrast Result Date: 11/08/2023 CLINICAL DATA:  Shortness of breath at dialysis.  Hypotension. EXAM: CT ANGIOGRAPHY CHEST WITH CONTRAST TECHNIQUE:  Multidetector CT imaging of the chest was performed using the standard protocol during bolus administration of intravenous contrast. Multiplanar CT image reconstructions and MIPs were obtained to evaluate the vascular anatomy. RADIATION DOSE REDUCTION: This exam was performed according to the departmental dose-optimization program which includes automated exposure control, adjustment of the mA and/or kV according to patient size and/or use of iterative reconstruction technique. CONTRAST:  75mL OMNIPAQUE IOHEXOL 350 MG/ML SOLN COMPARISON:  CT scan 10/19/2022 FINDINGS: Cardiovascular: The heart is enlarged but appears stable. No pericardial effusion. Significant age advanced atherosclerotic calcification involving the aorta and coronary arteries. No aortic dissection or aneurysm. The pulmonary arterial tree is fairly well opacified. No filling defects to suggest pulmonary  embolism. There are extensive right-sided chest wall collaterals. 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 Mediastinum/Nodes: No mediastinal or hilar mass or lymphadenopathy. Scattered lymph nodes are stable. The esophagus is grossly normal. Lungs/Pleura: Mild dependent edema/atelectasis but no infiltrates or effusions. No worrisome pulmonary lesions or pulmonary nodules. The central tracheobronchial tree is unremarkable. Upper Abdomen: Significant age advanced vascular disease. No aneurysm or dissection. No upper abdominal mass or adenopathy. Musculoskeletal: Remote ununited sternal fracture. No acute bony findings. Review of the MIP images confirms the above findings. IMPRESSION: 1. No CT findings for pulmonary embolism. 2. Significant age advanced vascular disease. 3. No acute pulmonary findings or worrisome pulmonary lesions. 4. 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 5. Remote ununited sternal fracture. 6. Aortic atherosclerosis. Aortic Atherosclerosis (ICD10-I70.0). Electronically  Signed   By: Rudie Meyer M.D.   On: 11/08/2023 18:55   Medications:  heparin 1,600 Units/hr (11/10/23 0229)    aspirin  81 mg Oral Daily   atorvastatin  40 mg Oral Daily   Chlorhexidine Gluconate Cloth  6 each Topical Daily   cinacalcet  30 mg Oral Q breakfast   doxercalciferol  6 mcg Intravenous Q M,W,F-HD   insulin aspart  0-6 Units Subcutaneous TID WC   mupirocin ointment  1 Application Nasal BID   sevelamer carbonate  4,000 mg Oral TID WC     Dialysis Orders: Center: [facility] 4 hr 15 min 180NRe 450/700 105 kg 2.0 K/ 2.0 Ca UFP 4 AVF - Heparin 7000 units IV Initial bolus Heparin 2000  units IV mid run - Hectorol 7 mcg IV three times per week     Assessment/Plan:  NSTEMI-work up per cardiology/primary.   AMS- does not appear to be uremic. SCR 7.5 BUN 28 but labs drawn immediately following HD. Reviewed med list, do not see offending agents.  Hyperkalemia-K+ 7.5 this AM. On 1K bath for 2 hours. Recheck RFP at 1400 today.   HFrEF-attempt to optimize volume with dialysis. ECHO today with EF 30 to 35%. Cards following. Fluid restrictions and daily weights.   ESRD -  HD 11/10/2023 on Holiday Schedule. Next HD 11/13/2023.   Hyportension/volume  - Hypotensive, not on antihypertensive meds. Will order midodrine with HD tomorrow. Use albumin to support BP tomorrow if needed.   Anemia  - HGB 14.0-15.2. Follow HGB  Metabolic bone disease -  Last OP labs close to goal. Decrease VDRA D/T Slightly elevated calcium.  Decreased VDRA dose. On high dose Renvela binder.  Nutrition - Renal diet when eating.   DMT2-per primary  Dene Gentry. Iann Rodier NP-C 11/10/2023, 8:26 AM  [employer] [phone]

## 2023-11-10 NOTE — Progress Notes (Signed)
Rounding Note    Patient Name: [name] SASSONE Sr. Date of Encounter: 11/10/2023  [facility] Cardiologist: New  Subjective   Seen at HD. BPS improved. K>7.5 this AM. HS trop up to >8000.  The patient is lethargic upon question. He reports he has chest pain, but does not provide any specifics.   Inpatient Medications    Scheduled Meds:  aspirin  81 mg Oral Daily   atorvastatin  40 mg Oral Daily   Chlorhexidine Gluconate Cloth  6 each Topical Daily   cinacalcet  30 mg Oral Q breakfast   doxercalciferol  6 mcg Intravenous Q M,W,F-HD   insulin aspart  0-6 Units Subcutaneous TID WC   mupirocin ointment  1 Application Nasal BID   sevelamer carbonate  4,000 mg Oral TID WC   Continuous Infusions:  heparin 1,600 Units/hr (11/10/23 0229)   PRN Meds: acetaminophen **OR** acetaminophen, heparin, lidocaine (PF), lidocaine-prilocaine, ondansetron **OR** ondansetron (ZOFRAN) IV, mouth rinse, pentafluoroprop-tetrafluoroeth, sevelamer carbonate   Vital Signs    Vitals:   11/10/23 0758 11/10/23 0813 11/10/23 0830 11/10/23 0851  BP: (!) 86/36 134/71 (!) 135/102 115/72  Pulse: 87 90 (!) 112 99  Resp:  (!) 21 15 (!) 25  Temp:      TempSrc:      SpO2: (!) 88% 98% 97% 95%  Weight:      Height:       No intake or output data in the 24 hours ending 11/10/23 0900    11/10/2023    7:44 AM 11/10/2023    7:27 AM 11/10/2023    5:10 AM  Last 3 Weights  Weight (lbs) 245 lb 9.5 oz 245 lb 9.5 oz 249 lb 1.9 oz  Weight (kg) 111.4 kg 111.4 kg 113 kg      Telemetry    SR/ST HR 80-105 - Personally Reviewed  ECG    No new - Personally Reviewed  Physical Exam   GEN: No acute distress.   Neck: No JVD Cardiac: RRR, +murmur, no rubs, or gallops.  Respiratory: Clear to auscultation bilaterally. GI: Soft, nontender, non-distended  MS: No edema; No deformity. Neuro:  Nonfocal  Psych: Normal affect   Labs    High Sensitivity Troponin:   Recent Labs  Lab 10/25/23 1500  11/08/23 1658 11/08/23 1830 11/09/23 1111 11/09/23 1314  TROPONINIHS 296* 7,689* 7,744* 8,442* 8,849*     Chemistry Recent Labs  Lab 11/08/23 1658 11/09/23 1945 11/10/23 0359  NA 130*  --  129*  K 4.5  --  >7.5*  CL 91*  --  90*  CO2 26  --  21*  GLUCOSE 290*  --  262*  BUN 28*  --  43*  CREATININE 7.50* 10.49* 11.47*  CALCIUM 10.3  --  9.9  PROT 8.3*  --   --   ALBUMIN 3.3*  --  3.4*  AST 44*  --   --   ALT 34  --   --   ALKPHOS 127*  --   --   BILITOT 0.6  --   --   GFRNONAA 8* 5* 5*  ANIONGAP 13  --  18*    Lipids No results for input(s): "CHOL", "TRIG", "HDL", "LABVLDL", "LDLCALC", "CHOLHDL" in the last 168 hours.  Hematology Recent Labs  Lab 11/09/23 0328 11/09/23 1945 11/10/23 0359  WBC 7.0 8.1 8.1  RBC 4.80 5.21 5.26  HGB 14.0 15.1 15.2  HCT 44.2 47.9 48.3  MCV 92.1 91.9 91.8  MCH 29.2 29.0  28.9  MCHC 31.7 31.5 31.5  RDW 15.4 15.6* 15.6*  PLT 170 173 161   Thyroid No results for input(s): "TSH", "FREET4" in the last 168 hours.  BNPNo results for input(s): "BNP", "PROBNP" in the last 168 hours.  DDimer No results for input(s): "DDIMER" in the last 168 hours.   Radiology    ECHOCARDIOGRAM COMPLETE Result Date: 11/09/2023    ECHOCARDIOGRAM REPORT   Patient Name:   [name] BARONA Sr. Date of Exam: 11/09/2023 Medical Rec #:  [id number]         Height:       73.0 in Accession #:    [id number]        Weight:       250.0 lb Date of Birth:  May 19, 1961        BSA:          2.366 m Patient Age:    62 years          BP:           126/56 mmHg Patient Gender: M                 HR:           89 bpm. [facility] Procedure: 2D Echo, Cardiac Doppler and Color Doppler Indications:    Chest Pain R07.9  History:        Patient has prior history of Echocardiogram examinations, most                 recent 10/25/2023. Cardiomyopathy and CHF, Previous Myocardial                 Infarction and CAD; Risk Factors:Hypertension and Diabetes.  Sonographer:    Celesta Gentile  RCS Referring Phys: [phone] CHRISTOPHER RONALD BERGE IMPRESSIONS  1. Left ventricular ejection fraction, by estimation, is 30 to 35%. The left ventricle has moderately decreased function. The left ventricle demonstrates regional wall motion abnormalities (see scoring diagram/findings for description). There is moderate concentric left ventricular hypertrophy. Left ventricular diastolic parameters are consistent with Grade III diastolic dysfunction (restrictive).  2. Right ventricular systolic function is mildly reduced. The right ventricular size is normal. There is mildly elevated pulmonary artery systolic pressure. The estimated right ventricular systolic pressure is 42.2 mmHg.  3. Left atrial size was moderately dilated.  4. The mitral valve is degenerative. Trivial mitral valve regurgitation. Moderate mitral annular calcification.  5. The aortic valve is tricuspid. There is moderate calcification of the aortic valve. Aortic valve regurgitation is not visualized. Moderate to severe aortic valve stenosis - suspect upper end of scale, low flow/low gradient. Aortic valve area, by VTI measures 0.98 cm. Aortic valve mean gradient measures 22.0 mmHg. Dimentionless index 0.19.  6. Aortic dilatation noted. There is borderline dilatation of the aortic root, measuring 40 mm.  7. The inferior vena cava is dilated in size with <50% respiratory variability, suggesting right atrial pressure of 15 mmHg. Comparison(s): Prior images reviewed side by side. LVEF has decreased, 30-35% range with basal inferoposterior akinesis and restrictive diastolic filling. Aortic stenosis possibly severe (low flow/low gradient). Mildly elevated RVSP. FINDINGS  Left Ventricle: Left ventricular ejection fraction, by estimation, is 30 to 35%. The left ventricle has moderately decreased function. The left ventricle demonstrates regional wall motion abnormalities. The left ventricular internal cavity size was normal in size. There is moderate  concentric left ventricular hypertrophy. Left ventricular diastolic parameters are consistent with Grade III diastolic dysfunction (restrictive).  LV  Wall Scoring: The inferior wall, posterior wall, and basal anterolateral segment are akinetic. The entire anterior wall, entire septum, entire apex, and mid anterolateral segment are hypokinetic. Right Ventricle: The right ventricular size is normal. No increase in right ventricular wall thickness. Right ventricular systolic function is mildly reduced. There is mildly elevated pulmonary artery systolic pressure. The tricuspid regurgitant velocity  is 2.61 m/s, and with an assumed right atrial pressure of 15 mmHg, the estimated right ventricular systolic pressure is 42.2 mmHg. Left Atrium: Left atrial size was moderately dilated. Right Atrium: Right atrial size was normal in size. Pericardium: There is no evidence of pericardial effusion. Mitral Valve: The mitral valve is degenerative in appearance. There is mild thickening of the mitral valve leaflet(s). There is mild calcification of the mitral valve leaflet(s). Moderate mitral annular calcification. Trivial mitral valve regurgitation. Tricuspid Valve: The tricuspid valve is grossly normal. Tricuspid valve regurgitation is mild. Aortic Valve: The aortic valve is tricuspid. There is moderate calcification of the aortic valve. There is moderate aortic valve annular calcification. Aortic valve regurgitation is not visualized. Moderate to severe aortic stenosis is present. Aortic valve mean gradient measures 22.0 mmHg. Aortic valve peak gradient measures 32.0 mmHg. Aortic valve area, by VTI measures 0.98 cm. Pulmonic Valve: The pulmonic valve was grossly normal. Pulmonic valve regurgitation is trivial. Aorta: Aortic dilatation noted. There is borderline dilatation of the aortic root, measuring 40 mm. Venous: The inferior vena cava is dilated in size with less than 50% respiratory variability, suggesting right atrial  pressure of 15 mmHg. IAS/Shunts: No atrial level shunt detected by color flow Doppler.  LEFT VENTRICLE PLAX 2D LVIDd:         5.80 cm      Diastology LVIDs:         4.80 cm      LV e' medial:    4.68 cm/s LV PW:         1.40 cm      LV E/e' medial:  28.4 LV IVS:        1.40 cm      LV e' lateral:   5.33 cm/s LVOT diam:     2.60 cm      LV E/e' lateral: 25.0 LV SV:         57 LV SV Index:   24 LVOT Area:     5.31 cm  LV Volumes (MOD) LV vol d, MOD A2C: 169.0 ml LV vol d, MOD A4C: 184.0 ml LV vol s, MOD A2C: 97.6 ml LV vol s, MOD A4C: 111.0 ml LV SV MOD A2C:     71.4 ml LV SV MOD A4C:     184.0 ml LV SV MOD BP:      72.1 ml RIGHT VENTRICLE RV S prime:     6.31 cm/s TAPSE (M-mode): 1.4 cm LEFT ATRIUM              Index        RIGHT ATRIUM           Index LA diam:        5.10 cm  2.16 cm/m   RA Area:     20.40 cm LA Vol (A2C):   105.0 ml 44.38 ml/m  RA Volume:   61.40 ml  25.95 ml/m LA Vol (A4C):   101.0 ml 42.69 ml/m LA Biplane Vol: 102.0 ml 43.12 ml/m  AORTIC VALVE AV Area (Vmax):    1.04 cm AV Area (Vmean):   0.96 cm AV  Area (VTI):     0.98 cm AV Vmax:           283.00 cm/s AV Vmean:          219.000 cm/s AV VTI:            0.583 m AV Peak Grad:      32.0 mmHg AV Mean Grad:      22.0 mmHg LVOT Vmax:         55.40 cm/s LVOT Vmean:        39.400 cm/s LVOT VTI:          0.108 m LVOT/AV VTI ratio: 0.19  AORTA Ao Root diam: 4.00 cm MITRAL VALVE                TRICUSPID VALVE MV Area (PHT): 6.17 cm     TR Peak grad:   27.2 mmHg MV Decel Time: 123 msec     TR Vmax:        261.00 cm/s MR Peak grad: 92.5 mmHg MR Mean grad: 54.0 mmHg     SHUNTS MR Vmax:      481.00 cm/s   Systemic VTI:  0.11 m MR Vmean:     334.0 cm/s    Systemic Diam: 2.60 cm MV E velocity: 133.00 cm/s MV A velocity: 66.90 cm/s MV E/A ratio:  1.99 Nona Dell MD Electronically signed by Nona Dell MD Signature Date/Time: 11/09/2023/1:41:56 PM    Final    CT Head Wo Contrast Result Date: 11/09/2023 CLINICAL DATA:  Provided history:  Mental status change, unknown cause. EXAM: CT HEAD WITHOUT CONTRAST TECHNIQUE: Contiguous axial images were obtained from the base of the skull through the vertex without intravenous contrast. RADIATION DOSE REDUCTION: This exam was performed according to the departmental dose-optimization program which includes automated exposure control, adjustment of the mA and/or kV according to patient size and/or use of iterative reconstruction technique. COMPARISON:  Head CT 10/30/2022.  Brain MRI 02/04/2022. FINDINGS: Residual circulating contrast within the dural venous sinuses. 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 Mild patchy and ill-defined hypoattenuation within the cerebral white matter, nonspecific but compatible with chronic small vessel ischemic disease. There is no acute intracranial hemorrhage. No demarcated cortical infarct. No extra-axial fluid collection. No evidence of an intracranial mass. No midline shift. Vascular: No hyperdense arterial vessel. Atherosclerotic calcifications. Skull: No calvarial fracture or aggressive osseous lesion. Sinuses/Orbits: No mass or acute finding within the imaged orbits. Partially imaged mucous retention cyst within the left maxillary sinus measuring at least 1.8 cm. IMPRESSION: 1.  No evidence of an acute intracranial abnormality. 2. 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 3. Mild cerebral white matter chronic small vessel ischemic disease. 4. Partially imaged left maxillary sinus mucous retention cyst (measuring at least 1.8 cm). Electronically Signed   By: Jackey Loge D.O.   On: 11/09/2023 13:05   DG Chest 2 View Result Date: 11/08/2023 CLINICAL DATA:  Shortness of breath. EXAM: CHEST - 2 VIEW COMPARISON:  CT scan, same date. FINDINGS: The heart is enlarged but appears stable. Mild central vascular congestion without overt  pulmonary edema. No definite infiltrates or effusions. The bony thorax is intact. IMPRESSION: Cardiomegaly and mild central vascular congestion. Electronically Signed   By: Rudie Meyer M.D.   On: 11/08/2023 18:55   CT Angio Chest PE W and/or Wo Contrast Result Date: 11/08/2023 CLINICAL DATA:  Shortness of breath at dialysis.  Hypotension. EXAM: CT ANGIOGRAPHY CHEST WITH CONTRAST TECHNIQUE: Multidetector CT imaging of the chest was performed using the standard protocol during bolus administration of intravenous contrast. Multiplanar CT image reconstructions and MIPs were obtained to evaluate the vascular anatomy. RADIATION DOSE REDUCTION: This exam was performed according to the departmental dose-optimization program which includes automated exposure control, adjustment of the mA and/or kV according to patient size and/or use of iterative reconstruction technique. CONTRAST:  75mL OMNIPAQUE IOHEXOL 350 MG/ML SOLN COMPARISON:  CT scan 10/19/2022 FINDINGS: Cardiovascular: The heart is enlarged but appears stable. No pericardial effusion. Significant age advanced atherosclerotic calcification involving the aorta and coronary arteries. No aortic dissection or aneurysm. The pulmonary arterial tree is fairly well opacified. No filling defects to suggest pulmonary embolism. There are extensive right-sided chest wall collaterals. 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 Mediastinum/Nodes: No mediastinal or hilar mass or lymphadenopathy. Scattered lymph nodes are stable. The esophagus is grossly normal. Lungs/Pleura: Mild dependent edema/atelectasis but no infiltrates or effusions. No worrisome pulmonary lesions or pulmonary nodules. The central tracheobronchial tree is unremarkable. Upper Abdomen: Significant age advanced vascular disease. No aneurysm or dissection. No upper abdominal mass or adenopathy. Musculoskeletal: Remote ununited sternal fracture. No acute bony findings. Review of the MIP images  confirms the above findings. IMPRESSION: 1. No CT findings for pulmonary embolism. 2. Significant age advanced vascular disease. 3. No acute pulmonary findings or worrisome pulmonary lesions. 4. 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 5. Remote ununited sternal fracture. 6. Aortic atherosclerosis. Aortic Atherosclerosis (ICD10-I70.0). Electronically Signed   By: Rudie Meyer M.D.   On: 11/08/2023 18:55    Cardiac Studies   Echo 11/09/23 1. Left ventricular ejection fraction, by estimation, is 30 to 35%. The  left ventricle has moderately decreased function. The left ventricle  demonstrates regional wall motion abnormalities (see scoring  diagram/findings for description). There is  moderate concentric left ventricular hypertrophy. Left ventricular  diastolic parameters are consistent with Grade III diastolic dysfunction  (restrictive).   2. Right ventricular systolic function is mildly reduced. The right  ventricular size is normal. There is mildly elevated pulmonary artery  systolic pressure. The estimated right ventricular systolic pressure is  42.2 mmHg.   3. Left atrial size was moderately dilated.   4. The mitral valve is degenerative. Trivial mitral valve regurgitation.  Moderate mitral annular calcification.   5. The aortic valve is tricuspid. There is moderate calcification of the  aortic valve. Aortic valve regurgitation is not visualized. Moderate to  severe aortic valve stenosis - suspect upper end of scale, low flow/low  gradient. Aortic valve area, by VTI  measures 0.98 cm. Aortic valve mean gradient measures 22.0 mmHg.  Dimentionless index 0.19.   6. Aortic dilatation noted. There is borderline dilatation of the aortic  root, measuring 40 mm.   7. The inferior vena cava is dilated in size with <50% respiratory  variability, suggesting right atrial pressure of 15 mmHg.   Comparison(s): Prior images reviewed side by side.  LVEF has decreased,  30-35% range with basal inferoposterior akinesis and restrictive diastolic  filling. Aortic stenosis possibly severe (low flow/low gradient). Mildly  elevated RVSP.   Echo 10/25/23 1. Left ventricular ejection fraction, by estimation, is 45 to 50%. The  left ventricle has mildly decreased function. The left ventricle  demonstrates global hypokinesis. There is mild concentric left ventricular  hypertrophy. Left ventricular diastolic  parameters are consistent with Grade II diastolic dysfunction  (  pseudonormalization).   2. RV is not well visualized, function looks at least mildly reduced. The  right ventricular size is not well visualized. There is moderately  elevated pulmonary artery systolic pressure. The estimated right  ventricular systolic pressure is 52.2 mmHg.   3. The mitral valve is degenerative. Mild mitral valve regurgitation.  Mild mitral stenosis. The mean mitral valve gradient is 5.0 mmHg. Severe  mitral annular calcification.   4. Tricuspid valve regurgitation is moderate.   5. The aortic valve is calcified. Aortic valve regurgitation is not  visualized. Mild to moderate aortic valve stenosis.   6. The inferior vena cava is dilated in size with <50% respiratory  variability, suggesting right atrial pressure of 15 mmHg.   Conclusion(s)/Recommendation(s): Increased pulmonary HTN compared to prior  study can be VO.   Echo 2023  1. Left ventricular ejection fraction, by estimation, is 45 to 50%. The  left ventricle has mildly decreased function. The left ventricle  demonstrates global hypokinesis. There is moderate left ventricular  hypertrophy. Left ventricular diastolic function   could not be evaluated.   2. Right ventricular systolic function is mildly reduced. The right  ventricular size is normal.   3. The mitral valve is degenerative. Mild mitral valve regurgitation. No  evidence of mitral stenosis. Moderate to severe mitral annular   calcification.   4. The aortic valve is tricuspid. There is moderate calcification of the  aortic valve. There is moderate thickening of the aortic valve. Aortic  valve regurgitation is not visualized. Mild aortic valve stenosis. Aortic  valve area, by VTI measures 2.26  cm. Aortic valve mean gradient measures 8.0 mmHg. Aortic valve Vmax  measures 1.92 m/s.   Conclusion(s)/Recommendation(s): No evidence of valvular vegetations on  this transthoracic echocardiogram. Consider a transesophageal  echocardiogram to exclude infective endocarditis if clinically indicated.    Patient Profile     62 y.o. male with a history of nonischemic cardiomyopathy, chronic heart failure with midrange ejection fraction (45-50% December 2024), nonobstructive coronary artery disease, hypertension, diabetes, and end-stage renal disease, who is being seen today for the evaluation of non-STEMI, hypertension, and presyncope.  Assessment & Plan    NSTEMI/CAD - patient has a h/o nonobstructive CAD on cath in 2019 at an outside hospital - stress test in 2022 at [facility] was unremarkable - he presented from HD on 12/22 with hypotension, presyncope, chest pain and dyspnea, - HS trop elevated to [phone]. Will continue to trend - continue IV heparin - repeat echo showed further reduced LVEF 30-35% with mod to severe AS. Prior LVEF 45-50% - started on ASA and statin - may need repeat heart cath given NSTEMI, reduced EF and worsening valve disease.  AMS - head CT non-acute, has chronic small vessel disease and chronic lacunar infarcts  Chronic heart failure - Echo 2023 LVEF 45-50%, mildly reduced RV, mild AS - Echo 10/2023 LVEF 45-50%, G2DD, mildly reduced RV, mild to mod AS - Echo 11/09/23 LVEF 30-35%, G3DD, mildly reduced RV, trivial MR, moderate to severe AS, borderline dilation of the aortic root - volume managed by HD  AS - echo showed moderate calcification of the aortic valve, moderate to severe AS,  suspect low flow-low gradients, VTI 0.98cm2, mean gradient , DI 0.19 - may eventually need heart cath as above  ESRD on HD Hyperkalemia - K >7.5 - HD per nephrology  HTN - initially hypotensive>>Resolved - can start BB  For questions or updates, please contact [facility] Please consult [email or web address] for contact  info under        Signed, Joyann Spidle [employer], PA-C  11/10/2023, 9:00 AM

## 2023-11-10 NOTE — Plan of Care (Signed)
  Problem: Clinical Measurements: Goal: Diagnostic test results will improve Outcome: Progressing Goal: Respiratory complications will improve Outcome: Progressing Goal: Cardiovascular complication will be avoided Outcome: Progressing   

## 2023-11-10 NOTE — Telephone Encounter (Signed)
Patient Product/process development scientist completed.    The patient is insured through [employer]. Patient has [employer], may use a copay card, and/or apply for patient assistance if available.    Ran test claim for Eliquis 5 mg and the current 30 day co-pay is $0.00.   This test claim was processed through [facility]- copay amounts may vary at other pharmacies due to pharmacy/plan contracts, or as the patient moves through the different stages of their insurance plan.     Roland Earl, CPHT Pharmacy Technician III Certified Patient Advocate [facility] Patient Advocate Team Direct Number: [phone]  Fax: [phone]

## 2023-11-10 NOTE — Plan of Care (Signed)
Problem: Education: Goal: Knowledge of General Education information will improve Description: Including pain rating scale, medication(s)/side effects and non-pharmacologic comfort measures Outcome: Progressing   Problem: Health Behavior/Discharge Planning: Goal: Ability to manage health-related needs will improve Outcome: Progressing   Problem: Clinical Measurements: Goal: Ability to maintain clinical measurements within normal limits will improve Outcome: Progressing Goal: Will remain free from infection Outcome: Progressing Goal: Diagnostic test results will improve Outcome: Progressing Goal: Respiratory complications will improve Outcome: Progressing Goal: Cardiovascular complication will be avoided Outcome: Progressing   Problem: Activity: Goal: Risk for activity intolerance will decrease Outcome: Progressing   Problem: Nutrition: Goal: Adequate nutrition will be maintained Outcome: Progressing   Problem: Coping: Goal: Level of anxiety will decrease Outcome: Progressing   Problem: Elimination: Goal: Will not experience complications related to bowel motility Outcome: Progressing Goal: Will not experience complications related to urinary retention Outcome: Progressing   Problem: Pain Management: Goal: General experience of comfort will improve Outcome: Progressing   Problem: Safety: Goal: Ability to remain free from injury will improve Outcome: Progressing   Problem: Skin Integrity: Goal: Risk for impaired skin integrity will decrease Outcome: Progressing   Problem: Education: Goal: Ability to describe self-care measures that may prevent or decrease complications (Diabetes Survival Skills Education) will improve Outcome: Progressing Goal: Individualized Educational Video(s) Outcome: Progressing   Problem: Coping: Goal: Ability to adjust to condition or change in health will improve Outcome: Progressing   Problem: Fluid Volume: Goal: Ability to  maintain a balanced intake and output will improve Outcome: Progressing   Problem: Health Behavior/Discharge Planning: Goal: Ability to identify and utilize available resources and services will improve Outcome: Progressing Goal: Ability to manage health-related needs will improve Outcome: Progressing   Problem: Metabolic: Goal: Ability to maintain appropriate glucose levels will improve Outcome: Progressing   Problem: Nutritional: Goal: Maintenance of adequate nutrition will improve Outcome: Progressing Goal: Progress toward achieving an optimal weight will improve Outcome: Progressing   Problem: Skin Integrity: Goal: Risk for impaired skin integrity will decrease Outcome: Progressing   Problem: Tissue Perfusion: Goal: Adequacy of tissue perfusion will improve Outcome: Progressing   Problem: Education: Goal: Knowledge of General Education information will improve Description: Including pain rating scale, medication(s)/side effects and non-pharmacologic comfort measures Outcome: Progressing   Problem: Health Behavior/Discharge Planning: Goal: Ability to manage health-related needs will improve Outcome: Progressing   Problem: Clinical Measurements: Goal: Ability to maintain clinical measurements within normal limits will improve Outcome: Progressing Goal: Will remain free from infection Outcome: Progressing Goal: Diagnostic test results will improve Outcome: Progressing Goal: Respiratory complications will improve Outcome: Progressing Goal: Cardiovascular complication will be avoided Outcome: Progressing   Problem: Activity: Goal: Risk for activity intolerance will decrease Outcome: Progressing   Problem: Nutrition: Goal: Adequate nutrition will be maintained Outcome: Progressing   Problem: Coping: Goal: Level of anxiety will decrease Outcome: Progressing   Problem: Elimination: Goal: Will not experience complications related to bowel motility Outcome:  Progressing Goal: Will not experience complications related to urinary retention Outcome: Progressing   Problem: Pain Management: Goal: General experience of comfort will improve Outcome: Progressing   Problem: Safety: Goal: Ability to remain free from injury will improve Outcome: Progressing   Problem: Skin Integrity: Goal: Risk for impaired skin integrity will decrease Outcome: Progressing   Problem: Education: Goal: Ability to describe self-care measures that may prevent or decrease complications (Diabetes Survival Skills Education) will improve Outcome: Progressing Goal: Individualized Educational Video(s) Outcome: Progressing   Problem: Coping: Goal: Ability to adjust to condition or change  in health will improve Outcome: Progressing   Problem: Fluid Volume: Goal: Ability to maintain a balanced intake and output will improve Outcome: Progressing   Problem: Health Behavior/Discharge Planning: Goal: Ability to identify and utilize available resources and services will improve Outcome: Progressing Goal: Ability to manage health-related needs will improve Outcome: Progressing   Problem: Metabolic: Goal: Ability to maintain appropriate glucose levels will improve Outcome: Progressing   Problem: Nutritional: Goal: Maintenance of adequate nutrition will improve Outcome: Progressing Goal: Progress toward achieving an optimal weight will improve Outcome: Progressing   Problem: Skin Integrity: Goal: Risk for impaired skin integrity will decrease Outcome: Progressing   Problem: Tissue Perfusion: Goal: Adequacy of tissue perfusion will improve Outcome: Progressing   Problem: Education: Goal: Knowledge of General Education information will improve Description: Including pain rating scale, medication(s)/side effects and non-pharmacologic comfort measures Outcome: Progressing   Problem: Health Behavior/Discharge Planning: Goal: Ability to manage health-related needs  will improve Outcome: Progressing   Problem: Clinical Measurements: Goal: Ability to maintain clinical measurements within normal limits will improve Outcome: Progressing Goal: Will remain free from infection Outcome: Progressing Goal: Diagnostic test results will improve Outcome: Progressing Goal: Respiratory complications will improve Outcome: Progressing Goal: Cardiovascular complication will be avoided Outcome: Progressing   Problem: Activity: Goal: Risk for activity intolerance will decrease Outcome: Progressing   Problem: Nutrition: Goal: Adequate nutrition will be maintained Outcome: Progressing   Problem: Coping: Goal: Level of anxiety will decrease Outcome: Progressing   Problem: Elimination: Goal: Will not experience complications related to bowel motility Outcome: Progressing Goal: Will not experience complications related to urinary retention Outcome: Progressing   Problem: Pain Management: Goal: General experience of comfort will improve Outcome: Progressing   Problem: Safety: Goal: Ability to remain free from injury will improve Outcome: Progressing   Problem: Skin Integrity: Goal: Risk for impaired skin integrity will decrease Outcome: Progressing   Problem: Education: Goal: Ability to describe self-care measures that may prevent or decrease complications (Diabetes Survival Skills Education) will improve Outcome: Progressing Goal: Individualized Educational Video(s) Outcome: Progressing   Problem: Coping: Goal: Ability to adjust to condition or change in health will improve Outcome: Progressing   Problem: Fluid Volume: Goal: Ability to maintain a balanced intake and output will improve Outcome: Progressing   Problem: Health Behavior/Discharge Planning: Goal: Ability to identify and utilize available resources and services will improve Outcome: Progressing Goal: Ability to manage health-related needs will improve Outcome: Progressing    Problem: Metabolic: Goal: Ability to maintain appropriate glucose levels will improve Outcome: Progressing   Problem: Nutritional: Goal: Maintenance of adequate nutrition will improve Outcome: Progressing Goal: Progress toward achieving an optimal weight will improve Outcome: Progressing   Problem: Skin Integrity: Goal: Risk for impaired skin integrity will decrease Outcome: Progressing   Problem: Tissue Perfusion: Goal: Adequacy of tissue perfusion will improve Outcome: Progressing   Problem: Education: Goal: Knowledge of General Education information will improve Description: Including pain rating scale, medication(s)/side effects and non-pharmacologic comfort measures Outcome: Progressing   Problem: Health Behavior/Discharge Planning: Goal: Ability to manage health-related needs will improve Outcome: Progressing   Problem: Clinical Measurements: Goal: Ability to maintain clinical measurements within normal limits will improve Outcome: Progressing Goal: Will remain free from infection Outcome: Progressing Goal: Diagnostic test results will improve Outcome: Progressing Goal: Respiratory complications will improve Outcome: Progressing Goal: Cardiovascular complication will be avoided Outcome: Progressing   Problem: Activity: Goal: Risk for activity intolerance will decrease Outcome: Progressing   Problem: Nutrition: Goal: Adequate nutrition will be maintained Outcome: Progressing  Problem: Coping: Goal: Level of anxiety will decrease Outcome: Progressing   Problem: Elimination: Goal: Will not experience complications related to bowel motility Outcome: Progressing Goal: Will not experience complications related to urinary retention Outcome: Progressing   Problem: Pain Management: Goal: General experience of comfort will improve Outcome: Progressing   Problem: Safety: Goal: Ability to remain free from injury will improve Outcome: Progressing    Problem: Skin Integrity: Goal: Risk for impaired skin integrity will decrease Outcome: Progressing   Problem: Education: Goal: Ability to describe self-care measures that may prevent or decrease complications (Diabetes Survival Skills Education) will improve Outcome: Progressing Goal: Individualized Educational Video(s) Outcome: Progressing   Problem: Coping: Goal: Ability to adjust to condition or change in health will improve Outcome: Progressing   Problem: Fluid Volume: Goal: Ability to maintain a balanced intake and output will improve Outcome: Progressing   Problem: Health Behavior/Discharge Planning: Goal: Ability to identify and utilize available resources and services will improve Outcome: Progressing Goal: Ability to manage health-related needs will improve Outcome: Progressing   Problem: Metabolic: Goal: Ability to maintain appropriate glucose levels will improve Outcome: Progressing   Problem: Nutritional: Goal: Maintenance of adequate nutrition will improve Outcome: Progressing Goal: Progress toward achieving an optimal weight will improve Outcome: Progressing   Problem: Skin Integrity: Goal: Risk for impaired skin integrity will decrease Outcome: Progressing   Problem: Tissue Perfusion: Goal: Adequacy of tissue perfusion will improve Outcome: Progressing

## 2023-11-10 NOTE — Consult Note (Signed)
PHARMACY - ANTICOAGULATION CONSULT NOTE  Pharmacy Consult for Heparin infusion Indication: chest pain/ACS + AFib  Allergies  Allergen Reactions   Sulfa Antibiotics Diarrhea and Nausea And Vomiting    Patient Measurements: Height: 6\' 1"  (185.4 cm) Weight: 106.4 kg (234 lb 9.1 oz) (Taken via bed.) IBW/kg (Calculated) : 79.9 Heparin Dosing Weight: 103.9 kg  Vital Signs: Temp: 98.1 F (36.7 C) (12/24 1157) Temp Source: Oral (12/24 0510) BP: 140/92 (12/24 1157) Pulse Rate: 110 (12/24 1157)  Labs: Recent Labs    11/08/23 1658 11/08/23 1658 11/08/23 1830 11/09/23 0328 11/09/23 1111 11/09/23 1314 11/09/23 1945 11/10/23 0359 11/10/23 0808 11/10/23 0907 11/10/23 1126  HGB 14.4  --   --  14.0  --   --  15.1 15.2  --   --   --   HCT 44.6  --   --  44.2  --   --  47.9 48.3  --   --   --   PLT 176  --   --  170  --   --  173 161  --   --   --   APTT  --   --  28  --   --   --   --   --   --   --   --   LABPROT  --   --  15.2  --   --   --   --   --   --   --   --   INR  --   --  1.2  --   --   --   --   --   --   --   --   HEPARINUNFRC  --    < >  --  0.10* 0.39  --  0.45  --  >1.10*  --  0.73*  CREATININE 7.50*  --   --   --   --   --  10.49* 11.47*  --   --   --   TROPONINIHS 1,610*  --  7,744*  --  9,604* 8,849*  --   --   --  8,942*  --    < > = values in this interval not displayed.    Estimated Creatinine Clearance: 8.7 mL/min (A) (by C-G formula based on SCr of 11.47 mg/dL (H)).   Medical History: Past Medical History:  Diagnosis Date   ESRD on hemodialysis (HCC)    a. HD MWF > 10 years.   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 01/2022 Echo: EF 45-50%, glob HK, mod LVH, mildly reduced RV fxn, mild MR, mod-sev MV Ca2+. Mild AS; b. 10/2023 Echo: EF 45-50%, glob HK, GrII DD, mildly reduced RV fxn, RVSP 52.79mmHg. Mild MR/MS, mod TR.   Hypertension    Nonischemic cardiomyopathy (HCC)    a. 2019 Cath [facility]): Nonobstructive disease; b. 12/2019  Echo: EF 50%; c. 12/2019 MV (Tx w/u [facility]): Ant infarct w/ peri-infarct ischemia. EF 35%; c. 05/2021 MV (Tx w/u [facility]): Cor Ca2+, EF 53%, motion artifact, equiv study, likely low risk.   Nonobstructive coronary artery disease    a.  2019 Cath ([facility]): Left main normal, LAD 30 proximal, RCA 30 distal, EF 25%.   Obesity    Type 2 diabetes mellitus (HCC)    a. 10/2023 - admission w/ HHS.     Assessment: 50 yoM ESRD admitted with CP and AFL. Pharmacy to dose IV heparin.  Heparin  level this am was elevated at >1.1, but was drawn in HD shortly after heparin bolus given to begin dialysis. Redraw several hours later is still elevated but trending down to 0.73. Will reduce heparin slightly and redraw with am labs, pt previously therapeutic on 1600 units/h.   Goal of Therapy:  Heparin level 0.3-0.7 units/ml Monitor platelets by anticoagulation protocol: Yes   Plan:  Reduce heparin to 1550 units/h Daily heparin level and CBC  [name] Highland, PharmD, [place], [facility] Clinical Pharmacist [phone] Please check AMION for all [facility] Pharmacy numbers 11/10/2023

## 2023-11-10 NOTE — Progress Notes (Signed)
Progress Note   Patient: [name] CREW Sr. ZOX:[id number] DOB: [birth year] DOA: 11/08/2023     2 DOS: the patient was seen and examined on 11/10/2023   Brief hospital course: [name] MEININGER Sr. is a 62 y.o. male with medical history significant of ESRD on HD, (MWF) heart failure, T2DM, sp right BKA and obesity who presented after experiencing a near syncope during hemodialysis. Recent hospitalization 12/06 to 12/10 for uncontrolled hyperglycemia. Patient was having HD on 12/22 when he was noted to have chest pain and hypotension, along with a near syncope episode. Apparently treatment was stopped and EMS was called. He wss found hypotensive and was transported to the ED. His blood pressure was initially low in the ED 75/59, but improved to 106/72. He was found to have very elevated high sensitive troponin and cardiology was consulted for evaluation.  He was diagnosed with NSTEMI and plan to admit patient to [facility] under cardiology. Pt was transferred to [facility] for further evaluation.   Assessment and Plan: * NSTEMI (non-ST elevated myocardial infarction) [facility]) -Cardiology following, appreciate recs -Pt remains CP free -Troponin peaked at 8,942 and downtrended to 7,282 -Continue IV heparin --Continue aspirin 81 mg daily and atorvastatin 40 mg daily  -Will need R and L heart cath this admission once mental status changes improves  ESRD on dialysis [facility]) -Nephrology following, appreciate recs -Had repeat HD today with 5 L of UF removed -Soft BP after HD -Slight worsening of mental status after HD -K+ improved to 5.9  Sepsis (HCC) rule out -Patient become more somnolent after HD today.  -Spiked a fever of 103.1 -Sepsis work up initiated due to tachycardia, fever, tachypnea and AMS -CXR does not show any signs of infection -CBC with no leukocytosis -Broad spectrum abx initiated with IV Vancomycin and cefepime -Follow up blood culture -Trend CBC, fever curve  Acute on chronic systolic  CHF (congestive heart failure) (HCC) -TTE shows EF 30-35% with moderate LVH, G3DD with basal inferior posterior AK and possible severe low-flow low gradient AS  -S/p HD today for fluid removal -Continue telemetry monitoring.   HTN (hypertension) -Slight drop in BP after HD but remains stable  Acute metabolic encephalopathy -Multifactorial encephalopathy in the setting of possible sepsis and hypotension during HD prior to admission, -Head CT with no acute bleed or new CVA -Continue supportive medical care. -Continue neuro checks q 4 hrs  -Keep normotensive -Will consider MRI brain if no improvement in mental status  DM2 (diabetes mellitus, type 2) (HCC) -Patient with poor oral intake due to altered cognition.  -Continue SSI  History of CVA  -Continue blood pressure monitoring and neuro checks.  -Avoid hypotension.  -Continue ASA and statin as above  Protein-calorie malnutrition, mild (HCC) -Consulted nutrition for recommendations.   Obesity (BMI 30-39.9) -Calculated BMI 32.9 consistent with obesity class 1       Subjective: Patient evaluated after returning from HD. He is somnolent with eyes closed. He does not open eyes but moving all extremities. Minimally responsive. Able to take deep breath when asked but does not follow command to open eyes or squeeze hand.   Physical Exam: Vitals:   11/10/23 1645 11/10/23 1738 11/10/23 1750 11/10/23 1821  BP: (!) 94/32  93/62   Pulse: 94 93 99   Resp: 20  20   Temp: (!) 102.1 F (38.9 C)   (!) 102.4 F (39.1 C)  TempSrc: Axillary   Axillary  SpO2: 93% 90% 97%   Weight:  Height:       General: Ill-appearing obese man laying in bed. No acute distress. HEENT: [place]/AT. Anicteric sclera. CV: RRR. Soft systolic murmur. No LE edema Pulmonary: On 2LNC. Lungs CTAB. Decreased breath sounds throughout. Abdominal: Soft, nontender, nondistended. Normal bowel sounds. Extremities: R BKA. Normal ROM.  Skin: Warm and dry. No obvious rash  or lesions. Neuro: Somnolent. Moves all extremities. Normal sensation to light touch. Does not follow active commands Psych: Normal mood and affect  Data Reviewed:  Pertinent Labs:    Latest Ref Rng & Units 11/10/2023    3:03 PM 11/10/2023    3:59 AM 11/09/2023    7:45 PM  CBC  WBC 4.0 - 10.5 K/uL 9.6  8.1  8.1   Hemoglobin 13.0 - 17.0 g/dL 16.1  09.6  04.5   Hematocrit 39.0 - 52.0 % 47.4  48.3  47.9   Platelets 150 - 400 K/uL 179  161  173        Latest Ref Rng & Units 11/10/2023    3:03 PM 11/10/2023    3:59 AM 11/09/2023    7:45 PM  CMP  Glucose 70 - 99 mg/dL 409  811    BUN 8 - 23 mg/dL 32  43    Creatinine 9.14 - 1.24 mg/dL 7.82  95.62  13.08   Sodium 135 - 145 mmol/L 130  129    Potassium 3.5 - 5.1 mmol/L 5.9  >7.5    Chloride 98 - 111 mmol/L 89  90    CO2 22 - 32 mmol/L 19  21    Calcium 8.9 - 10.3 mg/dL 9.7  9.9    Total Protein 6.5 - 8.1 g/dL 9.3     Total Bilirubin <1.2 mg/dL 1.0     Alkaline Phos 38 - 126 U/L 135     AST 15 - 41 U/L 117     ALT 0 - 44 U/L 67      DG Chest 2 View Result Date: 11/10/2023 IMPRESSION: Cardiac enlargement with central vascular congestion. No edema or consolidation.  CT Head Wo Contrast Result Date: 11/09/2023  IMPRESSION: 1.  No evidence of an acute intracranial abnormality. 2. 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 3. Mild cerebral white matter chronic small vessel ischemic disease. 4. Partially imaged left maxillary sinus mucous retention cyst (measuring at least 1.8 cm). Electronically Signed   By: Jackey Loge D.O.   On: 11/09/2023 13:05   CT Angio Chest PE W and/or Wo Contrast Result Date: 11/08/2023  IMPRESSION: 1. No CT findings for pulmonary embolism. 2. Significant age advanced vascular disease. 3. No acute pulmonary findings or worrisome pulmonary lesions. 4. 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 5. Remote ununited sternal fracture. 6.  Aortic atherosclerosis. Aortic Atherosclerosis (ICD10-I70.0). Electronically Signed   By: Rudie Meyer M.D.   On: 11/08/2023 18:55    Family Communication: No family at bedside  Disposition: Status is: Inpatient Remains inpatient appropriate because: AMS, NSTEMI, Sepsis  Planned Discharge Destination:  Pending    Time spent: 50 minutes  Author: Steffanie Rainwater, MD 11/10/2023 7:00 PM  For on call review [email or web address].

## 2023-11-10 NOTE — Progress Notes (Signed)
Pharmacy Antibiotic Note  [name] FRANCHINA Sr. is a 62 y.o. male with possible sepsis and pharmacy consulted to dose cefepime and vancomycin. He is noted with ESRD with last HD 12/24)  Plan: -Cefepime 2gm IV MWF -vanc 2gm x1 followed by 1000mg  on MWF -Follow HD schedule closely during holidays -Will follow cultures and clinical progress   Height: 6\' 1"  (185.4 cm) Weight: 106.4 kg (234 lb 9.1 oz) (Taken via bed.) IBW/kg (Calculated) : 79.9  Temp (24hrs), Avg:100.3 F (37.9 C), Min:98.1 F (36.7 C), Max:104 F (40 C)  Recent Labs  Lab 11/08/23 1658 11/09/23 0328 11/09/23 1945 11/10/23 0359  WBC 6.3 7.0 8.1 8.1  CREATININE 7.50*  --  10.49* 11.47*    Estimated Creatinine Clearance: 8.7 mL/min (A) (by C-G formula based on SCr of 11.47 mg/dL (H)).    Allergies  Allergen Reactions   Sulfa Antibiotics Diarrhea and Nausea And Vomiting    Antimicrobials this admission: 12/24 cefepime 12/24 vanc  Dose adjustments this admission:   Microbiology results: 12/23 MRSA -positive  Thank you for allowing pharmacy to be a part of this patient's care.  Harland German, PharmD Clinical Pharmacist **Pharmacist phone directory can now be found on [email or web address] ([email or web address]).  Listed under [facility].

## 2023-11-10 NOTE — Significant Event (Addendum)
TRH night coverage note:  Pt with: 1) fever 103.x 2) worsened mental status 3) BPs low (70s-80s systolic). 4) watery diarrhea that smells strongly of C.Diff per RN.  C.Diff testing ordered Need to add C.Diff ABx coverage if positive. Already started on cefepime, flagyl, vanc by day team Gave 500cc bolus, BP still low, giving 2nd bolus, discussing with PCCM wether to give full sepsis bolus or wether hypotension is more cardiogenic in nature (see below). DDx includes sepsis associated hypotension / septic shock vs cardiogenic shock given NSTEMI, lack of tachycardia. Giving PCCM a call as well to weigh in on the situation. Am a bit concerned that CXR already showing pulm vasc congestion at 1600 and he sounds congested on X ray

## 2023-11-10 NOTE — Hospital Course (Addendum)
[name] Lance Sr. is a 62 y.o. male with medical history significant of ESRD on HD, (MWF) heart failure, T2DM, sp right BKA and obesity who presented after experiencing a near syncope during hemodialysis. Recent hospitalization 12/06 to 12/10 for uncontrolled hyperglycemia. Patient was having HD on 12/22 when he was noted to have chest pain and hypotension, along with a near syncope episode. Apparently treatment was stopped and EMS was called. He wss found hypotensive and was transported to the ED. His blood pressure was initially low in the ED 75/59, but improved to 106/72. He was found to have very elevated high sensitive troponin and cardiology was consulted for evaluation.  He was diagnosed with NSTEMI and plan to admit patient to [facility] under cardiology. Pt was transferred to [facility] for further evaluation.

## 2023-11-11 ENCOUNTER — Inpatient Hospital Stay (HOSPITAL_COMMUNITY): Payer: Medicare Other

## 2023-11-11 DIAGNOSIS — I214 Non-ST elevation (NSTEMI) myocardial infarction: Secondary | ICD-10-CM | POA: Diagnosis not present

## 2023-11-11 LAB — BLOOD GAS, ARTERIAL
Acid-base deficit: 0.6 mmol/L (ref 0.0–2.0)
Bicarbonate: 23.4 mmol/L (ref 20.0–28.0)
O2 Saturation: 90.2 %
Patient temperature: 37
pCO2 arterial: 36 mm[Hg] (ref 32–48)
pH, Arterial: 7.42 (ref 7.35–7.45)
pO2, Arterial: 59 mm[Hg] — ABNORMAL LOW (ref 83–108)

## 2023-11-11 LAB — HEPATITIS B SURFACE ANTIBODY, QUANTITATIVE: Hep B S AB Quant (Post): 228 m[IU]/mL

## 2023-11-11 LAB — RENAL FUNCTION PANEL
Albumin: 3 g/dL — ABNORMAL LOW (ref 3.5–5.0)
Albumin: 3.3 g/dL — ABNORMAL LOW (ref 3.5–5.0)
Anion gap: 18 — ABNORMAL HIGH (ref 5–15)
Anion gap: 16 — ABNORMAL HIGH (ref 5–15)
BUN: 43 mg/dL — ABNORMAL HIGH (ref 8–23)
BUN: 56 mg/dL — ABNORMAL HIGH (ref 8–23)
CO2: 20 mmol/L — ABNORMAL LOW (ref 22–32)
CO2: 21 mmol/L — ABNORMAL LOW (ref 22–32)
Calcium: 9.3 mg/dL (ref 8.9–10.3)
Calcium: 10.1 mg/dL (ref 8.9–10.3)
Chloride: 90 mmol/L — ABNORMAL LOW (ref 98–111)
Chloride: 90 mmol/L — ABNORMAL LOW (ref 98–111)
Creatinine, Ser: 10.09 mg/dL — ABNORMAL HIGH (ref 0.61–1.24)
Creatinine, Ser: 11.53 mg/dL — ABNORMAL HIGH (ref 0.61–1.24)
GFR, Estimated: 5 mL/min — ABNORMAL LOW (ref >60)
GFR, Estimated: 5 mL/min — ABNORMAL LOW (ref >60)
Glucose, Bld: 259 mg/dL — ABNORMAL HIGH (ref 70–99)
Glucose, Bld: 275 mg/dL — ABNORMAL HIGH (ref 70–99)
Phosphorus: 6.5 mg/dL — ABNORMAL HIGH (ref 2.5–4.6)
Phosphorus: 7.2 mg/dL — ABNORMAL HIGH (ref 2.5–4.6)
Potassium: 6.1 mmol/L — ABNORMAL HIGH (ref 3.5–5.1)
Potassium: 6.3 mmol/L (ref 3.5–5.1)
Sodium: 128 mmol/L — ABNORMAL LOW (ref 135–145)
Sodium: 127 mmol/L — ABNORMAL LOW (ref 135–145)

## 2023-11-11 LAB — CBC
HCT: 46.3 % (ref 39.0–52.0)
Hemoglobin: 14.7 g/dL (ref 13.0–17.0)
MCH: 29 pg (ref 26.0–34.0)
MCHC: 31.7 g/dL (ref 30.0–36.0)
MCV: 91.3 fL (ref 80.0–100.0)
Platelets: 149 10*3/uL — ABNORMAL LOW (ref 150–400)
RBC: 5.07 MIL/uL (ref 4.22–5.81)
RDW: 15.7 % — ABNORMAL HIGH (ref 11.5–15.5)
WBC: 10.7 10*3/uL — ABNORMAL HIGH (ref 4.0–10.5)
nRBC: 0 % (ref 0.0–0.2)

## 2023-11-11 LAB — TROPONIN I (HIGH SENSITIVITY): Troponin I (High Sensitivity): 7672 ng/L (ref <18)

## 2023-11-11 LAB — GLUCOSE, CAPILLARY
Glucose-Capillary: 250 mg/dL — ABNORMAL HIGH (ref 70–99)
Glucose-Capillary: 275 mg/dL — ABNORMAL HIGH (ref 70–99)
Glucose-Capillary: 270 mg/dL — ABNORMAL HIGH (ref 70–99)

## 2023-11-11 LAB — LACTIC ACID, PLASMA
Lactic Acid, Venous: 2.1 mmol/L (ref 0.5–1.9)
Lactic Acid, Venous: 2.1 mmol/L (ref 0.5–1.9)

## 2023-11-11 LAB — HEPARIN LEVEL (UNFRACTIONATED)
Heparin Unfractionated: >1.1 [IU]/mL — ABNORMAL HIGH (ref 0.30–0.70)
Heparin Unfractionated: >1.1 [IU]/mL — ABNORMAL HIGH (ref 0.30–0.70)
Heparin Unfractionated: 0.33 [IU]/mL (ref 0.30–0.70)

## 2023-11-11 MED ORDER — PIPERACILLIN-TAZOBACTAM IN DEX 2-0.25 GM/50ML IV SOLN
2.2500 g | Freq: Three times a day (TID) | INTRAVENOUS | Status: DC
  Administered 2023-11-12 (×2): 2.25 g via INTRAVENOUS
  Filled 2023-11-11 (×4): qty 50

## 2023-11-11 MED ORDER — CHLORHEXIDINE GLUCONATE CLOTH 2 % EX PADS: 6.0000 | MEDICATED_PAD | Freq: Every day | CUTANEOUS | Status: DC

## 2023-11-11 MED ORDER — SODIUM ZIRCONIUM CYCLOSILICATE 10 G PO PACK
10.0000 g | PACK | Freq: Every day | ORAL | Status: DC
  Administered 2023-11-12 – 2023-11-14 (×3): 10 g via ORAL
  Filled 2023-11-11 (×3): qty 1

## 2023-11-11 MED ORDER — MIDODRINE HCL 5 MG PO TABS
10.0000 mg | ORAL_TABLET | Freq: Three times a day (TID) | ORAL | Status: DC
Start: 2023-11-11 — End: 2023-12-15
  Administered 2023-11-11 – 2023-12-15 (×82): 10 mg via ORAL
  Filled 2023-11-11 (×84): qty 2

## 2023-11-11 MED ORDER — SODIUM ZIRCONIUM CYCLOSILICATE 10 G PO PACK
10.0000 g | PACK | Freq: Once | ORAL | Status: AC
  Administered 2023-11-11: 10 g via ORAL
  Filled 2023-11-11: qty 1

## 2023-11-11 MED ORDER — HEPARIN (PORCINE) 25000 UT/250ML-% IV SOLN
1600.0000 [IU]/h | INTRAVENOUS | Status: DC
  Administered 2023-11-11 – 2023-11-16 (×7): 1350 [IU]/h via INTRAVENOUS
  Administered 2023-11-17: 1600 [IU]/h via INTRAVENOUS
  Filled 2023-11-11 (×8): qty 250

## 2023-11-11 MED ORDER — SODIUM ZIRCONIUM CYCLOSILICATE 10 G PO PACK
10.0000 g | PACK | Freq: Once | ORAL | Status: AC
  Administered 2023-11-11: 10 g via ORAL
  Filled 2023-11-11 (×2): qty 1

## 2023-11-11 MED ORDER — METRONIDAZOLE 500 MG/100ML IV SOLN
500.0000 mg | Freq: Two times a day (BID) | INTRAVENOUS | Status: DC
  Administered 2023-11-11: 500 mg via INTRAVENOUS
  Filled 2023-11-11 (×2): qty 100

## 2023-11-11 NOTE — Progress Notes (Signed)
Pharmacy Antibiotic Note  [name] Gest. is a 62 y.o. male with ESRD on HD admitted on 11/08/2023 with sepsis.  Pharmacy has been consulted for zosyn dosing.  Plan: Zosyn 2.25g IV q8h Follow up renal function, cultures as available, clinical progress, length of tx  Height: 6\' 1"  (185.4 cm) Weight: 106.4 kg (234 lb 9.1 oz) (Taken via bed.) IBW/kg (Calculated) : 79.9  Temp (24hrs), Avg:100.8 F (38.2 C), Min:98.1 F (36.7 C), Max:103.1 F (39.5 C)  Recent Labs  Lab 11/09/23 0328 11/09/23 1945 11/10/23 0359 11/10/23 1503 11/11/23 0001 11/11/23 0407 11/11/23 1647  WBC 7.0 8.1 8.1 9.6  --  10.7*  --   CREATININE  --  10.49* 11.47* 8.80*  --  10.09* 11.53*  LATICACIDVEN  --   --   --   --  2.1* 2.1*  --     Estimated Creatinine Clearance: 8.6 mL/min (A) (by C-G formula based on SCr of 11.53 mg/dL (H)).    Allergies  Allergen Reactions   Sulfa Antibiotics Diarrhea and Nausea And Vomiting    Antimicrobials this admission: Vancomycin 12/24 >>  Cefepime 12/24 >> 12/25 Metronidazole 12/25 >> 12/25 Zosyn 12/25 >>  Dose adjustments this admission:  Microbiology results: 12/25 sputum cx: sent  12/24 Bcx: ngtd  12/23 MRSA PCR positive   Thank you for allowing pharmacy to be a part of this patient's care.  Loralee Pacas, PharmD, BCPS 11/11/2023 7:13 PM  Please check AMION for all [facility] phone numbers After 10:00 PM, call Main Pharmacy [phone]

## 2023-11-11 NOTE — Progress Notes (Signed)
PROGRESS NOTE    [name]  HKV:[id number] DOB: 05-17-1961 DOA: 11/08/2023 PCP: Pcp, No    Brief Narrative:  62 year old with extensive medical issues, ESRD on hemodialysis, type 2 diabetes, right BKA apparently had a near syncopal episode during hemodialysis so sent to the emergency room.  He was also noticing chest pain and hypotension.  In the emergency room patient was hypotensive with initial blood pressure 75/59 but improved to 106/72.  He was found to have elevated troponins and cardiology was consulted.  Started on heparin infusion. Remains in the hospital, lethargic with altered mentation.  Subjective: Patient seen and examined.  Hard to arouse.  Responds to simple questions.  On room air.  Overnight given 1 L fluid bolus for low blood pressure.  Unable to talk or have any meaningful conversation.  Blood pressures are adequate now. Assessment & Plan:   Non-STEMI: Currently chest pain-free.  Troponins markedly elevated.  Remains on aspirin, atorvastatin and IV heparin.  Followed by cardiology.  May need cardiac cath once he stabilizes.  Acute metabolic encephalopathy: Possible sepsis, hypotension.  CT head was essentially normal.  With persistent encephalopathy, will check MRI of the brain and blood gas today. Continue supportive care.  ESRD on hemodialysis, hyperkalemia: Difficult to tolerate hemodialysis.  5 L removed 12/24.  Hypotensive after that.  Next [employer].  Chronic systolic heart failure: Still fluid overloaded on clinical exam.  For dialysis tomorrow.  Sepsis, present on admission: Presented with tachycardia, fever, tachypnea and altered mental status.  Chest x-ray was normal.  CBC without leukocytosis.  Blood cultures negative so far.  On cefepime and vancomycin.  Continue.  Does not have any meningeal signs, however if persistent fever will need lumbar puncture to rule out meningitis.  Will add Flagyl today.  Type 2 diabetes, blood sugars stable.  On  low-dose insulin due to inadequate oral intake.   DVT prophylaxis: SCDs Start: 11/09/23 1558   Code Status: Full code Family Communication: None at the bedside Disposition Plan: Status is: Inpatient Remains inpatient appropriate because: Severe systemic illness, sepsis     Consultants:  Cardiology Nephrology  Procedures:  Routine hemodialysis  Antimicrobials:  Vancomycin, cefepime and Flagyl 12/33---     Objective: Vitals:   11/11/23 0816 11/11/23 0916 11/11/23 1017 11/11/23 1045  BP: 106/68 133/78 (!) 89/51 127/82  Pulse: 81     Resp:      Temp:      TempSrc:      SpO2: 94%     Weight:      Height:        Intake/Output Summary (Last 24 hours) at 11/11/2023 1146 Last data filed at 11/10/2023 1810 Gross per 24 hour  Intake 0 ml  Output 5000 ml  Net -5000 ml   Filed Weights   11/10/23 0727 11/10/23 0744 11/10/23 1157  Weight: 111.4 kg 111.4 kg 106.4 kg    Examination:  General exam: Sick looking.  Lethargic.  On room air. Hard to arouse.  Follows simple commands.  Moves all extremities on commands. Respiratory system: Clear to auscultation.  Poor inspiratory efforts. Cardiovascular system: S1 & S2 heard, RRR.  Gastrointestinal system: Soft.  Nontender. Right BKA stump clean and dry.    Data Reviewed: I have personally reviewed following labs and imaging studies  CBC: Recent Labs  Lab 11/08/23 1658 11/09/23 0328 11/09/23 1945 11/10/23 0359 11/10/23 1503 11/11/23 0407  WBC 6.3 7.0 8.1 8.1 9.6 10.7*  NEUTROABS 3.7  --   --   --  6.6  --   HGB 14.4 14.0 15.1 15.2 15.3 14.7  HCT 44.6 44.2 47.9 48.3 47.4 46.3  MCV 91.6 92.1 91.9 91.8 89.4 91.3  PLT 176 170 173 161 179 149*   Basic Metabolic Panel: Recent Labs  Lab 11/08/23 1658 11/09/23 1945 11/10/23 0359 11/10/23 1503 11/11/23 0407  NA 130*  --  129* 130* 128*  K 4.5  --  >7.5* 5.9* 6.1*  CL 91*  --  90* 89* 90*  CO2 26  --  21* 19* 20*  GLUCOSE 290*  --  262* 196* 259*  BUN 28*   --  43* 32* 43*  CREATININE 7.50* 10.49* 11.47* 8.80* 10.09*  CALCIUM 10.3  --  9.9 9.7 9.3  PHOS  --   --  6.3*  --  6.5*   GFR: Estimated Creatinine Clearance: 9.8 mL/min (A) (by C-G formula based on SCr of 10.09 mg/dL (H)). Liver Function Tests: Recent Labs  Lab 11/08/23 1658 11/10/23 0359 11/10/23 1503 11/11/23 0407  AST 44*  --  117*  --   ALT 34  --  67*  --   ALKPHOS 127*  --  135*  --   BILITOT 0.6  --  1.0  --   PROT 8.3*  --  9.3*  --   ALBUMIN 3.3* 3.4* 3.5 3.0*   No results for input(s): "LIPASE", "AMYLASE" in the last 168 hours. No results for input(s): "AMMONIA" in the last 168 hours. Coagulation Profile: Recent Labs  Lab 11/08/23 1830  INR 1.2   Cardiac Enzymes: No results for input(s): "CKTOTAL", "CKMB", "CKMBINDEX", "TROPONINI" in the last 168 hours. BNP (last 3 results) No results for input(s): "PROBNP" in the last 8760 hours. HbA1C: No results for input(s): "HGBA1C" in the last 72 hours. CBG: Recent Labs  Lab 11/10/23 1247 11/10/23 1636 11/10/23 2125 11/11/23 0803 11/11/23 1126  GLUCAP 187* 205* 241* 250* 275*   Lipid Profile: No results for input(s): "CHOL", "HDL", "LDLCALC", "TRIG", "CHOLHDL", "LDLDIRECT" in the last 72 hours. Thyroid Function Tests: No results for input(s): "TSH", "T4TOTAL", "FREET4", "T3FREE", "THYROIDAB" in the last 72 hours. Anemia Panel: No results for input(s): "VITAMINB12", "FOLATE", "FERRITIN", "TIBC", "IRON", "RETICCTPCT" in the last 72 hours. Sepsis Labs: Recent Labs  Lab 11/11/23 0001 11/11/23 0407  LATICACIDVEN 2.1* 2.1*    Recent Results (from the past 240 hours)  MRSA Next Gen by PCR, Nasal     Status: Abnormal   Collection Time: 11/09/23  4:15 PM   Specimen: Nasal Mucosa; Nasal Swab  Result Value Ref Range Status   MRSA by PCR Next Gen DETECTED (A) NOT DETECTED Final    Comment: RESULT CALLED TO, READ BACK BY AND VERIFIED WITH: C HALL,RN@2358  11/09/23 MK (NOTE) The GeneXpert MRSA Assay (FDA  approved for NASAL specimens only), is one component of a comprehensive MRSA colonization surveillance program. It is not intended to diagnose MRSA infection nor to guide or monitor treatment for MRSA infections. Test performance is not FDA approved in patients less than 41 years old. Performed at [facility], [address], [place], [place] [address]   Culture, blood (Routine X 2) w Reflex to ID Panel     Status: None (Preliminary result)   Collection Time: 11/10/23  3:03 PM   Specimen: BLOOD RIGHT ARM  Result Value Ref Range Status   Specimen Description BLOOD RIGHT ARM  Final   Special Requests   Final    BOTTLES DRAWN AEROBIC AND ANAEROBIC Blood Culture results may  not be optimal due to an inadequate volume of blood received in culture bottles   Culture   Final    NO GROWTH < 24 HOURS Performed at [facility], [address], [place], [place] [address]    Report Status PENDING  Incomplete  Culture, blood (Routine X 2) w Reflex to ID Panel     Status: None (Preliminary result)   Collection Time: 11/10/23  3:08 PM   Specimen: BLOOD RIGHT ARM  Result Value Ref Range Status   Specimen Description BLOOD RIGHT ARM  Final   Special Requests   Final    BOTTLES DRAWN AEROBIC ONLY Blood Culture results may not be optimal due to an inadequate volume of blood received in culture bottles   Culture   Final    NO GROWTH < 24 HOURS Performed at [facility], [address] [place], [place] [address]    Report Status PENDING  Incomplete         Radiology Studies: DG Chest Port 1 View Result Date: 11/10/2023 CLINICAL DATA:  Dyspnea.  Possible sepsis. EXAM: PORTABLE CHEST 1 VIEW COMPARISON:  11/08/2023 FINDINGS: Cardiac enlargement. Mild central vascular congestion. Shallow inspiration. No airspace disease or consolidation in the lungs. No pleural effusions. No pneumothorax. Calcification of the aorta. Degenerative changes in the spine and shoulders. IMPRESSION:  Cardiac enlargement with central vascular congestion. No edema or consolidation. Electronically Signed   By: Burman Nieves M.D.   On: 11/10/2023 19:29   ECHOCARDIOGRAM COMPLETE Result Date: 11/09/2023    ECHOCARDIOGRAM REPORT   Patient Name:   JHETT NEIRA Sr. Date of Exam: 11/09/2023 Medical Rec #:  [id number]         Height:       73.0 in Accession #:    [id number]        Weight:       250.0 lb Date of Birth:  1961/08/15        BSA:          2.366 m Patient Age:    62 years          BP:           126/56 mmHg Patient Gender: M                 HR:           89 bpm. [facility] Procedure: 2D Echo, Cardiac Doppler and Color Doppler Indications:    Chest Pain R07.9  History:        Patient has prior history of Echocardiogram examinations, most                 recent 10/25/2023. Cardiomyopathy and CHF, Previous Myocardial                 Infarction and CAD; Risk Factors:Hypertension and Diabetes.  Sonographer:    Celesta Gentile RCS Referring Phys: [phone] CHRISTOPHER RONALD BERGE IMPRESSIONS  1. Left ventricular ejection fraction, by estimation, is 30 to 35%. The left ventricle has moderately decreased function. The left ventricle demonstrates regional wall motion abnormalities (see scoring diagram/findings for description). There is moderate concentric left ventricular hypertrophy. Left ventricular diastolic parameters are consistent with Grade III diastolic dysfunction (restrictive).  2. Right ventricular systolic function is mildly reduced. The right ventricular size is normal. There is mildly elevated pulmonary artery systolic pressure. The estimated right ventricular systolic pressure is 42.2 mmHg.  3. Left atrial size was moderately dilated.  4. The mitral valve is degenerative. Trivial mitral valve regurgitation. Moderate mitral annular calcification.  5. The aortic valve is tricuspid. There is moderate calcification of the aortic valve. Aortic valve regurgitation is not visualized. Moderate to  severe aortic valve stenosis - suspect upper end of scale, low flow/low gradient. Aortic valve area, by VTI measures 0.98 cm. Aortic valve mean gradient measures 22.0 mmHg. Dimentionless index 0.19.  6. Aortic dilatation noted. There is borderline dilatation of the aortic root, measuring 40 mm.  7. The inferior vena cava is dilated in size with <50% respiratory variability, suggesting right atrial pressure of 15 mmHg. Comparison(s): Prior images reviewed side by side. LVEF has decreased, 30-35% range with basal inferoposterior akinesis and restrictive diastolic filling. Aortic stenosis possibly severe (low flow/low gradient). Mildly elevated RVSP. FINDINGS  Left Ventricle: Left ventricular ejection fraction, by estimation, is 30 to 35%. The left ventricle has moderately decreased function. The left ventricle demonstrates regional wall motion abnormalities. The left ventricular internal cavity size was normal in size. There is moderate concentric left ventricular hypertrophy. Left ventricular diastolic parameters are consistent with Grade III diastolic dysfunction (restrictive).  LV Wall Scoring: The inferior wall, posterior wall, and basal anterolateral segment are akinetic. The entire anterior wall, entire septum, entire apex, and mid anterolateral segment are hypokinetic. Right Ventricle: The right ventricular size is normal. No increase in right ventricular wall thickness. Right ventricular systolic function is mildly reduced. There is mildly elevated pulmonary artery systolic pressure. The tricuspid regurgitant velocity  is 2.61 m/s, and with an assumed right atrial pressure of 15 mmHg, the estimated right ventricular systolic pressure is 42.2 mmHg. Left Atrium: Left atrial size was moderately dilated. Right Atrium: Right atrial size was normal in size. Pericardium: There is no evidence of pericardial effusion. Mitral Valve: The mitral valve is degenerative in appearance. There is mild thickening of the mitral  valve leaflet(s). There is mild calcification of the mitral valve leaflet(s). Moderate mitral annular calcification. Trivial mitral valve regurgitation. Tricuspid Valve: The tricuspid valve is grossly normal. Tricuspid valve regurgitation is mild. Aortic Valve: The aortic valve is tricuspid. There is moderate calcification of the aortic valve. There is moderate aortic valve annular calcification. Aortic valve regurgitation is not visualized. Moderate to severe aortic stenosis is present. Aortic valve mean gradient measures 22.0 mmHg. Aortic valve peak gradient measures 32.0 mmHg. Aortic valve area, by VTI measures 0.98 cm. Pulmonic Valve: The pulmonic valve was grossly normal. Pulmonic valve regurgitation is trivial. Aorta: Aortic dilatation noted. There is borderline dilatation of the aortic root, measuring 40 mm. Venous: The inferior vena cava is dilated in size with less than 50% respiratory variability, suggesting right atrial pressure of 15 mmHg. IAS/Shunts: No atrial level shunt detected by color flow Doppler.  LEFT VENTRICLE PLAX 2D LVIDd:         5.80 cm      Diastology LVIDs:         4.80 cm      LV e' medial:    4.68 cm/s LV PW:         1.40 cm      LV E/e' medial:  28.4 LV IVS:        1.40 cm      LV e' lateral:   5.33 cm/s LVOT diam:     2.60 cm      LV E/e' lateral: 25.0 LV SV:         57 LV SV Index:   24 LVOT Area:  5.31 cm  LV Volumes (MOD) LV vol d, MOD A2C: 169.0 ml LV vol d, MOD A4C: 184.0 ml LV vol s, MOD A2C: 97.6 ml LV vol s, MOD A4C: 111.0 ml LV SV MOD A2C:     71.4 ml LV SV MOD A4C:     184.0 ml LV SV MOD BP:      72.1 ml RIGHT VENTRICLE RV S prime:     6.31 cm/s TAPSE (M-mode): 1.4 cm LEFT ATRIUM              Index        RIGHT ATRIUM           Index LA diam:        5.10 cm  2.16 cm/m   RA Area:     20.40 cm LA Vol (A2C):   105.0 ml 44.38 ml/m  RA Volume:   61.40 ml  25.95 ml/m LA Vol (A4C):   101.0 ml 42.69 ml/m LA Biplane Vol: 102.0 ml 43.12 ml/m  AORTIC VALVE AV Area (Vmax):     1.04 cm AV Area (Vmean):   0.96 cm AV Area (VTI):     0.98 cm AV Vmax:           283.00 cm/s AV Vmean:          219.000 cm/s AV VTI:            0.583 m AV Peak Grad:      32.0 mmHg AV Mean Grad:      22.0 mmHg LVOT Vmax:         55.40 cm/s LVOT Vmean:        39.400 cm/s LVOT VTI:          0.108 m LVOT/AV VTI ratio: 0.19  AORTA Ao Root diam: 4.00 cm MITRAL VALVE                TRICUSPID VALVE MV Area (PHT): 6.17 cm     TR Peak grad:   27.2 mmHg MV Decel Time: 123 msec     TR Vmax:        261.00 cm/s MR Peak grad: 92.5 mmHg MR Mean grad: 54.0 mmHg     SHUNTS MR Vmax:      481.00 cm/s   Systemic VTI:  0.11 m MR Vmean:     334.0 cm/s    Systemic Diam: 2.60 cm MV E velocity: 133.00 cm/s MV A velocity: 66.90 cm/s MV E/A ratio:  1.99 Nona Dell MD Electronically signed by Nona Dell MD Signature Date/Time: 11/09/2023/1:41:56 PM    Final    CT Head Wo Contrast Result Date: 11/09/2023 CLINICAL DATA:  Provided history: Mental status change, unknown cause. EXAM: CT HEAD WITHOUT CONTRAST TECHNIQUE: Contiguous axial images were obtained from the base of the skull through the vertex without intravenous contrast. RADIATION DOSE REDUCTION: This exam was performed according to the departmental dose-optimization program which includes automated exposure control, adjustment of the mA and/or kV according to patient size and/or use of iterative reconstruction technique. COMPARISON:  Head CT 10/30/2022.  Brain MRI 02/04/2022. FINDINGS: Residual circulating contrast within the dural venous sinuses. 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 Mild patchy and ill-defined hypoattenuation within the cerebral white matter, nonspecific but compatible with chronic small vessel ischemic disease. There is no acute intracranial hemorrhage. No demarcated cortical infarct. No extra-axial fluid collection. No evidence of  an  intracranial mass. No midline shift. Vascular: No hyperdense arterial vessel. Atherosclerotic calcifications. Skull: No calvarial fracture or aggressive osseous lesion. Sinuses/Orbits: No mass or acute finding within the imaged orbits. Partially imaged mucous retention cyst within the left maxillary sinus measuring at least 1.8 cm. IMPRESSION: 1.  No evidence of an acute intracranial abnormality. 2. 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 3. Mild cerebral white matter chronic small vessel ischemic disease. 4. Partially imaged left maxillary sinus mucous retention cyst (measuring at least 1.8 cm). Electronically Signed   By: Jackey Loge D.O.   On: 11/09/2023 13:05        Scheduled Meds:  aspirin  81 mg Oral Daily   atorvastatin  40 mg Oral Daily   Chlorhexidine Gluconate Cloth  6 each Topical Daily   cinacalcet  30 mg Oral Q breakfast   doxercalciferol  6 mcg Intravenous Q M,W,F-HD   insulin aspart  0-6 Units Subcutaneous TID WC   midodrine  10 mg Oral TID WC   mupirocin ointment  1 Application Nasal BID   sevelamer carbonate  4,000 mg Oral TID WC   sodium zirconium cyclosilicate  10 g Oral Once   [START ON 11/12/2023] sodium zirconium cyclosilicate  10 g Oral Daily   Continuous Infusions:  ceFEPime (MAXIPIME) IV Stopped (11/10/23 1650)   heparin     metronidazole     vancomycin       LOS: 3 days    Time spent: 35 minutes    Dorcas Carrow, MD [facility]

## 2023-11-11 NOTE — Plan of Care (Signed)
Hyperkalemia despite medical measures.   HD tonight 1K bath for 2 hours then transition 2K bath.  Orders in and I have spoken with on call HD RN.   Spoke with primary team.  Given AMS would transition off of cefepime.  Team is adjusting antibiotics.  We discussed that I would advocate for heart cath soon  Estanislado Emms, MD 6:13 PM 11/11/2023

## 2023-11-11 NOTE — Progress Notes (Signed)
[facility] Progress Note   Subjective: Issues overnight with hypotension and fever. Further decline of EF. Rec'd 1 liter of IVF . Net UF with HD 12/ 24/2024 5 liters.   He is minimally responsive K+ 6.1. BP remains soft. Concerned he may not tolerate HD.   Objective Vitals:   11/11/23 0120 11/11/23 0140 11/11/23 0200 11/11/23 0756  BP: 103/62 (!) 89/42 104/67 108/63  Pulse: 88 88 88   Resp:    18  Temp:   99.9 F (37.7 C) 98.7 F (37.1 C)  TempSrc:   Oral Oral  SpO2: 98% 97% 99% 95%  Weight:      Height:       General: Chronically ill appearing male in no acute distress. Head: Normocephalic, atraumatic, sclera non-icteric, mucus membranes are moist Neck: Supple. JVD 1/4 to mandible Lungs: Bilateral breath sounds decreased in bases R>L with few bibasilar crackles. Heart: SR with S1 S2 3/6 systolic M 5th ICS MCL. No R/G.  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 no edema LLE Neuro: Alert and oriented X 1 Psych:  Lethargic, slow to respond to verbal stimuli but more alert than 12/23 Dialysis Access: L AVF + T/B   Additional Objective Labs: Basic Metabolic Panel: Recent Labs  Lab 11/10/23 0359 11/10/23 1503 11/11/23 0407  NA 129* 130* 128*  K >7.5* 5.9* 6.1*  CL 90* 89* 90*  CO2 21* 19* 20*  GLUCOSE 262* 196* 259*  BUN 43* 32* 43*  CREATININE 11.47* 8.80* 10.09*  CALCIUM 9.9 9.7 9.3  PHOS 6.3*  --  6.5*   Liver Function Tests: Recent Labs  Lab 11/08/23 1658 11/10/23 0359 11/10/23 1503 11/11/23 0407  AST 44*  --  117*  --   ALT 34  --  67*  --   ALKPHOS 127*  --  135*  --   BILITOT 0.6  --  1.0  --   PROT 8.3*  --  9.3*  --   ALBUMIN 3.3* 3.4* 3.5 3.0*   No results for input(s): "LIPASE", "AMYLASE" in the last 168 hours. CBC: Recent Labs  Lab 11/08/23 1658 11/09/23 0328 11/09/23 1945 11/10/23 0359 11/10/23 1503 11/11/23 0407  WBC 6.3 7.0 8.1 8.1 9.6 10.7*  NEUTROABS 3.7  --   --   --  6.6  --   HGB 14.4 14.0  15.1 15.2 15.3 14.7  HCT 44.6 44.2 47.9 48.3 47.4 46.3  MCV 91.6 92.1 91.9 91.8 89.4 91.3  PLT 176 170 173 161 179 149*   Blood Culture    Component Value Date/Time   SDES BLOOD RIGHT ARM 11/10/2023 1508   SPECREQUEST  11/10/2023 1508    BOTTLES DRAWN AEROBIC ONLY Blood Culture results may not be optimal due to an inadequate volume of blood received in culture bottles   CULT  11/10/2023 1508    NO GROWTH < 24 HOURS Performed at [facility], [address] [place], [place] [address]    REPTSTATUS PENDING 11/10/2023 1508    Cardiac Enzymes: No results for input(s): "CKTOTAL", "CKMB", "CKMBINDEX", "TROPONINI" in the last 168 hours. CBG: Recent Labs  Lab 11/09/23 2209 11/10/23 1247 11/10/23 1636 11/10/23 2125 11/11/23 0803  GLUCAP 276* 187* 205* 241* 250*   Iron Studies: No results for input(s): "IRON", "TIBC", "TRANSFERRIN", "FERRITIN" in the last 72 hours. @lablastinr3 @ Studies/Results: DG Chest Port 1 View Result Date: 11/10/2023 CLINICAL DATA:  Dyspnea.  Possible sepsis. EXAM: PORTABLE CHEST 1 VIEW COMPARISON:  11/08/2023 FINDINGS:  Cardiac enlargement. Mild central vascular congestion. Shallow inspiration. No airspace disease or consolidation in the lungs. No pleural effusions. No pneumothorax. Calcification of the aorta. Degenerative changes in the spine and shoulders. IMPRESSION: Cardiac enlargement with central vascular congestion. No edema or consolidation. Electronically Signed   By: Burman Nieves M.D.   On: 11/10/2023 19:29   ECHOCARDIOGRAM COMPLETE Result Date: 11/09/2023    ECHOCARDIOGRAM REPORT   Patient Name:   JEFFRY RISS Sr. Date of Exam: 11/09/2023 Medical Rec #:  [id number]         Height:       73.0 in Accession #:    [id number]        Weight:       250.0 lb Date of Birth:  May 07, 1961        BSA:          2.366 m Patient Age:    62 years          BP:           126/56 mmHg Patient Gender: M                 HR:           89 bpm. [facility] Procedure: 2D Echo, Cardiac Doppler and Color Doppler Indications:    Chest Pain R07.9  History:        Patient has prior history of Echocardiogram examinations, most                 recent 10/25/2023. Cardiomyopathy and CHF, Previous Myocardial                 Infarction and CAD; Risk Factors:Hypertension and Diabetes.  Sonographer:    Celesta Gentile RCS Referring Phys: [phone] CHRISTOPHER RONALD BERGE IMPRESSIONS  1. Left ventricular ejection fraction, by estimation, is 30 to 35%. The left ventricle has moderately decreased function. The left ventricle demonstrates regional wall motion abnormalities (see scoring diagram/findings for description). There is moderate concentric left ventricular hypertrophy. Left ventricular diastolic parameters are consistent with Grade III diastolic dysfunction (restrictive).  2. Right ventricular systolic function is mildly reduced. The right ventricular size is normal. There is mildly elevated pulmonary artery systolic pressure. The estimated right ventricular systolic pressure is 42.2 mmHg.  3. Left atrial size was moderately dilated.  4. The mitral valve is degenerative. Trivial mitral valve regurgitation. Moderate mitral annular calcification.  5. The aortic valve is tricuspid. There is moderate calcification of the aortic valve. Aortic valve regurgitation is not visualized. Moderate to severe aortic valve stenosis - suspect upper end of scale, low flow/low gradient. Aortic valve area, by VTI measures 0.98 cm. Aortic valve mean gradient measures 22.0 mmHg. Dimentionless index 0.19.  6. Aortic dilatation noted. There is borderline dilatation of the aortic root, measuring 40 mm.  7. The inferior vena cava is dilated in size with <50% respiratory variability, suggesting right atrial pressure of 15 mmHg. Comparison(s): Prior images reviewed side by side. LVEF has decreased, 30-35% range with basal inferoposterior akinesis and restrictive diastolic filling. Aortic stenosis possibly  severe (low flow/low gradient). Mildly elevated RVSP. FINDINGS  Left Ventricle: Left ventricular ejection fraction, by estimation, is 30 to 35%. The left ventricle has moderately decreased function. The left ventricle demonstrates regional wall motion abnormalities. The left ventricular internal cavity size was normal in size. There is moderate concentric left ventricular hypertrophy. Left ventricular diastolic parameters are consistent with Grade III diastolic dysfunction (restrictive).  LV Wall  Scoring: The inferior wall, posterior wall, and basal anterolateral segment are akinetic. The entire anterior wall, entire septum, entire apex, and mid anterolateral segment are hypokinetic. Right Ventricle: The right ventricular size is normal. No increase in right ventricular wall thickness. Right ventricular systolic function is mildly reduced. There is mildly elevated pulmonary artery systolic pressure. The tricuspid regurgitant velocity  is 2.61 m/s, and with an assumed right atrial pressure of 15 mmHg, the estimated right ventricular systolic pressure is 42.2 mmHg. Left Atrium: Left atrial size was moderately dilated. Right Atrium: Right atrial size was normal in size. Pericardium: There is no evidence of pericardial effusion. Mitral Valve: The mitral valve is degenerative in appearance. There is mild thickening of the mitral valve leaflet(s). There is mild calcification of the mitral valve leaflet(s). Moderate mitral annular calcification. Trivial mitral valve regurgitation. Tricuspid Valve: The tricuspid valve is grossly normal. Tricuspid valve regurgitation is mild. Aortic Valve: The aortic valve is tricuspid. There is moderate calcification of the aortic valve. There is moderate aortic valve annular calcification. Aortic valve regurgitation is not visualized. Moderate to severe aortic stenosis is present. Aortic valve mean gradient measures 22.0 mmHg. Aortic valve peak gradient measures 32.0 mmHg. Aortic valve  area, by VTI measures 0.98 cm. Pulmonic Valve: The pulmonic valve was grossly normal. Pulmonic valve regurgitation is trivial. Aorta: Aortic dilatation noted. There is borderline dilatation of the aortic root, measuring 40 mm. Venous: The inferior vena cava is dilated in size with less than 50% respiratory variability, suggesting right atrial pressure of 15 mmHg. IAS/Shunts: No atrial level shunt detected by color flow Doppler.  LEFT VENTRICLE PLAX 2D LVIDd:         5.80 cm      Diastology LVIDs:         4.80 cm      LV e' medial:    4.68 cm/s LV PW:         1.40 cm      LV E/e' medial:  28.4 LV IVS:        1.40 cm      LV e' lateral:   5.33 cm/s LVOT diam:     2.60 cm      LV E/e' lateral: 25.0 LV SV:         57 LV SV Index:   24 LVOT Area:     5.31 cm  LV Volumes (MOD) LV vol d, MOD A2C: 169.0 ml LV vol d, MOD A4C: 184.0 ml LV vol s, MOD A2C: 97.6 ml LV vol s, MOD A4C: 111.0 ml LV SV MOD A2C:     71.4 ml LV SV MOD A4C:     184.0 ml LV SV MOD BP:      72.1 ml RIGHT VENTRICLE RV S prime:     6.31 cm/s TAPSE (M-mode): 1.4 cm LEFT ATRIUM              Index        RIGHT ATRIUM           Index LA diam:        5.10 cm  2.16 cm/m   RA Area:     20.40 cm LA Vol (A2C):   105.0 ml 44.38 ml/m  RA Volume:   61.40 ml  25.95 ml/m LA Vol (A4C):   101.0 ml 42.69 ml/m LA Biplane Vol: 102.0 ml 43.12 ml/m  AORTIC VALVE AV Area (Vmax):    1.04 cm AV Area (Vmean):   0.96 cm AV Area (  VTI):     0.98 cm AV Vmax:           283.00 cm/s AV Vmean:          219.000 cm/s AV VTI:            0.583 m AV Peak Grad:      32.0 mmHg AV Mean Grad:      22.0 mmHg LVOT Vmax:         55.40 cm/s LVOT Vmean:        39.400 cm/s LVOT VTI:          0.108 m LVOT/AV VTI ratio: 0.19  AORTA Ao Root diam: 4.00 cm MITRAL VALVE                TRICUSPID VALVE MV Area (PHT): 6.17 cm     TR Peak grad:   27.2 mmHg MV Decel Time: 123 msec     TR Vmax:        261.00 cm/s MR Peak grad: 92.5 mmHg MR Mean grad: 54.0 mmHg     SHUNTS MR Vmax:      481.00 cm/s    Systemic VTI:  0.11 m MR Vmean:     334.0 cm/s    Systemic Diam: 2.60 cm MV E velocity: 133.00 cm/s MV A velocity: 66.90 cm/s MV E/A ratio:  1.99 Nona Dell MD Electronically signed by Nona Dell MD Signature Date/Time: 11/09/2023/1:41:56 PM    Final    CT Head Wo Contrast Result Date: 11/09/2023 CLINICAL DATA:  Provided history: Mental status change, unknown cause. EXAM: CT HEAD WITHOUT CONTRAST TECHNIQUE: Contiguous axial images were obtained from the base of the skull through the vertex without intravenous contrast. RADIATION DOSE REDUCTION: This exam was performed according to the departmental dose-optimization program which includes automated exposure control, adjustment of the mA and/or kV according to patient size and/or use of iterative reconstruction technique. COMPARISON:  Head CT 10/30/2022.  Brain MRI 02/04/2022. FINDINGS: Residual circulating contrast within the dural venous sinuses. 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 Mild patchy and ill-defined hypoattenuation within the cerebral white matter, nonspecific but compatible with chronic small vessel ischemic disease. There is no acute intracranial hemorrhage. No demarcated cortical infarct. No extra-axial fluid collection. No evidence of an intracranial mass. No midline shift. Vascular: No hyperdense arterial vessel. Atherosclerotic calcifications. Skull: No calvarial fracture or aggressive osseous lesion. Sinuses/Orbits: No mass or acute finding within the imaged orbits. Partially imaged mucous retention cyst within the left maxillary sinus measuring at least 1.8 cm. IMPRESSION: 1.  No evidence of an acute intracranial abnormality. 2. 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 3. Mild cerebral white matter chronic small vessel ischemic disease. 4. Partially imaged left maxillary sinus mucous retention  cyst (measuring at least 1.8 cm). Electronically Signed   By: Jackey Loge D.O.   On: 11/09/2023 13:05   Medications:  ceFEPime (MAXIPIME) IV Stopped (11/10/23 1650)   heparin 1,550 Units/hr (11/10/23 1745)   vancomycin      aspirin  81 mg Oral Daily   atorvastatin  40 mg Oral Daily   Chlorhexidine Gluconate Cloth  6 each Topical Daily   cinacalcet  30 mg Oral Q breakfast   doxercalciferol  6 mcg Intravenous Q M,W,F-HD   insulin aspart  0-6 Units Subcutaneous TID WC   mupirocin ointment  1 Application Nasal BID   sevelamer carbonate  4,000 mg Oral TID WC     Dialysis Orders: Center: [facility] 4 hr 15 min 180NRe 450/700 105 kg 2.0 K/ 2.0 Ca UFP 4 AVF - Heparin 7000 units IV Initial bolus Heparin 2000  units IV mid run - Hectorol 7 mcg IV three times per week     Assessment/Plan:  NSTEMI-work up per cardiology/primary. Needs LHC/RHC but not ordered yet. Peak troponin 8942 11/10/2023  Fever/Hypotension- BC pending. Lactic acid 2.1. Has been started on board spectrum ABX per primary  Hyperkalemia- K+ 6.1. Was 7.5 prior HD yesterday. Stat lokelma10 grams ordered then daily. Recheck RFP at 1400  AMS- does not appear to be uremic. SCR 7.5 BUN 28 but labs drawn immediately following HD. Reviewed med list, do not see offending agents.   HFrEF-attempt to optimize volume with dialysis. ECHO today with EF 30 to 35%. Cards following. Fluid restrictions and daily weights.   ESRD -  HD 11/10/2023 on Holiday Schedule. Next HD 11/13/2023.   Hyportension/volume  - Hypotensive, not on antihypertensive meds. Will order midodrine 10 mg PO TID. Net UF 5.0 Liters  with12/24/2024. Now close to OP EDW if wts are correct.  Does not appear overtly volume overloaded at present. Did receive 1 liter of NS over night.   Anemia  - HGB 14.0-15.2. Follow HGB  Metabolic bone disease -  Last OP labs close to goal. Decrease VDRA D/T Slightly elevated calcium.  Decreased VDRA dose. On high dose Renvela  binder.  Nutrition - Renal diet when eating.   DMT2-per primary  Dene Gentry. Tekla Malachowski NP-C 11/11/2023, 9:14 AM  [employer] [phone]

## 2023-11-11 NOTE — Consult Note (Signed)
PHARMACY - ANTICOAGULATION CONSULT NOTE  Pharmacy Consult for Heparin infusion Indication: chest pain/ACS + AFib  Allergies  Allergen Reactions   Sulfa Antibiotics Diarrhea and Nausea And Vomiting    Patient Measurements: Height: 6\' 1"  (185.4 cm) Weight: 106.4 kg (234 lb 9.1 oz) (Taken via bed.) IBW/kg (Calculated) : 79.9 Heparin Dosing Weight: 103.9 kg  Vital Signs: Temp: 98.7 F (37.1 C) (12/25 0756) Temp Source: Oral (12/25 0756) BP: 108/63 (12/25 0756) Pulse Rate: 88 (12/25 0200)  Labs: Recent Labs    11/08/23 1830 11/09/23 0328 11/10/23 0359 11/10/23 0808 11/10/23 0907 11/10/23 1126 11/10/23 1503 11/11/23 0001 11/11/23 0407  HGB  --    < > 15.2  --   --   --  15.3  --  14.7  HCT  --    < > 48.3  --   --   --  47.4  --  46.3  PLT  --    < > 161  --   --   --  179  --  149*  APTT 28  --   --   --   --   --   --   --   --   LABPROT 15.2  --   --   --   --   --   --   --   --   INR 1.2  --   --   --   --   --   --   --   --   HEPARINUNFRC  --    < >  --  >1.10*  --  0.73*  --   --  >1.10*  CREATININE  --    < > 11.47*  --   --   --  8.80*  --  10.09*  TROPONINIHS 7,744*   < >  --   --  9,562*  --  7,282* 7,672*  --    < > = values in this interval not displayed.    Estimated Creatinine Clearance: 9.8 mL/min (A) (by C-G formula based on SCr of 10.09 mg/dL (H)).   Medical History: Past Medical History:  Diagnosis Date   ESRD on hemodialysis (HCC)    a. HD MWF > 10 years.   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 01/2022 Echo: EF 45-50%, glob HK, mod LVH, mildly reduced RV fxn, mild MR, mod-sev MV Ca2+. Mild AS; b. 10/2023 Echo: EF 45-50%, glob HK, GrII DD, mildly reduced RV fxn, RVSP 52.58mmHg. Mild MR/MS, mod TR.   Hypertension    Nonischemic cardiomyopathy (HCC)    a. 2019 Cath [facility]): Nonobstructive disease; b. 12/2019 Echo: EF 50%; c. 12/2019 MV (Tx w/u [facility]): Ant infarct w/ peri-infarct ischemia. EF 35%; c. 05/2021 MV (Tx w/u [facility]): Cor Ca2+, EF 53%, motion artifact, equiv study, likely low risk.   Nonobstructive coronary artery disease    a.  2019 Cath ([facility]): Left main normal, LAD 30 proximal, RCA 30 distal, EF 25%.   Obesity    Type 2 diabetes mellitus (HCC)    a. 10/2023 - admission w/ HHS.     Assessment: 54 yoM ESRD admitted with CP and AFL. Pharmacy to dose IV heparin.  Heparin level this AM elevated > 1.1 on 1550 units/hr. Per discussion with nurse, heparin draw from previous infiltration site and unclear if affecting level. Repeat level this AM also elevated at > 1.1. Per RN, level was drawn near infusion site (  in the right hand) and infusion was paused during lab draw. Patient is unable to get levels in the left arm due to limited access. Hgb wnl, plt 149. No signs of bleeding per RN.    Goal of Therapy:  Heparin level 0.3-0.7 units/ml Monitor platelets by anticoagulation protocol: Yes   Plan:  Hold heparin for 1 hour  Restart heparin at 1350 units/hr 8 hour heparin level  Monitor daily CBC and for s/sx of bleeding   Thank you for involving pharmacy in the patient's care.   Theotis Burrow, PharmD PGY1 Acute Care Pharmacy Resident  11/11/2023 10:12 AM

## 2023-11-11 NOTE — Plan of Care (Signed)
  Problem: Education: Goal: Knowledge of General Education information will improve Description: Including pain rating scale, medication(s)/side effects and non-pharmacologic comfort measures Outcome: Not Progressing   Problem: Health Behavior/Discharge Planning: Goal: Ability to manage health-related needs will improve Outcome: Not Progressing   Problem: Clinical Measurements: Goal: Ability to maintain clinical measurements within normal limits will improve Outcome: Not Progressing Goal: Will remain free from infection Outcome: Not Progressing Goal: Diagnostic test results will improve Outcome: Not Progressing Goal: Respiratory complications will improve Outcome: Not Progressing Goal: Cardiovascular complication will be avoided Outcome: Not Progressing   Problem: Activity: Goal: Risk for activity intolerance will decrease Outcome: Not Progressing   Problem: Nutrition: Goal: Adequate nutrition will be maintained Outcome: Not Progressing   Problem: Coping: Goal: Level of anxiety will decrease Outcome: Not Progressing   Problem: Elimination: Goal: Will not experience complications related to bowel motility Outcome: Not Progressing Goal: Will not experience complications related to urinary retention Outcome: Not Progressing   Problem: Pain Management: Goal: General experience of comfort will improve Outcome: Not Progressing   Problem: Safety: Goal: Ability to remain free from injury will improve Outcome: Not Progressing   Problem: Skin Integrity: Goal: Risk for impaired skin integrity will decrease Outcome: Not Progressing   Problem: Education: Goal: Ability to describe self-care measures that may prevent or decrease complications (Diabetes Survival Skills Education) will improve Outcome: Not Progressing Goal: Individualized Educational Video(s) Outcome: Not Progressing   Problem: Coping: Goal: Ability to adjust to condition or change in health will  improve Outcome: Not Progressing   Problem: Fluid Volume: Goal: Ability to maintain a balanced intake and output will improve Outcome: Not Progressing   Problem: Health Behavior/Discharge Planning: Goal: Ability to identify and utilize available resources and services will improve Outcome: Not Progressing Goal: Ability to manage health-related needs will improve Outcome: Not Progressing   Problem: Metabolic: Goal: Ability to maintain appropriate glucose levels will improve Outcome: Not Progressing   Problem: Nutritional: Goal: Maintenance of adequate nutrition will improve Outcome: Not Progressing Goal: Progress toward achieving an optimal weight will improve Outcome: Not Progressing   Problem: Skin Integrity: Goal: Risk for impaired skin integrity will decrease Outcome: Not Progressing   Problem: Tissue Perfusion: Goal: Adequacy of tissue perfusion will improve Outcome: Not Progressing

## 2023-11-11 NOTE — Progress Notes (Signed)
   11/10/23 2105  Assess: MEWS Score  Temp (!) 103 F (39.4 C)  BP 106/63  MAP (mmHg) 77  Pulse Rate 94  ECG Heart Rate 96  SpO2 90 %  Assess: MEWS Score  MEWS Temp 2  MEWS Systolic 0  MEWS Pulse 0  MEWS RR 0  MEWS LOC 0  MEWS Score 2  MEWS Score Color Yellow  Assess: if the MEWS score is Yellow or Red  Were vital signs accurate and taken at a resting state? Yes  Does the patient meet 2 or more of the SIRS criteria? Yes  Does the patient have a confirmed or suspected source of infection? No  MEWS guidelines implemented  No, previously red, continue vital signs every 4 hours  Notify: Charge Nurse/RN  Name of Charge Nurse/RN Notified Health visitor  Provider Notification  Provider Name/Title Julian Reil MD  Date Provider Notified 11/10/23  Time Provider Notified 2030  Method of Notification Face-to-face  Notification Reason Change in status  Provider response En route  Date of Provider Response 11/10/23  Time of Provider Response 2030  Assess: SIRS CRITERIA  SIRS Temperature  1  SIRS Respirations  0  SIRS Pulse 1  SIRS WBC 0  SIRS Score Sum  2

## 2023-11-11 NOTE — Progress Notes (Signed)
Critical value: Potassium 6.3   Dr. Malen Gauze /w nephrology notified per care instruction order and critical value requirement. Pt to receive dialysis tonight instead of in AM

## 2023-11-11 NOTE — Progress Notes (Signed)
ABG attempted 2x unsuccessfully. I asked another RT to try to obtain.

## 2023-11-11 NOTE — Progress Notes (Signed)
ABG collected and sent down to lab at this time

## 2023-11-11 NOTE — Progress Notes (Signed)
Rounding Note    Patient Name: [name] HAMMERSCHMIDT Sr. Date of Encounter: 11/11/2023  [facility] Cardiologist: [facility]  Subjective   Patient is somewhat somnolent today answering questions with one-word answers.  This does not seem to have changed much since his initial presentation on 11/09/2023.  Inpatient Medications    Scheduled Meds:  aspirin  81 mg Oral Daily   atorvastatin  40 mg Oral Daily   Chlorhexidine Gluconate Cloth  6 each Topical Daily   cinacalcet  30 mg Oral Q breakfast   doxercalciferol  6 mcg Intravenous Q M,W,F-HD   insulin aspart  0-6 Units Subcutaneous TID WC   mupirocin ointment  1 Application Nasal BID   sevelamer carbonate  4,000 mg Oral TID WC   Continuous Infusions:  ceFEPime (MAXIPIME) IV Stopped (11/10/23 1650)   heparin 1,550 Units/hr (11/10/23 1745)   vancomycin     PRN Meds: acetaminophen **OR** acetaminophen, ondansetron **OR** ondansetron (ZOFRAN) IV, mouth rinse, sevelamer carbonate   Vital Signs    Vitals:   11/11/23 0120 11/11/23 0140 11/11/23 0200 11/11/23 0756  BP: 103/62 (!) 89/42 104/67 108/63  Pulse: 88 88 88   Resp:    18  Temp:   99.9 F (37.7 C) 98.7 F (37.1 C)  TempSrc:   Oral Oral  SpO2: 98% 97% 99% 95%  Weight:      Height:        Intake/Output Summary (Last 24 hours) at 11/11/2023 0827 Last data filed at 11/10/2023 1810 Gross per 24 hour  Intake 0 ml  Output 5000 ml  Net -5000 ml      11/10/2023   11:57 AM 11/10/2023    7:44 AM 11/10/2023    7:27 AM  Last 3 Weights  Weight (lbs) 234 lb 9.1 oz 245 lb 9.5 oz 245 lb 9.5 oz  Weight (kg) 106.4 kg 111.4 kg 111.4 kg      Telemetry    Sinus rhythm- Personally Reviewed  ECG    Sinus rhythm at 91 with left bundle branch block- Personally Reviewed  Physical Exam   GEN: No acute distress.   Neck: No JVD Cardiac: RRR, soft outflow tract murmur rubs, or gallops.  Respiratory: Clear to auscultation bilaterally. GI: Soft, nontender,  non-distended  MS: No edema; No deformity. Neuro:  Nonfocal  Psych: Normal affect  Extremities-right BKA  Labs    High Sensitivity Troponin:   Recent Labs  Lab 11/09/23 1111 11/09/23 1314 11/10/23 0907 11/10/23 1503 11/11/23 0001  TROPONINIHS 8,442* 8,849* 8,942* 7,282* 7,672*     Chemistry Recent Labs  Lab 11/08/23 1658 11/09/23 1945 11/10/23 0359 11/10/23 1503 11/11/23 0407  NA 130*  --  129* 130* 128*  K 4.5  --  >7.5* 5.9* 6.1*  CL 91*  --  90* 89* 90*  CO2 26  --  21* 19* 20*  GLUCOSE 290*  --  262* 196* 259*  BUN 28*  --  43* 32* 43*  CREATININE 7.50*   < > 11.47* 8.80* 10.09*  CALCIUM 10.3  --  9.9 9.7 9.3  PROT 8.3*  --   --  9.3*  --   ALBUMIN 3.3*  --  3.4* 3.5 3.0*  AST 44*  --   --  117*  --   ALT 34  --   --  67*  --   ALKPHOS 127*  --   --  135*  --   BILITOT 0.6  --   --  1.0  --   GFRNONAA 8*   < > 5* 6* 5*  ANIONGAP 13  --  18* 22* 18*   < > = values in this interval not displayed.    Lipids No results for input(s): "CHOL", "TRIG", "HDL", "LABVLDL", "LDLCALC", "CHOLHDL" in the last 168 hours.  Hematology Recent Labs  Lab 11/10/23 0359 11/10/23 1503 11/11/23 0407  WBC 8.1 9.6 10.7*  RBC 5.26 5.30 5.07  HGB 15.2 15.3 14.7  HCT 48.3 47.4 46.3  MCV 91.8 89.4 91.3  MCH 28.9 28.9 29.0  MCHC 31.5 32.3 31.7  RDW 15.6* 15.7* 15.7*  PLT 161 179 149*   Thyroid No results for input(s): "TSH", "FREET4" in the last 168 hours.  BNPNo results for input(s): "BNP", "PROBNP" in the last 168 hours.  DDimer No results for input(s): "DDIMER" in the last 168 hours.   Radiology    DG Chest Port 1 View Result Date: 11/10/2023 CLINICAL DATA:  Dyspnea.  Possible sepsis. EXAM: PORTABLE CHEST 1 VIEW COMPARISON:  11/08/2023 FINDINGS: Cardiac enlargement. Mild central vascular congestion. Shallow inspiration. No airspace disease or consolidation in the lungs. No pleural effusions. No pneumothorax. Calcification of the aorta. Degenerative changes in the spine and  shoulders. IMPRESSION: Cardiac enlargement with central vascular congestion. No edema or consolidation. Electronically Signed   By: Burman Nieves M.D.   On: 11/10/2023 19:29   ECHOCARDIOGRAM COMPLETE Result Date: 11/09/2023    ECHOCARDIOGRAM REPORT   Patient Name:   [name] MERGEL Sr. Date of Exam: 11/09/2023 Medical Rec #:  [id number]         Height:       73.0 in Accession #:    [id number]        Weight:       250.0 lb Date of Birth:  05/21/1961        BSA:          2.366 m Patient Age:    62 years          BP:           126/56 mmHg Patient Gender: M                 HR:           89 bpm. [facility] Procedure: 2D Echo, Cardiac Doppler and Color Doppler Indications:    Chest Pain R07.9  History:        Patient has prior history of Echocardiogram examinations, most                 recent 10/25/2023. Cardiomyopathy and CHF, Previous Myocardial                 Infarction and CAD; Risk Factors:Hypertension and Diabetes.  Sonographer:    Celesta Gentile RCS Referring Phys: [phone] CHRISTOPHER RONALD BERGE IMPRESSIONS  1. Left ventricular ejection fraction, by estimation, is 30 to 35%. The left ventricle has moderately decreased function. The left ventricle demonstrates regional wall motion abnormalities (see scoring diagram/findings for description). There is moderate concentric left ventricular hypertrophy. Left ventricular diastolic parameters are consistent with Grade III diastolic dysfunction (restrictive).  2. Right ventricular systolic function is mildly reduced. The right ventricular size is normal. There is mildly elevated pulmonary artery systolic pressure. The estimated right ventricular systolic pressure is 42.2 mmHg.  3. Left atrial size was moderately dilated.  4. The mitral valve is degenerative. Trivial mitral valve regurgitation. Moderate mitral annular calcification.  5. The aortic valve  is tricuspid. There is moderate calcification of the aortic valve. Aortic valve regurgitation is not  visualized. Moderate to severe aortic valve stenosis - suspect upper end of scale, low flow/low gradient. Aortic valve area, by VTI measures 0.98 cm. Aortic valve mean gradient measures 22.0 mmHg. Dimentionless index 0.19.  6. Aortic dilatation noted. There is borderline dilatation of the aortic root, measuring 40 mm.  7. The inferior vena cava is dilated in size with <50% respiratory variability, suggesting right atrial pressure of 15 mmHg. Comparison(s): Prior images reviewed side by side. LVEF has decreased, 30-35% range with basal inferoposterior akinesis and restrictive diastolic filling. Aortic stenosis possibly severe (low flow/low gradient). Mildly elevated RVSP. FINDINGS  Left Ventricle: Left ventricular ejection fraction, by estimation, is 30 to 35%. The left ventricle has moderately decreased function. The left ventricle demonstrates regional wall motion abnormalities. The left ventricular internal cavity size was normal in size. There is moderate concentric left ventricular hypertrophy. Left ventricular diastolic parameters are consistent with Grade III diastolic dysfunction (restrictive).  LV Wall Scoring: The inferior wall, posterior wall, and basal anterolateral segment are akinetic. The entire anterior wall, entire septum, entire apex, and mid anterolateral segment are hypokinetic. Right Ventricle: The right ventricular size is normal. No increase in right ventricular wall thickness. Right ventricular systolic function is mildly reduced. There is mildly elevated pulmonary artery systolic pressure. The tricuspid regurgitant velocity  is 2.61 m/s, and with an assumed right atrial pressure of 15 mmHg, the estimated right ventricular systolic pressure is 42.2 mmHg. Left Atrium: Left atrial size was moderately dilated. Right Atrium: Right atrial size was normal in size. Pericardium: There is no evidence of pericardial effusion. Mitral Valve: The mitral valve is degenerative in appearance. There is mild  thickening of the mitral valve leaflet(s). There is mild calcification of the mitral valve leaflet(s). Moderate mitral annular calcification. Trivial mitral valve regurgitation. Tricuspid Valve: The tricuspid valve is grossly normal. Tricuspid valve regurgitation is mild. Aortic Valve: The aortic valve is tricuspid. There is moderate calcification of the aortic valve. There is moderate aortic valve annular calcification. Aortic valve regurgitation is not visualized. Moderate to severe aortic stenosis is present. Aortic valve mean gradient measures 22.0 mmHg. Aortic valve peak gradient measures 32.0 mmHg. Aortic valve area, by VTI measures 0.98 cm. Pulmonic Valve: The pulmonic valve was grossly normal. Pulmonic valve regurgitation is trivial. Aorta: Aortic dilatation noted. There is borderline dilatation of the aortic root, measuring 40 mm. Venous: The inferior vena cava is dilated in size with less than 50% respiratory variability, suggesting right atrial pressure of 15 mmHg. IAS/Shunts: No atrial level shunt detected by color flow Doppler.  LEFT VENTRICLE PLAX 2D LVIDd:         5.80 cm      Diastology LVIDs:         4.80 cm      LV e' medial:    4.68 cm/s LV PW:         1.40 cm      LV E/e' medial:  28.4 LV IVS:        1.40 cm      LV e' lateral:   5.33 cm/s LVOT diam:     2.60 cm      LV E/e' lateral: 25.0 LV SV:         57 LV SV Index:   24 LVOT Area:     5.31 cm  LV Volumes (MOD) LV vol d, MOD A2C: 169.0 ml LV vol d, MOD  A4C: 184.0 ml LV vol s, MOD A2C: 97.6 ml LV vol s, MOD A4C: 111.0 ml LV SV MOD A2C:     71.4 ml LV SV MOD A4C:     184.0 ml LV SV MOD BP:      72.1 ml RIGHT VENTRICLE RV S prime:     6.31 cm/s TAPSE (M-mode): 1.4 cm LEFT ATRIUM              Index        RIGHT ATRIUM           Index LA diam:        5.10 cm  2.16 cm/m   RA Area:     20.40 cm LA Vol (A2C):   105.0 ml 44.38 ml/m  RA Volume:   61.40 ml  25.95 ml/m LA Vol (A4C):   101.0 ml 42.69 ml/m LA Biplane Vol: 102.0 ml 43.12 ml/m  AORTIC  VALVE AV Area (Vmax):    1.04 cm AV Area (Vmean):   0.96 cm AV Area (VTI):     0.98 cm AV Vmax:           283.00 cm/s AV Vmean:          219.000 cm/s AV VTI:            0.583 m AV Peak Grad:      32.0 mmHg AV Mean Grad:      22.0 mmHg LVOT Vmax:         55.40 cm/s LVOT Vmean:        39.400 cm/s LVOT VTI:          0.108 m LVOT/AV VTI ratio: 0.19  AORTA Ao Root diam: 4.00 cm MITRAL VALVE                TRICUSPID VALVE MV Area (PHT): 6.17 cm     TR Peak grad:   27.2 mmHg MV Decel Time: 123 msec     TR Vmax:        261.00 cm/s MR Peak grad: 92.5 mmHg MR Mean grad: 54.0 mmHg     SHUNTS MR Vmax:      481.00 cm/s   Systemic VTI:  0.11 m MR Vmean:     334.0 cm/s    Systemic Diam: 2.60 cm MV E velocity: 133.00 cm/s MV A velocity: 66.90 cm/s MV E/A ratio:  1.99 Nona Dell MD Electronically signed by Nona Dell MD Signature Date/Time: 11/09/2023/1:41:56 PM    Final    CT Head Wo Contrast Result Date: 11/09/2023 CLINICAL DATA:  Provided history: Mental status change, unknown cause. EXAM: CT HEAD WITHOUT CONTRAST TECHNIQUE: Contiguous axial images were obtained from the base of the skull through the vertex without intravenous contrast. RADIATION DOSE REDUCTION: This exam was performed according to the departmental dose-optimization program which includes automated exposure control, adjustment of the mA and/or kV according to patient size and/or use of iterative reconstruction technique. COMPARISON:  Head CT 10/30/2022.  Brain MRI 02/04/2022. FINDINGS: Residual circulating contrast within the dural venous sinuses. 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 Mild patchy and ill-defined hypoattenuation within the cerebral white matter, nonspecific but compatible with chronic small vessel ischemic disease. There is no acute intracranial hemorrhage. No demarcated cortical infarct. No extra-axial fluid  collection. No evidence of an intracranial mass. No midline shift. Vascular: No hyperdense arterial vessel. Atherosclerotic calcifications. Skull: No calvarial  fracture or aggressive osseous lesion. Sinuses/Orbits: No mass or acute finding within the imaged orbits. Partially imaged mucous retention cyst within the left maxillary sinus measuring at least 1.8 cm. IMPRESSION: 1.  No evidence of an acute intracranial abnormality. 2. 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 3. Mild cerebral white matter chronic small vessel ischemic disease. 4. Partially imaged left maxillary sinus mucous retention cyst (measuring at least 1.8 cm). Electronically Signed   By: Jackey Loge D.O.   On: 11/09/2023 13:05    Cardiac Studies   2D echo (11/09/2023)  IMPRESSIONS     1. Left ventricular ejection fraction, by estimation, is 30 to 35%. The  left ventricle has moderately decreased function. The left ventricle  demonstrates regional wall motion abnormalities (see scoring  diagram/findings for description). There is  moderate concentric left ventricular hypertrophy. Left ventricular  diastolic parameters are consistent with Grade III diastolic dysfunction  (restrictive).   2. Right ventricular systolic function is mildly reduced. The right  ventricular size is normal. There is mildly elevated pulmonary artery  systolic pressure. The estimated right ventricular systolic pressure is  42.2 mmHg.   3. Left atrial size was moderately dilated.   4. The mitral valve is degenerative. Trivial mitral valve regurgitation.  Moderate mitral annular calcification.   5. The aortic valve is tricuspid. There is moderate calcification of the  aortic valve. Aortic valve regurgitation is not visualized. Moderate to  severe aortic valve stenosis - suspect upper end of scale, low flow/low  gradient. Aortic valve area, by VTI  measures 0.98 cm. Aortic valve mean gradient measures 22.0 mmHg.   Dimentionless index 0.19.   6. Aortic dilatation noted. There is borderline dilatation of the aortic  root, measuring 40 mm.   7. The inferior vena cava is dilated in size with <50% respiratory  variability, suggesting right atrial pressure of 15 mmHg.   Patient Profile     Sufiyan Mcgaffigan. is a 62 y.o. 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 Jeraldine Loots.   Assessment & Plan    1: Non-STEMI-troponins increased to 8942.  Patient has chronic left bundle branch block.  He has nonobstructive CAD by cath in 2019 with several low risk Myoview since that time.  He currently is on IV heparin.  He denies chest pain although on original presentation he was complaining of chest pain especially, during dialysis.  Given presentation and elevated troponins consider right left heart cath once mentation improves.  2: Altered mental status-head CT nonacute.  Question whether this is metabolic.  3: Heart failure with reduced ejection fraction-EF by echo this admission was 30 to 35%, lower than it had been in the past.  His blood pressure precludes optimal medical therapy.  He apparently received an additional liter of fluid since dialysis because of hypotension.  Blood pressure is hovering in the systolic 100 range.  4: Aortic stenosis-question severe aortic stenosis (low gradient/low flow).  Especially with chest pain during dialysis and hypertension.  Will need right left heart cath to evaluate once mental status clears.  5: End-stage renal disease-on hemodialysis.  He was last dialyzed on the 23rd.  His serum creatinine is up to 10 today with potassium of 6.1.  He apparently is scheduled for dialysis on the  27th.  I suspect he  will need dialysis sooner than that.      For questions or updates, please contact [facility] Please consult [email or web address] for contact info under        Signed, Nanetta Batty, MD  11/11/2023, 8:27 AM

## 2023-11-11 NOTE — Consult Note (Signed)
PHARMACY - ANTICOAGULATION CONSULT NOTE  Pharmacy Consult for Heparin infusion Indication: chest pain/ACS + AFib  Allergies  Allergen Reactions   Sulfa Antibiotics Diarrhea and Nausea And Vomiting    Patient Measurements: Height: 6\' 1"  (185.4 cm) Weight: 106.4 kg (234 lb 9.1 oz) (Taken via bed.) IBW/kg (Calculated) : 79.9 Heparin Dosing Weight: 103.9 kg  Vital Signs: Temp: 99.9 F (37.7 C) (12/25 1946) Temp Source: Axillary (12/25 1946) BP: 127/61 (12/25 1946) Pulse Rate: 87 (12/25 1946)  Labs: Recent Labs    11/10/23 0359 11/10/23 0808 11/10/23 0907 11/10/23 1126 11/10/23 1503 11/11/23 0001 11/11/23 0407 11/11/23 0913 11/11/23 1647 11/11/23 2046  HGB 15.2  --   --   --  15.3  --  14.7  --   --   --   HCT 48.3  --   --   --  47.4  --  46.3  --   --   --   PLT 161  --   --   --  179  --  149*  --   --   --   HEPARINUNFRC  --    < >  --    < >  --   --  >1.10* >1.10*  --  0.33  CREATININE 11.47*  --   --   --  8.80*  --  10.09*  --  11.53*  --   TROPONINIHS  --   --  1,610*  --  7,282* 7,672*  --   --   --   --    < > = values in this interval not displayed.    Estimated Creatinine Clearance: 8.6 mL/min (A) (by C-G formula based on SCr of 11.53 mg/dL (H)).   Medical History: Past Medical History:  Diagnosis Date   ESRD on hemodialysis (HCC)    a. HD MWF > 10 years.   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 01/2022 Echo: EF 45-50%, glob HK, mod LVH, mildly reduced RV fxn, mild MR, mod-sev MV Ca2+. Mild AS; b. 10/2023 Echo: EF 45-50%, glob HK, GrII DD, mildly reduced RV fxn, RVSP 52.54mmHg. Mild MR/MS, mod TR.   Hypertension    Nonischemic cardiomyopathy (HCC)    a. 2019 Cath [facility]): Nonobstructive disease; b. 12/2019 Echo: EF 50%; c. 12/2019 MV (Tx w/u [facility]): Ant infarct w/ peri-infarct ischemia. EF 35%; c. 05/2021 MV (Tx w/u [facility]): Cor Ca2+, EF 53%, motion artifact, equiv study, likely low risk.   Nonobstructive coronary artery disease     a.  2019 Cath ([facility]): Left main normal, LAD 30 proximal, RCA 30 distal, EF 25%.   Obesity    Type 2 diabetes mellitus (HCC)    a. 10/2023 - admission w/ HHS.     Assessment: 62 yoM ESRD admitted with CP and AFL. Pharmacy to dose IV heparin.  Heparin level this AM elevated > 1.1 on 1550 units/hr. Per discussion with nurse, heparin draw from previous infiltration site and unclear if affecting level. Repeat level this AM also elevated at > 1.1. Per RN, level was drawn near infusion site (in the right hand) and infusion was paused during lab draw. Patient is unable to get levels in the left arm due to limited access. Hgb wnl, plt 149. No signs of bleeding per RN.   PM: HL 0.33 drawn from fistula (no heparin) on 1350 units/hr. No issues with the heparin infusion or bleeding reported.  Goal of Therapy:  Heparin level 0.3-0.7  units/ml Monitor platelets by anticoagulation protocol: Yes   Plan:  Continue heparin at 1350 units/hr Recheck level with AM labs Monitor daily CBC and for s/sx of bleeding   Thank you for involving pharmacy in the patient's care.   Loralee Pacas, PharmD, BCPS 11/11/2023 9:34 PM  Please check AMION for all [facility] Pharm[facility] phone numbers After 10:00 PM, call Main Pharmacy [phone]

## 2023-11-12 DIAGNOSIS — G039 Meningitis, unspecified: Secondary | ICD-10-CM

## 2023-11-12 DIAGNOSIS — I214 Non-ST elevation (NSTEMI) myocardial infarction: Secondary | ICD-10-CM | POA: Diagnosis not present

## 2023-11-12 LAB — RENAL FUNCTION PANEL
Albumin: 3.1 g/dL — ABNORMAL LOW (ref 3.5–5.0)
Anion gap: 18 — ABNORMAL HIGH (ref 5–15)
BUN: 35 mg/dL — ABNORMAL HIGH (ref 8–23)
CO2: 21 mmol/L — ABNORMAL LOW (ref 22–32)
Calcium: 9.6 mg/dL (ref 8.9–10.3)
Chloride: 93 mmol/L — ABNORMAL LOW (ref 98–111)
Creatinine, Ser: 8.39 mg/dL — ABNORMAL HIGH (ref 0.61–1.24)
GFR, Estimated: 7 mL/min — ABNORMAL LOW (ref >60)
Glucose, Bld: 214 mg/dL — ABNORMAL HIGH (ref 70–99)
Phosphorus: 6 mg/dL — ABNORMAL HIGH (ref 2.5–4.6)
Potassium: 4.6 mmol/L (ref 3.5–5.1)
Sodium: 132 mmol/L — ABNORMAL LOW (ref 135–145)

## 2023-11-12 LAB — GLUCOSE, CAPILLARY
Glucose-Capillary: 195 mg/dL — ABNORMAL HIGH (ref 70–99)
Glucose-Capillary: 211 mg/dL — ABNORMAL HIGH (ref 70–99)
Glucose-Capillary: 277 mg/dL — ABNORMAL HIGH (ref 70–99)
Glucose-Capillary: 338 mg/dL — ABNORMAL HIGH (ref 70–99)

## 2023-11-12 LAB — C DIFFICILE QUICK SCREEN W PCR REFLEX
C Diff antigen: NEGATIVE
C Diff interpretation: NOT DETECTED
C Diff toxin: NEGATIVE

## 2023-11-12 LAB — CBC
HCT: 45.2 % (ref 39.0–52.0)
Hemoglobin: 14.4 g/dL (ref 13.0–17.0)
MCH: 28.6 pg (ref 26.0–34.0)
MCHC: 31.9 g/dL (ref 30.0–36.0)
MCV: 89.7 fL (ref 80.0–100.0)
Platelets: 155 10*3/uL (ref 150–400)
RBC: 5.04 MIL/uL (ref 4.22–5.81)
RDW: 15.4 % (ref 11.5–15.5)
WBC: 9.3 10*3/uL (ref 4.0–10.5)
nRBC: 0.4 % — ABNORMAL HIGH (ref 0.0–0.2)

## 2023-11-12 LAB — HEPARIN LEVEL (UNFRACTIONATED): Heparin Unfractionated: 0.48 [IU]/mL (ref 0.30–0.70)

## 2023-11-12 MED ORDER — ALBUMIN HUMAN 25 % IV SOLN
25.0000 g | Freq: Once | INTRAVENOUS | Status: AC
  Administered 2023-11-12: 25 g via INTRAVENOUS
  Filled 2023-11-12 (×2): qty 100

## 2023-11-12 MED ORDER — SODIUM CHLORIDE 0.9 % IV SOLN
2.0000 g | Freq: Two times a day (BID) | INTRAVENOUS | Status: DC
  Administered 2023-11-13 – 2023-11-18 (×11): 2 g via INTRAVENOUS
  Filled 2023-11-12 (×16): qty 2000

## 2023-11-12 MED ORDER — DEXTROSE 5 % IV SOLN
5.0000 mg/kg | INTRAVENOUS | Status: DC
  Administered 2023-11-12 – 2023-11-17 (×6): 450 mg via INTRAVENOUS
  Filled 2023-11-12 (×7): qty 9

## 2023-11-12 MED ORDER — LIDOCAINE-PRILOCAINE 2.5-2.5 % EX CREA
1.0000 | TOPICAL_CREAM | CUTANEOUS | Status: DC | PRN
Start: 2023-11-12 — End: 2023-11-12

## 2023-11-12 MED ORDER — LIDOCAINE HCL (PF) 1 % IJ SOLN
5.0000 mL | INTRAMUSCULAR | Status: DC | PRN
Start: 2023-11-12 — End: 2023-11-12

## 2023-11-12 MED ORDER — SODIUM CHLORIDE 0.9 % IV SOLN: INTRAVENOUS | Status: AC

## 2023-11-12 MED ORDER — PENTAFLUOROPROP-TETRAFLUOROETH EX AERO
1.0000 | INHALATION_SPRAY | CUTANEOUS | Status: DC | PRN
Start: 2023-11-12 — End: 2023-11-12

## 2023-11-12 MED ORDER — VANCOMYCIN HCL IN DEXTROSE 1-5 GM/200ML-% IV SOLN
1000.0000 mg | Freq: Once | INTRAVENOUS | Status: AC
  Administered 2023-11-12: 1000 mg via INTRAVENOUS
  Filled 2023-11-12: qty 200

## 2023-11-12 MED ORDER — INSULIN GLARGINE-YFGN 100 UNIT/ML ~~LOC~~ SOLN
8.0000 [IU] | Freq: Every day | SUBCUTANEOUS | Status: DC
  Administered 2023-11-12 – 2023-11-13 (×2): 8 [IU] via SUBCUTANEOUS
  Filled 2023-11-12 (×2): qty 0.08

## 2023-11-12 MED ORDER — SODIUM CHLORIDE 0.9 % IV SOLN
2.0000 g | Freq: Two times a day (BID) | INTRAVENOUS | Status: DC
  Administered 2023-11-13 – 2023-11-18 (×11): 2 g via INTRAVENOUS
  Filled 2023-11-12 (×12): qty 20

## 2023-11-12 MED ORDER — HEPARIN SODIUM (PORCINE) 1000 UNIT/ML DIALYSIS
1000.0000 [IU] | INTRAMUSCULAR | Status: DC | PRN
Start: 2023-11-12 — End: 2023-11-12

## 2023-11-12 NOTE — Progress Notes (Signed)
[facility] Progress Note   Subjective: Seen in room with RN at bedside. Patient opens eyes to verbal stimuli then closes them again. Will make eye contact. No answering questions or talking.   Urgent HD over night for hyperkalemia. Net UF 1.9 L Now under OP EDW. Lab at bedside drawing repeat K+.   Objective Vitals:   11/12/23 0410 11/12/23 0510 11/12/23 0610 11/12/23 0814  BP: 95/62 (!) 89/49 (!) 93/48 (!) 97/57  Pulse: 94 93 86 82  Resp: 19 20 (!) 23 19  Temp: (!) 102.2 F (39 C) (!) 102 F (38.9 C)    TempSrc: Oral     SpO2: 91% 97% 95% 95%  Weight:      Height:       General: Chronically ill appearing male in no acute distress. Head: Normocephalic, atraumatic, sclera non-icteric, mucus membranes are moist Neck: Supple. No JVD Lungs: Bilateral breath sounds decreased in bases R>L CTAB No WOB.  Heart: SR with S1 S2 3/6 systolic M 5th ICS MCL. No R/G.  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 no edema LLE Neuro: Opens eyes to verbal, not following commands or speaking  Dialysis Access: L AVF + T/B  Additional Objective Labs: Basic Metabolic Panel: Recent Labs  Lab 11/10/23 0359 11/10/23 1503 11/11/23 0407 11/11/23 1647  NA 129* 130* 128* 127*  K >7.5* 5.9* 6.1* 6.3*  CL 90* 89* 90* 90*  CO2 21* 19* 20* 21*  GLUCOSE 262* 196* 259* 275*  BUN 43* 32* 43* 56*  CREATININE 11.47* 8.80* 10.09* 11.53*  CALCIUM 9.9 9.7 9.3 10.1  PHOS 6.3*  --  6.5* 7.2*   Liver Function Tests: Recent Labs  Lab 11/08/23 1658 11/10/23 0359 11/10/23 1503 11/11/23 0407 11/11/23 1647  AST 44*  --  117*  --   --   ALT 34  --  67*  --   --   ALKPHOS 127*  --  135*  --   --   BILITOT 0.6  --  1.0  --   --   PROT 8.3*  --  9.3*  --   --   ALBUMIN 3.3*   < > 3.5 3.0* 3.3*   < > = values in this interval not displayed.   No results for input(s): "LIPASE", "AMYLASE" in the last 168 hours. CBC: Recent Labs  Lab 11/08/23 1658 11/09/23 0328  11/09/23 1945 11/10/23 0359 11/10/23 1503 11/11/23 0407  WBC 6.3 7.0 8.1 8.1 9.6 10.7*  NEUTROABS 3.7  --   --   --  6.6  --   HGB 14.4 14.0 15.1 15.2 15.3 14.7  HCT 44.6 44.2 47.9 48.3 47.4 46.3  MCV 91.6 92.1 91.9 91.8 89.4 91.3  PLT 176 170 173 161 179 149*   Blood Culture    Component Value Date/Time   SDES BLOOD RIGHT ARM 11/10/2023 1508   SPECREQUEST  11/10/2023 1508    BOTTLES DRAWN AEROBIC ONLY Blood Culture results may not be optimal due to an inadequate volume of blood received in culture bottles   CULT  11/10/2023 1508    NO GROWTH 2 DAYS Performed at [facility], [address], [place], [place] [address]    REPTSTATUS PENDING 11/10/2023 1508    Cardiac Enzymes: No results for input(s): "CKTOTAL", "CKMB", "CKMBINDEX", "TROPONINI" in the last 168 hours. CBG: Recent Labs  Lab 11/11/23 0803 11/11/23 1126 11/11/23 1617 11/12/23 0023 11/12/23 0739  GLUCAP 250* 275* 270* 195* 211*  Iron Studies: No results for input(s): "IRON", "TIBC", "TRANSFERRIN", "FERRITIN" in the last 72 hours. @lablastinr3 @ Studies/Results: MR BRAIN WO CONTRAST Result Date: 11/11/2023 CLINICAL DATA:  Mental status change, persistent or worsening. Single episode during hemodialysis. Chest pain and hypotension. EXAM: MRI HEAD WITHOUT CONTRAST TECHNIQUE: Multiplanar, multiecho pulse sequences of the brain and surrounding structures were obtained without intravenous contrast. COMPARISON:  CT head without contrast 11/09/2023. MR head 02/04/2022 FINDINGS: Brain: Somewhat ill-defined restricted diffusion is present within the splenium of the corpus callosum. No other restricted diffusion is present. Mild atrophy and white matter changes are otherwise stable. Remote lacunar infarcts of the basal ganglia and thalami are again noted bilaterally. The ventricles are proportionate to the degree of atrophy. No significant extraaxial fluid collection is present. The brainstem and cerebellum are within  normal limits. The internal auditory canals are within normal limits. Midline structures are within normal limits. Vascular: Flow is present in the major intracranial arteries. Skull and upper cervical spine: The craniocervical junction is normal. Upper cervical spine is within normal limits. Marrow signal is unremarkable. Sinuses/Orbits: A left mastoid effusion is present. A polyp or mucous retention cyst is present within the left maxillary sinus. The paranasal sinuses and mastoid air cells are otherwise clear. Right lens replacement is noted. Globes and orbits are otherwise within normal limits. Other: IMPRESSION: 1. Somewhat ill-defined restricted diffusion within the splenium of the corpus callosum. 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 Various etiologies can lead to this appearance including seizures, metabolic disturbance or infection. Demyelination, infarct or posterior reversible encephalopathy syndrome are considered less likely. 2. No other acute intracranial abnormality. 3. Stable atrophy and white matter disease. This likely reflects the sequela of chronic microvascular ischemia. 4. Remote lacunar infarcts of the basal ganglia and thalami bilaterally. 5. Left mastoid effusion. 6. Polyp or mucous retention cyst of the left maxillary sinus. Electronically Signed   By: Marin Roberts M.D.   On: 11/11/2023 15:01   DG Chest Port 1 View Result Date: 11/10/2023 CLINICAL DATA:  Dyspnea.  Possible sepsis. EXAM: PORTABLE CHEST 1 VIEW COMPARISON:  11/08/2023 FINDINGS: Cardiac enlargement. Mild central vascular congestion. Shallow inspiration. No airspace disease or consolidation in the lungs. No pleural effusions. No pneumothorax. Calcification of the aorta. Degenerative changes in the spine and shoulders. IMPRESSION: Cardiac enlargement with central vascular congestion. No edema or consolidation. Electronically Signed   By: Burman Nieves M.D.   On:  11/10/2023 19:29   Medications:  heparin 1,350 Units/hr (11/12/23 0981)   piperacillin-tazobactam (ZOSYN)  IV Stopped (11/12/23 0433)   vancomycin      aspirin  81 mg Oral Daily   atorvastatin  40 mg Oral Daily   Chlorhexidine Gluconate Cloth  6 each Topical Daily   cinacalcet  30 mg Oral Q breakfast   doxercalciferol  6 mcg Intravenous Q M,W,F-HD   insulin aspart  0-6 Units Subcutaneous TID WC   midodrine  10 mg Oral TID WC   mupirocin ointment  1 Application Nasal BID   sevelamer carbonate  4,000 mg Oral TID WC   sodium zirconium cyclosilicate  10 g Oral Daily     Dialysis Orders: Center: [facility] 4 hr 15 min 180NRe 450/700 105 kg 2.0 K/ 2.0 Ca UFP 4 AVF - Heparin 7000 units IV Initial bolus Heparin 2000  units IV mid run - Hectorol 7 mcg IV three times per week     Assessment/Plan:  NSTEMI-work up per cardiology/primary. Needs  LHC/RHC but not ordered yet. Peak troponin 8942 11/10/2023  Fever/Hypotension- BC pending. Lactic acid 2.1. Has been started on board spectrum ABX per primary  Hyperkalemia- Urgent HD overnight after patient failed to respond to [facility]. Checking labs today.   AMS- Remains minimally responsive. Labs and multiple HD treatments rule out uremia. CT of head unremarkable. Stopped cefepime 11/11/2023. Per primary  HFrEF-attempt to optimize volume with dialysis. ECHO today with EF 30 to 35%. Cards following. Fluid restrictions and daily weights.   ESRD -  HD 11/10/2023 on Holiday Schedule. Urgent HD 11/11/2023 for hyperkalemia. Next HD 11/13/2023.  Does not appear overtly volume overloaded at present. Did receive 1 liter of NS over night.   Anemia  - HGB 14.0-15.2. Follow HGB  Metabolic bone disease -  Last OP labs close to goal. Decrease VDRA D/T Slightly elevated calcium.  Decreased VDRA dose. On high dose Renvela binder.  Nutrition - Renal diet when eating.   DMT2-per primary  Dene Gentry. Marvin Maenza NP-C 11/12/2023, 8:50 AM  [employer] [phone]

## 2023-11-12 NOTE — Consult Note (Signed)
NEUROLOGY CONSULT NOTE   Date of service: November 12, 2023 Patient Name: [name] SAILE Sr. MRN:  [id number] DOB:  1961-06-09 Chief Complaint:  Confusion Requesting Provider: Dorcas Carrow, MD  History of Present Illness  62 year old male with a PMHx of ESRD on dialysis TTS, chronic HFmrEF, insulin-dependent diabetes, hypertension, PVD status post right BKA, history of stroke, hypergammaglobulinemia, hyperferritinemia, recent prior admissions for hyperglycemia with CBG > 600 in the setting of noncompliance with home insulin, followed by a subsequent admission for a fall and recurrent hyperglycemia with blood glucose of 472, QT prolongation, recent T wave inversions on EKG, who re-presented to the ED via EMS on 12/22 for severe hypotension after being called for unremitting coughing with near-syncope and SOB while at dialysis. Troponin was markedly elevated at 7689. Presentation was suspicious for NSTEMI. Cardiology was consulted and he was started on a heparin drip in conjunction with IVF and ASA.  CT head showed no acute intracranial process, with chronic lacunar infarcts noted.   Overnight on 12/23 he became "groggy" and somnolent. Follow-up echocardiogram showed reduction in LVEF, now 30 to 35% range with global hypokinesis and basal inferoposterior akinesis. Aortic stenosis was potentially severe. Cardiology is considering cardiac catheterization for evaluation of coronary anatomy and further assessment of aortic stenosis, presuming mental status clears and he is able to consent.  Yesterday (12/25) he was noted to be significantly worse in terms of cognition. After receiving dialysis overnight he is somewhat improved, being able to open eyes and answer with one-word replies, but still somnolent.     ROS  Unable to obtain due to somnolence.   Past History   Past Medical History:  Diagnosis Date   ESRD on hemodialysis (HCC)    a. HD MWF > 10 years.   Heart failure with mid-range  ejection fraction (HCC)    a. 01/2022 Echo: EF 45-50%, glob HK, mod LVH, mildly reduced RV fxn, mild MR, mod-sev MV Ca2+. Mild AS; b. 10/2023 Echo: EF 45-50%, glob HK, GrII DD, mildly reduced RV fxn, RVSP 52.64mmHg. Mild MR/MS, mod TR.   Hypertension    Nonischemic cardiomyopathy (HCC)    a. 2019 Cath [facility]): Nonobstructive disease; b. 12/2019 Echo: EF 50%; c. 12/2019 MV (Tx w/u [facility]): Ant infarct w/ peri-infarct ischemia. EF 35%; c. 05/2021 MV (Tx w/u [facility]): Cor Ca2+, EF 53%, motion artifact, equiv study, likely low risk.   Nonobstructive coronary artery disease    a.  2019 Cath ([place]): Left main normal, LAD 30 proximal, RCA 30 distal, EF 25%.   Obesity    Type 2 diabetes mellitus (HCC)    a. 10/2023 - admission w/ HHS.    Past Surgical History:  Procedure Laterality Date   A/V FISTULAGRAM N/A 08/15/2019   Procedure: A/V FISTULAGRAM;  Surgeon: Maeola Harman, MD;  Location: [facility] INVASIVE CV LAB;  Service: Cardiovascular;  Laterality: N/A;   AMPUTATION Right 02/02/2022   Procedure: AMPUTATION 5th RAY;  Surgeon: Louann Sjogren, MD;  Location: [facility] OR;  Service: Podiatry;  Laterality: Right;   AMPUTATION Right 05/23/2022   Procedure: RIGHT BELOW KNEE AMPUTATION;  Surgeon: Nadara Mustard, MD;  Location: [facility] OR;  Service: Orthopedics;  Laterality: Right;   AMPUTATION Right 07/04/2022   Procedure: REVISION AMPUTATION BELOW KNEE;  Surgeon: Nadara Mustard, MD;  Location: [facility] OR;  Service: Orthopedics;  Laterality: Right;   APPLICATION OF WOUND VAC  05/23/2022   Procedure: APPLICATION OF WOUND VAC;  Surgeon: Nadara Mustard, MD;  Location: [facility] OR;  Service: Orthopedics;;   AV FISTULA PLACEMENT     CARDIAC CATHETERIZATION  02/17/2018   LHC 02/17/18 ([name]): 30% pLAD, 30% dRCA, LVEDP 25-30, LVEF 25%.     CATARACT EXTRACTION W/PHACO Right 03/29/2020   Procedure: CATARACT EXTRACTION PHACO AND INTRAOCULAR LENS PLACEMENT (IOC)  RIGHT DIABETIC;  Surgeon: Lockie Mola,  MD;  Location: [facility] ORS;  Service: Ophthalmology;  Laterality: Right;  Lot # [id number] H [place]: 02:30.9 AP% 9.5% CDE: 14.38   EYE SURGERY Right    cataract removed   I & D EXTREMITY Right 02/02/2022   Procedure: IRRIGATION AND DEBRIDEMENT RIGHT FOOT;  Surgeon: Louann Sjogren, MD;  Location: [facility] OR;  Service: Podiatry;  Laterality: Right;   INCISION AND DRAINAGE OF WOUND Right 02/08/2022   Procedure: IRRIGATION AND DEBRIDEMENT WOUND;  Surgeon: Asencion Islam, DPM;  Location: [facility] OR;  Service: Podiatry;  Laterality: Right;  with removal of infected bone    INSERTION OF DIALYSIS CATHETER  08/13/2019   Procedure: Insertion Of Dialysis Catheter;  Surgeon: Larina Earthly, MD;  Location: [facility] OR;  Service: Vascular;;   INSERTION OF DIALYSIS CATHETER Left 07/19/2020   Procedure: INSERTION OF DIALYSIS CATHETER;  Surgeon: Maeola Harman, MD;  Location: [facility] OR;  Service: Vascular;  Laterality: Left;   IR US GUIDE BX ASP/DRAIN  11/07/2022   IRRIGATION AND DEBRIDEMENT ABSCESS     PERIPHERAL VASCULAR BALLOON ANGIOPLASTY  08/15/2019   Procedure: PERIPHERAL VASCULAR BALLOON ANGIOPLASTY;  Surgeon: Maeola Harman, MD;  Location: [facility] INVASIVE CV LAB;  Service: Cardiovascular;;   PERIPHERAL VASCULAR BALLOON ANGIOPLASTY Left 02/06/2022   Procedure: PERIPHERAL VASCULAR BALLOON ANGIOPLASTY;  Surgeon: Cephus Shelling, MD;  Location: [facility] INVASIVE CV LAB;  Service: Cardiovascular;  Laterality: Left;   REVISON OF ARTERIOVENOUS FISTULA Left 07/19/2020   Procedure: REVISON/PLICATION OF ARTERIOVENOUS FISTULA LEFT;  Surgeon: Maeola Harman, MD;  Location: [facility] OR;  Service: Vascular;  Laterality: Left;   THROMBECTOMY AND REVISION OF ARTERIOVENTOUS (AV) GORETEX  GRAFT Left 08/13/2019   Procedure: THROMBECTOMY OF LEFT ARM ARTERIOVENTOUS (AV) FISTULA;  Surgeon: Larina Earthly, MD;  Location: [facility] OR;  Service: Vascular;  Laterality: Left;   TOE AMPUTATION      Family History: Family History  Problem Relation  Age of Onset   Hypertension Mother     Social History  reports that he has never smoked. He has never used smokeless tobacco. He reports that he does not currently use alcohol. He reports that he does not use drugs.  Allergies  Allergen Reactions   Sulfa Antibiotics Diarrhea and Nausea And Vomiting    Medications   Current Facility-Administered Medications:    acetaminophen (TYLENOL) tablet 650 mg, 650 mg, Oral, Q6H PRN, 650 mg at 11/12/23 0400 **OR** acetaminophen (TYLENOL) suppository 650 mg, 650 mg, Rectal, Q6H PRN, Arrien, York Ram, MD, 650 mg at 11/10/23 2045   aspirin chewable tablet 81 mg, 81 mg, Oral, Daily, Arrien, York Ram, MD, 81 mg at 11/12/23 0847   atorvastatin (LIPITOR) tablet 40 mg, 40 mg, Oral, Daily, Arrien, York Ram, MD, 40 mg at 11/12/23 0845   Chlorhexidine Gluconate Cloth 2 % PADS 6 each, 6 each, Topical, Daily, Arrien, York Ram, MD, 6 each at 11/12/23 0848   cinacalcet (SENSIPAR) tablet 30 mg, 30 mg, Oral, Q breakfast, Arrien, York Ram, MD, 30 mg at 11/12/23 1610   doxercalciferol (HECTOROL) injection 6 mcg, 6 mcg, Intravenous, Q M,W,F-HD, Pola Corn, NP, 6 mcg at 11/10/23 0810   heparin ADULT  infusion 100 units/mL (25000 units/239mL), 1,350 Units/hr, Intravenous, Continuous, Weingarten, Rachael I, RPH, Last Rate: 13.5 mL/hr at 11/12/23 0613, 1,350 Units/hr at 11/12/23 4098   insulin aspart (novoLOG) injection 0-6 Units, 0-6 Units, Subcutaneous, TID WC, Arrien, York Ram, MD, 1 Units at 11/12/23 0849   midodrine (PROAMATINE) tablet 10 mg, 10 mg, Oral, TID WC, Pola Corn, NP, 10 mg at 11/12/23 0846   mupirocin ointment (BACTROBAN) 2 % 1 Application, 1 Application, Nasal, BID, Arrien, York Ram, MD, 1 Application at 11/12/23 0848   ondansetron (ZOFRAN) tablet 4 mg, 4 mg, Oral, Q6H PRN **OR** ondansetron (ZOFRAN) injection 4 mg, 4 mg, Intravenous, Q6H PRN, Arrien, York Ram, MD   Oral care mouth  rinse, 15 mL, Mouth Rinse, PRN, Arrien, York Ram, MD   piperacillin-tazobactam (ZOSYN) IVPB 2.25 g, 2.25 g, Intravenous, Q8H, Rollene Fare, RPH, Stopped at 11/12/23 0433   sevelamer carbonate (RENVELA) tablet 2,400 mg, 2,400 mg, Oral, BID PRN, Arrien, York Ram, MD   sevelamer carbonate (RENVELA) tablet 4,000 mg, 4,000 mg, Oral, TID WC, Arrien, York Ram, MD, 4,000 mg at 11/12/23 0846   sodium zirconium cyclosilicate (LOKELMA) packet 10 g, 10 g, Oral, Daily, Pola Corn, NP, 10 g at 11/12/23 0846   vancomycin (VANCOCIN) IVPB 1000 mg/200 mL premix, 1,000 mg, Intravenous, Q M,W,F-HD, Silvana Newness, RPH  Vitals   Vitals:   11/12/23 0510 11/12/23 0610 11/12/23 0814 11/12/23 1009  BP: (!) 89/49 (!) 93/48 (!) 97/57   Pulse: 93 86 82   Resp: 20 (!) 23 19   Temp: (!) 102 F (38.9 C)     TempSrc:      SpO2: 97% 95% 95%   Weight:    104.1 kg  Height:        Body mass index is 30.28 kg/m.  Physical Exam   Physical Exam  HEENT:  [place]/AT. Neck is supple on lateral rotation to left and right. There is mild stiffness on flexion past about 45 degrees, from the horizontal, but neck is supple to flexion at < 45 degrees.  Lungs: Respirations unlabored Extremities: Warm and well perfused. Right BKA.   Neurological Examination Mental Status: Somnolent. Initially unarousable, he did wake up and open eyes after about 90 seconds of sustained light irritative sternal rub and loud calling of his name. He is able to count fingers and name examiner's thumb and beard, but gives hypophonic and dysarthric incoherent replies when asked to name his nose and ear. Not able to answer any orientation questions. Not following motor commands except to stick out his tongue. All verbal responses are no more than 1-2 words in length. Rapidly falls back asleep when not stimulated.  Cranial Nerves: II: PERRL. Will identify objects by sight.    III,IV, VI: Eyes are conjugate and near the  midline. Does not follow commands for testing of EOM. No nystagmus.  V: Reacts to eyelid stimulation bilaterally VII: Grimace is symmetric VIII: Hearing intact to loud questions IX,X: Gag reflex deferred XI: Head is midline XII: Midline tongue extension Motor: Normal tone x 4 without asymmetry.  Elevates BUE antigravity and holds them up > 10 seconds without drift. Does not follow commands for formal motor testing. Does not comply with lower extremity motor commands, but moves slightly to pinch. Has right BKA.  Sensory: Intact to pinch x 4  Deep Tendon Reflexes: 2+ and symmetric bilateral brachioradialis and patellae Cerebellar: Does follow commands for FNF testing bilaterally, which is significantly bradykinetic, and with optic  ataxia manifesting as difficulty moving his finger to target bilaterally.  Gait: Deferred   Labs/Imaging/Neurodiagnostic studies   CBC:  Recent Labs  Lab Dec 04, 2023 1658 11/09/23 0328 11/10/23 1503 11/11/23 0407 11/12/23 0921  WBC 6.3   < > 9.6 10.7* 9.3  NEUTROABS 3.7  --  6.6  --   --   HGB 14.4   < > 15.3 14.7 14.4  HCT 44.6   < > 47.4 46.3 45.2  MCV 91.6   < > 89.4 91.3 89.7  PLT 176   < > 179 149* 155   < > = values in this interval not displayed.   Basic Metabolic Panel:  Lab Results  Component Value Date   NA 132 (L) 11/12/2023   K 4.6 11/12/2023   CO2 21 (L) 11/12/2023   GLUCOSE 214 (H) 11/12/2023   BUN 35 (H) 11/12/2023   CREATININE 8.39 (H) 11/12/2023   CALCIUM 9.6 11/12/2023   GFRNONAA 7 (L) 11/12/2023   GFRAA 3 (L) 08/14/2019   Lipid Panel:  Lab Results  Component Value Date   LDLCALC 46 02/05/2022   HgbA1c:  Lab Results  Component Value Date   HGBA1C >15.5 (H) 10/24/2023   Urine Drug Screen: No results found for: "LABOPIA", "COCAINSCRNUR", "LABBENZ", "AMPHETMU", "THCU", "LABBARB"  Alcohol Level     Component Value Date/Time   ETH <10 02/02/2022 0152   INR  Lab Results  Component Value Date   INR 1.2 12/04/2023    APTT  Lab Results  Component Value Date   APTT 28 2023-12-04     ASSESSMENT  62 year old male with a PMHx of ESRD on dialysis TTS, chronic HFmrEF, insulin-dependent diabetes, hypertension, PVD status post right BKA, history of stroke, hypergammaglobulinemia, hyperferritinemia, recent prior admissions for hyperglycemia with CBG > 600 in the setting of noncompliance with home insulin, followed by a subsequent admission for a fall and recurrent hyperglycemia with blood glucose of 472, QT prolongation, recent T wave inversions on EKG, who re-presented to the ED via EMS on 2023/12/04 for severe hypotension after being called for unremitting coughing with near-syncope and SOB while at dialysis. Troponin was markedly elevated at 7689. Presentation was suspicious for NSTEMI. Cardiology was consulted and he was started on a heparin drip in conjunction with IVF and ASA. Yesterday (12/25) he was noted to be significantly worse in terms of cognition. After receiving dialysis overnight he is somewhat improved, being able to open eyes and answer with one-word replies, but still somnolent.  - Exam reveals a somnolent patient with normal limb tone and intact sensation to pinch x 4. No seizure like activity or nuchal rigidity.  - Imaging: - CT head: No acute intracranial process, with chronic lacunar infarcts noted.  - MRI brain (personally reviewed): Symmetrical restricted diffusion straddling the midline within the splenium of the corpus callosum, with subtle corresponding FLAIR hyperintensity. 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 Various etiologies can lead to this appearance including seizures, metabolic disturbance, certain medications or infection. No other acute intracranial abnormality. Stable atrophy and white matter disease, likely reflecting the sequela of chronic microvascular ischemia. Remote lacunar infarcts of the basal ganglia and thalami bilaterally.  Left mastoid effusion.  - Febrile with temperatures ranging from 101 - 102.2 overnight. Blood pressures are low, ranging from 62/28 - 93/48 over the past 6 hours, trending downwards since yesterday night (was 127/61 between 7 and 8 PM yesterday) - No leukocytosis on CBC - Due to  suspected NSTEMI, Cardiology is considering cardiac catheterization for evaluation of coronary anatomy and further assessment of aortic stenosis, presuming mental status clears and he is able to consent. - DDx: - Overall presentation is most consistent with a septic encephalopathy. Moderate rather than high suspicion for meningitis due to no nuchal rigidity, but in the context of AMS, leukocytosis and fever, meningitis is certainly a consideration. Cannot perform LP due to heparin drip, which he is on for suspected NSTEMI. Will need empiric meningitis-dose ABX including ampicillin as he is debilitated. Also will need acyclovir.  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 Usually these lesions are reversible if they are symmetric and without mass effect, as is the case with the current MRI.  Marland Kitchen RECOMMENDATIONS  - Pharmacy has been consulted for dosing of meningitis-dose ampicillin, vancomycin, ceftriaxone and acyclovir - Pharmacy is contacting Hospitalist to determine if Zosyn for sepsis can be discontinued or modified given the above ABX regimen.  - Neurology will continue to follow ______________________________________________________________________    Dessa Phi, Nikhita Mentzel, MD [facility]

## 2023-11-12 NOTE — Progress Notes (Signed)
Pharmacy Antibiotic Note  [name] [name]. is a 62 y.o. male with ESRD on HD admitted on 11/08/2023 with sepsis.  Pharmacy has been consulted for vancomycin, ceftriaxone, ampicillin, and acyclovir dosing for possible meningitis. Last dose of Zosyn 12:00, vancomycin 10:53 today. Next HD due 12/27. He is febrile with Tm 102.2, WBC are down to normal. BCx are ngtd thus far.  Plan: Discontinue Zosyn  Continue vancomycin 1000 mg after HD (goal pre-HD level 15-25 mcg/ml) Ceftriaxone 2 g IV q12h Ampicillin 2 g IV q12h Acyclovir 450 mg IV q24h (5 mg/kg ABW for BMI >30) Monitor HD schedule, clinical progress, cultures/sensitivities F/U LOT and de-escalate as able Vancomycin levels as clinically indicated    Height: 6\' 1"  (185.4 cm) Weight: 104.1 kg (229 lb 8 oz) IBW/kg (Calculated) : 79.9  Temp (24hrs), Avg:100.5 F (38.1 C), Min:98.4 F (36.9 C), Max:102.2 F (39 C)  Recent Labs  Lab 11/09/23 1945 11/10/23 0359 11/10/23 1503 11/11/23 0001 11/11/23 0407 11/11/23 1647 11/12/23 0850 11/12/23 0921  WBC 8.1 8.1 9.6  --  10.7*  --   --  9.3  CREATININE 10.49* 11.47* 8.80*  --  10.09* 11.53* 8.39*  --   LATICACIDVEN  --   --   --  2.1* 2.1*  --   --   --     Estimated Creatinine Clearance: 11.7 mL/min (A) (by C-G formula based on SCr of 8.39 mg/dL (H)).    Allergies  Allergen Reactions   Sulfa Antibiotics Diarrhea and Nausea And Vomiting    Antimicrobials this admission: Vancomycin 12/24 >>  Cefepime 12/24 >> 12/25 Metronidazole 12/25 >> 12/25 Zosyn 12/25 >> 12/26 Ceftriaxone 12/26 >> Ampicillin 12/26 >> Acyclovir 12/26 >>  Dose adjustments this admission:  Microbiology results: 12/25 sputum cx: sent  12/24 Bcx: ngtd  12/23 MRSA PCR positive    Thank you for involving pharmacy in this patient's care.  Loura Back, PharmD, BCPS Clinical Pharmacist Clinical phone for 11/12/2023 is [phone] 11/12/2023 4:14 PM

## 2023-11-12 NOTE — Progress Notes (Addendum)
   11/12/23 0120  Vitals  Temp (!) 101.1 F (38.4 C)  Temp Source Axillary  BP (!) 88/40  MAP (mmHg) (!) 55  BP Location Right Arm  BP Method Automatic  Patient Position (if appropriate) Lying  Pulse Rate 94  Pulse Rate Source Monitor  ECG Heart Rate (!) 105  Oxygen Therapy  SpO2 97 %  O2 Device Nasal Cannula  O2 Flow Rate (L/min) 2 L/min  During Treatment Monitoring  Blood Flow Rate (mL/min) 0 mL/min  Arterial Pressure (mmHg) -65.86 mmHg  Venous Pressure (mmHg) 123.23 mmHg  TMP (mmHg) -11.92 mmHg  Ultrafiltration Rate (mL/min) 479 mL/min  Dialysate Flow Rate (mL/min) 300 ml/min  Dialysate Potassium Concentration 2  Dialysate Calcium Concentration 2.5  Duration of HD Treatment -hour(s) 4 hour(s)  Cumulative Fluid Removed (mL) per Treatment  1900.09  HD Safety Checks Performed Yes  Intra-Hemodialysis Comments Tx completed  Post Treatment  Dialyzer Clearance Lightly streaked  Hemodialysis Intake (mL) 450 mL  Liters Processed 84  Fluid Removed (mL) 1450 mL  Tolerated HD Treatment No (Comment)  Post-Hemodialysis Comments  (pt had low blood pressure extra fluid bolus post treatment)  AVG/AVF Arterial Site Held (minutes) 10 minutes  AVG/AVF Venous Site Held (minutes) 10 minutes  Fistula / Graft Left Upper arm Arteriovenous fistula  Placement Date/Time: 09/22/15 0114   Placed prior to admission: Yes  Orientation: (c) Left  Access Location: Upper arm  Access Type: (c) Arteriovenous fistula  Site Condition No complications (not scab and skin errosion at the lower aspect of left upper arm fistula.)   Post treatment, due to low blood pressure 450 ml saline administered via Fistula per Dr. Malen Gauze.Pt also have elevated temp of 101.1 . Post blood pressures 108/59,mp 74 HR 96 & 103/62 HR 96. Noted skin erosion at the base of A/V fistula.

## 2023-11-12 NOTE — Progress Notes (Addendum)
Rounding Note    Patient Name: [name] SWOR Sr. Date of Encounter: 11/12/2023  [facility] Cardiologist: None   Subjective   Opened eye for sternal rub, did not respond to question.   Inpatient Medications    Scheduled Meds:  aspirin  81 mg Oral Daily   atorvastatin  40 mg Oral Daily   Chlorhexidine Gluconate Cloth  6 each Topical Daily   cinacalcet  30 mg Oral Q breakfast   doxercalciferol  6 mcg Intravenous Q M,W,F-HD   insulin aspart  0-6 Units Subcutaneous TID WC   midodrine  10 mg Oral TID WC   mupirocin ointment  1 Application Nasal BID   sevelamer carbonate  4,000 mg Oral TID WC   sodium zirconium cyclosilicate  10 g Oral Daily   Continuous Infusions:  heparin 1,350 Units/hr (11/12/23 2440)   piperacillin-tazobactam (ZOSYN)  IV Stopped (11/12/23 0433)   vancomycin     PRN Meds: acetaminophen **OR** acetaminophen, ondansetron **OR** ondansetron (ZOFRAN) IV, mouth rinse, sevelamer carbonate   Vital Signs    Vitals:   11/12/23 0410 11/12/23 0510 11/12/23 0610 11/12/23 0814  BP: 95/62 (!) 89/49 (!) 93/48 (!) 97/57  Pulse: 94 93 86 82  Resp: 19 20 (!) 23 19  Temp: (!) 102.2 F (39 C) (!) 102 F (38.9 C)    TempSrc: Oral     SpO2: 91% 97% 95% 95%  Weight:      Height:        Intake/Output Summary (Last 24 hours) at 11/12/2023 0901 Last data filed at 11/12/2023 1027 Gross per 24 hour  Intake 609.85 ml  Output 2950 ml  Net -2340.15 ml      11/10/2023   11:57 AM 11/10/2023    7:44 AM 11/10/2023    7:27 AM  Last 3 Weights  Weight (lbs) 234 lb 9.1 oz 245 lb 9.5 oz 245 lb 9.5 oz  Weight (kg) 106.4 kg 111.4 kg 111.4 kg      Telemetry    NSR without significant ventricular ectopy - Personally Reviewed  ECG    NSR, LBBB - Personally Reviewed  Physical Exam   GEN: asleep, only open eye with sternal rub  Neck: No JVD Cardiac: RRR, no rubs, or gallops. 3/6 systolic murmur at RUSB Respiratory: Clear to auscultation bilaterally. GI:  Soft, nontender, non-distended  MS: No edema; R BKA Neuro:  did not respond to question  Psych: somnolent  Labs    High Sensitivity Troponin:   Recent Labs  Lab 11/09/23 1111 11/09/23 1314 11/10/23 0907 11/10/23 1503 11/11/23 0001  TROPONINIHS 8,442* 8,849* 8,942* 7,282* 7,672*     Chemistry Recent Labs  Lab 11/08/23 1658 11/09/23 1945 11/10/23 1503 11/11/23 0407 11/11/23 1647  NA 130*   < > 130* 128* 127*  K 4.5   < > 5.9* 6.1* 6.3*  CL 91*   < > 89* 90* 90*  CO2 26   < > 19* 20* 21*  GLUCOSE 290*   < > 196* 259* 275*  BUN 28*   < > 32* 43* 56*  CREATININE 7.50*   < > 8.80* 10.09* 11.53*  CALCIUM 10.3   < > 9.7 9.3 10.1  PROT 8.3*  --  9.3*  --   --   ALBUMIN 3.3*   < > 3.5 3.0* 3.3*  AST 44*  --  117*  --   --   ALT 34  --  67*  --   --   ALKPHOS  127*  --  135*  --   --   BILITOT 0.6  --  1.0  --   --   GFRNONAA 8*   < > 6* 5* 5*  ANIONGAP 13   < > 22* 18* 16*   < > = values in this interval not displayed.    Lipids No results for input(s): "CHOL", "TRIG", "HDL", "LABVLDL", "LDLCALC", "CHOLHDL" in the last 168 hours.  Hematology Recent Labs  Lab 11/10/23 0359 11/10/23 1503 11/11/23 0407  WBC 8.1 9.6 10.7*  RBC 5.26 5.30 5.07  HGB 15.2 15.3 14.7  HCT 48.3 47.4 46.3  MCV 91.8 89.4 91.3  MCH 28.9 28.9 29.0  MCHC 31.5 32.3 31.7  RDW 15.6* 15.7* 15.7*  PLT 161 179 149*   Thyroid No results for input(s): "TSH", "FREET4" in the last 168 hours.  BNPNo results for input(s): "BNP", "PROBNP" in the last 168 hours.  DDimer No results for input(s): "DDIMER" in the last 168 hours.   Radiology    MR BRAIN WO CONTRAST Result Date: 11/11/2023 CLINICAL DATA:  Mental status change, persistent or worsening. Single episode during hemodialysis. Chest pain and hypotension. EXAM: MRI HEAD WITHOUT CONTRAST TECHNIQUE: Multiplanar, multiecho pulse sequences of the brain and surrounding structures were obtained without intravenous contrast. COMPARISON:  CT head without  contrast 11/09/2023. MR head 02/04/2022 FINDINGS: Brain: Somewhat ill-defined restricted diffusion is present within the splenium of the corpus callosum. No other restricted diffusion is present. Mild atrophy and white matter changes are otherwise stable. Remote lacunar infarcts of the basal ganglia and thalami are again noted bilaterally. The ventricles are proportionate to the degree of atrophy. No significant extraaxial fluid collection is present. The brainstem and cerebellum are within normal limits. The internal auditory canals are within normal limits. Midline structures are within normal limits. Vascular: Flow is present in the major intracranial arteries. Skull and upper cervical spine: The craniocervical junction is normal. Upper cervical spine is within normal limits. Marrow signal is unremarkable. Sinuses/Orbits: A left mastoid effusion is present. A polyp or mucous retention cyst is present within the left maxillary sinus. The paranasal sinuses and mastoid air cells are otherwise clear. Right lens replacement is noted. Globes and orbits are otherwise within normal limits. Other: IMPRESSION: 1. Somewhat ill-defined restricted diffusion within the splenium of the corpus callosum. 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 Various etiologies can lead to this appearance including seizures, metabolic disturbance or infection. Demyelination, infarct or posterior reversible encephalopathy syndrome are considered less likely. 2. No other acute intracranial abnormality. 3. Stable atrophy and white matter disease. This likely reflects the sequela of chronic microvascular ischemia. 4. Remote lacunar infarcts of the basal ganglia and thalami bilaterally. 5. Left mastoid effusion. 6. Polyp or mucous retention cyst of the left maxillary sinus. Electronically Signed   By: Marin Roberts M.D.   On: 11/11/2023 15:01   DG Chest Port 1 View Result Date:  11/10/2023 CLINICAL DATA:  Dyspnea.  Possible sepsis. EXAM: PORTABLE CHEST 1 VIEW COMPARISON:  11/08/2023 FINDINGS: Cardiac enlargement. Mild central vascular congestion. Shallow inspiration. No airspace disease or consolidation in the lungs. No pleural effusions. No pneumothorax. Calcification of the aorta. Degenerative changes in the spine and shoulders. IMPRESSION: Cardiac enlargement with central vascular congestion. No edema or consolidation. Electronically Signed   By: Burman Nieves M.D.   On: 11/10/2023 19:29    Cardiac Studies   Echo 11/09/2023  1. Left ventricular ejection fraction, by estimation,  is 30 to 35%. The  left ventricle has moderately decreased function. The left ventricle  demonstrates regional wall motion abnormalities (see scoring  diagram/findings for description). There is  moderate concentric left ventricular hypertrophy. Left ventricular  diastolic parameters are consistent with Grade III diastolic dysfunction  (restrictive).   2. Right ventricular systolic function is mildly reduced. The right  ventricular size is normal. There is mildly elevated pulmonary artery  systolic pressure. The estimated right ventricular systolic pressure is  42.2 mmHg.   3. Left atrial size was moderately dilated.   4. The mitral valve is degenerative. Trivial mitral valve regurgitation.  Moderate mitral annular calcification.   5. The aortic valve is tricuspid. There is moderate calcification of the  aortic valve. Aortic valve regurgitation is not visualized. Moderate to  severe aortic valve stenosis - suspect upper end of scale, low flow/low  gradient. Aortic valve area, by VTI  measures 0.98 cm. Aortic valve mean gradient measures 22.0 mmHg.  Dimentionless index 0.19.   6. Aortic dilatation noted. There is borderline dilatation of the aortic  root, measuring 40 mm.   7. The inferior vena cava is dilated in size with <50% respiratory  variability, suggesting right atrial  pressure of 15 mmHg.   Comparison(s): Prior images reviewed side by side. LVEF has decreased,  30-35% range with basal inferoposterior akinesis and restrictive diastolic  filling. Aortic stenosis possibly severe (low flow/low gradient). Mildly  elevated RVSP.    Patient Profile     62 y.o. male with PMH of NICM, nonobstructive CAD, HTN, HLD, DM II, PAD s/p R BKA, and ESRD on HD for 10+ years who presented with NSTEMI, HTN and presyncope. On arrival BP 75/59  Assessment & Plan    NSTEMI  - hs trop 7689 --> 7744  - Echo 11/10/2023 showed EF 30-35%, basal inferoposterior akinesis and restrictive diastolic filling, possible severe aortic stenosis with low flow and low gradient grade 3 DD.   - originally planned for left and right heart cath to assess CAD and severe AS, procedure has been delayed by his poor mental status, furthermore patient developed a fever overnight, primary team thinking of potential lumbar puncture as his mental status still has not shown improvement despite dialysis.   CAD: previously underwent cath in [place] due to LV dysfunction with EF of 25%, cath showed 30% stenosis in prox LAD and distal RCA, medically managed. Previously underwent stress test in Feb 2021 as part of transplant eval, this showed anterior infarct with peri-infarct ischemia, finding was suspicious for artifact. No repeat cath. Echo in March 2023 showed EF 45-50% with global hypokinesis.   HTN: on midodrine  HLD: on lipitor  DM II  ESRD on HD: been on dialysis for 10+ years, followed by [facility] nephrology. Urgent dialysis 11/11/2023 for hyperkalemia. Next HD 12/27/204      For questions or updates, please contact [facility] Please consult [email or web address] for contact info under        Signed, Azalee Course, PA  11/12/2023, 9:01 AM

## 2023-11-12 NOTE — Consult Note (Signed)
PHARMACY - ANTICOAGULATION CONSULT NOTE  Pharmacy Consult for Heparin infusion Indication: chest pain/ACS + AFib  Allergies  Allergen Reactions   Sulfa Antibiotics Diarrhea and Nausea And Vomiting    Patient Measurements: Height: 6\' 1"  (185.4 cm) Weight: 106.4 kg (234 lb 9.1 oz) (Taken via bed.) IBW/kg (Calculated) : 79.9 Heparin Dosing Weight: 103.9 kg  Vital Signs: Temp: 102 F (38.9 C) (12/26 0510) Temp Source: Oral (12/26 0410) BP: 97/57 (12/26 0814) Pulse Rate: 82 (12/26 0814)  Labs: Recent Labs    11/10/23 0359 11/10/23 0808 11/10/23 0907 11/10/23 1126 11/10/23 1503 11/11/23 0001 11/11/23 0407 11/11/23 0913 11/11/23 1647 11/11/23 2046 11/12/23 0425  HGB 15.2  --   --   --  15.3  --  14.7  --   --   --   --   HCT 48.3  --   --   --  47.4  --  46.3  --   --   --   --   PLT 161  --   --   --  179  --  149*  --   --   --   --   HEPARINUNFRC  --    < >  --    < >  --   --  >1.10* >1.10*  --  0.33 0.48  CREATININE 11.47*  --   --   --  8.80*  --  10.09*  --  11.53*  --   --   TROPONINIHS  --   --  1,610*  --  7,282* 7,672*  --   --   --   --   --    < > = values in this interval not displayed.    Estimated Creatinine Clearance: 8.6 mL/min (A) (by C-G formula based on SCr of 11.53 mg/dL (H)).   Medical History: Past Medical History:  Diagnosis Date   ESRD on hemodialysis (HCC)    a. HD MWF > 10 years.   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 01/2022 Echo: EF 45-50%, glob HK, mod LVH, mildly reduced RV fxn, mild MR, mod-sev MV Ca2+. Mild AS; b. 10/2023 Echo: EF 45-50%, glob HK, GrII DD, mildly reduced RV fxn, RVSP 52.55mmHg. Mild MR/MS, mod TR.   Hypertension    Nonischemic cardiomyopathy (HCC)    a. 2019 Cath [facility]): Nonobstructive disease; b. 12/2019 Echo: EF 50%; c. 12/2019 MV (Tx w/u [facility]): Ant infarct w/ peri-infarct ischemia. EF 35%; c. 05/2021 MV (Tx w/u [facility]): Cor Ca2+, EF 53%, motion artifact, equiv study, likely low risk.    Nonobstructive coronary artery disease    a.  2019 Cath ([place] [facility]): Left main normal, LAD 30 proximal, RCA 30 distal, EF 25%.   Obesity    Type 2 diabetes mellitus (HCC)    a. 10/2023 - admission w/ [facility].     Assessment: 85 yoM ESRD admitted with CP and AFL. Pharmacy to dose IV heparin.  Heparin level 0.48 is therapeutic on 1350 units/hr.  CBC for today is pending.  No issues noted.  Goal of Therapy:  Heparin level 0.3-0.7 units/ml Monitor platelets by anticoagulation protocol: Yes   Plan:  Continue heparin at 1350 units/hr Monitor daily CBC, heparin level and s/sx of bleeding   Thank you for involving pharmacy in the patient's care.   Trixie Rude, PharmD Clinical Pharmacist 11/12/2023  8:53 AM

## 2023-11-12 NOTE — Progress Notes (Signed)
PROGRESS NOTE    [name] Lance Sr.  WUJ:[id number] DOB: Oct 19, 1961 DOA: 11/08/2023 PCP: Pcp, No    Brief Narrative:  62 year old with extensive medical issues, ESRD on hemodialysis, type 2 diabetes, right BKA apparently had a near syncopal episode during hemodialysis so sent to the emergency room.  He was also noticing chest pain and hypotension.  In the emergency room patient was hypotensive with initial blood pressure 75/59 but improved to 106/72.  He was found to have elevated troponins and cardiology was consulted.  Started on heparin infusion. Remains in the hospital, lethargic with altered mentation. Remains very sick.  Persistently spiking fever, no meaningful awakening despite back-to-back hemodialysis and correction of potassium.  Subjective:  Patient seen and examined.  Received hemodialysis overnight.  He had slight drop in blood pressure during dialysis however pressures remain fairly stable since then. Still very lethargic and difficult to arouse.  On room air.  Temperature 102 overnight.  Already on vancomycin and Zosyn.  No positive cultures yet.  Called and discussed with neurology, may need spinal tap or empirically treat for meningitis/encephalitis. MRI was unrevealing.   Assessment & Plan:   Non-STEMI: Unable to explain any chest pain.  Troponins markedly elevated.  Remains on aspirin, atorvastatin and IV heparin.  Followed by cardiology.  May need cardiac cath once he stabilizes however currently dealing with encephalopathy and fever.  Acute metabolic encephalopathy: Possible sepsis, hypotension.  CT head was essentially normal.  With persistent encephalopathy, MRI of the brain was done that did not show any evidence of demyelination or infarction.  It did show some ill-defined lesion on the splenium of corpus callosum.  Possibly edema. Continue supportive care. Treating for sepsis as above.  ESRD on hemodialysis, hyperkalemia:  Had subsequent hemodialysis with  improvement of potassium.  Mental status has not improved.    Chronic systolic heart failure: Still fluid overloaded on clinical exam.  Removing with hemodialysis.  Sepsis, present on admission: Presented with tachycardia, fever, tachypnea and altered mental status.  Chest x-ray was normal.  CBC without leukocytosis.  Blood cultures negative so far. Initiated vancomycin and cefepime, cefepime changed to Zosyn due to mentation.  Continues to have fever with no identifiable source of infection.  Requested neurology for consultation and consideration for lumbar puncture.  Will hold off on further antibiotics until seen by neurology.  Type 2 diabetes, blood sugars stable.  On low-dose insulin due to inadequate oral intake.  Add low-dose long-acting insulin.   DVT prophylaxis: SCDs Start: 11/09/23 1558   Code Status: Full code Family Communication: Called patient's son, unable to reach.  Called patient's brother, unable to reach. Disposition Plan: Status is: Inpatient Remains inpatient appropriate because: Severe systemic illness, sepsis     Consultants:  Cardiology Nephrology Neurology  Procedures:  Routine hemodialysis  Antimicrobials:  Vancomycin 12/23--- Cefepime 12/23-12/25 Zosyn 12/25---     Objective: Vitals:   11/12/23 0510 11/12/23 0610 11/12/23 0814 11/12/23 1009  BP: (!) 89/49 (!) 93/48 (!) 97/57   Pulse: 93 86 82   Resp: 20 (!) 23 19   Temp: (!) 102 F (38.9 C)  98.4 F (36.9 C)   TempSrc:   Oral   SpO2: 97% 95% 95%   Weight:    104.1 kg  Height:        Intake/Output Summary (Last 24 hours) at 11/12/2023 1131 Last data filed at 11/12/2023 9562 Gross per 24 hour  Intake 609.85 ml  Output 2950 ml  Net -2340.15 ml   Ceasar Mons  Weights   11/10/23 0744 11/10/23 1157 11/12/23 1009  Weight: 111.4 kg 106.4 kg 104.1 kg    Examination:  General exam: Sick looking.  Lethargic.  Responds to noxious stimuli.  No focal deficits.  Moves all extremities.   Difficult to keep awake. Does not have any evidence of neck stiffness or meningism. Respiratory system: Clear to auscultation.  Poor inspiratory efforts. Cardiovascular system: S1 & S2 heard, RRR.  Gastrointestinal system: Soft.  Nontender. Right BKA stump clean and dry.  Left upper extremity AV fistula present. Rectal tube with plenty of dark loose stool, will check for C. difficile.   Data Reviewed: I have personally reviewed following labs and imaging studies  CBC: Recent Labs  Lab 11/08/23 1658 11/09/23 0328 11/09/23 1945 11/10/23 0359 11/10/23 1503 11/11/23 0407 11/12/23 0921  WBC 6.3   < > 8.1 8.1 9.6 10.7* 9.3  NEUTROABS 3.7  --   --   --  6.6  --   --   HGB 14.4   < > 15.1 15.2 15.3 14.7 14.4  HCT 44.6   < > 47.9 48.3 47.4 46.3 45.2  MCV 91.6   < > 91.9 91.8 89.4 91.3 89.7  PLT 176   < > 173 161 179 149* 155   < > = values in this interval not displayed.   Basic Metabolic Panel: Recent Labs  Lab 11/10/23 0359 11/10/23 1503 11/11/23 0407 11/11/23 1647 11/12/23 0850  NA 129* 130* 128* 127* 132*  K >7.5* 5.9* 6.1* 6.3* 4.6  CL 90* 89* 90* 90* 93*  CO2 21* 19* 20* 21* 21*  GLUCOSE 262* 196* 259* 275* 214*  BUN 43* 32* 43* 56* 35*  CREATININE 11.47* 8.80* 10.09* 11.53* 8.39*  CALCIUM 9.9 9.7 9.3 10.1 9.6  PHOS 6.3*  --  6.5* 7.2* 6.0*   GFR: Estimated Creatinine Clearance: 11.7 mL/min (A) (by C-G formula based on SCr of 8.39 mg/dL (H)). Liver Function Tests: Recent Labs  Lab 11/08/23 1658 11/10/23 0359 11/10/23 1503 11/11/23 0407 11/11/23 1647 11/12/23 0850  AST 44*  --  117*  --   --   --   ALT 34  --  67*  --   --   --   ALKPHOS 127*  --  135*  --   --   --   BILITOT 0.6  --  1.0  --   --   --   PROT 8.3*  --  9.3*  --   --   --   ALBUMIN 3.3* 3.4* 3.5 3.0* 3.3* 3.1*   No results for input(s): "LIPASE", "AMYLASE" in the last 168 hours. No results for input(s): "AMMONIA" in the last 168 hours. Coagulation Profile: Recent Labs  Lab  11/08/23 1830  INR 1.2   Cardiac Enzymes: No results for input(s): "CKTOTAL", "CKMB", "CKMBINDEX", "TROPONINI" in the last 168 hours. BNP (last 3 results) No results for input(s): "PROBNP" in the last 8760 hours. HbA1C: No results for input(s): "HGBA1C" in the last 72 hours. CBG: Recent Labs  Lab 11/11/23 0803 11/11/23 1126 11/11/23 1617 11/12/23 0023 11/12/23 0739  GLUCAP 250* 275* 270* 195* 211*   Lipid Profile: No results for input(s): "CHOL", "HDL", "LDLCALC", "TRIG", "CHOLHDL", "LDLDIRECT" in the last 72 hours. Thyroid Function Tests: No results for input(s): "TSH", "T4TOTAL", "FREET4", "T3FREE", "THYROIDAB" in the last 72 hours. Anemia Panel: No results for input(s): "VITAMINB12", "FOLATE", "FERRITIN", "TIBC", "IRON", "RETICCTPCT" in the last 72 hours. Sepsis Labs: Recent Labs  Lab 11/11/23 0001 11/11/23 0407  LATICACIDVEN 2.1* 2.1*    Recent Results (from the past 240 hours)  MRSA Next Gen by PCR, Nasal     Status: Abnormal   Collection Time: 11/09/23  4:15 PM   Specimen: Nasal Mucosa; Nasal Swab  Result Value Ref Range Status   MRSA by PCR Next Gen DETECTED (A) NOT DETECTED Final    Comment: RESULT CALLED TO, READ BACK BY AND VERIFIED WITH: C HALL,RN@2358  11/09/23 MK (NOTE) The GeneXpert MRSA Assay (FDA approved for NASAL specimens only), is one component of a comprehensive MRSA colonization surveillance program. It is not intended to diagnose MRSA infection nor to guide or monitor treatment for MRSA infections. Test performance is not FDA approved in patients less than 61 years old. Performed at [facility], [address] [place], [place] [address]   Culture, blood (Routine X 2) w Reflex to ID Panel     Status: None (Preliminary result)   Collection Time: 11/10/23  3:03 PM   Specimen: BLOOD RIGHT ARM  Result Value Ref Range Status   Specimen Description BLOOD RIGHT ARM  Final   Special Requests   Final    BOTTLES DRAWN AEROBIC AND ANAEROBIC  Blood Culture results may not be optimal due to an inadequate volume of blood received in culture bottles   Culture   Final    NO GROWTH 2 DAYS Performed at [facility], [address] [place], [place] [address]    Report Status PENDING  Incomplete  Culture, blood (Routine X 2) w Reflex to ID Panel     Status: None (Preliminary result)   Collection Time: 11/10/23  3:08 PM   Specimen: BLOOD RIGHT ARM  Result Value Ref Range Status   Specimen Description BLOOD RIGHT ARM  Final   Special Requests   Final    BOTTLES DRAWN AEROBIC ONLY Blood Culture results may not be optimal due to an inadequate volume of blood received in culture bottles   Culture   Final    NO GROWTH 2 DAYS Performed at [facility], [address], [place], [place] [address]    Report Status PENDING  Incomplete  C Difficile Quick Screen w PCR reflex     Status: None   Collection Time: 11/12/23  8:37 AM   Specimen: STOOL  Result Value Ref Range Status   C Diff antigen NEGATIVE NEGATIVE Final   C Diff toxin NEGATIVE NEGATIVE Final   C Diff interpretation No C. difficile detected.  Final    Comment: Performed at [facility], [address] [place], [place] [address]         Radiology Studies: MR BRAIN WO CONTRAST Result Date: 11/11/2023 CLINICAL DATA:  Mental status change, persistent or worsening. Single episode during hemodialysis. Chest pain and hypotension. EXAM: MRI HEAD WITHOUT CONTRAST TECHNIQUE: Multiplanar, multiecho pulse sequences of the brain and surrounding structures were obtained without intravenous contrast. COMPARISON:  CT head without contrast 11/09/2023. MR head 02/04/2022 FINDINGS: Brain: Somewhat ill-defined restricted diffusion is present within the splenium of the corpus callosum. No other restricted diffusion is present. Mild atrophy and white matter changes are otherwise stable. Remote lacunar infarcts of the basal ganglia and thalami are again noted bilaterally. The  ventricles are proportionate to the degree of atrophy. No significant extraaxial fluid collection is present. The brainstem and cerebellum are within normal limits. The internal auditory canals are within normal limits. Midline structures are within normal limits. Vascular: Flow is present in the  major intracranial arteries. Skull and upper cervical spine: The craniocervical junction is normal. Upper cervical spine is within normal limits. Marrow signal is unremarkable. Sinuses/Orbits: A left mastoid effusion is present. A polyp or mucous retention cyst is present within the left maxillary sinus. The paranasal sinuses and mastoid air cells are otherwise clear. Right lens replacement is noted. Globes and orbits are otherwise within normal limits. Other: IMPRESSION: 1. Somewhat ill-defined restricted diffusion within the splenium of the corpus callosum. 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 Various etiologies can lead to this appearance including seizures, metabolic disturbance or infection. Demyelination, infarct or posterior reversible encephalopathy syndrome are considered less likely. 2. No other acute intracranial abnormality. 3. Stable atrophy and white matter disease. This likely reflects the sequela of chronic microvascular ischemia. 4. Remote lacunar infarcts of the basal ganglia and thalami bilaterally. 5. Left mastoid effusion. 6. Polyp or mucous retention cyst of the left maxillary sinus. Electronically Signed   By: Marin Roberts M.D.   On: 11/11/2023 15:01   DG Chest Port 1 View Result Date: 11/10/2023 CLINICAL DATA:  Dyspnea.  Possible sepsis. EXAM: PORTABLE CHEST 1 VIEW COMPARISON:  11/08/2023 FINDINGS: Cardiac enlargement. Mild central vascular congestion. Shallow inspiration. No airspace disease or consolidation in the lungs. No pleural effusions. No pneumothorax. Calcification of the aorta. Degenerative changes in the spine and shoulders.  IMPRESSION: Cardiac enlargement with central vascular congestion. No edema or consolidation. Electronically Signed   By: Burman Nieves M.D.   On: 11/10/2023 19:29        Scheduled Meds:  aspirin  81 mg Oral Daily   atorvastatin  40 mg Oral Daily   Chlorhexidine Gluconate Cloth  6 each Topical Daily   cinacalcet  30 mg Oral Q breakfast   doxercalciferol  6 mcg Intravenous Q M,W,F-HD   insulin aspart  0-6 Units Subcutaneous TID WC   insulin glargine-yfgn  8 Units Subcutaneous Daily   midodrine  10 mg Oral TID WC   mupirocin ointment  1 Application Nasal BID   sevelamer carbonate  4,000 mg Oral TID WC   sodium zirconium cyclosilicate  10 g Oral Daily   Continuous Infusions:  heparin 1,350 Units/hr (11/12/23 1029)   piperacillin-tazobactam (ZOSYN)  IV Stopped (11/12/23 0433)   vancomycin 1,000 mg (11/12/23 1053)     LOS: 4 days    Time spent: 35 minutes    Dorcas Carrow, MD [facility]

## 2023-11-12 NOTE — TOC Initial Note (Signed)
Transition of Care (TOC) - Initial/Assessment Note    Patient Details  Name: [name] [name] Sr. MRN: [id number] Date of Birth: 1961/08/16  Transition of Care [facility]) CM/SW Contact:    Lockie Pares, RN Phone Number: 11/12/2023, 5:17 PM  Clinical Narrative:                  Patient presented with cough and near syncopal event during dialysis.  He has a BKA, DM and ESRD.He has since become more lethargic and Neurology has been consulted. He has fevers and was started on IV antibiotics. He is followed by [facility] home health (active in [place]). He does not have a listed PCP He is a readmission high readmit score, NSTEMI, on heparin, cards deciding on cath . Spiking fevers and is having AMS, neurology consulted. Disposition is likely SNF.for reconditioning,  TOC will follow for needs, recommendations, and transitions of care   Barriers to Discharge: Continued Medical Work up   Patient Goals and CMS Choice            Expected Discharge Plan and Services       Living arrangements for the past 2 months: Single Family Home                           HH Arranged:  (Active with [facility])          Prior Living Arrangements/Services Living arrangements for the past 2 months: Single Family Home Lives with:: Self, Siblings Patient language and need for interpreter reviewed:: Yes        Need for Family Participation in Patient Care: Yes (Comment) Care giver support system in place?: Yes (comment) Current home services: DME Criminal Activity/Legal Involvement Pertinent to Current Situation/Hospitalization: No - Comment as needed  Activities of Daily Living   ADL Screening (condition at time of admission) Is the patient deaf or have difficulty hearing?: Yes Does the patient have difficulty seeing, even when wearing glasses/contacts?: Yes  Permission Sought/Granted      Share Information with NAME: Gayle Lefrancois     Permission granted to share info w Relationship:  Brother  Permission granted to share info w Contact Information: [phone]  Emotional Assessment       Orientation: : Fluctuating Orientation (Suspected and/or reported Sundowners) Alcohol / Substance Use: Not Applicable Psych Involvement: No (comment)  Admission diagnosis:  NSTEMI (non-ST elevated myocardial infarction) [facility]) [I21.4] Patient Active Problem List   Diagnosis Date Noted   Atrial flutter with rapid ventricular response (HCC) 11/10/2023   NSTEMI (non-ST elevated myocardial infarction) (HCC) 11/08/2023   Hyperglycemia 10/24/2023   Abnormal EKG 10/24/2023   RUQ abdominal pain 10/30/2022   Cholelithiasis 10/29/2022   Hyperbilirubinemia 10/29/2022   Volume overload 10/19/2022   CHF (congestive heart failure) (HCC) 10/18/2022   Hyperosmolar hyperglycemic state (HHS) (HCC)    Diarrhea    Dehiscence of amputation stump of right lower extremity (HCC)    Abscess of right lower leg    Pressure injury of skin 07/02/2022   Cellulitis of right leg 07/01/2022   CAD (coronary artery disease) 05/24/2022   Leukocytosis 05/24/2022   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/23/2022   Obesity (BMI 30-39.9) 02/19/2022   Hypotension 02/08/2022   History of CVA  02/05/2022   Carotid stenosis 02/05/2022   Hyperkalemia 02/03/2022   acute on chronic Anemia of chronic renal failure  02/03/2022   Subacute osteomyelitis, right ankle and foot (HCC) 02/02/2022  Acute metabolic encephalopathy 02/02/2022   Sepsis with encephalopathy and septic shock (HCC) 02/02/2022   Protein-calorie malnutrition, mild (HCC) 02/02/2022   GERD (gastroesophageal reflux disease) 02/02/2022   Acute on chronic systolic CHF (congestive heart failure) (HCC) 08/14/2019   LBBB (left bundle branch block) 08/13/2019   Problem with vascular access 08/13/2019   Dialysis AV fistula malfunction (HCC) 08/12/2019   ESRD on dialysis (HCC) 09/20/2018   DM2 (diabetes mellitus, type 2) (HCC) 09/20/2018    HTN (hypertension) 09/20/2018   PCP:  Pcp, No Pharmacy:   [facility] [place], [place] - [address] [place] [address] Phone: [phone] Fax: [phone]  [facility] [place], [place] - [address] [place] [address] Phone: [phone] Fax: [phone]  [facility] [address] [place] [address] Phone: [phone] Fax: [phone]     Social Drivers of Health (SDOH) Social History: SDOH Screenings   Food Insecurity: Patient Unable To Answer (11/10/2023)  Housing: Patient Unable To Answer (11/10/2023)  Transportation Needs: Unknown (11/10/2023)  Utilities: Patient Unable To Answer (11/10/2023)  Alcohol Screen: Low Risk  (02/04/2022)  Depression (PHQ2-9): Low Risk  (08/06/2022)  Tobacco Use: Low Risk  (11/08/2023)   SDOH Interventions:     Readmission Risk Interventions    11/05/2022    3:08 PM  Readmission Risk Prevention Plan  Transportation Screening Complete  Medication Review (RN Care Manager) Complete  PCP or Specialist appointment within 3-5 days of discharge Complete  HRI or Home Care Consult Complete  SW Recovery Care/Counseling Consult Complete  Palliative Care Screening Complete  Skilled Nursing Facility Complete

## 2023-11-12 NOTE — Inpatient Diabetes Management (Signed)
Inpatient Diabetes Program Recommendations  AACE/ADA: New Consensus Statement on Inpatient Glycemic Control (2015)  Target Ranges:  Prepandial:   less than 140 mg/dL      Peak postprandial:   less than 180 mg/dL (1-2 hours)      Critically ill patients:  140 - 180 mg/dL   Lab Results  Component Value Date   GLUCAP 211 (H) 11/12/2023   HGBA1C >15.5 (H) 10/24/2023    Latest Reference Range & Units 11/11/23 08:03 11/11/23 11:26 11/11/23 16:17 11/12/23 00:23 11/12/23 07:39  Glucose-Capillary 70 - 99 mg/dL 782 (H) Novolog 2 units 275 (H) Novolog 3 units 270 (H) Novolog 3 units 195 (H) 211 (H) Novolog 1 units  (H): Data is abnormally high  Diabetes history: DM2 Outpatient Diabetes medications: 70/30 30 units am, 25 units pm Current orders for Inpatient glycemic control: Novolog 0-6 units tid  Inpatient Diabetes Program Recommendations:   Please consider while insulin held: -Semglee 8 units daily Took only orange juice for breakfast this am.  Thank you, Billy Fischer. Aline Wesche, RN, MSN, CDCES  Diabetes Coordinator Inpatient Glycemic Control Team Team Pager [phone] (8am-5pm) 11/12/2023 10:59 AM

## 2023-11-12 NOTE — Plan of Care (Signed)

## 2023-11-13 ENCOUNTER — Inpatient Hospital Stay (HOSPITAL_COMMUNITY): Payer: Medicare Other

## 2023-11-13 DIAGNOSIS — I214 Non-ST elevation (NSTEMI) myocardial infarction: Secondary | ICD-10-CM | POA: Diagnosis not present

## 2023-11-13 DIAGNOSIS — R569 Unspecified convulsions: Secondary | ICD-10-CM | POA: Diagnosis not present

## 2023-11-13 DIAGNOSIS — R4182 Altered mental status, unspecified: Secondary | ICD-10-CM

## 2023-11-13 DIAGNOSIS — E7800 Pure hypercholesterolemia, unspecified: Secondary | ICD-10-CM

## 2023-11-13 DIAGNOSIS — I959 Hypotension, unspecified: Secondary | ICD-10-CM | POA: Diagnosis not present

## 2023-11-13 DIAGNOSIS — I9589 Other hypotension: Secondary | ICD-10-CM

## 2023-11-13 DIAGNOSIS — G9341 Metabolic encephalopathy: Secondary | ICD-10-CM | POA: Diagnosis not present

## 2023-11-13 DIAGNOSIS — A419 Sepsis, unspecified organism: Secondary | ICD-10-CM | POA: Diagnosis not present

## 2023-11-13 LAB — RENAL FUNCTION PANEL
Albumin: 2.9 g/dL — ABNORMAL LOW (ref 3.5–5.0)
Anion gap: 16 — ABNORMAL HIGH (ref 5–15)
BUN: 30 mg/dL — ABNORMAL HIGH (ref 8–23)
CO2: 23 mmol/L (ref 22–32)
Calcium: 9.2 mg/dL (ref 8.9–10.3)
Chloride: 91 mmol/L — ABNORMAL LOW (ref 98–111)
Creatinine, Ser: 7.21 mg/dL — ABNORMAL HIGH (ref 0.61–1.24)
GFR, Estimated: 8 mL/min — ABNORMAL LOW (ref >60)
Glucose, Bld: 258 mg/dL — ABNORMAL HIGH (ref 70–99)
Phosphorus: 4 mg/dL (ref 2.5–4.6)
Potassium: 3.9 mmol/L (ref 3.5–5.1)
Sodium: 130 mmol/L — ABNORMAL LOW (ref 135–145)

## 2023-11-13 LAB — CBC
HCT: 46.7 % (ref 39.0–52.0)
HCT: 45.9 % (ref 39.0–52.0)
Hemoglobin: 15.4 g/dL (ref 13.0–17.0)
Hemoglobin: 15 g/dL (ref 13.0–17.0)
MCH: 29.1 pg (ref 26.0–34.0)
MCH: 29.1 pg (ref 26.0–34.0)
MCHC: 33 g/dL (ref 30.0–36.0)
MCHC: 32.7 g/dL (ref 30.0–36.0)
MCV: 88.1 fL (ref 80.0–100.0)
MCV: 89.1 fL (ref 80.0–100.0)
Platelets: 166 10*3/uL (ref 150–400)
Platelets: 149 10*3/uL — ABNORMAL LOW (ref 150–400)
RBC: 5.3 MIL/uL (ref 4.22–5.81)
RBC: 5.15 MIL/uL (ref 4.22–5.81)
RDW: 15.1 % (ref 11.5–15.5)
RDW: 15 % (ref 11.5–15.5)
WBC: 8.3 10*3/uL (ref 4.0–10.5)
WBC: 7.7 10*3/uL (ref 4.0–10.5)
nRBC: 0.8 % — ABNORMAL HIGH (ref 0.0–0.2)
nRBC: 1 % — ABNORMAL HIGH (ref 0.0–0.2)

## 2023-11-13 LAB — GLUCOSE, CAPILLARY
Glucose-Capillary: 230 mg/dL — ABNORMAL HIGH (ref 70–99)
Glucose-Capillary: 250 mg/dL — ABNORMAL HIGH (ref 70–99)
Glucose-Capillary: 231 mg/dL — ABNORMAL HIGH (ref 70–99)
Glucose-Capillary: 367 mg/dL — ABNORMAL HIGH (ref 70–99)

## 2023-11-13 LAB — LIPID PANEL
Cholesterol: 104 mg/dL (ref 0–200)
HDL: 33 mg/dL — ABNORMAL LOW (ref >40)
LDL Cholesterol: 39 mg/dL (ref 0–99)
Total CHOL/HDL Ratio: 3.2 {ratio}
Triglycerides: 158 mg/dL — ABNORMAL HIGH (ref <150)
VLDL: 32 mg/dL (ref 0–40)

## 2023-11-13 LAB — HEPARIN LEVEL (UNFRACTIONATED): Heparin Unfractionated: 0.61 [IU]/mL (ref 0.30–0.70)

## 2023-11-13 MED ORDER — SODIUM CHLORIDE 0.9 % IV BOLUS
250.0000 mL | Freq: Once | INTRAVENOUS | Status: AC
  Administered 2023-11-13: 250 mL via INTRAVENOUS

## 2023-11-13 MED ORDER — INSULIN GLARGINE-YFGN 100 UNIT/ML ~~LOC~~ SOLN
12.0000 [IU] | Freq: Every day | SUBCUTANEOUS | Status: DC
Start: 2023-11-14 — End: 2023-11-19
  Administered 2023-11-14 – 2023-11-19 (×6): 12 [IU] via SUBCUTANEOUS
  Filled 2023-11-13 (×7): qty 0.12

## 2023-11-13 NOTE — Progress Notes (Signed)
[facility] Progress Note   Subjective: HD last night. Has had serial HD for 3 days. Volume status improved, mental status same or worsened. Neurology consulted. Minimally responsive today.     Objective Vitals:   11/13/23 0410 11/13/23 0431 11/13/23 0440 11/13/23 0447  BP:  (!) 100/48 107/81   Pulse:  93 93   Resp:  20 16   Temp: (!) 100.5 F (38.1 C)   99 F (37.2 C)  TempSrc:      SpO2:  100% 100%   Weight:      Height:       Physical Exam General: Chronically ill appearing male in no acute distress. Head: Normocephalic, atraumatic, sclera non-icteric, mucus membranes are moist Neck: Supple. No JVD Lungs: Bilateral breath sounds decreased in bases R>L CTAB No WOB.  Heart: SR with S1 S2 3/6 systolic M 5th ICS MCL. No R/G.  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 no edema LLE Neuro: Opens eyes to verbal, not following commands or speaking  Dialysis Access: L AVF + T/B  Additional Objective Labs: Basic Metabolic Panel: Recent Labs  Lab 11/11/23 1647 11/12/23 0850 11/13/23 0816  NA 127* 132* 130*  K 6.3* 4.6 3.9  CL 90* 93* 91*  CO2 21* 21* 23  GLUCOSE 275* 214* 258*  BUN 56* 35* 30*  CREATININE 11.53* 8.39* 7.21*  CALCIUM 10.1 9.6 9.2  PHOS 7.2* 6.0* 4.0   Liver Function Tests: Recent Labs  Lab 11/08/23 1658 11/10/23 0359 11/10/23 1503 11/11/23 0407 11/11/23 1647 11/12/23 0850 11/13/23 0816  AST 44*  --  117*  --   --   --   --   ALT 34  --  67*  --   --   --   --   ALKPHOS 127*  --  135*  --   --   --   --   BILITOT 0.6  --  1.0  --   --   --   --   PROT 8.3*  --  9.3*  --   --   --   --   ALBUMIN 3.3*   < > 3.5   < > 3.3* 3.1* 2.9*   < > = values in this interval not displayed.   No results for input(s): "LIPASE", "AMYLASE" in the last 168 hours. CBC: Recent Labs  Lab 11/08/23 1658 11/09/23 0328 11/10/23 1503 11/11/23 0407 11/12/23 0921 11/13/23 0442 11/13/23 0816  WBC 6.3   < > 9.6 10.7* 9.3 8.3 7.7   NEUTROABS 3.7  --  6.6  --   --   --   --   HGB 14.4   < > 15.3 14.7 14.4 15.4 15.0  HCT 44.6   < > 47.4 46.3 45.2 46.7 45.9  MCV 91.6   < > 89.4 91.3 89.7 88.1 89.1  PLT 176   < > 179 149* 155 166 149*   < > = values in this interval not displayed.   Blood Culture    Component Value Date/Time   SDES BLOOD RIGHT ARM 11/10/2023 1508   SPECREQUEST  11/10/2023 1508    BOTTLES DRAWN AEROBIC ONLY Blood Culture results may not be optimal due to an inadequate volume of blood received in culture bottles   CULT  11/10/2023 1508    NO GROWTH 3 DAYS Performed at [facility], [address], [place], [place] [address]    REPTSTATUS PENDING 11/10/2023 [phone]    Cardiac  Enzymes: No results for input(s): "CKTOTAL", "CKMB", "CKMBINDEX", "TROPONINI" in the last 168 hours. CBG: Recent Labs  Lab 11/12/23 0023 11/12/23 0739 11/12/23 1212 11/12/23 1612 11/13/23 0744  GLUCAP 195* 211* 277* 338* 230*   Iron Studies: No results for input(s): "IRON", "TIBC", "TRANSFERRIN", "FERRITIN" in the last 72 hours. @lablastinr3 @ Studies/Results: MR BRAIN WO CONTRAST Result Date: 11/11/2023 CLINICAL DATA:  Mental status change, persistent or worsening. Single episode during hemodialysis. Chest pain and hypotension. EXAM: MRI HEAD WITHOUT CONTRAST TECHNIQUE: Multiplanar, multiecho pulse sequences of the brain and surrounding structures were obtained without intravenous contrast. COMPARISON:  CT head without contrast 11/09/2023. MR head 02/04/2022 FINDINGS: Brain: Somewhat ill-defined restricted diffusion is present within the splenium of the corpus callosum. No other restricted diffusion is present. Mild atrophy and white matter changes are otherwise stable. Remote lacunar infarcts of the basal ganglia and thalami are again noted bilaterally. The ventricles are proportionate to the degree of atrophy. No significant extraaxial fluid collection is present. The brainstem and cerebellum are within normal limits.  The internal auditory canals are within normal limits. Midline structures are within normal limits. Vascular: Flow is present in the major intracranial arteries. Skull and upper cervical spine: The craniocervical junction is normal. Upper cervical spine is within normal limits. Marrow signal is unremarkable. Sinuses/Orbits: A left mastoid effusion is present. A polyp or mucous retention cyst is present within the left maxillary sinus. The paranasal sinuses and mastoid air cells are otherwise clear. Right lens replacement is noted. Globes and orbits are otherwise within normal limits. Other: IMPRESSION: 1. Somewhat ill-defined restricted diffusion within the splenium of the corpus callosum. 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 Various etiologies can lead to this appearance including seizures, metabolic disturbance or infection. Demyelination, infarct or posterior reversible encephalopathy syndrome are considered less likely. 2. No other acute intracranial abnormality. 3. Stable atrophy and white matter disease. This likely reflects the sequela of chronic microvascular ischemia. 4. Remote lacunar infarcts of the basal ganglia and thalami bilaterally. 5. Left mastoid effusion. 6. Polyp or mucous retention cyst of the left maxillary sinus. Electronically Signed   By: Marin Roberts M.D.   On: 11/11/2023 15:01   Medications:  sodium chloride 50 mL/hr at 11/12/23 1855   acyclovir Stopped (11/12/23 1838)   ampicillin (OMNIPEN) IV 2 g (11/13/23 1610)   cefTRIAXone (ROCEPHIN)  IV 2 g (11/13/23 0914)   heparin 1,350 Units/hr (11/13/23 0508)   vancomycin Stopped (11/12/23 1153)    aspirin  81 mg Oral Daily   atorvastatin  40 mg Oral Daily   Chlorhexidine Gluconate Cloth  6 each Topical Daily   cinacalcet  30 mg Oral Q breakfast   doxercalciferol  6 mcg Intravenous Q M,W,F-HD   insulin aspart  0-6 Units Subcutaneous TID WC   insulin glargine-yfgn  8 Units  Subcutaneous Daily   midodrine  10 mg Oral TID WC   mupirocin ointment  1 Application Nasal BID   sevelamer carbonate  4,000 mg Oral TID WC   sodium zirconium cyclosilicate  10 g Oral Daily     Dialysis Orders: Center: [facility] 4 hr 15 min 180NRe 450/700 105 kg 2.0 K/ 2.0 Ca UFP 4 AVF - Heparin 7000 units IV Initial bolus Heparin 2000  units IV mid run - Hectorol 7 mcg IV three times per week     Assessment/Plan:  NSTEMI-work up per cardiology/primary. Needs LHC/RHC but not ordered yet. Peak troponin 8942 11/10/2023  Severe AS-per Cardiology.   Fever/Hypotension- BC pending. Lactic acid 2.1. Has been started on board spectrum ABX per primary   Hyperkalemia- Urgent HD overnight after patient failed to respond to [facility]. Checking labs today.   AMS- Remains minimally responsive. Labs and multiple HD treatments rule out uremia. CT of head unremarkable. Stopped cefepime 11/11/2023. Per primary  HFrEF-attempt to optimize volume with dialysis. ECHO today with EF 30 to 35%. Cards following. Fluid restrictions and daily weights.   ESRD - MWF. Has had HD 12/24, 12/25, 12/26. Next HD 12/27 short treatment then resume MWF schedule 11/16/2023  Anemia  - HGB 14.0-15.2. Follow HGB  Metabolic bone disease -  Last OP labs close to goal. Decrease VDRA D/T Slightly elevated calcium.  Decreased VDRA dose. On high dose Renvela binder.  Nutrition - Renal diet when eating.   DMT2-per primary  Dene Gentry. Lamberto Dinapoli NP-C 11/13/2023, 9:20 AM  [employer] [phone]

## 2023-11-13 NOTE — Procedures (Signed)
Patient Name: [name] [name].  MRN: [id number]  Epilepsy Attending: Charlsie Quest  Referring Physician/Provider: Caryl Pina, MD  Date: 11/13/2023 Duration: 22.35 mins  Patient history: 62yo M with ams getting eeg to evaluate for seizure  Level of alertness: Awake/ lethargic   AEDs during EEG study: None  Technical aspects: This EEG study was done with scalp electrodes positioned according to the 10-20 International system of electrode placement. Electrical activity was reviewed with band pass filter of 1-70Hz , sensitivity of 7 uV/mm, display speed of 84mm/sec with a 60Hz  notched filter applied as appropriate. EEG data were recorded continuously and digitally stored.  Video monitoring was available and reviewed as appropriate.  Description: No clear posterior dominant rhythm was seen. EEG showed continuous generalized predominantly 5 to 7 Hz  theta- slowing admixed with intermittent 2-3Hz  delta slowing, at times with triphasic morphology. Hyperventilation and photic stimulation were not performed.     ABNORMALITY - Continuous slow, generalized  IMPRESSION: This study is suggestive of mild to moderate diffuse encephalopathy.  No seizures or epileptiform discharges were seen throughout the recording.  Lennan Malone Annabelle Harman

## 2023-11-13 NOTE — Progress Notes (Addendum)
Rounding Note    Patient Name: [name] CIPOLLA Sr. Date of Encounter: 11/13/2023  [facility] Cardiologist: None   Subjective   Still having low-grade fevers.  Remains very lethargic.  CT with no acute process and old lacunar infarcts MRI showed possible lesion of the corpus callosum.  Also hypotensive overnight as low as 62 mmHg systolic urology saw yesterday and feels he has septic encephalopathy.  Currently on antibiotics for possible meningitis  More awake this morning and is oriented to place but still pretty lethargic  Inpatient Medications    Scheduled Meds:  aspirin  81 mg Oral Daily   atorvastatin  40 mg Oral Daily   Chlorhexidine Gluconate Cloth  6 each Topical Daily   cinacalcet  30 mg Oral Q breakfast   doxercalciferol  6 mcg Intravenous Q M,W,F-HD   insulin aspart  0-6 Units Subcutaneous TID WC   insulin glargine-yfgn  8 Units Subcutaneous Daily   midodrine  10 mg Oral TID WC   mupirocin ointment  1 Application Nasal BID   sevelamer carbonate  4,000 mg Oral TID WC   sodium zirconium cyclosilicate  10 g Oral Daily   Continuous Infusions:  sodium chloride 50 mL/hr at 11/12/23 1855   acyclovir Stopped (11/12/23 1838)   ampicillin (OMNIPEN) IV 2 g (11/13/23 0204)   cefTRIAXone (ROCEPHIN)  IV 2 g (11/13/23 0218)   heparin 1,350 Units/hr (11/13/23 0508)   vancomycin Stopped (11/12/23 1153)   PRN Meds: acetaminophen **OR** acetaminophen, ondansetron **OR** ondansetron (ZOFRAN) IV, mouth rinse, sevelamer carbonate   Vital Signs    Vitals:   11/13/23 0410 11/13/23 0431 11/13/23 0440 11/13/23 0447  BP:  (!) 100/48 107/81   Pulse:  93 93   Resp:  20 16   Temp: (!) 100.5 F (38.1 C)   99 F (37.2 C)  TempSrc:      SpO2:  100% 100%   Weight:      Height:        Intake/Output Summary (Last 24 hours) at 11/13/2023 0737 Last data filed at 11/13/2023 0436 Gross per 24 hour  Intake 2225.76 ml  Output 4100 ml  Net -1874.24 ml      11/13/2023     3:37 AM 11/12/2023   10:09 AM 11/10/2023   11:57 AM  Last 3 Weights  Weight (lbs) 229 lb 8 oz 229 lb 8 oz 234 lb 9.1 oz  Weight (kg) 104.1 kg 104.1 kg 106.4 kg      Telemetry    Normal sinus rhythm- Personally Reviewed  ECG    No new EKG to review- Personally Reviewed  Physical Exam   GEN: Very lethargic and ill-appearing HEENT: Normal NECK: No JVD; No carotid bruits LYMPHATICS: No lymphadenopathy CARDIAC:RRR, no murmurs, rubs, gallops RESPIRATORY:  Clear to auscultation without rales, wheezing or rhonchi  ABDOMEN: Soft, non-tender, non-distended MUSCULOSKELETAL:  No edema; right BKA SKIN: Warm and dry NEUROLOGIC: Oriented only to place PSYCHIATRIC: Cannot assess Labs    High Sensitivity Troponin:   Recent Labs  Lab 11/09/23 1111 11/09/23 1314 11/10/23 0907 11/10/23 1503 11/11/23 0001  TROPONINIHS 8,442* 8,849* 8,942* 7,282* 7,672*     Chemistry Recent Labs  Lab 11/08/23 1658 11/09/23 1945 11/10/23 1503 11/11/23 0407 11/11/23 1647 11/12/23 0850  NA 130*   < > 130* 128* 127* 132*  K 4.5   < > 5.9* 6.1* 6.3* 4.6  CL 91*   < > 89* 90* 90* 93*  CO2 26   < >  19* 20* 21* 21*  GLUCOSE 290*   < > 196* 259* 275* 214*  BUN 28*   < > 32* 43* 56* 35*  CREATININE 7.50*   < > 8.80* 10.09* 11.53* 8.39*  CALCIUM 10.3   < > 9.7 9.3 10.1 9.6  PROT 8.3*  --  9.3*  --   --   --   ALBUMIN 3.3*   < > 3.5 3.0* 3.3* 3.1*  AST 44*  --  117*  --   --   --   ALT 34  --  67*  --   --   --   ALKPHOS 127*  --  135*  --   --   --   BILITOT 0.6  --  1.0  --   --   --   GFRNONAA 8*   < > 6* 5* 5* 7*  ANIONGAP 13   < > 22* 18* 16* 18*   < > = values in this interval not displayed.    Lipids No results for input(s): "CHOL", "TRIG", "HDL", "LABVLDL", "LDLCALC", "CHOLHDL" in the last 168 hours.  Hematology Recent Labs  Lab 11/11/23 0407 11/12/23 0921 11/13/23 0442  WBC 10.7* 9.3 8.3  RBC 5.07 5.04 5.30  HGB 14.7 14.4 15.4  HCT 46.3 45.2 46.7  MCV 91.3 89.7 88.1  MCH 29.0  28.6 29.1  MCHC 31.7 31.9 33.0  RDW 15.7* 15.4 15.1  PLT 149* 155 166   Thyroid No results for input(s): "TSH", "FREET4" in the last 168 hours.  BNPNo results for input(s): "BNP", "PROBNP" in the last 168 hours.  DDimer No results for input(s): "DDIMER" in the last 168 hours.   Radiology    MR BRAIN WO CONTRAST Result Date: 11/11/2023 CLINICAL DATA:  Mental status change, persistent or worsening. Single episode during hemodialysis. Chest pain and hypotension. EXAM: MRI HEAD WITHOUT CONTRAST TECHNIQUE: Multiplanar, multiecho pulse sequences of the brain and surrounding structures were obtained without intravenous contrast. COMPARISON:  CT head without contrast 11/09/2023. MR head 02/04/2022 FINDINGS: Brain: Somewhat ill-defined restricted diffusion is present within the splenium of the corpus callosum. No other restricted diffusion is present. Mild atrophy and white matter changes are otherwise stable. Remote lacunar infarcts of the basal ganglia and thalami are again noted bilaterally. The ventricles are proportionate to the degree of atrophy. No significant extraaxial fluid collection is present. The brainstem and cerebellum are within normal limits. The internal auditory canals are within normal limits. Midline structures are within normal limits. Vascular: Flow is present in the major intracranial arteries. Skull and upper cervical spine: The craniocervical junction is normal. Upper cervical spine is within normal limits. Marrow signal is unremarkable. Sinuses/Orbits: A left mastoid effusion is present. A polyp or mucous retention cyst is present within the left maxillary sinus. The paranasal sinuses and mastoid air cells are otherwise clear. Right lens replacement is noted. Globes and orbits are otherwise within normal limits. Other: IMPRESSION: 1. Somewhat ill-defined restricted diffusion within the splenium of the corpus callosum. This is nonspecific, but most consistent with a cytotoxic lesion of  the corpus callosum. Various etiologies can lead to this appearance including seizures, metabolic disturbance or infection. Demyelination, infarct or posterior reversible encephalopathy syndrome are considered less likely. 2. No other acute intracranial abnormality. 3. Stable atrophy and white matter disease. This likely reflects the sequela of chronic microvascular ischemia. 4. Remote lacunar infarcts of the basal ganglia and thalami bilaterally. 5. Left mastoid effusion. 6. Polyp or mucous retention cyst of the  left maxillary sinus. Electronically Signed   By: Marin Roberts M.D.   On: 11/11/2023 15:01    Cardiac Studies   Echo 11/09/2023  1. Left ventricular ejection fraction, by estimation, is 30 to 35%. The  left ventricle has moderately decreased function. The left ventricle  demonstrates regional wall motion abnormalities (see scoring  diagram/findings for description). There is  moderate concentric left ventricular hypertrophy. Left ventricular  diastolic parameters are consistent with Grade III diastolic dysfunction  (restrictive).   2. Right ventricular systolic function is mildly reduced. The right  ventricular size is normal. There is mildly elevated pulmonary artery  systolic pressure. The estimated right ventricular systolic pressure is  42.2 mmHg.   3. Left atrial size was moderately dilated.   4. The mitral valve is degenerative. Trivial mitral valve regurgitation.  Moderate mitral annular calcification.   5. The aortic valve is tricuspid. There is moderate calcification of the  aortic valve. Aortic valve regurgitation is not visualized. Moderate to  severe aortic valve stenosis - suspect upper end of scale, low flow/low  gradient. Aortic valve area, by VTI  measures 0.98 cm. Aortic valve mean gradient measures 22.0 mmHg.  Dimentionless index 0.19.   6. Aortic dilatation noted. There is borderline dilatation of the aortic  root, measuring 40 mm.   7. The inferior  vena cava is dilated in size with <50% respiratory  variability, suggesting right atrial pressure of 15 mmHg.   Comparison(s): Prior images reviewed side by side. LVEF has decreased,  30-35% range with basal inferoposterior akinesis and restrictive diastolic  filling. Aortic stenosis possibly severe (low flow/low gradient). Mildly  elevated RVSP.    Patient Profile     62 y.o. male with PMH of NICM, nonobstructive CAD, HTN, HLD, DM II, PAD s/p R BKA, and ESRD on HD for 10+ years who presented with NSTEMI, HTN and presyncope. On arrival BP 75/59  Assessment & Plan    NSTEMI - hs trop 7689 --> 7744 - Echo 11/10/2023 showed EF 30-35%, basal inferoposterior akinesis and restrictive diastolic filling, likely low-flow low gradient severe aortic stenosis with low flow and low gradient grade 3 DD.  - originally planned for left and right heart cath to assess CAD and severe AS, procedure has been delayed by his poor mental status, furthermore patient has been having fevers now for over 24 hours and now being treated for possible meningitis .   CAD: previously underwent cath in [place] due to LV dysfunction with EF of 25%, cath showed 30% stenosis in prox LAD and distal RCA, medically managed. Previously underwent stress test in Feb 2021 as part of transplant eval, this showed anterior infarct with peri-infarct ischemia, finding was suspicious for artifact. No repeat cath. Echo in March 2023 showed EF 45-50% with global hypokinesis.  -Continue aspirin 81 mg daily, atorvastatin 40 mg daily -No beta-blocker due to hypotension -Continue IV heparin drip -Plan for right and left heart cath once mental status issues improved  HTN -He was not on any medications for his high blood pressure at home -Now hypotensive possibly related to sepsis on midodrine 10 mg 3 times daily -Systolic BP in the 90s to 100s -Continue midodrine 10 mg 3 times daily  HLD -Check FLP in a.m. -Continue atorvastatin 40 mg  daily  ESRD on HD:  -been on dialysis for 10+ years, followed by [facility] nephrology.  -Urgent dialysis 11/11/2023 for hyperkalemia.  -Next HD today  6.  Fever/septic encephalopathy -more awake today  -concern for possible  meningitis -LP not performed because patient on heparin drip for non-STEMI -Now on empiric antibiotic treatment for possible meningitis -Still with low-grade fever -Neurology following  I spent 35 minutes caring for this patient today face to face, ordering and reviewing labs, reviewing records from this hospitalization including neurology consult yesterday , seeing the patient, documenting in the record  For questions or updates, please contact [facility] Please consult [email or web address] for contact info under        Signed, Armanda Magic, MD  11/13/2023, 7:37 AM

## 2023-11-13 NOTE — Progress Notes (Signed)
NEUROLOGY CONSULT FOLLOW UP NOTE   Date of service: November 13, 2023 Patient Name: [name] BEIGHLEY Sr. MRN:  [id number] DOB:  August 08, 1961  Brief Review of HPI  62 year old male with a PMHx of ESRD on dialysis TTS, chronic HFmrEF, insulin-dependent diabetes, hypertension, PVD status post right BKA, history of stroke, hypergammaglobulinemia, hyperferritinemia, recent prior admissions for hyperglycemia with CBG > 600 in the setting of noncompliance with home insulin, followed by a subsequent admission for a fall and recurrent hyperglycemia with blood glucose of 472, QT prolongation, recent T wave inversions on EKG, who re-presented to the ED via EMS on 12/22 for severe hypotension after being called for unremitting coughing with near-syncope and SOB while at dialysis. Troponin was markedly elevated at 7689. Presentation was suspicious for NSTEMI. Cardiology was consulted and he was started on a heparin drip in conjunction with IVF and ASA.   CT head showed no acute intracranial process, with chronic lacunar infarcts noted.    Overnight on 12/23 he became "groggy" and somnolent. Follow-up echocardiogram showed reduction in LVEF, now 30 to 35% range with global hypokinesis and basal inferoposterior akinesis. Aortic stenosis was potentially severe. Cardiology is considering cardiac catheterization for evaluation of coronary anatomy and further assessment of aortic stenosis, presuming mental status clears and he is able to consent.   On Wednesday (12/25) he was noted to be significantly worse in terms of cognition. After receiving dialysis overnight he was somewhat improved on Thursday's neurology assessment, being able to open eyes and answer with one-word replies, but was still somnolent.    Interval Hx/subjective  Today he is further improved, remaining awake during the assessment without requiring constant stimulation, following simple commands.   Vitals   Vitals:   11/13/23 0410 11/13/23 0431  11/13/23 0440 11/13/23 0447  BP:  (!) 100/48 107/81   Pulse:  93 93   Resp:  20 16   Temp: (!) 100.5 F (38.1 C)   99 F (37.2 C)  TempSrc:      SpO2:  100% 100%   Weight:      Height:         Body mass index is 30.28 kg/m.  Physical Exam   Physical Exam  HEENT:  [place]/AT.  Lungs: Respirations unlabored Extremities: Warm and well perfused. Right BKA.    Neurological Examination Mental Status: Drowsy but awakens to voice. Follows all simple commands and answers simple questions correctly including his name and location. Significantly increased latencies of verbal and motor responses. All verbal responses are no more than 1-2 words in length.   Cranial Nerves: II: PERRL. Will identify objects by sight.    III,IV, VI: Eyes are conjugate and near the midline. EOMI. No nystagmus.  VII: Grimace is symmetric VIII: Hearing intact to voice IX,X: Gag reflex deferred XI: Head is midline Motor: Normal tone x 4 without asymmetry.  5/5 BUE, but slow to respond. Moves BLE to command.  Has right BKA.  Sensory: Intact to gross touch x 4 Cerebellar: Optic ataxia manifesting as difficulty moving his finger to target bilaterally.  Gait: Deferred  Medications  Current Facility-Administered Medications:    0.9 %  sodium chloride infusion, , Intravenous, Continuous, Ilda Basset, [place], Last Rate: 50 mL/hr at 11/12/23 1855, Infusion Verify at 11/12/23 1855   acetaminophen (TYLENOL) tablet 650 mg, 650 mg, Oral, Q6H PRN, 650 mg at 11/13/23 0411 **OR** acetaminophen (TYLENOL) suppository 650 mg, 650 mg, Rectal, Q6H PRN, Coralie Keens, MD, 650 mg at 11/10/23  2045   acyclovir (ZOVIRAX) 450 mg in dextrose 5 % 100 mL IVPB, 5 mg/kg (Adjusted), Intravenous, Q24H, [place], Lynita Lombard, [place], Stopped at 11/12/23 1838   ampicillin (OMNIPEN) 2 g in sodium chloride 0.9 % 100 mL IVPB, 2 g, Intravenous, Q12H, Ilda Basset, [place], Last Rate: 300 mL/hr at 11/13/23 1610, 2 g at 11/13/23 9604    aspirin chewable tablet 81 mg, 81 mg, Oral, Daily, Arrien, York Ram, MD, 81 mg at 11/13/23 5409   atorvastatin (LIPITOR) tablet 40 mg, 40 mg, Oral, Daily, Arrien, York Ram, MD, 40 mg at 11/13/23 8119   cefTRIAXone (ROCEPHIN) 2 g in sodium chloride 0.9 % 100 mL IVPB, 2 g, Intravenous, Q12H, [place], Jennifer D, [place], Last Rate: 200 mL/hr at 11/13/23 1478, 2 g at 11/13/23 2956   Chlorhexidine Gluconate Cloth 2 % PADS 6 each, 6 each, Topical, Daily, Arrien, York Ram, MD, 6 each at 11/13/23 [phone]   cinacalcet (SENSIPAR) tablet 30 mg, 30 mg, Oral, Q breakfast, Arrien, York Ram, MD, 30 mg at 11/13/23 8657   doxercalciferol (HECTOROL) injection 6 mcg, 6 mcg, Intravenous, Q M,W,F-HD, Pola Corn, NP, 6 mcg at 11/10/23 0810   heparin ADULT infusion 100 units/mL (25000 units/23mL), 1,350 Units/hr, Intravenous, Continuous, [place], Rachael I, RPH, Last Rate: 13.5 mL/hr at 11/13/23 0508, 1,350 Units/hr at 11/13/23 0508   insulin aspart (novoLOG) injection 0-6 Units, 0-6 Units, Subcutaneous, TID WC, Arrien, York Ram, MD, 2 Units at 11/13/23 1239   [START ON 11/14/2023] insulin glargine-yfgn (SEMGLEE) injection 12 Units, 12 Units, Subcutaneous, Daily, Sira, Zackery, MD   midodrine (PROAMATINE) tablet 10 mg, 10 mg, Oral, TID WC, Pola Corn, NP, 10 mg at 11/13/23 1239   mupirocin ointment (BACTROBAN) 2 % 1 Application, 1 Application, Nasal, BID, Arrien, York Ram, MD, 1 Application at 11/13/23 0823   ondansetron (ZOFRAN) tablet 4 mg, 4 mg, Oral, Q6H PRN **OR** ondansetron (ZOFRAN) injection 4 mg, 4 mg, Intravenous, Q6H PRN, Arrien, York Ram, MD   Oral care mouth rinse, 15 mL, Mouth Rinse, PRN, Arrien, York Ram, MD   sevelamer carbonate (RENVELA) tablet 2,400 mg, 2,400 mg, Oral, BID PRN, Arrien, York Ram, MD   sevelamer carbonate (RENVELA) tablet 4,000 mg, 4,000 mg, Oral, TID WC, Arrien, York Ram, MD, 4,000 mg at 11/13/23 1239    sodium zirconium cyclosilicate (LOKELMA) packet 10 g, 10 g, Oral, Daily, Pola Corn, NP, 10 g at 11/13/23 0824   vancomycin (VANCOCIN) IVPB 1000 mg/200 mL premix, 1,000 mg, Intravenous, Q M,W,F-HD, Silvana Newness, [facility], Stopped at 11/12/23 1153  Labs and Diagnostic Imaging   CBC:  Recent Labs  Lab 11/08/23 1658 11/09/23 0328 11/10/23 1503 11/11/23 0407 11/13/23 0442 11/13/23 0816  WBC 6.3   < > 9.6   < > 8.3 7.7  NEUTROABS 3.7  --  6.6  --   --   --   HGB 14.4   < > 15.3   < > 15.4 15.0  HCT 44.6   < > 47.4   < > 46.7 45.9  MCV 91.6   < > 89.4   < > 88.1 89.1  PLT 176   < > 179   < > 166 149*   < > = values in this interval not displayed.    Basic Metabolic Panel:  Lab Results  Component Value Date   NA 130 (L) 11/13/2023   K 3.9 11/13/2023   CO2 23 11/13/2023   GLUCOSE 258 (H) 11/13/2023   BUN 30 (  H) 11/13/2023   CREATININE 7.21 (H) 11/13/2023   CALCIUM 9.2 11/13/2023   GFRNONAA 8 (L) 11/13/2023   GFRAA 3 (L) 08/14/2019   Lipid Panel:  Lab Results  Component Value Date   LDLCALC 39 11/13/2023   HgbA1c:  Lab Results  Component Value Date   HGBA1C >15.5 (H) 10/24/2023   Urine Drug Screen: No results found for: "LABOPIA", "COCAINSCRNUR", "LABBENZ", "AMPHETMU", "THCU", "LABBARB"  Alcohol Level     Component Value Date/Time   ETH <10 02/02/2022 0152   INR  Lab Results  Component Value Date   INR 1.2 11/08/2023   APTT  Lab Results  Component Value Date   APTT 28 11/08/2023    Assessment  62 year old male with a PMHx of ESRD on dialysis TTS, chronic HFmrEF, insulin-dependent diabetes, hypertension, PVD status post right BKA, history of stroke, hypergammaglobulinemia, hyperferritinemia, recent prior admissions for hyperglycemia with CBG > 600 in the setting of noncompliance with home insulin, followed by a subsequent admission for a fall and recurrent hyperglycemia with blood glucose of 472, QT prolongation, recent T wave inversions on EKG, who  re-presented to the ED via EMS on 12/22 for severe hypotension after being called for unremitting coughing with near-syncope and SOB while at dialysis. Troponin was markedly elevated at 7689. Presentation was suspicious for NSTEMI. Cardiology was consulted and he was started on a heparin drip in conjunction with IVF and ASA. Yesterday (12/25) he was noted to be significantly worse in terms of cognition. After receiving dialysis overnight he is somewhat improved, being able to open eyes and answer with one-word replies, but still somnolent. Today he is further improved, remaining awake during the assessment without requiring constant stimulation, following simple commands.  - Exam reveals a drowsy patient who follows all simple commands and can verbalize some correct answers to orientation questions. Significantly increased latencies of  motor and verbal responses. No seizure like activity or nuchal rigidity.  - Imaging: - CT head: No acute intracranial process, with chronic lacunar infarcts noted.  - MRI brain (personally reviewed): Symmetrical restricted diffusion straddling the midline within the splenium of the corpus callosum, with subtle corresponding FLAIR hyperintensity. 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 Various etiologies can lead to this appearance including seizures, metabolic disturbance, certain medications or infection. No other acute intracranial abnormality. Stable atrophy and white matter disease, likely reflecting the sequela of chronic microvascular ischemia. Remote lacunar infarcts of the basal ganglia and thalami bilaterally. Left mastoid effusion.  - Still having low-grade fevers.  - Also hypotensive overnight as low as 62 mmHg systolic. - Urology saw yesterday and feels he has septic encephalopathy.  - Due to suspected NSTEMI, Cardiology is considering cardiac catheterization for evaluation of coronary anatomy and further  assessment of aortic stenosis, presuming mental status clears and he is able to consent. - DDx: - Overall presentation is most consistent with a septic encephalopathy. Moderate rather than high suspicion for meningitis due to no nuchal rigidity, but in the context of AMS, leukocytosis and fever, meningitis is certainly a consideration. Cannot perform LP due to heparin drip, which he is on for suspected NSTEMI. Will need empiric meningitis-dose ABX including ampicillin as he is debilitated. Also will need acyclovir.  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 Usually these lesions are reversible if they are symmetric and  without mass effect, as is the case with the current MRI.  Marland Kitchen RECOMMENDATIONS  - Pharmacy has been consulted for dosing of meningitis-dose ampicillin, vancomycin, ceftriaxone and acyclovir - EEG (ordered) - Neurology will continue to follow ______________________________________________________________________   Dessa Phi, Damariz Paganelli, MD [facility]

## 2023-11-13 NOTE — Inpatient Diabetes Management (Signed)
Inpatient Diabetes Program Recommendations  AACE/ADA: New Consensus Statement on Inpatient Glycemic Control (2015)  Target Ranges:  Prepandial:   less than 140 mg/dL      Peak postprandial:   less than 180 mg/dL (1-2 hours)      Critically ill patients:  140 - 180 mg/dL   Lab Results  Component Value Date   GLUCAP 230 (H) 11/13/2023   HGBA1C >15.5 (H) 10/24/2023    Latest Reference Range & Units 11/12/23 07:39 11/12/23 12:12 11/12/23 16:12 11/13/23 07:44  Glucose-Capillary 70 - 99 mg/dL 409 (H) 811 (H) 914 (H) 230 (H)  (H): Data is abnormally high  Diabetes history: DM2 Outpatient Diabetes medications: 70/30 30 units am, 25 units pm Current orders for Inpatient glycemic control: Semglee 8 units daily, Novolog 0-6 units tid  Inpatient Diabetes Program Recommendations:   Please consider: -Increase Semglee to 20 units daily (give additional 12 units today) -Add Novolog meal coverage when eating 50% meals  Thank you, Darel Hong E. Worth Kober, RN, MSN, CDCES  Diabetes Coordinator Inpatient Glycemic Control Team Team Pager [phone] (8am-5pm) 11/13/2023 10:50 AM

## 2023-11-13 NOTE — Progress Notes (Signed)
EEG complete - results pending 

## 2023-11-13 NOTE — Consult Note (Signed)
PHARMACY - ANTICOAGULATION CONSULT NOTE  Pharmacy Consult for Heparin infusion Indication: chest pain/ACS + AFib  Allergies  Allergen Reactions   Sulfa Antibiotics Diarrhea and Nausea And Vomiting    Patient Measurements: Height: 6\' 1"  (185.4 cm) Weight: 104.1 kg (229 lb 8 oz) IBW/kg (Calculated) : 79.9 Heparin Dosing Weight: 103.9 kg  Vital Signs: Temp: 99 F (37.2 C) (12/27 0447) Temp Source: Oral (12/27 0337) BP: 107/81 (12/27 0440) Pulse Rate: 93 (12/27 0440)  Labs: Recent Labs    11/10/23 0907 11/10/23 1126 11/10/23 1503 11/11/23 0001 11/11/23 0407 11/11/23 0913 11/11/23 1647 11/11/23 2046 11/12/23 0425 11/12/23 0850 11/12/23 0921 11/13/23 0442  HGB  --    < > 15.3  --  14.7  --   --   --   --   --  14.4 15.4  HCT  --    < > 47.4  --  46.3  --   --   --   --   --  45.2 46.7  PLT  --    < > 179  --  149*  --   --   --   --   --  155 166  HEPARINUNFRC  --    < >  --   --  >1.10*   < >  --  0.33 0.48  --   --  0.61  CREATININE  --    < > 8.80*  --  10.09*  --  11.53*  --   --  8.39*  --   --   TROPONINIHS 1,610*  --  7,282* 9,604*  --   --   --   --   --   --   --   --    < > = values in this interval not displayed.    Estimated Creatinine Clearance: 11.7 mL/min (A) (by C-G formula based on SCr of 8.39 mg/dL (H)).   Medical History: Past Medical History:  Diagnosis Date   ESRD on hemodialysis (HCC)    a. HD MWF > 10 years.   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 01/2022 Echo: EF 45-50%, glob HK, mod LVH, mildly reduced RV fxn, mild MR, mod-sev MV Ca2+. Mild AS; b. 10/2023 Echo: EF 45-50%, glob HK, GrII DD, mildly reduced RV fxn, RVSP 52.15mmHg. Mild MR/MS, mod TR.   Hypertension    Nonischemic cardiomyopathy (HCC)    a. 2019 Cath [facility]): Nonobstructive disease; b. 12/2019 Echo: EF 50%; c. 12/2019 MV (Tx w/u [facility]): Ant infarct w/ peri-infarct ischemia. EF 35%; c. 05/2021 MV (Tx w/u [facility]): Cor Ca2+, EF 53%, motion artifact, equiv  study, likely low risk.   Nonobstructive coronary artery disease    a.  2019 Cath ([facility] [facility]): Left main normal, LAD 30 proximal, RCA 30 distal, EF 25%.   Obesity    Type 2 diabetes mellitus (HCC)    a. 10/2023 - admission w/ [facility]HS.     Assessment: 49 yoM ESRD admitted with CP and AFL. Pharmacy to dose IV heparin.  Heparin level 0.61 is therapeutic on 1350 units/hr.  Hgb 15.4, pltc 166.  No issues noted.  Goal of Therapy:  Heparin level 0.3-0.7 units/ml Monitor platelets by anticoagulation protocol: Yes   Plan:  Continue heparin at 1350 units/hr Monitor daily CBC, heparin level and s/sx of bleeding   Thank you for involving pharmacy in the patient's care.   Trixie Rude, PharmD Clinical Pharmacist 11/13/2023  7:13 AM

## 2023-11-13 NOTE — Progress Notes (Addendum)
   11/13/23 3244 11/13/23 0352 11/13/23 0353  Assess: MEWS Score  Temp 99 F (37.2 C)  --   --   BP  --  (!) 89/48  --   MAP (mmHg)  --  (!) 60  --   Pulse Rate  --  93 93  ECG Heart Rate  --  92 93  Resp  --  17 15  SpO2 100 % 100 % 100 %  O2 Device Nasal Cannula  --   --   Assess: MEWS Score  MEWS Temp 0 0 0  MEWS Systolic 0 1 1  MEWS Pulse 0 0 0  MEWS RR 0 0 0  MEWS LOC 1 1 1   MEWS Score 1 2 2   MEWS Score Color Green Yellow Yellow  Assess: if the MEWS score is Yellow or Red  Were vital signs accurate and taken at a resting state?  --   --  Yes  Does the patient meet 2 or more of the SIRS criteria?  --   --  Yes  Does the patient have a confirmed or suspected source of infection?  --   --  No  MEWS guidelines implemented   --   --  Yes, yellow  Treat  MEWS Interventions  --   --  Considered administering scheduled or prn medications/treatments as ordered  Take Vital Signs  Increase Vital Sign Frequency   --   --  Yellow: Q2hr x1, continue Q4hrs until patient remains green for 12hrs  Escalate  MEWS: Escalate  --   --  Yellow: Discuss with charge nurse and consider notifying provider and/or RRT  Provider Notification  Provider Name/Title  --   --  Arville Care MD  Date Provider Notified  --   --  11/13/23  Time Provider Notified  --   --  [phone]  Method of Notification  --   --  Page  Provider response  --   --  See new orders  Assess: SIRS CRITERIA  SIRS Temperature  0 0 0  SIRS Respirations  0 0 0  SIRS Pulse 1 1 1   SIRS WBC 0 0 0  SIRS Score Sum  1 1 1      0530: bolus & tylenol given MEWS now green

## 2023-11-13 NOTE — Progress Notes (Signed)
Progress Note   Patient: [name] OLSHANSKY Sr. ZOX:[id number] DOB: February 01, 1961 DOA: 11/08/2023     5 DOS: the patient was seen and examined on 11/13/2023   Brief hospital course: [name] MARSICO Sr. is a 62 y.o. male with medical history significant of ESRD on HD, (MWF) heart failure, T2DM, sp right BKA and obesity who presented after experiencing a near syncope during hemodialysis. Recent hospitalization 12/06 to 12/10 for uncontrolled hyperglycemia. Patient was having HD on 12/22 when he was noted to have chest pain and hypotension, along with a near syncope episode. Apparently treatment was stopped and EMS was called. He wss found hypotensive and was transported to the ED. His blood pressure was initially low in the ED 75/59, but improved to 106/72. He was found to have very elevated high sensitive troponin and cardiology was consulted for evaluation.  He was diagnosed with NSTEMI and plan to admit patient to [facility] under cardiology. Pt was transferred to [facility] for further evaluation.   Assessment and Plan: * NSTEMI (non-ST elevated myocardial infarction) (HCC) - IV heparin drip  - ASA 81 mg PO daily  - Lipitor 40 mg PO daily  - Cardiac cath is at the discretion of cardiology   ESRD on dialysis [facility]) - Dialysis per nephrology  - Renvela 4000 mg PO tid  - Sensipar 30 mg PO daily - Midodrine 10 mg PO tid   Acute on chronic systolic CHF (congestive heart failure) (HCC) - Monitor; appreciate cardiology following along   HTN (hypertension) - Pt is on midodrine   Fever w/ acute metabolic encephalopathy  - IV NS 50 cc/hr  - IV acyclovir 450 mg daily  - IV ampicillin 2 g q12  - IV ceftriaxone 2 g q12  - IV vancomycin 1g w/ HD   DM2 (diabetes mellitus, type 2) (HCC) - Novolog SS tid  - Semglee 12 unit sq daily  History of CVA  - ASA 81 mg PO daily  - Lipitor 40 mg PO daily    Protein-calorie malnutrition, mild (HCC) - Complicating care   Obesity (BMI 30-39.9) - Complicating care      Subjective: Pt seen and examined at the bedside. Pt was able to tell me today that he was in the hospital and that the year was 2024. He indeed is still having fevers (100.49F at 410 AM).  He continues on IV antibx's. Cardiac cath is on hold until his mentation is reliably improved.   Physical Exam: Vitals:   11/13/23 0410 11/13/23 0431 11/13/23 0440 11/13/23 0447  BP:  (!) 100/48 107/81   Pulse:  93 93   Resp:  20 16   Temp: (!) 100.5 F (38.1 C)   99 F (37.2 C)  TempSrc:      SpO2:  100% 100%   Weight:      Height:       Physical Exam HENT:     Head: Normocephalic.  Cardiovascular:     Rate and Rhythm: Normal rate and regular rhythm.  Pulmonary:     Effort: Pulmonary effort is normal.  Abdominal:     Palpations: Abdomen is soft.  Skin:    General: Skin is warm.  Neurological:     Mental Status: He is alert.     Comments: Oriented x 2   Psychiatric:        Mood and Affect: Mood normal.       Disposition: Status is: Inpatient Remains inpatient appropriate because: IV antibx, cardiac cath, HD  Planned Discharge Destination:  Dispo per pt's clinical progress     Time spent: 35 minutes  Author: Baron Hamper , MD 11/13/2023 12:03 PM  For on call review [email or web address].

## 2023-11-13 NOTE — Plan of Care (Signed)
  Problem: Health Behavior/Discharge Planning: Goal: Ability to manage health-related needs will improve Outcome: Progressing   Problem: Clinical Measurements: Goal: Ability to maintain clinical measurements within normal limits will improve Outcome: Progressing Goal: Will remain free from infection Outcome: Progressing   Problem: Activity: Goal: Risk for activity intolerance will decrease Outcome: Progressing   Problem: Nutrition: Goal: Adequate nutrition will be maintained Outcome: Progressing   Problem: Elimination: Goal: Will not experience complications related to bowel motility Outcome: Progressing   Problem: Pain Management: Goal: General experience of comfort will improve Outcome: Progressing

## 2023-11-14 DIAGNOSIS — I214 Non-ST elevation (NSTEMI) myocardial infarction: Secondary | ICD-10-CM | POA: Diagnosis not present

## 2023-11-14 LAB — RENAL FUNCTION PANEL
Albumin: 2.7 g/dL — ABNORMAL LOW (ref 3.5–5.0)
Anion gap: 18 — ABNORMAL HIGH (ref 5–15)
BUN: 40 mg/dL — ABNORMAL HIGH (ref 8–23)
CO2: 20 mmol/L — ABNORMAL LOW (ref 22–32)
Calcium: 9.5 mg/dL (ref 8.9–10.3)
Chloride: 91 mmol/L — ABNORMAL LOW (ref 98–111)
Creatinine, Ser: 9.31 mg/dL — ABNORMAL HIGH (ref 0.61–1.24)
GFR, Estimated: 6 mL/min — ABNORMAL LOW (ref >60)
Glucose, Bld: 277 mg/dL — ABNORMAL HIGH (ref 70–99)
Phosphorus: 4.8 mg/dL — ABNORMAL HIGH (ref 2.5–4.6)
Potassium: 4.1 mmol/L (ref 3.5–5.1)
Sodium: 129 mmol/L — ABNORMAL LOW (ref 135–145)

## 2023-11-14 LAB — CBC
HCT: 46.4 % (ref 39.0–52.0)
HCT: 49.9 % (ref 39.0–52.0)
Hemoglobin: 14.7 g/dL (ref 13.0–17.0)
Hemoglobin: 16 g/dL (ref 13.0–17.0)
MCH: 28.1 pg (ref 26.0–34.0)
MCH: 28.5 pg (ref 26.0–34.0)
MCHC: 31.7 g/dL (ref 30.0–36.0)
MCHC: 32.1 g/dL (ref 30.0–36.0)
MCV: 88.5 fL (ref 80.0–100.0)
MCV: 88.9 fL (ref 80.0–100.0)
Platelets: 169 10*3/uL (ref 150–400)
Platelets: 167 10*3/uL (ref 150–400)
RBC: 5.24 MIL/uL (ref 4.22–5.81)
RBC: 5.61 MIL/uL (ref 4.22–5.81)
RDW: 14.9 % (ref 11.5–15.5)
RDW: 14.7 % (ref 11.5–15.5)
WBC: 6.4 10*3/uL (ref 4.0–10.5)
WBC: 5.2 10*3/uL (ref 4.0–10.5)
nRBC: 0.8 % — ABNORMAL HIGH (ref 0.0–0.2)
nRBC: 1.2 % — ABNORMAL HIGH (ref 0.0–0.2)

## 2023-11-14 LAB — GLUCOSE, CAPILLARY
Glucose-Capillary: 290 mg/dL — ABNORMAL HIGH (ref 70–99)
Glucose-Capillary: 178 mg/dL — ABNORMAL HIGH (ref 70–99)

## 2023-11-14 LAB — HEPARIN LEVEL (UNFRACTIONATED): Heparin Unfractionated: 0.36 [IU]/mL (ref 0.30–0.70)

## 2023-11-14 MED ORDER — ORAL CARE MOUTH RINSE: 15.0000 mL | OROMUCOSAL | Status: DC | PRN

## 2023-11-14 MED ORDER — DOXERCALCIFEROL 4 MCG/2ML IV SOLN
3.0000 ug | INTRAVENOUS | Status: DC
  Administered 2023-11-18 – 2023-12-14 (×10): 3 ug via INTRAVENOUS
  Filled 2023-11-14 (×22): qty 2

## 2023-11-14 MED ORDER — INSULIN ASPART 100 UNIT/ML IJ SOLN
0.0000 [IU] | Freq: Every day | INTRAMUSCULAR | Status: DC
  Administered 2023-11-19 – 2023-11-20 (×2): 2 [IU] via SUBCUTANEOUS
  Administered 2023-11-22 – 2023-11-23 (×2): 3 [IU] via SUBCUTANEOUS
  Administered 2023-11-24: 4 [IU] via SUBCUTANEOUS
  Administered 2023-11-25: 2 [IU] via SUBCUTANEOUS
  Administered 2023-12-04: 3 [IU] via SUBCUTANEOUS

## 2023-11-14 MED ORDER — INSULIN ASPART 100 UNIT/ML IJ SOLN
0.0000 [IU] | Freq: Three times a day (TID) | INTRAMUSCULAR | Status: DC
  Administered 2023-11-14: 5 [IU] via SUBCUTANEOUS
  Administered 2023-11-15: 2 [IU] via SUBCUTANEOUS
  Administered 2023-11-15: 3 [IU] via SUBCUTANEOUS
  Administered 2023-11-15 – 2023-11-17 (×5): 2 [IU] via SUBCUTANEOUS
  Administered 2023-11-17: 3 [IU] via SUBCUTANEOUS
  Administered 2023-11-18 – 2023-11-19 (×4): 2 [IU] via SUBCUTANEOUS
  Administered 2023-11-19: 1 [IU] via SUBCUTANEOUS
  Administered 2023-11-19: 2 [IU] via SUBCUTANEOUS
  Administered 2023-11-20: 3 [IU] via SUBCUTANEOUS
  Administered 2023-11-20 – 2023-11-21 (×2): 2 [IU] via SUBCUTANEOUS
  Administered 2023-11-21 – 2023-11-22 (×3): 3 [IU] via SUBCUTANEOUS
  Administered 2023-11-22 – 2023-11-23 (×3): 2 [IU] via SUBCUTANEOUS
  Administered 2023-11-23 – 2023-11-24 (×2): 3 [IU] via SUBCUTANEOUS
  Administered 2023-11-24: 7 [IU] via SUBCUTANEOUS
  Administered 2023-11-24: 5 [IU] via SUBCUTANEOUS
  Administered 2023-11-25: 2 [IU] via SUBCUTANEOUS
  Administered 2023-11-25: 5 [IU] via SUBCUTANEOUS
  Administered 2023-11-25: 7 [IU] via SUBCUTANEOUS
  Administered 2023-11-26: 3 [IU] via SUBCUTANEOUS
  Administered 2023-11-26: 2 [IU] via SUBCUTANEOUS
  Administered 2023-11-26 – 2023-11-27 (×2): 3 [IU] via SUBCUTANEOUS
  Administered 2023-11-27: 2 [IU] via SUBCUTANEOUS
  Administered 2023-11-27: 3 [IU] via SUBCUTANEOUS
  Administered 2023-11-28 (×2): 2 [IU] via SUBCUTANEOUS
  Administered 2023-11-28 – 2023-11-29 (×2): 1 [IU] via SUBCUTANEOUS
  Administered 2023-11-29 – 2023-11-30 (×3): 2 [IU] via SUBCUTANEOUS
  Administered 2023-12-01: 3 [IU] via SUBCUTANEOUS
  Administered 2023-12-01: 1 [IU] via SUBCUTANEOUS
  Administered 2023-12-03: 2 [IU] via SUBCUTANEOUS
  Administered 2023-12-03: 1 [IU] via SUBCUTANEOUS
  Administered 2023-12-03: 2 [IU] via SUBCUTANEOUS
  Administered 2023-12-04 (×2): 3 [IU] via SUBCUTANEOUS
  Administered 2023-12-04 – 2023-12-05 (×2): 2 [IU] via SUBCUTANEOUS
  Administered 2023-12-05 – 2023-12-06 (×2): 1 [IU] via SUBCUTANEOUS
  Administered 2023-12-06 – 2023-12-08 (×4): 2 [IU] via SUBCUTANEOUS
  Administered 2023-12-08: 1 [IU] via SUBCUTANEOUS
  Administered 2023-12-08: 2 [IU] via SUBCUTANEOUS
  Administered 2023-12-09: 3 [IU] via SUBCUTANEOUS
  Administered 2023-12-10: 1 [IU] via SUBCUTANEOUS
  Administered 2023-12-12 – 2023-12-13 (×2): 2 [IU] via SUBCUTANEOUS
  Administered 2023-12-13 – 2023-12-14 (×3): 1 [IU] via SUBCUTANEOUS
  Administered 2023-12-15 (×2): 2 [IU] via SUBCUTANEOUS

## 2023-11-14 MED ORDER — LIDOCAINE-PRILOCAINE 2.5-2.5 % EX CREA
1.0000 | TOPICAL_CREAM | CUTANEOUS | Status: DC | PRN
  Filled 2023-11-14: qty 5

## 2023-11-14 MED ORDER — HEPARIN SODIUM (PORCINE) 1000 UNIT/ML DIALYSIS
1000.0000 [IU] | INTRAMUSCULAR | Status: DC | PRN
  Filled 2023-11-14: qty 1

## 2023-11-14 MED ORDER — ALBUMIN HUMAN 25 % IV SOLN
25.0000 g | Freq: Once | INTRAVENOUS | Status: AC
  Administered 2023-11-14: 12.5 g via INTRAVENOUS
  Filled 2023-11-14: qty 100

## 2023-11-14 MED ORDER — HEPARIN SODIUM (PORCINE) 1000 UNIT/ML DIALYSIS: 5000.0000 [IU] | Freq: Once | INTRAMUSCULAR | Status: DC

## 2023-11-14 MED ORDER — PENTAFLUOROPROP-TETRAFLUOROETH EX AERO: 1.0000 | INHALATION_SPRAY | CUTANEOUS | Status: DC | PRN

## 2023-11-14 MED ORDER — ORAL CARE MOUTH RINSE
15.0000 mL | OROMUCOSAL | Status: DC | PRN
Start: 2023-11-14 — End: 2023-11-30

## 2023-11-14 MED ORDER — LIDOCAINE HCL (PF) 1 % IJ SOLN: 5.0000 mL | INTRAMUSCULAR | Status: DC | PRN

## 2023-11-14 NOTE — Evaluation (Signed)
Occupational Therapy Evaluation Patient Details Name: [name] LEEDER Sr. MRN: [id number] DOB: October 04, 1961 Today's Date: 11/14/2023   History of Present Illness Pt is 62 year old presented to [facility] on  11/14/23 after experiencing a near syncope during HD. Pt recently admitted 10/23/23 for a fall. PMH - htn, cardiomyopathy, CHF, dm, esrd on hd, R BKA, CVA   Clinical Impression   Pt admitted for above, presented with very low tolerance and became dizzy sitting EOB so he requested to lay back down not long after transitioning to sitting, dizziness may have impacted his balance as it ranged from CGA to total A. Pt has L sided deficits from past CVA, L hand is very hypersensitive and contracted but unsure if he will tolerate splint wear to reduce further contracture. OT to continue following pt acutely in effort to address listed deficits and help transition to next level of care. Patient would benefit from post acute skilled rehab facility with <3 hours of therapy and 24/7 support       If plan is discharge home, recommend the following: Two people to help with walking and/or transfers;Two people to help with bathing/dressing/bathroom;Assistance with cooking/housework;Supervision due to cognitive status    Functional Status Assessment  Patient has had a recent decline in their functional status and demonstrates the ability to make significant improvements in function in a reasonable and predictable amount of time.  Equipment Recommendations  None recommended by OT (defer to next level of care)    Recommendations for Other Services       Precautions / Restrictions Precautions Precautions: Fall Required Braces or Orthoses: Other Brace Other Brace: R prosthetic (not sure if fitting) Restrictions Weight Bearing Restrictions Per Provider Order: No Other Position/Activity Restrictions: R BKA      Mobility Bed Mobility Overal bed mobility: Needs Assistance Bed Mobility: Sidelying to Sit, Sit  to Supine   Sidelying to sit: Used rails, HOB elevated, Max assist   Sit to supine: Max assist, +2 for safety/equipment, +2 for physical assistance   General bed mobility comments: pt in side lying upon OT entry, needs significant +2 assist from side>sit    Transfers                          Balance Overall balance assessment: Needs assistance Sitting-balance support: No upper extremity supported, Feet supported Sitting balance-Leahy Scale: Poor Sitting balance - Comments: Poor to zero, pt varying in balance between periods of CGA to Max A. May be fatigued following HD.                                   ADL either performed or assessed with clinical judgement   ADL Overall ADL's : Needs assistance/impaired Eating/Feeding: Bed level;Set up Eating/Feeding Details (indicate cue type and reason): Pt cognition may be affecting assessment because he initially was not able to hold cup with single UE and used both hands to hold it close to body, was later seen picking up cup. Grooming: Bed level;Moderate assistance   Upper Body Bathing: Moderate assistance;Bed level   Lower Body Bathing: Bed level;Maximal assistance   Upper Body Dressing : Bed level;Maximal assistance   Lower Body Dressing: Bed level;Total assistance     Toilet Transfer Details (indicate cue type and reason): not able to transfer on IE  Vision         Perception         Praxis         Pertinent Vitals/Pain Pain Assessment Pain Assessment: Faces Faces Pain Scale: Hurts even more Pain Location: L hand with any touch but pt also groaning in pain with general mobility (not identifying where when prompted) Pain Descriptors / Indicators: Sore, Aching, Grimacing, Moaning Pain Intervention(s): Limited activity within patient's tolerance, Monitored during session, Repositioned     Extremity/Trunk Assessment Upper Extremity Assessment RUE Deficits / Details: ROM  WFL, decreased sensation throughout RUE LUE Deficits / Details: L hand seems hypersensitive, pt flinches with attempts to palpate and shouts "ow" with any light touch. Very minimal shoulder AROM, can shrug but not raise arm. MPs contracted to roughly 90* table top   Lower Extremity Assessment RLE Deficits / Details: BKA       Communication Communication Communication: No apparent difficulties Cueing Techniques: Verbal cues   Cognition Arousal: Alert Behavior During Therapy: Flat affect Overall Cognitive Status: No family/caregiver present to determine baseline cognitive functioning                                 General Comments: Pt perseverating on drinks and ice, giving limited reponses     General Comments  Bp sitting EOB 93/61 (72), pt reported being dizzy and requested to lie back down with strong lean against therapist.    Exercises     Shoulder Instructions      Home Living Family/patient expects to be discharged to:: Skilled nursing facility                                        Prior Functioning/Environment                          OT Problem List: Decreased strength;Impaired balance (sitting and/or standing);Decreased activity tolerance;Impaired sensation      OT Treatment/Interventions: Self-care/ADL training;Therapeutic exercise;DME and/or AE instruction;Balance training;Patient/family education;Therapeutic activities    OT Goals(Current goals can be found in the care plan section) Acute Rehab OT Goals Patient Stated Goal: none stated Time For Goal Achievement: 11/28/23 Potential to Achieve Goals: Good ADL Goals Pt Will Perform Eating: bed level;with modified independence;with adaptive utensils (with AE prn) Pt Will Perform Grooming: sitting;with set-up Additional ADL Goal #1: Pt will tolerate >3 mins EOB sitting in preparation for seated ADLs. Additional ADL Goal #2: Pt will complete desensitization exercises for  LUE indepndently  OT Frequency: Min 1X/week    Co-evaluation   Reason for Co-Treatment: Complexity of the patient's impairments (multi-system involvement);Necessary to address cognition/behavior during functional activity;For patient/therapist safety;To address functional/ADL transfers PT goals addressed during session: Mobility/safety with mobility;Balance OT goals addressed during session: ADL's and self-care;Strengthening/ROM      AM-PAC OT "6 Clicks" Daily Activity     Outcome Measure Help from another person eating meals?: A Little Help from another person taking care of personal grooming?: A Lot Help from another person toileting, which includes using toliet, bedpan, or urinal?: A Lot Help from another person bathing (including washing, rinsing, drying)?: A Lot Help from another person to put on and taking off regular upper body clothing?: A Lot Help from another person to put on and taking off regular lower body clothing?: Total 6 Click  Score: 12   End of Session Nurse Communication: Mobility status (significant +2 assist)  Activity Tolerance: Patient limited by lethargy Patient left: in bed;with call bell/phone within reach;with bed alarm set  OT Visit Diagnosis: Other (comment);Unsteadiness on feet (R26.81) (syncope)                Time: 8657-8469 OT Time Calculation (min): 20 min Charges:  OT General Charges $OT Visit: 1 Visit OT Evaluation $OT Eval Moderate Complexity: 1 Mod  11/14/2023  AB, OTR/L  Acute Rehabilitation Services  Office: [phone]   Tristan Schroeder 11/14/2023, 4:50 PM

## 2023-11-14 NOTE — Plan of Care (Signed)
  Problem: Clinical Measurements: Goal: Ability to maintain clinical measurements within normal limits will improve Outcome: Progressing Goal: Respiratory complications will improve Outcome: Progressing Goal: Cardiovascular complication will be avoided Outcome: Progressing   Problem: Safety: Goal: Ability to remain free from injury will improve Outcome: Progressing

## 2023-11-14 NOTE — Progress Notes (Signed)
   Chart reviewed - workup in progress for NSTEMI and systolic HF - LVEF 30-35%, possible low-flow low gradient severe AS - complicated by fevers and presumed septic encephalopathy. Volume status managed by nephrology. Plans for possible R/LHC, no earlier than Monday if mental status has cleared and remains afebrile. Cardiology will be available over the weekend as needed.  Chrystie Nose, MD, [facility], FACP  [place]  [facility]  Medical Director of the [facility]id [facility] &  Cardiovascular Risk Reduction Clinic Diplomate of the American Board of Clinical Lipidology Attending Cardiologist  Direct Dial: [phone]  Fax: [phone]  Website:  [email or web address]

## 2023-11-14 NOTE — Progress Notes (Signed)
Physical Therapy Evaluation Patient Details Name: [name] [name] Sr. MRN: [id number] DOB: 14-Jun-1961 Today's Date: 11/14/2023  History of Present Illness  Pt is 62 year old presented to [facility] on  11/08/23 after experiencing a near syncope during HD. Pt being worked up for an NSTEMI with planned LHC and RHC possibly 11/16/23, and fever with acute metabolic encephalopathy.  Pt recently admitted 12/6-12/10/24 for a fall. PMH - htn, cardiomyopathy, CHF, dm, esrd on hd, R BKA, CVA  Clinical Impression  Pt significantly more deconditioned since his last admission earlier in Dec.  He is max assist for bed level activities and to attempt to sit EOB.  His cognition appears worse than last admission as well.  He will need significant post acute therapy at discharge, and I do not know if his prosthesis was ever checked since he d/c by [facility].  Roselyn Meier from [place] was to f/u per Alycia Rossetti (PT's) suggestion.   PT to follow acutely for deficits listed below.         If plan is discharge home, recommend the following: A little help with walking and/or transfers;Assist for transportation;Help with stairs or ramp for entrance;Assistance with cooking/housework;A little help with bathing/dressing/bathroom   Can travel by private vehicle     NO    Equipment Recommendations None recommended by PT  Recommendations for Other Services       Functional Status Assessment Patient has had a recent decline in their functional status and demonstrates the ability to make significant improvements in function in a reasonable and predictable amount of time.     Precautions / Restrictions Precautions Precautions: Fall Required Braces or Orthoses: Other Brace Other Brace: R prosthetic (not sure if fitting) Restrictions Weight Bearing Restrictions Per Provider Order: No Other Position/Activity Restrictions: R BKA      Mobility  Bed Mobility Overal bed mobility: Needs Assistance Bed Mobility: Sidelying to Sit, Sit to  Supine Rolling: Max assist, Used rails Sidelying to sit: Used rails, HOB elevated, Max assist   Sit to supine: Max assist, +2 for safety/equipment, +2 for physical assistance   General bed mobility comments: Pt max assist for all bed mobility and side to sit and sit to supine transitions.  OT came in to assist. Pt unable to safely attempt standing today due to cog deficits.    Transfers                        Ambulation/Gait                  Stairs            Wheelchair Mobility     Tilt Bed    Modified Rankin (Stroke Patients Only)       Balance Overall balance assessment: Needs assistance Sitting-balance support: Feet supported, Bilateral upper extremity supported (left foot) Sitting balance-Leahy Scale: Poor Sitting balance - Comments: max to total assist in sitting, varied according to OT who was supporting the pt from behind. Postural control: Posterior lean                                   Pertinent Vitals/Pain Pain Assessment Pain Assessment: Faces Faces Pain Scale: Hurts whole lot Pain Location: L hand with any touch but pt also groaning in pain with general mobility (not identifying where when prompted) and seemed hypersensative. Pain Descriptors / Indicators: Grimacing, Guarding Pain Intervention(s): Limited activity  within patient's tolerance, Monitored during session, Repositioned    Home Living Family/patient expects to be discharged to:: Skilled nursing facility Living Arrangements: Other relatives (brother) Available Help at Discharge: Family;Available PRN/intermittently Type of Home: House Home Access: Stairs to enter Entrance Stairs-Rails: Right;Left;Can reach both Entrance Stairs-Number of Steps: 4   Home Layout: One level Home Equipment: Agricultural consultant (2 wheels);Wheelchair - manual Additional Comments: Per chart review, pt unable to relay accurate information    Prior Function Prior Level of Function :  History of Falls (last six months);Needs assist       Physical Assist : Mobility (physical)     Mobility Comments: Uses walker and prosthetic. ADLs Comments: Per pt since he d/c home he has been bed bound.  He reported he was not working with PT at home     Extremity/Trunk Assessment   Upper Extremity Assessment Upper Extremity Assessment: Defer to OT evaluation RUE Deficits / Details: ROM WFL, decreased sensation throughout RUE LUE Deficits / Details: L hand seems hypersensitive, pt flinches with attempts to palpate and shouts "ow" with any light touch. Very minimal shoulder AROM, can shrug but not raise arm. MPs contracted to roughly 90* table top    Lower Extremity Assessment Lower Extremity Assessment: Generalized weakness;RLE deficits/detail;LLE deficits/detail RLE Deficits / Details: R BKA, difficult to assess due to poor command following, did actively move this leg in the bed. LLE Deficits / Details: difficult to assess due to command following difficulties, able to move it actively in the bed and sit with his left foot on the floor in sitting EOB.  Unable to attempt standing today due to cog deficits (despite having two people).       Communication   Communication Communication: No apparent difficulties Cueing Techniques: Verbal cues  Cognition Arousal: Alert Behavior During Therapy: Flat affect Overall Cognitive Status: No family/caregiver present to determine baseline cognitive functioning Area of Impairment: Attention, Following commands, Safety/judgement, Awareness, Problem solving                   Current Attention Level: Focused Memory: Decreased short-term memory Following Commands: Follows one step commands inconsistently Safety/Judgement: Decreased awareness of safety, Decreased awareness of deficits Awareness: Intellectual Problem Solving: Slow processing, Difficulty sequencing, Requires verbal cues, Decreased initiation General Comments: Pt  perseverating on drinks and ice, giving limited reponses        General Comments General comments (skin integrity, edema, etc.): BP sitting EOB 93/61 (72), pt reported being dizzy and requested to lie back down with strong lean against therapist.    Exercises     Assessment/Plan    PT Assessment Patient needs continued PT services  PT Problem List Decreased strength;Decreased activity tolerance;Decreased balance;Decreased mobility       PT Treatment Interventions DME instruction;Gait training;Functional mobility training;Therapeutic activities;Therapeutic exercise;Balance training;Patient/family education    PT Goals (Current goals can be found in the Care Plan section)  Acute Rehab PT Goals Patient Stated Goal: unable to state PT Goal Formulation: Patient unable to participate in goal setting Time For Goal Achievement: 11/28/23 Potential to Achieve Goals: Good    Frequency Min 1X/week     Co-evaluation PT/OT/SLP Co-Evaluation/Treatment: Yes (overlap) Reason for Co-Treatment: Complexity of the patient's impairments (multi-system involvement);Necessary to address cognition/behavior during functional activity;For patient/therapist safety;To address functional/ADL transfers PT goals addressed during session: Mobility/safety with mobility;Balance OT goals addressed during session: ADL's and self-care;Strengthening/ROM       AM-PAC PT "6 Clicks" Mobility  Outcome Measure Help needed turning from your  back to your side while in a flat bed without using bedrails?: Total Help needed moving from lying on your back to sitting on the side of a flat bed without using bedrails?: Total Help needed moving to and from a bed to a chair (including a wheelchair)?: Total Help needed standing up from a chair using your arms (e.g., wheelchair or bedside chair)?: Total Help needed to walk in hospital room?: Total Help needed climbing 3-5 steps with a railing? : Total 6 Click Score: 6    End  of Session   Activity Tolerance: Patient limited by fatigue;Patient limited by lethargy;Patient limited by pain Patient left: in bed;with call bell/phone within reach   PT Visit Diagnosis: Muscle weakness (generalized) (M62.81);Difficulty in walking, not elsewhere classified (R26.2);Pain Pain - Right/Left: Left Pain - part of body: Hand    Time: 1535-1558 PT Time Calculation (min) (ACUTE ONLY): 23 min   Charges:   PT Evaluation $PT Eval Moderate Complexity: 1 Mod   PT General Charges $$ ACUTE PT VISIT: 1 Visit        Corinna Capra, PT, DPT  Acute Rehabilitation Secure chat is best for contact #[phone] office

## 2023-11-14 NOTE — Progress Notes (Addendum)
Subjective: Seen on dialysis alert, recognizes me from outpatient HD, still some slight confusion.  Objective Vital signs in last 24 hours: Vitals:   11/14/23 0421 11/14/23 0531 11/14/23 0745 11/14/23 0755  BP:  101/63 102/63 (!) 94/58  Pulse:   76 76  Resp:  20 20 (!) 21  Temp:   97.8 F (36.6 C)   TempSrc:      SpO2:   97% 99%  Weight: 102.9 kg     Height:       Weight change: -1.2 kg  Physical Exam: General: Alert /adult male NAD/minimal/pleasant confusion  Heart: RRR, 2 /6 sem, no rub or gallop Lungs: CTA bilateral nonlabored breathing Abdomen: BS soft NTND Extremities: Right BKA no stump edema no left lower extremity edema Dialysis Access: Left arm AV fistula patent on HD  OP dialysis Orders: Center: [facility] 4 hr 15 min 180NRe 450/700 105 kg 2.0 K/ 2.0 Ca UFP 4 AVF - Heparin 7000 units IV Initial bolus Heparin 2000  units IV mid run - Hectorol 7 mcg IV three times per week     Problem/Plan: AMS= appearing more alert this a.m. neurology workup, ?infection etiology improving after antibiotics, noted cefepime stopped 12/25 per primary    ESRD - MWF. Has had HD 12/24, 12/25, 12/26.  HD schedule today next HD on Monday in hospital.  Next HD 12/27 short treatment then resume MWF schedule 12/30/2024NSTEMI-work up per cardiology/primary.  Hyperkalemia-K6.3 on 12/25 resolved with HD K4.1 today  Fever/Hypotension- BC pending. Lactic acid 2.1. Has been started on board s  HFrEF-attempt to optimize volume with dialysis. ECHO today with EF 30 to 35%. Cards following. Fluid restrictions and daily weights. /On midodrine 10 mg 3 times daily  Anemia  - HGB 14.7 no ESA needs outpatient or hospital follow HGB  Metabolic bone disease -corrected calcium elevated, phosphorus 6.5 continue binder Renvela Eyvonne Mechanic VDRA D/T ^ Ca  Nutrition -currently liquid /needs carb modified renal when advanced    DMT2-per primary   Lenny Pastel, PA-C [facility] Beeper  [phone] 11/14/2023,8:05 AM  LOS: 6 days   Labs: Basic Metabolic Panel: Recent Labs  Lab 11/12/23 0850 11/13/23 0816 11/14/23 0332  NA 132* 130* 129*  K 4.6 3.9 4.1  CL 93* 91* 91*  CO2 21* 23 20*  GLUCOSE 214* 258* 277*  BUN 35* 30* 40*  CREATININE 8.39* 7.21* 9.31*  CALCIUM 9.6 9.2 9.5  PHOS 6.0* 4.0 4.8*   Liver Function Tests: Recent Labs  Lab 11/08/23 1658 11/10/23 0359 11/10/23 1503 11/11/23 0407 11/12/23 0850 11/13/23 0816 11/14/23 0332  AST 44*  --  117*  --   --   --   --   ALT 34  --  67*  --   --   --   --   ALKPHOS 127*  --  135*  --   --   --   --   BILITOT 0.6  --  1.0  --   --   --   --   PROT 8.3*  --  9.3*  --   --   --   --   ALBUMIN 3.3*   < > 3.5   < > 3.1* 2.9* 2.7*   < > = values in this interval not displayed.   No results for input(s): "LIPASE", "AMYLASE" in the last 168 hours. No results for input(s): "AMMONIA" in the last 168 hours. CBC: Recent Labs  Lab 11/08/23 1658 11/09/23 0328 11/10/23 1503 11/11/23  0407 11/12/23 0921 11/13/23 0442 11/13/23 0816 11/14/23 0332  WBC 6.3   < > 9.6 10.7* 9.3 8.3 7.7 6.4  NEUTROABS 3.7  --  6.6  --   --   --   --   --   HGB 14.4   < > 15.3 14.7 14.4 15.4 15.0 14.7  HCT 44.6   < > 47.4 46.3 45.2 46.7 45.9 46.4  MCV 91.6   < > 89.4 91.3 89.7 88.1 89.1 88.5  PLT 176   < > 179 149* 155 166 149* 169   < > = values in this interval not displayed.   Cardiac Enzymes: No results for input(s): "CKTOTAL", "CKMB", "CKMBINDEX", "TROPONINI" in the last 168 hours. CBG: Recent Labs  Lab 11/12/23 1612 11/13/23 0744 11/13/23 1222 11/13/23 1620 11/13/23 2138  GLUCAP 338* 230* 250* 231* 367*    Studies/Results: EEG adult Result Date: 11/13/2023 Charlsie Quest, MD     11/13/2023  4:15 PM Patient Name: [name] [name] Sr. MRN: [id number] Epilepsy Attending: Charlsie Quest Referring Physician/Provider: Caryl Pina, MD Date: 11/13/2023 Duration: 22.35 mins Patient history: 62yo M with ams getting eeg  to evaluate for seizure Level of alertness: Awake/ lethargic AEDs during EEG study: None Technical aspects: This EEG study was done with scalp electrodes positioned according to the 10-20 International system of electrode placement. Electrical activity was reviewed with band pass filter of 1-70Hz , sensitivity of 7 uV/mm, display speed of 5mm/sec with a 60Hz  notched filter applied as appropriate. EEG data were recorded continuously and digitally stored.  Video monitoring was available and reviewed as appropriate. Description: No clear posterior dominant rhythm was seen. EEG showed continuous generalized predominantly 5 to 7 Hz  theta- slowing admixed with intermittent 2-3Hz  delta slowing, at times with triphasic morphology. Hyperventilation and photic stimulation were not performed.   ABNORMALITY - Continuous slow, generalized IMPRESSION: This study is suggestive of mild to moderate diffuse encephalopathy. No seizures or epileptiform discharges were seen throughout the recording. Priyanka Annabelle Harman   Medications:  acyclovir 450 mg (11/13/23 1802)   albumin human     ampicillin (OMNIPEN) IV 2 g (11/13/23 2043)   cefTRIAXone (ROCEPHIN)  IV 2 g (11/13/23 2222)   heparin 1,350 Units/hr (11/13/23 2345)   vancomycin Stopped (11/12/23 1153)    aspirin  81 mg Oral Daily   atorvastatin  40 mg Oral Daily   Chlorhexidine Gluconate Cloth  6 each Topical Daily   cinacalcet  30 mg Oral Q breakfast   doxercalciferol  6 mcg Intravenous Q M,W,F-HD   insulin aspart  0-6 Units Subcutaneous TID WC   insulin glargine-yfgn  12 Units Subcutaneous Daily   midodrine  10 mg Oral TID WC   mupirocin ointment  1 Application Nasal BID   sevelamer carbonate  4,000 mg Oral TID WC   sodium zirconium cyclosilicate  10 g Oral Daily

## 2023-11-14 NOTE — Progress Notes (Signed)
OT Cancellation Note  Patient Details Name: [name] PLOTTS Sr. MRN: [id number] DOB: 06/12/1961   Cancelled Treatment:    Reason Eval/Treat Not Completed: Patient at procedure or test/ unavailable (Pt was signed up to work with therapy and rehab tech, tech reports pt not in room. OT will follow-up with pt as able)  11/14/2023  AB, OTR/L  Acute Rehabilitation Services  Office: [phone]   Tristan Schroeder 11/14/2023, 9:59 AM

## 2023-11-14 NOTE — Progress Notes (Signed)
   11/14/23 1227  Vitals  Temp 98.3 F (36.8 C)  Pulse Rate 84  Resp (!) 22  BP 109/75  SpO2 100 %  O2 Device Room Air  Post Treatment  Dialyzer Clearance Lightly streaked  Liters Processed 72.8  Fluid Removed (mL) 300 mL  Tolerated HD Treatment Yes  AVG/AVF Arterial Site Held (minutes) 10 minutes  AVG/AVF Venous Site Held (minutes) 10 minutes   Received patient in bed to unit.  Alert and oriented.  Informed consent signed and in chart.   TX duration:  Patient tolerated well.  Transported back to the room  Alert, without acute distress.  Hand-off given to patient's nurse.   Access used: L AVF Access issues: High venous pressure  Total UF removed: 3000 Medication(s) given: See MAR    Laqueta Due, RN Kid[facility] D[facility] Unit

## 2023-11-14 NOTE — Progress Notes (Signed)
[facility] PROGRESS NOTE  [name] KILBOURN Sr. (DOB: Nov 23, 1960) QQV:[id number] PCP: Pcp, No  Brief Narrative: [name] Lance Sr. is a 62 y.o. male with medical history significant of ESRD on HD, (MWF) heart failure, T2DM, sp right BKA and obesity who presented after experiencing a near syncope during hemodialysis. Recent hospitalization 12/06 to 12/10 for uncontrolled hyperglycemia. Patient was having HD on 12/22 when he was noted to have chest pain and hypotension, along with a near syncope episode. Apparently treatment was stopped and EMS was called. He wss found hypotensive and was transported to the ED. His blood pressure was initially low in the ED 75/59, but improved to 106/72. He was found to have very elevated high sensitive troponin and cardiology was consulted for evaluation.  He was diagnosed with NSTEMI and plan to admit patient to [facility] under cardiology. Pt was transferred to [facility] for further evaluation.   Remained with fevers and leukocytosis, started empirically on meningitis antimicrobials with neurology consulting.   Subjective: No complaints, seen after HD, denies any pain.   Objective: BP 109/75   Pulse 84   Temp 98.3 F (36.8 C)   Resp (!) 22   Ht 6\' 1"  (1.854 m)   Wt 100 kg   SpO2 100%   BMI 29.09 kg/m   Gen: No distress Pulm: Clear, nonlabored  CV: RRR no edema GI: Soft, NT, ND, +BS  Neuro: Alert and slow to react, asks what day it is. Ext: Warm, no deformities Skin: No new rashes, lesions or ulcers on visualized skin   EEG 12/27: Continuous, slow generalized abnormality consistent with mild-moderate diffuse encephalopathy   Assessment & Plan: NSTEMI:  - Continue heparin gtt, ASA, statin (LDL is 39). No BB w/hypotension.  - LHC and RHC planned per cardiology, delayed due to fevers and encephalopathy (both improving now)    ESRD:  - Continue HD per nephrology, treatment 12/28 on TTS schedule. - Renvela 4000 mg PO tid  - Sensipar 30 mg PO daily -  Midodrine 10 mg PO tid, getting albumin prn more low BP.   Acute on chronic systolic CHF: Cardiology following, possible concern for low flow low gradient severe AS.  - Manage volume with HD.    Fever w/ acute metabolic encephalopathy: Concern for encephalitis/meningitis, though no LP with heparin infusion. ?cefepime contributing, has been stopped and mentation improving as well.  - IV acyclovir 450 mg daily  - IV ampicillin 2 g q12  - IV ceftriaxone 2 g q12  - IV vancomycin 1g w/ HD  - Appreciate neurology consultation.     IDT2DM: With hyperglycemia, ongoing. HbA1c is >15.5%.  - Increase basal to 12u qHS and intensity of SSI  History of CVA  - Continue ASA, statin. Neuroimaging changes noted, may be reversible per neurology.    Mild protein-calorie malnutrition: - Complicating care    Obesity (BMI 30-39.9) - Complicating care   Tyrone Nine, MD [facility] [email or web address] 11/14/2023, 3:35 PM

## 2023-11-14 NOTE — Consult Note (Signed)
PHARMACY - ANTICOAGULATION CONSULT NOTE  Pharmacy Consult for Heparin infusion Indication: chest pain/ACS + AFib  Allergies  Allergen Reactions   Sulfa Antibiotics Diarrhea and Nausea And Vomiting    Patient Measurements: Height: 6\' 1"  (185.4 cm) Weight: 102.9 kg (226 lb 13.7 oz) IBW/kg (Calculated) : 79.9 Heparin Dosing Weight: 103.9 kg  Vital Signs: Temp: 97.8 F (36.6 C) (12/28 0745) Temp Source: Oral (12/28 0418) BP: 153/87 (12/28 0955) Pulse Rate: 85 (12/28 0955)  Labs: Recent Labs    11/12/23 0425 11/12/23 0850 11/12/23 0921 11/13/23 0442 11/13/23 0816 11/14/23 0332  HGB  --   --    < > 15.4 15.0 14.7  HCT  --   --    < > 46.7 45.9 46.4  PLT  --   --    < > 166 149* 169  HEPARINUNFRC 0.48  --   --  0.61  --  0.36  CREATININE  --  8.39*  --   --  7.21* 9.31*   < > = values in this interval not displayed.    Estimated Creatinine Clearance: 10.5 mL/min (A) (by C-G formula based on SCr of 9.31 mg/dL (H)).   Medical History: Past Medical History:  Diagnosis Date   [name] on hemodialysis (HCC)    a. HD MWF > 10 years.   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 01/2022 Echo: EF 45-50%, glob HK, mod LVH, mildly reduced RV fxn, mild MR, mod-sev MV Ca2+. Mild AS; b. 10/2023 Echo: EF 45-50%, glob HK, GrII DD, mildly reduced RV fxn, RVSP 52.24mmHg. Mild MR/MS, mod TR.   Hypertension    Nonischemic cardiomyopathy (HCC)    a. 2019 Cath [facility]): Nonobstructive disease; b. 12/2019 Echo: EF 50%; c. 12/2019 MV (Tx w/u [facility]): Ant infarct w/ peri-infarct ischemia. EF 35%; c. 05/2021 MV (Tx w/u [facility]): Cor Ca2+, EF 53%, motion artifact, equiv study, likely low risk.   Nonobstructive coronary artery disease    a.  2019 Cath ([facility]): Left main normal, LAD 30 proximal, RCA 30 distal, EF 25%.   Obesity    Type 2 diabetes mellitus (HCC)    a. 10/2023 - admission w/ HHS.     Assessment: [name] [name] admitted with CP and AFL. Pharmacy to dose IV  heparin.  12/28 AM: Heparin level 0.36, therapeutic on 1350 units/hr. No issues with heparin infusion running or signs of bleeding per RN. CBC stable (Hgb 14.7, PLT 169).   Goal of Therapy:  Heparin level 0.3-0.7 units/ml Monitor platelets by anticoagulation protocol: Yes   Plan:  Continue heparin at 1350 units/hr Monitor daily CBC, heparin level and s/sx of bleeding   Thank you for involving pharmacy in the patient's care.   Enos Fling, PharmD PGY-1 Acute Care Pharmacy Resident 11/14/2023 10:16 AM

## 2023-11-15 DIAGNOSIS — I350 Nonrheumatic aortic (valve) stenosis: Secondary | ICD-10-CM

## 2023-11-15 DIAGNOSIS — I214 Non-ST elevation (NSTEMI) myocardial infarction: Secondary | ICD-10-CM | POA: Diagnosis not present

## 2023-11-15 LAB — GLUCOSE, CAPILLARY
Glucose-Capillary: 210 mg/dL — ABNORMAL HIGH (ref 70–99)
Glucose-Capillary: 152 mg/dL — ABNORMAL HIGH (ref 70–99)
Glucose-Capillary: 188 mg/dL — ABNORMAL HIGH (ref 70–99)
Glucose-Capillary: 135 mg/dL — ABNORMAL HIGH (ref 70–99)

## 2023-11-15 LAB — RENAL FUNCTION PANEL
Albumin: 3.1 g/dL — ABNORMAL LOW (ref 3.5–5.0)
Anion gap: 18 — ABNORMAL HIGH (ref 5–15)
BUN: 28 mg/dL — ABNORMAL HIGH (ref 8–23)
CO2: 22 mmol/L (ref 22–32)
Calcium: 9.7 mg/dL (ref 8.9–10.3)
Chloride: 90 mmol/L — ABNORMAL LOW (ref 98–111)
Creatinine, Ser: 7.77 mg/dL — ABNORMAL HIGH (ref 0.61–1.24)
GFR, Estimated: 7 mL/min — ABNORMAL LOW (ref >60)
Glucose, Bld: 184 mg/dL — ABNORMAL HIGH (ref 70–99)
Phosphorus: 4.1 mg/dL (ref 2.5–4.6)
Potassium: 3.6 mmol/L (ref 3.5–5.1)
Sodium: 130 mmol/L — ABNORMAL LOW (ref 135–145)

## 2023-11-15 LAB — CULTURE, BLOOD (ROUTINE X 2)
Culture: NO GROWTH
Culture: NO GROWTH

## 2023-11-15 LAB — CBC
HCT: 48.1 % (ref 39.0–52.0)
Hemoglobin: 15.7 g/dL (ref 13.0–17.0)
MCH: 28.5 pg (ref 26.0–34.0)
MCHC: 32.6 g/dL (ref 30.0–36.0)
MCV: 87.5 fL (ref 80.0–100.0)
Platelets: 212 10*3/uL (ref 150–400)
RBC: 5.5 MIL/uL (ref 4.22–5.81)
RDW: 14.7 % (ref 11.5–15.5)
WBC: 6.5 10*3/uL (ref 4.0–10.5)
nRBC: 1.1 % — ABNORMAL HIGH (ref 0.0–0.2)

## 2023-11-15 LAB — HEPARIN LEVEL (UNFRACTIONATED): Heparin Unfractionated: 0.36 [IU]/mL (ref 0.30–0.70)

## 2023-11-15 MED ORDER — VANCOMYCIN HCL IN DEXTROSE 1-5 GM/200ML-% IV SOLN
1000.0000 mg | INTRAVENOUS | Status: DC
  Filled 2023-11-15 (×2): qty 200

## 2023-11-15 NOTE — Progress Notes (Signed)
Pharmacy Antibiotic Note  [name] [name]. is a 62 y.o. male with ESRD on HD admitted on 11/08/2023 with sepsis.  Pharmacy has been consulted for vancomycin, ceftriaxone, ampicillin, and acyclovir dosing for possible meningitis. Last HD session on 12/28 AM, off schedule due to the holidays. Last dose of vancomycin given at 1107 on 12/28. Next HD due 12/30.  WBC down to normal, patient remains afebrile. Bcx remain no growth.   Plan: Vancomycin 1000 mg after HD (goal pre-HD level 15-25 mcg/ml) Ceftriaxone 2 g IV q12h Ampicillin 2 g IV q12h Acyclovir 450 mg IV q24h (5 mg/kg ABW for BMI >30) Monitor HD schedule, clinical progress, cultures/sensitivities F/U LOT and de-escalate as able Vancomycin levels as clinically indicated    Height: 6\' 1"  (185.4 cm) Weight: 102.2 kg (225 lb 5 oz) IBW/kg (Calculated) : 79.9  Temp (24hrs), Avg:98 F (36.7 C), Min:97.6 F (36.4 C), Max:98.4 F (36.9 C)  Recent Labs  Lab 11/11/23 0001 11/11/23 0407 11/11/23 1647 11/12/23 0850 11/12/23 0921 11/13/23 0442 11/13/23 0816 11/14/23 0332 11/14/23 1654 11/15/23 0316  WBC  --  10.7*  --   --    < > 8.3 7.7 6.4 5.2 6.5  CREATININE  --  10.09* 11.53* 8.39*  --   --  7.21* 9.31*  --  7.77*  LATICACIDVEN 2.1* 2.1*  --   --   --   --   --   --   --   --    < > = values in this interval not displayed.    Estimated Creatinine Clearance: 12.5 mL/min (A) (by C-G formula based on SCr of 7.77 mg/dL (H)).    Allergies  Allergen Reactions   Sulfa Antibiotics Diarrhea and Nausea And Vomiting    Antimicrobials this admission: Vancomycin 12/24 >>  Cefepime 12/24 >> 12/25 Metronidazole 12/25 >> 12/25 Zosyn 12/25 >> 12/26 Ceftriaxone 12/26 >> Ampicillin 12/26 >> Acyclovir 12/26 >>  Dose adjustments this admission:  Microbiology results: 12/25 sputum cx: sent  12/24 Bcx: ngtd  12/23 MRSA PCR positive    Thank you for involving pharmacy in this patient's care.  [name] [name], PharmD PGY-1 Acute  Care Pharmacy Resident 11/15/2023 8:30 AM

## 2023-11-15 NOTE — Progress Notes (Signed)
Subjective: Seen in room, said tolerated dialysis yesterday /, no chest pain or shortness of breath / minimally confused today, not quite sure what day it is, aware he is in hospital  Objective Vital signs in last 24 hours: Vitals:   11/14/23 1952 11/15/23 0337 11/15/23 0500 11/15/23 0743  BP: 109/60 104/60  114/68  Pulse: 80 76  80  Resp: (!) 22 20  20   Temp: 98.4 F (36.9 C) 97.8 F (36.6 C)  97.6 F (36.4 C)  TempSrc: Oral Oral  Oral  SpO2: 100% 100%  94%  Weight:   102.2 kg   Height:       Weight change: 0 kg  Physical Exam: General: Alert /adult male NAD/minimal/pleasant confusion  Heart: RRR, 2 /6 sem, no rub or gallop Lungs: CTA bilateral nonlabored breathing Abdomen: BS soft NTND Extremities: Right BKA no stump edema no left lower extremity edema Dialysis Access: Left arm AV fistula positive bruit   OP dialysis Orders: Center: [facility] 4 hr 15 min 180NRe 450/700 105 kg 2.0 K/ 2.0 Ca UFP 4 AVF - Heparin 7000 units IV Initial bolus Heparin 2000  units IV mid run - Hectorol 7 mcg IV three times per week     Problem/Plan: AMS= alert this a.m., aware he is in hospital, almost normal MS, neurology workup, ?infection etiology improving after antibiotics, noted cefepime stopped 12/25 per primary and noted cefepime has been etiology of AMS in ESRD patients   ESRD - MWF. Has had HD 12/24, 12/25, 12/26, 12/28.  HD  next HD on Monday 12/30  NSTEMI-work up per cardiology/primary.  Hyperkalemia-K6.3 on 12/25 resolved with HD K 3.6 today  Fever/Hypotension- BC remain no growth so far. Has been started on board spectrum antibiotics  HFrEF/history of severe AS-attempt to optimize volume with dialysis. ECHO  EF 30 to 35%. Cards following. Fluid restrictions and daily weights. /On midodrine 10 mg 3 times daily  Anemia  - HGB 15.7 no ESA needs outpatient or hospital follow HGB  Metabolic bone disease -corrected calcium elevated, phosphorus 4.1 continue binder Renvela  Sonny Masters VDRA D/T ^ Ca  Nutrition -currently liquid /needs carb modified renal when advanced    DMT2-per primary  Lenny Pastel, PA-C [facility] [phone] 11/15/2023,10:06 AM  LOS: 7 days   Labs: Basic Metabolic Panel: Recent Labs  Lab 11/13/23 0816 11/14/23 0332 11/15/23 0316  NA 130* 129* 130*  K 3.9 4.1 3.6  CL 91* 91* 90*  CO2 23 20* 22  GLUCOSE 258* 277* 184*  BUN 30* 40* 28*  CREATININE 7.21* 9.31* 7.77*  CALCIUM 9.2 9.5 9.7  PHOS 4.0 4.8* 4.1   Liver Function Tests: Recent Labs  Lab 11/08/23 1658 11/10/23 0359 11/10/23 1503 11/11/23 0407 11/13/23 0816 11/14/23 0332 11/15/23 0316  AST 44*  --  117*  --   --   --   --   ALT 34  --  67*  --   --   --   --   ALKPHOS 127*  --  135*  --   --   --   --   BILITOT 0.6  --  1.0  --   --   --   --   PROT 8.3*  --  9.3*  --   --   --   --   ALBUMIN 3.3*   < > 3.5   < > 2.9* 2.7* 3.1*   < > = values in this interval not displayed.  No results for input(s): "LIPASE", "AMYLASE" in the last 168 hours. No results for input(s): "AMMONIA" in the last 168 hours. CBC: Recent Labs  Lab 11/08/23 1658 11/09/23 0328 11/10/23 1503 11/11/23 0407 11/13/23 0442 11/13/23 0816 11/14/23 0332 11/14/23 1654 11/15/23 0316  WBC 6.3   < > 9.6   < > 8.3 7.7 6.4 5.2 6.5  NEUTROABS 3.7  --  6.6  --   --   --   --   --   --   HGB 14.4   < > 15.3   < > 15.4 15.0 14.7 16.0 15.7  HCT 44.6   < > 47.4   < > 46.7 45.9 46.4 49.9 48.1  MCV 91.6   < > 89.4   < > 88.1 89.1 88.5 88.9 87.5  PLT 176   < > 179   < > 166 149* 169 167 212   < > = values in this interval not displayed.   Cardiac Enzymes: No results for input(s): "CKTOTAL", "CKMB", "CKMBINDEX", "TROPONINI" in the last 168 hours. CBG: Recent Labs  Lab 11/13/23 1620 11/13/23 2138 11/14/23 1635 11/14/23 2153 11/15/23 0740  GLUCAP 231* 367* 290* 178* 210*    Studies/Results: EEG adult Result Date: 11/13/2023 Charlsie Quest, MD     11/13/2023  4:15 PM  Patient Name: [name] BROD Sr. MRN: [id number] Epilepsy Attending: Charlsie Quest Referring Physician/Provider: Caryl Pina, MD Date: 11/13/2023 Duration: 22.35 mins Patient history: 62yo M with ams getting eeg to evaluate for seizure Level of alertness: Awake/ lethargic AEDs during EEG study: None Technical aspects: This EEG study was done with scalp electrodes positioned according to the 10-20 International system of electrode placement. Electrical activity was reviewed with band pass filter of 1-70Hz , sensitivity of 7 uV/mm, display speed of 65mm/sec with a 60Hz  notched filter applied as appropriate. EEG data were recorded continuously and digitally stored.  Video monitoring was available and reviewed as appropriate. Description: No clear posterior dominant rhythm was seen. EEG showed continuous generalized predominantly 5 to 7 Hz  theta- slowing admixed with intermittent 2-3Hz  delta slowing, at times with triphasic morphology. Hyperventilation and photic stimulation were not performed.   ABNORMALITY - Continuous slow, generalized IMPRESSION: This study is suggestive of mild to moderate diffuse encephalopathy. No seizures or epileptiform discharges were seen throughout the recording. Priyanka Annabelle Harman   Medications:  acyclovir 450 mg (11/14/23 1709)   ampicillin (OMNIPEN) IV 2 g (11/15/23 0839)   cefTRIAXone (ROCEPHIN)  IV 2 g (11/14/23 2132)   heparin 1,350 Units/hr (11/14/23 2130)   vancomycin 1,000 mg (11/14/23 1107)    aspirin  81 mg Oral Daily   atorvastatin  40 mg Oral Daily   cinacalcet  30 mg Oral Q breakfast   [START ON 11/16/2023] doxercalciferol  3 mcg Intravenous Q M,W,F-HD   heparin  5,000 Units Dialysis Once in dialysis   insulin aspart  0-5 Units Subcutaneous QHS   insulin aspart  0-9 Units Subcutaneous TID WC   insulin glargine-yfgn  12 Units Subcutaneous Daily   midodrine  10 mg Oral TID WC   sevelamer carbonate  4,000 mg Oral TID WC

## 2023-11-15 NOTE — Consult Note (Signed)
PHARMACY - ANTICOAGULATION CONSULT NOTE  Pharmacy Consult for Heparin infusion Indication: chest pain/ACS + AFib  Allergies  Allergen Reactions   Sulfa Antibiotics Diarrhea and Nausea And Vomiting    Patient Measurements: Height: 6\' 1"  (185.4 cm) Weight: 102.2 kg (225 lb 5 oz) IBW/kg (Calculated) : 79.9 Heparin Dosing Weight: 103.9 kg  Vital Signs: Temp: 97.6 F (36.4 C) (12/29 0743) Temp Source: Oral (12/29 0743) BP: 114/68 (12/29 0743) Pulse Rate: 80 (12/29 0743)  Labs: Recent Labs    11/13/23 0442 11/13/23 0816 11/14/23 0332 11/14/23 1654 11/15/23 0316  HGB 15.4 15.0 14.7 16.0 15.7  HCT 46.7 45.9 46.4 49.9 48.1  PLT 166 149* 169 167 212  HEPARINUNFRC 0.61  --  0.36  --  0.36  CREATININE  --  7.21* 9.31*  --  7.77*    Estimated Creatinine Clearance: 12.5 mL/min (A) (by C-G formula based on SCr of 7.77 mg/dL (H)).   Medical History: Past Medical History:  Diagnosis Date   ESRD on hemodialysis (HCC)    a. HD MWF > 10 years.   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 01/2022 Echo: EF 45-50%, glob HK, mod LVH, mildly reduced RV fxn, mild MR, mod-sev MV Ca2+. Mild AS; b. 10/2023 Echo: EF 45-50%, glob HK, GrII DD, mildly reduced RV fxn, RVSP 52.54mmHg. Mild MR/MS, mod TR.   Hypertension    Nonischemic cardiomyopathy (HCC)    a. 2019 Cath [facility]): Nonobstructive disease; b. 12/2019 Echo: EF 50%; c. 12/2019 MV (Tx w/u [facility]): Ant infarct w/ peri-infarct ischemia. EF 35%; c. 05/2021 MV (Tx w/u [facility]): Cor Ca2+, EF 53%, motion artifact, equiv study, likely low risk.   Nonobstructive coronary artery disease    a.  2019 Cath ([facility] [facility]): Left main normal, LAD 30 proximal, RCA 30 distal, EF 25%.   Obesity    Type 2 diabetes mellitus (HCC)    a. 10/2023 - admission w/ HHS.     Assessment: 63 yoM ESRD admitted with CP and AFL. Pharmacy to dose IV heparin.  12/29 AM: Heparin level 0.36, therapeutic on 1350 units/hr. No issues with heparin  infusion running or signs of bleeding per RN. CBC stable (Hgb 15.7, PLT 212).   Goal of Therapy:  Heparin level 0.3-0.7 units/ml Monitor platelets by anticoagulation protocol: Yes   Plan:  Continue heparin at 1350 units/hr Monitor daily CBC, heparin level and s/sx of bleeding   Thank you for involving pharmacy in the patient's care.   Enos Fling, PharmD PGY-1 Acute Care Pharmacy Resident 11/15/2023 8:23 AM

## 2023-11-15 NOTE — Progress Notes (Signed)
PROGRESS NOTE    [name] Lance Sr.  FAO:[id number] DOB: 31-Dec-1960 DOA: 11/08/2023 PCP: Pcp, No  Chief Complaint  Patient presents with   Hypotension   Cough   Shortness of Breath    Brief Narrative:   [name] Lance Sr. is [name] [name] 62 y.o. male with medical history significant of ESRD on HD, (MWF) heart failure, T2DM, sp right BKA and obesity who presented after experiencing [name] near syncope during hemodialysis. Recent hospitalization 12/06 to 12/10 for uncontrolled hyperglycemia. Patient was having HD on 12/22 when he was noted to have chest pain and hypotension, along with [name] [name] near syncope episode. Apparently treatment was stopped and EMS was called. He wss found hypotensive and was transported to the ED. His blood pressure was initially low in the ED 75/59, but improved to 106/72. He was found to have very elevated high sensitive troponin and cardiology was consulted for evaluation.   He was diagnosed with NSTEMI and plan to admit patient to [facility] under cardiology. Pt was transferred to [facility] for further evaluation.   Now on broad spectrum abx to cover for possible meningitis.     Assessment & Plan:   Principal Problem:   NSTEMI (non-ST elevated myocardial infarction) (HCC) Active Problems:   Sepsis with encephalopathy and septic shock (HCC)   ESRD on dialysis (HCC)   Hypotension   Acute on chronic systolic CHF (congestive heart failure) (HCC)   HTN (hypertension)   Acute metabolic encephalopathy   DM2 (diabetes mellitus, type 2) (HCC)   History of CVA    Protein-calorie malnutrition, mild (HCC)   Obesity (BMI 30-39.9)   Atrial flutter with rapid ventricular response (HCC)   Aortic valve stenosis  NSTEMI:  - Continue heparin gtt, ASA, statin (LDL is 39). No BB w/hypotension.  - cards note from 12/28 with plans for [facility], no earlier than Monday pending improvement with mental status and fever curve   ESRD:  - Continue HD per nephrology, treatment 12/28 on TTS schedule. -  Renvela 4000 mg PO tid  - Sensipar 30 mg PO daily - Midodrine 10 mg PO tid, getting albumin prn more low BP.   Acute on chronic systolic CHF: Cardiology following, possible concern for low flow low gradient severe AS.  - Manage volume with HD.  - echo with EF 30-35%, regional wall motion abnormalities, mildly reduced RVSF (EF decreased, possible severe aortic stenosis) - volume per renal    Fever w/ acute metabolic encephalopathy: Concern for encephalitis/meningitis, though no LP with heparin infusion. ?cefepime contributing, has been stopped and mentation improving as well.  - MRI brain with ill defined restricted diffusion within splenium of the corpus callosum.  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  Ddx includes seizures, metabolic disturbance, infection.  Demyelination, infarct, PRES less likely.   - EEG with mild to diffuse encephalopathy - IV acyclovir 450 mg daily  - IV ampicillin 2 g q12  - IV ceftriaxone 2 g q12  - IV vancomycin 1g w/ HD  - Appreciate neurology consultation. "Overall presentation most c/w septic encephalopathy".  Meningitis [name] [name] consideration.  Empiric meningitis dose antibiotics including ampicillin.  Will discuss overall plan with neurology.     IDT2DM: With hyperglycemia, ongoing. HbA1c is >15.5% 10/24/2023. - Increase basal to 12u qHS and intensity of SSI   History of CVA  - Continue ASA, statin   Mild protein-calorie malnutrition: - Complicating care    Obesity (BMI 30-39.9) - Complicating care     DVT prophylaxis: heparin Code  Status: full Family Communication: none Disposition:   Status is: Inpatient Remains inpatient appropriate because: need for continued inpatient care   Consultants:  Neurology Cardiology   Procedures:  IMPRESSIONS     1. Left ventricular ejection fraction, by estimation, is 30 to 35%. The  left ventricle has moderately decreased function. The left ventricle  demonstrates regional wall motion abnormalities  (see scoring  diagram/findings for description). There is  moderate concentric left ventricular hypertrophy. Left ventricular  diastolic parameters are consistent with Grade III diastolic dysfunction  (restrictive).   2. Right ventricular systolic function is mildly reduced. The right  ventricular size is normal. There is mildly elevated pulmonary artery  systolic pressure. The estimated right ventricular systolic pressure is  42.2 mmHg.   3. Left atrial size was moderately dilated.   4. The mitral valve is degenerative. Trivial mitral valve regurgitation.  Moderate mitral annular calcification.   5. The aortic valve is tricuspid. There is moderate calcification of the  aortic valve. Aortic valve regurgitation is not visualized. Moderate to  severe aortic valve stenosis - suspect upper end of scale, low flow/low  gradient. Aortic valve area, by VTI  measures 0.98 cm. Aortic valve mean gradient measures 22.0 mmHg.  Dimentionless index 0.19.   6. Aortic dilatation noted. There is borderline dilatation of the aortic  root, measuring 40 mm.   7. The inferior vena cava is dilated in size with <50% respiratory  variability, suggesting right atrial pressure of 15 mmHg.   Comparison(s): Prior images reviewed side by side. LVEF has decreased,  30-35% range with basal inferoposterior akinesis and restrictive diastolic  filling. Aortic stenosis possibly severe (low flow/low gradient). Mildly  elevated RVSP.   EEG IMPRESSION: This study is suggestive of mild to moderate diffuse encephalopathy.  No seizures or epileptiform discharges were seen throughout the recording.  Antimicrobials:  Anti-infectives (From admission, onward)    Start     Dose/Rate Route Frequency Ordered Stop   11/12/23 2300  vancomycin (VANCOCIN) IVPB 1000 mg/200 mL premix        1,000 mg 200 mL/hr over 60 Minutes Intravenous  Once 11/12/23 2226 11/13/23 0131   11/12/23 2000  cefTRIAXone (ROCEPHIN) 2 g in sodium  chloride 0.9 % 100 mL IVPB        2 g 200 mL/hr over 30 Minutes Intravenous Every 12 hours 11/12/23 1627     11/12/23 2000  ampicillin (OMNIPEN) 2 g in sodium chloride 0.9 % 100 mL IVPB        2 g 300 mL/hr over 20 Minutes Intravenous Every 12 hours 11/12/23 1627     11/12/23 1730  acyclovir (ZOVIRAX) 450 mg in dextrose 5 % 100 mL IVPB        5 mg/kg  89.6 kg (Adjusted) 109 mL/hr over 60 Minutes Intravenous Every 24 hours 11/12/23 1627     11/11/23 1915  piperacillin-tazobactam (ZOSYN) IVPB 2.25 g  Status:  Discontinued        2.25 g 100 mL/hr over 30 Minutes Intravenous Every 8 hours 11/11/23 1825 11/12/23 1615   11/11/23 1245  metroNIDAZOLE (FLAGYL) IVPB 500 mg  Status:  Discontinued        500 mg 100 mL/hr over 60 Minutes Intravenous Every 12 hours 11/11/23 1146 11/11/23 1825   11/11/23 1200  vancomycin (VANCOCIN) IVPB 1000 mg/200 mL premix        1,000 mg 200 mL/hr over 60 Minutes Intravenous Every M-W-F (Hemodialysis) 11/10/23 1458     11/10/23 1545  vancomycin (VANCOCIN) IVPB 1000 mg/200 mL premix       Placed in "Followed by" Linked Group   1,000 mg 200 mL/hr over 60 Minutes Intravenous  Once 11/10/23 1458 11/10/23 1917   11/10/23 1545  vancomycin (VANCOCIN) IVPB 1000 mg/200 mL premix       Placed in "Followed by" Linked Group   1,000 mg 200 mL/hr over 60 Minutes Intravenous  Once 11/10/23 1458 11/10/23 1815   11/10/23 1545  ceFEPIme (MAXIPIME) 1 g in sodium chloride 0.9 % 100 mL IVPB  Status:  Discontinued        1 g 200 mL/hr over 30 Minutes Intravenous Every M-W-F 11/10/23 1458 11/10/23 1520   11/10/23 1545  ceFEPIme (MAXIPIME) 2 g in sodium chloride 0.9 % 100 mL IVPB  Status:  Discontinued        2 g 200 mL/hr over 30 Minutes Intravenous Every M-W-F 11/10/23 1520 11/11/23 1810       Subjective: Pleasantly confused  Objective: Vitals:   11/15/23 0337 11/15/23 0500 11/15/23 0743 11/15/23 1214  BP: 104/60  114/68 (!) 102/55  Pulse: 76  80 74  Resp: 20  20 20    Temp: 97.8 F (36.6 C)  97.6 F (36.4 C) 98.6 F (37 C)  TempSrc: Oral  Oral Oral  SpO2: 100%  94% 97%  Weight:  102.2 kg    Height:       No intake or output data in the 24 hours ending 11/15/23 1615 Filed Weights   11/14/23 0801 11/14/23 1231 11/15/23 0500  Weight: 102.9 kg 100 kg 102.2 kg    Examination:  General exam: Appears calm and comfortable  Respiratory system: unlabored Cardiovascular system:RRR Gastrointestinal system: Abdomen is nondistended, soft and nontender Central nervous system: Alert, but confused. No focal neurological deficits. Extremities: no LEE    Data Reviewed: I have personally reviewed following labs and imaging studies  CBC: Recent Labs  Lab 11/08/23 1658 11/09/23 0328 11/10/23 1503 11/11/23 0407 11/13/23 0442 11/13/23 0816 11/14/23 0332 11/14/23 1654 11/15/23 0316  WBC 6.3   < > 9.6   < > 8.3 7.7 6.4 5.2 6.5  NEUTROABS 3.7  --  6.6  --   --   --   --   --   --   HGB 14.4   < > 15.3   < > 15.4 15.0 14.7 16.0 15.7  HCT 44.6   < > 47.4   < > 46.7 45.9 46.4 49.9 48.1  MCV 91.6   < > 89.4   < > 88.1 89.1 88.5 88.9 87.5  PLT 176   < > 179   < > 166 149* 169 167 212   < > = values in this interval not displayed.    Basic Metabolic Panel: Recent Labs  Lab 11/11/23 1647 11/12/23 0850 11/13/23 0816 11/14/23 0332 11/15/23 0316  NA 127* 132* 130* 129* 130*  K 6.3* 4.6 3.9 4.1 3.6  CL 90* 93* 91* 91* 90*  CO2 21* 21* 23 20* 22  GLUCOSE 275* 214* 258* 277* 184*  BUN 56* 35* 30* 40* 28*  CREATININE 11.53* 8.39* 7.21* 9.31* 7.77*  CALCIUM 10.1 9.6 9.2 9.5 9.7  PHOS 7.2* 6.0* 4.0 4.8* 4.1    GFR: Estimated Creatinine Clearance: 12.5 mL/min (Jullie Arps) (by C-G formula based on SCr of 7.77 mg/dL (H)).  Liver Function Tests: Recent Labs  Lab 11/08/23 1658 11/10/23 2952 11/10/23 1503 11/11/23 0407 11/11/23 1647 11/12/23 8413 11/13/23 2440 11/14/23 0332 11/15/23 1027  AST 44*  --  117*  --   --   --   --   --   --   ALT 34  --  67*   --   --   --   --   --   --   ALKPHOS 127*  --  135*  --   --   --   --   --   --   BILITOT 0.6  --  1.0  --   --   --   --   --   --   PROT 8.3*  --  9.3*  --   --   --   --   --   --   ALBUMIN 3.3*   < > 3.5   < > 3.3* 3.1* 2.9* 2.7* 3.1*   < > = values in this interval not displayed.    CBG: Recent Labs  Lab 11/14/23 1635 11/14/23 2153 11/15/23 0740 11/15/23 1211 11/15/23 1605  GLUCAP 290* 178* 210* 152* 188*     Recent Results (from the past 240 hours)  MRSA Next Gen by PCR, Nasal     Status: Abnormal   Collection Time: 11/09/23  4:15 PM   Specimen: Nasal Mucosa; Nasal Swab  Result Value Ref Range Status   MRSA by PCR Next Gen DETECTED (Lawrence Roldan) NOT DETECTED Final    Comment: RESULT CALLED TO, READ BACK BY AND VERIFIED WITH: C HALL,RN@[id number]  11/09/23 MK (NOTE) The GeneXpert MRSA Assay (FDA approved for NASAL specimens only), is one component of Ejay Lashley comprehensive MRSA colonization surveillance program. It is not intended to diagnose MRSA infection nor to guide or monitor treatment for MRSA infections. Test performance is not FDA approved in patients less than 43 years old. Performed at [facility], [address], [place], [place] [address]   Culture, blood (Routine X 2) w Reflex to ID Panel     Status: None   Collection Time: 11/10/23  3:03 PM   Specimen: BLOOD RIGHT ARM  Result Value Ref Range Status   Specimen Description BLOOD RIGHT ARM  Final   Special Requests   Final    BOTTLES DRAWN AEROBIC AND ANAEROBIC Blood Culture results may not be optimal due to an inadequate volume of blood received in culture bottles   Culture   Final    NO GROWTH 5 DAYS Performed at [facility], [address], [place], [place] [address]    Report Status 11/15/2023 FINAL  Final  Culture, blood (Routine X 2) w Reflex to ID Panel     Status: None   Collection Time: 11/10/23  3:08 PM   Specimen: BLOOD RIGHT ARM  Result Value Ref Range Status   Specimen Description BLOOD RIGHT  ARM  Final   Special Requests   Final    BOTTLES DRAWN AEROBIC ONLY Blood Culture results may not be optimal due to an inadequate volume of blood received in culture bottles   Culture   Final    NO GROWTH 5 DAYS Performed at [facility], [address], [place], [place] [address]    Report Status 11/15/2023 FINAL  Final  C Difficile Quick Screen w PCR reflex     Status: None   Collection Time: 11/12/23  8:37 AM   Specimen: STOOL  Result Value Ref Range Status   C Diff antigen NEGATIVE NEGATIVE Final   C Diff toxin NEGATIVE NEGATIVE Final   C Diff interpretation No C.  difficile detected.  Final    Comment: Performed at [facility], [address], [place], [place] [address]         Radiology Studies: No results found.      Scheduled Meds:  aspirin  81 mg Oral Daily   atorvastatin  40 mg Oral Daily   cinacalcet  30 mg Oral Q breakfast   [START ON 11/16/2023] doxercalciferol  3 mcg Intravenous Q M,W,F-HD   heparin  5,000 Units Dialysis Once in dialysis   insulin aspart  0-5 Units Subcutaneous QHS   insulin aspart  0-9 Units Subcutaneous TID WC   insulin glargine-yfgn  12 Units Subcutaneous Daily   midodrine  10 mg Oral TID WC   sevelamer carbonate  4,000 mg Oral TID WC   Continuous Infusions:  acyclovir 450 mg (11/14/23 1709)   ampicillin (OMNIPEN) IV 2 g (11/15/23 0839)   cefTRIAXone (ROCEPHIN)  IV 2 g (11/15/23 1056)   heparin 1,350 Units/hr (11/14/23 2130)   vancomycin 1,000 mg (11/14/23 1107)     LOS: 7 days    Time spent: over 30 min    Lacretia Nicks, MD [facility]   To contact the attending provider between 7A-7P or the covering provider during after hours 7P-7A, please log into the web site [email or web address] and access using universal [place] password for that web site. If you do not have the password, please call the hospital operator.  11/15/2023, 4:15 PM

## 2023-11-15 NOTE — Plan of Care (Signed)
  Problem: Clinical Measurements: Goal: Ability to maintain clinical measurements within normal limits will improve Outcome: Progressing Goal: Respiratory complications will improve Outcome: Progressing Goal: Cardiovascular complication will be avoided Outcome: Progressing   Problem: Safety: Goal: Ability to remain free from injury will improve Outcome: Progressing   Problem: Metabolic: Goal: Ability to maintain appropriate glucose levels will improve Outcome: Progressing

## 2023-11-15 NOTE — Progress Notes (Signed)
SLP Cancellation Note  Patient Details Name: [name] BAHNER Sr. MRN: [id number] DOB: 1961-07-12   Cancelled treatment:       Reason Eval/Treat Not Completed: Fatigue/lethargy limiting ability to participate. Pt rousing for seconds at a time and unable to coherently answer questions despite best efforts. Performance this date would not be representative of cognitive function. SLP will continue attempts to f/u to complete a cognitive evaluation as able.    Gwynneth Aliment, M.A., CF-SLP Speech Language Pathology, Acute Rehabilitation Services  Secure Chat preferred [phone]  11/15/2023, 3:09 PM

## 2023-11-15 NOTE — Plan of Care (Signed)

## 2023-11-16 ENCOUNTER — Inpatient Hospital Stay (HOSPITAL_COMMUNITY): Payer: Medicare Other

## 2023-11-16 DIAGNOSIS — I214 Non-ST elevation (NSTEMI) myocardial infarction: Secondary | ICD-10-CM | POA: Diagnosis not present

## 2023-11-16 LAB — RENAL FUNCTION PANEL
Albumin: 2.9 g/dL — ABNORMAL LOW (ref 3.5–5.0)
Anion gap: 21 — ABNORMAL HIGH (ref 5–15)
BUN: 41 mg/dL — ABNORMAL HIGH (ref 8–23)
CO2: 17 mmol/L — ABNORMAL LOW (ref 22–32)
Calcium: 9.8 mg/dL (ref 8.9–10.3)
Chloride: 94 mmol/L — ABNORMAL LOW (ref 98–111)
Creatinine, Ser: 9.7 mg/dL — ABNORMAL HIGH (ref 0.61–1.24)
GFR, Estimated: 6 mL/min — ABNORMAL LOW (ref >60)
Glucose, Bld: 146 mg/dL — ABNORMAL HIGH (ref 70–99)
Phosphorus: 4.6 mg/dL (ref 2.5–4.6)
Potassium: 3.9 mmol/L (ref 3.5–5.1)
Sodium: 132 mmol/L — ABNORMAL LOW (ref 135–145)

## 2023-11-16 LAB — BLOOD GAS, VENOUS
Acid-Base Excess: 0.3 mmol/L (ref 0.0–2.0)
Bicarbonate: 23.5 mmol/L (ref 20.0–28.0)
Drawn by: 8222
O2 Saturation: 87.8 %
Patient temperature: 37
pCO2, Ven: 33 mm[Hg] — ABNORMAL LOW (ref 44–60)
pH, Ven: 7.46 — ABNORMAL HIGH (ref 7.25–7.43)
pO2, Ven: 54 mm[Hg] — ABNORMAL HIGH (ref 32–45)

## 2023-11-16 LAB — HEPARIN LEVEL (UNFRACTIONATED): Heparin Unfractionated: 0.28 [IU]/mL — ABNORMAL LOW (ref 0.30–0.70)

## 2023-11-16 LAB — CBC
HCT: 51 % (ref 39.0–52.0)
Hemoglobin: 16.7 g/dL (ref 13.0–17.0)
MCH: 28.6 pg (ref 26.0–34.0)
MCHC: 32.7 g/dL (ref 30.0–36.0)
MCV: 87.5 fL (ref 80.0–100.0)
Platelets: 273 10*3/uL (ref 150–400)
RBC: 5.83 MIL/uL — ABNORMAL HIGH (ref 4.22–5.81)
RDW: 15 % (ref 11.5–15.5)
WBC: 7.9 10*3/uL (ref 4.0–10.5)
nRBC: 1.7 % — ABNORMAL HIGH (ref 0.0–0.2)

## 2023-11-16 LAB — GLUCOSE, CAPILLARY
Glucose-Capillary: 162 mg/dL — ABNORMAL HIGH (ref 70–99)
Glucose-Capillary: 171 mg/dL — ABNORMAL HIGH (ref 70–99)
Glucose-Capillary: 153 mg/dL — ABNORMAL HIGH (ref 70–99)
Glucose-Capillary: 194 mg/dL — ABNORMAL HIGH (ref 70–99)

## 2023-11-16 LAB — HIV ANTIBODY (ROUTINE TESTING W REFLEX): HIV Screen 4th Generation wRfx: NONREACTIVE

## 2023-11-16 LAB — VITAMIN B12: Vitamin B-12: 2058 pg/mL — ABNORMAL HIGH (ref 180–914)

## 2023-11-16 LAB — AMMONIA: Ammonia: 36 umol/L — ABNORMAL HIGH (ref 9–35)

## 2023-11-16 LAB — FOLATE: Folate: 16.6 ng/mL (ref >5.9)

## 2023-11-16 NOTE — Progress Notes (Signed)
PROGRESS NOTE    [name] Lance Sr.  VZD:[id number] DOB: 28-Nov-1960 DOA: 11/08/2023 PCP: Pcp, No  Chief Complaint  Patient presents with   Hypotension   Cough   Shortness of Breath    Brief Narrative:   [name] Lance Sr. is [name] [name] 62 y.o. male with medical history significant of ESRD on HD, (MWF) heart failure, T2DM, sp right BKA and obesity who presented after experiencing [name] [name] near syncope during hemodialysis. Recent hospitalization 12/06 to 12/10 for uncontrolled hyperglycemia. Patient was having HD on 12/22 when he was noted to have chest pain and hypotension, along with [name] [name] near syncope episode. Apparently treatment was stopped and EMS was called. He wss found hypotensive and was transported to the ED. His blood pressure was initially low in the ED 75/59, but improved to 106/72. He was found to have very elevated high sensitive troponin and cardiology was consulted for evaluation.   He was diagnosed with NSTEMI and plan to admit patient to [facility] under cardiology. Pt was transferred to [facility] for further evaluation.   Now on broad spectrum abx to cover for possible meningitis.     Assessment & Plan:   Principal Problem:   NSTEMI (non-ST elevated myocardial infarction) (HCC) Active Problems:   Sepsis with encephalopathy and septic shock (HCC)   ESRD on dialysis (HCC)   Hypotension   Acute on chronic systolic CHF (congestive heart failure) (HCC)   HTN (hypertension)   Acute metabolic encephalopathy   DM2 (diabetes mellitus, type 2) (HCC)   History of CVA    Protein-calorie malnutrition, mild (HCC)   Obesity (BMI 30-39.9)   Atrial flutter with rapid ventricular response (HCC)   Aortic valve stenosis  NSTEMI:  - Continue heparin gtt, ASA, statin (LDL is 39). No BB w/hypotension.  - cards note from 12/28 with plans for [facility], no earlier than Monday pending improvement with mental status and fever curve - appreciate further cards recs   ESRD:  - Continue HD per nephrology,  treatment 12/28 on TTS schedule. - Renvela 4000 mg PO tid  - Sensipar 30 mg PO daily - Midodrine 10 mg PO tid, getting albumin prn more low BP.   Acute on chronic systolic CHF: Cardiology following, possible concern for low flow low gradient severe AS.  - Manage volume with HD.  - echo with EF 30-35%, regional wall motion abnormalities, mildly reduced RVSF (EF decreased, possible severe aortic stenosis) - volume per renal    Fever w/ acute metabolic encephalopathy: Concern for encephalitis/meningitis, though no LP with heparin infusion. ?cefepime contributing, has been stopped and mentation improving as well.  - MRI brain with ill defined restricted diffusion within splenium of the corpus callosum.  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  Ddx includes seizures, metabolic disturbance, infection.  Demyelination, infarct, PRES less likely.   - EEG with mild to diffuse encephalopathy - IV acyclovir 450 mg daily  - IV ampicillin 2 g q12  - IV ceftriaxone 2 g q12  - IV vancomycin 1g w/ HD  - Appreciate neurology consultation. "Overall presentation most c/w septic encephalopathy".  Meningitis [name] [name] consideration.  Empiric meningitis dose antibiotics including ampicillin.  Will discuss overall plan with neurology -> recommending continuing meningitis coverage to completion. - lethargy noted today, seems that he's waxing/waning, continued delirium -> VBG, ammonia, b12, folate, RPR    Diarrhea - negative C diff, rectal tube was removed today  IDT2DM: With hyperglycemia, ongoing. HbA1c is >15.5% 10/24/2023. - Increase basal to 12u qHS and  intensity of SSI   History of CVA  - Continue ASA, statin   Mild protein-calorie malnutrition: - Complicating care    Obesity (BMI 30-39.9) - Complicating care     DVT prophylaxis: heparin Code Status: full Family Communication: none Disposition:   Status is: Inpatient Remains inpatient appropriate because: need for continued inpatient care    Consultants:  Neurology Cardiology   Procedures:  IMPRESSIONS     1. Left ventricular ejection fraction, by estimation, is 30 to 35%. The  left ventricle has moderately decreased function. The left ventricle  demonstrates regional wall motion abnormalities (see scoring  diagram/findings for description). There is  moderate concentric left ventricular hypertrophy. Left ventricular  diastolic parameters are consistent with Grade III diastolic dysfunction  (restrictive).   2. Right ventricular systolic function is mildly reduced. The right  ventricular size is normal. There is mildly elevated pulmonary artery  systolic pressure. The estimated right ventricular systolic pressure is  42.2 mmHg.   3. Left atrial size was moderately dilated.   4. The mitral valve is degenerative. Trivial mitral valve regurgitation.  Moderate mitral annular calcification.   5. The aortic valve is tricuspid. There is moderate calcification of the  aortic valve. Aortic valve regurgitation is not visualized. Moderate to  severe aortic valve stenosis - suspect upper end of scale, low flow/low  gradient. Aortic valve area, by VTI  measures 0.98 cm. Aortic valve mean gradient measures 22.0 mmHg.  Dimentionless index 0.19.   6. Aortic dilatation noted. There is borderline dilatation of the aortic  root, measuring 40 mm.   7. The inferior vena cava is dilated in size with <50% respiratory  variability, suggesting right atrial pressure of 15 mmHg.   Comparison(s): Prior images reviewed side by side. LVEF has decreased,  30-35% range with basal inferoposterior akinesis and restrictive diastolic  filling. Aortic stenosis possibly severe (low flow/low gradient). Mildly  elevated RVSP.   EEG IMPRESSION: This study is suggestive of mild to moderate diffuse encephalopathy.  No seizures or epileptiform discharges were seen throughout the recording.  Antimicrobials:  Anti-infectives (From admission, onward)     Start     Dose/Rate Route Frequency Ordered Stop   11/16/23 1200  vancomycin (VANCOCIN) IVPB 1000 mg/200 mL premix        1,000 mg 200 mL/hr over 60 Minutes Intravenous Every M-W-F (Hemodialysis) 11/15/23 1658 11/23/23 1159   11/12/23 2300  vancomycin (VANCOCIN) IVPB 1000 mg/200 mL premix        1,000 mg 200 mL/hr over 60 Minutes Intravenous  Once 11/12/23 2226 11/13/23 0131   11/12/23 2000  cefTRIAXone (ROCEPHIN) 2 g in sodium chloride 0.9 % 100 mL IVPB        2 g 200 mL/hr over 30 Minutes Intravenous Every 12 hours 11/12/23 1627 11/23/23 2359   11/12/23 2000  ampicillin (OMNIPEN) 2 g in sodium chloride 0.9 % 100 mL IVPB        2 g 300 mL/hr over 20 Minutes Intravenous Every 12 hours 11/12/23 1627 11/23/23 2359   11/12/23 1730  acyclovir (ZOVIRAX) 450 mg in dextrose 5 % 100 mL IVPB        5 mg/kg  89.6 kg (Adjusted) 109 mL/hr over 60 Minutes Intravenous Every 24 hours 11/12/23 1627 11/22/23 1729   11/11/23 1915  piperacillin-tazobactam (ZOSYN) IVPB 2.25 g  Status:  Discontinued        2.25 g 100 mL/hr over 30 Minutes Intravenous Every 8 hours 11/11/23 1825 11/12/23 1615   11/11/23  1245  metroNIDAZOLE (FLAGYL) IVPB 500 mg  Status:  Discontinued        500 mg 100 mL/hr over 60 Minutes Intravenous Every 12 hours 11/11/23 1146 11/11/23 1825   11/11/23 1200  vancomycin (VANCOCIN) IVPB 1000 mg/200 mL premix  Status:  Discontinued        1,000 mg 200 mL/hr over 60 Minutes Intravenous Every M-W-F (Hemodialysis) 11/10/23 1458 11/15/23 1658   11/10/23 1545  vancomycin (VANCOCIN) IVPB 1000 mg/200 mL premix       Placed in "Followed by" Linked Group   1,000 mg 200 mL/hr over 60 Minutes Intravenous  Once 11/10/23 1458 11/10/23 1917   11/10/23 1545  vancomycin (VANCOCIN) IVPB 1000 mg/200 mL premix       Placed in "Followed by" Linked Group   1,000 mg 200 mL/hr over 60 Minutes Intravenous  Once 11/10/23 1458 11/10/23 1815   11/10/23 1545  ceFEPIme (MAXIPIME) 1 g in sodium chloride 0.9 % 100  mL IVPB  Status:  Discontinued        1 g 200 mL/hr over 30 Minutes Intravenous Every M-W-F 11/10/23 1458 11/10/23 1520   11/10/23 1545  ceFEPIme (MAXIPIME) 2 g in sodium chloride 0.9 % 100 mL IVPB  Status:  Discontinued        2 g 200 mL/hr over 30 Minutes Intravenous Every M-W-F 11/10/23 1520 11/11/23 1810       Subjective: Lethargic, doesn't interact much   Objective: Vitals:   11/15/23 2003 11/16/23 0425 11/16/23 0721 11/16/23 1157  BP: 115/69 127/73 99/69 119/73  Pulse:  78 80 81  Resp: 18  19 19   Temp: 97.8 F (36.6 C) 97.6 F (36.4 C) (!) 97.5 F (36.4 C) (!) 97.4 F (36.3 C)  TempSrc: Oral Oral Oral Oral  SpO2:   94% 94%  Weight:  101 kg    Height:        Intake/Output Summary (Last 24 hours) at 11/16/2023 1413 Last data filed at 11/16/2023 0700 Gross per 24 hour  Intake 1036.08 ml  Output --  Net 1036.08 ml   Filed Weights   11/14/23 1231 11/15/23 0500 11/16/23 0425  Weight: 100 kg 102.2 kg 101 kg    Examination:  General: ill appearing Cardiovascular: RRR Lungs: unlabored S/nt/nd Neurological: lethargic, opens eyes to voice, then doesn't interact much  Extremities: No clubbing or cyanosis. No edema.  Data Reviewed: I have personally reviewed following labs and imaging studies  CBC: Recent Labs  Lab 11/10/23 1503 11/11/23 0407 11/13/23 0816 11/14/23 0332 11/14/23 1654 11/15/23 0316 11/16/23 0402  WBC 9.6   < > 7.7 6.4 5.2 6.5 7.9  NEUTROABS 6.6  --   --   --   --   --   --   HGB 15.3   < > 15.0 14.7 16.0 15.7 16.7  HCT 47.4   < > 45.9 46.4 49.9 48.1 51.0  MCV 89.4   < > 89.1 88.5 88.9 87.5 87.5  PLT 179   < > 149* 169 167 212 273   < > = values in this interval not displayed.    Basic Metabolic Panel: Recent Labs  Lab 11/12/23 0850 11/13/23 0816 11/14/23 0332 11/15/23 0316 11/16/23 0402  NA 132* 130* 129* 130* 132*  K 4.6 3.9 4.1 3.6 3.9  CL 93* 91* 91* 90* 94*  CO2 21* 23 20* 22 17*  GLUCOSE 214* 258* 277* 184* 146*  BUN  35* 30* 40* 28* 41*  CREATININE 8.39* 7.21* 9.31* 7.77*  9.70*  CALCIUM 9.6 9.2 9.5 9.7 9.8  PHOS 6.0* 4.0 4.8* 4.1 4.6    GFR: Estimated Creatinine Clearance: 10 mL/min (Steed Kanaan) (by C-G formula based on SCr of 9.7 mg/dL (H)).  Liver Function Tests: Recent Labs  Lab 11/10/23 1503 11/11/23 0407 11/12/23 0850 11/13/23 0816 11/14/23 0332 11/15/23 0316 11/16/23 0402  AST 117*  --   --   --   --   --   --   ALT 67*  --   --   --   --   --   --   ALKPHOS 135*  --   --   --   --   --   --   BILITOT 1.0  --   --   --   --   --   --   PROT 9.3*  --   --   --   --   --   --   ALBUMIN 3.5   < > 3.1* 2.9* 2.7* 3.1* 2.9*   < > = values in this interval not displayed.    CBG: Recent Labs  Lab 11/15/23 1211 11/15/23 1605 11/15/23 2201 11/16/23 0717 11/16/23 1144  GLUCAP 152* 188* 135* 162* 171*     Recent Results (from the past 240 hours)  MRSA Next Gen by PCR, Nasal     Status: Abnormal   Collection Time: 11/09/23  4:15 PM   Specimen: Nasal Mucosa; Nasal Swab  Result Value Ref Range Status   MRSA by PCR Next Gen DETECTED (Ellysia Char) NOT DETECTED Final    Comment: RESULT CALLED TO, READ BACK BY AND VERIFIED WITH: C HALL,RN@[id number]  11/09/23 MK (NOTE) The GeneXpert MRSA Assay (FDA approved for NASAL specimens only), is one component of Isiaah Cuervo comprehensive MRSA colonization surveillance program. It is not intended to diagnose MRSA infection nor to guide or monitor treatment for MRSA infections. Test performance is not FDA approved in patients less than 64 years old. Performed at [facility], [address] [place], [place] [address]   Culture, blood (Routine X 2) w Reflex to ID Panel     Status: None   Collection Time: 11/10/23  3:03 PM   Specimen: BLOOD RIGHT ARM  Result Value Ref Range Status   Specimen Description BLOOD RIGHT ARM  Final   Special Requests   Final    BOTTLES DRAWN AEROBIC AND ANAEROBIC Blood Culture results may not be optimal due to an inadequate volume of blood  received in culture bottles   Culture   Final    NO GROWTH 5 DAYS Performed at [facility], [address] [place], [place] [address]    Report Status 11/15/2023 FINAL  Final  Culture, blood (Routine X 2) w Reflex to ID Panel     Status: None   Collection Time: 11/10/23  3:08 PM   Specimen: BLOOD RIGHT ARM  Result Value Ref Range Status   Specimen Description BLOOD RIGHT ARM  Final   Special Requests   Final    BOTTLES DRAWN AEROBIC ONLY Blood Culture results may not be optimal due to an inadequate volume of blood received in culture bottles   Culture   Final    NO GROWTH 5 DAYS Performed at [facility], [address] [place], [place] [address]    Report Status 11/15/2023 FINAL  Final  C Difficile Quick Screen w PCR reflex     Status: None   Collection Time: 11/12/23  8:37 AM  Specimen: STOOL  Result Value Ref Range Status   C Diff antigen NEGATIVE NEGATIVE Final   C Diff toxin NEGATIVE NEGATIVE Final   C Diff interpretation No C. difficile detected.  Final    Comment: Performed at [facility], [address], [place], [place] [address]         Radiology Studies: No results found.      Scheduled Meds:  aspirin  81 mg Oral Daily   atorvastatin  40 mg Oral Daily   cinacalcet  30 mg Oral Q breakfast   doxercalciferol  3 mcg Intravenous Q M,W,F-HD   heparin  5,000 Units Dialysis Once in dialysis   insulin aspart  0-5 Units Subcutaneous QHS   insulin aspart  0-9 Units Subcutaneous TID WC   insulin glargine-yfgn  12 Units Subcutaneous Daily   midodrine  10 mg Oral TID WC   sevelamer carbonate  4,000 mg Oral TID WC   Continuous Infusions:  acyclovir 450 mg (11/15/23 1754)   ampicillin (OMNIPEN) IV 2 g (11/16/23 0858)   cefTRIAXone (ROCEPHIN)  IV 2 g (11/16/23 1258)   heparin 1,350 Units/hr (11/16/23 1306)   vancomycin       LOS: 8 days    Time spent: over 30 min    Lacretia Nicks, MD [facility]   To contact the attending  provider between 7A-7P or the covering provider during after hours 7P-7A, please log into the web site [email or web address] and access using universal [place] password for that web site. If you do not have the password, please call the hospital operator.  11/16/2023, 2:13 PM

## 2023-11-16 NOTE — Progress Notes (Addendum)
Rounding Note    Patient Name: [name] AWAD Sr. Date of Encounter: 11/16/2023  [facility] Cardiologist: None [facility]  Subjective   Patient awake and answers some simple questions, but is very lethargic and yawns frequently.  He denies chest pain or shortness of breath  No fevers in 24 hours  Inpatient Medications    Scheduled Meds:  aspirin  81 mg Oral Daily   atorvastatin  40 mg Oral Daily   cinacalcet  30 mg Oral Q breakfast   doxercalciferol  3 mcg Intravenous Q M,W,F-HD   heparin  5,000 Units Dialysis Once in dialysis   insulin aspart  0-5 Units Subcutaneous QHS   insulin aspart  0-9 Units Subcutaneous TID WC   insulin glargine-yfgn  12 Units Subcutaneous Daily   midodrine  10 mg Oral TID WC   sevelamer carbonate  4,000 mg Oral TID WC   Continuous Infusions:  acyclovir 450 mg (11/15/23 1754)   ampicillin (OMNIPEN) IV 2 g (11/15/23 2017)   cefTRIAXone (ROCEPHIN)  IV 2 g (11/15/23 2143)   heparin 1,350 Units/hr (11/15/23 1828)   vancomycin     PRN Meds: acetaminophen **OR** acetaminophen, heparin, lidocaine (PF), lidocaine-prilocaine, ondansetron **OR** ondansetron (ZOFRAN) IV, mouth rinse, mouth rinse, mouth rinse, pentafluoroprop-tetrafluoroeth, sevelamer carbonate   Vital Signs    Vitals:   11/15/23 1737 11/15/23 2003 11/16/23 0425 11/16/23 0721  BP: 118/73 115/69 127/73 99/69  Pulse: 77  78 80  Resp: 20 18  19   Temp: 97.9 F (36.6 C) 97.8 F (36.6 C) 97.6 F (36.4 C) (!) 97.5 F (36.4 C)  TempSrc: Oral Oral Oral Oral  SpO2: 97%   94%  Weight:   101 kg   Height:        Intake/Output Summary (Last 24 hours) at 11/16/2023 1884 Last data filed at 11/16/2023 0700 Gross per 24 hour  Intake 1036.08 ml  Output --  Net 1036.08 ml      11/16/2023    4:25 AM 11/15/2023    5:00 AM 11/14/2023   12:31 PM  Last 3 Weights  Weight (lbs) 222 lb 10.6 oz 225 lb 5 oz 220 lb 7.4 oz  Weight (kg) 101 kg 102.2 kg 100 kg      Telemetry    Sinus  rhythm, PVCs and pairs- personally Reviewed  ECG    None today- Personally Reviewed  Physical Exam   General: Elderly, chronically ill-appearing, male in no acute distress Head: Eyes PERRLA, Head normocephalic and atraumatic Lungs: Clear anteriorly. Heart: HRRR S1 S2, without rub or gallop.  2/6 murmur.  Upper extremity pulses are 2+ & equal. No JVD seen, patient is supine. Abdomen: Bowel sounds are present, abdomen soft and non-tender without masses or  hernias noted. Msk: Not able to be tested Extremities: No clubbing, cyanosis or edema. S/p R BKA Skin:  No rashes or lesions noted. Neuro: Alert and oriented X 1  Labs    High Sensitivity Troponin:   Recent Labs  Lab 11/09/23 1111 11/09/23 1314 11/10/23 0907 11/10/23 1503 11/11/23 0001  TROPONINIHS 8,442* 8,849* 8,942* 7,282* 7,672*     Chemistry Recent Labs  Lab 11/10/23 1503 11/11/23 0407 11/14/23 0332 11/15/23 0316 11/16/23 0402  NA 130*   < > 129* 130* 132*  K 5.9*   < > 4.1 3.6 3.9  CL 89*   < > 91* 90* 94*  CO2 19*   < > 20* 22 17*  GLUCOSE 196*   < > 277* 184* 146*  BUN 32*   < > 40* 28* 41*  CREATININE 8.80*   < > 9.31* 7.77* 9.70*  CALCIUM 9.7   < > 9.5 9.7 9.8  PROT 9.3*  --   --   --   --   ALBUMIN 3.5   < > 2.7* 3.1* 2.9*  AST 117*  --   --   --   --   ALT 67*  --   --   --   --   ALKPHOS 135*  --   --   --   --   BILITOT 1.0  --   --   --   --   GFRNONAA 6*   < > 6* 7* 6*  ANIONGAP 22*   < > 18* 18* 21*   < > = values in this interval not displayed.    Lipids  Recent Labs  Lab 11/13/23 0816  CHOL 104  TRIG 158*  HDL 33*  LDLCALC 39  CHOLHDL 3.2    Hematology Recent Labs  Lab 11/14/23 1654 11/15/23 0316 11/16/23 0402  WBC 5.2 6.5 7.9  RBC 5.61 5.50 5.83*  HGB 16.0 15.7 16.7  HCT 49.9 48.1 51.0  MCV 88.9 87.5 87.5  MCH 28.5 28.5 28.6  MCHC 32.1 32.6 32.7  RDW 14.7 14.7 15.0  PLT 167 212 273   Thyroid No results for input(s): "TSH", "FREET4" in the last 168 hours.  BNPNo  results for input(s): "BNP", "PROBNP" in the last 168 hours.  DDimer No results for input(s): "DDIMER" in the last 168 hours.   Radiology    No results found.   Cardiac Studies   Echo 11/09/2023  1. Left ventricular ejection fraction, by estimation, is 30 to 35%. The left ventricle has moderately decreased function. The left ventricle demonstrates regional wall motion abnormalities (see scoring diagram/findings for description). There is  moderate concentric left ventricular hypertrophy. Left ventricular  diastolic parameters are consistent with Grade III diastolic dysfunction (restrictive).   2. Right ventricular systolic function is mildly reduced. The right  ventricular size is normal. There is mildly elevated pulmonary artery systolic pressure. The estimated right ventricular systolic pressure is 42.2 mmHg.   3. Left atrial size was moderately dilated.   4. The mitral valve is degenerative. Trivial mitral valve regurgitation. Moderate mitral annular calcification.   5. The aortic valve is tricuspid. There is moderate calcification of the aortic valve. Aortic valve regurgitation is not visualized. Moderate to severe aortic valve stenosis - suspect upper end of scale, low flow/low gradient. Aortic valve area, by VTI  measures 0.98 cm. Aortic valve mean gradient measures 22.0 mmHg. Dimentionless index 0.19.   6. Aortic dilatation noted. There is borderline dilatation of the aortic  root, measuring 40 mm.   7. The inferior vena cava is dilated in size with <50% respiratory  variability, suggesting right atrial pressure of 15 mmHg.   Comparison(s): Prior images reviewed side by side. LVEF has decreased, 30-35% range with basal inferoposterior akinesis and restrictive diastolic filling. Aortic stenosis possibly severe (low flow/low gradient). Mildly elevated RVSP.    Patient Profile     62 y.o. male with PMH of NICM, nonobstructive CAD, HTN, HLD, DM II, PAD s/p R BKA, and ESRD on HD for  10+ years presented 12/23 with NSTEMI, HTN and presyncope. On arrival BP 75/59  Assessment & Plan    NSTEMI -Peak troponin 8942 - Echo 11/10/2023 showed EF 30-35%, basal inferoposterior akinesis and restrictive diastolic filling, likely low-flow low gradient  severe aortic stenosis with low flow and low gradient grade 3 DD.  - originally planned for left and right heart cath to assess CAD and severe AS, procedure has been delayed by his poor mental status, for over 24 hours and now being treated for possible meningitis  -He has been on IV heparin continuously since 12/25 -No reports of chest pain, discuss with MD if we can DC this -Continue aspirin and statin  CAD:  - previously underwent cath in [facility] 2019 due to LV dysfunction with EF of 25%, cath showed 30% stenosis in prox LAD and distal RCA, medically managed.  - stress test in Feb 2021 as part of transplant eval, this showed anterior infarct with peri-infarct ischemia, finding was suspicious for artifact. No repeat cath. Echo in March 2023 showed EF 45-50% with global hypokinesis.  -Continue aspirin 81 mg daily, atorvastatin 40 mg daily -No beta-blocker due to hypotension -Plan for right and left heart cath once mental status issues improved  HTN -He was not on any medications for his high blood pressure at home -Now hypotensive possibly related to sepsis -SBP range 99-127 in the last 24 hours -Continue midodrine 10 mg 3 times daily  HLD - FLP results above, they are improved from 1 yr ago -Continue atorvastatin 40 mg daily  ESRD on HD:  -been on dialysis for 10+ years, followed by [facility] nephrology.  -Urgent dialysis 11/11/2023 for K+ > 7.5 - continued management of volume and renal issues per Nephrology  6.  Fever/septic encephalopathy -LP not performed because patient on heparin drip for non-STEMI -Now on empiric antibiotic treatment for possible meningitis -Neurology following  I spent 35 minutes caring for this  patient today face to face, ordering and reviewing labs, reviewing records from this hospitalization including neurology consult yesterday , seeing the patient, documenting in the record  For questions or updates, please contact [facility] Please consult [email or web address] for contact info under        Signed, Theodore Demark, PA-C  11/16/2023, 8:03 AM    Patient seen and examined with RB PA-C.  Agree as above, with the following exceptions and changes as noted below. Undergoing patient care at time of my visit, eyes closed appears somnolent. Gen: NAD, CV: RRR, no murmurs, Lungs: clear, Abd: soft, Extrem: Warm, well perfused, no edema, Neuro/Psych: somnolent All available labs, radiology testing, previous records reviewed. Defer cath until mental status improves. Remainder as above.   Parke Poisson, MD 11/16/23 11:59 AM

## 2023-11-16 NOTE — Progress Notes (Signed)
Pt's provider updated that the pt is refusing his noon medications, refusing to have his flexiseal replaced as well as refusing meals. Pt is swatting the nursing staff away. Pt's provider is up now visiting & assessed the pt at bedside. RN will continue to monitor the pt. Sanda Linger, RN

## 2023-11-16 NOTE — Evaluation (Signed)
Speech Language Pathology Evaluation Patient Details Name: [name] [name] [name]. MRN: [id number] DOB: 1961/08/03 Today's Date: 11/16/2023 Time: 4010-2725 SLP Time Calculation (min) (ACUTE ONLY): 8 min  Problem List:  Patient Active Problem List   Diagnosis Date Noted   Aortic valve stenosis 11/15/2023   Atrial flutter with rapid ventricular response (HCC) 11/10/2023   NSTEMI (non-ST elevated myocardial infarction) (HCC) 11/08/2023   Hyperglycemia 10/24/2023   Abnormal EKG 10/24/2023   RUQ abdominal pain 10/30/2022   Cholelithiasis 10/29/2022   Hyperbilirubinemia 10/29/2022   Volume overload 10/19/2022   CHF (congestive heart failure) (HCC) 10/18/2022   Hyperosmolar hyperglycemic state (HHS) (HCC)    Diarrhea    Dehiscence of amputation stump of right lower extremity (HCC)    Abscess of right lower leg    Pressure injury of skin 07/02/2022   Cellulitis of right leg 07/01/2022   CAD (coronary artery disease) 05/24/2022   Leukocytosis 05/24/2022   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/23/2022   Obesity (BMI 30-39.9) 02/19/2022   Hypotension 02/08/2022   History of CVA  02/05/2022   Carotid stenosis 02/05/2022   Hyperkalemia 02/03/2022   acute on chronic Anemia of chronic renal failure  02/03/2022   Subacute osteomyelitis, right ankle and foot (HCC) 02/02/2022   Acute metabolic encephalopathy 02/02/2022   Sepsis with encephalopathy and septic shock (HCC) 02/02/2022   Protein-calorie malnutrition, mild (HCC) 02/02/2022   GERD (gastroesophageal reflux disease) 02/02/2022   Acute on chronic systolic CHF (congestive heart failure) (HCC) 08/14/2019   LBBB (left bundle branch block) 08/13/2019   Problem with vascular access 08/13/2019   Dialysis AV fistula malfunction (HCC) 08/12/2019   ESRD on dialysis (HCC) 09/20/2018   DM2 (diabetes mellitus, type 2) (HCC) 09/20/2018   HTN (hypertension) 09/20/2018   Past Medical History:  Past Medical History:  Diagnosis  Date   ESRD on hemodialysis (HCC)    a. HD MWF > 10 years.   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 01/2022 Echo: EF 45-50%, glob HK, mod LVH, mildly reduced RV fxn, mild MR, mod-sev MV Ca2+. Mild AS; b. 10/2023 Echo: EF 45-50%, glob HK, GrII DD, mildly reduced RV fxn, RVSP 52.76mmHg. Mild MR/MS, mod TR.   Hypertension    Nonischemic cardiomyopathy (HCC)    a. 2019 Cath [facility]): Nonobstructive disease; b. 12/2019 Echo: EF 50%; c. 12/2019 MV (Tx w/u [facility]): Ant infarct w/ peri-infarct ischemia. EF 35%; c. 05/2021 MV (Tx w/u [facility]): Cor Ca2+, EF 53%, motion artifact, equiv study, likely low risk.   Nonobstructive coronary artery disease    a.  2019 Cath ([place]): Left main normal, LAD 30 proximal, RCA 30 distal, EF 25%.   Obesity    Type 2 diabetes mellitus (HCC)    a. 10/2023 - admission w/ HHS.   Past Surgical History:  Past Surgical History:  Procedure Laterality Date   A/V FISTULAGRAM N/A 08/15/2019   Procedure: A/V FISTULAGRAM;  Surgeon: Maeola Harman, MD;  Location: [facility] INVASIVE CV LAB;  Service: Cardiovascular;  Laterality: N/A;   AMPUTATION Right 02/02/2022   Procedure: AMPUTATION 5th RAY;  Surgeon: Louann Sjogren, MD;  [facility]: [facility] OR;  Service: Podiatry;  Laterality: Right;   AMPUTATION Right 05/23/2022   Procedure: RIGHT BELOW KNEE AMPUTATION;  Surgeon: Nadara Mustard, MD;  Location: [facility] OR;  Service: Orthopedics;  Laterality: Right;   AMPUTATION Right 07/04/2022   Procedure: REVISION AMPUTATION BELOW KNEE;  Surgeon: Nadara Mustard, MD;  Location: [facility]  OR;  Service: Orthopedics;  Laterality: Right;   APPLICATION OF WOUND VAC  05/23/2022   Procedure: APPLICATION OF WOUND VAC;  Surgeon: Nadara Mustard, MD;  Location: [facility] OR;  Service: Orthopedics;;   AV FISTULA PLACEMENT     CARDIAC CATHETERIZATION  02/17/2018   LHC 02/17/18 ([name]): 30% pLAD, 30% dRCA, LVEDP 25-30, LVEF 25%.     CATARACT EXTRACTION W/PHACO Right 03/29/2020    Procedure: CATARACT EXTRACTION PHACO AND INTRAOCULAR LENS PLACEMENT (IOC)  RIGHT DIABETIC;  Surgeon: Lockie Mola, MD;  Location: [facility] ORS;  Service: Ophthalmology;  Laterality: Right;  Lot # [id number] H [place]: 02:30.9 AP% 9.5% CDE: 14.38   EYE SURGERY Right    cataract removed   I & D EXTREMITY Right 02/02/2022   Procedure: IRRIGATION AND DEBRIDEMENT RIGHT FOOT;  Surgeon: Louann Sjogren, MD;  Location: [facility] OR;  Service: Podiatry;  Laterality: Right;   INCISION AND DRAINAGE OF WOUND Right 02/08/2022   Procedure: IRRIGATION AND DEBRIDEMENT WOUND;  Surgeon: Asencion Islam, DPM;  Location: [facility] OR;  Service: Podiatry;  Laterality: Right;  with removal of infected bone    INSERTION OF DIALYSIS CATHETER  08/13/2019   Procedure: Insertion Of Dialysis Catheter;  Surgeon: Larina Earthly, MD;  Location: [facility] OR;  Service: Vascular;;   INSERTION OF DIALYSIS CATHETER Left 07/19/2020   Procedure: INSERTION OF DIALYSIS CATHETER;  Surgeon: Maeola Harman, MD;  Location: [facility] OR;  Service: Vascular;  Laterality: Left;   IR US GUIDE BX ASP/DRAIN  11/07/2022   IRRIGATION AND DEBRIDEMENT ABSCESS     PERIPHERAL VASCULAR BALLOON ANGIOPLASTY  08/15/2019   Procedure: PERIPHERAL VASCULAR BALLOON ANGIOPLASTY;  Surgeon: Maeola Harman, MD;  Location: [facility] INVASIVE CV LAB;  Service: Cardiovascular;;   PERIPHERAL VASCULAR BALLOON ANGIOPLASTY Left 02/06/2022   Procedure: PERIPHERAL VASCULAR BALLOON ANGIOPLASTY;  Surgeon: Cephus Shelling, MD;  [facility] INVASIVE CV LAB;  Service: Cardiovascular;  Laterality: Left;   REVISON OF ARTERIOVENOUS FISTULA Left 07/19/2020   Procedure: REVISON/PLICATION OF ARTERIOVENOUS FISTULA LEFT;  Surgeon: Maeola Harman, MD;  Location: [facility] OR;  Service: Vascular;  Laterality: Left;   THROMBECTOMY AND REVISION OF ARTERIOVENTOUS (AV) GORETEX  GRAFT Left 08/13/2019   Procedure: THROMBECTOMY OF LEFT ARM ARTERIOVENTOUS (AV) FISTULA;  Surgeon: Larina Earthly, MD;  [facility] OR;  Service: Vascular;  Laterality: Left;   TOE AMPUTATION     HPI:  [name] [name] is a 62 yo male presenting to ED 12/22 after experiencing near syncope, chest pain, and hypotension during HD. Admitted with NSTEMI. Recently admitted 12/6-12/10 for a fall and uncontrolled hyperglycemia. PMH includes ESRD on HD, CHF, T2DM s/p R BKA, obesity   Assessment / Plan / Recommendation Clinical Impression  No family present to confirm pt's PLOF, although suspect pt is experiencing acute cognitive changes. His presentation is further affected by lethargy. Pt perseverative on phrases used by SLP throughout the session with very spontaneous verbalizations of his own. His intelligibilty is slightly reduced at the sentence level. Suspect he is experiencing deficits related to processing and executive functioning. He did not respond to attempts at reasoning and frequently acted conflictingly by shaking his head "no" when asked if he wanted more food while simultaneously opening his mouth to accept PO presentations. His focused attention varies but he is responsive to verbal and tactile stimuli, just not for prolonged periods of time. SLP will continue following to target deficits listed above.    SLP Assessment  SLP Recommendation/Assessment: Patient needs continued Speech Lanaguage  Pathology Services SLP Visit Diagnosis: Dysarthria and anarthria (R47.1);Cognitive communication deficit (R41.841)    Recommendations for follow up therapy are one component of a multi-disciplinary discharge planning process, led by the attending physician.  Recommendations may be updated based on patient status, additional functional criteria and insurance authorization.    Follow Up Recommendations  Skilled nursing-short term rehab (<3 hours/day)    Assistance Recommended at Discharge  Frequent or constant Supervision/Assistance  Functional Status Assessment Patient has had a recent decline in their functional status and  demonstrates the ability to make significant improvements in function in a reasonable and predictable amount of time.  Frequency and Duration min 2x/week  2 weeks      SLP Evaluation Cognition  Overall Cognitive Status: Impaired/Different from baseline Arousal/Alertness: Lethargic Orientation Level: Oriented to person;Disoriented to place;Disoriented to time;Disoriented to situation Attention: Focused Focused Attention: Impaired Focused Attention Impairment: Verbal basic;Functional basic Awareness: Impaired Awareness Impairment: Intellectual impairment Problem Solving: Impaired Problem Solving Impairment: Functional basic Executive Function: Reasoning       Comprehension  Auditory Comprehension Overall Auditory Comprehension: Impaired Commands: Impaired One Step Basic Commands: 25-49% accurate    Expression Expression Primary Mode of Expression: Verbal Verbal Expression Overall Verbal Expression: Impaired   Oral / Motor  Oral Motor/Sensory Function Overall Oral Motor/Sensory Function: Within functional limits Motor Speech Overall Motor Speech: Impaired Respiration: Within functional limits Phonation: Normal Resonance: Within functional limits Articulation: Impaired Level of Impairment: Sentence Intelligibility: Intelligibility reduced Sentence: 75-100% accurate            Gwynneth Aliment, M.A., CF-SLP Speech Language Pathology, Acute Rehabilitation Services  Secure Chat preferred [phone]  11/16/2023, 4:31 PM

## 2023-11-16 NOTE — Progress Notes (Signed)
[facility] Progress Note   Subjective:   Patient seen in room. Alert but seems a bit confused, slow to respond to questions. Denies SOB, CP, dizziness, nausea.   Objective Vitals:   11/15/23 1737 11/15/23 2003 11/16/23 0425 11/16/23 0721  BP: 118/73 115/69 127/73 99/69  Pulse: 77  78 80  Resp: 20 18  19   Temp: 97.9 F (36.6 C) 97.8 F (36.6 C) 97.6 F (36.4 C) (!) 97.5 F (36.4 C)  TempSrc: Oral Oral Oral Oral  SpO2: 97%   94%  Weight:   101 kg   Height:       Physical Exam General: Alert male in NAD Heart: RRR, no murmurs, rubs or gallops Lungs: CTA bilaterally, respirations unlabored on RA Abdomen: Soft, non-distended ,+BS Extremities: No edema b/l lower extremities, R BKA Dialysis Access:  LUE AVF + T/b  Additional Objective Labs: Basic Metabolic Panel: Recent Labs  Lab 11/14/23 0332 11/15/23 0316 11/16/23 0402  NA 129* 130* 132*  K 4.1 3.6 3.9  CL 91* 90* 94*  CO2 20* 22 17*  GLUCOSE 277* 184* 146*  BUN 40* 28* 41*  CREATININE 9.31* 7.77* 9.70*  CALCIUM 9.5 9.7 9.8  PHOS 4.8* 4.1 4.6   Liver Function Tests: Recent Labs  Lab 11/10/23 1503 11/11/23 0407 11/14/23 0332 11/15/23 0316 11/16/23 0402  AST 117*  --   --   --   --   ALT 67*  --   --   --   --   ALKPHOS 135*  --   --   --   --   BILITOT 1.0  --   --   --   --   PROT 9.3*  --   --   --   --   ALBUMIN 3.5   < > 2.7* 3.1* 2.9*   < > = values in this interval not displayed.   No results for input(s): "LIPASE", "AMYLASE" in the last 168 hours. CBC: Recent Labs  Lab 11/10/23 1503 11/11/23 0407 11/13/23 0816 11/14/23 0332 11/14/23 1654 11/15/23 0316 11/16/23 0402  WBC 9.6   < > 7.7 6.4 5.2 6.5 7.9  NEUTROABS 6.6  --   --   --   --   --   --   HGB 15.3   < > 15.0 14.7 16.0 15.7 16.7  HCT 47.4   < > 45.9 46.4 49.9 48.1 51.0  MCV 89.4   < > 89.1 88.5 88.9 87.5 87.5  PLT 179   < > 149* 169 167 212 273   < > = values in this interval not displayed.   Blood Culture     Component Value Date/Time   SDES BLOOD RIGHT ARM 11/10/2023 1508   SPECREQUEST  11/10/2023 1508    BOTTLES DRAWN AEROBIC ONLY Blood Culture results may not be optimal due to an inadequate volume of blood received in culture bottles   CULT  11/10/2023 1508    NO GROWTH 5 DAYS Performed at [facility], [address] [place], [place] [address]    REPTSTATUS 11/15/2023 FINAL 11/10/2023 1508    Cardiac Enzymes: No results for input(s): "CKTOTAL", "CKMB", "CKMBINDEX", "TROPONINI" in the last 168 hours. CBG: Recent Labs  Lab 11/15/23 0740 11/15/23 1211 11/15/23 1605 11/15/23 2201 11/16/23 0717  GLUCAP 210* 152* 188* 135* 162*   Iron Studies: No results for input(s): "IRON", "TIBC", "TRANSFERRIN", "FERRITIN" in the last 72 hours. @lablastinr3 @ Studies/Results: No results found. Medications:  acyclovir 450 mg (  11/15/23 1754)   ampicillin (OMNIPEN) IV 2 g (11/16/23 0858)   cefTRIAXone (ROCEPHIN)  IV 2 g (11/15/23 2143)   heparin 1,350 Units/hr (11/15/23 1828)   vancomycin      aspirin  81 mg Oral Daily   atorvastatin  40 mg Oral Daily   cinacalcet  30 mg Oral Q breakfast   doxercalciferol  3 mcg Intravenous Q M,W,F-HD   heparin  5,000 Units Dialysis Once in dialysis   insulin aspart  0-5 Units Subcutaneous QHS   insulin aspart  0-9 Units Subcutaneous TID WC   insulin glargine-yfgn  12 Units Subcutaneous Daily   midodrine  10 mg Oral TID WC   sevelamer carbonate  4,000 mg Oral TID WC    Outpt Dialysis Orders:  Center: [facility] 4 hr 15 min 180NRe 450/700 105 kg 2.0 K/ 2.0 Ca UFP 4 AVF - Heparin 7000 units IV Initial bolus Heparin 2000  units IV mid run - Hectorol 7 mcg IV three times per week  Assessment/Plan: 1.AMS: Was on cefepime which has been stopped. Per primary team some concern for encephalitis/meningitis, is on empiric antibiotics/antivirals.  2.  ESRD - Typically on MWF schedule. Had serial HD last week 12/24-12/26, last HD 12/28. Does  not appear volume overloaded on exam. Will plan for next HD this evening or tomorrow as schedule allows, no emergent indications for HD at this time.  3.NSTEMI-work up per cardiology/primary.  4.Hyperkalemia- Resolved with HD.  5.Fever/Hypotension- BC remain no growth so far. Has been started on board spectrum antibiotics 6. HFrEF/history of severe AS-attempt to optimize volume with dialysis. ECHO  EF 30 to 35%. Cards following. Fluid restrictions and daily weights. On midodrine 10 mg 3 times daily 7. Anemia  - Hgb above goal, no ESA indicated at this time.  8. Metabolic bone disease -Phos at goal. Continue renvela. VDRA on hold d/t high calcium.  9. DMT2-per primary   Rogers Blocker, PA-C 11/16/2023, 9:18 AM  [facility] Pager: [phone]

## 2023-11-16 NOTE — Evaluation (Signed)
Clinical/Bedside Swallow Evaluation Patient Details  Name: [name] TOLMAN [name]. MRN: [id number] Date of Birth: 1961-03-06  Today's Date: 11/16/2023 Time: SLP Start Time (ACUTE ONLY): 1555 SLP Stop Time (ACUTE ONLY): 1603 SLP Time Calculation (min) (ACUTE ONLY): 8 min  Past Medical History:  Past Medical History:  Diagnosis Date   ESRD on hemodialysis (HCC)    a. HD MWF > 10 years.   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 01/2022 Echo: EF 45-50%, glob HK, mod LVH, mildly reduced RV fxn, mild MR, mod-sev MV Ca2+. Mild AS; b. 10/2023 Echo: EF 45-50%, glob HK, GrII DD, mildly reduced RV fxn, RVSP 52.64mmHg. Mild MR/MS, mod TR.   Hypertension    Nonischemic cardiomyopathy (HCC)    a. 2019 Cath [facility]): Nonobstructive disease; b. 12/2019 Echo: EF 50%; c. 12/2019 MV (Tx w/u [facility]): Ant infarct w/ peri-infarct ischemia. EF 35%; c. 05/2021 MV (Tx w/u [facility]): Cor Ca2+, EF 53%, motion artifact, equiv study, likely low risk.   Nonobstructive coronary artery disease    a.  2019 Cath ([place]): Left main normal, LAD 30 proximal, RCA 30 distal, EF 25%.   Obesity    Type 2 diabetes mellitus (HCC)    a. 10/2023 - admission w/ HHS.   Past Surgical History:  Past Surgical History:  Procedure Laterality Date   A/V FISTULAGRAM N/A 08/15/2019   Procedure: A/V FISTULAGRAM;  Surgeon: Maeola Harman, MD;  Location: [facility] INVASIVE CV LAB;  Service: Cardiovascular;  Laterality: N/A;   AMPUTATION Right 02/02/2022   Procedure: AMPUTATION 5th RAY;  Surgeon: Louann Sjogren, MD;  [facility] OR;  Service: Podiatry;  Laterality: Right;   AMPUTATION Right 05/23/2022   Procedure: RIGHT BELOW KNEE AMPUTATION;  Surgeon: Nadara Mustard, MD;  Location: [facility] OR;  Service: Orthopedics;  Laterality: Right;   AMPUTATION Right 07/04/2022   Procedure: REVISION AMPUTATION BELOW KNEE;  Surgeon: Nadara Mustard, MD;  Location: [facility] OR;  Service: Orthopedics;  Laterality: Right;   APPLICATION OF  WOUND VAC  05/23/2022   Procedure: APPLICATION OF WOUND VAC;  Surgeon: Nadara Mustard, MD;  Location: [facility] OR;  Service: Orthopedics;;   AV FISTULA PLACEMENT     CARDIAC CATHETERIZATION  02/17/2018   LHC 02/17/18 ([name]): 30% pLAD, 30% dRCA, LVEDP 25-30, LVEF 25%.     CATARACT EXTRACTION W/PHACO Right 03/29/2020   Procedure: CATARACT EXTRACTION PHACO AND INTRAOCULAR LENS PLACEMENT (IOC)  RIGHT DIABETIC;  Surgeon: Lockie Mola, MD;  Location: [facility] ORS;  Service: Ophthalmology;  Laterality: Right;  Lot # [id number] H [place]: 02:30.9 AP% 9.5% CDE: 14.38   EYE SURGERY Right    cataract removed   I & D EXTREMITY Right 02/02/2022   Procedure: IRRIGATION AND DEBRIDEMENT RIGHT FOOT;  Surgeon: Louann Sjogren, MD;  Location: [facility] OR;  Service: Podiatry;  Laterality: Right;   INCISION AND DRAINAGE OF WOUND Right 02/08/2022   Procedure: IRRIGATION AND DEBRIDEMENT WOUND;  Surgeon: Asencion Islam, DPM;  Location: [facility] OR;  Service: Podiatry;  Laterality: Right;  with removal of infected bone    INSERTION OF DIALYSIS CATHETER  08/13/2019   Procedure: Insertion Of Dialysis Catheter;  Surgeon: Larina Earthly, MD;  Location: [facility] OR;  Service: Vascular;;   INSERTION OF DIALYSIS CATHETER Left 07/19/2020   Procedure: INSERTION OF DIALYSIS CATHETER;  Surgeon: Maeola Harman, MD;  Location: [facility] OR;  Service: Vascular;  Laterality: Left;   IR US GUIDE BX ASP/DRAIN  11/07/2022   IRRIGATION AND DEBRIDEMENT ABSCESS  PERIPHERAL VASCULAR BALLOON ANGIOPLASTY  08/15/2019   Procedure: PERIPHERAL VASCULAR BALLOON ANGIOPLASTY;  Surgeon: Maeola Harman, MD;  Location: [facility] INVASIVE CV LAB;  Service: Cardiovascular;;   PERIPHERAL VASCULAR BALLOON ANGIOPLASTY Left 02/06/2022   Procedure: PERIPHERAL VASCULAR BALLOON ANGIOPLASTY;  Surgeon: Cephus Shelling, MD;  [facility] INVASIVE CV LAB;  Service: Cardiovascular;  Laterality: Left;   REVISON OF ARTERIOVENOUS FISTULA Left 07/19/2020   Procedure:  REVISON/PLICATION OF ARTERIOVENOUS FISTULA LEFT;  Surgeon: Maeola Harman, MD;  Location: [facility] OR;  Service: Vascular;  Laterality: Left;   THROMBECTOMY AND REVISION OF ARTERIOVENTOUS (AV) GORETEX  GRAFT Left 08/13/2019   Procedure: THROMBECTOMY OF LEFT ARM ARTERIOVENTOUS (AV) FISTULA;  Surgeon: Larina Earthly, MD;  [facility]: [facility] OR;  Service: Vascular;  Laterality: Left;   TOE AMPUTATION     HPI:  [name] [name] is a 62 yo male presenting to ED 12/22 after experiencing near syncope, chest pain, and hypotension during HD. Admitted with NSTEMI. Recently admitted 12/6-12/10 for a fall and uncontrolled hyperglycemia. PMH includes ESRD on HD, CHF, T2DM s/p R BKA, obesity    Assessment / Plan / Recommendation  Clinical Impression  RN states reason for SLP consult is due to increased lethargy and coughing in the absence of POs. Pt presents with poor alertness and increased distractibility. He has limited awareness of PO presentations and often makes conflicting remarks; i.e., shaking his head "no" when asked if he wants more food while simultaneously opening his mouth to accept presentations. Oral motor exam appears grossly functional. Trials of ice chips, thin liquids, and purees from pt's full liquid meal tray were observed without any overt s/s of dysphagia or aspiration. He does require frequent prompting to accept POs, but once they are in his oral cavity he initiates a prompt swallow response with no coughing/throat clearance noted. Feel that he may continue his current full liquid diet when he is fully awake and alert if given full supervision. SLP will f/u briefly for ongoing observation; however, do not anticipate the need for long-term skilled swallowing interventions. SLP Visit Diagnosis: Dysphagia, unspecified (R13.10)    Aspiration Risk  Mild aspiration risk    Diet Recommendation Thin liquid    Liquid Administration via: Cup;Straw;Spoon Medication Administration: Whole meds with  liquid Supervision: Staff to assist with self feeding;Full supervision/cueing for compensatory strategies Compensations: Minimize environmental distractions;Slow rate;Small sips/bites Postural Changes: Seated upright at 90 degrees;Remain upright for at least 30 minutes after po intake    Other  Recommendations Oral Care Recommendations: Oral care BID    Recommendations for follow up therapy are one component of a multi-disciplinary discharge planning process, led by the attending physician.  Recommendations may be updated based on patient status, additional functional criteria and insurance authorization.  Follow up Recommendations No SLP follow up      Assistance Recommended at Discharge    Functional Status Assessment Patient has had a recent decline in their functional status and demonstrates the ability to make significant improvements in function in a reasonable and predictable amount of time.  Frequency and Duration min 2x/week  1 week       Prognosis Prognosis for improved oropharyngeal function: Good Barriers to Reach Goals: Cognitive deficits;Time post onset      Swallow Study   General HPI: [name] [name] is a 62 yo male presenting to ED 12/22 after experiencing near syncope, chest pain, and hypotension during HD. Admitted with NSTEMI. Recently admitted 12/6-12/10 for a fall and uncontrolled hyperglycemia.  PMH includes ESRD on HD, CHF, T2DM s/p R BKA, obesity Type of Study: Bedside Swallow Evaluation Previous Swallow Assessment: none in chart Diet Prior to this Study: Full liquid diet Temperature Spikes Noted: No Respiratory Status: Room air History of Recent Intubation: No Behavior/Cognition: Distractible;Requires cueing;Alert Oral Cavity Assessment: Within Functional Limits Oral Care Completed by SLP: No Oral Cavity - Dentition: Adequate natural dentition Vision: Functional for self-feeding Self-Feeding Abilities: Needs assist Patient Positioning: Upright in  bed Baseline Vocal Quality: Normal Volitional Cough: Strong Volitional Swallow: Able to elicit    Oral/Motor/Sensory Function Overall Oral Motor/Sensory Function: Within functional limits   Ice Chips Ice chips: Within functional limits Presentation: Spoon   Thin Liquid Thin Liquid: Within functional limits Presentation: Straw;Cup    Nectar Thick Nectar Thick Liquid: Not tested   Honey Thick Honey Thick Liquid: Not tested   Puree Puree: Within functional limits Presentation: Spoon   Solid     Solid: Not tested      Gwynneth Aliment, M.A., CF-SLP Speech Language Pathology, Acute Rehabilitation Services  Secure Chat preferred [phone]  11/16/2023,4:22 PM

## 2023-11-16 NOTE — Consult Note (Signed)
PHARMACY - ANTICOAGULATION CONSULT NOTE  Pharmacy Consult for Heparin infusion Indication: chest pain/ACS + AFib  Allergies  Allergen Reactions   Sulfa Antibiotics Diarrhea and Nausea And Vomiting    Patient Measurements: Height: 6\' 1"  (185.4 cm) Weight: 101 kg (222 lb 10.6 oz) IBW/kg (Calculated) : 79.9 Heparin Dosing Weight: 103.9 kg  Vital Signs: Temp: 97.4 F (36.3 C) (12/30 1157) Temp Source: Oral (12/30 1157) BP: 119/73 (12/30 1157) Pulse Rate: 81 (12/30 1157)  Labs: Recent Labs    11/14/23 0332 11/14/23 1654 11/15/23 0316 11/16/23 0402  HGB 14.7 16.0 15.7 16.7  HCT 46.4 49.9 48.1 51.0  PLT 169 167 212 273  HEPARINUNFRC 0.36  --  0.36 0.28*  CREATININE 9.31*  --  7.77* 9.70*    Estimated Creatinine Clearance: 10 mL/min (A) (by C-G formula based on SCr of 9.7 mg/dL (H)).   Medical History: Past Medical History:  Diagnosis Date   ESRD on hemodialysis (HCC)    a. HD MWF > 10 years.   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 01/2022 Echo: EF 45-50%, glob HK, mod LVH, mildly reduced RV fxn, mild MR, mod-sev MV Ca2+. Mild AS; b. 10/2023 Echo: EF 45-50%, glob HK, GrII DD, mildly reduced RV fxn, RVSP 52.63mmHg. Mild MR/MS, mod TR.   Hypertension    Nonischemic cardiomyopathy (HCC)    a. 2019 Cath [facility]): Nonobstructive disease; b. 12/2019 Echo: EF 50%; c. 12/2019 MV (Tx w/u [facility]): Ant infarct w/ peri-infarct ischemia. EF 35%; c. 05/2021 MV (Tx w/u [facility]): Cor Ca2+, EF 53%, motion artifact, equiv study, likely low risk.   Nonobstructive coronary artery disease    a.  2019 Cath ([facility]): Left main normal, LAD 30 proximal, RCA 30 distal, EF 25%.   Obesity    Type 2 diabetes mellitus (HCC)    a. 10/2023 - admission w/ [facility].     Assessment: 26 yoM ESRD admitted with CP and AFL. Pharmacy to dose IV heparin.  12/29 AM: Heparin level 0.28 on low end of therapeutic goal  on heparin drip rate 1350 units/hr. No issues with heparin  infusion running or signs of bleeding per RN. CBC stable (Hgb 15.7, PLT 212). However patient combative today and refusing care so may be affecting IV line  Goal of Therapy:  Heparin level 0.3-0.7 units/ml Monitor platelets by anticoagulation protocol: Yes   Plan:  Increase  heparin at 1400 units/hr Monitor daily CBC, heparin level and s/sx of bleeding    Leota Sauers Pharm.D. CPP, BCPS Clinical Pharmacist [phone] 11/16/2023 2:08 PM

## 2023-11-16 NOTE — Plan of Care (Signed)
  Problem: Clinical Measurements: Goal: Ability to maintain clinical measurements within normal limits will improve Outcome: Progressing Goal: Respiratory complications will improve Outcome: Progressing Goal: Cardiovascular complication will be avoided Outcome: Progressing   Problem: Safety: Goal: Ability to remain free from injury will improve Outcome: Progressing   Problem: Metabolic: Goal: Ability to maintain appropriate glucose levels will improve Outcome: Progressing

## 2023-11-16 NOTE — Plan of Care (Signed)
  Problem: Education: Goal: Knowledge of General Education information will improve Description: Including pain rating scale, medication(s)/side effects and non-pharmacologic comfort measures Outcome: Progressing   Problem: Health Behavior/Discharge Planning: Goal: Ability to manage health-related needs will improve Outcome: Progressing   Problem: Clinical Measurements: Goal: Ability to maintain clinical measurements within normal limits will improve Outcome: Progressing Goal: Will remain free from infection Outcome: Progressing Goal: Diagnostic test results will improve Outcome: Progressing Goal: Respiratory complications will improve Outcome: Progressing Goal: Cardiovascular complication will be avoided 11/16/2023 2009 by Sanda Linger, RN Outcome: Progressing 11/16/2023 2008 by Sanda Linger, RN Outcome: Progressing

## 2023-11-16 NOTE — Progress Notes (Signed)
Noted patient's flexiseal rectal tube was dislodged. No signs of trauma on the rectum. Patient is refusing to place the rectal tube back. Explained to the patient the benefit of placing his rectal tube but patient still refused. Instructed patient to notify RN of bowel movement to prevent skin breakdown. Felicity Coyer, RN

## 2023-11-16 NOTE — Progress Notes (Signed)
Pt receives out-pt HD at [facility] on MWF. Will assist as needed.   Melven Sartorius Renal Navigator [phone]

## 2023-11-17 ENCOUNTER — Inpatient Hospital Stay (HOSPITAL_COMMUNITY): Payer: Medicare Other

## 2023-11-17 ENCOUNTER — Inpatient Hospital Stay (INDEPENDENT_AMBULATORY_CARE_PROVIDER_SITE_OTHER): Payer: Medicare Other | Admitting: Primary Care

## 2023-11-17 DIAGNOSIS — I214 Non-ST elevation (NSTEMI) myocardial infarction: Secondary | ICD-10-CM | POA: Diagnosis not present

## 2023-11-17 LAB — CBC WITH DIFFERENTIAL/PLATELET
Abs Immature Granulocytes: 0.16 10*3/uL — ABNORMAL HIGH (ref 0.00–0.07)
Basophils Absolute: 0.1 10*3/uL (ref 0.0–0.1)
Basophils Relative: 1 %
Eosinophils Absolute: 0.3 10*3/uL (ref 0.0–0.5)
Eosinophils Relative: 4 %
HCT: 50 % (ref 39.0–52.0)
Hemoglobin: 16.5 g/dL (ref 13.0–17.0)
Immature Granulocytes: 2 %
Lymphocytes Relative: 22 %
Lymphs Abs: 1.7 10*3/uL (ref 0.7–4.0)
MCH: 28.7 pg (ref 26.0–34.0)
MCHC: 33 g/dL (ref 30.0–36.0)
MCV: 87 fL (ref 80.0–100.0)
Monocytes Absolute: 1.5 10*3/uL — ABNORMAL HIGH (ref 0.1–1.0)
Monocytes Relative: 20 %
Neutro Abs: 4 10*3/uL (ref 1.7–7.7)
Neutrophils Relative %: 51 %
Platelets: 383 10*3/uL (ref 150–400)
RBC: 5.75 MIL/uL (ref 4.22–5.81)
RDW: 14.8 % (ref 11.5–15.5)
WBC: 7.8 10*3/uL (ref 4.0–10.5)
nRBC: 2.6 % — ABNORMAL HIGH (ref 0.0–0.2)

## 2023-11-17 LAB — RESPIRATORY PANEL BY PCR

## 2023-11-17 LAB — GLUCOSE, CAPILLARY
Glucose-Capillary: 181 mg/dL — ABNORMAL HIGH (ref 70–99)
Glucose-Capillary: 218 mg/dL — ABNORMAL HIGH (ref 70–99)
Glucose-Capillary: 187 mg/dL — ABNORMAL HIGH (ref 70–99)
Glucose-Capillary: 158 mg/dL — ABNORMAL HIGH (ref 70–99)

## 2023-11-17 LAB — COMPREHENSIVE METABOLIC PANEL
ALT: 23 U/L (ref 0–44)
AST: 27 U/L (ref 15–41)
Albumin: 2.9 g/dL — ABNORMAL LOW (ref 3.5–5.0)
Alkaline Phosphatase: 78 U/L (ref 38–126)
Anion gap: 21 — ABNORMAL HIGH (ref 5–15)
BUN: 49 mg/dL — ABNORMAL HIGH (ref 8–23)
CO2: 21 mmol/L — ABNORMAL LOW (ref 22–32)
Calcium: 9.8 mg/dL (ref 8.9–10.3)
Chloride: 91 mmol/L — ABNORMAL LOW (ref 98–111)
Creatinine, Ser: 11.15 mg/dL — ABNORMAL HIGH (ref 0.61–1.24)
GFR, Estimated: 5 mL/min — ABNORMAL LOW (ref >60)
Glucose, Bld: 163 mg/dL — ABNORMAL HIGH (ref 70–99)
Potassium: 3.8 mmol/L (ref 3.5–5.1)
Sodium: 133 mmol/L — ABNORMAL LOW (ref 135–145)
Total Bilirubin: 0.9 mg/dL (ref 0.0–1.2)
Total Protein: 8.6 g/dL — ABNORMAL HIGH (ref 6.5–8.1)

## 2023-11-17 LAB — SARS CORONAVIRUS 2 BY RT PCR: SARS Coronavirus 2 by RT PCR: NEGATIVE

## 2023-11-17 LAB — RPR: RPR Ser Ql: NONREACTIVE

## 2023-11-17 LAB — HEPARIN LEVEL (UNFRACTIONATED): Heparin Unfractionated: 0.26 [IU]/mL — ABNORMAL LOW (ref 0.30–0.70)

## 2023-11-17 LAB — MAGNESIUM: Magnesium: 2.1 mg/dL (ref 1.7–2.4)

## 2023-11-17 LAB — PHOSPHORUS: Phosphorus: 5.1 mg/dL — ABNORMAL HIGH (ref 2.5–4.6)

## 2023-11-17 MED ORDER — HEPARIN SODIUM (PORCINE) 5000 UNIT/ML IJ SOLN
5000.0000 [IU] | Freq: Three times a day (TID) | INTRAMUSCULAR | Status: DC
  Administered 2023-11-17 – 2023-11-20 (×9): 5000 [IU] via SUBCUTANEOUS
  Filled 2023-11-17 (×9): qty 1

## 2023-11-17 MED ORDER — OSELTAMIVIR PHOSPHATE 30 MG PO CAPS
30.0000 mg | ORAL_CAPSULE | ORAL | Status: DC
  Administered 2023-11-17: 30 mg via ORAL
  Filled 2023-11-17: qty 1

## 2023-11-17 MED ORDER — VANCOMYCIN HCL IN DEXTROSE 1-5 GM/200ML-% IV SOLN
1000.0000 mg | Freq: Once | INTRAVENOUS | Status: DC
  Filled 2023-11-17: qty 200

## 2023-11-17 MED ORDER — OSELTAMIVIR PHOSPHATE 30 MG PO CAPS
30.0000 mg | ORAL_CAPSULE | Freq: Once | ORAL | Status: AC
  Administered 2023-11-17: 30 mg via ORAL
  Filled 2023-11-17: qty 1

## 2023-11-17 NOTE — Progress Notes (Addendum)
 Patient Name: [name] AGUILERA Sr. Date of Encounter: 11/17/2023 [facility] Cardiologist: None   Interval Summary  .    62 y.o. male with PMH of NICM, nonobstructive CAD, HTN, HLD, DM II, PAD s/p R BKA, and ESRD on HD, cardiology is following for NSTEMI  Patient is not speaking during encounter, he smiled and answered no questions and does not follow commands.   Vital Signs .    Vitals:   11/16/23 0721 11/16/23 1157 11/16/23 1931 11/17/23 0750  BP: 99/69 119/73 133/70 111/64  Pulse: 80 81 85 86  Resp: 19 19 18 16   Temp: (!) 97.5 F (36.4 C) (!) 97.4 F (36.3 C) (!) 97.5 F (36.4 C) 98.6 F (37 C)  TempSrc: Oral Oral Axillary Oral  SpO2: 94% 94% 92% 95%  Weight:      Height:       No intake or output data in the 24 hours ending 11/17/23 1549    11/16/2023    4:25 AM 11/15/2023    5:00 AM 11/14/2023   12:31 PM  Last 3 Weights  Weight (lbs) 222 lb 10.6 oz 225 lb 5 oz 220 lb 7.4 oz  Weight (kg) 101 kg 102.2 kg 100 kg      Telemetry/ECG    Sinus rhythm  - Personally Reviewed  Physical Exam .   GEN: No acute distress.   Neck: No JVD Cardiac: RRR, no murmurs, rubs, or gallops.  Respiratory: Clear to auscultation bilaterally. GI: Soft, nontender, non-distended  MS: No LLE edema, right BKA   Assessment & Plan .     NSTEMI Non-obstructive CAD - previously underwent cath in [facility] 2019 due to LV dysfunction with EF of 25%, cath showed 30% stenosis in prox LAD and distal RCA, medically managed.  - stress myoview 06/06/21 [facility]: moderate sized, moderately severe, partially reversible perfusion defect involving the apical inferior, mid inferior and basal inferior segments. This is consistent with possible artifact (motion and misregistration of attenuation CT) but, cannot rule out scar and / or ischemia.There is a moderate sized, severe, perfusion defect involving the apical, apical inferior, mid inferior, and basal inferior segments. The defects of the  involved segments are nearly completely reversible. This is consistent with probable ischemia.  - Hs trop peaked 8942 - Echo 11/10/2023 showed EF 30-35%, basal inferoposterior akinesis and restrictive diastolic filling, likely low-flow low gradient severe aortic stenosis with low flow and low gradient grade 3 DD.  - L/R HC was planned, deferred due to fever and acute encephalopathy, now on empiric treatment for meningitis /encephalitis, hold off further ischemic evaluation until adequate neurological recovery  - has completed 48 hours heparin  gtt, continue asa 81mg , lipitor 40mg , BP low on midodrine  support, will not add additional therapy   Chronic HFrEF - LVEF dropped 30-35% on Echo now, from 45-50% on 10/25/23 - unable proceed ischemic evaluation at this time  - GDMT: limited due to hypotension and ESRD - volume managed by dialysis  - he appears euvolemic on exam today   Acute encephalopathy  ESRD Flu A  Type 2 DM  Hx of CVA  - per primary team      For questions or updates, please contact [facility] Please consult [email or web address] for contact info under        Signed, Xika Zhao, NP   Patient seen and examined with [facility] NP.  Agree as above, with the following exceptions and changes as noted below. Remains somnolent. Gen: NAD, CV: RRR,  no murmurs, Lungs: clear, Abd: soft, Extrem: Warm, well perfused, no edema, Neuro/Psych: somnolent. All available labs, radiology testing, previous records reviewed. Defer invasive workup at this time. Mental status needs to improve. Stopped heparin  today given >48 hr therapy for now medical management of NSTEMI.   Yazan Gatling A Janeva Peaster, MD 11/17/23 4:58 PM

## 2023-11-17 NOTE — Consult Note (Addendum)
 PHARMACY - ANTICOAGULATION CONSULT NOTE  Pharmacy Consult for Heparin  infusion Indication: chest pain/ACS + AFib  Allergies  Allergen Reactions   Sulfa Antibiotics Diarrhea and Nausea And Vomiting    Patient Measurements: Height: 6' 1 (185.4 cm) Weight: 101 kg (222 lb 10.6 oz) IBW/kg (Calculated) : 79.9 Heparin  Dosing Weight: 103.9 kg  Vital Signs:    Labs: Recent Labs    11/15/23 0316 11/16/23 0402 11/17/23 0245  HGB 15.7 16.7 16.5  HCT 48.1 51.0 50.0  PLT 212 273 383  HEPARINUNFRC 0.36 0.28* 0.26*  CREATININE 7.77* 9.70* 11.15*    Estimated Creatinine Clearance: 8.7 mL/min (A) (by C-G formula based on SCr of 11.15 mg/dL (H)).   Medical History: Past Medical History:  Diagnosis Date   ESRD on hemodialysis (HCC)    a. HD MWF > 10 years.   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 01/2022 Echo: EF 45-50%, glob HK, mod LVH, mildly reduced RV fxn, mild MR, mod-sev MV Ca2+. Mild AS; b. 10/2023 Echo: EF 45-50%, glob HK, GrII DD, mildly reduced RV fxn, RVSP 52.39mmHg. Mild MR/MS, mod TR.   Hypertension    Nonischemic cardiomyopathy (HCC)    a. 2019 Cath [facility]): Nonobstructive disease; b. 12/2019 Echo: EF 50%; c. 12/2019 MV (Tx w/u [facility]): Ant infarct w/ peri-infarct ischemia. EF 35%; c. 05/2021 MV (Tx w/u [facility]): Cor Ca2+, EF 53%, motion artifact, equiv study, likely low risk.   Nonobstructive coronary artery disease    a.  2019 Cath ([facility]): Left main normal, LAD 30 proximal, RCA 30 distal, EF 25%.   Obesity    Type 2 diabetes mellitus (HCC)    a. 10/2023 - admission w/ [facility].     Assessment: 84 yoM ESRD admitted with CP and AFL. Pharmacy to dose IV heparin .  Heparin  level subtherapeutic (0.26) after rate increase 12/31 afternoon - on heparin  drip rate 1400 units/hr. No issues with heparin  infusion running or signs of bleeding noted per RN. CBC stable (Hgb 16.5, PLTc 383). Patient continues to be combative today per RN.  Deferring  R/LHC due to AMS.  Per Dr. Acharya, discontinuing heparin  as patient has been on for >48hrs, with troponin trending down.    Goal of Therapy:  Heparin  level 0.3-0.7 units/ml Monitor platelets by anticoagulation protocol: Yes   Plan:  Discontinue heparin  drip Transition to SQ Heparin  5000 Q8H Monitor daily CBC, heparin  level and s/sx of bleeding  F/U need for anticoagulation    Marget Hench, PharmD PGY1 Pharmacy Resident

## 2023-11-17 NOTE — Progress Notes (Addendum)
 PROGRESS NOTE    [name] [name]  FMW:[id number] DOB: Mar 05, 1961 DOA: 11/08/2023 PCP: Pcp, No  Chief Complaint  Patient presents with   Hypotension   Cough   Shortness of Breath    Brief Narrative:   [name] [name] is [name] [name] 62 y.o. male with medical history significant of ESRD on HD, (MWF) heart failure, T2DM, sp right BKA and obesity who presented after experiencing [name] [name] near syncope during hemodialysis. Recent hospitalization 12/06 to 12/10 for uncontrolled hyperglycemia. Patient was having HD on 12/22 when he was noted to have chest pain and hypotension, along with [name] [name] near syncope episode. Apparently treatment was stopped and EMS was called. He wss found hypotensive and was transported to the ED. His blood pressure was initially low in the ED 75/59, but improved to 106/72. He was found to have very elevated high sensitive troponin and cardiology was consulted for evaluation.   He was diagnosed with NSTEMI and plan to admit patient to [facility] under cardiology. Pt was transferred to [facility] for further evaluation.   Now on broad spectrum abx to cover for possible meningitis.    Continues to have significant encephalopathy.  Now diagnosed with flu.  Neurology asked to reevaluate.    Assessment & Plan:   Principal Problem:   NSTEMI (non-ST elevated myocardial infarction) (HCC) Active Problems:   Sepsis with encephalopathy and septic shock (HCC)   ESRD on dialysis (HCC)   Hypotension   Acute on chronic systolic CHF (congestive heart failure) (HCC)   HTN (hypertension)   Acute metabolic encephalopathy   DM2 (diabetes mellitus, type 2) (HCC)   History of CVA    Protein-calorie malnutrition, mild (HCC)   Obesity (BMI 30-39.9)   Atrial flutter with rapid ventricular response (HCC)   Aortic valve stenosis  Fever w/ acute metabolic encephalopathy: Concern for encephalitis/meningitis, though no LP with heparin  infusion. ?cefepime  contributing, has been stopped and mentation improving as  well.  Has influenza infection, unclear whether this was cause of fever at presentation or this is incidental.   - MRI brain with ill defined restricted diffusion within splenium of the corpus callosum.  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  Ddx includes seizures, metabolic disturbance, infection.  Demyelination, infarct, PRES less likely.   - EEG with mild to diffuse encephalopathy - IV acyclovir  450 mg daily  - IV ampicillin  2 g q12  - IV ceftriaxone  2 g q12  - IV vancomycin  1g w/ HD  - tamiflu  - encephalopathy is persistent, maybe worse today, he's lethargic, didn't interact meaningfully with me, not clearly following commands -> I discussed again with neurology given concern for worsening/persistent encephalopathy, difficult exam due to his encephalopathy.  Repeat EEG, will defer additional neuro imaging to neurology.   - Appreciate neurology consultation. Overall presentation most c/w septic encephalopathy.  Meningitis [name] [name] consideration.  Empiric meningitis dose antibiotics including ampicillin .  Will discuss overall plan with neurology -> recommending continuing meningitis coverage to completion.  Continue airborne precautions per neurology. - negative RPR, HIV, normal B12, normal folate, VBG without hypercarbia, ammonia only mildly elevated (repeat)  NSTEMI:  - heparin  gtt complete, ASA, statin (LDL is 39). No BB w/hypotension.  - plans for [facility] on hold pending adequate neurologic recovery - appreciate further cards recs   ESRD:  - Continue HD per nephrology, treatment 12/28 on TTS schedule.  Next HD today. - Renvela  4000 mg PO tid  - Sensipar  30 mg PO daily - Midodrine  10 mg PO tid, getting  albumin  prn more low BP.   Acute on chronic systolic CHF: Cardiology following, possible concern for low flow low gradient severe AS.  - Manage volume with HD.  - echo with EF 30-35%, regional wall motion abnormalities, mildly reduced RVSF (EF decreased, possible severe aortic  stenosis) - volume per renal     Diarrhea - negative C diff, rectal tube was removed today  IDT2DM: With hyperglycemia, ongoing. HbA1c is >15.5% 10/24/2023. - Increase basal to 12u qHS and intensity of SSI   History of CVA  - Continue ASA, statin   Mild protein-calorie malnutrition: - Complicating care    Obesity (BMI 30-39.9) - Complicating care     DVT prophylaxis: heparin  Code Status: full Family Communication: none Disposition:   Status is: Inpatient Remains inpatient appropriate because: need for continued inpatient care   Consultants:  Neurology Cardiology   Procedures:  IMPRESSIONS     1. Left ventricular ejection fraction, by estimation, is 30 to 35%. The  left ventricle has moderately decreased function. The left ventricle  demonstrates regional wall motion abnormalities (see scoring  diagram/findings for description). There is  moderate concentric left ventricular hypertrophy. Left ventricular  diastolic parameters are consistent with Grade III diastolic dysfunction  (restrictive).   2. Right ventricular systolic function is mildly reduced. The right  ventricular size is normal. There is mildly elevated pulmonary artery  systolic pressure. The estimated right ventricular systolic pressure is  42.2 mmHg.   3. Left atrial size was moderately dilated.   4. The mitral valve is degenerative. Trivial mitral valve regurgitation.  Moderate mitral annular calcification.   5. The aortic valve is tricuspid. There is moderate calcification of the  aortic valve. Aortic valve regurgitation is not visualized. Moderate to  severe aortic valve stenosis - suspect upper end of scale, low flow/low  gradient. Aortic valve area, by VTI  measures 0.98 cm. Aortic valve mean gradient measures 22.0 mmHg.  Dimentionless index 0.19.   6. Aortic dilatation noted. There is borderline dilatation of the aortic  root, measuring 40 mm.   7. The inferior vena cava is dilated in size  with <50% respiratory  variability, suggesting right atrial pressure of 15 mmHg.   Comparison(s): Prior images reviewed side by side. LVEF has decreased,  30-35% range with basal inferoposterior akinesis and restrictive diastolic  filling. Aortic stenosis possibly severe (low flow/low gradient). Mildly  elevated RVSP.   EEG IMPRESSION: This study is suggestive of mild to moderate diffuse encephalopathy.  No seizures or epileptiform discharges were seen throughout the recording.  Antimicrobials:  Anti-infectives (From admission, onward)    Start     Dose/Rate Route Frequency Ordered Stop   11/17/23 1800  oseltamivir  (TAMIFLU ) capsule 30 mg        30 mg Oral Every T-Th-Sa (1800) 11/17/23 0800 11/21/23 1759   11/17/23 1115  vancomycin  (VANCOCIN ) IVPB 1000 mg/200 mL premix        1,000 mg 200 mL/hr over 60 Minutes Intravenous Once in dialysis 11/17/23 1021     11/17/23 1000  oseltamivir  (TAMIFLU ) capsule 30 mg        30 mg Oral  Once 11/17/23 0759 11/17/23 0952   11/16/23 1200  vancomycin  (VANCOCIN ) IVPB 1000 mg/200 mL premix        1,000 mg 200 mL/hr over 60 Minutes Intravenous Every M-W-F (Hemodialysis) 11/15/23 1658 11/23/23 1159   11/12/23 2300  vancomycin  (VANCOCIN ) IVPB 1000 mg/200 mL premix        1,000 mg 200 mL/hr  over 60 Minutes Intravenous  Once 11/12/23 2226 11/13/23 0131   11/12/23 2000  cefTRIAXone  (ROCEPHIN ) 2 g in sodium chloride  0.9 % 100 mL IVPB        2 g 200 mL/hr over 30 Minutes Intravenous Every 12 hours 11/12/23 1627 11/23/23 2359   11/12/23 2000  ampicillin  (OMNIPEN) 2 g in sodium chloride  0.9 % 100 mL IVPB        2 g 300 mL/hr over 20 Minutes Intravenous Every 12 hours 11/12/23 1627 11/23/23 2359   11/12/23 1730  acyclovir  (ZOVIRAX ) 450 mg in dextrose  5 % 100 mL IVPB        5 mg/kg  89.6 kg (Adjusted) 109 mL/hr over 60 Minutes Intravenous Every 24 hours 11/12/23 1627 11/22/23 1729   11/11/23 1915  piperacillin -tazobactam (ZOSYN ) IVPB 2.25 g  Status:   Discontinued        2.25 g 100 mL/hr over 30 Minutes Intravenous Every 8 hours 11/11/23 1825 11/12/23 1615   11/11/23 1245  metroNIDAZOLE  (FLAGYL ) IVPB 500 mg  Status:  Discontinued        500 mg 100 mL/hr over 60 Minutes Intravenous Every 12 hours 11/11/23 1146 11/11/23 1825   11/11/23 1200  vancomycin  (VANCOCIN ) IVPB 1000 mg/200 mL premix  Status:  Discontinued        1,000 mg 200 mL/hr over 60 Minutes Intravenous Every M-W-F (Hemodialysis) 11/10/23 1458 11/15/23 1658   11/10/23 1545  vancomycin  (VANCOCIN ) IVPB 1000 mg/200 mL premix       Placed in Followed by Linked Group   1,000 mg 200 mL/hr over 60 Minutes Intravenous  Once 11/10/23 1458 11/10/23 1917   11/10/23 1545  vancomycin  (VANCOCIN ) IVPB 1000 mg/200 mL premix       Placed in Followed by Linked Group   1,000 mg 200 mL/hr over 60 Minutes Intravenous  Once 11/10/23 1458 11/10/23 1815   11/10/23 1545  ceFEPIme  (MAXIPIME ) 1 g in sodium chloride  0.9 % 100 mL IVPB  Status:  Discontinued        1 g 200 mL/hr over 30 Minutes Intravenous Every M-W-F 11/10/23 1458 11/10/23 1520   11/10/23 1545  ceFEPIme  (MAXIPIME ) 2 g in sodium chloride  0.9 % 100 mL IVPB  Status:  Discontinued        2 g 200 mL/hr over 30 Minutes Intravenous Every M-W-F 11/10/23 1520 11/11/23 1810       Subjective: Lethargic, doesn't interact much   Objective: Vitals:   11/16/23 0721 11/16/23 1157 11/16/23 1931 11/17/23 0750  BP: 99/69 119/73 133/70 111/64  Pulse: 80 81 85 86  Resp: 19 19 18 16   Temp: (!) 97.5 F (36.4 C) (!) 97.4 F (36.3 C) (!) 97.5 F (36.4 C) 98.6 F (37 C)  TempSrc: Oral Oral Axillary Oral  SpO2: 94% 94% 92% 95%  Weight:      Height:       No intake or output data in the 24 hours ending 11/17/23 1536  Filed Weights   11/14/23 1231 11/15/23 0500 11/16/23 0425  Weight: 100 kg 102.2 kg 101 kg    Examination:  General: ill appearing Cardiovascular: Heart sounds show Peyten Weare regular rate, and rhythm. No gallops or rubs. No  murmurs. No JVD. Lungs: Clear to auscultation bilaterally with good air movement. No rales, rhonchi or wheezes. Abdomen: Soft, nontender, nondistended with normal active bowel sounds. No masses. No hepatosplenomegaly. Neurological: lethargic, opens eyes to voice, looks at me, but doesn't follow commands or communicate/interact meaningfully - difficult neuro exam as  Taeshaun Rames result - spontaneous movement of upper extremities, he's not moving lowers (I wasn't able to get him to move lowers with irritative stimuli - rubbing bottom of his L foot, checking for clonus) Extremities: R BKA  Data Reviewed: I have personally reviewed following labs and imaging studies  CBC: Recent Labs  Lab 11/14/23 0332 11/14/23 1654 11/15/23 0316 11/16/23 0402 11/17/23 0245  WBC 6.4 5.2 6.5 7.9 7.8  NEUTROABS  --   --   --   --  4.0  HGB 14.7 16.0 15.7 16.7 16.5  HCT 46.4 49.9 48.1 51.0 50.0  MCV 88.5 88.9 87.5 87.5 87.0  PLT 169 167 212 273 383    Basic Metabolic Panel: Recent Labs  Lab 11/13/23 0816 11/14/23 0332 11/15/23 0316 11/16/23 0402 11/17/23 0245  NA 130* 129* 130* 132* 133*  K 3.9 4.1 3.6 3.9 3.8  CL 91* 91* 90* 94* 91*  CO2 23 20* 22 17* 21*  GLUCOSE 258* 277* 184* 146* 163*  BUN 30* 40* 28* 41* 49*  CREATININE 7.21* 9.31* 7.77* 9.70* 11.15*  CALCIUM  9.2 9.5 9.7 9.8 9.8  MG  --   --   --   --  2.1  PHOS 4.0 4.8* 4.1 4.6 5.1*    GFR: Estimated Creatinine Clearance: 8.7 mL/min (Edwyn Inclan) (by C-G formula based on SCr of 11.15 mg/dL (H)).  Liver Function Tests: Recent Labs  Lab 11/13/23 0816 11/14/23 0332 11/15/23 0316 11/16/23 0402 11/17/23 0245  AST  --   --   --   --  27  ALT  --   --   --   --  23  ALKPHOS  --   --   --   --  78  BILITOT  --   --   --   --  0.9  PROT  --   --   --   --  8.6*  ALBUMIN  2.9* 2.7* 3.1* 2.9* 2.9*    CBG: Recent Labs  Lab 11/16/23 1144 11/16/23 1601 11/16/23 2121 11/17/23 0645 11/17/23 1224  GLUCAP 171* 153* 194* 181* 218*     Recent Results  (from the past 240 hours)  MRSA Next Gen by PCR, Nasal     Status: Abnormal   Collection Time: 11/09/23  4:15 PM   Specimen: Nasal Mucosa; Nasal Swab  Result Value Ref Range Status   MRSA by PCR Next Gen DETECTED (Kynnadi Dicenso) NOT DETECTED Final    Comment: RESULT CALLED TO, READ BACK BY AND VERIFIED WITH: C HALL,RN@[id number]  11/09/23 MK (NOTE) The GeneXpert MRSA Assay (FDA approved for NASAL specimens only), is one component of Vasilis Luhman comprehensive MRSA colonization surveillance program. It is not intended to diagnose MRSA infection nor to guide or monitor treatment for MRSA infections. Test performance is not FDA approved in patients less than 57 years old. Performed at [facility], [address] [place], [place] [address]   Culture, blood (Routine X 2) w Reflex to ID Panel     Status: None   Collection Time: 11/10/23  3:03 PM   Specimen: BLOOD RIGHT ARM  Result Value Ref Range Status   Specimen Description BLOOD RIGHT ARM  Final   Special Requests   Final    BOTTLES DRAWN AEROBIC AND ANAEROBIC Blood Culture results may not be optimal due to an inadequate volume of blood received in culture bottles   Culture   Final    NO GROWTH 5 DAYS Performed at [facility], [address],  [place], [place] [address]    Report Status 11/15/2023 FINAL  Final  Culture, blood (Routine X 2) w Reflex to ID Panel     Status: None   Collection Time: 11/10/23  3:08 PM   Specimen: BLOOD RIGHT ARM  Result Value Ref Range Status   Specimen Description BLOOD RIGHT ARM  Final   Special Requests   Final    BOTTLES DRAWN AEROBIC ONLY Blood Culture results may not be optimal due to an inadequate volume of blood received in culture bottles   Culture   Final    NO GROWTH 5 DAYS Performed at [facility], [address], [place], [place] [address]    Report Status 11/15/2023 FINAL  Final  C Difficile Quick Screen w PCR reflex     Status: None   Collection Time: 11/12/23  8:37 AM   Specimen: STOOL   Result Value Ref Range Status   C Diff antigen NEGATIVE NEGATIVE Final   C Diff toxin NEGATIVE NEGATIVE Final   C Diff interpretation No C. difficile detected.  Final    Comment: Performed at [facility], [address] [place], [place] [address]  Respiratory (~20 pathogens) panel by PCR     Status: Abnormal   Collection Time: 11/16/23  2:32 PM   Specimen: Nasopharyngeal Swab; Respiratory  Result Value Ref Range Status   Adenovirus NOT DETECTED NOT DETECTED Final   Coronavirus 229E NOT DETECTED NOT DETECTED Final    Comment: (NOTE) The Coronavirus on the Respiratory Panel, DOES NOT test for the novel  Coronavirus (2019 nCoV)    Coronavirus HKU1 NOT DETECTED NOT DETECTED Final   Coronavirus NL63 NOT DETECTED NOT DETECTED Final   Coronavirus OC43 NOT DETECTED NOT DETECTED Final   Metapneumovirus NOT DETECTED NOT DETECTED Final   Rhinovirus / Enterovirus NOT DETECTED NOT DETECTED Final   Influenza Massiah Minjares H1 2009 DETECTED (Graclyn Lawther) NOT DETECTED Final   Influenza B NOT DETECTED NOT DETECTED Final   Parainfluenza Virus 1 NOT DETECTED NOT DETECTED Final   Parainfluenza Virus 2 NOT DETECTED NOT DETECTED Final   Parainfluenza Virus 3 NOT DETECTED NOT DETECTED Final   Parainfluenza Virus 4 NOT DETECTED NOT DETECTED Final   Respiratory Syncytial Virus NOT DETECTED NOT DETECTED Final   Bordetella pertussis NOT DETECTED NOT DETECTED Final   Bordetella Parapertussis NOT DETECTED NOT DETECTED Final   Chlamydophila pneumoniae NOT DETECTED NOT DETECTED Final   Mycoplasma pneumoniae NOT DETECTED NOT DETECTED Final    Comment: Performed at [facility], [address] [place], [place] [address]  SARS Coronavirus 2 by RT PCR (hospital order, performed in [facility] hospital lab) *cepheid single result test* Anterior Nasal Swab     Status: None   Collection Time: 11/16/23  2:32 PM   Specimen: Anterior Nasal Swab  Result Value Ref Range Status   SARS Coronavirus 2 by RT PCR NEGATIVE NEGATIVE  Final    Comment: Performed at [facility], [address] [place], [place] [address]         Radiology Studies: DG CHEST PORT 1 VIEW Result Date: 11/16/2023 CLINICAL DATA:  Cough and congestion EXAM: PORTABLE CHEST 1 VIEW COMPARISON:  11/10/2019 FINDINGS: Check shadow is enlarged but stable. Aortic calcifications are again seen. Lungs are hypoinflated but clear. No focal infiltrate or effusion is noted. IMPRESSION: No acute abnormality noted. Electronically Signed   By: Oneil Devonshire M.D.   On: 11/16/2023 18:19  Scheduled Meds:  aspirin   81 mg Oral Daily   atorvastatin   40 mg Oral Daily   cinacalcet   30 mg Oral Q breakfast   doxercalciferol   3 mcg Intravenous Q M,W,F-HD   heparin  injection (subcutaneous)  5,000 Units Subcutaneous Q8H   insulin  aspart  0-5 Units Subcutaneous QHS   insulin  aspart  0-9 Units Subcutaneous TID WC   insulin  glargine-yfgn  12 Units Subcutaneous Daily   midodrine   10 mg Oral TID WC   oseltamivir   30 mg Oral Q T,Th,Sat-1800   sevelamer  carbonate  4,000 mg Oral TID WC   Continuous Infusions:  acyclovir  450 mg (11/16/23 1743)   ampicillin  (OMNIPEN) IV 2 g (11/17/23 1135)   cefTRIAXone  (ROCEPHIN )  IV 2 g (11/17/23 0955)   vancomycin      vancomycin        LOS: 9 days    Time spent: over 30 min    Meliton Monte, MD [facility]   To contact the attending provider between 7A-7P or the covering provider during after hours 7P-7A, please log into the web site [email or web address] and access using universal [place] password for that web site. If you do not have the password, please call the hospital operator.  11/17/2023, 3:36 PM

## 2023-11-17 NOTE — Progress Notes (Addendum)
 Patient ID: [name] [name], male   DOB: 1961/05/23, 62 y.o.   MRN: [id number] S: Remains nonverbal and confused.  Opening his eyes and responding to voice and touch. O:BP 111/64 (BP Location: Right Arm)   Pulse 86   Temp 98.6 F (37 C) (Oral)   Resp 16   Ht 6' 1 (1.854 m)   Wt 101 kg   SpO2 95%   BMI 29.38 kg/m  No intake or output data in the 24 hours ending 11/17/23 1005 Intake/Output: I/O last 3 completed shifts: In: 1036.1 [IV Piggyback:1036.1] Out: -   Intake/Output this shift:  No intake/output data recorded. Weight change:  Gen: NAD CVS: RRR Resp: CTA Abd: +BS, soft, NT/ND3 Ext: no edema, LUE AVF +T/B  Recent Labs  Lab 11/10/23 1503 11/11/23 0407 11/11/23 1647 11/12/23 0850 11/13/23 0816 11/14/23 0332 11/15/23 0316 11/16/23 0402 11/17/23 0245  NA 130*   < > 127* 132* 130* 129* 130* 132* 133*  K 5.9*   < > 6.3* 4.6 3.9 4.1 3.6 3.9 3.8  CL 89*   < > 90* 93* 91* 91* 90* 94* 91*  CO2 19*   < > 21* 21* 23 20* 22 17* 21*  GLUCOSE 196*   < > 275* 214* 258* 277* 184* 146* 163*  BUN 32*   < > 56* 35* 30* 40* 28* 41* 49*  CREATININE 8.80*   < > 11.53* 8.39* 7.21* 9.31* 7.77* 9.70* 11.15*  ALBUMIN  3.5   < > 3.3* 3.1* 2.9* 2.7* 3.1* 2.9* 2.9*  CALCIUM  9.7   < > 10.1 9.6 9.2 9.5 9.7 9.8 9.8  PHOS  --    < > 7.2* 6.0* 4.0 4.8* 4.1 4.6 5.1*  AST 117*  --   --   --   --   --   --   --  27  ALT 67*  --   --   --   --   --   --   --  23   < > = values in this interval not displayed.   Liver Function Tests: Recent Labs  Lab 11/10/23 1503 11/11/23 0407 11/15/23 0316 11/16/23 0402 11/17/23 0245  AST 117*  --   --   --  27  ALT 67*  --   --   --  23  ALKPHOS 135*  --   --   --  78  BILITOT 1.0  --   --   --  0.9  PROT 9.3*  --   --   --  8.6*  ALBUMIN  3.5   < > 3.1* 2.9* 2.9*   < > = values in this interval not displayed.   No results for input(s): LIPASE, AMYLASE in the last 168 hours. Recent Labs  Lab 11/16/23 1518  AMMONIA 36*   CBC: Recent Labs   Lab 11/10/23 1503 11/11/23 0407 11/14/23 0332 11/14/23 1654 11/15/23 0316 11/16/23 0402 11/17/23 0245  WBC 9.6   < > 6.4 5.2 6.5 7.9 7.8  NEUTROABS 6.6  --   --   --   --   --  4.0  HGB 15.3   < > 14.7 16.0 15.7 16.7 16.5  HCT 47.4   < > 46.4 49.9 48.1 51.0 50.0  MCV 89.4   < > 88.5 88.9 87.5 87.5 87.0  PLT 179   < > 169 167 212 273 383   < > = values in this interval not displayed.   Cardiac Enzymes: No results  for input(s): CKTOTAL, CKMB, CKMBINDEX, TROPONINI in the last 168 hours. CBG: Recent Labs  Lab 11/16/23 0717 11/16/23 1144 11/16/23 1601 11/16/23 2121 11/17/23 0645  GLUCAP 162* 171* 153* 194* 181*    Iron  Studies: No results for input(s): IRON , TIBC, TRANSFERRIN, FERRITIN in the last 72 hours. Studies/Results: DG CHEST PORT 1 VIEW Result Date: 11/16/2023 CLINICAL DATA:  Cough and congestion EXAM: PORTABLE CHEST 1 VIEW COMPARISON:  11/10/2019 FINDINGS: Check shadow is enlarged but stable. Aortic calcifications are again seen. Lungs are hypoinflated but clear. No focal infiltrate or effusion is noted. IMPRESSION: No acute abnormality noted. Electronically Signed   By: Oneil Devonshire M.D.   On: 11/16/2023 18:19    aspirin   81 mg Oral Daily   atorvastatin   40 mg Oral Daily   cinacalcet   30 mg Oral Q breakfast   doxercalciferol   3 mcg Intravenous Q M,W,F-HD   insulin  aspart  0-5 Units Subcutaneous QHS   insulin  aspart  0-9 Units Subcutaneous TID WC   insulin  glargine-yfgn  12 Units Subcutaneous Daily   midodrine   10 mg Oral TID WC   oseltamivir   30 mg Oral Q T,Th,Sat-1800   sevelamer  carbonate  4,000 mg Oral TID WC    BMET    Component Value Date/Time   NA 133 (L) 11/17/2023 0245   K 3.8 11/17/2023 0245   CL 91 (L) 11/17/2023 0245   CO2 21 (L) 11/17/2023 0245   GLUCOSE 163 (H) 11/17/2023 0245   BUN 49 (H) 11/17/2023 0245   CREATININE 11.15 (H) 11/17/2023 0245   CALCIUM  9.8 11/17/2023 0245   GFRNONAA 5 (L) 11/17/2023 0245   GFRAA 3 (L)  08/14/2019 2051   CBC    Component Value Date/Time   WBC 7.8 11/17/2023 0245   RBC 5.75 11/17/2023 0245   HGB 16.5 11/17/2023 0245   HGB 13.0 12/17/2022 1242   HCT 50.0 11/17/2023 0245   PLT 383 11/17/2023 0245   PLT 272 12/17/2022 1242   MCV 87.0 11/17/2023 0245   MCH 28.7 11/17/2023 0245   MCHC 33.0 11/17/2023 0245   RDW 14.8 11/17/2023 0245   LYMPHSABS 1.7 11/17/2023 0245   MONOABS 1.5 (H) 11/17/2023 0245   EOSABS 0.3 11/17/2023 0245   BASOSABS 0.1 11/17/2023 0245    Outpt Dialysis Orders:  Center: [facility] 4 hr 15 min 180NRe 450/700 105 kg 2.0 K/ 2.0 Ca UFP 4 AVF - Heparin  7000 units IV Initial bolus Heparin  2000  units IV mid run - Hectorol  7 mcg IV three times per week   Assessment/Plan: 1.AMS: Was on cefepime  which has been stopped. Per primary team some concern for encephalitis/meningitis, is on empiric antibiotics/antivirals.  Recommend ID consult and LP. 2.  ESRD - Typically on MWF schedule. Had serial HD last week 12/24-12/26, last HD 12/28. Does not appear volume overloaded on exam. Will plan for next HD today.  3.NSTEMI-work up per cardiology/primary.  4.Hyperkalemia- Resolved with HD.  5.Fever/Hypotension- BC remain no growth so far. Has been started on board spectrum antibiotics 6. HFrEF/history of severe AS-attempt to optimize volume with dialysis. ECHO  EF 30 to 35%. Cards following. Fluid restrictions and daily weights. On midodrine  10 mg 3 times daily 7. Anemia  - Hgb above goal, no ESA indicated at this time.  8. Metabolic bone disease -Phos at goal. Continue renvela . VDRA on hold d/t high calcium .  9. DMT2-per primary   Fairy RONAL Sellar, MD [facility]

## 2023-11-17 NOTE — Progress Notes (Signed)
 EEG complete - results pending

## 2023-11-17 NOTE — [place] FL2 (Signed)
 [place]  MEDICAID FL2 LEVEL OF CARE FORM     IDENTIFICATION  Patient Name: [name] FILLER Sr. Birthdate: 1961/03/22 Sex: male Admission Date (Current Location): 11/08/2023  [facility] and [place] Number:  Producer, Television/film/video and Address:  [facility] [place], [address] [place], [place] [address]      Provider Number: [id number]  Attending Physician Name and Address:  Perri DELENA Meliton Mickey., *  Relative Name and Phone Number:       Current Level of Care: Hospital Recommended Level of Care: Skilled Nursing Facility Prior Approval Number:    Date Approved/Denied:   PASRR Number: [id number]  Discharge Plan:      Current Diagnoses: Patient Active Problem List   Diagnosis Date Noted   Aortic valve stenosis 11/15/2023   Atrial flutter with rapid ventricular response (HCC) 11/10/2023   NSTEMI (non-ST elevated myocardial infarction) (HCC) 11/08/2023   Hyperglycemia 10/24/2023   Abnormal EKG 10/24/2023   RUQ abdominal pain 10/30/2022   Cholelithiasis 10/29/2022   Hyperbilirubinemia 10/29/2022   Volume overload 10/19/2022   CHF (congestive heart failure) (HCC) 10/18/2022   Hyperosmolar hyperglycemic state (HHS) (HCC)    Diarrhea    Dehiscence of amputation stump of right lower extremity (HCC)    Abscess of right lower leg    Pressure injury of skin 07/02/2022   Cellulitis of right leg 07/01/2022   CAD (coronary artery disease) 05/24/2022   Leukocytosis 05/24/2022   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/23/2022   Obesity (BMI 30-39.9) 02/19/2022   Hypotension 02/08/2022   History of CVA  02/05/2022   Carotid stenosis 02/05/2022   Hyperkalemia 02/03/2022   acute on chronic Anemia of chronic renal failure  02/03/2022   Subacute osteomyelitis, right ankle and foot (HCC) 02/02/2022   Acute metabolic encephalopathy 02/02/2022   Sepsis with encephalopathy and septic shock (HCC) 02/02/2022   Protein-calorie malnutrition, mild (HCC)  02/02/2022   GERD (gastroesophageal reflux disease) 02/02/2022   Acute on chronic systolic CHF (congestive heart failure) (HCC) 08/14/2019   LBBB (left bundle branch block) 08/13/2019   Problem with vascular access 08/13/2019   Dialysis AV fistula malfunction (HCC) 08/12/2019   ESRD on dialysis (HCC) 09/20/2018   DM2 (diabetes mellitus, type 2) (HCC) 09/20/2018   HTN (hypertension) 09/20/2018    Orientation RESPIRATION BLADDER Height & Weight     Self  Normal Continent Weight: 222 lb 10.6 oz (101 kg) Height:  6' 1 (185.4 cm)  BEHAVIORAL SYMPTOMS/MOOD NEUROLOGICAL BOWEL NUTRITION STATUS      Incontinent Diet (See dc summary)  AMBULATORY STATUS COMMUNICATION OF NEEDS Skin   Extensive Assist Verbally Surgical wounds (Wound / Incision Skin tear Perineum Medial 0.5 cm x 2 cm)                       Personal Care Assistance Level of Assistance  Bathing, Feeding, Dressing Bathing Assistance: Maximum assistance Feeding assistance: Limited assistance Dressing Assistance: Maximum assistance     Functional Limitations Info  Sight, Speech, Hearing Sight Info: Impaired (Glasses) Hearing Info: Adequate Speech Info: Impaired (Delayed speech)    SPECIAL CARE FACTORS FREQUENCY  PT (By licensed PT), OT (By licensed OT), Speech therapy     PT Frequency: 5x week OT Frequency: 5x week     Speech Therapy Frequency: 5x week      Contractures Contractures Info: Not present    Additional Factors Info  Code Status, Allergies, Isolation Precautions, Insulin  Sliding Scale Code Status  Info: Full Allergies Info: Sulfa Antibiotics   Insulin  Sliding Scale Info: See dc summary Isolation Precautions Info: MRSA, Airborne and Contact precautions     Current Medications (11/17/2023):  This is the current hospital active medication list Current Facility-Administered Medications  Medication Dose Route Frequency Provider Last Rate Last Admin   acetaminophen  (TYLENOL ) tablet 650 mg  650 mg  Oral Q6H PRN Arrien, Mauricio Daniel, MD   650 mg at 11/13/23 0411   Or   acetaminophen  (TYLENOL ) suppository 650 mg  650 mg Rectal Q6H PRN Noralee Elidia Sieving, MD   650 mg at 11/10/23 2045   acyclovir  (ZOVIRAX ) 450 mg in dextrose  5 % 100 mL IVPB  5 mg/kg (Adjusted) Intravenous Q24H Perri DELENA Meliton Mickey., MD 109 mL/hr at 11/16/23 1743 450 mg at 11/16/23 1743   ampicillin  (OMNIPEN) 2 g in sodium chloride  0.9 % 100 mL IVPB  2 g Intravenous Q12H Perri DELENA Meliton Mickey., MD 300 mL/hr at 11/16/23 2257 2 g at 11/16/23 2257   aspirin  chewable tablet 81 mg  81 mg Oral Daily Arrien, Elidia Sieving, MD   81 mg at 11/16/23 9140   atorvastatin  (LIPITOR) tablet 40 mg  40 mg Oral Daily Arrien, Elidia Sieving, MD   40 mg at 11/16/23 9140   cefTRIAXone  (ROCEPHIN ) 2 g in sodium chloride  0.9 % 100 mL IVPB  2 g Intravenous Q12H Perri DELENA Meliton Mickey., MD 200 mL/hr at 11/16/23 2333 2 g at 11/16/23 2333   cinacalcet  (SENSIPAR ) tablet 30 mg  30 mg Oral Q breakfast Arrien, Mauricio Daniel, MD   30 mg at 11/16/23 9140   doxercalciferol  (HECTOROL ) injection 3 mcg  3 mcg Intravenous Q M,W,F-HD Zeyfang, David, PA-C       heparin  ADULT infusion 100 units/mL (25000 units/250mL)  1,600 Units/hr Intravenous Continuous Utomwen, Adesuwa, RPH 14 mL/hr at 11/16/23 1706 1,400 Units/hr at 11/16/23 1706   heparin  injection 1,000 Units  1,000 Units Intracatheter PRN Delores Ricka Maidens, NP       heparin  injection 5,000 Units  5,000 Units Dialysis Once in dialysis Delores Ricka Maidens, NP       insulin  aspart (novoLOG ) injection 0-5 Units  0-5 Units Subcutaneous QHS Bryn Bernardino NOVAK, MD       insulin  aspart (novoLOG ) injection 0-9 Units  0-9 Units Subcutaneous TID WC Bryn Bernardino NOVAK, MD   2 Units at 11/16/23 1701   insulin  glargine-yfgn (SEMGLEE ) injection 12 Units  12 Units Subcutaneous Daily Sira, Zackery, MD   12 Units at 11/16/23 0859   lidocaine  (PF) (XYLOCAINE ) 1 % injection 5 mL  5 mL Intradermal PRN Delores Ricka Maidens, NP        lidocaine -prilocaine  (EMLA ) cream 1 Application  1 Application Topical PRN Brown, Rita Harbison, NP       midodrine  (PROAMATINE ) tablet 10 mg  10 mg Oral TID WC Brown, Rita Harbison, NP   10 mg at 11/16/23 1745   ondansetron  (ZOFRAN ) tablet 4 mg  4 mg Oral Q6H PRN Arrien, Mauricio Daniel, MD       Or   ondansetron  (ZOFRAN ) injection 4 mg  4 mg Intravenous Q6H PRN Arrien, Mauricio Daniel, MD       Oral care mouth rinse  15 mL Mouth Rinse PRN Arrien, Elidia Sieving, MD       Oral care mouth rinse  15 mL Mouth Rinse PRN Sira, Zackery, MD       Oral care mouth rinse  15 mL Mouth Rinse PRN Sira, Zackery, MD  oseltamivir  (TAMIFLU ) capsule 30 mg  30 mg Oral Once Perri DELENA Meliton Mickey., MD       oseltamivir  (TAMIFLU ) capsule 30 mg  30 mg Oral Q T,Th,Sat-1800 Perri DELENA Meliton Mickey., MD       pentafluoroprop-tetrafluoroeth [facility]) aerosol 1 Application  1 Application Topical PRN Brown, Rita Harbison, NP       sevelamer  carbonate (RENVELA ) tablet 2,400 mg  2,400 mg Oral BID PRN Arrien, Mauricio Daniel, MD       sevelamer  carbonate (RENVELA ) tablet 4,000 mg  4,000 mg Oral TID WC Arrien, Mauricio Daniel, MD   4,000 mg at 11/16/23 9141   vancomycin  (VANCOCIN ) IVPB 1000 mg/200 mL premix  1,000 mg Intravenous Q M,W,F-HD Perri DELENA Meliton Mickey., MD         Discharge Medications: Please see discharge summary for a list of discharge medications.  Relevant Imaging Results:  Relevant Lab Results:   Additional Information SS#: [id number]; HD pt - [facility] on MWF  [place] Banas C Isatou Agredano, LCSWA

## 2023-11-17 NOTE — Progress Notes (Signed)
 Physical Therapy Treatment Patient Details Name: JAICOB DIA Sr. MRN: [id number] DOB: 05-Jun-1961 Today's Date: 11/17/2023   History of Present Illness Pt is 62 year old presented to [facility] on  11/08/23 after experiencing a near syncope during HD. Pt being worked up for an NSTEMI with planned LHC and RHC possibly 11/16/23, and fever with acute metabolic encephalopathy.  Pt recently admitted 12/6-12/10/24 for a fall. PMH - htn, cardiomyopathy, CHF, dm, esrd on hd, R BKA, CVA    PT Comments  Pt resting supine in bed with Lt LE hanging off EOB and pt crooked when therapist arrived. Pt required Max assist with bed pad to reposition and boost slightly. Pt not following any commands (verbal/tactile/visual). Pt not attending to voice of visual cues and only responding to Lt hand touch as painful ~60% of time. Pt noted to be soiled and +2 Max assist required to roll Rt/Lt and complete pericare and linen change. Pt repositioned in partial chair position and. At end of session pt appeared to track eyes to therapist briefly and attempted to assess if pt able to follow cues for thumbs up with Lt hand. Pt inconsistent but not grimacing in pain. Will continue to assess pt's ability to participate in skilled therapy and progress pt as able. Alarm on, bed in low position, call bell within reach, and fall pad on floor.   If plan is discharge home, recommend the following: Two people to help with walking and/or transfers;Two people to help with bathing/dressing/bathroom;Assistance with cooking/housework;Assistance with feeding;Direct supervision/assist for medications management;Direct supervision/assist for financial management;Assist for transportation;Help with stairs or ramp for entrance;Supervision due to cognitive status   Can travel by private vehicle        Equipment Recommendations  None recommended by PT (TBD)    Recommendations for Other Services       Precautions / Restrictions  Precautions Precautions: Fall Required Braces or Orthoses: Other Brace Other Brace: R prosthetic (not sure if fitting) Restrictions Weight Bearing Restrictions Per Provider Order: No     Mobility  Bed Mobility Overal bed mobility: Needs Assistance Bed Mobility: Rolling Rolling: Max assist, +2 for physical assistance, +2 for safety/equipment, Used rails         General bed mobility comments: Max +2 for safety with rolling and repositioning to boost superiorly in bed.    Transfers                   General transfer comment: unsafe to attempt    Ambulation/Gait                   Stairs             Wheelchair Mobility     Tilt Bed    Modified Rankin (Stroke Patients Only)          Cognition Arousal: Alert, Lethargic Behavior During Therapy: Flat affect, Restless Overall Cognitive Status: Impaired/Different from baseline Area of Impairment: Orientation, Attention, Memory, Following commands, Safety/judgement, Awareness, Problem solving                 Orientation Level: Disoriented to, Place, Person, Time, Situation Current Attention Level: Focused Memory: Decreased short-term memory, Decreased recall of precautions Following Commands:  (not following any commands today despite extra time and multimodal cues) Safety/Judgement: Decreased awareness of safety, Decreased awareness of deficits Awareness: Intellectual Problem Solving: Slow processing, Difficulty sequencing, Requires verbal cues, Decreased initiation, Requires tactile cues (not following any cues today)  Exercises      General Comments General comments (skin integrity, edema, etc.): skin breakdown noted in superior gluteal cleft. sacral patch replaced. Rn present and aware.      Pertinent Vitals/Pain Pain Assessment Pain Assessment: Faces Faces Pain Scale: Hurts even more Pain Location: Lt hand with any touch but pt also groaning in pain with general  mobility. pt inconsistent with response and touch and unable to verbalize where. Pain Descriptors / Indicators: Grimacing, Guarding, Moaning Pain Intervention(s): Limited activity within patient's tolerance, Monitored during session, Repositioned     PT Goals (current goals can now be found in the care plan section) Acute Rehab PT Goals Patient Stated Goal: unable to state PT Goal Formulation: Patient unable to participate in goal setting Time For Goal Achievement: 11/28/23 Potential to Achieve Goals: Fair Progress towards PT goals: Not progressing toward goals - comment    Frequency    Min 1X/week      PT Plan      Co-evaluation              AM-PAC PT 6 Clicks Mobility   Outcome Measure  Help needed turning from your back to your side while in a flat bed without using bedrails?: Total Help needed moving from lying on your back to sitting on the side of a flat bed without using bedrails?: Total Help needed moving to and from a bed to a chair (including a wheelchair)?: Total Help needed standing up from a chair using your arms (e.g., wheelchair or bedside chair)?: Total Help needed to walk in hospital room?: Total Help needed climbing 3-5 steps with a railing? : Total 6 Click Score: 6    End of Session Equipment Utilized During Treatment: Gait belt;Other (comment) Activity Tolerance: Other (comment) (limited by cognition) Patient left: in bed;with call bell/phone within reach;with bed alarm set;with SCD's reapplied (fall pad on floor) Nurse Communication: Need for lift equipment;Mobility status PT Visit Diagnosis: Muscle weakness (generalized) (M62.81);Difficulty in walking, not elsewhere classified (R26.2);Pain Pain - Right/Left: Left Pain - part of body: Hand     Time: 8567-8542 PT Time Calculation (min) (ACUTE ONLY): 25 min  Charges:    $Therapeutic Activity: 23-37 mins PT General Charges $$ ACUTE PT VISIT: 1 Visit                     Vernell DONEEN KLEIN,  DPT Acute Rehabilitation Services Office [phone]  11/17/23 3:35 PM

## 2023-11-18 ENCOUNTER — Inpatient Hospital Stay (HOSPITAL_COMMUNITY): Payer: Medicare Other

## 2023-11-18 DIAGNOSIS — G9340 Encephalopathy, unspecified: Secondary | ICD-10-CM

## 2023-11-18 DIAGNOSIS — R569 Unspecified convulsions: Secondary | ICD-10-CM | POA: Diagnosis not present

## 2023-11-18 DIAGNOSIS — I214 Non-ST elevation (NSTEMI) myocardial infarction: Secondary | ICD-10-CM | POA: Diagnosis not present

## 2023-11-18 DIAGNOSIS — G9341 Metabolic encephalopathy: Secondary | ICD-10-CM | POA: Diagnosis not present

## 2023-11-18 LAB — RENAL FUNCTION PANEL
Albumin: 2.8 g/dL — ABNORMAL LOW (ref 3.5–5.0)
Anion gap: 20 — ABNORMAL HIGH (ref 5–15)
BUN: 63 mg/dL — ABNORMAL HIGH (ref 8–23)
CO2: 21 mmol/L — ABNORMAL LOW (ref 22–32)
Calcium: 9.9 mg/dL (ref 8.9–10.3)
Chloride: 95 mmol/L — ABNORMAL LOW (ref 98–111)
Creatinine, Ser: 14.18 mg/dL — ABNORMAL HIGH (ref 0.61–1.24)
GFR, Estimated: 4 mL/min — ABNORMAL LOW (ref >60)
Glucose, Bld: 158 mg/dL — ABNORMAL HIGH (ref 70–99)
Phosphorus: 6.1 mg/dL — ABNORMAL HIGH (ref 2.5–4.6)
Potassium: 4.3 mmol/L (ref 3.5–5.1)
Sodium: 136 mmol/L (ref 135–145)

## 2023-11-18 LAB — CBC WITH DIFFERENTIAL/PLATELET
Abs Immature Granulocytes: 0.1 10*3/uL — ABNORMAL HIGH (ref 0.00–0.07)
Basophils Absolute: 0.1 10*3/uL (ref 0.0–0.1)
Basophils Relative: 1 %
Eosinophils Absolute: 0.3 10*3/uL (ref 0.0–0.5)
Eosinophils Relative: 4 %
HCT: 48.1 % (ref 39.0–52.0)
Hemoglobin: 15.8 g/dL (ref 13.0–17.0)
Immature Granulocytes: 1 %
Lymphocytes Relative: 17 %
Lymphs Abs: 1.3 10*3/uL (ref 0.7–4.0)
MCH: 28.5 pg (ref 26.0–34.0)
MCHC: 32.8 g/dL (ref 30.0–36.0)
MCV: 86.7 fL (ref 80.0–100.0)
Monocytes Absolute: 1.7 10*3/uL — ABNORMAL HIGH (ref 0.1–1.0)
Monocytes Relative: 24 %
Neutro Abs: 3.9 10*3/uL (ref 1.7–7.7)
Neutrophils Relative %: 53 %
Platelets: 454 10*3/uL — ABNORMAL HIGH (ref 150–400)
RBC: 5.55 MIL/uL (ref 4.22–5.81)
RDW: 14.8 % (ref 11.5–15.5)
WBC: 7.4 10*3/uL (ref 4.0–10.5)
nRBC: 1.1 % — ABNORMAL HIGH (ref 0.0–0.2)

## 2023-11-18 LAB — GLUCOSE, CAPILLARY
Glucose-Capillary: 184 mg/dL — ABNORMAL HIGH (ref 70–99)
Glucose-Capillary: 160 mg/dL — ABNORMAL HIGH (ref 70–99)
Glucose-Capillary: 139 mg/dL — ABNORMAL HIGH (ref 70–99)
Glucose-Capillary: 93 mg/dL (ref 70–99)

## 2023-11-18 MED ORDER — OSELTAMIVIR PHOSPHATE 30 MG PO CAPS
30.0000 mg | ORAL_CAPSULE | ORAL | Status: AC
  Filled 2023-11-18 (×2): qty 1

## 2023-11-18 NOTE — Plan of Care (Signed)
  Problem: Clinical Measurements: Goal: Respiratory complications will improve Outcome: Progressing   Problem: Elimination: Goal: Will not experience complications related to bowel motility Outcome: Progressing   Problem: Education: Goal: Knowledge of General Education information will improve Description: Including pain rating scale, medication(s)/side effects and non-pharmacologic comfort measures Outcome: Not Progressing   Problem: Clinical Measurements: Goal: Ability to maintain clinical measurements within normal limits will improve Outcome: Not Progressing Goal: Will remain free from infection Outcome: Not Progressing   Problem: Activity: Goal: Risk for activity intolerance will decrease Outcome: Not Progressing   Problem: Pain Management: Goal: General experience of comfort will improve Outcome: Not Progressing

## 2023-11-18 NOTE — Progress Notes (Signed)
 Pt was too lethargic and confused to take medications. He was occasionally hollering. He didn't take any food or drink today due to altered mental status. Notified MD

## 2023-11-18 NOTE — Progress Notes (Addendum)
 LTM maint complete - no skin breakdown seen. Serviced Pz A2 , reapplied EEG ecg leads, placed trip pad over power and breakout cord and secured C3 C4 P3 Impedance below 10kOhms [facility] monitored, Event button test confirmed by [facility].

## 2023-11-18 NOTE — Progress Notes (Signed)
 vLTM started  All impedances below 10kohms.  Patient event  button tested.  [facility] notified.

## 2023-11-18 NOTE — Consult Note (Signed)
 [facility]       Reason for Consult: encephalopathy    Referring Physician: Dr. Briana  Principal Problem:   NSTEMI (non-ST elevated myocardial infarction) [facility]) Active Problems:   ESRD on dialysis (HCC)   DM2 (diabetes mellitus, type 2) (HCC)   HTN (hypertension)   Acute on chronic systolic CHF (congestive heart failure) (HCC)   Acute metabolic encephalopathy   Sepsis with encephalopathy and septic shock (HCC)   Protein-calorie malnutrition, mild (HCC)   History of CVA    Hypotension   Obesity (BMI 30-39.9)   Atrial flutter with rapid ventricular response (HCC)   Aortic valve stenosis    aspirin   81 mg Oral Daily   atorvastatin   40 mg Oral Daily   cinacalcet   30 mg Oral Q breakfast   doxercalciferol   3 mcg Intravenous Q M,W,F-HD   heparin  injection (subcutaneous)  5,000 Units Subcutaneous Q8H   insulin  aspart  0-5 Units Subcutaneous QHS   insulin  aspart  0-9 Units Subcutaneous TID WC   insulin  glargine-yfgn  12 Units Subcutaneous Daily   midodrine   10 mg Oral TID WC   oseltamivir   30 mg Oral Q M,W,F-HD   sevelamer  carbonate  4,000 mg Oral TID WC    Recommendations: Stop antibiotics D/c isolation precautions I agree with need for LP now that he is off heparin   Assessment: He has encephalopathy with generalized slowing on EEG, no further fever, MRI findings that are not concerning for encephalitis and no nuchal rigidity noted before or on my current exam and clinically then would be less likely or certainly atypical for meningitis.  Encephalitis is possible with the encephalopathy and high fever however MRI findings do not support an encephalitis and despite treatment with antiviral, defervescent's of his fever, and treatment he continues to be encephalopathic.  LP though still would be beneficial as an infectious viral cause likely would still be positive on the CSF panel.  I do not see any rash concerning for zoster.  He is flu positive.      I am unclear of what his rash is, though dry and not zoster-like.  It is pretty diffuse.  No previous mention of it so not sure how long it has been present.    HPI: [name] NAVAL Sr. is a 63 y.o. male with a history of end-stage renal disease on intermittent hemodialysis, diabetes, history of right BKA who had been at dialysis and had a presyncopal event.  The patient is not able to give any history.  He was in dialysis on December 22 and noted to have some hypotension and chest pain with the presyncopal event and EMS was called.  Blood pressure was low on presentation though did improve but also found to have increased troponin and concern for an NSTEMI.  He was started on a heparin  drip and noted to be encephalopathic.  He was started on broad-spectrum antibiotics for concern for infection.  He did have an initial fever up to 104.  There was concern for a septic presentation however he has not required pressor support, lactic acid was not significantly elevated and blood cultures have remained negative.  He continued to remain altered with no responsiveness and was seen by neurology on December 26.  They did not note any nuchal rigidity or concerning signs on exam and felt to be consistent with septic encephalopathy, and most likely meningitis so was started on empiric antibacterial treatment along with acyclovir .  LP was not  able to be performed due to being on heparin  drip for his cardiac issues.  And he was put into isolation presumably for meningitis, though I do not see any mention of the indication for isolation.  Review of Systems:  Unable to be assessed due to mental status  Past Medical History:  Diagnosis Date   ESRD on hemodialysis (HCC)    a. HD MWF > 10 years.   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 01/2022 Echo: EF 45-50%, glob HK, mod LVH, mildly reduced RV fxn, mild MR, mod-sev MV Ca2+. Mild AS; b. 10/2023 Echo: EF 45-50%, glob HK, GrII DD, mildly reduced RV  fxn, RVSP 52.54mmHg. Mild MR/MS, mod TR.   Hypertension    Nonischemic cardiomyopathy (HCC)    a. 2019 Cath [facility]): Nonobstructive disease; b. 12/2019 Echo: EF 50%; c. 12/2019 MV (Tx w/u [facility]): Ant infarct w/ peri-infarct ischemia. EF 35%; c. 05/2021 MV (Tx w/u [facility]): Cor Ca2+, EF 53%, motion artifact, equiv study, likely low risk.   Nonobstructive coronary artery disease    a.  2019 Cath ([facility]): Left main normal, LAD 30 proximal, RCA 30 distal, EF 25%.   Obesity    Type 2 diabetes mellitus (HCC)    a. 10/2023 - admission w/ HHS.    Social History   Tobacco Use   Smoking status: Never   Smokeless tobacco: Never  Vaping Use   Vaping status: Never Used  Substance Use Topics   Alcohol use: Not Currently   Drug use: Never    Family History  Problem Relation Age of Onset   Hypertension Mother     Allergies  Allergen Reactions   Sulfa Antibiotics Diarrhea and Nausea And Vomiting    Physical Exam: Constitutional: disoriented  Vitals:   11/18/23 0849 11/18/23 1210  BP: 123/81 (!) 106/55  Pulse:  82  Resp: 19 20  Temp:  (!) 97.5 F (36.4 C)  SpO2:  92%   EYES: anicteric Respiratory: normal respiratory effort Musculoskeletal: no edema Skin: multiple papular hyperpigmented and dry skin lesions  Lab Results  Component Value Date   WBC 7.8 11/17/2023   HGB 16.5 11/17/2023   HCT 50.0 11/17/2023   MCV 87.0 11/17/2023   PLT 383 11/17/2023    Lab Results  Component Value Date   CREATININE 11.15 (H) 11/17/2023   BUN 49 (H) 11/17/2023   NA 133 (L) 11/17/2023   K 3.8 11/17/2023   CL 91 (L) 11/17/2023   CO2 21 (L) 11/17/2023    Lab Results  Component Value Date   ALT 23 11/17/2023   AST 27 11/17/2023   GGT 293 (H) 10/31/2022   ALKPHOS 78 11/17/2023     Microbiology: Recent Results (from the past 240 hours)  MRSA Next Gen by PCR, Nasal     Status: Abnormal   Collection Time: 11/09/23  4:15 PM   Specimen: Nasal Mucosa; Nasal Swab  Result  Value Ref Range Status   MRSA by PCR Next Gen DETECTED (A) NOT DETECTED Final    Comment: RESULT CALLED TO, READ BACK BY AND VERIFIED WITH: C HALL,RN@[id number]  11/09/23 MK (NOTE) The GeneXpert MRSA Assay (FDA approved for NASAL specimens only), is one component of a comprehensive MRSA colonization surveillance program. It is not intended to diagnose MRSA infection nor to guide or monitor treatment for MRSA infections. Test performance is not FDA approved in patients less than 60 years old. Performed at [facility], [address], [place], [place] [address]   Culture, blood (Routine X 2) w Reflex to ID Panel     Status: None   Collection Time: 11/10/23  3:03 PM   Specimen: BLOOD RIGHT ARM  Result Value Ref Range Status   Specimen Description BLOOD RIGHT ARM  Final   Special Requests   Final    BOTTLES DRAWN AEROBIC AND ANAEROBIC Blood Culture results may not be optimal due to an inadequate volume of blood received in culture bottles   Culture   Final    NO GROWTH 5 DAYS Performed at [facility], [address] [place], [place] [address]    Report Status 11/15/2023 FINAL  Final  Culture, blood (Routine X 2) w Reflex to ID Panel     Status: None   Collection Time: 11/10/23  3:08 PM   Specimen: BLOOD RIGHT ARM  Result Value Ref Range Status   Specimen Description BLOOD RIGHT ARM  Final   Special Requests   Final    BOTTLES DRAWN AEROBIC ONLY Blood Culture results may not be optimal due to an inadequate volume of blood received in culture bottles   Culture   Final    NO GROWTH 5 DAYS Performed at [facility], [address] [place], [place] [address]    Report Status 11/15/2023 FINAL  Final  C Difficile Quick Screen w PCR reflex     Status: None   Collection Time: 11/12/23  8:37 AM   Specimen: STOOL  Result Value Ref Range Status   C Diff antigen NEGATIVE NEGATIVE Final   C Diff toxin NEGATIVE NEGATIVE Final   C Diff interpretation No C. difficile detected.   Final    Comment: Performed at [facility], [address], [place], [place] [address]  Respiratory (~20 pathogens) panel by PCR     Status: Abnormal   Collection Time: 11/16/23  2:32 PM   Specimen: Nasopharyngeal Swab; Respiratory  Result Value Ref Range Status   Adenovirus NOT DETECTED NOT DETECTED Final   Coronavirus 229E NOT DETECTED NOT DETECTED Final    Comment: (NOTE) The Coronavirus on the Respiratory Panel, DOES NOT test for the novel  Coronavirus (2019 nCoV)    Coronavirus HKU1 NOT DETECTED NOT DETECTED Final   Coronavirus NL63 NOT DETECTED NOT DETECTED Final   Coronavirus OC43 NOT DETECTED NOT DETECTED Final   Metapneumovirus NOT DETECTED NOT DETECTED Final   Rhinovirus / Enterovirus NOT DETECTED NOT DETECTED Final   Influenza A H1 2009 DETECTED (A) NOT DETECTED Final   Influenza B NOT DETECTED NOT DETECTED Final   Parainfluenza Virus 1 NOT DETECTED NOT DETECTED Final   Parainfluenza Virus 2 NOT DETECTED NOT DETECTED Final   Parainfluenza Virus 3 NOT DETECTED NOT DETECTED Final   Parainfluenza Virus 4 NOT DETECTED NOT DETECTED Final   Respiratory Syncytial Virus NOT DETECTED NOT DETECTED Final   Bordetella pertussis NOT DETECTED NOT DETECTED Final   Bordetella Parapertussis NOT DETECTED NOT DETECTED Final   Chlamydophila pneumoniae NOT DETECTED NOT DETECTED Final   Mycoplasma pneumoniae NOT DETECTED NOT DETECTED Final    Comment: Performed at [facility], [address] [place], [place] [address]  SARS Coronavirus 2 by RT PCR (hospital order, performed in [facility] hospital lab) *cepheid single result test* Anterior Nasal Swab     Status: None   Collection Time: 11/16/23  2:32 PM   Specimen: Anterior Nasal Swab  Result Value Ref Range Status   SARS  Coronavirus 2 by RT PCR NEGATIVE NEGATIVE Final    Comment: Performed at [facility], [address] [place], [place] [address]    Lamar LELON Bucks, MD [email or web address] 11/18/2023, 12:36 PM

## 2023-11-18 NOTE — Progress Notes (Signed)
 Patient ID: [name] [name], male   DOB: September 27, 1961, 63 y.o.   MRN: [id number] S: Pt nonverbal this am and not responsive to voice or tactile stimuli.  EEG probes in place O:BP 123/81   Pulse 80   Temp (!) 97.5 F (36.4 C) (Axillary)   Resp 19   Ht 6' 1 (1.854 m)   Wt 102.2 kg   SpO2 100%   BMI 29.73 kg/m   Intake/Output Summary (Last 24 hours) at 11/18/2023 0951 Last data filed at 11/18/2023 0545 Gross per 24 hour  Intake 200 ml  Output --  Net 200 ml   Intake/Output: I/O last 3 completed shifts: In: 200 [IV Piggyback:200] Out: -   Intake/Output this shift:  No intake/output data recorded. Weight change:  Gen: nonverbal and unresponsive CVS: RRR Resp: CTA Abd: +BS, soft, NT/ND Ext: no edema, s/p RBKA, LAVF +T/B  Recent Labs  Lab 11/11/23 1647 11/12/23 0850 11/13/23 0816 11/14/23 0332 11/15/23 0316 11/16/23 0402 11/17/23 0245  NA 127* 132* 130* 129* 130* 132* 133*  K 6.3* 4.6 3.9 4.1 3.6 3.9 3.8  CL 90* 93* 91* 91* 90* 94* 91*  CO2 21* 21* 23 20* 22 17* 21*  GLUCOSE 275* 214* 258* 277* 184* 146* 163*  BUN 56* 35* 30* 40* 28* 41* 49*  CREATININE 11.53* 8.39* 7.21* 9.31* 7.77* 9.70* 11.15*  ALBUMIN  3.3* 3.1* 2.9* 2.7* 3.1* 2.9* 2.9*  CALCIUM  10.1 9.6 9.2 9.5 9.7 9.8 9.8  PHOS 7.2* 6.0* 4.0 4.8* 4.1 4.6 5.1*  AST  --   --   --   --   --   --  27  ALT  --   --   --   --   --   --  23   Liver Function Tests: Recent Labs  Lab 11/15/23 0316 11/16/23 0402 11/17/23 0245  AST  --   --  27  ALT  --   --  23  ALKPHOS  --   --  78  BILITOT  --   --  0.9  PROT  --   --  8.6*  ALBUMIN  3.1* 2.9* 2.9*   No results for input(s): LIPASE, AMYLASE in the last 168 hours. Recent Labs  Lab 11/16/23 1518  AMMONIA 36*   CBC: Recent Labs  Lab 11/14/23 0332 11/14/23 1654 11/15/23 0316 11/16/23 0402 11/17/23 0245  WBC 6.4 5.2 6.5 7.9 7.8  NEUTROABS  --   --   --   --  4.0  HGB 14.7 16.0 15.7 16.7 16.5  HCT 46.4 49.9 48.1 51.0 50.0  MCV 88.5 88.9 87.5 87.5  87.0  PLT 169 167 212 273 383   Cardiac Enzymes: No results for input(s): CKTOTAL, CKMB, CKMBINDEX, TROPONINI in the last 168 hours. CBG: Recent Labs  Lab 11/17/23 0645 11/17/23 1224 11/17/23 1731 11/17/23 2144 11/18/23 0645  GLUCAP 181* 218* 187* 158* 184*    Iron  Studies: No results for input(s): IRON , TIBC, TRANSFERRIN, FERRITIN in the last 72 hours. Studies/Results: CT HEAD WO CONTRAST ( ) Result Date: 11/18/2023 CLINICAL DATA:  Delirium EXAM: CT HEAD WITHOUT CONTRAST TECHNIQUE: Contiguous axial images were obtained from the base of the skull through the vertex without intravenous contrast. RADIATION DOSE REDUCTION: This exam was performed according to the departmental dose-optimization program which includes automated exposure control, adjustment of the mA and/or kV according to patient size and/or use of iterative reconstruction technique. COMPARISON:  Brain MRI 11/11/2023 FINDINGS: Brain: No evidence of acute infarction, hemorrhage, hydrocephalus, extra-axial  collection or mass lesion/mass effect. Chronic lacunar infarcts at the bilateral basal ganglia. Ischemic gliosis in the cerebral white matter is mild. Mild for age cerebral volume loss. Vascular: No hyperdense vessel or unexpected calcification. Skull: Normal. Negative for fracture or focal lesion. Sinuses/Orbits: No acute finding. IMPRESSION: No acute finding or change since brain MRI 11/11/2023. Electronically Signed   By: Dorn Roulette M.D.   On: 11/18/2023 09:02   DG CHEST PORT 1 VIEW Result Date: 11/16/2023 CLINICAL DATA:  Cough and congestion EXAM: PORTABLE CHEST 1 VIEW COMPARISON:  11/10/2019 FINDINGS: Check shadow is enlarged but stable. Aortic calcifications are again seen. Lungs are hypoinflated but clear. No focal infiltrate or effusion is noted. IMPRESSION: No acute abnormality noted. Electronically Signed   By: Oneil Devonshire M.D.   On: 11/16/2023 18:19    aspirin   81 mg Oral Daily   atorvastatin    40 mg Oral Daily   cinacalcet   30 mg Oral Q breakfast   doxercalciferol   3 mcg Intravenous Q M,W,F-HD   heparin  injection (subcutaneous)  5,000 Units Subcutaneous Q8H   insulin  aspart  0-5 Units Subcutaneous QHS   insulin  aspart  0-9 Units Subcutaneous TID WC   insulin  glargine-yfgn  12 Units Subcutaneous Daily   midodrine   10 mg Oral TID WC   oseltamivir   30 mg Oral Q T,Th,Sat-1800   sevelamer  carbonate  4,000 mg Oral TID WC    BMET    Component Value Date/Time   NA 133 (L) 11/17/2023 0245   K 3.8 11/17/2023 0245   CL 91 (L) 11/17/2023 0245   CO2 21 (L) 11/17/2023 0245   GLUCOSE 163 (H) 11/17/2023 0245   BUN 49 (H) 11/17/2023 0245   CREATININE 11.15 (H) 11/17/2023 0245   CALCIUM  9.8 11/17/2023 0245   GFRNONAA 5 (L) 11/17/2023 0245   GFRAA 3 (L) 08/14/2019 2051   CBC    Component Value Date/Time   WBC 7.8 11/17/2023 0245   RBC 5.75 11/17/2023 0245   HGB 16.5 11/17/2023 0245   HGB 13.0 12/17/2022 1242   HCT 50.0 11/17/2023 0245   PLT 383 11/17/2023 0245   PLT 272 12/17/2022 1242   MCV 87.0 11/17/2023 0245   MCH 28.7 11/17/2023 0245   MCHC 33.0 11/17/2023 0245   RDW 14.8 11/17/2023 0245   LYMPHSABS 1.7 11/17/2023 0245   MONOABS 1.5 (H) 11/17/2023 0245   EOSABS 0.3 11/17/2023 0245   BASOSABS 0.1 11/17/2023 0245    Outpt Dialysis Orders:  Center: [facility] 4 hr 15 min 180NRe 450/700 105 kg 2.0 K/ 2.0 Ca UFP 4 AVF - Heparin  7000 units IV Initial bolus Heparin  2000  units IV mid run - Hectorol  7 mcg IV three times per week   Assessment/Plan: 1.AMS: Was on cefepime  which has been stopped. Per primary team some concern for encephalitis/meningitis, is on empiric antibiotics/antivirals.  Recommend ID consult and LP.  Discussed with Dr. Briana who is in agreement.  To consult ID today.  2.  ESRD - Typically on MWF schedule. Had serial HD last week 12/24-12/26, last HD 12/28. Does not appear volume overloaded on exam. Pushed due to high census and need for  separate HD.  Will plan for HD today and will be back on his regular schedule.  3.NSTEMI-work up per cardiology/primary.  4.Hyperkalemia- Resolved with HD.  5.Fever/Hypotension- BC remain no growth so far. Has been started on board spectrum antibiotics/antivirals 6. HFrEF/history of severe AS-attempt to optimize volume with dialysis. ECHO  EF 30 to 35%.  Cards following. Fluid restrictions and daily weights. On midodrine  10 mg 3 times daily 7. Anemia  - Hgb above goal, no ESA indicated at this time.  8. Metabolic bone disease -Phos at goal. Continue renvela . VDRA on hold d/t high calcium .  9. DMT2-per primary   Fairy RONAL Sellar, MD [facility]

## 2023-11-18 NOTE — Progress Notes (Signed)
 NEUROLOGY CONSULT FOLLOW UP NOTE   Date of service: November 18, 2023 Patient Name: [name] HILGERT Sr. MRN:  [id number] DOB:  1961-11-01  Brief Review of HPI  63 year old male with a PMHx of ESRD on dialysis TTS, chronic HFmrEF, insulin -dependent diabetes, hypertension, PVD status post right BKA, history of stroke, hypergammaglobulinemia, hyperferritinemia, recent prior admissions for hyperglycemia with CBG > 600 in the setting of noncompliance with home insulin , followed by a subsequent admission for a fall and recurrent hyperglycemia with blood glucose of 472, QT prolongation, recent T wave inversions on EKG, who re-presented to the ED via EMS on 12/22 for severe hypotension after being called for unremitting coughing with near-syncope and SOB while at dialysis. Troponin was markedly elevated at 7689. Presentation was suspicious for NSTEMI. Cardiology was consulted and he was started on a heparin  drip in conjunction with IVF and ASA.   CT head showed no acute intracranial process, with chronic lacunar infarcts noted.    Overnight on 12/23 he became groggy and somnolent. Follow-up echocardiogram showed reduction in LVEF, now 30 to 35% range with global hypokinesis and basal inferoposterior akinesis. Aortic stenosis was potentially severe. Cardiology is considering cardiac catheterization for evaluation of coronary anatomy and further assessment of aortic stenosis, presuming mental status clears and he is able to consent.   On Wednesday (12/25) he was noted to be significantly worse in terms of cognition. After receiving dialysis overnight he was somewhat improved on Thursday's neurology assessment, being able to open eyes and answer with one-word replies, but was still somnolent.    Was improving on last neurology eval (12/27 remaining awake during the assessment without requiring constant stimulation, following simple commands) but mental status has continued to wax and wane, remaining largely  nonverbal, partially verbal but confused at best, limiting care due such as cardiac craterization as mental status would need to improve to pursue the same.   Interval Hx/subjective  - Heraprin drip stopped 12/31  - Cough congestion triggered repeat CXR (no acute abnormalities) and RVP (Flu A positive), for which tamiflu  was started   Vitals   Vitals:   11/18/23 0100 11/18/23 0121 11/18/23 0200 11/18/23 0254  BP:  120/69    Pulse:  80    Resp: 16 14 17 13   Temp:  (!) 97.5 F (36.4 C)    TempSrc:  Axillary    SpO2:  95%    Weight:      Height:         Body mass index is 29.38 kg/m.  Physical Exam   Physical Exam  HEENT: Normocephalic Lungs: Respirations unlabored Extremities: Warm and well perfused. Right BKA.    Neurological Examination Mental Status: Sleeping.  Opens eyes briefly to voice.  Nonverbal, but does make some high-pitched uncomfortable sounds intermittently. Cranial Nerves: II: PERRL.  III,IV, VI: Eyes are conjugate.  Intermittent downgaze.  Intermittently looks bilaterally.  Intermittent Bell's phenomenon as he tries to close his eyes.  VII: Grimace is symmetric VIII: Not clearly responding to examiner's voice IX,X: Gag reflex deferred XI: Head is midline Motor/sensory: Significant paratonia.  Grossly moving all 4 extremities equally responsive to noxious stim Has right BKA.  Sensory: Intact to gross touch x 4 Cerebellar: Unable to test today.  On exam 12/27 optic ataxia manifesting as difficulty moving his finger to target bilaterally Gait: Deferred  Medications  Current Facility-Administered Medications:    acetaminophen  (TYLENOL ) tablet 650 mg, 650 mg, Oral, Q6H PRN, 650 mg at 11/13/23 0411 **OR** acetaminophen  (  TYLENOL ) suppository 650 mg, 650 mg, Rectal, Q6H PRN, Arrien, Mauricio Daniel, MD, 650 mg at 11/10/23 2045   acyclovir  (ZOVIRAX ) 450 mg in dextrose  5 % 100 mL IVPB, 5 mg/kg (Adjusted), Intravenous, Q24H, Perri DELENA Meliton Mickey., MD, Last  Rate: 109 mL/hr at 11/17/23 1748, 450 mg at 11/17/23 1748   ampicillin  (OMNIPEN) 2 g in sodium chloride  0.9 % 100 mL IVPB, 2 g, Intravenous, Q12H, Perri DELENA Meliton Mickey., MD, Last Rate: 300 mL/hr at 11/17/23 2017, 2 g at 11/17/23 2017   aspirin  chewable tablet 81 mg, 81 mg, Oral, Daily, Arrien, Elidia Sieving, MD, 81 mg at 11/17/23 9048   atorvastatin  (LIPITOR) tablet 40 mg, 40 mg, Oral, Daily, Arrien, Mauricio Daniel, MD, 40 mg at 11/17/23 9047   cefTRIAXone  (ROCEPHIN ) 2 g in sodium chloride  0.9 % 100 mL IVPB, 2 g, Intravenous, Q12H, Perri DELENA Meliton Mickey., MD, Last Rate: 200 mL/hr at 11/17/23 2142, 2 g at 11/17/23 2142   cinacalcet  (SENSIPAR ) tablet 30 mg, 30 mg, Oral, Q breakfast, Arrien, Mauricio Daniel, MD, 30 mg at 11/17/23 9047   doxercalciferol  (HECTOROL ) injection 3 mcg, 3 mcg, Intravenous, Q M,W,F-HD, Skip Lenis, PA-C   heparin  injection 1,000 Units, 1,000 Units, Intracatheter, PRN, Delores Ricka Maidens, NP   heparin  injection 5,000 Units, 5,000 Units, Subcutaneous, Q8H, Acharya, Gayatri A, MD, 5,000 Units at 11/17/23 2143   insulin  aspart (novoLOG ) injection 0-5 Units, 0-5 Units, Subcutaneous, QHS, Bryn Bernardino NOVAK, MD   insulin  aspart (novoLOG ) injection 0-9 Units, 0-9 Units, Subcutaneous, TID WC, Bryn Bernardino NOVAK, MD, 2 Units at 11/17/23 1748   insulin  glargine-yfgn (SEMGLEE ) injection 12 Units, 12 Units, Subcutaneous, Daily, Sira, Zackery, MD, 12 Units at 11/17/23 1138   lidocaine  (PF) (XYLOCAINE ) 1 % injection 5 mL, 5 mL, Intradermal, PRN, Delores Ricka Maidens, NP   lidocaine -prilocaine  (EMLA ) cream 1 Application, 1 Application, Topical, PRN, Brown, Rita Harbison, NP   midodrine  (PROAMATINE ) tablet 10 mg, 10 mg, Oral, TID WC, Delores Ricka Maidens, NP, 10 mg at 11/17/23 1749   ondansetron  (ZOFRAN ) tablet 4 mg, 4 mg, Oral, Q6H PRN **OR** ondansetron  (ZOFRAN ) injection 4 mg, 4 mg, Intravenous, Q6H PRN, Arrien, Mauricio Daniel, MD   Oral care mouth rinse, 15 mL, Mouth Rinse, PRN, Arrien,  Mauricio Daniel, MD   Oral care mouth rinse, 15 mL, Mouth Rinse, PRN, Sira, Zackery, MD   Oral care mouth rinse, 15 mL, Mouth Rinse, PRN, Sira, Zackery, MD   oseltamivir  (TAMIFLU ) capsule 30 mg, 30 mg, Oral, Q T,Th,Sat-1800, Perri DELENA Meliton Mickey., MD, 30 mg at 11/17/23 1750   pentafluoroprop-tetrafluoroeth (GEBAUERS) aerosol 1 Application, 1 Application, Topical, PRN, Brown, Rita Harbison, NP   sevelamer  carbonate (RENVELA ) tablet 2,400 mg, 2,400 mg, Oral, BID PRN, Arrien, Mauricio Daniel, MD   sevelamer  carbonate (RENVELA ) tablet 4,000 mg, 4,000 mg, Oral, TID WC, Arrien, Mauricio Daniel, MD, 4,000 mg at 11/17/23 1254   vancomycin  (VANCOCIN ) IVPB 1000 mg/200 mL premix, 1,000 mg, Intravenous, Q M,W,F-HD, Perri DELENA Meliton Mickey., MD   vancomycin  (VANCOCIN ) IVPB 1000 mg/200 mL premix, 1,000 mg, Intravenous, Once in dialysis, Louann Pen, [facility]  Labs and Diagnostic Imaging   CBC:  Recent Labs  Lab 11/16/23 0402 11/17/23 0245  WBC 7.9 7.8  NEUTROABS  --  4.0  HGB 16.7 16.5  HCT 51.0 50.0  MCV 87.5 87.0  PLT 273 383    Basic Metabolic Panel:  Lab Results  Component Value Date   NA 133 (L) 11/17/2023   K 3.8 11/17/2023   CO2 21 (  L) 11/17/2023   GLUCOSE 163 (H) 11/17/2023   BUN 49 (H) 11/17/2023   CREATININE 11.15 (H) 11/17/2023   CALCIUM  9.8 11/17/2023   GFRNONAA 5 (L) 11/17/2023   GFRAA 3 (L) 08/14/2019   Lipid Panel:  Lab Results  Component Value Date   LDLCALC 39 11/13/2023   HgbA1c:  Lab Results  Component Value Date   HGBA1C >15.5 (H) 10/24/2023   Urine Drug Screen: No results found for: LABOPIA, COCAINSCRNUR, LABBENZ, AMPHETMU, THCU, LABBARB  Alcohol Level     Component Value Date/Time   ETH <10 02/02/2022 0152   INR  Lab Results  Component Value Date   INR 1.2 11/08/2023   APTT  Lab Results  Component Value Date   APTT 28 11/08/2023    Assessment  63 year old male with a PMHx of ESRD on dialysis TTS, chronic HFmrEF, insulin -dependent  diabetes, hypertension, PVD status post right BKA, history of stroke, hypergammaglobulinemia, hyperferritinemia, recent prior admissions for hyperglycemia with CBG > 600 in the setting of noncompliance with home insulin , followed by a subsequent admission for a fall and recurrent hyperglycemia with blood glucose of 472, QT prolongation, recent T wave inversions on EKG, who re-presented to the ED via EMS on 12/22 for severe hypotension after being called for unremitting coughing with near-syncope and SOB while at dialysis. Troponin was markedly elevated at 7689. Presentation was suspicious for NSTEMI. Cardiology was consulted and he was started on a heparin  drip in conjunction with IVF and ASA (heparin  discontinued 12/31).   - Exam at best with neuro 12/27 -- a drowsy patient who follows all simple commands and can verbalize some correct answers to orientation questions. Significantly increased latencies of  motor and verbal responses. No seizure like activity or nuchal rigidity. Significant fluctuations, fairly limited exam for me today as above (non verbal, not following commands) - Imaging: - CT head: No acute intracranial process, with chronic lacunar infarcts noted.  - MRI brain (personally reviewed): Symmetrical restricted diffusion straddling the midline within the splenium of the corpus callosum, with subtle corresponding FLAIR hyperintensity. 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 Various etiologies can lead to this appearance including seizures, metabolic disturbance (including severe hyperglycemia which this patient has had multiple times), many medications, infection, infarct (less likely given minimal FLAIR changes). No other acute intracranial abnormality. Stable atrophy and white matter disease, likely reflecting the sequela of chronic microvascular ischemia. Remote lacunar infarcts of the basal ganglia and thalami bilaterally. Left mastoid  effusion.  - EEG: 12/27 Continuous slow, generalized. This study is suggestive of mild to moderate diffuse encephalopathy.  12/30 Formal read pending - Fever curve gradually improved daily 12/24, 12/24, last fever 12/26; however Flu A positive 12/30 and now on Tamiflu   - Due to suspected NSTEMI, Cardiology was considering cardiac catheterization for evaluation of coronary anatomy and further assessment of aortic stenosis, however could not pursue as mental status not clearing / unable to consent  - DDx: - Initially presentation was felt to be most consistent with a septic encephalopathy. Moderate rather than high suspicion for meningitis due to no nuchal rigidity, but in the context of AMS, leukocytosis and fever, meningitis is certainly a consideration. Could not perform LP due to heparin  drip, which he was on for suspected NSTEMI until discontinued by cardiology on 12/31. Would recommend completing course of empiric meningitis-dose ABX including ampicillin  given his age. Also appropriately on acyclovir . At this time, CSF results would be hard to  interpret; will hold off on pursing LP for now, also based on patient's tendency to move suddenly and violently, would likely need anesthetized lumbar puncture to be able to complete the procedure safely.  - Delirium in the setting of Flu A, hospitalization, Tamiflu  and other medications may also be playing a role, noting prior PT evaluations (for example) describe an impulsive behavior and need of significant assistance for ADLs at home including inability to properly put on prosthetic  - Regarding the corpus callosum DWI lesion seen on MRI, the DDx for this is broad in the imaging literature, including PRES, toxic insults, metabolic insults and certain medications and infarct. Usually (but not always) these lesions are reversible if they are symmetric and without mass effect, as is the case with the current MRI.  Corpus callosum lesions can be associated with  significant encephalopathy as well  . RECOMMENDATIONS  - Continue dosing of meningitis-dose ampicillin , vancomycin , ceftriaxone  and acyclovir  (course typically 7 - 14 days) - Unlikely to tolerate MRI brain at this time, will repeat Head CT for now (radiology report pending, no acute process on my evaluation) - EEG read from 12/30 pending, will start LTM EEG to confirm no intermittent seizures - Depending on clinical course, may need sedation for repeat MRI brain and lumbar puncture - Neurology will follow along ______________________________________________________________________   Bonney Lola Jernigan MD-PhD [facility] [phone] Available 7 PM to 7 AM, outside of these hours please call Neurologist on call as listed on Amion.

## 2023-11-18 NOTE — Progress Notes (Signed)
 PROGRESS NOTE    [name] [name]  FMW:[id number] DOB: December 27, 1960 DOA: 11/08/2023 PCP: Pcp, No   Brief Narrative: [name] [name] is a 63 y.o. male with medical history significant of ESRD on HD, (MWF) heart failure, T2DM, sp right BKA and obesity who presented after experiencing a near syncope during hemodialysis. Recent hospitalization 12/06 to 12/10 for uncontrolled hyperglycemia. Patient was having HD on 12/22 when he was noted to have chest pain and hypotension, along with a near syncope episode. Apparently treatment was stopped and EMS was called. He wss found hypotensive and was transported to the ED. His blood pressure was initially low in the ED 75/59, but improved to 106/72. He was found to have very elevated high sensitive troponin and cardiology was consulted for evaluation.  He was diagnosed with NSTEMI and plan to admit patient to [facility] under cardiology. Pt was transferred to [facility] for further evaluation.    Assessment and Plan:  Acute metabolic encephalopathy Initial concern for encephalitis vs meningitis. Neurology consulted. LP not obtained secondary to heparin  infusion. Patient started empirically on Vancomycin / Cefepime / Flagyl , transitioned to Vancomycin  and Zosyn  as Cefepime  discontinued for fear of possibly contributing to mental status change. Zosyn  transition to Ceftriaxone , ampicillin  and Acyclovir  for CNS coverage. EEG obtained on 11/13/23 and 1/1 significant for no evidence of seizure activity. Neurology plan for LP under anesthesia.  Fever In setting of metabolic encephalopathy. Concern for CNS etiology of infection and patient treated as mentioned above. Patient also found to have an influenza infection. Fever curve has improved with resolution of fevers.  NSTEMI Cardiology consulted. Troponin elevation with peak of 8,942. Patient managed medically on Heparin  IV, aspirin  and Lipitor. Transthoracic Echocardiogram significant for an LVEF of 30-35% with  regional wall motion abnormalities, in addition to mildly reduced RV function and moderate-severe aortic stenosis.   ESRD on HD Nephrology on board for hemodialysis needs.  Acute on chronic diastolic heart failure Fluid management via hemodialysis.  Diarrhea C. Difficile negative. Improved. Possibly antibiotic related.  Diabetes mellitus type 2 Uncontrolled with hyperglycemia; last hemoglobin A1C of >15.5%. -Continue Semglee  and SSI  History of CVA -Continue ASA, and Lipitor   DVT prophylaxis: Heparin  subcutaneous Code Status:   Code Status: Full Code Family Communication: Daughter on telephone (20 minutes) Disposition Plan: Discharge pending ongoing workup/management   Consultants:    Procedures:    Antimicrobials: Vancomycin  Zosyn  Oseltaivir Flagyl  Ceftriaxone  Cefepime  Acyclovir  Ampicillin     Subjective: Patient not responsive to questions.  Objective: BP 139/81   Pulse 87   Temp 97.6 F (36.4 C)   Resp (!) 22   Ht 6' 1 (1.854 m)   Wt 102.2 kg   SpO2 92%   BMI 29.73 kg/m   Examination:  General exam: Appears calm and comfortable Respiratory system: Clear to auscultation. Respiratory effort normal. Cardiovascular system: S1 & S2 heard, RRR. No murmurs, rubs, gallops or clicks. Gastrointestinal system: Abdomen is nondistended, soft and nontender. Normal bowel sounds heard. Central nervous system: Lethargic. Grimaces to painful stimuli. Does not respond to voice. Musculoskeletal: No edema. No calf tenderness Skin: No cyanosis. No rashes Psychiatry: Judgement and insight appear normal. Mood & affect appropriate.    Data Reviewed: I have personally reviewed following labs and imaging studies  CBC Lab Results  Component Value Date   WBC 7.8 11/17/2023   RBC 5.75 11/17/2023   HGB 16.5 11/17/2023   HCT 50.0 11/17/2023   MCV 87.0 11/17/2023   MCH 28.7 11/17/2023  PLT 383 11/17/2023   MCHC 33.0 11/17/2023   RDW 14.8 11/17/2023   LYMPHSABS  1.7 11/17/2023   MONOABS 1.5 (H) 11/17/2023   EOSABS 0.3 11/17/2023   BASOSABS 0.1 11/17/2023     Last metabolic panel Lab Results  Component Value Date   NA 133 (L) 11/17/2023   K 3.8 11/17/2023   CL 91 (L) 11/17/2023   CO2 21 (L) 11/17/2023   BUN 49 (H) 11/17/2023   CREATININE 11.15 (H) 11/17/2023   GLUCOSE 163 (H) 11/17/2023   GFRNONAA 5 (L) 11/17/2023   GFRAA 3 (L) 08/14/2019   CALCIUM  9.8 11/17/2023   PHOS 5.1 (H) 11/17/2023   PROT 8.6 (H) 11/17/2023   ALBUMIN  2.9 (L) 11/17/2023   LABGLOB 5.1 (H) 12/17/2022   AGRATIO 0.7 11/20/2022   BILITOT 0.9 11/17/2023   ALKPHOS 78 11/17/2023   AST 27 11/17/2023   ALT 23 11/17/2023   ANIONGAP 21 (H) 11/17/2023    GFR: Estimated Creatinine Clearance: 8.7 mL/min (A) (by C-G formula based on SCr of 11.15 mg/dL (H)).  Recent Results (from the past 240 hours)  MRSA Next Gen by PCR, Nasal     Status: Abnormal   Collection Time: 11/09/23  4:15 PM   Specimen: Nasal Mucosa; Nasal Swab  Result Value Ref Range Status   MRSA by PCR Next Gen DETECTED (A) NOT DETECTED Final    Comment: RESULT CALLED TO, READ BACK BY AND VERIFIED WITH: C HALL,RN@2358  11/09/23 MK (NOTE) The GeneXpert MRSA Assay (FDA approved for NASAL specimens only), is one component of a comprehensive MRSA colonization surveillance program. It is not intended to diagnose MRSA infection nor to guide or monitor treatment for MRSA infections. Test performance is not FDA approved in patients less than 58 years old. Performed at [facility], [address] [place], [place] [address]   Culture, blood (Routine X 2) w Reflex to ID Panel     Status: None   Collection Time: 11/10/23  3:03 PM   Specimen: BLOOD RIGHT ARM  Result Value Ref Range Status   Specimen Description BLOOD RIGHT ARM  Final   Special Requests   Final    BOTTLES DRAWN AEROBIC AND ANAEROBIC Blood Culture results may not be optimal due to an inadequate volume of blood received in culture bottles    Culture   Final    NO GROWTH 5 DAYS Performed at [facility], [address] [place], [place] [address]    Report Status 11/15/2023 FINAL  Final  Culture, blood (Routine X 2) w Reflex to ID Panel     Status: None   Collection Time: 11/10/23  3:08 PM   Specimen: BLOOD RIGHT ARM  Result Value Ref Range Status   Specimen Description BLOOD RIGHT ARM  Final   Special Requests   Final    BOTTLES DRAWN AEROBIC ONLY Blood Culture results may not be optimal due to an inadequate volume of blood received in culture bottles   Culture   Final    NO GROWTH 5 DAYS Performed at [facility], [address] [place], [place] [address]    Report Status 11/15/2023 FINAL  Final  C Difficile Quick Screen w PCR reflex     Status: None   Collection Time: 11/12/23  8:37 AM   Specimen: STOOL  Result Value Ref Range Status   C Diff antigen NEGATIVE NEGATIVE Final   C Diff toxin NEGATIVE NEGATIVE Final   C Diff interpretation No C.  difficile detected.  Final    Comment: Performed at [facility], [address] [place], [place] [address]  Respiratory (~20 pathogens) panel by PCR     Status: Abnormal   Collection Time: 11/16/23  2:32 PM   Specimen: Nasopharyngeal Swab; Respiratory  Result Value Ref Range Status   Adenovirus NOT DETECTED NOT DETECTED Final   Coronavirus 229E NOT DETECTED NOT DETECTED Final    Comment: (NOTE) The Coronavirus on the Respiratory Panel, DOES NOT test for the novel  Coronavirus (2019 nCoV)    Coronavirus HKU1 NOT DETECTED NOT DETECTED Final   Coronavirus NL63 NOT DETECTED NOT DETECTED Final   Coronavirus OC43 NOT DETECTED NOT DETECTED Final   Metapneumovirus NOT DETECTED NOT DETECTED Final   Rhinovirus / Enterovirus NOT DETECTED NOT DETECTED Final   Influenza A H1 2009 DETECTED (A) NOT DETECTED Final   Influenza B NOT DETECTED NOT DETECTED Final   Parainfluenza Virus 1 NOT DETECTED NOT DETECTED Final   Parainfluenza Virus 2 NOT DETECTED NOT DETECTED  Final   Parainfluenza Virus 3 NOT DETECTED NOT DETECTED Final   Parainfluenza Virus 4 NOT DETECTED NOT DETECTED Final   Respiratory Syncytial Virus NOT DETECTED NOT DETECTED Final   Bordetella pertussis NOT DETECTED NOT DETECTED Final   Bordetella Parapertussis NOT DETECTED NOT DETECTED Final   Chlamydophila pneumoniae NOT DETECTED NOT DETECTED Final   Mycoplasma pneumoniae NOT DETECTED NOT DETECTED Final    Comment: Performed at [facility], [address] [place], [place] [address]  SARS Coronavirus 2 by RT PCR (hospital order, performed in [facility] hospital lab) *cepheid single result test* Anterior Nasal Swab     Status: None   Collection Time: 11/16/23  2:32 PM   Specimen: Anterior Nasal Swab  Result Value Ref Range Status   SARS Coronavirus 2 by RT PCR NEGATIVE NEGATIVE Final    Comment: Performed at [facility], [address], [place], [place] [address]      Radiology Studies: EEG adult Result Date: 11/18/2023 Shelton Arlin KIDD, MD     11/18/2023 10:17 AM Patient Name: [name] [name] Sr. MRN: [id number] Epilepsy Attending: Arlin KIDD Shelton Referring Physician/Provider: Perri DELENA Meliton Mickey., MD Date: 11/17/2023 Duration: 22.41 mins  Patient history: 63yo M with ams getting eeg to evaluate for seizure  Level of alertness: Awake/ lethargic  AEDs during EEG study: None  Technical aspects: This EEG study was done with scalp electrodes positioned according to the 10-20 International system of electrode placement. Electrical activity was reviewed with band pass filter of 1-70Hz , sensitivity of 7 uV/mm, display speed of 38mm/sec with a 60Hz  notched filter applied as appropriate. EEG data were recorded continuously and digitally stored.  Video monitoring was available and reviewed as appropriate.  Description: EEG showed continuous generalized 3-5hz  theta-delta slowing.Hyperventilation and photic stimulation were not performed.    ABNORMALITY - Continuous slow, generalized   IMPRESSION: This study is suggestive of moderate diffuse encephalopathy. No seizures or epileptiform discharges were seen throughout the recording.  Priyanka KIDD Shelton   CT HEAD WO CONTRAST ( ) Result Date: 11/18/2023 CLINICAL DATA:  Delirium EXAM: CT HEAD WITHOUT CONTRAST TECHNIQUE: Contiguous axial images were obtained from the base of the skull through the vertex without intravenous contrast. RADIATION DOSE REDUCTION: This exam was performed according to the departmental dose-optimization program which includes automated exposure control, adjustment of the mA and/or kV according to patient size and/or use of iterative reconstruction technique. COMPARISON:  Brain MRI 11/11/2023 FINDINGS:  Brain: No evidence of acute infarction, hemorrhage, hydrocephalus, extra-axial collection or mass lesion/mass effect. Chronic lacunar infarcts at the bilateral basal ganglia. Ischemic gliosis in the cerebral white matter is mild. Mild for age cerebral volume loss. Vascular: No hyperdense vessel or unexpected calcification. Skull: Normal. Negative for fracture or focal lesion. Sinuses/Orbits: No acute finding. IMPRESSION: No acute finding or change since brain MRI 11/11/2023. Electronically Signed   By: Dorn Roulette M.D.   On: 11/18/2023 09:02      LOS: 10 days    Elgin Lam, MD [facility] 11/18/2023, 3:40 PM   If 7PM-7AM, please contact night-coverage [email or web address]

## 2023-11-18 NOTE — Procedures (Signed)
 Patient Name: [name] GIANNINI Sr.  MRN: [id number]  Epilepsy Attending: Arlin MALVA Krebs  Referring Physician/Provider: Perri DELENA Meliton Mickey., MD  Date: 11/17/2023 Duration: 22.41 mins   Patient history: 63yo M with ams getting eeg to evaluate for seizure   Level of alertness: Awake/ lethargic    AEDs during EEG study: None   Technical aspects: This EEG study was done with scalp electrodes positioned according to the 10-20 International system of electrode placement. Electrical activity was reviewed with band pass filter of 1-70Hz , sensitivity of 7 uV/mm, display speed of 69mm/sec with a 60Hz  notched filter applied as appropriate. EEG data were recorded continuously and digitally stored.  Video monitoring was available and reviewed as appropriate.   Description: EEG showed continuous generalized 3-5hz  theta-delta slowing.Hyperventilation and photic stimulation were not performed.      ABNORMALITY - Continuous slow, generalized   IMPRESSION: This study is suggestive of moderate diffuse encephalopathy.  No seizures or epileptiform discharges were seen throughout the recording.   Jams Trickett O Rielly Brunn

## 2023-11-18 NOTE — Progress Notes (Signed)
 Received patient in bed to unit.  Alert and oriented.  Informed consent signed and in chart.   TX duration: 3.5 GHOURS  Patient tolerated well.  Patient completed at bedside Alert, without acute distress.  Hand-off given to patient's nurse Roselie Albe  Access used: LUE AVF Access issues: none  Total UF removed: Medication(s) given: Hectoroal Post HD VS: Temp 97.5 bp 92/61 p 83 resp 23 O2 sat 96 % RA Post HD weight: unable to obtain  Lysette Lindenbaum, RN [facility]

## 2023-11-18 NOTE — Progress Notes (Addendum)
 Pharmacy Antibiotic Note  [name] [name]. is a 63 y.o. male with ESRD on MWF HD admitted on 11/08/2023 with sepsis.  Pharmacy has been consulted for vancomycin , ceftriaxone , ampicillin , and acyclovir  dosing for possible meningitis. Last HD session on 12/28 AM, off schedule due to the holidays. Last dose of vancomycin  given at 1107 on 12/28.   Next HD scheduled today - back on schedule. Per neurology, previously unable to complete LP due to heparin  drip for NSTEMI; CSF results would be difficult to interpret at this time so deferring. Although slight concern for meningitis, still recommending completing empiric meningitis tx.  >>>> ID consulted this afternoon - recommended to d/c abx and still consider obtaining LP.   12/31 - Respiratory PCR detected influenza A, initiated on Tamiflu .   WBC wnl, patient remains afebrile.  Plan: Discontinue Vancomycin  MWF 1000 mg post HD (goal pre-HD level 15-25 mcg/ml) Discontinue Ceftriaxone  2 g IV q12h Discontinue Ampicillin  2 g IV q12h Discontinue Acyclovir  450 mg IV q24h (5 mg/kg ABW for BMI >30) Continue Tamiflu  30mg  MWF  F/U ability to obtain LP   Height: 6' 1 (185.4 cm) Weight: 102.2 kg (225 lb 5 oz) IBW/kg (Calculated) : 79.9  Temp (24hrs), Avg:97.8 F (36.6 C), Min:97.5 F (36.4 C), Max:98.6 F (37 C)  Recent Labs  Lab 11/13/23 0816 11/14/23 0332 11/14/23 1654 11/15/23 0316 11/16/23 0402 11/17/23 0245  WBC 7.7 6.4 5.2 6.5 7.9 7.8  CREATININE 7.21* 9.31*  --  7.77* 9.70* 11.15*    Estimated Creatinine Clearance: 8.7 mL/min (A) (by C-G formula based on SCr of 11.15 mg/dL (H)).    Allergies  Allergen Reactions   Sulfa Antibiotics Diarrhea and Nausea And Vomiting    Antimicrobials this admission: Vancomycin  12/24 >>  Cefepime  12/24 >> 12/25 Metronidazole  12/25 >> 12/25 Zosyn  12/25 >> 12/26 Ceftriaxone  12/26 >> Ampicillin  12/26 >> Acyclovir  12/26 >>  Dose adjustments this admission:  Microbiology results: 12/25 sputum  cx: sent  12/24 Bcx: ngtd  12/23 MRSA PCR positive   12/30 Resp PCR: Influenza A  12/30 COVID neg  Thank you for involving pharmacy in this patient's care.  Marget Hench, PharmD PGY1 Pharmacy Resident

## 2023-11-19 ENCOUNTER — Encounter (HOSPITAL_COMMUNITY)
Admission: EM | Disposition: A | Discharge status: Skilled Nursing Facility | Payer: Self-pay | Source: Home / Self Care | Attending: Internal Medicine

## 2023-11-19 DIAGNOSIS — I214 Non-ST elevation (NSTEMI) myocardial infarction: Secondary | ICD-10-CM | POA: Diagnosis not present

## 2023-11-19 DIAGNOSIS — R569 Unspecified convulsions: Secondary | ICD-10-CM | POA: Diagnosis not present

## 2023-11-19 LAB — GLUCOSE, CAPILLARY
Glucose-Capillary: 132 mg/dL — ABNORMAL HIGH (ref 70–99)
Glucose-Capillary: 173 mg/dL — ABNORMAL HIGH (ref 70–99)
Glucose-Capillary: 174 mg/dL — ABNORMAL HIGH (ref 70–99)
Glucose-Capillary: 223 mg/dL — ABNORMAL HIGH (ref 70–99)

## 2023-11-19 SURGERY — RADIOLOGY WITH ANESTHESIA
Anesthesia: General

## 2023-11-19 MED ORDER — THIAMINE MONONITRATE 100 MG PO TABS
100.0000 mg | ORAL_TABLET | Freq: Every day | ORAL | Status: DC
  Administered 2023-11-24 – 2023-12-15 (×22): 100 mg via ORAL
  Filled 2023-11-19 (×30): qty 1

## 2023-11-19 MED ORDER — INSULIN GLARGINE-YFGN 100 UNIT/ML ~~LOC~~ SOLN
6.0000 [IU] | Freq: Every day | SUBCUTANEOUS | Status: DC
Start: 2023-11-20 — End: 2023-11-24
  Administered 2023-11-20 – 2023-11-24 (×5): 6 [IU] via SUBCUTANEOUS
  Filled 2023-11-19 (×5): qty 0.06

## 2023-11-19 MED ORDER — THIAMINE HCL 100 MG/ML IJ SOLN
100.0000 mg | Freq: Every day | INTRAMUSCULAR | Status: DC
  Administered 2023-11-19 – 2023-11-23 (×4): 100 mg via INTRAVENOUS
  Filled 2023-11-19 (×5): qty 2

## 2023-11-19 NOTE — H&P (Signed)
 Chief Complaint: AMS. Request is for lumbar puncture with anesthesia.    Referring Physician(s): Dr. Salman Khaliqdina.  Supervising Physician: Alona Corners  Patient Status: [facility] - In-pt  History of Present Illness: CLAIBORNE STROBLE Sr. is a 63 y.o. male  History of ESRD on HD, DM, right sided BKA, CHF. Presented to the ED at [facility] on 12.22.24 after experiencing chest pain, SHOB, hypotension and near syncopal event during dialysis.  Found to have AMS due to encephalopathy, hyperglycemic with abnormal EKG.  Team is requesting lumbar puncture for worsening cognition.   Unable to assess ROS due to patient  status.   Patient is on ASA 81 mg last dose on 12.31.24. BUN 63, Cr 14.18, Albumin  2.8, GFR < 4. No pertinent allergy.   Return precautions and treatment recommendations and follow-up discussed with the patient 's daughter who is agreeable with the plan.    Past Medical History:  Diagnosis Date   ESRD on hemodialysis (HCC)    a. HD MWF > 10 years.   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 01/2022 Echo: EF 45-50%, glob HK, mod LVH, mildly reduced RV fxn, mild MR, mod-sev MV Ca2+. Mild AS; b. 10/2023 Echo: EF 45-50%, glob HK, GrII DD, mildly reduced RV fxn, RVSP 52.91mmHg. Mild MR/MS, mod TR.   Hypertension    Nonischemic cardiomyopathy (HCC)    a. 2019 Cath [facility]): Nonobstructive disease; b. 12/2019 Echo: EF 50%; c. 12/2019 MV (Tx w/u [facility]): Ant infarct w/ peri-infarct ischemia. EF 35%; c. 05/2021 MV (Tx w/u [facility]): Cor Ca2+, EF 53%, motion artifact, equiv study, likely low risk.   Nonobstructive coronary artery disease    a.  2019 Cath ([place]): Left main normal, LAD 30 proximal, RCA 30 distal, EF 25%.   Obesity    Type 2 diabetes mellitus (HCC)    a. 10/2023 - admission w/ HHS.    Past Surgical History:  Procedure Laterality Date   A/V FISTULAGRAM N/A 08/15/2019   Procedure: A/V FISTULAGRAM;  Surgeon: Sheree Penne Bruckner, MD;   Location: [facility] INVASIVE CV LAB;  Service: Cardiovascular;  Laterality: N/A;   AMPUTATION Right 02/02/2022   Procedure: AMPUTATION 5th RAY;  Surgeon: Joya Stabs, MD;  Location: [facility] OR;  Service: Podiatry;  Laterality: Right;   AMPUTATION Right 05/23/2022   Procedure: RIGHT BELOW KNEE AMPUTATION;  Surgeon: Harden Jerona GAILS, MD;  Location: [facility] OR;  Service: Orthopedics;  Laterality: Right;   AMPUTATION Right 07/04/2022   Procedure: REVISION AMPUTATION BELOW KNEE;  Surgeon: Harden Jerona GAILS, MD;  Location: [facility] OR;  Service: Orthopedics;  Laterality: Right;   APPLICATION OF WOUND VAC  05/23/2022   Procedure: APPLICATION OF WOUND VAC;  Surgeon: Harden Jerona GAILS, MD;  Location: [facility] OR;  Service: Orthopedics;;   AV FISTULA PLACEMENT     CARDIAC CATHETERIZATION  02/17/2018   LHC 02/17/18 ([name]): 30% pLAD, 30% dRCA, LVEDP 25-30, LVEF 25%.     CATARACT EXTRACTION W/PHACO Right 03/29/2020   Procedure: CATARACT EXTRACTION PHACO AND INTRAOCULAR LENS PLACEMENT (IOC)  RIGHT DIABETIC;  Surgeon: Mittie Gaskin, MD;  Location: [facility] ORS;  Service: Ophthalmology;  Laterality: Right;  Lot # [id number] H US : 02:30.9 AP% 9.5% CDE: 14.38   EYE SURGERY Right    cataract removed   I & D EXTREMITY Right 02/02/2022   Procedure: IRRIGATION AND DEBRIDEMENT RIGHT FOOT;  Surgeon: Joya Stabs, MD;  Location: [facility] OR;  Service: Podiatry;  Laterality: Right;   INCISION AND DRAINAGE OF WOUND  Right 02/08/2022   Procedure: IRRIGATION AND DEBRIDEMENT WOUND;  Surgeon: Burt Fus, DPM;  Location: [facility] OR;  Service: Podiatry;  Laterality: Right;  with removal of infected bone    INSERTION OF DIALYSIS CATHETER  08/13/2019   Procedure: Insertion Of Dialysis Catheter;  Surgeon: Oris Krystal FALCON, MD;  Location: [facility] OR;  Service: Vascular;;   INSERTION OF DIALYSIS CATHETER Left 07/19/2020   Procedure: INSERTION OF DIALYSIS CATHETER;  Surgeon: Sheree Penne Bruckner, MD;  Location: [facility] OR;  Service: Vascular;  Laterality: Left;   IR US   GUIDE BX ASP/DRAIN  11/07/2022   IRRIGATION AND DEBRIDEMENT ABSCESS     PERIPHERAL VASCULAR BALLOON ANGIOPLASTY  08/15/2019   Procedure: PERIPHERAL VASCULAR BALLOON ANGIOPLASTY;  Surgeon: Sheree Penne Bruckner, MD;  Location: [facility] INVASIVE CV LAB;  Service: Cardiovascular;;   PERIPHERAL VASCULAR BALLOON ANGIOPLASTY Left 02/06/2022   Procedure: PERIPHERAL VASCULAR BALLOON ANGIOPLASTY;  Surgeon: Gretta Bruckner PARAS, MD;  Location: [facility] INVASIVE CV LAB;  Service: Cardiovascular;  Laterality: Left;   REVISON OF ARTERIOVENOUS FISTULA Left 07/19/2020   Procedure: REVISON/PLICATION OF ARTERIOVENOUS FISTULA LEFT;  Surgeon: Sheree Penne Bruckner, MD;  Location: [facility] OR;  Service: Vascular;  Laterality: Left;   THROMBECTOMY AND REVISION OF ARTERIOVENTOUS (AV) GORETEX  GRAFT Left 08/13/2019   Procedure: THROMBECTOMY OF LEFT ARM ARTERIOVENTOUS (AV) FISTULA;  Surgeon: Oris Krystal FALCON, MD;  Location: [facility] OR;  Service: Vascular;  Laterality: Left;   TOE AMPUTATION      Allergies: Sulfa antibiotics  Medications: Prior to Admission medications   Medication Sig Start Date End Date Taking? Authorizing Provider  acetaminophen  (TYLENOL ) 500 MG tablet Take 1,000 mg by mouth 2 (two) times daily as needed for moderate pain (pain score 4-6), headache or fever.   Yes [provider]  B Complex-C-Folic Acid  (DIALYVITE 800) 0.8 MG TABS Take 1 tablet by mouth daily.   Yes [provider]  cinacalcet  (SENSIPAR ) 30 MG tablet Take 30 mg by mouth daily.   Yes [provider]  insulin  isophane & regular human KwikPen (NOVOLIN  70/30 KWIKPEN) (70-30) 100 UNIT/ML KwikPen Inject 30U in the morning and 25U in the evening 10/27/23  Yes Ezenduka, Nkeiruka J, MD  lidocaine -prilocaine  (EMLA ) cream Apply 1 Application topically Every Tuesday,Thursday,and Saturday with dialysis.   Yes [provider]  sevelamer  carbonate (RENVELA ) 800 MG tablet Take 2,400-4,000 mg by mouth See admin instructions. Take 5  tablets (4000mg ) three times daily with meals and 3 tablets (2400mg ) twice daily with snacks.   Yes [provider]  Insulin  Pen Needle 32G X 4 MM MISC Use with insulin  pen. 10/28/23   Donnamarie Lebron PARAS, MD     Family History  Problem Relation Age of Onset   Hypertension Mother     Social History   Socioeconomic History   Marital status: Divorced    Spouse name: Not on file   Number of children: Not on file   Years of education: Not on file   Highest education level: Not on file  Occupational History   Not on file  Tobacco Use   Smoking status: Never   Smokeless tobacco: Never  Vaping Use   Vaping status: Never Used  Substance and Sexual Activity   Alcohol use: Not Currently   Drug use: Never   Sexual activity: Not on file  Other Topics Concern   Not on file  Social History Narrative   Lives locally.  Does not routinely exercise.   Social Drivers of Corporate Investment Banker Strain:  Not on file  Food Insecurity: Patient Unable To Answer (11/10/2023)   Hunger Vital Sign    Worried About Running Out of Food in the Last Year: Patient unable to answer    Ran Out of Food in the Last Year: Patient unable to answer  Transportation Needs: Unknown (11/10/2023)   PRAPARE - Transportation    Lack of Transportation (Medical): Patient unable to answer    Lack of Transportation (Non-Medical): No  Physical Activity: Not on file  Stress: Not on file  Social Connections: Not on file    Review of Systems: A 12 point ROS discussed and pertinent positives are indicated in the HPI above.  All other systems are negative.  Review of Systems  Unable to perform ROS: Mental status change    Vital Signs: BP (!) 88/53   Pulse 92   Temp 98.8 F (37.1 C) (Axillary)   Resp 19   Ht 6' 1 (1.854 m)   Wt 225 lb 5 oz (102.2 kg)   SpO2 95%   BMI 29.73 kg/m     Physical Exam Vitals and nursing note reviewed.  Constitutional:      Appearance: He is well-developed. He  is obese.  HENT:     Head: Normocephalic.  Cardiovascular:     Comments: Hypotensive Pulmonary:     Effort: Pulmonary effort is normal.  Musculoskeletal:        General: Normal range of motion.     Cervical back: Normal range of motion.  Skin:    General: Skin is dry.  Neurological:     Comments: Unable to assess     Imaging: Overnight EEG with video Result Date: 11/19/2023 Shelton Arlin KIDD, MD     11/19/2023 12:11 PM Patient Name: JAIVYN GULLA Sr. MRN: [id number] Epilepsy Attending: Arlin KIDD Shelton Referring Physician/Provider: Jerrie Lola CROME, MD Duration: 11/18/2023 9155 to 11/19/2023 9155  Patient history: 63yo M with ams getting eeg to evaluate for seizure  Level of alertness: Awake/ lethargic  AEDs during EEG study: None  Technical aspects: This EEG study was done with scalp electrodes positioned according to the 10-20 International system of electrode placement. Electrical activity was reviewed with band pass filter of 1-70Hz , sensitivity of 7 uV/mm, display speed of 2mm/sec with a 60Hz  notched filter applied as appropriate. EEG data were recorded continuously and digitally stored.  Video monitoring was available and reviewed as appropriate.  Description: EEG showed continuous generalized 3-5hz  theta-delta slowing. Hyperventilation and photic stimulation were not performed.    ABNORMALITY - Continuous slow, generalized  IMPRESSION: This study is suggestive of moderate diffuse encephalopathy. No seizures or epileptiform discharges were seen throughout the recording.  Arlin KIDD Shelton   EEG adult Result Date: 11/18/2023 Shelton Arlin KIDD, MD     11/18/2023 10:17 AM Patient Name: TRAYVION EMBLETON Sr. MRN: [id number] Epilepsy Attending: Arlin KIDD Shelton Referring Physician/Provider: Perri DELENA Meliton Mickey., MD Date: 11/17/2023 Duration: 22.41 mins  Patient history: 63yo M with ams getting eeg to evaluate for seizure  Level of alertness: Awake/ lethargic  AEDs during EEG study: None  Technical aspects:  This EEG study was done with scalp electrodes positioned according to the 10-20 International system of electrode placement. Electrical activity was reviewed with band pass filter of 1-70Hz , sensitivity of 7 uV/mm, display speed of 89mm/sec with a 60Hz  notched filter applied as appropriate. EEG data were recorded continuously and digitally stored.  Video monitoring was available and reviewed as appropriate.  Description: EEG showed continuous generalized  3-5hz  theta-delta slowing.Hyperventilation and photic stimulation were not performed.    ABNORMALITY - Continuous slow, generalized  IMPRESSION: This study is suggestive of moderate diffuse encephalopathy. No seizures or epileptiform discharges were seen throughout the recording.  Priyanka MALVA Krebs   CT HEAD WO CONTRAST ( ) Result Date: 11/18/2023 CLINICAL DATA:  Delirium EXAM: CT HEAD WITHOUT CONTRAST TECHNIQUE: Contiguous axial images were obtained from the base of the skull through the vertex without intravenous contrast. RADIATION DOSE REDUCTION: This exam was performed according to the departmental dose-optimization program which includes automated exposure control, adjustment of the mA and/or kV according to patient size and/or use of iterative reconstruction technique. COMPARISON:  Brain MRI 11/11/2023 FINDINGS: Brain: No evidence of acute infarction, hemorrhage, hydrocephalus, extra-axial collection or mass lesion/mass effect. Chronic lacunar infarcts at the bilateral basal ganglia. Ischemic gliosis in the cerebral white matter is mild. Mild for age cerebral volume loss. Vascular: No hyperdense vessel or unexpected calcification. Skull: Normal. Negative for fracture or focal lesion. Sinuses/Orbits: No acute finding. IMPRESSION: No acute finding or change since brain MRI 11/11/2023. Electronically Signed   By: Dorn Roulette M.D.   On: 11/18/2023 09:02   DG CHEST PORT 1 VIEW Result Date: 11/16/2023 CLINICAL DATA:  Cough and congestion EXAM: PORTABLE  CHEST 1 VIEW COMPARISON:  11/10/2019 FINDINGS: Check shadow is enlarged but stable. Aortic calcifications are again seen. Lungs are hypoinflated but clear. No focal infiltrate or effusion is noted. IMPRESSION: No acute abnormality noted. Electronically Signed   By: Oneil Devonshire M.D.   On: 11/16/2023 18:19   EEG adult Result Date: 11/13/2023 Krebs Arlin MALVA, MD     11/13/2023  4:15 PM Patient Name: LOUIS IVERY Sr. MRN: [id number] Epilepsy Attending: Arlin MALVA Krebs Referring Physician/Provider: Merrianne Locus, MD Date: 11/13/2023 Duration: 22.35 mins Patient history: 63yo M with ams getting eeg to evaluate for seizure Level of alertness: Awake/ lethargic AEDs during EEG study: None Technical aspects: This EEG study was done with scalp electrodes positioned according to the 10-20 International system of electrode placement. Electrical activity was reviewed with band pass filter of 1-70Hz , sensitivity of 7 uV/mm, display speed of 26mm/sec with a 60Hz  notched filter applied as appropriate. EEG data were recorded continuously and digitally stored.  Video monitoring was available and reviewed as appropriate. Description: No clear posterior dominant rhythm was seen. EEG showed continuous generalized predominantly 5 to 7 Hz  theta- slowing admixed with intermittent 2-3Hz  delta slowing, at times with triphasic morphology. Hyperventilation and photic stimulation were not performed.   ABNORMALITY - Continuous slow, generalized IMPRESSION: This study is suggestive of mild to moderate diffuse encephalopathy. No seizures or epileptiform discharges were seen throughout the recording. Arlin MALVA Krebs   MR BRAIN WO CONTRAST Result Date: 11/11/2023 CLINICAL DATA:  Mental status change, persistent or worsening. Single episode during hemodialysis. Chest pain and hypotension. EXAM: MRI HEAD WITHOUT CONTRAST TECHNIQUE: Multiplanar, multiecho pulse sequences of the brain and surrounding structures were obtained without  intravenous contrast. COMPARISON:  CT head without contrast 11/09/2023. MR head 02/04/2022 FINDINGS: Brain: Somewhat ill-defined restricted diffusion is present within the splenium of the corpus callosum. No other restricted diffusion is present. Mild atrophy and white matter changes are otherwise stable. Remote lacunar infarcts of the basal ganglia and thalami are again noted bilaterally. The ventricles are proportionate to the degree of atrophy. No significant extraaxial fluid collection is present. The brainstem and cerebellum are within normal limits. The internal auditory canals are within normal limits. Midline structures are within normal  limits. Vascular: Flow is present in the major intracranial arteries. Skull and upper cervical spine: The craniocervical junction is normal. Upper cervical spine is within normal limits. Marrow signal is unremarkable. Sinuses/Orbits: A left mastoid effusion is present. A polyp or mucous retention cyst is present within the left maxillary sinus. The paranasal sinuses and mastoid air cells are otherwise clear. Right lens replacement is noted. Globes and orbits are otherwise within normal limits. Other: IMPRESSION: 1. Somewhat ill-defined restricted diffusion within the splenium of the corpus callosum. 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 Various etiologies can lead to this appearance including seizures, metabolic disturbance or infection. Demyelination, infarct or posterior reversible encephalopathy syndrome are considered less likely. 2. No other acute intracranial abnormality. 3. Stable atrophy and white matter disease. This likely reflects the sequela of chronic microvascular ischemia. 4. Remote lacunar infarcts of the basal ganglia and thalami bilaterally. 5. Left mastoid effusion. 6. Polyp or mucous retention cyst of the left maxillary sinus. Electronically Signed   By: Lonni Necessary M.D.   On: 11/11/2023 15:01    DG Chest Port 1 View Result Date: 11/10/2023 CLINICAL DATA:  Dyspnea.  Possible sepsis. EXAM: PORTABLE CHEST 1 VIEW COMPARISON:  11/08/2023 FINDINGS: Cardiac enlargement. Mild central vascular congestion. Shallow inspiration. No airspace disease or consolidation in the lungs. No pleural effusions. No pneumothorax. Calcification of the aorta. Degenerative changes in the spine and shoulders. IMPRESSION: Cardiac enlargement with central vascular congestion. No edema or consolidation. Electronically Signed   By: Elsie Gravely M.D.   On: 11/10/2023 19:29   ECHOCARDIOGRAM COMPLETE Result Date: 11/09/2023    ECHOCARDIOGRAM REPORT   Patient Name:   NAYSON TRAWEEK Sr. Date of Exam: 11/09/2023 Medical Rec #:  [id number]         Height:       73.0 in Accession #:    [id number]        Weight:       250.0 lb Date of Birth:  11/05/1961        BSA:          2.366 m Patient Age:    62 years          BP:           126/56 mmHg Patient Gender: M                 HR:           89 bpm. Exam Location:  [facility] Procedure: 2D Echo, Cardiac Doppler and Color Doppler Indications:    Chest Pain R07.9  History:        Patient has prior history of Echocardiogram examinations, most                 recent 10/25/2023. Cardiomyopathy and CHF, Previous Myocardial                 Infarction and CAD; Risk Factors:Hypertension and Diabetes.  Sonographer:    Aida Pizza RCS Referring Phys: [id number] CHRISTOPHER RONALD BERGE IMPRESSIONS  1. Left ventricular ejection fraction, by estimation, is 30 to 35%. The left ventricle has moderately decreased function. The left ventricle demonstrates regional wall motion abnormalities (see scoring diagram/findings for description). There is moderate concentric left ventricular hypertrophy. Left ventricular diastolic parameters are consistent with Grade III diastolic dysfunction (restrictive).  2. Right ventricular systolic function is mildly reduced. The right ventricular size is normal. There is mildly  elevated pulmonary artery systolic pressure.  The estimated right ventricular systolic pressure is 42.2 mmHg.  3. Left atrial size was moderately dilated.  4. The mitral valve is degenerative. Trivial mitral valve regurgitation. Moderate mitral annular calcification.  5. The aortic valve is tricuspid. There is moderate calcification of the aortic valve. Aortic valve regurgitation is not visualized. Moderate to severe aortic valve stenosis - suspect upper end of scale, low flow/low gradient. Aortic valve area, by VTI measures 0.98 cm. Aortic valve mean gradient measures 22.0 mmHg. Dimentionless index 0.19.  6. Aortic dilatation noted. There is borderline dilatation of the aortic root, measuring 40 mm.  7. The inferior vena cava is dilated in size with <50% respiratory variability, suggesting right atrial pressure of 15 mmHg. Comparison(s): Prior images reviewed side by side. LVEF has decreased, 30-35% range with basal inferoposterior akinesis and restrictive diastolic filling. Aortic stenosis possibly severe (low flow/low gradient). Mildly elevated RVSP. FINDINGS  Left Ventricle: Left ventricular ejection fraction, by estimation, is 30 to 35%. The left ventricle has moderately decreased function. The left ventricle demonstrates regional wall motion abnormalities. The left ventricular internal cavity size was normal in size. There is moderate concentric left ventricular hypertrophy. Left ventricular diastolic parameters are consistent with Grade III diastolic dysfunction (restrictive).  LV Wall Scoring: The inferior wall, posterior wall, and basal anterolateral segment are akinetic. The entire anterior wall, entire septum, entire apex, and mid anterolateral segment are hypokinetic. Right Ventricle: The right ventricular size is normal. No increase in right ventricular wall thickness. Right ventricular systolic function is mildly reduced. There is mildly elevated pulmonary artery systolic pressure. The tricuspid  regurgitant velocity  is 2.61 m/s, and with an assumed right atrial pressure of 15 mmHg, the estimated right ventricular systolic pressure is 42.2 mmHg. Left Atrium: Left atrial size was moderately dilated. Right Atrium: Right atrial size was normal in size. Pericardium: There is no evidence of pericardial effusion. Mitral Valve: The mitral valve is degenerative in appearance. There is mild thickening of the mitral valve leaflet(s). There is mild calcification of the mitral valve leaflet(s). Moderate mitral annular calcification. Trivial mitral valve regurgitation. Tricuspid Valve: The tricuspid valve is grossly normal. Tricuspid valve regurgitation is mild. Aortic Valve: The aortic valve is tricuspid. There is moderate calcification of the aortic valve. There is moderate aortic valve annular calcification. Aortic valve regurgitation is not visualized. Moderate to severe aortic stenosis is present. Aortic valve mean gradient measures 22.0 mmHg. Aortic valve peak gradient measures 32.0 mmHg. Aortic valve area, by VTI measures 0.98 cm. Pulmonic Valve: The pulmonic valve was grossly normal. Pulmonic valve regurgitation is trivial. Aorta: Aortic dilatation noted. There is borderline dilatation of the aortic root, measuring 40 mm. Venous: The inferior vena cava is dilated in size with less than 50% respiratory variability, suggesting right atrial pressure of 15 mmHg. IAS/Shunts: No atrial level shunt detected by color flow Doppler.  LEFT VENTRICLE PLAX 2D LVIDd:         5.80 cm      Diastology LVIDs:         4.80 cm      LV e' medial:    4.68 cm/s LV PW:         1.40 cm      LV E/e' medial:  28.4 LV IVS:        1.40 cm      LV e' lateral:   5.33 cm/s LVOT diam:     2.60 cm      LV E/e' lateral: 25.0 LV SV:  57 LV SV Index:   24 LVOT Area:     5.31 cm  LV Volumes (MOD) LV vol d, MOD A2C: 169.0 ml LV vol d, MOD A4C: 184.0 ml LV vol s, MOD A2C: 97.6 ml LV vol s, MOD A4C: 111.0 ml LV SV MOD A2C:     71.4 ml LV SV  MOD A4C:     184.0 ml LV SV MOD BP:      72.1 ml RIGHT VENTRICLE RV S prime:     6.31 cm/s TAPSE (M-mode): 1.4 cm LEFT ATRIUM              Index        RIGHT ATRIUM           Index LA diam:        5.10 cm  2.16 cm/m   RA Area:     20.40 cm LA Vol (A2C):   105.0 ml 44.38 ml/m  RA Volume:   61.40 ml  25.95 ml/m LA Vol (A4C):   101.0 ml 42.69 ml/m LA Biplane Vol: 102.0 ml 43.12 ml/m  AORTIC VALVE AV Area (Vmax):    1.04 cm AV Area (Vmean):   0.96 cm AV Area (VTI):     0.98 cm AV Vmax:           283.00 cm/s AV Vmean:          219.000 cm/s AV VTI:            0.583 m AV Peak Grad:      32.0 mmHg AV Mean Grad:      22.0 mmHg LVOT Vmax:         55.40 cm/s LVOT Vmean:        39.400 cm/s LVOT VTI:          0.108 m LVOT/AV VTI ratio: 0.19  AORTA Ao Root diam: 4.00 cm MITRAL VALVE                TRICUSPID VALVE MV Area (PHT): 6.17 cm     TR Peak grad:   27.2 mmHg MV Decel Time: 123 msec     TR Vmax:        261.00 cm/s MR Peak grad: 92.5 mmHg MR Mean grad: 54.0 mmHg     SHUNTS MR Vmax:      481.00 cm/s   Systemic VTI:  0.11 m MR Vmean:     334.0 cm/s    Systemic Diam: 2.60 cm MV E velocity: 133.00 cm/s MV A velocity: 66.90 cm/s MV E/A ratio:  1.99 Jayson Sierras MD Electronically signed by Jayson Sierras MD Signature Date/Time: 11/09/2023/1:41:56 PM    Final    CT Head Wo Contrast Result Date: 11/09/2023 CLINICAL DATA:  Provided history: Mental status change, unknown cause. EXAM: CT HEAD WITHOUT CONTRAST TECHNIQUE: Contiguous axial images were obtained from the base of the skull through the vertex without intravenous contrast. RADIATION DOSE REDUCTION: This exam was performed according to the departmental dose-optimization program which includes automated exposure control, adjustment of the mA and/or kV according to patient size and/or use of iterative reconstruction technique. COMPARISON:  Head CT 10/30/2022.  Brain MRI 02/04/2022. FINDINGS: Residual circulating contrast within the dural venous sinuses. Brain:  Known chronic lacunar infarcts within the bilateral deep gray nuclei, some of which were better appreciated on the prior brain MRI of 02/04/2022. Mild patchy and ill-defined hypoattenuation within the cerebral white matter, nonspecific but compatible with chronic small vessel ischemic disease. There is no  acute intracranial hemorrhage. No demarcated cortical infarct. No extra-axial fluid collection. No evidence of an intracranial mass. No midline shift. Vascular: No hyperdense arterial vessel. Atherosclerotic calcifications. Skull: No calvarial fracture or aggressive osseous lesion. Sinuses/Orbits: No mass or acute finding within the imaged orbits. Partially imaged mucous retention cyst within the left maxillary sinus measuring at least 1.8 cm. IMPRESSION: 1.  No evidence of an acute intracranial abnormality. 2. 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 3. Mild cerebral white matter chronic small vessel ischemic disease. 4. Partially imaged left maxillary sinus mucous retention cyst (measuring at least 1.8 cm). Electronically Signed   By: Rockey Childs D.O.   On: 11/09/2023 13:05   DG Chest 2 View Result Date: 11/08/2023 CLINICAL DATA:  Shortness of breath. EXAM: CHEST - 2 VIEW COMPARISON:  CT scan, same date. FINDINGS: The heart is enlarged but appears stable. Mild central vascular congestion without overt pulmonary edema. No definite infiltrates or effusions. The bony thorax is intact. IMPRESSION: Cardiomegaly and mild central vascular congestion. Electronically Signed   By: MYRTIS Stammer M.D.   On: 11/08/2023 18:55   CT Angio Chest PE W and/or Wo Contrast Result Date: 11/08/2023 CLINICAL DATA:  Shortness of breath at dialysis.  Hypotension. EXAM: CT ANGIOGRAPHY CHEST WITH CONTRAST TECHNIQUE: Multidetector CT imaging of the chest was performed using the standard protocol during bolus administration of intravenous contrast. Multiplanar CT image reconstructions and MIPs were obtained  to evaluate the vascular anatomy. RADIATION DOSE REDUCTION: This exam was performed according to the departmental dose-optimization program which includes automated exposure control, adjustment of the mA and/or kV according to patient size and/or use of iterative reconstruction technique. CONTRAST:  75mL OMNIPAQUE  IOHEXOL  350 MG/ML SOLN COMPARISON:  CT scan 10/19/2022 FINDINGS: Cardiovascular: The heart is enlarged but appears stable. No pericardial effusion. Significant age advanced atherosclerotic calcification involving the aorta and coronary arteries. No aortic dissection or aneurysm. The pulmonary arterial tree is fairly well opacified. No filling defects to suggest pulmonary embolism. There are extensive right-sided chest wall collaterals. 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 Mediastinum/Nodes: No mediastinal or hilar mass or lymphadenopathy. Scattered lymph nodes are stable. The esophagus is grossly normal. Lungs/Pleura: Mild dependent edema/atelectasis but no infiltrates or effusions. No worrisome pulmonary lesions or pulmonary nodules. The central tracheobronchial tree is unremarkable. Upper Abdomen: Significant age advanced vascular disease. No aneurysm or dissection. No upper abdominal mass or adenopathy. Musculoskeletal: Remote ununited sternal fracture. No acute bony findings. Review of the MIP images confirms the above findings. IMPRESSION: 1. No CT findings for pulmonary embolism. 2. Significant age advanced vascular disease. 3. No acute pulmonary findings or worrisome pulmonary lesions. 4. 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 5. Remote ununited sternal fracture. 6. Aortic atherosclerosis. Aortic Atherosclerosis (ICD10-I70.0). Electronically Signed   By: MYRTIS Stammer M.D.   On: 11/08/2023 18:55   ECHOCARDIOGRAM COMPLETE Result Date: 10/25/2023    ECHOCARDIOGRAM REPORT   Patient Name:   KASTIEL SIMONIAN Sr. Date of Exam: 10/25/2023  Medical Rec #:  [id number]         Height:       73.0 in Accession #:    [id number]        Weight:       240.0 lb Date of Birth:  1961/01/02        BSA:          2.325 m Patient Age:    88 years  BP:           123/73 mmHg Patient Gender: M                 HR:           60 bpm. Exam Location:  Inpatient Procedure: 2D Echo, Cardiac Doppler and Color Doppler Indications:    Elevated Troponin  History:        Patient has prior history of Echocardiogram examinations, most                 recent 02/05/2022. Cardiomyopathy and CHF, Previous Myocardial                 Infarction; Risk Factors:Hypertension and Diabetes.  Sonographer:    Jayson Gaskins Referring Phys: [phone] JESSICA U VANN IMPRESSIONS  1. Left ventricular ejection fraction, by estimation, is 45 to 50%. The left ventricle has mildly decreased function. The left ventricle demonstrates global hypokinesis. There is mild concentric left ventricular hypertrophy. Left ventricular diastolic parameters are consistent with Grade II diastolic dysfunction (pseudonormalization).  2. RV is not well visualized, function looks at least mildly reduced. The right ventricular size is not well visualized. There is moderately elevated pulmonary artery systolic pressure. The estimated right ventricular systolic pressure is 52.2 mmHg.  3. The mitral valve is degenerative. Mild mitral valve regurgitation. Mild mitral stenosis. The mean mitral valve gradient is 5.0 mmHg. Severe mitral annular calcification.  4. Tricuspid valve regurgitation is moderate.  5. The aortic valve is calcified. Aortic valve regurgitation is not visualized. Mild to moderate aortic valve stenosis.  6. The inferior vena cava is dilated in size with <50% respiratory variability, suggesting right atrial pressure of 15 mmHg. Conclusion(s)/Recommendation(s): Increased pulmonary HTN compared to prior study can be VO. FINDINGS  Left Ventricle: Left ventricular ejection fraction, by estimation, is 45 to 50%. The  left ventricle has mildly decreased function. The left ventricle demonstrates global hypokinesis. The left ventricular internal cavity size was normal in size. There is  mild concentric left ventricular hypertrophy. Left ventricular diastolic parameters are consistent with Grade II diastolic dysfunction (pseudonormalization). Right Ventricle: RV is not well visualized, function looks at least mildly reduced. The right ventricular size is not well visualized. There is moderately elevated pulmonary artery systolic pressure. The tricuspid regurgitant velocity is 3.05 m/s, and with an assumed right atrial pressure of 15 mmHg, the estimated right ventricular systolic pressure is 52.2 mmHg. Left [facility]: Left atrial size was normal in size. Right [facility]: Right atrial size was normal in size. Pericardium: There is no evidence of pericardial effusion. Mitral Valve: The mitral valve is degenerative in appearance. Severe mitral annular calcification. Mild mitral valve regurgitation. Mild mitral valve stenosis. MV peak gradient, 7.6 mmHg. The mean mitral valve gradient is 5.0 mmHg. Tricuspid Valve: Tricuspid valve regurgitation is moderate. Aortic Valve: The aortic valve is calcified. Aortic valve regurgitation is not visualized. Mild to moderate aortic stenosis is present. Aortic valve mean gradient measures 18.0 mmHg. Aortic valve peak gradient measures 31.4 mmHg. Aortic valve area, by VTI measures 0.81 cm. Pulmonic Valve: Pulmonic valve regurgitation is not visualized. Aorta: Aortic root could not be assessed. Venous: The inferior vena cava is dilated in size with less than 50% respiratory variability, suggesting right atrial pressure of 15 mmHg. IAS/Shunts: The interatrial septum was not well visualized.  LEFT VENTRICLE PLAX 2D LVIDd:         5.80 cm   Diastology LVIDs:         4.10  cm   LV e' medial:    4.13 cm/s LV PW:         1.10 cm   LV E/e' medial:  33.4 LV IVS:        1.20 cm   LV e' lateral:   5.55 cm/s LVOT diam:      2.00 cm   LV E/e' lateral: 24.9 LV SV:         44 LV SV Index:   19 LVOT Area:     3.14 cm  RIGHT VENTRICLE RV S prime:     5.11 cm/s TAPSE (M-mode): 0.9 cm LEFT ATRIUM             Index        RIGHT ATRIUM           Index LA Vol (A2C):   63.2 ml 27.18 ml/m  RA Area:     14.20 cm LA Vol (A4C):   58.4 ml 25.12 ml/m  RA Volume:   30.10 ml  12.95 ml/m LA Biplane Vol: 64.2 ml 27.61 ml/m  AORTIC VALVE AV Area (Vmax):    0.85 cm AV Area (Vmean):   0.80 cm AV Area (VTI):     0.81 cm AV Vmax:           280.00 cm/s AV Vmean:          198.000 cm/s AV VTI:            0.550 m AV Peak Grad:      31.4 mmHg AV Mean Grad:      18.0 mmHg LVOT Vmax:         75.90 cm/s LVOT Vmean:        50.200 cm/s LVOT VTI:          0.141 m LVOT/AV VTI ratio: 0.26 MITRAL VALVE                TRICUSPID VALVE MV Area (PHT): 4.17 cm     TR Peak grad:   37.2 mmHg MV Area VTI:   1.38 cm     TR Vmax:        305.00 cm/s MV Peak grad:  7.6 mmHg MV Mean grad:  5.0 mmHg     SHUNTS MV Vmax:       1.38 m/s     Systemic VTI:  0.14 m MV Vmean:      104.0 cm/s   Systemic Diam: 2.00 cm MV Decel Time: 182 msec MV E velocity: 138.00 cm/s MV A velocity: 78.30 cm/s MV E/A ratio:  1.76 [place] signed by Ronal Ross Signature Date/Time: 10/25/2023/2:42:48 PM    Final    DG Knee Complete 4 Views Left Result Date: 10/24/2023 CLINICAL DATA:  Fall.  No left knee pain. EXAM: LEFT KNEE - COMPLETE 4+ VIEW COMPARISON:  None Available. FINDINGS: No evidence of fracture, dislocation, or joint effusion. No evidence of arthropathy or other focal bone abnormality. Soft tissues are unremarkable. IMPRESSION: Negative. Electronically Signed   By: Camellia Candle M.D.   On: 10/24/2023 07:53   CT Renal Stone Study Result Date: 10/24/2023 CLINICAL DATA:  Abdominal pain EXAM: CT ABDOMEN AND PELVIS WITHOUT CONTRAST TECHNIQUE: Multidetector CT imaging of the abdomen and pelvis was performed following the standard protocol without IV contrast. RADIATION DOSE  REDUCTION: This exam was performed according to the departmental dose-optimization program which includes automated exposure control, adjustment of the mA and/or kV according to patient size and/or use of iterative reconstruction  technique. COMPARISON:  MRI abdomen dated 10/30/2022 FINDINGS: Lower chest: Mild left basilar scarring/atelectasis. Hepatobiliary: Unenhanced liver is grossly unremarkable, noting a mildly nodular hepatic contour. Cholelithiasis, without associated inflammatory changes. No intrahepatic or extrahepatic duct dilatation. Pancreas: Within normal limits. Spleen: Within normal limits. Adrenals/Urinary Tract: Adrenal glands are within normal limits. Bilateral renal cortical scarring/atrophy. Bilateral renal cysts, measuring up to 18 mm in the posterior right lower kidney (series 3/image 37), benign (Bosniak I). No follow-up is recommended. Bilateral renal vascular calcifications. No renal calculi or hydronephrosis. Bladder is underdistended but unremarkable. Stomach/Bowel: Stomach is within normal limits. No evidence of bowel obstruction. Normal appendix (series 3/image 62). No colonic wall thickening or inflammatory changes. Vascular/Lymphatic: No evidence of abdominal aortic aneurysm. Extensive atherosclerotic calcifications of the abdominal aorta and branch vessels. No suspicious abdominopelvic lymphadenopathy. Reproductive: Prostate is unremarkable. Other: No abdominopelvic ascites. Musculoskeletal: Mild degenerative changes at L4-5. IMPRESSION: No CT findings to account for the patient's abdominal pain. Cholelithiasis, without associated inflammatory changes. Additional ancillary findings as above. Electronically Signed   By: Pinkie Pebbles M.D.   On: 10/24/2023 01:10   DG Pelvis 1-2 Views Result Date: 10/23/2023 CLINICAL DATA:  Fall with left hip pain EXAM: PELVIS - 1-2 VIEW COMPARISON:  CT 08/05/2019 FINDINGS: SI joints are non widened. Extensive vascular calcifications. No fracture  or malalignment. Pubic symphysis and rami appear intact. Mild bilateral hip degenerative change IMPRESSION: No acute osseous abnormality. Electronically Signed   By: Luke Bun M.D.   On: 10/23/2023 19:17   DG Femur Min 2 Views Left Result Date: 10/23/2023 CLINICAL DATA:  Fall with left hip pain EXAM: LEFT FEMUR 2 VIEWS COMPARISON:  None Available. FINDINGS: There is no evidence of fracture or other focal bone lesions. Soft tissues are unremarkable. Extensive vascular calcifications. IMPRESSION: Negative. Electronically Signed   By: Luke Bun M.D.   On: 10/23/2023 19:16    Labs:  CBC: Recent Labs    11/15/23 0316 11/16/23 0402 11/17/23 0245 11/18/23 1515  WBC 6.5 7.9 7.8 7.4  HGB 15.7 16.7 16.5 15.8  HCT 48.1 51.0 50.0 48.1  PLT 212 273 383 454*    COAGS: Recent Labs    11/20/22 1144 11/08/23 1830  INR 1.5* 1.2  APTT  --  28    BMP: Recent Labs    11/15/23 0316 11/16/23 0402 11/17/23 0245 11/18/23 1515  NA 130* 132* 133* 136  K 3.6 3.9 3.8 4.3  CL 90* 94* 91* 95*  CO2 22 17* 21* 21*  GLUCOSE 184* 146* 163* 158*  BUN 28* 41* 49* 63*  CALCIUM  9.7 9.8 9.8 9.9  CREATININE 7.77* 9.70* 11.15* 14.18*  GFRNONAA 7* 6* 5* 4*    LIVER FUNCTION TESTS: Recent Labs    10/23/23 1742 10/25/23 1500 11/08/23 1658 11/10/23 0359 11/10/23 1503 11/11/23 0407 11/15/23 0316 11/16/23 0402 11/17/23 0245 11/18/23 1515  BILITOT 0.6  --  0.6  --  1.0  --   --   --  0.9  --   AST 29  --  44*  --  117*  --   --   --  27  --   ALT 29  --  34  --  67*  --   --   --  23  --   ALKPHOS 152*  --  127*  --  135*  --   --   --  78  --   PROT 8.2*  --  8.3*  --  9.3*  --   --   --  8.6*  --   ALBUMIN  3.1*   < > 3.3*   < > 3.5   < > 3.1* 2.9* 2.9* 2.8*   < > = values in this interval not displayed.      Assessment and Plan:  History of ESRD on HD, DM, right sided BKA, CHF. Presented to the ED at [facility] on 12.22.24 after experiencing chest pain, SHOB, hypotension and near syncopal  event during dialysis.  Found to have AMS due to encephalopathy, hyperglycemic with abnormal EKG.  Team is requesting lumbar puncture for worsening cognition.   PLAN: IR LP with general anesthesia.  Risks and benefits of lumbar puncture was discussed with the patient's daughter including, but not limited to bleeding, infection, damage to adjacent structures or low yield requiring additional tests.  All of the questions were answered and there is agreement to proceed.  Consent signed and in chart.   Thank you for this interesting consult.  I greatly enjoyed meeting FENDER HERDER Sr. and look forward to participating in their care.  A copy of this report was sent to the requesting provider on this date.  Electronically Signed: Delon JAYSON Beagle, NP 11/20/2023, 7:47 AM   I spent a total of 40 Minutes    in face to face in clinical consultation, greater than 50% of which was counseling/coordinating care for LP with general anesthesia

## 2023-11-19 NOTE — Procedures (Addendum)
 Patient Name: [name] EVERAGE Sr.  MRN: [id number]  Epilepsy Attending: Arlin MALVA Krebs  Referring Physician/Provider: Jerrie Lola CROME, MD  Duration: 11/18/2023 9155 to 11/19/2023 9155   Patient history: 63yo M with ams getting eeg to evaluate for seizure   Level of alertness: Awake/ lethargic    AEDs during EEG study: None   Technical aspects: This EEG study was done with scalp electrodes positioned according to the 10-20 International system of electrode placement. Electrical activity was reviewed with band pass filter of 1-70Hz , sensitivity of 7 uV/mm, display speed of 18mm/sec with a 60Hz  notched filter applied as appropriate. EEG data were recorded continuously and digitally stored.  Video monitoring was available and reviewed as appropriate.   Description: EEG showed continuous generalized 3-5hz  theta-delta slowing. Hyperventilation and photic stimulation were not performed.       ABNORMALITY - Continuous slow, generalized   IMPRESSION: This study is suggestive of moderate diffuse encephalopathy. No seizures or epileptiform discharges were seen throughout the recording.   Annet Manukyan O Tiara Bartoli

## 2023-11-19 NOTE — Progress Notes (Signed)
 Patient ID: [name] [name], male   DOB: 20-Jun-1961, 63 y.o.   MRN: [id number] S: Remains unresponsive.  Appreciate ID input.  Off of antibiotics and isolation.  To have LP.  Had HD last night. O:BP (!) 102/56 (BP Location: Right Arm)   Pulse 89   Temp 98.8 F (37.1 C) (Axillary)   Resp 20   Ht 6' 1 (1.854 m)   Wt 102.2 kg   SpO2 90%   BMI 29.73 kg/m   Intake/Output Summary (Last 24 hours) at 11/19/2023 0945 Last data filed at 11/18/2023 1908 Gross per 24 hour  Intake 0 ml  Output 1900 ml  Net -1900 ml   Intake/Output: I/O last 3 completed shifts: In: 200 [IV Piggyback:200] Out: 1900 [Other:1900]  Intake/Output this shift:  No intake/output data recorded. Weight change: 0 kg Gen: unresponsive CVS: RRR Resp: CTA Abd: +BS, soft, NT/ND Ext: no edema, s/p RBKA, L AVF +T/B  Recent Labs  Lab 11/13/23 0816 11/14/23 0332 11/15/23 0316 11/16/23 0402 11/17/23 0245 11/18/23 1515  NA 130* 129* 130* 132* 133* 136  K 3.9 4.1 3.6 3.9 3.8 4.3  CL 91* 91* 90* 94* 91* 95*  CO2 23 20* 22 17* 21* 21*  GLUCOSE 258* 277* 184* 146* 163* 158*  BUN 30* 40* 28* 41* 49* 63*  CREATININE 7.21* 9.31* 7.77* 9.70* 11.15* 14.18*  ALBUMIN  2.9* 2.7* 3.1* 2.9* 2.9* 2.8*  CALCIUM  9.2 9.5 9.7 9.8 9.8 9.9  PHOS 4.0 4.8* 4.1 4.6 5.1* 6.1*  AST  --   --   --   --  27  --   ALT  --   --   --   --  23  --    Liver Function Tests: Recent Labs  Lab 11/16/23 0402 11/17/23 0245 11/18/23 1515  AST  --  27  --   ALT  --  23  --   ALKPHOS  --  78  --   BILITOT  --  0.9  --   PROT  --  8.6*  --   ALBUMIN  2.9* 2.9* 2.8*   No results for input(s): LIPASE, AMYLASE in the last 168 hours. Recent Labs  Lab 11/16/23 1518  AMMONIA 36*   CBC: Recent Labs  Lab 11/14/23 1654 11/15/23 0316 11/16/23 0402 11/17/23 0245 11/18/23 1515  WBC 5.2 6.5 7.9 7.8 7.4  NEUTROABS  --   --   --  4.0 3.9  HGB 16.0 15.7 16.7 16.5 15.8  HCT 49.9 48.1 51.0 50.0 48.1  MCV 88.9 87.5 87.5 87.0 86.7  PLT 167 212  273 383 454*   Cardiac Enzymes: No results for input(s): CKTOTAL, CKMB, CKMBINDEX, TROPONINI in the last 168 hours. CBG: Recent Labs  Lab 11/18/23 0645 11/18/23 1206 11/18/23 1844 11/18/23 2032 11/19/23 0548  GLUCAP 184* 160* 139* 93 132*    Iron  Studies: No results for input(s): IRON , TIBC, TRANSFERRIN, FERRITIN in the last 72 hours. Studies/Results: EEG adult Result Date: 11/18/2023 [name] Arlin KIDD, MD     11/18/2023 10:17 AM Patient Name: AMEAR STROJNY Sr. MRN: [id number] Epilepsy Attending: Arlin [name] [name] Referring Physician/Provider: Perri DELENA Meliton Mickey., MD Date: 11/17/2023 Duration: 22.41 mins  Patient history: 63yo M with ams getting eeg to evaluate for seizure  Level of alertness: Awake/ lethargic  AEDs during EEG study: None  Technical aspects: This EEG study was done with scalp electrodes positioned according to the 10-20 International system of electrode placement. Electrical activity was reviewed with band pass filter  of 1-70Hz , sensitivity of 7 uV/mm, display speed of 13mm/sec with a 60Hz  notched filter applied as appropriate. EEG data were recorded continuously and digitally stored.  Video monitoring was available and reviewed as appropriate.  Description: EEG showed continuous generalized 3-5hz  theta-delta slowing.Hyperventilation and photic stimulation were not performed.    ABNORMALITY - Continuous slow, generalized  IMPRESSION: This study is suggestive of moderate diffuse encephalopathy. No seizures or epileptiform discharges were seen throughout the recording.  [name] [name]   CT HEAD WO CONTRAST ( ) Result Date: 11/18/2023 CLINICAL DATA:  Delirium EXAM: CT HEAD WITHOUT CONTRAST TECHNIQUE: Contiguous axial images were obtained from the base of the skull through the vertex without intravenous contrast. RADIATION DOSE REDUCTION: This exam was performed according to the departmental dose-optimization program which includes automated exposure control,  adjustment of the mA and/or kV according to patient size and/or use of iterative reconstruction technique. COMPARISON:  Brain MRI 11/11/2023 FINDINGS: Brain: No evidence of acute infarction, hemorrhage, hydrocephalus, extra-axial collection or mass lesion/mass effect. Chronic lacunar infarcts at the bilateral basal ganglia. Ischemic gliosis in the cerebral white matter is mild. Mild for age cerebral volume loss. Vascular: No hyperdense vessel or unexpected calcification. Skull: Normal. Negative for fracture or focal lesion. Sinuses/Orbits: No acute finding. IMPRESSION: No acute finding or change since brain MRI 11/11/2023. Electronically Signed   By: [name] [name] M.D.   On: 11/18/2023 09:02    aspirin   81 mg Oral Daily   atorvastatin   40 mg Oral Daily   cinacalcet   30 mg Oral Q breakfast   doxercalciferol   3 mcg Intravenous Q M,W,F-HD   heparin  injection (subcutaneous)  5,000 Units Subcutaneous Q8H   insulin  aspart  0-5 Units Subcutaneous QHS   insulin  aspart  0-9 Units Subcutaneous TID WC   insulin  glargine-yfgn  12 Units Subcutaneous Daily   midodrine   10 mg Oral TID WC   oseltamivir   30 mg Oral Q M,W,F-HD   sevelamer  carbonate  4,000 mg Oral TID WC    BMET    Component Value Date/Time   NA 136 11/18/2023 1515   K 4.3 11/18/2023 1515   CL 95 (L) 11/18/2023 1515   CO2 21 (L) 11/18/2023 1515   GLUCOSE 158 (H) 11/18/2023 1515   BUN 63 (H) 11/18/2023 1515   CREATININE 14.18 (H) 11/18/2023 1515   CALCIUM  9.9 11/18/2023 1515   GFRNONAA 4 (L) 11/18/2023 1515   GFRAA 3 (L) 08/14/2019 2051   CBC    Component Value Date/Time   WBC 7.4 11/18/2023 1515   RBC 5.55 11/18/2023 1515   HGB 15.8 11/18/2023 1515   HGB 13.0 12/17/2022 1242   HCT 48.1 11/18/2023 1515   PLT 454 (H) 11/18/2023 1515   PLT 272 12/17/2022 1242   MCV 86.7 11/18/2023 1515   MCH 28.5 11/18/2023 1515   MCHC 32.8 11/18/2023 1515   RDW 14.8 11/18/2023 1515   LYMPHSABS 1.3 11/18/2023 1515   MONOABS 1.7 (H) 11/18/2023  1515   EOSABS 0.3 11/18/2023 1515   BASOSABS 0.1 11/18/2023 1515    Outpt Dialysis Orders:  Center: [facility] 4 hr 15 min 180NRe 450/700 105 kg 2.0 K/ 2.0 Ca UFP 4 AVF - Heparin  7000 units IV Initial bolus Heparin  2000  units IV mid run - Hectorol  7 mcg IV three times per week   Assessment/Plan: 1.AMS: Was on cefepime  which has been stopped. Per primary team some concern for encephalitis/meningitis, was on empiric antibiotics/antivirals until seen by ID yesterday and all was stopped.  Plan for LP now that he is off of heparin  drip. 2.  ESRD - Typically on MWF schedule. Had serial HD last week 12/24-12/26, and HD last night.  Will plan for HD tomorrow to get back on his regular schedule.  3.NSTEMI-work up per cardiology/primary.  4.Hyperkalemia- Resolved with HD.  5.Fever/Hypotension- BC remain no growth so far. Has been started on board spectrum antibiotics/antivirals but now discontinued yesterday.  ID following. 6. HFrEF/history of severe AS-attempt to optimize volume with dialysis. ECHO  EF 30 to 35%. Cards following. Fluid restrictions and daily weights. On midodrine  10 mg 3 times daily 7. Anemia  - Hgb above goal, no ESA indicated at this time.  8. Metabolic bone disease -Phos at goal. Continue renvela . VDRA on hold d/t high calcium .  Fairy RONAL Sellar, MD [facility] [phone]

## 2023-11-19 NOTE — Progress Notes (Signed)
 EEG D/C'd. No skin breakdown noted. [facility] notified.

## 2023-11-19 NOTE — Progress Notes (Signed)
 NEUROLOGY CONSULT FOLLOW UP NOTE   Date of service: November 19, 2023 Patient Name: [name] GARRINGER Sr. MRN:  [id number] DOB:  03/27/1961  Brief Review of HPI  63 year old male with a PMHx of ESRD on dialysis TTS, chronic HFmrEF, insulin -dependent diabetes, hypertension, PVD status post right BKA, history of stroke, hypergammaglobulinemia, hyperferritinemia, recent prior admissions for hyperglycemia with CBG > 600 in the setting of noncompliance with home insulin , followed by a subsequent admission for a fall and recurrent hyperglycemia with blood glucose of 472, QT prolongation, recent T wave inversions on EKG, who re-presented to the ED via EMS on 12/22 for severe hypotension after being called for unremitting coughing with near-syncope and SOB while at dialysis. Troponin was markedly elevated at 7689. Presentation was suspicious for NSTEMI. Cardiology was consulted and he was started on a heparin  drip in conjunction with IVF and ASA.   CT head showed no acute intracranial process, with chronic lacunar infarcts noted.    Overnight on 12/23 he became groggy and somnolent. Follow-up echocardiogram showed reduction in LVEF, now 30 to 35% range with global hypokinesis and basal inferoposterior akinesis. Aortic stenosis was potentially severe. Cardiology is considering cardiac catheterization for evaluation of coronary anatomy and further assessment of aortic stenosis, presuming mental status clears and he is able to consent.   On Wednesday (12/25) he was noted to be significantly worse in terms of cognition. After receiving dialysis overnight he was somewhat improved on Thursday's neurology assessment, being able to open eyes and answer with one-word replies, but was still somnolent.    Was improving on last neurology eval (12/27 remaining awake during the assessment without requiring constant stimulation, following simple commands) but mental status has continued to wax and wane, remaining largely  nonverbal, partially verbal but confused at best, limiting care due such as cardiac craterization as mental status would need to improve to pursue the same.   Interval Hx/subjective   Continues to be encephalopathic. LTM EEG negative so far.  Vitals   Vitals:   11/19/23 0100 11/19/23 0427 11/19/23 0612 11/19/23 0735  BP:  (!) 93/52  (!) 102/56  Pulse:  84 89 89  Resp: 15 (!) 22 19 20   Temp:  99 F (37.2 C)  98.8 F (37.1 C)  TempSrc:  Axillary  Axillary  SpO2:  92% 91% 90%  Weight:   102.2 kg   Height:         Body mass index is 29.73 kg/m.  Physical Exam   Physical Exam  HEENT: Normocephalic Lungs: Respirations unlabored Extremities: Warm and well perfused. Right BKA.    Neurological Examination Mental Status: Sleeping.  Opens eyes briefly to voice.  Nonverbal, but does make some high-pitched uncomfortable sounds intermittently. Cranial Nerves: II: PERRL.  III,IV, VI: Eyes are conjugate.  Intermittent downgaze.  Intermittently looks bilaterally.  Intermittent Bell's phenomenon as he tries to close his eyes.  VII: Grimace is symmetric VIII: Not clearly responding to examiner's voice IX,X: Gag reflex deferred XI: Head is midline Motor/sensory: Significant paratonia.  Grossly moving all 4 extremities equally responsive to noxious stim Has right BKA.  Sensory: Intact to gross touch x 4 Cerebellar: Unable to test today.  On exam 12/27 optic ataxia manifesting as difficulty moving his finger to target bilaterally Gait: Deferred  Medications  Current Facility-Administered Medications:    acetaminophen  (TYLENOL ) tablet 650 mg, 650 mg, Oral, Q6H PRN, 650 mg at 11/13/23 0411 **OR** acetaminophen  (TYLENOL ) suppository 650 mg, 650 mg, Rectal, Q6H PRN, Arrien,  Elidia Sieving, MD, 650 mg at 11/10/23 2045   aspirin  chewable tablet 81 mg, 81 mg, Oral, Daily, Arrien, Elidia Sieving, MD, 81 mg at 11/17/23 9048   atorvastatin  (LIPITOR) tablet 40 mg, 40 mg, Oral, Daily, Arrien,  Mauricio Daniel, MD, 40 mg at 11/17/23 9047   cinacalcet  (SENSIPAR ) tablet 30 mg, 30 mg, Oral, Q breakfast, Arrien, Mauricio Daniel, MD, 30 mg at 11/17/23 9047   doxercalciferol  (HECTOROL ) injection 3 mcg, 3 mcg, Intravenous, Q M,W,F-HD, Skip Lenis, PA-C, 3 mcg at 11/18/23 1556   heparin  injection 1,000 Units, 1,000 Units, Intracatheter, PRN, Delores Ricka Maidens, NP   heparin  injection 5,000 Units, 5,000 Units, Subcutaneous, Q8H, Acharya, Gayatri A, MD, 5,000 Units at 11/19/23 9361   insulin  aspart (novoLOG ) injection 0-5 Units, 0-5 Units, Subcutaneous, QHS, Bryn Bernardino NOVAK, MD   insulin  aspart (novoLOG ) injection 0-9 Units, 0-9 Units, Subcutaneous, TID WC, Bryn Bernardino NOVAK, MD, 1 Units at 11/19/23 9362   insulin  glargine-yfgn (SEMGLEE ) injection 12 Units, 12 Units, Subcutaneous, Daily, Sira, Zackery, MD, 12 Units at 11/18/23 1040   lidocaine  (PF) (XYLOCAINE ) 1 % injection 5 mL, 5 mL, Intradermal, PRN, Delores Ricka Maidens, NP   lidocaine -prilocaine  (EMLA ) cream 1 Application, 1 Application, Topical, PRN, Delores Ricka Maidens, NP   midodrine  (PROAMATINE ) tablet 10 mg, 10 mg, Oral, TID WC, Delores Ricka Maidens, NP, 10 mg at 11/17/23 1749   ondansetron  (ZOFRAN ) tablet 4 mg, 4 mg, Oral, Q6H PRN **OR** ondansetron  (ZOFRAN ) injection 4 mg, 4 mg, Intravenous, Q6H PRN, Arrien, Elidia Sieving, MD   Oral care mouth rinse, 15 mL, Mouth Rinse, PRN, Arrien, Elidia Sieving, MD   Oral care mouth rinse, 15 mL, Mouth Rinse, PRN, Sira, Zackery, MD   Oral care mouth rinse, 15 mL, Mouth Rinse, PRN, Sira, Zackery, MD   oseltamivir  (TAMIFLU ) capsule 30 mg, 30 mg, Oral, Q M,W,F-HD, Utomwen, Adesuwa, RPH   pentafluoroprop-tetrafluoroeth (GEBAUERS) aerosol 1 Application, 1 Application, Topical, PRN, Delores Ricka Maidens, NP   sevelamer  carbonate (RENVELA ) tablet 2,400 mg, 2,400 mg, Oral, BID PRN, Arrien, Elidia Sieving, MD   sevelamer  carbonate (RENVELA ) tablet 4,000 mg, 4,000 mg, Oral, TID WC, Arrien, Elidia Sieving,  MD, 4,000 mg at 11/17/23 1254  Labs and Diagnostic Imaging   CBC:  Recent Labs  Lab 11/17/23 0245 11/18/23 1515  WBC 7.8 7.4  NEUTROABS 4.0 3.9  HGB 16.5 15.8  HCT 50.0 48.1  MCV 87.0 86.7  PLT 383 454*    Basic Metabolic Panel:  Lab Results  Component Value Date   NA 136 11/18/2023   K 4.3 11/18/2023   CO2 21 (L) 11/18/2023   GLUCOSE 158 (H) 11/18/2023   BUN 63 (H) 11/18/2023   CREATININE 14.18 (H) 11/18/2023   CALCIUM  9.9 11/18/2023   GFRNONAA 4 (L) 11/18/2023   GFRAA 3 (L) 08/14/2019   Lipid Panel:  Lab Results  Component Value Date   LDLCALC 39 11/13/2023   HgbA1c:  Lab Results  Component Value Date   HGBA1C >15.5 (H) 10/24/2023   Urine Drug Screen: No results found for: LABOPIA, COCAINSCRNUR, LABBENZ, AMPHETMU, THCU, LABBARB  Alcohol Level     Component Value Date/Time   ETH <10 02/02/2022 0152   INR  Lab Results  Component Value Date   INR 1.2 11/08/2023   APTT  Lab Results  Component Value Date   APTT 28 11/08/2023    Assessment  63 year old male with a PMHx of ESRD on dialysis TTS, chronic HFmrEF, insulin -dependent diabetes, hypertension, PVD status post right BKA,  history of stroke, hypergammaglobulinemia, hyperferritinemia, recent prior admissions for hyperglycemia with CBG > 600 in the setting of noncompliance with home insulin , followed by a subsequent admission for a fall and recurrent hyperglycemia with blood glucose of 472, QT prolongation, recent T wave inversions on EKG, who re-presented to the ED via EMS on 12/22 for severe hypotension after being called for unremitting coughing with near-syncope and SOB while at dialysis. Troponin was markedly elevated at 7689. Presentation was suspicious for NSTEMI. Cardiology was consulted and he was started on a heparin  drip in conjunction with IVF and ASA (heparin  discontinued 12/31).   - Exam at best with neuro 12/27 -- a drowsy patient who follows all simple commands and can verbalize  some correct answers to orientation questions. Significantly increased latencies of  motor and verbal responses. No seizure like activity or nuchal rigidity. Significant fluctuations, fairly limited exam for me today as above (non verbal, not following commands) - Imaging: - CT head: No acute intracranial process, with chronic lacunar infarcts noted.  - MRI brain (personally reviewed): Symmetrical restricted diffusion straddling the midline within the splenium of the corpus callosum, with subtle corresponding FLAIR hyperintensity. 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 Various etiologies can lead to this appearance including seizures, metabolic disturbance (including severe hyperglycemia which this patient has had multiple times), many medications, infection, infarct (less likely given minimal FLAIR changes). No other acute intracranial abnormality. Stable atrophy and white matter disease, likely reflecting the sequela of chronic microvascular ischemia. Remote lacunar infarcts of the basal ganglia and thalami bilaterally. Left mastoid effusion.  - EEG: 12/27 Continuous slow, generalized. This study is suggestive of mild to moderate diffuse encephalopathy.  12/30 Formal read pending - Fever curve gradually improved daily 12/24, 12/24, last fever 12/26; however Flu A positive 12/30 and now on Tamiflu   - Due to suspected NSTEMI, Cardiology was considering cardiac catheterization for evaluation of coronary anatomy and further assessment of aortic stenosis, however could not pursue as mental status not clearing / unable to consent  - DDx: - Initially presentation was felt to be most consistent with a septic encephalopathy. Moderate rather than high suspicion for meningitis due to no nuchal rigidity, but in the context of AMS, leukocytosis and fever, meningitis is certainly a consideration. Could not perform LP due to heparin  drip, which he was on for suspected  NSTEMI until discontinued by cardiology on 12/31. Evaluated by ID and meningits coverage stopped due to persistent encephalopathy. 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 Usually (but not always) these lesions are reversible if they are symmetric and without mass effect, as is the case with the current MRI.  Corpus callosum lesions can be associated with significant encephalopathy as well  . RECOMMENDATIONS  - LTM EEG with no seizures, will discontinue LTM EEG. - will plan for LP and MRI under anesthesia. He is to encephalopathic and earlier in the day tried to bite the staff and seemed agitated with slight tactile stimulation. - Neurology will follow along ______________________________________________________________________   Devoiry Corriher [facility]

## 2023-11-19 NOTE — Progress Notes (Signed)
 Speech Language Pathology Treatment: Dysphagia  Patient Details Name: [name] [name] Sr. MRN: [id number] DOB: August 03, 1961 Today's Date: 11/19/2023 Time: 8364-8356 SLP Time Calculation (min) (ACUTE ONLY): 8 min  Assessment / Plan / Recommendation Clinical Impression  Pt kept his eyes closed throughout the entirety of the session despite Max multimodal cueing. He demonstrated responses suspected to be secondary to pain/internal stimuli, although no coherent vocalizations were noted this date. He continues to have deficits related to focused attention with a subtle acute decline compared to visit earlier this week (12/30). Presented trials of gelatin via tspn to his lips, pt opened his mouth to receive bolus before quickly biting down on spoon. He required Mod cueing to release the spoon during multiple attempts. Continue to question his ability to support himself nutritionally as his lunch tray sat untouched. Recommend continuing current diet due to altered mentation. SLP will continue following.    HPI HPI: [name] LEVITZ Sr is a 63 yo male presenting to ED 12/22 after experiencing near syncope, chest pain, and hypotension during HD. Admitted with NSTEMI. Recently admitted 12/6-12/10 for a fall and uncontrolled hyperglycemia. PMH includes ESRD on HD, CHF, T2DM s/p R BKA, obesity      SLP Plan  Continue with current plan of care      Recommendations for follow up therapy are one component of a multi-disciplinary discharge planning process, led by the attending physician.  Recommendations may be updated based on patient status, additional functional criteria and insurance authorization.    Recommendations  Diet recommendations: Thin liquid Liquids provided via: Straw;Cup;Teaspoon Medication Administration: Whole meds with liquid Supervision: Staff to assist with self feeding;Full supervision/cueing for compensatory strategies Compensations: Minimize environmental distractions;Slow rate;Small  sips/bites Postural Changes and/or Swallow Maneuvers: Out of bed for meals;Seated upright 90 degrees                  Oral care BID   Frequent or constant Supervision/Assistance Dysarthria and anarthria (R47.1);Cognitive communication deficit (M58.158)     Continue with current plan of care     [name] [name]  11/19/2023, 4:53 PM

## 2023-11-19 NOTE — Procedures (Signed)
 Patient Name: [name] MERRYFIELD Sr.  MRN: [id number]  Epilepsy Attending: Arlin MALVA Krebs  Referring Physician/Provider: Jerrie Lola CROME, MD  Duration: 11/19/2023 0844 to 11/19/2023 1140   Patient history: 62yo M with ams getting eeg to evaluate for seizure   Level of alertness: Awake/ lethargic    AEDs during EEG study: None   Technical aspects: This EEG study was done with scalp electrodes positioned according to the 10-20 International system of electrode placement. Electrical activity was reviewed with band pass filter of 1-70Hz , sensitivity of 7 uV/mm, display speed of 65mm/sec with a 60Hz  notched filter applied as appropriate. EEG data were recorded continuously and digitally stored.  Video monitoring was available and reviewed as appropriate.   Description: EEG showed continuous generalized 3-5hz  theta-delta slowing. Hyperventilation and photic stimulation were not performed.     Patient was laying in bed with eyes closed and noted to have side to side head shaking on 11/19/2023 at 0928 and 1121 lasting for about 12 and 17 seconds respectively. Concomitant EEG before, during and after the event did not show any EEG.  These were nonepileptic events.     ABNORMALITY - Continuous slow, generalized   IMPRESSION: This study is suggestive of moderate diffuse encephalopathy. No seizures or epileptiform discharges were seen throughout the recording.  Patient was noted to be laying in bed and moving head side-to-side as described above without concomitant EEG change.  These events were nonepileptic.   Tremaine Earwood O Sophiya Morello

## 2023-11-19 NOTE — Plan of Care (Signed)
  Problem: Clinical Measurements: Goal: Respiratory complications will improve Outcome: Progressing Goal: Cardiovascular complication will be avoided Outcome: Progressing   Problem: Elimination: Goal: Will not experience complications related to bowel motility Outcome: Progressing   Problem: Pain Management: Goal: General experience of comfort will improve Outcome: Progressing   Problem: Education: Goal: Knowledge of General Education information will improve Description: Including pain rating scale, medication(s)/side effects and non-pharmacologic comfort measures Outcome: Not Progressing   Problem: Clinical Measurements: Goal: Ability to maintain clinical measurements within normal limits will improve Outcome: Not Progressing Goal: Diagnostic test results will improve Outcome: Not Progressing   Problem: Activity: Goal: Risk for activity intolerance will decrease Outcome: Not Progressing   Problem: Nutrition: Goal: Adequate nutrition will be maintained Outcome: Not Progressing   Problem: Elimination: Goal: Will not experience complications related to urinary retention Outcome: Not Progressing

## 2023-11-19 NOTE — Progress Notes (Signed)
 Chart reviewed. Patient has continued encephalopathy and not currently a candidate for cardiac interventions. Medical management underway as able.  When patient's mental status improves, please re-engage cardiology to reassess need for cardiac interventions.  We will sign off at this time.  Annel Zunker A Gaylyn Berish, MD

## 2023-11-19 NOTE — Anesthesia Preprocedure Evaluation (Addendum)
 Anesthesia Evaluation  Patient identified by MRN, date of birth, ID band Patient confused    Reviewed: Allergy & Precautions, NPO status , Patient's Chart, lab work & pertinent test results, Unable to perform ROS - Chart review only  Airway Mallampati: Unable to assess       Dental   Pulmonary COPD,  COPD inhaler    + decreased breath sounds      Cardiovascular hypertension, Pt. on medications pulmonary hypertension+ CAD, + Past MI and +CHF  + dysrhythmias  Rhythm:Regular  Echo 10/2023  1. Left ventricular ejection fraction, by estimation, is 30 to 35%. The left ventricle has moderately decreased function. The left ventricle demonstrates regional wall motion abnormalities (see scoring diagram/findings for description). There is moderate concentric left ventricular hypertrophy. Left ventricular diastolic parameters are consistent with Grade III diastolic dysfunction (restrictive).   2. Right ventricular systolic function is mildly reduced. The right ventricular size is normal. There is mildly elevated pulmonary artery systolic pressure. The estimated right ventricular systolic pressure is 42.2 mmHg.   3. Left atrial size was moderately dilated.   4. The mitral valve is degenerative. Trivial mitral valve regurgitation. Moderate mitral annular calcification.   5. The aortic valve is tricuspid. There is moderate calcification of the aortic valve. Aortic valve regurgitation is not visualized. Moderate to severe aortic valve stenosis - suspect upper end of scale, low flow/low gradient. Aortic valve area, by VTI measures 0.98 cm. Aortic valve mean gradient measures 22.0 mmHg. Dimention less index 0.19.   6. Aortic dilatation noted. There is borderline dilatation of the aortic root, measuring 40 mm.   7. The inferior vena cava is dilated in size with <50% respiratory variability, suggesting right atrial pressure of 15 mmHg.   Comparison(s): Prior  images reviewed side by side. LVEF has decreased, 30-35% range with basal inferoposterior akinesis and restrictive diastolic filling. Aortic stenosis possibly severe (low flow/low gradient). Mildly elevated RVSP.    ECHO 03/23: IMPRESSIONS 1. Left ventricular ejection fraction, by estimation, is 45 to 50%. The left ventricle has mildly decreased function. The left ventricle demonstrates global hypokinesis. There is moderate left ventricular hypertrophy. Left ventricular diastolic function could not be evaluated. 2. Right ventricular systolic function is mildly reduced. The right ventricular size is normal. 3. The mitral valve is degenerative. Mild mitral valve regurgitation. No evidence of mitral stenosis. Moderate to severe mitral annular calcification. 4. The aortic valve is tricuspid. There is moderate calcification of the aortic valve. There is moderate thickening of the aortic valve. Aortic valve regurgitation is not visualized. Mild aortic valve stenosis. Aortic valve area, by VTI measures 2.26  cm. Aortic valve mean gradient measures 8.0 mmHg. Aortic valve Vmax measures 1.92 m/s.  Conclusion(s)/Recommendation(s): No evidence of valvular vegetations on this transthoracic echocardiogram. Consider a transesophageal echocardiogram to exclude infective endocarditis if clinically indicated.    Neuro/Psych negative neurological ROS  negative psych ROS   GI/Hepatic Neg liver ROS,GERD  Medicated,,  Endo/Other  diabetes, Type 2, Insulin  Dependent    Renal/GU ESRF and DialysisRenal disease     Musculoskeletal negative musculoskeletal ROS (+)    Abdominal   Peds  Hematology  (+) Blood dyscrasia, anemia Lab Results      Component                Value               Date  WBC                      9.3                 07/04/2022                HGB                      10.2 (L)            07/04/2022                HCT                      33.9 (L)             07/04/2022                MCV                      79.0 (L)            07/04/2022                PLT                      380                 07/04/2022           Lab Results      Component                Value               Date                      NA                       132 (L)             07/04/2022                K                        3.5                 07/04/2022                CO2                      27                  07/04/2022                GLUCOSE                  227 (H)             07/04/2022                BUN                      28 (H)              07/04/2022                CREATININE  6.46 (H)            07/04/2022                CALCIUM                   9.1                 07/04/2022                GFRNONAA                 9 (L)               07/04/2022             Anesthesia Other Findings   Reproductive/Obstetrics                             Anesthesia Physical Anesthesia Plan  ASA: 4  Anesthesia Plan: MAC   Post-op Pain Management: Minimal or no pain anticipated   Induction: Intravenous  PONV Risk Score and Plan: 1 and Treatment may vary due to age or medical condition  Airway Management Planned: Natural Airway, Nasal Cannula and Simple Face Mask  Additional Equipment: None  Intra-op Plan:   Post-operative Plan:   Informed Consent:      Consent reviewed with POA and History available from chart only  Plan Discussed with: CRNA  Anesthesia Plan Comments: (E)        Anesthesia Quick Evaluation

## 2023-11-19 NOTE — Progress Notes (Signed)
 PROGRESS NOTE    [name] [name] [name] Sr.  FMW:[id number] DOB: [birth year] DOA: 11/08/2023 PCP: Pcp, No   Brief Narrative: [name] [name] [name] Sr. is a 63 y.o. male with medical history significant of ESRD on HD, (MWF) heart failure, T2DM, sp right BKA and obesity who presented after experiencing a near syncope during hemodialysis. Recent hospitalization 12/06 to 12/10 for uncontrolled hyperglycemia. Patient was having HD on 12/22 when he was noted to have chest pain and hypotension, along with a near syncope episode. Apparently treatment was stopped and EMS was called. He wss found hypotensive and was transported to the ED. His blood pressure was initially low in the ED 75/59, but improved to 106/72. He was found to have very elevated high sensitive troponin and cardiology was consulted for evaluation.  He was diagnosed with NSTEMI and plan to admit patient to [facility] under cardiology. Pt was transferred to [facility] for further evaluation.    Assessment and Plan:  Acute metabolic encephalopathy Initial concern for encephalitis vs meningitis. Neurology consulted. LP not obtained secondary to heparin  infusion. Patient started empirically on Vancomycin / Cefepime / Flagyl , transitioned to Vancomycin  and Zosyn  as Cefepime  discontinued for fear of possibly contributing to mental status change. Zosyn  transition to Ceftriaxone , ampicillin  and Acyclovir  for CNS coverage. EEG obtained on 11/13/23 and 1/1 significant for no evidence of seizure activity. Neurology plan for LP under anesthesia.  Fever In setting of metabolic encephalopathy. Concern for CNS etiology of infection and patient treated as mentioned above. Patient also found to have an influenza infection. Fever curve has improved with resolution of fevers.  NSTEMI Cardiology consulted. Troponin elevation with peak of 8,942. Patient managed medically on Heparin  IV, aspirin  and Lipitor. Transthoracic Echocardiogram significant for an LVEF of 30-35% with  regional wall motion abnormalities, in addition to mildly reduced RV function and moderate-severe aortic stenosis. Cardiology signed off on 1/2 with recommendations for re-consult once patient's mental status has improved.  ESRD on HD Nephrology on board for hemodialysis needs.  Acute on chronic diastolic heart failure Fluid management via hemodialysis.  Diarrhea C. Difficile negative. Improved. Possibly antibiotic related.  Diabetes mellitus type 2 Uncontrolled with hyperglycemia; last hemoglobin A1C of >15.5%. -Continue Semglee  and SSI  History of CVA -Continue ASA, and Lipitor   DVT prophylaxis: Heparin  subcutaneous Code Status:   Code Status: Full Code Family Communication: Daughter on telephone (11/19/23) Disposition Plan: Discharge pending ongoing workup/management   Consultants:    Procedures:    Antimicrobials: Vancomycin  Zosyn  Oseltaivir Flagyl  Ceftriaxone  Cefepime  Acyclovir  Ampicillin     Subjective: Impaired by mental status  Objective: BP (!) 102/56 (BP Location: Right Arm)   Pulse 89   Temp 98.8 F (37.1 C) (Axillary)   Resp 20   Ht 6' 1 (1.854 m)   Wt 102.2 kg   SpO2 90%   BMI 29.73 kg/m   Examination:  General exam: Appears calm and comfortable Respiratory system: Clear to auscultation. Respiratory effort normal. Cardiovascular system: S1 & S2 heard, RRR. No murmurs. Gastrointestinal system: Abdomen is nondistended, soft and nontender. Normal bowel sounds heard. Central nervous system: Asleep but arouses easily (does not keep his eyes open). Patient responds to some questions but not very coherently.    Data Reviewed: I have personally reviewed following labs and imaging studies  CBC Lab Results  Component Value Date   WBC 7.4 11/18/2023   RBC 5.55 11/18/2023   HGB 15.8 11/18/2023   HCT 48.1 11/18/2023   MCV 86.7 11/18/2023   MCH 28.5 11/18/2023  PLT 454 (H) 11/18/2023   MCHC 32.8 11/18/2023   RDW 14.8 11/18/2023    LYMPHSABS 1.3 11/18/2023   MONOABS 1.7 (H) 11/18/2023   EOSABS 0.3 11/18/2023   BASOSABS 0.1 11/18/2023     Last metabolic panel Lab Results  Component Value Date   NA 136 11/18/2023   K 4.3 11/18/2023   CL 95 (L) 11/18/2023   CO2 21 (L) 11/18/2023   BUN 63 (H) 11/18/2023   CREATININE 14.18 (H) 11/18/2023   GLUCOSE 158 (H) 11/18/2023   GFRNONAA 4 (L) 11/18/2023   GFRAA 3 (L) 08/14/2019   CALCIUM  9.9 11/18/2023   PHOS 6.1 (H) 11/18/2023   PROT 8.6 (H) 11/17/2023   ALBUMIN  2.8 (L) 11/18/2023   LABGLOB 5.1 (H) 12/17/2022   AGRATIO 0.7 11/20/2022   BILITOT 0.9 11/17/2023   ALKPHOS 78 11/17/2023   AST 27 11/17/2023   ALT 23 11/17/2023   ANIONGAP 20 (H) 11/18/2023    GFR: Estimated Creatinine Clearance: 6.9 mL/min (A) (by C-G formula based on SCr of 14.18 mg/dL (H)).  Recent Results (from the past 240 hours)  MRSA Next Gen by PCR, Nasal     Status: Abnormal   Collection Time: 11/09/23  4:15 PM   Specimen: Nasal Mucosa; Nasal Swab  Result Value Ref Range Status   MRSA by PCR Next Gen DETECTED (A) NOT DETECTED Final    Comment: RESULT CALLED TO, READ BACK BY AND VERIFIED WITH: C HALL,RN@[id number]  11/09/23 MK (NOTE) The GeneXpert MRSA Assay (FDA approved for NASAL specimens only), is one component of a comprehensive MRSA colonization surveillance program. It is not intended to diagnose MRSA infection nor to guide or monitor treatment for MRSA infections. Test performance is not FDA approved in patients less than 26 years old. Performed at [facility], [address] [place], [place] [address]   Culture, blood (Routine X 2) w Reflex to ID Panel     Status: None   Collection Time: 11/10/23  3:03 PM   Specimen: BLOOD RIGHT ARM  Result Value Ref Range Status   Specimen Description BLOOD RIGHT ARM  Final   Special Requests   Final    BOTTLES DRAWN AEROBIC AND ANAEROBIC Blood Culture results may not be optimal due to an inadequate volume of blood received in culture  bottles   Culture   Final    NO GROWTH 5 DAYS Performed at [facility], [address], [place], [place] [address]    Report Status 11/15/2023 FINAL  Final  Culture, blood (Routine X 2) w Reflex to ID Panel     Status: None   Collection Time: 11/10/23  3:08 PM   Specimen: BLOOD RIGHT ARM  Result Value Ref Range Status   Specimen Description BLOOD RIGHT ARM  Final   Special Requests   Final    BOTTLES DRAWN AEROBIC ONLY Blood Culture results may not be optimal due to an inadequate volume of blood received in culture bottles   Culture   Final    NO GROWTH 5 DAYS Performed at [facility], [address] [place], [place] [address]    Report Status 11/15/2023 FINAL  Final  C Difficile Quick Screen w PCR reflex     Status: None   Collection Time: 11/12/23  8:37 AM   Specimen: STOOL  Result Value Ref Range Status   C Diff antigen NEGATIVE NEGATIVE Final   C Diff toxin NEGATIVE NEGATIVE Final   C Diff interpretation No C.  difficile detected.  Final    Comment: Performed at [facility], [address] [place], [place] [address]  Respiratory (~20 pathogens) panel by PCR     Status: Abnormal   Collection Time: 11/16/23  2:32 PM   Specimen: Nasopharyngeal Swab; Respiratory  Result Value Ref Range Status   Adenovirus NOT DETECTED NOT DETECTED Final   Coronavirus 229E NOT DETECTED NOT DETECTED Final    Comment: (NOTE) The Coronavirus on the Respiratory Panel, DOES NOT test for the novel  Coronavirus (2019 nCoV)    Coronavirus HKU1 NOT DETECTED NOT DETECTED Final   Coronavirus NL63 NOT DETECTED NOT DETECTED Final   Coronavirus OC43 NOT DETECTED NOT DETECTED Final   Metapneumovirus NOT DETECTED NOT DETECTED Final   Rhinovirus / Enterovirus NOT DETECTED NOT DETECTED Final   Influenza A H1 2009 DETECTED (A) NOT DETECTED Final   Influenza B NOT DETECTED NOT DETECTED Final   Parainfluenza Virus 1 NOT DETECTED NOT DETECTED Final   Parainfluenza Virus 2 NOT DETECTED NOT  DETECTED Final   Parainfluenza Virus 3 NOT DETECTED NOT DETECTED Final   Parainfluenza Virus 4 NOT DETECTED NOT DETECTED Final   Respiratory Syncytial Virus NOT DETECTED NOT DETECTED Final   Bordetella pertussis NOT DETECTED NOT DETECTED Final   Bordetella Parapertussis NOT DETECTED NOT DETECTED Final   Chlamydophila pneumoniae NOT DETECTED NOT DETECTED Final   Mycoplasma pneumoniae NOT DETECTED NOT DETECTED Final    Comment: Performed at [facility], [address], [place], [place] [address]  SARS Coronavirus 2 by RT PCR (hospital order, performed in [facility] hospital lab) *cepheid single result test* Anterior Nasal Swab     Status: None   Collection Time: 11/16/23  2:32 PM   Specimen: Anterior Nasal Swab  Result Value Ref Range Status   SARS Coronavirus 2 by RT PCR NEGATIVE NEGATIVE Final    Comment: Performed at [facility], [address], [place], [place] [address]      Radiology Studies: EEG adult Result Date: 11/18/2023 [name] [name] Arlin KIDD, MD     11/18/2023 10:17 AM Patient Name: [name] MOFFA Sr. MRN: [id number] Epilepsy Attending: Arlin KIDD [name] [name] Referring Physician/Provider: Perri DELENA Meliton Mickey., MD Date: 11/17/2023 Duration: 22.41 mins  Patient history: 63yo M with ams getting eeg to evaluate for seizure  Level of alertness: Awake/ lethargic  AEDs during EEG study: None  Technical aspects: This EEG study was done with scalp electrodes positioned according to the 10-20 International system of electrode placement. Electrical activity was reviewed with band pass filter of 1-70Hz , sensitivity of 7 uV/mm, display speed of 37mm/sec with a 60Hz  notched filter applied as appropriate. EEG data were recorded continuously and digitally stored.  Video monitoring was available and reviewed as appropriate.  Description: EEG showed continuous generalized 3-5hz  theta-delta slowing.Hyperventilation and photic stimulation were not performed.    ABNORMALITY - Continuous slow, generalized   IMPRESSION: This study is suggestive of moderate diffuse encephalopathy. No seizures or epileptiform discharges were seen throughout the recording.  Priyanka KIDD [name] [name]   CT HEAD WO CONTRAST ( ) Result Date: 11/18/2023 CLINICAL DATA:  Delirium EXAM: CT HEAD WITHOUT CONTRAST TECHNIQUE: Contiguous axial images were obtained from the base of the skull through the vertex without intravenous contrast. RADIATION DOSE REDUCTION: This exam was performed according to the departmental dose-optimization program which includes automated exposure control, adjustment of the mA and/or kV according to patient size and/or use of iterative reconstruction technique. COMPARISON:  Brain MRI 11/11/2023 FINDINGS:  Brain: No evidence of acute infarction, hemorrhage, hydrocephalus, extra-axial collection or mass lesion/mass effect. Chronic lacunar infarcts at the bilateral basal ganglia. Ischemic gliosis in the cerebral white matter is mild. Mild for age cerebral volume loss. Vascular: No hyperdense vessel or unexpected calcification. Skull: Normal. Negative for fracture or focal lesion. Sinuses/Orbits: No acute finding. IMPRESSION: No acute finding or change since brain MRI 11/11/2023. Electronically Signed   By: Dorn Roulette M.D.   On: 11/18/2023 09:02      LOS: 11 days    Elgin Lam, MD [facility] 11/19/2023, 8:22 AM   If 7PM-7AM, please contact night-coverage [email or web address]

## 2023-11-19 NOTE — Consult Note (Signed)
 WOC Nurse Consult Note: Reason for Consult: Requested to assess a wound stage 3 on the buttock charted  Wound type: no wound found it.  Dressing procedure/placement/frequency: Apply foam dressing on the sacrum to prevent further injury. Change 3Q  WOC team will not plan to follow further.  Please reconsult if further assistance is needed. Thank-you,  Lela Holm BSN, RN, [employer], WOC  (Pager: [phone])

## 2023-11-19 NOTE — Care Management Important Message (Signed)
 Important Message  Patient Details  Name: [name] NHAN Sr. MRN: [id number] Date of Birth: 03-29-1961   Important Message Given:        Renie Ora 11/19/2023, 11:18 AM

## 2023-11-20 ENCOUNTER — Encounter (HOSPITAL_COMMUNITY): Payer: Self-pay | Admitting: Internal Medicine

## 2023-11-20 ENCOUNTER — Inpatient Hospital Stay (HOSPITAL_COMMUNITY): Payer: Medicare Other | Admitting: Anesthesiology

## 2023-11-20 ENCOUNTER — Inpatient Hospital Stay (HOSPITAL_COMMUNITY): Payer: Medicare Other | Admitting: Certified Registered Nurse Anesthetist

## 2023-11-20 ENCOUNTER — Encounter (HOSPITAL_COMMUNITY)
Admission: EM | Disposition: A | Discharge status: Skilled Nursing Facility | Payer: Self-pay | Source: Home / Self Care | Attending: Internal Medicine

## 2023-11-20 ENCOUNTER — Inpatient Hospital Stay (HOSPITAL_COMMUNITY): Payer: Medicare Other

## 2023-11-20 DIAGNOSIS — G9341 Metabolic encephalopathy: Secondary | ICD-10-CM | POA: Diagnosis not present

## 2023-11-20 DIAGNOSIS — N186 End stage renal disease: Secondary | ICD-10-CM | POA: Diagnosis not present

## 2023-11-20 DIAGNOSIS — I214 Non-ST elevation (NSTEMI) myocardial infarction: Secondary | ICD-10-CM | POA: Diagnosis not present

## 2023-11-20 DIAGNOSIS — I5023 Acute on chronic systolic (congestive) heart failure: Secondary | ICD-10-CM | POA: Diagnosis not present

## 2023-11-20 HISTORY — PX: RADIOLOGY WITH ANESTHESIA: SHX6223

## 2023-11-20 HISTORY — PX: IR LUMBAR PUNCTURE: IMG944

## 2023-11-20 LAB — MENINGITIS/ENCEPHALITIS PANEL (CSF)

## 2023-11-20 LAB — RENAL FUNCTION PANEL
Albumin: 3 g/dL — ABNORMAL LOW (ref 3.5–5.0)
Anion gap: 23 — ABNORMAL HIGH (ref 5–15)
BUN: 64 mg/dL — ABNORMAL HIGH (ref 8–23)
CO2: 18 mmol/L — ABNORMAL LOW (ref 22–32)
Calcium: 10.2 mg/dL (ref 8.9–10.3)
Chloride: 94 mmol/L — ABNORMAL LOW (ref 98–111)
Creatinine, Ser: 13.22 mg/dL — ABNORMAL HIGH (ref 0.61–1.24)
GFR, Estimated: 4 mL/min — ABNORMAL LOW (ref >60)
Glucose, Bld: 223 mg/dL — ABNORMAL HIGH (ref 70–99)
Phosphorus: 6.5 mg/dL — ABNORMAL HIGH (ref 2.5–4.6)
Potassium: 5.1 mmol/L (ref 3.5–5.1)
Sodium: 135 mmol/L (ref 135–145)

## 2023-11-20 LAB — OCCULT BLOOD X 1 CARD TO LAB, STOOL: Fecal Occult Bld: POSITIVE — AB

## 2023-11-20 LAB — GLUCOSE, CAPILLARY
Glucose-Capillary: 229 mg/dL — ABNORMAL HIGH (ref 70–99)
Glucose-Capillary: 209 mg/dL — ABNORMAL HIGH (ref 70–99)
Glucose-Capillary: 197 mg/dL — ABNORMAL HIGH (ref 70–99)
Glucose-Capillary: 239 mg/dL — ABNORMAL HIGH (ref 70–99)

## 2023-11-20 LAB — CBC
HCT: 48.7 % (ref 39.0–52.0)
Hemoglobin: 15.8 g/dL (ref 13.0–17.0)
MCH: 28.9 pg (ref 26.0–34.0)
MCHC: 32.4 g/dL (ref 30.0–36.0)
MCV: 89 fL (ref 80.0–100.0)
Platelets: 444 10*3/uL — ABNORMAL HIGH (ref 150–400)
RBC: 5.47 MIL/uL (ref 4.22–5.81)
RDW: 14.9 % (ref 11.5–15.5)
WBC: 9.8 10*3/uL (ref 4.0–10.5)
nRBC: 1.4 % — ABNORMAL HIGH (ref 0.0–0.2)

## 2023-11-20 LAB — PROTEIN AND GLUCOSE, CSF
Glucose, CSF: 128 mg/dL — ABNORMAL HIGH (ref 40–70)
Total  Protein, CSF: 76 mg/dL — ABNORMAL HIGH (ref 15–45)

## 2023-11-20 LAB — CSF CELL COUNT WITH DIFFERENTIAL
RBC Count, CSF: 1 /mm3 — ABNORMAL HIGH
Tube #: 3
WBC, CSF: 2 /mm3 (ref 0–5)

## 2023-11-20 SURGERY — MRI WITH ANESTHESIA
Anesthesia: Monitor Anesthesia Care

## 2023-11-20 SURGERY — IR WITH ANESTHESIA
Anesthesia: General

## 2023-11-20 MED ORDER — PROPOFOL 10 MG/ML IV BOLUS
INTRAVENOUS | Status: DC | PRN
  Administered 2023-11-20 (×3): 30 mg via INTRAVENOUS

## 2023-11-20 MED ORDER — ALBUMIN HUMAN 5 % IV SOLN
INTRAVENOUS | Status: AC
  Administered 2023-11-20: 12.5 g
  Filled 2023-11-20: qty 250

## 2023-11-20 MED ORDER — SODIUM CHLORIDE 0.9 % IV BOLUS
500.0000 mL | Freq: Once | INTRAVENOUS | Status: AC
  Administered 2023-11-20: 500 mL via INTRAVENOUS

## 2023-11-20 MED ORDER — ORAL CARE MOUTH RINSE: 15.0000 mL | Freq: Once | OROMUCOSAL | Status: AC

## 2023-11-20 MED ORDER — PHENYLEPHRINE 80 MCG/ML (10ML) SYRINGE FOR IV PUSH (FOR BLOOD PRESSURE SUPPORT)
PREFILLED_SYRINGE | INTRAVENOUS | Status: DC | PRN
  Administered 2023-11-20: 120 ug via INTRAVENOUS
  Administered 2023-11-20 (×2): 160 ug via INTRAVENOUS
  Administered 2023-11-20: 80 ug via INTRAVENOUS

## 2023-11-20 MED ORDER — SODIUM CHLORIDE 0.9 % IV SOLN: INTRAVENOUS | Status: DC

## 2023-11-20 MED ORDER — CHLORHEXIDINE GLUCONATE 0.12 % MT SOLN
15.0000 mL | Freq: Once | OROMUCOSAL | Status: AC
  Administered 2023-11-20: 15 mL via OROMUCOSAL

## 2023-11-20 NOTE — Progress Notes (Signed)
 PT Cancellation Note  Patient Details Name: [name] CREQUE Sr. MRN: [id number] DOB: Dec 22, 1960   Cancelled Treatment:    Reason Eval/Treat Not Completed: Patient at procedure or test/unavailable (Pt of unit for procedure (LP). Will follow up at later date/time as schedule allows and pt able.)   Vernell KNIGHTS PT, DPT Acute Rehabilitation Services Office [phone]  11/20/23 9:18 AM

## 2023-11-20 NOTE — Anesthesia Preprocedure Evaluation (Signed)
 Anesthesia Evaluation  Patient identified by MRN, date of birth, ID band Patient confused    Reviewed: Allergy & Precautions, NPO status , Patient's Chart, lab work & pertinent test results, Unable to perform ROS - Chart review only  Airway Mallampati: Unable to assess       Dental  (+) Dental Advisory Given   Pulmonary COPD,  COPD inhaler    + decreased breath sounds      Cardiovascular hypertension, Pt. on medications pulmonary hypertension+ CAD, + Past MI and +CHF  + dysrhythmias  Rhythm:Regular  Echo 10/2023  1. Left ventricular ejection fraction, by estimation, is 30 to 35%. The left ventricle has moderately decreased function. The left ventricle demonstrates regional wall motion abnormalities (see scoring diagram/findings for description). There is moderate concentric left ventricular hypertrophy. Left ventricular diastolic parameters are consistent with Grade III diastolic dysfunction (restrictive).   2. Right ventricular systolic function is mildly reduced. The right ventricular size is normal. There is mildly elevated pulmonary artery systolic pressure. The estimated right ventricular systolic pressure is 42.2 mmHg.   3. Left atrial size was moderately dilated.   4. The mitral valve is degenerative. Trivial mitral valve regurgitation. Moderate mitral annular calcification.   5. The aortic valve is tricuspid. There is moderate calcification of the aortic valve. Aortic valve regurgitation is not visualized. Moderate to severe aortic valve stenosis - suspect upper end of scale, low flow/low gradient. Aortic valve area, by VTI measures 0.98 cm. Aortic valve mean gradient measures 22.0 mmHg. Dimention less index 0.19.   6. Aortic dilatation noted. There is borderline dilatation of the aortic root, measuring 40 mm.   7. The inferior vena cava is dilated in size with <50% respiratory variability, suggesting right atrial pressure of 15  mmHg.   Comparison(s): Prior images reviewed side by side. LVEF has decreased, 30-35% range with basal inferoposterior akinesis and restrictive diastolic filling. Aortic stenosis possibly severe (low flow/low gradient). Mildly elevated RVSP.    ECHO 03/23: IMPRESSIONS    1. Left ventricular ejection fraction, by estimation, is 45 to 50%. The left ventricle has mildly decreased function. The left ventricle demonstrates global hypokinesis. There is moderate left ventricular hypertrophy. Left ventricular diastolic function  could not be evaluated.  2. Right ventricular systolic function is mildly reduced. The right ventricular size is normal.  3. The mitral valve is degenerative. Mild mitral valve regurgitation. No evidence of mitral stenosis. Moderate to severe mitral annular calcification.  4. The aortic valve is tricuspid. There is moderate calcification of the aortic valve. There is moderate thickening of the aortic valve. Aortic valve regurgitation is not visualized. Mild aortic valve stenosis. Aortic valve area, by VTI measures 2.26  cm. Aortic valve mean gradient measures 8.0 mmHg. Aortic valve Vmax measures 1.92 m/s.   Conclusion(s)/Recommendation(s): No evidence of valvular vegetations on this transthoracic echocardiogram. Consider a transesophageal echocardiogram to exclude infective endocarditis if clinically indicated.    Neuro/Psych negative neurological ROS  negative psych ROS   GI/Hepatic Neg liver ROS,GERD  Medicated,,  Endo/Other  diabetes, Type 2, Insulin  Dependent    Renal/GU ESRF and DialysisRenal disease     Musculoskeletal negative musculoskeletal ROS (+)    Abdominal   Peds  Hematology  (+) Blood dyscrasia, anemia Lab Results      Component                Value               Date  WBC                      9.3                 07/04/2022                HGB                      10.2 (L)            07/04/2022                HCT                       33.9 (L)            07/04/2022                MCV                      79.0 (L)            07/04/2022                PLT                      380                 07/04/2022           Lab Results      Component                Value               Date                      NA                       132 (L)             07/04/2022                K                        3.5                 07/04/2022                CO2                      27                  07/04/2022                GLUCOSE                  227 (H)             07/04/2022                BUN                      28 (H)              07/04/2022                CREATININE  6.46 (H)            07/04/2022                CALCIUM                   9.1                 07/04/2022                GFRNONAA                 9 (L)               07/04/2022             Anesthesia Other Findings   Reproductive/Obstetrics                              Anesthesia Physical Anesthesia Plan  ASA: 4  Anesthesia Plan: MAC   Post-op Pain Management: Minimal or no pain anticipated   Induction: Intravenous  PONV Risk Score and Plan: 1 and Propofol  infusion  Airway Management Planned: Oral ETT, Nasal Cannula, Simple Face Mask and Natural Airway  Additional Equipment:   Intra-op Plan:   Post-operative Plan:   Informed Consent:      Consent reviewed with POA and History available from chart only  Plan Discussed with: CRNA  Anesthesia Plan Comments: (E)         Anesthesia Quick Evaluation

## 2023-11-20 NOTE — Transfer of Care (Signed)
 Immediate Anesthesia Transfer of Care Note  Patient: [name] [name]  Procedure(s) Performed: IR WITH ANESTHESIA  Patient Location: PACU  Anesthesia Type:MAC  Level of Consciousness: awake and responds to stimulation  Airway & Oxygen Therapy: Patient Spontanous Breathing and Patient connected to face mask oxygen  Post-op Assessment: Report given to RN and Post -op Vital signs reviewed and stable  Post vital signs: Reviewed and stable  Last Vitals:  Vitals Value Taken Time  BP 102/61 11/20/23 1115  Temp 36.6 C 11/20/23 1113  Pulse 93 11/20/23 1120  Resp 20 11/20/23 1120  SpO2 97 % 11/20/23 1120  Vitals shown include unfiled device data.  Last Pain:  Vitals:   11/20/23 0747  TempSrc: Axillary  PainSc:       Patients Stated Pain Goal: 0 (11/15/23 2003)  Complications: No notable events documented.

## 2023-11-20 NOTE — Transfer of Care (Signed)
 Immediate Anesthesia Transfer of Care Note  Patient: [name] [name]  Procedure(s) Performed: MRI WITH ANESTHESIA  Patient Location: PACU  Anesthesia Type:MAC  Level of Consciousness: confused  Airway & Oxygen Therapy: Patient Spontanous Breathing  Post-op Assessment: Report given to RN and Post -op Vital signs reviewed and stable  Post vital signs: Reviewed and stable  Last Vitals:  Vitals Value Taken Time  BP    Temp    Pulse    Resp    SpO2      Last Pain:  Vitals:   11/20/23 1200  TempSrc:   PainSc: Asleep      Patients Stated Pain Goal: 0 (11/15/23 2003)  Complications: No notable events documented.

## 2023-11-20 NOTE — Progress Notes (Addendum)
 PROGRESS NOTE    [name] [name]  FMW:[id number] DOB: [birth year] DOA: 11/08/2023 PCP: Pcp, No   Brief Narrative: [name] [name] is a 63 y.o. male with medical history significant of ESRD on HD, (MWF) heart failure, T2DM, sp right BKA and obesity who presented after experiencing a near syncope during hemodialysis. Recent hospitalization 12/06 to 12/10 for uncontrolled hyperglycemia. Patient was having HD on 12/22 when he was noted to have chest pain and hypotension, along with a near syncope episode. Apparently treatment was stopped and EMS was called. He wss found hypotensive and was transported to the ED. His blood pressure was initially low in the ED 75/59, but improved to 106/72. He was found to have very elevated high sensitive troponin and cardiology was consulted for evaluation.  He was diagnosed with NSTEMI and plan to admit patient to [facility] under cardiology. Pt was transferred to [facility] for further evaluation.    Assessment and Plan:  Acute metabolic encephalopathy Initial concern for encephalitis vs meningitis. Neurology consulted. LP not obtained secondary to heparin  infusion. Patient started empirically on Vancomycin / Cefepime / Flagyl , transitioned to Vancomycin  and Zosyn  as Cefepime  discontinued for fear of possibly contributing to mental status change. Zosyn  transition to Ceftriaxone , ampicillin  and Acyclovir  for CNS coverage. EEG obtained on 11/13/23 and 1/1 significant for no evidence of seizure activity. Neurology plan for LP and MRI under anesthesia today. -Follow-up CSF labs -Follow-up MRI  Fever In setting of metabolic encephalopathy. Concern for CNS etiology of infection and patient treated as mentioned above. Patient also found to have an influenza infection. Fever curve has improved with resolution of fevers.  NSTEMI Cardiology consulted. Troponin elevation with peak of 8,942. Patient managed medically on Heparin  IV, aspirin  and Lipitor. Transthoracic  Echocardiogram significant for an LVEF of 30-35% with regional wall motion abnormalities, in addition to mildly reduced RV function and moderate-severe aortic stenosis. Cardiology signed off on 1/2 with recommendations for re-consult once patient's mental status has improved.  ESRD on HD Nephrology on board for hemodialysis needs.  Acute on chronic diastolic heart failure Fluid management via hemodialysis.  Diarrhea C. Difficile negative. Improved. Possibly antibiotic related.  Diabetes mellitus type 2 Uncontrolled with hyperglycemia; last hemoglobin A1C of >15.5%. -Continue Semglee  and SSI  History of CVA -Continue ASA, and Lipitor  Occult positive stool Unclear etiology. Hemoglobin is currently stable. -Will consider GI consultation once patient is more stable or if he develops progressive anemia. Will hold chemical DVT prophylaxis   FEN/GI Patient with poor oral intake secondary to mental status. Seen by speech therapy. Currently on a full liquid diet. Discussed with family. Plan to introduce oral intake today, but if patient does not tolerate, will consider insertion of a small bore feeding tube and initiation of food supplement.   DVT prophylaxis: SCDs (left leg) Code Status:   Code Status: Full Code Family Communication: Daughter on telephone (11/20/23) Disposition Plan: Discharge pending ongoing workup/management   Consultants:    Procedures:    Antimicrobials: Vancomycin  Zosyn  Oseltaivir Flagyl  Ceftriaxone  Cefepime  Acyclovir  Ampicillin     Subjective: Patient nods yes when asked if he's doing okay. Seen in short stay.  Objective: BP (!) 97/56   Pulse 92   Temp 98.8 F (37.1 C) (Axillary)   Resp 19   Ht 6' 1 (1.854 m)   Wt 102.2 kg   SpO2 95%   BMI 29.73 kg/m   Examination:  General exam: Appears calm and comfortable Respiratory system: Clear to auscultation. Respiratory effort normal. Cardiovascular  system: S1 & S2 heard,  RRR. Gastrointestinal system: Abdomen is nondistended, soft and nontender. Normal bowel sounds heard. Central nervous system: Appears to be alert, but won't open his eyes. Does not follow my simple commands but responded in the affirmative when asked how he was doing.   Data Reviewed: I have personally reviewed following labs and imaging studies  CBC Lab Results  Component Value Date   WBC 9.8 11/20/2023   RBC 5.47 11/20/2023   HGB 15.8 11/20/2023   HCT 48.7 11/20/2023   MCV 89.0 11/20/2023   MCH 28.9 11/20/2023   PLT 444 (H) 11/20/2023   MCHC 32.4 11/20/2023   RDW 14.9 11/20/2023   LYMPHSABS 1.3 11/18/2023   MONOABS 1.7 (H) 11/18/2023   EOSABS 0.3 11/18/2023   BASOSABS 0.1 11/18/2023     Last metabolic panel Lab Results  Component Value Date   NA 135 11/20/2023   K 5.1 11/20/2023   CL 94 (L) 11/20/2023   CO2 18 (L) 11/20/2023   BUN 64 (H) 11/20/2023   CREATININE 13.22 (H) 11/20/2023   GLUCOSE 223 (H) 11/20/2023   GFRNONAA 4 (L) 11/20/2023   GFRAA 3 (L) 08/14/2019   CALCIUM  10.2 11/20/2023   PHOS 6.5 (H) 11/20/2023   PROT 8.6 (H) 11/17/2023   ALBUMIN  3.0 (L) 11/20/2023   LABGLOB 5.1 (H) 12/17/2022   AGRATIO 0.7 11/20/2022   BILITOT 0.9 11/17/2023   ALKPHOS 78 11/17/2023   AST 27 11/17/2023   ALT 23 11/17/2023   ANIONGAP 23 (H) 11/20/2023    GFR: Estimated Creatinine Clearance: 7.4 mL/min (A) (by C-G formula based on SCr of 13.22 mg/dL (H)).  Recent Results (from the past 240 hours)  Culture, blood (Routine X 2) w Reflex to ID Panel     Status: None   Collection Time: 11/10/23  3:03 PM   Specimen: BLOOD RIGHT ARM  Result Value Ref Range Status   Specimen Description BLOOD RIGHT ARM  Final   Special Requests   Final    BOTTLES DRAWN AEROBIC AND ANAEROBIC Blood Culture results may not be optimal due to an inadequate volume of blood received in culture bottles   Culture   Final    NO GROWTH 5 DAYS Performed at [facility], [address]  [place], [place] [address]    Report Status 11/15/2023 FINAL  Final  Culture, blood (Routine X 2) w Reflex to ID Panel     Status: None   Collection Time: 11/10/23  3:08 PM   Specimen: BLOOD RIGHT ARM  Result Value Ref Range Status   Specimen Description BLOOD RIGHT ARM  Final   Special Requests   Final    BOTTLES DRAWN AEROBIC ONLY Blood Culture results may not be optimal due to an inadequate volume of blood received in culture bottles   Culture   Final    NO GROWTH 5 DAYS Performed at [facility], [address], [place], [place] [address]    Report Status 11/15/2023 FINAL  Final  C Difficile Quick Screen w PCR reflex     Status: None   Collection Time: 11/12/23  8:37 AM   Specimen: STOOL  Result Value Ref Range Status   C Diff antigen NEGATIVE NEGATIVE Final   C Diff toxin NEGATIVE NEGATIVE Final   C Diff interpretation No C. difficile detected.  Final    Comment: Performed at [facility], [address], [place], [place] [address]  Respiratory (~20 pathogens) panel by PCR  Status: Abnormal   Collection Time: 11/16/23  2:32 PM   Specimen: Nasopharyngeal Swab; Respiratory  Result Value Ref Range Status   Adenovirus NOT DETECTED NOT DETECTED Final   Coronavirus 229E NOT DETECTED NOT DETECTED Final    Comment: (NOTE) The Coronavirus on the Respiratory Panel, DOES NOT test for the novel  Coronavirus (2019 nCoV)    Coronavirus HKU1 NOT DETECTED NOT DETECTED Final   Coronavirus NL63 NOT DETECTED NOT DETECTED Final   Coronavirus OC43 NOT DETECTED NOT DETECTED Final   Metapneumovirus NOT DETECTED NOT DETECTED Final   Rhinovirus / Enterovirus NOT DETECTED NOT DETECTED Final   Influenza A H1 2009 DETECTED (A) NOT DETECTED Final   Influenza B NOT DETECTED NOT DETECTED Final   Parainfluenza Virus 1 NOT DETECTED NOT DETECTED Final   Parainfluenza Virus 2 NOT DETECTED NOT DETECTED Final   Parainfluenza Virus 3 NOT DETECTED NOT DETECTED Final   Parainfluenza Virus 4 NOT  DETECTED NOT DETECTED Final   Respiratory Syncytial Virus NOT DETECTED NOT DETECTED Final   Bordetella pertussis NOT DETECTED NOT DETECTED Final   Bordetella Parapertussis NOT DETECTED NOT DETECTED Final   Chlamydophila pneumoniae NOT DETECTED NOT DETECTED Final   Mycoplasma pneumoniae NOT DETECTED NOT DETECTED Final    Comment: Performed at [facility], [address], [place], [place] [address]  SARS Coronavirus 2 by RT PCR (hospital order, performed in [facility] hospital lab) *cepheid single result test* Anterior Nasal Swab     Status: None   Collection Time: 11/16/23  2:32 PM   Specimen: Anterior Nasal Swab  Result Value Ref Range Status   SARS Coronavirus 2 by RT PCR NEGATIVE NEGATIVE Final    Comment: Performed at [facility], [address] [place], [place] [address]      Radiology Studies: Overnight EEG with video Result Date: 11/19/2023 Shelton Arlin KIDD, MD     11/19/2023 12:11 PM Patient Name: [name] [name] Sr. MRN: [id number] Epilepsy Attending: Arlin KIDD Shelton Referring Physician/Provider: Jerrie Lola CROME, MD Duration: 11/18/2023 9155 to 11/19/2023 9155  Patient history: 63yo M with ams getting eeg to evaluate for seizure  Level of alertness: Awake/ lethargic  AEDs during EEG study: None  Technical aspects: This EEG study was done with scalp electrodes positioned according to the 10-20 International system of electrode placement. Electrical activity was reviewed with band pass filter of 1-70Hz , sensitivity of 7 uV/mm, display speed of 68mm/sec with a 60Hz  notched filter applied as appropriate. EEG data were recorded continuously and digitally stored.  Video monitoring was available and reviewed as appropriate.  Description: EEG showed continuous generalized 3-5hz  theta-delta slowing. Hyperventilation and photic stimulation were not performed.    ABNORMALITY - Continuous slow, generalized  IMPRESSION: This study is suggestive of moderate diffuse encephalopathy. No seizures  or epileptiform discharges were seen throughout the recording.  Arlin KIDD Shelton      LOS: 12 days    Elgin Lam, MD [facility] 11/20/2023, 10:41 AM   If 7PM-7AM, please contact night-coverage [email or web address]

## 2023-11-20 NOTE — Plan of Care (Signed)
  Problem: Clinical Measurements: Goal: Respiratory complications will improve Outcome: Progressing Goal: Cardiovascular complication will be avoided Outcome: Progressing   Problem: Pain Management: Goal: General experience of comfort will improve Outcome: Progressing   Problem: Education: Goal: Knowledge of General Education information will improve Description: Including pain rating scale, medication(s)/side effects and non-pharmacologic comfort measures Outcome: Not Progressing   Problem: Clinical Measurements: Goal: Ability to maintain clinical measurements within normal limits will improve Outcome: Not Progressing   Problem: Activity: Goal: Risk for activity intolerance will decrease Outcome: Not Progressing   Problem: Nutrition: Goal: Adequate nutrition will be maintained Outcome: Not Progressing   Problem: Elimination: Goal: Will not experience complications related to bowel motility Outcome: Not Progressing Goal: Will not experience complications related to urinary retention Outcome: Not Progressing   Problem: Skin Integrity: Goal: Risk for impaired skin integrity will decrease Outcome: Not Progressing

## 2023-11-20 NOTE — Progress Notes (Signed)
         Date: 11/20/2023  Patient name: [name] PRITCHARD Sr.  Medical record number: [id number]  Date of birth: [birth year]   This patient has been seen and discussed with the house staff. Please see the resident's note for complete details. I have reviewed the pertinent HPI, Past medical, surgical, family, social history, ROS, allergies, medications, pertinent laboratory and radiographic data and examined the patient independently.  I concur with their findings with the following additions/corrections:  CSF results with TWO WBC  This is NOT c/w meningitis  I will sign off for now  Please call with further questions.   Jomarie Salinas Dam 11/20/2023, 3:08 PM

## 2023-11-20 NOTE — Procedures (Signed)
 PROCEDURE SUMMARY:  Successful lumbar puncture at L2-L3 with opening pressure of 24 mL.  Yielded 13 mL of clear, colorless fluid.  No immediate complications.  Pt tolerated well.   Specimen was sent for labs.  EBL < 5mL  Solmon Selmer Ku PA-C 11/20/2023 11:25 AM

## 2023-11-20 NOTE — Progress Notes (Signed)
 Patient ID: [name], male   DOB: 1960-12-07, 63 y.o.   MRN: [id number] S: pt had lumbar puncture this morning and specimen sent for analysis. O:BP 105/60   Pulse 93   Temp (!) 97.4 F (36.3 C)   Resp 20   Ht 6' 1 (1.854 m)   Wt 102.2 kg   SpO2 93%   BMI 29.73 kg/m   Intake/Output Summary (Last 24 hours) at 11/20/2023 1351 Last data filed at 11/20/2023 1121 Gross per 24 hour  Intake 200 ml  Output --  Net 200 ml   Intake/Output: I/O last 3 completed shifts: In: 0  Out: 1900 [Other:1900]  Intake/Output this shift:  Total I/O In: 200 [I.V.:200] Out: -  Weight change:    Recent Labs  Lab 11/14/23 0332 11/15/23 0316 11/16/23 0402 11/17/23 0245 11/18/23 1515 11/20/23 0814  NA 129* 130* 132* 133* 136 135  K 4.1 3.6 3.9 3.8 4.3 5.1  CL 91* 90* 94* 91* 95* 94*  CO2 20* 22 17* 21* 21* 18*  GLUCOSE 277* 184* 146* 163* 158* 223*  BUN 40* 28* 41* 49* 63* 64*  CREATININE 9.31* 7.77* 9.70* 11.15* 14.18* 13.22*  ALBUMIN  2.7* 3.1* 2.9* 2.9* 2.8* 3.0*  CALCIUM  9.5 9.7 9.8 9.8 9.9 10.2  PHOS 4.8* 4.1 4.6 5.1* 6.1* 6.5*  AST  --   --   --  27  --   --   ALT  --   --   --  23  --   --    Liver Function Tests: Recent Labs  Lab 11/17/23 0245 11/18/23 1515 11/20/23 0814  AST 27  --   --   ALT 23  --   --   ALKPHOS 78  --   --   BILITOT 0.9  --   --   PROT 8.6*  --   --   ALBUMIN  2.9* 2.8* 3.0*   No results for input(s): LIPASE, AMYLASE in the last 168 hours. Recent Labs  Lab 11/16/23 1518  AMMONIA 36*   CBC: Recent Labs  Lab 11/15/23 0316 11/16/23 0402 11/17/23 0245 11/18/23 1515 11/20/23 0815  WBC 6.5 7.9 7.8 7.4 9.8  NEUTROABS  --   --  4.0 3.9  --   HGB 15.7 16.7 16.5 15.8 15.8  HCT 48.1 51.0 50.0 48.1 48.7  MCV 87.5 87.5 87.0 86.7 89.0  PLT 212 273 383 454* 444*   Cardiac Enzymes: No results for input(s): CKTOTAL, CKMB, CKMBINDEX, TROPONINI in the last 168 hours. CBG: Recent Labs  Lab 11/19/23 1152 11/19/23 1558 11/19/23 2109  11/20/23 0544 11/20/23 1117  GLUCAP 173* 174* 223* 229* 209*    Iron  Studies: No results for input(s): IRON , TIBC, TRANSFERRIN, FERRITIN in the last 72 hours. Studies/Results: IR LUMBAR PUNCTURE Result Date: 11/20/2023 CLINICAL DATA:  63 year old male with altered mental status. Lumbar puncture requested. EXAM: LUMBAR PUNCTURE UNDER FLUOROSCOPY PROCEDURE: An appropriate skin entry site was determined fluoroscopically. Operator donned sterile gloves and mask. Skin site was marked, then prepped with Betadine , draped in usual sterile fashion, and infiltrated locally with 1% lidocaine . A 20 gauge spinal needle advanced into the thecal sac at L2-L3 from a left interlaminar approach. Clear colorless CSF spontaneously returned, with opening pressure of 24 cm water. 13 ml CSF were collected and divided among 4 sterile vials for the requested laboratory studies. The needle was then removed. The patient tolerated the procedure well and there were no complications. FLUOROSCOPY: Radiation Exposure Index (as provided  by the fluoroscopic device): 4.0 mGy Kerma IMPRESSION: Technically successful lumbar puncture under fluoroscopy. This exam was performed by Solmon Ku PA-C, and was supervised and interpreted by Ami Bellman, MD. Electronically Signed   By: Ami Bellman D.O.   On: 11/20/2023 13:37   Overnight EEG with video Result Date: 11/19/2023 Shelton Arlin KIDD, MD     11/19/2023 12:11 PM Patient Name: TREVAUN RENDLEMAN Sr. MRN: [id number] Epilepsy Attending: Arlin KIDD Shelton Referring Physician/Provider: Jerrie Lola CROME, MD Duration: 11/18/2023 9155 to 11/19/2023 9155  Patient history: 63yo M with ams getting eeg to evaluate for seizure  Level of alertness: Awake/ lethargic  AEDs during EEG study: None  Technical aspects: This EEG study was done with scalp electrodes positioned according to the 10-20 International system of electrode placement. Electrical activity was reviewed with band pass filter of 1-70Hz ,  sensitivity of 7 uV/mm, display speed of 60mm/sec with a 60Hz  notched filter applied as appropriate. EEG data were recorded continuously and digitally stored.  Video monitoring was available and reviewed as appropriate.  Description: EEG showed continuous generalized 3-5hz  theta-delta slowing. Hyperventilation and photic stimulation were not performed.    ABNORMALITY - Continuous slow, generalized  IMPRESSION: This study is suggestive of moderate diffuse encephalopathy. No seizures or epileptiform discharges were seen throughout the recording.  Priyanka O Yadav    aspirin   81 mg Oral Daily   atorvastatin   40 mg Oral Daily   cinacalcet   30 mg Oral Q breakfast   doxercalciferol   3 mcg Intravenous Q M,W,F-HD   insulin  aspart  0-5 Units Subcutaneous QHS   insulin  aspart  0-9 Units Subcutaneous TID WC   insulin  glargine-yfgn  6 Units Subcutaneous Daily   midodrine   10 mg Oral TID WC   oseltamivir   30 mg Oral Q M,W,F-HD   sevelamer  carbonate  4,000 mg Oral TID WC   thiamine  (VITAMIN B1) injection  100 mg Intravenous Daily   Or   thiamine   100 mg Oral Daily    BMET    Component Value Date/Time   NA 135 11/20/2023 0814   K 5.1 11/20/2023 0814   CL 94 (L) 11/20/2023 0814   CO2 18 (L) 11/20/2023 0814   GLUCOSE 223 (H) 11/20/2023 0814   BUN 64 (H) 11/20/2023 0814   CREATININE 13.22 (H) 11/20/2023 0814   CALCIUM  10.2 11/20/2023 0814   GFRNONAA 4 (L) 11/20/2023 0814   GFRAA 3 (L) 08/14/2019 2051   CBC    Component Value Date/Time   WBC 9.8 11/20/2023 0815   RBC 5.47 11/20/2023 0815   HGB 15.8 11/20/2023 0815   HGB 13.0 12/17/2022 1242   HCT 48.7 11/20/2023 0815   PLT 444 (H) 11/20/2023 0815   PLT 272 12/17/2022 1242   MCV 89.0 11/20/2023 0815   MCH 28.9 11/20/2023 0815   MCHC 32.4 11/20/2023 0815   RDW 14.9 11/20/2023 0815   LYMPHSABS 1.3 11/18/2023 1515   MONOABS 1.7 (H) 11/18/2023 1515   EOSABS 0.3 11/18/2023 1515   BASOSABS 0.1 11/18/2023 1515    Outpt Dialysis Orders:   Center: [facility] 4 hr 15 min 180NRe 450/700 105 kg 2.0 K/ 2.0 Ca UFP 4 AVF - Heparin  7000 units IV Initial bolus Heparin  2000  units IV mid run - Hectorol  7 mcg IV three times per week   Assessment/Plan: 1.AMS: Was on cefepime  which has been stopped. Per primary team some concern for encephalitis/meningitis, was on empiric antibiotics/antivirals until seen by ID on 11/18/23 and all was  stopped.  S/P LP today and await study results.  To have MRI today as well. 2.  ESRD - Typically on MWF schedule. Had serial HD last week 12/24-12/26, and HD last night.  Will plan for HD today to get back on his regular schedule.  3.NSTEMI-work up per cardiology/primary.  4.Hyperkalemia- Resolved with HD.  5.Fever/Hypotension- BC remain no growth so far. Has been started on board spectrum antibiotics/antivirals but now discontinued yesterday.  ID following. 6. HFrEF/history of severe AS-attempt to optimize volume with dialysis. ECHO  EF 30 to 35%. Cards following. Fluid restrictions and daily weights. On midodrine  10 mg 3 times daily 7. Anemia  - Hgb above goal, no ESA indicated at this time.  8. Metabolic bone disease -Phos at goal. Continue renvela . VDRA on hold d/t high calcium .  Fairy RONAL Sellar, MD [facility] [phone]

## 2023-11-20 NOTE — TOC Initial Note (Signed)
 Transition of Care (TOC) - Initial/Assessment Note    Patient Details  Name: [name] CARRICK Sr. MRN: [id number] Date of Birth: [birth year]  Transition of Care [facility]) CM/SW Contact:    Luann SHAUNNA Cumming, LCSW Phone Number: 11/20/2023, 1:56 PM  Clinical Narrative:                  CSW called pt's daughter, Jeoffrey, and discussed SNF recommendation.  Pt lives in [place]. He has been to [facility] about 1 year ago and had a positive experience there. Daughter agreeable to SNF w/u and agreeable to Referrals in [place] and [place] areas. Bed requests have been sent in hub. TOC will follow up with bed offers.    Expected Discharge Plan: Skilled Nursing Facility Barriers to Discharge: SNF Pending bed offer, Continued Medical Work up   Patient Goals and CMS Choice    Daughter agreeable to SNF        Expected Discharge Plan and Services       Living arrangements for the past 2 months: Single Family Home                           HH Arranged:  (Active with [facility])          Prior Living Arrangements/Services Living arrangements for the past 2 months: Single Family Home Lives with:: Self, Siblings Patient language and need for interpreter reviewed:: Yes        Need for Family Participation in Patient Care: Yes (Comment) Care giver support system in place?: Yes (comment) Current home services: DME Criminal Activity/Legal Involvement Pertinent to Current Situation/Hospitalization: No - Comment as needed  Activities of Daily Living   ADL Screening (condition at time of admission) Independently performs ADLs?: No Does the patient have a NEW difficulty with bathing/dressing/toileting/self-feeding that is expected to last >3 days?: Yes (Initiates electronic notice to provider for possible OT consult) Does the patient have a NEW difficulty with getting in/out of bed, walking, or climbing stairs that is expected to last >3 days?: Yes (Initiates electronic notice to provider  for possible PT consult) Does the patient have a NEW difficulty with communication that is expected to last >3 days?: Yes (Initiates electronic notice to provider for possible SLP consult) Is the patient deaf or have difficulty hearing?: Yes Does the patient have difficulty seeing, even when wearing glasses/contacts?: Yes Does the patient have difficulty concentrating, remembering, or making decisions?: Yes  Permission Sought/Granted      Share Information with NAME: Jassiel Flye     Permission granted to share info w Relationship: Brother  Permission granted to share info w Contact Information: [phone]  Emotional Assessment       Orientation: : Oriented to Self Alcohol / Substance Use: Not Applicable Psych Involvement: No (comment)  Admission diagnosis:  NSTEMI (non-ST elevated myocardial infarction) (HCC) [I21.4] Patient Active Problem List   Diagnosis Date Noted   Aortic valve stenosis 11/15/2023   Atrial flutter with rapid ventricular response (HCC) 11/10/2023   NSTEMI (non-ST elevated myocardial infarction) (HCC) 11/08/2023   Hyperglycemia 10/24/2023   Abnormal EKG 10/24/2023   RUQ abdominal pain 10/30/2022   Cholelithiasis 10/29/2022   Hyperbilirubinemia 10/29/2022   Volume overload 10/19/2022   CHF (congestive heart failure) (HCC) 10/18/2022   Hyperosmolar hyperglycemic state (HHS) (HCC)    Diarrhea    Dehiscence of amputation stump of right lower extremity (HCC)    Abscess of right lower leg  Pressure injury of skin 07/02/2022   Cellulitis of right leg 07/01/2022   CAD (coronary artery disease) 05/24/2022   Leukocytosis 05/24/2022   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/23/2022   Obesity (BMI 30-39.9) 02/19/2022   Hypotension 02/08/2022   History of CVA  02/05/2022   Carotid stenosis 02/05/2022   Hyperkalemia 02/03/2022   acute on chronic Anemia of chronic renal failure  02/03/2022   Subacute osteomyelitis, right ankle and foot (HCC)  02/02/2022   Acute metabolic encephalopathy 02/02/2022   Sepsis with encephalopathy and septic shock (HCC) 02/02/2022   Protein-calorie malnutrition, mild (HCC) 02/02/2022   GERD (gastroesophageal reflux disease) 02/02/2022   Acute on chronic systolic CHF (congestive heart failure) (HCC) 08/14/2019   LBBB (left bundle branch block) 08/13/2019   Problem with vascular access 08/13/2019   Dialysis AV fistula malfunction (HCC) 08/12/2019   ESRD on dialysis (HCC) 09/20/2018   DM2 (diabetes mellitus, type 2) (HCC) 09/20/2018   HTN (hypertension) 09/20/2018   PCP:  Freddrick, No Pharmacy:   [facility] - [place], [place] - [address] [place] [address] Phone: [phone] Fax: [phone]  [facility] - [place], [place] - [address] [place] [address] Phone: [phone] Fax: [phone]  [facility] [address] [place] [address] Phone: [phone] Fax: [phone]     Social Drivers of Health (SDOH) Social History: SDOH Screenings   Food Insecurity: Patient Unable To Answer (11/10/2023)  Housing: Patient Unable To Answer (11/10/2023)  Transportation Needs: Unknown (11/10/2023)  Utilities: Patient Unable To Answer (11/10/2023)  Alcohol Screen: Low Risk  (02/04/2022)  Depression (PHQ2-9): Low Risk  (08/06/2022)  Tobacco Use: Low Risk  (11/20/2023)   SDOH Interventions:     Readmission Risk Interventions    11/05/2022    3:08 PM  Readmission Risk Prevention Plan  Transportation Screening Complete  Medication Review (RN Care Manager) Complete  PCP or Specialist appointment within 3-5 days of discharge Complete  HRI or Home Care Consult Complete  SW Recovery Care/Counseling Consult Complete  Palliative Care Screening Complete  Skilled Nursing Facility Complete

## 2023-11-21 DIAGNOSIS — N186 End stage renal disease: Secondary | ICD-10-CM | POA: Diagnosis not present

## 2023-11-21 DIAGNOSIS — I214 Non-ST elevation (NSTEMI) myocardial infarction: Secondary | ICD-10-CM | POA: Diagnosis not present

## 2023-11-21 DIAGNOSIS — G9341 Metabolic encephalopathy: Secondary | ICD-10-CM | POA: Diagnosis not present

## 2023-11-21 DIAGNOSIS — I5023 Acute on chronic systolic (congestive) heart failure: Secondary | ICD-10-CM | POA: Diagnosis not present

## 2023-11-21 LAB — GLUCOSE, CAPILLARY
Glucose-Capillary: 180 mg/dL — ABNORMAL HIGH (ref 70–99)
Glucose-Capillary: 178 mg/dL — ABNORMAL HIGH (ref 70–99)
Glucose-Capillary: 156 mg/dL — ABNORMAL HIGH (ref 70–99)

## 2023-11-21 LAB — VITAMIN B1: Vitamin B1 (Thiamine): 133.8 nmol/L (ref 66.5–200.0)

## 2023-11-21 MED ORDER — SODIUM CHLORIDE 0.9 % IV BOLUS
250.0000 mL | Freq: Once | INTRAVENOUS | Status: AC
  Administered 2023-11-21: 250 mL via INTRAVENOUS

## 2023-11-21 NOTE — Progress Notes (Signed)
 NEUROLOGY CONSULT FOLLOW UP NOTE   Date of service: November 21, 2023 Patient Name: [name] [name]. MRN:  [id number] DOB:  August 15, 1961  Brief Review of HPI  63 year old male with a PMHx of ESRD on dialysis TTS, chronic HFmrEF, insulin -dependent diabetes, hypertension, PVD status post right BKA, history of stroke, hypergammaglobulinemia, hyperferritinemia, recent prior admissions for hyperglycemia with CBG > 600 in the setting of noncompliance with home insulin , followed by a subsequent admission for a fall and recurrent hyperglycemia with blood glucose of 472, QT prolongation, recent T wave inversions on EKG, who re-presented to the ED via EMS on 12/22 for severe hypotension after being called for unremitting coughing with near-syncope and SOB while at dialysis. Troponin was markedly elevated at 7689. Presentation was suspicious for NSTEMI. Cardiology was consulted and he was started on a heparin  drip in conjunction with IVF and ASA.   CT head showed no acute intracranial process, with chronic lacunar infarcts noted.    Overnight on 12/23 he became groggy and somnolent. Follow-up echocardiogram showed reduction in LVEF, now 30 to 35% range with global hypokinesis and basal inferoposterior akinesis. Aortic stenosis was potentially severe. Cardiology is considering cardiac catheterization for evaluation of coronary anatomy and further assessment of aortic stenosis, presuming mental status clears and he is able to consent.   On Wednesday (12/25) he was noted to be significantly worse in terms of cognition. After receiving dialysis overnight he was somewhat improved on Thursday's neurology assessment, being able to open eyes and answer with one-word replies, but was still somnolent.    Was improving on last neurology eval (12/27 remaining awake during the assessment without requiring constant stimulation, following simple commands) but mental status has continued to wax and wane, remaining largely  nonverbal, partially verbal but confused at best, limiting care due such as cardiac craterization as mental status would need to improve to pursue the same.  Additional workup with LTM EEG negative. Repeat MRI Brain with resolution of noted DWI CLOCC lesion.   Interval Hx/subjective   Continues to be encephalopathic.  Vitals   Vitals:   11/21/23 1137 11/21/23 1200 11/21/23 1300 11/21/23 1552  BP: (!) 81/53 (S) (!) 88/40 (S) 98/87 114/60  Pulse: 89   92  Resp: 20   20  Temp: (!) 97.2 F (36.2 C)   98.8 F (37.1 C)  TempSrc: Axillary   Axillary  SpO2: 91%   96%  Weight:      Height:         Body mass index is 28.3 kg/m.  Physical Exam   Physical Exam  HEENT: Normocephalic Lungs: Respirations unlabored Extremities: Warm and well perfused. Right BKA.    Neurological Examination Mental Status: Sleeping.  Opens eyes briefly to voice.  Nonverbal, did give me a thumbs up. Calm today. Cranial Nerves: II: PERRL.  III,IV, VI: Eyes are conjugate.  Intermittent downgaze.  Intermittently looks bilaterally. VII: Grimace is symmetric VIII: Not clearly responding to examiner's voice IX,X: Gag reflex deferred XI: Head is midline Motor/sensory: Improved paratonia.  Grossly moving all 4 extremities equally responsive to noxious stim Has right BKA.  Sensory: Intact to gross touch x 4 Cerebellar: Unable to test today.  On exam 12/27 optic ataxia manifesting as difficulty moving his finger to target bilaterally Gait: Deferred  Medications  Current Facility-Administered Medications:    acetaminophen  (TYLENOL ) tablet 650 mg, 650 mg, Oral, Q6H PRN, 650 mg at 11/13/23 0411 **OR** acetaminophen  (TYLENOL ) suppository 650 mg, 650 mg, Rectal, Q6H PRN,  Arrien, Elidia Sieving, MD, 650 mg at 11/10/23 2045   aspirin  chewable tablet 81 mg, 81 mg, Oral, Daily, Arrien, Elidia Sieving, MD, 81 mg at 11/17/23 9048   atorvastatin  (LIPITOR) tablet 40 mg, 40 mg, Oral, Daily, Arrien, Mauricio Daniel,  MD, 40 mg at 11/17/23 9047   cinacalcet  (SENSIPAR ) tablet 30 mg, 30 mg, Oral, Q breakfast, Arrien, Mauricio Daniel, MD, 30 mg at 11/17/23 9047   doxercalciferol  (HECTOROL ) injection 3 mcg, 3 mcg, Intravenous, Q M,W,F-HD, Skip Lenis, PA-C, 3 mcg at 11/18/23 1556   insulin  aspart (novoLOG ) injection 0-5 Units, 0-5 Units, Subcutaneous, QHS, Bryn Bernardino NOVAK, MD, 2 Units at 11/20/23 2242   insulin  aspart (novoLOG ) injection 0-9 Units, 0-9 Units, Subcutaneous, TID WC, Bryn Bernardino NOVAK, MD, 3 Units at 11/21/23 1735   insulin  glargine-yfgn (SEMGLEE ) injection 6 Units, 6 Units, Subcutaneous, Daily, Briana Elgin LABOR, MD, 6 Units at 11/21/23 9044   midodrine  (PROAMATINE ) tablet 10 mg, 10 mg, Oral, TID WC, Delores Ricka Maidens, NP, 10 mg at 11/17/23 1749   ondansetron  (ZOFRAN ) tablet 4 mg, 4 mg, Oral, Q6H PRN **OR** ondansetron  (ZOFRAN ) injection 4 mg, 4 mg, Intravenous, Q6H PRN, Arrien, Elidia Sieving, MD   Oral care mouth rinse, 15 mL, Mouth Rinse, PRN, Arrien, Elidia Sieving, MD   Oral care mouth rinse, 15 mL, Mouth Rinse, PRN, Sira, Zackery, MD   Oral care mouth rinse, 15 mL, Mouth Rinse, PRN, Sira, Zackery, MD   oseltamivir  (TAMIFLU ) capsule 30 mg, 30 mg, Oral, Q M,W,F-HD, Utomwen, Adesuwa, RPH   sevelamer  carbonate (RENVELA ) tablet 2,400 mg, 2,400 mg, Oral, BID PRN, Arrien, Elidia Sieving, MD   sevelamer  carbonate (RENVELA ) tablet 4,000 mg, 4,000 mg, Oral, TID WC, Arrien, Mauricio Daniel, MD, 4,000 mg at 11/17/23 1254   thiamine  (VITAMIN B1) injection 100 mg, 100 mg, Intravenous, Daily, 100 mg at 11/21/23 0955 **OR** thiamine  (VITAMIN B1) tablet 100 mg, 100 mg, Oral, Daily, [name], MD  Labs and Diagnostic Imaging   CBC:  Recent Labs  Lab 11/17/23 0245 11/18/23 1515 11/20/23 0815  WBC 7.8 7.4 9.8  NEUTROABS 4.0 3.9  --   HGB 16.5 15.8 15.8  HCT 50.0 48.1 48.7  MCV 87.0 86.7 89.0  PLT 383 454* 444*    Basic Metabolic Panel:  Lab Results  Component Value Date   NA 135  11/20/2023   K 5.1 11/20/2023   CO2 18 (L) 11/20/2023   GLUCOSE 223 (H) 11/20/2023   BUN 64 (H) 11/20/2023   CREATININE 13.22 (H) 11/20/2023   CALCIUM  10.2 11/20/2023   GFRNONAA 4 (L) 11/20/2023   GFRAA 3 (L) 08/14/2019   Lipid Panel:  Lab Results  Component Value Date   LDLCALC 39 11/13/2023   HgbA1c:  Lab Results  Component Value Date   HGBA1C >15.5 (H) 10/24/2023   Urine Drug Screen: No results found for: LABOPIA, COCAINSCRNUR, LABBENZ, AMPHETMU, THCU, LABBARB  Alcohol Level     Component Value Date/Time   ETH <10 02/02/2022 0152   INR  Lab Results  Component Value Date   INR 1.2 11/08/2023   APTT  Lab Results  Component Value Date   APTT 28 11/08/2023    Assessment  63 year old male with a PMHx of ESRD on dialysis TTS, chronic HFmrEF, insulin -dependent diabetes, hypertension, PVD status post right BKA, history of stroke, hypergammaglobulinemia, hyperferritinemia, recent prior admissions for hyperglycemia with CBG > 600 in the setting of noncompliance with home insulin , followed by a subsequent admission for a fall and recurrent hyperglycemia  with blood glucose of 472, QT prolongation, recent T wave inversions on EKG, who re-presented to the ED via EMS on 12/22 for severe hypotension after being called for unremitting coughing with near-syncope and SOB while at dialysis. Troponin was markedly elevated at 7689. Presentation was suspicious for NSTEMI. Cardiology was consulted and he was started on a heparin  drip in conjunction with IVF and ASA (heparin  discontinued 12/31).   - Exam at best with neuro 12/27 -- a drowsy patient who follows all simple commands and can verbalize some correct answers to orientation questions. Significantly increased latencies of  motor and verbal responses. No seizure like activity or nuchal rigidity. Significant fluctuations, fairly limited exam for me today as above (non verbal, not following commands) - Imaging: - CT head: No  acute intracranial process, with chronic lacunar infarcts noted.  - MRI brain (personally reviewed): Symmetrical restricted diffusion straddling the midline within the splenium of the corpus callosum, with subtle corresponding FLAIR hyperintensity. 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 Various etiologies can lead to this appearance including seizures, metabolic disturbance (including severe hyperglycemia which this patient has had multiple times), many medications, infection, infarct (less likely given minimal FLAIR changes). No other acute intracranial abnormality. Stable atrophy and white matter disease, likely reflecting the sequela of chronic microvascular ischemia. Remote lacunar infarcts of the basal ganglia and thalami bilaterally. Left mastoid effusion.  - EEG: 12/27 Continuous slow, generalized. This study is suggestive of mild to moderate diffuse encephalopathy.  12/30 generalzied slowing, no seizures. 1/2: moderate diffuse encephalopathy. No seizures - Fever curve gradually improved daily 12/24, 12/24, last fever 12/26; however Flu A positive 12/30 and now on Tamiflu   - Due to suspected NSTEMI, Cardiology was considering cardiac catheterization for evaluation of coronary anatomy and further assessment of aortic stenosis, however could not pursue as mental status not clearing / unable to consent  - DDx: - Initially presentation was felt to be most consistent with a septic encephalopathy. Moderate rather than high suspicion for meningitis due to no nuchal rigidity, LP however, not consistent with meningitis - Regarding the corpus callosum DWI lesion seen on MRI, the DDx for this is broad in the imaging literature, including PRES, toxic insults, metabolic insults and certain medications and infarct. Usually (but not always) these lesions are reversible if they are symmetric and without mass effect, as is the case with the current MRI.  Corpus  callosum lesions can be associated with significant encephalopathy as well  . RECOMMENDATIONS  - will follow up pending LP results. However, I suspect this is likely just prolonged delirium in the setting of toxic metabolic encephalopathy and improvement is likely going to be gradual. - follow up with neurology outpatient. Okay to discharge from a neuro standpoint.  ______________________________________________________________________   Marquel Pottenger [facility]

## 2023-11-21 NOTE — Progress Notes (Signed)
 Received patient from night HD nurse,non verbal response,restless in bed.   Access used : Left AVF that worked well.   Hemo comment: Not tolerating UF during the first 3 hours of treatment,was able to pull 500 cc during the last 30 min.  Hand off to the patient's nurse : Back into his room with stable medical condition via transporter.

## 2023-11-21 NOTE — Progress Notes (Addendum)
 PROGRESS NOTE    [name] [name]  FMW:[id number] DOB: 09/16/1961 DOA: 11/08/2023 PCP: Pcp, No   Brief Narrative: [name] [name] is a 63 y.o. male with medical history significant of ESRD on HD, (MWF) heart failure, T2DM, sp right BKA and obesity who presented after experiencing a near syncope during hemodialysis. Recent hospitalization 12/06 to 12/10 for uncontrolled hyperglycemia. Patient was having HD on 12/22 when he was noted to have chest pain and hypotension, along with a near syncope episode. Apparently treatment was stopped and EMS was called. He wss found hypotensive and was transported to the ED. His blood pressure was initially low in the ED 75/59, but improved to 106/72. He was found to have very elevated high sensitive troponin and cardiology was consulted for evaluation.  He was diagnosed with NSTEMI and plan to admit patient to [facility] under cardiology. Pt was transferred to [facility] for further evaluation.    Assessment and Plan:  Acute metabolic encephalopathy Initial concern for encephalitis vs meningitis. Neurology consulted. LP not obtained secondary to heparin  infusion. Patient started empirically on Vancomycin / Cefepime / Flagyl , transitioned to Vancomycin  and Zosyn  as Cefepime  discontinued for fear of possibly contributing to mental status change. Zosyn  transition to Ceftriaxone , ampicillin  and Acyclovir  for CNS coverage. EEG obtained on 11/13/23 and 1/1 significant for no evidence of seizure activity. MRI brain (1/3) without acute abnormality. CSF labs significant for elevated glucose and protein, not consistent with meningitis; meningitis/encephalitis panel negative for pathogen. -Follow-up neurology/ID recommendations  Fever In setting of metabolic encephalopathy. Concern for CNS etiology of infection and patient treated as mentioned above. Patient also found to have an influenza infection. Fever curve has improved with resolution of fevers.  NSTEMI Cardiology  consulted. Troponin elevation with peak of 8,942. Patient managed medically on Heparin  IV, aspirin  and Lipitor. Transthoracic Echocardiogram significant for an LVEF of 30-35% with regional wall motion abnormalities, in addition to mildly reduced RV function and moderate-severe aortic stenosis. Cardiology signed off on 1/2 with recommendations for re-consult once patient's mental status has improved.  ESRD on HD Nephrology on board for hemodialysis needs.  Acute on chronic diastolic heart failure Fluid management via hemodialysis.  Diarrhea C. Difficile negative. Improved. Possibly antibiotic related.  Diabetes mellitus type 2 Uncontrolled with hyperglycemia; last hemoglobin A1C of >15.5%. -Continue Semglee  and SSI  History of CVA -Continue ASA, and Lipitor  Occult positive stool Unclear etiology. Hemoglobin is currently stable. -Will consider GI consultation once patient is more stable or if he develops progressive anemia. Will hold chemical DVT prophylaxis   FEN/GI Patient with poor oral intake secondary to mental status. Seen by speech therapy. Currently on a full liquid diet. Discussed with family. Unsuccessful at introducing oral intake. -Will likely insert small bore feeding tube today pending discussion with daughter (unable to reach initially)   DVT prophylaxis: SCDs (left leg) Code Status:   Code Status: Full Code Family Communication: None at bedside. Attempted to reach daughter but unsuccessful Disposition Plan: Discharge pending ongoing workup/management   Consultants:    Procedures:    Antimicrobials: Vancomycin  Zosyn  Oseltaivir Flagyl  Ceftriaxone  Cefepime  Acyclovir  Ampicillin     Subjective: Not answering my questions  Objective: BP (S) 98/87   Pulse 89   Temp (!) 97.2 F (36.2 C) (Axillary)   Resp 20   Ht 6' 1 (1.854 m)   Wt 97.3 kg   SpO2 91%   BMI 28.30 kg/m   Examination:  General exam: Appears calm and comfortable Respiratory  system: Clear to  auscultation. Respiratory effort normal. Cardiovascular system: S1 & S2 heard, RRR. No murmurs. Gastrointestinal system: Abdomen is nondistended, soft and nontender. Normal bowel sounds heard. Central nervous system: Asleep but arouses to me calling his name and maintained eye contact before closing his eyes again and not responding to further questions   Data Reviewed: I have personally reviewed following labs and imaging studies  CBC Lab Results  Component Value Date   WBC 9.8 11/20/2023   RBC 5.47 11/20/2023   HGB 15.8 11/20/2023   HCT 48.7 11/20/2023   MCV 89.0 11/20/2023   MCH 28.9 11/20/2023   PLT 444 (H) 11/20/2023   MCHC 32.4 11/20/2023   RDW 14.9 11/20/2023   LYMPHSABS 1.3 11/18/2023   MONOABS 1.7 (H) 11/18/2023   EOSABS 0.3 11/18/2023   BASOSABS 0.1 11/18/2023     Last metabolic panel Lab Results  Component Value Date   NA 135 11/20/2023   K 5.1 11/20/2023   CL 94 (L) 11/20/2023   CO2 18 (L) 11/20/2023   BUN 64 (H) 11/20/2023   CREATININE 13.22 (H) 11/20/2023   GLUCOSE 223 (H) 11/20/2023   GFRNONAA 4 (L) 11/20/2023   GFRAA 3 (L) 08/14/2019   CALCIUM  10.2 11/20/2023   PHOS 6.5 (H) 11/20/2023   PROT 8.6 (H) 11/17/2023   ALBUMIN  3.0 (L) 11/20/2023   LABGLOB 5.1 (H) 12/17/2022   AGRATIO 0.7 11/20/2022   BILITOT 0.9 11/17/2023   ALKPHOS 78 11/17/2023   AST 27 11/17/2023   ALT 23 11/17/2023   ANIONGAP 23 (H) 11/20/2023    GFR: Estimated Creatinine Clearance: 7.2 mL/min (A) (by C-G formula based on SCr of 13.22 mg/dL (H)).  Recent Results (from the past 240 hours)  C Difficile Quick Screen w PCR reflex     Status: None   Collection Time: 11/12/23  8:37 AM   Specimen: STOOL  Result Value Ref Range Status   C Diff antigen NEGATIVE NEGATIVE Final   C Diff toxin NEGATIVE NEGATIVE Final   C Diff interpretation No C. difficile detected.  Final    Comment: Performed at [facility], [address], [place], [place] [address]   Respiratory (~20 pathogens) panel by PCR     Status: Abnormal   Collection Time: 11/16/23  2:32 PM   Specimen: Nasopharyngeal Swab; Respiratory  Result Value Ref Range Status   Adenovirus NOT DETECTED NOT DETECTED Final   Coronavirus 229E NOT DETECTED NOT DETECTED Final    Comment: (NOTE) The Coronavirus on the Respiratory Panel, DOES NOT test for the novel  Coronavirus (2019 nCoV)    Coronavirus HKU1 NOT DETECTED NOT DETECTED Final   Coronavirus NL63 NOT DETECTED NOT DETECTED Final   Coronavirus OC43 NOT DETECTED NOT DETECTED Final   Metapneumovirus NOT DETECTED NOT DETECTED Final   Rhinovirus / Enterovirus NOT DETECTED NOT DETECTED Final   Influenza A H1 2009 DETECTED (A) NOT DETECTED Final   Influenza B NOT DETECTED NOT DETECTED Final   Parainfluenza Virus 1 NOT DETECTED NOT DETECTED Final   Parainfluenza Virus 2 NOT DETECTED NOT DETECTED Final   Parainfluenza Virus 3 NOT DETECTED NOT DETECTED Final   Parainfluenza Virus 4 NOT DETECTED NOT DETECTED Final   Respiratory Syncytial Virus NOT DETECTED NOT DETECTED Final   Bordetella pertussis NOT DETECTED NOT DETECTED Final   Bordetella Parapertussis NOT DETECTED NOT DETECTED Final   Chlamydophila pneumoniae NOT DETECTED NOT DETECTED Final   Mycoplasma pneumoniae NOT DETECTED NOT DETECTED Final    Comment: Performed at [facility], [address] [place], [place] [address]  SARS Coronavirus 2 by RT PCR (hospital order, performed in [facility] hospital lab) *cepheid single result test* Anterior Nasal Swab     Status: None   Collection Time: 11/16/23  2:32 PM   Specimen: Anterior Nasal Swab  Result Value Ref Range Status   SARS Coronavirus 2 by RT PCR NEGATIVE NEGATIVE Final    Comment: Performed at [facility], [name], [place], [place] [address]  CSF culture w Gram Stain     Status: None (Preliminary result)   Collection Time: 11/19/23 11:34 AM   Specimen: CSF; Cerebrospinal Fluid  Result Value Ref Range  Status   Specimen Description CSF  Final   Special Requests NONE  Final   Gram Stain   Final    WBC PRESENT, PREDOMINANTLY MONONUCLEAR NO ORGANISMS SEEN CYTOSPIN SMEAR    Culture   Final    NO GROWTH 1 DAY Performed at [facility], [address] [place], [place] [address]    Report Status PENDING  Incomplete      Radiology Studies: MR BRAIN WO CONTRAST Result Date: 11/20/2023 CLINICAL DATA:  Mental status change, unknown cause EXAM: MRI HEAD WITHOUT CONTRAST TECHNIQUE: Multiplanar, multiecho pulse sequences of the brain and surrounding structures were obtained without intravenous contrast. COMPARISON:  11/18/2023 CT head FINDINGS: Brain: No acute infarction, hemorrhage, hydrocephalus, extra-axial collection or mass lesion. Vascular: Normal flow voids. Skull and upper cervical spine: Normal marrow signal. Sinuses/Orbits: Mostly clear sinuses.  No acute findings. IMPRESSION: Normal brain MRI. No acute abnormality. Electronically Signed   By: Gilmore GORMAN Molt M.D.   On: 11/20/2023 19:28   IR LUMBAR PUNCTURE Result Date: 11/20/2023 CLINICAL DATA:  63 year old male with altered mental status. Lumbar puncture requested. EXAM: LUMBAR PUNCTURE UNDER FLUOROSCOPY PROCEDURE: An appropriate skin entry site was determined fluoroscopically. Operator donned sterile gloves and mask. Skin site was marked, then prepped with Betadine , draped in usual sterile fashion, and infiltrated locally with 1% lidocaine . A 20 gauge spinal needle advanced into the thecal sac at L2-L3 from a left interlaminar approach. Clear colorless CSF spontaneously returned, with opening pressure of 24 cm water. 13 ml CSF were collected and divided among 4 sterile vials for the requested laboratory studies. The needle was then removed. The patient tolerated the procedure well and there were no complications. FLUOROSCOPY: Radiation Exposure Index (as provided by the fluoroscopic device): 4.0 mGy Kerma IMPRESSION: Technically  successful lumbar puncture under fluoroscopy. This exam was performed by Solmon Ku PA-C, and was supervised and interpreted by Ami Bellman, MD. Electronically Signed   By: Ami Bellman D.O.   On: 11/20/2023 13:37      LOS: 13 days    Elgin Lam, MD [facility] 11/21/2023, 1:10 PM   If 7PM-7AM, please contact night-coverage [email or web address]

## 2023-11-21 NOTE — Procedures (Signed)
 I was present at this dialysis session. I have reviewed the session itself and made appropriate changes.   Vital signs in last 24 hours:  Temp:  [97.4 F (36.3 C)-98.6 F (37 C)] 98.4 F (36.9 C) (01/04 0757) Pulse Rate:  [77-96] 89 (01/04 0636) Resp:  [12-32] 21 (01/04 0636) BP: (72-144)/(29-98) 144/77 (01/04 0740) SpO2:  [87 %-100 %] 94 % (01/04 0636) Weight:  [97.3 kg-97.7 kg] 97.3 kg (01/04 0812) Weight change:  Filed Weights   11/19/23 0612 11/21/23 0019 11/21/23 0812  Weight: 102.2 kg 97.7 kg 97.3 kg    Recent Labs  Lab 11/20/23 0814  NA 135  K 5.1  CL 94*  CO2 18*  GLUCOSE 223*  BUN 64*  CREATININE 13.22*  CALCIUM  10.2  PHOS 6.5*    Recent Labs  Lab 11/17/23 0245 11/18/23 1515 11/20/23 0815  WBC 7.8 7.4 9.8  NEUTROABS 4.0 3.9  --   HGB 16.5 15.8 15.8  HCT 50.0 48.1 48.7  MCV 87.0 86.7 89.0  PLT 383 454* 444*    Scheduled Meds:  aspirin   81 mg Oral Daily   atorvastatin   40 mg Oral Daily   cinacalcet   30 mg Oral Q breakfast   doxercalciferol   3 mcg Intravenous Q M,W,F-HD   insulin  aspart  0-5 Units Subcutaneous QHS   insulin  aspart  0-9 Units Subcutaneous TID WC   insulin  glargine-yfgn  6 Units Subcutaneous Daily   midodrine   10 mg Oral TID WC   oseltamivir   30 mg Oral Q M,W,F-HD   sevelamer  carbonate  4,000 mg Oral TID WC   thiamine  (VITAMIN B1) injection  100 mg Intravenous Daily   Or   thiamine   100 mg Oral Daily   Continuous Infusions: PRN Meds:.acetaminophen  **OR** acetaminophen , heparin , lidocaine  (PF), lidocaine -prilocaine , ondansetron  **OR** ondansetron  (ZOFRAN ) IV, mouth rinse, mouth rinse, mouth rinse, pentafluoroprop-tetrafluoroeth, sevelamer  carbonate   Kimela Malstrom A Caylor Tallarico,  MD 11/21/2023, 8:30 AM

## 2023-11-22 ENCOUNTER — Encounter (HOSPITAL_COMMUNITY): Payer: Self-pay | Admitting: Radiology

## 2023-11-22 DIAGNOSIS — N186 End stage renal disease: Secondary | ICD-10-CM | POA: Diagnosis not present

## 2023-11-22 DIAGNOSIS — G9341 Metabolic encephalopathy: Secondary | ICD-10-CM | POA: Diagnosis not present

## 2023-11-22 DIAGNOSIS — I5023 Acute on chronic systolic (congestive) heart failure: Secondary | ICD-10-CM | POA: Diagnosis not present

## 2023-11-22 DIAGNOSIS — I214 Non-ST elevation (NSTEMI) myocardial infarction: Secondary | ICD-10-CM | POA: Diagnosis not present

## 2023-11-22 LAB — RENAL FUNCTION PANEL
Albumin: 3.3 g/dL — ABNORMAL LOW (ref 3.5–5.0)
Anion gap: 22 — ABNORMAL HIGH (ref 5–15)
BUN: 61 mg/dL — ABNORMAL HIGH (ref 8–23)
CO2: 21 mmol/L — ABNORMAL LOW (ref 22–32)
Calcium: 10.4 mg/dL — ABNORMAL HIGH (ref 8.9–10.3)
Chloride: 92 mmol/L — ABNORMAL LOW (ref 98–111)
Creatinine, Ser: 11.72 mg/dL — ABNORMAL HIGH (ref 0.61–1.24)
GFR, Estimated: 4 mL/min — ABNORMAL LOW (ref >60)
Glucose, Bld: 200 mg/dL — ABNORMAL HIGH (ref 70–99)
Phosphorus: 7.2 mg/dL — ABNORMAL HIGH (ref 2.5–4.6)
Potassium: 4.4 mmol/L (ref 3.5–5.1)
Sodium: 135 mmol/L (ref 135–145)

## 2023-11-22 LAB — CBC
HCT: 49.9 % (ref 39.0–52.0)
Hemoglobin: 16.2 g/dL (ref 13.0–17.0)
MCH: 28.9 pg (ref 26.0–34.0)
MCHC: 32.5 g/dL (ref 30.0–36.0)
MCV: 88.9 fL (ref 80.0–100.0)
Platelets: 367 10*3/uL (ref 150–400)
RBC: 5.61 MIL/uL (ref 4.22–5.81)
RDW: 16.7 % — ABNORMAL HIGH (ref 11.5–15.5)
WBC: 9.5 10*3/uL (ref 4.0–10.5)
nRBC: 0.5 % — ABNORMAL HIGH (ref 0.0–0.2)

## 2023-11-22 LAB — GLUCOSE, CAPILLARY
Glucose-Capillary: 162 mg/dL — ABNORMAL HIGH (ref 70–99)
Glucose-Capillary: 185 mg/dL — ABNORMAL HIGH (ref 70–99)
Glucose-Capillary: 182 mg/dL — ABNORMAL HIGH (ref 70–99)
Glucose-Capillary: 250 mg/dL — ABNORMAL HIGH (ref 70–99)
Glucose-Capillary: 254 mg/dL — ABNORMAL HIGH (ref 70–99)

## 2023-11-22 MED ORDER — HEPARIN SODIUM (PORCINE) 1000 UNIT/ML DIALYSIS: 1000.0000 [IU] | INTRAMUSCULAR | Status: DC | PRN

## 2023-11-22 MED ORDER — ANTICOAGULANT SODIUM CITRATE 4% (200MG/5ML) IV SOLN
5.0000 mL | Status: DC | PRN
Start: 2023-11-22 — End: 2023-11-23

## 2023-11-22 MED ORDER — LIDOCAINE HCL (PF) 1 % IJ SOLN: 5.0000 mL | INTRAMUSCULAR | Status: DC | PRN

## 2023-11-22 MED ORDER — LIDOCAINE-PRILOCAINE 2.5-2.5 % EX CREA: 1.0000 | TOPICAL_CREAM | CUTANEOUS | Status: DC | PRN

## 2023-11-22 MED ORDER — PENTAFLUOROPROP-TETRAFLUOROETH EX AERO: 1.0000 | INHALATION_SPRAY | CUTANEOUS | Status: DC | PRN

## 2023-11-22 MED ORDER — ALTEPLASE 2 MG IJ SOLR: 2.0000 mg | Freq: Once | INTRAMUSCULAR | Status: DC | PRN

## 2023-11-22 NOTE — Plan of Care (Addendum)
 Pt had two BP last night that were soft, but patient is more alert and oriented, talking and asymptomatic. MD notified and repeat BP was 97/67 (76). MD had RN administered midodrine  dose early. Stat CBC and Lactic acid ordered. Dialysis called for report. Weight increased, no extra linen or equipment on bed. Continue to monitor.   Problem: Education: Goal: Knowledge of General Education information will improve Description: Including pain rating scale, medication(s)/side effects and non-pharmacologic comfort measures Outcome: Not Progressing   Problem: Health Behavior/Discharge Planning: Goal: Ability to manage health-related needs will improve Outcome: Not Progressing   Problem: Clinical Measurements: Goal: Ability to maintain clinical measurements within normal limits will improve Outcome: Not Progressing Goal: Will remain free from infection Outcome: Not Progressing Goal: Diagnostic test results will improve Outcome: Not Progressing Goal: Respiratory complications will improve Outcome: Not Progressing Goal: Cardiovascular complication will be avoided Outcome: Not Progressing   Problem: Activity: Goal: Risk for activity intolerance will decrease Outcome: Not Progressing   Problem: Nutrition: Goal: Adequate nutrition will be maintained Outcome: Not Progressing   Problem: Coping: Goal: Level of anxiety will decrease Outcome: Not Progressing   Problem: Elimination: Goal: Will not experience complications related to bowel motility Outcome: Not Progressing Goal: Will not experience complications related to urinary retention Outcome: Not Progressing   Problem: Pain Management: Goal: General experience of comfort will improve Outcome: Not Progressing   Problem: Safety: Goal: Ability to remain free from injury will improve Outcome: Not Progressing   Problem: Skin Integrity: Goal: Risk for impaired skin integrity will decrease Outcome: Not Progressing   Problem:  Education: Goal: Ability to describe self-care measures that may prevent or decrease complications (Diabetes Survival Skills Education) will improve Outcome: Not Progressing Goal: Individualized Educational Video(s) Outcome: Not Progressing   Problem: Coping: Goal: Ability to adjust to condition or change in health will improve Outcome: Not Progressing   Problem: Fluid Volume: Goal: Ability to maintain a balanced intake and output will improve Outcome: Not Progressing   Problem: Health Behavior/Discharge Planning: Goal: Ability to identify and utilize available resources and services will improve Outcome: Not Progressing Goal: Ability to manage health-related needs will improve Outcome: Not Progressing   Problem: Metabolic: Goal: Ability to maintain appropriate glucose levels will improve Outcome: Not Progressing   Problem: Nutritional: Goal: Maintenance of adequate nutrition will improve Outcome: Not Progressing Goal: Progress toward achieving an optimal weight will improve Outcome: Not Progressing   Problem: Skin Integrity: Goal: Risk for impaired skin integrity will decrease Outcome: Not Progressing   Problem: Tissue Perfusion: Goal: Adequacy of tissue perfusion will improve Outcome: Not Progressing   Problem: Safety: Goal: Non-violent Restraint(s) Outcome: Not Progressing

## 2023-11-22 NOTE — Progress Notes (Signed)
 Patient ID: [name], male   DOB: 06-18-1961, 63 y.o.   MRN: [id number] S: More awake and alert this morning.  Follows simple commands but remains nonverbal. O:BP 130/70 (BP Location: Right Arm)   Pulse 93   Temp 97.9 F (36.6 C) (Axillary)   Resp 18   Ht 6' 1 (1.854 m)   Wt 87.2 kg   SpO2 95%   BMI 25.36 kg/m   Intake/Output Summary (Last 24 hours) at 11/22/2023 9072 Last data filed at 11/21/2023 1813 Gross per 24 hour  Intake 0 ml  Output --  Net 0 ml   Intake/Output: I/O last 3 completed shifts: In: 0  Out: 500 [Other:500]  Intake/Output this shift:  No intake/output data recorded. Weight change: -0.4 kg Gen: NAD CVS: RRR Resp:CTA Abd: +BS, soft, NT/ND Ext: s/p RBKA, no edema, LUE AVF +T/B  Recent Labs  Lab 11/16/23 0402 11/17/23 0245 11/18/23 1515 11/20/23 0814  NA 132* 133* 136 135  K 3.9 3.8 4.3 5.1  CL 94* 91* 95* 94*  CO2 17* 21* 21* 18*  GLUCOSE 146* 163* 158* 223*  BUN 41* 49* 63* 64*  CREATININE 9.70* 11.15* 14.18* 13.22*  ALBUMIN  2.9* 2.9* 2.8* 3.0*  CALCIUM  9.8 9.8 9.9 10.2  PHOS 4.6 5.1* 6.1* 6.5*  AST  --  27  --   --   ALT  --  23  --   --    Liver Function Tests: Recent Labs  Lab 11/17/23 0245 11/18/23 1515 11/20/23 0814  AST 27  --   --   ALT 23  --   --   ALKPHOS 78  --   --   BILITOT 0.9  --   --   PROT 8.6*  --   --   ALBUMIN  2.9* 2.8* 3.0*   No results for input(s): LIPASE, AMYLASE in the last 168 hours. Recent Labs  Lab 11/16/23 1518  AMMONIA 36*   CBC: Recent Labs  Lab 11/16/23 0402 11/17/23 0245 11/18/23 1515 11/20/23 0815  WBC 7.9 7.8 7.4 9.8  NEUTROABS  --  4.0 3.9  --   HGB 16.7 16.5 15.8 15.8  HCT 51.0 50.0 48.1 48.7  MCV 87.5 87.0 86.7 89.0  PLT 273 383 454* 444*   Cardiac Enzymes: No results for input(s): CKTOTAL, CKMB, CKMBINDEX, TROPONINI in the last 168 hours. CBG: Recent Labs  Lab 11/20/23 2220 11/21/23 1138 11/21/23 1601 11/21/23 2105 11/22/23 0559  GLUCAP 239* 180* 178*  156* 162*    Iron  Studies: No results for input(s): IRON , TIBC, TRANSFERRIN, FERRITIN in the last 72 hours. Studies/Results: MR BRAIN WO CONTRAST Result Date: 11/20/2023 CLINICAL DATA:  Mental status change, unknown cause EXAM: MRI HEAD WITHOUT CONTRAST TECHNIQUE: Multiplanar, multiecho pulse sequences of the brain and surrounding structures were obtained without intravenous contrast. COMPARISON:  11/18/2023 CT head FINDINGS: Brain: No acute infarction, hemorrhage, hydrocephalus, extra-axial collection or mass lesion. Vascular: Normal flow voids. Skull and upper cervical spine: Normal marrow signal. Sinuses/Orbits: Mostly clear sinuses.  No acute findings. IMPRESSION: Normal brain MRI. No acute abnormality. Electronically Signed   By: Gilmore GORMAN Molt M.D.   On: 11/20/2023 19:28   IR LUMBAR PUNCTURE Result Date: 11/20/2023 CLINICAL DATA:  63 year old male with altered mental status. Lumbar puncture requested. EXAM: LUMBAR PUNCTURE UNDER FLUOROSCOPY PROCEDURE: An appropriate skin entry site was determined fluoroscopically. Operator donned sterile gloves and mask. Skin site was marked, then prepped with Betadine , draped in usual sterile fashion, and infiltrated locally with  1% lidocaine . A 20 gauge spinal needle advanced into the thecal sac at L2-L3 from a left interlaminar approach. Clear colorless CSF spontaneously returned, with opening pressure of 24 cm water. 13 ml CSF were collected and divided among 4 sterile vials for the requested laboratory studies. The needle was then removed. The patient tolerated the procedure well and there were no complications. FLUOROSCOPY: Radiation Exposure Index (as provided by the fluoroscopic device): 4.0 mGy Kerma IMPRESSION: Technically successful lumbar puncture under fluoroscopy. This exam was performed by Solmon Ku PA-C, and was supervised and interpreted by Ami Bellman, MD. Electronically Signed   By: Ami Bellman D.O.   On: 11/20/2023 13:37     aspirin   81 mg Oral Daily   atorvastatin   40 mg Oral Daily   cinacalcet   30 mg Oral Q breakfast   doxercalciferol   3 mcg Intravenous Q M,W,F-HD   insulin  aspart  0-5 Units Subcutaneous QHS   insulin  aspart  0-9 Units Subcutaneous TID WC   insulin  glargine-yfgn  6 Units Subcutaneous Daily   midodrine   10 mg Oral TID WC   oseltamivir   30 mg Oral Q M,W,F-HD   sevelamer  carbonate  4,000 mg Oral TID WC   thiamine  (VITAMIN B1) injection  100 mg Intravenous Daily   Or   thiamine   100 mg Oral Daily    BMET    Component Value Date/Time   NA 135 11/20/2023 0814   K 5.1 11/20/2023 0814   CL 94 (L) 11/20/2023 0814   CO2 18 (L) 11/20/2023 0814   GLUCOSE 223 (H) 11/20/2023 0814   BUN 64 (H) 11/20/2023 0814   CREATININE 13.22 (H) 11/20/2023 0814   CALCIUM  10.2 11/20/2023 0814   GFRNONAA 4 (L) 11/20/2023 0814   GFRAA 3 (L) 08/14/2019 2051   CBC    Component Value Date/Time   WBC 9.8 11/20/2023 0815   RBC 5.47 11/20/2023 0815   HGB 15.8 11/20/2023 0815   HGB 13.0 12/17/2022 1242   HCT 48.7 11/20/2023 0815   PLT 444 (H) 11/20/2023 0815   PLT 272 12/17/2022 1242   MCV 89.0 11/20/2023 0815   MCH 28.9 11/20/2023 0815   MCHC 32.4 11/20/2023 0815   RDW 14.9 11/20/2023 0815   LYMPHSABS 1.3 11/18/2023 1515   MONOABS 1.7 (H) 11/18/2023 1515   EOSABS 0.3 11/18/2023 1515   BASOSABS 0.1 11/18/2023 1515    Outpt Dialysis Orders:  Center: [facility] 4 hr 15 min 180NRe 450/700 105 kg 2.0 K/ 2.0 Ca UFP 4 AVF - Heparin  7000 units IV Initial bolus Heparin  2000  units IV mid run - Hectorol  7 mcg IV three times per week   Assessment/Plan: 1.AMS: Was on cefepime  which has been stopped. Per primary team some concern for encephalitis/meningitis, was on empiric antibiotics/antivirals until seen by ID on 11/18/23 and all was stopped.  S/P LP which ruled out meningitis/encephalitis.  MRI without acute abnormality.  ID stopped abx and antivirals and has signed off. 2.  ESRD - Typically on  MWF schedule. Had serial HD last week 12/24-12/26, and HD last night.  Will plan for HD tomorrow to get back on his regular schedule.  3.NSTEMI-work up per cardiology/primary.  4.Hyperkalemia- Resolved with HD.  5.Fever/Hypotension- BC remain no growth so far. Has been started on board spectrum antibiotics/antivirals but now discontinued yesterday.  ID signed off. 6. HFrEF/history of severe AS-attempt to optimize volume with dialysis. ECHO  EF 30 to 35%. Cards following. Fluid restrictions and daily weights. On midodrine  10  mg 3 times daily 7. Anemia  - Hgb above goal, no ESA indicated at this time.  8. Metabolic bone disease -Phos at goal. Continue renvela . VDRA on hold d/t high calcium .    Fairy RONAL Sellar, MD [facility] [phone]

## 2023-11-22 NOTE — Progress Notes (Signed)
 PROGRESS NOTE    [name] [name]  FMW:[id number] DOB: 1961/03/16 DOA: 11/08/2023 PCP: Pcp, No   Brief Narrative: [name] [name] is a 63 y.o. male with medical history significant of ESRD on HD, (MWF) heart failure, T2DM, sp right BKA and obesity who presented after experiencing a near syncope during hemodialysis. Recent hospitalization 12/06 to 12/10 for uncontrolled hyperglycemia. Patient was having HD on 12/22 when he was noted to have chest pain and hypotension, along with a near syncope episode. Apparently treatment was stopped and EMS was called. He wss found hypotensive and was transported to the ED. His blood pressure was initially low in the ED 75/59, but improved to 106/72. He was found to have very elevated high sensitive troponin and cardiology was consulted for evaluation.  He was diagnosed with NSTEMI and plan to admit patient to [facility] under cardiology. Pt was transferred to [facility] for further evaluation.    Assessment and Plan:  Acute metabolic encephalopathy Initial concern for encephalitis vs meningitis. Neurology consulted. LP not obtained secondary to heparin  infusion. Patient started empirically on Vancomycin / Cefepime / Flagyl , transitioned to Vancomycin  and Zosyn  as Cefepime  discontinued for fear of possibly contributing to mental status change. Zosyn  transition to Ceftriaxone , ampicillin  and Acyclovir  for CNS coverage. EEG obtained on 11/13/23 and 1/1 significant for no evidence of seizure activity. MRI brain (1/3) without acute abnormality. CSF labs significant for elevated glucose and protein, not consistent with meningitis; meningitis/encephalitis panel negative for pathogen. Neurology recommendation for continued supportive care with anticipation that patient will have a prolonged recovery of encephalopathy related to hyperglycemia. ID and neurology signed off.  Fever In setting of metabolic encephalopathy. Concern for CNS etiology of infection and patient treated  as mentioned above. Patient also found to have an influenza infection. Fever curve has improved with resolution of fevers.  NSTEMI Cardiology consulted. Troponin elevation with peak of 8,942. Patient managed medically on Heparin  IV, aspirin  and Lipitor. Transthoracic Echocardiogram significant for an LVEF of 30-35% with regional wall motion abnormalities, in addition to mildly reduced RV function and moderate-severe aortic stenosis. Cardiology signed off on 1/2 with recommendations for re-consult once patient's mental status has improved.  ESRD on HD Nephrology on board for hemodialysis needs.  Acute on chronic diastolic heart failure Fluid management via hemodialysis.  Diarrhea C. Difficile negative. Improved. Possibly antibiotic related.  Diabetes mellitus type 2 Uncontrolled with hyperglycemia; last hemoglobin A1C of >15.5%. -Continue Semglee  and SSI  History of CVA -Continue ASA, and Lipitor  Occult positive stool Unclear etiology. Hemoglobin is currently stable. -Will consider GI consultation once patient is more stable or if he develops progressive anemia. Will hold chemical DVT prophylaxis   FEN/GI Patient with poor oral intake secondary to mental status. Seen by speech therapy. Currently on a full liquid diet. Discussed with family. Unsuccessful at introducing oral intake. -Small bore feeding tube if oral intake does not improve -Dietitian consult   DVT prophylaxis: SCDs (left leg) Code Status:   Code Status: Full Code Family Communication: None at bedside. Daughter on telephone Disposition Plan: Discharge pending ongoing workup/management   Consultants:  Nephrology Infectious disease Neurology  Procedures:    Antimicrobials: Vancomycin  Zosyn  Oseltaivir Flagyl  Ceftriaxone  Cefepime  Acyclovir  Ampicillin     Subjective: Sleeping but arouses easily. Nods his head to some questions asked.   Objective: BP 130/70 (BP Location: Right Arm)   Pulse 93   Temp  97.9 F (36.6 C) (Axillary)   Resp 18   Ht 6' 1 (1.854 m)  Wt 87.2 kg   SpO2 95%   BMI 25.36 kg/m   Examination:  General exam: Appears calm and comfortable Respiratory system: Clear to auscultation. Respiratory effort normal. Cardiovascular system: S1 & S2 heard, RRR. No murmurs. Gastrointestinal system: Abdomen is nondistended, soft and nontender. Normal bowel sounds heard. Central nervous system: Alert. Not following commands. Musculoskeletal: No edema. No calf tenderness   Data Reviewed: I have personally reviewed following labs and imaging studies  CBC Lab Results  Component Value Date   WBC 9.5 11/22/2023   RBC 5.61 11/22/2023   HGB 16.2 11/22/2023   HCT 49.9 11/22/2023   MCV 88.9 11/22/2023   MCH 28.9 11/22/2023   PLT 367 11/22/2023   MCHC 32.5 11/22/2023   RDW 16.7 (H) 11/22/2023   LYMPHSABS 1.3 11/18/2023   MONOABS 1.7 (H) 11/18/2023   EOSABS 0.3 11/18/2023   BASOSABS 0.1 11/18/2023     Last metabolic panel Lab Results  Component Value Date   NA 135 11/22/2023   K 4.4 11/22/2023   CL 92 (L) 11/22/2023   CO2 21 (L) 11/22/2023   BUN 61 (H) 11/22/2023   CREATININE 11.72 (H) 11/22/2023   GLUCOSE 200 (H) 11/22/2023   GFRNONAA 4 (L) 11/22/2023   GFRAA 3 (L) 08/14/2019   CALCIUM  10.4 (H) 11/22/2023   PHOS 7.2 (H) 11/22/2023   PROT 8.6 (H) 11/17/2023   ALBUMIN  3.3 (L) 11/22/2023   LABGLOB 5.1 (H) 12/17/2022   AGRATIO 0.7 11/20/2022   BILITOT 0.9 11/17/2023   ALKPHOS 78 11/17/2023   AST 27 11/17/2023   ALT 23 11/17/2023   ANIONGAP 22 (H) 11/22/2023    GFR: Estimated Creatinine Clearance: 7.5 mL/min (A) (by C-G formula based on SCr of 11.72 mg/dL (H)).  Recent Results (from the past 240 hours)  Respiratory (~20 pathogens) panel by PCR     Status: Abnormal   Collection Time: 11/16/23  2:32 PM   Specimen: Nasopharyngeal Swab; Respiratory  Result Value Ref Range Status   Adenovirus NOT DETECTED NOT DETECTED Final   Coronavirus 229E NOT DETECTED  NOT DETECTED Final    Comment: (NOTE) The Coronavirus on the Respiratory Panel, DOES NOT test for the novel  Coronavirus (2019 nCoV)    Coronavirus HKU1 NOT DETECTED NOT DETECTED Final   Coronavirus NL63 NOT DETECTED NOT DETECTED Final   Coronavirus OC43 NOT DETECTED NOT DETECTED Final   Metapneumovirus NOT DETECTED NOT DETECTED Final   Rhinovirus / Enterovirus NOT DETECTED NOT DETECTED Final   Influenza A H1 2009 DETECTED (A) NOT DETECTED Final   Influenza B NOT DETECTED NOT DETECTED Final   Parainfluenza Virus 1 NOT DETECTED NOT DETECTED Final   Parainfluenza Virus 2 NOT DETECTED NOT DETECTED Final   Parainfluenza Virus 3 NOT DETECTED NOT DETECTED Final   Parainfluenza Virus 4 NOT DETECTED NOT DETECTED Final   Respiratory Syncytial Virus NOT DETECTED NOT DETECTED Final   Bordetella pertussis NOT DETECTED NOT DETECTED Final   Bordetella Parapertussis NOT DETECTED NOT DETECTED Final   Chlamydophila pneumoniae NOT DETECTED NOT DETECTED Final   Mycoplasma pneumoniae NOT DETECTED NOT DETECTED Final    Comment: Performed at [facility], [address] [place], [place] [address]  SARS Coronavirus 2 by RT PCR (hospital order, performed in [facility] hospital lab) *cepheid single result test* Anterior Nasal Swab     Status: None   Collection Time: 11/16/23  2:32 PM   Specimen: Anterior Nasal Swab  Result Value Ref Range Status   SARS Coronavirus 2  by RT PCR NEGATIVE NEGATIVE Final    Comment: Performed at [facility], [address], [place], [place] [address]  CSF culture w Gram Stain     Status: None (Preliminary result)   Collection Time: 11/19/23 11:34 AM   Specimen: CSF; Cerebrospinal Fluid  Result Value Ref Range Status   Specimen Description CSF  Final   Special Requests NONE  Final   Gram Stain   Final    WBC PRESENT, PREDOMINANTLY MONONUCLEAR NO ORGANISMS SEEN CYTOSPIN SMEAR    Culture   Final    NO GROWTH 2 DAYS Performed at [facility], [address], [place], [place] [address]    Report Status PENDING  Incomplete      Radiology Studies: MR BRAIN WO CONTRAST Result Date: 11/20/2023 CLINICAL DATA:  Mental status change, unknown cause EXAM: MRI HEAD WITHOUT CONTRAST TECHNIQUE: Multiplanar, multiecho pulse sequences of the brain and surrounding structures were obtained without intravenous contrast. COMPARISON:  11/18/2023 CT head FINDINGS: Brain: No acute infarction, hemorrhage, hydrocephalus, extra-axial collection or mass lesion. Vascular: Normal flow voids. Skull and upper cervical spine: Normal marrow signal. Sinuses/Orbits: Mostly clear sinuses.  No acute findings. IMPRESSION: Normal brain MRI. No acute abnormality. Electronically Signed   By: Gilmore GORMAN Molt M.D.   On: 11/20/2023 19:28      LOS: 14 days    Elgin Lam, MD [facility] 11/22/2023, 11:44 AM   If 7PM-7AM, please contact night-coverage [email or web address]

## 2023-11-22 NOTE — Progress Notes (Signed)
 Pt was starting to communicate better, slowly started talking with words rather than gestures.  Pt was eating as well, strawberry ice cream seems to be his favorite and diet shasta, pt was talking in phrases and eventually in sentences.  Pt was able to have a conversation with his daughter on the phone.  Pt earlier today was not able to do any of this.  MD is aware and came up to see him, a little later on.  Will continue to monitor, Thanks, Garrel FALCON RN.

## 2023-11-23 DIAGNOSIS — G9341 Metabolic encephalopathy: Secondary | ICD-10-CM | POA: Diagnosis not present

## 2023-11-23 DIAGNOSIS — N186 End stage renal disease: Secondary | ICD-10-CM | POA: Diagnosis not present

## 2023-11-23 DIAGNOSIS — I5023 Acute on chronic systolic (congestive) heart failure: Secondary | ICD-10-CM | POA: Diagnosis not present

## 2023-11-23 DIAGNOSIS — I214 Non-ST elevation (NSTEMI) myocardial infarction: Secondary | ICD-10-CM | POA: Diagnosis not present

## 2023-11-23 LAB — RENAL FUNCTION PANEL
Albumin: 3.1 g/dL — ABNORMAL LOW (ref 3.5–5.0)
Anion gap: 23 — ABNORMAL HIGH (ref 5–15)
BUN: 78 mg/dL — ABNORMAL HIGH (ref 8–23)
CO2: 21 mmol/L — ABNORMAL LOW (ref 22–32)
Calcium: 10.1 mg/dL (ref 8.9–10.3)
Chloride: 89 mmol/L — ABNORMAL LOW (ref 98–111)
Creatinine, Ser: 13.17 mg/dL — ABNORMAL HIGH (ref 0.61–1.24)
GFR, Estimated: 4 mL/min — ABNORMAL LOW (ref >60)
Glucose, Bld: 276 mg/dL — ABNORMAL HIGH (ref 70–99)
Phosphorus: 8 mg/dL — ABNORMAL HIGH (ref 2.5–4.6)
Potassium: 4.6 mmol/L (ref 3.5–5.1)
Sodium: 133 mmol/L — ABNORMAL LOW (ref 135–145)

## 2023-11-23 LAB — CSF CULTURE W GRAM STAIN

## 2023-11-23 LAB — GLUCOSE, CAPILLARY
Glucose-Capillary: 248 mg/dL — ABNORMAL HIGH (ref 70–99)
Glucose-Capillary: 175 mg/dL — ABNORMAL HIGH (ref 70–99)
Glucose-Capillary: 228 mg/dL — ABNORMAL HIGH (ref 70–99)
Glucose-Capillary: 281 mg/dL — ABNORMAL HIGH (ref 70–99)

## 2023-11-23 LAB — CBC
HCT: 43.7 % (ref 39.0–52.0)
Hemoglobin: 14.2 g/dL (ref 13.0–17.0)
MCH: 28.9 pg (ref 26.0–34.0)
MCHC: 32.5 g/dL (ref 30.0–36.0)
MCV: 89 fL (ref 80.0–100.0)
Platelets: 400 10*3/uL (ref 150–400)
RBC: 4.91 MIL/uL (ref 4.22–5.81)
RDW: 16.3 % — ABNORMAL HIGH (ref 11.5–15.5)
WBC: 9 10*3/uL (ref 4.0–10.5)
nRBC: 0.3 % — ABNORMAL HIGH (ref 0.0–0.2)

## 2023-11-23 LAB — IGG CSF INDEX
Albumin CSF-mCnc: 52 mg/dL (ref 15–55)
Albumin: 4 g/dL (ref 3.9–4.9)
CSF IgG Index: 0.4 (ref 0.0–0.7)
IgG (Immunoglobin G), Serum: 2349 mg/dL — ABNORMAL HIGH (ref 603–1613)
IgG, CSF: 12 mg/dL — ABNORMAL HIGH (ref 0.0–10.3)
IgG/Alb Ratio, CSF: 0.23 (ref 0.00–0.25)

## 2023-11-23 LAB — LACTIC ACID, PLASMA: Lactic Acid, Venous: 1.4 mmol/L (ref 0.5–1.9)

## 2023-11-23 MED ORDER — ENSURE ENLIVE PO LIQD
237.0000 mL | Freq: Two times a day (BID) | ORAL | Status: DC
  Administered 2023-11-23 – 2023-12-15 (×23): 237 mL via ORAL

## 2023-11-23 MED ORDER — RENA-VITE PO TABS
1.0000 | ORAL_TABLET | Freq: Every day | ORAL | Status: DC
  Administered 2023-11-23 – 2023-12-14 (×21): 1 via ORAL
  Filled 2023-11-23 (×22): qty 1

## 2023-11-23 NOTE — Procedures (Addendum)
 Received patient in bed to unit.  Alert and oriented.  Informed consent signed and in chart.   TX duration: 2 hours and 40 min  Patient BP dropped multiple times, vomited twice, had to turn UF off.  Transported back to the room  Alert, without acute distress.  Hand-off given to patient's nurse.   Access used: lavf Access issues: none  Total UF removed: 0 Medication(s) given: midodrine , hecterol   Greig Silvan, RN [facility]

## 2023-11-23 NOTE — Progress Notes (Signed)
 Patients blood pressure continues to drop between 70/80's. MD schertz notified and advised to continue to saline bolus patient to keep SBP>100 and keep patient out of UF. Patient is not symptomatic. Will continue to monitor.

## 2023-11-23 NOTE — Progress Notes (Signed)
 Physical Therapy Treatment Patient Details Name: [name] TADROS Sr. MRN: [id number] DOB: Oct 12, 1961 Today's Date: 11/23/2023   History of Present Illness Pt is 63 year old presented to [facility] on  11/08/23 after experiencing a near syncope during HD. Pt being worked up for an NSTEMI with planned LHC and RHC possibly 11/16/23, and fever with acute metabolic encephalopathy.  Pt recently admitted 12/6-12/10/24 for a fall. PMH - htn, cardiomyopathy, CHF, dm, esrd on hd, R BKA, CVA.    PT Comments  Patient resting in bed and alert, pt more alert and able to communicate this visit. Pt facial expression of small smile at start of session and voicing agreeable to therapy. Pt answered simple questions with extra time to process. Pt remains sensitive to touch of Lt UE greatest distally at hand stating I'm paralyzed referencing his Lt UE. With encouragement pt able to move UE minimally and on sitting EOB able to position hand next to self for bil UE support. Pt with poor trunk control at EOB requiring Max assist to support sitting balance, great difficulty maintaining head upright. After ~ 5 minutes at EOB pt belching and progressed to significant bout of emesis. Max/Total assist to support balance during episode and on conclusion total assist to return to supine with Max assist for rolling to change linens and pt gown. EOS offered beverage to swish and rinse mouth and emesis bag provided to spit into, pt shooting liquid out and above bag instead and assist provided to clean up. Overall today Max/Total Assist +2 for all mobility, much improved cognition/alertness though remains impaired, and VSS throughout. Pt repositioned for comfort in bed and RN at bedside at EOS. Will continue to progress as able.    If plan is discharge home, recommend the following: Two people to help with walking and/or transfers;Two people to help with bathing/dressing/bathroom;Assistance with cooking/housework;Assistance with feeding;Direct  supervision/assist for medications management;Direct supervision/assist for financial management;Assist for transportation;Help with stairs or ramp for entrance;Supervision due to cognitive status   Can travel by private vehicle        Equipment Recommendations       Recommendations for Other Services       Precautions / Restrictions Precautions Precautions: Fall Required Braces or Orthoses: Other Brace Other Brace: R prosthetic (not sure if fitting) Restrictions Weight Bearing Restrictions Per Provider Order: No Other Position/Activity Restrictions: R BKA     Mobility  Bed Mobility Overal bed mobility: Needs Assistance Bed Mobility: Rolling, Sidelying to Sit, Sit to Sidelying Rolling: Mod assist, Max assist, +2 for physical assistance, +2 for safety/equipment, Used rails Sidelying to sit: +2 for physical assistance, +2 for safety/equipment, Used rails, HOB elevated, Max assist     Sit to sidelying: Max assist, +2 for safety/equipment, +2 for physical assistance General bed mobility comments: Max/Total +2 assist for rolling and side<>sit EOB. pt required Max/Total assist for seated balance at EOB including to hold head upright.    Transfers                   General transfer comment: unsafe to attempt    Ambulation/Gait                   Stairs             Wheelchair Mobility     Tilt Bed    Modified Rankin (Stroke Patients Only)       Balance Overall balance assessment: Needs assistance Sitting-balance support: Feet supported, Bilateral upper extremity supported (  Lt foot) Sitting balance-Leahy Scale: Zero Sitting balance - Comments: Max-Total Assist (balance worsened with bout of emesis) Postural control: Left lateral lean (anterior lean)                                  Cognition Arousal: Alert Behavior During Therapy: Flat affect Overall Cognitive Status: Impaired/Different from baseline Area of Impairment:  Orientation, Attention, Memory, Following commands, Safety/judgement, Awareness, Problem solving                 Orientation Level: Disoriented to, Situation (did not fully assess situational, pt stating they said it's monday) Current Attention Level: Focused Memory: Decreased short-term memory Following Commands: Follows one step commands inconsistently Safety/Judgement: Decreased awareness of safety Awareness: Intellectual Problem Solving: Slow processing, Decreased initiation, Requires verbal cues, Difficulty sequencing          Exercises      General Comments        Pertinent Vitals/Pain Pain Assessment Pain Assessment: Faces Faces Pain Scale: Hurts even more Pain Location: Lt UE distal>proximal Pain Descriptors / Indicators: Grimacing, Guarding, Moaning Pain Intervention(s): Limited activity within patient's tolerance, Monitored during session, Repositioned    Home Living                          Prior Function            PT Goals (current goals can now be found in the care plan section) Acute Rehab PT Goals Patient Stated Goal: unable to state PT Goal Formulation: Patient unable to participate in goal setting Time For Goal Achievement: 11/28/23 Potential to Achieve Goals: Fair Progress towards PT goals: Progressing toward goals    Frequency    Min 1X/week      PT Plan      Co-evaluation              AM-PAC PT 6 Clicks Mobility   Outcome Measure  Help needed turning from your back to your side while in a flat bed without using bedrails?: Total Help needed moving from lying on your back to sitting on the side of a flat bed without using bedrails?: Total Help needed moving to and from a bed to a chair (including a wheelchair)?: Total Help needed standing up from a chair using your arms (e.g., wheelchair or bedside chair)?: Total Help needed to walk in hospital room?: Total Help needed climbing 3-5 steps with a railing? :  Total 6 Click Score: 6    End of Session   Activity Tolerance: Other (comment);Patient limited by fatigue (emesis at EOB) Patient left: in bed;with call bell/phone within reach;with nursing/sitter in room Nurse Communication: Need for lift equipment;Mobility status PT Visit Diagnosis: Muscle weakness (generalized) (M62.81);Difficulty in walking, not elsewhere classified (R26.2);Pain Pain - Right/Left: Left Pain - part of body: Hand     Time: 8661-8591 PT Time Calculation (min) (ACUTE ONLY): 30 min  Charges:    $Therapeutic Activity: 23-37 mins PT General Charges $$ ACUTE PT VISIT: 1 Visit                     Vernell DONEEN KLEIN, DPT Acute Rehabilitation Services Office [phone]  11/23/23 2:56 PM

## 2023-11-23 NOTE — Anesthesia Postprocedure Evaluation (Signed)
 Anesthesia Post Note  Patient: [name] PICOTTE Sr.  Procedure(s) Performed: MRI WITH ANESTHESIA     Patient location during evaluation: PACU Anesthesia Type: MAC Level of consciousness: awake Pain management: pain level controlled Vital Signs Assessment: post-procedure vital signs reviewed and stable Respiratory status: spontaneous breathing, nonlabored ventilation and respiratory function stable Cardiovascular status: stable and blood pressure returned to baseline Anesthetic complications: no   No notable events documented.  Last Vitals:  Vitals:   11/23/23 0521 11/23/23 0559  BP: (!) 82/56 97/67  Pulse: 82   Resp: 19 18  Temp: (!) 36.4 C   SpO2: 97%     Last Pain:  Vitals:   11/23/23 0521  TempSrc: Axillary  PainSc: 0-No pain                 Debby FORBES Like

## 2023-11-23 NOTE — Plan of Care (Signed)

## 2023-11-23 NOTE — Progress Notes (Signed)
 Initial Nutrition Assessment  DOCUMENTATION CODES:   Not applicable  INTERVENTION:  Ensure Enlive BID Renal MVI Magic Cup TID  NUTRITION DIAGNOSIS:  Inadequate oral intake related to acute illness as evidenced by other (comment), energy intake < 75% for > 7 days (full liquid diet).  GOAL:  Patient will meet greater than or equal to 90% of their needs  MONITOR:  PO intake, Supplement acceptance, Diet advancement  REASON FOR ASSESSMENT:  Consult Poor PO, Assessment of nutrition requirement/status  ASSESSMENT:   63y.o. male with PMH: ESRD on HD, HF, T2DM, s/p RBKA and obesity. Experienced syncope/SOB during HD. Recently hospitalized from 12/06-12/10 for uncontrolled hyperglycemia. Found to have very high troponin. Dx of NSTEMI.   12/22 Ad[facility] to [facility] 12/23 Echocardiogram, transferred to [facility] 12/25 Cognition declined 12/27 EEG 01/03 Lumbar puncture d/t worsening cognition  Unable to see patient today as he was HD for most of the morning and working with therapy upon second attempt. Noted to have excessive emesis during timr of second attempt. Averaging 13% intake x8 meals documented. Today he showed 100% completion of lunch meal.  Admission Weight: 112.6kg Current Weight: 95.3kg  Has shown 15% wt loss since admission, however this is attributable to receiving regular hemodialysis and fluid removal. Per neph note, pt does have extensive hx of noncompliance with outpatient txs and struggles with IDWG.  Did discuss Cortrak with MD, who stated patient's mentation and PO intake have improved. Family's preference is to not place TF unless mentation declines again. Will continue to monitor intake, but will initiate Ensure Enlive to supplement.    Net IO Since Admission: -8,813.99 mL [11/23/23 1429]   Labs: Na 133 CBGs 158-276 x5 days CRP 1.6 A1c >15.5 Troponin 7,672<--7,282<--8,942  Meds: cinacalcet , doxercalciferol , SSI, semglee , sevelamer  carbonate, thiamine ,    NUTRITION - FOCUSED PHYSICAL EXAM:  Unable to complete NFPE. Will re-attempt at a later date.  Diet Order:   Diet Order             Diet full liquid Room service appropriate? Yes; Fluid consistency: Thin  Diet effective now             EDUCATION NEEDS:   Not appropriate for education at this time  Skin:  Skin Assessment: Reviewed RN Assessment - previously documented with wound to perineum. Per WOC note on 01/02, no wound found. Foam dressing applied to sacrum to prevent breakdown.  Last BM:  01/06  Height:  Ht Readings from Last 1 Encounters:  11/08/23 6' 1 (1.854 m)    Weight:  Wt Readings from Last 1 Encounters:  11/23/23 95.3 kg    Ideal Body Weight:  83.6 kg  BMI:  Body mass index is 27.72 kg/m.  Estimated Nutritional Needs:   Kcal:  2000-2200kcal  Protein:  100-110g protein  Fluid:  1.5-2L  Blair Deaner MS, RD, LDN Registered Dietitian Clinical Nutrition RD Inpatient Contact Info in Amion

## 2023-11-23 NOTE — Progress Notes (Signed)
 PROGRESS NOTE    [name] [name]  FMW:[id number] DOB: 11-28-1960 DOA: 11/08/2023 PCP: Pcp, No   Brief Narrative: [name] [name] is a 63 y.o. male with medical history significant of ESRD on HD, (MWF) heart failure, T2DM, sp right BKA and obesity who presented after experiencing a near syncope during hemodialysis. Recent hospitalization 12/06 to 12/10 for uncontrolled hyperglycemia. Patient was having HD on 12/22 when he was noted to have chest pain and hypotension, along with a near syncope episode. Apparently treatment was stopped and EMS was called. He wss found hypotensive and was transported to the ED. His blood pressure was initially low in the ED 75/59, but improved to 106/72. He was found to have very elevated high sensitive troponin and cardiology was consulted for evaluation.  He was diagnosed with NSTEMI and plan to admit patient to [facility] under cardiology. Pt was transferred to [facility] for further evaluation.    Assessment and Plan:  Acute metabolic encephalopathy Initial concern for encephalitis vs meningitis. Neurology consulted. LP not obtained secondary to heparin  infusion. Patient started empirically on Vancomycin / Cefepime / Flagyl , transitioned to Vancomycin  and Zosyn  as Cefepime  discontinued for fear of possibly contributing to mental status change. Zosyn  transition to Ceftriaxone , ampicillin  and Acyclovir  for CNS coverage. EEG obtained on 11/13/23 and 1/1 significant for no evidence of seizure activity. MRI brain (1/3) without acute abnormality. CSF labs significant for elevated glucose and protein, not consistent with meningitis; meningitis/encephalitis panel negative for pathogen. Neurology recommendation for continued supportive care with anticipation that patient will have a prolonged recovery of encephalopathy related to hyperglycemia. ID and neurology signed off.  Fever In setting of metabolic encephalopathy. Concern for CNS etiology of infection and patient treated  as mentioned above. Patient also found to have an influenza infection. Fever curve has improved with resolution of fevers.  NSTEMI Cardiology consulted. Troponin elevation with peak of 8,942. Patient managed medically on Heparin  IV, aspirin  and Lipitor. Transthoracic Echocardiogram significant for an LVEF of 30-35% with regional wall motion abnormalities, in addition to mildly reduced RV function and moderate-severe aortic stenosis. Cardiology signed off on 1/2 with recommendations for re-consult once patient's mental status has improved. -Will consult cardiology in AM if patient's mental status remains stable  ESRD on HD Nephrology on board for hemodialysis needs.  Acute on chronic diastolic heart failure Fluid management via hemodialysis.  Diarrhea C. Difficile negative. Improved. Possibly antibiotic related.  Diabetes mellitus type 2 Uncontrolled with hyperglycemia; last hemoglobin A1C of >15.5%. -Continue Semglee  and SSI  History of CVA -Continue ASA, and Lipitor  Occult positive stool Unclear etiology. Hemoglobin is currently stable. Patient with recurrent bloody stools. -GI consultation in AM, but likely will need outpatient follow-up  FEN/GI Improved oral intake with improvement of mental status. Will avoid ng tube at this time.   DVT prophylaxis: SCDs (left leg) Code Status:   Code Status: Full Code Family Communication: None at bedside. Daughter on telephone (1/5) Disposition Plan: Discharge pending ongoing workup/management   Consultants:  Nephrology Infectious disease Neurology  Procedures:    Antimicrobials: Vancomycin  Zosyn  Oseltaivir Flagyl  Ceftriaxone  Cefepime  Acyclovir  Ampicillin     Subjective: No concerns today  Objective: BP (!) 92/54   Pulse 86   Temp 98.5 F (36.9 C)   Resp 16   Ht 6' 1 (1.854 m)   Wt 95.3 kg   SpO2 92%   BMI 27.72 kg/m   Examination:  General exam: Appears calm and comfortable Respiratory system: Clear to  auscultation. Respiratory effort  normal. Cardiovascular system: S1 & S2 heard, RRR. No murmurs. Gastrointestinal system: Abdomen is nondistended, soft and nontender. Normal bowel sounds heard. Central nervous system: Alert.   Data Reviewed: I have personally reviewed following labs and imaging studies  CBC Lab Results  Component Value Date   WBC 9.0 11/23/2023   RBC 4.91 11/23/2023   HGB 14.2 11/23/2023   HCT 43.7 11/23/2023   MCV 89.0 11/23/2023   MCH 28.9 11/23/2023   PLT 400 11/23/2023   MCHC 32.5 11/23/2023   RDW 16.3 (H) 11/23/2023   LYMPHSABS 1.3 11/18/2023   MONOABS 1.7 (H) 11/18/2023   EOSABS 0.3 11/18/2023   BASOSABS 0.1 11/18/2023     Last metabolic panel Lab Results  Component Value Date   NA 133 (L) 11/23/2023   K 4.6 11/23/2023   CL 89 (L) 11/23/2023   CO2 21 (L) 11/23/2023   BUN 78 (H) 11/23/2023   CREATININE 13.17 (H) 11/23/2023   GLUCOSE 276 (H) 11/23/2023   GFRNONAA 4 (L) 11/23/2023   GFRAA 3 (L) 08/14/2019   CALCIUM  10.1 11/23/2023   PHOS 8.0 (H) 11/23/2023   PROT 8.6 (H) 11/17/2023   ALBUMIN  3.1 (L) 11/23/2023   LABGLOB 5.1 (H) 12/17/2022   AGRATIO 0.7 11/20/2022   BILITOT 0.9 11/17/2023   ALKPHOS 78 11/17/2023   AST 27 11/17/2023   ALT 23 11/17/2023   ANIONGAP 23 (H) 11/23/2023    GFR: Estimated Creatinine Clearance: 6.7 mL/min (A) (by C-G formula based on SCr of 13.17 mg/dL (H)).  Recent Results (from the past 240 hours)  Respiratory (~20 pathogens) panel by PCR     Status: Abnormal   Collection Time: 11/16/23  2:32 PM   Specimen: Nasopharyngeal Swab; Respiratory  Result Value Ref Range Status   Adenovirus NOT DETECTED NOT DETECTED Final   Coronavirus 229E NOT DETECTED NOT DETECTED Final    Comment: (NOTE) The Coronavirus on the Respiratory Panel, DOES NOT test for the novel  Coronavirus (2019 nCoV)    Coronavirus HKU1 NOT DETECTED NOT DETECTED Final   Coronavirus NL63 NOT DETECTED NOT DETECTED Final   Coronavirus OC43 NOT  DETECTED NOT DETECTED Final   Metapneumovirus NOT DETECTED NOT DETECTED Final   Rhinovirus / Enterovirus NOT DETECTED NOT DETECTED Final   Influenza A H1 2009 DETECTED (A) NOT DETECTED Final   Influenza B NOT DETECTED NOT DETECTED Final   Parainfluenza Virus 1 NOT DETECTED NOT DETECTED Final   Parainfluenza Virus 2 NOT DETECTED NOT DETECTED Final   Parainfluenza Virus 3 NOT DETECTED NOT DETECTED Final   Parainfluenza Virus 4 NOT DETECTED NOT DETECTED Final   Respiratory Syncytial Virus NOT DETECTED NOT DETECTED Final   Bordetella pertussis NOT DETECTED NOT DETECTED Final   Bordetella Parapertussis NOT DETECTED NOT DETECTED Final   Chlamydophila pneumoniae NOT DETECTED NOT DETECTED Final   Mycoplasma pneumoniae NOT DETECTED NOT DETECTED Final    Comment: Performed at [facility], [address], [place], [place] [address]  SARS Coronavirus 2 by RT PCR (hospital order, performed in [facility] hospital lab) *cepheid single result test* Anterior Nasal Swab     Status: None   Collection Time: 11/16/23  2:32 PM   Specimen: Anterior Nasal Swab  Result Value Ref Range Status   SARS Coronavirus 2 by RT PCR NEGATIVE NEGATIVE Final    Comment: Performed at [facility], [address], [place], [place] [address]  CSF culture w Gram Stain     Status: None (Preliminary result)  Collection Time: 11/19/23 11:34 AM   Specimen: CSF; Cerebrospinal Fluid  Result Value Ref Range Status   Specimen Description CSF  Final   Special Requests NONE  Final   Gram Stain   Final    WBC PRESENT, PREDOMINANTLY MONONUCLEAR NO ORGANISMS SEEN CYTOSPIN SMEAR    Culture   Final    NO GROWTH 2 DAYS Performed at [facility], [address] [place], [place] [address]    Report Status PENDING  Incomplete      Radiology Studies: No results found.     LOS: 15 days    Elgin Lam, MD [facility] 11/23/2023, 11:42 AM   If 7PM-7AM, please contact night-coverage [email or web address]

## 2023-11-23 NOTE — Progress Notes (Signed)
 SLP Cancellation Note  Patient Details Name: CHEIKH BRAMBLE Sr. MRN: [id number] DOB: 11-14-1961   Cancelled treatment:       Reason Eval/Treat Not Completed: Patient at procedure or test/unavailable (HD). Will f/u as able.     Leita SAILOR., M.A. CCC-SLP Acute Rehabilitation Services Office [phone]  Secure chat preferred  11/23/2023, 12:01 PM

## 2023-11-23 NOTE — Progress Notes (Signed)
 Patient ID: [name] [name], male   DOB: 1961-11-11, 64 y.o.   MRN: [id number] S: Seen in HD unit.  Follows simple commands but remains nonverbal.  O:BP (!) 97/56   Pulse 85   Temp 98.6 F (37 C)   Resp 13   Ht 6' 1 (1.854 m)   Wt 95.3 kg   SpO2 97%   BMI 27.72 kg/m   Intake/Output Summary (Last 24 hours) at 11/23/2023 1533 Last data filed at 11/23/2023 1335 Gross per 24 hour  Intake 60 ml  Output 0 ml  Net 60 ml   Intake/Output: No intake/output data recorded.  Intake/Output this shift:  Total I/O In: 60 [P.O.:60] Out: 0  Weight change: -0.9 kg Gen: NAD CVS: RRR Resp:CTA Abd: +BS, soft, NT/ND Ext: s/p R BKA, no edema, LUE AVF +T/B  Recent Labs  Lab 11/17/23 0245 11/18/23 1515 11/20/23 0814 11/22/23 1044 11/23/23 0840  NA 133* 136 135 135 133*  K 3.8 4.3 5.1 4.4 4.6  CL 91* 95* 94* 92* 89*  CO2 21* 21* 18* 21* 21*  GLUCOSE 163* 158* 223* 200* 276*  BUN 49* 63* 64* 61* 78*  CREATININE 11.15* 14.18* 13.22* 11.72* 13.17*  ALBUMIN  2.9* 2.8* 3.0* 3.3* 3.1*  CALCIUM  9.8 9.9 10.2 10.4* 10.1  PHOS 5.1* 6.1* 6.5* 7.2* 8.0*  AST 27  --   --   --   --   ALT 23  --   --   --   --    Liver Function Tests: Recent Labs  Lab 11/17/23 0245 11/18/23 1515 11/20/23 0814 11/22/23 1044 11/23/23 0840  AST 27  --   --   --   --   ALT 23  --   --   --   --   ALKPHOS 78  --   --   --   --   BILITOT 0.9  --   --   --   --   PROT 8.6*  --   --   --   --   ALBUMIN  2.9*   < > 3.0* 3.3* 3.1*   < > = values in this interval not displayed.   No results for input(s): LIPASE, AMYLASE in the last 168 hours. No results for input(s): AMMONIA in the last 168 hours.  CBC: Recent Labs  Lab 11/17/23 0245 11/18/23 1515 11/20/23 0815 11/22/23 1044 11/23/23 0840  WBC 7.8 7.4 9.8 9.5 9.0  NEUTROABS 4.0 3.9  --   --   --   HGB 16.5 15.8 15.8 16.2 14.2  HCT 50.0 48.1 48.7 49.9 43.7  MCV 87.0 86.7 89.0 88.9 89.0  PLT 383 454* 444* 367 400   Cardiac Enzymes: No results for  input(s): CKTOTAL, CKMB, CKMBINDEX, TROPONINI in the last 168 hours. CBG: Recent Labs  Lab 11/22/23 1157 11/22/23 1523 11/22/23 2202 11/23/23 0807 11/23/23 1246  GLUCAP 182* 250* 254* 248* 175*    Iron  Studies: No results for input(s): IRON , TIBC, TRANSFERRIN, FERRITIN in the last 72 hours. Studies/Results: No results found.   aspirin   81 mg Oral Daily   atorvastatin   40 mg Oral Daily   cinacalcet   30 mg Oral Q breakfast   doxercalciferol   3 mcg Intravenous Q M,W,F-HD   feeding supplement  237 mL Oral BID BM   insulin  aspart  0-5 Units Subcutaneous QHS   insulin  aspart  0-9 Units Subcutaneous TID WC   insulin  glargine-yfgn  6 Units Subcutaneous Daily   midodrine   10 mg  Oral TID WC   multivitamin  1 tablet Oral QHS   sevelamer  carbonate  4,000 mg Oral TID WC   thiamine  (VITAMIN B1) injection  100 mg Intravenous Daily   Or   thiamine   100 mg Oral Daily    BMET    Component Value Date/Time   NA 133 (L) 11/23/2023 0840   K 4.6 11/23/2023 0840   CL 89 (L) 11/23/2023 0840   CO2 21 (L) 11/23/2023 0840   GLUCOSE 276 (H) 11/23/2023 0840   BUN 78 (H) 11/23/2023 0840   CREATININE 13.17 (H) 11/23/2023 0840   CALCIUM  10.1 11/23/2023 0840   GFRNONAA 4 (L) 11/23/2023 0840   GFRAA 3 (L) 08/14/2019 2051   CBC    Component Value Date/Time   WBC 9.0 11/23/2023 0840   RBC 4.91 11/23/2023 0840   HGB 14.2 11/23/2023 0840   HGB 13.0 12/17/2022 1242   HCT 43.7 11/23/2023 0840   PLT 400 11/23/2023 0840   PLT 272 12/17/2022 1242   MCV 89.0 11/23/2023 0840   MCH 28.9 11/23/2023 0840   MCHC 32.5 11/23/2023 0840   RDW 16.3 (H) 11/23/2023 0840   LYMPHSABS 1.3 11/18/2023 1515   MONOABS 1.7 (H) 11/18/2023 1515   EOSABS 0.3 11/18/2023 1515   BASOSABS 0.1 11/18/2023 1515    Outpt Dialysis Orders:  Center: [facility] MWF 4h  450/700  105kg   2/2 bath   AVF - Heparin  7000 + 2000 mid run - Hectorol  7 mcg IV three times per week    Assessment/Plan: 1.AMS: Was on cefepime  which has been stopped. Per primary team some concern for encephalitis/meningitis, was on empiric antibiotics/antivirals until seen by ID on 11/18/23 and all was stopped.  S/P LP which ruled out bacterail meningitis/encephalitis. He did test + for flu A by resp panel. MRI without acute abnormality.  ID stopped abx and antivirals and signed off. Getting trial of high-dose thiamine .  2.  ESRD - usual HD is MWF. HD today on schedule.  3. NSTEMI- work up per cardiology/primary.  4. Hyperkalemia- resolved with HD.  5. Fever/Hypotension- BC remain no growth. Was started on board spectrum antibiotics/antivirals but now discontinued.  ID signed off. 6. HFrEF/history of severe AS- attempting to optimize volume with dialysis. ECHO  EF 30 to 35%. Seen by Cards. Fluid restrictions and daily weights. On midodrine  10 mg 3 times daily.  7. Anemia  - Hgb above goal, no ESA indicated at this time.  8. Metabolic bone disease -Phos at goal. Continue renvela . VDRA on hold d/t high calcium ; coming down.   [name]  MD  [facility] 11/23/2023, 3:34 PM  Recent Labs  Lab 11/22/23 1044 11/23/23 0840  HGB 16.2 14.2  ALBUMIN  3.3* 3.1*  CALCIUM  10.4* 10.1  PHOS 7.2* 8.0*  CREATININE 11.72* 13.17*  K 4.4 4.6    Inpatient medications:  aspirin   81 mg Oral Daily   atorvastatin   40 mg Oral Daily   cinacalcet   30 mg Oral Q breakfast   doxercalciferol   3 mcg Intravenous Q M,W,F-HD   feeding supplement  237 mL Oral BID BM   insulin  aspart  0-5 Units Subcutaneous QHS   insulin  aspart  0-9 Units Subcutaneous TID WC   insulin  glargine-yfgn  6 Units Subcutaneous Daily   midodrine   10 mg Oral TID WC   multivitamin  1 tablet Oral QHS   sevelamer  carbonate  4,000 mg Oral TID WC   thiamine  (VITAMIN B1) injection  100 mg Intravenous Daily  Or   thiamine   100 mg Oral Daily    acetaminophen  **OR** acetaminophen , ondansetron  **OR** ondansetron  (ZOFRAN ) IV, mouth rinse, mouth rinse, mouth  rinse, sevelamer  carbonate

## 2023-11-24 ENCOUNTER — Inpatient Hospital Stay (INDEPENDENT_AMBULATORY_CARE_PROVIDER_SITE_OTHER): Payer: Medicare Other | Admitting: Primary Care

## 2023-11-24 DIAGNOSIS — R195 Other fecal abnormalities: Secondary | ICD-10-CM

## 2023-11-24 DIAGNOSIS — N186 End stage renal disease: Secondary | ICD-10-CM | POA: Diagnosis not present

## 2023-11-24 DIAGNOSIS — R197 Diarrhea, unspecified: Secondary | ICD-10-CM

## 2023-11-24 DIAGNOSIS — K922 Gastrointestinal hemorrhage, unspecified: Secondary | ICD-10-CM | POA: Diagnosis not present

## 2023-11-24 DIAGNOSIS — G9341 Metabolic encephalopathy: Secondary | ICD-10-CM | POA: Diagnosis not present

## 2023-11-24 DIAGNOSIS — I214 Non-ST elevation (NSTEMI) myocardial infarction: Secondary | ICD-10-CM | POA: Diagnosis not present

## 2023-11-24 DIAGNOSIS — I5023 Acute on chronic systolic (congestive) heart failure: Secondary | ICD-10-CM | POA: Diagnosis not present

## 2023-11-24 LAB — IRON AND TIBC
Iron: 46 ug/dL (ref 45–182)
Saturation Ratios: 16 % — ABNORMAL LOW (ref 17.9–39.5)
TIBC: 294 ug/dL (ref 250–450)
UIBC: 248 ug/dL

## 2023-11-24 LAB — GLUCOSE, CAPILLARY
Glucose-Capillary: 211 mg/dL — ABNORMAL HIGH (ref 70–99)
Glucose-Capillary: 241 mg/dL — ABNORMAL HIGH (ref 70–99)
Glucose-Capillary: 280 mg/dL — ABNORMAL HIGH (ref 70–99)
Glucose-Capillary: 317 mg/dL — ABNORMAL HIGH (ref 70–99)
Glucose-Capillary: 314 mg/dL — ABNORMAL HIGH (ref 70–99)

## 2023-11-24 LAB — FERRITIN: Ferritin: 421 ng/mL — ABNORMAL HIGH (ref 24–336)

## 2023-11-24 MED ORDER — METOPROLOL TARTRATE 12.5 MG HALF TABLET
12.5000 mg | ORAL_TABLET | Freq: Two times a day (BID) | ORAL | Status: DC
  Administered 2023-11-24 – 2023-11-26 (×5): 12.5 mg via ORAL
  Filled 2023-11-24 (×6): qty 1

## 2023-11-24 MED ORDER — GERHARDT'S BUTT CREAM
TOPICAL_CREAM | Freq: Two times a day (BID) | CUTANEOUS | Status: AC
  Filled 2023-11-24: qty 60

## 2023-11-24 MED ORDER — INSULIN GLARGINE-YFGN 100 UNIT/ML ~~LOC~~ SOLN
10.0000 [IU] | Freq: Every day | SUBCUTANEOUS | Status: DC
  Administered 2023-11-25: 10 [IU] via SUBCUTANEOUS
  Filled 2023-11-24: qty 0.1

## 2023-11-24 MED ORDER — CHLORHEXIDINE GLUCONATE CLOTH 2 % EX PADS
6.0000 | MEDICATED_PAD | Freq: Every day | CUTANEOUS | Status: DC
  Administered 2023-11-25 – 2023-12-13 (×17): 6 via TOPICAL

## 2023-11-24 NOTE — Inpatient Diabetes Management (Signed)
 Inpatient Diabetes Program Recommendations  AACE/ADA: New Consensus Statement on Inpatient Glycemic Control (2015)  Target Ranges:  Prepandial:   less than 140 mg/dL      Peak postprandial:   less than 180 mg/dL (1-2 hours)      Critically ill patients:  140 - 180 mg/dL   Lab Results  Component Value Date   GLUCAP 241 (H) 11/24/2023   HGBA1C >15.5 (H) 10/24/2023    Review of Glycemic Control  Latest Reference Range & Units 11/23/23 16:29 11/23/23 20:54 11/24/23 05:17 11/24/23 08:20  Glucose-Capillary 70 - 99 mg/dL 771 (H) 718 (H) 788 (H) 241 (H)  (H): Data is abnormally high Diabetes history: Type 2 DM Outpatient Diabetes medications: Novolog  70/30 30 units QA, 25 units P Current orders for Inpatient glycemic control:  Novolog  0-9 units TID & HS, Semglee  6 units every day  Inpatient Diabetes Program Recommendations:    Consider further increasing Semglee  to 10 units every day.   Thanks, Tinnie Minus, MSN, RNC-OB Diabetes Coordinator [phone] (8a-5p)

## 2023-11-24 NOTE — Progress Notes (Signed)
 Occupational Therapy Treatment Patient Details Name: [name] GLASSCOCK Sr. MRN: [id number] DOB: 1961-05-28 Today's Date: 11/24/2023   History of present illness Pt is 63 year old presented to [facility] on  11/08/23 after experiencing a near syncope during HD. Pt being worked up for an NSTEMI with planned LHC and RHC possibly 11/16/23, and fever with acute metabolic encephalopathy.  Pt recently admitted 12/6-12/10/24 for a fall. PMH - htn, cardiomyopathy, CHF, dm, esrd on hd, R BKA, CVA   OT comments  Pt making slow progress with functional goals. Pt pleasant but required encouragement to participate, perseverating on ice water and calling talking to family member on phone. Pt rolled left and right in bed using rails mod A, required max A to sit EOB and for sitting balance during grooming and UB dressing tasks. OT will continue to follow acutely to maximize level of function and safety      If plan is discharge home, recommend the following:  Two people to help with walking and/or transfers;Assistance with cooking/housework;Supervision due to cognitive status;A lot of help with bathing/dressing/bathroom   Equipment Recommendations  None recommended by OT    Recommendations for Other Services      Precautions / Restrictions Precautions Precautions: Fall Other Brace: R prosthetic (not sure if fitting) Restrictions Weight Bearing Restrictions Per Provider Order: Yes RLE Weight Bearing Per Provider Order: Non weight bearing       Mobility Bed Mobility Overal bed mobility: Needs Assistance Bed Mobility: Rolling, Sidelying to Sit, Sit to Sidelying Rolling: Min assist Sidelying to sit: Max assist     Sit to sidelying: Max assist General bed mobility comments: required Max/Total assist for sitting balance/support at EOB including to hold head upright.    Transfers                   General transfer comment: unsafe to attempt     Balance Overall balance assessment: Needs  assistance Sitting-balance support: Feet supported, Bilateral upper extremity supported Sitting balance-Leahy Scale: Poor Sitting balance - Comments: max A Postural control: Left lateral lean                                 ADL either performed or assessed with clinical judgement   ADL       Grooming: Wash/dry hands;Wash/dry face;Maximal assistance;Sitting Grooming Details (indicate cue type and reason): sitting EOB, Poor sitting balance         Upper Body Dressing : Maximal assistance;Sitting                     General ADL Comments: Pt sat EOB ~5 minutes for grooming and UB dressing tasks, max A for balance/support    Extremity/Trunk Assessment Upper Extremity Assessment Upper Extremity Assessment: Generalized weakness LUE Deficits / Details: L hand seems hypersensitive, pt flinches any light touch. Very minimal shoulder AROM, MPs contracted   Lower Extremity Assessment Lower Extremity Assessment: Defer to PT evaluation        Vision Patient Visual Report: No change from baseline     Perception     Praxis      Cognition Arousal: Alert Behavior During Therapy: WFL for tasks assessed/performed Overall Cognitive Status: Impaired/Different from baseline Area of Impairment: Memory, Following commands, Safety/judgement, Awareness, Problem solving                     Memory: Decreased short-term memory Following Commands:  Follows one step commands inconsistently, Follows one step commands with increased time Safety/Judgement: Decreased awareness of safety   Problem Solving: Slow processing, Decreased initiation, Requires verbal cues, Difficulty sequencing General Comments: Pt perseverating on makig phone calls to family, drinks and ice, required redirection        Exercises      Shoulder Instructions       General Comments      Pertinent Vitals/ Pain       Pain Assessment Pain Assessment: Faces Faces Pain Scale: Hurts  little more Pain Location: L UE with touch Pain Descriptors / Indicators: Grimacing, Guarding Pain Intervention(s): Monitored during session, Repositioned  Home Living                                          Prior Functioning/Environment              Frequency  Min 1X/week        Progress Toward Goals  OT Goals(current goals can now be found in the care plan section)  Progress towards OT goals: Progressing toward goals     Plan      Co-evaluation                 AM-PAC OT 6 Clicks Daily Activity     Outcome Measure   Help from another person eating meals?: A Little Help from another person taking care of personal grooming?: A Little Help from another person toileting, which includes using toliet, bedpan, or urinal?: A Lot Help from another person bathing (including washing, rinsing, drying)?: A Lot Help from another person to put on and taking off regular upper body clothing?: A Lot Help from another person to put on and taking off regular lower body clothing?: Total 6 Click Score: 13    End of Session    OT Visit Diagnosis: Unsteadiness on feet (R26.81);Other abnormalities of gait and mobility (R26.89);Muscle weakness (generalized) (M62.81)   Activity Tolerance Patient limited by fatigue   Patient Left in bed;with call bell/phone within reach;with bed alarm set   Nurse Communication          Time: 8958-8894 OT Time Calculation (min): 24 min  Charges: OT General Charges $OT Visit: 1 Visit OT Treatments $Self Care/Home Management : 8-22 mins $Therapeutic Activity: 8-22 mins    [name] [name] Loose 11/24/2023, 12:52 PM

## 2023-11-24 NOTE — Consult Note (Addendum)
 Consultation  Referring Provider:   Dr. Briana Primary Care Physician:  Pcp, No Primary Gastroenterologist: Dr. Leigh       Reason for Consultation:   GI Bleed         HPI:   [name] SHAFF Sr. is a 63 y.o. male with a past medical history significant for ESRD on HD (Monday Wednesday Friday), heart failure, type 2 diabetes, CVA, and status post right BKA as well as obesity who initially presented to the hospital on 11/08/2023 for syncope during hemodialysis.  We are consulted now in regards to a GI Bleed.    Patient had recent hospitalization 12/6-12/10 for uncontrolled hyperglycemia.    At time of admission patient was unable to provide detailed history due to acute cognitive impairment.  Apparently having hemodialysis on 12/22 and noted to have chest pain with hypertension along with a near syncopal episode.  Also found to have very elevated high sensitive troponin and cardiology was consulted for eval.  Diagnosed with NSTEMI with plan to admit.    Today, on my interview patient is oriented and answers questions appropriately, but still seems altered.  Asking some very strange questions and only providing one-word answers.  I did discuss his care with his nurse.  Per her he has had Hemoccult positive stools since 11/20/2023, this is her first day with him though so is unsure if they have actually been seeing bloody bowel movements.  For her he has been passing a maroon-colored stool, 1 time this morning so far.  Does note some skin breakdown/dermatitis on both sides of his buttocks which she has been applying silver  sulfadiazine  cream to.  No acute bleeding noted from these areas or pressure ulcers.  Continues with loose stools.    Patient does deny abdominal pain, nausea or vomiting (though I am not sure how accurate this is).     Hospital course/ Problems: -Diagnosed with NSTEMI and admitted patient to [facility] -Patient treated for possible meningitis without significant improvement in  mental status -EEG without evidence of seizures -LP obtained and suggested no meningitis -Etiology thought likely metabolic with prolonged recovery phase-mental status improving -Cardiology following for NSTEMI-peak troponin 8942, manage medically on IV heparin , Aspirin  and Lipitor, echo with LVEF of 30-35% and regional wall maladies, moderate to severe aortic stenosis, cardiology signed off with recommendations to reconsult once mental status improved-they have now been reconsulted -ESRD on HD-nephrology on board -Diarrhea-C. difficile negative/improved, GI path panel pending  GI history: 11/20/2022 office visit with Dr. Leigh for elevated LFTs with workup as below, plans at that point to repeat LFTs, INR, IgG, SPEP, serum free light chains and review path slide for IgG4 disease referred to hepatology, recommended colonoscopy or Cologuard once acute issues have been addressed  Acute hep panel negative Monospot negative   AMA negative SMA negative IgG4 109 (mildly elevated) IgG 2871 ANA negative Iron  46, iron  sat 27% Ferritin 1410 Hemochromatosis genetic testing negative   Liver biopsy 11/07/22: FINAL MICROSCOPIC DIAGNOSIS:  A. LIVER, RIGHT LOBE, BIOPSY: Acute hepatitis with cholestasis, portal fibrosis and increased stainable iron   COMMENT: Sections show multiple cores of hepatic parenchyma with sinusoidal and disarray and a patchy acute inflammatory cell infiltrate with associated cholate stasis and feathery degeneration.  The portal triads show a fibrous expansion with bile ductular proliferation and a negligible chronic inflammatory cell infiltrate.  No acidophil bodies or granulomas are seen.  A trichrome stain with adequate control demonstrates sinusoidal and pericellular fibrosis and accentuates of  the bile ductular proliferation within the portal triads with associated portal fibrosis.  Reticulin stain with adequate control shows similar features. No sinusoidal is  seen.  An iron  stain with adequate control shows increased stainable iron  predominantly Kupffer cell iron .   A PAS stain with diastase shows scattered intracellular small granules.  No alpha-1 antitrypsin globules are seen.  Despite the negative serologies, the differential diagnosis for these changes remains broad and includes a drug effect, large duct obstruction, sepsis and cardiogenic stasis.  The increased stainable iron  is in a mixed pattern suggestive of cardiogenic etiology; however, it is nonspecific and also may be secondary to chronic transfusions.  No tumor is seen.   Past Medical History:  Diagnosis Date   ESRD on hemodialysis (HCC)    a. HD MWF > 10 years.   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 01/2022 Echo: EF 45-50%, glob HK, mod LVH, mildly reduced RV fxn, mild MR, mod-sev MV Ca2+. Mild AS; b. 10/2023 Echo: EF 45-50%, glob HK, GrII DD, mildly reduced RV fxn, RVSP 52.87mmHg. Mild MR/MS, mod TR.   Hypertension    Nonischemic cardiomyopathy (HCC)    a. 2019 Cath [facility]): Nonobstructive disease; b. 12/2019 Echo: EF 50%; c. 12/2019 MV (Tx w/u [facility]): Ant infarct w/ peri-infarct ischemia. EF 35%; c. 05/2021 MV (Tx w/u [facility]): Cor Ca2+, EF 53%, motion artifact, equiv study, likely low risk.   Nonobstructive coronary artery disease    a.  2019 Cath ([place]): Left main normal, LAD 30 proximal, RCA 30 distal, EF 25%.   Obesity    Type 2 diabetes mellitus (HCC)    a. 10/2023 - admission w/ HHS.    Past Surgical History:  Procedure Laterality Date   A/V FISTULAGRAM N/A 08/15/2019   Procedure: A/V FISTULAGRAM;  Surgeon: Sheree Penne Bruckner, MD;  Location: [facility] INVASIVE CV LAB;  Service: Cardiovascular;  Laterality: N/A;   AMPUTATION Right 02/02/2022   Procedure: AMPUTATION 5th RAY;  Surgeon: Joya Stabs, MD;  Location: [facility] OR;  Service: Podiatry;  Laterality: Right;   AMPUTATION Right 05/23/2022   Procedure: RIGHT BELOW KNEE AMPUTATION;   Surgeon: Harden Jerona GAILS, MD;  Location: [facility] OR;  Service: Orthopedics;  Laterality: Right;   AMPUTATION Right 07/04/2022   Procedure: REVISION AMPUTATION BELOW KNEE;  Surgeon: Harden Jerona GAILS, MD;  Location: [facility] OR;  Service: Orthopedics;  Laterality: Right;   APPLICATION OF WOUND VAC  05/23/2022   Procedure: APPLICATION OF WOUND VAC;  Surgeon: Harden Jerona GAILS, MD;  Location: [facility] OR;  Service: Orthopedics;;   AV FISTULA PLACEMENT     CARDIAC CATHETERIZATION  02/17/2018   LHC 02/17/18 ([name]): 30% pLAD, 30% dRCA, LVEDP 25-30, LVEF 25%.     CATARACT EXTRACTION W/PHACO Right 03/29/2020   Procedure: CATARACT EXTRACTION PHACO AND INTRAOCULAR LENS PLACEMENT (IOC)  RIGHT DIABETIC;  Surgeon: Mittie Gaskin, MD;  Location: [facility] ORS;  Service: Ophthalmology;  Laterality: Right;  Lot # [id number] H US : 02:30.9 AP% 9.5% CDE: 14.38   EYE SURGERY Right    cataract removed   I & D EXTREMITY Right 02/02/2022   Procedure: IRRIGATION AND DEBRIDEMENT RIGHT FOOT;  Surgeon: Joya Stabs, MD;  Location: [facility] OR;  Service: Podiatry;  Laterality: Right;   INCISION AND DRAINAGE OF WOUND Right 02/08/2022   Procedure: IRRIGATION AND DEBRIDEMENT WOUND;  Surgeon: Burt Fus, DPM;  Location: [facility] OR;  Service: Podiatry;  Laterality: Right;  with removal of infected bone    INSERTION OF DIALYSIS CATHETER  08/13/2019   Procedure: Insertion Of Dialysis Catheter;  Surgeon: Oris Krystal FALCON, MD;  Location: [facility] OR;  Service: Vascular;;   INSERTION OF DIALYSIS CATHETER Left 07/19/2020   Procedure: INSERTION OF DIALYSIS CATHETER;  Surgeon: Sheree Penne Bruckner, MD;  Location: [facility] OR;  Service: Vascular;  Laterality: Left;   IR LUMBAR PUNCTURE  11/20/2023   IR US  GUIDE BX ASP/DRAIN  11/07/2022   IRRIGATION AND DEBRIDEMENT ABSCESS     PERIPHERAL VASCULAR BALLOON ANGIOPLASTY  08/15/2019   Procedure: PERIPHERAL VASCULAR BALLOON ANGIOPLASTY;  Surgeon: Sheree Penne Bruckner, MD;  Location: [facility] INVASIVE CV LAB;  Service:  Cardiovascular;;   PERIPHERAL VASCULAR BALLOON ANGIOPLASTY Left 02/06/2022   Procedure: PERIPHERAL VASCULAR BALLOON ANGIOPLASTY;  Surgeon: Gretta Bruckner PARAS, MD;  Location: [facility] INVASIVE CV LAB;  Service: Cardiovascular;  Laterality: Left;   RADIOLOGY WITH ANESTHESIA N/A 11/20/2023   Procedure: MRI WITH ANESTHESIA;  Surgeon: Radiologist, Medication, MD;  Location: [facility] OR;  Service: Radiology;  Laterality: N/A;   RADIOLOGY WITH ANESTHESIA N/A 11/20/2023   Procedure: IR WITH ANESTHESIA;  Surgeon: Alona Corners, DO;  Location: [facility] OR;  Service: Anesthesiology;  Laterality: N/A;  PLEASE OBTAIN OPENING PRESSURE AND DRAIN AT LEAST 30CC'S OF CSF.   REVISON OF ARTERIOVENOUS FISTULA Left 07/19/2020   Procedure: REVISON/PLICATION OF ARTERIOVENOUS FISTULA LEFT;  Surgeon: Sheree Penne Bruckner, MD;  Location: [facility] OR;  Service: Vascular;  Laterality: Left;   THROMBECTOMY AND REVISION OF ARTERIOVENTOUS (AV) GORETEX  GRAFT Left 08/13/2019   Procedure: THROMBECTOMY OF LEFT ARM ARTERIOVENTOUS (AV) FISTULA;  Surgeon: Oris Krystal FALCON, MD;  Location: [facility] OR;  Service: Vascular;  Laterality: Left;   TOE AMPUTATION      Family History  Problem Relation Age of Onset   Hypertension Mother    Social History   Tobacco Use   Smoking status: Never   Smokeless tobacco: Never  Vaping Use   Vaping status: Never Used  Substance Use Topics   Alcohol use: Not Currently   Drug use: Never   Prior to Admission medications   Medication Sig Start Date End Date Taking? Authorizing Provider  acetaminophen  (TYLENOL ) 500 MG tablet Take 1,000 mg by mouth 2 (two) times daily as needed for moderate pain (pain score 4-6), headache or fever.   Yes [provider]  B Complex-C-Folic Acid  (DIALYVITE 800) 0.8 MG TABS Take 1 tablet by mouth daily.   Yes [provider]  cinacalcet  (SENSIPAR ) 30 MG tablet Take 30 mg by mouth daily.   Yes [provider]  insulin  isophane & regular human KwikPen (NOVOLIN  70/30  KWIKPEN) (70-30) 100 UNIT/ML KwikPen Inject 30U in the morning and 25U in the evening 10/27/23  Yes Ezenduka, Nkeiruka J, MD  lidocaine -prilocaine  (EMLA ) cream Apply 1 Application topically Every Tuesday,Thursday,and Saturday with dialysis.   Yes [provider]  sevelamer  carbonate (RENVELA ) 800 MG tablet Take 2,400-4,000 mg by mouth See admin instructions. Take 5 tablets (4000mg ) three times daily with meals and 3 tablets (2400mg ) twice daily with snacks.   Yes [provider]  Insulin  Pen Needle 32G X 4 MM MISC Use with insulin  pen. 10/28/23   Ezenduka, Nkeiruka J, MD   Current Facility-Administered Medications  Medication Dose Route Frequency Provider Last Rate Last Admin   acetaminophen  (TYLENOL ) tablet 650 mg  650 mg Oral Q6H PRN Arrien, Mauricio Daniel, MD   650 mg at 11/13/23 0411   Or   acetaminophen  (TYLENOL ) suppository 650 mg  650 mg Rectal Q6H PRN Arrien, Elidia Sieving, MD  650 mg at 11/10/23 2045   aspirin  chewable tablet 81 mg  81 mg Oral Daily Arrien, Elidia Sieving, MD   81 mg at 11/24/23 9177   atorvastatin  (LIPITOR) tablet 40 mg  40 mg Oral Daily Arrien, Elidia Sieving, MD   40 mg at 11/24/23 9176   cinacalcet  (SENSIPAR ) tablet 30 mg  30 mg Oral Q breakfast Arrien, Elidia Sieving, MD   30 mg at 11/24/23 9177   doxercalciferol  (HECTOROL ) injection 3 mcg  3 mcg Intravenous Q M,W,F-HD Skip Lenis, PA-C   3 mcg at 11/23/23 1101   feeding supplement (ENSURE ENLIVE / ENSURE PLUS) liquid 237 mL  237 mL Oral BID BM Briana Elgin LABOR, MD   237 mL at 11/24/23 9176   Gerhardt's butt cream   Topical BID Franky Redia SAILOR, MD   Given at 11/24/23 9175   insulin  aspart (novoLOG ) injection 0-5 Units  0-5 Units Subcutaneous QHS Bryn Bernardino NOVAK, MD   3 Units at 11/23/23 2159   insulin  aspart (novoLOG ) injection 0-9 Units  0-9 Units Subcutaneous TID WC Bryn Bernardino NOVAK, MD   3 Units at 11/24/23 0823   [START ON 11/25/2023] insulin  glargine-yfgn (SEMGLEE ) injection 10 Units  10  Units Subcutaneous Daily Briana Elgin LABOR, MD       midodrine  (PROAMATINE ) tablet 10 mg  10 mg Oral TID WC Delores Ricka Maidens, NP   10 mg at 11/24/23 9176   multivitamin (RENA-VIT) tablet 1 tablet  1 tablet Oral QHS Briana Elgin LABOR, MD   1 tablet at 11/23/23 2159   ondansetron  (ZOFRAN ) tablet 4 mg  4 mg Oral Q6H PRN Arrien, Elidia Sieving, MD       Or   ondansetron  (ZOFRAN ) injection 4 mg  4 mg Intravenous Q6H PRN Arrien, Elidia Sieving, MD       Oral care mouth rinse  15 mL Mouth Rinse PRN Arrien, Elidia Sieving, MD       Oral care mouth rinse  15 mL Mouth Rinse PRN Sira, Zackery, MD       Oral care mouth rinse  15 mL Mouth Rinse PRN Sira, Zackery, MD       sevelamer  carbonate (RENVELA ) tablet 2,400 mg  2,400 mg Oral BID PRN Arrien, Elidia Sieving, MD       sevelamer  carbonate (RENVELA ) tablet 4,000 mg  4,000 mg Oral TID WC Arrien, Elidia Sieving, MD   4,000 mg at 11/24/23 9177   thiamine  (VITAMIN B1) injection 100 mg  100 mg Intravenous Daily Khaliqdina, Salman, MD   100 mg at 11/23/23 1246   Or   thiamine  (VITAMIN B1) tablet 100 mg  100 mg Oral Daily Khaliqdina, Salman, MD   100 mg at 11/24/23 9176    Allergies as of 11/08/2023 - Review Complete 11/08/2023  Allergen Reaction Noted   Sulfa antibiotics Diarrhea and Nausea And Vomiting 12/10/2019    Review of Systems (unsure how accurate due to mental status):    Constitutional: No weight loss Skin: No rash Cardiovascular: No chest pain Respiratory: No SOB  Gastrointestinal: See HPI and otherwise negative Genitourinary: No dysuria  Neurological: No headache, dizziness or syncope Musculoskeletal: No new muscle or joint pain Hematologic: No bruising Psychiatric: No history of depression or anxiety    Physical Exam:  Vital signs in last 24 hours: Temp:  [97.5 F (36.4 C)-98.6 F (37 C)] 98 F (36.7 C) (01/07 0730) Pulse Rate:  [67-94] 67 (01/07 0730) Resp:  [12-20] 12 (01/07 0730) BP: (92-122)/(52-79) 109/57 (01/07  0730) SpO2:  [94 %-98 %] 95 % (01/07 0730) Weight:  [96.5 kg-97 kg] 96.5 kg (01/07 0730) Last BM Date : 11/24/23 General:   Pleasant AA male appears to be in NAD, Well developed, Well nourished, alert and cooperative Head:  Normocephalic and atraumatic. Eyes:   PEERL, EOMI. No icterus. Conjunctiva pink. Ears:  Normal auditory acuity. Neck:  Supple Throat: Oral cavity and pharynx without inflammation, swelling or lesion. Teeth in good condition. Lungs: Respirations even and unlabored. Lungs clear to auscultation bilaterally.   No wheezes, crackles, or rhonchi.  Heart: Normal S1, S2. No MRG. Regular rate and rhythm. No peripheral edema, cyanosis or pallor.  Abdomen:  Soft, nondistended, nontender. No rebound or guarding. Normal bowel sounds. No appreciable masses or hepatomegaly. Rectal: External: Gluteal cleft is filled with silver  sulfadiazine  cream, somewhat hard to see underneath it, outside of rectum with some residue of maroonish stool, patient does have some discomfort with palpation of the skin around his rectum; internal: No mass, maroon stool, some discomfort posteriorly, unable to visualize a fissure Msk:  Symmetrical without gross deformities. Peripheral pulses intact.  Extremities:  Without edema, no deformity or joint abnormality. Normal ROM, normal sensation. Neurologic:  Alert and  oriented x3;  grossly normal neurologically.  Skin:   Dry and intact without significant lesions or rashes. Psychiatric: + One-word answers and some strange questions still seems altered  LAB RESULTS: Recent Labs    11/22/23 1044 11/23/23 0840  WBC 9.5 9.0  HGB 16.2 14.2  HCT 49.9 43.7  PLT 367 400   BMET Recent Labs    11/22/23 1044 11/23/23 0840  NA 135 133*  K 4.4 4.6  CL 92* 89*  CO2 21* 21*  GLUCOSE 200* 276*  BUN 61* 78*  CREATININE 11.72* 13.17*  CALCIUM  10.4* 10.1   LFT Recent Labs    11/23/23 0840  ALBUMIN  3.1*     Impression / Plan:   Impression: 1.  GI bleed:  Hemoglobin 16.2--> 14.2 overnight, no prior colonoscopy, maroon stool on exam today, Hemoccult positive on 11/20/2023, unfortunately no documentation of bloody stools, nurse today has only been with him for today and notes maroon loose stool 2.  Acute metabolic encephalopathy: initial concern for encephalitis versus meningitis, workup so far unrevealing, neurology recommending continued supportive care with anticipation of patient will have a prolonged recovery encephalopathy related to hypoglycemia mental status improving 3.  NSTEMI: Cardiology initially consulted, troponin elevation with a peak of 8942, echo with LVEF 30-35% and regional wall motion abnormalities, cardiology now reconsulted since mental status has improved 4.  ESRD on HD 5.  Acute on chronic diastolic heart failure 6.  Diarrhea: C. difficile negative, GI path panel being sent today 7.  Diabetes type 2 8.  History of CVA  Plan: 1.  Per hospitalist note they have reconsulted cardiology.  At the moment hemoglobin appears somewhat stable, would continue to monitor for increase in GI bleeding or dramatic decrease in hemoglobin.  Otherwise we will see what cardiology has to say, ideally patient would have colonoscopy +/- endoscopy for further evaluation. 2.  Will check iron  studies today.  Continue to monitor hemoglobin with transfusion as needed less than 7. 3.  We will see what cardiology's plans are but likely patient would benefit from at least a colonoscopy during this hospitalization given his ESRD. 4.  We will continue to follow along. 5.  Continue other supportive measures. 6. Await results from GI pathogen panel  Thank you for your kind consultation,  we will continue to follow.  Delon Gibson [facility]  11/24/2023, 11:58 AM   Attending physician's note  I have taken a history, reviewed the chart and examined the patient. I performed a substantive portion of this encounter, including complete performance of at least one of the  key components, in conjunction with the APP. I agree with the APP's note, impression and recommendations.   63 year old male with multiple comorbidities, end-stage renal disease on hemodialysis with acute metabolic encephalopathy and NSTEMI  He had episode of bloody stool, hemoglobin drifted down slightly. Differential includes ischemic colitis, cannot exclude AVM or neoplastic lesion.  Will continue to monitor He will benefit from colonoscopy and also possible EGD for further evaluation of heme positive stool once mental status improves and is cleared by cardiology    LOIS Veena Finlay [place] , [place] [phone]

## 2023-11-24 NOTE — Progress Notes (Signed)
 Pt's wallet and jeans were found on [address]. Belongings returned to [name] [name] with Dr. Geralynn at the bedside. I did make the patient aware we did not find $1000 that he stated was in the wallet. Pt's primary RN Laneta made aware belongings were returned to patient.

## 2023-11-24 NOTE — TOC Progression Note (Addendum)
 Transition of Care (TOC) - Progression Note    Patient Details  Name: [name] TWYFORD Sr. MRN: [id number] Date of Birth: 19-Jan-1961  Transition of Care [facility]) CM/SW Contact  Luise JAYSON Pan, [place] Phone Number: 11/24/2023, 8:42 AM  Clinical Narrative:  CSW spoke with pt and dtr Catering Manager) about bed offers for SNF. Pt states he wants to [facility] but dtr disclosed she does not want pt to go to [facility]. CSW will f/u with pt and dtr on placement choice.   Expected Discharge Plan: Skilled Nursing Facility Barriers to Discharge: SNF Pending bed offer, Continued Medical Work up  Expected Discharge Plan and Services       Living arrangements for the past 2 months: Single Family Home                           HH Arranged:  (Active with [facility])           Social Determinants of Health (SDOH) Interventions SDOH Screenings   Food Insecurity: Patient Unable To Answer (11/10/2023)  Housing: Patient Unable To Answer (11/10/2023)  Transportation Needs: Unknown (11/10/2023)  Utilities: Patient Unable To Answer (11/10/2023)  Alcohol Screen: Low Risk  (02/04/2022)  Depression (PHQ2-9): Low Risk  (08/06/2022)  Tobacco Use: Low Risk  (11/20/2023)    Readmission Risk Interventions    11/05/2022    3:08 PM  Readmission Risk Prevention Plan  Transportation Screening Complete  Medication Review (RN Care Manager) Complete  PCP or Specialist appointment within 3-5 days of discharge Complete  HRI or Home Care Consult Complete  SW Recovery Care/Counseling Consult Complete  Palliative Care Screening Complete  Skilled Nursing Facility Complete

## 2023-11-24 NOTE — TOC Progression Note (Signed)
 Transition of Care (TOC) - Progression Note    Patient Details  Name: [name] ELISON Sr. MRN: [id number] Date of Birth: [birth year]  Transition of Care [facility]) CM/SW Contact  Luise JAYSON Pan, [place] Phone Number: 11/24/2023, 11:16 AM  Clinical Narrative:   CSW spoke with pts dtr, [name] [name], about choice of SNF. [name] [name] stated she would like to discuss SNF list with her uncle to determine which facility they will choose.   TOC will continue to follow.    Expected Discharge Plan: Skilled Nursing Facility Barriers to Discharge: SNF Pending bed offer, Continued Medical Work up  Expected Discharge Plan and Services       Living arrangements for the past 2 months: Single Family Home                           HH Arranged:  (Active with [facility])           Social Determinants of Health (SDOH) Interventions SDOH Screenings   Food Insecurity: Patient Unable To Answer (11/10/2023)  Housing: Patient Unable To Answer (11/10/2023)  Transportation Needs: Unknown (11/10/2023)  Utilities: Patient Unable To Answer (11/10/2023)  Alcohol Screen: Low Risk  (02/04/2022)  Depression (PHQ2-9): Low Risk  (08/06/2022)  Tobacco Use: Low Risk  (11/20/2023)    Readmission Risk Interventions    11/05/2022    3:08 PM  Readmission Risk Prevention Plan  Transportation Screening Complete  Medication Review (RN Care Manager) Complete  PCP or Specialist appointment within 3-5 days of discharge Complete  HRI or Home Care Consult Complete  SW Recovery Care/Counseling Consult Complete  Palliative Care Screening Complete  Skilled Nursing Facility Complete

## 2023-11-24 NOTE — Anesthesia Postprocedure Evaluation (Signed)
 Anesthesia Post Note  Patient: [name] LOESCHER Sr.  Procedure(s) Performed: IR WITH ANESTHESIA     Patient location during evaluation: PACU Level of consciousness: confused and patient cooperative Pain management: pain level controlled Vital Signs Assessment: post-procedure vital signs reviewed and stable Respiratory status: spontaneous breathing, nonlabored ventilation, respiratory function stable and patient connected to face mask oxygen Cardiovascular status: stable and blood pressure returned to baseline Postop Assessment: no apparent nausea or vomiting Anesthetic complications: no   No notable events documented.              Anakin Varkey

## 2023-11-24 NOTE — Plan of Care (Signed)
  Problem: Education: Goal: Knowledge of General Education information will improve Description: Including pain rating scale, medication(s)/side effects and non-pharmacologic comfort measures Outcome: Progressing   Problem: Health Behavior/Discharge Planning: Goal: Ability to manage health-related needs will improve Outcome: Progressing   Problem: Clinical Measurements: Goal: Ability to maintain clinical measurements within normal limits will improve Outcome: Progressing Goal: Will remain free from infection Outcome: Progressing Goal: Diagnostic test results will improve Outcome: Progressing Goal: Respiratory complications will improve Outcome: Progressing Goal: Cardiovascular complication will be avoided Outcome: Progressing   Problem: Activity: Goal: Risk for activity intolerance will decrease Outcome: Progressing   Problem: Nutrition: Goal: Adequate nutrition will be maintained Outcome: Progressing   Problem: Coping: Goal: Level of anxiety will decrease Outcome: Progressing   Problem: Elimination: Goal: Will not experience complications related to bowel motility Outcome: Progressing Goal: Will not experience complications related to urinary retention Outcome: Progressing   Problem: Pain Management: Goal: General experience of comfort will improve Outcome: Progressing   Problem: Safety: Goal: Ability to remain free from injury will improve Outcome: Progressing   Problem: Skin Integrity: Goal: Risk for impaired skin integrity will decrease Outcome: Progressing   Problem: Education: Goal: Ability to describe self-care measures that may prevent or decrease complications (Diabetes Survival Skills Education) will improve Outcome: Progressing Goal: Individualized Educational Video(s) Outcome: Progressing   Problem: Coping: Goal: Ability to adjust to condition or change in health will improve Outcome: Progressing   Problem: Fluid Volume: Goal: Ability to  maintain a balanced intake and output will improve Outcome: Progressing   Problem: Health Behavior/Discharge Planning: Goal: Ability to identify and utilize available resources and services will improve Outcome: Progressing Goal: Ability to manage health-related needs will improve Outcome: Progressing   Problem: Metabolic: Goal: Ability to maintain appropriate glucose levels will improve Outcome: Progressing   Problem: Nutritional: Goal: Maintenance of adequate nutrition will improve Outcome: Progressing Goal: Progress toward achieving an optimal weight will improve Outcome: Progressing   Problem: Skin Integrity: Goal: Risk for impaired skin integrity will decrease Outcome: Progressing   Problem: Tissue Perfusion: Goal: Adequacy of tissue perfusion will improve Outcome: Progressing   Problem: Safety: Goal: Non-violent Restraint(s) Outcome: Progressing

## 2023-11-24 NOTE — Progress Notes (Signed)
 Patient ID: [name] [name], male   DOB: 1961-11-08, 63 y.o.   MRN: [id number] S: Seen in room.  Talkative today, Ox 2.5.   O:BP 105/72 (BP Location: Right Arm)   Pulse 67   Temp 97.7 F (36.5 C) (Oral)   Resp 13   Ht 6' 1 (1.854 m)   Wt 96.5 kg   SpO2 95%   BMI 28.07 kg/m   Intake/Output Summary (Last 24 hours) at 11/24/2023 1633 Last data filed at 11/24/2023 1244 Gross per 24 hour  Intake 340 ml  Output 0 ml  Net 340 ml   Intake/Output: I/O last 3 completed shifts: In: 60 [P.O.:60] Out: 0   Intake/Output this shift:  Total I/O In: 340 [P.O.:340] Out: 0  Weight change: -1.1 kg Gen: NAD CVS: RRR Resp:CTA Abd: +BS, soft, NT/ND Ext: s/p R BKA, no edema, LUE AVF +T/B  Recent Labs  Lab 11/18/23 1515 11/20/23 0814 11/20/23 1214 11/22/23 1044 11/23/23 0840  NA 136 135  --  135 133*  K 4.3 5.1  --  4.4 4.6  CL 95* 94*  --  92* 89*  CO2 21* 18*  --  21* 21*  GLUCOSE 158* 223*  --  200* 276*  BUN 63* 64*  --  61* 78*  CREATININE 14.18* 13.22*  --  11.72* 13.17*  ALBUMIN  2.8* 3.0* 4.0 3.3* 3.1*  CALCIUM  9.9 10.2  --  10.4* 10.1  PHOS 6.1* 6.5*  --  7.2* 8.0*   Liver Function Tests: Recent Labs  Lab 11/20/23 1214 11/22/23 1044 11/23/23 0840  ALBUMIN  4.0 3.3* 3.1*   No results for input(s): LIPASE, AMYLASE in the last 168 hours. No results for input(s): AMMONIA in the last 168 hours.  CBC: Recent Labs  Lab 11/18/23 1515 11/20/23 0815 11/22/23 1044 11/23/23 0840  WBC 7.4 9.8 9.5 9.0  NEUTROABS 3.9  --   --   --   HGB 15.8 15.8 16.2 14.2  HCT 48.1 48.7 49.9 43.7  MCV 86.7 89.0 88.9 89.0  PLT 454* 444* 367 400   Cardiac Enzymes: No results for input(s): CKTOTAL, CKMB, CKMBINDEX, TROPONINI in the last 168 hours. CBG: Recent Labs  Lab 11/23/23 2054 11/24/23 0517 11/24/23 0820 11/24/23 1242 11/24/23 1605  GLUCAP 281* 211* 241* 280* 317*    Iron  Studies:  Recent Labs    11/24/23 1306  IRON  46  TIBC 294  FERRITIN 421*    Studies/Results: No results found.   aspirin   81 mg Oral Daily   atorvastatin   40 mg Oral Daily   cinacalcet   30 mg Oral Q breakfast   doxercalciferol   3 mcg Intravenous Q M,W,F-HD   feeding supplement  237 mL Oral BID BM   Gerhardt's butt cream   Topical BID   insulin  aspart  0-5 Units Subcutaneous QHS   insulin  aspart  0-9 Units Subcutaneous TID WC   [START ON 11/25/2023] insulin  glargine-yfgn  10 Units Subcutaneous Daily   metoprolol  tartrate  12.5 mg Oral BID   midodrine   10 mg Oral TID WC   multivitamin  1 tablet Oral QHS   sevelamer  carbonate  4,000 mg Oral TID WC   thiamine  (VITAMIN B1) injection  100 mg Intravenous Daily   Or   thiamine   100 mg Oral Daily    BMET    Component Value Date/Time   NA 133 (L) 11/23/2023 0840   K 4.6 11/23/2023 0840   CL 89 (L) 11/23/2023 0840   CO2 21 (L) 11/23/2023  0840   GLUCOSE 276 (H) 11/23/2023 0840   BUN 78 (H) 11/23/2023 0840   CREATININE 13.17 (H) 11/23/2023 0840   CALCIUM  10.1 11/23/2023 0840   GFRNONAA 4 (L) 11/23/2023 0840   GFRAA 3 (L) 08/14/2019 2051   CBC    Component Value Date/Time   WBC 9.0 11/23/2023 0840   RBC 4.91 11/23/2023 0840   HGB 14.2 11/23/2023 0840   HGB 13.0 12/17/2022 1242   HCT 43.7 11/23/2023 0840   PLT 400 11/23/2023 0840   PLT 272 12/17/2022 1242   MCV 89.0 11/23/2023 0840   MCH 28.9 11/23/2023 0840   MCHC 32.5 11/23/2023 0840   RDW 16.3 (H) 11/23/2023 0840   LYMPHSABS 1.3 11/18/2023 1515   MONOABS 1.7 (H) 11/18/2023 1515   EOSABS 0.3 11/18/2023 1515   BASOSABS 0.1 11/18/2023 1515    Outpt Dialysis Orders:  Center: [facility] MWF 4h  450/700  105kg   2/2 bath   AVF - Heparin  7000 + 2000 mid run - Hectorol  7 mcg IV three times per week   Assessment/Plan: 1.AMS: Was on cefepime  which has been stopped. Per primary team some concern for encephalitis/meningitis, was on empiric antibiotics/antivirals until seen by ID on 11/18/23 and all was stopped.  S/P LP which ruled  out bacterail meningitis/encephalitis. He did test + for flu A by resp panel. MRI without acute abnormality.  ID stopped abx and antivirals and signed off. Getting trial of high-dose thiamine . Mental status is improving daily.  2.  ESRD - usual HD is MWF. HD tomorrow.  3. NSTEMI- work up per cardiology/primary.  4. Volume - is under dry wt by 8kg. Small UF w/ HD tomorrow.  5. Fever/Hypotension- BC remain no growth. Was started on board spectrum antibiotics/antivirals but now discontinued.  ID signed off. 6. HFrEF/history of severe AS- attempting to optimize volume with dialysis. ECHO  EF 30 to 35%. Seen by Cards. Fluid restrictions and daily weights. On midodrine  10 mg 3 times daily.  7. Anemia  - Hgb above goal, no ESA indicated at this time.  8. Metabolic bone disease -Phos at goal. Continue renvela . VDRA on hold d/t high calcium ; coming down.   [name]  MD  [facility] 11/24/2023, 4:33 PM  Recent Labs  Lab 11/22/23 1044 11/23/23 0840  HGB 16.2 14.2  ALBUMIN  3.3* 3.1*  CALCIUM  10.4* 10.1  PHOS 7.2* 8.0*  CREATININE 11.72* 13.17*  K 4.4 4.6    Inpatient medications:  aspirin   81 mg Oral Daily   atorvastatin   40 mg Oral Daily   cinacalcet   30 mg Oral Q breakfast   doxercalciferol   3 mcg Intravenous Q M,W,F-HD   feeding supplement  237 mL Oral BID BM   Gerhardt's butt cream   Topical BID   insulin  aspart  0-5 Units Subcutaneous QHS   insulin  aspart  0-9 Units Subcutaneous TID WC   [START ON 11/25/2023] insulin  glargine-yfgn  10 Units Subcutaneous Daily   metoprolol  tartrate  12.5 mg Oral BID   midodrine   10 mg Oral TID WC   multivitamin  1 tablet Oral QHS   sevelamer  carbonate  4,000 mg Oral TID WC   thiamine  (VITAMIN B1) injection  100 mg Intravenous Daily   Or   thiamine   100 mg Oral Daily    acetaminophen  **OR** acetaminophen , ondansetron  **OR** ondansetron  (ZOFRAN ) IV, mouth rinse, mouth rinse, mouth rinse, sevelamer  carbonate

## 2023-11-24 NOTE — Progress Notes (Signed)
 PROGRESS NOTE    [name] [name]  FMW:[id number] DOB: 1961-06-18 DOA: 11/08/2023 PCP: Pcp, No   Brief Narrative: [name] [name] is a 63 y.o. male with medical history significant of ESRD on HD, (MWF) heart failure, T2DM, sp right BKA and obesity who presented after experiencing a near syncope during hemodialysis. Recent hospitalization 12/06 to 12/10 for uncontrolled hyperglycemia. Patient was having HD on 12/22 when he was noted to have chest pain and hypotension, along with a near syncope episode. Apparently treatment was stopped and EMS was called. He wss found hypotensive and was transported to the ED. His blood pressure was initially low in the ED 75/59, but improved to 106/72. He was found to have very elevated high sensitive troponin and cardiology was consulted for evaluation.  He was diagnosed with NSTEMI and plan to admit patient to [facility] under cardiology. Pt was transferred to [facility] for further evaluation. Patient treated for possible meningitis without significant improvement in mental status. EEG without evidence of seizures. LP obtained and suggested no meningitis. After significant workup, etiology likely metabolic with prolonged recovery phase. Patient with improved mental status, close to baseline.   Assessment and Plan:  Acute metabolic encephalopathy Initial concern for encephalitis vs meningitis. Neurology consulted. LP not obtained secondary to heparin  infusion. Patient started empirically on Vancomycin / Cefepime / Flagyl , transitioned to Vancomycin  and Zosyn  as Cefepime  discontinued for fear of possibly contributing to mental status change. Zosyn  transition to Ceftriaxone , ampicillin  and Acyclovir  for CNS coverage. EEG obtained on 11/13/23 and 1/1 significant for no evidence of seizure activity. MRI brain (1/3) without acute abnormality. CSF labs significant for elevated glucose and protein, not consistent with meningitis; meningitis/encephalitis panel negative for  pathogen. Neurology recommendation for continued supportive care with anticipation that patient will have a prolonged recovery of encephalopathy related to hyperglycemia. ID and neurology signed off.  Fever In setting of metabolic encephalopathy. Concern for CNS etiology of infection and patient treated as mentioned above. Patient also found to have an influenza infection. Fever curve has improved with resolution of fevers.  NSTEMI Cardiology consulted. Troponin elevation with peak of 8,942. Patient managed medically on Heparin  IV, aspirin  and Lipitor. Transthoracic Echocardiogram significant for an LVEF of 30-35% with regional wall motion abnormalities, in addition to mildly reduced RV function and moderate-severe aortic stenosis. Cardiology signed off on 1/2 with recommendations for re-consult once patient's mental status has improved. -Cardiology re-consulted  ESRD on HD Nephrology on board for hemodialysis needs.  Acute on chronic diastolic heart failure Fluid management via hemodialysis.  Diarrhea C. Difficile negative. Improved. Possibly antibiotic related. -Check GI pathogen panel  Diabetes mellitus type 2 Uncontrolled with hyperglycemia; last hemoglobin A1C of >15.5%. -Continue Semglee  and SSI  History of CVA -Continue ASA, and Lipitor  Occult positive stool Unclear etiology. Hemoglobin is currently stable. Patient with recurrent bloody stools. -[place] [facility] consult  FEN/GI Improved oral intake with improvement of mental status. Will avoid ng tube at this time. -SLP evaluation for advancement of diet   DVT prophylaxis: SCDs (left leg) Code Status:   Code Status: Full Code Family Communication: None at bedside. Daughter on telephone (1/7) Disposition Plan: Discharge pending ongoing workup/management   Consultants:  Nephrology Infectious disease Neurology  Procedures:     Antimicrobials: Vancomycin  Zosyn  Oseltaivir Flagyl  Ceftriaxone  Cefepime  Acyclovir  Ampicillin     Subjective: No concerns this morning.  Objective: BP (!) 109/57 (BP Location: Right Arm)   Pulse 67   Temp 98 F (36.7 C) (Oral)   Resp  12   Ht 6' 1 (1.854 m)   Wt 96.5 kg   SpO2 95%   BMI 28.07 kg/m   Examination:  General exam: Appears calm and comfortable Respiratory system: Clear to auscultation. Respiratory effort normal. Cardiovascular system: S1 & S2 heard, RRR.  Gastrointestinal system: Abdomen is nondistended, soft and nontender. Normal bowel sounds heard. Central nervous system: Alert and oriented. Musculoskeletal: No edema. No calf tenderness. Right BKA   Data Reviewed: I have personally reviewed following labs and imaging studies  CBC Lab Results  Component Value Date   WBC 9.0 11/23/2023   RBC 4.91 11/23/2023   HGB 14.2 11/23/2023   HCT 43.7 11/23/2023   MCV 89.0 11/23/2023   MCH 28.9 11/23/2023   PLT 400 11/23/2023   MCHC 32.5 11/23/2023   RDW 16.3 (H) 11/23/2023   LYMPHSABS 1.3 11/18/2023   MONOABS 1.7 (H) 11/18/2023   EOSABS 0.3 11/18/2023   BASOSABS 0.1 11/18/2023     Last metabolic panel Lab Results  Component Value Date   NA 133 (L) 11/23/2023   K 4.6 11/23/2023   CL 89 (L) 11/23/2023   CO2 21 (L) 11/23/2023   BUN 78 (H) 11/23/2023   CREATININE 13.17 (H) 11/23/2023   GLUCOSE 276 (H) 11/23/2023   GFRNONAA 4 (L) 11/23/2023   GFRAA 3 (L) 08/14/2019   CALCIUM  10.1 11/23/2023   PHOS 8.0 (H) 11/23/2023   PROT 8.6 (H) 11/17/2023   ALBUMIN  3.1 (L) 11/23/2023   LABGLOB 5.1 (H) 12/17/2022   AGRATIO 0.7 11/20/2022   BILITOT 0.9 11/17/2023   ALKPHOS 78 11/17/2023   AST 27 11/17/2023   ALT 23 11/17/2023   ANIONGAP 23 (H) 11/23/2023    GFR: Estimated Creatinine Clearance: 7.2 mL/min (A) (by C-G formula based on SCr of 13.17 mg/dL (H)).  Recent Results (from the past 240 hours)  Respiratory (~20 pathogens) panel by PCR      Status: Abnormal   Collection Time: 11/16/23  2:32 PM   Specimen: Nasopharyngeal Swab; Respiratory  Result Value Ref Range Status   Adenovirus NOT DETECTED NOT DETECTED Final   Coronavirus 229E NOT DETECTED NOT DETECTED Final    Comment: (NOTE) The Coronavirus on the Respiratory Panel, DOES NOT test for the novel  Coronavirus (2019 nCoV)    Coronavirus HKU1 NOT DETECTED NOT DETECTED Final   Coronavirus NL63 NOT DETECTED NOT DETECTED Final   Coronavirus OC43 NOT DETECTED NOT DETECTED Final   Metapneumovirus NOT DETECTED NOT DETECTED Final   Rhinovirus / Enterovirus NOT DETECTED NOT DETECTED Final   Influenza A H1 2009 DETECTED (A) NOT DETECTED Final   Influenza B NOT DETECTED NOT DETECTED Final   Parainfluenza Virus 1 NOT DETECTED NOT DETECTED Final   Parainfluenza Virus 2 NOT DETECTED NOT DETECTED Final   Parainfluenza Virus 3 NOT DETECTED NOT DETECTED Final   Parainfluenza Virus 4 NOT DETECTED NOT DETECTED Final   Respiratory Syncytial Virus NOT DETECTED NOT DETECTED Final   Bordetella pertussis NOT DETECTED NOT DETECTED Final   Bordetella Parapertussis NOT DETECTED NOT DETECTED Final   Chlamydophila pneumoniae NOT DETECTED NOT DETECTED Final   Mycoplasma pneumoniae NOT DETECTED NOT DETECTED Final    Comment: Performed at [facility], [address] [place], [place] [address]  SARS Coronavirus 2 by RT PCR (hospital order, performed in [facility] hospital lab) *cepheid single result test* Anterior Nasal Swab     Status: None   Collection Time: 11/16/23  2:32 PM   Specimen: Anterior Nasal Swab  Result Value Ref Range Status   SARS Coronavirus 2 by RT PCR NEGATIVE NEGATIVE Final    Comment: Performed at [facility], [address] [place], [place] [address]  CSF culture w Gram Stain     Status: None   Collection Time: 11/19/23 11:34 AM   Specimen: CSF; Cerebrospinal Fluid  Result Value Ref Range Status   Specimen Description CSF  Final   Special Requests NONE   Final   Gram Stain   Final    WBC PRESENT, PREDOMINANTLY MONONUCLEAR NO ORGANISMS SEEN CYTOSPIN SMEAR    Culture   Final    NO GROWTH 3 DAYS Performed at [facility], [address], [place], [place] [address]    Report Status 11/23/2023 FINAL  Final      Radiology Studies: No results found.     LOS: 16 days    Elgin Lam, MD [facility] 11/24/2023, 11:31 AM   If 7PM-7AM, please contact night-coverage [email or web address]

## 2023-11-24 NOTE — Progress Notes (Signed)
 Progress Note  Patient Name: [name] CODRINGTON Sr. Date of Encounter: 11/24/2023 Primary Cardiologist: Jayson Sierras, MD   Subjective   Per primary team, patient is much more awake and engaged.  His encephalopathy has resolved.  Has new anemia.  Cardiology asked to re-engage in regard to timing of procedures.  63 yo M with a history of non obstructive CAD, NICM, HTN, HLD, DM, PAD s/p BKA and ESRD seen 12/23 for NSTEMI. Troponin of 8942 at that evaluation.    Patient notes the he is feeling back to normal.  He notes that back in November he was able to do ADLs with no issues.  Has a prosthesis and is able to mess with his dogs.  Vital Signs    Vitals:   11/24/23 0518 11/24/23 0552 11/24/23 0730 11/24/23 1240  BP: 99/66  (!) 109/57 (!) 108/57  Pulse: 88  67   Resp: 19  12 (!) 21  Temp: 97.7 F (36.5 C)  98 F (36.7 C) 97.6 F (36.4 C)  TempSrc: Oral  Oral Oral  SpO2: 94%  95% 95%  Weight:  97 kg 96.5 kg   Height:        Intake/Output Summary (Last 24 hours) at 11/24/2023 1256 Last data filed at 11/24/2023 1244 Gross per 24 hour  Intake 400 ml  Output 0 ml  Net 400 ml   Filed Weights   11/23/23 0836 11/24/23 0552 11/24/23 0730  Weight: 95.3 kg 97 kg 96.5 kg    Physical Exam   GEN: No acute distress.   Neck: No JVD Cardiac: RRR, no rubs, or gallops. Systolic crescendo murmur, Left arm fistula; he notes R arm fistula but I do not feel thrill Respiratory: Coarse breath sounds GI: Soft, nontender, non-distended  MS: R BKA  Labs   Telemetry: SR with PVCs and one episode of NSVT   Chemistry Recent Labs  Lab 11/20/23 0814 11/20/23 1214 11/22/23 1044 11/23/23 0840  NA 135  --  135 133*  K 5.1  --  4.4 4.6  CL 94*  --  92* 89*  CO2 18*  --  21* 21*  GLUCOSE 223*  --  200* 276*  BUN 64*  --  61* 78*  CREATININE 13.22*  --  11.72* 13.17*  CALCIUM  10.2  --  10.4* 10.1  ALBUMIN  3.0* 4.0 3.3* 3.1*  GFRNONAA 4*  --  4* 4*  ANIONGAP 23*  --  22* 23*      Hematology Recent Labs  Lab 11/20/23 0815 11/22/23 1044 11/23/23 0840  WBC 9.8 9.5 9.0  RBC 5.47 5.61 4.91  HGB 15.8 16.2 14.2  HCT 48.7 49.9 43.7  MCV 89.0 88.9 89.0  MCH 28.9 28.9 28.9  MCHC 32.4 32.5 32.5  RDW 14.9 16.7* 16.3*  PLT 444* 367 400    Cardiac EnzymesNo results for input(s): TROPONINI in the last 168 hours. No results for input(s): TROPIPOC in the last 168 hours.   BNPNo results for input(s): BNP, PROBNP in the last 168 hours.   DDimer No results for input(s): DDIMER in the last 168 hours.   Cardiac Studies   Cardiac Studies & Procedures      ECHOCARDIOGRAM  ECHOCARDIOGRAM COMPLETE 11/09/2023  Narrative ECHOCARDIOGRAM REPORT    Patient Name:   [name] TAVANO Sr. Date of Exam: 11/09/2023 Medical Rec #:  [id number]         Height:       73.0 in Accession #:    [id number]  Weight:       250.0 lb Date of Birth:  January 08, 1961        BSA:          2.366 m Patient Age:    62 years          BP:           126/56 mmHg Patient Gender: M                 HR:           89 bpm. Exam Location:  [facility]  Procedure: 2D Echo, Cardiac Doppler and Color Doppler  Indications:    Chest Pain R07.9  History:        Patient has prior history of Echocardiogram examinations, most recent 10/25/2023. Cardiomyopathy and CHF, Previous Myocardial Infarction and CAD; Risk Factors:Hypertension and Diabetes.  Sonographer:    Aida Pizza RCS Referring Phys: [phone] CHRISTOPHER RONALD BERGE  IMPRESSIONS   1. Left ventricular ejection fraction, by estimation, is 30 to 35%. The left ventricle has moderately decreased function. The left ventricle demonstrates regional wall motion abnormalities (see scoring diagram/findings for description). There is moderate concentric left ventricular hypertrophy. Left ventricular diastolic parameters are consistent with Grade III diastolic dysfunction (restrictive). 2. Right ventricular systolic function is mildly reduced. The  right ventricular size is normal. There is mildly elevated pulmonary artery systolic pressure. The estimated right ventricular systolic pressure is 42.2 mmHg. 3. Left atrial size was moderately dilated. 4. The mitral valve is degenerative. Trivial mitral valve regurgitation. Moderate mitral annular calcification. 5. The aortic valve is tricuspid. There is moderate calcification of the aortic valve. Aortic valve regurgitation is not visualized. Moderate to severe aortic valve stenosis - suspect upper end of scale, low flow/low gradient. Aortic valve area, by VTI measures 0.98 cm. Aortic valve mean gradient measures 22.0 mmHg. Dimentionless index 0.19. 6. Aortic dilatation noted. There is borderline dilatation of the aortic root, measuring 40 mm. 7. The inferior vena cava is dilated in size with <50% respiratory variability, suggesting right atrial pressure of 15 mmHg.  Comparison(s): Prior images reviewed side by side. LVEF has decreased, 30-35% range with basal inferoposterior akinesis and restrictive diastolic filling. Aortic stenosis possibly severe (low flow/low gradient). Mildly elevated RVSP.  FINDINGS Left Ventricle: Left ventricular ejection fraction, by estimation, is 30 to 35%. The left ventricle has moderately decreased function. The left ventricle demonstrates regional wall motion abnormalities. The left ventricular internal cavity size was normal in size. There is moderate concentric left ventricular hypertrophy. Left ventricular diastolic parameters are consistent with Grade III diastolic dysfunction (restrictive).   LV Wall Scoring: The inferior wall, posterior wall, and basal anterolateral segment are akinetic. The entire anterior wall, entire septum, entire apex, and mid anterolateral segment are hypokinetic.  Right Ventricle: The right ventricular size is normal. No increase in right ventricular wall thickness. Right ventricular systolic function is mildly reduced. There is  mildly elevated pulmonary artery systolic pressure. The tricuspid regurgitant velocity is 2.61 m/s, and with an assumed right atrial pressure of 15 mmHg, the estimated right ventricular systolic pressure is 42.2 mmHg.  Left Atrium: Left atrial size was moderately dilated.  Right Atrium: Right atrial size was normal in size.  Pericardium: There is no evidence of pericardial effusion.  Mitral Valve: The mitral valve is degenerative in appearance. There is mild thickening of the mitral valve leaflet(s). There is mild calcification of the mitral valve leaflet(s). Moderate mitral annular calcification. Trivial mitral valve regurgitation.  Tricuspid Valve:  The tricuspid valve is grossly normal. Tricuspid valve regurgitation is mild.  Aortic Valve: The aortic valve is tricuspid. There is moderate calcification of the aortic valve. There is moderate aortic valve annular calcification. Aortic valve regurgitation is not visualized. Moderate to severe aortic stenosis is present. Aortic valve mean gradient measures 22.0 mmHg. Aortic valve peak gradient measures 32.0 mmHg. Aortic valve area, by VTI measures 0.98 cm.  Pulmonic Valve: The pulmonic valve was grossly normal. Pulmonic valve regurgitation is trivial.  Aorta: Aortic dilatation noted. There is borderline dilatation of the aortic root, measuring 40 mm.  Venous: The inferior vena cava is dilated in size with less than 50% respiratory variability, suggesting right atrial pressure of 15 mmHg.  IAS/Shunts: No atrial level shunt detected by color flow Doppler.   LEFT VENTRICLE PLAX 2D LVIDd:         5.80 cm      Diastology LVIDs:         4.80 cm      LV e' medial:    4.68 cm/s LV PW:         1.40 cm      LV E/e' medial:  28.4 LV IVS:        1.40 cm      LV e' lateral:   5.33 cm/s LVOT diam:     2.60 cm      LV E/e' lateral: 25.0 LV SV:         57 LV SV Index:   24 LVOT Area:     5.31 cm  LV Volumes (MOD) LV vol d, MOD A2C: 169.0 ml LV  vol d, MOD A4C: 184.0 ml LV vol s, MOD A2C: 97.6 ml LV vol s, MOD A4C: 111.0 ml LV SV MOD A2C:     71.4 ml LV SV MOD A4C:     184.0 ml LV SV MOD BP:      72.1 ml  RIGHT VENTRICLE RV S prime:     6.31 cm/s TAPSE (M-mode): 1.4 cm  LEFT ATRIUM              Index        RIGHT ATRIUM           Index LA diam:        5.10 cm  2.16 cm/m   RA Area:     20.40 cm LA Vol (A2C):   105.0 ml 44.38 ml/m  RA Volume:   61.40 ml  25.95 ml/m LA Vol (A4C):   101.0 ml 42.69 ml/m LA Biplane Vol: 102.0 ml 43.12 ml/m AORTIC VALVE AV Area (Vmax):    1.04 cm AV Area (Vmean):   0.96 cm AV Area (VTI):     0.98 cm AV Vmax:           283.00 cm/s AV Vmean:          219.000 cm/s AV VTI:            0.583 m AV Peak Grad:      32.0 mmHg AV Mean Grad:      22.0 mmHg LVOT Vmax:         55.40 cm/s LVOT Vmean:        39.400 cm/s LVOT VTI:          0.108 m LVOT/AV VTI ratio: 0.19  AORTA Ao Root diam: 4.00 cm  MITRAL VALVE                TRICUSPID VALVE MV Area (  PHT): 6.17 cm     TR Peak grad:   27.2 mmHg MV Decel Time: 123 msec     TR Vmax:        261.00 cm/s MR Peak grad: 92.5 mmHg MR Mean grad: 54.0 mmHg     SHUNTS MR Vmax:      481.00 cm/s   Systemic VTI:  0.11 m MR Vmean:     334.0 cm/s    Systemic Diam: 2.60 cm MV E velocity: 133.00 cm/s MV A velocity: 66.90 cm/s MV E/A ratio:  1.99  Jayson Sierras MD Electronically signed by Jayson Sierras MD Signature Date/Time: 11/09/2023/1:41:56 PM    Final               Assessment & Plan   NSTEMI Non-obstructive CAD in the past Acute Anemia with GI Bleed - previously underwent cath in [facility] 2019 due to LV dysfunction with EF of 25%, cath showed 30% stenosis in prox LAD and distal RCA, medically managed.  - stress myoview 06/06/21 [facility]:Uyzmz is a moderate sized, severe, perfusion defect involving the apical, apical inferior, mid inferior, and basal inferior segments. The defects of the involved segments are nearly completely reversible.  This is consistent with probable ischemia.  - Hs trop peaked 8942 - Echo 11/10/2023 showed EF 30-35%, basal inferoposterior akinesis and restrictive diastolic filling, likely low-flow low gradient severe aortic stenosis  -a CT Chest this admission shows heavy three vessel coronary calcium .  In addition has a notable calcified valve - has completed 48 hours heparin  gtt, continue asa 81mg , lipitor 40mg ,  - I am confident he has underlying coronary artery disease; if he were to have multi vessel disease I am not sure he would be a candidate for CABG due to his comorbidity - He is at high risk for any interventions (coronary or otherwise); GI is concurrently seeing him for potential EGD Colonoscopy - if we performed LHC, I am unclear at this time if he would be cleared for DAPT given the new anemia - therefore, a reasonable plan may be for EGD/Colonoscopy (high risk) and if now bleeding diathesis and reasonable for cath, LHC and possible intervention (R femoral approach   Chronic HFrEF - LVEF dropped 30-35% on Echo  - unable proceed ischemic evaluation at this time  - GDMT: limited due to hypotension and ESRD - BP low on midodrine  support, volume as per nephrology/HD  NSVT - adding low dose BB today  ESRD Flu A  Type 2 DM  Hx of CVA  GI Bleed - per primary team   For questions or updates, please contact [facility] Please consult [email or web address] for contact info under Cardiology/STEMI.      Stanly Leavens, MD FASE [facility] Cardiologist [facility]  [address] [place], [address] [place], [place] [address] [phone]  12:56 PM

## 2023-11-25 ENCOUNTER — Ambulatory Visit (INDEPENDENT_AMBULATORY_CARE_PROVIDER_SITE_OTHER): Payer: Medicare Other

## 2023-11-25 DIAGNOSIS — N186 End stage renal disease: Secondary | ICD-10-CM | POA: Diagnosis not present

## 2023-11-25 DIAGNOSIS — R195 Other fecal abnormalities: Secondary | ICD-10-CM

## 2023-11-25 DIAGNOSIS — K922 Gastrointestinal hemorrhage, unspecified: Secondary | ICD-10-CM | POA: Diagnosis not present

## 2023-11-25 DIAGNOSIS — A0811 Acute gastroenteropathy due to Norwalk agent: Secondary | ICD-10-CM

## 2023-11-25 DIAGNOSIS — I214 Non-ST elevation (NSTEMI) myocardial infarction: Secondary | ICD-10-CM | POA: Diagnosis not present

## 2023-11-25 DIAGNOSIS — G9341 Metabolic encephalopathy: Secondary | ICD-10-CM | POA: Diagnosis not present

## 2023-11-25 LAB — GASTROINTESTINAL PANEL BY PCR, STOOL (REPLACES STOOL CULTURE)

## 2023-11-25 LAB — GLUCOSE, CAPILLARY
Glucose-Capillary: 292 mg/dL — ABNORMAL HIGH (ref 70–99)
Glucose-Capillary: 303 mg/dL — ABNORMAL HIGH (ref 70–99)
Glucose-Capillary: 163 mg/dL — ABNORMAL HIGH (ref 70–99)
Glucose-Capillary: 205 mg/dL — ABNORMAL HIGH (ref 70–99)

## 2023-11-25 LAB — BASIC METABOLIC PANEL
Anion gap: 17 — ABNORMAL HIGH (ref 5–15)
BUN: 63 mg/dL — ABNORMAL HIGH (ref 8–23)
CO2: 27 mmol/L (ref 22–32)
Calcium: 9.9 mg/dL (ref 8.9–10.3)
Chloride: 87 mmol/L — ABNORMAL LOW (ref 98–111)
Creatinine, Ser: 11.32 mg/dL — ABNORMAL HIGH (ref 0.61–1.24)
GFR, Estimated: 5 mL/min — ABNORMAL LOW (ref >60)
Glucose, Bld: 265 mg/dL — ABNORMAL HIGH (ref 70–99)
Potassium: 4.9 mmol/L (ref 3.5–5.1)
Sodium: 131 mmol/L — ABNORMAL LOW (ref 135–145)

## 2023-11-25 LAB — CBC
HCT: 42.7 % (ref 39.0–52.0)
Hemoglobin: 14 g/dL (ref 13.0–17.0)
MCH: 28.6 pg (ref 26.0–34.0)
MCHC: 32.8 g/dL (ref 30.0–36.0)
MCV: 87.3 fL (ref 80.0–100.0)
Platelets: 387 10*3/uL (ref 150–400)
RBC: 4.89 MIL/uL (ref 4.22–5.81)
RDW: 15.8 % — ABNORMAL HIGH (ref 11.5–15.5)
WBC: 8.9 10*3/uL (ref 4.0–10.5)
nRBC: 0 % (ref 0.0–0.2)

## 2023-11-25 MED ORDER — LIDOCAINE HCL (PF) 1 % IJ SOLN
5.0000 mL | INTRAMUSCULAR | Status: DC | PRN
Start: 2023-11-25 — End: 2023-11-25

## 2023-11-25 MED ORDER — LIDOCAINE-PRILOCAINE 2.5-2.5 % EX CREA
1.0000 | TOPICAL_CREAM | CUTANEOUS | Status: DC | PRN
Start: 2023-11-25 — End: 2023-11-25

## 2023-11-25 MED ORDER — PEG-KCL-NACL-NASULF-NA ASC-C 100 G PO SOLR: 0.5000 | Freq: Once | ORAL | Status: DC

## 2023-11-25 MED ORDER — ALTEPLASE 2 MG IJ SOLR: 2.0000 mg | Freq: Once | INTRAMUSCULAR | Status: DC | PRN

## 2023-11-25 MED ORDER — PEG-KCL-NACL-NASULF-NA ASC-C 100 G PO SOLR: 1.0000 | Freq: Once | ORAL | Status: DC

## 2023-11-25 MED ORDER — NEPRO/CARBSTEADY PO LIQD: 237.0000 mL | ORAL | Status: DC | PRN

## 2023-11-25 MED ORDER — INSULIN GLARGINE-YFGN 100 UNIT/ML ~~LOC~~ SOLN
10.0000 [IU] | Freq: Once | SUBCUTANEOUS | Status: AC
  Administered 2023-11-25: 10 [IU] via SUBCUTANEOUS
  Filled 2023-11-25: qty 0.1

## 2023-11-25 MED ORDER — ANTICOAGULANT SODIUM CITRATE 4% (200MG/5ML) IV SOLN: 5.0000 mL | Status: DC | PRN

## 2023-11-25 MED ORDER — PEG-KCL-NACL-NASULF-NA ASC-C 100 G PO SOLR
0.5000 | Freq: Once | ORAL | Status: DC
  Filled 2023-11-25: qty 1

## 2023-11-25 MED ORDER — SODIUM CHLORIDE 0.9 % IV SOLN: INTRAVENOUS | Status: DC

## 2023-11-25 MED ORDER — HEPARIN SODIUM (PORCINE) 1000 UNIT/ML DIALYSIS: 1000.0000 [IU] | INTRAMUSCULAR | Status: DC | PRN

## 2023-11-25 MED ORDER — HEPARIN SODIUM (PORCINE) 1000 UNIT/ML DIALYSIS
2000.0000 [IU] | Freq: Once | INTRAMUSCULAR | Status: AC
  Administered 2023-11-25: 2000 [IU] via INTRAVENOUS_CENTRAL

## 2023-11-25 MED ORDER — PENTAFLUOROPROP-TETRAFLUOROETH EX AERO: 1.0000 | INHALATION_SPRAY | CUTANEOUS | Status: DC | PRN

## 2023-11-25 MED ORDER — INSULIN GLARGINE-YFGN 100 UNIT/ML ~~LOC~~ SOLN
20.0000 [IU] | Freq: Every day | SUBCUTANEOUS | Status: DC
  Administered 2023-11-26 – 2023-11-27 (×2): 20 [IU] via SUBCUTANEOUS
  Filled 2023-11-25 (×2): qty 0.2

## 2023-11-25 NOTE — Progress Notes (Signed)
 Received patient in bed to unit.  Alert and oriented.  Informed consent signed and in chart.   TX duration:2:10  Patient tolerated well. Tx terminated per pt request. Early termination paper signed. Dr geralynn notified. Blood returned. Pt educated Transported back to the room  Alert, without acute distress.  Hand-off given to patient's nurse.   Access used: LAVF Access issues: NONE  Total UF removed: - Medication(s) given: HECTOROL    11/25/23 1509  Vitals  Temp 98.7 F (37.1 C)  Temp Source Oral  BP 105/62  MAP (mmHg) 76  BP Location Right Arm  BP Method Automatic  Patient Position (if appropriate) Lying  Pulse Rate 72  Pulse Rate Source Monitor  ECG Heart Rate 71  Resp 13  Oxygen Therapy  SpO2 99 %  O2 Device Room Air  During Treatment Monitoring  Blood Flow Rate (mL/min) 399 mL/min  Arterial Pressure (mmHg) -187.46 mmHg  Venous Pressure (mmHg) 218.17 mmHg  TMP (mmHg) -17.57 mmHg  Ultrafiltration Rate (mL/min) 0 mL/min  Dialysate Flow Rate (mL/min) 300 ml/min  Dialysate Potassium Concentration 2  Dialysate Calcium  Concentration 2.5  Duration of HD Treatment -hour(s) 2.17 hour(s)  Cumulative Fluid Removed (mL) per Treatment  -832.3  HD Safety Checks Performed Yes  Intra-Hemodialysis Comments  (tx terminated per pt request, early termination paper signed.blood returned to pt dr geralynn notified)  Dialysis Fluid Bolus Normal Saline  Bolus Amount (mL) 300 mL      Ronda Kazmi S [facility]

## 2023-11-25 NOTE — TOC Progression Note (Addendum)
 Transition of Care (TOC) - Progression Note    Patient Details  Name: [name] HASE Sr. MRN: [id number] Date of Birth: 03-Jan-1961  Transition of Care [facility]) CM/SW Contact  Luise JAYSON Pan, [place] Phone Number: 11/25/2023, 11:25 AM  Clinical Narrative:   8:40AM: CSW called pts dtr, [name] [name], about choice of SNF, [name] [name] chose [facility] for her father at this time. Per daily meeting with treatment team pt is now alert and oriented x4.  CSW met with pt to discuss dtrs choice of SNF. Pt is adamant on going to [facility] and instructed CSW to not contact his dtr regarding updates on dc plan.  CSW discussed pts capacity with treatment team, per MD, pt has capacity and can make his own decision on which SNF to go to.   CSW spoke with pt to encourage him to call his dtr to discuss choice of SNF. [name] [name] called his dtr while CSW was present and gave permission for CSW to say the family needs to discuss SNF decision. Per [name] [name] nothings changed and CSW asked for clarity on if pt wants to go to [facility]. Pt stated yes. Pt followed up with hey do not tell my dtr. CSW asked pt for clarity on if allowed to contact his daughter regarding dc plan to [facility]. Pt stated no. At this time, TOC will not contact dtr regarding dc plan per pts wishes.   11:37AM: CSW updated HD coordinator that pt will need to switch dialysis centers.   Expected Discharge Plan: Skilled Nursing Facility Barriers to Discharge: SNF Pending bed offer, Continued Medical Work up  Expected Discharge Plan and Services       Living arrangements for the past 2 months: Single Family Home                           HH Arranged:  (Active with [facility])           Social Determinants of Health (SDOH) Interventions SDOH Screenings   Food Insecurity: Patient Unable To Answer (11/10/2023)  Housing: Patient Unable To Answer (11/10/2023)  Transportation Needs: Unknown (11/10/2023)  Utilities: Patient Unable To Answer  (11/10/2023)  Alcohol Screen: Low Risk  (02/04/2022)  Depression (PHQ2-9): Low Risk  (08/06/2022)  Tobacco Use: Low Risk  (11/20/2023)    Readmission Risk Interventions    11/05/2022    3:08 PM  Readmission Risk Prevention Plan  Transportation Screening Complete  Medication Review (RN Care Manager) Complete  PCP or Specialist appointment within 3-5 days of discharge Complete  HRI or Home Care Consult Complete  SW Recovery Care/Counseling Consult Complete  Palliative Care Screening Complete  Skilled Nursing Facility Complete

## 2023-11-25 NOTE — Progress Notes (Addendum)
 Patient Name: [name] STGERMAINE Sr. Date of Encounter: 11/25/2023 [facility] Cardiologist: Jayson Sierras, MD  Interval Summary  .    Patient sleeping, arouses easily to voice. Denies chest pain, shortness of breath   Vital Signs .    Vitals:   11/25/23 0111 11/25/23 0542 11/25/23 0643 11/25/23 0820  BP: (!) 98/51 125/68  (!) 103/45  Pulse: 75 80  80  Resp: 16 17  12   Temp: 98 F (36.7 C) 97.7 F (36.5 C)  97.7 F (36.5 C)  TempSrc: Oral Oral  Oral  SpO2: 95% 95%  90%  Weight:   98.2 kg   Height:        Intake/Output Summary (Last 24 hours) at 11/25/2023 0928 Last data filed at 11/25/2023 9072 Gross per 24 hour  Intake 490 ml  Output 0 ml  Net 490 ml      11/25/2023    6:43 AM 11/24/2023    7:30 AM 11/24/2023    5:52 AM  Last 3 Weights  Weight (lbs) 216 lb 7.9 oz 212 lb 11.9 oz 213 lb 13.5 oz  Weight (kg) 98.2 kg 96.5 kg 97 kg      Telemetry/ECG    NSR, rare PVCs - Personally Reviewed  Physical Exam .   GEN: No acute distress.  Laying flat in the bed. Asleep, arouses easily to voice  Neck: No JVD Cardiac:  RRR. Grade 2/6 systolic murmur Respiratory: Clear to auscultation bilaterally. Normal WOB on room air  GI: Soft, nontender, non-distended  MS: No edema in BLE  Assessment & Plan .     NSTEMI  Non-obstructive CAD in the past  Acute Anemia with GI bleed  - Patient previously had cardiac catheterization in [place] in 2019 due to LV dysfunction (EF 25%)- found to have 30% stenosis in prox LAD and distal RCA. Was managed medically at the time  - Later, underwent nuclear stress test in 05/[facility]- There is a moderate sized, severe, perfusion defect involving the apical, apical inferior, mid inferior, and basal inferior segments. The defects of the involved segments are nearly completely reversible. This is consistent with probable ischemia. He was again managed medically  - Now, patient presented after having near syncope during HD. Hs trop peaked 40.  He has completed 48 hours IV Heparin . Continue ASA 81 mg daily and lipitor 40 mg daily   - Echo with EF 30-35% and regional wall motion abnormalities  - Highly suspect patient that patient has underlying coronary artery disease. However, if he does nave multivessel disease, I am not sure he would be a candidate for CABG due to his comorbities  - Patient is high risk for any intervention - cardiac or otherwise. GI considering EGD colonoscopy  - With patient's anemia, unclear if he would be able to tolerate DAPT - A reasonable plan would be for EGD/Colonoscopy (though high risk). When GI workup complete and anemia/bleeding resolves, could then proceed with L/RHC and intervention/DAPT   Chronic HFrEF  Moderate-severe aortic valve stenosis  - Echocardiogram 11/09/23 showed EF 30-35% with regional wall motion abnormalities, grade III DD, mildly reduced RV systolic function, moderate-severe aortic valve stenosis  - As above, could consider L/R heart catheterization after GI work up complete.  - GDMT limited by low BP, ESRD  - On midodrine  for BP support  - Added low dose BB yesterday for NSVT - metoprolol  tartrate 12.5 mg BID. BP tolerated   NSVT  - Improved after addition of BB-  now only rare PVCs on tele   Otherwise per primary  - ESRD  - Flu A - Type 2 DM  - History of CVA  - GI bleed   For questions or updates, please contact [facility] Please consult [email or web address] for contact info under        Signed, Rollo FABIENE Louder, PA-C  Personally seen and examined. Agree with APP above with the following comments: Patient feels well. No further NSVT. Anxious to get colonoscopy.  Very Anxious to get better. Patient has R BKA.  Exam is otherwise as above. BP is lower on BB but he has tolerated it well without further NSVT. Planned for high risk colo/EGD; then if cleared for DAPT would pursue LHC (potentially just diagnostic)  Stanly Leavens, MD FASE  [facility] Cardiologist [facility]  [address] [place], [address] [place], [place] [address] [phone]  12:05 PM

## 2023-11-25 NOTE — Plan of Care (Signed)
  Problem: Education: Goal: Knowledge of General Education information will improve Description: Including pain rating scale, medication(s)/side effects and non-pharmacologic comfort measures Outcome: Progressing   Problem: Health Behavior/Discharge Planning: Goal: Ability to manage health-related needs will improve Outcome: Progressing   Problem: Clinical Measurements: Goal: Ability to maintain clinical measurements within normal limits will improve Outcome: Progressing Goal: Will remain free from infection Outcome: Progressing Goal: Diagnostic test results will improve Outcome: Progressing Goal: Respiratory complications will improve Outcome: Progressing Goal: Cardiovascular complication will be avoided Outcome: Progressing   Problem: Activity: Goal: Risk for activity intolerance will decrease Outcome: Progressing   Problem: Nutrition: Goal: Adequate nutrition will be maintained Outcome: Progressing   Problem: Coping: Goal: Level of anxiety will decrease Outcome: Progressing   Problem: Elimination: Goal: Will not experience complications related to bowel motility Outcome: Progressing Goal: Will not experience complications related to urinary retention Outcome: Progressing   Problem: Pain Management: Goal: General experience of comfort will improve Outcome: Progressing   Problem: Safety: Goal: Ability to remain free from injury will improve Outcome: Progressing   Problem: Skin Integrity: Goal: Risk for impaired skin integrity will decrease Outcome: Progressing   Problem: Education: Goal: Ability to describe self-care measures that may prevent or decrease complications (Diabetes Survival Skills Education) will improve Outcome: Progressing Goal: Individualized Educational Video(s) Outcome: Progressing   Problem: Coping: Goal: Ability to adjust to condition or change in health will improve Outcome: Progressing   Problem: Fluid Volume: Goal: Ability to  maintain a balanced intake and output will improve Outcome: Progressing   Problem: Health Behavior/Discharge Planning: Goal: Ability to identify and utilize available resources and services will improve Outcome: Progressing Goal: Ability to manage health-related needs will improve Outcome: Progressing   Problem: Metabolic: Goal: Ability to maintain appropriate glucose levels will improve Outcome: Progressing   Problem: Nutritional: Goal: Maintenance of adequate nutrition will improve Outcome: Progressing Goal: Progress toward achieving an optimal weight will improve Outcome: Progressing   Problem: Skin Integrity: Goal: Risk for impaired skin integrity will decrease Outcome: Progressing   Problem: Tissue Perfusion: Goal: Adequacy of tissue perfusion will improve Outcome: Progressing   Problem: Safety: Goal: Non-violent Restraint(s) Outcome: Progressing

## 2023-11-25 NOTE — Inpatient Diabetes Management (Signed)
 Inpatient Diabetes Program Recommendations  AACE/ADA: New Consensus Statement on Inpatient Glycemic Control (2015)  Target Ranges:  Prepandial:   less than 140 mg/dL      Peak postprandial:   less than 180 mg/dL (1-2 hours)      Critically ill patients:  140 - 180 mg/dL   Lab Results  Component Value Date   GLUCAP 303 (H) 11/25/2023   HGBA1C >15.5 (H) 10/24/2023    Review of Glycemic Control  Latest Reference Range & Units 11/24/23 12:42 11/24/23 16:05 11/24/23 20:52 11/25/23 05:46 11/25/23 08:21  Glucose-Capillary 70 - 99 mg/dL 719 (H) 682 (H) 685 (H) 292 (H) 303 (H)  (H): Data is abnormally high Diabetes history: Type 2 DM Outpatient Diabetes medications: Novolog  70/30 30 units QA, 25 units P Current orders for Inpatient glycemic control:  Novolog  0-9 units TID & HS, Semglee  10 units every day   Inpatient Diabetes Program Recommendations:     Consider further increasing Semglee  to 18 units every day.   Thanks, Tinnie Minus, MSN, RNC-OB Diabetes Coordinator [phone] (8a-5p)

## 2023-11-25 NOTE — Progress Notes (Addendum)
 Contacted by CSW with request for pt's HD clinic to be changed to a [facility] clinic while pt at snf for rehab. Referral submitted to [facility] admissions today for review. Pt will need to be able to sit for 4-5 hrs in a chair to be appropriate for out-pt HD at d/c. Inquired of team if pt will need to trial HD in chair prior to d/c. Per staff, pt is weak and pt will need to work toward getting out of bed to see how pt tolerates. Team aware of this info. Will assist as needed.   Randine Mungo Renal Navigator [phone]

## 2023-11-25 NOTE — Progress Notes (Signed)
 Patient ID: [name] [name], male   DOB: 1961/06/22, 63 y.o.   MRN: [id number] S: Seen in HD, no c/o's. BP's low in HD.   O:BP 108/61 (BP Location: Right Arm)   Pulse 72   Temp 97.8 F (36.6 C) (Oral)   Resp 15   Ht 6' 1 (1.854 m)   Wt 99 kg Comment: bed  SpO2 92%   BMI 28.80 kg/m   Intake/Output Summary (Last 24 hours) at 11/25/2023 1636 Last data filed at 11/25/2023 1512 Gross per 24 hour  Intake 630 ml  Output -800 ml  Net 1430 ml   Intake/Output: I/O last 3 completed shifts: In: 440 [P.O.:440] Out: 0   Intake/Output this shift:  Total I/O In: 530 [P.O.:530] Out: -800  Weight change: 1.2 kg Gen: NAD CVS: RRR Resp:CTA Abd: +BS, soft, NT/ND Ext: s/p R BKA, no edema, LUE AVF +T/B  Recent Labs  Lab 11/20/23 0814 11/20/23 1214 11/22/23 1044 11/23/23 0840 11/25/23 0257  NA 135  --  135 133* 131*  K 5.1  --  4.4 4.6 4.9  CL 94*  --  92* 89* 87*  CO2 18*  --  21* 21* 27  GLUCOSE 223*  --  200* 276* 265*  BUN 64*  --  61* 78* 63*  CREATININE 13.22*  --  11.72* 13.17* 11.32*  ALBUMIN  3.0* 4.0 3.3* 3.1*  --   CALCIUM  10.2  --  10.4* 10.1 9.9  PHOS 6.5*  --  7.2* 8.0*  --    Liver Function Tests: Recent Labs  Lab 11/20/23 1214 11/22/23 1044 11/23/23 0840  ALBUMIN  4.0 3.3* 3.1*   No results for input(s): LIPASE, AMYLASE in the last 168 hours. No results for input(s): AMMONIA in the last 168 hours.  CBC: Recent Labs  Lab 11/20/23 0815 11/22/23 1044 11/23/23 0840 11/25/23 1108  WBC 9.8 9.5 9.0 8.9  HGB 15.8 16.2 14.2 14.0  HCT 48.7 49.9 43.7 42.7  MCV 89.0 88.9 89.0 87.3  PLT 444* 367 400 387   Cardiac Enzymes: No results for input(s): CKTOTAL, CKMB, CKMBINDEX, TROPONINI in the last 168 hours. CBG: Recent Labs  Lab 11/24/23 1605 11/24/23 2052 11/25/23 0546 11/25/23 0821 11/25/23 1600  GLUCAP 317* 314* 292* 303* 163*    Iron  Studies:  Recent Labs    11/24/23 1306  IRON  46  TIBC 294  FERRITIN 421*   Studies/Results: No  results found.   aspirin   81 mg Oral Daily   atorvastatin   40 mg Oral Daily   Chlorhexidine  Gluconate Cloth  6 each Topical Q0600   cinacalcet   30 mg Oral Q breakfast   doxercalciferol   3 mcg Intravenous Q M,W,F-HD   feeding supplement  237 mL Oral BID BM   Gerhardt's butt cream   Topical BID   insulin  aspart  0-5 Units Subcutaneous QHS   insulin  aspart  0-9 Units Subcutaneous TID WC   insulin  glargine-yfgn  10 Units Subcutaneous Once   [START ON 11/26/2023] insulin  glargine-yfgn  20 Units Subcutaneous Daily   metoprolol  tartrate  12.5 mg Oral BID   midodrine   10 mg Oral TID WC   multivitamin  1 tablet Oral QHS   sevelamer  carbonate  4,000 mg Oral TID WC   thiamine  (VITAMIN B1) injection  100 mg Intravenous Daily   Or   thiamine   100 mg Oral Daily    BMET    Component Value Date/Time   NA 131 (L) 11/25/2023 0257   K 4.9 11/25/2023 0257  CL 87 (L) 11/25/2023 0257   CO2 27 11/25/2023 0257   GLUCOSE 265 (H) 11/25/2023 0257   BUN 63 (H) 11/25/2023 0257   CREATININE 11.32 (H) 11/25/2023 0257   CALCIUM  9.9 11/25/2023 0257   GFRNONAA 5 (L) 11/25/2023 0257   GFRAA 3 (L) 08/14/2019 2051   CBC    Component Value Date/Time   WBC 8.9 11/25/2023 1108   RBC 4.89 11/25/2023 1108   HGB 14.0 11/25/2023 1108   HGB 13.0 12/17/2022 1242   HCT 42.7 11/25/2023 1108   PLT 387 11/25/2023 1108   PLT 272 12/17/2022 1242   MCV 87.3 11/25/2023 1108   MCH 28.6 11/25/2023 1108   MCHC 32.8 11/25/2023 1108   RDW 15.8 (H) 11/25/2023 1108   LYMPHSABS 1.3 11/18/2023 1515   MONOABS 1.7 (H) 11/18/2023 1515   EOSABS 0.3 11/18/2023 1515   BASOSABS 0.1 11/18/2023 1515    Outpt Dialysis Orders:  Center: [facility] Iv MWF 4h  450/700  105kg   2/2 bath   AVF - Heparin  7000 + 2000 mid run - Hectorol  7 mcg IV three times per week   Assessment/Plan: 1.AMS: Was on cefepime  which has been stopped. Per primary team some concern for encephalitis/meningitis, was on empiric  antibiotics/antivirals until seen by ID on 11/18/23 and all was stopped.  S/P LP which ruled out bacterail meningitis/encephalitis. He did test + for flu A by resp panel. MRI without acute abnormality.  ID stopped abx and antivirals and signed off. Getting trial of high-dose thiamine . Mental status has sig improved.  2.  ESRD - usual HD is MWF. HD today.  3. NSTEMI- work up per cardiology/primary.  4. Volume - is under dry wt by 8kg. Didn't tolerated any UF w/ HD today, BP's stayed in the 80s-90s until rinseback. No further dry wt lowering while here.  5. Fever/Hypotension- BC remain no growth. Was started on board spectrum antibiotics/antivirals but now discontinued.  ID signed off. 6. HFrEF/history of severe AS- attempting to optimize volume with dialysis. ECHO  EF 30 to 35%. Seen by Cards. Fluid restrictions and daily weights. On midodrine  10 mg 3 times daily.  7. Anemia  - Hgb above goal, no ESA indicated at this time.  8. Metabolic bone disease -Phos at goal. Continue renvela . VDRA on hold d/t high calcium ; coming down. 9. Dispo - pending    [name]  MD  [facility] 11/25/2023, 4:36 PM  Recent Labs  Lab 11/22/23 1044 11/23/23 0840 11/25/23 0257 11/25/23 1108  HGB 16.2 14.2  --  14.0  ALBUMIN  3.3* 3.1*  --   --   CALCIUM  10.4* 10.1 9.9  --   PHOS 7.2* 8.0*  --   --   CREATININE 11.72* 13.17* 11.32*  --   K 4.4 4.6 4.9  --     Inpatient medications:  aspirin   81 mg Oral Daily   atorvastatin   40 mg Oral Daily   Chlorhexidine  Gluconate Cloth  6 each Topical Q0600   cinacalcet   30 mg Oral Q breakfast   doxercalciferol   3 mcg Intravenous Q M,W,F-HD   feeding supplement  237 mL Oral BID BM   Gerhardt's butt cream   Topical BID   insulin  aspart  0-5 Units Subcutaneous QHS   insulin  aspart  0-9 Units Subcutaneous TID WC   insulin  glargine-yfgn  10 Units Subcutaneous Once   [START ON 11/26/2023] insulin  glargine-yfgn  20 Units Subcutaneous Daily   metoprolol  tartrate  12.5 mg Oral BID  midodrine   10 mg Oral TID WC   multivitamin  1 tablet Oral QHS   sevelamer  carbonate  4,000 mg Oral TID WC   thiamine  (VITAMIN B1) injection  100 mg Intravenous Daily   Or   thiamine   100 mg Oral Daily    sodium chloride      acetaminophen  **OR** acetaminophen , ondansetron  **OR** ondansetron  (ZOFRAN ) IV, mouth rinse, mouth rinse, mouth rinse, sevelamer  carbonate

## 2023-11-25 NOTE — Progress Notes (Signed)
 SLP Cancellation Note  Patient Details Name: [name] DAUGHTRIDGE Sr. MRN: [id number] DOB: 1961/02/13   Cancelled treatment:       Reason Eval/Treat Not Completed: Patient at procedure or test/unavailable (HD). SLP will continue following.   Damien Blumenthal, M.A., CF-SLP Speech Language Pathology, Acute Rehabilitation Services  Secure Chat preferred [phone]  11/25/2023, 1:17 PM

## 2023-11-25 NOTE — Progress Notes (Signed)
 Physical Therapy Treatment Patient Details Name: [name] BIELER Sr. MRN: [id number] DOB: 11/05/1961 Today's Date: 11/25/2023   History of Present Illness Pt is 63 year old presented to [facility] on  11/08/23 after experiencing a near syncope during HD. Pt being worked up for an NSTEMI with planned LHC and RHC possibly 11/16/23, and fever with acute metabolic encephalopathy.  Pt recently admitted 12/6-12/10/24 for a fall. PMH - htn, cardiomyopathy, CHF, dm, esrd on hd, R BKA, CVA    PT Comments  Patient resting in bed and sleeping, with moderate verbal/tactile cues pt alerted and agreeable to mobilize with encouragement. Pt voicing recent BM and need for pericare, Mod assist to roll Rt and Max assist for hygiene. Pt perseverating on back itching and requesting therapist scratch his back. Mod assist to raise trunk up from Lt sidelying to sit EOB. Pt with improved balance of trunk in sitting however continues to have difficulty maintaining upright head posture. Patient tolerated sitting up EOB for ~8 minutes and then returned to Lt sidelying abruptly with Mod-Max assist to control lowering trunk and raising LE's onto bed. EOS pt able to use Rt UE to assist with superior boost in bed when placed in trendelenburg and using headboard. Repositioned for comfort in supine. EOS Alarm on and call bell within reach. Will continue to progress pt as able.     If plan is discharge home, recommend the following: Two people to help with walking and/or transfers;Two people to help with bathing/dressing/bathroom;Assistance with cooking/housework;Assistance with feeding;Direct supervision/assist for medications management;Direct supervision/assist for financial management;Assist for transportation;Help with stairs or ramp for entrance;Supervision due to cognitive status   Can travel by private vehicle        Equipment Recommendations   (TBD at next venue)    Recommendations for Other Services       Precautions /  Restrictions Precautions Precautions: Fall Required Braces or Orthoses: Other Brace Other Brace: R prosthetic (not sure if fitting) Restrictions Weight Bearing Restrictions Per Provider Order: No Other Position/Activity Restrictions: R BKA     Mobility  Bed Mobility Overal bed mobility: Needs Assistance Bed Mobility: Rolling, Sidelying to Sit, Sit to Sidelying Rolling: Min assist Sidelying to sit: Mod assist, HOB elevated, Used rails     Sit to sidelying: Mod assist, Used rails, HOB elevated General bed mobility comments: pt with improved initiation to reach UE's to bed rail, greater activation with Rt UE compared to Lt.  Mod assist to raise trunk and stabilize balance initially fading to CGA. Mod assits +2 to return to supine and pt using Rt UE and +2 assist to boost superiorly.    Transfers                        Ambulation/Gait                   Stairs             Wheelchair Mobility     Tilt Bed    Modified Rankin (Stroke Patients Only)       Balance Overall balance assessment: Needs assistance Sitting-balance support: Feet supported, Bilateral upper extremity supported Sitting balance-Leahy Scale: Poor Sitting balance - Comments: mod assist at start fading to CGA for safety, pt with bil UE support to stabilize balance.  Cognition Arousal: Alert Behavior During Therapy: WFL for tasks assessed/performed Overall Cognitive Status: Impaired/Different from baseline                     Current Attention Level: Sustained Memory: Decreased short-term memory Following Commands: Follows one step commands inconsistently, Follows one step commands with increased time Safety/Judgement: Decreased awareness of safety, Decreased awareness of deficits Awareness: Intellectual Problem Solving: Slow processing, Decreased initiation, Requires verbal cues, Difficulty sequencing           Exercises      General Comments        Pertinent Vitals/Pain Pain Assessment Pain Assessment: Faces Faces Pain Scale: Hurts a little bit Pain Location: Lt hand (pt able to tolerate WB on Lt UE) Pain Descriptors / Indicators: Guarding Pain Intervention(s): Limited activity within patient's tolerance, Monitored during session, Repositioned    Home Living                          Prior Function            PT Goals (current goals can now be found in the care plan section) Acute Rehab PT Goals PT Goal Formulation: Patient unable to participate in goal setting Time For Goal Achievement: 11/28/23 Potential to Achieve Goals: Fair Progress towards PT goals: Progressing toward goals    Frequency    Min 1X/week      PT Plan      Co-evaluation              AM-PAC PT 6 Clicks Mobility   Outcome Measure  Help needed turning from your back to your side while in a flat bed without using bedrails?: A Lot Help needed moving from lying on your back to sitting on the side of a flat bed without using bedrails?: A Lot Help needed moving to and from a bed to a chair (including a wheelchair)?: Total Help needed standing up from a chair using your arms (e.g., wheelchair or bedside chair)?: Total Help needed to walk in hospital room?: Total Help needed climbing 3-5 steps with a railing? : Total 6 Click Score: 8    End of Session   Activity Tolerance: Patient tolerated treatment well;Patient limited by lethargy Patient left: in bed;with call bell/phone within reach;with bed alarm set Nurse Communication: Need for lift equipment;Mobility status PT Visit Diagnosis: Muscle weakness (generalized) (M62.81);Difficulty in walking, not elsewhere classified (R26.2);Pain Pain - Right/Left: Left Pain - part of body: Hand     Time: 1030-1052 PT Time Calculation (min) (ACUTE ONLY): 22 min  Charges:    $Therapeutic Activity: 8-22 mins PT General Charges $$ ACUTE PT  VISIT: 1 Visit                     Vernell DONEEN KLEIN, DPT Acute Rehabilitation Services Office [phone]  11/25/23 5:47 PM

## 2023-11-25 NOTE — Progress Notes (Signed)
 There was the consult for placing a PIV access. This patient needs a PIV access for the procedures ( EGD & cardiac cath) however asked patient's RN regarding this matter, not sure when patient is going to procedure. Talked to Dr. Arlice regarding this matter, recommended to put in a PIV access same day of procedure. Dr. Arlice agreed and no PIV access at this time is okay.  [facility] [employer]

## 2023-11-25 NOTE — Progress Notes (Signed)
 PROGRESS NOTE  [name] WEISINGER Sr.  DOB: Dec 31, 1960  PCP: Freddrick Johns FMW:[id number]  DOA: 11/08/2023  LOS: 17 days  Hospital Day: 18  Brief narrative: [name] [name] is a 63 y.o. male with PMH significant for ESRD-HD-MWF, DM2, HTN, nonischemic cardiomyopathy, mild systolic CHF, prior right BKA 87/77, patient was brought to the ED after experiencing an episode of chest pain, hypotension and near syncope during hemodialysis.  His initial blood pressure was low in 75/59 On further workup in the ED, troponin was significantly elevated and peaked at 9000 He was diagnosed with NSTEMI, started on heparin  drip. Admitted to [facility]  Seen by cardiology  See below for details  Hospital course was also complicated altered mental status.  Found to have influenza A positive Cardiology reengaged on 1/7  Subjective: Patient was seen and examined this morning.  Pleasant middle-aged African-American male.  Lying on bed.  Not in distress.  Family in the phone Chart reviewed. In the last 24 hours, patient is afebrile, heart rate in 80s, blood pressure in 100s, breathing on room air Blood glucose level running elevated mostly over 300  Assessment and plan: NSTEMI Presented with chest pain, hypertension, syncope, significantly elevated troponin  12/23 echocardiogram showed EF low at 30 to 35% with regional WMA, moderate to severe aortic stenosis Initially started on medical management with IV heparin  drip, aspirin  and Lipitor Cardiology wanted to hold off on further invasive study until mental status improved. Cardiology was reconsulted on 1/7. Currently on aspirin , Lipitor. Tentative plan of cardiac intervention after GI intervention.  Acute exacerbation of combined systolic and diastolic CHF Moderate to severe aortic stenosis Echo from 10/1811/23 compared.  Noted a drop in EF from 45-50% to 30-35%, along with grade 3 diastolic dysfunction as well as moderate to severe aortic valve  stenosis. Cardiology following Currently on metoprolol  12.5 mg twice daily and midodrine  10 mg 3 times daily Also on dialysis for fluid management  ESRD-HD-MWF Nephrology on board  Influenza A positive Detected on 12/30.  Completed the course of Tamiflu   Diarrhea C. difficile negative. Possibly antibiotic related Antibiotics stopped.  Diarrhea improved  Occult positive stool Unclear etiology. Hemoglobin is currently stable but patient complains of recurrent bloody stools. [facility] consulted in anticipation of DAPT once patient gets cardiac cath. Recent Labs    12/17/22 1244 12/17/22 1245 10/22/23 2343 11/16/23 1518 11/17/23 0245 11/18/23 1515 11/20/23 0815 11/22/23 1044 11/23/23 0840 11/24/23 1306 11/25/23 1108  HGB  --   --    < >  --    < > 15.8 15.8 16.2 14.2  --  14.0  MCV  --   --    < >  --    < > 86.7 89.0 88.9 89.0  --  87.3  VITAMINB12  --   --   --  2,058*  --   --   --   --   --   --   --   FOLATE  --   --   --  16.6  --   --   --   --   --   --   --   FERRITIN 1,481*  --   --   --   --   --   --   --   --  421*  --   TIBC  --  190*  --   --   --   --   --   --   --  294  --  IRON   --  72  --   --   --   --   --   --   --  46  --    < > = values in this interval not displayed.   Acute metabolic encephalopathy Patient hospital stay was complicated by altered mental status.  There was initial concern for encephalitis vs meningitis. Neurology was consulted.  Patient while started empirically on broad-spectrum antibiotics, antiviral. 12/27 and 1/1, EEG did not show any evidence of seizure activity. 1/3, MRI brain did not any acute abnormality. 1/3, lumbar puncture done. CSF labs significant for elevated glucose and protein, not consistent with meningitis; Meningitis/encephalitis panel negative for pathogen. Antibiotics were stopped. Neurology recommendation for continued supportive care with anticipation that patient will have a prolonged recovery of  encephalopathy related to hyperglycemia.  ID and neurology signed off. Mental status gradually improved   Type 2 diabetes mellitus uncontrolled A1c more than 15.51 10/24/2023 Currently on Semglee  10 units daily SSI/Accu-Cheks.  Blood sugar level running elevated over 300 mostly. I would increase Semglee  to 20 units daily.  Continue monitor Recent Labs  Lab 11/24/23 1242 11/24/23 1605 11/24/23 2052 11/25/23 0546 11/25/23 0821  GLUCAP 280* 317* 314* 292* 303*   H/o CVA Continue ASA, and Lipitor   Mobility: PT eval obtained.  SNF recommended  Goals of care   Code Status: Full Code     DVT prophylaxis:  Place and maintain sequential compression device Start: 11/20/23 0706 SCDs Start: 11/09/23 1558   Antimicrobials: Completed a course Fluid: None Consultants: GI, cardiology, nephrology Family Communication: Family on the phone  Status: Inpatient Level of care:  Progressive   Patient is from: Home Needs to continue in-hospital care: Pending procedures Anticipated d/c to: SNF recommended by PT  Diet:  Diet Order             Diet clear liquid Fluid consistency: Thin  Diet effective now                   Scheduled Meds:  aspirin   81 mg Oral Daily   atorvastatin   40 mg Oral Daily   Chlorhexidine  Gluconate Cloth  6 each Topical Q0600   cinacalcet   30 mg Oral Q breakfast   doxercalciferol   3 mcg Intravenous Q M,W,F-HD   feeding supplement  237 mL Oral BID BM   Gerhardt's butt cream   Topical BID   insulin  aspart  0-5 Units Subcutaneous QHS   insulin  aspart  0-9 Units Subcutaneous TID WC   insulin  glargine-yfgn  10 Units Subcutaneous Once   [START ON 11/26/2023] insulin  glargine-yfgn  20 Units Subcutaneous Daily   metoprolol  tartrate  12.5 mg Oral BID   midodrine   10 mg Oral TID WC   multivitamin  1 tablet Oral QHS   sevelamer  carbonate  4,000 mg Oral TID WC   thiamine  (VITAMIN B1) injection  100 mg Intravenous Daily   Or   thiamine   100 mg Oral Daily     PRN meds: acetaminophen  **OR** acetaminophen , alteplase , anticoagulant sodium citrate , feeding supplement (NEPRO CARB STEADY), heparin , lidocaine  (PF), lidocaine -prilocaine , ondansetron  **OR** ondansetron  (ZOFRAN ) IV, mouth rinse, mouth rinse, mouth rinse, pentafluoroprop-tetrafluoroeth, sevelamer  carbonate   Infusions:   anticoagulant sodium citrate       Antimicrobials: Anti-infectives (From admission, onward)    Start     Dose/Rate Route Frequency Ordered Stop   11/18/23 1800  oseltamivir  (TAMIFLU ) capsule 30 mg        30 mg Oral Every M-W-F (  Hemodialysis) 11/18/23 1116 11/23/23 1159   11/17/23 1800  oseltamivir  (TAMIFLU ) capsule 30 mg  Status:  Discontinued        30 mg Oral Every T-Th-Sa (1800) 11/17/23 0800 11/18/23 1116   11/17/23 1115  vancomycin  (VANCOCIN ) IVPB 1000 mg/200 mL premix  Status:  Discontinued        1,000 mg 200 mL/hr over 60 Minutes Intravenous Once in dialysis 11/17/23 1021 11/18/23 1331   11/17/23 1000  oseltamivir  (TAMIFLU ) capsule 30 mg        30 mg Oral  Once 11/17/23 0759 11/17/23 0952   11/16/23 1200  vancomycin  (VANCOCIN ) IVPB 1000 mg/200 mL premix  Status:  Discontinued        1,000 mg 200 mL/hr over 60 Minutes Intravenous Every M-W-F (Hemodialysis) 11/15/23 1658 11/18/23 1306   11/12/23 2300  vancomycin  (VANCOCIN ) IVPB 1000 mg/200 mL premix        1,000 mg 200 mL/hr over 60 Minutes Intravenous  Once 11/12/23 2226 11/13/23 0131   11/12/23 2000  cefTRIAXone  (ROCEPHIN ) 2 g in sodium chloride  0.9 % 100 mL IVPB  Status:  Discontinued        2 g 200 mL/hr over 30 Minutes Intravenous Every 12 hours 11/12/23 1627 11/18/23 1306   11/12/23 2000  ampicillin  (OMNIPEN) 2 g in sodium chloride  0.9 % 100 mL IVPB  Status:  Discontinued        2 g 300 mL/hr over 20 Minutes Intravenous Every 12 hours 11/12/23 1627 11/18/23 1306   11/12/23 1730  acyclovir  (ZOVIRAX ) 450 mg in dextrose  5 % 100 mL IVPB  Status:  Discontinued        5 mg/kg  89.6 kg (Adjusted) 109  mL/hr over 60 Minutes Intravenous Every 24 hours 11/12/23 1627 11/18/23 1306   11/11/23 1915  piperacillin -tazobactam (ZOSYN ) IVPB 2.25 g  Status:  Discontinued        2.25 g 100 mL/hr over 30 Minutes Intravenous Every 8 hours 11/11/23 1825 11/12/23 1615   11/11/23 1245  metroNIDAZOLE  (FLAGYL ) IVPB 500 mg  Status:  Discontinued        500 mg 100 mL/hr over 60 Minutes Intravenous Every 12 hours 11/11/23 1146 11/11/23 1825   11/11/23 1200  vancomycin  (VANCOCIN ) IVPB 1000 mg/200 mL premix  Status:  Discontinued        1,000 mg 200 mL/hr over 60 Minutes Intravenous Every M-W-F (Hemodialysis) 11/10/23 1458 11/15/23 1658   11/10/23 1545  vancomycin  (VANCOCIN ) IVPB 1000 mg/200 mL premix       Placed in Followed by Linked Group   1,000 mg 200 mL/hr over 60 Minutes Intravenous  Once 11/10/23 1458 11/10/23 1917   11/10/23 1545  vancomycin  (VANCOCIN ) IVPB 1000 mg/200 mL premix       Placed in Followed by Linked Group   1,000 mg 200 mL/hr over 60 Minutes Intravenous  Once 11/10/23 1458 11/10/23 1815   11/10/23 1545  ceFEPIme  (MAXIPIME ) 1 g in sodium chloride  0.9 % 100 mL IVPB  Status:  Discontinued        1 g 200 mL/hr over 30 Minutes Intravenous Every M-W-F 11/10/23 1458 11/10/23 1520   11/10/23 1545  ceFEPIme  (MAXIPIME ) 2 g in sodium chloride  0.9 % 100 mL IVPB  Status:  Discontinued        2 g 200 mL/hr over 30 Minutes Intravenous Every M-W-F 11/10/23 1520 11/11/23 1810       Objective: Vitals:   11/25/23 1350 11/25/23 1400  BP: (!) 80/51 (!) 82/43  Pulse:  70 67  Resp: 12 (!) 21  Temp:    SpO2: 97% 96%    Intake/Output Summary (Last 24 hours) at 11/25/2023 1422 Last data filed at 11/25/2023 1100 Gross per 24 hour  Intake 630 ml  Output 0 ml  Net 630 ml   Filed Weights   11/24/23 0730 11/25/23 0643 11/25/23 1240  Weight: 96.5 kg 98.2 kg 98.3 kg   Weight change: 1.2 kg Body mass index is 28.59 kg/m.   Physical Exam: General exam: Pleasant, middle-aged African-American  male. Skin: No rashes, lesions or ulcers. HEENT: Atraumatic, normocephalic, no obvious bleeding Lungs: Clear to auscultation bilaterally,  CVS: S1, S2, no murmur,   GI/Abd: Soft, nontender, nondistended, bowel sound present,   CNS: Alert, awake, oriented to place and person Psychiatry: Mood appropriate,  Extremities: No pedal edema, no calf tenderness,   Data Review: I have personally reviewed the laboratory data and studies available.  F/u labs  Unresulted Labs (From admission, onward)     Start     Ordered   11/19/23 1134  Oligoclonal bands, CSF + serum  (Oligoclonal Bands, CSF + Serum panel)  Once,   R        11/19/23 1134   11/19/23 1134  Draw extra clot tube  (Oligoclonal Bands, CSF + Serum panel)  Once,   R       Question:  Specimen collection method  Answer:  Lab=Lab collect   11/19/23 1134   11/19/23 1134  Draw extra clot tube  (IgG CSF Index, CSF + Serum panel)  Once,   R       Question:  Specimen collection method  Answer:  Lab=Lab collect   11/19/23 1134   11/19/23 1134  Miscellaneous [facility] test (send-out)  Once,   R       Comments: Autoimmune Encephalitis Ab panel - CSF   Question Answer Comment  Test name / description: Autoimmune Encephalitis Ab panel - CSF   Release to patient Immediate      11/19/23 1134   11/13/23 0618  CBC  Daily,   R     Question:  Specimen collection method  Answer:  Lab=Lab collect   11/13/23 0617   Unscheduled  Occult blood card to lab, stool  As needed,   R      11/19/23 1729            Total time spent in review of labs and imaging, patient evaluation, formulation of plan, documentation and communication with family: 55 minutes  Signed, Chapman Rota, MD [facility] 11/25/2023

## 2023-11-25 NOTE — Progress Notes (Addendum)
 Progress Note   LOS: 17 days   Chief Complaint:GI bleed   Subjective   Patient is oriented and answers questions appropriately. Oriented to person, time and place. Still appears somewhat altered.  Patient denies further GI bleeding.  States he is feeling good.  RN notes small amount of blood noted in stool earlier today.    Objective   Vital signs in last 24 hours: Temp:  [97.6 F (36.4 C)-98.3 F (36.8 C)] 97.7 F (36.5 C) (01/08 0820) Pulse Rate:  [75-83] 80 (01/08 0820) Resp:  [12-21] 12 (01/08 0820) BP: (98-125)/(45-72) 103/45 (01/08 0820) SpO2:  [90 %-97 %] 90 % (01/08 0820) Weight:  [98.2 kg] 98.2 kg (01/08 0643) Last BM Date : 11/25/23 Last BM recorded by nurses in past 5 days Stool Type: Type 6 (Mushy consistency with ragged edges) (11/25/2023  6:00 AM)  General:   male in no acute distress Heart:  Regular rate and rhythm; no murmurs Pulm: Clear anteriorly; no wheezing Abdomen: soft, nondistended, normal bowel sounds in all quadrants. Nontender without guarding. No organomegaly appreciated. Extremities:  No edema Neurologic:  Alert and  oriented x4;  No focal deficits.  Psych:  Cooperative. Normal mood and affect.  Intake/Output from previous day: 01/07 0701 - 01/08 0700 In: 440 [P.O.:440] Out: 0  Intake/Output this shift: Total I/O In: 290 [P.O.:290] Out: 0   Studies/Results: No results found.  Lab Results: Recent Labs    11/23/23 0840  WBC 9.0  HGB 14.2  HCT 43.7  PLT 400   BMET Recent Labs    11/23/23 0840 11/25/23 0257  NA 133* 131*  K 4.6 4.9  CL 89* 87*  CO2 21* 27  GLUCOSE 276* 265*  BUN 78* 63*  CREATININE 13.17* 11.32*  CALCIUM  10.1 9.9   LFT Recent Labs    11/23/23 0840  ALBUMIN  3.1*   PT/INR No results for input(s): LABPROT, INR in the last 72 hours.   Scheduled Meds:  aspirin   81 mg Oral Daily   atorvastatin   40 mg Oral Daily   Chlorhexidine  Gluconate Cloth  6 each Topical Q0600   cinacalcet   30 mg Oral Q  breakfast   doxercalciferol   3 mcg Intravenous Q M,W,F-HD   feeding supplement  237 mL Oral BID BM   Gerhardt's butt cream   Topical BID   [START ON 11/26/2023] heparin   2,000 Units Dialysis Once in dialysis   insulin  aspart  0-5 Units Subcutaneous QHS   insulin  aspart  0-9 Units Subcutaneous TID WC   insulin  glargine-yfgn  10 Units Subcutaneous Once   [START ON 11/26/2023] insulin  glargine-yfgn  20 Units Subcutaneous Daily   metoprolol  tartrate  12.5 mg Oral BID   midodrine   10 mg Oral TID WC   multivitamin  1 tablet Oral QHS   sevelamer  carbonate  4,000 mg Oral TID WC   thiamine  (VITAMIN B1) injection  100 mg Intravenous Daily   Or   thiamine   100 mg Oral Daily   Continuous Infusions:  anticoagulant sodium citrate         Patient profile:   63 y.o. male with a past medical history significant for ESRD on HD (Monday Wednesday Friday), heart failure, type 2 diabetes, CVA, and status post right BKA as well as obesity who initially presented to the hospital on 11/08/2023 for syncope during hemodialysis.  We are consulted now in regards to a GI Bleed.    Impression:   1.  GI bleed: Hemoglobin 16.2--> 14.2 overnight, no  new CBC since 1/6, no prior colonoscopy, maroon stool on exam yesterday, Hemoccult positive on 11/20/2023, small amount of bleeding today. GI pathogen panel pending.  Iron  46, saturation 16%, ferritin 421 2.  Acute metabolic encephalopathy: initial concern for encephalitis versus meningitis, workup so far unrevealing, neurology recommending continued supportive care with anticipation of patient will have a prolonged recovery encephalopathy related to hypoglycemia. mental status improving 3.  NSTEMI: Cardiology initially consulted, troponin elevation with a peak of 8942, echo with LVEF 30-35% and regional wall motion abnormalities, cardiology now reconsulted since mental status has improved. Cardiology notes he is high risk for EGD/Colonoscopy, but feels reasonable plan prior to  undergoing heart cath. 4.  ESRD on HD 5.  Acute on chronic diastolic heart failure 6.  Diarrhea: C. difficile negative, GI path panel being sent today 7.  Diabetes type 2 8.  History of CVA   Plan:   - Recheck CBC today, no obvious iron  deficiency on iron  studies - EGD/colonoscopy tomorrow.  Patient is agreeable. - I thoroughly discussed the procedure with the patient (at bedside) to include nature of the procedure, alternatives, benefits, and risks (including but not limited to bleeding, infection, perforation, anesthesia/cardiac pulmonary complications).  Patient verbalized understanding and gave verbal consent to proceed with procedure. - Movi prep, clear liquid diet, n.p.o. midnight  ADDENDUM: 1304 Stool study came back for norovirus. Continues to be somewhat altered. Will hold off on EGD/colonoscopy for now.   Bayley CHRISTELLA Blower  11/25/2023, 11:24 AM   Attending physician's note   I have taken a history, reviewed the chart and examined the patient. I performed a substantive portion of this encounter, including complete performance of at least one of the key components, in conjunction with the APP. I agree with the APP's note, impression and recommendations.    He continues to have altered mental status, influenza positive, was empirically treated for meningitis, LP was negative for infection, EEG negative for seizures GI pathogen positive for norovirus likely etiology for diarrhea, bloody diarrhea could be secondary to transient ischemic colitis, has since resolved Will defer EGD and colonoscopy until patient's mental status returns to baseline for workup of heme positive stool.  He did not have significant decline in hemoglobin, stable at 14. He is high risk for periprocedural complications due to his comorbid conditions including recent NSTEMI and ischemic cardiomyopathy. He is not having active GI hemorrhage, okay to initiate antiplatelet therapy or anticoagulation as needed per  cardiology     K. Veena Alexxia Stankiewicz , MD [phone]

## 2023-11-26 ENCOUNTER — Encounter (HOSPITAL_COMMUNITY)
Admission: EM | Disposition: A | Discharge status: Skilled Nursing Facility | Payer: Self-pay | Source: Home / Self Care | Attending: Internal Medicine

## 2023-11-26 DIAGNOSIS — G9341 Metabolic encephalopathy: Secondary | ICD-10-CM | POA: Diagnosis not present

## 2023-11-26 DIAGNOSIS — N186 End stage renal disease: Secondary | ICD-10-CM | POA: Diagnosis not present

## 2023-11-26 DIAGNOSIS — I214 Non-ST elevation (NSTEMI) myocardial infarction: Secondary | ICD-10-CM | POA: Diagnosis not present

## 2023-11-26 DIAGNOSIS — K922 Gastrointestinal hemorrhage, unspecified: Secondary | ICD-10-CM | POA: Diagnosis not present

## 2023-11-26 LAB — GLUCOSE, CAPILLARY
Glucose-Capillary: 172 mg/dL — ABNORMAL HIGH (ref 70–99)
Glucose-Capillary: 238 mg/dL — ABNORMAL HIGH (ref 70–99)
Glucose-Capillary: 220 mg/dL — ABNORMAL HIGH (ref 70–99)
Glucose-Capillary: 200 mg/dL — ABNORMAL HIGH (ref 70–99)

## 2023-11-26 SURGERY — ESOPHAGOGASTRODUODENOSCOPY (EGD)
Anesthesia: Monitor Anesthesia Care

## 2023-11-26 MED ORDER — HEPARIN SODIUM (PORCINE) 5000 UNIT/ML IJ SOLN
5000.0000 [IU] | Freq: Three times a day (TID) | INTRAMUSCULAR | Status: DC
Start: 2023-11-26 — End: 2023-11-27
  Administered 2023-11-26 (×2): 5000 [IU] via SUBCUTANEOUS
  Filled 2023-11-26 (×2): qty 1

## 2023-11-26 MED ORDER — CHLORHEXIDINE GLUCONATE CLOTH 2 % EX PADS
6.0000 | MEDICATED_PAD | Freq: Every day | CUTANEOUS | Status: DC
  Administered 2023-11-27 – 2023-11-29 (×3): 6 via TOPICAL

## 2023-11-26 MED ORDER — CLOPIDOGREL BISULFATE 75 MG PO TABS
75.0000 mg | ORAL_TABLET | Freq: Every day | ORAL | Status: DC
Start: 2023-11-26 — End: 2023-11-27
  Administered 2023-11-26: 75 mg via ORAL
  Filled 2023-11-26: qty 1

## 2023-11-26 NOTE — Plan of Care (Signed)
  Problem: Education: Goal: Knowledge of General Education information will improve Description: Including pain rating scale, medication(s)/side effects and non-pharmacologic comfort measures Outcome: Progressing   Problem: Health Behavior/Discharge Planning: Goal: Ability to manage health-related needs will improve Outcome: Progressing   Problem: Clinical Measurements: Goal: Ability to maintain clinical measurements within normal limits will improve Outcome: Progressing Goal: Will remain free from infection Outcome: Progressing Goal: Diagnostic test results will improve Outcome: Progressing Goal: Respiratory complications will improve Outcome: Progressing Goal: Cardiovascular complication will be avoided Outcome: Progressing   Problem: Activity: Goal: Risk for activity intolerance will decrease Outcome: Progressing   Problem: Nutrition: Goal: Adequate nutrition will be maintained Outcome: Progressing   Problem: Coping: Goal: Level of anxiety will decrease Outcome: Progressing   Problem: Elimination: Goal: Will not experience complications related to bowel motility Outcome: Progressing Goal: Will not experience complications related to urinary retention Outcome: Progressing   Problem: Pain Management: Goal: General experience of comfort will improve Outcome: Progressing   Problem: Safety: Goal: Ability to remain free from injury will improve Outcome: Progressing   Problem: Skin Integrity: Goal: Risk for impaired skin integrity will decrease Outcome: Progressing   Problem: Education: Goal: Ability to describe self-care measures that may prevent or decrease complications (Diabetes Survival Skills Education) will improve Outcome: Progressing Goal: Individualized Educational Video(s) Outcome: Progressing   Problem: Coping: Goal: Ability to adjust to condition or change in health will improve Outcome: Progressing   Problem: Fluid Volume: Goal: Ability to  maintain a balanced intake and output will improve Outcome: Progressing   Problem: Health Behavior/Discharge Planning: Goal: Ability to identify and utilize available resources and services will improve Outcome: Progressing Goal: Ability to manage health-related needs will improve Outcome: Progressing   Problem: Metabolic: Goal: Ability to maintain appropriate glucose levels will improve Outcome: Progressing   Problem: Nutritional: Goal: Maintenance of adequate nutrition will improve Outcome: Progressing Goal: Progress toward achieving an optimal weight will improve Outcome: Progressing   Problem: Skin Integrity: Goal: Risk for impaired skin integrity will decrease Outcome: Progressing   Problem: Tissue Perfusion: Goal: Adequacy of tissue perfusion will improve Outcome: Progressing   Problem: Safety: Goal: Non-violent Restraint(s) Outcome: Progressing

## 2023-11-26 NOTE — Progress Notes (Addendum)
 Progress Note   LOS: 18 days   Chief Complaint:GI bleed   Subjective   Patient reports no further bleeding today.  Denies abdominal pain, nausea, vomiting.  Doing well and tolerating diet without difficulty.  Somewhat somnolent on my evaluation.  Appears to be less altered.  Remains oriented.   Objective   Vital signs in last 24 hours: Temp:  [98.1 F (36.7 C)-98.5 F (36.9 C)] 98.4 F (36.9 C) (01/09 1109) Pulse Rate:  [74-79] 78 (01/09 1109) Resp:  [11-21] 20 (01/09 1109) BP: (85-122)/(49-73) 88/52 (01/09 1109) SpO2:  [93 %-96 %] 95 % (01/09 1109) Weight:  [103.4 kg] 103.4 kg (01/09 0455) Last BM Date : 11/26/23 Last BM recorded by nurses in past 5 days Stool Type: Type 6 (Mushy consistency with ragged edges) (11/25/2023 11:00 AM)  General:   male in no acute distress  Heart:  Regular rate and rhythm; no murmurs Pulm: Clear anteriorly; no wheezing Abdomen: soft, nondistended, normal bowel sounds in all quadrants. Nontender without guarding. No organomegaly appreciated. Extremities:  No edema Neurologic:  Alert and  oriented x4;  No focal deficits.  Psych:  Cooperative. Normal mood and affect.  Intake/Output from previous day: 01/08 0701 - 01/09 0700 In: 770 [P.O.:770] Out: -800  Intake/Output this shift: No intake/output data recorded.  Studies/Results: No results found.  Lab Results: Recent Labs    11/25/23 1108  WBC 8.9  HGB 14.0  HCT 42.7  PLT 387   BMET Recent Labs    11/25/23 0257  NA 131*  K 4.9  CL 87*  CO2 27  GLUCOSE 265*  BUN 63*  CREATININE 11.32*  CALCIUM  9.9   LFT No results for input(s): PROT, ALBUMIN , AST, ALT, ALKPHOS, BILITOT, BILIDIR, IBILI in the last 72 hours. PT/INR No results for input(s): LABPROT, INR in the last 72 hours.   Scheduled Meds:  aspirin   81 mg Oral Daily   atorvastatin   40 mg Oral Daily   Chlorhexidine  Gluconate Cloth  6 each Topical Q0600   [START ON 11/27/2023] Chlorhexidine   Gluconate Cloth  6 each Topical Q0600   cinacalcet   30 mg Oral Q breakfast   clopidogrel   75 mg Oral Daily   doxercalciferol   3 mcg Intravenous Q M,W,F-HD   feeding supplement  237 mL Oral BID BM   heparin  injection (subcutaneous)  5,000 Units Subcutaneous Q8H   insulin  aspart  0-5 Units Subcutaneous QHS   insulin  aspart  0-9 Units Subcutaneous TID WC   insulin  glargine-yfgn  20 Units Subcutaneous Daily   metoprolol  tartrate  12.5 mg Oral BID   midodrine   10 mg Oral TID WC   multivitamin  1 tablet Oral QHS   sevelamer  carbonate  4,000 mg Oral TID WC   thiamine  (VITAMIN B1) injection  100 mg Intravenous Daily   Or   thiamine   100 mg Oral Daily   Continuous Infusions:  sodium chloride         Patient profile:   63 y.o. male with a past medical history significant for ESRD on HD (Monday Wednesday Friday), heart failure, type 2 diabetes, CVA, and status post right BKA as well as obesity who initially presented to the hospital on 11/08/2023 for syncope during hemodialysis. We are consulted now in regards to a GI Bleed.    Impression:   1.  GI bleed: Hemoglobin 16.2--> 14.0. no prior colonoscopy, Hemoccult positive on 11/20/2023, GI pathogen panel positive for norovirus. No bleeding today Iron  46, saturation 16%, ferritin 421 2.  Acute metabolic encephalopathy: initial concern for encephalitis versus meningitis, workup so far unrevealing, neurology recommending continued supportive care with anticipation of patient will have a prolonged recovery encephalopathy related to hypoglycemia. mental status improving 3.  NSTEMI: Cardiology initially consulted, troponin elevation with a peak of 8942, echo with LVEF 30-35% and regional wall motion abnormalities, cardiology now reconsulted since mental status has improved. Cardiology notes he is high risk for EGD/Colonoscopy, but feels reasonable plan prior to undergoing heart cath. 4.  ESRD on HD 5.  Acute on chronic diastolic heart failure 6.   Diarrhea: C. difficile negative, GI path panel being sent today 7.  Diabetes type 2 8.  History of CVA   Plan:   - Can recheck CBC.  Yesterday hemoglobin remained stable - No further bleeding which is reassuring - Patient is high risk for procedures.  Without significant overt bleeding or significant drop in hemoglobin risk of EGD/colonoscopy may outweigh benefit in this scenario.  Defer to Dr. Shila - Continue to monitor for bleeding.  Bayley CHRISTELLA Blower  11/26/2023, 5:04 PM   Attending physician's note   I have taken a history, reviewed the chart and examined the patient. I performed a substantive portion of this encounter, including complete performance of at least one of the key components, in conjunction with the APP. I agree with the APP's note, impression and recommendations.   Patient became hypotensive post hemodialysis overnight.  He had multiple episodes of bloody diarrhea overnight.  Hemoglobin trended down to 12 from 14 He was ordered 1 unit PRBC transfusion though unclear the need for PRBC transfusion given hemoglobin is 12  Likely etiology of bloody diarrhea is ischemic colitis in the setting of severe hypotension He also has acute gastroenteritis secondary to norovirus  No plan for endoscopic evaluation at this point. Continue diet as tolerated Supportive care   K. Veena Emilie Carp , MD [phone]

## 2023-11-26 NOTE — Progress Notes (Signed)
 PROGRESS NOTE  [name] COMINSKY Sr.  DOB: 21-Oct-1961  PCP: Freddrick Johns FMW:[id number]  DOA: 11/08/2023  LOS: 18 days  Hospital Day: 19  Brief narrative: [name] [name] is a 63 y.o. male with PMH significant for ESRD-HD-MWF, DM2, HTN, nonischemic cardiomyopathy, mild systolic CHF, prior right BKA 87/77, patient was brought to the ED after experiencing an episode of chest pain, hypotension and near syncope during hemodialysis.  His initial blood pressure was low in 75/59 On further workup in the ED, troponin was significantly elevated and peaked at 9000 He was diagnosed with NSTEMI, started on heparin  drip. Admitted to [facility]  Seen by cardiology  See below for details  Hospital course was also complicated altered mental status.  Found to have influenza A positive.  Mental status gradually improved Currently followed by GI and cardiology Pending cardiac cath  Subjective: Patient was seen and examined this morning.  Pleasant middle-aged African-American male.  Lying on bed.  Not in distress.  Family not at bedside Chart reviewed. GI and cardiology note from yesterday reviewed.  Per GI, no need of EGD/colonoscopy for now before cardiac cath.  Assessment and plan: NSTEMI Presented with chest pain, hypertension, syncope, significantly elevated troponin  12/23 echocardiogram showed EF low at 30 to 35% with regional WMA, moderate to severe aortic stenosis Initially started on medical management with IV heparin  drip, aspirin  and Lipitor Cardiology wanted to hold off on further invasive study until mental status improved. Cardiology was reconsulted on 1/7. Currently on aspirin , Lipitor. Tentative plan of cardiac intervention, unclear timing  Acute exacerbation of combined systolic and diastolic CHF Moderate to severe aortic stenosis Echo from 10/1811/23 compared.  Noted a drop in EF from 45-50% to 30-35%, along with grade 3 diastolic dysfunction as well as moderate to severe aortic valve  stenosis. Cardiology following Currently on metoprolol  12.5 mg twice daily and midodrine  10 mg 3 times daily.  Blood pressure mostly low normal, in 80s this morning. Also on dialysis for fluid management  ESRD-HD-MWF Nephrology on board  Influenza A positive Detected on 12/30.  Completed the course of Tamiflu   Diarrhea C. difficile negative. Possibly antibiotic related Antibiotics stopped.  Diarrhea improved  Occult positive stool Unclear etiology. Hemoglobin is currently stable but patient complains of recurrent bloody stools. [facility] consulted in anticipation of DAPT once patient gets cardiac cath. Per last GI note from 1/8, stable hemoglobin and hence no need of EGD/colonoscopy now Recent Labs    12/17/22 1244 12/17/22 1245 10/22/23 2343 11/16/23 1518 11/17/23 0245 11/18/23 1515 11/20/23 0815 11/22/23 1044 11/23/23 0840 11/24/23 1306 11/25/23 1108  HGB  --   --    < >  --    < > 15.8 15.8 16.2 14.2  --  14.0  MCV  --   --    < >  --    < > 86.7 89.0 88.9 89.0  --  87.3  VITAMINB12  --   --   --  2,058*  --   --   --   --   --   --   --   FOLATE  --   --   --  16.6  --   --   --   --   --   --   --   FERRITIN 1,481*  --   --   --   --   --   --   --   --  421*  --   TIBC  --  190*  --   --   --   --   --   --   --  294  --   IRON   --  72  --   --   --   --   --   --   --  46  --    < > = values in this interval not displayed.   Acute metabolic encephalopathy Patient hospital stay was complicated by altered mental status.  There was initial concern for encephalitis vs meningitis. Neurology was consulted.  Patient while started empirically on broad-spectrum antibiotics, antiviral. 12/27 and 1/1, EEG did not show any evidence of seizure activity. 1/3, MRI brain did not any acute abnormality. 1/3, lumbar puncture done. CSF labs significant for elevated glucose and protein, not consistent with meningitis; Meningitis/encephalitis panel negative for pathogen. Antibiotics  were stopped. Neurology recommendation for continued supportive care with anticipation that patient will have a prolonged recovery of encephalopathy related to hyperglycemia.  ID and neurology signed off. Mental status gradually improved   Type 2 diabetes mellitus uncontrolled A1c more than 15.51 10/24/2023 Currently on Semglee  20 units daily SSI/Accu-Cheks.  Blood sugar level gradually improving.  Continue to monitor. Recent Labs  Lab 11/25/23 0546 11/25/23 0821 11/25/23 1600 11/25/23 2228 11/26/23 0620  GLUCAP 292* 303* 163* 205* 172*   H/o CVA Continue ASA, and Lipitor   Mobility: PT eval obtained.  SNF recommended  Goals of care   Code Status: Full Code     DVT prophylaxis:  heparin  injection 5,000 Units Start: 11/26/23 1400 Place and maintain sequential compression device Start: 11/20/23 0706 SCDs Start: 11/09/23 1558   Antimicrobials: Completed a course Fluid: None Consultants: GI, cardiology, nephrology Family Communication: Family not at bedside  Status: Inpatient Level of care:  Progressive   Patient is from: Home Needs to continue in-hospital care: Pending procedures Anticipated d/c to: SNF recommended by PT  Diet:  Diet Order             DIET SOFT Fluid consistency: Thin; Fluid restriction: 2000 mL Fluid  Diet effective now                   Scheduled Meds:  aspirin   81 mg Oral Daily   atorvastatin   40 mg Oral Daily   Chlorhexidine  Gluconate Cloth  6 each Topical Q0600   cinacalcet   30 mg Oral Q breakfast   doxercalciferol   3 mcg Intravenous Q M,W,F-HD   feeding supplement  237 mL Oral BID BM   heparin  injection (subcutaneous)  5,000 Units Subcutaneous Q8H   insulin  aspart  0-5 Units Subcutaneous QHS   insulin  aspart  0-9 Units Subcutaneous TID WC   insulin  glargine-yfgn  20 Units Subcutaneous Daily   metoprolol  tartrate  12.5 mg Oral BID   midodrine   10 mg Oral TID WC   multivitamin  1 tablet Oral QHS   sevelamer  carbonate  4,000 mg  Oral TID WC   thiamine  (VITAMIN B1) injection  100 mg Intravenous Daily   Or   thiamine   100 mg Oral Daily    PRN meds: acetaminophen  **OR** acetaminophen , ondansetron  **OR** ondansetron  (ZOFRAN ) IV, mouth rinse, mouth rinse, mouth rinse, sevelamer  carbonate   Infusions:   sodium chloride       Antimicrobials: Anti-infectives (From admission, onward)    Start     Dose/Rate Route Frequency Ordered Stop   11/18/23 1800  oseltamivir  (TAMIFLU ) capsule 30 mg        30 mg Oral  Every M-W-F (Hemodialysis) 11/18/23 1116 11/23/23 1159   11/17/23 1800  oseltamivir  (TAMIFLU ) capsule 30 mg  Status:  Discontinued        30 mg Oral Every T-Th-Sa (1800) 11/17/23 0800 11/18/23 1116   11/17/23 1115  vancomycin  (VANCOCIN ) IVPB 1000 mg/200 mL premix  Status:  Discontinued        1,000 mg 200 mL/hr over 60 Minutes Intravenous Once in dialysis 11/17/23 1021 11/18/23 1331   11/17/23 1000  oseltamivir  (TAMIFLU ) capsule 30 mg        30 mg Oral  Once 11/17/23 0759 11/17/23 0952   11/16/23 1200  vancomycin  (VANCOCIN ) IVPB 1000 mg/200 mL premix  Status:  Discontinued        1,000 mg 200 mL/hr over 60 Minutes Intravenous Every M-W-F (Hemodialysis) 11/15/23 1658 11/18/23 1306   11/12/23 2300  vancomycin  (VANCOCIN ) IVPB 1000 mg/200 mL premix        1,000 mg 200 mL/hr over 60 Minutes Intravenous  Once 11/12/23 2226 11/13/23 0131   11/12/23 2000  cefTRIAXone  (ROCEPHIN ) 2 g in sodium chloride  0.9 % 100 mL IVPB  Status:  Discontinued        2 g 200 mL/hr over 30 Minutes Intravenous Every 12 hours 11/12/23 1627 11/18/23 1306   11/12/23 2000  ampicillin  (OMNIPEN) 2 g in sodium chloride  0.9 % 100 mL IVPB  Status:  Discontinued        2 g 300 mL/hr over 20 Minutes Intravenous Every 12 hours 11/12/23 1627 11/18/23 1306   11/12/23 1730  acyclovir  (ZOVIRAX ) 450 mg in dextrose  5 % 100 mL IVPB  Status:  Discontinued        5 mg/kg  89.6 kg (Adjusted) 109 mL/hr over 60 Minutes Intravenous Every 24 hours 11/12/23 1627  11/18/23 1306   11/11/23 1915  piperacillin -tazobactam (ZOSYN ) IVPB 2.25 g  Status:  Discontinued        2.25 g 100 mL/hr over 30 Minutes Intravenous Every 8 hours 11/11/23 1825 11/12/23 1615   11/11/23 1245  metroNIDAZOLE  (FLAGYL ) IVPB 500 mg  Status:  Discontinued        500 mg 100 mL/hr over 60 Minutes Intravenous Every 12 hours 11/11/23 1146 11/11/23 1825   11/11/23 1200  vancomycin  (VANCOCIN ) IVPB 1000 mg/200 mL premix  Status:  Discontinued        1,000 mg 200 mL/hr over 60 Minutes Intravenous Every M-W-F (Hemodialysis) 11/10/23 1458 11/15/23 1658   11/10/23 1545  vancomycin  (VANCOCIN ) IVPB 1000 mg/200 mL premix       Placed in Followed by Linked Group   1,000 mg 200 mL/hr over 60 Minutes Intravenous  Once 11/10/23 1458 11/10/23 1917   11/10/23 1545  vancomycin  (VANCOCIN ) IVPB 1000 mg/200 mL premix       Placed in Followed by Linked Group   1,000 mg 200 mL/hr over 60 Minutes Intravenous  Once 11/10/23 1458 11/10/23 1815   11/10/23 1545  ceFEPIme  (MAXIPIME ) 1 g in sodium chloride  0.9 % 100 mL IVPB  Status:  Discontinued        1 g 200 mL/hr over 30 Minutes Intravenous Every M-W-F 11/10/23 1458 11/10/23 1520   11/10/23 1545  ceFEPIme  (MAXIPIME ) 2 g in sodium chloride  0.9 % 100 mL IVPB  Status:  Discontinued        2 g 200 mL/hr over 30 Minutes Intravenous Every M-W-F 11/10/23 1520 11/11/23 1810       Objective: Vitals:   11/26/23 1021 11/26/23 1109  BP: (!) 85/49 (!) 88/52  Pulse: 79 78  Resp:  20  Temp:  98.4 F (36.9 C)  SpO2:  95%    Intake/Output Summary (Last 24 hours) at 11/26/2023 1124 Last data filed at 11/26/2023 0700 Gross per 24 hour  Intake 240 ml  Output -800 ml  Net 1040 ml   Filed Weights   11/25/23 1240 11/25/23 1514 11/26/23 0455  Weight: 98.3 kg 99 kg 103.4 kg   Weight change: 1.8 kg Body mass index is 30.08 kg/m.   Physical Exam: General exam: Pleasant, middle-aged African-American male.  Not in distress Skin: No rashes, lesions or  ulcers. HEENT: Atraumatic, normocephalic, no obvious bleeding Lungs: Clear to auscultation bilaterally,  CVS: S1, S2, no murmur,   GI/Abd: Soft, nontender, nondistended, bowel sound present,   CNS: Alert, awake, oriented x 3 today.  Able to tell me the date as well today. Psychiatry: Mood appropriate,  Extremities: No pedal edema, no calf tenderness,   Data Review: I have personally reviewed the laboratory data and studies available.  F/u labs  Unresulted Labs (From admission, onward)     Start     Ordered   11/19/23 1134  Oligoclonal bands, CSF + serum  (Oligoclonal Bands, CSF + Serum panel)  Once,   R        11/19/23 1134   11/19/23 1134  Draw extra clot tube  (Oligoclonal Bands, CSF + Serum panel)  Once,   R       Question:  Specimen collection method  Answer:  Lab=Lab collect   11/19/23 1134   11/19/23 1134  Draw extra clot tube  (IgG CSF Index, CSF + Serum panel)  Once,   R       Question:  Specimen collection method  Answer:  Lab=Lab collect   11/19/23 1134   11/19/23 1134  Miscellaneous [facility] test (send-out)  Once,   R       Comments: Autoimmune Encephalitis Ab panel - CSF   Question Answer Comment  Test name / description: Autoimmune Encephalitis Ab panel - CSF   Release to patient Immediate      11/19/23 1134   11/13/23 0618  CBC  Daily,   R     Question:  Specimen collection method  Answer:  Lab=Lab collect   11/13/23 0617   Unscheduled  Occult blood card to lab, stool  As needed,   R      11/19/23 1729            Total time spent in review of labs and imaging, patient evaluation, formulation of plan, documentation and communication with family: 45 minutes  Signed, Chapman Rota, MD [facility] 11/26/2023

## 2023-11-26 NOTE — Progress Notes (Addendum)
 Speech Language Pathology Treatment: Dysphagia;Cognitive-Linquistic  Patient Details Name: [name] TELLEFSEN [name]. MRN: [id number] DOB: 1961/05/12 Today's Date: 11/26/2023 Time: 8667-8651 SLP Time Calculation (min) (ACUTE ONLY): 16 min  Assessment / Plan / Recommendation Clinical Impression  Pt appears significantly improved since previous visits. He is now alert and oriented. Dysarthria is resolved but pt continues to present with a flat affect. Observed pt with regular texture solids and thin liquids without overt s/s of dysphagia or aspiration. Recommend upgrading diet to regular solids and continuing thin liquids as able per GI. Pt able to participate in memory recall and problem solving tasks with good sustained attention. He accurately recalled 3/5 novel words after a ~3 minute delay. Pt participated well in naming and repetition activities without noted linguistic deficits. Suspect he has returned to baseline but may still benefit from increased supervision and ongoing SLP f/u for cognition at pt's next venue of care. Current presentation is suspected to be representative of chronic cognitive changes. Will s/o at this time.    HPI HPI: [name] [name] is a 63 yo male presenting to ED 12/22 after experiencing near syncope, chest pain, and hypotension during HD. Admitted with NSTEMI. Recently admitted 12/6-12/10 for a fall and uncontrolled hyperglycemia. PMH includes ESRD on HD, CHF, T2DM s/p R BKA, obesity      SLP Plan  All goals met      Recommendations for follow up therapy are one component of a multi-disciplinary discharge planning process, led by the attending physician.  Recommendations may be updated based on patient status, additional functional criteria and insurance authorization.    Recommendations  Diet recommendations: Regular;Thin liquid Liquids provided via: Cup;Straw Medication Administration: Whole meds with liquid Supervision: Staff to assist with self feeding;Full  supervision/cueing for compensatory strategies Compensations: Minimize environmental distractions;Slow rate;Small sips/bites Postural Changes and/or Swallow Maneuvers: Out of bed for meals;Seated upright 90 degrees                  Oral care BID   Frequent or constant Supervision/Assistance Dysarthria and anarthria (R47.1);Cognitive communication deficit (R41.841);Dysphagia, unspecified (R13.10)     All goals met     Damien Blumenthal, M.A., CF-SLP Speech Language Pathology, Acute Rehabilitation Services  Secure Chat preferred [phone]   11/26/2023, 2:12 PM

## 2023-11-26 NOTE — Progress Notes (Addendum)
 Progress Note  Patient Name: [name] MEXICANO Sr. Date of Encounter: 11/26/2023  Primary Cardiologist: Jayson Sierras, MD  Subjective   Denies any CP or SOB. States today is his daddy's birthday. He is no longer with us . Affect somewhat reserved.  Inpatient Medications    Scheduled Meds:  aspirin   81 mg Oral Daily   atorvastatin   40 mg Oral Daily   Chlorhexidine  Gluconate Cloth  6 each Topical Q0600   cinacalcet   30 mg Oral Q breakfast   doxercalciferol   3 mcg Intravenous Q M,W,F-HD   feeding supplement  237 mL Oral BID BM   heparin  injection (subcutaneous)  5,000 Units Subcutaneous Q8H   insulin  aspart  0-5 Units Subcutaneous QHS   insulin  aspart  0-9 Units Subcutaneous TID WC   insulin  glargine-yfgn  20 Units Subcutaneous Daily   metoprolol  tartrate  12.5 mg Oral BID   midodrine   10 mg Oral TID WC   multivitamin  1 tablet Oral QHS   sevelamer  carbonate  4,000 mg Oral TID WC   thiamine  (VITAMIN B1) injection  100 mg Intravenous Daily   Or   thiamine   100 mg Oral Daily   Continuous Infusions:  sodium chloride      PRN Meds: acetaminophen  **OR** acetaminophen , ondansetron  **OR** ondansetron  (ZOFRAN ) IV, mouth rinse, mouth rinse, mouth rinse, sevelamer  carbonate   Vital Signs    Vitals:   11/26/23 0815 11/26/23 0907 11/26/23 1021 11/26/23 1109  BP: 100/63 (!) 85/49 (!) 85/49 (!) 88/52  Pulse: 79  79 78  Resp: 20 16  20   Temp: 98.2 F (36.8 C)   98.4 F (36.9 C)  TempSrc: Oral   Oral  SpO2: 93%   95%  Weight:      Height:        Intake/Output Summary (Last 24 hours) at 11/26/2023 1136 Last data filed at 11/26/2023 0700 Gross per 24 hour  Intake 240 ml  Output -800 ml  Net 1040 ml      11/26/2023    4:55 AM 11/25/2023    3:14 PM 11/25/2023   12:40 PM  Last 3 Weights  Weight (lbs) 227 lb 15.3 oz 218 lb 4.1 oz 216 lb 11.4 oz  Weight (kg) 103.4 kg 99 kg 98.3 kg     Telemetry    NSR, rare PVC - Personally Reviewed  Physical Exam   GEN: No acute distress.   HEENT: Normocephalic, atraumatic, sclera non-icteric.  Neck: No JVD or bruits. Cardiac: Distant heart sounds RRR soft SEM no rubs or gallops.  Respiratory: Clear to auscultation bilaterally. Breathing is unlabored. GI: Soft, nontender, non-distended, BS +x 4. MS: no deformity. Extremities: No clubbing or cyanosis. No edema. R BKA Neuro:  AAOx3 (Took time with year and actual name of hospital). Follows commands. Psych:  Responds to questions appropriately with a blunted affect.  Labs    High Sensitivity Troponin:   Recent Labs  Lab 11/09/23 1111 11/09/23 1314 11/10/23 0907 11/10/23 1503 11/11/23 0001  TROPONINIHS 1,557* 8,849* 8,942* 7,282* 7,672*      Cardiac EnzymesNo results for input(s): TROPONINI in the last 168 hours. No results for input(s): TROPIPOC in the last 168 hours.   Chemistry Recent Labs  Lab 11/20/23 1214 11/22/23 1044 11/23/23 0840 11/25/23 0257  NA  --  135 133* 131*  K  --  4.4 4.6 4.9  CL  --  92* 89* 87*  CO2  --  21* 21* 27  GLUCOSE  --  200* 276* 265*  BUN  --  61* 78* 63*  CREATININE  --  11.72* 13.17* 11.32*  CALCIUM   --  10.4* 10.1 9.9  ALBUMIN  4.0 3.3* 3.1*  --   GFRNONAA  --  4* 4* 5*  ANIONGAP  --  22* 23* 17*     Hematology Recent Labs  Lab 11/22/23 1044 11/23/23 0840 11/25/23 1108  WBC 9.5 9.0 8.9  RBC 5.61 4.91 4.89  HGB 16.2 14.2 14.0  HCT 49.9 43.7 42.7  MCV 88.9 89.0 87.3  MCH 28.9 28.9 28.6  MCHC 32.5 32.5 32.8  RDW 16.7* 16.3* 15.8*  PLT 367 400 387    BNPNo results for input(s): BNP, PROBNP in the last 168 hours.   DDimer No results for input(s): DDIMER in the last 168 hours.   Radiology    No results found.  Cardiac Studies   2d echo 11/09/23  1. Left ventricular ejection fraction, by estimation, is 30 to 35%. The  left ventricle has moderately decreased function. The left ventricle  demonstrates regional wall motion abnormalities (see scoring  diagram/findings for description). There is   moderate concentric left ventricular hypertrophy. Left ventricular  diastolic parameters are consistent with Grade III diastolic dysfunction  (restrictive).   2. Right ventricular systolic function is mildly reduced. The right  ventricular size is normal. There is mildly elevated pulmonary artery  systolic pressure. The estimated right ventricular systolic pressure is  42.2 mmHg.   3. Left atrial size was moderately dilated.   4. The mitral valve is degenerative. Trivial mitral valve regurgitation.  Moderate mitral annular calcification.   5. The aortic valve is tricuspid. There is moderate calcification of the  aortic valve. Aortic valve regurgitation is not visualized. Moderate to  severe aortic valve stenosis - suspect upper end of scale, low flow/low  gradient. Aortic valve area, by VTI  measures 0.98 cm. Aortic valve mean gradient measures 22.0 mmHg.  Dimentionless index 0.19.   6. Aortic dilatation noted. There is borderline dilatation of the aortic  root, measuring 40 mm.   7. The inferior vena cava is dilated in size with <50% respiratory  variability, suggesting right atrial pressure of 15 mmHg.   Comparison(s): Prior images reviewed side by side. LVEF has decreased,  30-35% range with basal inferoposterior akinesis and restrictive diastolic  filling. Aortic stenosis possibly severe (low flow/low gradient). Mildly  elevated RVSP.   Patient Profile     63 y.o. male with ESRD on HD MW, DM2, HTN, NICM, chronic HFrEF, prior R BKA, obesity admitted 11/08/2023 with episode of chest pain, hypotension and near syncope during HD. Found to have NSTEMI. Cath deferred due to AMS/encephalopathy, influenza A. Hospital course notable for drop in EF to 30-35% with possible severe AS, diarrhea possibly abx related, and heme occult positive stool.  Assessment & Plan    1. Suspected NSTEMI, prior nonbstructive CAD, admission complicated by encephalopathy, flu A, norovirus, + FOBT  - Patient  previously had cardiac catheterization in [place] in 2019 due to LV dysfunction (EF 25%)- found to have 30% stenosis in prox LAD and distal RCA. Was managed medically at the time  - Later, underwent nuclear stress test in 05/2021 through [facility] ([facility])- There is a moderate sized, severe, perfusion defect involving the apical, apical inferior, mid inferior, and basal inferior segments. The defects of the involved segments are nearly completely reversible. This is consistent with probable ischemia. He was again managed medically  - Now, patient presented after having near syncope during HD. Hs trop peaked 51. He has completed  48 hours IV Heparin . Continue ASA 81 mg daily and lipitor 40 mg daily (LDL 39) - Echo with EF 30-35% and regional wall motion abnormalities  - Patient suspected to have progressive underlying CAD but unclear that he would be CABG Candidate. - With patient's anemia, unclear if he would be able to tolerate DAPT. Patient was also found to have norovirus. GI initially considered scope but recommended to defer EGD and colonoscopy until patient's mental status returns to baseline for workup of heme positive stool; they felt he would be at high risk for periprocedural complications due to his comorbid conditions including recent NSTEMI and ischemic cardiomyopathy. Per their note He is not having active GI hemorrhage, okay to initiate antiplatelet therapy or anticoagulation as needed per cardiology - Per d/w Dr. Santo, will trial addition of Plavix  75mg  daily as part of med rx to see if he tolerates DAPT   2. Chronic HFrEF complicated by moderate-severe aortic valve stenosis  - Echocardiogram 11/09/23 showed EF 30-35% with regional wall motion abnormalities, grade III DD, mildly reduced RV systolic function, moderate-severe aortic valve stenosis  - Will review cath plan with MD - GDMT limited by low BP, ESRD  - On midodrine  for BP support   3. NSVT  - Improved after addition of BB- now only  rare PVCs on tele  - Baseline TSH in AM - Lyte mgmt per nephro with HD   4. AMS - multiple physiologic stressors - followed by neuro, ID initially, now IM  Currently getting IVF 20cc/hr as holdover from GI orders. Will defer IVF orders to primary team. Remainder per primary  Please let cardiology know when patient is being discharged - per notes, plan are for SNF. If local will arrange local f/u. Otherwise anticipate f/u in [facility] as patient lived in [place].   For questions or updates, please contact [facility] Please consult [email or web address] for contact info under Cardiology/STEMI.  Signed, Raphael LOISE Bring, PA-C 11/26/2023, 11:36 AM   Personally seen and examined. Agree with APP above with the following comments: Asymptomatic happy that he is not getting colonoscopy.  Found to have norovirus. CTAB.  He is more engaged with me.  Rare irregular heart beat.  Rest as above. HE is at high risk of having underlying CAD.   He actively has norovirus.  GI deferred EGD/Colo due to high risk. He was cleared for DAPT.   Will start DAPT.  Outpatient cardiology follow up for potential cath.. No GDMT due to midodrine  need and ESRD. Will sign off.  Stanly Santo, MD FASE [facility] Cardi[facility]  [address] [place], [address] [place], [place] [address] [phone]  2:27 PM

## 2023-11-26 NOTE — Progress Notes (Signed)
 Patient ID: [name] [name], male   DOB: 1961-05-12, 63 y.o.   MRN: [id number] S: Seen in HD, no c/o's. BP's low in HD.   O:BP (!) 88/52 (BP Location: Right Arm)   Pulse 78   Temp 98.4 F (36.9 C) (Oral)   Resp 20   Ht 6' 1 (1.854 m)   Wt 103.4 kg   SpO2 95%   BMI 30.08 kg/m   Intake/Output Summary (Last 24 hours) at 11/26/2023 1405 Last data filed at 11/26/2023 0700 Gross per 24 hour  Intake 240 ml  Output -800 ml  Net 1040 ml   Intake/Output: I/O last 3 completed shifts: In: 770 [P.O.:770] Out: -800   Intake/Output this shift:  No intake/output data recorded. Weight change: 1.8 kg Gen: NAD CVS: RRR Resp:CTA Abd: +BS, soft, NT/ND Ext: s/p R BKA, no edema, LUE AVF +T/B  OP HD: [facility] MWF 4h  450/700  105kg   2/2 bath   AVF   Heparin  7000 + 2000 midrun - Hectorol  7 mcg IV three times per week   Assessment/Plan: 1.AMS: Was on cefepime  which has been stopped. Per primary team some concern for encephalitis/meningitis, was on empiric antibiotics/antivirals until seen by ID on 11/18/23 and all was stopped.  S/P LP which ruled out bacterail meningitis/encephalitis. He did test + for flu A by resp panel. MRI without acute abnormality.  ID stopped abx and antivirals and signed off. Getting trial of high-dose thiamine . Mental status has sig improved.  2.  ESRD - usual HD is MWF. HD tomorrow.  3. NSTEMI- work up per cardiology/primary.  4. Volume - is under dry wt by 8kg. Didn't tolerated any UF w/ HD today, BP's stayed in the 80s-90s until rinseback. No further dry wt lowering while here.  5. Fever/Hypotension- BC remain no growth. Was started on board spectrum antibiotics/antivirals but now discontinued.  ID signed off. 6. HFrEF/history of severe AS- attempting to optimize volume with dialysis. ECHO  EF 30 to 35%. Seen by Cards. Fluid restrictions and daily weights. On midodrine  10 mg 3 times daily.  7. Anemia  - Hgb above goal, no ESA indicated at this time.   8. Metabolic bone disease -Phos at goal. Continue renvela . VDRA on hold d/t high calcium ; coming down. 9. Debility - memory is better however he remains lethargic and debilitated. PT is working w/ him, they don't think he can tolerate HD in a chair just yet.    [name]  MD  [facility] 11/26/2023, 2:05 PM  Recent Labs  Lab 11/22/23 1044 11/23/23 0840 11/25/23 0257 11/25/23 1108  HGB 16.2 14.2  --  14.0  ALBUMIN  3.3* 3.1*  --   --   CALCIUM  10.4* 10.1 9.9  --   PHOS 7.2* 8.0*  --   --   CREATININE 11.72* 13.17* 11.32*  --   K 4.4 4.6 4.9  --     Inpatient medications:  aspirin   81 mg Oral Daily   atorvastatin   40 mg Oral Daily   Chlorhexidine  Gluconate Cloth  6 each Topical Q0600   cinacalcet   30 mg Oral Q breakfast   clopidogrel   75 mg Oral Daily   doxercalciferol   3 mcg Intravenous Q M,W,F-HD   feeding supplement  237 mL Oral BID BM   heparin  injection (subcutaneous)  5,000 Units Subcutaneous Q8H   insulin  aspart  0-5 Units Subcutaneous QHS   insulin  aspart  0-9 Units Subcutaneous TID WC   insulin  glargine-yfgn  20  Units Subcutaneous Daily   metoprolol  tartrate  12.5 mg Oral BID   midodrine   10 mg Oral TID WC   multivitamin  1 tablet Oral QHS   sevelamer  carbonate  4,000 mg Oral TID WC   thiamine  (VITAMIN B1) injection  100 mg Intravenous Daily   Or   thiamine   100 mg Oral Daily    sodium chloride      acetaminophen  **OR** acetaminophen , ondansetron  **OR** ondansetron  (ZOFRAN ) IV, mouth rinse, mouth rinse, mouth rinse, sevelamer  carbonate

## 2023-11-27 DIAGNOSIS — I214 Non-ST elevation (NSTEMI) myocardial infarction: Secondary | ICD-10-CM | POA: Diagnosis not present

## 2023-11-27 DIAGNOSIS — K559 Vascular disorder of intestine, unspecified: Secondary | ICD-10-CM

## 2023-11-27 DIAGNOSIS — R197 Diarrhea, unspecified: Secondary | ICD-10-CM

## 2023-11-27 LAB — GLUCOSE, CAPILLARY
Glucose-Capillary: 211 mg/dL — ABNORMAL HIGH (ref 70–99)
Glucose-Capillary: 207 mg/dL — ABNORMAL HIGH (ref 70–99)
Glucose-Capillary: 178 mg/dL — ABNORMAL HIGH (ref 70–99)
Glucose-Capillary: 148 mg/dL — ABNORMAL HIGH (ref 70–99)

## 2023-11-27 LAB — HEMOGLOBIN AND HEMATOCRIT, BLOOD
HCT: 36.5 % — ABNORMAL LOW (ref 39.0–52.0)
HCT: 34 % — ABNORMAL LOW (ref 39.0–52.0)
HCT: 35 % — ABNORMAL LOW (ref 39.0–52.0)
Hemoglobin: 12 g/dL — ABNORMAL LOW (ref 13.0–17.0)
Hemoglobin: 11.4 g/dL — ABNORMAL LOW (ref 13.0–17.0)
Hemoglobin: 11.6 g/dL — ABNORMAL LOW (ref 13.0–17.0)

## 2023-11-27 LAB — TSH: TSH: 7.603 u[IU]/mL — ABNORMAL HIGH (ref 0.350–4.500)

## 2023-11-27 LAB — PREPARE RBC (CROSSMATCH)

## 2023-11-27 MED ORDER — ALBUMIN HUMAN 25 % IV SOLN
25.0000 g | INTRAVENOUS | Status: AC
  Administered 2023-11-27: 25 g via INTRAVENOUS
  Filled 2023-11-27: qty 100

## 2023-11-27 MED ORDER — PANTOPRAZOLE SODIUM 40 MG IV SOLR
40.0000 mg | Freq: Two times a day (BID) | INTRAVENOUS | Status: DC
  Administered 2023-11-27 – 2023-12-03 (×14): 40 mg via INTRAVENOUS
  Filled 2023-11-27 (×14): qty 10

## 2023-11-27 MED ORDER — SODIUM CHLORIDE 0.9 % IV BOLUS: 500.0000 mL | INTRAVENOUS | Status: DC

## 2023-11-27 MED ORDER — HEPARIN SODIUM (PORCINE) 1000 UNIT/ML DIALYSIS
3000.0000 [IU] | INTRAMUSCULAR | Status: AC | PRN
  Administered 2023-11-30: 3000 [IU] via INTRAVENOUS_CENTRAL
  Filled 2023-11-27: qty 3

## 2023-11-27 MED ORDER — SODIUM CHLORIDE 0.9% IV SOLUTION: Freq: Once | INTRAVENOUS | Status: AC

## 2023-11-27 MED ORDER — SODIUM CHLORIDE 0.9 % IV BOLUS
250.0000 mL | INTRAVENOUS | Status: AC
  Administered 2023-11-27: 250 mL via INTRAVENOUS

## 2023-11-27 MED ORDER — HEPARIN SODIUM (PORCINE) 1000 UNIT/ML DIALYSIS
1000.0000 [IU] | INTRAMUSCULAR | Status: DC | PRN
Start: 2023-11-27 — End: 2023-11-30

## 2023-11-27 MED ORDER — SODIUM CHLORIDE 0.9 % IV BOLUS: 250.0000 mL | INTRAVENOUS | Status: AC

## 2023-11-27 MED ORDER — HEPARIN SODIUM (PORCINE) 1000 UNIT/ML DIALYSIS
6000.0000 [IU] | Freq: Once | INTRAMUSCULAR | Status: DC
  Filled 2023-11-27: qty 6

## 2023-11-27 MED ORDER — ALTEPLASE 2 MG IJ SOLR: 2.0000 mg | Freq: Once | INTRAMUSCULAR | Status: DC | PRN

## 2023-11-27 MED ORDER — INSULIN GLARGINE-YFGN 100 UNIT/ML ~~LOC~~ SOLN
25.0000 [IU] | Freq: Every day | SUBCUTANEOUS | Status: DC
  Administered 2023-11-28 – 2023-12-15 (×15): 25 [IU] via SUBCUTANEOUS
  Filled 2023-11-27 (×18): qty 0.25

## 2023-11-27 MED ORDER — NEPRO/CARBSTEADY PO LIQD: 237.0000 mL | ORAL | Status: DC | PRN

## 2023-11-27 MED ORDER — LIDOCAINE HCL (PF) 1 % IJ SOLN: 5.0000 mL | INTRAMUSCULAR | Status: DC | PRN

## 2023-11-27 MED ORDER — MIDODRINE HCL 5 MG PO TABS
10.0000 mg | ORAL_TABLET | ORAL | Status: DC
Start: 2023-11-27 — End: 2023-11-27

## 2023-11-27 MED ORDER — ANTICOAGULANT SODIUM CITRATE 4% (200MG/5ML) IV SOLN: 5.0000 mL | Status: DC | PRN

## 2023-11-27 MED ORDER — PENTAFLUOROPROP-TETRAFLUOROETH EX AERO: 1.0000 | INHALATION_SPRAY | CUTANEOUS | Status: DC | PRN

## 2023-11-27 MED ORDER — LIDOCAINE-PRILOCAINE 2.5-2.5 % EX CREA
1.0000 | TOPICAL_CREAM | CUTANEOUS | Status: DC | PRN
Start: 2023-11-27 — End: 2023-11-30

## 2023-11-27 MED ORDER — MIDODRINE HCL 5 MG PO TABS
20.0000 mg | ORAL_TABLET | ORAL | Status: AC
  Filled 2023-11-27: qty 4

## 2023-11-27 MED ORDER — MIDODRINE HCL 5 MG PO TABS
20.0000 mg | ORAL_TABLET | ORAL | Status: AC
  Administered 2023-11-27: 20 mg via ORAL
  Filled 2023-11-27: qty 4

## 2023-11-27 NOTE — Plan of Care (Signed)

## 2023-11-27 NOTE — Progress Notes (Signed)
 PT Cancellation Note  Patient Details Name: [name] DAUGHDRILL Sr. MRN: [id number] DOB: May 13, 1961   Cancelled Treatment:    Reason Eval/Treat Not Completed: Patient at procedure or test/unavailable (pt off unit for HD. will follow up at later date/time as schedule allows/pt able.)    Vernell KNIGHTS PT, DPT Acute Rehabilitation Services Office [phone]  11/27/23 1:07 PM

## 2023-11-27 NOTE — Procedures (Signed)
 Procedure note [facility]  Inpatient hemodialysis  I was present at the procedure, reviewed the HD regimen and made appropriate changes.   [name] MD  [facility] 11/27/2023, 5:49 PM   BP (!) 88/52 (BP Location: Right Arm)   Pulse 78   Temp 98.4 F (36.9 C) (Oral)   Resp 20   Ht 6' 1 (1.854 m)   Wt 103.4 kg   SpO2 95%   BMI 30.08 kg/m      Inpatient medications:  aspirin   81 mg Oral Daily   atorvastatin   40 mg Oral Daily   Chlorhexidine  Gluconate Cloth  6 each Topical Q0600   cinacalcet   30 mg Oral Q breakfast   clopidogrel   75 mg Oral Daily   doxercalciferol   3 mcg Intravenous Q M,W,F-HD   feeding supplement  237 mL Oral BID BM   heparin  injection (subcutaneous)  5,000 Units Subcutaneous Q8H   insulin  aspart  0-5 Units Subcutaneous QHS   insulin  aspart  0-9 Units Subcutaneous TID WC   insulin  glargine-yfgn  20 Units Subcutaneous Daily   metoprolol  tartrate  12.5 mg Oral BID   midodrine   10 mg Oral TID WC   multivitamin  1 tablet Oral QHS   sevelamer  carbonate  4,000 mg Oral TID WC   thiamine  (VITAMIN B1) injection  100 mg Intravenous Daily    Or   thiamine   100 mg Oral Daily         sodium chloride          acetaminophen  **OR** acetaminophen , ondansetron  **OR** ondansetron  (ZOFRAN ) IV, mouth rinse, mouth rinse, mouth rinse, sevelamer  carbonate

## 2023-11-27 NOTE — Progress Notes (Addendum)
 Progress Note  Patient Name: [name] AYARS Sr. Date of Encounter: 11/27/2023  Primary Cardiologist: Jayson Sierras, MD  Subjective   Patient more somnolent today but easily arousable; only answers with brief yes or no answers. GI note 1/9 indicated he was somnolent on their afternoon eval as well. Overnight concerns for GIB noted. He denies CP, SOB, abdominal pain. No edema. Lying flat without labored breathing.  Inpatient Medications    Scheduled Meds:  aspirin   81 mg Oral Daily   atorvastatin   40 mg Oral Daily   Chlorhexidine  Gluconate Cloth  6 each Topical Q0600   Chlorhexidine  Gluconate Cloth  6 each Topical Q0600   cinacalcet   30 mg Oral Q breakfast   doxercalciferol   3 mcg Intravenous Q M,W,F-HD   feeding supplement  237 mL Oral BID BM   [START ON 11/28/2023] heparin   6,000 Units Dialysis Once in dialysis   insulin  aspart  0-5 Units Subcutaneous QHS   insulin  aspart  0-9 Units Subcutaneous TID WC   insulin  glargine-yfgn  20 Units Subcutaneous Daily   midodrine   10 mg Oral TID WC   midodrine   20 mg Oral STAT   multivitamin  1 tablet Oral QHS   pantoprazole  (PROTONIX ) IV  40 mg Intravenous BID   sevelamer  carbonate  4,000 mg Oral TID WC   thiamine  (VITAMIN B1) injection  100 mg Intravenous Daily   Or   thiamine   100 mg Oral Daily   Continuous Infusions:  sodium chloride      albumin  human     anticoagulant sodium citrate      sodium chloride      PRN Meds: acetaminophen  **OR** acetaminophen , alteplase , anticoagulant sodium citrate , feeding supplement (NEPRO CARB STEADY), heparin , [START ON 11/28/2023] heparin , lidocaine  (PF), lidocaine -prilocaine , ondansetron  **OR** ondansetron  (ZOFRAN ) IV, mouth rinse, mouth rinse, mouth rinse, pentafluoroprop-tetrafluoroeth, sevelamer  carbonate   Vital Signs    Vitals:   11/27/23 0630 11/27/23 0700 11/27/23 0758 11/27/23 0923  BP: (!) 90/37 (!) 107/48 124/61 134/75  Pulse: 66 65 67 69  Resp: 19 10 16 19   Temp:   98.2 F (36.8  C) 98.1 F (36.7 C)  TempSrc:   Oral Oral  SpO2: 97% 94% 100%   Weight:      Height:        Intake/Output Summary (Last 24 hours) at 11/27/2023 1030 Last data filed at 11/27/2023 0920 Gross per 24 hour  Intake 1454.01 ml  Output --  Net 1454.01 ml      11/27/2023    3:39 AM 11/26/2023    4:55 AM 11/25/2023    3:14 PM  Last 3 Weights  Weight (lbs) 213 lb 3 oz 227 lb 15.3 oz 218 lb 4.1 oz  Weight (kg) 96.7 kg 103.4 kg 99 kg     Telemetry    NSR - Personally Reviewed  Physical Exam   GEN: No acute distress.  HEENT: Normocephalic, atraumatic, sclera non-icteric.  Neck: No JVD or bruits. Cardiac: Distant heart sounds RRR soft SEM no rubs or gallops.  Respiratory: Clear to auscultation bilaterally. Breathing is unlabored. GI: Soft, nontender, non-distended, BS +x 4. MS: no deformity. Extremities: No clubbing or cyanosis. No edema. R BKA Neuro:  More somnolent today but arousable, answers brief yes or no questions. Relayed this to IM to evaluate. Psych:  Blunted affect   Labs    High Sensitivity Troponin:   Recent Labs  Lab 11/09/23 1111 11/09/23 1314 11/10/23 0907 11/10/23 1503 11/11/23 0001  TROPONINIHS 1,557* 1,150* 8,942* 7,282* 7,672*  Cardiac EnzymesNo results for input(s): TROPONINI in the last 168 hours. No results for input(s): TROPIPOC in the last 168 hours.   Chemistry Recent Labs  Lab 11/20/23 1214 11/22/23 1044 11/23/23 0840 11/25/23 0257  NA  --  135 133* 131*  K  --  4.4 4.6 4.9  CL  --  92* 89* 87*  CO2  --  21* 21* 27  GLUCOSE  --  200* 276* 265*  BUN  --  61* 78* 63*  CREATININE  --  11.72* 13.17* 11.32*  CALCIUM   --  10.4* 10.1 9.9  ALBUMIN  4.0 3.3* 3.1*  --   GFRNONAA  --  4* 4* 5*  ANIONGAP  --  22* 23* 17*     Hematology Recent Labs  Lab 11/22/23 1044 11/23/23 0840 11/25/23 1108 11/27/23 0435  WBC 9.5 9.0 8.9  --   RBC 5.61 4.91 4.89  --   HGB 16.2 14.2 14.0 12.0*  HCT 49.9 43.7 42.7 36.5*  MCV 88.9 89.0 87.3  --    MCH 28.9 28.9 28.6  --   MCHC 32.5 32.5 32.8  --   RDW 16.7* 16.3* 15.8*  --   PLT 367 400 387  --     BNPNo results for input(s): BNP, PROBNP in the last 168 hours.   DDimer No results for input(s): DDIMER in the last 168 hours.   Radiology    No results found.  Cardiac Studies   2d echo 11/09/23  1. Left ventricular ejection fraction, by estimation, is 30 to 35%. The  left ventricle has moderately decreased function. The left ventricle  demonstrates regional wall motion abnormalities (see scoring  diagram/findings for description). There is  moderate concentric left ventricular hypertrophy. Left ventricular  diastolic parameters are consistent with Grade III diastolic dysfunction  (restrictive).   2. Right ventricular systolic function is mildly reduced. The right  ventricular size is normal. There is mildly elevated pulmonary artery  systolic pressure. The estimated right ventricular systolic pressure is  42.2 mmHg.   3. Left atrial size was moderately dilated.   4. The mitral valve is degenerative. Trivial mitral valve regurgitation.  Moderate mitral annular calcification.   5. The aortic valve is tricuspid. There is moderate calcification of the  aortic valve. Aortic valve regurgitation is not visualized. Moderate to  severe aortic valve stenosis - suspect upper end of scale, low flow/low  gradient. Aortic valve area, by VTI  measures 0.98 cm. Aortic valve mean gradient measures 22.0 mmHg.  Dimentionless index 0.19.   6. Aortic dilatation noted. There is borderline dilatation of the aortic  root, measuring 40 mm.   7. The inferior vena cava is dilated in size with <50% respiratory  variability, suggesting right atrial pressure of 15 mmHg.   Comparison(s): Prior images reviewed side by side. LVEF has decreased,  30-35% range with basal inferoposterior akinesis and restrictive diastolic  filling. Aortic stenosis possibly severe (low flow/low gradient). Mildly   elevated RVSP.   Patient Profile     63 y.o. male with ESRD on HD MW, DM2, HTN, NICM, chronic HFrEF, prior R BKA, obesity admitted 11/08/2023 with episode of chest pain, hypotension and near syncope during HD. Found to have NSTEMI and altered mental status. Cath deferred due to encephalopathy, influenza A. Hospital course notable for drop in EF to 30-35% with possible severe AS, diarrhea possibly abx related, and heme occult positive stool.  Assessment & Plan    1. Suspected NSTEMI, prior nonbstructive CAD, admission complicated  by encephalopathy, flu A, norovirus, + FOBT  - Patient previously had cardiac catheterization in [place] in 2019 due to LV dysfunction (EF 25%), found to have 30% stenosis in prox LAD and distal RCA. Was managed medically at the time. Later, underwent nuclear stress test in 05/2021 through [facility]- There is a moderate sized, severe, perfusion defect involving the apical, apical inferior, mid inferior, and basal inferior segments. The defects of the involved segments are nearly completely reversible. This is consistent with probable ischemia. He was again managed medically. - Patient presented after having near syncope during HD. HsTrop peaked 8942. Completed 48 hours IV heparin  - Cath deferred due to initial issues with encephalopathy requiring medical workup - Cardiology asked to revisit cath plans this week, complicated by concern for GIB. GI originally planned conservative rx and gave OK for antiplatelet therapy as needed. We trialed addition of Plavix  to ASA starting yesterday but he developed recurrent GIB overnight. Need to hold antiplatelets until evaluated by GI - Given this development we are not planning inpatient ischemic evaluation. This can be revisited as outpatient when GI issues stabilize and cleared to utilize DAPT going forward. - continue atorvastatin  40mg  daily (LDL 39)   2. Chronic HFrEF complicated by moderate-severe aortic valve stenosis  - Echocardiogram  11/09/23 showed EF 30-35% with regional wall motion abnormalities, grade III DD, mildly reduced RV systolic function, moderate-severe aortic valve stenosis  - GDMT limited by low BP, ESRD  - On midodrine  for BP support  - will need to continue to follow outpatient for management   3. NSVT  - Improved after addition of BB- NSR - TSH abnormal - recommend further mgmt per primary team - Lyte mgmt per nephro with HD   5. Medical issues: AMS, + influenza A, Norovirus, GI bleed - multiple physiologic stressors - relayed somnolent affect today to IM who also saw patient this AM - followed by neuro, ID initially, now IM + GI  Anticipate cardiology may sign off; will confirm with Dr. Santo. Recommend primary team notify cardiology when patient is discharge so we may arrange follow-up.  For questions or updates, please contact [facility] Please consult [email or web address] for contact info under Cardiology/STEMI.  Signed, Raphael LOISE Bring, PA-C 11/27/2023, 10:30 AM   Personally seen and examined. Agree with APP above with the following comments: Patient is much more alert and awake.  Still having dark stools.  No vomiting.  No CP.   NAD.  AOX4.  No tachypea.  Affect has recovered.  Rest as above. Normal rhythm. He has shown that he is able to tolerate DAPT.  He will not be able to tolerate cath/intervention.  Will medical manage NSTEM. Continue his cardiac medications save for DAPT. On BB he has no further NSVT.   Will sign off.  Peri-discharge we can arrange f/u.  Stanly Santo, MD FASE [facility] Cardiologist [facility]  [address] [place], [address] [place], [place] [address] [phone]  1:16 PM

## 2023-11-27 NOTE — Plan of Care (Signed)
  Problem: Clinical Measurements: Goal: Cardiovascular complication will be avoided Outcome: Progressing   Problem: Skin Integrity: Goal: Risk for impaired skin integrity will decrease Outcome: Progressing   Problem: Fluid Volume: Goal: Ability to maintain a balanced intake and output will improve Outcome: Progressing   Problem: Metabolic: Goal: Ability to maintain appropriate glucose levels will improve Outcome: Progressing   Problem: Education: Goal: Ability to identify signs and symptoms of gastrointestinal bleeding will improve Outcome: Progressing   Problem: Bowel/Gastric: Goal: Will show no signs and symptoms of gastrointestinal bleeding Outcome: Progressing   Problem: Fluid Volume: Goal: Will show no signs and symptoms of excessive bleeding Outcome: Progressing   Problem: Clinical Measurements: Goal: Complications related to the disease process, condition or treatment will be avoided or minimized Outcome: Progressing

## 2023-11-27 NOTE — Progress Notes (Signed)
 PROGRESS NOTE  [name] FAIOLA Sr.  DOB: 02-25-1961  PCP: Freddrick Johns FMW:[id number]  DOA: 11/08/2023  LOS: 19 days  Hospital Day: 20  Brief narrative: [name] [name] is a 63 y.o. male with PMH significant for ESRD-HD-MWF, DM2, HTN, nonischemic cardiomyopathy, mild systolic CHF, prior right BKA 87/77, patient was brought to the ED after experiencing an episode of chest pain, hypotension and near syncope during hemodialysis.  His initial blood pressure was low in 75/59 On further workup in the ED, troponin was significantly elevated and peaked at 9000 He was diagnosed with NSTEMI, started on heparin  drip. Admitted to [facility]  Seen by cardiology  See below for details  Hospital course was also complicated altered mental status.  Found to have influenza A positive.  Mental status gradually improved Currently followed by GI and cardiology  Subjective: Patient was seen and examined this morning.  Pleasant middle-aged African-American male.  Lying on bed.  Looks somnolent today.  Not in distress.  Family not at bedside. Event from last night noted. Patient had 3 large dark and red stools and became hypotensive. GI and cardiology follow-up appreciated.  Assessment and plan: NSTEMI Presented with chest pain, hypertension, syncope, significantly elevated troponin  12/23 echocardiogram showed EF low at 30 to 35% with regional WMA, moderate to severe aortic stenosis Initially started on medical management with IV heparin  drip, aspirin  and Lipitor Cardiology wanted to hold off on further invasive study until mental status improved. Cardiology was reconsulted on 1/7.  Plavix  was added on 1/9. With large amount of GI bleeding last night, both aspirin  and Plavix  are now held.  Since he has shown that he will not be able to tolerate DAPT, cardiology does not plan for cardiac cath/intervention.  Medical management planned.  Acute GI bleeding  Norovirus diarrhea GI pathogen panel positive for  norovirus.  C. difficile negative.   Ongoing diarrhea and he started having blood in stool since last time.  Hemoglobin was normal but now is dropping.  It seems patient got 1 unit of PRBC transfusion this morning.   GI following.  Per GI, likely etiology of bloody diarrhea.  Ischemic colitis in the setting of severe hypotension.   Per GI, no plan of endoscopy evaluation at this time. Aspirin  and Plavix  have been stopped Continue diet as tolerated Recent Labs    12/17/22 1244 12/17/22 1245 10/22/23 2343 11/16/23 1518 11/17/23 0245 11/22/23 1044 11/23/23 0840 11/24/23 1306 11/25/23 1108 11/27/23 0435 11/27/23 1319  HGB  --   --    < >  --    < > 16.2 14.2  --  14.0 12.0* 11.4*  MCV  --   --    < >  --    < > 88.9 89.0  --  87.3  --   --   VITAMINB12  --   --   --  2,058*  --   --   --   --   --   --   --   FOLATE  --   --   --  16.6  --   --   --   --   --   --   --   FERRITIN 1,481*  --   --   --   --   --   --  421*  --   --   --   TIBC  --  190*  --   --   --   --   --  294  --   --   --   IRON   --  72  --   --   --   --   --  46  --   --   --    < > = values in this interval not displayed.    Acute exacerbation of combined systolic and diastolic CHF Moderate to severe aortic stenosis Echo from 10/1811/23 compared.  Noted a drop in EF from 45-50% to 30-35%, along with grade 3 diastolic dysfunction as well as moderate to severe aortic valve stenosis. Cardiology following Currently on metoprolol  12.5 mg twice daily and midodrine  10 mg 3 times daily.   Also on dialysis for fluid management  ESRD-HD-MWF Nephrology on board  Influenza A positive Detected on 12/30.  Completed the course of Tamiflu .  Acute metabolic encephalopathy Patient hospital stay was complicated by altered mental status.  There was initial concern for encephalitis vs meningitis. Neurology was consulted.  Patient while started empirically on broad-spectrum antibiotics, antiviral. 12/27 and 1/1, EEG did  not show any evidence of seizure activity. 1/3, MRI brain did not any acute abnormality. 1/3, lumbar puncture done. CSF labs significant for elevated glucose and protein, not consistent with meningitis; Meningitis/encephalitis panel negative for pathogen. Antibiotics were stopped. Neurology recommendation for continued supportive care with anticipation that patient will have a prolonged recovery of encephalopathy related to hyperglycemia.  ID and neurology signed off. Mental status gradually improving  Type 2 diabetes mellitus uncontrolled A1c more than 15.51 10/24/2023 Currently on Semglee  20 units daily SSI/Accu-Cheks.  Blood sugar level mostly over 200.  I will increase Semglee  to 25 units from tomorrow.  Continue to monitor. Recent Labs  Lab 11/26/23 1152 11/26/23 1645 11/26/23 2133 11/27/23 0555 11/27/23 1133  GLUCAP 238* 220* 200* 211* 207*   H/o CVA Continue ASA, and Lipitor   Mobility: PT eval obtained.  SNF recommended  Goals of care   Code Status: Full Code     DVT prophylaxis:  Place and maintain sequential compression device Start: 11/20/23 0706 SCDs Start: 11/09/23 1558   Antimicrobials: Completed a course Fluid: None Consultants: GI, cardiology, nephrology Family Communication: Family not at bedside  Status: Inpatient Level of care:  Progressive   Patient is from: Home Needs to continue in-hospital care: Ongoing diarrhea and blood in the stool. Anticipated d/c to: SNF recommended by PT  Diet:  Diet Order             DIET SOFT Fluid consistency: Thin; Fluid restriction: 2000 mL Fluid  Diet effective now                   Scheduled Meds:  atorvastatin   40 mg Oral Daily   Chlorhexidine  Gluconate Cloth  6 each Topical Q0600   Chlorhexidine  Gluconate Cloth  6 each Topical Q0600   cinacalcet   30 mg Oral Q breakfast   doxercalciferol   3 mcg Intravenous Q M,W,F-HD   feeding supplement  237 mL Oral BID BM   [START ON 11/28/2023] heparin   6,000  Units Dialysis Once in dialysis   insulin  aspart  0-5 Units Subcutaneous QHS   insulin  aspart  0-9 Units Subcutaneous TID WC   [START ON 11/28/2023] insulin  glargine-yfgn  25 Units Subcutaneous Daily   midodrine   10 mg Oral TID WC   midodrine   20 mg Oral STAT   multivitamin  1 tablet Oral QHS   pantoprazole  (PROTONIX ) IV  40 mg Intravenous BID   sevelamer  carbonate  4,000 mg Oral  TID WC   thiamine   100 mg Oral Daily    PRN meds: acetaminophen  **OR** acetaminophen , alteplase , anticoagulant sodium citrate , feeding supplement (NEPRO CARB STEADY), heparin , [START ON 11/28/2023] heparin , lidocaine  (PF), lidocaine -prilocaine , ondansetron  **OR** ondansetron  (ZOFRAN ) IV, mouth rinse, mouth rinse, mouth rinse, pentafluoroprop-tetrafluoroeth, sevelamer  carbonate   Infusions:   sodium chloride      anticoagulant sodium citrate      sodium chloride       Antimicrobials: Anti-infectives (From admission, onward)    Start     Dose/Rate Route Frequency Ordered Stop   11/18/23 1800  oseltamivir  (TAMIFLU ) capsule 30 mg        30 mg Oral Every M-W-F (Hemodialysis) 11/18/23 1116 11/23/23 1159   11/17/23 1800  oseltamivir  (TAMIFLU ) capsule 30 mg  Status:  Discontinued        30 mg Oral Every T-Th-Sa (1800) 11/17/23 0800 11/18/23 1116   11/17/23 1115  vancomycin  (VANCOCIN ) IVPB 1000 mg/200 mL premix  Status:  Discontinued        1,000 mg 200 mL/hr over 60 Minutes Intravenous Once in dialysis 11/17/23 1021 11/18/23 1331   11/17/23 1000  oseltamivir  (TAMIFLU ) capsule 30 mg        30 mg Oral  Once 11/17/23 0759 11/17/23 0952   11/16/23 1200  vancomycin  (VANCOCIN ) IVPB 1000 mg/200 mL premix  Status:  Discontinued        1,000 mg 200 mL/hr over 60 Minutes Intravenous Every M-W-F (Hemodialysis) 11/15/23 1658 11/18/23 1306   11/12/23 2300  vancomycin  (VANCOCIN ) IVPB 1000 mg/200 mL premix        1,000 mg 200 mL/hr over 60 Minutes Intravenous  Once 11/12/23 2226 11/13/23 0131   11/12/23 2000  cefTRIAXone   (ROCEPHIN ) 2 g in sodium chloride  0.9 % 100 mL IVPB  Status:  Discontinued        2 g 200 mL/hr over 30 Minutes Intravenous Every 12 hours 11/12/23 1627 11/18/23 1306   11/12/23 2000  ampicillin  (OMNIPEN) 2 g in sodium chloride  0.9 % 100 mL IVPB  Status:  Discontinued        2 g 300 mL/hr over 20 Minutes Intravenous Every 12 hours 11/12/23 1627 11/18/23 1306   11/12/23 1730  acyclovir  (ZOVIRAX ) 450 mg in dextrose  5 % 100 mL IVPB  Status:  Discontinued        5 mg/kg  89.6 kg (Adjusted) 109 mL/hr over 60 Minutes Intravenous Every 24 hours 11/12/23 1627 11/18/23 1306   11/11/23 1915  piperacillin -tazobactam (ZOSYN ) IVPB 2.25 g  Status:  Discontinued        2.25 g 100 mL/hr over 30 Minutes Intravenous Every 8 hours 11/11/23 1825 11/12/23 1615   11/11/23 1245  metroNIDAZOLE  (FLAGYL ) IVPB 500 mg  Status:  Discontinued        500 mg 100 mL/hr over 60 Minutes Intravenous Every 12 hours 11/11/23 1146 11/11/23 1825   11/11/23 1200  vancomycin  (VANCOCIN ) IVPB 1000 mg/200 mL premix  Status:  Discontinued        1,000 mg 200 mL/hr over 60 Minutes Intravenous Every M-W-F (Hemodialysis) 11/10/23 1458 11/15/23 1658   11/10/23 1545  vancomycin  (VANCOCIN ) IVPB 1000 mg/200 mL premix       Placed in Followed by Linked Group   1,000 mg 200 mL/hr over 60 Minutes Intravenous  Once 11/10/23 1458 11/10/23 1917   11/10/23 1545  vancomycin  (VANCOCIN ) IVPB 1000 mg/200 mL premix       Placed in Followed by Linked Group   1,000 mg 200 mL/hr over 60  Minutes Intravenous  Once 11/10/23 1458 11/10/23 1815   11/10/23 1545  ceFEPIme  (MAXIPIME ) 1 g in sodium chloride  0.9 % 100 mL IVPB  Status:  Discontinued        1 g 200 mL/hr over 30 Minutes Intravenous Every M-W-F 11/10/23 1458 11/10/23 1520   11/10/23 1545  ceFEPIme  (MAXIPIME ) 2 g in sodium chloride  0.9 % 100 mL IVPB  Status:  Discontinued        2 g 200 mL/hr over 30 Minutes Intravenous Every M-W-F 11/10/23 1520 11/11/23 1810       Objective: Vitals:    11/27/23 1438 11/27/23 1508  BP: 115/82 118/74  Pulse: 78 76  Resp: 20 20  Temp:    SpO2: 99% 97%    Intake/Output Summary (Last 24 hours) at 11/27/2023 1542 Last data filed at 11/27/2023 0920 Gross per 24 hour  Intake 1454.01 ml  Output --  Net 1454.01 ml   Filed Weights   11/25/23 1514 11/26/23 0455 11/27/23 0339  Weight: 99 kg 103.4 kg 96.7 kg   Weight change: -1.6 kg Body mass index is 28.13 kg/m.   Physical Exam: General exam: Pleasant, middle-aged African-American male.  Not in distress Skin: No rashes, lesions or ulcers. HEENT: Atraumatic, normocephalic, no obvious bleeding Lungs: Clear to auscultation bilaterally,  CVS: S1, S2, no murmur,   GI/Abd: Soft, nontender, nondistended, bowel sound present,   CNS: Somnolent, opens eyes on command.  Not in distress. Psychiatry: Mood appropriate,  Extremities: No pedal edema, no calf tenderness,   Data Review: I have personally reviewed the laboratory data and studies available.  F/u labs  Unresulted Labs (From admission, onward)     Start     Ordered   11/27/23 0318  Hemoglobin and hematocrit, blood  Now then every 6 hours,   R     Question:  Specimen collection method  Answer:  Lab=Lab collect   11/27/23 0317   11/19/23 1134  Oligoclonal bands, CSF + serum  (Oligoclonal Bands, CSF + Serum panel)  Once,   R        11/19/23 1134   11/19/23 1134  Draw extra clot tube  (Oligoclonal Bands, CSF + Serum panel)  Once,   R       Question:  Specimen collection method  Answer:  Lab=Lab collect   11/19/23 1134   11/19/23 1134  Draw extra clot tube  (IgG CSF Index, CSF + Serum panel)  Once,   R       Question:  Specimen collection method  Answer:  Lab=Lab collect   11/19/23 1134   11/19/23 1134  Miscellaneous [facility] test (send-out)  Once,   R       Comments: Autoimmune Encephalitis Ab panel - CSF   Question Answer Comment  Test name / description: Autoimmune Encephalitis Ab panel - CSF   Release to patient Immediate       11/19/23 1134   11/13/23 0618  CBC  Daily,   R     Question:  Specimen collection method  Answer:  Lab=Lab collect   11/13/23 0617   Unscheduled  Occult blood card to lab, stool  As needed,   R      11/19/23 1729            Total time spent in review of labs and imaging, patient evaluation, formulation of plan, documentation and communication with family: 45 minutes  Signed, [name] Rota, MD [facility] 11/27/2023

## 2023-11-27 NOTE — Progress Notes (Signed)
   11/27/23 1713  Vitals  Temp 97.7 F (36.5 C)  Pulse Rate 82  Resp 15  BP (!) 119/58  SpO2 98 %  O2 Device Room Air  Post Treatment  Dialyzer Clearance Lightly streaked  Liters Processed 66.7  Fluid Removed (mL) 0 mL  Tolerated HD Treatment Yes   Received patient in bed to unit.  Alert and oriented.  Informed consent signed and in chart.   TX duration:3.0 hours  Patient tolerated well.  Transported back to the room  Alert, without acute distress.  Hand-off given to patient's nurse.   Access used: lAVF Access issues: None  Total UF removed: 0 Medication(s) given: See MAR  Geni Seip, RN [facility]

## 2023-11-27 NOTE — Progress Notes (Addendum)
 Received a call from bedside RN regarding the patient having recurrent GI bleed with 3 large dark and bloody stools and becoming hypotensive.  Presented at bedside.  The patient placed in Trendelenburg position due to hypotension.  Denies having any pain.  IV PPI twice daily, 250 cc IV fluid NS boluses x 2, IV albumin  25 g x 1, and 20 mg of midodrine  ordered to maintain MAP above 65.    Due to concern for hemorrhagic shock 1 unit PRBCs ordered to be transfused.  Serial H&H every 6 hours x 3.  [facility] made aware of recurrent GI bleed.  Will continue to closely monitor and treat as indicated.   Time: 15 minutes.

## 2023-11-27 NOTE — Progress Notes (Signed)
 MEWS Progress Note  Patient Details Name: [name] SARGENT Sr. MRN: [id number] DOB: June 03, 1961 Today's Date: 11/27/2023   MEWS Flowsheet Documentation:  Assess: MEWS Score Temp: 98 F (36.7 C) BP: (!) 76/43 MAP (mmHg): (!) 54 Pulse Rate: 67 ECG Heart Rate: 67 Resp: 15 Level of Consciousness: Alert SpO2: 97 % O2 Device: Room Air Patient Activity (if Appropriate): In bed O2 Flow Rate (L/min): 1 L/min FiO2 (%): (!) 3 % Assess: MEWS Score MEWS Temp: 0 MEWS Systolic: 2 MEWS Pulse: 0 MEWS RR: 0 MEWS LOC: 0 MEWS Score: 2 MEWS Score Color: Yellow Assess: SIRS CRITERIA SIRS Temperature : 0 SIRS Respirations : 0 SIRS Pulse: 0 SIRS WBC: 0 SIRS Score Sum : 0 SIRS Temperature : 0 SIRS Pulse: 0 SIRS Respirations : 0 SIRS WBC: 0 SIRS Score Sum : 0 Assess: if the MEWS score is Yellow or Red Were vital signs accurate and taken at a resting state?: Yes Does the patient meet 2 or more of the SIRS criteria?: No MEWS guidelines implemented : Yes, yellow Treat MEWS Interventions: Considered administering scheduled or prn medications/treatments as ordered Take Vital Signs Increase Vital Sign Frequency : Yellow: Q2hr x1, continue Q4hrs until patient remains green for 12hrs Escalate MEWS: Escalate: Yellow: Discuss with charge nurse and consider notifying provider and/or RRT Notify: Charge Nurse/RN Name of Charge Nurse/RN Notified: Donnice, RN Provider Notification Provider Name/Title: Terry Hurst, MD Date Provider Notified: 11/27/23 Time Provider Notified: 0354 Method of Notification: Page Notification Reason: Other (Comment) (Pt had another episode of bloody BM. Bp is low 86/47 with a MAP of 59, recheck is 76/43 with a MAP of 54. FYI) Provider response: See new orders Date of Provider Response: 11/27/23 Time of Provider Response: 0310      Janece Laidlaw Blanche C Shanyn Preisler 11/27/2023, 3:57 AM

## 2023-11-28 DIAGNOSIS — A0811 Acute gastroenteropathy due to Norwalk agent: Secondary | ICD-10-CM

## 2023-11-28 DIAGNOSIS — K922 Gastrointestinal hemorrhage, unspecified: Secondary | ICD-10-CM | POA: Diagnosis not present

## 2023-11-28 DIAGNOSIS — I214 Non-ST elevation (NSTEMI) myocardial infarction: Secondary | ICD-10-CM | POA: Diagnosis not present

## 2023-11-28 DIAGNOSIS — G9341 Metabolic encephalopathy: Secondary | ICD-10-CM | POA: Diagnosis not present

## 2023-11-28 DIAGNOSIS — N186 End stage renal disease: Secondary | ICD-10-CM | POA: Diagnosis not present

## 2023-11-28 LAB — TYPE AND SCREEN
ABO/RH(D): A POS
Antibody Screen: NEGATIVE
Unit division: 0

## 2023-11-28 LAB — GLUCOSE, CAPILLARY
Glucose-Capillary: 121 mg/dL — ABNORMAL HIGH (ref 70–99)
Glucose-Capillary: 179 mg/dL — ABNORMAL HIGH (ref 70–99)
Glucose-Capillary: 160 mg/dL — ABNORMAL HIGH (ref 70–99)
Glucose-Capillary: 195 mg/dL — ABNORMAL HIGH (ref 70–99)

## 2023-11-28 LAB — BPAM RBC
Blood Product Expiration Date: 202501272359
ISSUE DATE / TIME: 202501100544
Unit Type and Rh: 6200

## 2023-11-28 NOTE — Progress Notes (Signed)
 Physical Therapy Treatment Patient Details Name: [name] DOLINSKY Sr. MRN: [id number] DOB: 02-06-1961 Today's Date: 11/28/2023   History of Present Illness Pt is 63 year old presented to [facility] on  11/08/23 after experiencing a near syncope during HD. Pt being worked up for an NSTEMI pt unable to tolerate medication and no longer plan to perform cardiac cath, and fever with acute metabolic encephalopathy.  Pt recently admitted 12/6-12/10/24 for a fall. PMH - htn, cardiomyopathy, CHF, dm, esrd on hd, R BKA, CVA    PT Comments  Pt is making very slow progress towards goals. Pt continues to be limited due to cognition. Pt was able to perform rolling at Min A with use of bed rails, bed mobility at Min-Mod A difficulty sequencing. Due to pt current functional status, home set up and available assistance at home recommending skilled physical therapy services < 3 hours/day in order to address strength, balance and functional mobility to decrease risk for falls, injury, immobility, skin break down and re-hospitalization.     If plan is discharge home, recommend the following: Two people to help with walking and/or transfers;Assistance with cooking/housework;Assist for transportation;Help with stairs or ramp for entrance;Supervision due to cognitive status     Equipment Recommendations  Other (comment);Hoyer lift;Wheelchair (measurements PT);Wheelchair cushion (measurements PT);Hospital bed (defer to post acute)       Precautions / Restrictions Precautions Precautions: Fall Required Braces or Orthoses: Other Brace Other Brace: R prosthetic (not sure if fitting) Restrictions Weight Bearing Restrictions Per Provider Order: No RLE Weight Bearing Per Provider Order: Non weight bearing Other Position/Activity Restrictions: R BKA     Mobility  Bed Mobility Overal bed mobility: Needs Assistance Bed Mobility: Rolling, Sidelying to Sit, Sit to Sidelying Rolling: Min assist, Used rails Sidelying to sit: Mod  assist, Used rails     Sit to sidelying: Min assist, Used rails General bed mobility comments: pt continues with better activation of RLE than L. Pt was able to assist using rails this session with intermittent verbal cues for sequencing in order to decrease physical assist required for mobility. Pt was able to sit EOB for ~3 min    Transfers   General transfer comment: unsafe to attempt    Ambulation/Gait   General Gait Details: unable      Balance Overall balance assessment: Needs assistance Sitting-balance support: Feet supported, Bilateral upper extremity supported Sitting balance-Leahy Scale: Fair Sitting balance - Comments: Once in sitting pt was able to sit at close SBA       Standing balance comment: unable to get to standing.      Cognition Arousal: Alert Behavior During Therapy: WFL for tasks assessed/performed Overall Cognitive Status: Impaired/Different from baseline       Following Commands: Follows one step commands with increased time, Follows one step commands consistently Safety/Judgement: Decreased awareness of safety, Decreased awareness of deficits   Problem Solving: Slow processing, Decreased initiation, Requires verbal cues, Difficulty sequencing General Comments: Pt continues to perseverate on anything but mobility. Pt asked to call daughter and for PT to scratch his back before agreeing to participate in functional mobility.           General Comments General comments (skin integrity, edema, etc.): pt has some edema on the R posterior hand and L antebrachium with what look like old IV sites that pt reports are sore to touch. No warmth or redness noted.      Pertinent Vitals/Pain Pain Assessment Pain Assessment: Faces Faces Pain Scale: Hurts little more  Breathing: normal Negative Vocalization: none Facial Expression: smiling or inexpressive Body Language: relaxed Consolability: no need to console PAINAD Score: 0 Pain Location: R  residual limb, R and L hand with touch Pain Descriptors / Indicators: Guarding Pain Intervention(s): Limited activity within patient's tolerance, Monitored during session, Patient requesting pain meds-RN notified     PT Goals (current goals can now be found in the care plan section) Acute Rehab PT Goals Patient Stated Goal: unable to state PT Goal Formulation: Patient unable to participate in goal setting Time For Goal Achievement: 11/28/23 Potential to Achieve Goals: Fair Progress towards PT goals: Progressing toward goals    Frequency    Min 1X/week      PT Plan  Continue with current POC        AM-PAC PT 6 Clicks Mobility   Outcome Measure  Help needed turning from your back to your side while in a flat bed without using bedrails?: A Little Help needed moving from lying on your back to sitting on the side of a flat bed without using bedrails?: A Lot Help needed moving to and from a bed to a chair (including a wheelchair)?: Total Help needed standing up from a chair using your arms (e.g., wheelchair or bedside chair)?: Total Help needed to walk in hospital room?: Total Help needed climbing 3-5 steps with a railing? : Total 6 Click Score: 9    End of Session Equipment Utilized During Treatment: Gait belt Activity Tolerance: Patient tolerated treatment well Patient left: in bed;with call bell/phone within reach;with bed alarm set Nurse Communication: Need for lift equipment;Mobility status PT Visit Diagnosis: Muscle weakness (generalized) (M62.81);Difficulty in walking, not elsewhere classified (R26.2);Pain Pain - Right/Left: Left Pain - part of body: Hand     Time: 8389-8373 PT Time Calculation (min) (ACUTE ONLY): 16 min  Charges:    $Therapeutic Activity: 8-22 mins PT General Charges $$ ACUTE PT VISIT: 1 Visit                    [name] [name], DPT, CLT  Acute Rehabilitation Services Office: [phone] (Secure chat preferred)    [name] VEAR [name] 11/28/2023, 5:31 PM

## 2023-11-28 NOTE — Progress Notes (Signed)
 PROGRESS NOTE  [name] HAWKER Sr.  DOB: Apr 12, 1961  PCP: Freddrick Johns FMW:[id number]  DOA: 11/08/2023  LOS: 20 days  Hospital Day: 21  Brief narrative: [name] [name] is a 63 y.o. male with PMH significant for ESRD-HD-MWF, DM2, HTN, nonischemic cardiomyopathy, mild systolic CHF, prior right BKA 87/77, patient was brought to the ED after experiencing an episode of chest pain, hypotension and near syncope during hemodialysis.  His initial blood pressure was low in 75/59 On further workup in the ED, troponin was significantly elevated and peaked at 9000 He was diagnosed with NSTEMI, started on heparin  drip. Admitted to [facility]  Seen by cardiology  See below for details  Hospital course was also complicated altered mental status.  Found to have influenza A positive.  Mental status gradually improved Currently followed by GI and cardiology  Subjective: Patient was seen and examined this morning.   Lying down in bed.  Alert, awake, able to have a conversation.   Per nursing staff, patient had 2 loose bowel movements with blood in last 24 hours.  It seems diarrhea volume is improving.  But still with blood.  Assessment and plan: NSTEMI Presented with chest pain, hypertension, syncope, significantly elevated troponin  12/23 echocardiogram showed EF low at 30 to 35% with regional WMA, moderate to severe aortic stenosis Initially started on medical management with IV heparin  drip, aspirin  and Lipitor Cardiology wanted to hold off on further invasive study until mental status improved. Cardiology was reconsulted on 1/7.  Plavix  was added on 1/9.  However patient started having large-volume bloody diarrhea and hence DAPT was stopped. Since patient has shown that he will not be able to tolerate DAPT, cardiology is not planning for cardiac cath/intervention.  Medical management without DAPT planned Probably course reviewed.  Acute GI bleeding  Norovirus diarrhea GI pathogen panel positive for  norovirus.  C. difficile negative.   Patient had severe diarrhea with blood especially after initiation of Plavix . In the last 24 hours, frequency of diarrhea improving but still with blood.   GI following.  Per GI, likely etiology of bloody diarrhea is ischemic colitis in the setting of severe hypotension.   Per GI, no plan of endoscopy evaluation at this time. Aspirin  and Plavix  have been stopped Continue diet as tolerated Recent Labs    12/17/22 1244 12/17/22 1245 10/22/23 2343 11/16/23 1518 11/17/23 0245 11/23/23 0840 11/24/23 1306 11/25/23 1108 11/27/23 0435 11/27/23 1319 11/27/23 1924  HGB  --   --    < >  --    < > 14.2  --  14.0 12.0* 11.4* 11.6*  MCV  --   --    < >  --    < > 89.0  --  87.3  --   --   --   VITAMINB12  --   --   --  2,058*  --   --   --   --   --   --   --   FOLATE  --   --   --  16.6  --   --   --   --   --   --   --   FERRITIN 1,481*  --   --   --   --   --  421*  --   --   --   --   TIBC  --  190*  --   --   --   --  294  --   --   --   --  IRON   --  72  --   --   --   --  46  --   --   --   --    < > = values in this interval not displayed.   Acute exacerbation of combined systolic and diastolic CHF Moderate to severe aortic stenosis Echo from 10/1811/23 compared.  Noted a drop in EF from 45-50% to 30-35%, along with grade 3 diastolic dysfunction as well as moderate to severe aortic valve stenosis. Cardiology following Currently on metoprolol  12.5 mg twice daily and midodrine  10 mg 3 times daily.   Also on dialysis for fluid management  ESRD-HD-MWF Nephrology on board  Influenza A positive Detected on 12/30.  Completed the course of Tamiflu .  Acute metabolic encephalopathy Patient hospital stay was complicated by altered mental status.  There was initial concern for encephalitis vs meningitis. Neurology was consulted.  Patient while started empirically on broad-spectrum antibiotics, antiviral. 12/27 and 1/1, EEG did not show any evidence of  seizure activity. 1/3, MRI brain did not any acute abnormality. 1/3, lumbar puncture done. CSF labs significant for elevated glucose and protein, not consistent with meningitis; Meningitis/encephalitis panel negative for pathogen. Antibiotics were stopped. Neurology recommendation for continued supportive care with anticipation that patient will have a prolonged recovery of encephalopathy related to hyperglycemia.  ID and neurology signed off. Mental status gradually improving  Type 2 diabetes mellitus uncontrolled A1c more than 15.51 10/24/2023 Currently on Semglee  25 units daily SSI/Accu-Cheks.  Blood sugar level mostly less than 200. Continue to monitor Recent Labs  Lab 11/27/23 1133 11/27/23 1743 11/27/23 2129 11/28/23 0613 11/28/23 1208  GLUCAP 207* 178* 148* 121* 179*   H/o CVA Continue ASA, and Lipitor   Mobility: PT eval obtained.  SNF recommended  Goals of care   Code Status: Full Code     DVT prophylaxis:  Place and maintain sequential compression device Start: 11/20/23 0706 SCDs Start: 11/09/23 1558   Antimicrobials: Completed a course Fluid: None Consultants: GI, cardiology, nephrology Family Communication: Family not at bedside  Status: Inpatient Level of care:  Progressive   Patient is from: Home Needs to continue in-hospital care: Ongoing diarrhea and blood in the stool. Anticipated d/c to: SNF recommended by PT  Diet:  Diet Order             DIET SOFT Fluid consistency: Thin; Fluid restriction: 2000 mL Fluid  Diet effective now                   Scheduled Meds:  atorvastatin   40 mg Oral Daily   Chlorhexidine  Gluconate Cloth  6 each Topical Q0600   Chlorhexidine  Gluconate Cloth  6 each Topical Q0600   cinacalcet   30 mg Oral Q breakfast   doxercalciferol   3 mcg Intravenous Q M,W,F-HD   feeding supplement  237 mL Oral BID BM   heparin   6,000 Units Dialysis Once in dialysis   insulin  aspart  0-5 Units Subcutaneous QHS   insulin  aspart   0-9 Units Subcutaneous TID WC   insulin  glargine-yfgn  25 Units Subcutaneous Daily   midodrine   10 mg Oral TID WC   multivitamin  1 tablet Oral QHS   pantoprazole  (PROTONIX ) IV  40 mg Intravenous BID   sevelamer  carbonate  4,000 mg Oral TID WC   thiamine   100 mg Oral Daily    PRN meds: acetaminophen  **OR** acetaminophen , alteplase , anticoagulant sodium citrate , feeding supplement (NEPRO CARB STEADY), heparin , heparin , lidocaine  (PF), lidocaine -prilocaine , ondansetron  **OR** ondansetron  (ZOFRAN )  IV, mouth rinse, mouth rinse, mouth rinse, pentafluoroprop-tetrafluoroeth, sevelamer  carbonate   Infusions:   sodium chloride      anticoagulant sodium citrate       Antimicrobials: Anti-infectives (From admission, onward)    Start     Dose/Rate Route Frequency Ordered Stop   11/18/23 1800  oseltamivir  (TAMIFLU ) capsule 30 mg        30 mg Oral Every M-W-F (Hemodialysis) 11/18/23 1116 11/23/23 1159   11/17/23 1800  oseltamivir  (TAMIFLU ) capsule 30 mg  Status:  Discontinued        30 mg Oral Every T-Th-Sa (1800) 11/17/23 0800 11/18/23 1116   11/17/23 1115  vancomycin  (VANCOCIN ) IVPB 1000 mg/200 mL premix  Status:  Discontinued        1,000 mg 200 mL/hr over 60 Minutes Intravenous Once in dialysis 11/17/23 1021 11/18/23 1331   11/17/23 1000  oseltamivir  (TAMIFLU ) capsule 30 mg        30 mg Oral  Once 11/17/23 0759 11/17/23 0952   11/16/23 1200  vancomycin  (VANCOCIN ) IVPB 1000 mg/200 mL premix  Status:  Discontinued        1,000 mg 200 mL/hr over 60 Minutes Intravenous Every M-W-F (Hemodialysis) 11/15/23 1658 11/18/23 1306   11/12/23 2300  vancomycin  (VANCOCIN ) IVPB 1000 mg/200 mL premix        1,000 mg 200 mL/hr over 60 Minutes Intravenous  Once 11/12/23 2226 11/13/23 0131   11/12/23 2000  cefTRIAXone  (ROCEPHIN ) 2 g in sodium chloride  0.9 % 100 mL IVPB  Status:  Discontinued        2 g 200 mL/hr over 30 Minutes Intravenous Every 12 hours 11/12/23 1627 11/18/23 1306   11/12/23 2000  ampicillin   (OMNIPEN) 2 g in sodium chloride  0.9 % 100 mL IVPB  Status:  Discontinued        2 g 300 mL/hr over 20 Minutes Intravenous Every 12 hours 11/12/23 1627 11/18/23 1306   11/12/23 1730  acyclovir  (ZOVIRAX ) 450 mg in dextrose  5 % 100 mL IVPB  Status:  Discontinued        5 mg/kg  89.6 kg (Adjusted) 109 mL/hr over 60 Minutes Intravenous Every 24 hours 11/12/23 1627 11/18/23 1306   11/11/23 1915  piperacillin -tazobactam (ZOSYN ) IVPB 2.25 g  Status:  Discontinued        2.25 g 100 mL/hr over 30 Minutes Intravenous Every 8 hours 11/11/23 1825 11/12/23 1615   11/11/23 1245  metroNIDAZOLE  (FLAGYL ) IVPB 500 mg  Status:  Discontinued        500 mg 100 mL/hr over 60 Minutes Intravenous Every 12 hours 11/11/23 1146 11/11/23 1825   11/11/23 1200  vancomycin  (VANCOCIN ) IVPB 1000 mg/200 mL premix  Status:  Discontinued        1,000 mg 200 mL/hr over 60 Minutes Intravenous Every M-W-F (Hemodialysis) 11/10/23 1458 11/15/23 1658   11/10/23 1545  vancomycin  (VANCOCIN ) IVPB 1000 mg/200 mL premix       Placed in Followed by Linked Group   1,000 mg 200 mL/hr over 60 Minutes Intravenous  Once 11/10/23 1458 11/10/23 1917   11/10/23 1545  vancomycin  (VANCOCIN ) IVPB 1000 mg/200 mL premix       Placed in Followed by Linked Group   1,000 mg 200 mL/hr over 60 Minutes Intravenous  Once 11/10/23 1458 11/10/23 1815   11/10/23 1545  ceFEPIme  (MAXIPIME ) 1 g in sodium chloride  0.9 % 100 mL IVPB  Status:  Discontinued        1 g 200 mL/hr over 30 Minutes Intravenous Every  M-W-F 11/10/23 1458 11/10/23 1520   11/10/23 1545  ceFEPIme  (MAXIPIME ) 2 g in sodium chloride  0.9 % 100 mL IVPB  Status:  Discontinued        2 g 200 mL/hr over 30 Minutes Intravenous Every M-W-F 11/10/23 1520 11/11/23 1810       Objective: Vitals:   11/28/23 0407 11/28/23 0805  BP: (!) 100/55 113/63  Pulse: 80 83  Resp: 15 17  Temp: 97.8 F (36.6 C) 98.1 F (36.7 C)  SpO2: 97% 97%    Intake/Output Summary (Last 24 hours) at 11/28/2023  1436 Last data filed at 11/28/2023 1200 Gross per 24 hour  Intake 600 ml  Output 0 ml  Net 600 ml   Filed Weights   11/27/23 0339 11/27/23 1720 11/28/23 0407  Weight: 96.7 kg 96.7 kg 100.7 kg   Weight change: 0 kg Body mass index is 29.29 kg/m.   Physical Exam: General exam: Pleasant, middle-aged African-American male.  Not in distress.  Skin: No rashes, lesions or ulcers. HEENT: Atraumatic, normocephalic, no obvious bleeding Lungs: Clear to auscultation bilaterally CVS: S1, S2, no murmur,   GI/Abd: Soft, nontender, nondistended, bowel sound present,   CNS: Alert, awake, oriented to place, person and time.   Psychiatry: Mood appropriate,  Extremities: No pedal edema, no calf tenderness,   Data Review: I have personally reviewed the laboratory data and studies available.  F/u labs  Unresulted Labs (From admission, onward)     Start     Ordered   11/19/23 1134  Oligoclonal bands, CSF + serum  (Oligoclonal Bands, CSF + Serum panel)  Once,   R        11/19/23 1134   11/19/23 1134  Draw extra clot tube  (Oligoclonal Bands, CSF + Serum panel)  Once,   R       Question:  Specimen collection method  Answer:  Lab=Lab collect   11/19/23 1134   11/19/23 1134  Draw extra clot tube  (IgG CSF Index, CSF + Serum panel)  Once,   R       Question:  Specimen collection method  Answer:  Lab=Lab collect   11/19/23 1134   11/19/23 1134  Miscellaneous [facility] test (send-out)  Once,   R       Comments: Autoimmune Encephalitis Ab panel - CSF   Question Answer Comment  Test name / description: Autoimmune Encephalitis Ab panel - CSF   Release to patient Immediate      11/19/23 1134   11/13/23 0618  CBC  Daily,   R     Question:  Specimen collection method  Answer:  Lab=Lab collect   11/13/23 0617   Unscheduled  Occult blood card to lab, stool  As needed,   R      11/19/23 1729            Total time spent in review of labs and imaging, patient evaluation, formulation of plan,  documentation and communication with family: 45 minutes  Signed, Chapman Rota, MD [facility] 11/28/2023

## 2023-11-28 NOTE — Plan of Care (Signed)
   Problem: Education: Goal: Knowledge of General Education information will improve Description: Including pain rating scale, medication(s)/side effects and non-pharmacologic comfort measures Outcome: Progressing   Problem: Clinical Measurements: Goal: Respiratory complications will improve Outcome: Progressing   Problem: Activity: Goal: Risk for activity intolerance will decrease Outcome: Progressing   Problem: Pain Management: Goal: General experience of comfort will improve Outcome: Progressing

## 2023-11-28 NOTE — Plan of Care (Signed)
  Problem: Clinical Measurements: Goal: Cardiovascular complication will be avoided Outcome: Progressing   Problem: Activity: Goal: Risk for activity intolerance will decrease Outcome: Progressing   Problem: Elimination: Goal: Will not experience complications related to bowel motility Outcome: Progressing   Problem: Skin Integrity: Goal: Risk for impaired skin integrity will decrease Outcome: Progressing   Problem: Fluid Volume: Goal: Ability to maintain a balanced intake and output will improve Outcome: Progressing   Problem: Metabolic: Goal: Ability to maintain appropriate glucose levels will improve Outcome: Progressing   Problem: Bowel/Gastric: Goal: Will show no signs and symptoms of gastrointestinal bleeding Outcome: Progressing   Problem: Fluid Volume: Goal: Will show no signs and symptoms of excessive bleeding Outcome: Progressing   Problem: Clinical Measurements: Goal: Complications related to the disease process, condition or treatment will be avoided or minimized Outcome: Progressing

## 2023-11-28 NOTE — Plan of Care (Signed)
 Pt progressing on all care plans. Pt continues to have multiple bloody stools. Pain well controlled this shift. Plan of care discussed with patient; all questions and concerns addressed.

## 2023-11-28 NOTE — Progress Notes (Addendum)
 Progress Note   LOS: 20 days   Chief Complaint: GI bleed   Subjective   Patient somnolent at the time of my evaluation.  Reported no further bowel movements with me but inform Dr. Judy Blinks that he did have a small bloody bowel movement this morning.  Denies abdominal pain.  Lethargic and still somewhat altered.    Objective   Vital signs in last 24 hours: Temp:  [97.6 F (36.4 C)-98.2 F (36.8 C)] 98.1 F (36.7 C) (01/11 0805) Pulse Rate:  [66-91] 83 (01/11 0805) Resp:  [14-20] 17 (01/11 0805) BP: (100-132)/(47-82) 113/63 (01/11 0805) SpO2:  [95 %-100 %] 97 % (01/11 0805) Weight:  [96.7 kg-100.7 kg] 100.7 kg (01/11 0407) Last BM Date : 11/28/23 Last BM recorded by nurses in past 5 days Stool Type: Type 6 (Mushy consistency with ragged edges) (11/28/2023  1:00 AM)  General:   male in no acute distress  Heart:  Regular rate and rhythm; no murmurs Pulm: Clear anteriorly; no wheezing Abdomen: soft, nondistended, normal bowel sounds in all quadrants. Nontender without guarding. No organomegaly appreciated. Extremities:  No edema Neurologic:  Alert and  oriented x4;  No focal deficits.  Psych:  Cooperative. Normal mood and affect.  Intake/Output from previous day: 01/10 0701 - 01/11 0700 In: 964 [P.O.:120; Blood:844] Out: 0  Intake/Output this shift: No intake/output data recorded.  Studies/Results: No results found.  Lab Results: Recent Labs    11/27/23 0435 11/27/23 1319 11/27/23 1924  HGB 12.0* 11.4* 11.6*  HCT 36.5* 34.0* 35.0*   BMET No results for input(s): NA, K, CL, CO2, GLUCOSE, BUN, CREATININE, CALCIUM  in the last 72 hours. LFT No results for input(s): PROT, ALBUMIN , AST, ALT, ALKPHOS, BILITOT, BILIDIR, IBILI in the last 72 hours. PT/INR No results for input(s): LABPROT, INR in the last 72 hours.   Scheduled Meds:  atorvastatin   40 mg Oral Daily   Chlorhexidine  Gluconate Cloth  6 each Topical Q0600    Chlorhexidine  Gluconate Cloth  6 each Topical Q0600   cinacalcet   30 mg Oral Q breakfast   doxercalciferol   3 mcg Intravenous Q M,W,F-HD   feeding supplement  237 mL Oral BID BM   heparin   6,000 Units Dialysis Once in dialysis   insulin  aspart  0-5 Units Subcutaneous QHS   insulin  aspart  0-9 Units Subcutaneous TID WC   insulin  glargine-yfgn  25 Units Subcutaneous Daily   midodrine   10 mg Oral TID WC   multivitamin  1 tablet Oral QHS   pantoprazole  (PROTONIX ) IV  40 mg Intravenous BID   sevelamer  carbonate  4,000 mg Oral TID WC   thiamine   100 mg Oral Daily   Continuous Infusions:  sodium chloride      anticoagulant sodium citrate         Patient profile:   63 y.o. male with a past medical history significant for ESRD on HD (Monday Wednesday Friday), heart failure, type 2 diabetes, CVA, and status post right BKA as well as obesity who initially presented to the hospital on 11/08/2023 for syncope during hemodialysis. We are consulted now in regards to a GI Bleed    Impression:   1.  GI bleed: Hemoglobin 16.2 1/5 --> 11.6 today. no prior colonoscopy,  Hemoccult positive on 11/20/2023, GI pathogen panel positive for norovirus. Iron  46, saturation 16%, ferritin 421.  Possible small bloody bowel movement today.  >10 bloody bowel movements noted 1/09 likely in the setting of ischemic colitis. Appears to be improving. 2.  Acute metabolic encephalopathy: initial concern for encephalitis versus meningitis, workup so far unrevealing, neurology recommending continued supportive care with anticipation of patient will have a prolonged recovery encephalopathy related to hypoglycemia. mental status stable 3.  NSTEMI: Cardiology initially consulted, troponin elevation with a peak of 8942, echo with LVEF 30-35% and regional wall motion abnormalities, cardiology now reconsulted since mental status has improved. Cardiology notes he is high risk for EGD/Colonoscopy, but feels reasonable plan prior to  undergoing heart cath. 4.  ESRD on HD 5.  Acute on chronic diastolic heart failure 6.  Diarrhea: C. difficile negative, GI path panel being sent today 7.  Diabetes type 2 8.  History of CVA   Plan:   --  Continue daily CBC and transfuse as needed to maintain HGB > 7  -- continue to monitor for bleeding.  Etiology of bloody diarrhea likely ischemic colitis. - Continue supportive care - Continue diet as tolerated - Defer decision for endoscopic evaluation based on further bowel movements versus patient being deemed high risk for procedure from cardiology. Hgb stable reassuringly.  Bayley CHRISTELLA Blower  11/28/2023, 12:27 PM     Attending physician's note   I have taken a history, reviewed the chart and examined the patient. I performed a substantive portion of this encounter, including complete performance of at least one of the key components, in conjunction with the APP. I agree with the APP's note, impression and recommendations.    Per patient he had a small bloody movement this morning otherwise none overnight.  Hemoglobin stable Likely etiology of bloody diarrhea was ischemic colitis in the setting of acute gastroenteritis with norovirus and post hemodialysis hypotensive episode Continue supportive care Monitor hemoglobin and transfuse if below 7  Patient is high risk for anesthesia and procedure related complications will continue to monitor Continue diet as tolerated No plan for endoscopic evaluation at this point  The patient was provided an opportunity to ask questions and all were answered. The patient agreed with the plan and demonstrated an understanding of the instructions.   LOIS Wilkie Mcgee , MD [phone]

## 2023-11-28 NOTE — Progress Notes (Signed)
 [facility] Progress Note   Subjective:   Patient seen and examined at bedside.  Feeling a little better. Denies CP, SOB, abdominal pain and n/v/d.  Asked me to throw away all his full water cups and get him just a cup of ice.  Close to baseline today.   Objective Vitals:   11/27/23 2350 11/28/23 0407 11/28/23 0805 11/28/23 1211  BP: (!) 112/58 (!) 100/55 113/63   Pulse: 91 80 83   Resp: 18 15 17    Temp: 97.6 F (36.4 C) 97.8 F (36.6 C) 98.1 F (36.7 C)   TempSrc: Axillary Oral Oral Oral  SpO2: 100% 97% 97%   Weight:  100.7 kg    Height:       Physical Exam General:chronically ill appearing male in NAD Heart:RRR, +3/6 murmur Lungs:CTAB, nml WOB on RA Abdomen:soft, NTND  Extremities:no LE edema, R BKA Dialysis Access: LU AVF +b/t   Filed Weights   11/27/23 0339 11/27/23 1720 11/28/23 0407  Weight: 96.7 kg 96.7 kg 100.7 kg    Intake/Output Summary (Last 24 hours) at 11/28/2023 1329 Last data filed at 11/28/2023 1200 Gross per 24 hour  Intake 600 ml  Output 0 ml  Net 600 ml    Additional Objective Labs: Basic Metabolic Panel: Recent Labs  Lab 11/22/23 1044 11/23/23 0840 11/25/23 0257  NA 135 133* 131*  K 4.4 4.6 4.9  CL 92* 89* 87*  CO2 21* 21* 27  GLUCOSE 200* 276* 265*  BUN 61* 78* 63*  CREATININE 11.72* 13.17* 11.32*  CALCIUM  10.4* 10.1 9.9  PHOS 7.2* 8.0*  --    Liver Function Tests: Recent Labs  Lab 11/22/23 1044 11/23/23 0840  ALBUMIN  3.3* 3.1*   CBC: Recent Labs  Lab 11/22/23 1044 11/23/23 0840 11/25/23 1108 11/27/23 0435 11/27/23 1319 11/27/23 1924  WBC 9.5 9.0 8.9  --   --   --   HGB 16.2 14.2 14.0 12.0* 11.4* 11.6*  HCT 49.9 43.7 42.7 36.5* 34.0* 35.0*  MCV 88.9 89.0 87.3  --   --   --   PLT 367 400 387  --   --   --    Blood Culture    Component Value Date/Time   SDES CSF 11/19/2023 1134   SPECREQUEST NONE 11/19/2023 1134   CULT  11/19/2023 1134    NO GROWTH 3 DAYS Performed at [facility],  [address] [place], [place] [address]    REPTSTATUS 11/23/2023 FINAL 11/19/2023 1134    Medications:  sodium chloride      anticoagulant sodium citrate       atorvastatin   40 mg Oral Daily   Chlorhexidine  Gluconate Cloth  6 each Topical Q0600   Chlorhexidine  Gluconate Cloth  6 each Topical Q0600   cinacalcet   30 mg Oral Q breakfast   doxercalciferol   3 mcg Intravenous Q M,W,F-HD   feeding supplement  237 mL Oral BID BM   heparin   6,000 Units Dialysis Once in dialysis   insulin  aspart  0-5 Units Subcutaneous QHS   insulin  aspart  0-9 Units Subcutaneous TID WC   insulin  glargine-yfgn  25 Units Subcutaneous Daily   midodrine   10 mg Oral TID WC   multivitamin  1 tablet Oral QHS   pantoprazole  (PROTONIX ) IV  40 mg Intravenous BID   sevelamer  carbonate  4,000 mg Oral TID WC   thiamine   100 mg Oral Daily    Dialysis Orders: [facility] MWF 4h  450/700  105kg  2/2 bath   AVF   Heparin  7000 + 2000 midrun - Hectorol  7 mcg IV three times per week   Assessment/Plan: 1.AMS: Was on cefepime  which has been stopped. Per primary team some concern for encephalitis/meningitis, was on empiric antibiotics/antivirals until seen by ID on 11/18/23 and all was stopped.  S/P LP which ruled out bacterail meningitis/encephalitis. He did test + for flu A by resp panel. MRI without acute abnormality.  ID stopped abx and antivirals and signed off. Getting trial of high-dose thiamine . Mental status has sig improved - appears close to baseline today. 2.  ESRD - usual HD is MWF. Next HD 11/29/22. 3. NSTEMI- work up per cardiology/primary. No plan for invasive procedure and not DAPT candidate per cardiology.  Cardiology signed off today.  4. Volume - under dry wt by 8kg yesterday. Did not tolerate UF last treatment.  Will likely need lower dry weight on d/c.  5. Fever/Hypotension- BC with NGTD. Was started on board spectrum antibiotics/antivirals but now discontinued.  ID signed off. 6.  HFrEF/history of severe AS- attempting to optimize volume with dialysis. ECHO  EF 30 to 35%. Seen by Cards - medical management of AS. Fluid restrictions and daily weights. On midodrine  10 mg 3 times daily.  7. Anemia  - Hgb 11.6, no ESA indicated at this time.  8. Metabolic bone disease -Phos at goal. Continue renvela . VDRA on hold d/t high calcium ; coming down. 9. Debility - memory is better however he remains debilitated. PT is working w/ him, they don't think he can tolerate HD in a chair just yet. 10. GIB/Norovirus - C diff neg. GI following. Bloody BM reported.  1 unit pRBC given yesterday.   Manuelita Labella, PA-C [facility] 11/28/2023,1:29 PM  LOS: 20 days

## 2023-11-28 NOTE — Progress Notes (Signed)
 Progress Note  Patient Name: [name] STUTSMAN Sr. Date of Encounter: 11/28/2023  Primary Cardiologist:   Jayson Sierras, MD   Subjective   No chest pain.  No SOB .    Inpatient Medications    Scheduled Meds:  atorvastatin   40 mg Oral Daily   Chlorhexidine  Gluconate Cloth  6 each Topical Q0600   Chlorhexidine  Gluconate Cloth  6 each Topical Q0600   cinacalcet   30 mg Oral Q breakfast   doxercalciferol   3 mcg Intravenous Q M,W,F-HD   feeding supplement  237 mL Oral BID BM   heparin   6,000 Units Dialysis Once in dialysis   insulin  aspart  0-5 Units Subcutaneous QHS   insulin  aspart  0-9 Units Subcutaneous TID WC   insulin  glargine-yfgn  25 Units Subcutaneous Daily   midodrine   10 mg Oral TID WC   multivitamin  1 tablet Oral QHS   pantoprazole  (PROTONIX ) IV  40 mg Intravenous BID   sevelamer  carbonate  4,000 mg Oral TID WC   thiamine   100 mg Oral Daily   Continuous Infusions:  sodium chloride      anticoagulant sodium citrate      PRN Meds: acetaminophen  **OR** acetaminophen , alteplase , anticoagulant sodium citrate , feeding supplement (NEPRO CARB STEADY), heparin , heparin , lidocaine  (PF), lidocaine -prilocaine , ondansetron  **OR** ondansetron  (ZOFRAN ) IV, mouth rinse, mouth rinse, mouth rinse, pentafluoroprop-tetrafluoroeth, sevelamer  carbonate   Vital Signs    Vitals:   11/27/23 1748 11/27/23 2012 11/27/23 2350 11/28/23 0407  BP: 131/66 (!) 108/59 (!) 112/58 (!) 100/55  Pulse: 86 88 91 80  Resp: 16 20 18 15   Temp: 97.8 F (36.6 C) 98.2 F (36.8 C) 97.6 F (36.4 C) 97.8 F (36.6 C)  TempSrc: Oral Oral Axillary Oral  SpO2: 95% 97% 100% 97%  Weight:    100.7 kg  Height:        Intake/Output Summary (Last 24 hours) at 11/28/2023 0806 Last data filed at 11/27/2023 2353 Gross per 24 hour  Intake 964 ml  Output 0 ml  Net 964 ml   Filed Weights   11/27/23 0339 11/27/23 1720 11/28/23 0407  Weight: 96.7 kg 96.7 kg 100.7 kg    Telemetry    NSR - Personally  Reviewed  ECG    NA - Personally Reviewed  Physical Exam   GEN: No acute distress.   Neck: No  JVD Cardiac: RRR, 3/6 apical systolic murmur, no diastolic murmurs, rubs, or gallops.  Respiratory: Clear  to auscultation bilaterally. GI: Soft, nontender, non-distended  MS: No  edema; No deformity. Neuro:  Nonfocal  Psych: Normal affect   Labs    Chemistry Recent Labs  Lab 11/22/23 1044 11/23/23 0840 11/25/23 0257  NA 135 133* 131*  K 4.4 4.6 4.9  CL 92* 89* 87*  CO2 21* 21* 27  GLUCOSE 200* 276* 265*  BUN 61* 78* 63*  CREATININE 11.72* 13.17* 11.32*  CALCIUM  10.4* 10.1 9.9  ALBUMIN  3.3* 3.1*  --   GFRNONAA 4* 4* 5*  ANIONGAP 22* 23* 17*     Hematology Recent Labs  Lab 11/22/23 1044 11/23/23 0840 11/25/23 1108 11/27/23 0435 11/27/23 1319 11/27/23 1924  WBC 9.5 9.0 8.9  --   --   --   RBC 5.61 4.91 4.89  --   --   --   HGB 16.2 14.2 14.0 12.0* 11.4* 11.6*  HCT 49.9 43.7 42.7 36.5* 34.0* 35.0*  MCV 88.9 89.0 87.3  --   --   --   MCH 28.9 28.9 28.6  --   --   --  MCHC 32.5 32.5 32.8  --   --   --   RDW 16.7* 16.3* 15.8*  --   --   --   PLT 367 400 387  --   --   --     Cardiac EnzymesNo results for input(s): TROPONINI in the last 168 hours. No results for input(s): TROPIPOC in the last 168 hours.   BNPNo results for input(s): BNP, PROBNP in the last 168 hours.   DDimer No results for input(s): DDIMER in the last 168 hours.   Radiology    No results found.  Cardiac Studies   Echocardiogram 11/09/2023  1. Left ventricular ejection fraction, by estimation, is 30 to 35%. The  left ventricle has moderately decreased function. The left ventricle  demonstrates regional wall motion abnormalities (see scoring  diagram/findings for description). There is  moderate concentric left ventricular hypertrophy. Left ventricular  diastolic parameters are consistent with Grade III diastolic dysfunction  (restrictive).   2. Right ventricular systolic  function is mildly reduced. The right  ventricular size is normal. There is mildly elevated pulmonary artery  systolic pressure. The estimated right ventricular systolic pressure is  42.2 mmHg.   3. Left atrial size was moderately dilated.   4. The mitral valve is degenerative. Trivial mitral valve regurgitation.  Moderate mitral annular calcification.   5. The aortic valve is tricuspid. There is moderate calcification of the  aortic valve. Aortic valve regurgitation is not visualized. Moderate to  severe aortic valve stenosis - suspect upper end of scale, low flow/low  gradient. Aortic valve area, by VTI  measures 0.98 cm. Aortic valve mean gradient measures 22.0 mmHg.  Dimentionless index 0.19.   6. Aortic dilatation noted. There is borderline dilatation of the aortic  root, measuring 40 mm.   7. The inferior vena cava is dilated in size with <50% respiratory  variability, suggesting right atrial pressure of 15 mmHg.   Patient Profile     63 y.o. male with PMH significant for ESRD-HD-MWF, DM2, HTN, nonischemic cardiomyopathy, mild systolic CHF, prior right BKA . The patient was brought to the ED after experiencing an episode of chest pain, hypotension and near syncope during hemodialysis.   Assessment & Plan    NSTEMI:  Not a DAPT candidate with GI bleeding.  There is no plan for invasive evaluation.  Continue Lipitor.    Acute on chronic combined systolic and diastolic heart failure.  EF as above.  Volume management per dialysis.  Aortic stenosis: This is moderate to severe but needs to be managed medically given multiple comorbidities and medical issues.  Cardiology will sign off again.  Please call with further questions.  For questions or updates, please contact [facility] Please consult [email or web address] for contact info under Cardiology/STEMI.   Signed, Lynwood Schilling, MD  11/28/2023, 8:06 AM

## 2023-11-29 DIAGNOSIS — I214 Non-ST elevation (NSTEMI) myocardial infarction: Secondary | ICD-10-CM | POA: Diagnosis not present

## 2023-11-29 LAB — GLUCOSE, CAPILLARY
Glucose-Capillary: 185 mg/dL — ABNORMAL HIGH (ref 70–99)
Glucose-Capillary: 157 mg/dL — ABNORMAL HIGH (ref 70–99)
Glucose-Capillary: 121 mg/dL — ABNORMAL HIGH (ref 70–99)
Glucose-Capillary: 94 mg/dL (ref 70–99)

## 2023-11-29 MED ORDER — CHLORHEXIDINE GLUCONATE CLOTH 2 % EX PADS: 6.0000 | MEDICATED_PAD | Freq: Every day | CUTANEOUS | Status: DC

## 2023-11-29 NOTE — Progress Notes (Signed)
 PROGRESS NOTE  [name] HOCHMUTH Sr.  DOB: [birth year]  PCP: Freddrick Johns FMW:[id number]  DOA: 11/08/2023  LOS: 21 days  Hospital Day: 22  Brief narrative: [name] [name] is a 63 y.o. male with PMH significant for ESRD-HD-MWF, DM2, HTN, nonischemic cardiomyopathy, mild systolic CHF, prior right BKA 87/77, patient was brought to the ED after experiencing an episode of chest pain, hypotension and near syncope during hemodialysis.  His initial blood pressure was low in 75/59 On further workup in the ED, troponin was significantly elevated and peaked at 9000 He was diagnosed with NSTEMI, started on heparin  drip. Admitted to [facility]  Seen by cardiology  See below for details  Hospital course was also complicated altered mental status.  Found to have influenza A positive.  Mental status gradually improved Currently followed by GI and cardiology  Subjective: Patient was seen and examined this morning.   Lying on bed.  Alert, awake, able to have a conversation. Per documentation, patient had multiple episodes of loose bloody bowel movement in the last 24 hours. GI to follow-up.  Assessment and plan: NSTEMI Presented with chest pain, hypertension, syncope, significantly elevated troponin  12/23 echocardiogram showed EF low at 30 to 35% with regional WMA, moderate to severe aortic stenosis Initially started on medical management with IV heparin  drip, aspirin  and Lipitor Cardiology wanted to hold off on further invasive study until mental status improved. Cardiology was reconsulted on 1/7.  Plavix  was added on 1/9.  However patient started having large-volume bloody diarrhea and hence DAPT was stopped. Since patient has shown that he will not be able to tolerate DAPT, cardiology is not planning for cardiac cath/intervention.  Medical management without DAPT planned Probably course reviewed.  Acute GI bleeding  Norovirus diarrhea GI pathogen panel positive for norovirus.  C. difficile negative.    Patient had severe diarrhea with blood especially after initiation of Plavix . Diarrhea seems to be worsening again.  In the last 24 hours, patient had multiple episodes of loose bloody bowel movement GI following.  Per GI, likely etiology of bloody diarrhea is ischemic colitis in the setting of severe hypotension.   GI to follow-up.  No plan of endoscopy evaluation at this time. Aspirin  and Plavix  have been stopped Continue diet as tolerated Recent Labs    12/17/22 1244 12/17/22 1245 10/22/23 2343 11/16/23 1518 11/17/23 0245 11/23/23 0840 11/24/23 1306 11/25/23 1108 11/27/23 0435 11/27/23 1319 11/27/23 1924  HGB  --   --    < >  --    < > 14.2  --  14.0 12.0* 11.4* 11.6*  MCV  --   --    < >  --    < > 89.0  --  87.3  --   --   --   VITAMINB12  --   --   --  2,058*  --   --   --   --   --   --   --   FOLATE  --   --   --  16.6  --   --   --   --   --   --   --   FERRITIN 1,481*  --   --   --   --   --  421*  --   --   --   --   TIBC  --  190*  --   --   --   --  294  --   --   --   --  IRON   --  72  --   --   --   --  46  --   --   --   --    < > = values in this interval not displayed.   Acute exacerbation of combined systolic and diastolic CHF Moderate to severe aortic stenosis Echo from 10/1811/23 compared.  Noted a drop in EF from 45-50% to 30-35%, along with grade 3 diastolic dysfunction as well as moderate to severe aortic valve stenosis. Cardiology following Currently on midodrine  10 mg 3 times daily.  Metoprolol  held because of low blood pressure. Also on dialysis for fluid management  ESRD-HD-MWF Nephrology on board  Influenza A positive Detected on 12/30.  Completed the course of Tamiflu .  Acute metabolic encephalopathy Patient hospital stay was complicated by altered mental status.  There was initial concern for encephalitis vs meningitis. Neurology was consulted.  Patient while started empirically on broad-spectrum antibiotics, antiviral. 12/27 and 1/1, EEG  did not show any evidence of seizure activity. 1/3, MRI brain did not any acute abnormality. 1/3, lumbar puncture done. CSF labs significant for elevated glucose and protein, not consistent with meningitis; Meningitis/encephalitis panel negative for pathogen. Antibiotics were stopped. Neurology recommendation for continued supportive care with anticipation that patient will have a prolonged recovery of encephalopathy related to hyperglycemia.  ID and neurology signed off. Mental status gradually improved  Type 2 diabetes mellitus uncontrolled A1c more than 15.51 10/24/2023 Currently on Semglee  25 units daily SSI/Accu-Cheks.  Blood sugar level mostly less than 200. Continue to monitor Recent Labs  Lab 11/28/23 0613 11/28/23 1208 11/28/23 1607 11/28/23 2029 11/29/23 0603  GLUCAP 121* 179* 160* 195* 185*   H/o CVA Continue ASA, and Lipitor   Mobility: PT eval obtained.  SNF recommended  Goals of care   Code Status: Full Code     DVT prophylaxis:  Place and maintain sequential compression device Start: 11/20/23 0706 SCDs Start: 11/09/23 1558   Antimicrobials: Completed a course Fluid: None Consultants: GI, cardiology, nephrology Family Communication: Family not at bedside  Status: Inpatient Level of care:  Progressive   Patient is from: Home Needs to continue in-hospital care: Ongoing diarrhea and blood in the stool..  GI following. Anticipated d/c to: SNF recommended by PT  Diet:  Diet Order             DIET SOFT Fluid consistency: Thin; Fluid restriction: 2000 mL Fluid  Diet effective now                   Scheduled Meds:  atorvastatin   40 mg Oral Daily   Chlorhexidine  Gluconate Cloth  6 each Topical Q0600   Chlorhexidine  Gluconate Cloth  6 each Topical Q0600   cinacalcet   30 mg Oral Q breakfast   doxercalciferol   3 mcg Intravenous Q M,W,F-HD   feeding supplement  237 mL Oral BID BM   heparin   6,000 Units Dialysis Once in dialysis   insulin  aspart  0-5  Units Subcutaneous QHS   insulin  aspart  0-9 Units Subcutaneous TID WC   insulin  glargine-yfgn  25 Units Subcutaneous Daily   midodrine   10 mg Oral TID WC   multivitamin  1 tablet Oral QHS   pantoprazole  (PROTONIX ) IV  40 mg Intravenous BID   sevelamer  carbonate  4,000 mg Oral TID WC   thiamine   100 mg Oral Daily    PRN meds: acetaminophen  **OR** acetaminophen , alteplase , anticoagulant sodium citrate , feeding supplement (NEPRO CARB STEADY), heparin , heparin , lidocaine  (PF), lidocaine -prilocaine , ondansetron  **  OR** ondansetron  (ZOFRAN ) IV, mouth rinse, mouth rinse, mouth rinse, pentafluoroprop-tetrafluoroeth, sevelamer  carbonate   Infusions:   anticoagulant sodium citrate       Antimicrobials: Anti-infectives (From admission, onward)    Start     Dose/Rate Route Frequency Ordered Stop   11/18/23 1800  oseltamivir  (TAMIFLU ) capsule 30 mg        30 mg Oral Every M-W-F (Hemodialysis) 11/18/23 1116 11/23/23 1159   11/17/23 1800  oseltamivir  (TAMIFLU ) capsule 30 mg  Status:  Discontinued        30 mg Oral Every T-Th-Sa (1800) 11/17/23 0800 11/18/23 1116   11/17/23 1115  vancomycin  (VANCOCIN ) IVPB 1000 mg/200 mL premix  Status:  Discontinued        1,000 mg 200 mL/hr over 60 Minutes Intravenous Once in dialysis 11/17/23 1021 11/18/23 1331   11/17/23 1000  oseltamivir  (TAMIFLU ) capsule 30 mg        30 mg Oral  Once 11/17/23 0759 11/17/23 0952   11/16/23 1200  vancomycin  (VANCOCIN ) IVPB 1000 mg/200 mL premix  Status:  Discontinued        1,000 mg 200 mL/hr over 60 Minutes Intravenous Every M-W-F (Hemodialysis) 11/15/23 1658 11/18/23 1306   11/12/23 2300  vancomycin  (VANCOCIN ) IVPB 1000 mg/200 mL premix        1,000 mg 200 mL/hr over 60 Minutes Intravenous  Once 11/12/23 2226 11/13/23 0131   11/12/23 2000  cefTRIAXone  (ROCEPHIN ) 2 g in sodium chloride  0.9 % 100 mL IVPB  Status:  Discontinued        2 g 200 mL/hr over 30 Minutes Intravenous Every 12 hours 11/12/23 1627 11/18/23 1306    11/12/23 2000  ampicillin  (OMNIPEN) 2 g in sodium chloride  0.9 % 100 mL IVPB  Status:  Discontinued        2 g 300 mL/hr over 20 Minutes Intravenous Every 12 hours 11/12/23 1627 11/18/23 1306   11/12/23 1730  acyclovir  (ZOVIRAX ) 450 mg in dextrose  5 % 100 mL IVPB  Status:  Discontinued        5 mg/kg  89.6 kg (Adjusted) 109 mL/hr over 60 Minutes Intravenous Every 24 hours 11/12/23 1627 11/18/23 1306   11/11/23 1915  piperacillin -tazobactam (ZOSYN ) IVPB 2.25 g  Status:  Discontinued        2.25 g 100 mL/hr over 30 Minutes Intravenous Every 8 hours 11/11/23 1825 11/12/23 1615   11/11/23 1245  metroNIDAZOLE  (FLAGYL ) IVPB 500 mg  Status:  Discontinued        500 mg 100 mL/hr over 60 Minutes Intravenous Every 12 hours 11/11/23 1146 11/11/23 1825   11/11/23 1200  vancomycin  (VANCOCIN ) IVPB 1000 mg/200 mL premix  Status:  Discontinued        1,000 mg 200 mL/hr over 60 Minutes Intravenous Every M-W-F (Hemodialysis) 11/10/23 1458 11/15/23 1658   11/10/23 1545  vancomycin  (VANCOCIN ) IVPB 1000 mg/200 mL premix       Placed in Followed by Linked Group   1,000 mg 200 mL/hr over 60 Minutes Intravenous  Once 11/10/23 1458 11/10/23 1917   11/10/23 1545  vancomycin  (VANCOCIN ) IVPB 1000 mg/200 mL premix       Placed in Followed by Linked Group   1,000 mg 200 mL/hr over 60 Minutes Intravenous  Once 11/10/23 1458 11/10/23 1815   11/10/23 1545  ceFEPIme  (MAXIPIME ) 1 g in sodium chloride  0.9 % 100 mL IVPB  Status:  Discontinued        1 g 200 mL/hr over 30 Minutes Intravenous Every M-W-F 11/10/23 1458  11/10/23 1520   11/10/23 1545  ceFEPIme  (MAXIPIME ) 2 g in sodium chloride  0.9 % 100 mL IVPB  Status:  Discontinued        2 g 200 mL/hr over 30 Minutes Intravenous Every M-W-F 11/10/23 1520 11/11/23 1810       Objective: Vitals:   11/29/23 0445 11/29/23 0837  BP: 118/62 (!) 107/52  Pulse: 80 80  Resp: 15 13  Temp: 98.4 F (36.9 C) 98.1 F (36.7 C)  SpO2: 96% 97%    Intake/Output Summary  (Last 24 hours) at 11/29/2023 1045 Last data filed at 11/29/2023 0914 Gross per 24 hour  Intake 720 ml  Output --  Net 720 ml   Filed Weights   11/27/23 1720 11/28/23 0407 11/29/23 0600  Weight: 96.7 kg 100.7 kg 101.9 kg   Weight change: 5.2 kg Body mass index is 29.64 kg/m.   Physical Exam: General exam: Pleasant, middle-aged African-American male.  Not in distress.  Skin: No rashes, lesions or ulcers. HEENT: Atraumatic, normocephalic, no obvious bleeding Lungs: Clear to auscultation bilaterally CVS: S1, S2, no murmur,   GI/Abd: Soft, nontender, nondistended, bowel sound present CNS: Alert, awake, oriented to place, person and time.   Psychiatry: Mood appropriate,  Extremities: No pedal edema, no calf tenderness,   Data Review: I have personally reviewed the laboratory data and studies available.  F/u labs  Unresulted Labs (From admission, onward)     Start     Ordered   11/30/23 0500  CBC with Differential/Platelet  Tomorrow morning,   R       Question:  Specimen collection method  Answer:  Lab=Lab collect   11/29/23 0846   11/30/23 0500  Basic metabolic panel  Tomorrow morning,   R       Question:  Specimen collection method  Answer:  Lab=Lab collect   11/29/23 0846   11/19/23 1134  Oligoclonal bands, CSF + serum  (Oligoclonal Bands, CSF + Serum panel)  Once,   R        11/19/23 1134   11/19/23 1134  Miscellaneous [facility] test (send-out)  Once,   R       Comments: Autoimmune Encephalitis Ab panel - CSF   Question Answer Comment  Test name / description: Autoimmune Encephalitis Ab panel - CSF   Release to patient Immediate      11/19/23 1134   Unscheduled  Occult blood card to lab, stool  As needed,   R      11/19/23 1729            Total time spent in review of labs and imaging, patient evaluation, formulation of plan, documentation and communication with family: 45 minutes  Signed, Chapman Rota, MD [facility] 11/29/2023

## 2023-11-29 NOTE — Progress Notes (Signed)
 [name]   Subjective:   Patient seen and examined at bedside.  Feeling better today.  Continues to have bloody BM. Denies abdominal pain, n/v/, CP, SOB and edema.   Objective Vitals:   11/29/23 0445 11/29/23 0600 11/29/23 0837 11/29/23 1117  BP: 118/62  (!) 107/52 (!) 99/56  Pulse: 80  80 82  Resp: 15  13 15   Temp: 98.4 F (36.9 C)  98.1 F (36.7 C) 98.4 F (36.9 C)  TempSrc: Oral  Oral Oral  SpO2: 96%  97% 96%  Weight:  101.9 kg    Height:       Physical Exam General:chronically ill appearing male in NAD Heart:RRR,+3/6 systolic murmur Lungs:CTAB, nml WOB on RA Abdomen:soft, NTND Extremities:no LE edema, R BKA Dialysis Access: LU AVF +b/t   Filed Weights   11/27/23 1720 11/28/23 0407 11/29/23 0600  Weight: 96.7 kg 100.7 kg 101.9 kg    Intake/Output Summary (Last 24 hours) at 11/29/2023 1431 Last data filed at 11/29/2023 0914 Gross per 24 hour  Intake 480 ml  Output --  Net 480 ml    Additional Objective Labs: Basic Metabolic Panel: Recent Labs  Lab 11/23/23 0840 11/25/23 0257  NA 133* 131*  K 4.6 4.9  CL 89* 87*  CO2 21* 27  GLUCOSE 276* 265*  BUN 78* 63*  CREATININE 13.17* 11.32*  CALCIUM  10.1 9.9  PHOS 8.0*  --    Liver Function Tests: Recent Labs  Lab 11/23/23 0840  ALBUMIN  3.1*    CBC: Recent Labs  Lab 11/23/23 0840 11/25/23 1108 11/27/23 0435 11/27/23 1319 11/27/23 1924  WBC 9.0 8.9  --   --   --   HGB 14.2 14.0 12.0* 11.4* 11.6*  HCT 43.7 42.7 36.5* 34.0* 35.0*  MCV 89.0 87.3  --   --   --   PLT 400 387  --   --   --    Blood Culture    Component Value Date/Time   SDES CSF 11/19/2023 1134   SPECREQUEST NONE 11/19/2023 1134   CULT  11/19/2023 1134    NO GROWTH 3 DAYS Performed at [facility], [address] [place], [place] [address]    REPTSTATUS 11/23/2023 FINAL 11/19/2023 1134    CBG: Recent Labs  Lab 11/28/23 1208 11/28/23 1607 11/28/23 2029 11/29/23 0603 11/29/23 1115   GLUCAP 179* 160* 195* 185* 157*    Medications:  anticoagulant sodium citrate       atorvastatin   40 mg Oral Daily   Chlorhexidine  Gluconate Cloth  6 each Topical Q0600   Chlorhexidine  Gluconate Cloth  6 each Topical Q0600   cinacalcet   30 mg Oral Q breakfast   doxercalciferol   3 mcg Intravenous Q M,W,F-HD   feeding supplement  237 mL Oral BID BM   heparin   6,000 Units Dialysis Once in dialysis   insulin  aspart  0-5 Units Subcutaneous QHS   insulin  aspart  0-9 Units Subcutaneous TID WC   insulin  glargine-yfgn  25 Units Subcutaneous Daily   midodrine   10 mg Oral TID WC   multivitamin  1 tablet Oral QHS   pantoprazole  (PROTONIX ) IV  40 mg Intravenous BID   sevelamer  carbonate  4,000 mg Oral TID WC   thiamine   100 mg Oral Daily    Dialysis Orders: [facility] MWF 4h  450/700  105kg   2/2 bath   AVF   Heparin  7000 + 2000 midrun - Hectorol  7 mcg IV three times per week  Assessment/Plan: 1.AMS: Was on cefepime  which has been stopped. Per primary team some concern for encephalitis/meningitis, was on empiric antibiotics/antivirals until seen by ID on 11/18/23 and all was stopped.  S/P LP which ruled out bacterail meningitis/encephalitis. He did test + for flu A by resp panel. MRI without acute abnormality.  ID stopped abx and antivirals and signed off. Getting trial of high-dose thiamine . Mental status has sig improved - appears close to baseline today. 2.  ESRD - usual HD is MWF. Next HD 11/29/22. 3. NSTEMI- work up per cardiology/primary. No plan for invasive procedure and not DAPT candidate per cardiology.  Cardiology signed off today.  4. Volume - under dry wt by 8kg yesterday. Did not tolerate UF last treatment.  Will likely need lower dry weight on d/c. UF as tolerated.  5. Fever/Hypotension- BC with NGTD. Was started on board spectrum antibiotics/antivirals but now discontinued.  ID signed off. 6. HFrEF/history of severe AS- attempting to optimize volume with  dialysis. ECHO  EF 30 to 35%. Seen by Cards - medical management of AS. Fluid restrictions and daily weights. On midodrine  10 mg 3 times daily.  7. Anemia  - Hgb 11.6, no ESA indicated at this time.  8. Metabolic bone disease -Phos at goal. Continue renvela . VDRA on hold d/t high calcium ; coming down. 9. Debility - memory is better however he remains debilitated. PT is working w/ him, they don't think he can tolerate HD in a chair just yet. 10. GIB/Norovirus - C diff neg. GI following. Bloody BM reported.  1 unit pRBC given 11/26/22.    [name] Labella, PA-C [facility] 11/29/2023,2:31 PM  LOS: 21 days

## 2023-11-30 ENCOUNTER — Inpatient Hospital Stay (HOSPITAL_COMMUNITY): Payer: Medicare Other

## 2023-11-30 DIAGNOSIS — R197 Diarrhea, unspecified: Secondary | ICD-10-CM | POA: Diagnosis not present

## 2023-11-30 DIAGNOSIS — K922 Gastrointestinal hemorrhage, unspecified: Secondary | ICD-10-CM | POA: Diagnosis not present

## 2023-11-30 DIAGNOSIS — I214 Non-ST elevation (NSTEMI) myocardial infarction: Secondary | ICD-10-CM | POA: Diagnosis not present

## 2023-11-30 DIAGNOSIS — G9341 Metabolic encephalopathy: Secondary | ICD-10-CM | POA: Diagnosis not present

## 2023-11-30 LAB — CBC WITH DIFFERENTIAL/PLATELET
Abs Immature Granulocytes: 0.03 10*3/uL (ref 0.00–0.07)
Basophils Absolute: 0.1 10*3/uL (ref 0.0–0.1)
Basophils Relative: 2 %
Eosinophils Absolute: 0.4 10*3/uL (ref 0.0–0.5)
Eosinophils Relative: 5 %
HCT: 28.9 % — ABNORMAL LOW (ref 39.0–52.0)
Hemoglobin: 9.4 g/dL — ABNORMAL LOW (ref 13.0–17.0)
Immature Granulocytes: 0 %
Lymphocytes Relative: 20 %
Lymphs Abs: 1.8 10*3/uL (ref 0.7–4.0)
MCH: 28.6 pg (ref 26.0–34.0)
MCHC: 32.5 g/dL (ref 30.0–36.0)
MCV: 87.8 fL (ref 80.0–100.0)
Monocytes Absolute: 1.7 10*3/uL — ABNORMAL HIGH (ref 0.1–1.0)
Monocytes Relative: 19 %
Neutro Abs: 4.9 10*3/uL (ref 1.7–7.7)
Neutrophils Relative %: 54 %
Platelets: 275 10*3/uL (ref 150–400)
RBC: 3.29 MIL/uL — ABNORMAL LOW (ref 4.22–5.81)
RDW: 15.5 % (ref 11.5–15.5)
WBC: 8.9 10*3/uL (ref 4.0–10.5)
nRBC: 0 % (ref 0.0–0.2)

## 2023-11-30 LAB — GLUCOSE, CAPILLARY
Glucose-Capillary: 88 mg/dL (ref 70–99)
Glucose-Capillary: 87 mg/dL (ref 70–99)
Glucose-Capillary: 154 mg/dL — ABNORMAL HIGH (ref 70–99)
Glucose-Capillary: 191 mg/dL — ABNORMAL HIGH (ref 70–99)

## 2023-11-30 LAB — IRON AND TIBC
Iron: 30 ug/dL — ABNORMAL LOW (ref 45–182)
Saturation Ratios: 9 % — ABNORMAL LOW (ref 17.9–39.5)
TIBC: 322 ug/dL (ref 250–450)
UIBC: 292 ug/dL

## 2023-11-30 LAB — BASIC METABOLIC PANEL
Anion gap: 12 (ref 5–15)
BUN: 56 mg/dL — ABNORMAL HIGH (ref 8–23)
CO2: 24 mmol/L (ref 22–32)
Calcium: 9.2 mg/dL (ref 8.9–10.3)
Chloride: 94 mmol/L — ABNORMAL LOW (ref 98–111)
Creatinine, Ser: 11.83 mg/dL — ABNORMAL HIGH (ref 0.61–1.24)
GFR, Estimated: 4 mL/min — ABNORMAL LOW (ref >60)
Glucose, Bld: 88 mg/dL (ref 70–99)
Potassium: 5.6 mmol/L — ABNORMAL HIGH (ref 3.5–5.1)
Sodium: 130 mmol/L — ABNORMAL LOW (ref 135–145)

## 2023-11-30 LAB — FERRITIN: Ferritin: 342 ng/mL — ABNORMAL HIGH (ref 24–336)

## 2023-11-30 MED ORDER — IOHEXOL 350 MG/ML SOLN
100.0000 mL | Freq: Once | INTRAVENOUS | Status: AC | PRN
  Administered 2023-11-30: 100 mL via INTRAVENOUS

## 2023-11-30 MED ORDER — IOHEXOL 350 MG/ML SOLN: 100.0000 mL | Freq: Once | INTRAVENOUS | Status: DC | PRN

## 2023-11-30 MED ORDER — DARBEPOETIN ALFA 100 MCG/0.5ML IJ SOSY
100.0000 ug | PREFILLED_SYRINGE | INTRAMUSCULAR | Status: DC
  Administered 2023-11-30 – 2023-12-14 (×3): 100 ug via SUBCUTANEOUS
  Filled 2023-11-30 (×3): qty 0.5

## 2023-11-30 NOTE — Progress Notes (Signed)
 Progress Note   LOS: 22 days   Chief Complaint: GI bleed   Subjective   Patient states that he has continued to have black and red stools.  He has not had this since admission.  Denies significant abdominal pain.  Patient is currently getting hemodialysis.    Objective   Vital signs in last 24 hours: Temp:  [98 F (36.7 C)-98.4 F (36.9 C)] 98.2 F (36.8 C) (01/13 0758) Pulse Rate:  [78-82] 82 (01/13 0627) Resp:  [14-20] 20 (01/13 0627) BP: (96-119)/(48-68) 107/60 (01/13 1213) SpO2:  [94 %-98 %] 96 % (01/13 0627) Weight:  [102.7 kg-103 kg] 103 kg (01/13 0810) Last BM Date : 11/29/23 Last BM recorded by nurses in past 5 days Stool Type: Type 5 (Soft blobs with clear-cut edges) (11/29/2023 11:09 AM)  General:   male in no acute distress  Heart:  Regular rate and rhythm; no murmurs Pulm: Clear anteriorly; no wheezing Abdomen: soft, nondistended, normal bowel sounds in all quadrants. Nontender without guarding. No organomegaly appreciated. Extremities:  No edema Neurologic:  Alert and  oriented x4;  No focal deficits.  Psych:  Cooperative. Normal mood and affect.  Intake/Output from previous day: 01/12 0701 - 01/13 0700 In: 360 [P.O.:360] Out: -  Intake/Output this shift: No intake/output data recorded.  Studies/Results: No results found.  Lab Results: Recent Labs    11/27/23 1319 11/27/23 1924 11/30/23 0249  WBC  --   --  8.9  HGB 11.4* 11.6* 9.4*  HCT 34.0* 35.0* 28.9*  PLT  --   --  275   BMET Recent Labs    11/30/23 0249  NA 130*  K 5.6*  CL 94*  CO2 24  GLUCOSE 88  BUN 56*  CREATININE 11.83*  CALCIUM  9.2   LFT No results for input(s): PROT, ALBUMIN , AST, ALT, ALKPHOS, BILITOT, BILIDIR, IBILI in the last 72 hours. PT/INR No results for input(s): LABPROT, INR in the last 72 hours.   Scheduled Meds:  atorvastatin   40 mg Oral Daily   Chlorhexidine  Gluconate Cloth  6 each Topical Q0600   cinacalcet   30 mg Oral Q  breakfast   darbepoetin (ARANESP ) injection - DIALYSIS  100 mcg Subcutaneous Q Mon-1800   doxercalciferol   3 mcg Intravenous Q M,W,F-HD   feeding supplement  237 mL Oral BID BM   heparin   6,000 Units Dialysis Once in dialysis   insulin  aspart  0-5 Units Subcutaneous QHS   insulin  aspart  0-9 Units Subcutaneous TID WC   insulin  glargine-yfgn  25 Units Subcutaneous Daily   midodrine   10 mg Oral TID WC   multivitamin  1 tablet Oral QHS   pantoprazole  (PROTONIX ) IV  40 mg Intravenous BID   sevelamer  carbonate  4,000 mg Oral TID WC   thiamine   100 mg Oral Daily   Continuous Infusions:  anticoagulant sodium citrate         Patient profile:   63 y.o. male with a past medical history significant for ESRD on HD (Monday Wednesday Friday), heart failure, type 2 diabetes, CVA, and status post right BKA as well as obesity who initially presented to the hospital on 11/08/2023 for syncope during hemodialysis. We are consulted now in regards to a GI Bleed    Impression:   1.  GI bleed: Hemoglobin 16.2 1/5 --> 11.6 today. no prior colonoscopy,  Hemoccult positive on 11/20/2023, GI pathogen panel positive for norovirus. Iron  46, saturation 16%, ferritin 421.  >10 bloody bowel movements noted 1/09 likely in  the setting of ischemic colitis related to norovirus infection.  Patient's hemoglobin does continue to downtrend over time and patient still has some bloody BMs today. 2.  Acute metabolic encephalopathy: initial concern for encephalitis versus meningitis, workup so far unrevealing, neurology recommending continued supportive care with anticipation of patient will have a prolonged recovery encephalopathy related to hypoglycemia. mental status stable 3.  NSTEMI: Cardiology initially consulted, troponin elevation with a peak of 8942, echo with LVEF 30-35% and regional wall motion abnormalities, cardiology now reconsulted since mental status has improved. Cardiology notes he is high risk for EGD/Colonoscopy,  but feels reasonable plan prior to undergoing heart cath. 4.  ESRD on HD 5.  Acute on chronic diastolic heart failure 6.  Diarrhea: C. difficile negative, GI path panel being sent today 7.  Diabetes type 2 8.  History of CVA   Plan:   -- Continue daily CBC and transfuse as needed to maintain HGB > 7  -- Will plan for CTA GI bleed today to look for an etiology of his bleed and to see if it confirms signs of infectious colitis that could be a source of bleeding. -- Could potentially revisit the idea of endoscopic evaluation, though patient would be considered high risk as a result of NSTEMI and CHF -- Cardiology is planning for further outpatient evaluation for his NSTEMI  Rosario JAYSON Kidney MD 11/30/2023, 12:14 PM

## 2023-11-30 NOTE — Progress Notes (Signed)
 PROGRESS NOTE  [name] OROZCO Sr.  DOB: 02-24-1961  PCP: Freddrick Johns FMW:[id number]  DOA: 11/08/2023  LOS: 22 days  Hospital Day: 23  Brief narrative: [name] [name] is a 63 y.o. male with PMH significant for ESRD-HD-MWF, DM2, HTN, nonischemic cardiomyopathy, mild systolic CHF, prior right BKA 87/77, patient was brought to the ED after experiencing an episode of chest pain, hypotension and near syncope during hemodialysis.  His initial blood pressure was low in 75/59 On further workup in the ED, troponin was significantly elevated and peaked at 9000 He was diagnosed with NSTEMI, started on heparin  drip. Admitted to [facility]  Seen by cardiology  See below for details  Hospital course was also complicated altered mental status.  Found to have influenza A positive.  Mental status gradually improved Currently followed by GI and cardiology  Subjective: Patient was seen and examined this morning in dialysis.  Not in distress. Patient reports he is continue to have blood in his stool.  I do not see clear documentation from nursing. GI follow-up appreciated.  Assessment and plan: Acute GI bleeding  Norovirus diarrhea Continues to have GI bleeding.  Overall blood loss in stool seems to be improving after aspirin  and Plavix  were stopped but now dropping hemoglobin. Diarrhea is partly from norovirus infection as well  GI following.  May need CT angio versus endoscopic evaluation.   Continue diet as tolerated Recent Labs    12/17/22 1244 12/17/22 1245 10/22/23 2343 11/16/23 1518 11/17/23 0245 11/24/23 1306 11/25/23 1108 11/27/23 0435 11/27/23 1319 11/27/23 1924 11/30/23 0249  HGB  --   --    < >  --    < >  --  14.0 12.0* 11.4* 11.6* 9.4*  MCV  --   --    < >  --    < >  --  87.3  --   --   --  87.8  VITAMINB12  --   --   --  2,058*  --   --   --   --   --   --   --   FOLATE  --   --   --  16.6  --   --   --   --   --   --   --   FERRITIN 1,481*  --   --   --   --  421*  --   --   --    --   --   TIBC  --  190*  --   --   --  294  --   --   --   --   --   IRON   --  72  --   --   --  46  --   --   --   --   --    < > = values in this interval not displayed.   NSTEMI Presented with chest pain, hypertension, syncope, significantly elevated troponin  12/23 echocardiogram showed EF low at 30 to 35% with regional WMA, moderate to severe aortic stenosis Initially started on medical management with IV heparin  drip, aspirin  and Lipitor Cardiology wanted to hold off on further invasive study until mental status improved. Cardiology was reconsulted on 1/7.  Plavix  was added on 1/9.  However patient started having large-volume bloody diarrhea and hence DAPT was stopped. Since patient has shown that he will not be able to tolerate DAPT, cardiology is not planning for cardiac cath/intervention.  Medical management without DAPT planned Probably course reviewed.  Acute exacerbation of combined systolic and diastolic CHF Moderate to severe aortic stenosis Echo from 10/1811/23 compared.  Noted a drop in EF from 45-50% to 30-35%, along with grade 3 diastolic dysfunction as well as moderate to severe aortic valve stenosis. Cardiology following Currently on midodrine  10 mg 3 times daily.  Metoprolol  held because of low blood pressure. Also on dialysis for fluid management  ESRD-HD-MWF Nephrology on board  Influenza A positive Detected on 12/30.  Completed the course of Tamiflu .  Acute metabolic encephalopathy Patient hospital stay was complicated by altered mental status.  There was initial concern for encephalitis vs meningitis. Neurology was consulted.  Patient while started empirically on broad-spectrum antibiotics, antiviral. 12/27 and 1/1, EEG did not show any evidence of seizure activity. 1/3, MRI brain did not any acute abnormality. 1/3, lumbar puncture done. CSF labs significant for elevated glucose and protein, not consistent with meningitis; Meningitis/encephalitis panel  negative for pathogen. Antibiotics were stopped. Neurology recommendation for continued supportive care with anticipation that patient will have a prolonged recovery of encephalopathy related to hyperglycemia.  ID and neurology signed off. Mental status gradually improved  Type 2 diabetes mellitus uncontrolled A1c more than 15.51 10/24/2023 Currently on Semglee  25 units daily SSI/Accu-Cheks.  Blood sugar level controlled.  Continue to monitor Recent Labs  Lab 11/29/23 0603 11/29/23 1115 11/29/23 1644 11/29/23 2103 11/30/23 0621  GLUCAP 185* 157* 121* 94 88   H/o CVA Continue ASA, and Lipitor   Mobility: PT eval obtained.  SNF recommended  Goals of care   Code Status: Full Code     DVT prophylaxis:  Place and maintain sequential compression device Start: 11/20/23 0706 SCDs Start: 11/09/23 1558   Antimicrobials: Completed a course Fluid: None Consultants: GI, cardiology, nephrology Family Communication: Family not at bedside  Status: Inpatient Level of care:  Progressive   Patient is from: Home Needs to continue in-hospital care: Ongoing diarrhea and blood in the stool..  GI following. Anticipated d/c to: SNF recommended by PT  Diet:  Diet Order             DIET SOFT Fluid consistency: Thin; Fluid restriction: 2000 mL Fluid  Diet effective now                   Scheduled Meds:  atorvastatin   40 mg Oral Daily   Chlorhexidine  Gluconate Cloth  6 each Topical Q0600   cinacalcet   30 mg Oral Q breakfast   darbepoetin (ARANESP ) injection - DIALYSIS  100 mcg Subcutaneous Q Mon-1800   doxercalciferol   3 mcg Intravenous Q M,W,F-HD   feeding supplement  237 mL Oral BID BM   heparin   6,000 Units Dialysis Once in dialysis   insulin  aspart  0-5 Units Subcutaneous QHS   insulin  aspart  0-9 Units Subcutaneous TID WC   insulin  glargine-yfgn  25 Units Subcutaneous Daily   midodrine   10 mg Oral TID WC   multivitamin  1 tablet Oral QHS   pantoprazole  (PROTONIX ) IV  40  mg Intravenous BID   sevelamer  carbonate  4,000 mg Oral TID WC   thiamine   100 mg Oral Daily    PRN meds: acetaminophen  **OR** acetaminophen , alteplase , anticoagulant sodium citrate , feeding supplement (NEPRO CARB STEADY), heparin , lidocaine  (PF), lidocaine -prilocaine , ondansetron  **OR** ondansetron  (ZOFRAN ) IV, mouth rinse, pentafluoroprop-tetrafluoroeth, sevelamer  carbonate   Infusions:   anticoagulant sodium citrate       Antimicrobials: Anti-infectives (From admission, onward)    Start  Dose/Rate Route Frequency Ordered Stop   11/18/23 1800  oseltamivir  (TAMIFLU ) capsule 30 mg        30 mg Oral Every M-W-F (Hemodialysis) 11/18/23 1116 11/23/23 1159   11/17/23 1800  oseltamivir  (TAMIFLU ) capsule 30 mg  Status:  Discontinued        30 mg Oral Every T-Th-Sa (1800) 11/17/23 0800 11/18/23 1116   11/17/23 1115  vancomycin  (VANCOCIN ) IVPB 1000 mg/200 mL premix  Status:  Discontinued        1,000 mg 200 mL/hr over 60 Minutes Intravenous Once in dialysis 11/17/23 1021 11/18/23 1331   11/17/23 1000  oseltamivir  (TAMIFLU ) capsule 30 mg        30 mg Oral  Once 11/17/23 0759 11/17/23 0952   11/16/23 1200  vancomycin  (VANCOCIN ) IVPB 1000 mg/200 mL premix  Status:  Discontinued        1,000 mg 200 mL/hr over 60 Minutes Intravenous Every M-W-F (Hemodialysis) 11/15/23 1658 11/18/23 1306   11/12/23 2300  vancomycin  (VANCOCIN ) IVPB 1000 mg/200 mL premix        1,000 mg 200 mL/hr over 60 Minutes Intravenous  Once 11/12/23 2226 11/13/23 0131   11/12/23 2000  cefTRIAXone  (ROCEPHIN ) 2 g in sodium chloride  0.9 % 100 mL IVPB  Status:  Discontinued        2 g 200 mL/hr over 30 Minutes Intravenous Every 12 hours 11/12/23 1627 11/18/23 1306   11/12/23 2000  ampicillin  (OMNIPEN) 2 g in sodium chloride  0.9 % 100 mL IVPB  Status:  Discontinued        2 g 300 mL/hr over 20 Minutes Intravenous Every 12 hours 11/12/23 1627 11/18/23 1306   11/12/23 1730  acyclovir  (ZOVIRAX ) 450 mg in dextrose  5 % 100 mL IVPB   Status:  Discontinued        5 mg/kg  89.6 kg (Adjusted) 109 mL/hr over 60 Minutes Intravenous Every 24 hours 11/12/23 1627 11/18/23 1306   11/11/23 1915  piperacillin -tazobactam (ZOSYN ) IVPB 2.25 g  Status:  Discontinued        2.25 g 100 mL/hr over 30 Minutes Intravenous Every 8 hours 11/11/23 1825 11/12/23 1615   11/11/23 1245  metroNIDAZOLE  (FLAGYL ) IVPB 500 mg  Status:  Discontinued        500 mg 100 mL/hr over 60 Minutes Intravenous Every 12 hours 11/11/23 1146 11/11/23 1825   11/11/23 1200  vancomycin  (VANCOCIN ) IVPB 1000 mg/200 mL premix  Status:  Discontinued        1,000 mg 200 mL/hr over 60 Minutes Intravenous Every M-W-F (Hemodialysis) 11/10/23 1458 11/15/23 1658   11/10/23 1545  vancomycin  (VANCOCIN ) IVPB 1000 mg/200 mL premix       Placed in Followed by Linked Group   1,000 mg 200 mL/hr over 60 Minutes Intravenous  Once 11/10/23 1458 11/10/23 1917   11/10/23 1545  vancomycin  (VANCOCIN ) IVPB 1000 mg/200 mL premix       Placed in Followed by Linked Group   1,000 mg 200 mL/hr over 60 Minutes Intravenous  Once 11/10/23 1458 11/10/23 1815   11/10/23 1545  ceFEPIme  (MAXIPIME ) 1 g in sodium chloride  0.9 % 100 mL IVPB  Status:  Discontinued        1 g 200 mL/hr over 30 Minutes Intravenous Every M-W-F 11/10/23 1458 11/10/23 1520   11/10/23 1545  ceFEPIme  (MAXIPIME ) 2 g in sodium chloride  0.9 % 100 mL IVPB  Status:  Discontinued        2 g 200 mL/hr over 30 Minutes Intravenous Every  M-W-F 11/10/23 1520 11/11/23 1810       Objective: Vitals:   11/30/23 1213 11/30/23 1231  BP: 107/60 110/64  Pulse:  87  Resp:  17  Temp:  98.5 F (36.9 C)  SpO2:  98%    Intake/Output Summary (Last 24 hours) at 11/30/2023 1245 Last data filed at 11/30/2023 1213 Gross per 24 hour  Intake 120 ml  Output 2700 ml  Net -2580 ml   Filed Weights   11/30/23 0500 11/30/23 0810 11/30/23 1231  Weight: 102.7 kg 103 kg 100.3 kg   Weight change: 0.8 kg Body mass index is 29.17 kg/m.    Physical Exam: General exam: Pleasant, middle-aged African-American male. Not in distress.  Skin: No rashes, lesions or ulcers. HEENT: Atraumatic, normocephalic, no obvious bleeding Lungs: Clear to auscultation bilaterally CVS: S1, S2, no murmur. GI/Abd: Soft, nontender, nondistended, bowel sound present CNS: Alert, awake, oriented to place, person and time.   Psychiatry: Mood appropriate. Extremities: No pedal edema, no calf tenderness.  Data Review: I have personally reviewed the laboratory data and studies available.  F/u labs  Unresulted Labs (From admission, onward)     Start     Ordered   11/30/23 0947  Iron  and TIBC  Add-on,   AD       Question:  Specimen collection method  Answer:  Lab=Lab collect   11/30/23 0946   11/30/23 0947  Ferritin  Add-on,   AD       Question:  Specimen collection method  Answer:  Lab=Lab collect   11/30/23 0946   11/19/23 1134  Oligoclonal bands, CSF + serum  (Oligoclonal Bands, CSF + Serum panel)  Once,   R        11/19/23 1134   11/19/23 1134  Miscellaneous [facility] test (send-out)  Once,   R       Comments: Autoimmune Encephalitis Ab panel - CSF   Question Answer Comment  Test name / description: Autoimmune Encephalitis Ab panel - CSF   Release to patient Immediate      11/19/23 1134   Unscheduled  Occult blood card to lab, stool  As needed,   R      11/19/23 1729   Signed and Held  Renal function panel  Once,   R       Question:  Specimen collection method  Answer:  Lab=Lab collect   Signed and Held   Signed and Held  CBC  Once,   R       Question:  Specimen collection method  Answer:  Lab=Lab collect   Signed and Held            Total time spent in review of labs and imaging, patient evaluation, formulation of plan, documentation and communication with family: 45 minutes  Signed, Chapman Rota, MD [facility] 11/30/2023

## 2023-11-30 NOTE — Procedures (Signed)
 I was present at this dialysis session. I have reviewed the session itself and made appropriate changes.   K of 5.6 on 2 K bath.  Goal UF of 2L.  Pt w/o complaint at this time. Using LUE AVF.    Hb drifted down to 9.4 with hematochezia and GI following.  Rpt Fe panel.  Give darbe today.    Filed Weights   11/28/23 0407 11/29/23 0600 11/30/23 0500  Weight: 100.7 kg 101.9 kg 102.7 kg    Recent Labs  Lab 11/30/23 0249  NA 130*  K 5.6*  CL 94*  CO2 24  GLUCOSE 88  BUN 56*  CREATININE 11.83*  CALCIUM  9.2    Recent Labs  Lab 11/25/23 1108 11/27/23 0435 11/27/23 1319 11/27/23 1924 11/30/23 0249  WBC 8.9  --   --   --  8.9  NEUTROABS  --   --   --   --  4.9  HGB 14.0   < > 11.4* 11.6* 9.4*  HCT 42.7   < > 34.0* 35.0* 28.9*  MCV 87.3  --   --   --  87.8  PLT 387  --   --   --  275   < > = values in this interval not displayed.    Scheduled Meds:  atorvastatin   40 mg Oral Daily   Chlorhexidine  Gluconate Cloth  6 each Topical Q0600   Chlorhexidine  Gluconate Cloth  6 each Topical Q0600   Chlorhexidine  Gluconate Cloth  6 each Topical Q0600   cinacalcet   30 mg Oral Q breakfast   doxercalciferol   3 mcg Intravenous Q M,W,F-HD   feeding supplement  237 mL Oral BID BM   heparin   6,000 Units Dialysis Once in dialysis   insulin  aspart  0-5 Units Subcutaneous QHS   insulin  aspart  0-9 Units Subcutaneous TID WC   insulin  glargine-yfgn  25 Units Subcutaneous Daily   midodrine   10 mg Oral TID WC   multivitamin  1 tablet Oral QHS   pantoprazole  (PROTONIX ) IV  40 mg Intravenous BID   sevelamer  carbonate  4,000 mg Oral TID WC   thiamine   100 mg Oral Daily   Continuous Infusions:  anticoagulant sodium citrate      PRN Meds:.acetaminophen  **OR** acetaminophen , alteplase , anticoagulant sodium citrate , feeding supplement (NEPRO CARB STEADY), heparin , lidocaine  (PF), lidocaine -prilocaine , ondansetron  **OR** ondansetron  (ZOFRAN ) IV, mouth rinse, mouth rinse, mouth rinse,  pentafluoroprop-tetrafluoroeth, sevelamer  carbonate   Bernardino Gasman  MD 11/30/2023, 9:42 AM

## 2023-11-30 NOTE — TOC Progression Note (Addendum)
 Transition of Care (TOC) - Progression Note    Patient Details  Name: [name] PORE Sr. MRN: [id number] Date of Birth: 16-Oct-1961  Transition of Care [facility]) CM/SW Contact  Luise JAYSON Pan, [place] Phone Number: 11/30/2023, 1:26 PM  Clinical Narrative:   Per daily meeting with treatment team, pt is not medically stable to dc at this time.     Expected Discharge Plan: Skilled Nursing Facility Barriers to Discharge: SNF Pending bed offer, Continued Medical Work up  Expected Discharge Plan and Services       Living arrangements for the past 2 months: Single Family Home                           HH Arranged:  (Active with [facility])           Social Determinants of Health (SDOH) Interventions SDOH Screenings   Food Insecurity: Patient Unable To Answer (11/10/2023)  Housing: Patient Unable To Answer (11/10/2023)  Transportation Needs: Unknown (11/10/2023)  Utilities: Patient Unable To Answer (11/10/2023)  Alcohol Screen: Low Risk  (02/04/2022)  Depression (PHQ2-9): Low Risk  (08/06/2022)  Tobacco Use: Low Risk  (11/20/2023)    Readmission Risk Interventions    11/05/2022    3:08 PM  Readmission Risk Prevention Plan  Transportation Screening Complete  Medication Review (RN Care Manager) Complete  PCP or Specialist appointment within 3-5 days of discharge Complete  HRI or Home Care Consult Complete  SW Recovery Care/Counseling Consult Complete  Palliative Care Screening Complete  Skilled Nursing Facility Complete

## 2023-11-30 NOTE — Progress Notes (Signed)
 Working with local [facility] for out-pt HD at d/c. Contacted pt's RN, PT, and CSW to see if there is an update regarding pt being able to sit in the recliner for an extended period of time. Pt has to be able to sift in a chair for an extended period of time to be deemed appropriate for out-pt HD. Will assist as needed.   Randine Mungo Renal Navigator [phone]

## 2023-11-30 NOTE — Progress Notes (Signed)
 PT Cancellation Note  Patient Details Name: [name] [name] Sr. MRN: [id number] DOB: 11/08/1961   Cancelled Treatment:    Reason Eval/Treat Not Completed: Patient at procedure or test/unavailable (Pt still in HD.  Will return tomorrow.)   [name] 11/30/2023, 12:21 PM [name],PT Acute Rehab Services [phone]

## 2023-11-30 NOTE — Progress Notes (Signed)
 PT Cancellation Note  Patient Details Name: [name] FIORELLO Sr. MRN: [id number] DOB: 07-25-1961   Cancelled Treatment:    Reason Eval/Treat Not Completed: Patient at procedure or test/unavailable (Pt in HD.  Will check back as time allows.)   [name] 11/30/2023, 8:50 AM [name] M,PT Acute Rehab Services [phone]

## 2023-11-30 NOTE — Progress Notes (Signed)
 Received patient in bed.Awake,alert and oriented x 4. Consent verified.  Access used: Left upper AVF that worked well.  Duration of treatment 3.75 hours.  Net uf : 2.5  Tolerated treatment : Yes.  Medicines given: Heparin  3,000 units pre-run dose.                              Hectorol  3 mcg. He ate a whole sandwich an hour ago  Hand off to the patient's nurse : Back into his room with stable medical condition via transporter.

## 2023-12-01 DIAGNOSIS — R627 Adult failure to thrive: Secondary | ICD-10-CM

## 2023-12-01 DIAGNOSIS — I5023 Acute on chronic systolic (congestive) heart failure: Secondary | ICD-10-CM | POA: Diagnosis not present

## 2023-12-01 DIAGNOSIS — Z515 Encounter for palliative care: Secondary | ICD-10-CM

## 2023-12-01 DIAGNOSIS — I214 Non-ST elevation (NSTEMI) myocardial infarction: Secondary | ICD-10-CM | POA: Diagnosis not present

## 2023-12-01 DIAGNOSIS — R197 Diarrhea, unspecified: Secondary | ICD-10-CM | POA: Diagnosis not present

## 2023-12-01 DIAGNOSIS — K922 Gastrointestinal hemorrhage, unspecified: Secondary | ICD-10-CM | POA: Diagnosis not present

## 2023-12-01 DIAGNOSIS — G9341 Metabolic encephalopathy: Secondary | ICD-10-CM | POA: Diagnosis not present

## 2023-12-01 DIAGNOSIS — D509 Iron deficiency anemia, unspecified: Secondary | ICD-10-CM

## 2023-12-01 DIAGNOSIS — Z66 Do not resuscitate: Secondary | ICD-10-CM

## 2023-12-01 DIAGNOSIS — N186 End stage renal disease: Secondary | ICD-10-CM | POA: Diagnosis not present

## 2023-12-01 LAB — CBC
HCT: 30.4 % — ABNORMAL LOW (ref 39.0–52.0)
Hemoglobin: 9.8 g/dL — ABNORMAL LOW (ref 13.0–17.0)
MCH: 28.8 pg (ref 26.0–34.0)
MCHC: 32.2 g/dL (ref 30.0–36.0)
MCV: 89.4 fL (ref 80.0–100.0)
Platelets: 247 10*3/uL (ref 150–400)
RBC: 3.4 MIL/uL — ABNORMAL LOW (ref 4.22–5.81)
RDW: 15.9 % — ABNORMAL HIGH (ref 11.5–15.5)
WBC: 7 10*3/uL (ref 4.0–10.5)
nRBC: 0 % (ref 0.0–0.2)

## 2023-12-01 LAB — GLUCOSE, CAPILLARY
Glucose-Capillary: 148 mg/dL — ABNORMAL HIGH (ref 70–99)
Glucose-Capillary: 219 mg/dL — ABNORMAL HIGH (ref 70–99)
Glucose-Capillary: 104 mg/dL — ABNORMAL HIGH (ref 70–99)
Glucose-Capillary: 180 mg/dL — ABNORMAL HIGH (ref 70–99)

## 2023-12-01 MED ORDER — SODIUM CHLORIDE 0.9 % IV SOLN
200.0000 mg | INTRAVENOUS | Status: AC
  Administered 2023-12-01 – 2023-12-03 (×2): 200 mg via INTRAVENOUS
  Filled 2023-12-01 (×3): qty 10

## 2023-12-01 MED ORDER — IRON SUCROSE 200 MG IVPB - SIMPLE MED
200.0000 mg | Status: DC
  Filled 2023-12-01: qty 110

## 2023-12-01 MED ORDER — SODIUM CHLORIDE 0.9 % IV SOLN
INTRAVENOUS | Status: DC
Start: 2023-12-01 — End: 2023-12-02

## 2023-12-01 MED ORDER — PEG-KCL-NACL-NASULF-NA ASC-C 100 G PO SOLR
0.5000 | Freq: Once | ORAL | Status: AC
  Administered 2023-12-01: 100 g via ORAL
  Filled 2023-12-01: qty 1

## 2023-12-01 NOTE — Progress Notes (Signed)
 [facility] Progress Note   Subjective:   Patient seen and examined at bedside.  No c/o.  HD yesterday with 2.5L UF. CTA Ab/P yesterday neg for GI bleeding.    Objective Vitals:   12/01/23 0040 12/01/23 0500 12/01/23 0542 12/01/23 0746  BP: 111/61  110/63 130/77  Pulse: 86  82 88  Resp:   20 (!) 21  Temp: 98.8 F (37.1 C)  98.4 F (36.9 C) 98.7 F (37.1 C)  TempSrc: Oral  Oral Oral  SpO2: 96%  97% 97%  Weight:  100.8 kg    Height:       Physical Exam General:chronically ill appearing male in NAD; lying in bed Heart:RRR,+3/6 systolic murmur Lungs:CTAB, nml WOB on RA Abdomen:soft, NTND Extremities:no LE edema, R BKA Dialysis Access: LU AVF +b/t   Filed Weights   11/30/23 0810 11/30/23 1231 12/01/23 0500  Weight: 103 kg 100.3 kg 100.8 kg    Intake/Output Summary (Last 24 hours) at 12/01/2023 1040 Last data filed at 12/01/2023 0900 Gross per 24 hour  Intake 420 ml  Output 2700 ml  Net -2280 ml    Additional Objective Labs: Basic Metabolic Panel: Recent Labs  Lab 11/25/23 0257 11/30/23 0249  NA 131* 130*  K 4.9 5.6*  CL 87* 94*  CO2 27 24  GLUCOSE 265* 88  BUN 63* 56*  CREATININE 11.32* 11.83*  CALCIUM  9.9 9.2   Liver Function Tests: No results for input(s): AST, ALT, ALKPHOS, BILITOT, PROT, ALBUMIN  in the last 168 hours.   CBC: Recent Labs  Lab 11/25/23 1108 11/27/23 0435 11/27/23 1924 11/30/23 0249 12/01/23 0246  WBC 8.9  --   --  8.9 7.0  NEUTROABS  --   --   --  4.9  --   HGB 14.0   < > 11.6* 9.4* 9.8*  HCT 42.7   < > 35.0* 28.9* 30.4*  MCV 87.3  --   --  87.8 89.4  PLT 387  --   --  275 247   < > = values in this interval not displayed.   Blood Culture    Component Value Date/Time   SDES CSF 11/19/2023 1134   SPECREQUEST NONE 11/19/2023 1134   CULT  11/19/2023 1134    NO GROWTH 3 DAYS Performed at [facility], [address] [place], [place] [address]    REPTSTATUS 11/23/2023 FINAL 11/19/2023 1134     CBG: Recent Labs  Lab 11/30/23 0621 11/30/23 1312 11/30/23 1622 11/30/23 2051 12/01/23 0615  GLUCAP 88 87 154* 191* 148*    Medications:    atorvastatin   40 mg Oral Daily   Chlorhexidine  Gluconate Cloth  6 each Topical Q0600   cinacalcet   30 mg Oral Q breakfast   darbepoetin (ARANESP ) injection - DIALYSIS  100 mcg Subcutaneous Q Mon-1800   doxercalciferol   3 mcg Intravenous Q M,W,F-HD   feeding supplement  237 mL Oral BID BM   insulin  aspart  0-5 Units Subcutaneous QHS   insulin  aspart  0-9 Units Subcutaneous TID WC   insulin  glargine-yfgn  25 Units Subcutaneous Daily   midodrine   10 mg Oral TID WC   multivitamin  1 tablet Oral QHS   pantoprazole  (PROTONIX ) IV  40 mg Intravenous BID   peg 3350  powder  0.5 kit Oral Once   sevelamer  carbonate  4,000 mg Oral TID WC   thiamine   100 mg Oral Daily    Dialysis Orders: [facility] MWF 4h  450/700  105kg   2/2 bath   AVF   Heparin  7000 + 2000 midrun - Hectorol  7 mcg IV three times per week   Assessment/Plan: 1.AMS: Was on cefepime  which has been stopped. Per primary team some concern for encephalitis/meningitis, was on empiric antibiotics/antivirals until seen by ID on 11/18/23 and all was stopped.  S/P LP which ruled out bacterail meningitis/encephalitis. He did test + for flu A by resp panel. MRI without acute abnormality.  ID stopped abx and antivirals and signed off. Getting trial of high-dose thiamine . Mental status has sig improved - appears close to baseline today. 2.  ESRD - usual HD is MWF. Next HD 12/01/22 try to do so in a chair.  Hold heparin .  3. NSTEMI- work up per cardiology/primary. No plan for invasive procedure and not DAPT candidate per cardiology.  Cardiology signed off.  4. Volume - under EDW. Will likely need lower dry weight on d/c. UF as tolerated.  5. Fever/Hypotension- BC with NGTD. Was started on board spectrum antibiotics/antivirals but now discontinued.  ID signed off. 6.  HFrEF/history of severe AS- attempting to optimize volume with dialysis. ECHO  EF 30 to 35%. Seen by Cards - medical management of AS. Fluid restrictions and daily weights. On midodrine  10 mg 3 times daily.  7. Anemia  - Hgb 9.8 drifting down and GI Following.  ESA 1/13. Start Fe load today 1/14.  8. Metabolic bone disease -Phos at goal. Continue renvela . VDRA on hold d/t high calcium ; coming down. 9. Debility - memory is better however he remains debilitated.Needs to demonstrate HD in chair for return to outpt HD.  10. GIB/Norovirus - C diff neg. GI following. Bloody BM reported.  1 unit pRBC given 11/26/22.    Bernardino KATHEE Gasman, MD  [facility] 12/01/2023,10:40 AM  LOS: 23 days

## 2023-12-01 NOTE — Progress Notes (Addendum)
 Progress Note   LOS: 23 days   Chief Complaint: GI bleed   Subjective   Patient states that he has continued to have black and red stools.  Patient does not think that the amount of blood in his stool has improved over time.  Hemoglobin has remained stable.    Objective   Vital signs in last 24 hours: Temp:  [98.4 F (36.9 C)-98.9 F (37.2 C)] 98.7 F (37.1 C) (01/14 0746) Pulse Rate:  [82-89] 88 (01/14 0746) Resp:  [16-21] 21 (01/14 0746) BP: (107-130)/(56-77) 130/77 (01/14 0746) SpO2:  [96 %-98 %] 97 % (01/14 0746) Weight:  [100.3 kg-100.8 kg] 100.8 kg (01/14 0500) Last BM Date : 11/30/23 Last BM recorded by nurses in past 5 days Stool Type: Type 6 (Mushy consistency with ragged edges) (12/01/2023 10:24 AM)  General:   male in no acute distress  Heart:  Regular rate and rhythm; no murmurs Pulm: Clear anteriorly; no wheezing Abdomen: soft, nondistended, normal bowel sounds in all quadrants. Nontender without guarding. No organomegaly appreciated. Extremities:  No edema Neurologic:  Alert and  oriented x4;  No focal deficits.  Psych:  Cooperative. Normal mood and affect.  Intake/Output from previous day: 01/13 0701 - 01/14 0700 In: 180 [P.O.:180] Out: 2700  Intake/Output this shift: Total I/O In: 240 [P.O.:240] Out: -   Studies/Results: CT ANGIO GI BLEED Result Date: 11/30/2023 CLINICAL DATA:  Anemia, hematochezia EXAM: CTA ABDOMEN AND PELVIS WITHOUT AND WITH CONTRAST TECHNIQUE: Multidetector CT imaging of the abdomen and pelvis was performed using the standard protocol during bolus administration of intravenous contrast. Multiplanar reconstructed images and MIPs were obtained and reviewed to evaluate the vascular anatomy. RADIATION DOSE REDUCTION: This exam was performed according to the departmental dose-optimization program which includes automated exposure control, adjustment of the mA and/or kV according to patient size and/or use of iterative reconstruction  technique. CONTRAST:  OMNIPAQUE  IOHEXOL  350 MG/ML SOLN COMPARISON:  CT scan of the abdomen and pelvis 10/24/2023 FINDINGS: VASCULAR Aorta: No evidence of aneurysm. Scattered calcified plaque throughout the abdominal aorta. No dissection. Celiac: Patent without evidence of aneurysm, dissection, vasculitis or significant stenosis. SMA: Patent without evidence of aneurysm, dissection, vasculitis or significant stenosis. Renals: Calcified atherosclerotic plaque at the origin of both renal arteries. Probable mild bilateral renal artery stenosis. IMA: Patent without evidence of aneurysm, dissection, vasculitis or significant stenosis. Inflow: Patent without evidence of aneurysm, dissection, vasculitis or significant stenosis. Proximal Outflow: Bilateral common femoral and visualized portions of the superficial and profunda femoral arteries are patent without evidence of aneurysm, dissection, vasculitis or significant stenosis. Veins: No focal venous abnormality. Review of the MIP images confirms the above findings. NON-VASCULAR Lower chest: No acute abnormality.  Cardiomegaly. Hepatobiliary: Normal hepatic contour and morphology. No discrete hepatic lesion. Small stones present within the gallbladder lumen. No biliary ductal dilatation. Pancreas: Unremarkable. No pancreatic ductal dilatation or surrounding inflammatory changes. Spleen: Normal in size without focal abnormality. Adrenals/Urinary Tract: Normal adrenal glands. Bilateral renal atrophy. Multifocal small circumscribed water enhancing cysts consistent with simple cysts. No imaging follow-up is recommended. No hydronephrosis or enhancing renal mass. The ureters are unremarkable. The bladder is decompressed. Stomach/Bowel: No evidence of contrast extravasation to suggest acute GI bleeding. No focal bowel wall thickening or evidence of obstruction. Lymphatic: No suspicious lymphadenopathy. Reproductive: Prostate is unremarkable. Other: No abdominal wall hernia  or abnormality. No abdominopelvic ascites. Musculoskeletal: No acute or significant osseous findings. Chronic bilateral L4 pars defects with mild grade 1 anterolisthesis of L4  on L5. No lytic or blastic osseous lesion. IMPRESSION: 1. No evidence of active GI bleeding at the imaging. 2. No acute abnormality within the abdomen or pelvis. 3. Extensive atherosclerotic vascular calcifications throughout the aorta without aneurysm or dissection. 4. Mild bilateral renal artery stenoses. 5. Cardiomegaly. 6. Cholelithiasis without secondary findings of acute cholecystitis. 7. Mild bilateral renal atrophy. 8. 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 Aortic Atherosclerosis (ICD10-I70.0). Electronically Signed   By: Wilkie Lent M.D.   On: 11/30/2023 16:04    Lab Results: Recent Labs    11/30/23 0249 12/01/23 0246  WBC 8.9 7.0  HGB 9.4* 9.8*  HCT 28.9* 30.4*  PLT 275 247   BMET Recent Labs    11/30/23 0249  NA 130*  K 5.6*  CL 94*  CO2 24  GLUCOSE 88  BUN 56*  CREATININE 11.83*  CALCIUM  9.2   LFT No results for input(s): PROT, ALBUMIN , AST, ALT, ALKPHOS, BILITOT, BILIDIR, IBILI in the last 72 hours. PT/INR No results for input(s): LABPROT, INR in the last 72 hours.   Scheduled Meds:  atorvastatin   40 mg Oral Daily   Chlorhexidine  Gluconate Cloth  6 each Topical Q0600   cinacalcet   30 mg Oral Q breakfast   darbepoetin (ARANESP ) injection - DIALYSIS  100 mcg Subcutaneous Q Mon-1800   doxercalciferol   3 mcg Intravenous Q M,W,F-HD   feeding supplement  237 mL Oral BID BM   insulin  aspart  0-5 Units Subcutaneous QHS   insulin  aspart  0-9 Units Subcutaneous TID WC   insulin  glargine-yfgn  25 Units Subcutaneous Daily   midodrine   10 mg Oral TID WC   multivitamin  1 tablet Oral QHS   pantoprazole  (PROTONIX ) IV  40 mg Intravenous BID   peg 3350  powder  0.5 kit Oral Once   sevelamer  carbonate  4,000 mg Oral TID WC   thiamine    100 mg Oral Daily   Continuous Infusions:  iron  sucrose        Patient profile:   63 y.o. male with a past medical history significant for ESRD on HD (Monday Wednesday Friday), heart failure, type 2 diabetes, CVA, and status post right BKA as well as obesity who initially presented to the hospital on 11/08/2023 for syncope during hemodialysis. We are consulted now in regards to a GI Bleed    Impression:   1.  GI bleed: Hemoglobin 16.2 1/5 --> 11.6 today. no prior colonoscopy,  Hemoccult positive on 11/20/2023, GI pathogen panel positive for norovirus.  Iron  studies suggestive of iron  deficiency anemia.  Patient has had hematochezia and melena.  This was suspected to be due to ischemic colitis related to his norovirus infection.  Patient's hemoglobin has finally stabilized today, but patient continues to describe hematochezia and melena.  CT GI bleed yesterday did not reveal an etiology for his bleeding.  I offered the patient an EGD and colonoscopy for further evaluation, but the patient declined.  I then offered the patient the option to get a capsule endoscopy, and he is open to this.  2.  Acute metabolic encephalopathy: initial concern for encephalitis versus meningitis, workup so far unrevealing, neurology recommending continued supportive care with anticipation of patient will have a prolonged recovery encephalopathy related to hypoglycemia.  Mental status is gradually improved. 3.  NSTEMI: Cardiology initially consulted, troponin elevation with a peak of 8942, echo with LVEF 30-35% and regional wall motion abnormalities, cardiology now reconsulted since mental status has improved. Cardiology  notes he is high risk for EGD/Colonoscopy, but feels reasonable plan prior to undergoing heart cath. 4.  ESRD on HD 5.  Acute on chronic diastolic heart failure 6.  Norovirus induced diarrhea: C. difficile negative.  GI path panel positive for norovirus 7.  Diabetes type 2 8.  History of CVA   Plan:    -- Continue daily CBC and transfuse as needed to maintain HGB > 7  -- Will plan for VCE tomorrow.  Will plan to prep the patient to maximize stability to be able to visualize his GI tract. -- Patient declines EGD and colonoscopy. -- Cardiology is planning for further outpatient evaluation for his NSTEMI  Rosario JAYSON Kidney MD 12/01/2023, 11:46 AM

## 2023-12-01 NOTE — Consult Note (Signed)
 Consultation Note Date: 12/01/2023   Patient Name: [name] OZANICH Sr.  DOB: March 03, 1961  MRN: [id number]  Age / Sex: 63 y.o., male  PCP: Pcp, No Referring Physician: Arlice Reichert, MD  Reason for Consultation: Establishing goals of care  HPI/Patient Profile: 63 y.o. male   admitted on 11/08/2023 with   PMH significant for ESRD-HD-MWF/ 10 years, DM2, HTN, nonischemic cardiomyopathy, mild systolic CHF, prior right BKA  12/22, patient was brought to the ED after experiencing an episode of chest pain, hypotension and near syncope during hemodialysis.    Assessment : 1.  GI bleed: Hemoglobin 16.2--> 14.2 overnight, no prior colonoscopy, maroon stool on exam today, Hemoccult positive on 11/20/2023, unfortunately no documentation of bloody stools, nurse today has only been with him for today and notes maroon loose stool 2.  Acute metabolic encephalopathy: initial concern for encephalitis versus meningitis, workup so far unrevealing, neurology recommending continued supportive care with anticipation of patient will have a prolonged recovery encephalopathy related to hypoglycemia mental status improving 3.  NSTEMI: Cardiology initially consulted, troponin elevation with a peak of 8942, echo with LVEF 30-35% and regional wall motion abnormalities, cardiology now reconsulted since mental status has improved 4.  ESRD on HD 5.  Acute on chronic diastolic heart failure 6.  Diarrhea: C. difficile negative, GI path panel being sent today 7.  Diabetes type 2 8.  History of CVA  Projected complicate, hospital stay.  Today is day 29   Patient and his family face treatment option decisions, advanced directive decisions and anticipatory care need    Clinical Assessment and Goals of Care:  This NP Ronal Plants reviewed medical records, received report from team, assessed the patient and then meet at the patient's bedside  to  discuss diagnosis, prognosis, GOC, EOL wishes disposition and options.  No family at bedside   Concept of Palliative Care was introduced as specialized medical care for people and their families living with serious illness.  If focuses on providing relief from the symptoms and stress of a serious illness.  The goal is to improve quality of life for both the patient and the family.    Values and goals of care important to patient and family were attempted to be elicited.   Initially spoke to patient at bedside.  Education offered on the seriousness of his medical situation.  A  discussion was had today regarding advanced directives.  Concepts specific to code status, artifical feeding and hydration, continued IV antibiotics and rehospitalization was had.   Education offered on the concept of adult failure to thrive within the context of multiple serious life limiting illnesses.  Patient is nonambulatory, poor oral intake, skin breakdown, high risk for infection.   The difference between a aggressive medical intervention path  and a palliative comfort care path for this patient at this time was had.  MOST form introduced           I was then able to speak to his daughter/H POA/[name] [name] telephone.  Created space and opportunity  for her to share thoughts and feelings regarding her father's current medical situation.  She communicates a complicated medical/psych social situation.  She speaks her father's poor decisions over a lifetime.  She has tried to be a support for her father and help him with healthcare decisions however time and time again he has done what ever it is he wants to do against our advice.     For now plan is that once patient is medically stable, discharge to skilled nursing facility.  Patient is open to all offered and available medical interventions to prolong life.   Encouraged patient to completed MOST from before leaving the hospital.   Questions and concerns addressed.   Patient and family encouraged to call with questions or concerns.     PMT will continue to support holistically.  I left information for both patient and family, urged them to call with questions or concerns    HCPOA/next of kin/ daughter    SUMMARY OF RECOMMENDATIONS    Code Status/Advance Care Planning: Full code Educated patient/family to consider DNR/DNI status understanding evidenced based poor outcomes in similar hospitalized patient, as the cause of arrest is likely associated with advanced chronic illness rather than an easily reversible acute cardio-pulmonary event.    Palliative Prophylaxis:  Bowel Regimen, Delirium Protocol, and Frequent Pain Assessment  Additional Recommendations (Limitations, Scope, Preferences): Full Scope Treatment  Psycho-social/Spiritual:  Desire for further Chaplaincy support:no Additional Recommendations: Education on Hospice  Prognosis:  Unable to determine  Discharge Planning: To Be Determined      Primary Diagnoses: Present on Admission:  NSTEMI (non-ST elevated myocardial infarction) (HCC)  Acute on chronic systolic CHF (congestive heart failure) (HCC)  HTN (hypertension)  Acute metabolic encephalopathy  Protein-calorie malnutrition, mild (HCC)  Obesity (BMI 30-39.9)  Sepsis with encephalopathy and septic shock (HCC)  Hypotension   I have reviewed the medical record, interviewed the patient and family, and examined the patient. The following aspects are pertinent.  Past Medical History:  Diagnosis Date   ESRD on hemodialysis (HCC)    a. HD MWF > 10 years.   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 01/2022 Echo: EF 45-50%, glob HK, mod LVH, mildly reduced RV fxn, mild MR, mod-sev MV Ca2+. Mild AS; b. 10/2023 Echo: EF 45-50%, glob HK, GrII DD, mildly reduced RV fxn, RVSP 52.48mmHg. Mild MR/MS, mod TR.   Hypertension    Nonischemic cardiomyopathy (HCC)    a. 2019 Cath [facility]): Nonobstructive disease;  b. 12/2019 Echo: EF 50%; c. 12/2019 MV (Tx w/u [facility]): Ant infarct w/ peri-infarct ischemia. EF 35%; c. 05/2021 MV (Tx w/u [facility]): Cor Ca2+, EF 53%, motion artifact, equiv study, likely low risk.   Nonobstructive coronary artery disease    a.  2019 Cath ([facility]): Left main normal, LAD 30 proximal, RCA 30 distal, EF 25%.   Obesity    Type 2 diabetes mellitus (HCC)    a. 10/2023 - admission w/ HHS.   Social History   Socioeconomic History   Marital status: Divorced    Spouse name: Not on file   Number of children: Not on file   Years of education: Not on file   Highest education level: Not on file  Occupational History   Not on file  Tobacco Use   Smoking status: Never   Smokeless tobacco: Never  Vaping Use   Vaping status: Never Used  Substance and Sexual Activity   Alcohol use: Not Currently  Drug use: Never   Sexual activity: Not on file  Other Topics Concern   Not on file  Social History Narrative   Lives locally.  Does not routinely exercise.   Social Drivers of Corporate Investment Banker Strain: Not on file  Food Insecurity: Patient Unable To Answer (11/10/2023)   Hunger Vital Sign    Worried About Running Out of Food in the Last Year: Patient unable to answer    Ran Out of Food in the Last Year: Patient unable to answer  Transportation Needs: Unknown (11/10/2023)   PRAPARE - Transportation    Lack of Transportation (Medical): Patient unable to answer    Lack of Transportation (Non-Medical): No  Physical Activity: Not on file  Stress: Not on file  Social Connections: Not on file   Family History  Problem Relation Age of Onset   Hypertension Mother    Scheduled Meds:  atorvastatin   40 mg Oral Daily   Chlorhexidine  Gluconate Cloth  6 each Topical Q0600   cinacalcet   30 mg Oral Q breakfast   darbepoetin (ARANESP ) injection - DIALYSIS  100 mcg Subcutaneous Q Mon-1800   doxercalciferol   3 mcg Intravenous Q M,W,F-HD   feeding supplement  237 mL Oral  BID BM   insulin  aspart  0-5 Units Subcutaneous QHS   insulin  aspart  0-9 Units Subcutaneous TID WC   insulin  glargine-yfgn  25 Units Subcutaneous Daily   midodrine   10 mg Oral TID WC   multivitamin  1 tablet Oral QHS   pantoprazole  (PROTONIX ) IV  40 mg Intravenous BID   sevelamer  carbonate  4,000 mg Oral TID WC   thiamine   100 mg Oral Daily   Continuous Infusions: PRN Meds:.acetaminophen  **OR** acetaminophen , iohexol , ondansetron  **OR** ondansetron  (ZOFRAN ) IV, mouth rinse, sevelamer  carbonate Medications Prior to Admission:  Prior to Admission medications   Medication Sig Start Date End Date Taking? Authorizing Provider  acetaminophen  (TYLENOL ) 500 MG tablet Take 1,000 mg by mouth 2 (two) times daily as needed for moderate pain (pain score 4-6), headache or fever.   Yes [provider]  B Complex-C-Folic Acid  (DIALYVITE 800) 0.8 MG TABS Take 1 tablet by mouth daily.   Yes [provider]  cinacalcet  (SENSIPAR ) 30 MG tablet Take 30 mg by mouth daily.   Yes [provider]  insulin  isophane & regular human KwikPen (NOVOLIN  70/30 KWIKPEN) (70-30) 100 UNIT/ML KwikPen Inject 30U in the morning and 25U in the evening 10/27/23  Yes Ezenduka, Nkeiruka J, MD  lidocaine -prilocaine  (EMLA ) cream Apply 1 Application topically Every Tuesday,Thursday,and Saturday with dialysis.   Yes [provider]  sevelamer  carbonate (RENVELA ) 800 MG tablet Take 2,400-4,000 mg by mouth See admin instructions. Take 5 tablets (4000mg ) three times daily with meals and 3 tablets (2400mg ) twice daily with snacks.   Yes [provider]  Insulin  Pen Needle 32G X 4 MM MISC Use with insulin  pen. 10/28/23   Donnamarie Lebron PARAS, MD   Allergies  Allergen Reactions   Sulfa Antibiotics Diarrhea and Nausea And Vomiting   Review of Systems  Neurological:  Positive for weakness.    Physical Exam Constitutional:      Appearance: He is overweight. He is ill-appearing.   Cardiovascular:     Rate and Rhythm: Normal rate.  Pulmonary:     Effort: Pulmonary effort is normal.  Skin:    General: Skin is warm and dry.  Neurological:     Mental Status: He is alert and oriented to person, place, and  time.     Vital Signs: BP 130/77 (BP Location: Right Arm)   Pulse 88   Temp 98.7 F (37.1 C) (Oral)   Resp (!) 21   Ht 6' 1 (1.854 m)   Wt 100.8 kg   SpO2 97%   BMI 29.32 kg/m  Pain Scale: 0-10 POSS *See Group Information*: 1-Acceptable,Awake and alert Pain Score: 0-No pain   SpO2: SpO2: 97 % O2 Device:SpO2: 97 % O2 Flow Rate: .O2 Flow Rate (L/min): 1 L/min  IO: Intake/output summary:  Intake/Output Summary (Last 24 hours) at 12/01/2023 0945 Last data filed at 12/01/2023 0900 Gross per 24 hour  Intake 420 ml  Output 2700 ml  Net -2280 ml    LBM: Last BM Date : 11/30/23 Baseline Weight: Weight: 113.4 kg Most recent weight: Weight: 100.8 kg     Palliative Assessment/Data:  40 %      Time: 90 minutes   Signed by: Ronal Plants, NP   Please contact Palliative Medicine Team phone at [phone] for questions and concerns.  For individual provider: See Tracey

## 2023-12-01 NOTE — Progress Notes (Signed)
 PROGRESS NOTE  [name] NIU Sr.  DOB: 1961/10/17  PCP: [name] [name] FMW:[id number]  DOA: 11/08/2023  LOS: 23 days  Hospital Day: 24  Brief narrative: [name] [name] is a 63 y.o. male with PMH significant for ESRD-HD-MWF, DM2, HTN, nonischemic cardiomyopathy, mild systolic CHF, prior right BKA 87/77, patient was brought to the ED after experiencing an episode of chest pain, hypotension and near syncope during hemodialysis.  His initial blood pressure was low in 75/59 On further workup in the ED, troponin was significantly elevated and peaked at 9000 He was diagnosed with NSTEMI, started on heparin  drip. Admitted to [facility]  Seen by cardiology  See below for details  Hospital course was also complicated altered mental status, influenza, GI bleeding, norovirus diarrhea Patient is currently weak and unable to be in a chair for dialysis. Palliative care consulted.  Subjective: Patient was seen and examined this morning. Sleeping, opens eyes on command.  Says he had soft stool 1 episode last night without blood.  Assessment and plan: Acute GI bleeding  Norovirus diarrhea Patient had several days of GI bleeding which worsened with DAPT.  GI consulted.  Bleeding suspected to be combination of norovirus diarrhea, ischemic colitis.  May also have AVM related to moderate to severe AS.  Because of cardiac comorbidity, colonoscopy was not rushed.  DAPT stopped.  Bleeding seems to be improving.  Hemoglobin dropped but has stabilized now between 9 and 10. Recent Labs    12/17/22 1244 12/17/22 1245 10/22/23 2343 11/16/23 1518 11/17/23 0245 11/24/23 1306 11/25/23 1108 11/27/23 0435 11/27/23 1319 11/27/23 1924 11/30/23 0249 11/30/23 1351 12/01/23 0246  HGB  --   --    < >  --    < >  --    < > 12.0* 11.4* 11.6* 9.4*  --  9.8*  MCV  --   --    < >  --    < >  --    < >  --   --   --  87.8  --  89.4  VITAMINB12  --   --   --  2,058*  --   --   --   --   --   --   --   --   --   FOLATE  --    --   --  16.6  --   --   --   --   --   --   --   --   --   FERRITIN 1,481*  --   --   --   --  421*  --   --   --   --   --  342*  --   TIBC  --  190*  --   --   --  294  --   --   --   --   --  322  --   IRON   --  72  --   --   --  46  --   --   --   --   --  30*  --    < > = values in this interval not displayed.   NSTEMI Presented with chest pain, hypertension, syncope, significantly elevated troponin  12/23 echocardiogram showed EF low at 30 to 35% with regional WMA, moderate to severe aortic stenosis Initially started on medical management with IV heparin  drip, aspirin  and Lipitor Cardiology thought of cardiac cath but because of altered mental  status and GI bleeding, plan was held.  He was started on DAPT to check if he could tolerate it after his stent.  After single dose of DAPT, patient had profuse rectal bleeding and hence DAPT was stopped.   Currently on statin only  Acute exacerbation of combined systolic and diastolic CHF Moderate to severe aortic stenosis Echo from 10/1811/23 compared.  Noted a drop in EF from 45-50% to 30-35%, along with grade 3 diastolic dysfunction as well as moderate to severe aortic valve stenosis. Cardiology following Currently on midodrine  10 mg 3 times daily.  Metoprolol  held because of low blood pressure. Also on dialysis for fluid management  ESRD-HD-MWF Nephrology on board Patient needs to be able to tolerate dialysis in chair to consider outpatient dialysis.  Influenza A positive Detected on 12/30.  Completed the course of Tamiflu .  Acute metabolic encephalopathy Patient hospital stay was complicated by altered mental status.  There was initial concern for encephalitis vs meningitis. Neurology was consulted.  Patient while started empirically on broad-spectrum antibiotics, antiviral. 12/27 and 1/1, EEG did not show any evidence of seizure activity. 1/3, MRI brain did not any acute abnormality. 1/3, lumbar puncture done. CSF labs significant  for elevated glucose and protein, not consistent with meningitis; Meningitis/encephalitis panel negative for pathogen. Antibiotics were stopped. Neurology recommendation for continued supportive care with anticipation that patient will have a prolonged recovery of encephalopathy related to hyperglycemia.  ID and neurology signed off. Mental status gradually improved  Type 2 diabetes mellitus uncontrolled A1c more than 15.51 10/24/2023 Currently on Semglee  25 units daily SSI/Accu-Cheks.  Blood sugar level controlled.  Continue to monitor Recent Labs  Lab 11/30/23 1312 11/30/23 1622 11/30/23 2051 12/01/23 0615 12/01/23 1131  GLUCAP 87 154* 191* 148* 219*   H/o CVA Continue Lipitor   Mobility: PT eval obtained.  SNF recommended  Goals of care   Code Status: Full Code.  Palliative care consulted today.    DVT prophylaxis:  Place and maintain sequential compression device Start: 11/20/23 0706 SCDs Start: 11/09/23 1558   Antimicrobials: Completed a course Fluid: None Consultants: GI, cardiology, nephrology Family Communication: Family not at bedside  Status: Inpatient Level of care:  Progressive   Patient is from: Home Needs to continue in-hospital care: GI bleeding seems to be improving.  Needs to tolerate dialysis in chair prior to discharge to SNF Anticipated d/c to: SNF  Diet:  Diet Order             Diet NPO time specified  Diet effective midnight           DIET SOFT Fluid consistency: Thin; Fluid restriction: 2000 mL Fluid  Diet effective now                   Scheduled Meds:  atorvastatin   40 mg Oral Daily   Chlorhexidine  Gluconate Cloth  6 each Topical Q0600   cinacalcet   30 mg Oral Q breakfast   darbepoetin (ARANESP ) injection - DIALYSIS  100 mcg Subcutaneous Q Mon-1800   doxercalciferol   3 mcg Intravenous Q M,W,F-HD   feeding supplement  237 mL Oral BID BM   insulin  aspart  0-5 Units Subcutaneous QHS   insulin  aspart  0-9 Units Subcutaneous TID WC    insulin  glargine-yfgn  25 Units Subcutaneous Daily   midodrine   10 mg Oral TID WC   multivitamin  1 tablet Oral QHS   pantoprazole  (PROTONIX ) IV  40 mg Intravenous BID   peg 3350  powder  0.5 kit  Oral Once   sevelamer  carbonate  4,000 mg Oral TID WC   thiamine   100 mg Oral Daily    PRN meds: acetaminophen  **OR** acetaminophen , iohexol , ondansetron  **OR** ondansetron  (ZOFRAN ) IV, mouth rinse, sevelamer  carbonate   Infusions:   iron  sucrose      Antimicrobials: Anti-infectives (From admission, onward)    Start     Dose/Rate Route Frequency Ordered Stop   11/18/23 1800  oseltamivir  (TAMIFLU ) capsule 30 mg        30 mg Oral Every M-W-F (Hemodialysis) 11/18/23 1116 11/23/23 1159   11/17/23 1800  oseltamivir  (TAMIFLU ) capsule 30 mg  Status:  Discontinued        30 mg Oral Every T-Th-Sa (1800) 11/17/23 0800 11/18/23 1116   11/17/23 1115  vancomycin  (VANCOCIN ) IVPB 1000 mg/200 mL premix  Status:  Discontinued        1,000 mg 200 mL/hr over 60 Minutes Intravenous Once in dialysis 11/17/23 1021 11/18/23 1331   11/17/23 1000  oseltamivir  (TAMIFLU ) capsule 30 mg        30 mg Oral  Once 11/17/23 0759 11/17/23 0952   11/16/23 1200  vancomycin  (VANCOCIN ) IVPB 1000 mg/200 mL premix  Status:  Discontinued        1,000 mg 200 mL/hr over 60 Minutes Intravenous Every M-W-F (Hemodialysis) 11/15/23 1658 11/18/23 1306   11/12/23 2300  vancomycin  (VANCOCIN ) IVPB 1000 mg/200 mL premix        1,000 mg 200 mL/hr over 60 Minutes Intravenous  Once 11/12/23 2226 11/13/23 0131   11/12/23 2000  cefTRIAXone  (ROCEPHIN ) 2 g in sodium chloride  0.9 % 100 mL IVPB  Status:  Discontinued        2 g 200 mL/hr over 30 Minutes Intravenous Every 12 hours 11/12/23 1627 11/18/23 1306   11/12/23 2000  ampicillin  (OMNIPEN) 2 g in sodium chloride  0.9 % 100 mL IVPB  Status:  Discontinued        2 g 300 mL/hr over 20 Minutes Intravenous Every 12 hours 11/12/23 1627 11/18/23 1306   11/12/23 1730  acyclovir  (ZOVIRAX ) 450 mg  in dextrose  5 % 100 mL IVPB  Status:  Discontinued        5 mg/kg  89.6 kg (Adjusted) 109 mL/hr over 60 Minutes Intravenous Every 24 hours 11/12/23 1627 11/18/23 1306   11/11/23 1915  piperacillin -tazobactam (ZOSYN ) IVPB 2.25 g  Status:  Discontinued        2.25 g 100 mL/hr over 30 Minutes Intravenous Every 8 hours 11/11/23 1825 11/12/23 1615   11/11/23 1245  metroNIDAZOLE  (FLAGYL ) IVPB 500 mg  Status:  Discontinued        500 mg 100 mL/hr over 60 Minutes Intravenous Every 12 hours 11/11/23 1146 11/11/23 1825   11/11/23 1200  vancomycin  (VANCOCIN ) IVPB 1000 mg/200 mL premix  Status:  Discontinued        1,000 mg 200 mL/hr over 60 Minutes Intravenous Every M-W-F (Hemodialysis) 11/10/23 1458 11/15/23 1658   11/10/23 1545  vancomycin  (VANCOCIN ) IVPB 1000 mg/200 mL premix       Placed in Followed by Linked Group   1,000 mg 200 mL/hr over 60 Minutes Intravenous  Once 11/10/23 1458 11/10/23 1917   11/10/23 1545  vancomycin  (VANCOCIN ) IVPB 1000 mg/200 mL premix       Placed in Followed by Linked Group   1,000 mg 200 mL/hr over 60 Minutes Intravenous  Once 11/10/23 1458 11/10/23 1815   11/10/23 1545  ceFEPIme  (MAXIPIME ) 1 g in sodium chloride  0.9 % 100 mL  IVPB  Status:  Discontinued        1 g 200 mL/hr over 30 Minutes Intravenous Every M-W-F 11/10/23 1458 11/10/23 1520   11/10/23 1545  ceFEPIme  (MAXIPIME ) 2 g in sodium chloride  0.9 % 100 mL IVPB  Status:  Discontinued        2 g 200 mL/hr over 30 Minutes Intravenous Every M-W-F 11/10/23 1520 11/11/23 1810       Objective: Vitals:   12/01/23 0542 12/01/23 0746  BP: 110/63 130/77  Pulse: 82 88  Resp: 20 (!) 21  Temp: 98.4 F (36.9 C) 98.7 F (37.1 C)  SpO2: 97% 97%    Intake/Output Summary (Last 24 hours) at 12/01/2023 1137 Last data filed at 12/01/2023 0900 Gross per 24 hour  Intake 420 ml  Output 2700 ml  Net -2280 ml   Filed Weights   11/30/23 0810 11/30/23 1231 12/01/23 0500  Weight: 103 kg 100.3 kg 100.8 kg    Weight change: 0.3 kg Body mass index is 29.32 kg/m.   Physical Exam: General exam: Pleasant, middle-aged African-American male. Not in distress.  Skin: No rashes, lesions or ulcers. HEENT: Atraumatic, normocephalic, no obvious bleeding Lungs: Clear to auscultation bilaterally CVS: S1, S2, no murmur. GI/Abd: Soft, nontender, nondistended, bowel sound present CNS: Somnolent.  Opens eyes on command. Psychiatry: Mood appropriate. Extremities: No pedal edema, no calf tenderness.  Data Review: I have personally reviewed the laboratory data and studies available.  F/u labs  Unresulted Labs (From admission, onward)     Start     Ordered   11/19/23 1134  Oligoclonal bands, CSF + serum  (Oligoclonal Bands, CSF + Serum panel)  Once,   R        11/19/23 1134   Unscheduled  Occult blood card to lab, stool  As needed,   R      11/19/23 1729   Signed and Held  Renal function panel  Once,   R       Question:  Specimen collection method  Answer:  Lab=Lab collect   Signed and Held   Signed and Held  CBC  Once,   R       Question:  Specimen collection method  Answer:  Lab=Lab collect   Signed and Held            Total time spent in review of labs and imaging, patient evaluation, formulation of plan, documentation and communication with family: 45 minutes  Signed, Chapman Rota, MD [facility] 12/01/2023

## 2023-12-01 NOTE — Progress Notes (Addendum)
 Physical Therapy Treatment Patient Details Name: [name] WENDLING Sr. MRN: [id number] DOB: 1960/12/09 Today's Date: 12/01/2023   History of Present Illness Pt is 63 year old presented to [facility] on  11/08/23 after experiencing a near syncope during HD. Pt being worked up for an NSTEMI pt unable to tolerate medication and no longer plan to perform cardiac cath, and fever with acute metabolic encephalopathy.  Pt recently admitted 12/6-12/10/24 for a fall. PMH - htn, cardiomyopathy, CHF, dm, esrd on hd, R BKA, CVA    PT Comments  Pt admitted with above diagnosis. Pt with limited participation today due to signficant left hand pain limiting his ability to use the left hand as well as pt with dizziness with soft BP as well.  BP once EOB 92/56 with pt complaining of dizziness.  Once in chair, pts BP was 105/57 with legs reclined.  Notified nurse of left uppper arm oozing and placed dressing on it as pt had removed prior dressing and thrown it on floor prior to PT arrival.  Was able to transfer pt to recliner drop arm with max assist of 2 persons with use of pad.  CM asking if pt can sit up 4 hours to tolerate outpt HD therefore asked nursing to allow pt to sit up until 330 pm if pt is able.  Pt met 0/4 goals due to medical issues and pain as well as HD.  Revised goals today.  Will continue PT.   Pt currently with functional limitations due to the deficits listed below (see PT Problem List). Pt will benefit from acute skilled PT to increase their independence and safety with mobility to allow discharge.       If plan is discharge home, recommend the following: Two people to help with walking and/or transfers;Assistance with cooking/housework;Assist for transportation;Help with stairs or ramp for entrance;Supervision due to cognitive status   Can travel by private vehicle        Equipment Recommendations  Other (comment);Hoyer lift;Wheelchair (measurements PT);Wheelchair cushion (measurements PT);Hospital bed  (defer to post acute)    Recommendations for Other Services       Precautions / Restrictions Precautions Precautions: Fall Precaution Comments: DROPLET - flu and Norovirus precatuions Required Braces or Orthoses: Other Brace Other Brace: R prosthetic (does not fit and pt states he didnt use it for transfers) Restrictions Weight Bearing Restrictions Per Provider Order: No RLE Weight Bearing Per Provider Order: Non weight bearing Other Position/Activity Restrictions: R BKA     Mobility  Bed Mobility Overal bed mobility: Needs Assistance Bed Mobility: Rolling, Sidelying to Sit, Sit to Sidelying Rolling: Used rails, Mod assist, +2 for physical assistance Sidelying to sit: Used rails, Max assist, +2 for physical assistance       General bed mobility comments: pt continues with better activation of RLE than L. Pt was able to assist using rails this session with intermittent verbal cues for sequencing in order to decrease physical assist required for mobility. Pt took incr time to get to EOB needing heavy +2 assist and took incr time for pt to get balance once up as pt with decr use of left hand due to c/o pain.  Pt was able to sit EOB for ~3 min and then stated he was dizzy and wanted to lie back down.  PT let pt rest a minute and then assisteed back to sitting.  BP was soft but felt pt needed to get to recliner.    Transfers Overall transfer level: Needs assistance  Lateral/Scoot Transfers: Max assist, +2 physical assistance, From elevated surface General transfer comment: Pt scooted to recliner with max assist of 2 persons with use of pad. Pt having difficulty assisting as his left hand was so painful.  Spoke with nurse and mobility to use MAximove to get pt back to bed.    Ambulation/Gait Ambulation/Gait assistance:  (unable to ambulate at this time due to impaired prosthetic fit)             General Gait Details: unable   Stairs              Wheelchair Mobility     Tilt Bed    Modified Rankin (Stroke Patients Only)       Balance Overall balance assessment: Needs assistance Sitting-balance support: Feet supported, Bilateral upper extremity supported, Single extremity supported Sitting balance-Leahy Scale: Fair Sitting balance - Comments: Once in sitting pt was able to sit with min assist Postural control: Left lateral lean, Posterior lean Standing balance support: Bilateral upper extremity supported, Reliant on assistive device for balance, During functional activity Standing balance-Leahy Scale: Poor Standing balance comment: unable to get to standing.                            Cognition Arousal: Alert Behavior During Therapy: WFL for tasks assessed/performed Overall Cognitive Status: Impaired/Different from baseline Area of Impairment: Memory, Following commands, Safety/judgement, Awareness, Problem solving                 Orientation Level: Disoriented to, Situation, Time Current Attention Level: Sustained Memory: Decreased short-term memory Following Commands: Follows one step commands with increased time, Follows one step commands consistently Safety/Judgement: Decreased awareness of safety, Decreased awareness of deficits Awareness: Intellectual Problem Solving: Slow processing, Decreased initiation, Requires verbal cues, Difficulty sequencing General Comments: Pt asked  PT to scratch his back once on EOB.        Exercises Amputee Exercises Quad Sets: AROM, Both, 5 reps Hip Flexion/Marching: AROM, Both, 10 reps, Seated Knee Extension: AROM, Both, 5 reps, Seated    General Comments General comments (skin integrity, edema, etc.): Notified nurse that pts left upper arm was oozing blood.  Noted that pt had taken off bandages as they were laying on floor on arrival to room.      Pertinent Vitals/Pain Pain Assessment Pain Assessment: Faces Faces Pain Scale: Hurts  worst Breathing: normal Negative Vocalization: none Facial Expression: smiling or inexpressive Body Language: relaxed Consolability: no need to console PAINAD Score: 0 Pain Location: left hand Pain Descriptors / Indicators: Guarding, Grimacing, Discomfort Pain Intervention(s): Limited activity within patient's tolerance, Monitored during session, Repositioned    Home Living                          Prior Function            PT Goals (current goals can now be found in the care plan section) Acute Rehab PT Goals PT Goal Formulation: Patient unable to participate in goal setting Time For Goal Achievement: 12/15/23 Potential to Achieve Goals: Fair Progress towards PT goals: Not progressing toward goals - comment (left hand pain and dizziness limiting function)    Frequency    Min 1X/week      PT Plan      Co-evaluation              AM-PAC PT 6 Clicks Mobility   Outcome Measure  Help needed turning from your back to your side while in a flat bed without using bedrails?: A Little Help needed moving from lying on your back to sitting on the side of a flat bed without using bedrails?: Total Help needed moving to and from a bed to a chair (including a wheelchair)?: Total Help needed standing up from a chair using your arms (e.g., wheelchair or bedside chair)?: Total Help needed to walk in hospital room?: Total Help needed climbing 3-5 steps with a railing? : Total 6 Click Score: 8    End of Session Equipment Utilized During Treatment: Gait belt Activity Tolerance: Patient limited by fatigue;Patient limited by pain Patient left: in chair;with call bell/phone within reach;with chair alarm set Nurse Communication: Need for lift equipment;Mobility status (maximove recommended) PT Visit Diagnosis: Muscle weakness (generalized) (M62.81);Difficulty in walking, not elsewhere classified (R26.2);Pain Pain - Right/Left: Left Pain - part of body: Hand     Time:  8963-8897 PT Time Calculation (min) (ACUTE ONLY): 26 min  Charges:    $Therapeutic Activity: 23-37 mins PT General Charges $$ ACUTE PT VISIT: 1 Visit          Briceson Broadwater M,PT Acute Rehab Services [phone]    [name] 12/01/2023, 2:27 PM

## 2023-12-01 NOTE — Progress Notes (Signed)
 Mobility Specialist Progress Note:    12/01/23 1616  Mobility  Activity Transferred from bed to chair  Level of Assistance Maximum assist, patient does 25-49% (+3)  Assistive Device Stedy  RLE Weight Bearing Per Provider Order NWB  Activity Response Tolerated poorly  Mobility Referral Yes  Mobility visit 1 Mobility  Mobility Specialist Start Time (ACUTE ONLY) 1545  Mobility Specialist Stop Time (ACUTE ONLY) 1605  Mobility Specialist Time Calculation (min) (ACUTE ONLY) 20 min   Pt received in chair, nursing team requesting assistance getting patient back to bed. Attempted to stand x3 w/ the stedy and MaxA+2 before needing MaxA+3. Pt required max verbal cues to stay focused, had a hard time following commands. Returned to bed. Pt able to swing legs over independently. Call bell and personal belongings in reach. All needs met. NT in room.   Thersia Minder Mobility Specialist  Please contact vis Secure Chat or  Rehab Office [phone]

## 2023-12-01 NOTE — Progress Notes (Signed)
 Nutrition Follow-up  DOCUMENTATION CODES:   Not applicable  INTERVENTION:  -Soft diet, 2000 mL physician ordered fluid restriction. -Continue Ensure Enlive po BID, each supplement provides 350 kcal and 20 grams of protein. -Magic cup TID with meals, each supplement provides 290 kcal and 9 grams of protein -Renal-MVI-1 Tab daily.   NUTRITION DIAGNOSIS:   Inadequate oral intake related to acute illness as evidenced by other (comment), energy intake < 75% for > 7 days (full liquid diet).  -ongoing, addressing with oral supplements and diet  GOAL:   Patient will meet greater than or equal to 90% of their needs  -progressing  MONITOR:   PO intake, Labs, Supplement acceptance, Weight trends, Skin  REASON FOR ASSESSMENT:   Follow up  ASSESSMENT:   63y.o. male with PMH: ESRD on HD, HF, T2DM, s/p RBKA and obesity. Experienced syncope/SOB during HD. Recently hospitalized from 12/06-12/10 for uncontrolled hyperglycemia. Found to have very high troponin. Dx of NSTEMI.  12/22 Admitted to [facility] 12/23 Echocardiogram, transferred to [facility] 12/25 Cognition declined 12/27 EEG 01/03 Lumbar puncture d/t worsening cognition   NPO at midnight for [name].    Intakes recorded averaged 38% x 8 meals, however today, L meal was 100% consumed, provided 574 calories and 19 gm protein. Patient states his appetite has improved today and he is drinking the Ensure.  Current weight down 28# from admit, likely reflecting fluid, patient is on dialysis, continue to monitor.   Medications reviewed and include cinacalcet , Darbepoetin Alfa , doxercalciferol  3 mcg every M-W-F, novolog  SS 3x daily with meals and at bedtime, insulin  glargine-yfgn 25 units daily, Rena-Vit-1 Tab daily, PPI, Renvela  3x daily with meals, thiamine  100 mg daily, iron  sucrose 200 mg every 24 hrs.  Labs: CBG 87-219 past 24 hrs, 1/13: potassium 5.6, BUN 56, creatinine 11.83, GFR 4  NUTRITION - FOCUSED PHYSICAL  EXAM:  Flowsheet Row Most Recent Value  Orbital Region No depletion  Upper Arm Region No depletion  Thoracic and Lumbar Region No depletion  Buccal Region No depletion  Temple Region No depletion  Clavicle Bone Region No depletion  Clavicle and Acromion Bone Region No depletion  Scapular Bone Region No depletion  Dorsal Hand No depletion  Patellar Region No depletion  Anterior Thigh Region No depletion  Posterior Calf Region No depletion  Edema (RD Assessment) None  Hair Reviewed  Eyes Reviewed  Mouth Reviewed  Skin Reviewed  Nails Reviewed       Diet Order:   Diet Order             Diet NPO time specified  Diet effective midnight           DIET SOFT Fluid consistency: Thin; Fluid restriction: 2000 mL Fluid  Diet effective now                   EDUCATION NEEDS:   Not appropriate for education at this time  Skin:  Skin Assessment: Reviewed RN Assessment  Last BM:  12/01/2023, type 6-medium, continues to experience some black and red stools  Height:   Ht Readings from Last 1 Encounters:  11/08/23 6' 1 (1.854 m)    Weight:   Wt Readings from Last 1 Encounters:  12/01/23 100.8 kg    Ideal Body Weight:  83.6 kg  BMI:  Body mass index is 29.32 kg/m.  Estimated Nutritional Needs:   Kcal:  2000-2200kcal  Protein:  100-110g protein  Fluid:  1.5-2L    Elveria Sable, RDLD Clinical  Dietitian If unable to reach, please contact RD Inpatient secure chat group between 8 am-4 pm daily

## 2023-12-02 ENCOUNTER — Encounter (HOSPITAL_COMMUNITY)
Admission: EM | Disposition: A | Discharge status: Skilled Nursing Facility | Payer: Self-pay | Source: Home / Self Care | Attending: Internal Medicine

## 2023-12-02 ENCOUNTER — Encounter (HOSPITAL_COMMUNITY): Payer: Self-pay | Admitting: Internal Medicine

## 2023-12-02 DIAGNOSIS — I214 Non-ST elevation (NSTEMI) myocardial infarction: Secondary | ICD-10-CM | POA: Diagnosis not present

## 2023-12-02 DIAGNOSIS — N186 End stage renal disease: Secondary | ICD-10-CM | POA: Diagnosis not present

## 2023-12-02 DIAGNOSIS — G9341 Metabolic encephalopathy: Secondary | ICD-10-CM | POA: Diagnosis not present

## 2023-12-02 DIAGNOSIS — I9589 Other hypotension: Secondary | ICD-10-CM | POA: Diagnosis not present

## 2023-12-02 HISTORY — PX: GIVENS CAPSULE STUDY: SHX5432

## 2023-12-02 LAB — COMPREHENSIVE METABOLIC PANEL
ALT: 20 U/L (ref 0–44)
AST: 27 U/L (ref 15–41)
Albumin: 2.9 g/dL — ABNORMAL LOW (ref 3.5–5.0)
Alkaline Phosphatase: 90 U/L (ref 38–126)
Anion gap: 13 (ref 5–15)
BUN: 40 mg/dL — ABNORMAL HIGH (ref 8–23)
CO2: 25 mmol/L (ref 22–32)
Calcium: 9.5 mg/dL (ref 8.9–10.3)
Chloride: 94 mmol/L — ABNORMAL LOW (ref 98–111)
Creatinine, Ser: 11.57 mg/dL — ABNORMAL HIGH (ref 0.61–1.24)
GFR, Estimated: 5 mL/min — ABNORMAL LOW (ref >60)
Glucose, Bld: 124 mg/dL — ABNORMAL HIGH (ref 70–99)
Potassium: 5.4 mmol/L — ABNORMAL HIGH (ref 3.5–5.1)
Sodium: 132 mmol/L — ABNORMAL LOW (ref 135–145)
Total Bilirubin: 0.6 mg/dL (ref 0.0–1.2)
Total Protein: 7.2 g/dL (ref 6.5–8.1)

## 2023-12-02 LAB — CBC
HCT: 30.8 % — ABNORMAL LOW (ref 39.0–52.0)
Hemoglobin: 9.9 g/dL — ABNORMAL LOW (ref 13.0–17.0)
MCH: 28.9 pg (ref 26.0–34.0)
MCHC: 32.1 g/dL (ref 30.0–36.0)
MCV: 90.1 fL (ref 80.0–100.0)
Platelets: 258 10*3/uL (ref 150–400)
RBC: 3.42 MIL/uL — ABNORMAL LOW (ref 4.22–5.81)
RDW: 16.2 % — ABNORMAL HIGH (ref 11.5–15.5)
WBC: 6.9 10*3/uL (ref 4.0–10.5)
nRBC: 0 % (ref 0.0–0.2)

## 2023-12-02 LAB — OLIGOCLONAL BANDS, CSF + SERM

## 2023-12-02 LAB — GLUCOSE, CAPILLARY
Glucose-Capillary: 117 mg/dL — ABNORMAL HIGH (ref 70–99)
Glucose-Capillary: 106 mg/dL — ABNORMAL HIGH (ref 70–99)
Glucose-Capillary: 130 mg/dL — ABNORMAL HIGH (ref 70–99)
Glucose-Capillary: 140 mg/dL — ABNORMAL HIGH (ref 70–99)

## 2023-12-02 SURGERY — GIVENS CAPSULE STUDY

## 2023-12-02 NOTE — Progress Notes (Signed)
   12/02/23 1820  Vitals  Pulse Rate 78  Resp 18  BP (!) 99/55  SpO2 98 %   Received patient in bed to unit.  Alert and oriented.  Informed consent signed and in chart.   TX duration:3.0 HOURS  Patient tolerated well.  Transported back to the room  Alert, without acute distress.  Hand-off given to patient's nurse.   Access used:  L AVF Access issues: None  Total UF removed: Medication(s) given: See MAR    Morrie Arbour, RN [facility]

## 2023-12-02 NOTE — Progress Notes (Deleted)
   12/02/23 1123  Vitals  Temp 98.2 F (36.8 C)  Pulse Rate 79  Resp 12  BP (!) 117/57  SpO2 97 %  O2 Device Room Air  Oxygen Therapy  Pulse Oximetry Type Continuous  Post Treatment  Dialyzer Clearance Lightly streaked  Liters Processed 24  Fluid Removed (mL) 500 mL  Tolerated HD Treatment Yes   Received patient in bed to unit.  Alert and oriented.  Informed consent signed and in chart.   TX duration:2.0 hours  Patient tolerated well.  Transported back to the room  Alert, without acute distress.  Hand-off given to patient's nurse.   Access used: cvc   Total UF removed: Medication(s) given: See Tammy Fallen, RN [facility]

## 2023-12-02 NOTE — TOC Progression Note (Addendum)
Transition of Care (TOC) - Progression Note    Patient Details  Name: [name] TIGGES Sr. MRN: [id number] Date of Birth: 1961/03/25  Transition of Care [facility]) CM/SW Contact  Michaela Corner, [place] Phone Number: 12/02/2023, 3:28 PM  Clinical Narrative:  Per daily meeting with treatment team, pt is not medically ready to dc. CSW updated [facility].     Expected Discharge Plan: Skilled Nursing Facility Barriers to Discharge: SNF Pending bed offer, Continued Medical Work up  Expected Discharge Plan and Services       Living arrangements for the past 2 months: Single Family Home                           HH Arranged:  (Active with [facility])           Social Determinants of Health (SDOH) Interventions SDOH Screenings   Food Insecurity: Patient Unable To Answer (11/10/2023)  Housing: Patient Unable To Answer (11/10/2023)  Transportation Needs: Unknown (11/10/2023)  Utilities: Patient Unable To Answer (11/10/2023)  Alcohol Screen: Low Risk  (02/04/2022)  Depression (PHQ2-9): Low Risk  (08/06/2022)  Tobacco Use: Low Risk  (12/02/2023)    Readmission Risk Interventions    11/05/2022    3:08 PM  Readmission Risk Prevention Plan  Transportation Screening Complete  Medication Review (RN Care Manager) Complete  PCP or Specialist appointment within 3-5 days of discharge Complete  HRI or Home Care Consult Complete  SW Recovery Care/Counseling Consult Complete  Palliative Care Screening Complete  Skilled Nursing Facility Complete

## 2023-12-02 NOTE — Progress Notes (Signed)
 OT Cancellation Note  Patient Details Name: [name] FEENEY Sr. MRN: [id number] DOB: 06-Feb-1961   Cancelled Treatment:    Reason Eval/Treat Not Completed: Patient at procedure or test/ unavailable (Messaged RN, pt just transferred to HD appointment. OT will follow-up with pt as able)  12/02/2023  AB, OTR/L  Acute Rehabilitation Services  Office: [phone]   [name] [name] 12/02/2023, 2:23 PM

## 2023-12-02 NOTE — Progress Notes (Signed)
 PT Cancellation Note  Patient Details Name: [name] MOSKWA Sr. MRN: [id number] DOB: 03/25/1961   Cancelled Treatment:    Reason Eval/Treat Not Completed: Patient at procedure or test/unavailable (Pt in HD. Will return as able.)   [name] 12/02/2023, 9:41 AM Braidon Chermak M,PT Acute Rehab Services [phone]

## 2023-12-02 NOTE — Progress Notes (Signed)
 PM RN report that pt has 4 bowel movements, not liquid or clear, however did not even finish half of his bowel prep during the night. Pt as educated and encouraged but noncompliant.

## 2023-12-02 NOTE — Progress Notes (Signed)
 [facility]                                                                              [name] [name], is a 63 y.o. male, DOB - [birth year], ZOX:[id number] Admit date - 11/08/2023    Outpatient Primary MD for the patient is Pcp, No  LOS - 24  days  Chief Complaint  Patient presents with   Hypotension   Cough   Shortness of Breath       Brief summary   Patient is a 63 y.o. male with PMH significant for ESRD-HD-MWF, DM2, HTN, nonischemic cardiomyopathy, mild systolic CHF, prior right BKA.  Patient presented to ED on 12/22 after experiencing an episode of chest pain, hypotension and near syncope during hemodialysis.  His initial blood pressure was low in 75/59 Troponin was significantly elevated and peaked at 9000, he was diagnosed with NSTEMI, started on heparin  drip.  Patient was admitted for further workup.   Hospital course was also complicated altered mental status, influenza, GI bleeding, norovirus diarrhea Patient is currently weak and unable to be in a chair for dialysis. Palliative care consulted.   Assessment & Plan   NSTEMI -Presented with chest pain, hypertension, syncope, significantly elevated troponin  -2D echo 11/09/2023 showed EF 30 -35% with regional WMA, moderate to severe aortic stenosis - Initially started on medical management with IV heparin  drip, aspirin  and Lipitor -Initial consideration of cardiac cath, was placed on trial of DAPT.  However after single dose of Plavix  with aspirin , patient had profuse rectal bleeding, hence DAPT held.  Per cardiology, no plan for inpatient ischemic evaluation.  This can be revisited as outpatient when GI issues stabilized and cleared to utilize DAPT going forward. -Continue atorvastatin   Acute GI bleeding  Norovirus diarrhea -Patient had several days of GI bleeding, worsened with DAPT -  Bleeding suspected to be combination of norovirus diarrhea, ischemic colitis, no prior colonoscopy.  FOBT positive on  1/3, GI pathogen panel positive for norovirus -CTA GI bleed 1/13 showed no active GI bleeding in the imaging  -GI, Dr. Rosaline Coma following, patient was offered EGD, colonoscopy however declined.   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  Currently on midodrine   -Metoprolol  held due to hypotension.   -Cardiology will follow outpatient.  Continue HD for volume management.    ESRD-HD-MWF -Nephrology following -Continue HD per schedule Patient needs to be able to tolerate dialysis in chair to consider outpatient dialysis.   Influenza A positive -RVP positive for flu A + on 12/30.  Completed the course of Tamiflu .   Acute metabolic encephalopathy, likely due to prolonged delirium -Patient hospital stay was complicated by altered mental status.  There was initial concern for encephalitis vs meningitis. Neurology was consulted.  Patient was placed on broad-spectrum antibiotics -EEG 12/27 and 1/1 did not show any evidence of seizure activity. -MRI brain 1/3  negative for acute abnormality  -LP on 1/3,  CSF labs significant for elevated glucose and protein, not consistent with meningitis. Meningitis/encephalitis panel negative for pathogen. -Pulmonology, continue supportive care, improvedlikely due to gradual -ID and neurology signed off. -Mental status improving   Type 2 diabetes mellitus uncontrolled -Hemoglobin A1c > 15.5 on 10/24/2023   CBG (last 3)  Recent Labs    12/01/23 2053 12/02/23 0916 12/02/23 1123  GLUCAP 180* 117* 106*   -Continue Semglee  25 units daily, sensitive sliding scale insulin     H/o CVA Continue Lipitor   Estimated body mass index is 29.17 kg/m as calculated from the  following:   Height as of this encounter: 6\' 1"  (1.854 m).   Weight as of this encounter: 100.3 kg.  Code Status: Full code DVT Prophylaxis:  Place and maintain sequential compression device Start: 11/20/23 0706 SCDs Start: 11/09/23 1558   Level of Care: Level of care: Progressive Family Communication:  Disposition Plan:      Remains inpatient appropriate:      Procedures:    Consultants:   Gastroenterology Cardiology Palliative medicine  Antimicrobials:   Anti-infectives (From admission, onward)    Start     Dose/Rate Route Frequency Ordered Stop   11/18/23 1800  oseltamivir  (TAMIFLU ) capsule 30 mg        30 mg Oral Every M-W-F (Hemodialysis) 11/18/23 1116 11/23/23 1159   11/17/23 1800  oseltamivir  (TAMIFLU ) capsule 30 mg  Status:  Discontinued        30 mg Oral Every T-Th-Sa (1800) 11/17/23 0800 11/18/23 1116   11/17/23 1115  vancomycin  (VANCOCIN ) IVPB 1000 mg/200 mL premix  Status:  Discontinued        1,000 mg 200 mL/hr over 60 Minutes Intravenous Once in dialysis 11/17/23 1021 11/18/23 1331   11/17/23 1000  oseltamivir  (TAMIFLU ) capsule 30 mg        30 mg Oral  Once 11/17/23 0759 11/17/23 0952   11/16/23 1200  vancomycin  (VANCOCIN ) IVPB 1000 mg/200 mL premix  Status:  Discontinued        1,000 mg 200 mL/hr over 60 Minutes Intravenous Every M-W-F (Hemodialysis) 11/15/23 1658 11/18/23 1306   11/12/23 2300  vancomycin  (VANCOCIN ) IVPB 1000 mg/200 mL premix        1,000 mg 200 mL/hr over 60 Minutes Intravenous  Once 11/12/23 2226 11/13/23 0131   11/12/23 2000  cefTRIAXone  (ROCEPHIN ) 2 g in sodium chloride  0.9 % 100 mL IVPB  Status:  Discontinued        2 g 200 mL/hr over 30 Minutes Intravenous Every 12 hours 11/12/23 1627 11/18/23 1306   11/12/23 2000  ampicillin  (OMNIPEN) 2 g in sodium chloride  0.9 % 100 mL IVPB  Status:  Discontinued        2 g 300 mL/hr over 20 Minutes Intravenous Every 12 hours 11/12/23 1627 11/18/23 1306   11/12/23 1730  acyclovir  (ZOVIRAX ) 450 mg  in dextrose  5 % 100 mL IVPB  Status:  Discontinued        5 mg/kg  89.6 kg (Adjusted) 109 mL/hr over 60 Minutes Intravenous Every 24 hours 11/12/23 1627 11/18/23 1306   11/11/23 1915  piperacillin -tazobactam (ZOSYN ) IVPB 2.25 g  Status:  Discontinued        2.25 g 100 mL/hr over 30 Minutes Intravenous Every 8 hours 11/11/23 1825 11/12/23 1615   11/11/23 1245  metroNIDAZOLE  (FLAGYL ) IVPB 500 mg  Status:  Discontinued        500 mg 100 mL/hr over 60 Minutes  Intravenous Every 12 hours 11/11/23 1146 11/11/23 1825   11/11/23 1200  vancomycin  (VANCOCIN ) IVPB 1000 mg/200 mL premix  Status:  Discontinued        1,000 mg 200 mL/hr over 60 Minutes Intravenous Every M-W-F (Hemodialysis) 11/10/23 1458 11/15/23 1658   11/10/23 1545  vancomycin  (VANCOCIN ) IVPB 1000 mg/200 mL premix       Placed in "Followed by" Linked Group   1,000 mg 200 mL/hr over 60 Minutes Intravenous  Once 11/10/23 1458 11/10/23 1917   11/10/23 1545  vancomycin  (VANCOCIN ) IVPB 1000 mg/200 mL premix       Placed in "Followed by" Linked Group   1,000 mg 200 mL/hr over 60 Minutes Intravenous  Once 11/10/23 1458 11/10/23 1815   11/10/23 1545  ceFEPIme  (MAXIPIME ) 1 g in sodium chloride  0.9 % 100 mL IVPB  Status:  Discontinued        1 g 200 mL/hr over 30 Minutes Intravenous Every M-W-F 11/10/23 1458 11/10/23 1520   11/10/23 1545  ceFEPIme  (MAXIPIME ) 2 g in sodium chloride  0.9 % 100 mL IVPB  Status:  Discontinued        2 g 200 mL/hr over 30 Minutes Intravenous Every M-W-F 11/10/23 1520 11/11/23 1810          Medications  atorvastatin   40 mg Oral Daily   Chlorhexidine  Gluconate Cloth  6 each Topical Q0600   cinacalcet   30 mg Oral Q breakfast   darbepoetin (ARANESP ) injection - DIALYSIS  100 mcg Subcutaneous Q Mon-1800   doxercalciferol   3 mcg Intravenous Q M,W,F-HD   feeding supplement  237 mL Oral BID BM   insulin  aspart  0-5 Units Subcutaneous QHS   insulin  aspart  0-9 Units Subcutaneous TID WC   insulin  glargine-yfgn   25 Units Subcutaneous Daily   midodrine   10 mg Oral TID WC   multivitamin  1 tablet Oral QHS   pantoprazole  (PROTONIX ) IV  40 mg Intravenous BID   sevelamer  carbonate  4,000 mg Oral TID WC   thiamine   100 mg Oral Daily      Subjective:   [name] [name] was seen and examined today.  No acute issues overnight.  H&H stable, no active bleeding.  No nausea vomiting, chest pain, acute shortness of breath or abdominal pain.   Objective:   Vitals:   12/02/23 0744 12/02/23 0909 12/02/23 1123 12/02/23 1219  BP: 113/62 (!) 97/51 (!) 117/57   Pulse: 83 81 79   Resp: 14 20 12    Temp: 97.9 F (36.6 C) 97.9 F (36.6 C) 98.2 F (36.8 C)   TempSrc: Oral  Oral   SpO2: 99% 97% 97%   Weight:  100.8 kg  100.3 kg  Height:  6\' 1"  (1.854 m)      Intake/Output Summary (Last 24 hours) at 12/02/2023 1229 Last data filed at 12/02/2023 1123 Gross per 24 hour  Intake 970 ml  Output 500 ml  Net 470 ml     Wt Readings from Last 3 Encounters:  12/02/23 100.3 kg  10/26/23 110.5 kg  10/22/23 108.9 kg     Exam General: Alert and awake, NAD Cardiovascular: S1 S2 auscultated,  RRR Respiratory: CTAB Gastrointestinal: Soft, nontender, nondistended, + bowel sounds Ext: no pedal edema bilaterally Neuro: no new deficits Psych: flat affect    Data Reviewed:  I have personally reviewed following labs    CBC Lab Results  Component Value Date   WBC 7.0 12/01/2023   RBC 3.40 (L) 12/01/2023   HGB 9.8 (  L) 12/01/2023   HCT 30.4 (L) 12/01/2023   MCV 89.4 12/01/2023   MCH 28.8 12/01/2023   PLT 247 12/01/2023   MCHC 32.2 12/01/2023   RDW 15.9 (H) 12/01/2023   LYMPHSABS 1.8 11/30/2023   MONOABS 1.7 (H) 11/30/2023   EOSABS 0.4 11/30/2023   BASOSABS 0.1 11/30/2023     Last metabolic panel Lab Results  Component Value Date   NA 130 (L) 11/30/2023   K 5.6 (H) 11/30/2023   CL 94 (L) 11/30/2023   CO2 24 11/30/2023   BUN 56 (H) 11/30/2023   CREATININE 11.83 (H) 11/30/2023   GLUCOSE 88  11/30/2023   GFRNONAA 4 (L) 11/30/2023   GFRAA 3 (L) 08/14/2019   CALCIUM  9.2 11/30/2023   PHOS 8.0 (H) 11/23/2023   PROT 8.6 (H) 11/17/2023   ALBUMIN  3.1 (L) 11/23/2023   LABGLOB 5.1 (H) 12/17/2022   AGRATIO 0.7 11/20/2022   BILITOT 0.9 11/17/2023   ALKPHOS 78 11/17/2023   AST 27 11/17/2023   ALT 23 11/17/2023   ANIONGAP 12 11/30/2023    CBG (last 3)  Recent Labs    12/01/23 2053 12/02/23 0916 12/02/23 1123  GLUCAP 180* 117* 106*      Coagulation Profile: No results for input(s): "INR", "PROTIME" in the last 168 hours.   Radiology Studies: I have personally reviewed the imaging studies  CT ANGIO GI BLEED Result Date: 11/30/2023 CLINICAL DATA:  Anemia, hematochezia EXAM: CTA ABDOMEN AND PELVIS WITHOUT AND WITH CONTRAST TECHNIQUE: Multidetector CT imaging of the abdomen and pelvis was performed using the standard protocol during bolus administration of intravenous contrast. Multiplanar reconstructed images and MIPs were obtained and reviewed to evaluate the vascular anatomy. RADIATION DOSE REDUCTION: This exam was performed according to the departmental dose-optimization program which includes automated exposure control, adjustment of the mA and/or kV according to patient size and/or use of iterative reconstruction technique. CONTRAST:  OMNIPAQUE  IOHEXOL  350 MG/ML SOLN COMPARISON:  CT scan of the abdomen and pelvis 10/24/2023 FINDINGS: VASCULAR Aorta: No evidence of aneurysm. Scattered calcified plaque throughout the abdominal aorta. No dissection. Celiac: Patent without evidence of aneurysm, dissection, vasculitis or significant stenosis. SMA: Patent without evidence of aneurysm, dissection, vasculitis or significant stenosis. Renals: Calcified atherosclerotic plaque at the origin of both renal arteries. Probable mild bilateral renal artery stenosis. IMA: Patent without evidence of aneurysm, dissection, vasculitis or significant stenosis. Inflow: Patent without evidence of  aneurysm, dissection, vasculitis or significant stenosis. Proximal Outflow: Bilateral common femoral and visualized portions of the superficial and profunda femoral arteries are patent without evidence of aneurysm, dissection, vasculitis or significant stenosis. Veins: No focal venous abnormality. Review of the MIP images confirms the above findings. NON-VASCULAR Lower chest: No acute abnormality.  Cardiomegaly. Hepatobiliary: Normal hepatic contour and morphology. No discrete hepatic lesion. Small stones present within the gallbladder lumen. No biliary ductal dilatation. Pancreas: Unremarkable. No pancreatic ductal dilatation or surrounding inflammatory changes. Spleen: Normal in size without focal abnormality. Adrenals/Urinary Tract: Normal adrenal glands. Bilateral renal atrophy. Multifocal small circumscribed water enhancing cysts consistent with simple cysts. No imaging follow-up is recommended. No hydronephrosis or enhancing renal mass. The ureters are unremarkable. The bladder is decompressed. Stomach/Bowel: No evidence of contrast extravasation to suggest acute GI bleeding. No focal bowel wall thickening or evidence of obstruction. Lymphatic: No suspicious lymphadenopathy. Reproductive: Prostate is unremarkable. Other: No abdominal wall hernia or abnormality. No abdominopelvic ascites. Musculoskeletal: No acute or significant osseous findings. Chronic bilateral L4 pars defects with mild grade 1 anterolisthesis of L4 on  L5. No lytic or blastic osseous lesion. IMPRESSION: 1. No evidence of active GI bleeding at the imaging. 2. No acute abnormality within the abdomen or pelvis. 3. Extensive atherosclerotic vascular calcifications throughout the aorta without aneurysm or dissection. 4. Mild bilateral renal artery stenoses. 5. Cardiomegaly. 6. Cholelithiasis without secondary findings of acute cholecystitis. 7. Mild bilateral renal atrophy. 8. Chronic bilateral L4 pars defects with mild grade 1 anterolisthesis of  L4 on L5. Aortic Atherosclerosis (ICD10-I70.0). Electronically Signed   By: Fernando Hoyer M.D.   On: 11/30/2023 16:04       Phillippa Straub M.D. [facility] 12/02/2023, 12:29 PM  Available via Epic secure chat 7am-7pm After 7 pm, please refer to night coverage provider listed on amion.

## 2023-12-02 NOTE — Progress Notes (Signed)
 [facility] Progress Note   Subjective:   Patient seen and examined at bedside.  No c/o.  To have capsule endoscopy. For HD today  Objective Vitals:   12/02/23 0418 12/02/23 0744 12/02/23 0909 12/02/23 1123  BP: 116/65 113/62 (!) 97/51 (!) 117/57  Pulse:  83 81 79  Resp: 11 14 20 12   Temp: 98 F (36.7 C) 97.9 F (36.6 C) 97.9 F (36.6 C) 98.2 F (36.8 C)  TempSrc: Oral Oral  Oral  SpO2:  99% 97% 97%  Weight:   100.8 kg   Height:   6\' 1"  (1.854 m)    Physical Exam General:chronically ill appearing male in NAD; lying in bed Heart:RRR,+3/6 systolic murmur Lungs:CTAB, nml WOB on RA Abdomen:soft, NTND Extremities:no LE edema, R BKA Dialysis Access: LU AVF +b/t   Filed Weights   11/30/23 1231 12/01/23 0500 12/02/23 0909  Weight: 100.3 kg 100.8 kg 100.8 kg    Intake/Output Summary (Last 24 hours) at 12/02/2023 1207 Last data filed at 12/02/2023 0546 Gross per 24 hour  Intake 970 ml  Output --  Net 970 ml    Additional Objective Labs: Basic Metabolic Panel: Recent Labs  Lab 11/30/23 0249  NA 130*  K 5.6*  CL 94*  CO2 24  GLUCOSE 88  BUN 56*  CREATININE 11.83*  CALCIUM  9.2   Liver Function Tests: No results for input(s): "AST", "ALT", "ALKPHOS", "BILITOT", "PROT", "ALBUMIN " in the last 168 hours.   CBC: Recent Labs  Lab 11/27/23 1924 11/30/23 0249 12/01/23 0246  WBC  --  8.9 7.0  NEUTROABS  --  4.9  --   HGB 11.6* 9.4* 9.8*  HCT 35.0* 28.9* 30.4*  MCV  --  87.8 89.4  PLT  --  275 247   Blood Culture    Component Value Date/Time   SDES CSF 11/19/2023 1134   SPECREQUEST NONE 11/19/2023 1134   CULT  11/19/2023 1134    NO GROWTH 3 DAYS Performed at [facility], [address] [place], [place] [address]    REPTSTATUS 11/23/2023 FINAL 11/19/2023 1134    CBG: Recent Labs  Lab 12/01/23 1131 12/01/23 1606 12/01/23 2053 12/02/23 0916 12/02/23 1123  GLUCAP 219* 104* 180* 117* 106*    Medications:  iron  sucrose 440  mL/hr at 12/02/23 0546     atorvastatin   40 mg Oral Daily   Chlorhexidine  Gluconate Cloth  6 each Topical Q0600   cinacalcet   30 mg Oral Q breakfast   darbepoetin (ARANESP ) injection - DIALYSIS  100 mcg Subcutaneous Q Mon-1800   doxercalciferol   3 mcg Intravenous Q M,W,F-HD   feeding supplement  237 mL Oral BID BM   insulin  aspart  0-5 Units Subcutaneous QHS   insulin  aspart  0-9 Units Subcutaneous TID WC   insulin  glargine-yfgn  25 Units Subcutaneous Daily   midodrine   10 mg Oral TID WC   multivitamin  1 tablet Oral QHS   pantoprazole  (PROTONIX ) IV  40 mg Intravenous BID   sevelamer  carbonate  4,000 mg Oral TID WC   thiamine   100 mg Oral Daily    Dialysis Orders: [facility] MWF 4h  450/700  105kg   2/2 bath   AVF   Heparin  7000 + 2000 midrun - Hectorol  7 mcg IV three times per week   Assessment/Plan: 1.AMS: Was on cefepime  which has been stopped. Per primary team some concern for encephalitis/meningitis, was on empiric antibiotics/antivirals until seen by ID on 11/18/23 and all was  stopped.  S/P LP which ruled out bacterail meningitis/encephalitis. He did test + for flu A by resp panel. MRI without acute abnormality.  ID stopped abx and antivirals and signed off. Getting trial of high-dose thiamine . Mental status has sig improved - appears close to baseline today. 2.  ESRD - usual HD is MWF. Next HD 12/01/22 not ready for a chair 3. NSTEMI- work up per cardiology/primary. No plan for invasive procedure and not DAPT candidate per cardiology.  Cardiology signed off.  4. Volume - under EDW. Will likely need lower dry weight on d/c. UF as tolerated.  5. Fever/Hypotension- BC with NGTD. Was started on board spectrum antibiotics/antivirals but now discontinued.  ID signed off. 6. HFrEF/history of severe AS- attempting to optimize volume with dialysis. ECHO  EF 30 to 35%. Seen by Cards - medical management of AS. Fluid restrictions and daily weights. On midodrine  10 mg 3  times daily.  7. Anemia  - Hgb 9.8 drifting down and GI Following.  ESA 1/13. Start Fe load today 1/14.  8. Metabolic bone disease -Phos at goal. Continue renvela . VDRA on hold d/t high calcium ; coming down. 9. Debility - memory is better however he remains debilitated.Needs to demonstrate HD in chair for return to outpt HD. Not there yet.... 10. GIB/Norovirus - C diff neg. GI following. Bloody BM reported.  1 unit pRBC given 11/26/22.    Charletta Cons, MD  [facility] 12/02/2023,12:07 PM  LOS: 24 days

## 2023-12-02 NOTE — Progress Notes (Signed)
 Patient swallowed [employer] Pillcam at 0925 with no difficulties. Instructions left on nurses computer in room and given to patient. Patient is to remain NPO until 1125. Patient can have clear liquids (nothing red) at 1135. He can have a light snack at 1325. Then at 1735 he can resume to previously ordered diet. Leads, belt, and monitor may be removed at 2135 and placed in the patient belonging bag. Endo will pick these items up tomorrow morning.

## 2023-12-03 ENCOUNTER — Other Ambulatory Visit (HOSPITAL_COMMUNITY): Payer: Self-pay

## 2023-12-03 ENCOUNTER — Telehealth (HOSPITAL_COMMUNITY): Payer: Self-pay | Admitting: Pharmacy Technician

## 2023-12-03 DIAGNOSIS — I890 Lymphedema, not elsewhere classified: Secondary | ICD-10-CM

## 2023-12-03 DIAGNOSIS — T18128A Food in esophagus causing other injury, initial encounter: Secondary | ICD-10-CM | POA: Diagnosis not present

## 2023-12-03 DIAGNOSIS — K2970 Gastritis, unspecified, without bleeding: Secondary | ICD-10-CM | POA: Diagnosis not present

## 2023-12-03 DIAGNOSIS — K2980 Duodenitis without bleeding: Secondary | ICD-10-CM

## 2023-12-03 DIAGNOSIS — I214 Non-ST elevation (NSTEMI) myocardial infarction: Secondary | ICD-10-CM | POA: Diagnosis not present

## 2023-12-03 DIAGNOSIS — I5023 Acute on chronic systolic (congestive) heart failure: Secondary | ICD-10-CM | POA: Diagnosis not present

## 2023-12-03 DIAGNOSIS — I9589 Other hypotension: Secondary | ICD-10-CM | POA: Diagnosis not present

## 2023-12-03 DIAGNOSIS — T182XXA Foreign body in stomach, initial encounter: Secondary | ICD-10-CM

## 2023-12-03 DIAGNOSIS — N186 End stage renal disease: Secondary | ICD-10-CM | POA: Diagnosis not present

## 2023-12-03 LAB — GLUCOSE, CAPILLARY
Glucose-Capillary: 182 mg/dL — ABNORMAL HIGH (ref 70–99)
Glucose-Capillary: 149 mg/dL — ABNORMAL HIGH (ref 70–99)
Glucose-Capillary: 151 mg/dL — ABNORMAL HIGH (ref 70–99)
Glucose-Capillary: 186 mg/dL — ABNORMAL HIGH (ref 70–99)

## 2023-12-03 LAB — COMPREHENSIVE METABOLIC PANEL
ALT: 21 U/L (ref 0–44)
AST: 35 U/L (ref 15–41)
Albumin: 2.8 g/dL — ABNORMAL LOW (ref 3.5–5.0)
Alkaline Phosphatase: 88 U/L (ref 38–126)
Anion gap: 11 (ref 5–15)
BUN: 22 mg/dL (ref 8–23)
CO2: 24 mmol/L (ref 22–32)
Calcium: 8.8 mg/dL — ABNORMAL LOW (ref 8.9–10.3)
Chloride: 96 mmol/L — ABNORMAL LOW (ref 98–111)
Creatinine, Ser: 7.81 mg/dL — ABNORMAL HIGH (ref 0.61–1.24)
GFR, Estimated: 7 mL/min — ABNORMAL LOW (ref >60)
Glucose, Bld: 263 mg/dL — ABNORMAL HIGH (ref 70–99)
Potassium: 4.9 mmol/L (ref 3.5–5.1)
Sodium: 131 mmol/L — ABNORMAL LOW (ref 135–145)
Total Bilirubin: 0.7 mg/dL (ref 0.0–1.2)
Total Protein: 6.9 g/dL (ref 6.5–8.1)

## 2023-12-03 LAB — CBC
HCT: 31.7 % — ABNORMAL LOW (ref 39.0–52.0)
Hemoglobin: 9.9 g/dL — ABNORMAL LOW (ref 13.0–17.0)
MCH: 28.5 pg (ref 26.0–34.0)
MCHC: 31.2 g/dL (ref 30.0–36.0)
MCV: 91.4 fL (ref 80.0–100.0)
Platelets: 241 10*3/uL (ref 150–400)
RBC: 3.47 MIL/uL — ABNORMAL LOW (ref 4.22–5.81)
RDW: 16.3 % — ABNORMAL HIGH (ref 11.5–15.5)
WBC: 6.8 10*3/uL (ref 4.0–10.5)
nRBC: 0 % (ref 0.0–0.2)

## 2023-12-03 MED ORDER — IRON SUCROSE 500 MG IVPB - SIMPLE MED
500.0000 mg | Freq: Once | INTRAVENOUS | Status: DC
  Filled 2023-12-03: qty 275

## 2023-12-03 MED ORDER — ASPIRIN 81 MG PO TBEC
81.0000 mg | DELAYED_RELEASE_TABLET | Freq: Every day | ORAL | Status: DC
  Administered 2023-12-03 – 2023-12-15 (×12): 81 mg via ORAL
  Filled 2023-12-03 (×12): qty 1

## 2023-12-03 NOTE — Progress Notes (Addendum)
Physical Therapy Treatment Patient Details Name: [name] CLEMONS Sr. MRN: [id number] DOB: [birth year] Today's Date: 12/03/2023   History of Present Illness Pt is 63 year old presented to [facility] on  11/08/23 after experiencing a near syncope during HD. Pt being worked up for an NSTEMI pt unable to tolerate medication and no longer plan to perform cardiac cath, and fever with acute metabolic encephalopathy.  Pt recently admitted 12/6-12/10/24 for a fall. PMH - htn, cardiomyopathy, CHF, dm, esrd on hd, R BKA, CVA    PT Comments  Pt admitted with above diagnosis. Pt was able to transfer laterally scoot to drop arm recliner with mod assist of 2 needing less assist than last visit. Pt following commands better during session today as well.  Pt sat in chair a little over 3 hours 1/14. Pt encouraged to sit up 4 hours if able and did place geomat under pt to decr pressure on buttocks. Reviewed weight shifting with pt as well.  VSS throughout. Continue acute PT.  Pt currently with functional limitations due to the deficits listed below (see PT Problem List). Pt will benefit from acute skilled PT to increase their independence and safety with mobility to allow discharge.       If plan is discharge home, recommend the following: Two people to help with walking and/or transfers;Assistance with cooking/housework;Assist for transportation;Help with stairs or ramp for entrance;Supervision due to cognitive status   Can travel by private vehicle        Equipment Recommendations  Other (comment);Hoyer lift;Wheelchair (measurements PT);Wheelchair cushion (measurements PT);Hospital bed (defer to post acute)    Recommendations for Other Services       Precautions / Restrictions Precautions Precautions: Fall Precaution Comments: DROPLET - flu and Norovirus precatuions Required Braces or Orthoses: Other Brace Other Brace: R prosthetic (does not fit and pt states he didnt use it for transfers) Restrictions Weight  Bearing Restrictions Per Provider Order: No RLE Weight Bearing Per Provider Order: Non weight bearing Other Position/Activity Restrictions: R BKA     Mobility  Bed Mobility Overal bed mobility: Needs Assistance Bed Mobility: Rolling, Sidelying to Sit, Sit to Sidelying Rolling: Used rails, +2 for physical assistance, Min assist Sidelying to sit: Used rails, +2 for physical assistance, Mod assist       General bed mobility comments: pt continues with better activation of RLE than L. Pt was able to assist using rails this session with intermittent verbal cues for sequencing in order to decrease physical assist required for mobility. Pt took incr time to get to EOB needing mod +2 assist and took incr time for pt to get balance once up as pt with decr use of left hand due to c/o pain.    Transfers Overall transfer level: Needs assistance   Transfers: Bed to chair/wheelchair/BSC            Lateral/Scoot Transfers: +2 physical assistance, From elevated surface, Mod assist General transfer comment: Pt scooted to recliner with mod assist of 2 persons with use of pad. Pt having difficulty assisting as his left hand was so painful. Pt doing much better with assisting with lateral scoot today as he was weight shifting to command and able to use bil UEs more (used right UE better than left UE). Took incr time to get pt positioned on geomat.   Spoke with nurse and mobility to use MAximove to get pt back to bed.    Ambulation/Gait Ambulation/Gait assistance:  (unable to ambulate at this time due to  impaired prosthetic fit)             General Gait Details: unable   Stairs             Wheelchair Mobility     Tilt Bed    Modified Rankin (Stroke Patients Only)       Balance Overall balance assessment: Needs assistance Sitting-balance support: Feet supported, Bilateral upper extremity supported, Single extremity supported Sitting balance-Leahy Scale: Fair Sitting balance -  Comments: Once in sitting pt was able to sit with CGA for at least 5 min today and min assist initially upon sitting       Standing balance comment: unable to get to standing.                            Cognition Arousal: Alert Behavior During Therapy: WFL for tasks assessed/performed Overall Cognitive Status: Impaired/Different from baseline Area of Impairment: Memory, Following commands, Safety/judgement, Awareness, Problem solving                 Orientation Level: Disoriented to, Situation, Time Current Attention Level: Sustained Memory: Decreased short-term memory Following Commands: Follows one step commands with increased time, Follows one step commands consistently Safety/Judgement: Decreased awareness of safety, Decreased awareness of deficits Awareness: Intellectual Problem Solving: Slow processing, Decreased initiation, Requires verbal cues, Difficulty sequencing          Exercises Amputee Exercises Quad Sets: AROM, Both, 5 reps Hip Flexion/Marching: AROM, Both, 10 reps, Supine Knee Flexion: AROM, Both, 10 reps, Supine Knee Extension: AROM, Both, Seated, 10 reps Straight Leg Raises: AROM, Both, 5 reps, Supine    General Comments        Pertinent Vitals/Pain Pain Assessment Pain Assessment: Faces Faces Pain Scale: Hurts even more Breathing: normal Negative Vocalization: none Facial Expression: smiling or inexpressive Body Language: relaxed Consolability: no need to console PAINAD Score: 0 Pain Location: left hand Pain Descriptors / Indicators: Guarding, Grimacing, Discomfort Pain Intervention(s): Limited activity within patient's tolerance, Monitored during session, Premedicated before session, Repositioned    Home Living                          Prior Function            PT Goals (current goals can now be found in the care plan section) Acute Rehab PT Goals Patient Stated Goal: unable to state Progress towards PT  goals: Progressing toward goals    Frequency    Min 1X/week      PT Plan      Co-evaluation              AM-PAC PT "6 Clicks" Mobility   Outcome Measure  Help needed turning from your back to your side while in a flat bed without using bedrails?: A Little Help needed moving from lying on your back to sitting on the side of a flat bed without using bedrails?: Total Help needed moving to and from a bed to a chair (including a wheelchair)?: Total Help needed standing up from a chair using your arms (e.g., wheelchair or bedside chair)?: Total Help needed to walk in hospital room?: Total Help needed climbing 3-5 steps with a railing? : Total 6 Click Score: 8    End of Session Equipment Utilized During Treatment: Gait belt Activity Tolerance: Patient limited by fatigue;Patient limited by pain Patient left: in chair;with call bell/phone within reach;with chair alarm set Nurse  Communication: Need for lift equipment;Mobility status (maximove recommended) PT Visit Diagnosis: Muscle weakness (generalized) (M62.81);Difficulty in walking, not elsewhere classified (R26.2);Pain Pain - Right/Left: Left Pain - part of body: Hand     Time: 6295-2841 PT Time Calculation (min) (ACUTE ONLY): 27 min  Charges:    $Therapeutic Exercise: 8-22 mins $Therapeutic Activity: 8-22 mins PT General Charges $$ ACUTE PT VISIT: 1 Visit                     Lanita Stammen M,PT Acute Rehab Services [phone]    Bevelyn Buckles 12/03/2023, 1:19 PM

## 2023-12-03 NOTE — Progress Notes (Signed)
Patient transferred to recliner with PT, after 1 hr had requested to be transferred back to bed. RN educated patient on importance of tolerating a seated position for at least 4 hours in order to tolerate outpatient HD.   Patient agreeable and remained in recliner for 1 more hour before insisting a transfer back to bed. Maxi move utilized and patient placed back in bed. RN discussed with patient that in order to tolerate dialysis he will need to stay seated for longer, and he understands this.

## 2023-12-03 NOTE — Telephone Encounter (Signed)
Patient Product/process development scientist completed.    The patient is insured through [employer]. Patient has Medicare and is not eligible for a copay card, but may be able to apply for patient assistance or Medicare RX Payment Plan (Patient Must reach out to their plan, if eligible for payment plan), if available.    Ran test claim for insulin glargin-yfgn (Semglee) and the current 30 day co-pay is $1.60.  Ran test claim for Lantus Pen and not on Formulary  Ran test claim for Basaglar Pen and not on Formulary  Ran test claim for Semglee Pen and not on Formulary  This test claim was processed through [employer]- copay amounts may vary at other pharmacies due to [employer], or as the patient moves through the different stages of their insurance plan.     Roland Earl, CPHT Pharmacy Technician III Certified Patient Advocate [facility] Patient Advocate Team Direct Number: [phone]  Fax: [phone]

## 2023-12-03 NOTE — Progress Notes (Signed)
OT Cancellation Note  Patient Details Name: [name] GROEBNER Sr. MRN: [id number] DOB: 07-Sep-1961   Cancelled Treatment:    Reason Eval/Treat Not Completed: Patient declined, no reason specified. Pt states, "Nah, I am done for today and just want to get back in bed". Pt up with PT earlier in recliner and encouraged to tolerate more OOB sitting. OT will follow up next available time  Galen Manila 12/03/2023, 1:41 PM

## 2023-12-03 NOTE — Progress Notes (Addendum)
Contacted [facility] to be advised that pt will not be starting at clinic tomorrow. Pt has ben accepted at [facility] on MWF 11:00 am chair time. Pt will need to arrive at 10:15 am for first appt for paperwork prior to treatment. If pt is confused, family will likely need to meet pt at clinic to assist with paperwork. Pt will also need to be able to tolerate HD in chair prior to d/c to make sure pt is out-pt appropriate. Will need to confirm start date with clinic once d/c date in confirmed. Info provided to CSW. Will assist as needed.   Olivia Canter Renal Navigator [phone]

## 2023-12-03 NOTE — Progress Notes (Signed)
[facility] Progress Note   Subjective:    No interval events HD yesterday 1.7L UF Completed capsule endoscopy Hb stable 9.9  Objective Vitals:   12/02/23 1953 12/02/23 2311 12/03/23 0456 12/03/23 0757  BP: (!) 95/50 (!) 117/58 (!) 105/58 119/67  Pulse: 80 84 84 85  Resp: 19 (!) 22 19 20   Temp: 98 F (36.7 C) 98.5 F (36.9 C) 98.7 F (37.1 C) 98.9 F (37.2 C)  TempSrc: Oral Oral Oral Oral  SpO2: 98% 99% 98% 95%  Weight:   108 kg   Height:       Physical Exam General:chronically ill appearing male in NAD; lying in bed Heart:RRR,+3/6 systolic murmur Lungs:CTAB, nml WOB on RA Abdomen:soft, NTND Extremities:no LE edema, R BKA Dialysis Access: LU AVF +b/t   Filed Weights   12/02/23 0909 12/02/23 1219 12/03/23 0456  Weight: 100.8 kg 100.3 kg 108 kg    Intake/Output Summary (Last 24 hours) at 12/03/2023 0959 Last data filed at 12/03/2023 0815 Gross per 24 hour  Intake 100 ml  Output 501.7 ml  Net -401.7 ml    Additional Objective Labs: Basic Metabolic Panel: Recent Labs  Lab 11/30/23 0249 12/02/23 1144 12/03/23 0246  NA 130* 132* 131*  K 5.6* 5.4* 4.9  CL 94* 94* 96*  CO2 24 25 24   GLUCOSE 88 124* 263*  BUN 56* 40* 22  CREATININE 11.83* 11.57* 7.81*  CALCIUM 9.2 9.5 8.8*   Liver Function Tests: Recent Labs  Lab 12/02/23 1144 12/03/23 0246  AST 27 35  ALT 20 21  ALKPHOS 90 88  BILITOT 0.6 0.7  PROT 7.2 6.9  ALBUMIN 2.9* 2.8*     CBC: Recent Labs  Lab 11/30/23 0249 12/01/23 0246 12/02/23 1144 12/03/23 0246  WBC 8.9 7.0 6.9 6.8  NEUTROABS 4.9  --   --   --   HGB 9.4* 9.8* 9.9* 9.9*  HCT 28.9* 30.4* 30.8* 31.7*  MCV 87.8 89.4 90.1 91.4  PLT 275 247 258 241   Blood Culture    Component Value Date/Time   SDES CSF 11/19/2023 1134   SPECREQUEST NONE 11/19/2023 1134   CULT  11/19/2023 1134    NO GROWTH 3 DAYS Performed at [facility], [address], [place], [place] [address]    REPTSTATUS 11/23/2023 FINAL  11/19/2023 1134    CBG: Recent Labs  Lab 12/02/23 0916 12/02/23 1123 12/02/23 1856 12/02/23 2101 12/03/23 0536  GLUCAP 117* 106* 130* 140* 182*    Medications:  iron sucrose 440 mL/hr at 12/02/23 0546   [START ON 12/04/2023] iron sucrose       atorvastatin  40 mg Oral Daily   Chlorhexidine Gluconate Cloth  6 each Topical Q0600   cinacalcet  30 mg Oral Q breakfast   darbepoetin (ARANESP) injection - DIALYSIS  100 mcg Subcutaneous Q Mon-1800   doxercalciferol  3 mcg Intravenous Q M,W,F-HD   feeding supplement  237 mL Oral BID BM   insulin aspart  0-5 Units Subcutaneous QHS   insulin aspart  0-9 Units Subcutaneous TID WC   insulin glargine-yfgn  25 Units Subcutaneous Daily   midodrine  10 mg Oral TID WC   multivitamin  1 tablet Oral QHS   pantoprazole (PROTONIX) IV  40 mg Intravenous BID   sevelamer carbonate  4,000 mg Oral TID WC   thiamine  100 mg Oral Daily    Dialysis Orders: [facility] MWF 4h  450/700  105kg   2/2 bath  AVF   Heparin 7000 + 2000 midrun - Hectorol 7 mcg IV three times per week   Assessment/Plan: 1.AMS: Was on cefepime which has been stopped. Per primary team some concern for encephalitis/meningitis, was on empiric antibiotics/antivirals until seen by ID on 11/18/23 and all was stopped.  S/P LP which ruled out bacterail meningitis/encephalitis. He did test + for flu A by resp panel. MRI without acute abnormality.  ID stopped abx and antivirals and signed off. Getting trial of high-dose thiamine. Mental status has sig improved - Much improved 2.  ESRD - usual HD is MWF. Next HD 12/03/22 not ready for a chair 3. NSTEMI- work up per cardiology/primary. No plan for invasive procedure and not DAPT candidate per cardiology.  Cardiology signed off.  4. Volume - under EDW. Will likely need lower dry weight on d/c. UF as tolerated.  5. Fever/Hypotension- BC with NGTD. Was started on board spectrum antibiotics/antivirals but now discontinued.  ID  signed off. 6. HFrEF/history of severe AS- attempting to optimize volume with dialysis. ECHO  EF 30 to 35%. Seen by Cards - medical management of AS. Fluid restrictions and daily weights. On midodrine 10 mg 3 times daily.  7. Anemia  - Hgb 9.9 now stable and GI Following.  ESA 1/13. Start Fe load today 1/14. As per below 8. Metabolic bone disease -Continue renvela. VDRA on hold d/t high calcium; coming down. 9. Debility - memory is better however he remains debilitated.Needs to demonstrate HD in chair for return to outpt HD. Not there yet.... 10. GIB/Norovirus - C diff neg. GI following. Bloody BMs reported.  1 unit pRBC given 11/26/22.  Just finished capsule endoscopy  Arita Miss, MD  [facility] 12/03/2023,9:59 AM  LOS: 25 days

## 2023-12-03 NOTE — Progress Notes (Addendum)
[facility] arranging f/u in anticipation of DC. Patient previously lived in [place] before admission. Per d/w Dr. Isidoro Donning, patient will be going to SNF in [place]. Outlined appt on AVS Tuesday Dec 22, 2023 10:55 AM with Azalee Course PA-C. Regarding antiplatelet plan, Dr. Antoine Poche advised ASA only at DC.

## 2023-12-03 NOTE — Progress Notes (Addendum)
[facility]                                                                              [name] [name], is a 63 y.o. male, DOB - 05/25/1961, YQM:[id number] Admit date - 11/08/2023    Outpatient Primary MD for the patient is Pcp, No  LOS - 25  days  Chief Complaint  Patient presents with   Hypotension   Cough   Shortness of Breath       Brief summary   Patient is a 63 y.o. male with PMH significant for ESRD-HD-MWF, DM2, HTN, nonischemic cardiomyopathy, mild systolic CHF, prior right BKA.  Patient presented to ED on 12/22 after experiencing an episode of chest pain, hypotension and near syncope during hemodialysis.  His initial blood pressure was low in 75/59 Troponin was significantly elevated and peaked at 9000, he was diagnosed with NSTEMI, started on heparin drip.  Patient was admitted for further workup.   Hospital course was also complicated altered mental status, influenza, GI bleeding, norovirus diarrhea Patient is currently weak and unable to be in a chair for dialysis. Palliative care consulted.   Assessment & Plan   NSTEMI -Presented with chest pain, hypertension, syncope, significantly elevated troponin  -2D echo 11/09/2023 showed EF 30 -35% with regional WMA, moderate to severe aortic stenosis - Initially started on medical management with IV heparin drip, aspirin and Lipitor -Initial consideration of cardiac cath, was placed on trial of DAPT.  However after single dose of Plavix with aspirin, patient had profuse rectal bleeding, hence DAPT held.  Per cardiology, no plan for inpatient ischemic evaluation.  This can be revisited as outpatient when GI issues stabilized and cleared to utilize DAPT going forward. -Continue atorvastatin  Acute GI bleeding  Norovirus diarrhea -Patient had several days of GI bleeding, worsened with DAPT -  Bleeding suspected to be combination of norovirus diarrhea, ischemic colitis, no prior colonoscopy.  FOBT positive on  1/3, GI pathogen panel positive for norovirus -CTA GI bleed 1/13 showed no active GI bleeding in the imaging  -GI, Dr. Leonides Schanz following, patient was offered EGD, colonoscopy however declined.   -Capsule endoscopy on 1/15? -H&H stable, 9.9 Addendum: 2:30 PM Per Dr. Leonides Schanz, capsule endoscopy completed, gastritis and duodenitis with small bowel angioectasia, no active bleeding  -Continue PPI twice daily, will check stool H. pylori antigen -Okay to start anticoagulation/antiplatelet agent from GI perspective -discussed with Dr Antoine Poche, recommended aspirin 81 mg daily for now.  No DAPT, will plan on outpatient cardiology workup.    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  Currently on midodrine  -Metoprolol held due to hypotension.   -Cardiology will follow outpatient.  Continue HD for volume management.    ESRD-HD-MWF -Nephrology following -Continue HD per schedule Patient needs to be able to tolerate dialysis in chair to consider outpatient dialysis.   Influenza A positive -RVP positive for flu A + on 12/30.  Completed  the course of Tamiflu.   Acute metabolic encephalopathy, likely due to prolonged delirium -Patient hospital stay was complicated by altered mental status.  There was initial concern for encephalitis vs meningitis. Neurology was consulted.  Patient was placed on broad-spectrum antibiotics -EEG 12/27 and 1/1 did not show any evidence of seizure activity. -MRI brain 1/3 negative for acute abnormality  -LP on 1/3,  CSF labs significant for elevated glucose and protein, not consistent with meningitis. Meningitis/encephalitis panel negative for pathogen. -Pulmonology, continue supportive care, improvedlikely due to gradual -ID and neurology signed  off. -Mental status improving   Type 2 diabetes mellitus uncontrolled -Hemoglobin A1c > 15.5 on 10/24/2023   CBG (last 3)  Recent Labs    12/02/23 2101 12/03/23 0536 12/03/23 1154  GLUCAP 140* 182* 149*   -Continue Semglee 25 units daily, sensitive sliding scale insulin    H/o CVA Continue Lipitor   Estimated body mass index is 31.41 kg/m as calculated from the following:   Height as of this encounter: 6\' 1"  (1.854 m).   Weight as of this encounter: 108 kg.  Code Status: Full code DVT Prophylaxis:  Place and maintain sequential compression device Start: 11/20/23 0706 SCDs Start: 11/09/23 1558   Level of Care: Level of care: Progressive Family Communication:  Disposition Plan:      Remains inpatient appropriate:   Need to be able to tolerate HD in chair for outpatient HD center   Procedures:    Consultants:   Gastroenterology Cardiology Palliative medicine  Antimicrobials:   Anti-infectives (From admission, onward)    Start     Dose/Rate Route Frequency Ordered Stop   11/18/23 1800  oseltamivir (TAMIFLU) capsule 30 mg        30 mg Oral Every M-W-F (Hemodialysis) 11/18/23 1116 11/23/23 1159   11/17/23 1800  oseltamivir (TAMIFLU) capsule 30 mg  Status:  Discontinued        30 mg Oral Every T-Th-Sa (1800) 11/17/23 0800 11/18/23 1116   11/17/23 1115  vancomycin (VANCOCIN) IVPB 1000 mg/200 mL premix  Status:  Discontinued        1,000 mg 200 mL/hr over 60 Minutes Intravenous Once in dialysis 11/17/23 1021 11/18/23 1331   11/17/23 1000  oseltamivir (TAMIFLU) capsule 30 mg        30 mg Oral  Once 11/17/23 0759 11/17/23 0952   11/16/23 1200  vancomycin (VANCOCIN) IVPB 1000 mg/200 mL premix  Status:  Discontinued        1,000 mg 200 mL/hr over 60 Minutes Intravenous Every M-W-F (Hemodialysis) 11/15/23 1658 11/18/23 1306   11/12/23 2300  vancomycin (VANCOCIN) IVPB 1000 mg/200 mL premix        1,000 mg 200 mL/hr over 60 Minutes Intravenous  Once 11/12/23 2226  11/13/23 0131   11/12/23 2000  cefTRIAXone (ROCEPHIN) 2 g in sodium chloride 0.9 % 100 mL IVPB  Status:  Discontinued        2 g 200 mL/hr over 30 Minutes Intravenous Every 12 hours 11/12/23 1627 11/18/23 1306   11/12/23 2000  ampicillin (OMNIPEN) 2 g in sodium chloride 0.9 % 100 mL IVPB  Status:  Discontinued        2 g 300 mL/hr over 20 Minutes Intravenous Every 12 hours 11/12/23 1627 11/18/23 1306   11/12/23 1730  acyclovir (ZOVIRAX) 450 mg in dextrose 5 % 100 mL IVPB  Status:  Discontinued        5 mg/kg  89.6 kg (Adjusted) 109 mL/hr over 60 Minutes Intravenous Every 24  hours 11/12/23 1627 11/18/23 1306   11/11/23 1915  piperacillin-tazobactam (ZOSYN) IVPB 2.25 g  Status:  Discontinued        2.25 g 100 mL/hr over 30 Minutes Intravenous Every 8 hours 11/11/23 1825 11/12/23 1615   11/11/23 1245  metroNIDAZOLE (FLAGYL) IVPB 500 mg  Status:  Discontinued        500 mg 100 mL/hr over 60 Minutes Intravenous Every 12 hours 11/11/23 1146 11/11/23 1825   11/11/23 1200  vancomycin (VANCOCIN) IVPB 1000 mg/200 mL premix  Status:  Discontinued        1,000 mg 200 mL/hr over 60 Minutes Intravenous Every M-W-F (Hemodialysis) 11/10/23 1458 11/15/23 1658   11/10/23 1545  vancomycin (VANCOCIN) IVPB 1000 mg/200 mL premix       Placed in "Followed by" Linked Group   1,000 mg 200 mL/hr over 60 Minutes Intravenous  Once 11/10/23 1458 11/10/23 1917   11/10/23 1545  vancomycin (VANCOCIN) IVPB 1000 mg/200 mL premix       Placed in "Followed by" Linked Group   1,000 mg 200 mL/hr over 60 Minutes Intravenous  Once 11/10/23 1458 11/10/23 1815   11/10/23 1545  ceFEPIme (MAXIPIME) 1 g in sodium chloride 0.9 % 100 mL IVPB  Status:  Discontinued        1 g 200 mL/hr over 30 Minutes Intravenous Every M-W-F 11/10/23 1458 11/10/23 1520   11/10/23 1545  ceFEPIme (MAXIPIME) 2 g in sodium chloride 0.9 % 100 mL IVPB  Status:  Discontinued        2 g 200 mL/hr over 30 Minutes Intravenous Every M-W-F 11/10/23 1520  11/11/23 1810          Medications  atorvastatin  40 mg Oral Daily   Chlorhexidine Gluconate Cloth  6 each Topical Q0600   cinacalcet  30 mg Oral Q breakfast   darbepoetin (ARANESP) injection - DIALYSIS  100 mcg Subcutaneous Q Mon-1800   doxercalciferol  3 mcg Intravenous Q M,W,F-HD   feeding supplement  237 mL Oral BID BM   insulin aspart  0-5 Units Subcutaneous QHS   insulin aspart  0-9 Units Subcutaneous TID WC   insulin glargine-yfgn  25 Units Subcutaneous Daily   midodrine  10 mg Oral TID WC   multivitamin  1 tablet Oral QHS   pantoprazole (PROTONIX) IV  40 mg Intravenous BID   sevelamer carbonate  4,000 mg Oral TID WC   thiamine  100 mg Oral Daily      Subjective:   [name] [name] was seen and examined today.  No acute issues.  Patient is stable, no nausea chest pain shortness of breath.    Objective:   Vitals:   12/02/23 2311 12/03/23 0456 12/03/23 0757 12/03/23 1153  BP: (!) 117/58 (!) 105/58 119/67 (!) 110/58  Pulse: 84 84 85   Resp: (!) 22 19 20 20   Temp: 98.5 F (36.9 C) 98.7 F (37.1 C) 98.9 F (37.2 C) 98.2 F (36.8 C)  TempSrc: Oral Oral Oral Oral  SpO2: 99% 98% 95% 95%  Weight:  108 kg    Height:        Intake/Output Summary (Last 24 hours) at 12/03/2023 1304 Last data filed at 12/03/2023 1035 Gross per 24 hour  Intake 320 ml  Output 1.7 ml  Net 318.3 ml     Wt Readings from Last 3 Encounters:  12/03/23 108 kg  10/26/23 110.5 kg  10/22/23 108.9 kg   Physical Exam General: Alert and oriented x 3, NAD Cardiovascular:  S1 S2 clear, RRR.  Respiratory: CTAB Gastrointestinal: Soft, nontender, nondistended, NBS Ext: no pedal edema bilaterally Neuro: no new deficits Psych: Normal affect      Data Reviewed:  I have personally reviewed following labs    CBC Lab Results  Component Value Date   WBC 6.8 12/03/2023   RBC 3.47 (L) 12/03/2023   HGB 9.9 (L) 12/03/2023   HCT 31.7 (L) 12/03/2023   MCV 91.4 12/03/2023   MCH 28.5 12/03/2023    PLT 241 12/03/2023   MCHC 31.2 12/03/2023   RDW 16.3 (H) 12/03/2023   LYMPHSABS 1.8 11/30/2023   MONOABS 1.7 (H) 11/30/2023   EOSABS 0.4 11/30/2023   BASOSABS 0.1 11/30/2023     Last metabolic panel Lab Results  Component Value Date   NA 131 (L) 12/03/2023   K 4.9 12/03/2023   CL 96 (L) 12/03/2023   CO2 24 12/03/2023   BUN 22 12/03/2023   CREATININE 7.81 (H) 12/03/2023   GLUCOSE 263 (H) 12/03/2023   GFRNONAA 7 (L) 12/03/2023   GFRAA 3 (L) 08/14/2019   CALCIUM 8.8 (L) 12/03/2023   PHOS 8.0 (H) 11/23/2023   PROT 6.9 12/03/2023   ALBUMIN 2.8 (L) 12/03/2023   LABGLOB 5.1 (H) 12/17/2022   AGRATIO 0.7 11/20/2022   BILITOT 0.7 12/03/2023   ALKPHOS 88 12/03/2023   AST 35 12/03/2023   ALT 21 12/03/2023   ANIONGAP 11 12/03/2023    CBG (last 3)  Recent Labs    12/02/23 2101 12/03/23 0536 12/03/23 1154  GLUCAP 140* 182* 149*      Coagulation Profile: No results for input(s): "INR", "PROTIME" in the last 168 hours.   Radiology Studies: I have personally reviewed the imaging studies  No results found.      Thad Ranger M.D. [facility] 12/03/2023, 1:04 PM  Available via Epic secure chat 7am-7pm After 7 pm, please refer to night coverage provider listed on amion.

## 2023-12-03 NOTE — Plan of Care (Signed)
°  Problem: Education: °Goal: Knowledge of General Education information will improve °Description: Including pain rating scale, medication(s)/side effects and non-pharmacologic comfort measures °Outcome: Progressing °  °Problem: Clinical Measurements: °Goal: Ability to maintain clinical measurements within normal limits will improve °Outcome: Progressing °  °Problem: Nutrition: °Goal: Adequate nutrition will be maintained °Outcome: Progressing °  °

## 2023-12-03 NOTE — TOC Progression Note (Addendum)
Transition of Care (TOC) - Progression Note    Patient Details  Name: [name] [name] Sr. MRN: [id number] Date of Birth: [birth year]  Transition of Care [facility]) CM/SW Contact  Michaela Corner, [place] Phone Number: 12/03/2023, 11:09 AM  Clinical Narrative:   Per MD, pt not medically stable to DC. CSW spoke with HD coordinator, pt will need to be able to tolerate HD in chair prior to d/c to make sure appropriate for out-pt HD.   TOC will continue to follow.   Expected Discharge Plan: Skilled Nursing Facility Barriers to Discharge: Continued Medical Work up  Expected Discharge Plan and Services       Living arrangements for the past 2 months: Single Family Home                           HH Arranged:  (Active with [facility])           Social Determinants of Health (SDOH) Interventions SDOH Screenings   Food Insecurity: Patient Unable To Answer (11/10/2023)  Housing: Patient Unable To Answer (11/10/2023)  Transportation Needs: Unknown (11/10/2023)  Utilities: Patient Unable To Answer (11/10/2023)  Alcohol Screen: Low Risk  (02/04/2022)  Depression (PHQ2-9): Low Risk  (08/06/2022)  Tobacco Use: Low Risk  (12/02/2023)    Readmission Risk Interventions    11/05/2022    3:08 PM  Readmission Risk Prevention Plan  Transportation Screening Complete  Medication Review (RN Care Manager) Complete  PCP or Specialist appointment within 3-5 days of discharge Complete  HRI or Home Care Consult Complete  SW Recovery Care/Counseling Consult Complete  Palliative Care Screening Complete  Skilled Nursing Facility Complete

## 2023-12-03 NOTE — Progress Notes (Signed)
Patient incontinent of stool and passed GI capsule from study. Large, formed BM with no blood noted.

## 2023-12-03 NOTE — Progress Notes (Addendum)
Brief GI Capsule Note: Procedure Findings: First gastric image 04:52:41 First duodenal image 06:32:08 First cecal image 08:24:36  Capsule spent a large amount of time in the esophagus, perhaps suggesting a stricture or esophageal motility. No clear stricture was visualized, but visibility was severely limited in the distal esophagus due to the presence of food. Visibility in the stomach was severely limited due to presence of a large amount of gastric food contents. Gastritis was visualized in the mucosa that could be seen. Duodenitis was seen in the duodenal bulb. Small bowel lymphangiectasia seen at 06:48:55. Possible non-bleeding small bowel angioectasia seen at 07:48:01. Large amount of stool limited visibility in the colon. No active bleeding was seen anywhere in the gastrointestinal tract.  Recommendations: Recommend continuing PPI BID. Will check stool H pylori antigen due to presence of gastritis/duodenitis. Visualization of the esophagus, stomach, and colon were severely limited on this capsule exam. Patient previously declined EGD/colonoscopy. Would be okay to start anticoagulation/antiplatelet agent from a GI perspective since there was no active bleeding seen. Patient's hemoglobin has now stabilized. Continue iron supplementation.  I spoke to the patient about the results of his VCE. He reiterated that he would not be interested in EGD or colonoscopy. GI will sign off for now. If patient changes his mind about getting EGD/colonoscopy or develops recurrent bleeding, please feel free to contact us.  Eulah Pont, MD

## 2023-12-04 DIAGNOSIS — I5023 Acute on chronic systolic (congestive) heart failure: Secondary | ICD-10-CM | POA: Diagnosis not present

## 2023-12-04 DIAGNOSIS — N186 End stage renal disease: Secondary | ICD-10-CM | POA: Diagnosis not present

## 2023-12-04 DIAGNOSIS — Z992 Dependence on renal dialysis: Secondary | ICD-10-CM | POA: Diagnosis not present

## 2023-12-04 DIAGNOSIS — I214 Non-ST elevation (NSTEMI) myocardial infarction: Secondary | ICD-10-CM | POA: Diagnosis not present

## 2023-12-04 LAB — GLUCOSE, CAPILLARY
Glucose-Capillary: 247 mg/dL — ABNORMAL HIGH (ref 70–99)
Glucose-Capillary: 178 mg/dL — ABNORMAL HIGH (ref 70–99)
Glucose-Capillary: 220 mg/dL — ABNORMAL HIGH (ref 70–99)
Glucose-Capillary: 283 mg/dL — ABNORMAL HIGH (ref 70–99)

## 2023-12-04 LAB — BASIC METABOLIC PANEL
Anion gap: 11 (ref 5–15)
BUN: 30 mg/dL — ABNORMAL HIGH (ref 8–23)
CO2: 24 mmol/L (ref 22–32)
Calcium: 9.7 mg/dL (ref 8.9–10.3)
Chloride: 97 mmol/L — ABNORMAL LOW (ref 98–111)
Creatinine, Ser: 9.49 mg/dL — ABNORMAL HIGH (ref 0.61–1.24)
GFR, Estimated: 6 mL/min — ABNORMAL LOW (ref >60)
Glucose, Bld: 259 mg/dL — ABNORMAL HIGH (ref 70–99)
Potassium: 5.4 mmol/L — ABNORMAL HIGH (ref 3.5–5.1)
Sodium: 132 mmol/L — ABNORMAL LOW (ref 135–145)

## 2023-12-04 LAB — CBC
HCT: 30.1 % — ABNORMAL LOW (ref 39.0–52.0)
Hemoglobin: 9.5 g/dL — ABNORMAL LOW (ref 13.0–17.0)
MCH: 28.8 pg (ref 26.0–34.0)
MCHC: 31.6 g/dL (ref 30.0–36.0)
MCV: 91.2 fL (ref 80.0–100.0)
Platelets: 253 10*3/uL (ref 150–400)
RBC: 3.3 MIL/uL — ABNORMAL LOW (ref 4.22–5.81)
RDW: 16.4 % — ABNORMAL HIGH (ref 11.5–15.5)
WBC: 8 10*3/uL (ref 4.0–10.5)
nRBC: 0 % (ref 0.0–0.2)

## 2023-12-04 MED ORDER — PANTOPRAZOLE SODIUM 40 MG PO TBEC
40.0000 mg | DELAYED_RELEASE_TABLET | Freq: Two times a day (BID) | ORAL | Status: DC
  Administered 2023-12-04 – 2023-12-15 (×23): 40 mg via ORAL
  Filled 2023-12-04 (×23): qty 1

## 2023-12-04 MED ORDER — INSULIN ASPART 100 UNIT/ML IJ SOLN
2.0000 [IU] | Freq: Three times a day (TID) | INTRAMUSCULAR | Status: DC
Start: 2023-12-04 — End: 2023-12-15
  Administered 2023-12-04 – 2023-12-15 (×24): 2 [IU] via SUBCUTANEOUS

## 2023-12-04 MED ORDER — SODIUM CHLORIDE 0.9 % IV SOLN
500.0000 mg | Freq: Once | INTRAVENOUS | Status: AC
  Administered 2023-12-04: 500 mg via INTRAVENOUS
  Filled 2023-12-04 (×4): qty 25

## 2023-12-04 MED ORDER — HEPARIN SODIUM (PORCINE) 5000 UNIT/ML IJ SOLN
5000.0000 [IU] | Freq: Three times a day (TID) | INTRAMUSCULAR | Status: DC
  Administered 2023-12-04 – 2023-12-15 (×33): 5000 [IU] via SUBCUTANEOUS
  Filled 2023-12-04 (×34): qty 1

## 2023-12-04 NOTE — Progress Notes (Signed)
OT Cancellation Note  Patient Details Name: [name] PIZZINI Sr. MRN: [id number] DOB: 1960/11/22   Cancelled Treatment:    Reason Eval/Treat Not Completed: Patient at procedure or test/ unavailable. Pt off unit at HD, OT will follow up next available time  Galen Manila 12/04/2023, 9:18 AM

## 2023-12-04 NOTE — Progress Notes (Signed)
Occupational Therapy Treatment Patient Details Name: [name] [name]. MRN: [id number] DOB: 11-Dec-1960 Today's Date: 12/04/2023   History of present illness Pt is 63 year old presented [facility]  11/08/23 after experiencing a near syncope during HD. Pt being worked up for an NSTEMI pt unable to tolerate medication and no longer plan to perform cardiac cath, and fever with acute metabolic encephalopathy.  Pt recently admitted 12/6-12/10/24 for a fall. PMH - htn, cardiomyopathy, CHF, dm, esrd on hd, R BKA, CVA   OT comments  Pt making progress with functional goals. Pt in bed upon arrival, apprehensive to work with therapy, however pt's daughter on the phone and encouraged to participate; pt then agreeable to sit EOB but refusing scoot transfer to chair. Pt sat EOB with CGA using rails. Pt participated in grooming, simulated bathing tasks and UB dressing and tolerated sitting EOB ~8 minutes with CGA for balance safety. OT will continue to follow acutely to maximize level of function and safety      If plan is discharge home, recommend the following:  Two people to help with walking and/or transfers;Assistance with cooking/housework;Supervision due to cognitive status;A lot of help with bathing/dressing/bathroom   Equipment Recommendations       Recommendations for Other Services      Precautions / Restrictions Precautions Precautions: Fall Precaution Comments: DROPLET - flu and Norovirus precatuions Required Braces or Orthoses: Other Brace Other Brace: R prosthetic (does not fit and pt states he didnt use it for transfers) Restrictions Weight Bearing Restrictions Per Provider Order: No RLE Weight Bearing Per Provider Order: Non weight bearing Other Position/Activity Restrictions: R BKA       Mobility Bed Mobility Overal bed mobility: Needs Assistance   Rolling: Used rails,  CGA   Supine to sit: Contact guard Sit to supine: Contact guard assist   General bed mobility comments:  pt able to scoot self to HOB Sup once in supine    Transfers                   General transfer comment: declined lateral scoot transfers to chair     Balance Overall balance assessment: Needs assistance Sitting-balance support: Feet supported, Bilateral upper extremity supported, Single extremity supported Sitting balance-Leahy Scale: Fair Sitting balance - Comments: Once in sitting pt was able to sit with CGA for at least 8 min                                   ADL either performed or assessed with clinical judgement   ADL   Eating/Feeding: Set up;Sitting;Bed level   Grooming: Wash/dry hands;Wash/dry face;Oral care;Contact guard assist;Sitting   Upper Body Bathing: Minimal assistance;Sitting Upper Body Bathing Details (indicate cue type and reason): simulated seated EOB Lower Body Bathing: Moderate assistance;Sitting/lateral leans Lower Body Bathing Details (indicate cue type and reason): simualyed seated EOB Upper Body Dressing : Minimal assistance;Sitting                     General ADL Comments: Pt sat EOB ~8 minutes for grooming, simulated bathing tasks and UB dressing tasks, CGA for balance/support safety    Extremity/Trunk Assessment Upper Extremity Assessment Upper Extremity Assessment: Generalized weakness   Lower Extremity Assessment Lower Extremity Assessment: Defer to PT evaluation        Vision Ability to See in Adequate Light: 0 Adequate Patient Visual Report: No change from baseline  Perception     Praxis      Cognition Arousal: Alert   Overall Cognitive Status: Impaired/Different from baseline Area of Impairment: Memory, Following commands, Safety/judgement, Awareness, Problem solving                     Memory: Decreased short-term memory Following Commands: Follows one step commands with increased time, Follows one step commands consistently Safety/Judgement: Decreased awareness of safety, Decreased  awareness of deficits   Problem Solving: Slow processing, Decreased initiation, Requires verbal cues General Comments: Pt constantly asked OT to scratch his back once on EOB.        Exercises      Shoulder Instructions       General Comments      Pertinent Vitals/ Pain       Pain Assessment Pain Assessment: Faces Faces Pain Scale: Hurts little more Pain Location: L hand Pain Descriptors / Indicators: Guarding, Grimacing, Discomfort Pain Intervention(s): Monitored during session, Repositioned  Home Living                                          Prior Functioning/Environment              Frequency  Min 1X/week        Progress Toward Goals  OT Goals(current goals can now be found in the care plan section)  Progress towards OT goals: Progressing toward goals  ADL Goals Additional ADL Goal #1:  (met 1/17) Additional ADL Goal #2: Pt will complete desensitization exercises to tolerate 50% of activites with min A for funtional tasks (goal modified)  Plan      Co-evaluation                 AM-PAC OT "6 Clicks" Daily Activity     Outcome Measure   Help from another person eating meals?: None Help from another person taking care of personal grooming?: A Little Help from another person toileting, which includes using toliet, bedpan, or urinal?: A Lot Help from another person bathing (including washing, rinsing, drying)?: A Lot Help from another person to put on and taking off regular upper body clothing?: A Little Help from another person to put on and taking off regular lower body clothing?: Total 6 Click Score: 15    End of Session    OT Visit Diagnosis: Other abnormalities of gait and mobility (R26.89);Muscle weakness (generalized) (M62.81);Pain Pain - Right/Left: Left Pain - part of body: Arm;Hand   Activity Tolerance Patient limited by fatigue   Patient Left in bed;with call bell/phone within reach;with bed alarm set    Nurse Communication          Time: 9528-4132 OT Time Calculation (min): 21 min  Charges: OT General Charges $OT Visit: 1 Visit OT Treatments $Therapeutic Activity: 8-22 mins    Galen Manila 12/04/2023, 3:32 PM

## 2023-12-04 NOTE — Plan of Care (Signed)

## 2023-12-04 NOTE — Plan of Care (Signed)
  Problem: Education: Goal: Knowledge of General Education information will improve Description: Including pain rating scale, medication(s)/side effects and non-pharmacologic comfort measures Outcome: Progressing   Problem: Health Behavior/Discharge Planning: Goal: Ability to manage health-related needs will improve Outcome: Progressing   Problem: Clinical Measurements: Goal: Ability to maintain clinical measurements within normal limits will improve Outcome: Progressing Goal: Will remain free from infection Outcome: Progressing Goal: Diagnostic test results will improve Outcome: Progressing Goal: Respiratory complications will improve Outcome: Progressing Goal: Cardiovascular complication will be avoided Outcome: Progressing   Problem: Activity: Goal: Risk for activity intolerance will decrease Outcome: Progressing   Problem: Nutrition: Goal: Adequate nutrition will be maintained Outcome: Progressing   Problem: Coping: Goal: Level of anxiety will decrease Outcome: Progressing   Problem: Elimination: Goal: Will not experience complications related to bowel motility Outcome: Progressing Goal: Will not experience complications related to urinary retention Outcome: Progressing   Problem: Pain Management: Goal: General experience of comfort will improve Outcome: Progressing   Problem: Safety: Goal: Ability to remain free from injury will improve Outcome: Progressing   Problem: Skin Integrity: Goal: Risk for impaired skin integrity will decrease Outcome: Progressing   Problem: Education: Goal: Ability to describe self-care measures that may prevent or decrease complications (Diabetes Survival Skills Education) will improve Outcome: Progressing Goal: Individualized Educational Video(s) Outcome: Progressing   Problem: Coping: Goal: Ability to adjust to condition or change in health will improve Outcome: Progressing   Problem: Fluid Volume: Goal: Ability to  maintain a balanced intake and output will improve Outcome: Progressing   Problem: Health Behavior/Discharge Planning: Goal: Ability to identify and utilize available resources and services will improve Outcome: Progressing Goal: Ability to manage health-related needs will improve Outcome: Progressing   Problem: Metabolic: Goal: Ability to maintain appropriate glucose levels will improve Outcome: Progressing   Problem: Nutritional: Goal: Maintenance of adequate nutrition will improve Outcome: Progressing Goal: Progress toward achieving an optimal weight will improve Outcome: Progressing   Problem: Skin Integrity: Goal: Risk for impaired skin integrity will decrease Outcome: Progressing   Problem: Tissue Perfusion: Goal: Adequacy of tissue perfusion will improve Outcome: Progressing   Problem: Safety: Goal: Non-violent Restraint(s) Outcome: Progressing   Problem: Education: Goal: Ability to identify signs and symptoms of gastrointestinal bleeding will improve Outcome: Progressing   Problem: Bowel/Gastric: Goal: Will show no signs and symptoms of gastrointestinal bleeding Outcome: Progressing   Problem: Fluid Volume: Goal: Will show no signs and symptoms of excessive bleeding Outcome: Progressing   Problem: Clinical Measurements: Goal: Complications related to the disease process, condition or treatment will be avoided or minimized Outcome: Progressing

## 2023-12-04 NOTE — Progress Notes (Signed)
   12/04/23 1215  Vitals  Temp 98.6 F (37 C)  Pulse Rate 81  Resp 19  BP 112/73  SpO2 100 %  O2 Device Room Air  Weight 98.5 kg  Type of Weight Post-Dialysis  Oxygen Therapy  Patient Activity (if Appropriate) In bed  Pulse Oximetry Type Continuous  Oximetry Probe Site Changed No  Post Treatment  Dialyzer Clearance Lightly streaked  Hemodialysis Intake (mL) 0 mL  Liters Processed 74  Fluid Removed (mL) 1500 mL  Tolerated HD Treatment Yes   Received patient in bed to unit.  Alert and oriented.  Informed consent signed and in chart.   TX duration:3.0  Patient tolerated well.  Transported back to the room  Alert, without acute distress.  Hand-off given to patient's nurse.   Access used: LUAF Access issues: no complications  Total UF removed: 1500 Medication(s) given: none   [facility]

## 2023-12-04 NOTE — Progress Notes (Signed)
[facility]                                                                              [name] [name], is a 63 y.o. male, DOB - 13-Apr-1961, UVO:[id number] Admit date - 11/08/2023    Outpatient Primary MD for the patient is Pcp, No  LOS - 26  days  Chief Complaint  Patient presents with   Hypotension   Cough   Shortness of Breath       Brief summary   Patient is a 63 y.o. male with PMH significant for ESRD-HD-MWF, DM2, HTN, nonischemic cardiomyopathy, mild systolic CHF, prior right BKA.  Patient presented to ED on 12/22 after experiencing an episode of chest pain, hypotension and near syncope during hemodialysis.  His initial blood pressure was low in 75/59 Troponin was significantly elevated and peaked at 9000, he was diagnosed with NSTEMI, started on heparin drip.  Patient was admitted for further workup.   Hospital course was also complicated altered mental status, influenza, GI bleeding, norovirus diarrhea Patient is currently weak and unable to be in a chair for dialysis. Palliative care consulted.   Assessment & Plan   NSTEMI -Presented with chest pain, hypertension, syncope, significantly elevated troponin  -2D echo 11/09/2023 showed EF 30 -35% with regional WMA, moderate to severe aortic stenosis - Initially started on medical management with IV heparin drip, aspirin and Lipitor -Initial consideration of cardiac cath, was placed on trial of DAPT.  However after single dose of Plavix with aspirin, patient had profuse rectal bleeding, hence DAPT held.  Per cardiology, no plan for inpatient ischemic evaluation.  This can be revisited as outpatient when GI issues stabilized and cleared to utilize DAPT going forward. -Continue atorvastatin -Cleared by GI to place on antiplatelet agent, discussed with cardiology, started on aspirin 81 mg daily.  Outpatient cardiac evaluation planned.  Acute GI bleeding  Norovirus diarrhea -Patient had several days of GI  bleeding, worsened with DAPT -  Bleeding suspected to be combination of norovirus diarrhea, ischemic colitis, no prior colonoscopy.  FOBT positive on 1/3, GI pathogen panel positive for norovirus -CTA GI bleed 1/13 showed no active GI bleeding in the imaging  -GI, Dr. Leonides Schanz following, patient was offered EGD, colonoscopy however declined.   -Per Dr. Leonides Schanz, capsule endoscopy completed, gastritis and duodenitis with small bowel angioectasia, no active bleeding  -Continue PPI twice daily, will check stool H. pylori antigen -Okay to start anticoagulation/antiplatelet agent from GI perspective   Acute exacerbation of combined systolic and diastolic CHF Moderate to severe aortic stenosis -2D echo 12/23 showed EF 30 -35% with regional WMA, grade 3 DD, mildly reduced RV systolic function, moderate to severe aortic valve stenosis  -GDMT limited by ESRD and BP.  Currently on midodrine  -Metoprolol held due to hypotension.   -Cardiology will follow outpatient.  Continue HD for volume management.    ESRD-HD-MWF -Nephrology following -Continue HD per schedule Patient needs to be able to tolerate dialysis in chair to consider outpatient dialysis.   Influenza A positive -RVP positive for flu A + on 12/30.  Completed the course of Tamiflu.   Acute metabolic encephalopathy,  likely due to prolonged delirium -Patient hospital stay was complicated by altered mental status.  There was initial concern for encephalitis vs meningitis. Neurology was consulted.  Patient was placed on broad-spectrum antibiotics -EEG 12/27 and 1/1 did not show any evidence of seizure activity. -MRI brain 1/3 negative for acute abnormality  -LP on 1/3,  CSF labs significant for elevated glucose and protein, not consistent with meningitis. Meningitis/encephalitis panel negative for pathogen. -ID and neurology signed off. -Mental status improving, appears close to his baseline   Type 2 diabetes mellitus uncontrolled -Hemoglobin  A1c > 15.5 on 10/24/2023   CBG (last 3)  Recent Labs    12/03/23 2138 12/04/23 0616 12/04/23 1240  GLUCAP 186* 247* 178*   -Continue sensitive SSI, Semglee 25 units daily, added NovoLog 2 units 3 times daily AC   H/o CVA Continue Lipitor   Estimated body mass index is 28.65 kg/m as calculated from the following:   Height as of this encounter: 6\' 1"  (1.854 m).   Weight as of this encounter: 98.5 kg.  Code Status: Full code DVT Prophylaxis:  heparin injection 5,000 Units Start: 12/04/23 1445 Place and maintain sequential compression device Start: 11/20/23 0706 SCDs Start: 11/09/23 1558   Level of Care: Level of care: Progressive Family Communication:  Disposition Plan:      Remains inpatient appropriate:   Need to be able to tolerate HD in chair for outpatient HD center   Procedures:    Consultants:   Gastroenterology Cardiology Palliative medicine  Antimicrobials:   Anti-infectives (From admission, onward)    Start     Dose/Rate Route Frequency Ordered Stop   11/18/23 1800  oseltamivir (TAMIFLU) capsule 30 mg        30 mg Oral Every M-W-F (Hemodialysis) 11/18/23 1116 11/23/23 1159   11/17/23 1800  oseltamivir (TAMIFLU) capsule 30 mg  Status:  Discontinued        30 mg Oral Every T-Th-Sa (1800) 11/17/23 0800 11/18/23 1116   11/17/23 1115  vancomycin (VANCOCIN) IVPB 1000 mg/200 mL premix  Status:  Discontinued        1,000 mg 200 mL/hr over 60 Minutes Intravenous Once in dialysis 11/17/23 1021 11/18/23 1331   11/17/23 1000  oseltamivir (TAMIFLU) capsule 30 mg        30 mg Oral  Once 11/17/23 0759 11/17/23 0952   11/16/23 1200  vancomycin (VANCOCIN) IVPB 1000 mg/200 mL premix  Status:  Discontinued        1,000 mg 200 mL/hr over 60 Minutes Intravenous Every M-W-F (Hemodialysis) 11/15/23 1658 11/18/23 1306   11/12/23 2300  vancomycin (VANCOCIN) IVPB 1000 mg/200 mL premix        1,000 mg 200 mL/hr over 60 Minutes Intravenous  Once 11/12/23 2226 11/13/23 0131    11/12/23 2000  cefTRIAXone (ROCEPHIN) 2 g in sodium chloride 0.9 % 100 mL IVPB  Status:  Discontinued        2 g 200 mL/hr over 30 Minutes Intravenous Every 12 hours 11/12/23 1627 11/18/23 1306   11/12/23 2000  ampicillin (OMNIPEN) 2 g in sodium chloride 0.9 % 100 mL IVPB  Status:  Discontinued        2 g 300 mL/hr over 20 Minutes Intravenous Every 12 hours 11/12/23 1627 11/18/23 1306   11/12/23 1730  acyclovir (ZOVIRAX) 450 mg in dextrose 5 % 100 mL IVPB  Status:  Discontinued        5 mg/kg  89.6 kg (Adjusted) 109 mL/hr over 60 Minutes Intravenous Every 24  hours 11/12/23 1627 11/18/23 1306   11/11/23 1915  piperacillin-tazobactam (ZOSYN) IVPB 2.25 g  Status:  Discontinued        2.25 g 100 mL/hr over 30 Minutes Intravenous Every 8 hours 11/11/23 1825 11/12/23 1615   11/11/23 1245  metroNIDAZOLE (FLAGYL) IVPB 500 mg  Status:  Discontinued        500 mg 100 mL/hr over 60 Minutes Intravenous Every 12 hours 11/11/23 1146 11/11/23 1825   11/11/23 1200  vancomycin (VANCOCIN) IVPB 1000 mg/200 mL premix  Status:  Discontinued        1,000 mg 200 mL/hr over 60 Minutes Intravenous Every M-W-F (Hemodialysis) 11/10/23 1458 11/15/23 1658   11/10/23 1545  vancomycin (VANCOCIN) IVPB 1000 mg/200 mL premix       Placed in "Followed by" Linked Group   1,000 mg 200 mL/hr over 60 Minutes Intravenous  Once 11/10/23 1458 11/10/23 1917   11/10/23 1545  vancomycin (VANCOCIN) IVPB 1000 mg/200 mL premix       Placed in "Followed by" Linked Group   1,000 mg 200 mL/hr over 60 Minutes Intravenous  Once 11/10/23 1458 11/10/23 1815   11/10/23 1545  ceFEPIme (MAXIPIME) 1 g in sodium chloride 0.9 % 100 mL IVPB  Status:  Discontinued        1 g 200 mL/hr over 30 Minutes Intravenous Every M-W-F 11/10/23 1458 11/10/23 1520   11/10/23 1545  ceFEPIme (MAXIPIME) 2 g in sodium chloride 0.9 % 100 mL IVPB  Status:  Discontinued        2 g 200 mL/hr over 30 Minutes Intravenous Every M-W-F 11/10/23 1520 11/11/23 1810           Medications  aspirin EC  81 mg Oral Daily   atorvastatin  40 mg Oral Daily   Chlorhexidine Gluconate Cloth  6 each Topical Q0600   cinacalcet  30 mg Oral Q breakfast   darbepoetin (ARANESP) injection - DIALYSIS  100 mcg Subcutaneous Q Mon-1800   doxercalciferol  3 mcg Intravenous Q M,W,F-HD   feeding supplement  237 mL Oral BID BM   heparin injection (subcutaneous)  5,000 Units Subcutaneous Q8H   insulin aspart  0-5 Units Subcutaneous QHS   insulin aspart  0-9 Units Subcutaneous TID WC   insulin glargine-yfgn  25 Units Subcutaneous Daily   midodrine  10 mg Oral TID WC   multivitamin  1 tablet Oral QHS   pantoprazole  40 mg Oral BID   sevelamer carbonate  4,000 mg Oral TID WC   thiamine  100 mg Oral Daily      Subjective:   Rikhil Bielby was seen and examined today.  No acute issues, no chest pain, nausea vomiting, fevers.    Objective:   Vitals:   12/04/23 1130 12/04/23 1200 12/04/23 1215 12/04/23 1235  BP: (!) 100/57 (!) 106/53 112/73 (!) 93/52  Pulse: 81 81 81 83  Resp: 12 (!) 21 19 16   Temp:   98.6 F (37 C) 98.6 F (37 C)  TempSrc:    Oral  SpO2: 100% 100% 100% 93%  Weight:   98.5 kg   Height:        Intake/Output Summary (Last 24 hours) at 12/04/2023 1453 Last data filed at 12/04/2023 1215 Gross per 24 hour  Intake 270 ml  Output 1500 ml  Net -1230 ml     Wt Readings from Last 3 Encounters:  12/04/23 98.5 kg  10/26/23 110.5 kg  10/22/23 108.9 kg    Physical Exam General:  Alert and oriented x 3, NAD Cardiovascular: S1 S2 clear, RRR.  Respiratory: CTAB Gastrointestinal: Soft, NT, ND, NBS Ext: no pedal edema Neuro: no new deficits    Data Reviewed:  I have personally reviewed following labs    CBC Lab Results  Component Value Date   WBC 8.0 12/04/2023   RBC 3.30 (L) 12/04/2023   HGB 9.5 (L) 12/04/2023   HCT 30.1 (L) 12/04/2023   MCV 91.2 12/04/2023   MCH 28.8 12/04/2023   PLT 253 12/04/2023   MCHC 31.6 12/04/2023   RDW 16.4 (H)  12/04/2023   LYMPHSABS 1.8 11/30/2023   MONOABS 1.7 (H) 11/30/2023   EOSABS 0.4 11/30/2023   BASOSABS 0.1 11/30/2023     Last metabolic panel Lab Results  Component Value Date   NA 132 (L) 12/04/2023   K 5.4 (H) 12/04/2023   CL 97 (L) 12/04/2023   CO2 24 12/04/2023   BUN 30 (H) 12/04/2023   CREATININE 9.49 (H) 12/04/2023   GLUCOSE 259 (H) 12/04/2023   GFRNONAA 6 (L) 12/04/2023   GFRAA 3 (L) 08/14/2019   CALCIUM 9.7 12/04/2023   PHOS 8.0 (H) 11/23/2023   PROT 6.9 12/03/2023   ALBUMIN 2.8 (L) 12/03/2023   LABGLOB 5.1 (H) 12/17/2022   AGRATIO 0.7 11/20/2022   BILITOT 0.7 12/03/2023   ALKPHOS 88 12/03/2023   AST 35 12/03/2023   ALT 21 12/03/2023   ANIONGAP 11 12/04/2023    CBG (last 3)  Recent Labs    12/03/23 2138 12/04/23 0616 12/04/23 1240  GLUCAP 186* 247* 178*      Coagulation Profile: No results for input(s): "INR", "PROTIME" in the last 168 hours.   Radiology Studies: I have personally reviewed the imaging studies  No results found.      Thad Ranger M.D. [facility] 12/04/2023, 2:53 PM  Available via Epic secure chat 7am-7pm After 7 pm, please refer to night coverage provider listed on amion.

## 2023-12-04 NOTE — Procedures (Signed)
I was present at this dialysis session. I have reviewed the session itself and made appropriate changes.   K of 5.4 will use 2K for treatment.  Hb 9.5.  UF goal of 1.5L.  Pt w/o complaint.    VCE with limited results but gastritis and duodenitis noted along with ? Esophagieal stricture or motility issue. Pt declining EGD and CSY.  Filed Weights   12/03/23 0456 12/04/23 0408 12/04/23 0900  Weight: 108 kg 101.1 kg 101 kg    Recent Labs  Lab 12/04/23 0304  NA 132*  K 5.4*  CL 97*  CO2 24  GLUCOSE 259*  BUN 30*  CREATININE 9.49*  CALCIUM 9.7    Recent Labs  Lab 11/30/23 0249 12/01/23 0246 12/02/23 1144 12/03/23 0246 12/04/23 0304  WBC 8.9   < > 6.9 6.8 8.0  NEUTROABS 4.9  --   --   --   --   HGB 9.4*   < > 9.9* 9.9* 9.5*  HCT 28.9*   < > 30.8* 31.7* 30.1*  MCV 87.8   < > 90.1 91.4 91.2  PLT 275   < > 258 241 253   < > = values in this interval not displayed.    Scheduled Meds:  aspirin EC  81 mg Oral Daily   atorvastatin  40 mg Oral Daily   Chlorhexidine Gluconate Cloth  6 each Topical Q0600   cinacalcet  30 mg Oral Q breakfast   darbepoetin (ARANESP) injection - DIALYSIS  100 mcg Subcutaneous Q Mon-1800   doxercalciferol  3 mcg Intravenous Q M,W,F-HD   feeding supplement  237 mL Oral BID BM   insulin aspart  0-5 Units Subcutaneous QHS   insulin aspart  0-9 Units Subcutaneous TID WC   insulin glargine-yfgn  25 Units Subcutaneous Daily   midodrine  10 mg Oral TID WC   multivitamin  1 tablet Oral QHS   pantoprazole  40 mg Oral BID   sevelamer carbonate  4,000 mg Oral TID WC   thiamine  100 mg Oral Daily   Continuous Infusions:  iron sucrose     PRN Meds:.acetaminophen **OR** acetaminophen, iohexol, ondansetron **OR** ondansetron (ZOFRAN) IV, mouth rinse, sevelamer carbonate   Sabra Heck  MD 12/04/2023, 9:31 AM

## 2023-12-05 DIAGNOSIS — N186 End stage renal disease: Secondary | ICD-10-CM | POA: Diagnosis not present

## 2023-12-05 DIAGNOSIS — I214 Non-ST elevation (NSTEMI) myocardial infarction: Secondary | ICD-10-CM | POA: Diagnosis not present

## 2023-12-05 DIAGNOSIS — I9589 Other hypotension: Secondary | ICD-10-CM | POA: Diagnosis not present

## 2023-12-05 DIAGNOSIS — I5023 Acute on chronic systolic (congestive) heart failure: Secondary | ICD-10-CM | POA: Diagnosis not present

## 2023-12-05 LAB — BASIC METABOLIC PANEL
Anion gap: 8 (ref 5–15)
BUN: 26 mg/dL — ABNORMAL HIGH (ref 8–23)
CO2: 26 mmol/L (ref 22–32)
Calcium: 9.8 mg/dL (ref 8.9–10.3)
Chloride: 98 mmol/L (ref 98–111)
Creatinine, Ser: 8.06 mg/dL — ABNORMAL HIGH (ref 0.61–1.24)
GFR, Estimated: 7 mL/min — ABNORMAL LOW (ref >60)
Glucose, Bld: 245 mg/dL — ABNORMAL HIGH (ref 70–99)
Potassium: 5.3 mmol/L — ABNORMAL HIGH (ref 3.5–5.1)
Sodium: 132 mmol/L — ABNORMAL LOW (ref 135–145)

## 2023-12-05 LAB — GLUCOSE, CAPILLARY
Glucose-Capillary: 199 mg/dL — ABNORMAL HIGH (ref 70–99)
Glucose-Capillary: 134 mg/dL — ABNORMAL HIGH (ref 70–99)
Glucose-Capillary: 108 mg/dL — ABNORMAL HIGH (ref 70–99)
Glucose-Capillary: 147 mg/dL — ABNORMAL HIGH (ref 70–99)

## 2023-12-05 LAB — CBC
HCT: 31.7 % — ABNORMAL LOW (ref 39.0–52.0)
Hemoglobin: 10 g/dL — ABNORMAL LOW (ref 13.0–17.0)
MCH: 29.2 pg (ref 26.0–34.0)
MCHC: 31.5 g/dL (ref 30.0–36.0)
MCV: 92.7 fL (ref 80.0–100.0)
Platelets: 230 10*3/uL (ref 150–400)
RBC: 3.42 MIL/uL — ABNORMAL LOW (ref 4.22–5.81)
RDW: 16.8 % — ABNORMAL HIGH (ref 11.5–15.5)
WBC: 8 10*3/uL (ref 4.0–10.5)
nRBC: 0 % (ref 0.0–0.2)

## 2023-12-05 NOTE — Plan of Care (Signed)

## 2023-12-05 NOTE — Progress Notes (Signed)
[name] [name] Progress Note   Subjective:    No interval events HD yesterday 1.5L UF Nursing reports that he sat in a chair for about 3 hours yesterday He has no complaints this morning, seen in the room  Objective Vitals:   12/05/23 0140 12/05/23 0442 12/05/23 0513 12/05/23 0800  BP: 122/73  118/77 98/73  Pulse: 81  84 87  Resp: 18  18 18   Temp: 98.1 F (36.7 C)  98.3 F (36.8 C) 98.4 F (36.9 C)  TempSrc: Oral  Oral Oral  SpO2: 97%  98% 94%  Weight:  101.7 kg    Height:       Physical Exam General:chronically ill appearing male in NAD; lying in bed Heart:RRR,+3/6 systolic murmur Lungs:CTAB, nml WOB on RA Abdomen:soft, NTND Extremities:no LE edema, R BKA Dialysis Access: LU AVF +b/t   Filed Weights   12/04/23 0900 12/04/23 1215 12/05/23 0442  Weight: 101 kg 98.5 kg 101.7 kg    Intake/Output Summary (Last 24 hours) at 12/05/2023 1000 Last data filed at 12/04/2023 2231 Gross per 24 hour  Intake 100 ml  Output 1500 ml  Net -1400 ml    Additional Objective Labs: Basic Metabolic Panel: Recent Labs  Lab 12/03/23 0246 12/04/23 0304 12/05/23 0246  NA 131* 132* 132*  K 4.9 5.4* 5.3*  CL 96* 97* 98  CO2 24 24 26   GLUCOSE 263* 259* 245*  BUN 22 30* 26*  CREATININE 7.81* 9.49* 8.06*  CALCIUM 8.8* 9.7 9.8   Liver Function Tests: Recent Labs  Lab 12/02/23 1144 12/03/23 0246  AST 27 35  ALT 20 21  ALKPHOS 90 88  BILITOT 0.6 0.7  PROT 7.2 6.9  ALBUMIN 2.9* 2.8*     CBC: Recent Labs  Lab 11/30/23 0249 12/01/23 0246 12/02/23 1144 12/03/23 0246 12/04/23 0304 12/05/23 0246  WBC 8.9 7.0 6.9 6.8 8.0 8.0  NEUTROABS 4.9  --   --   --   --   --   HGB 9.4* 9.8* 9.9* 9.9* 9.5* 10.0*  HCT 28.9* 30.4* 30.8* 31.7* 30.1* 31.7*  MCV 87.8 89.4 90.1 91.4 91.2 92.7  PLT 275 247 258 241 253 230   Blood Culture    Component Value Date/Time   SDES CSF 11/19/2023 1134   SPECREQUEST NONE 11/19/2023 1134   CULT  11/19/2023 1134    NO GROWTH 3  DAYS Performed at [facility], [address], [place], [place] [address]    REPTSTATUS 11/23/2023 FINAL 11/19/2023 1134    CBG: Recent Labs  Lab 12/04/23 0616 12/04/23 1240 12/04/23 1616 12/04/23 2035 12/05/23 0659  GLUCAP 247* 178* 220* 283* 199*    Medications:     aspirin EC  81 mg Oral Daily   atorvastatin  40 mg Oral Daily   Chlorhexidine Gluconate Cloth  6 each Topical Q0600   cinacalcet  30 mg Oral Q breakfast   darbepoetin (ARANESP) injection - DIALYSIS  100 mcg Subcutaneous Q Mon-1800   doxercalciferol  3 mcg Intravenous Q M,W,F-HD   feeding supplement  237 mL Oral BID BM   heparin injection (subcutaneous)  5,000 Units Subcutaneous Q8H   insulin aspart  0-5 Units Subcutaneous QHS   insulin aspart  0-9 Units Subcutaneous TID WC   insulin aspart  2 Units Subcutaneous TID WC   insulin glargine-yfgn  25 Units Subcutaneous Daily   midodrine  10 mg Oral TID WC   multivitamin  1 tablet Oral QHS   pantoprazole  40 mg  Oral BID   sevelamer carbonate  4,000 mg Oral TID WC   thiamine  100 mg Oral Daily    Dialysis Orders: [facility] MWF 4h  450/700  105kg   2/2 bath   AVF   Heparin 7000 + 2000 midrun - Hectorol 7 mcg IV three times per week   Assessment/Plan: 1.AMS: Was on cefepime which has been stopped. Per primary team some concern for encephalitis/meningitis, was on empiric antibiotics/antivirals until seen by ID on 11/18/23 and all was stopped.  S/P LP which ruled out bacterail meningitis/encephalitis. He did test + for flu A by resp panel. MRI without acute abnormality.  ID stopped abx and antivirals and signed off. Getting trial of high-dose thiamine. Mental status has sig improved - Much improved 2.  ESRD - usual HD is MWF. Next HD 12/06/22 maybe ready for a chair?  Follow-up tomorrow 3. NSTEMI- work up per cardiology/primary. No plan for invasive procedure and not DAPT candidate per cardiology.  Cardiology signed off.  4. Volume - under  EDW. Will need lower dry weight on d/c. UF as tolerated.  5. Fever/Hypotension- BC with NGTD. Was started on board spectrum antibiotics/antivirals but now discontinued.  ID signed off. 6. HFrEF/history of severe AS- attempting to optimize volume with dialysis. ECHO  EF 30 to 35%. Seen by Cards - medical management of AS. Fluid restrictions and daily weights. On midodrine 10 mg 3 times daily.  7. Anemia  - Hgb 9.9 now stable and GI Following.  ESA 1/13. Start Fe load today 1/14. As per below 8. Metabolic bone disease -Continue renvela. VDRA on hold d/t high calcium; coming down. 9. Debility - memory is better however he remains debilitated.Needs to demonstrate HD in chair for return to outpt HD. Not there yet.... 10. GIB/Norovirus - C diff neg. GI following. Bloody BMs reported.  1 unit pRBC given 11/26/22.  Completed capsule endoscopy  Arita Miss, MD  [name] 12/05/2023,10:00 AM  LOS: 27 days

## 2023-12-05 NOTE — Progress Notes (Signed)
[facility]                                                                              [name] [name], is a 63 y.o. male, DOB - Nov 16, 1961, MVH:[id number] Admit date - 11/08/2023    Outpatient Primary MD for the patient is Pcp, No  LOS - 27  days  Chief Complaint  Patient presents with   Hypotension   Cough   Shortness of Breath       Brief summary   Patient is a 63 y.o. male with PMH significant for ESRD-HD-MWF, DM2, HTN, nonischemic cardiomyopathy, mild systolic CHF, prior right BKA.  Patient presented to ED on 12/22 after experiencing an episode of chest pain, hypotension and near syncope during hemodialysis.  His initial blood pressure was low in 75/59 Troponin was significantly elevated and peaked at 9000, he was diagnosed with NSTEMI, started on heparin drip.  Patient was admitted for further workup.   Hospital course was also complicated altered mental status, influenza, GI bleeding, norovirus diarrhea Patient is currently weak and unable to be in a chair for dialysis. Palliative care consulted.   Assessment & Plan   NSTEMI -Presented with chest pain, hypertension, syncope, significantly elevated troponin  -2D echo 11/09/2023 showed EF 30 -35% with regional WMA, moderate to severe aortic stenosis - Initially started on medical management with IV heparin drip, aspirin and Lipitor -Initial consideration of cardiac cath, was placed on trial of DAPT.  However after single dose of Plavix with aspirin, patient had profuse rectal bleeding, hence DAPT was held.  Per cardiology, no plan for inpatient ischemic evaluation.  This can be revisited as outpatient when GI issues stabilized and cleared to utilize DAPT going forward. -Cleared by GI to place on antiplatelet agent,  -Per cardiology, started on aspirin 81 mg daily, outpatient cardiac evaluation planned.  Continue statin  Acute GI bleeding  Norovirus diarrhea -Patient had several days of GI bleeding,  worsened with DAPT - Bleeding suspected to be combination of norovirus diarrhea, ischemic colitis, no prior colonoscopy.  FOBT positive on 1/3, GI pathogen panel positive for norovirus -CTA GI bleed 1/13 showed no active GI bleeding in the imaging  -Patient was offered EGD, colonoscopy, he declined. -Per GI, Dr. Leonides Schanz, capsule endoscopy completed, gastritis and duodenitis with small bowel angioectasia, no active bleeding  -Continue PPI, okay to start AC/antiplatelet agent from GI perspective   Acute exacerbation of combined systolic and diastolic CHF Moderate to severe aortic stenosis -2D echo 12/23 showed EF 30 -35% with regional WMA, grade 3 DD, mildly reduced RV systolic function, moderate to severe aortic valve stenosis  -GDMT limited by ESRD and BP.  Currently on midodrine  -Metoprolol held due to hypotension.   -Cardiology will follow outpatient.  Continue HD for volume management.    ESRD-HD-MWF -Nephrology following -Continue HD per schedule Patient needs to be able to tolerate dialysis in chair to consider outpatient dialysis.   Influenza A positive -RVP positive for flu A + on 12/30.  Completed the course of Tamiflu.   Acute metabolic encephalopathy, likely due to prolonged delirium -Patient hospital stay was complicated by altered mental  status.  There was initial concern for encephalitis vs meningitis. Neurology was consulted.  Patient was placed on broad-spectrum antibiotics -EEG 12/27 and 1/1 did not show any evidence of seizure activity. -MRI brain 1/3 negative for acute abnormality  -LP on 1/3,  CSF labs significant for elevated glucose and protein, not consistent with meningitis. Meningitis/encephalitis panel negative for pathogen. -ID and neurology signed off. -Mental status improving, appears close to his baseline   Type 2 diabetes mellitus uncontrolled -Hemoglobin A1c > 15.5 on 10/24/2023   CBG (last 3)  Recent Labs    12/04/23 2035 12/05/23 0659  12/05/23 1142  GLUCAP 283* 199* 134*   -Continue sensitive SSI, Semglee 25 units daily, added NovoLog 2 units 3 times daily AC   H/o CVA Continue Lipitor   Estimated body mass index is 29.58 kg/m as calculated from the following:   Height as of this encounter: 6\' 1"  (1.854 m).   Weight as of this encounter: 101.7 kg.  Code Status: Full code DVT Prophylaxis:  heparin injection 5,000 Units Start: 12/04/23 1445 Place and maintain sequential compression device Start: 11/20/23 0706 SCDs Start: 11/09/23 1558   Level of Care: Level of care: Progressive Family Communication:  Disposition Plan:      Remains inpatient appropriate:   Need to be able to tolerate HD in chair for outpatient HD center   Procedures:    Consultants:   Gastroenterology Cardiology Palliative medicine  Antimicrobials:   Anti-infectives (From admission, onward)    Start     Dose/Rate Route Frequency Ordered Stop   11/18/23 1800  oseltamivir (TAMIFLU) capsule 30 mg        30 mg Oral Every M-W-F (Hemodialysis) 11/18/23 1116 11/23/23 1159   11/17/23 1800  oseltamivir (TAMIFLU) capsule 30 mg  Status:  Discontinued        30 mg Oral Every T-Th-Sa (1800) 11/17/23 0800 11/18/23 1116   11/17/23 1115  vancomycin (VANCOCIN) IVPB 1000 mg/200 mL premix  Status:  Discontinued        1,000 mg 200 mL/hr over 60 Minutes Intravenous Once in dialysis 11/17/23 1021 11/18/23 1331   11/17/23 1000  oseltamivir (TAMIFLU) capsule 30 mg        30 mg Oral  Once 11/17/23 0759 11/17/23 0952   11/16/23 1200  vancomycin (VANCOCIN) IVPB 1000 mg/200 mL premix  Status:  Discontinued        1,000 mg 200 mL/hr over 60 Minutes Intravenous Every M-W-F (Hemodialysis) 11/15/23 1658 11/18/23 1306   11/12/23 2300  vancomycin (VANCOCIN) IVPB 1000 mg/200 mL premix        1,000 mg 200 mL/hr over 60 Minutes Intravenous  Once 11/12/23 2226 11/13/23 0131   11/12/23 2000  cefTRIAXone (ROCEPHIN) 2 g in sodium chloride 0.9 % 100 mL IVPB  Status:   Discontinued        2 g 200 mL/hr over 30 Minutes Intravenous Every 12 hours 11/12/23 1627 11/18/23 1306   11/12/23 2000  ampicillin (OMNIPEN) 2 g in sodium chloride 0.9 % 100 mL IVPB  Status:  Discontinued        2 g 300 mL/hr over 20 Minutes Intravenous Every 12 hours 11/12/23 1627 11/18/23 1306   11/12/23 1730  acyclovir (ZOVIRAX) 450 mg in dextrose 5 % 100 mL IVPB  Status:  Discontinued        5 mg/kg  89.6 kg (Adjusted) 109 mL/hr over 60 Minutes Intravenous Every 24 hours 11/12/23 1627 11/18/23 1306   11/11/23 1915  piperacillin-tazobactam (ZOSYN) IVPB  2.25 g  Status:  Discontinued        2.25 g 100 mL/hr over 30 Minutes Intravenous Every 8 hours 11/11/23 1825 11/12/23 1615   11/11/23 1245  metroNIDAZOLE (FLAGYL) IVPB 500 mg  Status:  Discontinued        500 mg 100 mL/hr over 60 Minutes Intravenous Every 12 hours 11/11/23 1146 11/11/23 1825   11/11/23 1200  vancomycin (VANCOCIN) IVPB 1000 mg/200 mL premix  Status:  Discontinued        1,000 mg 200 mL/hr over 60 Minutes Intravenous Every M-W-F (Hemodialysis) 11/10/23 1458 11/15/23 1658   11/10/23 1545  vancomycin (VANCOCIN) IVPB 1000 mg/200 mL premix       Placed in "Followed by" Linked Group   1,000 mg 200 mL/hr over 60 Minutes Intravenous  Once 11/10/23 1458 11/10/23 1917   11/10/23 1545  vancomycin (VANCOCIN) IVPB 1000 mg/200 mL premix       Placed in "Followed by" Linked Group   1,000 mg 200 mL/hr over 60 Minutes Intravenous  Once 11/10/23 1458 11/10/23 1815   11/10/23 1545  ceFEPIme (MAXIPIME) 1 g in sodium chloride 0.9 % 100 mL IVPB  Status:  Discontinued        1 g 200 mL/hr over 30 Minutes Intravenous Every M-W-F 11/10/23 1458 11/10/23 1520   11/10/23 1545  ceFEPIme (MAXIPIME) 2 g in sodium chloride 0.9 % 100 mL IVPB  Status:  Discontinued        2 g 200 mL/hr over 30 Minutes Intravenous Every M-W-F 11/10/23 1520 11/11/23 1810          Medications  aspirin EC  81 mg Oral Daily   atorvastatin  40 mg Oral Daily    Chlorhexidine Gluconate Cloth  6 each Topical Q0600   cinacalcet  30 mg Oral Q breakfast   darbepoetin (ARANESP) injection - DIALYSIS  100 mcg Subcutaneous Q Mon-1800   doxercalciferol  3 mcg Intravenous Q M,W,F-HD   feeding supplement  237 mL Oral BID BM   heparin injection (subcutaneous)  5,000 Units Subcutaneous Q8H   insulin aspart  0-5 Units Subcutaneous QHS   insulin aspart  0-9 Units Subcutaneous TID WC   insulin aspart  2 Units Subcutaneous TID WC   insulin glargine-yfgn  25 Units Subcutaneous Daily   midodrine  10 mg Oral TID WC   multivitamin  1 tablet Oral QHS   pantoprazole  40 mg Oral BID   sevelamer carbonate  4,000 mg Oral TID WC   thiamine  100 mg Oral Daily      Subjective:   [name] [name] was seen and examined today.  No complaints per patient.  Did not want to eat breakfast, poor appetite this morning.  No pain, nausea vomiting, abdominal pain, fevers..    Objective:   Vitals:   12/05/23 0442 12/05/23 0513 12/05/23 0800 12/05/23 1139  BP:  118/77 98/73 (!) 124/54  Pulse:  84 87 81  Resp:  18 18 18   Temp:  98.3 F (36.8 C) 98.4 F (36.9 C) 98.5 F (36.9 C)  TempSrc:  Oral Oral Oral  SpO2:  98% 94% 90%  Weight: 101.7 kg     Height:        Intake/Output Summary (Last 24 hours) at 12/05/2023 1324 Last data filed at 12/04/2023 2231 Gross per 24 hour  Intake 100 ml  Output --  Net 100 ml     Wt Readings from Last 3 Encounters:  12/05/23 101.7 kg  10/26/23 110.5 kg  10/22/23 108.9 kg   Physical Exam General: Alert and oriented x 3, NAD Cardiovascular: S1 S2 clear, RRR.  Respiratory: CTAB Gastrointestinal: Soft, nontender, nondistended, NBS Ext: no pedal edema bilaterally Neuro: no new deficits Psych: flat affect    Data Reviewed:  I have personally reviewed following labs    CBC Lab Results  Component Value Date   WBC 8.0 12/05/2023   RBC 3.42 (L) 12/05/2023   HGB 10.0 (L) 12/05/2023   HCT 31.7 (L) 12/05/2023   MCV 92.7 12/05/2023    MCH 29.2 12/05/2023   PLT 230 12/05/2023   MCHC 31.5 12/05/2023   RDW 16.8 (H) 12/05/2023   LYMPHSABS 1.8 11/30/2023   MONOABS 1.7 (H) 11/30/2023   EOSABS 0.4 11/30/2023   BASOSABS 0.1 11/30/2023     Last metabolic panel Lab Results  Component Value Date   NA 132 (L) 12/05/2023   K 5.3 (H) 12/05/2023   CL 98 12/05/2023   CO2 26 12/05/2023   BUN 26 (H) 12/05/2023   CREATININE 8.06 (H) 12/05/2023   GLUCOSE 245 (H) 12/05/2023   GFRNONAA 7 (L) 12/05/2023   GFRAA 3 (L) 08/14/2019   CALCIUM 9.8 12/05/2023   PHOS 8.0 (H) 11/23/2023   PROT 6.9 12/03/2023   ALBUMIN 2.8 (L) 12/03/2023   LABGLOB 5.1 (H) 12/17/2022   AGRATIO 0.7 11/20/2022   BILITOT 0.7 12/03/2023   ALKPHOS 88 12/03/2023   AST 35 12/03/2023   ALT 21 12/03/2023   ANIONGAP 8 12/05/2023    CBG (last 3)  Recent Labs    12/04/23 2035 12/05/23 0659 12/05/23 1142  GLUCAP 283* 199* 134*      Coagulation Profile: No results for input(s): "INR", "PROTIME" in the last 168 hours.   Radiology Studies: I have personally reviewed the imaging studies  No results found.      Thad Ranger M.D. [facility] 12/05/2023, 1:24 PM  Available via Epic secure chat 7am-7pm After 7 pm, please refer to night coverage provider listed on amion.

## 2023-12-05 NOTE — Plan of Care (Signed)
  Problem: Education: Goal: Knowledge of General Education information will improve Description: Including pain rating scale, medication(s)/side effects and non-pharmacologic comfort measures Outcome: Progressing   Problem: Health Behavior/Discharge Planning: Goal: Ability to manage health-related needs will improve Outcome: Progressing   Problem: Clinical Measurements: Goal: Ability to maintain clinical measurements within normal limits will improve Outcome: Progressing Goal: Will remain free from infection Outcome: Progressing Goal: Diagnostic test results will improve Outcome: Progressing Goal: Respiratory complications will improve Outcome: Progressing Goal: Cardiovascular complication will be avoided Outcome: Progressing   Problem: Activity: Goal: Risk for activity intolerance will decrease Outcome: Progressing   Problem: Nutrition: Goal: Adequate nutrition will be maintained Outcome: Progressing   Problem: Coping: Goal: Level of anxiety will decrease Outcome: Progressing   Problem: Elimination: Goal: Will not experience complications related to bowel motility Outcome: Progressing Goal: Will not experience complications related to urinary retention Outcome: Progressing   Problem: Pain Management: Goal: General experience of comfort will improve Outcome: Progressing   Problem: Safety: Goal: Ability to remain free from injury will improve Outcome: Progressing   Problem: Skin Integrity: Goal: Risk for impaired skin integrity will decrease Outcome: Progressing   Problem: Education: Goal: Ability to describe self-care measures that may prevent or decrease complications (Diabetes Survival Skills Education) will improve Outcome: Progressing Goal: Individualized Educational Video(s) Outcome: Progressing   Problem: Coping: Goal: Ability to adjust to condition or change in health will improve Outcome: Progressing   Problem: Fluid Volume: Goal: Ability to  maintain a balanced intake and output will improve Outcome: Progressing   Problem: Health Behavior/Discharge Planning: Goal: Ability to identify and utilize available resources and services will improve Outcome: Progressing Goal: Ability to manage health-related needs will improve Outcome: Progressing   Problem: Metabolic: Goal: Ability to maintain appropriate glucose levels will improve Outcome: Progressing   Problem: Nutritional: Goal: Maintenance of adequate nutrition will improve Outcome: Progressing Goal: Progress toward achieving an optimal weight will improve Outcome: Progressing   Problem: Skin Integrity: Goal: Risk for impaired skin integrity will decrease Outcome: Progressing   Problem: Tissue Perfusion: Goal: Adequacy of tissue perfusion will improve Outcome: Progressing   Problem: Safety: Goal: Non-violent Restraint(s) Outcome: Progressing   Problem: Education: Goal: Ability to identify signs and symptoms of gastrointestinal bleeding will improve Outcome: Progressing   Problem: Bowel/Gastric: Goal: Will show no signs and symptoms of gastrointestinal bleeding Outcome: Progressing   Problem: Fluid Volume: Goal: Will show no signs and symptoms of excessive bleeding Outcome: Progressing   Problem: Clinical Measurements: Goal: Complications related to the disease process, condition or treatment will be avoided or minimized Outcome: Progressing

## 2023-12-06 ENCOUNTER — Inpatient Hospital Stay (HOSPITAL_COMMUNITY): Payer: Medicare Other

## 2023-12-06 ENCOUNTER — Encounter (HOSPITAL_COMMUNITY): Payer: Self-pay | Admitting: Internal Medicine

## 2023-12-06 DIAGNOSIS — I5023 Acute on chronic systolic (congestive) heart failure: Secondary | ICD-10-CM | POA: Diagnosis not present

## 2023-12-06 DIAGNOSIS — I10 Essential (primary) hypertension: Secondary | ICD-10-CM | POA: Diagnosis not present

## 2023-12-06 DIAGNOSIS — N186 End stage renal disease: Secondary | ICD-10-CM | POA: Diagnosis not present

## 2023-12-06 DIAGNOSIS — I214 Non-ST elevation (NSTEMI) myocardial infarction: Secondary | ICD-10-CM | POA: Diagnosis not present

## 2023-12-06 LAB — CBC
HCT: 30.4 % — ABNORMAL LOW (ref 39.0–52.0)
Hemoglobin: 9.7 g/dL — ABNORMAL LOW (ref 13.0–17.0)
MCH: 29.4 pg (ref 26.0–34.0)
MCHC: 31.9 g/dL (ref 30.0–36.0)
MCV: 92.1 fL (ref 80.0–100.0)
Platelets: 264 10*3/uL (ref 150–400)
RBC: 3.3 MIL/uL — ABNORMAL LOW (ref 4.22–5.81)
RDW: 17.2 % — ABNORMAL HIGH (ref 11.5–15.5)
WBC: 9.2 10*3/uL (ref 4.0–10.5)
nRBC: 0 % (ref 0.0–0.2)

## 2023-12-06 LAB — BASIC METABOLIC PANEL
Anion gap: 11 (ref 5–15)
BUN: 32 mg/dL — ABNORMAL HIGH (ref 8–23)
CO2: 24 mmol/L (ref 22–32)
Calcium: 10.1 mg/dL (ref 8.9–10.3)
Chloride: 97 mmol/L — ABNORMAL LOW (ref 98–111)
Creatinine, Ser: 10.01 mg/dL — ABNORMAL HIGH (ref 0.61–1.24)
GFR, Estimated: 5 mL/min — ABNORMAL LOW (ref >60)
Glucose, Bld: 177 mg/dL — ABNORMAL HIGH (ref 70–99)
Potassium: 5.4 mmol/L — ABNORMAL HIGH (ref 3.5–5.1)
Sodium: 132 mmol/L — ABNORMAL LOW (ref 135–145)

## 2023-12-06 LAB — GLUCOSE, CAPILLARY
Glucose-Capillary: 148 mg/dL — ABNORMAL HIGH (ref 70–99)
Glucose-Capillary: 109 mg/dL — ABNORMAL HIGH (ref 70–99)
Glucose-Capillary: 199 mg/dL — ABNORMAL HIGH (ref 70–99)
Glucose-Capillary: 150 mg/dL — ABNORMAL HIGH (ref 70–99)

## 2023-12-06 LAB — H. PYLORI ANTIGEN, STOOL: H. Pylori Stool Ag, Eia: NEGATIVE

## 2023-12-06 MED ORDER — SODIUM ZIRCONIUM CYCLOSILICATE 10 G PO PACK
10.0000 g | PACK | Freq: Two times a day (BID) | ORAL | Status: AC
  Administered 2023-12-06 (×2): 10 g via ORAL
  Filled 2023-12-06 (×2): qty 1

## 2023-12-06 NOTE — Progress Notes (Signed)
Dr. Isidoro Donning at bedside. Notified MD that patient refused both insulins (Novolog and long-acting), Lipitor, and Renvela. No further order received at this time.

## 2023-12-06 NOTE — Progress Notes (Signed)
[facility] Progress Note   Subjective:    No interval events Looks like didn't get out of bed yesterday  Objective Vitals:   12/06/23 0019 12/06/23 0450 12/06/23 0526 12/06/23 0816  BP: 121/63 123/70  118/64  Pulse: 86 82  80  Resp: 17 18  18   Temp: 98.8 F (37.1 C) 98.4 F (36.9 C)  98.6 F (37 C)  TempSrc: Oral Oral  Oral  SpO2: 97% 97%  97%  Weight:   103 kg   Height:       Physical Exam General:chronically ill appearing male in NAD; lying in bed Heart:RRR,+3/6 systolic murmur Lungs:CTAB, nml WOB on RA Abdomen:soft, NTND Extremities:no LE edema, R BKA Dialysis Access: LU AVF +b/t   Filed Weights   12/04/23 1215 12/05/23 0442 12/06/23 0526  Weight: 98.5 kg 101.7 kg 103 kg    Intake/Output Summary (Last 24 hours) at 12/06/2023 1041 Last data filed at 12/05/2023 2236 Gross per 24 hour  Intake 150 ml  Output --  Net 150 ml    Additional Objective Labs: Basic Metabolic Panel: Recent Labs  Lab 12/04/23 0304 12/05/23 0246 12/06/23 0224  NA 132* 132* 132*  K 5.4* 5.3* 5.4*  CL 97* 98 97*  CO2 24 26 24   GLUCOSE 259* 245* 177*  BUN 30* 26* 32*  CREATININE 9.49* 8.06* 10.01*  CALCIUM 9.7 9.8 10.1   Liver Function Tests: Recent Labs  Lab 12/02/23 1144 12/03/23 0246  AST 27 35  ALT 20 21  ALKPHOS 90 88  BILITOT 0.6 0.7  PROT 7.2 6.9  ALBUMIN 2.9* 2.8*     CBC: Recent Labs  Lab 11/30/23 0249 12/01/23 0246 12/02/23 1144 12/03/23 0246 12/04/23 0304 12/05/23 0246 12/06/23 0224  WBC 8.9   < > 6.9 6.8 8.0 8.0 9.2  NEUTROABS 4.9  --   --   --   --   --   --   HGB 9.4*   < > 9.9* 9.9* 9.5* 10.0* 9.7*  HCT 28.9*   < > 30.8* 31.7* 30.1* 31.7* 30.4*  MCV 87.8   < > 90.1 91.4 91.2 92.7 92.1  PLT 275   < > 258 241 253 230 264   < > = values in this interval not displayed.   Blood Culture    Component Value Date/Time   SDES CSF 11/19/2023 1134   SPECREQUEST NONE 11/19/2023 1134   CULT  11/19/2023 1134    NO GROWTH 3  DAYS Performed at [facility], [address] [place], [place] [address]    REPTSTATUS 11/23/2023 FINAL 11/19/2023 1134    CBG: Recent Labs  Lab 12/05/23 0659 12/05/23 1142 12/05/23 1718 12/05/23 2226 12/06/23 0609  GLUCAP 199* 134* 108* 147* 148*    Medications:     aspirin EC  81 mg Oral Daily   atorvastatin  40 mg Oral Daily   Chlorhexidine Gluconate Cloth  6 each Topical Q0600   cinacalcet  30 mg Oral Q breakfast   darbepoetin (ARANESP) injection - DIALYSIS  100 mcg Subcutaneous Q Mon-1800   doxercalciferol  3 mcg Intravenous Q M,W,F-HD   feeding supplement  237 mL Oral BID BM   heparin injection (subcutaneous)  5,000 Units Subcutaneous Q8H   insulin aspart  0-5 Units Subcutaneous QHS   insulin aspart  0-9 Units Subcutaneous TID WC   insulin aspart  2 Units Subcutaneous TID WC   insulin glargine-yfgn  25 Units Subcutaneous Daily   midodrine  10 mg Oral  TID WC   multivitamin  1 tablet Oral QHS   pantoprazole  40 mg Oral BID   sevelamer carbonate  4,000 mg Oral TID WC   sodium zirconium cyclosilicate  10 g Oral BID   thiamine  100 mg Oral Daily    Dialysis Orders: [facility] MWF 4h  450/700  105kg   2/2 bath   AVF   Heparin 7000 + 2000 midrun - Hectorol 7 mcg IV three times per week   Assessment/Plan: 1.AMS: Was on cefepime which has been stopped. Per primary team some concern for encephalitis/meningitis, was on empiric antibiotics/antivirals until seen by ID on 11/18/23 and all was stopped.  S/P LP which ruled out bacterail meningitis/encephalitis. He did test + for flu A by resp panel. MRI without acute abnormality.  ID stopped abx and antivirals and signed off. Getting trial of high-dose thiamine. Mental status has sig improved - Much improved 2.  ESRD - usual HD is MWF. Next HD 12/06/22 maybe ready for a chair?  Not so sure at this time, no recent PT/OT notes.  3. NSTEMI- work up per cardiology/primary. No plan for invasive procedure and  not DAPT candidate per cardiology.  Cardiology signed off.  4. Volume - under EDW. Will need lower dry weight on d/c. UF as tolerated.  5. Fever/Hypotension- BC with NGTD. Was started on board spectrum antibiotics/antivirals but now discontinued.  ID signed off. 6. HFrEF/history of severe AS- attempting to optimize volume with dialysis. ECHO  EF 30 to 35%. Seen by Cards - medical management of AS. Fluid restrictions and daily weights. On midodrine 10 mg 3 times daily.  7. Anemia  - Hgb 9.9 now stable and GI Following.  ESA 1/13. Start Fe load today 1/14. As per below 8. Metabolic bone disease -Continue renvela. VDRA on hold d/t high calcium; coming down. 9. Debility - memory is better however he remains debilitated.Needs to demonstrate HD in chair for return to outpt HD. Not there yet.... 10. GIB/Norovirus - C diff neg. GI following. Bloody BMs reported.  1 unit pRBC given 11/26/22.  Completed capsule endoscopy  Arita Miss, MD  [facility] 12/06/2023,10:41 AM  LOS: 28 days

## 2023-12-06 NOTE — Plan of Care (Signed)
  Problem: Education: Goal: Knowledge of General Education information will improve Description: Including pain rating scale, medication(s)/side effects and non-pharmacologic comfort measures Outcome: Progressing   Problem: Health Behavior/Discharge Planning: Goal: Ability to manage health-related needs will improve Outcome: Progressing   Problem: Clinical Measurements: Goal: Ability to maintain clinical measurements within normal limits will improve Outcome: Progressing Goal: Will remain free from infection Outcome: Progressing Goal: Diagnostic test results will improve Outcome: Progressing Goal: Respiratory complications will improve Outcome: Progressing Goal: Cardiovascular complication will be avoided Outcome: Progressing   Problem: Activity: Goal: Risk for activity intolerance will decrease Outcome: Progressing   Problem: Nutrition: Goal: Adequate nutrition will be maintained Outcome: Progressing   Problem: Coping: Goal: Level of anxiety will decrease Outcome: Progressing   Problem: Elimination: Goal: Will not experience complications related to bowel motility Outcome: Progressing Goal: Will not experience complications related to urinary retention Outcome: Progressing   Problem: Pain Management: Goal: General experience of comfort will improve Outcome: Progressing   Problem: Safety: Goal: Ability to remain free from injury will improve Outcome: Progressing   Problem: Skin Integrity: Goal: Risk for impaired skin integrity will decrease Outcome: Progressing   Problem: Education: Goal: Ability to describe self-care measures that may prevent or decrease complications (Diabetes Survival Skills Education) will improve Outcome: Progressing Goal: Individualized Educational Video(s) Outcome: Progressing   Problem: Coping: Goal: Ability to adjust to condition or change in health will improve Outcome: Progressing   Problem: Fluid Volume: Goal: Ability to  maintain a balanced intake and output will improve Outcome: Progressing   Problem: Health Behavior/Discharge Planning: Goal: Ability to identify and utilize available resources and services will improve Outcome: Progressing Goal: Ability to manage health-related needs will improve Outcome: Progressing   Problem: Metabolic: Goal: Ability to maintain appropriate glucose levels will improve Outcome: Progressing   Problem: Nutritional: Goal: Maintenance of adequate nutrition will improve Outcome: Progressing Goal: Progress toward achieving an optimal weight will improve Outcome: Progressing   Problem: Skin Integrity: Goal: Risk for impaired skin integrity will decrease Outcome: Progressing   Problem: Tissue Perfusion: Goal: Adequacy of tissue perfusion will improve Outcome: Progressing   Problem: Safety: Goal: Non-violent Restraint(s) Outcome: Progressing   Problem: Education: Goal: Ability to identify signs and symptoms of gastrointestinal bleeding will improve Outcome: Progressing   Problem: Bowel/Gastric: Goal: Will show no signs and symptoms of gastrointestinal bleeding Outcome: Progressing   Problem: Fluid Volume: Goal: Will show no signs and symptoms of excessive bleeding Outcome: Progressing   Problem: Clinical Measurements: Goal: Complications related to the disease process, condition or treatment will be avoided or minimized Outcome: Progressing

## 2023-12-06 NOTE — Plan of Care (Signed)

## 2023-12-06 NOTE — Progress Notes (Signed)
[facility]                                                                              [name] [name], is a 63 y.o. male, DOB - 04/24/1961, ZOX:[id number] Admit date - 11/08/2023    Outpatient Primary MD for the patient is Pcp, No  LOS - 28  days  Chief Complaint  Patient presents with   Hypotension   Cough   Shortness of Breath       Brief summary   Patient is a 63 y.o. male with PMH significant for ESRD-HD-MWF, DM2, HTN, nonischemic cardiomyopathy, mild systolic CHF, prior right BKA.  Patient presented to ED on 12/22 after experiencing an episode of chest pain, hypotension and near syncope during hemodialysis.  His initial blood pressure was low in 75/59 Troponin was significantly elevated and peaked at 9000, he was diagnosed with NSTEMI, started on heparin drip.  Patient was admitted for further workup.   Hospital course was also complicated altered mental status, influenza, GI bleeding, norovirus diarrhea Patient is currently weak and unable to be in a chair for dialysis. Palliative care consulted.   Assessment & Plan   NSTEMI -Presented with chest pain, hypertension, syncope, significantly elevated troponin  -2D echo 11/09/2023 showed EF 30 -35% with regional WMA, moderate to severe aortic stenosis - Initially started on medical management with IV heparin drip, aspirin and Lipitor -Initial consideration of cardiac cath, was placed on trial of DAPT.  However after single dose of Plavix with aspirin, patient had profuse rectal bleeding, hence DAPT was held.  Per cardiology, no plan for inpatient ischemic evaluation.  This can be revisited as outpatient when GI issues stabilized and cleared to utilize DAPT going forward. -Cleared by GI to place on antiplatelet agent,  -Per cardiology, started on aspirin 81 mg daily, outpatient cardiac evaluation planned.  Continue statin  Acute GI bleeding  Norovirus diarrhea -Patient had several days of GI bleeding,  worsened with DAPT - Bleeding suspected to be combination of norovirus diarrhea, ischemic colitis, no prior colonoscopy.  FOBT positive on 1/3, GI pathogen panel positive for norovirus -CTA GI bleed 1/13 showed no active GI bleeding in the imaging  -Patient was offered EGD, colonoscopy, he declined. -Per GI, Dr. Leonides Schanz, capsule endoscopy completed, gastritis and duodenitis with small bowel angioectasia, no active bleeding  -Continue PPI, okay to start AC/antiplatelet agent from GI perspective -Today complaining of some periumbilical abdominal pain, ordered KUB.   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  Currently on midodrine  -Metoprolol held due to hypotension.   -Cardiology will follow outpatient.  Continue HD for volume management.    ESRD-HD-MWF -Nephrology following -Continue HD per schedule Patient needs to be able to tolerate dialysis in chair to consider outpatient dialysis.   Influenza A positive -RVP positive for flu A + on 12/30.  Completed the course of Tamiflu.   Acute metabolic encephalopathy, likely due to prolonged  delirium -Patient hospital stay was complicated by altered mental status.  There was initial concern for encephalitis vs meningitis. Neurology was consulted.  Patient was placed on broad-spectrum antibiotics -EEG 12/27 and 1/1 did not show any evidence of seizure activity. -MRI brain 1/3 negative for acute abnormality  -LP on 1/3,  CSF labs significant for elevated glucose and protein, not consistent with meningitis. Meningitis/encephalitis panel negative for pathogen. -ID and neurology signed off. -Mental status improving   Type 2 diabetes mellitus uncontrolled -Hemoglobin A1c > 15.5 on 10/24/2023   CBG (last 3)  Recent Labs     12/05/23 1718 12/05/23 2226 12/06/23 0609  GLUCAP 108* 147* 148*   -Continue sensitive SSI, Semglee 25 units daily, continue  NovoLog 2 units 3 times daily AC   H/o CVA Continue Lipitor   Estimated body mass index is 29.96 kg/m as calculated from the following:   Height as of this encounter: 6\' 1"  (1.854 m).   Weight as of this encounter: 103 kg.  Code Status: Full code DVT Prophylaxis:  heparin injection 5,000 Units Start: 12/04/23 1445 Place and maintain sequential compression device Start: 11/20/23 0706 SCDs Start: 11/09/23 1558   Level of Care: Level of care: Progressive Family Communication:  Disposition Plan:      Remains inpatient appropriate:   Need to be able to tolerate HD in chair for outpatient HD center   Procedures:    Consultants:   Gastroenterology Cardiology Palliative medicine  Antimicrobials:   Anti-infectives (From admission, onward)    Start     Dose/Rate Route Frequency Ordered Stop   11/18/23 1800  oseltamivir (TAMIFLU) capsule 30 mg        30 mg Oral Every M-W-F (Hemodialysis) 11/18/23 1116 11/23/23 1159   11/17/23 1800  oseltamivir (TAMIFLU) capsule 30 mg  Status:  Discontinued        30 mg Oral Every T-Th-Sa (1800) 11/17/23 0800 11/18/23 1116   11/17/23 1115  vancomycin (VANCOCIN) IVPB 1000 mg/200 mL premix  Status:  Discontinued        1,000 mg 200 mL/hr over 60 Minutes Intravenous Once in dialysis 11/17/23 1021 11/18/23 1331   11/17/23 1000  oseltamivir (TAMIFLU) capsule 30 mg        30 mg Oral  Once 11/17/23 0759 11/17/23 0952   11/16/23 1200  vancomycin (VANCOCIN) IVPB 1000 mg/200 mL premix  Status:  Discontinued        1,000 mg 200 mL/hr over 60 Minutes Intravenous Every M-W-F (Hemodialysis) 11/15/23 1658 11/18/23 1306   11/12/23 2300  vancomycin (VANCOCIN) IVPB 1000 mg/200 mL premix        1,000 mg 200 mL/hr over 60 Minutes Intravenous  Once 11/12/23 2226 11/13/23 0131   11/12/23 2000  cefTRIAXone (ROCEPHIN) 2 g in sodium  chloride 0.9 % 100 mL IVPB  Status:  Discontinued        2 g 200 mL/hr over 30 Minutes Intravenous Every 12 hours 11/12/23 1627 11/18/23 1306   11/12/23 2000  ampicillin (OMNIPEN) 2 g in sodium chloride 0.9 % 100 mL IVPB  Status:  Discontinued        2 g 300 mL/hr over 20 Minutes Intravenous Every 12 hours 11/12/23 1627 11/18/23 1306   11/12/23 1730  acyclovir (ZOVIRAX) 450 mg in dextrose 5 % 100 mL IVPB  Status:  Discontinued        5 mg/kg  89.6 kg (Adjusted) 109 mL/hr over 60 Minutes Intravenous Every 24 hours 11/12/23 1627 11/18/23 1306   11/11/23  1915  piperacillin-tazobactam (ZOSYN) IVPB 2.25 g  Status:  Discontinued        2.25 g 100 mL/hr over 30 Minutes Intravenous Every 8 hours 11/11/23 1825 11/12/23 1615   11/11/23 1245  metroNIDAZOLE (FLAGYL) IVPB 500 mg  Status:  Discontinued        500 mg 100 mL/hr over 60 Minutes Intravenous Every 12 hours 11/11/23 1146 11/11/23 1825   11/11/23 1200  vancomycin (VANCOCIN) IVPB 1000 mg/200 mL premix  Status:  Discontinued        1,000 mg 200 mL/hr over 60 Minutes Intravenous Every M-W-F (Hemodialysis) 11/10/23 1458 11/15/23 1658   11/10/23 1545  vancomycin (VANCOCIN) IVPB 1000 mg/200 mL premix       Placed in "Followed by" Linked Group   1,000 mg 200 mL/hr over 60 Minutes Intravenous  Once 11/10/23 1458 11/10/23 1917   11/10/23 1545  vancomycin (VANCOCIN) IVPB 1000 mg/200 mL premix       Placed in "Followed by" Linked Group   1,000 mg 200 mL/hr over 60 Minutes Intravenous  Once 11/10/23 1458 11/10/23 1815   11/10/23 1545  ceFEPIme (MAXIPIME) 1 g in sodium chloride 0.9 % 100 mL IVPB  Status:  Discontinued        1 g 200 mL/hr over 30 Minutes Intravenous Every M-W-F 11/10/23 1458 11/10/23 1520   11/10/23 1545  ceFEPIme (MAXIPIME) 2 g in sodium chloride 0.9 % 100 mL IVPB  Status:  Discontinued        2 g 200 mL/hr over 30 Minutes Intravenous Every M-W-F 11/10/23 1520 11/11/23 1810          Medications  aspirin EC  81 mg Oral Daily    atorvastatin  40 mg Oral Daily   Chlorhexidine Gluconate Cloth  6 each Topical Q0600   cinacalcet  30 mg Oral Q breakfast   darbepoetin (ARANESP) injection - DIALYSIS  100 mcg Subcutaneous Q Mon-1800   doxercalciferol  3 mcg Intravenous Q M,W,F-HD   feeding supplement  237 mL Oral BID BM   heparin injection (subcutaneous)  5,000 Units Subcutaneous Q8H   insulin aspart  0-5 Units Subcutaneous QHS   insulin aspart  0-9 Units Subcutaneous TID WC   insulin aspart  2 Units Subcutaneous TID WC   insulin glargine-yfgn  25 Units Subcutaneous Daily   midodrine  10 mg Oral TID WC   multivitamin  1 tablet Oral QHS   pantoprazole  40 mg Oral BID   sevelamer carbonate  4,000 mg Oral TID WC   sodium zirconium cyclosilicate  10 g Oral BID   thiamine  100 mg Oral Daily      Subjective:   Tali Zipprich was seen and examined today.  Complaining of periumbilical abdominal pain, no nausea vomiting, fevers or chills.  Poor appetite.  No fevers  Objective:   Vitals:   12/06/23 0019 12/06/23 0450 12/06/23 0526 12/06/23 0816  BP: 121/63 123/70  118/64  Pulse: 86 82  80  Resp: 17 18  18   Temp: 98.8 F (37.1 C) 98.4 F (36.9 C)  98.6 F (37 C)  TempSrc: Oral Oral  Oral  SpO2: 97% 97%  97%  Weight:   103 kg   Height:        Intake/Output Summary (Last 24 hours) at 12/06/2023 1137 Last data filed at 12/05/2023 2236 Gross per 24 hour  Intake 150 ml  Output --  Net 150 ml     Wt Readings from Last 3 Encounters:  12/06/23  103 kg  10/26/23 110.5 kg  10/22/23 108.9 kg    Physical Exam General: Alert and oriented x 3, NAD Cardiovascular: S1 S2 clear, RRR.  Respiratory: CTAB, no wheezing, rales or rhonchi Gastrointestinal: Soft, mild diffuse TTP Ext: right BKA, LLE no edema Neuro: no new deficits Psych: flat affect   Data Reviewed:  I have personally reviewed following labs    CBC Lab Results  Component Value Date   WBC 9.2 12/06/2023   RBC 3.30 (L) 12/06/2023   HGB 9.7 (L)  12/06/2023   HCT 30.4 (L) 12/06/2023   MCV 92.1 12/06/2023   MCH 29.4 12/06/2023   PLT 264 12/06/2023   MCHC 31.9 12/06/2023   RDW 17.2 (H) 12/06/2023   LYMPHSABS 1.8 11/30/2023   MONOABS 1.7 (H) 11/30/2023   EOSABS 0.4 11/30/2023   BASOSABS 0.1 11/30/2023     Last metabolic panel Lab Results  Component Value Date   NA 132 (L) 12/06/2023   K 5.4 (H) 12/06/2023   CL 97 (L) 12/06/2023   CO2 24 12/06/2023   BUN 32 (H) 12/06/2023   CREATININE 10.01 (H) 12/06/2023   GLUCOSE 177 (H) 12/06/2023   GFRNONAA 5 (L) 12/06/2023   GFRAA 3 (L) 08/14/2019   CALCIUM 10.1 12/06/2023   PHOS 8.0 (H) 11/23/2023   PROT 6.9 12/03/2023   ALBUMIN 2.8 (L) 12/03/2023   LABGLOB 5.1 (H) 12/17/2022   AGRATIO 0.7 11/20/2022   BILITOT 0.7 12/03/2023   ALKPHOS 88 12/03/2023   AST 35 12/03/2023   ALT 21 12/03/2023   ANIONGAP 11 12/06/2023    CBG (last 3)  Recent Labs    12/05/23 1718 12/05/23 2226 12/06/23 0609  GLUCAP 108* 147* 148*      Coagulation Profile: No results for input(s): "INR", "PROTIME" in the last 168 hours.   Radiology Studies: I have personally reviewed the imaging studies  No results found.      Thad Ranger M.D. [facility] 12/06/2023, 11:37 AM  Available via Epic secure chat 7am-7pm After 7 pm, please refer to night coverage provider listed on amion.

## 2023-12-07 DIAGNOSIS — N186 End stage renal disease: Secondary | ICD-10-CM | POA: Diagnosis not present

## 2023-12-07 DIAGNOSIS — I953 Hypotension of hemodialysis: Secondary | ICD-10-CM | POA: Diagnosis not present

## 2023-12-07 DIAGNOSIS — I214 Non-ST elevation (NSTEMI) myocardial infarction: Secondary | ICD-10-CM | POA: Diagnosis not present

## 2023-12-07 DIAGNOSIS — I5023 Acute on chronic systolic (congestive) heart failure: Secondary | ICD-10-CM | POA: Diagnosis not present

## 2023-12-07 LAB — GLUCOSE, CAPILLARY
Glucose-Capillary: 94 mg/dL (ref 70–99)
Glucose-Capillary: 92 mg/dL (ref 70–99)
Glucose-Capillary: 180 mg/dL — ABNORMAL HIGH (ref 70–99)
Glucose-Capillary: 171 mg/dL — ABNORMAL HIGH (ref 70–99)

## 2023-12-07 LAB — CBC
HCT: 29.8 % — ABNORMAL LOW (ref 39.0–52.0)
Hemoglobin: 9.4 g/dL — ABNORMAL LOW (ref 13.0–17.0)
MCH: 29.5 pg (ref 26.0–34.0)
MCHC: 31.5 g/dL (ref 30.0–36.0)
MCV: 93.4 fL (ref 80.0–100.0)
Platelets: 245 10*3/uL (ref 150–400)
RBC: 3.19 MIL/uL — ABNORMAL LOW (ref 4.22–5.81)
RDW: 17.7 % — ABNORMAL HIGH (ref 11.5–15.5)
WBC: 7.7 10*3/uL (ref 4.0–10.5)
nRBC: 0 % (ref 0.0–0.2)

## 2023-12-07 LAB — POTASSIUM: Potassium: 5.7 mmol/L — ABNORMAL HIGH (ref 3.5–5.1)

## 2023-12-07 LAB — BASIC METABOLIC PANEL
Anion gap: 13 (ref 5–15)
BUN: 39 mg/dL — ABNORMAL HIGH (ref 8–23)
CO2: 22 mmol/L (ref 22–32)
Calcium: 9.8 mg/dL (ref 8.9–10.3)
Chloride: 99 mmol/L (ref 98–111)
Creatinine, Ser: 11.71 mg/dL — ABNORMAL HIGH (ref 0.61–1.24)
GFR, Estimated: 4 mL/min — ABNORMAL LOW (ref >60)
Glucose, Bld: 138 mg/dL — ABNORMAL HIGH (ref 70–99)
Potassium: 6.4 mmol/L (ref 3.5–5.1)
Sodium: 134 mmol/L — ABNORMAL LOW (ref 135–145)

## 2023-12-07 MED ORDER — DEXTROSE 50 % IV SOLN
25.0000 mL | Freq: Once | INTRAVENOUS | Status: AC
  Administered 2023-12-07: 25 mL via INTRAVENOUS
  Filled 2023-12-07: qty 50

## 2023-12-07 MED ORDER — INSULIN ASPART 100 UNIT/ML IJ SOLN
5.0000 [IU] | Freq: Once | INTRAMUSCULAR | Status: AC
  Administered 2023-12-07: 5 [IU] via INTRAVENOUS

## 2023-12-07 MED ORDER — SODIUM ZIRCONIUM CYCLOSILICATE 10 G PO PACK
10.0000 g | PACK | Freq: Once | ORAL | Status: AC
  Administered 2023-12-07: 10 g via ORAL
  Filled 2023-12-07: qty 1

## 2023-12-07 MED ORDER — SODIUM BICARBONATE 8.4 % IV SOLN
25.0000 meq | Freq: Once | INTRAVENOUS | Status: AC
  Administered 2023-12-07: 25 meq via INTRAVENOUS
  Filled 2023-12-07: qty 50

## 2023-12-07 NOTE — Progress Notes (Addendum)
   12/07/23 1310  Vitals  Temp 98.4 F (36.9 C)  Pulse Rate 81  Resp (!) 24  BP 117/63  SpO2 94 %  O2 Device Room Air  Weight 100.7 kg  Type of Weight Post-Dialysis  Oxygen Therapy  Patient Activity (if Appropriate) In bed  Pulse Oximetry Type Continuous  Oximetry Probe Site Changed No  Post Treatment  Dialyzer Clearance Lightly streaked  Hemodialysis Intake (mL) 0 mL  Liters Processed 75.5  Fluid Removed (mL) 1800 mL  Tolerated HD Treatment Yes  AVG/AVF Arterial Site Held (minutes) 5 minutes  AVG/AVF Venous Site Held (minutes) 5 minutes   Received patient in bed to unit.  Alert and oriented.  Informed consent signed and in chart.   TX duration: 3.5 hours  Patient tolerated well.  Transported back to the room  Alert, without acute distress.  Hand-off given to patient's nurse.   Access used: Right AVF Access issues: None  Total UF removed: 1.8L Medication(s) given: Hectorol

## 2023-12-07 NOTE — Progress Notes (Addendum)
[facility]                                                                              [name] [name], is a 63 y.o. male, DOB - [birth year], NWG:[id number] Admit date - 11/08/2023    Outpatient Primary MD for the patient is Pcp, No  LOS - 29  days  Chief Complaint  Patient presents with   Hypotension   Cough   Shortness of Breath       Brief summary   Patient is a 63 y.o. male with PMH significant for ESRD-HD-MWF, DM2, HTN, nonischemic cardiomyopathy, mild systolic CHF, prior right BKA.  Patient presented to ED on 12/22 after experiencing an episode of chest pain, hypotension and near syncope during hemodialysis.  His initial blood pressure was low in 75/59 Troponin was significantly elevated and peaked at 9000, he was diagnosed with NSTEMI, started on heparin drip.  Patient was admitted for further workup.   Hospital course was also complicated altered mental status, influenza, GI bleeding, norovirus diarrhea Patient is currently weak and unable to be in a chair for dialysis. Palliative care consulted.   Assessment & Plan   NSTEMI -Presented with chest pain, hypertension, syncope, significantly elevated troponin  -2D echo 11/09/2023 showed EF 30 -35% with regional WMA, moderate to severe aortic stenosis - Initially started on medical management with IV heparin drip, aspirin and Lipitor -Initial consideration of cardiac cath, was placed on trial of DAPT.  However after single dose of Plavix with aspirin, patient had profuse rectal bleeding, hence DAPT was held.  Per cardiology, no plan for inpatient ischemic evaluation.  This can be revisited as outpatient when GI issues stabilized and cleared to utilize DAPT going forward. -Cleared by GI to place on antiplatelet agent,  -Per cardiology, started on aspirin 81 mg daily, outpatient cardiac evaluation planned.  Continue statin  Acute GI bleeding  Norovirus diarrhea -Patient had several days of GI bleeding,  worsened with DAPT - Bleeding suspected to be combination of norovirus diarrhea, ischemic colitis, no prior colonoscopy.  FOBT positive on 1/3, GI pathogen panel positive for norovirus -CTA GI bleed 1/13 showed no active GI bleeding in the imaging  -Patient was offered EGD, colonoscopy, he declined. -Per GI, Dr. 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  Currently on midodrine  -Metoprolol held due to hypotension.   -Cardiology will follow outpatient.  Continue HD for volume management.    ESRD-HD-MWF -Nephrology following -Continue HD per schedule Patient needs to be able to tolerate dialysis in chair to consider outpatient dialysis.   Influenza A positive -RVP positive for flu A + on 12/30.  Completed the course of Tamiflu.   Acute metabolic encephalopathy, likely due to prolonged delirium -  Patient met sepsis criteria on admission, POA due to tachycardia, fever, tachypnea, altered mental status -Patient hospital stay was complicated by altered mental status.  There was initial concern for encephalitis vs meningitis. Neurology was consulted.  Patient was placed on broad-spectrum antibiotics -EEG 12/27 and 1/1 did not show any evidence of seizure activity. -MRI brain 1/3 negative for acute abnormality  -LP on 1/3,  CSF labs significant for elevated glucose and protein, not consistent with meningitis. Meningitis/encephalitis panel negative for pathogen. -ID and neurology signed off. -Mental status improving.  Sepsis physiology has  resolved.   Type 2 diabetes mellitus uncontrolled -Hemoglobin A1c > 15.5 on 10/24/2023   CBG (last 3)  Recent Labs    12/06/23 1647 12/06/23 2130 12/07/23 0638  GLUCAP 199* 150* 94   -Continue sensitive SSI, Semglee 25 units daily, continue  NovoLog 2 units 3 times daily AC   H/o CVA Continue Lipitor   Estimated body mass index is 29.29 kg/m as calculated from the following:   Height as of this encounter: 6\' 1"  (1.854 m).   Weight as of this encounter: 100.7 kg.  Code Status: Full code DVT Prophylaxis:  heparin injection 5,000 Units Start: 12/04/23 1445 Place and maintain sequential compression device Start: 11/20/23 0706 SCDs Start: 11/09/23 1558   Level of Care: Level of care: Progressive Family Communication:  Disposition Plan:      Remains inpatient appropriate:   Need to be able to tolerate HD in chair for outpatient HD center   Procedures:    Consultants:   Gastroenterology Cardiology Palliative medicine  Antimicrobials:   Anti-infectives (From admission, onward)    Start     Dose/Rate Route Frequency Ordered Stop   11/18/23 1800  oseltamivir (TAMIFLU) capsule 30 mg        30 mg Oral Every M-W-F (Hemodialysis) 11/18/23 1116 11/23/23 1159   11/17/23 1800  oseltamivir (TAMIFLU) capsule 30 mg  Status:  Discontinued        30 mg Oral Every T-Th-Sa (1800) 11/17/23 0800 11/18/23 1116   11/17/23 1115  vancomycin (VANCOCIN) IVPB 1000 mg/200 mL premix  Status:  Discontinued        1,000 mg 200 mL/hr over 60 Minutes Intravenous Once in dialysis 11/17/23 1021 11/18/23 1331   11/17/23 1000  oseltamivir (TAMIFLU) capsule 30 mg        30 mg Oral  Once 11/17/23 0759 11/17/23 0952   11/16/23 1200  vancomycin (VANCOCIN) IVPB 1000 mg/200 mL premix  Status:  Discontinued        1,000 mg 200 mL/hr over 60 Minutes Intravenous Every M-W-F (Hemodialysis) 11/15/23 1658 11/18/23 1306   11/12/23 2300  vancomycin (VANCOCIN) IVPB 1000 mg/200 mL premix        1,000 mg 200 mL/hr  over 60 Minutes Intravenous  Once 11/12/23 2226 11/13/23 0131   11/12/23 2000  cefTRIAXone (ROCEPHIN) 2 g in sodium chloride 0.9 % 100 mL IVPB  Status:  Discontinued        2 g 200 mL/hr over 30 Minutes Intravenous Every 12 hours 11/12/23 1627 11/18/23 1306   11/12/23 2000  ampicillin (OMNIPEN) 2 g in sodium chloride 0.9 % 100 mL IVPB  Status:  Discontinued        2 g 300 mL/hr over 20 Minutes Intravenous Every 12 hours 11/12/23 1627 11/18/23 1306   11/12/23 1730  acyclovir (ZOVIRAX) 450 mg in dextrose 5 % 100 mL IVPB  Status:  Discontinued        5 mg/kg  89.6 kg (Adjusted) 109 mL/hr over 60 Minutes Intravenous Every 24 hours 11/12/23 1627 11/18/23 1306   11/11/23 1915  piperacillin-tazobactam (ZOSYN) IVPB 2.25 g  Status:  Discontinued        2.25 g 100 mL/hr over 30 Minutes Intravenous Every 8 hours 11/11/23 1825 11/12/23 1615   11/11/23 1245  metroNIDAZOLE (FLAGYL) IVPB 500 mg  Status:  Discontinued        500 mg 100 mL/hr over 60 Minutes Intravenous Every 12 hours 11/11/23 1146 11/11/23 1825   11/11/23 1200  vancomycin (VANCOCIN) IVPB 1000 mg/200 mL premix  Status:  Discontinued        1,000 mg 200 mL/hr over 60 Minutes Intravenous Every M-W-F (Hemodialysis) 11/10/23 1458 11/15/23 1658   11/10/23 1545  vancomycin (VANCOCIN) IVPB 1000 mg/200 mL premix       Placed in "Followed by" Linked Group   1,000 mg 200 mL/hr over 60 Minutes Intravenous  Once 11/10/23 1458 11/10/23 1917   11/10/23 1545  vancomycin (VANCOCIN) IVPB 1000 mg/200 mL premix       Placed in "Followed by" Linked Group   1,000 mg 200 mL/hr over 60 Minutes Intravenous  Once 11/10/23 1458 11/10/23 1815   11/10/23 1545  ceFEPIme (MAXIPIME) 1 g in sodium chloride 0.9 % 100 mL IVPB  Status:  Discontinued        1 g 200 mL/hr over 30 Minutes Intravenous Every M-W-F 11/10/23 1458 11/10/23 1520   11/10/23 1545  ceFEPIme (MAXIPIME) 2 g in sodium chloride 0.9 % 100 mL IVPB  Status:  Discontinued        2 g 200 mL/hr over 30  Minutes Intravenous Every M-W-F 11/10/23 1520 11/11/23 1810          Medications  aspirin EC  81 mg Oral Daily   atorvastatin  40 mg Oral Daily   Chlorhexidine Gluconate Cloth  6 each Topical Q0600   cinacalcet  30 mg Oral Q breakfast   darbepoetin (ARANESP) injection - DIALYSIS  100 mcg Subcutaneous Q Mon-1800   doxercalciferol  3 mcg Intravenous Q M,W,F-HD   feeding supplement  237 mL Oral BID BM   heparin injection (subcutaneous)  5,000 Units Subcutaneous Q8H   insulin aspart  0-5 Units Subcutaneous QHS   insulin aspart  0-9 Units Subcutaneous TID WC   insulin aspart  2 Units Subcutaneous TID WC   insulin glargine-yfgn  25 Units Subcutaneous Daily   midodrine  10 mg Oral TID WC   multivitamin  1 tablet Oral QHS   pantoprazole  40 mg Oral BID   sevelamer carbonate  4,000 mg Oral TID WC   thiamine  100 mg Oral Daily      Subjective:   Zakarya Frischman was seen and examined today.  Seen in HD, no acute issues.  No abdominal pain, nausea or vomiting.    Objective:   Vitals:   12/07/23 1300 12/07/23 1303 12/07/23 1305 12/07/23 1310  BP: 132/65 126/69 126/69 117/63  Pulse: 79 79 78 81  Resp: (!) 25 19 17  (!) 24  Temp:  98.4 F (36.9 C)  98.4 F (36.9 C)  TempSrc:  Oral    SpO2: 92% 98% 98% 94%  Weight:    100.7 kg  Height:        Intake/Output Summary (Last 24 hours) at 12/07/2023 1351 Last data filed at 12/07/2023 1310 Gross per 24 hour  Intake 300 ml  Output 1800 ml  Net -1500 ml     Wt  Readings from Last 3 Encounters:  12/07/23 100.7 kg  10/26/23 110.5 kg  10/22/23 108.9 kg   Physical Exam General: Alert and oriented x 3, NAD Cardiovascular: S1 S2 clear, RRR.  Respiratory: CTAB, no wheezing Gastrointestinal: Soft, nontender, nondistended, NBS Ext: right BKA  Neuro: no new deficits Psych: Normal affect    Data Reviewed:  I have personally reviewed following labs    CBC Lab Results  Component Value Date   WBC 7.7 12/07/2023   RBC 3.19 (L)  12/07/2023   HGB 9.4 (L) 12/07/2023   HCT 29.8 (L) 12/07/2023   MCV 93.4 12/07/2023   MCH 29.5 12/07/2023   PLT 245 12/07/2023   MCHC 31.5 12/07/2023   RDW 17.7 (H) 12/07/2023   LYMPHSABS 1.8 11/30/2023   MONOABS 1.7 (H) 11/30/2023   EOSABS 0.4 11/30/2023   BASOSABS 0.1 11/30/2023     Last metabolic panel Lab Results  Component Value Date   NA 134 (L) 12/07/2023   K 5.7 (H) 12/07/2023   CL 99 12/07/2023   CO2 22 12/07/2023   BUN 39 (H) 12/07/2023   CREATININE 11.71 (H) 12/07/2023   GLUCOSE 138 (H) 12/07/2023   GFRNONAA 4 (L) 12/07/2023   GFRAA 3 (L) 08/14/2019   CALCIUM 9.8 12/07/2023   PHOS 8.0 (H) 11/23/2023   PROT 6.9 12/03/2023   ALBUMIN 2.8 (L) 12/03/2023   LABGLOB 5.1 (H) 12/17/2022   AGRATIO 0.7 11/20/2022   BILITOT 0.7 12/03/2023   ALKPHOS 88 12/03/2023   AST 35 12/03/2023   ALT 21 12/03/2023   ANIONGAP 13 12/07/2023    CBG (last 3)  Recent Labs    12/06/23 1647 12/06/23 2130 12/07/23 0638  GLUCAP 199* 150* 94      Coagulation Profile: No results for input(s): "INR", "PROTIME" in the last 168 hours.   Radiology Studies: I have personally reviewed the imaging studies  DG Abd Portable 1V Result Date: 12/06/2023 CLINICAL DATA:  Abdominal pain EXAM: PORTABLE ABDOMEN - 1 VIEW COMPARISON:  02/11/2022 FINDINGS: Nonobstructive bowel gas pattern. No organomegaly or visible free air. Diffuse vascular calcifications noted. IMPRESSION: No acute findings. Electronically Signed   By: Charlett Nose M.D.   On: 12/06/2023 17:10        Jessey Huyett M.D. [facility] 12/07/2023, 1:51 PM  Available via Epic secure chat 7am-7pm After 7 pm, please refer to night coverage provider listed on amion.

## 2023-12-07 NOTE — Progress Notes (Signed)
[facility] Progress Note   Subjective:    Seen in [facility] - on HD in bed  Hyperkalemia this am -on 2K bath  No new complaints. Denies cp, sob   Objective Vitals:   12/07/23 0930 12/07/23 1000 12/07/23 1030 12/07/23 1100  BP: 129/74 (!) 127/59 134/73 123/68  Pulse: 88 84 82 79  Resp: (!) 24 18 18  (!) 21  Temp:      TempSrc:      SpO2: 94% 97% 94% 97%  Weight:      Height:       Physical Exam General: chronically ill appearing male in NAD; lying in bed Heart: RRR,+3/6 systolic murmur Lungs: CTAB, nml WOB on RA Abdomen: soft, NTND Extremities: no LE edema, R BKA Dialysis Access: LU AVF +b/t   Filed Weights   12/05/23 0442 12/06/23 0526 12/07/23 0846  Weight: 101.7 kg 103 kg 103.3 kg    Intake/Output Summary (Last 24 hours) at 12/07/2023 1129 Last data filed at 12/06/2023 2030 Gross per 24 hour  Intake 300 ml  Output --  Net 300 ml    Additional Objective Labs: Basic Metabolic Panel: Recent Labs  Lab 12/05/23 0246 12/06/23 0224 12/07/23 0251 12/07/23 0706  NA 132* 132* 134*  --   K 5.3* 5.4* 6.4* 5.7*  CL 98 97* 99  --   CO2 26 24 22   --   GLUCOSE 245* 177* 138*  --   BUN 26* 32* 39*  --   CREATININE 8.06* 10.01* 11.71*  --   CALCIUM 9.8 10.1 9.8  --    Liver Function Tests: Recent Labs  Lab 12/02/23 1144 12/03/23 0246  AST 27 35  ALT 20 21  ALKPHOS 90 88  BILITOT 0.6 0.7  PROT 7.2 6.9  ALBUMIN 2.9* 2.8*     CBC: Recent Labs  Lab 12/03/23 0246 12/04/23 0304 12/05/23 0246 12/06/23 0224 12/07/23 0925  WBC 6.8 8.0 8.0 9.2 7.7  HGB 9.9* 9.5* 10.0* 9.7* 9.4*  HCT 31.7* 30.1* 31.7* 30.4* 29.8*  MCV 91.4 91.2 92.7 92.1 93.4  PLT 241 253 230 264 245   Blood Culture    Component Value Date/Time   SDES CSF 11/19/2023 1134   SPECREQUEST NONE 11/19/2023 1134   CULT  11/19/2023 1134    NO GROWTH 3 DAYS Performed at [facility], [address] [place], [place] [address]    REPTSTATUS 11/23/2023 FINAL 11/19/2023 1134     CBG: Recent Labs  Lab 12/06/23 0609 12/06/23 1159 12/06/23 1647 12/06/23 2130 12/07/23 0638  GLUCAP 148* 109* 199* 150* 94    Medications:     aspirin EC  81 mg Oral Daily   atorvastatin  40 mg Oral Daily   Chlorhexidine Gluconate Cloth  6 each Topical Q0600   cinacalcet  30 mg Oral Q breakfast   darbepoetin (ARANESP) injection - DIALYSIS  100 mcg Subcutaneous Q Mon-1800   doxercalciferol  3 mcg Intravenous Q M,W,F-HD   feeding supplement  237 mL Oral BID BM   heparin injection (subcutaneous)  5,000 Units Subcutaneous Q8H   insulin aspart  0-5 Units Subcutaneous QHS   insulin aspart  0-9 Units Subcutaneous TID WC   insulin aspart  2 Units Subcutaneous TID WC   insulin glargine-yfgn  25 Units Subcutaneous Daily   midodrine  10 mg Oral TID WC   multivitamin  1 tablet Oral QHS   pantoprazole  40 mg Oral BID   sevelamer carbonate  4,000 mg Oral TID  WC   thiamine  100 mg Oral Daily    Dialysis Orders: [facility] MWF 4h  450/700  105kg   2/2 bath   AVF   Heparin 7000 + 2000 midrun - Hectorol 7 mcg IV three times per week   Assessment/Plan: 1.AMS: Was on cefepime which has been stopped. Per primary team some concern for encephalitis/meningitis, was on empiric antibiotics/antivirals until seen by ID on 11/18/23 and all was stopped.  S/P LP which ruled out bacterail meningitis/encephalitis. He did test + for flu A by resp panel. MRI without acute abnormality.  ID stopped abx and antivirals and signed off. Getting trial of high-dose thiamine. Mental status has sig improved - Much improved 2.  ESRD - usual HD is MWF. Next HD 12/06/22.  Need to see if he can tolerate HD in chair.  3. Hyperkalemia - correct with HD today. Follow labs.  4. NSTEMI- work up per cardiology/primary. No plan for invasive procedure and not DAPT candidate per cardiology.  Cardiology signed off.  5. Volume - under EDW. Will need lower dry weight on d/c. UF as tolerated.  6.  Fever/Hypotension- BC with NGTD. Was started on board spectrum antibiotics/antivirals but now discontinued.  ID signed off. 7. HFrEF/history of severe AS- attempting to optimize volume with dialysis. ECHO  EF 30 to 35%. Seen by Cards - medical management of AS. Fluid restrictions and daily weights. On midodrine 10 mg 3 times daily.  8. Anemia  - Hgb 9.9 now stable and GI Following.  ESA 1/13. Start Fe load today 1/14. As per below 9. Metabolic bone disease -Continue renvela. VDRA on hold d/t high calcium; coming down. 10. Debility - memory is better however he remains debilitated.Needs to demonstrate HD in chair for return to outpt HD. Not there yet.... 11. GIB/Norovirus - C diff neg. GI following. Bloody BMs reported.  1 unit pRBC given 11/26/22.  Completed capsule endoscopy  Tomasa Blase, PA-C  [facility] 12/07/2023,11:29 AM  LOS: 29 days

## 2023-12-07 NOTE — Progress Notes (Signed)
PT Cancellation Note  Patient Details Name: [name] [name] Sr. MRN: [id number] DOB: Jan 22, 1961   Cancelled Treatment:    Reason Eval/Treat Not Completed: Patient at procedure or test/unavailable (Pt in HD. Will reattempt in pm as able.)   Bevelyn Buckles 12/07/2023, 9:20 AM Cordaryl Decelles M,PT Acute Rehab Services [phone]

## 2023-12-07 NOTE — TOC Progression Note (Addendum)
Transition of Care (TOC) - Progression Note    Patient Details  Name: [name] OLAFSON Sr. MRN: [id number] Date of Birth: [birth year]  Transition of Care [facility]) CM/SW Contact  Michaela Corner, [place] Phone Number: 12/07/2023, 3:03 PM  Clinical Narrative:   Per chart review, pt will need to tolerate HD in chair before dc to SNF for outpt dialysis.     Expected Discharge Plan: Skilled Nursing Facility Barriers to Discharge: Continued Medical Work up  Expected Discharge Plan and Services       Living arrangements for the past 2 months: Single Family Home                           HH Arranged:  (Active with [facility])           Social Determinants of Health (SDOH) Interventions SDOH Screenings   Food Insecurity: Patient Unable To Answer (11/10/2023)  Housing: Patient Unable To Answer (11/10/2023)  Transportation Needs: Unknown (11/10/2023)  Utilities: Patient Unable To Answer (11/10/2023)  Alcohol Screen: Low Risk  (02/04/2022)  Depression (PHQ2-9): Low Risk  (08/06/2022)  Tobacco Use: Low Risk  (12/02/2023)    Readmission Risk Interventions    11/05/2022    3:08 PM  Readmission Risk Prevention Plan  Transportation Screening Complete  Medication Review (RN Care Manager) Complete  PCP or Specialist appointment within 3-5 days of discharge Complete  HRI or Home Care Consult Complete  SW Recovery Care/Counseling Consult Complete  Palliative Care Screening Complete  Skilled Nursing Facility Complete

## 2023-12-08 DIAGNOSIS — N186 End stage renal disease: Secondary | ICD-10-CM | POA: Diagnosis not present

## 2023-12-08 DIAGNOSIS — I214 Non-ST elevation (NSTEMI) myocardial infarction: Secondary | ICD-10-CM | POA: Diagnosis not present

## 2023-12-08 DIAGNOSIS — I9589 Other hypotension: Secondary | ICD-10-CM | POA: Diagnosis not present

## 2023-12-08 DIAGNOSIS — I5023 Acute on chronic systolic (congestive) heart failure: Secondary | ICD-10-CM | POA: Diagnosis not present

## 2023-12-08 LAB — CBC
HCT: 31.2 % — ABNORMAL LOW (ref 39.0–52.0)
Hemoglobin: 9.7 g/dL — ABNORMAL LOW (ref 13.0–17.0)
MCH: 29.2 pg (ref 26.0–34.0)
MCHC: 31.1 g/dL (ref 30.0–36.0)
MCV: 94 fL (ref 80.0–100.0)
Platelets: 235 10*3/uL (ref 150–400)
RBC: 3.32 MIL/uL — ABNORMAL LOW (ref 4.22–5.81)
RDW: 18.3 % — ABNORMAL HIGH (ref 11.5–15.5)
WBC: 7.2 10*3/uL (ref 4.0–10.5)
nRBC: 0.3 % — ABNORMAL HIGH (ref 0.0–0.2)

## 2023-12-08 LAB — BASIC METABOLIC PANEL
Anion gap: 12 (ref 5–15)
BUN: 24 mg/dL — ABNORMAL HIGH (ref 8–23)
CO2: 26 mmol/L (ref 22–32)
Calcium: 9.2 mg/dL (ref 8.9–10.3)
Chloride: 95 mmol/L — ABNORMAL LOW (ref 98–111)
Creatinine, Ser: 8.06 mg/dL — ABNORMAL HIGH (ref 0.61–1.24)
GFR, Estimated: 7 mL/min — ABNORMAL LOW (ref >60)
Glucose, Bld: 235 mg/dL — ABNORMAL HIGH (ref 70–99)
Potassium: 5.4 mmol/L — ABNORMAL HIGH (ref 3.5–5.1)
Sodium: 133 mmol/L — ABNORMAL LOW (ref 135–145)

## 2023-12-08 LAB — GLUCOSE, CAPILLARY
Glucose-Capillary: 197 mg/dL — ABNORMAL HIGH (ref 70–99)
Glucose-Capillary: 130 mg/dL — ABNORMAL HIGH (ref 70–99)
Glucose-Capillary: 182 mg/dL — ABNORMAL HIGH (ref 70–99)
Glucose-Capillary: 178 mg/dL — ABNORMAL HIGH (ref 70–99)

## 2023-12-08 MED ORDER — SODIUM ZIRCONIUM CYCLOSILICATE 10 G PO PACK
10.0000 g | PACK | Freq: Every day | ORAL | Status: DC
  Administered 2023-12-08 – 2023-12-12 (×5): 10 g via ORAL
  Filled 2023-12-08 (×7): qty 1

## 2023-12-08 NOTE — Progress Notes (Signed)
[facility] Progress Note   Subjective:    Completed dialysis yesterday net UF 1.8L   No new complaints this am   Objective Vitals:   12/07/23 2345 12/08/23 0451 12/08/23 0505 12/08/23 0834  BP: (!) 108/59 120/65  (!) 107/59  Pulse: 78 79  75  Resp: 17 18  20   Temp: 98.1 F (36.7 C) 98.5 F (36.9 C)  98 F (36.7 C)  TempSrc: Oral Oral  Oral  SpO2: 96% 96%    Weight:   101 kg   Height:       Physical Exam General: chronically ill appearing male in NAD; lying in bed Heart: RRR,+3/6 systolic murmur Lungs: CTAB, nml WOB on RA Abdomen: soft, NTND Extremities: no LE edema, R BKA Dialysis Access: LU AVF +b/t   Filed Weights   12/07/23 0846 12/07/23 1310 12/08/23 0505  Weight: 103.3 kg 100.7 kg 101 kg    Intake/Output Summary (Last 24 hours) at 12/08/2023 1128 Last data filed at 12/08/2023 0931 Gross per 24 hour  Intake 720 ml  Output 1800 ml  Net -1080 ml    Additional Objective Labs: Basic Metabolic Panel: Recent Labs  Lab 12/06/23 0224 12/07/23 0251 12/07/23 0706 12/08/23 0252  NA 132* 134*  --  133*  K 5.4* 6.4* 5.7* 5.4*  CL 97* 99  --  95*  CO2 24 22  --  26  GLUCOSE 177* 138*  --  235*  BUN 32* 39*  --  24*  CREATININE 10.01* 11.71*  --  8.06*  CALCIUM 10.1 9.8  --  9.2   Liver Function Tests: Recent Labs  Lab 12/02/23 1144 12/03/23 0246  AST 27 35  ALT 20 21  ALKPHOS 90 88  BILITOT 0.6 0.7  PROT 7.2 6.9  ALBUMIN 2.9* 2.8*     CBC: Recent Labs  Lab 12/04/23 0304 12/05/23 0246 12/06/23 0224 12/07/23 0925 12/08/23 0252  WBC 8.0 8.0 9.2 7.7 7.2  HGB 9.5* 10.0* 9.7* 9.4* 9.7*  HCT 30.1* 31.7* 30.4* 29.8* 31.2*  MCV 91.2 92.7 92.1 93.4 94.0  PLT 253 230 264 245 235   Blood Culture    Component Value Date/Time   SDES CSF 11/19/2023 1134   SPECREQUEST NONE 11/19/2023 1134   CULT  11/19/2023 1134    NO GROWTH 3 DAYS Performed at [facility], [address], [place], [place] [address]    REPTSTATUS  11/23/2023 FINAL 11/19/2023 1134    CBG: Recent Labs  Lab 12/07/23 6045 12/07/23 1413 12/07/23 1702 12/07/23 2045 12/08/23 0527  GLUCAP 94 92 180* 171* 197*    Medications:   aspirin EC  81 mg Oral Daily   atorvastatin  40 mg Oral Daily   Chlorhexidine Gluconate Cloth  6 each Topical Q0600   cinacalcet  30 mg Oral Q breakfast   darbepoetin (ARANESP) injection - DIALYSIS  100 mcg Subcutaneous Q Mon-1800   doxercalciferol  3 mcg Intravenous Q M,W,F-HD   feeding supplement  237 mL Oral BID BM   heparin injection (subcutaneous)  5,000 Units Subcutaneous Q8H   insulin aspart  0-5 Units Subcutaneous QHS   insulin aspart  0-9 Units Subcutaneous TID WC   insulin aspart  2 Units Subcutaneous TID WC   insulin glargine-yfgn  25 Units Subcutaneous Daily   midodrine  10 mg Oral TID WC   multivitamin  1 tablet Oral QHS   pantoprazole  40 mg Oral BID   sevelamer carbonate  4,000 mg Oral TID WC  sodium zirconium cyclosilicate  10 g Oral Daily   thiamine  100 mg Oral Daily    Dialysis Orders: [facility] MWF 4h  450/700  105kg   2/2 bath   AVF   Heparin 7000 + 2000 midrun - Hectorol 7 mcg IV three times per week   Assessment/Plan: 1.AMS: Was on cefepime which has been stopped. Per primary team some concern for encephalitis/meningitis, was on empiric antibiotics/antivirals until seen by ID on 11/18/23 and all was stopped.  S/P LP which ruled out bacterail meningitis/encephalitis. He did test + for flu A by resp panel. MRI without acute abnormality.  ID stopped abx and antivirals and signed off. Getting trial of high-dose thiamine. Mental status has sig improved - Much improved 2.  ESRD - usual HD is MWF. Next HD 1/22.  Needs to be able to tolerate HD in chair  3. Hyperkalemia - K up this week. Ordered daily Lokelma 1/21 4. NSTEMI- work up per cardiology/primary. No plan for invasive procedure and not DAPT candidate per cardiology.  Cardiology signed off.  5. Volume -  under EDW. Will need lower dry weight on d/c. UF as tolerated.  6. Fever/Hypotension- BC with NGTD. Was started on board spectrum antibiotics/antivirals but now discontinued.  ID signed off. 7. HFrEF/history of severe AS- attempting to optimize volume with dialysis. ECHO  EF 30 to 35%. Seen by Cards - medical management of AS. Fluid restrictions and daily weights. On midodrine 10 mg 3 times daily.  8. Anemia  - Hgb 9s.  Now stable and GI Following.   Start Fe load 1/14. As per below. Aranesp 100 q Mon -last 1/20.  9. Metabolic bone disease -Continue renvela. VDRA on hold d/t high calcium; coming down. 10. Debility - memory is better however he remains debilitated.Needs to demonstrate HD in chair for return to outpt HD. Not there yet.... 11. GIB/Norovirus - C diff neg. GI following. Bloody BMs reported.  1 unit pRBC given 11/27/23.  Completed capsule endoscopy  Tomasa Blase, PA-C  [facility] 12/08/2023,11:28 AM  LOS: 30 days

## 2023-12-08 NOTE — Progress Notes (Signed)
[facility]                                                                              Shawna Frieders, is a 63 y.o. male, DOB - 1961-06-17, ZOX:[id number] Admit date - 11/08/2023    Outpatient Primary MD for the patient is Pcp, No  LOS - 30  days  Chief Complaint  Patient presents with   Hypotension   Cough   Shortness of Breath       Brief summary   Patient is a 63 y.o. male with PMH significant for ESRD-HD-MWF, DM2, HTN, nonischemic cardiomyopathy, mild systolic CHF, prior right BKA.  Patient presented to ED on 12/22 after experiencing an episode of chest pain, hypotension and near syncope during hemodialysis.  His initial blood pressure was low in 75/59 Troponin was significantly elevated and peaked at 9000, he was diagnosed with NSTEMI, started on heparin drip.  Patient was admitted for further workup.   Hospital course was also complicated altered mental status, influenza, GI bleeding, norovirus diarrhea Patient is currently weak and unable to be in a chair for dialysis. Palliative care consulted.   Assessment & Plan   NSTEMI -Presented with chest pain, hypertension, syncope, significantly elevated troponin  -2D echo 11/09/2023 showed EF 30 -35% with regional WMA, moderate to severe aortic stenosis -Initially started on medical management with IV heparin drip, aspirin and Lipitor -Initial consideration of cardiac cath, was placed on trial of DAPT.  However after single dose of Plavix with aspirin, patient had profuse rectal bleeding, hence DAPT was held.  Per cardiology, no plan for inpatient ischemic evaluation.  This can be revisited as outpatient when GI issues stabilized and cleared to utilize DAPT going forward. -Cleared by GI to place on antiplatelet agent,  -Per cardiology, started on aspirin 81 mg daily, outpatient cardiac evaluation planned.  Continue statin  Acute GI bleeding  Norovirus diarrhea -Patient had several days of GI bleeding, worsened  with DAPT - Bleeding suspected to be combination of norovirus diarrhea, ischemic colitis, no prior colonoscopy.  FOBT positive on 1/3, GI pathogen panel positive for norovirus -CTA GI bleed 1/13 showed no active GI bleeding in the imaging  -Patient was offered EGD, colonoscopy, he declined. -Per GI, Dr. 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  Currently on midodrine  -Metoprolol held due to hypotension.   -Cardiology will follow outpatient.  Continue HD for volume management.    ESRD-HD-MWF -Nephrology following -Continue HD per schedule Patient needs to be able to tolerate dialysis in chair to consider outpatient dialysis.     Influenza A positive -RVP positive for flu A + on 12/30.  Completed the course of Tamiflu.   Acute metabolic encephalopathy, likely due to prolonged delirium -Patient met sepsis criteria on  admission, POA due to tachycardia, fever, tachypnea, altered mental status -Patient hospital stay was complicated by altered mental status.  There was initial concern for encephalitis vs meningitis. Neurology was consulted.  Patient was placed on broad-spectrum antibiotics -EEG 12/27 and 1/1 did not show any evidence of seizure activity. -MRI brain 1/3 negative for acute abnormality  -LP on 1/3,  CSF labs significant for elevated glucose and protein, not consistent with meningitis. Meningitis/encephalitis panel negative for pathogen. -ID and neurology signed off. -Mental status improving.  Sepsis physiology has resolved.   Type 2 diabetes mellitus  uncontrolled -Hemoglobin A1c > 15.5 on 10/24/2023   CBG (last 3)  Recent Labs    12/07/23 2045 12/08/23 0527 12/08/23 1141  GLUCAP 171* 197* 130*   -Continue sensitive SSI, Semglee 25 units daily, continue  NovoLog 2 units 3 times daily AC   H/o CVA Continue Lipitor   Estimated body mass index is 29.38 kg/m as calculated from the following:   Height as of this encounter: 6\' 1"  (1.854 m).   Weight as of this encounter: 101 kg.  Code Status: Full code DVT Prophylaxis:  heparin injection 5,000 Units Start: 12/04/23 1445 Place and maintain sequential compression device Start: 11/20/23 0706 SCDs Start: 11/09/23 1558   Level of Care: Level of care: Progressive Family Communication:  Disposition Plan:      Remains inpatient appropriate:   Need to be able to tolerate HD in chair for outpatient HD center.   Procedures:    Consultants:   Gastroenterology Cardiology Palliative medicine  Antimicrobials:   Anti-infectives (From admission, onward)    Start     Dose/Rate Route Frequency Ordered Stop   11/18/23 1800  oseltamivir (TAMIFLU) capsule 30 mg        30 mg Oral Every M-W-F (Hemodialysis) 11/18/23 1116 11/23/23 1159   11/17/23 1800  oseltamivir (TAMIFLU) capsule 30 mg  Status:  Discontinued        30 mg Oral Every T-Th-Sa (1800) 11/17/23 0800 11/18/23 1116   11/17/23 1115  vancomycin (VANCOCIN) IVPB 1000 mg/200 mL premix  Status:  Discontinued        1,000 mg 200 mL/hr over 60 Minutes Intravenous Once in dialysis 11/17/23 1021 11/18/23 1331   11/17/23 1000  oseltamivir (TAMIFLU) capsule 30 mg        30 mg Oral  Once 11/17/23 0759 11/17/23 0952   11/16/23 1200  vancomycin (VANCOCIN) IVPB 1000 mg/200 mL premix  Status:  Discontinued        1,000 mg 200 mL/hr over 60 Minutes Intravenous Every M-W-F (Hemodialysis) 11/15/23 1658 11/18/23 1306   11/12/23 2300  vancomycin (VANCOCIN) IVPB 1000 mg/200 mL premix        1,000 mg 200 mL/hr over 60 Minutes Intravenous  Once  11/12/23 2226 11/13/23 0131   11/12/23 2000  cefTRIAXone (ROCEPHIN) 2 g in sodium chloride 0.9 % 100 mL IVPB  Status:  Discontinued        2 g 200 mL/hr over 30 Minutes Intravenous Every 12 hours 11/12/23 1627 11/18/23 1306   11/12/23 2000  ampicillin (OMNIPEN) 2 g in sodium chloride 0.9 % 100 mL IVPB  Status:  Discontinued        2 g 300 mL/hr over 20 Minutes Intravenous Every 12 hours 11/12/23 1627 11/18/23 1306   11/12/23 1730  acyclovir (ZOVIRAX) 450 mg in dextrose 5 % 100 mL IVPB  Status:  Discontinued        5 mg/kg  89.6 kg (Adjusted) 109 mL/hr  over 60 Minutes Intravenous Every 24 hours 11/12/23 1627 11/18/23 1306   11/11/23 1915  piperacillin-tazobactam (ZOSYN) IVPB 2.25 g  Status:  Discontinued        2.25 g 100 mL/hr over 30 Minutes Intravenous Every 8 hours 11/11/23 1825 11/12/23 1615   11/11/23 1245  metroNIDAZOLE (FLAGYL) IVPB 500 mg  Status:  Discontinued        500 mg 100 mL/hr over 60 Minutes Intravenous Every 12 hours 11/11/23 1146 11/11/23 1825   11/11/23 1200  vancomycin (VANCOCIN) IVPB 1000 mg/200 mL premix  Status:  Discontinued        1,000 mg 200 mL/hr over 60 Minutes Intravenous Every M-W-F (Hemodialysis) 11/10/23 1458 11/15/23 1658   11/10/23 1545  vancomycin (VANCOCIN) IVPB 1000 mg/200 mL premix       Placed in "Followed by" Linked Group   1,000 mg 200 mL/hr over 60 Minutes Intravenous  Once 11/10/23 1458 11/10/23 1917   11/10/23 1545  vancomycin (VANCOCIN) IVPB 1000 mg/200 mL premix       Placed in "Followed by" Linked Group   1,000 mg 200 mL/hr over 60 Minutes Intravenous  Once 11/10/23 1458 11/10/23 1815   11/10/23 1545  ceFEPIme (MAXIPIME) 1 g in sodium chloride 0.9 % 100 mL IVPB  Status:  Discontinued        1 g 200 mL/hr over 30 Minutes Intravenous Every M-W-F 11/10/23 1458 11/10/23 1520   11/10/23 1545  ceFEPIme (MAXIPIME) 2 g in sodium chloride 0.9 % 100 mL IVPB  Status:  Discontinued        2 g 200 mL/hr over 30 Minutes Intravenous Every M-W-F  11/10/23 1520 11/11/23 1810          Medications  aspirin EC  81 mg Oral Daily   atorvastatin  40 mg Oral Daily   Chlorhexidine Gluconate Cloth  6 each Topical Q0600   cinacalcet  30 mg Oral Q breakfast   darbepoetin (ARANESP) injection - DIALYSIS  100 mcg Subcutaneous Q Mon-1800   doxercalciferol  3 mcg Intravenous Q M,W,F-HD   feeding supplement  237 mL Oral BID BM   heparin injection (subcutaneous)  5,000 Units Subcutaneous Q8H   insulin aspart  0-5 Units Subcutaneous QHS   insulin aspart  0-9 Units Subcutaneous TID WC   insulin aspart  2 Units Subcutaneous TID WC   insulin glargine-yfgn  25 Units Subcutaneous Daily   midodrine  10 mg Oral TID WC   multivitamin  1 tablet Oral QHS   pantoprazole  40 mg Oral BID   sevelamer carbonate  4,000 mg Oral TID WC   sodium zirconium cyclosilicate  10 g Oral Daily   thiamine  100 mg Oral Daily      Subjective:   [name] [name] was seen and examined today.  Overall improving, no acute issues.  Feels stronger.  Objective:   Vitals:   12/08/23 0451 12/08/23 0505 12/08/23 0834 12/08/23 1138  BP: 120/65  (!) 107/59 127/71  Pulse: 79  75 75  Resp: 18  20 20   Temp: 98.5 F (36.9 C)  98 F (36.7 C) 98.2 F (36.8 C)  TempSrc: Oral  Oral Oral  SpO2: 96%   100%  Weight:  101 kg    Height:        Intake/Output Summary (Last 24 hours) at 12/08/2023 1248 Last data filed at 12/08/2023 0931 Gross per 24 hour  Intake 720 ml  Output 1800 ml  Net -1080 ml     Wt  Readings from Last 3 Encounters:  12/08/23 101 kg  10/26/23 110.5 kg  10/22/23 108.9 kg    Physical Exam General: Alert and oriented x 3, NAD Cardiovascular: S1 S2 clear, RRR.  Respiratory: CTAB, no wheezing Gastrointestinal: Soft, nontender, nondistended, NBS Ext: right BKA Neuro: no new deficits Psych: Normal affect     Data Reviewed:  I have personally reviewed following labs    CBC Lab Results  Component Value Date   WBC 7.2 12/08/2023   RBC 3.32 (L)  12/08/2023   HGB 9.7 (L) 12/08/2023   HCT 31.2 (L) 12/08/2023   MCV 94.0 12/08/2023   MCH 29.2 12/08/2023   PLT 235 12/08/2023   MCHC 31.1 12/08/2023   RDW 18.3 (H) 12/08/2023   LYMPHSABS 1.8 11/30/2023   MONOABS 1.7 (H) 11/30/2023   EOSABS 0.4 11/30/2023   BASOSABS 0.1 11/30/2023     Last metabolic panel Lab Results  Component Value Date   NA 133 (L) 12/08/2023   K 5.4 (H) 12/08/2023   CL 95 (L) 12/08/2023   CO2 26 12/08/2023   BUN 24 (H) 12/08/2023   CREATININE 8.06 (H) 12/08/2023   GLUCOSE 235 (H) 12/08/2023   GFRNONAA 7 (L) 12/08/2023   GFRAA 3 (L) 08/14/2019   CALCIUM 9.2 12/08/2023   PHOS 8.0 (H) 11/23/2023   PROT 6.9 12/03/2023   ALBUMIN 2.8 (L) 12/03/2023   LABGLOB 5.1 (H) 12/17/2022   AGRATIO 0.7 11/20/2022   BILITOT 0.7 12/03/2023   ALKPHOS 88 12/03/2023   AST 35 12/03/2023   ALT 21 12/03/2023   ANIONGAP 12 12/08/2023    CBG (last 3)  Recent Labs    12/07/23 2045 12/08/23 0527 12/08/23 1141  GLUCAP 171* 197* 130*      Coagulation Profile: No results for input(s): "INR", "PROTIME" in the last 168 hours.   Radiology Studies: I have personally reviewed the imaging studies  DG Abd Portable 1V Result Date: 12/06/2023 CLINICAL DATA:  Abdominal pain EXAM: PORTABLE ABDOMEN - 1 VIEW COMPARISON:  02/11/2022 FINDINGS: Nonobstructive bowel gas pattern. No organomegaly or visible free air. Diffuse vascular calcifications noted. IMPRESSION: No acute findings. Electronically Signed   By: Charlett Nose M.D.   On: 12/06/2023 17:10        Tumeka Chimenti M.D. [facility] 12/08/2023, 12:48 PM  Available via Epic secure chat 7am-7pm After 7 pm, please refer to night coverage provider listed on amion.

## 2023-12-08 NOTE — Progress Notes (Signed)
Case discussed with CSW, nephrologist, and renal PA. Per PT note this afternoon, pt sitting in chair in room from 10:15 - 3:00 today. Update provided to above team members. Will assist as needed.   Olivia Canter Renal Navigator [phone]

## 2023-12-08 NOTE — Progress Notes (Signed)
Physical Therapy Treatment Patient Details Name: [name] JOYNT Sr. MRN: [id number] DOB: Feb 20, 1961 Today's Date: 12/08/2023   History of Present Illness Pt is 63 year old presented to [facility] on  11/08/23 after experiencing a near syncope during HD. Pt being worked up for an NSTEMI pt unable to tolerate medication and no longer plan to perform cardiac cath, and fever with acute metabolic encephalopathy.  Pt recently admitted 12/6-12/10/24 for a fall. PMH - htn, cardiomyopathy, CHF, dm, esrd on hd, R BKA, CVA    PT Comments  Pt admitted with above diagnosis. Pt progressing and able to stand pivot with prosthesis on right LE with min assist of 2 persons and RW use. Pt will try to stay in recliner for 4 hours as well.  Called [employer] to get socks and shrinker for pt as well as contacted CM to see if they could see if they can find out what kind of wheelchair he has to get a cushion for his amputee wheelchair as he will need one for HD.  Will follow pt acutely and continue to progress pt as able.   Pt currently with functional limitations due to the deficits listed below (see PT Problem List). Pt will benefit from acute skilled PT to increase their independence and safety with mobility to allow discharge.       If plan is discharge home, recommend the following: Two people to help with walking and/or transfers;Assistance with cooking/housework;Assist for transportation;Help with stairs or ramp for entrance;Supervision due to cognitive status   Can travel by private vehicle        Equipment Recommendations  Other (comment);Hoyer lift;Wheelchair (measurements PT);Wheelchair cushion (measurements PT);Hospital bed (defer to post acute)    Recommendations for Other Services       Precautions / Restrictions Precautions Precautions: Fall Precaution Comments: DROPLET - flu and Norovirus precatuions Required Braces or Orthoses: Other Brace Other Brace: R prosthetic - tried this today and it did fit but is  snug; able to contact [employer] for socks and a shrinker Restrictions Weight Bearing Restrictions Per Provider Order: No RLE Weight Bearing Per Provider Order: Non weight bearing Other Position/Activity Restrictions: R BKA     Mobility  Bed Mobility Overal bed mobility: Needs Assistance Bed Mobility: Rolling Rolling: Used rails, Contact guard assist Sidelying to sit: Used rails, +2 for physical assistance, Min assist       General bed mobility comments: Less assist needed to come to sitting. Mod cues for technique and definite need of rails    Transfers Overall transfer level: Needs assistance Equipment used: Rolling walker (2 wheels) Transfers: Bed to chair/wheelchair/BSC Sit to Stand: +2 physical assistance, Min assist, From elevated surface Stand pivot transfers: +2 safety/equipment, Min assist         General transfer comment: Initially tried to get pt to stand to [facility] with left LE use and bil UE use and pt could clear buttocks but could not stand upright all the way.  Pt requested to try his prosthetic in the closet therefore PT got the prosthesis out and it did fit once he stood to RW with min assist of 2.  Pt was able to take pivotal steps to the recliner with +2 assist. Tried to get pt to stand again and pt declined.  Encouraged pt to weight shift frequently as well as to perform glut and quad sets.  Pt agrees.    Ambulation/Gait               General  Gait Details: unable   Stairs             Wheelchair Mobility     Tilt Bed    Modified Rankin (Stroke Patients Only)       Balance Overall balance assessment: Needs assistance Sitting-balance support: Feet supported, Bilateral upper extremity supported, Single extremity supported Sitting balance-Leahy Scale: Fair Sitting balance - Comments: Once in sitting pt was able to sit with CGA for at least 8 min   Standing balance support: Bilateral upper extremity supported, Reliant on assistive device for  balance, During functional activity Standing balance-Leahy Scale: Poor Standing balance comment: Able to come to standing with min assist of 2 and RW use with prosthesis on                            Cognition Arousal: Alert Behavior During Therapy: WFL for tasks assessed/performed Overall Cognitive Status: Impaired/Different from baseline Area of Impairment: Memory, Following commands, Safety/judgement, Awareness, Problem solving                 Orientation Level: Disoriented to, Situation, Time Current Attention Level: Sustained Memory: Decreased short-term memory Following Commands: Follows one step commands with increased time Safety/Judgement: Decreased awareness of safety, Decreased awareness of deficits Awareness: Intellectual Problem Solving: Slow processing, Decreased initiation, Requires verbal cues          Exercises Amputee Exercises Quad Sets: AROM, Both, 5 reps Gluteal Sets: AROM, Both, 5 reps, Supine Knee Extension: AROM, Both, Seated, 5 reps Other Exercises Other Exercises: encouraged armchair pushups for UE strength,  seated in recliner    General Comments General comments (skin integrity, edema, etc.): Called [employer] to ask for them to bring socks and a shrinker for pt to progress ambulation and transfers with pt.      Pertinent Vitals/Pain Pain Assessment Pain Assessment: Faces Faces Pain Scale: Hurts little more Breathing: normal Negative Vocalization: none Facial Expression: smiling or inexpressive Body Language: relaxed Consolability: no need to console PAINAD Score: 0 Pain Location: L hand Pain Descriptors / Indicators: Guarding, Grimacing, Discomfort Pain Intervention(s): Limited activity within patient's tolerance, Monitored during session, Repositioned    Home Living                          Prior Function            PT Goals (current goals can now be found in the care plan section) Acute Rehab PT  Goals Patient Stated Goal: unable to state Progress towards PT goals: Progressing toward goals    Frequency    Min 1X/week      PT Plan      Co-evaluation              AM-PAC PT "6 Clicks" Mobility   Outcome Measure  Help needed turning from your back to your side while in a flat bed without using bedrails?: A Little Help needed moving from lying on your back to sitting on the side of a flat bed without using bedrails?: Total Help needed moving to and from a bed to a chair (including a wheelchair)?: Total Help needed standing up from a chair using your arms (e.g., wheelchair or bedside chair)?: Total Help needed to walk in hospital room?: Total Help needed climbing 3-5 steps with a railing? : Total 6 Click Score: 8    End of Session Equipment Utilized During Treatment: Gait belt Activity Tolerance: Patient  limited by fatigue;Patient limited by pain Patient left: in chair;with call bell/phone within reach;with chair alarm set Nurse Communication: Need for lift equipment;Mobility status (maximove recommended if needed) PT Visit Diagnosis: Muscle weakness (generalized) (M62.81);Difficulty in walking, not elsewhere classified (R26.2);Pain Pain - Right/Left: Left Pain - part of body: Hand     Time: 1610-9604 PT Time Calculation (min) (ACUTE ONLY): 32 min  Charges:    $Therapeutic Exercise: 8-22 mins $Therapeutic Activity: 8-22 mins PT General Charges $$ ACUTE PT VISIT: 1 Visit                     Ann Bohne M,PT Acute Rehab Services [phone]    [name] [name] 12/08/2023, 12:36 PM

## 2023-12-08 NOTE — Progress Notes (Signed)
Orthopedic Tech Progress Note Patient Details:  [name] 14-Dec-1960 [id number]  Called in order to [employer] for a BKA SHRINKER as well as  "SOCK" assuming a RETENTION SOCK   Patient ID: [name] MIYAHIRA Sr., male   DOB: June 17, 1961, 63 y.o.   MRN: [id number]  Donald Pore 12/08/2023, 11:45 AM

## 2023-12-08 NOTE — Progress Notes (Signed)
Physical Therapy Treatment Patient Details Name: [name] SCHLOSSBERG Sr. MRN: [id number] DOB: 1961/03/26 Today's Date: 12/08/2023   History of Present Illness Pt is 63 year old presented to [facility] on  11/08/23 after experiencing a near syncope during HD. Pt being worked up for an NSTEMI pt unable to tolerate medication and no longer plan to perform cardiac cath, and fever with acute metabolic encephalopathy.  Pt recently admitted 12/6-12/10/24 for a fall. PMH - htn, cardiomyopathy, CHF, dm, esrd on hd, R BKA, CVA    PT Comments  Pt tolerated sitting up in chair for >4hrs (10:15 am - 3 pm). Pt found soiled in chair, engaged in prolonged standing with RW and modA while personal hygiene was addressed. Pt took increased time and required VC/TC to achieve upright posture with equal pressure through BUE on RW. Pt is impulsive, eager to complete chair to bed transfer secondary to fatigue. He denied the option of a seated recovery prior to returning to bed and displayed poor safety awareness. Re-education pt on safe [place] of a RW. Pt assisted with repositioning in bed with cueing. Patient will benefit from continued inpatient follow up therapy, <3 hours/day. Will continue to follow acutely and progress as appropriate.    If plan is discharge home, recommend the following: Two people to help with walking and/or transfers;Assistance with cooking/housework;Assist for transportation;Help with stairs or ramp for entrance;Supervision due to cognitive status   Can travel by private vehicle        Equipment Recommendations  Other (comment);Hoyer lift;Wheelchair (measurements PT);Wheelchair cushion (measurements PT);Hospital bed (defer to post acute)    Recommendations for Other Services       Precautions / Restrictions Precautions Precautions: Fall Precaution Comments: DROPLET - flu and Norovirus precatuions Required Braces or Orthoses: Other Brace Other Brace: R BKA prosthesis Restrictions Weight Bearing  Restrictions Per Provider Order: No Other Position/Activity Restrictions: R BKA     Mobility  Bed Mobility Overal bed mobility: Needs Assistance Bed Mobility: Sit to Supine Rolling: Used rails, Contact guard assist     Sit to supine: Used rails, Contact guard assist   General bed mobility comments: Utilized bed features. PT doffed R sock and prosthesis and L shoe for pt secondary to fatigue. Pt assisted in scooting up in bed after he was cued on hand placement.    Transfers Overall transfer level: Needs assistance Equipment used: Rolling walker (2 wheels) Transfers: Bed to chair/wheelchair/BSC     Step pivot transfers: +2 safety/equipment, Mod assist       General transfer comment: Pt stood up from recliner chair with modA and use of RW. Extended time spent in standing to address personal hygiene. VC/TC to correct pt's posture, required increased time to obtain upright position. Educated pt to push down through his BUE on the AD. Pt began to side step to bed and denied a seated rest break. Pt displayed poor safety awareness, pivoting quickly to the bed secondary to fatigue and having the RW positioned to far in front of him. Will benefit from continued education on AD use.    Ambulation/Gait                   Stairs             Wheelchair Mobility     Tilt Bed    Modified Rankin (Stroke Patients Only)       Balance Overall balance assessment: Needs assistance         Standing balance support: Bilateral  upper extremity supported, Reliant on assistive device for balance, During functional activity Standing balance-Leahy Scale: Poor Standing balance comment: Pt had 1 minor LOB posterior corrected with modA. Facilitated increased WBing into BUE on RW as well as VC/TC posture correction. Pt maintained balance throughout rest of chair>bed tsf, but is impulsive requiring physical assistance for safety.                            Cognition  Arousal: Alert Behavior During Therapy: Impulsive Overall Cognitive Status: Impaired/Different from baseline Area of Impairment: Following commands, Safety/judgement, Awareness, Problem solving                       Following Commands: Follows one step commands inconsistently, Follows one step commands with increased time Safety/Judgement: Decreased awareness of safety, Decreased awareness of deficits   Problem Solving: Requires verbal cues, Requires tactile cues General Comments: Pt quick to initiate movement attempting to transfer back to the bed from the chair before personal hygiene was completely addressed.        Exercises      General Comments General comments (skin integrity, edema, etc.): Pt tolerated the session fairly well, eager to get back to bed.      Pertinent Vitals/Pain Pain Assessment Pain Assessment: No/denies pain    Home Living                          Prior Function            PT Goals (current goals can now be found in the care plan section) Acute Rehab PT Goals Patient Stated Goal: I would like to try getting myself to a w/c tomorrow. Time For Goal Achievement: 12/15/23 Potential to Achieve Goals: Fair Progress towards PT goals: Progressing toward goals    Frequency    Min 1X/week      PT Plan      Co-evaluation              AM-PAC PT "6 Clicks" Mobility   Outcome Measure  Help needed turning from your back to your side while in a flat bed without using bedrails?: A Little Help needed moving from lying on your back to sitting on the side of a flat bed without using bedrails?: Total Help needed moving to and from a bed to a chair (including a wheelchair)?: Total Help needed standing up from a chair using your arms (e.g., wheelchair or bedside chair)?: Total Help needed to walk in hospital room?: Total Help needed climbing 3-5 steps with a railing? : Total 6 Click Score: 8    End of Session Equipment  Utilized During Treatment: Gait belt Activity Tolerance: Patient limited by fatigue Patient left: in bed;with bed alarm set;with call bell/phone within reach Nurse Communication: Mobility status PT Visit Diagnosis: Muscle weakness (generalized) (M62.81);Difficulty in walking, not elsewhere classified (R26.2);Pain Pain - Right/Left: Left Pain - part of body: Hand     Time: 5284-1324 PT Time Calculation (min) (ACUTE ONLY): 18 min  Charges:    $Therapeutic Exercise: 8-22 mins $Therapeutic Activity: 8-22 mins PT General Charges $$ ACUTE PT VISIT: 1 Visit                     Cheri Guppy, PT, DPT Acute Rehabilitation Services Office: [phone] Secure Chat Preferred   Richardson Chiquito 12/08/2023, 3:28 PM

## 2023-12-08 NOTE — Plan of Care (Signed)
  Problem: Safety: Goal: Ability to remain free from injury will improve Outcome: Progressing   Problem: Skin Integrity: Goal: Risk for impaired skin integrity will decrease Outcome: Progressing   Problem: Education: Goal: Ability to describe self-care measures that may prevent or decrease complications (Diabetes Survival Skills Education) will improve Outcome: Progressing   Problem: Metabolic: Goal: Ability to maintain appropriate glucose levels will improve Outcome: Progressing

## 2023-12-08 NOTE — Progress Notes (Signed)
Nutrition Follow-up  DOCUMENTATION CODES:   Not applicable  INTERVENTION:  Continue Ensure Enlive po BID, each supplement provides 350 kcal and 20 grams of protein. Magic cup TID with meals, each supplement provides 290 kcal and 9 grams of protein Renal MVI with minerals daily  NUTRITION DIAGNOSIS:   Inadequate oral intake related to acute illness as evidenced by other (comment), energy intake < 75% for > 7 days (full liquid diet). - progressing, now on soft diet  GOAL:   Patient will meet greater than or equal to 90% of their needs - unmet, addressing via meals and nutrition supplements   MONITOR:   PO intake, Labs, Supplement acceptance, Weight trends, Skin  REASON FOR ASSESSMENT:   Consult Poor PO, Assessment of nutrition requirement/status  ASSESSMENT:   63y.o. male with PMH: ESRD on HD, HF, T2DM, s/p RBKA and obesity. Experienced syncope/SOB during HD. Recently hospitalized from 12/06-12/10 for uncontrolled hyperglycemia. Found to have very high troponin. Dx of NSTEMI.  12/22 Admitted to [facility] 12/23 Echocardiogram, transferred to [facility] 12/25 Cognition declined 01/03 Lumbar puncture d/t worsening cognition: CSF labs significant for elevated glucose and protein; Meningitis/encephalitis panel negative for pathogen.  01/13 CTA GI bleed showed no active GI bleeding in the imaging  01/19 KUB- no acute findings  Discharge pending tolerance of HD in a chair.   Pt sitting up in bedside recliner at time of visit. Oriented x4. He is a limited historian. Pt endorses eating his meals however noted documentation of PO intake is variable. He reports that he continues to consume nutrition supplements 2-3 times daily. No new/additional nutrition related concerns per pt at this time. Will continue with current nutrition interventions.   Meal completions: 1/17: 85% breakfast, 90% lunch, 90% dinner 1/19: 0% breakfast 1/20: 30% breakfast, 40% lunch, 75% dinner 1/21: 0%  breakfast  Last HD session 1/20 Net UF: 1.8L  Post HD weight: 100.7 kg EDW: 105 kg  Medications: cinacalet, SSI 0-5 units at bedtime, SSI 0-9 units TID, SSI 2 units TID, semglee 25 units daily, rena-vit, renvela, lokelma, thiamine  Labs:  Sodium 133 Potassium 5.4 (H) BUN 24 Cr 8.06 GFR 7 CBG's 92-197 x24 hours HgbA1c >15.5% (10/24/23)  Diet Order:   Diet Order             DIET SOFT Fluid consistency: Thin; Fluid restriction: 2000 mL Fluid  Diet effective now                   EDUCATION NEEDS:   Not appropriate for education at this time  Skin:  Skin Assessment: Reviewed RN Assessment  Last BM:  1/20  Height:   Ht Readings from Last 1 Encounters:  12/02/23 6\' 1"  (1.854 m)    Weight:   Wt Readings from Last 1 Encounters:  12/08/23 101 kg    Ideal Body Weight:  83.6 kg  BMI:  Body mass index is 29.38 kg/m.  Estimated Nutritional Needs:   Kcal:  2000-2200kcal  Protein:  100-110g protein  Fluid:  1 L + UOP  Drusilla Kanner, RDN, LDN Clinical Nutrition

## 2023-12-08 NOTE — Plan of Care (Signed)
  Problem: Education: Goal: Knowledge of General Education information will improve Description: Including pain rating scale, medication(s)/side effects and non-pharmacologic comfort measures Outcome: Progressing   Problem: Health Behavior/Discharge Planning: Goal: Ability to manage health-related needs will improve Outcome: Progressing   Problem: Clinical Measurements: Goal: Ability to maintain clinical measurements within normal limits will improve Outcome: Progressing Goal: Will remain free from infection Outcome: Progressing Goal: Diagnostic test results will improve Outcome: Progressing Goal: Respiratory complications will improve Outcome: Progressing Goal: Cardiovascular complication will be avoided Outcome: Progressing   Problem: Activity: Goal: Risk for activity intolerance will decrease Outcome: Progressing   Problem: Nutrition: Goal: Adequate nutrition will be maintained Outcome: Progressing   Problem: Coping: Goal: Level of anxiety will decrease Outcome: Progressing   Problem: Elimination: Goal: Will not experience complications related to bowel motility Outcome: Progressing Goal: Will not experience complications related to urinary retention Outcome: Progressing   Problem: Pain Management: Goal: General experience of comfort will improve Outcome: Progressing   Problem: Safety: Goal: Ability to remain free from injury will improve Outcome: Progressing   Problem: Skin Integrity: Goal: Risk for impaired skin integrity will decrease Outcome: Progressing   Problem: Education: Goal: Ability to describe self-care measures that may prevent or decrease complications (Diabetes Survival Skills Education) will improve Outcome: Progressing Goal: Individualized Educational Video(s) Outcome: Progressing   Problem: Coping: Goal: Ability to adjust to condition or change in health will improve Outcome: Progressing   Problem: Fluid Volume: Goal: Ability to  maintain a balanced intake and output will improve Outcome: Progressing   Problem: Health Behavior/Discharge Planning: Goal: Ability to identify and utilize available resources and services will improve Outcome: Progressing Goal: Ability to manage health-related needs will improve Outcome: Progressing   Problem: Metabolic: Goal: Ability to maintain appropriate glucose levels will improve Outcome: Progressing   Problem: Nutritional: Goal: Maintenance of adequate nutrition will improve Outcome: Progressing Goal: Progress toward achieving an optimal weight will improve Outcome: Progressing   Problem: Skin Integrity: Goal: Risk for impaired skin integrity will decrease Outcome: Progressing   Problem: Tissue Perfusion: Goal: Adequacy of tissue perfusion will improve Outcome: Progressing   Problem: Safety: Goal: Non-violent Restraint(s) Outcome: Progressing   Problem: Education: Goal: Ability to identify signs and symptoms of gastrointestinal bleeding will improve Outcome: Progressing   Problem: Bowel/Gastric: Goal: Will show no signs and symptoms of gastrointestinal bleeding Outcome: Progressing   Problem: Fluid Volume: Goal: Will show no signs and symptoms of excessive bleeding Outcome: Progressing   Problem: Clinical Measurements: Goal: Complications related to the disease process, condition or treatment will be avoided or minimized Outcome: Progressing

## 2023-12-09 DIAGNOSIS — I214 Non-ST elevation (NSTEMI) myocardial infarction: Secondary | ICD-10-CM | POA: Diagnosis not present

## 2023-12-09 DIAGNOSIS — G903 Multi-system degeneration of the autonomic nervous system: Secondary | ICD-10-CM

## 2023-12-09 DIAGNOSIS — Z992 Dependence on renal dialysis: Secondary | ICD-10-CM | POA: Diagnosis not present

## 2023-12-09 DIAGNOSIS — N186 End stage renal disease: Secondary | ICD-10-CM | POA: Diagnosis not present

## 2023-12-09 LAB — GLUCOSE, CAPILLARY
Glucose-Capillary: 245 mg/dL — ABNORMAL HIGH (ref 70–99)
Glucose-Capillary: 108 mg/dL — ABNORMAL HIGH (ref 70–99)
Glucose-Capillary: 108 mg/dL — ABNORMAL HIGH (ref 70–99)
Glucose-Capillary: 142 mg/dL — ABNORMAL HIGH (ref 70–99)

## 2023-12-09 LAB — BASIC METABOLIC PANEL
Anion gap: 13 (ref 5–15)
BUN: 30 mg/dL — ABNORMAL HIGH (ref 8–23)
CO2: 24 mmol/L (ref 22–32)
Calcium: 9.6 mg/dL (ref 8.9–10.3)
Chloride: 93 mmol/L — ABNORMAL LOW (ref 98–111)
Creatinine, Ser: 9.6 mg/dL — ABNORMAL HIGH (ref 0.61–1.24)
GFR, Estimated: 6 mL/min — ABNORMAL LOW (ref >60)
Glucose, Bld: 240 mg/dL — ABNORMAL HIGH (ref 70–99)
Potassium: 5.4 mmol/L — ABNORMAL HIGH (ref 3.5–5.1)
Sodium: 130 mmol/L — ABNORMAL LOW (ref 135–145)

## 2023-12-09 LAB — MISC LABCORP TEST (SEND OUT): Labcorp test code: 9985

## 2023-12-09 LAB — CBC
HCT: 31.2 % — ABNORMAL LOW (ref 39.0–52.0)
Hemoglobin: 9.8 g/dL — ABNORMAL LOW (ref 13.0–17.0)
MCH: 29.3 pg (ref 26.0–34.0)
MCHC: 31.4 g/dL (ref 30.0–36.0)
MCV: 93.4 fL (ref 80.0–100.0)
Platelets: 239 10*3/uL (ref 150–400)
RBC: 3.34 MIL/uL — ABNORMAL LOW (ref 4.22–5.81)
RDW: 18.3 % — ABNORMAL HIGH (ref 11.5–15.5)
WBC: 6.8 10*3/uL (ref 4.0–10.5)
nRBC: 0 % (ref 0.0–0.2)

## 2023-12-09 LAB — HEPATITIS B SURFACE ANTIGEN: Hepatitis B Surface Ag: NONREACTIVE

## 2023-12-09 MED ORDER — ANTICOAGULANT SODIUM CITRATE 4% (200MG/5ML) IV SOLN: 5.0000 mL | Status: DC | PRN

## 2023-12-09 MED ORDER — LIDOCAINE HCL (PF) 1 % IJ SOLN: 5.0000 mL | INTRAMUSCULAR | Status: DC | PRN

## 2023-12-09 MED ORDER — LIDOCAINE-PRILOCAINE 2.5-2.5 % EX CREA: 1.0000 | TOPICAL_CREAM | CUTANEOUS | Status: DC | PRN

## 2023-12-09 MED ORDER — PENTAFLUOROPROP-TETRAFLUOROETH EX AERO: 1.0000 | INHALATION_SPRAY | CUTANEOUS | Status: DC | PRN

## 2023-12-09 MED ORDER — ALTEPLASE 2 MG IJ SOLR: 2.0000 mg | Freq: Once | INTRAMUSCULAR | Status: DC | PRN

## 2023-12-09 MED ORDER — HEPARIN SODIUM (PORCINE) 1000 UNIT/ML DIALYSIS: 1000.0000 [IU] | INTRAMUSCULAR | Status: DC | PRN

## 2023-12-09 NOTE — Progress Notes (Signed)
PT Cancellation Note  Patient Details Name: [name] DOBEK Sr. MRN: [id number] DOB: 1961-07-07   Cancelled Treatment:    Reason Eval/Treat Not Completed: Patient at procedure or test/unavailable. Pt off floor for HD. Will follow-up as time allows.   Cheri Guppy, PT, DPT Acute Rehabilitation Services Office: [phone] Secure Chat Preferred   Richardson Chiquito 12/09/2023, 8:45 AM

## 2023-12-09 NOTE — Progress Notes (Signed)
[facility] Progress Note   Subjective:    Seen in [facility]. Chair dialysis ordered but in bed for HD this am No new complaints. He says he wasn't asked to get into the chair   Objective Vitals:   12/09/23 0300 12/09/23 0444 12/09/23 0825 12/09/23 0838  BP:  115/68 108/62 116/66  Pulse:  73 76 73  Resp: (!) 0 20 16 (!) 22  Temp:  97.7 F (36.5 C) 98.5 F (36.9 C)   TempSrc:  Oral    SpO2:  99% 93% 96%  Weight: 101.2 kg  103 kg   Height:       Physical Exam General: chronically ill appearing male in NAD; lying in bed Heart: RRR,+3/6 systolic murmur Lungs: CTAB, nml WOB on RA Abdomen: soft, NTND Extremities: no LE edema, R BKA Dialysis Access: LU AVF +b/t   Filed Weights   12/08/23 0505 12/09/23 0300 12/09/23 0825  Weight: 101 kg 101.2 kg 103 kg    Intake/Output Summary (Last 24 hours) at 12/09/2023 0852 Last data filed at 12/08/2023 1257 Gross per 24 hour  Intake 240 ml  Output --  Net 240 ml    Additional Objective Labs: Basic Metabolic Panel: Recent Labs  Lab 12/07/23 0251 12/07/23 0706 12/08/23 0252 12/09/23 0253  NA 134*  --  133* 130*  K 6.4* 5.7* 5.4* 5.4*  CL 99  --  95* 93*  CO2 22  --  26 24  GLUCOSE 138*  --  235* 240*  BUN 39*  --  24* 30*  CREATININE 11.71*  --  8.06* 9.60*  CALCIUM 9.8  --  9.2 9.6   Liver Function Tests: Recent Labs  Lab 12/02/23 1144 12/03/23 0246  AST 27 35  ALT 20 21  ALKPHOS 90 88  BILITOT 0.6 0.7  PROT 7.2 6.9  ALBUMIN 2.9* 2.8*     CBC: Recent Labs  Lab 12/05/23 0246 12/06/23 0224 12/07/23 0925 12/08/23 0252 12/09/23 0253  WBC 8.0 9.2 7.7 7.2 6.8  HGB 10.0* 9.7* 9.4* 9.7* 9.8*  HCT 31.7* 30.4* 29.8* 31.2* 31.2*  MCV 92.7 92.1 93.4 94.0 93.4  PLT 230 264 245 235 239   Blood Culture    Component Value Date/Time   SDES CSF 11/19/2023 1134   SPECREQUEST NONE 11/19/2023 1134   CULT  11/19/2023 1134    NO GROWTH 3 DAYS Performed at [facility], [address]  [place], [place] [address]    REPTSTATUS 11/23/2023 FINAL 11/19/2023 1134    CBG: Recent Labs  Lab 12/08/23 0527 12/08/23 1141 12/08/23 1559 12/08/23 2109 12/09/23 0605  GLUCAP 197* 130* 182* 178* 245*    Medications:  anticoagulant sodium citrate      aspirin EC  81 mg Oral Daily   atorvastatin  40 mg Oral Daily   Chlorhexidine Gluconate Cloth  6 each Topical Q0600   cinacalcet  30 mg Oral Q breakfast   darbepoetin (ARANESP) injection - DIALYSIS  100 mcg Subcutaneous Q Mon-1800   doxercalciferol  3 mcg Intravenous Q M,W,F-HD   feeding supplement  237 mL Oral BID BM   heparin injection (subcutaneous)  5,000 Units Subcutaneous Q8H   insulin aspart  0-5 Units Subcutaneous QHS   insulin aspart  0-9 Units Subcutaneous TID WC   insulin aspart  2 Units Subcutaneous TID WC   insulin glargine-yfgn  25 Units Subcutaneous Daily   midodrine  10 mg Oral TID WC   multivitamin  1 tablet Oral QHS  pantoprazole  40 mg Oral BID   sevelamer carbonate  4,000 mg Oral TID WC   sodium zirconium cyclosilicate  10 g Oral Daily   thiamine  100 mg Oral Daily    Dialysis Orders: [facility] MWF 4h  450/700  105kg   2/2 bath   AVF   Heparin 7000 + 2000 midrun - Hectorol 7 mcg IV three times per week   Assessment/Plan: 1.AMS: Was on cefepime which has been stopped. Per primary team some concern for encephalitis/meningitis, was on empiric antibiotics/antivirals until seen by ID on 11/18/23 and all was stopped.  S/P LP which ruled out bacterail meningitis/encephalitis. He did test + for flu A by resp panel. MRI without acute abnormality.  ID stopped abx and antivirals and signed off. Getting trial of high-dose thiamine. Mental status has sig improved - Much improved 2.  ESRD - usual HD is MWF. Next HD 1/22.  Needs to be able to tolerate HD in chair. Unclear why he wasn't in chair today. Will try again next HD 3. Hyperkalemia - K up this week. Ordered daily Lokelma 1/21 4. NSTEMI- work up  per cardiology/primary. No plan for invasive procedure and not DAPT candidate per cardiology.  Cardiology signed off.  5. Volume - under EDW. Will need lower dry weight on d/c. UF as tolerated.  6. Fever/Hypotension- BC with NGTD. Was started on board spectrum antibiotics/antivirals but now discontinued.  ID signed off. 7. HFrEF/history of severe AS- attempting to optimize volume with dialysis. ECHO  EF 30 to 35%. Seen by Cards - medical management of AS. Fluid restrictions and daily weights. On midodrine 10 mg 3 times daily.  8. Anemia  - Hgb 9s.  Now stable and GI Following.   Start Fe load 1/14. As per below. Aranesp 100 q Mon -last 1/20.  9. Metabolic bone disease -Continue renvela. VDRA on hold d/t high calcium; coming down. 10. Debility - memory is better however he remains debilitated.Needs to demonstrate HD in chair for return to outpt HD.  11. GIB/Norovirus - C diff neg. GI following. Bloody BMs reported.  1 unit pRBC given 11/27/23.  Completed capsule endoscopy  Tomasa Blase, PA-C  [facility] 12/09/2023,8:52 AM  LOS: 31 days

## 2023-12-09 NOTE — TOC Progression Note (Signed)
Transition of Care (TOC) - Progression Note    Patient Details  Name: [name] MANLEY Sr. MRN: [id number] Date of Birth: Mar 06, 1961  Transition of Care [facility]) CM/SW Contact  Michaela Corner, [place] Phone Number: 12/09/2023, 10:18 AM  Clinical Narrative:   Per daily meeting with treatment team, no change in status. Awaiting pt to tolerarte HD chair.  CSW informed Kia, at [facility], pt will need a w/c cushion per PT.     Expected Discharge Plan: Skilled Nursing Facility Barriers to Discharge: Continued Medical Work up  Expected Discharge Plan and Services       Living arrangements for the past 2 months: Single Family Home                           HH Arranged:  (Active with [facility])           Social Determinants of Health (SDOH) Interventions SDOH Screenings   Food Insecurity: Patient Unable To Answer (11/10/2023)  Housing: Patient Unable To Answer (11/10/2023)  Transportation Needs: Unknown (11/10/2023)  Utilities: Patient Unable To Answer (11/10/2023)  Alcohol Screen: Low Risk  (02/04/2022)  Depression (PHQ2-9): Low Risk  (08/06/2022)  Tobacco Use: Low Risk  (12/02/2023)    Readmission Risk Interventions    11/05/2022    3:08 PM  Readmission Risk Prevention Plan  Transportation Screening Complete  Medication Review (RN Care Manager) Complete  PCP or Specialist appointment within 3-5 days of discharge Complete  HRI or Home Care Consult Complete  SW Recovery Care/Counseling Consult Complete  Palliative Care Screening Complete  Skilled Nursing Facility Complete

## 2023-12-09 NOTE — Progress Notes (Signed)
[facility]                                                                              [name] [name], is a 63 y.o. male, DOB - 1961-10-21, UJW:[id number] Admit date - 11/08/2023    Outpatient Primary MD for the patient is Pcp, No  LOS - 31  days  Chief Complaint  Patient presents with   Hypotension   Cough   Shortness of Breath       Brief summary   Patient is a 63 y.o. male with PMH significant for ESRD-HD-MWF, DM2, HTN, nonischemic cardiomyopathy, mild systolic CHF, prior right BKA.  Patient presented to ED on 12/22 after experiencing an episode of chest pain, hypotension and near syncope during hemodialysis.  His initial blood pressure was low in 75/59 Troponin was significantly elevated and peaked at 9000, he was diagnosed with NSTEMI, started on heparin drip.  Patient was admitted for further workup.   Hospital course was also complicated altered mental status, influenza, GI bleeding, norovirus diarrhea Patient is currently weak and unable to be in a chair for dialysis. Palliative care consulted.   Assessment & Plan   NSTEMI -Presented with chest pain, hypertension, syncope, significantly elevated troponin  -2D echo 11/09/2023 showed EF 30 -35% with regional WMA, moderate to severe aortic stenosis -Initially started on medical management with IV heparin drip, aspirin and Lipitor -Initial consideration of cardiac cath, was placed on trial of DAPT.  However after single dose of Plavix with aspirin, patient had profuse rectal bleeding, hence DAPT was held.  Per cardiology, no plan for inpatient ischemic evaluation.  This can be revisited as outpatient when GI issues stabilized and cleared to utilize DAPT going forward. -Cleared by GI to place on antiplatelet agent,  -Per cardiology, started on aspirin 81 mg daily, outpatient cardiac evaluation planned.  Continue statin  Acute GI bleeding  Norovirus diarrhea -Patient had several days of GI bleeding, worsened  with DAPT - Bleeding suspected to be combination of norovirus diarrhea, ischemic colitis, no prior colonoscopy.  FOBT positive on 1/3, GI pathogen panel positive for norovirus -CTA GI bleed 1/13 showed no active GI bleeding in the imaging  -Patient was offered EGD, colonoscopy, he declined. -Per GI, Dr. Leonides Schanz, capsule endoscopy completed, gastritis and duodenitis with small bowel angioectasia, no active bleeding  -Continue PPI, okay to start AC/antiplatelet agent from GI perspective -Hemoglobin stable   Acute exacerbation of combined systolic and diastolic CHF Moderate to severe aortic stenosis -2D echo 12/23 showed EF 30 -35% with regional WMA, grade 3 DD, mildly reduced RV systolic function, moderate to severe aortic valve stenosis  -GDMT limited by ESRD and BP.  Currently on midodrine  -Metoprolol held due to hypotension.   -Cardiology will follow outpatient.  Continue HD for volume management.    ESRD-HD-MWF -Nephrology following -Continue HD per schedule Patient needs to be able to tolerate dialysis in chair to consider outpatient dialysis.   -Seen in HD, noted again in bed for hemodialysis   Influenza A positive -RVP positive for flu A + on 12/30.  Completed the course of Tamiflu.   Acute metabolic encephalopathy, likely  due to prolonged delirium -Patient met sepsis criteria on admission, POA due to tachycardia, fever, tachypnea, altered mental status -Patient hospital stay was complicated by altered mental status.  There was initial concern for encephalitis vs meningitis. Neurology was consulted.  Patient was placed on broad-spectrum antibiotics -EEG 12/27 and 1/1 did not show any evidence of seizure activity. -MRI brain 1/3 negative for acute abnormality  -LP on 1/3,  CSF labs significant for elevated glucose and protein, not consistent with meningitis. Meningitis/encephalitis panel negative for pathogen. -ID and neurology signed off. -Mental status improving.  Sepsis  physiology has resolved.   Type 2 diabetes mellitus uncontrolled -Hemoglobin A1c > 15.5 on 10/24/2023   CBG (last 3)  Recent Labs    12/08/23 2109 12/09/23 0605 12/09/23 1354  GLUCAP 178* 245* 108*   -Continue sensitive SSI, Semglee 25 units daily, continue  NovoLog 2 units 3 times daily AC   H/o CVA Continue Lipitor   Estimated body mass index is 29.38 kg/m as calculated from the following:   Height as of this encounter: 6\' 1"  (1.854 m).   Weight as of this encounter: 101 kg.  Code Status: Full code DVT Prophylaxis:  heparin injection 5,000 Units Start: 12/04/23 1445 Place and maintain sequential compression device Start: 11/20/23 0706 SCDs Start: 11/09/23 1558   Level of Care: Level of care: Progressive Family Communication:  Disposition Plan:      Remains inpatient appropriate:   Need to be able to tolerate HD in chair for outpatient HD center.   Procedures:    Consultants:   Gastroenterology Cardiology Palliative medicine  Antimicrobials:   Anti-infectives (From admission, onward)    Start     Dose/Rate Route Frequency Ordered Stop   11/18/23 1800  oseltamivir (TAMIFLU) capsule 30 mg        30 mg Oral Every M-W-F (Hemodialysis) 11/18/23 1116 11/23/23 1159   11/17/23 1800  oseltamivir (TAMIFLU) capsule 30 mg  Status:  Discontinued        30 mg Oral Every T-Th-Sa (1800) 11/17/23 0800 11/18/23 1116   11/17/23 1115  vancomycin (VANCOCIN) IVPB 1000 mg/200 mL premix  Status:  Discontinued        1,000 mg 200 mL/hr over 60 Minutes Intravenous Once in dialysis 11/17/23 1021 11/18/23 1331   11/17/23 1000  oseltamivir (TAMIFLU) capsule 30 mg        30 mg Oral  Once 11/17/23 0759 11/17/23 0952   11/16/23 1200  vancomycin (VANCOCIN) IVPB 1000 mg/200 mL premix  Status:  Discontinued        1,000 mg 200 mL/hr over 60 Minutes Intravenous Every M-W-F (Hemodialysis) 11/15/23 1658 11/18/23 1306   11/12/23 2300  vancomycin (VANCOCIN) IVPB 1000 mg/200 mL premix         1,000 mg 200 mL/hr over 60 Minutes Intravenous  Once 11/12/23 2226 11/13/23 0131   11/12/23 2000  cefTRIAXone (ROCEPHIN) 2 g in sodium chloride 0.9 % 100 mL IVPB  Status:  Discontinued        2 g 200 mL/hr over 30 Minutes Intravenous Every 12 hours 11/12/23 1627 11/18/23 1306   11/12/23 2000  ampicillin (OMNIPEN) 2 g in sodium chloride 0.9 % 100 mL IVPB  Status:  Discontinued        2 g 300 mL/hr over 20 Minutes Intravenous Every 12 hours 11/12/23 1627 11/18/23 1306   11/12/23 1730  acyclovir (ZOVIRAX) 450 mg in dextrose 5 % 100 mL IVPB  Status:  Discontinued  5 mg/kg  89.6 kg (Adjusted) 109 mL/hr over 60 Minutes Intravenous Every 24 hours 11/12/23 1627 11/18/23 1306   11/11/23 1915  piperacillin-tazobactam (ZOSYN) IVPB 2.25 g  Status:  Discontinued        2.25 g 100 mL/hr over 30 Minutes Intravenous Every 8 hours 11/11/23 1825 11/12/23 1615   11/11/23 1245  metroNIDAZOLE (FLAGYL) IVPB 500 mg  Status:  Discontinued        500 mg 100 mL/hr over 60 Minutes Intravenous Every 12 hours 11/11/23 1146 11/11/23 1825   11/11/23 1200  vancomycin (VANCOCIN) IVPB 1000 mg/200 mL premix  Status:  Discontinued        1,000 mg 200 mL/hr over 60 Minutes Intravenous Every M-W-F (Hemodialysis) 11/10/23 1458 11/15/23 1658   11/10/23 1545  vancomycin (VANCOCIN) IVPB 1000 mg/200 mL premix       Placed in "Followed by" Linked Group   1,000 mg 200 mL/hr over 60 Minutes Intravenous  Once 11/10/23 1458 11/10/23 1917   11/10/23 1545  vancomycin (VANCOCIN) IVPB 1000 mg/200 mL premix       Placed in "Followed by" Linked Group   1,000 mg 200 mL/hr over 60 Minutes Intravenous  Once 11/10/23 1458 11/10/23 1815   11/10/23 1545  ceFEPIme (MAXIPIME) 1 g in sodium chloride 0.9 % 100 mL IVPB  Status:  Discontinued        1 g 200 mL/hr over 30 Minutes Intravenous Every M-W-F 11/10/23 1458 11/10/23 1520   11/10/23 1545  ceFEPIme (MAXIPIME) 2 g in sodium chloride 0.9 % 100 mL IVPB  Status:  Discontinued        2  g 200 mL/hr over 30 Minutes Intravenous Every M-W-F 11/10/23 1520 11/11/23 1810          Medications  aspirin EC  81 mg Oral Daily   atorvastatin  40 mg Oral Daily   Chlorhexidine Gluconate Cloth  6 each Topical Q0600   cinacalcet  30 mg Oral Q breakfast   darbepoetin (ARANESP) injection - DIALYSIS  100 mcg Subcutaneous Q Mon-1800   doxercalciferol  3 mcg Intravenous Q M,W,F-HD   feeding supplement  237 mL Oral BID BM   heparin injection (subcutaneous)  5,000 Units Subcutaneous Q8H   insulin aspart  0-5 Units Subcutaneous QHS   insulin aspart  0-9 Units Subcutaneous TID WC   insulin aspart  2 Units Subcutaneous TID WC   insulin glargine-yfgn  25 Units Subcutaneous Daily   midodrine  10 mg Oral TID WC   multivitamin  1 tablet Oral QHS   pantoprazole  40 mg Oral BID   sevelamer carbonate  4,000 mg Oral TID WC   sodium zirconium cyclosilicate  10 g Oral Daily   thiamine  100 mg Oral Daily      Subjective:   [name] [name] was seen and examined today.  Patient again receiving dialysis in bed despite the order for HD in chair.  No new complaints feels stable and stronger.    Objective:   Vitals:   12/09/23 1200 12/09/23 1226 12/09/23 1239 12/09/23 1247  BP: 119/62 99/60 99/60  (!) 104/59  Pulse: 73 (!) 18 73 74  Resp: 20 17 (!) 22 20  Temp:  98.7 F (37.1 C)    TempSrc:      SpO2: 99% 100% 99% 99%  Weight:    101 kg  Height:        Intake/Output Summary (Last 24 hours) at 12/09/2023 1449 Last data filed at 12/09/2023 1247 Gross per  24 hour  Intake --  Output 2000 ml  Net -2000 ml     Wt Readings from Last 3 Encounters:  12/09/23 101 kg  10/26/23 110.5 kg  10/22/23 108.9 kg   Physical Exam General: Alert and oriented x 3, NAD Cardiovascular: S1 S2 clear, RRR.  Respiratory: CTAB, no wheezing Gastrointestinal: Soft, nontender, nondistended, NBS Ext: right BKA  Neuro: no new deficits Psych: Normal affect    Data Reviewed:  I have personally reviewed  following labs    CBC Lab Results  Component Value Date   WBC 6.8 12/09/2023   RBC 3.34 (L) 12/09/2023   HGB 9.8 (L) 12/09/2023   HCT 31.2 (L) 12/09/2023   MCV 93.4 12/09/2023   MCH 29.3 12/09/2023   PLT 239 12/09/2023   MCHC 31.4 12/09/2023   RDW 18.3 (H) 12/09/2023   LYMPHSABS 1.8 11/30/2023   MONOABS 1.7 (H) 11/30/2023   EOSABS 0.4 11/30/2023   BASOSABS 0.1 11/30/2023     Last metabolic panel Lab Results  Component Value Date   NA 130 (L) 12/09/2023   K 5.4 (H) 12/09/2023   CL 93 (L) 12/09/2023   CO2 24 12/09/2023   BUN 30 (H) 12/09/2023   CREATININE 9.60 (H) 12/09/2023   GLUCOSE 240 (H) 12/09/2023   GFRNONAA 6 (L) 12/09/2023   GFRAA 3 (L) 08/14/2019   CALCIUM 9.6 12/09/2023   PHOS 8.0 (H) 11/23/2023   PROT 6.9 12/03/2023   ALBUMIN 2.8 (L) 12/03/2023   LABGLOB 5.1 (H) 12/17/2022   AGRATIO 0.7 11/20/2022   BILITOT 0.7 12/03/2023   ALKPHOS 88 12/03/2023   AST 35 12/03/2023   ALT 21 12/03/2023   ANIONGAP 13 12/09/2023    CBG (last 3)  Recent Labs    12/08/23 2109 12/09/23 0605 12/09/23 1354  GLUCAP 178* 245* 108*      Coagulation Profile: No results for input(s): "INR", "PROTIME" in the last 168 hours.   Radiology Studies: I have personally reviewed the imaging studies  No results found.       Thad Ranger M.D. [facility] 12/09/2023, 2:49 PM  Available via Epic secure chat 7am-7pm After 7 pm, please refer to night coverage provider listed on amion.

## 2023-12-09 NOTE — Progress Notes (Signed)
Physical Therapy Treatment Patient Details Name: [name] HOUSER Sr. MRN: [id number] DOB: 08/07/1961 Today's Date: 12/09/2023   History of Present Illness Pt is 62 year old presented [facility] on  [facility]/08/23 after experiencing a near syncope during HD. Pt being worked up for an NSTEMI pt unable to tolerate medication and no longer plan to perform cardiac cath, and fever with acute metabolic encephalopathy.  Pt recently admitted 12/6-12/10/24 for a fall. PMH - htn, cardiomyopathy, CHF, dm, esrd on hd, R BKA, CVA    PT Comments  Pt was given shrinker by [employer]. Pt continues to have pain in L hand which limits grip and impacts functional mobility. Pt reluctant to engage in therapy, reporting "he was nervous to walk" d/t fall PTA when not using an AD. Provided pt comfort and educated him that he was working with 2 skilled therapist and would be supported with a gait belt and RW. Unable to advance OOB mobility due to pt self-limiting and fatigue following HD. Pt performed STS x2 and engaged in static standing as well as stepping fwd/bkwd with modA of 2 people and use of RW. Pt will continue to benefit from acute PT services to advance functional mobility and safety prior to d/c.    If plan is discharge home, recommend the following: Two people to help with walking and/or transfers;Assistance with cooking/housework;Assist for transportation;Help with stairs or ramp for entrance;Supervision due to cognitive status   Can travel by private vehicle        Equipment Recommendations       Recommendations for Other Services       Precautions / Restrictions Precautions Precautions: Fall Precaution Comments: DROPLET - flu and Norovirus precatuions Required Braces or Orthoses: Other Brace Other Brace: R BKA prosthesis Restrictions Weight Bearing Restrictions Per Provider Order: No Other Position/Activity Restrictions: R BKA     Mobility  Bed Mobility Overal bed mobility: Needs Assistance Bed Mobility:  Supine to Sit, Sit to Supine     Supine to sit: HOB elevated, Used rails, Min assist Sit to supine: Used rails, Contact guard assist   General bed mobility comments: Pt continues to require cueing for sequencing bed mobility. Pt able to bring BLE off EOB and use RUE on handrail, minA required at trunk to faciliate upright posture. Pt returned himself to supine.    Transfers Overall transfer level: Needs assistance Equipment used: Rolling walker (2 wheels) Transfers: Sit to/from Stand Sit to Stand: +2 physical assistance, Mod assist, +2 safety/equipment           General transfer comment: STS x2 reps from EOB. Pt kept RUE on RW and pushed up from bed with LUE in a fist position. Cued pt to reach back to the bed prior to sitting down.    Ambulation/Gait Ambulation/Gait assistance: Mod assist, +2 physical assistance, +2 safety/equipment Gait Distance (Feet): 1 Feet Assistive device: Standard walker         General Gait Details: Pt stepped fwd/bkwd twice on each leg. Refused further ambulation due to nerves.   Stairs             Wheelchair Mobility     Tilt Bed    Modified Rankin (Stroke Patients Only)       Balance Overall balance assessment: Needs assistance Sitting-balance support: Feet supported, Single extremity supported Sitting balance-Leahy Scale: Fair Sitting balance - Comments: Pt sat EOB for a total of ~63mins with Sup.   Standing balance support: Bilateral upper extremity supported, Reliant on assistive device  for balance, During functional activity Standing balance-Leahy Scale: Poor Standing balance comment: Pt stood using RW for a total of ~4mins across 2 different bouts, one of static standing and the other stepping fwd/bkwd.                            Cognition Arousal: Alert Behavior During Therapy: Flat affect, Anxious Overall Cognitive Status: Impaired/Different from baseline Area of Impairment: Following commands,  Safety/judgement, Awareness                       Following Commands: Follows one step commands inconsistently Safety/Judgement: Decreased awareness of safety     General Comments: Pt reported being nervous about moving today and was reluctant to engage in the therapy session initially. Pt limited activities to STS and stepping fwd/bkwd.        Exercises Amputee Exercises Quad Sets: AROM, Right, 10 reps    General Comments General comments (skin integrity, edema, etc.): Pt anxious today reporting he fell PTA when ambulating without an AD and is nervous to engage in OOB mobility.      Pertinent Vitals/Pain Pain Assessment Pain Assessment: Faces Faces Pain Scale: Hurts little more Pain Location: L hand Pain Descriptors / Indicators: Guarding, Grimacing, Discomfort Pain Intervention(s): Monitored during session    Home Living                          Prior Function            PT Goals (current goals can now be found in the care plan section) Acute Rehab PT Goals Patient Stated Goal: Get to the chair tomorrow Progress towards PT goals: Progressing toward goals    Frequency    Min 1X/week      PT Plan      Co-evaluation PT/OT/SLP Co-Evaluation/Treatment: Yes Reason for Co-Treatment: Necessary to address cognition/behavior during functional activity;For patient/therapist safety;To address functional/ADL transfers PT goals addressed during session: Mobility/safety with mobility;Balance OT goals addressed during session: ADL's and self-care;Strengthening/ROM      AM-PAC PT "6 Clicks" Mobility   Outcome Measure  Help needed turning from your back to your side while in a flat bed without using bedrails?: A Little Help needed moving from lying on your back to sitting on the side of a flat bed without using bedrails?: Total Help needed moving to and from a bed to a chair (including a wheelchair)?: Total Help needed standing up from a chair using  your arms (e.g., wheelchair or bedside chair)?: Total Help needed to walk in hospital room?: Total Help needed climbing 3-5 steps with a railing? : Total 6 Click Score: 8    End of Session Equipment Utilized During Treatment: Gait belt Activity Tolerance: Patient limited by fatigue (secondary to HD) Patient left: in bed;with bed alarm set;with call bell/phone within reach Nurse Communication: Mobility status PT Visit Diagnosis: Muscle weakness (generalized) (M62.81);Difficulty in walking, not elsewhere classified (R26.2);Pain     Time: 1610-9604 PT Time Calculation (min) (ACUTE ONLY): 26 min  Charges:    $Therapeutic Activity: 8-22 mins                       Cheri Guppy, PT, DPT Acute Rehabilitation Services Office: [phone] Secure Chat Preferred    [name] [name] 12/09/2023, 4:23 PM

## 2023-12-09 NOTE — Plan of Care (Signed)
  Problem: Education: Goal: Knowledge of General Education information will improve Description: Including pain rating scale, medication(s)/side effects and non-pharmacologic comfort measures Outcome: Progressing   Problem: Health Behavior/Discharge Planning: Goal: Ability to manage health-related needs will improve Outcome: Progressing   Problem: Clinical Measurements: Goal: Ability to maintain clinical measurements within normal limits will improve Outcome: Progressing Goal: Will remain free from infection Outcome: Progressing Goal: Diagnostic test results will improve Outcome: Progressing Goal: Respiratory complications will improve Outcome: Progressing Goal: Cardiovascular complication will be avoided Outcome: Progressing   Problem: Activity: Goal: Risk for activity intolerance will decrease Outcome: Progressing   Problem: Nutrition: Goal: Adequate nutrition will be maintained Outcome: Progressing   Problem: Coping: Goal: Level of anxiety will decrease Outcome: Progressing   Problem: Elimination: Goal: Will not experience complications related to bowel motility Outcome: Progressing Goal: Will not experience complications related to urinary retention Outcome: Progressing   Problem: Pain Management: Goal: General experience of comfort will improve Outcome: Progressing   Problem: Safety: Goal: Ability to remain free from injury will improve Outcome: Progressing   Problem: Skin Integrity: Goal: Risk for impaired skin integrity will decrease Outcome: Progressing   Problem: Education: Goal: Ability to describe self-care measures that may prevent or decrease complications (Diabetes Survival Skills Education) will improve Outcome: Progressing Goal: Individualized Educational Video(s) Outcome: Progressing   Problem: Coping: Goal: Ability to adjust to condition or change in health will improve Outcome: Progressing   Problem: Fluid Volume: Goal: Ability to  maintain a balanced intake and output will improve Outcome: Progressing   Problem: Health Behavior/Discharge Planning: Goal: Ability to identify and utilize available resources and services will improve Outcome: Progressing Goal: Ability to manage health-related needs will improve Outcome: Progressing   Problem: Metabolic: Goal: Ability to maintain appropriate glucose levels will improve Outcome: Progressing   Problem: Nutritional: Goal: Maintenance of adequate nutrition will improve Outcome: Progressing Goal: Progress toward achieving an optimal weight will improve Outcome: Progressing   Problem: Skin Integrity: Goal: Risk for impaired skin integrity will decrease Outcome: Progressing   Problem: Tissue Perfusion: Goal: Adequacy of tissue perfusion will improve Outcome: Progressing   Problem: Safety: Goal: Non-violent Restraint(s) Outcome: Progressing   Problem: Education: Goal: Ability to identify signs and symptoms of gastrointestinal bleeding will improve Outcome: Progressing   Problem: Bowel/Gastric: Goal: Will show no signs and symptoms of gastrointestinal bleeding Outcome: Progressing   Problem: Fluid Volume: Goal: Will show no signs and symptoms of excessive bleeding Outcome: Progressing   Problem: Clinical Measurements: Goal: Complications related to the disease process, condition or treatment will be avoided or minimized Outcome: Progressing

## 2023-12-09 NOTE — Procedures (Signed)
Notified nurse during report that patient needs to come up in chair to dialyze. Nurse verbalized understanding.

## 2023-12-09 NOTE — Procedures (Signed)
Received patient in bed to unit.  Alert and oriented.  Informed consent signed and in chart.   TX duration: 3.5 hours  Patient tolerated well.  Transported back to the room  Alert, without acute distress.  Hand-off given to patient's nurse.   Access used: lavf Access issues: none  Total UF removed: 2000 ml Medication(s) given: hecterol   Lu Duffel, RN [facility] D[facility] Unit

## 2023-12-09 NOTE — Progress Notes (Signed)
Occupational Therapy Treatment Patient Details Name: [name] [name] Sr. MRN: [id number] DOB: [birth year] Today's Date: 12/09/2023   History of present illness Pt is 63 year old presented to [facility] on  11/08/23 after experiencing a near syncope during HD. Pt being worked up for an NSTEMI pt unable to tolerate medication and no longer plan to perform cardiac cath, and fever with acute metabolic encephalopathy.  Pt recently admitted 12/6-12/10/24 for a fall. PMH - htn, cardiomyopathy, CHF, dm, esrd on hd, R BKA, CVA   OT comments  Pt received in bed, agreeable to OT/PT session. Pt limited this date secondary to fatigue and fear of falling. Pt tolerated standing x2 bouts ~49min total and took 2 steps forward/backward. Pt agreeable to further mobility progression at next session. He required modA for LB dressing to don/doff R prosthetic and limb guard and don/doff L shoe due to decreased functional use of left hand secondary to pain. Continue to recommend d/c plan and frequency remains appropriate. Will continue to follow acutely and progress appropriately.       If plan is discharge home, recommend the following:  Two people to help with walking and/or transfers;Assistance with cooking/housework;Supervision due to cognitive status;A lot of help with bathing/dressing/bathroom   Equipment Recommendations  None recommended by OT    Recommendations for Other Services      Precautions / Restrictions Precautions Precautions: Fall Precaution Comments: DROPLET - flu and Norovirus precatuions Required Braces or Orthoses: Other Brace Other Brace: R BKA prosthesis Restrictions Weight Bearing Restrictions Per Provider Order: No Other Position/Activity Restrictions: R BKA       Mobility Bed Mobility Overal bed mobility: Needs Assistance Bed Mobility: Supine to Sit, Sit to Supine     Supine to sit: HOB elevated, Used rails, Min assist Sit to supine: Used rails, Contact guard assist   General bed  mobility comments: Pt continues to require cueing for sequencing bed mobility. Pt able to bring BLE off EOB and use RUE on handrail, minA required at trunk to faciliate upright posture. Pt returned himself to supine.    Transfers Overall transfer level: Needs assistance Equipment used: Rolling walker (2 wheels) Transfers: Sit to/from Stand Sit to Stand: +2 physical assistance, Mod assist, +2 safety/equipment           General transfer comment: STS x2 reps from EOB. Pt kept RUE on RW and pushed up from bed with LUE in a fist position. cues for safe hand placement     Balance Overall balance assessment: Needs assistance Sitting-balance support: Feet supported, Single extremity supported Sitting balance-Leahy Scale: Fair Sitting balance - Comments: Pt sat EOB for a total of ~79mins with Supervision for safety   Standing balance support: Bilateral upper extremity supported, Reliant on assistive device for balance, During functional activity Standing balance-Leahy Scale: Poor Standing balance comment: Pt stood using RW for a total of ~78mins across 2 different bouts, one of static standing and the other stepping fwd/bkwd.                           ADL either performed or assessed with clinical judgement   ADL Overall ADL's : Needs assistance/impaired     Grooming: Set up;Sitting               Lower Body Dressing: Moderate assistance;Sitting/lateral leans Lower Body Dressing Details (indicate cue type and reason): modA for donning/doffing RLE prosthetic and limb shrinker and LLE shoe. pt reports his brother assists  at baseline               General ADL Comments: pt self limiting to 2 steps forward/backward from EOB this date. endorses fear of falling, communicated fall prevention strategies (gait belt, use of RW, +2 assistance and short distance with bed behind pt) to reassure pt and maximize confidence with safe mobility    Extremity/Trunk Assessment Upper  Extremity Assessment Upper Extremity Assessment: Generalized weakness;RUE deficits/detail;LUE deficits/detail RUE Deficits / Details: ROM WFL, decreased sensation throughout RUE LUE Deficits / Details: limited functional use due to pain, limited assessment secondary to pain and hypersensisitivity, pt wincing at the touch. pt unable to make a fist   Lower Extremity Assessment Lower Extremity Assessment: Defer to PT evaluation        Vision   Vision Assessment?: No apparent visual deficits   Perception     Praxis      Cognition Arousal: Alert Behavior During Therapy: Flat affect, Anxious Overall Cognitive Status: Impaired/Different from baseline Area of Impairment: Following commands, Safety/judgement, Awareness                       Following Commands: Follows one step commands inconsistently Safety/Judgement: Decreased awareness of safety     General Comments: Pt reported being nervous about moving today and was reluctant to engage in the therapy session initially. Pt limited activities to STS and stepping fwd/bkwd.        Exercises      Shoulder Instructions       General Comments Pt anxious today reporting he fell PTA when ambulating without an AD and is nervous to engage in OOB mobility.    Pertinent Vitals/ Pain       Pain Assessment Pain Assessment: Faces Faces Pain Scale: Hurts little more Pain Location: L hand Pain Descriptors / Indicators: Guarding, Grimacing, Discomfort Pain Intervention(s): Monitored during session  Home Living                                          Prior Functioning/Environment              Frequency  Min 1X/week        Progress Toward Goals  OT Goals(current goals can now be found in the care plan section)  Progress towards OT goals: Progressing toward goals  Acute Rehab OT Goals Patient Stated Goal: to get to the chair tomorrow OT Goal Formulation: With patient Time For Goal  Achievement: 12/23/23 Potential to Achieve Goals: Good ADL Goals Pt Will Perform Eating: with modified independence;with adaptive utensils Pt Will Perform Grooming: with modified independence;with adaptive equipment Pt Will Perform Upper Body Bathing: with contact guard assist;with supervision;sitting Pt Will Perform Lower Body Bathing: with min assist;with contact guard assist;sitting/lateral leans Pt Will Perform Upper Body Dressing: with supervision;sitting Pt Will Perform Lower Body Dressing: with contact guard assist;sit to/from stand Pt Will Transfer to Toilet: with contact guard assist;ambulating Additional ADL Goal #1:  (met 1/17) Additional ADL Goal #2: Pt will complete desensitization exercises to tolerate 50% of activites with min A for funtional tasks  Plan      Co-evaluation    PT/OT/SLP Co-Evaluation/Treatment: Yes Reason for Co-Treatment: Necessary to address cognition/behavior during functional activity;For patient/therapist safety;To address functional/ADL transfers PT goals addressed during session: Mobility/safety with mobility;Balance OT goals addressed during session: ADL's and self-care;Strengthening/ROM      AM-PAC  OT "6 Clicks" Daily Activity     Outcome Measure   Help from another person eating meals?: None Help from another person taking care of personal grooming?: A Little Help from another person toileting, which includes using toliet, bedpan, or urinal?: A Lot Help from another person bathing (including washing, rinsing, drying)?: A Lot Help from another person to put on and taking off regular upper body clothing?: A Little Help from another person to put on and taking off regular lower body clothing?: A Lot 6 Click Score: 16    End of Session Equipment Utilized During Treatment: Gait belt;Rolling walker (2 wheels);Other (comment) (rle prosthetic)  OT Visit Diagnosis: Other abnormalities of gait and mobility (R26.89);Muscle weakness (generalized)  (M62.81);Pain Pain - Right/Left: Left Pain - part of body: Arm;Hand   Activity Tolerance Patient limited by fatigue   Patient Left in bed;with call bell/phone within reach;with bed alarm set   Nurse Communication Mobility status        Time: 1610-9604 OT Time Calculation (min): 27 min  Charges: OT General Charges $OT Visit: 1 Visit OT Treatments $Self Care/Home Management : 8-22 mins  Rosey Bath OTR/L Acute Rehabilitation Services Office: [phone]   Providence Crosby 12/09/2023, 4:32 PM

## 2023-12-09 NOTE — Procedures (Signed)
Patient arrived to unit but in bed. Patient supposed to be in chair. See previous note. Asked patient if he refused to get in chair and patient stated "no, I wanted to do my dialysis in a chair, [employer]'m [employer] nurse notified.

## 2023-12-10 DIAGNOSIS — I214 Non-ST elevation (NSTEMI) myocardial infarction: Secondary | ICD-10-CM | POA: Diagnosis not present

## 2023-12-10 DIAGNOSIS — I9589 Other hypotension: Secondary | ICD-10-CM | POA: Diagnosis not present

## 2023-12-10 DIAGNOSIS — N186 End stage renal disease: Secondary | ICD-10-CM | POA: Diagnosis not present

## 2023-12-10 DIAGNOSIS — G9341 Metabolic encephalopathy: Secondary | ICD-10-CM | POA: Diagnosis not present

## 2023-12-10 LAB — BASIC METABOLIC PANEL
Anion gap: 11 (ref 5–15)
BUN: 18 mg/dL (ref 8–23)
CO2: 27 mmol/L (ref 22–32)
Calcium: 9.5 mg/dL (ref 8.9–10.3)
Chloride: 96 mmol/L — ABNORMAL LOW (ref 98–111)
Creatinine, Ser: 7.02 mg/dL — ABNORMAL HIGH (ref 0.61–1.24)
GFR, Estimated: 8 mL/min — ABNORMAL LOW (ref >60)
Glucose, Bld: 147 mg/dL — ABNORMAL HIGH (ref 70–99)
Potassium: 4.5 mmol/L (ref 3.5–5.1)
Sodium: 134 mmol/L — ABNORMAL LOW (ref 135–145)

## 2023-12-10 LAB — CBC
HCT: 32.2 % — ABNORMAL LOW (ref 39.0–52.0)
Hemoglobin: 9.9 g/dL — ABNORMAL LOW (ref 13.0–17.0)
MCH: 29 pg (ref 26.0–34.0)
MCHC: 30.7 g/dL (ref 30.0–36.0)
MCV: 94.4 fL (ref 80.0–100.0)
Platelets: 240 10*3/uL (ref 150–400)
RBC: 3.41 MIL/uL — ABNORMAL LOW (ref 4.22–5.81)
RDW: 18.7 % — ABNORMAL HIGH (ref 11.5–15.5)
WBC: 6.3 10*3/uL (ref 4.0–10.5)
nRBC: 0 % (ref 0.0–0.2)

## 2023-12-10 LAB — GLUCOSE, CAPILLARY
Glucose-Capillary: 98 mg/dL (ref 70–99)
Glucose-Capillary: 94 mg/dL (ref 70–99)
Glucose-Capillary: 142 mg/dL — ABNORMAL HIGH (ref 70–99)
Glucose-Capillary: 138 mg/dL — ABNORMAL HIGH (ref 70–99)

## 2023-12-10 NOTE — Progress Notes (Signed)
Physical Therapy Treatment Patient Details Name: [name] [name]. MRN: [id number] DOB: Sep 28, 1961 Today's Date: 12/10/2023   History of Present Illness Pt is 63 year old presented to [facility] on  11/08/23 after experiencing a near syncope during HD. Pt being worked up for an NSTEMI pt unable to tolerate medication and no longer plan to perform cardiac cath, and fever with acute metabolic encephalopathy.  Pt recently admitted 12/6-12/10/24 for a fall. PMH - htn, cardiomyopathy, CHF, dm, esrd on hd, R BKA, CVA    PT Comments  Pt pleasant and agreeable to PT treatment. Pt required modA x2 for STS from a lower level surface using RW. Pt completed three bouts of gait each followed by a seated rest break. Pt ambulated around the bed to recliner chair and forward/backward in front of the chair using RW and minA x2 for safety. PT demonstrated appropriate sequencing and positioning using RW, to which pt verbalized understanding and demonstrated improved performance on following attempts. Will continue to follow acutely and progress appropriately.     If plan is discharge home, recommend the following: Two people to help with walking and/or transfers;Assistance with cooking/housework;Assist for transportation;Help with stairs or ramp for entrance;Supervision due to cognitive status   Can travel by private vehicle        Equipment Recommendations       Recommendations for Other Services       Precautions / Restrictions Precautions Precautions: Fall Precaution Comments: DROPLET - flu and Norovirus precatuions Required Braces or Orthoses: Other Brace Other Brace: R BKA prosthesis Restrictions Weight Bearing Restrictions Per Provider Order: No     Mobility  Bed Mobility Overal bed mobility: Needs Assistance Bed Mobility: Supine to Sit Rolling: Used rails, Min assist         General bed mobility comments: Pt requires minA at trunk to reach upright seated posture. Pt is able to scoot fwd toward  EOB with CGA.    Transfers Overall transfer level: Needs assistance Equipment used: Rolling walker (2 wheels) Transfers: Sit to/from Stand Sit to Stand: +2 physical assistance, Mod assist, +2 safety/equipment           General transfer comment: STS x3 reps, one from EOB and two from recliner chair. Cued pt to scoot closer to the edge and increased fwd lean to assist in powering up. Pt maintained RUE on RW and pushed from bed with LUE. ModA x2 required to assist pt to gain standing. Cued pt to reach back for the surface he was going to. Pt lacks eccentric controlling, plopping down.    Ambulation/Gait Ambulation/Gait assistance: Min assist, +2 physical assistance, +2 safety/equipment Gait Distance (Feet): 10 Feet (1x102ft around bed to recliner chair; 1x32ft from chair to wall; 1x41ft fwd/bkwd stepping. Each bout was followed by a seated rest break.) Assistive device: Rolling walker (2 wheels) Gait Pattern/deviations: Step-through pattern, Decreased stride length, Trunk flexed   Gait velocity interpretation: <1.31 ft/sec, indicative of household ambulator   General Gait Details: Pt demonstrated short steps that passed the opposite LE and good weight shifting. Pt navigated the RW in the room with intermittent minA on L d/t decreased L grip strength. VC/TC throughout to increase pt's safety awareness. Pt demonstrated improved sequencing with repetition.   Stairs             Wheelchair Mobility     Tilt Bed    Modified Rankin (Stroke Patients Only)       Balance Overall balance assessment: Needs assistance Sitting-balance support: Feet  supported, Single extremity supported Sitting balance-Leahy Scale: Fair Sitting balance - Comments: Pt sat EOB while R prosthesis and L shoe were donned by PT. Posterior and L lateral lean, required minA to regain upright posture, and relied on RUE on handrail to maintain seated balance. Postural control: Posterior lean, Left lateral  lean Standing balance support: Bilateral upper extremity supported, Reliant on assistive device for balance, During functional activity Standing balance-Leahy Scale: Poor Standing balance comment: Pt relies on RW for stability during all OOB mobility.                            Cognition Arousal: Alert Behavior During Therapy: WFL for tasks assessed/performed Overall Cognitive Status: Impaired/Different from baseline Area of Impairment: Following commands, Safety/judgement, Awareness                 Orientation Level: Person, Place, Time, Situation     Following Commands: Follows one step commands inconsistently Safety/Judgement: Decreased awareness of safety   Problem Solving: Requires verbal cues, Requires tactile cues General Comments: Pt demonstrates poor safety awarness, trying to mobilize quickly with RW too far in front of him. VC/TC to correct. Demonstrated proper AD sequencing while pt was seated in a chair, he verbalized understanding and demonstrated improved mechanics in following bouts of mobility.        Exercises      General Comments General comments (skin integrity, edema, etc.): VSS on RA      Pertinent Vitals/Pain Pain Assessment Pain Assessment: No/denies pain    Home Living                          Prior Function            PT Goals (current goals can now be found in the care plan section) Acute Rehab PT Goals Patient Stated Goal: Sit up for 4 hours to do dialysis in the chair tomorrow Progress towards PT goals: Progressing toward goals    Frequency    Min 1X/week      PT Plan      Co-evaluation              AM-PAC PT "6 Clicks" Mobility   Outcome Measure  Help needed turning from your back to your side while in a flat bed without using bedrails?: A Little Help needed moving from lying on your back to sitting on the side of a flat bed without using bedrails?: A Lot Help needed moving to and from a  bed to a chair (including a wheelchair)?: Total Help needed standing up from a chair using your arms (e.g., wheelchair or bedside chair)?: Total Help needed to walk in hospital room?: Total Help needed climbing 3-5 steps with a railing? : Total 6 Click Score: 9    End of Session Equipment Utilized During Treatment: Gait belt Activity Tolerance: Patient tolerated treatment well Patient left: in chair;with chair alarm set;with call bell/phone within reach Nurse Communication: Mobility status;Other (comment) (Updated RN on pt's performance in today's session and need to sit up for 4 hours in the recliner chair to demonstrate ability to tolerate HD in chair.) PT Visit Diagnosis: Muscle weakness (generalized) (M62.81);Difficulty in walking, not elsewhere classified (R26.2);Pain     Time: 1610-9604 PT Time Calculation (min) (ACUTE ONLY): 24 min  Charges:    $Gait Training: 23-37 mins  Cheri Guppy, PT, DPT Acute Rehabilitation Services Office: [phone] Secure Chat Preferred   Richardson Chiquito 12/10/2023, 11:40 AM

## 2023-12-10 NOTE — Progress Notes (Signed)
[facility]                                                                              [name] [name], is a 63 y.o. male, DOB - 03-11-1961, MVH:[id number] Admit date - 11/08/2023    Outpatient Primary MD for the patient is Pcp, No  LOS - 32  days  Chief Complaint  Patient presents with   Hypotension   Cough   Shortness of Breath       Brief summary   Patient is a 63 y.o. male with PMH significant for ESRD-HD-MWF, DM2, HTN, nonischemic cardiomyopathy, mild systolic CHF, prior right BKA.  Patient presented to ED on 12/22 after experiencing an episode of chest pain, hypotension and near syncope during hemodialysis.  His initial blood pressure was low in 75/59 Troponin was significantly elevated and peaked at 9000, he was diagnosed with NSTEMI, started on heparin drip.  Patient was admitted for further workup.   Hospital course was also complicated altered mental status, influenza, GI bleeding, norovirus diarrhea Patient is currently weak and unable to be in a chair for dialysis. Palliative care consulted.   Assessment & Plan   NSTEMI -Presented with chest pain, hypertension, syncope, significantly elevated troponin  -2D echo 11/09/2023 showed EF 30 -35% with regional WMA, moderate to severe aortic stenosis -Initially started on medical management with IV heparin drip, aspirin and Lipitor -Initial consideration of cardiac cath, was placed on trial of DAPT.  However after single dose of Plavix with aspirin, patient had profuse rectal bleeding, hence DAPT was held.  Per cardiology, no plan for inpatient ischemic evaluation.  This can be revisited as outpatient when GI issues stabilized and cleared to utilize DAPT going forward. -Cleared by GI to place on antiplatelet agent,  -Per cardiology, started on aspirin 81 mg daily, outpatient cardiac evaluation planned.  Continue statin  Acute GI bleeding  Norovirus diarrhea -Patient had several days of GI bleeding, worsened  with DAPT - Bleeding suspected to be combination of norovirus diarrhea, ischemic colitis, no prior colonoscopy.  FOBT positive on 1/3, GI pathogen panel positive for norovirus -CTA GI bleed 1/13 showed no active GI bleeding in the imaging  -Patient was offered EGD, colonoscopy, he declined. -Per GI, Dr. Leonides Schanz, capsule endoscopy completed, gastritis and duodenitis with small bowel angioectasia, no active bleeding  -Continue PPI, okay to start AC/antiplatelet agent from GI perspective-> started on ASA 81 mg daily -H&H stable   Acute exacerbation of combined systolic and diastolic CHF Moderate to severe aortic stenosis -2D echo 12/23 showed EF 30 -35% with regional WMA, grade 3 DD, mildly reduced RV systolic function, moderate to severe aortic valve stenosis  -GDMT limited by ESRD and BP.  Currently on midodrine  -Metoprolol held due to hypotension.   -HD for volume management    ESRD-HD-MWF -Nephrology following -Continue HD per schedule Patient needs to be able to tolerate dialysis in chair to consider outpatient dialysis.   -Plan for HD in chair    Influenza A positive -RVP positive for flu A + on 12/30.  Completed the course of Tamiflu.   Acute metabolic encephalopathy, likely due to prolonged  delirium -Patient met sepsis criteria on admission, POA due to tachycardia, fever, tachypnea, altered mental status -Patient hospital stay was complicated by altered mental status.  There was initial concern for encephalitis vs meningitis. Neurology was consulted.  Patient was placed on broad-spectrum antibiotics -EEG 12/27 and 1/1 did not show any evidence of seizure activity. -MRI brain 1/3 negative for acute abnormality  -LP on 1/3,  CSF labs significant for elevated glucose and protein, not consistent with meningitis. Meningitis/encephalitis panel negative for pathogen. -ID and neurology signed off. -Mental status improving.  Sepsis physiology has resolved.   Type 2 diabetes mellitus  uncontrolled -Hemoglobin A1c > 15.5 on 10/24/2023   CBG (last 3)  Recent Labs    12/09/23 2045 12/10/23 0624 12/10/23 1131  GLUCAP 142* 98 94   -Continue sensitive SSI, Semglee 25 units daily, continue  NovoLog 2 units 3 times daily AC   H/o CVA Continue Lipitor   Estimated body mass index is 29.49 kg/m as calculated from the following:   Height as of this encounter: 6\' 1"  (1.854 m).   Weight as of this encounter: 101.4 kg.  Code Status: Full code DVT Prophylaxis:  heparin injection 5,000 Units Start: 12/04/23 1445 Place and maintain sequential compression device Start: 11/20/23 0706 SCDs Start: 11/09/23 1558   Level of Care: Level of care: Progressive Family Communication:  Disposition Plan:      Remains inpatient appropriate:   Need to be able to tolerate HD in chair for outpatient HD center.   Procedures:    Consultants:   Gastroenterology Cardiology Palliative medicine  Antimicrobials:   Anti-infectives (From admission, onward)    Start     Dose/Rate Route Frequency Ordered Stop   11/18/23 1800  oseltamivir (TAMIFLU) capsule 30 mg        30 mg Oral Every M-W-F (Hemodialysis) 11/18/23 1116 11/23/23 1159   11/17/23 1800  oseltamivir (TAMIFLU) capsule 30 mg  Status:  Discontinued        30 mg Oral Every T-Th-Sa (1800) 11/17/23 0800 11/18/23 1116   11/17/23 1115  vancomycin (VANCOCIN) IVPB 1000 mg/200 mL premix  Status:  Discontinued        1,000 mg 200 mL/hr over 60 Minutes Intravenous Once in dialysis 11/17/23 1021 11/18/23 1331   11/17/23 1000  oseltamivir (TAMIFLU) capsule 30 mg        30 mg Oral  Once 11/17/23 0759 11/17/23 0952   11/16/23 1200  vancomycin (VANCOCIN) IVPB 1000 mg/200 mL premix  Status:  Discontinued        1,000 mg 200 mL/hr over 60 Minutes Intravenous Every M-W-F (Hemodialysis) 11/15/23 1658 11/18/23 1306   11/12/23 2300  vancomycin (VANCOCIN) IVPB 1000 mg/200 mL premix        1,000 mg 200 mL/hr over 60 Minutes Intravenous  Once  11/12/23 2226 11/13/23 0131   11/12/23 2000  cefTRIAXone (ROCEPHIN) 2 g in sodium chloride 0.9 % 100 mL IVPB  Status:  Discontinued        2 g 200 mL/hr over 30 Minutes Intravenous Every 12 hours 11/12/23 1627 11/18/23 1306   11/12/23 2000  ampicillin (OMNIPEN) 2 g in sodium chloride 0.9 % 100 mL IVPB  Status:  Discontinued        2 g 300 mL/hr over 20 Minutes Intravenous Every 12 hours 11/12/23 1627 11/18/23 1306   11/12/23 1730  acyclovir (ZOVIRAX) 450 mg in dextrose 5 % 100 mL IVPB  Status:  Discontinued        5 mg/kg  89.6 kg (Adjusted) 109 mL/hr over 60 Minutes Intravenous Every 24 hours 11/12/23 1627 11/18/23 1306   11/11/23 1915  piperacillin-tazobactam (ZOSYN) IVPB 2.25 g  Status:  Discontinued        2.25 g 100 mL/hr over 30 Minutes Intravenous Every 8 hours 11/11/23 1825 11/12/23 1615   11/11/23 1245  metroNIDAZOLE (FLAGYL) IVPB 500 mg  Status:  Discontinued        500 mg 100 mL/hr over 60 Minutes Intravenous Every 12 hours 11/11/23 1146 11/11/23 1825   11/11/23 1200  vancomycin (VANCOCIN) IVPB 1000 mg/200 mL premix  Status:  Discontinued        1,000 mg 200 mL/hr over 60 Minutes Intravenous Every M-W-F (Hemodialysis) 11/10/23 1458 11/15/23 1658   11/10/23 1545  vancomycin (VANCOCIN) IVPB 1000 mg/200 mL premix       Placed in "Followed by" Linked Group   1,000 mg 200 mL/hr over 60 Minutes Intravenous  Once 11/10/23 1458 11/10/23 1917   11/10/23 1545  vancomycin (VANCOCIN) IVPB 1000 mg/200 mL premix       Placed in "Followed by" Linked Group   1,000 mg 200 mL/hr over 60 Minutes Intravenous  Once 11/10/23 1458 11/10/23 1815   11/10/23 1545  ceFEPIme (MAXIPIME) 1 g in sodium chloride 0.9 % 100 mL IVPB  Status:  Discontinued        1 g 200 mL/hr over 30 Minutes Intravenous Every M-W-F 11/10/23 1458 11/10/23 1520   11/10/23 1545  ceFEPIme (MAXIPIME) 2 g in sodium chloride 0.9 % 100 mL IVPB  Status:  Discontinued        2 g 200 mL/hr over 30 Minutes Intravenous Every M-W-F  11/10/23 1520 11/11/23 1810          Medications  aspirin EC  81 mg Oral Daily   atorvastatin  40 mg Oral Daily   Chlorhexidine Gluconate Cloth  6 each Topical Q0600   cinacalcet  30 mg Oral Q breakfast   darbepoetin (ARANESP) injection - DIALYSIS  100 mcg Subcutaneous Q Mon-1800   doxercalciferol  3 mcg Intravenous Q M,W,F-HD   feeding supplement  237 mL Oral BID BM   heparin injection (subcutaneous)  5,000 Units Subcutaneous Q8H   insulin aspart  0-5 Units Subcutaneous QHS   insulin aspart  0-9 Units Subcutaneous TID WC   insulin aspart  2 Units Subcutaneous TID WC   insulin glargine-yfgn  25 Units Subcutaneous Daily   midodrine  10 mg Oral TID WC   multivitamin  1 tablet Oral QHS   pantoprazole  40 mg Oral BID   sevelamer carbonate  4,000 mg Oral TID WC   sodium zirconium cyclosilicate  10 g Oral Daily   thiamine  100 mg Oral Daily      Subjective:   [name] [name] was seen and examined today.  No acute complaints per the patient, he feels better and wants to try HD in chair.  No new complaints, no nausea vomiting, abdominal pain, chest pain.   Objective:   Vitals:   12/10/23 0431 12/10/23 0801 12/10/23 0900 12/10/23 1129  BP: (!) 116/52 116/60  126/82  Pulse: 76 81  76  Resp: 18 20 12 20   Temp: 97.9 F (36.6 C) 98 F (36.7 C)  98 F (36.7 C)  TempSrc: Oral Oral  Oral  SpO2: 98% 95%  100%  Weight: 101.4 kg     Height:        Intake/Output Summary (Last 24 hours) at 12/10/2023 1201 Last data  filed at 12/10/2023 0200 Gross per 24 hour  Intake 240 ml  Output 2000 ml  Net -1760 ml     Wt Readings from Last 3 Encounters:  12/10/23 101.4 kg  10/26/23 110.5 kg  10/22/23 108.9 kg    Physical Exam General: Alert and oriented x 3, NAD Cardiovascular: S1 S2 clear, RRR.  Respiratory: CTAB, no wheezing, rales  Gastrointestinal: Soft, nontender, nondistended, NBS Ext: R BKA Neuro: no new deficits Psych: Normal affect    Data Reviewed:  I have personally  reviewed following labs    CBC Lab Results  Component Value Date   WBC 6.3 12/10/2023   RBC 3.41 (L) 12/10/2023   HGB 9.9 (L) 12/10/2023   HCT 32.2 (L) 12/10/2023   MCV 94.4 12/10/2023   MCH 29.0 12/10/2023   PLT 240 12/10/2023   MCHC 30.7 12/10/2023   RDW 18.7 (H) 12/10/2023   LYMPHSABS 1.8 11/30/2023   MONOABS 1.7 (H) 11/30/2023   EOSABS 0.4 11/30/2023   BASOSABS 0.1 11/30/2023     Last metabolic panel Lab Results  Component Value Date   NA 134 (L) 12/10/2023   K 4.5 12/10/2023   CL 96 (L) 12/10/2023   CO2 27 12/10/2023   BUN 18 12/10/2023   CREATININE 7.02 (H) 12/10/2023   GLUCOSE 147 (H) 12/10/2023   GFRNONAA 8 (L) 12/10/2023   GFRAA 3 (L) 08/14/2019   CALCIUM 9.5 12/10/2023   PHOS 8.0 (H) 11/23/2023   PROT 6.9 12/03/2023   ALBUMIN 2.8 (L) 12/03/2023   LABGLOB 5.1 (H) 12/17/2022   AGRATIO 0.7 11/20/2022   BILITOT 0.7 12/03/2023   ALKPHOS 88 12/03/2023   AST 35 12/03/2023   ALT 21 12/03/2023   ANIONGAP 11 12/10/2023    CBG (last 3)  Recent Labs    12/09/23 2045 12/10/23 0624 12/10/23 1131  GLUCAP 142* 98 94      Coagulation Profile: No results for input(s): "INR", "PROTIME" in the last 168 hours.   Radiology Studies: I have personally reviewed the imaging studies  No results found.       Thad Ranger M.D. [facility] 12/10/2023, 12:01 PM  Available via Epic secure chat 7am-7pm After 7 pm, please refer to night coverage provider listed on amion.

## 2023-12-10 NOTE — Plan of Care (Signed)
  Problem: Education: Goal: Knowledge of General Education information will improve Description: Including pain rating scale, medication(s)/side effects and non-pharmacologic comfort measures Outcome: Progressing   Problem: Health Behavior/Discharge Planning: Goal: Ability to manage health-related needs will improve Outcome: Progressing   Problem: Clinical Measurements: Goal: Ability to maintain clinical measurements within normal limits will improve Outcome: Progressing Goal: Will remain free from infection Outcome: Progressing Goal: Diagnostic test results will improve Outcome: Progressing Goal: Respiratory complications will improve Outcome: Progressing Goal: Cardiovascular complication will be avoided Outcome: Progressing   Problem: Activity: Goal: Risk for activity intolerance will decrease Outcome: Progressing   Problem: Nutrition: Goal: Adequate nutrition will be maintained Outcome: Progressing   Problem: Coping: Goal: Level of anxiety will decrease Outcome: Progressing   Problem: Elimination: Goal: Will not experience complications related to bowel motility Outcome: Progressing Goal: Will not experience complications related to urinary retention Outcome: Progressing   Problem: Pain Management: Goal: General experience of comfort will improve Outcome: Progressing   Problem: Safety: Goal: Ability to remain free from injury will improve Outcome: Progressing   Problem: Skin Integrity: Goal: Risk for impaired skin integrity will decrease Outcome: Progressing   Problem: Education: Goal: Ability to describe self-care measures that may prevent or decrease complications (Diabetes Survival Skills Education) will improve Outcome: Progressing Goal: Individualized Educational Video(s) Outcome: Progressing   Problem: Coping: Goal: Ability to adjust to condition or change in health will improve Outcome: Progressing   Problem: Fluid Volume: Goal: Ability to  maintain a balanced intake and output will improve Outcome: Progressing   Problem: Health Behavior/Discharge Planning: Goal: Ability to identify and utilize available resources and services will improve Outcome: Progressing Goal: Ability to manage health-related needs will improve Outcome: Progressing   Problem: Metabolic: Goal: Ability to maintain appropriate glucose levels will improve Outcome: Progressing   Problem: Nutritional: Goal: Maintenance of adequate nutrition will improve Outcome: Progressing Goal: Progress toward achieving an optimal weight will improve Outcome: Progressing   Problem: Skin Integrity: Goal: Risk for impaired skin integrity will decrease Outcome: Progressing   Problem: Tissue Perfusion: Goal: Adequacy of tissue perfusion will improve Outcome: Progressing   Problem: Safety: Goal: Non-violent Restraint(s) Outcome: Progressing   Problem: Education: Goal: Ability to identify signs and symptoms of gastrointestinal bleeding will improve Outcome: Progressing   Problem: Bowel/Gastric: Goal: Will show no signs and symptoms of gastrointestinal bleeding Outcome: Progressing   Problem: Fluid Volume: Goal: Will show no signs and symptoms of excessive bleeding Outcome: Progressing   Problem: Clinical Measurements: Goal: Complications related to the disease process, condition or treatment will be avoided or minimized Outcome: Progressing

## 2023-12-10 NOTE — Progress Notes (Signed)
[facility] Progress Note   Subjective:    Seen in room. No new events overnight. No cp, dyspnea Worked with PT yesterday. Feels ok for chair dialysis tomorrow.   Objective Vitals:   12/10/23 0019 12/10/23 0431 12/10/23 0801 12/10/23 0900  BP: 117/66 (!) 116/52 116/60   Pulse: 74 76 81   Resp: 16 18 20 12   Temp: 98.2 F (36.8 C) 97.9 F (36.6 C) 98 F (36.7 C)   TempSrc: Oral Oral Oral   SpO2: 97% 98% 95%   Weight:  101.4 kg    Height:       Physical Exam General: Alert, sitting up in bed, nad  Heart: RRR,+3/6 systolic murmur Lungs: CTAB, nml WOB on RA Abdomen: soft, NTND Extremities: no LE edema, R BKA Dialysis Access: LU AVF +b/t   Filed Weights   12/09/23 0825 12/09/23 1247 12/10/23 0431  Weight: 103 kg 101 kg 101.4 kg    Intake/Output Summary (Last 24 hours) at 12/10/2023 1021 Last data filed at 12/10/2023 0200 Gross per 24 hour  Intake 240 ml  Output 2000 ml  Net -1760 ml    Additional Objective Labs: Basic Metabolic Panel: Recent Labs  Lab 12/08/23 0252 12/09/23 0253 12/10/23 0301  NA 133* 130* 134*  K 5.4* 5.4* 4.5  CL 95* 93* 96*  CO2 26 24 27   GLUCOSE 235* 240* 147*  BUN 24* 30* 18  CREATININE 8.06* 9.60* 7.02*  CALCIUM 9.2 9.6 9.5   Liver Function Tests: No results for input(s): "AST", "ALT", "ALKPHOS", "BILITOT", "PROT", "ALBUMIN" in the last 168 hours.    CBC: Recent Labs  Lab 12/06/23 0224 12/07/23 0925 12/08/23 0252 12/09/23 0253 12/10/23 0301  WBC 9.2 7.7 7.2 6.8 6.3  HGB 9.7* 9.4* 9.7* 9.8* 9.9*  HCT 30.4* 29.8* 31.2* 31.2* 32.2*  MCV 92.1 93.4 94.0 93.4 94.4  PLT 264 245 235 239 240   Blood Culture    Component Value Date/Time   SDES CSF 11/19/2023 1134   SPECREQUEST NONE 11/19/2023 1134   CULT  11/19/2023 1134    NO GROWTH 3 DAYS Performed at [facility], [address] [place], [place] [address]    REPTSTATUS 11/23/2023 FINAL 11/19/2023 1134    CBG: Recent Labs  Lab 12/09/23 0605  12/09/23 1354 12/09/23 1627 12/09/23 2045 12/10/23 0624  GLUCAP 245* 108* 108* 142* 98    Medications:    aspirin EC  81 mg Oral Daily   atorvastatin  40 mg Oral Daily   Chlorhexidine Gluconate Cloth  6 each Topical Q0600   cinacalcet  30 mg Oral Q breakfast   darbepoetin (ARANESP) injection - DIALYSIS  100 mcg Subcutaneous Q Mon-1800   doxercalciferol  3 mcg Intravenous Q M,W,F-HD   feeding supplement  237 mL Oral BID BM   heparin injection (subcutaneous)  5,000 Units Subcutaneous Q8H   insulin aspart  0-5 Units Subcutaneous QHS   insulin aspart  0-9 Units Subcutaneous TID WC   insulin aspart  2 Units Subcutaneous TID WC   insulin glargine-yfgn  25 Units Subcutaneous Daily   midodrine  10 mg Oral TID WC   multivitamin  1 tablet Oral QHS   pantoprazole  40 mg Oral BID   sevelamer carbonate  4,000 mg Oral TID WC   sodium zirconium cyclosilicate  10 g Oral Daily   thiamine  100 mg Oral Daily    Dialysis Orders: [facility] MWF 4h  450/700  105kg   2/2 bath  AVF   Heparin 7000 + 2000 midrun - Hectorol 7 mcg IV three times per week   Assessment/Plan: 1.AMS: Was on cefepime which has been stopped. Per primary team some concern for encephalitis/meningitis, was on empiric antibiotics/antivirals until seen by ID on 11/18/23 and all was stopped.  S/P LP which ruled out bacterail meningitis/encephalitis. He did test + for flu A by resp panel. MRI without acute abnormality.  ID stopped abx and antivirals and signed off. Getting trial of high-dose thiamine. Mental status has sig improved - Much improved 2.  ESRD - usual HD is MWF. Next HD 1/24.  Needs to be able to tolerate HD in chair. Orders for dialysis in chair on Friday  3. Hyperkalemia - K up this week. Ordered daily Lokelma 1/21 4. NSTEMI- work up per cardiology/primary. No plan for invasive procedure and not DAPT candidate per cardiology.  Cardiology signed off.  5. Volume - under EDW. Will need lower dry weight  on d/c. UF as tolerated.  6. Fever/Hypotension- BC with NGTD. Was started on board spectrum antibiotics/antivirals but now discontinued.  ID signed off. 7. HFrEF/history of severe AS- attempting to optimize volume with dialysis. ECHO  EF 30 to 35%. Seen by Cards - medical management of AS. Fluid restrictions and daily weights. On midodrine 10 mg 3 times daily.  8. Anemia  - Hgb 9s.  Now stable. S/p 1 unit prbc on 11/27/23.   Start Fe load 1/14.Aranesp 100 q Mon -last 1/20.  9. Metabolic bone disease -Continue renvela. VDRA on hold d/t high calcium; coming down. 10. Debility - memory is better however he remains debilitated.Needs to demonstrate HD in chair for return to outpt HD.  11. GIB/Norovirus - C diff neg.  Completed capsule endoscopy. No active bleeding.   Tomasa Blase, PA-C  [facility] 12/10/2023,10:21 AM  LOS: 32 days

## 2023-12-11 DIAGNOSIS — K2961 Other gastritis with bleeding: Secondary | ICD-10-CM

## 2023-12-11 LAB — CBC
HCT: 32 % — ABNORMAL LOW (ref 39.0–52.0)
Hemoglobin: 9.8 g/dL — ABNORMAL LOW (ref 13.0–17.0)
MCH: 28.9 pg (ref 26.0–34.0)
MCHC: 30.6 g/dL (ref 30.0–36.0)
MCV: 94.4 fL (ref 80.0–100.0)
Platelets: 249 10*3/uL (ref 150–400)
RBC: 3.39 MIL/uL — ABNORMAL LOW (ref 4.22–5.81)
RDW: 18.8 % — ABNORMAL HIGH (ref 11.5–15.5)
WBC: 7 10*3/uL (ref 4.0–10.5)
nRBC: 0 % (ref 0.0–0.2)

## 2023-12-11 LAB — BASIC METABOLIC PANEL
Anion gap: 12 (ref 5–15)
BUN: 24 mg/dL — ABNORMAL HIGH (ref 8–23)
CO2: 26 mmol/L (ref 22–32)
Calcium: 9.8 mg/dL (ref 8.9–10.3)
Chloride: 98 mmol/L (ref 98–111)
Creatinine, Ser: 8.8 mg/dL — ABNORMAL HIGH (ref 0.61–1.24)
GFR, Estimated: 6 mL/min — ABNORMAL LOW (ref >60)
Glucose, Bld: 217 mg/dL — ABNORMAL HIGH (ref 70–99)
Potassium: 5 mmol/L (ref 3.5–5.1)
Sodium: 136 mmol/L (ref 135–145)

## 2023-12-11 LAB — GLUCOSE, CAPILLARY
Glucose-Capillary: 218 mg/dL — ABNORMAL HIGH (ref 70–99)
Glucose-Capillary: 98 mg/dL (ref 70–99)
Glucose-Capillary: 102 mg/dL — ABNORMAL HIGH (ref 70–99)
Glucose-Capillary: 106 mg/dL — ABNORMAL HIGH (ref 70–99)

## 2023-12-11 MED ORDER — LIDOCAINE HCL (PF) 1 % IJ SOLN
5.0000 mL | INTRAMUSCULAR | Status: DC | PRN
Start: 2023-12-11 — End: 2023-12-11

## 2023-12-11 MED ORDER — LIDOCAINE-PRILOCAINE 2.5-2.5 % EX CREA
1.0000 | TOPICAL_CREAM | CUTANEOUS | Status: DC | PRN
Start: 2023-12-11 — End: 2023-12-11

## 2023-12-11 MED ORDER — HEPARIN SODIUM (PORCINE) 1000 UNIT/ML DIALYSIS: 1000.0000 [IU] | INTRAMUSCULAR | Status: DC | PRN

## 2023-12-11 MED ORDER — PENTAFLUOROPROP-TETRAFLUOROETH EX AERO
1.0000 | INHALATION_SPRAY | CUTANEOUS | Status: DC | PRN
Start: 2023-12-11 — End: 2023-12-11

## 2023-12-11 NOTE — Progress Notes (Signed)
Received patient in bed to unit.  Alert and oriented.  Informed consent signed and in chart.   TX duration:3.5 hours in dialysis chair.  Patient tolerated well.  Transported back to the room  Alert, without acute distress.  Hand-off given to patient's nurse.   Access used: Left upper arm fistula Access issues: none   Total UF removed: 2L Medication(s) given: midodrine   12/11/23 1300  Vitals  Temp 98.4 F (36.9 C)  Temp Source Oral  BP 106/61  MAP (mmHg) 75  Pulse Rate 73  ECG Heart Rate 75  Resp 18  Oxygen Therapy  SpO2 98 %  O2 Device Room Air  Patient Activity (if Appropriate) In chair  Pulse Oximetry Type Continuous  During Treatment Monitoring  Duration of HD Treatment -hour(s) 3.5 hour(s)  Cumulative Fluid Removed (mL) per Treatment  2000  HD Safety Checks Performed Yes  Intra-Hemodialysis Comments Tx completed;Tolerated well  Dialysis Fluid Bolus Normal Saline  Bolus Amount (mL) 300 mL  Post Treatment  Dialyzer Clearance Lightly streaked  Liters Processed 83.9  Fluid Removed (mL) 2000 mL  Tolerated HD Treatment Yes  Fistula / Graft Left Upper arm Arteriovenous fistula  Placement Date/Time: 09/22/15 0114   Placed prior to admission: Yes  Orientation: (c) Left  Access Location: Upper arm  Access Type: (c) Arteriovenous fistula  Status Deaccessed     Stacie Glaze LPN [facility]

## 2023-12-11 NOTE — Plan of Care (Signed)
  Problem: Education: Goal: Knowledge of General Education information will improve Description: Including pain rating scale, medication(s)/side effects and non-pharmacologic comfort measures Outcome: Progressing   Problem: Health Behavior/Discharge Planning: Goal: Ability to manage health-related needs will improve Outcome: Progressing   Problem: Clinical Measurements: Goal: Ability to maintain clinical measurements within normal limits will improve Outcome: Progressing Goal: Will remain free from infection Outcome: Progressing Goal: Diagnostic test results will improve Outcome: Progressing Goal: Respiratory complications will improve Outcome: Progressing Goal: Cardiovascular complication will be avoided Outcome: Progressing   Problem: Activity: Goal: Risk for activity intolerance will decrease Outcome: Progressing   Problem: Nutrition: Goal: Adequate nutrition will be maintained Outcome: Progressing   Problem: Coping: Goal: Level of anxiety will decrease Outcome: Progressing   Problem: Elimination: Goal: Will not experience complications related to bowel motility Outcome: Progressing Goal: Will not experience complications related to urinary retention Outcome: Progressing   Problem: Pain Management: Goal: General experience of comfort will improve Outcome: Progressing   Problem: Safety: Goal: Ability to remain free from injury will improve Outcome: Progressing   Problem: Skin Integrity: Goal: Risk for impaired skin integrity will decrease Outcome: Progressing   Problem: Education: Goal: Ability to describe self-care measures that may prevent or decrease complications (Diabetes Survival Skills Education) will improve Outcome: Progressing Goal: Individualized Educational Video(s) Outcome: Progressing   Problem: Coping: Goal: Ability to adjust to condition or change in health will improve Outcome: Progressing   Problem: Fluid Volume: Goal: Ability to  maintain a balanced intake and output will improve Outcome: Progressing   Problem: Health Behavior/Discharge Planning: Goal: Ability to identify and utilize available resources and services will improve Outcome: Progressing Goal: Ability to manage health-related needs will improve Outcome: Progressing   Problem: Metabolic: Goal: Ability to maintain appropriate glucose levels will improve Outcome: Progressing   Problem: Nutritional: Goal: Maintenance of adequate nutrition will improve Outcome: Progressing Goal: Progress toward achieving an optimal weight will improve Outcome: Progressing   Problem: Skin Integrity: Goal: Risk for impaired skin integrity will decrease Outcome: Progressing   Problem: Tissue Perfusion: Goal: Adequacy of tissue perfusion will improve Outcome: Progressing   Problem: Safety: Goal: Non-violent Restraint(s) Outcome: Progressing   Problem: Education: Goal: Ability to identify signs and symptoms of gastrointestinal bleeding will improve Outcome: Progressing   Problem: Bowel/Gastric: Goal: Will show no signs and symptoms of gastrointestinal bleeding Outcome: Progressing   Problem: Fluid Volume: Goal: Will show no signs and symptoms of excessive bleeding Outcome: Progressing   Problem: Clinical Measurements: Goal: Complications related to the disease process, condition or treatment will be avoided or minimized Outcome: Progressing

## 2023-12-11 NOTE — Progress Notes (Signed)
[facility]                                                                            [name] [name], is a 63 y.o. male, DOB - [birth year], EAV:[id number] Admit date - 11/08/2023    Outpatient Primary MD for the patient is Pcp, No  LOS - 33  days  Brief summary   Patient is a 63 y.o. male with PMH significant for ESRD-HD-MWF, DM2, HTN, nonischemic cardiomyopathy, mild systolic CHF, prior right BKA.  Patient presented to ED on 12/22 after experiencing an episode of chest pain, hypotension and near syncope during hemodialysis.  His initial blood pressure was low in 75/59 Troponin was significantly elevated and peaked at 9000, he was diagnosed with NSTEMI, started on heparin drip.  Patient was admitted for further workup.   Hospital course was also complicated altered mental status, influenza, GI bleeding, norovirus diarrhea. He has had a gradual recovery and has been recommended to go to SNF by PT. Has been awaiting ability to tolerate HD in a chair.  Received HD in a chair 1/24. Can dc to SNF tomorrow Palliative care consulted.  Assessment & Plan   NSTEMI-2D echo 11/09/2023 showed EF 30 -35% with regional WMA, moderate to severe aortic stenosis -Initially started on medical management with IV heparin drip, aspirin and Lipitor -Initial consideration of cardiac cath, was placed on trial of DAPT.  However after single dose of Plavix with aspirin, patient had profuse rectal bleeding, hence DAPT was held.  Per cardiology, no further plan for inpatient ischemic evaluation.  This can be revisited as outpatient when GI issues stabilized. -Cleared by GI to place on antiplatelet agent,  -Per cardiology, started on aspirin 81 mg daily, outpatient cardiac evaluation planned.  Continue statin  Acute GI bleeding- hgb remains stable 9.8. no reoccurrence while on aspirin alone - continue protonix -CTA GI bleed 1/13 showed no active GI bleeding in the imaging  -Patient was offered EGD,  colonoscopy, he declined. -Per GI, Dr. Leonides Schanz, capsule endoscopy completed, gastritis and duodenitis with small bowel angioectasia, no active bleeding  -Continue PPI, okay to start AC/antiplatelet agent from GI perspective-> started on ASA 81 mg daily -H&H stable  Norovirus diarrhea- FOBT positive on 1/3, GI pathogen panel positive for norovirus. Symptoms have resolved.   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  Currently on midodrine  -Metoprolol held due to hypotension.   -HD for volume management    ESRD-HD-MWF -Nephrology following -Continue HD per schedule HD completed in chair today   Influenza A positive -RVP positive for flu A + on 12/30.  Completed the course of Tamiflu.   Acute metabolic encephalopathy, likely due to prolonged delirium -Patient met sepsis criteria on admission, POA due to tachycardia, fever, tachypnea, altered mental status -Patient hospital stay was complicated by altered mental status.  There was initial concern for encephalitis vs meningitis. Neurology was consulted.  Patient was placed on broad-spectrum antibiotics -EEG  12/27 and 1/1 did not show any evidence of seizure activity. -MRI brain 1/3 negative for acute abnormality  -LP on 1/3,  CSF labs significant for elevated glucose and protein, not consistent with meningitis. Meningitis/encephalitis panel negative for pathogen. -ID and neurology signed off. -Mental status improving.  Sepsis physiology has resolved.   Type 2 diabetes mellitus uncontrolled -Hemoglobin A1c > 15.5 on 10/24/2023  -Continue sensitive SSI, Semglee 25 units daily, continue  NovoLog 2 units 3 times daily AC   H/o CVA Continue Lipitor   Estimated body mass index is 29.64 kg/m as calculated from the  following:   Height as of this encounter: 6\' 1"  (1.854 m).   Weight as of this encounter: 101.9 kg.  Code Status: Full code DVT Prophylaxis:  heparin injection 5,000 Units Start: 12/04/23 1445 Place and maintain sequential compression device Start: 11/20/23 0706 SCDs Start: 11/09/23 1558   Level of Care: Level of care: Progressive Family Communication:  Disposition Plan:      Remains inpatient appropriate:   Need to be able to tolerate HD in chair for outpatient HD center.  Procedures: capsule study  Consultants:   Gastroenterology Cardiology Palliative medicine  Antimicrobials:   Anti-infectives (From admission, onward)    Start     Dose/Rate Route Frequency Ordered Stop   11/18/23 1800  oseltamivir (TAMIFLU) capsule 30 mg        30 mg Oral Every M-W-F (Hemodialysis) 11/18/23 1116 11/23/23 1159   11/17/23 1800  oseltamivir (TAMIFLU) capsule 30 mg  Status:  Discontinued        30 mg Oral Every T-Th-Sa (1800) 11/17/23 0800 11/18/23 1116   11/17/23 1115  vancomycin (VANCOCIN) IVPB 1000 mg/200 mL premix  Status:  Discontinued        1,000 mg 200 mL/hr over 60 Minutes Intravenous Once in dialysis 11/17/23 1021 11/18/23 1331   11/17/23 1000  oseltamivir (TAMIFLU) capsule 30 mg        30 mg Oral  Once 11/17/23 0759 11/17/23 0952   11/16/23 1200  vancomycin (VANCOCIN) IVPB 1000 mg/200 mL premix  Status:  Discontinued        1,000 mg 200 mL/hr over 60 Minutes Intravenous Every M-W-F (Hemodialysis) 11/15/23 1658 11/18/23 1306   11/12/23 2300  vancomycin (VANCOCIN) IVPB 1000 mg/200 mL premix        1,000 mg 200 mL/hr over 60 Minutes Intravenous  Once 11/12/23 2226 11/13/23 0131   11/12/23 2000  cefTRIAXone (ROCEPHIN) 2 g in sodium chloride 0.9 % 100 mL IVPB  Status:  Discontinued        2 g 200 mL/hr over 30 Minutes Intravenous Every 12 hours 11/12/23 1627 11/18/23 1306   11/12/23 2000  ampicillin (OMNIPEN) 2 g in sodium chloride 0.9 % 100 mL IVPB  Status:  Discontinued        2  g 300 mL/hr over 20 Minutes Intravenous Every 12 hours 11/12/23 1627 11/18/23 1306   11/12/23 1730  acyclovir (ZOVIRAX) 450 mg in dextrose 5 % 100 mL IVPB  Status:  Discontinued        5 mg/kg  89.6 kg (Adjusted) 109 mL/hr over 60 Minutes Intravenous Every 24 hours 11/12/23 1627 11/18/23 1306   11/11/23 1915  piperacillin-tazobactam (ZOSYN) IVPB 2.25 g  Status:  Discontinued        2.25 g 100 mL/hr over 30 Minutes Intravenous Every 8 hours 11/11/23 1825 11/12/23 1615   11/11/23 1245  metroNIDAZOLE (FLAGYL) IVPB 500 mg  Status:  Discontinued  500 mg 100 mL/hr over 60 Minutes Intravenous Every 12 hours 11/11/23 1146 11/11/23 1825   11/11/23 1200  vancomycin (VANCOCIN) IVPB 1000 mg/200 mL premix  Status:  Discontinued        1,000 mg 200 mL/hr over 60 Minutes Intravenous Every M-W-F (Hemodialysis) 11/10/23 1458 11/15/23 1658   11/10/23 1545  vancomycin (VANCOCIN) IVPB 1000 mg/200 mL premix       Placed in "Followed by" Linked Group   1,000 mg 200 mL/hr over 60 Minutes Intravenous  Once 11/10/23 1458 11/10/23 1917   11/10/23 1545  vancomycin (VANCOCIN) IVPB 1000 mg/200 mL premix       Placed in "Followed by" Linked Group   1,000 mg 200 mL/hr over 60 Minutes Intravenous  Once 11/10/23 1458 11/10/23 1815   11/10/23 1545  ceFEPIme (MAXIPIME) 1 g in sodium chloride 0.9 % 100 mL IVPB  Status:  Discontinued        1 g 200 mL/hr over 30 Minutes Intravenous Every M-W-F 11/10/23 1458 11/10/23 1520   11/10/23 1545  ceFEPIme (MAXIPIME) 2 g in sodium chloride 0.9 % 100 mL IVPB  Status:  Discontinued        2 g 200 mL/hr over 30 Minutes Intravenous Every M-W-F 11/10/23 1520 11/11/23 1810          Medications  aspirin EC  81 mg Oral Daily   atorvastatin  40 mg Oral Daily   Chlorhexidine Gluconate Cloth  6 each Topical Q0600   cinacalcet  30 mg Oral Q breakfast   darbepoetin (ARANESP) injection - DIALYSIS  100 mcg Subcutaneous Q Mon-1800   doxercalciferol  3 mcg Intravenous Q M,W,F-HD    feeding supplement  237 mL Oral BID BM   heparin injection (subcutaneous)  5,000 Units Subcutaneous Q8H   insulin aspart  0-5 Units Subcutaneous QHS   insulin aspart  0-9 Units Subcutaneous TID WC   insulin aspart  2 Units Subcutaneous TID WC   insulin glargine-yfgn  25 Units Subcutaneous Daily   midodrine  10 mg Oral TID WC   multivitamin  1 tablet Oral QHS   pantoprazole  40 mg Oral BID   sevelamer carbonate  4,000 mg Oral TID WC   sodium zirconium cyclosilicate  10 g Oral Daily   thiamine  100 mg Oral Daily      Subjective:   [name] [name] states that he feels tired today. Denies abdominal pain or rectal bleeding. Tolerating his HD in chair without difficulty.  Objective:   Vitals:   12/10/23 2159 12/11/23 0027 12/11/23 0516 12/11/23 0800  BP: 115/62 126/65 135/69 125/69  Pulse: 80 79 77 82  Resp: 19 18 17 20   Temp: 98.2 F (36.8 C) 98.5 F (36.9 C) 97.9 F (36.6 C) 98.1 F (36.7 C)  TempSrc: Oral Oral Oral Oral  SpO2: 96% 96% 96% 97%  Weight:   101.9 kg   Height:        Intake/Output Summary (Last 24 hours) at 12/11/2023 0819 Last data filed at 12/10/2023 1853 Gross per 24 hour  Intake 320 ml  Output --  Net 320 ml     Wt Readings from Last 3 Encounters:  12/11/23 101.9 kg  10/26/23 110.5 kg  10/22/23 108.9 kg    Physical Exam General: Alert and oriented x 3, NAD Cardiovascular: S1 S2 clear, RRR.  Respiratory: CTAB, no wheezing, rales  Gastrointestinal: Soft, nontender, nondistended, NBS Ext: R BKA Neuro: no new deficits Psych: Normal affect    Data Reviewed:  I have personally reviewed following labs    CBC Lab Results  Component Value Date   WBC 6.3 12/10/2023   RBC 3.41 (L) 12/10/2023   HGB 9.9 (L) 12/10/2023   HCT 32.2 (L) 12/10/2023   MCV 94.4 12/10/2023   MCH 29.0 12/10/2023   PLT 240 12/10/2023   MCHC 30.7 12/10/2023   RDW 18.7 (H) 12/10/2023   LYMPHSABS 1.8 11/30/2023   MONOABS 1.7 (H) 11/30/2023   EOSABS 0.4 11/30/2023    BASOSABS 0.1 11/30/2023     Last metabolic panel Lab Results  Component Value Date   NA 136 12/11/2023   K 5.0 12/11/2023   CL 98 12/11/2023   CO2 26 12/11/2023   BUN 24 (H) 12/11/2023   CREATININE 8.80 (H) 12/11/2023   GLUCOSE 217 (H) 12/11/2023   GFRNONAA 6 (L) 12/11/2023   GFRAA 3 (L) 08/14/2019   CALCIUM 9.8 12/11/2023   PHOS 8.0 (H) 11/23/2023   PROT 6.9 12/03/2023   ALBUMIN 2.8 (L) 12/03/2023   LABGLOB 5.1 (H) 12/17/2022   AGRATIO 0.7 11/20/2022   BILITOT 0.7 12/03/2023   ALKPHOS 88 12/03/2023   AST 35 12/03/2023   ALT 21 12/03/2023   ANIONGAP 12 12/11/2023   Radiology Studies: I have personally reviewed the imaging studies  No results found.    Leeroy Bock M.D. [facility] 12/11/2023, 8:19 AM  Available via Epic secure chat 7am-7pm After 7 pm, please refer to night coverage provider listed on amion.

## 2023-12-11 NOTE — Progress Notes (Signed)
[facility]  Initial Hemodialysis Orders  Dialysis center: [facility]  Patient's name: [name] ALLMAN Sr. DOB: 10-Feb-1961 AKI or ESRD: ESRD  Discharge diagnosis: altered mental status, NSTEMI 2.  Allergies:  Allergies  Allergen Reactions  . Sulfa Antibiotics Diarrhea and Nausea And Vomiting    Date of First Dialysis: 09/02/14 Cause of renal disease: HTN  Dialysis Prescription: Dialysis Frequency: TIW Tx duration: 4 hours BFR: 400 DFR: 800 .(Add titration orders here if needed) EDW: 102kg  Dialyzer: 180NRe UF profile/Sodium modeling?: no Dialysis Bath: 2 K 2 Ca  Dialysis access: Access type: AVF Date placed: 06/25/16 Surgeon: VVS Needle gauge: 15g  In Center Medications: Heparin Dose: 5000 unit bolus q HD  Type: pork VDRA: Calcitriol/Hectorol none q HD Venofer: none Mircera: 150 mcg iv q 2 weeks  Next dose due: 12/14/23 Sensipar: none  Discharge labs: Hgb: 9.8 K+: 5.0 Ca: 9.8 Phos: 8.0 Alb: 2.8  Please draw routine labs. Additional labs needed: none Additional notes/follow-up: none

## 2023-12-11 NOTE — TOC Progression Note (Addendum)
Transition of Care (TOC) - Progression Note    Patient Details  Name: [name] CARGILE Sr. MRN: [id number] Date of Birth: 27-Feb-1961  Transition of Care [facility]) CM/SW Contact  Michaela Corner, [place] Phone Number: 12/11/2023, 4:29 PM  Clinical Narrative:   CSW spoke with pt about dc plan to [facility] tomorrow 1/25. CSW encouraged pt to speak with his dtr, Amber, regarding dc. While in the room w/ pt, CSW asked if it was okay to call his dtr. Pt verbally agreed. Upon calling pts dtr, CSW let her know that pt informed me not to discuss details with her. Amber asked her father if that was true, pt stated no and smiled at CSW. Per CSWs note on 1/8, pt told CSW not to speak with his dtr regarding dc plan. Amber is HCPOA, and CSW explained to pt and dtr that if pt is deemed not to have capacity then [facility] would be point of contact to make decisions. As pt is Ox4 and alert at this time, he has capacity (spoke with MD on 1/8 regarding capacity, see note for more details), he is able to make decisions for his discharge to SNF.    4:39PM: CSW spoke with pt and he has decided he wants to [facility] at this time. CSW notified treatment team.    Expected Discharge Plan: Skilled Nursing Facility Barriers to Discharge: Continued Medical Work up  Expected Discharge Plan and Services       Living arrangements for the past 2 months: Single Family Home                           HH Arranged:  (Active with [facility])           Social Determinants of Health (SDOH) Interventions SDOH Screenings   Food Insecurity: Patient Unable To Answer (11/10/2023)  Housing: Patient Unable To Answer (11/10/2023)  Transportation Needs: Unknown (11/10/2023)  Utilities: Patient Unable To Answer (11/10/2023)  Alcohol Screen: Low Risk  (02/04/2022)  Depression (PHQ2-9): Low Risk  (08/06/2022)  Tobacco Use: Low Risk  (12/02/2023)    Readmission Risk Interventions    11/05/2022    3:08 PM  Readmission Risk Prevention  Plan  Transportation Screening Complete  Medication Review (RN Care Manager) Complete  PCP or Specialist appointment within 3-5 days of discharge Complete  HRI or Home Care Consult Complete  SW Recovery Care/Counseling Consult Complete  Palliative Care Screening Complete  Skilled Nursing Facility Complete

## 2023-12-11 NOTE — [place] FL2 (Signed)
[place] MEDICAID FL2 LEVEL OF CARE FORM     IDENTIFICATION  Patient Name: [name] RETHERFORD Sr. Birthdate: 1961/01/26 Sex: male Admission Date (Current Location): 11/08/2023  [facility] and [place] Number:  Producer, television/film/video and Address:  [facility] [place], [address] [place], [place] [address]      Provider Number: [id number]  Attending Physician Name and Address:  Leeroy Bock, MD  Relative Name and Phone Number:  Vance Gather (Daughter)  [phone] (Mobile)    Current Level of Care: Hospital Recommended Level of Care: Skilled Nursing Facility Prior Approval Number:    Date Approved/Denied:   PASRR Number: [id number]  Discharge Plan: SNF    Current Diagnoses: Patient Active Problem List   Diagnosis Date Noted   Iron deficiency anemia 12/01/2023   Ischemic colitis (HCC) 11/27/2023   Bloody diarrhea 11/27/2023   Heme positive stool 11/25/2023   Acute gastroenteropathy due to Norovirus 11/25/2023   Aortic valve stenosis 11/15/2023   Atrial flutter with rapid ventricular response (HCC) 11/10/2023   NSTEMI (non-ST elevated myocardial infarction) (HCC) 11/08/2023   Hyperglycemia 10/24/2023   Abnormal EKG 10/24/2023   RUQ abdominal pain 10/30/2022   Cholelithiasis 10/29/2022   Hyperbilirubinemia 10/29/2022   Volume overload 10/19/2022   CHF (congestive heart failure) (HCC) 10/18/2022   Hyperosmolar hyperglycemic state (HHS) (HCC)    Diarrhea    Dehiscence of amputation stump of right lower extremity (HCC)    Abscess of right lower leg    Pressure injury of skin 07/02/2022   Cellulitis of right leg 07/01/2022   CAD (coronary artery disease) 05/24/2022   Leukocytosis 05/24/2022   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/23/2022   Obesity (BMI 30-39.9) 02/19/2022   Hypotension 02/08/2022   History of CVA  02/05/2022   Carotid stenosis 02/05/2022   Hyperkalemia 02/03/2022   acute on chronic Anemia of chronic renal  failure  02/03/2022   Subacute osteomyelitis, right ankle and foot (HCC) 02/02/2022   Acute metabolic encephalopathy 02/02/2022   Sepsis with encephalopathy and septic shock (HCC) 02/02/2022   Protein-calorie malnutrition, mild (HCC) 02/02/2022   GERD (gastroesophageal reflux disease) 02/02/2022   Acute on chronic systolic CHF (congestive heart failure) (HCC) 08/14/2019   LBBB (left bundle branch block) 08/13/2019   Problem with vascular access 08/13/2019   Dialysis AV fistula malfunction (HCC) 08/12/2019   ESRD on dialysis (HCC) 09/20/2018   DM2 (diabetes mellitus, type 2) (HCC) 09/20/2018   HTN (hypertension) 09/20/2018    Orientation RESPIRATION BLADDER Height & Weight     Self  Normal Continent Weight:  (Patient in dialysis chair and cannot stand for weight due to R BKA) Height:  6\' 1"  (185.4 cm)  BEHAVIORAL SYMPTOMS/MOOD NEUROLOGICAL BOWEL NUTRITION STATUS      Incontinent Diet (see d/c summary)  AMBULATORY STATUS COMMUNICATION OF NEEDS Skin   Extensive Assist Verbally PU Stage and Appropriate Care, Other (Comment) (pressure injury buttocks stage 3; skin tear perenium)                       Personal Care Assistance Level of Assistance  Bathing, Feeding, Dressing Bathing Assistance: Maximum assistance Feeding assistance: Limited assistance Dressing Assistance: Maximum assistance     Functional Limitations Info  Sight, Hearing, Speech Sight Info: Impaired Hearing Info: Adequate Speech Info: Impaired    SPECIAL CARE FACTORS FREQUENCY  PT (By licensed PT), OT (By licensed OT)     PT Frequency: 5x/week OT Frequency: 5x/week  Speech Therapy Frequency: 5x/week      Contractures Contractures Info: Not present    Additional Factors Info  Code Status, Allergies Code Status Info: Full code Allergies Info: Sulfa Antibiotics   Insulin Sliding Scale Info: See dc summary Isolation Precautions Info: MRSA, Airborne and Contact precautions     Current  Medications (12/11/2023):  This is the current hospital active medication list Current Facility-Administered Medications  Medication Dose Route Frequency Provider Last Rate Last Admin   acetaminophen (TYLENOL) tablet 650 mg  650 mg Oral Q6H PRN Arrien, York Ram, MD   650 mg at 12/06/23 2157   Or   acetaminophen (TYLENOL) suppository 650 mg  650 mg Rectal Q6H PRN Coralie Keens, MD   650 mg at 11/10/23 2045   aspirin EC tablet 81 mg  81 mg Oral Daily Rai, Ripudeep K, MD   81 mg at 12/11/23 1401   atorvastatin (LIPITOR) tablet 40 mg  40 mg Oral Daily Arrien, York Ram, MD   40 mg at 12/11/23 1400   Chlorhexidine Gluconate Cloth 2 % PADS 6 each  6 each Topical Q0600 Delano Metz, MD   6 each at 12/10/23 0656   cinacalcet (SENSIPAR) tablet 30 mg  30 mg Oral Q breakfast Arrien, York Ram, MD   30 mg at 12/11/23 0444   Darbepoetin Alfa (ARANESP) injection 100 mcg  100 mcg Subcutaneous Q Mon-1800 Sabra Heck B, MD   100 mcg at 12/07/23 1731   doxercalciferol (HECTOROL) injection 3 mcg  3 mcg Intravenous Q M,W,F-HD Lenny Pastel, PA-C   3 mcg at 12/11/23 1025   feeding supplement (ENSURE ENLIVE / ENSURE PLUS) liquid 237 mL  237 mL Oral BID BM Narda Bonds, MD   237 mL at 12/10/23 1422   heparin injection 5,000 Units  5,000 Units Subcutaneous Q8H Rai, Ripudeep K, MD   5,000 Units at 12/11/23 1401   insulin aspart (novoLOG) injection 0-5 Units  0-5 Units Subcutaneous QHS Hazeline Junker B, MD   3 Units at 12/04/23 2102   insulin aspart (novoLOG) injection 0-9 Units  0-9 Units Subcutaneous TID WC Hazeline Junker B, MD   1 Units at 12/10/23 1736   insulin aspart (novoLOG) injection 2 Units  2 Units Subcutaneous TID WC Rai, Ripudeep K, MD   2 Units at 12/11/23 1443   insulin glargine-yfgn (SEMGLEE) injection 25 Units  25 Units Subcutaneous Daily Dahal, Binaya, MD   25 Units at 12/11/23 1443   iohexol (OMNIPAQUE) 350 MG/ML injection 100 mL  100 mL Intravenous Once PRN Imogene Burn,  MD       midodrine (PROAMATINE) tablet 10 mg  10 mg Oral TID WC Pola Corn, NP   10 mg at 12/11/23 1241   multivitamin (RENA-VIT) tablet 1 tablet  1 tablet Oral QHS Narda Bonds, MD   1 tablet at 12/10/23 2131   ondansetron (ZOFRAN) tablet 4 mg  4 mg Oral Q6H PRN Arrien, York Ram, MD       Or   ondansetron [facility]) injection 4 mg  4 mg Intravenous Q6H PRN Arrien, York Ram, MD       Oral care mouth rinse  15 mL Mouth Rinse PRN Baron Hamper, MD       pantoprazole (PROTONIX) EC tablet 40 mg  40 mg Oral BID Leander Rams, RPH   40 mg at 12/11/23 1402   sevelamer carbonate (RENVELA) tablet 2,400 mg  2,400 mg Oral BID PRN Arrien, York Ram, MD  sevelamer carbonate (RENVELA) tablet 4,000 mg  4,000 mg Oral TID WC Arrien, York Ram, MD   4,000 mg at 12/11/23 1400   sodium zirconium cyclosilicate (LOKELMA) packet 10 g  10 g Oral Daily Tomasa Blase, PA-C   10 g at 12/11/23 1403   thiamine (VITAMIN B1) tablet 100 mg  100 mg Oral Daily Erick Blinks, MD   100 mg at 12/11/23 1400     Discharge Medications: Please see discharge summary for a list of discharge medications.  Relevant Imaging Results:  Relevant Lab Results:   Additional Information SS#: [id number]; HD pt - [facility] Lorina Rabon MWF 11:00AM  Michaela Corner, LCSWA

## 2023-12-11 NOTE — Progress Notes (Signed)
[facility] Progress Note   Subjective:   Patient seen on dialysis, sitting up in chair today. He is hoping to discharge soon. Denies SOB, CP, dizziness, nausea.   Objective Vitals:   12/10/23 2159 12/11/23 0027 12/11/23 0516 12/11/23 0800  BP: 115/62 126/65 135/69 125/69  Pulse: 80 79 77 82  Resp: 19 18 17 20   Temp: 98.2 F (36.8 C) 98.5 F (36.9 C) 97.9 F (36.6 C) 98.1 F (36.7 C)  TempSrc: Oral Oral Oral Oral  SpO2: 96% 96% 96% 97%  Weight:   101.9 kg   Height:       Physical Exam General: Alert male in NAD Heart: RRR, 3/6 systolic murmur Lungs: CTA bilaterally, respirations unlabored on RA Abdomen: Soft, non-distended, +BS Extremities: No edema b/l lower extremities. R BKA Dialysis Access: AVF accessed  Additional Objective Labs: Basic Metabolic Panel: Recent Labs  Lab 12/09/23 0253 12/10/23 0301 12/11/23 0256  NA 130* 134* 136  K 5.4* 4.5 5.0  CL 93* 96* 98  CO2 24 27 26   GLUCOSE 240* 147* 217*  BUN 30* 18 24*  CREATININE 9.60* 7.02* 8.80*  CALCIUM 9.6 9.5 9.8   Liver Function Tests: No results for input(s): "AST", "ALT", "ALKPHOS", "BILITOT", "PROT", "ALBUMIN" in the last 168 hours. No results for input(s): "LIPASE", "AMYLASE" in the last 168 hours. CBC: Recent Labs  Lab 12/06/23 0224 12/07/23 0925 12/08/23 0252 12/09/23 0253 12/10/23 0301  WBC 9.2 7.7 7.2 6.8 6.3  HGB 9.7* 9.4* 9.7* 9.8* 9.9*  HCT 30.4* 29.8* 31.2* 31.2* 32.2*  MCV 92.1 93.4 94.0 93.4 94.4  PLT 264 245 235 239 240   Blood Culture    Component Value Date/Time   SDES CSF 11/19/2023 1134   SPECREQUEST NONE 11/19/2023 1134   CULT  11/19/2023 1134    NO GROWTH 3 DAYS Performed at [facility], [address] [place], [place] [address]    REPTSTATUS 11/23/2023 FINAL 11/19/2023 1134    Cardiac Enzymes: No results for input(s): "CKTOTAL", "CKMB", "CKMBINDEX", "TROPONINI" in the last 168 hours. CBG: Recent Labs  Lab 12/10/23 0624 12/10/23 1131  12/10/23 1630 12/10/23 2159 12/11/23 0617  GLUCAP 98 94 142* 138* 218*   Iron Studies: No results for input(s): "IRON", "TIBC", "TRANSFERRIN", "FERRITIN" in the last 72 hours. @lablastinr3 @ Studies/Results: No results found. Medications:   aspirin EC  81 mg Oral Daily   atorvastatin  40 mg Oral Daily   Chlorhexidine Gluconate Cloth  6 each Topical Q0600   cinacalcet  30 mg Oral Q breakfast   darbepoetin (ARANESP) injection - DIALYSIS  100 mcg Subcutaneous Q Mon-1800   doxercalciferol  3 mcg Intravenous Q M,W,F-HD   feeding supplement  237 mL Oral BID BM   heparin injection (subcutaneous)  5,000 Units Subcutaneous Q8H   insulin aspart  0-5 Units Subcutaneous QHS   insulin aspart  0-9 Units Subcutaneous TID WC   insulin aspart  2 Units Subcutaneous TID WC   insulin glargine-yfgn  25 Units Subcutaneous Daily   midodrine  10 mg Oral TID WC   multivitamin  1 tablet Oral QHS   pantoprazole  40 mg Oral BID   sevelamer carbonate  4,000 mg Oral TID WC   sodium zirconium cyclosilicate  10 g Oral Daily   thiamine  100 mg Oral Daily    Dialysis Orders: [facility] MWF 4h  450/700  105kg   2/2 bath   AVF   Heparin 7000 + 2000 midrun - Hectorol  7 mcg IV three times per week  Assessment/Plan: 1.AMS: Was on cefepime which has been stopped. Per primary team some concern for encephalitis/meningitis, was on empiric antibiotics/antivirals until seen by ID on 11/18/23 and all was stopped.  S/P LP which ruled out bacterail meningitis/encephalitis. He did test + for flu A by resp panel. MRI without acute abnormality.  ID stopped abx and antivirals and signed off. Getting trial of high-dose thiamine. Mental status has improved 2.  ESRD - usual HD is MWF. Tolerating HD in a chair so far today, which is a requirement for outpatient HD 3. Hyperkalemia - elevated K+ previously, required lokelma. K+ 5.0 today. Mgt with HD 4. NSTEMI- work up per cardiology/primary. No plan for invasive  procedure and not DAPT candidate per cardiology.  5. Volume - No significant volume overload on exam. Will need lower dry weight on d/c. UF as tolerated.  6. Fever/Hypotension- BC with NGTD. Was started on board spectrum antibiotics/antivirals but now discontinued.  ID signed off. 7. HFrEF/history of severe AS- attempting to optimize volume with dialysis. ECHO  EF 30 to 35%. Seen by Cards - medical management of AS. Fluid restrictions and daily weights. On midodrine 10 mg 3 times daily.  8. Anemia  - Hgb 9s.  Now stable. S/p 1 unit prbc on 11/27/23.   Start Fe load 1/14.Aranesp 100 q Mon -last 1/20.  9. Metabolic bone disease -Continue renvela. VDRA on hold d/t high calcium; coming down. 10. Debility - memory is better however he remains debilitated. Working with PT  11. GIB/Norovirus - C diff neg.  Completed capsule endoscopy. No active bleeding.   Rogers Blocker, PA-C 12/11/2023, 8:30 AM  [facility] Pager: [phone]

## 2023-12-11 NOTE — Plan of Care (Signed)
Pt vital signs in normal range

## 2023-12-11 NOTE — Progress Notes (Addendum)
Case discussed with CSW and renal PA earlier today. Pt was able to tolerate HD in chair today. Contacted [facility] to confirm pt's schedule at d/c. Pt will have a MWF 11:15 am chair time. Pt can start on Monday and will need to arrive at 10:30 am to complete paperwork prior to treatment. This info provided to CSW to provide to snf. Also requested that CSW please advise snf to send a hoyer pad/sling under pt in w/c to HD appts so pt can tx from w/c to HD chair. Renal PA provided update as well and orders sent to clinic today for Monday's treatment per clinic's request. HD arrangements placed on AVS. Clinic advised that pt should d/c to snf tomorrow and should start on Monday. Will assist as needed.   Olivia Canter Renal Navigator [phone]   Addendum at 4:46 pm: Advised by CSW this afternoon that pt is now interested in a different snf closer to his regular HD clinic. Contacted pt's regular HD clinic [facility]) to inquire if pt's HD appt is still available. Staff that can answer that question have left for the day. Navigator will have to f/u on Monday. This info provided to CSW.

## 2023-12-12 LAB — GLUCOSE, CAPILLARY
Glucose-Capillary: 153 mg/dL — ABNORMAL HIGH (ref 70–99)
Glucose-Capillary: 132 mg/dL — ABNORMAL HIGH (ref 70–99)
Glucose-Capillary: 93 mg/dL (ref 70–99)
Glucose-Capillary: 140 mg/dL — ABNORMAL HIGH (ref 70–99)
Glucose-Capillary: 140 mg/dL — ABNORMAL HIGH (ref 70–99)

## 2023-12-12 NOTE — Progress Notes (Signed)
[facility] Progress Note   Subjective:   Tolerated HD in chair yesterday. Awaiting SNF/dispo plan. He denies any concerns today.   Objective Vitals:   12/11/23 1447 12/11/23 1632 12/12/23 0535 12/12/23 0755  BP: 129/67 117/68 (!) 114/56 95/71  Pulse: 83 77 79 71  Resp: 20 20 18 18   Temp: 98.8 F (37.1 C) 98.2 F (36.8 C) 98.4 F (36.9 C) 97.8 F (36.6 C)  TempSrc: Oral Oral Oral Oral  SpO2: 98% 98% 99% 97%  Weight:   99.9 kg   Height:       Physical Exam General: Alert male in NAD Heart: RRR, 3/6 systolic murmur Lungs: CTA bilaterally, respirations unlabored on RA Abdomen: Soft, non-distended, +BS Extremities: no edema b/l lower extremities Dialysis Access: AVF + t/b  Additional Objective Labs: Basic Metabolic Panel: Recent Labs  Lab 12/09/23 0253 12/10/23 0301 12/11/23 0256  NA 130* 134* 136  K 5.4* 4.5 5.0  CL 93* 96* 98  CO2 24 27 26   GLUCOSE 240* 147* 217*  BUN 30* 18 24*  CREATININE 9.60* 7.02* 8.80*  CALCIUM 9.6 9.5 9.8   Liver Function Tests: No results for input(s): "AST", "ALT", "ALKPHOS", "BILITOT", "PROT", "ALBUMIN" in the last 168 hours. No results for input(s): "LIPASE", "AMYLASE" in the last 168 hours. CBC: Recent Labs  Lab 12/07/23 0925 12/08/23 0252 12/09/23 0253 12/10/23 0301 12/11/23 0832  WBC 7.7 7.2 6.8 6.3 7.0  HGB 9.4* 9.7* 9.8* 9.9* 9.8*  HCT 29.8* 31.2* 31.2* 32.2* 32.0*  MCV 93.4 94.0 93.4 94.4 94.4  PLT 245 235 239 240 249   Blood Culture    Component Value Date/Time   SDES CSF 11/19/2023 1134   SPECREQUEST NONE 11/19/2023 1134   CULT  11/19/2023 1134    NO GROWTH 3 DAYS Performed at [facility], [address] [place], [place] [address]    REPTSTATUS 11/23/2023 FINAL 11/19/2023 1134    Cardiac Enzymes: No results for input(s): "CKTOTAL", "CKMB", "CKMBINDEX", "TROPONINI" in the last 168 hours. CBG: Recent Labs  Lab 12/11/23 1244 12/11/23 1632 12/11/23 2024 12/12/23 0644  12/12/23 0755  GLUCAP 98 102* 106* 153* 132*   Iron Studies: No results for input(s): "IRON", "TIBC", "TRANSFERRIN", "FERRITIN" in the last 72 hours. @lablastinr3 @ Studies/Results: No results found. Medications:   aspirin EC  81 mg Oral Daily   atorvastatin  40 mg Oral Daily   Chlorhexidine Gluconate Cloth  6 each Topical Q0600   cinacalcet  30 mg Oral Q breakfast   darbepoetin (ARANESP) injection - DIALYSIS  100 mcg Subcutaneous Q Mon-1800   doxercalciferol  3 mcg Intravenous Q M,W,F-HD   feeding supplement  237 mL Oral BID BM   heparin injection (subcutaneous)  5,000 Units Subcutaneous Q8H   insulin aspart  0-5 Units Subcutaneous QHS   insulin aspart  0-9 Units Subcutaneous TID WC   insulin aspart  2 Units Subcutaneous TID WC   insulin glargine-yfgn  25 Units Subcutaneous Daily   midodrine  10 mg Oral TID WC   multivitamin  1 tablet Oral QHS   pantoprazole  40 mg Oral BID   sevelamer carbonate  4,000 mg Oral TID WC   sodium zirconium cyclosilicate  10 g Oral Daily   thiamine  100 mg Oral Daily    Dialysis Orders: [facility] MWF 4h  450/700  105kg   2/2 bath   AVF   Heparin 7000 + 2000 midrun - Hectorol 7 mcg IV three times per week  Assessment/Plan: 1.AMS: Was on cefepime which has been stopped. Per primary team some concern for encephalitis/meningitis, was on empiric antibiotics/antivirals until seen by ID on 11/18/23 and all was stopped.  S/P LP which ruled out bacterail meningitis/encephalitis. He did test + for flu A by resp panel. MRI without acute abnormality.  ID stopped abx and antivirals and signed off. Getting trial of high-dose thiamine. Mental status has improved 2.  ESRD - usual HD is MWF. Next HD Monday 3. Hyperkalemia - elevated K+ previously, required lokelma. Improved.  4. NSTEMI- work up per cardiology/primary. No plan for invasive procedure and not DAPT candidate per cardiology.  5. Volume - No significant volume overload on exam. Will  need lower dry weight on d/c.  6. Fever/Hypotension- BC with NGTD. Was started on board spectrum antibiotics/antivirals but now discontinued.  ID signed off. 7. HFrEF/history of severe AS- attempting to optimize volume with dialysis. ECHO  EF 30 to 35%. Seen by Cards - medical management of AS. Fluid restrictions and daily weights. On midodrine 10 mg 3 times daily.  8. Anemia  - Hgb 9s.  Now stable. S/p 1 unit prbc on 11/27/23.   Start Fe load 1/14.Aranesp 100 q Mon -last 1/20.  9. Metabolic bone disease -Continue renvela. VDRA on hold d/t high calcium; coming down. 10. Debility - memory is better however he remains debilitated. Working with PT  11. GIB/Norovirus - C diff neg.  Completed capsule endoscopy. No active bleeding.     Rogers Blocker, PA-C 12/12/2023, 8:40 AM  [facility] Pager: [phone]

## 2023-12-12 NOTE — Plan of Care (Signed)
  Problem: Education: Goal: Knowledge of General Education information will improve Description: Including pain rating scale, medication(s)/side effects and non-pharmacologic comfort measures Outcome: Progressing   Problem: Health Behavior/Discharge Planning: Goal: Ability to manage health-related needs will improve Outcome: Progressing   Problem: Clinical Measurements: Goal: Ability to maintain clinical measurements within normal limits will improve Outcome: Progressing Goal: Will remain free from infection Outcome: Progressing Goal: Diagnostic test results will improve Outcome: Progressing Goal: Respiratory complications will improve Outcome: Progressing Goal: Cardiovascular complication will be avoided Outcome: Progressing   Problem: Activity: Goal: Risk for activity intolerance will decrease Outcome: Progressing   Problem: Nutrition: Goal: Adequate nutrition will be maintained Outcome: Progressing   Problem: Coping: Goal: Level of anxiety will decrease Outcome: Progressing   Problem: Elimination: Goal: Will not experience complications related to bowel motility Outcome: Progressing Goal: Will not experience complications related to urinary retention Outcome: Progressing   Problem: Pain Management: Goal: General experience of comfort will improve Outcome: Progressing   Problem: Safety: Goal: Ability to remain free from injury will improve Outcome: Progressing   Problem: Skin Integrity: Goal: Risk for impaired skin integrity will decrease Outcome: Progressing   Problem: Education: Goal: Ability to describe self-care measures that may prevent or decrease complications (Diabetes Survival Skills Education) will improve Outcome: Progressing Goal: Individualized Educational Video(s) Outcome: Progressing   Problem: Coping: Goal: Ability to adjust to condition or change in health will improve Outcome: Progressing   Problem: Fluid Volume: Goal: Ability to  maintain a balanced intake and output will improve Outcome: Progressing   Problem: Health Behavior/Discharge Planning: Goal: Ability to identify and utilize available resources and services will improve Outcome: Progressing Goal: Ability to manage health-related needs will improve Outcome: Progressing   Problem: Metabolic: Goal: Ability to maintain appropriate glucose levels will improve Outcome: Progressing   Problem: Nutritional: Goal: Maintenance of adequate nutrition will improve Outcome: Progressing Goal: Progress toward achieving an optimal weight will improve Outcome: Progressing   Problem: Skin Integrity: Goal: Risk for impaired skin integrity will decrease Outcome: Progressing   Problem: Tissue Perfusion: Goal: Adequacy of tissue perfusion will improve Outcome: Progressing   Problem: Safety: Goal: Non-violent Restraint(s) Outcome: Progressing   Problem: Education: Goal: Ability to identify signs and symptoms of gastrointestinal bleeding will improve Outcome: Progressing   Problem: Bowel/Gastric: Goal: Will show no signs and symptoms of gastrointestinal bleeding Outcome: Progressing   Problem: Fluid Volume: Goal: Will show no signs and symptoms of excessive bleeding Outcome: Progressing   Problem: Clinical Measurements: Goal: Complications related to the disease process, condition or treatment will be avoided or minimized Outcome: Progressing

## 2023-12-12 NOTE — Plan of Care (Signed)
Problem: Education: Goal: Knowledge of General Education information will improve Description Including pain rating scale, medication(s)/side effects and non-pharmacologic comfort measures Outcome: Progressing

## 2023-12-12 NOTE — Progress Notes (Signed)
Progress Note    [name] FORTSON Sr.  ZOX:[id number] DOB: [birth year]  DOA: 11/08/2023 PCP: Pcp, No      Brief Narrative:    Medical records reviewed and are as summarized below:  [name] Lance Sr. is a 63 y.o. male with PMH significant for ESRD-HD-MWF, DM2, HTN, nonischemic cardiomyopathy, mild systolic CHF, prior right BKA.  Patient presented to ED on 12/22 after experiencing an episode of chest pain, hypotension and near syncope during hemodialysis.  His initial blood pressure was low in 75/59 Troponin was significantly elevated and peaked at 9000, he was diagnosed with NSTEMI, started on heparin drip.  Patient was admitted for further workup.   Hospital course was also complicated altered mental status, influenza, GI bleeding, norovirus diarrhea. He has had a gradual recovery and has been recommended to go to SNF by PT. Has been awaiting ability to tolerate HD in a chair.  Received HD in a chair 1/24.     Assessment/Plan:   Principal Problem:   NSTEMI (non-ST elevated myocardial infarction) (HCC) Active Problems:   Sepsis with encephalopathy and septic shock (HCC)   ESRD on dialysis (HCC)   Hypotension   Acute on chronic systolic CHF (congestive heart failure) (HCC)   HTN (hypertension)   Acute metabolic encephalopathy   DM2 (diabetes mellitus, type 2) (HCC)   History of CVA    Protein-calorie malnutrition, mild (HCC)   Obesity (BMI 30-39.9)   Atrial flutter with rapid ventricular response (HCC)   Aortic valve stenosis   Heme positive stool   Acute gastroenteropathy due to Norovirus   Ischemic colitis (HCC)   Bloody diarrhea   Iron deficiency anemia   Nutrition Problem: Inadequate oral intake Etiology: acute illness  Signs/Symptoms: other (comment), energy intake < 75% for > 7 days (full liquid diet)   Body mass index is 29.06 kg/m.    NSTEMI-2D echo 11/09/2023 showed EF 30 -35% with regional WMA, moderate to severe aortic stenosis -Initially started  on medical management with IV heparin drip, aspirin and Lipitor -Initial consideration of cardiac cath, was placed on trial of DAPT.  However after single dose of Plavix with aspirin, patient had profuse rectal bleeding, hence DAPT was held.  Per cardiology, no further plan for inpatient ischemic evaluation.  This can be revisited as outpatient when GI issues stabilized. -Per cardiology, started on aspirin 81 mg daily, outpatient cardiac evaluation planned.  Continue low-dose aspirin and statin    Acute GI bleeding- H&H is stable.   - continue protonix -CTA GI bleed 1/13 showed no active GI bleeding in the imaging  -Patient was offered EGD, colonoscopy, he declined. -Per GI, Dr. Leonides Schanz, capsule endoscopy completed, gastritis and duodenitis with small bowel angioectasia, no active bleeding  GI is okay with low-dose aspirin    Norovirus diarrhea- FOBT positive on 1/3, GI pathogen panel positive for norovirus. Symptoms have resolved.     Acute exacerbation of combined systolic and diastolic CHF Moderate to severe aortic stenosis -2D echo 12/23 showed EF 30 -35% with regional WMA, grade 3 DD, mildly reduced RV systolic function, moderate to severe aortic valve stenosis  -GDMT limited by ESRD and low BP.  Continue midodrine -Metoprolol held due to hypotension.   -HD for volume management     ESRD-HD-MWF Follow-up with nephrologist for hemodialysis. He completed hemodialysis chair on 12/11/2023    Influenza A positive -RVP positive for flu A + on 12/30.  Completed the course of Tamiflu.    Acute metabolic  encephalopathy, likely due to prolonged delirium -Patient met sepsis criteria on admission, POA due to tachycardia, fever, tachypnea, altered mental status -Patient hospital stay was complicated by altered mental status.  There was initial concern for encephalitis vs meningitis. Neurology was consulted.  Patient was placed on broad-spectrum antibiotics -EEG 12/27 and 1/1 did not show  any evidence of seizure activity. -MRI brain 1/3 negative for acute abnormality  -LP on 1/3,  CSF labs significant for elevated glucose and protein, not consistent with meningitis. Meningitis/encephalitis panel negative for pathogen. -ID and neurology signed off. -Mental status improving.  Sepsis physiology has resolved.    Type 2 diabetes mellitus uncontrolled -Hemoglobin A1c > 15.5 on 10/24/2023  -Continue sensitive SSI, Semglee 25 units daily, continue  NovoLog 2 units 3 times daily AC    H/o CVA Continue Lipitor        Diet Order             DIET SOFT Fluid consistency: Thin; Fluid restriction: 2000 mL Fluid  Diet effective now                            Consultants: Cardiologist Gastroenterologist Nephrologist  Procedures: Video capsule endoscopy    Medications:    aspirin EC  81 mg Oral Daily   atorvastatin  40 mg Oral Daily   Chlorhexidine Gluconate Cloth  6 each Topical Q0600   cinacalcet  30 mg Oral Q breakfast   darbepoetin (ARANESP) injection - DIALYSIS  100 mcg Subcutaneous Q Mon-1800   doxercalciferol  3 mcg Intravenous Q M,W,F-HD   feeding supplement  237 mL Oral BID BM   heparin injection (subcutaneous)  5,000 Units Subcutaneous Q8H   insulin aspart  0-5 Units Subcutaneous QHS   insulin aspart  0-9 Units Subcutaneous TID WC   insulin aspart  2 Units Subcutaneous TID WC   insulin glargine-yfgn  25 Units Subcutaneous Daily   midodrine  10 mg Oral TID WC   multivitamin  1 tablet Oral QHS   pantoprazole  40 mg Oral BID   sevelamer carbonate  4,000 mg Oral TID WC   sodium zirconium cyclosilicate  10 g Oral Daily   thiamine  100 mg Oral Daily   Continuous Infusions:   Anti-infectives (From admission, onward)    Start     Dose/Rate Route Frequency Ordered Stop   11/18/23 1800  oseltamivir (TAMIFLU) capsule 30 mg        30 mg Oral Every M-W-F (Hemodialysis) 11/18/23 1116 11/23/23 1159   11/17/23 1800  oseltamivir (TAMIFLU) capsule  30 mg  Status:  Discontinued        30 mg Oral Every T-Th-Sa (1800) 11/17/23 0800 11/18/23 1116   11/17/23 1115  vancomycin (VANCOCIN) IVPB 1000 mg/200 mL premix  Status:  Discontinued        1,000 mg 200 mL/hr over 60 Minutes Intravenous Once in dialysis 11/17/23 1021 11/18/23 1331   11/17/23 1000  oseltamivir (TAMIFLU) capsule 30 mg        30 mg Oral  Once 11/17/23 0759 11/17/23 0952   11/16/23 1200  vancomycin (VANCOCIN) IVPB 1000 mg/200 mL premix  Status:  Discontinued        1,000 mg 200 mL/hr over 60 Minutes Intravenous Every M-W-F (Hemodialysis) 11/15/23 1658 11/18/23 1306   11/12/23 2300  vancomycin (VANCOCIN) IVPB 1000 mg/200 mL premix        1,000 mg 200 mL/hr over 60 Minutes Intravenous  Once  11/12/23 2226 11/13/23 0131   11/12/23 2000  cefTRIAXone (ROCEPHIN) 2 g in sodium chloride 0.9 % 100 mL IVPB  Status:  Discontinued        2 g 200 mL/hr over 30 Minutes Intravenous Every 12 hours 11/12/23 1627 11/18/23 1306   11/12/23 2000  ampicillin (OMNIPEN) 2 g in sodium chloride 0.9 % 100 mL IVPB  Status:  Discontinued        2 g 300 mL/hr over 20 Minutes Intravenous Every 12 hours 11/12/23 1627 11/18/23 1306   11/12/23 1730  acyclovir (ZOVIRAX) 450 mg in dextrose 5 % 100 mL IVPB  Status:  Discontinued        5 mg/kg  89.6 kg (Adjusted) 109 mL/hr over 60 Minutes Intravenous Every 24 hours 11/12/23 1627 11/18/23 1306   11/11/23 1915  piperacillin-tazobactam (ZOSYN) IVPB 2.25 g  Status:  Discontinued        2.25 g 100 mL/hr over 30 Minutes Intravenous Every 8 hours 11/11/23 1825 11/12/23 1615   11/11/23 1245  metroNIDAZOLE (FLAGYL) IVPB 500 mg  Status:  Discontinued        500 mg 100 mL/hr over 60 Minutes Intravenous Every 12 hours 11/11/23 1146 11/11/23 1825   11/11/23 1200  vancomycin (VANCOCIN) IVPB 1000 mg/200 mL premix  Status:  Discontinued        1,000 mg 200 mL/hr over 60 Minutes Intravenous Every M-W-F (Hemodialysis) 11/10/23 1458 11/15/23 1658   11/10/23 1545  vancomycin  (VANCOCIN) IVPB 1000 mg/200 mL premix       Placed in "Followed by" Linked Group   1,000 mg 200 mL/hr over 60 Minutes Intravenous  Once 11/10/23 1458 11/10/23 1917   11/10/23 1545  vancomycin (VANCOCIN) IVPB 1000 mg/200 mL premix       Placed in "Followed by" Linked Group   1,000 mg 200 mL/hr over 60 Minutes Intravenous  Once 11/10/23 1458 11/10/23 1815   11/10/23 1545  ceFEPIme (MAXIPIME) 1 g in sodium chloride 0.9 % 100 mL IVPB  Status:  Discontinued        1 g 200 mL/hr over 30 Minutes Intravenous Every M-W-F 11/10/23 1458 11/10/23 1520   11/10/23 1545  ceFEPIme (MAXIPIME) 2 g in sodium chloride 0.9 % 100 mL IVPB  Status:  Discontinued        2 g 200 mL/hr over 30 Minutes Intravenous Every M-W-F 11/10/23 1520 11/11/23 1810              Family Communication/Anticipated D/C date and plan/Code Status   DVT prophylaxis: heparin injection 5,000 Units Start: 12/04/23 1445 Place and maintain sequential compression device Start: 11/20/23 0706 SCDs Start: 11/09/23 1558     Code Status: Full Code  Family Communication: None Disposition Plan: Plan to discharge to SNF   Status is: Inpatient Remains inpatient appropriate because: Awaiting placement to SNF       Subjective:   Interval events noted.  He has no complaints.  No shortness of breath or chest pain  Objective:    Vitals:   12/11/23 1632 12/12/23 0535 12/12/23 0755 12/12/23 1200  BP: 117/68 (!) 114/56 95/71 (!) 153/82  Pulse: 77 79 71 80  Resp: 20 18 18 14   Temp: 98.2 F (36.8 C) 98.4 F (36.9 C) 97.8 F (36.6 C) 97.7 F (36.5 C)  TempSrc: Oral Oral Oral Oral  SpO2: 98% 99% 97% 100%  Weight:  99.9 kg    Height:       No data found.   Intake/Output Summary (  Last 24 hours) at 12/12/2023 1326 Last data filed at 12/12/2023 1252 Gross per 24 hour  Intake 360 ml  Output --  Net 360 ml   Filed Weights   12/11/23 0516 12/12/23 0535  Weight: 101.9 kg 99.9 kg    Exam:  GEN: NAD SKIN: No pallor  or icterus EYES: Warm and dry ENT: MMM CV: RRR PULM: CTA B ABD: soft, ND, NT, +BS CNS: AAO x 3, non focal EXT: No edema or tenderness.  Right BKA.        Data Reviewed:   I have personally reviewed following labs and imaging studies:  Labs: Labs show the following:   Basic Metabolic Panel: Recent Labs  Lab 12/07/23 0251 12/07/23 0706 12/08/23 0252 12/09/23 0253 12/10/23 0301 12/11/23 0256  NA 134*  --  133* 130* 134* 136  K 6.4*   < > 5.4* 5.4* 4.5 5.0  CL 99  --  95* 93* 96* 98  CO2 22  --  26 24 27 26   GLUCOSE 138*  --  235* 240* 147* 217*  BUN 39*  --  24* 30* 18 24*  CREATININE 11.71*  --  8.06* 9.60* 7.02* 8.80*  CALCIUM 9.8  --  9.2 9.6 9.5 9.8   < > = values in this interval not displayed.   GFR Estimated Creatinine Clearance: 11 mL/min (A) (by C-G formula based on SCr of 8.8 mg/dL (H)). Liver Function Tests: No results for input(s): "AST", "ALT", "ALKPHOS", "BILITOT", "PROT", "ALBUMIN" in the last 168 hours. No results for input(s): "LIPASE", "AMYLASE" in the last 168 hours. No results for input(s): "AMMONIA" in the last 168 hours. Coagulation profile No results for input(s): "INR", "PROTIME" in the last 168 hours.  CBC: Recent Labs  Lab 12/07/23 0925 12/08/23 0252 12/09/23 0253 12/10/23 0301 12/11/23 0832  WBC 7.7 7.2 6.8 6.3 7.0  HGB 9.4* 9.7* 9.8* 9.9* 9.8*  HCT 29.8* 31.2* 31.2* 32.2* 32.0*  MCV 93.4 94.0 93.4 94.4 94.4  PLT 245 235 239 240 249   Cardiac Enzymes: No results for input(s): "CKTOTAL", "CKMB", "CKMBINDEX", "TROPONINI" in the last 168 hours. BNP (last 3 results) No results for input(s): "PROBNP" in the last 8760 hours. CBG: Recent Labs  Lab 12/11/23 1632 12/11/23 2024 12/12/23 0644 12/12/23 0755 12/12/23 1200  GLUCAP 102* 106* 153* 132* 93   D-Dimer: No results for input(s): "DDIMER" in the last 72 hours. Hgb A1c: No results for input(s): "HGBA1C" in the last 72 hours. Lipid Profile: No results for input(s):  "CHOL", "HDL", "LDLCALC", "TRIG", "CHOLHDL", "LDLDIRECT" in the last 72 hours. Thyroid function studies: No results for input(s): "TSH", "T4TOTAL", "T3FREE", "THYROIDAB" in the last 72 hours.  Invalid input(s): "FREET3" Anemia work up: No results for input(s): "VITAMINB12", "FOLATE", "FERRITIN", "TIBC", "IRON", "RETICCTPCT" in the last 72 hours. Sepsis Labs: Recent Labs  Lab 12/08/23 0252 12/09/23 0253 12/10/23 0301 12/11/23 0832  WBC 7.2 6.8 6.3 7.0    Microbiology No results found for this or any previous visit (from the past 240 hours).  Procedures and diagnostic studies:  No results found.             LOS: 34 days   Tonae Livolsi  [employer] loss adjuster on [email or web address]. If 7PM-7AM, please contact night-coverage at [email or web address]     12/12/2023, 1:26 PM

## 2023-12-13 LAB — RENAL FUNCTION PANEL
Albumin: 3 g/dL — ABNORMAL LOW (ref 3.5–5.0)
Anion gap: 14 (ref 5–15)
BUN: 29 mg/dL — ABNORMAL HIGH (ref 8–23)
CO2: 27 mmol/L (ref 22–32)
Calcium: 9.6 mg/dL (ref 8.9–10.3)
Chloride: 95 mmol/L — ABNORMAL LOW (ref 98–111)
Creatinine, Ser: 9.03 mg/dL — ABNORMAL HIGH (ref 0.61–1.24)
GFR, Estimated: 6 mL/min — ABNORMAL LOW (ref >60)
Glucose, Bld: 168 mg/dL — ABNORMAL HIGH (ref 70–99)
Phosphorus: 3.9 mg/dL (ref 2.5–4.6)
Potassium: 4.5 mmol/L (ref 3.5–5.1)
Sodium: 136 mmol/L (ref 135–145)

## 2023-12-13 LAB — GLUCOSE, CAPILLARY
Glucose-Capillary: 150 mg/dL — ABNORMAL HIGH (ref 70–99)
Glucose-Capillary: 173 mg/dL — ABNORMAL HIGH (ref 70–99)
Glucose-Capillary: 101 mg/dL — ABNORMAL HIGH (ref 70–99)
Glucose-Capillary: 142 mg/dL — ABNORMAL HIGH (ref 70–99)

## 2023-12-13 MED ORDER — CHLORHEXIDINE GLUCONATE CLOTH 2 % EX PADS
6.0000 | MEDICATED_PAD | Freq: Every day | CUTANEOUS | Status: DC
  Administered 2023-12-13 – 2023-12-15 (×3): 6 via TOPICAL

## 2023-12-13 NOTE — Plan of Care (Signed)
Problem: Education: Goal: Knowledge of General Education information will improve Description: Including pain rating scale, medication(s)/side effects and non-pharmacologic comfort measures Outcome: Progressing   Problem: Health Behavior/Discharge Planning: Goal: Ability to manage health-related needs will improve Outcome: Progressing   Problem: Clinical Measurements: Goal: Ability to maintain clinical measurements within normal limits will improve Outcome: Progressing Goal: Will remain free from infection Outcome: Progressing Goal: Diagnostic test results will improve Outcome: Progressing Goal: Respiratory complications will improve Outcome: Progressing Goal: Cardiovascular complication will be avoided Outcome: Progressing   Problem: Activity: Goal: Risk for activity intolerance will decrease Outcome: Progressing   Problem: Nutrition: Goal: Adequate nutrition will be maintained Outcome: Progressing   Problem: Coping: Goal: Level of anxiety will decrease Outcome: Progressing   Problem: Elimination: Goal: Will not experience complications related to bowel motility Outcome: Progressing Goal: Will not experience complications related to urinary retention Outcome: Progressing   Problem: Pain Management: Goal: General experience of comfort will improve Outcome: Progressing   Problem: Safety: Goal: Ability to remain free from injury will improve Outcome: Progressing   Problem: Skin Integrity: Goal: Risk for impaired skin integrity will decrease Outcome: Progressing   Problem: Education: Goal: Ability to describe self-care measures that may prevent or decrease complications (Diabetes Survival Skills Education) will improve Outcome: Progressing Goal: Individualized Educational Video(s) Outcome: Progressing   Problem: Coping: Goal: Ability to adjust to condition or change in health will improve Outcome: Progressing   Problem: Fluid Volume: Goal: Ability to  maintain a balanced intake and output will improve Outcome: Progressing   Problem: Health Behavior/Discharge Planning: Goal: Ability to identify and utilize available resources and services will improve Outcome: Progressing Goal: Ability to manage health-related needs will improve Outcome: Progressing   Problem: Metabolic: Goal: Ability to maintain appropriate glucose levels will improve Outcome: Progressing   Problem: Nutritional: Goal: Maintenance of adequate nutrition will improve Outcome: Progressing Goal: Progress toward achieving an optimal weight will improve Outcome: Progressing   Problem: Skin Integrity: Goal: Risk for impaired skin integrity will decrease Outcome: Progressing   Problem: Tissue Perfusion: Goal: Adequacy of tissue perfusion will improve Outcome: Progressing   Problem: Safety: Goal: Non-violent Restraint(s) Outcome: Progressing   Problem: Education: Goal: Ability to identify signs and symptoms of gastrointestinal bleeding will improve Outcome: Progressing   Problem: Bowel/Gastric: Goal: Will show no signs and symptoms of gastrointestinal bleeding Outcome: Progressing   Problem: Fluid Volume: Goal: Will show no signs and symptoms of excessive bleeding Outcome: Progressing   Problem: Clinical Measurements: Goal: Complications related to the disease process, condition or treatment will be avoided or minimized Outcome: Progressing

## 2023-12-13 NOTE — Progress Notes (Signed)
Progress Note    [name] DUNSHEE Sr.  ZOX:[id number] DOB: [birth year]  DOA: 11/08/2023 PCP: Pcp, No      Brief Narrative:    Medical records reviewed and are as summarized below:  [name] Lance Sr. is a 63 y.o. male with PMH significant for ESRD-HD-MWF, DM2, HTN, nonischemic cardiomyopathy, mild systolic CHF, prior right BKA.  Patient presented to ED on 12/22 after experiencing an episode of chest pain, hypotension and near syncope during hemodialysis.  His initial blood pressure was low in 75/59 Troponin was significantly elevated and peaked at 9000, he was diagnosed with NSTEMI, started on heparin drip.  Patient was admitted for further workup.   Hospital course was also complicated altered mental status, influenza, GI bleeding, norovirus diarrhea. He has had a gradual recovery and has been recommended to go to SNF by PT. Has been awaiting ability to tolerate HD in a chair.  Received HD in a chair 1/24.     Assessment/Plan:   Principal Problem:   NSTEMI (non-ST elevated myocardial infarction) (HCC) Active Problems:   Sepsis with encephalopathy and septic shock (HCC)   ESRD on dialysis (HCC)   Hypotension   Acute on chronic systolic CHF (congestive heart failure) (HCC)   HTN (hypertension)   Acute metabolic encephalopathy   DM2 (diabetes mellitus, type 2) (HCC)   History of CVA    Protein-calorie malnutrition, mild (HCC)   Obesity (BMI 30-39.9)   Atrial flutter with rapid ventricular response (HCC)   Aortic valve stenosis   Heme positive stool   Acute gastroenteropathy due to Norovirus   Ischemic colitis (HCC)   Bloody diarrhea   Iron deficiency anemia   Nutrition Problem: Inadequate oral intake Etiology: acute illness  Signs/Symptoms: other (comment), energy intake < 75% for > 7 days (full liquid diet)   Body mass index is 29.38 kg/m.    NSTEMI-2D echo 11/09/2023 showed EF 30 -35% with regional WMA, moderate to severe aortic stenosis -Initially started  on medical management with IV heparin drip, aspirin and Lipitor -Initial consideration of cardiac cath, was placed on trial of DAPT.  However after single dose of Plavix with aspirin, patient had profuse rectal bleeding, hence DAPT was held.  Per cardiology, no further plan for inpatient ischemic evaluation.  This can be revisited as outpatient when GI issues stabilized. -Per cardiology, started on aspirin 81 mg daily, outpatient cardiac evaluation planned.  Continue baby aspirin and Lipitor    Acute GI bleeding- H&H is stable.   - continue protonix -CTA GI bleed 1/13 showed no active GI bleeding in the imaging  -Patient was offered EGD, colonoscopy, he declined. -Per GI, Dr. Leonides Schanz, capsule endoscopy completed, gastritis and duodenitis with small bowel angioectasia, no active bleeding  GI is okay with low-dose aspirin    Norovirus diarrhea- FOBT positive on 1/3, GI pathogen panel positive for norovirus. Symptoms have resolved.     Acute exacerbation of combined systolic and diastolic CHF Moderate to severe aortic stenosis -2D echo 12/23 showed EF 30 -35% with regional WMA, grade 3 DD, mildly reduced RV systolic function, moderate to severe aortic valve stenosis  -GDMT limited by ESRD and low BP.  Continue midodrine -Metoprolol held due to hypotension.   -HD for volume management     ESRD-HD-MWF Plan for hemodialysis tomorrow.  Went follow-up with nephrologist for hemodialysis. He completed hemodialysis in a chair on 12/11/2023    Influenza A positive -RVP positive for flu A + on 12/30.  Completed the  course of Tamiflu.    Acute metabolic encephalopathy, likely due to prolonged delirium -Patient met sepsis criteria on admission, POA due to tachycardia, fever, tachypnea, altered mental status -Patient hospital stay was complicated by altered mental status.  There was initial concern for encephalitis vs meningitis. Neurology was consulted.  Patient was placed on broad-spectrum  antibiotics -EEG 12/27 and 1/1 did not show any evidence of seizure activity. -MRI brain 1/3 negative for acute abnormality  -LP on 1/3,  CSF labs significant for elevated glucose and protein, not consistent with meningitis. Meningitis/encephalitis panel negative for pathogen. -ID and neurology signed off. Status has improved to baseline.  Sepsis physiology has resolved.    Type 2 diabetes mellitus uncontrolled -Hemoglobin A1c > 15.5 on 10/24/2023  -Continue sensitive SSI, Semglee 25 units daily, continue  NovoLog 2 units 3 times daily AC    H/o CVA Continue Lipitor     Awaiting placement to SNF   Diet Order             DIET SOFT Fluid consistency: Thin; Fluid restriction: 2000 mL Fluid  Diet effective now                            Consultants: Cardiologist Gastroenterologist Nephrologist  Procedures: Video capsule endoscopy    Medications:    aspirin EC  81 mg Oral Daily   atorvastatin  40 mg Oral Daily   Chlorhexidine Gluconate Cloth  6 each Topical Q0600   Chlorhexidine Gluconate Cloth  6 each Topical Q0600   cinacalcet  30 mg Oral Q breakfast   darbepoetin (ARANESP) injection - DIALYSIS  100 mcg Subcutaneous Q Mon-1800   doxercalciferol  3 mcg Intravenous Q M,W,F-HD   feeding supplement  237 mL Oral BID BM   heparin injection (subcutaneous)  5,000 Units Subcutaneous Q8H   insulin aspart  0-5 Units Subcutaneous QHS   insulin aspart  0-9 Units Subcutaneous TID WC   insulin aspart  2 Units Subcutaneous TID WC   insulin glargine-yfgn  25 Units Subcutaneous Daily   midodrine  10 mg Oral TID WC   multivitamin  1 tablet Oral QHS   pantoprazole  40 mg Oral BID   sevelamer carbonate  4,000 mg Oral TID WC   thiamine  100 mg Oral Daily   Continuous Infusions:   Anti-infectives (From admission, onward)    Start     Dose/Rate Route Frequency Ordered Stop   11/18/23 1800  oseltamivir (TAMIFLU) capsule 30 mg        30 mg Oral Every M-W-F  (Hemodialysis) 11/18/23 1116 11/23/23 1159   11/17/23 1800  oseltamivir (TAMIFLU) capsule 30 mg  Status:  Discontinued        30 mg Oral Every T-Th-Sa (1800) 11/17/23 0800 11/18/23 1116   11/17/23 1115  vancomycin (VANCOCIN) IVPB 1000 mg/200 mL premix  Status:  Discontinued        1,000 mg 200 mL/hr over 60 Minutes Intravenous Once in dialysis 11/17/23 1021 11/18/23 1331   11/17/23 1000  oseltamivir (TAMIFLU) capsule 30 mg        30 mg Oral  Once 11/17/23 0759 11/17/23 0952   11/16/23 1200  vancomycin (VANCOCIN) IVPB 1000 mg/200 mL premix  Status:  Discontinued        1,000 mg 200 mL/hr over 60 Minutes Intravenous Every M-W-F (Hemodialysis) 11/15/23 1658 11/18/23 1306   11/12/23 2300  vancomycin (VANCOCIN) IVPB 1000 mg/200 mL premix  1,000 mg 200 mL/hr over 60 Minutes Intravenous  Once 11/12/23 2226 11/13/23 0131   11/12/23 2000  cefTRIAXone (ROCEPHIN) 2 g in sodium chloride 0.9 % 100 mL IVPB  Status:  Discontinued        2 g 200 mL/hr over 30 Minutes Intravenous Every 12 hours 11/12/23 1627 11/18/23 1306   11/12/23 2000  ampicillin (OMNIPEN) 2 g in sodium chloride 0.9 % 100 mL IVPB  Status:  Discontinued        2 g 300 mL/hr over 20 Minutes Intravenous Every 12 hours 11/12/23 1627 11/18/23 1306   11/12/23 1730  acyclovir (ZOVIRAX) 450 mg in dextrose 5 % 100 mL IVPB  Status:  Discontinued        5 mg/kg  89.6 kg (Adjusted) 109 mL/hr over 60 Minutes Intravenous Every 24 hours 11/12/23 1627 11/18/23 1306   11/11/23 1915  piperacillin-tazobactam (ZOSYN) IVPB 2.25 g  Status:  Discontinued        2.25 g 100 mL/hr over 30 Minutes Intravenous Every 8 hours 11/11/23 1825 11/12/23 1615   11/11/23 1245  metroNIDAZOLE (FLAGYL) IVPB 500 mg  Status:  Discontinued        500 mg 100 mL/hr over 60 Minutes Intravenous Every 12 hours 11/11/23 1146 11/11/23 1825   11/11/23 1200  vancomycin (VANCOCIN) IVPB 1000 mg/200 mL premix  Status:  Discontinued        1,000 mg 200 mL/hr over 60 Minutes  Intravenous Every M-W-F (Hemodialysis) 11/10/23 1458 11/15/23 1658   11/10/23 1545  vancomycin (VANCOCIN) IVPB 1000 mg/200 mL premix       Placed in "Followed by" Linked Group   1,000 mg 200 mL/hr over 60 Minutes Intravenous  Once 11/10/23 1458 11/10/23 1917   11/10/23 1545  vancomycin (VANCOCIN) IVPB 1000 mg/200 mL premix       Placed in "Followed by" Linked Group   1,000 mg 200 mL/hr over 60 Minutes Intravenous  Once 11/10/23 1458 11/10/23 1815   11/10/23 1545  ceFEPIme (MAXIPIME) 1 g in sodium chloride 0.9 % 100 mL IVPB  Status:  Discontinued        1 g 200 mL/hr over 30 Minutes Intravenous Every M-W-F 11/10/23 1458 11/10/23 1520   11/10/23 1545  ceFEPIme (MAXIPIME) 2 g in sodium chloride 0.9 % 100 mL IVPB  Status:  Discontinued        2 g 200 mL/hr over 30 Minutes Intravenous Every M-W-F 11/10/23 1520 11/11/23 1810              Family Communication/Anticipated D/C date and plan/Code Status   DVT prophylaxis: heparin injection 5,000 Units Start: 12/04/23 1445 Place and maintain sequential compression device Start: 11/20/23 0706 SCDs Start: 11/09/23 1558     Code Status: Full Code  Family Communication: None Disposition Plan: Plan to discharge to SNF   Status is: Inpatient Remains inpatient appropriate because: Awaiting placement to SNF       Subjective:   Interval events noted.  No acute events overnight.  He has no complaints.  Objective:    Vitals:   12/13/23 0552 12/13/23 0802 12/13/23 1125 12/13/23 1126  BP: (!) 142/82 131/72  117/68  Pulse: 83 85 80 81  Resp: 19 (!) 25 19 19   Temp: 97.9 F (36.6 C) 98.9 F (37.2 C)  98.7 F (37.1 C)  TempSrc: Oral Oral  Oral  SpO2: 98% 97% 97% 97%  Weight: 101 kg     Height:       No data found.  Intake/Output Summary (Last 24 hours) at 12/13/2023 1510 Last data filed at 12/13/2023 0830 Gross per 24 hour  Intake 360 ml  Output --  Net 360 ml   Filed Weights   12/11/23 0516 12/12/23 0535 12/13/23  0552  Weight: 101.9 kg 99.9 kg 101 kg    Exam:   GEN: NAD SKIN: Warm and dry EYES: No pallor or icterus ENT: MMM CV: RRR PULM: CTA B ABD: soft, ND, NT, +BS CNS: AAO x 3, non focal EXT: Right BKA.  No edema or tenderness      Data Reviewed:   I have personally reviewed following labs and imaging studies:  Labs: Labs show the following:   Basic Metabolic Panel: Recent Labs  Lab 12/08/23 0252 12/09/23 0253 12/10/23 0301 12/11/23 0256 12/13/23 1210  NA 133* 130* 134* 136 136  K 5.4* 5.4* 4.5 5.0 4.5  CL 95* 93* 96* 98 95*  CO2 26 24 27 26 27   GLUCOSE 235* 240* 147* 217* 168*  BUN 24* 30* 18 24* 29*  CREATININE 8.06* 9.60* 7.02* 8.80* 9.03*  CALCIUM 9.2 9.6 9.5 9.8 9.6  PHOS  --   --   --   --  3.9   GFR Estimated Creatinine Clearance: 10.7 mL/min (A) (by C-G formula based on SCr of 9.03 mg/dL (H)). Liver Function Tests: Recent Labs  Lab 12/13/23 1210  ALBUMIN 3.0*   No results for input(s): "LIPASE", "AMYLASE" in the last 168 hours. No results for input(s): "AMMONIA" in the last 168 hours. Coagulation profile No results for input(s): "INR", "PROTIME" in the last 168 hours.  CBC: Recent Labs  Lab 12/07/23 0925 12/08/23 0252 12/09/23 0253 12/10/23 0301 12/11/23 0832  WBC 7.7 7.2 6.8 6.3 7.0  HGB 9.4* 9.7* 9.8* 9.9* 9.8*  HCT 29.8* 31.2* 31.2* 32.2* 32.0*  MCV 93.4 94.0 93.4 94.4 94.4  PLT 245 235 239 240 249   Cardiac Enzymes: No results for input(s): "CKTOTAL", "CKMB", "CKMBINDEX", "TROPONINI" in the last 168 hours. BNP (last 3 results) No results for input(s): "PROBNP" in the last 8760 hours. CBG: Recent Labs  Lab 12/12/23 1200 12/12/23 1539 12/12/23 2059 12/13/23 0555 12/13/23 1125  GLUCAP 93 140* 140* 150* 173*   D-Dimer: No results for input(s): "DDIMER" in the last 72 hours. Hgb A1c: No results for input(s): "HGBA1C" in the last 72 hours. Lipid Profile: No results for input(s): "CHOL", "HDL", "LDLCALC", "TRIG", "CHOLHDL",  "LDLDIRECT" in the last 72 hours. Thyroid function studies: No results for input(s): "TSH", "T4TOTAL", "T3FREE", "THYROIDAB" in the last 72 hours.  Invalid input(s): "FREET3" Anemia work up: No results for input(s): "VITAMINB12", "FOLATE", "FERRITIN", "TIBC", "IRON", "RETICCTPCT" in the last 72 hours. Sepsis Labs: Recent Labs  Lab 12/08/23 0252 12/09/23 0253 12/10/23 0301 12/11/23 0832  WBC 7.2 6.8 6.3 7.0    Microbiology No results found for this or any previous visit (from the past 240 hours).  Procedures and diagnostic studies:  No results found.             LOS: 35 days   Kennen Stammer  [employer] loss adjuster on [email or web address]. If 7PM-7AM, please contact night-coverage at [email or web address]     12/13/2023, 3:10 PM

## 2023-12-13 NOTE — Progress Notes (Signed)
[facility] Progress Note   Subjective:   Pt reports he feels fine, just waiting for SNF. Denies SOB, CP, dizziness, nausea.   Objective Vitals:   12/12/23 1946 12/13/23 0055 12/13/23 0552 12/13/23 0802  BP: 125/66 124/74 (!) 142/82 131/72  Pulse: 79 76 83 85  Resp: 20 (!) 23 19 (!) 25  Temp: 98.7 F (37.1 C) 98.3 F (36.8 C) 97.9 F (36.6 C) 98.9 F (37.2 C)  TempSrc: Oral Oral Oral Oral  SpO2: 98% 98% 98% 97%  Weight:   101 kg   Height:       Physical Exam General: Alert male in NAD Heart: RRR, 3/6 systolic murmur Lungs: CTA bilaterally, respirations unlabored on RA Abdomen: Soft, non-distended, +BS Extremities: no edema b/l lower extremities Dialysis Access: AVF + t/b  Additional Objective Labs: Basic Metabolic Panel: Recent Labs  Lab 12/09/23 0253 12/10/23 0301 12/11/23 0256  NA 130* 134* 136  K 5.4* 4.5 5.0  CL 93* 96* 98  CO2 24 27 26   GLUCOSE 240* 147* 217*  BUN 30* 18 24*  CREATININE 9.60* 7.02* 8.80*  CALCIUM 9.6 9.5 9.8   Liver Function Tests: No results for input(s): "AST", "ALT", "ALKPHOS", "BILITOT", "PROT", "ALBUMIN" in the last 168 hours. No results for input(s): "LIPASE", "AMYLASE" in the last 168 hours. CBC: Recent Labs  Lab 12/07/23 0925 12/08/23 0252 12/09/23 0253 12/10/23 0301 12/11/23 0832  WBC 7.7 7.2 6.8 6.3 7.0  HGB 9.4* 9.7* 9.8* 9.9* 9.8*  HCT 29.8* 31.2* 31.2* 32.2* 32.0*  MCV 93.4 94.0 93.4 94.4 94.4  PLT 245 235 239 240 249   Blood Culture    Component Value Date/Time   SDES CSF 11/19/2023 1134   SPECREQUEST NONE 11/19/2023 1134   CULT  11/19/2023 1134    NO GROWTH 3 DAYS Performed at [facility], [address], [place], [place] [address]    REPTSTATUS 11/23/2023 FINAL 11/19/2023 1134    Cardiac Enzymes: No results for input(s): "CKTOTAL", "CKMB", "CKMBINDEX", "TROPONINI" in the last 168 hours. CBG: Recent Labs  Lab 12/12/23 0755 12/12/23 1200 12/12/23 1539 12/12/23 2059  12/13/23 0555  GLUCAP 132* 93 140* 140* 150*   Iron Studies: No results for input(s): "IRON", "TIBC", "TRANSFERRIN", "FERRITIN" in the last 72 hours. @lablastinr3 @ Studies/Results: No results found. Medications:   aspirin EC  81 mg Oral Daily   atorvastatin  40 mg Oral Daily   Chlorhexidine Gluconate Cloth  6 each Topical Q0600   cinacalcet  30 mg Oral Q breakfast   darbepoetin (ARANESP) injection - DIALYSIS  100 mcg Subcutaneous Q Mon-1800   doxercalciferol  3 mcg Intravenous Q M,W,F-HD   feeding supplement  237 mL Oral BID BM   heparin injection (subcutaneous)  5,000 Units Subcutaneous Q8H   insulin aspart  0-5 Units Subcutaneous QHS   insulin aspart  0-9 Units Subcutaneous TID WC   insulin aspart  2 Units Subcutaneous TID WC   insulin glargine-yfgn  25 Units Subcutaneous Daily   midodrine  10 mg Oral TID WC   multivitamin  1 tablet Oral QHS   pantoprazole  40 mg Oral BID   sevelamer carbonate  4,000 mg Oral TID WC   sodium zirconium cyclosilicate  10 g Oral Daily   thiamine  100 mg Oral Daily    Dialysis Orders: [facility] MWF 4h  450/700  105kg   2/2 bath   AVF   Heparin 7000 + 2000 midrun - Hectorol 7 mcg IV three  times per week  Assessment/Plan: 1.AMS: Was on cefepime which has been stopped. Per primary team some concern for encephalitis/meningitis, was on empiric antibiotics/antivirals until seen by ID on 11/18/23 and all was stopped.  S/P LP which ruled out bacterail meningitis/encephalitis. He did test + for flu A by resp panel. MRI without acute abnormality.  ID stopped abx and antivirals and signed off. Mental status has improved 2.  ESRD - usual HD is MWF. Next HD Monday 3. Hyperkalemia - elevated K+ previously, required lokelma. Improved.  4. NSTEMI- work up per cardiology/primary. No plan for invasive procedure and not DAPT candidate per cardiology.  5. Volume - No significant volume overload on exam. Will need lower dry weight on d/c.  6.  Fever/Hypotension- BC with NGTD. Was started on board spectrum antibiotics/antivirals but now discontinued.  ID signed off. BP improved. 7. HFrEF/history of severe AS- attempting to optimize volume with dialysis. ECHO  EF 30 to 35%. Seen by Cards - medical management of AS. Fluid restrictions and daily weights. On midodrine 10 mg 3 times daily.  8. Anemia  - Hgb 9s.  Now stable. S/p 1 unit prbc on 11/27/23.   Start Fe load 1/14.Aranesp 100 q Mon -last 1/20.  9. Metabolic bone disease -Continue renvela. VDRA on hold d/t high calcium; coming down. 10. Debility - memory is better however he remains debilitated. Working with PT  11. GIB/Norovirus - C diff neg.  Completed capsule endoscopy. No active bleeding.       Rogers Blocker, PA-C 12/13/2023, 9:16 AM  [facility] Pager: [phone]

## 2023-12-14 LAB — RENAL FUNCTION PANEL
Albumin: 2.9 g/dL — ABNORMAL LOW (ref 3.5–5.0)
Anion gap: 13 (ref 5–15)
BUN: 34 mg/dL — ABNORMAL HIGH (ref 8–23)
CO2: 25 mmol/L (ref 22–32)
Calcium: 9.4 mg/dL (ref 8.9–10.3)
Chloride: 97 mmol/L — ABNORMAL LOW (ref 98–111)
Creatinine, Ser: 10.57 mg/dL — ABNORMAL HIGH (ref 0.61–1.24)
GFR, Estimated: 5 mL/min — ABNORMAL LOW (ref >60)
Glucose, Bld: 124 mg/dL — ABNORMAL HIGH (ref 70–99)
Phosphorus: 4.3 mg/dL (ref 2.5–4.6)
Potassium: 4.4 mmol/L (ref 3.5–5.1)
Sodium: 135 mmol/L (ref 135–145)

## 2023-12-14 LAB — GLUCOSE, CAPILLARY
Glucose-Capillary: 149 mg/dL — ABNORMAL HIGH (ref 70–99)
Glucose-Capillary: 133 mg/dL — ABNORMAL HIGH (ref 70–99)
Glucose-Capillary: 115 mg/dL — ABNORMAL HIGH (ref 70–99)
Glucose-Capillary: 116 mg/dL — ABNORMAL HIGH (ref 70–99)

## 2023-12-14 LAB — CBC
HCT: 32.7 % — ABNORMAL LOW (ref 39.0–52.0)
Hemoglobin: 10.1 g/dL — ABNORMAL LOW (ref 13.0–17.0)
MCH: 28.9 pg (ref 26.0–34.0)
MCHC: 30.9 g/dL (ref 30.0–36.0)
MCV: 93.4 fL (ref 80.0–100.0)
Platelets: 214 10*3/uL (ref 150–400)
RBC: 3.5 MIL/uL — ABNORMAL LOW (ref 4.22–5.81)
RDW: 19 % — ABNORMAL HIGH (ref 11.5–15.5)
WBC: 8.4 10*3/uL (ref 4.0–10.5)
nRBC: 0 % (ref 0.0–0.2)

## 2023-12-14 MED ORDER — HEPARIN SODIUM (PORCINE) 1000 UNIT/ML DIALYSIS
5000.0000 [IU] | Freq: Once | INTRAMUSCULAR | Status: AC
  Administered 2023-12-14: 5000 [IU] via INTRAVENOUS_CENTRAL
  Filled 2023-12-14: qty 5

## 2023-12-14 NOTE — Progress Notes (Signed)
   12/14/23 1200  Vitals  Temp 98.7 F (37.1 C)  Pulse Rate 82  Resp 20  BP 108/62  SpO2 97 %  O2 Device Room Air  Oxygen Therapy  Pulse Oximetry Type Continuous   Received patient in bed to unit.  Alert and oriented.  Informed consent signed and in chart.   TX duration:3.5  Patient tolerated well.  Transported back to the room  Alert, without acute distress.  Hand-off given to patient's nurse.   Access used: LAVF Access issues: None  Total UF removed: Medication(s) given: Seen MAR    Laqueta Due, RN [facility]

## 2023-12-14 NOTE — Progress Notes (Signed)
[facility] Progress Note   Subjective:   Patient feels overall better.  No complaints  Objective Vitals:   12/14/23 0820 12/14/23 0830 12/14/23 0900 12/14/23 0930  BP: (!) 117/55 127/65 123/65 (!) 120/57  Pulse: 84 82 (!) 39 (!) 39  Resp: (!) 26 (!) 30 19 (!) 21  Temp:      TempSrc:      SpO2: 92% 99% 99% 95%  Weight:      Height:       Physical Exam General: Alert male in NAD Heart: Normal rate, no rub Lungs: Bilateral chest rise with no increased work of breathing Abdomen: Soft, non-distended, +BS Extremities: no edema b/l lower extremities Dialysis Access: AVF + t/b  Additional Objective Labs: Basic Metabolic Panel: Recent Labs  Lab 12/11/23 0256 12/13/23 1210 12/14/23 0739  NA 136 136 135  K 5.0 4.5 4.4  CL 98 95* 97*  CO2 26 27 25   GLUCOSE 217* 168* 124*  BUN 24* 29* 34*  CREATININE 8.80* 9.03* 10.57*  CALCIUM 9.8 9.6 9.4  PHOS  --  3.9 4.3   Liver Function Tests: Recent Labs  Lab 12/13/23 1210 12/14/23 0739  ALBUMIN 3.0* 2.9*   No results for input(s): "LIPASE", "AMYLASE" in the last 168 hours. CBC: Recent Labs  Lab 12/08/23 0252 12/09/23 0253 12/10/23 0301 12/11/23 0832 12/14/23 0739  WBC 7.2 6.8 6.3 7.0 8.4  HGB 9.7* 9.8* 9.9* 9.8* 10.1*  HCT 31.2* 31.2* 32.2* 32.0* 32.7*  MCV 94.0 93.4 94.4 94.4 93.4  PLT 235 239 240 249 214   Blood Culture    Component Value Date/Time   SDES CSF 11/19/2023 1134   SPECREQUEST NONE 11/19/2023 1134   CULT  11/19/2023 1134    NO GROWTH 3 DAYS Performed at [facility], [address] [place], [place] [address]    REPTSTATUS 11/23/2023 FINAL 11/19/2023 1134    Cardiac Enzymes: No results for input(s): "CKTOTAL", "CKMB", "CKMBINDEX", "TROPONINI" in the last 168 hours. CBG: Recent Labs  Lab 12/13/23 0555 12/13/23 1125 12/13/23 1516 12/13/23 2111 12/14/23 0606  GLUCAP 150* 173* 101* 142* 149*   Iron Studies: No results for input(s): "IRON", "TIBC", "TRANSFERRIN",  "FERRITIN" in the last 72 hours. @lablastinr3 @ Studies/Results: No results found. Medications:   aspirin EC  81 mg Oral Daily   atorvastatin  40 mg Oral Daily   Chlorhexidine Gluconate Cloth  6 each Topical Q0600   Chlorhexidine Gluconate Cloth  6 each Topical Q0600   cinacalcet  30 mg Oral Q breakfast   darbepoetin (ARANESP) injection - DIALYSIS  100 mcg Subcutaneous Q Mon-1800   doxercalciferol  3 mcg Intravenous Q M,W,F-HD   feeding supplement  237 mL Oral BID BM   heparin injection (subcutaneous)  5,000 Units Subcutaneous Q8H   insulin aspart  0-5 Units Subcutaneous QHS   insulin aspart  0-9 Units Subcutaneous TID WC   insulin aspart  2 Units Subcutaneous TID WC   insulin glargine-yfgn  25 Units Subcutaneous Daily   midodrine  10 mg Oral TID WC   multivitamin  1 tablet Oral QHS   pantoprazole  40 mg Oral BID   sevelamer carbonate  4,000 mg Oral TID WC   thiamine  100 mg Oral Daily    Dialysis Orders: [facility] MWF 4h  450/700  105kg   2/2 bath   AVF   Heparin 7000 + 2000 midrun - Hectorol 7 mcg IV three times per week  Assessment/Plan: 1.AMS: Was on  cefepime which has been stopped. Per primary team some concern for encephalitis/meningitis, was on empiric antibiotics/antivirals until seen by ID on 11/18/23 and all was stopped.  S/P LP which ruled out bacterail meningitis/encephalitis. He did test + for flu A by resp panel. MRI without acute abnormality.  ID stopped abx and antivirals and signed off. Mental status has improved 2.  ESRD - usual HD is MWF.  Continue per schedule 3. Hyperkalemia -resolved medical treatment 4. NSTEMI- work up per cardiology/primary. No plan for invasive procedure and not DAPT candidate per cardiology.  5. Volume - No significant volume overload on exam. Will need lower dry weight on d/c.  6. Fever/Hypotension- BC with NGTD. Was started on board spectrum antibiotics/antivirals but now discontinued.  ID signed off. BP improved. 7.  HFrEF/history of severe AS- attempting to optimize volume with dialysis. ECHO  EF 30 to 35%. Seen by Cards - medical management of AS. Fluid restrictions and daily weights. On midodrine 10 mg 3 times daily.  8. Anemia  - Hgb 9s.  Now stable. S/p 1 unit prbc on 11/27/23.   Start Fe load 1/14.Aranesp 100 q Mon -last 1/27 9. Metabolic bone disease -Continue renvela. VDRA on hold d/t high calcium; coming down. 10. Debility - memory is better however he remains debilitated. Working with PT  11. GIB/Norovirus - C diff neg.  Completed capsule endoscopy. No active bleeding.

## 2023-12-14 NOTE — Progress Notes (Signed)
Contacted [facility] this morning. Navigator advised that pt's MWF 11:30 am chair time is still available if pt is d/c to snf closer to clinic. Pt needs to arrive at 11:15 am. This info was provided to CSW for d/c planning purposes. Contacted [facility] to be advised that pt will not be starting today. Will assist as needed.   Olivia Canter Renal Navigator [phone]

## 2023-12-14 NOTE — Progress Notes (Signed)
PROGRESS NOTE    [name] Lance Sr.  ZOX:[id number] DOB: [birth year] DOA: 11/08/2023 PCP: Pcp, No   Brief Narrative:  [name] Lance Sr. is a 63 y.o. male with PMH significant for ESRD-HD-MWF, DM2, HTN, nonischemic cardiomyopathy, mild systolic CHF, prior right BKA.  Patient presented to ED on 12/22 after experiencing an episode of chest pain, hypotension and near syncope during hemodialysis.  His initial blood pressure was low in 75/59 Troponin was significantly elevated and peaked at 9000, he was diagnosed with NSTEMI, started on heparin drip.  Patient was admitted for further workup.   Hospital course was also complicated altered mental status, influenza, GI bleeding, norovirus diarrhea. He has had a gradual recovery and has been recommended to go to SNF by PT. Has been awaiting ability to tolerate HD in a chair.  Received HD in a chair 1/24.     Assessment & Plan:   Principal Problem:   NSTEMI (non-ST elevated myocardial infarction) (HCC) Active Problems:   Sepsis with encephalopathy and septic shock (HCC)   ESRD on dialysis (HCC)   Hypotension   Acute on chronic systolic CHF (congestive heart failure) (HCC)   HTN (hypertension)   Acute metabolic encephalopathy   DM2 (diabetes mellitus, type 2) (HCC)   History of CVA    Protein-calorie malnutrition, mild (HCC)   Obesity (BMI 30-39.9)   Atrial flutter with rapid ventricular response (HCC)   Aortic valve stenosis   Heme positive stool   Acute gastroenteropathy due to Norovirus   Ischemic colitis (HCC)   Bloody diarrhea   Iron deficiency anemia  NSTEMI-2D echo 11/09/2023 showed EF 30 -35% with regional WMA, moderate to severe aortic stenosis -Initially started on medical management with IV heparin drip, aspirin and Lipitor -Initial consideration of cardiac cath, was placed on trial of DAPT.  However after single dose of Plavix with aspirin, patient had profuse rectal bleeding, hence DAPT was held.  Per cardiology, no further  plan for inpatient ischemic evaluation.  This can be revisited as outpatient when GI issues stabilized. -Per cardiology, started on aspirin 81 mg daily, outpatient cardiac evaluation planned.  Continue baby aspirin and Lipitor     Acute GI bleeding- H&H is stable.   - continue protonix -CTA GI bleed 1/13 showed no active GI bleeding in the imaging  -Patient was offered EGD, colonoscopy, he declined. -Per GI, Dr. Leonides Schanz, capsule endoscopy completed, gastritis and duodenitis with small bowel angioectasia, no active bleeding  GI is okay with low-dose aspirin     Norovirus diarrhea- FOBT positive on 1/3, GI pathogen panel positive for norovirus. Symptoms have resolved.      Acute exacerbation of combined systolic and diastolic CHF Moderate to severe aortic stenosis -2D echo 12/23 showed EF 30 -35% with regional WMA, grade 3 DD, mildly reduced RV systolic function, moderate to severe aortic valve stenosis  -GDMT limited by ESRD and low BP.  Continue midodrine -Metoprolol held due to hypotension.   -HD for volume management      ESRD-HD-MWF Plan for hemodialysis tomorrow.  Went follow-up with nephrologist for hemodialysis. He completed hemodialysis in a chair on 12/11/2023     Influenza A positive -RVP positive for flu A + on 12/30.  Completed the course of Tamiflu.     Acute metabolic encephalopathy, likely due to prolonged delirium -Patient met sepsis criteria on admission, POA due to tachycardia, fever, tachypnea, altered mental status -Patient hospital stay was complicated by altered mental status.  There was initial concern for encephalitis  vs meningitis. Neurology was consulted.  Patient was placed on broad-spectrum antibiotics -EEG 12/27 and 1/1 did not show any evidence of seizure activity. -MRI brain 1/3 negative for acute abnormality  -LP on 1/3,  CSF labs significant for elevated glucose and protein, not consistent with meningitis. Meningitis/encephalitis panel negative for  pathogen. -ID and neurology signed off. Status has improved to baseline.  Sepsis physiology has resolved.     Type 2 diabetes mellitus uncontrolled -Hemoglobin A1c > 15.5 on 10/24/2023  -Continue sensitive SSI, Semglee 25 units daily, continue  NovoLog 2 units 3 times daily AC     H/o CVA Continue Lipitor  DVT prophylaxis: heparin injection 5,000 Units Start: 12/04/23 1445 Place and maintain sequential compression device Start: 11/20/23 0706 SCDs Start: 11/09/23 1558   Code Status: Full Code  Family Communication:  None present at bedside.  Plan of care discussed with patient in length and he/she verbalized understanding and agreed with it.  Status is: Inpatient Remains inpatient appropriate because: Patient medically stable, pending placement.   Estimated body mass index is 29.58 kg/m as calculated from the following:   Height as of this encounter: 6\' 1"  (1.854 m).   Weight as of this encounter: 101.7 kg.    Nutritional Assessment: Body mass index is 29.58 kg/m.Marland Kitchen Seen by dietician.  I agree with the assessment and plan as outlined below: Nutrition Status: Nutrition Problem: Inadequate oral intake Etiology: acute illness Signs/Symptoms: other (comment), energy intake < 75% for > 7 days (full liquid diet) Interventions: Ensure Enlive (each supplement provides 350kcal and 20 grams of protein), Magic cup, MVI  . Skin Assessment: I have examined the patient's skin and I agree with the wound assessment as performed by the wound care RN as outlined below:    Consultants:  Palliative care, GI, infectious disease, neurology  Procedures:  As above  Antimicrobials:  Anti-infectives (From admission, onward)    Start     Dose/Rate Route Frequency Ordered Stop   11/18/23 1800  oseltamivir (TAMIFLU) capsule 30 mg        30 mg Oral Every M-W-F (Hemodialysis) 11/18/23 1116 11/23/23 1159   11/17/23 1800  oseltamivir (TAMIFLU) capsule 30 mg  Status:  Discontinued        30 mg  Oral Every T-Th-Sa (1800) 11/17/23 0800 11/18/23 1116   11/17/23 1115  vancomycin (VANCOCIN) IVPB 1000 mg/200 mL premix  Status:  Discontinued        1,000 mg 200 mL/hr over 60 Minutes Intravenous Once in dialysis 11/17/23 1021 11/18/23 1331   11/17/23 1000  oseltamivir (TAMIFLU) capsule 30 mg        30 mg Oral  Once 11/17/23 0759 11/17/23 0952   11/16/23 1200  vancomycin (VANCOCIN) IVPB 1000 mg/200 mL premix  Status:  Discontinued        1,000 mg 200 mL/hr over 60 Minutes Intravenous Every M-W-F (Hemodialysis) 11/15/23 1658 11/18/23 1306   11/12/23 2300  vancomycin (VANCOCIN) IVPB 1000 mg/200 mL premix        1,000 mg 200 mL/hr over 60 Minutes Intravenous  Once 11/12/23 2226 11/13/23 0131   11/12/23 2000  cefTRIAXone (ROCEPHIN) 2 g in sodium chloride 0.9 % 100 mL IVPB  Status:  Discontinued        2 g 200 mL/hr over 30 Minutes Intravenous Every 12 hours 11/12/23 1627 11/18/23 1306   11/12/23 2000  ampicillin (OMNIPEN) 2 g in sodium chloride 0.9 % 100 mL IVPB  Status:  Discontinued  2 g 300 mL/hr over 20 Minutes Intravenous Every 12 hours 11/12/23 1627 11/18/23 1306   11/12/23 1730  acyclovir (ZOVIRAX) 450 mg in dextrose 5 % 100 mL IVPB  Status:  Discontinued        5 mg/kg  89.6 kg (Adjusted) 109 mL/hr over 60 Minutes Intravenous Every 24 hours 11/12/23 1627 11/18/23 1306   11/11/23 1915  piperacillin-tazobactam (ZOSYN) IVPB 2.25 g  Status:  Discontinued        2.25 g 100 mL/hr over 30 Minutes Intravenous Every 8 hours 11/11/23 1825 11/12/23 1615   11/11/23 1245  metroNIDAZOLE (FLAGYL) IVPB 500 mg  Status:  Discontinued        500 mg 100 mL/hr over 60 Minutes Intravenous Every 12 hours 11/11/23 1146 11/11/23 1825   11/11/23 1200  vancomycin (VANCOCIN) IVPB 1000 mg/200 mL premix  Status:  Discontinued        1,000 mg 200 mL/hr over 60 Minutes Intravenous Every M-W-F (Hemodialysis) 11/10/23 1458 11/15/23 1658   11/10/23 1545  vancomycin (VANCOCIN) IVPB 1000 mg/200 mL premix        Placed in "Followed by" Linked Group   1,000 mg 200 mL/hr over 60 Minutes Intravenous  Once 11/10/23 1458 11/10/23 1917   11/10/23 1545  vancomycin (VANCOCIN) IVPB 1000 mg/200 mL premix       Placed in "Followed by" Linked Group   1,000 mg 200 mL/hr over 60 Minutes Intravenous  Once 11/10/23 1458 11/10/23 1815   11/10/23 1545  ceFEPIme (MAXIPIME) 1 g in sodium chloride 0.9 % 100 mL IVPB  Status:  Discontinued        1 g 200 mL/hr over 30 Minutes Intravenous Every M-W-F 11/10/23 1458 11/10/23 1520   11/10/23 1545  ceFEPIme (MAXIPIME) 2 g in sodium chloride 0.9 % 100 mL IVPB  Status:  Discontinued        2 g 200 mL/hr over 30 Minutes Intravenous Every M-W-F 11/10/23 1520 11/11/23 1810         Subjective: Patient seen and examined in the dialysis unit, he has no complaints.  Objective: Vitals:   12/14/23 1115 12/14/23 1130 12/14/23 1156 12/14/23 1200  BP: 101/62 (!) 114/53 104/66 108/62  Pulse: 81 81 83 82  Resp:   18 20  Temp:    98.7 F (37.1 C)  TempSrc:      SpO2: 93% 98% 96% 97%  Weight:      Height:        Intake/Output Summary (Last 24 hours) at 12/14/2023 1357 Last data filed at 12/14/2023 1200 Gross per 24 hour  Intake 360 ml  Output 3000 ml  Net -2640 ml   Filed Weights   12/13/23 0552 12/14/23 0604 12/14/23 0806  Weight: 101 kg 101.8 kg 101.7 kg    Examination:  General exam: Appears calm and comfortable  Respiratory system: Clear to auscultation. Respiratory effort normal. Cardiovascular system: S1 & S2 heard, RRR. No JVD, murmurs, rubs, gallops or clicks. No pedal edema. Gastrointestinal system: Abdomen is nondistended, soft and nontender. No organomegaly or masses felt. Normal bowel sounds heard. Central nervous system: Alert and oriented. No focal neurological deficits. Extremities: Right BKA Psychiatry: Judgement and insight appear normal. Mood & affect appropriate.    Data Reviewed: I have personally reviewed following labs and imaging  studies  CBC: Recent Labs  Lab 12/08/23 0252 12/09/23 0253 12/10/23 0301 12/11/23 0832 12/14/23 0739  WBC 7.2 6.8 6.3 7.0 8.4  HGB 9.7* 9.8* 9.9* 9.8* 10.1*  HCT 31.2*  31.2* 32.2* 32.0* 32.7*  MCV 94.0 93.4 94.4 94.4 93.4  PLT 235 239 240 249 214   Basic Metabolic Panel: Recent Labs  Lab 12/09/23 0253 12/10/23 0301 12/11/23 0256 12/13/23 1210 12/14/23 0739  NA 130* 134* 136 136 135  K 5.4* 4.5 5.0 4.5 4.4  CL 93* 96* 98 95* 97*  CO2 24 27 26 27 25   GLUCOSE 240* 147* 217* 168* 124*  BUN 30* 18 24* 29* 34*  CREATININE 9.60* 7.02* 8.80* 9.03* 10.57*  CALCIUM 9.6 9.5 9.8 9.6 9.4  PHOS  --   --   --  3.9 4.3   GFR: Estimated Creatinine Clearance: 9.2 mL/min (A) (by C-G formula based on SCr of 10.57 mg/dL (H)). Liver Function Tests: Recent Labs  Lab 12/13/23 1210 12/14/23 0739  ALBUMIN 3.0* 2.9*   No results for input(s): "LIPASE", "AMYLASE" in the last 168 hours. No results for input(s): "AMMONIA" in the last 168 hours. Coagulation Profile: No results for input(s): "INR", "PROTIME" in the last 168 hours. Cardiac Enzymes: No results for input(s): "CKTOTAL", "CKMB", "CKMBINDEX", "TROPONINI" in the last 168 hours. BNP (last 3 results) No results for input(s): "PROBNP" in the last 8760 hours. HbA1C: No results for input(s): "HGBA1C" in the last 72 hours. CBG: Recent Labs  Lab 12/13/23 0555 12/13/23 1125 12/13/23 1516 12/13/23 2111 12/14/23 0606  GLUCAP 150* 173* 101* 142* 149*   Lipid Profile: No results for input(s): "CHOL", "HDL", "LDLCALC", "TRIG", "CHOLHDL", "LDLDIRECT" in the last 72 hours. Thyroid Function Tests: No results for input(s): "TSH", "T4TOTAL", "FREET4", "T3FREE", "THYROIDAB" in the last 72 hours. Anemia Panel: No results for input(s): "VITAMINB12", "FOLATE", "FERRITIN", "TIBC", "IRON", "RETICCTPCT" in the last 72 hours. Sepsis Labs: No results for input(s): "PROCALCITON", "LATICACIDVEN" in the last 168 hours.  No results found for this  or any previous visit (from the past 240 hours).   Radiology Studies: No results found.  Scheduled Meds:  aspirin EC  81 mg Oral Daily   atorvastatin  40 mg Oral Daily   Chlorhexidine Gluconate Cloth  6 each Topical Q0600   Chlorhexidine Gluconate Cloth  6 each Topical Q0600   cinacalcet  30 mg Oral Q breakfast   darbepoetin (ARANESP) injection - DIALYSIS  100 mcg Subcutaneous Q Mon-1800   doxercalciferol  3 mcg Intravenous Q M,W,F-HD   feeding supplement  237 mL Oral BID BM   heparin injection (subcutaneous)  5,000 Units Subcutaneous Q8H   insulin aspart  0-5 Units Subcutaneous QHS   insulin aspart  0-9 Units Subcutaneous TID WC   insulin aspart  2 Units Subcutaneous TID WC   insulin glargine-yfgn  25 Units Subcutaneous Daily   midodrine  10 mg Oral TID WC   multivitamin  1 tablet Oral QHS   pantoprazole  40 mg Oral BID   sevelamer carbonate  4,000 mg Oral TID WC   thiamine  100 mg Oral Daily   Continuous Infusions:   LOS: 36 days   Hughie Closs, MD [facility]  12/14/2023, 1:57 PM   *Please note that this is a verbal dictation therefore any spelling or grammatical errors are due to the "Dragon [employer] One" system interpretation.  Please page via Amion and do not message via secure chat for urgent patient care matters. Secure chat can be used for non urgent patient care matters.  How to contact the [facility] Attending or Consulting provider 7A - 7P or covering provider during after hours 7P -7A, for this patient?  Check the care team  in [facility] and look for a) attending/consulting [facility] provider listed and b) the [facility] team listed. Page or secure chat 7A-7P. Log into [email or web address] and use [place]'s universal password to access. If you do not have the password, please contact the hospital operator. Locate the [facility] provider you are looking for under [facility] and page to a number that you can be directly reached. If you still have difficulty reaching the provider, please  page the [facility] (Director on Call) for the Hospitalists listed on amion for assistance.

## 2023-12-14 NOTE — Plan of Care (Signed)

## 2023-12-14 NOTE — Progress Notes (Signed)
PT Cancellation Note  Patient Details Name: [name] JEANSONNE Sr. MRN: [id number] DOB: [birth year]   Cancelled Treatment:    Reason Eval/Treat Not Completed: Patient at procedure or test/unavailable (Pt still in HD.  Will check back as able.)   [name] [name] 12/14/2023, 12:48 PM Dio Giller M,PT Acute Rehab Services [phone]

## 2023-12-14 NOTE — Progress Notes (Signed)
PT Cancellation Note  Patient Details Name: [name] BISSONNETTE Sr. MRN: [id number] DOB: 1961-02-15   Cancelled Treatment:    Reason Eval/Treat Not Completed: Patient at procedure or test/unavailable (Pt in HD currently.  Will return as able.)   Bevelyn Buckles 12/14/2023, 9:11 AM Zephyra Bernardi M,PT Acute Rehab Services [phone]

## 2023-12-14 NOTE — [place] FL2 (Signed)
[place] MEDICAID FL2 LEVEL OF CARE FORM     IDENTIFICATION  Patient Name: [name] BIELLO Sr. Birthdate: [birth year] Sex: male Admission Date (Current Location): 11/08/2023  [facility] and [place] Number:  Producer, television/film/video and Address:  [facility], [address] [place], [place] [address]      Provider Number: [id number]  Attending Physician Name and Address:  Hughie Closs, MD  Relative Name and Phone Number:  Vance Gather (Daughter)  [phone] (Mobile)    Current Level of Care: Hospital Recommended Level of Care: Skilled Nursing Facility Prior Approval Number:    Date Approved/Denied:   PASRR Number: [id number]  Discharge Plan: SNF    Current Diagnoses: Patient Active Problem List   Diagnosis Date Noted   Iron deficiency anemia 12/01/2023   Ischemic colitis (HCC) 11/27/2023   Bloody diarrhea 11/27/2023   Heme positive stool 11/25/2023   Acute gastroenteropathy due to Norovirus 11/25/2023   Aortic valve stenosis 11/15/2023   Atrial flutter with rapid ventricular response (HCC) 11/10/2023   NSTEMI (non-ST elevated myocardial infarction) (HCC) 11/08/2023   Hyperglycemia 10/24/2023   Abnormal EKG 10/24/2023   RUQ abdominal pain 10/30/2022   Cholelithiasis 10/29/2022   Hyperbilirubinemia 10/29/2022   Volume overload 10/19/2022   CHF (congestive heart failure) (HCC) 10/18/2022   Hyperosmolar hyperglycemic state (HHS) (HCC)    Diarrhea    Dehiscence of amputation stump of right lower extremity (HCC)    Abscess of right lower leg    Pressure injury of skin 07/02/2022   Cellulitis of right leg 07/01/2022   CAD (coronary artery disease) 05/24/2022   Leukocytosis 05/24/2022   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/23/2022   Obesity (BMI 30-39.9) 02/19/2022   Hypotension 02/08/2022   History of CVA  02/05/2022   Carotid stenosis 02/05/2022   Hyperkalemia 02/03/2022   acute on chronic Anemia of chronic renal failure   02/03/2022   Subacute osteomyelitis, right ankle and foot (HCC) 02/02/2022   Acute metabolic encephalopathy 02/02/2022   Sepsis with encephalopathy and septic shock (HCC) 02/02/2022   Protein-calorie malnutrition, mild (HCC) 02/02/2022   GERD (gastroesophageal reflux disease) 02/02/2022   Acute on chronic systolic CHF (congestive heart failure) (HCC) 08/14/2019   LBBB (left bundle branch block) 08/13/2019   Problem with vascular access 08/13/2019   Dialysis AV fistula malfunction (HCC) 08/12/2019   ESRD on dialysis (HCC) 09/20/2018   DM2 (diabetes mellitus, type 2) (HCC) 09/20/2018   HTN (hypertension) 09/20/2018    Orientation RESPIRATION BLADDER Height & Weight     Self  Normal Continent Weight: 224 lb 3.3 oz (101.7 kg) Height:  6\' 1"  (185.4 cm)  BEHAVIORAL SYMPTOMS/MOOD NEUROLOGICAL BOWEL NUTRITION STATUS      Incontinent Diet (see d/c summary)  AMBULATORY STATUS COMMUNICATION OF NEEDS Skin   Extensive Assist Verbally PU Stage and Appropriate Care, Other (Comment) (pressure injury buttocks stage 3; skin tear perenium)                       Personal Care Assistance Level of Assistance  Bathing, Feeding, Dressing Bathing Assistance: Maximum assistance Feeding assistance: Limited assistance Dressing Assistance: Maximum assistance     Functional Limitations Info  Sight, Hearing, Speech Sight Info: Impaired Hearing Info: Adequate Speech Info: Impaired    SPECIAL CARE FACTORS FREQUENCY  PT (By licensed PT), OT (By licensed OT)     PT Frequency: 5x/week OT Frequency: 5x/week     Speech Therapy Frequency: 5x/week  Contractures Contractures Info: Not present    Additional Factors Info  Code Status, Allergies Code Status Info: Full code Allergies Info: Sulfa Antibiotics   Insulin Sliding Scale Info: See dc summary Isolation Precautions Info: MRSA, Airborne and Contact precautions     Current Medications (12/14/2023):  This is the current hospital active  medication list Current Facility-Administered Medications  Medication Dose Route Frequency Provider Last Rate Last Admin   acetaminophen (TYLENOL) tablet 650 mg  650 mg Oral Q6H PRN Arrien, York Ram, MD   650 mg at 12/14/23 1435   Or   acetaminophen (TYLENOL) suppository 650 mg  650 mg Rectal Q6H PRN Coralie Keens, MD   650 mg at 11/10/23 2045   aspirin EC tablet 81 mg  81 mg Oral Daily Rai, Ripudeep K, MD   81 mg at 12/13/23 0825   atorvastatin (LIPITOR) tablet 40 mg  40 mg Oral Daily Arrien, York Ram, MD   40 mg at 12/14/23 1435   Chlorhexidine Gluconate Cloth 2 % PADS 6 each  6 each Topical Q0600 Delano Metz, MD   6 each at 12/13/23 0612   Chlorhexidine Gluconate Cloth 2 % PADS 6 each  6 each Topical Q0600 Oretha Milch, PA-C   6 each at 12/14/23 2595   cinacalcet (SENSIPAR) tablet 30 mg  30 mg Oral Q breakfast Arrien, York Ram, MD   30 mg at 12/14/23 1435   Darbepoetin Alfa (ARANESP) injection 100 mcg  100 mcg Subcutaneous Q Mon-1800 Sabra Heck B, MD   100 mcg at 12/07/23 1731   doxercalciferol (HECTOROL) injection 3 mcg  3 mcg Intravenous Q M,W,F-HD Lenny Pastel, PA-C   3 mcg at 12/14/23 0850   feeding supplement (ENSURE ENLIVE / ENSURE PLUS) liquid 237 mL  237 mL Oral BID BM Narda Bonds, MD   237 mL at 12/14/23 1436   heparin injection 5,000 Units  5,000 Units Subcutaneous Q8H Rai, Ripudeep K, MD   5,000 Units at 12/14/23 1435   insulin aspart (novoLOG) injection 0-5 Units  0-5 Units Subcutaneous QHS Hazeline Junker B, MD   3 Units at 12/04/23 2102   insulin aspart (novoLOG) injection 0-9 Units  0-9 Units Subcutaneous TID WC Hazeline Junker B, MD   1 Units at 12/14/23 1437   insulin aspart (novoLOG) injection 2 Units  2 Units Subcutaneous TID WC Rai, Ripudeep K, MD   2 Units at 12/14/23 1436   insulin glargine-yfgn (SEMGLEE) injection 25 Units  25 Units Subcutaneous Daily Dahal, Melina Schools, MD   25 Units at 12/13/23 0825   iohexol (OMNIPAQUE) 350 MG/ML  injection 100 mL  100 mL Intravenous Once PRN Imogene Burn, MD       midodrine (PROAMATINE) tablet 10 mg  10 mg Oral TID WC Pola Corn, NP   10 mg at 12/14/23 1435   multivitamin (RENA-VIT) tablet 1 tablet  1 tablet Oral QHS Narda Bonds, MD   1 tablet at 12/13/23 2227   ondansetron (ZOFRAN) tablet 4 mg  4 mg Oral Q6H PRN Arrien, York Ram, MD       Or   ondansetron [facility]) injection 4 mg  4 mg Intravenous Q6H PRN Arrien, York Ram, MD       Oral care mouth rinse  15 mL Mouth Rinse PRN Baron Hamper, MD       pantoprazole (PROTONIX) EC tablet 40 mg  40 mg Oral BID Leander Rams, RPH   40 mg at 12/14/23 1435  sevelamer carbonate (RENVELA) tablet 2,400 mg  2,400 mg Oral BID PRN Arrien, York Ram, MD       sevelamer carbonate (RENVELA) tablet 4,000 mg  4,000 mg Oral TID WC Arrien, York Ram, MD   4,000 mg at 12/14/23 1435   thiamine (VITAMIN B1) tablet 100 mg  100 mg Oral Daily Erick Blinks, MD   100 mg at 12/14/23 1435     Discharge Medications: Please see discharge summary for a list of discharge medications.  Relevant Imaging Results:  Relevant Lab Results:   Additional Information SS#: [id number]; HD pt - [facility] MWF 11:15 am arrival for 11:30 am chair time  Michaela Corner, [employer]

## 2023-12-14 NOTE — TOC Progression Note (Addendum)
Transition of Care (TOC) - Progression Note    Patient Details  Name: [name] [name]. MRN: [id number] Date of Birth: [birth year]  Transition of Care [facility] CM/SW Contact  Michaela Corner, [place] Phone Number: 12/14/2023, 10:36 AM  Clinical Narrative:   CSW left VM fro Debbie with [facility] regarding pt referral.   Per HD coordinator, pt can resume at [facility] MWF at 11:30 am chair time.  1:23PM: CSW spoke with Debbie at [facility] about pt referral. Eunice Blase states central intake is reviewing pt referral and she will get back to CSW with an update.   1:35PM: CSW spoke with pt to confirm decision on placement. Pt stated he will go to [facility] at this time.   2:43PM: Debbie with [facility] stated they can accept pt tomorrow for dc as long as pt can tolerate HD chair. CSW notified treatment team.   TOC will continue to follow.   Expected Discharge Plan: Skilled Nursing Facility Barriers to Discharge: SNF Pending bed offer  Expected Discharge Plan and Services       Living arrangements for the past 2 months: Single Family Home                           HH Arranged:  (Active with [facility])           Social Determinants of Health (SDOH) Interventions SDOH Screenings   Food Insecurity: Patient Unable To Answer (11/10/2023)  Housing: Patient Unable To Answer (11/10/2023)  Transportation Needs: Unknown (11/10/2023)  Utilities: Patient Unable To Answer (11/10/2023)  Alcohol Screen: Low Risk  (02/04/2022)  Depression (PHQ2-9): Low Risk  (08/06/2022)  Tobacco Use: Low Risk  (12/02/2023)    Readmission Risk Interventions    11/05/2022    3:08 PM  Readmission Risk Prevention Plan  Transportation Screening Complete  Medication Review (RN Care Manager) Complete  PCP or Specialist appointment within 3-5 days of discharge Complete  HRI or Home Care Consult Complete  SW Recovery Care/Counseling Consult Complete  Palliative Care Screening Complete   Skilled Nursing Facility Complete

## 2023-12-15 LAB — GLUCOSE, CAPILLARY: Glucose-Capillary: 190 mg/dL — ABNORMAL HIGH (ref 70–99)

## 2023-12-15 MED ORDER — ATORVASTATIN CALCIUM 40 MG PO TABS: 40.0000 mg | ORAL_TABLET | Freq: Every day | ORAL | 0 refills | Status: DC

## 2023-12-15 MED ORDER — PANTOPRAZOLE SODIUM 40 MG PO TBEC: 40.0000 mg | DELAYED_RELEASE_TABLET | Freq: Two times a day (BID) | ORAL | 0 refills | Status: DC

## 2023-12-15 MED ORDER — MIDODRINE HCL 10 MG PO TABS: 10.0000 mg | ORAL_TABLET | Freq: Three times a day (TID) | ORAL | 0 refills | Status: DC

## 2023-12-15 MED ORDER — ASPIRIN 81 MG PO TBEC: 81.0000 mg | DELAYED_RELEASE_TABLET | Freq: Every day | ORAL | 0 refills | Status: DC

## 2023-12-15 NOTE — Progress Notes (Signed)
Physical Therapy Treatment Patient Details Name: [name] LEMMERMAN Sr. MRN: [id number] DOB: [birth year] Today's Date: 12/15/2023   History of Present Illness Pt is 63 year old presented to [facility] on  11/08/23 after experiencing a near syncope during HD. Pt being worked up for an NSTEMI pt unable to tolerate medication and no longer plan to perform cardiac cath, and fever with acute metabolic encephalopathy.  Pt recently admitted 12/6-12/10/24 for a fall. PMH - htn, cardiomyopathy, CHF, dm, esrd on hd, R BKA, CVA    PT Comments  Pt pleasant and agreeable to PT treatment. Today's session focuses on gait training, increasing ambulatory distance, improving gait mechanics and safety using RW. Pt ambulated around his bed four times. Initially minA, but increased to modA as he fatigued. Intermittent VC to address gait abnormalities and safety concerns, which pt verbalized understanding and demonstrated improvement with in following bouts. Pt met 4/4 goals, updated based on his progress. Patient will benefit from continued inpatient follow up therapy, <3 hours/day.   If plan is discharge home, recommend the following: A lot of help with walking and/or transfers;Assistance with cooking/housework;Assist for transportation;Help with stairs or ramp for entrance   Can travel by private vehicle        Equipment Recommendations  Other (comment) (TBD at next venue)    Recommendations for Other Services       Precautions / Restrictions Precautions Precautions: Fall Required Braces or Orthoses: Other Brace Other Brace: R BKA prosthesis Restrictions Weight Bearing Restrictions Per Provider Order: No     Mobility  Bed Mobility Overal bed mobility: Needs Assistance Bed Mobility: Supine to Sit Rolling: Used rails, Supervision   Supine to sit: HOB elevated, Used rails, Contact guard Sit to supine: HOB elevated, Used rails, Contact guard assist   General bed mobility comments: Intermittent CGA at trunk as  pt scooted fwd to EOB.    Transfers Overall transfer level: Needs assistance Equipment used: Rolling walker (2 wheels) Transfers: Sit to/from Stand Sit to Stand: Mod assist, Min assist, From elevated surface           General transfer comment: Pt performed a total of 5 STS from EOB. Initially minA, but as he fatigued required modA. VC to improve eccentric control.    Ambulation/Gait Ambulation/Gait assistance: Min assist, Mod assist Gait Distance (Feet): 15 Feet (4x15, pt ambulated around the bed multiple times taking a seated rest break on the EOB whenever he got to the other side.) Assistive device: Rolling walker (2 wheels) Gait Pattern/deviations: Decreased step length - left, Decreased stance time - right, Decreased stride length, Trunk flexed   Gait velocity interpretation: <1.31 ft/sec, indicative of household ambulator   General Gait Details: Pt navigated around his room slowly, RLE passed L foot and LLE stopped even with R foot. Cued pt to bring RW closer to his body when turning and prior to sitting, which he demonstrated in following attempts. As pt fatigued required more physical assistance.   Stairs             Wheelchair Mobility     Tilt Bed    Modified Rankin (Stroke Patients Only)       Balance Overall balance assessment: Needs assistance Sitting-balance support: Feet supported, Single extremity supported Sitting balance-Leahy Scale: Fair Sitting balance - Comments: Pt sat EOB while R prosthesis and L shoe were donned by PT. Pt maintained upright posture without lean and unilateral UE support on bed.   Standing balance support: Bilateral upper extremity supported, [place]  on assistive device for balance, During functional activity Standing balance-Leahy Scale: Poor Standing balance comment: Pt relies on RW for stability during all OOB mobility.                            Cognition Arousal: Alert Behavior During Therapy: Flat  affect Overall Cognitive Status: Within Functional Limits for tasks assessed                                          Exercises      General Comments General comments (skin integrity, edema, etc.): VSS on RA. Doffed R BKA prosthesis and L shoe at end of session. Donned R shrinker.      Pertinent Vitals/Pain Pain Assessment Pain Assessment: No/denies pain    Home Living                          Prior Function            PT Goals (current goals can now be found in the care plan section) Acute Rehab PT Goals Patient Stated Goal: Leave here PT Goal Formulation: With patient Time For Goal Achievement: 12/29/23 Potential to Achieve Goals: Good Progress towards PT goals: Goals met and updated - see care plan    Frequency    Min 1X/week      PT Plan      Co-evaluation              AM-PAC PT "6 Clicks" Mobility   Outcome Measure  Help needed turning from your back to your side while in a flat bed without using bedrails?: A Little Help needed moving from lying on your back to sitting on the side of a flat bed without using bedrails?: A Lot Help needed moving to and from a bed to a chair (including a wheelchair)?: A Lot Help needed standing up from a chair using your arms (e.g., wheelchair or bedside chair)?: A Lot Help needed to walk in hospital room?: A Lot Help needed climbing 3-5 steps with a railing? : A Lot 6 Click Score: 13    End of Session Equipment Utilized During Treatment: Gait belt Activity Tolerance: Patient tolerated treatment well Patient left: in bed;with bed alarm set;with call bell/phone within reach Nurse Communication: Mobility status PT Visit Diagnosis: Muscle weakness (generalized) (M62.81);Difficulty in walking, not elsewhere classified (R26.2);Pain     Time: 1610-9604 PT Time Calculation (min) (ACUTE ONLY): 20 min  Charges:    $Gait Training: 8-22 mins                       Cheri Guppy, PT, DPT Acute  Rehabilitation Services Office: [phone] Secure Chat Preferred    Richardson Chiquito 12/15/2023, 1:29 PM

## 2023-12-15 NOTE — Progress Notes (Signed)
[facility] Progress Note   Subjective: Patient feels well with no complaints.  Hoping for discharge today  Objective Vitals:   12/14/23 1930 12/15/23 0450 12/15/23 0749 12/15/23 0756  BP: (!) 94/56 116/66 106/61 129/75  Pulse: 81 76 84 88  Resp: 18 (!) 22 18 13   Temp: 98.7 F (37.1 C) 97.8 F (36.6 C)  (!) 97 F (36.1 C)  TempSrc: Oral Oral  Axillary  SpO2: 95% 97% 97% 98%  Weight:  98.9 kg    Height:       Physical Exam General: Alert male in NAD Heart: Normal rate, no rub Lungs: Bilateral chest rise with no increased work of breathing Abdomen: Soft, non-distended, +BS Extremities: no edema b/l lower extremities Dialysis Access: AVF + t/b  Additional Objective Labs: Basic Metabolic Panel: Recent Labs  Lab 12/11/23 0256 12/13/23 1210 12/14/23 0739  NA 136 136 135  K 5.0 4.5 4.4  CL 98 95* 97*  CO2 26 27 25   GLUCOSE 217* 168* 124*  BUN 24* 29* 34*  CREATININE 8.80* 9.03* 10.57*  CALCIUM 9.8 9.6 9.4  PHOS  --  3.9 4.3   Liver Function Tests: Recent Labs  Lab 12/13/23 1210 12/14/23 0739  ALBUMIN 3.0* 2.9*   No results for input(s): "LIPASE", "AMYLASE" in the last 168 hours. CBC: Recent Labs  Lab 12/09/23 0253 12/10/23 0301 12/11/23 0832 12/14/23 0739  WBC 6.8 6.3 7.0 8.4  HGB 9.8* 9.9* 9.8* 10.1*  HCT 31.2* 32.2* 32.0* 32.7*  MCV 93.4 94.4 94.4 93.4  PLT 239 240 249 214   Blood Culture    Component Value Date/Time   SDES CSF 11/19/2023 1134   SPECREQUEST NONE 11/19/2023 1134   CULT  11/19/2023 1134    NO GROWTH 3 DAYS Performed at [facility], [address], [place], [place] [address]    REPTSTATUS 11/23/2023 FINAL 11/19/2023 1134    Cardiac Enzymes: No results for input(s): "CKTOTAL", "CKMB", "CKMBINDEX", "TROPONINI" in the last 168 hours. CBG: Recent Labs  Lab 12/14/23 0606 12/14/23 1427 12/14/23 1652 12/14/23 2105 12/15/23 0549  GLUCAP 149* 133* 115* 116* 190*   Iron Studies: No results for input(s):  "IRON", "TIBC", "TRANSFERRIN", "FERRITIN" in the last 72 hours. @lablastinr3 @ Studies/Results: No results found. Medications:   aspirin EC  81 mg Oral Daily   atorvastatin  40 mg Oral Daily   Chlorhexidine Gluconate Cloth  6 each Topical Q0600   Chlorhexidine Gluconate Cloth  6 each Topical Q0600   cinacalcet  30 mg Oral Q breakfast   darbepoetin (ARANESP) injection - DIALYSIS  100 mcg Subcutaneous Q Mon-1800   doxercalciferol  3 mcg Intravenous Q M,W,F-HD   feeding supplement  237 mL Oral BID BM   heparin injection (subcutaneous)  5,000 Units Subcutaneous Q8H   insulin aspart  0-5 Units Subcutaneous QHS   insulin aspart  0-9 Units Subcutaneous TID WC   insulin aspart  2 Units Subcutaneous TID WC   insulin glargine-yfgn  25 Units Subcutaneous Daily   midodrine  10 mg Oral TID WC   multivitamin  1 tablet Oral QHS   pantoprazole  40 mg Oral BID   sevelamer carbonate  4,000 mg Oral TID WC   thiamine  100 mg Oral Daily    Dialysis Orders: [facility] MWF 4h  450/700  105kg   2/2 bath   AVF   Heparin 7000 + 2000 midrun - Hectorol 7 mcg IV three times per week  Assessment/Plan: 1.AMS: Was  on cefepime which has been stopped. Per primary team some concern for encephalitis/meningitis, was on empiric antibiotics/antivirals until seen by ID on 11/18/23 and all was stopped.  S/P LP which ruled out bacterail meningitis/encephalitis. He did test + for flu A by resp panel. MRI without acute abnormality.  ID stopped abx and antivirals and signed off. Mental status has improved 2.  ESRD - usual HD is MWF.  Continue per schedule 4. NSTEMI- work up per cardiology/primary. No plan for invasive procedure and not DAPT candidate per cardiology.  5. Volume - No significant volume overload on exam. Will need lower dry weight on d/c.  6. Fever/Hypotension- BC with NGTD. Was started on board spectrum antibiotics/antivirals but now discontinued.  ID signed off. BP improved. 7. HFrEF/history  of severe AS- attempting to optimize volume with dialysis. ECHO  EF 30 to 35%. Seen by Cards - medical management of AS. Fluid restrictions and daily weights. On midodrine 10 mg 3 times daily.  8. Anemia  - Hgb 9s.  Now stable. S/p 1 unit prbc on 11/27/23.   Start Fe load 1/14.Aranesp 100 q Mon -last 1/27 9. Metabolic bone disease -Continue renvela. VDRA on hold d/t high calcium; coming down. 10. Debility - memory is better however he remains debilitated. Working with PT  11. GIB/Norovirus - C diff neg.  Completed capsule endoscopy. No active bleeding.

## 2023-12-15 NOTE — Discharge Summary (Signed)
Physician Discharge Summary  [name] Lance Sr. EXB:[id number] DOB: [birth year] DOA: 11/08/2023  PCP: Pcp, No  Admit date: 11/08/2023 Discharge date: 12/15/2023 30 Day Unplanned Readmission Risk Score    Flowsheet Row ED to Hosp-Admission (Current) from 11/08/2023 in [facility] 3E HF PCU  30 Day Unplanned Readmission Risk Score (%) 38.14 Filed at 12/15/2023 0401       This score is the patient's risk of an unplanned readmission within 30 days of being discharged (0 -100%). The score is based on dignosis, age, lab data, medications, orders, and past utilization.   Low:  0-14.9   Medium: 15-21.9   High: 22-29.9   Extreme: 30 and above          Admitted From: Home Disposition: SNF  Recommendations for Outpatient Follow-up:  Follow up with PCP in 1-2 weeks Please obtain BMP/CBC in one week Follow-up with cardiology on February 4 at 10:55 AM Follow-up with nephrology for hemodialysis Please follow up with your PCP on the following pending results: Unresulted Labs (From admission, onward)     Start     Ordered   Unscheduled  Occult blood card to lab, stool  As needed,   R      11/19/23 1729              Home Health: None Equipment/Devices: None  Discharge Condition: Stable CODE STATUS: Full code Diet recommendation: Renal/low-sodium  Subjective: Seen and examined.  No complaints.  Brief/Interim Summary: [name] Lance Sr. is a 63 y.o. male with PMH significant for ESRD-HD-MWF, DM2, HTN, nonischemic cardiomyopathy, mild systolic CHF, prior right BKA.  Patient presented to ED on 12/22 after experiencing an episode of chest pain, hypotension and near syncope during hemodialysis.  His initial blood pressure was low in 75/59. Troponin was significantly elevated and peaked at 9000, he was diagnosed with NSTEMI, started on heparin drip.  Patient was admitted for further workup.   Hospital course was also complicated altered mental status, influenza, GI bleeding,  norovirus diarrhea. He has had a gradual recovery and has been recommended to go to SNF by PT. Received HD in a chair 1/24.  He is being discharged to [facility] today.  Further details below.   NSTEMI-2D echo 11/09/2023 showed EF 30 -35% with regional WMA, moderate to severe aortic stenosis -Initially started on medical management with IV heparin drip, aspirin and Lipitor -Initial consideration of cardiac cath, was placed on trial of DAPT.  However after single dose of Plavix with aspirin, patient had profuse rectal bleeding, hence DAPT was held.  Per cardiology, no further plan for inpatient ischemic evaluation.  This can be revisited as outpatient when GI issues stabilized. -Per cardiology, started on aspirin 81 mg daily, outpatient cardiac evaluation planned.  Continue baby aspirin and Lipitor.  Patient has follow-up appointment with cardiology next week.     Acute GI bleeding- H&H is stable.   - continue protonix -CTA GI bleed 1/13 showed no active GI bleeding in the imaging  -Patient was offered EGD, colonoscopy, he declined. -Per GI, Dr. Leonides Schanz, capsule endoscopy completed, gastritis and duodenitis with small bowel angioectasia, no active bleeding  GI is okay with low-dose aspirin.  Patient was ruled out of H. pylori antigen.  GI recommended continuing twice daily PPI.  He has received almost 2 weeks of PPI, will prescribe 6 more weeks to complete 8 weeks.  He can follow-up with GI as needed.   Norovirus diarrhea- FOBT positive on 1/3, GI pathogen panel  positive for norovirus. Symptoms have resolved.    Acute exacerbation of combined systolic and diastolic CHF Moderate to severe aortic stenosis -2D echo 12/23 showed EF 30 -35% with regional WMA, grade 3 DD, mildly reduced RV systolic function, moderate to severe aortic valve stenosis  -GDMT limited by ESRD and low BP.  Continue midodrine -Metoprolol held due to hypotension.   -HD for volume management    ESRD-HD-MWF Plan for  hemodialysis tomorrow.  Went follow-up with nephrologist for hemodialysis. He completed hemodialysis in a chair on 12/11/2023   Influenza A positive -RVP positive for flu A + on 12/30.  Completed the course of Tamiflu.   Acute metabolic encephalopathy, likely due to prolonged delirium -Patient met sepsis criteria on admission, POA due to tachycardia, fever, tachypnea, altered mental status -Patient hospital stay was complicated by altered mental status.  There was initial concern for encephalitis vs meningitis. Neurology was consulted.  Patient was placed on broad-spectrum antibiotics -EEG 12/27 and 1/1 did not show any evidence of seizure activity. -MRI brain 1/3 negative for acute abnormality  -LP on 1/3,  CSF labs significant for elevated glucose and protein, not consistent with meningitis. Meningitis/encephalitis panel negative for pathogen. -ID and neurology signed off. Status has improved to baseline.  Sepsis physiology has resolved.   Type 2 diabetes mellitus uncontrolled -Hemoglobin A1c > 15.5 on 10/24/2023 he was managed with long-acting and short acting insulin here, resuming PTA 70/30 insulin      H/o CVA Continue Lipitor  Discharge plan was discussed with patient  Discharge Diagnoses:  Principal Problem:   NSTEMI (non-ST elevated myocardial infarction) (HCC) Active Problems:   Sepsis with encephalopathy and septic shock (HCC)   ESRD on dialysis (HCC)   Hypotension   Acute on chronic systolic CHF (congestive heart failure) (HCC)   HTN (hypertension)   Acute metabolic encephalopathy   DM2 (diabetes mellitus, type 2) (HCC)   History of CVA    Protein-calorie malnutrition, mild (HCC)   Obesity (BMI 30-39.9)   Atrial flutter with rapid ventricular response (HCC)   Aortic valve stenosis   Heme positive stool   Acute gastroenteropathy due to Norovirus   Ischemic colitis (HCC)   Bloody diarrhea   Iron deficiency anemia    Discharge Instructions   Allergies as of  12/15/2023       Reactions   Sulfa Antibiotics Diarrhea, Nausea And Vomiting        Medication List     TAKE these medications    acetaminophen 500 MG tablet Commonly known as: TYLENOL Take 1,000 mg by mouth 2 (two) times daily as needed for moderate pain (pain score 4-6), headache or fever.   aspirin EC 81 MG tablet Take 1 tablet (81 mg total) by mouth daily. Swallow whole.   atorvastatin 40 MG tablet Commonly known as: LIPITOR Take 1 tablet (40 mg total) by mouth daily.   BD Pen Needle Nano U/F 32G X 4 MM Misc Generic drug: Insulin Pen Needle Use with insulin pen.   cinacalcet 30 MG tablet Commonly known as: SENSIPAR Take 30 mg by mouth daily.   Dialyvite 800 0.8 MG Tabs Take 1 tablet by mouth daily.   lidocaine-prilocaine cream Commonly known as: EMLA Apply 1 Application topically Every Tuesday,Thursday,and Saturday with dialysis.   midodrine 10 MG tablet Commonly known as: PROAMATINE Take 1 tablet (10 mg total) by mouth 3 (three) times daily with meals.   NovoLIN 70/30 Kwikpen (70-30) 100 UNIT/ML KwikPen Generic drug: insulin isophane & regular human  KwikPen Inject 30U in the morning and 25U in the evening   sevelamer carbonate 800 MG tablet Commonly known as: RENVELA Take 2,400-4,000 mg by mouth See admin instructions. Take 5 tablets (4000mg ) three times daily with meals and 3 tablets (2400mg ) twice daily with snacks.        Contact information for follow-up providers     Azalee Course, [place] Follow up.   Specialties: Cardiology, Radiology Why: [facility] location - cardiology follow-up Tuesday Dec 22, 2023 10:55 AM. Wilmon Arms 15 minutes prior to appointment to check in. Wynema Birch is one of our PAs with our cardiology team. Contact information: [address] [place] [address] [phone]         [facility]. Go on 12/14/2023.   Why: Schedule is Monday, Wednesday, Friday with 11:15 am chair time.  On Monday  (1/27), please arrive at 10:30 am to complete paperwork prior to treatment.  Please send hoyer pad/sling with patient to HD appointments. Contact information: [address] [place] [address] [phone]         PCP Follow up in 1 week(s).               Contact information for after-discharge care     Destination     [facility] .   Service: Skilled Nursing Contact information: [address] [place] [address] [phone]                    Allergies  Allergen Reactions   Sulfa Antibiotics Diarrhea and Nausea And Vomiting    Consultations: GI, cardiology, ID, nephrology, neurology, palliative care   Procedures/Studies: DG Abd Portable 1V Result Date: 12/06/2023 CLINICAL DATA:  Abdominal pain EXAM: PORTABLE ABDOMEN - 1 VIEW COMPARISON:  02/11/2022 FINDINGS: Nonobstructive bowel gas pattern. No organomegaly or visible free air. Diffuse vascular calcifications noted. IMPRESSION: No acute findings. Electronically Signed   By: Charlett Nose M.D.   On: 12/06/2023 17:10   CT ANGIO GI BLEED Result Date: 11/30/2023 CLINICAL DATA:  Anemia, hematochezia EXAM: CTA ABDOMEN AND PELVIS WITHOUT AND WITH CONTRAST TECHNIQUE: Multidetector CT imaging of the abdomen and pelvis was performed using the standard protocol during bolus administration of intravenous contrast. Multiplanar reconstructed images and MIPs were obtained and reviewed to evaluate the vascular anatomy. RADIATION DOSE REDUCTION: This exam was performed according to the departmental dose-optimization program which includes automated exposure control, adjustment of the mA and/or kV according to patient size and/or use of iterative reconstruction technique. CONTRAST:  OMNIPAQUE IOHEXOL 350 MG/ML SOLN COMPARISON:  CT scan of the abdomen and pelvis 10/24/2023 FINDINGS: VASCULAR Aorta: No evidence of aneurysm. Scattered calcified plaque throughout  the abdominal aorta. No dissection. Celiac: Patent without evidence of aneurysm, dissection, vasculitis or significant stenosis. SMA: Patent without evidence of aneurysm, dissection, vasculitis or significant stenosis. Renals: Calcified atherosclerotic plaque at the origin of both renal arteries. Probable mild bilateral renal artery stenosis. IMA: Patent without evidence of aneurysm, dissection, vasculitis or significant stenosis. Inflow: Patent without evidence of aneurysm, dissection, vasculitis or significant stenosis. Proximal Outflow: Bilateral common femoral and visualized portions of the superficial and profunda femoral arteries are patent without evidence of aneurysm, dissection, vasculitis or significant stenosis. Veins: No focal venous abnormality. Review of the MIP images confirms the above findings. NON-VASCULAR Lower chest: No acute abnormality.  Cardiomegaly. Hepatobiliary: Normal hepatic contour and morphology. No discrete hepatic lesion. Small stones present within the gallbladder lumen. No  biliary ductal dilatation. Pancreas: Unremarkable. No pancreatic ductal dilatation or surrounding inflammatory changes. Spleen: Normal in size without focal abnormality. Adrenals/Urinary Tract: Normal adrenal glands. Bilateral renal atrophy. Multifocal small circumscribed water enhancing cysts consistent with simple cysts. No imaging follow-up is recommended. No hydronephrosis or enhancing renal mass. The ureters are unremarkable. The bladder is decompressed. Stomach/Bowel: No evidence of contrast extravasation to suggest acute GI bleeding. No focal bowel wall thickening or evidence of obstruction. Lymphatic: No suspicious lymphadenopathy. Reproductive: Prostate is unremarkable. Other: No abdominal wall hernia or abnormality. No abdominopelvic ascites. Musculoskeletal: No acute or significant osseous findings. 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 No lytic or blastic  osseous lesion. IMPRESSION: 1. No evidence of active GI bleeding at the imaging. 2. No acute abnormality within the abdomen or pelvis. 3. Extensive atherosclerotic vascular calcifications throughout the aorta without aneurysm or dissection. 4. Mild bilateral renal artery stenoses. 5. Cardiomegaly. 6. Cholelithiasis without secondary findings of acute cholecystitis. 7. Mild bilateral renal atrophy. 8. 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 Aortic Atherosclerosis (ICD10-I70.0). Electronically Signed   By: Malachy Moan M.D.   On: 11/30/2023 16:04   MR BRAIN WO CONTRAST Result Date: 11/20/2023 CLINICAL DATA:  Mental status change, unknown cause EXAM: MRI HEAD WITHOUT CONTRAST TECHNIQUE: Multiplanar, multiecho pulse sequences of the brain and surrounding structures were obtained without intravenous contrast. COMPARISON:  11/18/2023 CT head FINDINGS: Brain: No acute infarction, hemorrhage, hydrocephalus, extra-axial collection or mass lesion. Vascular: Normal flow voids. Skull and upper cervical spine: Normal marrow signal. Sinuses/Orbits: Mostly clear sinuses.  No acute findings. IMPRESSION: Normal brain MRI. No acute abnormality. Electronically Signed   By: Feliberto Harts M.D.   On: 11/20/2023 19:28   IR LUMBAR PUNCTURE Result Date: 11/20/2023 CLINICAL DATA:  63 year old male with altered mental status. Lumbar puncture requested. EXAM: LUMBAR PUNCTURE UNDER FLUOROSCOPY PROCEDURE: An appropriate skin entry site was determined fluoroscopically. Operator donned sterile gloves and mask. Skin site was marked, then prepped with Betadine, draped in usual sterile fashion, and infiltrated locally with 1% lidocaine. A 20 gauge spinal needle advanced into the thecal sac at L2-L3 from a left interlaminar approach. Clear colorless CSF spontaneously returned, with opening pressure of 24 cm water. 13 ml CSF were collected and divided among 4 sterile vials for the requested  laboratory studies. The needle was then removed. The patient tolerated the procedure well and there were no complications. FLUOROSCOPY: Radiation Exposure Index (as provided by the fluoroscopic device): 4.0 mGy Kerma IMPRESSION: Technically successful lumbar puncture under fluoroscopy. This exam was performed by Loyce Dys PA-C, and was supervised and interpreted by Gilmer Mor, MD. Electronically Signed   By: Gilmer Mor D.O.   On: 11/20/2023 13:37   Overnight EEG with video Result Date: 11/19/2023 Charlsie Quest, MD     11/19/2023 12:11 PM Patient Name: [name] AGOSTINELLI Sr. MRN: [id number] Epilepsy Attending: Charlsie Quest Referring Physician/Provider: Gordy Councilman, MD Duration: 11/18/2023 4098 to 11/19/2023 1191  Patient history: 63yo M with ams getting eeg to evaluate for seizure  Level of alertness: Awake/ lethargic  AEDs during EEG study: None  Technical aspects: This EEG study was done with scalp electrodes positioned according to the 10-20 International system of electrode placement. Electrical activity was reviewed with band pass filter of 1-70Hz , sensitivity of 7 uV/mm, display speed of 75mm/sec with a 60Hz  notched filter applied as appropriate. EEG data were recorded continuously  and digitally stored.  Video monitoring was available and reviewed as appropriate.  Description: EEG showed continuous generalized 3-5hz  theta-delta slowing. Hyperventilation and photic stimulation were not performed.    ABNORMALITY - Continuous slow, generalized  IMPRESSION: This study is suggestive of moderate diffuse encephalopathy. No seizures or epileptiform discharges were seen throughout the recording.  Charlsie Quest   EEG adult Result Date: 11/18/2023 Charlsie Quest, MD     11/18/2023 10:17 AM Patient Name: [name] KARWOWSKI Sr. MRN: [id number] Epilepsy Attending: Charlsie Quest Referring Physician/Provider: Zigmund Daniel., MD Date: 11/17/2023 Duration: 22.41 mins  Patient history: 63yo M with ams  getting eeg to evaluate for seizure  Level of alertness: Awake/ lethargic  AEDs during EEG study: None  Technical aspects: This EEG study was done with scalp electrodes positioned according to the 10-20 International system of electrode placement. Electrical activity was reviewed with band pass filter of 1-70Hz , sensitivity of 7 uV/mm, display speed of 20mm/sec with a 60Hz  notched filter applied as appropriate. EEG data were recorded continuously and digitally stored.  Video monitoring was available and reviewed as appropriate.  Description: EEG showed continuous generalized 3-5hz  theta-delta slowing.Hyperventilation and photic stimulation were not performed.    ABNORMALITY - Continuous slow, generalized  IMPRESSION: This study is suggestive of moderate diffuse encephalopathy. No seizures or epileptiform discharges were seen throughout the recording.  Priyanka Annabelle Harman   CT HEAD WO CONTRAST ( ) Result Date: 11/18/2023 CLINICAL DATA:  Delirium EXAM: CT HEAD WITHOUT CONTRAST TECHNIQUE: Contiguous axial images were obtained from the base of the skull through the vertex without intravenous contrast. RADIATION DOSE REDUCTION: This exam was performed according to the departmental dose-optimization program which includes automated exposure control, adjustment of the mA and/or kV according to patient size and/or use of iterative reconstruction technique. COMPARISON:  Brain MRI 11/11/2023 FINDINGS: Brain: No evidence of acute infarction, hemorrhage, hydrocephalus, extra-axial collection or mass lesion/mass effect. Chronic lacunar infarcts at the bilateral basal ganglia. Ischemic gliosis in the cerebral white matter is mild. Mild for age cerebral volume loss. Vascular: No hyperdense vessel or unexpected calcification. Skull: Normal. Negative for fracture or focal lesion. Sinuses/Orbits: No acute finding. IMPRESSION: No acute finding or change since brain MRI 11/11/2023. Electronically Signed   By: Tiburcio Pea M.D.    On: 11/18/2023 09:02   DG CHEST PORT 1 VIEW Result Date: 11/16/2023 CLINICAL DATA:  Cough and congestion EXAM: PORTABLE CHEST 1 VIEW COMPARISON:  11/10/2019 FINDINGS: Check shadow is enlarged but stable. Aortic calcifications are again seen. Lungs are hypoinflated but clear. No focal infiltrate or effusion is noted. IMPRESSION: No acute abnormality noted. Electronically Signed   By: Alcide Clever M.D.   On: 11/16/2023 18:19     Discharge Exam: Vitals:   12/14/23 1930 12/15/23 0450  BP: (!) 94/56 116/66  Pulse: 81 76  Resp: 18 (!) 22  Temp: 98.7 F (37.1 C) 97.8 F (36.6 C)  SpO2: 95% 97%   Vitals:   12/14/23 1429 12/14/23 1653 12/14/23 1930 12/15/23 0450  BP: (!) 97/55 (!) 105/47 (!) 94/56 116/66  Pulse: 85 85 81 76  Resp: 18 18 18  (!) 22  Temp: 99.8 F (37.7 C) 98.6 F (37 C) 98.7 F (37.1 C) 97.8 F (36.6 C)  TempSrc: Oral Oral Oral Oral  SpO2: 96% 96% 95% 97%  Weight:    98.9 kg  Height:        General: Pt is alert, awake, not in acute distress Cardiovascular: RRR, S1/S2 +,  no rubs, no gallops Respiratory: CTA bilaterally, no wheezing, no rhonchi Abdominal: Soft, NT, ND, bowel sounds + Extremities: no edema, no cyanosis, right BKA    The results of significant diagnostics from this hospitalization (including imaging, microbiology, ancillary and laboratory) are listed below for reference.     Microbiology: No results found for this or any previous visit (from the past 240 hours).   Labs: BNP (last 3 results) No results for input(s): "BNP" in the last 8760 hours. Basic Metabolic Panel: Recent Labs  Lab 12/09/23 0253 12/10/23 0301 12/11/23 0256 12/13/23 1210 12/14/23 0739  NA 130* 134* 136 136 135  K 5.4* 4.5 5.0 4.5 4.4  CL 93* 96* 98 95* 97*  CO2 24 27 26 27 25   GLUCOSE 240* 147* 217* 168* 124*  BUN 30* 18 24* 29* 34*  CREATININE 9.60* 7.02* 8.80* 9.03* 10.57*  CALCIUM 9.6 9.5 9.8 9.6 9.4  PHOS  --   --   --  3.9 4.3   Liver Function  Tests: Recent Labs  Lab 12/13/23 1210 12/14/23 0739  ALBUMIN 3.0* 2.9*   No results for input(s): "LIPASE", "AMYLASE" in the last 168 hours. No results for input(s): "AMMONIA" in the last 168 hours. CBC: Recent Labs  Lab 12/09/23 0253 12/10/23 0301 12/11/23 0832 12/14/23 0739  WBC 6.8 6.3 7.0 8.4  HGB 9.8* 9.9* 9.8* 10.1*  HCT 31.2* 32.2* 32.0* 32.7*  MCV 93.4 94.4 94.4 93.4  PLT 239 240 249 214   Cardiac Enzymes: No results for input(s): "CKTOTAL", "CKMB", "CKMBINDEX", "TROPONINI" in the last 168 hours. BNP: Invalid input(s): "POCBNP" CBG: Recent Labs  Lab 12/14/23 0606 12/14/23 1427 12/14/23 1652 12/14/23 2105 12/15/23 0549  GLUCAP 149* 133* 115* 116* 190*   D-Dimer No results for input(s): "DDIMER" in the last 72 hours. Hgb A1c No results for input(s): "HGBA1C" in the last 72 hours. Lipid Profile No results for input(s): "CHOL", "HDL", "LDLCALC", "TRIG", "CHOLHDL", "LDLDIRECT" in the last 72 hours. Thyroid function studies No results for input(s): "TSH", "T4TOTAL", "T3FREE", "THYROIDAB" in the last 72 hours.  Invalid input(s): "FREET3" Anemia work up No results for input(s): "VITAMINB12", "FOLATE", "FERRITIN", "TIBC", "IRON", "RETICCTPCT" in the last 72 hours. Urinalysis    Component Value Date/Time   COLORURINE AMBER (A) 07/01/2022 1143   APPEARANCEUR CLEAR 07/01/2022 1143   LABSPEC 1.017 07/01/2022 1143   PHURINE 7.0 07/01/2022 1143   GLUCOSEU NEGATIVE 07/01/2022 1143   HGBUR NEGATIVE 07/01/2022 1143   BILIRUBINUR NEGATIVE 07/01/2022 1143   KETONESUR NEGATIVE 07/01/2022 1143   PROTEINUR >=300 (A) 07/01/2022 1143   NITRITE NEGATIVE 07/01/2022 1143   LEUKOCYTESUR NEGATIVE 07/01/2022 1143   Sepsis Labs Recent Labs  Lab 12/09/23 0253 12/10/23 0301 12/11/23 0832 12/14/23 0739  WBC 6.8 6.3 7.0 8.4   Microbiology No results found for this or any previous visit (from the past 240 hours).  FURTHER DISCHARGE INSTRUCTIONS:   Get Medicines  reviewed and adjusted: Please take all your medications with you for your next visit with your Primary MD   Laboratory/radiological data: Please request your Primary MD to go over all hospital tests and procedure/radiological results at the follow up, please ask your Primary MD to get all Hospital records sent to his/her office.   In some cases, they will be blood work, cultures and biopsy results pending at the time of your discharge. Please request that your primary care M.D. goes through all the records of your hospital data and follows up on these results.   Also Note  the following: If you experience worsening of your admission symptoms, develop shortness of breath, life threatening emergency, suicidal or homicidal thoughts you must seek medical attention immediately by calling 911 or calling your MD immediately  if symptoms less severe.   You must read complete instructions/literature along with all the possible adverse reactions/side effects for all the Medicines you take and that have been prescribed to you. Take any new Medicines after you have completely understood and accpet all the possible adverse reactions/side effects.    Do not drive when taking Pain medications or sleeping medications (Benzodaizepines)   Do not take more than prescribed Pain, Sleep and Anxiety Medications. It is not advisable to combine anxiety,sleep and pain medications without talking with your primary care practitioner   Special Instructions: If you have smoked or chewed Tobacco  in the last 2 yrs please stop smoking, stop any regular Alcohol  and or any Recreational drug use.   Wear Seat belts while driving.   Please note: You were cared for by a hospitalist during your hospital stay. Once you are discharged, your primary care physician will handle any further medical issues. Please note that NO REFILLS for any discharge medications will be authorized once you are discharged, as it is imperative that you  return to your primary care physician (or establish a relationship with a primary care physician if you do not have one) for your post hospital discharge needs so that they can reassess your need for medications and monitor your lab values  Time coordinating discharge: Over 30 minutes  SIGNED:   Hughie Closs, MD  [facility] 12/15/2023, 7:47 AM *Please note that this is a verbal dictation therefore any spelling or grammatical errors are due to the "[employer]" system interpretation. If 7PM-7AM, please contact night-coverage [email or web address]

## 2023-12-15 NOTE — Progress Notes (Signed)
[facility] Patient Discharge Orders- [facility] CLINIC: [facility]   Patient's name: [name] REINARD Sr. Admit/DC Dates: 11/08/2023 - 12/15/2023  Discharge Diagnoses: Ams  on  cefepime / improved after dc   NSTEMI- work up per cardiology/primary. No plan for invasive procedure and not DAPT candidate per cardiology.  +  flu A by resp panel  HFrEF/history of severe AS- per Card Med RX  Acute exacerbation of combined systolic and diastolic CHF  GIB/Norovirus - C diff neg. Completed capsule endoscopy. No active bleeding. GI, Dr. Leonides Schanz, capsule endoscopy completed, gastritis and duodenitis with small bowel angioectasia, no active bleeding  GI is okay with low-dose aspirin.    Aranesp: Given: 1/27 100 mcg  hgb   10.1  PRBC's Given: no Date/# of units: 0 ESA dose for discharge: mircera 100 mcg IV q 2 weeks  IV Iron dose at discharge: no given iron load   Heparin change: hold with gi bleed  EDW Change: yes  New EDW: 99.5  Bath Change: no  Access intervention/Change: no Details:  Hectorol/Calcitriol change: no  Discharge Labs: Calcium9.4 Phosphorus 4.3 Albumin 2.9 K+ 4.4  IV Antibiotics: no Details:  On Coumadin?: no Last INR: Next INR: Managed By:   OTHER/APPTS/LAB ORDERS:    D/C Meds to be reconciled by nurse after every discharge.  Completed By:   Reviewed by: MD:______ RN_______

## 2023-12-15 NOTE — Progress Notes (Signed)
D/C order noted. Contacted [facility] to be advised of pt's d/c today and that should resume care tomorrow. Clinic provided snf name as well. HD appts added to AVS. Renal PA to send orders to clinic for tomorrow's treatment. Referral for [facility] canceled with [facility] admissions.   Olivia Canter Renal Navigator [phone]

## 2023-12-15 NOTE — TOC Transition Note (Signed)
Transition of Care [facility]) - Discharge Note   Patient Details  Name: [name] LEVIT Sr. MRN: [id number] Date of Birth: [birth year]  [facility]) CM/SW Contact:  Michaela Corner, LCSWA Phone Number: 12/15/2023, 11:30 AM   Clinical Narrative:   Patient will DC to: [facility] Anticipated DC date: 12/15/2023 Family notified: Hospital doctor (dtr; called w/ pts permission) Transport by: Sharin Mons   Per MD patient ready for DC to [facility] [place]. RN to call report prior to discharge [phone]; room B14-1). RN, patient, patient's family, and facility notified of DC. Discharge Summary and FL2 sent to facility. DC packet on chart. Ambulance transport requested for patient.   CSW will sign off for now as social work intervention is no longer needed. Please consult [place] again if new needs arise.      Final next level of care: Skilled Nursing Facility Barriers to Discharge: Barriers Resolved   Patient Goals and CMS Choice            Discharge Placement              Patient chooses bed at: Other - please specify in the comment section below: [facility]) Patient to be transferred to facility by: [facility]tar Name of family member notified: Hospital doctor (dtr; with pts permission) Patient and family notified of of transfer: 12/15/23  Discharge Plan and Services Additional resources added to the After Visit Summary for                            [facility] Arranged:  (Active with [facility])          Social Drivers of Health (SDOH) Interventions SDOH Screenings   Food Insecurity: Patient Unable To Answer (11/10/2023)  Housing: Patient Unable To Answer (11/10/2023)  Transportation Needs: Unknown (11/10/2023)  Utilities: Patient Unable To Answer (11/10/2023)  Alcohol Screen: Low Risk  (02/04/2022)  Depression (PHQ2-9): Low Risk  (08/06/2022)  Tobacco Use: Low Risk  (12/02/2023)     Readmission Risk Interventions    11/05/2022    3:08 PM  Readmission Risk Prevention Plan   Transportation Screening Complete  Medication Review (RN Care Manager) Complete  PCP or Specialist appointment within 3-5 days of discharge Complete  HRI or Home Care Consult Complete  SW Recovery Care/Counseling Consult Complete  Palliative Care Screening Complete  Skilled Nursing Facility Complete

## 2023-12-15 NOTE — Care Management Important Message (Signed)
Important Message  Patient Details  Name: [name] WIENEKE Sr. MRN: [id number] Date of Birth: 11/15/1961   Important Message Given:  Yes - Medicare IM     Renie Ora 12/15/2023, 11:05 AM

## 2023-12-18 ENCOUNTER — Ambulatory Visit: Payer: Medicare Other | Admitting: Physician Assistant

## 2023-12-22 ENCOUNTER — Ambulatory Visit: Payer: Medicare Other | Admitting: Physician Assistant

## 2024-01-10 NOTE — Progress Notes (Unsigned)
 Cardiology Office Note    Date:  01/12/2024  ID:  [name] DESHMUKH Sr., DOB 1961/01/13, MRN [id number] PCP:  Pcp, No  Cardiologist:  Nona Dell, MD  Electrophysiologist:  None   Chief Complaint: Follow up for NSTEMI   History of Present Illness: .    [name] SKEET Sr. is a 63 y.o. male with visit-pertinent history of ESRD on HD Monday Wednesday Friday, type 2 diabetes mellitus, hypertension, NICM, chronic HFrEF, prior right BKA, obesity.  Patient previously had cardiac catheterization in [place] in 2019 due to LV dysfunction, EF 25%, found to have 30% stenosis approximately D and distal RCA.  Was medically managed at that time.  In 05/2021 he underwent nuclear stress test through [facility],  "There is a moderate sized, severe, perfusion defect involving the apical, apical inferior, mid inferior, and basal inferior segments. The defects of the involved segments are nearly completely reversible. This is consistent with probable ischemia."  He was medically managed.    Patient admitted 11/08/2023 with episode of chest pain, hypotension and near syncope during HD.  Found to have NSTEMI and altered mental status.  Cath deferred due to encephalopathy, influenza A.  Hospital course notable for drop in EF to 30 to 35% with possible severe AS, diarrhea possibly antibiotic related and Hemoccult positive stool.  Patient presented 10/2023 after having near syncope during HD, high sensitive renin peaked at 8942.  Cath deferred due to initial issues with encephalopathy requiring medical workup, later during admission cardiology was again asked to revisit cath, complicated by concern for GI bleed.  GI originally planned conservative management, cardiology trial addition of Plavix to ASA but developed recurrent GI bleed.  It was felt by Dr. Izora Ribas and Dr. Antoine Poche that patient was not a DAPT candidate given recurrent GI bleeding, no plan for invasive evaluation.  Echocardiogram on 11/09/2023 indicated EF of  30 to 35% with regional wall motion abnormalities, grade 3 diastolic dysfunction, mildly reduced RV systolic function, moderate to severe aortic valve stenosis.  Patient was able to tolerate aspirin 81 mg daily.  During admission EGD and colonoscopy was offered, patient declined.  Patient did undergo capsule endoscopy that indicated gastritis and duodenitis with small bowel angioectasia, no active bleeding.  Patient to undergo 8 weeks of twice daily PPI.  Patient was discharged to [facility] skilled nursing facility on 12/15/2023.  Today patient presents for follow-up.  He reports that he has been extremely fatigued and he has been dizzy and lightheaded. He denies chest pain, shortness of breath.  Patient reports that he did attend dialysis yesterday, noted his blood pressure was low then although he is unsure of how low.  Patients driver reports that he has not been acting like himself since he was picked up this morning.  It is unclear if patient took his midodrine this morning or if he has been taking his medications as prescribed, initially he reported that he had been taking his medications then informed a nurse that he had not.  Patient also initially reported that he had had some GI bleeding, then later denied.  Upon EMS arrival patient coughed hard and became unresponsive for a few seconds, then again became responsive, witnessed by myself and EMS. Patient's driver reports that he is no longer staying at skilled nursing facility, has moved in with his brother. ROS: .   Today he denies chest pain, shortness of breath, lower extremity edema, palpitations, melena, hematuria, hemoptysis, diaphoresis, weakness, syncope, orthopnea, and PND.  All other  systems are reviewed and otherwise negative. Studies Reviewed: Marland Kitchen   EKG:  EKG is ordered today, personally reviewed, demonstrating  EKG Interpretation Date/Time:  Tuesday January 12 2024 10:33:59 EST Ventricular Rate:  83 PR Interval:  240 QRS  Duration:  146 QT Interval:  428 QTC Calculation: 502 R Axis:   -76  Text Interpretation: Sinus rhythm with 1st degree A-V block with Premature atrial complexes Left axis deviation Left bundle branch block When compared with ECG of 07-Dec-2023 04:49, Premature atrial complexes are now Present Left bundle branch block has replaced Non-specific intra-ventricular conduction block Criteria for Anterior infarct are no longer Present Criteria for Anterolateral infarct are no longer Present Confirmed by Reather Littler [phone]) on 01/12/2024 12:53:27 PM   CV Studies:  Cardiac Studies & Procedures   ______________________________________________________________________________________________     ECHOCARDIOGRAM  ECHOCARDIOGRAM COMPLETE 11/09/2023  Narrative ECHOCARDIOGRAM REPORT    Patient Name:   [name] DUBIN Sr. Date of Exam: 11/09/2023 Medical Rec #:  [id number]         Height:       73.0 in Accession #:    [id number]        Weight:       250.0 lb Date of Birth:  [birth year]        BSA:          2.366 m Patient Age:    62 years          BP:           126/56 mmHg Patient Gender: M                 HR:           89 bpm. [facility]  Procedure: 2D Echo, Cardiac Doppler and Color Doppler  Indications:    Chest Pain R07.9  History:        Patient has prior history of Echocardiogram examinations, most recent 10/25/2023. Cardiomyopathy and CHF, Previous Myocardial Infarction and CAD; Risk Factors:Hypertension and Diabetes.  Sonographer:    Celesta Gentile RCS Referring Phys: [phone] CHRISTOPHER RONALD BERGE  IMPRESSIONS   1. Left ventricular ejection fraction, by estimation, is 30 to 35%. The left ventricle has moderately decreased function. The left ventricle demonstrates regional wall motion abnormalities (see scoring diagram/findings for description). There is moderate concentric left ventricular hypertrophy. Left ventricular diastolic parameters are consistent with Grade III  diastolic dysfunction (restrictive). 2. Right ventricular systolic function is mildly reduced. The right ventricular size is normal. There is mildly elevated pulmonary artery systolic pressure. The estimated right ventricular systolic pressure is 42.2 mmHg. 3. Left atrial size was moderately dilated. 4. The mitral valve is degenerative. Trivial mitral valve regurgitation. Moderate mitral annular calcification. 5. The aortic valve is tricuspid. There is moderate calcification of the aortic valve. Aortic valve regurgitation is not visualized. Moderate to severe aortic valve stenosis - suspect upper end of scale, low flow/low gradient. Aortic valve area, by VTI measures 0.98 cm. Aortic valve mean gradient measures 22.0 mmHg. Dimentionless index 0.19. 6. Aortic dilatation noted. There is borderline dilatation of the aortic root, measuring 40 mm. 7. The inferior vena cava is dilated in size with <50% respiratory variability, suggesting right atrial pressure of 15 mmHg.  Comparison(s): Prior images reviewed side by side. LVEF has decreased, 30-35% range with basal inferoposterior akinesis and restrictive diastolic filling. Aortic stenosis possibly severe (low flow/low gradient). Mildly elevated RVSP.  FINDINGS Left Ventricle: Left ventricular ejection fraction, by estimation, is 30 to  35%. The left ventricle has moderately decreased function. The left ventricle demonstrates regional wall motion abnormalities. The left ventricular internal cavity size was normal in size. There is moderate concentric left ventricular hypertrophy. Left ventricular diastolic parameters are consistent with Grade III diastolic dysfunction (restrictive).   LV Wall Scoring: The inferior wall, posterior wall, and basal anterolateral segment are akinetic. The entire anterior wall, entire septum, entire apex, and mid anterolateral segment are hypokinetic.  Right Ventricle: The right ventricular size is normal. No increase in  right ventricular wall thickness. Right ventricular systolic function is mildly reduced. There is mildly elevated pulmonary artery systolic pressure. The tricuspid regurgitant velocity is 2.61 m/s, and with an assumed right atrial pressure of 15 mmHg, the estimated right ventricular systolic pressure is 42.2 mmHg.  Left Atrium: Left atrial size was moderately dilated.  Right Atrium: Right atrial size was normal in size.  Pericardium: There is no evidence of pericardial effusion.  Mitral Valve: The mitral valve is degenerative in appearance. There is mild thickening of the mitral valve leaflet(s). There is mild calcification of the mitral valve leaflet(s). Moderate mitral annular calcification. Trivial mitral valve regurgitation.  Tricuspid Valve: The tricuspid valve is grossly normal. Tricuspid valve regurgitation is mild.  Aortic Valve: The aortic valve is tricuspid. There is moderate calcification of the aortic valve. There is moderate aortic valve annular calcification. Aortic valve regurgitation is not visualized. Moderate to severe aortic stenosis is present. Aortic valve mean gradient measures 22.0 mmHg. Aortic valve peak gradient measures 32.0 mmHg. Aortic valve area, by VTI measures 0.98 cm.  Pulmonic Valve: The pulmonic valve was grossly normal. Pulmonic valve regurgitation is trivial.  Aorta: Aortic dilatation noted. There is borderline dilatation of the aortic root, measuring 40 mm.  Venous: The inferior vena cava is dilated in size with less than 50% respiratory variability, suggesting right atrial pressure of 15 mmHg.  IAS/Shunts: No atrial level shunt detected by color flow Doppler.   LEFT VENTRICLE PLAX 2D LVIDd:         5.80 cm      Diastology LVIDs:         4.80 cm      LV e' medial:    4.68 cm/s LV PW:         1.40 cm      LV E/e' medial:  28.4 LV IVS:        1.40 cm      LV e' lateral:   5.33 cm/s LVOT diam:     2.60 cm      LV E/e' lateral: 25.0 LV SV:          57 LV SV Index:   24 LVOT Area:     5.31 cm  LV Volumes (MOD) LV vol d, MOD A2C: 169.0 ml LV vol d, MOD A4C: 184.0 ml LV vol s, MOD A2C: 97.6 ml LV vol s, MOD A4C: 111.0 ml LV SV MOD A2C:     71.4 ml LV SV MOD A4C:     184.0 ml LV SV MOD BP:      72.1 ml  RIGHT VENTRICLE RV S prime:     6.31 cm/s TAPSE (M-mode): 1.4 cm  LEFT ATRIUM              Index        RIGHT ATRIUM           Index LA diam:        5.10 cm  2.16 cm/m  RA Area:     20.40 cm LA Vol (A2C):   105.0 ml 44.38 ml/m  RA Volume:   61.40 ml  25.95 ml/m LA Vol (A4C):   101.0 ml 42.69 ml/m LA Biplane Vol: 102.0 ml 43.12 ml/m AORTIC VALVE AV Area (Vmax):    1.04 cm AV Area (Vmean):   0.96 cm AV Area (VTI):     0.98 cm AV Vmax:           283.00 cm/s AV Vmean:          219.000 cm/s AV VTI:            0.583 m AV Peak Grad:      32.0 mmHg AV Mean Grad:      22.0 mmHg LVOT Vmax:         55.40 cm/s LVOT Vmean:        39.400 cm/s LVOT VTI:          0.108 m LVOT/AV VTI ratio: 0.19  AORTA Ao Root diam: 4.00 cm  MITRAL VALVE                TRICUSPID VALVE MV Area (PHT): 6.17 cm     TR Peak grad:   27.2 mmHg MV Decel Time: 123 msec     TR Vmax:        261.00 cm/s MR Peak grad: 92.5 mmHg MR Mean grad: 54.0 mmHg     SHUNTS MR Vmax:      481.00 cm/s   Systemic VTI:  0.11 m MR Vmean:     334.0 cm/s    Systemic Diam: 2.60 cm MV E velocity: 133.00 cm/s MV A velocity: 66.90 cm/s MV E/A ratio:  1.99  Nona Dell MD Electronically signed by Nona Dell MD Signature Date/Time: 11/09/2023/1:41:56 PM    Final          ______________________________________________________________________________________________      Current Reported Medications:.    Current Meds  Medication Sig   acetaminophen (TYLENOL) 500 MG tablet Take 1,000 mg by mouth 2 (two) times daily as needed for moderate pain (pain score 4-6), headache or fever.   aspirin EC 81 MG tablet Take 1 tablet (81 mg total) by mouth daily.  Swallow whole.   atorvastatin (LIPITOR) 40 MG tablet Take 1 tablet (40 mg total) by mouth daily.   B Complex-C-Folic Acid (DIALYVITE 800) 0.8 MG TABS Take 1 tablet by mouth daily.   cinacalcet (SENSIPAR) 30 MG tablet Take 30 mg by mouth daily.   insulin isophane & regular human KwikPen (NOVOLIN 70/30 KWIKPEN) (70-30) 100 UNIT/ML KwikPen Inject 30U in the morning and 25U in the evening   Insulin Pen Needle 32G X 4 MM MISC Use with insulin pen.   lidocaine-prilocaine (EMLA) cream Apply 1 Application topically Every Tuesday,Thursday,and Saturday with dialysis.   midodrine (PROAMATINE) 10 MG tablet Take 1 tablet (10 mg total) by mouth 3 (three) times daily with meals.   pantoprazole (PROTONIX) 40 MG tablet Take 1 tablet (40 mg total) by mouth 2 (two) times daily.   sevelamer carbonate (RENVELA) 800 MG tablet Take 2,400-4,000 mg by mouth See admin instructions. Take 5 tablets (4000mg ) three times daily with meals and 3 tablets (2400mg ) twice daily with snacks.   Physical Exam:    VS:  BP (!) 78/48 (BP Location: Right Arm)   Pulse 83   Temp 98.4 F (36.9 C)   Ht 6\' 1"  (1.854 m)   Wt 233 lb (105.7 kg)   SpO2  96% Comment: 2 L [place]  BMI 30.74 kg/m    Wt Readings from Last 3 Encounters:  01/12/24 233 lb (105.7 kg)  12/15/23 218 lb 0.6 oz (98.9 kg)  10/26/23 243 lb 9.7 oz (110.5 kg)   GEN: Well nourished, well developed, drowsy NECK: No JVD; No carotid bruits CARDIAC: RRR, 1/3 SEM, no rubs or gallops RESPIRATORY:  Clear to auscultation without rales, wheezing or rhonchi  ABDOMEN: Soft, non-tender, non-distended EXTREMITIES:  No edema; R BKA  Asessement and Plan:Marland Kitchen    Hypotension: Blood pressure difficult to take upon patient arrival.  After multiple attempts with automatic and manual blood pressure cuff was able to get a reading of 78/48.  Blood sugar 118. Patient reported that he was dizzy, lightheaded and had a headache.  Patient was very drowsy during visit, regularly falling asleep while  talking.  Discussed with Dr. [place], DOD at [facility] office today who recommended patient present to the emergency room for further evaluation given recent NSTEMI and GI bleeding.  Patient initially was adamant that he would not present to the emergency room, after further discussion he agreed and EMS transport was called.  Patient was placed on 2 L nasal cannula after his oxygen saturation dropped to 88%.  Upon EMS arrival patient coughed hard and became unresponsive for under 10 seconds, he then became alert and blood pressure improved after he was moved to stretcher, systolic 88.  Patient is currently listed as taking midodrine 3 times a day, it is unclear if he has been taking as patient was inconsistent with his answers during visit, was poor historian.  Suspected NSTEMI: Patient with prior nonobstructive CAD.  Cardiac catheterization in [place] in 2019 due to LV dysfunction, EF 25% found to have 30% stenosis in proximal LAD and distal RCA.  Was medically managed at this time.  He underwent nuclear stress test in 05/2021 through [facility], "There is a moderate sized, severe, perfusion defect involving the apical, apical inferior, mid inferior, and basal inferior segments. The defects of the involved segments are nearly completely reversible. This is consistent with probable ischemia." He was again managed medically. Patient admitted on 10/2021 following an episode of chest pain, hypotension and near syncope during HD.  Initial blood pressure was 75/59, troponin was significantly elevated and peaked at 8942. During admission patient had trial of DAPT and after single dose of Plavix with aspirin patient had profuse rectal bleeding, given GI bleeding ischemic evaluation was deferred.  Per prior admission notes patient was not a candidate for invasive intervention as he had been unable to tolerate DAPT given recurrent GI bleeding.  Recommended to continue managing medically. Today patient reports that he has not had  any chest pain or shortness of breath.  EKG reviewed with Dr. [place], not felt to have any significant changes.  Patient to present to the emergency room as noted above for symptomatic hypotension. Continue aspirin 81 mg daily and atorvastatin 40 mg daily.  Chronic HFrEF/moderate to severe aortic valve stenosis: Echocardiogram on 11/09/2023 indicated EF of 30 to 35% with regional wall motion abnormalities, grade 3 diastolic dysfunction, mildly reduced RV systolic function, moderate to severe aortic valve stenosis.  Patient noted to not be a candidate for any invasive evaluation as he is not a DAPT candidate given GI bleeding.  Per admission notes aortic valve stenosis will need to be medically managed given multiple comorbidities and medical issues.  GDMT has been limited by low blood pressure and ESRD.  He is on midodrine for  blood pressure support. Blood pressure today 78/48.  There was significant difficulty obtaining manual and automatic blood pressure when first arriving to office.  Unclear if patient has been taking midodrine as he gave inconsistent answers during office visit.  GI bleeding: During admission in January patient noted to have GI bleeding after starting on DAPT.  Following with GI.  On aspirin 81 mg daily.   ESRD: On dialysis Monday, Wednesday and Friday.  Last dialysis session was yesterday.  Followed by nephrology.    Disposition: Patient to present to the emergency department via EMS.  Signed, Rip Harbour, NP

## 2024-01-12 ENCOUNTER — Other Ambulatory Visit: Payer: Self-pay

## 2024-01-12 ENCOUNTER — Observation Stay (HOSPITAL_COMMUNITY)
Admission: EM | Admit: 2024-01-12 | Discharge: 2024-01-13 | Disposition: A | Discharge status: Home / Self Care | Payer: Medicare Other | Attending: Physician Assistant | Admitting: Physician Assistant

## 2024-01-12 ENCOUNTER — Encounter (HOSPITAL_COMMUNITY): Payer: Self-pay

## 2024-01-12 ENCOUNTER — Encounter: Payer: Self-pay | Admitting: Cardiology

## 2024-01-12 ENCOUNTER — Ambulatory Visit (INDEPENDENT_AMBULATORY_CARE_PROVIDER_SITE_OTHER): Payer: Medicare Other | Admitting: Cardiology

## 2024-01-12 VITALS — BP 78/48 | HR 83 | Temp 98.4°F | Ht 73.0 in | Wt 233.0 lb

## 2024-01-12 DIAGNOSIS — I214 Non-ST elevation (NSTEMI) myocardial infarction: Secondary | ICD-10-CM | POA: Insufficient documentation

## 2024-01-12 DIAGNOSIS — Z79899 Other long term (current) drug therapy: Secondary | ICD-10-CM | POA: Insufficient documentation

## 2024-01-12 DIAGNOSIS — K219 Gastro-esophageal reflux disease without esophagitis: Secondary | ICD-10-CM | POA: Insufficient documentation

## 2024-01-12 DIAGNOSIS — Z89511 Acquired absence of right leg below knee: Secondary | ICD-10-CM | POA: Diagnosis not present

## 2024-01-12 DIAGNOSIS — Z992 Dependence on renal dialysis: Secondary | ICD-10-CM | POA: Insufficient documentation

## 2024-01-12 DIAGNOSIS — I350 Nonrheumatic aortic (valve) stenosis: Secondary | ICD-10-CM | POA: Insufficient documentation

## 2024-01-12 DIAGNOSIS — I2510 Atherosclerotic heart disease of native coronary artery without angina pectoris: Secondary | ICD-10-CM | POA: Insufficient documentation

## 2024-01-12 DIAGNOSIS — I428 Other cardiomyopathies: Secondary | ICD-10-CM | POA: Diagnosis not present

## 2024-01-12 DIAGNOSIS — R7989 Other specified abnormal findings of blood chemistry: Secondary | ICD-10-CM | POA: Diagnosis not present

## 2024-01-12 DIAGNOSIS — Z8719 Personal history of other diseases of the digestive system: Secondary | ICD-10-CM | POA: Diagnosis not present

## 2024-01-12 DIAGNOSIS — E1122 Type 2 diabetes mellitus with diabetic chronic kidney disease: Secondary | ICD-10-CM | POA: Insufficient documentation

## 2024-01-12 DIAGNOSIS — Z794 Long term (current) use of insulin: Secondary | ICD-10-CM | POA: Diagnosis not present

## 2024-01-12 DIAGNOSIS — I5022 Chronic systolic (congestive) heart failure: Secondary | ICD-10-CM | POA: Insufficient documentation

## 2024-01-12 DIAGNOSIS — I951 Orthostatic hypotension: Principal | ICD-10-CM | POA: Diagnosis present

## 2024-01-12 DIAGNOSIS — I959 Hypotension, unspecified: Secondary | ICD-10-CM | POA: Insufficient documentation

## 2024-01-12 DIAGNOSIS — Z6830 Body mass index (BMI) 30.0-30.9, adult: Secondary | ICD-10-CM | POA: Insufficient documentation

## 2024-01-12 DIAGNOSIS — Z7901 Long term (current) use of anticoagulants: Secondary | ICD-10-CM | POA: Diagnosis not present

## 2024-01-12 DIAGNOSIS — N186 End stage renal disease: Secondary | ICD-10-CM | POA: Insufficient documentation

## 2024-01-12 DIAGNOSIS — I132 Hypertensive heart and chronic kidney disease with heart failure and with stage 5 chronic kidney disease, or end stage renal disease: Secondary | ICD-10-CM | POA: Diagnosis not present

## 2024-01-12 DIAGNOSIS — E669 Obesity, unspecified: Secondary | ICD-10-CM | POA: Insufficient documentation

## 2024-01-12 DIAGNOSIS — R55 Syncope and collapse: Secondary | ICD-10-CM | POA: Diagnosis present

## 2024-01-12 DIAGNOSIS — I5023 Acute on chronic systolic (congestive) heart failure: Secondary | ICD-10-CM

## 2024-01-12 LAB — BASIC METABOLIC PANEL
Anion gap: 17 — ABNORMAL HIGH (ref 5–15)
BUN: 25 mg/dL — ABNORMAL HIGH (ref 8–23)
CO2: 23 mmol/L (ref 22–32)
Calcium: 9 mg/dL (ref 8.9–10.3)
Chloride: 94 mmol/L — ABNORMAL LOW (ref 98–111)
Creatinine, Ser: 7.76 mg/dL — ABNORMAL HIGH (ref 0.61–1.24)
GFR, Estimated: 7 mL/min — ABNORMAL LOW (ref >60)
Glucose, Bld: 139 mg/dL — ABNORMAL HIGH (ref 70–99)
Potassium: 4 mmol/L (ref 3.5–5.1)
Sodium: 134 mmol/L — ABNORMAL LOW (ref 135–145)

## 2024-01-12 LAB — CBC WITH DIFFERENTIAL/PLATELET
Abs Immature Granulocytes: 0.02 10*3/uL (ref 0.00–0.07)
Basophils Absolute: 0.1 10*3/uL (ref 0.0–0.1)
Basophils Relative: 1 %
Eosinophils Absolute: 0.3 10*3/uL (ref 0.0–0.5)
Eosinophils Relative: 5 %
HCT: 41.5 % (ref 39.0–52.0)
Hemoglobin: 12.9 g/dL — ABNORMAL LOW (ref 13.0–17.0)
Immature Granulocytes: 0 %
Lymphocytes Relative: 23 %
Lymphs Abs: 1.4 10*3/uL (ref 0.7–4.0)
MCH: 28.7 pg (ref 26.0–34.0)
MCHC: 31.1 g/dL (ref 30.0–36.0)
MCV: 92.2 fL (ref 80.0–100.0)
Monocytes Absolute: 1.1 10*3/uL — ABNORMAL HIGH (ref 0.1–1.0)
Monocytes Relative: 17 %
Neutro Abs: 3.3 10*3/uL (ref 1.7–7.7)
Neutrophils Relative %: 54 %
Platelets: 199 10*3/uL (ref 150–400)
RBC: 4.5 MIL/uL (ref 4.22–5.81)
RDW: 18.1 % — ABNORMAL HIGH (ref 11.5–15.5)
WBC: 6.2 10*3/uL (ref 4.0–10.5)
nRBC: 0 % (ref 0.0–0.2)

## 2024-01-12 LAB — CBG MONITORING, ED
Glucose-Capillary: 177 mg/dL — ABNORMAL HIGH (ref 70–99)
Glucose-Capillary: 242 mg/dL — ABNORMAL HIGH (ref 70–99)

## 2024-01-12 LAB — TROPONIN I (HIGH SENSITIVITY)
Troponin I (High Sensitivity): 81 ng/L — ABNORMAL HIGH (ref <18)
Troponin I (High Sensitivity): 88 ng/L — ABNORMAL HIGH (ref <18)

## 2024-01-12 LAB — BRAIN NATRIURETIC PEPTIDE: B Natriuretic Peptide: 1106.3 pg/mL — ABNORMAL HIGH (ref 0.0–100.0)

## 2024-01-12 MED ORDER — PANTOPRAZOLE SODIUM 40 MG PO TBEC
40.0000 mg | DELAYED_RELEASE_TABLET | Freq: Two times a day (BID) | ORAL | Status: DC
  Administered 2024-01-12 – 2024-01-13 (×2): 40 mg via ORAL
  Filled 2024-01-12 (×2): qty 1

## 2024-01-12 MED ORDER — MIDODRINE HCL 5 MG PO TABS
10.0000 mg | ORAL_TABLET | Freq: Three times a day (TID) | ORAL | Status: DC
  Administered 2024-01-12 – 2024-01-13 (×3): 10 mg via ORAL
  Filled 2024-01-12 (×3): qty 2

## 2024-01-12 MED ORDER — LIDOCAINE-PRILOCAINE 2.5-2.5 % EX CREA: 1.0000 | TOPICAL_CREAM | CUTANEOUS | Status: DC

## 2024-01-12 MED ORDER — RENA-VITE PO TABS
1.0000 | ORAL_TABLET | Freq: Every day | ORAL | Status: DC
  Filled 2024-01-12: qty 1

## 2024-01-12 MED ORDER — INSULIN ASPART 100 UNIT/ML IJ SOLN
0.0000 [IU] | Freq: Three times a day (TID) | INTRAMUSCULAR | Status: DC
Start: 2024-01-12 — End: 2024-01-13
  Administered 2024-01-12: 1 [IU] via SUBCUTANEOUS
  Administered 2024-01-13: 4 [IU] via SUBCUTANEOUS

## 2024-01-12 MED ORDER — CINACALCET HCL 30 MG PO TABS
30.0000 mg | ORAL_TABLET | Freq: Every day | ORAL | Status: DC
  Administered 2024-01-13: 30 mg via ORAL
  Filled 2024-01-12: qty 1

## 2024-01-12 MED ORDER — INFLUENZA VAC A&B SURF ANT ADJ 0.5 ML IM SUSY
0.5000 mL | PREFILLED_SYRINGE | INTRAMUSCULAR | Status: DC
  Filled 2024-01-12: qty 0.5

## 2024-01-12 MED ORDER — SEVELAMER CARBONATE 800 MG PO TABS: 2400.0000 mg | ORAL_TABLET | ORAL | Status: DC

## 2024-01-12 MED ORDER — ACETAMINOPHEN 325 MG PO TABS: 650.0000 mg | ORAL_TABLET | Freq: Four times a day (QID) | ORAL | Status: DC | PRN

## 2024-01-12 MED ORDER — ATORVASTATIN CALCIUM 40 MG PO TABS
40.0000 mg | ORAL_TABLET | Freq: Every day | ORAL | Status: DC
  Administered 2024-01-12 – 2024-01-13 (×2): 40 mg via ORAL
  Filled 2024-01-12 (×2): qty 1

## 2024-01-12 MED ORDER — ALBUTEROL SULFATE (2.5 MG/3ML) 0.083% IN NEBU: 2.5000 mg | INHALATION_SOLUTION | Freq: Four times a day (QID) | RESPIRATORY_TRACT | Status: DC | PRN

## 2024-01-12 MED ORDER — SODIUM CHLORIDE 0.9% FLUSH
3.0000 mL | Freq: Two times a day (BID) | INTRAVENOUS | Status: DC
  Administered 2024-01-12 – 2024-01-13 (×2): 3 mL via INTRAVENOUS

## 2024-01-12 MED ORDER — INSULIN ASPART PROT & ASPART (70-30 MIX) 100 UNIT/ML ~~LOC~~ SUSP
25.0000 [IU] | Freq: Every day | SUBCUTANEOUS | Status: DC
  Filled 2024-01-12: qty 10

## 2024-01-12 MED ORDER — INSULIN ISOPHANE & REGULAR (HUMAN 70-30)100 UNIT/ML KWIKPEN
25.0000 [IU] | PEN_INJECTOR | Freq: Two times a day (BID) | SUBCUTANEOUS | Status: DC
Start: 2024-01-12 — End: 2024-01-12

## 2024-01-12 MED ORDER — INSULIN ASPART PROT & ASPART (70-30 MIX) 100 UNIT/ML ~~LOC~~ SUSP
30.0000 [IU] | Freq: Every day | SUBCUTANEOUS | Status: DC
  Administered 2024-01-13: 30 [IU] via SUBCUTANEOUS
  Filled 2024-01-12: qty 10

## 2024-01-12 MED ORDER — ACETAMINOPHEN 650 MG RE SUPP: 650.0000 mg | Freq: Four times a day (QID) | RECTAL | Status: DC | PRN

## 2024-01-12 MED ORDER — HEPARIN SODIUM (PORCINE) 5000 UNIT/ML IJ SOLN
5000.0000 [IU] | Freq: Three times a day (TID) | INTRAMUSCULAR | Status: DC
  Administered 2024-01-12: 5000 [IU] via SUBCUTANEOUS
  Filled 2024-01-12 (×2): qty 1

## 2024-01-12 MED ORDER — SEVELAMER CARBONATE 800 MG PO TABS
4000.0000 mg | ORAL_TABLET | Freq: Three times a day (TID) | ORAL | Status: DC
  Administered 2024-01-12 – 2024-01-13 (×3): 4000 mg via ORAL
  Filled 2024-01-12 (×3): qty 5

## 2024-01-12 NOTE — H&P (Incomplete)
 History and Physical    Patient: [name] RODWELL Sr. ZOX:[id number] DOB: June 03, 1961 DOA: 01/12/2024 DOS: the patient was seen and examined on 01/12/2024 PCP: Pcp, No  Patient coming from: Via EMS  Chief Complaint:  Chief Complaint  Patient presents with   Hypotension   Syncopal Event   HPI: [name] FITZWATER Sr. is a 63 y.o. male with medical history significant of ESRD-HD (MWF), DM2, HTN, nonischemic cardiomyopathy, systolic CHF, prior right BKA who presents after passing out while at the doctor's office. Medical staff were unable to obtain a blood pressure reading because it was too low, leading to loss of consciousness. He is unsure of the duration of unconsciousness.    Review of records note patient had just recently been hospitalized from 12/22-1/28/2025 and initially after experiencing an episode of chest pain, hypotension and near syncope during hemodialysis.  His initial blood pressure was low in 75/59. Troponin was significantly elevated and peaked at 9000, he was diagnosed with NSTEMI, started on heparin drip.  His Hospital course was also complicated altered mental status, influenza, GI bleeding, norovirus diarrhea. He has had a gradual recovery and has been recommended to go to SNF by PT. Received HD in a chair 1/24.  He is being discharged to [facility].  Patient reports that he was there up until about 2 weeks ago when he was discharged home.  He was supposed to receive a 30-day supply of his medications, which he did not receive. He has been without some of his medications for about two weeks, although he continues to take his insulin, which is dosed at 30 units in the morning and 25 units at night. Daughter call the rehab He attempted to refill his prescriptions at [facility] in [place] yesterday, but has not yet picked up the medications.  One of those medications was midodrine.   No chest pain, swelling, dark or black stools, or open wounds.  He did had dialysis  yesterday.  He gone to his follow-up appointment with cardiology today.   Patient in the emergency department patient was noted to be afebrile with blood pressures as low as 78/48.  Labs noted WBC 12.9, potassium 4, BUN 25, creatinine 7.76  Review of Systems: {ROS_Text:26778} Past Medical History:  Diagnosis Date   ESRD on hemodialysis (HCC)    a. HD MWF > 10 years.   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 01/2022 Echo: EF 45-50%, glob HK, mod LVH, mildly reduced RV fxn, mild MR, mod-sev MV Ca2+. Mild AS; b. 10/2023 Echo: EF 45-50%, glob HK, GrII DD, mildly reduced RV fxn, RVSP 52.65mmHg. Mild MR/MS, mod TR.   Hypertension    Nonischemic cardiomyopathy (HCC)    a. 2019 Cath [facility]): Nonobstructive disease; b. 12/2019 Echo: EF 50%; c. 12/2019 MV (Tx w/u [facility]): Ant infarct w/ peri-infarct ischemia. EF 35%; c. 05/2021 MV (Tx w/u [facility]): Cor Ca2+, EF 53%, motion artifact, equiv study, likely low risk.   Nonobstructive coronary artery disease    a.  2019 Cath ([place]): Left main normal, LAD 30 proximal, RCA 30 distal, EF 25%.   Obesity    Type 2 diabetes mellitus (HCC)    a. 10/2023 - admission w/ HHS.   Past Surgical History:  Procedure Laterality Date   A/V FISTULAGRAM N/A 08/15/2019   Procedure: A/V FISTULAGRAM;  Surgeon: Maeola Harman, MD;  Location: [facility] INVASIVE CV LAB;  Service: Cardiovascular;  Laterality: N/A;   AMPUTATION Right 02/02/2022  Procedure: AMPUTATION 5th RAY;  Surgeon: Louann Sjogren, MD;  Location: [facility] OR;  Service: Podiatry;  Laterality: Right;   AMPUTATION Right 05/23/2022   Procedure: RIGHT BELOW KNEE AMPUTATION;  Surgeon: Nadara Mustard, MD;  Location: [facility] OR;  Service: Orthopedics;  Laterality: Right;   AMPUTATION Right 07/04/2022   Procedure: REVISION AMPUTATION BELOW KNEE;  Surgeon: Nadara Mustard, MD;  Location: [facility] OR;  Service: Orthopedics;  Laterality: Right;   APPLICATION OF WOUND VAC  05/23/2022   Procedure:  APPLICATION OF WOUND VAC;  Surgeon: Nadara Mustard, MD;  Location: [facility] OR;  Service: Orthopedics;;   AV FISTULA PLACEMENT     CARDIAC CATHETERIZATION  02/17/2018   LHC 02/17/18 ([name]): 30% pLAD, 30% dRCA, LVEDP 25-30, LVEF 25%.     CATARACT EXTRACTION W/PHACO Right 03/29/2020   Procedure: CATARACT EXTRACTION PHACO AND INTRAOCULAR LENS PLACEMENT (IOC)  RIGHT DIABETIC;  Surgeon: Lockie Mola, MD;  Location: [facility] ORS;  Service: Ophthalmology;  Laterality: Right;  Lot # [id number] H [place]: 02:30.9 AP% 9.5% CDE: 14.38   EYE SURGERY Right    cataract removed   [name] N/A 12/02/2023   Procedure: [name];  Surgeon: Imogene Burn, MD;  Location: [facility] ENDOSCOPY;  Service: Gastroenterology;  Laterality: N/A;   I & D EXTREMITY Right 02/02/2022   Procedure: IRRIGATION AND DEBRIDEMENT RIGHT FOOT;  Surgeon: Louann Sjogren, MD;  Location: [facility] OR;  Service: Podiatry;  Laterality: Right;   INCISION AND DRAINAGE OF WOUND Right 02/08/2022   Procedure: IRRIGATION AND DEBRIDEMENT WOUND;  Surgeon: Asencion Islam, DPM;  Location: [facility] OR;  Service: Podiatry;  Laterality: Right;  with removal of infected bone    INSERTION OF DIALYSIS CATHETER  08/13/2019   Procedure: Insertion Of Dialysis Catheter;  Surgeon: Larina Earthly, MD;  Location: [facility] OR;  Service: Vascular;;   INSERTION OF DIALYSIS CATHETER Left 07/19/2020   Procedure: INSERTION OF DIALYSIS CATHETER;  Surgeon: Maeola Harman, MD;  Location: [facility] OR;  Service: Vascular;  Laterality: Left;   IR LUMBAR PUNCTURE  11/20/2023   IR US GUIDE BX ASP/DRAIN  11/07/2022   IRRIGATION AND DEBRIDEMENT ABSCESS     PERIPHERAL VASCULAR BALLOON ANGIOPLASTY  08/15/2019   Procedure: PERIPHERAL VASCULAR BALLOON ANGIOPLASTY;  Surgeon: Maeola Harman, MD;  Location: [facility] INVASIVE CV LAB;  Service: Cardiovascular;;   PERIPHERAL VASCULAR BALLOON ANGIOPLASTY Left 02/06/2022   Procedure: PERIPHERAL VASCULAR BALLOON ANGIOPLASTY;  Surgeon:  Cephus Shelling, MD;  Location: [facility] INVASIVE CV LAB;  Service: Cardiovascular;  Laterality: Left;   RADIOLOGY WITH ANESTHESIA N/A 11/20/2023   Procedure: MRI WITH ANESTHESIA;  Surgeon: Radiologist, Medication, MD;  Location: [facility] OR;  Service: Radiology;  Laterality: N/A;   RADIOLOGY WITH ANESTHESIA N/A 11/20/2023   Procedure: IR WITH ANESTHESIA;  Surgeon: Gilmer Mor, DO;  Location: [facility] OR;  Service: Anesthesiology;  Laterality: N/A;  PLEASE OBTAIN OPENING PRESSURE AND DRAIN AT LEAST 30CC'S OF CSF.   REVISON OF ARTERIOVENOUS FISTULA Left 07/19/2020   Procedure: REVISON/PLICATION OF ARTERIOVENOUS FISTULA LEFT;  Surgeon: Maeola Harman, MD;  Location: [facility] OR;  Service: Vascular;  Laterality: Left;   THROMBECTOMY AND REVISION OF ARTERIOVENTOUS (AV) GORETEX  GRAFT Left 08/13/2019   Procedure: THROMBECTOMY OF LEFT ARM ARTERIOVENTOUS (AV) FISTULA;  Surgeon: Larina Earthly, MD;  Location: [facility] OR;  Service: Vascular;  Laterality: Left;   TOE AMPUTATION     Social History:  reports that he has never smoked. He has never used smokeless tobacco. He reports that he does  not currently use alcohol. He reports that he does not use drugs.  Allergies  Allergen Reactions   Sulfa Antibiotics Diarrhea and Nausea And Vomiting    Family History  Problem Relation Age of Onset   Hypertension Mother     Prior to Admission medications   Medication Sig Start Date End Date Taking? Authorizing Provider  acetaminophen (TYLENOL) 500 MG tablet Take 1,000 mg by mouth 2 (two) times daily as needed for moderate pain (pain score 4-6), headache or fever.   Yes [provider]  B Complex-C-Folic Acid (DIALYVITE 800) 0.8 MG TABS Take 1 tablet by mouth daily.   Yes [provider]  cinacalcet (SENSIPAR) 30 MG tablet Take 30 mg by mouth daily.   Yes [provider]  insulin isophane & regular human KwikPen (NOVOLIN 70/30 KWIKPEN) (70-30) 100 UNIT/ML KwikPen Inject 30U in the morning and 25U in the  evening 10/27/23  Yes Briant Cedar, MD  Insulin Pen Needle 32G X 4 MM MISC Use with insulin pen. 10/28/23  Yes Briant Cedar, MD  lidocaine-prilocaine (EMLA) cream Apply 1 Application topically every Monday, Wednesday, and Friday with hemodialysis.   Yes [provider]  sevelamer carbonate (RENVELA) 800 MG tablet Take 2,400-5,600 mg by mouth See admin instructions. Take 7 tablets (4000mg ) three times daily with meals and 3 tablets (2400mg ) twice daily with snacks.   Yes [provider]  aspirin EC 81 MG tablet Take 1 tablet (81 mg total) by mouth daily. Swallow whole. Patient not taking: Reported on 01/12/2024 12/15/23   Hughie Closs, MD  atorvastatin (LIPITOR) 40 MG tablet Take 1 tablet (40 mg total) by mouth daily. Patient not taking: Reported on 01/12/2024 12/15/23 01/14/24  Hughie Closs, MD  midodrine (PROAMATINE) 10 MG tablet Take 1 tablet (10 mg total) by mouth 3 (three) times daily with meals. Patient not taking: Reported on 01/12/2024 12/15/23 01/14/24  Hughie Closs, MD  pantoprazole (PROTONIX) 40 MG tablet Take 1 tablet (40 mg total) by mouth 2 (two) times daily. Patient not taking: Reported on 01/12/2024 12/15/23 01/26/24  Hughie Closs, MD    Physical Exam: Vitals:   01/12/24 1220 01/12/24 1449  BP: 102/62 131/63  Pulse: 83 87  Resp: 18 17  Temp: 98.2 F (36.8 C)   TempSrc: Oral   SpO2: 95% 100%    Constitutional: NAD, calm, comfortable Eyes: PERRL, lids and conjunctivae normal ENMT: Mucous membranes are moist. Posterior pharynx clear of any exudate or lesions.Normal dentition.  Neck: normal, supple, no masses, no thyromegaly Respiratory: clear to auscultation bilaterally, no wheezing, no crackles. Normal respiratory effort. No accessory muscle use.  Cardiovascular: Regular rate and rhythm, no murmurs / rubs / gallops. No extremity edema. 2+ pedal pulses. No carotid bruits.  Abdomen: no tenderness, no masses palpated. No hepatosplenomegaly. Bowel  sounds positive.  Musculoskeletal: no clubbing / cyanosis. No joint deformity upper and lower extremities. Good ROM, no contractures. Normal muscle tone.  Skin: no rashes, lesions, ulcers. No induration Neurologic: CN 2-12 grossly intact. Sensation intact, DTR normal. Strength 5/5 in all 4.  Psychiatric: Normal judgment and insight. Alert and oriented x 3. Normal mood.   Data Reviewed: {Tip this will not be part of the note when signed- Document your independent interpretation of telemetry tracing, EKG, lab, Radiology test or any other diagnostic tests. Add any new diagnostic test ordered today. (Optional):26781} Sinus rhythm with first-degree AV block with premature atrial complexes at 83 bpm.  Reviewed labs, imaging, and pertinent records as documented.  Assessment and Plan:  Syncope secondary to orthostatic hypotension Patient is supposed to been on midodrine 10 mg 3 times daily, but after getting discharged from rehab was not given medications as he initially thought he would be.  Patient was able to get those medications refill and they are currently waiting at pharmacy. -Admit to a telemetry bed -Resume midodrine -Check orthostatic vital sign   Elevated troponin Chronic.  Patient denies any reports of chest pain.  High-sensitivity troponins 81->88.  ESRD on HD Patient normally dialyzes Monday, Wednesday, Friday.  Last hemodialysis session on 2/24.   Diabetes mellitus type 2 with long-term use of insulin Last seen today noted to be greater than 15.5 when checked on 10/24/2023.  Patient home insulin regimen includes NovoLog 70/30 mix 30 units every morning and 20 units nightly -Hypoglycemic protocols -Continue home insulin regimen of 7030 -CBGs before every meal with additional very sensitive SSI -Adjust insulin regimen as needed   Right BKA    GERD  History of GIB -Continue Protonix  DVT prophylaxis: Heparin  Advance Care Planning:   Code Status: Prior ***  Consults:  ***  Family Communication: ***  Severity of Illness: {Observation/Inpatient:21159}  Author: Clydie Braun, MD 01/12/2024 4:00 PM  For on call review [email or web address].

## 2024-01-12 NOTE — ED Notes (Signed)
Pt given food/drink.

## 2024-01-12 NOTE — ED Provider Notes (Addendum)
 [place] EMERGENCY DEPARTMENT AT [facility] Provider Note   CSN: [id number] Arrival date & time: 01/12/24  1211     History  Chief Complaint  Patient presents with   Hypotension   Syncopal Event    [name] GOEKEN Sr. is a 63 y.o. male history of ESRD on dialysis Tuesday Thursday Saturday, heart failure, hypertension, severe aortic stenosis, nonischemic cardiomyopathy, NSTEMI, diabetes presented after a witnessed syncopal event with hypotension.  Patient states he was recently admitted and discharged for an NSTEMI and was following up with his cardiologist and states that he was told he had a syncopal episode.  Patient denies losing consciousness however states that his cardiologist on lose consciousness.  Patient denies chest pain, shortness of breath, leg swelling.  Patient went to dialysis yesterday without issues.  Patient states he has not been able to pick up medication since he was discharged including his midodrine.  Home Medications Prior to Admission medications   Medication Sig Start Date End Date Taking? Authorizing Provider  Insulin Pen Needle 32G X 4 MM MISC Use with insulin pen. 10/28/23  Yes Briant Cedar, MD  acetaminophen (TYLENOL) 500 MG tablet Take 1,000 mg by mouth 2 (two) times daily as needed for moderate pain (pain score 4-6), headache or fever.    [provider]  aspirin EC 81 MG tablet Take 1 tablet (81 mg total) by mouth daily. Swallow whole. 12/15/23   Hughie Closs, MD  atorvastatin (LIPITOR) 40 MG tablet Take 1 tablet (40 mg total) by mouth daily. 12/15/23 01/14/24  Hughie Closs, MD  B Complex-C-Folic Acid (DIALYVITE 800) 0.8 MG TABS Take 1 tablet by mouth daily.    [provider]  cinacalcet (SENSIPAR) 30 MG tablet Take 30 mg by mouth daily.    [provider]  insulin isophane & regular human KwikPen (NOVOLIN 70/30 KWIKPEN) (70-30) 100 UNIT/ML KwikPen Inject 30U in the morning and 25U in the evening 10/27/23    Briant Cedar, MD  lidocaine-prilocaine (EMLA) cream Apply 1 Application topically Every Tuesday,Thursday,and Saturday with dialysis.    [provider]  midodrine (PROAMATINE) 10 MG tablet Take 1 tablet (10 mg total) by mouth 3 (three) times daily with meals. 12/15/23 01/14/24  Hughie Closs, MD  pantoprazole (PROTONIX) 40 MG tablet Take 1 tablet (40 mg total) by mouth 2 (two) times daily. 12/15/23 01/26/24  Hughie Closs, MD  sevelamer carbonate (RENVELA) 800 MG tablet Take 2,400-4,000 mg by mouth See admin instructions. Take 5 tablets (4000mg ) three times daily with meals and 3 tablets (2400mg ) twice daily with snacks.    [provider]      Allergies    Sulfa antibiotics    Review of Systems   Review of Systems  Physical Exam Updated Vital Signs BP 131/63 (BP Location: Right Arm)   Pulse 87   Temp 98.2 F (36.8 C) (Oral)   Resp 17   SpO2 100%  Physical Exam Vitals reviewed.  Constitutional:      General: He is not in acute distress. HENT:     Head: Normocephalic and atraumatic.  Eyes:     Extraocular Movements: Extraocular movements intact.     Conjunctiva/sclera: Conjunctivae normal.     Pupils: Pupils are equal, round, and reactive to light.  Cardiovascular:     Rate and Rhythm: Normal rate and regular rhythm.     Pulses: Normal pulses.     Heart sounds: Normal heart sounds.     Comments: 2+ bilateral  radial/dorsalis pedis pulses with regular rate Pulmonary:     Effort: Pulmonary effort is normal. No respiratory distress.     Breath sounds: Normal breath sounds.  Abdominal:     Palpations: Abdomen is soft.     Tenderness: There is no abdominal tenderness. There is no guarding or rebound.  Musculoskeletal:        General: Normal range of motion.     Cervical back: Normal range of motion and neck supple.     Comments: 5 out of 5 bilateral grip/leg extension strength  Skin:    General: Skin is warm and dry.     Capillary Refill: Capillary  refill takes less than 2 seconds.  Neurological:     General: No focal deficit present.     Mental Status: He is alert and oriented to person, place, and time.     Comments: Sensation intact in all 4 limbs  Psychiatric:        Mood and Affect: Mood normal.     ED Results / Procedures / Treatments   Labs (all labs ordered are listed, but only abnormal results are displayed) Labs Reviewed  BASIC METABOLIC PANEL - Abnormal; Notable for the following components:      Result Value   Sodium 134 (*)    Chloride 94 (*)    Glucose, Bld 139 (*)    BUN 25 (*)    Creatinine, Ser 7.76 (*)    GFR, Estimated 7 (*)    Anion gap 17 (*)    All other components within normal limits  CBC WITH DIFFERENTIAL/PLATELET - Abnormal; Notable for the following components:   Hemoglobin 12.9 (*)    RDW 18.1 (*)    Monocytes Absolute 1.1 (*)    All other components within normal limits  BRAIN NATRIURETIC PEPTIDE - Abnormal; Notable for the following components:   B Natriuretic Peptide 1,106.3 (*)    All other components within normal limits  TROPONIN I (HIGH SENSITIVITY) - Abnormal; Notable for the following components:   Troponin I (High Sensitivity) 81 (*)    All other components within normal limits  TROPONIN I (HIGH SENSITIVITY)    EKG None  Radiology No results found.  Procedures Procedures    Medications Ordered in ED Medications - No data to display  ED Course/ Medical Decision Making/ A&P                                 Medical Decision Making Amount and/or Complexity of Data Reviewed Labs: ordered.  Risk Decision regarding hospitalization.   [name] Lance Sr. 63 y.o. presented today for syncope. Working DDx that I considered at this time includes, but not limited to, orthostatic hypotension vs vasovagal episode, arrhythmia, AS, ACS, PE, Pleural Effusion, Aortic Dissection, SAH, CVA/TIA, Vertebrobasilar Insufficiency, GIB, Trauma, ectopic, OD, Sepsis, UTI, electrolyte  abnormalities, anemia.  R/o DDx: orthostatic hypotension vs vasovagal episode, arrhythmia, AS, ACS, PE, Pleural Effusion, Aortic Dissection, SAH, CVA/TIA, Vertebrobasilar Insufficiency, GIB, Trauma, ectopic, OD, Sepsis, UTI, anemia: These are considered less likely due to history of present illness, physical exam, labs/imaging findings  Review of prior external notes: 01/12/2024 office visit  Unique Tests and My Independent Interpretation:  CBC: Unremarkable BMP: Anion gap 17, creatinine 7.76 EKG: Sinus 85 bpm, left bundle branch block, no signs of sgarbossa criteria met Troponin: 81 BNP: 1106.3  Social Determinants of Health: none  Discussion with Independent Historian: None  Discussion  of Management of Tests:  Katrinka Blazing, MD Hospitalist  Risk: High: hospitalization or escalation of hospital-level care  Risk Stratification Score: None  Staffed with Adela Lank, DO  Plan: On exam patient was no acute distress with stable vitals. Physical exam showed no acute findings. The cardiac monitor was ordered secondary to the patient's history of syncope and to monitor the patient for dysrhythmia. Cardiac monitor by my independent interpretation showed sinus with left bundle branch block.  Patient denies syncopized then however cardiology noted shows the patient had syncopal event.  Patient does have significant cardiac history and do suspect his hypotension in the office in the 70s is most likely because he was not able to pick up his midodrine.  Will obtain labs.  Do feel patient would benefit from admission depending on labs as patient does have severe aortic stenosis so concern for possible cardiogenic cause for syncope.  Also cannot give fluids as patient is ESRD on dialysis along with his aortic stenosis so we will hold off of fluids to avoid flash pulmonary edema.  Patient's labs do show elevated creatinine along with anion gap which I suspect is most likely due to patient's hypotension.  Rest of labs  are reassuring as they are lower than patient's baseline in terms of troponin and BNP.  I am concerned the patient may have had a syncopal episode caused by his aortic stenosis and could have had cardiogenic syncope and spoke to the patient about having him observed overnight to which patient agreed.  Will consult hospitalist.  I spoke to the hospitalist and patient was accepted for admission.  This chart was dictated using voice recognition software.  Despite best efforts to proofread,  errors can occur which can change the documentation meaning.        Final Clinical Impression(s) / ED Diagnoses Final diagnoses:  Hypotension, unspecified hypotension type  Syncope, unspecified syncope type    Rx / DC Orders ED Discharge Orders     None         [name] [name], [name] [name] 01/12/24 1506    Netta Corrigan, PA-C 01/12/24 1525    Melene Plan, DO 01/12/24 1533

## 2024-01-12 NOTE — ED Triage Notes (Signed)
 Pt BIB [facility] from cardiologist office d/t weakness, was slumping forward & hypotensive. When EMS arrived on scene he was in the 70's SBP & after a cough/sneeze had a witnessed syncopal event. He came back around a couple of seconds later & was assisted to lay down & his BP was 88/43, then after some fluids in his 20g Rt FA PIV his BP was 100/50. He was initially at the cardiologist for a follow-up from a recent NSTEMI, his EKG repeats were unremarkable. Upon arrival to ED pt A/Ox4, CBG 116 with Hx of DM.

## 2024-01-13 ENCOUNTER — Other Ambulatory Visit (HOSPITAL_COMMUNITY): Payer: Self-pay

## 2024-01-13 ENCOUNTER — Observation Stay (HOSPITAL_COMMUNITY): Payer: Medicare Other

## 2024-01-13 DIAGNOSIS — I951 Orthostatic hypotension: Secondary | ICD-10-CM | POA: Diagnosis not present

## 2024-01-13 LAB — RENAL FUNCTION PANEL
Albumin: 3.1 g/dL — ABNORMAL LOW (ref 3.5–5.0)
Anion gap: 13 (ref 5–15)
BUN: 34 mg/dL — ABNORMAL HIGH (ref 8–23)
CO2: 27 mmol/L (ref 22–32)
Calcium: 9.5 mg/dL (ref 8.9–10.3)
Chloride: 91 mmol/L — ABNORMAL LOW (ref 98–111)
Creatinine, Ser: 8.83 mg/dL — ABNORMAL HIGH (ref 0.61–1.24)
GFR, Estimated: 6 mL/min — ABNORMAL LOW (ref >60)
Glucose, Bld: 307 mg/dL — ABNORMAL HIGH (ref 70–99)
Phosphorus: 4.9 mg/dL — ABNORMAL HIGH (ref 2.5–4.6)
Potassium: 4.6 mmol/L (ref 3.5–5.1)
Sodium: 131 mmol/L — ABNORMAL LOW (ref 135–145)

## 2024-01-13 LAB — CBC
HCT: 41.4 % (ref 39.0–52.0)
Hemoglobin: 13.1 g/dL (ref 13.0–17.0)
MCH: 29 pg (ref 26.0–34.0)
MCHC: 31.6 g/dL (ref 30.0–36.0)
MCV: 91.8 fL (ref 80.0–100.0)
Platelets: 187 10*3/uL (ref 150–400)
RBC: 4.51 MIL/uL (ref 4.22–5.81)
RDW: 17.8 % — ABNORMAL HIGH (ref 11.5–15.5)
WBC: 5.5 10*3/uL (ref 4.0–10.5)
nRBC: 0 % (ref 0.0–0.2)

## 2024-01-13 LAB — CBG MONITORING, ED
Glucose-Capillary: 315 mg/dL — ABNORMAL HIGH (ref 70–99)
Glucose-Capillary: 137 mg/dL — ABNORMAL HIGH (ref 70–99)

## 2024-01-13 MED ORDER — ASPIRIN 81 MG PO TBEC
81.0000 mg | DELAYED_RELEASE_TABLET | Freq: Every day | ORAL | 2 refills | Status: DC
  Filled 2024-01-13: qty 30, 30d supply, fill #0

## 2024-01-13 MED ORDER — ATORVASTATIN CALCIUM 40 MG PO TABS
40.0000 mg | ORAL_TABLET | Freq: Every day | ORAL | 2 refills | Status: DC
  Filled 2024-01-13: qty 30, 30d supply, fill #0

## 2024-01-13 MED ORDER — PANTOPRAZOLE SODIUM 40 MG PO TBEC
40.0000 mg | DELAYED_RELEASE_TABLET | Freq: Two times a day (BID) | ORAL | 2 refills | Status: AC
  Filled 2024-01-13: qty 60, 30d supply, fill #0

## 2024-01-13 MED ORDER — MIDODRINE HCL 5 MG PO TABS
10.0000 mg | ORAL_TABLET | Freq: Three times a day (TID) | ORAL | 2 refills | Status: DC
Start: 2024-01-13 — End: 2024-02-17
  Filled 2024-01-13: qty 132, 22d supply, fill #0

## 2024-01-13 NOTE — Discharge Summary (Signed)
 [facility]  Physician Discharge Summary   Patient ID: [name] [name] Sr. MRN: [id number] DOB/AGE: March 01, 1961 63 y.o.  Admit date: 01/12/2024 Discharge date: 01/13/2024    PCP: Pcp, No  DISCHARGE DIAGNOSES:  Syncope secondary to orthostatic hypotension End-stage renal disease on hemodialysis Diabetes mellitus type 2  RECOMMENDATIONS FOR OUTPATIENT FOLLOW UP: Patient to continue with his outpatient dialysis.    Home Health: None Equipment/Devices: None  CODE STATUS: Full code  DISCHARGE CONDITION: fair  Diet recommendation: As before  INITIAL HISTORY: 63 y.o. male with medical history significant of ESRD-HD (MWF), DM2, HTN, nonischemic cardiomyopathy, systolic CHF, prior right BKA who presents after passing out while at the doctor's office. Medical staff were unable to obtain a blood pressure reading because it was too low, leading to loss of consciousness. He is unsure of the duration of unconsciousness.     Review of records note patient had just recently been hospitalized from 12/22-1/28/2025 and initially after experiencing an episode of chest pain, hypotension and near syncope during hemodialysis.  His initial blood pressure was low in 75/59. Troponin was significantly elevated and peaked at 9000, he was diagnosed with NSTEMI, started on heparin drip.  His Hospital course was also complicated altered mental status, influenza, GI bleeding, norovirus diarrhea. He had a gradual recovery and has been recommended to go to SNF by PT. Received HD in a chair 1/24 prior to being discharged to [facility].  Patient reports that he was there up until about 2 weeks ago when he was discharged home.  He was supposed to receive a 30-day supply of his medications, which he did not receive. He has been without some of his medications for about two weeks, although he continues to take his insulin, which is dosed at 30 units in the morning and 25 units at night. Daughter call the rehab  He attempted to refill his prescriptions at [facility] in [place] yesterday, but has not yet picked up the medications.  One of those medications was midodrine.    No chest pain, swelling, dark or black stools, or open wounds.  He did had dialysis yesterday.  He gone to his follow-up appointment with cardiology today when they were unable to obtain blood pressure and patient had a syncopal episode.   Patient in the emergency department patient was noted to be afebrile with blood pressures as low as 78/48.  Labs noted hemoglobin 12.9, potassium 4, BUN 25, creatinine 7.76, BNP 1106.3, and high-sensitivity troponin 81->88.  [facility] called to admit due to patient's multiple comorbidities.    Consultations: Phone conversation with nephrology   HOSPITAL COURSE:   Syncope secondary to orthostatic hypotension Patient is supposed to been on midodrine 10 mg 3 times daily, but after getting discharged from rehab was not given medications as he initially thought he would be.  Patient was able to get those medications refill and they are currently waiting at pharmacy. Patient was started back on midodrine with improvement in blood pressure.   Elevated troponin Chronic.  Patient denies any reports of chest pain.  High-sensitivity troponins 81->88.   ESRD on HD Patient normally dialyzes Monday, Wednesday, Friday.  Last hemodialysis session on 2/24.   Discussed with nephrology.  No clear indication for inpatient dialysis.  They have obtained a slot for him tomorrow for dialysis which will be of schedule and then he can resume his usual schedule from Friday.     Diabetes mellitus type 2 with long-term use of insulin Continue home  medication regimen   Right BKA   GERD  History of GIB -Continue Protonix  ObesityEstimated body mass index is 30.74 kg/m as calculated from the following:   Height as of an earlier encounter on 01/12/24: 6\' 1"  (1.854 m).   Weight as of an earlier encounter on  01/12/24: 105.7 kg.  Patient feels better after improvement in blood pressure.  Okay with going home today.  Okay for discharge.    PERTINENT LABS:  The results of significant diagnostics from this hospitalization (including imaging, microbiology, ancillary and laboratory) are listed below for reference.     Labs:   Basic Metabolic Panel: Recent Labs  Lab 01/12/24 1250 01/13/24 0455  NA 134* 131*  K 4.0 4.6  CL 94* 91*  CO2 23 27  GLUCOSE 139* 307*  BUN 25* 34*  CREATININE 7.76* 8.83*  CALCIUM 9.0 9.5  PHOS  --  4.9*   Liver Function Tests: Recent Labs  Lab 01/13/24 0455  ALBUMIN 3.1*    CBC: Recent Labs  Lab 01/12/24 1250 01/13/24 0455  WBC 6.2 5.5  NEUTROABS 3.3  --   HGB 12.9* 13.1  HCT 41.5 41.4  MCV 92.2 91.8  PLT 199 187     CBG: Recent Labs  Lab 01/12/24 1741 01/12/24 2226 01/13/24 0838 01/13/24 1149  GLUCAP 177* 242* 315* 137*     IMAGING STUDIES DG CHEST PORT 1 VIEW Result Date: 01/13/2024 CLINICAL DATA:  Syncope EXAM: PORTABLE CHEST 1 VIEW COMPARISON:  11/16/2023 FINDINGS: Cardiomegaly. There is no edema, consolidation, effusion, or pneumothorax. Artifact from EKG leads. IMPRESSION: Cardiomegaly without acute finding. Electronically Signed   By: Tiburcio Pea M.D.   On: 01/13/2024 06:42    DISCHARGE EXAMINATION: Vitals:   01/12/24 2115 01/13/24 0000 01/13/24 0402 01/13/24 1200  BP: 129/63 (!) 105/51 (!) 94/48 99/61  Pulse: 89 83 74 77  Resp: 14 16 15  (!) 21  Temp: 99 F (37.2 C)  98.5 F (36.9 C)   TempSrc: Oral  Oral   SpO2: 100% 100% 99% 99%   General appearance: Awake alert.  In no distress Resp: Clear to auscultation bilaterally.  Normal effort Cardio: S1-S2 is normal regular.  No S3-S4.  No rubs murmurs or bruit GI: Abdomen is soft.  Nontender nondistended.  Bowel sounds are present normal.  No masses organomegaly    DISPOSITION: Home  Discharge Instructions     Call MD for:  difficulty breathing, headache or  visual disturbances   Complete by: As directed    Call MD for:  extreme fatigue   Complete by: As directed    Call MD for:  persistant dizziness or light-headedness   Complete by: As directed    Call MD for:  persistant nausea and vomiting   Complete by: As directed    Call MD for:  severe uncontrolled pain   Complete by: As directed    Call MD for:  temperature >100.4   Complete by: As directed    Diet - low sodium heart healthy   Complete by: As directed    Discharge instructions   Complete by: As directed    Your dialysis center will be able to dialyze you tomorrow 02/27 at 10 AM.  Please arrive at the clinic at 9:45 AM.  Please take your medications as prescribed.  You were cared for by a hospitalist during your hospital stay. If you have any questions about your discharge medications or the care you received while you were in the hospital after  you are discharged, you can call the unit and asked to speak with the hospitalist on call if the hospitalist that took care of you is not available. Once you are discharged, your primary care physician will handle any further medical issues. Please note that NO REFILLS for any discharge medications will be authorized once you are discharged, as it is imperative that you return to your primary care physician (or establish a relationship with a primary care physician if you do not have one) for your aftercare needs so that they can reassess your need for medications and monitor your lab values. If you do not have a primary care physician, you can call [phone] for a physician referral.   Increase activity slowly   Complete by: As directed          Allergies as of 01/13/2024       Reactions   Sulfa Antibiotics Diarrhea, Nausea And Vomiting        Medication List     TAKE these medications    acetaminophen 500 MG tablet Commonly known as: TYLENOL Take 1,000 mg by mouth 2 (two) times daily as needed for moderate pain (pain score 4-6),  headache or fever.   aspirin EC 81 MG tablet Take 1 tablet (81 mg total) by mouth daily. Swallow whole.   atorvastatin 40 MG tablet Commonly known as: LIPITOR Take 1 tablet (40 mg total) by mouth daily.   BD Pen Needle Nano U/F 32G X 4 MM Misc Generic drug: Insulin Pen Needle Use with insulin pen.   cinacalcet 30 MG tablet Commonly known as: SENSIPAR Take 30 mg by mouth daily.   Dialyvite 800 0.8 MG Tabs Take 1 tablet by mouth daily.   lidocaine-prilocaine cream Commonly known as: EMLA Apply 1 Application topically every Monday, Wednesday, and Friday with hemodialysis.   midodrine 5 MG tablet Commonly known as: PROAMATINE Take 2 tablets (10 mg total) by mouth 3 (three) times daily with meals. What changed: medication strength   NovoLIN 70/30 Kwikpen (70-30) 100 UNIT/ML KwikPen Generic drug: insulin isophane & regular human KwikPen Inject 30U in the morning and 25U in the evening   pantoprazole 40 MG tablet Commonly known as: PROTONIX Take 1 tablet (40 mg total) by mouth 2 (two) times daily.   sevelamer carbonate 800 MG tablet Commonly known as: RENVELA Take 2,400-5,600 mg by mouth See admin instructions. Take 7 tablets (4000mg ) three times daily with meals and 3 tablets (2400mg ) twice daily with snacks.          Follow-up Information     [facility]. Go on 01/14/2024.   Why: Arrive at 9:45 am for 10:00 am chair time for make-up appointment. Contact information: [address] [place] [address] [phone]                 TOTAL DISCHARGE TIME:  Osvaldo Shipper  [facility] Pager on [email or web address]  01/14/2024, 11:11 AM

## 2024-01-13 NOTE — H&P (Incomplete)
 History and Physical    Patient: [name] AUMILLER Sr. WUJ:[id number] DOB: 1961/10/10 DOA: 01/12/2024 DOS: the patient was seen and examined on 01/12/2024 PCP: Pcp, No  Patient coming from: Via EMS  Chief Complaint:  Chief Complaint  Patient presents with  . Hypotension  . Syncopal Event   HPI: [name] MARTY Sr. is a 63 y.o. male with medical history significant of ESRD-HD (MWF), DM2, HTN, nonischemic cardiomyopathy, systolic CHF, prior right BKA who presents after passing out while at the doctor's office. Medical staff were unable to obtain a blood pressure reading because it was too low, leading to loss of consciousness. He is unsure of the duration of unconsciousness.    Review of records note patient had just recently been hospitalized from 12/22-1/28/2025 and initially after experiencing an episode of chest pain, hypotension and near syncope during hemodialysis.  His initial blood pressure was low in 75/59. Troponin was significantly elevated and peaked at 9000, he was diagnosed with NSTEMI, started on heparin drip.  His Hospital course was also complicated altered mental status, influenza, GI bleeding, norovirus diarrhea. He has had a gradual recovery and has been recommended to go to SNF by PT. Received HD in a chair 1/24.  He is being discharged to [facility].  Patient reports that he was there up until about 2 weeks ago when he was discharged home.  He was supposed to receive a 30-day supply of his medications, which he did not receive. He has been without some of his medications for about two weeks, although he continues to take his insulin, which is dosed at 30 units in the morning and 25 units at night. Daughter call the rehab He attempted to refill his prescriptions at [facility] in [place] yesterday, but has not yet picked up the medications.  One of those medications was midodrine.   No chest pain, swelling, dark or black stools, or open wounds.  He did had dialysis  yesterday.  He gone to his follow-up appointment with cardiology today.   Patient in the emergency department patient was noted to be afebrile with blood pressures as low as 78/48.  Labs noted WBC 12.9, potassium 4, BUN 25, and creatinine 7.76.  Review of Systems: {ROS_Text:26778} Past Medical History:  Diagnosis Date  . ESRD on hemodialysis (HCC)    a. HD MWF > 10 years.  . 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 01/2022 Echo: EF 45-50%, glob HK, mod LVH, mildly reduced RV fxn, mild MR, mod-sev MV Ca2+. Mild AS; b. 10/2023 Echo: EF 45-50%, glob HK, GrII DD, mildly reduced RV fxn, RVSP 52.34mmHg. Mild MR/MS, mod TR.  Marland Kitchen Hypertension   . Nonischemic cardiomyopathy (HCC)    a. 2019 Cath [facility]): Nonobstructive disease; b. 12/2019 Echo: EF 50%; c. 12/2019 MV (Tx w/u [facility]): Ant infarct w/ peri-infarct ischemia. EF 35%; c. 05/2021 MV (Tx w/u [facility]): Cor Ca2+, EF 53%, motion artifact, equiv study, likely low risk.  . Nonobstructive coronary artery disease    a.  2019 Cath ([place]): Left main normal, LAD 30 proximal, RCA 30 distal, EF 25%.  . Obesity   . Type 2 diabetes mellitus (HCC)    a. 10/2023 - admission w/ HHS.   Past Surgical History:  Procedure Laterality Date  . A/V FISTULAGRAM N/A 08/15/2019   Procedure: A/V FISTULAGRAM;  Surgeon: Maeola Harman, MD;  Location: [facility] INVASIVE CV LAB;  Service: Cardiovascular;  Laterality: N/A;  . AMPUTATION Right  02/02/2022   Procedure: AMPUTATION 5th RAY;  Surgeon: Louann Sjogren, MD;  Location: [facility] OR;  Service: Podiatry;  Laterality: Right;  . AMPUTATION Right 05/23/2022   Procedure: RIGHT BELOW KNEE AMPUTATION;  Surgeon: Nadara Mustard, MD;  Location: [facility] OR;  Service: Orthopedics;  Laterality: Right;  . AMPUTATION Right 07/04/2022   Procedure: REVISION AMPUTATION BELOW KNEE;  Surgeon: Nadara Mustard, MD;  Location: [facility] OR;  Service: Orthopedics;  Laterality: Right;  . APPLICATION OF WOUND VAC  05/23/2022    Procedure: APPLICATION OF WOUND VAC;  Surgeon: Nadara Mustard, MD;  Location: [facility] OR;  Service: Orthopedics;;  . AV FISTULA PLACEMENT    . CARDIAC CATHETERIZATION  02/17/2018   LHC 02/17/18 ([name]): 30% pLAD, 30% dRCA, LVEDP 25-30, LVEF 25%.    Marland Kitchen CATARACT EXTRACTION W/PHACO Right 03/29/2020   Procedure: CATARACT EXTRACTION PHACO AND INTRAOCULAR LENS PLACEMENT (IOC)  RIGHT DIABETIC;  Surgeon: Lockie Mola, MD;  Location: [facility] ORS;  Service: Ophthalmology;  Laterality: Right;  Lot # [id number] H [place]: 02:30.9 AP% 9.5% CDE: 14.38  . EYE SURGERY Right    cataract removed  . [name] N/A 12/02/2023   Procedure: [name];  Surgeon: Imogene Burn, MD;  Location: [facility] ENDOSCOPY;  Service: Gastroenterology;  Laterality: N/A;  . I & D EXTREMITY Right 02/02/2022   Procedure: IRRIGATION AND DEBRIDEMENT RIGHT FOOT;  Surgeon: Louann Sjogren, MD;  Location: [facility] OR;  Service: Podiatry;  Laterality: Right;  . INCISION AND DRAINAGE OF WOUND Right 02/08/2022   Procedure: IRRIGATION AND DEBRIDEMENT WOUND;  Surgeon: Asencion Islam, DPM;  Location: [facility] OR;  Service: Podiatry;  Laterality: Right;  with removal of infected bone   . INSERTION OF DIALYSIS CATHETER  08/13/2019   Procedure: Insertion Of Dialysis Catheter;  Surgeon: Larina Earthly, MD;  Location: [facility] OR;  Service: Vascular;;  . INSERTION OF DIALYSIS CATHETER Left 07/19/2020   Procedure: INSERTION OF DIALYSIS CATHETER;  Surgeon: Maeola Harman, MD;  Location: [facility] OR;  Service: Vascular;  Laterality: Left;  . IR LUMBAR PUNCTURE  11/20/2023  . IR US GUIDE BX ASP/DRAIN  11/07/2022  . IRRIGATION AND DEBRIDEMENT ABSCESS    . PERIPHERAL VASCULAR BALLOON ANGIOPLASTY  08/15/2019   Procedure: PERIPHERAL VASCULAR BALLOON ANGIOPLASTY;  Surgeon: Maeola Harman, MD;  Location: [facility] INVASIVE CV LAB;  Service: Cardiovascular;;  . PERIPHERAL VASCULAR BALLOON ANGIOPLASTY Left 02/06/2022   Procedure: PERIPHERAL VASCULAR BALLOON  ANGIOPLASTY;  Surgeon: Cephus Shelling, MD;  Location: [facility] INVASIVE CV LAB;  Service: Cardiovascular;  Laterality: Left;  . RADIOLOGY WITH ANESTHESIA N/A 11/20/2023   Procedure: MRI WITH ANESTHESIA;  Surgeon: Radiologist, Medication, MD;  Location: [facility] OR;  Service: Radiology;  Laterality: N/A;  . RADIOLOGY WITH ANESTHESIA N/A 11/20/2023   Procedure: IR WITH ANESTHESIA;  Surgeon: Gilmer Mor, DO;  Location: [facility] OR;  Service: Anesthesiology;  Laterality: N/A;  PLEASE OBTAIN OPENING PRESSURE AND DRAIN AT LEAST 30CC'S OF CSF.  Marland Kitchen REVISON OF ARTERIOVENOUS FISTULA Left 07/19/2020   Procedure: REVISON/PLICATION OF ARTERIOVENOUS FISTULA LEFT;  Surgeon: Maeola Harman, MD;  Location: [facility] OR;  Service: Vascular;  Laterality: Left;  . THROMBECTOMY AND REVISION OF ARTERIOVENTOUS (AV) GORETEX  GRAFT Left 08/13/2019   Procedure: THROMBECTOMY OF LEFT ARM ARTERIOVENTOUS (AV) FISTULA;  Surgeon: Larina Earthly, MD;  Location: [facility] OR;  Service: Vascular;  Laterality: Left;  . TOE AMPUTATION     Social History:  reports that he has never smoked. He has never used smokeless tobacco. He reports  that he does not currently use alcohol. He reports that he does not use drugs.  Allergies  Allergen Reactions  . Sulfa Antibiotics Diarrhea and Nausea And Vomiting    Family History  Problem Relation Age of Onset  . Hypertension Mother     Prior to Admission medications   Medication Sig Start Date End Date Taking? Authorizing Provider  acetaminophen (TYLENOL) 500 MG tablet Take 1,000 mg by mouth 2 (two) times daily as needed for moderate pain (pain score 4-6), headache or fever.   Yes [provider]  B Complex-C-Folic Acid (DIALYVITE 800) 0.8 MG TABS Take 1 tablet by mouth daily.   Yes [provider]  cinacalcet (SENSIPAR) 30 MG tablet Take 30 mg by mouth daily.   Yes [provider]  insulin isophane & regular human KwikPen (NOVOLIN 70/30 KWIKPEN) (70-30) 100 UNIT/ML KwikPen Inject 30U  in the morning and 25U in the evening 10/27/23  Yes Briant Cedar, MD  Insulin Pen Needle 32G X 4 MM MISC Use with insulin pen. 10/28/23  Yes Briant Cedar, MD  lidocaine-prilocaine (EMLA) cream Apply 1 Application topically every Monday, Wednesday, and Friday with hemodialysis.   Yes [provider]  sevelamer carbonate (RENVELA) 800 MG tablet Take 2,400-5,600 mg by mouth See admin instructions. Take 7 tablets (4000mg ) three times daily with meals and 3 tablets (2400mg ) twice daily with snacks.   Yes [provider]  aspirin EC 81 MG tablet Take 1 tablet (81 mg total) by mouth daily. Swallow whole. Patient not taking: Reported on 01/12/2024 12/15/23   Hughie Closs, MD  atorvastatin (LIPITOR) 40 MG tablet Take 1 tablet (40 mg total) by mouth daily. Patient not taking: Reported on 01/12/2024 12/15/23 01/14/24  Hughie Closs, MD  midodrine (PROAMATINE) 10 MG tablet Take 1 tablet (10 mg total) by mouth 3 (three) times daily with meals. Patient not taking: Reported on 01/12/2024 12/15/23 01/14/24  Hughie Closs, MD  pantoprazole (PROTONIX) 40 MG tablet Take 1 tablet (40 mg total) by mouth 2 (two) times daily. Patient not taking: Reported on 01/12/2024 12/15/23 01/26/24  Hughie Closs, MD    Physical Exam: Vitals:   01/12/24 1220 01/12/24 1449  BP: 102/62 131/63  Pulse: 83 87  Resp: 18 17  Temp: 98.2 F (36.8 C)   TempSrc: Oral   SpO2: 95% 100%    Constitutional: NAD, calm, comfortable Eyes: PERRL, lids and conjunctivae normal ENMT: Mucous membranes are moist. Posterior pharynx clear of any exudate or lesions.Normal dentition.  Neck: normal, supple, no masses, no thyromegaly Respiratory: clear to auscultation bilaterally, no wheezing, no crackles. Normal respiratory effort. No accessory muscle use.  Cardiovascular: Regular rate and rhythm, no murmurs / rubs / gallops. No extremity edema. 2+ pedal pulses. No carotid bruits.  Abdomen: no tenderness, no masses palpated.  No hepatosplenomegaly. Bowel sounds positive.  Musculoskeletal: no clubbing / cyanosis. No joint deformity upper and lower extremities. Good ROM, no contractures. Normal muscle tone.  Skin: no rashes, lesions, ulcers. No induration Neurologic: CN 2-12 grossly intact. Sensation intact, DTR normal. Strength 5/5 in all 4.  Psychiatric: Normal judgment and insight. Alert and oriented x 3. Normal mood.   Data Reviewed: {Tip this will not be part of the note when signed- Document your independent interpretation of telemetry tracing, EKG, lab, Radiology test or any other diagnostic tests. Add any new diagnostic test ordered today. (Optional):26781} Sinus rhythm with first-degree AV block with premature atrial complexes at 83 bpm.  Reviewed labs, imaging, and pertinent  records as documented.  Assessment and Plan:  Syncope secondary to orthostatic hypotension Patient is supposed to been on midodrine 10 mg 3 times daily, but after getting discharged from rehab was not given medications as he initially thought he would be.  Patient was able to get those medications refill and they are currently waiting at pharmacy. -Admit to a telemetry bed -Resume midodrine -Check orthostatic vital sign   Elevated troponin Chronic.  Patient denies any reports of chest pain.  High-sensitivity troponins 81->88.  ESRD on HD Patient normally dialyzes Monday, Wednesday, Friday.  Last hemodialysis session on 2/24.   Diabetes mellitus type 2 with long-term use of insulin Last seen today noted to be greater than 15.5 when checked on 10/24/2023.  Patient home insulin regimen includes NovoLog 70/30 mix 30 units every morning and 20 units nightly -Hypoglycemic protocols -Continue home insulin regimen of 7030 -CBGs before every meal with additional very sensitive SSI -Adjust insulin regimen as needed   Right BKA    GERD  History of GIB -Continue Protonix  DVT prophylaxis: Heparin  Advance Care Planning:   Code  Status: Prior ***  Consults: ***  Family Communication: ***  Severity of Illness: {Observation/Inpatient:21159}  Author: Clydie Braun, MD 01/12/2024 4:00 PM  For on call review [email or web address].

## 2024-01-13 NOTE — Progress Notes (Addendum)
 Contacted by nephrologist with request for pt to receive out-pt HD treatment tomorrow on off day. Contacted [facility]. Clinic checking to see if they have an appt available and to return call to navigator. Update provided to attending and nephrologist.   Olivia Canter Renal Navigator [phone]  Addendum at 11:40 am: Clinic can treat pt tomorrow. Pt will need to arrive at 9:45 am for 10:00 am chair time. Attempted to reach pt via phone but pt did not answer. Update provided to attending, nephrologist, and pt's RN. Appt added to AVS as well. Clinic reports that pt uses transportation but pt makes own arrangements for transportation for off schedule HD appts.   Addendum at 12:22 pm: Pt returned call to navigator. Pt advised of appt for tomorrow and pt agreeable. Pt states that transportation will not be an issue. Update provided to team.

## 2024-01-13 NOTE — Care Management Obs Status (Signed)
 MEDICARE OBSERVATION STATUS NOTIFICATION   Patient Details  Name: [name] LILE Sr. MRN: [id number] Date of Birth: 1961-10-10   Medicare Observation Status Notification Given:  Yes    Oletta Cohn, RN 01/13/2024, 2:24 PM

## 2024-01-15 ENCOUNTER — Other Ambulatory Visit: Payer: Self-pay

## 2024-01-15 ENCOUNTER — Emergency Department (HOSPITAL_COMMUNITY)
Admission: EM | Admit: 2024-01-15 | Discharge: 2024-01-15 | Disposition: A | Discharge status: Home / Self Care | Payer: Medicare Other | Attending: Emergency Medicine | Admitting: Emergency Medicine

## 2024-01-15 ENCOUNTER — Encounter (HOSPITAL_COMMUNITY): Payer: Self-pay | Admitting: *Deleted

## 2024-01-15 DIAGNOSIS — I959 Hypotension, unspecified: Secondary | ICD-10-CM | POA: Insufficient documentation

## 2024-01-15 DIAGNOSIS — Z7982 Long term (current) use of aspirin: Secondary | ICD-10-CM | POA: Insufficient documentation

## 2024-01-15 DIAGNOSIS — R112 Nausea with vomiting, unspecified: Secondary | ICD-10-CM

## 2024-01-15 DIAGNOSIS — Z794 Long term (current) use of insulin: Secondary | ICD-10-CM | POA: Diagnosis not present

## 2024-01-15 LAB — BASIC METABOLIC PANEL
Anion gap: 16 — ABNORMAL HIGH (ref 5–15)
BUN: 15 mg/dL (ref 8–23)
CO2: 27 mmol/L (ref 22–32)
Calcium: 9 mg/dL (ref 8.9–10.3)
Chloride: 90 mmol/L — ABNORMAL LOW (ref 98–111)
Creatinine, Ser: 5.71 mg/dL — ABNORMAL HIGH (ref 0.61–1.24)
GFR, Estimated: 11 mL/min — ABNORMAL LOW (ref >60)
Glucose, Bld: 178 mg/dL — ABNORMAL HIGH (ref 70–99)
Potassium: 3.6 mmol/L (ref 3.5–5.1)
Sodium: 133 mmol/L — ABNORMAL LOW (ref 135–145)

## 2024-01-15 LAB — CBC WITH DIFFERENTIAL/PLATELET
Abs Immature Granulocytes: 0.01 10*3/uL (ref 0.00–0.07)
Basophils Absolute: 0.1 10*3/uL (ref 0.0–0.1)
Basophils Relative: 1 %
Eosinophils Absolute: 0.5 10*3/uL (ref 0.0–0.5)
Eosinophils Relative: 6 %
HCT: 46.6 % (ref 39.0–52.0)
Hemoglobin: 14.4 g/dL (ref 13.0–17.0)
Immature Granulocytes: 0 %
Lymphocytes Relative: 18 %
Lymphs Abs: 1.3 10*3/uL (ref 0.7–4.0)
MCH: 28.7 pg (ref 26.0–34.0)
MCHC: 30.9 g/dL (ref 30.0–36.0)
MCV: 92.8 fL (ref 80.0–100.0)
Monocytes Absolute: 1.4 10*3/uL — ABNORMAL HIGH (ref 0.1–1.0)
Monocytes Relative: 19 %
Neutro Abs: 3.9 10*3/uL (ref 1.7–7.7)
Neutrophils Relative %: 56 %
Platelets: 184 10*3/uL (ref 150–400)
RBC: 5.02 MIL/uL (ref 4.22–5.81)
RDW: 18.4 % — ABNORMAL HIGH (ref 11.5–15.5)
WBC: 7.2 10*3/uL (ref 4.0–10.5)
nRBC: 0 % (ref 0.0–0.2)

## 2024-01-15 MED ORDER — SODIUM CHLORIDE 0.9 % IV BOLUS
500.0000 mL | Freq: Once | INTRAVENOUS | Status: AC
  Administered 2024-01-15: 500 mL via INTRAVENOUS

## 2024-01-15 NOTE — ED Triage Notes (Signed)
 Pt had dialysis today and was being transported home via [facility]. The [facility] driver reports the pt started vomiting and shaking all over. He brought the pt to the ED for further evaluation. Pt arrives to ED falling asleep during triage questions. Pt reports dizziness and weakness. Denies pain.

## 2024-01-15 NOTE — Discharge Instructions (Signed)
 You were seen in the emergency department for nausea vomiting and low blood pressure after dialysis.  After some fluids you were able to eat and drink and your blood pressure stabilized.  Please follow-up with your primary care doctor.  Rest and drink plenty of fluids.  Return to the emergency department if any worsening or concerning symptoms

## 2024-01-15 NOTE — ED Notes (Signed)
 See triage notes. no emesis since arrival to ED. A/o. Left arm with fistula intact. Color wnl. Nad.

## 2024-01-15 NOTE — ED Provider Notes (Signed)
 [place] EMERGENCY DEPARTMENT AT [facility] Provider Note   CSN: [id number] Arrival date & time: 01/15/24  1700     History  Chief Complaint  Patient presents with   Emesis    [name] MCCAREY Sr. is a 63 y.o. male.  Was brought in by his transport Zenaida Niece after he vomited 3 times on the way home from dialysis today.  Blood pressure low in the 80s.  Patient states he feels dizzy.  He denies any abdominal pain chest pain shortness of breath.  He was just discharged from the hospital 2 days ago after being admitted for hypotension and elevated troponin.  It was felt that he was orthostatic due to not being on his midodrine.  This medication was restarted with some improvement in his blood pressure.  The history is provided by the patient.  Emesis Severity:  Moderate Progression:  Resolved Chronicity:  New Associated symptoms: no abdominal pain, no cough, no diarrhea, no fever and no headaches        Home Medications Prior to Admission medications   Medication Sig Start Date End Date Taking? Authorizing Provider  acetaminophen (TYLENOL) 500 MG tablet Take 1,000 mg by mouth 2 (two) times daily as needed for moderate pain (pain score 4-6), headache or fever.    [provider]  aspirin EC 81 MG tablet Take 1 tablet (81 mg total) by mouth daily. Swallow whole. 01/13/24   Osvaldo Shipper, MD  atorvastatin (LIPITOR) 40 MG tablet Take 1 tablet (40 mg total) by mouth daily. 01/13/24 02/12/24  Osvaldo Shipper, MD  B Complex-C-Folic Acid (DIALYVITE 800) 0.8 MG TABS Take 1 tablet by mouth daily.    [provider]  cinacalcet (SENSIPAR) 30 MG tablet Take 30 mg by mouth daily.    [provider]  insulin isophane & regular human KwikPen (NOVOLIN 70/30 KWIKPEN) (70-30) 100 UNIT/ML KwikPen Inject 30U in the morning and 25U in the evening 10/27/23   Briant Cedar, MD  Insulin Pen Needle 32G X 4 MM MISC Use with insulin pen. 10/28/23   Briant Cedar, MD   lidocaine-prilocaine (EMLA) cream Apply 1 Application topically every Monday, Wednesday, and Friday with hemodialysis.    [provider]  midodrine (PROAMATINE) 5 MG tablet Take 2 tablets (10 mg total) by mouth 3 (three) times daily with meals. 01/13/24 04/12/24  Osvaldo Shipper, MD  pantoprazole (PROTONIX) 40 MG tablet Take 1 tablet (40 mg total) by mouth 2 (two) times daily. 01/13/24 04/12/24  Osvaldo Shipper, MD  sevelamer carbonate (RENVELA) 800 MG tablet Take 2,400-5,600 mg by mouth See admin instructions. Take 7 tablets (4000mg ) three times daily with meals and 3 tablets (2400mg ) twice daily with snacks.    [provider]      Allergies    Sulfa antibiotics    Review of Systems   Review of Systems  Constitutional:  Negative for fever.  Respiratory:  Negative for cough.   Gastrointestinal:  Positive for vomiting. Negative for abdominal pain and diarrhea.  Neurological:  Negative for headaches.    Physical Exam Updated Vital Signs BP 120/76 (BP Location: Right Arm)   Pulse 92   Temp 98.4 F (36.9 C) (Oral)   Resp 19   Ht 6\' 1"  (1.854 m)   Wt 105.7 kg   SpO2 95%   BMI 30.74 kg/m  Physical Exam Vitals and nursing note reviewed.  Constitutional:      General: He is not in acute distress.  Appearance: Normal appearance. He is well-developed.  HENT:     Head: Normocephalic and atraumatic.  Eyes:     Conjunctiva/sclera: Conjunctivae normal.  Cardiovascular:     Rate and Rhythm: Normal rate and regular rhythm.     Heart sounds: Murmur heard.  Pulmonary:     Effort: Pulmonary effort is normal. No respiratory distress.     Breath sounds: Normal breath sounds.  Abdominal:     Palpations: Abdomen is soft.     Tenderness: There is no abdominal tenderness. There is no guarding or rebound.  Musculoskeletal:     Cervical back: Neck supple.     Comments: He has a fistula left upper arm.  Right BKA.  Skin:    General: Skin is warm and dry.     Capillary  Refill: Capillary refill takes less than 2 seconds.  Neurological:     General: No focal deficit present.     Mental Status: He is alert.     ED Results / Procedures / Treatments   Labs (all labs ordered are listed, but only abnormal results are displayed) Labs Reviewed  BASIC METABOLIC PANEL - Abnormal; Notable for the following components:      Result Value   Sodium 133 (*)    Chloride 90 (*)    Glucose, Bld 178 (*)    Creatinine, Ser 5.71 (*)    GFR, Estimated 11 (*)    Anion gap 16 (*)    All other components within normal limits  CBC WITH DIFFERENTIAL/PLATELET - Abnormal; Notable for the following components:   RDW 18.4 (*)    Monocytes Absolute 1.4 (*)    All other components within normal limits    EKG EKG Interpretation Date/Time:  Friday January 15 2024 17:26:44 EST Ventricular Rate:  84 PR Interval:  240 QRS Duration:  154 QT Interval:  463 QTC Calculation: 548 R Axis:   -85  Text Interpretation: Sinus rhythm Atrial premature complex Prolonged PR interval Nonspecific IVCD with LAD LVH with secondary repolarization abnormality Anterior Q waves, possibly due to LVH COPY Confirmed by Meridee Score [phone]) on 01/15/2024 5:49:03 PM Also confirmed by Meridee Score [phone]), editor Stetler, Angela [phone])  on 01/16/2024 8:16:31 AM  Radiology No results found.  Procedures Procedures    Medications Ordered in ED Medications  sodium chloride 0.9 % bolus 500 mL (0 mLs Intravenous Stopped 01/15/24 1901)    ED Course/ Medical Decision Making/ A&P Clinical Course as of 01/15/24 1721  Fri Jan 15, 2024  1721 Blood pressure in the 80s.  He is awake and alert.  He is refusing any blood work or an IV to get some IV fluids. [MB]    Clinical Course User Index [MB] Terrilee Files, MD                                 Medical Decision Making Amount and/or Complexity of Data Reviewed Labs: ordered.   This patient complains of low blood pressure vomiting; this  involves an extensive number of treatment Options and is a complaint that carries with it a high risk of complications and morbidity. The differential includes hypovolemia, postdialysis syndrome, metabolic derangement, infection  I ordered, reviewed and interpreted labs, which included CBC unremarkable, chemistries consistent with CKD I ordered medication IV fluids and reviewed PMP when indicated. Previous records obtained and reviewed in epic including recent ED and discharge summary Cardiac monitoring reviewed, sinus rhythm  Social determinants considered, no significant barriers Critical Interventions: None  After the interventions stated above, I reevaluated the patient and found patient to be symptomatically improved eating and drinking.  Orthostatics negative Admission and further testing considered, no indications for admission at this time.  Recommended close follow-up with his treatment team.  Return instructions discussed         Final Clinical Impression(s) / ED Diagnoses Final diagnoses:  Nausea and vomiting, unspecified vomiting type  Hypotension, unspecified hypotension type    Rx / DC Orders ED Discharge Orders     None         Terrilee Files, MD 01/16/24 1047

## 2024-01-21 ENCOUNTER — Encounter (HOSPITAL_COMMUNITY)
Admission: RE | Disposition: A | Discharge status: Home / Self Care | Payer: Self-pay | Source: Home / Self Care | Attending: Nephrology

## 2024-01-21 ENCOUNTER — Other Ambulatory Visit: Payer: Self-pay

## 2024-01-21 ENCOUNTER — Encounter (HOSPITAL_COMMUNITY): Payer: Self-pay | Admitting: Nephrology

## 2024-01-21 ENCOUNTER — Ambulatory Visit (HOSPITAL_COMMUNITY)
Admission: RE | Admit: 2024-01-21 | Discharge: 2024-01-21 | Disposition: A | Discharge status: Home / Self Care | Attending: Nephrology | Admitting: Nephrology

## 2024-01-21 DIAGNOSIS — N250 Renal osteodystrophy: Secondary | ICD-10-CM | POA: Insufficient documentation

## 2024-01-21 DIAGNOSIS — Z89511 Acquired absence of right leg below knee: Secondary | ICD-10-CM | POA: Diagnosis not present

## 2024-01-21 DIAGNOSIS — I132 Hypertensive heart and chronic kidney disease with heart failure and with stage 5 chronic kidney disease, or end stage renal disease: Secondary | ICD-10-CM | POA: Insufficient documentation

## 2024-01-21 DIAGNOSIS — Z8249 Family history of ischemic heart disease and other diseases of the circulatory system: Secondary | ICD-10-CM | POA: Diagnosis not present

## 2024-01-21 DIAGNOSIS — T82510A Breakdown (mechanical) of surgically created arteriovenous fistula, initial encounter: Secondary | ICD-10-CM | POA: Insufficient documentation

## 2024-01-21 DIAGNOSIS — I871 Compression of vein: Secondary | ICD-10-CM | POA: Insufficient documentation

## 2024-01-21 DIAGNOSIS — T82858A Stenosis of vascular prosthetic devices, implants and grafts, initial encounter: Secondary | ICD-10-CM | POA: Diagnosis present

## 2024-01-21 DIAGNOSIS — I428 Other cardiomyopathies: Secondary | ICD-10-CM | POA: Diagnosis not present

## 2024-01-21 DIAGNOSIS — Z794 Long term (current) use of insulin: Secondary | ICD-10-CM | POA: Insufficient documentation

## 2024-01-21 DIAGNOSIS — I5020 Unspecified systolic (congestive) heart failure: Secondary | ICD-10-CM | POA: Insufficient documentation

## 2024-01-21 DIAGNOSIS — Z992 Dependence on renal dialysis: Secondary | ICD-10-CM | POA: Insufficient documentation

## 2024-01-21 DIAGNOSIS — D631 Anemia in chronic kidney disease: Secondary | ICD-10-CM | POA: Insufficient documentation

## 2024-01-21 DIAGNOSIS — N186 End stage renal disease: Secondary | ICD-10-CM | POA: Diagnosis not present

## 2024-01-21 DIAGNOSIS — E1122 Type 2 diabetes mellitus with diabetic chronic kidney disease: Secondary | ICD-10-CM | POA: Diagnosis not present

## 2024-01-21 HISTORY — PX: PERIPHERAL VASCULAR BALLOON ANGIOPLASTY: CATH118281

## 2024-01-21 HISTORY — PX: A/V FISTULAGRAM: CATH118298

## 2024-01-21 MED ORDER — HEPARIN (PORCINE) IN NACL 1000-0.9 UT/500ML-% IV SOLN
INTRAVENOUS | Status: DC | PRN
Start: 2024-01-21 — End: 2024-01-21
  Administered 2024-01-21: 500 mL

## 2024-01-21 MED ORDER — LIDOCAINE HCL (PF) 1 % IJ SOLN
INTRAMUSCULAR | Status: AC
  Filled 2024-01-21: qty 30

## 2024-01-21 MED ORDER — MIDAZOLAM HCL 2 MG/2ML IJ SOLN
INTRAMUSCULAR | Status: AC
Start: 2024-01-21 — End: ?
  Filled 2024-01-21: qty 2

## 2024-01-21 MED ORDER — FENTANYL CITRATE (PF) 100 MCG/2ML IJ SOLN
INTRAMUSCULAR | Status: AC
  Filled 2024-01-21: qty 2

## 2024-01-21 MED ORDER — FENTANYL CITRATE (PF) 100 MCG/2ML IJ SOLN
INTRAMUSCULAR | Status: DC | PRN
  Administered 2024-01-21: 25 ug via INTRAVENOUS

## 2024-01-21 MED ORDER — IODIXANOL 320 MG/ML IV SOLN
INTRAVENOUS | Status: DC | PRN
  Administered 2024-01-21: 10 mL via INTRAVENOUS

## 2024-01-21 MED ORDER — LIDOCAINE HCL (PF) 1 % IJ SOLN
INTRAMUSCULAR | Status: DC | PRN
  Administered 2024-01-21: 2 mL via SUBCUTANEOUS

## 2024-01-21 MED ORDER — MIDAZOLAM HCL 2 MG/2ML IJ SOLN
INTRAMUSCULAR | Status: DC | PRN
  Administered 2024-01-21: 1 mg via INTRAVENOUS

## 2024-01-21 NOTE — Op Note (Signed)
 Patient presents with decreased access flows of his left BCF (aneurysm revision on July 19, 2020 by Dr. Randie Heinz).  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    Summary:  1)      The patient had successful angioplasty (9x4 Athletis FE ~18 atm) of significant 80%  stenosis in the cephalic vein arch not involving the confluence.  2)      The body of the cephalic vein fistula is aneurysmal but otherwise patent with good flows. 3)      The centrals and inflow were widely patent. 4)      This left BCF remains amenable to future percutaneous intervention; will refer to [facility] to consider aneurysm revision again given the hypopigmentation over the proximal aneurysm.  Description of procedure: The arm was prepped and draped in the usual sterile fashion. The left upper arm brachial cephalic fistula was cannulated (16109) with an 18G Angiocath needle directed in an antegrade direction. A guidewire was inserted and exchanged for a 7Fr sheath. Contrast [phone]) injection via the side port of the sheath was performed. The angiogram of the fistula (09811) showed 2 large aneurysms cannulation zone, 80% cephalic vein arch stenosis not involving the confluence. The inflow anastomosis and left centrals were patent.  The wire was advanced centrally with some difficulty treating the aneurysms requiring a Bernstein catheter. A 9x4 Athletis angioplasty balloon was inserted over a glide wire and positioned at the cephalic vein arch stenosis. Venous angioplasty (91478) was carried out to 18 ATM with FULL effacement of the waist on the balloon at the arch lesion.  Repeat angiogram showed <30% residual at the site of angioplasty with no evidence of extravasation.  The flow of contrast was quicker and the fistula was markedly less pulsatile.  Hemostasis: A 3-0 ethilon purse string suture was placed at the cannulation site on  removal of the sheath.  Sedation: 1 mg Versed, 25 mcg Fentanyl. Sedation time. 15 minutes  Contrast. 10 mL  Monitoring: Because of the patient's comorbid conditions and sedation during the procedure, continuous EKG monitoring and O2 saturation monitoring was performed throughout the procedure by the RN. There were no abnormal arrhythmias encountered.  Complications: None.   Diagnoses: I87.1 Stricture of vein  N18.6 ESRD T82.858A Stricture of access  Procedure Coding:  [phone] Cannulation and angiogram of fistula, venous angioplasty (cephalic vein arch) Z3086 Contrast  Recommendations:  1. Continue to cannulate the fistula with 15G needles.  2. Refer back for problems with flows. 3. Remove the suture next treatment; will refer to [facility] to consider aneurysm revision.   Discharge: The patient was discharged home in stable condition. The patient was given education regarding the care of the dialysis access AVF and specific instructions in case of any problems.

## 2024-01-21 NOTE — H&P (Addendum)
 Chief Complaint: Decreased flows  Interval H&P  The patient has presented today for an angiogram/ angioplasty.  Various methods of treatment have been discussed with the patient.  After consideration of risk, benefits and other options for treatment, the patient has consented to a angiogram/ angioplasty with  possible stent placement.   Risks of angiogram with potential angioplasty and stenting if needed.contrast reaction, extravasation/ bleeding, dissection, hypotension and death were explained to the patient.  The patient's history has been reviewed and the patient has been examined, no changes in status.  Stable for angiogram/angioplasty  I have reviewed the patient's chart and labs.  Questions were answered to the patient's satisfaction.  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 Randie Heinz.  Planning on angiogram with possibly angioplasty. Renal osteodystrophy - continue binders per home regimen. Anemia - managed with ESA's and IV iron at dialysis center. HTN - resume home regimen.   HPI: [name] MECKEL Sr. is an 63 y.o. male HFrEF, CASHD w/ h/o NSTEMI, hypertension, diabetes, nonischemic cardiomyopathy, ESRD.  Patient is being referred for decreasing access flows.  ROS Per HPI.  Chemistry and CBC: Creatinine, Ser  Date/Time Value Ref Range Status  01/15/2024 05:29 PM 5.71 (H) 0.61 - 1.24 mg/dL Final  66/44/0347 42:59 AM 8.83 (H) 0.61 - 1.24 mg/dL Final  56/38/7564 33:29 PM 7.76 (H) 0.61 - 1.24 mg/dL Final  51/88/4166 06:30 AM 10.57 (H) 0.61 - 1.24 mg/dL Final  16/11/930 35:57 PM 9.03 (H) 0.61 - 1.24 mg/dL Final  32/20/2542 70:62 AM 8.80 (H) 0.61 - 1.24 mg/dL Final  37/62/8315 17:61 AM 7.02 (H) 0.61 - 1.24 mg/dL Final  60/73/7106 26:94 AM 9.60 (H) 0.61 - 1.24  mg/dL Final  85/46/2703 50:09 AM 8.06 (H) 0.61 - 1.24 mg/dL Final  38/18/2993 71:69 AM 11.71 (H) 0.61 - 1.24 mg/dL Final  67/89/3810 17:51 AM 10.01 (H) 0.61 - 1.24 mg/dL Final  02/58/5277 82:42 AM 8.06 (H) 0.61 - 1.24 mg/dL Final  35/36/1443 15:40 AM 9.49 (H) 0.61 - 1.24 mg/dL Final  08/67/6195 09:32 AM 7.81 (H) 0.61 - 1.24 mg/dL Final  67/10/4579 99:83 AM 11.57 (H) 0.61 - 1.24 mg/dL Final  38/25/0539 76:73 AM 11.83 (H) 0.61 - 1.24 mg/dL Final  41/93/7902 40:97 AM 11.32 (H) 0.61 - 1.24 mg/dL Final  35/32/9924 26:83 AM 13.17 (H) 0.61 - 1.24 mg/dL Final  41/96/2229 79:89 AM 11.72 (H) 0.61 - 1.24 mg/dL Final  21/19/4174 08:14 AM 13.22 (H) 0.61 - 1.24 mg/dL Final  48/18/5631 49:70 PM 14.18 (H) 0.61 - 1.24 mg/dL Final  26/37/8588 50:27 AM 11.15 (H) 0.61 - 1.24 mg/dL Final  74/10/8785 76:72 AM 9.70 (H) 0.61 - 1.24 mg/dL Final  09/47/0962 83:66 AM 7.77 (H) 0.61 - 1.24 mg/dL Final  29/47/6546 50:35 AM 9.31 (H) 0.61 - 1.24 mg/dL Final  46/56/8127 51:70 AM 7.21 (H) 0.61 - 1.24 mg/dL Final  01/74/9449 67:59 AM 8.39 (H) 0.61 - 1.24 mg/dL Final  16/38/4665 99:35 PM 11.53 (H) 0.61 - 1.24 mg/dL Final  70/17/7939 03:00 AM 10.09 (H) 0.61 - 1.24 mg/dL Final  92/33/0076 22:63 PM 8.80 (H) 0.61 - 1.24 mg/dL Final  33/54/5625 63:89 AM 11.47 (H) 0.61 - 1.24 mg/dL Final  37/34/2876 81:15 PM 10.49 (H) 0.61 - 1.24 mg/dL Final  72/62/0355 97:41 PM 7.50 (H) 0.61 - 1.24 mg/dL Final  10/27/2023 02:08 AM 8.37 (H) 0.61 - 1.24 mg/dL Final  16/08/9603 54:09 AM 10.98 (H) 0.61 - 1.24 mg/dL Final  81/19/1478 29:56 PM 10.15 (H) 0.61 - 1.24 mg/dL Final  21/30/8657 84:69 PM 7.99 (H) 0.61 - 1.24 mg/dL Final  62/95/2841 32:44 AM 7.95 (H) 0.61 - 1.24 mg/dL Final  11/19/7251 66:44 AM 7.87 (H) 0.61 - 1.24 mg/dL Final  03/47/4259 56:38 AM 8.10 (H) 0.61 - 1.24 mg/dL Final  75/64/3329 51:88 PM 6.75 (H) 0.61 - 1.24 mg/dL Final  41/66/0630 16:01 AM 10.30 (H) 0.61 - 1.24 mg/dL Final  09/32/3557 32:20 AM 9.74 (H) 0.61 - 1.24 mg/dL  Final  25/42/7062 37:62 AM 7.68 (H) 0.61 - 1.24 mg/dL Final  83/15/1761 60:73 AM 9.88 (H) 0.61 - 1.24 mg/dL Final  71/04/2693 85:46 AM 7.69 (H) 0.61 - 1.24 mg/dL Final  27/01/5008 38:18 AM 10.82 (H) 0.61 - 1.24 mg/dL Final  29/93/7169 67:89 AM 9.24 (H) 0.61 - 1.24 mg/dL Final  38/08/1750 02:58 AM 7.34 (H) 0.61 - 1.24 mg/dL Final  52/77/8242 35:36 AM 9.09 (H) 0.61 - 1.24 mg/dL Final  14/43/1540 08:67 AM 7.00 (H) 0.61 - 1.24 mg/dL Final  61/95/0932 67:12 AM 9.28 (H) 0.61 - 1.24 mg/dL Final   Recent Labs  Lab 01/15/24 1729  NA 133*  K 3.6  CL 90*  CO2 27  GLUCOSE 178*  BUN 15  CREATININE 5.71*  CALCIUM 9.0   Recent Labs  Lab 01/15/24 1729  WBC 7.2  NEUTROABS 3.9  HGB 14.4  HCT 46.6  MCV 92.8  PLT 184   Liver Function Tests: No results for input(s): "AST", "ALT", "ALKPHOS", "BILITOT", "PROT", "ALBUMIN" in the last 168 hours. No results for input(s): "LIPASE", "AMYLASE" in the last 168 hours. No results for input(s): "AMMONIA" in the last 168 hours. Cardiac Enzymes: No results for input(s): "CKTOTAL", "CKMB", "CKMBINDEX", "TROPONINI" in the last 168 hours. Iron Studies: No results for input(s): "IRON", "TIBC", "TRANSFERRIN", "FERRITIN" in the last 72 hours. PT/INR: @LABRCNTIP (inr:5)  Xrays/Other Studies: )No results found for this or any previous visit (from the past 48 hours). No results found.  PMH:   Past Medical History:  Diagnosis Date   ESRD on hemodialysis (HCC)    a. HD MWF > 10 years.   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 01/2022 Echo: EF 45-50%, glob HK, mod LVH, mildly reduced RV fxn, mild MR, mod-sev MV Ca2+. Mild AS; b. 10/2023 Echo: EF 45-50%, glob HK, GrII DD, mildly reduced RV fxn, RVSP 52.63mmHg. Mild MR/MS, mod TR.   Hypertension    Nonischemic cardiomyopathy (HCC)    a. 2019 Cath [facility]): Nonobstructive disease; b. 12/2019 Echo: EF 50%; c. 12/2019 MV (Tx w/u [facility]): Ant infarct w/ peri-infarct ischemia. EF 35%; c. 05/2021  MV (Tx w/u [facility]): Cor Ca2+, EF 53%, motion artifact, equiv study, likely low risk.   Nonobstructive coronary artery disease    a.  2019 Cath ([place]): Left main normal, LAD 30 proximal, RCA 30 distal, EF 25%.   Obesity    Type 2 diabetes mellitus (HCC)    a. 10/2023 - admission w/ HHS.    PSH:   Past Surgical History:  Procedure Laterality Date   A/V FISTULAGRAM N/A 08/15/2019   Procedure: A/V FISTULAGRAM;  Surgeon: Maeola Harman, MD;  Location: [facility] INVASIVE CV LAB;  Service: Cardiovascular;  Laterality: N/A;   AMPUTATION Right 02/02/2022   Procedure: AMPUTATION 5th RAY;  Surgeon: Louann Sjogren, MD;  Location: [facility] OR;  Service: Podiatry;  Laterality: Right;   AMPUTATION Right 05/23/2022   Procedure: RIGHT BELOW KNEE AMPUTATION;  Surgeon: Nadara Mustard, MD;  Location: [facility] OR;  Service: Orthopedics;  Laterality: Right;   AMPUTATION Right 07/04/2022   Procedure: REVISION AMPUTATION BELOW KNEE;  Surgeon: Nadara Mustard, MD;  Location: [facility] OR;  Service: Orthopedics;  Laterality: Right;   APPLICATION OF WOUND VAC  05/23/2022   Procedure: APPLICATION OF WOUND VAC;  Surgeon: Nadara Mustard, MD;  Location: [facility] OR;  Service: Orthopedics;;   AV FISTULA PLACEMENT     CARDIAC CATHETERIZATION  02/17/2018   LHC 02/17/18 ([name]): 30% pLAD, 30% dRCA, LVEDP 25-30, LVEF 25%.     CATARACT EXTRACTION W/PHACO Right 03/29/2020   Procedure: CATARACT EXTRACTION PHACO AND INTRAOCULAR LENS PLACEMENT (IOC)  RIGHT DIABETIC;  Surgeon: Lockie Mola, MD;  Location: [facility] ORS;  Service: Ophthalmology;  Laterality: Right;  Lot # [id number] H [place]: 02:30.9 AP% 9.5% CDE: 14.38   EYE SURGERY Right    cataract removed   [name] N/A 12/02/2023   Procedure: [name];  Surgeon: Imogene Burn, MD;  Location: [facility] ENDOSCOPY;  Service: Gastroenterology;  Laterality: N/A;   I & D EXTREMITY Right 02/02/2022   Procedure: IRRIGATION AND DEBRIDEMENT RIGHT FOOT;  Surgeon: Louann Sjogren, MD;  Location: [facility] OR;  Service: Podiatry;  Laterality: Right;   INCISION AND DRAINAGE OF WOUND Right 02/08/2022   Procedure: IRRIGATION AND DEBRIDEMENT WOUND;  Surgeon: Asencion Islam, DPM;  Location: [facility] OR;  Service: Podiatry;  Laterality: Right;  with removal of infected bone    INSERTION OF DIALYSIS CATHETER  08/13/2019   Procedure: Insertion Of Dialysis Catheter;  Surgeon: Larina Earthly, MD;  Location: [facility] OR;  Service: Vascular;;   INSERTION OF DIALYSIS CATHETER Left 07/19/2020   Procedure: INSERTION OF DIALYSIS CATHETER;  Surgeon: Maeola Harman, MD;  Location: [facility] OR;  Service: Vascular;  Laterality: Left;   IR LUMBAR PUNCTURE  11/20/2023   IR US GUIDE BX ASP/DRAIN  11/07/2022   IRRIGATION AND DEBRIDEMENT ABSCESS     PERIPHERAL VASCULAR BALLOON ANGIOPLASTY  08/15/2019   Procedure: PERIPHERAL VASCULAR BALLOON ANGIOPLASTY;  Surgeon: Maeola Harman, MD;  Location: [facility] INVASIVE CV LAB;  Service: Cardiovascular;;   PERIPHERAL VASCULAR BALLOON ANGIOPLASTY Left 02/06/2022   Procedure: PERIPHERAL VASCULAR BALLOON ANGIOPLASTY;  Surgeon: Cephus Shelling, MD;  Location: [facility] INVASIVE CV LAB;  Service: Cardiovascular;  Laterality: Left;   RADIOLOGY WITH ANESTHESIA N/A 11/20/2023   Procedure: MRI WITH ANESTHESIA;  Surgeon: Radiologist, Medication, MD;  Location: [facility] OR;  Service: Radiology;  Laterality: N/A;   RADIOLOGY WITH ANESTHESIA N/A 11/20/2023   Procedure: IR WITH ANESTHESIA;  Surgeon: Gilmer Mor, DO;  Location: [facility] OR;  Service: Anesthesiology;  Laterality: N/A;  PLEASE OBTAIN OPENING PRESSURE AND DRAIN AT LEAST 30CC'S OF CSF.   REVISON OF ARTERIOVENOUS FISTULA Left 07/19/2020   Procedure: REVISON/PLICATION OF ARTERIOVENOUS FISTULA LEFT;  Surgeon: Maeola Harman, MD;  Location: [facility] OR;  Service: Vascular;  Laterality: Left;   THROMBECTOMY AND REVISION OF ARTERIOVENTOUS (AV) GORETEX  GRAFT Left 08/13/2019   Procedure: THROMBECTOMY OF LEFT ARM ARTERIOVENTOUS (AV)  FISTULA;  Surgeon: Larina Earthly, MD;  Location: [facility] OR;  Service: Vascular;  Laterality: Left;   TOE AMPUTATION      Allergies:  Allergies  Allergen Reactions   Sulfa Antibiotics Diarrhea and Nausea And Vomiting    Medications:   Prior to Admission medications   Medication Sig Start Date End Date  Taking? Authorizing Provider  acetaminophen (TYLENOL) 500 MG tablet Take 1,000 mg by mouth 2 (two) times daily as needed for moderate pain (pain score 4-6), headache or fever.    [provider]  aspirin EC 81 MG tablet Take 1 tablet (81 mg total) by mouth daily. Swallow whole. 01/13/24   Osvaldo Shipper, MD  atorvastatin (LIPITOR) 40 MG tablet Take 1 tablet (40 mg total) by mouth daily. 01/13/24 02/12/24  Osvaldo Shipper, MD  B Complex-C-Folic Acid (DIALYVITE 800) 0.8 MG TABS Take 1 tablet by mouth daily.    [provider]  cinacalcet (SENSIPAR) 30 MG tablet Take 30 mg by mouth daily.    [provider]  insulin isophane & regular human KwikPen (NOVOLIN 70/30 KWIKPEN) (70-30) 100 UNIT/ML KwikPen Inject 30U in the morning and 25U in the evening 10/27/23   Briant Cedar, MD  Insulin Pen Needle 32G X 4 MM MISC Use with insulin pen. 10/28/23   Briant Cedar, MD  lidocaine-prilocaine (EMLA) cream Apply 1 Application topically every Monday, Wednesday, and Friday with hemodialysis.    [provider]  midodrine (PROAMATINE) 5 MG tablet Take 2 tablets (10 mg total) by mouth 3 (three) times daily with meals. 01/13/24 04/12/24  Osvaldo Shipper, MD  pantoprazole (PROTONIX) 40 MG tablet Take 1 tablet (40 mg total) by mouth 2 (two) times daily. 01/13/24 04/12/24  Osvaldo Shipper, MD  sevelamer carbonate (RENVELA) 800 MG tablet Take 2,400-5,600 mg by mouth See admin instructions. Take 7 tablets (4000mg ) three times daily with meals and 3 tablets (2400mg ) twice daily with snacks.    [provider]    Discontinued Meds:  There are no discontinued  medications.  Social History:  reports that he has never smoked. He has never used smokeless tobacco. He reports that he does not currently use alcohol. He reports that he does not use drugs.  Family History:   Family History  Problem Relation Age of Onset   Hypertension Mother     Blood pressure 116/71, pulse 85, resp. rate 14, SpO2 97%. GEN: NAD, A&Ox3, NCAT HEENT: No conjunctival pallor, EOMI NECK: Supple, no thyromegaly LUNGS: CTA B/L no rales, rhonchi or wheezing CV: RRR, No M/R/G ABD: SNDNT +BS  EXT: b/l BKA ACCESS: Lt upper arm brachiocephalic fistula with 2 large aneurysms        Orville Mena, Len Blalock, MD 01/21/2024, 7:29 AM

## 2024-01-21 NOTE — Discharge Instructions (Signed)

## 2024-02-10 ENCOUNTER — Ambulatory Visit
Admission: EM | Admit: 2024-02-10 | Discharge: 2024-02-10 | Disposition: A | Discharge status: Home / Self Care | Attending: Family Medicine | Admitting: Family Medicine

## 2024-02-10 ENCOUNTER — Ambulatory Visit: Payer: Self-pay

## 2024-02-10 ENCOUNTER — Encounter: Payer: Self-pay | Admitting: Emergency Medicine

## 2024-02-10 DIAGNOSIS — Z87440 Personal history of urinary (tract) infections: Secondary | ICD-10-CM

## 2024-02-10 DIAGNOSIS — R310 Gross hematuria: Secondary | ICD-10-CM | POA: Diagnosis not present

## 2024-02-10 MED ORDER — CIPROFLOXACIN HCL 500 MG PO TABS: 500.0000 mg | ORAL_TABLET | Freq: Two times a day (BID) | ORAL | 0 refills | Status: DC

## 2024-02-10 NOTE — ED Provider Notes (Signed)
 [facility]    CSN: [id number] Arrival date & time: 02/10/24  0905      History   Chief Complaint No chief complaint on file.   HPI [name] CASHER Sr. is a 63 y.o. male.   Presenting today with 5-day history of gross hematuria, dysuria, urinary frequency and urgency.  Denies fever, chills, flank pain, nausea, vomiting, bowel changes.  So far not trying to thing over-the-counter for symptoms.  Past medical history significant for incisional disease on dialysis, heart failure, diabetes on insulin.  States a similar issue happened about a year ago and he had a urinary tract infection which resolved with antibiotics.    Past Medical History:  Diagnosis Date   ESRD on hemodialysis (HCC)    a. HD MWF > 10 years.   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 01/2022 Echo: EF 45-50%, glob HK, mod LVH, mildly reduced RV fxn, mild MR, mod-sev MV Ca2+. Mild AS; b. 10/2023 Echo: EF 45-50%, glob HK, GrII DD, mildly reduced RV fxn, RVSP 52.72mmHg. Mild MR/MS, mod TR.   Hypertension    Nonischemic cardiomyopathy (HCC)    a. 2019 Cath [facility]): Nonobstructive disease; b. 12/2019 Echo: EF 50%; c. 12/2019 MV (Tx w/u [facility]): Ant infarct w/ peri-infarct ischemia. EF 35%; c. 05/2021 MV (Tx w/u [facility] Cor Ca2+, EF 53%, motion artifact, equiv study, likely low risk.   Nonobstructive coronary artery disease    a.  2019 Cath ([place]): Left main normal, LAD 30 proximal, RCA 30 distal, EF 25%.   Obesity    Type 2 diabetes mellitus (HCC)    a. 10/2023 - admission w/ HHS.    Patient Active Problem List   Diagnosis Date Noted   Orthostatic hypotension 01/12/2024   Iron deficiency anemia 12/01/2023   Ischemic colitis (HCC) 11/27/2023   Bloody diarrhea 11/27/2023   Heme positive stool 11/25/2023   Acute gastroenteropathy due to Norovirus 11/25/2023   Aortic valve stenosis 11/15/2023   Atrial flutter with rapid ventricular response (HCC) 11/10/2023   NSTEMI  (non-ST elevated myocardial infarction) (HCC) 11/08/2023   Hyperglycemia 10/24/2023   Abnormal EKG 10/24/2023   RUQ abdominal pain 10/30/2022   Cholelithiasis 10/29/2022   Hyperbilirubinemia 10/29/2022   Volume overload 10/19/2022   CHF (congestive heart failure) (HCC) 10/18/2022   Hyperosmolar hyperglycemic state (HHS) (HCC)    Diarrhea    Dehiscence of amputation stump of right lower extremity (HCC)    Abscess of right lower leg    Pressure injury of skin 07/02/2022   Cellulitis of right leg 07/01/2022   CAD (coronary artery disease) 05/24/2022   Leukocytosis 05/24/2022   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/23/2022   Obesity (BMI 30-39.9) 02/19/2022   Hypotension 02/08/2022   History of CVA  02/05/2022   Carotid stenosis 02/05/2022   Hyperkalemia 02/03/2022   acute on chronic Anemia of chronic renal failure  02/03/2022   Subacute osteomyelitis, right ankle and foot (HCC) 02/02/2022   Acute metabolic encephalopathy 02/02/2022   Sepsis with encephalopathy and septic shock (HCC) 02/02/2022   Protein-calorie malnutrition, mild (HCC) 02/02/2022   GERD (gastroesophageal reflux disease) 02/02/2022   Acute on chronic systolic CHF (congestive heart failure) (HCC) 08/14/2019   LBBB (left bundle branch block) 08/13/2019   Problem with vascular access 08/13/2019   Dialysis AV fistula malfunction (HCC) 08/12/2019   ESRD on dialysis (HCC) 09/20/2018   DM2 (diabetes mellitus, type 2) (HCC) 09/20/2018   HTN (hypertension) 09/20/2018  Past Surgical History:  Procedure Laterality Date   A/V FISTULAGRAM N/A 08/15/2019   Procedure: A/V FISTULAGRAM;  Surgeon: Maeola Harman, MD;  Location[facility] INVASIVE CV LAB;  Service: Cardiovascular;  Laterality: N/A;   A/V FISTULAGRAM N/A 01/21/2024   Procedure: A/V Fistulagram;  Surgeon: Ethelene Hal, MD;  Location: [facility] INVASIVE CV LAB;  Service: Cardiovascular;  Laterality: N/A;   AMPUTATION Right 02/02/2022   Procedure:  AMPUTATION 5th RAY;  Surgeon: Louann Sjogren, MD;  Location: [facility] OR;  Service: Podiatry;  Laterality: Right;   AMPUTATION Right 05/23/2022   Procedure: RIGHT BELOW KNEE AMPUTATION;  Surgeon: Nadara Mustard, MD;  Location: [facility] OR;  Service: Orthopedics;  Laterality: Right;   AMPUTATION Right 07/04/2022   Procedure: REVISION AMPUTATION BELOW KNEE;  Surgeon: Nadara Mustard, MD;  Location: [facility] OR;  Service: Orthopedics;  Laterality: Right;   APPLICATION OF WOUND VAC  05/23/2022   Procedure: APPLICATION OF WOUND VAC;  Surgeon: Nadara Mustard, MD;  Location: [facility] OR;  Service: Orthopedics;;   AV FISTULA PLACEMENT     CARDIAC CATHETERIZATION  02/17/2018   LHC 02/17/18 ([name]): 30% pLAD, 30% dRCA, LVEDP 25-30, LVEF 25%.     CATARACT EXTRACTION W/PHACO Right 03/29/2020   Procedure: CATARACT EXTRACTION PHACO AND INTRAOCULAR LENS PLACEMENT (IOC)  RIGHT DIABETIC;  Surgeon: Lockie Mola, MD;  Location: [facility] ORS;  Service: Ophthalmology;  Laterality: Right;  Lot # [id number] H [place]: 02:30.9 AP% 9.5% CDE: 14.38   EYE SURGERY Right    cataract removed   [name] N/A 12/02/2023   Procedure: [name];  Surgeon: Imogene Burn, MD;  Location: [facility] ENDOSCOPY;  Service: Gastroenterology;  Laterality: N/A;   I & D EXTREMITY Right 02/02/2022   Procedure: IRRIGATION AND DEBRIDEMENT RIGHT FOOT;  Surgeon: Louann Sjogren, MD;  Location: [facility] OR;  Service: Podiatry;  Laterality: Right;   INCISION AND DRAINAGE OF WOUND Right 02/08/2022   Procedure: IRRIGATION AND DEBRIDEMENT WOUND;  Surgeon: Asencion Islam, DPM;  Location: [facility] OR;  Service: Podiatry;  Laterality: Right;  with removal of infected bone    INSERTION OF DIALYSIS CATHETER  08/13/2019   Procedure: Insertion Of Dialysis Catheter;  Surgeon: Larina Earthly, MD;  Location: [facility] OR;  Service: Vascular;;   INSERTION OF DIALYSIS CATHETER Left 07/19/2020   Procedure: INSERTION OF DIALYSIS CATHETER;  Surgeon: Maeola Harman, MD;  Location:  [facility] OR;  Service: Vascular;  Laterality: Left;   IR LUMBAR PUNCTURE  11/20/2023   IR US GUIDE BX ASP/DRAIN  11/07/2022   IRRIGATION AND DEBRIDEMENT ABSCESS     PERIPHERAL VASCULAR BALLOON ANGIOPLASTY  08/15/2019   Procedure: PERIPHERAL VASCULAR BALLOON ANGIOPLASTY;  Surgeon: Maeola Harman, MD;  Location: [facility] INVASIVE CV LAB;  Service: Cardiovascular;;   PERIPHERAL VASCULAR BALLOON ANGIOPLASTY Left 02/06/2022   Procedure: PERIPHERAL VASCULAR BALLOON ANGIOPLASTY;  Surgeon: Cephus Shelling, MD;  Location: [facility] INVASIVE CV LAB;  Service: Cardiovascular;  Laterality: Left;   PERIPHERAL VASCULAR BALLOON ANGIOPLASTY  01/21/2024   Procedure: PERIPHERAL VASCULAR BALLOON ANGIOPLASTY;  Surgeon: Ethelene Hal, MD;  Location: [facility] INVASIVE CV LAB;  Service: Cardiovascular;;  Cephalic Arch   RADIOLOGY WITH ANESTHESIA N/A 11/20/2023   Procedure: MRI WITH ANESTHESIA;  Surgeon: Radiologist, Medication, MD;  Location: [facility] OR;  Service: Radiology;  Laterality: N/A;   RADIOLOGY WITH ANESTHESIA N/A 11/20/2023   Procedure: IR WITH ANESTHESIA;  Surgeon: Gilmer Mor, DO;  Location: [facility] OR;  Service: Anesthesiology;  Laterality: N/A;  PLEASE OBTAIN OPENING PRESSURE AND  DRAIN AT LEAST 30CC'S OF CSF.   REVISON OF ARTERIOVENOUS FISTULA Left 07/19/2020   Procedure: REVISON/PLICATION OF ARTERIOVENOUS FISTULA LEFT;  Surgeon: Maeola Harman, MD;  Location: [facility] OR;  Service: Vascular;  Laterality: Left;   THROMBECTOMY AND REVISION OF ARTERIOVENTOUS (AV) GORETEX  GRAFT Left 08/13/2019   Procedure: THROMBECTOMY OF LEFT ARM ARTERIOVENTOUS (AV) FISTULA;  Surgeon: Larina Earthly, MD;  Location: [facility] OR;  Service: Vascular;  Laterality: Left;   TOE AMPUTATION         Home Medications    Prior to Admission medications   Medication Sig Start Date End Date Taking? Authorizing Provider  ciprofloxacin (CIPRO) 500 MG tablet Take 1 tablet (500 mg total) by mouth 2 (two) times daily. 02/10/24  Yes Particia Nearing, PA-C   acetaminophen (TYLENOL) 500 MG tablet Take 1,000 mg by mouth 2 (two) times daily as needed for moderate pain (pain score 4-6), headache or fever.    [provider]  aspirin EC 81 MG tablet Take 1 tablet (81 mg total) by mouth daily. Swallow whole. 01/13/24   Osvaldo Shipper, MD  atorvastatin (LIPITOR) 40 MG tablet Take 1 tablet (40 mg total) by mouth daily. 01/13/24 02/12/24  Osvaldo Shipper, MD  B Complex-C-Folic Acid (DIALYVITE 800) 0.8 MG TABS Take 1 tablet by mouth daily.    [provider]  cinacalcet (SENSIPAR) 30 MG tablet Take 30 mg by mouth daily.    [provider]  insulin isophane & regular human KwikPen (NOVOLIN 70/30 KWIKPEN) (70-30) 100 UNIT/ML KwikPen Inject 30U in the morning and 25U in the evening 10/27/23   Briant Cedar, MD  Insulin Pen Needle 32G X 4 MM MISC Use with insulin pen. 10/28/23   Briant Cedar, MD  lidocaine-prilocaine (EMLA) cream Apply 1 Application topically every Monday, Wednesday, and Friday with hemodialysis.    [provider]  midodrine (PROAMATINE) 5 MG tablet Take 2 tablets (10 mg total) by mouth 3 (three) times daily with meals. 01/13/24 04/12/24  Osvaldo Shipper, MD  pantoprazole (PROTONIX) 40 MG tablet Take 1 tablet (40 mg total) by mouth 2 (two) times daily. 01/13/24 04/12/24  Osvaldo Shipper, MD  sevelamer carbonate (RENVELA) 800 MG tablet Take 2,400-5,600 mg by mouth See admin instructions. Take 7 tablets (4000mg ) three times daily with meals and 3 tablets (2400mg ) twice daily with snacks.    [provider]    Family History Family History  Problem Relation Age of Onset   Hypertension Mother     Social History Social History   Tobacco Use   Smoking status: Never   Smokeless tobacco: Never  Vaping Use   Vaping status: Never Used  Substance Use Topics   Alcohol use: Not Currently   Drug use: Never     Allergies   Sulfa antibiotics   Review of Systems Review of Systems Per  HPI  Physical Exam Triage Vital Signs ED Triage Vitals  Encounter Vitals Group     BP 02/10/24 0912 110/62     Systolic BP Percentile --      Diastolic BP Percentile --      Pulse Rate 02/10/24 0912 93     Resp 02/10/24 0912 18     Temp 02/10/24 0912 98.5 F (36.9 C)     Temp Source 02/10/24 0912 Oral     SpO2 02/10/24 0912 93 %     Weight --      Height --      Head Circumference --  Peak Flow --      Pain Score 02/10/24 0913 0     Pain Loc --      Pain Education --      Exclude from Growth Chart --    No data found.  Updated Vital Signs BP 110/62 (BP Location: Right Arm)   Pulse 93   Temp 98.5 F (36.9 C) (Oral)   Resp 18   SpO2 93%   Visual Acuity Right Eye Distance:   Left Eye Distance:   Bilateral Distance:    Right Eye Near:   Left Eye Near:    Bilateral Near:     Physical Exam Vitals and nursing note reviewed.  Constitutional:      Appearance: Normal appearance.  HENT:     Head: Atraumatic.     Mouth/Throat:     Mouth: Mucous membranes are moist.  Eyes:     Extraocular Movements: Extraocular movements intact.     Conjunctiva/sclera: Conjunctivae normal.  Cardiovascular:     Rate and Rhythm: Normal rate.  Pulmonary:     Effort: Pulmonary effort is normal.  Abdominal:     General: Bowel sounds are normal.     Palpations: Abdomen is soft.  Musculoskeletal:     Cervical back: Normal range of motion and neck supple.     Comments: In wheelchair which is his baseline   Skin:    General: Skin is warm and dry.  Neurological:     Mental Status: He is oriented to person, place, and time.  Psychiatric:        Mood and Affect: Mood normal.        Thought Content: Thought content normal.        Judgment: Judgment normal.      UC Treatments / Results  Labs (all labs ordered are listed, but only abnormal results are displayed) Labs Reviewed  POCT URINALYSIS DIP (MANUAL ENTRY)    EKG   Radiology No results  found.  Procedures Procedures (including critical care time)  Medications Ordered in UC Medications - No data to display  Initial Impression / Assessment and Plan / UC Course  I have reviewed the triage vital signs and the nursing notes.  Pertinent labs & imaging results that were available during my care of the patient were reviewed by me and considered in my medical decision making (see chart for details).     Vital signs reassuring today and he is well-appearing and in no acute distress.  He is unfortunately unable to provide [place] a urine sample today, equipment given for home collection as soon as possible and discussed to return sample to clinic for testing.  Will prescribe antibiotics based on symptoms and history of UTI but discussed importance of providing sample prior to initiating antibiotic therapy as this can skew results.  Patient understanding and agreeable to plan.  Return for any worsening symptoms.  Final Clinical Impressions(s) / UC Diagnoses   Final diagnoses:  Gross hematuria  History of UTI     Discharge Instructions      Make sure to provide a urine specimen prior to starting your antibiotics so that we can ensure this is truly what is going on and send off for testing.  I also want you to follow-up with your primary care provider soon as possible for a recheck.  Stay well-hydrated and follow-up for worsening symptoms at any time.    ED Prescriptions     Medication Sig Dispense Auth. Provider   ciprofloxacin (  CIPRO) 500 MG tablet Take 1 tablet (500 mg total) by mouth 2 (two) times daily. 14 tablet Particia Nearing, [place]      PDMP not reviewed this encounter.   Particia Nearing, [place] 02/10/24 1139

## 2024-02-10 NOTE — ED Triage Notes (Signed)
 Has noticed blood in urine since Friday.  Has pain on urination and frequent urination.

## 2024-02-10 NOTE — Discharge Instructions (Signed)
 Make sure to provide a urine specimen prior to starting your antibiotics so that we can ensure this is truly what is going on and send off for testing.  I also want you to follow-up with your primary care provider soon as possible for a recheck.  Stay well-hydrated and follow-up for worsening symptoms at any time.

## 2024-02-11 ENCOUNTER — Encounter (HOSPITAL_COMMUNITY): Payer: Self-pay

## 2024-02-11 ENCOUNTER — Other Ambulatory Visit: Payer: Self-pay

## 2024-02-11 ENCOUNTER — Inpatient Hospital Stay (HOSPITAL_COMMUNITY)
Admission: EM | Admit: 2024-02-11 | Discharge: 2024-02-17 | DRG: 637 | Disposition: A | Discharge status: Home Health | Attending: Family Medicine | Admitting: Family Medicine

## 2024-02-11 DIAGNOSIS — N2581 Secondary hyperparathyroidism of renal origin: Secondary | ICD-10-CM | POA: Diagnosis present

## 2024-02-11 DIAGNOSIS — E875 Hyperkalemia: Secondary | ICD-10-CM | POA: Diagnosis present

## 2024-02-11 DIAGNOSIS — Z882 Allergy status to sulfonamides status: Secondary | ICD-10-CM

## 2024-02-11 DIAGNOSIS — R300 Dysuria: Secondary | ICD-10-CM | POA: Diagnosis present

## 2024-02-11 DIAGNOSIS — Z7982 Long term (current) use of aspirin: Secondary | ICD-10-CM

## 2024-02-11 DIAGNOSIS — R739 Hyperglycemia, unspecified: Secondary | ICD-10-CM | POA: Diagnosis present

## 2024-02-11 DIAGNOSIS — Z91148 Patient's other noncompliance with medication regimen for other reason: Secondary | ICD-10-CM | POA: Diagnosis not present

## 2024-02-11 DIAGNOSIS — I5022 Chronic systolic (congestive) heart failure: Secondary | ICD-10-CM | POA: Diagnosis present

## 2024-02-11 DIAGNOSIS — I10 Essential (primary) hypertension: Secondary | ICD-10-CM | POA: Diagnosis not present

## 2024-02-11 DIAGNOSIS — Z89511 Acquired absence of right leg below knee: Secondary | ICD-10-CM | POA: Diagnosis not present

## 2024-02-11 DIAGNOSIS — I428 Other cardiomyopathies: Secondary | ICD-10-CM | POA: Diagnosis present

## 2024-02-11 DIAGNOSIS — E1122 Type 2 diabetes mellitus with diabetic chronic kidney disease: Secondary | ICD-10-CM | POA: Diagnosis present

## 2024-02-11 DIAGNOSIS — Z794 Long term (current) use of insulin: Secondary | ICD-10-CM

## 2024-02-11 DIAGNOSIS — I951 Orthostatic hypotension: Secondary | ICD-10-CM | POA: Diagnosis present

## 2024-02-11 DIAGNOSIS — S88111S Complete traumatic amputation at level between knee and ankle, right lower leg, sequela: Secondary | ICD-10-CM | POA: Diagnosis not present

## 2024-02-11 DIAGNOSIS — S88111A Complete traumatic amputation at level between knee and ankle, right lower leg, initial encounter: Secondary | ICD-10-CM | POA: Diagnosis present

## 2024-02-11 DIAGNOSIS — Z79899 Other long term (current) drug therapy: Secondary | ICD-10-CM | POA: Diagnosis not present

## 2024-02-11 DIAGNOSIS — Z6829 Body mass index (BMI) 29.0-29.9, adult: Secondary | ICD-10-CM

## 2024-02-11 DIAGNOSIS — I2510 Atherosclerotic heart disease of native coronary artery without angina pectoris: Secondary | ICD-10-CM | POA: Diagnosis present

## 2024-02-11 DIAGNOSIS — I132 Hypertensive heart and chronic kidney disease with heart failure and with stage 5 chronic kidney disease, or end stage renal disease: Secondary | ICD-10-CM | POA: Diagnosis present

## 2024-02-11 DIAGNOSIS — E669 Obesity, unspecified: Secondary | ICD-10-CM | POA: Diagnosis present

## 2024-02-11 DIAGNOSIS — E785 Hyperlipidemia, unspecified: Secondary | ICD-10-CM | POA: Diagnosis present

## 2024-02-11 DIAGNOSIS — N390 Urinary tract infection, site not specified: Secondary | ICD-10-CM | POA: Diagnosis present

## 2024-02-11 DIAGNOSIS — Z6830 Body mass index (BMI) 30.0-30.9, adult: Secondary | ICD-10-CM | POA: Diagnosis not present

## 2024-02-11 DIAGNOSIS — Z91199 Patient's noncompliance with other medical treatment and regimen due to unspecified reason: Secondary | ICD-10-CM

## 2024-02-11 DIAGNOSIS — N186 End stage renal disease: Secondary | ICD-10-CM

## 2024-02-11 DIAGNOSIS — R066 Hiccough: Secondary | ICD-10-CM | POA: Diagnosis present

## 2024-02-11 DIAGNOSIS — E1165 Type 2 diabetes mellitus with hyperglycemia: Secondary | ICD-10-CM | POA: Diagnosis not present

## 2024-02-11 DIAGNOSIS — Z89512 Acquired absence of left leg below knee: Secondary | ICD-10-CM | POA: Diagnosis not present

## 2024-02-11 DIAGNOSIS — Z992 Dependence on renal dialysis: Secondary | ICD-10-CM | POA: Diagnosis not present

## 2024-02-11 DIAGNOSIS — R319 Hematuria, unspecified: Secondary | ICD-10-CM | POA: Diagnosis present

## 2024-02-11 DIAGNOSIS — Z8249 Family history of ischemic heart disease and other diseases of the circulatory system: Secondary | ICD-10-CM

## 2024-02-11 DIAGNOSIS — E119 Type 2 diabetes mellitus without complications: Secondary | ICD-10-CM

## 2024-02-11 DIAGNOSIS — D631 Anemia in chronic kidney disease: Secondary | ICD-10-CM | POA: Diagnosis present

## 2024-02-11 DIAGNOSIS — I959 Hypotension, unspecified: Secondary | ICD-10-CM | POA: Diagnosis not present

## 2024-02-11 DIAGNOSIS — E871 Hypo-osmolality and hyponatremia: Secondary | ICD-10-CM | POA: Diagnosis present

## 2024-02-11 DIAGNOSIS — E0865 Diabetes mellitus due to underlying condition with hyperglycemia: Secondary | ICD-10-CM | POA: Diagnosis not present

## 2024-02-11 DIAGNOSIS — R0682 Tachypnea, not elsewhere classified: Secondary | ICD-10-CM | POA: Diagnosis present

## 2024-02-11 DIAGNOSIS — K219 Gastro-esophageal reflux disease without esophagitis: Secondary | ICD-10-CM | POA: Diagnosis present

## 2024-02-11 DIAGNOSIS — R3915 Urgency of urination: Secondary | ICD-10-CM | POA: Diagnosis present

## 2024-02-11 LAB — HEMOGLOBIN A1C
Hgb A1c MFr Bld: 8.4 % — ABNORMAL HIGH (ref 4.8–5.6)
Mean Plasma Glucose: 194.38 mg/dL

## 2024-02-11 LAB — COMPREHENSIVE METABOLIC PANEL WITH GFR
ALT: 18 U/L (ref 0–44)
AST: 17 U/L (ref 15–41)
Albumin: 3.4 g/dL — ABNORMAL LOW (ref 3.5–5.0)
Alkaline Phosphatase: 137 U/L — ABNORMAL HIGH (ref 38–126)
Anion gap: 19 — ABNORMAL HIGH (ref 5–15)
BUN: 34 mg/dL — ABNORMAL HIGH (ref 8–23)
CO2: 26 mmol/L (ref 22–32)
Calcium: 8.9 mg/dL (ref 8.9–10.3)
Chloride: 80 mmol/L — ABNORMAL LOW (ref 98–111)
Creatinine, Ser: 8.7 mg/dL — ABNORMAL HIGH (ref 0.61–1.24)
GFR, Estimated: 6 mL/min — ABNORMAL LOW (ref >60)
Glucose, Bld: 530 mg/dL (ref 70–99)
Potassium: 4.7 mmol/L (ref 3.5–5.1)
Sodium: 125 mmol/L — ABNORMAL LOW (ref 135–145)
Total Bilirubin: 1.1 mg/dL (ref 0.0–1.2)
Total Protein: 9.4 g/dL — ABNORMAL HIGH (ref 6.5–8.1)

## 2024-02-11 LAB — CBC
HCT: 43 % (ref 39.0–52.0)
Hemoglobin: 13.7 g/dL (ref 13.0–17.0)
MCH: 27.7 pg (ref 26.0–34.0)
MCHC: 31.9 g/dL (ref 30.0–36.0)
MCV: 87 fL (ref 80.0–100.0)
Platelets: 252 10*3/uL (ref 150–400)
RBC: 4.94 MIL/uL (ref 4.22–5.81)
RDW: 16.6 % — ABNORMAL HIGH (ref 11.5–15.5)
WBC: 13.6 10*3/uL — ABNORMAL HIGH (ref 4.0–10.5)
nRBC: 0 % (ref 0.0–0.2)

## 2024-02-11 LAB — BETA-HYDROXYBUTYRIC ACID: Beta-Hydroxybutyric Acid: 0.14 mmol/L (ref 0.05–0.27)

## 2024-02-11 LAB — BLOOD GAS, VENOUS
Acid-Base Excess: 6.8 mmol/L — ABNORMAL HIGH (ref 0.0–2.0)
Bicarbonate: 33 mmol/L — ABNORMAL HIGH (ref 20.0–28.0)
Drawn by: 7012
O2 Saturation: 32.3 %
Patient temperature: 37.2
pCO2, Ven: 52 mmHg (ref 44–60)
pH, Ven: 7.41 (ref 7.25–7.43)
pO2, Ven: <31 mmHg — CL (ref 32–45)

## 2024-02-11 LAB — CBG MONITORING, ED
Glucose-Capillary: 564 mg/dL (ref 70–99)
Glucose-Capillary: 530 mg/dL (ref 70–99)
Glucose-Capillary: 512 mg/dL (ref 70–99)
Glucose-Capillary: 303 mg/dL — ABNORMAL HIGH (ref 70–99)

## 2024-02-11 LAB — PHOSPHORUS: Phosphorus: 4.7 mg/dL — ABNORMAL HIGH (ref 2.5–4.6)

## 2024-02-11 LAB — GLUCOSE, CAPILLARY: Glucose-Capillary: 291 mg/dL — ABNORMAL HIGH (ref 70–99)

## 2024-02-11 MED ORDER — INSULIN ASPART 100 UNIT/ML IJ SOLN
0.0000 [IU] | Freq: Every day | INTRAMUSCULAR | Status: DC
Start: 2024-02-11 — End: 2024-02-17
  Administered 2024-02-11: 3 [IU] via SUBCUTANEOUS
  Administered 2024-02-12: 2 [IU] via SUBCUTANEOUS

## 2024-02-11 MED ORDER — BISACODYL 10 MG RE SUPP
10.0000 mg | Freq: Every day | RECTAL | Status: DC | PRN
  Filled 2024-02-11: qty 1

## 2024-02-11 MED ORDER — MIDODRINE HCL 5 MG PO TABS
10.0000 mg | ORAL_TABLET | Freq: Three times a day (TID) | ORAL | Status: DC
  Administered 2024-02-12 (×2): 10 mg via ORAL
  Filled 2024-02-11 (×2): qty 2

## 2024-02-11 MED ORDER — INSULIN ASPART 100 UNIT/ML IV SOLN
5.0000 [IU] | Freq: Once | INTRAVENOUS | Status: AC
  Administered 2024-02-11: 5 [IU] via INTRAVENOUS

## 2024-02-11 MED ORDER — DOXERCALCIFEROL 4 MCG/2ML IV SOLN
5.0000 ug | INTRAVENOUS | Status: DC
  Administered 2024-02-13 – 2024-02-17 (×3): 5 ug via INTRAVENOUS

## 2024-02-11 MED ORDER — MIDODRINE HCL 5 MG PO TABS
10.0000 mg | ORAL_TABLET | Freq: Once | ORAL | Status: AC
  Administered 2024-02-11: 10 mg via ORAL
  Filled 2024-02-11: qty 2

## 2024-02-11 MED ORDER — CHLORHEXIDINE GLUCONATE CLOTH 2 % EX PADS
6.0000 | MEDICATED_PAD | Freq: Every day | CUTANEOUS | Status: DC
  Administered 2024-02-12 – 2024-02-17 (×6): 6 via TOPICAL

## 2024-02-11 MED ORDER — HEPARIN SODIUM (PORCINE) 5000 UNIT/ML IJ SOLN
5000.0000 [IU] | Freq: Three times a day (TID) | INTRAMUSCULAR | Status: DC
  Administered 2024-02-12 – 2024-02-17 (×15): 5000 [IU] via SUBCUTANEOUS
  Filled 2024-02-11 (×15): qty 1

## 2024-02-11 MED ORDER — ACETAMINOPHEN 650 MG RE SUPP: 650.0000 mg | Freq: Four times a day (QID) | RECTAL | Status: DC | PRN

## 2024-02-11 MED ORDER — ALBUTEROL SULFATE (2.5 MG/3ML) 0.083% IN NEBU: 2.5000 mg | INHALATION_SOLUTION | RESPIRATORY_TRACT | Status: DC | PRN

## 2024-02-11 MED ORDER — SODIUM CHLORIDE 0.9 % IV BOLUS
500.0000 mL | Freq: Once | INTRAVENOUS | Status: AC
  Administered 2024-02-11: 500 mL via INTRAVENOUS

## 2024-02-11 MED ORDER — ASPIRIN 81 MG PO TBEC
81.0000 mg | DELAYED_RELEASE_TABLET | Freq: Every day | ORAL | Status: DC
  Administered 2024-02-11 – 2024-02-17 (×7): 81 mg via ORAL
  Filled 2024-02-11 (×7): qty 1

## 2024-02-11 MED ORDER — POLYETHYLENE GLYCOL 3350 17 G PO PACK: 17.0000 g | PACK | Freq: Every day | ORAL | Status: DC | PRN

## 2024-02-11 MED ORDER — ACETAMINOPHEN 325 MG PO TABS
650.0000 mg | ORAL_TABLET | Freq: Four times a day (QID) | ORAL | Status: DC | PRN
  Administered 2024-02-17 (×2): 650 mg via ORAL
  Filled 2024-02-11 (×2): qty 2

## 2024-02-11 MED ORDER — INSULIN ASPART PROT & ASPART (70-30 MIX) 100 UNIT/ML ~~LOC~~ SUSP
20.0000 [IU] | Freq: Two times a day (BID) | SUBCUTANEOUS | Status: DC
  Administered 2024-02-12: 20 [IU] via SUBCUTANEOUS
  Filled 2024-02-11: qty 10

## 2024-02-11 MED ORDER — ATORVASTATIN CALCIUM 40 MG PO TABS
40.0000 mg | ORAL_TABLET | Freq: Every day | ORAL | Status: DC
  Administered 2024-02-12 – 2024-02-17 (×6): 40 mg via ORAL
  Filled 2024-02-11 (×6): qty 1

## 2024-02-11 MED ORDER — INSULIN ASPART 100 UNIT/ML IJ SOLN
10.0000 [IU] | Freq: Once | INTRAMUSCULAR | Status: AC
  Administered 2024-02-11: 10 [IU] via SUBCUTANEOUS
  Filled 2024-02-11: qty 1

## 2024-02-11 MED ORDER — CINACALCET HCL 30 MG PO TABS
60.0000 mg | ORAL_TABLET | Freq: Every day | ORAL | Status: DC
  Administered 2024-02-12 – 2024-02-17 (×6): 60 mg via ORAL
  Filled 2024-02-11 (×6): qty 2

## 2024-02-11 MED ORDER — PANTOPRAZOLE SODIUM 40 MG PO TBEC
40.0000 mg | DELAYED_RELEASE_TABLET | Freq: Every day | ORAL | Status: DC
  Administered 2024-02-11 – 2024-02-17 (×7): 40 mg via ORAL
  Filled 2024-02-11 (×7): qty 1

## 2024-02-11 MED ORDER — INSULIN ASPART 100 UNIT/ML IJ SOLN
0.0000 [IU] | Freq: Three times a day (TID) | INTRAMUSCULAR | Status: DC
  Administered 2024-02-12: 3 [IU] via SUBCUTANEOUS
  Administered 2024-02-12: 11 [IU] via SUBCUTANEOUS
  Administered 2024-02-12: 2 [IU] via SUBCUTANEOUS
  Administered 2024-02-13: 3 [IU] via SUBCUTANEOUS
  Administered 2024-02-14: 8 [IU] via SUBCUTANEOUS
  Administered 2024-02-14: 3 [IU] via SUBCUTANEOUS
  Administered 2024-02-14: 8 [IU] via SUBCUTANEOUS
  Administered 2024-02-15 (×2): 3 [IU] via SUBCUTANEOUS
  Administered 2024-02-15: 5 [IU] via SUBCUTANEOUS
  Administered 2024-02-16 (×3): 2 [IU] via SUBCUTANEOUS
  Administered 2024-02-17: 8 [IU] via SUBCUTANEOUS

## 2024-02-11 MED ORDER — SODIUM CHLORIDE 0.9 % IV SOLN
1.0000 g | INTRAVENOUS | Status: DC
  Administered 2024-02-11: 1 g via INTRAVENOUS
  Filled 2024-02-11: qty 10

## 2024-02-11 MED ORDER — SEVELAMER CARBONATE 800 MG PO TABS: 2400.0000 mg | ORAL_TABLET | ORAL | Status: DC

## 2024-02-11 NOTE — ED Provider Notes (Cosign Needed Addendum)
 [place] EMERGENCY DEPARTMENT AT [facility] Provider Note   CSN: [id number] Arrival date & time: 02/11/24  1340     History  Chief Complaint  Patient presents with   Hyperglycemia   Hematuria    [name] COLLERAN Sr. is a 63 y.o. male.  The patient presents to the ED reporting hematuria for the last 6 days. EMS reports he is hypotensive. 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 He reports he has not taken his insulin but is not able to say when he last took it. He denies fever, chest pain, abdominal pain, nausea, vomiting.   The history is provided by the patient and the EMS personnel. No language interpreter was used.  Hyperglycemia Hematuria       Home Medications Prior to Admission medications   Medication Sig Start Date End Date Taking? Authorizing Provider  acetaminophen (TYLENOL) 500 MG tablet Take 1,000 mg by mouth 2 (two) times daily as needed for moderate pain (pain score 4-6), headache or fever.    [provider]  aspirin EC 81 MG tablet Take 1 tablet (81 mg total) by mouth daily. Swallow whole. 01/13/24   Osvaldo Shipper, MD  atorvastatin (LIPITOR) 40 MG tablet Take 1 tablet (40 mg total) by mouth daily. 01/13/24 02/12/24  Osvaldo Shipper, MD  B Complex-C-Folic Acid (DIALYVITE 800) 0.8 MG TABS Take 1 tablet by mouth daily.    [provider]  benzonatate (TESSALON) 200 MG capsule Take 200 mg by mouth at bedtime. 01/11/24   [provider]  cinacalcet (SENSIPAR) 60 MG tablet Take 60 mg by mouth daily. 02/03/24   [provider]  ciprofloxacin (CIPRO) 500 MG tablet Take 1 tablet (500 mg total) by mouth 2 (two) times daily. 02/10/24   Particia Nearing, PA-C  insulin isophane & regular human KwikPen (NOVOLIN 70/30 KWIKPEN) (70-30) 100 UNIT/ML KwikPen Inject 30U in the morning and 25U in the evening 10/27/23   Briant Cedar, MD  Insulin Pen Needle 32G X 4 MM MISC Use with insulin pen.  10/28/23   Briant Cedar, MD  lidocaine-prilocaine (EMLA) cream Apply 1 Application topically every Monday, Wednesday, and Friday with hemodialysis.    [provider]  midodrine (PROAMATINE) 5 MG tablet Take 2 tablets (10 mg total) by mouth 3 (three) times daily with meals. 01/13/24 04/12/24  Osvaldo Shipper, MD  pantoprazole (PROTONIX) 40 MG tablet Take 1 tablet (40 mg total) by mouth 2 (two) times daily. 01/13/24 04/12/24  Osvaldo Shipper, MD  sevelamer carbonate (RENVELA) 800 MG tablet Take 2,400-5,600 mg by mouth See admin instructions. Take 7 tablets (4000mg ) three times daily with meals and 3 tablets (2400mg ) twice daily with snacks.    [provider]      Allergies    Sulfa antibiotics    Review of Systems   Review of Systems  Genitourinary:  Positive for hematuria.    Physical Exam Updated Vital Signs BP 114/63   Pulse 90   Temp 98.9 F (37.2 C) (Oral)   Resp 13   Ht 6\' 1"  (1.854 m)   Wt 105.7 kg   SpO2 94%   BMI 30.74 kg/m  Physical Exam Vitals and nursing note reviewed.  Constitutional:      Appearance: He is obese.     Comments: Patient answers questions appropriately, but does not open eyes during entire H&P.  HENT:     Head: Normocephalic.     Mouth/Throat:  Mouth: Mucous membranes are moist.  Cardiovascular:     Rate and Rhythm: Normal rate.  Pulmonary:     Effort: Pulmonary effort is normal. No respiratory distress.  Abdominal:     General: There is no distension.     Palpations: Abdomen is soft.     Tenderness: There is no abdominal tenderness.  Skin:    General: Skin is warm and dry.  Neurological:     Mental Status: He is alert and oriented to person, place, and time.     ED Results / Procedures / Treatments   Labs (all labs ordered are listed, but only abnormal results are displayed) Labs Reviewed  CBC - Abnormal; Notable for the following components:      Result Value   WBC 13.6 (*)    RDW 16.6 (*)    All other  components within normal limits  COMPREHENSIVE METABOLIC PANEL WITH GFR - Abnormal; Notable for the following components:   Sodium 125 (*)    Chloride 80 (*)    Glucose, Bld 530 (*)    BUN 34 (*)    Creatinine, Ser 8.70 (*)    Total Protein 9.4 (*)    Albumin 3.4 (*)    Alkaline Phosphatase 137 (*)    GFR, Estimated 6 (*)    Anion gap 19 (*)    All other components within normal limits  BLOOD GAS, VENOUS - Abnormal; Notable for the following components:   pO2, Ven <31 (*)    Bicarbonate 33.0 (*)    Acid-Base Excess 6.8 (*)    All other components within normal limits  CBG MONITORING, ED - Abnormal; Notable for the following components:   Glucose-Capillary 564 (*)    All other components within normal limits  CBG MONITORING, ED - Abnormal; Notable for the following components:   Glucose-Capillary 530 (*)    All other components within normal limits  CBG MONITORING, ED - Abnormal; Notable for the following components:   Glucose-Capillary 512 (*)    All other components within normal limits  AEROBIC CULTURE W GRAM STAIN (SUPERFICIAL SPECIMEN)  GC/CHLAMYDIA PROBE AMP ([place]) NOT AT [facility]   Results for orders placed or performed during the hospital encounter of 02/11/24  CBC   Collection Time: 02/11/24  2:27 PM  Result Value Ref Range   WBC 13.6 (H) 4.0 - 10.5 K/uL   RBC 4.94 4.22 - 5.81 MIL/uL   Hemoglobin 13.7 13.0 - 17.0 g/dL   HCT 46.9 62.9 - 52.8 %   MCV 87.0 80.0 - 100.0 fL   MCH 27.7 26.0 - 34.0 pg   MCHC 31.9 30.0 - 36.0 g/dL   RDW 41.3 (H) 24.4 - 01.0 %   Platelets 252 150 - 400 K/uL   nRBC 0.0 0.0 - 0.2 %  Comprehensive metabolic panel   Collection Time: 02/11/24  2:27 PM  Result Value Ref Range   Sodium 125 (L) 135 - 145 mmol/L   Potassium 4.7 3.5 - 5.1 mmol/L   Chloride 80 (L) 98 - 111 mmol/L   CO2 26 22 - 32 mmol/L   Glucose, Bld 530 (HH) 70 - 99 mg/dL   BUN 34 (H) 8 - 23 mg/dL   Creatinine, Ser 2.72 (H) 0.61 - 1.24 mg/dL   Calcium 8.9 8.9 - 53.6  mg/dL   Total Protein 9.4 (H) 6.5 - 8.1 g/dL   Albumin 3.4 (L) 3.5 - 5.0 g/dL   AST 17 15 - 41 U/L   ALT 18  0 - 44 U/L   Alkaline Phosphatase 137 (H) 38 - 126 U/L   Total Bilirubin 1.1 0.0 - 1.2 mg/dL   GFR, Estimated 6 (L) >60 mL/min   Anion gap 19 (H) 5 - 15  CBG monitoring, ED   Collection Time: 02/11/24  2:34 PM  Result Value Ref Range   Glucose-Capillary 564 (HH) 70 - 99 mg/dL   Comment 1 Notify RN   Blood gas, venous   Collection Time: 02/11/24  3:06 PM  Result Value Ref Range   pH, Ven 7.41 7.25 - 7.43   pCO2, Ven 52 44 - 60 mmHg   pO2, Ven <31 (LL) 32 - 45 mmHg   Bicarbonate 33.0 (H) 20.0 - 28.0 mmol/L   Acid-Base Excess 6.8 (H) 0.0 - 2.0 mmol/L   O2 Saturation 32.3 %   Patient temperature 37.2    Collection site RIGHT ANTECUBITAL    Drawn by [id number]   POC CBG, ED   Collection Time: 02/11/24  4:06 PM  Result Value Ref Range   Glucose-Capillary 530 (HH) 70 - 99 mg/dL   Comment 1 Notify RN   CBG monitoring, ED   Collection Time: 02/11/24  5:23 PM  Result Value Ref Range   Glucose-Capillary 512 (HH) 70 - 99 mg/dL    EKG None  Radiology No results found.  Procedures Procedures    Medications Ordered in ED Medications  midodrine (PROAMATINE) tablet 10 mg (10 mg Oral Given 02/11/24 1436)  sodium chloride 0.9 % bolus 500 mL (500 mLs Intravenous New Bag/Given 02/11/24 1514)  insulin aspart (novoLOG) injection 10 Units (10 Units Subcutaneous Given 02/11/24 1624)    ED Course/ Medical Decision Making/ A&P                                 Medical Decision Making This patient presents to the ED for concern of blood from penis, this involves an extensive number of treatment options, and is a complaint that carries with it a high risk of complications and morbidity.  The differential diagnosis includes bladder CA, infection, injury   Co morbidities that complicate the patient evaluation  ESRD-HD, T2DM, HTN, NICM, CHF, nonobstructive CAD   Additional history  obtained:  Additional history and/or information obtained from chart review, notable for Seen by primary care yesterday for same complaint but including urinary frequency and urgency. Unable to collect specimen in office so was discharged to outpatient follow up.   Brought in today EMS who reports hypotension and hyperglycemia.   Lab Tests:  I Ordered, and personally interpreted labs.  The pertinent results include:   VBG: pH 7.41; BiCarb 33, Acid/base excess 6.8 Anion Gap 19 Glucose 530 Na 125 Cl 80 WBC 13.6, normal hemoglobin    Imaging Studies ordered:  I ordered imaging studies including n/a I independently visualized and interpreted imaging which showed n/a I agree with the radiologist interpretation   Cardiac Monitoring:  The patient was maintained on a cardiac monitor.  I personally viewed and interpreted the cardiac monitored which showed an underlying rhythm of: n/a   Medicines ordered and prescription drug management:  I ordered medication including Midodrine  for hypotension Reevaluation of the patient after these medicines showed that the patient improved I have reviewed the patients home medicines and have made adjustments as needed   Test Considered:  Insulin provided, 10 U SQ for persistent hyperglycemia in the setting of noncompliance IV fluids, midodrine  for hypotension with improvement He reports blood from penis (does not make urine) - exam showing discharge without blood but small area of dried blood on glans - culture pending    Critical Interventions:  N/a   Consultations Obtained:  I requested consultation with the ED Attending, Dr. Eloise Harman, to discuss presentation,  and discussed lab and imaging findings as well as pertinent plan - they recommend: he has reviewed the patient's ED course, labs and recommends admission for hyperglycemia w/o DKA, hypotension - for stabilization prior to being discharged home.    Problem List / ED  Course:  Presents for blood from penis x 6 days - has discharge on exam without active bleeding Hypotensive on arrival - better in small amount fluids (500 cc) and midodrine ordered by Dr. Rhae Hammock Hyperglycemia to 530 - not taking insulin SQ insulin provided - will recheck  Remains hyperglycemia after insulin Given that he is a dialysis patient as a complicating factor, it is felt he will need stabilization of his blood sugar prior to discharge. He has also not been taking his Midodrine for unknown reasons and presents hypotensive this visit.   C/o blood from his penis x 7 days as well. Consider urology consultation this admission.    Reevaluation:  After the interventions noted above, I reevaluated the patient and found that they have :improved   Social Determinants of Health:  Noncompliant with medications   Disposition:  After consideration of the diagnostic results and the patients response to treatment, I feel that the patient would benefit from admit.Earlyne Iba hospitalist for admission who requested a bladder scan and attempt at In-and-out catheter to help determine the source of bleeding. This was done, no urine present. Patient admitted.    Amount and/or Complexity of Data Reviewed Labs: ordered.  Risk Prescription drug management. Decision regarding hospitalization.           Final Clinical Impression(s) / ED Diagnoses Final diagnoses:  Hyperglycemia  Hypotension, unspecified hypotension type    Rx / DC Orders ED Discharge Orders     None         Elpidio Anis, PA-C 02/11/24 1755    Elpidio Anis, PA-C 02/11/24 1903    Durwin Glaze, MD 02/12/24 [phone]

## 2024-02-11 NOTE — ED Notes (Signed)
 ED TO INPATIENT HANDOFF REPORT  ED Nurse Name and Phone #: Demari Kropp RN   S Name/Age/Gender [name] Lance Sr. 63 y.o. male Room/Bed: APA05/APA05  Code Status   Code Status: Full Code  Home/SNF/Other Home Patient oriented to: self, place, time, and situation Is this baseline? Yes   Triage Complete: Triage complete  Chief Complaint Uncontrolled diabetes mellitus with hyperglycemia (HCC) [E11.65]  Triage Note Pt [facility]/o blood from penis x 6 days. [facility] stated pt CBG 591 an DTaP was supposed to take insulin but did not. Pt had dialysis yesterday full tx per pt. Pt noted to be hypotensive upon arrival.    Allergies Allergies  Allergen Reactions   Sulfa Antibiotics Diarrhea and Nausea And Vomiting    Level of Care/Admitting Diagnosis ED Disposition     ED Disposition  Admit   Condition  --   Comment  Hospital Area: [facility] [[id number]]  Level of Care: Telemetry [5]  Covid Evaluation: Confirmed COVID Negative  Diagnosis: Uncontrolled diabetes mellitus with hyperglycemia [facility]) [[id number]]  Admitting Physician: Chiquita Loth  Attending Physician: Chiquita Loth  Certification:: I certify this patient will need inpatient services for at least 2 midnights  Expected Medical Readiness: 02/13/2024          B Medical/Surgery History Past Medical History:  Diagnosis Date   ESRD on hemodialysis (HCC)    a. HD MWF > 10 years.   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 01/2022 Echo: EF 45-50%, glob HK, mod LVH, mildly reduced RV fxn, mild MR, mod-sev MV Ca2+. Mild AS; b. 10/2023 Echo: EF 45-50%, glob HK, GrII DD, mildly reduced RV fxn, RVSP 52.56mmHg. Mild MR/MS, mod TR.   Hypertension    Nonischemic cardiomyopathy (HCC)    a. 2019 Cath [facility]): Nonobstructive disease; b. 12/2019 Echo: EF 50%; c. 12/2019 MV (Tx w/u [facility]): Ant infarct w/ peri-infarct ischemia. EF 35%; c. 05/2021 MV (Tx w/u [facility]): Cor Ca2+, EF  53%, motion artifact, equiv study, likely low risk.   Nonobstructive coronary artery disease    a.  2019 Cath ([place]): Left main normal, LAD 30 proximal, RCA 30 distal, EF 25%.   Obesity    Type 2 diabetes mellitus (HCC)    a. 10/2023 - admission w/ [facility].   Past Surgical History:  Procedure Laterality Date   A/V FISTULAGRAM N/A 08/15/2019   Procedure: A/V FISTULAGRAM;  Surgeon: Maeola Harman, MD;  Location: [facility] INVASIVE CV LAB;  Service: Cardiovascular;  Laterality: N/A;   A/V FISTULAGRAM N/A 01/21/2024   Procedure: A/V Fistulagram;  Surgeon: Ethelene Hal, MD;  Location: [facility] INVASIVE CV LAB;  Service: Cardiovascular;  Laterality: N/A;   AMPUTATION Right 02/02/2022   Procedure: AMPUTATION 5th RAY;  Surgeon: Louann Sjogren, MD;  Location: [facility] OR;  Service: Podiatry;  Laterality: Right;   AMPUTATION Right 05/23/2022   Procedure: RIGHT BELOW KNEE AMPUTATION;  Surgeon: Nadara Mustard, MD;  Location: [facility] OR;  Service: Orthopedics;  Laterality: Right;   AMPUTATION Right 07/04/2022   Procedure: REVISION AMPUTATION BELOW KNEE;  Surgeon: Nadara Mustard, MD;  Location: [facility] OR;  Service: Orthopedics;  Laterality: Right;   APPLICATION OF WOUND VAC  05/23/2022   Procedure: APPLICATION OF WOUND VAC;  Surgeon: Nadara Mustard, MD;  Location: [facility] OR;  Service: Orthopedics;;   AV FISTULA PLACEMENT     CARDIAC CATHETERIZATION  02/17/2018   LHC 02/17/18 ([name]): 30% pLAD, 30% dRCA,  LVEDP 25-30, LVEF 25%.     CATARACT EXTRACTION W/PHACO Right 03/29/2020   Procedure: CATARACT EXTRACTION PHACO AND INTRAOCULAR LENS PLACEMENT (IOC)  RIGHT DIABETIC;  Surgeon: Lockie Mola, MD;  Location: [facility] ORS;  Service: Ophthalmology;  Laterality: Right;  Lot # [id number] H [place]: 02:30.9 AP% 9.5% CDE: 14.38   EYE SURGERY Right    cataract removed   [name] N/A 12/02/2023   Procedure: [name];  Surgeon: Imogene Burn, MD;  Location: [facility] ENDOSCOPY;  Service: Gastroenterology;   Laterality: N/A;   I & D EXTREMITY Right 02/02/2022   Procedure: IRRIGATION AND DEBRIDEMENT RIGHT FOOT;  Surgeon: Louann Sjogren, MD;  Location: [facility] OR;  Service: Podiatry;  Laterality: Right;   INCISION AND DRAINAGE OF WOUND Right 02/08/2022   Procedure: IRRIGATION AND DEBRIDEMENT WOUND;  Surgeon: Asencion Islam, DPM;  Location: [facility] OR;  Service: Podiatry;  Laterality: Right;  with removal of infected bone    INSERTION OF DIALYSIS CATHETER  08/13/2019   Procedure: Insertion Of Dialysis Catheter;  Surgeon: Larina Earthly, MD;  Location: [facility] OR;  Service: Vascular;;   INSERTION OF DIALYSIS CATHETER Left 07/19/2020   Procedure: INSERTION OF DIALYSIS CATHETER;  Surgeon: Maeola Harman, MD;  Location: [facility] OR;  Service: Vascular;  Laterality: Left;   IR LUMBAR PUNCTURE  11/20/2023   IR US GUIDE BX ASP/DRAIN  11/07/2022   IRRIGATION AND DEBRIDEMENT ABSCESS     PERIPHERAL VASCULAR BALLOON ANGIOPLASTY  08/15/2019   Procedure: PERIPHERAL VASCULAR BALLOON ANGIOPLASTY;  Surgeon: Maeola Harman, MD;  Location: [facility] INVASIVE CV LAB;  Service: Cardiovascular;;   PERIPHERAL VASCULAR BALLOON ANGIOPLASTY Left 02/06/2022   Procedure: PERIPHERAL VASCULAR BALLOON ANGIOPLASTY;  Surgeon: Cephus Shelling, MD;  [facility]: [facility] INVASIVE CV LAB;  Service: Cardiovascular;  Laterality: Left;   PERIPHERAL VASCULAR BALLOON ANGIOPLASTY  01/21/2024   Procedure: PERIPHERAL VASCULAR BALLOON ANGIOPLASTY;  Surgeon: Ethelene Hal, MD;  [facility]: [facility] INVASIVE CV LAB;  Service: Cardiovascular;;  Cephalic Arch   RADIOLOGY WITH ANESTHESIA N/A 11/20/2023   Procedure: MRI WITH ANESTHESIA;  Surgeon: Radiologist, Medication, MD;  Location: [facility] OR;  Service: Radiology;  Laterality: N/A;   RADIOLOGY WITH ANESTHESIA N/A 11/20/2023   Procedure: IR WITH ANESTHESIA;  Surgeon: Gilmer Mor, DO;  Location: [facility] OR;  Service: Anesthesiology;  Laterality: N/A;  PLEASE OBTAIN OPENING PRESSURE AND DRAIN AT LEAST 30CC'S OF CSF.   REVISON OF  ARTERIOVENOUS FISTULA Left 07/19/2020   Procedure: REVISON/PLICATION OF ARTERIOVENOUS FISTULA LEFT;  Surgeon: Maeola Harman, MD;  Location: [facility] OR;  Service: Vascular;  Laterality: Left;   THROMBECTOMY AND REVISION OF ARTERIOVENTOUS (AV) GORETEX  GRAFT Left 08/13/2019   Procedure: THROMBECTOMY OF LEFT ARM ARTERIOVENTOUS (AV) FISTULA;  Surgeon: Larina Earthly, MD;  Location: [facility] OR;  Service: Vascular;  Laterality: Left;   TOE AMPUTATION       A IV Location/Drains/Wounds Patient Lines/Drains/Airways Status     Active Line/Drains/Airways     Name Placement date Placement time Site Days   Peripheral IV 02/11/24 20 G 1" Right Antecubital 02/11/24  1429  Antecubital  less than 1   Fistula / Graft Left Upper arm Arteriovenous fistula 09/22/15  0114  Upper arm  3064   Wound / Incision (Open or Dehisced) 11/17/23 Skin tear Perineum Medial 0.5 cm x 2 cm 11/17/23  0235  Perineum  86   Wound / Incision (Open or Dehisced) 11/25/23 Skin tear Perineum Right;Lower 11/25/23  0100  Perineum  78  Intake/Output Last 24 hours No intake or output data in the 24 hours ending 02/11/24 1851  Labs/Imaging Results for orders placed or performed during the hospital encounter of 02/11/24 (from the past 48 hours)  CBC     Status: Abnormal   Collection Time: 02/11/24  2:27 PM  Result Value Ref Range   WBC 13.6 (H) 4.0 - 10.5 K/uL   RBC 4.94 4.22 - 5.81 MIL/uL   Hemoglobin 13.7 13.0 - 17.0 g/dL   HCT 16.1 09.6 - 04.5 %   MCV 87.0 80.0 - 100.0 fL   MCH 27.7 26.0 - 34.0 pg   MCHC 31.9 30.0 - 36.0 g/dL   RDW 40.9 (H) 81.1 - 91.4 %   Platelets 252 150 - 400 K/uL   nRBC 0.0 0.0 - 0.2 %    Comment: Performed at [facility], [address], [place], [place] [address]  Comprehensive metabolic panel     Status: Abnormal   Collection Time: 02/11/24  2:27 PM  Result Value Ref Range   Sodium 125 (L) 135 - 145 mmol/L   Potassium 4.7 3.5 - 5.1 mmol/L   Chloride 80 (L) 98 - 111 mmol/L   CO2 26 22  - 32 mmol/L   Glucose, Bld 530 (HH) 70 - 99 mg/dL    Comment: CRITICAL RESULT CALLED TO, READ BACK BY AND VERIFIED WITH Kamiah Fite,C ON 02/11/24 AT 1535 BY LOY,C Glucose reference range applies only to samples taken after fasting for at least 8 hours.    BUN 34 (H) 8 - 23 mg/dL   Creatinine, Ser 6.21 (H) 0.61 - 1.24 mg/dL   Calcium 8.9 8.9 - 30.8 mg/dL   Total Protein 9.4 (H) 6.5 - 8.1 g/dL   Albumin 3.4 (L) 3.5 - 5.0 g/dL   AST 17 15 - 41 U/L   ALT 18 0 - 44 U/L   Alkaline Phosphatase 137 (H) 38 - 126 U/L   Total Bilirubin 1.1 0.0 - 1.2 mg/dL   GFR, Estimated 6 (L) >60 mL/min    Comment: (NOTE) Calculated using the CKD-EPI Creatinine Equation (2021)    Anion gap 19 (H) 5 - 15    Comment: Performed at [facility], [address], [place], [place] [address]  CBG monitoring, ED     Status: Abnormal   Collection Time: 02/11/24  2:34 PM  Result Value Ref Range   Glucose-Capillary 564 (HH) 70 - 99 mg/dL    Comment: Glucose reference range applies only to samples taken after fasting for at least 8 hours.   Comment 1 Notify RN   Blood gas, venous     Status: Abnormal   Collection Time: 02/11/24  3:06 PM  Result Value Ref Range   pH, Ven 7.41 7.25 - 7.43   pCO2, Ven 52 44 - 60 mmHg   pO2, Ven <31 (LL) 32 - 45 mmHg    Comment: CRITICAL RESULT CALLED TO, READ BACK BY AND VERIFIED WITH: LONG,L ON 02/11/24 AT 1528 BY LOY,C    Bicarbonate 33.0 (H) 20.0 - 28.0 mmol/L   Acid-Base Excess 6.8 (H) 0.0 - 2.0 mmol/L   O2 Saturation 32.3 %   Patient temperature 37.2    Collection site RIGHT ANTECUBITAL    Drawn by [id number]     Comment: Performed at [facility], [address], [place], [place] [address]  POC CBG, ED     Status: Abnormal   Collection Time: 02/11/24  4:06 PM  Result Value Ref Range   Glucose-Capillary 530 (  HH) 70 - 99 mg/dL    Comment: Glucose reference range applies only to samples taken after fasting for at least 8 hours.   Comment 1 Notify RN   CBG monitoring, ED     Status:  Abnormal   Collection Time: 02/11/24  5:23 PM  Result Value Ref Range   Glucose-Capillary 512 (HH) 70 - 99 mg/dL    Comment: Glucose reference range applies only to samples taken after fasting for at least 8 hours.   No results found.  Pending Labs Unresulted Labs (From admission, onward)     Start     Ordered   02/12/24 0500  Basic metabolic panel  Tomorrow morning,   R        02/11/24 1818   02/12/24 0500  CBC  Tomorrow morning,   R        02/11/24 1818   02/11/24 1820  Hemoglobin A1c  Add-on,   AD       Comments: To assess prior glycemic control    02/11/24 1819   02/11/24 1811  Urinalysis, Routine w reflex microscopic -Urine, Catheterized  Once,   URGENT       Question:  Specimen Source  Answer:  Urine, Catheterized   02/11/24 1810   02/11/24 1619  Aerobic Culture w Gram Stain (superficial specimen)  Once,   URGENT       Question:  Patient immune status  Answer:  Immunocompromised   02/11/24 1620            Vitals/Pain Today's Vitals   02/11/24 1500 02/11/24 1600 02/11/24 1700 02/11/24 1745  BP: 107/62 114/63 111/66 (!) 113/54  Pulse: 90   80  Resp: 20 13 18 13   Temp:      TempSrc:      SpO2: 94%   97%  Weight:      Height:      PainSc:        Isolation Precautions No active isolations  Medications Medications  midodrine (PROAMATINE) tablet 10 mg (has no administration in time range)  aspirin EC tablet 81 mg (has no administration in time range)  atorvastatin (LIPITOR) tablet 40 mg (has no administration in time range)  cinacalcet (SENSIPAR) tablet 60 mg (has no administration in time range)  sevelamer carbonate (RENVELA) tablet 2,400-5,600 mg (has no administration in time range)  heparin injection 5,000 Units (has no administration in time range)  acetaminophen (TYLENOL) tablet 650 mg (has no administration in time range)    Or  acetaminophen (TYLENOL) suppository 650 mg (has no administration in time range)  polyethylene glycol (MIRALAX / GLYCOLAX)  packet 17 g (has no administration in time range)  bisacodyl (DULCOLAX) suppository 10 mg (has no administration in time range)  albuterol (PROVENTIL) (2.5 MG/3ML) 0.083% nebulizer solution 2.5 mg (has no administration in time range)  insulin aspart protamine- aspart (NOVOLOG MIX 70/30) injection 20 Units (has no administration in time range)  insulin aspart (novoLOG) injection 0-15 Units (has no administration in time range)  insulin aspart (novoLOG) injection 0-5 Units (has no administration in time range)  insulin aspart (novoLOG) injection 5 Units (has no administration in time range)  pantoprazole (PROTONIX) EC tablet 40 mg (has no administration in time range)  cefTRIAXone (ROCEPHIN) 1 g in sodium chloride 0.9 % 100 mL IVPB (has no administration in time range)  midodrine (PROAMATINE) tablet 10 mg (10 mg Oral Given 02/11/24 1436)  sodium chloride 0.9 % bolus 500 mL (0 mLs Intravenous Stopped 02/11/24 1819)  insulin aspart (novoLOG) injection 10 Units (10 Units Subcutaneous Given 02/11/24 1624)    Mobility walks with device     Focused Assessments Pt has right BKA.  Prosthesis at bedside.    R Recommendations: See Admitting Provider Note  Report given to:   Additional Notes:  l

## 2024-02-11 NOTE — ED Notes (Signed)
Pt reminded we need a urine sample.  

## 2024-02-11 NOTE — ED Triage Notes (Signed)
 [facility] c/o blood from penis x 6 days. [facility] stated pt CBG 591 an DTaP was supposed to take insulin but did not. Pt had dialysis yesterday full tx per pt. Pt noted to be hypotensive upon arrival.

## 2024-02-11 NOTE — H&P (Signed)
 TR[facility] H&P   Patient Demographics:    [name] [name], is a 63 y.o. male  MRN: [id number]   DOB - 12/27/1960  Admit Date - 02/11/2024  Outpatient Primary MD for the patient is Pcp, No  Referring MD/NP/PA: PA Upstill  Patient coming from: home  Chief Complaint  Patient presents with   Hyperglycemia   Hematuria      HPI:    [name] [name]  is a 63 y.o. male,  with medical history significant of ESRD-HD (MWF), DM2, HTN, nonischemic cardiomyopathy, systolic CHF, prior right BKA with known history of orthostasis,. -Patient recently secondary to complaints of dysuria, increased urinary frequency and urgency with dysuria, reports symptom has been ongoing for last 7 days, he saw his urgent care yesterday, where he was prescribed Cipro, and reported to provide urine sample for available from home, patient reports last dysuria was this morning, he still makes small amount of urine daily, has any fever, chills, he was prescribed ciprofloxacin by urgent care yesterday. -In ED his workup was significant for hypotension, apparently he has not been taking his midodrine as instructed, this has resolved with fluid bolus and resuming midodrine, as well blood glucose was significantly elevated at 530, he received subcu insulin, In-N-Out was nonyielding with no urine could be obtained, [facility] hospitalist consulted to admit.   Review of systems:   A full 10 point Review of Systems was done, except as stated above, all other Review of Systems were negative.   With Past History of the following :    Past Medical History:  Diagnosis Date   ESRD on hemodialysis (HCC)    a. HD MWF > 10 years.   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 01/2022 Echo: EF 45-50%, glob HK, mod LVH, mildly reduced RV fxn, mild MR, mod-sev MV Ca2+. Mild AS; b. 10/2023 Echo: EF 45-50%, glob HK, GrII DD, mildly reduced  RV fxn, RVSP 52.90mmHg. Mild MR/MS, mod TR.   Hypertension    Nonischemic cardiomyopathy (HCC)    a. 2019 Cath [facility] Nonobstructive disease; b. 12/2019 Echo: EF 50%; c. 12/2019 MV (Tx w/u [facility]): Ant infarct w/ peri-infarct ischemia. EF 35%; c. 05/2021 MV (Tx w/u [facility]): Cor Ca2+, EF 53%, motion artifact, equiv study, likely low risk.   Nonobstructive coronary artery disease    a.  2019 Cath ([place]): Left main normal, LAD 30 proximal, RCA 30 distal, EF 25%.   Obesity    Type 2 diabetes mellitus (HCC)    a. 10/2023 - admission w/ HHS.      Past Surgical History:  Procedure Laterality Date   A/V FISTULAGRAM N/A 08/15/2019   Procedure: A/V FISTULAGRAM;  Surgeon: Maeola Harman, MD;  Location: [facility] INVASIVE CV LAB;  Service: Cardiovascular;  Laterality: N/A;   A/V FISTULAGRAM N/A 01/21/2024   Procedure: A/V Fistulagram;  Surgeon: Ethelene Hal,  MD;  Location: [facility] INVASIVE CV LAB;  Service: Cardiovascular;  Laterality: N/A;   AMPUTATION Right 02/02/2022   Procedure: AMPUTATION 5th RAY;  Surgeon: Louann Sjogren, MD;  Location: [facility] OR;  Service: Podiatry;  Laterality: Right;   AMPUTATION Right 05/23/2022   Procedure: RIGHT BELOW KNEE AMPUTATION;  Surgeon: Nadara Mustard, MD;  Location: [facility] OR;  Service: Orthopedics;  Laterality: Right;   AMPUTATION Right 07/04/2022   Procedure: REVISION AMPUTATION BELOW KNEE;  Surgeon: Nadara Mustard, MD;  Location: [facility] OR;  Service: Orthopedics;  Laterality: Right;   APPLICATION OF WOUND VAC  05/23/2022   Procedure: APPLICATION OF WOUND VAC;  Surgeon: Nadara Mustard, MD;  Location: [facility] OR;  Service: Orthopedics;;   AV FISTULA PLACEMENT     CARDIAC CATHETERIZATION  02/17/2018   LHC 02/17/18 ([name]): 30% pLAD, 30% dRCA, LVEDP 25-30, LVEF 25%.     CATARACT EXTRACTION W/PHACO Right 03/29/2020   Procedure: CATARACT EXTRACTION PHACO AND INTRAOCULAR LENS PLACEMENT (IOC)  RIGHT DIABETIC;  Surgeon: Lockie Mola, MD;  Location: [facility] ORS;   Service: Ophthalmology;  Laterality: Right;  Lot # [id number] H [place]: 02:30.9 AP% 9.5% CDE: 14.38   EYE SURGERY Right    cataract removed   [name] N/A 12/02/2023   Procedure: [name];  Surgeon: Imogene Burn, MD;  Location: [facility] ENDOSCOPY;  Service: Gastroenterology;  Laterality: N/A;   I & D EXTREMITY Right 02/02/2022   Procedure: IRRIGATION AND DEBRIDEMENT RIGHT FOOT;  Surgeon: Louann Sjogren, MD;  Location: [facility] OR;  Service: Podiatry;  Laterality: Right;   INCISION AND DRAINAGE OF WOUND Right 02/08/2022   Procedure: IRRIGATION AND DEBRIDEMENT WOUND;  Surgeon: Asencion Islam, DPM;  Location: [facility] OR;  Service: Podiatry;  Laterality: Right;  with removal of infected bone    INSERTION OF DIALYSIS CATHETER  08/13/2019   Procedure: Insertion Of Dialysis Catheter;  Surgeon: Larina Earthly, MD;  Location: [facility] OR;  Service: Vascular;;   INSERTION OF DIALYSIS CATHETER Left 07/19/2020   Procedure: INSERTION OF DIALYSIS CATHETER;  Surgeon: Maeola Harman, MD;  Location: [facility] OR;  Service: Vascular;  Laterality: Left;   IR LUMBAR PUNCTURE  11/20/2023   IR US GUIDE BX ASP/DRAIN  11/07/2022   IRRIGATION AND DEBRIDEMENT ABSCESS     PERIPHERAL VASCULAR BALLOON ANGIOPLASTY  08/15/2019   Procedure: PERIPHERAL VASCULAR BALLOON ANGIOPLASTY;  Surgeon: Maeola Harman, MD;  Location: [facility] INVASIVE CV LAB;  Service: Cardiovascular;;   PERIPHERAL VASCULAR BALLOON ANGIOPLASTY Left 02/06/2022   Procedure: PERIPHERAL VASCULAR BALLOON ANGIOPLASTY;  Surgeon: Cephus Shelling, MD;  [facility]: [facility] INVASIVE CV LAB;  Service: Cardiovascular;  Laterality: Left;   PERIPHERAL VASCULAR BALLOON ANGIOPLASTY  01/21/2024   Procedure: PERIPHERAL VASCULAR BALLOON ANGIOPLASTY;  Surgeon: Ethelene Hal, MD;  Location: [facility] INVASIVE CV LAB;  Service: Cardiovascular;;  Cephalic Arch   RADIOLOGY WITH ANESTHESIA N/A 11/20/2023   Procedure: MRI WITH ANESTHESIA;  Surgeon: Radiologist, Medication, MD;  Location: [facility] OR;   Service: Radiology;  Laterality: N/A;   RADIOLOGY WITH ANESTHESIA N/A 11/20/2023   Procedure: IR WITH ANESTHESIA;  Surgeon: Gilmer Mor, DO;  Location: [facility] OR;  Service: Anesthesiology;  Laterality: N/A;  PLEASE OBTAIN OPENING PRESSURE AND DRAIN AT LEAST 30CC'S OF CSF.   REVISON OF ARTERIOVENOUS FISTULA Left 07/19/2020   Procedure: REVISON/PLICATION OF ARTERIOVENOUS FISTULA LEFT;  Surgeon: Maeola Harman, MD;  Location: [facility] OR;  Service: Vascular;  Laterality: Left;   THROMBECTOMY AND REVISION OF ARTERIOVENTOUS (AV) GORETEX  GRAFT Left 08/13/2019  Procedure: THROMBECTOMY OF LEFT ARM ARTERIOVENTOUS (AV) FISTULA;  Surgeon: Larina Earthly, MD;  Location: [facility] OR;  Service: Vascular;  Laterality: Left;   TOE AMPUTATION        Social History:     Social History   Tobacco Use   Smoking status: Never   Smokeless tobacco: Never  Substance Use Topics   Alcohol use: Yes        Family History :     Family History  Problem Relation Age of Onset   Hypertension Mother       Home Medications:   Prior to Admission medications   Medication Sig Start Date End Date Taking? Authorizing Provider  acetaminophen (TYLENOL) 500 MG tablet Take 1,000 mg by mouth 2 (two) times daily as needed for moderate pain (pain score 4-6), headache or fever.    [provider]  aspirin EC 81 MG tablet Take 1 tablet (81 mg total) by mouth daily. Swallow whole. 01/13/24   Osvaldo Shipper, MD  atorvastatin (LIPITOR) 40 MG tablet Take 1 tablet (40 mg total) by mouth daily. 01/13/24 02/12/24  Osvaldo Shipper, MD  B Complex-C-Folic Acid (DIALYVITE 800) 0.8 MG TABS Take 1 tablet by mouth daily.    [provider]  benzonatate (TESSALON) 200 MG capsule Take 200 mg by mouth at bedtime. 01/11/24   [provider]  cinacalcet (SENSIPAR) 60 MG tablet Take 60 mg by mouth daily. 02/03/24   [provider]  ciprofloxacin (CIPRO) 500 MG tablet Take 1 tablet (500 mg total) by mouth 2 (two)  times daily. 02/10/24   Particia Nearing, PA-C  insulin isophane & regular human KwikPen (NOVOLIN 70/30 KWIKPEN) (70-30) 100 UNIT/ML KwikPen Inject 30U in the morning and 25U in the evening 10/27/23   Briant Cedar, MD  Insulin Pen Needle 32G X 4 MM MISC Use with insulin pen. 10/28/23   Briant Cedar, MD  lidocaine-prilocaine (EMLA) cream Apply 1 Application topically every Monday, Wednesday, and Friday with hemodialysis.    [provider]  midodrine (PROAMATINE) 5 MG tablet Take 2 tablets (10 mg total) by mouth 3 (three) times daily with meals. 01/13/24 04/12/24  Osvaldo Shipper, MD  pantoprazole (PROTONIX) 40 MG tablet Take 1 tablet (40 mg total) by mouth 2 (two) times daily. 01/13/24 04/12/24  Osvaldo Shipper, MD  sevelamer carbonate (RENVELA) 800 MG tablet Take 2,400-5,600 mg by mouth See admin instructions. Take 7 tablets (4000mg ) three times daily with meals and 3 tablets (2400mg ) twice daily with snacks.    [provider]     Allergies:     Allergies  Allergen Reactions   Sulfa Antibiotics Diarrhea and Nausea And Vomiting     Physical Exam:   Vitals  Blood pressure (!) 113/54, pulse 80, temperature 98.9 F (37.2 C), temperature source Oral, resp. rate 13, height 6\' 1"  (1.854 m), weight 105.7 kg, SpO2 97%.   1. General Frail, deconditioned male, laying in bed in no apparent distress  2. Normal affect and insight, Not Suicidal or Homicidal, Awake Alert, Oriented X 3.  3. No F.N deficits, ALL C.Nerves Intact, Strength 5/5 all 4 extremities, Sensation intact all 4 extremities, Plantars down going.  4. Ears and Eyes appear Normal, Conjunctivae clear, PERRLA. Moist Oral Mucosa.  5. Supple Neck, No JVD, No cervical lymphadenopathy appriciated, No Carotid Bruits.  6. Symmetrical Chest wall movement, Good air movement bilaterally, CTAB.  7. RRR, No Gallops, Rubs or Murmurs, No Parasternal Heave.  8. Positive Bowel Sounds, Abdomen  Soft, No  tenderness, No organomegaly appriciated,No rebound -guarding or rigidity.  9.  No Cyanosis, Normal Skin Turgor, No Skin Rash or Bruise.  10. Good muscle tone,  joints appear normal , no effusions, Normal ROM.  Right BKA, with shrinker, prosthesis at bedside  Exam showing penis with dried blood at glans penis area, was able to express very small amount of yellow material.   Data Review:    CBC Recent Labs  Lab 02/11/24 1427  WBC 13.6*  HGB 13.7  HCT 43.0  PLT 252  MCV 87.0  MCH 27.7  MCHC 31.9  RDW 16.6*   ------------------------------------------------------------------------------------------------------------------  Chemistries  Recent Labs  Lab 02/11/24 1427  NA 125*  K 4.7  CL 80*  CO2 26  GLUCOSE 530*  BUN 34*  CREATININE 8.70*  CALCIUM 8.9  AST 17  ALT 18  ALKPHOS 137*  BILITOT 1.1   ------------------------------------------------------------------------------------------------------------------ estimated creatinine clearance is 11.4 mL/min (A) (by C-G formula based on SCr of 8.7 mg/dL (H)). ------------------------------------------------------------------------------------------------------------------ No results for input(s): "TSH", "T4TOTAL", "T3FREE", "THYROIDAB" in the last 72 hours.  Invalid input(s): "FREET3"  Coagulation profile No results for input(s): "INR", "PROTIME" in the last 168 hours. ------------------------------------------------------------------------------------------------------------------- No results for input(s): "DDIMER" in the last 72 hours. -------------------------------------------------------------------------------------------------------------------  Cardiac Enzymes No results for input(s): "CKMB", "TROPONINI", "MYOGLOBIN" in the last 168 hours.  Invalid input(s): "CK" ------------------------------------------------------------------------------------------------------------------    Component Value Date/Time    BNP 1,106.3 (H) 01/12/2024 1250     ---------------------------------------------------------------------------------------------------------------  Urinalysis    Component Value Date/Time   COLORURINE AMBER (A) 07/01/2022 1143   APPEARANCEUR CLEAR 07/01/2022 1143   LABSPEC 1.017 07/01/2022 1143   PHURINE 7.0 07/01/2022 1143   GLUCOSEU NEGATIVE 07/01/2022 1143   HGBUR NEGATIVE 07/01/2022 1143   BILIRUBINUR NEGATIVE 07/01/2022 1143   KETONESUR NEGATIVE 07/01/2022 1143   PROTEINUR >=300 (A) 07/01/2022 1143   NITRITE NEGATIVE 07/01/2022 1143   LEUKOCYTESUR NEGATIVE 07/01/2022 1143    ----------------------------------------------------------------------------------------------------------------   Imaging Results:    No results found.     Assessment & Plan:    Principal Problem:   Uncontrolled diabetes mellitus with hyperglycemia (HCC) Active Problems:   ESRD on dialysis (HCC)   Hypotension   HTN (hypertension)   CAD (coronary artery disease)   DM2 (diabetes mellitus, type 2) (HCC)   Below-knee amputation of right lower extremity (HCC)   Obesity (BMI 30-39.9)     Orthostasis -Patient with known history of orthostatic hypotension, with recent admissions in the past for the same for not taking his midodrine -He is hypotensive on presentation as well, patient reports he is not taking his midodrine anymore , have discussed with him importance of medication compliance  -Blood pressure much improved with small fluid bolus and resuming his midodrine.  Diabetes mellitus, type II, insulin-dependent poorly controlled with hyperkalemia -This is secondary to noncompliance, blood glucose was 530, patient reports he did not take his insulin yesterday -will give 5 units of IV insulin, resume on insulin 70/30 20 units twice daily, and will add insulin sliding scale as well -Recheck A1c, most recent A1c> 15.22 October 2023  Dysuria, dysuria, penile pain -Reports he is still  making urine, small amount, was started on p.o. Cipro at urgent care clinic yesterday, will try to obtain in and out UA prior to initiating IV Rocephin, but even if cultures are negative, with antibiotic given he was on antibiotics from yesterday. -patient reports he is not sexually active.   ESRD on HD -Patient on Monday  Wednesday Friday schedule, he received his HD yesterday, discussed with renal, continue with his HD during hospital stay   diabetes mellitus type 2 with long-term use of insulin Continue home medication regimen   Right BKA -His prosthesis is at bedside, will consult PT, OT   GERD  History of GIB -Continue Protonix  Hyperlipidemia -Continue with statin   History of CAD -Continue with aspirin and statin   Obesity Body mass index is 30.74 kg/m.  Medication noncompliance -he was counseled, it does appear his main issue is medication noncompliance which is contributing both to his hyperglycemia and orthostasis    DVT Prophylaxis Heparin   AM Labs Ordered, also please review Full Orders  Family Communication: Admission, patients condition and plan of care including tests being ordered have been discussed with the patient and* who indicate understanding and agree with the plan and Code Status.  Code Status full code  Likely DC to home  Consults called: Discussed with renal, will dialyze tomorrow  Admission status: Inpatient  Time spent in minutes : 70 minutes   Huey Bienenstock M.D on 02/11/2024 at 6:30 PM   [facility] - Office  [phone]

## 2024-02-11 NOTE — TOC Initial Note (Signed)
 Transition of Care (TOC) - Initial/Assessment Note    Patient Details  Name: [name] NEWSHAM Sr. MRN: [id number] Date of Birth: 1960/12/13  [facility] CM/SW Contact:    Barron Alvine, RN Phone Number: 02/11/2024, 9:16 PM  Clinical Narrative:                 Per chart, 63 y/o male, with history of ESRD-HD (MWF), DM2, HTN, nonischemic cardiomyopathy, systolic CHF, prior right BKA with known history of orthostasis,.C/o increased urinary frequency and urgency with dysuria and blood from penis for about a week. Was seen at urgent care yesterday and prescribed antibiotic. Admitted today c/uncontrolled DM c/hyperglycemia. TOC to follow.   Expected Discharge Plan: Home/Self Care Barriers to Discharge: Continued Medical Work up   Patient Goals and CMS Choice Patient states their goals for this hospitalization and ongoing recovery are:: Return to baseline.          Expected Discharge Plan and Services In-house Referral: Clinical Social Work Discharge Planning Services: CM Consult                                          Prior Living Arrangements/Services   Lives with:: Relatives Patient language and need for interpreter reviewed:: Yes Do you feel safe going back to the place where you live?: Yes      Need for Family Participation in Patient Care: Yes (Comment) Care giver support system in place?: Yes (comment)   Criminal Activity/Legal Involvement Pertinent to Current Situation/Hospitalization: No - Comment as needed  Activities of Daily Living      Permission Sought/Granted                  Emotional Assessment       Orientation: : Oriented to Self, Oriented to Place, Oriented to Situation   Psych Involvement: No (comment)  Admission diagnosis:  Uncontrolled diabetes mellitus with hyperglycemia (HCC) [E11.65] Hypotension [I95.9] Patient Active Problem List   Diagnosis Date Noted   Uncontrolled diabetes mellitus with hyperglycemia (HCC)  02/11/2024   Orthostatic hypotension 01/12/2024   Iron deficiency anemia 12/01/2023   Ischemic colitis (HCC) 11/27/2023   Bloody diarrhea 11/27/2023   Heme positive stool 11/25/2023   Acute gastroenteropathy due to Norovirus 11/25/2023   Aortic valve stenosis 11/15/2023   Atrial flutter with rapid ventricular response (HCC) 11/10/2023   NSTEMI (non-ST elevated myocardial infarction) (HCC) 11/08/2023   Hyperglycemia 10/24/2023   Abnormal EKG 10/24/2023   RUQ abdominal pain 10/30/2022   Cholelithiasis 10/29/2022   Hyperbilirubinemia 10/29/2022   Volume overload 10/19/2022   CHF (congestive heart failure) (HCC) 10/18/2022   Hyperosmolar hyperglycemic state (HHS) (HCC)    Diarrhea    Dehiscence of amputation stump of right lower extremity (HCC)    Abscess of right lower leg    Pressure injury of skin 07/02/2022   Cellulitis of right leg 07/01/2022   CAD (coronary artery disease) 05/24/2022   Leukocytosis 05/24/2022   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/23/2022   Obesity (BMI 30-39.9) 02/19/2022   Hypotension 02/08/2022   History of CVA  02/05/2022   Carotid stenosis 02/05/2022   Hyperkalemia 02/03/2022   acute on chronic Anemia of chronic renal failure  02/03/2022   Subacute osteomyelitis, right ankle and foot (HCC) 02/02/2022   Acute metabolic encephalopathy 02/02/2022   Sepsis with encephalopathy and septic shock (HCC) 02/02/2022   Protein-calorie  malnutrition, mild (HCC) 02/02/2022   GERD (gastroesophageal reflux disease) 02/02/2022   Acute on chronic systolic CHF (congestive heart failure) (HCC) 08/14/2019   LBBB (left bundle branch block) 08/13/2019   Problem with vascular access 08/13/2019   Dialysis AV fistula malfunction (HCC) 08/12/2019   ESRD on dialysis (HCC) 09/20/2018   DM2 (diabetes mellitus, type 2) (HCC) 09/20/2018   HTN (hypertension) 09/20/2018   PCP:  Pcp, No Pharmacy:   [facility] - [place], [place] - [address] [place] [address] Phone: [phone] Fax: [phone]  [facility] [place], [place] - [address] [place] [address] Phone: [phone] Fax: [phone]  [facility] [address] [place] [address] Phone: [phone] Fax: [phone]     Social Drivers of Health (SDOH) Social History: SDOH Screenings   Food Insecurity: No Food Insecurity (01/12/2024)  Housing: Low Risk  (01/12/2024)  Transportation Needs: No Transportation Needs (01/12/2024)  Utilities: Not At Risk (01/12/2024)  Alcohol Screen: Low Risk  (02/04/2022)  Depression (PHQ2-9): Low Risk  (08/06/2022)  Tobacco Use: Low Risk  (02/10/2024)   SDOH Interventions:     Readmission Risk Interventions    02/11/2024    9:09 PM 11/05/2022    3:08 PM  Readmission Risk Prevention Plan  Transportation Screening Complete Complete  Medication Review (RN Care Manager) Complete Complete  PCP or Specialist appointment within 3-5 days of discharge Complete Complete  HRI or Home Care Consult Complete Complete  SW Recovery Care/Counseling Consult  Complete  Palliative Care Screening Not Applicable Complete  Skilled Nursing Facility Complete Complete

## 2024-02-11 NOTE — ED Notes (Signed)
 IN and out cath attempted. No urine return.  Dark brown purulent discharge clogging catheter tube.  Admitting provider notified.

## 2024-02-12 DIAGNOSIS — E1165 Type 2 diabetes mellitus with hyperglycemia: Secondary | ICD-10-CM | POA: Diagnosis not present

## 2024-02-12 LAB — BASIC METABOLIC PANEL WITH GFR
Anion gap: 16 — ABNORMAL HIGH (ref 5–15)
BUN: 44 mg/dL — ABNORMAL HIGH (ref 8–23)
CO2: 24 mmol/L (ref 22–32)
Calcium: 9.1 mg/dL (ref 8.9–10.3)
Chloride: 88 mmol/L — ABNORMAL LOW (ref 98–111)
Creatinine, Ser: 9.6 mg/dL — ABNORMAL HIGH (ref 0.61–1.24)
GFR, Estimated: 6 mL/min — ABNORMAL LOW (ref >60)
Glucose, Bld: 277 mg/dL — ABNORMAL HIGH (ref 70–99)
Potassium: 4.5 mmol/L (ref 3.5–5.1)
Sodium: 128 mmol/L — ABNORMAL LOW (ref 135–145)

## 2024-02-12 LAB — GLUCOSE, CAPILLARY
Glucose-Capillary: 141 mg/dL — ABNORMAL HIGH (ref 70–99)
Glucose-Capillary: 162 mg/dL — ABNORMAL HIGH (ref 70–99)
Glucose-Capillary: 174 mg/dL — ABNORMAL HIGH (ref 70–99)
Glucose-Capillary: 184 mg/dL — ABNORMAL HIGH (ref 70–99)
Glucose-Capillary: 239 mg/dL — ABNORMAL HIGH (ref 70–99)
Glucose-Capillary: 260 mg/dL — ABNORMAL HIGH (ref 70–99)
Glucose-Capillary: 271 mg/dL — ABNORMAL HIGH (ref 70–99)
Glucose-Capillary: 271 mg/dL — ABNORMAL HIGH (ref 70–99)
Glucose-Capillary: 321 mg/dL — ABNORMAL HIGH (ref 70–99)

## 2024-02-12 LAB — CBC
HCT: 40.5 % (ref 39.0–52.0)
Hemoglobin: 13 g/dL (ref 13.0–17.0)
MCH: 27.7 pg (ref 26.0–34.0)
MCHC: 32.1 g/dL (ref 30.0–36.0)
MCV: 86.2 fL (ref 80.0–100.0)
Platelets: 249 10*3/uL (ref 150–400)
RBC: 4.7 MIL/uL (ref 4.22–5.81)
RDW: 16.4 % — ABNORMAL HIGH (ref 11.5–15.5)
WBC: 13 10*3/uL — ABNORMAL HIGH (ref 4.0–10.5)
nRBC: 0 % (ref 0.0–0.2)

## 2024-02-12 LAB — LACTIC ACID, PLASMA: Lactic Acid, Venous: 1.7 mmol/L (ref 0.5–1.9)

## 2024-02-12 LAB — GC/CHLAMYDIA PROBE AMP (~~LOC~~) NOT AT ARMC
Chlamydia: NEGATIVE
Comment: NEGATIVE
Comment: NORMAL
Neisseria Gonorrhea: NEGATIVE

## 2024-02-12 LAB — PROTIME-INR
INR: 1.3 — ABNORMAL HIGH (ref 0.8–1.2)
Prothrombin Time: 16.2 s — ABNORMAL HIGH (ref 11.4–15.2)

## 2024-02-12 LAB — MRSA NEXT GEN BY PCR, NASAL: MRSA by PCR Next Gen: DETECTED — AB

## 2024-02-12 LAB — HEPATITIS B SURFACE ANTIGEN: Hepatitis B Surface Ag: NONREACTIVE

## 2024-02-12 LAB — APTT: aPTT: 28 s (ref 24–36)

## 2024-02-12 MED ORDER — MIDODRINE HCL 5 MG PO TABS
10.0000 mg | ORAL_TABLET | Freq: Once | ORAL | Status: AC
  Administered 2024-02-12: 10 mg via ORAL
  Filled 2024-02-12: qty 2

## 2024-02-12 MED ORDER — SEVELAMER CARBONATE 800 MG PO TABS: 2400.0000 mg | ORAL_TABLET | ORAL | Status: DC | PRN

## 2024-02-12 MED ORDER — MIDODRINE HCL 5 MG PO TABS
15.0000 mg | ORAL_TABLET | Freq: Three times a day (TID) | ORAL | Status: DC
  Administered 2024-02-12 – 2024-02-17 (×14): 15 mg via ORAL
  Filled 2024-02-12 (×13): qty 3

## 2024-02-12 MED ORDER — ALBUMIN HUMAN 25 % IV SOLN
25.0000 g | Freq: Once | INTRAVENOUS | Status: AC
  Administered 2024-02-12: 25 g via INTRAVENOUS
  Filled 2024-02-12: qty 100

## 2024-02-12 MED ORDER — INSULIN GLARGINE-YFGN 100 UNIT/ML ~~LOC~~ SOLN
38.0000 [IU] | Freq: Every day | SUBCUTANEOUS | Status: DC
  Administered 2024-02-12 – 2024-02-17 (×6): 38 [IU] via SUBCUTANEOUS
  Filled 2024-02-12 (×7): qty 0.38

## 2024-02-12 MED ORDER — METRONIDAZOLE 500 MG/100ML IV SOLN
500.0000 mg | Freq: Two times a day (BID) | INTRAVENOUS | Status: DC
  Administered 2024-02-12: 500 mg via INTRAVENOUS
  Filled 2024-02-12: qty 100

## 2024-02-12 MED ORDER — VANCOMYCIN HCL IN DEXTROSE 1-5 GM/200ML-% IV SOLN
1000.0000 mg | INTRAVENOUS | Status: DC
  Administered 2024-02-13: 1000 mg via INTRAVENOUS
  Filled 2024-02-12: qty 200

## 2024-02-12 MED ORDER — SODIUM CHLORIDE 0.9 % IV SOLN
2.0000 g | Freq: Once | INTRAVENOUS | Status: AC
  Administered 2024-02-12: 2 g via INTRAVENOUS
  Filled 2024-02-12: qty 12.5

## 2024-02-12 MED ORDER — INSULIN ASPART 100 UNIT/ML IJ SOLN
5.0000 [IU] | Freq: Three times a day (TID) | INTRAMUSCULAR | Status: DC
  Administered 2024-02-12 – 2024-02-16 (×8): 5 [IU] via SUBCUTANEOUS

## 2024-02-12 MED ORDER — VANCOMYCIN HCL 2000 MG/400ML IV SOLN
2000.0000 mg | Freq: Once | INTRAVENOUS | Status: AC
  Administered 2024-02-12: 2000 mg via INTRAVENOUS
  Filled 2024-02-12: qty 400

## 2024-02-12 MED ORDER — MUPIROCIN 2 % EX OINT
1.0000 | TOPICAL_OINTMENT | Freq: Two times a day (BID) | CUTANEOUS | Status: AC
  Administered 2024-02-12 – 2024-02-16 (×10): 1 via NASAL
  Filled 2024-02-12 (×2): qty 22

## 2024-02-12 MED ORDER — SEVELAMER CARBONATE 800 MG PO TABS
4000.0000 mg | ORAL_TABLET | Freq: Three times a day (TID) | ORAL | Status: DC
  Administered 2024-02-12 – 2024-02-17 (×12): 4000 mg via ORAL
  Filled 2024-02-12 (×13): qty 5

## 2024-02-12 MED ORDER — SODIUM CHLORIDE 0.9 % IV SOLN
1.0000 g | INTRAVENOUS | Status: DC
  Administered 2024-02-12 – 2024-02-17 (×6): 1 g via INTRAVENOUS
  Filled 2024-02-12 (×3): qty 10
  Filled 2024-02-12 (×3): qty 1

## 2024-02-12 NOTE — Progress Notes (Signed)
 OT Cancellation Note  Patient Details Name: [name] DREW Sr. MRN: [id number] DOB: [birth year]   Cancelled Treatment:    Reason Eval/Treat Not Completed: Medical issues which prohibited therapy (BP was low per RN, will check back later)  Bevelyn Ngo, OTR/L 02/12/2024, 9:38 AM

## 2024-02-12 NOTE — Plan of Care (Signed)
   Problem: Education: Goal: Knowledge of General Education information will improve Description: Including pain rating scale, medication(s)/side effects and non-pharmacologic comfort measures Outcome: Progressing   Problem: Coping: Goal: Level of anxiety will decrease Outcome: Progressing   Problem: Safety: Goal: Ability to remain free from injury will improve Outcome: Progressing

## 2024-02-12 NOTE — Consult Note (Addendum)
 [facility]  INPATIENT CONSULTATION  Reason for Consultation: ESRD Requesting Provider: Dr. Jarvis Newcomer  ZOX:WRUEA K Prew Sr. is a 63 y.o. male.with ESRD on hemodialysis MWF at [facility] PMH: HTN, DMT2, R BKA, nonobstructive CAD,HFrEF, NICM, obesity, anemia, SHPT currently admitted to [facility] hematuria and hyperglycemia and nephrology is consulted for comanagement of ESRD and assoc conditions.   Presented w 1 wk h/o dysuria, hematuria.  ED found to have hypotension for which he rec'd po midodrine (on outpt) and small fluid bolus.  His BG was 530.  Tm 100.4. He was admitted to stepdown and placed on vanc, flagyl and cefepime while cultures pending. Rec'd 1 dose of IV insulin and is now back on SQ.   He is receiving midodrine 10 TID.   This morning he is afebrile, BG in 200s.  BP 86/41, HR 70s.  Typical BPs at HD are in the 100-130 range.  When I saw him BP was 78/55 and albumin infusing.  He is feeling ok - denies f/c/dizziness/dyspnea/CP.  Says dysuria and penile pain resolved and no other new symptoms.  Denies abd pain.   PMH: Past Medical History:  Diagnosis Date   ESRD on hemodialysis (HCC)    a. HD MWF > 10 years.   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 01/2022 Echo: EF 45-50%, glob HK, mod LVH, mildly reduced RV fxn, mild MR, mod-sev MV Ca2+. Mild AS; b. 10/2023 Echo: EF 45-50%, glob HK, GrII DD, mildly reduced RV fxn, RVSP 52.38mmHg. Mild MR/MS, mod TR.   Hypertension    Nonischemic cardiomyopathy (HCC)    a. 2019 Cath [facility]): Nonobstructive disease; b. 12/2019 Echo: EF 50%; c. 12/2019 MV (Tx w/u [facility]): Ant infarct w/ peri-infarct ischemia. EF 35%; c. 05/2021 MV (Tx w/u [facility]): Cor Ca2+, EF 53%, motion artifact, equiv study, likely low risk.   Nonobstructive coronary artery disease    a.  2019 Cath ([place]): Left main normal, LAD 30 proximal, RCA 30 distal, EF 25%.   Obesity    Type 2 diabetes mellitus (HCC)    a. 10/2023 -  admission w/ [facility].   PSH: Past Surgical History:  Procedure Laterality Date   A/V FISTULAGRAM N/A 08/15/2019   Procedure: A/V FISTULAGRAM;  Surgeon: Maeola Harman, MD;  Location: [facility] INVASIVE CV LAB;  Service: Cardiovascular;  Laterality: N/A;   A/V FISTULAGRAM N/A 01/21/2024   Procedure: A/V Fistulagram;  Surgeon: Ethelene Hal, MD;  Location: [facility] INVASIVE CV LAB;  Service: Cardiovascular;  Laterality: N/A;   AMPUTATION Right 02/02/2022   Procedure: AMPUTATION 5th RAY;  Surgeon: Louann Sjogren, MD;  Location: [facility] OR;  Service: Podiatry;  Laterality: Right;   AMPUTATION Right 05/23/2022   Procedure: RIGHT BELOW KNEE AMPUTATION;  Surgeon: Nadara Mustard, MD;  Location: [facility] OR;  Service: Orthopedics;  Laterality: Right;   AMPUTATION Right 07/04/2022   Procedure: REVISION AMPUTATION BELOW KNEE;  Surgeon: Nadara Mustard, MD;  Location: [facility] OR;  Service: Orthopedics;  Laterality: Right;   APPLICATION OF WOUND VAC  05/23/2022   Procedure: APPLICATION OF WOUND VAC;  Surgeon: Nadara Mustard, MD;  Location: [facility] OR;  Service: Orthopedics;;   AV FISTULA PLACEMENT     CARDIAC CATHETERIZATION  02/17/2018   LHC 02/17/18 ([name]): 30% pLAD, 30% dRCA, LVEDP 25-30, LVEF 25%.     CATARACT EXTRACTION W/PHACO Right 03/29/2020   Procedure: CATARACT EXTRACTION PHACO AND INTRAOCULAR LENS PLACEMENT (IOC)  RIGHT DIABETIC;  Surgeon: Lockie Mola,  MD;  Location: [facility] ORS;  Service: Ophthalmology;  Laterality: Right;  Lot # [id number] H [place]: 02:30.9 AP% 9.5% CDE: 14.38   EYE SURGERY Right    cataract removed   [name] N/A 12/02/2023   Procedure: [name];  Surgeon: Imogene Burn, MD;  Location: [facility] ENDOSCOPY;  Service: Gastroenterology;  Laterality: N/A;   I & D EXTREMITY Right 02/02/2022   Procedure: IRRIGATION AND DEBRIDEMENT RIGHT FOOT;  Surgeon: Louann Sjogren, MD;  Location: [facility] OR;  Service: Podiatry;  Laterality: Right;   INCISION AND DRAINAGE OF WOUND Right 02/08/2022    Procedure: IRRIGATION AND DEBRIDEMENT WOUND;  Surgeon: Asencion Islam, DPM;  Location: [facility] OR;  Service: Podiatry;  Laterality: Right;  with removal of infected bone    INSERTION OF DIALYSIS CATHETER  08/13/2019   Procedure: Insertion Of Dialysis Catheter;  Surgeon: Larina Earthly, MD;  Location: [facility] OR;  Service: Vascular;;   INSERTION OF DIALYSIS CATHETER Left 07/19/2020   Procedure: INSERTION OF DIALYSIS CATHETER;  Surgeon: Maeola Harman, MD;  Location: [facility] OR;  Service: Vascular;  Laterality: Left;   IR LUMBAR PUNCTURE  11/20/2023   IR US GUIDE BX ASP/DRAIN  11/07/2022   IRRIGATION AND DEBRIDEMENT ABSCESS     PERIPHERAL VASCULAR BALLOON ANGIOPLASTY  08/15/2019   Procedure: PERIPHERAL VASCULAR BALLOON ANGIOPLASTY;  Surgeon: Maeola Harman, MD;  Location: [facility] INVASIVE CV LAB;  Service: Cardiovascular;;   PERIPHERAL VASCULAR BALLOON ANGIOPLASTY Left 02/06/2022   Procedure: PERIPHERAL VASCULAR BALLOON ANGIOPLASTY;  Surgeon: Cephus Shelling, MD;  [facility] INVAS[facility] CV LAB;  Service: Cardiovascular;  Laterality: Left;   PERIPHERAL VASCULAR BALLOON ANGIOPLASTY  01/21/2024   Procedure: PERIPHERAL VASCULAR BALLOON ANGIOPLASTY;  Surgeon: Ethelene Hal, MD;  [facility] INVASIVE CV LAB;  Service: Cardiovascular;;  Cephalic Arch   RADIOLOGY WITH ANESTHESIA N/A 11/20/2023   Procedure: MRI WITH ANESTHESIA;  Surgeon: Radiologist, Medication, MD;  Location: [facility] OR;  Service: Radiology;  Laterality: N/A;   RADIOLOGY WITH ANESTHESIA N/A 11/20/2023   Procedure: IR WITH ANESTHESIA;  Surgeon: Gilmer Mor, DO;  Location: [facility] OR;  Service: Anesthesiology;  Laterality: N/A;  PLEASE OBTAIN OPENING PRESSURE AND DRAIN AT LEAST 30CC'S OF CSF.   REVISON OF ARTERIOVENOUS FISTULA Left 07/19/2020   Procedure: REVISON/PLICATION OF ARTERIOVENOUS FISTULA LEFT;  Surgeon: Maeola Harman, MD;  Location: [facility] OR;  Service: Vascular;  Laterality: Left;   THROMBECTOMY AND REVISION OF ARTERIOVENTOUS (AV) GORETEX   GRAFT Left 08/13/2019   Procedure: THROMBECTOMY OF LEFT ARM ARTERIOVENTOUS (AV) FISTULA;  Surgeon: Larina Earthly, MD;  Location: [facility] OR;  Service: Vascular;  Laterality: Left;   TOE AMPUTATION       Past Medical History:  Diagnosis Date   ESRD on hemodialysis (HCC)    a. HD MWF > 10 years.   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 01/2022 Echo: EF 45-50%, glob HK, mod LVH, mildly reduced RV fxn, mild MR, mod-sev MV Ca2+. Mild AS; b. 10/2023 Echo: EF 45-50%, glob HK, GrII DD, mildly reduced RV fxn, RVSP 52.42mmHg. Mild MR/MS, mod TR.   Hypertension    Nonischemic cardiomyopathy (HCC)    a. 2019 Cath [facility]): Nonobstructive disease; b. 12/2019 Echo: EF 50%; c. 12/2019 MV (Tx w/u [facility]): Ant infarct w/ peri-infarct ischemia. EF 35%; c. 05/2021 MV (Tx w/u [facility]): Cor Ca2+, EF 53%, motion artifact, equiv study, likely low risk.   Nonobstructive coronary artery disease    a.  2019 Cath ([place]): Left main normal, LAD 30 proximal, RCA 30 distal, EF 25%.   Obesity    Type 2 diabetes mellitus (HCC)    a. 10/2023 - admission w/ [facility]HS.    Medications:  I have reviewed the patient's current medications.  Medications Prior to Admission  Medication Sig Dispense Refill   acetaminophen (TYLENOL) 500 MG tablet Take 1,000 mg by mouth 2 (two) times daily as needed for moderate pain (pain score 4-6), headache or fever.     aspirin EC 81 MG tablet Take 1 tablet (81 mg total) by mouth daily. Swallow whole. 30 tablet 2   insulin isophane & regular human KwikPen (NOVOLIN 70/30 KWIKPEN) (70-30) 100 UNIT/ML KwikPen Inject 30U in the morning and 25U in the evening 15 mL 3   atorvastatin (LIPITOR) 40 MG tablet Take 1 tablet (40 mg total) by mouth daily. 30 tablet 2   B Complex-C-Folic Acid (DIALYVITE 800) 0.8 MG TABS Take 1 tablet by mouth daily.     benzonatate (TESSALON) 200 MG capsule Take 200 mg by mouth at bedtime.     cinacalcet (SENSIPAR) 60 MG tablet Take 60 mg by mouth  daily.     ciprofloxacin (CIPRO) 500 MG tablet Take 1 tablet (500 mg total) by mouth 2 (two) times daily. 14 tablet 0   Insulin Pen Needle 32G X 4 MM MISC Use with insulin pen. 100 each 0   lidocaine-prilocaine (EMLA) cream Apply 1 Application topically every Monday, Wednesday, and Friday with hemodialysis.     midodrine (PROAMATINE) 5 MG tablet Take 2 tablets (10 mg total) by mouth 3 (three) times daily with meals. 180 tablet 2   pantoprazole (PROTONIX) 40 MG tablet Take 1 tablet (40 mg total) by mouth 2 (two) times daily. 60 tablet 2   sevelamer carbonate (RENVELA) 800 MG tablet Take 2,400-5,600 mg by mouth See admin instructions. Take 7 tablets (4000mg ) three times daily with meals and 3 tablets (2400mg ) twice daily with snacks.      ALLERGIES:   Allergies  Allergen Reactions   Sulfa Antibiotics Diarrhea and Nausea And Vomiting    FAM HX: Family History  Problem Relation Age of Onset   Hypertension Mother     Social History:   reports that he has never smoked. He has never used smokeless tobacco. He reports current alcohol use. He reports that he does not use drugs.  ROS: 12 system ROS neg except per HPI above  Blood pressure (!) 86/41, pulse 77, temperature 98.3 F (36.8 C), temperature source Oral, resp. rate (!) 28, height 6\' 1"  (1.854 m), weight 105.7 kg, SpO2 97%. PHYSICAL EXAM: General: Chronically ill appearing male in no acute distress. Head: Normocephalic, atraumatic, sclera non-icteric, mucus membranes are moist Neck: Supple.  Lungs: clear Heart: RRR, no rub.  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 no edema LLE Neuro:appearing non focal but not conversant, just answering questions with nod/shakes and keeping eyes closed Dialysis Access: L AVF + T/B, small scab distally - no oozing noted   Results for orders placed or performed during the hospital encounter of 02/11/24 (from the past 48 hours)  CBC     Status: Abnormal   Collection Time:  02/11/24  2:27 PM  Result Value Ref Range   WBC 13.6 (H) 4.0 - 10.5 K/uL   RBC 4.94 4.22 - 5.81 MIL/uL   Hemoglobin 13.7 13.0 - 17.0 g/dL   HCT 78.2 95.6 - 21.3 %   MCV 87.0 80.0 - 100.0 fL  MCH 27.7 26.0 - 34.0 pg   MCHC 31.9 30.0 - 36.0 g/dL   RDW 16.1 (H) 09.6 - 04.5 %   Platelets 252 150 - 400 K/uL   nRBC 0.0 0.0 - 0.2 %    Comment: Performed at [facility], [address], [place], [place] [address]  Comprehensive metabolic panel     Status: Abnormal   Collection Time: 02/11/24  2:27 PM  Result Value Ref Range   Sodium 125 (L) 135 - 145 mmol/L   Potassium 4.7 3.5 - 5.1 mmol/L   Chloride 80 (L) 98 - 111 mmol/L   CO2 26 22 - 32 mmol/L   Glucose, Bld 530 (HH) 70 - 99 mg/dL    Comment: CRITICAL RESULT CALLED TO, READ BACK BY AND VERIFIED WITH BERRIER,C ON 02/11/24 AT 1535 BY LOY,C Glucose reference range applies only to samples taken after fasting for at least 8 hours.    BUN 34 (H) 8 - 23 mg/dL   Creatinine, Ser 1.91 (H) 0.61 - 1.24 mg/dL   Calcium 8.9 8.9 - 47.8 mg/dL   Total Protein 9.4 (H) 6.5 - 8.1 g/dL   Albumin 3.4 (L) 3.5 - 5.0 g/dL   AST 17 15 - 41 U/L   ALT 18 0 - 44 U/L   Alkaline Phosphatase 137 (H) 38 - 126 U/L   Total Bilirubin 1.1 0.0 - 1.2 mg/dL   GFR, Estimated 6 (L) >60 mL/min    Comment: (NOTE) Calculated using the CKD-EPI Creatinine Equation (2021)    Anion gap 19 (H) 5 - 15    Comment: Performed at [facility], [address], [place], [place] [address]  Hemoglobin A1c     Status: Abnormal   Collection Time: 02/11/24  2:27 PM  Result Value Ref Range   Hgb A1c MFr Bld 8.4 (H) 4.8 - 5.6 %    Comment: (NOTE) Pre diabetes:          5.7%-6.4%  Diabetes:              >6.4%  Glycemic control for   <7.0% adults with diabetes    Mean Plasma Glucose 194.38 mg/dL    Comment: Performed at [facility], [address] [place], [place] [address]  Beta-hydroxybutyric acid     Status: None   Collection Time: 02/11/24  2:27 PM  Result Value Ref Range    Beta-Hydroxybutyric Acid 0.14 0.05 - 0.27 mmol/L    Comment: Performed at [facility], [address], [place], [place] [address]  Phosphorus     Status: Abnormal   Collection Time: 02/11/24  2:27 PM  Result Value Ref Range   Phosphorus 4.7 (H) 2.5 - 4.6 mg/dL    Comment: Performed at [facility], [address] [place], [place] [address]  CBG monitoring, ED     Status: Abnormal   Collection Time: 02/11/24  2:34 PM  Result Value Ref Range   Glucose-Capillary 564 (HH) 70 - 99 mg/dL    Comment: Glucose reference range applies only to samples taken after fasting for at least 8 hours.   Comment 1 Notify RN   Blood gas, venous     Status: Abnormal   Collection Time: 02/11/24  3:06 PM  Result Value Ref Range   pH, Ven 7.41 7.25 - 7.43   pCO2, Ven 52 44 - 60 mmHg   pO2, Ven <31 (LL) 32 - 45 mmHg    Comment: CRITICAL RESULT CALLED TO, READ BACK BY AND VERIFIED WITH:  LONG,L ON 02/11/24 AT 1528 BY LOY,C    Bicarbonate 33.0 (H) 20.0 - 28.0 mmol/L   Acid-Base Excess 6.8 (H) 0.0 - 2.0 mmol/L   O2 Saturation 32.3 %   Patient temperature 37.2    Collection site RIGHT ANTECUBITAL    Drawn by [id number]     Comment: Performed at [facility], [address] [place], [place] [address]  POC CBG, ED     Status: Abnormal   Collection Time: 02/11/24  4:06 PM  Result Value Ref Range   Glucose-Capillary 530 (HH) 70 - 99 mg/dL    Comment: Glucose reference range applies only to samples taken after fasting for at least 8 hours.   Comment 1 Notify RN   Aerobic Culture w Gram Stain (superficial specimen)     Status: None (Preliminary result)   Collection Time: 02/11/24  4:27 PM   Specimen: Penis  Result Value Ref Range   Specimen Description      PENIS Performed at [facility], [address], [place], [place] [address]    Special Requests      Immunocompromised Performed at [facility], [address], [place], [place] [address]    Gram Stain NO WBC SEEN NO ORGANISMS SEEN     Culture      NO  GROWTH < 12 HOURS Performed at [facility], [address] [place], [place] [address]    Report Status PENDING   CBG monitoring, ED     Status: Abnormal   Collection Time: 02/11/24  5:23 PM  Result Value Ref Range   Glucose-Capillary 512 (HH) 70 - 99 mg/dL    Comment: Glucose reference range applies only to samples taken after fasting for at least 8 hours.  CBG monitoring, ED     Status: Abnormal   Collection Time: 02/11/24  8:28 PM  Result Value Ref Range   Glucose-Capillary 303 (H) 70 - 99 mg/dL    Comment: Glucose reference range applies only to samples taken after fasting for at least 8 hours.  MRSA Next Gen by PCR, Nasal     Status: Abnormal   Collection Time: 02/11/24  9:12 PM   Specimen: Nasal Mucosa; Nasal Swab  Result Value Ref Range   MRSA by PCR Next Gen DETECTED (A) NOT DETECTED    Comment: RESULT CALLED TO, READ BACK BY AND VERIFIED WITH: Elvin So [id number], VIRAY,J (NOTE) The GeneXpert MRSA Assay (FDA approved for NASAL specimens only), is one component of a comprehensive MRSA colonization surveillance program. It is not intended to diagnose MRSA infection nor to guide or monitor treatment for MRSA infections. Test performance is not FDA approved in patients less than 84 years old. Performed at [facility], [address] [place], [place] [address]   Glucose, capillary     Status: Abnormal   Collection Time: 02/11/24 10:03 PM  Result Value Ref Range   Glucose-Capillary 291 (H) 70 - 99 mg/dL    Comment: Glucose reference range applies only to samples taken after fasting for at least 8 hours.  Glucose, capillary     Status: Abnormal   Collection Time: 02/12/24 12:06 AM  Result Value Ref Range   Glucose-Capillary 271 (H) 70 - 99 mg/dL    Comment: Glucose reference range applies only to samples taken after fasting for at least 8 hours.  Glucose, capillary     Status: Abnormal   Collection Time: 02/12/24  2:09 AM  Result Value Ref Range    Glucose-Capillary 260 (  H) 70 - 99 mg/dL    Comment: Glucose reference range applies only to samples taken after fasting for at least 8 hours.  Basic metabolic panel     Status: Abnormal   Collection Time: 02/12/24  6:26 AM  Result Value Ref Range   Sodium 128 (L) 135 - 145 mmol/L   Potassium 4.5 3.5 - 5.1 mmol/L   Chloride 88 (L) 98 - 111 mmol/L   CO2 24 22 - 32 mmol/L   Glucose, Bld 277 (H) 70 - 99 mg/dL    Comment: Glucose reference range applies only to samples taken after fasting for at least 8 hours.   BUN 44 (H) 8 - 23 mg/dL   Creatinine, Ser 4.09 (H) 0.61 - 1.24 mg/dL   Calcium 9.1 8.9 - 81.1 mg/dL   GFR, Estimated 6 (L) >60 mL/min    Comment: (NOTE) Calculated using the CKD-EPI Creatinine Equation (2021)    Anion gap 16 (H) 5 - 15    Comment: Performed at [facility], [address] [place], [place] [address]  CBC     Status: Abnormal   Collection Time: 02/12/24  6:26 AM  Result Value Ref Range   WBC 13.0 (H) 4.0 - 10.5 K/uL   RBC 4.70 4.22 - 5.81 MIL/uL   Hemoglobin 13.0 13.0 - 17.0 g/dL   HCT 29.5 62.1 - 30.8 %   MCV 86.2 80.0 - 100.0 fL   MCH 27.7 26.0 - 34.0 pg   MCHC 32.1 30.0 - 36.0 g/dL   RDW 65.7 (H) 84.6 - 96.2 %   Platelets 249 150 - 400 K/uL   nRBC 0.0 0.0 - 0.2 %    Comment: Performed at [facility], [address] [place], [place] [address]  Culture, blood (x 2)     Status: None (Preliminary result)   Collection Time: 02/12/24  6:26 AM   Specimen: BLOOD  Result Value Ref Range   Specimen Description BLOOD BLOOD RIGHT HAND    Special Requests      BOTTLES DRAWN AEROBIC ONLY Blood Culture results may not be optimal due to an inadequate volume of blood received in culture bottles Performed at [facility], [address], [place], [place] [address]    Culture PENDING    Report Status PENDING   Lactic acid, plasma     Status: None   Collection Time: 02/12/24  6:26 AM  Result Value Ref Range   Lactic Acid, Venous 1.7 0.5 - 1.9 mmol/L    Comment:  Performed at [facility], [address] [place], [place] [address]  APTT     Status: None   Collection Time: 02/12/24  6:26 AM  Result Value Ref Range   aPTT 28 24 - 36 seconds    Comment: Performed at [facility], [address] [place], [place] [address]  Protime-INR     Status: Abnormal   Collection Time: 02/12/24  6:26 AM  Result Value Ref Range   Prothrombin Time 16.2 (H) 11.4 - 15.2 seconds   INR 1.3 (H) 0.8 - 1.2    Comment: (NOTE) INR goal varies based on device and disease states. Performed at [facility], [address], [place], [place] [address]   Culture, blood (x 2)     Status: None (Preliminary result)   Collection Time: 02/12/24  6:32 AM   Specimen: BLOOD  Result Value Ref Range   Specimen Description BLOOD RIGHT ANTECUBITAL    Special Requests      BOTTLES DRAWN  AEROBIC AND ANAEROBIC Blood Culture adequate volume Performed at [facility], [address], [place], [place] [address]    Culture PENDING    Report Status PENDING   Glucose, capillary     Status: Abnormal   Collection Time: 02/12/24  6:53 AM  Result Value Ref Range   Glucose-Capillary 321 (H) 70 - 99 mg/dL    Comment: Glucose reference range applies only to samples taken after fasting for at least 8 hours.  Glucose, capillary     Status: Abnormal   Collection Time: 02/12/24  8:05 AM  Result Value Ref Range   Glucose-Capillary 271 (H) 70 - 99 mg/dL    Comment: Glucose reference range applies only to samples taken after fasting for at least 8 hours.    No results found.  Outpt HD orders:  MWF RKC 4:15 180 NR, BFR 450/DFR 700 EDW 99.5kg 2K/2Ca 3/26 post wt 99.8kg, 3/24 101.3 hectorol 5 qtx No heparin  Assessment/Plan **Sepsis:  presumed urinary source but cultures are pending and he's on broad spectrum abx.    **ESRD on HD:  HD MWF. Due for HD today but BP is currently too low.  Labs look ok and volume acceptable.  WIll give midodrine 10mg  now, increase standing TID dose to 15mg , finish  infusion of albumin and reassess.  If BP doesn't improve options will include vasopressor support vs waiting to see if improved tomorrow.  If not improved he may need to transfer to [facility] for CRRT.  **Hyponatremia:  will correct with HD.   **Secondary hyperPTH: cont outpt VDRA IV with HD. Cont outpt binder.   **HFrEF:  EF 30-35%. Volume management with HD.  BP will limit UF today - does not appear to have clinically significant volume overload today.  **Anemia of CKD: Hb 13s, not active issue.  **DM with marked hyperglyemia on presentation:  BG improving with insulin here.   Nephrology is available over the weekend - reach out with concerns.  If pt stable will just be following remotely.  Tyler Pita 02/12/2024, 10:23 AM

## 2024-02-12 NOTE — Plan of Care (Signed)
  Problem: Acute Rehab PT Goals(only PT should resolve) Goal: Pt Will Go Supine/Side To Sit Outcome: Progressing Flowsheets (Taken 02/12/2024 1536) Pt will go Supine/Side to Sit: with minimal assist Goal: Patient Will Transfer Sit To/From Stand Outcome: Progressing Flowsheets (Taken 02/12/2024 1536) Patient will transfer sit to/from stand: with minimal assist Goal: Pt Will Transfer Bed To Chair/Chair To Bed Outcome: Progressing Flowsheets (Taken 02/12/2024 1536) Pt will Transfer Bed to Chair/Chair to Bed: with min assist Goal: Pt Will Ambulate Outcome: Progressing Flowsheets (Taken 02/12/2024 1536) Pt will Ambulate:  25 feet  with minimal assist  with moderate assist  with rolling walker   3:36 PM, 02/12/24 Ocie Bob, MPT Physical Therapist with [facility] 336 [phone] office [phone] mobile phone

## 2024-02-12 NOTE — Evaluation (Signed)
 Physical Therapy Evaluation Patient Details Name: [name] JOSLYN Sr. MRN: [id number] DOB: 1961/04/08 Today's Date: 02/12/2024  History of Present Illness  [name] [name]  is a 63 y.o. male,  with medical history significant of ESRD-HD (MWF), DM2, HTN, nonischemic cardiomyopathy, systolic CHF, prior right BKA with known history of orthostasis,.  -Patient recently secondary to complaints of dysuria, increased urinary frequency and urgency with dysuria, reports symptom has been ongoing for last 7 days, he saw his urgent care yesterday, where he was prescribed Cipro, and reported to provide urine sample for available from home, patient reports last dysuria was this morning, he still makes small amount of urine daily, has any fever, chills, he was prescribed ciprofloxacin by urgent care yesterday.  -In ED his workup was significant for hypotension, apparently he has not been taking his midodrine as instructed, this has resolved with fluid bolus and resuming midodrine, as well blood glucose was significantly elevated at 530, he received subcu insulin, In-N-Out was nonyielding with no urine could be obtained, [facility] hospitalist consulted to admit.   Clinical Impression  Patient demonstrates slow labored movement for sitting up at bedside, once seated able to maintain sitting balance, but poor carryover for attempting to scoot forward or laterally and sit to stands, transfers not attempted due to low BP - RN aware. Patient put back to bed after therapy. Patient will benefit from continued skilled physical therapy in hospital and recommended venue below to increase strength, balance, endurance for safe ADLs and gait.          If plan is discharge home, recommend the following: A lot of help with bathing/dressing/bathroom;A lot of help with walking and/or transfers;Help with stairs or ramp for entrance;Assistance with cooking/housework   Can travel by private vehicle   No    Equipment Recommendations None  recommended by PT  Recommendations for Other Services       Functional Status Assessment Patient has had a recent decline in their functional status and demonstrates the ability to make significant improvements in function in a reasonable and predictable amount of time.     Precautions / Restrictions Precautions Precautions: Fall Restrictions Weight Bearing Restrictions Per Provider Order: No      Mobility  Bed Mobility Overal bed mobility: Needs Assistance Bed Mobility: Sit to Supine, Supine to Sit     Supine to sit: Mod assist Sit to supine: Mod assist   General bed mobility comments: slow labored movement    Transfers                        Ambulation/Gait                  Stairs            Wheelchair Mobility     Tilt Bed    Modified Rankin (Stroke Patients Only)       Balance Overall balance assessment: Needs assistance Sitting-balance support: Feet supported, No upper extremity supported Sitting balance-Leahy Scale: Fair Sitting balance - Comments: seated at EOB                                     Pertinent Vitals/Pain Pain Assessment Pain Assessment: No/denies pain    Home Living Family/patient expects to be discharged to:: Private residence Living Arrangements: Other relatives Available Help at Discharge: Family;Available PRN/intermittently Type of Home: House Home Access: Stairs to enter  Entrance Stairs-Rails: Right;Left;Can reach both     Home Layout: One level Home Equipment: Agricultural consultant (2 wheels);Wheelchair - manual;Shower seat;BSC/3in1;Grab bars - tub/shower Additional Comments: uses Right BKA prosthetic leg    Prior Function Prior Level of Function : Independent/Modified Independent;Driving             Mobility Comments: Community ambulation using Right BKA prosthetic leg, drives ADLs Comments: Independent     Extremity/Trunk Assessment   Upper Extremity Assessment Upper Extremity  Assessment: Defer to OT evaluation    Lower Extremity Assessment Lower Extremity Assessment: Generalized weakness    Cervical / Trunk Assessment Cervical / Trunk Assessment: Normal  Communication   Communication Communication: No apparent difficulties    Cognition Arousal: Alert Behavior During Therapy: WFL for tasks assessed/performed   PT - Cognitive impairments: No apparent impairments                         Following commands: Intact       Cueing Cueing Techniques: Verbal cues, Tactile cues     General Comments      Exercises     Assessment/Plan    PT Assessment Patient needs continued PT services  PT Problem List Decreased strength;Decreased activity tolerance;Decreased balance;Decreased mobility       PT Treatment Interventions DME instruction;Gait training;Stair training;Functional mobility training;Therapeutic activities;Therapeutic exercise;Balance training;Patient/family education    PT Goals (Current goals can be found in the Care Plan section)  Acute Rehab PT Goals Patient Stated Goal: return home with family to assist PT Goal Formulation: With patient Time For Goal Achievement: 02/26/24 Potential to Achieve Goals: Good    Frequency Min 3X/week     Co-evaluation               AM-PAC PT "6 Clicks" Mobility  Outcome Measure Help needed turning from your back to your side while in a flat bed without using bedrails?: A Little Help needed moving from lying on your back to sitting on the side of a flat bed without using bedrails?: A Lot Help needed moving to and from a bed to a chair (including a wheelchair)?: Total Help needed standing up from a chair using your arms (e.g., wheelchair or bedside chair)?: Total Help needed to walk in hospital room?: Total Help needed climbing 3-5 steps with a railing? : Total 6 Click Score: 9    End of Session   Activity Tolerance: Patient tolerated treatment well;Patient limited by  fatigue;Patient limited by lethargy Patient left: in bed;with call bell/phone within reach Nurse Communication: Mobility status PT Visit Diagnosis: Unsteadiness on feet (R26.81);Other abnormalities of gait and mobility (R26.89);Muscle weakness (generalized) (M62.81)    Time: 2595-6387 PT Time Calculation (min) (ACUTE ONLY): 20 min   Charges:   PT Evaluation $PT Eval Moderate Complexity: 1 Mod PT Treatments $Therapeutic Activity: 8-22 mins PT General Charges $$ ACUTE PT VISIT: 1 Visit         3:34 PM, 02/12/24 Ocie Bob, MPT Physical Therapist with [facility] [phone] office [phone] mobile phone

## 2024-02-12 NOTE — Procedures (Signed)
  Nephrology Nursing Note:  SBPs mid/high 90s.  Pt having I/O catheterization presently.  Elects to have HD tomorrow morning.  OK with Dr. Glenna Fellows.  Orders modified.  Arman Filter, RN

## 2024-02-12 NOTE — Progress Notes (Signed)
 [facility] PROGRESS NOTE  [name] BORCHERDING Sr. (DOB: 1961/05/20) ZOX:[id number] PCP: Pcp, No  Brief Narrative: [name] Lance Sr. is a 63 y.o. male with a history of ESRD (HD on MWF), IDT2DM, HTN, HFrEF/NICM, PAD s/p right BKA, and orthostatic hypotension who presented to the ED 3/27 with dysuria, increased urinary frequency and urgency with dysuria, reports symptom has been ongoing for last 7 days. He had been prescribed cipro at urgent care the previous day but never got it. In the ED he was hyperglycemic and hypotensive (having not taken midodrine home medication) which improved with IV fluids initially. He was given insulin and midodrine, admitted to SDU, later developed fever so antibiotics initially given were broadened and cultures drawn.   Subjective: Tired because he slept poorly but no other complaints. His dysuria and penile pain have resolved.   Objective: BP 113/62   Pulse 80   Temp 98.5 F (36.9 C) (Oral)   Resp (!) 26   Ht 6\' 1"  (1.854 m)   Wt 105.7 kg   SpO2 98%   BMI 30.74 kg/m   Gen: No distress Pulm: Clear, nonlabored  CV: RRR, II/VI systolic murmur, no JVD or dependent edema GI: Soft, NT, ND, +BS Neuro: Rousable and oriented. No new focal deficits. Ext: Warm, dry with no wound on R BKA stump Skin: No new rashes, lesions or ulcers on visualized skin. Left arm AVF with eschar on distal upper arm, +thrill proximal forearm. No exudate or tenderness.  Assessment & Plan: Hypotension: Concern for sepsis with leukocytosis, fever, urinary source. Fortunately he's relatively asymptomatic and lactic acid is normal.  - Continue midodrine, agree with augmenting dose for now.  - Give albumin and monitor in SDU.  - Abx as below. Continue broad coverage pending work up, blood cultures were drawn ~6:30am 3/28.   Suspected UTI:  - Symptoms improved with antibiotics, will continue vancomycin and cefepime until more stable/culture data available. Still need to get urine for UA  and culture, will retry cath. D/w RN  IDT2DM: Uncontrolled with hyperglycemia. Severely elevated CBG initially though no ketoacidosis. Previous HbA1c >15.5% consistent with average blood sugar over 400mg /dl though recheck here was 8.4%.   - Glycemic control improving with subcutaneous insulin. Will convert 70/30 to basal-bolus and monitor AC/HS.   ESRD:  - Hyponatremia and volume status to be managed with HD. BP too soft for hemodialysis and no emergent indications at this time, will continue to watch. If improved hemodynamics tomorrow, pursue HD. May need to consider CRRT if not. D/w nephrology today.   CAD, HLD, s/p right BKA: No chest pain.  - Continue aspirin, statin.   Obesity: Body mass index is 30.74 kg/m.   Tyrone Nine, MD [facility] [email or web address] 02/12/2024, 3:56 PM

## 2024-02-12 NOTE — Progress Notes (Signed)
 Pharmacy Antibiotic Note  [name] [name]. is a 63 y.o. male with ESRD on HD MWF admitted on 02/11/2024 with sepsis.  Pharmacy has been consulted for Vancomycin and Cefepime  dosing.  Plan: Vancomycin 2000 mg IV now, then 1000 mg IV after each HD Cefepime 2 g IV now, then 1 g IV q24h  Height: 6\' 1"  (185.4 cm) Weight: 105.7 kg (233 lb) IBW/kg (Calculated) : 79.9  Temp (24hrs), Avg:99.2 F (37.3 C), Min:98.4 F (36.9 C), Max:100.4 F (38 C)  Recent Labs  Lab 02/11/24 1427  WBC 13.6*  CREATININE 8.70*    Estimated Creatinine Clearance: 11.4 mL/min (A) (by C-G formula based on SCr of 8.7 mg/dL (H)).    Allergies  Allergen Reactions   Sulfa Antibiotics Diarrhea and Nausea And Vomiting    [name] [name] 02/12/2024 4:14 AM

## 2024-02-12 NOTE — Progress Notes (Signed)
 Patient hypotensive again. He has developed a fever since time of admission and sepsis needs to be considered. Plan to culture blood, check lactate, broaden antibiotics for now.

## 2024-02-13 DIAGNOSIS — E1165 Type 2 diabetes mellitus with hyperglycemia: Secondary | ICD-10-CM | POA: Diagnosis not present

## 2024-02-13 LAB — RENAL FUNCTION PANEL
Albumin: 3.4 g/dL — ABNORMAL LOW (ref 3.5–5.0)
Anion gap: 19 — ABNORMAL HIGH (ref 5–15)
BUN: 59 mg/dL — ABNORMAL HIGH (ref 8–23)
CO2: 21 mmol/L — ABNORMAL LOW (ref 22–32)
Calcium: 9 mg/dL (ref 8.9–10.3)
Chloride: 89 mmol/L — ABNORMAL LOW (ref 98–111)
Creatinine, Ser: 11.53 mg/dL — ABNORMAL HIGH (ref 0.61–1.24)
GFR, Estimated: 5 mL/min — ABNORMAL LOW (ref >60)
Glucose, Bld: 153 mg/dL — ABNORMAL HIGH (ref 70–99)
Phosphorus: 5.6 mg/dL — ABNORMAL HIGH (ref 2.5–4.6)
Potassium: 4.7 mmol/L (ref 3.5–5.1)
Sodium: 129 mmol/L — ABNORMAL LOW (ref 135–145)

## 2024-02-13 LAB — POCT I-STAT, CHEM 8
BUN: 33 mg/dL — ABNORMAL HIGH (ref 8–23)
Calcium, Ion: 0.98 mmol/L — ABNORMAL LOW (ref 1.15–1.40)
Chloride: 86 mmol/L — ABNORMAL LOW (ref 98–111)
Creatinine, Ser: 9.3 mg/dL — ABNORMAL HIGH (ref 0.61–1.24)
Glucose, Bld: 554 mg/dL (ref 70–99)
HCT: 49 % (ref 39.0–52.0)
Hemoglobin: 16.7 g/dL (ref 13.0–17.0)
Potassium: 4.8 mmol/L (ref 3.5–5.1)
Sodium: 126 mmol/L — ABNORMAL LOW (ref 135–145)
TCO2: 30 mmol/L (ref 22–32)

## 2024-02-13 LAB — CBC
HCT: 41.2 % (ref 39.0–52.0)
Hemoglobin: 13.2 g/dL (ref 13.0–17.0)
MCH: 27.6 pg (ref 26.0–34.0)
MCHC: 32 g/dL (ref 30.0–36.0)
MCV: 86 fL (ref 80.0–100.0)
Platelets: 261 10*3/uL (ref 150–400)
RBC: 4.79 MIL/uL (ref 4.22–5.81)
RDW: 16.4 % — ABNORMAL HIGH (ref 11.5–15.5)
WBC: 10.9 10*3/uL — ABNORMAL HIGH (ref 4.0–10.5)
nRBC: 0 % (ref 0.0–0.2)

## 2024-02-13 LAB — GLUCOSE, CAPILLARY
Glucose-Capillary: 158 mg/dL — ABNORMAL HIGH (ref 70–99)
Glucose-Capillary: 106 mg/dL — ABNORMAL HIGH (ref 70–99)
Glucose-Capillary: 106 mg/dL — ABNORMAL HIGH (ref 70–99)
Glucose-Capillary: 102 mg/dL — ABNORMAL HIGH (ref 70–99)

## 2024-02-13 MED ORDER — ALBUMIN HUMAN 25 % IV SOLN
25.0000 g | Freq: Once | INTRAVENOUS | Status: AC
  Administered 2024-02-13: 25 g via INTRAVENOUS
  Filled 2024-02-13: qty 100

## 2024-02-13 MED ORDER — HEPARIN SODIUM (PORCINE) 1000 UNIT/ML IJ SOLN
INTRAMUSCULAR | Status: AC
  Filled 2024-02-13: qty 3

## 2024-02-13 MED ORDER — PENTAFLUOROPROP-TETRAFLUOROETH EX AERO
INHALATION_SPRAY | CUTANEOUS | Status: AC
  Filled 2024-02-13: qty 30

## 2024-02-13 MED ORDER — LIDOCAINE-PRILOCAINE 2.5-2.5 % EX CREA
TOPICAL_CREAM | CUTANEOUS | Status: DC
  Administered 2024-02-15: 1 via TOPICAL

## 2024-02-13 MED ORDER — SEVELAMER CARBONATE 800 MG PO TABS
ORAL_TABLET | ORAL | Status: AC
  Filled 2024-02-13: qty 5

## 2024-02-13 MED ORDER — HEPARIN BOLUS VIA INFUSION
3000.0000 [IU] | Freq: Once | INTRAVENOUS | Status: DC
  Filled 2024-02-13: qty 3000

## 2024-02-13 MED ORDER — MIDODRINE HCL 5 MG PO TABS
ORAL_TABLET | ORAL | Status: AC
  Filled 2024-02-13: qty 3

## 2024-02-13 MED ORDER — ONDANSETRON HCL 4 MG/2ML IJ SOLN
4.0000 mg | Freq: Four times a day (QID) | INTRAMUSCULAR | Status: DC | PRN
  Administered 2024-02-13 – 2024-02-15 (×2): 4 mg via INTRAVENOUS
  Filled 2024-02-13 (×2): qty 2

## 2024-02-13 MED ORDER — LIDOCAINE-PRILOCAINE 2.5-2.5 % EX CREA: 1.0000 | TOPICAL_CREAM | CUTANEOUS | Status: DC | PRN

## 2024-02-13 MED ORDER — HEPARIN SODIUM (PORCINE) 1000 UNIT/ML IJ SOLN
3000.0000 [IU] | Freq: Once | INTRAMUSCULAR | Status: AC
  Administered 2024-02-13: 3000 [IU] via INTRAVENOUS

## 2024-02-13 MED ORDER — ALBUMIN HUMAN 25 % IV SOLN
25.0000 g | Freq: Once | INTRAVENOUS | Status: AC
  Administered 2024-02-13: 25 g via INTRAVENOUS

## 2024-02-13 MED ORDER — DOXERCALCIFEROL 4 MCG/2ML IV SOLN
INTRAVENOUS | Status: AC
Start: 2024-02-13 — End: ?
  Filled 2024-02-13: qty 4

## 2024-02-13 MED ORDER — PENTAFLUOROPROP-TETRAFLUOROETH EX AERO
1.0000 | INHALATION_SPRAY | CUTANEOUS | Status: DC | PRN
  Administered 2024-02-13: 1 via TOPICAL
  Filled 2024-02-13: qty 30

## 2024-02-13 NOTE — Progress Notes (Signed)
 Patient ID: [name] Lance Sr., male   DOB: [birth year], 63 y.o.   MRN: [id number] S: Feeling better today. O:BP 107/66   Pulse 68   Temp 98 F (36.7 C) (Oral)   Resp (!) 23   Ht 6\' 1"  (1.854 m)   Wt 105.7 kg   SpO2 98%   BMI 30.74 kg/m   Intake/Output Summary (Last 24 hours) at 02/13/2024 1151 Last data filed at 02/13/2024 0805 Gross per 24 hour  Intake 680.65 ml  Output 3 ml  Net 677.65 ml   Intake/Output: I/O last 3 completed shifts: In: 780.7 [P.O.:480; IV Piggyback:300.7] Out: 3 [Urine:3]  Intake/Output this shift:  Total I/O In: 240 [P.O.:240] Out: -  Weight change:  Gen: NAD CVS: RRR Resp: CTA Abd: +BS, soft, NT/ND  Ext:s/p RBKA, no edema, LUE AVF +T/B  Recent Labs  Lab 02/11/24 1427 02/11/24 1436 02/12/24 0626 02/13/24 0656  NA 125* 126* 128* 129*  K 4.7 4.8 4.5 4.7  CL 80* 86* 88* 89*  CO2 26  --  24 21*  GLUCOSE 530* 554* 277* 153*  BUN 34* 33* 44* 59*  CREATININE 8.70* 9.30* 9.60* 11.53*  ALBUMIN 3.4*  --   --  3.4*  CALCIUM 8.9  --  9.1 9.0  PHOS 4.7*  --   --  5.6*  AST 17  --   --   --   ALT 18  --   --   --    Liver Function Tests: Recent Labs  Lab 02/11/24 1427 02/13/24 0656  AST 17  --   ALT 18  --   ALKPHOS 137*  --   BILITOT 1.1  --   PROT 9.4*  --   ALBUMIN 3.4* 3.4*   No results for input(s): "LIPASE", "AMYLASE" in the last 168 hours. No results for input(s): "AMMONIA" in the last 168 hours. CBC: Recent Labs  Lab 02/11/24 1427 02/11/24 1436 02/12/24 0626 02/13/24 0656  WBC 13.6*  --  13.0* 10.9*  HGB 13.7 16.7 13.0 13.2  HCT 43.0 49.0 40.5 41.2  MCV 87.0  --  86.2 86.0  PLT 252  --  249 261   Cardiac Enzymes: No results for input(s): "CKTOTAL", "CKMB", "CKMBINDEX", "TROPONINI" in the last 168 hours. CBG: Recent Labs  Lab 02/12/24 1510 02/12/24 1550 02/12/24 2119 02/13/24 0733 02/13/24 1130  GLUCAP 162* 141* 239* 158* 106*    Iron Studies: No results for input(s): "IRON", "TIBC", "TRANSFERRIN", "FERRITIN" in  the last 72 hours. Studies/Results: No results found.  aspirin EC  81 mg Oral Daily   atorvastatin  40 mg Oral Daily   Chlorhexidine Gluconate Cloth  6 each Topical Q0600   cinacalcet  60 mg Oral Q breakfast   doxercalciferol  5 mcg Intravenous Q M,W,F-HD   heparin  5,000 Units Subcutaneous Q8H   insulin aspart  0-15 Units Subcutaneous TID WC   insulin aspart  0-5 Units Subcutaneous QHS   insulin aspart  5 Units Subcutaneous TID WC   insulin glargine-yfgn  38 Units Subcutaneous Daily   midodrine  15 mg Oral TID WC   mupirocin ointment  1 Application Nasal BID   pantoprazole  40 mg Oral Daily   sevelamer carbonate  4,000 mg Oral TID WC    BMET    Component Value Date/Time   NA 129 (L) 02/13/2024 0656   K 4.7 02/13/2024 0656   CL 89 (L) 02/13/2024 0656   CO2 21 (L) 02/13/2024 0656   GLUCOSE 153 (  H) 02/13/2024 0656   BUN 59 (H) 02/13/2024 0656   CREATININE 11.53 (H) 02/13/2024 0656   CALCIUM 9.0 02/13/2024 0656   GFRNONAA 5 (L) 02/13/2024 0656   GFRAA 3 (L) 08/14/2019 2051   CBC    Component Value Date/Time   WBC 10.9 (H) 02/13/2024 0656   RBC 4.79 02/13/2024 0656   HGB 13.2 02/13/2024 0656   HGB 13.0 12/17/2022 1242   HCT 41.2 02/13/2024 0656   PLT 261 02/13/2024 0656   PLT 272 12/17/2022 1242   MCV 86.0 02/13/2024 0656   MCH 27.6 02/13/2024 0656   MCHC 32.0 02/13/2024 0656   RDW 16.4 (H) 02/13/2024 0656   LYMPHSABS 1.3 01/15/2024 1729   MONOABS 1.4 (H) 01/15/2024 1729   EOSABS 0.5 01/15/2024 1729   BASOSABS 0.1 01/15/2024 1729    Outpt HD orders:  MWF RKC 4:15 180 NR, BFR 450/DFR 700 EDW 99.5kg 2K/2Ca 3/26 post wt 99.8kg, 3/24 101.3 hectorol 5 qtx No heparin   Assessment/Plan **Sepsis:  presumed urinary source but cultures are pending and he's on broad spectrum abx.     **ESRD on HD:  HD MWF. Due for HD today but BP is currently too low.  Labs look ok and volume acceptable.  WIll give midodrine 10mg  now, increase standing TID dose to 15mg , finish  infusion of albumin.  Bp was still too low yesterday but appears to be better today.  Will attempt HD today with midodrine and IV albumin prn.  If BP doesn't improve options will include vasopressor support vs transfer to [facility] for CRRT.   **Hyponatremia:  will correct with HD.    **Secondary hyperPTH: cont outpt VDRA IV with HD. Cont outpt binder.    **HFrEF:  EF 30-35%. Volume management with HD.  BP will limit UF today - does not appear to have clinically significant volume overload today.   **Anemia of CKD: Hb 13s, not active issue.   **DM with marked hyperglyemia on presentation:  BG improving with insulin here.    Nephrology is available over the weekend - reach out with concerns.  If pt stable will just be following remotely.    Irena Cords, MD [employer] [phone]

## 2024-02-13 NOTE — Progress Notes (Signed)
  Nephrology Nursing Note:  HB Surface Antigen Non-reactive 02/12/24.  HBsAb+ within the last 12 months, per below results obtained from [facility] / CareEverywhere  Infectious Diseases Result Type Result Value Relevant Reference Range Interpretation Date  Hep B core Ab Total (anti-HBc) Negative No Reference Range Provided - February 23, 2023  HCV Ab (anti-HCV) Nonreactive No Reference Range Provided - Apr 01, 2023  Hep B Surface Ab (anti-HBs) 329 mIU/mL No Reference Range Provided - Apr 01, 2023  Hep B Surface Ag (HBsAg) Negative No Reference Range Provided - September 02, 2023     Arman Filter, RN [facility]

## 2024-02-13 NOTE — Progress Notes (Signed)
 [facility] PROGRESS NOTE  [name] SNUFFER Sr. (DOB: 10/13/1961) FAO:[id number] PCP: Pcp, No  Brief Narrative: [name] Lance Sr. is a 63 y.o. male with a history of ESRD (HD on MWF), IDT2DM, HTN, HFrEF/NICM, PAD s/p right BKA, and orthostatic hypotension who presented to the ED 3/27 with dysuria, increased urinary frequency and urgency with dysuria, reports symptom has been ongoing for last 7 days. He had been prescribed cipro at urgent care the previous day but never got it. In the ED he was hyperglycemic and hypotensive (having not taken midodrine home medication) which improved with IV fluids initially. He was given insulin and midodrine, admitted to SDU, later developed fever so antibiotics initially given were broadened and cultures drawn.   Subjective: Having hiccups overnight into today, he's hungry without other complaints.   Objective: BP (!) 99/57   Pulse 75   Temp 98.5 F (36.9 C) (Oral)   Resp (!) 24   Ht 6\' 1"  (1.854 m)   Wt 105.7 kg   SpO2 (!) 81%   BMI 30.74 kg/m   Gen: No distress Pulm: Nonlabored but tachypneic and CTAB CV: RRR, no rub, stable systolic murmur, no LE edema GI: +BS, NT, ND, soft Neuro: Alert and oriented, sparse verbalization but normal speech.  Ext: Warm, dry hands and warm LE's, stable L BKA Skin: Stable left arm AVF with eschar on distal upper arm, +thrill proximal forearm. No exudate or tenderness. Penis appears normal.   Assessment & Plan: Hypotension: Concern for sepsis with leukocytosis, fever, urinary source. Fortunately he's relatively asymptomatic and lactic acid is normal.  - Continue midodrine, increased dose per nephrology.   - Give additional albumin and continue to monitor in SDU.  - Abx as below.  - Hgb stable, normal  Suspected UTI:  - Symptoms improved with antibiotics, will continue vancomycin and cefepime until more stable/culture data available. Urine culture sent from cath specimen (~23ml brown pus per RN) sent 3/28,  pending, superficial specimen sent from ED 3/27 without growth, neg gram stain, and blood Cx's NGTD > 24 hours.    IDT2DM: Uncontrolled with hyperglycemia. Severely elevated CBG initially though no ketoacidosis. Previous HbA1c >15.5% consistent with average blood sugar over 400mg /dl though recheck here was 8.4%.   - Glycemic control improving with subcutaneous insulin. Will convert 70/30 to basal-bolus and monitor AC/HS.   ESRD:  - Hyponatremia and volume status to be managed with HD. BP too soft for hemodialysis and no emergent indications at this time, will continue to watch. If improved hemodynamics tomorrow, pursue HD. May need to consider CRRT if not. D/w nephrology today.   CAD, HLD, s/p right BKA: No chest pain.  - Continue aspirin, statin.   Obesity: Body mass index is 30.74 kg/m.   [name] Nine, MD [facility] [email or web address] 02/13/2024, 8:06 AM

## 2024-02-13 NOTE — Plan of Care (Signed)

## 2024-02-13 NOTE — Progress Notes (Signed)
   HEMODIALYSIS TREATMENT NOTE:  3.75h low-heparin tx completed using left upper arm AVF. Goal met: 2 liters removed.  Albumin 25g given for one hypotensive episode (asymptomatic). All blood was returned.  Vancomycin given during last hour.  Post-HD:  02/13/24 2117  Vital Signs  Temp 98.8 F (37.1 C)  Temp Source Oral  Pulse Rate 71  Pulse Rate Source Monitor  Resp 14  BP 108/68  BP Location Right Arm  BP Method Automatic  Patient Position (if appropriate) Lying  Oxygen Therapy  SpO2 99 %  O2 Device Room Air  Pain Assessment  Pain Score 0  Dialysis Weight  Weight 99.8 kg  Type of Weight Post-Dialysis  Post Treatment  Dialyzer Clearance Lightly streaked  Hemodialysis Intake (mL) 100 mL  Liters Processed 75.9  Fluid Removed (mL) 2000 mL  Tolerated HD Treatment Yes  Post-Hemodialysis Comments Goal met  AVG/AVF Arterial Site Held (minutes) 7 minutes  AVG/AVF Venous Site Held (minutes) 7 minutes  Fistula / Graft Left Upper arm Arteriovenous fistula  Placement Date/Time: 09/22/15 0114   Placed prior to admission: Yes  Orientation: (c) Left  Access Location: Upper arm  Access Type: (c) Arteriovenous fistula  Fistula / Graft Assessment Thrill;Bruit  Status Patent    Arman Filter, RN [facility]

## 2024-02-13 NOTE — Plan of Care (Signed)
  Problem: Education: Goal: Knowledge of General Education information will improve Description: Including pain rating scale, medication(s)/side effects and non-pharmacologic comfort measures Outcome: Progressing   Problem: Coping: Goal: Level of anxiety will decrease Outcome: Progressing   Problem: Safety: Goal: Ability to remain free from injury will improve Outcome: Progressing   Problem: Skin Integrity: Goal: Risk for impaired skin integrity will decrease Outcome: Progressing   Problem: Coping: Goal: Ability to adjust to condition or change in health will improve Outcome: Progressing

## 2024-02-14 ENCOUNTER — Encounter (HOSPITAL_COMMUNITY): Payer: Self-pay | Admitting: Internal Medicine

## 2024-02-14 DIAGNOSIS — E1165 Type 2 diabetes mellitus with hyperglycemia: Secondary | ICD-10-CM | POA: Diagnosis not present

## 2024-02-14 LAB — GLUCOSE, CAPILLARY
Glucose-Capillary: 280 mg/dL — ABNORMAL HIGH (ref 70–99)
Glucose-Capillary: 272 mg/dL — ABNORMAL HIGH (ref 70–99)
Glucose-Capillary: 157 mg/dL — ABNORMAL HIGH (ref 70–99)
Glucose-Capillary: 68 mg/dL — ABNORMAL LOW (ref 70–99)
Glucose-Capillary: 93 mg/dL (ref 70–99)

## 2024-02-14 LAB — AEROBIC CULTURE W GRAM STAIN (SUPERFICIAL SPECIMEN): Gram Stain: NONE SEEN

## 2024-02-14 LAB — URINE CULTURE: Culture: NO GROWTH

## 2024-02-14 LAB — HEPATITIS B SURFACE ANTIBODY, QUANTITATIVE: Hep B S AB Quant (Post): 205 m[IU]/mL

## 2024-02-14 NOTE — Progress Notes (Signed)
 [facility] PROGRESS NOTE  DEVAN DANZER Sr. (DOB: 03-27-1961) AOZ:[id number] PCP: Pcp, No  Brief Narrative: [name] is a 63 y.o. male with a history of ESRD (HD on MWF), IDT2DM, HTN, HFrEF/NICM, PAD s/p right BKA, and orthostatic hypotension who presented to the ED 3/27 with dysuria, increased urinary frequency and urgency with dysuria, reports symptom has been ongoing for last 7 days. He had been prescribed cipro at urgent care the previous day but never got it. In the ED he was hyperglycemic and hypotensive (having not taken midodrine home medication) which improved with IV fluids initially. He was given insulin and midodrine, admitted to SDU, later developed fever so antibiotics initially given were broadened and cultures drawn.   Subjective: Feels better, resting well, tolerated dialysis just fine, no fevers or penile pain. Considering rehabilitation. Still having some hiccups but not bothered by them. Has good appetite.   Objective: BP 102/66   Pulse 75   Temp 98.2 F (36.8 C) (Oral)   Resp 16   Ht 6\' 1"  (1.854 m)   Wt 99.8 kg   SpO2 98%   BMI 29.03 kg/m   Gen: No distress Pulm: Clear, nonlabored, normal rate  CV: RRR, II/VI systolic murmur at sternal border, no pitting edema GI: Soft, NT, ND, +BS  Neuro: Alert and oriented. No new focal deficits. Ext: Warm, dry, stable BKA on the RIGHT Skin: No new wounds. AVF +T/B.   Assessment & Plan: Hypotension: Concern for sepsis with leukocytosis, fever, urinary source. Fortunately he's relatively asymptomatic and lactic acid is normal.  - Continue midodrine 15mg  TID - Got albumin w/HD yesterday, BP is stabilizing in 100's / 60's, asymptomatic.  - Abx as below.  - Hgb stable, normal  UTI:  - Blood cultures NGTD, urine culture still not resulted, wound culture taken in ED is of unclear significance, showing Haemophilus influenzae. Given his clinical stability, will continue current plan of care.    IDT2DM:  Uncontrolled with hyperglycemia. Severely elevated CBG initially though no ketoacidosis. Previous HbA1c >15.5% consistent with average blood sugar over 400mg /dl though recheck here was 8.4%.   - Glycemic control improved at goal, we converted 70/30 to basal-bolus insulin AC/HS.   ESRD:  - Hyponatremia and volume status to be managed with HD.    CAD, HLD, s/p right BKA: No chest pain.  - Continue aspirin, statin.   Obesity: Body mass index is 29.03 kg/m.   Tyrone Nine, MD [facility] [email or web address] 02/14/2024, 8:54 AM

## 2024-02-14 NOTE — Plan of Care (Signed)

## 2024-02-14 NOTE — TOC Transition Note (Signed)
 Transition of Care [facility]) - Discharge Note   Patient Details  Name: [name] ODDO Sr. MRN: [id number] Date of Birth: 10/21/1961  Transition of Care [facility]) CM/SW Contact:  Anabel Halon, RN Phone Number: 02/14/2024, 6:23 PM   Clinical Narrative:   CM met with patient at bedside. Patient reported he did nit want to go to SNF. Patient stated he want HH. LOC will continue to monitor patient advancement through interdisciplinary progression rounds      Barriers to Discharge: Continued Medical Work up   Patient Goals and CMS Choice Patient states their goals for this hospitalization and ongoing recovery are:: Return to baseline.          Discharge Placement                       Discharge Plan and Services Additional resources added to the After Visit Summary for   In-house Referral: Clinical Social Work Discharge Planning Services: CM Consult                                 Social Drivers of Health (SDOH) Interventions SDOH Screenings   Food Insecurity: No Food Insecurity (02/11/2024)  Housing: Low Risk  (02/11/2024)  Transportation Needs: No Transportation Needs (02/11/2024)  Utilities: Not At Risk (02/11/2024)  Alcohol Screen: Low Risk  (02/04/2022)  Depression (PHQ2-9): Low Risk  (08/06/2022)  Tobacco Use: Low Risk  (02/14/2024)     Readmission Risk Interventions    02/11/2024    9:09 PM 11/05/2022    3:08 PM  Readmission Risk Prevention Plan  Transportation Screening Complete Complete  Medication Review Oceanographer) Complete Complete  PCP or Specialist appointment within 3-5 days of discharge Complete Complete  HRI or Home Care Consult Complete Complete  SW Recovery Care/Counseling Consult  Complete  Palliative Care Screening Not Applicable Complete  Skilled Nursing Facility Complete Complete

## 2024-02-14 NOTE — Progress Notes (Signed)
 CBG 68, gave 8 ounce of juice, re-check 93.

## 2024-02-14 NOTE — Plan of Care (Signed)
  Problem: Education: Goal: Knowledge of General Education information will improve Description: Including pain rating scale, medication(s)/side effects and non-pharmacologic comfort measures Outcome: Progressing   Problem: Clinical Measurements: Goal: Ability to maintain clinical measurements within normal limits will improve Outcome: Progressing   Problem: Coping: Goal: Level of anxiety will decrease Outcome: Progressing   Problem: Pain Managment: Goal: General experience of comfort will improve and/or be controlled Outcome: Progressing   Problem: Safety: Goal: Ability to remain free from injury will improve Outcome: Progressing   Problem: Skin Integrity: Goal: Risk for impaired skin integrity will decrease Outcome: Progressing   Problem: Education: Goal: Ability to describe self-care measures that may prevent or decrease complications (Diabetes Survival Skills Education) will improve Outcome: Progressing Goal: Individualized Educational Video(s) Outcome: Progressing

## 2024-02-15 DIAGNOSIS — E1165 Type 2 diabetes mellitus with hyperglycemia: Secondary | ICD-10-CM | POA: Diagnosis not present

## 2024-02-15 LAB — CBC
HCT: 42.7 % (ref 39.0–52.0)
Hemoglobin: 13.8 g/dL (ref 13.0–17.0)
MCH: 27.5 pg (ref 26.0–34.0)
MCHC: 32.3 g/dL (ref 30.0–36.0)
MCV: 85.2 fL (ref 80.0–100.0)
Platelets: 304 10*3/uL (ref 150–400)
RBC: 5.01 MIL/uL (ref 4.22–5.81)
RDW: 16.5 % — ABNORMAL HIGH (ref 11.5–15.5)
WBC: 8.9 10*3/uL (ref 4.0–10.5)
nRBC: 0 % (ref 0.0–0.2)

## 2024-02-15 LAB — GLUCOSE, CAPILLARY
Glucose-Capillary: 168 mg/dL — ABNORMAL HIGH (ref 70–99)
Glucose-Capillary: 213 mg/dL — ABNORMAL HIGH (ref 70–99)
Glucose-Capillary: 179 mg/dL — ABNORMAL HIGH (ref 70–99)
Glucose-Capillary: 107 mg/dL — ABNORMAL HIGH (ref 70–99)

## 2024-02-15 LAB — RENAL FUNCTION PANEL
Albumin: 3.3 g/dL — ABNORMAL LOW (ref 3.5–5.0)
Anion gap: 20 — ABNORMAL HIGH (ref 5–15)
BUN: 56 mg/dL — ABNORMAL HIGH (ref 8–23)
CO2: 24 mmol/L (ref 22–32)
Calcium: 9.7 mg/dL (ref 8.9–10.3)
Chloride: 88 mmol/L — ABNORMAL LOW (ref 98–111)
Creatinine, Ser: 10.56 mg/dL — ABNORMAL HIGH (ref 0.61–1.24)
GFR, Estimated: 5 mL/min — ABNORMAL LOW (ref >60)
Glucose, Bld: 177 mg/dL — ABNORMAL HIGH (ref 70–99)
Phosphorus: 5 mg/dL — ABNORMAL HIGH (ref 2.5–4.6)
Potassium: 4.2 mmol/L (ref 3.5–5.1)
Sodium: 132 mmol/L — ABNORMAL LOW (ref 135–145)

## 2024-02-15 MED ORDER — VANCOMYCIN HCL IN DEXTROSE 1-5 GM/200ML-% IV SOLN
1000.0000 mg | INTRAVENOUS | Status: DC
  Administered 2024-02-15 – 2024-02-17 (×2): 1000 mg via INTRAVENOUS
  Filled 2024-02-15 (×2): qty 200

## 2024-02-15 MED ORDER — MIDODRINE HCL 5 MG PO TABS
ORAL_TABLET | ORAL | Status: AC
  Filled 2024-02-15: qty 3

## 2024-02-15 MED ORDER — ALBUMIN HUMAN 25 % IV SOLN
INTRAVENOUS | Status: AC
  Filled 2024-02-15: qty 100

## 2024-02-15 MED ORDER — DOXERCALCIFEROL 4 MCG/2ML IV SOLN
INTRAVENOUS | Status: AC
  Filled 2024-02-15: qty 4

## 2024-02-15 MED ORDER — VANCOMYCIN HCL IN DEXTROSE 1-5 GM/200ML-% IV SOLN: 1000.0000 mg | INTRAVENOUS | Status: DC

## 2024-02-15 MED ORDER — ALBUMIN HUMAN 25 % IV SOLN
25.0000 g | INTRAVENOUS | Status: AC | PRN
  Administered 2024-02-15 (×2): 25 g via INTRAVENOUS

## 2024-02-15 MED ORDER — LIDOCAINE-PRILOCAINE 2.5-2.5 % EX CREA
TOPICAL_CREAM | CUTANEOUS | Status: AC
  Filled 2024-02-15: qty 5

## 2024-02-15 MED ORDER — PROCHLORPERAZINE EDISYLATE 10 MG/2ML IJ SOLN
10.0000 mg | Freq: Four times a day (QID) | INTRAMUSCULAR | Status: DC | PRN
  Filled 2024-02-15: qty 2

## 2024-02-15 MED ORDER — HEPARIN SODIUM (PORCINE) 1000 UNIT/ML IJ SOLN
INTRAMUSCULAR | Status: AC
  Filled 2024-02-15: qty 5

## 2024-02-15 NOTE — Plan of Care (Signed)
  Problem: Education: Goal: Knowledge of General Education information will improve Description: Including pain rating scale, medication(s)/side effects and non-pharmacologic comfort measures Outcome: Progressing   Problem: Health Behavior/Discharge Planning: Goal: Ability to manage health-related needs will improve Outcome: Progressing   Problem: Clinical Measurements: Goal: Ability to maintain clinical measurements within normal limits will improve Outcome: Progressing Goal: Will remain free from infection Outcome: Progressing Goal: Diagnostic test results will improve Outcome: Progressing Goal: Respiratory complications will improve Outcome: Progressing Goal: Cardiovascular complication will be avoided Outcome: Progressing   Problem: Activity: Goal: Risk for activity intolerance will decrease Outcome: Progressing   Problem: Nutrition: Goal: Adequate nutrition will be maintained Outcome: Progressing   Problem: Coping: Goal: Level of anxiety will decrease Outcome: Progressing   Problem: Elimination: Goal: Will not experience complications related to bowel motility Outcome: Progressing Goal: Will not experience complications related to urinary retention Outcome: Progressing   Problem: Pain Managment: Goal: General experience of comfort will improve and/or be controlled Outcome: Progressing   Problem: Safety: Goal: Ability to remain free from injury will improve Outcome: Progressing   Problem: Skin Integrity: Goal: Risk for impaired skin integrity will decrease Outcome: Progressing   Problem: Education: Goal: Ability to describe self-care measures that may prevent or decrease complications (Diabetes Survival Skills Education) will improve Outcome: Progressing Goal: Individualized Educational Video(s) Outcome: Progressing   Problem: Coping: Goal: Ability to adjust to condition or change in health will improve Outcome: Progressing   Problem: Fluid  Volume: Goal: Ability to maintain a balanced intake and output will improve Outcome: Progressing   Problem: Health Behavior/Discharge Planning: Goal: Ability to identify and utilize available resources and services will improve Outcome: Progressing Goal: Ability to manage health-related needs will improve Outcome: Progressing   Problem: Metabolic: Goal: Ability to maintain appropriate glucose levels will improve Outcome: Progressing   Problem: Nutritional: Goal: Maintenance of adequate nutrition will improve Outcome: Progressing Goal: Progress toward achieving an optimal weight will improve Outcome: Progressing   Problem: Skin Integrity: Goal: Risk for impaired skin integrity will decrease Outcome: Progressing   Problem: Tissue Perfusion: Goal: Adequacy of tissue perfusion will improve Outcome: Progressing   Problem: Fluid Volume: Goal: Hemodynamic stability will improve Outcome: Progressing   Problem: Clinical Measurements: Goal: Diagnostic test results will improve Outcome: Progressing Goal: Signs and symptoms of infection will decrease Outcome: Progressing   Problem: Respiratory: Goal: Ability to maintain adequate ventilation will improve Outcome: Progressing

## 2024-02-15 NOTE — Progress Notes (Signed)
 Patient ID: [name] Lance Sr., male   DOB: Mar 24, 1961, 63 y.o.   MRN: [id number] S: No complaints or events overnight O:BP 117/62   Pulse 69   Temp 98.1 F (36.7 C) (Oral)   Resp 17   Ht 6\' 1"  (1.854 m)   Wt 99.8 kg   SpO2 96%   BMI 29.03 kg/m   Intake/Output Summary (Last 24 hours) at 02/15/2024 0908 Last data filed at 02/14/2024 1500 Gross per 24 hour  Intake 0 ml  Output --  Net 0 ml   Intake/Output: I/O last 3 completed shifts: In: 520 [P.O.:120; IV Piggyback:400] Out: 2000 [Other:2000]  Intake/Output this shift:  No intake/output data recorded. Weight change:  Gen: NAD  CVS:RRR Resp: CTA Abd: +BS, soft, NT/ND Ext: no edema, s/p RBKA, LUE AVF +T/B  Recent Labs  Lab 02/11/24 1427 02/11/24 1436 02/12/24 0626 02/13/24 0656 02/15/24 0805  NA 125* 126* 128* 129* 132*  K 4.7 4.8 4.5 4.7 4.2  CL 80* 86* 88* 89* 88*  CO2 26  --  24 21* 24  GLUCOSE 530* 554* 277* 153* 177*  BUN 34* 33* 44* 59* 56*  CREATININE 8.70* 9.30* 9.60* 11.53* 10.56*  ALBUMIN 3.4*  --   --  3.4* 3.3*  CALCIUM 8.9  --  9.1 9.0 9.7  PHOS 4.7*  --   --  5.6* 5.0*  AST 17  --   --   --   --   ALT 18  --   --   --   --    Liver Function Tests: Recent Labs  Lab 02/11/24 1427 02/13/24 0656 02/15/24 0805  AST 17  --   --   ALT 18  --   --   ALKPHOS 137*  --   --   BILITOT 1.1  --   --   PROT 9.4*  --   --   ALBUMIN 3.4* 3.4* 3.3*   No results for input(s): "LIPASE", "AMYLASE" in the last 168 hours. No results for input(s): "AMMONIA" in the last 168 hours. CBC: Recent Labs  Lab 02/11/24 1427 02/11/24 1436 02/12/24 0626 02/13/24 0656 02/15/24 0805  WBC 13.6*  --  13.0* 10.9* 8.9  HGB 13.7   < > 13.0 13.2 13.8  HCT 43.0   < > 40.5 41.2 42.7  MCV 87.0  --  86.2 86.0 85.2  PLT 252  --  249 261 304   < > = values in this interval not displayed.   Cardiac Enzymes: No results for input(s): "CKTOTAL", "CKMB", "CKMBINDEX", "TROPONINI" in the last 168 hours. CBG: Recent Labs  Lab  02/14/24 1132 02/14/24 1548 02/14/24 2137 02/14/24 2249 02/15/24 0739  GLUCAP 272* 157* 68* 93 168*    Iron Studies: No results for input(s): "IRON", "TIBC", "TRANSFERRIN", "FERRITIN" in the last 72 hours. Studies/Results: No results found.  aspirin EC  81 mg Oral Daily   atorvastatin  40 mg Oral Daily   Chlorhexidine Gluconate Cloth  6 each Topical Q0600   cinacalcet  60 mg Oral Q breakfast   doxercalciferol  5 mcg Intravenous Q M,W,F-HD   heparin  5,000 Units Subcutaneous Q8H   insulin aspart  0-15 Units Subcutaneous TID WC   insulin aspart  0-5 Units Subcutaneous QHS   insulin aspart  5 Units Subcutaneous TID WC   insulin glargine-yfgn  38 Units Subcutaneous Daily   lidocaine-prilocaine   Topical Q M,W,F-HD   midodrine  15 mg Oral TID WC   mupirocin ointment  1 Application Nasal BID   pantoprazole  40 mg Oral Daily   sevelamer carbonate  4,000 mg Oral TID WC    BMET    Component Value Date/Time   NA 132 (L) 02/15/2024 0805   K 4.2 02/15/2024 0805   CL 88 (L) 02/15/2024 0805   CO2 24 02/15/2024 0805   GLUCOSE 177 (H) 02/15/2024 0805   BUN 56 (H) 02/15/2024 0805   CREATININE 10.56 (H) 02/15/2024 0805   CALCIUM 9.7 02/15/2024 0805   GFRNONAA 5 (L) 02/15/2024 0805   GFRAA 3 (L) 08/14/2019 2051   CBC    Component Value Date/Time   WBC 8.9 02/15/2024 0805   RBC 5.01 02/15/2024 0805   HGB 13.8 02/15/2024 0805   HGB 13.0 12/17/2022 1242   HCT 42.7 02/15/2024 0805   PLT 304 02/15/2024 0805   PLT 272 12/17/2022 1242   MCV 85.2 02/15/2024 0805   MCH 27.5 02/15/2024 0805   MCHC 32.3 02/15/2024 0805   RDW 16.5 (H) 02/15/2024 0805   LYMPHSABS 1.3 01/15/2024 1729   MONOABS 1.4 (H) 01/15/2024 1729   EOSABS 0.5 01/15/2024 1729   BASOSABS 0.1 01/15/2024 1729    Outpt HD orders:  MWF RKC 4:15 180 NR, BFR 450/DFR 700 EDW 99.5kg 2K/2Ca 3/26 post wt 99.8kg, 3/24 101.3 hectorol 5 qtx No heparin   Assessment/Plan **Sepsis:  presumed urinary source but cultures are  pending (negative to date) and he's on broad spectrum abx.     **ESRD on HD:  HD MWF. Has been off of his outpatient schedule due to issues with hypotension and now will high HD census, will plan for HD tomorrow (he had HD on 02/13/24) and eventually get back on his outpatient schedule.  Labs look ok and volume acceptable.  WIll continue midodrine 15mg  TID.   **Hyponatremia:  will correct with HD.    **Secondary hyperPTH: cont outpt VDRA IV with HD. Cont outpt binder.    **HFrEF:  EF 30-35%. Volume management with HD.  - does not appear to have clinically significant volume overload today.   **Anemia of CKD: Hb 13s, not active issue.   **DM with marked hyperglyemia on presentation:  BG improving with insulin here.   Irena Cords, MD [facility]

## 2024-02-15 NOTE — Evaluation (Signed)
 Occupational Therapy Evaluation Patient Details Name: [name] LUCKENBACH Sr. MRN: [id number] DOB: 02/24/1961 Today's Date: 02/15/2024   History of Present Illness   [name] [name]  is a 63 y.o. male,  with medical history significant of ESRD-HD (MWF), DM2, HTN, nonischemic cardiomyopathy, systolic CHF, prior right BKA with known history of orthostasis,.  -Patient recently secondary to complaints of dysuria, increased urinary frequency and urgency with dysuria, reports symptom has been ongoing for last 7 days, he saw his urgent care yesterday, where he was prescribed Cipro, and reported to provide urine sample for available from home, patient reports last dysuria was this morning, he still makes small amount of urine daily, has any fever, chills, he was prescribed ciprofloxacin by urgent care yesterday.  -In ED his workup was significant for hypotension, apparently he has not been taking his midodrine as instructed, this has resolved with fluid bolus and resuming midodrine, as well blood glucose was significantly elevated at 530, he received subcu insulin, In-N-Out was nonyielding with no urine could be obtained, [facility] hospitalist consulted to admit.     Clinical Impressions Pt agreeable to OT evaluation. Pt reported that he does not usually ambulate but uses the w/c. Today pt was able to stand without and without RW. Able to take L lateral steps to [facility] with RW. CGA to min A for sit to stand. B UE general weakness with min to mod A for lower body ADL's per observation and clinical judgement. Pt left in the bed with bed alarm set and call bell within reach. Pt will benefit from continued OT in the hospital and recommended venue below to increase strength, balance, and endurance for safe ADL's.        If plan is discharge home, recommend the following:   A little help with walking and/or transfers;A little help with bathing/dressing/bathroom;Assistance with cooking/housework;Assist for transportation;Help  with stairs or ramp for entrance     Functional Status Assessment   Patient has had a recent decline in their functional status and demonstrates the ability to make significant improvements in function in a reasonable and predictable amount of time.     Equipment Recommendations   None recommended by OT     Recommendations for Other Services         Precautions/Restrictions   Precautions Precautions: Fall Recall of Precautions/Restrictions: Intact Restrictions Weight Bearing Restrictions Per Provider Order: No     Mobility Bed Mobility Overal bed mobility: Needs Assistance Bed Mobility: Supine to Sit, Sit to Supine     Supine to sit: Contact guard Sit to supine: Contact guard assist   General bed mobility comments: slow labored movement    Transfers Overall transfer level: Needs assistance Equipment used: Rolling walker (2 wheels) Transfers: Sit to/from Stand Sit to Stand: Min assist, Contact guard assist           General transfer comment: with and without RW      Balance Overall balance assessment: Needs assistance Sitting-balance support: No upper extremity supported, Feet supported Sitting balance-Leahy Scale: Fair Sitting balance - Comments: fair to good seated at EOB   Standing balance support: No upper extremity supported, During functional activity Standing balance-Leahy Scale: Poor Standing balance comment: poor without RW; poor to fair with RW                           ADL either performed or assessed with clinical judgement   ADL Overall ADL's : Needs assistance/impaired  Grooming: Set up;Sitting   Upper Body Bathing: Set up;Sitting   Lower Body Bathing: Minimal assistance;Moderate assistance;Sitting/lateral leans   Upper Body Dressing : Set up;Sitting   Lower Body Dressing: Minimal assistance;Moderate assistance;Sitting/lateral leans Lower Body Dressing Details (indicate cue type and reason): Asssit to tie  shoes; able to don R prosthetic; assist to doff. Toilet Transfer: Contact guard assist;Minimal assistance;Rolling walker (2 wheels) Toilet Transfer Details (indicate cue type and reason): Simulated via sit to stand with RW and without; L lateral steps. Toileting- Clothing Manipulation and Hygiene: Contact guard assist;Sitting/lateral lean;Minimal assistance       Functional mobility during ADLs: Contact guard assist;Minimal assistance;Rolling walker (2 wheels)       Vision Baseline Vision/History: 1 Wears glasses Ability to See in Adequate Light: 1 Impaired Patient Visual Report: No change from baseline Vision Assessment?: No apparent visual deficits     Perception Perception: Not tested       Praxis Praxis: Not tested       Pertinent Vitals/Pain Pain Assessment Pain Assessment: No/denies pain     Extremity/Trunk Assessment Upper Extremity Assessment Upper Extremity Assessment: Generalized weakness   Lower Extremity Assessment Lower Extremity Assessment: Defer to PT evaluation   Cervical / Trunk Assessment Cervical / Trunk Assessment: Normal   Communication Communication Communication: No apparent difficulties;Other (comment) (Extended time to repond intermitently)   Cognition Arousal: Alert Behavior During Therapy: WFL for tasks assessed/performed Cognition: No apparent impairments                               Following commands: Intact       Cueing  General Comments   Cueing Techniques: Verbal cues;Tactile cues                 Home Living Family/patient expects to be discharged to:: Private residence Living Arrangements: Other relatives Available Help at Discharge: Family;Available PRN/intermittently Type of Home: House Home Access: Stairs to enter [employer] of Steps: 4 Entrance Stairs-Rails: Right;Left;Can reach both Home Layout: One level     Bathroom Shower/Tub: Producer, television/film/video: Handicapped  height Bathroom Accessibility: Yes   Home Equipment: Agricultural consultant (2 wheels);Wheelchair - manual;Shower seat;BSC/3in1;Grab bars - tub/shower   Additional Comments: uses Right BKA prosthetic leg      Prior Functioning/Environment Prior Level of Function : Needs assist       Physical Assist : ADLs (physical)   ADLs (physical): Dressing Mobility Comments: Today pt reported he only completes stand pivot transfers to w/c for mobility. ADLs Comments: Assist for tying shoes at  home. Pt reports independence with other ADL's.    OT Problem List: Decreased strength;Decreased activity tolerance;Impaired balance (sitting and/or standing)   OT Treatment/Interventions: Self-care/ADL training;Therapeutic exercise;Therapeutic activities;Balance training;Patient/family education;DME and/or AE instruction      OT Goals(Current goals can be found in the care plan section)   Acute Rehab OT Goals Patient Stated Goal: return home OT Goal Formulation: With patient Time For Goal Achievement: 02/29/24 Potential to Achieve Goals: Fair   OT Frequency:  Min 2X/week                                   End of Session Equipment Utilized During Treatment: Rolling walker (2 wheels);Gait belt  Activity Tolerance: Patient tolerated treatment well Patient left: in bed;with call bell/phone within reach;with bed alarm set  OT Visit Diagnosis:  Unsteadiness on feet (R26.81);Other abnormalities of gait and mobility (R26.89);Muscle weakness (generalized) (M62.81)                Time: 1610-9604 OT Time Calculation (min): 24 min Charges:  OT General Charges $OT Visit: 1 Visit OT Evaluation $OT Eval Low Complexity: 1 Low  Britaney Espaillat OT, MOT  Danie Chandler 02/15/2024, 2:16 PM

## 2024-02-15 NOTE — Progress Notes (Signed)
  HEMODIALYSIS TREATMENT NOTE:  Unable to tolerate removal of 1-2 liters as ordered.  UF was limited by hypotension despite Midodrine, Albumin 25g x2, and cooled dialysate.  Net UF 500 ml.  3.5 hour treatment completed.  All blood was returned.   Post-HD:  02/15/24 2055  Vital Signs  Temp 98.7 F (37.1 C)  Temp Source Oral  Pulse Rate 76  Pulse Rate Source Monitor  Resp 17  BP 117/69  BP Location Right Arm  BP Method Automatic  Patient Position (if appropriate) Lying  Oxygen Therapy  SpO2 100 %  O2 Device Room Air  Pain Assessment  Pain Score 0  Dialysis Weight  Weight 100.7 kg  Type of Weight Post-Dialysis  Post Treatment  Dialyzer Clearance Heavily streaked  Hemodialysis Intake (mL) 400 mL  Liters Processed 64.3  Fluid Removed (mL) 500 mL  Tolerated HD Treatment No (Comment)  Post-Hemodialysis Comments UF was limited by low BP  AVG/AVF Arterial Site Held (minutes) 7 minutes  AVG/AVF Venous Site Held (minutes) 7 minutes  Fistula / Graft Left Upper arm Arteriovenous fistula  Placement Date/Time: 09/22/15 0114   Placed prior to admission: Yes  Orientation: (c) Left  Access Location: Upper arm  Access Type: (c) Arteriovenous fistula  Fistula / Graft Assessment Thrill;Bruit  Status Patent    Pt was transported back to 320.  He asked to use bedside commode but felt dizzy upon sitting.  BP 123/75, HR75, spO2 100%, glucose 107.  Pt was assisted onto bedpan with call bell in reach.  Arman Filter, RN [facility]

## 2024-02-15 NOTE — TOC Progression Note (Signed)
 Transition of Care (TOC) - Progression Note    Patient Details  Name: [name] ORNDOFF Sr. MRN: [id number] Date of Birth: 12/26/1960  [facility]) CM/SW Contact  Karn Cassis, [place] Phone Number: 02/15/2024, 12:51 PM  Clinical Narrative:  LCSW discussed d/c plan with pt again. He states he will return home and his brother will be able to assist him. He is not interested in SNF. Pt indicates he was able to get into wheelchair on his own today. Discussed home health and pt said he has had someone coming in but he isn't sure what agency. LCSW will determine if pt is active and refer if not.       Expected Discharge Plan: Home/Self Care Barriers to Discharge: Continued Medical Work up  Expected Discharge Plan and Services In-house Referral: Clinical Social Work Discharge Planning Services: CM Consult                                           Social Determinants of Health (SDOH) Interventions SDOH Screenings   Food Insecurity: No Food Insecurity (02/11/2024)  Housing: Low Risk  (02/11/2024)  Transportation Needs: No Transportation Needs (02/11/2024)  Utilities: Not At Risk (02/11/2024)  Alcohol Screen: Low Risk  (02/04/2022)  Depression (PHQ2-9): Low Risk  (08/06/2022)  Tobacco Use: Low Risk  (02/14/2024)    Readmission Risk Interventions    02/11/2024    9:09 PM 11/05/2022    3:08 PM  Readmission Risk Prevention Plan  Transportation Screening Complete Complete  Medication Review Oceanographer) Complete Complete  PCP or Specialist appointment within 3-5 days of discharge Complete Complete  HRI or Home Care Consult Complete Complete  SW Recovery Care/Counseling Consult  Complete  Palliative Care Screening Not Applicable Complete  Skilled Nursing Facility Complete Complete

## 2024-02-15 NOTE — Progress Notes (Signed)
 [facility] PROGRESS NOTE  [name] FONTAINE Sr. (DOB: 1961/03/22) WUJ:[id number] PCP: Pcp, No  Brief Narrative: [name] Lance Sr. is a 63 y.o. male with a history of ESRD (HD on MWF), IDT2DM, HTN, HFrEF/NICM, PAD s/p right BKA, and orthostatic hypotension who presented to the ED 3/27 with dysuria, increased urinary frequency and urgency with dysuria, reports symptom has been ongoing for last 7 days. He had been prescribed cipro at urgent care the previous day but never got it. In the ED he was hyperglycemic and hypotensive (having not taken midodrine home medication) which improved with IV fluids initially. He was given insulin and midodrine, admitted to SDU, later developed fever so antibiotics initially given were broadened and cultures drawn.   Subjective: No complaints this morning, no fevers or urinary complaints.   Objective: BP 113/70   Pulse 65   Temp 98.1 F (36.7 C) (Oral)   Resp 18   Ht 6\' 1"  (1.854 m)   Wt 99.8 kg   SpO2 96%   BMI 29.03 kg/m   Gen: No distress Pulm: Clear, nonlabored  CV: RRR with stable systolic murmur, trace edema GI: Soft, NT, ND, +BS  Neuro: Alert and oriented. No new focal deficits. Ext: Warm, dry, right BKA, LUA AVF +thrill Skin: No new rashes, lesions or ulcers on visualized skin   Assessment & Plan: Hypotension: Concern for sepsis with leukocytosis, fever, urinary source. Fortunately he's relatively asymptomatic and lactic acid is normal.  - BPs overall improving, will continue midodrine 15mg  TID  - Albumin prn particularly helpful at dialysis.  - Abx as below.  - Hgb stable, normal - Has stabilized, will transfer to floor.  UTI:  - Blood cultures NGTD, urine culture no growth taken after abx. Superficial culture not likely to be reliable. Given his clinical stability, will continue current plan of care. Should be able to complete antibiotics with last dose at dialysis tmrw.   IDT2DM: Uncontrolled with hyperglycemia. Severely elevated  CBG initially though no ketoacidosis. Previous HbA1c >15.5% consistent with average blood sugar over 400mg /dl though recheck here was 8.4%.   - Glycemic control improved at goal, we converted 70/30 to basal-bolus insulin AC/HS. His home regimen should improve glycemic control if taken as prescribed.   ESRD:  - Hyponatremia and volume status to be managed with HD.    CAD, HLD, s/p right BKA: No chest pain.  - Continue aspirin, statin.   Obesity: Body mass index is 29.03 kg/m.   [name] Nine, MD [facility] [email or web address] 02/15/2024, 10:11 AM

## 2024-02-15 NOTE — Plan of Care (Signed)
  Problem: Acute Rehab OT Goals (only OT should resolve) Goal: Pt. Will Perform Lower Body Bathing Flowsheets (Taken 02/15/2024 1418) Pt Will Perform Lower Body Bathing: with modified independence Goal: Pt. Will Perform Lower Body Dressing Flowsheets (Taken 02/15/2024 1418) Pt Will Perform Lower Body Dressing: with modified independence Goal: Pt. Will Transfer To Toilet Flowsheets (Taken 02/15/2024 1418) Pt Will Transfer to Toilet:  with modified independence  stand pivot transfer Goal: Pt. Will Perform Toileting-Clothing Manipulation Flowsheets (Taken 02/15/2024 1418) Pt Will Perform Toileting - Clothing Manipulation and hygiene: with modified independence Goal: Pt/Caregiver Will Perform Home Exercise Program Flowsheets (Taken 02/15/2024 1418) Pt/caregiver will Perform Home Exercise Program:  Increased strength  Both right and left upper extremity  Independently  Trayven Lumadue OT, MOT

## 2024-02-16 ENCOUNTER — Ambulatory Visit (HOSPITAL_COMMUNITY)
Admission: RE | Admit: 2024-02-16 | Discharge: 2024-02-16 | Disposition: A | Discharge status: Home / Self Care | Source: Ambulatory Visit | Attending: Nephrology | Admitting: Nephrology

## 2024-02-16 DIAGNOSIS — E1165 Type 2 diabetes mellitus with hyperglycemia: Secondary | ICD-10-CM | POA: Diagnosis not present

## 2024-02-16 DIAGNOSIS — I2510 Atherosclerotic heart disease of native coronary artery without angina pectoris: Secondary | ICD-10-CM | POA: Insufficient documentation

## 2024-02-16 DIAGNOSIS — Z992 Dependence on renal dialysis: Secondary | ICD-10-CM | POA: Insufficient documentation

## 2024-02-16 DIAGNOSIS — E1122 Type 2 diabetes mellitus with diabetic chronic kidney disease: Secondary | ICD-10-CM | POA: Insufficient documentation

## 2024-02-16 DIAGNOSIS — I132 Hypertensive heart and chronic kidney disease with heart failure and with stage 5 chronic kidney disease, or end stage renal disease: Secondary | ICD-10-CM | POA: Insufficient documentation

## 2024-02-16 DIAGNOSIS — I5022 Chronic systolic (congestive) heart failure: Secondary | ICD-10-CM | POA: Insufficient documentation

## 2024-02-16 DIAGNOSIS — Z794 Long term (current) use of insulin: Secondary | ICD-10-CM | POA: Insufficient documentation

## 2024-02-16 DIAGNOSIS — N186 End stage renal disease: Secondary | ICD-10-CM | POA: Insufficient documentation

## 2024-02-16 DIAGNOSIS — I428 Other cardiomyopathies: Secondary | ICD-10-CM | POA: Insufficient documentation

## 2024-02-16 HISTORY — PX: IR FLUORO GUIDE CV LINE LEFT: IMG2282

## 2024-02-16 HISTORY — PX: IR US GUIDE VASC ACCESS LEFT: IMG2389

## 2024-02-16 LAB — GLUCOSE, CAPILLARY
Glucose-Capillary: 142 mg/dL — ABNORMAL HIGH (ref 70–99)
Glucose-Capillary: 142 mg/dL — ABNORMAL HIGH (ref 70–99)
Glucose-Capillary: 142 mg/dL — ABNORMAL HIGH (ref 70–99)

## 2024-02-16 MED ORDER — LIDOCAINE-EPINEPHRINE 1 %-1:100000 IJ SOLN
INTRAMUSCULAR | Status: AC
  Filled 2024-02-16: qty 1

## 2024-02-16 MED ORDER — SODIUM CHLORIDE 0.9 % IV SOLN
20.0000 ug | Freq: Once | INTRAVENOUS | Status: AC
  Administered 2024-02-16: 20 ug via INTRAVENOUS
  Filled 2024-02-16: qty 5

## 2024-02-16 MED ORDER — CEFAZOLIN SODIUM-DEXTROSE 2-4 GM/100ML-% IV SOLN
INTRAVENOUS | Status: AC | PRN
  Administered 2024-02-16: 2 g via INTRAVENOUS

## 2024-02-16 MED ORDER — IOHEXOL 300 MG/ML  SOLN: 100.0000 mL | Freq: Once | INTRAMUSCULAR | Status: DC | PRN

## 2024-02-16 MED ORDER — MIDAZOLAM HCL 2 MG/2ML IJ SOLN
INTRAMUSCULAR | Status: AC | PRN
  Administered 2024-02-16: 1 mg via INTRAVENOUS

## 2024-02-16 MED ORDER — LIDOCAINE-EPINEPHRINE 1 %-1:100000 IJ SOLN: 20.0000 mL | Freq: Once | INTRAMUSCULAR | Status: DC

## 2024-02-16 MED ORDER — FENTANYL CITRATE (PF) 100 MCG/2ML IJ SOLN
INTRAMUSCULAR | Status: AC
  Filled 2024-02-16: qty 2

## 2024-02-16 MED ORDER — FENTANYL CITRATE (PF) 100 MCG/2ML IJ SOLN
INTRAMUSCULAR | Status: AC | PRN
  Administered 2024-02-16 (×2): 25 ug via INTRAVENOUS

## 2024-02-16 MED ORDER — MIDAZOLAM HCL 2 MG/2ML IJ SOLN
INTRAMUSCULAR | Status: AC
  Filled 2024-02-16: qty 2

## 2024-02-16 MED ORDER — CEFAZOLIN SODIUM-DEXTROSE 2-4 GM/100ML-% IV SOLN
INTRAVENOUS | Status: AC
  Filled 2024-02-16: qty 100

## 2024-02-16 MED ORDER — HEPARIN SODIUM (PORCINE) 1000 UNIT/ML IJ SOLN
INTRAMUSCULAR | Status: AC
  Filled 2024-02-16: qty 10

## 2024-02-16 NOTE — Progress Notes (Signed)
 [facility] PROGRESS NOTE  EBON KETCHUM Sr. (DOB: February 11, 1961) ZOX:[id number] PCP: Pcp, No  Brief Narrative: [name] is a 63 y.o. male with a history of ESRD (HD on MWF), IDT2DM, HTN, HFrEF/NICM, PAD s/p right BKA, and orthostatic hypotension who presented to the ED 3/27 with dysuria, increased urinary frequency and urgency with dysuria, reports symptom has been ongoing for last 7 days. He had been prescribed cipro at urgent care the previous day but never got it. In the ED he was hyperglycemic and hypotensive (having not taken midodrine home medication) which improved with IV fluids initially. He was given insulin and midodrine, admitted to SDU, later developed fever so antibiotics initially given were broadened and cultures drawn. He has stabilized, subsequently transferred to the floor 3/31. On 4/1 there is suspicion that AVF has clotted, IR consulted.   Subjective: No new complaints, no fever or chills or pain. We speak with his brother on facetime.  Objective: BP (!) 131/96   Pulse 74   Temp 99 F (37.2 C) (Oral)   Resp 16   Ht 6\' 1"  (1.854 m)   Wt 100.7 kg   SpO2 97%   BMI 29.29 kg/m   Gen: No distress Pulm: Clear, nonlabored  CV: RRR, no MRG or pitting edema GI: Soft, NT, ND, +BS  Neuro: Alert and oriented. No new focal deficits. Ext: Warm, dry R BKA. AVF in LUA has pulsatility with less thrill, no bruit. Distal extremity is warm, NVI. Skin: No new rashes, lesions or ulcers on visualized skin   Assessment & Plan: Hypotension: Concern for sepsis with leukocytosis, fever, urinary source. Fortunately he's relatively asymptomatic and lactic acid is normal.  - BPs overall improving, will continue midodrine 15mg  TID  - Albumin prn particularly helpful at dialysis.  - Abx as below.  - Hgb stable, normal   UTI:  - Blood cultures NGTD, urine culture no growth taken after abx. Superficial culture not likely to be reliable. Given his clinical stability, will continue  current plan of care with vancomycin with dialysis and cefepime daily  IDT2DM: Uncontrolled with hyperglycemia. Severely elevated CBG initially though no ketoacidosis. Previous HbA1c >15.5% consistent with average blood sugar over 400mg /dl though recheck here was 8.4%.   - Glycemic control improved at goal, we converted 70/30 to basal-bolus insulin AC/HS. His home regimen should improve glycemic control if taken as prescribed.   AVF clot:  - IR consulted for declot vs. TDC placement.  ESRD:  - Hyponatremia and volume status to be managed with HD.   - Needs IR declotting and continued MWF HD planned for tomorrow.  CAD, HLD, s/p right BKA: No chest pain.  - Continue aspirin, statin.   Obesity: Body mass index is 29.29 kg/m.   Tyrone Nine, MD [facility] [email or web address] 02/16/2024, 12:19 PM

## 2024-02-16 NOTE — TOC Progression Note (Signed)
 Transition of Care (TOC) - Progression Note    Patient Details  Name: [name] FRIEDE Sr. MRN: [id number] Date of Birth: Mar 25, 1961  Transition of Care [facility]) CM/SW Contact  Karn Cassis, [place] Phone Number: 02/16/2024, 10:21 AM  Clinical Narrative: LCSW confirmed pt is active with [facility] HHPT/OT. Will need resumption orders at d/c. MD aware. TOC will follow.       Expected Discharge Plan: Home/Self Care Barriers to Discharge: Continued Medical Work up  Expected Discharge Plan and Services In-house Referral: Clinical Social Work Discharge Planning Services: CM Consult                               HH Arranged: PT, OT HH Agency: [facility] Date HH Agency Contacted: 02/16/24 Time HH Agency Contacted: [phone] Representative spoke with at [facility] Agency: Adele Dan   Social Determinants of Health (SDOH) Interventions SDOH Screenings   Food Insecurity: No Food Insecurity (02/11/2024)  Housing: Low Risk  (02/11/2024)  Transportation Needs: No Transportation Needs (02/11/2024)  Utilities: Not At Risk (02/11/2024)  Alcohol Screen: Low Risk  (02/04/2022)  Depression (PHQ2-9): Low Risk  (08/06/2022)  Tobacco Use: Low Risk  (02/14/2024)    Readmission Risk Interventions    02/11/2024    9:09 PM 11/05/2022    3:08 PM  Readmission Risk Prevention Plan  Transportation Screening Complete Complete  Medication Review (RN Care Manager) Complete Complete  PCP or Specialist appointment within 3-5 days of discharge Complete Complete  HRI or Home Care Consult Complete Complete  SW Recovery Care/Counseling Consult  Complete  Palliative Care Screening Not Applicable Complete  Skilled Nursing Facility Complete Complete

## 2024-02-16 NOTE — Procedures (Signed)
 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  ALSO, ALREADY HAS AN OCCLUDED OUTFLOW CEPHALIC STENT MORE PROXIMALLY.  THEREFORE, NOT A CANDIDATE FOR FURTHER INTERVENTION.   SUCCESSFUL LEFT EJ HD CATH WITH TIP SVCRA  NEEDS VAS SURGERY REFERRAL FOR NEW ACCESS ASSESSMENT    Sharen Counter, MD

## 2024-02-16 NOTE — Progress Notes (Signed)
 Patient ID: [name] Lance Sr., male   DOB: 06/05/1961, 63 y.o.   MRN: [id number] S: No new complaints O:BP (!) 131/96   Pulse 74   Temp 99 F (37.2 C) (Oral)   Resp 16   Ht 6\' 1"  (1.854 m)   Wt 100.7 kg   SpO2 97%   BMI 29.29 kg/m   Intake/Output Summary (Last 24 hours) at 02/16/2024 1002 Last data filed at 02/15/2024 2055 Gross per 24 hour  Intake --  Output 880 ml  Net -880 ml   Intake/Output: I/O last 3 completed shifts: In: -  Out: 880 [Emesis/NG output:380; Other:500]  Intake/Output this shift:  No intake/output data recorded. Weight change:  Gen: NAD CVS: RRR Resp:CTA Abd: +BS, soft, NT/ND Ext: no edema, s/p RBKA, LUE AVF pulsatile without thrill or bruit  Recent Labs  Lab 02/11/24 1427 02/11/24 1436 02/12/24 0626 02/13/24 0656 02/15/24 0805  NA 125* 126* 128* 129* 132*  K 4.7 4.8 4.5 4.7 4.2  CL 80* 86* 88* 89* 88*  CO2 26  --  24 21* 24  GLUCOSE 530* 554* 277* 153* 177*  BUN 34* 33* 44* 59* 56*  CREATININE 8.70* 9.30* 9.60* 11.53* 10.56*  ALBUMIN 3.4*  --   --  3.4* 3.3*  CALCIUM 8.9  --  9.1 9.0 9.7  PHOS 4.7*  --   --  5.6* 5.0*  AST 17  --   --   --   --   ALT 18  --   --   --   --    Liver Function Tests: Recent Labs  Lab 02/11/24 1427 02/13/24 0656 02/15/24 0805  AST 17  --   --   ALT 18  --   --   ALKPHOS 137*  --   --   BILITOT 1.1  --   --   PROT 9.4*  --   --   ALBUMIN 3.4* 3.4* 3.3*   No results for input(s): "LIPASE", "AMYLASE" in the last 168 hours. No results for input(s): "AMMONIA" in the last 168 hours. CBC: Recent Labs  Lab 02/11/24 1427 02/11/24 1436 02/12/24 0626 02/13/24 0656 02/15/24 0805  WBC 13.6*  --  13.0* 10.9* 8.9  HGB 13.7   < > 13.0 13.2 13.8  HCT 43.0   < > 40.5 41.2 42.7  MCV 87.0  --  86.2 86.0 85.2  PLT 252  --  249 261 304   < > = values in this interval not displayed.   Cardiac Enzymes: No results for input(s): "CKTOTAL", "CKMB", "CKMBINDEX", "TROPONINI" in the last 168 hours. CBG: Recent Labs   Lab 02/15/24 0739 02/15/24 1121 02/15/24 1625 02/15/24 2137 02/16/24 0723  GLUCAP 168* 213* 179* 107* 142*    Iron Studies: No results for input(s): "IRON", "TIBC", "TRANSFERRIN", "FERRITIN" in the last 72 hours. Studies/Results: No results found.  aspirin EC  81 mg Oral Daily   atorvastatin  40 mg Oral Daily   Chlorhexidine Gluconate Cloth  6 each Topical Q0600   cinacalcet  60 mg Oral Q breakfast   doxercalciferol  5 mcg Intravenous Q M,W,F-HD   heparin  5,000 Units Subcutaneous Q8H   insulin aspart  0-15 Units Subcutaneous TID WC   insulin aspart  0-5 Units Subcutaneous QHS   insulin aspart  5 Units Subcutaneous TID WC   insulin glargine-yfgn  38 Units Subcutaneous Daily   lidocaine-prilocaine   Topical Q M,W,F-HD   midodrine  15 mg Oral TID WC  pantoprazole  40 mg Oral Daily   sevelamer carbonate  4,000 mg Oral TID WC    BMET    Component Value Date/Time   NA 132 (L) 02/15/2024 0805   K 4.2 02/15/2024 0805   CL 88 (L) 02/15/2024 0805   CO2 24 02/15/2024 0805   GLUCOSE 177 (H) 02/15/2024 0805   BUN 56 (H) 02/15/2024 0805   CREATININE 10.56 (H) 02/15/2024 0805   CALCIUM 9.7 02/15/2024 0805   GFRNONAA 5 (L) 02/15/2024 0805   GFRAA 3 (L) 08/14/2019 2051   CBC    Component Value Date/Time   WBC 8.9 02/15/2024 0805   RBC 5.01 02/15/2024 0805   HGB 13.8 02/15/2024 0805   HGB 13.0 12/17/2022 1242   HCT 42.7 02/15/2024 0805   PLT 304 02/15/2024 0805   PLT 272 12/17/2022 1242   MCV 85.2 02/15/2024 0805   MCH 27.5 02/15/2024 0805   MCHC 32.3 02/15/2024 0805   RDW 16.5 (H) 02/15/2024 0805   LYMPHSABS 1.3 01/15/2024 1729   MONOABS 1.4 (H) 01/15/2024 1729   EOSABS 0.5 01/15/2024 1729   BASOSABS 0.1 01/15/2024 1729    Outpt HD orders:  MWF [facility] 4:15 180 NR, BFR 450/DFR 700 EDW 99.5kg 2K/2Ca 3/26 post wt 99.8kg, 3/24 101.3 hectorol 5 qtx No heparin   Assessment/Plan Vascular access - his LUE AVF is now pulsatile without bruit.  Likely clotted overnight  following dialysis.  Will consult IR for possible declot vs TDC placement.  He will need to have this done before discharge or try to add on with [facility]. Sepsis:  presumed urinary source but cultures are pending (negative to date) and he's on broad spectrum abx.   ESRD on HD:  HD MWF. Has been off of his outpatient schedule due to issues with hypotension but had HD yesterday.  Will plan for HD tomorrow after his declot or new TDC placement.  Labs look ok and volume acceptable.  WIll continue midodrine 15mg  TID. Hyponatremia:  will correct with HD.  Secondary hyperPTH: cont outpt VDRA IV with HD. Cont outpt binder.  HFrEF:  EF 30-35%. Volume management with HD.  - does not appear to have clinically significant volume overload today. Anemia of CKD: Hb 13s, not active issue. DM with marked hyperglyemia on presentation:  BG improving with insulin here.   Irena Cords, MD [employer] [phone]

## 2024-02-16 NOTE — Consult Note (Signed)
 Chief Complaint: Patient was seen in consultation today for vascular access problem, with consideration for fistulogram with possible intervention.  Referring Provider(s): Dr. Baruch Gouty, MD  Supervising Physician: Ruel Favors  Patient Status: [facility] - In-pt  Patient is Full Code  History of Present Illness: [name] HASLER Sr. is a 63 y.o. male with PMHx notable for HTN, CAD, heart failure, non-ischemic cardiomyopathy, ESRD on HD (MWF), and T2DM.  Patient presented to [facility] ED on 3/26, reporting 6 days of hematuria. EMS reported he was hypotensive. History of diabetes and ESRD-HD. Poor compliance with insulin. He was admitted for uncontrolled hyperglycemia. He underwent HD yesterday, and was able to complete treatment. However, his fistula appears partially occluded today, with concerns for clot formation overnight s/p dialysis.   Interventional Radiology was requested for fistulogram with possible declot, possible TDC placement, or other intervention. Patient is scheduled for same in IR at [facility] today, and will return to [facility] subsequent to intervention.   All labs and medications are within acceptable parameters. No pertinent allergies. Patient has been NPO since midnight.    Currently without any significant complaints. Patient alert and laying in bed,calm. Denies any fevers, headache, chest pain, SOB, cough, abdominal pain, nausea, vomiting or bleeding.     Past Medical History:  Diagnosis Date   ESRD on hemodialysis (HCC)    a. HD MWF > 10 years.   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 01/2022 Echo: EF 45-50%, glob HK, mod LVH, mildly reduced RV fxn, mild MR, mod-sev MV Ca2+. Mild AS; b. 10/2023 Echo: EF 45-50%, glob HK, GrII DD, mildly reduced RV fxn, RVSP 52.3mmHg. Mild MR/MS, mod TR.   Hypertension    Nonischemic cardiomyopathy (HCC)    a. 2019 Cath [facility]): Nonobstructive disease; b. 12/2019 Echo: EF 50%;  c. 12/2019 MV (Tx w/u [facility]): Ant infarct w/ peri-infarct ischemia. EF 35%; c. 05/2021 MV (Tx w/u [facility]): Cor Ca2+, EF 53%, motion artifact, equiv study, likely low risk.   Nonobstructive coronary artery disease    a.  2019 Cath ([place]): Left main normal, LAD 30 proximal, RCA 30 distal, EF 25%.   Obesity    Type 2 diabetes mellitus (HCC)    a. 10/2023 - admission w/ [facility]HS.    Past Surgical History:  Procedure Laterality Date   A/V FISTULAGRAM N/A 08/15/2019   Procedure: A/V FISTULAGRAM;  Surgeon: Maeola Harman, MD;  Location: [facility] INVASIVE CV LAB;  Service: Cardiovascular;  Laterality: N/A;   A/V FISTULAGRAM N/A 01/21/2024   Procedure: A/V Fistulagram;  Surgeon: Ethelene Hal, MD;  Location: [facility] INVASIVE CV LAB;  Service: Cardiovascular;  Laterality: N/A;   AMPUTATION Right 02/02/2022   Procedure: AMPUTATION 5th RAY;  Surgeon: Louann Sjogren, MD;  Location: [facility] OR;  Service: Podiatry;  Laterality: Right;   AMPUTATION Right 05/23/2022   Procedure: RIGHT BELOW KNEE AMPUTATION;  Surgeon: Nadara Mustard, MD;  Location: [facility] OR;  Service: Orthopedics;  Laterality: Right;   AMPUTATION Right 07/04/2022   Procedure: REVISION AMPUTATION BELOW KNEE;  Surgeon: Nadara Mustard, MD;  Location: [facility] OR;  Service: Orthopedics;  Laterality: Right;   APPLICATION OF WOUND VAC  05/23/2022   Procedure: APPLICATION OF WOUND VAC;  Surgeon: Nadara Mustard, MD;  Location: [name] OR;  Service: Orthopedics;;   AV FISTULA PLACEMENT     CARDIAC CATHETERIZATION  02/17/2018   LHC 02/17/18 ([name]): 30% pLAD, 30% dRCA, LVEDP 25-30,  LVEF 25%.     CATARACT EXTRACTION W/PHACO Right 03/29/2020   Procedure: CATARACT EXTRACTION PHACO AND INTRAOCULAR LENS PLACEMENT (IOC)  RIGHT DIABETIC;  Surgeon: Lockie Mola, MD;  Location: [facility] ORS;  Service: Ophthalmology;  Laterality: Right;  Lot # [id number] H [place]: 02:30.9 AP% 9.5% CDE: 14.38   EYE SURGERY Right    cataract removed   [name] N/A 12/02/2023    Procedure: [name];  Surgeon: Imogene Burn, MD;  Location: [facility] ENDOSCOPY;  Service: Gastroenterology;  Laterality: N/A;   I & D EXTREMITY Right 02/02/2022   Procedure: IRRIGATION AND DEBRIDEMENT RIGHT FOOT;  Surgeon: Louann Sjogren, MD;  Location: [facility] OR;  Service: Podiatry;  Laterality: Right;   INCISION AND DRAINAGE OF WOUND Right 02/08/2022   Procedure: IRRIGATION AND DEBRIDEMENT WOUND;  Surgeon: Asencion Islam, DPM;  Location: [facility] OR;  Service: Podiatry;  Laterality: Right;  with removal of infected bone    INSERTION OF DIALYSIS CATHETER  08/13/2019   Procedure: Insertion Of Dialysis Catheter;  Surgeon: Larina Earthly, MD;  Location: [facility] OR;  Service: Vascular;;   INSERTION OF DIALYSIS CATHETER Left 07/19/2020   Procedure: INSERTION OF DIALYSIS CATHETER;  Surgeon: Maeola Harman, MD;  Location: [facility] OR;  Service: Vascular;  Laterality: Left;   IR LUMBAR PUNCTURE  11/20/2023   IR US GUIDE BX ASP/DRAIN  11/07/2022   IRRIGATION AND DEBRIDEMENT ABSCESS     PERIPHERAL VASCULAR BALLOON ANGIOPLASTY  08/15/2019   Procedure: PERIPHERAL VASCULAR BALLOON ANGIOPLASTY;  Surgeon: Maeola Harman, MD;  Location: [facility] INVASIVE CV LAB;  Service: Cardiovascular;;   PERIPHERAL VASCULAR BALLOON ANGIOPLASTY Left 02/06/2022   Procedure: PERIPHERAL VASCULAR BALLOON ANGIOPLASTY;  Surgeon: Cephus Shelling, MD;  Location: [facility] INVASIVE CV LAB;  Service: Cardiovascular;  Laterality: Left;   PERIPHERAL VASCULAR BALLOON ANGIOPLASTY  01/21/2024   Procedure: PERIPHERAL VASCULAR BALLOON ANGIOPLASTY;  Surgeon: Ethelene Hal, MD;  Location: [facility] INVASIVE CV LAB;  Service: Cardiovascular;;  Cephalic Arch   RADIOLOGY WITH ANESTHESIA N/A 11/20/2023   Procedure: MRI WITH ANESTHESIA;  Surgeon: Radiologist, Medication, MD;  Location: [facility] OR;  Service: Radiology;  Laterality: N/A;   RADIOLOGY WITH ANESTHESIA N/A 11/20/2023   Procedure: IR WITH ANESTHESIA;  Surgeon: Gilmer Mor, DO;  Location: [facility] OR;  Service:  Anesthesiology;  Laterality: N/A;  PLEASE OBTAIN OPENING PRESSURE AND DRAIN AT LEAST 30CC'S OF CSF.   REVISON OF ARTERIOVENOUS FISTULA Left 07/19/2020   Procedure: REVISON/PLICATION OF ARTERIOVENOUS FISTULA LEFT;  Surgeon: Maeola Harman, MD;  Location: [facility] OR;  Service: Vascular;  Laterality: Left;   THROMBECTOMY AND REVISION OF ARTERIOVENTOUS (AV) GORETEX  GRAFT Left 08/13/2019   Procedure: THROMBECTOMY OF LEFT ARM ARTERIOVENTOUS (AV) FISTULA;  Surgeon: Larina Earthly, MD;  Location: [facility] OR;  Service: Vascular;  Laterality: Left;   TOE AMPUTATION      Allergies: Sulfa antibiotics  Medications: Prior to Admission medications   Medication Sig Start Date End Date Taking? Authorizing Provider  acetaminophen (TYLENOL) 500 MG tablet Take 1,000 mg by mouth 2 (two) times daily as needed for moderate pain (pain score 4-6), headache or fever.   Yes [provider]  aspirin EC 81 MG tablet Take 1 tablet (81 mg total) by mouth daily. Swallow whole. 01/13/24  Yes Osvaldo Shipper, MD  atorvastatin (LIPITOR) 40 MG tablet Take 1 tablet (40 mg total) by mouth daily. 01/13/24 02/12/24 Yes Osvaldo Shipper, MD  B Complex-C-Folic Acid (DIALYVITE 800) 0.8 MG TABS Take 1 tablet by mouth daily.  Yes [provider]  cinacalcet (SENSIPAR) 60 MG tablet Take 60 mg by mouth daily. 02/03/24  Yes [provider]  ciprofloxacin (CIPRO) 500 MG tablet Take 1 tablet (500 mg total) by mouth 2 (two) times daily. 02/10/24  Yes Particia Nearing, PA-C  insulin isophane & regular human KwikPen (NOVOLIN 70/30 KWIKPEN) (70-30) 100 UNIT/ML KwikPen Inject 30U in the morning and 25U in the evening 10/27/23  Yes Briant Cedar, MD  lidocaine-prilocaine (EMLA) cream Apply 1 Application topically every Monday, Wednesday, and Friday with hemodialysis.   Yes [provider]  midodrine (PROAMATINE) 5 MG tablet Take 2 tablets (10 mg total) by mouth 3 (three) times daily with meals. 01/13/24  04/12/24 Yes Osvaldo Shipper, MD  pantoprazole (PROTONIX) 40 MG tablet Take 1 tablet (40 mg total) by mouth 2 (two) times daily. 01/13/24 04/12/24 Yes Osvaldo Shipper, MD  sevelamer carbonate (RENVELA) 800 MG tablet Take 2,400-5,600 mg by mouth See admin instructions. Take 7 tablets (4000mg ) three times daily with meals and 3 tablets (2400mg ) twice daily with snacks.   Yes [provider]  Insulin Pen Needle 32G X 4 MM MISC Use with insulin pen. 10/28/23   Briant Cedar, MD     Family History  Problem Relation Age of Onset   Hypertension Mother     Social History   Socioeconomic History   Marital status: Divorced    Spouse name: Not on file   Number of children: Not on file   Years of education: Not on file   Highest education level: Not on file  Occupational History   Not on file  Tobacco Use   Smoking status: Never   Smokeless tobacco: Never  Vaping Use   Vaping status: Never Used  Substance and Sexual Activity   Alcohol use: Yes   Drug use: Never   Sexual activity: Not on file  Other Topics Concern   Not on file  Social History Narrative   Lives locally.  Does not routinely exercise.   Social Drivers of Corporate investment banker Strain: Not on file  Food Insecurity: No Food Insecurity (02/11/2024)   Hunger Vital Sign    Worried About Running Out of Food in the Last Year: Never true    Ran Out of Food in the Last Year: Never true  Transportation Needs: No Transportation Needs (02/11/2024)   PRAPARE - Administrator, Civil Service (Medical): No    Lack of Transportation (Non-Medical): No  Physical Activity: Not on file  Stress: Not on file  Social Connections: Not on file     Review of Systems: A 12 point ROS discussed and pertinent positives are indicated in the HPI above.  All other systems are negative.  Vital Signs: BP (!) 131/96   Pulse 74   Temp 99 F (37.2 C) (Oral)   Resp 16   Ht 6\' 1"  (1.854 m)   Wt 222 lb 0.1 oz (100.7  kg)   SpO2 97%   BMI 29.29 kg/m   Advance Care Plan: The advanced care place/surrogate decision maker was discussed at the time of visit and the patient did not wish to discuss or was not able to name a surrogate decision maker or provide an advance care plan.  Physical Exam Vitals reviewed.  Constitutional:      General: He is not in acute distress.    Appearance: Normal appearance. He is obese. He is not ill-appearing.  HENT:     Mouth/Throat:  Mouth: Mucous membranes are moist.  Cardiovascular:     Rate and Rhythm: Normal rate and regular rhythm.     Pulses: Normal pulses.     Heart sounds: No murmur heard. Pulmonary:     Effort: Pulmonary effort is normal. No respiratory distress.     Breath sounds: Normal breath sounds.  Abdominal:     General: Abdomen is flat.     Tenderness: There is no abdominal tenderness.  Musculoskeletal:        General: Normal range of motion.  Skin:    General: Skin is warm and dry.     Comments: Left forearm fistula is pulsatile, without thrill. Nontender.  Neurological:     Mental Status: He is alert and oriented to person, place, and time.  Psychiatric:        Mood and Affect: Mood normal.        Behavior: Behavior normal.        Thought Content: Thought content normal.        Judgment: Judgment normal.     Imaging: No results found.  Labs:  CBC: Recent Labs    02/11/24 1427 02/11/24 1436 02/12/24 0626 02/13/24 0656 02/15/24 0805  WBC 13.6*  --  13.0* 10.9* 8.9  HGB 13.7 16.7 13.0 13.2 13.8  HCT 43.0 49.0 40.5 41.2 42.7  PLT 252  --  249 261 304    COAGS: Recent Labs    11/08/23 1830 02/12/24 0626  INR 1.2 1.3*  APTT 28 28    BMP: Recent Labs    02/11/24 1427 02/11/24 1436 02/12/24 0626 02/13/24 0656 02/15/24 0805  NA 125* 126* 128* 129* 132*  K 4.7 4.8 4.5 4.7 4.2  CL 80* 86* 88* 89* 88*  CO2 26  --  24 21* 24  GLUCOSE 530* 554* 277* 153* 177*  BUN 34* 33* 44* 59* 56*  CALCIUM 8.9  --  9.1 9.0 9.7   CREATININE 8.70* 9.30* 9.60* 11.53* 10.56*  GFRNONAA 6*  --  6* 5* 5*    LIVER FUNCTION TESTS: Recent Labs    11/17/23 0245 11/18/23 1515 12/02/23 1144 12/03/23 0246 12/13/23 1210 01/13/24 0455 02/11/24 1427 02/13/24 0656 02/15/24 0805  BILITOT 0.9  --  0.6 0.7  --   --  1.1  --   --   AST 27  --  27 35  --   --  17  --   --   ALT 23  --  20 21  --   --  18  --   --   ALKPHOS 78  --  90 88  --   --  137*  --   --   PROT 8.6*  --  7.2 6.9  --   --  9.4*  --   --   ALBUMIN 2.9*   < > 2.9* 2.8*   < > 3.1* 3.4* 3.4* 3.3*   < > = values in this interval not displayed.    TUMOR MARKERS: No results for input(s): "AFPTM", "CEA", "CA199", "CHROMGRNA" in the last 8760 hours.  Assessment and Plan: Patient presented to [facility] ED on 3/26, reporting 6 days of hematuria. History of diabetes and ESRD-HD. Poor compliance with insulin. He was admitted for uncontrolled hyperglycemia. He underwent HD yesterday, and was able to complete treatment. However, his fistula appears partially occluded today, with concerns for clot formation overnight s/p dialysis.   Interventional Radiology was requested for fistulogram with possible declot, possible TDC placement, or  other intervention. Patient presents for same in IR at [facility] today, and will return via EMS to [facility] subsequent to intervention.  Risks and benefits of the dialysis circuit study were discussed with the patient including, but not limited to bleeding, infection, vascular injury, and inability to improve the function of the fistula/graft which would require placement of a tunneled dialysis catheter.  Risks and benefits of tunneled hemodialysis catheter discussed with the patient including, but not limited to bleeding, infection, vascular injury, pneumothorax which may require chest tube placement, air embolism or even death  All of the patient's questions were answered, patient is agreeable to  proceed.  Consent signed and in chart.    Thank you for allowing our service to participate in [name] RICKE Sr. 's care.  Electronically Signed: Sable Feil, PA-C   02/16/2024, 1:07 PM      I spent a total of 40 Minutes  in face to face in clinical consultation, greater than 50% of which was counseling/coordinating care for vascular access concern, with consideration for fistulogram with possible declot vs. tunneled HD cath placement.

## 2024-02-17 DIAGNOSIS — E0865 Diabetes mellitus due to underlying condition with hyperglycemia: Secondary | ICD-10-CM | POA: Diagnosis not present

## 2024-02-17 DIAGNOSIS — S88111S Complete traumatic amputation at level between knee and ankle, right lower leg, sequela: Secondary | ICD-10-CM | POA: Diagnosis not present

## 2024-02-17 DIAGNOSIS — I10 Essential (primary) hypertension: Secondary | ICD-10-CM | POA: Diagnosis not present

## 2024-02-17 DIAGNOSIS — I2510 Atherosclerotic heart disease of native coronary artery without angina pectoris: Secondary | ICD-10-CM

## 2024-02-17 DIAGNOSIS — N186 End stage renal disease: Secondary | ICD-10-CM | POA: Diagnosis not present

## 2024-02-17 LAB — CULTURE, BLOOD (ROUTINE X 2)
Culture: NO GROWTH
Culture: NO GROWTH
Special Requests: ADEQUATE

## 2024-02-17 LAB — RENAL FUNCTION PANEL
Albumin: 3.5 g/dL (ref 3.5–5.0)
Anion gap: 17 — ABNORMAL HIGH (ref 5–15)
BUN: 43 mg/dL — ABNORMAL HIGH (ref 8–23)
CO2: 22 mmol/L (ref 22–32)
Calcium: 9.6 mg/dL (ref 8.9–10.3)
Chloride: 88 mmol/L — ABNORMAL LOW (ref 98–111)
Creatinine, Ser: 9.18 mg/dL — ABNORMAL HIGH (ref 0.61–1.24)
GFR, Estimated: 6 mL/min — ABNORMAL LOW (ref >60)
Glucose, Bld: 236 mg/dL — ABNORMAL HIGH (ref 70–99)
Phosphorus: 4.9 mg/dL — ABNORMAL HIGH (ref 2.5–4.6)
Potassium: 5.2 mmol/L — ABNORMAL HIGH (ref 3.5–5.1)
Sodium: 127 mmol/L — ABNORMAL LOW (ref 135–145)

## 2024-02-17 LAB — GLUCOSE, CAPILLARY
Glucose-Capillary: 150 mg/dL — ABNORMAL HIGH (ref 70–99)
Glucose-Capillary: 295 mg/dL — ABNORMAL HIGH (ref 70–99)
Glucose-Capillary: 257 mg/dL — ABNORMAL HIGH (ref 70–99)

## 2024-02-17 MED ORDER — MIDODRINE HCL 5 MG PO TABS
ORAL_TABLET | ORAL | Status: AC
  Filled 2024-02-17: qty 3

## 2024-02-17 MED ORDER — ATORVASTATIN CALCIUM 40 MG PO TABS: 40.0000 mg | ORAL_TABLET | Freq: Every day | ORAL | 5 refills | Status: DC

## 2024-02-17 MED ORDER — MIDODRINE HCL 5 MG PO TABS: 15.0000 mg | ORAL_TABLET | Freq: Three times a day (TID) | ORAL | 5 refills | Status: DC

## 2024-02-17 MED ORDER — HEPARIN SODIUM (PORCINE) 1000 UNIT/ML IJ SOLN
INTRAMUSCULAR | Status: AC
  Filled 2024-02-17: qty 7

## 2024-02-17 MED ORDER — HEPARIN SODIUM (PORCINE) 1000 UNIT/ML IJ SOLN: 4200.0000 [IU] | INTRAMUSCULAR | Status: DC | PRN

## 2024-02-17 MED ORDER — ASPIRIN 81 MG PO TBEC: 81.0000 mg | DELAYED_RELEASE_TABLET | Freq: Every day | ORAL | 2 refills | Status: DC

## 2024-02-17 MED ORDER — DOXERCALCIFEROL 4 MCG/2ML IV SOLN
INTRAVENOUS | Status: AC
  Filled 2024-02-17: qty 4

## 2024-02-17 NOTE — Progress Notes (Signed)
 Patient ID: [name], male   DOB: [birth year], 62 y.o.   MRN: [id number] S: complaining of pain at site of new LIJ TDC. O:BP 112/68 (BP Location: Right Arm)   Pulse 74   Temp 98.6 F (37 C) (Oral)   Resp (!) 24   Ht 6\' 1"  (1.854 m)   Wt 100.7 kg   SpO2 99%   BMI 29.29 kg/m   Intake/Output Summary (Last 24 hours) at 02/17/2024 7829 Last data filed at 02/17/2024 0500 Gross per 24 hour  Intake 360 ml  Output --  Net 360 ml   Intake/Output: I/O last 3 completed shifts: In: 600 [P.O.:600] Out: 500 [Other:500]  Intake/Output this shift:  No intake/output data recorded. Weight change:  Gen: NAD CVS: RRR Resp: CTA Abd: +BS, soft, NT/ND Ext: s/p RBKA, no edema, LUE AVF clotted.  Recent Labs  Lab 02/11/24 1427 02/11/24 1436 02/12/24 0626 02/13/24 0656 02/15/24 0805 02/17/24 0430  NA 125* 126* 128* 129* 132* 127*  K 4.7 4.8 4.5 4.7 4.2 5.2*  CL 80* 86* 88* 89* 88* 88*  CO2 26  --  24 21* 24 22  GLUCOSE 530* 554* 277* 153* 177* 236*  BUN 34* 33* 44* 59* 56* 43*  CREATININE 8.70* 9.30* 9.60* 11.53* 10.56* 9.18*  ALBUMIN 3.4*  --   --  3.4* 3.3* 3.5  CALCIUM 8.9  --  9.1 9.0 9.7 9.6  PHOS 4.7*  --   --  5.6* 5.0* 4.9*  AST 17  --   --   --   --   --   ALT 18  --   --   --   --   --    Liver Function Tests: Recent Labs  Lab 02/11/24 1427 02/13/24 0656 02/15/24 0805 02/17/24 0430  AST 17  --   --   --   ALT 18  --   --   --   ALKPHOS 137*  --   --   --   BILITOT 1.1  --   --   --   PROT 9.4*  --   --   --   ALBUMIN 3.4* 3.4* 3.3* 3.5   No results for input(s): "LIPASE", "AMYLASE" in the last 168 hours. No results for input(s): "AMMONIA" in the last 168 hours. CBC: Recent Labs  Lab 02/11/24 1427 02/11/24 1436 02/12/24 0626 02/13/24 0656 02/15/24 0805  WBC 13.6*  --  13.0* 10.9* 8.9  HGB 13.7   < > 13.0 13.2 13.8  HCT 43.0   < > 40.5 41.2 42.7  MCV 87.0  --  86.2 86.0 85.2  PLT 252  --  249 261 304   < > = values in this interval not displayed.    Cardiac Enzymes: No results for input(s): "CKTOTAL", "CKMB", "CKMBINDEX", "TROPONINI" in the last 168 hours. CBG: Recent Labs  Lab 02/16/24 0723 02/16/24 1126 02/16/24 1639 02/16/24 2125 02/17/24 0813  GLUCAP 142* 142* 150* 142* 295*    Iron Studies: No results for input(s): "IRON", "TIBC", "TRANSFERRIN", "FERRITIN" in the last 72 hours. 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 Ruel Favors, MD Guidance:  Ultrasound and fluoroscopic FLUOROSCOPY: Fluoroscopy Time: 2 minutes 6 seconds (9 mGy). MEDICATIONS: Ancef 2 g within 1 hour of the procedure ANESTHESIA/SEDATION: Versed  1 mg IV; Fentanyl 50 mcg IV; Moderate Sedation Time:  25 minute The patient was continuously monitored during the procedure by the interventional radiology nurse under my direct supervision. CONTRAST:  None. COMPLICATIONS: None immediate. PROCEDURE: Informed consent was obtained from the patient following explanation of the procedure, risks, benefits and alternatives. The patient understands, agrees and consents for the procedure. All questions were addressed. A time out was performed. 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 The aneurysmal chronic long-term fistula is nearly thrombosed. Extensive thrombus throughout the aneurysmal segments. More proximally, there is an outflow fistula cephalic vein stent which is also thrombosed. Therefore, this access has failed multiple interventions and is no longer long-term durable access. Therefore a catheter will be inserted. Patient will need vascular surgery evaluation for new access management. Maximal barrier sterile technique utilized including caps, mask,  sterile gowns, sterile gloves, large sterile drape, hand hygiene, and 2% chlorhexidine scrub. Under sterile conditions and local anesthesia, left external jugular micropuncture venous access was performed with ultrasound. Images were obtained for documentation of the patent left external jugular vein. A guide wire was inserted followed by a transitional dilator. Next, a 0.035 guidewire was advanced into the IVC with a 5-French catheter. Measurements were obtained from the left venotomy site to the proximal right atrium. In the left infraclavicular chest, a subcutaneous tunnel was created under sterile conditions and local anesthesia. 1% lidocaine with epinephrine was utilized for this. The 28 cm tip to cuff palindrome catheter was tunneled subcutaneously to the venotomy site and inserted into the SVC/RA junction through a valved peel-away sheath. Position was confirmed with fluoroscopy. Images were obtained for documentation. Blood was aspirated from the catheter followed by saline and heparin flushes. The appropriate volume and strength of heparin was instilled in each lumen. Caps were applied. The catheter was secured at the tunnel site with Gelfoam and a pursestring suture. The venotomy site was closed with subcuticular Vicryl suture. Dermabond was applied to the small right neck incision. A dry sterile dressing was applied. The catheter is ready for use. No immediate complications. IMPRESSION: Ultrasound and fluoroscopically guided left external jugular tunneled hemodialysis catheter (28 cm tip to cuff palindrome catheter). Electronically Signed   By: Judie Petit.  Shick M.D.   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 Ruel Favors, MD Guidance:   Ultrasound and fluoroscopic FLUOROSCOPY: Fluoroscopy Time: 2 minutes 6 seconds (9 mGy). MEDICATIONS: Ancef 2 g within 1 hour of the procedure ANESTHESIA/SEDATION: Versed 1 mg IV; Fentanyl 50 mcg IV; Moderate Sedation Time:  25 minute The patient was continuously monitored during the procedure by the interventional radiology nurse under my direct supervision. CONTRAST:  None. COMPLICATIONS: None immediate. PROCEDURE: Informed consent was obtained from the patient following explanation of the procedure, risks, benefits and alternatives. The patient understands, agrees and consents for the procedure. All questions were addressed. A time out was performed. 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 The aneurysmal chronic long-term fistula is nearly thrombosed. Extensive thrombus throughout the aneurysmal segments. More proximally, there is an outflow fistula cephalic vein stent which is also thrombosed. Therefore, this access has failed multiple interventions and is no longer long-term durable access. Therefore a catheter will be inserted.  Patient will need vascular surgery evaluation for new access management. Maximal barrier sterile technique utilized including caps, mask, sterile gowns, sterile gloves, large sterile drape, hand hygiene, and 2% chlorhexidine scrub. Under sterile conditions and local anesthesia, left external jugular micropuncture venous access was performed with ultrasound. Images were obtained for documentation of the patent left external jugular vein. A guide wire was inserted followed by a transitional dilator. Next, a 0.035 guidewire was advanced into the IVC with a 5-French catheter. Measurements were obtained from the left venotomy site to the proximal right atrium. In the left infraclavicular chest, a subcutaneous tunnel was created under sterile conditions and local anesthesia. 1% lidocaine with epinephrine was utilized for this. The 28 cm  tip to cuff palindrome catheter was tunneled subcutaneously to the venotomy site and inserted into the SVC/RA junction through a valved peel-away sheath. Position was confirmed with fluoroscopy. Images were obtained for documentation. Blood was aspirated from the catheter followed by saline and heparin flushes. The appropriate volume and strength of heparin was instilled in each lumen. Caps were applied. The catheter was secured at the tunnel site with Gelfoam and a pursestring suture. The venotomy site was closed with subcuticular Vicryl suture. Dermabond was applied to the small right neck incision. A dry sterile dressing was applied. The catheter is ready for use. No immediate complications. IMPRESSION: Ultrasound and fluoroscopically guided left external jugular tunneled hemodialysis catheter (28 cm tip to cuff palindrome catheter). Electronically Signed   By: Judie Petit.  Shick M.D.   On: 02/16/2024 15:23    aspirin EC  81 mg Oral Daily   atorvastatin  40 mg Oral Daily   Chlorhexidine Gluconate Cloth  6 each Topical Q0600   cinacalcet  60 mg Oral Q breakfast   doxercalciferol  5 mcg Intravenous Q M,W,F-HD   heparin  5,000 Units Subcutaneous Q8H   insulin aspart  0-15 Units Subcutaneous TID WC   insulin aspart  0-5 Units Subcutaneous QHS   insulin aspart  5 Units Subcutaneous TID WC   insulin glargine-yfgn  38 Units Subcutaneous Daily   lidocaine-prilocaine   Topical Q M,W,F-HD   midodrine  15 mg Oral TID WC   pantoprazole  40 mg Oral Daily   sevelamer carbonate  4,000 mg Oral TID WC    BMET    Component Value Date/Time   NA 127 (L) 02/17/2024 0430   K 5.2 (H) 02/17/2024 0430   CL 88 (L) 02/17/2024 0430   CO2 22 02/17/2024 0430   GLUCOSE 236 (H) 02/17/2024 0430   BUN 43 (H) 02/17/2024 0430   CREATININE 9.18 (H) 02/17/2024 0430   CALCIUM 9.6 02/17/2024 0430   GFRNONAA 6 (L) 02/17/2024 0430   GFRAA 3 (L) 08/14/2019 2051   CBC    Component Value Date/Time   WBC 8.9 02/15/2024 0805   RBC  5.01 02/15/2024 0805   HGB 13.8 02/15/2024 0805   HGB 13.0 12/17/2022 1242   HCT 42.7 02/15/2024 0805   PLT 304 02/15/2024 0805   PLT 272 12/17/2022 1242   MCV 85.2 02/15/2024 0805   MCH 27.5 02/15/2024 0805   MCHC 32.3 02/15/2024 0805   RDW 16.5 (H) 02/15/2024 0805   LYMPHSABS 1.3 01/15/2024 1729   MONOABS 1.4 (H) 01/15/2024 1729   EOSABS 0.5 01/15/2024 1729   BASOSABS 0.1 01/15/2024 1729    Outpt HD orders:  MWF [facility] 4:15 180 NR, BFR 450/DFR 700 EDW 99.5kg 2K/2Ca 3/26 post wt 99.8kg, 3/24 101.3 hectorol 5 qtx No heparin  Assessment/Plan Vascular access - LUE AVF is now pulsatile without bruit.  Unable to declot in IR so now has L internal jugular TDC.  Appreciate Dr. Chester Holstein assistance. Sepsis:  presumed urinary source but cultures are pending (negative to date) and he's on broad spectrum abx.   ESRD on HD:  HD MWF. Has been off of his outpatient schedule due to issues with hypotension but had HD yesterday.  Will plan for HD today with new LIJ TDC.  WIll continue midodrine 15mg  TID. Hyponatremia:  will correct with HD.  Secondary hyperPTH: cont outpt VDRA IV with HD. Cont outpt binder.  HFrEF:  EF 30-35%. Volume management with HD.  - does not appear to have clinically significant volume overload today. Anemia of CKD: Hb 13s, not active issue. DM with marked hyperglyemia on presentation:  BG improving with insulin here.   Irena Cords, MD [employer] [phone]

## 2024-02-17 NOTE — Discharge Instructions (Signed)
 1)Very Low-salt diet advised---Less than 2 gm of Sodium per day advised----ok to use Mrs DASH salt substitute instead of Salt 2)Weigh yourself daily, call if you gain more than 3 pounds in 1 day or more than 5 pounds in 1 week as your Hemodialysis schedule may need to be adjusted 3)Limit your Fluid  intake to no more than 50 ounces (1.5 Liters) per day 4)Avoid ibuprofen/Advil/Aleve/Motrin/Goody Powders/Naproxen/BC powders/Meloxicam/Diclofenac/Indomethacin and other Nonsteroidal anti-inflammatory medications as these will make you more likely to bleed and can cause stomach ulcers, can also cause Kidney problems.

## 2024-02-17 NOTE — TOC Transition Note (Signed)
 Transition of Care [facility]) - Discharge Note   Patient Details  Name: [name] COCUZZA Sr. MRN: [id number] Date of Birth: 1961/09/07  [facility] CM/SW Contact:  Karn Cassis, LCSW Phone Number: 02/17/2024, 2:14 PM   Clinical Narrative: Pt d/c today. Artavia with [facility] notified. Home health orders are in.       Final next level of care: Home w Home Health Services Barriers to Discharge: Barriers Resolved   Patient Goals and CMS Choice Patient states their goals for this hospitalization and ongoing recovery are:: Return to baseline.          Discharge Placement                       Discharge Plan and Services Additional resources added to the After Visit Summary for   In-house Referral: Clinical Social Work Discharge Planning Services: CM Consult                      HH Arranged: PT, OT HH Agency: [facility] Date HH Agency Contacted: 02/16/24 Time HH Agency Contacted: [phone] Representative spoke with at [facility] Agency: Adele Dan  Social Drivers of Health (SDOH) Interventions SDOH Screenings   Food Insecurity: No Food Insecurity (02/11/2024)  Housing: Low Risk  (02/11/2024)  Transportation Needs: No Transportation Needs (02/11/2024)  Utilities: Not At Risk (02/11/2024)  Alcohol Screen: Low Risk  (02/04/2022)  Depression (PHQ2-9): Low Risk  (08/06/2022)  Tobacco Use: Low Risk  (02/14/2024)     Readmission Risk Interventions    02/11/2024    9:09 PM 11/05/2022    3:08 PM  Readmission Risk Prevention Plan  Transportation Screening Complete Complete  Medication Review (RN Care Manager) Complete Complete  PCP or Specialist appointment within 3-5 days of discharge Complete Complete  HRI or Home Care Consult Complete Complete  SW Recovery Care/Counseling Consult  Complete  Palliative Care Screening Not Applicable Complete  Skilled Nursing Facility Complete Complete

## 2024-02-17 NOTE — Progress Notes (Signed)
   HEMODIALYSIS TREATMENT NOTE:  Uneventful 3.5 hour treatment completed using new left EJ TDC.  Goal met: 1.3 liters removed.  No interruption in UF.  Unable to return all of venous blood line volume due to clotting circuit. EBL 100 ml  Post-HD:  02/17/24 1610  Vital Signs  Temp 98.3 F (36.8 C)  Temp Source Oral  Pulse Rate 78  Pulse Rate Source Monitor  Resp 18  BP 113/70  BP Location Right Arm  BP Method Automatic  Patient Position (if appropriate) Lying  Oxygen Therapy  SpO2 100 %  O2 Device Room Air  Pain Assessment  Pain Score 0  Dialysis Weight  Weight 102 kg  Type of Weight Post-Dialysis  Post Treatment  Dialyzer Clearance Heavily streaked  Hemodialysis Intake (mL) 0 mL  Liters Processed 72.9  Fluid Removed (mL) 1300 mL  Tolerated HD Treatment Yes  Post-Hemodialysis Comments goal met    Arman Filter, RN AP [facility]

## 2024-02-17 NOTE — Discharge Summary (Signed)
 [name] MULLARKEY Sr., is a 63 y.o. male  DOB 1961-04-26  MRN [id number].  Admission date:  02/11/2024  Admitting Physician  Starleen Arms, MD  Discharge Date:  02/17/2024   Primary MD  Pcp, No  Recommendations for primary care physician for things to follow:  1)Very Low-salt diet advised---Less than 2 gm of Sodium per day advised----ok to use Mrs DASH salt substitute instead of Salt 2)Weigh yourself daily, call if you gain more than 3 pounds in 1 day or more than 5 pounds in 1 week as your Hemodialysis schedule may need to be adjusted 3)Limit your Fluid  intake to no more than 50 ounces (1.5 Liters) per day 4)Avoid ibuprofen/Advil/Aleve/Motrin/Goody Powders/Naproxen/BC powders/Meloxicam/Diclofenac/Indomethacin and other Nonsteroidal anti-inflammatory medications as these will make you more likely to bleed and can cause stomach ulcers, can also cause Kidney problems.  Admission Diagnosis  Hyperglycemia [R73.9] Hypotension [I95.9] Hypotension, unspecified hypotension type [I95.9] Uncontrolled diabetes mellitus with hyperglycemia (HCC) [E11.65]   Discharge Diagnosis  Hyperglycemia [R73.9] Hypotension [I95.9] Hypotension, unspecified hypotension type [I95.9] Uncontrolled diabetes mellitus with hyperglycemia (HCC) [E11.65]    Principal Problem:   Uncontrolled diabetes mellitus with hyperglycemia (HCC) Active Problems:   ESRD on dialysis (HCC)   Hypotension   HTN (hypertension)   CAD (coronary artery disease)   DM2 (diabetes mellitus, type 2) (HCC)   Below-knee amputation of right lower extremity (HCC)   Obesity (BMI 30-39.9)      Past Medical History:  Diagnosis Date   ESRD on hemodialysis (HCC)    a. HD MWF > 10 years.   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 01/2022 Echo: EF 45-50%, glob HK, mod LVH, mildly reduced RV fxn, mild MR, mod-sev MV Ca2+. Mild AS; b. 10/2023 Echo: EF  45-50%, glob HK, GrII DD, mildly reduced RV fxn, RVSP 52.54mmHg. Mild MR/MS, mod TR.   Hypertension    Nonischemic cardiomyopathy (HCC)    a. 2019 Cath [facility]): Nonobstructive disease; b. 12/2019 Echo: EF 50%; c. 12/2019 MV (Tx w/u [facility]): Ant infarct w/ peri-infarct ischemia. EF 35%; c. 05/2021 MV (Tx w/u [facility]): Cor Ca2+, EF 53%, motion artifact, equiv study, likely low risk.   Nonobstructive coronary artery disease    a.  2019 Cath ([place]): Left main normal, LAD 30 proximal, RCA 30 distal, EF 25%.   Obesity    Type 2 diabetes mellitus (HCC)    a. 10/2023 - admission w/ HHS.    Past Surgical History:  Procedure Laterality Date   A/V FISTULAGRAM N/A 08/15/2019   Procedure: A/V FISTULAGRAM;  Surgeon: Maeola Harman, MD;  Location: [facility] INVASIVE CV LAB;  Service: Cardiovascular;  Laterality: N/A;   A/V FISTULAGRAM N/A 01/21/2024   Procedure: A/V Fistulagram;  Surgeon: Ethelene Hal, MD;  Location: [facility] INVASIVE CV LAB;  Service: Cardiovascular;  Laterality: N/A;   AMPUTATION Right 02/02/2022   Procedure: AMPUTATION 5th RAY;  Surgeon: Louann Sjogren, MD;  Location: [facility] OR;  Service: Podiatry;  Laterality: Right;   AMPUTATION  Right 05/23/2022   Procedure: RIGHT BELOW KNEE AMPUTATION;  Surgeon: Nadara Mustard, MD;  Location: [facility] OR;  Service: Orthopedics;  Laterality: Right;   AMPUTATION Right 07/04/2022   Procedure: REVISION AMPUTATION BELOW KNEE;  Surgeon: Nadara Mustard, MD;  Location: [facility] OR;  Service: Orthopedics;  Laterality: Right;   APPLICATION OF WOUND VAC  05/23/2022   Procedure: APPLICATION OF WOUND VAC;  Surgeon: Nadara Mustard, MD;  Location: [facility] OR;  Service: Orthopedics;;   AV FISTULA PLACEMENT     CARDIAC CATHETERIZATION  02/17/2018   LHC 02/17/18 ([name]): 30% pLAD, 30% dRCA, LVEDP 25-30, LVEF 25%.     CATARACT EXTRACTION W/PHACO Right 03/29/2020   Procedure: CATARACT EXTRACTION PHACO AND INTRAOCULAR LENS PLACEMENT (IOC)  RIGHT DIABETIC;  Surgeon:  Lockie Mola, MD;  Location: [facility] ORS;  Service: Ophthalmology;  Laterality: Right;  Lot # [id number] H [place]: 02:30.9 AP% 9.5% CDE: 14.38   EYE SURGERY Right    cataract removed   [name] N/A 12/02/2023   Procedure: [name];  Surgeon: Imogene Burn, MD;  Location: [facility] ENDOSCOPY;  Service: Gastroenterology;  Laterality: N/A;   I & D EXTREMITY Right 02/02/2022   Procedure: IRRIGATION AND DEBRIDEMENT RIGHT FOOT;  Surgeon: Louann Sjogren, MD;  Location: [facility] OR;  Service: Podiatry;  Laterality: Right;   INCISION AND DRAINAGE OF WOUND Right 02/08/2022   Procedure: IRRIGATION AND DEBRIDEMENT WOUND;  Surgeon: Asencion Islam, DPM;  Location: [facility] OR;  Service: Podiatry;  Laterality: Right;  with removal of infected bone    INSERTION OF DIALYSIS CATHETER  08/13/2019   Procedure: Insertion Of Dialysis Catheter;  Surgeon: Larina Earthly, MD;  Location: [facility] OR;  Service: Vascular;;   INSERTION OF DIALYSIS CATHETER Left 07/19/2020   Procedure: INSERTION OF DIALYSIS CATHETER;  Surgeon: Maeola Harman, MD;  Location: [facility] OR;  Service: Vascular;  Laterality: Left;   IR FLUORO GUIDE CV LINE LEFT  02/16/2024   IR LUMBAR PUNCTURE  11/20/2023   IR US GUIDE BX ASP/DRAIN  11/07/2022   IR US GUIDE VASC ACCESS LEFT  02/16/2024   IRRIGATION AND DEBRIDEMENT ABSCESS     PERIPHERAL VASCULAR BALLOON ANGIOPLASTY  08/15/2019   Procedure: PERIPHERAL VASCULAR BALLOON ANGIOPLASTY;  Surgeon: Maeola Harman, MD;  Location: [facility] INVASIVE CV LAB;  Service: Cardiovascular;;   PERIPHERAL VASCULAR BALLOON ANGIOPLASTY Left 02/06/2022   Procedure: PERIPHERAL VASCULAR BALLOON ANGIOPLASTY;  Surgeon: Cephus Shelling, MD;  [facility] INVASIVE CV LAB;  Service: Cardiovascular;  Laterality: Left;   PERIPHERAL VASCULAR BALLOON ANGIOPLASTY  01/21/2024   Procedure: PERIPHERAL VASCULAR BALLOON ANGIOPLASTY;  Surgeon: Ethelene Hal, MD;  Location: [facility] INVASIVE CV LAB;  Service: Cardiovascular;;  Cephalic Arch    RADIOLOGY WITH ANESTHESIA N/A 11/20/2023   Procedure: MRI WITH ANESTHESIA;  Surgeon: Radiologist, Medication, MD;  Location: [facility] OR;  Service: Radiology;  Laterality: N/A;   RADIOLOGY WITH ANESTHESIA N/A 11/20/2023   Procedure: IR WITH ANESTHESIA;  Surgeon: Gilmer Mor, DO;  Location: [facility] OR;  Service: Anesthesiology;  Laterality: N/A;  PLEASE OBTAIN OPENING PRESSURE AND DRAIN AT LEAST 30CC'S OF CSF.   REVISON OF ARTERIOVENOUS FISTULA Left 07/19/2020   Procedure: REVISON/PLICATION OF ARTERIOVENOUS FISTULA LEFT;  Surgeon: Maeola Harman, MD;  Location: [facility] OR;  Service: Vascular;  Laterality: Left;   THROMBECTOMY AND REVISION OF ARTERIOVENTOUS (AV) GORETEX  GRAFT Left 08/13/2019   Procedure: THROMBECTOMY OF LEFT ARM ARTERIOVENTOUS (AV) FISTULA;  Surgeon: Larina Earthly, MD;  Location: [facility] OR;  Service: Vascular;  Laterality: Left;   TOE AMPUTATION       HPI  from the history and physical done on the day of admission:   Asser Lucena  is a 63 y.o. male,  with medical history significant of ESRD-HD (MWF), DM2, HTN, nonischemic cardiomyopathy, systolic CHF, prior right BKA with known history of orthostasis,. -Patient recently secondary to complaints of dysuria, increased urinary frequency and urgency with dysuria, reports symptom has been ongoing for last 7 days, he saw his urgent care yesterday, where he was prescribed Cipro, and reported to provide urine sample for available from home, patient reports last dysuria was this morning, he still makes small amount of urine daily, has any fever, chills, he was prescribed ciprofloxacin by urgent care yesterday. -In ED his workup was significant for hypotension, apparently he has not been taking his midodrine as instructed, this has resolved with fluid bolus and resuming midodrine, as well blood glucose was significantly elevated at 530, he received subcu insulin, In-N-Out was nonyielding with no urine could be obtained, [facility] hospitalist consulted to  admit.    Hospital Course:   Brief Narrative: ARSAL TAPPAN Sr. is a 63 y.o. male with a history of ESRD (HD on MWF), IDT2DM, HTN, HFrEF/NICM, PAD s/p right BKA, and orthostatic hypotension who presented to the ED 3/27 with dysuria, increased urinary frequency and urgency with dysuria, reports symptom has been ongoing for last 7 days. He had been prescribed cipro at urgent care the previous day but never got it. In the ED he was hyperglycemic and hypotensive (having not taken midodrine home medication) which improved with IV fluids initially. He was given insulin and midodrine, admitted to SDU, later developed fever so antibiotics initially given were broadened and cultures drawn. He has stabilized, subsequently transferred to the floor 3/31. On 4/1 there is suspicion that AVF has clotted, IR consulted.    Assessment and Plan: 1)Hypotension: Concern for sepsis with leukocytosis, fever, urinary source. Fortunately he's relatively asymptomatic and lactic acid is normal.  - BPs overall improved - continue midodrine 15mg  TID for pressure support - Albumin prn particularly helpful at dialysis.  - Hgb stable, normal  -Treated with vancomycin and cefepime with HD -Blood and urine cultures negative -Initial suspicion for sepsis Not confirmed -No further antibiotics indicated at this time   2)Suspicion of possible UTI: --Urine and blood cultures negative -UTI involved confirmed -Antibiotics as above #1  3)IDT2DM: Uncontrolled with hyperglycemia. Severely elevated CBG initially though no ketoacidosis. Previous HbA1c >15.5% consistent with average blood sugar over 400mg /dl though recheck here was 8.4%.   -Follow-up with PCP for further adjustment of diabetic regimen   4)AVF clot: Please see IR note from Dr. Ines Bloomer dated 02/16/2024 - IR consulted for declot vs. TDC placement.- -s/p SUCCESSFUL LEFT IJ HD CATH WITH TIP SVCRA on 02/16/24   5)ESRD:  - Hyponatremia and volume status managed with  HD.   --Please see HD access as noted in #4 above   6)CAD, HLD, s/p right BKA: No chest pain.  - Continue aspirin, statin.    Obesity: Body mass index is 29.29 kg/m.  .-Dietary and lifestyle modifications discussed  Discharge Condition: Stable  Follow UP   Follow-up Information     [place], [facility] Follow up.   Contact information: [address] [place] [address] [phone]                  Consults obtained - IR/Nephrology  Diet and Activity recommendation:  As advised  Discharge Instructions    Discharge Instructions     Call MD for:  difficulty breathing, headache or visual disturbances   Complete by: As directed    Call MD for:  persistant dizziness or light-headedness   Complete by: As directed    Call MD for:  persistant nausea and vomiting   Complete by: As directed    Call MD for:  temperature >100.4   Complete by: As directed    Diet - low sodium heart healthy   Complete by: As directed    Discharge instructions   Complete by: As directed    1)Very Low-salt diet advised---Less than 2 gm of Sodium per day advised----ok to use Mrs DASH salt substitute instead of Salt 2)Weigh yourself daily, call if you gain more than 3 pounds in 1 day or more than 5 pounds in 1 week as your Hemodialysis schedule may need to be adjusted 3)Limit your Fluid  intake to no more than 50 ounces (1.5 Liters) per day 4)Avoid ibuprofen/Advil/Aleve/Motrin/Goody Powders/Naproxen/BC powders/Meloxicam/Diclofenac/Indomethacin and other Nonsteroidal anti-inflammatory medications as these will make you more likely to bleed and can cause stomach ulcers, can also cause Kidney problems.   Increase activity slowly   Complete by: As directed        Discharge Medications     Allergies as of 02/17/2024       Reactions   Sulfa Antibiotics Diarrhea, Nausea And Vomiting        Medication List     STOP taking these medications    ciprofloxacin 500 MG  tablet Commonly known as: CIPRO       TAKE these medications    acetaminophen 500 MG tablet Commonly known as: TYLENOL Take 1,000 mg by mouth 2 (two) times daily as needed for moderate pain (pain score 4-6), headache or fever.   aspirin EC 81 MG tablet Take 1 tablet (81 mg total) by mouth daily with breakfast. Swallow whole. What changed: when to take this   atorvastatin 40 MG tablet Commonly known as: LIPITOR Take 1 tablet (40 mg total) by mouth daily.   BD Pen Needle Nano U/F 32G X 4 MM Misc Generic drug: Insulin Pen Needle Use with insulin pen.   cinacalcet 60 MG tablet Commonly known as: SENSIPAR Take 60 mg by mouth daily.   Dialyvite 800 0.8 MG Tabs Take 1 tablet by mouth daily.   lidocaine-prilocaine cream Commonly known as: EMLA Apply 1 Application topically every Monday, Wednesday, and Friday with hemodialysis.   midodrine 5 MG tablet Commonly known as: PROAMATINE Take 3 tablets (15 mg total) by mouth 3 (three) times daily with meals. What changed: how much to take   NovoLIN 70/30 Kwikpen (70-30) 100 UNIT/ML KwikPen Generic drug: insulin isophane & regular human KwikPen Inject 30U in the morning and 25U in the evening   pantoprazole 40 MG tablet Commonly known as: PROTONIX Take 1 tablet (40 mg total) by mouth 2 (two) times daily.   sevelamer carbonate 800 MG tablet Commonly known as: RENVELA Take 2,400-5,600 mg by mouth See admin instructions. Take 7 tablets (4000mg ) three times daily with meals and 3 tablets (2400mg ) twice daily with snacks.        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  Ruel Favors, MD Guidance:  Ultrasound and fluoroscopic FLUOROSCOPY: Fluoroscopy Time: 2 minutes 6 seconds (9 mGy). MEDICATIONS: Ancef 2 g within 1 hour of the procedure ANESTHESIA/SEDATION: Versed 1 mg IV; Fentanyl 50 mcg IV; Moderate Sedation Time:  25 minute The patient was continuously monitored during the procedure by the interventional radiology nurse under my direct supervision. CONTRAST:  None. COMPLICATIONS: None immediate. PROCEDURE: Informed consent was obtained from the patient following explanation of the procedure, risks, benefits and alternatives. The patient understands, agrees and consents for the procedure. All questions were addressed. A time out was performed. 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 The aneurysmal chronic long-term fistula is nearly thrombosed. Extensive thrombus throughout the aneurysmal segments. More proximally, there is an outflow fistula cephalic vein stent which is also thrombosed. Therefore, this access has failed multiple interventions and is no longer long-term durable access. Therefore a catheter will be inserted. Patient will need vascular surgery evaluation for new access management. Maximal barrier sterile technique utilized including caps, mask, sterile gowns, sterile gloves, large sterile drape, hand hygiene, and 2% chlorhexidine scrub. Under sterile conditions and local anesthesia, left external jugular micropuncture venous access was performed with ultrasound. Images were obtained for documentation of the patent left external jugular vein. A guide wire was inserted followed by a transitional dilator. Next, a 0.035 guidewire was advanced into the IVC with a 5-French catheter. Measurements were obtained from the left venotomy site to the proximal right atrium. In the left infraclavicular chest, a subcutaneous tunnel was created under sterile conditions and local anesthesia. 1% lidocaine with epinephrine was  utilized for this. The 28 cm tip to cuff palindrome catheter was tunneled subcutaneously to the venotomy site and inserted into the SVC/RA junction through a valved peel-away sheath. Position was confirmed with fluoroscopy. Images were obtained for documentation. Blood was aspirated from the catheter followed by saline and heparin flushes. The appropriate volume and strength of heparin was instilled in each lumen. Caps were applied. The catheter was secured at the tunnel site with Gelfoam and a pursestring suture. The venotomy site was closed with subcuticular Vicryl suture. Dermabond was applied to the small right neck incision. A dry sterile dressing was applied. The catheter is ready for use. No immediate complications. IMPRESSION: Ultrasound and fluoroscopically guided left external jugular tunneled hemodialysis catheter (28 cm tip to cuff palindrome catheter). Electronically Signed   By: Judie Petit.  Shick M.D.   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 Ruel Favors, MD Guidance:  Ultrasound and fluoroscopic FLUOROSCOPY: Fluoroscopy Time: 2 minutes 6 seconds (9 mGy). MEDICATIONS: Ancef 2 g within 1 hour of the procedure ANESTHESIA/SEDATION: Versed 1 mg IV; Fentanyl 50 mcg IV; Moderate Sedation Time:  25 minute The patient was continuously monitored during the procedure by the interventional radiology nurse under my direct supervision. CONTRAST:  None. COMPLICATIONS: None immediate. PROCEDURE: Informed consent was obtained from the patient following explanation of the procedure, risks, benefits and alternatives. The patient understands, agrees and consents for the procedure. All questions were addressed. A time out was performed. 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 The aneurysmal chronic long-term fistula  is nearly thrombosed. Extensive thrombus throughout the aneurysmal segments. More proximally, there is an outflow fistula cephalic vein stent which is also thrombosed. Therefore, this access has failed multiple interventions and is no longer long-term durable access. Therefore a catheter will be inserted. Patient will need vascular surgery evaluation for new access management. Maximal barrier sterile technique utilized including caps, mask, sterile gowns, sterile gloves, large sterile drape, hand hygiene, and 2% chlorhexidine scrub. Under sterile conditions and local anesthesia, left external jugular micropuncture venous access was performed with ultrasound. Images were obtained for documentation of the patent left external jugular vein. A guide wire was inserted followed by a transitional dilator. Next, a 0.035 guidewire was advanced into the IVC with a 5-French catheter. Measurements were obtained from the left venotomy site to the proximal right atrium. In the left infraclavicular chest, a subcutaneous tunnel was created under sterile conditions and local anesthesia. 1% lidocaine with epinephrine was utilized for this. The 28 cm tip to cuff palindrome catheter was tunneled subcutaneously to the venotomy site and inserted into the SVC/RA junction through a valved peel-away sheath. Position was confirmed with fluoroscopy. Images were obtained for documentation. Blood was aspirated from the catheter followed by saline and heparin flushes. The appropriate volume and strength of heparin was instilled in each lumen. Caps were applied. The catheter was secured at the tunnel site with Gelfoam and a pursestring suture. The venotomy site was closed with subcuticular Vicryl suture. Dermabond was applied to the small right neck incision. A dry sterile dressing was applied. The catheter is ready for use.  No immediate complications. IMPRESSION: Ultrasound and fluoroscopically guided left external jugular tunneled hemodialysis catheter (28 cm tip to cuff palindrome catheter). Electronically Signed   By: Judie Petit.  Shick M.D.   On: 02/16/2024 15:23    Micro Results   Recent Results (from the past 240 hours)  Aerobic Culture w Gram Stain (superficial specimen)     Status: None   Collection Time: 02/11/24  4:27 PM   Specimen: Penis  Result Value Ref Range Status   Specimen Description   Final    PENIS Performed at [facility], [address] [place], [place] [address]    Special Requests   Final    Immunocompromised Performed at [facility], [address], [place], [place] [address]    Gram Stain NO WBC SEEN NO ORGANISMS SEEN   Final   Culture   Final    RARE HAEMOPHILUS INFLUENZAE BETA LACTAMASE POSITIVE RARE DIPHTHEROIDS(CORYNEBACTERIUM SPECIES) Standardized susceptibility testing for this organism is not available. Performed at [facility], [address] [place], [place] [address]    Report Status 02/14/2024 FINAL  Final  MRSA Next Gen by PCR, Nasal     Status: Abnormal   Collection Time: 02/11/24  9:12 PM   Specimen: Nasal Mucosa; Nasal Swab  Result Value Ref Range Status   MRSA by PCR Next Gen DETECTED (A) NOT DETECTED Final    Comment: RESULT CALLED TO, READ BACK BY AND VERIFIED WITH: Elvin So [id number], VIRAY,J (NOTE) The GeneXpert MRSA Assay (FDA approved for NASAL specimens only), is one component of a comprehensive MRSA colonization surveillance program. It is not intended to diagnose MRSA infection nor to guide or monitor treatment for MRSA infections. Test performance is not FDA approved in patients less than 31 years old. Performed at [facility], [address], [place], [place] [address]   Culture, blood (x 2)     Status: None   Collection  Time: 02/12/24  6:26 AM   Specimen: BLOOD  Result Value Ref Range Status   Specimen Description BLOOD  BLOOD RIGHT HAND  Final   Special Requests   Final    BOTTLES DRAWN AEROBIC ONLY Blood Culture results may not be optimal due to an inadequate volume of blood received in culture bottles   Culture   Final    NO GROWTH 5 DAYS Performed at [facility], [address], [place], [place] [address]    Report Status 02/17/2024 FINAL  Final  Culture, blood (x 2)     Status: None   Collection Time: 02/12/24  6:32 AM   Specimen: BLOOD  Result Value Ref Range Status   Specimen Description BLOOD RIGHT ANTECUBITAL  Final   Special Requests   Final    BOTTLES DRAWN AEROBIC AND ANAEROBIC Blood Culture adequate volume   Culture   Final    NO GROWTH 5 DAYS Performed at [facility], [address], [place], [place] [address]    Report Status 02/17/2024 FINAL  Final  Urine Culture (for pregnant, neutropenic or urologic patients or patients with an indwelling urinary catheter)     Status: None   Collection Time: 02/12/24  5:10 PM   Specimen: Urine, Catheterized  Result Value Ref Range Status   Specimen Description   Final    URINE, CATHETERIZED Performed at [facility], [address] [place], [place] [address]    Special Requests   Final    NONE Performed at [facility], [address], [place], [place] [address]    Culture   Final    NO GROWTH Performed at [name], [address] [place], [place] [address]    Report Status 02/14/2024 FINAL  Final    Today   Subjective    Payden Docter today has no new complaints No fever  Or chills   No Nausea, Vomiting or Diarrhea   Patient has been seen and examined prior to discharge   Objective   Blood pressure 109/72, pulse 68, temperature 98.6 F (37 C), temperature source Oral, resp. rate 16, height 6\' 1"  (1.854 m), weight 100.7 kg, SpO2 98%.   Intake/Output Summary (Last 24 hours) at 02/17/2024 1549 Last data filed at 02/17/2024 0500 Gross per 24 hour  Intake 360 ml  Output --  Net 360 ml    Exam Gen:- Awake Alert, no  acute distress  HEENT:- [place].AT, No sclera icterus Neck-Supple Neck, Lt internal jugular HD catheter Lungs-  CTAB , good air movement bilaterally CV- S1, S2 normal, regular Abd-  +ve B.Sounds, Abd Soft, No tenderness,    Extremity/Skin:- No  edema,   good pulses Psych-affect is appropriate, oriented x3 Neuro-no new focal deficits, no tremors  MSK-- Lt UE AVF without bruit or thrill--clotted -Right BKA   Data Review   CBC w Diff:  Lab Results  Component Value Date   WBC 8.9 02/15/2024   HGB 13.8 02/15/2024   HGB 13.0 12/17/2022   HCT 42.7 02/15/2024   PLT 304 02/15/2024   PLT 272 12/17/2022   LYMPHOPCT 18 01/15/2024   MONOPCT 19 01/15/2024   EOSPCT 6 01/15/2024   BASOPCT 1 01/15/2024    CMP:  Lab Results  Component Value Date   NA 127 (L) 02/17/2024   K 5.2 (H) 02/17/2024   CL 88 (L) 02/17/2024   CO2 22 02/17/2024   BUN 43 (H) 02/17/2024   CREATININE 9.18 (H) 02/17/2024   PROT 9.4 (H) 02/11/2024  PROT 8.1 11/20/2022   ALBUMIN 3.5 02/17/2024   ALBUMIN 4.0 11/20/2023   BILITOT 1.1 02/11/2024   ALKPHOS 137 (H) 02/11/2024   AST 17 02/11/2024   ALT 18 02/11/2024  .  Total Discharge time is about 33 minutes  Shon Hale M.D on 02/17/2024 at 3:49 PM  Go to [email or web address] -  for contact info  [facility] - Office  [phone]

## 2024-03-03 ENCOUNTER — Other Ambulatory Visit: Payer: Self-pay | Admitting: *Deleted

## 2024-03-03 DIAGNOSIS — T82590A Other mechanical complication of surgically created arteriovenous fistula, initial encounter: Secondary | ICD-10-CM

## 2024-03-17 ENCOUNTER — Other Ambulatory Visit: Payer: Self-pay | Admitting: *Deleted

## 2024-03-17 ENCOUNTER — Ambulatory Visit (HOSPITAL_COMMUNITY)
Admission: RE | Admit: 2024-03-17 | Discharge: 2024-03-17 | Disposition: A | Discharge status: Home / Self Care | Source: Ambulatory Visit | Attending: Vascular Surgery | Admitting: Vascular Surgery

## 2024-03-17 ENCOUNTER — Encounter: Payer: Self-pay | Admitting: Vascular Surgery

## 2024-03-17 ENCOUNTER — Ambulatory Visit: Admitting: Vascular Surgery

## 2024-03-17 VITALS — BP 117/73 | HR 83 | Temp 98.2°F | Resp 18 | Ht 73.0 in | Wt 224.0 lb

## 2024-03-17 DIAGNOSIS — T82590A Other mechanical complication of surgically created arteriovenous fistula, initial encounter: Secondary | ICD-10-CM | POA: Diagnosis present

## 2024-03-17 DIAGNOSIS — N186 End stage renal disease: Secondary | ICD-10-CM | POA: Diagnosis not present

## 2024-03-17 DIAGNOSIS — Z992 Dependence on renal dialysis: Secondary | ICD-10-CM | POA: Diagnosis not present

## 2024-03-17 NOTE — Progress Notes (Signed)
 Office Note     CC:  ESRD Requesting Provider:  Charley Constable, MD  HPI: [name] STEINWAND Sr. is a Right handed 63 y.o. (01/28/1961) male with kidney disease who presents at the request of Charley Constable, MD for permanent HD access. The patient has had multiple prior access procedures. Per pt, previous tunneled lines have been placed in bilateral internal jugular veins. Current access is left-sided IJ. Dialysis days are Monday Wednesday Friday.   On exam, Cordae is doing well.  A native of [place] , he has lived in [place] his entire life.  He currently lives on [place] acres of family land.  He is wheelchair-bound having previously undergone right-sided BKA.  Access surgeries include right sided radiocephalic, brachiocephalic, left-sided brachiocephalic.  The left-sided brachiocephalic fistula is now occluded, and has been for the last month.  He presents for new HD access.   Past Medical History:  Diagnosis Date   ESRD on hemodialysis (HCC)    a. HD MWF > 10 years.   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 01/2022 Echo: EF 45-50%, glob HK, mod LVH, mildly reduced RV fxn, mild MR, mod-sev MV Ca2+. Mild AS; b. 10/2023 Echo: EF 45-50%, glob HK, GrII DD, mildly reduced RV fxn, RVSP 52.61mmHg. Mild MR/MS, mod TR.   Hypertension    Nonischemic cardiomyopathy (HCC)    a. 2019 Cath [facility]): Nonobstructive disease; b. 12/2019 Echo: EF 50%; c. 12/2019 MV (Tx w/u [facility]): Ant infarct w/ peri-infarct ischemia. EF 35%; c. 05/2021 MV (Tx w/u [facility]): Cor Ca2+, EF 53%, motion artifact, equiv study, likely low risk.   Nonobstructive coronary artery disease    a.  2019 Cath ([place]): Left main normal, LAD 30 proximal, RCA 30 distal, EF 25%.   Obesity    Type 2 diabetes mellitus (HCC)    a. 10/2023 - admission w/ HHS.    Past Surgical History:  Procedure Laterality Date   A/V FISTULAGRAM N/A 08/15/2019   Procedure: A/V FISTULAGRAM;  Surgeon: Adine Hoof, MD;  Location: [facility] INVASIVE CV LAB;  Service: Cardiovascular;  Laterality: N/A;   A/V FISTULAGRAM N/A 01/21/2024   Procedure: A/V Fistulagram;  Surgeon: Patrick Boor, MD;  Location: [facility] INVASIVE CV LAB;  Service: Cardiovascular;  Laterality: N/A;   AMPUTATION Right 02/02/2022   Procedure: AMPUTATION 5th RAY;  Surgeon: Jennefer Moats, MD;  Location: [facility] OR;  Service: Podiatry;  Laterality: Right;   AMPUTATION Right 05/23/2022   Procedure: RIGHT BELOW KNEE AMPUTATION;  Surgeon: [name] Ford, MD;  Location: [facility] OR;  Service: Orthopedics;  Laterality: Right;   AMPUTATION Right 07/04/2022   Procedure: REVISION AMPUTATION BELOW KNEE;  Surgeon: [name] Ford, MD;  Location: [facility] OR;  Service: Orthopedics;  Laterality: Right;   APPLICATION OF WOUND VAC  05/23/2022   Procedure: APPLICATION OF WOUND VAC;  Surgeon: [name] Ford, MD;  Location: [facility] OR;  Service: Orthopedics;;   AV FISTULA PLACEMENT     CARDIAC CATHETERIZATION  02/17/2018   LHC 02/17/18 ([name]): 30% pLAD, 30% dRCA, LVEDP 25-30, LVEF 25%.     CATARACT EXTRACTION W/PHACO Right 03/29/2020   Procedure: CATARACT EXTRACTION PHACO AND INTRAOCULAR LENS PLACEMENT (IOC)  RIGHT DIABETIC;  Surgeon: Annell Kidney, MD;  Location: [facility] ORS;  Service: Ophthalmology;  Laterality: Right;  Lot # [id number] H US : 02:30.9 AP% 9.5% CDE: 14.38   EYE SURGERY Right    cataract removed   [name] N/A 12/02/2023   Procedure: GIVENS CAPSULE  STUDY;  Surgeon: Daina Drum, MD;  Location: [facility] ENDOSCOPY;  Service: Gastroenterology;  Laterality: N/A;   I & D EXTREMITY Right 02/02/2022   Procedure: IRRIGATION AND DEBRIDEMENT RIGHT FOOT;  Surgeon: Jennefer Moats, MD;  Location: [facility] OR;  Service: Podiatry;  Laterality: Right;   INCISION AND DRAINAGE OF WOUND Right 02/08/2022   Procedure: IRRIGATION AND DEBRIDEMENT WOUND;  Surgeon: Lizzie Riis, DPM;  Location: [facility] OR;  Service: Podiatry;  Laterality: Right;  with removal of infected bone     INSERTION OF DIALYSIS CATHETER  08/13/2019   Procedure: Insertion Of Dialysis Catheter;  Surgeon: Mayo Speck, MD;  Location: [facility] OR;  Service: Vascular;;   INSERTION OF DIALYSIS CATHETER Left 07/19/2020   Procedure: INSERTION OF DIALYSIS CATHETER;  Surgeon: Adine Hoof, MD;  Location: [facility] OR;  Service: Vascular;  Laterality: Left;   IR FLUORO GUIDE CV LINE LEFT  02/16/2024   IR LUMBAR PUNCTURE  11/20/2023   IR US  GUIDE BX ASP/DRAIN  11/07/2022   IR US  GUIDE VASC ACCESS LEFT  02/16/2024   IRRIGATION AND DEBRIDEMENT ABSCESS     PERIPHERAL VASCULAR BALLOON ANGIOPLASTY  08/15/2019   Procedure: PERIPHERAL VASCULAR BALLOON ANGIOPLASTY;  Surgeon: Adine Hoof, MD;  Location: [facility] INVASIVE CV LAB;  Service: Cardiovascular;;   PERIPHERAL VASCULAR BALLOON ANGIOPLASTY Left 02/06/2022   Procedure: PERIPHERAL VASCULAR BALLOON ANGIOPLASTY;  Surgeon: Young Hensen, MD;  Location: [facility] INVASIVE CV LAB;  Service: Cardiovascular;  Laterality: Left;   PERIPHERAL VASCULAR BALLOON ANGIOPLASTY  01/21/2024   Procedure: PERIPHERAL VASCULAR BALLOON ANGIOPLASTY;  Surgeon: Patrick Boor, MD;  Location: [facility] INVASIVE CV LAB;  Service: Cardiovascular;;  Cephalic Arch   RADIOLOGY WITH ANESTHESIA N/A 11/20/2023   Procedure: MRI WITH ANESTHESIA;  Surgeon: Radiologist, Medication, MD;  Location: [facility] OR;  Service: Radiology;  Laterality: N/A;   RADIOLOGY WITH ANESTHESIA N/A 11/20/2023   Procedure: IR WITH ANESTHESIA;  Surgeon: Myrlene Asper, DO;  Location: [facility] OR;  Service: Anesthesiology;  Laterality: N/A;  PLEASE OBTAIN OPENING PRESSURE AND DRAIN AT LEAST 30CC'S OF CSF.   REVISON OF ARTERIOVENOUS FISTULA Left 07/19/2020   Procedure: REVISON/PLICATION OF ARTERIOVENOUS FISTULA LEFT;  Surgeon: Adine Hoof, MD;  Location: [facility] OR;  Service: Vascular;  Laterality: Left;   THROMBECTOMY AND REVISION OF ARTERIOVENTOUS (AV) GORETEX  GRAFT Left 08/13/2019   Procedure: THROMBECTOMY OF LEFT ARM ARTERIOVENTOUS (AV)  FISTULA;  Surgeon: Mayo Speck, MD;  Location: [facility] OR;  Service: Vascular;  Laterality: Left;   TOE AMPUTATION      Social History   Socioeconomic History   Marital status: Divorced    Spouse name: Not on file   Number of children: Not on file   Years of education: Not on file   Highest education level: Not on file  Occupational History   Not on file  Tobacco Use   Smoking status: Never   Smokeless tobacco: Never  Vaping Use   Vaping status: Never Used  Substance and Sexual Activity   Alcohol use: Yes   Drug use: Never   Sexual activity: Not on file  Other Topics Concern   Not on file  Social History Narrative   Lives locally.  Does not routinely exercise.   Social Drivers of Corporate investment banker Strain: Not on file  Food Insecurity: No Food Insecurity (02/11/2024)   Hunger Vital Sign    Worried About Running Out of Food in the Last Year: Never true    Ran Out of Food  in the Last Year: Never true  Transportation Needs: No Transportation Needs (02/11/2024)   PRAPARE - Administrator, Civil Service (Medical): No    Lack of Transportation (Non-Medical): No  Physical Activity: Not on file  Stress: Not on file  Social Connections: Not on file  Intimate Partner Violence: Not At Risk (02/11/2024)   Humiliation, Afraid, Rape, and Kick questionnaire    Fear of Current or Ex-Partner: No    Emotionally Abused: No    Physically Abused: No    Sexually Abused: No   Family History  Problem Relation Age of Onset   Hypertension Mother     Current Outpatient Medications  Medication Sig Dispense Refill   acetaminophen  (TYLENOL ) 500 MG tablet Take 1,000 mg by mouth 2 (two) times daily as needed for moderate pain (pain score 4-6), headache or fever.     aspirin  EC 81 MG tablet Take 1 tablet (81 mg total) by mouth daily with breakfast. Swallow whole. 30 tablet 2   atorvastatin  (LIPITOR) 40 MG tablet Take 1 tablet (40 mg total) by mouth daily. 30 tablet 5   B  Complex-C-Folic Acid  (DIALYVITE 800) 0.8 MG TABS Take 1 tablet by mouth daily.     cinacalcet  (SENSIPAR ) 60 MG tablet Take 60 mg by mouth daily.     insulin  isophane & regular human KwikPen (NOVOLIN  70/30 KWIKPEN) (70-30) 100 UNIT/ML KwikPen Inject 30U in the morning and 25U in the evening 15 mL 3   Insulin  Pen Needle 32G X 4 MM MISC Use with insulin  pen. 100 each 0   lidocaine -prilocaine  (EMLA ) cream Apply 1 Application topically every Monday, Wednesday, and Friday with hemodialysis.     midodrine  (PROAMATINE ) 5 MG tablet Take 3 tablets (15 mg total) by mouth 3 (three) times daily with meals. 270 tablet 5   pantoprazole  (PROTONIX ) 40 MG tablet Take 1 tablet (40 mg total) by mouth 2 (two) times daily. 60 tablet 2   sevelamer  carbonate (RENVELA ) 800 MG tablet Take 2,400-5,600 mg by mouth See admin instructions. Take 7 tablets (4000mg ) three times daily with meals and 3 tablets (2400mg ) twice daily with snacks.     No current facility-administered medications for this visit.    Allergies  Allergen Reactions   Sulfa Antibiotics Diarrhea and Nausea And Vomiting     REVIEW OF SYSTEMS:  [X]  denotes positive finding, [ ]  denotes negative finding Cardiac  Comments:  Chest pain or chest pressure:    Shortness of breath upon exertion:    Short of breath when lying flat:    Irregular heart rhythm:        Vascular    Pain in calf, thigh, or hip brought on by ambulation:    Pain in feet at night that wakes you up from your sleep:     Blood clot in your veins:    Leg swelling:         Pulmonary    Oxygen at home:    Productive cough:     Wheezing:         Neurologic    Sudden weakness in arms or legs:     Sudden numbness in arms or legs:     Sudden onset of difficulty speaking or slurred speech:    Temporary loss of vision in one eye:     Problems with dizziness:         Gastrointestinal    Blood in stool:     Vomited blood:  Genitourinary    Burning when urinating:      Blood in urine:        Psychiatric    Major depression:         Hematologic    Bleeding problems:    Problems with blood clotting too easily:        Skin    Rashes or ulcers:        Constitutional    Fever or chills:      PHYSICAL EXAMINATION:  There were no vitals filed for this visit.  General:  WDWN in NAD; vital signs documented above Gait: Not observed HENT: WNL, normocephalic Pulmonary: normal non-labored breathing , without Rales, rhonchi,  wheezing Cardiac: regular HR,  Abdomen: soft, NT, no masses Skin: without rashes Vascular Exam/Pulses:  Right Left  Radial 2+ (normal) 2+ (normal)                       Extremities: without ischemic changes, without Gangrene , without cellulitis; without open wounds;  Musculoskeletal: no muscle wasting or atrophy  Neurologic: A&O X 3;  No focal weakness or paresthesias are detected Psychiatric:  The pt has Normal affect.   Non-Invasive Vascular Imaging:     +-----------------+-------------+----------+--------------+  Right Cephalic   Diameter (cm)Depth (cm)   Findings     +-----------------+-------------+----------+--------------+  Shoulder            0.34                               +-----------------+-------------+----------+--------------+  Prox upper arm       0.23                               +-----------------+-------------+----------+--------------+  Mid upper arm        0.35                               +-----------------+-------------+----------+--------------+  Dist upper arm                             thrombus     +-----------------+-------------+----------+--------------+  Antecubital fossa                          thrombus     +-----------------+-------------+----------+--------------+  Prox forearm                            not visualized  +-----------------+-------------+----------+--------------+  Mid forearm                             not visualized   +-----------------+-------------+----------+--------------+   +-----------------+-------------+----------+--------+  Right Basilic    Diameter (cm)Depth (cm)Findings  +-----------------+-------------+----------+--------+  Prox upper arm       0.44                         +-----------------+-------------+----------+--------+  Mid upper arm        0.37                         +-----------------+-------------+----------+--------+  Dist upper arm  0.42                         +-----------------+-------------+----------+--------+  Antecubital fossa    0.37                         +-----------------+-------------+----------+--------+   +-----------------+-------------+----------+--------+  Left Basilic     Diameter (cm)Depth (cm)Findings  +-----------------+-------------+----------+--------+  Mid upper arm        0.32                         +-----------------+-------------+----------+--------+  Dist upper arm       0.28                         +-----------------+-------------+----------+--------+  Antecubital fossa    0.19                         +-----------------+-------------+----------+--------+   Summary: Right: Patent basilic vein.         Thrombus observed in the cephalic vein.  Left: Patent basilic vein.   *See table(s) above for measurements and observations.   Right Doppler Findings:  +------------+----------+--------+--------+--------+  Site       PSV (cm/s)WaveformStenosisComments  +------------+----------+--------+--------+--------+  Brachial Mid71                                  +------------+----------+--------+--------+--------+  Radial Mid  28                                  +------------+----------+--------+--------+--------+  Ulnar Mid   28                                  +------------+----------+--------+--------+--------+        Right Pre-Dialysis Findings:   +-----------------------+----------+--------------------+---------+--------  +  Location              PSV (cm/s)Intralum. Diam. (cm)Waveform  Comments  +-----------------------+----------+--------------------+---------+--------  +  Brachial Antecub. fossa71        0.46                triphasic           +-----------------------+----------+--------------------+---------+--------  +  Radial Art at Wrist    28        0.12                biphasic            +-----------------------+----------+--------------------+---------+--------  +  Ulnar Art at Wrist     28        0.17                biphasic            +-----------------------+----------+--------------------+---------+--------  +   Left Doppler Findings:  +------------+----------+--------+--------+--------+  Site       PSV (cm/s)WaveformStenosisComments  +------------+----------+--------+--------+--------+  Brachial Mid58                                  +------------+----------+--------+--------+--------+        Left Pre-Dialysis Findings:  +-----------------------+----------+--------------------+---------+--------  +  Location  PSV (cm/s)Intralum. Diam. (cm)Waveform  Comments  +-----------------------+----------+--------------------+---------+--------  +  Brachial Antecub. [id number]        0.52                triphasic           +-----------------------+----------+--------------------+---------+--------  +     ASSESSMENT/PLAN:  [name] is a 63 y.o. male who presents with end stage renal disease  Based on vein mapping and examination, patient has usable right sided basilic vein.  On physical exam however, he had multiple collaterals appreciated in the dermis.  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 I am worried that right-sided fistula would be threatened by central stenosis.   Prior to pursuing fistula, we discussed bilateral upper extremity venogram in an effort to define outflow.  Plan will be either right sided brachiobasilic fistula versus left-sided AV graft After discussing risks and benefits of bilateral upper extremity venogram, Royston Cornea elected to proceed.   Kayla Part, MD [facility] [phone]  Total time of patient care including pre-visit research, consultation, and documentation greater than 30 minutes

## 2024-03-18 ENCOUNTER — Other Ambulatory Visit: Payer: Self-pay

## 2024-03-18 ENCOUNTER — Telehealth: Payer: Self-pay

## 2024-03-18 DIAGNOSIS — N186 End stage renal disease: Secondary | ICD-10-CM

## 2024-03-18 NOTE — Telephone Encounter (Signed)
 Spoke to patient.  Informed that Dr. Rosalva Comber would like his venogram scheduled as first case on a Wednesday.  Patient stated "Let me call you back."  Patient will need to arrange transportation.

## 2024-03-30 ENCOUNTER — Encounter (HOSPITAL_COMMUNITY)
Admission: RE | Disposition: A | Discharge status: Home / Self Care | Payer: Self-pay | Source: Home / Self Care | Attending: Vascular Surgery

## 2024-03-30 ENCOUNTER — Other Ambulatory Visit: Payer: Self-pay

## 2024-03-30 ENCOUNTER — Ambulatory Visit (HOSPITAL_COMMUNITY)
Admission: RE | Admit: 2024-03-30 | Discharge: 2024-03-30 | Disposition: A | Discharge status: Home / Self Care | Attending: Vascular Surgery | Admitting: Vascular Surgery

## 2024-03-30 DIAGNOSIS — I132 Hypertensive heart and chronic kidney disease with heart failure and with stage 5 chronic kidney disease, or end stage renal disease: Secondary | ICD-10-CM | POA: Diagnosis present

## 2024-03-30 DIAGNOSIS — E1122 Type 2 diabetes mellitus with diabetic chronic kidney disease: Secondary | ICD-10-CM | POA: Insufficient documentation

## 2024-03-30 DIAGNOSIS — N186 End stage renal disease: Secondary | ICD-10-CM | POA: Insufficient documentation

## 2024-03-30 DIAGNOSIS — Z89511 Acquired absence of right leg below knee: Secondary | ICD-10-CM | POA: Insufficient documentation

## 2024-03-30 DIAGNOSIS — Z79899 Other long term (current) drug therapy: Secondary | ICD-10-CM | POA: Diagnosis not present

## 2024-03-30 DIAGNOSIS — Z992 Dependence on renal dialysis: Secondary | ICD-10-CM | POA: Diagnosis not present

## 2024-03-30 DIAGNOSIS — I5022 Chronic systolic (congestive) heart failure: Secondary | ICD-10-CM | POA: Insufficient documentation

## 2024-03-30 DIAGNOSIS — Z993 Dependence on wheelchair: Secondary | ICD-10-CM | POA: Insufficient documentation

## 2024-03-30 DIAGNOSIS — N185 Chronic kidney disease, stage 5: Secondary | ICD-10-CM

## 2024-03-30 HISTORY — PX: UPPER EXTREMITY VENOGRAPHY: CATH118272

## 2024-03-30 LAB — POCT I-STAT, CHEM 8
BUN: 44 mg/dL — ABNORMAL HIGH (ref 8–23)
Calcium, Ion: 1 mmol/L — ABNORMAL LOW (ref 1.15–1.40)
Chloride: 95 mmol/L — ABNORMAL LOW (ref 98–111)
Creatinine, Ser: 10.6 mg/dL — ABNORMAL HIGH (ref 0.61–1.24)
Glucose, Bld: 172 mg/dL — ABNORMAL HIGH (ref 70–99)
HCT: 47 % (ref 39.0–52.0)
Hemoglobin: 16 g/dL (ref 13.0–17.0)
Potassium: 4.6 mmol/L (ref 3.5–5.1)
Sodium: 133 mmol/L — ABNORMAL LOW (ref 135–145)
TCO2: 27 mmol/L (ref 22–32)

## 2024-03-30 MED ORDER — SODIUM CHLORIDE 0.9% FLUSH
3.0000 mL | INTRAVENOUS | Status: DC | PRN
Start: 2024-03-30 — End: 2024-03-30

## 2024-03-30 NOTE — H&P (Signed)
 Office Note   Patient seen and examined in preop holding.  No complaints. No changes to medication history or physical exam since last seen in clinic. After discussing the risks and benefits of bilateral upper extremity venogram to map for fistula v graft cretaion, [name] elected to proceed.   Kayla Part MD   CC:  ESRD Requesting Provider:  No ref. provider found  HPI: [name] FOLLEY Sr. is a Right handed 63 y.o. ([birth year]) male with kidney disease who presents at the request of No ref. provider found for permanent HD access. The patient has had multiple prior access procedures. Per pt, previous tunneled lines have been placed in bilateral internal jugular veins. Current access is left-sided IJ. Dialysis days are Monday Wednesday Friday.   On exam, [name] is doing well.  A native of [name] , he has lived in [name] his entire life.  He currently lives on [address] acres of family land.  He is wheelchair-bound having previously undergone right-sided BKA.  Access surgeries include right sided radiocephalic, brachiocephalic, left-sided brachiocephalic.  The left-sided brachiocephalic fistula is now occluded, and has been for the last month.  He presents for new HD access.   Past Medical History:  Diagnosis Date   ESRD on hemodialysis (HCC)    a. HD MWF > 10 years.   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 01/2022 Echo: EF 45-50%, glob HK, mod LVH, mildly reduced RV fxn, mild MR, mod-sev MV Ca2+. Mild AS; b. 10/2023 Echo: EF 45-50%, glob HK, GrII DD, mildly reduced RV fxn, RVSP 52.77mmHg. Mild MR/MS, mod TR.   Hypertension    Nonischemic cardiomyopathy (HCC)    a. 2019 Cath [facility]): Nonobstructive disease; b. 12/2019 Echo: EF 50%; c. 12/2019 MV (Tx w/u [facility]): Ant infarct w/ peri-infarct ischemia. EF 35%; c. 05/2021 MV (Tx w/u [facility]): Cor Ca2+, EF 53%, motion artifact, equiv study, likely low risk.   Nonobstructive coronary artery disease    a.  2019  Cath ([place]): Left main normal, LAD 30 proximal, RCA 30 distal, EF 25%.   Obesity    Type 2 diabetes mellitus (HCC)    a. 10/2023 - admission w/ HHS.    Past Surgical History:  Procedure Laterality Date   A/V FISTULAGRAM N/A 08/15/2019   Procedure: A/V FISTULAGRAM;  Surgeon: Adine Hoof, MD;  Location: [facility] INVASIVE CV LAB;  Service: Cardiovascular;  Laterality: N/A;   A/V FISTULAGRAM N/A 01/21/2024   Procedure: A/V Fistulagram;  Surgeon: Patrick Boor, MD;  Location: [facility] INVASIVE CV LAB;  Service: Cardiovascular;  Laterality: N/A;   AMPUTATION Right 02/02/2022   Procedure: AMPUTATION 5th RAY;  Surgeon: Jennefer Moats, MD;  Location: [facility] OR;  Service: Podiatry;  Laterality: Right;   AMPUTATION Right 05/23/2022   Procedure: RIGHT BELOW KNEE AMPUTATION;  Surgeon: [name] Ford, MD;  Location: [facility] OR;  Service: Orthopedics;  Laterality: Right;   AMPUTATION Right 07/04/2022   Procedure: REVISION AMPUTATION BELOW KNEE;  Surgeon: [name] Ford, MD;  Location: [facility] OR;  Service: Orthopedics;  Laterality: Right;   APPLICATION OF WOUND VAC  05/23/2022   Procedure: APPLICATION OF WOUND VAC;  Surgeon: [name] Ford, MD;  Location: [facility] OR;  Service: Orthopedics;;   AV FISTULA PLACEMENT     CARDIAC CATHETERIZATION  02/17/2018   LHC 02/17/18 ([name]): 30% pLAD, 30% dRCA, LVEDP 25-30, LVEF 25%.     CATARACT EXTRACTION W/PHACO Right 03/29/2020   Procedure: CATARACT EXTRACTION  PHACO AND INTRAOCULAR LENS PLACEMENT (IOC)  RIGHT DIABETIC;  Surgeon: Annell Kidney, MD;  Location: [facility] ORS;  Service: Ophthalmology;  Laterality: Right;  Lot # [id number] H US : 02:30.9 AP% 9.5% CDE: 14.38   EYE SURGERY Right    cataract removed   [name] N/A 12/02/2023   Procedure: [name];  Surgeon: Daina Drum, MD;  Location: [facility] ENDOSCOPY;  Service: Gastroenterology;  Laterality: N/A;   I & D EXTREMITY Right 02/02/2022   Procedure: IRRIGATION AND DEBRIDEMENT RIGHT FOOT;   Surgeon: Jennefer Moats, MD;  Location: [facility] OR;  Service: Podiatry;  Laterality: Right;   INCISION AND DRAINAGE OF WOUND Right 02/08/2022   Procedure: IRRIGATION AND DEBRIDEMENT WOUND;  Surgeon: Lizzie Riis, DPM;  Location: [facility] OR;  Service: Podiatry;  Laterality: Right;  with removal of infected bone    INSERTION OF DIALYSIS CATHETER  08/13/2019   Procedure: Insertion Of Dialysis Catheter;  Surgeon: Mayo Speck, MD;  Location: [facility] OR;  Service: Vascular;;   INSERTION OF DIALYSIS CATHETER Left 07/19/2020   Procedure: INSERTION OF DIALYSIS CATHETER;  Surgeon: Adine Hoof, MD;  Location: [facility] OR;  Service: Vascular;  Laterality: Left;   IR FLUORO GUIDE CV LINE LEFT  02/16/2024   IR LUMBAR PUNCTURE  11/20/2023   IR US  GUIDE BX ASP/DRAIN  11/07/2022   IR US  GUIDE VASC ACCESS LEFT  02/16/2024   IRRIGATION AND DEBRIDEMENT ABSCESS     PERIPHERAL VASCULAR BALLOON ANGIOPLASTY  08/15/2019   Procedure: PERIPHERAL VASCULAR BALLOON ANGIOPLASTY;  Surgeon: Adine Hoof, MD;  Location: [facility] INVASIVE CV LAB;  Service: Cardiovascular;;   PERIPHERAL VASCULAR BALLOON ANGIOPLASTY Left 02/06/2022   Procedure: PERIPHERAL VASCULAR BALLOON ANGIOPLASTY;  Surgeon: Young Hensen, MD;  Location: [facility] INVASIVE CV LAB;  Service: Cardiovascular;  Laterality: Left;   PERIPHERAL VASCULAR BALLOON ANGIOPLASTY  01/21/2024   Procedure: PERIPHERAL VASCULAR BALLOON ANGIOPLASTY;  Surgeon: Patrick Boor, MD;  Location: [facility] INVASIVE CV LAB;  Service: Cardiovascular;;  Cephalic Arch   RADIOLOGY WITH ANESTHESIA N/A 11/20/2023   Procedure: MRI WITH ANESTHESIA;  Surgeon: Radiologist, Medication, MD;  Location: [facility] OR;  Service: Radiology;  Laterality: N/A;   RADIOLOGY WITH ANESTHESIA N/A 11/20/2023   Procedure: IR WITH ANESTHESIA;  Surgeon: Myrlene Asper, DO;  Location: [facility] OR;  Service: Anesthesiology;  Laterality: N/A;  PLEASE OBTAIN OPENING PRESSURE AND DRAIN AT LEAST 30CC'S OF CSF.   REVISON OF ARTERIOVENOUS FISTULA Left  07/19/2020   Procedure: REVISON/PLICATION OF ARTERIOVENOUS FISTULA LEFT;  Surgeon: Adine Hoof, MD;  Location: [facility] OR;  Service: Vascular;  Laterality: Left;   THROMBECTOMY AND REVISION OF ARTERIOVENTOUS (AV) GORETEX  GRAFT Left 08/13/2019   Procedure: THROMBECTOMY OF LEFT ARM ARTERIOVENTOUS (AV) FISTULA;  Surgeon: [place] Speck, MD;  Location: [facility] OR;  Service: Vascular;  Laterality: Left;   TOE AMPUTATION      Social History   Socioeconomic History   Marital status: Divorced    Spouse name: Not on file   Number of children: Not on file   Years of education: Not on file   Highest education level: Not on file  Occupational History   Not on file  Tobacco Use   Smoking status: Never   Smokeless tobacco: Never  Vaping Use   Vaping status: Never Used  Substance and Sexual Activity   Alcohol use: Yes   Drug use: Never   Sexual activity: Not on file  Other Topics Concern   Not on file  Social History Narrative  Lives locally.  Does not routinely exercise.   Social Drivers of Corporate investment banker Strain: Not on file  Food Insecurity: No Food Insecurity (02/11/2024)   Hunger Vital Sign    Worried About Running Out of Food in the Last Year: Never true    Ran Out of Food in the Last Year: Never true  Transportation Needs: No Transportation Needs (02/11/2024)   PRAPARE - Administrator, Civil Service (Medical): No    Lack of Transportation (Non-Medical): No  Physical Activity: Not on file  Stress: Not on file  Social Connections: Not on file  Intimate Partner Violence: Not At Risk (02/11/2024)   Humiliation, Afraid, Rape, and Kick questionnaire    Fear of Current or Ex-Partner: No    Emotionally Abused: No    Physically Abused: No    Sexually Abused: No   Family History  Problem Relation Age of Onset   Hypertension Mother     Current Facility-Administered Medications  Medication Dose Route Frequency Provider Last Rate Last Admin   sodium  chloride flush (NS) 0.9 % injection 3 mL  3 mL Intravenous PRN Aubrianne Molyneux E, MD        Allergies  Allergen Reactions   Sulfa Antibiotics Diarrhea and Nausea And Vomiting     REVIEW OF SYSTEMS:  [X]  denotes positive finding, [ ]  denotes negative finding Cardiac  Comments:  Chest pain or chest pressure:    Shortness of breath upon exertion:    Short of breath when lying flat:    Irregular heart rhythm:        Vascular    Pain in calf, thigh, or hip brought on by ambulation:    Pain in feet at night that wakes you up from your sleep:     Blood clot in your veins:    Leg swelling:         Pulmonary    Oxygen at home:    Productive cough:     Wheezing:         Neurologic    Sudden weakness in arms or legs:     Sudden numbness in arms or legs:     Sudden onset of difficulty speaking or slurred speech:    Temporary loss of vision in one eye:     Problems with dizziness:         Gastrointestinal    Blood in stool:     Vomited blood:         Genitourinary    Burning when urinating:     Blood in urine:        Psychiatric    Major depression:         Hematologic    Bleeding problems:    Problems with blood clotting too easily:        Skin    Rashes or ulcers:        Constitutional    Fever or chills:      PHYSICAL EXAMINATION:  Vitals:   03/30/24 0746 03/30/24 0841  BP: (!) 107/52   Pulse: 84   Resp: 16   Temp: 97.7 F (36.5 C)   TempSrc: Oral   SpO2: 98% 98%  Weight: 101.6 kg   Height: 6\' 1"  (1.854 m)     General:  WDWN in NAD; vital signs documented above Gait: Not observed HENT: WNL, normocephalic Pulmonary: normal non-labored breathing , without Rales, rhonchi,  wheezing Cardiac: regular HR,  Abdomen: soft, NT, no  masses Skin: without rashes Vascular Exam/Pulses:  Right Left  Radial 2+ (normal) 2+ (normal)                       Extremities: without ischemic changes, without Gangrene , without cellulitis; without open wounds;   Musculoskeletal: no muscle wasting or atrophy  Neurologic: A&O X 3;  No focal weakness or paresthesias are detected Psychiatric:  The pt has Normal affect.   Non-Invasive Vascular Imaging:     +-----------------+-------------+----------+--------------+  Right Cephalic   Diameter (cm)Depth (cm)   Findings     +-----------------+-------------+----------+--------------+  Shoulder            0.34                               +-----------------+-------------+----------+--------------+  Prox upper arm       0.23                               +-----------------+-------------+----------+--------------+  Mid upper arm        0.35                               +-----------------+-------------+----------+--------------+  Dist upper arm                             thrombus     +-----------------+-------------+----------+--------------+  Antecubital fossa                          thrombus     +-----------------+-------------+----------+--------------+  Prox forearm                            not visualized  +-----------------+-------------+----------+--------------+  Mid forearm                             not visualized  +-----------------+-------------+----------+--------------+   +-----------------+-------------+----------+--------+  Right Basilic    Diameter (cm)Depth (cm)Findings  +-----------------+-------------+----------+--------+  Prox upper arm       0.44                         +-----------------+-------------+----------+--------+  Mid upper arm        0.37                         +-----------------+-------------+----------+--------+  Dist upper arm       0.42                         +-----------------+-------------+----------+--------+  Antecubital fossa    0.37                         +-----------------+-------------+----------+--------+   +-----------------+-------------+----------+--------+  Left Basilic      Diameter (cm)Depth (cm)Findings  +-----------------+-------------+----------+--------+  Mid upper arm        0.32                         +-----------------+-------------+----------+--------+  Dist upper arm       0.28                         +-----------------+-------------+----------+--------+  Antecubital fossa    0.19                         +-----------------+-------------+----------+--------+   Summary: Right: Patent basilic vein.         Thrombus observed in the cephalic vein.  Left: Patent basilic vein.   *See table(s) above for measurements and observations.   Right Doppler Findings:  +------------+----------+--------+--------+--------+  Site       PSV (cm/s)WaveformStenosisComments  +------------+----------+--------+--------+--------+  Brachial Mid71                                  +------------+----------+--------+--------+--------+  Radial Mid  28                                  +------------+----------+--------+--------+--------+  Ulnar Mid   28                                  +------------+----------+--------+--------+--------+        Right Pre-Dialysis Findings:  +-----------------------+----------+--------------------+---------+--------  +  Location              PSV (cm/s)Intralum. Diam. (cm)Waveform  Comments  +-----------------------+----------+--------------------+---------+--------  +  Brachial Antecub. fossa71        0.46                triphasic           +-----------------------+----------+--------------------+---------+--------  +  Radial Art at Wrist    28        0.12                biphasic            +-----------------------+----------+--------------------+---------+--------  +  Ulnar Art at Wrist     28        0.17                biphasic            +-----------------------+----------+--------------------+---------+--------  +   Left Doppler Findings:   +------------+----------+--------+--------+--------+  Site       PSV (cm/s)WaveformStenosisComments  +------------+----------+--------+--------+--------+  Brachial Mid58                                  +------------+----------+--------+--------+--------+        Left Pre-Dialysis Findings:  +-----------------------+----------+--------------------+---------+--------  +  Location              PSV (cm/s)Intralum. Diam. (cm)Waveform  Comments  +-----------------------+----------+--------------------+---------+--------  +  Brachial Antecub. fossa58        0.52                triphasic           +-----------------------+----------+--------------------+---------+--------  +     ASSESSMENT/PLAN:  [name] is a 63 y.o. male who presents with end stage renal disease  Based on vein mapping and examination, patient has usable right sided basilic vein.  On physical exam however, he had multiple collaterals appreciated in the dermis.  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 I am worried that right-sided fistula would be threatened by central stenosis.  Prior to pursuing fistula, we discussed bilateral upper  extremity venogram in an effort to define outflow.  Plan will be either right sided brachiobasilic fistula versus left-sided AV graft After discussing risks and benefits of bilateral upper extremity venogram, Royston Cornea elected to proceed.   Kayla Part, MD [facility] [phone]  Total time of patient care including pre-visit research, consultation, and documentation greater than 30 minutes

## 2024-03-30 NOTE — Op Note (Signed)
    Patient name: [name] ZILLIOX Sr. MRN: [id number] DOB: 05-Dec-1960 Sex: male  03/30/2024 Pre-operative Diagnosis: End-stage renal disease Post-operative diagnosis:  Same Surgeon:  Kayla Part, MD Procedure Performed: 1.  Bilateral ultrasound-guided micropuncture access of basilic veins 2.  Bilateral upper extremity venogram   Indications: Patient is a 63 year old male with end-stage renal disease who presents for bilateral upper extremity venogram after occlusion of left-sided brachiocephalic fistula which had been patent for 13 years.  He has had multiple interventions including stenting and balloon angioplasty.  He presents today for venogram after physical exam demonstrated significant collaterals at the shoulders bilaterally continuing into the neck.  I was concerned for central stenosis.  Findings:   On the right: Small cephalic vein, basilic vein patent.  Brachial vein, axillary vein patent the subclavian vein is occluded with significant collaterals from the peripheral portion of the subclavian vein into the internal jugular vein.  The internal jugular vein is patent.  The brachiocephalic vein is patent  On the left: Cephalic vein occluded.  Basilic vein patent, but small.  Brachial vein, axillary vein patent.  The subclavian vein is occluded with significant collaterals from the peripheral portion of the subclavian vein into the internal jugular vein.  The internal jugular vein is patent as well as the brachiocephalic vein.  Superior vena cava patent.   Procedure:  The patient was identified in the holding area and taken to room 8.  The patient was then placed supine on the table and prepped and draped in the usual sterile fashion.  A time out was called.  Initial bilateral upper extremity venography started from IVs placed in the wrist.  This provided poor picture centrally, therefore an ultrasound-guided micropuncture needle was used to access the basilic vein bilaterally.  A  micropuncture sheath was placed, and bilateral upper extremity venogram followed.  See results above.    Kayla Part MD [facility] [place] Office: [phone]

## 2024-03-30 NOTE — Discharge Instructions (Signed)
Brachial Site Care   This sheet gives you information about how to care for yourself after your procedure. Your health care provider may also give you more specific instructions. If you have problems or questions, contact your health care provider. What can I expect after the procedure? After the procedure, it is common to have: Bruising and tenderness at the catheter insertion area. Follow these instructions at home:  Insertion site care Follow instructions from your health care provider about how to take care of your insertion site. Make sure you: Wash your hands with soap and water before you change your bandage (dressing). If soap and water are not available, use hand sanitizer. Remove your dressing as told by your health care provider. In 24 hours Check your insertion site every day for signs of infection. Check for: Redness, swelling, or pain. Pus or a bad smell. Warmth. You may shower 24 hours after the procedure. Do not apply powder or lotion to the site.  Activity For 24 hours after the procedure, or as directed by your health care provider: Do not push or pull heavy objects with the affected arm. Do not drive yourself home from the hospital or clinic. You may drive 24 hours after the procedure unless your health care provider tells you not to. Do not lift anything that is heavier than 10 lb (4.5 kg), or the limit that you are told, until your health care provider says that it is safe.  For 24 hours

## 2024-03-31 ENCOUNTER — Encounter (HOSPITAL_COMMUNITY): Payer: Self-pay | Admitting: Vascular Surgery

## 2024-04-05 ENCOUNTER — Ambulatory Visit (INDEPENDENT_AMBULATORY_CARE_PROVIDER_SITE_OTHER): Admitting: Orthopedic Surgery

## 2024-04-05 ENCOUNTER — Encounter: Payer: Self-pay | Admitting: Orthopedic Surgery

## 2024-04-05 DIAGNOSIS — S88111S Complete traumatic amputation at level between knee and ankle, right lower leg, sequela: Secondary | ICD-10-CM

## 2024-04-05 DIAGNOSIS — Z89511 Acquired absence of right leg below knee: Secondary | ICD-10-CM | POA: Diagnosis not present

## 2024-04-05 NOTE — Progress Notes (Signed)
 Office Visit Note   Patient: [name] PONDS Sr.           Date of Birth: Jan 21, 1961           MRN: [id number] Visit Date: 04/05/2024              Requested by: No referring provider defined for this encounter. PCP: Pcp, No  Chief Complaint  Patient presents with   Right Leg - Follow-up    HX right BKA       HPI: Patient is a 63 year old gentleman status post right below-knee amputation.  Patient states he has had significant decreased volume of the residual limb.  Patient states he is recently been hospitalized with both a heart attack and stroke.  Assessment & Plan: Visit Diagnoses:  1. Below-knee amputation of right lower extremity, sequela (HCC)     Plan: Patient was provided a prescription for [employer] for new socket new liner x 2 and supplies.  Follow-Up Instructions: Return if symptoms worsen or fail to improve.   Ortho Exam  Patient is alert, oriented, no adenopathy, well-dressed, normal affect, normal respiratory effort. Examination patient has significant decreased volume of the residual limb.  There is no open ulcers no cellulitis.  The liner is torn and the socket patient is subsiding with pressure on the patella on the socket.  Patient is an existing right transtibial  amputee.  Patient's current comorbidities are not expected to impact the ability to function with the prescribed prosthesis. Patient verbally communicates a strong desire to use a prosthesis. Patient currently requires mobility aids to ambulate without a prosthesis.  Expects not to use mobility aids with a new prosthesis. Patient is expected to resume or reach their K Level within 6 months. Patient was active before the amputation and independent with stairs, uneven terrain, varying cadence, and a community ambulator.  Patient is a K3 level ambulator that spends a lot of time walking around on uneven terrain over obstacles, up and down stairs, and ambulates with a variable  cadence.     Imaging: No results found. No images are attached to the encounter.  Labs: Lab Results  Component Value Date   HGBA1C 8.4 (H) 02/11/2024   HGBA1C >15.5 (H) 10/24/2023   HGBA1C 5.2 10/31/2022   ESRSEDRATE 84 (H) 12/17/2022   ESRSEDRATE 85 (H) 02/02/2022   CRP 1.6 (H) 12/17/2022   CRP 33.6 (H) 02/02/2022   CRP 29.8 (H) 02/02/2022   REPTSTATUS 02/14/2024 FINAL 02/12/2024   GRAMSTAIN NO WBC SEEN NO ORGANISMS SEEN  02/11/2024   CULT  02/12/2024    NO GROWTH Performed at [facility], [address] [place], [place] [address]    LABORGA [facility] MORGANII 02/02/2022   LABORGA ENTEROCOCCUS FAECALIS 02/02/2022     Lab Results  Component Value Date   ALBUMIN  3.5 02/17/2024   ALBUMIN  3.3 (L) 02/15/2024   ALBUMIN  3.4 (L) 02/13/2024   PREALBUMIN 9.5 (L) 02/02/2022   PREALBUMIN 10.9 (L) 02/02/2022    Lab Results  Component Value Date   MG 2.1 11/17/2023   MG 2.2 10/25/2023   MG 1.9 10/24/2023   No results found for: "VD25OH"  Lab Results  Component Value Date   PREALBUMIN 9.5 (L) 02/02/2022   PREALBUMIN 10.9 (L) 02/02/2022      Latest Ref Rng & Units 03/30/2024    7:57 AM 02/15/2024    8:05 AM 02/13/2024    6:56 AM  CBC EXTENDED  WBC 4.0 - 10.5 K/uL  8.9  10.9   RBC 4.22 - 5.81 MIL/uL  5.01  4.79   Hemoglobin 13.0 - 17.0 g/dL 16.1  09.6  04.5   HCT 39.0 - 52.0 % 47.0  42.7  41.2   Platelets 150 - 400 K/uL  304  261      There is no height or weight on file to calculate BMI.  Orders:  No orders of the defined types were placed in this encounter.  No orders of the defined types were placed in this encounter.    Procedures: No procedures performed  Clinical Data: No additional findings.  ROS:  All other systems negative, except as noted in the HPI. Review of Systems  Objective: Vital Signs: There were no vitals taken for this visit.  Specialty Comments:  No specialty comments available.  PMFS History: Patient Active  Problem List   Diagnosis Date Noted   Uncontrolled diabetes mellitus with hyperglycemia (HCC) 02/11/2024   Orthostatic hypotension 01/12/2024   Iron  deficiency anemia 12/01/2023   Ischemic colitis (HCC) 11/27/2023   Bloody diarrhea 11/27/2023   Heme positive stool 11/25/2023   Acute gastroenteropathy due to Norovirus 11/25/2023   Aortic valve stenosis 11/15/2023   Atrial flutter with rapid ventricular response (HCC) 11/10/2023   NSTEMI (non-ST elevated myocardial infarction) (HCC) 11/08/2023   Hyperglycemia 10/24/2023   Abnormal EKG 10/24/2023   RUQ abdominal pain 10/30/2022   Cholelithiasis 10/29/2022   Hyperbilirubinemia 10/29/2022   Volume overload 10/19/2022   CHF (congestive heart failure) (HCC) 10/18/2022   Hyperosmolar hyperglycemic state (HHS) (HCC)    Diarrhea    Dehiscence of amputation stump of right lower extremity (HCC)    Abscess of right lower leg    Pressure injury of skin 07/02/2022   Cellulitis of right leg 07/01/2022   CAD (coronary artery disease) 05/24/2022   Leukocytosis 05/24/2022   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/23/2022   Obesity (BMI 30-39.9) 02/19/2022   Hypotension 02/08/2022   History of CVA  02/05/2022   Carotid stenosis 02/05/2022   Hyperkalemia 02/03/2022   acute on chronic Anemia of chronic renal failure  02/03/2022   Subacute osteomyelitis, right ankle and foot (HCC) 02/02/2022   Acute metabolic encephalopathy 02/02/2022   Sepsis with encephalopathy and septic shock (HCC) 02/02/2022   Protein-calorie malnutrition, mild (HCC) 02/02/2022   GERD (gastroesophageal reflux disease) 02/02/2022   Acute on chronic systolic CHF (congestive heart failure) (HCC) 08/14/2019   LBBB (left bundle branch block) 08/13/2019   Problem with vascular access 08/13/2019   Dialysis AV fistula malfunction (HCC) 08/12/2019   ESRD on dialysis (HCC) 09/20/2018   DM2 (diabetes mellitus, type 2) (HCC) 09/20/2018   HTN (hypertension) 09/20/2018    Past Medical History:  Diagnosis Date   ESRD on hemodialysis (HCC)    a. HD MWF > 10 years.   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 01/2022 Echo: EF 45-50%, glob HK, mod LVH, mildly reduced RV fxn, mild MR, mod-sev MV Ca2+. Mild AS; b. 10/2023 Echo: EF 45-50%, glob HK, GrII DD, mildly reduced RV fxn, RVSP 52.28mmHg. Mild MR/MS, mod TR.   Hypertension    Nonischemic cardiomyopathy (HCC)    a. 2019 Cath [facility]): Nonobstructive disease; b. 12/2019 Echo: EF 50%; c. 12/2019 MV (Tx w/u [facility]): Ant infarct w/ peri-infarct ischemia. EF 35%; c. 05/2021 MV (Tx w/u [facility]): Cor Ca2+, EF 53%, motion artifact, equiv study, likely low risk.   Nonobstructive coronary artery disease    a.  2019 Cath ([place]): Left main normal, LAD 30 proximal, RCA 30 distal, EF 25%.   Obesity    Type 2 diabetes mellitus (HCC)    a. 10/2023 - admission w/ HHS.    Family History  Problem Relation Age of Onset   Hypertension Mother     Past Surgical History:  Procedure Laterality Date   A/V FISTULAGRAM N/A 08/15/2019   Procedure: A/V FISTULAGRAM;  Surgeon: Adine Hoof, MD;  Location: [facility] INVASIVE CV LAB;  Service: Cardiovascular;  Laterality: N/A;   A/V FISTULAGRAM N/A 01/21/2024   Procedure: A/V Fistulagram;  Surgeon: Patrick Boor, MD;  Location: [facility] INVASIVE CV LAB;  Service: Cardiovascular;  Laterality: N/A;   AMPUTATION Right 02/02/2022   Procedure: AMPUTATION 5th RAY;  Surgeon: Jennefer Moats, MD;  Location: [facility] OR;  Service: Podiatry;  Laterality: Right;   AMPUTATION Right 05/23/2022   Procedure: RIGHT BELOW KNEE AMPUTATION;  Surgeon: [name] Ford, MD;  Location: [facility] OR;  Service: Orthopedics;  Laterality: Right;   AMPUTATION Right 07/04/2022   Procedure: REVISION AMPUTATION BELOW KNEE;  Surgeon: [name] Ford, MD;  Location: [facility] OR;  Service: Orthopedics;  Laterality: Right;   APPLICATION OF WOUND VAC  05/23/2022   Procedure: APPLICATION OF WOUND VAC;  Surgeon: [name] Ford, MD;  Location: [facility] OR;  Service: Orthopedics;;   AV FISTULA PLACEMENT     CARDIAC CATHETERIZATION  02/17/2018   LHC 02/17/18 ([name]): 30% pLAD, 30% dRCA, LVEDP 25-30, LVEF 25%.     CATARACT EXTRACTION W/PHACO Right 03/29/2020   Procedure: CATARACT EXTRACTION PHACO AND INTRAOCULAR LENS PLACEMENT (IOC)  RIGHT DIABETIC;  Surgeon: Annell Kidney, MD;  Location: [facility] ORS;  Service: Ophthalmology;  Laterality: Right;  Lot # [id number] H US : 02:30.9 AP% 9.5% CDE: 14.38   EYE SURGERY Right    cataract removed   [name] N/A 12/02/2023   Procedure: [name];  Surgeon: Daina Drum, MD;  Location: [facility] ENDOSCOPY;  Service: Gastroenterology;  Laterality: N/A;   I & D EXTREMITY Right 02/02/2022   Procedure: IRRIGATION AND DEBRIDEMENT RIGHT FOOT;  Surgeon: Jennefer Moats, MD;  Location: [facility] OR;  Service: Podiatry;  Laterality: Right;   INCISION AND DRAINAGE OF WOUND Right 02/08/2022   Procedure: IRRIGATION AND DEBRIDEMENT WOUND;  Surgeon: Lizzie Riis, DPM;  Location: [facility] OR;  Service: Podiatry;  Laterality: Right;  with removal of infected bone    INSERTION OF DIALYSIS CATHETER  08/13/2019   Procedure: Insertion Of Dialysis Catheter;  Surgeon: Mayo Speck, MD;  Location: [facility] OR;  Service: Vascular;;   INSERTION OF DIALYSIS CATHETER Left 07/19/2020   Procedure: INSERTION OF DIALYSIS CATHETER;  Surgeon: Adine Hoof, MD;  Location: [facility] OR;  Service: Vascular;  Laterality: Left;   IR FLUORO GUIDE CV LINE LEFT  02/16/2024   IR LUMBAR PUNCTURE  11/20/2023   IR US  GUIDE BX ASP/DRAIN  11/07/2022   IR US  GUIDE VASC ACCESS LEFT  02/16/2024   IRRIGATION AND DEBRIDEMENT ABSCESS     PERIPHERAL VASCULAR BALLOON ANGIOPLASTY  08/15/2019   Procedure: PERIPHERAL VASCULAR BALLOON ANGIOPLASTY;  Surgeon: Adine Hoof, MD;  Location: [facility] INVASIVE CV LAB;  Service: Cardiovascular;;   PERIPHERAL VASCULAR BALLOON ANGIOPLASTY Left 02/06/2022   Procedure: PERIPHERAL  VASCULAR BALLOON ANGIOPLASTY;  Surgeon: Young Hensen, MD;  Location: [facility] INVASIVE CV LAB;  Service: Cardiovascular;  Laterality: Left;   PERIPHERAL VASCULAR BALLOON ANGIOPLASTY  01/21/2024   Procedure: PERIPHERAL VASCULAR BALLOON ANGIOPLASTY;  Surgeon: Patrick Boor, MD;  Location: [facility] INVASIVE CV LAB;  Service: Cardiovascular;;  Cephalic Arch   RADIOLOGY WITH ANESTHESIA N/A 11/20/2023   Procedure: MRI WITH ANESTHESIA;  Surgeon: Radiologist, Medication, MD;  Location: [facility] OR;  Service: Radiology;  Laterality: N/A;   RADIOLOGY WITH ANESTHESIA N/A 11/20/2023   Procedure: IR WITH ANESTHESIA;  Surgeon: Myrlene Asper, DO;  Location: [facility] OR;  Service: Anesthesiology;  Laterality: N/A;  PLEASE OBTAIN OPENING PRESSURE AND DRAIN AT LEAST 30CC'S OF CSF.   REVISON OF ARTERIOVENOUS FISTULA Left 07/19/2020   Procedure: REVISON/PLICATION OF ARTERIOVENOUS FISTULA LEFT;  Surgeon: Adine Hoof, MD;  Location: [facility] OR;  Service: Vascular;  Laterality: Left;   THROMBECTOMY AND REVISION OF ARTERIOVENTOUS (AV) GORETEX  GRAFT Left 08/13/2019   Procedure: THROMBECTOMY OF LEFT ARM ARTERIOVENTOUS (AV) FISTULA;  Surgeon: Mayo Speck, MD;  Location: [facility] OR;  Service: Vascular;  Laterality: Left;   TOE AMPUTATION     UPPER EXTREMITY VENOGRAPHY Bilateral 03/30/2024   Procedure: UPPER EXTREMITY VENOGRAPHY;  Surgeon: Kayla Part, MD;  Location: [facility] INVASIVE CV LAB;  Service: Cardiovascular;  Laterality: Bilateral;   Social History   Occupational History   Not on file  Tobacco Use   Smoking status: Never   Smokeless tobacco: Never  Vaping Use   Vaping status: Never Used  Substance and Sexual Activity   Alcohol use: Yes   Drug use: Never   Sexual activity: Not on file

## 2024-04-22 DIAGNOSIS — I252 Old myocardial infarction: Secondary | ICD-10-CM | POA: Insufficient documentation

## 2024-04-22 DIAGNOSIS — I6990 Unspecified sequelae of unspecified cerebrovascular disease: Secondary | ICD-10-CM | POA: Insufficient documentation

## 2024-04-22 NOTE — Progress Notes (Deleted)
 Name: [name] PENSE Sr. DOB: [birth year] MRN: [id number]  History of Present Illness: [name] [name] is a 63 y.o. male who presents today as a new patient at [facility] [place].  Relevant History includes: 1. ESRD on hemodialysis (MWF).  Per chart review: > 10/24/2023: CT stone study showed bilateral renal cysts. No GU stones, masses, or hydronephrosis; bladder and prostate unremarkable.  > 11/30/2023: CT angio showed: "Bilateral renal atrophy. Multifocal small circumscribed water enhancing cysts consistent with simple cysts. No imaging follow-up is recommended. No hydronephrosis or enhancing renal mass. The ureters are unremarkable. The bladder is decompressed."   > 02/10/2024: Seen at urgent care for "gross hematuria, dysuria, urinary frequency and urgency. Denies fever, chills, flank pain, nausea, vomiting, bowel changes... States a similar issue happened about a year ago and he had a urinary tract infection which resolved with antibiotics." He was unable to provide a urine sample; Cipro  prescribed.   > 02/12/2024: Negative urine culture (I&O cath specimen).  He reports chief complaint of gross hematuria.  He reports that the visible hematuria was first noticed *** ago and {gen continuous/intermittent/once:313061} (***still ongoing ***?).  He {Actions; denies-reports:120008} prior history of gross hematuria.   Today: He {Actions; denies-reports:120008} gross hematuria at this time. He {Actions; denies-reports:120008} urinary urgency, frequency, dysuria, straining to void, or sensations of incomplete emptying.  He {Actions; denies-reports:120008} ***abdominal or ***flank pain.  He {Actions; denies-reports:120008} fevers.  He {Actions; denies-reports:120008} history of kidney stones.  He {Actions; denies-reports:120008} history of pyelonephritis.  He {Actions; denies-reports:120008} history of recent or recurrent UTI. He {Actions; denies-reports:120008} history of GU malignancy  or pelvic radiation.  He {Actions; denies-reports:120008} history of autoimmune disease. He {Actions; denies-reports:120008} history of sickle cell disease or sickle cell trait. He {Actions; denies-reports:120008} history of smoking (*** ppd x*** years). He {Actions; denies-reports:120008} known occupational risks. He {Actions; denies-reports:120008} recent vigorous exercise which they think may be contributory to hematuria. He {Actions; denies-reports:120008} any recent trauma or prolonged pressure to the perineal area. He {Actions; denies-reports:120008} recent illness. He {Actions; denies-reports:120008} taking anticoagulants (***Aspirin  81 mg ***Coumadin  ***Xarelto ***Eliquis ***Plavix ).  Medications: Current Outpatient Medications  Medication Sig Dispense Refill   acetaminophen  (TYLENOL ) 500 MG tablet Take 1,000 mg by mouth 2 (two) times daily as needed for moderate pain (pain score 4-6), headache or fever.     aspirin  EC 81 MG tablet Take 1 tablet (81 mg total) by mouth daily with breakfast. Swallow whole. 30 tablet 2   atorvastatin  (LIPITOR) 40 MG tablet Take 1 tablet (40 mg total) by mouth daily. 30 tablet 5   cinacalcet  (SENSIPAR ) 60 MG tablet Take 30 mg by mouth daily.     insulin  isophane & regular human KwikPen (NOVOLIN  70/30 KWIKPEN) (70-30) 100 UNIT/ML KwikPen Inject 30U in the morning and 25U in the evening (Patient taking differently: Inject 25-30 Units into the skin See admin instructions. Inject 30 Units in the morning and 25 Units in the evening) 15 mL 3   Insulin  Pen Needle 32G X 4 MM MISC Use with insulin  pen. 100 each 0   midodrine  (PROAMATINE ) 5 MG tablet Take 3 tablets (15 mg total) by mouth 3 (three) times daily with meals. (Patient taking differently: Take 10 mg by mouth 3 (three) times daily with meals.) 270 tablet 5   pantoprazole  (PROTONIX ) 40 MG tablet Take 1 tablet (40 mg total) by mouth 2 (two) times daily. (Patient not taking: Reported on 03/24/2024) 60 tablet 2    sevelamer  carbonate (RENVELA ) 800 MG tablet Take  4,800 mg by mouth 3 (three) times daily with meals.     No current facility-administered medications for this visit.    Allergies: Allergies  Allergen Reactions   Sulfa Antibiotics Diarrhea and Nausea And Vomiting    Past Medical History:  Diagnosis Date   ESRD on hemodialysis (HCC)    a. HD MWF > 10 years.   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 01/2022 Echo: EF 45-50%, glob HK, mod LVH, mildly reduced RV fxn, mild MR, mod-sev MV Ca2+. Mild AS; b. 10/2023 Echo: EF 45-50%, glob HK, GrII DD, mildly reduced RV fxn, RVSP 52.4mmHg. Mild MR/MS, mod TR.   Hypertension    Nonischemic cardiomyopathy (HCC)    a. 2019 Cath [facility]): Nonobstructive disease; b. 12/2019 Echo: EF 50%; c. 12/2019 MV (Tx w/u [facility]): Ant infarct w/ peri-infarct ischemia. EF 35%; c. 05/2021 MV (Tx w/u [facility]): Cor Ca2+, EF 53%, motion artifact, equiv study, likely low risk.   Nonobstructive coronary artery disease    a.  2019 Cath ([place]): Left main normal, LAD 30 proximal, RCA 30 distal, EF 25%.   Obesity    Type 2 diabetes mellitus (HCC)    a. 10/2023 - admission w/ HHS.   Past Surgical History:  Procedure Laterality Date   A/V FISTULAGRAM N/A 08/15/2019   Procedure: A/V FISTULAGRAM;  Surgeon: Adine Hoof, MD;  Location: [facility] INVASIVE CV LAB;  Service: Cardiovascular;  Laterality: N/A;   A/V FISTULAGRAM N/A 01/21/2024   Procedure: A/V Fistulagram;  Surgeon: Patrick Boor, MD;  Location: [facility] INVASIVE CV LAB;  Service: Cardiovascular;  Laterality: N/A;   AMPUTATION Right 02/02/2022   Procedure: AMPUTATION 5th RAY;  Surgeon: Jennefer Moats, MD;  Location: [facility] OR;  Service: Podiatry;  Laterality: Right;   AMPUTATION Right 05/23/2022   Procedure: RIGHT BELOW KNEE AMPUTATION;  Surgeon: [name] Ford, MD;  Location: [facility] OR;  Service: Orthopedics;  Laterality: Right;   AMPUTATION Right 07/04/2022   Procedure: REVISION AMPUTATION  BELOW KNEE;  Surgeon: [name] Ford, MD;  Location: [facility] OR;  Service: Orthopedics;  Laterality: Right;   APPLICATION OF WOUND VAC  05/23/2022   Procedure: APPLICATION OF WOUND VAC;  Surgeon: [name] Ford, MD;  Location: [facility] OR;  Service: Orthopedics;;   AV FISTULA PLACEMENT     CARDIAC CATHETERIZATION  02/17/2018   LHC 02/17/18 ([name]): 30% pLAD, 30% dRCA, LVEDP 25-30, LVEF 25%.     CATARACT EXTRACTION W/PHACO Right 03/29/2020   Procedure: CATARACT EXTRACTION PHACO AND INTRAOCULAR LENS PLACEMENT (IOC)  RIGHT DIABETIC;  Surgeon: Annell Kidney, MD;  Location: [facility] ORS;  Service: Ophthalmology;  Laterality: Right;  Lot # [id number] H US : 02:30.9 AP% 9.5% CDE: 14.38   EYE SURGERY Right    cataract removed   [name] N/A 12/02/2023   Procedure: [name];  Surgeon: Daina Drum, MD;  Location: [facility] ENDOSCOPY;  Service: Gastroenterology;  Laterality: N/A;   I & D EXTREMITY Right 02/02/2022   Procedure: IRRIGATION AND DEBRIDEMENT RIGHT FOOT;  Surgeon: Jennefer Moats, MD;  Location: [facility] OR;  Service: Podiatry;  Laterality: Right;   INCISION AND DRAINAGE OF WOUND Right 02/08/2022   Procedure: IRRIGATION AND DEBRIDEMENT WOUND;  Surgeon: Lizzie Riis, DPM;  Location: [facility] OR;  Service: Podiatry;  Laterality: Right;  with removal of infected bone    INSERTION OF DIALYSIS CATHETER  08/13/2019   Procedure: Insertion Of Dialysis Catheter;  Surgeon: Mayo Speck, MD;  Location: [facility] OR;  Service: Vascular;;   INSERTION OF DIALYSIS CATHETER Left 07/19/2020   Procedure: INSERTION OF DIALYSIS CATHETER;  Surgeon: Adine Hoof, MD;  Location: [facility] OR;  Service: Vascular;  Laterality: Left;   IR FLUORO GUIDE CV LINE LEFT  02/16/2024   IR LUMBAR PUNCTURE  11/20/2023   IR US  GUIDE BX ASP/DRAIN  11/07/2022   IR US  GUIDE VASC ACCESS LEFT  02/16/2024   IRRIGATION AND DEBRIDEMENT ABSCESS     PERIPHERAL VASCULAR BALLOON ANGIOPLASTY  08/15/2019   Procedure: PERIPHERAL VASCULAR BALLOON  ANGIOPLASTY;  Surgeon: Adine Hoof, MD;  Location: [facility] INVASIVE CV LAB;  Service: Cardiovascular;;   PERIPHERAL VASCULAR BALLOON ANGIOPLASTY Left 02/06/2022   Procedure: PERIPHERAL VASCULAR BALLOON ANGIOPLASTY;  Surgeon: Young Hensen, MD;  Location: [facility] INVASIVE CV LAB;  Service: Cardiovascular;  Laterality: Left;   PERIPHERAL VASCULAR BALLOON ANGIOPLASTY  01/21/2024   Procedure: PERIPHERAL VASCULAR BALLOON ANGIOPLASTY;  Surgeon: Patrick Boor, MD;  Location: [facility] INVASIVE CV LAB;  Service: Cardiovascular;;  Cephalic Arch   RADIOLOGY WITH ANESTHESIA N/A 11/20/2023   Procedure: MRI WITH ANESTHESIA;  Surgeon: Radiologist, Medication, MD;  Location: [facility] OR;  Service: Radiology;  Laterality: N/A;   RADIOLOGY WITH ANESTHESIA N/A 11/20/2023   Procedure: IR WITH ANESTHESIA;  Surgeon: Myrlene Asper, DO;  Location: [facility] OR;  Service: Anesthesiology;  Laterality: N/A;  PLEASE OBTAIN OPENING PRESSURE AND DRAIN AT LEAST 30CC'S OF CSF.   REVISON OF ARTERIOVENOUS FISTULA Left 07/19/2020   Procedure: REVISON/PLICATION OF ARTERIOVENOUS FISTULA LEFT;  Surgeon: Adine Hoof, MD;  Location: [facility] OR;  Service: Vascular;  Laterality: Left;   THROMBECTOMY AND REVISION OF ARTERIOVENTOUS (AV) GORETEX  GRAFT Left 08/13/2019   Procedure: THROMBECTOMY OF LEFT ARM ARTERIOVENTOUS (AV) FISTULA;  Surgeon: Mayo Speck, MD;  Location: [facility] OR;  Service: Vascular;  Laterality: Left;   TOE AMPUTATION     UPPER EXTREMITY VENOGRAPHY Bilateral 03/30/2024   Procedure: UPPER EXTREMITY VENOGRAPHY;  Surgeon: Kayla Part, MD;  Location: [facility] INVASIVE CV LAB;  Service: Cardiovascular;  Laterality: Bilateral;   Family History  Problem Relation Age of Onset   Hypertension Mother    Social History   Socioeconomic History   Marital status: Divorced    Spouse name: Not on file   Number of children: Not on file   Years of education: Not on file   Highest education level: Not on file  Occupational History   Not on file   Tobacco Use   Smoking status: Never   Smokeless tobacco: Never  Vaping Use   Vaping status: Never Used  Substance and Sexual Activity   Alcohol use: Yes   Drug use: Never   Sexual activity: Not on file  Other Topics Concern   Not on file  Social History Narrative   Lives locally.  Does not routinely exercise.   Social Drivers of Corporate investment banker Strain: Not on file  Food Insecurity: No Food Insecurity (02/11/2024)   Hunger Vital Sign    Worried About Running Out of Food in the Last Year: Never true    Ran Out of Food in the Last Year: Never true  Transportation Needs: No Transportation Needs (02/11/2024)   PRAPARE - Administrator, Civil Service (Medical): No    Lack of Transportation (Non-Medical): No  Physical Activity: Not on file  Stress: Not on file  Social Connections: Not on file  Intimate Partner Violence: Not At Risk (02/11/2024)   Humiliation, Afraid, Rape, and Kick questionnaire  Fear of Current or Ex-Partner: No    Emotionally Abused: No    Physically Abused: No    Sexually Abused: No    SUBJECTIVE  Review of Systems Constitutional: Patient denies any unintentional weight loss or change in strength lntegumentary: Patient denies any rashes or pruritus Cardiovascular: Patient denies chest pain or syncope Respiratory: Patient denies shortness of breath Gastrointestinal: ***Patient denies nausea, vomiting, constipation, or diarrhea ***As per HPI Musculoskeletal: Patient denies muscle cramps or weakness Neurologic: Patient denies convulsions or seizures Allergic/Immunologic: Patient denies recent allergic reaction(s) Hematologic/Lymphatic: Patient denies bleeding tendencies Endocrine: Patient denies heat/cold intolerance  GU: As per HPI.  OBJECTIVE There were no vitals filed for this visit. There is no height or weight on file to calculate BMI.  Physical Examination Constitutional: No obvious distress; patient is non-toxic  appearing  Cardiovascular: No visible lower extremity edema.  Respiratory: The patient does not have audible wheezing/stridor; respirations do not appear labored  Gastrointestinal: Abdomen non-distended Musculoskeletal: Normal ROM of UEs  Skin: No obvious rashes/open sores  Neurologic: CN 2-12 grossly intact Psychiatric: Answered questions appropriately with normal affect  Hematologic/Lymphatic/Immunologic: No obvious bruises or sites of spontaneous bleeding  UA: ***negative ***positive for *** leukocytes, *** blood, ***nitrites Urine microscopy: *** WBC/hpf, *** RBC/hpf, *** bacteria ***glucosuria (secondary to ***Jardiance ***Farxiga use) ***otherwise unremarkable  PVR: *** ml  ASSESSMENT No diagnosis found.  For management of gross hematuria, we discussed possible etiologies including but not limited to: vigorous exercise, sexual activity, stone, trauma, blood thinner use, urinary tract infection, kidney function, ***BPH, ***radiation cystitis, malignancy. ***We discussed pt's nicotine use as a risk factor for GU cancer and encouraged ***continued cessation.***  We discussed the importance of work-up including assessing the upper and lower GU tract with CT urogram and cystoscopy. We will also check ***CMP, ***CBC, and ***voided cytology.  We discussed the risk for clot retention and pt was advised to increase fluid intake to thin out clots. Pt was advised to go to the ER if they become unable to urinate due to clot retention, start having symptoms of anemia (which were discussed), or any other significant concerning acute symptoms.  Pt verbalized understanding and decided to pursue this work-up. Patient agreed to follow-up afterward to discuss the results and formulate a treatment plan based on the findings. All questions were answered.   PLAN Advised the following: CT ***ordered. Voided cytology ***ordered. Labs (CBC, CMP, ***PSA) ordered. ***No follow-ups on file.  No orders  of the defined types were placed in this encounter.   It has been explained that the patient is to follow regularly with their PCP in addition to all other providers involved in their care and to follow instructions provided by these respective offices. Patient advised to contact urology clinic if any urologic-pertaining questions, concerns, new symptoms or problems arise in the interim period.  There are no Patient Instructions on file for this visit.  Electronically signed by:  Lauretta Ponto, MSN, FNP-C, CUNP 04/22/2024 10:26 AM

## 2024-04-25 ENCOUNTER — Other Ambulatory Visit: Payer: Self-pay

## 2024-04-25 DIAGNOSIS — I739 Peripheral vascular disease, unspecified: Secondary | ICD-10-CM

## 2024-05-03 ENCOUNTER — Ambulatory Visit: Admitting: Urology

## 2024-05-03 DIAGNOSIS — N186 End stage renal disease: Secondary | ICD-10-CM

## 2024-05-03 DIAGNOSIS — R310 Gross hematuria: Secondary | ICD-10-CM

## 2024-05-10 NOTE — Progress Notes (Signed)
 Office Note     CC:  ESRD Requesting Provider:  No ref. provider found  HPI: [name] GERRARD Sr. is a Right handed 63 y.o. (03-31-1961) male with kidney disease who presents in follow-up after bilateral extremity venogram. The patient has had multiple prior access procedures. Per pt, previous tunneled lines have been placed in bilateral internal jugular veins. Current access is left-sided IJ. Dialysis days are Monday Wednesday Friday.   A native of [place] , he has lived in [place] his entire life.  He currently lives on [address] acres of family land.  He is wheelchair-bound having previously undergone right-sided BKA.  Access surgeries include right sided radiocephalic, brachiocephalic, left-sided brachiocephalic.  Recent venogram demonstrated occlusion of bilateral subclavian veins, with patent internal jugular veins, brachiocephalic veins.  We discussed prior to his discharge, that an option would be hero graft.    Past Medical History:  Diagnosis Date   ESRD on hemodialysis (HCC)    a. HD MWF > 10 years.   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 01/2022 Echo: EF 45-50%, glob HK, mod LVH, mildly reduced RV fxn, mild MR, mod-sev MV Ca2+. Mild AS; b. 10/2023 Echo: EF 45-50%, glob HK, GrII DD, mildly reduced RV fxn, RVSP 52.35mmHg. Mild MR/MS, mod TR.   Hypertension    Nonischemic cardiomyopathy (HCC)    a. 2019 Cath [facility]): Nonobstructive disease; b. 12/2019 Echo: EF 50%; c. 12/2019 MV (Tx w/u [facility]): Ant infarct w/ peri-infarct ischemia. EF 35%; c. 05/2021 MV (Tx w/u [facility]): Cor Ca2+, EF 53%, motion artifact, equiv study, likely low risk.   Nonobstructive coronary artery disease    a.  2019 Cath ([place]): Left main normal, LAD 30 proximal, RCA 30 distal, EF 25%.   Obesity    Type 2 diabetes mellitus (HCC)    a. 10/2023 - admission w/ HHS.    Past Surgical History:  Procedure Laterality Date   A/V FISTULAGRAM N/A 08/15/2019   Procedure: A/V  FISTULAGRAM;  Surgeon: Sheree Penne Bruckner, MD;  Location: [facility] INVASIVE CV LAB;  Service: Cardiovascular;  Laterality: N/A;   A/V FISTULAGRAM N/A 01/21/2024   Procedure: A/V Fistulagram;  Surgeon: Melia Lynwood ORN, MD;  Location: [facility] INVASIVE CV LAB;  Service: Cardiovascular;  Laterality: N/A;   AMPUTATION Right 02/02/2022   Procedure: AMPUTATION 5th RAY;  Surgeon: Joya Stabs, MD;  Location: [facility] OR;  Service: Podiatry;  Laterality: Right;   AMPUTATION Right 05/23/2022   Procedure: RIGHT BELOW KNEE AMPUTATION;  Surgeon: Harden Jerona GAILS, MD;  Location: [facility] OR;  Service: Orthopedics;  Laterality: Right;   AMPUTATION Right 07/04/2022   Procedure: REVISION AMPUTATION BELOW KNEE;  Surgeon: Harden Jerona GAILS, MD;  Location: [facility] OR;  Service: Orthopedics;  Laterality: Right;   APPLICATION OF WOUND VAC  05/23/2022   Procedure: APPLICATION OF WOUND VAC;  Surgeon: Harden Jerona GAILS, MD;  Location: [facility] OR;  Service: Orthopedics;;   AV FISTULA PLACEMENT     CARDIAC CATHETERIZATION  02/17/2018   LHC 02/17/18 ([name]): 30% pLAD, 30% dRCA, LVEDP 25-30, LVEF 25%.     CATARACT EXTRACTION W/PHACO Right 03/29/2020   Procedure: CATARACT EXTRACTION PHACO AND INTRAOCULAR LENS PLACEMENT (IOC)  RIGHT DIABETIC;  Surgeon: Mittie Gaskin, MD;  Location: [facility] ORS;  Service: Ophthalmology;  Laterality: Right;  Lot # [id number] H US : 02:30.9 AP% 9.5% CDE: 14.38   EYE SURGERY Right    cataract removed   [name] N/A 12/02/2023   Procedure: [name];  Surgeon: Federico Rosario BROCKS, MD;  Location: [facility] ENDOSCOPY;  Service: Gastroenterology;  Laterality: N/A;   I & D EXTREMITY Right 02/02/2022   Procedure: IRRIGATION AND DEBRIDEMENT RIGHT FOOT;  Surgeon: Joya Stabs, MD;  Location: [facility] OR;  Service: Podiatry;  Laterality: Right;   INCISION AND DRAINAGE OF WOUND Right 02/08/2022   Procedure: IRRIGATION AND DEBRIDEMENT WOUND;  Surgeon: Burt Fus, DPM;  Location: [facility] OR;  Service: Podiatry;  Laterality: Right;   with removal of infected bone    INSERTION OF DIALYSIS CATHETER  08/13/2019   Procedure: Insertion Of Dialysis Catheter;  Surgeon: Oris Krystal FALCON, MD;  Location: [facility] OR;  Service: Vascular;;   INSERTION OF DIALYSIS CATHETER Left 07/19/2020   Procedure: INSERTION OF DIALYSIS CATHETER;  Surgeon: Sheree Penne Bruckner, MD;  Location: [facility] OR;  Service: Vascular;  Laterality: Left;   IR FLUORO GUIDE CV LINE LEFT  02/16/2024   IR LUMBAR PUNCTURE  11/20/2023   IR US  GUIDE BX ASP/DRAIN  11/07/2022   IR US  GUIDE VASC ACCESS LEFT  02/16/2024   IRRIGATION AND DEBRIDEMENT ABSCESS     PERIPHERAL VASCULAR BALLOON ANGIOPLASTY  08/15/2019   Procedure: PERIPHERAL VASCULAR BALLOON ANGIOPLASTY;  Surgeon: Sheree Penne Bruckner, MD;  Location: [facility] INVASIVE CV LAB;  Service: Cardiovascular;;   PERIPHERAL VASCULAR BALLOON ANGIOPLASTY Left 02/06/2022   Procedure: PERIPHERAL VASCULAR BALLOON ANGIOPLASTY;  Surgeon: Gretta Bruckner PARAS, MD;  Location: [facility] INVASIVE CV LAB;  Service: Cardiovascular;  Laterality: Left;   PERIPHERAL VASCULAR BALLOON ANGIOPLASTY  01/21/2024   Procedure: PERIPHERAL VASCULAR BALLOON ANGIOPLASTY;  Surgeon: Melia Lynwood ORN, MD;  Location: [facility] INVASIVE CV LAB;  Service: Cardiovascular;;  Cephalic Arch   RADIOLOGY WITH ANESTHESIA N/A 11/20/2023   Procedure: MRI WITH ANESTHESIA;  Surgeon: Radiologist, Medication, MD;  Location: [facility] OR;  Service: Radiology;  Laterality: N/A;   RADIOLOGY WITH ANESTHESIA N/A 11/20/2023   Procedure: IR WITH ANESTHESIA;  Surgeon: Alona Corners, DO;  Location: [facility] OR;  Service: Anesthesiology;  Laterality: N/A;  PLEASE OBTAIN OPENING PRESSURE AND DRAIN AT LEAST 30CC'S OF CSF.   REVISON OF ARTERIOVENOUS FISTULA Left 07/19/2020   Procedure: REVISON/PLICATION OF ARTERIOVENOUS FISTULA LEFT;  Surgeon: Sheree Penne Bruckner, MD;  Location: [facility] OR;  Service: Vascular;  Laterality: Left;   THROMBECTOMY AND REVISION OF ARTERIOVENTOUS (AV) GORETEX  GRAFT Left 08/13/2019   Procedure: THROMBECTOMY OF  LEFT ARM ARTERIOVENTOUS (AV) FISTULA;  Surgeon: Oris Krystal FALCON, MD;  Location: [facility] OR;  Service: Vascular;  Laterality: Left;   TOE AMPUTATION     UPPER EXTREMITY VENOGRAPHY Bilateral 03/30/2024   Procedure: UPPER EXTREMITY VENOGRAPHY;  Surgeon: Lanis Fonda BRAVO, MD;  Location: [facility] INVASIVE CV LAB;  Service: Cardiovascular;  Laterality: Bilateral;    Social History   Socioeconomic History   Marital status: Divorced    Spouse name: Not on file   Number of children: Not on file   Years of education: Not on file   Highest education level: Not on file  Occupational History   Not on file  Tobacco Use   Smoking status: Never   Smokeless tobacco: Never  Vaping Use   Vaping status: Never Used  Substance and Sexual Activity   Alcohol use: Yes   Drug use: Never   Sexual activity: Not on file  Other Topics Concern   Not on file  Social History Narrative   Lives locally.  Does not routinely exercise.   Social Drivers of Corporate investment banker Strain: Not on file  Food Insecurity: No Food  Insecurity (02/11/2024)   Hunger Vital Sign    Worried About Running Out of Food in the Last Year: Never true    Ran Out of Food in the Last Year: Never true  Transportation Needs: No Transportation Needs (02/11/2024)   PRAPARE - Administrator, Civil Service (Medical): No    Lack of Transportation (Non-Medical): No  Physical Activity: Not on file  Stress: Not on file  Social Connections: Not on file  Intimate Partner Violence: Not At Risk (02/11/2024)   Humiliation, Afraid, Rape, and Kick questionnaire    Fear of Current or Ex-Partner: No    Emotionally Abused: No    Physically Abused: No    Sexually Abused: No   Family History  Problem Relation Age of Onset   Hypertension Mother     Current Outpatient Medications  Medication Sig Dispense Refill   acetaminophen  (TYLENOL ) 500 MG tablet Take 1,000 mg by mouth 2 (two) times daily as needed for moderate pain (pain score 4-6),  headache or fever.     aspirin  EC 81 MG tablet Take 1 tablet (81 mg total) by mouth daily with breakfast. Swallow whole. 30 tablet 2   atorvastatin  (LIPITOR) 40 MG tablet Take 1 tablet (40 mg total) by mouth daily. 30 tablet 5   cinacalcet  (SENSIPAR ) 60 MG tablet Take 30 mg by mouth daily.     insulin  isophane & regular human KwikPen (NOVOLIN  70/30 KWIKPEN) (70-30) 100 UNIT/ML KwikPen Inject 30U in the morning and 25U in the evening (Patient taking differently: Inject 25-30 Units into the skin See admin instructions. Inject 30 Units in the morning and 25 Units in the evening) 15 mL 3   Insulin  Pen Needle 32G X 4 MM MISC Use with insulin  pen. 100 each 0   midodrine  (PROAMATINE ) 5 MG tablet Take 3 tablets (15 mg total) by mouth 3 (three) times daily with meals. (Patient taking differently: Take 10 mg by mouth 3 (three) times daily with meals.) 270 tablet 5   pantoprazole  (PROTONIX ) 40 MG tablet Take 1 tablet (40 mg total) by mouth 2 (two) times daily. (Patient not taking: Reported on 03/24/2024) 60 tablet 2   sevelamer  carbonate (RENVELA ) 800 MG tablet Take 4,800 mg by mouth 3 (three) times daily with meals.     No current facility-administered medications for this visit.    Allergies  Allergen Reactions   Sulfa Antibiotics Diarrhea and Nausea And Vomiting     REVIEW OF SYSTEMS:  [X]  denotes positive finding, [ ]  denotes negative finding Cardiac  Comments:  Chest pain or chest pressure:    Shortness of breath upon exertion:    Short of breath when lying flat:    Irregular heart rhythm:        Vascular    Pain in calf, thigh, or hip brought on by ambulation:    Pain in feet at night that wakes you up from your sleep:     Blood clot in your veins:    Leg swelling:         Pulmonary    Oxygen at home:    Productive cough:     Wheezing:         Neurologic    Sudden weakness in arms or legs:     Sudden numbness in arms or legs:     Sudden onset of difficulty speaking or slurred  speech:    Temporary loss of vision in one eye:     Problems with dizziness:  Gastrointestinal    Blood in stool:     Vomited blood:         Genitourinary    Burning when urinating:     Blood in urine:        Psychiatric    Major depression:         Hematologic    Bleeding problems:    Problems with blood clotting too easily:        Skin    Rashes or ulcers:        Constitutional    Fever or chills:      PHYSICAL EXAMINATION:  There were no vitals filed for this visit.  General:  WDWN in NAD; vital signs documented above Gait: Not observed HENT: WNL, normocephalic Pulmonary: normal non-labored breathing , without Rales, rhonchi,  wheezing Cardiac: regular HR,  Abdomen: soft, NT, no masses Skin: without rashes Vascular Exam/Pulses:  Right Left  Radial 2+ (normal) 2+ (normal)                       Extremities: without ischemic changes, without Gangrene , without cellulitis; without open wounds;  Musculoskeletal: no muscle wasting or atrophy  Neurologic: A&O X 3;  No focal weakness or paresthesias are detected Psychiatric:  The pt has Normal affect.   Non-Invasive Vascular Imaging:     +-----------------+-------------+----------+--------------+  Right Cephalic   Diameter (cm)Depth (cm)   Findings     +-----------------+-------------+----------+--------------+  Shoulder            0.34                               +-----------------+-------------+----------+--------------+  Prox upper arm       0.23                               +-----------------+-------------+----------+--------------+  Mid upper arm        0.35                               +-----------------+-------------+----------+--------------+  Dist upper arm                             thrombus     +-----------------+-------------+----------+--------------+  Antecubital fossa                          thrombus      +-----------------+-------------+----------+--------------+  Prox forearm                            not visualized  +-----------------+-------------+----------+--------------+  Mid forearm                             not visualized  +-----------------+-------------+----------+--------------+   +-----------------+-------------+----------+--------+  Right Basilic    Diameter (cm)Depth (cm)Findings  +-----------------+-------------+----------+--------+  Prox upper arm       0.44                         +-----------------+-------------+----------+--------+  Mid upper arm        0.37                         +-----------------+-------------+----------+--------+  Dist upper arm       0.42                         +-----------------+-------------+----------+--------+  Antecubital fossa    0.37                         +-----------------+-------------+----------+--------+   +-----------------+-------------+----------+--------+  Left Basilic     Diameter (cm)Depth (cm)Findings  +-----------------+-------------+----------+--------+  Mid upper arm        0.32                         +-----------------+-------------+----------+--------+  Dist upper arm       0.28                         +-----------------+-------------+----------+--------+  Antecubital fossa    0.19                         +-----------------+-------------+----------+--------+   Summary: Right: Patent basilic vein.         Thrombus observed in the cephalic vein.  Left: Patent basilic vein.   *See table(s) above for measurements and observations.   Right Doppler Findings:  +------------+----------+--------+--------+--------+  Site       PSV (cm/s)WaveformStenosisComments  +------------+----------+--------+--------+--------+  Brachial Mid71                                  +------------+----------+--------+--------+--------+  Radial Mid  28                                   +------------+----------+--------+--------+--------+  Ulnar Mid   28                                  +------------+----------+--------+--------+--------+        Right Pre-Dialysis Findings:  +-----------------------+----------+--------------------+---------+--------  +  Location              PSV (cm/s)Intralum. Diam. (cm)Waveform  Comments  +-----------------------+----------+--------------------+---------+--------  +  Brachial Antecub. [id number]        0.46                triphasic           +-----------------------+----------+--------------------+---------+--------  +  Radial Art at Wrist    28        0.12                biphasic            +-----------------------+----------+--------------------+---------+--------  +  Ulnar Art at Wrist     28        0.17                biphasic            +-----------------------+----------+--------------------+---------+--------  +   Left Doppler Findings:  +------------+----------+--------+--------+--------+  Site       PSV (cm/s)WaveformStenosisComments  +------------+----------+--------+--------+--------+  Brachial Mid58                                  +------------+----------+--------+--------+--------+        Left Pre-Dialysis Findings:  +-----------------------+----------+--------------------+---------+--------  +  Location              PSV (cm/s)Intralum. Diam. (cm)Waveform  Comments  +-----------------------+----------+--------------------+---------+--------  +  Brachial Antecub. [id number]        0.52                triphasic           +-----------------------+----------+--------------------+---------+--------  +     ASSESSMENT/PLAN:  [name] is a 63 y.o. male who presents with end stage renal disease  Based on vein mapping and examination, Judd to be best served with left sided hero graft.  He is aware that these have very poor  patency rates, but have lower infection profiles in thigh grafts.  With nonpalpable pulses in the feet, I do not think he is a candidate for thigh graft.  We discussed the other option is that he continues using the tunneled dialysis catheter.  At the age of 7, the likelihood that this gets infected is high.  Indio made a very clear he is not interested in thigh graft, nor thigh TDC.  He is aware that for a hero graft to be placed, it would likely require a tunneled dialysis catheter in the femoral vein while the hero catheter incisions healed.  He made it very clear that he does not want anything in the thighs, and would be happy staying with his current tunneled dialysis catheter.  He is aware that this comes with an increased risk of infection.  I told him I am happy to help should he change his mind.  At this time, we will continue with the tunneled dialysis catheter.   Fonda FORBES Rim, MD [facility] [phone]  Total time of patient care including pre-visit research, consultation, and documentation greater than 30 minutes

## 2024-05-12 ENCOUNTER — Encounter: Payer: Self-pay | Admitting: Vascular Surgery

## 2024-05-12 ENCOUNTER — Ambulatory Visit (HOSPITAL_COMMUNITY)
Admission: RE | Admit: 2024-05-12 | Discharge: 2024-05-12 | Disposition: A | Discharge status: Home / Self Care | Source: Ambulatory Visit | Attending: Vascular Surgery | Admitting: Vascular Surgery

## 2024-05-12 ENCOUNTER — Ambulatory Visit: Attending: Vascular Surgery | Admitting: Vascular Surgery

## 2024-05-12 VITALS — BP 127/79 | HR 73 | Temp 97.6°F | Resp 18 | Ht 73.0 in | Wt 223.0 lb

## 2024-05-12 DIAGNOSIS — Z992 Dependence on renal dialysis: Secondary | ICD-10-CM | POA: Diagnosis not present

## 2024-05-12 DIAGNOSIS — I739 Peripheral vascular disease, unspecified: Secondary | ICD-10-CM | POA: Diagnosis not present

## 2024-05-12 DIAGNOSIS — N186 End stage renal disease: Secondary | ICD-10-CM | POA: Diagnosis not present

## 2024-05-12 LAB — VAS US ABI WITH/WO TBI

## 2024-06-06 DIAGNOSIS — N529 Male erectile dysfunction, unspecified: Secondary | ICD-10-CM | POA: Insufficient documentation

## 2024-06-06 DIAGNOSIS — Z992 Dependence on renal dialysis: Secondary | ICD-10-CM | POA: Insufficient documentation

## 2024-06-06 NOTE — Progress Notes (Signed)
 Name: [name] WILLIARD Sr. DOB: Oct 26, 1961 MRN: [id number]  History of Present Illness: [name] [name] is a 63 y.o. male who presents today as a new patient at [facility] [place]. All available relevant medical records have been reviewed. His daughter [name] [name] accompanied him via video call.  Relevant History includes: 1. ESRD on hemodialysis (MWF).  Per chart review: > PSA has been normal (0.80 on 05/28/2021).  > 10/24/2023: CT stone study showed benign bilateral renal cysts. No GU stones, masses, or hydronephrosis; bladder and prostate unremarkable.  > 11/30/2023: CT angio showed: Bilateral renal atrophy. Multifocal small circumscribed water enhancing cysts consistent with simple cysts. No imaging follow-up is recommended. No hydronephrosis or enhancing renal mass. The ureters are unremarkable. The bladder is decompressed.   > 02/10/2024: Seen at urgent care for gross hematuria, dysuria, urinary frequency and urgency. Denies fever, chills, flank pain, nausea, vomiting, bowel changes... States a similar issue happened about a year ago and he had a urinary tract infection which resolved with antibiotics. He was unable to provide a urine sample; Cipro  prescribed.  > 02/12/2024: Negative urine culture (I&O cath specimen).  > 03/22/2024: Seen by PCP for gross hematuria. Bactrim prescribed. It appears that no urine testing was performed.  Today: Referred to Urology for gross hematuria. He reports that he makes very little urine; may only void a small amount of urine 1-2 times per month.  When he has had the gross hematuria he states there has been pain associated with that; no fevers.  He denies urinary urgency, straining to void, flank pain, or abdominal pain.  He denies history of kidney stones.  He denies history of pyelonephritis.  He denies history of recent or recurrent UTI. He denies history of GU malignancy or pelvic radiation.  He denies history of autoimmune disease. He  denies history of smoking. He reports taking anticoagulants (Aspirin  81 mg).  Also reports erectile dysfunction. States he has tried PDE-5 inhibitors in the past with no success. He states: I want to have one more little boy.   Medications: Current Outpatient Medications  Medication Sig Dispense Refill   acetaminophen  (TYLENOL ) 500 MG tablet Take 1,000 mg by mouth 2 (two) times daily as needed for moderate pain (pain score 4-6), headache or fever.     aspirin  EC 81 MG tablet Take 1 tablet (81 mg total) by mouth daily with breakfast. Swallow whole. 30 tablet 2   atorvastatin  (LIPITOR) 40 MG tablet Take 1 tablet (40 mg total) by mouth daily. 30 tablet 5   cinacalcet  (SENSIPAR ) 60 MG tablet Take 30 mg by mouth daily.     insulin  isophane & regular human KwikPen (NOVOLIN  70/30 KWIKPEN) (70-30) 100 UNIT/ML KwikPen Inject 30U in the morning and 25U in the evening (Patient taking differently: Inject 25-30 Units into the skin See admin instructions. Inject 30 Units in the morning and 25 Units in the evening) 15 mL 3   Insulin  Pen Needle 32G X 4 MM MISC Use with insulin  pen. 100 each 0   midodrine  (PROAMATINE ) 5 MG tablet Take 3 tablets (15 mg total) by mouth 3 (three) times daily with meals. (Patient taking differently: Take 10 mg by mouth 3 (three) times daily with meals.) 270 tablet 5   pantoprazole  (PROTONIX ) 40 MG tablet Take 1 tablet (40 mg total) by mouth 2 (two) times daily. 60 tablet 2   sevelamer  carbonate (RENVELA ) 800 MG tablet Take 4,800 mg by mouth 3 (three) times daily with meals.  No current facility-administered medications for this visit.    Allergies: Allergies  Allergen Reactions   Sulfa Antibiotics Diarrhea and Nausea And Vomiting    Past Medical History:  Diagnosis Date   ESRD on hemodialysis (HCC)    a. HD MWF > 10 years.   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 01/2022 Echo: EF 45-50%, glob HK, mod LVH, mildly reduced RV fxn, mild MR, mod-sev MV  Ca2+. Mild AS; b. 10/2023 Echo: EF 45-50%, glob HK, GrII DD, mildly reduced RV fxn, RVSP 52.15mmHg. Mild MR/MS, mod TR.   Hypertension    Nonischemic cardiomyopathy (HCC)    a. 2019 Cath [facility]): Nonobstructive disease; b. 12/2019 Echo: EF 50%; c. 12/2019 MV (Tx w/u [facility]): Ant infarct w/ peri-infarct ischemia. EF 35%; c. 05/2021 MV (Tx w/u [facility]): Cor Ca2+, EF 53%, motion artifact, equiv study, likely low risk.   Nonobstructive coronary artery disease    a.  2019 Cath ([place]): Left main normal, LAD 30 proximal, RCA 30 distal, EF 25%.   Obesity    Peripheral arterial disease (HCC)    Peripheral vascular disease (HCC)    Type 2 diabetes mellitus (HCC)    a. 10/2023 - admission w/ HHS.   Past Surgical History:  Procedure Laterality Date   A/V FISTULAGRAM N/A 08/15/2019   Procedure: A/V FISTULAGRAM;  Surgeon: Sheree Penne Bruckner, MD;  Location: [facility] INVASIVE CV LAB;  Service: Cardiovascular;  Laterality: N/A;   A/V FISTULAGRAM N/A 01/21/2024   Procedure: A/V Fistulagram;  Surgeon: Melia Lynwood ORN, MD;  Location: [facility] INVASIVE CV LAB;  Service: Cardiovascular;  Laterality: N/A;   AMPUTATION Right 02/02/2022   Procedure: AMPUTATION 5th RAY;  Surgeon: Joya Stabs, MD;  Location: [facility] OR;  Service: Podiatry;  Laterality: Right;   AMPUTATION Right 05/23/2022   Procedure: RIGHT BELOW KNEE AMPUTATION;  Surgeon: Harden Jerona GAILS, MD;  Location: [facility] OR;  Service: Orthopedics;  Laterality: Right;   AMPUTATION Right 07/04/2022   Procedure: REVISION AMPUTATION BELOW KNEE;  Surgeon: Harden Jerona GAILS, MD;  Location: [facility] OR;  Service: Orthopedics;  Laterality: Right;   APPLICATION OF WOUND VAC  05/23/2022   Procedure: APPLICATION OF WOUND VAC;  Surgeon: Harden Jerona GAILS, MD;  Location: [facility] OR;  Service: Orthopedics;;   AV FISTULA PLACEMENT     CARDIAC CATHETERIZATION  02/17/2018   LHC 02/17/18 ([name]): 30% pLAD, 30% dRCA, LVEDP 25-30, LVEF 25%.     CATARACT EXTRACTION W/PHACO Right 03/29/2020    Procedure: CATARACT EXTRACTION PHACO AND INTRAOCULAR LENS PLACEMENT (IOC)  RIGHT DIABETIC;  Surgeon: Mittie Gaskin, MD;  Location: [facility] ORS;  Service: Ophthalmology;  Laterality: Right;  Lot # [id number] H US : 02:30.9 AP% 9.5% CDE: 14.38   EYE SURGERY Right    cataract removed   [name] N/A 12/02/2023   Procedure: [name];  Surgeon: Federico Rosario BROCKS, MD;  Location: [facility] ENDOSCOPY;  Service: Gastroenterology;  Laterality: N/A;   I & D EXTREMITY Right 02/02/2022   Procedure: IRRIGATION AND DEBRIDEMENT RIGHT FOOT;  Surgeon: Joya Stabs, MD;  Location: [facility] OR;  Service: Podiatry;  Laterality: Right;   INCISION AND DRAINAGE OF WOUND Right 02/08/2022   Procedure: IRRIGATION AND DEBRIDEMENT WOUND;  Surgeon: Burt Fus, DPM;  Location: [facility] OR;  Service: Podiatry;  Laterality: Right;  with removal of infected bone    INSERTION OF DIALYSIS CATHETER  08/13/2019   Procedure: Insertion Of Dialysis Catheter;  Surgeon: Oris Krystal FALCON, MD;  Location: [facility] OR;  Service: Vascular;;   INSERTION OF DIALYSIS CATHETER Left 07/19/2020   Procedure: INSERTION OF DIALYSIS CATHETER;  Surgeon: Sheree Penne Bruckner, MD;  Location: [facility] OR;  Service: Vascular;  Laterality: Left;   IR FLUORO GUIDE CV LINE LEFT  02/16/2024   IR LUMBAR PUNCTURE  11/20/2023   IR US  GUIDE BX ASP/DRAIN  11/07/2022   IR US  GUIDE VASC ACCESS LEFT  02/16/2024   IRRIGATION AND DEBRIDEMENT ABSCESS     PERIPHERAL VASCULAR BALLOON ANGIOPLASTY  08/15/2019   Procedure: PERIPHERAL VASCULAR BALLOON ANGIOPLASTY;  Surgeon: Sheree Penne Bruckner, MD;  Location: [facility] INVASIVE CV LAB;  Service: Cardiovascular;;   PERIPHERAL VASCULAR BALLOON ANGIOPLASTY Left 02/06/2022   Procedure: PERIPHERAL VASCULAR BALLOON ANGIOPLASTY;  Surgeon: Gretta Bruckner PARAS, MD;  Location: [facility] INVASIVE CV LAB;  Service: Cardiovascular;  Laterality: Left;   PERIPHERAL VASCULAR BALLOON ANGIOPLASTY  01/21/2024   Procedure: PERIPHERAL VASCULAR BALLOON ANGIOPLASTY;   Surgeon: Melia Lynwood ORN, MD;  Location: [facility] INVASIVE CV LAB;  Service: Cardiovascular;;  Cephalic Arch   RADIOLOGY WITH ANESTHESIA N/A 11/20/2023   Procedure: MRI WITH ANESTHESIA;  Surgeon: Radiologist, Medication, MD;  Location: [facility] OR;  Service: Radiology;  Laterality: N/A;   RADIOLOGY WITH ANESTHESIA N/A 11/20/2023   Procedure: IR WITH ANESTHESIA;  Surgeon: Alona Corners, DO;  Location: [facility] OR;  Service: Anesthesiology;  Laterality: N/A;  PLEASE OBTAIN OPENING PRESSURE AND DRAIN AT LEAST 30CC'S OF CSF.   REVISON OF ARTERIOVENOUS FISTULA Left 07/19/2020   Procedure: REVISON/PLICATION OF ARTERIOVENOUS FISTULA LEFT;  Surgeon: Sheree Penne Bruckner, MD;  Location: [facility] OR;  Service: Vascular;  Laterality: Left;   THROMBECTOMY AND REVISION OF ARTERIOVENTOUS (AV) GORETEX  GRAFT Left 08/13/2019   Procedure: THROMBECTOMY OF LEFT ARM ARTERIOVENTOUS (AV) FISTULA;  Surgeon: Oris Krystal FALCON, MD;  Location: [facility] OR;  Service: Vascular;  Laterality: Left;   TOE AMPUTATION     UPPER EXTREMITY VENOGRAPHY Bilateral 03/30/2024   Procedure: UPPER EXTREMITY VENOGRAPHY;  Surgeon: Lanis Fonda BRAVO, MD;  Location: [facility] INVASIVE CV LAB;  Service: Cardiovascular;  Laterality: Bilateral;   Family History  Problem Relation Age of Onset   Hypertension Mother    Social History   Socioeconomic History   Marital status: Divorced    Spouse name: Not on file   Number of children: Not on file   Years of education: Not on file   Highest education level: Not on file  Occupational History   Not on file  Tobacco Use   Smoking status: Never   Smokeless tobacco: Never  Vaping Use   Vaping status: Never Used  Substance and Sexual Activity   Alcohol use: Yes   Drug use: Never   Sexual activity: Not on file  Other Topics Concern   Not on file  Social History Narrative   Lives locally.  Does not routinely exercise.   Social Drivers of Corporate investment banker Strain: Not on file  Food Insecurity: No Food Insecurity (02/11/2024)    Hunger Vital Sign    Worried About Running Out of Food in the Last Year: Never true    Ran Out of Food in the Last Year: Never true  Transportation Needs: No Transportation Needs (02/11/2024)   PRAPARE - Administrator, Civil Service (Medical): No    Lack of Transportation (Non-Medical): No  Physical Activity: Not on file  Stress: Not on file  Social Connections: Not on file  Intimate Partner Violence: Not At Risk (02/11/2024)   Humiliation, Afraid, Rape, and Kick questionnaire  Fear of Current or Ex-Partner: No    Emotionally Abused: No    Physically Abused: No    Sexually Abused: No    SUBJECTIVE  Review of Systems Constitutional: Patient denies any unintentional weight loss or change in strength lntegumentary: Patient denies any rashes or pruritus Cardiovascular: Patient denies chest pain or syncope Respiratory: Patient denies shortness of breath Gastrointestinal: Patient denies nausea, vomiting, constipation, or diarrhea  Musculoskeletal: Patient denies muscle cramps or weakness Neurologic: Patient denies convulsions or seizures Allergic/Immunologic: Patient denies recent allergic reaction(s) Hematologic/Lymphatic: Patient denies bleeding tendencies Endocrine: Patient denies heat/cold intolerance  GU: As per HPI.  OBJECTIVE Vitals:   06/07/24 0903  BP: (!) 95/58  Pulse: 74   There is no height or weight on file to calculate BMI.  Physical Examination Constitutional: No obvious distress; patient is non-toxic appearing  Cardiovascular: No visible lower extremity edema.  Respiratory: The patient does not have audible wheezing/stridor; respirations do not appear labored  Gastrointestinal: Abdomen non-distended Musculoskeletal: Normal ROM of UEs  Skin: No obvious rashes/open sores  Neurologic: CN 2-12 grossly intact Psychiatric: Answered questions appropriately with normal affect  Hematologic/Lymphatic/Immunologic: No obvious bruises or sites of  spontaneous bleeding  UA: Patient unable to void in office today PVR: 0 ml  ASSESSMENT Gross hematuria  ESRD on dialysis [facility])  Vasculogenic erectile dysfunction, unspecified vasculogenic erectile dysfunction type  For management of gross hematuria, we discussed possible etiologies including but not limited to: vigorous exercise, sexual activity, stone, trauma, blood thinner use, urinary tract infection, kidney function, BPH, prostatitis, malignancy. Advised cystoscopy for further evaluation.   For bothersome erectile dysfunction: Consult note sent to his cardiovascular providers (Cardiology: Dr. Debera / Vascular surgery: Dr. Lanis) for clearance to determine if he is healthy enough for sexual activity. Failed PDE-5 inhibitors in the past. Next step for ED treatment would be intracorporeal injections.   Patient verbalized understanding and agreement. Patient agreed to follow up afterward to discuss the results and formulate a treatment plan based on the findings. All questions were answered.   PLAN Advised the following: Return for 1st available cystoscopy with any urology MD (needs to be on a Tuesday or Thursday).  No orders of the defined types were placed in this encounter.   It has been explained that the patient is to follow regularly with their PCP in addition to all other providers involved in their care and to follow instructions provided by these respective offices. Patient advised to contact urology clinic if any urologic-pertaining questions, concerns, new symptoms or problems arise in the interim period.  There are no Patient Instructions on file for this visit.  Electronically signed by:  Lauraine KYM Oz, MSN, FNP-C, CUNP 06/07/2024 9:40 AM

## 2024-06-07 ENCOUNTER — Encounter: Payer: Self-pay | Admitting: Urology

## 2024-06-07 ENCOUNTER — Ambulatory Visit (INDEPENDENT_AMBULATORY_CARE_PROVIDER_SITE_OTHER): Admitting: Urology

## 2024-06-07 VITALS — BP 95/58 | HR 74

## 2024-06-07 DIAGNOSIS — R310 Gross hematuria: Secondary | ICD-10-CM | POA: Diagnosis not present

## 2024-06-07 DIAGNOSIS — N529 Male erectile dysfunction, unspecified: Secondary | ICD-10-CM | POA: Diagnosis not present

## 2024-06-07 DIAGNOSIS — Z992 Dependence on renal dialysis: Secondary | ICD-10-CM

## 2024-06-07 DIAGNOSIS — N186 End stage renal disease: Secondary | ICD-10-CM | POA: Diagnosis not present

## 2024-06-08 ENCOUNTER — Telehealth: Payer: Self-pay

## 2024-06-08 NOTE — Telephone Encounter (Signed)
Pt was made aware and voiced understanding

## 2024-06-08 NOTE — Telephone Encounter (Signed)
-----   Message from Lauraine JAYSON Oz sent at 06/07/2024 10:42 AM EDT ----- Please let patient know cardiology's response. Needs OV there prior to determination about cardiac clearance for future sexual activity / ED treatment. ----- Message ----- From: Debera Jayson MATSU, MD Sent: 06/07/2024  10:25 AM EDT To: Lauraine JAYSON Oz, FNP  I have not seen Mr. Sroka since meeting him as an inpatient consult in December 2024.  He was hospitalized at that time with NSTEMI.  I do not know how he is doing clinically from a cardiac perspective at this time and really cannot provide any guidance regarding his stability for sexual activity without more information.  I think it would just make sure that he has follow-up with our practice to address this. ----- Message ----- From: Oz Lauraine JAYSON, FNP Sent: 06/07/2024   9:37 AM EDT To: Jayson MATSU Debera, MD; Fonda FORBES Rim, MD  [name] [name] was seen in Urology clinic today and reported bothersome erectile dysfunction. States he has tried PDE-5 inhibitors in the past with no success. Next step for ED treatment would be intracorporeal injections. From a cardiovascular standpoint, is he healthy enough for sexual activity? Please advise. Thank you, Lauraine Oz, MSN, FNP-C, [facility] Nurse Practitioner [facility] [place]

## 2024-08-01 NOTE — Progress Notes (Incomplete)
 History of Present Illness: This 63 year old male, on chronic hemodialysis, presents at this time for cystoscopy for recurrent gross hematuria.  Past Medical History:  Diagnosis Date   ESRD on hemodialysis (HCC)    a. HD MWF > 10 years.   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 01/2022 Echo: EF 45-50%, glob HK, mod LVH, mildly reduced RV fxn, mild MR, mod-sev MV Ca2+. Mild AS; b. 10/2023 Echo: EF 45-50%, glob HK, GrII DD, mildly reduced RV fxn, RVSP 52.46mmHg. Mild MR/MS, mod TR.   Hypertension    Nonischemic cardiomyopathy (HCC)    a. 2019 Cath [facility]): Nonobstructive disease; b. 12/2019 Echo: EF 50%; c. 12/2019 MV (Tx w/u [facility]): Ant infarct w/ peri-infarct ischemia. EF 35%; c. 05/2021 MV (Tx w/u [facility]): Cor Ca2+, EF 53%, motion artifact, equiv study, likely low risk.   Nonobstructive coronary artery disease    a.  2019 Cath ([place]): Left main normal, LAD 30 proximal, RCA 30 distal, EF 25%.   Obesity    Peripheral arterial disease (HCC)    Peripheral vascular disease (HCC)    Type 2 diabetes mellitus (HCC)    a. 10/2023 - admission w/ HHS.    Past Surgical History:  Procedure Laterality Date   A/V FISTULAGRAM N/A 08/15/2019   Procedure: A/V FISTULAGRAM;  Surgeon: Sheree Penne Bruckner, MD;  Location: [facility] INVASIVE CV LAB;  Service: Cardiovascular;  Laterality: N/A;   A/V FISTULAGRAM N/A 01/21/2024   Procedure: A/V Fistulagram;  Surgeon: Melia Lynwood ORN, MD;  Location: [facility] INVASIVE CV LAB;  Service: Cardiovascular;  Laterality: N/A;   AMPUTATION Right 02/02/2022   Procedure: AMPUTATION 5th RAY;  Surgeon: Joya Stabs, MD;  Location: [facility] OR;  Service: Podiatry;  Laterality: Right;   AMPUTATION Right 05/23/2022   Procedure: RIGHT BELOW KNEE AMPUTATION;  Surgeon: Harden Jerona GAILS, MD;  Location: [facility] OR;  Service: Orthopedics;  Laterality: Right;   AMPUTATION Right 07/04/2022   Procedure: REVISION AMPUTATION BELOW KNEE;  Surgeon: Harden Jerona GAILS, MD;  Location:  [facility] OR;  Service: Orthopedics;  Laterality: Right;   APPLICATION OF WOUND VAC  05/23/2022   Procedure: APPLICATION OF WOUND VAC;  Surgeon: Harden Jerona GAILS, MD;  Location: [facility] OR;  Service: Orthopedics;;   AV FISTULA PLACEMENT     CARDIAC CATHETERIZATION  02/17/2018   LHC 02/17/18 ([name]): 30% pLAD, 30% dRCA, LVEDP 25-30, LVEF 25%.     CATARACT EXTRACTION W/PHACO Right 03/29/2020   Procedure: CATARACT EXTRACTION PHACO AND INTRAOCULAR LENS PLACEMENT (IOC)  RIGHT DIABETIC;  Surgeon: Mittie Gaskin, MD;  Location: [facility] ORS;  Service: Ophthalmology;  Laterality: Right;  Lot # [id number] H US : 02:30.9 AP% 9.5% CDE: 14.38   EYE SURGERY Right    cataract removed   [name] N/A 12/02/2023   Procedure: [name];  Surgeon: Federico Rosario BROCKS, MD;  Location: [facility] ENDOSCOPY;  Service: Gastroenterology;  Laterality: N/A;   I & D EXTREMITY Right 02/02/2022   Procedure: IRRIGATION AND DEBRIDEMENT RIGHT FOOT;  Surgeon: Joya Stabs, MD;  Location: [facility] OR;  Service: Podiatry;  Laterality: Right;   INCISION AND DRAINAGE OF WOUND Right 02/08/2022   Procedure: IRRIGATION AND DEBRIDEMENT WOUND;  Surgeon: Burt Fus, DPM;  Location: [facility] OR;  Service: Podiatry;  Laterality: Right;  with removal of infected bone    INSERTION OF DIALYSIS CATHETER  08/13/2019   Procedure: Insertion Of Dialysis Catheter;  Surgeon: Oris Krystal FALCON, MD;  Location: [facility] OR;  Service: Vascular;;  INSERTION OF DIALYSIS CATHETER Left 07/19/2020   Procedure: INSERTION OF DIALYSIS CATHETER;  Surgeon: Sheree Penne Bruckner, MD;  Location: [facility] OR;  Service: Vascular;  Laterality: Left;   IR FLUORO GUIDE CV LINE LEFT  02/16/2024   IR LUMBAR PUNCTURE  11/20/2023   IR US  GUIDE BX ASP/DRAIN  11/07/2022   IR US  GUIDE VASC ACCESS LEFT  02/16/2024   IRRIGATION AND DEBRIDEMENT ABSCESS     PERIPHERAL VASCULAR BALLOON ANGIOPLASTY  08/15/2019   Procedure: PERIPHERAL VASCULAR BALLOON ANGIOPLASTY;  Surgeon: Sheree Penne Bruckner,  MD;  Location: [facility] INVASIVE CV LAB;  Service: Cardiovascular;;   PERIPHERAL VASCULAR BALLOON ANGIOPLASTY Left 02/06/2022   Procedure: PERIPHERAL VASCULAR BALLOON ANGIOPLASTY;  Surgeon: Gretta Bruckner PARAS, MD;  Location: [facility] INVASIVE CV LAB;  Service: Cardiovascular;  Laterality: Left;   PERIPHERAL VASCULAR BALLOON ANGIOPLASTY  01/21/2024   Procedure: PERIPHERAL VASCULAR BALLOON ANGIOPLASTY;  Surgeon: Melia Lynwood ORN, MD;  Location: [facility] INVASIVE CV LAB;  Service: Cardiovascular;;  Cephalic Arch   RADIOLOGY WITH ANESTHESIA N/A 11/20/2023   Procedure: MRI WITH ANESTHESIA;  Surgeon: Radiologist, Medication, MD;  Location: [facility] OR;  Service: Radiology;  Laterality: N/A;   RADIOLOGY WITH ANESTHESIA N/A 11/20/2023   Procedure: IR WITH ANESTHESIA;  Surgeon: Alona Corners, DO;  Location: [facility] OR;  Service: Anesthesiology;  Laterality: N/A;  PLEASE OBTAIN OPENING PRESSURE AND DRAIN AT LEAST 30CC'S OF CSF.   REVISON OF ARTERIOVENOUS FISTULA Left 07/19/2020   Procedure: REVISON/PLICATION OF ARTERIOVENOUS FISTULA LEFT;  Surgeon: Sheree Penne Bruckner, MD;  Location: [facility] OR;  Service: Vascular;  Laterality: Left;   THROMBECTOMY AND REVISION OF ARTERIOVENTOUS (AV) GORETEX  GRAFT Left 08/13/2019   Procedure: THROMBECTOMY OF LEFT ARM ARTERIOVENTOUS (AV) FISTULA;  Surgeon: Oris Krystal FALCON, MD;  Location: [facility] OR;  Service: Vascular;  Laterality: Left;   TOE AMPUTATION     UPPER EXTREMITY VENOGRAPHY Bilateral 03/30/2024   Procedure: UPPER EXTREMITY VENOGRAPHY;  Surgeon: Lanis Fonda BRAVO, MD;  Location: [facility] INVASIVE CV LAB;  Service: Cardiovascular;  Laterality: Bilateral;    Home Medications:  Allergies as of 08/02/2024       Reactions   Sulfa Antibiotics Diarrhea, Nausea And Vomiting        Medication List        Accurate as of August 01, 2024 12:28 PM. If you have any questions, ask your nurse or doctor.          acetaminophen  500 MG tablet Commonly known as: TYLENOL  Take 1,000 mg by mouth 2 (two) times daily as  needed for moderate pain (pain score 4-6), headache or fever.   aspirin  EC 81 MG tablet Take 1 tablet (81 mg total) by mouth daily with breakfast. Swallow whole.   atorvastatin  40 MG tablet Commonly known as: LIPITOR Take 1 tablet (40 mg total) by mouth daily.   BD Pen Needle Nano U/F 32G X 4 MM Misc Generic drug: Insulin  Pen Needle Use with insulin  pen.   cinacalcet  60 MG tablet Commonly known as: SENSIPAR  Take 30 mg by mouth daily.   midodrine  5 MG tablet Commonly known as: PROAMATINE  Take 3 tablets (15 mg total) by mouth 3 (three) times daily with meals. What changed: how much to take   NovoLIN  70/30 Kwikpen (70-30) 100 UNIT/ML KwikPen Generic drug: insulin  isophane & regular human KwikPen Inject 30U in the morning and 25U in the evening What changed:  how much to take how to take this when to take this additional instructions   pantoprazole  40 MG tablet Commonly  known as: PROTONIX  Take 1 tablet (40 mg total) by mouth 2 (two) times daily.   sevelamer  carbonate 800 MG tablet Commonly known as: RENVELA  Take 4,800 mg by mouth 3 (three) times daily with meals.        Allergies:  Allergies  Allergen Reactions   Sulfa Antibiotics Diarrhea and Nausea And Vomiting    Family History  Problem Relation Age of Onset   Hypertension Mother     Social History:  reports that he has never smoked. He has never used smokeless tobacco. He reports current alcohol use. He reports that he does not use drugs.  ROS: A complete review of systems was performed.  All systems are negative except for pertinent findings as noted.  Physical Exam:  Vital signs in last 24 hours: There were no vitals taken for this visit. Constitutional:  Alert and oriented, No acute distress Cardiovascular: Regular rate  Respiratory: Normal respiratory effort GI: Abdomen is soft, nontender, nondistended, no abdominal masses. No CVAT.  Genitourinary: Normal male phallus, testes are descended  bilaterally and non-tender and without masses, scrotum is normal in appearance without lesions or masses, perineum is normal on inspection. Lymphatic: No lymphadenopathy Neurologic: Grossly intact, no focal deficits Psychiatric: Normal mood and affect  I have reviewed prior pt notes  I have reviewed notes from referring/previous physicians  I have reviewed urinalysis results  I have independently reviewed prior imaging  I have reviewed prior PSA results  I have reviewed prior urine culture  Cystoscopy Procedure Note:  Indication: ***  After informed consent and discussion of the procedure and its risks, BERLIN MOKRY Sr. was positioned and prepped in the standard fashion.  Cystoscopy was performed with a flexible cystoscope.   Findings: Urethra:*** Prostate:*** Bladder neck:*** Ureteral orifices:*** Bladder:***  The patient tolerated the procedure well.    Impression/Assessment:  ***  Plan:  ***   I had

## 2024-08-02 ENCOUNTER — Other Ambulatory Visit: Admitting: Urology

## 2024-09-19 ENCOUNTER — Encounter: Payer: Self-pay | Admitting: Radiology

## 2024-10-16 ENCOUNTER — Emergency Department (HOSPITAL_COMMUNITY)
Admission: EM | Admit: 2024-10-16 | Discharge: 2024-10-16 | Disposition: A | Discharge status: Home / Self Care | Attending: Emergency Medicine | Admitting: Emergency Medicine

## 2024-10-16 ENCOUNTER — Encounter (HOSPITAL_COMMUNITY): Payer: Self-pay

## 2024-10-16 ENCOUNTER — Emergency Department (HOSPITAL_COMMUNITY)

## 2024-10-16 ENCOUNTER — Other Ambulatory Visit: Payer: Self-pay

## 2024-10-16 DIAGNOSIS — Z7982 Long term (current) use of aspirin: Secondary | ICD-10-CM | POA: Insufficient documentation

## 2024-10-16 DIAGNOSIS — R059 Cough, unspecified: Secondary | ICD-10-CM | POA: Insufficient documentation

## 2024-10-16 DIAGNOSIS — Z992 Dependence on renal dialysis: Secondary | ICD-10-CM | POA: Diagnosis not present

## 2024-10-16 DIAGNOSIS — I509 Heart failure, unspecified: Secondary | ICD-10-CM | POA: Diagnosis not present

## 2024-10-16 DIAGNOSIS — R531 Weakness: Secondary | ICD-10-CM | POA: Diagnosis not present

## 2024-10-16 LAB — CBG MONITORING, ED: Glucose-Capillary: 83 mg/dL (ref 70–99)

## 2024-10-16 MED ORDER — DOXYCYCLINE HYCLATE 100 MG PO CAPS: 100.0000 mg | ORAL_CAPSULE | Freq: Two times a day (BID) | ORAL | 0 refills | Status: DC

## 2024-10-16 MED ORDER — MIDODRINE HCL 5 MG PO TABS
5.0000 mg | ORAL_TABLET | Freq: Once | ORAL | Status: AC
  Administered 2024-10-16: 5 mg via ORAL
  Filled 2024-10-16: qty 1

## 2024-10-16 MED ORDER — DOXYCYCLINE HYCLATE 100 MG PO TABS
100.0000 mg | ORAL_TABLET | Freq: Once | ORAL | Status: AC
  Administered 2024-10-16: 100 mg via ORAL
  Filled 2024-10-16: qty 1

## 2024-10-16 NOTE — ED Triage Notes (Signed)
 Lives at home with friend and brother who states that pt has become more lethargic over last couple days. Pt does dialysis M,W,F. Was able to go Friday but did not go last Wednesday bc holiday. CBG 136. Hx of CHF

## 2024-10-16 NOTE — Discharge Instructions (Signed)
 Get your dialysis tomorrow follow-up with your doctor later this week for recheck.  Make sure you take your midodrine 

## 2024-10-16 NOTE — ED Provider Notes (Signed)
 [place] EMERGENCY DEPARTMENT AT [facility] Provider Note   CSN: [id number] Arrival date & time: 10/16/24  1007     Patient presents with: No chief complaint on file.   [name] URTON Sr. is a 63 y.o. male.  {Add pertinent medical, surgical, social history, OB history to YEP:[id number]} Patient was sent to the emergency department for lethargy.  When patient arrived he was alert without any complaints.  He is a dialysis patient and gets dialysis tomorrow.   Weakness      Prior to Admission medications   Medication Sig Start Date End Date Taking? Authorizing Provider  doxycycline  (VIBRAMYCIN ) 100 MG capsule Take 1 capsule (100 mg total) by mouth 2 (two) times daily. One po bid x 7 days 10/16/24  Yes Tanya Crothers, MD  acetaminophen  (TYLENOL ) 500 MG tablet Take 1,000 mg by mouth 2 (two) times daily as needed for moderate pain (pain score 4-6), headache or fever.    [provider]  aspirin  EC 81 MG tablet Take 1 tablet (81 mg total) by mouth daily with breakfast. Swallow whole. 02/17/24   Pearlean Manus, MD  atorvastatin  (LIPITOR) 40 MG tablet Take 1 tablet (40 mg total) by mouth daily. 02/17/24 06/07/24  Pearlean Manus, MD  cinacalcet  (SENSIPAR ) 60 MG tablet Take 30 mg by mouth daily. 02/03/24   [provider]  insulin  isophane & regular human KwikPen (NOVOLIN  70/30 KWIKPEN) (70-30) 100 UNIT/ML KwikPen Inject 30U in the morning and 25U in the evening Patient taking differently: Inject 25-30 Units into the skin See admin instructions. Inject 30 Units in the morning and 25 Units in the evening 10/27/23   Ezenduka, Nkeiruka J, MD  Insulin  Pen Needle 32G X 4 MM MISC Use with insulin  pen. 10/28/23   Ezenduka, Nkeiruka J, MD  midodrine  (PROAMATINE ) 5 MG tablet Take 3 tablets (15 mg total) by mouth 3 (three) times daily with meals. Patient taking differently: Take 10 mg by mouth 3 (three) times daily with meals. 02/17/24   Pearlean Manus, MD  pantoprazole   (PROTONIX ) 40 MG tablet Take 1 tablet (40 mg total) by mouth 2 (two) times daily. 01/13/24 06/07/24  Krishnan, Gokul, MD  sevelamer  carbonate (RENVELA ) 800 MG tablet Take 4,800 mg by mouth 3 (three) times daily with meals.    [provider]    Allergies: Sulfa antibiotics    Review of Systems  Neurological:  Positive for weakness.    Updated Vital Signs BP (!) 106/55   Pulse 74   Temp 98.9 F (37.2 C) (Oral)   Resp (!) 30   Ht 6' 1 (1.854 m)   Wt 108.9 kg   SpO2 96%   BMI 31.66 kg/m   Physical Exam  (all labs ordered are listed, but only abnormal results are displayed) Labs Reviewed  CBG MONITORING, ED    EKG: None  Radiology: [facility] Chest Port 1 View Result Date: 10/16/2024 EXAM: 1 VIEW(S) XRAY OF THE CHEST 10/16/2024 12:11:00 PM COMPARISON: 01/13/2024 CLINICAL HISTORY: sob FINDINGS: LINES, TUBES AND DEVICES: Tunneled left IJ hemodialysis catheter with tip at superior cavoatrial junction. LUNGS AND PLEURA: Low lung volumes. New left retrocardiac airspace opacity. Possible small left pleural effusion. No pneumothorax. HEART AND MEDIASTINUM: The cardiac silhouette is unremarkable. Aortic atherosclerosis is present. BONES AND SOFT TISSUES: No acute osseous abnormality. IMPRESSION: 1. New left retrocardiac airspace opacity and possible small left pleural effusion. 2. Low lung volumes. Electronically signed by: Evalene Coho MD 10/16/2024 12:16 PM EST RP Workstation: [id number]    {  Document cardiac monitor, telemetry assessment procedure when appropriate:32947} Procedures   Medications Ordered in the ED  doxycycline  (VIBRA -TABS) tablet 100 mg (has no administration in time range)  midodrine  (PROAMATINE ) tablet 5 mg (5 mg Oral Given 10/16/24 1059)   Chest x-ray shows possible infiltrate and some fluid.  Patient has no fever minimal cough.  We will cover him with some doxycycline  but I suspect this is just fluid related   {Click here for ABCD2, HEART and other  calculators REFRESH Note before signing:1}                              Medical Decision Making Amount and/or Complexity of Data Reviewed Radiology: ordered.  Risk Prescription drug management.   Dialysis patient with mild cough and possible infiltrate on chest x-ray.  He is sent home on doxycycline  and will get dialysis tomorrow  {Document critical care time when appropriate  Document review of labs and clinical decision tools ie CHADS2VASC2, etc  Document your independent review of radiology images and any outside records  Document your discussion with family members, caretakers and with consultants  Document social determinants of health affecting pt's care  Document your decision making why or why not admission, treatments were needed:32947:::1}   Final diagnoses:  Weakness    ED Discharge Orders          Ordered    doxycycline  (VIBRAMYCIN ) 100 MG capsule  2 times daily        10/16/24 1317

## 2024-10-16 NOTE — ED Notes (Signed)
 Pt in room at this time with lunch.

## 2024-10-16 NOTE — ED Notes (Signed)
 Spoke with pt daughter Jeoffrey, and it is the consensus that Jimmy is coming to pick up patient.

## 2024-10-16 NOTE — ED Notes (Signed)
 Spoke with pt. Pt states brother is on his way to pick him up.

## 2024-10-16 NOTE — ED Notes (Signed)
 Pt verbalized he has not taken midodrine  in a few days and only got dialysis Sun and Fri d/t holidays, normally M,W,F.

## 2024-10-17 ENCOUNTER — Other Ambulatory Visit: Payer: Self-pay

## 2024-10-17 ENCOUNTER — Encounter (HOSPITAL_COMMUNITY): Payer: Self-pay | Admitting: Emergency Medicine

## 2024-10-17 ENCOUNTER — Emergency Department (HOSPITAL_COMMUNITY)
Admission: EM | Admit: 2024-10-17 | Discharge: 2024-10-18 | Disposition: A | Attending: Emergency Medicine | Admitting: Emergency Medicine

## 2024-10-17 DIAGNOSIS — N186 End stage renal disease: Secondary | ICD-10-CM | POA: Diagnosis not present

## 2024-10-17 DIAGNOSIS — Z992 Dependence on renal dialysis: Secondary | ICD-10-CM | POA: Diagnosis not present

## 2024-10-17 DIAGNOSIS — Z7982 Long term (current) use of aspirin: Secondary | ICD-10-CM | POA: Diagnosis not present

## 2024-10-17 DIAGNOSIS — Z794 Long term (current) use of insulin: Secondary | ICD-10-CM | POA: Diagnosis not present

## 2024-10-17 DIAGNOSIS — R300 Dysuria: Secondary | ICD-10-CM | POA: Diagnosis present

## 2024-10-17 NOTE — ED Triage Notes (Signed)
 Pt c/o painful urination. He states he has not urinated in 13 years until last week. Pt states he missed a session of dialysis last week but last treatment was Friday.

## 2024-10-18 DIAGNOSIS — R300 Dysuria: Secondary | ICD-10-CM | POA: Diagnosis not present

## 2024-10-18 MED ORDER — CIPROFLOXACIN HCL 250 MG PO TABS
500.0000 mg | ORAL_TABLET | Freq: Once | ORAL | Status: AC
  Administered 2024-10-18: 500 mg via ORAL
  Filled 2024-10-18: qty 2

## 2024-10-18 MED ORDER — CIPROFLOXACIN HCL 500 MG PO TABS: 500.0000 mg | ORAL_TABLET | Freq: Two times a day (BID) | ORAL | 0 refills | Status: DC

## 2024-10-18 NOTE — ED Provider Notes (Signed)
 [name] EMERGENCY DEPARTMENT AT [facility] Provider Note   CSN: [id number] Arrival date & time: 10/17/24  2249     Patient presents with: Dysuria   [name] [name] is a 63 y.o. male.   Patient is a 63 year old male with history of end-stage renal disease on hemodialysis for the past 15 years.  Patient presenting today with complaints of burning with an episode of urination.  He reports that he very infrequently makes urine.  He had a small amount of urine today that was associated with burning in his urethra.  He has felt fevered and is concerned he may have infection.  He denies other complaints.       Prior to Admission medications   Medication Sig Start Date End Date Taking? Authorizing Provider  acetaminophen  (TYLENOL ) 500 MG tablet Take 1,000 mg by mouth 2 (two) times daily as needed for moderate pain (pain score 4-6), headache or fever.    [provider]  aspirin  EC 81 MG tablet Take 1 tablet (81 mg total) by mouth daily with breakfast. Swallow whole. 02/17/24   Pearlean Manus, MD  atorvastatin  (LIPITOR) 40 MG tablet Take 1 tablet (40 mg total) by mouth daily. 02/17/24 06/07/24  Pearlean Manus, MD  cinacalcet  (SENSIPAR ) 60 MG tablet Take 30 mg by mouth daily. 02/03/24   [provider]  doxycycline  (VIBRAMYCIN ) 100 MG capsule Take 1 capsule (100 mg total) by mouth 2 (two) times daily. One po bid x 7 days 10/16/24   Zammit, Joseph, MD  insulin  isophane & regular human KwikPen (NOVOLIN  70/30 KWIKPEN) (70-30) 100 UNIT/ML KwikPen Inject 30U in the morning and 25U in the evening Patient taking differently: Inject 25-30 Units into the skin See admin instructions. Inject 30 Units in the morning and 25 Units in the evening 10/27/23   Ezenduka, Nkeiruka J, MD  Insulin  Pen Needle 32G X 4 MM MISC Use with insulin  pen. 10/28/23   Ezenduka, Nkeiruka J, MD  midodrine  (PROAMATINE ) 5 MG tablet Take 3 tablets (15 mg total) by mouth 3 (three) times daily with  meals. Patient taking differently: Take 10 mg by mouth 3 (three) times daily with meals. 02/17/24   Pearlean Manus, MD  pantoprazole  (PROTONIX ) 40 MG tablet Take 1 tablet (40 mg total) by mouth 2 (two) times daily. 01/13/24 06/07/24  Krishnan, Gokul, MD  sevelamer  carbonate (RENVELA ) 800 MG tablet Take 4,800 mg by mouth 3 (three) times daily with meals.    [provider]    Allergies: Sulfa antibiotics    Review of Systems  All other systems reviewed and are negative.   Updated Vital Signs BP 128/60   Pulse 81   Temp 99.5 F (37.5 C) (Oral)   Resp 18   Ht 6' 1 (1.854 m)   Wt 108.9 kg   SpO2 98%   BMI 31.67 kg/m   Physical Exam Vitals and nursing note reviewed.  Constitutional:      General: He is not in acute distress.    Appearance: He is well-developed. He is not diaphoretic.  HENT:     Head: Normocephalic and atraumatic.  Cardiovascular:     Rate and Rhythm: Normal rate and regular rhythm.     Heart sounds: No murmur heard.    No friction rub.  Pulmonary:     Effort: Pulmonary effort is normal. No respiratory distress.     Breath sounds: Normal breath sounds. No wheezing or rales.  Abdominal:     General: Bowel sounds  are normal. There is no distension.     Palpations: Abdomen is soft.     Tenderness: There is no abdominal tenderness.  Musculoskeletal:        General: Normal range of motion.     Cervical back: Normal range of motion and neck supple.  Skin:    General: Skin is warm and dry.  Neurological:     Mental Status: He is alert and oriented to person, place, and time.     Coordination: Coordination normal.     (all labs ordered are listed, but only abnormal results are displayed) Labs Reviewed  URINALYSIS, ROUTINE W REFLEX MICROSCOPIC    EKG: None  Radiology: [facility] Chest Port 1 View Result Date: 10/16/2024 EXAM: 1 VIEW(S) XRAY OF THE CHEST 10/16/2024 12:11:00 PM COMPARISON: 01/13/2024 CLINICAL HISTORY: sob FINDINGS: LINES, TUBES AND  DEVICES: Tunneled left IJ hemodialysis catheter with tip at superior cavoatrial junction. LUNGS AND PLEURA: Low lung volumes. New left retrocardiac airspace opacity. Possible small left pleural effusion. No pneumothorax. HEART AND MEDIASTINUM: The cardiac silhouette is unremarkable. Aortic atherosclerosis is present. BONES AND SOFT TISSUES: No acute osseous abnormality. IMPRESSION: 1. New left retrocardiac airspace opacity and possible small left pleural effusion. 2. Low lung volumes. Electronically signed by: Evalene Coho MD 10/16/2024 12:16 PM EST RP Workstation: [id number]     Procedures   Medications Ordered in the ED  ciprofloxacin  (CIPRO ) tablet 500 mg (has no administration in time range)                                    Medical Decision Making Amount and/or Complexity of Data Reviewed Labs: ordered.  Risk Prescription drug management.   Patient presenting with dysuria as described in the HPI.  He has a history of end-stage renal disease and makes no urine, but did have a small amount of urine today that burned.  As he does not make urine, no urinalysis is obtainable.  I will treat him presumptively with Cipro .  Patient is to follow-up as needed.     Final diagnoses:  None    ED Discharge Orders     None          [name], MD 10/18/24 0230

## 2024-10-18 NOTE — Discharge Instructions (Addendum)
 Begin taking Cipro  as prescribed.  Return to the ER if you develop any new and/or concerning issues.

## 2024-10-24 ENCOUNTER — Other Ambulatory Visit: Payer: Self-pay

## 2024-10-24 ENCOUNTER — Emergency Department (HOSPITAL_COMMUNITY)

## 2024-10-24 ENCOUNTER — Inpatient Hospital Stay (HOSPITAL_COMMUNITY)
Admission: EM | Admit: 2024-10-24 | Discharge: 2024-12-18 | DRG: 314 | Disposition: E | Attending: Internal Medicine | Admitting: Internal Medicine

## 2024-10-24 ENCOUNTER — Encounter (HOSPITAL_COMMUNITY): Payer: Self-pay

## 2024-10-24 DIAGNOSIS — M109 Gout, unspecified: Secondary | ICD-10-CM | POA: Diagnosis present

## 2024-10-24 DIAGNOSIS — Z66 Do not resuscitate: Secondary | ICD-10-CM | POA: Diagnosis present

## 2024-10-24 DIAGNOSIS — A4102 Sepsis due to Methicillin resistant Staphylococcus aureus: Secondary | ICD-10-CM | POA: Diagnosis present

## 2024-10-24 DIAGNOSIS — E1165 Type 2 diabetes mellitus with hyperglycemia: Secondary | ICD-10-CM | POA: Diagnosis present

## 2024-10-24 DIAGNOSIS — E1151 Type 2 diabetes mellitus with diabetic peripheral angiopathy without gangrene: Secondary | ICD-10-CM | POA: Diagnosis present

## 2024-10-24 DIAGNOSIS — E1122 Type 2 diabetes mellitus with diabetic chronic kidney disease: Secondary | ICD-10-CM | POA: Diagnosis present

## 2024-10-24 DIAGNOSIS — I82B21 Chronic embolism and thrombosis of right subclavian vein: Secondary | ICD-10-CM | POA: Diagnosis present

## 2024-10-24 DIAGNOSIS — R278 Other lack of coordination: Secondary | ICD-10-CM | POA: Diagnosis not present

## 2024-10-24 DIAGNOSIS — E11649 Type 2 diabetes mellitus with hypoglycemia without coma: Secondary | ICD-10-CM | POA: Diagnosis not present

## 2024-10-24 DIAGNOSIS — A419 Sepsis, unspecified organism: Secondary | ICD-10-CM | POA: Diagnosis present

## 2024-10-24 DIAGNOSIS — I953 Hypotension of hemodialysis: Secondary | ICD-10-CM | POA: Diagnosis not present

## 2024-10-24 DIAGNOSIS — D509 Iron deficiency anemia, unspecified: Secondary | ICD-10-CM | POA: Diagnosis present

## 2024-10-24 DIAGNOSIS — J15212 Pneumonia due to Methicillin resistant Staphylococcus aureus: Secondary | ICD-10-CM | POA: Diagnosis present

## 2024-10-24 DIAGNOSIS — J188 Other pneumonia, unspecified organism: Principal | ICD-10-CM | POA: Diagnosis present

## 2024-10-24 DIAGNOSIS — T444X1A Poisoning by predominantly alpha-adrenoreceptor agonists, accidental (unintentional), initial encounter: Secondary | ICD-10-CM | POA: Diagnosis not present

## 2024-10-24 DIAGNOSIS — D631 Anemia in chronic kidney disease: Secondary | ICD-10-CM | POA: Diagnosis present

## 2024-10-24 DIAGNOSIS — Z515 Encounter for palliative care: Secondary | ICD-10-CM

## 2024-10-24 DIAGNOSIS — I5023 Acute on chronic systolic (congestive) heart failure: Secondary | ICD-10-CM | POA: Diagnosis not present

## 2024-10-24 DIAGNOSIS — J9601 Acute respiratory failure with hypoxia: Secondary | ICD-10-CM | POA: Diagnosis not present

## 2024-10-24 DIAGNOSIS — N2581 Secondary hyperparathyroidism of renal origin: Secondary | ICD-10-CM | POA: Diagnosis present

## 2024-10-24 DIAGNOSIS — E039 Hypothyroidism, unspecified: Secondary | ICD-10-CM | POA: Diagnosis present

## 2024-10-24 DIAGNOSIS — L89152 Pressure ulcer of sacral region, stage 2: Secondary | ICD-10-CM | POA: Diagnosis not present

## 2024-10-24 DIAGNOSIS — E872 Acidosis, unspecified: Secondary | ICD-10-CM | POA: Diagnosis not present

## 2024-10-24 DIAGNOSIS — T80211A Bloodstream infection due to central venous catheter, initial encounter: Principal | ICD-10-CM | POA: Diagnosis present

## 2024-10-24 DIAGNOSIS — R7989 Other specified abnormal findings of blood chemistry: Secondary | ICD-10-CM | POA: Diagnosis present

## 2024-10-24 DIAGNOSIS — I10 Essential (primary) hypertension: Secondary | ICD-10-CM | POA: Diagnosis present

## 2024-10-24 DIAGNOSIS — Y92230 Patient room in hospital as the place of occurrence of the external cause: Secondary | ICD-10-CM | POA: Diagnosis not present

## 2024-10-24 DIAGNOSIS — Z79899 Other long term (current) drug therapy: Secondary | ICD-10-CM

## 2024-10-24 DIAGNOSIS — I252 Old myocardial infarction: Secondary | ICD-10-CM

## 2024-10-24 DIAGNOSIS — I132 Hypertensive heart and chronic kidney disease with heart failure and with stage 5 chronic kidney disease, or end stage renal disease: Secondary | ICD-10-CM | POA: Diagnosis present

## 2024-10-24 DIAGNOSIS — K8020 Calculus of gallbladder without cholecystitis without obstruction: Secondary | ICD-10-CM | POA: Diagnosis present

## 2024-10-24 DIAGNOSIS — I2510 Atherosclerotic heart disease of native coronary artery without angina pectoris: Secondary | ICD-10-CM | POA: Diagnosis present

## 2024-10-24 DIAGNOSIS — J918 Pleural effusion in other conditions classified elsewhere: Secondary | ICD-10-CM | POA: Diagnosis present

## 2024-10-24 DIAGNOSIS — I480 Paroxysmal atrial fibrillation: Secondary | ICD-10-CM | POA: Diagnosis present

## 2024-10-24 DIAGNOSIS — R6521 Severe sepsis with septic shock: Secondary | ICD-10-CM | POA: Diagnosis present

## 2024-10-24 DIAGNOSIS — Z992 Dependence on renal dialysis: Secondary | ICD-10-CM

## 2024-10-24 DIAGNOSIS — Z1152 Encounter for screening for COVID-19: Secondary | ICD-10-CM

## 2024-10-24 DIAGNOSIS — I739 Peripheral vascular disease, unspecified: Secondary | ICD-10-CM | POA: Diagnosis present

## 2024-10-24 DIAGNOSIS — I959 Hypotension, unspecified: Secondary | ICD-10-CM | POA: Diagnosis present

## 2024-10-24 DIAGNOSIS — E441 Mild protein-calorie malnutrition: Secondary | ICD-10-CM | POA: Diagnosis not present

## 2024-10-24 DIAGNOSIS — J90 Pleural effusion, not elsewhere classified: Secondary | ICD-10-CM

## 2024-10-24 DIAGNOSIS — I083 Combined rheumatic disorders of mitral, aortic and tricuspid valves: Secondary | ICD-10-CM | POA: Diagnosis present

## 2024-10-24 DIAGNOSIS — I350 Nonrheumatic aortic (valve) stenosis: Secondary | ICD-10-CM | POA: Diagnosis present

## 2024-10-24 DIAGNOSIS — B9562 Methicillin resistant Staphylococcus aureus infection as the cause of diseases classified elsewhere: Secondary | ICD-10-CM | POA: Diagnosis present

## 2024-10-24 DIAGNOSIS — E66811 Obesity, class 1: Secondary | ICD-10-CM | POA: Diagnosis present

## 2024-10-24 DIAGNOSIS — Z993 Dependence on wheelchair: Secondary | ICD-10-CM

## 2024-10-24 DIAGNOSIS — Z882 Allergy status to sulfonamides status: Secondary | ICD-10-CM

## 2024-10-24 DIAGNOSIS — I447 Left bundle-branch block, unspecified: Secondary | ICD-10-CM | POA: Diagnosis present

## 2024-10-24 DIAGNOSIS — Y848 Other medical procedures as the cause of abnormal reaction of the patient, or of later complication, without mention of misadventure at the time of the procedure: Secondary | ICD-10-CM | POA: Diagnosis present

## 2024-10-24 DIAGNOSIS — R627 Adult failure to thrive: Secondary | ICD-10-CM | POA: Diagnosis present

## 2024-10-24 DIAGNOSIS — L299 Pruritus, unspecified: Secondary | ICD-10-CM | POA: Diagnosis not present

## 2024-10-24 DIAGNOSIS — Z7189 Other specified counseling: Secondary | ICD-10-CM

## 2024-10-24 DIAGNOSIS — E871 Hypo-osmolality and hyponatremia: Secondary | ICD-10-CM | POA: Diagnosis present

## 2024-10-24 DIAGNOSIS — R570 Cardiogenic shock: Secondary | ICD-10-CM | POA: Diagnosis not present

## 2024-10-24 DIAGNOSIS — I5082 Biventricular heart failure: Secondary | ICD-10-CM | POA: Diagnosis present

## 2024-10-24 DIAGNOSIS — N186 End stage renal disease: Secondary | ICD-10-CM | POA: Diagnosis present

## 2024-10-24 DIAGNOSIS — F32A Depression, unspecified: Secondary | ICD-10-CM | POA: Diagnosis not present

## 2024-10-24 DIAGNOSIS — K219 Gastro-esophageal reflux disease without esophagitis: Secondary | ICD-10-CM | POA: Diagnosis present

## 2024-10-24 DIAGNOSIS — Z8673 Personal history of transient ischemic attack (TIA), and cerebral infarction without residual deficits: Secondary | ICD-10-CM

## 2024-10-24 DIAGNOSIS — I472 Ventricular tachycardia, unspecified: Secondary | ICD-10-CM | POA: Diagnosis not present

## 2024-10-24 DIAGNOSIS — N390 Urinary tract infection, site not specified: Secondary | ICD-10-CM | POA: Diagnosis present

## 2024-10-24 DIAGNOSIS — Z6832 Body mass index (BMI) 32.0-32.9, adult: Secondary | ICD-10-CM

## 2024-10-24 DIAGNOSIS — Z89511 Acquired absence of right leg below knee: Secondary | ICD-10-CM

## 2024-10-24 DIAGNOSIS — S88111A Complete traumatic amputation at level between knee and ankle, right lower leg, initial encounter: Secondary | ICD-10-CM | POA: Diagnosis present

## 2024-10-24 DIAGNOSIS — I5022 Chronic systolic (congestive) heart failure: Secondary | ICD-10-CM | POA: Diagnosis present

## 2024-10-24 DIAGNOSIS — M7989 Other specified soft tissue disorders: Secondary | ICD-10-CM | POA: Diagnosis not present

## 2024-10-24 DIAGNOSIS — Z7982 Long term (current) use of aspirin: Secondary | ICD-10-CM

## 2024-10-24 DIAGNOSIS — Z8719 Personal history of other diseases of the digestive system: Secondary | ICD-10-CM

## 2024-10-24 DIAGNOSIS — I9589 Other hypotension: Secondary | ICD-10-CM | POA: Diagnosis present

## 2024-10-24 DIAGNOSIS — I428 Other cardiomyopathies: Secondary | ICD-10-CM | POA: Diagnosis present

## 2024-10-24 DIAGNOSIS — Z8249 Family history of ischemic heart disease and other diseases of the circulatory system: Secondary | ICD-10-CM

## 2024-10-24 DIAGNOSIS — Z794 Long term (current) use of insulin: Secondary | ICD-10-CM

## 2024-10-24 DIAGNOSIS — T827XXA Infection and inflammatory reaction due to other cardiac and vascular devices, implants and grafts, initial encounter: Secondary | ICD-10-CM

## 2024-10-24 DIAGNOSIS — E119 Type 2 diabetes mellitus without complications: Secondary | ICD-10-CM

## 2024-10-24 LAB — CBC WITH DIFFERENTIAL/PLATELET
Abs Immature Granulocytes: 0.06 K/uL (ref 0.00–0.07)
Basophils Absolute: 0.1 K/uL (ref 0.0–0.1)
Basophils Relative: 1 %
Eosinophils Absolute: 0.4 K/uL (ref 0.0–0.5)
Eosinophils Relative: 3 %
HCT: 43.1 % (ref 39.0–52.0)
Hemoglobin: 14 g/dL (ref 13.0–17.0)
Immature Granulocytes: 1 %
Lymphocytes Relative: 8 %
Lymphs Abs: 0.9 K/uL (ref 0.7–4.0)
MCH: 24.5 pg — ABNORMAL LOW (ref 26.0–34.0)
MCHC: 32.5 g/dL (ref 30.0–36.0)
MCV: 75.3 fL — ABNORMAL LOW (ref 80.0–100.0)
Monocytes Absolute: 1.6 K/uL — ABNORMAL HIGH (ref 0.1–1.0)
Monocytes Relative: 14 %
Neutro Abs: 8 K/uL — ABNORMAL HIGH (ref 1.7–7.7)
Neutrophils Relative %: 73 %
Platelets: 146 K/uL — ABNORMAL LOW (ref 150–400)
RBC: 5.72 MIL/uL (ref 4.22–5.81)
RDW: 26.7 % — ABNORMAL HIGH (ref 11.5–15.5)
WBC: 10.9 K/uL — ABNORMAL HIGH (ref 4.0–10.5)
nRBC: 0.2 % (ref 0.0–0.2)

## 2024-10-24 LAB — BASIC METABOLIC PANEL WITH GFR
Anion gap: 21 — ABNORMAL HIGH (ref 5–15)
BUN: 51 mg/dL — ABNORMAL HIGH (ref 8–23)
CO2: 22 mmol/L (ref 22–32)
Calcium: 9.6 mg/dL (ref 8.9–10.3)
Chloride: 87 mmol/L — ABNORMAL LOW (ref 98–111)
Creatinine, Ser: 10.5 mg/dL — ABNORMAL HIGH (ref 0.61–1.24)
GFR, Estimated: 5 mL/min — ABNORMAL LOW (ref >60)
Glucose, Bld: 105 mg/dL — ABNORMAL HIGH (ref 70–99)
Potassium: 3.7 mmol/L (ref 3.5–5.1)
Sodium: 130 mmol/L — ABNORMAL LOW (ref 135–145)

## 2024-10-24 LAB — HEPATITIS B SURFACE ANTIGEN: Hepatitis B Surface Ag: NONREACTIVE

## 2024-10-24 LAB — HEPATIC FUNCTION PANEL
ALT: 10 U/L (ref 0–44)
AST: 23 U/L (ref 15–41)
Albumin: 3 g/dL — ABNORMAL LOW (ref 3.5–5.0)
Alkaline Phosphatase: 118 U/L (ref 38–126)
Bilirubin, Direct: 1.7 mg/dL — ABNORMAL HIGH (ref 0.0–0.2)
Indirect Bilirubin: 0.7 mg/dL (ref 0.3–0.9)
Total Bilirubin: 2.4 mg/dL — ABNORMAL HIGH (ref 0.0–1.2)
Total Protein: 7.8 g/dL (ref 6.5–8.1)

## 2024-10-24 LAB — GLUCOSE, CAPILLARY: Glucose-Capillary: 168 mg/dL — ABNORMAL HIGH (ref 70–99)

## 2024-10-24 LAB — MRSA NEXT GEN BY PCR, NASAL: MRSA by PCR Next Gen: DETECTED — AB

## 2024-10-24 LAB — LACTIC ACID, PLASMA
Lactic Acid, Venous: 2.1 mmol/L (ref 0.5–1.9)
Lactic Acid, Venous: 2.7 mmol/L (ref 0.5–1.9)

## 2024-10-24 LAB — RESP PANEL BY RT-PCR (RSV, FLU A&B, COVID)  RVPGX2
Influenza A by PCR: NEGATIVE
Influenza B by PCR: NEGATIVE
Resp Syncytial Virus by PCR: NEGATIVE
SARS Coronavirus 2 by RT PCR: NEGATIVE

## 2024-10-24 LAB — TROPONIN T, HIGH SENSITIVITY
Troponin T High Sensitivity: 225 ng/L (ref 0–19)
Troponin T High Sensitivity: 216 ng/L (ref 0–19)

## 2024-10-24 MED ORDER — ACETAMINOPHEN 650 MG RE SUPP: 650.0000 mg | Freq: Four times a day (QID) | RECTAL | Status: DC | PRN

## 2024-10-24 MED ORDER — SODIUM CHLORIDE 0.9 % IV BOLUS
250.0000 mL | Freq: Once | INTRAVENOUS | Status: AC
  Administered 2024-10-24: 250 mL via INTRAVENOUS

## 2024-10-24 MED ORDER — PENTAFLUOROPROP-TETRAFLUOROETH EX AERO: 1.0000 | INHALATION_SPRAY | CUTANEOUS | Status: DC | PRN

## 2024-10-24 MED ORDER — MUPIROCIN 2 % EX OINT
1.0000 | TOPICAL_OINTMENT | Freq: Two times a day (BID) | CUTANEOUS | Status: AC
  Administered 2024-10-24 – 2024-10-29 (×10): 1 via NASAL
  Filled 2024-10-24: qty 22

## 2024-10-24 MED ORDER — HEPARIN SODIUM (PORCINE) 1000 UNIT/ML DIALYSIS
1000.0000 [IU] | INTRAMUSCULAR | Status: DC | PRN
  Administered 2024-10-25 – 2024-11-02 (×2): 1000 [IU]
  Filled 2024-10-24: qty 1

## 2024-10-24 MED ORDER — SODIUM CHLORIDE 0.9 % IV SOLN
2.0000 g | Freq: Once | INTRAVENOUS | Status: AC
  Administered 2024-10-24: 2 g via INTRAVENOUS
  Filled 2024-10-24: qty 12.5

## 2024-10-24 MED ORDER — GUAIFENESIN-DM 100-10 MG/5ML PO SYRP
15.0000 mL | ORAL_SOLUTION | Freq: Three times a day (TID) | ORAL | Status: AC
  Administered 2024-10-24 – 2024-10-25 (×3): 15 mL via ORAL
  Filled 2024-10-24 (×3): qty 15

## 2024-10-24 MED ORDER — MIDODRINE HCL 5 MG PO TABS
10.0000 mg | ORAL_TABLET | ORAL | Status: AC
  Administered 2024-10-24: 10 mg via ORAL

## 2024-10-24 MED ORDER — ORAL CARE MOUTH RINSE: 15.0000 mL | OROMUCOSAL | Status: DC | PRN

## 2024-10-24 MED ORDER — SODIUM CHLORIDE 0.9 % IV SOLN
1.0000 g | Freq: Once | INTRAVENOUS | Status: AC
  Administered 2024-10-24: 1 g via INTRAVENOUS
  Filled 2024-10-24: qty 10

## 2024-10-24 MED ORDER — VANCOMYCIN VARIABLE DOSE PER UNSTABLE RENAL FUNCTION (PHARMACIST DOSING): Status: DC

## 2024-10-24 MED ORDER — HEPARIN SODIUM (PORCINE) 1000 UNIT/ML DIALYSIS
20.0000 [IU]/kg | INTRAMUSCULAR | Status: DC | PRN
  Administered 2024-10-25: 2100 [IU] via INTRAVENOUS_CENTRAL
  Filled 2024-10-24: qty 3

## 2024-10-24 MED ORDER — SODIUM CHLORIDE 0.9 % IV BOLUS
500.0000 mL | Freq: Once | INTRAVENOUS | Status: AC
  Administered 2024-10-24: 500 mL via INTRAVENOUS

## 2024-10-24 MED ORDER — HEPARIN SODIUM (PORCINE) 5000 UNIT/ML IJ SOLN
5000.0000 [IU] | Freq: Three times a day (TID) | INTRAMUSCULAR | Status: DC
  Administered 2024-10-24 – 2024-11-30 (×109): 5000 [IU] via SUBCUTANEOUS
  Filled 2024-10-24 (×89): qty 1

## 2024-10-24 MED ORDER — VANCOMYCIN HCL 2000 MG/400ML IV SOLN
2000.0000 mg | Freq: Once | INTRAVENOUS | Status: AC
  Administered 2024-10-24: 2000 mg via INTRAVENOUS
  Filled 2024-10-24: qty 400

## 2024-10-24 MED ORDER — ASPIRIN 81 MG PO TBEC
81.0000 mg | DELAYED_RELEASE_TABLET | Freq: Every day | ORAL | Status: DC
  Administered 2024-10-25 – 2024-11-30 (×35): 81 mg via ORAL
  Filled 2024-10-24 (×30): qty 1

## 2024-10-24 MED ORDER — LIDOCAINE-PRILOCAINE 2.5-2.5 % EX CREA: 1.0000 | TOPICAL_CREAM | CUTANEOUS | Status: DC | PRN

## 2024-10-24 MED ORDER — IPRATROPIUM-ALBUTEROL 0.5-2.5 (3) MG/3ML IN SOLN
3.0000 mL | RESPIRATORY_TRACT | Status: DC | PRN
  Administered 2024-11-04 – 2024-11-20 (×2): 3 mL via RESPIRATORY_TRACT
  Filled 2024-10-24: qty 3

## 2024-10-24 MED ORDER — CHLORHEXIDINE GLUCONATE CLOTH 2 % EX PADS
6.0000 | MEDICATED_PAD | Freq: Every day | CUTANEOUS | Status: DC
  Administered 2024-10-24 – 2024-11-05 (×11): 6 via TOPICAL

## 2024-10-24 MED ORDER — ONDANSETRON HCL 4 MG PO TABS: 4.0000 mg | ORAL_TABLET | Freq: Four times a day (QID) | ORAL | Status: DC | PRN

## 2024-10-24 MED ORDER — ONDANSETRON HCL 4 MG/2ML IJ SOLN: 4.0000 mg | Freq: Four times a day (QID) | INTRAMUSCULAR | Status: DC | PRN

## 2024-10-24 MED ORDER — INSULIN ASPART 100 UNIT/ML IJ SOLN
0.0000 [IU] | Freq: Three times a day (TID) | INTRAMUSCULAR | Status: DC
  Administered 2024-10-25: 1 [IU] via SUBCUTANEOUS
  Administered 2024-10-25: 3 [IU] via SUBCUTANEOUS
  Administered 2024-10-25: 2 [IU] via SUBCUTANEOUS
  Administered 2024-10-26: 3 [IU] via SUBCUTANEOUS
  Administered 2024-10-26: 2 [IU] via SUBCUTANEOUS
  Administered 2024-10-26: 5 [IU] via SUBCUTANEOUS
  Administered 2024-10-27: 3 [IU] via SUBCUTANEOUS
  Administered 2024-10-27: 2 [IU] via SUBCUTANEOUS
  Administered 2024-10-27 – 2024-10-28 (×2): 3 [IU] via SUBCUTANEOUS
  Administered 2024-10-28: 5 [IU] via SUBCUTANEOUS
  Administered 2024-10-28 – 2024-10-29 (×2): 2 [IU] via SUBCUTANEOUS
  Administered 2024-10-29: 5 [IU] via SUBCUTANEOUS
  Administered 2024-10-29: 7 [IU] via SUBCUTANEOUS
  Administered 2024-10-30: 5 [IU] via SUBCUTANEOUS
  Administered 2024-10-30: 2 [IU] via SUBCUTANEOUS
  Administered 2024-10-30: 7 [IU] via SUBCUTANEOUS
  Administered 2024-10-31: 12:00:00 1 [IU] via SUBCUTANEOUS
  Administered 2024-10-31 (×2): 2 [IU] via SUBCUTANEOUS
  Administered 2024-11-01: 12:00:00 5 [IU] via SUBCUTANEOUS
  Administered 2024-11-01: 17:00:00 2 [IU] via SUBCUTANEOUS
  Administered 2024-11-01 – 2024-11-02 (×2): 3 [IU] via SUBCUTANEOUS
  Administered 2024-11-02: 13:00:00 2 [IU] via SUBCUTANEOUS
  Administered 2024-11-02: 08:00:00 9 [IU] via SUBCUTANEOUS
  Administered 2024-11-03: 08:00:00 8 [IU] via SUBCUTANEOUS
  Filled 2024-10-24: qty 8
  Filled 2024-10-24 (×27): qty 1

## 2024-10-24 MED ORDER — CINACALCET HCL 30 MG PO TABS
30.0000 mg | ORAL_TABLET | Freq: Every day | ORAL | Status: DC
  Administered 2024-10-25: 30 mg via ORAL
  Filled 2024-10-24: qty 1

## 2024-10-24 MED ORDER — SODIUM CHLORIDE 0.9 % IV BOLUS
250.0000 mL | INTRAVENOUS | Status: AC
  Administered 2024-10-24: 250 mL via INTRAVENOUS

## 2024-10-24 MED ORDER — SODIUM CHLORIDE 0.9 % IV SOLN
250.0000 mL | INTRAVENOUS | Status: AC
  Administered 2024-10-25: 250 mL via INTRAVENOUS

## 2024-10-24 MED ORDER — GABAPENTIN 300 MG PO CAPS
300.0000 mg | ORAL_CAPSULE | Freq: Every day | ORAL | Status: DC
  Administered 2024-10-25 – 2024-11-01 (×8): 300 mg via ORAL
  Filled 2024-10-24 (×8): qty 1

## 2024-10-24 MED ORDER — SODIUM CHLORIDE 0.9 % IV SOLN: INTRAVENOUS | Status: AC | PRN

## 2024-10-24 MED ORDER — SODIUM CHLORIDE 0.9 % IV SOLN
500.0000 mg | Freq: Once | INTRAVENOUS | Status: AC
  Administered 2024-10-24: 500 mg via INTRAVENOUS
  Filled 2024-10-24: qty 5

## 2024-10-24 MED ORDER — LIDOCAINE HCL (PF) 1 % IJ SOLN: 5.0000 mL | INTRAMUSCULAR | Status: DC | PRN

## 2024-10-24 MED ORDER — MIDODRINE HCL 5 MG PO TABS
10.0000 mg | ORAL_TABLET | Freq: Three times a day (TID) | ORAL | Status: DC
  Administered 2024-10-25: 10 mg via ORAL
  Filled 2024-10-24 (×2): qty 2

## 2024-10-24 MED ORDER — ALTEPLASE 2 MG IJ SOLR: 2.0000 mg | Freq: Once | INTRAMUSCULAR | Status: DC | PRN

## 2024-10-24 MED ORDER — POLYETHYLENE GLYCOL 3350 17 G PO PACK: 17.0000 g | PACK | Freq: Every day | ORAL | Status: DC | PRN

## 2024-10-24 MED ORDER — SODIUM CHLORIDE 0.9 % IV SOLN
1.0000 g | INTRAVENOUS | Status: DC
  Filled 2024-10-24: qty 10

## 2024-10-24 MED ORDER — ACETAMINOPHEN 325 MG PO TABS
650.0000 mg | ORAL_TABLET | Freq: Four times a day (QID) | ORAL | Status: DC | PRN
  Administered 2024-10-25 – 2024-11-29 (×7): 650 mg via ORAL
  Filled 2024-10-24 (×5): qty 2

## 2024-10-24 MED ORDER — INSULIN ASPART 100 UNIT/ML IJ SOLN
0.0000 [IU] | Freq: Every day | INTRAMUSCULAR | Status: DC
  Administered 2024-10-25 – 2024-10-26 (×2): 2 [IU] via SUBCUTANEOUS
  Administered 2024-10-27: 3 [IU] via SUBCUTANEOUS
  Administered 2024-10-28 – 2024-10-29 (×2): 2 [IU] via SUBCUTANEOUS
  Administered 2024-11-01: 23:00:00 3 [IU] via SUBCUTANEOUS
  Administered 2024-11-02 – 2024-11-07 (×2): 2 [IU] via SUBCUTANEOUS
  Filled 2024-10-24 (×3): qty 1
  Filled 2024-10-24: qty 4
  Filled 2024-10-24 (×3): qty 1
  Filled 2024-10-24: qty 2

## 2024-10-24 MED ORDER — NOREPINEPHRINE 4 MG/250ML-% IV SOLN
0.0000 ug/min | INTRAVENOUS | Status: DC
  Administered 2024-10-24: 4 ug/min via INTRAVENOUS
  Administered 2024-10-25: 5 ug/min via INTRAVENOUS
  Administered 2024-10-26: 3 ug/min via INTRAVENOUS
  Administered 2024-10-26: 6 ug/min via INTRAVENOUS
  Administered 2024-10-27: 4 ug/min via INTRAVENOUS
  Administered 2024-10-28: 2 ug/min via INTRAVENOUS
  Administered 2024-11-01: 22:00:00 3 ug/min via INTRAVENOUS
  Administered 2024-11-01: 19:00:00 2 ug/min via INTRAVENOUS
  Administered 2024-11-02: 08:00:00 4 ug/min via INTRAVENOUS
  Filled 2024-10-24 (×7): qty 250

## 2024-10-24 MED ORDER — ANTICOAGULANT SODIUM CITRATE 4% (200MG/5ML) IV SOLN: 5.0000 mL | Status: DC | PRN

## 2024-10-24 MED ORDER — MIDODRINE HCL 5 MG PO TABS
10.0000 mg | ORAL_TABLET | Freq: Once | ORAL | Status: AC
  Administered 2024-10-24: 10 mg via ORAL
  Filled 2024-10-24: qty 2

## 2024-10-24 NOTE — ED Notes (Signed)
 Patient transported to CT

## 2024-10-24 NOTE — ED Notes (Addendum)
Pt stated he does not produce urine

## 2024-10-24 NOTE — H&P (Signed)
 History and Physical    [name] IWANICKI Sr. FMW:[id number] DOB: July 31, 1961 DOA: 10/24/2024  PCP: [name], MD   Patient coming from: Home  I have personally briefly reviewed patient's old medical records in [facility]  Chief Complaint: Difficulty breathing  HPI: [name] WOODRICK Sr. is a 63 y.o. male with medical history significant for ESRD on dialysis, atrial fibrillation, systolic CHF, hypertension, diabetes, LBBB. Patient presented to the ED with complaints of difficulty breathing yesterday about 2 to 3 weeks ago.  He has a bad cough that has been going on for about 15 years, but has worsened from baseline.  Reports some dizziness over the past few days, present to respectable position, and intermittent.  He denies dizziness at this time.  No chest pain, no lower extremity swelling.  Reports he never smoked cigarettes.  His last dialysis was on Friday- 2/5.  He did not get dialyzed today, and has not taken his medications today which include midodrine . Was in the ED 12/2 with reports of dysuria.  He makes very little urine.  He was started on a course of ciprofloxacin  which he started 12/3, and has been compliant.  He was also started on doxycycline  11/30 for chest infiltrate-thought more likely to be fluid related.  ED Course: Tmax 98.6.  Heart rate 66-91.  Respirate rate 16.  Blood pressure systolic initially 130s now down to 60s on my evaluation.  O2 sats greater 93% on room air. Lactic acid 2.1.  WBC 12.9.  Sodium 130.   COVID influenza RSV negative.   Troponin 225 >> 216. CT chest without contrast-multifocal bronchopneumonia, tiny right and moderate partially loculated left pleural effusion.  Collapse/consolidation in left lower lobe may be due to atelectasis, difficult to exclude pneumonia. Nephrology consulted, no emergent need for dialysis. IV ceftriaxone  and azithromycin  started.  500ml N/s bolus given.  Review of Systems: As per HPI all other systems reviewed and  negative.  Past Medical History:  Diagnosis Date   ESRD on hemodialysis (HCC)    a. HD MWF > 10 years.   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 01/2022 Echo: EF 45-50%, glob HK, mod LVH, mildly reduced RV fxn, mild MR, mod-sev MV Ca2+. Mild AS; b. 10/2023 Echo: EF 45-50%, glob HK, GrII DD, mildly reduced RV fxn, RVSP 52.69mmHg. Mild MR/MS, mod TR.   Hypertension    Nonischemic cardiomyopathy (HCC)    a. 2019 Cath [facility]): Nonobstructive disease; b. 12/2019 Echo: EF 50%; c. 12/2019 MV (Tx w/u [facility]): Ant infarct w/ peri-infarct ischemia. EF 35%; c. 05/2021 MV (Tx w/u [facility]): Cor Ca2+, EF 53%, motion artifact, equiv study, likely low risk.   Nonobstructive coronary artery disease    a.  2019 Cath ([place]): Left main normal, LAD 30 proximal, RCA 30 distal, EF 25%.   Obesity    Peripheral arterial disease    Peripheral vascular disease    Type 2 diabetes mellitus (HCC)    a. 10/2023 - admission w/ HHS.    Past Surgical History:  Procedure Laterality Date   A/V FISTULAGRAM N/A 08/15/2019   Procedure: A/V FISTULAGRAM;  Surgeon: Sheree Penne Bruckner, MD;  Location: [facility] INVASIVE CV LAB;  Service: Cardiovascular;  Laterality: N/A;   A/V FISTULAGRAM N/A 01/21/2024   Procedure: A/V Fistulagram;  Surgeon: Melia Lynwood ORN, MD;  Location: [facility] INVASIVE CV LAB;  Service: Cardiovascular;  Laterality: N/A;   AMPUTATION Right 02/02/2022   Procedure: AMPUTATION 5th RAY;  Surgeon:  Joya Stabs, MD;  Location: [facility] OR;  Service: Podiatry;  Laterality: Right;   AMPUTATION Right 05/23/2022   Procedure: RIGHT BELOW KNEE AMPUTATION;  Surgeon: Harden Jerona GAILS, MD;  Location: [facility] OR;  Service: Orthopedics;  Laterality: Right;   AMPUTATION Right 07/04/2022   Procedure: REVISION AMPUTATION BELOW KNEE;  Surgeon: Harden Jerona GAILS, MD;  Location: [facility] OR;  Service: Orthopedics;  Laterality: Right;   APPLICATION OF WOUND VAC  05/23/2022   Procedure: APPLICATION OF WOUND VAC;  Surgeon: Harden Jerona GAILS, MD;  Location: [facility] OR;  Service: Orthopedics;;   AV FISTULA PLACEMENT     CARDIAC CATHETERIZATION  02/17/2018   LHC 02/17/18 ([name]): 30% pLAD, 30% dRCA, LVEDP 25-30, LVEF 25%.     CATARACT EXTRACTION W/PHACO Right 03/29/2020   Procedure: CATARACT EXTRACTION PHACO AND INTRAOCULAR LENS PLACEMENT (IOC)  RIGHT DIABETIC;  Surgeon: Mittie Gaskin, MD;  Location: [facility] ORS;  Service: Ophthalmology;  Laterality: Right;  Lot # [id number] H US : 02:30.9 AP% 9.5% CDE: 14.38   EYE SURGERY Right    cataract removed   [name] N/A 12/02/2023   Procedure: [name];  Surgeon: Federico Rosario BROCKS, MD;  Location: [facility] ENDOSCOPY;  Service: Gastroenterology;  Laterality: N/A;   I & D EXTREMITY Right 02/02/2022   Procedure: IRRIGATION AND DEBRIDEMENT RIGHT FOOT;  Surgeon: Joya Stabs, MD;  Location: [facility] OR;  Service: Podiatry;  Laterality: Right;   INCISION AND DRAINAGE OF WOUND Right 02/08/2022   Procedure: IRRIGATION AND DEBRIDEMENT WOUND;  Surgeon: Burt Fus, DPM;  Location: [facility] OR;  Service: Podiatry;  Laterality: Right;  with removal of infected bone    INSERTION OF DIALYSIS CATHETER  08/13/2019   Procedure: Insertion Of Dialysis Catheter;  Surgeon: Oris Krystal FALCON, MD;  Location: [facility] OR;  Service: Vascular;;   INSERTION OF DIALYSIS CATHETER Left 07/19/2020   Procedure: INSERTION OF DIALYSIS CATHETER;  Surgeon: Sheree Penne Bruckner, MD;  Location: [facility] OR;  Service: Vascular;  Laterality: Left;   IR FLUORO GUIDE CV LINE LEFT  02/16/2024   IR LUMBAR PUNCTURE  11/20/2023   IR US  GUIDE BX ASP/DRAIN  11/07/2022   IR US  GUIDE VASC ACCESS LEFT  02/16/2024   IRRIGATION AND DEBRIDEMENT ABSCESS     PERIPHERAL VASCULAR BALLOON ANGIOPLASTY  08/15/2019   Procedure: PERIPHERAL VASCULAR BALLOON ANGIOPLASTY;  Surgeon: Sheree Penne Bruckner, MD;  Location: [facility] INVASIVE CV LAB;  Service: Cardiovascular;;   PERIPHERAL VASCULAR BALLOON ANGIOPLASTY Left 02/06/2022   Procedure: PERIPHERAL  VASCULAR BALLOON ANGIOPLASTY;  Surgeon: Gretta Bruckner PARAS, MD;  Location: [facility] INVASIVE CV LAB;  Service: Cardiovascular;  Laterality: Left;   PERIPHERAL VASCULAR BALLOON ANGIOPLASTY  01/21/2024   Procedure: PERIPHERAL VASCULAR BALLOON ANGIOPLASTY;  Surgeon: Melia Lynwood ORN, MD;  Location: [facility] INVASIVE CV LAB;  Service: Cardiovascular;;  Cephalic Arch   RADIOLOGY WITH ANESTHESIA N/A 11/20/2023   Procedure: MRI WITH ANESTHESIA;  Surgeon: Radiologist, Medication, MD;  Location: [facility] OR;  Service: Radiology;  Laterality: N/A;   RADIOLOGY WITH ANESTHESIA N/A 11/20/2023   Procedure: IR WITH ANESTHESIA;  Surgeon: Alona Corners, DO;  Location: [facility] OR;  Service: Anesthesiology;  Laterality: N/A;  PLEASE OBTAIN OPENING PRESSURE AND DRAIN AT LEAST 30CC'S OF CSF.   REVISON OF ARTERIOVENOUS FISTULA Left 07/19/2020   Procedure: REVISON/PLICATION OF ARTERIOVENOUS FISTULA LEFT;  Surgeon: Sheree Penne Bruckner, MD;  Location: [facility] OR;  Service: Vascular;  Laterality: Left;   THROMBECTOMY AND REVISION OF ARTERIOVENTOUS (AV) GORETEX  GRAFT Left 08/13/2019   Procedure: THROMBECTOMY OF LEFT ARM  ARTERIOVENTOUS (AV) FISTULA;  Surgeon: Oris Krystal FALCON, MD;  Location: [facility] OR;  Service: Vascular;  Laterality: Left;   TOE AMPUTATION     UPPER EXTREMITY VENOGRAPHY Bilateral 03/30/2024   Procedure: UPPER EXTREMITY VENOGRAPHY;  Surgeon: Lanis Fonda BRAVO, MD;  Location: [facility] INVASIVE CV LAB;  Service: Cardiovascular;  Laterality: Bilateral;     reports that he has never smoked. He has never used smokeless tobacco. He reports current alcohol use. He reports that he does not use drugs.  Allergies  Allergen Reactions   Sulfa Antibiotics Diarrhea and Nausea And Vomiting    Prior to Admission medications   Medication Sig Start Date End Date Taking? Authorizing Provider  acetaminophen  (TYLENOL ) 500 MG tablet Take 1,000 mg by mouth 2 (two) times daily as needed for moderate pain (pain score 4-6), headache or fever.   Yes [provider]   aspirin  EC 81 MG tablet Take 1 tablet (81 mg total) by mouth daily with breakfast. Swallow whole. 02/17/24  Yes Phelix Fudala, Courage, MD  cinacalcet  (SENSIPAR ) 30 MG tablet Take 30 mg by mouth daily.   Yes [provider]  ciprofloxacin  (CIPRO ) 500 MG tablet Take 1 tablet (500 mg total) by mouth 2 (two) times daily. One po bid x 7 days 10/18/24  Yes Delo, Vicenta, MD  doxycycline  (VIBRAMYCIN ) 100 MG capsule Take 1 capsule (100 mg total) by mouth 2 (two) times daily. One po bid x 7 days 10/16/24  Yes Zammit, Joseph, MD  gabapentin  (NEURONTIN ) 300 MG capsule Take 300 mg by mouth daily.   Yes [provider]  insulin  isophane & regular human KwikPen (NOVOLIN  70/30 KWIKPEN) (70-30) 100 UNIT/ML KwikPen Inject 30U in the morning and 25U in the evening Patient taking differently: Inject 25-30 Units into the skin See admin instructions. Inject 30 Units in the morning and 25 Units in the evening 10/27/23  Yes Ezenduka, Nkeiruka J, MD  midodrine  (PROAMATINE ) 10 MG tablet Take 10 mg by mouth 3 (three) times daily. 06/10/24  Yes [provider]  Insulin  Pen Needle 32G X 4 MM MISC Use with insulin  pen. 10/28/23   Donnamarie Lebron PARAS, MD  pantoprazole  (PROTONIX ) 40 MG tablet Take 1 tablet (40 mg total) by mouth 2 (two) times daily. Patient not taking: Reported on 10/24/2024 01/13/24 06/07/24  Verdene Purchase, MD    Physical Exam: Vitals:   10/24/24 1230 10/24/24 1245 10/24/24 1525 10/24/24 1530  BP: (!) 117/104 (!) 108/54 (!) 81/66 (!) 89/69  Pulse: 75 80 91 91  Resp: 17 17  16   Temp:      TempSrc:      SpO2: 95% 96% 93% 93%  Weight:      Height:        Constitutional: Chronically ill-appearing,  Vitals:   10/24/24 1230 10/24/24 1245 10/24/24 1525 10/24/24 1530  BP: (!) 117/104 (!) 108/54 (!) 81/66 (!) 89/69  Pulse: 75 80 91 91  Resp: 17 17  16   Temp:      TempSrc:      SpO2: 95% 96% 93% 93%  Weight:      Height:       Eyes: PERRL, lids and conjunctivae normal ENMT: Mucous  membranes are moist. Posterior pharynx clear of any exudate or lesions.Normal dentition.  Neck: normal, supple, no masses, no thyromegaly Respiratory: coughing, loud dry hacking cough, crackles right base, no wheezing,.  Mild increased work of breathing with severe coughing fits. -Reports has been coughing like this for about 15 years, but worse now.  No Accessory muscle use.  Cardiovascular: Regular rate and rhythm, no murmurs / rubs / gallops. No extremity edema.  Left extremity warm.  HD access left upper chest. Abdomen: no tenderness, no masses palpated. No hepatosplenomegaly. Bowel sounds positive.  Musculoskeletal: no clubbing / cyanosis.  No tone.  Right below-knee amputation. Skin: no rashes, lesions, ulcers. No induration Neurologic: No facial asymmetry, moves extremities spontaneously.  Psychiatric: Normal judgment and insight. Alert and oriented x 3. Normal mood.   Labs on Admission: I have personally reviewed following labs and imaging studies  CBC: Recent Labs  Lab 10/24/24 1014  WBC 10.9*  NEUTROABS 8.0*  HGB 14.0  HCT 43.1  MCV 75.3*  PLT 146*   Basic Metabolic Panel: Recent Labs  Lab 10/24/24 1112  NA 130*  K 3.7  CL 87*  CO2 22  GLUCOSE 105*  BUN 51*  CREATININE 10.50*  CALCIUM  9.6   GFR: Estimated Creatinine Clearance: 9.3 mL/min (A) (by C-G formula based on SCr of 10.5 mg/dL (H)). Liver Function Tests: Recent Labs  Lab 10/24/24 1457  AST 23  ALT 10  ALKPHOS 118  BILITOT 2.4*  PROT 7.8  ALBUMIN  3.0*    Radiological Exams on Admission: CT Chest Wo Contrast Result Date: 10/24/2024 CLINICAL DATA:  Pneumonia. Shortness of breath after dialysis Friday. EXAM: CT CHEST WITHOUT CONTRAST TECHNIQUE: Multidetector CT imaging of the chest was performed following the standard protocol without IV contrast. RADIATION DOSE REDUCTION: This exam was performed according to the departmental dose-optimization program which includes automated exposure control,  adjustment of the mA and/or kV according to patient size and/or use of iterative reconstruction technique. COMPARISON:  11/08/2023. FINDINGS: Cardiovascular: Atherosclerotic calcification of the aorta, aortic valve and coronary arteries. Enlarged pulmonic trunk and heart. No pericardial effusion. Left IJ dialysis catheter terminates in the right atrium. Mediastinum/Nodes: No pathologically enlarged mediastinal or axillary lymph nodes. Hilar regions are difficult to definitively evaluate without IV contrast. Esophagus is grossly unremarkable. Lungs/Pleura: Image quality is degraded by expiratory phase imaging and respiratory motion. Peribronchovascular ground-glass and consolidation in the anterior segment right upper lobe and likely elsewhere within the right middle and right lower lobes. Tiny right and moderate left pleural effusion, the latter of which is partially loculated. Compressive collapse/consolidation in the left lower lobe and to a lesser extent, lingula. There may be tracheobronchomalacia. Upper Abdomen: Liver margin is slightly irregular. Bilateral renal stones and/or renal vascular calcifications. Visualized portions of the liver, gallbladder, adrenal glands, kidneys, spleen, pancreas, stomach and bowel are otherwise grossly unremarkable. No upper abdominal adenopathy. Slight nonspecific haziness in the left upper quadrant omentum. Musculoskeletal: Degenerative changes in the spine. Old sternal fracture. IMPRESSION: 1. Multilobar bronchopneumonia. 2. Tiny right and moderate partially loculated left pleural effusions. Collapse/consolidation in the left lower lobe may be due to atelectasis. Difficult to exclude pneumonia. 3. 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 4. Liver margin is slightly irregular, raising suspicion for cirrhosis. 5. Bilateral renal stones and/or renal vascular calcifications. 6. Aortic atherosclerosis (ICD10-I70.0). Coronary artery calcification. 7. Enlarged pulmonic trunk, indicative of  pulmonary arterial hypertension. Electronically Signed   By: Newell Eke M.D.   On: 10/24/2024 14:15   DG Chest Port 1 View Result Date: 10/24/2024 CLINICAL DATA:  Shortness of breath. EXAM: PORTABLE CHEST 1 VIEW COMPARISON:  10/16/2024 FINDINGS: Low volume film. The cardio pericardial silhouette is enlarged. Retrocardiac collapse/consolidation with effusion is similar to prior. No pulmonary edema. Left IJ central line tip overlies the mid SVC level. IMPRESSION: Low volume film with persistent retrocardiac collapse/consolidation and  effusion. Electronically Signed   By: Camellia Candle M.D.   On: 10/24/2024 11:31   EKG: Independently reviewed.  Sinus arrhythmia, rate 77, QTc 476.  No significant change from prior.  Assessment/Plan Principal Problem:   Multifocal pneumonia Active Problems:   ESRD on dialysis (HCC)   HTN (hypertension)   DM2 (diabetes mellitus, type 2) (HCC)   Below-knee amputation of right lower extremity (HCC)   Type 2 diabetes mellitus with diabetic chronic kidney disease (HCC)   Chronic systolic CHF (congestive heart failure) (HCC)   Essential hypertension   Assessment and Plan:  Multifocal pneumonia-worsening cough, difficulty breathing of ~ 2 weeks duration.  Afebrile.  WBC 10.9.  COVID influenza RSV negative.  Lactic acid 2.1.  Not meeting sepsis criteria.  Blood pressure down to 60s systolic.  CT chest without contrast-multilobar bronchopneumonia, tiny right and moderate partially loculated left pleural effusion.  Collapse/consolidation in left lower lobe may be due to atelectasis.  Difficult to exclude pneumonia.  On ciprofloxacin  which patient started 12/3 and has been compliant, doxycycline  11/30. - Start broad-spectrum antibiotics IV vancomycin  and cefepime  - Follow-up blood cultures - Left thoracentesis with pleural fluid analysis  Hypotension in the setting of ESRD, pneumonia.  Systolic down to 33d on my evaluation.  History of hypertension, not on  antihypertensives. - Trend lactic acid -  Total 750 cc N/s bolus given, hold off on further fluids, if Persistent or recurrent hypotension will need pressors - PTA midodrine  10 mg 3 times daily started  ESRD -schedule Monday Wednesday Friday.  Last HD 12/5.  No clinical or imaging signs of volume overload.  - Nephrology consulted, no emergent need for HD, plan for HD tomorrow. - Resume renal meds  Chronic systolic heart failure-stable and compensated.  Last echo EF 30 to 35%, restrictive grade 3 DD.  Makes little urine. -Per HD  Diabetes-last A1c 3/27 was 8.4 - HgbA1c - SSI- S  Peripheral vascular disease, right below-knee amputation -Resume aspirin    DVT prophylaxis: Heparin  Code Status: FULL Code Family Communication: None at bedside Disposition Plan: ~ 2 days Consults called: Nephrology Admission status: Inpt Stepdown I certify that at the point of admission it is my clinical judgment that the patient will require inpatient hospital care spanning beyond 2 midnights from the point of admission due to high intensity of service, high risk for further deterioration and high frequency of surveillance required.    Author: Tully FORBES Carwin, MD 10/24/2024 4:33 PM  For on call review [email or web address].

## 2024-10-24 NOTE — Plan of Care (Signed)

## 2024-10-24 NOTE — ED Notes (Signed)
Pt states that he does not make urine.  

## 2024-10-24 NOTE — Progress Notes (Signed)
 Informed of ESRD patient in ER. Presents with SOB/PNA, to be admitted. Did not have HD today. OP orders: [facility], MWF. 4 hours 15 minutes. TDC, flow rates: 400/600. EDW 101.9kg. 2K/2.0 calcium . Heparin : 6000 units bolus. Meds: Hectorol  5 mcg every treatment.  Plans: -CT chest w/o contrast reviewed: no evidence of pulm edema/vasc congestion. K WNL--no emergent need for HD -will plan for HD today vs tomorrow (higher census today) then will get back on schedule this coming Wed -will be seen tomorrow in consult -please call with any questions/concerns in the interim  Ephriam Stank, MD [employer]

## 2024-10-24 NOTE — Progress Notes (Addendum)
 Pharmacy Antibiotic Note  [name] [name]. is a 63 y.o. male admitted on 10/24/2024 with shortness of breath.  Pharmacy has been consulted for vancomycin  and cefepime  dosing.  Patient with significant past medical history of ESRD on dialysis, atrial fibrillation, systolic CHF, hypertension, diabetes, LBBB.   Last HD session was 12/5. No immediate plans for HD today. Will follow up plan tomorrow.   Plan: Vancomycin  2g IV now then will plan on 1g after each HD session.  Goal pre-HD trough 15-25 mcg/mL. Cefepime  2g now then 1g q24 hours until HD schedule is noted.   Height: 6' 1 (185.4 cm) Weight: 104.3 kg (230 lb) IBW/kg (Calculated) : 79.9  Temp (24hrs), Avg:98.4 F (36.9 C), Min:98.2 F (36.8 C), Max:98.6 F (37 C)  Recent Labs  Lab 10/24/24 1014 10/24/24 1112 10/24/24 1408  WBC 10.9*  --   --   CREATININE  --  10.50*  --   LATICACIDVEN  --   --  2.1*    Estimated Creatinine Clearance: 9.3 mL/min (A) (by C-G formula based on SCr of 10.5 mg/dL (H)).    Allergies  Allergen Reactions   Sulfa Antibiotics Diarrhea and Nausea And Vomiting    Thank you for allowing pharmacy to be a part of this patient's care.  Dempsey Blush PharmD., BCPS Clinical Pharmacist 10/24/2024 4:39 PM

## 2024-10-24 NOTE — ED Provider Notes (Signed)
 [place] EMERGENCY DEPARTMENT AT [facility] Provider Note   CSN: [id number] Arrival date & time: 10/24/24  9053     Patient presents with: Shortness of Breath   [name] FONTAINE Sr. is a 63 y.o. male.  He is brought in by ambulance from home.  He said he has been short of breath for about a week.  Dialysis does not seem to change it.  Chronic cough nonproductive.  Also feels lightheaded and almost passes out when he stands up.  He said he has had this before.  He is on medication to keep his blood pressure up.  No fevers chills nausea vomiting.  Was seen here for dysuria 6 days ago and put on antibiotics.  He said it was bad last night but not too bad today.  Gets dialysis Monday Wednesday Friday.   The history is provided by the patient and the EMS personnel.  Shortness of Breath Severity:  Moderate Onset quality:  Gradual Duration:  1 week Timing:  Constant Progression:  Unchanged Chronicity:  Recurrent Relieved by:  Nothing Worsened by:  Activity Associated symptoms: cough   Associated symptoms: no abdominal pain, no chest pain, no fever, no hemoptysis, no sputum production and no wheezing   Dizziness Quality:  Lightheadedness Severity:  Moderate Onset quality:  Gradual Duration:  2 days Timing:  Intermittent Progression:  Unchanged Chronicity:  Recurrent Context: standing up   Associated symptoms: shortness of breath   Associated symptoms: no chest pain        Prior to Admission medications   Medication Sig Start Date End Date Taking? Authorizing Provider  acetaminophen  (TYLENOL ) 500 MG tablet Take 1,000 mg by mouth 2 (two) times daily as needed for moderate pain (pain score 4-6), headache or fever.    [provider]  aspirin  EC 81 MG tablet Take 1 tablet (81 mg total) by mouth daily with breakfast. Swallow whole. 02/17/24   Pearlean Manus, MD  atorvastatin  (LIPITOR) 40 MG tablet Take 1 tablet (40 mg total) by mouth daily. 02/17/24 06/07/24  Pearlean Manus, MD  cinacalcet  (SENSIPAR ) 60 MG tablet Take 30 mg by mouth daily. 02/03/24   [provider]  ciprofloxacin  (CIPRO ) 500 MG tablet Take 1 tablet (500 mg total) by mouth 2 (two) times daily. One po bid x 7 days 10/18/24   [name], MD  doxycycline  (VIBRAMYCIN ) 100 MG capsule Take 1 capsule (100 mg total) by mouth 2 (two) times daily. One po bid x 7 days 10/16/24   Zammit, Joseph, MD  insulin  isophane & regular human KwikPen (NOVOLIN  70/30 KWIKPEN) (70-30) 100 UNIT/ML KwikPen Inject 30U in the morning and 25U in the evening Patient taking differently: Inject 25-30 Units into the skin See admin instructions. Inject 30 Units in the morning and 25 Units in the evening 10/27/23   Ezenduka, Nkeiruka J, MD  Insulin  Pen Needle 32G X 4 MM MISC Use with insulin  pen. 10/28/23   Ezenduka, Nkeiruka J, MD  midodrine  (PROAMATINE ) 5 MG tablet Take 3 tablets (15 mg total) by mouth 3 (three) times daily with meals. Patient taking differently: Take 10 mg by mouth 3 (three) times daily with meals. 02/17/24   Pearlean Manus, MD  pantoprazole  (PROTONIX ) 40 MG tablet Take 1 tablet (40 mg total) by mouth 2 (two) times daily. 01/13/24 06/07/24  Krishnan, Gokul, MD  sevelamer  carbonate (RENVELA ) 800 MG tablet Take 4,800 mg by mouth 3 (three) times daily with meals.    [provider]  Allergies: Sulfa antibiotics    Review of Systems  Constitutional:  Negative for fever.  Respiratory:  Positive for cough and shortness of breath. Negative for hemoptysis, sputum production and wheezing.   Cardiovascular:  Negative for chest pain.  Gastrointestinal:  Negative for abdominal pain.  Neurological:  Positive for dizziness.    Updated Vital Signs BP (!) 133/119   Pulse 76   Temp 98.6 F (37 C) (Oral)   Resp 16   Ht 6' 1 (1.854 m)   Wt 104.3 kg   SpO2 93%   BMI 30.34 kg/m   Physical Exam Vitals and nursing note reviewed.  Constitutional:      Appearance: He is well-developed.  HENT:      Head: Normocephalic and atraumatic.  Eyes:     Conjunctiva/sclera: Conjunctivae normal.  Cardiovascular:     Rate and Rhythm: Normal rate and regular rhythm.     Heart sounds: No murmur heard. Pulmonary:     Effort: Pulmonary effort is normal. No respiratory distress.     Breath sounds: Normal breath sounds.  Chest:     Comments: Dialysis catheter chest Abdominal:     Palpations: Abdomen is soft.     Tenderness: There is no abdominal tenderness.  Musculoskeletal:     Cervical back: Neck supple.     Right lower leg: No tenderness.     Left lower leg: No tenderness.     Comments: Fistula left upper arm  Skin:    General: Skin is warm and dry.  Neurological:     General: No focal deficit present.     Mental Status: He is alert.     GCS: GCS eye subscore is 4. GCS verbal subscore is 5. GCS motor subscore is 6.     (all labs ordered are listed, but only abnormal results are displayed) Labs Reviewed  CBC WITH DIFFERENTIAL/PLATELET - Abnormal; Notable for the following components:      Result Value   WBC 10.9 (*)    MCV 75.3 (*)    MCH 24.5 (*)    RDW 26.7 (*)    Platelets 146 (*)    Neutro Abs 8.0 (*)    Monocytes Absolute 1.6 (*)    All other components within normal limits  BASIC METABOLIC PANEL WITH GFR - Abnormal; Notable for the following components:   Sodium 130 (*)    Chloride 87 (*)    Glucose, Bld 105 (*)    BUN 51 (*)    Creatinine, Ser 10.50 (*)    GFR, Estimated 5 (*)    Anion gap 21 (*)    All other components within normal limits  LACTIC ACID, PLASMA - Abnormal; Notable for the following components:   Lactic Acid, Venous 2.1 (*)    All other components within normal limits  HEPATIC FUNCTION PANEL - Abnormal; Notable for the following components:   Albumin  3.0 (*)    Total Bilirubin 2.4 (*)    Bilirubin, Direct 1.7 (*)    All other components within normal limits  TROPONIN T, HIGH SENSITIVITY - Abnormal; Notable for the following components:    Troponin T High Sensitivity 225 (*)    All other components within normal limits  TROPONIN T, HIGH SENSITIVITY - Abnormal; Notable for the following components:   Troponin T High Sensitivity 216 (*)    All other components within normal limits  RESP PANEL BY RT-PCR (RSV, FLU A&B, COVID)  RVPGX2  CULTURE, BLOOD (ROUTINE X 2)  CULTURE, BLOOD (  ROUTINE X 2)  MRSA NEXT GEN BY PCR, NASAL  URINALYSIS, W/ REFLEX TO CULTURE (INFECTION SUSPECTED)  LACTIC ACID, PLASMA  HEPATITIS B SURFACE ANTIGEN  HEPATITIS B SURFACE ANTIBODY, QUANTITATIVE  HEMOGLOBIN A1C    EKG: EKG Interpretation Date/Time:  Monday October 24 2024 09:56:32 EST Ventricular Rate:  77 PR Interval:    QRS Duration:  125 QT Interval:  420 QTC Calculation: 476 R Axis:   214  Text Interpretation: simus arrhythmia IVCD, consider atypical RBBB Inferior infarct, old Probable anterolateral infarct, age indeterm Confirmed by Towana Sharper [phone]) on 10/24/2024 10:00:49 AM  Radiology: CT Chest Wo Contrast Result Date: 10/24/2024 CLINICAL DATA:  Pneumonia. Shortness of breath after dialysis Friday. EXAM: CT CHEST WITHOUT CONTRAST TECHNIQUE: Multidetector CT imaging of the chest was performed following the standard protocol without IV contrast. RADIATION DOSE REDUCTION: This exam was performed according to the departmental dose-optimization program which includes automated exposure control, adjustment of the mA and/or kV according to patient size and/or use of iterative reconstruction technique. COMPARISON:  11/08/2023. FINDINGS: Cardiovascular: Atherosclerotic calcification of the aorta, aortic valve and coronary arteries. Enlarged pulmonic trunk and heart. No pericardial effusion. Left IJ dialysis catheter terminates in the right atrium. Mediastinum/Nodes: No pathologically enlarged mediastinal or axillary lymph nodes. Hilar regions are difficult to definitively evaluate without IV contrast. Esophagus is grossly unremarkable. Lungs/Pleura:  Image quality is degraded by expiratory phase imaging and respiratory motion. Peribronchovascular ground-glass and consolidation in the anterior segment right upper lobe and likely elsewhere within the right middle and right lower lobes. Tiny right and moderate left pleural effusion, the latter of which is partially loculated. Compressive collapse/consolidation in the left lower lobe and to a lesser extent, lingula. There may be tracheobronchomalacia. Upper Abdomen: Liver margin is slightly irregular. Bilateral renal stones and/or renal vascular calcifications. Visualized portions of the liver, gallbladder, adrenal glands, kidneys, spleen, pancreas, stomach and bowel are otherwise grossly unremarkable. No upper abdominal adenopathy. Slight nonspecific haziness in the left upper quadrant omentum. Musculoskeletal: Degenerative changes in the spine. Old sternal fracture. IMPRESSION: 1. Multilobar bronchopneumonia. 2. Tiny right and moderate partially loculated left pleural effusions. Collapse/consolidation in the left lower lobe may be due to atelectasis. Difficult to exclude pneumonia. 3. 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 4. Liver margin is slightly irregular, raising suspicion for cirrhosis. 5. Bilateral renal stones and/or renal vascular calcifications. 6. Aortic atherosclerosis (ICD10-I70.0). Coronary artery calcification. 7. Enlarged pulmonic trunk, indicative of pulmonary arterial hypertension. Electronically Signed   By: Newell Eke M.D.   On: 10/24/2024 14:15   DG Chest Port 1 View Result Date: 10/24/2024 CLINICAL DATA:  Shortness of breath. EXAM: PORTABLE CHEST 1 VIEW COMPARISON:  10/16/2024 FINDINGS: Low volume film. The cardio pericardial silhouette is enlarged. Retrocardiac collapse/consolidation with effusion is similar to prior. No pulmonary edema. Left IJ central line tip overlies the mid SVC level. IMPRESSION: Low volume film with persistent retrocardiac collapse/consolidation and effusion.  Electronically Signed   By: Camellia Candle M.D.   On: 10/24/2024 11:31     Procedures   Medications Ordered in the ED  cefTRIAXone  (ROCEPHIN ) 1 g in sodium chloride  0.9 % 100 mL IVPB (1 g Intravenous New Bag/Given 10/24/24 1451)  azithromycin  (ZITHROMAX ) 500 mg in sodium chloride  0.9 % 250 mL IVPB (has no administration in time range)  sodium chloride  0.9 % bolus 500 mL (500 mLs Intravenous Bolus 10/24/24 1411)    Clinical Course as of 10/24/24 1453  Mon Oct 24, 2024  1029 Chest x-ray interpreted by me as no clear infiltrate.  Awaiting radiology reading. [MB]  1320 Troponins are elevated but flat.  Patient was unable to stand and was partially orthostatic with sitting.  Have ordered some IV fluids.  Chest x-ray read as persistent left lower lobe collapse versus atelectasis.  Have ordered CT. [MB]    Clinical Course User Index [MB] Towana Ozell BROCKS, MD                                 Medical Decision Making Amount and/or Complexity of Data Reviewed Labs: ordered. Radiology: ordered.  Risk Decision regarding hospitalization.   This patient complains of shortness of breath, dizziness near syncope; this involves an extensive number of treatment Options and is a complaint that carries with it a high risk of complications and morbidity. The differential includes sepsis, Sirs, pneumonia, dehydration, metabolic derangement, arrhythmia  I ordered, reviewed and interpreted labs, which included CBC with mildly elevated white count, chemistries consistent with CKD, troponins elevated but flat, lactic acid elevated, blood culture sent, I ordered medication IV antibiotics and fluids and reviewed PMP when indicated. I ordered imaging studies which included chest x-ray, CT chest and I independently    visualized and interpreted imaging which showed multifocal pneumonia Additional history obtained from EMS Previous records obtained and reviewed in epic including recent ED visits I consulted  [facility] hospitalist Dr. Pearlean I and nephrology Dr. Dennise and discussed lab and imaging findings and discussed disposition.  Cardiac monitoring reviewed, sinus rhythm Social determinants considered, no significant barriers Critical Interventions: None  After the interventions stated above, I reevaluated the patient and found patient to be satting well on room air in no distress.  Blood pressure remains soft. Admission and further testing considered, due to his comorbid medical issues feel would benefit from admission to the hospital for further management.  Patient in agreement with plan for admission.      Final diagnoses:  Multifocal pneumonia  Hypotension, unspecified hypotension type  End stage renal disease [facility])    ED Discharge Orders     None          Towana Ozell BROCKS, MD 10/24/24 [phone]

## 2024-10-24 NOTE — ED Triage Notes (Signed)
 Pt bib [facility]cems with complaints of sob that started Friday after dialysis.

## 2024-10-25 DIAGNOSIS — A419 Sepsis, unspecified organism: Secondary | ICD-10-CM | POA: Diagnosis present

## 2024-10-25 LAB — BLOOD CULTURE ID PANEL (REFLEXED) - BCID2

## 2024-10-25 LAB — GLUCOSE, CAPILLARY
Glucose-Capillary: 223 mg/dL — ABNORMAL HIGH (ref 70–99)
Glucose-Capillary: 164 mg/dL — ABNORMAL HIGH (ref 70–99)
Glucose-Capillary: 148 mg/dL — ABNORMAL HIGH (ref 70–99)
Glucose-Capillary: 247 mg/dL — ABNORMAL HIGH (ref 70–99)

## 2024-10-25 LAB — HEMOGLOBIN A1C
Hgb A1c MFr Bld: 8.6 % — ABNORMAL HIGH (ref 4.8–5.6)
Mean Plasma Glucose: 200.12 mg/dL

## 2024-10-25 LAB — RENAL FUNCTION PANEL
Albumin: 3 g/dL — ABNORMAL LOW (ref 3.5–5.0)
Anion gap: 23 — ABNORMAL HIGH (ref 5–15)
BUN: 63 mg/dL — ABNORMAL HIGH (ref 8–23)
CO2: 19 mmol/L — ABNORMAL LOW (ref 22–32)
Calcium: 9.2 mg/dL (ref 8.9–10.3)
Chloride: 88 mmol/L — ABNORMAL LOW (ref 98–111)
Creatinine, Ser: 11.2 mg/dL — ABNORMAL HIGH (ref 0.61–1.24)
GFR, Estimated: 5 mL/min — ABNORMAL LOW (ref >60)
Glucose, Bld: 215 mg/dL — ABNORMAL HIGH (ref 70–99)
Phosphorus: 8 mg/dL — ABNORMAL HIGH (ref 2.5–4.6)
Potassium: 4.1 mmol/L (ref 3.5–5.1)
Sodium: 130 mmol/L — ABNORMAL LOW (ref 135–145)

## 2024-10-25 LAB — CBC
HCT: 37.8 % — ABNORMAL LOW (ref 39.0–52.0)
Hemoglobin: 12.6 g/dL — ABNORMAL LOW (ref 13.0–17.0)
MCH: 24.9 pg — ABNORMAL LOW (ref 26.0–34.0)
MCHC: 33.3 g/dL (ref 30.0–36.0)
MCV: 74.6 fL — ABNORMAL LOW (ref 80.0–100.0)
Platelets: 185 K/uL (ref 150–400)
RBC: 5.07 MIL/uL (ref 4.22–5.81)
RDW: 25.8 % — ABNORMAL HIGH (ref 11.5–15.5)
WBC: 16.1 K/uL — ABNORMAL HIGH (ref 4.0–10.5)
nRBC: 0 % (ref 0.0–0.2)

## 2024-10-25 LAB — LACTIC ACID, PLASMA: Lactic Acid, Venous: 2.5 mmol/L (ref 0.5–1.9)

## 2024-10-25 LAB — HEPATITIS B SURFACE ANTIBODY, QUANTITATIVE: Hep B S AB Quant (Post): 167 m[IU]/mL

## 2024-10-25 MED ORDER — SEVELAMER CARBONATE 800 MG PO TABS
4000.0000 mg | ORAL_TABLET | Freq: Three times a day (TID) | ORAL | Status: DC
  Administered 2024-10-25 – 2024-11-07 (×32): 4000 mg via ORAL
  Filled 2024-10-25 (×36): qty 5

## 2024-10-25 MED ORDER — MIDODRINE HCL 5 MG PO TABS
10.0000 mg | ORAL_TABLET | ORAL | Status: AC
  Administered 2024-10-25: 10 mg via ORAL
  Filled 2024-10-25: qty 2

## 2024-10-25 MED ORDER — SODIUM CHLORIDE 0.9 % IV SOLN: 2.0000 g | INTRAVENOUS | Status: DC

## 2024-10-25 MED ORDER — CINACALCET HCL 30 MG PO TABS
60.0000 mg | ORAL_TABLET | Freq: Every day | ORAL | Status: DC
  Administered 2024-10-26 – 2024-11-07 (×9): 60 mg via ORAL
  Filled 2024-10-25 (×13): qty 2

## 2024-10-25 MED ORDER — VANCOMYCIN HCL IN DEXTROSE 1-5 GM/200ML-% IV SOLN
1000.0000 mg | INTRAVENOUS | Status: DC
  Administered 2024-10-25: 1000 mg via INTRAVENOUS
  Filled 2024-10-25 (×2): qty 200

## 2024-10-25 MED ORDER — MIDODRINE HCL 5 MG PO TABS
20.0000 mg | ORAL_TABLET | Freq: Three times a day (TID) | ORAL | Status: DC
  Administered 2024-10-25 – 2024-10-28 (×10): 20 mg via ORAL
  Filled 2024-10-25 (×10): qty 4

## 2024-10-25 NOTE — Progress Notes (Signed)
 The patient was started on vasopressor, Levophed , due to severe hypotension with MAP in the 40s.  Bed status changed to ICU.  Will continue to closely monitor and treat as indicated.  No charge note.

## 2024-10-25 NOTE — Progress Notes (Addendum)
 PROGRESS NOTE   [name] [name]  FMW:[id number] DOB: [birth year] DOA: 10/24/2024 PCP: [name], MD   Chief Complaint  Patient presents with   Shortness of Breath   Level of care: ICU  Brief Admission History:  63 y.o. male with medical history significant for ESRD on dialysis, atrial fibrillation, systolic CHF, hypertension, diabetes, LBBB.  Patient presented to the ED with complaints of difficulty breathing yesterday about 2 to 3 weeks ago.  He has a bad cough that has been going on for about 15 years, but has worsened from baseline.  Reported some dizziness over the past few days, present to respectable position, and intermittent.  He denies dizziness at this time.  No chest pain, no lower extremity swelling.  Reports he never smoked cigarettes.  His last dialysis was on Friday- 2/5.  He did not get dialyzed today, and has not taken his medications today which include midodrine .  He was in the ED 12/2 with reports of dysuria.  He makes very little urine.  He was started on a course of ciprofloxacin  which he started 12/3, and has been compliant.  He was also started on doxycycline  11/30 for chest infiltrate-thought more likely to be fluid related.   ED Course: Tmax 98.6.  Heart rate 66-91.  Respirate rate 16.  Blood pressure systolic initially 130s now down to 60s on my evaluation.  O2 sats greater 93% on room air.  Lactic acid 2.1.  WBC 12.9.  Sodium 130.  COVID influenza RSV negative.  Troponin 225 >> 216.  CT chest without contrast-multifocal bronchopneumonia, tiny right and moderate partially loculated left pleural effusion.  Collapse/consolidation in left lower lobe may be due to atelectasis, difficult to exclude pneumonia.  Nephrology consulted, no emergent need for dialysis.  IV ceftriaxone  and azithromycin  started.  500ml N/s bolus given.   Assessment and Plan:  Severe sepsis with septic shock  - secondary to multifocal pneumonia and MRSA bacteremia - blood cultures positive for  MRSA - continue IV vancomycin  - ID consultation requested - remains on IV norepinephrine  infusion   MRSA bacteremia  - multiple bottles positive - HD cath will likely need to be removed - TEE work up will need to be completed - follow up ID consult and recommendations - pt will require line holiday as part of management   Chronic hypotension  - he is normally on midodrine  TID - he now presents with sepsis physiology - he is on IV norepinephrine  now - wean off pressor as able  - midodrine  dose increased to 20 mg  ESRD on HD MWF - pt had inpatient HD 12/9 - consultation from nephrologist  Chronic HFrEF - volume management mostly with HD - makes small amount of urine  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   Objective: Vitals:   10/25/24 1245 10/25/24 1300 10/25/24 1315 10/25/24 1330  BP:      Pulse: 93 92  91 91  Resp: 20 19 (!) 22 16  Temp:      TempSrc:      SpO2: 97% 94% 96% 96%  Weight:      Height:        Intake/Output Summary (Last 24 hours) at 10/25/2024 1350 Last data filed at 10/25/2024 1243 Gross per 24 hour  Intake 1719.54 ml  Output 1000 ml  Net 719.54 ml   Filed Weights   10/25/24 0300 10/25/24 0842 10/25/24 1242  Weight: 103.5 kg 103.5 kg 102.5 kg   Examination:  General exam: Appears acutely and chronically ill, calm and comfortable  Respiratory system: posterior rales  heard. Cardiovascular system: normal S1 & S2 heard. No JVD, murmurs, rubs, gallops or clicks. No pedal edema. Gastrointestinal system: Abdomen is nondistended, soft and nontender. No organomegaly or masses felt. Normal bowel sounds heard. Central nervous system: Alert and oriented. No focal neurological deficits. Extremities: Symmetric 5 x 5 power. Skin: No rashes, lesions or ulcers. Psychiatry: Judgement and insight appear normal. Mood & affect appropriate.   Data Reviewed: I have personally reviewed following labs and imaging studies  CBC: Recent Labs  Lab 10/24/24 1014 10/25/24 0442  WBC 10.9* 16.1*  NEUTROABS 8.0*  --   HGB 14.0 12.6*  HCT 43.1 37.8*  MCV 75.3* 74.6*  PLT 146* 185    Basic Metabolic Panel: Recent Labs  Lab 10/24/24 1112 10/25/24 0442  NA 130* 130*  K 3.7 4.1  CL 87* 88*  CO2 22 19*  GLUCOSE 105* 215*  BUN 51* 63*  CREATININE 10.50* 11.20*  CALCIUM  9.6 9.2  PHOS  --  8.0*    CBG: Recent Labs  Lab 10/24/24 2101 10/25/24 0729 10/25/24 1106  GLUCAP 168* 223* 164*    Recent Results (from the past 240 hours)  Resp panel by RT-PCR (RSV, Flu A&B, Covid) Anterior Nasal Swab     Status: None   Collection Time: 10/24/24 10:20 AM   Specimen: Anterior Nasal Swab  Result Value Ref Range Status   SARS Coronavirus 2 by RT PCR NEGATIVE NEGATIVE Final    Comment: (NOTE) SARS-CoV-2 target nucleic acids are NOT DETECTED.  The SARS-CoV-2 RNA is generally detectable in upper respiratory specimens during the acute phase of infection. The lowest concentration of SARS-CoV-2 viral copies this assay can detect is 138 copies/mL. A negative result does not preclude SARS-Cov-2 infection and should not be used as the sole basis for treatment or other patient management decisions. A negative result may occur with  improper specimen collection/handling, submission of specimen other than nasopharyngeal swab, presence of viral mutation(s) within the areas targeted  by this assay, and inadequate number of viral copies(<138 copies/mL). A negative result must be combined with clinical observations, patient history, and epidemiological information. The expected result is Negative.  Fact Sheet for Patients:  bloggercourse.com  Fact Sheet for Healthcare Providers:  seriousbroker.it  This test is no t yet approved or cleared by the [place]  FDA and  has been authorized for detection and/or diagnosis of SARS-CoV-2 by FDA under an Emergency Use Authorization (EUA). This EUA will remain  in effect (meaning this test can be used) for the duration of the COVID-19 declaration under Section 564(b)(1) of the Act, 21 U.S.C.section 360bbb-3(b)(1), unless the authorization is terminated  or revoked sooner.       Influenza A by PCR NEGATIVE NEGATIVE Final   Influenza B by PCR NEGATIVE NEGATIVE Final    Comment: (NOTE) The Xpert Xpress SARS-CoV-2/FLU/RSV plus assay is  intended as an aid in the diagnosis of influenza from Nasopharyngeal swab specimens and should not be used as a sole basis for treatment. Nasal washings and aspirates are unacceptable for Xpert Xpress SARS-CoV-2/FLU/RSV testing.  Fact Sheet for Patients: bloggercourse.com  Fact Sheet for Healthcare Providers: seriousbroker.it  This test is not yet approved or cleared by the [place]  FDA and has been authorized for detection and/or diagnosis of SARS-CoV-2 by FDA under an Emergency Use Authorization (EUA). This EUA will remain in effect (meaning this test can be used) for the duration of the COVID-19 declaration under Section 564(b)(1) of the Act, 21 U.S.C. section 360bbb-3(b)(1), unless the authorization is terminated or revoked.     Resp Syncytial Virus by PCR NEGATIVE NEGATIVE Final    Comment: (NOTE) Fact Sheet for Patients: bloggercourse.com  Fact  Sheet for Healthcare Providers: seriousbroker.it  This test is not yet approved or cleared by the [place]  FDA and has been authorized for detection and/or diagnosis of SARS-CoV-2 by FDA under an Emergency Use Authorization (EUA). This EUA will remain in effect (meaning this test can be used) for the duration of the COVID-19 declaration under Section 564(b)(1) of the Act, 21 U.S.C. section 360bbb-3(b)(1), unless the authorization is terminated or revoked.  Performed at [facility], [address], [place], [place] [address]   Culture, blood (routine x 2)     Status: None (Preliminary result)   Collection Time: 10/24/24  2:07 PM   Specimen: BLOOD RIGHT FOREARM  Result Value Ref Range Status   Specimen Description   Final    BLOOD RIGHT FOREARM BOTTLES DRAWN AEROBIC AND ANAEROBIC   Special Requests   Final    Blood Culture results may not be optimal due to an inadequate volume of blood received in culture bottles   Culture  Setup Time   Final    IN BOTH AEROBIC AND ANAEROBIC BOTTLES GRAM POSITIVE COCCI IN CLUSTERS Gram Stain Report Called to,Read Back By and Verified With: K. [facility] RN 10/25/24 @0709  BY J. WHITE Performed at [facility], [address], [place], [place] [address]    Culture [facility] POSITIVE COCCI  Final   Report Status PENDING  Incomplete  Culture, blood (routine x 2)     Status: None (Preliminary result)   Collection Time: 10/24/24  2:51 PM   Specimen: BLOOD  Result Value Ref Range Status   Specimen Description   Final    BLOOD BLOOD RIGHT ARM Performed at [facility], [address], [place], [place] [address]    Special Requests   Final    BOTTLES DRAWN AEROBIC AND ANAEROBIC Blood Culture adequate volume Performed at [facility], [address] [place], [place] [address]    Culture  Setup Time   Final    IN BOTH AEROBIC AND ANAEROBIC BOTTLES GRAM POSITIVE COCCI IN CLUSTERS Gram Stain Report Called to,Read Back By and Verified  With: K. [facility] RN 10/25/24 @0709  BY J. WHITE CRITICAL RESULT CALLED TO, READ BACK BY AND VERIFIED WITH: PHARMD LAURIE POOLE 10/25/24 @1034  PR Performed at [facility], [address], [place], [place] [address]    Culture [facility] POSITIVE COCCI  Final   Report Status PENDING  Incomplete  Blood Culture ID Panel (Reflexed)     Status: Abnormal   Collection Time: 10/24/24  2:51 PM  Result Value Ref Range Status   Enterococcus faecalis NOT DETECTED NOT DETECTED Final   Enterococcus Faecium NOT DETECTED NOT DETECTED Final   Listeria monocytogenes NOT  DETECTED NOT DETECTED Final   Staphylococcus species DETECTED (A) NOT DETECTED Final    Comment: CRITICAL RESULT CALLED TO, READ BACK BY AND VERIFIED WITH: PHARMD LAURIE POOLE 10/25/24 @1034  PR    Staphylococcus aureus (BCID) DETECTED (A) NOT DETECTED Final    Comment: Methicillin (oxacillin)-resistant Staphylococcus aureus (MRSA). MRSA is predictably resistant to beta-lactam antibiotics (except ceftaroline). Preferred therapy is vancomycin  unless clinically contraindicated. Patient requires contact precautions if  hospitalized. CRITICAL RESULT CALLED TO, READ BACK BY AND VERIFIED WITH: PHARMD LAURIE POOLE 10/25/24 @1034  PR    Staphylococcus epidermidis NOT DETECTED NOT DETECTED Final   Staphylococcus lugdunensis NOT DETECTED NOT DETECTED Final   Streptococcus species NOT DETECTED NOT DETECTED Final   Streptococcus agalactiae NOT DETECTED NOT DETECTED Final   Streptococcus pneumoniae NOT DETECTED NOT DETECTED Final   Streptococcus pyogenes NOT DETECTED NOT DETECTED Final   A.calcoaceticus-baumannii NOT DETECTED NOT DETECTED Final   Bacteroides fragilis NOT DETECTED NOT DETECTED Final   Enterobacterales NOT DETECTED NOT DETECTED Final   Enterobacter cloacae complex NOT DETECTED NOT DETECTED Final   Escherichia coli NOT DETECTED NOT DETECTED Final   Klebsiella aerogenes NOT DETECTED NOT DETECTED Final   Klebsiella oxytoca NOT DETECTED  NOT DETECTED Final   Klebsiella pneumoniae NOT DETECTED NOT DETECTED Final   Proteus species NOT DETECTED NOT DETECTED Final   Salmonella species NOT DETECTED NOT DETECTED Final   Serratia marcescens NOT DETECTED NOT DETECTED Final   Haemophilus influenzae NOT DETECTED NOT DETECTED Final   Neisseria meningitidis NOT DETECTED NOT DETECTED Final   Pseudomonas aeruginosa NOT DETECTED NOT DETECTED Final   Stenotrophomonas maltophilia NOT DETECTED NOT DETECTED Final   Candida albicans NOT DETECTED NOT DETECTED Final   Candida auris NOT DETECTED NOT DETECTED Final   Candida glabrata NOT DETECTED NOT DETECTED Final   Candida krusei NOT DETECTED NOT DETECTED Final   Candida parapsilosis NOT DETECTED NOT DETECTED Final   Candida tropicalis NOT DETECTED NOT DETECTED Final   Cryptococcus neoformans/gattii NOT DETECTED NOT DETECTED Final   Meth resistant mecA/C and MREJ DETECTED (A) NOT DETECTED Final    Comment: CRITICAL RESULT CALLED TO, READ BACK BY AND VERIFIED WITH: PHARMD LAURIE POOLE 10/25/24 @1034  PR Performed at [facility], [address], [place], [place] [address]   MRSA Next Gen by PCR, Nasal     Status: Abnormal   Collection Time: 10/24/24  4:33 PM   Specimen: Nasal Mucosa; Nasal Swab  Result Value Ref Range Status   MRSA by PCR Next Gen DETECTED (A) NOT DETECTED Final    Comment: RESULT CALLED TO, READ BACK BY AND VERIFIED WITH: C KINSLEY,RN@1950  1208/25 MK (NOTE) The GeneXpert MRSA Assay (FDA approved for NASAL specimens only), is one component of a comprehensive MRSA colonization surveillance program. It is not intended to diagnose MRSA infection nor to guide or monitor treatment for MRSA infections. Test performance is not FDA approved in patients less than 1 years old. Performed at [facility], [address], [place], [place] [address]      Radiology Studies: CT Chest Wo Contrast Result Date: 10/24/2024 CLINICAL DATA:  Pneumonia. Shortness of breath after  dialysis Friday. EXAM: CT CHEST WITHOUT CONTRAST TECHNIQUE: Multidetector CT imaging of the chest was performed following the standard protocol without IV contrast. RADIATION DOSE REDUCTION: This exam was performed according to the departmental dose-optimization program which includes automated exposure control, adjustment of the mA and/or kV according to patient size and/or use of iterative reconstruction technique. COMPARISON:  11/08/2023. FINDINGS:  Cardiovascular: Atherosclerotic calcification of the aorta, aortic valve and coronary arteries. Enlarged pulmonic trunk and heart. No pericardial effusion. Left IJ dialysis catheter terminates in the right atrium. Mediastinum/Nodes: No pathologically enlarged mediastinal or axillary lymph nodes. Hilar regions are difficult to definitively evaluate without IV contrast. Esophagus is grossly unremarkable. Lungs/Pleura: Image quality is degraded by expiratory phase imaging and respiratory motion. Peribronchovascular ground-glass and consolidation in the anterior segment right upper lobe and likely elsewhere within the right middle and right lower lobes. Tiny right and moderate left pleural effusion, the latter of which is partially loculated. Compressive collapse/consolidation in the left lower lobe and to a lesser extent, lingula. There may be tracheobronchomalacia. Upper Abdomen: Liver margin is slightly irregular. Bilateral renal stones and/or renal vascular calcifications. Visualized portions of the liver, gallbladder, adrenal glands, kidneys, spleen, pancreas, stomach and bowel are otherwise grossly unremarkable. No upper abdominal adenopathy. Slight nonspecific haziness in the left upper quadrant omentum. Musculoskeletal: Degenerative changes in the spine. Old sternal fracture. IMPRESSION: 1. Multilobar bronchopneumonia. 2. Tiny right and moderate partially loculated left pleural effusions. Collapse/consolidation in the left lower lobe may be due to atelectasis.  Difficult to exclude pneumonia. 3. 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 4. Liver margin is slightly irregular, raising suspicion for cirrhosis. 5. Bilateral renal stones and/or renal vascular calcifications. 6. Aortic atherosclerosis (ICD10-I70.0). Coronary artery calcification. 7. Enlarged pulmonic trunk, indicative of pulmonary arterial hypertension. Electronically Signed   By: Newell Eke M.D.   On: 10/24/2024 14:15   DG Chest Port 1 View Result Date: 10/24/2024 CLINICAL DATA:  Shortness of breath. EXAM: PORTABLE CHEST 1 VIEW COMPARISON:  10/16/2024 FINDINGS: Low volume film. The cardio pericardial silhouette is enlarged. Retrocardiac collapse/consolidation with effusion is similar to prior. No pulmonary edema. Left IJ central line tip overlies the mid SVC level. IMPRESSION: Low volume film with persistent retrocardiac collapse/consolidation and effusion. Electronically Signed   By: Camellia Candle M.D.   On: 10/24/2024 11:31    Scheduled Meds:  aspirin  EC  81 mg Oral Q breakfast   Chlorhexidine  Gluconate Cloth  6 each Topical Q0600   [START ON 10/26/2024] cinacalcet   60 mg Oral Q breakfast   gabapentin   300 mg Oral Daily   heparin   5,000 Units Subcutaneous Q8H   insulin  aspart  0-5 Units Subcutaneous QHS   insulin  aspart  0-9 Units Subcutaneous TID WC   midodrine   20 mg Oral TID   mupirocin  ointment  1 Application Nasal BID   sevelamer  carbonate  4,000 mg Oral TID WC   vancomycin  variable dose per unstable renal function (pharmacist dosing)   Does not apply See admin instructions   Continuous Infusions:  sodium chloride      sodium chloride  10 mL/hr at 10/25/24 0700   anticoagulant sodium citrate      norepinephrine  (LEVOPHED ) Adult infusion 5 mcg/min (10/25/24 1134)   vancomycin        LOS: 1 day   Critical Care Procedure Note Authorized and Performed by: KYM Louder MD  Total Critical Care time:  58 mins Due to a high probability of clinically significant, life threatening  deterioration, the patient required my highest level of preparedness to intervene emergently and I personally spent this critical care time directly and personally managing the patient.  This critical care time included obtaining a history; examining the patient, pulse oximetry; ordering and review of studies; arranging urgent treatment with development of a management plan; evaluation of patient's response of treatment; frequent reassessment; and discussions with other providers.  This critical care time was performed  to assess and manage the high probability of imminent and life threatening deterioration that could result in multi-organ failure.  It was exclusive of separately billable procedures and treating other patients and teaching time.    Afton Louder, MD How to contact the [facility] Attending or Consulting provider 7A - 7P or covering provider during after hours 7P -7A, for this patient?  Check the care team in [facility] and look for a) attending/consulting [facility] provider listed and b) the [facility] team listed Log into [email or web address] to find provider on call.  Locate the [facility] provider you are looking for under [facility] and page to a number that you can be directly reached. If you still have difficulty reaching the provider, please page the [facility] (Director on Call) for the Hospitalists listed on amion for assistance.  10/25/2024, 1:50 PM

## 2024-10-25 NOTE — Plan of Care (Signed)
 This patient remains on AP-ICCU as of time of writing. The patient is AA+Ox4. The patient is receiving supplemental O2 via [address] @ 2 L / min. The patient's BP is noted to be low overnight in the setting of unreliable non-invasive cuff pressures, both automatic and manual. The BP issue was discussed overnight with Dr. Shona and an order was placed for arterial line insertion. An arterial placed was placed in the patient's right radial area by Harlene, RT. The patient was ultimately started on an infusion of levophed  for BP support via PIV. The patient has a mild fever overnight as well.   Problem: Education: Goal: Knowledge of General Education information will improve Description: Including pain rating scale, medication(s)/side effects and non-pharmacologic comfort measures Outcome: Progressing   Problem: Health Behavior/Discharge Planning: Goal: Ability to manage health-related needs will improve Outcome: Progressing   Problem: Clinical Measurements: Goal: Ability to maintain clinical measurements within normal limits will improve Outcome: Progressing Goal: Will remain free from infection Outcome: Progressing Goal: Diagnostic test results will improve Outcome: Progressing Goal: Respiratory complications will improve Outcome: Progressing Goal: Cardiovascular complication will be avoided Outcome: Progressing   Problem: Activity: Goal: Risk for activity intolerance will decrease Outcome: Progressing   Problem: Nutrition: Goal: Adequate nutrition will be maintained Outcome: Progressing   Problem: Coping: Goal: Level of anxiety will decrease Outcome: Progressing   Problem: Elimination: Goal: Will not experience complications related to bowel motility Outcome: Progressing Goal: Will not experience complications related to urinary retention Outcome: Progressing   Problem: Pain Managment: Goal: General experience of comfort will improve and/or be controlled Outcome: Progressing    Problem: Safety: Goal: Ability to remain free from injury will improve Outcome: Progressing   Problem: Skin Integrity: Goal: Risk for impaired skin integrity will decrease Outcome: Progressing   Problem: Activity: Goal: Ability to tolerate increased activity will improve Outcome: Progressing   Problem: Clinical Measurements: Goal: Ability to maintain a body temperature in the normal range will improve Outcome: Progressing   Problem: Respiratory: Goal: Ability to maintain adequate ventilation will improve Outcome: Progressing Goal: Ability to maintain a clear airway will improve Outcome: Progressing   Problem: Education: Goal: Ability to describe self-care measures that may prevent or decrease complications (Diabetes Survival Skills Education) will improve Outcome: Progressing Goal: Individualized Educational Video(s) Outcome: Progressing   Problem: Coping: Goal: Ability to adjust to condition or change in health will improve Outcome: Progressing   Problem: Fluid Volume: Goal: Ability to maintain a balanced intake and output will improve Outcome: Progressing   Problem: Health Behavior/Discharge Planning: Goal: Ability to identify and utilize available resources and services will improve Outcome: Progressing Goal: Ability to manage health-related needs will improve Outcome: Progressing   Problem: Metabolic: Goal: Ability to maintain appropriate glucose levels will improve Outcome: Progressing   Problem: Nutritional: Goal: Maintenance of adequate nutrition will improve Outcome: Progressing Goal: Progress toward achieving an optimal weight will improve Outcome: Progressing   Problem: Skin Integrity: Goal: Risk for impaired skin integrity will decrease Outcome: Progressing   Problem: Tissue Perfusion: Goal: Adequacy of tissue perfusion will improve Outcome: Progressing   Problem: Education: Goal: Knowledge of disease and its progression will improve Outcome:  Progressing Goal: Individualized Educational Video(s) Outcome: Progressing   Problem: Fluid Volume: Goal: Compliance with measures to maintain balanced fluid volume will improve Outcome: Progressing   Problem: Health Behavior/Discharge Planning: Goal: Ability to manage health-related needs will improve Outcome: Progressing   Problem: Nutritional: Goal: Ability to make healthy dietary choices will improve Outcome: Progressing  Problem: Clinical Measurements: Goal: Complications related to the disease process, condition or treatment will be avoided or minimized Outcome: Progressing   Problem: Education: Goal: Knowledge of disease or condition will improve Outcome: Progressing Goal: Understanding of medication regimen will improve Outcome: Progressing Goal: Individualized Educational Video(s) Outcome: Progressing   Problem: Activity: Goal: Ability to tolerate increased activity will improve Outcome: Progressing   Problem: Cardiac: Goal: Ability to achieve and maintain adequate cardiopulmonary perfusion will improve Outcome: Progressing   Problem: Health Behavior/Discharge Planning: Goal: Ability to safely manage health-related needs after discharge will improve Outcome: Progressing   Problem: Education: Goal: Ability to demonstrate management of disease process will improve Outcome: Progressing Goal: Ability to verbalize understanding of medication therapies will improve Outcome: Progressing Goal: Individualized Educational Video(s) Outcome: Progressing   Problem: Activity: Goal: Capacity to carry out activities will improve Outcome: Progressing   Problem: Cardiac: Goal: Ability to achieve and maintain adequate cardiopulmonary perfusion will improve Outcome: Progressing

## 2024-10-25 NOTE — Progress Notes (Signed)
 PHARMACY - PHYSICIAN COMMUNICATION CRITICAL VALUE ALERT - BLOOD CULTURE IDENTIFICATION (BCID)  [name] SEIBERT Sr. is an 63 y.o. male who presented to [facility] on 10/24/2024   Assessment:  MRSA bacteremia  Name of physician (or Provider) Contacted: Dr. Vicci  Current antibiotics: vanco and cefepime   Changes to prescribed antibiotics recommended:  Patient is on recommended antibiotics - No changes needed  Results for orders placed or performed during the hospital encounter of 02/02/22  Blood Culture ID Panel (Reflexed) (Collected: 02/02/2022  1:50 AM)  Result Value Ref Range   Enterococcus faecalis NOT DETECTED NOT DETECTED   Enterococcus Faecium NOT DETECTED NOT DETECTED   Listeria monocytogenes NOT DETECTED NOT DETECTED   Staphylococcus species NOT DETECTED NOT DETECTED   Staphylococcus aureus (BCID) NOT DETECTED NOT DETECTED   Staphylococcus epidermidis NOT DETECTED NOT DETECTED   Staphylococcus lugdunensis NOT DETECTED NOT DETECTED   Streptococcus species NOT DETECTED NOT DETECTED   Streptococcus agalactiae NOT DETECTED NOT DETECTED   Streptococcus pneumoniae NOT DETECTED NOT DETECTED   Streptococcus pyogenes NOT DETECTED NOT DETECTED   A.calcoaceticus-baumannii NOT DETECTED NOT DETECTED   Bacteroides fragilis NOT DETECTED NOT DETECTED   Enterobacterales DETECTED (A) NOT DETECTED   Enterobacter cloacae complex NOT DETECTED NOT DETECTED   Escherichia coli NOT DETECTED NOT DETECTED   Klebsiella aerogenes NOT DETECTED NOT DETECTED   Klebsiella oxytoca NOT DETECTED NOT DETECTED   Klebsiella pneumoniae NOT DETECTED NOT DETECTED   Proteus species NOT DETECTED NOT DETECTED   Salmonella species NOT DETECTED NOT DETECTED   Serratia marcescens NOT DETECTED NOT DETECTED   Haemophilus influenzae NOT DETECTED NOT DETECTED   Neisseria meningitidis NOT DETECTED NOT DETECTED   Pseudomonas aeruginosa NOT DETECTED NOT DETECTED   Stenotrophomonas maltophilia NOT DETECTED NOT DETECTED    Candida albicans NOT DETECTED NOT DETECTED   Candida auris NOT DETECTED NOT DETECTED   Candida glabrata NOT DETECTED NOT DETECTED   Candida krusei NOT DETECTED NOT DETECTED   Candida parapsilosis NOT DETECTED NOT DETECTED   Candida tropicalis NOT DETECTED NOT DETECTED   Cryptococcus neoformans/gattii NOT DETECTED NOT DETECTED   CTX-M ESBL NOT DETECTED NOT DETECTED   Carbapenem resistance IMP NOT DETECTED NOT DETECTED   Carbapenem resistance KPC NOT DETECTED NOT DETECTED   Carbapenem resistance NDM NOT DETECTED NOT DETECTED   Carbapenem resist OXA 48 LIKE NOT DETECTED NOT DETECTED   Carbapenem resistance VIM NOT DETECTED NOT DETECTED    [name] 10/25/2024  10:35 AM

## 2024-10-25 NOTE — Progress Notes (Signed)
 Date and time results received: 10/25/24 0720 (use smartphrase .now to insert current time)  Test: Blood Cultures Critical Value: Gram Positive Cocci in all 4 bottles  Name of Provider Notified: Dr. Vicci  Orders Received? Or Actions Taken?: MD notified

## 2024-10-25 NOTE — Progress Notes (Signed)
 Arterial Catheter Insertion Procedure Note  [name] [name]  [id number]  [birth year]  Date:10/25/24  Time:12:11 AM    Provider Performing: Darryle Harlene Massa    Procedure: Insertion of Arterial Line (63379) without US  guidance  Indication(s) Blood pressure monitoring and/or need for frequent ABGs  Consent Unable to obtain consent due to emergent nature of procedure.  Anesthesia None   Time Out Verified patient identification, verified procedure, site/side was marked, verified correct patient position, special equipment/implants available, medications/allergies/relevant history reviewed, required imaging and test results available.   Sterile Technique Maximal sterile technique including full sterile barrier drape, hand hygiene, sterile gown, sterile gloves, mask, hair covering, sterile ultrasound probe cover (if used).   Procedure Description Area of catheter insertion was cleaned with chlorhexidine  and draped in sterile fashion. Without real-time ultrasound guidance an arterial catheter was placed into the right radial artery.  Appropriate arterial tracings confirmed on monitor.     Complications/Tolerance None; patient tolerated the procedure well.   EBL Minimal   Specimen(s) None

## 2024-10-25 NOTE — Plan of Care (Signed)
 Problem: Education: Goal: Knowledge of General Education information will improve Description: Including pain rating scale, medication(s)/side effects and non-pharmacologic comfort measures Outcome: Progressing   Problem: Health Behavior/Discharge Planning: Goal: Ability to manage health-related needs will improve Outcome: Progressing   Problem: Clinical Measurements: Goal: Ability to maintain clinical measurements within normal limits will improve Outcome: Progressing Goal: Will remain free from infection Outcome: Progressing Goal: Diagnostic test results will improve Outcome: Progressing Goal: Respiratory complications will improve Outcome: Progressing Goal: Cardiovascular complication will be avoided Outcome: Progressing   Problem: Activity: Goal: Risk for activity intolerance will decrease Outcome: Progressing   Problem: Nutrition: Goal: Adequate nutrition will be maintained Outcome: Progressing   Problem: Coping: Goal: Level of anxiety will decrease Outcome: Progressing   Problem: Elimination: Goal: Will not experience complications related to bowel motility Outcome: Progressing Goal: Will not experience complications related to urinary retention Outcome: Progressing   Problem: Pain Managment: Goal: General experience of comfort will improve and/or be controlled Outcome: Progressing   Problem: Safety: Goal: Ability to remain free from injury will improve Outcome: Progressing   Problem: Skin Integrity: Goal: Risk for impaired skin integrity will decrease Outcome: Progressing   Problem: Activity: Goal: Ability to tolerate increased activity will improve Outcome: Progressing   Problem: Clinical Measurements: Goal: Ability to maintain a body temperature in the normal range will improve Outcome: Progressing   Problem: Respiratory: Goal: Ability to maintain adequate ventilation will improve Outcome: Progressing Goal: Ability to maintain a clear airway  will improve Outcome: Progressing   Problem: Education: Goal: Ability to describe self-care measures that may prevent or decrease complications (Diabetes Survival Skills Education) will improve Outcome: Progressing Goal: Individualized Educational Video(s) Outcome: Progressing   Problem: Coping: Goal: Ability to adjust to condition or change in health will improve Outcome: Progressing   Problem: Fluid Volume: Goal: Ability to maintain a balanced intake and output will improve Outcome: Progressing   Problem: Health Behavior/Discharge Planning: Goal: Ability to identify and utilize available resources and services will improve Outcome: Progressing Goal: Ability to manage health-related needs will improve Outcome: Progressing   Problem: Metabolic: Goal: Ability to maintain appropriate glucose levels will improve Outcome: Progressing   Problem: Nutritional: Goal: Maintenance of adequate nutrition will improve Outcome: Progressing Goal: Progress toward achieving an optimal weight will improve Outcome: Progressing   Problem: Skin Integrity: Goal: Risk for impaired skin integrity will decrease Outcome: Progressing   Problem: Tissue Perfusion: Goal: Adequacy of tissue perfusion will improve Outcome: Progressing   Problem: Education: Goal: Knowledge of disease and its progression will improve Outcome: Progressing Goal: Individualized Educational Video(s) Outcome: Progressing   Problem: Fluid Volume: Goal: Compliance with measures to maintain balanced fluid volume will improve Outcome: Progressing   Problem: Health Behavior/Discharge Planning: Goal: Ability to manage health-related needs will improve Outcome: Progressing   Problem: Nutritional: Goal: Ability to make healthy dietary choices will improve Outcome: Progressing   Problem: Clinical Measurements: Goal: Complications related to the disease process, condition or treatment will be avoided or  minimized Outcome: Progressing   Problem: Education: Goal: Knowledge of disease or condition will improve Outcome: Progressing Goal: Understanding of medication regimen will improve Outcome: Progressing Goal: Individualized Educational Video(s) Outcome: Progressing   Problem: Activity: Goal: Ability to tolerate increased activity will improve Outcome: Progressing   Problem: Cardiac: Goal: Ability to achieve and maintain adequate cardiopulmonary perfusion will improve Outcome: Progressing   Problem: Health Behavior/Discharge Planning: Goal: Ability to safely manage health-related needs after discharge will improve Outcome: Progressing   Problem: Education: Goal: Ability  to demonstrate management of disease process will improve Outcome: Progressing Goal: Ability to verbalize understanding of medication therapies will improve Outcome: Progressing Goal: Individualized Educational Video(s) Outcome: Progressing

## 2024-10-25 NOTE — Progress Notes (Signed)
 The patient has completed HD treatment and goal met.  10/25/24 1243  Vitals  Temp 98.1 F (36.7 C)  Temp Source Oral  BP 95/60  Oxygen Therapy  O2 Device Room Air  Post Treatment  Dialyzer Clearance Lightly streaked  Hemodialysis Intake (mL) 0 mL  Liters Processed 80  Fluid Removed (mL) 1000 mL  Tolerated HD Treatment Yes  Post-Hemodialysis Comments see note  Hemodialysis Catheter Left Internal jugular  Placement Date/Time: 02/16/24 1429   Placed prior to admission: No  Serial / Lot #: [id number]  Expiration Date: 08/19/28  Time Out: Correct patient;Correct site;Correct procedure  Maximum sterile barrier precautions: Hand hygiene;Cap;Mask;Sterile gow...  Site Condition No complications  Blue Lumen Status Heparin  locked  Red Lumen Status Heparin  locked  Catheter fill solution Heparin  1000 units/ml  Catheter fill volume (Arterial) 2.1 cc  Catheter fill volume (Venous) 2.1  Dressing Type Transparent  Dressing Status Antimicrobial disc/dressing in place;Clean, Dry, Intact  Interventions New dressing  Drainage Description None  Dressing Change Due 10/31/24  Post treatment catheter status Capped and Clamped

## 2024-10-25 NOTE — TOC Initial Note (Signed)
 Transition of Care (TOC) - Initial/Assessment Note    Patient Details  Name: [name] REESOR Sr. MRN: [id number] Date of Birth: 12-28-1960  Transition of Care [facility]) CM/SW Contact:    Sharlyne Stabs, RN Phone Number: 10/25/2024, 1:33 PM  Clinical Narrative:      Patient admitted with multifocal pneumonia. CM at the beside, patient live with his brother. HD patient, HD in [place] MWF. Patient transfers from bed to wheelchair to get around in the home. He uses medicaid bus to transport to HD. Daughter spoke with RN about SNF. MD updated to order PT eval. Patient is not medically ready for discharge. IPCM following.             Expected Discharge Plan: Skilled Nursing Facility Barriers to Discharge: Continued Medical Work up   Patient Goals and CMS Choice Patient states their goals for this hospitalization and ongoing recovery are:: to get better CMS Medicare.gov Compare Post Acute Care list provided to:: Patient Choice offered to / list presented to : Patient [place] ownership interest in [facility].provided to:: Patient    Expected Discharge Plan and Services      Living arrangements for the past 2 months: Single Family Home                     Prior Living Arrangements/Services Living arrangements for the past 2 months: Single Family Home Lives with:: Siblings Patient language and need for interpreter reviewed:: Yes Do you feel safe going back to the place where you live?: Yes      Need for Family Participation in Patient Care: Yes (Comment) Care giver support system in place?: Yes (comment) Current home services: DME Criminal Activity/Legal Involvement Pertinent to Current Situation/Hospitalization: No - Comment as needed  Activities of Daily Living   ADL Screening (condition at time of admission) Independently performs ADLs?: No Does the patient have a NEW difficulty with bathing/dressing/toileting/self-feeding that is expected to last >3 days?: No Does  the patient have a NEW difficulty with getting in/out of bed, walking, or climbing stairs that is expected to last >3 days?: No Does the patient have a NEW difficulty with communication that is expected to last >3 days?: No Is the patient deaf or have difficulty hearing?: No Does the patient have difficulty seeing, even when wearing glasses/contacts?: No Does the patient have difficulty concentrating, remembering, or making decisions?: No  Permission Sought/Granted            Permission granted to share info w Relationship: Daughter     Emotional Assessment     Affect (typically observed): Accepting Orientation: : Oriented to Self, Oriented to Place, Oriented to Situation Alcohol / Substance Use: Not Applicable Psych Involvement: No (comment)  Admission diagnosis:  Multifocal pneumonia [J18.8] Patient Active Problem List   Diagnosis Date Noted   Multifocal pneumonia 10/24/2024   Gross hematuria 06/07/2024   Dependence on renal dialysis 06/06/2024   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   Late effects of cerebrovascular disease 04/22/2024   Old myocardial infarction 04/22/2024   Uncontrolled diabetes mellitus with hyperglycemia (HCC) 02/11/2024   Orthostatic hypotension 01/12/2024   Iron  deficiency anemia 12/01/2023   Ischemic colitis 11/27/2023   Bloody diarrhea 11/27/2023   Heme positive stool 11/25/2023   Aortic valve stenosis 11/15/2023   Atrial flutter with rapid ventricular response (HCC) 11/10/2023   Repeated falls 10/29/2023   Hyperglycemia 10/24/2023   Disorder of bilirubin metabolism, unspecified 11/20/2022   RUQ abdominal pain 10/30/2022   Cholelithiasis  10/29/2022   Hyperbilirubinemia 10/29/2022   Cellulitis of left lower limb 07/18/2022   Hyperosmolar hyperglycemic state (HHS) (HCC)    Diarrhea    Dehiscence of amputation stump of right lower extremity (HCC)    Abscess of right lower leg    Pressure injury of skin 07/02/2022   Cellulitis of right  leg 07/01/2022   CAD (coronary artery disease) 05/24/2022   Leukocytosis 05/24/2022   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/23/2022   Obesity (BMI 30-39.9) 02/19/2022   History of CVA  02/05/2022   Carotid stenosis 02/05/2022   Hyperkalemia 02/03/2022   Subacute osteomyelitis, right ankle and foot (HCC) 02/02/2022   Protein-calorie malnutrition, mild 02/02/2022   GERD (gastroesophageal reflux disease) 02/02/2022   LBBB (left bundle branch block) 08/13/2019   Problem with vascular access 08/13/2019   Dialysis AV fistula malfunction 08/12/2019   ESRD on dialysis (HCC) 09/20/2018   DM2 (diabetes mellitus, type 2) (HCC) 09/20/2018   HTN (hypertension) 09/20/2018   Hypothyroidism, unspecified 07/27/2018   Secondary hyperparathyroidism of renal origin 07/27/2018   Anemia in chronic kidney disease 07/26/2018   Gout, unspecified 07/23/2018   Type 2 diabetes mellitus with diabetic chronic kidney disease (HCC) 07/23/2018   Chronic systolic CHF (congestive heart failure) (HCC) 07/23/2018   Peripheral vascular disease, unspecified 07/23/2018   Insulin  dependent type 2 diabetes mellitus (HCC) 05/14/2018   Nonischemic cardiomyopathy (HCC) 03/17/2018   Essential hypertension 03/17/2018   PCP:  [name], MD Pharmacy:   [facility] [name] [place] - [address] [place] [address] Phone: [phone] Fax: [phone]    Social Drivers of Health (SDOH) Social History: SDOH Screenings   Food Insecurity: No Food Insecurity (10/24/2024)  Housing: Low Risk  (10/24/2024)  Transportation Needs: No Transportation Needs (10/24/2024)  Utilities: Not At Risk (10/24/2024)  Alcohol Screen: Low Risk  (02/04/2022)  Depression (PHQ2-9): Low Risk  (08/06/2022)  Tobacco Use: Low Risk  (10/24/2024)   SDOH Interventions:    Readmission Risk Interventions    10/25/2024    1:31 PM 02/11/2024    9:09 PM 11/05/2022    3:08 PM  Readmission Risk  Prevention Plan  Transportation Screening Complete Complete Complete  Medication Review Oceanographer) Complete Complete Complete  PCP or Specialist appointment within 3-5 days of discharge Not Complete Complete Complete  HRI or Home Care Consult Complete Complete Complete  SW Recovery Care/Counseling Consult Complete  Complete  Palliative Care Screening Not Applicable Not Applicable Complete  Skilled Nursing Facility Not Complete Complete Complete

## 2024-10-25 NOTE — Consult Note (Addendum)
 Reason for Consult: Continuity of ESRD care Referring Physician: Afton Louder MD [facility])  HPI:  63 year old man with past medical history significant for hypertension, type 2 diabetes mellitus, status post right below-knee amputation, nonobstructive coronary artery disease, congestive heart failure with reduced ejection fraction, obesity and end-stage renal disease on hemodialysis.  Admitted to the hospital yesterday with concerns of increasing difficulty with shortness of breath for the past 2 to 3 weeks with recently worsening chronic cough.  He recently was started on doxycycline  on 11/30 for possible LRTI and then on ciprofloxacin  on 12/3 for urinary tract infection.  He has had some subjective fevers at home with associated chills.  CT scan of the chest yesterday showed multifocal bronchopneumonia with small right and moderate loculated left pleural effusion with some atelectasis.  He was started on CAP coverage with vancomycin  and cefepime .  Overnight noted to be increasingly hypotensive prompting need to initiate Levophed .  He also has positive blood cultures (4 out of 4) for gram-positive cocci which is very concerning in the setting of an indwelling left IJ TDC.  Dialysis prescription: [facility], MWF, 180 dialyzer, 4 hours 15 minutes, BFR 400/DFR 800, EDW 1 1.9 kg, 2K/2.0 calcium , UF profile #4, heparin  6000 unit bolus.  Left IJ TDC.  Hectorol  5 mcg IV q. HD.  On Sensipar  60 mg daily, midodrine  20 mg 3 times daily with option to take extra 20 mg mid to dialysis, Renvela  4000 mg 3 times daily AC.  Past Medical History:  Diagnosis Date   ESRD on hemodialysis (HCC)    a. HD MWF > 10 years.   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 01/2022 Echo: EF 45-50%, glob HK, mod LVH, mildly reduced RV fxn, mild MR, mod-sev MV Ca2+. Mild AS; b. 10/2023 Echo: EF 45-50%, glob HK, GrII DD, mildly reduced RV fxn, RVSP 52.53mmHg. Mild MR/MS, mod TR.   Hypertension     Nonischemic cardiomyopathy (HCC)    a. 2019 Cath [facility]): Nonobstructive disease; b. 12/2019 Echo: EF 50%; c. 12/2019 MV (Tx w/u [facility]): Ant infarct w/ peri-infarct ischemia. EF 35%; c. 05/2021 MV (Tx w/u [facility]): Cor Ca2+, EF 53%, motion artifact, equiv study, likely low risk.   Nonobstructive coronary artery disease    a.  2019 Cath ([place]): Left main normal, LAD 30 proximal, RCA 30 distal, EF 25%.   Obesity    Peripheral arterial disease    Peripheral vascular disease    Type 2 diabetes mellitus (HCC)    a. 10/2023 - admission w/ HHS.    Past Surgical History:  Procedure Laterality Date   A/V FISTULAGRAM N/A 08/15/2019   Procedure: A/V FISTULAGRAM;  Surgeon: Sheree Penne Bruckner, MD;  Location: [facility] INVASIVE CV LAB;  Service: Cardiovascular;  Laterality: N/A;   A/V FISTULAGRAM N/A 01/21/2024   Procedure: A/V Fistulagram;  Surgeon: Melia Lynwood ORN, MD;  Location: [facility] INVASIVE CV LAB;  Service: Cardiovascular;  Laterality: N/A;   AMPUTATION Right 02/02/2022   Procedure: AMPUTATION 5th RAY;  Surgeon: Joya Stabs, MD;  Location: [facility] OR;  Service: Podiatry;  Laterality: Right;   AMPUTATION Right 05/23/2022   Procedure: RIGHT BELOW KNEE AMPUTATION;  Surgeon: Harden Jerona GAILS, MD;  Location: [facility] OR;  Service: Orthopedics;  Laterality: Right;   AMPUTATION Right 07/04/2022   Procedure: REVISION AMPUTATION BELOW KNEE;  Surgeon: Harden Jerona GAILS, MD;  Location: [facility] OR;  Service: Orthopedics;  Laterality: Right;   APPLICATION OF WOUND VAC  05/23/2022  Procedure: APPLICATION OF WOUND VAC;  Surgeon: Harden Jerona GAILS, MD;  Location: [facility] OR;  Service: Orthopedics;;   AV FISTULA PLACEMENT     CARDIAC CATHETERIZATION  02/17/2018   LHC 02/17/18 ([name]): 30% pLAD, 30% dRCA, LVEDP 25-30, LVEF 25%.     CATARACT EXTRACTION W/PHACO Right 03/29/2020   Procedure: CATARACT EXTRACTION PHACO AND INTRAOCULAR LENS PLACEMENT (IOC)  RIGHT DIABETIC;  Surgeon: Mittie Gaskin, MD;  Location: [facility] ORS;   Service: Ophthalmology;  Laterality: Right;  Lot # [id number] H US : 02:30.9 AP% 9.5% CDE: 14.38   EYE SURGERY Right    cataract removed   [name] N/A 12/02/2023   Procedure: [name];  Surgeon: Federico Rosario BROCKS, MD;  Location: [facility] ENDOSCOPY;  Service: Gastroenterology;  Laterality: N/A;   I & D EXTREMITY Right 02/02/2022   Procedure: IRRIGATION AND DEBRIDEMENT RIGHT FOOT;  Surgeon: Joya Stabs, MD;  Location: [facility] OR;  Service: Podiatry;  Laterality: Right;   INCISION AND DRAINAGE OF WOUND Right 02/08/2022   Procedure: IRRIGATION AND DEBRIDEMENT WOUND;  Surgeon: Burt Fus, DPM;  Location: [facility] OR;  Service: Podiatry;  Laterality: Right;  with removal of infected bone    INSERTION OF DIALYSIS CATHETER  08/13/2019   Procedure: Insertion Of Dialysis Catheter;  Surgeon: Oris Krystal FALCON, MD;  Location: [facility] OR;  Service: Vascular;;   INSERTION OF DIALYSIS CATHETER Left 07/19/2020   Procedure: INSERTION OF DIALYSIS CATHETER;  Surgeon: Sheree Penne Bruckner, MD;  Location: [facility] OR;  Service: Vascular;  Laterality: Left;   IR FLUORO GUIDE CV LINE LEFT  02/16/2024   IR LUMBAR PUNCTURE  11/20/2023   IR US  GUIDE BX ASP/DRAIN  11/07/2022   IR US  GUIDE VASC ACCESS LEFT  02/16/2024   IRRIGATION AND DEBRIDEMENT ABSCESS     PERIPHERAL VASCULAR BALLOON ANGIOPLASTY  08/15/2019   Procedure: PERIPHERAL VASCULAR BALLOON ANGIOPLASTY;  Surgeon: Sheree Penne Bruckner, MD;  Location: [facility] INVASIVE CV LAB;  Service: Cardiovascular;;   PERIPHERAL VASCULAR BALLOON ANGIOPLASTY Left 02/06/2022   Procedure: PERIPHERAL VASCULAR BALLOON ANGIOPLASTY;  Surgeon: Gretta Bruckner PARAS, MD;  Location: [facility] INVASIVE CV LAB;  Service: Cardiovascular;  Laterality: Left;   PERIPHERAL VASCULAR BALLOON ANGIOPLASTY  01/21/2024   Procedure: PERIPHERAL VASCULAR BALLOON ANGIOPLASTY;  Surgeon: Melia Lynwood ORN, MD;  Location: [facility] INVASIVE CV LAB;  Service: Cardiovascular;;  Cephalic Arch   RADIOLOGY WITH ANESTHESIA N/A 11/20/2023    Procedure: MRI WITH ANESTHESIA;  Surgeon: Radiologist, Medication, MD;  Location: [facility] OR;  Service: Radiology;  Laterality: N/A;   RADIOLOGY WITH ANESTHESIA N/A 11/20/2023   Procedure: IR WITH ANESTHESIA;  Surgeon: Alona Corners, DO;  Location: [facility] OR;  Service: Anesthesiology;  Laterality: N/A;  PLEASE OBTAIN OPENING PRESSURE AND DRAIN AT LEAST 30CC'S OF CSF.   REVISON OF ARTERIOVENOUS FISTULA Left 07/19/2020   Procedure: REVISON/PLICATION OF ARTERIOVENOUS FISTULA LEFT;  Surgeon: Sheree Penne Bruckner, MD;  Location: [facility] OR;  Service: Vascular;  Laterality: Left;   THROMBECTOMY AND REVISION OF ARTERIOVENTOUS (AV) GORETEX  GRAFT Left 08/13/2019   Procedure: THROMBECTOMY OF LEFT ARM ARTERIOVENTOUS (AV) FISTULA;  Surgeon: Oris Krystal FALCON, MD;  Location: [facility] OR;  Service: Vascular;  Laterality: Left;   TOE AMPUTATION     UPPER EXTREMITY VENOGRAPHY Bilateral 03/30/2024   Procedure: UPPER EXTREMITY VENOGRAPHY;  Surgeon: Lanis Fonda BRAVO, MD;  Location: [facility] INVASIVE CV LAB;  Service: Cardiovascular;  Laterality: Bilateral;    Family History  Problem Relation Age of Onset   Hypertension Mother     Social History:  reports  that he has never smoked. He has never used smokeless tobacco. He reports current alcohol use. He reports that he does not use drugs.  Allergies:  Allergies  Allergen Reactions   Sulfa Antibiotics Diarrhea and Nausea And Vomiting    Medications: Scheduled:  aspirin  EC  81 mg Oral Q breakfast   Chlorhexidine  Gluconate Cloth  6 each Topical Q0600   cinacalcet   30 mg Oral Q breakfast   gabapentin   300 mg Oral Daily   guaiFENesin -dextromethorphan   15 mL Oral Q8H   heparin   5,000 Units Subcutaneous Q8H   insulin  aspart  0-5 Units Subcutaneous QHS   insulin  aspart  0-9 Units Subcutaneous TID WC   midodrine   10 mg Oral TID   mupirocin  ointment  1 Application Nasal BID   vancomycin  variable dose per unstable renal function (pharmacist dosing)   Does not apply See admin instructions    Continuous:  sodium chloride      sodium chloride  10 mL/hr at 10/25/24 0700   anticoagulant sodium citrate      ceFEPime  (MAXIPIME ) IV     norepinephrine  (LEVOPHED ) Adult infusion 6 mcg/min (10/25/24 0700)       Latest Ref Rng & Units 10/25/2024    4:42 AM 10/24/2024   11:12 AM 03/30/2024    7:57 AM  BMP  Glucose 70 - 99 mg/dL 784  894  827   BUN 8 - 23 mg/dL 63  51  44   Creatinine 0.61 - 1.24 mg/dL 88.79  89.49  89.39   Sodium 135 - 145 mmol/L 130  130  133   Potassium 3.5 - 5.1 mmol/L 4.1  3.7  4.6   Chloride 98 - 111 mmol/L 88  87  95   CO2 22 - 32 mmol/L 19  22    Calcium  8.9 - 10.3 mg/dL 9.2  9.6        Latest Ref Rng & Units 10/25/2024    4:42 AM 10/24/2024   10:14 AM 03/30/2024    7:57 AM  CBC  WBC 4.0 - 10.5 K/uL 16.1  10.9    Hemoglobin 13.0 - 17.0 g/dL 87.3  85.9  83.9   Hematocrit 39.0 - 52.0 % 37.8  43.1  47.0   Platelets 150 - 400 K/uL 185  146       CT Chest Wo Contrast Result Date: 10/24/2024 CLINICAL DATA:  Pneumonia. Shortness of breath after dialysis Friday. EXAM: CT CHEST WITHOUT CONTRAST TECHNIQUE: Multidetector CT imaging of the chest was performed following the standard protocol without IV contrast. RADIATION DOSE REDUCTION: This exam was performed according to the departmental dose-optimization program which includes automated exposure control, adjustment of the mA and/or kV according to patient size and/or use of iterative reconstruction technique. COMPARISON:  11/08/2023. FINDINGS: Cardiovascular: Atherosclerotic calcification of the aorta, aortic valve and coronary arteries. Enlarged pulmonic trunk and heart. No pericardial effusion. Left IJ dialysis catheter terminates in the right atrium. Mediastinum/Nodes: No pathologically enlarged mediastinal or axillary lymph nodes. Hilar regions are difficult to definitively evaluate without IV contrast. Esophagus is grossly unremarkable. Lungs/Pleura: Image quality is degraded by expiratory phase imaging and  respiratory motion. Peribronchovascular ground-glass and consolidation in the anterior segment right upper lobe and likely elsewhere within the right middle and right lower lobes. Tiny right and moderate left pleural effusion, the latter of which is partially loculated. Compressive collapse/consolidation in the left lower lobe and to a lesser extent, lingula. There may be tracheobronchomalacia. Upper Abdomen: Liver margin is slightly irregular. Bilateral renal stones  and/or renal vascular calcifications. Visualized portions of the liver, gallbladder, adrenal glands, kidneys, spleen, pancreas, stomach and bowel are otherwise grossly unremarkable. No upper abdominal adenopathy. Slight nonspecific haziness in the left upper quadrant omentum. Musculoskeletal: Degenerative changes in the spine. Old sternal fracture. IMPRESSION: 1. Multilobar bronchopneumonia. 2. Tiny right and moderate partially loculated left pleural effusions. Collapse/consolidation in the left lower lobe may be due to atelectasis. Difficult to exclude pneumonia. 3. 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 4. Liver margin is slightly irregular, raising suspicion for cirrhosis. 5. Bilateral renal stones and/or renal vascular calcifications. 6. Aortic atherosclerosis (ICD10-I70.0). Coronary artery calcification. 7. Enlarged pulmonic trunk, indicative of pulmonary arterial hypertension. Electronically Signed   By: Newell Eke M.D.   On: 10/24/2024 14:15   DG Chest Port 1 View Result Date: 10/24/2024 CLINICAL DATA:  Shortness of breath. EXAM: PORTABLE CHEST 1 VIEW COMPARISON:  10/16/2024 FINDINGS: Low volume film. The cardio pericardial silhouette is enlarged. Retrocardiac collapse/consolidation with effusion is similar to prior. No pulmonary edema. Left IJ central line tip overlies the mid SVC level. IMPRESSION: Low volume film with persistent retrocardiac collapse/consolidation and effusion. Electronically Signed   By: Camellia Candle M.D.   On: 10/24/2024  11:31    Review of Systems  Constitutional:  Positive for chills, fatigue and fever. Negative for appetite change.  HENT:  Negative for nosebleeds, sore throat and trouble swallowing.   Eyes:  Negative for redness and visual disturbance.  Respiratory:  Positive for cough and shortness of breath. Negative for chest tightness.   Cardiovascular:  Negative for chest pain and leg swelling.  Gastrointestinal:  Negative for abdominal pain, blood in stool, diarrhea, nausea and vomiting.  Genitourinary:  Positive for dysuria. Negative for frequency and hematuria.  Musculoskeletal:  Positive for myalgias. Negative for back pain and gait problem.  Skin:  Negative for rash and wound.  Neurological:  Positive for weakness. Negative for light-headedness and headaches.   Blood pressure (!) 59/25, pulse (!) 103, temperature 99.2 F (37.3 C), temperature source Oral, resp. rate 14, height 6' 1 (1.854 m), weight 103.5 kg, SpO2 95%. Physical Exam Vitals and nursing note reviewed.  Constitutional:      Appearance: He is obese. He is ill-appearing. He is not toxic-appearing.  HENT:     Head: Normocephalic and atraumatic.     Mouth/Throat:     Mouth: Mucous membranes are moist.     Pharynx: Oropharynx is clear.  Eyes:     Extraocular Movements: Extraocular movements intact.     Pupils: Pupils are equal, round, and reactive to light.  Neck:     Vascular: No JVD.  Cardiovascular:     Rate and Rhythm: Regular rhythm. Tachycardia present.     Heart sounds: Normal heart sounds.  Pulmonary:     Breath sounds: Examination of the right-lower field reveals decreased breath sounds. Examination of the left-lower field reveals decreased breath sounds. Decreased breath sounds present. No rhonchi or rales.  Abdominal:     General: Bowel sounds are normal.     Palpations: Abdomen is soft. There is no mass.  Musculoskeletal:     Cervical back: Normal range of motion and neck supple.     Left lower leg: No edema.      Comments: Status post right BKA     Assessment/Plan: 1.  Multifocal pneumonia: Negative testing for RSV infection and COVID and started on presumptive treatment for pneumonia based on CT scan findings; on vancomycin  and cefepime .  Concern raised with 4 out of 4 positive  blood cultures with gram-positive cocci.  Awaiting identification/sensitivities. 2.  End-stage renal disease: Typically on MWF dialysis schedule and dialysis postponed to today with high patient census/staffing constraints.  Attempting to wean down Levophed  and will restart his typical dose of midodrine  20 mg 3 times daily.  He does not appear to be hypervolemic on exam or upon review of his imaging.  Will leave him on TTS schedule while here in the hospital to allow for staffing constraints. 3.  Hypotension: Acute on chronic with evidence of sepsis in the setting of infection/bacteremia.  Resume oral midodrine  dose 20 mg 3 times a day and weaning down on Levophed  drip. 4.  Anemia: Hemoglobin and hematocrit currently at acceptable range, no indication for ESA. 5.  Secondary hyperparathyroidism: Calcium  level acceptable with significant hyperphosphatemia-resume sevelamer  for phosphorus binding and Sensipar  for PTH control.  Hold Hectorol  for now.  Gordy MARLA Blanch 10/25/2024, 7:57 AM

## 2024-10-25 NOTE — Consult Note (Signed)
 Virtual Visit via Video Note  I connected with [name] COWIE Sr. on 10/25/2024 by a video enabled telemedicine application and verified that I am speaking with the correct person using two identifiers.  Location: Patient: [facility] 08 Provider: Darryle Law   I discussed the limitations of evaluation and management by telemedicine and the availability of in person appointments. The patient expressed understanding and agreed to proceed.   Date of Admission:  10/24/2024          Reason for Consult: MRSA bacteremia    Referring Provider: CHAMP auto consult and Afton Louder, MD   Assessment:  MRSA bacteremia with HD catheter in place with septic shock requiring pressors Multifocal pneumonia Loculated effusion Rule out endocarditis ESRD on HD Hx of BKA on the right DM Atrial fibrillation   Plan:  Continue vancomycin  Agree with thoracentesis with sending fluid for cell count, differential, protein, glucose, LDH, cultures Repeat blood cultures tomorrow TTE and will need a TEE when able to have this He will need a central line holiday with removal of his arterial line and his HD catheter when safe to do so from hemodynamic and renal standpoint Repeat blood cultures  during line holiday' Monitor for metastatic sites of infection  Contact precautions for MRSA   Principal Problem:   Multifocal pneumonia Active Problems:   ESRD on dialysis (HCC)   DM2 (diabetes mellitus, type 2) (HCC)   MRSA bacteremia   History of CVA    Hypotension   Below-knee amputation of right lower extremity (HCC)   CAD (coronary artery disease)   Type 2 diabetes mellitus with diabetic chronic kidney disease (HCC)   Chronic systolic CHF (congestive heart failure) (HCC)   Peripheral vascular disease, unspecified   Severe sepsis with septic shock   Scheduled Meds:  aspirin  EC  81 mg Oral Q breakfast   Chlorhexidine  Gluconate Cloth  6 each Topical Q0600   [START ON 10/26/2024] cinacalcet   60  mg Oral Q breakfast   gabapentin   300 mg Oral Daily   heparin   5,000 Units Subcutaneous Q8H   insulin  aspart  0-5 Units Subcutaneous QHS   insulin  aspart  0-9 Units Subcutaneous TID WC   midodrine   20 mg Oral TID   mupirocin  ointment  1 Application Nasal BID   sevelamer  carbonate  4,000 mg Oral TID WC   vancomycin  variable dose per unstable renal function (pharmacist dosing)   Does not apply See admin instructions   Continuous Infusions:  sodium chloride      sodium chloride  10 mL/hr at 10/25/24 0700   anticoagulant sodium citrate      norepinephrine  (LEVOPHED ) Adult infusion 5 mcg/min (10/25/24 1134)   vancomycin      PRN Meds:.Place/Maintain arterial line **AND** sodium chloride , acetaminophen  **OR** acetaminophen , alteplase , anticoagulant sodium citrate , heparin , heparin , ipratropium-albuterol , lidocaine  (PF), lidocaine -prilocaine , ondansetron  **OR** ondansetron  (ZOFRAN ) IV, mouth rinse, pentafluoroprop-tetrafluoroeth, polyethylene glycol  HPI: [name] LACHAPELLE Sr. is a 63 y.o. male w ESRD on HD via catheter, atrial fibrillation, DM admitted with symptoms of fevers, chills, weakness admitted to the ICU with septic shock. Blood cultures drawn on admission 2/2 sites MRSA. CT  chest showed multi-lobar pneujmonia and loculated left pleural effusion.  He has been on vancomycin  and cefepime ,  (also received doses of ceftriaxone  and azithromycin ) narrowed to vancomycin .   He has an arterial line in addition to the HD line. He has required pressor support.  Currently he has no other localizing symptoms on questioning him.  He denies any open  wounds, boils or skin problems and I could not see any albeit on limited video view.  Thoracentesis is planned for tomorrow.  I will order TTE and he will need a TEE to evaluate for endocarditis.  When stable it is critical that he have a line holiday with HD catheter and arterial line removed.   I personally spent a total of 80 minutes in the care of  the patient today including preparing to see the patient, getting/reviewing separately obtained history, performing a medically appropriate exam/evaluation, counseling and educating, placing orders, referring and communicating with other health care professionals, documenting clinical information in the EHR, and independently interpreting results.   Evaluation of the patient requires complex antimicrobial therapy evaluation, counseling , isolation needs to reduce disease transmission and risk assessment and mitigation.     Review of Systems: Review of Systems  Constitutional:  Positive for chills, fever and malaise/fatigue. Negative for weight loss.  HENT:  Negative for congestion and sore throat.   Eyes:  Negative for blurred vision and photophobia.  Respiratory:  Negative for cough, shortness of breath and wheezing.   Cardiovascular:  Negative for chest pain, palpitations and leg swelling.  Gastrointestinal:  Negative for abdominal pain, blood in stool, constipation, diarrhea, heartburn, melena, nausea and vomiting.  Genitourinary:  Negative for dysuria, flank pain and hematuria.  Musculoskeletal:  Negative for back pain, falls, joint pain and myalgias.  Skin:  Negative for itching and rash.  Neurological:  Positive for dizziness and weakness. Negative for focal weakness, loss of consciousness and headaches.  Endo/Heme/Allergies:  Does not bruise/bleed easily.  Psychiatric/Behavioral:  Negative for depression and suicidal ideas. The patient does not have insomnia.     Past Medical History:  Diagnosis Date   ESRD on hemodialysis (HCC)    a. HD MWF > 10 years.   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 01/2022 Echo: EF 45-50%, glob HK, mod LVH, mildly reduced RV fxn, mild MR, mod-sev MV Ca2+. Mild AS; b. 10/2023 Echo: EF 45-50%, glob HK, GrII DD, mildly reduced RV fxn, RVSP 52.17mmHg. Mild MR/MS, mod TR.   Hypertension    Nonischemic cardiomyopathy (HCC)    a. 2019 Cath  [facility]): Nonobstructive disease; b. 12/2019 Echo: EF 50%; c. 12/2019 MV (Tx w/u [facility]): Ant infarct w/ peri-infarct ischemia. EF 35%; c. 05/2021 MV (Tx w/u [facility]): Cor Ca2+, EF 53%, motion artifact, equiv study, likely low risk.   Nonobstructive coronary artery disease    a.  2019 Cath ([facility]): Left main normal, LAD 30 proximal, RCA 30 distal, EF 25%.   Obesity    Peripheral arterial disease    Peripheral vascular disease    Type 2 diabetes mellitus (HCC)    a. 10/2023 - admission w/ [facility].    Social History   Tobacco Use   Smoking status: Never   Smokeless tobacco: Never  Vaping Use   Vaping status: Never Used  Substance Use Topics   Alcohol use: Yes   Drug use: Never    Family History  Problem Relation Age of Onset   Hypertension Mother    Allergies  Allergen Reactions   Sulfa Antibiotics Diarrhea and Nausea And Vomiting    OBJECTIVE: Blood pressure 95/60, pulse 89, temperature 98.1 F (36.7 C), temperature source Oral, resp. rate (!) 21, height 6' 1 (1.854 m), weight 102.5 kg, SpO2 94%.  Physical Exam Constitutional:      Appearance: He is well-developed.  HENT:  Head: Normocephalic and atraumatic.  Eyes:     Conjunctiva/sclera: Conjunctivae normal.  Cardiovascular:     Rate and Rhythm: Normal rate and regular rhythm.  Pulmonary:     Effort: Pulmonary effort is normal. No respiratory distress.     Breath sounds: No wheezing.  Abdominal:     General: There is no distension.     Palpations: Abdomen is soft.  Musculoskeletal:        General: No tenderness. Normal range of motion.     Cervical back: Normal range of motion and neck supple.  Skin:    General: Skin is warm and dry.     Coloration: Skin is not pale.     Findings: No erythema.  Neurological:     General: No focal deficit present.     Mental Status: He is alert and oriented to person, place, and time.  Psychiatric:        Mood and Affect: Mood normal.        Behavior:  Behavior normal.        Thought Content: Thought content normal.        Judgment: Judgment normal.     Lab Results Lab Results  Component Value Date   WBC 16.1 (H) 10/25/2024   HGB 12.6 (L) 10/25/2024   HCT 37.8 (L) 10/25/2024   MCV 74.6 (L) 10/25/2024   PLT 185 10/25/2024    Lab Results  Component Value Date   CREATININE 11.20 (H) 10/25/2024   BUN 63 (H) 10/25/2024   NA 130 (L) 10/25/2024   K 4.1 10/25/2024   CL 88 (L) 10/25/2024   CO2 19 (L) 10/25/2024    Lab Results  Component Value Date   ALT 10 10/24/2024   AST 23 10/24/2024   GGT 293 (H) 10/31/2022   ALKPHOS 118 10/24/2024   BILITOT 2.4 (H) 10/24/2024     Microbiology: Recent Results (from the past 240 hours)  Resp panel by RT-PCR (RSV, Flu A&B, Covid) Anterior Nasal Swab     Status: None   Collection Time: 10/24/24 10:20 AM   Specimen: Anterior Nasal Swab  Result Value Ref Range Status   SARS Coronavirus 2 by RT PCR NEGATIVE NEGATIVE Final    Comment: (NOTE) SARS-CoV-2 target nucleic acids are NOT DETECTED.  The SARS-CoV-2 RNA is generally detectable in upper respiratory specimens during the acute phase of infection. The lowest concentration of SARS-CoV-2 viral copies this assay can detect is 138 copies/mL. A negative result does not preclude SARS-Cov-2 infection and should not be used as the sole basis for treatment or other patient management decisions. A negative result may occur with  improper specimen collection/handling, submission of specimen other than nasopharyngeal swab, presence of viral mutation(s) within the areas targeted by this assay, and inadequate number of viral copies(<138 copies/mL). A negative result must be combined with clinical observations, patient history, and epidemiological information. The expected result is Negative.  Fact Sheet for Patients:  bloggercourse.com  Fact Sheet for Healthcare Providers:   seriousbroker.it  This test is no t yet approved or cleared by the [place]  FDA and  has been authorized for detection and/or diagnosis of SARS-CoV-2 by FDA under an Emergency Use Authorization (EUA). This EUA will remain  in effect (meaning this test can be used) for the duration of the COVID-19 declaration under Section 564(b)(1) of the Act, 21 U.S.C.section 360bbb-3(b)(1), unless the authorization is terminated  or revoked sooner.       Influenza A by PCR NEGATIVE NEGATIVE  Final   Influenza B by PCR NEGATIVE NEGATIVE Final    Comment: (NOTE) The Xpert Xpress SARS-CoV-2/FLU/RSV plus assay is intended as an aid in the diagnosis of influenza from Nasopharyngeal swab specimens and should not be used as a sole basis for treatment. Nasal washings and aspirates are unacceptable for Xpert Xpress SARS-CoV-2/FLU/RSV testing.  Fact Sheet for Patients: bloggercourse.com  Fact Sheet for Healthcare Providers: seriousbroker.it  This test is not yet approved or cleared by the [place]  FDA and has been authorized for detection and/or diagnosis of SARS-CoV-2 by FDA under an Emergency Use Authorization (EUA). This EUA will remain in effect (meaning this test can be used) for the duration of the COVID-19 declaration under Section 564(b)(1) of the Act, 21 U.S.C. section 360bbb-3(b)(1), unless the authorization is terminated or revoked.     Resp Syncytial Virus by PCR NEGATIVE NEGATIVE Final    Comment: (NOTE) Fact Sheet for Patients: bloggercourse.com  Fact Sheet for Healthcare Providers: seriousbroker.it  This test is not yet approved or cleared by the [place]  FDA and has been authorized for detection and/or diagnosis of SARS-CoV-2 by FDA under an Emergency Use Authorization (EUA). This EUA will remain in effect (meaning this test can be used) for  the duration of the COVID-19 declaration under Section 564(b)(1) of the Act, 21 U.S.C. section 360bbb-3(b)(1), unless the authorization is terminated or revoked.  Performed at [facility], [address] [place], [place] [address]   Culture, blood (routine x 2)     Status: None (Preliminary result)   Collection Time: 10/24/24  2:07 PM   Specimen: BLOOD RIGHT FOREARM  Result Value Ref Range Status   Specimen Description   Final    BLOOD RIGHT FOREARM BOTTLES DRAWN AEROBIC AND ANAEROBIC   Special Requests   Final    Blood Culture results may not be optimal due to an inadequate volume of blood received in culture bottles   Culture  Setup Time   Final    IN BOTH AEROBIC AND ANAEROBIC BOTTLES GRAM POSITIVE COCCI IN CLUSTERS Gram Stain Report Called to,Read Back By and Verified With: K. [facility] RN 10/25/24 @0709  BY J. WHITE Performed at [facility], [address] [place], [place] [address]    Culture [facility] POSITIVE COCCI  Final   Report Status PENDING  Incomplete  Culture, blood (routine x 2)     Status: None (Preliminary result)   Collection Time: 10/24/24  2:51 PM   Specimen: BLOOD  Result Value Ref Range Status   Specimen Description   Final    BLOOD BLOOD RIGHT ARM Performed at [facility], [address] [place], [place] [address]    Special Requests   Final    BOTTLES DRAWN AEROBIC AND ANAEROBIC Blood Culture adequate volume Performed at [facility], [address], [place], [place] [address]    Culture  Setup Time   Final    IN BOTH AEROBIC AND ANAEROBIC BOTTLES GRAM POSITIVE COCCI IN CLUSTERS Gram Stain Report Called to,Read Back By and Verified With: K. [facility] RN 10/25/24 @0709  BY J. WHITE CRITICAL RESULT CALLED TO, READ BACK BY AND VERIFIED WITH: PHARMD LAURIE POOLE 10/25/24 @1034  PR Performed at [facility], [address], [place], [place] [address]    Culture [facility] POSITIVE COCCI  Final   Report Status PENDING  Incomplete  Blood Culture ID Panel (Reflexed)      Status: Abnormal   Collection Time: 10/24/24  2:51 PM  Result Value Ref Range Status  Enterococcus faecalis NOT DETECTED NOT DETECTED Final   Enterococcus Faecium NOT DETECTED NOT DETECTED Final   Listeria monocytogenes NOT DETECTED NOT DETECTED Final   Staphylococcus species DETECTED (A) NOT DETECTED Final    Comment: CRITICAL RESULT CALLED TO, READ BACK BY AND VERIFIED WITH: PHARMD LAURIE POOLE 10/25/24 @1034  PR    Staphylococcus aureus (BCID) DETECTED (A) NOT DETECTED Final    Comment: Methicillin (oxacillin)-resistant Staphylococcus aureus (MRSA). MRSA is predictably resistant to beta-lactam antibiotics (except ceftaroline). Preferred therapy is vancomycin  unless clinically contraindicated. Patient requires contact precautions if  hospitalized. CRITICAL RESULT CALLED TO, READ BACK BY AND VERIFIED WITH: PHARMD LAURIE POOLE 10/25/24 @1034  PR    Staphylococcus epidermidis NOT DETECTED NOT DETECTED Final   Staphylococcus lugdunensis NOT DETECTED NOT DETECTED Final   Streptococcus species NOT DETECTED NOT DETECTED Final   Streptococcus agalactiae NOT DETECTED NOT DETECTED Final   Streptococcus pneumoniae NOT DETECTED NOT DETECTED Final   Streptococcus pyogenes NOT DETECTED NOT DETECTED Final   A.calcoaceticus-baumannii NOT DETECTED NOT DETECTED Final   Bacteroides fragilis NOT DETECTED NOT DETECTED Final   Enterobacterales NOT DETECTED NOT DETECTED Final   Enterobacter cloacae complex NOT DETECTED NOT DETECTED Final   Escherichia coli NOT DETECTED NOT DETECTED Final   Klebsiella aerogenes NOT DETECTED NOT DETECTED Final   Klebsiella oxytoca NOT DETECTED NOT DETECTED Final   Klebsiella pneumoniae NOT DETECTED NOT DETECTED Final   Proteus species NOT DETECTED NOT DETECTED Final   Salmonella species NOT DETECTED NOT DETECTED Final   Serratia marcescens NOT DETECTED NOT DETECTED Final   Haemophilus influenzae NOT DETECTED NOT DETECTED Final   Neisseria meningitidis NOT DETECTED NOT  DETECTED Final   Pseudomonas aeruginosa NOT DETECTED NOT DETECTED Final   Stenotrophomonas maltophilia NOT DETECTED NOT DETECTED Final   Candida albicans NOT DETECTED NOT DETECTED Final   Candida auris NOT DETECTED NOT DETECTED Final   Candida glabrata NOT DETECTED NOT DETECTED Final   Candida krusei NOT DETECTED NOT DETECTED Final   Candida parapsilosis NOT DETECTED NOT DETECTED Final   Candida tropicalis NOT DETECTED NOT DETECTED Final   Cryptococcus neoformans/gattii NOT DETECTED NOT DETECTED Final   Meth resistant mecA/C and MREJ DETECTED (A) NOT DETECTED Final    Comment: CRITICAL RESULT CALLED TO, READ BACK BY AND VERIFIED WITH: PHARMD LAURIE POOLE 10/25/24 @1034  PR Performed at [facility], [address] [place], [place] [address]   MRSA Next Gen by PCR, Nasal     Status: Abnormal   Collection Time: 10/24/24  4:33 PM   Specimen: Nasal Mucosa; Nasal Swab  Result Value Ref Range Status   MRSA by PCR Next Gen DETECTED (A) NOT DETECTED Final    Comment: RESULT CALLED TO, READ BACK BY AND VERIFIED WITH: C KINSLEY,RN@1950  1208/25 MK (NOTE) The GeneXpert MRSA Assay (FDA approved for NASAL specimens only), is one component of a comprehensive MRSA colonization surveillance program. It is not intended to diagnose MRSA infection nor to guide or monitor treatment for MRSA infections. Test performance is not FDA approved in patients less than 45 years old. Performed at [facility], [address], [place], [place] [address]     Jomarie Fleeta Rothman, MD [facility] [phone] pager  10/25/2024, 3:41 PM

## 2024-10-25 NOTE — Hospital Course (Addendum)
 [name] [name] is a 63 y.o. male who has a PMH as outlined below including but not limited to ESRD on HD MWF, A-fib, systolic CHF, hypertension, LBBB, DM.  He presented to [facility] ED on 10/24/2024 with dyspnea x roughly 2-3 weeks. He had been seen in the ED a few days early on 12/2 for dysuria and was started on a course of Cipro  which she started the following day and stated that he had been taking as prescribed.     CT chest that demonstrated multifocal pneumonia and small effusions.  Blood cultures ultimately grew MRSA.  He had been on vancomycin  which was continued. He had a left thora 12/10 with 450cc fluid removed.  His HD catheter was removed on 10/28/2024 as well as an arterial line.  He did have a TTE on 12/11 that showed an EF of 20-25%, G2 DD, mild decrease in RV SF, severe AAS, no vegetations.  TEE was recommended but had been deferred due to patient requiring vasopressor support.  ID was consulted and recommended line holiday.  He had a temporary femoral HD cath placed on 12/15.  ID recommendations included 6 weeks of vancomycin  to end on 12/09/2024.    12/17, he was transferred to [facility] for ongoing management for septic shock as well as for IR evaluation for tunneled HD catheter placement  Significant Hospital Events:  12/8 admit 12/10 L thora with 450cc removed, transudate, neg cultures 12/11 echo 12/12 HD cath and art line removed 12/15 new femoral HD cath placed 12/17 transfer to [facility] 53M 12/19 HD  12/20 CRRT 12/23 Weaned off levophed  this am. HD trial this am. Transferred to [facility] 12/25 Returned to ICU for hypotension 12/28-remains on Levophed  12/29-remains on Levophed , tolerating dialysis 12/31 was off Levophed  this morning 1/2 on Levophed  and had HD 1/4 changed to neo and vaso, stopped levophed  1/5 HD 1/6 CRRT and GOC discussion w/ pt's daughter  1/14 CRRT discontinued. Plan to transition to comfort care   Resolved problem list  Septic shock Multifocal pneumonia -  s/p antibiotics (zosyn  x 4d and additional course of ceftriaxone  x 5 days) Acute hypoxemic respiratory failure, resolved   Assessment and Plan    GOC Palliative following. Discussion w/ pt and pt's daughter who is POA. PMT and CCM expressed concern to both how pt may not tolerate HD outpatient. See 1/6 IPAL note.  -Patient states will follow daughter's decision.  -He is aware that if he is unable to tolerate HD then he will die.  -Family at bedside. Discussed process to transition to comfort care. Addressed questions and concerns. Transitioning to comfort care 11/30/24. CRRT discontinued and vasopressor weaned off  Chronic hypotension requiring vasopressor support No evidence of worsening infection driving hypotension.  Not in heart failure.  Suspect related to poor cardiac function in setting of renal disease - Has been trialed on midodrine  and Strattera  which have now been discontinued in setting of comfort care - s/p CRRT with vasopressor dependence; now being transitioned off dialysis and vasopressor support to comfort care   MRSA bacteremia - s/p vanc; was to complete extended course to 1/23, but stopped in setting of comfort care - Unable to complete TEE at this time - 12/12 Bcx negative, 12/18 Bcx negative - 1/5 Bcx NGTD   Elevated LFTs Elevated Bili Slowly improving with stable bilirubin. Denies abd pain and NT on abd exam. Afebrile. UQ US  w/ single mobile gallstone.  - continue comfort care    Afib w/ RVR -  Currently rate controled - continue comfort care    Heart failure with reduced ejection fraction of 20 to 25% Severe aortic stenosis - Not on GDMT due to hypotension  - continue comfort care   End-stage renal disease on hemodialysis Hypervolemic with 3rd spacing - continue comfort care  - No further dialysis   Type 2 diabetes CBG goal 140-180. Poor appetite so insulin  d/c.  - continue comfort care    Peripheral vascular disease - Aspirin  discontinued in  setting of comfort care   Depressed mood Poor appetite - continue comfort care

## 2024-10-26 ENCOUNTER — Encounter (HOSPITAL_COMMUNITY): Payer: Self-pay | Admitting: Internal Medicine

## 2024-10-26 ENCOUNTER — Inpatient Hospital Stay (HOSPITAL_COMMUNITY)

## 2024-10-26 DIAGNOSIS — N186 End stage renal disease: Secondary | ICD-10-CM

## 2024-10-26 DIAGNOSIS — Z89511 Acquired absence of right leg below knee: Secondary | ICD-10-CM

## 2024-10-26 DIAGNOSIS — J90 Pleural effusion, not elsewhere classified: Secondary | ICD-10-CM

## 2024-10-26 DIAGNOSIS — A4102 Sepsis due to Methicillin resistant Staphylococcus aureus: Secondary | ICD-10-CM

## 2024-10-26 LAB — BODY FLUID CELL COUNT WITH DIFFERENTIAL
Lymphs, Fluid: 16 %
Monocyte-Macrophage-Serous Fluid: 20 % — ABNORMAL LOW (ref 50–90)
Neutrophil Count, Fluid: 64 % — ABNORMAL HIGH (ref 0–25)
Total Nucleated Cell Count, Fluid: 724 uL (ref 0–1000)

## 2024-10-26 LAB — GRAM STAIN

## 2024-10-26 LAB — GLUCOSE, CAPILLARY
Glucose-Capillary: 219 mg/dL — ABNORMAL HIGH (ref 70–99)
Glucose-Capillary: 277 mg/dL — ABNORMAL HIGH (ref 70–99)
Glucose-Capillary: 199 mg/dL — ABNORMAL HIGH (ref 70–99)
Glucose-Capillary: 228 mg/dL — ABNORMAL HIGH (ref 70–99)

## 2024-10-26 LAB — PROTEIN, PLEURAL OR PERITONEAL FLUID: Total protein, fluid: <3 g/dL

## 2024-10-26 LAB — GLUCOSE, PLEURAL OR PERITONEAL FLUID: Glucose, Fluid: 209 mg/dL

## 2024-10-26 LAB — LACTATE DEHYDROGENASE, PLEURAL OR PERITONEAL FLUID: LD, Fluid: 55 U/L — ABNORMAL HIGH (ref 3–23)

## 2024-10-26 MED ORDER — LIDOCAINE HCL (PF) 2 % IJ SOLN
10.0000 mL | Freq: Once | INTRAMUSCULAR | Status: AC
  Administered 2024-10-26: 10 mL

## 2024-10-26 MED ORDER — LIDOCAINE HCL (PF) 1 % IJ SOLN
INTRAMUSCULAR | Status: AC
  Filled 2024-10-26: qty 30

## 2024-10-26 MED ORDER — LIDOCAINE HCL (PF) 2 % IJ SOLN
INTRAMUSCULAR | Status: AC
  Filled 2024-10-26: qty 10

## 2024-10-26 NOTE — Progress Notes (Signed)
 Patient meds brought to hospital by family. Sent to pharmacy.

## 2024-10-26 NOTE — Progress Notes (Addendum)
 [name] [name] is an 63 y.o. male HTN, T2DM, S/P rt BKA, nonobstructive coronary artery disease, HFrEF, obesity and end-stage renal disease on hemodialysis admitted with concerns of increasing difficulty with SOB for 2 to 3 weeks with recently worsening chronic cough.  He recently was started on doxycycline  on 11/30 for possible LRTI and then on ciprofloxacin  on 12/3 for urinary tract infection. Subjective fevers at home with associated chills, CT scan of the chest showed multifocal bronchopneumonia with small right and moderate loculated left pleural effusion with some atelectasis.  He was started on CAP coverage with vancomycin  and cefepime .  Increasingly hypotensive prompting need to initiate Levophed .  He also has positive blood cultures (4 out of 4) for gram-positive cocci which is very concerning in the setting of an indwelling left IJ TDC.  Dialysis prescription: [facility], MWF, 180 dialyzer, 4 hours 15 minutes, BFR 400/DFR 800, EDW 1 1.9 kg, 2K/2.0 calcium , UF profile #4, heparin  6000 unit bolus.  Left IJ TDC.  Hectorol  5 mcg IV q. HD.   On Sensipar  60 mg daily, midodrine  20 mg 3 times daily with option to take extra 20 mg mid to dialysis, Renvela  4000 mg 3 times daily AC.  Assessment/Plan: 1.  Multifocal pneumonia: Negative testing for RSV infection and COVID and started on presumptive treatment for pneumonia based on CT scan findings; on vancomycin  and cefepime  -> Vanc.  Concern raised with 4 out of 4 positive blood cultures with gram-positive cocci.  Awaiting identification/sensitivities; on Vanc, ID onboard as well. 2.  End-stage renal disease: Typically on MWF dialysis schedule and dialysis postponed to Tues (1L net UF). Attempting to wean down Levophed   (still on ) and restarted his typical dose of midodrine  20 mg 3 times daily.  He does not appear to be hypervolemic on exam or upon review of his imaging.    Per ID recs needs at least 48hrs line free, currently still  has IV's on right, art line, LIJ TC. Appreciate Dr. Evonnie agreeing to remove the LIJ TC today, plan on temp on Fri, HD, then remove temp with TC Monday. WBC elevated as well.  3.  Hypotension: Acute on chronic with evidence of sepsis in the setting of infection/bacteremia.  Resume oral midodrine  dose 20 mg 3 times a day and weaning down on Levophed  drip (still on 4mcg). 4.  Anemia: Hemoglobin and hematocrit currently at acceptable range, no indication for ESA. 5.  Secondary hyperparathyroidism: Calcium  level acceptable with significant hyperphosphatemia-resume sevelamer  for phosphorus binding and Sensipar  for PTH control.  Hold Hectorol  for now.  Subjective: Denies cramping with HD yesterday; also denies fever, chills, nausea, myalgias. Tolerated HD Tuesday.   Chemistry and CBC: Creatinine, Ser  Date/Time Value Ref Range Status  10/25/2024 04:42 AM 11.20 (H) 0.61 - 1.24 mg/dL Final  87/91/7974 88:87 AM 10.50 (H) 0.61 - 1.24 mg/dL Final  94/85/7974 92:42 AM 10.60 (H) 0.61 - 1.24 mg/dL Final  95/97/7974 95:69 AM 9.18 (H) 0.61 - 1.24 mg/dL Final  96/68/7974 91:94 AM 10.56 (H) 0.61 - 1.24 mg/dL Final  96/70/7974 93:43 AM 11.53 (H) 0.61 - 1.24 mg/dL Final  96/71/7974 93:73 AM 9.60 (H) 0.61 - 1.24 mg/dL Final  96/72/7974 97:63 PM 9.30 (H) 0.61 - 1.24 mg/dL Final  96/72/7974 97:72 PM 8.70 (H) 0.61 - 1.24 mg/dL Final  97/71/7974 94:70 PM 5.71 (H) 0.61 - 1.24 mg/dL Final  97/73/7974 95:44 AM 8.83 (H) 0.61 - 1.24 mg/dL Final  97/74/7974 87:49 PM 7.76 (H) 0.61 - 1.24  mg/dL Final  98/72/7974 92:60 AM 10.57 (H) 0.61 - 1.24 mg/dL Final  98/73/7974 87:89 PM 9.03 (H) 0.61 - 1.24 mg/dL Final  98/75/7974 97:43 AM 8.80 (H) 0.61 - 1.24 mg/dL Final  98/76/7974 96:98 AM 7.02 (H) 0.61 - 1.24 mg/dL Final  98/77/7974 97:46 AM 9.60 (H) 0.61 - 1.24 mg/dL Final  98/78/7974 97:47 AM 8.06 (H) 0.61 - 1.24 mg/dL Final  98/79/7974 97:48 AM 11.71 (H) 0.61 - 1.24 mg/dL Final  98/80/7974 97:75 AM 10.01 (H) 0.61 -  1.24 mg/dL Final  98/81/7974 97:53 AM 8.06 (H) 0.61 - 1.24 mg/dL Final  98/82/7974 96:95 AM 9.49 (H) 0.61 - 1.24 mg/dL Final  98/83/7974 97:53 AM 7.81 (H) 0.61 - 1.24 mg/dL Final  98/84/7974 88:55 AM 11.57 (H) 0.61 - 1.24 mg/dL Final  98/86/7974 97:50 AM 11.83 (H) 0.61 - 1.24 mg/dL Final  98/91/7974 97:42 AM 11.32 (H) 0.61 - 1.24 mg/dL Final  98/93/7974 91:59 AM 13.17 (H) 0.61 - 1.24 mg/dL Final  98/94/7974 89:55 AM 11.72 (H) 0.61 - 1.24 mg/dL Final  98/96/7974 91:85 AM 13.22 (H) 0.61 - 1.24 mg/dL Final  98/98/7974 96:84 PM 14.18 (H) 0.61 - 1.24 mg/dL Final  87/68/7975 97:54 AM 11.15 (H) 0.61 - 1.24 mg/dL Final  87/69/7975 95:97 AM 9.70 (H) 0.61 - 1.24 mg/dL Final  87/70/7975 96:83 AM 7.77 (H) 0.61 - 1.24 mg/dL Final  87/71/7975 96:67 AM 9.31 (H) 0.61 - 1.24 mg/dL Final  87/72/7975 91:83 AM 7.21 (H) 0.61 - 1.24 mg/dL Final  87/73/7975 91:49 AM 8.39 (H) 0.61 - 1.24 mg/dL Final  87/74/7975 95:52 PM 11.53 (H) 0.61 - 1.24 mg/dL Final  87/74/7975 95:92 AM 10.09 (H) 0.61 - 1.24 mg/dL Final  87/75/7975 96:96 PM 8.80 (H) 0.61 - 1.24 mg/dL Final  87/75/7975 96:40 AM 11.47 (H) 0.61 - 1.24 mg/dL Final  87/76/7975 92:54 PM 10.49 (H) 0.61 - 1.24 mg/dL Final  87/77/7975 95:41 PM 7.50 (H) 0.61 - 1.24 mg/dL Final  87/89/7975 97:91 AM 8.37 (H) 0.61 - 1.24 mg/dL Final  87/90/7975 97:85 AM 10.98 (H) 0.61 - 1.24 mg/dL Final  87/91/7975 96:99 PM 10.15 (H) 0.61 - 1.24 mg/dL Final  87/92/7975 95:44 PM 7.99 (H) 0.61 - 1.24 mg/dL Final  87/92/7975 93:91 AM 7.95 (H) 0.61 - 1.24 mg/dL Final  87/92/7975 97:84 AM 7.87 (H) 0.61 - 1.24 mg/dL Final  87/92/7975 98:64 AM 8.10 (H) 0.61 - 1.24 mg/dL Final  87/93/7975 94:57 PM 6.75 (H) 0.61 - 1.24 mg/dL Final  87/93/7975 87:90 AM 10.30 (H) 0.61 - 1.24 mg/dL Final  87/76/7976 91:73 AM 9.74 (H) 0.61 - 1.24 mg/dL Final   Recent Labs  Lab 10/24/24 1112 10/25/24 0442  NA 130* 130*  K 3.7 4.1  CL 87* 88*  CO2 22 19*  GLUCOSE 105* 215*  BUN 51* 63*  CREATININE  10.50* 11.20*  CALCIUM  9.6 9.2  PHOS  --  8.0*   Recent Labs  Lab 10/24/24 1014 10/25/24 0442  WBC 10.9* 16.1*  NEUTROABS 8.0*  --   HGB 14.0 12.6*  HCT 43.1 37.8*  MCV 75.3* 74.6*  PLT 146* 185   Liver Function Tests: Recent Labs  Lab 10/24/24 1457 10/25/24 0442  AST 23  --   ALT 10  --   ALKPHOS 118  --   BILITOT 2.4*  --   PROT 7.8  --   ALBUMIN  3.0* 3.0*   No results for input(s): LIPASE, AMYLASE in the last 168 hours. No results for input(s): AMMONIA in the last 168 hours. Cardiac Enzymes:  No results for input(s): CKTOTAL, CKMB, CKMBINDEX, TROPONINI in the last 168 hours. Iron  Studies: No results for input(s): IRON , TIBC, TRANSFERRIN, FERRITIN in the last 72 hours. PT/INR: @LABRCNTIP (inr:5)  Xrays/Other Studies: ) Results for orders placed or performed during the hospital encounter of 10/24/24 (from the past 48 hours)  CBC with Differential     Status: Abnormal   Collection Time: 10/24/24 10:14 AM  Result Value Ref Range   WBC 10.9 (H) 4.0 - 10.5 K/uL   RBC 5.72 4.22 - 5.81 MIL/uL   Hemoglobin 14.0 13.0 - 17.0 g/dL   HCT 56.8 60.9 - 47.9 %   MCV 75.3 (L) 80.0 - 100.0 fL   MCH 24.5 (L) 26.0 - 34.0 pg   MCHC 32.5 30.0 - 36.0 g/dL   RDW 73.2 (H) 88.4 - 84.4 %   Platelets 146 (L) 150 - 400 K/uL    Comment: SPECIMEN CHECKED FOR CLOTS REPEATED TO VERIFY PLATELET COUNT CONFIRMED BY SMEAR    nRBC 0.2 0.0 - 0.2 %   Neutrophils Relative % 73 %   Neutro Abs 8.0 (H) 1.7 - 7.7 K/uL   Lymphocytes Relative 8 %   Lymphs Abs 0.9 0.7 - 4.0 K/uL   Monocytes Relative 14 %   Monocytes Absolute 1.6 (H) 0.1 - 1.0 K/uL   Eosinophils Relative 3 %   Eosinophils Absolute 0.4 0.0 - 0.5 K/uL   Basophils Relative 1 %   Basophils Absolute 0.1 0.0 - 0.1 K/uL   Smear Review See Note     Comment: PLATELET COUNT CONFIRMED BY SMEAR   Immature Granulocytes 1 %   Abs Immature Granulocytes 0.06 0.00 - 0.07 K/uL   Reactive, Benign Lymphocytes PRESENT    Tear  Drop Cells PRESENT    Polychromasia PRESENT    Target Cells PRESENT    Ovalocytes PRESENT     Comment: Performed at [facility], [address] [place], [place] [address]  Hemoglobin A1c     Status: Abnormal   Collection Time: 10/24/24 10:14 AM  Result Value Ref Range   Hgb A1c MFr Bld 8.6 (H) 4.8 - 5.6 %    Comment: (NOTE) Diagnosis of Diabetes The following HbA1c ranges recommended by the American Diabetes Association (ADA) may be used as an aid in the diagnosis of diabetes mellitus.  Hemoglobin             Suggested A1C NGSP%              Diagnosis  <5.7                   Non Diabetic  5.7-6.4                Pre-Diabetic  >6.4                   Diabetic  <7.0                   Glycemic control for                       adults with diabetes.     Mean Plasma Glucose 200.12 mg/dL    Comment: Performed at [facility], [address], [place], [place] [address]  Resp panel by RT-PCR (RSV, Flu A&B, Covid) Anterior Nasal Swab     Status: None   Collection Time: 10/24/24 10:20 AM   Specimen: Anterior Nasal Swab  Result Value Ref Range   SARS  Coronavirus 2 by RT PCR NEGATIVE NEGATIVE    Comment: (NOTE) SARS-CoV-2 target nucleic acids are NOT DETECTED.  The SARS-CoV-2 RNA is generally detectable in upper respiratory specimens during the acute phase of infection. The lowest concentration of SARS-CoV-2 viral copies this assay can detect is 138 copies/mL. A negative result does not preclude SARS-Cov-2 infection and should not be used as the sole basis for treatment or other patient management decisions. A negative result may occur with  improper specimen collection/handling, submission of specimen other than nasopharyngeal swab, presence of viral mutation(s) within the areas targeted by this assay, and inadequate number of viral copies(<138 copies/mL). A negative result must be combined with clinical observations, patient history, and epidemiological information. The  expected result is Negative.  Fact Sheet for Patients:  bloggercourse.com  Fact Sheet for Healthcare Providers:  seriousbroker.it  This test is no t yet approved or cleared by the [place]  FDA and  has been authorized for detection and/or diagnosis of SARS-CoV-2 by FDA under an Emergency Use Authorization (EUA). This EUA will remain  in effect (meaning this test can be used) for the duration of the COVID-19 declaration under Section 564(b)(1) of the Act, 21 U.S.C.section 360bbb-3(b)(1), unless the authorization is terminated  or revoked sooner.       Influenza A by PCR NEGATIVE NEGATIVE   Influenza B by PCR NEGATIVE NEGATIVE    Comment: (NOTE) The Xpert Xpress SARS-CoV-2/FLU/RSV plus assay is intended as an aid in the diagnosis of influenza from Nasopharyngeal swab specimens and should not be used as a sole basis for treatment. Nasal washings and aspirates are unacceptable for Xpert Xpress SARS-CoV-2/FLU/RSV testing.  Fact Sheet for Patients: bloggercourse.com  Fact Sheet for Healthcare Providers: seriousbroker.it  This test is not yet approved or cleared by the [place]  FDA and has been authorized for detection and/or diagnosis of SARS-CoV-2 by FDA under an Emergency Use Authorization (EUA). This EUA will remain in effect (meaning this test can be used) for the duration of the COVID-19 declaration under Section 564(b)(1) of the Act, 21 U.S.C. section 360bbb-3(b)(1), unless the authorization is terminated or revoked.     Resp Syncytial Virus by PCR NEGATIVE NEGATIVE    Comment: (NOTE) Fact Sheet for Patients: bloggercourse.com  Fact Sheet for Healthcare Providers: seriousbroker.it  This test is not yet approved or cleared by the [place]  FDA and has been authorized for detection and/or diagnosis of  SARS-CoV-2 by FDA under an Emergency Use Authorization (EUA). This EUA will remain in effect (meaning this test can be used) for the duration of the COVID-19 declaration under Section 564(b)(1) of the Act, 21 U.S.C. section 360bbb-3(b)(1), unless the authorization is terminated or revoked.  Performed at [facility], [address], [place], [place] [address]   Basic metabolic panel with GFR     Status: Abnormal   Collection Time: 10/24/24 11:12 AM  Result Value Ref Range   Sodium 130 (L) 135 - 145 mmol/L    Comment: Electrolytes repeated to verify    Potassium 3.7 3.5 - 5.1 mmol/L   Chloride 87 (L) 98 - 111 mmol/L   CO2 22 22 - 32 mmol/L   Glucose, Bld 105 (H) 70 - 99 mg/dL    Comment: Glucose reference range applies only to samples taken after fasting for at least 8 hours.   BUN 51 (H) 8 - 23 mg/dL   Creatinine, Ser 89.49 (H) 0.61 - 1.24 mg/dL   Calcium  9.6 8.9 - 10.3 mg/dL   GFR,  Estimated 5 (L) >60 mL/min    Comment: (NOTE) Calculated using the CKD-EPI Creatinine Equation (2021)    Anion gap 21 (H) 5 - 15    Comment: Performed at [facility], [address] [place], [place] [address]  Troponin T, High Sensitivity     Status: Abnormal   Collection Time: 10/24/24 11:12 AM  Result Value Ref Range   Troponin T High Sensitivity 225 (HH) 0 - 19 ng/L    Comment: Critical Value, Read Back and verified with T LENARD AT 1211 ON [id number] BY S DALTON (NOTE) Biotin concentrations > 1000 ng/mL falsely decrease TnT results.  Serial cardiac troponin measurements are suggested.  Refer to the Links section for chest pain algorithms and additional  guidance. Performed at [facility], [address], [place], [place] [address]   Troponin T, High Sensitivity     Status: Abnormal   Collection Time: 10/24/24 12:15 PM  Result Value Ref Range   Troponin T High Sensitivity 216 (HH) 0 - 19 ng/L    Comment: Critical value noted. Value is consistent with previously reported and called value   (NOTE) Biotin concentrations > 1000 ng/mL falsely decrease TnT results.  Serial cardiac troponin measurements are suggested.  Refer to the Links section for chest pain algorithms and additional  guidance. Performed at [facility], [address] [place], [place] [address]   Culture, blood (routine x 2)     Status: None (Preliminary result)   Collection Time: 10/24/24  2:07 PM   Specimen: BLOOD RIGHT FOREARM  Result Value Ref Range   Specimen Description      BLOOD RIGHT FOREARM BOTTLES DRAWN AEROBIC AND ANAEROBIC   Special Requests      Blood Culture results may not be optimal due to an inadequate volume of blood received in culture bottles   Culture  Setup Time      IN BOTH AEROBIC AND ANAEROBIC BOTTLES GRAM POSITIVE COCCI IN CLUSTERS Gram Stain Report Called to,Read Back By and Verified With: K. [facility] RN 10/25/24 @0709  BY J. WHITE Performed at [facility], [address], [place], [place] [address]    Culture GRAM POSITIVE COCCI    Report Status PENDING   Lactic acid, plasma     Status: Abnormal   Collection Time: 10/24/24  2:08 PM  Result Value Ref Range   Lactic Acid, Venous 2.1 (HH) 0.5 - 1.9 mmol/L    Comment: Critical Value, Read Back and verified with T LENARD AT 1442 ON [id number] BY S DALTON Performed at [facility], [address], [place], [place] [address]   Culture, blood (routine x 2)     Status: None (Preliminary result)   Collection Time: 10/24/24  2:51 PM   Specimen: BLOOD  Result Value Ref Range   Specimen Description      BLOOD BLOOD RIGHT ARM Performed at [facility], [address], [place], [place] [address]    Special Requests      BOTTLES DRAWN AEROBIC AND ANAEROBIC Blood Culture adequate volume Performed at [facility], [address] [place], [place] [address]    Culture  Setup Time      IN BOTH AEROBIC AND ANAEROBIC BOTTLES GRAM POSITIVE COCCI IN CLUSTERS Gram Stain Report Called to,Read Back By and Verified With: K. [facility] RN 10/25/24  @0709  BY J. WHITE CRITICAL RESULT CALLED TO, READ BACK BY AND VERIFIED WITH: PHARMD LAURIE POOLE 10/25/24 @1034  PR Performed at [facility], [address], [place], [place] [address]  Culture GRAM POSITIVE COCCI    Report Status PENDING   Blood Culture ID Panel (Reflexed)     Status: Abnormal   Collection Time: 10/24/24  2:51 PM  Result Value Ref Range   Enterococcus faecalis NOT DETECTED NOT DETECTED   Enterococcus Faecium NOT DETECTED NOT DETECTED   Listeria monocytogenes NOT DETECTED NOT DETECTED   Staphylococcus species DETECTED (A) NOT DETECTED    Comment: CRITICAL RESULT CALLED TO, READ BACK BY AND VERIFIED WITH: PHARMD LAURIE POOLE 10/25/24 @1034  PR    Staphylococcus aureus (BCID) DETECTED (A) NOT DETECTED    Comment: Methicillin (oxacillin)-resistant Staphylococcus aureus (MRSA). MRSA is predictably resistant to beta-lactam antibiotics (except ceftaroline). Preferred therapy is vancomycin  unless clinically contraindicated. Patient requires contact precautions if  hospitalized. CRITICAL RESULT CALLED TO, READ BACK BY AND VERIFIED WITH: PHARMD LAURIE POOLE 10/25/24 @1034  PR    Staphylococcus epidermidis NOT DETECTED NOT DETECTED   Staphylococcus lugdunensis NOT DETECTED NOT DETECTED   Streptococcus species NOT DETECTED NOT DETECTED   Streptococcus agalactiae NOT DETECTED NOT DETECTED   Streptococcus pneumoniae NOT DETECTED NOT DETECTED   Streptococcus pyogenes NOT DETECTED NOT DETECTED   A.calcoaceticus-baumannii NOT DETECTED NOT DETECTED   Bacteroides fragilis NOT DETECTED NOT DETECTED   Enterobacterales NOT DETECTED NOT DETECTED   Enterobacter cloacae complex NOT DETECTED NOT DETECTED   Escherichia coli NOT DETECTED NOT DETECTED   Klebsiella aerogenes NOT DETECTED NOT DETECTED   Klebsiella oxytoca NOT DETECTED NOT DETECTED   Klebsiella pneumoniae NOT DETECTED NOT DETECTED   Proteus species NOT DETECTED NOT DETECTED   Salmonella species NOT DETECTED NOT DETECTED    Serratia marcescens NOT DETECTED NOT DETECTED   Haemophilus influenzae NOT DETECTED NOT DETECTED   Neisseria meningitidis NOT DETECTED NOT DETECTED   Pseudomonas aeruginosa NOT DETECTED NOT DETECTED   Stenotrophomonas maltophilia NOT DETECTED NOT DETECTED   Candida albicans NOT DETECTED NOT DETECTED   Candida auris NOT DETECTED NOT DETECTED   Candida glabrata NOT DETECTED NOT DETECTED   Candida krusei NOT DETECTED NOT DETECTED   Candida parapsilosis NOT DETECTED NOT DETECTED   Candida tropicalis NOT DETECTED NOT DETECTED   Cryptococcus neoformans/gattii NOT DETECTED NOT DETECTED   Meth resistant mecA/C and MREJ DETECTED (A) NOT DETECTED    Comment: CRITICAL RESULT CALLED TO, READ BACK BY AND VERIFIED WITH: PHARMD LAURIE POOLE 10/25/24 @1034  PR Performed at [facility], [address] [place], [place] [address]   Hepatic function panel     Status: Abnormal   Collection Time: 10/24/24  2:57 PM  Result Value Ref Range   Total Protein 7.8 6.5 - 8.1 g/dL   Albumin  3.0 (L) 3.5 - 5.0 g/dL   AST 23 15 - 41 U/L   ALT 10 0 - 44 U/L   Alkaline Phosphatase 118 38 - 126 U/L   Total Bilirubin 2.4 (H) 0.0 - 1.2 mg/dL   Bilirubin, Direct 1.7 (H) 0.0 - 0.2 mg/dL   Indirect Bilirubin 0.7 0.3 - 0.9 mg/dL    Comment: Performed at [facility], [address], [place], [place] [address]  Lactic acid, plasma     Status: Abnormal   Collection Time: 10/24/24  4:08 PM  Result Value Ref Range   Lactic Acid, Venous 2.7 (HH) 0.5 - 1.9 mmol/L    Comment: CRITICAL VALUE NOTED.  VALUE IS CONSISTENT WITH PREVIOUSLY REPORTED AND CALLED VALUE. Performed at [facility], [address] [place], [place] [address]   Hepatitis B surface antigen     Status: None  Collection Time: 10/24/24  4:08 PM  Result Value Ref Range   Hepatitis B Surface Ag NON REACTIVE NON REACTIVE    Comment: Performed at [facility], [address], [place], [place] [address]  Hepatitis B surface antibody,quantitative      Status: None   Collection Time: 10/24/24  4:08 PM  Result Value Ref Range   Hep B S AB Quant (Post) 167.0 Immunity>10 mIU/mL    Comment: (NOTE)  Status of Immunity                     Anti-HBs Level  ------------------                     -------------- Inconsistent with Immunity                  0.0 - 10.0 Consistent with Immunity                         >10.0 Performed At: [facility] [address] [place], [place] [id number] Jennette Shorter MD Ey:[phone]   MRSA Next Gen by PCR, Nasal     Status: Abnormal   Collection Time: 10/24/24  4:33 PM   Specimen: Nasal Mucosa; Nasal Swab  Result Value Ref Range   MRSA by PCR Next Gen DETECTED (A) NOT DETECTED    Comment: RESULT CALLED TO, READ BACK BY AND VERIFIED WITH: C KINSLEY,RN@1950  1208/25 MK (NOTE) The GeneXpert MRSA Assay (FDA approved for NASAL specimens only), is one component of a comprehensive MRSA colonization surveillance program. It is not intended to diagnose MRSA infection nor to guide or monitor treatment for MRSA infections. Test performance is not FDA approved in patients less than 63 years old. Performed at [facility], [address] [place], [place] [address]   Glucose, capillary     Status: Abnormal   Collection Time: 10/24/24  9:01 PM  Result Value Ref Range   Glucose-Capillary 168 (H) 70 - 99 mg/dL    Comment: Glucose reference range applies only to samples taken after fasting for at least 8 hours.   Comment 1 Notify RN    Comment 2 Document in Chart   CBC     Status: Abnormal   Collection Time: 10/25/24  4:42 AM  Result Value Ref Range   WBC 16.1 (H) 4.0 - 10.5 K/uL   RBC 5.07 4.22 - 5.81 MIL/uL   Hemoglobin 12.6 (L) 13.0 - 17.0 g/dL   HCT 62.1 (L) 60.9 - 47.9 %   MCV 74.6 (L) 80.0 - 100.0 fL   MCH 24.9 (L) 26.0 - 34.0 pg   MCHC 33.3 30.0 - 36.0 g/dL   RDW 74.1 (H) 88.4 - 84.4 %   Platelets 185 150 - 400 K/uL   nRBC 0.0 0.0 - 0.2 %    Comment: Performed at [facility], [address] [place], [place] [address]  Renal function panel     Status: Abnormal   Collection Time: 10/25/24  4:42 AM  Result Value Ref Range   Sodium 130 (L) 135 - 145 mmol/L    Comment: Electrolytes repeated to verify    Potassium 4.1 3.5 - 5.1 mmol/L   Chloride 88 (L) 98 - 111 mmol/L   CO2 19 (L) 22 - 32 mmol/L   Glucose, Bld 215 (H) 70 - 99 mg/dL    Comment: Glucose reference range applies only to samples taken after fasting for at  least 8 hours.   BUN 63 (H) 8 - 23 mg/dL   Creatinine, Ser 88.79 (H) 0.61 - 1.24 mg/dL   Calcium  9.2 8.9 - 10.3 mg/dL   Phosphorus 8.0 (H) 2.5 - 4.6 mg/dL   Albumin  3.0 (L) 3.5 - 5.0 g/dL   GFR, Estimated 5 (L) >60 mL/min    Comment: (NOTE) Calculated using the CKD-EPI Creatinine Equation (2021)    Anion gap 23 (H) 5 - 15    Comment: Performed at [facility], [address] [place], [place] [address]  Lactic acid, plasma     Status: Abnormal   Collection Time: 10/25/24  4:42 AM  Result Value Ref Range   Lactic Acid, Venous 2.5 (HH) 0.5 - 1.9 mmol/L    Comment: Critical Value, Read Back and verified with KYM Linger [phone], Viray,J Performed at [facility], [address] [place], [place] [address]   Glucose, capillary     Status: Abnormal   Collection Time: 10/25/24  7:29 AM  Result Value Ref Range   Glucose-Capillary 223 (H) 70 - 99 mg/dL    Comment: Glucose reference range applies only to samples taken after fasting for at least 8 hours.  Glucose, capillary     Status: Abnormal   Collection Time: 10/25/24 11:06 AM  Result Value Ref Range   Glucose-Capillary 164 (H) 70 - 99 mg/dL    Comment: Glucose reference range applies only to samples taken after fasting for at least 8 hours.  Glucose, capillary     Status: Abnormal   Collection Time: 10/25/24  4:20 PM  Result Value Ref Range   Glucose-Capillary 148 (H) 70 - 99 mg/dL    Comment: Glucose reference range applies only to samples taken after fasting for at least 8 hours.  Glucose, capillary      Status: Abnormal   Collection Time: 10/25/24 11:19 PM  Result Value Ref Range   Glucose-Capillary 247 (H) 70 - 99 mg/dL    Comment: Glucose reference range applies only to samples taken after fasting for at least 8 hours.  Glucose, capillary     Status: Abnormal   Collection Time: 10/26/24  3:07 AM  Result Value Ref Range   Glucose-Capillary 219 (H) 70 - 99 mg/dL    Comment: Glucose reference range applies only to samples taken after fasting for at least 8 hours.  Glucose, capillary     Status: Abnormal   Collection Time: 10/26/24  7:50 AM  Result Value Ref Range   Glucose-Capillary 277 (H) 70 - 99 mg/dL    Comment: Glucose reference range applies only to samples taken after fasting for at least 8 hours.   CT Chest Wo Contrast Result Date: 10/24/2024 CLINICAL DATA:  Pneumonia. Shortness of breath after dialysis Friday. EXAM: CT CHEST WITHOUT CONTRAST TECHNIQUE: Multidetector CT imaging of the chest was performed following the standard protocol without IV contrast. RADIATION DOSE REDUCTION: This exam was performed according to the departmental dose-optimization program which includes automated exposure control, adjustment of the mA and/or kV according to patient size and/or use of iterative reconstruction technique. COMPARISON:  11/08/2023. FINDINGS: Cardiovascular: Atherosclerotic calcification of the aorta, aortic valve and coronary arteries. Enlarged pulmonic trunk and heart. No pericardial effusion. Left IJ dialysis catheter terminates in the right atrium. Mediastinum/Nodes: No pathologically enlarged mediastinal or axillary lymph nodes. Hilar regions are difficult to definitively evaluate without IV contrast. Esophagus is grossly unremarkable. Lungs/Pleura: Image quality is degraded by expiratory phase imaging and respiratory motion. Peribronchovascular ground-glass and consolidation in  the anterior segment right upper lobe and likely elsewhere within the right middle and right lower lobes. Tiny  right and moderate left pleural effusion, the latter of which is partially loculated. Compressive collapse/consolidation in the left lower lobe and to a lesser extent, lingula. There may be tracheobronchomalacia. Upper Abdomen: Liver margin is slightly irregular. Bilateral renal stones and/or renal vascular calcifications. Visualized portions of the liver, gallbladder, adrenal glands, kidneys, spleen, pancreas, stomach and bowel are otherwise grossly unremarkable. No upper abdominal adenopathy. Slight nonspecific haziness in the left upper quadrant omentum. Musculoskeletal: Degenerative changes in the spine. Old sternal fracture. IMPRESSION: 1. Multilobar bronchopneumonia. 2. Tiny right and moderate partially loculated left pleural effusions. Collapse/consolidation in the left lower lobe may be due to atelectasis. Difficult to exclude pneumonia. 3. 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 4. Liver margin is slightly irregular, raising suspicion for cirrhosis. 5. Bilateral renal stones and/or renal vascular calcifications. 6. Aortic atherosclerosis (ICD10-I70.0). Coronary artery calcification. 7. Enlarged pulmonic trunk, indicative of pulmonary arterial hypertension. Electronically Signed   By: Newell Eke M.D.   On: 10/24/2024 14:15   DG Chest Port 1 View Result Date: 10/24/2024 CLINICAL DATA:  Shortness of breath. EXAM: PORTABLE CHEST 1 VIEW COMPARISON:  10/16/2024 FINDINGS: Low volume film. The cardio pericardial silhouette is enlarged. Retrocardiac collapse/consolidation with effusion is similar to prior. No pulmonary edema. Left IJ central line tip overlies the mid SVC level. IMPRESSION: Low volume film with persistent retrocardiac collapse/consolidation and effusion. Electronically Signed   By: Camellia Candle M.D.   On: 10/24/2024 11:31    PMH:   Past Medical History:  Diagnosis Date   ESRD on hemodialysis (HCC)    a. HD MWF > 10 years.   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 01/2022  Echo: EF 45-50%, glob HK, mod LVH, mildly reduced RV fxn, mild MR, mod-sev MV Ca2+. Mild AS; b. 10/2023 Echo: EF 45-50%, glob HK, GrII DD, mildly reduced RV fxn, RVSP 52.72mmHg. Mild MR/MS, mod TR.   Hypertension    Nonischemic cardiomyopathy (HCC)    a. 2019 Cath [facility]): Nonobstructive disease; b. 12/2019 Echo: EF 50%; c. 12/2019 MV (Tx w/u [facility]): Ant infarct w/ peri-infarct ischemia. EF 35%; c. 05/2021 MV (Tx w/u [facility]): Cor Ca2+, EF 53%, motion artifact, equiv study, likely low risk.   Nonobstructive coronary artery disease    a.  2019 Cath ([place]): Left main normal, LAD 30 proximal, RCA 30 distal, EF 25%.   Obesity    Peripheral arterial disease    Peripheral vascular disease    Type 2 diabetes mellitus (HCC)    a. 10/2023 - admission w/ HHS.    PSH:   Past Surgical History:  Procedure Laterality Date   A/V FISTULAGRAM N/A 08/15/2019   Procedure: A/V FISTULAGRAM;  Surgeon: Sheree Penne Bruckner, MD;  Location: [facility] INVASIVE CV LAB;  Service: Cardiovascular;  Laterality: N/A;   A/V FISTULAGRAM N/A 01/21/2024   Procedure: A/V Fistulagram;  Surgeon: Melia [name] ORN, MD;  Location: [facility] INVASIVE CV LAB;  Service: Cardiovascular;  Laterality: N/A;   AMPUTATION Right 02/02/2022   Procedure: AMPUTATION 5th RAY;  Surgeon: Joya Stabs, MD;  Location: [facility] OR;  Service: Podiatry;  Laterality: Right;   AMPUTATION Right 05/23/2022   Procedure: RIGHT BELOW KNEE AMPUTATION;  Surgeon: Harden Jerona GAILS, MD;  Location: [facility] OR;  Service: Orthopedics;  Laterality: Right;   AMPUTATION Right 07/04/2022   Procedure: REVISION AMPUTATION BELOW KNEE;  Surgeon: Harden Jerona GAILS, MD;  Location: [facility]  OR;  Service: Orthopedics;  Laterality: Right;   APPLICATION OF WOUND VAC  05/23/2022   Procedure: APPLICATION OF WOUND VAC;  Surgeon: Harden Jerona GAILS, MD;  Location: [facility] OR;  Service: Orthopedics;;   AV FISTULA PLACEMENT     CARDIAC CATHETERIZATION  02/17/2018   LHC 02/17/18 ([name]): 30% pLAD, 30%  dRCA, LVEDP 25-30, LVEF 25%.     CATARACT EXTRACTION W/PHACO Right 03/29/2020   Procedure: CATARACT EXTRACTION PHACO AND INTRAOCULAR LENS PLACEMENT (IOC)  RIGHT DIABETIC;  Surgeon: Mittie Gaskin, MD;  Location: [facility] ORS;  Service: Ophthalmology;  Laterality: Right;  Lot # [id number] H US : 02:30.9 AP% 9.5% CDE: 14.38   EYE SURGERY Right    cataract removed   [name] N/A 12/02/2023   Procedure: [name];  Surgeon: Federico Rosario BROCKS, MD;  Location: [facility] ENDOSCOPY;  Service: Gastroenterology;  Laterality: N/A;   I & D EXTREMITY Right 02/02/2022   Procedure: IRRIGATION AND DEBRIDEMENT RIGHT FOOT;  Surgeon: Joya Stabs, MD;  Location: [facility] OR;  Service: Podiatry;  Laterality: Right;   INCISION AND DRAINAGE OF WOUND Right 02/08/2022   Procedure: IRRIGATION AND DEBRIDEMENT WOUND;  Surgeon: Burt Fus, DPM;  Location: [facility] OR;  Service: Podiatry;  Laterality: Right;  with removal of infected bone    INSERTION OF DIALYSIS CATHETER  08/13/2019   Procedure: Insertion Of Dialysis Catheter;  Surgeon: Oris Krystal FALCON, MD;  Location: [facility] OR;  Service: Vascular;;   INSERTION OF DIALYSIS CATHETER Left 07/19/2020   Procedure: INSERTION OF DIALYSIS CATHETER;  Surgeon: Sheree Penne Bruckner, MD;  Location: [facility] OR;  Service: Vascular;  Laterality: Left;   IR FLUORO GUIDE CV LINE LEFT  02/16/2024   IR LUMBAR PUNCTURE  11/20/2023   IR US  GUIDE BX ASP/DRAIN  11/07/2022   IR US  GUIDE VASC ACCESS LEFT  02/16/2024   IRRIGATION AND DEBRIDEMENT ABSCESS     PERIPHERAL VASCULAR BALLOON ANGIOPLASTY  08/15/2019   Procedure: PERIPHERAL VASCULAR BALLOON ANGIOPLASTY;  Surgeon: Sheree Penne Bruckner, MD;  Location: [facility] INVASIVE CV LAB;  Service: Cardiovascular;;   PERIPHERAL VASCULAR BALLOON ANGIOPLASTY Left 02/06/2022   Procedure: PERIPHERAL VASCULAR BALLOON ANGIOPLASTY;  Surgeon: Gretta Bruckner PARAS, MD;  Location: [facility] INVASIVE CV LAB;  Service: Cardiovascular;  Laterality: Left;   PERIPHERAL VASCULAR BALLOON  ANGIOPLASTY  01/21/2024   Procedure: PERIPHERAL VASCULAR BALLOON ANGIOPLASTY;  Surgeon: Melia [name] ORN, MD;  Location: [facility] INVASIVE CV LAB;  Service: Cardiovascular;;  Cephalic Arch   RADIOLOGY WITH ANESTHESIA N/A 11/20/2023   Procedure: MRI WITH ANESTHESIA;  Surgeon: Radiologist, Medication, MD;  Location: [facility] OR;  Service: Radiology;  Laterality: N/A;   RADIOLOGY WITH ANESTHESIA N/A 11/20/2023   Procedure: IR WITH ANESTHESIA;  Surgeon: Alona Corners, DO;  Location: [facility] OR;  Service: Anesthesiology;  Laterality: N/A;  PLEASE OBTAIN OPENING PRESSURE AND DRAIN AT LEAST 30CC'S OF CSF.   REVISON OF ARTERIOVENOUS FISTULA Left 07/19/2020   Procedure: REVISON/PLICATION OF ARTERIOVENOUS FISTULA LEFT;  Surgeon: Sheree Penne Bruckner, MD;  Location: [facility] OR;  Service: Vascular;  Laterality: Left;   THROMBECTOMY AND REVISION OF ARTERIOVENTOUS (AV) GORETEX  GRAFT Left 08/13/2019   Procedure: THROMBECTOMY OF LEFT ARM ARTERIOVENTOUS (AV) FISTULA;  Surgeon: Oris Krystal FALCON, MD;  Location: [facility] OR;  Service: Vascular;  Laterality: Left;   TOE AMPUTATION     UPPER EXTREMITY VENOGRAPHY Bilateral 03/30/2024   Procedure: UPPER EXTREMITY VENOGRAPHY;  Surgeon: Lanis Fonda BRAVO, MD;  Location: [facility] INVASIVE CV LAB;  Service: Cardiovascular;  Laterality: Bilateral;    Allergies:  Allergies  Allergen Reactions   Sulfa Antibiotics Diarrhea and Nausea And Vomiting    Medications:   Prior to Admission medications   Medication Sig Start Date End Date Taking? Authorizing Provider  acetaminophen  (TYLENOL ) 500 MG tablet Take 1,000 mg by mouth 2 (two) times daily as needed for moderate pain (pain score 4-6), headache or fever.   Yes [provider]  aspirin  EC 81 MG tablet Take 1 tablet (81 mg total) by mouth daily with breakfast. Swallow whole. 02/17/24  Yes Emokpae, Courage, MD  cinacalcet  (SENSIPAR ) 30 MG tablet Take 30 mg by mouth daily.   Yes [provider]  ciprofloxacin  (CIPRO ) 500 MG tablet Take 1 tablet (500 mg  total) by mouth 2 (two) times daily. One po bid x 7 days 10/18/24  Yes Delo, Vicenta, MD  doxycycline  (VIBRAMYCIN ) 100 MG capsule Take 1 capsule (100 mg total) by mouth 2 (two) times daily. One po bid x 7 days 10/16/24  Yes Zammit, Joseph, MD  gabapentin  (NEURONTIN ) 300 MG capsule Take 300 mg by mouth daily.   Yes [provider]  insulin  isophane & regular human KwikPen (NOVOLIN  70/30 KWIKPEN) (70-30) 100 UNIT/ML KwikPen Inject 30U in the morning and 25U in the evening Patient taking differently: Inject 25-30 Units into the skin See admin instructions. Inject 30 Units in the morning and 25 Units in the evening 10/27/23  Yes Ezenduka, Nkeiruka J, MD  midodrine  (PROAMATINE ) 10 MG tablet Take 10 mg by mouth 3 (three) times daily. 06/10/24  Yes [provider]  Insulin  Pen Needle 32G X 4 MM MISC Use with insulin  pen. 10/28/23   Donnamarie Lebron PARAS, MD  pantoprazole  (PROTONIX ) 40 MG tablet Take 1 tablet (40 mg total) by mouth 2 (two) times daily. Patient not taking: Reported on 10/24/2024 01/13/24 06/07/24  Verdene Purchase, MD    Discontinued Meds:   Medications Discontinued During This Encounter  Medication Reason   midodrine  (PROAMATINE ) 5 MG tablet Patient Preference   cinacalcet  (SENSIPAR ) 60 MG tablet Patient Preference   cinacalcet  (SENSIPAR ) 60 MG tablet Patient Preference   atorvastatin  (LIPITOR) 40 MG tablet Patient Preference   sevelamer  carbonate (RENVELA ) 800 MG tablet Patient Preference   midodrine  (PROAMATINE ) tablet 10 mg    cinacalcet  (SENSIPAR ) tablet 30 mg    ceFEPIme  (MAXIPIME ) 1 g in sodium chloride  0.9 % 100 mL IVPB    ceFEPIme  (MAXIPIME ) 2 g in sodium chloride  0.9 % 100 mL IVPB    vancomycin  variable dose per unstable renal function (pharmacist dosing)     Social History:  reports that he has never smoked. He has never used smokeless tobacco. He reports current alcohol use. He reports that he does not use drugs.  Family History:   Family History  Problem  Relation Age of Onset   Hypertension Mother     Blood pressure 95/60, pulse 96, temperature 97.7 F (36.5 C), temperature source Oral, resp. rate (!) 26, height 6' 1 (1.854 m), weight 104.4 kg, SpO2 93%. Physical Exam: GEN: NAD, A&Ox3, NCAT, pleasant HEENT: No conjunctival pallor, EOMI NECK: Supple, no thyromegaly LUNGS: decreased bl in the rt lower field but no rales, rhonchi or wheezing CV: tachy, No M/R/G ABD: SNDNT +BS  EXT: No lower extremity edema, rt BKA well healed w no open sores      Jahlen Bollman, [name] ORN, MD 10/26/2024, 9:19 AM

## 2024-10-26 NOTE — Progress Notes (Signed)
 Virtual Visit via Video Note  I connected with [name] [name] on @TODAY @ at  by a video enabled telemedicine application and verified that I am speaking with the correct person using two identifiers.  Location: Patient: [facility] 08 Provider: [facility]   I discussed the limitations of evaluation and management by telemedicine and the availability of in person appointments. The patient expressed understanding and agreed to proceed.    Subjective: No new complaints   Antibiotics:  Anti-infectives (From admission, onward)    Start     Dose/Rate Route Frequency Ordered Stop   10/25/24 1800  ceFEPIme  (MAXIPIME ) 1 g in sodium chloride  0.9 % 100 mL IVPB  Status:  Discontinued        1 g 200 mL/hr over 30 Minutes Intravenous Every 24 hours 10/24/24 1642 10/25/24 0906   10/25/24 1600  ceFEPIme  (MAXIPIME ) 2 g in sodium chloride  0.9 % 100 mL IVPB  Status:  Discontinued        2 g 200 mL/hr over 30 Minutes Intravenous Every T-Th-Sa (Hemodialysis) 10/25/24 0906 10/25/24 1323   10/25/24 1600  vancomycin  (VANCOCIN ) IVPB 1000 mg/200 mL premix        1,000 mg 200 mL/hr over 60 Minutes Intravenous Every T-Th-Sa (Hemodialysis) 10/25/24 0908     10/24/24 1800  ceFEPIme  (MAXIPIME ) 2 g in sodium chloride  0.9 % 100 mL IVPB        2 g 200 mL/hr over 30 Minutes Intravenous  Once 10/24/24 1638 10/24/24 1720   10/24/24 1730  vancomycin  (VANCOREADY) IVPB 2000 mg/400 mL        2,000 mg 200 mL/hr over 120 Minutes Intravenous  Once 10/24/24 1638 10/24/24 1926   10/24/24 1639  vancomycin  variable dose per unstable renal function (pharmacist dosing)  Status:  Discontinued         Does not apply See admin instructions 10/24/24 1639 10/26/24 0843   10/24/24 1430  cefTRIAXone  (ROCEPHIN ) 1 g in sodium chloride  0.9 % 100 mL IVPB        1 g 200 mL/hr over 30 Minutes Intravenous  Once 10/24/24 1419 10/24/24 1536   10/24/24 1430  azithromycin  (ZITHROMAX ) 500 mg in sodium chloride  0.9 % 250 mL IVPB         500 mg 250 mL/hr over 60 Minutes Intravenous  Once 10/24/24 1419 10/24/24 1724       Medications: Scheduled Meds:  aspirin  EC  81 mg Oral Q breakfast   Chlorhexidine  Gluconate Cloth  6 each Topical Q0600   cinacalcet   60 mg Oral Q breakfast   gabapentin   300 mg Oral Daily   heparin   5,000 Units Subcutaneous Q8H   insulin  aspart  0-5 Units Subcutaneous QHS   insulin  aspart  0-9 Units Subcutaneous TID WC   midodrine   20 mg Oral TID   mupirocin  ointment  1 Application Nasal BID   sevelamer  carbonate  4,000 mg Oral TID WC   Continuous Infusions:  anticoagulant sodium citrate      norepinephrine  (LEVOPHED ) Adult infusion 3 mcg/min (10/26/24 1014)   vancomycin  Stopped (10/25/24 1907)   PRN Meds:.acetaminophen  **OR** acetaminophen , alteplase , anticoagulant sodium citrate , heparin , heparin , ipratropium-albuterol , lidocaine  (PF), lidocaine -prilocaine , ondansetron  **OR** ondansetron  (ZOFRAN ) IV, mouth rinse, pentafluoroprop-tetrafluoroeth, polyethylene glycol    Objective: Weight change: -0.827 kg  Intake/Output Summary (Last 24 hours) at 10/26/2024 1042 Last data filed at 10/26/2024 1014 Gross per 24 hour  Intake 1622.82 ml  Output 1000 ml  Net 622.82 ml  Blood pressure 95/60, pulse 90, temperature 97.7 F (36.5 C), temperature source Oral, resp. rate 14, height 6' 1 (1.854 m), weight 104.4 kg, SpO2 97%. Temp:  [97.7 F (36.5 C)-98.6 F (37 C)] 97.7 F (36.5 C) (12/10 0830) Pulse Rate:  [85-102] 90 (12/10 1015) Resp:  [0-32] 14 (12/10 1015) BP: (90-98)/(54-60) 95/60 (12/09 1243) SpO2:  [85 %-100 %] 97 % (12/10 1015) Arterial Line BP: (77-137)/(45-94) 84/54 (12/10 1015) Weight:  [102.5 kg-104.4 kg] 104.4 kg (12/10 0417)  Physical Exam: Physical Exam Constitutional:      Appearance: Normal appearance.  HENT:     Head: Normocephalic and atraumatic.  Eyes:     General:        Right eye: No discharge.        Left eye: No discharge.     Extraocular Movements:  Extraocular movements intact.  Cardiovascular:     Rate and Rhythm: Normal rate and regular rhythm.  Pulmonary:     Effort: No respiratory distress.     Breath sounds: No wheezing.  Abdominal:     General: There is no distension.  Musculoskeletal:        General: Normal range of motion.     Cervical back: Normal range of motion and neck supple.  Skin:    General: Skin is warm and dry.  Neurological:     General: No focal deficit present.     Mental Status: He is alert and oriented to person, place, and time.  Psychiatric:        Mood and Affect: Mood normal.        Behavior: Behavior normal.        Thought Content: Thought content normal.        Judgment: Judgment normal.     HD catheter not overtly infected  CBC:    BMET Recent Labs    10/24/24 1112 10/25/24 0442  NA 130* 130*  K 3.7 4.1  CL 87* 88*  CO2 22 19*  GLUCOSE 105* 215*  BUN 51* 63*  CREATININE 10.50* 11.20*  CALCIUM  9.6 9.2     Liver Panel  Recent Labs    10/24/24 1457 10/25/24 0442  PROT 7.8  --   ALBUMIN  3.0* 3.0*  AST 23  --   ALT 10  --   ALKPHOS 118  --   BILITOT 2.4*  --   BILIDIR 1.7*  --   IBILI 0.7  --        Sedimentation Rate No results for input(s): ESRSEDRATE in the last 72 hours. C-Reactive Protein No results for input(s): CRP in the last 72 hours.  Micro Results: Recent Results (from the past 720 hours)  Resp panel by RT-PCR (RSV, Flu A&B, Covid) Anterior Nasal Swab     Status: None   Collection Time: 10/24/24 10:20 AM   Specimen: Anterior Nasal Swab  Result Value Ref Range Status   SARS Coronavirus 2 by RT PCR NEGATIVE NEGATIVE Final    Comment: (NOTE) SARS-CoV-2 target nucleic acids are NOT DETECTED.  The SARS-CoV-2 RNA is generally detectable in upper respiratory specimens during the acute phase of infection. The lowest concentration of SARS-CoV-2 viral copies this assay can detect is 138 copies/mL. A negative result does not preclude  SARS-Cov-2 infection and should not be used as the sole basis for treatment or other patient management decisions. A negative result may occur with  improper specimen collection/handling, submission of specimen other than nasopharyngeal swab, presence of viral mutation(s) within the areas targeted  by this assay, and inadequate number of viral copies(<138 copies/mL). A negative result must be combined with clinical observations, patient history, and epidemiological information. The expected result is Negative.  Fact Sheet for Patients:  bloggercourse.com  Fact Sheet for Healthcare Providers:  seriousbroker.it  This test is no t yet approved or cleared by the [place]  FDA and  has been authorized for detection and/or diagnosis of SARS-CoV-2 by FDA under an Emergency Use Authorization (EUA). This EUA will remain  in effect (meaning this test can be used) for the duration of the COVID-19 declaration under Section 564(b)(1) of the Act, 21 U.S.C.section 360bbb-3(b)(1), unless the authorization is terminated  or revoked sooner.       Influenza A by PCR NEGATIVE NEGATIVE Final   Influenza B by PCR NEGATIVE NEGATIVE Final    Comment: (NOTE) The Xpert Xpress SARS-CoV-2/FLU/RSV plus assay is intended as an aid in the diagnosis of influenza from Nasopharyngeal swab specimens and should not be used as a sole basis for treatment. Nasal washings and aspirates are unacceptable for Xpert Xpress SARS-CoV-2/FLU/RSV testing.  Fact Sheet for Patients: bloggercourse.com  Fact Sheet for Healthcare Providers: seriousbroker.it  This test is not yet approved or cleared by the [place]  FDA and has been authorized for detection and/or diagnosis of SARS-CoV-2 by FDA under an Emergency Use Authorization (EUA). This EUA will remain in effect (meaning this test can be used) for the duration of  the COVID-19 declaration under Section 564(b)(1) of the Act, 21 U.S.C. section 360bbb-3(b)(1), unless the authorization is terminated or revoked.     Resp Syncytial Virus by PCR NEGATIVE NEGATIVE Final    Comment: (NOTE) Fact Sheet for Patients: bloggercourse.com  Fact Sheet for Healthcare Providers: seriousbroker.it  This test is not yet approved or cleared by the [place]  FDA and has been authorized for detection and/or diagnosis of SARS-CoV-2 by FDA under an Emergency Use Authorization (EUA). This EUA will remain in effect (meaning this test can be used) for the duration of the COVID-19 declaration under Section 564(b)(1) of the Act, 21 U.S.C. section 360bbb-3(b)(1), unless the authorization is terminated or revoked.  Performed at [facility], [address] [place], [place] [address]   Culture, blood (routine x 2)     Status: None (Preliminary result)   Collection Time: 10/24/24  2:07 PM   Specimen: BLOOD RIGHT FOREARM  Result Value Ref Range Status   Specimen Description   Final    BLOOD RIGHT FOREARM BOTTLES DRAWN AEROBIC AND ANAEROBIC   Special Requests   Final    Blood Culture results may not be optimal due to an inadequate volume of blood received in culture bottles   Culture  Setup Time   Final    IN BOTH AEROBIC AND ANAEROBIC BOTTLES GRAM POSITIVE COCCI IN CLUSTERS Gram Stain Report Called to,Read Back By and Verified With: K. [facility] RN 10/25/24 @0709  BY J. WHITE Performed at [facility], [address] [place], [place] [address]    Culture [facility] POSITIVE COCCI  Final   Report Status PENDING  Incomplete  Culture, blood (routine x 2)     Status: Abnormal (Preliminary result)   Collection Time: 10/24/24  2:51 PM   Specimen: BLOOD  Result Value Ref Range Status   Specimen Description   Final    BLOOD BLOOD RIGHT ARM Performed at [facility], [address] [place], [place] [address]    Special Requests    Final    BOTTLES DRAWN AEROBIC AND ANAEROBIC Blood  Culture adequate volume Performed at [facility], [address] [place], [place] [address]    Culture  Setup Time   Final    IN BOTH AEROBIC AND ANAEROBIC BOTTLES GRAM POSITIVE COCCI IN CLUSTERS Gram Stain Report Called to,Read Back By and Verified With: K. [facility] RN 10/25/24 @0709  BY J. WHITE CRITICAL RESULT CALLED TO, READ BACK BY AND VERIFIED WITH: PHARMD LAURIE POOLE 10/25/24 @1034  PR    Culture (A)  Final    STAPHYLOCOCCUS AUREUS SUSCEPTIBILITIES TO FOLLOW Performed at [facility], [address] [place], [place] [address]    Report Status PENDING  Incomplete  Blood Culture ID Panel (Reflexed)     Status: Abnormal   Collection Time: 10/24/24  2:51 PM  Result Value Ref Range Status   Enterococcus faecalis NOT DETECTED NOT DETECTED Final   Enterococcus Faecium NOT DETECTED NOT DETECTED Final   Listeria monocytogenes NOT DETECTED NOT DETECTED Final   Staphylococcus species DETECTED (A) NOT DETECTED Final    Comment: CRITICAL RESULT CALLED TO, READ BACK BY AND VERIFIED WITH: PHARMD LAURIE POOLE 10/25/24 @1034  PR    Staphylococcus aureus (BCID) DETECTED (A) NOT DETECTED Final    Comment: Methicillin (oxacillin)-resistant Staphylococcus aureus (MRSA). MRSA is predictably resistant to beta-lactam antibiotics (except ceftaroline). Preferred therapy is vancomycin  unless clinically contraindicated. Patient requires contact precautions if  hospitalized. CRITICAL RESULT CALLED TO, READ BACK BY AND VERIFIED WITH: PHARMD LAURIE POOLE 10/25/24 @1034  PR    Staphylococcus epidermidis NOT DETECTED NOT DETECTED Final   Staphylococcus lugdunensis NOT DETECTED NOT DETECTED Final   Streptococcus species NOT DETECTED NOT DETECTED Final   Streptococcus agalactiae NOT DETECTED NOT DETECTED Final   Streptococcus pneumoniae NOT DETECTED NOT DETECTED Final   Streptococcus pyogenes NOT DETECTED NOT DETECTED Final   A.calcoaceticus-baumannii  NOT DETECTED NOT DETECTED Final   Bacteroides fragilis NOT DETECTED NOT DETECTED Final   Enterobacterales NOT DETECTED NOT DETECTED Final   Enterobacter cloacae complex NOT DETECTED NOT DETECTED Final   Escherichia coli NOT DETECTED NOT DETECTED Final   Klebsiella aerogenes NOT DETECTED NOT DETECTED Final   Klebsiella oxytoca NOT DETECTED NOT DETECTED Final   Klebsiella pneumoniae NOT DETECTED NOT DETECTED Final   Proteus species NOT DETECTED NOT DETECTED Final   Salmonella species NOT DETECTED NOT DETECTED Final   Serratia marcescens NOT DETECTED NOT DETECTED Final   Haemophilus influenzae NOT DETECTED NOT DETECTED Final   Neisseria meningitidis NOT DETECTED NOT DETECTED Final   Pseudomonas aeruginosa NOT DETECTED NOT DETECTED Final   Stenotrophomonas maltophilia NOT DETECTED NOT DETECTED Final   Candida albicans NOT DETECTED NOT DETECTED Final   Candida auris NOT DETECTED NOT DETECTED Final   Candida glabrata NOT DETECTED NOT DETECTED Final   Candida krusei NOT DETECTED NOT DETECTED Final   Candida parapsilosis NOT DETECTED NOT DETECTED Final   Candida tropicalis NOT DETECTED NOT DETECTED Final   Cryptococcus neoformans/gattii NOT DETECTED NOT DETECTED Final   Meth resistant mecA/C and MREJ DETECTED (A) NOT DETECTED Final    Comment: CRITICAL RESULT CALLED TO, READ BACK BY AND VERIFIED WITH: PHARMD LAURIE POOLE 10/25/24 @1034  PR Performed at [facility], [address], [place], [place] [address]   MRSA Next Gen by PCR, Nasal     Status: Abnormal   Collection Time: 10/24/24  4:33 PM   Specimen: Nasal Mucosa; Nasal Swab  Result Value Ref Range Status   MRSA by PCR Next Gen DETECTED (A) NOT DETECTED Final    Comment: RESULT CALLED  TO, READ BACK BY AND VERIFIED WITH: C KINSLEY,RN@1950  1208/25 MK (NOTE) The GeneXpert MRSA Assay (FDA approved for NASAL specimens only), is one component of a comprehensive MRSA colonization surveillance program. It is not intended to diagnose  MRSA infection nor to guide or monitor treatment for MRSA infections. Test performance is not FDA approved in patients less than 79 years old. Performed at [facility], [address], [place], [place] [address]     Studies/Results: CT Chest Wo Contrast Result Date: 10/24/2024 CLINICAL DATA:  Pneumonia. Shortness of breath after dialysis Friday. EXAM: CT CHEST WITHOUT CONTRAST TECHNIQUE: Multidetector CT imaging of the chest was performed following the standard protocol without IV contrast. RADIATION DOSE REDUCTION: This exam was performed according to the departmental dose-optimization program which includes automated exposure control, adjustment of the mA and/or kV according to patient size and/or use of iterative reconstruction technique. COMPARISON:  11/08/2023. FINDINGS: Cardiovascular: Atherosclerotic calcification of the aorta, aortic valve and coronary arteries. Enlarged pulmonic trunk and heart. No pericardial effusion. Left IJ dialysis catheter terminates in the right atrium. Mediastinum/Nodes: No pathologically enlarged mediastinal or axillary lymph nodes. Hilar regions are difficult to definitively evaluate without IV contrast. Esophagus is grossly unremarkable. Lungs/Pleura: Image quality is degraded by expiratory phase imaging and respiratory motion. Peribronchovascular ground-glass and consolidation in the anterior segment right upper lobe and likely elsewhere within the right middle and right lower lobes. Tiny right and moderate left pleural effusion, the latter of which is partially loculated. Compressive collapse/consolidation in the left lower lobe and to a lesser extent, lingula. There may be tracheobronchomalacia. Upper Abdomen: Liver margin is slightly irregular. Bilateral renal stones and/or renal vascular calcifications. Visualized portions of the liver, gallbladder, adrenal glands, kidneys, spleen, pancreas, stomach and bowel are otherwise grossly unremarkable. No upper abdominal  adenopathy. Slight nonspecific haziness in the left upper quadrant omentum. Musculoskeletal: Degenerative changes in the spine. Old sternal fracture. IMPRESSION: 1. Multilobar bronchopneumonia. 2. Tiny right and moderate partially loculated left pleural effusions. Collapse/consolidation in the left lower lobe may be due to atelectasis. Difficult to exclude pneumonia. 3. 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 4. Liver margin is slightly irregular, raising suspicion for cirrhosis. 5. Bilateral renal stones and/or renal vascular calcifications. 6. Aortic atherosclerosis (ICD10-I70.0). Coronary artery calcification. 7. Enlarged pulmonic trunk, indicative of pulmonary arterial hypertension. Electronically Signed   By: Newell Eke M.D.   On: 10/24/2024 14:15      Assessment/Plan:  INTERVAL HISTORY: pt had HD   Principal Problem:   Multifocal pneumonia Active Problems:   ESRD on dialysis (HCC)   DM2 (diabetes mellitus, type 2) (HCC)   MRSA bacteremia   History of CVA    Hypotension   Below-knee amputation of right lower extremity (HCC)   CAD (coronary artery disease)   Type 2 diabetes mellitus with diabetic chronic kidney disease (HCC)   Chronic systolic CHF (congestive heart failure) (HCC)   Peripheral vascular disease, unspecified   Severe sepsis with septic shock    [name] [name] is a 63 y.o. male with end-stage renal disease on hemodialysis now admitted with methicillin-resistant Staphylococcus aureus bacteremia with septic shock multifocal pneumonia and a loculated pleural effusion  He needs TTE and then TEE  He will need a central line holiday.   Plan is for HD catheter removal Friday and no lines over the weekend, with new line on Monday  I will order another set of repeat blood cultures after his catheter is removed  It is critical that he has the thoracentesis both from  a diagnostic and therapeutic perspective so that we can have source control as far as his pleural effusion if  it is an empyema.  He should be on contact precautions for MRSA.   I personally spent a total of 50 minutes in the care of the patient today including preparing to see the patient, getting/reviewing separately obtained history, performing a medically appropriate exam/evaluation, counseling and educating, placing orders, referring and communicating with other health care professionals, documenting clinical information in the EHR, independently interpreting results, communicating results, and coordinating care.   Evaluation of the patient requires complex antimicrobial therapy evaluation, counseling , isolation needs to reduce disease transmission and risk assessment and mitigation.      LOS: 2 days   Jomarie Fleeta Rothman 10/26/2024, 10:42 AM

## 2024-10-26 NOTE — Plan of Care (Signed)
   Problem: Education: Goal: Knowledge of General Education information will improve Description Including pain rating scale, medication(s)/side effects and non-pharmacologic comfort measures Outcome: Progressing   Problem: Health Behavior/Discharge Planning: Goal: Ability to manage health-related needs will improve Outcome: Progressing

## 2024-10-26 NOTE — Progress Notes (Signed)
 PROGRESS NOTE  [name] [name] FMW:[id number] DOB: [birth year] DOA: 10/24/2024 PCP: [name], MD  Brief History:  63 y.o. male with medical history significant for ESRD on dialysis, atrial fibrillation, systolic CHF, hypertension, diabetes, LBBB.  Patient presented to the ED with complaints of difficulty breathing yesterday about 2 to 3 weeks ago.  He has a bad cough that has been going on for about 15 years, but has worsened from baseline.  Reported some dizziness over the past few days, present to respectable position, and intermittent.  He denies dizziness at this time.  No chest pain, no lower extremity swelling.  Reports he never smoked cigarettes.  His last dialysis was on Friday- 2/5.  He did not get dialyzed today, and has not taken his medications today which include midodrine .  He was in the ED 12/2 with reports of dysuria.  He makes very little urine.  He was started on a course of ciprofloxacin  which he started 12/3, and has been compliant.  He was also started on doxycycline  11/30 for chest infiltrate-thought more likely to be fluid related.   ED Course: Tmax 98.6.  Heart rate 66-91.  Respirate rate 16.  Blood pressure systolic initially 130s now down to 60s on my evaluation.  O2 sats greater 93% on room air.  Lactic acid 2.1.  WBC 12.9.  Sodium 130.  COVID influenza RSV negative.  Troponin 225 >> 216.  CT chest without contrast-multifocal bronchopneumonia, tiny right and moderate partially loculated left pleural effusion.  Collapse/consolidation in left lower lobe may be due to atelectasis, difficult to exclude pneumonia.  Nephrology consulted, no emergent need for dialysis.  IV ceftriaxone  and azithromycin  started.  500ml N/s bolus given.   Assessment/Plan: Severe sepsis with septic shock  - secondary to multifocal pneumonia and MRSA bacteremia - blood cultures positive for MRSA - continue IV vancomycin  - ID consultation requested - remains on IV norepinephrine   infusion    MRSA bacteremia  - 10/24/24 blood culture = MRSA - HD cath will likely need to be removed>>planned after HD on 12/12 - TTE, then TEE - follow ID consult and recommendations - pt will require line holiday on sat/sun  Left pleural effusion -10/26/2024--left thoracocentesis>> sent for cell count and cultures -If patient has empyema, he will need decortication   Chronic hypotension  - he is normally on midodrine  TID - he now presents with sepsis physiology - he is on IV norepinephrine  now at 4 mcg/kg/min - wean off pressor as able  - midodrine  dose increased to 20 mg   ESRD on HD MWF - pt had inpatient HD 12/9 - consultation from nephrologist   Chronic HFrEF - volume management mostly with HD - makes small amount of urine - compensated   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   Objective: Vitals:   10/26/24 1215 10/26/24 1230 10/26/24  1245 10/26/24 1257  BP:      Pulse: 86 86 85 89  Resp: 18 18  (!) 22  Temp:      TempSrc:      SpO2: 98% 96% 96% 98%  Weight:      Height:        Intake/Output Summary (Last 24 hours) at 10/26/2024 1417 Last data filed at 10/26/2024 1014 Gross per 24 hour  Intake 1382.82 ml  Output --  Net 1382.82 ml   Weight change: -0.827 kg Exam:  General:  Pt is alert, follows commands appropriately, not in acute distress HEENT: No icterus, No thrush, No neck  mass, /AT Cardiovascular: RRR, S1/S2, no rubs, no gallops Respiratory: Scattered bilateral rales.  No wheezing.  Good air movement Abdomen: Soft/+BS, non tender, non distended, no guarding Extremities: No edema, No lymphangitis, No petechiae, No rashes, no synovitis; right BKA without any open wounds or drainage or erythema.   Data Reviewed: I have personally reviewed following labs and imaging studies Basic Metabolic Panel: Recent Labs  Lab 10/24/24 1112 10/25/24 0442  NA 130* 130*  K 3.7 4.1  CL 87* 88*  CO2 22 19*  GLUCOSE 105* 215*  BUN 51* 63*  CREATININE 10.50* 11.20*  CALCIUM  9.6 9.2  PHOS  --  8.0*   Liver Function Tests: Recent Labs  Lab 10/24/24 1457 10/25/24 0442  AST 23  --   ALT 10  --   ALKPHOS 118  --   BILITOT 2.4*  --   PROT 7.8  --   ALBUMIN  3.0* 3.0*   No results for input(s): LIPASE, AMYLASE in the last 168 hours. No results for input(s): AMMONIA in the last 168 hours. Coagulation Profile: No results for input(s): INR, PROTIME in the last 168 hours. CBC: Recent Labs  Lab 10/24/24 1014 10/25/24 0442  WBC 10.9* 16.1*  NEUTROABS 8.0*  --   HGB 14.0 12.6*  HCT 43.1 37.8*  MCV 75.3* 74.6*  PLT 146* 185   Cardiac Enzymes: No results for input(s): CKTOTAL, CKMB, CKMBINDEX, TROPONINI in the last 168 hours. BNP: Invalid input(s): POCBNP CBG: Recent Labs  Lab 10/25/24 1620 10/25/24 2319 10/26/24 0307 10/26/24 0750 10/26/24 1133  GLUCAP 148* 247* 219* 277* 199*   HbA1C: Recent Labs    10/24/24 1014  HGBA1C 8.6*   Urine analysis:    Component Value Date/Time   COLORURINE AMBER (A) 07/01/2022 1143   APPEARANCEUR CLEAR 07/01/2022 1143   LABSPEC 1.017 07/01/2022 1143   PHURINE 7.0 07/01/2022 1143   GLUCOSEU NEGATIVE 07/01/2022 1143   HGBUR NEGATIVE 07/01/2022 1143   BILIRUBINUR NEGATIVE 07/01/2022 1143   KETONESUR NEGATIVE 07/01/2022 1143   PROTEINUR >=300 (A) 07/01/2022 1143   NITRITE NEGATIVE 07/01/2022  1143   LEUKOCYTESUR NEGATIVE 07/01/2022 1143   Sepsis Labs: @LABRCNTIP (procalcitonin:4,lacticidven:4) ) Recent Results (from the past 240 hours)  Resp panel by RT-PCR (RSV, Flu A&B, Covid) Anterior Nasal Swab     Status: None   Collection Time: 10/24/24 10:20 AM   Specimen: Anterior Nasal Swab  Result Value Ref Range Status   SARS Coronavirus 2 by RT PCR NEGATIVE NEGATIVE Final    Comment: (NOTE) SARS-CoV-2 target nucleic acids are NOT DETECTED.  The SARS-CoV-2 RNA is generally detectable in upper respiratory specimens during the acute phase of infection. The lowest concentration of SARS-CoV-2 viral copies this assay can detect is 138 copies/mL. A negative result does not preclude SARS-Cov-2 infection and should not be used as the sole basis for treatment or  other patient management decisions. A negative result may occur with  improper specimen collection/handling, submission of specimen other than nasopharyngeal swab, presence of viral mutation(s) within the areas targeted by this assay, and inadequate number of viral copies(<138 copies/mL). A negative result must be combined with clinical observations, patient history, and epidemiological information. The expected result is Negative.  Fact Sheet for Patients:  bloggercourse.com  Fact Sheet for Healthcare Providers:  seriousbroker.it  This test is no t yet approved or cleared by the [place]  FDA and  has been authorized for detection and/or diagnosis of SARS-CoV-2 by FDA under an Emergency Use Authorization (EUA). This EUA will remain  in effect (meaning this test can be used) for the duration of the COVID-19 declaration under Section 564(b)(1) of the Act, 21 U.S.C.section 360bbb-3(b)(1), unless the authorization is terminated  or revoked sooner.       Influenza A by PCR NEGATIVE NEGATIVE Final   Influenza B by PCR NEGATIVE NEGATIVE Final    Comment: (NOTE) The  Xpert Xpress SARS-CoV-2/FLU/RSV plus assay is intended as an aid in the diagnosis of influenza from Nasopharyngeal swab specimens and should not be used as a sole basis for treatment. Nasal washings and aspirates are unacceptable for Xpert Xpress SARS-CoV-2/FLU/RSV testing.  Fact Sheet for Patients: bloggercourse.com  Fact Sheet for Healthcare Providers: seriousbroker.it  This test is not yet approved or cleared by the [place]  FDA and has been authorized for detection and/or diagnosis of SARS-CoV-2 by FDA under an Emergency Use Authorization (EUA). This EUA will remain in effect (meaning this test can be used) for the duration of the COVID-19 declaration under Section 564(b)(1) of the Act, 21 U.S.C. section 360bbb-3(b)(1), unless the authorization is terminated or revoked.     Resp Syncytial Virus by PCR NEGATIVE NEGATIVE Final    Comment: (NOTE) Fact Sheet for Patients: bloggercourse.com  Fact Sheet for Healthcare Providers: seriousbroker.it  This test is not yet approved or cleared by the [place]  FDA and has been authorized for detection and/or diagnosis of SARS-CoV-2 by FDA under an Emergency Use Authorization (EUA). This EUA will remain in effect (meaning this test can be used) for the duration of the COVID-19 declaration under Section 564(b)(1) of the Act, 21 U.S.C. section 360bbb-3(b)(1), unless the authorization is terminated or revoked.  Performed at [facility], [address] [place], [place] [address]   Culture, blood (routine x 2)     Status: Abnormal (Preliminary result)   Collection Time: 10/24/24  2:07 PM   Specimen: BLOOD RIGHT FOREARM  Result Value Ref Range Status   Specimen Description   Final    BLOOD RIGHT FOREARM BOTTLES DRAWN AEROBIC AND ANAEROBIC Performed at [facility], [address], [place], [place] [address]    Special Requests    Final    Blood Culture results may not be optimal due to an inadequate volume of blood received in culture bottles Performed at [facility], [address] [place], [place] [address]    Culture  Setup Time   Final    IN BOTH AEROBIC AND ANAEROBIC BOTTLES GRAM POSITIVE COCCI IN CLUSTERS Gram Stain Report Called to,Read Back By and Verified With: K. [facility] RN 10/25/24 @0709  BY J. WHITE Performed at [facility], [address], [place], [place] [address]    Culture (A)  Final    STAPHYLOCOCCUS AUREUS SUSCEPTIBILITIES TO FOLLOW Performed at [facility], [address] [place], [place] [address]    Report Status PENDING  Incomplete  Culture, blood (routine x 2)     Status: Abnormal (Preliminary result)   Collection Time: 10/24/24  2:51 PM   Specimen: BLOOD  Result Value Ref Range Status   Specimen Description   Final    BLOOD BLOOD RIGHT ARM Performed at [facility], [address] [place], [place] [address]    Special Requests   Final    BOTTLES DRAWN AEROBIC AND ANAEROBIC Blood Culture adequate volume Performed at [facility], [address], [place], [place] [address]    Culture  Setup Time   Final    IN BOTH AEROBIC AND ANAEROBIC BOTTLES GRAM POSITIVE COCCI IN CLUSTERS Gram Stain Report Called to,Read Back By and Verified With: K. [facility] RN 10/25/24 @0709  BY J. WHITE CRITICAL RESULT CALLED TO, READ BACK BY AND VERIFIED WITH: PHARMD LAURIE POOLE 10/25/24 @1034  PR    Culture (A)  Final    STAPHYLOCOCCUS AUREUS SUSCEPTIBILITIES TO FOLLOW Performed at [facility], [address], [place], [place] [address]    Report Status PENDING  Incomplete  Blood Culture ID Panel (Reflexed)     Status: Abnormal   Collection Time: 10/24/24  2:51 PM  Result Value Ref Range Status   Enterococcus faecalis NOT DETECTED NOT DETECTED Final   Enterococcus Faecium NOT DETECTED NOT DETECTED Final   Listeria monocytogenes NOT DETECTED NOT DETECTED Final   Staphylococcus species  DETECTED (A) NOT DETECTED Final    Comment: CRITICAL RESULT CALLED TO, READ BACK BY AND VERIFIED WITH: PHARMD LAURIE POOLE 10/25/24 @1034  PR    Staphylococcus aureus (BCID) DETECTED (A) NOT DETECTED Final    Comment: Methicillin (oxacillin)-resistant Staphylococcus aureus (MRSA). MRSA is predictably resistant to beta-lactam antibiotics (except ceftaroline). Preferred therapy is vancomycin  unless clinically contraindicated. Patient requires contact precautions if  hospitalized. CRITICAL RESULT CALLED TO, READ BACK BY AND VERIFIED WITH: PHARMD LAURIE POOLE 10/25/24 @1034  PR    Staphylococcus epidermidis NOT DETECTED NOT DETECTED Final   Staphylococcus lugdunensis NOT DETECTED NOT DETECTED Final   Streptococcus species NOT DETECTED NOT DETECTED Final   Streptococcus agalactiae NOT DETECTED NOT DETECTED Final   Streptococcus pneumoniae NOT DETECTED NOT DETECTED Final   Streptococcus pyogenes NOT DETECTED NOT DETECTED Final   A.calcoaceticus-baumannii NOT DETECTED NOT DETECTED Final   Bacteroides fragilis NOT DETECTED NOT DETECTED Final   Enterobacterales NOT DETECTED NOT DETECTED Final   Enterobacter cloacae complex NOT DETECTED NOT DETECTED Final   Escherichia coli NOT DETECTED NOT DETECTED Final   Klebsiella aerogenes NOT DETECTED NOT DETECTED Final   Klebsiella oxytoca NOT DETECTED NOT DETECTED Final   Klebsiella pneumoniae NOT DETECTED NOT DETECTED Final   Proteus species NOT DETECTED NOT DETECTED Final   Salmonella species NOT DETECTED NOT DETECTED Final   Serratia marcescens NOT DETECTED NOT DETECTED Final   Haemophilus influenzae NOT DETECTED NOT DETECTED Final   Neisseria meningitidis NOT DETECTED NOT DETECTED Final   Pseudomonas aeruginosa NOT DETECTED NOT DETECTED Final   Stenotrophomonas maltophilia NOT DETECTED NOT DETECTED Final   Candida albicans NOT DETECTED NOT DETECTED Final   Candida auris NOT DETECTED NOT DETECTED Final   Candida glabrata NOT DETECTED NOT DETECTED  Final   Candida krusei NOT DETECTED NOT DETECTED Final   Candida parapsilosis NOT DETECTED NOT DETECTED Final   Candida tropicalis NOT DETECTED NOT DETECTED Final   Cryptococcus neoformans/gattii NOT DETECTED NOT DETECTED Final   Meth resistant mecA/C and MREJ DETECTED (A) NOT DETECTED Final    Comment: CRITICAL RESULT CALLED TO, READ BACK BY AND  VERIFIED WITH: PHARMD LAURIE POOLE 10/25/24 @1034  PR Performed at [facility], [address], [place], [place] [address]   MRSA Next Gen by PCR, Nasal     Status: Abnormal   Collection Time: 10/24/24  4:33 PM   Specimen: Nasal Mucosa; Nasal Swab  Result Value Ref Range Status   MRSA by PCR Next Gen DETECTED (A) NOT DETECTED Final    Comment: RESULT CALLED TO, READ BACK BY AND VERIFIED WITH: C KINSLEY,RN@1950  1208/25 MK (NOTE) The GeneXpert MRSA Assay (FDA approved for NASAL specimens only), is one component of a comprehensive MRSA colonization surveillance program. It is not intended to diagnose MRSA infection nor to guide or monitor treatment for MRSA infections. Test performance is not FDA approved in patients less than 61 years old. Performed at [facility], [address], [place],  [address]      Scheduled Meds:  aspirin  EC  81 mg Oral Q breakfast   Chlorhexidine  Gluconate Cloth  6 each Topical Q0600   cinacalcet   60 mg Oral Q breakfast   gabapentin   300 mg Oral Daily   heparin   5,000 Units Subcutaneous Q8H   insulin  aspart  0-5 Units Subcutaneous QHS   insulin  aspart  0-9 Units Subcutaneous TID WC   midodrine   20 mg Oral TID   mupirocin  ointment  1 Application Nasal BID   sevelamer  carbonate  4,000 mg Oral TID WC   Continuous Infusions:  anticoagulant sodium citrate      norepinephrine  (LEVOPHED ) Adult infusion 3 mcg/min (10/26/24 1014)   vancomycin  Stopped (10/25/24 1907)    Procedures/Studies: CT Chest Wo Contrast Result Date: 10/24/2024 CLINICAL DATA:  Pneumonia. Shortness of breath after dialysis Friday.  EXAM: CT CHEST WITHOUT CONTRAST TECHNIQUE: Multidetector CT imaging of the chest was performed following the standard protocol without IV contrast. RADIATION DOSE REDUCTION: This exam was performed according to the departmental dose-optimization program which includes automated exposure control, adjustment of the mA and/or kV according to patient size and/or use of iterative reconstruction technique. COMPARISON:  11/08/2023. FINDINGS: Cardiovascular: Atherosclerotic calcification of the aorta, aortic valve and coronary arteries. Enlarged pulmonic trunk and heart. No pericardial effusion. Left IJ dialysis catheter terminates in the right atrium. Mediastinum/Nodes: No pathologically enlarged mediastinal or axillary lymph nodes. Hilar regions are difficult to definitively evaluate without IV contrast. Esophagus is grossly unremarkable. Lungs/Pleura: Image quality is degraded by expiratory phase imaging and respiratory motion. Peribronchovascular ground-glass and consolidation in the anterior segment right upper lobe and likely elsewhere within the right middle and right lower lobes. Tiny right and moderate left pleural effusion, the latter of which is partially loculated. Compressive collapse/consolidation in the left lower lobe and to a lesser extent, lingula. There may be tracheobronchomalacia. Upper Abdomen: Liver margin is slightly irregular. Bilateral renal stones and/or renal vascular calcifications. Visualized portions of the liver, gallbladder, adrenal glands, kidneys, spleen, pancreas, stomach and bowel are otherwise grossly unremarkable. No upper abdominal adenopathy. Slight nonspecific haziness in the left upper quadrant omentum. Musculoskeletal: Degenerative changes in the spine. Old sternal fracture. IMPRESSION: 1. Multilobar bronchopneumonia. 2. Tiny right and moderate partially loculated left pleural effusions. Collapse/consolidation in the left lower lobe may be due to atelectasis. Difficult to exclude  pneumonia. 3. 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 4. Liver margin is slightly irregular, raising suspicion for cirrhosis. 5. Bilateral renal stones and/or renal vascular calcifications. 6. Aortic atherosclerosis (ICD10-I70.0). Coronary artery calcification. 7. Enlarged pulmonic trunk, indicative of pulmonary arterial hypertension. Electronically Signed   By: Newell Eke M.D.   On:  10/24/2024 14:15   DG Chest Port 1 View Result Date: 10/24/2024 CLINICAL DATA:  Shortness of breath. EXAM: PORTABLE CHEST 1 VIEW COMPARISON:  10/16/2024 FINDINGS: Low volume film. The cardio pericardial silhouette is enlarged. Retrocardiac collapse/consolidation with effusion is similar to prior. No pulmonary edema. Left IJ central line tip overlies the mid SVC level. IMPRESSION: Low volume film with persistent retrocardiac collapse/consolidation and effusion. Electronically Signed   By: Camellia Candle M.D.   On: 10/24/2024 11:31   DG Chest Port 1 View Result Date: 10/16/2024 EXAM: 1 VIEW(S) XRAY OF THE CHEST 10/16/2024 12:11:00 PM COMPARISON: 01/13/2024 CLINICAL HISTORY: sob FINDINGS: LINES, TUBES AND DEVICES: Tunneled left IJ hemodialysis catheter with tip at superior cavoatrial junction. LUNGS AND PLEURA: Low lung volumes. New left retrocardiac airspace opacity. Possible small left pleural effusion. No pneumothorax. HEART AND MEDIASTINUM: The cardiac silhouette is unremarkable. Aortic atherosclerosis is present. BONES AND SOFT TISSUES: No acute osseous abnormality. IMPRESSION: 1. New left retrocardiac airspace opacity and possible small left pleural effusion. 2. Low lung volumes. Electronically signed by: Evalene Coho MD 10/16/2024 12:16 PM EST RP Workstation: DARYLENE Alm Schneider, DO  [facility]  If 7PM-7AM, please contact night-coverage [email or web address] Password [email or web address] 10/26/2024, 2:17 PM   LOS: 2 days

## 2024-10-26 NOTE — Consult Note (Signed)
 [facility] Consult  Reason for Consult: Tunneled hemodialysis catheter secondary to bacteremia Referring Physician: Dr. Vicci  Chief Complaint   Shortness of Breath     HPI: [name] COLLEGE Sr. is a 63 y.o. male who was admitted to the hospital secondary to septic shock thought to be from multifocal pneumonia and MRSA bacteremia.  His initial blood cultures came back positive for MRSA and he was started on IV antibiotics.  He has end-stage renal disease on dialysis, and currently receives dialysis through a tunneled left EJ hemodialysis catheter.  General surgery was consulted for removal of the tunneled hemodialysis catheter.  He has a past medical history significant for heart failure, hypertension, nonischemic cardiomyopathy, coronary artery disease, peripheral arterial disease, peripheral vascular disease, diabetes, and ESRD on HD.  He has undergone multiple vascular procedures for dialysis access and right BKA.  He believes his current dialysis catheter was placed at the beginning of the year.  Based on IR notes, catheter was placed on 02/16/2024.  Past Medical History:  Diagnosis Date   ESRD on hemodialysis (HCC)    a. HD MWF > 10 years.   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 01/2022 Echo: EF 45-50%, glob HK, mod LVH, mildly reduced RV fxn, mild MR, mod-sev MV Ca2+. Mild AS; b. 10/2023 Echo: EF 45-50%, glob HK, GrII DD, mildly reduced RV fxn, RVSP 52.69mmHg. Mild MR/MS, mod TR.   Hypertension    Nonischemic cardiomyopathy (HCC)    a. 2019 Cath [facility]): Nonobstructive disease; b. 12/2019 Echo: EF 50%; c. 12/2019 MV (Tx w/u [facility]): Ant infarct w/ peri-infarct ischemia. EF 35%; c. 05/2021 MV (Tx w/u [facility]): Cor Ca2+, EF 53%, motion artifact, equiv study, likely low risk.   Nonobstructive coronary artery disease    a.  2019 Cath ([place]): Left main normal, LAD 30 proximal, RCA 30 distal, EF 25%.   Obesity    Peripheral arterial disease     Peripheral vascular disease    Type 2 diabetes mellitus (HCC)    a. 10/2023 - admission w/ HHS.    Past Surgical History:  Procedure Laterality Date   A/V FISTULAGRAM N/A 08/15/2019   Procedure: A/V FISTULAGRAM;  Surgeon: Sheree Penne Bruckner, MD;  Location: [facility] INVASIVE CV LAB;  Service: Cardiovascular;  Laterality: N/A;   A/V FISTULAGRAM N/A 01/21/2024   Procedure: A/V Fistulagram;  Surgeon: Melia Lynwood ORN, MD;  Location: [facility] INVASIVE CV LAB;  Service: Cardiovascular;  Laterality: N/A;   AMPUTATION Right 02/02/2022   Procedure: AMPUTATION 5th RAY;  Surgeon: Joya Stabs, MD;  Location: [facility] OR;  Service: Podiatry;  Laterality: Right;   AMPUTATION Right 05/23/2022   Procedure: RIGHT BELOW KNEE AMPUTATION;  Surgeon: Harden Jerona GAILS, MD;  Location: [facility] OR;  Service: Orthopedics;  Laterality: Right;   AMPUTATION Right 07/04/2022   Procedure: REVISION AMPUTATION BELOW KNEE;  Surgeon: Harden Jerona GAILS, MD;  Location: [facility] OR;  Service: Orthopedics;  Laterality: Right;   APPLICATION OF WOUND VAC  05/23/2022   Procedure: APPLICATION OF WOUND VAC;  Surgeon: Harden Jerona GAILS, MD;  Location: [facility] OR;  Service: Orthopedics;;   AV FISTULA PLACEMENT     CARDIAC CATHETERIZATION  02/17/2018   LHC 02/17/18 ([name]): 30% pLAD, 30% dRCA, LVEDP 25-30, LVEF 25%.     CATARACT EXTRACTION W/PHACO Right 03/29/2020   Procedure: CATARACT EXTRACTION PHACO AND INTRAOCULAR LENS PLACEMENT (IOC)  RIGHT DIABETIC;  Surgeon: Mittie Gaskin, MD;  Location: [facility] ORS;  Service: Ophthalmology;  Laterality: Right;  Lot # [id number] H US : 02:30.9 AP% 9.5% CDE: 14.38   EYE SURGERY Right    cataract removed   [name] N/A 12/02/2023   Procedure: [name];  Surgeon: Federico Rosario BROCKS, MD;  Location: [facility] ENDOSCOPY;  Service: Gastroenterology;  Laterality: N/A;   I & D EXTREMITY Right 02/02/2022   Procedure: IRRIGATION AND DEBRIDEMENT RIGHT FOOT;  Surgeon: Joya Stabs, MD;  Location: [facility] OR;  Service:  Podiatry;  Laterality: Right;   INCISION AND DRAINAGE OF WOUND Right 02/08/2022   Procedure: IRRIGATION AND DEBRIDEMENT WOUND;  Surgeon: Burt Fus, DPM;  Location: [facility] OR;  Service: Podiatry;  Laterality: Right;  with removal of infected bone    INSERTION OF DIALYSIS CATHETER  08/13/2019   Procedure: Insertion Of Dialysis Catheter;  Surgeon: Oris Krystal FALCON, MD;  Location: [facility] OR;  Service: Vascular;;   INSERTION OF DIALYSIS CATHETER Left 07/19/2020   Procedure: INSERTION OF DIALYSIS CATHETER;  Surgeon: Sheree Penne Bruckner, MD;  Location: [facility] OR;  Service: Vascular;  Laterality: Left;   IR FLUORO GUIDE CV LINE LEFT  02/16/2024   IR LUMBAR PUNCTURE  11/20/2023   IR US  GUIDE BX ASP/DRAIN  11/07/2022   IR US  GUIDE VASC ACCESS LEFT  02/16/2024   IRRIGATION AND DEBRIDEMENT ABSCESS     PERIPHERAL VASCULAR BALLOON ANGIOPLASTY  08/15/2019   Procedure: PERIPHERAL VASCULAR BALLOON ANGIOPLASTY;  Surgeon: Sheree Penne Bruckner, MD;  Location: [facility] INVASIVE CV LAB;  Service: Cardiovascular;;   PERIPHERAL VASCULAR BALLOON ANGIOPLASTY Left 02/06/2022   Procedure: PERIPHERAL VASCULAR BALLOON ANGIOPLASTY;  Surgeon: Gretta Bruckner PARAS, MD;  [facility] INVASIVE CV LAB;  Service: Cardiovascular;  Laterality: Left;   PERIPHERAL VASCULAR BALLOON ANGIOPLASTY  01/21/2024   Procedure: PERIPHERAL VASCULAR BALLOON ANGIOPLASTY;  Surgeon: Melia Lynwood ORN, MD;  [facility] INVASIVE CV LAB;  Service: Cardiovascular;;  Cephalic Arch   RADIOLOGY WITH ANESTHESIA N/A 11/20/2023   Procedure: MRI WITH ANESTHESIA;  Surgeon: Radiologist, Medication, MD;  Location: [facility] OR;  Service: Radiology;  Laterality: N/A;   RADIOLOGY WITH ANESTHESIA N/A 11/20/2023   Procedure: IR WITH ANESTHESIA;  Surgeon: Alona Corners, DO;  Location: [facility] OR;  Service: Anesthesiology;  Laterality: N/A;  PLEASE OBTAIN OPENING PRESSURE AND DRAIN AT LEAST 30CC'S OF CSF.   REVISON OF ARTERIOVENOUS FISTULA Left 07/19/2020   Procedure: REVISON/PLICATION OF ARTERIOVENOUS  FISTULA LEFT;  Surgeon: Sheree Penne Bruckner, MD;  Location: [facility] OR;  Service: Vascular;  Laterality: Left;   THROMBECTOMY AND REVISION OF ARTERIOVENTOUS (AV) GORETEX  GRAFT Left 08/13/2019   Procedure: THROMBECTOMY OF LEFT ARM ARTERIOVENTOUS (AV) FISTULA;  Surgeon: Oris Krystal FALCON, MD;  Location: [facility] OR;  Service: Vascular;  Laterality: Left;   TOE AMPUTATION     UPPER EXTREMITY VENOGRAPHY Bilateral 03/30/2024   Procedure: UPPER EXTREMITY VENOGRAPHY;  Surgeon: Lanis Fonda BRAVO, MD;  Location: [facility] INVASIVE CV LAB;  Service: Cardiovascular;  Laterality: Bilateral;    Family History  Problem Relation Age of Onset   Hypertension Mother     Social History   Tobacco Use   Smoking status: Never   Smokeless tobacco: Never  Vaping Use   Vaping status: Never Used  Substance Use Topics   Alcohol use: Yes   Drug use: Never    Medications: I have reviewed the patient's current medications.  Allergies  Allergen Reactions   Sulfa Antibiotics Diarrhea and Nausea And Vomiting     ROS:  Pertinent items are noted in HPI.  Blood pressure 95/60, pulse 85, temperature 98  F (36.7 C), temperature source Oral, resp. rate 17, height 6' 1 (1.854 m), weight 104.4 kg, SpO2 98%. Physical Exam Vitals reviewed.  Constitutional:      Appearance: He is well-developed.  HENT:     Head: Normocephalic and atraumatic.  Eyes:     Extraocular Movements: Extraocular movements intact.     Pupils: Pupils are equal, round, and reactive to light.  Cardiovascular:     Rate and Rhythm: Normal rate.  Pulmonary:     Effort: Pulmonary effort is normal.  Chest:     Comments: Left tunneled hemodialysis catheter in place without surrounding erythema, tenderness, edema, or drainage Abdominal:     Palpations: Abdomen is soft.     Tenderness: There is no abdominal tenderness.  Musculoskeletal:     Cervical back: Normal range of motion.  Skin:    General: Skin is warm and dry.     Comments: Multiple circular  scars across patient's chest and bilateral upper extremities  Neurological:     Mental Status: He is alert.  Psychiatric:        Mood and Affect: Mood normal.        Behavior: Behavior normal.     Results: Results for orders placed or performed during the hospital encounter of 10/24/24 (from the past 48 hours)  Troponin T, High Sensitivity     Status: Abnormal   Collection Time: 10/24/24 12:15 PM  Result Value Ref Range   Troponin T High Sensitivity 216 (HH) 0 - 19 ng/L    Comment: Critical value noted. Value is consistent with previously reported and called value  (NOTE) Biotin concentrations > 1000 ng/mL falsely decrease TnT results.  Serial cardiac troponin measurements are suggested.  Refer to the Links section for chest pain algorithms and additional  guidance. Performed at [facility], [address] [place], [place] [address]   Culture, blood (routine x 2)     Status: None (Preliminary result)   Collection Time: 10/24/24  2:07 PM   Specimen: BLOOD RIGHT FOREARM  Result Value Ref Range   Specimen Description      BLOOD RIGHT FOREARM BOTTLES DRAWN AEROBIC AND ANAEROBIC   Special Requests      Blood Culture results may not be optimal due to an inadequate volume of blood received in culture bottles   Culture  Setup Time      IN BOTH AEROBIC AND ANAEROBIC BOTTLES GRAM POSITIVE COCCI IN CLUSTERS Gram Stain Report Called to,Read Back By and Verified With: K. [facility] RN 10/25/24 @0709  BY J. WHITE Performed at [facility], [address], [place], [place] [address]    Culture GRAM POSITIVE COCCI    Report Status PENDING   Lactic acid, plasma     Status: Abnormal   Collection Time: 10/24/24  2:08 PM  Result Value Ref Range   Lactic Acid, Venous 2.1 (HH) 0.5 - 1.9 mmol/L    Comment: Critical Value, Read Back and verified with T LENARD AT 1442 ON [id number] BY S DALTON Performed at [facility], [address], [place], [place] [address]   Culture, blood (routine x 2)      Status: Abnormal (Preliminary result)   Collection Time: 10/24/24  2:51 PM   Specimen: BLOOD  Result Value Ref Range   Specimen Description      BLOOD BLOOD RIGHT ARM Performed at [facility], [address] [place], [place] [address]    Special Requests      BOTTLES DRAWN AEROBIC AND ANAEROBIC Blood  Culture adequate volume Performed at [facility], [address] [place], [place] [address]    Culture  Setup Time      IN BOTH AEROBIC AND ANAEROBIC BOTTLES GRAM POSITIVE COCCI IN CLUSTERS Gram Stain Report Called to,Read Back By and Verified With: K. [facility] RN 10/25/24 @0709  BY J. WHITE CRITICAL RESULT CALLED TO, READ BACK BY AND VERIFIED WITH: PHARMD LAURIE POOLE 10/25/24 @1034  PR    Culture (A)     STAPHYLOCOCCUS AUREUS SUSCEPTIBILITIES TO FOLLOW Performed at [facility], [address] [place], [place] [address]    Report Status PENDING   Blood Culture ID Panel (Reflexed)     Status: Abnormal   Collection Time: 10/24/24  2:51 PM  Result Value Ref Range   Enterococcus faecalis NOT DETECTED NOT DETECTED   Enterococcus Faecium NOT DETECTED NOT DETECTED   Listeria monocytogenes NOT DETECTED NOT DETECTED   Staphylococcus species DETECTED (A) NOT DETECTED    Comment: CRITICAL RESULT CALLED TO, READ BACK BY AND VERIFIED WITH: PHARMD LAURIE POOLE 10/25/24 @1034  PR    Staphylococcus aureus (BCID) DETECTED (A) NOT DETECTED    Comment: Methicillin (oxacillin)-resistant Staphylococcus aureus (MRSA). MRSA is predictably resistant to beta-lactam antibiotics (except ceftaroline). Preferred therapy is vancomycin  unless clinically contraindicated. Patient requires contact precautions if  hospitalized. CRITICAL RESULT CALLED TO, READ BACK BY AND VERIFIED WITH: PHARMD LAURIE POOLE 10/25/24 @1034  PR    Staphylococcus epidermidis NOT DETECTED NOT DETECTED   Staphylococcus lugdunensis NOT DETECTED NOT DETECTED   Streptococcus species NOT DETECTED NOT DETECTED   Streptococcus  agalactiae NOT DETECTED NOT DETECTED   Streptococcus pneumoniae NOT DETECTED NOT DETECTED   Streptococcus pyogenes NOT DETECTED NOT DETECTED   A.calcoaceticus-baumannii NOT DETECTED NOT DETECTED   Bacteroides fragilis NOT DETECTED NOT DETECTED   Enterobacterales NOT DETECTED NOT DETECTED   Enterobacter cloacae complex NOT DETECTED NOT DETECTED   Escherichia coli NOT DETECTED NOT DETECTED   Klebsiella aerogenes NOT DETECTED NOT DETECTED   Klebsiella oxytoca NOT DETECTED NOT DETECTED   Klebsiella pneumoniae NOT DETECTED NOT DETECTED   Proteus species NOT DETECTED NOT DETECTED   Salmonella species NOT DETECTED NOT DETECTED   Serratia marcescens NOT DETECTED NOT DETECTED   Haemophilus influenzae NOT DETECTED NOT DETECTED   Neisseria meningitidis NOT DETECTED NOT DETECTED   Pseudomonas aeruginosa NOT DETECTED NOT DETECTED   Stenotrophomonas maltophilia NOT DETECTED NOT DETECTED   Candida albicans NOT DETECTED NOT DETECTED   Candida auris NOT DETECTED NOT DETECTED   Candida glabrata NOT DETECTED NOT DETECTED   Candida krusei NOT DETECTED NOT DETECTED   Candida parapsilosis NOT DETECTED NOT DETECTED   Candida tropicalis NOT DETECTED NOT DETECTED   Cryptococcus neoformans/gattii NOT DETECTED NOT DETECTED   Meth resistant mecA/C and MREJ DETECTED (A) NOT DETECTED    Comment: CRITICAL RESULT CALLED TO, READ BACK BY AND VERIFIED WITH: PHARMD LAURIE POOLE 10/25/24 @1034  PR Performed at [facility], [address] [place], [place] [address]   Hepatic function panel     Status: Abnormal   Collection Time: 10/24/24  2:57 PM  Result Value Ref Range   Total Protein 7.8 6.5 - 8.1 g/dL   Albumin  3.0 (L) 3.5 - 5.0 g/dL   AST 23 15 - 41 U/L   ALT 10 0 - 44 U/L   Alkaline Phosphatase 118 38 - 126 U/L   Total Bilirubin 2.4 (H) 0.0 - 1.2 mg/dL   Bilirubin, Direct 1.7 (H) 0.0 - 0.2 mg/dL   Indirect Bilirubin  0.7 0.3 - 0.9 mg/dL    Comment: Performed at [facility], [address]  [place], [place] [address]  Lactic acid, plasma     Status: Abnormal   Collection Time: 10/24/24  4:08 PM  Result Value Ref Range   Lactic Acid, Venous 2.7 (HH) 0.5 - 1.9 mmol/L    Comment: CRITICAL VALUE NOTED.  VALUE IS CONSISTENT WITH PREVIOUSLY REPORTED AND CALLED VALUE. Performed at [facility], [address], [place], [place] [address]   Hepatitis B surface antigen     Status: None   Collection Time: 10/24/24  4:08 PM  Result Value Ref Range   Hepatitis B Surface Ag NON REACTIVE NON REACTIVE    Comment: Performed at [facility], [address] [place], [place] [address]  Hepatitis B surface antibody,quantitative     Status: None   Collection Time: 10/24/24  4:08 PM  Result Value Ref Range   Hep B S AB Quant (Post) 167.0 Immunity>10 mIU/mL    Comment: (NOTE)  Status of Immunity                     Anti-HBs Level  ------------------                     -------------- Inconsistent with Immunity                  0.0 - 10.0 Consistent with Immunity                         >10.0 Performed At: [facility] [address] [place], [place] [id number] Jennette Shorter MD Ey:[phone]   MRSA Next Gen by PCR, Nasal     Status: Abnormal   Collection Time: 10/24/24  4:33 PM   Specimen: Nasal Mucosa; Nasal Swab  Result Value Ref Range   MRSA by PCR Next Gen DETECTED (A) NOT DETECTED    Comment: RESULT CALLED TO, READ BACK BY AND VERIFIED WITH: C KINSLEY,RN@1950  1208/25 MK (NOTE) The GeneXpert MRSA Assay (FDA approved for NASAL specimens only), is one component of a comprehensive MRSA colonization surveillance program. It is not intended to diagnose MRSA infection nor to guide or monitor treatment for MRSA infections. Test performance is not FDA approved in patients less than 71 years old. Performed at [facility], [address] [place], [place] [address]   Glucose, capillary     Status: Abnormal   Collection Time: 10/24/24  9:01 PM  Result Value Ref Range    Glucose-Capillary 168 (H) 70 - 99 mg/dL    Comment: Glucose reference range applies only to samples taken after fasting for at least 8 hours.   Comment 1 Notify RN    Comment 2 Document in Chart   CBC     Status: Abnormal   Collection Time: 10/25/24  4:42 AM  Result Value Ref Range   WBC 16.1 (H) 4.0 - 10.5 K/uL   RBC 5.07 4.22 - 5.81 MIL/uL   Hemoglobin 12.6 (L) 13.0 - 17.0 g/dL   HCT 62.1 (L) 60.9 - 47.9 %   MCV 74.6 (L) 80.0 - 100.0 fL   MCH 24.9 (L) 26.0 - 34.0 pg   MCHC 33.3 30.0 - 36.0 g/dL   RDW 74.1 (H) 88.4 - 84.4 %   Platelets 185 150 - 400 K/uL   nRBC 0.0 0.0 - 0.2 %    Comment: Performed at [facility], [address],  [place], [place] [address]  Renal function panel     Status: Abnormal   Collection Time: 10/25/24  4:42 AM  Result Value Ref Range   Sodium 130 (L) 135 - 145 mmol/L    Comment: Electrolytes repeated to verify    Potassium 4.1 3.5 - 5.1 mmol/L   Chloride 88 (L) 98 - 111 mmol/L   CO2 19 (L) 22 - 32 mmol/L   Glucose, Bld 215 (H) 70 - 99 mg/dL    Comment: Glucose reference range applies only to samples taken after fasting for at least 8 hours.   BUN 63 (H) 8 - 23 mg/dL   Creatinine, Ser 88.79 (H) 0.61 - 1.24 mg/dL   Calcium  9.2 8.9 - 10.3 mg/dL   Phosphorus 8.0 (H) 2.5 - 4.6 mg/dL   Albumin  3.0 (L) 3.5 - 5.0 g/dL   GFR, Estimated 5 (L) >60 mL/min    Comment: (NOTE) Calculated using the CKD-EPI Creatinine Equation (2021)    Anion gap 23 (H) 5 - 15    Comment: Performed at [facility], [address] [place], [place] [address]  Lactic acid, plasma     Status: Abnormal   Collection Time: 10/25/24  4:42 AM  Result Value Ref Range   Lactic Acid, Venous 2.5 (HH) 0.5 - 1.9 mmol/L    Comment: Critical Value, Read Back and verified with KYM Linger [phone], Viray,J Performed at [facility], [address] [place], [place] [address]   Glucose, capillary     Status: Abnormal   Collection Time: 10/25/24  7:29 AM  Result Value Ref Range    Glucose-Capillary 223 (H) 70 - 99 mg/dL    Comment: Glucose reference range applies only to samples taken after fasting for at least 8 hours.  Glucose, capillary     Status: Abnormal   Collection Time: 10/25/24 11:06 AM  Result Value Ref Range   Glucose-Capillary 164 (H) 70 - 99 mg/dL    Comment: Glucose reference range applies only to samples taken after fasting for at least 8 hours.  Glucose, capillary     Status: Abnormal   Collection Time: 10/25/24  4:20 PM  Result Value Ref Range   Glucose-Capillary 148 (H) 70 - 99 mg/dL    Comment: Glucose reference range applies only to samples taken after fasting for at least 8 hours.  Glucose, capillary     Status: Abnormal   Collection Time: 10/25/24 11:19 PM  Result Value Ref Range   Glucose-Capillary 247 (H) 70 - 99 mg/dL    Comment: Glucose reference range applies only to samples taken after fasting for at least 8 hours.  Glucose, capillary     Status: Abnormal   Collection Time: 10/26/24  3:07 AM  Result Value Ref Range   Glucose-Capillary 219 (H) 70 - 99 mg/dL    Comment: Glucose reference range applies only to samples taken after fasting for at least 8 hours.  Glucose, capillary     Status: Abnormal   Collection Time: 10/26/24  7:50 AM  Result Value Ref Range   Glucose-Capillary 277 (H) 70 - 99 mg/dL    Comment: Glucose reference range applies only to samples taken after fasting for at least 8 hours.  Glucose, capillary     Status: Abnormal   Collection Time: 10/26/24 11:33 AM  Result Value Ref Range   Glucose-Capillary 199 (H) 70 - 99 mg/dL    Comment: Glucose reference range applies only to samples taken after fasting for at least 8 hours.  Comment 1 Notify RN    Comment 2 Document in Chart     CT Chest Wo Contrast Result Date: 10/24/2024 CLINICAL DATA:  Pneumonia. Shortness of breath after dialysis Friday. EXAM: CT CHEST WITHOUT CONTRAST TECHNIQUE: Multidetector CT imaging of the chest was performed following the standard  protocol without IV contrast. RADIATION DOSE REDUCTION: This exam was performed according to the departmental dose-optimization program which includes automated exposure control, adjustment of the mA and/or kV according to patient size and/or use of iterative reconstruction technique. COMPARISON:  11/08/2023. FINDINGS: Cardiovascular: Atherosclerotic calcification of the aorta, aortic valve and coronary arteries. Enlarged pulmonic trunk and heart. No pericardial effusion. Left IJ dialysis catheter terminates in the right atrium. Mediastinum/Nodes: No pathologically enlarged mediastinal or axillary lymph nodes. Hilar regions are difficult to definitively evaluate without IV contrast. Esophagus is grossly unremarkable. Lungs/Pleura: Image quality is degraded by expiratory phase imaging and respiratory motion. Peribronchovascular ground-glass and consolidation in the anterior segment right upper lobe and likely elsewhere within the right middle and right lower lobes. Tiny right and moderate left pleural effusion, the latter of which is partially loculated. Compressive collapse/consolidation in the left lower lobe and to a lesser extent, lingula. There may be tracheobronchomalacia. Upper Abdomen: Liver margin is slightly irregular. Bilateral renal stones and/or renal vascular calcifications. Visualized portions of the liver, gallbladder, adrenal glands, kidneys, spleen, pancreas, stomach and bowel are otherwise grossly unremarkable. No upper abdominal adenopathy. Slight nonspecific haziness in the left upper quadrant omentum. Musculoskeletal: Degenerative changes in the spine. Old sternal fracture. IMPRESSION: 1. Multilobar bronchopneumonia. 2. Tiny right and moderate partially loculated left pleural effusions. Collapse/consolidation in the left lower lobe may be due to atelectasis. Difficult to exclude pneumonia. 3. 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 4. Liver margin is slightly irregular, raising suspicion for cirrhosis.  5. Bilateral renal stones and/or renal vascular calcifications. 6. Aortic atherosclerosis (ICD10-I70.0). Coronary artery calcification. 7. Enlarged pulmonic trunk, indicative of pulmonary arterial hypertension. Electronically Signed   By: Newell Eke M.D.   On: 10/24/2024 14:15     Assessment & Plan:  [name] MILLEA Sr. is a 63 y.o. male who was admitted with multifocal pneumonia and MRSA bacteremia.  Imaging and blood work evaluated by myself.  -I explained the need for tunneled catheter removal with the patient.  After discussions with nephrology and ID, plan will be for patient to undergo dialysis on Friday and for me to remove his tunneled dialysis catheter afterwards.  He will then have a line holiday over the weekend with plans for replacement of his tunneled catheter on Monday -Discussed with both hospitalist, ID, and nephrology that the tunneled dialysis catheter will have to be placed with IR or vascular surgery, as there is no one available at [facility] to replace the catheter at the end of this week or next Monday -The risk and benefits of removal of left tunneled hemodialysis catheter were discussed including but not limited to bleeding, infection, need for additional procedures.  After careful consideration, [name] TAKESHITA Sr. has decided to proceed with procedure.  -Again, we will plan for removal on Friday, 12/12 -Remainder of care per hospitalist, ID, and nephrology  All questions were answered to the satisfaction of the patient.  Note: Portions of this report may have been transcribed using voice recognition software. Every effort has been made to ensure accuracy; however, inadvertent computerized transcription errors may still be present.   -- Dorothyann Brittle, DO [facility] [address] [place], [place] [address] [phone] (office)

## 2024-10-26 NOTE — Progress Notes (Signed)
 Patient tolerated Left Thoracentesis procedure well today and 450 mL of clear yellow pleural fluid removed with labs collected and sent to lab for processing. Patient verbalized understanding of post procedure instructions and portable chest xray ordered post procedure. Patient had no acute distress noted at completion of procedure.

## 2024-10-26 NOTE — Progress Notes (Signed)
 Virtual Visit via Video Note  I connected with [name] [name] on @TODAY @ at  by a video enabled telemedicine application and verified that I am speaking with the correct person using two identifiers.  Location: Patient: ICU [address] Provider: [facility]   I discussed the limitations of evaluation and management by telemedicine and the availability of in person appointments. The patient expressed understanding and agreed to proceed.  Subjective: No new complaints   Antibiotics:  Anti-infectives (From admission, onward)    Start     Dose/Rate Route Frequency Ordered Stop   10/25/24 1800  ceFEPIme  (MAXIPIME ) 1 g in sodium chloride  0.9 % 100 mL IVPB  Status:  Discontinued        1 g 200 mL/hr over 30 Minutes Intravenous Every 24 hours 10/24/24 1642 10/25/24 0906   10/25/24 1600  ceFEPIme  (MAXIPIME ) 2 g in sodium chloride  0.9 % 100 mL IVPB  Status:  Discontinued        2 g 200 mL/hr over 30 Minutes Intravenous Every T-Th-Sa (Hemodialysis) 10/25/24 0906 10/25/24 1323   10/25/24 1600  vancomycin  (VANCOCIN ) IVPB 1000 mg/200 mL premix        1,000 mg 200 mL/hr over 60 Minutes Intravenous Every T-Th-Sa (Hemodialysis) 10/25/24 0908     10/24/24 1800  ceFEPIme  (MAXIPIME ) 2 g in sodium chloride  0.9 % 100 mL IVPB        2 g 200 mL/hr over 30 Minutes Intravenous  Once 10/24/24 1638 10/24/24 1720   10/24/24 1730  vancomycin  (VANCOREADY) IVPB 2000 mg/400 mL        2,000 mg 200 mL/hr over 120 Minutes Intravenous  Once 10/24/24 1638 10/24/24 1926   10/24/24 1639  vancomycin  variable dose per unstable renal function (pharmacist dosing)  Status:  Discontinued         Does not apply See admin instructions 10/24/24 1639 10/26/24 0843   10/24/24 1430  cefTRIAXone  (ROCEPHIN ) 1 g in sodium chloride  0.9 % 100 mL IVPB        1 g 200 mL/hr over 30 Minutes Intravenous  Once 10/24/24 1419 10/24/24 1536   10/24/24 1430  azithromycin  (ZITHROMAX ) 500 mg in sodium chloride  0.9 % 250 mL  IVPB        500 mg 250 mL/hr over 60 Minutes Intravenous  Once 10/24/24 1419 10/24/24 1724       Medications: Scheduled Meds:  aspirin  EC  81 mg Oral Q breakfast   Chlorhexidine  Gluconate Cloth  6 each Topical Q0600   cinacalcet   60 mg Oral Q breakfast   gabapentin   300 mg Oral Daily   heparin   5,000 Units Subcutaneous Q8H   insulin  aspart  0-5 Units Subcutaneous QHS   insulin  aspart  0-9 Units Subcutaneous TID WC   midodrine   20 mg Oral TID   mupirocin  ointment  1 Application Nasal BID   sevelamer  carbonate  4,000 mg Oral TID WC   Continuous Infusions:  anticoagulant sodium citrate      norepinephrine  (LEVOPHED ) Adult infusion 3 mcg/min (10/26/24 1014)   vancomycin  Stopped (10/25/24 1907)   PRN Meds:.acetaminophen  **OR** acetaminophen , alteplase , anticoagulant sodium citrate , heparin , heparin , ipratropium-albuterol , lidocaine  (PF), lidocaine -prilocaine , ondansetron  **OR** ondansetron  (ZOFRAN ) IV, mouth rinse, pentafluoroprop-tetrafluoroeth, polyethylene glycol    Objective: Weight change: -0.827 kg  Intake/Output Summary (Last 24 hours) at 10/26/2024 1947 Last data filed at 10/26/2024 1014 Gross per 24 hour  Intake 812.19 ml  Output --  Net 812.19 ml   Blood pressure  95/60, pulse 87, temperature 98.3 F (36.8 C), temperature source Oral, resp. rate (!) 22, height 6' 1 (1.854 m), weight 104.4 kg, SpO2 94%. Temp:  [97.7 F (36.5 C)-98.6 F (37 C)] 98.3 F (36.8 C) (12/10 1700) Pulse Rate:  [84-102] 87 (12/10 1845) Resp:  [0-31] 22 (12/10 1257) SpO2:  [88 %-100 %] 94 % (12/10 1845) Arterial Line BP: (77-137)/(44-94) 101/49 (12/10 1845) Weight:  [104.4 kg] 104.4 kg (12/10 0417)  Physical Exam: Physical Exam Constitutional:      Appearance: He is well-developed.  HENT:     Head: Normocephalic and atraumatic.     Mouth/Throat:     Pharynx: Posterior oropharyngeal erythema present.  Eyes:     Extraocular Movements: Extraocular movements intact.      Conjunctiva/sclera: Conjunctivae normal.  Pulmonary:     Effort: Pulmonary effort is normal.  Abdominal:     General: There is no distension.  Skin:    General: Skin is warm and dry.  Neurological:     Mental Status: He is alert.  Psychiatric:        Mood and Affect: Mood normal.        Behavior: Behavior normal.        Thought Content: Thought content normal.        Judgment: Judgment normal.      CBC:    BMET Recent Labs    10/24/24 1112 10/25/24 0442  NA 130* 130*  K 3.7 4.1  CL 87* 88*  CO2 22 19*  GLUCOSE 105* 215*  BUN 51* 63*  CREATININE 10.50* 11.20*  CALCIUM  9.6 9.2     Liver Panel  Recent Labs    10/24/24 1457 10/25/24 0442  PROT 7.8  --   ALBUMIN  3.0* 3.0*  AST 23  --   ALT 10  --   ALKPHOS 118  --   BILITOT 2.4*  --   BILIDIR 1.7*  --   IBILI 0.7  --        Sedimentation Rate No results for input(s): ESRSEDRATE in the last 72 hours. C-Reactive Protein No results for input(s): CRP in the last 72 hours.  Micro Results: Recent Results (from the past 720 hours)  Resp panel by RT-PCR (RSV, Flu A&B, Covid) Anterior Nasal Swab     Status: None   Collection Time: 10/24/24 10:20 AM   Specimen: Anterior Nasal Swab  Result Value Ref Range Status   SARS Coronavirus 2 by RT PCR NEGATIVE NEGATIVE Final    Comment: (NOTE) SARS-CoV-2 target nucleic acids are NOT DETECTED.  The SARS-CoV-2 RNA is generally detectable in upper respiratory specimens during the acute phase of infection. The lowest concentration of SARS-CoV-2 viral copies this assay can detect is 138 copies/mL. A negative result does not preclude SARS-Cov-2 infection and should not be used as the sole basis for treatment or other patient management decisions. A negative result may occur with  improper specimen collection/handling, submission of specimen other than nasopharyngeal swab, presence of viral mutation(s) within the areas targeted by this assay, and inadequate number  of viral copies(<138 copies/mL). A negative result must be combined with clinical observations, patient history, and epidemiological information. The expected result is Negative.  Fact Sheet for Patients:  bloggercourse.com  Fact Sheet for Healthcare Providers:  seriousbroker.it  This test is no t yet approved or cleared by the [place]  FDA and  has been authorized for detection and/or diagnosis of SARS-CoV-2 by FDA under an Emergency Use Authorization (EUA). This EUA will  remain  in effect (meaning this test can be used) for the duration of the COVID-19 declaration under Section 564(b)(1) of the Act, 21 U.S.C.section 360bbb-3(b)(1), unless the authorization is terminated  or revoked sooner.       Influenza A by PCR NEGATIVE NEGATIVE Final   Influenza B by PCR NEGATIVE NEGATIVE Final    Comment: (NOTE) The Xpert Xpress SARS-CoV-2/FLU/RSV plus assay is intended as an aid in the diagnosis of influenza from Nasopharyngeal swab specimens and should not be used as a sole basis for treatment. Nasal washings and aspirates are unacceptable for Xpert Xpress SARS-CoV-2/FLU/RSV testing.  Fact Sheet for Patients: bloggercourse.com  Fact Sheet for Healthcare Providers: seriousbroker.it  This test is not yet approved or cleared by the [place]  FDA and has been authorized for detection and/or diagnosis of SARS-CoV-2 by FDA under an Emergency Use Authorization (EUA). This EUA will remain in effect (meaning this test can be used) for the duration of the COVID-19 declaration under Section 564(b)(1) of the Act, 21 U.S.C. section 360bbb-3(b)(1), unless the authorization is terminated or revoked.     Resp Syncytial Virus by PCR NEGATIVE NEGATIVE Final    Comment: (NOTE) Fact Sheet for Patients: bloggercourse.com  Fact Sheet for Healthcare  Providers: seriousbroker.it  This test is not yet approved or cleared by the [place]  FDA and has been authorized for detection and/or diagnosis of SARS-CoV-2 by FDA under an Emergency Use Authorization (EUA). This EUA will remain in effect (meaning this test can be used) for the duration of the COVID-19 declaration under Section 564(b)(1) of the Act, 21 U.S.C. section 360bbb-3(b)(1), unless the authorization is terminated or revoked.  Performed at [facility], [address] [place], [place] [address]   Culture, blood (routine x 2)     Status: Abnormal (Preliminary result)   Collection Time: 10/24/24  2:07 PM   Specimen: BLOOD RIGHT FOREARM  Result Value Ref Range Status   Specimen Description   Final    BLOOD RIGHT FOREARM BOTTLES DRAWN AEROBIC AND ANAEROBIC Performed at [facility], [address], [place], [place] [address]    Special Requests   Final    Blood Culture results may not be optimal due to an inadequate volume of blood received in culture bottles Performed at [facility], [address] [place], [place] [address]    Culture  Setup Time   Final    IN BOTH AEROBIC AND ANAEROBIC BOTTLES GRAM POSITIVE COCCI IN CLUSTERS Gram Stain Report Called to,Read Back By and Verified With: K. [facility] RN 10/25/24 @0709  BY J. WHITE Performed at [facility], [address] [place], [place] [address]    Culture (A)  Final    STAPHYLOCOCCUS AUREUS SUSCEPTIBILITIES TO FOLLOW Performed at [facility], [address] [place], [place] [address]    Report Status PENDING  Incomplete  Culture, blood (routine x 2)     Status: Abnormal (Preliminary result)   Collection Time: 10/24/24  2:51 PM   Specimen: BLOOD  Result Value Ref Range Status   Specimen Description   Final    BLOOD BLOOD RIGHT ARM Performed at [facility], [address] [place], [place] [address]    Special Requests   Final    BOTTLES DRAWN AEROBIC AND ANAEROBIC Blood Culture  adequate volume Performed at [facility], [address] [place], [place] [address]    Culture  Setup Time   Final    IN BOTH AEROBIC AND ANAEROBIC BOTTLES GRAM POSITIVE COCCI IN  CLUSTERS Gram Stain Report Called to,Read Back By and Verified With: K. [facility] RN 10/25/24 @0709  BY J. WHITE CRITICAL RESULT CALLED TO, READ BACK BY AND VERIFIED WITH: PHARMD LAURIE POOLE 10/25/24 @1034  PR    Culture (A)  Final    STAPHYLOCOCCUS AUREUS SUSCEPTIBILITIES TO FOLLOW Performed at [facility], [address] [place], [place] [address]    Report Status PENDING  Incomplete  Blood Culture ID Panel (Reflexed)     Status: Abnormal   Collection Time: 10/24/24  2:51 PM  Result Value Ref Range Status   Enterococcus faecalis NOT DETECTED NOT DETECTED Final   Enterococcus Faecium NOT DETECTED NOT DETECTED Final   Listeria monocytogenes NOT DETECTED NOT DETECTED Final   Staphylococcus species DETECTED (A) NOT DETECTED Final    Comment: CRITICAL RESULT CALLED TO, READ BACK BY AND VERIFIED WITH: PHARMD LAURIE POOLE 10/25/24 @1034  PR    Staphylococcus aureus (BCID) DETECTED (A) NOT DETECTED Final    Comment: Methicillin (oxacillin)-resistant Staphylococcus aureus (MRSA). MRSA is predictably resistant to beta-lactam antibiotics (except ceftaroline). Preferred therapy is vancomycin  unless clinically contraindicated. Patient requires contact precautions if  hospitalized. CRITICAL RESULT CALLED TO, READ BACK BY AND VERIFIED WITH: PHARMD LAURIE POOLE 10/25/24 @1034  PR    Staphylococcus epidermidis NOT DETECTED NOT DETECTED Final   Staphylococcus lugdunensis NOT DETECTED NOT DETECTED Final   Streptococcus species NOT DETECTED NOT DETECTED Final   Streptococcus agalactiae NOT DETECTED NOT DETECTED Final   Streptococcus pneumoniae NOT DETECTED NOT DETECTED Final   Streptococcus pyogenes NOT DETECTED NOT DETECTED Final   A.calcoaceticus-baumannii NOT DETECTED NOT DETECTED Final   Bacteroides fragilis NOT  DETECTED NOT DETECTED Final   Enterobacterales NOT DETECTED NOT DETECTED Final   Enterobacter cloacae complex NOT DETECTED NOT DETECTED Final   Escherichia coli NOT DETECTED NOT DETECTED Final   Klebsiella aerogenes NOT DETECTED NOT DETECTED Final   Klebsiella oxytoca NOT DETECTED NOT DETECTED Final   Klebsiella pneumoniae NOT DETECTED NOT DETECTED Final   Proteus species NOT DETECTED NOT DETECTED Final   Salmonella species NOT DETECTED NOT DETECTED Final   Serratia marcescens NOT DETECTED NOT DETECTED Final   Haemophilus influenzae NOT DETECTED NOT DETECTED Final   Neisseria meningitidis NOT DETECTED NOT DETECTED Final   Pseudomonas aeruginosa NOT DETECTED NOT DETECTED Final   Stenotrophomonas maltophilia NOT DETECTED NOT DETECTED Final   Candida albicans NOT DETECTED NOT DETECTED Final   Candida auris NOT DETECTED NOT DETECTED Final   Candida glabrata NOT DETECTED NOT DETECTED Final   Candida krusei NOT DETECTED NOT DETECTED Final   Candida parapsilosis NOT DETECTED NOT DETECTED Final   Candida tropicalis NOT DETECTED NOT DETECTED Final   Cryptococcus neoformans/gattii NOT DETECTED NOT DETECTED Final   Meth resistant mecA/C and MREJ DETECTED (A) NOT DETECTED Final    Comment: CRITICAL RESULT CALLED TO, READ BACK BY AND VERIFIED WITH: PHARMD LAURIE POOLE 10/25/24 @1034  PR Performed at [facility], [address], [place], [place] [address]   MRSA Next Gen by PCR, Nasal     Status: Abnormal   Collection Time: 10/24/24  4:33 PM   Specimen: Nasal Mucosa; Nasal Swab  Result Value Ref Range Status   MRSA by PCR Next Gen DETECTED (A) NOT DETECTED Final    Comment: RESULT CALLED TO, READ BACK BY AND VERIFIED WITH: C KINSLEY,RN@1950  1208/25 MK (NOTE) The GeneXpert MRSA Assay (FDA approved for NASAL specimens only), is one component of a comprehensive MRSA colonization surveillance program. It is not intended to  diagnose MRSA infection nor to guide or monitor treatment for MRSA  infections. Test performance is not FDA approved in patients less than 64 years old. Performed at [facility], [address], [place], [place] [address]   Gram stain     Status: None   Collection Time: 10/26/24  1:05 PM   Specimen: Pleura  Result Value Ref Range Status   Specimen Description PLEURAL LEFT  Final   Special Requests PLEURAL LEFT  Final   Gram Stain   Final    PLEURAL CYTOSPIN SMEAR NO ORGANISMS SEEN WBC PRESENT,BOTH PMN AND MONONUCLEAR Performed at [facility], [address] [place], [place] [address]    Report Status 10/26/2024 FINAL  Final    Studies/Results: US  THORACENTESIS ASP PLEURAL SPACE W/IMG GUIDE Result Date: 10/26/2024 INDICATION: Patient admitted 10/24/2024 with severe sepsis, MRSA bacteremia and new left pleural effusion. Request for diagnostic and therapeutic left thoracentesis. EXAM: ULTRASOUND GUIDED LEFT THORACENTESIS MEDICATIONS: 6 mL 1% lidocaine  COMPLICATIONS: None immediate. PROCEDURE: An ultrasound guided thoracentesis was thoroughly discussed with the patient and questions answered. The benefits, risks, alternatives and complications were also discussed. The patient understands and wishes to proceed with the procedure. Written consent was obtained. Ultrasound was performed to localize and mark an adequate pocket of fluid in the left chest. The area was then prepped and draped in the normal sterile fashion. 1% Lidocaine  was used for local anesthesia. Under ultrasound guidance a 19 gauge, 7-cm, Yueh catheter was introduced. Thoracentesis was performed. The catheter was removed and a dressing applied. FINDINGS: A total of approximately 450 mL of clear yellow fluid was removed. Samples were sent to the laboratory as requested by the clinical team. IMPRESSION: Successful ultrasound guided left thoracentesis yielding 450 mL of pleural fluid. Performed by Clotilda Hesselbach, PA-C Electronically Signed   By: Ester Sides M.D.   On: 10/26/2024 16:13   DG Chest  1 View Result Date: 10/26/2024 EXAM: 1 VIEW(S) XRAY OF THE CHEST 10/26/2024 01:55:07 PM COMPARISON: 2 days ago. CLINICAL HISTORY: [id number] Status post thoracentesis [id number] Status post thoracentesis. FINDINGS: LINES, TUBES AND DEVICES: Stable left internal jugular dialysis catheter. LUNGS AND PLEURA: New right upper lobe opacity is noted concerning for possible infiltrate. Stable left basilar atelectasis and possible small pleural effusion is noted. No pneumothorax is noted. HEART AND MEDIASTINUM: Stable cardiomegaly. BONES AND SOFT TISSUES: No acute osseous abnormality. IMPRESSION: 1. New right upper lobe opacity, concerning for possible pneumonia. 2. Stable left basilar atelectasis with possible small pleural effusion. Electronically signed by: [name] Seip MD 10/26/2024 02:38 PM EST RP Workstation: [id number]      Assessment/Plan:  INTERVAL HISTORY: he is sp thoracentesis   Principal Problem:   Multifocal pneumonia Active Problems:   ESRD on dialysis (HCC)   DM2 (diabetes mellitus, type 2) (HCC)   MRSA bacteremia   History of CVA    Hypotension   Below-knee amputation of right lower extremity (HCC)   CAD (coronary artery disease)   Type 2 diabetes mellitus with diabetic chronic kidney disease (HCC)   Chronic systolic CHF (congestive heart failure) (HCC)   Peripheral vascular disease, unspecified   Severe sepsis with septic shock   Sepsis due to methicillin resistant Staphylococcus aureus (MRSA) (HCC)    [name] [name] is a 63 y.o. male with  ESRD on HD, DM, now admitted with MRSA bacteremia, multifocal pneumonia and pleural effusion described on CT as loculated, now sp thoracentesis.  #1 MRSA bacteremia  --needs TTE and then TEE --he will need  a central line holiday --Friday tunneled HD catheter will removed post HD --catheter holiday over weekend --checck blood cultures post HD catheter removal and need art line out as well --followup blood, pleural fluid cultures though  pleural fluid is not an empyema and seems to be more of a transudate  #2 Multifocal pneumonia I believe this is all due to MRSA and could be due to septic emboli from the HD catheter or a right sided vegetation  #3 Pleural effusion sp thoracentesis appears more transudative  I personally spent a total of 50 minutes in the care of the patient today including preparing to see the patient, getting/reviewing separately obtained history, performing a medically appropriate exam/evaluation, counseling and educating, placing orders, referring and communicating with other health care professionals, documenting clinical information in the EHR, independently interpreting results, communicating results, and coordinating care.  Evaluation of the patient requires complex antimicrobial therapy evaluation, counseling , isolation needs to reduce disease transmission and risk assessment and mitigation.      LOS: 2 days   Jomarie Fleeta Rothman 10/26/2024, 7:47 PM

## 2024-10-26 NOTE — Procedures (Signed)
 PROCEDURE SUMMARY:  Successful image-guided left thoracentesis. Yielded 450 milliliters of clear yellow fluid. No loculations noted on ultrasound, fluid does not appear purulent. Patient tolerated procedure well. EBL < 1 mL No immediate complications.  Specimen was sent for labs. Post procedure CXR shows no pneumothorax.  Please see imaging section of Epic for full dictation.  Clotilda DELENA Hesselbach PA-C 10/26/2024 1:40 PM

## 2024-10-27 ENCOUNTER — Inpatient Hospital Stay (HOSPITAL_COMMUNITY)

## 2024-10-27 DIAGNOSIS — S88111D Complete traumatic amputation at level between knee and ankle, right lower leg, subsequent encounter: Secondary | ICD-10-CM

## 2024-10-27 DIAGNOSIS — J181 Lobar pneumonia, unspecified organism: Secondary | ICD-10-CM

## 2024-10-27 DIAGNOSIS — R7881 Bacteremia: Secondary | ICD-10-CM | POA: Diagnosis not present

## 2024-10-27 DIAGNOSIS — Z992 Dependence on renal dialysis: Secondary | ICD-10-CM | POA: Diagnosis not present

## 2024-10-27 DIAGNOSIS — A4102 Sepsis due to Methicillin resistant Staphylococcus aureus: Secondary | ICD-10-CM | POA: Diagnosis not present

## 2024-10-27 DIAGNOSIS — T827XXA Infection and inflammatory reaction due to other cardiac and vascular devices, implants and grafts, initial encounter: Secondary | ICD-10-CM

## 2024-10-27 LAB — GLUCOSE, CAPILLARY
Glucose-Capillary: 226 mg/dL — ABNORMAL HIGH (ref 70–99)
Glucose-Capillary: 191 mg/dL — ABNORMAL HIGH (ref 70–99)
Glucose-Capillary: 212 mg/dL — ABNORMAL HIGH (ref 70–99)
Glucose-Capillary: 191 mg/dL — ABNORMAL HIGH (ref 70–99)
Glucose-Capillary: 229 mg/dL — ABNORMAL HIGH (ref 70–99)
Glucose-Capillary: 260 mg/dL — ABNORMAL HIGH (ref 70–99)

## 2024-10-27 LAB — CULTURE, BLOOD (ROUTINE X 2)

## 2024-10-27 LAB — PATHOLOGIST SMEAR REVIEW

## 2024-10-27 MED ORDER — VANCOMYCIN VARIABLE DOSE PER UNSTABLE RENAL FUNCTION (PHARMACIST DOSING): Status: DC

## 2024-10-27 NOTE — Progress Notes (Signed)
 PROGRESS NOTE  [name] [name] FMW:[id number] DOB: 05-14-1961 DOA: 10/24/2024 PCP: [name], MD  Brief History:  63 y.o. male with medical history significant for ESRD on dialysis, atrial fibrillation, systolic CHF, hypertension, diabetes, LBBB.  Patient presented to the ED with complaints of difficulty breathing yesterday about 2 to 3 weeks ago.  He has a bad cough that has been going on for about 15 years, but has worsened from baseline.  Reported some dizziness over the past few days, present to respectable position, and intermittent.  He denies dizziness at this time.  No chest pain, no lower extremity swelling.  Reports he never smoked cigarettes.  His last dialysis was on Friday- 2/5.  He did not get dialyzed today, and has not taken his medications today which include midodrine .  He was in the ED 12/2 with reports of dysuria.  He makes very little urine.  He was started on a course of ciprofloxacin  which he started 12/3, and has been compliant.  He was also started on doxycycline  11/30 for chest infiltrate-thought more likely to be fluid related.   ED Course: Tmax 98.6.  Heart rate 66-91.  Respirate rate 16.  Blood pressure systolic initially 130s now down to 60s on my evaluation.  O2 sats greater 93% on room air.  Lactic acid 2.1.  WBC 12.9.  Sodium 130.  COVID influenza RSV negative.  Troponin 225 >> 216.  CT chest without contrast-multifocal bronchopneumonia, tiny right and moderate partially loculated left pleural effusion.  Collapse/consolidation in left lower lobe may be due to atelectasis, difficult to exclude pneumonia.  Nephrology consulted, no emergent need for dialysis.  IV ceftriaxone  and azithromycin  started.  500ml N/s bolus given.   Assessment/Plan: Severe sepsis with septic shock  - secondary to multifocal pneumonia and MRSA bacteremia - blood cultures positive for MRSA - continue IV vancomycin  - ID consultation noted - remains on IV norepinephrine  infusion  at 3 mcg/kg/min   MRSA bacteremia  - 10/24/24 blood culture = MRSA - 10/26/24--blood culture = neg - HD cath will likely need to be removed>>planned after HD on 12/12 - TTE, then TEE - pt will require line holiday on sat/sun - plan to d/c art line and HD catheter tomorrow after HD - repeat blood cultures after catheter removal   Left pleural effusion--transudate -10/26/2024--left thoracocentesis>> sent for cell count and cultures -does not appear to be empyema   Chronic hypotension  - he is normally on midodrine  TID - he now presents with sepsis physiology - he is on IV norepinephrine  now at 4 mcg/kg/min - wean off pressor as able  - midodrine  dose increased to 20 mg   ESRD on HD MWF - pt had inpatient HD 12/9 - consultation from nephrologist   Chronic HFrEF - volume management mostly with HD - makes small amount of urine - compensated   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  Objective: Vitals:   10/27/24 1530 10/27/24 1545 10/27/24 1548 10/27/24 1549  BP:      Pulse:      Resp: (!) 22 19 (!) 23 (!) 22  Temp:      TempSrc:      SpO2:      Weight:      Height:        Intake/Output Summary (Last 24 hours) at 10/27/2024 1714 Last data filed at 10/27/2024 9096 Gross per 24 hour  Intake 476.49 ml  Output --  Net 476.49 ml   Weight change:  Exam:  General:  Pt is  alert, follows commands appropriately, not in acute distress HEENT: No icterus, No thrush, No neck mass, [place]/AT Cardiovascular: RRR, S1/S2, no rubs, no gallops Respiratory: bibasilar crackles. No wheeze Abdomen: Soft/+BS, non tender, non distended, no guarding Extremities: No edema, No lymphangitis, No petechiae, No rashes, no synovitis; R_BKA site without erythema or drainage   Data Reviewed: I have personally reviewed following labs and imaging studies Basic Metabolic Panel: Recent Labs  Lab 10/24/24 1112 10/25/24 0442  NA 130* 130*  K 3.7 4.1  CL 87* 88*  CO2 22 19*  GLUCOSE 105* 215*  BUN 51* 63*  CREATININE 10.50* 11.20*  CALCIUM  9.6 9.2  PHOS  --  8.0*   Liver Function Tests: Recent Labs  Lab 10/24/24 1457 10/25/24 0442  AST 23  --   ALT 10  --   ALKPHOS 118  --   BILITOT 2.4*  --   PROT 7.8  --   ALBUMIN  3.0* 3.0*   No results for input(s): LIPASE, AMYLASE in the last 168 hours. No results for input(s): AMMONIA in the last 168 hours. Coagulation Profile: No results for input(s): INR, PROTIME in the last 168 hours. CBC: Recent Labs  Lab 10/24/24 1014 10/25/24 0442  WBC 10.9* 16.1*  NEUTROABS 8.0*  --   HGB 14.0 12.6*  HCT 43.1 37.8*  MCV 75.3* 74.6*  PLT 146* 185   Cardiac Enzymes: No results for input(s): CKTOTAL, CKMB, CKMBINDEX, TROPONINI in the last 168 hours. BNP: Invalid input(s): POCBNP CBG: Recent Labs  Lab 10/26/24 2201 10/27/24 0226 10/27/24 0742 10/27/24 1110 10/27/24 1631  GLUCAP 226* 191* 212* 191* 229*   HbA1C: No results for input(s): HGBA1C in the last 72 hours. Urine analysis:    Component Value Date/Time   COLORURINE AMBER (A) 07/01/2022 1143   APPEARANCEUR CLEAR 07/01/2022 1143   LABSPEC 1.017 07/01/2022 1143   PHURINE 7.0 07/01/2022 1143   GLUCOSEU NEGATIVE 07/01/2022 1143   HGBUR NEGATIVE 07/01/2022 1143   BILIRUBINUR NEGATIVE 07/01/2022 1143   KETONESUR NEGATIVE 07/01/2022 1143   PROTEINUR  >=300 (A) 07/01/2022 1143   NITRITE NEGATIVE 07/01/2022 1143   LEUKOCYTESUR NEGATIVE 07/01/2022 1143   Sepsis Labs: @LABRCNTIP (procalcitonin:4,lacticidven:4) ) Recent Results (from the past 240 hours)  Resp panel by RT-PCR (RSV, Flu A&B, Covid) Anterior Nasal Swab     Status: None   Collection Time: 10/24/24 10:20 AM   Specimen: Anterior Nasal Swab  Result Value Ref Range Status   SARS Coronavirus 2 by RT PCR NEGATIVE NEGATIVE Final    Comment: (NOTE) SARS-CoV-2 target nucleic acids are NOT DETECTED.  The SARS-CoV-2 RNA is generally detectable in upper respiratory specimens during the acute phase of infection. The lowest concentration of SARS-CoV-2 viral copies this assay can detect is 138 copies/mL. A  negative result does not preclude SARS-Cov-2 infection and should not be used as the sole basis for treatment or other patient management decisions. A negative result may occur with  improper specimen collection/handling, submission of specimen other than nasopharyngeal swab, presence of viral mutation(s) within the areas targeted by this assay, and inadequate number of viral copies(<138 copies/mL). A negative result must be combined with clinical observations, patient history, and epidemiological information. The expected result is Negative.  Fact Sheet for Patients:  bloggercourse.com  Fact Sheet for Healthcare Providers:  seriousbroker.it  This test is no t yet approved or cleared by the [place]  FDA and  has been authorized for detection and/or diagnosis of SARS-CoV-2 by FDA under an Emergency Use Authorization (EUA). This EUA will remain  in effect (meaning this test can be used) for the duration of the COVID-19 declaration under Section 564(b)(1) of the Act, 21 U.S.C.section 360bbb-3(b)(1), unless the authorization is terminated  or revoked sooner.       Influenza A by PCR NEGATIVE NEGATIVE Final   Influenza B by  PCR NEGATIVE NEGATIVE Final    Comment: (NOTE) The Xpert Xpress SARS-CoV-2/FLU/RSV plus assay is intended as an aid in the diagnosis of influenza from Nasopharyngeal swab specimens and should not be used as a sole basis for treatment. Nasal washings and aspirates are unacceptable for Xpert Xpress SARS-CoV-2/FLU/RSV testing.  Fact Sheet for Patients: bloggercourse.com  Fact Sheet for Healthcare Providers: seriousbroker.it  This test is not yet approved or cleared by the [place]  FDA and has been authorized for detection and/or diagnosis of SARS-CoV-2 by FDA under an Emergency Use Authorization (EUA). This EUA will remain in effect (meaning this test can be used) for the duration of the COVID-19 declaration under Section 564(b)(1) of the Act, 21 U.S.C. section 360bbb-3(b)(1), unless the authorization is terminated or revoked.     Resp Syncytial Virus by PCR NEGATIVE NEGATIVE Final    Comment: (NOTE) Fact Sheet for Patients: bloggercourse.com  Fact Sheet for Healthcare Providers: seriousbroker.it  This test is not yet approved or cleared by the [place]  FDA and has been authorized for detection and/or diagnosis of SARS-CoV-2 by FDA under an Emergency Use Authorization (EUA). This EUA will remain in effect (meaning this test can be used) for the duration of the COVID-19 declaration under Section 564(b)(1) of the Act, 21 U.S.C. section 360bbb-3(b)(1), unless the authorization is terminated or revoked.  Performed at [facility], [address], [place], [place] [address]   Culture, blood (routine x 2)     Status: Abnormal   Collection Time: 10/24/24  2:07 PM   Specimen: BLOOD RIGHT FOREARM  Result Value Ref Range Status   Specimen Description   Final    BLOOD RIGHT FOREARM BOTTLES DRAWN AEROBIC AND ANAEROBIC Performed at [facility] [address], [place], [place]  [address]    Special Requests   Final    Blood Culture results may not be optimal due to an inadequate volume of blood received in culture bottles Performed at [facility], [address], [place], [place] [address]    Culture  Setup Time   Final    IN BOTH AEROBIC AND ANAEROBIC BOTTLES GRAM POSITIVE COCCI IN CLUSTERS Gram Stain Report Called to,Read Back By and Verified With: K. [facility] RN 10/25/24 @0709  BY J. WHITE Performed at [facility], [address], [place], [place] [address]    Culture (A)  Final    STAPHYLOCOCCUS AUREUS SUSCEPTIBILITIES PERFORMED ON PREVIOUS CULTURE WITHIN THE LAST  5 DAYS. Performed at [facility], [address], [place], [place] [address]    Report Status 10/27/2024 FINAL  Final  Culture, blood (routine x 2)     Status: Abnormal (Preliminary result)   Collection Time: 10/24/24  2:51 PM   Specimen: BLOOD  Result Value Ref Range Status   Specimen Description   Final    BLOOD BLOOD RIGHT ARM Performed at [facility], [address], [place], [place] [address]    Special Requests   Final    BOTTLES DRAWN AEROBIC AND ANAEROBIC Blood Culture adequate volume Performed at [facility], [address], [place], [place] [address]    Culture  Setup Time   Final    IN BOTH AEROBIC AND ANAEROBIC BOTTLES GRAM POSITIVE COCCI IN CLUSTERS Gram Stain Report Called to,Read Back By and Verified With: K. [facility] RN 10/25/24 @0709  BY J. WHITE CRITICAL RESULT CALLED TO, READ BACK BY AND VERIFIED WITH: PHARMD LAURIE POOLE 10/25/24 @1034  PR    Culture (A)  Final    METHICILLIN RESISTANT STAPHYLOCOCCUS AUREUS Sent to [facility] for further susceptibility testing. Performed at [facility], [address] [place], [place] [address]    Report Status PENDING  Incomplete   Organism ID, Bacteria METHICILLIN RESISTANT STAPHYLOCOCCUS AUREUS  Final      Susceptibility   Methicillin resistant staphylococcus aureus - MIC*    CIPROFLOXACIN  >=8 RESISTANT Resistant      ERYTHROMYCIN >=8 RESISTANT Resistant     GENTAMICIN <=0.5 SENSITIVE Sensitive     OXACILLIN >=4 RESISTANT Resistant     TETRACYCLINE >=16 RESISTANT Resistant     VANCOMYCIN  1 SENSITIVE Sensitive     TRIMETH/SULFA <=10 SENSITIVE Sensitive     CLINDAMYCIN >=8 RESISTANT Resistant     RIFAMPIN <=0.5 SENSITIVE Sensitive     Inducible Clindamycin NEGATIVE Sensitive     LINEZOLID 2 SENSITIVE Sensitive     * METHICILLIN RESISTANT STAPHYLOCOCCUS AUREUS  Blood Culture ID Panel (Reflexed)     Status: Abnormal   Collection Time: 10/24/24  2:51 PM  Result Value Ref Range Status   Enterococcus faecalis NOT DETECTED NOT DETECTED Final   Enterococcus Faecium NOT DETECTED NOT DETECTED Final   Listeria monocytogenes NOT DETECTED NOT DETECTED Final   Staphylococcus species DETECTED (A) NOT DETECTED Final    Comment: CRITICAL RESULT CALLED TO, READ BACK BY AND VERIFIED WITH: PHARMD LAURIE POOLE 10/25/24 @1034  PR    Staphylococcus aureus (BCID) DETECTED (A) NOT DETECTED Final    Comment: Methicillin (oxacillin)-resistant Staphylococcus aureus (MRSA). MRSA is predictably resistant to beta-lactam antibiotics (except ceftaroline). Preferred therapy is vancomycin  unless clinically contraindicated. Patient requires contact precautions if  hospitalized. CRITICAL RESULT CALLED TO, READ BACK BY AND VERIFIED WITH: PHARMD LAURIE POOLE 10/25/24 @1034  PR    Staphylococcus epidermidis NOT DETECTED NOT DETECTED Final   Staphylococcus lugdunensis NOT DETECTED NOT DETECTED Final   Streptococcus species NOT DETECTED NOT DETECTED Final   Streptococcus agalactiae NOT DETECTED NOT DETECTED Final   Streptococcus pneumoniae NOT DETECTED NOT DETECTED Final   Streptococcus pyogenes NOT DETECTED NOT DETECTED Final   A.calcoaceticus-baumannii NOT DETECTED NOT DETECTED Final   Bacteroides fragilis NOT DETECTED NOT DETECTED Final   Enterobacterales NOT DETECTED NOT DETECTED Final   Enterobacter cloacae complex NOT DETECTED NOT  DETECTED Final   Escherichia coli NOT DETECTED NOT DETECTED Final   Klebsiella aerogenes NOT DETECTED NOT DETECTED Final   Klebsiella oxytoca NOT DETECTED NOT DETECTED Final   Klebsiella pneumoniae NOT DETECTED NOT  DETECTED Final   Proteus species NOT DETECTED NOT DETECTED Final   Salmonella species NOT DETECTED NOT DETECTED Final   Serratia marcescens NOT DETECTED NOT DETECTED Final   Haemophilus influenzae NOT DETECTED NOT DETECTED Final   Neisseria meningitidis NOT DETECTED NOT DETECTED Final   Pseudomonas aeruginosa NOT DETECTED NOT DETECTED Final   Stenotrophomonas maltophilia NOT DETECTED NOT DETECTED Final   Candida albicans NOT DETECTED NOT DETECTED Final   Candida auris NOT DETECTED NOT DETECTED Final   Candida glabrata NOT DETECTED NOT DETECTED Final   Candida krusei NOT DETECTED NOT DETECTED Final   Candida parapsilosis NOT DETECTED NOT DETECTED Final   Candida tropicalis NOT DETECTED NOT DETECTED Final   Cryptococcus neoformans/gattii NOT DETECTED NOT DETECTED Final   Meth resistant mecA/C and MREJ DETECTED (A) NOT DETECTED Final    Comment: CRITICAL RESULT CALLED TO, READ BACK BY AND VERIFIED WITH: PHARMD LAURIE POOLE 10/25/24 @1034  PR Performed at [facility], [address] [place], [place] [address]   MRSA Next Gen by PCR, Nasal     Status: Abnormal   Collection Time: 10/24/24  4:33 PM   Specimen: Nasal Mucosa; Nasal Swab  Result Value Ref Range Status   MRSA by PCR Next Gen DETECTED (A) NOT DETECTED Final    Comment: RESULT CALLED TO, READ BACK BY AND VERIFIED WITH: C KINSLEY,RN@1950  1208/25 MK (NOTE) The GeneXpert MRSA Assay (FDA approved for NASAL specimens only), is one component of a comprehensive MRSA colonization surveillance program. It is not intended to diagnose MRSA infection nor to guide or monitor treatment for MRSA infections. Test performance is not FDA approved in patients less than 90 years old. Performed at [facility], [address] [place], [place] [address]   Culture, blood (Routine X 2) w Reflex to ID Panel     Status: None (Preliminary result)   Collection Time: 10/26/24  7:10 AM   Specimen: BLOOD  Result Value Ref Range Status   Specimen Description BLOOD RT ALINE  Final   Special Requests   Final    BOTTLES DRAWN AEROBIC ONLY Blood Culture results may not be optimal due to an inadequate volume of blood received in culture bottles   Culture   Final    NO GROWTH < 24 HOURS Performed at [facility], [address], [place], [place] [address]    Report Status PENDING  Incomplete  Culture, blood (Routine X 2) w Reflex to ID Panel     Status: None (Preliminary result)   Collection Time: 10/26/24  7:10 AM   Specimen: BLOOD  Result Value Ref Range Status   Specimen Description BLOOD BLOOD RIGHT ARM  Final   Special Requests   Final    BOTTLES DRAWN AEROBIC AND ANAEROBIC Blood Culture adequate volume   Culture   Final    NO GROWTH < 24 HOURS Performed at [facility], [address], [place], [place] [address]    Report Status PENDING  Incomplete  Gram stain     Status: None   Collection Time: 10/26/24  1:05 PM   Specimen: Pleura  Result Value Ref Range Status   Specimen Description PLEURAL LEFT  Final   Special Requests PLEURAL LEFT  Final   Gram Stain   Final    PLEURAL CYTOSPIN SMEAR NO ORGANISMS SEEN WBC PRESENT,BOTH PMN AND MONONUCLEAR Performed at [facility], [address] [place], [place] [address]    Report Status 10/26/2024 FINAL  Final  Culture, body fluid w  Gram Stain-bottle     Status: None (Preliminary result)   Collection Time: 10/26/24  1:05 PM   Specimen: Pleura  Result Value Ref Range Status   Specimen Description PLEURAL  Final   Special Requests   Final    BOTTLES DRAWN AEROBIC AND ANAEROBIC Blood Culture adequate volume   Culture   Final    NO GROWTH < 24 HOURS Performed at [facility], [address], [place], [place] [address]    Report Status PENDING  Incomplete      Scheduled Meds:  aspirin  EC  81 mg Oral Q breakfast   Chlorhexidine  Gluconate Cloth  6 each Topical Q0600   cinacalcet   60 mg Oral Q breakfast   gabapentin   300 mg Oral Daily   heparin   5,000 Units Subcutaneous Q8H   insulin  aspart  0-5 Units Subcutaneous QHS   insulin  aspart  0-9 Units Subcutaneous TID WC   midodrine   20 mg Oral TID   mupirocin  ointment  1 Application Nasal BID   sevelamer  carbonate  4,000 mg Oral TID WC   Continuous Infusions:  anticoagulant sodium citrate      norepinephrine  (LEVOPHED ) Adult infusion 4 mcg/min (10/27/24 1039)   vancomycin  Stopped (10/25/24 1907)    Procedures/Studies: US  THORACENTESIS ASP PLEURAL SPACE W/IMG GUIDE Result Date: 10/26/2024 INDICATION: Patient admitted 10/24/2024 with severe sepsis, MRSA bacteremia and new left pleural effusion. Request for diagnostic and therapeutic left thoracentesis. EXAM: ULTRASOUND GUIDED LEFT THORACENTESIS MEDICATIONS: 6 mL 1% lidocaine  COMPLICATIONS: None immediate. PROCEDURE: An ultrasound guided thoracentesis was thoroughly discussed with the patient and questions answered. The benefits, risks, alternatives and complications were also discussed. The patient understands and wishes to proceed with the procedure. Written consent was obtained. Ultrasound was performed to localize and mark an adequate pocket of fluid in the left chest. The area was then prepped and draped in the normal sterile fashion. 1% Lidocaine  was used for local anesthesia. Under ultrasound guidance a 19 gauge, 7-cm, Yueh catheter was introduced. Thoracentesis was performed. The catheter was removed and a dressing applied. FINDINGS: A total of approximately 450 mL of clear yellow fluid was removed. Samples were sent to the laboratory as requested by the clinical team. IMPRESSION: Successful ultrasound guided left thoracentesis yielding 450 mL of pleural fluid. Performed by Clotilda Hesselbach, PA-C Electronically Signed   By: Ester Sides M.D.   On:  10/26/2024 16:13   DG Chest 1 View Result Date: 10/26/2024 EXAM: 1 VIEW(S) XRAY OF THE CHEST 10/26/2024 01:55:07 PM COMPARISON: 2 days ago. CLINICAL HISTORY: [id number] Status post thoracentesis [id number] Status post thoracentesis. FINDINGS: LINES, TUBES AND DEVICES: Stable left internal jugular dialysis catheter. LUNGS AND PLEURA: New right upper lobe opacity is noted concerning for possible infiltrate. Stable left basilar atelectasis and possible small pleural effusion is noted. No pneumothorax is noted. HEART AND MEDIASTINUM: Stable cardiomegaly. BONES AND SOFT TISSUES: No acute osseous abnormality. IMPRESSION: 1. New right upper lobe opacity, concerning for possible pneumonia. 2. Stable left basilar atelectasis with possible small pleural effusion. Electronically signed by: [name] Seip MD 10/26/2024 02:38 PM EST RP Workstation: [id number]   CT Chest Wo Contrast Result Date: 10/24/2024 CLINICAL DATA:  Pneumonia. Shortness of breath after dialysis Friday. EXAM: CT CHEST WITHOUT CONTRAST TECHNIQUE: Multidetector CT imaging of the chest was performed following the standard protocol without IV contrast. RADIATION DOSE REDUCTION: This exam was performed according to the departmental dose-optimization program which includes automated exposure control, adjustment of the mA and/or kV according to patient size and/or use of  iterative reconstruction technique. COMPARISON:  11/08/2023. FINDINGS: Cardiovascular: Atherosclerotic calcification of the aorta, aortic valve and coronary arteries. Enlarged pulmonic trunk and heart. No pericardial effusion. Left IJ dialysis catheter terminates in the right atrium. Mediastinum/Nodes: No pathologically enlarged mediastinal or axillary lymph nodes. Hilar regions are difficult to definitively evaluate without IV contrast. Esophagus is grossly unremarkable. Lungs/Pleura: Image quality is degraded by expiratory phase imaging and respiratory motion. Peribronchovascular ground-glass and  consolidation in the anterior segment right upper lobe and likely elsewhere within the right middle and right lower lobes. Tiny right and moderate left pleural effusion, the latter of which is partially loculated. Compressive collapse/consolidation in the left lower lobe and to a lesser extent, lingula. There may be tracheobronchomalacia. Upper Abdomen: Liver margin is slightly irregular. Bilateral renal stones and/or renal vascular calcifications. Visualized portions of the liver, gallbladder, adrenal glands, kidneys, spleen, pancreas, stomach and bowel are otherwise grossly unremarkable. No upper abdominal adenopathy. Slight nonspecific haziness in the left upper quadrant omentum. Musculoskeletal: Degenerative changes in the spine. Old sternal fracture. IMPRESSION: 1. Multilobar bronchopneumonia. 2. Tiny right and moderate partially loculated left pleural effusions. Collapse/consolidation in the left lower lobe may be due to atelectasis. Difficult to exclude pneumonia. 3. 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 4. Liver margin is slightly irregular, raising suspicion for cirrhosis. 5. Bilateral renal stones and/or renal vascular calcifications. 6. Aortic atherosclerosis (ICD10-I70.0). Coronary artery calcification. 7. Enlarged pulmonic trunk, indicative of pulmonary arterial hypertension. Electronically Signed   By: Newell Eke M.D.   On: 10/24/2024 14:15   DG Chest Port 1 View Result Date: 10/24/2024 CLINICAL DATA:  Shortness of breath. EXAM: PORTABLE CHEST 1 VIEW COMPARISON:  10/16/2024 FINDINGS: Low volume film. The cardio pericardial silhouette is enlarged. Retrocardiac collapse/consolidation with effusion is similar to prior. No pulmonary edema. Left IJ central line tip overlies the mid SVC level. IMPRESSION: Low volume film with persistent retrocardiac collapse/consolidation and effusion. Electronically Signed   By: Camellia Candle M.D.   On: 10/24/2024 11:31   DG Chest Port 1 View Result Date:  10/16/2024 EXAM: 1 VIEW(S) XRAY OF THE CHEST 10/16/2024 12:11:00 PM COMPARISON: 01/13/2024 CLINICAL HISTORY: sob FINDINGS: LINES, TUBES AND DEVICES: Tunneled left IJ hemodialysis catheter with tip at superior cavoatrial junction. LUNGS AND PLEURA: Low lung volumes. New left retrocardiac airspace opacity. Possible small left pleural effusion. No pneumothorax. HEART AND MEDIASTINUM: The cardiac silhouette is unremarkable. Aortic atherosclerosis is present. BONES AND SOFT TISSUES: No acute osseous abnormality. IMPRESSION: 1. New left retrocardiac airspace opacity and possible small left pleural effusion. 2. Low lung volumes. Electronically signed by: Evalene Coho MD 10/16/2024 12:16 PM EST RP Workstation: [email or web address] Alm Schneider, DO  [facility]  If 7PM-7AM, please contact night-coverage [email or web address] Password [id number] 10/27/2024, 5:14 PM   LOS: 3 days

## 2024-10-27 NOTE — Progress Notes (Addendum)
*  PRELIMINARY RESULTS* Echocardiogram 2-D Echocardiogram has been performed.  [name] [name] 10/27/2024, 5:22 PM

## 2024-10-27 NOTE — Progress Notes (Signed)
 Pt receives out-pt HD at [facility] 1020 am chair time, MWF. Will continue to assist as needed.   Lavanda Eulalah Rupert Dialysis Navigator [phone]

## 2024-10-27 NOTE — Progress Notes (Signed)
 [name] [name] is an 63 y.o. male HTN, T2DM, S/P rt BKA, nonobstructive coronary artery disease, HFrEF, obesity and end-stage renal disease on hemodialysis admitted with concerns of increasing difficulty with SOB for 2 to 3 weeks with recently worsening chronic cough.  He recently was started on doxycycline  on 11/30 for possible LRTI and then on ciprofloxacin  on 12/3 for urinary tract infection. Subjective fevers at home with associated chills, CT scan of the chest showed multifocal bronchopneumonia with small right and moderate loculated left pleural effusion with some atelectasis.  He was started on CAP coverage with vancomycin  and cefepime .  Increasingly hypotensive prompting need to initiate Levophed .  He also has positive blood cultures (4 out of 4) for gram-positive cocci which is very concerning in the setting of an indwelling left IJ TDC.  Dialysis prescription: [facility], MWF, 180 dialyzer, 4 hours 15 minutes, BFR 400/DFR 800, EDW 101.9 kg, 2K/2.0 calcium , UF profile #4, heparin  6000 unit bolus.  Left IJ TDC.  [name] [name]  5 mcg IV q. HD.   On Sensipar  60 mg daily, midodrine  20 mg 3 times daily with option to take extra 20 mg mid to dialysis, Renvela  4000 mg 3 times daily AC.  Assessment/Plan: 1.  Multifocal pneumonia: Negative testing for RSV infection and COVID and started on presumptive treatment for pneumonia based on CT scan findings; on vancomycin  and cefepime  -> Vanc.  Concern raised with 4 out of 4 positive blood cultures with gram-positive cocci.  Awaiting identification/sensitivities; on Vanc, ID onboard as well. 2.  End-stage renal disease: Typically on MWF dialysis schedule and dialysis postponed to Tues (1L net UF). Attempting to wean down Levophed ; his typical dose of midodrine  20 mg 3 times daily.  He does not appear to be hypervolemic on exam or upon review of his imaging but is 2kg up on EDW.    Per ID recs needs at least 48hrs line free, currently still has IV's on  right, art line, LIJ TC. Appreciate Dr. Evonnie agreeing to remove the [facility] tomorrow,  then will need TC Monday. If Dr. Kallie is not here Monday then we need to request VIR to place on Monday.  Discussed with the dialysis nurse and she will come in a little bit early Friday and has the patient on for first shift.  3.  Hypotension: Acute on chronic with evidence of sepsis in the setting of infection/bacteremia.  Resume oral midodrine  dose 20 mg 3 times a day and weaning down on Levophed  drip (still on 4mcg). 4.  Anemia: Hemoglobin and hematocrit currently at acceptable range, no indication for ESA. 5.  Secondary hyperparathyroidism: Calcium  level acceptable with significant hyperphosphatemia-resume sevelamer  for phosphorus binding and Sensipar  for PTH control.  Hold [name] [name]  for now.  Subjective: Denies cramping with HD Tues; also denies fever, chills, nausea, myalgias.  Denies any shortness of breath, chest pain.   Chemistry and CBC: Creatinine, Ser  Date/Time Value Ref Range Status  10/25/2024 04:42 AM 11.20 (H) 0.61 - 1.24 mg/dL Final  87/91/7974 88:87 AM 10.50 (H) 0.61 - 1.24 mg/dL Final  94/85/7974 92:42 AM 10.60 (H) 0.61 - 1.24 mg/dL Final  95/97/7974 95:69 AM 9.18 (H) 0.61 - 1.24 mg/dL Final  96/68/7974 91:94 AM 10.56 (H) 0.61 - 1.24 mg/dL Final  96/70/7974 93:43 AM 11.53 (H) 0.61 - 1.24 mg/dL Final  96/71/7974 93:73 AM 9.60 (H) 0.61 - 1.24 mg/dL Final  96/72/7974 97:63 PM 9.30 (H) 0.61 - 1.24 mg/dL Final  96/72/7974 97:72 PM 8.70 (H) 0.61 -  1.24 mg/dL Final  97/71/7974 94:70 PM 5.71 (H) 0.61 - 1.24 mg/dL Final  97/73/7974 95:44 AM 8.83 (H) 0.61 - 1.24 mg/dL Final  97/74/7974 87:49 PM 7.76 (H) 0.61 - 1.24 mg/dL Final  98/72/7974 92:60 AM 10.57 (H) 0.61 - 1.24 mg/dL Final  98/73/7974 87:89 PM 9.03 (H) 0.61 - 1.24 mg/dL Final  98/75/7974 97:43 AM 8.80 (H) 0.61 - 1.24 mg/dL Final  98/76/7974 96:98 AM 7.02 (H) 0.61 - 1.24 mg/dL Final  98/77/7974 97:46 AM 9.60 (H) 0.61 - 1.24  mg/dL Final  98/78/7974 97:47 AM 8.06 (H) 0.61 - 1.24 mg/dL Final  98/79/7974 97:48 AM 11.71 (H) 0.61 - 1.24 mg/dL Final  98/80/7974 97:75 AM 10.01 (H) 0.61 - 1.24 mg/dL Final  98/81/7974 97:53 AM 8.06 (H) 0.61 - 1.24 mg/dL Final  98/82/7974 96:95 AM 9.49 (H) 0.61 - 1.24 mg/dL Final  98/83/7974 97:53 AM 7.81 (H) 0.61 - 1.24 mg/dL Final  98/84/7974 88:55 AM 11.57 (H) 0.61 - 1.24 mg/dL Final  98/86/7974 97:50 AM 11.83 (H) 0.61 - 1.24 mg/dL Final  98/91/7974 97:42 AM 11.32 (H) 0.61 - 1.24 mg/dL Final  98/93/7974 91:59 AM 13.17 (H) 0.61 - 1.24 mg/dL Final  98/94/7974 89:55 AM 11.72 (H) 0.61 - 1.24 mg/dL Final  98/96/7974 91:85 AM 13.22 (H) 0.61 - 1.24 mg/dL Final  98/98/7974 96:84 PM 14.18 (H) 0.61 - 1.24 mg/dL Final  87/68/7975 97:54 AM 11.15 (H) 0.61 - 1.24 mg/dL Final  87/69/7975 95:97 AM 9.70 (H) 0.61 - 1.24 mg/dL Final  87/70/7975 96:83 AM 7.77 (H) 0.61 - 1.24 mg/dL Final  87/71/7975 96:67 AM 9.31 (H) 0.61 - 1.24 mg/dL Final  87/72/7975 91:83 AM 7.21 (H) 0.61 - 1.24 mg/dL Final  87/73/7975 91:49 AM 8.39 (H) 0.61 - 1.24 mg/dL Final  87/74/7975 95:52 PM 11.53 (H) 0.61 - 1.24 mg/dL Final  87/74/7975 95:92 AM 10.09 (H) 0.61 - 1.24 mg/dL Final  87/75/7975 96:96 PM 8.80 (H) 0.61 - 1.24 mg/dL Final  87/75/7975 96:40 AM 11.47 (H) 0.61 - 1.24 mg/dL Final  87/76/7975 92:54 PM 10.49 (H) 0.61 - 1.24 mg/dL Final  87/77/7975 95:41 PM 7.50 (H) 0.61 - 1.24 mg/dL Final  87/89/7975 97:91 AM 8.37 (H) 0.61 - 1.24 mg/dL Final  87/90/7975 97:85 AM 10.98 (H) 0.61 - 1.24 mg/dL Final  87/91/7975 96:99 PM 10.15 (H) 0.61 - 1.24 mg/dL Final  87/92/7975 95:44 PM 7.99 (H) 0.61 - 1.24 mg/dL Final  87/92/7975 93:91 AM 7.95 (H) 0.61 - 1.24 mg/dL Final  87/92/7975 97:84 AM 7.87 (H) 0.61 - 1.24 mg/dL Final  87/92/7975 98:64 AM 8.10 (H) 0.61 - 1.24 mg/dL Final  87/93/7975 94:57 PM 6.75 (H) 0.61 - 1.24 mg/dL Final  87/93/7975 87:90 AM 10.30 (H) 0.61 - 1.24 mg/dL Final  87/76/7976 91:73 AM 9.74 (H) 0.61 - 1.24 mg/dL  Final   Recent Labs  Lab 10/24/24 1112 10/25/24 0442  NA 130* 130*  K 3.7 4.1  CL 87* 88*  CO2 22 19*  GLUCOSE 105* 215*  BUN 51* 63*  CREATININE 10.50* 11.20*  CALCIUM  9.6 9.2  PHOS  --  8.0*   Recent Labs  Lab 10/24/24 1014 10/25/24 0442  WBC 10.9* 16.1*  NEUTROABS 8.0*  --   HGB 14.0 12.6*  HCT 43.1 37.8*  MCV 75.3* 74.6*  PLT 146* 185   Liver Function Tests: Recent Labs  Lab 10/24/24 1457 10/25/24 0442  AST 23  --   ALT 10  --   ALKPHOS 118  --   BILITOT 2.4*  --  PROT 7.8  --   ALBUMIN  3.0* 3.0*   No results for input(s): LIPASE, AMYLASE in the last 168 hours. No results for input(s): AMMONIA in the last 168 hours. Cardiac Enzymes: No results for input(s): CKTOTAL, CKMB, CKMBINDEX, TROPONINI in the last 168 hours. Iron  Studies: No results for input(s): IRON , TIBC, TRANSFERRIN, FERRITIN in the last 72 hours. PT/INR: @LABRCNTIP (inr:5)  Xrays/Other Studies: ) Results for orders placed or performed during the hospital encounter of 10/24/24 (from the past 48 hours)  Glucose, capillary     Status: Abnormal   Collection Time: 10/25/24 11:06 AM  Result Value Ref Range   Glucose-Capillary 164 (H) 70 - 99 mg/dL    Comment: Glucose reference range applies only to samples taken after fasting for at least 8 hours.  Glucose, capillary     Status: Abnormal   Collection Time: 10/25/24  4:20 PM  Result Value Ref Range   Glucose-Capillary 148 (H) 70 - 99 mg/dL    Comment: Glucose reference range applies only to samples taken after fasting for at least 8 hours.  Glucose, capillary     Status: Abnormal   Collection Time: 10/25/24 11:19 PM  Result Value Ref Range   Glucose-Capillary 247 (H) 70 - 99 mg/dL    Comment: Glucose reference range applies only to samples taken after fasting for at least 8 hours.  Glucose, capillary     Status: Abnormal   Collection Time: 10/26/24  3:07 AM  Result Value Ref Range   Glucose-Capillary 219 (H) 70 - 99  mg/dL    Comment: Glucose reference range applies only to samples taken after fasting for at least 8 hours.  Culture, blood (Routine X 2) w Reflex to ID Panel     Status: None (Preliminary result)   Collection Time: 10/26/24  7:10 AM   Specimen: BLOOD  Result Value Ref Range   Specimen Description BLOOD RT ALINE    Special Requests      BOTTLES DRAWN AEROBIC ONLY Blood Culture results may not be optimal due to an inadequate volume of blood received in culture bottles   Culture      NO GROWTH < 24 HOURS Performed at [facility], [address] [place], [place] [address]    Report Status PENDING   Culture, blood (Routine X 2) w Reflex to ID Panel     Status: None (Preliminary result)   Collection Time: 10/26/24  7:10 AM   Specimen: BLOOD  Result Value Ref Range   Specimen Description BLOOD BLOOD RIGHT ARM    Special Requests      BOTTLES DRAWN AEROBIC AND ANAEROBIC Blood Culture adequate volume   Culture      NO GROWTH < 24 HOURS Performed at [facility], [address] [place], [place] [address]    Report Status PENDING   Glucose, capillary     Status: Abnormal   Collection Time: 10/26/24  7:50 AM  Result Value Ref Range   Glucose-Capillary 277 (H) 70 - 99 mg/dL    Comment: Glucose reference range applies only to samples taken after fasting for at least 8 hours.  Glucose, capillary     Status: Abnormal   Collection Time: 10/26/24 11:33 AM  Result Value Ref Range   Glucose-Capillary 199 (H) 70 - 99 mg/dL    Comment: Glucose reference range applies only to samples taken after fasting for at least 8 hours.   Comment 1 Notify RN    Comment 2 Document in Chart   Body  fluid cell count with differential     Status: Abnormal   Collection Time: 10/26/24  1:05 PM  Result Value Ref Range   Fluid Type-FCT PLEURAL     Comment: LEFT CORRECTED ON 12/10 AT 1337: PREVIOUSLY REPORTED AS Pleural, L    Color, Fluid YELLOW (A) YELLOW   Appearance, Fluid CLEAR CLEAR   Total Nucleated Cell  Count, Fluid 724 0 - 1,000 cu mm   Neutrophil Count, Fluid 64 (H) 0 - 25 %   Lymphs, Fluid 16 %   Monocyte-Macrophage-Serous Fluid 20 (L) 50 - 90 %   Other Cells, Fluid RARE %    Comment: OTHER CELLS IDENTIFIED AS MESOTHELIAL CELLS Performed at [facility], [address] [place], [place] [address]   Lactate dehydrogenase (pleural or peritoneal fluid)     Status: Abnormal   Collection Time: 10/26/24  1:05 PM  Result Value Ref Range   LD, Fluid 55 (H) 3 - 23 U/L    Comment: (NOTE) Results should be evaluated in conjunction with serum values Performed at [facility], [address], [place], [place] [address]    Fluid Type-FLDH PLEURAL     Comment: LEFT Performed at [facility], [address], [place], [place] [address] CORRECTED ON 12/10 AT 1337: PREVIOUSLY REPORTED AS Pleural, L   Glucose, pleural or peritoneal fluid     Status: None   Collection Time: 10/26/24  1:05 PM  Result Value Ref Range   Glucose, Fluid 209 mg/dL    Comment: (NOTE) No normal range established for this test Results should be evaluated in conjunction with serum values Performed at [facility], [address], [place], [place] [address]    Fluid Type-FGLU PLEURAL     Comment: LEFT Performed at [facility], [address] [place], [place] [address] CORRECTED ON 12/10 AT 1337: PREVIOUSLY REPORTED AS Pleural, L   Protein, pleural or peritoneal fluid     Status: None   Collection Time: 10/26/24  1:05 PM  Result Value Ref Range   Total protein, fluid <3.0 g/dL    Comment: (NOTE) No normal range established for this test Results should be evaluated in conjunction with serum values Performed at [facility], [address], [place], [place] [address]    Fluid Type-FTP PLEURAL     Comment: LEFT Performed at [facility], [address] [place], [place] [address] CORRECTED ON 12/10 AT 1337: PREVIOUSLY REPORTED AS Pleural, L   Gram stain     Status: None   Collection Time:  10/26/24  1:05 PM   Specimen: Pleura  Result Value Ref Range   Specimen Description PLEURAL LEFT    Special Requests PLEURAL LEFT    Gram Stain      PLEURAL CYTOSPIN SMEAR NO ORGANISMS SEEN WBC PRESENT,BOTH PMN AND MONONUCLEAR Performed at [facility], [address] [place], [place] [address]    Report Status 10/26/2024 FINAL   Culture, body fluid w Gram Stain-bottle     Status: None (Preliminary result)   Collection Time: 10/26/24  1:05 PM   Specimen: Pleura  Result Value Ref Range   Specimen Description PLEURAL    Special Requests      BOTTLES DRAWN AEROBIC AND ANAEROBIC Blood Culture adequate volume   Culture      NO GROWTH < 24 HOURS Performed at [facility], [address] [place], [place] [address]    Report Status PENDING   Glucose, capillary     Status:  Abnormal   Collection Time: 10/26/24  4:22 PM  Result Value Ref Range   Glucose-Capillary 228 (H) 70 - 99 mg/dL    Comment: Glucose reference range applies only to samples taken after fasting for at least 8 hours.  Glucose, capillary     Status: Abnormal   Collection Time: 10/26/24 10:01 PM  Result Value Ref Range   Glucose-Capillary 226 (H) 70 - 99 mg/dL    Comment: Glucose reference range applies only to samples taken after fasting for at least 8 hours.  Glucose, capillary     Status: Abnormal   Collection Time: 10/27/24  2:26 AM  Result Value Ref Range   Glucose-Capillary 191 (H) 70 - 99 mg/dL    Comment: Glucose reference range applies only to samples taken after fasting for at least 8 hours.  Glucose, capillary     Status: Abnormal   Collection Time: 10/27/24  7:42 AM  Result Value Ref Range   Glucose-Capillary 212 (H) 70 - 99 mg/dL    Comment: Glucose reference range applies only to samples taken after fasting for at least 8 hours.   US  THORACENTESIS ASP PLEURAL SPACE W/IMG GUIDE Result Date: 10/26/2024 INDICATION: Patient admitted 10/24/2024 with severe sepsis, MRSA bacteremia and new left pleural  effusion. Request for diagnostic and therapeutic left thoracentesis. EXAM: ULTRASOUND GUIDED LEFT THORACENTESIS MEDICATIONS: 6 mL 1% lidocaine  COMPLICATIONS: None immediate. PROCEDURE: An ultrasound guided thoracentesis was thoroughly discussed with the patient and questions answered. The benefits, risks, alternatives and complications were also discussed. The patient understands and wishes to proceed with the procedure. Written consent was obtained. Ultrasound was performed to localize and mark an adequate pocket of fluid in the left chest. The area was then prepped and draped in the normal sterile fashion. 1% Lidocaine  was used for local anesthesia. Under ultrasound guidance a 19 gauge, 7-cm, Yueh catheter was introduced. Thoracentesis was performed. The catheter was removed and a dressing applied. FINDINGS: A total of approximately 450 mL of clear yellow fluid was removed. Samples were sent to the laboratory as requested by the clinical team. IMPRESSION: Successful ultrasound guided left thoracentesis yielding 450 mL of pleural fluid. Performed by Clotilda Hesselbach, PA-C Electronically Signed   By: Ester Sides M.D.   On: 10/26/2024 16:13   DG Chest 1 View Result Date: 10/26/2024 EXAM: 1 VIEW(S) XRAY OF THE CHEST 10/26/2024 01:55:07 PM COMPARISON: 2 days ago. CLINICAL HISTORY: 758137 Status post thoracentesis [id number] Status post thoracentesis. FINDINGS: LINES, TUBES AND DEVICES: Stable left internal jugular dialysis catheter. LUNGS AND PLEURA: New right upper lobe opacity is noted concerning for possible infiltrate. Stable left basilar atelectasis and possible small pleural effusion is noted. No pneumothorax is noted. HEART AND MEDIASTINUM: Stable cardiomegaly. BONES AND SOFT TISSUES: No acute osseous abnormality. IMPRESSION: 1. New right upper lobe opacity, concerning for possible pneumonia. 2. Stable left basilar atelectasis with possible small pleural effusion. Electronically signed by: [name] Seip MD  10/26/2024 02:38 PM EST RP Workstation: [id number]    PMH:   Past Medical History:  Diagnosis Date   ESRD on hemodialysis (HCC)    a. HD MWF > 10 years.   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 01/2022 Echo: EF 45-50%, glob HK, mod LVH, mildly reduced RV fxn, mild MR, mod-sev MV Ca2+. Mild AS; b. 10/2023 Echo: EF 45-50%, glob HK, GrII DD, mildly reduced RV fxn, RVSP 52.28mmHg. Mild MR/MS, mod TR.   Hypertension    Nonischemic cardiomyopathy (HCC)    a.  2019 Cath [facility]): Nonobstructive disease; b. 12/2019 Echo: EF 50%; c. 12/2019 MV (Tx w/u [facility]): Ant infarct w/ peri-infarct ischemia. EF 35%; c. 05/2021 MV (Tx w/u [facility]): Cor Ca2+, EF 53%, motion artifact, equiv study, likely low risk.   Nonobstructive coronary artery disease    a.  2019 Cath ([place]): Left main normal, LAD 30 proximal, RCA 30 distal, EF 25%.   Obesity    Peripheral arterial disease    Peripheral vascular disease    Type 2 diabetes mellitus (HCC)    a. 10/2023 - admission w/ HHS.    PSH:   Past Surgical History:  Procedure Laterality Date   A/V FISTULAGRAM N/A 08/15/2019   Procedure: A/V FISTULAGRAM;  Surgeon: Sheree Penne Bruckner, MD;  Location: [facility] INVASIVE CV LAB;  Service: Cardiovascular;  Laterality: N/A;   A/V FISTULAGRAM N/A 01/21/2024   Procedure: A/V Fistulagram;  Surgeon: Melia [name] ORN, MD;  Location: [facility] INVASIVE CV LAB;  Service: Cardiovascular;  Laterality: N/A;   AMPUTATION Right 02/02/2022   Procedure: AMPUTATION 5th RAY;  Surgeon: Joya Stabs, MD;  Location: [facility] OR;  Service: Podiatry;  Laterality: Right;   AMPUTATION Right 05/23/2022   Procedure: RIGHT BELOW KNEE AMPUTATION;  Surgeon: Harden Jerona GAILS, MD;  Location: [facility] OR;  Service: Orthopedics;  Laterality: Right;   AMPUTATION Right 07/04/2022   Procedure: REVISION AMPUTATION BELOW KNEE;  Surgeon: Harden Jerona GAILS, MD;  Location: [facility] OR;  Service: Orthopedics;  Laterality: Right;   APPLICATION OF WOUND VAC  05/23/2022    Procedure: APPLICATION OF WOUND VAC;  Surgeon: Harden Jerona GAILS, MD;  Location: [facility] OR;  Service: Orthopedics;;   AV FISTULA PLACEMENT     CARDIAC CATHETERIZATION  02/17/2018   LHC 02/17/18 ([name]): 30% pLAD, 30% dRCA, LVEDP 25-30, LVEF 25%.     CATARACT EXTRACTION W/PHACO Right 03/29/2020   Procedure: CATARACT EXTRACTION PHACO AND INTRAOCULAR LENS PLACEMENT (IOC)  RIGHT DIABETIC;  Surgeon: Mittie Gaskin, MD;  Location: [facility] ORS;  Service: Ophthalmology;  Laterality: Right;  Lot # [id number] H US : 02:30.9 AP% 9.5% CDE: 14.38   EYE SURGERY Right    cataract removed   [name] N/A 12/02/2023   Procedure: [name];  Surgeon: Federico Rosario BROCKS, MD;  Location: [facility] ENDOSCOPY;  Service: Gastroenterology;  Laterality: N/A;   I & D EXTREMITY Right 02/02/2022   Procedure: IRRIGATION AND DEBRIDEMENT RIGHT FOOT;  Surgeon: Joya Stabs, MD;  Location: [facility] OR;  Service: Podiatry;  Laterality: Right;   INCISION AND DRAINAGE OF WOUND Right 02/08/2022   Procedure: IRRIGATION AND DEBRIDEMENT WOUND;  Surgeon: Burt Fus, DPM;  Location: [facility] OR;  Service: Podiatry;  Laterality: Right;  with removal of infected bone    INSERTION OF DIALYSIS CATHETER  08/13/2019   Procedure: Insertion Of Dialysis Catheter;  Surgeon: Oris Krystal FALCON, MD;  Location: [facility] OR;  Service: Vascular;;   INSERTION OF DIALYSIS CATHETER Left 07/19/2020   Procedure: INSERTION OF DIALYSIS CATHETER;  Surgeon: Sheree Penne Bruckner, MD;  Location: [facility] OR;  Service: Vascular;  Laterality: Left;   IR FLUORO GUIDE CV LINE LEFT  02/16/2024   IR LUMBAR PUNCTURE  11/20/2023   IR US  GUIDE BX ASP/DRAIN  11/07/2022   IR US  GUIDE VASC ACCESS LEFT  02/16/2024   IRRIGATION AND DEBRIDEMENT ABSCESS     PERIPHERAL VASCULAR BALLOON ANGIOPLASTY  08/15/2019   Procedure: PERIPHERAL VASCULAR BALLOON ANGIOPLASTY;  Surgeon: Sheree Penne Bruckner, MD;  Location: [facility] INVASIVE CV LAB;  Service: Cardiovascular;;   PERIPHERAL  VASCULAR  BALLOON ANGIOPLASTY Left 02/06/2022   Procedure: PERIPHERAL VASCULAR BALLOON ANGIOPLASTY;  Surgeon: Gretta Lonni PARAS, MD;  Location: [facility] INVASIVE CV LAB;  Service: Cardiovascular;  Laterality: Left;   PERIPHERAL VASCULAR BALLOON ANGIOPLASTY  01/21/2024   Procedure: PERIPHERAL VASCULAR BALLOON ANGIOPLASTY;  Surgeon: Melia [name] ORN, MD;  Location: [facility] INVASIVE CV LAB;  Service: Cardiovascular;;  Cephalic Arch   RADIOLOGY WITH ANESTHESIA N/A 11/20/2023   Procedure: MRI WITH ANESTHESIA;  Surgeon: Radiologist, Medication, MD;  Location: [facility] OR;  Service: Radiology;  Laterality: N/A;   RADIOLOGY WITH ANESTHESIA N/A 11/20/2023   Procedure: IR WITH ANESTHESIA;  Surgeon: Alona Corners, DO;  Location: [facility] OR;  Service: Anesthesiology;  Laterality: N/A;  PLEASE OBTAIN OPENING PRESSURE AND DRAIN AT LEAST 30CC'S OF CSF.   REVISON OF ARTERIOVENOUS FISTULA Left 07/19/2020   Procedure: REVISON/PLICATION OF ARTERIOVENOUS FISTULA LEFT;  Surgeon: Sheree Penne Lonni, MD;  Location: [facility] OR;  Service: Vascular;  Laterality: Left;   THROMBECTOMY AND REVISION OF ARTERIOVENTOUS (AV) GORETEX  GRAFT Left 08/13/2019   Procedure: THROMBECTOMY OF LEFT ARM ARTERIOVENTOUS (AV) FISTULA;  Surgeon: Oris Krystal FALCON, MD;  Location: [facility] OR;  Service: Vascular;  Laterality: Left;   TOE AMPUTATION     UPPER EXTREMITY VENOGRAPHY Bilateral 03/30/2024   Procedure: UPPER EXTREMITY VENOGRAPHY;  Surgeon: Lanis Fonda BRAVO, MD;  Location: [facility] INVASIVE CV LAB;  Service: Cardiovascular;  Laterality: Bilateral;    Allergies:  Allergies  Allergen Reactions   Sulfa Antibiotics Diarrhea and Nausea And Vomiting    Medications:   Prior to Admission medications   Medication Sig Start Date End Date Taking? Authorizing Provider  acetaminophen  (TYLENOL ) 500 MG tablet Take 1,000 mg by mouth 2 (two) times daily as needed for moderate pain (pain score 4-6), headache or fever.   Yes [provider]  aspirin  EC 81 MG tablet Take 1 tablet (81 mg total) by  mouth daily with breakfast. Swallow whole. 02/17/24  Yes Emokpae, Courage, MD  cinacalcet  (SENSIPAR ) 30 MG tablet Take 30 mg by mouth daily.   Yes [provider]  ciprofloxacin  (CIPRO ) 500 MG tablet Take 1 tablet (500 mg total) by mouth 2 (two) times daily. One po bid x 7 days 10/18/24  Yes Delo, Vicenta, MD  doxycycline  (VIBRAMYCIN ) 100 MG capsule Take 1 capsule (100 mg total) by mouth 2 (two) times daily. One po bid x 7 days 10/16/24  Yes Zammit, Joseph, MD  gabapentin  (NEURONTIN ) 300 MG capsule Take 300 mg by mouth daily.   Yes [provider]  insulin  isophane & regular human KwikPen (NOVOLIN  70/30 KWIKPEN) (70-30) 100 UNIT/ML KwikPen Inject 30U in the morning and 25U in the evening Patient taking differently: Inject 25-30 Units into the skin See admin instructions. Inject 30 Units in the morning and 25 Units in the evening 10/27/23  Yes Ezenduka, Nkeiruka J, MD  midodrine  (PROAMATINE ) 10 MG tablet Take 10 mg by mouth 3 (three) times daily. 06/10/24  Yes [provider]  Insulin  Pen Needle 32G X 4 MM MISC Use with insulin  pen. 10/28/23   Ezenduka, Nkeiruka J, MD  pantoprazole  (PROTONIX ) 40 MG tablet Take 1 tablet (40 mg total) by mouth 2 (two) times daily. Patient not taking: Reported on 10/24/2024 01/13/24 06/07/24  Verdene Purchase, MD    Discontinued Meds:   Medications Discontinued During This Encounter  Medication Reason   midodrine  (PROAMATINE ) 5 MG tablet Patient Preference   cinacalcet  (SENSIPAR ) 60 MG tablet Patient Preference   cinacalcet  (SENSIPAR ) 60 MG tablet Patient  Preference   atorvastatin  (LIPITOR) 40 MG tablet Patient Preference   sevelamer  carbonate (RENVELA ) 800 MG tablet Patient Preference   midodrine  (PROAMATINE ) tablet 10 mg    cinacalcet  (SENSIPAR ) tablet 30 mg    ceFEPIme  (MAXIPIME ) 1 g in sodium chloride  0.9 % 100 mL IVPB    ceFEPIme  (MAXIPIME ) 2 g in sodium chloride  0.9 % 100 mL IVPB    vancomycin  variable dose per unstable renal function  (pharmacist dosing)     Social History:  reports that he has never smoked. He has never used smokeless tobacco. He reports current alcohol use. He reports that he does not use drugs.  Family History:   Family History  Problem Relation Age of Onset   Hypertension Mother     Blood pressure 95/60, pulse 87, temperature (!) 97.4 F (36.3 C), temperature source Axillary, resp. rate 20, height 6' 1 (1.854 m), weight 104.4 kg, SpO2 94%. Physical Exam: GEN: NAD, A&Ox3, NCAT, pleasant HEENT: No conjunctival pallor, EOMI NECK: Supple, no thyromegaly LUNGS: decreased bl in the rt lower field but no rales, rhonchi or wheezing CV: tachy, No M/R/G ABD: SNDNT +BS  EXT: No lower extremity edema, rt BKA well healed w no open sores ACCESS: [name] ROYCE MELIA [name] LELON, MD 10/27/2024, 10:28 AM

## 2024-10-27 NOTE — Progress Notes (Signed)
 Pharmacy Antibiotic Note  [name] [name]. is a 63 y.o. male admitted on 10/24/2024 with MRSA bacteremia.  Pharmacy has been consulted for vancomycin   Patient with significant past medical history of ESRD on dialysis, atrial fibrillation, systolic CHF, hypertension, diabetes, LBBB.    Plan: Continue vancomycin  1g after each HD session.   Vanco levels with upcoming doses  Height: 6' 1 (185.4 cm) Weight: 104.4 kg (230 lb 2.6 oz) IBW/kg (Calculated) : 79.9  Temp (24hrs), Avg:98.2 F (36.8 C), Min:97.4 F (36.3 C), Max:99.2 F (37.3 C)  Recent Labs  Lab 10/24/24 1014 10/24/24 1112 10/24/24 1408 10/24/24 1608 10/25/24 0442  WBC 10.9*  --   --   --  16.1*  CREATININE  --  10.50*  --   --  11.20*  LATICACIDVEN  --   --  2.1* 2.7* 2.5*    Estimated Creatinine Clearance: 8.7 mL/min (A) (by C-G formula based on SCr of 11.2 mg/dL (H)).    Allergies  Allergen Reactions   Sulfa Antibiotics Diarrhea and Nausea And Vomiting    CTX/azith 12/8 x1 Vanc 12/8> Cefepime  12/8  12/10 Bcx: ngtd 12/8 Bcx: gram + cocci 4/4 bottles BCID: MRSA MRSA PCR: +   Thank you for allowing pharmacy to be a part of this patients care.  Elspeth Sour, PharmD Clinical Pharmacist 10/27/2024 10:43 AM

## 2024-10-27 NOTE — Progress Notes (Signed)
 Virtual Visit via Video Note  I connected with THURMON MIZELL Sr. on 12/11/2025t  by a video enabled telemedicine application and verified that I am speaking with the correct person using two identifiers.  Location: Patient: ICU [address] Provider: [facility]   I discussed the limitations of evaluation and management by telemedicine and the availability of in person appointments. The patient expressed understanding and agreed to proceed.  Subjective: No new complaints   Antibiotics:  Anti-infectives (From admission, onward)    Start     Dose/Rate Route Frequency Ordered Stop   10/25/24 1800  ceFEPIme  (MAXIPIME ) 1 g in sodium chloride  0.9 % 100 mL IVPB  Status:  Discontinued        1 g 200 mL/hr over 30 Minutes Intravenous Every 24 hours 10/24/24 1642 10/25/24 0906   10/25/24 1600  ceFEPIme  (MAXIPIME ) 2 g in sodium chloride  0.9 % 100 mL IVPB  Status:  Discontinued        2 g 200 mL/hr over 30 Minutes Intravenous Every T-Th-Sa (Hemodialysis) 10/25/24 0906 10/25/24 1323   10/25/24 1600  vancomycin  (VANCOCIN ) IVPB 1000 mg/200 mL premix        1,000 mg 200 mL/hr over 60 Minutes Intravenous Every T-Th-Sa (Hemodialysis) 10/25/24 0908     10/24/24 1800  ceFEPIme  (MAXIPIME ) 2 g in sodium chloride  0.9 % 100 mL IVPB        2 g 200 mL/hr over 30 Minutes Intravenous  Once 10/24/24 1638 10/24/24 1720   10/24/24 1730  vancomycin  (VANCOREADY) IVPB 2000 mg/400 mL        2,000 mg 200 mL/hr over 120 Minutes Intravenous  Once 10/24/24 1638 10/24/24 1926   10/24/24 1639  vancomycin  variable dose per unstable renal function (pharmacist dosing)  Status:  Discontinued         Does not apply See admin instructions 10/24/24 1639 10/26/24 0843   10/24/24 1430  cefTRIAXone  (ROCEPHIN ) 1 g in sodium chloride  0.9 % 100 mL IVPB        1 g 200 mL/hr over 30 Minutes Intravenous  Once 10/24/24 1419 10/24/24 1536   10/24/24 1430  azithromycin  (ZITHROMAX ) 500 mg in sodium chloride  0.9 % 250 mL  IVPB        500 mg 250 mL/hr over 60 Minutes Intravenous  Once 10/24/24 1419 10/24/24 1724       Medications: Scheduled Meds:  aspirin  EC  81 mg Oral Q breakfast   Chlorhexidine  Gluconate Cloth  6 each Topical Q0600   cinacalcet   60 mg Oral Q breakfast   gabapentin   300 mg Oral Daily   heparin   5,000 Units Subcutaneous Q8H   insulin  aspart  0-5 Units Subcutaneous QHS   insulin  aspart  0-9 Units Subcutaneous TID WC   midodrine   20 mg Oral TID   mupirocin  ointment  1 Application Nasal BID   sevelamer  carbonate  4,000 mg Oral TID WC   Continuous Infusions:  anticoagulant sodium citrate      norepinephrine  (LEVOPHED ) Adult infusion 4 mcg/min (10/27/24 1039)   vancomycin  Stopped (10/25/24 1907)   PRN Meds:.acetaminophen  **OR** acetaminophen , alteplase , anticoagulant sodium citrate , heparin , heparin , ipratropium-albuterol , lidocaine  (PF), lidocaine -prilocaine , ondansetron  **OR** ondansetron  (ZOFRAN ) IV, mouth rinse, pentafluoroprop-tetrafluoroeth, polyethylene glycol    Objective: Weight change:   Intake/Output Summary (Last 24 hours) at 10/27/2024 1449 Last data filed at 10/27/2024 0903 Gross per 24 hour  Intake 476.49 ml  Output --  Net 476.49 ml   Blood pressure (!) 96/50,  pulse 62, temperature (!) 97.5 F (36.4 C), temperature source Oral, resp. rate 15, height 6' 1 (1.854 m), weight 104.4 kg, SpO2 94%. Temp:  [97.4 F (36.3 C)-99.2 F (37.3 C)] 97.5 F (36.4 C) (12/11 1139) Pulse Rate:  [62-92] 62 (12/11 1039) Resp:  [13-26] 15 (12/11 1107) BP: (96)/(50) 96/50 (12/11 1050) SpO2:  [88 %-98 %] 94 % (12/10 1845) Arterial Line BP: (71-135)/(31-99) 135/64 (12/11 1107)  Physical Exam: Physical Exam Constitutional:      Appearance: He is well-developed.  HENT:     Head: Normocephalic and atraumatic.     Mouth/Throat:     Pharynx: Posterior oropharyngeal erythema present.  Eyes:     Extraocular Movements: Extraocular movements intact.     Conjunctiva/sclera:  Conjunctivae normal.  Pulmonary:     Effort: Pulmonary effort is normal. No respiratory distress.  Abdominal:     General: There is no distension.  Skin:    General: Skin is warm and dry.  Neurological:     Mental Status: He is alert.  Psychiatric:        Mood and Affect: Mood normal.        Behavior: Behavior normal.        Thought Content: Thought content normal.        Judgment: Judgment normal.      CBC:    BMET Recent Labs    10/25/24 0442  NA 130*  K 4.1  CL 88*  CO2 19*  GLUCOSE 215*  BUN 63*  CREATININE 11.20*  CALCIUM  9.2     Liver Panel  Recent Labs    10/24/24 1457 10/25/24 0442  PROT 7.8  --   ALBUMIN  3.0* 3.0*  AST 23  --   ALT 10  --   ALKPHOS 118  --   BILITOT 2.4*  --   BILIDIR 1.7*  --   IBILI 0.7  --        Sedimentation Rate No results for input(s): ESRSEDRATE in the last 72 hours. C-Reactive Protein No results for input(s): CRP in the last 72 hours.  Micro Results: Recent Results (from the past 720 hours)  Resp panel by RT-PCR (RSV, Flu A&B, Covid) Anterior Nasal Swab     Status: None   Collection Time: 10/24/24 10:20 AM   Specimen: Anterior Nasal Swab  Result Value Ref Range Status   SARS Coronavirus 2 by RT PCR NEGATIVE NEGATIVE Final    Comment: (NOTE) SARS-CoV-2 target nucleic acids are NOT DETECTED.  The SARS-CoV-2 RNA is generally detectable in upper respiratory specimens during the acute phase of infection. The lowest concentration of SARS-CoV-2 viral copies this assay can detect is 138 copies/mL. A negative result does not preclude SARS-Cov-2 infection and should not be used as the sole basis for treatment or other patient management decisions. A negative result may occur with  improper specimen collection/handling, submission of specimen other than nasopharyngeal swab, presence of viral mutation(s) within the areas targeted by this assay, and inadequate number of viral copies(<138 copies/mL). A negative  result must be combined with clinical observations, patient history, and epidemiological information. The expected result is Negative.  Fact Sheet for Patients:  bloggercourse.com  Fact Sheet for Healthcare Providers:  seriousbroker.it  This test is no t yet approved or cleared by the [place]  FDA and  has been authorized for detection and/or diagnosis of SARS-CoV-2 by FDA under an Emergency Use Authorization (EUA). This EUA will remain  in effect (meaning this test can be used) for  the duration of the COVID-19 declaration under Section 564(b)(1) of the Act, 21 U.S.C.section 360bbb-3(b)(1), unless the authorization is terminated  or revoked sooner.       Influenza A by PCR NEGATIVE NEGATIVE Final   Influenza B by PCR NEGATIVE NEGATIVE Final    Comment: (NOTE) The Xpert Xpress SARS-CoV-2/FLU/RSV plus assay is intended as an aid in the diagnosis of influenza from Nasopharyngeal swab specimens and should not be used as a sole basis for treatment. Nasal washings and aspirates are unacceptable for Xpert Xpress SARS-CoV-2/FLU/RSV testing.  Fact Sheet for Patients: bloggercourse.com  Fact Sheet for Healthcare Providers: seriousbroker.it  This test is not yet approved or cleared by the [place]  FDA and has been authorized for detection and/or diagnosis of SARS-CoV-2 by FDA under an Emergency Use Authorization (EUA). This EUA will remain in effect (meaning this test can be used) for the duration of the COVID-19 declaration under Section 564(b)(1) of the Act, 21 U.S.C. section 360bbb-3(b)(1), unless the authorization is terminated or revoked.     Resp Syncytial Virus by PCR NEGATIVE NEGATIVE Final    Comment: (NOTE) Fact Sheet for Patients: bloggercourse.com  Fact Sheet for Healthcare Providers: seriousbroker.it  This  test is not yet approved or cleared by the [place]  FDA and has been authorized for detection and/or diagnosis of SARS-CoV-2 by FDA under an Emergency Use Authorization (EUA). This EUA will remain in effect (meaning this test can be used) for the duration of the COVID-19 declaration under Section 564(b)(1) of the Act, 21 U.S.C. section 360bbb-3(b)(1), unless the authorization is terminated or revoked.  Performed at [facility], [address] [place], [place] [address]   Culture, blood (routine x 2)     Status: Abnormal   Collection Time: 10/24/24  2:07 PM   Specimen: BLOOD RIGHT FOREARM  Result Value Ref Range Status   Specimen Description   Final    BLOOD RIGHT FOREARM BOTTLES DRAWN AEROBIC AND ANAEROBIC Performed at [facility], [address], [place], [place] [address]    Special Requests   Final    Blood Culture results may not be optimal due to an inadequate volume of blood received in culture bottles Performed at [facility], [address] [place], [place] [address]    Culture  Setup Time   Final    IN BOTH AEROBIC AND ANAEROBIC BOTTLES GRAM POSITIVE COCCI IN CLUSTERS Gram Stain Report Called to,Read Back By and Verified With: K. [facility] RN 10/25/24 @0709  BY J. WHITE Performed at [facility], [address], [place], [place] [address]    Culture (A)  Final    STAPHYLOCOCCUS AUREUS SUSCEPTIBILITIES PERFORMED ON PREVIOUS CULTURE WITHIN THE LAST 5 DAYS. Performed at [facility], [address], [place], [place] [address]    Report Status 10/27/2024 FINAL  Final  Culture, blood (routine x 2)     Status: Abnormal (Preliminary result)   Collection Time: 10/24/24  2:51 PM   Specimen: BLOOD  Result Value Ref Range Status   Specimen Description   Final    BLOOD BLOOD RIGHT ARM Performed at [facility], [address], [place], [place] [address]    Special Requests   Final    BOTTLES DRAWN AEROBIC AND ANAEROBIC Blood Culture adequate volume Performed at [facility], [address], [place], [place] [address]    Culture  Setup Time   Final    IN BOTH AEROBIC AND ANAEROBIC BOTTLES GRAM POSITIVE COCCI IN CLUSTERS Gram Stain Report Called  to,Read Back By and Verified With: K. [facility] RN 10/25/24 @0709  BY J. WHITE CRITICAL RESULT CALLED TO, READ BACK BY AND VERIFIED WITH: PHARMD LAURIE POOLE 10/25/24 @1034  PR    Culture (A)  Final    METHICILLIN RESISTANT STAPHYLOCOCCUS AUREUS Sent to [facility] for further susceptibility testing. Performed at [facility], [address], [place], [place] [address]    Report Status PENDING  Incomplete   Organism ID, Bacteria METHICILLIN RESISTANT STAPHYLOCOCCUS AUREUS  Final      Susceptibility   Methicillin resistant staphylococcus aureus - MIC*    CIPROFLOXACIN  >=8 RESISTANT Resistant     ERYTHROMYCIN >=8 RESISTANT Resistant     GENTAMICIN <=0.5 SENSITIVE Sensitive     OXACILLIN >=4 RESISTANT Resistant     TETRACYCLINE >=16 RESISTANT Resistant     VANCOMYCIN  1 SENSITIVE Sensitive     TRIMETH/SULFA <=10 SENSITIVE Sensitive     CLINDAMYCIN >=8 RESISTANT Resistant     RIFAMPIN <=0.5 SENSITIVE Sensitive     Inducible Clindamycin NEGATIVE Sensitive     LINEZOLID 2 SENSITIVE Sensitive     * METHICILLIN RESISTANT STAPHYLOCOCCUS AUREUS  Blood Culture ID Panel (Reflexed)     Status: Abnormal   Collection Time: 10/24/24  2:51 PM  Result Value Ref Range Status   Enterococcus faecalis NOT DETECTED NOT DETECTED Final   Enterococcus Faecium NOT DETECTED NOT DETECTED Final   Listeria monocytogenes NOT DETECTED NOT DETECTED Final   Staphylococcus species DETECTED (A) NOT DETECTED Final    Comment: CRITICAL RESULT CALLED TO, READ BACK BY AND VERIFIED WITH: PHARMD LAURIE POOLE 10/25/24 @1034  PR    Staphylococcus aureus (BCID) DETECTED (A) NOT DETECTED Final    Comment: Methicillin (oxacillin)-resistant Staphylococcus aureus (MRSA). MRSA is predictably resistant to beta-lactam antibiotics (except ceftaroline).  Preferred therapy is vancomycin  unless clinically contraindicated. Patient requires contact precautions if  hospitalized. CRITICAL RESULT CALLED TO, READ BACK BY AND VERIFIED WITH: PHARMD LAURIE POOLE 10/25/24 @1034  PR    Staphylococcus epidermidis NOT DETECTED NOT DETECTED Final   Staphylococcus lugdunensis NOT DETECTED NOT DETECTED Final   Streptococcus species NOT DETECTED NOT DETECTED Final   Streptococcus agalactiae NOT DETECTED NOT DETECTED Final   Streptococcus pneumoniae NOT DETECTED NOT DETECTED Final   Streptococcus pyogenes NOT DETECTED NOT DETECTED Final   A.calcoaceticus-baumannii NOT DETECTED NOT DETECTED Final   Bacteroides fragilis NOT DETECTED NOT DETECTED Final   Enterobacterales NOT DETECTED NOT DETECTED Final   Enterobacter cloacae complex NOT DETECTED NOT DETECTED Final   Escherichia coli NOT DETECTED NOT DETECTED Final   Klebsiella aerogenes NOT DETECTED NOT DETECTED Final   Klebsiella oxytoca NOT DETECTED NOT DETECTED Final   Klebsiella pneumoniae NOT DETECTED NOT DETECTED Final   Proteus species NOT DETECTED NOT DETECTED Final   Salmonella species NOT DETECTED NOT DETECTED Final   Serratia marcescens NOT DETECTED NOT DETECTED Final   Haemophilus influenzae NOT DETECTED NOT DETECTED Final   Neisseria meningitidis NOT DETECTED NOT DETECTED Final   Pseudomonas aeruginosa NOT DETECTED NOT DETECTED Final   Stenotrophomonas maltophilia NOT DETECTED NOT DETECTED Final   Candida albicans NOT DETECTED NOT DETECTED Final   Candida auris NOT DETECTED NOT DETECTED Final   Candida glabrata NOT DETECTED NOT DETECTED Final   Candida krusei NOT DETECTED NOT DETECTED Final   Candida parapsilosis NOT DETECTED NOT DETECTED Final   Candida tropicalis NOT DETECTED NOT DETECTED Final   Cryptococcus neoformans/gattii NOT DETECTED NOT DETECTED Final   Meth resistant mecA/C and MREJ DETECTED (A) NOT DETECTED Final  Comment: CRITICAL RESULT CALLED TO, READ BACK BY AND VERIFIED  WITH: PHARMD LAURIE POOLE 10/25/24 @1034  PR Performed at [facility], [address] [place], [place] [address]   MRSA Next Gen by PCR, Nasal     Status: Abnormal   Collection Time: 10/24/24  4:33 PM   Specimen: Nasal Mucosa; Nasal Swab  Result Value Ref Range Status   MRSA by PCR Next Gen DETECTED (A) NOT DETECTED Final    Comment: RESULT CALLED TO, READ BACK BY AND VERIFIED WITH: C KINSLEY,RN@1950  1208/25 MK (NOTE) The GeneXpert MRSA Assay (FDA approved for NASAL specimens only), is one component of a comprehensive MRSA colonization surveillance program. It is not intended to diagnose MRSA infection nor to guide or monitor treatment for MRSA infections. Test performance is not FDA approved in patients less than 62 years old. Performed at [facility], [address] [place], [place] [address]   Culture, blood (Routine X 2) w Reflex to ID Panel     Status: None (Preliminary result)   Collection Time: 10/26/24  7:10 AM   Specimen: BLOOD  Result Value Ref Range Status   Specimen Description BLOOD RT ALINE  Final   Special Requests   Final    BOTTLES DRAWN AEROBIC ONLY Blood Culture results may not be optimal due to an inadequate volume of blood received in culture bottles   Culture   Final    NO GROWTH < 24 HOURS Performed at [facility], [address], [place], [place] [address]    Report Status PENDING  Incomplete  Culture, blood (Routine X 2) w Reflex to ID Panel     Status: None (Preliminary result)   Collection Time: 10/26/24  7:10 AM   Specimen: BLOOD  Result Value Ref Range Status   Specimen Description BLOOD BLOOD RIGHT ARM  Final   Special Requests   Final    BOTTLES DRAWN AEROBIC AND ANAEROBIC Blood Culture adequate volume   Culture   Final    NO GROWTH < 24 HOURS Performed at [facility], [address] [place] [address] [place], [place] [address]    Report Status PENDING  Incomplete  Gram stain     Status: None   Collection Time: 10/26/24  1:05 PM   Specimen: Pleura   Result Value Ref Range Status   Specimen Description PLEURAL LEFT  Final   Special Requests PLEURAL LEFT  Final   Gram Stain   Final    PLEURAL CYTOSPIN SMEAR NO ORGANISMS SEEN WBC PRESENT,BOTH PMN AND MONONUCLEAR Performed at [facility], [address], [place], [place] [address]    Report Status 10/26/2024 FINAL  Final  Culture, body fluid w Gram Stain-bottle     Status: None (Preliminary result)   Collection Time: 10/26/24  1:05 PM   Specimen: Pleura  Result Value Ref Range Status   Specimen Description PLEURAL  Final   Special Requests   Final    BOTTLES DRAWN AEROBIC AND ANAEROBIC Blood Culture adequate volume   Culture   Final    NO GROWTH < 24 HOURS Performed at [facility], [address] [place], [place] [address]    Report Status PENDING  Incomplete    Studies/Results: US  THORACENTESIS ASP PLEURAL SPACE W/IMG GUIDE Result Date: 10/26/2024 INDICATION: Patient admitted 10/24/2024 with severe sepsis, MRSA bacteremia and new left pleural effusion. Request for diagnostic and therapeutic left thoracentesis. EXAM: ULTRASOUND GUIDED LEFT THORACENTESIS MEDICATIONS: 6 mL 1% lidocaine  COMPLICATIONS: None immediate. PROCEDURE: An ultrasound guided thoracentesis was thoroughly  discussed with the patient and questions answered. The benefits, risks, alternatives and complications were also discussed. The patient understands and wishes to proceed with the procedure. Written consent was obtained. Ultrasound was performed to localize and mark an adequate pocket of fluid in the left chest. The area was then prepped and draped in the normal sterile fashion. 1% Lidocaine  was used for local anesthesia. Under ultrasound guidance a 19 gauge, 7-cm, Yueh catheter was introduced. Thoracentesis was performed. The catheter was removed and a dressing applied. FINDINGS: A total of approximately 450 mL of clear yellow fluid was removed. Samples were sent to the laboratory as requested by the clinical team.  IMPRESSION: Successful ultrasound guided left thoracentesis yielding 450 mL of pleural fluid. Performed by Clotilda Hesselbach, PA-C Electronically Signed   By: Ester Sides M.D.   On: 10/26/2024 16:13   DG Chest 1 View Result Date: 10/26/2024 EXAM: 1 VIEW(S) XRAY OF THE CHEST 10/26/2024 01:55:07 PM COMPARISON: 2 days ago. CLINICAL HISTORY: [id number] Status post thoracentesis [id number] Status post thoracentesis. FINDINGS: LINES, TUBES AND DEVICES: Stable left internal jugular dialysis catheter. LUNGS AND PLEURA: New right upper lobe opacity is noted concerning for possible infiltrate. Stable left basilar atelectasis and possible small pleural effusion is noted. No pneumothorax is noted. HEART AND MEDIASTINUM: Stable cardiomegaly. BONES AND SOFT TISSUES: No acute osseous abnormality. IMPRESSION: 1. New right upper lobe opacity, concerning for possible pneumonia. 2. Stable left basilar atelectasis with possible small pleural effusion. Electronically signed by: Lynwood Seip MD 10/26/2024 02:38 PM EST RP Workstation: [id number]      Assessment/Plan:  INTERVAL HISTORY: Arterial line and tunneled dialysis catheter still in place   Principal Problem:   Multifocal pneumonia Active Problems:   ESRD on dialysis (HCC)   DM2 (diabetes mellitus, type 2) (HCC)   MRSA bacteremia   History of CVA    Hypotension   Below-knee amputation of right lower extremity (HCC)   CAD (coronary artery disease)   Type 2 diabetes mellitus with diabetic chronic kidney disease (HCC)   Chronic systolic CHF (congestive heart failure) (HCC)   Peripheral vascular disease, unspecified   Severe sepsis with septic shock   Sepsis due to methicillin resistant Staphylococcus aureus (MRSA) (HCC)   Pleural effusion   End stage renal disease (HCC)    [name] is a 63 y.o. male with  ESRD on HD, DM, now admitted with MRSA bacteremia, multifocal pneumonia and pleural effusion described on CT as loculated, now sp  thoracentesis.  #1 MRSA bacteremia  -- Still needs a transthoracic echocardiogram and will need a transesophageal echocardiogram as well --he will need a central line holiday --Friday tunneled HD catheter will removed post HD and he needs his arterial line out as well --catheter holiday over weekend --checck blood cultures post HD catheter and arterial line removal  --followup blood, pleural fluid cultures though pleural fluid is not an empyema and seems to be more of a transudate  #2 Multifocal pneumonia I believe this is all due to MRSA and could be due to septic emboli from the HD catheter or a right sided vegetation  #3 Pleural effusion sp thoracentesis appears more transudative  I personally spent a total of 50 minutes in the care of the patient today including preparing to see the patient, getting/reviewing separately obtained history, performing a medically appropriate exam/evaluation, counseling and educating, placing orders, referring and communicating with other health care professionals, documenting clinical information in the EHR, and independently interpreting results.   Evaluation of  the patient requires complex antimicrobial therapy evaluation, counseling , isolation needs to reduce disease transmission and risk assessment and mitigation.      LOS: 3 days   Jomarie Fleeta Rothman 10/27/2024, 2:49 PM

## 2024-10-28 DIAGNOSIS — J181 Lobar pneumonia, unspecified organism: Secondary | ICD-10-CM | POA: Diagnosis not present

## 2024-10-28 DIAGNOSIS — R931 Abnormal findings on diagnostic imaging of heart and coronary circulation: Secondary | ICD-10-CM

## 2024-10-28 LAB — GLUCOSE, CAPILLARY
Glucose-Capillary: 218 mg/dL — ABNORMAL HIGH (ref 70–99)
Glucose-Capillary: 241 mg/dL — ABNORMAL HIGH (ref 70–99)
Glucose-Capillary: 172 mg/dL — ABNORMAL HIGH (ref 70–99)
Glucose-Capillary: 275 mg/dL — ABNORMAL HIGH (ref 70–99)
Glucose-Capillary: 224 mg/dL — ABNORMAL HIGH (ref 70–99)

## 2024-10-28 LAB — CBC
HCT: 36.2 % — ABNORMAL LOW (ref 39.0–52.0)
Hemoglobin: 12 g/dL — ABNORMAL LOW (ref 13.0–17.0)
MCH: 24.5 pg — ABNORMAL LOW (ref 26.0–34.0)
MCHC: 33.1 g/dL (ref 30.0–36.0)
MCV: 73.9 fL — ABNORMAL LOW (ref 80.0–100.0)
Platelets: 208 K/uL (ref 150–400)
RBC: 4.9 MIL/uL (ref 4.22–5.81)
RDW: 25.8 % — ABNORMAL HIGH (ref 11.5–15.5)
WBC: 9.1 K/uL (ref 4.0–10.5)
nRBC: 0 % (ref 0.0–0.2)

## 2024-10-28 LAB — ECHOCARDIOGRAM COMPLETE
AR max vel: 0.69 cm2
AV Area VTI: 0.77 cm2
AV Area mean vel: 0.64 cm2
AV Mean grad: 24 mmHg
AV Peak grad: 41 mmHg
Ao pk vel: 3.2 m/s
Area-P 1/2: 4.6 cm2
Height: 73 in
MV M vel: 5.2 m/s
MV Peak grad: 108.2 mmHg
P 1/2 time: 435 ms
Radius: 0.55 cm
S' Lateral: 5.3 cm
Weight: 3682.56 [oz_av]

## 2024-10-28 LAB — RENAL FUNCTION PANEL
Albumin: 2.8 g/dL — ABNORMAL LOW (ref 3.5–5.0)
Anion gap: 16 — ABNORMAL HIGH (ref 5–15)
BUN: 59 mg/dL — ABNORMAL HIGH (ref 8–23)
CO2: 25 mmol/L (ref 22–32)
Calcium: 7.8 mg/dL — ABNORMAL LOW (ref 8.9–10.3)
Chloride: 90 mmol/L — ABNORMAL LOW (ref 98–111)
Creatinine, Ser: 10.9 mg/dL — ABNORMAL HIGH (ref 0.61–1.24)
GFR, Estimated: 5 mL/min — ABNORMAL LOW (ref >60)
Glucose, Bld: 234 mg/dL — ABNORMAL HIGH (ref 70–99)
Phosphorus: 5.5 mg/dL — ABNORMAL HIGH (ref 2.5–4.6)
Potassium: 3.9 mmol/L (ref 3.5–5.1)
Sodium: 130 mmol/L — ABNORMAL LOW (ref 135–145)

## 2024-10-28 MED ORDER — VANCOMYCIN HCL IN DEXTROSE 1-5 GM/200ML-% IV SOLN
1000.0000 mg | INTRAVENOUS | Status: DC
  Administered 2024-10-28 – 2024-11-04 (×3): 1000 mg via INTRAVENOUS
  Filled 2024-10-28 (×3): qty 200

## 2024-10-28 NOTE — Procedures (Signed)
 I was present at this dialysis session. I have reviewed the session and made appropriate changes.   Seen in ICU on HD.  L internal jugular TDC. 3K bath UF goal 2L.  BPs supported by both high dose midodrine  and 4mcg NE gtt.  COnfirmed plan for [facility] removal by general surgery post HD today.  Discussed plan with [facility] RN.    Pt tolerating treatment well.    Filed Weights   10/25/24 1242 10/26/24 0417 10/28/24 0738  Weight: 102.5 kg 104.4 kg 107.2 kg    Recent Labs  Lab 10/28/24 0749  NA 130*  K 3.9  CL 90*  CO2 25  GLUCOSE 234*  BUN 59*  CREATININE 10.90*  CALCIUM  7.8*  PHOS 5.5*    Recent Labs  Lab 10/24/24 1014 10/25/24 0442 10/28/24 0749  WBC 10.9* 16.1* 9.1  NEUTROABS 8.0*  --   --   HGB 14.0 12.6* 12.0*  HCT 43.1 37.8* 36.2*  MCV 75.3* 74.6* 73.9*  PLT 146* 185 208    Scheduled Meds:  aspirin  EC  81 mg Oral Q breakfast   Chlorhexidine  Gluconate Cloth  6 each Topical Q0600   cinacalcet   60 mg Oral Q breakfast   gabapentin   300 mg Oral Daily   heparin   5,000 Units Subcutaneous Q8H   insulin  aspart  0-5 Units Subcutaneous QHS   insulin  aspart  0-9 Units Subcutaneous TID WC   midodrine   20 mg Oral TID   mupirocin  ointment  1 Application Nasal BID   sevelamer  carbonate  4,000 mg Oral TID WC   vancomycin  variable dose per unstable renal function (pharmacist dosing)   Does not apply See admin instructions   Continuous Infusions:  anticoagulant sodium citrate      norepinephrine  (LEVOPHED ) Adult infusion 4 mcg/min (10/27/24 2145)   vancomycin      PRN Meds:.acetaminophen  **OR** acetaminophen , alteplase , anticoagulant sodium citrate , heparin , heparin , ipratropium-albuterol , lidocaine  (PF), lidocaine -prilocaine , ondansetron  **OR** ondansetron  (ZOFRAN ) IV, mouth rinse, pentafluoroprop-tetrafluoroeth, polyethylene glycol   Bernardino Gasman  MD 10/28/2024, 10:55 AM

## 2024-10-28 NOTE — Progress Notes (Addendum)
 Responded to nursing call:  pt took midodrine  from home bottle. Patient has been intermittently confused throughout the hospitalization due to his sepsis/MRSA bacteremia. Pt called his brother to pick up a prescription of midodrine  that was just refilled at his pharmacy.  Pt's brother brought bottle to pt's bedside.  Pt felt as if he had not had his midodrine  during this hospitalization and decided to take his midodrine .  He inadvertently took 13 tabs (10mg ).     Subjective: Pt denies suicidal ideation.  Denies cp, sob, n/v/d, abd pain, headache  Vitals:   10/28/24 1335 10/28/24 1408 10/28/24 1446 10/28/24 1532  BP:  (!) 144/48 122/79 (!) 101/46  Pulse:      Resp: 16 (!) 21 (!) 21 20  Temp:      TempSrc:      SpO2:      Weight:      Height:       CV--RRR Lung--bibasilar rales Abd--soft+BS/NT   Assessment/Plan: Accidental midodrine  OD -remove all extraneous belongings from room; home bottle midodrine  obtained by nursing staff and placed in safe storage -poison control contacted>>>EKG now and in 6 hours and do supportive care monitor for bradycardia and give meds as needed for that.(Atropine or EPI gtt) bed rest for orthstatic changes. No charcoal due to pneumonia.  -with patient's intermittent confusion, all visitors will be screen -continue levophed  for now without wean -hold regular schedule doses of midodrine  already on [facility] -personally reviewed EKG--sinus HR 77, PR 184 ms, no STT changes     Alm Schneider, DO [facility]

## 2024-10-28 NOTE — Progress Notes (Signed)
 PROGRESS NOTE  [name] [name] FMW:[id number] DOB: 1961-10-30 DOA: 10/24/2024 PCP: [name], MD  Brief History:  63 y.o. male with medical history significant for ESRD on dialysis, atrial fibrillation, systolic CHF, hypertension, diabetes, LBBB.  Patient presented to the ED with complaints of difficulty breathing yesterday about 2 to 3 weeks ago.  He has a bad cough that has been going on for about 15 years, but has worsened from baseline.  Reported some dizziness over the past few days, present to respectable position, and intermittent.  He denies dizziness at this time.  No chest pain, no lower extremity swelling.  Reports he never smoked cigarettes.  His last dialysis was on Friday- 2/5.  He did not get dialyzed today, and has not taken his medications today which include midodrine .  He was in the ED 12/2 with reports of dysuria.  He makes very little urine.  He was started on a course of ciprofloxacin  which he started 12/3, and has been compliant.  He was also started on doxycycline  11/30 for chest infiltrate-thought more likely to be fluid related.   ED Course: Tmax 98.6.  Heart rate 66-91.  Respirate rate 16.  Blood pressure systolic initially 130s now down to 60s on my evaluation.  O2 sats greater 93% on room air.  Lactic acid 2.1.  WBC 12.9.  Sodium 130.  COVID influenza RSV negative.  Troponin 225 >> 216.  CT chest without contrast-multifocal bronchopneumonia, tiny right and moderate partially loculated left pleural effusion.  Collapse/consolidation in left lower lobe may be due to atelectasis, difficult to exclude pneumonia.  Nephrology consulted, no emergent need for dialysis.  IV ceftriaxone  and azithromycin  started.  500ml N/s bolus given.   Assessment/Plan: Severe sepsis with septic shock  - secondary to multifocal pneumonia and MRSA bacteremia - blood cultures positive for MRSA - continue IV vancomycin  - ID consultation noted - remains on IV norepinephrine  infusion  at 3 mcg/kg/min - wean off norepi   MRSA bacteremia  - 10/24/24 blood culture = MRSA - 10/26/24--blood culture = neg - HD cath removed on 10/28/24 - arterial line removed 10/28/24 - repeat blood cultures 12/13 am - 12/11 TTE--EF 20-25%, G2DD, mild decrease RVF; severe AS, no vegetation - pt will require line holiday on sat/sun   Left pleural effusion--transudate -10/26/2024--left thoracocentesis>> sent for cell count and cultures -does not appear to be empyema   Chronic hypotension  - he is normally on midodrine  TID - he now presents with sepsis physiology - he is on IV norepinephrine  now at 4 mcg/kg/min - wean off pressor as able  - midodrine  dose increased to 20 mg   ESRD on HD MWF - pt had inpatient HD 12/9 - consultation from nephrologist   Chronic HFrEF - volume management mostly with HD -- 12/11 TTE--EF 20-25%, G2DD, mild decrease RVF; severe AS, no vegetation - makes small amount of urine - compensated   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   Objective: Vitals:   10/28/24 1315 10/28/24 1330 10/28/24 1335 10/28/24 1408  BP:    (!) 144/48  Pulse:      Resp: (!) 25 16 16  (!) 21  Temp:      TempSrc:      SpO2:      Weight:      Height:        Intake/Output Summary (Last 24 hours) at 10/28/2024 1426 Last data filed at 10/28/2024 1220 Gross per 24 hour  Intake 343.77 ml   Output 2000 ml  Net -1656.23 ml   Weight change:  Exam:  General:  Pt is alert, follows commands appropriately, not in acute distress HEENT: No icterus, No thrush, No neck mass, [place]/AT Cardiovascular: RRR, S1/S2, no rubs, no gallops Respiratory: CTA bilaterally, no wheezing, no crackles, no rhonchi Abdomen: Soft/+BS, non tender, non distended, no guarding Extremities: No edema, No lymphangitis, No petechiae, No rashes, no synovitis--R-BKA site without erythema or drainage    Data Reviewed: I have personally reviewed following labs and imaging studies Basic Metabolic Panel: Recent Labs  Lab 10/24/24 1112 10/25/24 0442 10/28/24 0749  NA 130* 130* 130*  K 3.7 4.1 3.9  CL 87* 88* 90*  CO2 22 19* 25  GLUCOSE 105* 215* 234*  BUN 51* 63* 59*  CREATININE 10.50* 11.20* 10.90*  CALCIUM  9.6 9.2 7.8*  PHOS  --  8.0* 5.5*   Liver Function Tests: Recent Labs  Lab 10/24/24 1457 10/25/24 0442 10/28/24 0749  AST 23  --   --   ALT 10  --   --   ALKPHOS 118  --   --   BILITOT 2.4*  --   --   PROT 7.8  --   --   ALBUMIN  3.0* 3.0* 2.8*   No results for input(s): LIPASE, AMYLASE in the last 168 hours. No results for input(s): AMMONIA in the last 168 hours. Coagulation Profile: No results for input(s): INR, PROTIME in the last 168 hours. CBC: Recent Labs  Lab 10/24/24 1014 10/25/24 0442 10/28/24 0749  WBC 10.9* 16.1* 9.1  NEUTROABS 8.0*  --   --   HGB 14.0 12.6* 12.0*  HCT 43.1 37.8* 36.2*  MCV 75.3* 74.6* 73.9*  PLT 146* 185 208   Cardiac Enzymes: No results for input(s): CKTOTAL, CKMB, CKMBINDEX, TROPONINI in the last 168 hours. BNP: Invalid input(s): POCBNP CBG: Recent Labs  Lab 10/27/24 1631 10/27/24 2102 10/28/24 0246 10/28/24 0736 10/28/24 1114  GLUCAP 229* 260* 218* 241* 172*   HbA1C: No results for input(s): HGBA1C in the last 72 hours. Urine analysis:    Component Value Date/Time   COLORURINE AMBER (A) 07/01/2022 1143    APPEARANCEUR CLEAR 07/01/2022 1143   LABSPEC 1.017 07/01/2022 1143   PHURINE 7.0 07/01/2022 1143   GLUCOSEU NEGATIVE 07/01/2022 1143   HGBUR NEGATIVE 07/01/2022 1143   BILIRUBINUR NEGATIVE 07/01/2022 1143   KETONESUR NEGATIVE 07/01/2022 1143   PROTEINUR >=300 (A) 07/01/2022 1143   NITRITE NEGATIVE 07/01/2022 1143   LEUKOCYTESUR NEGATIVE 07/01/2022 1143   Sepsis Labs: @LABRCNTIP (procalcitonin:4,lacticidven:4) ) Recent Results (from the past 240 hours)  Resp panel by RT-PCR (RSV, Flu A&B, Covid) Anterior Nasal Swab     Status: None   Collection Time: 10/24/24 10:20 AM   Specimen: Anterior Nasal Swab  Result Value Ref Range Status   SARS Coronavirus 2 by RT PCR NEGATIVE NEGATIVE Final  Comment: (NOTE) SARS-CoV-2 target nucleic acids are NOT DETECTED.  The SARS-CoV-2 RNA is generally detectable in upper respiratory specimens during the acute phase of infection. The lowest concentration of SARS-CoV-2 viral copies this assay can detect is 138 copies/mL. A negative result does not preclude SARS-Cov-2 infection and should not be used as the sole basis for treatment or other patient management decisions. A negative result may occur with  improper specimen collection/handling, submission of specimen other than nasopharyngeal swab, presence of viral mutation(s) within the areas targeted by this assay, and inadequate number of viral copies(<138 copies/mL). A negative result must be combined with clinical observations, patient history, and epidemiological information. The expected result is Negative.  Fact Sheet for Patients:  bloggercourse.com  Fact Sheet for Healthcare Providers:  seriousbroker.it  This test is no t yet approved or cleared by the [place]  FDA and  has been authorized for detection and/or diagnosis of SARS-CoV-2 by FDA under an Emergency Use Authorization (EUA). This EUA will remain  in effect (meaning this test  can be used) for the duration of the COVID-19 declaration under Section 564(b)(1) of the Act, 21 U.S.C.section 360bbb-3(b)(1), unless the authorization is terminated  or revoked sooner.       Influenza A by PCR NEGATIVE NEGATIVE Final   Influenza B by PCR NEGATIVE NEGATIVE Final    Comment: (NOTE) The Xpert Xpress SARS-CoV-2/FLU/RSV plus assay is intended as an aid in the diagnosis of influenza from Nasopharyngeal swab specimens and should not be used as a sole basis for treatment. Nasal washings and aspirates are unacceptable for Xpert Xpress SARS-CoV-2/FLU/RSV testing.  Fact Sheet for Patients: bloggercourse.com  Fact Sheet for Healthcare Providers: seriousbroker.it  This test is not yet approved or cleared by the [place]  FDA and has been authorized for detection and/or diagnosis of SARS-CoV-2 by FDA under an Emergency Use Authorization (EUA). This EUA will remain in effect (meaning this test can be used) for the duration of the COVID-19 declaration under Section 564(b)(1) of the Act, 21 U.S.C. section 360bbb-3(b)(1), unless the authorization is terminated or revoked.     Resp Syncytial Virus by PCR NEGATIVE NEGATIVE Final    Comment: (NOTE) Fact Sheet for Patients: bloggercourse.com  Fact Sheet for Healthcare Providers: seriousbroker.it  This test is not yet approved or cleared by the [place]  FDA and has been authorized for detection and/or diagnosis of SARS-CoV-2 by FDA under an Emergency Use Authorization (EUA). This EUA will remain in effect (meaning this test can be used) for the duration of the COVID-19 declaration under Section 564(b)(1) of the Act, 21 U.S.C. section 360bbb-3(b)(1), unless the authorization is terminated or revoked.  Performed at [facility], [address] [place], [place] [address]   Culture, blood (routine x 2)     Status:  Abnormal   Collection Time: 10/24/24  2:07 PM   Specimen: BLOOD RIGHT FOREARM  Result Value Ref Range Status   Specimen Description   Final    BLOOD RIGHT FOREARM BOTTLES DRAWN AEROBIC AND ANAEROBIC Performed at [facility], [address], [place], [place] [address]    Special Requests   Final    Blood Culture results may not be optimal due to an inadequate volume of blood received in culture bottles Performed at [facility], [address], [place], [place] [address]    Culture  Setup Time   Final    IN BOTH AEROBIC AND ANAEROBIC BOTTLES GRAM POSITIVE COCCI IN CLUSTERS Gram Stain Report Called to,Read Back By and  Verified With: K. [facility] RN 10/25/24 @0709  BY J. WHITE Performed at [facility], [address], [place], [place] [address]    Culture (A)  Final    STAPHYLOCOCCUS AUREUS SUSCEPTIBILITIES PERFORMED ON PREVIOUS CULTURE WITHIN THE LAST 5 DAYS. Performed at [facility], [address], [place], [place] [address]    Report Status 10/27/2024 FINAL  Final  Culture, blood (routine x 2)     Status: Abnormal (Preliminary result)   Collection Time: 10/24/24  2:51 PM   Specimen: BLOOD  Result Value Ref Range Status   Specimen Description   Final    BLOOD BLOOD RIGHT ARM Performed at [facility], [address], [place], [place] [address]    Special Requests   Final    BOTTLES DRAWN AEROBIC AND ANAEROBIC Blood Culture adequate volume Performed at [facility], [address] [place], [place] [address]    Culture  Setup Time   Final    IN BOTH AEROBIC AND ANAEROBIC BOTTLES GRAM POSITIVE COCCI IN CLUSTERS Gram Stain Report Called to,Read Back By and Verified With: K. [facility] RN 10/25/24 @0709  BY J. WHITE CRITICAL RESULT CALLED TO, READ BACK BY AND VERIFIED WITH: PHARMD LAURIE POOLE 10/25/24 @1034  PR    Culture (A)  Final    METHICILLIN RESISTANT STAPHYLOCOCCUS AUREUS Sent to [facility] for further susceptibility testing. Performed at [facility], [address] [place], [place] [address]    Report Status PENDING  Incomplete   Organism ID, Bacteria METHICILLIN RESISTANT STAPHYLOCOCCUS AUREUS  Final      Susceptibility   Methicillin resistant staphylococcus aureus - MIC*    CIPROFLOXACIN  >=8 RESISTANT Resistant     ERYTHROMYCIN >=8 RESISTANT Resistant     GENTAMICIN <=0.5 SENSITIVE Sensitive     OXACILLIN >=4 RESISTANT Resistant     TETRACYCLINE >=16 RESISTANT Resistant     VANCOMYCIN  1 SENSITIVE Sensitive     TRIMETH/SULFA <=10 SENSITIVE Sensitive     CLINDAMYCIN >=8 RESISTANT Resistant     RIFAMPIN <=0.5 SENSITIVE Sensitive     Inducible Clindamycin NEGATIVE Sensitive     LINEZOLID 2 SENSITIVE Sensitive     * METHICILLIN RESISTANT STAPHYLOCOCCUS AUREUS  Blood Culture ID Panel (Reflexed)     Status: Abnormal   Collection Time: 10/24/24  2:51 PM  Result Value Ref Range Status   Enterococcus faecalis NOT DETECTED NOT DETECTED Final   Enterococcus Faecium NOT DETECTED NOT DETECTED Final   Listeria monocytogenes NOT DETECTED NOT DETECTED Final   Staphylococcus species DETECTED (A) NOT DETECTED Final    Comment: CRITICAL RESULT CALLED TO, READ BACK BY AND VERIFIED WITH: PHARMD LAURIE POOLE 10/25/24 @1034  PR    Staphylococcus aureus (BCID) DETECTED (A) NOT DETECTED Final    Comment: Methicillin (oxacillin)-resistant Staphylococcus aureus (MRSA). MRSA is predictably resistant to beta-lactam antibiotics (except ceftaroline). Preferred therapy is vancomycin  unless clinically contraindicated. Patient requires contact precautions if  hospitalized. CRITICAL RESULT CALLED TO, READ BACK BY AND VERIFIED WITH: PHARMD LAURIE POOLE 10/25/24 @1034  PR    Staphylococcus epidermidis NOT DETECTED NOT DETECTED Final   Staphylococcus lugdunensis NOT DETECTED NOT DETECTED Final   Streptococcus species NOT DETECTED NOT DETECTED Final   Streptococcus agalactiae NOT DETECTED NOT DETECTED Final   Streptococcus pneumoniae NOT DETECTED NOT DETECTED Final    Streptococcus pyogenes NOT DETECTED NOT DETECTED Final   A.calcoaceticus-baumannii NOT DETECTED NOT DETECTED Final   Bacteroides fragilis NOT DETECTED NOT DETECTED Final   Enterobacterales NOT DETECTED NOT DETECTED Final   Enterobacter  cloacae complex NOT DETECTED NOT DETECTED Final   Escherichia coli NOT DETECTED NOT DETECTED Final   Klebsiella aerogenes NOT DETECTED NOT DETECTED Final   Klebsiella oxytoca NOT DETECTED NOT DETECTED Final   Klebsiella pneumoniae NOT DETECTED NOT DETECTED Final   Proteus species NOT DETECTED NOT DETECTED Final   Salmonella species NOT DETECTED NOT DETECTED Final   Serratia marcescens NOT DETECTED NOT DETECTED Final   Haemophilus influenzae NOT DETECTED NOT DETECTED Final   Neisseria meningitidis NOT DETECTED NOT DETECTED Final   Pseudomonas aeruginosa NOT DETECTED NOT DETECTED Final   Stenotrophomonas maltophilia NOT DETECTED NOT DETECTED Final   Candida albicans NOT DETECTED NOT DETECTED Final   Candida auris NOT DETECTED NOT DETECTED Final   Candida glabrata NOT DETECTED NOT DETECTED Final   Candida krusei NOT DETECTED NOT DETECTED Final   Candida parapsilosis NOT DETECTED NOT DETECTED Final   Candida tropicalis NOT DETECTED NOT DETECTED Final   Cryptococcus neoformans/gattii NOT DETECTED NOT DETECTED Final   Meth resistant mecA/C and MREJ DETECTED (A) NOT DETECTED Final    Comment: CRITICAL RESULT CALLED TO, READ BACK BY AND VERIFIED WITH: PHARMD LAURIE POOLE 10/25/24 @1034  PR Performed at [facility], [address], [place], [place] [address]   MIC (1 Drug)-     Status: Abnormal (Preliminary result)   Collection Time: 10/24/24  2:51 PM  Result Value Ref Range Status   Min Inhibitory Conc (1 Drug) Preliminary report (A)  Final    Comment: (NOTE) Performed At: [facility] [address] [place], [place] [id number] Jennette Shorter MD Ey:[id number]    Source PENDING  Incomplete  MIC Result     Status: Abnormal   Collection Time:  10/24/24  2:51 PM  Result Value Ref Range Status   Result 1 (MIC) Comment (A)  Final    Comment: (NOTE) Methicillin - resistant Staphylococcus aureus Identification performed by account, not confirmed by this laboratory. DAPTOMYCIN Performed At: [facility] [address] [place], [place] [id number] Jennette Shorter MD Ey:[id number]   MRSA Next Gen by PCR, Nasal     Status: Abnormal   Collection Time: 10/24/24  4:33 PM   Specimen: Nasal Mucosa; Nasal Swab  Result Value Ref Range Status   MRSA by PCR Next Gen DETECTED (A) NOT DETECTED Final    Comment: RESULT CALLED TO, READ BACK BY AND VERIFIED WITH: C KINSLEY,RN@1950  1208/25 MK (NOTE) The GeneXpert MRSA Assay (FDA approved for NASAL specimens only), is one component of a comprehensive MRSA colonization surveillance program. It is not intended to diagnose MRSA infection nor to guide or monitor treatment for MRSA infections. Test performance is not FDA approved in patients less than 40 years old. Performed at [facility], [address], [place], [place] [address]   Culture, blood (Routine X 2) w Reflex to ID Panel     Status: None (Preliminary result)   Collection Time: 10/26/24  7:10 AM   Specimen: BLOOD  Result Value Ref Range Status   Specimen Description BLOOD RT ALINE  Final   Special Requests   Final    BOTTLES DRAWN AEROBIC ONLY Blood Culture results may not be optimal due to an inadequate volume of blood received in culture bottles   Culture   Final    NO GROWTH 2 DAYS Performed at [facility], [address], [place], [place] [address]    Report Status PENDING  Incomplete  Culture, blood (Routine X 2) w Reflex to ID Panel  Status: None (Preliminary result)   Collection Time: 10/26/24  7:10 AM   Specimen: BLOOD  Result Value Ref Range Status   Specimen Description BLOOD BLOOD RIGHT ARM  Final   Special Requests   Final    BOTTLES DRAWN AEROBIC AND ANAEROBIC Blood Culture adequate volume   Culture    Final    NO GROWTH 2 DAYS Performed at [facility], [address], [place], [place] [address]    Report Status PENDING  Incomplete  Gram stain     Status: None   Collection Time: 10/26/24  1:05 PM   Specimen: Pleura  Result Value Ref Range Status   Specimen Description PLEURAL LEFT  Final   Special Requests PLEURAL LEFT  Final   Gram Stain   Final    PLEURAL CYTOSPIN SMEAR NO ORGANISMS SEEN WBC PRESENT,BOTH PMN AND MONONUCLEAR Performed at [facility], [address], [place], [place] [address]    Report Status 10/26/2024 FINAL  Final  Culture, body fluid w Gram Stain-bottle     Status: None (Preliminary result)   Collection Time: 10/26/24  1:05 PM   Specimen: Pleura  Result Value Ref Range Status   Specimen Description PLEURAL  Final   Special Requests   Final    BOTTLES DRAWN AEROBIC AND ANAEROBIC Blood Culture adequate volume   Culture   Final    NO GROWTH 2 DAYS Performed at [facility], [address], [place], [place] [address]    Report Status PENDING  Incomplete     Scheduled Meds:  aspirin  EC  81 mg Oral Q breakfast   Chlorhexidine  Gluconate Cloth  6 each Topical Q0600   cinacalcet   60 mg Oral Q breakfast   gabapentin   300 mg Oral Daily   heparin   5,000 Units Subcutaneous Q8H   insulin  aspart  0-5 Units Subcutaneous QHS   insulin  aspart  0-9 Units Subcutaneous TID WC   midodrine   20 mg Oral TID   mupirocin  ointment  1 Application Nasal BID   sevelamer  carbonate  4,000 mg Oral TID WC   vancomycin  variable dose per unstable renal function (pharmacist dosing)   Does not apply See admin instructions   Continuous Infusions:  anticoagulant sodium citrate      norepinephrine  (LEVOPHED ) Adult infusion 4 mcg/min (10/27/24 2145)   vancomycin  1,000 mg (10/28/24 1359)    Procedures/Studies: ECHOCARDIOGRAM COMPLETE Result Date: 10/28/2024    ECHOCARDIOGRAM REPORT   Patient Name:   [name] WATTENBARGER Sr. Date of Exam: 10/27/2024 Medical Rec #:  [id number]         Height:        73.0 in Accession #:    [id number]        Weight:       230.2 lb Date of Birth:  [birth year]        BSA:          2.284 m Patient Age:    63 years          BP:           96/50 mmHg Patient Gender: M                 HR:           62 bpm. Exam Location:  [facility] Procedure: 2D Echo (Both Spectral and Color Flow Doppler were utilized during            procedure). Indications:    Bacteremia R78.81  History:  Patient has prior history of Echocardiogram examinations, most                 recent 11/09/2023. Cardiomyopathy and CHF, CAD and Previous                 Myocardial Infarction, Arrythmias:Atrial Flutter and LBBB; Risk                 Factors:Diabetes and Hypertension. ESRD on dialysis [facility]).  Sonographer:    Aida Pizza RCS Referring Phys: [id number] CORNELIUS N VAN DAM  Sonographer Comments: Global longitudinal strain was attempted. IMPRESSIONS  1. Left ventricular ejection fraction, by estimation, is 20 to 25%. The left ventricle has severely decreased function. The left ventricle demonstrates global hypokinesis. There is mild left ventricular hypertrophy. Left ventricular diastolic parameters  are consistent with Grade II diastolic dysfunction (pseudonormalization). Elevated left atrial pressure.  2. Right ventricular systolic function is mildly reduced. The right ventricular size is moderately enlarged. There is moderately elevated pulmonary artery systolic pressure.  3. The mitral valve is abnormal. Moderate mitral valve regurgitation. No evidence of mitral stenosis. Moderate mitral annular calcification.  4. The tricuspid valve is abnormal. Tricuspid valve regurgitation is moderate.  5. Severe low flow low gradient aortic stenosis. The aortic valve has an indeterminant number of cusps. There is severe calcifcation of the aortic valve. There is severe thickening of the aortic valve. Aortic valve regurgitation is mild to moderate. Severe aortic valve stenosis. Aortic valve area, by VTI measures 0.77  cm. Aortic valve mean gradient measures 24.0 mmHg. Aortic valve Vmax measures 3.20 m/s.  6. The inferior vena cava is dilated in size with <50% respiratory variability, suggesting right atrial pressure of 15 mmHg. FINDINGS  Left Ventricle: Left ventricular ejection fraction, by estimation, is 20 to 25%. The left ventricle has severely decreased function. The left ventricle demonstrates global hypokinesis. Strain was performed and the global longitudinal strain is indeterminate. The left ventricular internal cavity size was normal in size. There is mild left ventricular hypertrophy. Left ventricular diastolic parameters are consistent with Grade II diastolic dysfunction (pseudonormalization). Elevated left atrial pressure. Right Ventricle: The right ventricular size is moderately enlarged. Right vetricular wall thickness was not well visualized. Right ventricular systolic function is mildly reduced. There is moderately elevated pulmonary artery systolic pressure. The tricuspid regurgitant velocity is 3.11 m/s, and with an assumed right atrial pressure of 15 mmHg, the estimated right ventricular systolic pressure is 53.7 mmHg. Left Atrium: Left atrial size was normal in size. Right Atrium: Right atrial size was normal in size. Pericardium: There is no evidence of pericardial effusion. Mitral Valve: The MR vena contracta is 0.4 cm. The mitral valve is abnormal. There is moderate thickening of the mitral valve leaflet(s). There is moderate calcification of the mitral valve leaflet(s). Moderate mitral annular calcification. Moderate mitral valve regurgitation. No evidence of mitral valve stenosis. Tricuspid Valve: The tricuspid valve is abnormal. Tricuspid valve regurgitation is moderate . No evidence of tricuspid stenosis. Aortic Valve: Severe low flow low gradient aortic stenosis. The aortic valve has an indeterminant number of cusps. There is severe calcifcation of the aortic valve. There is severe thickening of the  aortic valve. There is severe aortic valve annular calcification. Aortic valve regurgitation is mild to moderate. Aortic regurgitation PHT measures 435 msec. Severe aortic stenosis is present. Aortic valve mean gradient measures 24.0 mmHg. Aortic valve peak gradient measures 41.0 mmHg. Aortic valve area,  by VTI measures 0.77 cm. Pulmonic Valve: The pulmonic valve was  not well visualized. Pulmonic valve regurgitation is mild. No evidence of pulmonic stenosis. Aorta: The aortic root is normal in size and structure. Venous: The inferior vena cava is dilated in size with less than 50% respiratory variability, suggesting right atrial pressure of 15 mmHg. IAS/Shunts: No atrial level shunt detected by color flow Doppler. Additional Comments: 3D was performed not requiring image post processing on an independent workstation and was abnormal.  LEFT VENTRICLE PLAX 2D LVIDd:         5.75 cm LVIDs:         5.30 cm LV PW:         1.10 cm LV IVS:        1.10 cm LVOT diam:     2.00 cm   3D Volume EF: LV SV:         49        3D EF:        38 % LV SV Index:   21        LV EDV:       217 ml LVOT Area:     3.14 cm  LV ESV:       134 ml                          LV SV:        83 ml RIGHT VENTRICLE TAPSE (M-mode): 1.4 cm LEFT ATRIUM             Index        RIGHT ATRIUM           Index LA diam:        4.90 cm 2.15 cm/m   RA Area:     18.70 cm LA Vol (A2C):   92.0 ml 40.28 ml/m  RA Volume:   56.00 ml  24.52 ml/m LA Vol (A4C):   70.6 ml 30.91 ml/m LA Biplane Vol: 81.3 ml 35.60 ml/m  AORTIC VALVE AV Area (Vmax):    0.69 cm AV Area (Vmean):   0.64 cm AV Area (VTI):     0.77 cm AV Vmax:           320.00 cm/s AV Vmean:          232.000 cm/s AV VTI:            0.635 m AV Peak Grad:      41.0 mmHg AV Mean Grad:      24.0 mmHg LVOT Vmax:         70.30 cm/s LVOT Vmean:        47.600 cm/s LVOT VTI:          0.155 m LVOT/AV VTI ratio: 0.24 AI PHT:            435 msec  AORTA Ao Root diam: 3.70 cm MITRAL VALVE                  TRICUSPID  VALVE MV Area (PHT): 4.60 cm       TR Peak grad:   38.7 mmHg MV Decel Time: 165 msec       TR Vmax:        311.00 cm/s MR Peak grad:    108.2 mmHg MR Mean grad:    64.0 mmHg    SHUNTS MR Vmax:         520.00 cm/s  Systemic VTI:  0.16 m MR Vmean:  370.0 cm/s   Systemic Diam: 2.00 cm MR PISA:         1.90 cm MR PISA Eff ROA: 11 mm MR PISA Radius:  0.55 cm MV E velocity: 145.00 cm/s MV A velocity: 70.20 cm/s MV E/A ratio:  2.07 Dorn Ross MD Electronically signed by Dorn Ross MD Signature Date/Time: 10/28/2024/2:06:38 PM    Final    US  THORACENTESIS ASP PLEURAL SPACE W/IMG GUIDE Result Date: 10/26/2024 INDICATION: Patient admitted 10/24/2024 with severe sepsis, MRSA bacteremia and new left pleural effusion. Request for diagnostic and therapeutic left thoracentesis. EXAM: ULTRASOUND GUIDED LEFT THORACENTESIS MEDICATIONS: 6 mL 1% lidocaine  COMPLICATIONS: None immediate. PROCEDURE: An ultrasound guided thoracentesis was thoroughly discussed with the patient and questions answered. The benefits, risks, alternatives and complications were also discussed. The patient understands and wishes to proceed with the procedure. Written consent was obtained. Ultrasound was performed to localize and mark an adequate pocket of fluid in the left chest. The area was then prepped and draped in the normal sterile fashion. 1% Lidocaine  was used for local anesthesia. Under ultrasound guidance a 19 gauge, 7-cm, Yueh catheter was introduced. Thoracentesis was performed. The catheter was removed and a dressing applied. FINDINGS: A total of approximately 450 mL of clear yellow fluid was removed. Samples were sent to the laboratory as requested by the clinical team. IMPRESSION: Successful ultrasound guided left thoracentesis yielding 450 mL of pleural fluid. Performed by Clotilda Hesselbach, PA-C Electronically Signed   By: Ester Sides M.D.   On: 10/26/2024 16:13   DG Chest 1 View Result Date: 10/26/2024 EXAM: 1  VIEW(S) XRAY OF THE CHEST 10/26/2024 01:55:07 PM COMPARISON: 2 days ago. CLINICAL HISTORY: 758137 Status post thoracentesis 758137 Status post thoracentesis. FINDINGS: LINES, TUBES AND DEVICES: Stable left internal jugular dialysis catheter. LUNGS AND PLEURA: New right upper lobe opacity is noted concerning for possible infiltrate. Stable left basilar atelectasis and possible small pleural effusion is noted. No pneumothorax is noted. HEART AND MEDIASTINUM: Stable cardiomegaly. BONES AND SOFT TISSUES: No acute osseous abnormality. IMPRESSION: 1. New right upper lobe opacity, concerning for possible pneumonia. 2. Stable left basilar atelectasis with possible small pleural effusion. Electronically signed by: [name] Seip MD 10/26/2024 02:38 PM EST RP Workstation: [id number]   CT Chest Wo Contrast Result Date: 10/24/2024 CLINICAL DATA:  Pneumonia. Shortness of breath after dialysis Friday. EXAM: CT CHEST WITHOUT CONTRAST TECHNIQUE: Multidetector CT imaging of the chest was performed following the standard protocol without IV contrast. RADIATION DOSE REDUCTION: This exam was performed according to the departmental dose-optimization program which includes automated exposure control, adjustment of the mA and/or kV according to patient size and/or use of iterative reconstruction technique. COMPARISON:  11/08/2023. FINDINGS: Cardiovascular: Atherosclerotic calcification of the aorta, aortic valve and coronary arteries. Enlarged pulmonic trunk and heart. No pericardial effusion. Left IJ dialysis catheter terminates in the right atrium. Mediastinum/Nodes: No pathologically enlarged mediastinal or axillary lymph nodes. Hilar regions are difficult to definitively evaluate without IV contrast. Esophagus is grossly unremarkable. Lungs/Pleura: Image quality is degraded by expiratory phase imaging and respiratory motion. Peribronchovascular ground-glass and consolidation in the anterior segment right upper lobe and likely  elsewhere within the right middle and right lower lobes. Tiny right and moderate left pleural effusion, the latter of which is partially loculated. Compressive collapse/consolidation in the left lower lobe and to a lesser extent, lingula. There may be tracheobronchomalacia. Upper Abdomen: Liver margin is slightly irregular. Bilateral renal stones and/or renal vascular calcifications. Visualized portions of the liver, gallbladder, adrenal glands, kidneys,  spleen, pancreas, stomach and bowel are otherwise grossly unremarkable. No upper abdominal adenopathy. Slight nonspecific haziness in the left upper quadrant omentum. Musculoskeletal: Degenerative changes in the spine. Old sternal fracture. IMPRESSION: 1. Multilobar bronchopneumonia. 2. Tiny right and moderate partially loculated left pleural effusions. Collapse/consolidation in the left lower lobe may be due to atelectasis. Difficult to exclude pneumonia. 3. 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 4. Liver margin is slightly irregular, raising suspicion for cirrhosis. 5. Bilateral renal stones and/or renal vascular calcifications. 6. Aortic atherosclerosis (ICD10-I70.0). Coronary artery calcification. 7. Enlarged pulmonic trunk, indicative of pulmonary arterial hypertension. Electronically Signed   By: Newell Eke M.D.   On: 10/24/2024 14:15   DG Chest Port 1 View Result Date: 10/24/2024 CLINICAL DATA:  Shortness of breath. EXAM: PORTABLE CHEST 1 VIEW COMPARISON:  10/16/2024 FINDINGS: Low volume film. The cardio pericardial silhouette is enlarged. Retrocardiac collapse/consolidation with effusion is similar to prior. No pulmonary edema. Left IJ central line tip overlies the mid SVC level. IMPRESSION: Low volume film with persistent retrocardiac collapse/consolidation and effusion. Electronically Signed   By: Camellia Candle M.D.   On: 10/24/2024 11:31   DG Chest Port 1 View Result Date: 10/16/2024 EXAM: 1 VIEW(S) XRAY OF THE CHEST 10/16/2024 12:11:00 PM COMPARISON:  01/13/2024 CLINICAL HISTORY: sob FINDINGS: LINES, TUBES AND DEVICES: Tunneled left IJ hemodialysis catheter with tip at superior cavoatrial junction. LUNGS AND PLEURA: Low lung volumes. New left retrocardiac airspace opacity. Possible small left pleural effusion. No pneumothorax. HEART AND MEDIASTINUM: The cardiac silhouette is unremarkable. Aortic atherosclerosis is present. BONES AND SOFT TISSUES: No acute osseous abnormality. IMPRESSION: 1. New left retrocardiac airspace opacity and possible small left pleural effusion. 2. Low lung volumes. Electronically signed by: Evalene Coho MD 10/16/2024 12:16 PM EST RP Workstation: DARYLENE Alm Schneider, DO  [facility]  If 7PM-7AM, please contact night-coverage [email or web address] Password [email or web address] 10/28/2024, 2:26 PM   LOS: 4 days

## 2024-10-28 NOTE — Inpatient Diabetes Management (Signed)
 Inpatient Diabetes Program Recommendations  AACE/ADA: New Consensus Statement on Inpatient Glycemic Control   Target Ranges:  Prepandial:   less than 140 mg/dL      Peak postprandial:   less than 180 mg/dL (1-2 hours)      Critically ill patients:  140 - 180 mg/dL   Lab Results  Component Value Date   GLUCAP 241 (H) 10/28/2024   HGBA1C 8.6 (H) 10/24/2024    Latest Reference Range & Units 10/27/24 07:42 10/27/24 11:10 10/27/24 16:31 10/27/24 21:02 10/28/24 02:46 10/28/24 07:36  Glucose-Capillary 70 - 99 mg/dL 787 (H) 808 (H) 770 (H) 260 (H) 218 (H) 241 (H)   Review of Glycemic Control  Diabetes history: DM2  Outpatient Diabetes medications:  70/30 30 units in the morning and 30 units in the evening   Current orders for Inpatient glycemic control:  Novolog  0-9 units TID + 0-5 units at bedtime   Inpatient Diabetes Program Recommendations:   Please consider starting Semglee  10 units daily.   Thanks,  Lavanda Search, RN, MSN, [name]  Inpatient Diabetes Coordinator  Pager [phone] (8a-5p)

## 2024-10-28 NOTE — Progress Notes (Signed)
 1548 called poison control for patient taking 13 pills 10 mg from a family member bring him home medications. Recommendations are getting EKG now and in 6 hours and do supportive care monitor for bradycardia and give meds as needed for that.(Atropine or EPI gtt) bed rest for orthstatic changes. No charcoal due to pneumonia.

## 2024-10-28 NOTE — Procedures (Signed)
 [facility] Procedure Note   10/28/2024   Pre-procedure Diagnosis: Tunneled dialysis catheter in place, MRSA bacteremia   Post-procedure Diagnosis: Same   Procedure(s) Performed: Removal of left EJ tunneled dialysis catheter   Surgeon: Dorothyann Brittle, DO   Assistants: Edmonia Gosling, RN   Anesthesia: Lidocaine  1%    Specimens:  None    Estimated Blood Loss: Minimal   Wound Class: Contaminated, Infected secondary to MRSA bacteremia    Procedure Indications: Kourosh Jablonsky, Sr. is a 63 y.o. with a left chest tunneled dialysis catheter in place. He needs the catheter removed secondary to MRSA bacteremia removed. We discussed removal and risk of bleeding, infection, incomplete removal, pneumothorax. He opted to proceed.    Findings: Normal appearing tunneled dialysis catheter removed completely, hemostasis noted   Procedure: The procedure was performed in the ICU.  The patient was placed in a supine. The left chest, neck, and catheter were prepared and draped in the usual sterile fashion. Lidocaine  1% was injected along the catheter.     Using sharp and blunt dissection, the cuff from the tunneled dialysis catheter was dissected from the surrounding subcutaneous tissues.  Once the cuff was completely released from the subcutaneous tissues, the catheter was able to be removed intact without issue. My assistant held pressure for 10 minutes at the external jugular entry site. The catheter exit site was dressed with 4 x 4 and Medipore tape   Final inspection revealed acceptable hemostasis. The patient tolerated the procedure well.    Dorothyann Brittle, DO [facility] [address], [place] [address] [phone] (office)

## 2024-10-28 NOTE — Procedures (Addendum)
 Pt dialyzed in ICU unit in bed.  Alert and oriented.  Informed consent signed and in chart.   TX duration: 4 hrs  Patient tolerated well.  Transported back to the room  Alert, without acute distress.  Hand-off given to patient's nurse.   Access used: L Chest catheter Access issues: None  Total UF removed: 2L Medication(s) given: None Post HD weight: 105.3kg Post HD VS: 105/76   Slayden Mennenga S [facility]

## 2024-10-28 NOTE — Care Management Important Message (Signed)
 Important Message  Patient Details  Name: [name] HASSELL Sr. MRN: [id number] Date of Birth: Apr 07, 1961   Important Message Given:  Yes - Medicare IM     [name] [name] 10/28/2024, 2:37 PM

## 2024-10-28 NOTE — Progress Notes (Signed)
 [facility] Progress Note     Subjective: [name] [name] seen and examined.  [name] [name] is resting comfortable in bed.  [name] [name] tolerated dialysis without issue.  [name] [name] has no complaints regarding his left EJ tunneled dialysis catheter.  Objective: Vital signs in last 24 hours: Temp:  [97.4 F (36.3 C)-98 F (36.7 C)] 97.6 F (36.4 C) (12/12 1220) Pulse Rate:  [83-89] 88 (12/12 1220) Resp:  [15-30] 20 (12/12 1220) BP: (93-124)/(57-96) 105/76 (12/12 1220) SpO2:  [93 %-100 %] 95 % (12/12 1220) Arterial Line BP: (76-154)/(36-112) 111/71 (12/12 1145) Weight:  [105.3 kg-107.2 kg] 105.3 kg (12/12 1220) Last BM Date : 10/27/24  Intake/Output from previous day: 12/11 0701 - 12/12 0700 In: 820.3 [P.O.:330; I.V.:490.3] Out: 0  Intake/Output this shift: Total I/O In: -  Out: 2000 [Other:2000]  General appearance: alert, cooperative, and no distress Chest wall: Left chest tunneled dialysis catheter in place with dressing, no surrounding erythema or induration, nontender to palpation  Lab Results:  Recent Labs    10/28/24 0749  WBC 9.1  HGB 12.0*  HCT 36.2*  PLT 208   BMET Recent Labs    10/28/24 0749  NA 130*  K 3.9  CL 90*  CO2 25  GLUCOSE 234*  BUN 59*  CREATININE 10.90*  CALCIUM  7.8*   PT/INR No results for input(s): LABPROT, INR in the last 72 hours.  Studies/Results: US  THORACENTESIS ASP PLEURAL SPACE W/IMG GUIDE Result Date: 10/26/2024 INDICATION: [name] [name] admitted 10/24/2024 with severe sepsis, MRSA bacteremia and new left pleural effusion. Request for diagnostic and therapeutic left thoracentesis. EXAM: ULTRASOUND GUIDED LEFT THORACENTESIS MEDICATIONS: 6 mL 1% lidocaine  COMPLICATIONS: None immediate. PROCEDURE: An ultrasound guided thoracentesis was thoroughly discussed with the [name] [name] and questions answered. The benefits, risks, alternatives and complications were also discussed. The [name] [name] understands and wishes to proceed with the procedure. Written consent  was obtained. Ultrasound was performed to localize and mark an adequate pocket of fluid in the left chest. The area was then prepped and draped in the normal sterile fashion. 1% Lidocaine  was used for local anesthesia. Under ultrasound guidance a 19 gauge, 7-cm, Yueh catheter was introduced. Thoracentesis was performed. The catheter was removed and a dressing applied. FINDINGS: A total of approximately 450 mL of clear yellow fluid was removed. Samples were sent to the laboratory as requested by the clinical team. IMPRESSION: Successful ultrasound guided left thoracentesis yielding 450 mL of pleural fluid. Performed by Clotilda Hesselbach, PA-C Electronically Signed   By: Ester Sides M.D.   On: 10/26/2024 16:13   DG Chest 1 View Result Date: 10/26/2024 EXAM: 1 VIEW(S) XRAY OF THE CHEST 10/26/2024 01:55:07 PM COMPARISON: 2 days ago. CLINICAL HISTORY: 758137 Status post thoracentesis 758137 Status post thoracentesis. FINDINGS: LINES, TUBES AND DEVICES: Stable left internal jugular dialysis catheter. LUNGS AND PLEURA: New right upper lobe opacity is noted concerning for possible infiltrate. Stable left basilar atelectasis and possible small pleural effusion is noted. No pneumothorax is noted. HEART AND MEDIASTINUM: Stable cardiomegaly. BONES AND SOFT TISSUES: No acute osseous abnormality. IMPRESSION: 1. New right upper lobe opacity, concerning for possible pneumonia. 2. Stable left basilar atelectasis with possible small pleural effusion. Electronically signed by: Lynwood Seip MD 10/26/2024 02:38 PM EST RP Workstation: [id number]    Anti-infectives: Anti-infectives (From admission, onward)    Start     Dose/Rate Route Frequency Ordered Stop   10/28/24 1400  vancomycin  (VANCOCIN ) IVPB 1000 mg/200 mL premix        1,000 mg 200 mL/hr over 60 Minutes  Intravenous Every M-W-F (Hemodialysis) 10/28/24 0814     10/27/24 1719  vancomycin  variable dose per unstable renal function (pharmacist dosing)         Does not  apply See admin instructions 10/27/24 1719     10/25/24 1800  ceFEPIme  (MAXIPIME ) 1 g in sodium chloride  0.9 % 100 mL IVPB  Status:  Discontinued        1 g 200 mL/hr over 30 Minutes Intravenous Every 24 hours 10/24/24 1642 10/25/24 0906   10/25/24 1600  ceFEPIme  (MAXIPIME ) 2 g in sodium chloride  0.9 % 100 mL IVPB  Status:  Discontinued        2 g 200 mL/hr over 30 Minutes Intravenous Every T-Th-Sa (Hemodialysis) 10/25/24 0906 10/25/24 1323   10/25/24 1600  vancomycin  (VANCOCIN ) IVPB 1000 mg/200 mL premix  Status:  Discontinued        1,000 mg 200 mL/hr over 60 Minutes Intravenous Every T-Th-Sa (Hemodialysis) 10/25/24 0908 10/27/24 1719   10/24/24 1800  ceFEPIme  (MAXIPIME ) 2 g in sodium chloride  0.9 % 100 mL IVPB        2 g 200 mL/hr over 30 Minutes Intravenous  Once 10/24/24 1638 10/24/24 1720   10/24/24 1730  vancomycin  (VANCOREADY) IVPB 2000 mg/400 mL        2,000 mg 200 mL/hr over 120 Minutes Intravenous  Once 10/24/24 1638 10/24/24 1926   10/24/24 1639  vancomycin  variable dose per unstable renal function (pharmacist dosing)  Status:  Discontinued         Does not apply See admin instructions 10/24/24 1639 10/26/24 0843   10/24/24 1430  cefTRIAXone  (ROCEPHIN ) 1 g in sodium chloride  0.9 % 100 mL IVPB        1 g 200 mL/hr over 30 Minutes Intravenous  Once 10/24/24 1419 10/24/24 1536   10/24/24 1430  azithromycin  (ZITHROMAX ) 500 mg in sodium chloride  0.9 % 250 mL IVPB        500 mg 250 mL/hr over 60 Minutes Intravenous  Once 10/24/24 1419 10/24/24 1724       Assessment/Plan:  [name] [name] is a 63 year old male who was admitted with multifocal pneumonia and MRSA bacteremia.  General surgery was consulted for removal of his tunneled dialysis catheter.  -Again discussed the risks and benefits of tunneled hemodialysis catheter, including but not limited to bleeding, infection, and need for additional procedures.  After careful consideration, [name] [name], Sr. has decided to proceed with  the procedure -Please refer to separate note for details of the procedure -Dressing placed on catheter exit site -Plan for line holiday for 2 days over the weekend -IR/vascular surgery will need to replace tunneled dialysis catheter on Monday -Care per hospitalist, ID, and nephrology   LOS: 4 days    Graydon Fofana A Breionna Punt 10/28/2024  Note: Portions of this report may have been transcribed using voice recognition software. Every effort has been made to ensure accuracy; however, inadvertent computerized transcription errors may still be present.

## 2024-10-28 NOTE — Progress Notes (Addendum)
 Virtual Visit via Video Note  I connected with [name] BAETEN Sr. on 12/12/2025t  by a video enabled telemedicine application and verified that I am speaking with the correct person using two identifiers.  Location: Patient: ICU 08 [name] Provider: [facility]   I discussed the limitations of evaluation and management by telemedicine and the availability of in person appointments. The patient expressed understanding and agreed to proceed.  Subjective:  No new pain or complaints   Antibiotics:  Anti-infectives (From admission, onward)    Start     Dose/Rate Route Frequency Ordered Stop   10/28/24 1400  vancomycin  (VANCOCIN ) IVPB 1000 mg/200 mL premix        1,000 mg 200 mL/hr over 60 Minutes Intravenous Every M-W-F (Hemodialysis) 10/28/24 0814     10/27/24 1719  vancomycin  variable dose per unstable renal function (pharmacist dosing)         Does not apply See admin instructions 10/27/24 1719     10/25/24 1800  ceFEPIme  (MAXIPIME ) 1 g in sodium chloride  0.9 % 100 mL IVPB  Status:  Discontinued        1 g 200 mL/hr over 30 Minutes Intravenous Every 24 hours 10/24/24 1642 10/25/24 0906   10/25/24 1600  ceFEPIme  (MAXIPIME ) 2 g in sodium chloride  0.9 % 100 mL IVPB  Status:  Discontinued        2 g 200 mL/hr over 30 Minutes Intravenous Every T-Th-Sa (Hemodialysis) 10/25/24 0906 10/25/24 1323   10/25/24 1600  vancomycin  (VANCOCIN ) IVPB 1000 mg/200 mL premix  Status:  Discontinued        1,000 mg 200 mL/hr over 60 Minutes Intravenous Every T-Th-Sa (Hemodialysis) 10/25/24 0908 10/27/24 1719   10/24/24 1800  ceFEPIme  (MAXIPIME ) 2 g in sodium chloride  0.9 % 100 mL IVPB        2 g 200 mL/hr over 30 Minutes Intravenous  Once 10/24/24 1638 10/24/24 1720   10/24/24 1730  vancomycin  (VANCOREADY) IVPB 2000 mg/400 mL        2,000 mg 200 mL/hr over 120 Minutes Intravenous  Once 10/24/24 1638 10/24/24 1926   10/24/24 1639  vancomycin  variable dose per unstable renal function  (pharmacist dosing)  Status:  Discontinued         Does not apply See admin instructions 10/24/24 1639 10/26/24 0843   10/24/24 1430  cefTRIAXone  (ROCEPHIN ) 1 g in sodium chloride  0.9 % 100 mL IVPB        1 g 200 mL/hr over 30 Minutes Intravenous  Once 10/24/24 1419 10/24/24 1536   10/24/24 1430  azithromycin  (ZITHROMAX ) 500 mg in sodium chloride  0.9 % 250 mL IVPB        500 mg 250 mL/hr over 60 Minutes Intravenous  Once 10/24/24 1419 10/24/24 1724       Medications: Scheduled Meds:  aspirin  EC  81 mg Oral Q breakfast   Chlorhexidine  Gluconate Cloth  6 each Topical Q0600   cinacalcet   60 mg Oral Q breakfast   gabapentin   300 mg Oral Daily   heparin   5,000 Units Subcutaneous Q8H   insulin  aspart  0-5 Units Subcutaneous QHS   insulin  aspart  0-9 Units Subcutaneous TID WC   midodrine   20 mg Oral TID   mupirocin  ointment  1 Application Nasal BID   sevelamer  carbonate  4,000 mg Oral TID WC   vancomycin  variable dose per unstable renal function (pharmacist dosing)   Does not apply See admin instructions   Continuous  Infusions:  anticoagulant sodium citrate      norepinephrine  (LEVOPHED ) Adult infusion 4 mcg/min (10/27/24 2145)   vancomycin  1,000 mg (10/28/24 1359)   PRN Meds:.acetaminophen  **OR** acetaminophen , alteplase , anticoagulant sodium citrate , heparin , heparin , ipratropium-albuterol , lidocaine  (PF), lidocaine -prilocaine , ondansetron  **OR** ondansetron  (ZOFRAN ) IV, mouth rinse, pentafluoroprop-tetrafluoroeth, polyethylene glycol    Objective: Weight change:   Intake/Output Summary (Last 24 hours) at 10/28/2024 1541 Last data filed at 10/28/2024 1220 Gross per 24 hour  Intake 343.77 ml  Output 2000 ml  Net -1656.23 ml   Blood pressure 122/79, pulse 88, temperature 97.6 F (36.4 C), resp. rate (!) 21, height 6' 1 (1.854 m), weight 105.3 kg, SpO2 95%. Temp:  [97.4 F (36.3 C)-98 F (36.7 C)] 97.6 F (36.4 C) (12/12 1220) Pulse Rate:  [83-89] 88 (12/12 1220) Resp:   [15-30] 21 (12/12 1446) BP: (93-144)/(48-96) 122/79 (12/12 1446) SpO2:  [93 %-100 %] 95 % (12/12 1220) Arterial Line BP: (76-154)/(36-112) 117/62 (12/12 1335) Weight:  [105.3 kg-107.2 kg] 105.3 kg (12/12 1220)  Physical Exam: Physical Exam Constitutional:      Appearance: Normal appearance.  HENT:     Head: Normocephalic and atraumatic.  Eyes:     General:        Right eye: No discharge.        Left eye: No discharge.     Extraocular Movements: Extraocular movements intact.  Cardiovascular:     Rate and Rhythm: Normal rate and regular rhythm.  Pulmonary:     Effort: No respiratory distress.     Breath sounds: No wheezing.  Abdominal:     General: There is no distension.  Musculoskeletal:        General: Normal range of motion.     Cervical back: Normal range of motion and neck supple.  Skin:    General: Skin is warm and dry.  Neurological:     General: No focal deficit present.     Mental Status: He is alert and oriented to person, place, and time.  Psychiatric:        Mood and Affect: Mood normal.        Behavior: Behavior normal.        Thought Content: Thought content normal.        Judgment: Judgment normal.      CBC:    BMET Recent Labs    10/28/24 0749  NA 130*  K 3.9  CL 90*  CO2 25  GLUCOSE 234*  BUN 59*  CREATININE 10.90*  CALCIUM  7.8*     Liver Panel  Recent Labs    10/28/24 0749  ALBUMIN  2.8*       Sedimentation Rate No results for input(s): ESRSEDRATE in the last 72 hours. C-Reactive Protein No results for input(s): CRP in the last 72 hours.  Micro Results: Recent Results (from the past 720 hours)  Resp panel by RT-PCR (RSV, Flu A&B, Covid) Anterior Nasal Swab     Status: None   Collection Time: 10/24/24 10:20 AM   Specimen: Anterior Nasal Swab  Result Value Ref Range Status   SARS Coronavirus 2 by RT PCR NEGATIVE NEGATIVE Final    Comment: (NOTE) SARS-CoV-2 target nucleic acids are NOT DETECTED.  The SARS-CoV-2 RNA  is generally detectable in upper respiratory specimens during the acute phase of infection. The lowest concentration of SARS-CoV-2 viral copies this assay can detect is 138 copies/mL. A negative result does not preclude SARS-Cov-2 infection and should not be used as the sole basis for treatment or other  patient management decisions. A negative result may occur with  improper specimen collection/handling, submission of specimen other than nasopharyngeal swab, presence of viral mutation(s) within the areas targeted by this assay, and inadequate number of viral copies(<138 copies/mL). A negative result must be combined with clinical observations, patient history, and epidemiological information. The expected result is Negative.  Fact Sheet for Patients:  bloggercourse.com  Fact Sheet for Healthcare Providers:  seriousbroker.it  This test is no t yet approved or cleared by the [place]  FDA and  has been authorized for detection and/or diagnosis of SARS-CoV-2 by FDA under an Emergency Use Authorization (EUA). This EUA will remain  in effect (meaning this test can be used) for the duration of the COVID-19 declaration under Section 564(b)(1) of the Act, 21 U.S.C.section 360bbb-3(b)(1), unless the authorization is terminated  or revoked sooner.       Influenza A by PCR NEGATIVE NEGATIVE Final   Influenza B by PCR NEGATIVE NEGATIVE Final    Comment: (NOTE) The Xpert Xpress SARS-CoV-2/FLU/RSV plus assay is intended as an aid in the diagnosis of influenza from Nasopharyngeal swab specimens and should not be used as a sole basis for treatment. Nasal washings and aspirates are unacceptable for Xpert Xpress SARS-CoV-2/FLU/RSV testing.  Fact Sheet for Patients: bloggercourse.com  Fact Sheet for Healthcare Providers: seriousbroker.it  This test is not yet approved or cleared by the  [place]  FDA and has been authorized for detection and/or diagnosis of SARS-CoV-2 by FDA under an Emergency Use Authorization (EUA). This EUA will remain in effect (meaning this test can be used) for the duration of the COVID-19 declaration under Section 564(b)(1) of the Act, 21 U.S.C. section 360bbb-3(b)(1), unless the authorization is terminated or revoked.     Resp Syncytial Virus by PCR NEGATIVE NEGATIVE Final    Comment: (NOTE) Fact Sheet for Patients: bloggercourse.com  Fact Sheet for Healthcare Providers: seriousbroker.it  This test is not yet approved or cleared by the [place]  FDA and has been authorized for detection and/or diagnosis of SARS-CoV-2 by FDA under an Emergency Use Authorization (EUA). This EUA will remain in effect (meaning this test can be used) for the duration of the COVID-19 declaration under Section 564(b)(1) of the Act, 21 U.S.C. section 360bbb-3(b)(1), unless the authorization is terminated or revoked.  Performed at [facility], [address], [place], [place] [address]   Culture, blood (routine x 2)     Status: Abnormal   Collection Time: 10/24/24  2:07 PM   Specimen: BLOOD RIGHT FOREARM  Result Value Ref Range Status   Specimen Description   Final    BLOOD RIGHT FOREARM BOTTLES DRAWN AEROBIC AND ANAEROBIC Performed at [facility], [address] [place], [place] [address]    Special Requests   Final    Blood Culture results may not be optimal due to an inadequate volume of blood received in culture bottles Performed at [facility], [address], [place], [place] [address]    Culture  Setup Time   Final    IN BOTH AEROBIC AND ANAEROBIC BOTTLES GRAM POSITIVE COCCI IN CLUSTERS Gram Stain Report Called to,Read Back By and Verified With: K. [facility] RN 10/25/24 @0709  BY J. WHITE Performed at [facility], [address], [place], [place] [address]    Culture (A)  Final     STAPHYLOCOCCUS AUREUS SUSCEPTIBILITIES PERFORMED ON PREVIOUS CULTURE WITHIN THE LAST 5 DAYS. Performed at [facility], [address], [place], [place] [address]    Report Status  10/27/2024 FINAL  Final  Culture, blood (routine x 2)     Status: Abnormal (Preliminary result)   Collection Time: 10/24/24  2:51 PM   Specimen: BLOOD  Result Value Ref Range Status   Specimen Description   Final    BLOOD BLOOD RIGHT ARM Performed at [facility], [address], [place], [place] [address]    Special Requests   Final    BOTTLES DRAWN AEROBIC AND ANAEROBIC Blood Culture adequate volume Performed at [facility], [address] [place], [place] [address]    Culture  Setup Time   Final    IN BOTH AEROBIC AND ANAEROBIC BOTTLES GRAM POSITIVE COCCI IN CLUSTERS Gram Stain Report Called to,Read Back By and Verified With: K. [facility] RN 10/25/24 @0709  BY J. WHITE CRITICAL RESULT CALLED TO, READ BACK BY AND VERIFIED WITH: PHARMD LAURIE POOLE 10/25/24 @1034  PR    Culture (A)  Final    METHICILLIN RESISTANT STAPHYLOCOCCUS AUREUS Sent to [facility] for further susceptibility testing. Performed at [facility], [address], [place], [place] [address]    Report Status PENDING  Incomplete   Organism ID, Bacteria METHICILLIN RESISTANT STAPHYLOCOCCUS AUREUS  Final      Susceptibility   Methicillin resistant staphylococcus aureus - MIC*    CIPROFLOXACIN  >=8 RESISTANT Resistant     ERYTHROMYCIN >=8 RESISTANT Resistant     GENTAMICIN <=0.5 SENSITIVE Sensitive     OXACILLIN >=4 RESISTANT Resistant     TETRACYCLINE >=16 RESISTANT Resistant     VANCOMYCIN  1 SENSITIVE Sensitive     TRIMETH/SULFA <=10 SENSITIVE Sensitive     CLINDAMYCIN >=8 RESISTANT Resistant     RIFAMPIN <=0.5 SENSITIVE Sensitive     Inducible Clindamycin NEGATIVE Sensitive     LINEZOLID 2 SENSITIVE Sensitive     * METHICILLIN RESISTANT STAPHYLOCOCCUS AUREUS  Blood Culture ID Panel (Reflexed)     Status: Abnormal   Collection  Time: 10/24/24  2:51 PM  Result Value Ref Range Status   Enterococcus faecalis NOT DETECTED NOT DETECTED Final   Enterococcus Faecium NOT DETECTED NOT DETECTED Final   Listeria monocytogenes NOT DETECTED NOT DETECTED Final   Staphylococcus species DETECTED (A) NOT DETECTED Final    Comment: CRITICAL RESULT CALLED TO, READ BACK BY AND VERIFIED WITH: PHARMD LAURIE POOLE 10/25/24 @1034  PR    Staphylococcus aureus (BCID) DETECTED (A) NOT DETECTED Final    Comment: Methicillin (oxacillin)-resistant Staphylococcus aureus (MRSA). MRSA is predictably resistant to beta-lactam antibiotics (except ceftaroline). Preferred therapy is vancomycin  unless clinically contraindicated. Patient requires contact precautions if  hospitalized. CRITICAL RESULT CALLED TO, READ BACK BY AND VERIFIED WITH: PHARMD LAURIE POOLE 10/25/24 @1034  PR    Staphylococcus epidermidis NOT DETECTED NOT DETECTED Final   Staphylococcus lugdunensis NOT DETECTED NOT DETECTED Final   Streptococcus species NOT DETECTED NOT DETECTED Final   Streptococcus agalactiae NOT DETECTED NOT DETECTED Final   Streptococcus pneumoniae NOT DETECTED NOT DETECTED Final   Streptococcus pyogenes NOT DETECTED NOT DETECTED Final   A.calcoaceticus-baumannii NOT DETECTED NOT DETECTED Final   Bacteroides fragilis NOT DETECTED NOT DETECTED Final   Enterobacterales NOT DETECTED NOT DETECTED Final   Enterobacter cloacae complex NOT DETECTED NOT DETECTED Final   Escherichia coli NOT DETECTED NOT DETECTED Final   Klebsiella aerogenes NOT DETECTED NOT DETECTED Final   Klebsiella oxytoca NOT DETECTED NOT DETECTED Final   Klebsiella pneumoniae NOT DETECTED NOT DETECTED Final   Proteus species NOT DETECTED NOT DETECTED Final   Salmonella species NOT DETECTED NOT DETECTED Final  Serratia marcescens NOT DETECTED NOT DETECTED Final   Haemophilus influenzae NOT DETECTED NOT DETECTED Final   Neisseria meningitidis NOT DETECTED NOT DETECTED Final   Pseudomonas  aeruginosa NOT DETECTED NOT DETECTED Final   Stenotrophomonas maltophilia NOT DETECTED NOT DETECTED Final   Candida albicans NOT DETECTED NOT DETECTED Final   Candida auris NOT DETECTED NOT DETECTED Final   Candida glabrata NOT DETECTED NOT DETECTED Final   Candida krusei NOT DETECTED NOT DETECTED Final   Candida parapsilosis NOT DETECTED NOT DETECTED Final   Candida tropicalis NOT DETECTED NOT DETECTED Final   Cryptococcus neoformans/gattii NOT DETECTED NOT DETECTED Final   Meth resistant mecA/C and MREJ DETECTED (A) NOT DETECTED Final    Comment: CRITICAL RESULT CALLED TO, READ BACK BY AND VERIFIED WITH: PHARMD LAURIE POOLE 10/25/24 @1034  PR Performed at [facility], [address], [place], [place] [address]   MIC (1 Drug)-     Status: Abnormal (Preliminary result)   Collection Time: 10/24/24  2:51 PM  Result Value Ref Range Status   Min Inhibitory Conc (1 Drug) Preliminary report (A)  Final    Comment: (NOTE) Performed At: [facility] [address] [place], [place] [id number] Jennette Shorter MD Ey:[id number]    Source PENDING  Incomplete  MIC Result     Status: Abnormal   Collection Time: 10/24/24  2:51 PM  Result Value Ref Range Status   Result 1 (MIC) Comment (A)  Final    Comment: (NOTE) Methicillin - resistant Staphylococcus aureus Identification performed by account, not confirmed by this laboratory. DAPTOMYCIN Performed At: [facility] [address] [place], [place] [id number] Jennette Shorter MD Ey:[phone]   MRSA Next Gen by PCR, Nasal     Status: Abnormal   Collection Time: 10/24/24  4:33 PM   Specimen: Nasal Mucosa; Nasal Swab  Result Value Ref Range Status   MRSA by PCR Next Gen DETECTED (A) NOT DETECTED Final    Comment: RESULT CALLED TO, READ BACK BY AND VERIFIED WITH: C KINSLEY,RN@1950  1208/25 MK (NOTE) The GeneXpert MRSA Assay (FDA approved for NASAL specimens only), is one component of a comprehensive MRSA colonization  surveillance program. It is not intended to diagnose MRSA infection nor to guide or monitor treatment for MRSA infections. Test performance is not FDA approved in patients less than 8 years old. Performed at [facility], [address], [place], [place] [address]   Culture, blood (Routine X 2) w Reflex to ID Panel     Status: None (Preliminary result)   Collection Time: 10/26/24  7:10 AM   Specimen: BLOOD  Result Value Ref Range Status   Specimen Description BLOOD RT ALINE  Final   Special Requests   Final    BOTTLES DRAWN AEROBIC ONLY Blood Culture results may not be optimal due to an inadequate volume of blood received in culture bottles   Culture   Final    NO GROWTH 2 DAYS Performed at [facility], [address], [place], [place] [address]    Report Status PENDING  Incomplete  Culture, blood (Routine X 2) w Reflex to ID Panel     Status: None (Preliminary result)   Collection Time: 10/26/24  7:10 AM   Specimen: BLOOD  Result Value Ref Range Status   Specimen Description BLOOD BLOOD RIGHT ARM  Final   Special Requests   Final    BOTTLES DRAWN AEROBIC AND ANAEROBIC Blood Culture adequate volume   Culture   Final    NO GROWTH  2 DAYS Performed at [facility], [address], [place], [place] [address]    Report Status PENDING  Incomplete  Gram stain     Status: None   Collection Time: 10/26/24  1:05 PM   Specimen: Pleura  Result Value Ref Range Status   Specimen Description PLEURAL LEFT  Final   Special Requests PLEURAL LEFT  Final   Gram Stain   Final    PLEURAL CYTOSPIN SMEAR NO ORGANISMS SEEN WBC PRESENT,BOTH PMN AND MONONUCLEAR Performed at [facility], [address] [place], [place] [address]    Report Status 10/26/2024 FINAL  Final  Culture, body fluid w Gram Stain-bottle     Status: None (Preliminary result)   Collection Time: 10/26/24  1:05 PM   Specimen: Pleura  Result Value Ref Range Status   Specimen Description PLEURAL  Final   Special Requests   Final     BOTTLES DRAWN AEROBIC AND ANAEROBIC Blood Culture adequate volume   Culture   Final    NO GROWTH 2 DAYS Performed at [facility], [address], [place], [place] [address]    Report Status PENDING  Incomplete    Studies/Results: ECHOCARDIOGRAM COMPLETE Result Date: 10/28/2024    ECHOCARDIOGRAM REPORT   Patient Name:   [name] COLLIGAN Sr. Date of Exam: 10/27/2024 Medical Rec #:  [id number]         Height:       73.0 in Accession #:    [id number]        Weight:       230.2 lb Date of Birth:  [birth year]        BSA:          2.284 m Patient Age:    63 years          BP:           96/50 mmHg Patient Gender: M                 HR:           62 bpm. Exam Location:  [name] Procedure: 2D Echo (Both Spectral and Color Flow Doppler were utilized during            procedure). Indications:    Bacteremia R78.81  History:        Patient has prior history of Echocardiogram examinations, most                 recent 11/09/2023. Cardiomyopathy and CHF, CAD and Previous                 Myocardial Infarction, Arrythmias:Atrial Flutter and LBBB; Risk                 Factors:Diabetes and Hypertension. ESRD on dialysis [facility]).  Sonographer:    Aida Pizza RCS Referring Phys: [id number] Yoshi Mancillas N VAN DAM  Sonographer Comments: Global longitudinal strain was attempted. IMPRESSIONS  1. Left ventricular ejection fraction, by estimation, is 20 to 25%. The left ventricle has severely decreased function. The left ventricle demonstrates global hypokinesis. There is mild left ventricular hypertrophy. Left ventricular diastolic parameters  are consistent with Grade II diastolic dysfunction (pseudonormalization). Elevated left atrial pressure.  2. Right ventricular systolic function is mildly reduced. The right ventricular size is moderately enlarged. There is moderately elevated pulmonary artery systolic pressure.  3. The mitral valve is abnormal. Moderate mitral valve regurgitation. No evidence of mitral stenosis. Moderate mitral  annular calcification.  4. The tricuspid valve is abnormal.  Tricuspid valve regurgitation is moderate.  5. Severe low flow low gradient aortic stenosis. The aortic valve has an indeterminant number of cusps. There is severe calcifcation of the aortic valve. There is severe thickening of the aortic valve. Aortic valve regurgitation is mild to moderate. Severe aortic valve stenosis. Aortic valve area, by VTI measures 0.77 cm. Aortic valve mean gradient measures 24.0 mmHg. Aortic valve Vmax measures 3.20 m/s.  6. The inferior vena cava is dilated in size with <50% respiratory variability, suggesting right atrial pressure of 15 mmHg. FINDINGS  Left Ventricle: Left ventricular ejection fraction, by estimation, is 20 to 25%. The left ventricle has severely decreased function. The left ventricle demonstrates global hypokinesis. Strain was performed and the global longitudinal strain is indeterminate. The left ventricular internal cavity size was normal in size. There is mild left ventricular hypertrophy. Left ventricular diastolic parameters are consistent with Grade II diastolic dysfunction (pseudonormalization). Elevated left atrial pressure. Right Ventricle: The right ventricular size is moderately enlarged. Right vetricular wall thickness was not well visualized. Right ventricular systolic function is mildly reduced. There is moderately elevated pulmonary artery systolic pressure. The tricuspid regurgitant velocity is 3.11 m/s, and with an assumed right atrial pressure of 15 mmHg, the estimated right ventricular systolic pressure is 53.7 mmHg. Left Atrium: Left atrial size was normal in size. Right Atrium: Right atrial size was normal in size. Pericardium: There is no evidence of pericardial effusion. Mitral Valve: The MR vena contracta is 0.4 cm. The mitral valve is abnormal. There is moderate thickening of the mitral valve leaflet(s). There is moderate calcification of the mitral valve leaflet(s). Moderate mitral  annular calcification. Moderate mitral valve regurgitation. No evidence of mitral valve stenosis. Tricuspid Valve: The tricuspid valve is abnormal. Tricuspid valve regurgitation is moderate . No evidence of tricuspid stenosis. Aortic Valve: Severe low flow low gradient aortic stenosis. The aortic valve has an indeterminant number of cusps. There is severe calcifcation of the aortic valve. There is severe thickening of the aortic valve. There is severe aortic valve annular calcification. Aortic valve regurgitation is mild to moderate. Aortic regurgitation PHT measures 435 msec. Severe aortic stenosis is present. Aortic valve mean gradient measures 24.0 mmHg. Aortic valve peak gradient measures 41.0 mmHg. Aortic valve area,  by VTI measures 0.77 cm. Pulmonic Valve: The pulmonic valve was not well visualized. Pulmonic valve regurgitation is mild. No evidence of pulmonic stenosis. Aorta: The aortic root is normal in size and structure. Venous: The inferior vena cava is dilated in size with less than 50% respiratory variability, suggesting right atrial pressure of 15 mmHg. IAS/Shunts: No atrial level shunt detected by color flow Doppler. Additional Comments: 3D was performed not requiring image post processing on an independent workstation and was abnormal.  LEFT VENTRICLE PLAX 2D LVIDd:         5.75 cm LVIDs:         5.30 cm LV PW:         1.10 cm LV IVS:        1.10 cm LVOT diam:     2.00 cm   3D Volume EF: LV SV:         49        3D EF:        38 % LV SV Index:   21        LV EDV:       217 ml LVOT Area:     3.14 cm  LV ESV:  134 ml                          LV SV:        83 ml RIGHT VENTRICLE TAPSE (M-mode): 1.4 cm LEFT ATRIUM             Index        RIGHT ATRIUM           Index LA diam:        4.90 cm 2.15 cm/m   RA Area:     18.70 cm LA Vol (A2C):   92.0 ml 40.28 ml/m  RA Volume:   56.00 ml  24.52 ml/m LA Vol (A4C):   70.6 ml 30.91 ml/m LA Biplane Vol: 81.3 ml 35.60 ml/m  AORTIC VALVE AV Area (Vmax):     0.69 cm AV Area (Vmean):   0.64 cm AV Area (VTI):     0.77 cm AV Vmax:           320.00 cm/s AV Vmean:          232.000 cm/s AV VTI:            0.635 m AV Peak Grad:      41.0 mmHg AV Mean Grad:      24.0 mmHg LVOT Vmax:         70.30 cm/s LVOT Vmean:        47.600 cm/s LVOT VTI:          0.155 m LVOT/AV VTI ratio: 0.24 AI PHT:            435 msec  AORTA Ao Root diam: 3.70 cm MITRAL VALVE                  TRICUSPID VALVE MV Area (PHT): 4.60 cm       TR Peak grad:   38.7 mmHg MV Decel Time: 165 msec       TR Vmax:        311.00 cm/s MR Peak grad:    108.2 mmHg MR Mean grad:    64.0 mmHg    SHUNTS MR Vmax:         520.00 cm/s  Systemic VTI:  0.16 m MR Vmean:        370.0 cm/s   Systemic Diam: 2.00 cm MR PISA:         1.90 cm MR PISA Eff ROA: 11 mm MR PISA Radius:  0.55 cm MV E velocity: 145.00 cm/s MV A velocity: 70.20 cm/s MV E/A ratio:  2.07 Dorn Ross MD Electronically signed by Dorn Ross MD Signature Date/Time: 10/28/2024/2:06:38 PM    Final       Assessment/Plan:  INTERVAL HISTORY:  HD and arterial line are DC'd   Principal Problem:   Multifocal pneumonia Active Problems:   ESRD on dialysis (HCC)   DM2 (diabetes mellitus, type 2) (HCC)   MRSA bacteremia   History of CVA    Hypotension   Below-knee amputation of right lower extremity (HCC)   CAD (coronary artery disease)   Type 2 diabetes mellitus with diabetic chronic kidney disease (HCC)   Chronic systolic CHF (congestive heart failure) (HCC)   Peripheral vascular disease, unspecified   Severe sepsis with septic shock   Sepsis due to methicillin resistant Staphylococcus aureus (MRSA) (HCC)   Pleural effusion   End stage renal disease (HCC)   Hemodialysis catheter infection    [name] [name] is a  63 y.o. male with  ESRD on HD, DM, now admitted with MRSA bacteremia, multifocal pneumonia and pleural effusion described on CT as loculated, now sp thoracentesis.  #1 MRSA bacteremia  TTE shows  Mitral Valve:  There is moderate thickening of the mitral valve leaflet(s).  There is moderate calcification of the mitral valve leaflet(s).  Moderate  mitral valve regurgitation. No evidence of mitral valve stenosis.   Tricuspid Valve: The tricuspid valve is abnormal. Tricuspid valve  regurgitation is moderate . No evidence of tricuspid stenosis.   Aortic Valve: Severe low flow low gradient aortic stenosis.  There is severe calcifcation  of the aortic valve.  There is severe thickening of the aortic valve. There  is severe aortic valve annular  calcification. Aortic valve regurgitation is mild to moderate. Aortic  regurgitation  Severe aortic stenosis is present.    Pulmonic Valve: The pulmonic valve was not well visualized. Pulmonic valve  regurgitation is mild.   HD and arterial lines are out  Repeat blood cultures ordered by me shortly after this was done  --catheter holiday over weekend --followup blood, pleural fluid cultures though pleural fluid is not an empyema and seems to be more of a transudate --if we do not get a TEE to look for endocarditis then I would simply give this man IV vancomycin  at [facility] and then with HD MWF until he has completed 42 days of therapy with day number one being 10/29/2024 and last dose being January 23rd, 2026  --we can certainly then check surveiilance blood cultures in clinic  #2 Multifocal pneumonia I believe this is all due to MRSA and could be due to septic emboli from the HD catheter or a right sided vegetation  #3 Pleural effusion sp thoracentesis appears more transudative    [name] [name] has an appointment on 12/26/2024 at 1045 AM with Dr. Fleeta Rothman  at  [facility], which  is located in the [facility] at  [address] in [place].  [address], which is located to the left of the elevators.  Phone: [phone]  Fax: [phone]  [email or web address]  The  patient should arrive 30 minutes prior to their appoitment.   I personally spent a total of 50 minutes in the care of the patient today including preparing to see the patient, getting/reviewing separately obtained history, performing a medically appropriate exam/evaluation, counseling and educating, placing orders, referring and communicating with other health care professionals, documenting clinical information in the EHR, communicating results, and coordinating care.  Evaluation of the patient requires complex antimicrobial therapy evaluation, counseling , isolation needs to reduce disease transmission and risk assessment and mitigation.   Dr. Eben is covering this weekend and will follow-up the blood cultures.  Dr Overton will take over the Outside ID Service including Darryle Law and [name] [name] (Virtually)   LOS: 4 days   Jomarie Fleeta Rothman 10/28/2024, 3:41 PM

## 2024-10-28 NOTE — Progress Notes (Signed)
 1540: This RN walked into the patient's room to patient pouring a bottle of pills in his mouth. This RN removed the bottle from the patient's reach. Pills were Midodrine  10 mg tablets. Patient stated that he did not know how many he took. Patient was unable to tell this RN why he took the pills. Patient's brother at bedside stated he brought the pills from [facility] per patient request. Patient alert and oriented x4. BP is 101/46, MAP 55. HR 73.  Dr. Evonnie and [facility] notified and came to bedside. Per the patient he has no other home meds in his room.   1555: EKG completed and given to MD. Per Dr. Evonnie keep Levophed  gtt at 2mcg during the night unless need to increase. Patient consented to allowing security to search his room. Security called. Pill bottle turned into pharmacy and the patient was made aware.

## 2024-10-29 LAB — CBC
HCT: 38 % — ABNORMAL LOW (ref 39.0–52.0)
Hemoglobin: 12.4 g/dL — ABNORMAL LOW (ref 13.0–17.0)
MCH: 24.1 pg — ABNORMAL LOW (ref 26.0–34.0)
MCHC: 32.6 g/dL (ref 30.0–36.0)
MCV: 73.8 fL — ABNORMAL LOW (ref 80.0–100.0)
Platelets: 219 K/uL (ref 150–400)
RBC: 5.15 MIL/uL (ref 4.22–5.81)
RDW: 26.1 % — ABNORMAL HIGH (ref 11.5–15.5)
WBC: 10.2 K/uL (ref 4.0–10.5)
nRBC: 0 % (ref 0.0–0.2)

## 2024-10-29 LAB — COMPREHENSIVE METABOLIC PANEL WITH GFR
ALT: 11 U/L (ref 0–44)
AST: 31 U/L (ref 15–41)
Albumin: 2.8 g/dL — ABNORMAL LOW (ref 3.5–5.0)
Alkaline Phosphatase: 177 U/L — ABNORMAL HIGH (ref 38–126)
Anion gap: 13 (ref 5–15)
BUN: 38 mg/dL — ABNORMAL HIGH (ref 8–23)
CO2: 25 mmol/L (ref 22–32)
Calcium: 8.3 mg/dL — ABNORMAL LOW (ref 8.9–10.3)
Chloride: 91 mmol/L — ABNORMAL LOW (ref 98–111)
Creatinine, Ser: 7.71 mg/dL — ABNORMAL HIGH (ref 0.61–1.24)
GFR, Estimated: 7 mL/min — ABNORMAL LOW (ref >60)
Glucose, Bld: 270 mg/dL — ABNORMAL HIGH (ref 70–99)
Potassium: 3.9 mmol/L (ref 3.5–5.1)
Sodium: 130 mmol/L — ABNORMAL LOW (ref 135–145)
Total Bilirubin: 1.4 mg/dL — ABNORMAL HIGH (ref 0.0–1.2)
Total Protein: 7.4 g/dL (ref 6.5–8.1)

## 2024-10-29 LAB — GLUCOSE, CAPILLARY
Glucose-Capillary: 259 mg/dL — ABNORMAL HIGH (ref 70–99)
Glucose-Capillary: 329 mg/dL — ABNORMAL HIGH (ref 70–99)
Glucose-Capillary: 291 mg/dL — ABNORMAL HIGH (ref 70–99)
Glucose-Capillary: 188 mg/dL — ABNORMAL HIGH (ref 70–99)
Glucose-Capillary: 233 mg/dL — ABNORMAL HIGH (ref 70–99)

## 2024-10-29 LAB — MAGNESIUM: Magnesium: 2.4 mg/dL (ref 1.7–2.4)

## 2024-10-29 NOTE — Progress Notes (Signed)
° °      Overnight   NAME: [name] [name] Sr. MRN: [id number] DOB : 1961/09/16    Date of Service   10/29/2024   HPI/Events of Note   HPI: 63 year old male with medical history significant for ESRD on dialysis atrial fibs systolic heart failure hypertension diabetes left bundle branch block.  Patient presented to the ED with complaints of difficulty breathing yesterday about 2 to 3 weeks ago he has a bad cough that has been going on for about 15 years but is worsened from baseline.  Recently seen in the emergency department on 12/2 for dysuria and started on ciprofloxacin  for which she has been compliant.  CT chest positive for multifactoral bronchopneumonia tiny right moderate left pleural effusion with collapse/consolidation in left lower lobe may be due to atelectasis IV ceftriaxone  and azithromycin  started.  Infectious disease consulted.  Who recommended catheter holiday & pleural fluid cultures.   Of note patient took 13 tablets of midodrine  from home medication on 12/12 at roughly 4 PM.  Poison control was contacted and was recommended to perform an EKG at the time and then repeat in 6 hours per  Dr. Collie note.   Overnight : RN reported a 25 beat run of V. tach.  Reports vital signs stable.  Mentation the same as before. denies chest pain and SOB.   Upon chart review realized repeat EKG had not been performed.   Interventions/ Plan    EKG ordered - NSR with no ST elevation/changes    CBC, mag, Phos ordered to check for electrolyte abnormalities.       Dustan Hyams Donati- Aram BSN RN CCRN AGACNP-BC Acute Care Nurse Practitioner [facility]

## 2024-10-29 NOTE — Progress Notes (Signed)
 PROGRESS NOTE  [name] [name] Sr. FMW:[id number] DOB: 07/23/1961 DOA: 10/24/2024 PCP: [name], MD  Brief History:  63 y.o. male with medical history significant for ESRD on dialysis, atrial fibrillation, systolic CHF, hypertension, diabetes, LBBB.  Patient presented to the ED with complaints of difficulty breathing yesterday about 2 to 3 weeks ago.  He has a bad cough that has been going on for about 15 years, but has worsened from baseline.  Reported some dizziness over the past few days, present to respectable position, and intermittent.  He denies dizziness at this time.  No chest pain, no lower extremity swelling.  Reports he never smoked cigarettes.  His last dialysis was on Friday- 2/5.  He did not get dialyzed today, and has not taken his medications today which include midodrine .  He was in the ED 12/2 with reports of dysuria.  He makes very little urine.  He was started on a course of ciprofloxacin  which he started 12/3, and has been compliant.  He was also started on doxycycline  11/30 for chest infiltrate-thought more likely to be fluid related.   ED Course: Tmax 98.6.  Heart rate 66-91.  Respirate rate 16.  Blood pressure systolic initially 130s now down to 60s on my evaluation.  O2 sats greater 93% on room air.  Lactic acid 2.1.  WBC 12.9.  Sodium 130.  COVID influenza RSV negative.  Troponin 225 >> 216.  CT chest without contrast-multifocal bronchopneumonia, tiny right and moderate partially loculated left pleural effusion.  Collapse/consolidation in left lower lobe may be due to atelectasis, difficult to exclude pneumonia.  Nephrology consulted, no emergent need for dialysis.  IV ceftriaxone  and azithromycin  started.  500ml N/s bolus given.   Assessment/Plan:  Severe sepsis with septic shock  - secondary to multifocal pneumonia and MRSA bacteremia - blood cultures positive for MRSA - continue IV vancomycin  - ID consultation noted - remains on IV norepinephrine  infusion  at 3 mcg/kg/min - wean off norepi   MRSA bacteremia  - 10/24/24 blood culture = MRSA - 10/26/24--blood culture = neg - 10/28/24 blood culture negative - HD cath removed on 10/28/24 - arterial line removed 10/28/24 - 12/11 TTE--EF 20-25%, G2DD, mild decrease RVF; severe AS, no vegetation - Continue line holiday on sat/sun   Left pleural effusion--transudate -10/26/2024--left thoracocentesis>> sent for cell count and cultures -does not appear to be empyema   Chronic hypotension  - he is normally on midodrine  TID - he now presents with sepsis physiology - he is on IV norepinephrine  now at 4 mcg/kg/min>>2 - wean off pressor as able  - midodrine  dose increased to 20 mg   ESRD on HD MWF - pt had inpatient HD 12/12 - consultation from nephrologist   Chronic HFrEF - volume management mostly with HD -- 12/11 TTE--EF 20-25%, G2DD, mild decrease RVF; severe AS, no vegetation - makes small amount of urine - compensated   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   Objective: Vitals:   10/29/24 0700 10/29/24 0746 10/29/24 1127 10/29/24 1156  BP: 112/87   (!) 119/96  Pulse: 94   89  Resp: 19     Temp:  98.5 F (36.9 C) 99 F (37.2 C)   TempSrc:  Oral Oral   SpO2: 95%   95%  Weight:      Height:        Intake/Output Summary (Last 24 hours) at 10/29/2024 1337 Last data filed at 10/29/2024 1300 Gross  per 24 hour  Intake 935.35 ml  Output --  Net 935.35 ml   Weight change:  Exam:  General:  Pt is alert, follows commands appropriately, not in acute distress HEENT: No icterus, No thrush, No neck mass, Cut Off/AT Cardiovascular: RRR, S1/S2, no rubs, no gallops Respiratory: CTA bilaterally, no wheezing, no crackles, no rhonchi Abdomen: Soft/+BS, non tender, non distended, no guarding Extremities: No edema, No lymphangitis, No petechiae, No rashes, no synovitis right BKA site without erythema or drainage.   Data Reviewed: I have personally reviewed following labs and imaging studies Basic Metabolic Panel: Recent Labs  Lab 10/24/24 1112 10/25/24 0442 10/28/24 0749 10/29/24 0343  NA 130* 130* 130* 130*  K 3.7 4.1 3.9 3.9  CL 87* 88* 90* 91*  CO2 22 19* 25 25  GLUCOSE 105* 215* 234* 270*  BUN 51* 63* 59* 38*  CREATININE 10.50* 11.20* 10.90* 7.71*  CALCIUM  9.6 9.2 7.8* 8.3*  MG  --   --   --  2.4  PHOS  --  8.0* 5.5*  --    Liver Function Tests: Recent Labs  Lab 10/24/24 1457 10/25/24 0442 10/28/24 0749 10/29/24 0343  AST 23  --   --  31  ALT 10  --   --  11  ALKPHOS 118  --   --  177*  BILITOT 2.4*  --   --  1.4*  PROT 7.8  --   --  7.4  ALBUMIN  3.0* 3.0* 2.8* 2.8*   No results for input(s): LIPASE, AMYLASE in the last 168 hours. No results for input(s): AMMONIA in the last 168 hours. Coagulation Profile: No results for input(s): INR, PROTIME in the last 168 hours. CBC: Recent Labs  Lab 10/24/24 1014 10/25/24 0442 10/28/24 0749 10/29/24 0343  WBC 10.9* 16.1* 9.1 10.2  NEUTROABS 8.0*  --   --   --   HGB 14.0 12.6* 12.0* 12.4*  HCT 43.1 37.8* 36.2* 38.0*  MCV 75.3* 74.6* 73.9* 73.8*  PLT 146* 185 208 219   Cardiac Enzymes: No results for input(s): CKTOTAL, CKMB, CKMBINDEX, TROPONINI in the last 168 hours. BNP: Invalid input(s): POCBNP CBG: Recent Labs  Lab 10/28/24 1627 10/28/24 2108 10/29/24 0308 10/29/24 0745 10/29/24 1125   GLUCAP 275* 224* 259* 329* 291*   HbA1C: No results for input(s): HGBA1C in the last 72 hours. Urine analysis:    Component Value Date/Time   COLORURINE AMBER (A) 07/01/2022 1143   APPEARANCEUR CLEAR 07/01/2022 1143   LABSPEC 1.017 07/01/2022 1143   PHURINE 7.0 07/01/2022 1143   GLUCOSEU NEGATIVE 07/01/2022 1143   HGBUR NEGATIVE 07/01/2022 1143   BILIRUBINUR NEGATIVE 07/01/2022 1143   KETONESUR NEGATIVE 07/01/2022 1143   PROTEINUR >=300 (A) 07/01/2022 1143   NITRITE NEGATIVE 07/01/2022 1143   LEUKOCYTESUR NEGATIVE 07/01/2022 1143   Sepsis Labs: @LABRCNTIP (procalcitonin:4,lacticidven:4) ) Recent Results (from the past 240 hours)  Resp panel by RT-PCR (RSV, Flu A&B, Covid) Anterior Nasal Swab     Status: None   Collection Time: 10/24/24 10:20 AM   Specimen: Anterior Nasal Swab  Result Value Ref Range Status   SARS Coronavirus 2 by RT PCR NEGATIVE NEGATIVE Final    Comment: (NOTE) SARS-CoV-2 target nucleic acids are NOT DETECTED.  The SARS-CoV-2 RNA is generally detectable in upper respiratory specimens during the acute phase of infection. The lowest concentration of SARS-CoV-2 viral copies this assay can detect is 138 copies/mL. A negative result does not preclude SARS-Cov-2 infection and should not be used as the sole basis for treatment or other patient management decisions. A negative result may occur with  improper specimen collection/handling, submission of specimen other than nasopharyngeal swab, presence of viral mutation(s) within the areas targeted by this assay, and inadequate number of viral copies(<138 copies/mL). A negative result must be combined with clinical observations, patient history, and epidemiological information. The expected result is Negative.  Fact Sheet for Patients:  bloggercourse.com  Fact Sheet for Healthcare Providers:  seriousbroker.it  This test is no t yet approved or cleared by the  [place]  FDA and  has been authorized for detection and/or diagnosis of SARS-CoV-2 by FDA under an Emergency Use Authorization (EUA). This EUA will remain  in effect (meaning this test can be used) for the duration of the COVID-19 declaration under Section 564(b)(1) of the Act, 21 U.S.C.section 360bbb-3(b)(1), unless the authorization is terminated  or revoked sooner.       Influenza A by PCR NEGATIVE NEGATIVE Final   Influenza B by PCR NEGATIVE NEGATIVE Final    Comment: (NOTE) The Xpert Xpress SARS-CoV-2/FLU/RSV plus assay is intended as an aid in the diagnosis of influenza from Nasopharyngeal swab specimens and should not be used as a sole basis for treatment. Nasal washings and aspirates are unacceptable for Xpert Xpress SARS-CoV-2/FLU/RSV testing.  Fact Sheet for Patients: bloggercourse.com  Fact Sheet for Healthcare Providers: seriousbroker.it  This test is not yet approved or cleared by the [place]  FDA and has been authorized for detection and/or diagnosis of SARS-CoV-2 by FDA under an Emergency Use Authorization (EUA). This EUA will remain in effect (meaning this test can be used) for the duration of the COVID-19 declaration under Section 564(b)(1) of the Act, 21 U.S.C. section 360bbb-3(b)(1), unless the authorization is terminated or revoked.     Resp Syncytial Virus by PCR NEGATIVE NEGATIVE Final    Comment: (NOTE) Fact Sheet for Patients: bloggercourse.com  Fact Sheet for Healthcare Providers: seriousbroker.it  This test is not yet approved or cleared by the [place]  FDA and has been authorized for detection and/or diagnosis of SARS-CoV-2 by FDA under an Emergency Use Authorization (EUA). This EUA will remain in effect (meaning this test can be used) for the duration of the COVID-19 declaration under Section 564(b)(1) of the Act, 21 U.S.C. section  360bbb-3(b)(1), unless the authorization is terminated or revoked.  Performed at [facility], [address], [place], [place] [address]   Culture, blood (routine x 2)     Status: Abnormal   Collection Time: 10/24/24  2:07 PM   Specimen: BLOOD RIGHT FOREARM  Result Value Ref Range Status   Specimen Description   Final    BLOOD RIGHT FOREARM BOTTLES DRAWN AEROBIC AND ANAEROBIC Performed at [facility], [address] [place], [place] [address]    Special Requests   Final    Blood Culture results may not be optimal due to an inadequate  volume of blood received in culture bottles Performed at [facility], [address], [place], [place] [address]    Culture  Setup Time   Final    IN BOTH AEROBIC AND ANAEROBIC BOTTLES GRAM POSITIVE COCCI IN CLUSTERS Gram Stain Report Called to,Read Back By and Verified With: K. [facility] RN 10/25/24 @0709  BY J. WHITE Performed at [facility], [address], [place], [place] [address]    Culture (A)  Final    STAPHYLOCOCCUS AUREUS SUSCEPTIBILITIES PERFORMED ON PREVIOUS CULTURE WITHIN THE LAST 5 DAYS. Performed at [facility], [address] [place], [place] [address]    Report Status 10/27/2024 FINAL  Final  Culture, blood (routine x 2)     Status: Abnormal (Preliminary result)   Collection Time: 10/24/24  2:51 PM   Specimen: BLOOD  Result Value Ref Range Status   Specimen Description   Final    BLOOD BLOOD RIGHT ARM Performed at [facility], [address], [place], [place] [address]    Special Requests   Final    BOTTLES DRAWN AEROBIC AND ANAEROBIC Blood Culture adequate volume Performed at [facility], [address] [place], [place] [address]    Culture  Setup Time   Final    IN BOTH AEROBIC AND ANAEROBIC BOTTLES GRAM POSITIVE COCCI IN CLUSTERS Gram Stain Report Called to,Read Back By and Verified With: K. [facility] RN 10/25/24 @0709  BY J. WHITE CRITICAL RESULT CALLED TO, READ BACK BY AND VERIFIED WITH: PHARMD LAURIE POOLE 10/25/24  @1034  PR    Culture (A)  Final    METHICILLIN RESISTANT STAPHYLOCOCCUS AUREUS Sent to [facility] for further susceptibility testing. Performed at [facility], [address], [place], [place] [address]    Report Status PENDING  Incomplete   Organism ID, Bacteria METHICILLIN RESISTANT STAPHYLOCOCCUS AUREUS  Final      Susceptibility   Methicillin resistant staphylococcus aureus - MIC*    CIPROFLOXACIN  >=8 RESISTANT Resistant     ERYTHROMYCIN >=8 RESISTANT Resistant     GENTAMICIN <=0.5 SENSITIVE Sensitive     OXACILLIN >=4 RESISTANT Resistant     TETRACYCLINE >=16 RESISTANT Resistant     VANCOMYCIN  1 SENSITIVE Sensitive     TRIMETH/SULFA <=10 SENSITIVE Sensitive     CLINDAMYCIN >=8 RESISTANT Resistant     RIFAMPIN <=0.5 SENSITIVE Sensitive     Inducible Clindamycin NEGATIVE Sensitive     LINEZOLID 2 SENSITIVE Sensitive     * METHICILLIN RESISTANT STAPHYLOCOCCUS AUREUS  Blood Culture ID Panel (Reflexed)     Status: Abnormal   Collection Time: 10/24/24  2:51 PM  Result Value Ref Range Status   Enterococcus faecalis NOT DETECTED NOT DETECTED Final   Enterococcus Faecium NOT DETECTED NOT DETECTED Final   Listeria monocytogenes NOT DETECTED NOT DETECTED Final   Staphylococcus species DETECTED (A) NOT DETECTED Final    Comment: CRITICAL RESULT CALLED TO, READ BACK BY AND VERIFIED WITH: PHARMD LAURIE POOLE 10/25/24 @1034  PR    Staphylococcus aureus (BCID) DETECTED (A) NOT DETECTED Final    Comment: Methicillin (oxacillin)-resistant Staphylococcus aureus (MRSA). MRSA is predictably resistant to beta-lactam antibiotics (except ceftaroline). Preferred therapy is vancomycin  unless clinically contraindicated. Patient requires contact precautions if  hospitalized. CRITICAL RESULT CALLED TO, READ BACK BY AND VERIFIED WITH: PHARMD LAURIE POOLE 10/25/24 @1034  PR    Staphylococcus epidermidis NOT DETECTED NOT DETECTED Final   Staphylococcus lugdunensis NOT DETECTED NOT DETECTED Final    Streptococcus species NOT DETECTED NOT DETECTED Final   Streptococcus agalactiae NOT  DETECTED NOT DETECTED Final   Streptococcus pneumoniae NOT DETECTED NOT DETECTED Final   Streptococcus pyogenes NOT DETECTED NOT DETECTED Final   A.calcoaceticus-baumannii NOT DETECTED NOT DETECTED Final   Bacteroides fragilis NOT DETECTED NOT DETECTED Final   Enterobacterales NOT DETECTED NOT DETECTED Final   Enterobacter cloacae complex NOT DETECTED NOT DETECTED Final   Escherichia coli NOT DETECTED NOT DETECTED Final   Klebsiella aerogenes NOT DETECTED NOT DETECTED Final   Klebsiella oxytoca NOT DETECTED NOT DETECTED Final   Klebsiella pneumoniae NOT DETECTED NOT DETECTED Final   Proteus species NOT DETECTED NOT DETECTED Final   Salmonella species NOT DETECTED NOT DETECTED Final   Serratia marcescens NOT DETECTED NOT DETECTED Final   Haemophilus influenzae NOT DETECTED NOT DETECTED Final   Neisseria meningitidis NOT DETECTED NOT DETECTED Final   Pseudomonas aeruginosa NOT DETECTED NOT DETECTED Final   Stenotrophomonas maltophilia NOT DETECTED NOT DETECTED Final   Candida albicans NOT DETECTED NOT DETECTED Final   Candida auris NOT DETECTED NOT DETECTED Final   Candida glabrata NOT DETECTED NOT DETECTED Final   Candida krusei NOT DETECTED NOT DETECTED Final   Candida parapsilosis NOT DETECTED NOT DETECTED Final   Candida tropicalis NOT DETECTED NOT DETECTED Final   Cryptococcus neoformans/gattii NOT DETECTED NOT DETECTED Final   Meth resistant mecA/C and MREJ DETECTED (A) NOT DETECTED Final    Comment: CRITICAL RESULT CALLED TO, READ BACK BY AND VERIFIED WITH: PHARMD LAURIE POOLE 10/25/24 @1034  PR Performed at [facility], [address] [place], [place] [address]   MIC (1 Drug)-     Status: Abnormal (Preliminary result)   Collection Time: 10/24/24  2:51 PM  Result Value Ref Range Status   Min Inhibitory Conc (1 Drug) Preliminary report (A)  Final    Comment: (NOTE) Performed At: [facility] [address] [place], [place] [id number] Jennette Shorter MD Ey:[id number]    Source PENDING  Incomplete  MIC Result     Status: Abnormal   Collection Time: 10/24/24  2:51 PM  Result Value Ref Range Status   Result 1 (MIC) Comment (A)  Final    Comment: (NOTE) Methicillin - resistant Staphylococcus aureus Identification performed by account, not confirmed by this laboratory. DAPTOMYCIN Performed At: [facility] [address] [place], [place] [id number] Jennette Shorter MD Ey:[id number]   MRSA Next Gen by PCR, Nasal     Status: Abnormal   Collection Time: 10/24/24  4:33 PM   Specimen: Nasal Mucosa; Nasal Swab  Result Value Ref Range Status   MRSA by PCR Next Gen DETECTED (A) NOT DETECTED Final    Comment: RESULT CALLED TO, READ BACK BY AND VERIFIED WITH: C KINSLEY,RN@1950  1208/25 MK (NOTE) The GeneXpert MRSA Assay (FDA approved for NASAL specimens only), is one component of a comprehensive MRSA colonization surveillance program. It is not intended to diagnose MRSA infection nor to guide or monitor treatment for MRSA infections. Test performance is not FDA approved in patients less than 18 years old. Performed at [facility], [address] [place], [place] [address]   Culture, blood (Routine X 2) w Reflex to ID Panel     Status: None (Preliminary result)   Collection Time: 10/26/24  7:10 AM   Specimen: BLOOD  Result Value Ref Range Status   Specimen Description BLOOD RT ALINE  Final   Special Requests   Final    BOTTLES DRAWN AEROBIC ONLY Blood Culture results may not be optimal due to an inadequate volume of blood received  in culture bottles   Culture   Final    NO GROWTH 3 DAYS Performed at [facility], [address], [place], [place] [address]    Report Status PENDING  Incomplete  Culture, blood (Routine X 2) w Reflex to ID Panel     Status: None (Preliminary result)   Collection Time: 10/26/24  7:10 AM   Specimen: BLOOD  Result Value Ref  Range Status   Specimen Description BLOOD BLOOD RIGHT ARM  Final   Special Requests   Final    BOTTLES DRAWN AEROBIC AND ANAEROBIC Blood Culture adequate volume   Culture   Final    NO GROWTH 3 DAYS Performed at [facility], [address] [place], [place] [address]    Report Status PENDING  Incomplete  Gram stain     Status: None   Collection Time: 10/26/24  1:05 PM   Specimen: Pleura  Result Value Ref Range Status   Specimen Description PLEURAL LEFT  Final   Special Requests PLEURAL LEFT  Final   Gram Stain   Final    PLEURAL CYTOSPIN SMEAR NO ORGANISMS SEEN WBC PRESENT,BOTH PMN AND MONONUCLEAR Performed at [facility], [address] [place], [place] [address]    Report Status 10/26/2024 FINAL  Final  Culture, body fluid w Gram Stain-bottle     Status: None (Preliminary result)   Collection Time: 10/26/24  1:05 PM   Specimen: Pleura  Result Value Ref Range Status   Specimen Description PLEURAL  Final   Special Requests   Final    BOTTLES DRAWN AEROBIC AND ANAEROBIC Blood Culture adequate volume   Culture   Final    NO GROWTH 3 DAYS Performed at [facility], [address], [place], [place] [address]    Report Status PENDING  Incomplete  Culture, blood (Routine X 2) w Reflex to ID Panel     Status: None (Preliminary result)   Collection Time: 10/28/24  3:23 PM   Specimen: BLOOD  Result Value Ref Range Status   Specimen Description BLOOD BLOOD RIGHT ARM  Final   Special Requests   Final    BOTTLES DRAWN AEROBIC AND ANAEROBIC Blood Culture adequate volume   Culture   Final    NO GROWTH < 24 HOURS Performed at [facility], [address], [place], [place] [address]    Report Status PENDING  Incomplete  Culture, blood (Routine X 2) w Reflex to ID Panel     Status: None (Preliminary result)   Collection Time: 10/28/24  3:23 PM   Specimen: BLOOD  Result Value Ref Range Status   Specimen Description BLOOD RIGHT ANTECUBITAL  Final   Special Requests   Final    BOTTLES  DRAWN AEROBIC AND ANAEROBIC Blood Culture adequate volume   Culture   Final    NO GROWTH < 24 HOURS Performed at [facility], [address], [place], [place] [address]    Report Status PENDING  Incomplete     Scheduled Meds:  aspirin  EC  81 mg Oral Q breakfast   Chlorhexidine  Gluconate Cloth  6 each Topical Q0600   cinacalcet   60 mg Oral Q breakfast   gabapentin   300 mg Oral Daily   heparin   5,000 Units Subcutaneous Q8H   insulin  aspart  0-5 Units Subcutaneous QHS   insulin  aspart  0-9 Units Subcutaneous TID WC   sevelamer  carbonate  4,000 mg Oral TID WC   vancomycin  variable dose per unstable renal function (pharmacist dosing)   Does  not apply See admin instructions   Continuous Infusions:  anticoagulant sodium citrate      norepinephrine  (LEVOPHED ) Adult infusion Stopped (10/29/24 1233)   vancomycin  Stopped (10/28/24 1459)    Procedures/Studies: ECHOCARDIOGRAM COMPLETE Result Date: 10/28/2024    ECHOCARDIOGRAM REPORT   Patient Name:   [name] [name] Sr. Date of Exam: 10/27/2024 Medical Rec #:  [id number]         Height:       73.0 in Accession #:    [id number]        Weight:       230.2 lb Date of Birth:  11-Nov-1961        BSA:          2.284 m Patient Age:    63 years          BP:           96/50 mmHg Patient Gender: M                 HR:           62 bpm. Exam Location:  [facility] Procedure: 2D Echo (Both Spectral and Color Flow Doppler were utilized during            procedure). Indications:    Bacteremia R78.81  History:        Patient has prior history of Echocardiogram examinations, most                 recent 11/09/2023. Cardiomyopathy and CHF, CAD and Previous                 Myocardial Infarction, Arrythmias:Atrial Flutter and LBBB; Risk                 Factors:Diabetes and Hypertension. ESRD on dialysis [facility]).  Sonographer:    Aida Pizza RCS Referring Phys: [id number] CORNELIUS N VAN DAM  Sonographer Comments: Global longitudinal strain was attempted. IMPRESSIONS  1. Left  ventricular ejection fraction, by estimation, is 20 to 25%. The left ventricle has severely decreased function. The left ventricle demonstrates global hypokinesis. There is mild left ventricular hypertrophy. Left ventricular diastolic parameters  are consistent with Grade II diastolic dysfunction (pseudonormalization). Elevated left atrial pressure.  2. Right ventricular systolic function is mildly reduced. The right ventricular size is moderately enlarged. There is moderately elevated pulmonary artery systolic pressure.  3. The mitral valve is abnormal. Moderate mitral valve regurgitation. No evidence of mitral stenosis. Moderate mitral annular calcification.  4. The tricuspid valve is abnormal. Tricuspid valve regurgitation is moderate.  5. Severe low flow low gradient aortic stenosis. The aortic valve has an indeterminant number of cusps. There is severe calcifcation of the aortic valve. There is severe thickening of the aortic valve. Aortic valve regurgitation is mild to moderate. Severe aortic valve stenosis. Aortic valve area, by VTI measures 0.77 cm. Aortic valve mean gradient measures 24.0 mmHg. Aortic valve Vmax measures 3.20 m/s.  6. The inferior vena cava is dilated in size with <50% respiratory variability, suggesting right atrial pressure of 15 mmHg. FINDINGS  Left Ventricle: Left ventricular ejection fraction, by estimation, is 20 to 25%. The left ventricle has severely decreased function. The left ventricle demonstrates global hypokinesis. Strain was performed and the global longitudinal strain is indeterminate. The left ventricular internal cavity size was normal in size. There is mild left ventricular hypertrophy. Left ventricular diastolic parameters are consistent with Grade II diastolic dysfunction (pseudonormalization). Elevated left atrial pressure. Right Ventricle: The right ventricular size  is moderately enlarged. Right vetricular wall thickness was not well visualized. Right ventricular  systolic function is mildly reduced. There is moderately elevated pulmonary artery systolic pressure. The tricuspid regurgitant velocity is 3.11 m/s, and with an assumed right atrial pressure of 15 mmHg, the estimated right ventricular systolic pressure is 53.7 mmHg. Left Atrium: Left atrial size was normal in size. Right Atrium: Right atrial size was normal in size. Pericardium: There is no evidence of pericardial effusion. Mitral Valve: The MR vena contracta is 0.4 cm. The mitral valve is abnormal. There is moderate thickening of the mitral valve leaflet(s). There is moderate calcification of the mitral valve leaflet(s). Moderate mitral annular calcification. Moderate mitral valve regurgitation. No evidence of mitral valve stenosis. Tricuspid Valve: The tricuspid valve is abnormal. Tricuspid valve regurgitation is moderate . No evidence of tricuspid stenosis. Aortic Valve: Severe low flow low gradient aortic stenosis. The aortic valve has an indeterminant number of cusps. There is severe calcifcation of the aortic valve. There is severe thickening of the aortic valve. There is severe aortic valve annular calcification. Aortic valve regurgitation is mild to moderate. Aortic regurgitation PHT measures 435 msec. Severe aortic stenosis is present. Aortic valve mean gradient measures 24.0 mmHg. Aortic valve peak gradient measures 41.0 mmHg. Aortic valve area,  by VTI measures 0.77 cm. Pulmonic Valve: The pulmonic valve was not well visualized. Pulmonic valve regurgitation is mild. No evidence of pulmonic stenosis. Aorta: The aortic root is normal in size and structure. Venous: The inferior vena cava is dilated in size with less than 50% respiratory variability, suggesting right atrial pressure of 15 mmHg. IAS/Shunts: No atrial level shunt detected by color flow Doppler. Additional Comments: 3D was performed not requiring image post processing on an independent workstation and was abnormal.  LEFT VENTRICLE PLAX 2D  LVIDd:         5.75 cm LVIDs:         5.30 cm LV PW:         1.10 cm LV IVS:        1.10 cm LVOT diam:     2.00 cm   3D Volume EF: LV SV:         49        3D EF:        38 % LV SV Index:   21        LV EDV:       217 ml LVOT Area:     3.14 cm  LV ESV:       134 ml                          LV SV:        83 ml RIGHT VENTRICLE TAPSE (M-mode): 1.4 cm LEFT ATRIUM             Index        RIGHT ATRIUM           Index LA diam:        4.90 cm 2.15 cm/m   RA Area:     18.70 cm LA Vol (A2C):   92.0 ml 40.28 ml/m  RA Volume:   56.00 ml  24.52 ml/m LA Vol (A4C):   70.6 ml 30.91 ml/m LA Biplane Vol: 81.3 ml 35.60 ml/m  AORTIC VALVE AV Area (Vmax):    0.69 cm AV Area (Vmean):   0.64 cm AV Area (VTI):     0.77 cm  AV Vmax:           320.00 cm/s AV Vmean:          232.000 cm/s AV VTI:            0.635 m AV Peak Grad:      41.0 mmHg AV Mean Grad:      24.0 mmHg LVOT Vmax:         70.30 cm/s LVOT Vmean:        47.600 cm/s LVOT VTI:          0.155 m LVOT/AV VTI ratio: 0.24 AI PHT:            435 msec  AORTA Ao Root diam: 3.70 cm MITRAL VALVE                  TRICUSPID VALVE MV Area (PHT): 4.60 cm       TR Peak grad:   38.7 mmHg MV Decel Time: 165 msec       TR Vmax:        311.00 cm/s MR Peak grad:    108.2 mmHg MR Mean grad:    64.0 mmHg    SHUNTS MR Vmax:         520.00 cm/s  Systemic VTI:  0.16 m MR Vmean:        370.0 cm/s   Systemic Diam: 2.00 cm MR PISA:         1.90 cm MR PISA Eff ROA: 11 mm MR PISA Radius:  0.55 cm MV E velocity: 145.00 cm/s MV A velocity: 70.20 cm/s MV E/A ratio:  2.07 Dorn Ross MD Electronically signed by Dorn Ross MD Signature Date/Time: 10/28/2024/2:06:38 PM    Final    US  THORACENTESIS ASP PLEURAL SPACE W/IMG GUIDE Result Date: 10/26/2024 INDICATION: Patient admitted 10/24/2024 with severe sepsis, MRSA bacteremia and new left pleural effusion. Request for diagnostic and therapeutic left thoracentesis. EXAM: ULTRASOUND GUIDED LEFT THORACENTESIS MEDICATIONS: 6 mL 1% lidocaine   COMPLICATIONS: None immediate. PROCEDURE: An ultrasound guided thoracentesis was thoroughly discussed with the patient and questions answered. The benefits, risks, alternatives and complications were also discussed. The patient understands and wishes to proceed with the procedure. Written consent was obtained. Ultrasound was performed to localize and mark an adequate pocket of fluid in the left chest. The area was then prepped and draped in the normal sterile fashion. 1% Lidocaine  was used for local anesthesia. Under ultrasound guidance a 19 gauge, 7-cm, Yueh catheter was introduced. Thoracentesis was performed. The catheter was removed and a dressing applied. FINDINGS: A total of approximately 450 mL of clear yellow fluid was removed. Samples were sent to the laboratory as requested by the clinical team. IMPRESSION: Successful ultrasound guided left thoracentesis yielding 450 mL of pleural fluid. Performed by Clotilda Hesselbach, PA-C Electronically Signed   By: Ester Sides M.D.   On: 10/26/2024 16:13   DG Chest 1 View Result Date: 10/26/2024 EXAM: 1 VIEW(S) XRAY OF THE CHEST 10/26/2024 01:55:07 PM COMPARISON: 2 days ago. CLINICAL HISTORY: [id number] Status post thoracentesis [id number] Status post thoracentesis. FINDINGS: LINES, TUBES AND DEVICES: Stable left internal jugular dialysis catheter. LUNGS AND PLEURA: New right upper lobe opacity is noted concerning for possible infiltrate. Stable left basilar atelectasis and possible small pleural effusion is noted. No pneumothorax is noted. HEART AND MEDIASTINUM: Stable cardiomegaly. BONES AND SOFT TISSUES: No acute osseous abnormality. IMPRESSION: 1. New right upper lobe opacity, concerning for possible pneumonia. 2. Stable left basilar atelectasis with possible small pleural  effusion. Electronically signed by: [name] Seip MD 10/26/2024 02:38 PM EST RP Workstation: [id number]   CT Chest Wo Contrast Result Date: 10/24/2024 CLINICAL DATA:  Pneumonia. Shortness of breath  after dialysis Friday. EXAM: CT CHEST WITHOUT CONTRAST TECHNIQUE: Multidetector CT imaging of the chest was performed following the standard protocol without IV contrast. RADIATION DOSE REDUCTION: This exam was performed according to the departmental dose-optimization program which includes automated exposure control, adjustment of the mA and/or kV according to patient size and/or use of iterative reconstruction technique. COMPARISON:  11/08/2023. FINDINGS: Cardiovascular: Atherosclerotic calcification of the aorta, aortic valve and coronary arteries. Enlarged pulmonic trunk and heart. No pericardial effusion. Left IJ dialysis catheter terminates in the right atrium. Mediastinum/Nodes: No pathologically enlarged mediastinal or axillary lymph nodes. Hilar regions are difficult to definitively evaluate without IV contrast. Esophagus is grossly unremarkable. Lungs/Pleura: Image quality is degraded by expiratory phase imaging and respiratory motion. Peribronchovascular ground-glass and consolidation in the anterior segment right upper lobe and likely elsewhere within the right middle and right lower lobes. Tiny right and moderate left pleural effusion, the latter of which is partially loculated. Compressive collapse/consolidation in the left lower lobe and to a lesser extent, lingula. There may be tracheobronchomalacia. Upper Abdomen: Liver margin is slightly irregular. Bilateral renal stones and/or renal vascular calcifications. Visualized portions of the liver, gallbladder, adrenal glands, kidneys, spleen, pancreas, stomach and bowel are otherwise grossly unremarkable. No upper abdominal adenopathy. Slight nonspecific haziness in the left upper quadrant omentum. Musculoskeletal: Degenerative changes in the spine. Old sternal fracture. IMPRESSION: 1. Multilobar bronchopneumonia. 2. Tiny right and moderate partially loculated left pleural effusions. Collapse/consolidation in the left lower lobe may be due to  atelectasis. Difficult to exclude pneumonia. 3. 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 4. Liver margin is slightly irregular, raising suspicion for cirrhosis. 5. Bilateral renal stones and/or renal vascular calcifications. 6. Aortic atherosclerosis (ICD10-I70.0). Coronary artery calcification. 7. Enlarged pulmonic trunk, indicative of pulmonary arterial hypertension. Electronically Signed   By: Newell Eke M.D.   On: 10/24/2024 14:15   DG Chest Port 1 View Result Date: 10/24/2024 CLINICAL DATA:  Shortness of breath. EXAM: PORTABLE CHEST 1 VIEW COMPARISON:  10/16/2024 FINDINGS: Low volume film. The cardio pericardial silhouette is enlarged. Retrocardiac collapse/consolidation with effusion is similar to prior. No pulmonary edema. Left IJ central line tip overlies the mid SVC level. IMPRESSION: Low volume film with persistent retrocardiac collapse/consolidation and effusion. Electronically Signed   By: Camellia Candle M.D.   On: 10/24/2024 11:31   DG Chest Port 1 View Result Date: 10/16/2024 EXAM: 1 VIEW(S) XRAY OF THE CHEST 10/16/2024 12:11:00 PM COMPARISON: 01/13/2024 CLINICAL HISTORY: sob FINDINGS: LINES, TUBES AND DEVICES: Tunneled left IJ hemodialysis catheter with tip at superior cavoatrial junction. LUNGS AND PLEURA: Low lung volumes. New left retrocardiac airspace opacity. Possible small left pleural effusion. No pneumothorax. HEART AND MEDIASTINUM: The cardiac silhouette is unremarkable. Aortic atherosclerosis is present. BONES AND SOFT TISSUES: No acute osseous abnormality. IMPRESSION: 1. New left retrocardiac airspace opacity and possible small left pleural effusion. 2. Low lung volumes. Electronically signed by: Evalene Coho MD 10/16/2024 12:16 PM EST RP Workstation: [email or web address] Alm Schneider, DO  [facility]  If 7PM-7AM, please contact night-coverage [email or web address] Password [email or web address] 10/29/2024, 1:37 PM   LOS: 5 days

## 2024-10-29 NOTE — Progress Notes (Addendum)
 [facility]  Patient seen and examined.  He is resting comfortably in bed eating his lunch.  He denies any pain associated with his drain removal.  He denies any drainage.  Exam: Chest: left tunneled drain removal site C/D/I with a small amount of drainage on the gauze, non tender to palpation; no erythema, drainage, or induration  Assessment and Plan:  Patient is a 63 year old male who was admitted with multifocal pneumonia and MRSA bacteremia.  S/P bedside removal of left chest tunneled HD catheter on 12/12.   -May leave the area uncovered unless there is drainage -Plan for line holiday for 2 days over the weekend -IR/vascular surgery will need to replace tunneled dialysis catheter on Monday -Care per hospitalist, ID, and nephrology  Dorothyann Brittle, DO [facility] [address] [place], [place] [address] [phone] (office)

## 2024-10-29 NOTE — Plan of Care (Signed)
 Problem: Education: Goal: Knowledge of General Education information will improve Description: Including pain rating scale, medication(s)/side effects and non-pharmacologic comfort measures Outcome: Progressing   Problem: Health Behavior/Discharge Planning: Goal: Ability to manage health-related needs will improve Outcome: Progressing   Problem: Clinical Measurements: Goal: Ability to maintain clinical measurements within normal limits will improve Outcome: Progressing Goal: Will remain free from infection Outcome: Progressing Goal: Diagnostic test results will improve Outcome: Progressing Goal: Respiratory complications will improve Outcome: Progressing Goal: Cardiovascular complication will be avoided Outcome: Progressing   Problem: Activity: Goal: Risk for activity intolerance will decrease Outcome: Progressing   Problem: Nutrition: Goal: Adequate nutrition will be maintained Outcome: Progressing   Problem: Coping: Goal: Level of anxiety will decrease Outcome: Progressing   Problem: Elimination: Goal: Will not experience complications related to bowel motility Outcome: Progressing Goal: Will not experience complications related to urinary retention Outcome: Progressing   Problem: Pain Managment: Goal: General experience of comfort will improve and/or be controlled Outcome: Progressing   Problem: Safety: Goal: Ability to remain free from injury will improve Outcome: Progressing   Problem: Skin Integrity: Goal: Risk for impaired skin integrity will decrease Outcome: Progressing   Problem: Activity: Goal: Ability to tolerate increased activity will improve Outcome: Progressing   Problem: Clinical Measurements: Goal: Ability to maintain a body temperature in the normal range will improve Outcome: Progressing   Problem: Respiratory: Goal: Ability to maintain adequate ventilation will improve Outcome: Progressing Goal: Ability to maintain a clear airway  will improve Outcome: Progressing   Problem: Education: Goal: Ability to describe self-care measures that may prevent or decrease complications (Diabetes Survival Skills Education) will improve Outcome: Progressing Goal: Individualized Educational Video(s) Outcome: Progressing   Problem: Coping: Goal: Ability to adjust to condition or change in health will improve Outcome: Progressing   Problem: Fluid Volume: Goal: Ability to maintain a balanced intake and output will improve Outcome: Progressing   Problem: Health Behavior/Discharge Planning: Goal: Ability to identify and utilize available resources and services will improve Outcome: Progressing Goal: Ability to manage health-related needs will improve Outcome: Progressing   Problem: Metabolic: Goal: Ability to maintain appropriate glucose levels will improve Outcome: Progressing   Problem: Nutritional: Goal: Maintenance of adequate nutrition will improve Outcome: Progressing Goal: Progress toward achieving an optimal weight will improve Outcome: Progressing   Problem: Skin Integrity: Goal: Risk for impaired skin integrity will decrease Outcome: Progressing   Problem: Tissue Perfusion: Goal: Adequacy of tissue perfusion will improve Outcome: Progressing   Problem: Education: Goal: Knowledge of disease and its progression will improve Outcome: Progressing Goal: Individualized Educational Video(s) Outcome: Progressing   Problem: Fluid Volume: Goal: Compliance with measures to maintain balanced fluid volume will improve Outcome: Progressing   Problem: Health Behavior/Discharge Planning: Goal: Ability to manage health-related needs will improve Outcome: Progressing   Problem: Nutritional: Goal: Ability to make healthy dietary choices will improve Outcome: Progressing   Problem: Clinical Measurements: Goal: Complications related to the disease process, condition or treatment will be avoided or  minimized Outcome: Progressing   Problem: Education: Goal: Knowledge of disease or condition will improve Outcome: Progressing Goal: Understanding of medication regimen will improve Outcome: Progressing Goal: Individualized Educational Video(s) Outcome: Progressing   Problem: Activity: Goal: Ability to tolerate increased activity will improve Outcome: Progressing   Problem: Cardiac: Goal: Ability to achieve and maintain adequate cardiopulmonary perfusion will improve Outcome: Progressing   Problem: Health Behavior/Discharge Planning: Goal: Ability to safely manage health-related needs after discharge will improve Outcome: Progressing   Problem: Education: Goal: Ability  to demonstrate management of disease process will improve Outcome: Progressing Goal: Ability to verbalize understanding of medication therapies will improve Outcome: Progressing Goal: Individualized Educational Video(s) Outcome: Progressing

## 2024-10-30 DIAGNOSIS — I5022 Chronic systolic (congestive) heart failure: Secondary | ICD-10-CM | POA: Diagnosis not present

## 2024-10-30 DIAGNOSIS — N186 End stage renal disease: Secondary | ICD-10-CM | POA: Diagnosis not present

## 2024-10-30 LAB — CULTURE, BLOOD (ROUTINE X 2): Special Requests: ADEQUATE

## 2024-10-30 LAB — GLUCOSE, CAPILLARY
Glucose-Capillary: 307 mg/dL — ABNORMAL HIGH (ref 70–99)
Glucose-Capillary: 269 mg/dL — ABNORMAL HIGH (ref 70–99)
Glucose-Capillary: 194 mg/dL — ABNORMAL HIGH (ref 70–99)
Glucose-Capillary: 195 mg/dL — ABNORMAL HIGH (ref 70–99)

## 2024-10-30 MED ORDER — MIDODRINE HCL 5 MG PO TABS
20.0000 mg | ORAL_TABLET | Freq: Three times a day (TID) | ORAL | Status: DC
  Administered 2024-10-30 – 2024-11-06 (×21): 20 mg via ORAL
  Filled 2024-10-30 (×21): qty 4

## 2024-10-30 NOTE — Progress Notes (Addendum)
 [facility] called to set up round trip transport for patient to arrive to [facility] tomorrow 12/15 as close to 7:30am as possible for insertion of tunneled HD catheter.   Per [facility], first truck doesn't leave base until 7:20am. Patient may not arrive to [facility] until after 8:30am.   Receiving physician at [facility] is Marcey Moan.

## 2024-10-30 NOTE — Progress Notes (Signed)
 PROGRESS NOTE  [name] [name] FMW:[id number] DOB: 1961/01/14 DOA: 10/24/2024 PCP: [name], MD  Brief History:  63 y.o. male with medical history significant for ESRD on dialysis, atrial fibrillation, systolic CHF, hypertension, diabetes, LBBB.  Patient presented to the ED with complaints of difficulty breathing yesterday about 2 to 3 weeks ago.  He has a bad cough that has been going on for about 15 years, but has worsened from baseline.  Reported some dizziness over the past few days, present to respectable position, and intermittent.  He denies dizziness at this time.  No chest pain, no lower extremity swelling.  Reports he never smoked cigarettes.  His last dialysis was on Friday- 2/5.  He did not get dialyzed today, and has not taken his medications today which include midodrine .  He was in the ED 12/2 with reports of dysuria.  He makes very little urine.  He was started on a course of ciprofloxacin  which he started 12/3, and has been compliant.  He was also started on doxycycline  11/30 for chest infiltrate-thought more likely to be fluid related.   ED Course: Tmax 98.6.  Heart rate 66-91.  Respirate rate 16.  Blood pressure systolic initially 130s now down to 60s on my evaluation.  O2 sats greater 93% on room air.  Lactic acid 2.1.  WBC 12.9.  Sodium 130.  COVID influenza RSV negative.  Troponin 225 >> 216.  CT chest without contrast-multifocal bronchopneumonia, tiny right and moderate partially loculated left pleural effusion.  Collapse/consolidation in left lower lobe may be due to atelectasis, difficult to exclude pneumonia.  Nephrology consulted, no emergent need for dialysis.  IV ceftriaxone  and azithromycin  started.  500ml N/s bolus given.   Assessment/Plan: Severe sepsis with septic shock  - secondary to multifocal pneumonia and MRSA bacteremia - blood cultures positive for MRSA - continue IV vancomycin  - ID consultation noted - remains on IV norepinephrine  infusion  at 3 mcg/kg/min - weaned off norepi - continue midodrine  20 mg TID   MRSA bacteremia  - 10/24/24 blood culture = MRSA - 10/26/24--blood culture = neg - 10/28/24 blood culture negative - HD cath removed on 10/28/24 - arterial line removed 10/28/24 - 12/11 TTE--EF 20-25%, G2DD, mild decrease RVF; severe AS, no vegetation - Continue line holiday on sat/sun - planning TEE 12/15 or 12/16   Left pleural effusion--transudate -10/26/2024--left thoracocentesis>> sent for cell count and cultures -does not appear to be empyema   Chronic hypotension  - he is normally on midodrine  TID - he now presents with sepsis physiology - he is on IV norepinephrine  now at 4 mcg/kg/min>>2 - wean off pressor as able  - continue midodrine  20 mg TID   ESRD on HD MWF - pt had inpatient HD 12/12 - consultation from nephrologist   Chronic HFrEF - volume management mostly with HD -- 12/11 TTE--EF 20-25%, G2DD, mild decrease RVF; severe AS, no vegetation - makes small amount of urine - compensated - ESRD and low BPs limit GDMT   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   Objective: Vitals:   10/30/24 0730 10/30/24 0735 10/30/24 0900 10/30/24 1121  BP: (!) 118/25  (!) 121/35 (!) 116/28  Pulse: 77  77 77  Resp:   20 (!) 25  Temp:  98.4 F (36.9 C)  97.9 F (36.6 C)  TempSrc:  Oral  Oral  SpO2:   95% 94%  Weight:       Height:        Intake/Output Summary (Last 24 hours) at 10/30/2024 1304 Last data filed at 10/30/2024 0540 Gross per 24 hour  Intake 150 ml  Output 0 ml  Net 150 ml   Weight change:  Exam:  General:  Pt is alert, follows commands appropriately, not in acute distress HEENT: No icterus, No thrush, No neck mass, [place]/AT Cardiovascular: RRR, S1/S2, no rubs, no gallops Respiratory:fine bibasilar rales. No wheeze Abdomen: Soft/+BS, non tender, non distended, no guarding Extremities: No edema, No lymphangitis, No petechiae, No rashes, no synovitis  R-BKA without erythema or drainage   Data Reviewed: I have personally reviewed following labs and imaging studies Basic Metabolic Panel: Recent Labs  Lab 10/24/24 1112 10/25/24 0442 10/28/24 0749 10/29/24 0343  NA 130* 130* 130* 130*  K 3.7 4.1 3.9 3.9  CL 87* 88* 90* 91*  CO2 22 19* 25 25  GLUCOSE 105* 215* 234* 270*  BUN 51* 63* 59* 38*  CREATININE 10.50* 11.20* 10.90* 7.71*  CALCIUM  9.6 9.2 7.8* 8.3*  MG  --   --   --  2.4  PHOS  --  8.0* 5.5*  --    Liver Function Tests: Recent Labs  Lab 10/24/24 1457 10/25/24 0442 10/28/24 0749 10/29/24 0343  AST 23  --   --  31  ALT 10  --   --  11  ALKPHOS 118  --   --  177*  BILITOT 2.4*  --   --  1.4*  PROT 7.8  --   --  7.4  ALBUMIN  3.0* 3.0* 2.8* 2.8*   No results for input(s): LIPASE, AMYLASE in the last 168 hours. No results for input(s): AMMONIA in the last 168 hours. Coagulation Profile: No results for input(s): INR, PROTIME in the last 168 hours. CBC: Recent Labs  Lab 10/24/24 1014 10/25/24 0442 10/28/24 0749 10/29/24 0343  WBC 10.9* 16.1* 9.1 10.2  NEUTROABS 8.0*  --   --   --   HGB 14.0 12.6* 12.0* 12.4*  HCT 43.1 37.8* 36.2* 38.0*  MCV 75.3* 74.6* 73.9* 73.8*  PLT 146* 185 208 219   Cardiac Enzymes: No results for input(s): CKTOTAL, CKMB, CKMBINDEX, TROPONINI in the last 168 hours. BNP: Invalid input(s): POCBNP CBG: Recent Labs   Lab 10/29/24 1125 10/29/24 1629 10/29/24 2125 10/30/24 0735 10/30/24 1128  GLUCAP 291* 188* 233* 307* 269*   HbA1C: No results for input(s): HGBA1C in the last 72 hours. Urine analysis:    Component Value Date/Time   COLORURINE AMBER (A) 07/01/2022 1143   APPEARANCEUR CLEAR 07/01/2022 1143   LABSPEC 1.017 07/01/2022 1143   PHURINE 7.0 07/01/2022 1143   GLUCOSEU NEGATIVE 07/01/2022 1143   HGBUR NEGATIVE 07/01/2022 1143   BILIRUBINUR NEGATIVE 07/01/2022 1143   KETONESUR NEGATIVE 07/01/2022 1143   PROTEINUR >=300 (A) 07/01/2022 1143   NITRITE NEGATIVE 07/01/2022 1143  LEUKOCYTESUR NEGATIVE 07/01/2022 1143   Sepsis Labs: @LABRCNTIP (procalcitonin:4,lacticidven:4) ) Recent Results (from the past 240 hours)  Resp panel by RT-PCR (RSV, Flu A&B, Covid) Anterior Nasal Swab     Status: None   Collection Time: 10/24/24 10:20 AM   Specimen: Anterior Nasal Swab  Result Value Ref Range Status   SARS Coronavirus 2 by RT PCR NEGATIVE NEGATIVE Final    Comment: (NOTE) SARS-CoV-2 target nucleic acids are NOT DETECTED.  The SARS-CoV-2 RNA is generally detectable in upper respiratory specimens during the acute phase of infection. The lowest concentration of SARS-CoV-2 viral copies this assay can detect is 138 copies/mL. A negative result does not preclude SARS-Cov-2 infection and should not be used as the sole basis for treatment or other patient management decisions. A negative result may occur with  improper specimen collection/handling, submission of specimen other than nasopharyngeal swab, presence of viral mutation(s) within the areas targeted by this assay, and inadequate number of viral copies(<138 copies/mL). A negative result must be combined with clinical observations, patient history, and epidemiological information. The expected result is Negative.  Fact Sheet for Patients:  bloggercourse.com  Fact Sheet for Healthcare Providers:   seriousbroker.it  This test is no t yet approved or cleared by the [place]  FDA and  has been authorized for detection and/or diagnosis of SARS-CoV-2 by FDA under an Emergency Use Authorization (EUA). This EUA will remain  in effect (meaning this test can be used) for the duration of the COVID-19 declaration under Section 564(b)(1) of the Act, 21 U.S.C.section 360bbb-3(b)(1), unless the authorization is terminated  or revoked sooner.       Influenza A by PCR NEGATIVE NEGATIVE Final   Influenza B by PCR NEGATIVE NEGATIVE Final    Comment: (NOTE) The Xpert Xpress SARS-CoV-2/FLU/RSV plus assay is intended as an aid in the diagnosis of influenza from Nasopharyngeal swab specimens and should not be used as a sole basis for treatment. Nasal washings and aspirates are unacceptable for Xpert Xpress SARS-CoV-2/FLU/RSV testing.  Fact Sheet for Patients: bloggercourse.com  Fact Sheet for Healthcare Providers: seriousbroker.it  This test is not yet approved or cleared by the [place]  FDA and has been authorized for detection and/or diagnosis of SARS-CoV-2 by FDA under an Emergency Use Authorization (EUA). This EUA will remain in effect (meaning this test can be used) for the duration of the COVID-19 declaration under Section 564(b)(1) of the Act, 21 U.S.C. section 360bbb-3(b)(1), unless the authorization is terminated or revoked.     Resp Syncytial Virus by PCR NEGATIVE NEGATIVE Final    Comment: (NOTE) Fact Sheet for Patients: bloggercourse.com  Fact Sheet for Healthcare Providers: seriousbroker.it  This test is not yet approved or cleared by the [place]  FDA and has been authorized for detection and/or diagnosis of SARS-CoV-2 by FDA under an Emergency Use Authorization (EUA). This EUA will remain in effect (meaning this test can be used) for  the duration of the COVID-19 declaration under Section 564(b)(1) of the Act, 21 U.S.C. section 360bbb-3(b)(1), unless the authorization is terminated or revoked.  Performed at [facility], [address] [place], [place] [address]   Culture, blood (routine x 2)     Status: Abnormal   Collection Time: 10/24/24  2:07 PM   Specimen: BLOOD RIGHT FOREARM  Result Value Ref Range Status   Specimen Description   Final    BLOOD RIGHT FOREARM BOTTLES DRAWN AEROBIC AND ANAEROBIC Performed at [facility], [address], [place], [place] [address]    Special  Requests   Final    Blood Culture results may not be optimal due to an inadequate volume of blood received in culture bottles Performed at [facility], [address], [place], [place] [address]    Culture  Setup Time   Final    IN BOTH AEROBIC AND ANAEROBIC BOTTLES GRAM POSITIVE COCCI IN CLUSTERS Gram Stain Report Called to,Read Back By and Verified With: [facility] RN 10/25/24 @0709  BY J. WHITE Performed at [facility], [address], [place], [place] [address]    Culture (A)  Final    STAPHYLOCOCCUS AUREUS SUSCEPTIBILITIES PERFORMED ON PREVIOUS CULTURE WITHIN THE LAST 5 DAYS. Performed at [facility], [address] [place], [place] [address]    Report Status 10/27/2024 FINAL  Final  Culture, blood (routine x 2)     Status: Abnormal (Preliminary result)   Collection Time: 10/24/24  2:51 PM   Specimen: BLOOD  Result Value Ref Range Status   Specimen Description   Final    BLOOD BLOOD RIGHT ARM Performed at [facility], [address] [place], [place] [address]    Special Requests   Final    BOTTLES DRAWN AEROBIC AND ANAEROBIC Blood Culture adequate volume Performed at [facility], [address], [place], [place] [address]    Culture  Setup Time   Final    IN BOTH AEROBIC AND ANAEROBIC BOTTLES GRAM POSITIVE COCCI IN CLUSTERS Gram Stain Report Called to,Read Back By and Verified With: K. [facility] RN 10/25/24 @0709  BY  J. WHITE CRITICAL RESULT CALLED TO, READ BACK BY AND VERIFIED WITH: PHARMD LAURIE POOLE 10/25/24 @1034  PR    Culture (A)  Final    METHICILLIN RESISTANT STAPHYLOCOCCUS AUREUS Sent to [facility] for further susceptibility testing. Performed at [facility], [address], [place], [place] [address]    Report Status PENDING  Incomplete   Organism ID, Bacteria METHICILLIN RESISTANT STAPHYLOCOCCUS AUREUS  Final      Susceptibility   Methicillin resistant staphylococcus aureus - MIC*    CIPROFLOXACIN  >=8 RESISTANT Resistant     ERYTHROMYCIN >=8 RESISTANT Resistant     GENTAMICIN <=0.5 SENSITIVE Sensitive     OXACILLIN >=4 RESISTANT Resistant     TETRACYCLINE >=16 RESISTANT Resistant     VANCOMYCIN  1 SENSITIVE Sensitive     TRIMETH/SULFA <=10 SENSITIVE Sensitive     CLINDAMYCIN >=8 RESISTANT Resistant     RIFAMPIN <=0.5 SENSITIVE Sensitive     Inducible Clindamycin NEGATIVE Sensitive     LINEZOLID 2 SENSITIVE Sensitive     * METHICILLIN RESISTANT STAPHYLOCOCCUS AUREUS  Blood Culture ID Panel (Reflexed)     Status: Abnormal   Collection Time: 10/24/24  2:51 PM  Result Value Ref Range Status   Enterococcus faecalis NOT DETECTED NOT DETECTED Final   Enterococcus Faecium NOT DETECTED NOT DETECTED Final   Listeria monocytogenes NOT DETECTED NOT DETECTED Final   Staphylococcus species DETECTED (A) NOT DETECTED Final    Comment: CRITICAL RESULT CALLED TO, READ BACK BY AND VERIFIED WITH: PHARMD LAURIE POOLE 10/25/24 @1034  PR    Staphylococcus aureus (BCID) DETECTED (A) NOT DETECTED Final    Comment: Methicillin (oxacillin)-resistant Staphylococcus aureus (MRSA). MRSA is predictably resistant to beta-lactam antibiotics (except ceftaroline). Preferred therapy is vancomycin  unless clinically contraindicated. Patient requires contact precautions if  hospitalized. CRITICAL RESULT CALLED TO, READ BACK BY AND VERIFIED WITH: PHARMD LAURIE POOLE 10/25/24 @1034  PR    Staphylococcus epidermidis  NOT DETECTED NOT DETECTED Final   Staphylococcus lugdunensis NOT  DETECTED NOT DETECTED Final   Streptococcus species NOT DETECTED NOT DETECTED Final   Streptococcus agalactiae NOT DETECTED NOT DETECTED Final   Streptococcus pneumoniae NOT DETECTED NOT DETECTED Final   Streptococcus pyogenes NOT DETECTED NOT DETECTED Final   A.calcoaceticus-baumannii NOT DETECTED NOT DETECTED Final   Bacteroides fragilis NOT DETECTED NOT DETECTED Final   Enterobacterales NOT DETECTED NOT DETECTED Final   Enterobacter cloacae complex NOT DETECTED NOT DETECTED Final   Escherichia coli NOT DETECTED NOT DETECTED Final   Klebsiella aerogenes NOT DETECTED NOT DETECTED Final   Klebsiella oxytoca NOT DETECTED NOT DETECTED Final   Klebsiella pneumoniae NOT DETECTED NOT DETECTED Final   Proteus species NOT DETECTED NOT DETECTED Final   Salmonella species NOT DETECTED NOT DETECTED Final   Serratia marcescens NOT DETECTED NOT DETECTED Final   Haemophilus influenzae NOT DETECTED NOT DETECTED Final   Neisseria meningitidis NOT DETECTED NOT DETECTED Final   Pseudomonas aeruginosa NOT DETECTED NOT DETECTED Final   Stenotrophomonas maltophilia NOT DETECTED NOT DETECTED Final   Candida albicans NOT DETECTED NOT DETECTED Final   Candida auris NOT DETECTED NOT DETECTED Final   Candida glabrata NOT DETECTED NOT DETECTED Final   Candida krusei NOT DETECTED NOT DETECTED Final   Candida parapsilosis NOT DETECTED NOT DETECTED Final   Candida tropicalis NOT DETECTED NOT DETECTED Final   Cryptococcus neoformans/gattii NOT DETECTED NOT DETECTED Final   Meth resistant mecA/C and MREJ DETECTED (A) NOT DETECTED Final    Comment: CRITICAL RESULT CALLED TO, READ BACK BY AND VERIFIED WITH: PHARMD LAURIE POOLE 10/25/24 @1034  PR Performed at [facility], [address], [place], [place] [address]   MIC (1 Drug)-     Status: Abnormal (Preliminary result)   Collection Time: 10/24/24  2:51 PM  Result Value Ref Range Status   Min  Inhibitory Conc (1 Drug) Preliminary report (A)  Final    Comment: (NOTE) Performed At: [facility] [address] [place], [place] [id number] Jennette Shorter MD Ey:[id number]    Source PENDING  Incomplete  MIC Result     Status: Abnormal   Collection Time: 10/24/24  2:51 PM  Result Value Ref Range Status   Result 1 (MIC) Comment (A)  Final    Comment: (NOTE) Methicillin - resistant Staphylococcus aureus Identification performed by account, not confirmed by this laboratory. DAPTOMYCIN Performed At: [facility] [address] [place], [place] [id number] Jennette Shorter MD Ey:[id number]   MRSA Next Gen by PCR, Nasal     Status: Abnormal   Collection Time: 10/24/24  4:33 PM   Specimen: Nasal Mucosa; Nasal Swab  Result Value Ref Range Status   MRSA by PCR Next Gen DETECTED (A) NOT DETECTED Final    Comment: RESULT CALLED TO, READ BACK BY AND VERIFIED WITH: C KINSLEY,RN@1950  1208/25 MK (NOTE) The GeneXpert MRSA Assay (FDA approved for NASAL specimens only), is one component of a comprehensive MRSA colonization surveillance program. It is not intended to diagnose MRSA infection nor to guide or monitor treatment for MRSA infections. Test performance is not FDA approved in patients less than 54 years old. Performed at [facility], [address] [place], [place] [address]   Culture, blood (Routine X 2) w Reflex to ID Panel     Status: None (Preliminary result)   Collection Time: 10/26/24  7:10 AM   Specimen: BLOOD  Result Value Ref Range Status   Specimen Description BLOOD RT ALINE  Final   Special Requests   Final    BOTTLES  DRAWN AEROBIC ONLY Blood Culture results may not be optimal due to an inadequate volume of blood received in culture bottles   Culture   Final    NO GROWTH 4 DAYS Performed at [facility], [address] [place], [place] [address]    Report Status PENDING  Incomplete  Culture, blood (Routine X 2) w Reflex to ID Panel     Status: None  (Preliminary result)   Collection Time: 10/26/24  7:10 AM   Specimen: BLOOD  Result Value Ref Range Status   Specimen Description BLOOD BLOOD RIGHT ARM  Final   Special Requests   Final    BOTTLES DRAWN AEROBIC AND ANAEROBIC Blood Culture adequate volume   Culture   Final    NO GROWTH 4 DAYS Performed at [facility], [address], [place], [place] [address]    Report Status PENDING  Incomplete  Gram stain     Status: None   Collection Time: 10/26/24  1:05 PM   Specimen: Pleura  Result Value Ref Range Status   Specimen Description PLEURAL LEFT  Final   Special Requests PLEURAL LEFT  Final   Gram Stain   Final    PLEURAL CYTOSPIN SMEAR NO ORGANISMS SEEN WBC PRESENT,BOTH PMN AND MONONUCLEAR Performed at [facility], [address], [place], [place] [address]    Report Status 10/26/2024 FINAL  Final  Culture, body fluid w Gram Stain-bottle     Status: None (Preliminary result)   Collection Time: 10/26/24  1:05 PM   Specimen: Pleura  Result Value Ref Range Status   Specimen Description PLEURAL  Final   Special Requests   Final    BOTTLES DRAWN AEROBIC AND ANAEROBIC Blood Culture adequate volume   Culture   Final    NO GROWTH 4 DAYS Performed at [facility], [address], [place], [place] [address]    Report Status PENDING  Incomplete  Culture, blood (Routine X 2) w Reflex to ID Panel     Status: None (Preliminary result)   Collection Time: 10/28/24  3:23 PM   Specimen: BLOOD  Result Value Ref Range Status   Specimen Description BLOOD BLOOD RIGHT ARM  Final   Special Requests   Final    BOTTLES DRAWN AEROBIC AND ANAEROBIC Blood Culture adequate volume   Culture   Final    NO GROWTH 2 DAYS Performed at [facility], [address] [place], [place] [address]    Report Status PENDING  Incomplete  Culture, blood (Routine X 2) w Reflex to ID Panel     Status: None (Preliminary result)   Collection Time: 10/28/24  3:23 PM   Specimen: BLOOD  Result Value Ref Range Status    Specimen Description BLOOD RIGHT ANTECUBITAL  Final   Special Requests   Final    BOTTLES DRAWN AEROBIC AND ANAEROBIC Blood Culture adequate volume   Culture   Final    NO GROWTH 2 DAYS Performed at [facility], [address] [place], [place] [address]    Report Status PENDING  Incomplete     Scheduled Meds:  aspirin  EC  81 mg Oral Q breakfast   Chlorhexidine  Gluconate Cloth  6 each Topical Q0600   cinacalcet   60 mg Oral Q breakfast   gabapentin   300 mg Oral Daily   heparin   5,000 Units Subcutaneous Q8H   insulin  aspart  0-5 Units Subcutaneous QHS   insulin  aspart  0-9 Units Subcutaneous TID WC   sevelamer  carbonate  4,000 mg Oral  TID WC   vancomycin  variable dose per unstable renal function (pharmacist dosing)   Does not apply See admin instructions   Continuous Infusions:  anticoagulant sodium citrate      norepinephrine  (LEVOPHED ) Adult infusion Stopped (10/29/24 1233)   vancomycin  Stopped (10/28/24 1459)    Procedures/Studies: ECHOCARDIOGRAM COMPLETE Result Date: 10/28/2024    ECHOCARDIOGRAM REPORT   Patient Name:   ERNIE KASLER Sr. Date of Exam: 10/27/2024 Medical Rec #:  [id number]         Height:       73.0 in Accession #:    [id number]        Weight:       230.2 lb Date of Birth:  [birth year]        BSA:          2.284 m Patient Age:    63 years          BP:           96/50 mmHg Patient Gender: M                 HR:           62 bpm. Exam Location:  [facility] Procedure: 2D Echo (Both Spectral and Color Flow Doppler were utilized during            procedure). Indications:    Bacteremia R78.81  History:        Patient has prior history of Echocardiogram examinations, most                 recent 11/09/2023. Cardiomyopathy and CHF, CAD and Previous                 Myocardial Infarction, Arrythmias:Atrial Flutter and LBBB; Risk                 Factors:Diabetes and Hypertension. ESRD on dialysis [facility]).  Sonographer:    Aida Pizza RCS Referring Phys: [id number] CORNELIUS N VAN DAM   Sonographer Comments: Global longitudinal strain was attempted. IMPRESSIONS  1. Left ventricular ejection fraction, by estimation, is 20 to 25%. The left ventricle has severely decreased function. The left ventricle demonstrates global hypokinesis. There is mild left ventricular hypertrophy. Left ventricular diastolic parameters  are consistent with Grade II diastolic dysfunction (pseudonormalization). Elevated left atrial pressure.  2. Right ventricular systolic function is mildly reduced. The right ventricular size is moderately enlarged. There is moderately elevated pulmonary artery systolic pressure.  3. The mitral valve is abnormal. Moderate mitral valve regurgitation. No evidence of mitral stenosis. Moderate mitral annular calcification.  4. The tricuspid valve is abnormal. Tricuspid valve regurgitation is moderate.  5. Severe low flow low gradient aortic stenosis. The aortic valve has an indeterminant number of cusps. There is severe calcifcation of the aortic valve. There is severe thickening of the aortic valve. Aortic valve regurgitation is mild to moderate. Severe aortic valve stenosis. Aortic valve area, by VTI measures 0.77 cm. Aortic valve mean gradient measures 24.0 mmHg. Aortic valve Vmax measures 3.20 m/s.  6. The inferior vena cava is dilated in size with <50% respiratory variability, suggesting right atrial pressure of 15 mmHg. FINDINGS  Left Ventricle: Left ventricular ejection fraction, by estimation, is 20 to 25%. The left ventricle has severely decreased function. The left ventricle demonstrates global hypokinesis. Strain was performed and the global longitudinal strain is indeterminate. The left ventricular internal cavity size was normal in size. There is mild left ventricular hypertrophy. Left ventricular diastolic parameters are consistent  with Grade II diastolic dysfunction (pseudonormalization). Elevated left atrial pressure. Right Ventricle: The right ventricular size is moderately  enlarged. Right vetricular wall thickness was not well visualized. Right ventricular systolic function is mildly reduced. There is moderately elevated pulmonary artery systolic pressure. The tricuspid regurgitant velocity is 3.11 m/s, and with an assumed right atrial pressure of 15 mmHg, the estimated right ventricular systolic pressure is 53.7 mmHg. Left Atrium: Left atrial size was normal in size. Right Atrium: Right atrial size was normal in size. Pericardium: There is no evidence of pericardial effusion. Mitral Valve: The MR vena contracta is 0.4 cm. The mitral valve is abnormal. There is moderate thickening of the mitral valve leaflet(s). There is moderate calcification of the mitral valve leaflet(s). Moderate mitral annular calcification. Moderate mitral valve regurgitation. No evidence of mitral valve stenosis. Tricuspid Valve: The tricuspid valve is abnormal. Tricuspid valve regurgitation is moderate . No evidence of tricuspid stenosis. Aortic Valve: Severe low flow low gradient aortic stenosis. The aortic valve has an indeterminant number of cusps. There is severe calcifcation of the aortic valve. There is severe thickening of the aortic valve. There is severe aortic valve annular calcification. Aortic valve regurgitation is mild to moderate. Aortic regurgitation PHT measures 435 msec. Severe aortic stenosis is present. Aortic valve mean gradient measures 24.0 mmHg. Aortic valve peak gradient measures 41.0 mmHg. Aortic valve area,  by VTI measures 0.77 cm. Pulmonic Valve: The pulmonic valve was not well visualized. Pulmonic valve regurgitation is mild. No evidence of pulmonic stenosis. Aorta: The aortic root is normal in size and structure. Venous: The inferior vena cava is dilated in size with less than 50% respiratory variability, suggesting right atrial pressure of 15 mmHg. IAS/Shunts: No atrial level shunt detected by color flow Doppler. Additional Comments: 3D was performed not requiring image post  processing on an independent workstation and was abnormal.  LEFT VENTRICLE PLAX 2D LVIDd:         5.75 cm LVIDs:         5.30 cm LV PW:         1.10 cm LV IVS:        1.10 cm LVOT diam:     2.00 cm   3D Volume EF: LV SV:         49        3D EF:        38 % LV SV Index:   21        LV EDV:       217 ml LVOT Area:     3.14 cm  LV ESV:       134 ml                          LV SV:        83 ml RIGHT VENTRICLE TAPSE (M-mode): 1.4 cm LEFT ATRIUM             Index        RIGHT ATRIUM           Index LA diam:        4.90 cm 2.15 cm/m   RA Area:     18.70 cm LA Vol (A2C):   92.0 ml 40.28 ml/m  RA Volume:   56.00 ml  24.52 ml/m LA Vol (A4C):   70.6 ml 30.91 ml/m LA Biplane Vol: 81.3 ml 35.60 ml/m  AORTIC VALVE AV Area (Vmax):    0.69 cm  AV Area (Vmean):   0.64 cm AV Area (VTI):     0.77 cm AV Vmax:           320.00 cm/s AV Vmean:          232.000 cm/s AV VTI:            0.635 m AV Peak Grad:      41.0 mmHg AV Mean Grad:      24.0 mmHg LVOT Vmax:         70.30 cm/s LVOT Vmean:        47.600 cm/s LVOT VTI:          0.155 m LVOT/AV VTI ratio: 0.24 AI PHT:            435 msec  AORTA Ao Root diam: 3.70 cm MITRAL VALVE                  TRICUSPID VALVE MV Area (PHT): 4.60 cm       TR Peak grad:   38.7 mmHg MV Decel Time: 165 msec       TR Vmax:        311.00 cm/s MR Peak grad:    108.2 mmHg MR Mean grad:    64.0 mmHg    SHUNTS MR Vmax:         520.00 cm/s  Systemic VTI:  0.16 m MR Vmean:        370.0 cm/s   Systemic Diam: 2.00 cm MR PISA:         1.90 cm MR PISA Eff ROA: 11 mm MR PISA Radius:  0.55 cm MV E velocity: 145.00 cm/s MV A velocity: 70.20 cm/s MV E/A ratio:  2.07 Dorn Ross MD Electronically signed by Dorn Ross MD Signature Date/Time: 10/28/2024/2:06:38 PM    Final    US  THORACENTESIS ASP PLEURAL SPACE W/IMG GUIDE Result Date: 10/26/2024 INDICATION: Patient admitted 10/24/2024 with severe sepsis, MRSA bacteremia and new left pleural effusion. Request for diagnostic and therapeutic left  thoracentesis. EXAM: ULTRASOUND GUIDED LEFT THORACENTESIS MEDICATIONS: 6 mL 1% lidocaine  COMPLICATIONS: None immediate. PROCEDURE: An ultrasound guided thoracentesis was thoroughly discussed with the patient and questions answered. The benefits, risks, alternatives and complications were also discussed. The patient understands and wishes to proceed with the procedure. Written consent was obtained. Ultrasound was performed to localize and mark an adequate pocket of fluid in the left chest. The area was then prepped and draped in the normal sterile fashion. 1% Lidocaine  was used for local anesthesia. Under ultrasound guidance a 19 gauge, 7-cm, Yueh catheter was introduced. Thoracentesis was performed. The catheter was removed and a dressing applied. FINDINGS: A total of approximately 450 mL of clear yellow fluid was removed. Samples were sent to the laboratory as requested by the clinical team. IMPRESSION: Successful ultrasound guided left thoracentesis yielding 450 mL of pleural fluid. Performed by Clotilda Hesselbach, PA-C Electronically Signed   By: Ester Sides M.D.   On: 10/26/2024 16:13   DG Chest 1 View Result Date: 10/26/2024 EXAM: 1 VIEW(S) XRAY OF THE CHEST 10/26/2024 01:55:07 PM COMPARISON: 2 days ago. CLINICAL HISTORY: [id number] Status post thoracentesis [id number] Status post thoracentesis. FINDINGS: LINES, TUBES AND DEVICES: Stable left internal jugular dialysis catheter. LUNGS AND PLEURA: New right upper lobe opacity is noted concerning for possible infiltrate. Stable left basilar atelectasis and possible small pleural effusion is noted. No pneumothorax is noted. HEART AND MEDIASTINUM: Stable cardiomegaly. BONES AND SOFT TISSUES: No acute osseous abnormality. IMPRESSION: 1. New right  upper lobe opacity, concerning for possible pneumonia. 2. Stable left basilar atelectasis with possible small pleural effusion. Electronically signed by: [name] Seip MD 10/26/2024 02:38 PM EST RP Workstation: [id number]   CT  Chest Wo Contrast Result Date: 10/24/2024 CLINICAL DATA:  Pneumonia. Shortness of breath after dialysis Friday. EXAM: CT CHEST WITHOUT CONTRAST TECHNIQUE: Multidetector CT imaging of the chest was performed following the standard protocol without IV contrast. RADIATION DOSE REDUCTION: This exam was performed according to the departmental dose-optimization program which includes automated exposure control, adjustment of the mA and/or kV according to patient size and/or use of iterative reconstruction technique. COMPARISON:  11/08/2023. FINDINGS: Cardiovascular: Atherosclerotic calcification of the aorta, aortic valve and coronary arteries. Enlarged pulmonic trunk and heart. No pericardial effusion. Left IJ dialysis catheter terminates in the right atrium. Mediastinum/Nodes: No pathologically enlarged mediastinal or axillary lymph nodes. Hilar regions are difficult to definitively evaluate without IV contrast. Esophagus is grossly unremarkable. Lungs/Pleura: Image quality is degraded by expiratory phase imaging and respiratory motion. Peribronchovascular ground-glass and consolidation in the anterior segment right upper lobe and likely elsewhere within the right middle and right lower lobes. Tiny right and moderate left pleural effusion, the latter of which is partially loculated. Compressive collapse/consolidation in the left lower lobe and to a lesser extent, lingula. There may be tracheobronchomalacia. Upper Abdomen: Liver margin is slightly irregular. Bilateral renal stones and/or renal vascular calcifications. Visualized portions of the liver, gallbladder, adrenal glands, kidneys, spleen, pancreas, stomach and bowel are otherwise grossly unremarkable. No upper abdominal adenopathy. Slight nonspecific haziness in the left upper quadrant omentum. Musculoskeletal: Degenerative changes in the spine. Old sternal fracture. IMPRESSION: 1. Multilobar bronchopneumonia. 2. Tiny right and moderate partially loculated left  pleural effusions. Collapse/consolidation in the left lower lobe may be due to atelectasis. Difficult to exclude pneumonia. 3. 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 4. Liver margin is slightly irregular, raising suspicion for cirrhosis. 5. Bilateral renal stones and/or renal vascular calcifications. 6. Aortic atherosclerosis (ICD10-I70.0). Coronary artery calcification. 7. Enlarged pulmonic trunk, indicative of pulmonary arterial hypertension. Electronically Signed   By: Newell Eke M.D.   On: 10/24/2024 14:15   DG Chest Port 1 View Result Date: 10/24/2024 CLINICAL DATA:  Shortness of breath. EXAM: PORTABLE CHEST 1 VIEW COMPARISON:  10/16/2024 FINDINGS: Low volume film. The cardio pericardial silhouette is enlarged. Retrocardiac collapse/consolidation with effusion is similar to prior. No pulmonary edema. Left IJ central line tip overlies the mid SVC level. IMPRESSION: Low volume film with persistent retrocardiac collapse/consolidation and effusion. Electronically Signed   By: Camellia Candle M.D.   On: 10/24/2024 11:31   DG Chest Port 1 View Result Date: 10/16/2024 EXAM: 1 VIEW(S) XRAY OF THE CHEST 10/16/2024 12:11:00 PM COMPARISON: 01/13/2024 CLINICAL HISTORY: sob FINDINGS: LINES, TUBES AND DEVICES: Tunneled left IJ hemodialysis catheter with tip at superior cavoatrial junction. LUNGS AND PLEURA: Low lung volumes. New left retrocardiac airspace opacity. Possible small left pleural effusion. No pneumothorax. HEART AND MEDIASTINUM: The cardiac silhouette is unremarkable. Aortic atherosclerosis is present. BONES AND SOFT TISSUES: No acute osseous abnormality. IMPRESSION: 1. New left retrocardiac airspace opacity and possible small left pleural effusion. 2. Low lung volumes. Electronically signed by: Evalene Coho MD 10/16/2024 12:16 PM EST RP Workstation: DARYLENE Alm Schneider, DO  [facility]  If 7PM-7AM, please contact night-coverage [email or web address] Password [email or web address] 10/30/2024, 1:04 PM   LOS: 6  days

## 2024-10-31 DIAGNOSIS — R7881 Bacteremia: Secondary | ICD-10-CM | POA: Diagnosis not present

## 2024-10-31 DIAGNOSIS — N186 End stage renal disease: Secondary | ICD-10-CM | POA: Diagnosis not present

## 2024-10-31 DIAGNOSIS — B9562 Methicillin resistant Staphylococcus aureus infection as the cause of diseases classified elsewhere: Secondary | ICD-10-CM | POA: Diagnosis not present

## 2024-10-31 DIAGNOSIS — J90 Pleural effusion, not elsewhere classified: Secondary | ICD-10-CM

## 2024-10-31 DIAGNOSIS — J188 Other pneumonia, unspecified organism: Secondary | ICD-10-CM | POA: Diagnosis not present

## 2024-10-31 LAB — GLUCOSE, CAPILLARY
Glucose-Capillary: 180 mg/dL — ABNORMAL HIGH (ref 70–99)
Glucose-Capillary: 153 mg/dL — ABNORMAL HIGH (ref 70–99)
Glucose-Capillary: 122 mg/dL — ABNORMAL HIGH (ref 70–99)
Glucose-Capillary: 163 mg/dL — ABNORMAL HIGH (ref 70–99)
Glucose-Capillary: 141 mg/dL — ABNORMAL HIGH (ref 70–99)

## 2024-10-31 LAB — CULTURE, BLOOD (ROUTINE X 2)
Culture: NO GROWTH
Culture: NO GROWTH
Special Requests: ADEQUATE

## 2024-10-31 LAB — CULTURE, BODY FLUID W GRAM STAIN -BOTTLE
Culture: NO GROWTH
Special Requests: ADEQUATE

## 2024-10-31 LAB — MIC RESULT

## 2024-10-31 MED ORDER — HEPARIN SODIUM (PORCINE) 1000 UNIT/ML IJ SOLN
INTRAMUSCULAR | Status: AC
  Administered 2024-10-31: 13:00:00 1000 [IU]
  Filled 2024-10-31: qty 3

## 2024-10-31 MED ORDER — HEPARIN SODIUM (PORCINE) 5000 UNIT/ML IJ SOLN
INTRAMUSCULAR | Status: AC
  Filled 2024-10-31: qty 1

## 2024-10-31 MED ORDER — VANCOMYCIN IV (FOR PTA / DISCHARGE USE ONLY): 1000.0000 mg | INTRAVENOUS | Status: AC

## 2024-10-31 MED ORDER — GUAIFENESIN-DM 100-10 MG/5ML PO SYRP
5.0000 mL | ORAL_SOLUTION | ORAL | Status: DC | PRN
  Administered 2024-10-31 – 2024-12-13 (×13): 5 mL via ORAL
  Filled 2024-10-31 (×11): qty 5

## 2024-10-31 MED ORDER — HEPARIN SOD (PORK) LOCK FLUSH 100 UNIT/ML IV SOLN
INTRAVENOUS | Status: AC
  Filled 2024-10-31: qty 5

## 2024-10-31 NOTE — Plan of Care (Signed)

## 2024-10-31 NOTE — Progress Notes (Signed)
 [facility]  Patient tolerated the procedure well. Updated the team.   Manuelita Pander, MD [facility] [address] [place], [place] [phone] (office)

## 2024-10-31 NOTE — Progress Notes (Signed)
 [facility] Progress Note     Subjective: Patient's BP is too low for IR to accept him for left tunnled dialysis placement. I was asked by Dr. Evonnie to review  the patient for potential tunneled placement.   The patient had a left tunneled catheter placed by Dr. 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    Objective: Vital signs in last 24 hours: Temp:  [97.7 F (36.5 C)-98.5 F (36.9 C)] 98.4 F (36.9 C) (12/15 0746) Pulse Rate:  [67-97] 70 (12/15 0819) Resp:  [15-31] 22 (12/15 0819) BP: (90-116)/(12-74) 90/33 (12/15 0819) SpO2:  [92 %-99 %] 95 % (12/15 0819) Last BM Date : 10/30/24  Intake/Output from previous day: 12/14 0701 - 12/15 0700 In: 120 [P.O.:120] Out: 0  Intake/Output this shift: No intake/output data recorded.  General appearance: alert and no distress Resp: normal work of breathing ULTRASOUND REVIEW AT BEDSIDE OF VEINS- Right internal jugular vein small and looks almost like 2 collaterals coming in, both of these are < 0.5 cm in size and do not dilate with Trendelenburg position; Left internal jugular vein large >1.5cm in size, more appropriate; right and left femoral veins smaller 0.5-1cm in size   Lab Results:  Recent Labs    10/29/24 0343  WBC 10.2  HGB 12.4*  HCT 38.0*  PLT 219   BMET Recent Labs    10/29/24 0343  NA 130*  K 3.9  CL 91*  CO2 25  GLUCOSE 270*  BUN 38*  CREATININE 7.71*  CALCIUM  8.3*   PT/INR No results for input(s): LABPROT, INR in the last 72 hours.  Studies/Results: No results found.  Anti-infectives: Anti-infectives (From admission, onward)    Start     Dose/Rate Route Frequency Ordered Stop   10/28/24 1400  vancomycin  (VANCOCIN ) IVPB 1000 mg/200 mL premix        1,000 mg 200 mL/hr over 60 Minutes Intravenous Every M-W-F (Hemodialysis) 10/28/24 0814     10/27/24 1719  vancomycin  variable dose per unstable renal function (pharmacist dosing)         Does not apply See admin  instructions 10/27/24 1719     10/25/24 1800  ceFEPIme  (MAXIPIME ) 1 g in sodium chloride  0.9 % 100 mL IVPB  Status:  Discontinued        1 g 200 mL/hr over 30 Minutes Intravenous Every 24 hours 10/24/24 1642 10/25/24 0906   10/25/24 1600  ceFEPIme  (MAXIPIME ) 2 g in sodium chloride  0.9 % 100 mL IVPB  Status:  Discontinued        2 g 200 mL/hr over 30 Minutes Intravenous Every T-Th-Sa (Hemodialysis) 10/25/24 0906 10/25/24 1323   10/25/24 1600  vancomycin  (VANCOCIN ) IVPB 1000 mg/200 mL premix  Status:  Discontinued        1,000 mg 200 mL/hr over 60 Minutes Intravenous Every T-Th-Sa (Hemodialysis) 10/25/24 0908 10/27/24 1719   10/24/24 1800  ceFEPIme  (MAXIPIME ) 2 g in sodium chloride  0.9 % 100 mL IVPB        2 g 200 mL/hr over 30 Minutes Intravenous  Once 10/24/24 1638 10/24/24 1720   10/24/24 1730  vancomycin  (VANCOREADY) IVPB 2000 mg/400 mL        2,000 mg 200 mL/hr over 120 Minutes Intravenous  Once 10/24/24 1638 10/24/24 1926   10/24/24 1639  vancomycin  variable dose per unstable renal function (pharmacist dosing)  Status:  Discontinued         Does not apply See admin instructions 10/24/24 1639 10/26/24  9156   10/24/24 1430  cefTRIAXone  (ROCEPHIN ) 1 g in sodium chloride  0.9 % 100 mL IVPB        1 g 200 mL/hr over 30 Minutes Intravenous  Once 10/24/24 1419 10/24/24 1536   10/24/24 1430  azithromycin  (ZITHROMAX ) 500 mg in sodium chloride  0.9 % 250 mL IVPB        500 mg 250 mL/hr over 60 Minutes Intravenous  Once 10/24/24 1419 10/24/24 1724       Assessment/Plan: Patient with small vessels in the right jugular and bilateral femoral veins. He needs a replaced tunneled catheter on the left and I do not do left sided tunneled catheters at [facility]. Given that he cannot transport due to his blood pressures, I can offer a femoral catheter so that we are not injuring or causing issues with the left jugular that is the largest and needed for outpatient access.  Discussed risk of bleeding,  infection, injury to vessel, temporary placement. Can do at the bedside with local RN getting the supplies.  Patient can eat since no sedation being given.   Discussed with patient and team.   Greater than 50% of the 50 minute visit was spent in counseling/ coordination of care regarding the assessment of his veins with the ultrasound and discussing the catheter and arranging for the catheter placement.    LOS: 7 days    [name] [name] 10/31/2024

## 2024-10-31 NOTE — Progress Notes (Signed)
 Patient ID: [name] [name], male   DOB: 03-Jan-1961, 63 y.o.   MRN: [id number] S: No new complaints.  IR cancelled transfer to [facility] due to low Bp's.  His Bp's have been variable and is off of levophed .  Totally asymptomatic.   O:BP (!) 90/33 Comment: MD aware. pt asymptomatic  Pulse 70   Temp 98.4 F (36.9 C) (Axillary)   Resp (!) 22   Ht 6' 1 (1.854 m)   Wt 105.3 kg   SpO2 95%   BMI 30.63 kg/m   Intake/Output Summary (Last 24 hours) at 10/31/2024 0902 Last data filed at 10/31/2024 0522 Gross per 24 hour  Intake 120 ml  Output 0 ml  Net 120 ml   Intake/Output: I/O last 3 completed shifts: In: 270 [P.O.:270] Out: 0   Intake/Output this shift:  No intake/output data recorded. Weight change:  Gen: NAD CVS: RRR Resp:CTA Abd: +BS, soft, NT/nD Ext: s/p RBKA, LLE with trace pretibial edema  Recent Labs  Lab 10/24/24 1112 10/24/24 1457 10/25/24 0442 10/28/24 0749 10/29/24 0343  NA 130*  --  130* 130* 130*  K 3.7  --  4.1 3.9 3.9  CL 87*  --  88* 90* 91*  CO2 22  --  19* 25 25  GLUCOSE 105*  --  215* 234* 270*  BUN 51*  --  63* 59* 38*  CREATININE 10.50*  --  11.20* 10.90* 7.71*  ALBUMIN   --  3.0* 3.0* 2.8* 2.8*  CALCIUM  9.6  --  9.2 7.8* 8.3*  PHOS  --   --  8.0* 5.5*  --   AST  --  23  --   --  31  ALT  --  10  --   --  11   Liver Function Tests: Recent Labs  Lab 10/24/24 1457 10/25/24 0442 10/28/24 0749 10/29/24 0343  AST 23  --   --  31  ALT 10  --   --  11  ALKPHOS 118  --   --  177*  BILITOT 2.4*  --   --  1.4*  PROT 7.8  --   --  7.4  ALBUMIN  3.0* 3.0* 2.8* 2.8*   No results for input(s): LIPASE, AMYLASE in the last 168 hours. No results for input(s): AMMONIA in the last 168 hours. CBC: Recent Labs  Lab 10/24/24 1014 10/25/24 0442 10/28/24 0749 10/29/24 0343  WBC 10.9* 16.1* 9.1 10.2  NEUTROABS 8.0*  --   --   --   HGB 14.0 12.6* 12.0* 12.4*  HCT 43.1 37.8* 36.2* 38.0*  MCV 75.3* 74.6* 73.9* 73.8*  PLT 146* 185 208 219   Cardiac  Enzymes: No results for input(s): CKTOTAL, CKMB, CKMBINDEX, TROPONINI in the last 168 hours. CBG: Recent Labs  Lab 10/30/24 1128 10/30/24 1610 10/30/24 2119 10/31/24 0240 10/31/24 0733  GLUCAP 269* 194* 195* 180* 153*    Iron  Studies: No results for input(s): IRON , TIBC, TRANSFERRIN, FERRITIN in the last 72 hours. Studies/Results: No results found.  aspirin  EC  81 mg Oral Q breakfast   Chlorhexidine  Gluconate Cloth  6 each Topical Q0600   cinacalcet   60 mg Oral Q breakfast   gabapentin   300 mg Oral Daily   heparin   5,000 Units Subcutaneous Q8H   insulin  aspart  0-5 Units Subcutaneous QHS   insulin  aspart  0-9 Units Subcutaneous TID WC   midodrine   20 mg Oral TID WC   sevelamer  carbonate  4,000 mg Oral TID WC   vancomycin   variable dose per unstable renal function (pharmacist dosing)   Does not apply See admin instructions    BMET    Component Value Date/Time   NA 130 (L) 10/29/2024 0343   K 3.9 10/29/2024 0343   CL 91 (L) 10/29/2024 0343   CO2 25 10/29/2024 0343   GLUCOSE 270 (H) 10/29/2024 0343   BUN 38 (H) 10/29/2024 0343   CREATININE 7.71 (H) 10/29/2024 0343   CALCIUM  8.3 (L) 10/29/2024 0343   GFRNONAA 7 (L) 10/29/2024 0343   GFRAA 3 (L) 08/14/2019 2051   CBC    Component Value Date/Time   WBC 10.2 10/29/2024 0343   RBC 5.15 10/29/2024 0343   HGB 12.4 (L) 10/29/2024 0343   HGB 13.0 12/17/2022 1242   HCT 38.0 (L) 10/29/2024 0343   PLT 219 10/29/2024 0343   PLT 272 12/17/2022 1242   MCV 73.8 (L) 10/29/2024 0343   MCH 24.1 (L) 10/29/2024 0343   MCHC 32.6 10/29/2024 0343   RDW 26.1 (H) 10/29/2024 0343   LYMPHSABS 0.9 10/24/2024 1014   MONOABS 1.6 (H) 10/24/2024 1014   EOSABS 0.4 10/24/2024 1014   BASOSABS 0.1 10/24/2024 1014    Dialysis prescription: [facility], MWF, 180 dialyzer, 4 hours 15 minutes, BFR 400/DFR 800, EDW 101.9 kg, 2K/2.0 calcium , UF profile #4, heparin  6000 unit bolus.  Left IJ TDC.  Hectorol  5 mcg IV q. HD.    On Sensipar  60 mg daily, midodrine  20 mg 3 times daily with option to take extra 20 mg mid to dialysis, Renvela  4000 mg 3 times daily AC.   Assessment/Plan: 1.  Multifocal pneumonia: Negative testing for RSV infection and COVID and started on presumptive treatment for pneumonia based on CT scan findings; on vancomycin  and cefepime  -> Vanc.  Concern raised with 4 out of 4 positive blood cultures with gram-positive cocci.  Awaiting identification/sensitivities; on Vanc, ID onboard as well.  2.  End-stage renal disease: Typically on MWF dialysis schedule and dialysis postponed to Tues (1L net UF). Attempting to wean down Levophed ; his typical dose of midodrine  20 mg 3 times daily.  He does not appear to be hypervolemic on exam or upon review of his imaging but is 2kg up on EDW.     Per ID recs needs at least 48hrs line free, currently still has IV's on right, art line, LIJ TC. Appreciate Dr. Evonnie removed the LIJ TC on 10/28/24.  Will need TDC today. See below.    Discussed with the dialysis nurse and she will come in a little bit early Friday and has the patient on for first shift.   3.  Hypotension: Acute on chronic with evidence of sepsis in the setting of infection/bacteremia.  Resume oral midodrine  dose 20 mg 3 times a day and weaned off of Levophed  drip. Totally asymptomatic.    4.  Anemia: Hemoglobin and hematocrit currently at acceptable range, no indication for ESA.  5.  Secondary hyperparathyroidism: Calcium  level acceptable with significant hyperphosphatemia-resume sevelamer  for phosphorus binding and Sensipar  for PTH control.  Hold Hectorol  for now.  6. Vascular access - had LIJ TDC removed last week for line holiday.  Will need new TDC placement.  IR refused transport to [facility] due to low bp.  Dr. Evonnie discussed case with me and Dr. Kallie who has kindly agreed to place [facility] if possible and if not, temp HD catheter.  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 Hopefully can get RIJ [facility] today with Dr. Kallie and plan for HD afterwards.   [name] RONAL Sellar, MD [facility]

## 2024-10-31 NOTE — Procedures (Signed)
 HD Note:  Some information was entered later than the data was gathered due to patient care needs. The stated time with the data is accurate.  Patient treated at bedside.  Alert and oriented.   Informed consent signed and in chart.   Access used: New temporary femoral trialysis HD catheter Access issues: BFR had to be reduced to 300 to allow treatment to continue. Patient had coughing episodes that caused tensing of his body.  This caused the machine to alarm at times. This did not effect the general pressures during the treatment. This caused the time on the machine to increase.  Patient's BP was quite variable without patient having any symptoms or change of condition.  Cuff was readjusted, different sized cuffs used, and having the patient attend to remain still when the measurement was taken.  BP readings were inconsistent after each intervention. Patient LOC and statements of comfort was the same during entire treatment.    TX duration: 4 hours  Alert, without acute distress.  Total UF removed: 2000 ml  Hand-off given to patient's nurse.    Sherlie Boyum L. Lenon, RN [facility].

## 2024-10-31 NOTE — Progress Notes (Addendum)
 Subjective: Reviewed chart Afebrile  On vancomycin  with dialysis  Tee per team, pending  Pending also new dialysis catheter placement (tunneled cath) per surgery  No new pain or complaints   Antibiotics:  Anti-infectives (From admission, onward)    Start     Dose/Rate Route Frequency Ordered Stop   10/28/24 1400  vancomycin  (VANCOCIN ) IVPB 1000 mg/200 mL premix        1,000 mg 200 mL/hr over 60 Minutes Intravenous Every M-W-F (Hemodialysis) 10/28/24 0814     10/27/24 1719  vancomycin  variable dose per unstable renal function (pharmacist dosing)         Does not apply See admin instructions 10/27/24 1719     10/25/24 1800  ceFEPIme  (MAXIPIME ) 1 g in sodium chloride  0.9 % 100 mL IVPB  Status:  Discontinued        1 g 200 mL/hr over 30 Minutes Intravenous Every 24 hours 10/24/24 1642 10/25/24 0906   10/25/24 1600  ceFEPIme  (MAXIPIME ) 2 g in sodium chloride  0.9 % 100 mL IVPB  Status:  Discontinued        2 g 200 mL/hr over 30 Minutes Intravenous Every T-Th-Sa (Hemodialysis) 10/25/24 0906 10/25/24 1323   10/25/24 1600  vancomycin  (VANCOCIN ) IVPB 1000 mg/200 mL premix  Status:  Discontinued        1,000 mg 200 mL/hr over 60 Minutes Intravenous Every T-Th-Sa (Hemodialysis) 10/25/24 0908 10/27/24 1719   10/24/24 1800  ceFEPIme  (MAXIPIME ) 2 g in sodium chloride  0.9 % 100 mL IVPB        2 g 200 mL/hr over 30 Minutes Intravenous  Once 10/24/24 1638 10/24/24 1720   10/24/24 1730  vancomycin  (VANCOREADY) IVPB 2000 mg/400 mL        2,000 mg 200 mL/hr over 120 Minutes Intravenous  Once 10/24/24 1638 10/24/24 1926   10/24/24 1639  vancomycin  variable dose per unstable renal function (pharmacist dosing)  Status:  Discontinued         Does not apply See admin instructions 10/24/24 1639 10/26/24 0843   10/24/24 1430  cefTRIAXone  (ROCEPHIN ) 1 g in sodium chloride  0.9 % 100 mL IVPB        1 g 200 mL/hr over 30 Minutes Intravenous  Once 10/24/24 1419 10/24/24 1536   10/24/24 1430   azithromycin  (ZITHROMAX ) 500 mg in sodium chloride  0.9 % 250 mL IVPB        500 mg 250 mL/hr over 60 Minutes Intravenous  Once 10/24/24 1419 10/24/24 1724       Medications: Scheduled Meds:  aspirin  EC  81 mg Oral Q breakfast   Chlorhexidine  Gluconate Cloth  6 each Topical Q0600   cinacalcet   60 mg Oral Q breakfast   gabapentin   300 mg Oral Daily   heparin   5,000 Units Subcutaneous Q8H   insulin  aspart  0-5 Units Subcutaneous QHS   insulin  aspart  0-9 Units Subcutaneous TID WC   midodrine   20 mg Oral TID WC   sevelamer  carbonate  4,000 mg Oral TID WC   vancomycin  variable dose per unstable renal function (pharmacist dosing)   Does not apply See admin instructions   Continuous Infusions:  anticoagulant sodium citrate      norepinephrine  (LEVOPHED ) Adult infusion Stopped (10/29/24 1233)   vancomycin  Stopped (10/28/24 1459)   PRN Meds:.acetaminophen  **OR** acetaminophen , alteplase , anticoagulant sodium citrate , heparin , heparin , ipratropium-albuterol , lidocaine  (PF), lidocaine -prilocaine , ondansetron  **OR** ondansetron  (ZOFRAN ) IV, mouth rinse, pentafluoroprop-tetrafluoroeth, polyethylene glycol    Objective: Weight change:   Intake/Output  Summary (Last 24 hours) at 10/31/2024 1117 Last data filed at 10/31/2024 0522 Gross per 24 hour  Intake 120 ml  Output 0 ml  Net 120 ml   Blood pressure (!) 107/29, pulse 69, temperature 98.4 F (36.9 C), temperature source Axillary, resp. rate 19, height 6' 1 (1.854 m), weight 105.3 kg, SpO2 96%. Temp:  [97.7 F (36.5 C)-98.5 F (36.9 C)] 98.4 F (36.9 C) (12/15 0746) Pulse Rate:  [67-97] 69 (12/15 0957) Resp:  [15-31] 19 (12/15 0957) BP: (90-116)/(12-74) 107/29 (12/15 0957) SpO2:  [92 %-99 %] 96 % (12/15 0957)  Physical Exam: Chart reviewed  CBC:    BMET Recent Labs    10/29/24 0343  NA 130*  K 3.9  CL 91*  CO2 25  GLUCOSE 270*  BUN 38*  CREATININE 7.71*  CALCIUM  8.3*     Liver Panel  Recent Labs     10/29/24 0343  PROT 7.4  ALBUMIN  2.8*  AST 31  ALT 11  ALKPHOS 177*  BILITOT 1.4*       Sedimentation Rate No results for input(s): ESRSEDRATE in the last 72 hours. C-Reactive Protein No results for input(s): CRP in the last 72 hours.  Micro Results: Recent Results (from the past 720 hours)  Resp panel by RT-PCR (RSV, Flu A&B, Covid) Anterior Nasal Swab     Status: None   Collection Time: 10/24/24 10:20 AM   Specimen: Anterior Nasal Swab  Result Value Ref Range Status   SARS Coronavirus 2 by RT PCR NEGATIVE NEGATIVE Final    Comment: (NOTE) SARS-CoV-2 target nucleic acids are NOT DETECTED.  The SARS-CoV-2 RNA is generally detectable in upper respiratory specimens during the acute phase of infection. The lowest concentration of SARS-CoV-2 viral copies this assay can detect is 138 copies/mL. A negative result does not preclude SARS-Cov-2 infection and should not be used as the sole basis for treatment or other patient management decisions. A negative result may occur with  improper specimen collection/handling, submission of specimen other than nasopharyngeal swab, presence of viral mutation(s) within the areas targeted by this assay, and inadequate number of viral copies(<138 copies/mL). A negative result must be combined with clinical observations, patient history, and epidemiological information. The expected result is Negative.  Fact Sheet for Patients:  bloggercourse.com  Fact Sheet for Healthcare Providers:  seriousbroker.it  This test is no t yet approved or cleared by the [place]  FDA and  has been authorized for detection and/or diagnosis of SARS-CoV-2 by FDA under an Emergency Use Authorization (EUA). This EUA will remain  in effect (meaning this test can be used) for the duration of the COVID-19 declaration under Section 564(b)(1) of the Act, 21 U.S.C.section 360bbb-3(b)(1), unless the authorization  is terminated  or revoked sooner.       Influenza A by PCR NEGATIVE NEGATIVE Final   Influenza B by PCR NEGATIVE NEGATIVE Final    Comment: (NOTE) The Xpert Xpress SARS-CoV-2/FLU/RSV plus assay is intended as an aid in the diagnosis of influenza from Nasopharyngeal swab specimens and should not be used as a sole basis for treatment. Nasal washings and aspirates are unacceptable for Xpert Xpress SARS-CoV-2/FLU/RSV testing.  Fact Sheet for Patients: bloggercourse.com  Fact Sheet for Healthcare Providers: seriousbroker.it  This test is not yet approved or cleared by the [place]  FDA and has been authorized for detection and/or diagnosis of SARS-CoV-2 by FDA under an Emergency Use Authorization (EUA). This EUA will remain in effect (meaning this test can be used) for the  duration of the COVID-19 declaration under Section 564(b)(1) of the Act, 21 U.S.C. section 360bbb-3(b)(1), unless the authorization is terminated or revoked.     Resp Syncytial Virus by PCR NEGATIVE NEGATIVE Final    Comment: (NOTE) Fact Sheet for Patients: bloggercourse.com  Fact Sheet for Healthcare Providers: seriousbroker.it  This test is not yet approved or cleared by the [place]  FDA and has been authorized for detection and/or diagnosis of SARS-CoV-2 by FDA under an Emergency Use Authorization (EUA). This EUA will remain in effect (meaning this test can be used) for the duration of the COVID-19 declaration under Section 564(b)(1) of the Act, 21 U.S.C. section 360bbb-3(b)(1), unless the authorization is terminated or revoked.  Performed at [facility], [address], [place], [place] [address]   Culture, blood (routine x 2)     Status: Abnormal   Collection Time: 10/24/24  2:07 PM   Specimen: BLOOD RIGHT FOREARM  Result Value Ref Range Status   Specimen Description   Final    BLOOD RIGHT  FOREARM BOTTLES DRAWN AEROBIC AND ANAEROBIC Performed at [facility], [address] [place], [place] [address]    Special Requests   Final    Blood Culture results may not be optimal due to an inadequate volume of blood received in culture bottles Performed at [facility], [address] [place], [place] [address]    Culture  Setup Time   Final    IN BOTH AEROBIC AND ANAEROBIC BOTTLES GRAM POSITIVE COCCI IN CLUSTERS Gram Stain Report Called to,Read Back By and Verified With: K. [facility] RN 10/25/24 @0709  BY J. WHITE Performed at [facility], [address], [place], [place] [address]    Culture (A)  Final    STAPHYLOCOCCUS AUREUS SUSCEPTIBILITIES PERFORMED ON PREVIOUS CULTURE WITHIN THE LAST 5 DAYS. Performed at [facility], [address], [place], [place] [address]    Report Status 10/27/2024 FINAL  Final  Culture, blood (routine x 2)     Status: Abnormal   Collection Time: 10/24/24  2:51 PM   Specimen: BLOOD  Result Value Ref Range Status   Specimen Description   Final    BLOOD BLOOD RIGHT ARM Performed at [facility], [address], [place], [place] [address]    Special Requests   Final    BOTTLES DRAWN AEROBIC AND ANAEROBIC Blood Culture adequate volume Performed at [facility], [address], [place], [place] [address]    Culture  Setup Time   Final    IN BOTH AEROBIC AND ANAEROBIC BOTTLES GRAM POSITIVE COCCI IN CLUSTERS Gram Stain Report Called to,Read Back By and Verified With: K. [facility] RN 10/25/24 @0709  BY J. WHITE CRITICAL RESULT CALLED TO, READ BACK BY AND VERIFIED WITH: PHARMD LAURIE POOLE 10/25/24 @1034  PR    Culture (A)  Final    METHICILLIN RESISTANT STAPHYLOCOCCUS AUREUS SEE SEPARATE REPORT Performed at [facility], [address] [place], [place] [address]    Report Status 10/30/2024 FINAL  Final   Organism ID, Bacteria METHICILLIN RESISTANT STAPHYLOCOCCUS AUREUS  Final      Susceptibility   Methicillin resistant staphylococcus aureus  - MIC*    CIPROFLOXACIN  >=8 RESISTANT Resistant     ERYTHROMYCIN >=8 RESISTANT Resistant     GENTAMICIN <=0.5 SENSITIVE Sensitive     OXACILLIN >=4 RESISTANT Resistant     TETRACYCLINE >=16 RESISTANT Resistant     VANCOMYCIN  1 SENSITIVE Sensitive     TRIMETH/SULFA <=10 SENSITIVE Sensitive     CLINDAMYCIN >=  8 RESISTANT Resistant     RIFAMPIN <=0.5 SENSITIVE Sensitive     Inducible Clindamycin NEGATIVE Sensitive     LINEZOLID 2 SENSITIVE Sensitive     * METHICILLIN RESISTANT STAPHYLOCOCCUS AUREUS  Blood Culture ID Panel (Reflexed)     Status: Abnormal   Collection Time: 10/24/24  2:51 PM  Result Value Ref Range Status   Enterococcus faecalis NOT DETECTED NOT DETECTED Final   Enterococcus Faecium NOT DETECTED NOT DETECTED Final   Listeria monocytogenes NOT DETECTED NOT DETECTED Final   Staphylococcus species DETECTED (A) NOT DETECTED Final    Comment: CRITICAL RESULT CALLED TO, READ BACK BY AND VERIFIED WITH: PHARMD LAURIE POOLE 10/25/24 @1034  PR    Staphylococcus aureus (BCID) DETECTED (A) NOT DETECTED Final    Comment: Methicillin (oxacillin)-resistant Staphylococcus aureus (MRSA). MRSA is predictably resistant to beta-lactam antibiotics (except ceftaroline). Preferred therapy is vancomycin  unless clinically contraindicated. Patient requires contact precautions if  hospitalized. CRITICAL RESULT CALLED TO, READ BACK BY AND VERIFIED WITH: PHARMD LAURIE POOLE 10/25/24 @1034  PR    Staphylococcus epidermidis NOT DETECTED NOT DETECTED Final   Staphylococcus lugdunensis NOT DETECTED NOT DETECTED Final   Streptococcus species NOT DETECTED NOT DETECTED Final   Streptococcus agalactiae NOT DETECTED NOT DETECTED Final   Streptococcus pneumoniae NOT DETECTED NOT DETECTED Final   Streptococcus pyogenes NOT DETECTED NOT DETECTED Final   A.calcoaceticus-baumannii NOT DETECTED NOT DETECTED Final   Bacteroides fragilis NOT DETECTED NOT DETECTED Final   Enterobacterales NOT DETECTED NOT DETECTED  Final   Enterobacter cloacae complex NOT DETECTED NOT DETECTED Final   Escherichia coli NOT DETECTED NOT DETECTED Final   Klebsiella aerogenes NOT DETECTED NOT DETECTED Final   Klebsiella oxytoca NOT DETECTED NOT DETECTED Final   Klebsiella pneumoniae NOT DETECTED NOT DETECTED Final   Proteus species NOT DETECTED NOT DETECTED Final   Salmonella species NOT DETECTED NOT DETECTED Final   Serratia marcescens NOT DETECTED NOT DETECTED Final   Haemophilus influenzae NOT DETECTED NOT DETECTED Final   Neisseria meningitidis NOT DETECTED NOT DETECTED Final   Pseudomonas aeruginosa NOT DETECTED NOT DETECTED Final   Stenotrophomonas maltophilia NOT DETECTED NOT DETECTED Final   Candida albicans NOT DETECTED NOT DETECTED Final   Candida auris NOT DETECTED NOT DETECTED Final   Candida glabrata NOT DETECTED NOT DETECTED Final   Candida krusei NOT DETECTED NOT DETECTED Final   Candida parapsilosis NOT DETECTED NOT DETECTED Final   Candida tropicalis NOT DETECTED NOT DETECTED Final   Cryptococcus neoformans/gattii NOT DETECTED NOT DETECTED Final   Meth resistant mecA/C and MREJ DETECTED (A) NOT DETECTED Final    Comment: CRITICAL RESULT CALLED TO, READ BACK BY AND VERIFIED WITH: PHARMD LAURIE POOLE 10/25/24 @1034  PR Performed at [facility], [address], [place], [place] [address]   MIC (1 Drug)-     Status: Abnormal (Preliminary result)   Collection Time: 10/24/24  2:51 PM  Result Value Ref Range Status   Min Inhibitory Conc (1 Drug) Preliminary report (A)  Final    Comment: (NOTE) Performed At: [facility] [address] [place], [place] [id number] Jennette Shorter MD Ey:[id number]    Source PENDING  Incomplete  MIC Result     Status: Abnormal   Collection Time: 10/24/24  2:51 PM  Result Value Ref Range Status   Result 1 (MIC) Comment (A)  Final    Comment: (NOTE) Methicillin - resistant Staphylococcus aureus Identification performed by account, not confirmed by  this laboratory. DAPTOMYCIN Performed At: [facility] [address] [place], [place] [id number] Jennette Shorter MD Ey:[id number]   MRSA Next Gen by PCR, Nasal     Status: Abnormal   Collection Time: 10/24/24  4:33 PM   Specimen: Nasal Mucosa; Nasal Swab  Result Value Ref Range Status   MRSA by PCR Next Gen DETECTED (A) NOT DETECTED Final    Comment: RESULT CALLED TO, READ BACK BY AND VERIFIED WITH: C KINSLEY,RN@1950  1208/25 MK (NOTE) The GeneXpert MRSA Assay (FDA approved for NASAL specimens only), is one component of a comprehensive MRSA colonization surveillance program. It is not intended to diagnose MRSA infection nor to guide or monitor treatment for MRSA infections. Test performance is not FDA approved in patients less than 77 years old. Performed at [facility], [address] [place], [place] [address]   Culture, blood (Routine X 2) w Reflex to ID Panel     Status: None   Collection Time: 10/26/24  7:10 AM   Specimen: BLOOD  Result Value Ref Range Status   Specimen Description BLOOD RT ALINE  Final   Special Requests   Final    BOTTLES DRAWN AEROBIC ONLY Blood Culture results may not be optimal due to an inadequate volume of blood received in culture bottles   Culture   Final    NO GROWTH 5 DAYS Performed at [facility], [address] [place], [place] [address]    Report Status 10/31/2024 FINAL  Final  Culture, blood (Routine X 2) w Reflex to ID Panel     Status: None   Collection Time: 10/26/24  7:10 AM   Specimen: BLOOD  Result Value Ref Range Status   Specimen Description BLOOD BLOOD RIGHT ARM  Final   Special Requests   Final    BOTTLES DRAWN AEROBIC AND ANAEROBIC Blood Culture adequate volume   Culture   Final    NO GROWTH 5 DAYS Performed at [facility], [address], [place], [place] [address]    Report Status 10/31/2024 FINAL  Final  Gram stain     Status: None   Collection Time: 10/26/24  1:05 PM   Specimen: Pleura  Result Value Ref  Range Status   Specimen Description PLEURAL LEFT  Final   Special Requests PLEURAL LEFT  Final   Gram Stain   Final    PLEURAL CYTOSPIN SMEAR NO ORGANISMS SEEN WBC PRESENT,BOTH PMN AND MONONUCLEAR Performed at [facility], [address] [place], [place] [address]    Report Status 10/26/2024 FINAL  Final  Culture, body fluid w Gram Stain-bottle     Status: None   Collection Time: 10/26/24  1:05 PM   Specimen: Pleura  Result Value Ref Range Status   Specimen Description PLEURAL  Final   Special Requests   Final    BOTTLES DRAWN AEROBIC AND ANAEROBIC Blood Culture adequate volume   Culture   Final    NO GROWTH 5 DAYS Performed at [facility], [address] [place], [place] [address]    Report Status 10/31/2024 FINAL  Final  Culture, blood (Routine X 2) w Reflex to ID Panel     Status: None (Preliminary result)   Collection Time: 10/28/24  3:23 PM   Specimen: BLOOD  Result Value Ref Range Status   Specimen Description BLOOD BLOOD RIGHT ARM  Final   Special Requests   Final    BOTTLES DRAWN AEROBIC AND ANAEROBIC Blood Culture adequate volume   Culture   Final    NO GROWTH 3 DAYS Performed at [facility], [address], [place], [place] [address]    Report Status PENDING  Incomplete  Culture, blood (Routine X 2) w Reflex to ID Panel     Status: None (Preliminary result)   Collection Time: 10/28/24  3:23 PM   Specimen: BLOOD  Result Value Ref Range Status   Specimen Description BLOOD RIGHT ANTECUBITAL  Final   Special Requests   Final    BOTTLES DRAWN AEROBIC AND ANAEROBIC Blood Culture adequate volume   Culture   Final    NO GROWTH 3 DAYS Performed at [facility], [address] [place], [place] [address]    Report Status PENDING  Incomplete    Studies/Results: No results found.     Assessment/Plan:  INTERVAL HISTORY:  HD and arterial line are DC'd   Principal Problem:   Multifocal pneumonia Active Problems:   ESRD on dialysis (HCC)   DM2 (diabetes  mellitus, type 2) (HCC)   MRSA bacteremia   History of CVA    Hypotension   Below-knee amputation of right lower extremity (HCC)   CAD (coronary artery disease)   Type 2 diabetes mellitus with diabetic chronic kidney disease (HCC)   Chronic systolic CHF (congestive heart failure) (HCC)   Peripheral vascular disease, unspecified   Severe sepsis with septic shock   Sepsis due to methicillin resistant Staphylococcus aureus (MRSA) (HCC)   Pleural effusion   End stage renal disease (HCC)   Hemodialysis catheter infection    [name] [name] is a 63 y.o. male with  ESRD on HD, DM, now admitted with MRSA bacteremia, multifocal pneumonia and pleural effusion described on CT as loculated, now sp thoracentesis.  #MRSA bacteremia #multifocal pneumonia -- suspect all mrsa bsi related #pleural effusion  10/24/24 bcx mrsa  Original tunneled left internal jugular catheter removed 10/28/24 12/10 and 10/28/24 bcx negative 12/10 L>>right; left sided pleural fluid negative; lights criteria transudative  TTE shows Mitral Valve: There is moderate thickening of the mitral valve leaflet(s).  There is moderate calcification of the mitral valve leaflet(s). Moderate mitral valve regurgitation. No evidence of mitral valve stenosis.   Tricuspid Valve: The tricuspid valve is abnormal. Tricuspid valve  regurgitation is moderate . No evidence of tricuspid stenosis.   Aortic Valve: Severe low flow low gradient aortic stenosis.  There is severe thickening of the aortic valve. There  is severe aortic valve annular calcification.  Aortic valve regurgitation is mild to moderate.  Severe aortic stenosis is present.   Pulmonic Valve: The pulmonic valve was not well visualized. Pulmonic valve  regurgitation is mild.    Given relatively low bp with dialysis, concern for tee and will forgo and treat 6 weeks     --no other focal bone/joint involvement obvious so far --duration of abx dependent on tee (6  weeks for positive and 4 weeks for negative, from 10/28/24) --maintain contact precaution while inpatient --[name] [name] has an appointment on 12/26/2024 at 1045 AM with Dr. Fleeta Rothman  at  [facility] for Infectious Disease, which  is located in the [facility] at  [address] in [place].  [address], which is located to the left of the elevators.  Phone: [phone]  Fax: [phone]  [email or web address]     OPAT Orders Discharge antibiotics to be given via dialysis line Discharge antibiotics: Vancomycin  to be given with dialysis on dialysis days Aim for Vancomycin  trough 15-20 or AUC 400-550 (unless otherwise indicated)  Duration: 6 weeks End Date: 12/09/24  [facility] Care Per Protocol:  Home health RN for IV administration and teaching; PICC line care and labs.    Labs weekly while on IV antibiotics: _x_ CBC with differential __ BMP _x_ CMP __ CRP __ ESR __ Vancomycin  trough __ CK   Fax weekly labs to [phone]    I spent more than 15 minute reviewing data/chart, and coordinating care, with treatment team    LOS: 7 days   Naphtali Zywicki T Mehak Roskelley 10/31/2024, 11:17 AM

## 2024-10-31 NOTE — Procedures (Addendum)
 Procedure Note  10/31/2024    Preoperative Diagnosis: End stage renal disease, MRSA bacteremia, no dialysis access   Postoperative Diagnosis: Same   Procedure(s) Performed: Central Line placement, left femoral trialysis catheter    Surgeon: Manuelita BROCKS. Kallie, MD   Assistants: None   Anesthesia: 1% lidocaine     Complications: None    Indications: Mr. Beem is a 63 y.o. with MRSA bacteremia who had his tunneled left sided catheter removed from his neck for a line holiday. He was scheduled to get a replacement IR placed tunneled line but was hypotensive the AM and could not be transported. 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 His femoral veins were larger and after repeat vein, the left femoral vein was appropriate in size for placement. I discussed the risk and benefits of placement of the central line with him and his daughter Jeoffrey Pounds, including but not limited to bleeding, infection, and injury to vessel, temporary access, and needing the tunneled catheter. They have given consent for the procedure.    Procedure: The patient placed supine. The left groin was prepped and draped in the usual sterile fashion.  Wearing full gown and gloves, I performed the procedure.  One percent lidocaine  was used for local anesthesia. An ultrasound was utilized to assess the left femoral vein which was 4cm deep.  The needle with syringe was advanced into the vein with dark venous return, and a wire was placed using the Seldinger technique without difficulty.  The skin was knicked and a dilator was placed, and the three lumen trialysis catheter was placed over the wire with continued control of the wire.  There was good draw back of blood from all three lumens and each flushed easily with saline.  The catheter was secured in 2 points with 2-0 silk and a biopatch and dressing was placed.   The dialysis ports were packed with heparin .   The patient  tolerated the procedure well. The team was updated.   Manuelita Kallie, MD [facility] [address] [place], [place] [address] [phone] (office)

## 2024-10-31 NOTE — Progress Notes (Signed)
 [facility] at [facility] to transport pt the [facility] for tunn [facility] placement. Pt with SBP in the 80's on levophed . Unable to provide moderate sedation and Radiology nurse do not titrate vasoactive gtts. Case reviewed with Dr. Luverne. Transport canceled. Warren Messing, NP called and made aware at [facility].

## 2024-10-31 NOTE — Progress Notes (Signed)
 [facility] at bedside to transport patient to [facility]. IR unable to accept patient due to low BP readings. Patient is alert and oriented x4 and asymptomatic. Received midodrine  20mg  at 0738. BP reading currently 90/33. Patient is on no vasoactive drips; he has been off the levophed  since Saturday. Dr. Evonnie made aware.

## 2024-10-31 NOTE — Progress Notes (Signed)
 PROGRESS NOTE  [name] [name] FMW:[id number] DOB: 09/15/1961 DOA: 10/24/2024 PCP: [name], MD  Brief History:  63 y.o. male with medical history significant for ESRD on dialysis, atrial fibrillation, systolic CHF, hypertension, diabetes, LBBB.  Patient presented to the ED with complaints of difficulty breathing yesterday about 2 to 3 weeks ago.  He has a bad cough that has been going on for about 15 years, but has worsened from baseline.  Reported some dizziness over the past few days, present to respectable position, and intermittent.  He denies dizziness at this time.  No chest pain, no lower extremity swelling.  Reports he never smoked cigarettes.  His last dialysis was on Friday- 2/5.  He did not get dialyzed today, and has not taken his medications today which include midodrine .  He was in the ED 12/2 with reports of dysuria.  He makes very little urine.  He was started on a course of ciprofloxacin  which he started 12/3, and has been compliant.  He was also started on doxycycline  11/30 for chest infiltrate-thought more likely to be fluid related.   ED Course: Tmax 98.6.  Heart rate 66-91.  Respirate rate 16.  Blood pressure systolic initially 130s now down to 60s on my evaluation.  O2 sats greater 93% on room air.  Lactic acid 2.1.  WBC 12.9.  Sodium 130.  COVID influenza RSV negative.  Troponin 225 >> 216.  CT chest without contrast-multifocal bronchopneumonia, tiny right and moderate partially loculated left pleural effusion.  Collapse/consolidation in left lower lobe may be due to atelectasis, difficult to exclude pneumonia.  Nephrology consulted, no emergent need for dialysis.  IV ceftriaxone  and azithromycin  started.  500ml N/s bolus given.   Assessment/Plan: Severe sepsis with septic shock  - secondary to multifocal pneumonia and MRSA bacteremia - blood cultures positive for MRSA - continue IV vancomycin  - ID consultation noted - weaned off norepi 10/29/24 - continue  midodrine  20 mg TID - pt has soft BPs at baseline   MRSA bacteremia  - 10/24/24 blood culture = MRSA - 10/26/24--blood culture = neg - 10/28/24 blood culture negative - HD cath removed on 10/28/24 - arterial line removed 10/28/24 - 12/11 TTE--EF 20-25%, G2DD, mild decrease RVF; severe AS, no vegetation - line holiday on sat/sun>>temporary HD cath placed 12/15 by Dr. Kallie - plan for 6 weeks of vanc IV>>end date 12/09/24   Left pleural effusion--transudate -10/26/2024--left thoracocentesis>> sent for cell count and cultures -does not appear to be empyema   Chronic hypotension  - he is normally on midodrine  TID - he now presents with sepsis physiology - weaned off norepi on 12/13 - has low BPs at baseline - continue midodrine  20 mg TID   ESRD on HD MWF - inpatient HD 12/15 - consultation from nephrologist   Chronic HFrEF - volume management mostly with HD -- 12/11 TTE--EF 20-25%, G2DD, mild decrease RVF; severe AS, no vegetation - makes small amount of urine - compensated - ESRD and low BPs limit GDMT   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   Objective: Vitals:   10/31/24 1344 10/31/24 1525 10/31/24 1640 10/31/24 1705  BP: 95/68 98/72  (!) 128/99  Pulse: 76 73  79  Resp: 19 (!) 24  16  Temp:   98.4 F (36.9 C)   TempSrc:   Oral   SpO2: 94% 96%  94%  Weight:      Height:         Intake/Output Summary (Last 24 hours) at 10/31/2024 1832 Last data filed at 10/31/2024 0522 Gross per 24 hour  Intake 120 ml  Output 0 ml  Net 120 ml   Weight change:  Exam:  General:  Pt is alert, follows commands appropriately, not in acute distress HEENT: No icterus, No thrush, No neck mass, [place]/AT Cardiovascular: RRR, S1/S2, no rubs, no gallops Respiratory: bibasilar rales. No wheeze Abdomen: Soft/+BS, non tender, non distended, no guarding Extremities: No edema, No lymphangitis, No petechiae, No rashes, no synovitis;  right BKA without erythema or drainage   Data Reviewed: I have personally reviewed following labs and imaging studies Basic Metabolic Panel: Recent Labs  Lab 10/25/24 0442 10/28/24 0749 10/29/24 0343  NA 130* 130* 130*  K 4.1 3.9 3.9  CL 88* 90* 91*  CO2 19* 25 25  GLUCOSE 215* 234* 270*  BUN 63* 59* 38*  CREATININE 11.20* 10.90* 7.71*  CALCIUM  9.2 7.8* 8.3*  MG  --   --  2.4  PHOS 8.0* 5.5*  --    Liver Function Tests: Recent Labs  Lab 10/25/24 0442 10/28/24 0749 10/29/24 0343  AST  --   --  31  ALT  --   --  11  ALKPHOS  --   --  177*  BILITOT  --   --  1.4*  PROT  --   --  7.4  ALBUMIN  3.0* 2.8* 2.8*   No results for input(s): LIPASE, AMYLASE in the last 168 hours. No results for input(s): AMMONIA in the last 168 hours. Coagulation Profile: No results for input(s): INR, PROTIME in the last 168 hours. CBC: Recent Labs  Lab 10/25/24 0442 10/28/24 0749 10/29/24 0343  WBC 16.1* 9.1 10.2  HGB 12.6* 12.0* 12.4*  HCT 37.8* 36.2* 38.0*  MCV 74.6* 73.9* 73.8*  PLT 185 208 219   Cardiac Enzymes: No results for input(s): CKTOTAL, CKMB, CKMBINDEX, TROPONINI in the last 168 hours. BNP: Invalid input(s): POCBNP CBG: Recent Labs  Lab 10/30/24 2119 10/31/24 0240 10/31/24 0733 10/31/24 1121 10/31/24 1647  GLUCAP 195* 180* 153* 122* 163*   HbA1C: No results for input(s): HGBA1C in the last 72 hours. Urine  analysis:    Component Value Date/Time   COLORURINE AMBER (A) 07/01/2022 1143   APPEARANCEUR CLEAR 07/01/2022 1143   LABSPEC 1.017 07/01/2022 1143   PHURINE 7.0 07/01/2022 1143   GLUCOSEU NEGATIVE 07/01/2022 1143   HGBUR NEGATIVE 07/01/2022 1143   BILIRUBINUR NEGATIVE 07/01/2022 1143   KETONESUR NEGATIVE 07/01/2022 1143   PROTEINUR >=300 (A) 07/01/2022 1143   NITRITE NEGATIVE 07/01/2022 1143   LEUKOCYTESUR NEGATIVE 07/01/2022 1143   Sepsis Labs: @LABRCNTIP (procalcitonin:4,lacticidven:4) ) Recent Results (from the past 240 hours)  Resp panel by RT-PCR (RSV, Flu A&B, Covid) Anterior Nasal Swab     Status: None   Collection Time: 10/24/24 10:20 AM  Specimen: Anterior Nasal Swab  Result Value Ref Range Status   SARS Coronavirus 2 by RT PCR NEGATIVE NEGATIVE Final    Comment: (NOTE) SARS-CoV-2 target nucleic acids are NOT DETECTED.  The SARS-CoV-2 RNA is generally detectable in upper respiratory specimens during the acute phase of infection. The lowest concentration of SARS-CoV-2 viral copies this assay can detect is 138 copies/mL. A negative result does not preclude SARS-Cov-2 infection and should not be used as the sole basis for treatment or other patient management decisions. A negative result may occur with  improper specimen collection/handling, submission of specimen other than nasopharyngeal swab, presence of viral mutation(s) within the areas targeted by this assay, and inadequate number of viral copies(<138 copies/mL). A negative result must be combined with clinical observations, patient history, and epidemiological information. The expected result is Negative.  Fact Sheet for Patients:  bloggercourse.com  Fact Sheet for Healthcare Providers:  seriousbroker.it  This test is no t yet approved or cleared by the [place]  FDA and  has been authorized for detection and/or diagnosis of SARS-CoV-2 by FDA under an  Emergency Use Authorization (EUA). This EUA will remain  in effect (meaning this test can be used) for the duration of the COVID-19 declaration under Section 564(b)(1) of the Act, 21 U.S.C.section 360bbb-3(b)(1), unless the authorization is terminated  or revoked sooner.       Influenza A by PCR NEGATIVE NEGATIVE Final   Influenza B by PCR NEGATIVE NEGATIVE Final    Comment: (NOTE) The Xpert Xpress SARS-CoV-2/FLU/RSV plus assay is intended as an aid in the diagnosis of influenza from Nasopharyngeal swab specimens and should not be used as a sole basis for treatment. Nasal washings and aspirates are unacceptable for Xpert Xpress SARS-CoV-2/FLU/RSV testing.  Fact Sheet for Patients: bloggercourse.com  Fact Sheet for Healthcare Providers: seriousbroker.it  This test is not yet approved or cleared by the [place]  FDA and has been authorized for detection and/or diagnosis of SARS-CoV-2 by FDA under an Emergency Use Authorization (EUA). This EUA will remain in effect (meaning this test can be used) for the duration of the COVID-19 declaration under Section 564(b)(1) of the Act, 21 U.S.C. section 360bbb-3(b)(1), unless the authorization is terminated or revoked.     Resp Syncytial Virus by PCR NEGATIVE NEGATIVE Final    Comment: (NOTE) Fact Sheet for Patients: bloggercourse.com  Fact Sheet for Healthcare Providers: seriousbroker.it  This test is not yet approved or cleared by the [place]  FDA and has been authorized for detection and/or diagnosis of SARS-CoV-2 by FDA under an Emergency Use Authorization (EUA). This EUA will remain in effect (meaning this test can be used) for the duration of the COVID-19 declaration under Section 564(b)(1) of the Act, 21 U.S.C. section 360bbb-3(b)(1), unless the authorization is terminated or revoked.  Performed at [facility],  [address], [place], [place] [address]   Culture, blood (routine x 2)     Status: Abnormal   Collection Time: 10/24/24  2:07 PM   Specimen: BLOOD RIGHT FOREARM  Result Value Ref Range Status   Specimen Description   Final    BLOOD RIGHT FOREARM BOTTLES DRAWN AEROBIC AND ANAEROBIC Performed at [facility], [address], [place], [place] [address]    Special Requests   Final    Blood Culture results may not be optimal due to an inadequate volume of blood received in culture bottles Performed at [facility], [address], [place], [place] [address]    Culture  Setup Time  Final    IN BOTH AEROBIC AND ANAEROBIC BOTTLES GRAM POSITIVE COCCI IN CLUSTERS Gram Stain Report Called to,Read Back By and Verified With: K. [facility] RN 10/25/24 @0709  BY J. WHITE Performed at [facility], [address], [place], [place] [address]    Culture (A)  Final    STAPHYLOCOCCUS AUREUS SUSCEPTIBILITIES PERFORMED ON PREVIOUS CULTURE WITHIN THE LAST 5 DAYS. Performed at [facility], [address], [place], [place] [address]    Report Status 10/27/2024 FINAL  Final  Culture, blood (routine x 2)     Status: Abnormal   Collection Time: 10/24/24  2:51 PM   Specimen: BLOOD  Result Value Ref Range Status   Specimen Description   Final    BLOOD BLOOD RIGHT ARM Performed at [facility], [address], [place], [place] [address]    Special Requests   Final    BOTTLES DRAWN AEROBIC AND ANAEROBIC Blood Culture adequate volume Performed at [facility], [address] [place], [place] [address]    Culture  Setup Time   Final    IN BOTH AEROBIC AND ANAEROBIC BOTTLES GRAM POSITIVE COCCI IN CLUSTERS Gram Stain Report Called to,Read Back By and Verified With: K. [facility] RN 10/25/24 @0709  BY J. WHITE CRITICAL RESULT CALLED TO, READ BACK BY AND VERIFIED WITH: PHARMD LAURIE POOLE 10/25/24 @1034  PR    Culture (A)  Final    METHICILLIN RESISTANT STAPHYLOCOCCUS AUREUS SEE SEPARATE REPORT Performed at [facility], [address] [place], [place] [address]    Report Status 10/30/2024 FINAL  Final   Organism ID, Bacteria METHICILLIN RESISTANT STAPHYLOCOCCUS AUREUS  Final      Susceptibility   Methicillin resistant staphylococcus aureus - MIC*    CIPROFLOXACIN  >=8 RESISTANT Resistant     ERYTHROMYCIN >=8 RESISTANT Resistant     GENTAMICIN <=0.5 SENSITIVE Sensitive     OXACILLIN >=4 RESISTANT Resistant     TETRACYCLINE >=16 RESISTANT Resistant     VANCOMYCIN  1 SENSITIVE Sensitive     TRIMETH/SULFA <=10 SENSITIVE Sensitive     CLINDAMYCIN >=8 RESISTANT Resistant     RIFAMPIN <=0.5 SENSITIVE Sensitive     Inducible Clindamycin NEGATIVE Sensitive     LINEZOLID 2 SENSITIVE Sensitive     * METHICILLIN RESISTANT STAPHYLOCOCCUS AUREUS  Blood Culture ID Panel (Reflexed)     Status: Abnormal   Collection Time: 10/24/24  2:51 PM  Result Value Ref Range Status   Enterococcus faecalis NOT DETECTED NOT DETECTED Final   Enterococcus Faecium NOT DETECTED NOT DETECTED Final   Listeria monocytogenes NOT DETECTED NOT DETECTED Final   Staphylococcus species DETECTED (A) NOT DETECTED Final    Comment: CRITICAL RESULT CALLED TO, READ BACK BY AND VERIFIED WITH: PHARMD LAURIE POOLE 10/25/24 @1034  PR    Staphylococcus aureus (BCID) DETECTED (A) NOT DETECTED Final    Comment: Methicillin (oxacillin)-resistant Staphylococcus aureus (MRSA). MRSA is predictably resistant to beta-lactam antibiotics (except ceftaroline). Preferred therapy is vancomycin  unless clinically contraindicated. Patient requires contact precautions if  hospitalized. CRITICAL RESULT CALLED TO, READ BACK BY AND VERIFIED WITH: PHARMD LAURIE POOLE 10/25/24 @1034  PR    Staphylococcus epidermidis NOT DETECTED NOT DETECTED Final   Staphylococcus lugdunensis NOT DETECTED NOT DETECTED Final   Streptococcus species NOT DETECTED NOT DETECTED Final   Streptococcus agalactiae NOT DETECTED NOT DETECTED Final   Streptococcus pneumoniae NOT  DETECTED NOT DETECTED Final   Streptococcus pyogenes NOT DETECTED NOT DETECTED Final   A.calcoaceticus-baumannii NOT DETECTED NOT DETECTED Final  Bacteroides fragilis NOT DETECTED NOT DETECTED Final   Enterobacterales NOT DETECTED NOT DETECTED Final   Enterobacter cloacae complex NOT DETECTED NOT DETECTED Final   Escherichia coli NOT DETECTED NOT DETECTED Final   Klebsiella aerogenes NOT DETECTED NOT DETECTED Final   Klebsiella oxytoca NOT DETECTED NOT DETECTED Final   Klebsiella pneumoniae NOT DETECTED NOT DETECTED Final   Proteus species NOT DETECTED NOT DETECTED Final   Salmonella species NOT DETECTED NOT DETECTED Final   Serratia marcescens NOT DETECTED NOT DETECTED Final   Haemophilus influenzae NOT DETECTED NOT DETECTED Final   Neisseria meningitidis NOT DETECTED NOT DETECTED Final   Pseudomonas aeruginosa NOT DETECTED NOT DETECTED Final   Stenotrophomonas maltophilia NOT DETECTED NOT DETECTED Final   Candida albicans NOT DETECTED NOT DETECTED Final   Candida auris NOT DETECTED NOT DETECTED Final   Candida glabrata NOT DETECTED NOT DETECTED Final   Candida krusei NOT DETECTED NOT DETECTED Final   Candida parapsilosis NOT DETECTED NOT DETECTED Final   Candida tropicalis NOT DETECTED NOT DETECTED Final   Cryptococcus neoformans/gattii NOT DETECTED NOT DETECTED Final   Meth resistant mecA/C and MREJ DETECTED (A) NOT DETECTED Final    Comment: CRITICAL RESULT CALLED TO, READ BACK BY AND VERIFIED WITH: PHARMD LAURIE POOLE 10/25/24 @1034  PR Performed at [facility], [address], [place], [place] [address]   MIC (1 Drug)-     Status: Abnormal   Collection Time: 10/24/24  2:51 PM  Result Value Ref Range Status   Min Inhibitory Conc (1 Drug) Final report (A)  Corrected    Comment: (NOTE) Performed At: [facility] [address] [place], [place] [id number] Jennette Shorter MD Ey:[id number] CORRECTED ON 12/15 AT 1735: PREVIOUSLY REPORTED AS Preliminary report     Source PENDING  Incomplete  MIC Result     Status: Abnormal   Collection Time: 10/24/24  2:51 PM  Result Value Ref Range Status   Result 1 (MIC) Comment (A)  Final    Comment: (NOTE) Methicillin - resistant Staphylococcus aureus Identification performed by account, not confirmed by this laboratory. Testing performed by broth microdilution. DAPTOMYCIN =0.5ug/ml SUSCEPTIBLE Performed At: [facility] [address] [place], [place] [id number] Jennette Shorter MD Ey:[id number]   MRSA Next Gen by PCR, Nasal     Status: Abnormal   Collection Time: 10/24/24  4:33 PM   Specimen: Nasal Mucosa; Nasal Swab  Result Value Ref Range Status   MRSA by PCR Next Gen DETECTED (A) NOT DETECTED Final    Comment: RESULT CALLED TO, READ BACK BY AND VERIFIED WITH: C KINSLEY,RN@1950  1208/25 MK (NOTE) The GeneXpert MRSA Assay (FDA approved for NASAL specimens only), is one component of a comprehensive MRSA colonization surveillance program. It is not intended to diagnose MRSA infection nor to guide or monitor treatment for MRSA infections. Test performance is not FDA approved in patients less than 105 years old. Performed at [facility], [address], [place], [place] [address]   Culture, blood (Routine X 2) w Reflex to ID Panel     Status: None   Collection Time: 10/26/24  7:10 AM   Specimen: BLOOD  Result Value Ref Range Status   Specimen Description BLOOD RT ALINE  Final   Special Requests   Final    BOTTLES DRAWN AEROBIC ONLY Blood Culture results may not be optimal due to an inadequate volume of blood received in culture bottles   Culture   Final    NO GROWTH 5 DAYS Performed at [facility], [address], [place], [place] [address]    Report Status 10/31/2024 FINAL  Final  Culture, blood (Routine X 2) w Reflex to ID Panel     Status: None   Collection Time: 10/26/24  7:10 AM   Specimen: BLOOD  Result Value Ref Range Status   Specimen Description BLOOD BLOOD RIGHT ARM  Final    Special Requests   Final    BOTTLES DRAWN AEROBIC AND ANAEROBIC Blood Culture adequate volume   Culture   Final    NO GROWTH 5 DAYS Performed at [facility], [address], [place], [place] [address]    Report Status 10/31/2024 FINAL  Final  Gram stain     Status: None   Collection Time: 10/26/24  1:05 PM   Specimen: Pleura  Result Value Ref Range Status   Specimen Description PLEURAL LEFT  Final   Special Requests PLEURAL LEFT  Final   Gram Stain   Final    PLEURAL CYTOSPIN SMEAR NO ORGANISMS SEEN WBC PRESENT,BOTH PMN AND MONONUCLEAR Performed at [facility], [address], [place], [place] [address]    Report Status 10/26/2024 FINAL  Final  Culture, body fluid w Gram Stain-bottle     Status: None   Collection Time: 10/26/24  1:05 PM   Specimen: Pleura  Result Value Ref Range Status   Specimen Description PLEURAL  Final   Special Requests   Final    BOTTLES DRAWN AEROBIC AND ANAEROBIC Blood Culture adequate volume   Culture   Final    NO GROWTH 5 DAYS Performed at [facility], [address] [place], [place] [address]    Report Status 10/31/2024 FINAL  Final  Culture, blood (Routine X 2) w Reflex to ID Panel     Status: None (Preliminary result)   Collection Time: 10/28/24  3:23 PM   Specimen: BLOOD  Result Value Ref Range Status   Specimen Description BLOOD BLOOD RIGHT ARM  Final   Special Requests   Final    BOTTLES DRAWN AEROBIC AND ANAEROBIC Blood Culture adequate volume   Culture   Final    NO GROWTH 3 DAYS Performed at [facility], [address] [place], [place] [address]    Report Status PENDING  Incomplete  Culture, blood (Routine X 2) w Reflex to ID Panel     Status: None (Preliminary result)   Collection Time: 10/28/24  3:23 PM   Specimen: BLOOD  Result Value Ref Range Status   Specimen Description BLOOD RIGHT ANTECUBITAL  Final   Special Requests   Final    BOTTLES DRAWN AEROBIC AND ANAEROBIC Blood Culture adequate volume   Culture   Final    NO  GROWTH 3 DAYS Performed at [facility], [address] [place], [place] [address]    Report Status PENDING  Incomplete     Scheduled Meds:  aspirin  EC  81 mg Oral Q breakfast   Chlorhexidine  Gluconate Cloth  6 each Topical Q0600   cinacalcet   60 mg Oral Q breakfast   gabapentin   300 mg Oral Daily   heparin   5,000 Units Subcutaneous Q8H   insulin  aspart  0-5 Units Subcutaneous QHS   insulin  aspart  0-9 Units Subcutaneous TID WC   midodrine   20 mg Oral TID WC   sevelamer  carbonate  4,000 mg Oral TID WC   vancomycin  variable dose per unstable renal function (pharmacist dosing)   Does not apply See admin instructions   Continuous Infusions:  anticoagulant sodium citrate   norepinephrine  (LEVOPHED ) Adult infusion Stopped (10/29/24 1233)   vancomycin  Stopped (10/28/24 1459)    Procedures/Studies: ECHOCARDIOGRAM COMPLETE Result Date: 10/28/2024    ECHOCARDIOGRAM REPORT   Patient Name:   COPELAND NEISEN Sr. Date of Exam: 10/27/2024 Medical Rec #:  [id number]         Height:       73.0 in Accession #:    [id number]        Weight:       230.2 lb Date of Birth:  Aug 01, 1961        BSA:          2.284 m Patient Age:    63 years          BP:           96/50 mmHg Patient Gender: M                 HR:           62 bpm. Exam Location:  [facility] Procedure: 2D Echo (Both Spectral and Color Flow Doppler were utilized during            procedure). Indications:    Bacteremia R78.81  History:        Patient has prior history of Echocardiogram examinations, most                 recent 11/09/2023. Cardiomyopathy and CHF, CAD and Previous                 Myocardial Infarction, Arrythmias:Atrial Flutter and LBBB; Risk                 Factors:Diabetes and Hypertension. ESRD on dialysis [facility]).  Sonographer:    Aida Pizza RCS Referring Phys: [id number] CORNELIUS N VAN DAM  Sonographer Comments: Global longitudinal strain was attempted. IMPRESSIONS  1. Left ventricular ejection fraction, by estimation, is 20 to 25%. The  left ventricle has severely decreased function. The left ventricle demonstrates global hypokinesis. There is mild left ventricular hypertrophy. Left ventricular diastolic parameters  are consistent with Grade II diastolic dysfunction (pseudonormalization). Elevated left atrial pressure.  2. Right ventricular systolic function is mildly reduced. The right ventricular size is moderately enlarged. There is moderately elevated pulmonary artery systolic pressure.  3. The mitral valve is abnormal. Moderate mitral valve regurgitation. No evidence of mitral stenosis. Moderate mitral annular calcification.  4. The tricuspid valve is abnormal. Tricuspid valve regurgitation is moderate.  5. Severe low flow low gradient aortic stenosis. The aortic valve has an indeterminant number of cusps. There is severe calcifcation of the aortic valve. There is severe thickening of the aortic valve. Aortic valve regurgitation is mild to moderate. Severe aortic valve stenosis. Aortic valve area, by VTI measures 0.77 cm. Aortic valve mean gradient measures 24.0 mmHg. Aortic valve Vmax measures 3.20 m/s.  6. The inferior vena cava is dilated in size with <50% respiratory variability, suggesting right atrial pressure of 15 mmHg. FINDINGS  Left Ventricle: Left ventricular ejection fraction, by estimation, is 20 to 25%. The left ventricle has severely decreased function. The left ventricle demonstrates global hypokinesis. Strain was performed and the global longitudinal strain is indeterminate. The left ventricular internal cavity size was normal in size. There is mild left ventricular hypertrophy. Left ventricular diastolic parameters are consistent with Grade II diastolic dysfunction (pseudonormalization). Elevated left atrial pressure. Right Ventricle: The right ventricular size is moderately enlarged. Right vetricular wall thickness was not well visualized. Right ventricular systolic function is mildly  reduced. There is moderately elevated  pulmonary artery systolic pressure. The tricuspid regurgitant velocity is 3.11 m/s, and with an assumed right atrial pressure of 15 mmHg, the estimated right ventricular systolic pressure is 53.7 mmHg. Left Atrium: Left atrial size was normal in size. Right Atrium: Right atrial size was normal in size. Pericardium: There is no evidence of pericardial effusion. Mitral Valve: The MR vena contracta is 0.4 cm. The mitral valve is abnormal. There is moderate thickening of the mitral valve leaflet(s). There is moderate calcification of the mitral valve leaflet(s). Moderate mitral annular calcification. Moderate mitral valve regurgitation. No evidence of mitral valve stenosis. Tricuspid Valve: The tricuspid valve is abnormal. Tricuspid valve regurgitation is moderate . No evidence of tricuspid stenosis. Aortic Valve: Severe low flow low gradient aortic stenosis. The aortic valve has an indeterminant number of cusps. There is severe calcifcation of the aortic valve. There is severe thickening of the aortic valve. There is severe aortic valve annular calcification. Aortic valve regurgitation is mild to moderate. Aortic regurgitation PHT measures 435 msec. Severe aortic stenosis is present. Aortic valve mean gradient measures 24.0 mmHg. Aortic valve peak gradient measures 41.0 mmHg. Aortic valve area,  by VTI measures 0.77 cm. Pulmonic Valve: The pulmonic valve was not well visualized. Pulmonic valve regurgitation is mild. No evidence of pulmonic stenosis. Aorta: The aortic root is normal in size and structure. Venous: The inferior vena cava is dilated in size with less than 50% respiratory variability, suggesting right atrial pressure of 15 mmHg. IAS/Shunts: No atrial level shunt detected by color flow Doppler. Additional Comments: 3D was performed not requiring image post processing on an independent workstation and was abnormal.  LEFT VENTRICLE PLAX 2D LVIDd:         5.75 cm LVIDs:         5.30 cm LV PW:         1.10 cm  LV IVS:        1.10 cm LVOT diam:     2.00 cm   3D Volume EF: LV SV:         49        3D EF:        38 % LV SV Index:   21        LV EDV:       217 ml LVOT Area:     3.14 cm  LV ESV:       134 ml                          LV SV:        83 ml RIGHT VENTRICLE TAPSE (M-mode): 1.4 cm LEFT ATRIUM             Index        RIGHT ATRIUM           Index LA diam:        4.90 cm 2.15 cm/m   RA Area:     18.70 cm LA Vol (A2C):   92.0 ml 40.28 ml/m  RA Volume:   56.00 ml  24.52 ml/m LA Vol (A4C):   70.6 ml 30.91 ml/m LA Biplane Vol: 81.3 ml 35.60 ml/m  AORTIC VALVE AV Area (Vmax):    0.69 cm AV Area (Vmean):   0.64 cm AV Area (VTI):     0.77 cm AV Vmax:           320.00 cm/s AV Vmean:  232.000 cm/s AV VTI:            0.635 m AV Peak Grad:      41.0 mmHg AV Mean Grad:      24.0 mmHg LVOT Vmax:         70.30 cm/s LVOT Vmean:        47.600 cm/s LVOT VTI:          0.155 m LVOT/AV VTI ratio: 0.24 AI PHT:            435 msec  AORTA Ao Root diam: 3.70 cm MITRAL VALVE                  TRICUSPID VALVE MV Area (PHT): 4.60 cm       TR Peak grad:   38.7 mmHg MV Decel Time: 165 msec       TR Vmax:        311.00 cm/s MR Peak grad:    108.2 mmHg MR Mean grad:    64.0 mmHg    SHUNTS MR Vmax:         520.00 cm/s  Systemic VTI:  0.16 m MR Vmean:        370.0 cm/s   Systemic Diam: 2.00 cm MR PISA:         1.90 cm MR PISA Eff ROA: 11 mm MR PISA Radius:  0.55 cm MV E velocity: 145.00 cm/s MV A velocity: 70.20 cm/s MV E/A ratio:  2.07 Dorn Ross MD Electronically signed by Dorn Ross MD Signature Date/Time: 10/28/2024/2:06:38 PM    Final    US  THORACENTESIS ASP PLEURAL SPACE W/IMG GUIDE Result Date: 10/26/2024 INDICATION: Patient admitted 10/24/2024 with severe sepsis, MRSA bacteremia and new left pleural effusion. Request for diagnostic and therapeutic left thoracentesis. EXAM: ULTRASOUND GUIDED LEFT THORACENTESIS MEDICATIONS: 6 mL 1% lidocaine  COMPLICATIONS: None immediate. PROCEDURE: An ultrasound guided  thoracentesis was thoroughly discussed with the patient and questions answered. The benefits, risks, alternatives and complications were also discussed. The patient understands and wishes to proceed with the procedure. Written consent was obtained. Ultrasound was performed to localize and mark an adequate pocket of fluid in the left chest. The area was then prepped and draped in the normal sterile fashion. 1% Lidocaine  was used for local anesthesia. Under ultrasound guidance a 19 gauge, 7-cm, Yueh catheter was introduced. Thoracentesis was performed. The catheter was removed and a dressing applied. FINDINGS: A total of approximately 450 mL of clear yellow fluid was removed. Samples were sent to the laboratory as requested by the clinical team. IMPRESSION: Successful ultrasound guided left thoracentesis yielding 450 mL of pleural fluid. Performed by Clotilda Hesselbach, PA-C Electronically Signed   By: Ester Sides M.D.   On: 10/26/2024 16:13   DG Chest 1 View Result Date: 10/26/2024 EXAM: 1 VIEW(S) XRAY OF THE CHEST 10/26/2024 01:55:07 PM COMPARISON: 2 days ago. CLINICAL HISTORY: [id number] Status post thoracentesis [id number] Status post thoracentesis. FINDINGS: LINES, TUBES AND DEVICES: Stable left internal jugular dialysis catheter. LUNGS AND PLEURA: New right upper lobe opacity is noted concerning for possible infiltrate. Stable left basilar atelectasis and possible small pleural effusion is noted. No pneumothorax is noted. HEART AND MEDIASTINUM: Stable cardiomegaly. BONES AND SOFT TISSUES: No acute osseous abnormality. IMPRESSION: 1. New right upper lobe opacity, concerning for possible pneumonia. 2. Stable left basilar atelectasis with possible small pleural effusion. Electronically signed by: [name] Seip MD 10/26/2024 02:38 PM EST RP Workstation: [id number]   CT Chest Wo Contrast Result Date: 10/24/2024 CLINICAL DATA:  Pneumonia. Shortness of breath after dialysis Friday. EXAM: CT CHEST WITHOUT CONTRAST  TECHNIQUE: Multidetector CT imaging of the chest was performed following the standard protocol without IV contrast. RADIATION DOSE REDUCTION: This exam was performed according to the departmental dose-optimization program which includes automated exposure control, adjustment of the mA and/or kV according to patient size and/or use of iterative reconstruction technique. COMPARISON:  11/08/2023. FINDINGS: Cardiovascular: Atherosclerotic calcification of the aorta, aortic valve and coronary arteries. Enlarged pulmonic trunk and heart. No pericardial effusion. Left IJ dialysis catheter terminates in the right atrium. Mediastinum/Nodes: No pathologically enlarged mediastinal or axillary lymph nodes. Hilar regions are difficult to definitively evaluate without IV contrast. Esophagus is grossly unremarkable. Lungs/Pleura: Image quality is degraded by expiratory phase imaging and respiratory motion. Peribronchovascular ground-glass and consolidation in the anterior segment right upper lobe and likely elsewhere within the right middle and right lower lobes. Tiny right and moderate left pleural effusion, the latter of which is partially loculated. Compressive collapse/consolidation in the left lower lobe and to a lesser extent, lingula. There may be tracheobronchomalacia. Upper Abdomen: Liver margin is slightly irregular. Bilateral renal stones and/or renal vascular calcifications. Visualized portions of the liver, gallbladder, adrenal glands, kidneys, spleen, pancreas, stomach and bowel are otherwise grossly unremarkable. No upper abdominal adenopathy. Slight nonspecific haziness in the left upper quadrant omentum. Musculoskeletal: Degenerative changes in the spine. Old sternal fracture. IMPRESSION: 1. Multilobar bronchopneumonia. 2. Tiny right and moderate partially loculated left pleural effusions. Collapse/consolidation in the left lower lobe may be due to atelectasis. Difficult to exclude pneumonia. 3. 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 4. Liver margin is slightly irregular, raising suspicion for cirrhosis. 5. Bilateral renal stones and/or renal vascular calcifications. 6. Aortic atherosclerosis (ICD10-I70.0). Coronary artery calcification. 7. Enlarged pulmonic trunk, indicative of pulmonary arterial hypertension. Electronically Signed   By: Newell Eke M.D.   On: 10/24/2024 14:15   DG Chest Port 1 View Result Date: 10/24/2024 CLINICAL DATA:  Shortness of breath. EXAM: PORTABLE CHEST 1 VIEW COMPARISON:  10/16/2024 FINDINGS: Low volume film. The cardio pericardial silhouette is enlarged. Retrocardiac collapse/consolidation with effusion is similar to prior. No pulmonary edema. Left IJ central line tip overlies the mid SVC level. IMPRESSION: Low volume film with persistent retrocardiac collapse/consolidation and effusion. Electronically Signed   By: Camellia Candle M.D.   On: 10/24/2024 11:31   DG Chest Port 1 View Result Date: 10/16/2024 EXAM: 1 VIEW(S) XRAY OF THE CHEST 10/16/2024 12:11:00 PM COMPARISON: 01/13/2024 CLINICAL HISTORY: sob FINDINGS: LINES, TUBES AND DEVICES: Tunneled left IJ hemodialysis catheter with tip at superior cavoatrial junction. LUNGS AND PLEURA: Low lung volumes. New left retrocardiac airspace opacity. Possible small left pleural effusion. No pneumothorax. HEART AND MEDIASTINUM: The cardiac silhouette is unremarkable. Aortic atherosclerosis is present. BONES AND SOFT TISSUES: No acute osseous abnormality. IMPRESSION: 1. New left retrocardiac airspace opacity and possible small left pleural effusion. 2. Low lung volumes. Electronically signed by: Evalene Coho MD 10/16/2024 12:16 PM EST RP Workstation: [email or web address] Alm Schneider, DO  [facility]  If 7PM-7AM, please contact night-coverage [email or web address] Password [email or web address] 10/31/2024, 6:32 PM   LOS: 7 days

## 2024-10-31 NOTE — Progress Notes (Signed)
 PHARMACY CONSULT NOTE FOR:  OUTPATIENT  PARENTERAL ANTIBIOTIC THERAPY with Hemodialysis  Indication: MRSA bacteremia Regimen: Vancomycin  1000 mg every HD MWF  End date: 12/09/24  No formal OPAT will be done as patient will receive antibiotics with HD. Renal coordinator to be informed by Dr. Evonnie. Informational order will be placed.   Thank you for allowing pharmacy to be a part of this patient's care.  Damien Quiet, PharmD, BCPS, BCIDP Infectious Diseases Clinical Pharmacist Phone: [phone] 10/31/2024, 11:49 AM

## 2024-11-01 LAB — GLUCOSE, CAPILLARY
Glucose-Capillary: 259 mg/dL — ABNORMAL HIGH (ref 70–99)
Glucose-Capillary: 236 mg/dL — ABNORMAL HIGH (ref 70–99)
Glucose-Capillary: 273 mg/dL — ABNORMAL HIGH (ref 70–99)
Glucose-Capillary: 161 mg/dL — ABNORMAL HIGH (ref 70–99)
Glucose-Capillary: 269 mg/dL — ABNORMAL HIGH (ref 70–99)

## 2024-11-01 LAB — MINIMUM INHIBITORY CONC. (1 DRUG)

## 2024-11-01 MED ORDER — SODIUM CHLORIDE 0.9 % IV SOLN: INTRAVENOUS | Status: AC | PRN

## 2024-11-01 NOTE — Inpatient Diabetes Management (Signed)
 Inpatient Diabetes Program Recommendations  AACE/ADA: New Consensus Statement on Inpatient Glycemic Control (2015)  Target Ranges:  Prepandial:   less than 140 mg/dL      Peak postprandial:   less than 180 mg/dL (1-2 hours)      Critically ill patients:  140 - 180 mg/dL   Lab Results  Component Value Date   GLUCAP 273 (H) 11/01/2024   HGBA1C 8.6 (H) 10/24/2024    Review of Glycemic Control  Diabetes history: DM2 Outpatient Diabetes medications: Novolin  70/30 30 in am and 25 QPM Current orders for Inpatient glycemic control: Novolog  0-9 TID with meals and 0-5 HS  HgbA1C - 8.6% CBGs 236, 273 today   Inpatient Diabetes Program Recommendations:    Consider adding Semglee  10 units at bedtime  Continue to follow.  Thank you. Shona Brandy, RD, LDN, CDCES Inpatient Diabetes Coordinator [phone]

## 2024-11-01 NOTE — Progress Notes (Signed)
 Arterial line attempted x3. Unsuccessful. Will continue cuff pressures and titrate NE to parameters. Patient remains A/Ox4 and intermittently drowsy.

## 2024-11-01 NOTE — Progress Notes (Signed)
 Arterial line attempted x2 with no success.

## 2024-11-01 NOTE — Progress Notes (Signed)
 Patient ID: [name] [name], male   DOB: [birth year], 63 y.o.   MRN: [id number] S: had temp left femoral HD catheter placed 10/31/24 by Dr. Kallie and tolerated HD with 2 litersUF O:BP (!) 86/52   Pulse 81   Temp (!) 100.6 F (38.1 C) (Oral)   Resp (!) 27   Ht 6' 1 (1.854 m)   Wt 105.3 kg   SpO2 92%   BMI 30.63 kg/m   Intake/Output Summary (Last 24 hours) at 11/01/2024 1116 Last data filed at 11/01/2024 0800 Gross per 24 hour  Intake 360 ml  Output 2000 ml  Net -1640 ml   Intake/Output: I/O last 3 completed shifts: In: 240 [P.O.:240] Out: 2000 [Other:2000]  Intake/Output this shift:  Total I/O In: 240 [P.O.:240] Out: -  Weight change:  Gen: NAD CVS: RRR Resp:occ rhonchi Abd: +BS, soft, NT/ND Ext: s/p RBKA, trace pretibial edema of LLE  Recent Labs  Lab 10/28/24 0749 10/29/24 0343  NA 130* 130*  K 3.9 3.9  CL 90* 91*  CO2 25 25  GLUCOSE 234* 270*  BUN 59* 38*  CREATININE 10.90* 7.71*  ALBUMIN  2.8* 2.8*  CALCIUM  7.8* 8.3*  PHOS 5.5*  --   AST  --  31  ALT  --  11   Liver Function Tests: Recent Labs  Lab 10/28/24 0749 10/29/24 0343  AST  --  31  ALT  --  11  ALKPHOS  --  177*  BILITOT  --  1.4*  PROT  --  7.4  ALBUMIN  2.8* 2.8*   No results for input(s): LIPASE, AMYLASE in the last 168 hours. No results for input(s): AMMONIA in the last 168 hours. CBC: Recent Labs  Lab 10/28/24 0749 10/29/24 0343  WBC 9.1 10.2  HGB 12.0* 12.4*  HCT 36.2* 38.0*  MCV 73.9* 73.8*  PLT 208 219   Cardiac Enzymes: No results for input(s): CKTOTAL, CKMB, CKMBINDEX, TROPONINI in the last 168 hours. CBG: Recent Labs  Lab 10/31/24 1121 10/31/24 1647 10/31/24 2142 11/01/24 0332 11/01/24 0735  GLUCAP 122* 163* 141* 259* 236*    Iron  Studies: No results for input(s): IRON , TIBC, TRANSFERRIN, FERRITIN in the last 72 hours. Studies/Results: No results found.  aspirin  EC  81 mg Oral Q breakfast   Chlorhexidine  Gluconate Cloth  6 each  Topical Q0600   cinacalcet   60 mg Oral Q breakfast   gabapentin   300 mg Oral Daily   heparin   5,000 Units Subcutaneous Q8H   insulin  aspart  0-5 Units Subcutaneous QHS   insulin  aspart  0-9 Units Subcutaneous TID WC   midodrine   20 mg Oral TID WC   sevelamer  carbonate  4,000 mg Oral TID WC    BMET    Component Value Date/Time   NA 130 (L) 10/29/2024 0343   K 3.9 10/29/2024 0343   CL 91 (L) 10/29/2024 0343   CO2 25 10/29/2024 0343   GLUCOSE 270 (H) 10/29/2024 0343   BUN 38 (H) 10/29/2024 0343   CREATININE 7.71 (H) 10/29/2024 0343   CALCIUM  8.3 (L) 10/29/2024 0343   GFRNONAA 7 (L) 10/29/2024 0343   GFRAA 3 (L) 08/14/2019 2051   CBC    Component Value Date/Time   WBC 10.2 10/29/2024 0343   RBC 5.15 10/29/2024 0343   HGB 12.4 (L) 10/29/2024 0343   HGB 13.0 12/17/2022 1242   HCT 38.0 (L) 10/29/2024 0343   PLT 219 10/29/2024 0343   PLT 272 12/17/2022 1242   MCV 73.8 (L) 10/29/2024  0343   MCH 24.1 (L) 10/29/2024 0343   MCHC 32.6 10/29/2024 0343   RDW 26.1 (H) 10/29/2024 0343   LYMPHSABS 0.9 10/24/2024 1014   MONOABS 1.6 (H) 10/24/2024 1014   EOSABS 0.4 10/24/2024 1014   BASOSABS 0.1 10/24/2024 1014    Dialysis prescription: [facility], MWF, 180 dialyzer, 4 hours 15 minutes, BFR 400/DFR 800, EDW 101.9 kg, 2K/2.0 calcium , UF profile #4, heparin  6000 unit bolus.  Left IJ TDC.  Hectorol  5 mcg IV q. HD.   On Sensipar  60 mg daily, midodrine  20 mg 3 times daily with option to take extra 20 mg mid to dialysis, Renvela  4000 mg 3 times daily AC.   Assessment/Plan: 1.  Multifocal pneumonia: Negative testing for RSV infection and COVID and started on presumptive treatment for pneumonia based on CT scan findings; on vancomycin  and cefepime  -> Vanc.  Concern raised with 4 out of 4 positive blood cultures with gram-positive cocci.  Awaiting identification/sensitivities; on Vanc, ID onboard as well.   2.  End-stage renal disease: Typically on MWF dialysis schedule and  dialysis postponed to Tues (1L net UF). Attempting to wean down Levophed ; his typical dose of midodrine  20 mg 3 times daily.  He does not appear to be hypervolemic on exam or upon review of his imaging but is 2kg up on EDW.     Per ID recs needs at least 48hrs line free, currently still has IV's on right, art line, LIJ TC. Appreciate Dr. Evonnie removed the LIJ TC on 10/28/24.  Had temporary left femoral HD catheter placed by Dr. Kallie on 10/31/24. See below.    Discussed with the dialysis nurse and she will come in a little bit early Friday and has the patient on for first shift.   3.  Hypotension: Acute on chronic with evidence of sepsis in the setting of infection/bacteremia.  Resume oral midodrine  dose 20 mg 3 times a day and weaned off of Levophed  drip. Totally asymptomatic.     4.  Anemia: Hemoglobin and hematocrit currently at acceptable range, no indication for ESA.   5.  Secondary hyperparathyroidism: Calcium  level acceptable with significant hyperphosphatemia-resume sevelamer  for phosphorus binding and Sensipar  for PTH control.  Hold Hectorol  for now.   6. Vascular access - had LIJ TDC removed last week for line holiday.  Will need new TDC placement.  IR refused transport to [facility] due to low bp.  Dr. Evonnie discussed case with me and Dr. Kallie who has kindly agreed to place temp left femoral HD catheter.  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 Hopefully can get TDC placed by IR this week.    Fairy RONAL Sellar, MD [facility]

## 2024-11-01 NOTE — Progress Notes (Signed)
 Contacted about possible TEE for bacteremia. Continue to hold at this time due to low blood pressures, patient admitted with septic shock. Systolics 60 to 80 early this morning. Continue to hold off on TEE for now  Dorn Ross MD

## 2024-11-01 NOTE — Progress Notes (Addendum)
 Responded to nursing call:  continued intermittent hypotension and increase sleepiness   Subjective: Pt is more sleepy but wakes up to voice and is A&O x 3  Vitals:   11/01/24 1807 11/01/24 1808 11/01/24 1827 11/01/24 1828  BP: (!) 106/38   (!) 86/44  Pulse: 71 71 71 71  Resp: (!) 22 19 (!) 21 (!) 24  Temp:      TempSrc:      SpO2: 95% 94% 96% 95%  Weight:      Height:       CV--RRR Lung--bibasilar crackles Abd--soft+BS/NT   Assessment/Plan:  Hypotension --has been chronic --on chronic midodrine  --not unusual to have SBP in mid 80s --cuff BPs have been somewhat question reliability --since patient is more sleep than last few days>>restart low dose levophed  --ask respiratory to place arterial line again for BP monitoring --hold transfer to [facility] for right now pending response to levophed  and/or continued need for levophed  which may require ICU transfer   The patient is critically ill with multiple organ systems failure and requires high complexity decision making for assessment and support, frequent evaluation and titration of therapies, application of advanced monitoring technologies and extensive interpretation of multiple databases.  Critical care time - 35 mins.     Alm Schneider, DO [facility]

## 2024-11-01 NOTE — Plan of Care (Signed)
 Problem: Education: Goal: Knowledge of General Education information will improve Description: Including pain rating scale, medication(s)/side effects and non-pharmacologic comfort measures Outcome: Progressing   Problem: Health Behavior/Discharge Planning: Goal: Ability to manage health-related needs will improve Outcome: Progressing   Problem: Clinical Measurements: Goal: Ability to maintain clinical measurements within normal limits will improve Outcome: Progressing Goal: Will remain free from infection Outcome: Progressing Goal: Diagnostic test results will improve Outcome: Progressing Goal: Respiratory complications will improve Outcome: Progressing Goal: Cardiovascular complication will be avoided Outcome: Progressing   Problem: Activity: Goal: Risk for activity intolerance will decrease Outcome: Progressing   Problem: Nutrition: Goal: Adequate nutrition will be maintained Outcome: Progressing   Problem: Coping: Goal: Level of anxiety will decrease Outcome: Progressing   Problem: Elimination: Goal: Will not experience complications related to bowel motility Outcome: Progressing Goal: Will not experience complications related to urinary retention Outcome: Progressing   Problem: Pain Managment: Goal: General experience of comfort will improve and/or be controlled Outcome: Progressing   Problem: Safety: Goal: Ability to remain free from injury will improve Outcome: Progressing   Problem: Skin Integrity: Goal: Risk for impaired skin integrity will decrease Outcome: Progressing   Problem: Activity: Goal: Ability to tolerate increased activity will improve Outcome: Progressing   Problem: Clinical Measurements: Goal: Ability to maintain a body temperature in the normal range will improve Outcome: Progressing   Problem: Respiratory: Goal: Ability to maintain adequate ventilation will improve Outcome: Progressing Goal: Ability to maintain a clear airway  will improve Outcome: Progressing   Problem: Education: Goal: Ability to describe self-care measures that may prevent or decrease complications (Diabetes Survival Skills Education) will improve Outcome: Progressing Goal: Individualized Educational Video(s) Outcome: Progressing   Problem: Coping: Goal: Ability to adjust to condition or change in health will improve Outcome: Progressing   Problem: Fluid Volume: Goal: Ability to maintain a balanced intake and output will improve Outcome: Progressing   Problem: Health Behavior/Discharge Planning: Goal: Ability to identify and utilize available resources and services will improve Outcome: Progressing Goal: Ability to manage health-related needs will improve Outcome: Progressing   Problem: Metabolic: Goal: Ability to maintain appropriate glucose levels will improve Outcome: Progressing   Problem: Nutritional: Goal: Maintenance of adequate nutrition will improve Outcome: Progressing Goal: Progress toward achieving an optimal weight will improve Outcome: Progressing   Problem: Skin Integrity: Goal: Risk for impaired skin integrity will decrease Outcome: Progressing   Problem: Tissue Perfusion: Goal: Adequacy of tissue perfusion will improve Outcome: Progressing   Problem: Education: Goal: Knowledge of disease and its progression will improve Outcome: Progressing Goal: Individualized Educational Video(s) Outcome: Progressing   Problem: Fluid Volume: Goal: Compliance with measures to maintain balanced fluid volume will improve Outcome: Progressing   Problem: Health Behavior/Discharge Planning: Goal: Ability to manage health-related needs will improve Outcome: Progressing   Problem: Nutritional: Goal: Ability to make healthy dietary choices will improve Outcome: Progressing   Problem: Clinical Measurements: Goal: Complications related to the disease process, condition or treatment will be avoided or  minimized Outcome: Progressing   Problem: Education: Goal: Knowledge of disease or condition will improve Outcome: Progressing Goal: Understanding of medication regimen will improve Outcome: Progressing Goal: Individualized Educational Video(s) Outcome: Progressing   Problem: Activity: Goal: Ability to tolerate increased activity will improve Outcome: Progressing   Problem: Cardiac: Goal: Ability to achieve and maintain adequate cardiopulmonary perfusion will improve Outcome: Progressing   Problem: Health Behavior/Discharge Planning: Goal: Ability to safely manage health-related needs after discharge will improve Outcome: Progressing   Problem: Education: Goal: Ability  to demonstrate management of disease process will improve Outcome: Progressing Goal: Ability to verbalize understanding of medication therapies will improve Outcome: Progressing Goal: Individualized Educational Video(s) Outcome: Progressing

## 2024-11-01 NOTE — Progress Notes (Signed)
 Discussion with Dr. Evonnie at the bedside. Pt is lethargic but arousable and oriented x 3-4. BP's still inconsistent with the readings but some of the more reliable pressures being in the 80's systolic with MAPs in the 50's. Cuff on the right upper arm as of right now. Have continuously tried several different locations. Tried manual was unable to auscultate. Radial pulse palpable but weak. Capillary refill is normal. Per Dr. Evonnie going to restart the Levo now at 2 mcg/kg/min. Will get RT to place arterial line again for more reliable blood pressures.

## 2024-11-01 NOTE — Progress Notes (Signed)
 PROGRESS NOTE  [name] [name] FMW:[id number] DOB: 12/19/1960 DOA: 10/24/2024 PCP: [name], MD  Brief History:  63 y.o. male with medical history significant for ESRD on dialysis, atrial fibrillation, systolic CHF, hypertension, diabetes, LBBB.  Patient presented to the ED with complaints of difficulty breathing yesterday about 2 to 3 weeks ago.  He has a bad cough that has been going on for about 15 years, but has worsened from baseline.  Reported some dizziness over the past few days, present to respectable position, and intermittent.  He denies dizziness at this time.  No chest pain, no lower extremity swelling.  Reports he never smoked cigarettes.  His last dialysis was on Friday- 2/5.  He did not get dialyzed today, and has not taken his medications today which include midodrine .  He was in the ED 12/2 with reports of dysuria.  He makes very little urine.  He was started on a course of ciprofloxacin  which he started 12/3, and has been compliant.  He was also started on doxycycline  11/30 for chest infiltrate-thought more likely to be fluid related.   ED Course: Tmax 98.6.  Heart rate 66-91.  Respirate rate 16.  Blood pressure systolic initially 130s now down to 60s on my evaluation.  O2 sats greater 93% on room air.  Lactic acid 2.1.  WBC 12.9.  Sodium 130.  COVID influenza RSV negative.  Troponin 225 >> 216.  CT chest without contrast-multifocal bronchopneumonia, tiny right and moderate partially loculated left pleural effusion.  Collapse/consolidation in left lower lobe may be due to atelectasis, difficult to exclude pneumonia.  Nephrology consulted, no emergent need for dialysis.  IV ceftriaxone  and azithromycin  started.  500ml N/s bolus given.   Assessment/Plan: Severe sepsis with septic shock  - secondary to multifocal pneumonia and MRSA bacteremia - blood cultures positive for MRSA - continue IV vancomycin  - ID consultation noted - weaned off norepi 10/29/24 - continue  midodrine  20 mg TID - pt has low BPs at baseline--SBPs 80s to 90s - Mental status at baseline--currently alert and oriented x 3   MRSA bacteremia  - 10/24/24 blood culture = MRSA - 10/26/24--blood culture = neg - 10/28/24 blood culture negative - HD cath removed on 10/28/24 - arterial line removed 10/28/24 - 12/11 TTE--EF 20-25%, G2DD, mild decrease RVF; severe AS, no vegetation - line holiday on sat/sun>>temporary HD cath placed 12/15 by Dr. Kallie -Appreciate ID recommendations - plan for 6 weeks of vanc IV>>end date 12/09/24 -Transfer to [facility] for tunneled catheter placement with   Left pleural effusion--transudate -10/26/2024--left thoracocentesis>> sent for cell count and cultures -does not appear to be empyema   Chronic hypotension  - he is normally on midodrine  TID - he now presents with sepsis physiology - weaned off norepi on 12/13 - has low BPs at baseline - continue midodrine  20 mg TID   ESRD on HD MWF - inpatient HD 12/15 - consultation from nephrologist   Chronic HFrEF - volume management mostly with HD -- 12/11 TTE--EF 20-25%, G2DD, mild decrease RVF; severe AS, no vegetation - makes small amount of urine - compensated - ESRD and low BPs limit GDMT   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  No vomiting or diarrhea  Objective: Vitals:   11/01/24 0911 11/01/24 0912 11/01/24 1141 11/01/24 1142  BP:  (!) 86/52 (!) 106/54   Pulse: 82 81 73 73  Resp: (!) 21 (!)  27 16 (!) 26  Temp:      TempSrc:      SpO2: 92% 92% 94%   Weight:      Height:        Intake/Output Summary (Last 24 hours) at 11/01/2024 1203 Last data filed at 11/01/2024 0800 Gross per 24 hour  Intake 360 ml  Output 2000 ml  Net -1640 ml   Weight change:  Exam:  General:  Pt is alert, follows commands appropriately, not in acute distress HEENT: No icterus, No thrush, No neck mass, [place]/AT Cardiovascular: RRR, S1/S2, no rubs, no gallops Respiratory: Bibasilar rales but no wheezing Abdomen: Soft/+BS, non tender, non distended, no guarding Extremities: No edema, No lymphangitis, No petechiae, No rashes, no synovitis' right BKA site without any erythema or drainage   Data Reviewed: I have personally reviewed following labs and imaging studies Basic Metabolic Panel: Recent Labs  Lab 10/28/24 0749 10/29/24 0343  NA 130* 130*  K 3.9 3.9  CL 90* 91*  CO2 25 25  GLUCOSE 234* 270*  BUN 59* 38*  CREATININE 10.90* 7.71*  CALCIUM  7.8* 8.3*  MG  --  2.4  PHOS 5.5*  --    Liver Function Tests: Recent Labs  Lab 10/28/24 0749 10/29/24 0343  AST  --  31  ALT  --  11  ALKPHOS  --  177*  BILITOT  --  1.4*  PROT  --  7.4  ALBUMIN  2.8* 2.8*   No results for input(s): LIPASE, AMYLASE in the last 168 hours. No results for input(s): AMMONIA in the last 168 hours. Coagulation Profile: No results for input(s): INR, PROTIME in the last 168 hours. CBC: Recent Labs  Lab 10/28/24 0749 10/29/24 0343  WBC 9.1 10.2  HGB 12.0* 12.4*  HCT 36.2* 38.0*  MCV 73.9* 73.8*  PLT 208 219   Cardiac Enzymes: No results for input(s): CKTOTAL, CKMB, CKMBINDEX, TROPONINI in the last 168 hours. BNP: Invalid input(s): POCBNP CBG: Recent Labs  Lab 10/31/24 1647 10/31/24 2142 11/01/24 0332 11/01/24 0735 11/01/24 1137  GLUCAP 163* 141* 259* 236* 273*   HbA1C: No results for input(s): HGBA1C in the last 72 hours. Urine analysis:    Component Value Date/Time    COLORURINE AMBER (A) 07/01/2022 1143   APPEARANCEUR CLEAR 07/01/2022 1143   LABSPEC 1.017 07/01/2022 1143   PHURINE 7.0 07/01/2022 1143   GLUCOSEU NEGATIVE 07/01/2022 1143   HGBUR NEGATIVE 07/01/2022 1143   BILIRUBINUR NEGATIVE 07/01/2022 1143   KETONESUR NEGATIVE 07/01/2022 1143   PROTEINUR >=300 (A) 07/01/2022 1143   NITRITE NEGATIVE 07/01/2022 1143   LEUKOCYTESUR NEGATIVE 07/01/2022 1143   Sepsis Labs: @LABRCNTIP (procalcitonin:4,lacticidven:4) ) Recent Results (from the past 240 hours)  Resp panel by RT-PCR (RSV, Flu A&B, Covid) Anterior Nasal Swab     Status: None   Collection Time: 10/24/24 10:20 AM   Specimen: Anterior Nasal Swab  Result Value Ref Range Status   SARS Coronavirus 2 by RT PCR NEGATIVE NEGATIVE Final  Comment: (NOTE) SARS-CoV-2 target nucleic acids are NOT DETECTED.  The SARS-CoV-2 RNA is generally detectable in upper respiratory specimens during the acute phase of infection. The lowest concentration of SARS-CoV-2 viral copies this assay can detect is 138 copies/mL. A negative result does not preclude SARS-Cov-2 infection and should not be used as the sole basis for treatment or other patient management decisions. A negative result may occur with  improper specimen collection/handling, submission of specimen other than nasopharyngeal swab, presence of viral mutation(s) within the areas targeted by this assay, and inadequate number of viral copies(<138 copies/mL). A negative result must be combined with clinical observations, patient history, and epidemiological information. The expected result is Negative.  Fact Sheet for Patients:  bloggercourse.com  Fact Sheet for Healthcare Providers:  seriousbroker.it  This test is no t yet approved or cleared by the [place]  FDA and  has been authorized for detection and/or diagnosis of SARS-CoV-2 by FDA under an Emergency Use Authorization (EUA). This EUA  will remain  in effect (meaning this test can be used) for the duration of the COVID-19 declaration under Section 564(b)(1) of the Act, 21 U.S.C.section 360bbb-3(b)(1), unless the authorization is terminated  or revoked sooner.       Influenza A by PCR NEGATIVE NEGATIVE Final   Influenza B by PCR NEGATIVE NEGATIVE Final    Comment: (NOTE) The Xpert Xpress SARS-CoV-2/FLU/RSV plus assay is intended as an aid in the diagnosis of influenza from Nasopharyngeal swab specimens and should not be used as a sole basis for treatment. Nasal washings and aspirates are unacceptable for Xpert Xpress SARS-CoV-2/FLU/RSV testing.  Fact Sheet for Patients: bloggercourse.com  Fact Sheet for Healthcare Providers: seriousbroker.it  This test is not yet approved or cleared by the [place]  FDA and has been authorized for detection and/or diagnosis of SARS-CoV-2 by FDA under an Emergency Use Authorization (EUA). This EUA will remain in effect (meaning this test can be used) for the duration of the COVID-19 declaration under Section 564(b)(1) of the Act, 21 U.S.C. section 360bbb-3(b)(1), unless the authorization is terminated or revoked.     Resp Syncytial Virus by PCR NEGATIVE NEGATIVE Final    Comment: (NOTE) Fact Sheet for Patients: bloggercourse.com  Fact Sheet for Healthcare Providers: seriousbroker.it  This test is not yet approved or cleared by the [place]  FDA and has been authorized for detection and/or diagnosis of SARS-CoV-2 by FDA under an Emergency Use Authorization (EUA). This EUA will remain in effect (meaning this test can be used) for the duration of the COVID-19 declaration under Section 564(b)(1) of the Act, 21 U.S.C. section 360bbb-3(b)(1), unless the authorization is terminated or revoked.  Performed at [facility], [address] [place], [place] [address]    Culture, blood (routine x 2)     Status: Abnormal   Collection Time: 10/24/24  2:07 PM   Specimen: BLOOD RIGHT FOREARM  Result Value Ref Range Status   Specimen Description   Final    BLOOD RIGHT FOREARM BOTTLES DRAWN AEROBIC AND ANAEROBIC Performed at [facility], [address], [place], [place] [address]    Special Requests   Final    Blood Culture results may not be optimal due to an inadequate volume of blood received in culture bottles Performed at [facility], [address] [place], [place] [address]    Culture  Setup Time   Final    IN BOTH AEROBIC AND ANAEROBIC BOTTLES GRAM POSITIVE COCCI IN CLUSTERS Gram Stain Report Called to,Read Back By and  Verified With: K. [facility] RN 10/25/24 @0709  BY J. WHITE Performed at [facility], [address], [place], [place] [address]    Culture (A)  Final    STAPHYLOCOCCUS AUREUS SUSCEPTIBILITIES PERFORMED ON PREVIOUS CULTURE WITHIN THE LAST 5 DAYS. Performed at [facility], [address] [place], [place] [address]    Report Status 10/27/2024 FINAL  Final  Culture, blood (routine x 2)     Status: Abnormal   Collection Time: 10/24/24  2:51 PM   Specimen: BLOOD  Result Value Ref Range Status   Specimen Description   Final    BLOOD BLOOD RIGHT ARM Performed at [facility], [address] [place], [place] [address]    Special Requests   Final    BOTTLES DRAWN AEROBIC AND ANAEROBIC Blood Culture adequate volume Performed at [facility], [address], [place], [place] [address]    Culture  Setup Time   Final    IN BOTH AEROBIC AND ANAEROBIC BOTTLES GRAM POSITIVE COCCI IN CLUSTERS Gram Stain Report Called to,Read Back By and Verified With: K. [facility] RN 10/25/24 @0709  BY J. WHITE CRITICAL RESULT CALLED TO, READ BACK BY AND VERIFIED WITH: PHARMD LAURIE POOLE 10/25/24 @1034  PR    Culture (A)  Final    METHICILLIN RESISTANT STAPHYLOCOCCUS AUREUS SEE SEPARATE REPORT Performed at [facility], [address],  [place], [place] [address]    Report Status 10/30/2024 FINAL  Final   Organism ID, Bacteria METHICILLIN RESISTANT STAPHYLOCOCCUS AUREUS  Final      Susceptibility   Methicillin resistant staphylococcus aureus - MIC*    CIPROFLOXACIN  >=8 RESISTANT Resistant     ERYTHROMYCIN >=8 RESISTANT Resistant     GENTAMICIN <=0.5 SENSITIVE Sensitive     OXACILLIN >=4 RESISTANT Resistant     TETRACYCLINE >=16 RESISTANT Resistant     VANCOMYCIN  1 SENSITIVE Sensitive     TRIMETH/SULFA <=10 SENSITIVE Sensitive     CLINDAMYCIN >=8 RESISTANT Resistant     RIFAMPIN <=0.5 SENSITIVE Sensitive     Inducible Clindamycin NEGATIVE Sensitive     LINEZOLID 2 SENSITIVE Sensitive     * METHICILLIN RESISTANT STAPHYLOCOCCUS AUREUS  Blood Culture ID Panel (Reflexed)     Status: Abnormal   Collection Time: 10/24/24  2:51 PM  Result Value Ref Range Status   Enterococcus faecalis NOT DETECTED NOT DETECTED Final   Enterococcus Faecium NOT DETECTED NOT DETECTED Final   Listeria monocytogenes NOT DETECTED NOT DETECTED Final   Staphylococcus species DETECTED (A) NOT DETECTED Final    Comment: CRITICAL RESULT CALLED TO, READ BACK BY AND VERIFIED WITH: PHARMD LAURIE POOLE 10/25/24 @1034  PR    Staphylococcus aureus (BCID) DETECTED (A) NOT DETECTED Final    Comment: Methicillin (oxacillin)-resistant Staphylococcus aureus (MRSA). MRSA is predictably resistant to beta-lactam antibiotics (except ceftaroline). Preferred therapy is vancomycin  unless clinically contraindicated. Patient requires contact precautions if  hospitalized. CRITICAL RESULT CALLED TO, READ BACK BY AND VERIFIED WITH: PHARMD LAURIE POOLE 10/25/24 @1034  PR    Staphylococcus epidermidis NOT DETECTED NOT DETECTED Final   Staphylococcus lugdunensis NOT DETECTED NOT DETECTED Final   Streptococcus species NOT DETECTED NOT DETECTED Final   Streptococcus agalactiae NOT DETECTED NOT DETECTED Final   Streptococcus pneumoniae NOT DETECTED NOT DETECTED Final    Streptococcus pyogenes NOT DETECTED NOT DETECTED Final   A.calcoaceticus-baumannii NOT DETECTED NOT DETECTED Final   Bacteroides fragilis NOT DETECTED NOT DETECTED Final   Enterobacterales NOT DETECTED NOT DETECTED Final   Enterobacter cloacae complex NOT DETECTED NOT  DETECTED Final   Escherichia coli NOT DETECTED NOT DETECTED Final   Klebsiella aerogenes NOT DETECTED NOT DETECTED Final   Klebsiella oxytoca NOT DETECTED NOT DETECTED Final   Klebsiella pneumoniae NOT DETECTED NOT DETECTED Final   Proteus species NOT DETECTED NOT DETECTED Final   Salmonella species NOT DETECTED NOT DETECTED Final   Serratia marcescens NOT DETECTED NOT DETECTED Final   Haemophilus influenzae NOT DETECTED NOT DETECTED Final   Neisseria meningitidis NOT DETECTED NOT DETECTED Final   Pseudomonas aeruginosa NOT DETECTED NOT DETECTED Final   Stenotrophomonas maltophilia NOT DETECTED NOT DETECTED Final   Candida albicans NOT DETECTED NOT DETECTED Final   Candida auris NOT DETECTED NOT DETECTED Final   Candida glabrata NOT DETECTED NOT DETECTED Final   Candida krusei NOT DETECTED NOT DETECTED Final   Candida parapsilosis NOT DETECTED NOT DETECTED Final   Candida tropicalis NOT DETECTED NOT DETECTED Final   Cryptococcus neoformans/gattii NOT DETECTED NOT DETECTED Final   Meth resistant mecA/C and MREJ DETECTED (A) NOT DETECTED Final    Comment: CRITICAL RESULT CALLED TO, READ BACK BY AND VERIFIED WITH: PHARMD LAURIE POOLE 10/25/24 @1034  PR Performed at [facility], [address] [place], [place] [address]   MIC (1 Drug)-     Status: Abnormal   Collection Time: 10/24/24  2:51 PM  Result Value Ref Range Status   Min Inhibitory Conc (1 Drug) Final report (A)  Corrected    Comment: (NOTE) Performed At: [facility] [address] [place], [place] [id number] Jennette Shorter MD Ey:[id number] CORRECTED ON 12/15 AT 1735: PREVIOUSLY REPORTED AS Preliminary report    Source PENDING  Incomplete  MIC  Result     Status: Abnormal   Collection Time: 10/24/24  2:51 PM  Result Value Ref Range Status   Result 1 (MIC) Comment (A)  Final    Comment: (NOTE) Methicillin - resistant Staphylococcus aureus Identification performed by account, not confirmed by this laboratory. Testing performed by broth microdilution. DAPTOMYCIN =0.5ug/ml SUSCEPTIBLE Performed At: [facility] [address] [place], [place] [id number] Jennette Shorter MD Ey:[id number]   MRSA Next Gen by PCR, Nasal     Status: Abnormal   Collection Time: 10/24/24  4:33 PM   Specimen: Nasal Mucosa; Nasal Swab  Result Value Ref Range Status   MRSA by PCR Next Gen DETECTED (A) NOT DETECTED Final    Comment: RESULT CALLED TO, READ BACK BY AND VERIFIED WITH: C KINSLEY,RN@1950  1208/25 MK (NOTE) The GeneXpert MRSA Assay (FDA approved for NASAL specimens only), is one component of a comprehensive MRSA colonization surveillance program. It is not intended to diagnose MRSA infection nor to guide or monitor treatment for MRSA infections. Test performance is not FDA approved in patients less than 56 years old. Performed at [facility], [address] [place], [place] [address]   Culture, blood (Routine X 2) w Reflex to ID Panel     Status: None   Collection Time: 10/26/24  7:10 AM   Specimen: BLOOD  Result Value Ref Range Status   Specimen Description BLOOD RT ALINE  Final   Special Requests   Final    BOTTLES DRAWN AEROBIC ONLY Blood Culture results may not be optimal due to an inadequate volume of blood received in culture bottles   Culture   Final    NO GROWTH 5 DAYS Performed at [facility], [address], [place], [place] [address]    Report Status 10/31/2024 FINAL  Final  Culture, blood (Routine X 2)  w Reflex to ID Panel     Status: None   Collection Time: 10/26/24  7:10 AM   Specimen: BLOOD  Result Value Ref Range Status   Specimen Description BLOOD BLOOD RIGHT ARM  Final   Special Requests   Final    BOTTLES  DRAWN AEROBIC AND ANAEROBIC Blood Culture adequate volume   Culture   Final    NO GROWTH 5 DAYS Performed at [facility], [address] [place], [place] [address]    Report Status 10/31/2024 FINAL  Final  Gram stain     Status: None   Collection Time: 10/26/24  1:05 PM   Specimen: Pleura  Result Value Ref Range Status   Specimen Description PLEURAL LEFT  Final   Special Requests PLEURAL LEFT  Final   Gram Stain   Final    PLEURAL CYTOSPIN SMEAR NO ORGANISMS SEEN WBC PRESENT,BOTH PMN AND MONONUCLEAR Performed at [facility], [address], [place], [place] [address]    Report Status 10/26/2024 FINAL  Final  Culture, body fluid w Gram Stain-bottle     Status: None   Collection Time: 10/26/24  1:05 PM   Specimen: Pleura  Result Value Ref Range Status   Specimen Description PLEURAL  Final   Special Requests   Final    BOTTLES DRAWN AEROBIC AND ANAEROBIC Blood Culture adequate volume   Culture   Final    NO GROWTH 5 DAYS Performed at [facility], [address] [place], [place] [address]    Report Status 10/31/2024 FINAL  Final  Culture, blood (Routine X 2) w Reflex to ID Panel     Status: None (Preliminary result)   Collection Time: 10/28/24  3:23 PM   Specimen: BLOOD  Result Value Ref Range Status   Specimen Description BLOOD BLOOD RIGHT ARM  Final   Special Requests   Final    BOTTLES DRAWN AEROBIC AND ANAEROBIC Blood Culture adequate volume   Culture   Final    NO GROWTH 4 DAYS Performed at [facility], [address], [place], [place] [address]    Report Status PENDING  Incomplete  Culture, blood (Routine X 2) w Reflex to ID Panel     Status: None (Preliminary result)   Collection Time: 10/28/24  3:23 PM   Specimen: BLOOD  Result Value Ref Range Status   Specimen Description BLOOD RIGHT ANTECUBITAL  Final   Special Requests   Final    BOTTLES DRAWN AEROBIC AND ANAEROBIC Blood Culture adequate volume   Culture   Final    NO GROWTH 4 DAYS Performed at [facility], [address], [place], [place] [address]    Report Status PENDING  Incomplete     Scheduled Meds:  aspirin  EC  81 mg Oral Q breakfast   Chlorhexidine  Gluconate Cloth  6 each Topical Q0600   cinacalcet   60 mg Oral Q breakfast   gabapentin   300 mg Oral Daily   heparin   5,000 Units Subcutaneous Q8H   insulin  aspart  0-5 Units Subcutaneous QHS   insulin  aspart  0-9 Units Subcutaneous TID WC   midodrine   20 mg Oral TID WC   sevelamer  carbonate  4,000 mg Oral TID WC   Continuous Infusions:  anticoagulant sodium citrate      norepinephrine  (LEVOPHED ) Adult infusion Stopped (10/29/24 1233)   vancomycin  1,000 mg (11/01/24 0011)    Procedures/Studies: ECHOCARDIOGRAM COMPLETE Result Date: 10/28/2024    ECHOCARDIOGRAM REPORT   Patient Name:   [name] FLYE Sr.  Date of Exam: 10/27/2024 Medical Rec #:  [id number]         Height:       73.0 in Accession #:    [id number]        Weight:       230.2 lb Date of Birth:  Jul 30, 1961        BSA:          2.284 m Patient Age:    63 years          BP:           96/50 mmHg Patient Gender: M                 HR:           62 bpm. Exam Location:  [facility] Procedure: 2D Echo (Both Spectral and Color Flow Doppler were utilized during            procedure). Indications:    Bacteremia R78.81  History:        Patient has prior history of Echocardiogram examinations, most                 recent 11/09/2023. Cardiomyopathy and CHF, CAD and Previous                 Myocardial Infarction, Arrythmias:Atrial Flutter and LBBB; Risk                 Factors:Diabetes and Hypertension. ESRD on dialysis [facility]).  Sonographer:    Aida Pizza RCS Referring Phys: [id number] CORNELIUS N VAN DAM  Sonographer Comments: Global longitudinal strain was attempted. IMPRESSIONS  1. Left ventricular ejection fraction, by estimation, is 20 to 25%. The left ventricle has severely decreased function. The left ventricle demonstrates global hypokinesis. There is mild left ventricular hypertrophy. Left  ventricular diastolic parameters  are consistent with Grade II diastolic dysfunction (pseudonormalization). Elevated left atrial pressure.  2. Right ventricular systolic function is mildly reduced. The right ventricular size is moderately enlarged. There is moderately elevated pulmonary artery systolic pressure.  3. The mitral valve is abnormal. Moderate mitral valve regurgitation. No evidence of mitral stenosis. Moderate mitral annular calcification.  4. The tricuspid valve is abnormal. Tricuspid valve regurgitation is moderate.  5. Severe low flow low gradient aortic stenosis. The aortic valve has an indeterminant number of cusps. There is severe calcifcation of the aortic valve. There is severe thickening of the aortic valve. Aortic valve regurgitation is mild to moderate. Severe aortic valve stenosis. Aortic valve area, by VTI measures 0.77 cm. Aortic valve mean gradient measures 24.0 mmHg. Aortic valve Vmax measures 3.20 m/s.  6. The inferior vena cava is dilated in size with <50% respiratory variability, suggesting right atrial pressure of 15 mmHg. FINDINGS  Left Ventricle: Left ventricular ejection fraction, by estimation, is 20 to 25%. The left ventricle has severely decreased function. The left ventricle demonstrates global hypokinesis. Strain was performed and the global longitudinal strain is indeterminate. The left ventricular internal cavity size was normal in size. There is mild left ventricular hypertrophy. Left ventricular diastolic parameters are consistent with Grade II diastolic dysfunction (pseudonormalization). Elevated left atrial pressure. Right Ventricle: The right ventricular size is moderately enlarged. Right vetricular wall thickness was not well visualized. Right ventricular systolic function is mildly reduced. There is moderately elevated pulmonary artery systolic pressure. The tricuspid regurgitant velocity is 3.11 m/s, and with an assumed right atrial pressure of 15 mmHg, the  estimated right ventricular systolic pressure is 53.7 mmHg. Left  Atrium: Left atrial size was normal in size. Right Atrium: Right atrial size was normal in size. Pericardium: There is no evidence of pericardial effusion. Mitral Valve: The MR vena contracta is 0.4 cm. The mitral valve is abnormal. There is moderate thickening of the mitral valve leaflet(s). There is moderate calcification of the mitral valve leaflet(s). Moderate mitral annular calcification. Moderate mitral valve regurgitation. No evidence of mitral valve stenosis. Tricuspid Valve: The tricuspid valve is abnormal. Tricuspid valve regurgitation is moderate . No evidence of tricuspid stenosis. Aortic Valve: Severe low flow low gradient aortic stenosis. The aortic valve has an indeterminant number of cusps. There is severe calcifcation of the aortic valve. There is severe thickening of the aortic valve. There is severe aortic valve annular calcification. Aortic valve regurgitation is mild to moderate. Aortic regurgitation PHT measures 435 msec. Severe aortic stenosis is present. Aortic valve mean gradient measures 24.0 mmHg. Aortic valve peak gradient measures 41.0 mmHg. Aortic valve area,  by VTI measures 0.77 cm. Pulmonic Valve: The pulmonic valve was not well visualized. Pulmonic valve regurgitation is mild. No evidence of pulmonic stenosis. Aorta: The aortic root is normal in size and structure. Venous: The inferior vena cava is dilated in size with less than 50% respiratory variability, suggesting right atrial pressure of 15 mmHg. IAS/Shunts: No atrial level shunt detected by color flow Doppler. Additional Comments: 3D was performed not requiring image post processing on an independent workstation and was abnormal.  LEFT VENTRICLE PLAX 2D LVIDd:         5.75 cm LVIDs:         5.30 cm LV PW:         1.10 cm LV IVS:        1.10 cm LVOT diam:     2.00 cm   3D Volume EF: LV SV:         49        3D EF:        38 % LV SV Index:   21        LV EDV:        217 ml LVOT Area:     3.14 cm  LV ESV:       134 ml                          LV SV:        83 ml RIGHT VENTRICLE TAPSE (M-mode): 1.4 cm LEFT ATRIUM             Index        RIGHT ATRIUM           Index LA diam:        4.90 cm 2.15 cm/m   RA Area:     18.70 cm LA Vol (A2C):   92.0 ml 40.28 ml/m  RA Volume:   56.00 ml  24.52 ml/m LA Vol (A4C):   70.6 ml 30.91 ml/m LA Biplane Vol: 81.3 ml 35.60 ml/m  AORTIC VALVE AV Area (Vmax):    0.69 cm AV Area (Vmean):   0.64 cm AV Area (VTI):     0.77 cm AV Vmax:           320.00 cm/s AV Vmean:          232.000 cm/s AV VTI:            0.635 m AV Peak Grad:      41.0 mmHg AV Mean  Grad:      24.0 mmHg LVOT Vmax:         70.30 cm/s LVOT Vmean:        47.600 cm/s LVOT VTI:          0.155 m LVOT/AV VTI ratio: 0.24 AI PHT:            435 msec  AORTA Ao Root diam: 3.70 cm MITRAL VALVE                  TRICUSPID VALVE MV Area (PHT): 4.60 cm       TR Peak grad:   38.7 mmHg MV Decel Time: 165 msec       TR Vmax:        311.00 cm/s MR Peak grad:    108.2 mmHg MR Mean grad:    64.0 mmHg    SHUNTS MR Vmax:         520.00 cm/s  Systemic VTI:  0.16 m MR Vmean:        370.0 cm/s   Systemic Diam: 2.00 cm MR PISA:         1.90 cm MR PISA Eff ROA: 11 mm MR PISA Radius:  0.55 cm MV E velocity: 145.00 cm/s MV A velocity: 70.20 cm/s MV E/A ratio:  2.07 Dorn Ross MD Electronically signed by Dorn Ross MD Signature Date/Time: 10/28/2024/2:06:38 PM    Final    US  THORACENTESIS ASP PLEURAL SPACE W/IMG GUIDE Result Date: 10/26/2024 INDICATION: Patient admitted 10/24/2024 with severe sepsis, MRSA bacteremia and new left pleural effusion. Request for diagnostic and therapeutic left thoracentesis. EXAM: ULTRASOUND GUIDED LEFT THORACENTESIS MEDICATIONS: 6 mL 1% lidocaine  COMPLICATIONS: None immediate. PROCEDURE: An ultrasound guided thoracentesis was thoroughly discussed with the patient and questions answered. The benefits, risks, alternatives and complications were also discussed.  The patient understands and wishes to proceed with the procedure. Written consent was obtained. Ultrasound was performed to localize and mark an adequate pocket of fluid in the left chest. The area was then prepped and draped in the normal sterile fashion. 1% Lidocaine  was used for local anesthesia. Under ultrasound guidance a 19 gauge, 7-cm, Yueh catheter was introduced. Thoracentesis was performed. The catheter was removed and a dressing applied. FINDINGS: A total of approximately 450 mL of clear yellow fluid was removed. Samples were sent to the laboratory as requested by the clinical team. IMPRESSION: Successful ultrasound guided left thoracentesis yielding 450 mL of pleural fluid. Performed by Clotilda Hesselbach, PA-C Electronically Signed   By: Ester Sides M.D.   On: 10/26/2024 16:13   DG Chest 1 View Result Date: 10/26/2024 EXAM: 1 VIEW(S) XRAY OF THE CHEST 10/26/2024 01:55:07 PM COMPARISON: 2 days ago. CLINICAL HISTORY: [id number] Status post thoracentesis [id number] Status post thoracentesis. FINDINGS: LINES, TUBES AND DEVICES: Stable left internal jugular dialysis catheter. LUNGS AND PLEURA: New right upper lobe opacity is noted concerning for possible infiltrate. Stable left basilar atelectasis and possible small pleural effusion is noted. No pneumothorax is noted. HEART AND MEDIASTINUM: Stable cardiomegaly. BONES AND SOFT TISSUES: No acute osseous abnormality. IMPRESSION: 1. New right upper lobe opacity, concerning for possible pneumonia. 2. Stable left basilar atelectasis with possible small pleural effusion. Electronically signed by: [name] Seip MD 10/26/2024 02:38 PM EST RP Workstation: [id number]   CT Chest Wo Contrast Result Date: 10/24/2024 CLINICAL DATA:  Pneumonia. Shortness of breath after dialysis Friday. EXAM: CT CHEST WITHOUT CONTRAST TECHNIQUE: Multidetector CT imaging of the chest was performed following the standard protocol without IV contrast.  RADIATION DOSE REDUCTION: This exam was  performed according to the departmental dose-optimization program which includes automated exposure control, adjustment of the mA and/or kV according to patient size and/or use of iterative reconstruction technique. COMPARISON:  11/08/2023. FINDINGS: Cardiovascular: Atherosclerotic calcification of the aorta, aortic valve and coronary arteries. Enlarged pulmonic trunk and heart. No pericardial effusion. Left IJ dialysis catheter terminates in the right atrium. Mediastinum/Nodes: No pathologically enlarged mediastinal or axillary lymph nodes. Hilar regions are difficult to definitively evaluate without IV contrast. Esophagus is grossly unremarkable. Lungs/Pleura: Image quality is degraded by expiratory phase imaging and respiratory motion. Peribronchovascular ground-glass and consolidation in the anterior segment right upper lobe and likely elsewhere within the right middle and right lower lobes. Tiny right and moderate left pleural effusion, the latter of which is partially loculated. Compressive collapse/consolidation in the left lower lobe and to a lesser extent, lingula. There may be tracheobronchomalacia. Upper Abdomen: Liver margin is slightly irregular. Bilateral renal stones and/or renal vascular calcifications. Visualized portions of the liver, gallbladder, adrenal glands, kidneys, spleen, pancreas, stomach and bowel are otherwise grossly unremarkable. No upper abdominal adenopathy. Slight nonspecific haziness in the left upper quadrant omentum. Musculoskeletal: Degenerative changes in the spine. Old sternal fracture. IMPRESSION: 1. Multilobar bronchopneumonia. 2. Tiny right and moderate partially loculated left pleural effusions. Collapse/consolidation in the left lower lobe may be due to atelectasis. Difficult to exclude pneumonia. 3. 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 4. Liver margin is slightly irregular, raising suspicion for cirrhosis. 5. Bilateral renal stones and/or renal vascular calcifications. 6.  Aortic atherosclerosis (ICD10-I70.0). Coronary artery calcification. 7. Enlarged pulmonic trunk, indicative of pulmonary arterial hypertension. Electronically Signed   By: Newell Eke M.D.   On: 10/24/2024 14:15   DG Chest Port 1 View Result Date: 10/24/2024 CLINICAL DATA:  Shortness of breath. EXAM: PORTABLE CHEST 1 VIEW COMPARISON:  10/16/2024 FINDINGS: Low volume film. The cardio pericardial silhouette is enlarged. Retrocardiac collapse/consolidation with effusion is similar to prior. No pulmonary edema. Left IJ central line tip overlies the mid SVC level. IMPRESSION: Low volume film with persistent retrocardiac collapse/consolidation and effusion. Electronically Signed   By: Camellia Candle M.D.   On: 10/24/2024 11:31   DG Chest Port 1 View Result Date: 10/16/2024 EXAM: 1 VIEW(S) XRAY OF THE CHEST 10/16/2024 12:11:00 PM COMPARISON: 01/13/2024 CLINICAL HISTORY: sob FINDINGS: LINES, TUBES AND DEVICES: Tunneled left IJ hemodialysis catheter with tip at superior cavoatrial junction. LUNGS AND PLEURA: Low lung volumes. New left retrocardiac airspace opacity. Possible small left pleural effusion. No pneumothorax. HEART AND MEDIASTINUM: The cardiac silhouette is unremarkable. Aortic atherosclerosis is present. BONES AND SOFT TISSUES: No acute osseous abnormality. IMPRESSION: 1. New left retrocardiac airspace opacity and possible small left pleural effusion. 2. Low lung volumes. Electronically signed by: Evalene Coho MD 10/16/2024 12:16 PM EST RP Workstation: [id number] Alm Schneider, DO  [facility]  If 7PM-7AM, please contact night-coverage [email or web address] Password [email or web address] 11/01/2024, 12:03 PM   LOS: 8 days

## 2024-11-02 DIAGNOSIS — Z992 Dependence on renal dialysis: Secondary | ICD-10-CM | POA: Diagnosis not present

## 2024-11-02 DIAGNOSIS — B9562 Methicillin resistant Staphylococcus aureus infection as the cause of diseases classified elsewhere: Secondary | ICD-10-CM | POA: Diagnosis not present

## 2024-11-02 DIAGNOSIS — E1122 Type 2 diabetes mellitus with diabetic chronic kidney disease: Secondary | ICD-10-CM | POA: Diagnosis not present

## 2024-11-02 DIAGNOSIS — I5022 Chronic systolic (congestive) heart failure: Secondary | ICD-10-CM | POA: Diagnosis not present

## 2024-11-02 DIAGNOSIS — I739 Peripheral vascular disease, unspecified: Secondary | ICD-10-CM

## 2024-11-02 DIAGNOSIS — J188 Other pneumonia, unspecified organism: Secondary | ICD-10-CM | POA: Diagnosis not present

## 2024-11-02 DIAGNOSIS — S88111A Complete traumatic amputation at level between knee and ankle, right lower leg, initial encounter: Secondary | ICD-10-CM

## 2024-11-02 DIAGNOSIS — Z794 Long term (current) use of insulin: Secondary | ICD-10-CM | POA: Diagnosis not present

## 2024-11-02 DIAGNOSIS — I959 Hypotension, unspecified: Secondary | ICD-10-CM | POA: Diagnosis not present

## 2024-11-02 DIAGNOSIS — N186 End stage renal disease: Secondary | ICD-10-CM | POA: Diagnosis not present

## 2024-11-02 DIAGNOSIS — A4102 Sepsis due to Methicillin resistant Staphylococcus aureus: Secondary | ICD-10-CM | POA: Diagnosis not present

## 2024-11-02 DIAGNOSIS — R6521 Severe sepsis with septic shock: Secondary | ICD-10-CM | POA: Diagnosis not present

## 2024-11-02 LAB — GLUCOSE, CAPILLARY
Glucose-Capillary: 316 mg/dL — ABNORMAL HIGH (ref 70–99)
Glucose-Capillary: 364 mg/dL — ABNORMAL HIGH (ref 70–99)
Glucose-Capillary: 182 mg/dL — ABNORMAL HIGH (ref 70–99)
Glucose-Capillary: 204 mg/dL — ABNORMAL HIGH (ref 70–99)
Glucose-Capillary: 238 mg/dL — ABNORMAL HIGH (ref 70–99)
Glucose-Capillary: 241 mg/dL — ABNORMAL HIGH (ref 70–99)

## 2024-11-02 LAB — COMPREHENSIVE METABOLIC PANEL WITH GFR
ALT: 36 U/L (ref 0–44)
AST: 66 U/L — ABNORMAL HIGH (ref 15–41)
Albumin: 3.1 g/dL — ABNORMAL LOW (ref 3.5–5.0)
Alkaline Phosphatase: 353 U/L — ABNORMAL HIGH (ref 38–126)
Anion gap: 19 — ABNORMAL HIGH (ref 5–15)
BUN: 53 mg/dL — ABNORMAL HIGH (ref 8–23)
CO2: 18 mmol/L — ABNORMAL LOW (ref 22–32)
Calcium: 7.8 mg/dL — ABNORMAL LOW (ref 8.9–10.3)
Chloride: 90 mmol/L — ABNORMAL LOW (ref 98–111)
Creatinine, Ser: 8.24 mg/dL — ABNORMAL HIGH (ref 0.61–1.24)
GFR, Estimated: 7 mL/min — ABNORMAL LOW (ref >60)
Glucose, Bld: 326 mg/dL — ABNORMAL HIGH (ref 70–99)
Potassium: 4.7 mmol/L (ref 3.5–5.1)
Sodium: 126 mmol/L — ABNORMAL LOW (ref 135–145)
Total Bilirubin: 1.3 mg/dL — ABNORMAL HIGH (ref 0.0–1.2)
Total Protein: 8 g/dL (ref 6.5–8.1)

## 2024-11-02 LAB — CBC
HCT: 36.7 % — ABNORMAL LOW (ref 39.0–52.0)
Hemoglobin: 12 g/dL — ABNORMAL LOW (ref 13.0–17.0)
MCH: 24.7 pg — ABNORMAL LOW (ref 26.0–34.0)
MCHC: 32.7 g/dL (ref 30.0–36.0)
MCV: 75.7 fL — ABNORMAL LOW (ref 80.0–100.0)
Platelets: 388 K/uL (ref 150–400)
RBC: 4.85 MIL/uL (ref 4.22–5.81)
RDW: 25.2 % — ABNORMAL HIGH (ref 11.5–15.5)
WBC: 9.3 K/uL (ref 4.0–10.5)
nRBC: 0 % (ref 0.0–0.2)

## 2024-11-02 LAB — CULTURE, BLOOD (ROUTINE X 2)
Culture: NO GROWTH
Culture: NO GROWTH
Special Requests: ADEQUATE
Special Requests: ADEQUATE

## 2024-11-02 LAB — VANCOMYCIN, TROUGH: Vancomycin Tr: 25 ug/mL (ref 15–20)

## 2024-11-02 MED ORDER — HEPARIN SODIUM (PORCINE) 1000 UNIT/ML DIALYSIS
20.0000 [IU]/kg | INTRAMUSCULAR | Status: DC | PRN
  Administered 2024-11-04 (×2): 2100 [IU] via INTRAVENOUS_CENTRAL
  Filled 2024-11-02 (×2): qty 3

## 2024-11-02 MED ORDER — INSULIN ASPART 100 UNIT/ML IJ SOLN
2.0000 [IU] | Freq: Three times a day (TID) | INTRAMUSCULAR | Status: DC
  Administered 2024-11-03 – 2024-11-05 (×7): 2 [IU] via SUBCUTANEOUS
  Filled 2024-11-02 (×7): qty 2

## 2024-11-02 MED ORDER — NOREPINEPHRINE 4 MG/250ML-% IV SOLN
0.0000 ug/min | INTRAVENOUS | Status: DC
  Administered 2024-11-02: 21:00:00 8 ug/min via INTRAVENOUS
  Administered 2024-11-02: 20:00:00 4 ug/min via INTRAVENOUS
  Administered 2024-11-03: 10:00:00 7 ug/min via INTRAVENOUS
  Administered 2024-11-03: 03:00:00 12 ug/min via INTRAVENOUS
  Administered 2024-11-03: 22:00:00 10 ug/min via INTRAVENOUS
  Administered 2024-11-04: 9 ug/min via INTRAVENOUS
  Administered 2024-11-04: 10 ug/min via INTRAVENOUS
  Administered 2024-11-04: 12 ug/min via INTRAVENOUS
  Administered 2024-11-04: 21 ug/min via INTRAVENOUS
  Administered 2024-11-05: 13 ug/min via INTRAVENOUS
  Administered 2024-11-05: 20 ug/min via INTRAVENOUS
  Administered 2024-11-05: 16 ug/min via INTRAVENOUS
  Filled 2024-11-02 (×11): qty 250

## 2024-11-02 NOTE — Progress Notes (Signed)
 PROGRESS NOTE  [name] FMW:[id number] DOB: Mar 24, 1961 DOA: 10/24/2024 PCP: [name], MD  Brief History:  63 y.o. male with medical history significant for ESRD on dialysis, atrial fibrillation, systolic CHF, hypertension, diabetes, LBBB.  Patient presented to the ED with complaints of difficulty breathing yesterday about 2 to 3 weeks ago.  He has a bad cough that has been going on for about 15 years, but has worsened from baseline.  Reported some dizziness over the past few days, present to respectable position, and intermittent.  He denies dizziness at this time.  No chest pain, no lower extremity swelling.  Reports he never smoked cigarettes.  His last dialysis was on Friday- 2/5.  He did not get dialyzed today, and has not taken his medications today which include midodrine .  He was in the ED 12/2 with reports of dysuria.  He makes very little urine.  He was started on a course of ciprofloxacin  which he started 12/3, and has been compliant.  He was also started on doxycycline  11/30 for chest infiltrate-thought more likely to be fluid related.   ED Course: Tmax 98.6.  Heart rate 66-91.  Respirate rate 16.  Blood pressure systolic initially 130s now down to 60s on my evaluation.  O2 sats greater 93% on room air.  Lactic acid 2.1.  WBC 12.9.  Sodium 130.  COVID influenza RSV negative.  Troponin 225 >> 216.  CT chest without contrast-multifocal bronchopneumonia, tiny right and moderate partially loculated left pleural effusion.  Collapse/consolidation in left lower lobe may be due to atelectasis, difficult to exclude pneumonia.  Nephrology consulted, no emergent need for dialysis.  IV ceftriaxone  and azithromycin  started.  500ml N/s bolus given.      Assessment/Plan: Severe sepsis with septic shock  - secondary to multifocal pneumonia and MRSA bacteremia - blood cultures positive for MRSA - continue IV vancomycin  - ID consultation noted - continue midodrine  20 mg TID -  Continue pressor support and wean off as tolerated - Patient has been transferred to ICU at [facility].   MRSA bacteremia  - 10/24/24 blood culture = MRSA - 10/26/24--blood culture = neg - 10/28/24 blood culture negative - HD cath removed on 10/28/24 - arterial line removed 10/28/24 - 12/11 TTE--EF 20-25%, G2DD, mild decrease RVF; severe AS, no vegetation seen on 2D echo - TEE has been held as patient hemodynamically unstable and requiring pressure support. - line holiday on for 48 hours (12/13 >> 12/14) - temporary femoral HD cath placed 12/15 by Dr. Kallie -Appreciate ID recommendations; ID will continue following patient. - plan for 6 weeks of vanc IV>>end date 12/09/24 -Transfer to [facility] for tunneled catheter placement with IR.   Left pleural effusion--transudate -10/26/2024--left thoracocentesis>> sent for cell count and cultures (final results pending) -does not appear to be empyema. - Continue current antibiotics.   Chronic hypotension  - he is normally on midodrine  TID - he now presents with sepsis physiology - In the requiring the use of Levophed  support on 11/01/24 (currently 6--7 mcg drip) - continue midodrine  20 mg TID   ESRD on HD MWF - inpatient HD 11/02/2024 - Appreciate assistance and consultation from nephrologist -Continue to follow recommendations.   Chronic HFrEF - volume management mostly with HD -- 12/11 TTE--EF 20-25%, G2DD, mild decrease RVF; severe AS, no vegetation - Patient still Makes small amount of urine - Appears to be stable and compensated currently - Continue volume management  with hemodialysis. 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      Family  Communication:  daughter 12/15   Consultants:  ID, renal, general surgery   Code Status:  FULL    DVT Prophylaxis:   Heparin       Procedures: As Listed in Progress Note Above   Antibiotics: Vanc 12/8>>    Subjective: Patient reports chest pain, nausea, vomiting, abdominal pain and shortness of breath.  Blood pressure continued to be running low and throughout the night having the requiring the use of Levophed .  Actively having dialysis at time of exam.  Objective: Vitals:   11/02/24 1230 11/02/24 1300 11/02/24 1335 11/02/24 1340  BP: (!) 120/51  125/60   Pulse: 83 84    Resp: 19 (!) 22    Temp:   98.4 F (36.9 C)   TempSrc:   Oral   SpO2: 97% 96%    Weight:    110.5 kg  Height:        Intake/Output Summary (Last 24 hours) at 11/02/2024 1426 Last data filed at 11/02/2024 1335 Gross per 24 hour  Intake 851.56 ml  Output 1500 ml  Net -648.44 ml   Weight change:   Exam: General exam: Alert, awake, oriented x 3; denying chest pain and shortness of breath.  Patient in no acute distress. Respiratory system: Good saturation on room air. Cardiovascular system:RRR.  No rubs, no gallops, no JVD.  Positive murmur appreciated on exam. Gastrointestinal system: Abdomen is nondistended, soft and nontender.  Positive bowel sounds appreciated on exam. Central nervous system:No focal neurological deficits. Extremities: No cyanosis or clubbing; trace to 1+ edema appreciated in his lower extremities; right BKA. Skin: No petechiae. Psychiatry: Judgement and insight appear normal. Mood & affect appropriate.    Data Reviewed: I have personally reviewed following labs and imaging studies  Basic Metabolic Panel: Recent Labs  Lab 10/28/24 0749 10/29/24 0343 11/02/24 0439  NA 130* 130* 126*  K 3.9 3.9 4.7  CL 90* 91* 90*  CO2 25 25 18*  GLUCOSE 234* 270* 326*  BUN 59* 38* 53*  CREATININE 10.90* 7.71* 8.24*  CALCIUM  7.8* 8.3* 7.8*  MG  --  2.4  --   PHOS 5.5*  --   --     Liver Function Tests: Recent Labs  Lab 10/28/24 0749 10/29/24 0343 11/02/24 0439  AST  --  31 66*  ALT  --  11 36  ALKPHOS  --  177* 353*  BILITOT  --  1.4* 1.3*  PROT  --  7.4 8.0  ALBUMIN  2.8* 2.8* 3.1*   CBC: Recent Labs  Lab 10/28/24 0749 10/29/24 0343 11/02/24 0439  WBC 9.1 10.2 9.3  HGB 12.0* 12.4* 12.0*  HCT 36.2* 38.0* 36.7*  MCV 73.9* 73.8* 75.7*  PLT 208 219 388   Cardiac Enzymes: No results for input(s): CKTOTAL, CKMB, CKMBINDEX, TROPONINI in the last 168 hours. BNP: Invalid input(s): POCBNP CBG: Recent Labs  Lab 11/01/24 1634 11/01/24 2203 11/02/24 0414 11/02/24 0738 11/02/24 1212  GLUCAP 161* 269* 316* 364* 182*   HbA1C: No results for input(s): HGBA1C in the last 72 hours. Urine analysis:    Component Value Date/Time   COLORURINE AMBER (A) 07/01/2022 1143  APPEARANCEUR CLEAR 07/01/2022 1143   LABSPEC 1.017 07/01/2022 1143   PHURINE 7.0 07/01/2022 1143   GLUCOSEU NEGATIVE 07/01/2022 1143   HGBUR NEGATIVE 07/01/2022 1143   BILIRUBINUR NEGATIVE 07/01/2022 1143   KETONESUR NEGATIVE 07/01/2022 1143   PROTEINUR >=300 (A) 07/01/2022 1143   NITRITE NEGATIVE 07/01/2022 1143   LEUKOCYTESUR NEGATIVE 07/01/2022 1143   Sepsis Labs:  Recent Results (from the past 240 hours)  Resp panel by RT-PCR (RSV, Flu A&B, Covid) Anterior Nasal Swab     Status: None   Collection Time: 10/24/24 10:20 AM   Specimen: Anterior Nasal Swab  Result Value Ref Range Status   SARS Coronavirus 2 by RT PCR NEGATIVE NEGATIVE Final    Comment: (NOTE) SARS-CoV-2 target nucleic acids are NOT DETECTED.  The SARS-CoV-2 RNA is generally detectable in upper respiratory specimens during the acute phase of infection. The lowest concentration of SARS-CoV-2 viral copies this assay can detect is 138 copies/mL. A negative result does not preclude SARS-Cov-2 infection and should not be used as the sole basis for treatment or other patient management decisions. A  negative result may occur with  improper specimen collection/handling, submission of specimen other than nasopharyngeal swab, presence of viral mutation(s) within the areas targeted by this assay, and inadequate number of viral copies(<138 copies/mL). A negative result must be combined with clinical observations, patient history, and epidemiological information. The expected result is Negative.  Fact Sheet for Patients:  bloggercourse.com  Fact Sheet for Healthcare Providers:  seriousbroker.it  This test is no t yet approved or cleared by the [place]  FDA and  has been authorized for detection and/or diagnosis of SARS-CoV-2 by FDA under an Emergency Use Authorization (EUA). This EUA will remain  in effect (meaning this test can be used) for the duration of the COVID-19 declaration under Section 564(b)(1) of the Act, 21 U.S.C.section 360bbb-3(b)(1), unless the authorization is terminated  or revoked sooner.       Influenza A by PCR NEGATIVE NEGATIVE Final   Influenza B by PCR NEGATIVE NEGATIVE Final    Comment: (NOTE) The Xpert Xpress SARS-CoV-2/FLU/RSV plus assay is intended as an aid in the diagnosis of influenza from Nasopharyngeal swab specimens and should not be used as a sole basis for treatment. Nasal washings and aspirates are unacceptable for Xpert Xpress SARS-CoV-2/FLU/RSV testing.  Fact Sheet for Patients: bloggercourse.com  Fact Sheet for Healthcare Providers: seriousbroker.it  This test is not yet approved or cleared by the [place]  FDA and has been authorized for detection and/or diagnosis of SARS-CoV-2 by FDA under an Emergency Use Authorization (EUA). This EUA will remain in effect (meaning this test can be used) for the duration of the COVID-19 declaration under Section 564(b)(1) of the Act, 21 U.S.C. section 360bbb-3(b)(1), unless the authorization  is terminated or revoked.     Resp Syncytial Virus by PCR NEGATIVE NEGATIVE Final    Comment: (NOTE) Fact Sheet for Patients: bloggercourse.com  Fact Sheet for Healthcare Providers: seriousbroker.it  This test is not yet approved or cleared by the [place]  FDA and has been authorized for detection and/or diagnosis of SARS-CoV-2 by FDA under an Emergency Use Authorization (EUA). This EUA will remain in effect (meaning this test can be used) for the duration of the COVID-19 declaration under Section 564(b)(1) of the Act, 21 U.S.C. section 360bbb-3(b)(1), unless the authorization is terminated or revoked.  Performed at [facility], [address] [place], [place] [address]   Culture, blood (routine x 2)     Status:  Abnormal   Collection Time: 10/24/24  2:07 PM   Specimen: BLOOD RIGHT FOREARM  Result Value Ref Range Status   Specimen Description   Final    BLOOD RIGHT FOREARM BOTTLES DRAWN AEROBIC AND ANAEROBIC Performed at [facility], [address] [place], [place] [address]    Special Requests   Final    Blood Culture results may not be optimal due to an inadequate volume of blood received in culture bottles Performed at [facility], [address], [place], [place] [address]    Culture  Setup Time   Final    IN BOTH AEROBIC AND ANAEROBIC BOTTLES GRAM POSITIVE COCCI IN CLUSTERS Gram Stain Report Called to,Read Back By and Verified With: K. [facility] RN 10/25/24 @0709  BY J. WHITE Performed at [facility], [address], [place], [place] [address]    Culture (A)  Final    STAPHYLOCOCCUS AUREUS SUSCEPTIBILITIES PERFORMED ON PREVIOUS CULTURE WITHIN THE LAST 5 DAYS. Performed at [facility], [address] [place], [place] [address]    Report Status 10/27/2024 FINAL  Final  Culture, blood (routine x 2)     Status: Abnormal   Collection Time: 10/24/24  2:51 PM   Specimen: BLOOD  Result Value Ref Range Status    Specimen Description   Final    BLOOD BLOOD RIGHT ARM Performed at [facility], [address], [place], [place] [address]    Special Requests   Final    BOTTLES DRAWN AEROBIC AND ANAEROBIC Blood Culture adequate volume Performed at [facility], [address], [place], [place] [address]    Culture  Setup Time   Final    IN BOTH AEROBIC AND ANAEROBIC BOTTLES GRAM POSITIVE COCCI IN CLUSTERS Gram Stain Report Called to,Read Back By and Verified With: K. [facility] RN 10/25/24 @0709  BY J. WHITE CRITICAL RESULT CALLED TO, READ BACK BY AND VERIFIED WITH: PHARMD LAURIE POOLE 10/25/24 @1034  PR    Culture (A)  Final    METHICILLIN RESISTANT STAPHYLOCOCCUS AUREUS SEE SEPARATE REPORT Performed at [facility], [address], [place], [place] [address]    Report Status 10/30/2024 FINAL  Final   Organism ID, Bacteria METHICILLIN RESISTANT STAPHYLOCOCCUS AUREUS  Final      Susceptibility   Methicillin resistant staphylococcus aureus - MIC*    CIPROFLOXACIN  >=8 RESISTANT Resistant     ERYTHROMYCIN >=8 RESISTANT Resistant     GENTAMICIN <=0.5 SENSITIVE Sensitive     OXACILLIN >=4 RESISTANT Resistant     TETRACYCLINE >=16 RESISTANT Resistant     VANCOMYCIN  1 SENSITIVE Sensitive     TRIMETH/SULFA <=10 SENSITIVE Sensitive     CLINDAMYCIN >=8 RESISTANT Resistant     RIFAMPIN <=0.5 SENSITIVE Sensitive     Inducible Clindamycin NEGATIVE Sensitive     LINEZOLID 2 SENSITIVE Sensitive     * METHICILLIN RESISTANT STAPHYLOCOCCUS AUREUS  Blood Culture ID Panel (Reflexed)     Status: Abnormal   Collection Time: 10/24/24  2:51 PM  Result Value Ref Range Status   Enterococcus faecalis NOT DETECTED NOT DETECTED Final   Enterococcus Faecium NOT DETECTED NOT DETECTED Final   Listeria monocytogenes NOT DETECTED NOT DETECTED Final   Staphylococcus species DETECTED (A) NOT DETECTED Final    Comment: CRITICAL RESULT CALLED TO, READ BACK BY AND VERIFIED WITH: PHARMD LAURIE POOLE 10/25/24 @1034  PR     Staphylococcus aureus (BCID) DETECTED (A) NOT DETECTED Final    Comment: Methicillin (oxacillin)-resistant Staphylococcus aureus (MRSA). MRSA is predictably resistant to  beta-lactam antibiotics (except ceftaroline). Preferred therapy is vancomycin  unless clinically contraindicated. Patient requires contact precautions if  hospitalized. CRITICAL RESULT CALLED TO, READ BACK BY AND VERIFIED WITH: PHARMD LAURIE POOLE 10/25/24 @1034  PR    Staphylococcus epidermidis NOT DETECTED NOT DETECTED Final   Staphylococcus lugdunensis NOT DETECTED NOT DETECTED Final   Streptococcus species NOT DETECTED NOT DETECTED Final   Streptococcus agalactiae NOT DETECTED NOT DETECTED Final   Streptococcus pneumoniae NOT DETECTED NOT DETECTED Final   Streptococcus pyogenes NOT DETECTED NOT DETECTED Final   A.calcoaceticus-baumannii NOT DETECTED NOT DETECTED Final   Bacteroides fragilis NOT DETECTED NOT DETECTED Final   Enterobacterales NOT DETECTED NOT DETECTED Final   Enterobacter cloacae complex NOT DETECTED NOT DETECTED Final   Escherichia coli NOT DETECTED NOT DETECTED Final   Klebsiella aerogenes NOT DETECTED NOT DETECTED Final   Klebsiella oxytoca NOT DETECTED NOT DETECTED Final   Klebsiella pneumoniae NOT DETECTED NOT DETECTED Final   Proteus species NOT DETECTED NOT DETECTED Final   Salmonella species NOT DETECTED NOT DETECTED Final   Serratia marcescens NOT DETECTED NOT DETECTED Final   Haemophilus influenzae NOT DETECTED NOT DETECTED Final   Neisseria meningitidis NOT DETECTED NOT DETECTED Final   Pseudomonas aeruginosa NOT DETECTED NOT DETECTED Final   Stenotrophomonas maltophilia NOT DETECTED NOT DETECTED Final   Candida albicans NOT DETECTED NOT DETECTED Final   Candida auris NOT DETECTED NOT DETECTED Final   Candida glabrata NOT DETECTED NOT DETECTED Final   Candida krusei NOT DETECTED NOT DETECTED Final   Candida parapsilosis NOT DETECTED NOT DETECTED Final   Candida tropicalis NOT DETECTED NOT  DETECTED Final   Cryptococcus neoformans/gattii NOT DETECTED NOT DETECTED Final   Meth resistant mecA/C and MREJ DETECTED (A) NOT DETECTED Final    Comment: CRITICAL RESULT CALLED TO, READ BACK BY AND VERIFIED WITH: PHARMD LAURIE POOLE 10/25/24 @1034  PR Performed at [facility], [address] [place], [place] [address]   MIC (1 Drug)-     Status: Abnormal   Collection Time: 10/24/24  2:51 PM  Result Value Ref Range Status   Min Inhibitory Conc (1 Drug) Final report (A)  Corrected    Comment: (NOTE) Performed At: [facility] [address] [place], [place] [id number] Jennette Shorter MD Ey:[id number] CORRECTED ON 12/15 AT 1735: PREVIOUSLY REPORTED AS Preliminary report    Source PENDING  Incomplete  MIC Result     Status: Abnormal   Collection Time: 10/24/24  2:51 PM  Result Value Ref Range Status   Result 1 (MIC) Comment (A)  Final    Comment: (NOTE) Methicillin - resistant Staphylococcus aureus Identification performed by account, not confirmed by this laboratory. Testing performed by broth microdilution. DAPTOMYCIN =0.5ug/ml SUSCEPTIBLE Performed At: [facility] [address] [place], [place] [id number] Jennette Shorter MD Ey:[id number]   MRSA Next Gen by PCR, Nasal     Status: Abnormal   Collection Time: 10/24/24  4:33 PM   Specimen: Nasal Mucosa; Nasal Swab  Result Value Ref Range Status   MRSA by PCR Next Gen DETECTED (A) NOT DETECTED Final    Comment: RESULT CALLED TO, READ BACK BY AND VERIFIED WITH: C KINSLEY,RN@1950  1208/25 MK (NOTE) The GeneXpert MRSA Assay (FDA approved for NASAL specimens only), is one component of a comprehensive MRSA colonization surveillance program. It is not intended to diagnose MRSA infection nor to guide or monitor treatment for MRSA infections. Test performance is not FDA approved in patients less than 32 years old. Performed at [facility],  [address], [place], [place] [address]   Culture, blood (Routine X  2) w Reflex to ID Panel     Status: None   Collection Time: 10/26/24  7:10 AM   Specimen: BLOOD  Result Value Ref Range Status   Specimen Description BLOOD RT ALINE  Final   Special Requests   Final    BOTTLES DRAWN AEROBIC ONLY Blood Culture results may not be optimal due to an inadequate volume of blood received in culture bottles   Culture   Final    NO GROWTH 5 DAYS Performed at [facility], [address] [place], [place] [address]    Report Status 10/31/2024 FINAL  Final  Culture, blood (Routine X 2) w Reflex to ID Panel     Status: None   Collection Time: 10/26/24  7:10 AM   Specimen: BLOOD  Result Value Ref Range Status   Specimen Description BLOOD BLOOD RIGHT ARM  Final   Special Requests   Final    BOTTLES DRAWN AEROBIC AND ANAEROBIC Blood Culture adequate volume   Culture   Final    NO GROWTH 5 DAYS Performed at [facility], [address], [place], [place] [address]    Report Status 10/31/2024 FINAL  Final  Gram stain     Status: None   Collection Time: 10/26/24  1:05 PM   Specimen: Pleura  Result Value Ref Range Status   Specimen Description PLEURAL LEFT  Final   Special Requests PLEURAL LEFT  Final   Gram Stain   Final    PLEURAL CYTOSPIN SMEAR NO ORGANISMS SEEN WBC PRESENT,BOTH PMN AND MONONUCLEAR Performed at [facility], [address], [place], [place] [address]    Report Status 10/26/2024 FINAL  Final  Culture, body fluid w Gram Stain-bottle     Status: None   Collection Time: 10/26/24  1:05 PM   Specimen: Pleura  Result Value Ref Range Status   Specimen Description PLEURAL  Final   Special Requests   Final    BOTTLES DRAWN AEROBIC AND ANAEROBIC Blood Culture adequate volume   Culture   Final    NO GROWTH 5 DAYS Performed at [facility], [address], [place], [place] [address]    Report Status 10/31/2024 FINAL  Final  Culture, blood (Routine X 2) w Reflex to ID Panel     Status: None   Collection Time: 10/28/24  3:23 PM   Specimen: BLOOD   Result Value Ref Range Status   Specimen Description BLOOD BLOOD RIGHT ARM  Final   Special Requests   Final    BOTTLES DRAWN AEROBIC AND ANAEROBIC Blood Culture adequate volume   Culture   Final    NO GROWTH 5 DAYS Performed at [facility], [address] [place] [address], [place], [place] [address]    Report Status 11/02/2024 FINAL  Final  Culture, blood (Routine X 2) w Reflex to ID Panel     Status: None   Collection Time: 10/28/24  3:23 PM   Specimen: BLOOD  Result Value Ref Range Status   Specimen Description BLOOD RIGHT ANTECUBITAL  Final   Special Requests   Final    BOTTLES DRAWN AEROBIC AND ANAEROBIC Blood Culture adequate volume   Culture   Final    NO GROWTH 5 DAYS Performed at [facility], [address] [place], [place] [address]    Report Status 11/02/2024 FINAL  Final     Scheduled Meds:  aspirin  EC  81 mg Oral Q breakfast   Chlorhexidine   Gluconate Cloth  6 each Topical Q0600   cinacalcet   60 mg Oral Q breakfast   heparin   5,000 Units Subcutaneous Q8H   insulin  aspart  0-5 Units Subcutaneous QHS   insulin  aspart  0-9 Units Subcutaneous TID WC   midodrine   20 mg Oral TID WC   sevelamer  carbonate  4,000 mg Oral TID WC   Continuous Infusions:  sodium chloride      anticoagulant sodium citrate      norepinephrine  (LEVOPHED ) Adult infusion 4 mcg/min (11/02/24 0810)   vancomycin  Stopped (11/01/24 0107)    Procedures/Studies: ECHOCARDIOGRAM COMPLETE Result Date: 10/28/2024    ECHOCARDIOGRAM REPORT   Patient Name:   KOLBIE LEPKOWSKI Sr. Date of Exam: 10/27/2024 Medical Rec #:  [id number]         Height:       73.0 in Accession #:    [id number]        Weight:       230.2 lb Date of Birth:  1961-10-28        BSA:          2.284 m Patient Age:    63 years          BP:           96/50 mmHg Patient Gender: M                 HR:           62 bpm. Exam Location:  [facility] Procedure: 2D Echo (Both Spectral and Color Flow Doppler were utilized during            procedure). Indications:     Bacteremia R78.81  History:        Patient has prior history of Echocardiogram examinations, most                 recent 11/09/2023. Cardiomyopathy and CHF, CAD and Previous                 Myocardial Infarction, Arrythmias:Atrial Flutter and LBBB; Risk                 Factors:Diabetes and Hypertension. ESRD on dialysis [facility]).  Sonographer:    Aida Pizza RCS Referring Phys: [id number] CORNELIUS N VAN DAM  Sonographer Comments: Global longitudinal strain was attempted. IMPRESSIONS  1. Left ventricular ejection fraction, by estimation, is 20 to 25%. The left ventricle has severely decreased function. The left ventricle demonstrates global hypokinesis. There is mild left ventricular hypertrophy. Left ventricular diastolic parameters  are consistent with Grade II diastolic dysfunction (pseudonormalization). Elevated left atrial pressure.  2. Right ventricular systolic function is mildly reduced. The right ventricular size is moderately enlarged. There is moderately elevated pulmonary artery systolic pressure.  3. The mitral valve is abnormal. Moderate mitral valve regurgitation. No evidence of mitral stenosis. Moderate mitral annular calcification.  4. The tricuspid valve is abnormal. Tricuspid valve regurgitation is moderate.  5. Severe low flow low gradient aortic stenosis. The aortic valve has an indeterminant number of cusps. There is severe calcifcation of the aortic valve. There is severe thickening of the aortic valve. Aortic valve regurgitation is mild to moderate. Severe aortic valve stenosis. Aortic valve area, by VTI measures 0.77 cm. Aortic valve mean gradient measures 24.0 mmHg. Aortic valve Vmax measures 3.20 m/s.  6. The inferior vena cava is dilated in size with <50% respiratory variability, suggesting right atrial pressure of 15 mmHg. FINDINGS  Left Ventricle: Left ventricular ejection fraction, by estimation, is  20 to 25%. The left ventricle has severely decreased function. The left ventricle  demonstrates global hypokinesis. Strain was performed and the global longitudinal strain is indeterminate. The left ventricular internal cavity size was normal in size. There is mild left ventricular hypertrophy. Left ventricular diastolic parameters are consistent with Grade II diastolic dysfunction (pseudonormalization). Elevated left atrial pressure. Right Ventricle: The right ventricular size is moderately enlarged. Right vetricular wall thickness was not well visualized. Right ventricular systolic function is mildly reduced. There is moderately elevated pulmonary artery systolic pressure. The tricuspid regurgitant velocity is 3.11 m/s, and with an assumed right atrial pressure of 15 mmHg, the estimated right ventricular systolic pressure is 53.7 mmHg. Left Atrium: Left atrial size was normal in size. Right Atrium: Right atrial size was normal in size. Pericardium: There is no evidence of pericardial effusion. Mitral Valve: The MR vena contracta is 0.4 cm. The mitral valve is abnormal. There is moderate thickening of the mitral valve leaflet(s). There is moderate calcification of the mitral valve leaflet(s). Moderate mitral annular calcification. Moderate mitral valve regurgitation. No evidence of mitral valve stenosis. Tricuspid Valve: The tricuspid valve is abnormal. Tricuspid valve regurgitation is moderate . No evidence of tricuspid stenosis. Aortic Valve: Severe low flow low gradient aortic stenosis. The aortic valve has an indeterminant number of cusps. There is severe calcifcation of the aortic valve. There is severe thickening of the aortic valve. There is severe aortic valve annular calcification. Aortic valve regurgitation is mild to moderate. Aortic regurgitation PHT measures 435 msec. Severe aortic stenosis is present. Aortic valve mean gradient measures 24.0 mmHg. Aortic valve peak gradient measures 41.0 mmHg. Aortic valve area,  by VTI measures 0.77 cm. Pulmonic Valve: The pulmonic valve was not  well visualized. Pulmonic valve regurgitation is mild. No evidence of pulmonic stenosis. Aorta: The aortic root is normal in size and structure. Venous: The inferior vena cava is dilated in size with less than 50% respiratory variability, suggesting right atrial pressure of 15 mmHg. IAS/Shunts: No atrial level shunt detected by color flow Doppler. Additional Comments: 3D was performed not requiring image post processing on an independent workstation and was abnormal.  LEFT VENTRICLE PLAX 2D LVIDd:         5.75 cm LVIDs:         5.30 cm LV PW:         1.10 cm LV IVS:        1.10 cm LVOT diam:     2.00 cm   3D Volume EF: LV SV:         49        3D EF:        38 % LV SV Index:   21        LV EDV:       217 ml LVOT Area:     3.14 cm  LV ESV:       134 ml                          LV SV:        83 ml RIGHT VENTRICLE TAPSE (M-mode): 1.4 cm LEFT ATRIUM             Index        RIGHT ATRIUM           Index LA diam:        4.90 cm 2.15 cm/m   RA Area:  18.70 cm LA Vol (A2C):   92.0 ml 40.28 ml/m  RA Volume:   56.00 ml  24.52 ml/m LA Vol (A4C):   70.6 ml 30.91 ml/m LA Biplane Vol: 81.3 ml 35.60 ml/m  AORTIC VALVE AV Area (Vmax):    0.69 cm AV Area (Vmean):   0.64 cm AV Area (VTI):     0.77 cm AV Vmax:           320.00 cm/s AV Vmean:          232.000 cm/s AV VTI:            0.635 m AV Peak Grad:      41.0 mmHg AV Mean Grad:      24.0 mmHg LVOT Vmax:         70.30 cm/s LVOT Vmean:        47.600 cm/s LVOT VTI:          0.155 m LVOT/AV VTI ratio: 0.24 AI PHT:            435 msec  AORTA Ao Root diam: 3.70 cm MITRAL VALVE                  TRICUSPID VALVE MV Area (PHT): 4.60 cm       TR Peak grad:   38.7 mmHg MV Decel Time: 165 msec       TR Vmax:        311.00 cm/s MR Peak grad:    108.2 mmHg MR Mean grad:    64.0 mmHg    SHUNTS MR Vmax:         520.00 cm/s  Systemic VTI:  0.16 m MR Vmean:        370.0 cm/s   Systemic Diam: 2.00 cm MR PISA:         1.90 cm MR PISA Eff ROA: 11 mm MR PISA Radius:  0.55 cm MV E  velocity: 145.00 cm/s MV A velocity: 70.20 cm/s MV E/A ratio:  2.07 Dorn Ross MD Electronically signed by Dorn Ross MD Signature Date/Time: 10/28/2024/2:06:38 PM    Final    US  THORACENTESIS ASP PLEURAL SPACE W/IMG GUIDE Result Date: 10/26/2024 INDICATION: Patient admitted 10/24/2024 with severe sepsis, MRSA bacteremia and new left pleural effusion. Request for diagnostic and therapeutic left thoracentesis. EXAM: ULTRASOUND GUIDED LEFT THORACENTESIS MEDICATIONS: 6 mL 1% lidocaine  COMPLICATIONS: None immediate. PROCEDURE: An ultrasound guided thoracentesis was thoroughly discussed with the patient and questions answered. The benefits, risks, alternatives and complications were also discussed. The patient understands and wishes to proceed with the procedure. Written consent was obtained. Ultrasound was performed to localize and mark an adequate pocket of fluid in the left chest. The area was then prepped and draped in the normal sterile fashion. 1% Lidocaine  was used for local anesthesia. Under ultrasound guidance a 19 gauge, 7-cm, Yueh catheter was introduced. Thoracentesis was performed. The catheter was removed and a dressing applied. FINDINGS: A total of approximately 450 mL of clear yellow fluid was removed. Samples were sent to the laboratory as requested by the clinical team. IMPRESSION: Successful ultrasound guided left thoracentesis yielding 450 mL of pleural fluid. Performed by Clotilda Hesselbach, PA-C Electronically Signed   By: Ester Sides M.D.   On: 10/26/2024 16:13   DG Chest 1 View Result Date: 10/26/2024 EXAM: 1 VIEW(S) XRAY OF THE CHEST 10/26/2024 01:55:07 PM COMPARISON: 2 days ago. CLINICAL HISTORY: [id number] Status post thoracentesis [id number] Status post thoracentesis. FINDINGS: LINES, TUBES AND DEVICES: Stable left internal jugular  dialysis catheter. LUNGS AND PLEURA: New right upper lobe opacity is noted concerning for possible infiltrate. Stable left basilar atelectasis and  possible small pleural effusion is noted. No pneumothorax is noted. HEART AND MEDIASTINUM: Stable cardiomegaly. BONES AND SOFT TISSUES: No acute osseous abnormality. IMPRESSION: 1. New right upper lobe opacity, concerning for possible pneumonia. 2. Stable left basilar atelectasis with possible small pleural effusion. Electronically signed by: Lynwood Seip MD 10/26/2024 02:38 PM EST RP Workstation: [id number]   CT Chest Wo Contrast Result Date: 10/24/2024 CLINICAL DATA:  Pneumonia. Shortness of breath after dialysis Friday. EXAM: CT CHEST WITHOUT CONTRAST TECHNIQUE: Multidetector CT imaging of the chest was performed following the standard protocol without IV contrast. RADIATION DOSE REDUCTION: This exam was performed according to the departmental dose-optimization program which includes automated exposure control, adjustment of the mA and/or kV according to patient size and/or use of iterative reconstruction technique. COMPARISON:  11/08/2023. FINDINGS: Cardiovascular: Atherosclerotic calcification of the aorta, aortic valve and coronary arteries. Enlarged pulmonic trunk and heart. No pericardial effusion. Left IJ dialysis catheter terminates in the right atrium. Mediastinum/Nodes: No pathologically enlarged mediastinal or axillary lymph nodes. Hilar regions are difficult to definitively evaluate without IV contrast. Esophagus is grossly unremarkable. Lungs/Pleura: Image quality is degraded by expiratory phase imaging and respiratory motion. Peribronchovascular ground-glass and consolidation in the anterior segment right upper lobe and likely elsewhere within the right middle and right lower lobes. Tiny right and moderate left pleural effusion, the latter of which is partially loculated. Compressive collapse/consolidation in the left lower lobe and to a lesser extent, lingula. There may be tracheobronchomalacia. Upper Abdomen: Liver margin is slightly irregular. Bilateral renal stones and/or renal vascular  calcifications. Visualized portions of the liver, gallbladder, adrenal glands, kidneys, spleen, pancreas, stomach and bowel are otherwise grossly unremarkable. No upper abdominal adenopathy. Slight nonspecific haziness in the left upper quadrant omentum. Musculoskeletal: Degenerative changes in the spine. Old sternal fracture. IMPRESSION: 1. Multilobar bronchopneumonia. 2. Tiny right and moderate partially loculated left pleural effusions. Collapse/consolidation in the left lower lobe may be due to atelectasis. Difficult to exclude pneumonia. 3. 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 4. Liver margin is slightly irregular, raising suspicion for cirrhosis. 5. Bilateral renal stones and/or renal vascular calcifications. 6. Aortic atherosclerosis (ICD10-I70.0). Coronary artery calcification. 7. Enlarged pulmonic trunk, indicative of pulmonary arterial hypertension. Electronically Signed   By: Newell Eke M.D.   On: 10/24/2024 14:15   DG Chest Port 1 View Result Date: 10/24/2024 CLINICAL DATA:  Shortness of breath. EXAM: PORTABLE CHEST 1 VIEW COMPARISON:  10/16/2024 FINDINGS: Low volume film. The cardio pericardial silhouette is enlarged. Retrocardiac collapse/consolidation with effusion is similar to prior. No pulmonary edema. Left IJ central line tip overlies the mid SVC level. IMPRESSION: Low volume film with persistent retrocardiac collapse/consolidation and effusion. Electronically Signed   By: Camellia Candle M.D.   On: 10/24/2024 11:31   DG Chest Port 1 View Result Date: 10/16/2024 EXAM: 1 VIEW(S) XRAY OF THE CHEST 10/16/2024 12:11:00 PM COMPARISON: 01/13/2024 CLINICAL HISTORY: sob FINDINGS: LINES, TUBES AND DEVICES: Tunneled left IJ hemodialysis catheter with tip at superior cavoatrial junction. LUNGS AND PLEURA: Low lung volumes. New left retrocardiac airspace opacity. Possible small left pleural effusion. No pneumothorax. HEART AND MEDIASTINUM: The cardiac silhouette is unremarkable. Aortic atherosclerosis is  present. BONES AND SOFT TISSUES: No acute osseous abnormality. IMPRESSION: 1. New left retrocardiac airspace opacity and possible small left pleural effusion. 2. Low lung volumes. Electronically signed by: Evalene Coho MD 10/16/2024 12:16 PM EST RP Workstation: [id number]   CRITICAL  CARE Performed by: Eric Nunnery   Total critical care time: 50 minutes  Critical care time was exclusive of separately billable procedures and treating other patients.  Critical care was necessary to treat or prevent imminent or life-threatening deterioration.  Critical care was time spent personally by me on the following activities: development of treatment plan with patient and/or surrogate as well as nursing, discussions with consultants, evaluation of patient's response to treatment, examination of patient, obtaining history from patient or surrogate, ordering and performing treatments and interventions, ordering and review of laboratory studies, ordering and review of radiographic studies, pulse oximetry and re-evaluation of patient's condition.   Eric Nunnery, MD  [facility]  If 7PM-7AM, please contact night-coverage [email or web address] Password [email or web address] 11/02/2024, 2:26 PM   LOS: 9 days

## 2024-11-02 NOTE — Inpatient Diabetes Management (Signed)
 Inpatient Diabetes Program Recommendations  AACE/ADA: New Consensus Statement on Inpatient Glycemic Control (2015)  Target Ranges:  Prepandial:   less than 140 mg/dL      Peak postprandial:   less than 180 mg/dL (1-2 hours)      Critically ill patients:  140 - 180 mg/dL   Lab Results  Component Value Date   GLUCAP 182 (H) 11/02/2024   HGBA1C 8.6 (H) 10/24/2024    Review of Glycemic Control  Latest Reference Range & Units 11/01/24 07:35 11/01/24 11:37 11/01/24 16:34 11/01/24 22:03 11/02/24 04:14 11/02/24 07:38 11/02/24 12:12  Glucose-Capillary 70 - 99 mg/dL 763 (H) 726 (H) 838 (H) 269 (H) 316 (H) 364 (H) 182 (H)   Diabetes history: DM2 Outpatient Diabetes medications: Novolin  70/30 30 in am and 25 QPM Current orders for Inpatient glycemic control: Novolog  0-9 units TID + HS  HgbA1C - 8.6%  Inpatient Diabetes Program Recommendations:    -   Consider adding Semglee  8-10 units at bedtime   Thanks,  Clotilda Bull RN, MSN, BC-ADM Inpatient Diabetes Coordinator Team Pager [phone] (8a-5p)

## 2024-11-02 NOTE — H&P (Signed)
 NAME:  [name] [name]., MRN:  [id number], DOB:  09-07-1961, LOS: 9 ADMISSION DATE:  10/24/2024, CONSULTATION DATE:  11/02/24 REFERRING MD:  Ricky - [facility] CHIEF COMPLAINT:  11/02/24   History of Present Illness:  [name] DIODATO Sr. is a 63 y.o. male who has a PMH as outlined below including but not limited to ESRD on HD MWF, A-fib, systolic CHF, hypertension, LBBB, DM.  He presented to [facility] ED on 10/24/2024 with dyspnea x roughly 2-3 weeks. He had been seen in the ED a few days early on 12/2 for dysuria and was started on a course of Cipro  which she started the following day and stated that he had been taking as prescribed.    CT chest that demonstrated multifocal pneumonia and small effusions.  Blood cultures ultimately grew MRSA.  He had been on vancomycin  which was continued. He had a left thora 12/10 with 450cc fluid removed.  His HD catheter was removed on 10/28/2024 as well as an arterial line.  He did have a TTE on 12/11 that showed an EF of 20-25%, G2 DD, mild decrease in RV SF, severe AAS, no vegetations.  TEE was recommended but had been deferred due to patient requiring vasopressor support.  ID was consulted and recommended line holiday.  He had a temporary femoral HD cath placed on 12/15.  ID recommendations included 6 weeks of vancomycin  to end on 12/09/2024.   12/17, he was transferred to [facility] for ongoing management for septic shock as well as for IR evaluation for tunneled HD catheter placement.  Pertinent  Medical History:  has ESRD on dialysis (HCC); DM2 (diabetes mellitus, type 2) (HCC); HTN (hypertension); Dialysis AV fistula malfunction; LBBB (left bundle branch block); Problem with vascular access; Subacute osteomyelitis, right ankle and foot (HCC); Protein-calorie malnutrition, mild; GERD (gastroesophageal reflux disease); Hyperkalemia; MRSA bacteremia; History of CVA ; Carotid stenosis; Hypotension; Obesity (BMI 30-39.9); Below-knee amputation of right lower extremity  (HCC); CAD (coronary artery disease); Leukocytosis; Cellulitis of right leg; Pressure injury of skin; Dehiscence of amputation stump of right lower extremity (HCC); Abscess of right lower leg; Diarrhea; Hyperosmolar hyperglycemic state (HHS) (HCC); Cholelithiasis; Hyperbilirubinemia; RUQ abdominal pain; Hyperglycemia; Atrial flutter with rapid ventricular response (HCC); Aortic valve stenosis; Heme positive stool; Ischemic colitis; Bloody diarrhea; Iron  deficiency anemia; Orthostatic hypotension; Uncontrolled diabetes mellitus with hyperglycemia (HCC); Anemia in chronic kidney disease; Gout, unspecified; Late effects of cerebrovascular disease; Hypothyroidism, unspecified; Nonischemic cardiomyopathy (HCC); Repeated falls; Type 2 diabetes mellitus with diabetic chronic kidney disease (HCC); Chronic systolic CHF (congestive heart failure) (HCC); Cellulitis of left lower limb; Insulin  dependent type 2 diabetes mellitus (HCC); Essential hypertension; Peripheral vascular disease, unspecified; Disorder of bilirubin metabolism, unspecified; Old myocardial infarction; Dependence on renal dialysis; Vasculogenic erectile dysfunction; Secondary hyperparathyroidism of renal origin; Gross hematuria; Multifocal pneumonia; Severe sepsis with septic shock; Sepsis due to methicillin resistant Staphylococcus aureus (MRSA) (HCC); Pleural effusion; End stage renal disease (HCC); and Hemodialysis catheter infection on their problem list.  Significant Hospital Events: Including procedures, antibiotic start and stop dates in addition to other pertinent events   12/8 admit 12/10 L thora with 450cc removed, transudate, neg cultures 12/11 echo 12/12 HD cath and art line removed 12/15 new femoral HD cath placed 12/17 transfer to [facility]  Interim History / Subjective:  Comfortable, eating dinner. On 4 NE.  Objective:  Blood pressure (!) 113/58, pulse 90, temperature 98.4 F (36.9 C), temperature source Oral, resp. rate (!) 26,  height 6' 1 (1.854 m),  weight 110.5 kg, SpO2 95%.        Intake/Output Summary (Last 24 hours) at 11/02/2024 1936 Last data filed at 11/02/2024 1900 Gross per 24 hour  Intake 902.33 ml  Output 1500 ml  Net -597.67 ml   Filed Weights   10/28/24 1220 11/02/24 1003 11/02/24 1340  Weight: 105.3 kg 112 kg 110.5 kg     Physical Exam: General: Adult male, chronically ill appearing, resting in bed, in NAD. Neuro: A&O x 3, no deficits. HEENT: [place]/AT. Sclerae anicteric. EOMI. Cardiovascular: RRR, 3/6 SEM.  Lungs: Respirations even and unlabored.  CTA bilaterally, No W/R/R. Abdomen: BS x 4, soft, NT/ND.  Musculoskeletal: R BKA, no edema.   Labs/imaging personally reviewed:  CT chest 12/8 > multifocal pneumonia, small right and moderate left partially loculated effusion, suspected tracheobronchomalacia, possible cirrhosis, bilateral renal stones.  Assessment & Plan:   Septic shock - 2/2 MRSA  bacteremia (BCx from 12/10) and multifocal pneumonia. Repeat blood cultures 12/12 negative. L pleural effusion - s/p thora 12/10, transudative with negative cultures. - Continue vancomycin , stop date 12/09/2024 (6 weeks total therapy).   - Day team to please notify ID transferred to [facility] (though, doubt anything new for now). - Continue norepinephrine , goal MAP > 65. - Continue midodrine  20 mg 3 times daily. - Will ultimately need TEE at some point. - Reassess CXR intermittently.  ESRD on HD MWF. Hyponatremia - chronic, presumed exacerbated by hypervolemia (weight gain of 13 lbs since admit noted; though question accuracy as nephrology notes indicate only 2kg up from EDW). - Day team to please consult nephrology. - Volume removal per nephro. Depending on his pressor needs, might need to consider CRRT. - Day team to please consult IR for assistance with tunneled HD catheter placement (order placed). - F/u BMP.  Hx chronic hypotension, combined CHF (echo from 12/11 with EF of 20-25%, G2 DD,  mild decrease in RV SF, severe AAS, no vegetations). - Continue midodrine  3 times daily. - Continue norepinephrine  as needed as above in setting of septic shock. - Will need TEE at some point. - GDMT prohibited by septic shock.  Hx PVD - s/p R BKA. - Continue daily ASA.  Hx DM. - SSI. - Add 2u TID with meals given ongoing hyperglycemia.   Labs   CBC: Recent Labs  Lab 10/28/24 0749 10/29/24 0343 11/02/24 0439  WBC 9.1 10.2 9.3  HGB 12.0* 12.4* 12.0*  HCT 36.2* 38.0* 36.7*  MCV 73.9* 73.8* 75.7*  PLT 208 219 388    Basic Metabolic Panel: Recent Labs  Lab 10/28/24 0749 10/29/24 0343 11/02/24 0439  NA 130* 130* 126*  K 3.9 3.9 4.7  CL 90* 91* 90*  CO2 25 25 18*  GLUCOSE 234* 270* 326*  BUN 59* 38* 53*  CREATININE 10.90* 7.71* 8.24*  CALCIUM  7.8* 8.3* 7.8*  MG  --  2.4  --   PHOS 5.5*  --   --    GFR: Estimated Creatinine Clearance: 12.1 mL/min (A) (by C-G formula based on SCr of 8.24 mg/dL (H)). Recent Labs  Lab 10/28/24 0749 10/29/24 0343 11/02/24 0439  WBC 9.1 10.2 9.3    Liver Function Tests: Recent Labs  Lab 10/28/24 0749 10/29/24 0343 11/02/24 0439  AST  --  31 66*  ALT  --  11 36  ALKPHOS  --  177* 353*  BILITOT  --  1.4* 1.3*  PROT  --  7.4 8.0  ALBUMIN  2.8* 2.8* 3.1*   No results for input(s):  LIPASE, AMYLASE in the last 168 hours. No results for input(s): AMMONIA in the last 168 hours.  ABG    Component Value Date/Time   PHART 7.42 11/11/2023 1225   PCO2ART 36 11/11/2023 1225   PO2ART 59 (L) 11/11/2023 1225   HCO3 33.0 (H) 02/11/2024 1506   TCO2 27 03/30/2024 0757   ACIDBASEDEF 0.6 11/11/2023 1225   O2SAT 32.3 02/11/2024 1506     Coagulation Profile: No results for input(s): INR, PROTIME in the last 168 hours.  Cardiac Enzymes: No results for input(s): CKTOTAL, CKMB, CKMBINDEX, TROPONINI in the last 168 hours.  HbA1C: Hgb A1c MFr Bld  Date/Time Value Ref Range Status  10/24/2024 10:14 AM 8.6 (H) 4.8 -  5.6 % Final    Comment:    (NOTE) Diagnosis of Diabetes The following HbA1c ranges recommended by the American Diabetes Association (ADA) may be used as an aid in the diagnosis of diabetes mellitus.  Hemoglobin             Suggested A1C NGSP%              Diagnosis  <5.7                   Non Diabetic  5.7-6.4                Pre-Diabetic  >6.4                   Diabetic  <7.0                   Glycemic control for                       adults with diabetes.    02/11/2024 02:27 PM 8.4 (H) 4.8 - 5.6 % Final    Comment:    (NOTE) Pre diabetes:          5.7%-6.4%  Diabetes:              >6.4%  Glycemic control for   <7.0% adults with diabetes     CBG: Recent Labs  Lab 11/01/24 2203 11/02/24 0414 11/02/24 0738 11/02/24 1212 11/02/24 1657  GLUCAP 269* 316* 364* 182* 204*    Review of Systems:   All negative; except for those that are bolded, which indicate positives.  Constitutional: weight loss, weight gain, night sweats, fevers, chills, fatigue, weakness.  HEENT: headaches, sore throat, sneezing, nasal congestion, post nasal drip, difficulty swallowing, tooth/dental problems, visual complaints, visual changes, ear aches. Neuro: difficulty with speech, weakness, numbness, ataxia. CV:  chest pain, orthopnea, PND, swelling in lower extremities, dizziness, palpitations, syncope.  Resp: cough, hemoptysis, dyspnea, wheezing. GI: heartburn, indigestion, abdominal pain, nausea, vomiting, diarrhea, constipation, change in bowel habits, loss of appetite, hematemesis, melena, hematochezia.  GU: dysuria, change in color of urine, urgency or frequency, flank pain, hematuria. MSK: joint pain or swelling, decreased range of motion. Psych: change in mood or affect, depression, anxiety, suicidal ideations, homicidal ideations. Skin: rash, itching, bruising.   Past Medical History:  He,  has a past medical history of ESRD on hemodialysis (HCC), Heart failure with mid-range  ejection fraction (HCC), Hypertension, Nonischemic cardiomyopathy (HCC), Nonobstructive coronary artery disease, Obesity, Peripheral arterial disease, Peripheral vascular disease, and Type 2 diabetes mellitus (HCC).   Surgical History:   Past Surgical History:  Procedure Laterality Date   A/V FISTULAGRAM N/A 08/15/2019   Procedure: A/V FISTULAGRAM;  Surgeon: Sheree Penne Bruckner, MD;  [facility] IN[facility]AS[facility] LAB;  Service: Cardiovascular;  Laterality: N/A;   A/V FISTULAGRAM N/A 01/21/2024   Procedure: A/V Fistulagram;  Surgeon: Melia Lynwood ORN, MD;  Location: [facility] INVASIVE CV LAB;  Service: Cardiovascular;  Laterality: N/A;   AMPUTATION Right 02/02/2022   Procedure: AMPUTATION 5th RAY;  Surgeon: Joya Stabs, MD;  Location: [facility] OR;  Service: Podiatry;  Laterality: Right;   AMPUTATION Right 05/23/2022   Procedure: RIGHT BELOW KNEE AMPUTATION;  Surgeon: Harden Jerona GAILS, MD;  Location: [facility] OR;  Service: Orthopedics;  Laterality: Right;   AMPUTATION Right 07/04/2022   Procedure: REVISION AMPUTATION BELOW KNEE;  Surgeon: Harden Jerona GAILS, MD;  Location: [facility] OR;  Service: Orthopedics;  Laterality: Right;   APPLICATION OF WOUND VAC  05/23/2022   Procedure: APPLICATION OF WOUND VAC;  Surgeon: Harden Jerona GAILS, MD;  Location: [facility] OR;  Service: Orthopedics;;   AV FISTULA PLACEMENT     CARDIAC CATHETERIZATION  02/17/2018   LHC 02/17/18 ([name]): 30% pLAD, 30% dRCA, LVEDP 25-30, LVEF 25%.     CATARACT EXTRACTION W/PHACO Right 03/29/2020   Procedure: CATARACT EXTRACTION PHACO AND INTRAOCULAR LENS PLACEMENT (IOC)  RIGHT DIABETIC;  Surgeon: Mittie Gaskin, MD;  Location: [facility] ORS;  Service: Ophthalmology;  Laterality: Right;  Lot # [id number] H US : 02:30.9 AP% 9.5% CDE: 14.38   EYE SURGERY Right    cataract removed   [name] N/A 12/02/2023   Procedure: [name];  Surgeon: Federico Rosario BROCKS, MD;  Location: [facility] ENDOSCOPY;  Service: Gastroenterology;  Laterality: N/A;   I & D EXTREMITY  Right 02/02/2022   Procedure: IRRIGATION AND DEBRIDEMENT RIGHT FOOT;  Surgeon: Joya Stabs, MD;  Location: [facility] OR;  Service: Podiatry;  Laterality: Right;   INCISION AND DRAINAGE OF WOUND Right 02/08/2022   Procedure: IRRIGATION AND DEBRIDEMENT WOUND;  Surgeon: Burt Fus, DPM;  Location: [facility] OR;  Service: Podiatry;  Laterality: Right;  with removal of infected bone    INSERTION OF DIALYSIS CATHETER  08/13/2019   Procedure: Insertion Of Dialysis Catheter;  Surgeon: Oris Krystal FALCON, MD;  Location: [facility] OR;  Service: Vascular;;   INSERTION OF DIALYSIS CATHETER Left 07/19/2020   Procedure: INSERTION OF DIALYSIS CATHETER;  Surgeon: Sheree Penne Bruckner, MD;  Location: [facility] OR;  Service: Vascular;  Laterality: Left;   IR FLUORO GUIDE CV LINE LEFT  02/16/2024   IR LUMBAR PUNCTURE  11/20/2023   IR US  GUIDE BX ASP/DRAIN  11/07/2022   IR US  GUIDE VASC ACCESS LEFT  02/16/2024   IRRIGATION AND DEBRIDEMENT ABSCESS     PERIPHERAL VASCULAR BALLOON ANGIOPLASTY  08/15/2019   Procedure: PERIPHERAL VASCULAR BALLOON ANGIOPLASTY;  Surgeon: Sheree Penne Bruckner, MD;  Location: [facility] INVASIVE CV LAB;  Service: Cardiovascular;;   PERIPHERAL VASCULAR BALLOON ANGIOPLASTY Left 02/06/2022   Procedure: PERIPHERAL VASCULAR BALLOON ANGIOPLASTY;  Surgeon: Gretta Bruckner PARAS, MD;  [facility];  Service: Cardiovascular;  Laterality: Left;   PERIPHERAL VASCULAR BALLOON ANGIOPLASTY  01/21/2024   Procedure: PERIPHERAL VASCULAR BALLOON ANGIOPLASTY;  Surgeon: Melia Lynwood ORN, MD;  [facility];  Service: Cardiovascular;;  Cephalic Arch   RADIOLOGY WITH ANESTHESIA N/A 11/20/2023   Procedure: MRI WITH ANESTHESIA;  Surgeon: Radiologist, Medication, MD;  Location: [facility] OR;  Service: Radiology;  Laterality: N/A;   RADIOLOGY WITH ANESTHESIA N/A 11/20/2023   Procedure: IR WITH ANESTHESIA;  Surgeon: Alona Corners, DO;  Location: [facility] OR;  Service: Anesthesiology;  Laterality: N/A;  PLEASE OBTAIN OPENING PRESSURE AND DRAIN  AT LEAST 30CC'S OF CSF.  REVISON OF ARTERIOVENOUS FISTULA Left 07/19/2020   Procedure: REVISON/PLICATION OF ARTERIOVENOUS FISTULA LEFT;  Surgeon: Sheree Penne Bruckner, MD;  Location: [facility] OR;  Service: Vascular;  Laterality: Left;   THROMBECTOMY AND REVISION OF ARTERIOVENTOUS (AV) GORETEX  GRAFT Left 08/13/2019   Procedure: THROMBECTOMY OF LEFT ARM ARTERIOVENTOUS (AV) FISTULA;  Surgeon: Oris Krystal FALCON, MD;  Location: [facility] OR;  Service: Vascular;  Laterality: Left;   TOE AMPUTATION     UPPER EXTREMITY VENOGRAPHY Bilateral 03/30/2024   Procedure: UPPER EXTREMITY VENOGRAPHY;  Surgeon: Lanis Fonda BRAVO, MD;  Location: [facility] INVASIVE CV LAB;  Service: Cardiovascular;  Laterality: Bilateral;     Social History:   reports that he has never smoked. He has never used smokeless tobacco. He reports current alcohol use. He reports that he does not use drugs.   Family History:  His family history includes Hypertension in his mother.   Allergies Allergies[1]   Home Medications  Prior to Admission medications  Medication Sig Start Date End Date Taking? Authorizing Provider  acetaminophen  (TYLENOL ) 500 MG tablet Take 1,000 mg by mouth 2 (two) times daily as needed for moderate pain (pain score 4-6), headache or fever.   Yes [provider]  aspirin  EC 81 MG tablet Take 1 tablet (81 mg total) by mouth daily with breakfast. Swallow whole. 02/17/24  Yes Emokpae, Courage, MD  cinacalcet  (SENSIPAR ) 30 MG tablet Take 30 mg by mouth daily.   Yes [provider]  ciprofloxacin  (CIPRO ) 500 MG tablet Take 1 tablet (500 mg total) by mouth 2 (two) times daily. One po bid x 7 days 10/18/24  Yes Delo, Vicenta, MD  doxycycline  (VIBRAMYCIN ) 100 MG capsule Take 1 capsule (100 mg total) by mouth 2 (two) times daily. One po bid x 7 days 10/16/24  Yes Zammit, Joseph, MD  gabapentin  (NEURONTIN ) 300 MG capsule Take 300 mg by mouth daily.   Yes [provider]  insulin  isophane & regular human KwikPen  (NOVOLIN  70/30 KWIKPEN) (70-30) 100 UNIT/ML KwikPen Inject 30U in the morning and 25U in the evening Patient taking differently: Inject 25-30 Units into the skin See admin instructions. Inject 30 Units in the morning and 25 Units in the evening 10/27/23  Yes Ezenduka, Nkeiruka J, MD  midodrine  (PROAMATINE ) 10 MG tablet Take 10 mg by mouth 3 (three) times daily. 06/10/24  Yes [provider]  vancomycin  IVPB Inject 1,000 mg into the vein every Monday, Wednesday, and Friday with hemodialysis. Indication:  MRSA Bacteremia Last Day of Therapy:  12/09/24 Labs - Once weekly: Vanc [place], CBC 10/31/24 12/09/24 Yes Vu, Constance DASEN, MD  Insulin  Pen Needle 32G X 4 MM MISC Use with insulin  pen. 10/28/23   Ezenduka, Nkeiruka J, MD  pantoprazole  (PROTONIX ) 40 MG tablet Take 1 tablet (40 mg total) by mouth 2 (two) times daily. Patient not taking: Reported on 10/24/2024 01/13/24 06/07/24  Verdene Purchase, MD     Critical care time: 30 min.   Sammi Gore, PA - [facility] For pager details, please see AMION or use Epic chat  After 1900, please call [facility] for cross coverage needs 11/02/2024, 7:36 PM       [1]  Allergies Allergen Reactions   Sulfa Antibiotics Diarrhea and Nausea And Vomiting

## 2024-11-02 NOTE — Procedures (Signed)
 I was present at this dialysis session. I have reviewed the session itself and made appropriate changes.  Restarted on levophed  yesterday after his SBP dropped into the 50's.  He will be transferred to 48M at [facility] and will need IR to replace TDC.  Vital signs in last 24 hours:  Temp:  [97.8 F (36.6 C)-98.4 F (36.9 C)] 98.4 F (36.9 C) (12/17 1000) Pulse Rate:  [70-101] 80 (12/17 1000) Resp:  [0-29] 23 (12/17 1000) BP: (59-176)/(10-146) 123/70 (12/17 1005) SpO2:  [91 %-100 %] 96 % (12/17 1000) Weight:  [887 kg] 112 kg (12/17 1003) Weight change:  Filed Weights   10/28/24 0738 10/28/24 1220 11/02/24 1003  Weight: 107.2 kg 105.3 kg 112 kg    Recent Labs  Lab 10/28/24 0749 10/29/24 0343 11/02/24 0439  NA 130*   < > 126*  K 3.9   < > 4.7  CL 90*   < > 90*  CO2 25   < > 18*  GLUCOSE 234*   < > 326*  BUN 59*   < > 53*  CREATININE 10.90*   < > 8.24*  CALCIUM  7.8*   < > 7.8*  PHOS 5.5*  --   --    < > = values in this interval not displayed.    Recent Labs  Lab 10/28/24 0749 10/29/24 0343 11/02/24 0439  WBC 9.1 10.2 9.3  HGB 12.0* 12.4* 12.0*  HCT 36.2* 38.0* 36.7*  MCV 73.9* 73.8* 75.7*  PLT 208 219 388    Scheduled Meds:  aspirin  EC  81 mg Oral Q breakfast   Chlorhexidine  Gluconate Cloth  6 each Topical Q0600   cinacalcet   60 mg Oral Q breakfast   heparin   5,000 Units Subcutaneous Q8H   insulin  aspart  0-5 Units Subcutaneous QHS   insulin  aspart  0-9 Units Subcutaneous TID WC   midodrine   20 mg Oral TID WC   sevelamer  carbonate  4,000 mg Oral TID WC   Continuous Infusions:  sodium chloride      anticoagulant sodium citrate      norepinephrine  (LEVOPHED ) Adult infusion 4 mcg/min (11/02/24 0810)   vancomycin  Stopped (11/01/24 0107)   PRN Meds:.Place/Maintain arterial line **AND** sodium chloride , acetaminophen  **OR** acetaminophen , alteplase , anticoagulant sodium citrate , guaiFENesin -dextromethorphan , heparin , heparin , heparin , ipratropium-albuterol , lidocaine   (PF), lidocaine -prilocaine , ondansetron  **OR** ondansetron  (ZOFRAN ) IV, mouth rinse, pentafluoroprop-tetrafluoroeth, polyethylene glycol    Dialysis prescription: [facility], MWF, 180 dialyzer, 4 hours 15 minutes, BFR 400/DFR 800, EDW 101.9 kg, 2K/2.0 calcium , UF profile #4, heparin  6000 unit bolus.  Left IJ TDC.  Hectorol  5 mcg IV q. HD.   On Sensipar  60 mg daily, midodrine  20 mg 3 times daily with option to take extra 20 mg mid to dialysis, Renvela  4000 mg 3 times daily AC.   Assessment/Plan: 1.  Multifocal pneumonia and MRSA bacteremia: Negative testing for RSV infection and COVID and started on presumptive treatment for pneumonia based on CT scan findings; on vancomycin  and cefepime  -> Vanc.  Concern raised with 4 out of 4 positive blood cultures with gram-positive cocci.  Awaiting identification/sensitivities; on Vanc, ID onboard as well.   2.  End-stage renal disease: Typically on MWF dialysis schedule and dialysis postponed to Tues (1L net UF). Attempting to wean down Levophed ; his typical dose of midodrine  20 mg 3 times daily.  He does not appear to be hypervolemic on exam or upon review of his imaging but is 2kg up on EDW.     Per ID recs needs at  least 48hrs line free, currently still has IV's on right, art line, LIJ TC. Appreciate Dr. Evonnie removed the LIJ TC on 10/28/24.  Had temporary left femoral HD catheter placed by Dr. Kallie on 10/31/24. See below.    Discussed with the dialysis nurse and she will come in a little bit early Friday and has the patient on for first shift.   3.  Hypotension: Acute on chronic with evidence of sepsis in the setting of infection/bacteremia.  Resumed oral midodrine  dose 20 mg 3 times a day and weaned off of Levophed  drip but resumed last night due to SBP's in the 50's. Totally asymptomatic.  To transfer to [facility] ICU today after HD.   4.  Anemia: Hemoglobin and hematocrit currently at acceptable range, no indication for ESA.   5.   Secondary hyperparathyroidism: Calcium  level acceptable with significant hyperphosphatemia-resume sevelamer  for phosphorus binding and Sensipar  for PTH control.  Hold Hectorol  for now.   6. Vascular access - had LIJ TDC removed last week for line holiday.  Will need new TDC placement.  IR refused transport to [facility] due to low bp.  Dr. Evonnie discussed case with me and Dr. Kallie who has kindly agreed to place temp left femoral HD catheter.  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 Hopefully can get TDC placed by IR this week.    Fairy DELENA Sellar,  MD 11/02/2024, 10:43 AM

## 2024-11-02 NOTE — Progress Notes (Signed)
 Called [facility] about transporting patient. Dialysis nurse at bedside was about to begin HD. [facility] said to go ahead and dialyze the patient and give them a call back when he is about to be done. Charge nurse at [facility] where he will be going also aware.

## 2024-11-02 NOTE — Progress Notes (Signed)
 The patient has completed the HD treatment and goal met.  11/02/24 1335  Vitals  Temp 98.4 F (36.9 C)  Temp Source Oral  BP 125/60  BP Location Right Wrist  BP Method Automatic  Patient Position (if appropriate) Lying  Oxygen Therapy  O2 Device Room Air  During Treatment Monitoring  Intra-Hemodialysis Comments Tx completed  Post Treatment  Dialyzer Clearance Lightly streaked  Hemodialysis Intake (mL) 0 mL  Liters Processed 72  Fluid Removed (mL) 1500 mL  Tolerated HD Treatment Yes  Post-Hemodialysis Comments see notes.  Hemodialysis Catheter Left Femoral vein Triple lumen Temporary (Non-Tunneled)  Placement Date/Time: 10/31/24 1250   Time Out: Correct patient;Correct site;Correct procedure  Maximum sterile barrier precautions: Hand hygiene;Cap;Mask;Sterile gown;Sterile gloves;Large sterile sheet;Sterile probe cover  Site Prep: Chlorhexidine  (pr...  Site Condition No complications  Blue Lumen Status Heparin  locked  Red Lumen Status Heparin  locked  Catheter fill solution Heparin  1000 units/ml  Catheter fill volume (Arterial) 1.4 cc  Catheter fill volume (Venous) 1.4  Dressing Type Transparent  Dressing Status Antimicrobial disc/dressing in place  Interventions New dressing  Drainage Description None  Dressing Change Due 11/07/24  Post treatment catheter status Capped and Clamped

## 2024-11-02 NOTE — Plan of Care (Signed)
  Problem: Clinical Measurements: Goal: Ability to maintain clinical measurements within normal limits will improve Outcome: Progressing Goal: Will remain free from infection Outcome: Progressing Goal: Diagnostic test results will improve Outcome: Progressing Goal: Respiratory complications will improve Outcome: Progressing Goal: Cardiovascular complication will be avoided Outcome: Not Progressing

## 2024-11-02 NOTE — Progress Notes (Incomplete)
 HD OPAT

## 2024-11-02 NOTE — Progress Notes (Signed)
 Date and time results received: 11/02/2024 0604   Test: Vancomycin  Tr Critical Value: 25  Name of Provider Notified: Adefeso  Orders Received? Or Actions Taken?: Overnight coverage made aware.   K[place]ogg RN

## 2024-11-03 ENCOUNTER — Inpatient Hospital Stay (HOSPITAL_COMMUNITY)

## 2024-11-03 DIAGNOSIS — Z992 Dependence on renal dialysis: Secondary | ICD-10-CM | POA: Diagnosis not present

## 2024-11-03 DIAGNOSIS — E119 Type 2 diabetes mellitus without complications: Secondary | ICD-10-CM

## 2024-11-03 DIAGNOSIS — N186 End stage renal disease: Secondary | ICD-10-CM | POA: Diagnosis not present

## 2024-11-03 DIAGNOSIS — R7881 Bacteremia: Secondary | ICD-10-CM | POA: Diagnosis not present

## 2024-11-03 DIAGNOSIS — J188 Other pneumonia, unspecified organism: Secondary | ICD-10-CM | POA: Diagnosis not present

## 2024-11-03 DIAGNOSIS — E871 Hypo-osmolality and hyponatremia: Secondary | ICD-10-CM | POA: Diagnosis not present

## 2024-11-03 DIAGNOSIS — D631 Anemia in chronic kidney disease: Secondary | ICD-10-CM

## 2024-11-03 DIAGNOSIS — B9562 Methicillin resistant Staphylococcus aureus infection as the cause of diseases classified elsewhere: Secondary | ICD-10-CM | POA: Diagnosis not present

## 2024-11-03 LAB — CORTISOL-AM, BLOOD: Cortisol - AM: 14.8 ug/dL (ref 6.7–22.6)

## 2024-11-03 LAB — COOXEMETRY PANEL
Carboxyhemoglobin: 1.9 % — ABNORMAL HIGH (ref 0.5–1.5)
Methemoglobin: 0.7 % (ref 0.0–1.5)
O2 Saturation: 63.1 %
Total hemoglobin: 11.9 g/dL — ABNORMAL LOW (ref 12.0–16.0)

## 2024-11-03 LAB — TSH: TSH: 4.02 u[IU]/mL (ref 0.350–4.500)

## 2024-11-03 LAB — BASIC METABOLIC PANEL WITH GFR
Anion gap: 13 (ref 5–15)
BUN: 40 mg/dL — ABNORMAL HIGH (ref 8–23)
CO2: 24 mmol/L (ref 22–32)
Calcium: 7.9 mg/dL — ABNORMAL LOW (ref 8.9–10.3)
Chloride: 88 mmol/L — ABNORMAL LOW (ref 98–111)
Creatinine, Ser: 7.02 mg/dL — ABNORMAL HIGH (ref 0.61–1.24)
GFR, Estimated: 8 mL/min — ABNORMAL LOW (ref >60)
Glucose, Bld: 272 mg/dL — ABNORMAL HIGH (ref 70–99)
Potassium: 4.4 mmol/L (ref 3.5–5.1)
Sodium: 125 mmol/L — ABNORMAL LOW (ref 135–145)

## 2024-11-03 LAB — MAGNESIUM: Magnesium: 1.9 mg/dL (ref 1.7–2.4)

## 2024-11-03 LAB — CBC
HCT: 36.2 % — ABNORMAL LOW (ref 39.0–52.0)
Hemoglobin: 11.8 g/dL — ABNORMAL LOW (ref 13.0–17.0)
MCH: 24.6 pg — ABNORMAL LOW (ref 26.0–34.0)
MCHC: 32.6 g/dL (ref 30.0–36.0)
MCV: 75.4 fL — ABNORMAL LOW (ref 80.0–100.0)
Platelets: 380 K/uL (ref 150–400)
RBC: 4.8 MIL/uL (ref 4.22–5.81)
RDW: 25 % — ABNORMAL HIGH (ref 11.5–15.5)
WBC: 9.7 K/uL (ref 4.0–10.5)
nRBC: 0 % (ref 0.0–0.2)

## 2024-11-03 LAB — GLUCOSE, CAPILLARY
Glucose-Capillary: 254 mg/dL — ABNORMAL HIGH (ref 70–99)
Glucose-Capillary: 281 mg/dL — ABNORMAL HIGH (ref 70–99)
Glucose-Capillary: 258 mg/dL — ABNORMAL HIGH (ref 70–99)
Glucose-Capillary: 206 mg/dL — ABNORMAL HIGH (ref 70–99)
Glucose-Capillary: 143 mg/dL — ABNORMAL HIGH (ref 70–99)
Glucose-Capillary: 101 mg/dL — ABNORMAL HIGH (ref 70–99)

## 2024-11-03 LAB — PHOSPHORUS: Phosphorus: 4.7 mg/dL — ABNORMAL HIGH (ref 2.5–4.6)

## 2024-11-03 MED ORDER — CHLORHEXIDINE GLUCONATE CLOTH 2 % EX PADS
6.0000 | MEDICATED_PAD | Freq: Every day | CUTANEOUS | Status: DC
  Administered 2024-11-05: 6 via TOPICAL

## 2024-11-03 MED ORDER — INSULIN ASPART 100 UNIT/ML IJ SOLN
0.0000 [IU] | INTRAMUSCULAR | Status: DC
  Administered 2024-11-03: 14:00:00 8 [IU] via SUBCUTANEOUS
  Filled 2024-11-03: qty 10

## 2024-11-03 MED ORDER — INSULIN GLARGINE 100 UNITS/ML SOLOSTAR PEN: 8.0000 [IU] | PEN_INJECTOR | SUBCUTANEOUS | Status: DC

## 2024-11-03 MED ORDER — TRIMETHOBENZAMIDE HCL 100 MG/ML IM SOLN
200.0000 mg | Freq: Three times a day (TID) | INTRAMUSCULAR | Status: DC | PRN
  Administered 2024-11-04 – 2024-11-05 (×2): 200 mg via INTRAMUSCULAR
  Filled 2024-11-03 (×3): qty 2

## 2024-11-03 MED ORDER — INSULIN ASPART 100 UNIT/ML IJ SOLN
0.0000 [IU] | Freq: Three times a day (TID) | INTRAMUSCULAR | Status: DC
  Administered 2024-11-03: 17:00:00 2 [IU] via SUBCUTANEOUS
  Administered 2024-11-04: 3 [IU] via SUBCUTANEOUS
  Administered 2024-11-04: 2 [IU] via SUBCUTANEOUS
  Administered 2024-11-05: 5 [IU] via SUBCUTANEOUS
  Administered 2024-11-05 (×2): 2 [IU] via SUBCUTANEOUS
  Filled 2024-11-03: qty 2
  Filled 2024-11-03: qty 3
  Filled 2024-11-03 (×4): qty 2

## 2024-11-03 MED ORDER — INSULIN GLARGINE 100 UNIT/ML ~~LOC~~ SOLN
8.0000 [IU] | Freq: Every day | SUBCUTANEOUS | Status: DC
  Filled 2024-11-03: qty 0.08

## 2024-11-03 MED ORDER — VANCOMYCIN HCL 750 MG/150ML IV SOLN
750.0000 mg | Freq: Once | INTRAVENOUS | Status: AC
  Administered 2024-11-03: 14:00:00 750 mg via INTRAVENOUS
  Filled 2024-11-03: qty 150

## 2024-11-03 MED ORDER — INSULIN GLARGINE 100 UNIT/ML ~~LOC~~ SOLN
15.0000 [IU] | Freq: Every day | SUBCUTANEOUS | Status: DC
  Administered 2024-11-03 – 2024-11-06 (×4): 15 [IU] via SUBCUTANEOUS
  Filled 2024-11-03 (×4): qty 0.15

## 2024-11-03 NOTE — TOC Progression Note (Signed)
 Transition of Care (TOC) - Progression Note    Patient Details  Name: [name] TELLEFSEN Sr. MRN: [id number] Date of Birth: 04-Aug-1961  Transition of Care [facility]) CM/SW Contact  Lauraine FORBES Saa, LCSWA Phone Number: 11/03/2024, 11:43 AM  Clinical Narrative:     11:43 AM Per progressions, patient remains on pressors. IR to evaluate patient for tunneled HD catheter. TOC will continue to follow.  Expected Discharge Plan: Skilled Nursing Facility Barriers to Discharge: Continued Medical Work up               Expected Discharge Plan and Services       Living arrangements for the past 2 months: Single Family Home                                       Social Drivers of Health (SDOH) Interventions SDOH Screenings   Food Insecurity: No Food Insecurity (10/24/2024)  Housing: Low Risk (10/24/2024)  Transportation Needs: No Transportation Needs (10/24/2024)  Utilities: Not At Risk (10/24/2024)  Alcohol Screen: Low Risk (02/04/2022)  Depression (PHQ2-9): Low Risk (08/06/2022)  Tobacco Use: Low Risk (10/26/2024)    Readmission Risk Interventions    10/25/2024    1:31 PM 02/11/2024    9:09 PM 11/05/2022    3:08 PM  Readmission Risk Prevention Plan  Transportation Screening Complete Complete Complete  Medication Review Oceanographer) Complete Complete Complete  PCP or Specialist appointment within 3-5 days of discharge Not Complete Complete Complete  HRI or Home Care Consult Complete Complete Complete  SW Recovery Care/Counseling Consult Complete  Complete  Palliative Care Screening Not Applicable Not Applicable Complete  Skilled Nursing Facility Not Complete Complete Complete

## 2024-11-03 NOTE — Progress Notes (Signed)
 IR asked to evaluate patient for tunneled dialysis catheter placement. Patient currently has a temporary dialysis catheter that is functioning appropriately. Patient currently in the ICU on levophed .   IR will plan for [facility] placement when patient is out of the ICU and/or off pressors. Primary team aware and the current order will be deleted. Please re-consult IR when patient is ready for [facility] placement.  Jerett Odonohue, AGACNP-BC 11/03/2024, 8:27 AM

## 2024-11-03 NOTE — Plan of Care (Signed)
 Patient denies pain, is eating well (no further nausea),  and shows no signs of anxiety.

## 2024-11-03 NOTE — Progress Notes (Signed)
 Attending: In brief this is a 63 year old male with a past medical history of end-stage renal on hemodialysis, heart failure with reduced ejection fraction (echocardiogram showing EF of 20 to 25%), severe aortic stenosis mild to moderate aortic regurgitation, severe PAD (R BKA),  recent diagnosis of MRSA bacteremia cleared his cultures on 10/26/2024.  MRSA bacteremia likely in the setting of multifocal pneumonia.  Presented to [facility] for continued ICU level of care and for placement of tunneled dialysis catheter.  Currently in the ICU vasopressor support on 7 mcg of Levophed .  Continued on antibiotics with vancomycin . Also on 2L [place] oxygen.    Subjective: Feeling well, has no complaints    Objective: Vitals:   11/03/24 0630 11/03/24 0645 11/03/24 0700 11/03/24 0713  BP: (!) 105/48 (!) 103/57 (!) 76/63   Pulse:   (!) 101   Resp:   15   Temp:    98.8 F (37.1 C)  TempSrc:    Axillary  SpO2:   99%   Weight:      Height:          Intake/Output Summary (Last 24 hours) at 11/03/2024 0823 Last data filed at 11/03/2024 0600 Gross per 24 hour  Intake 907.16 ml  Output 1500 ml  Net -592.84 ml    General: no acute distress, resting in bed HEENT: [place]/AT, moist mucous membranes, sclera anicteric Neuro: A&O x 3, moving all extremities CV: rrr, s1s2, systolic and diastolic murmur PULM: Decreased air entry more on the left base GI: soft, non-tender, non-distended, BS+ Extremities: warm, no edema, left sided fistula that has no thrill, left femoral HD cath Skin: no rashes   Labs:    Latest Ref Rng & Units 11/03/2024    5:01 AM 11/02/2024    4:39 AM 10/29/2024    3:43 AM  CBC  WBC 4.0 - 10.5 K/uL 9.7  9.3  10.2   Hemoglobin 13.0 - 17.0 g/dL 88.1  87.9  87.5   Hematocrit 39.0 - 52.0 % 36.2  36.7  38.0   Platelets 150 - 400 K/uL 380  388  219       Latest Ref Rng & Units 11/03/2024    5:01 AM 11/02/2024    4:39 AM 10/29/2024    3:43 AM  BMP  Glucose 70 - 99 mg/dL 727   673  729   BUN 8 - 23 mg/dL 40  53  38   Creatinine 0.61 - 1.24 mg/dL 2.97  1.75  2.28   Sodium 135 - 145 mmol/L 125  126  130   Potassium 3.5 - 5.1 mmol/L 4.4  4.7  3.9   Chloride 98 - 111 mmol/L 88  90  91   CO2 22 - 32 mmol/L 24  18  25    Calcium  8.9 - 10.3 mg/dL 7.9  7.8  8.3       Latest Ref Rng & Units 11/02/2024    4:39 AM 10/29/2024    3:43 AM 10/28/2024    7:49 AM  Hepatic Function  Total Protein 6.5 - 8.1 g/dL 8.0  7.4    Albumin  3.5 - 5.0 g/dL 3.1  2.8  2.8   AST 15 - 41 U/L 66  31    ALT 0 - 44 U/L 36  11    Alk Phosphatase 38 - 126 U/L 353  177    Total Bilirubin 0.0 - 1.2 mg/dL 1.3  1.4      Impression/Plan:  I agree with the impression  and plan as outlined by Dr Elicia with emphasis on the following:  #MRSA Bacteremia #Multifocal pneumonia #ESRD needing dialysis with a plan for a tunneled line #Hypotension #Type 2 DM with uncontrolled glucose #Hyponatremia likely hypervolemic  #Anemia related to chronic disease and ESRD   - Check CXR, repeat blood cultures - Check TSH, random cortisol - Dicussed with IR, plan for tunneled line once clinically doing better  - Will ask cardiology to evaluate, can probably hold off TEE if treating MRSA for 6 weeks, but will also ask them about TAVR. He would not tolerate GDMT - Lantus  15 units and increase SSI to moderate  - Renal following for dialysis  - Continue heparin  SQ    Patient Lines/Drains/Airways Status     Active Line/Drains/Airways     Name Placement date Placement time Site Days   Peripheral IV 10/24/24 20 G 1.88 Right;Lateral;Upper Arm 10/24/24  1356  Arm  10   Fistula / Graft Left Upper arm Arteriovenous fistula 09/22/15  0114  Upper arm  3330   Hemodialysis Catheter Left Femoral vein Triple lumen Temporary (Non-Tunneled) 10/31/24  1250  Femoral vein  3   Wound 11/02/24 1904 Other (Comment) Anus Mid 11/02/24  1904  Anus  1               Zola LOISE Herter, MD [facility] 11/03/2024 8:23 AM    The patient is critically ill due to shock currently on levophed  for pressure support, MRSA bacteremia with multifocal pneumonia.  Critical care was necessary to treat or prevent imminent or life-threatening deterioration. Critical care time was spent by me on the following activities: development of a treatment plan with the patient and/or surrogate as well as nursing, discussions with consultants, evaluation of the patient's response to treatment, examination of the patient, obtaining a history from the patient or surrogate, ordering and performing treatments and interventions, ordering and review of laboratory studies, ordering and review of radiographic studies, review of telemetry data including pulse oximetry, re-evaluation of patient's condition and participation in multidisciplinary rounds.   I personally spent 34 minutes providing critical care not including any separately billable procedures.

## 2024-11-03 NOTE — Progress Notes (Signed)
 [facility] Progress Note  Subjective:  Has HD yesterday 12/17 w/ 1.5 L off  Presentation summary: 63 year old man with past medical history significant for hypertension, type 2 diabetes mellitus, status post right below-knee amputation, nonobstructive coronary artery disease, congestive heart failure with reduced ejection fraction, obesity and end-stage renal disease on hemodialysis.  Admitted to the hospital yesterday with concerns of increasing difficulty with shortness of breath for the past 2 to 3 weeks with recently worsening chronic cough.  He recently was started on doxycycline  on 11/30 for possible LRTI and then on ciprofloxacin  on 12/3 for urinary tract infection. CT scan of the chest yesterday showed multifocal bronchopneumonia with small right and moderate loculated left pleural effusion with some atelectasis.  He was started on CAP coverage with vancomycin  and cefepime .  Overnight noted to be increasingly hypotensive prompting need to initiate Levophed .  He also has positive blood cultures (4 out of 4) for gram-positive cocci which is very concerning in the setting of an indwelling left IJ TDC.  Vitals:   11/03/24 0630 11/03/24 0645 11/03/24 0700 11/03/24 0713  BP: (!) 105/48 (!) 103/57 (!) 76/63   Pulse:   (!) 101   Resp:   15   Temp:    98.8 F (37.1 C)  TempSrc:    Axillary  SpO2:   99%   Weight:      Height:        Exam: Gen: NAD CVS: RRR Resp:occ rhonchi Abd: +BS, soft, NT/ND Ext: s/p RBKA, trace pretibial edema of LLE LIJ TDC intact  Home bp meds: Midodrine  20mg  tid    OP HD: [facility] MWF   4h  B400   101.9kg  TDC  Hep 6000    Assessment/ Plan: ESRD: on HD MWF. Next HD Friday.  Hypotension: acute on chronic (on midodrine  20 tid at home) due to infection. On levo gtt 8-12 today.  Multifocal PNA: started on IV vanc/ cefepime , now IV vanc.  Per pmd.  MRSA bacteremia: from 12/08 blood cx's. Getting IV vanc since 12/08. F/u blood cx's on 12/10  were negative. ID following. Dr. Evonnie removed the [facility] on 12/12.  Temporary left fem HD cath placed 12/15 also at [facility]. IR consulted for new TDC, and they said to re-consult them when pt is off pressors and out of ICU.   Anemia of esrd: Hb 11-13 here, no esa needs.  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 IR consulting and aware (as above).    [name] MD  [facility] 11/03/2024, 8:28 AM  Recent Labs  Lab 10/28/24 0749 10/29/24 0343 11/02/24 0439 11/03/24 0501  HGB 12.0* 12.4* 12.0* 11.8*  ALBUMIN  2.8* 2.8* 3.1*  --   CALCIUM  7.8* 8.3* 7.8* 7.9*  PHOS 5.5*  --   --  4.7*  CREATININE 10.90* 7.71* 8.24* 7.02*  K 3.9 3.9 4.7 4.4   No results for input(s): IRON , TIBC, FERRITIN in the last 168 hours. Inpatient medications:  aspirin  EC  81 mg Oral Q breakfast   Chlorhexidine  Gluconate Cloth  6 each Topical Q0600   cinacalcet   60 mg Oral Q breakfast   heparin   5,000 Units Subcutaneous Q8H   insulin  aspart  0-5 Units Subcutaneous QHS   insulin  aspart  0-9 Units Subcutaneous TID WC   insulin  aspart  2 Units Subcutaneous TID WC   insulin  glargine  8 Units Subcutaneous  Daily   midodrine   20 mg Oral TID WC   sevelamer  carbonate  4,000 mg Oral TID WC    anticoagulant sodium citrate      norepinephrine  (LEVOPHED ) Adult infusion 9 mcg/min (11/03/24 0600)   vancomycin  Stopped (11/01/24 0107)   acetaminophen  **OR** acetaminophen , alteplase , anticoagulant sodium citrate , guaiFENesin -dextromethorphan , heparin , heparin , heparin , ipratropium-albuterol , lidocaine  (PF), lidocaine -prilocaine , ondansetron  **OR** ondansetron  (ZOFRAN ) IV, mouth rinse, pentafluoroprop-tetrafluoroeth, polyethylene glycol

## 2024-11-03 NOTE — Progress Notes (Signed)
 NAME:  [name] Frett., MRN:  [id number], DOB:  [birth year], LOS: 10 ADMISSION DATE:  10/24/2024, CONSULTATION DATE:  12/17 REFERRING MD:  Ricky - [facility], CHIEF COMPLAINT:  hypotension  History of Present Illness:  [name] KRASINSKI Sr. is a 63 y.o. male who has a PMH as outlined below including but not limited to ESRD on HD MWF, A-fib, systolic CHF, hypertension, LBBB, DM.  He presented to [facility] ED on 10/24/2024 with dyspnea x roughly 2-3 weeks. He had been seen in the ED a few days early on 12/2 for dysuria and was started on a course of Cipro  which she started the following day and stated that he had been taking as prescribed.     CT chest that demonstrated multifocal pneumonia and small effusions.  Blood cultures ultimately grew MRSA.  He had been on vancomycin  which was continued. He had a left thora 12/10 with 450cc fluid removed.  His HD catheter was removed on 10/28/2024 as well as an arterial line.  He did have a TTE on 12/11 that showed an EF of 20-25%, G2 DD, mild decrease in RV SF, severe AAS, no vegetations.  TEE was recommended but had been deferred due to patient requiring vasopressor support.  ID was consulted and recommended line holiday.  He had a temporary femoral HD cath placed on 12/15.  ID recommendations included 6 weeks of vancomycin  to end on 12/09/2024.    12/17, he was transferred to [facility] for ongoing management for septic shock as well as for IR evaluation for tunneled HD catheter placement  Pertinent  Medical History  ESRD on HD MWF, Afib, HFrEF, T2DM, LBBB, severe PVD s/p R BKA Past Medical History:  Diagnosis Date   ESRD on hemodialysis (HCC)    a. HD MWF > 10 years.   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 01/2022 Echo: EF 45-50%, glob HK, mod LVH, mildly reduced RV fxn, mild MR, mod-sev MV Ca2+. Mild AS; b. 10/2023 Echo: EF 45-50%, glob HK, GrII DD, mildly reduced RV fxn, RVSP 52.11mmHg. Mild MR/MS, mod TR.   Hypertension    Nonischemic  cardiomyopathy (HCC)    a. 2019 Cath [facility]): Nonobstructive disease; b. 12/2019 Echo: EF 50%; c. 12/2019 MV (Tx w/u [facility]): Ant infarct w/ peri-infarct ischemia. EF 35%; c. 05/2021 MV (Tx w/u [facility]): Cor Ca2+, EF 53%, motion artifact, equiv study, likely low risk.   Nonobstructive coronary artery disease    a.  2019 Cath ([place]): Left main normal, LAD 30 proximal, RCA 30 distal, EF 25%.   Obesity    Peripheral arterial disease    Peripheral vascular disease    Type 2 diabetes mellitus (HCC)    a. 10/2023 - admission w/ HHS.   Significant Hospital Events: Including procedures, antibiotic start and stop dates in addition to other pertinent events   12/8 admit 12/10 L thora with 450cc removed, transudate, neg cultures 12/11 echo 12/12 HD cath and art line removed 12/15 new femoral HD cath placed 12/17 transfer to [facility]  Interim History / Subjective:  Awake and alert. Denies any pain. No pain at BKA site. On 2L [address]. On Levophed .   Objective    Blood pressure 94/70, pulse 100, temperature 98.8 F (37.1 C), temperature source Oral, resp. rate (!) 23, height 6' 1 (1.854 m), weight 110.5 kg, SpO2 96%.        Intake/Output Summary (Last 24 hours) at 11/03/2024 9370 Last data filed  at 11/03/2024 0600 Gross per 24 hour  Intake 907.16 ml  Output 1500 ml  Net -592.84 ml   Filed Weights   10/28/24 1220 11/02/24 1003 11/02/24 1340  Weight: 105.3 kg 112 kg 110.5 kg    Examination: General: Awake, chronically ill appearing, in no acute distress Lungs: normal effort on 2L [place], no wheezing Cardiovascular: RRR Abdomen: bowel sounds present, soft, non-tender Extremities: LUE AVF (not functioning), R BKA, 1+ pitting edema of LLE Neuro: A&Ox3  Resolved problem list   Assessment and Plan   Neuro No acute issues. A&Ox3.   Respiratory Acute hypoxic respiratory failure 2/2 multifocal pneumonia  On 2L [place]. Had thora 12/10 for pleural effusion, suggestive of transudative.   -Wean supplemental O2 as tolerated to baseline  -US  effusion if able   Cardiac HFrEF 20-25% Severe Aortic stenosis 12/11 TTE EF 20-25%, G2DD, mild decrease RVF; severe AS, no vegetation -GDMT limited in setting of ESRD and pressor need -Volume control w/ HD  -Re-engage cardiology for recs given severe AS  Septic shock -Levophed , wean as BP allows, maintain MAP >65 -Midodrine  20 mg TID  -see ID section below  -repeat Bcx, CXR, TSH, AM cortisol, coox   GI Bowel: Miralax  PRN, LBM 12/17  ID Septic shock 2/2 MRSA bacteremia  12/8 Bcx grew MRSA, repeat Bcx 12/10 negative and Bcx 12/12 negative. Had line holiday. ID had followed while at [facility].  -Vanco x 6 weeks (EOT 12/09/24)   Lines: PIV, L femoral HD  Endo  T2DM A1c 8.6 this month. CBGs have been above goal on SSI.  -Start Lantus  15 units  -Change SSI to moderate   Renal ESRD on HD MWF Secondary hyperparathyroidism Per most recent fistulogram, he has an occluded subclavian vein on the right but patent RIJ vein from May, 2025.  -Appreciate Nephrology assistance, HD 12/17 w/ 1.5 L removed via temp femoral HD cath (placed 12/15) -Consulted IR for [facility] by admitting CCM team -Continue cinacalcet  and sevelamer    Chronic Hyponatremia  Na 125. Noted weight gain since admission. Has gotten HD w/ last HD 12/17 w/ 1.5 L removed. I anticipate removing volume will improve Na.   Heme Microcytic anemia Anemia of chronic kidney disease Hgb steady 11-12. No signs of bleeding. Treating MRSA bacteremia currently.   Anticoagulation: heparin  SQ  MSK/Other Hx of PVD: No signs of infection at R BKA. On home ASA.    Labs   CBC: Recent Labs  Lab 10/28/24 0749 10/29/24 0343 11/02/24 0439 11/03/24 0501  WBC 9.1 10.2 9.3 9.7  HGB 12.0* 12.4* 12.0* 11.8*  HCT 36.2* 38.0* 36.7* 36.2*  MCV 73.9* 73.8* 75.7* 75.4*  PLT 208 219 388 380    Basic Metabolic Panel: Recent Labs  Lab 10/28/24 0749 10/29/24 0343 11/02/24 0439  11/03/24 0501  NA 130* 130* 126* 125*  K 3.9 3.9 4.7 4.4  CL 90* 91* 90* 88*  CO2 25 25 18* 24  GLUCOSE 234* 270* 326* 272*  BUN 59* 38* 53* 40*  CREATININE 10.90* 7.71* 8.24* 7.02*  CALCIUM  7.8* 8.3* 7.8* 7.9*  MG  --  2.4  --  1.9  PHOS 5.5*  --   --  4.7*   GFR: Estimated Creatinine Clearance: 14.2 mL/min (A) (by C-G formula based on SCr of 7.02 mg/dL (H)). Recent Labs  Lab 10/28/24 0749 10/29/24 0343 11/02/24 0439 11/03/24 0501  WBC 9.1 10.2 9.3 9.7    Liver Function Tests: Recent Labs  Lab 10/28/24 0749 10/29/24 0343 11/02/24 0439  AST  --  31 66*  ALT  --  11 36  ALKPHOS  --  177* 353*  BILITOT  --  1.4* 1.3*  PROT  --  7.4 8.0  ALBUMIN  2.8* 2.8* 3.1*   No results for input(s): LIPASE, AMYLASE in the last 168 hours. No results for input(s): AMMONIA in the last 168 hours.  ABG    Component Value Date/Time   PHART 7.42 11/11/2023 1225   PCO2ART 36 11/11/2023 1225   PO2ART 59 (L) 11/11/2023 1225   HCO3 33.0 (H) 02/11/2024 1506   TCO2 27 03/30/2024 0757   ACIDBASEDEF 0.6 11/11/2023 1225   O2SAT 32.3 02/11/2024 1506     Coagulation Profile: No results for input(s): INR, PROTIME in the last 168 hours.  Cardiac Enzymes: No results for input(s): CKTOTAL, CKMB, CKMBINDEX, TROPONINI in the last 168 hours.  HbA1C: Hgb A1c MFr Bld  Date/Time Value Ref Range Status  10/24/2024 10:14 AM 8.6 (H) 4.8 - 5.6 % Final    Comment:    (NOTE) Diagnosis of Diabetes The following HbA1c ranges recommended by the American Diabetes Association (ADA) may be used as an aid in the diagnosis of diabetes mellitus.  Hemoglobin             Suggested A1C NGSP%              Diagnosis  <5.7                   Non Diabetic  5.7-6.4                Pre-Diabetic  >6.4                   Diabetic  <7.0                   Glycemic control for                       adults with diabetes.    02/11/2024 02:27 PM 8.4 (H) 4.8 - 5.6 % Final    Comment:     (NOTE) Pre diabetes:          5.7%-6.4%  Diabetes:              >6.4%  Glycemic control for   <7.0% adults with diabetes     CBG: Recent Labs  Lab 11/02/24 1212 11/02/24 1657 11/02/24 2157 11/02/24 2324 11/03/24 0327  GLUCAP 182* 204* 238* 241* 254*    Review of Systems:   As above   Past Medical History:  He,  has a past medical history of ESRD on hemodialysis (HCC), Heart failure with mid-range ejection fraction (HCC), Hypertension, Nonischemic cardiomyopathy (HCC), Nonobstructive coronary artery disease, Obesity, Peripheral arterial disease, Peripheral vascular disease, and Type 2 diabetes mellitus (HCC).   Surgical History:   Past Surgical History:  Procedure Laterality Date   A/V FISTULAGRAM N/A 08/15/2019   Procedure: A/V FISTULAGRAM;  Surgeon: Sheree Penne Bruckner, MD;  Location: [facility] INVASIVE CV LAB;  Service: Cardiovascular;  Laterality: N/A;   A/V FISTULAGRAM N/A 01/21/2024   Procedure: A/V Fistulagram;  Surgeon: Melia Lynwood ORN, MD;  Location: [facility] INVASIVE CV LAB;  Service: Cardiovascular;  Laterality: N/A;   AMPUTATION Right 02/02/2022   Procedure: AMPUTATION 5th RAY;  Surgeon: Joya Stabs, MD;  Location: [facility] OR;  Service: Podiatry;  Laterality: Right;   AMPUTATION Right 05/23/2022   Procedure: RIGHT BELOW KNEE AMPUTATION;  Surgeon: Harden Jerona GAILS, MD;  Location: [facility]  OR;  Service: Orthopedics;  Laterality: Right;   AMPUTATION Right 07/04/2022   Procedure: REVISION AMPUTATION BELOW KNEE;  Surgeon: Harden Jerona GAILS, MD;  Location: [facility] OR;  Service: Orthopedics;  Laterality: Right;   APPLICATION OF WOUND VAC  05/23/2022   Procedure: APPLICATION OF WOUND VAC;  Surgeon: Harden Jerona GAILS, MD;  Location: [facility] OR;  Service: Orthopedics;;   AV FISTULA PLACEMENT     CARDIAC CATHETERIZATION  02/17/2018   LHC 02/17/18 ([name]): 30% pLAD, 30% dRCA, LVEDP 25-30, LVEF 25%.     CATARACT EXTRACTION W/PHACO Right 03/29/2020   Procedure: CATARACT EXTRACTION PHACO AND INTRAOCULAR  LENS PLACEMENT (IOC)  RIGHT DIABETIC;  Surgeon: Mittie Gaskin, MD;  Location: [facility] ORS;  Service: Ophthalmology;  Laterality: Right;  Lot # [id number] H US : 02:30.9 AP% 9.5% CDE: 14.38   EYE SURGERY Right    cataract removed   [name] N/A 12/02/2023   Procedure: [name];  Surgeon: Federico Rosario BROCKS, MD;  Location: [facility] ENDOSCOPY;  Service: Gastroenterology;  Laterality: N/A;   I & D EXTREMITY Right 02/02/2022   Procedure: IRRIGATION AND DEBRIDEMENT RIGHT FOOT;  Surgeon: Joya Stabs, MD;  Location: [facility] OR;  Service: Podiatry;  Laterality: Right;   INCISION AND DRAINAGE OF WOUND Right 02/08/2022   Procedure: IRRIGATION AND DEBRIDEMENT WOUND;  Surgeon: Burt Fus, DPM;  Location: [facility] OR;  Service: Podiatry;  Laterality: Right;  with removal of infected bone    INSERTION OF DIALYSIS CATHETER  08/13/2019   Procedure: Insertion Of Dialysis Catheter;  Surgeon: Oris Krystal FALCON, MD;  Location: [facility] OR;  Service: Vascular;;   INSERTION OF DIALYSIS CATHETER Left 07/19/2020   Procedure: INSERTION OF DIALYSIS CATHETER;  Surgeon: Sheree Penne Bruckner, MD;  Location: [facility] OR;  Service: Vascular;  Laterality: Left;   IR FLUORO GUIDE CV LINE LEFT  02/16/2024   IR LUMBAR PUNCTURE  11/20/2023   IR US  GUIDE BX ASP/DRAIN  11/07/2022   IR US  GUIDE VASC ACCESS LEFT  02/16/2024   IRRIGATION AND DEBRIDEMENT ABSCESS     PERIPHERAL VASCULAR BALLOON ANGIOPLASTY  08/15/2019   Procedure: PERIPHERAL VASCULAR BALLOON ANGIOPLASTY;  Surgeon: Sheree Penne Bruckner, MD;  Location: [facility] INVASIVE CV LAB;  Service: Cardiovascular;;   PERIPHERAL VASCULAR BALLOON ANGIOPLASTY Left 02/06/2022   Procedure: PERIPHERAL VASCULAR BALLOON ANGIOPLASTY;  Surgeon: Gretta Bruckner PARAS, MD;  Location: [facility] INVASIVE CV LAB;  Service: Cardiovascular;  Laterality: Left;   PERIPHERAL VASCULAR BALLOON ANGIOPLASTY  01/21/2024   Procedure: PERIPHERAL VASCULAR BALLOON ANGIOPLASTY;  Surgeon: Melia Lynwood ORN, MD;  Location: [facility] INVASIVE CV  LAB;  Service: Cardiovascular;;  Cephalic Arch   RADIOLOGY WITH ANESTHESIA N/A 11/20/2023   Procedure: MRI WITH ANESTHESIA;  Surgeon: Radiologist, Medication, MD;  Location: [facility] OR;  Service: Radiology;  Laterality: N/A;   RADIOLOGY WITH ANESTHESIA N/A 11/20/2023   Procedure: IR WITH ANESTHESIA;  Surgeon: Alona Corners, DO;  Location: [facility] OR;  Service: Anesthesiology;  Laterality: N/A;  PLEASE OBTAIN OPENING PRESSURE AND DRAIN AT LEAST 30CC'S OF CSF.   REVISON OF ARTERIOVENOUS FISTULA Left 07/19/2020   Procedure: REVISON/PLICATION OF ARTERIOVENOUS FISTULA LEFT;  Surgeon: Sheree Penne Bruckner, MD;  Location: [facility] OR;  Service: Vascular;  Laterality: Left;   THROMBECTOMY AND REVISION OF ARTERIOVENTOUS (AV) GORETEX  GRAFT Left 08/13/2019   Procedure: THROMBECTOMY OF LEFT ARM ARTERIOVENTOUS (AV) FISTULA;  Surgeon: Oris Krystal FALCON, MD;  Location: [facility] OR;  Service: Vascular;  Laterality: Left;   TOE AMPUTATION     UPPER EXTREMITY VENOGRAPHY Bilateral 03/30/2024  Procedure: UPPER EXTREMITY VENOGRAPHY;  Surgeon: Lanis Fonda BRAVO, MD;  Location: [facility] INVASIVE CV LAB;  Service: Cardiovascular;  Laterality: Bilateral;     Social History:   reports that he has never smoked. He has never used smokeless tobacco. He reports current alcohol use. He reports that he does not use drugs.   Family History:  His family history includes Hypertension in his mother.   Allergies Allergies[1]   Home Medications  Prior to Admission medications  Medication Sig Start Date End Date Taking? Authorizing Provider  acetaminophen  (TYLENOL ) 500 MG tablet Take 1,000 mg by mouth 2 (two) times daily as needed for moderate pain (pain score 4-6), headache or fever.   Yes [provider]  aspirin  EC 81 MG tablet Take 1 tablet (81 mg total) by mouth daily with breakfast. Swallow whole. 02/17/24  Yes Emokpae, Courage, MD  cinacalcet  (SENSIPAR ) 30 MG tablet Take 30 mg by mouth daily.   Yes [provider]  ciprofloxacin  (CIPRO )  500 MG tablet Take 1 tablet (500 mg total) by mouth 2 (two) times daily. One po bid x 7 days 10/18/24  Yes Delo, Vicenta, MD  doxycycline  (VIBRAMYCIN ) 100 MG capsule Take 1 capsule (100 mg total) by mouth 2 (two) times daily. One po bid x 7 days 10/16/24  Yes Zammit, Joseph, MD  gabapentin  (NEURONTIN ) 300 MG capsule Take 300 mg by mouth daily.   Yes [provider]  insulin  isophane & regular human KwikPen (NOVOLIN  70/30 KWIKPEN) (70-30) 100 UNIT/ML KwikPen Inject 30U in the morning and 25U in the evening Patient taking differently: Inject 25-30 Units into the skin See admin instructions. Inject 30 Units in the morning and 25 Units in the evening 10/27/23  Yes Ezenduka, Nkeiruka J, MD  midodrine  (PROAMATINE ) 10 MG tablet Take 10 mg by mouth 3 (three) times daily. 06/10/24  Yes [provider]  vancomycin  IVPB Inject 1,000 mg into the vein every Monday, Wednesday, and Friday with hemodialysis. Indication:  MRSA Bacteremia Last Day of Therapy:  12/09/24 Labs - Once weekly: Vanc [place], CBC 10/31/24 12/09/24 Yes Vu, Constance DASEN, MD  Insulin  Pen Needle 32G X 4 MM MISC Use with insulin  pen. 10/28/23   Ezenduka, Nkeiruka J, MD  pantoprazole  (PROTONIX ) 40 MG tablet Take 1 tablet (40 mg total) by mouth 2 (two) times daily. Patient not taking: Reported on 10/24/2024 01/13/24 06/07/24  Verdene Purchase, MD     Critical care time:               [1]  Allergies Allergen Reactions   Sulfa Antibiotics Diarrhea and Nausea And Vomiting

## 2024-11-04 DIAGNOSIS — J188 Other pneumonia, unspecified organism: Secondary | ICD-10-CM | POA: Diagnosis not present

## 2024-11-04 DIAGNOSIS — B9562 Methicillin resistant Staphylococcus aureus infection as the cause of diseases classified elsewhere: Secondary | ICD-10-CM | POA: Diagnosis not present

## 2024-11-04 DIAGNOSIS — R7881 Bacteremia: Secondary | ICD-10-CM | POA: Diagnosis not present

## 2024-11-04 DIAGNOSIS — E119 Type 2 diabetes mellitus without complications: Secondary | ICD-10-CM | POA: Diagnosis not present

## 2024-11-04 DIAGNOSIS — E871 Hypo-osmolality and hyponatremia: Secondary | ICD-10-CM | POA: Diagnosis not present

## 2024-11-04 DIAGNOSIS — Z992 Dependence on renal dialysis: Secondary | ICD-10-CM | POA: Diagnosis not present

## 2024-11-04 DIAGNOSIS — N186 End stage renal disease: Secondary | ICD-10-CM | POA: Diagnosis not present

## 2024-11-04 DIAGNOSIS — D631 Anemia in chronic kidney disease: Secondary | ICD-10-CM | POA: Diagnosis not present

## 2024-11-04 LAB — BASIC METABOLIC PANEL WITH GFR
Anion gap: 15 (ref 5–15)
BUN: 46 mg/dL — ABNORMAL HIGH (ref 8–23)
CO2: 20 mmol/L — ABNORMAL LOW (ref 22–32)
Calcium: 7.8 mg/dL — ABNORMAL LOW (ref 8.9–10.3)
Chloride: 87 mmol/L — ABNORMAL LOW (ref 98–111)
Creatinine, Ser: 7.86 mg/dL — ABNORMAL HIGH (ref 0.61–1.24)
GFR, Estimated: 7 mL/min — ABNORMAL LOW
Glucose, Bld: 114 mg/dL — ABNORMAL HIGH (ref 70–99)
Potassium: 4.6 mmol/L (ref 3.5–5.1)
Sodium: 122 mmol/L — ABNORMAL LOW (ref 135–145)

## 2024-11-04 LAB — RENAL FUNCTION PANEL
Albumin: 2.8 g/dL — ABNORMAL LOW (ref 3.5–5.0)
Anion gap: 16 — ABNORMAL HIGH (ref 5–15)
BUN: 34 mg/dL — ABNORMAL HIGH (ref 8–23)
CO2: 19 mmol/L — ABNORMAL LOW (ref 22–32)
Calcium: 7.9 mg/dL — ABNORMAL LOW (ref 8.9–10.3)
Chloride: 87 mmol/L — ABNORMAL LOW (ref 98–111)
Creatinine, Ser: 6.37 mg/dL — ABNORMAL HIGH (ref 0.61–1.24)
GFR, Estimated: 9 mL/min — ABNORMAL LOW
Glucose, Bld: 164 mg/dL — ABNORMAL HIGH (ref 70–99)
Phosphorus: 3.9 mg/dL (ref 2.5–4.6)
Potassium: 4.2 mmol/L (ref 3.5–5.1)
Sodium: 122 mmol/L — ABNORMAL LOW (ref 135–145)

## 2024-11-04 LAB — CBC
HCT: 34.9 % — ABNORMAL LOW (ref 39.0–52.0)
Hemoglobin: 11.5 g/dL — ABNORMAL LOW (ref 13.0–17.0)
MCH: 24.7 pg — ABNORMAL LOW (ref 26.0–34.0)
MCHC: 33 g/dL (ref 30.0–36.0)
MCV: 75.1 fL — ABNORMAL LOW (ref 80.0–100.0)
Platelets: 335 K/uL (ref 150–400)
RBC: 4.65 MIL/uL (ref 4.22–5.81)
RDW: 24.2 % — ABNORMAL HIGH (ref 11.5–15.5)
WBC: 8.1 K/uL (ref 4.0–10.5)
nRBC: 0 % (ref 0.0–0.2)

## 2024-11-04 LAB — GLUCOSE, CAPILLARY
Glucose-Capillary: 111 mg/dL — ABNORMAL HIGH (ref 70–99)
Glucose-Capillary: 132 mg/dL — ABNORMAL HIGH (ref 70–99)
Glucose-Capillary: 169 mg/dL — ABNORMAL HIGH (ref 70–99)
Glucose-Capillary: 135 mg/dL — ABNORMAL HIGH (ref 70–99)
Glucose-Capillary: 115 mg/dL — ABNORMAL HIGH (ref 70–99)
Glucose-Capillary: 90 mg/dL (ref 70–99)

## 2024-11-04 LAB — MAGNESIUM: Magnesium: 1.9 mg/dL (ref 1.7–2.4)

## 2024-11-04 LAB — PHOSPHORUS: Phosphorus: 4.7 mg/dL — ABNORMAL HIGH (ref 2.5–4.6)

## 2024-11-04 MED ORDER — ANTICOAGULANT SODIUM CITRATE 4% (200MG/5ML) IV SOLN: 5.0000 mL | Status: DC | PRN

## 2024-11-04 MED ORDER — LIDOCAINE HCL (PF) 1 % IJ SOLN: 5.0000 mL | INTRAMUSCULAR | Status: DC | PRN

## 2024-11-04 MED ORDER — ALBUMIN HUMAN 25 % IV SOLN
25.0000 g | Freq: Once | INTRAVENOUS | Status: AC
  Administered 2024-11-04: 25 g via INTRAVENOUS

## 2024-11-04 MED ORDER — HEPARIN SODIUM (PORCINE) 1000 UNIT/ML DIALYSIS
1000.0000 [IU] | INTRAMUSCULAR | Status: DC | PRN
  Administered 2024-11-04: 2800 [IU]
  Filled 2024-11-04: qty 1

## 2024-11-04 MED ORDER — HEPARIN SODIUM (PORCINE) 1000 UNIT/ML DIALYSIS: 1500.0000 [IU] | INTRAMUSCULAR | Status: DC | PRN

## 2024-11-04 MED ORDER — HEPARIN SODIUM (PORCINE) 1000 UNIT/ML DIALYSIS
4000.0000 [IU] | Freq: Once | INTRAMUSCULAR | Status: DC
  Filled 2024-11-04: qty 4

## 2024-11-04 MED ORDER — LIDOCAINE-PRILOCAINE 2.5-2.5 % EX CREA: 1.0000 | TOPICAL_CREAM | CUTANEOUS | Status: DC | PRN

## 2024-11-04 MED ORDER — PHENOL 1.4 % MT LIQD
1.0000 | OROMUCOSAL | Status: DC | PRN
  Administered 2024-11-04: 1 via OROMUCOSAL
  Filled 2024-11-04: qty 177

## 2024-11-04 MED ORDER — PENTAFLUOROPROP-TETRAFLUOROETH EX AERO: 1.0000 | INHALATION_SPRAY | CUTANEOUS | Status: DC | PRN

## 2024-11-04 MED ORDER — SODIUM CHLORIDE 0.9 % IV SOLN
2.0000 g | INTRAVENOUS | Status: AC
  Administered 2024-11-04 – 2024-11-08 (×5): 2 g via INTRAVENOUS
  Filled 2024-11-04 (×5): qty 20

## 2024-11-04 MED ORDER — NEPRO/CARBSTEADY PO LIQD: 237.0000 mL | ORAL | Status: DC | PRN

## 2024-11-04 MED ORDER — GUAIFENESIN ER 600 MG PO TB12
600.0000 mg | ORAL_TABLET | Freq: Two times a day (BID) | ORAL | Status: DC
  Administered 2024-11-04 – 2024-11-30 (×53): 600 mg via ORAL
  Filled 2024-11-04 (×41): qty 1

## 2024-11-04 NOTE — Progress Notes (Signed)
 " [facility] Progress Note   Subjective:   Seen in room - remains on levophed  drip, but no CP/dyspnea, on room air.  Objective Vitals:   11/04/24 0630 11/04/24 0700 11/04/24 0731 11/04/24 0830  BP: (!) 103/55 117/62  (!) 117/44  Pulse: 98 100  (!) 101  Resp: (!) 27 (!) 27  16  Temp:   97.7 F (36.5 C)   TempSrc:   Oral   SpO2: 96% 98%  96%  Weight:      Height:       Physical Exam General: Chronically ill appearing, NAD. Room air Heart: RRR Lungs: CTA anteriorly Abdomen: soft Extremities: no LE edema Dialysis Access: L femoral temp cath  Additional Objective Labs: Basic Metabolic Panel: Recent Labs  Lab 11/02/24 0439 11/03/24 0501 11/04/24 0346  NA 126* 125* 122*  K 4.7 4.4 4.6  CL 90* 88* 87*  CO2 18* 24 20*  GLUCOSE 326* 272* 114*  BUN 53* 40* 46*  CREATININE 8.24* 7.02* 7.86*  CALCIUM  7.8* 7.9* 7.8*  PHOS  --  4.7* 4.7*   Liver Function Tests: Recent Labs  Lab 10/29/24 0343 11/02/24 0439  AST 31 66*  ALT 11 36  ALKPHOS 177* 353*  BILITOT 1.4* 1.3*  PROT 7.4 8.0  ALBUMIN  2.8* 3.1*   CBC: Recent Labs  Lab 10/29/24 0343 11/02/24 0439 11/03/24 0501 11/04/24 0346  WBC 10.2 9.3 9.7 8.1  HGB 12.4* 12.0* 11.8* 11.5*  HCT 38.0* 36.7* 36.2* 34.9*  MCV 73.8* 75.7* 75.4* 75.1*  PLT 219 388 380 335   Blood Culture    Component Value Date/Time   SDES BLOOD RIGHT HAND 11/03/2024 0841   SPECREQUEST  11/03/2024 0841    BOTTLES DRAWN AEROBIC AND ANAEROBIC Blood Culture adequate volume   CULT  11/03/2024 0841    NO GROWTH < 24 HOURS Performed at [facility], [address], [place], [place] [address]    REPTSTATUS PENDING 11/03/2024 9158   Studies/Results: DG CHEST PORT 1 VIEW Result Date: 11/03/2024 EXAM: 1 VIEW(S) XRAY OF THE CHEST 11/03/2024 08:29:00 AM COMPARISON: 10/26/2024 CLINICAL HISTORY: Hypotension (arterial) FINDINGS: LINES, TUBES AND DEVICES: Left IJ CVC removed. LUNGS AND PLEURA: Retrocardiac opacity . Mild  pulmonary vascular congestion. Small left pleural effusion. No pneumothorax. HEART AND MEDIASTINUM: Cardiomegaly, unchanged. Atherosclerotic calcifications. BONES AND SOFT TISSUES: No acute osseous abnormality. IMPRESSION: 1. Retrocardiac atelectasis or pneumonia. Small left pleural effusion. Electronically signed by: [name] Gatlin MD 11/03/2024 07:39 PM EST RP Workstation: [id number]   Medications:  anticoagulant sodium citrate      cefTRIAXone  (ROCEPHIN )  IV 2 g (11/04/24 0933)   norepinephrine  (LEVOPHED ) Adult infusion 10 mcg/min (11/04/24 0800)   vancomycin  Stopped (11/01/24 0107)    aspirin  EC  81 mg Oral Q breakfast   Chlorhexidine  Gluconate Cloth  6 each Topical Q0600   Chlorhexidine  Gluconate Cloth  6 each Topical Q0600   cinacalcet   60 mg Oral Q breakfast   heparin   5,000 Units Subcutaneous Q8H   insulin  aspart  0-15 Units Subcutaneous TID WC   insulin  aspart  0-5 Units Subcutaneous QHS   insulin  aspart  2 Units Subcutaneous TID WC   insulin  glargine  15 Units Subcutaneous Daily   midodrine   20 mg Oral TID WC   sevelamer  carbonate  4,000 mg Oral TID WC    Dialysis Orders MWF - [facility] 4:15hr, 400/800, EDW 101.9kg, 2K/2Ca bath, TDC, UFP #4, heparin  6000 unit bolus - Hectoral 5mcg IV q HD - No ESA/iron   Assessment/Plan: MRSA bacteremia: Blood Cx 12/8 +, repeat 12/10, 12/12, 12/18 negative. On Vancomycin . TDC out, s/p temp line 12/15. IR consulted for new line - plan is wait until off pressors and out of ICU. Multifocal pneumonia: Ceftriaxone  added, pleural fluid Cx 12/10 negative. ESRD: Usual MWF schedule -> for HD today. Try higher UF goal. HypoTN/volume: Chronic low BP on mido 20mg  TID, also on  pressors. HypoNa indicative of overload. UF as tolerated. Anemia of ESRD: Hgb remains > 11, no ESA needed Secondary HPTH: CorrCa/Phos ok - continue home meds. Nutrition: Alb low, add supps T2DM: Insulin  per primary. HFrEF (EF 20-25% at baseline)   Izetta Boehringer, PA-C 11/04/2024,  9:43 AM  [facility]    "

## 2024-11-04 NOTE — Plan of Care (Signed)
" °  Problem: Activity: Goal: Risk for activity intolerance will decrease Outcome: Progressing   Problem: Nutrition: Goal: Adequate nutrition will be maintained Outcome: Progressing   Problem: Elimination: Goal: Will not experience complications related to bowel motility Outcome: Progressing   Problem: Safety: Goal: Ability to remain free from injury will improve Outcome: Progressing   Problem: Activity: Goal: Ability to tolerate increased activity will improve Outcome: Progressing   "

## 2024-11-04 NOTE — Progress Notes (Signed)
 At patient's bedside ,alert and oriented x 4.Consent for permit verified.  Access used; Left femoral hd catheter that did not worked well due to patient's coughing spell ( due to increased intra-abdominal pressure that made hd machine beeps a lot.  Duration of treatment; 3.5 hours.  Uf goal:  Met 3 liters.  Medicine given: Albumin  25 g.  Hand off to the patient's nurse with stable vitals.

## 2024-11-04 NOTE — Progress Notes (Signed)
 "  NAME:  [name] Siems., MRN:  [id number], DOB:  [birth year], LOS: 11 ADMISSION DATE:  10/24/2024, CONSULTATION DATE:  12/17 REFERRING MD:  Ricky - [facility], CHIEF COMPLAINT:  hypotension  History of Present Illness:  [name] DURKIN Sr. is a 63 y.o. male who has a PMH as outlined below including but not limited to ESRD on HD MWF, A-fib, systolic CHF, hypertension, LBBB, DM.  He presented to [facility] ED on 10/24/2024 with dyspnea x roughly 2-3 weeks. He had been seen in the ED a few days early on 12/2 for dysuria and was started on a course of Cipro  which she started the following day and stated that he had been taking as prescribed.     CT chest that demonstrated multifocal pneumonia and small effusions.  Blood cultures ultimately grew MRSA.  He had been on vancomycin  which was continued. He had a left thora 12/10 with 450cc fluid removed.  His HD catheter was removed on 10/28/2024 as well as an arterial line.  He did have a TTE on 12/11 that showed an EF of 20-25%, G2 DD, mild decrease in RV SF, severe AAS, no vegetations.  TEE was recommended but had been deferred due to patient requiring vasopressor support.  ID was consulted and recommended line holiday.  He had a temporary femoral HD cath placed on 12/15.  ID recommendations included 6 weeks of vancomycin  to end on 12/09/2024.    12/17, he was transferred to [facility] for ongoing management for septic shock as well as for IR evaluation for tunneled HD catheter placement  Pertinent  Medical History  ESRD on HD MWF, Afib, HFrEF, T2DM, LBBB, severe PVD s/p R BKA Past Medical History:  Diagnosis Date   ESRD on hemodialysis (HCC)    a. HD MWF > 10 years.   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 01/2022 Echo: EF 45-50%, glob HK, mod LVH, mildly reduced RV fxn, mild MR, mod-sev MV Ca2+. Mild AS; b. 10/2023 Echo: EF 45-50%, glob HK, GrII DD, mildly reduced RV fxn, RVSP 52.34mmHg. Mild MR/MS, mod TR.   Hypertension    Nonischemic  cardiomyopathy (HCC)    a. 2019 Cath [facility]): Nonobstructive disease; b. 12/2019 Echo: EF 50%; c. 12/2019 MV (Tx w/u [facility]): Ant infarct w/ peri-infarct ischemia. EF 35%; c. 05/2021 MV (Tx w/u [facility]): Cor Ca2+, EF 53%, motion artifact, equiv study, likely low risk.   Nonobstructive coronary artery disease    a.  2019 Cath ([place]): Left main normal, LAD 30 proximal, RCA 30 distal, EF 25%.   Obesity    Peripheral arterial disease    Peripheral vascular disease    Type 2 diabetes mellitus (HCC)    a. 10/2023 - admission w/ HHS.   Significant Hospital Events: Including procedures, antibiotic start and stop dates in addition to other pertinent events   12/8 admit 12/10 L thora with 450cc removed, transudate, neg cultures 12/11 echo 12/12 HD cath and art line removed 12/15 new femoral HD cath placed 12/17 transfer to [facility] 29M  Interim History / Subjective:  Awake and alert. Denies any pain. Denies any further nausea or vomiting. Afebrile. On Room Air. Remains on levophed . HD today.   Objective    Blood pressure 121/64, pulse (!) 101, temperature 98.2 F (36.8 C), temperature source Oral, resp. rate (!) 29, height 6' 1 (1.854 m), weight 110.5 kg, SpO2 95%.        Intake/Output Summary (Last  24 hours) at 11/04/2024 0622 Last data filed at 11/04/2024 0500 Gross per 24 hour  Intake 1513.6 ml  Output --  Net 1513.6 ml   Filed Weights   10/28/24 1220 11/02/24 1003 11/02/24 1340  Weight: 105.3 kg 112 kg 110.5 kg    Examination:  General: Awake, chronically ill appearing, in no acute distress Lungs: normal effort on RA, no wheezing Cardiovascular: RRR Abdomen: bowel sounds present, soft, non-tender Extremities: LUE AVF (not functioning), R BKA, 1+ pitting edema of LLE Neuro: A&Ox3  Resolved problem list   Assessment and Plan   Neuro No acute issues. A&Ox3.   Respiratory Acute hypoxic respiratory failure 2/2 multifocal pneumonia, resolved Weaned to room air  and saturating well. Had thora 12/10 of left pleural effusion, suggestive of transudative process.  -consider bedside US  for left pleural effusion if able   Cardiac HFrEF 20-25% Severe Aortic stenosis 12/11 TTE EF 20-25%, G2DD, mild decrease RVF; severe AS, no vegetation. Seen by outpatient cardiology 12/2023 and inpatient in 11/2023, noted severe AS, plan is to be managed medically given multiple comorbidities. Did not tolerate DAPT in past so due to bleeding, remains just on ASA.  -GDMT limited in setting of ESRD and pressor need -Volume control w/ HD  -Continue outpatient cardiology f/u  Septic shock -Levophed , wean as BP allows, maintain MAP >65 -Midodrine  20 mg TID  -see ID section below  -Normal TSH, unremarkable cortisol -12/18 CXR w/ retrocardiac atelectasis vs pneumonia, on vanc -12/18 Bcx pending   GI Bowel: Miralax  PRN, LBM 12/17  ID Septic shock 2/2 MRSA bacteremia  12/8 Bcx grew MRSA, repeat Bcx 12/10 negative and Bcx 12/12 negative. Had line holiday w/ new L femoral HD cath placed 12/15. ID had followed while at [facility] and provided final recs.  -Vanco x 6 weeks (EOT 12/09/24) -ID f/u at [facility] on 12/26/24   Lines: PIV, L femoral HD  Endo  T2DM A1c 8.6 this month. CBGs improved to be at goal.  -Lantus  15 units  -Novolog  2 units w/ meals -SSI-Moderate w/ meals and at bedtime   Renal ESRD on HD MWF Secondary hyperparathyroidism Per most recent fistulogram, he has an occluded subclavian vein on the right but patent RIJ vein from May, 2025.  -Appreciate Nephrology assistance, HD 12/17 w/ 1.5 L removed via temp femoral HD cath (placed 12/15) -HD per nephrology, anticipate today  -Spoke w/ IR for [facility], will off of until out of ICU and/or off pressors, will need to reconsult them  -Continue cinacalcet  and sevelamer    Chronic Hyponatremia  Na 122 (125). Mentation at baseline. Noted weight gain since admission. Has gotten HD w/ last HD 12/17 w/ 1.5 L removed. I anticipate  removing volume will improve Na. HD planned for today.  -Trend sodium -HD today  Heme Microcytic anemia Anemia of chronic kidney disease Hgb steady 11-12. No signs of bleeding. Treating MRSA bacteremia currently.   Anticoagulation: heparin  SQ  MSK/Other Hx of PVD: No signs of infection at R BKA. On home ASA.   Labs   CBC: Recent Labs  Lab 10/28/24 0749 10/29/24 0343 11/02/24 0439 11/03/24 0501 11/04/24 0346  WBC 9.1 10.2 9.3 9.7 8.1  HGB 12.0* 12.4* 12.0* 11.8* 11.5*  HCT 36.2* 38.0* 36.7* 36.2* 34.9*  MCV 73.9* 73.8* 75.7* 75.4* 75.1*  PLT 208 219 388 380 335    Basic Metabolic Panel: Recent Labs  Lab 10/28/24 0749 10/29/24 0343 11/02/24 0439 11/03/24 0501 11/04/24 0346  NA 130* 130* 126* 125* 122*  K  3.9 3.9 4.7 4.4 4.6  CL 90* 91* 90* 88* 87*  CO2 25 25 18* 24 20*  GLUCOSE 234* 270* 326* 272* 114*  BUN 59* 38* 53* 40* 46*  CREATININE 10.90* 7.71* 8.24* 7.02* 7.86*  CALCIUM  7.8* 8.3* 7.8* 7.9* 7.8*  MG  --  2.4  --  1.9 1.9  PHOS 5.5*  --   --  4.7* 4.7*   GFR: Estimated Creatinine Clearance: 12.7 mL/min (A) (by C-G formula based on SCr of 7.86 mg/dL (H)). Recent Labs  Lab 10/29/24 0343 11/02/24 0439 11/03/24 0501 11/04/24 0346  WBC 10.2 9.3 9.7 8.1    Liver Function Tests: Recent Labs  Lab 10/28/24 0749 10/29/24 0343 11/02/24 0439  AST  --  31 66*  ALT  --  11 36  ALKPHOS  --  177* 353*  BILITOT  --  1.4* 1.3*  PROT  --  7.4 8.0  ALBUMIN  2.8* 2.8* 3.1*   No results for input(s): LIPASE, AMYLASE in the last 168 hours. No results for input(s): AMMONIA in the last 168 hours.  ABG    Component Value Date/Time   PHART 7.42 11/11/2023 1225   PCO2ART 36 11/11/2023 1225   PO2ART 59 (L) 11/11/2023 1225   HCO3 33.0 (H) 02/11/2024 1506   TCO2 27 03/30/2024 0757   ACIDBASEDEF 0.6 11/11/2023 1225   O2SAT 63.1 11/03/2024 0831     Coagulation Profile: No results for input(s): INR, PROTIME in the last 168 hours.  Cardiac  Enzymes: No results for input(s): CKTOTAL, CKMB, CKMBINDEX, TROPONINI in the last 168 hours.  HbA1C: Hgb A1c MFr Bld  Date/Time Value Ref Range Status  10/24/2024 10:14 AM 8.6 (H) 4.8 - 5.6 % Final    Comment:    (NOTE) Diagnosis of Diabetes The following HbA1c ranges recommended by the American Diabetes Association (ADA) may be used as an aid in the diagnosis of diabetes mellitus.  Hemoglobin             Suggested A1C NGSP%              Diagnosis  <5.7                   Non Diabetic  5.7-6.4                Pre-Diabetic  >6.4                   Diabetic  <7.0                   Glycemic control for                       adults with diabetes.    02/11/2024 02:27 PM 8.4 (H) 4.8 - 5.6 % Final    Comment:    (NOTE) Pre diabetes:          5.7%-6.4%  Diabetes:              >6.4%  Glycemic control for   <7.0% adults with diabetes     CBG: Recent Labs  Lab 11/03/24 1512 11/03/24 1703 11/03/24 2102 11/04/24 0304 11/04/24 0324  GLUCAP 206* 143* 101* 111* 132*    Review of Systems:   As above   Past Medical History:  He,  has a past medical history of ESRD on hemodialysis (HCC), Heart failure with mid-range ejection fraction (HCC), Hypertension, Nonischemic cardiomyopathy (HCC), Nonobstructive coronary artery disease, Obesity, Peripheral arterial disease,  Peripheral vascular disease, and Type 2 diabetes mellitus (HCC).   Surgical History:   Past Surgical History:  Procedure Laterality Date   A/V FISTULAGRAM N/A 08/15/2019   Procedure: A/V FISTULAGRAM;  Surgeon: Sheree Penne Bruckner, MD;  Location: [facility] INVASIVE CV LAB;  Service: Cardiovascular;  Laterality: N/A;   A/V FISTULAGRAM N/A 01/21/2024   Procedure: A/V Fistulagram;  Surgeon: Melia Lynwood ORN, MD;  Location: [facility] INVASIVE CV LAB;  Service: Cardiovascular;  Laterality: N/A;   AMPUTATION Right 02/02/2022   Procedure: AMPUTATION 5th RAY;  Surgeon: Joya Stabs, MD;  Location: [facility] OR;  Service: Podiatry;   Laterality: Right;   AMPUTATION Right 05/23/2022   Procedure: RIGHT BELOW KNEE AMPUTATION;  Surgeon: Harden Jerona GAILS, MD;  Location: [facility] OR;  Service: Orthopedics;  Laterality: Right;   AMPUTATION Right 07/04/2022   Procedure: REVISION AMPUTATION BELOW KNEE;  Surgeon: Harden Jerona GAILS, MD;  Location: [facility] OR;  Service: Orthopedics;  Laterality: Right;   APPLICATION OF WOUND VAC  05/23/2022   Procedure: APPLICATION OF WOUND VAC;  Surgeon: Harden Jerona GAILS, MD;  Location: [facility] OR;  Service: Orthopedics;;   AV FISTULA PLACEMENT     CARDIAC CATHETERIZATION  02/17/2018   LHC 02/17/18 ([name]): 30% pLAD, 30% dRCA, LVEDP 25-30, LVEF 25%.     CATARACT EXTRACTION W/PHACO Right 03/29/2020   Procedure: CATARACT EXTRACTION PHACO AND INTRAOCULAR LENS PLACEMENT (IOC)  RIGHT DIABETIC;  Surgeon: Mittie Gaskin, MD;  Location: [facility] ORS;  Service: Ophthalmology;  Laterality: Right;  Lot # [id number] H US : 02:30.9 AP% 9.5% CDE: 14.38   EYE SURGERY Right    cataract removed   [name] N/A 12/02/2023   Procedure: [name];  Surgeon: Federico Rosario BROCKS, MD;  Location: [facility] ENDOSCOPY;  Service: Gastroenterology;  Laterality: N/A;   I & D EXTREMITY Right 02/02/2022   Procedure: IRRIGATION AND DEBRIDEMENT RIGHT FOOT;  Surgeon: Joya Stabs, MD;  Location: [facility] OR;  Service: Podiatry;  Laterality: Right;   INCISION AND DRAINAGE OF WOUND Right 02/08/2022   Procedure: IRRIGATION AND DEBRIDEMENT WOUND;  Surgeon: Burt Fus, DPM;  Location: [facility] OR;  Service: Podiatry;  Laterality: Right;  with removal of infected bone    INSERTION OF DIALYSIS CATHETER  08/13/2019   Procedure: Insertion Of Dialysis Catheter;  Surgeon: Oris Krystal FALCON, MD;  Location: [facility] OR;  Service: Vascular;;   INSERTION OF DIALYSIS CATHETER Left 07/19/2020   Procedure: INSERTION OF DIALYSIS CATHETER;  Surgeon: Sheree Penne Bruckner, MD;  Location: [facility] OR;  Service: Vascular;  Laterality: Left;   IR FLUORO GUIDE CV LINE LEFT  02/16/2024    IR LUMBAR PUNCTURE  11/20/2023   IR US  GUIDE BX ASP/DRAIN  11/07/2022   IR US  GUIDE VASC ACCESS LEFT  02/16/2024   IRRIGATION AND DEBRIDEMENT ABSCESS     PERIPHERAL VASCULAR BALLOON ANGIOPLASTY  08/15/2019   Procedure: PERIPHERAL VASCULAR BALLOON ANGIOPLASTY;  Surgeon: Sheree Penne Bruckner, MD;  Location: [facility] INVASIVE CV LAB;  Service: Cardiovascular;;   PERIPHERAL VASCULAR BALLOON ANGIOPLASTY Left 02/06/2022   Procedure: PERIPHERAL VASCULAR BALLOON ANGIOPLASTY;  Surgeon: Gretta Bruckner PARAS, MD;  Location: [facility] INVASIVE CV LAB;  Service: Cardiovascular;  Laterality: Left;   PERIPHERAL VASCULAR BALLOON ANGIOPLASTY  01/21/2024   Procedure: PERIPHERAL VASCULAR BALLOON ANGIOPLASTY;  Surgeon: Melia Lynwood ORN, MD;  Location: [facility] INVASIVE CV LAB;  Service: Cardiovascular;;  Cephalic Arch   RADIOLOGY WITH ANESTHESIA N/A 11/20/2023   Procedure: MRI WITH ANESTHESIA;  Surgeon: Radiologist, Medication, MD;  Location: [facility] OR;  Service: Radiology;  Laterality:  N/A;   RADIOLOGY WITH ANESTHESIA N/A 11/20/2023   Procedure: IR WITH ANESTHESIA;  Surgeon: Alona Corners, DO;  Location: [facility] OR;  Service: Anesthesiology;  Laterality: N/A;  PLEASE OBTAIN OPENING PRESSURE AND DRAIN AT LEAST 30CC'S OF CSF.   REVISON OF ARTERIOVENOUS FISTULA Left 07/19/2020   Procedure: REVISON/PLICATION OF ARTERIOVENOUS FISTULA LEFT;  Surgeon: Sheree Penne Bruckner, MD;  Location: [facility] OR;  Service: Vascular;  Laterality: Left;   THROMBECTOMY AND REVISION OF ARTERIOVENTOUS (AV) GORETEX  GRAFT Left 08/13/2019   Procedure: THROMBECTOMY OF LEFT ARM ARTERIOVENTOUS (AV) FISTULA;  Surgeon: Oris Krystal FALCON, MD;  Location: [facility] OR;  Service: Vascular;  Laterality: Left;   TOE AMPUTATION     UPPER EXTREMITY VENOGRAPHY Bilateral 03/30/2024   Procedure: UPPER EXTREMITY VENOGRAPHY;  Surgeon: Lanis Fonda BRAVO, MD;  Location: [facility] INVASIVE CV LAB;  Service: Cardiovascular;  Laterality: Bilateral;     Social History:   reports that he has never smoked. He has never used  smokeless tobacco. He reports current alcohol use. He reports that he does not use drugs.   Family History:  His family history includes Hypertension in his mother.   Allergies Allergies[1]   Home Medications  Prior to Admission medications  Medication Sig Start Date End Date Taking? Authorizing Provider  acetaminophen  (TYLENOL ) 500 MG tablet Take 1,000 mg by mouth 2 (two) times daily as needed for moderate pain (pain score 4-6), headache or fever.   Yes [provider]  aspirin  EC 81 MG tablet Take 1 tablet (81 mg total) by mouth daily with breakfast. Swallow whole. 02/17/24  Yes Emokpae, Courage, MD  cinacalcet  (SENSIPAR ) 30 MG tablet Take 30 mg by mouth daily.   Yes [provider]  ciprofloxacin  (CIPRO ) 500 MG tablet Take 1 tablet (500 mg total) by mouth 2 (two) times daily. One po bid x 7 days 10/18/24  Yes Delo, Vicenta, MD  doxycycline  (VIBRAMYCIN ) 100 MG capsule Take 1 capsule (100 mg total) by mouth 2 (two) times daily. One po bid x 7 days 10/16/24  Yes Zammit, Joseph, MD  gabapentin  (NEURONTIN ) 300 MG capsule Take 300 mg by mouth daily.   Yes [provider]  insulin  isophane & regular human KwikPen (NOVOLIN  70/30 KWIKPEN) (70-30) 100 UNIT/ML KwikPen Inject 30U in the morning and 25U in the evening Patient taking differently: Inject 25-30 Units into the skin See admin instructions. Inject 30 Units in the morning and 25 Units in the evening 10/27/23  Yes Ezenduka, Nkeiruka J, MD  midodrine  (PROAMATINE ) 10 MG tablet Take 10 mg by mouth 3 (three) times daily. 06/10/24  Yes [provider]  vancomycin  IVPB Inject 1,000 mg into the vein every Monday, Wednesday, and Friday with hemodialysis. Indication:  MRSA Bacteremia Last Day of Therapy:  12/09/24 Labs - Once weekly: Vanc Trough, CBC 10/31/24 12/09/24 Yes Vu, Constance DASEN, MD  Insulin  Pen Needle 32G X 4 MM MISC Use with insulin  pen. 10/28/23   Ezenduka, Nkeiruka J, MD  pantoprazole  (PROTONIX ) 40 MG tablet Take  1 tablet (40 mg total) by mouth 2 (two) times daily. Patient not taking: Reported on 10/24/2024 01/13/24 06/07/24  Verdene Purchase, MD     Critical care time:                [1]  Allergies Allergen Reactions   Sulfa Antibiotics Diarrhea and Nausea And Vomiting   "

## 2024-11-05 DIAGNOSIS — R6521 Severe sepsis with septic shock: Secondary | ICD-10-CM | POA: Diagnosis not present

## 2024-11-05 DIAGNOSIS — I5022 Chronic systolic (congestive) heart failure: Secondary | ICD-10-CM | POA: Diagnosis not present

## 2024-11-05 DIAGNOSIS — I132 Hypertensive heart and chronic kidney disease with heart failure and with stage 5 chronic kidney disease, or end stage renal disease: Secondary | ICD-10-CM | POA: Diagnosis not present

## 2024-11-05 DIAGNOSIS — D631 Anemia in chronic kidney disease: Secondary | ICD-10-CM | POA: Diagnosis not present

## 2024-11-05 DIAGNOSIS — A4102 Sepsis due to Methicillin resistant Staphylococcus aureus: Secondary | ICD-10-CM | POA: Diagnosis not present

## 2024-11-05 DIAGNOSIS — E1122 Type 2 diabetes mellitus with diabetic chronic kidney disease: Secondary | ICD-10-CM | POA: Diagnosis not present

## 2024-11-05 DIAGNOSIS — N186 End stage renal disease: Secondary | ICD-10-CM | POA: Diagnosis not present

## 2024-11-05 DIAGNOSIS — I350 Nonrheumatic aortic (valve) stenosis: Secondary | ICD-10-CM | POA: Diagnosis not present

## 2024-11-05 LAB — RENAL FUNCTION PANEL
Albumin: 3 g/dL — ABNORMAL LOW (ref 3.5–5.0)
Anion gap: 16 — ABNORMAL HIGH (ref 5–15)
BUN: 36 mg/dL — ABNORMAL HIGH (ref 8–23)
CO2: 21 mmol/L — ABNORMAL LOW (ref 22–32)
Calcium: 7.9 mg/dL — ABNORMAL LOW (ref 8.9–10.3)
Chloride: 86 mmol/L — ABNORMAL LOW (ref 98–111)
Creatinine, Ser: 6.77 mg/dL — ABNORMAL HIGH (ref 0.61–1.24)
GFR, Estimated: 9 mL/min — ABNORMAL LOW
Glucose, Bld: 123 mg/dL — ABNORMAL HIGH (ref 70–99)
Phosphorus: 4.6 mg/dL (ref 2.5–4.6)
Potassium: 4.4 mmol/L (ref 3.5–5.1)
Sodium: 122 mmol/L — ABNORMAL LOW (ref 135–145)

## 2024-11-05 LAB — GLUCOSE, CAPILLARY
Glucose-Capillary: 204 mg/dL — ABNORMAL HIGH (ref 70–99)
Glucose-Capillary: 136 mg/dL — ABNORMAL HIGH (ref 70–99)
Glucose-Capillary: 121 mg/dL — ABNORMAL HIGH (ref 70–99)
Glucose-Capillary: 88 mg/dL (ref 70–99)

## 2024-11-05 LAB — MAGNESIUM: Magnesium: 1.8 mg/dL (ref 1.7–2.4)

## 2024-11-05 LAB — CBC
HCT: 35 % — ABNORMAL LOW (ref 39.0–52.0)
Hemoglobin: 11.6 g/dL — ABNORMAL LOW (ref 13.0–17.0)
MCH: 24.9 pg — ABNORMAL LOW (ref 26.0–34.0)
MCHC: 33.1 g/dL (ref 30.0–36.0)
MCV: 75.3 fL — ABNORMAL LOW (ref 80.0–100.0)
Platelets: 342 K/uL (ref 150–400)
RBC: 4.65 MIL/uL (ref 4.22–5.81)
RDW: 24 % — ABNORMAL HIGH (ref 11.5–15.5)
WBC: 8 K/uL (ref 4.0–10.5)
nRBC: 0 % (ref 0.0–0.2)

## 2024-11-05 LAB — BASIC METABOLIC PANEL WITH GFR
Anion gap: 17 — ABNORMAL HIGH (ref 5–15)
BUN: 36 mg/dL — ABNORMAL HIGH (ref 8–23)
CO2: 19 mmol/L — ABNORMAL LOW (ref 22–32)
Calcium: 7.7 mg/dL — ABNORMAL LOW (ref 8.9–10.3)
Chloride: 87 mmol/L — ABNORMAL LOW (ref 98–111)
Creatinine, Ser: 6.63 mg/dL — ABNORMAL HIGH (ref 0.61–1.24)
GFR, Estimated: 9 mL/min — ABNORMAL LOW
Glucose, Bld: 189 mg/dL — ABNORMAL HIGH (ref 70–99)
Potassium: 4.5 mmol/L (ref 3.5–5.1)
Sodium: 122 mmol/L — ABNORMAL LOW (ref 135–145)

## 2024-11-05 LAB — PHOSPHORUS: Phosphorus: 4.2 mg/dL (ref 2.5–4.6)

## 2024-11-05 MED ORDER — PRISMASOL BGK 4/2.5 32-4-2.5 MEQ/L EC SOLN: Status: DC

## 2024-11-05 MED ORDER — NOREPINEPHRINE 16 MG/250ML-% IV SOLN
0.0000 ug/min | INTRAVENOUS | Status: DC
  Administered 2024-11-05 – 2024-11-06 (×2): 16 ug/min via INTRAVENOUS
  Administered 2024-11-07: 4 ug/min via INTRAVENOUS
  Administered 2024-11-08: 2 ug/min via INTRAVENOUS
  Filled 2024-11-05 (×3): qty 250

## 2024-11-05 MED ORDER — VANCOMYCIN HCL 1250 MG/250ML IV SOLN
1250.0000 mg | INTRAVENOUS | Status: DC
  Administered 2024-11-05 – 2024-11-07 (×3): 1250 mg via INTRAVENOUS
  Filled 2024-11-05 (×3): qty 250

## 2024-11-05 MED ORDER — SODIUM CHLORIDE 0.9 % IV SOLN
500.0000 [IU]/h | INTRAVENOUS | Status: DC
  Administered 2024-11-05: 500 [IU]/h via INTRAVENOUS_CENTRAL
  Administered 2024-11-06 – 2024-11-07 (×4): 1000 [IU]/h via INTRAVENOUS_CENTRAL
  Filled 2024-11-05: qty 2
  Filled 2024-11-05 (×4): qty 10000
  Filled 2024-11-05: qty 2

## 2024-11-05 MED ORDER — HEPARIN SODIUM (PORCINE) 1000 UNIT/ML DIALYSIS
1000.0000 [IU] | INTRAMUSCULAR | Status: DC | PRN
  Administered 2024-11-07: 3000 [IU] via INTRAVENOUS_CENTRAL
  Filled 2024-11-05 (×2): qty 6
  Filled 2024-11-05 (×2): qty 3

## 2024-11-05 MED ORDER — VASOPRESSIN 20 UNITS/100 ML INFUSION FOR SHOCK
0.0000 [IU]/min | INTRAVENOUS | Status: DC
  Administered 2024-11-05 – 2024-11-07 (×5): 0.03 [IU]/min via INTRAVENOUS
  Filled 2024-11-05 (×5): qty 100

## 2024-11-05 NOTE — Progress Notes (Signed)
 " [facility] Progress Note   Subjective:    1.8 L in yest and 3 L off w/ HD Levo up to 20 overnight , 13 this am, and vaso gtt added  Objective Vitals:   11/05/24 0615 11/05/24 0630 11/05/24 0645 11/05/24 0700  BP: (!) 50/36 (!) 125/52 120/63 (!) 120/48  Pulse: (!) 104 (!) 105 (!) 103 (!) 104  Resp: (!) 30 (!) 23 (!) 30 (!) 25  Temp:      TempSrc:      SpO2: 98% 97% 100% 97%  Weight:      Height:       Physical Exam General: Chronically ill appearing, NAD. Room air Heart: RRR Lungs: CTA anteriorly Abdomen: soft Extremities: no LE edema Dialysis Access: L femoral temp cath  Dialysis Orders: MWF [facility] 4h  B400  101.9kg  2K bath  TDC  Hep 6000 - Hectoral 5mcg IV q HD - No ESA/iron   Assessment/Plan: MRSA bacteremia: Blood Cx 12/8 +, repeat 12/10, 12/12, 12/18 negative. On Vancomycin . TDC out, s/p temp line 12/15. IR consulted for new line - plan is wait until off pressors and out of ICU. Multifocal pneumonia: Ceftriaxone  added, pleural fluid Cx 12/10 negative. ESRD: Usual MWF. Had HD yesterday. Will need CRRT for vol overload.  HypoTN/volume: Chronic low BP on mido 20mg  TID, also on  2 pressors now and Na+ down to 122. HypoNa indicative of vol overload. Will need CRRT, have d/w CCM.  Anemia of ESRD: Hgb remains > 11, no ESA needed Secondary HPTH: CorrCa/Phos ok - continue home meds. Nutrition: Alb low, add supps T2DM: Insulin  per primary. HFrEF (EF 20-25% at baseline)   [name]  MD  [facility] 11/05/2024, 10:41 AM  Recent Labs  Lab 11/02/24 0439 11/03/24 0501 11/04/24 0346 11/04/24 2238 11/05/24 0316  HGB 12.0*   < > 11.5*  --  11.6*  ALBUMIN  3.1*  --   --  2.8*  --   CALCIUM  7.8*   < > 7.8* 7.9* 7.7*  PHOS  --    < > 4.7* 3.9 4.2  CREATININE 8.24*   < > 7.86* 6.37* 6.63*  K 4.7   < > 4.6 4.2 4.5   < > = values in this interval not displayed.    Inpatient medications:  aspirin  EC  81 mg Oral Q breakfast   Chlorhexidine  Gluconate Cloth  6  each Topical Q0600   Chlorhexidine  Gluconate Cloth  6 each Topical Q0600   cinacalcet   60 mg Oral Q breakfast   guaiFENesin   600 mg Oral BID   heparin   4,000 Units Dialysis Once in dialysis   heparin   5,000 Units Subcutaneous Q8H   insulin  aspart  0-15 Units Subcutaneous TID WC   insulin  aspart  0-5 Units Subcutaneous QHS   insulin  aspart  2 Units Subcutaneous TID WC   insulin  glargine  15 Units Subcutaneous Daily   midodrine   20 mg Oral TID WC   sevelamer  carbonate  4,000 mg Oral TID WC    anticoagulant sodium citrate      anticoagulant sodium citrate      cefTRIAXone  (ROCEPHIN )  IV 2 g (11/05/24 1009)   norepinephrine  (LEVOPHED ) Adult infusion 13 mcg/min (11/05/24 1007)   vancomycin  Stopped (11/04/24 2251)   vasopressin  0.03 Units/min (11/05/24 0901)   acetaminophen  **OR** acetaminophen , alteplase , anticoagulant sodium citrate , anticoagulant sodium citrate , feeding supplement (NEPRO CARB STEADY), guaiFENesin -dextromethorphan , heparin , heparin , heparin , heparin , heparin , ipratropium-albuterol , lidocaine  (PF), lidocaine  (PF), lidocaine -prilocaine , lidocaine -prilocaine , mouth rinse, pentafluoroprop-tetrafluoroeth, pentafluoroprop-tetrafluoroeth, phenol, polyethylene  glycol, trimethobenzamide           "

## 2024-11-05 NOTE — Progress Notes (Signed)
 Pharmacy Antibiotic Note  [name] [name]. is a 63 y.o. male admitted on 10/24/2024 with MRSA bacteremia.  Pharmacy has been consulted for vancomycin  dosing.  Plan: Patient is now on CRRT- switch from iHD dosing for vancomycin  to CRRT dosing Start Vancomycin  1250 mg q24h IV at 2000.  Level at steady state in 3-5 days.   Height: 6' 1 (185.4 cm) Weight: 114.8 kg (253 lb 1.4 oz) IBW/kg (Calculated) : 79.9  Temp (24hrs), Avg:98.9 F (37.2 C), Min:98.6 F (37 C), Max:99.3 F (37.4 C)  Recent Labs  Lab 11/02/24 0439 11/03/24 0501 11/04/24 0346 11/04/24 2238 11/05/24 0316  WBC 9.3 9.7 8.1  --  8.0  CREATININE 8.24* 7.02* 7.86* 6.37* 6.63*  VANCOTROUGH 25*  --   --   --   --     Estimated Creatinine Clearance: 15.3 mL/min (A) (by C-G formula based on SCr of 6.63 mg/dL (H)).    Allergies[1]  Antimicrobials this admission: Vanc 12/8>(12/09/24) CTX/azith 12/8 x1; 12/19 > (12/23)  Cefepime  12/8  Dose adjustments this admission: 12/20 CRRT started, dose adjusted vancomycin    Microbiology results: 12/18 BCX 12/12 BCX: NGx5 12/10 pleural fluid no orgs 12/10 Bcx: NGx5 12/8 Bcx: 4/4 MRSA MRSA PCR: +  Thank you for allowing pharmacy to be a part of this patients care.  Alexiz Sustaita M Bernice Mullin 11/05/2024 1:19 PM     [1]  Allergies Allergen Reactions   Sulfa Antibiotics Diarrhea and Nausea And Vomiting

## 2024-11-05 NOTE — Plan of Care (Signed)
  Problem: Education: Goal: Knowledge of General Education information will improve Description: Including pain rating scale, medication(s)/side effects and non-pharmacologic comfort measures Outcome: Progressing   Problem: Health Behavior/Discharge Planning: Goal: Ability to manage health-related needs will improve Outcome: Progressing   Problem: Activity: Goal: Risk for activity intolerance will decrease Outcome: Progressing   Problem: Nutrition: Goal: Adequate nutrition will be maintained Outcome: Progressing   Problem: Elimination: Goal: Will not experience complications related to bowel motility Outcome: Progressing   

## 2024-11-05 NOTE — Progress Notes (Addendum)
 "  NAME:  [name] [name]., MRN:  [id number], DOB:  [birth year], LOS: 12 ADMISSION DATE:  10/24/2024, CONSULTATION DATE:  12/17 REFERRING MD:  Ricky - [facility], CHIEF COMPLAINT:  hypotension  History of Present Illness:  [name] ARMENDARIZ Sr. is a 63 y.o. male who has a PMH as outlined below including but not limited to ESRD on HD MWF, A-fib, systolic CHF, hypertension, LBBB, DM.  He presented to [facility] ED on 10/24/2024 with dyspnea x roughly 2-3 weeks. He had been seen in the ED a few days early on 12/2 for dysuria and was started on a course of Cipro  which she started the following day and stated that he had been taking as prescribed.     CT chest that demonstrated multifocal pneumonia and small effusions.  Blood cultures ultimately grew MRSA.  He had been on vancomycin  which was continued. He had a left thora 12/10 with 450cc fluid removed.  His HD catheter was removed on 10/28/2024 as well as an arterial line.  He did have a TTE on 12/11 that showed an EF of 20-25%, G2 DD, mild decrease in RV SF, severe AAS, no vegetations.  TEE was recommended but had been deferred due to patient requiring vasopressor support.  ID was consulted and recommended line holiday.  He had a temporary femoral HD cath placed on 12/15.  ID recommendations included 6 weeks of vancomycin  to end on 12/09/2024.    12/17, he was transferred to [facility] for ongoing management for septic shock as well as for IR evaluation for tunneled HD catheter placement  Pertinent  Medical History  ESRD on HD MWF, Afib, HFrEF, T2DM, LBBB, severe PVD s/p R BKA Past Medical History:  Diagnosis Date   ESRD on hemodialysis (HCC)    a. HD MWF > 10 years.   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 01/2022 Echo: EF 45-50%, glob HK, mod LVH, mildly reduced RV fxn, mild MR, mod-sev MV Ca2+. Mild AS; b. 10/2023 Echo: EF 45-50%, glob HK, GrII DD, mildly reduced RV fxn, RVSP 52.60mmHg. Mild MR/MS, mod TR.   Hypertension    Nonischemic  cardiomyopathy (HCC)    a. 2019 Cath [facility]): Nonobstructive disease; b. 12/2019 Echo: EF 50%; c. 12/2019 MV (Tx w/u [facility]): Ant infarct w/ peri-infarct ischemia. EF 35%; c. 05/2021 MV (Tx w/u [facility]): Cor Ca2+, EF 53%, motion artifact, equiv study, likely low risk.   Nonobstructive coronary artery disease    a.  2019 Cath ([place] [facility]): Left main normal, LAD 30 proximal, RCA 30 distal, EF 25%.   Obesity    Peripheral arterial disease    Peripheral vascular disease    Type 2 diabetes mellitus (HCC)    a. 10/2023 - admission w/ HHS.   Significant Hospital Events: Including procedures, antibiotic start and stop dates in addition to other pertinent events   12/8 admit 12/10 L thora with 450cc removed, transudate, neg cultures 12/11 echo 12/12 HD cath and art line removed 12/15 new femoral HD cath placed 12/17 transfer to [facility] 11M 12/19 HD   Interim History / Subjective:  Tolerated HD with 3L removed Transiently required increased levophed  during dialysis overnight Objective    Blood pressure (!) 120/48, pulse (!) 104, temperature 99.1 F (37.3 C), temperature source Oral, resp. rate (!) 25, height 6' 1 (1.854 m), weight 113.1 kg, SpO2 97%.        Intake/Output Summary (Last 24 hours) at 11/05/2024 9277 Last data  filed at 11/05/2024 0600 Gross per 24 hour  Intake 1876.86 ml  Output 3050 ml  Net -1173.14 ml   Filed Weights   11/04/24 1422 11/04/24 1939 11/05/24 0412  Weight: 113 kg 110 kg 113.1 kg   Physical Exam: General: Well-appearing, no acute distress HENT: [place], AT, OP clear, MMM Eyes: EOMI, no scleral icterus Respiratory: Diminished air entry but clear to auscultation bilaterally.  No crackles, wheezing or rales Cardiovascular: RRR, -M/R/G, no JVD GI: BS+, soft, nontender Extremities: R BKA, 1+ pitting edema of LLE,-tenderness Neuro: AAO x4, CNII-XII grossly intact Skin: Intact, no rashes or bruising Psych: Normal mood, normal affect  Imaging, labs and  test in EMR in the last 24 hours reviewed independently by me. Pertinent findings below: CXR 12/18 retrocardiac infiltrate, small left pleural effusion Na 122 CO2 19 Hg 11.6 stable   Resolved problem list   Assessment and Plan   Neuro No acute issues. A&Ox3.   Respiratory Acute hypoxic respiratory failure 2/2 multifocal pneumonia, resolved Weaned to room air and saturating well. Had thora 12/10 of left pleural effusion, suggestive of transudative process.  -consider bedside US  for left pleural effusion worsening hypoxemia -CXR tomorrow  Cardiac HFrEF 20-25% Severe Aortic stenosis 12/11 TTE EF 20-25%, G2DD, mild decrease RVF; severe AS, no vegetation. Seen by outpatient cardiology 12/2023 and inpatient in 11/2023, noted severe AS, plan is to be managed medically given multiple comorbidities. Did not tolerate DAPT in past so due to bleeding, remains just on ASA.  -GDMT limited in setting of ESRD and pressor need -Volume removal via HD  -Continue outpatient cardiology f/u  Septic shock 2/2 MRSA bacteremia -Wean levophed  maintain MAP >65. Worsening hypotension during dialysis. Remains on high dose levophed  -Add vasopressin  -Midodrine  20 mg TID  -see ID section below  -Normal TSH, unremarkable cortisol -12/18 CXR w/ retrocardiac atelectasis vs pneumonia, on vanc -12/18 Bcx NGTD  GI Bowel: Miralax  PRN, LBM 12/17  ID Septic shock 2/2 MRSA bacteremia, pneumonia: worsening pressor requirement 12/8 Bcx grew MRSA, repeat Bcx 12/10 negative and Bcx 12/12 negative. Had line holiday w/ new L femoral HD cath placed 12/15. ID had followed while at [facility] and provided final recs.  -Vanc x 6 weeks (End date: 12/09/24) -Ceftriaxone  12/19-12/24 -ID f/u at [facility] on 12/26/24 -Will need TEE when stable   Lines: PIV, L femoral HD  Endo  T2DM A1c 8.6 this month. CBGs improved to be at goal.  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  -Appreciate Nephrology assistance, HD 12/17 w/ 1.5 L removed via temp femoral HD cath (placed 12/15) -HD per nephrology, anticipate today  -Spoke w/ IR for [facility], will off of until out of ICU and/or off pressors, will need to reconsult them  -Continue cinacalcet  and sevelamer    Chronic Hyponatremia  Na 122 (125). Mentation at baseline. Noted weight gain since admission. Has gotten HD w/ last HD 12/17 w/ 1.5 L removed. Volume overload likely contributing -Trend sodium -HD per schedule  Heme Microcytic anemia Anemia of chronic kidney disease Hgb steady 11-12. No signs of bleeding. Treating MRSA bacteremia currently.   Anticoagulation: heparin  SQ  MSK/Other Hx of PVD: No signs of infection at R BKA. On home ASA.   Critical care time:     The patient is critically ill  with multiple organ systems failure and requires high complexity decision making for assessment and support, frequent evaluation and titration of therapies, application of advanced monitoring technologies and extensive interpretation of multiple databases.  Independent Critical Care Time: 36 Minutes.   Slater Staff, M.D. [facility] Pulmonary/Critical Care Medicine 11/05/2024 7:22 AM   Please see Amion for pager number to reach on-call Pulmonary and Critical Care Team.        "

## 2024-11-05 NOTE — Plan of Care (Signed)
 " Problem: Education: Goal: Knowledge of General Education information will improve Description: Including pain rating scale, medication(s)/side effects and non-pharmacologic comfort measures Outcome: Progressing   Problem: Health Behavior/Discharge Planning: Goal: Ability to manage health-related needs will improve Outcome: Progressing   Problem: Clinical Measurements: Goal: Ability to maintain clinical measurements within normal limits will improve Outcome: Progressing Goal: Will remain free from infection Outcome: Progressing Goal: Diagnostic test results will improve Outcome: Progressing Goal: Respiratory complications will improve Outcome: Progressing Goal: Cardiovascular complication will be avoided Outcome: Progressing   Problem: Activity: Goal: Risk for activity intolerance will decrease Outcome: Progressing   Problem: Nutrition: Goal: Adequate nutrition will be maintained Outcome: Progressing   Problem: Elimination: Goal: Will not experience complications related to bowel motility Outcome: Progressing Goal: Will not experience complications related to urinary retention Outcome: Progressing   Problem: Pain Managment: Goal: General experience of comfort will improve and/or be controlled Outcome: Progressing   Problem: Safety: Goal: Ability to remain free from injury will improve Outcome: Progressing   Problem: Skin Integrity: Goal: Risk for impaired skin integrity will decrease Outcome: Progressing   Problem: Activity: Goal: Ability to tolerate increased activity will improve Outcome: Progressing   Problem: Clinical Measurements: Goal: Ability to maintain a body temperature in the normal range will improve Outcome: Progressing   Problem: Respiratory: Goal: Ability to maintain adequate ventilation will improve Outcome: Progressing Goal: Ability to maintain a clear airway will improve Outcome: Progressing   Problem: Education: Goal: Ability to  describe self-care measures that may prevent or decrease complications (Diabetes Survival Skills Education) will improve Outcome: Progressing Goal: Individualized Educational Video(s) Outcome: Progressing   Problem: Coping: Goal: Ability to adjust to condition or change in health will improve Outcome: Progressing   Problem: Fluid Volume: Goal: Ability to maintain a balanced intake and output will improve Outcome: Progressing   Problem: Health Behavior/Discharge Planning: Goal: Ability to identify and utilize available resources and services will improve Outcome: Progressing Goal: Ability to manage health-related needs will improve Outcome: Progressing   Problem: Metabolic: Goal: Ability to maintain appropriate glucose levels will improve Outcome: Progressing   Problem: Nutritional: Goal: Maintenance of adequate nutrition will improve Outcome: Progressing Goal: Progress toward achieving an optimal weight will improve Outcome: Progressing   Problem: Skin Integrity: Goal: Risk for impaired skin integrity will decrease Outcome: Progressing   Problem: Tissue Perfusion: Goal: Adequacy of tissue perfusion will improve Outcome: Progressing   Problem: Education: Goal: Knowledge of disease and its progression will improve Outcome: Progressing Goal: Individualized Educational Video(s) Outcome: Progressing   Problem: Fluid Volume: Goal: Compliance with measures to maintain balanced fluid volume will improve Outcome: Progressing   Problem: Health Behavior/Discharge Planning: Goal: Ability to manage health-related needs will improve Outcome: Progressing   Problem: Nutritional: Goal: Ability to make healthy dietary choices will improve Outcome: Progressing   Problem: Clinical Measurements: Goal: Complications related to the disease process, condition or treatment will be avoided or minimized Outcome: Progressing   Problem: Education: Goal: Knowledge of disease or  condition will improve Outcome: Progressing Goal: Understanding of medication regimen will improve Outcome: Progressing Goal: Individualized Educational Video(s) Outcome: Progressing   Problem: Activity: Goal: Ability to tolerate increased activity will improve Outcome: Progressing   Problem: Cardiac: Goal: Ability to achieve and maintain adequate cardiopulmonary perfusion will improve Outcome: Progressing   Problem: Health Behavior/Discharge Planning: Goal: Ability to safely manage health-related needs after discharge will improve Outcome: Progressing   Problem: Education: Goal: Ability to demonstrate management of disease process will improve Outcome: Progressing Goal:  Ability to verbalize understanding of medication therapies will improve Outcome: Progressing Goal: Individualized Educational Video(s) Outcome: Progressing   Problem: Activity: Goal: Capacity to carry out activities will improve Outcome: Progressing   "

## 2024-11-05 NOTE — Plan of Care (Signed)
" °  Problem: Clinical Measurements: Goal: Ability to maintain clinical measurements within normal limits will improve Outcome: Progressing   Problem: Clinical Measurements: Goal: Will remain free from infection Outcome: Progressing   Problem: Clinical Measurements: Goal: Diagnostic test results will improve Outcome: Progressing   Problem: Clinical Measurements: Goal: Respiratory complications will improve Outcome: Progressing   Problem: Fluid Volume: Goal: Ability to maintain a balanced intake and output will improve Outcome: Progressing   Problem: Metabolic: Goal: Ability to maintain appropriate glucose levels will improve Outcome: Progressing   Problem: Cardiac: Goal: Ability to achieve and maintain adequate cardiopulmonary perfusion will improve Outcome: Progressing    Plan of care, assessment, monitoring, treatment, and intervention (s) ongoing, see MAR see flowsheet "

## 2024-11-06 ENCOUNTER — Inpatient Hospital Stay (HOSPITAL_COMMUNITY)

## 2024-11-06 DIAGNOSIS — D631 Anemia in chronic kidney disease: Secondary | ICD-10-CM | POA: Diagnosis not present

## 2024-11-06 DIAGNOSIS — A4102 Sepsis due to Methicillin resistant Staphylococcus aureus: Secondary | ICD-10-CM | POA: Diagnosis not present

## 2024-11-06 DIAGNOSIS — I132 Hypertensive heart and chronic kidney disease with heart failure and with stage 5 chronic kidney disease, or end stage renal disease: Secondary | ICD-10-CM | POA: Diagnosis not present

## 2024-11-06 DIAGNOSIS — I350 Nonrheumatic aortic (valve) stenosis: Secondary | ICD-10-CM | POA: Diagnosis not present

## 2024-11-06 DIAGNOSIS — I5022 Chronic systolic (congestive) heart failure: Secondary | ICD-10-CM | POA: Diagnosis not present

## 2024-11-06 DIAGNOSIS — E1122 Type 2 diabetes mellitus with diabetic chronic kidney disease: Secondary | ICD-10-CM | POA: Diagnosis not present

## 2024-11-06 DIAGNOSIS — N186 End stage renal disease: Secondary | ICD-10-CM | POA: Diagnosis not present

## 2024-11-06 DIAGNOSIS — J188 Other pneumonia, unspecified organism: Secondary | ICD-10-CM | POA: Diagnosis not present

## 2024-11-06 DIAGNOSIS — R6521 Severe sepsis with septic shock: Secondary | ICD-10-CM | POA: Diagnosis not present

## 2024-11-06 LAB — RENAL FUNCTION PANEL
Albumin: 2.9 g/dL — ABNORMAL LOW (ref 3.5–5.0)
Albumin: 3 g/dL — ABNORMAL LOW (ref 3.5–5.0)
Anion gap: 15 (ref 5–15)
Anion gap: 15 (ref 5–15)
BUN: 30 mg/dL — ABNORMAL HIGH (ref 8–23)
BUN: 23 mg/dL (ref 8–23)
CO2: 20 mmol/L — ABNORMAL LOW (ref 22–32)
CO2: 18 mmol/L — ABNORMAL LOW (ref 22–32)
Calcium: 7.8 mg/dL — ABNORMAL LOW (ref 8.9–10.3)
Calcium: 7.9 mg/dL — ABNORMAL LOW (ref 8.9–10.3)
Chloride: 88 mmol/L — ABNORMAL LOW (ref 98–111)
Chloride: 93 mmol/L — ABNORMAL LOW (ref 98–111)
Creatinine, Ser: 5.81 mg/dL — ABNORMAL HIGH (ref 0.61–1.24)
Creatinine, Ser: 4.44 mg/dL — ABNORMAL HIGH (ref 0.61–1.24)
GFR, Estimated: 10 mL/min — ABNORMAL LOW
GFR, Estimated: 14 mL/min — ABNORMAL LOW
Glucose, Bld: 91 mg/dL (ref 70–99)
Glucose, Bld: 118 mg/dL — ABNORMAL HIGH (ref 70–99)
Phosphorus: 4.1 mg/dL (ref 2.5–4.6)
Phosphorus: 3.4 mg/dL (ref 2.5–4.6)
Potassium: 4.4 mmol/L (ref 3.5–5.1)
Potassium: 4.6 mmol/L (ref 3.5–5.1)
Sodium: 122 mmol/L — ABNORMAL LOW (ref 135–145)
Sodium: 126 mmol/L — ABNORMAL LOW (ref 135–145)

## 2024-11-06 LAB — CBC
HCT: 34 % — ABNORMAL LOW (ref 39.0–52.0)
Hemoglobin: 11.3 g/dL — ABNORMAL LOW (ref 13.0–17.0)
MCH: 25.1 pg — ABNORMAL LOW (ref 26.0–34.0)
MCHC: 33.2 g/dL (ref 30.0–36.0)
MCV: 75.6 fL — ABNORMAL LOW (ref 80.0–100.0)
Platelets: 260 K/uL (ref 150–400)
RBC: 4.5 MIL/uL (ref 4.22–5.81)
RDW: 23.9 % — ABNORMAL HIGH (ref 11.5–15.5)
WBC: 7.1 K/uL (ref 4.0–10.5)
nRBC: 0 % (ref 0.0–0.2)

## 2024-11-06 LAB — GLUCOSE, CAPILLARY
Glucose-Capillary: 85 mg/dL (ref 70–99)
Glucose-Capillary: 80 mg/dL (ref 70–99)
Glucose-Capillary: 101 mg/dL — ABNORMAL HIGH (ref 70–99)
Glucose-Capillary: 116 mg/dL — ABNORMAL HIGH (ref 70–99)
Glucose-Capillary: 86 mg/dL (ref 70–99)

## 2024-11-06 LAB — APTT: aPTT: 50 s — ABNORMAL HIGH (ref 24–36)

## 2024-11-06 LAB — MAGNESIUM: Magnesium: 2.2 mg/dL (ref 1.7–2.4)

## 2024-11-06 MED ORDER — LIDOCAINE HCL 1 % IJ SOLN
10.0000 mL | Freq: Once | INTRAMUSCULAR | Status: DC
  Filled 2024-11-06: qty 10

## 2024-11-06 MED ORDER — MIDODRINE HCL 5 MG PO TABS
30.0000 mg | ORAL_TABLET | Freq: Three times a day (TID) | ORAL | Status: DC
  Administered 2024-11-06 – 2024-11-07 (×3): 30 mg via ORAL
  Filled 2024-11-06 (×3): qty 6

## 2024-11-06 MED ORDER — LIDOCAINE HCL (PF) 1 % IJ SOLN
INTRAMUSCULAR | Status: AC
  Filled 2024-11-06: qty 5

## 2024-11-06 MED ORDER — CHLORHEXIDINE GLUCONATE CLOTH 2 % EX PADS
6.0000 | MEDICATED_PAD | Freq: Every day | CUTANEOUS | Status: DC
  Administered 2024-11-06 – 2024-11-09 (×4): 6 via TOPICAL

## 2024-11-06 MED ORDER — ENSURE PLUS HIGH PROTEIN PO LIQD
237.0000 mL | Freq: Two times a day (BID) | ORAL | Status: DC
  Administered 2024-11-07 – 2024-11-14 (×10): 237 mL via ORAL

## 2024-11-06 MED ORDER — RENA-VITE PO TABS
1.0000 | ORAL_TABLET | Freq: Every day | ORAL | Status: DC
  Administered 2024-11-06 – 2024-11-29 (×24): 1 via ORAL
  Filled 2024-11-06 (×18): qty 1

## 2024-11-06 NOTE — Progress Notes (Signed)
 Initial Nutrition Assessment  DOCUMENTATION CODES:   Not applicable  INTERVENTION:  Add Ensure Plus High Protein po BID, each supplement provides 350 kcal and 20 grams of protein  Renal MVI with minerals daily Daily weights to assess volume overload and nutrition status Recommend d/c phosphorus binder until CRRT stopped Monitor bowels and need for scheduled bowel regimen Liberalize diet to CHO modified while on CRRT; fluid restriction to remain in place   NUTRITION DIAGNOSIS:  Increased nutrient needs related to chronic illness as evidenced by estimated needs.   GOAL:  Patient will meet greater than or equal to 90% of their needs   MONITOR:  PO intake, Supplement acceptance, Labs, Weight trends  REASON FOR ASSESSMENT:   Consult Assessment of nutrition requirement/status  ASSESSMENT:   Pt with PMH significant for: ESRD-HD, afib, HFrEF, HTN, T2DM, CAD, severe PVD s/p R BKA. Presented to [facility] ED (12/08) and found to have multifocal PNA on CT and small effusions. Bcx positive for MRSA. Transferred to [facility] thirteen days after admission for management of septic shock and IR eval for tunneled HD catheter placement.  12/08 - admitted 12/10 - L thoracentesis: yield 12/11 - echo: EF 20-25% 12/12 - line holiday 12/15 - new femoral HD cath placed 12/17 - txr to [facility] ICU 12/20 - CRRT initiated 12/21 - a-line placed   He is requiring vasopressor support currently and has been transitioned to CRRTdue to hypotension/volume overload. O2 requirements have increased. Levo dose down trending. Vaso stable. Underweight line holiday for MRSA bacteremia and had temporary femoral HD cath placed on 12/15. A-line to be re-placed today.  RD working remotely today. Unable to connect with patient by phone. Chart review completed in lieu or nutrition-focused interview. Will collect on follow up.    Meal completions: 12/18: 60-90% x3 documented offerings 12/19: 75% x1 documented  offering 12/20: 0-80% x3 documented offerings  Intake likely somewhat inhibited by volume overload. Continue to monitor trend. Will add ONS to augment intake as needs are elevated 2/2 CRRT, infection, and chronic comorbid conditions. No documented difficulties chewing or swallowing regular diet texture.    Per chart review, he has hx of nonadherence with HD txs, which has significantly impacted IDWG trends in the past. Unsure of his recent adherence level. Prescribed 4h 15 mins run time at his outpatient HD clinic.    Admit Weight: 104.3 kg Current Weight: 116 kg EDW: 102 kg  Documented with diffuse 2+ BLE and BUE edema. Remained net even on CRRT yesterday. Currently pulling 158ml/hr, per neph note. Given significant fluid overload likely skewing true body weight trend, cannot use weight trend as prognostic indicator for malnutrition. Notably BMI also inaccurate for this reason. Will assess muscle and fat depletions as well as weight hx after volume overload resolved. Unsure of recent UBW, as patient unable to report at this time. Noted EDW from OP clinic is 102kg.    No BM noted since 12/19. Notably, this can impact lab trends and appetite level. Recommend scheduled bowel regimen if no BM in 1-2 days. Noted he is with PRN order for Miralax , which has not been administered in last three days.   Dialysis Orders: MWF [facility] 4h  B400  101.9kg  2K bath  TDC  Hep 6000 - Hectoral 5mcg IV q HD - No ESA/iron   Remains on 4K, 2.5 Ca bath. Sodium remains low 2/2 volume overload. His A1c around this time last year was >15.5% and now half that. Of note, do tend to  see more pronounced fluctuations in A1c in HD patient d/t shortened half life of red blood cell. PER DIABETES COORDINATOR: Consider adding Semglee  10 units at bedtime   He is noted on calcimimetic therapy and PHOS binders for bone mineral metabolism. No PTH noted to assess recent trend, although PHOS/Ca within desirable range currently. Could  hold off until lab trends indicate need for intervention/adjustment to his regimen.  No vitamin D analog noted, but does receive Hectorol  outpatient. PHOS binders not currently indicated due continuous blood cleaning with CRRT. Recommend discontinuing until CRRT stopped and intermittent HD re-initiated. Add renal MVI as he is at elevated risk for deficiency in water soluble vitamins with ongoing fluid removal.    Medications: cinacalet, SSI 0-5 units at bedtime, SSI 0-15 units TID, Renvela  TID w/ meals Drips:  Levo @16  Vaso @ .03   Labs:  Sodium 122 Potassium 4.4 PHOS 4.1 Corr Ca 8.7  BUN 30 Cr 5.81 CBG's 91-123x24 hours HgbA1c 8.6 (10/2024)     NUTRITION - FOCUSED PHYSICAL EXAM:  Will defer to in person follow up; findings likely skewed by fluid status  Diet Order:   Diet Order             Diet Carb Modified Room service appropriate? Yes; Fluid restriction: 1200 mL Fluid  Diet effective now                   EDUCATION NEEDS:   No education needs have been identified at this time  Skin:  Skin Assessment: Reviewed RN Assessment  Last BM:  12/19  Height:  Ht Readings from Last 1 Encounters:  10/24/24 6' 1 (1.854 m)   Weight:  Wt Readings from Last 1 Encounters:  11/06/24 116 kg    Ideal Body Weight:  78.6 kg (adjusted for R BKA)   BMI:  Body mass index is 33.74 kg/m.  Estimated Nutritional Needs:   Kcal:  2100-2300 kcals  Protein:  105-120g  Fluid:  1L + UOP  Blair Deaner MS, RD, LDN Registered Dietitian Clinical Nutrition RD Inpatient Contact Info in Amion

## 2024-11-06 NOTE — Progress Notes (Signed)
 " [facility] Progress Note   Subjective:    I/O even yesterday Levo 19 > 15 last 12 hrs Vaso 0.03 stable Per RN pulling about 100 cc/hr now 4 L [place] O2 today, was on 2 L yest  Objective Vitals:   11/06/24 0831 11/06/24 0845 11/06/24 0900 11/06/24 0915  BP: (!) 100/55 102/87 (!) 52/39 (!) 83/45  Pulse: 99 100 100 100  Resp: (!) 27 (!) 0 (!) 23 (!) 24  Temp:      TempSrc:      SpO2: 100% 99% 100% 100%  Weight:      Height:       Physical Exam General: Chronically ill, up to 4L [place] today  Heart: RRR Lungs: CTA anteriorly Abdomen: soft, obese Extremities: diffuse 2+ bilat LE > UE edema  Dialysis Access: L femoral temp cath  Dialysis Orders: MWF [facility] 4h  B400  101.9kg  2K bath  TDC  Hep 6000 - Hectoral 5mcg IV q HD - No ESA/iron   Assessment/Plan: ESRD: Usual MWF. HD here 12/19. CRRT started for vol overload w/ shock on 12/20. Cont CRRT.   Shock/volume: 14kg up by wts, diffuse bilat UE/ LE edema, repeat CXR still wet today. Chronic low BP on mido 20mg  TID and 2 pressors - levo 15 micrograms/min and vaso 0.03. UF increasing w/ CRRT at 100 cc/hr per RN, ^ if will tolerate to 125-150 cc/hr.  Hyponatremia: hypervolemic due to vol overload. 122 again today.  MRSA bacteremia: Blood Cx 12/8 +, repeat 12/10, 12/12, 12/18 negative. On Vancomycin . TDC out, s/p temp line in 12/15. IR consulted for new line - plan is wait until off pressors and out of ICU. Multifocal pneumonia: Ceftriaxone  added, pleural fluid Cx 12/10 negative. Anemia of ESRD: Hgb remains > 11, no ESA needed Secondary HPTH: CorrCa/Phos ok - continue home meds. Nutrition: Alb low, add supps T2DM: Insulin  per primary. HFrEF (EF 20-25% at baseline)   [name]  MD  [facility] 11/06/2024, 9:36 AM  Recent Labs  Lab 11/05/24 0316 11/05/24 1551 11/06/24 0248  HGB 11.6*  --  11.3*  ALBUMIN   --  3.0* 2.9*  CALCIUM  7.7* 7.9* 7.8*  PHOS 4.2 4.6 4.1  CREATININE 6.63* 6.77* 5.81*  K 4.5 4.4 4.4     Inpatient medications:  aspirin  EC  81 mg Oral Q breakfast   Chlorhexidine  Gluconate Cloth  6 each Topical Q0600   Chlorhexidine  Gluconate Cloth  6 each Topical Q0600   cinacalcet   60 mg Oral Q breakfast   guaiFENesin   600 mg Oral BID   heparin   5,000 Units Subcutaneous Q8H   insulin  aspart  0-15 Units Subcutaneous TID WC   insulin  aspart  0-5 Units Subcutaneous QHS   insulin  aspart  2 Units Subcutaneous TID WC   insulin  glargine  15 Units Subcutaneous Daily   midodrine   20 mg Oral TID WC   sevelamer  carbonate  4,000 mg Oral TID WC    anticoagulant sodium citrate      anticoagulant sodium citrate      cefTRIAXone  (ROCEPHIN )  IV Stopped (11/05/24 1039)   heparin  10,000 units/ 20 mL infusion syringe 1,000 Units/hr (11/06/24 0054)   norepinephrine  (LEVOPHED ) Adult infusion 15 mcg/min (11/06/24 0900)   prismasol  BGK 4/2.5 1,500 mL/hr at 11/06/24 0720   prismasol  BGK 4/2.5 400 mL/hr at 11/06/24 0915   prismasol  BGK 4/2.5 400 mL/hr at 11/05/24 2011   vancomycin  Stopped (11/05/24 2059)   vasopressin  0.03 Units/min (11/06/24 0900)   acetaminophen  **OR** acetaminophen , alteplase ,  anticoagulant sodium citrate , anticoagulant sodium citrate , feeding supplement (NEPRO CARB STEADY), guaiFENesin -dextromethorphan , heparin , heparin , heparin , heparin , heparin , heparin , ipratropium-albuterol , lidocaine  (PF), lidocaine  (PF), lidocaine -prilocaine , lidocaine -prilocaine , mouth rinse, pentafluoroprop-tetrafluoroeth, pentafluoroprop-tetrafluoroeth, phenol, polyethylene glycol, trimethobenzamide           "

## 2024-11-06 NOTE — Progress Notes (Signed)
 "  NAME:  [name] Suder., MRN:  [id number], DOB:  11-30-1960, LOS: 13 ADMISSION DATE:  10/24/2024, CONSULTATION DATE:  12/17 REFERRING MD:  Ricky - [facility], CHIEF COMPLAINT:  hypotension  History of Present Illness:  [name] BRIDGEWATER Sr. is a 63 y.o. male who has a PMH as outlined below including but not limited to ESRD on HD MWF, A-fib, systolic CHF, hypertension, LBBB, DM.  He presented to [facility] ED on 10/24/2024 with dyspnea x roughly 2-3 weeks. He had been seen in the ED a few days early on 12/2 for dysuria and was started on a course of Cipro  which she started the following day and stated that he had been taking as prescribed.     CT chest that demonstrated multifocal pneumonia and small effusions.  Blood cultures ultimately grew MRSA.  He had been on vancomycin  which was continued. He had a left thora 12/10 with 450cc fluid removed.  His HD catheter was removed on 10/28/2024 as well as an arterial line.  He did have a TTE on 12/11 that showed an EF of 20-25%, G2 DD, mild decrease in RV SF, severe AAS, no vegetations.  TEE was recommended but had been deferred due to patient requiring vasopressor support.  ID was consulted and recommended line holiday.  He had a temporary femoral HD cath placed on 12/15.  ID recommendations included 6 weeks of vancomycin  to end on 12/09/2024.    12/17, he was transferred to [facility] for ongoing management for septic shock as well as for IR evaluation for tunneled HD catheter placement  Pertinent  Medical History  ESRD on HD MWF, Afib, HFrEF, T2DM, LBBB, severe PVD s/p R BKA Past Medical History:  Diagnosis Date   ESRD on hemodialysis (HCC)    a. HD MWF > 10 years.   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 01/2022 Echo: EF 45-50%, glob HK, mod LVH, mildly reduced RV fxn, mild MR, mod-sev MV Ca2+. Mild AS; b. 10/2023 Echo: EF 45-50%, glob HK, GrII DD, mildly reduced RV fxn, RVSP 52.29mmHg. Mild MR/MS, mod TR.   Hypertension    Nonischemic  cardiomyopathy (HCC)    a. 2019 Cath [facility]): Nonobstructive disease; b. 12/2019 Echo: EF 50%; c. 12/2019 MV (Tx w/u [facility]): Ant infarct w/ peri-infarct ischemia. EF 35%; c. 05/2021 MV (Tx w/u [facility]): Cor Ca2+, EF 53%, motion artifact, equiv study, likely low risk.   Nonobstructive coronary artery disease    a.  2019 Cath ([place] [facility]): Left main normal, LAD 30 proximal, RCA 30 distal, EF 25%.   Obesity    Peripheral arterial disease    Peripheral vascular disease    Type 2 diabetes mellitus (HCC)    a. 10/2023 - admission w/ HHS.   Significant Hospital Events: Including procedures, antibiotic start and stop dates in addition to other pertinent events   12/8 admit 12/10 L thora with 450cc removed, transudate, neg cultures 12/11 echo 12/12 HD cath and art line removed 12/15 new femoral HD cath placed 12/17 transfer to [facility] [name] [name] 12/19 HD   Interim History / Subjective:  Transitioned to CRRT due to hypotension On levophed  15  Objective    Blood pressure (!) 83/45, pulse 100, temperature 98.8 F (37.1 C), temperature source Oral, resp. rate (!) 24, height 6' 1 (1.854 m), weight 116 kg, SpO2 100%.        Intake/Output Summary (Last 24 hours) at 11/06/2024 1003 Last data filed at 11/06/2024  0900 Gross per 24 hour  Intake 1395.44 ml  Output 1817.1 ml  Net -421.66 ml   Filed Weights   11/05/24 0412 11/05/24 1230 11/06/24 0234  Weight: 113.1 kg 114.8 kg 116 kg   Physical Exam: General: Well-appearing, no acute distress HENT: [place], AT, OP clear, MMM Eyes: EOMI, no scleral icterus Respiratory: Diminished air entry to auscultation bilaterally.  No crackles, wheezing or rales Cardiovascular: RRR, -M/R/G, no JVD GI: BS+, soft, nontender Extremities: L AVF, Right BKA, improving 1+ pitting edema,-tenderness Neuro: AAO x4, CNII-XII grossly intact Psych: Normal mood, normal affect  Imaging, labs and test in EMR in the last 24 hours reviewed independently by me. Pertinent  findings below: CXR 12/18 retrocardiac infiltrate, small left pleural effusion CXR 11/06/24 bilateral infiltrates Na 122 CO2 20 Hg 11.3 stable   Resolved problem list   Assessment and Plan   Respiratory Acute hypoxic respiratory failure 2/2 multifocal pneumonia, resolved Left pleural transudative effusion s/p thoracentesis 12/10 -Wean supplemental O2 for goal >88% -PRN Duonebs  Cardiac HFrEF 20-25% Severe Aortic stenosis 12/11 TTE EF 20-25%, G2DD, mild decrease RVF; severe AS, no vegetation. Seen by outpatient cardiology 12/2023 and inpatient in 11/2023, noted severe AS, plan is to be managed medically given multiple comorbidities. Did not tolerate DAPT in past so due to bleeding, remains just on ASA.  -GDMT limited in setting of ESRD and pressor need -Volume removal via HD  -Continue outpatient cardiology f/u  Septic shock 2/2 MRSA bacteremia -Wean levophed  and vasopressin  maintain MAP >60 -Trend LA -Midodrine  20 mg TID  -Will evaluate for A-line to facilitate pressor weaning -see ID section below  -Normal TSH, unremarkable cortisol -12/18 CXR w/ retrocardiac atelectasis vs pneumonia, on vanc -12/18 Bcx NGTD  GI Bowel: Miralax  PRN, LBM 12/17  ID Septic shock 2/2 MRSA bacteremia, pneumonia: worsening pressor requirement 12/8 Bcx grew MRSA, repeat Bcx 12/10 negative and Bcx 12/12 negative. Had line holiday w/ new L femoral HD cath placed 12/15. ID had followed while at [facility] and provided final recs.  -Vanc x 6 weeks (End date: 12/09/24) -Ceftriaxone  12/19-12/24 -ID f/u at [facility] on 12/26/24 -Will need TEE when stable   Lines: PIV, L femoral HD  Endo  T2DM A1c 8.6 this month. CBGs improved to be at goal.  -Lantus  15 units  -Novolog  2 units w/ meals -SSI-Moderate w/ meals and at bedtime   Renal ESRD on HD MWF Secondary hyperparathyroidism Per most recent fistulogram, he has an occluded subclavian vein on the right but patent RIJ vein from May, 2025.  -Appreciate  Nephrology assistance, HD 12/17 w/ 1.5 L removed via temp femoral HD cath (placed 12/15) -HD per nephrology, anticipate today  -Spoke w/ IR for [facility], will off of until out of ICU and/or off pressors, will need to reconsult them  -Continue cinacalcet  and sevelamer    Chronic Hyponatremia  Na 122 (125). Mentation at baseline. Noted weight gain since admission. Has gotten HD w/ last HD 12/17 w/ 1.5 L removed. Volume overload likely contributing -Trend sodium -HD per schedule  Heme Microcytic anemia Anemia of chronic kidney disease Hgb steady 11-12. No signs of bleeding. Treating MRSA bacteremia currently.   Anticoagulation: heparin  SQ  MSK/Other Hx of PVD: No signs of infection at R BKA. On home ASA.   Critical care time:      The patient is critically ill with multiple organ systems failure and requires high complexity decision making for assessment and support, frequent evaluation and titration of therapies, application of advanced monitoring technologies  and extensive interpretation of multiple databases.  Independent Critical Care Time: 36 Minutes.   Slater Staff, M.D. [facility] Pulmonary/Critical Care Medicine 11/06/2024 10:14 AM   Please see Amion for pager number to reach on-call Pulmonary and Critical Care Team.     "

## 2024-11-06 NOTE — Plan of Care (Signed)

## 2024-11-06 NOTE — Procedures (Signed)
 Arterial Catheter Insertion Procedure Note  [name] [name]  [id number]  1961/05/16  Date:11/06/2024  Time:4:07 PM    Provider Performing: Benton D. Harris    Procedure: Insertion of Arterial Line (63379) with US  guidance (23062)   Indication(s) Blood pressure monitoring and/or need for frequent ABGs  Consent Risks of the procedure as well as the alternatives and risks of each were explained to the patient and/or caregiver.  Consent for the procedure was obtained and is signed in the bedside chart  Anesthesia None   Time Out Verified patient identification, verified procedure, site/side was marked, verified correct patient position, special equipment/implants available, medications/allergies/relevant history reviewed, required imaging and test results available.   Sterile Technique Maximal sterile technique including full sterile barrier drape, hand hygiene, sterile gown, sterile gloves, mask, hair covering, sterile ultrasound probe cover (if used).   Procedure Description Area of catheter insertion was cleaned with chlorhexidine  and draped in sterile fashion. With real-time ultrasound guidance an arterial catheter was placed into the right brachial artery.  Appropriate arterial tracings confirmed on monitor.     Complications/Tolerance None; patient tolerated the procedure well.   EBL Minimal   Specimen(s) None  [name] [name] D. Harris, NP-C [place] [facility] Personal contact information can be found on Amion  If no contact or response made please call [phone] 11/06/2024, 4:07 PM

## 2024-11-07 DIAGNOSIS — I350 Nonrheumatic aortic (valve) stenosis: Secondary | ICD-10-CM | POA: Diagnosis not present

## 2024-11-07 DIAGNOSIS — E1122 Type 2 diabetes mellitus with diabetic chronic kidney disease: Secondary | ICD-10-CM | POA: Diagnosis not present

## 2024-11-07 DIAGNOSIS — I5022 Chronic systolic (congestive) heart failure: Secondary | ICD-10-CM | POA: Diagnosis not present

## 2024-11-07 DIAGNOSIS — R6521 Severe sepsis with septic shock: Secondary | ICD-10-CM | POA: Diagnosis not present

## 2024-11-07 DIAGNOSIS — N186 End stage renal disease: Secondary | ICD-10-CM | POA: Diagnosis not present

## 2024-11-07 DIAGNOSIS — D631 Anemia in chronic kidney disease: Secondary | ICD-10-CM | POA: Diagnosis not present

## 2024-11-07 DIAGNOSIS — A4102 Sepsis due to Methicillin resistant Staphylococcus aureus: Secondary | ICD-10-CM | POA: Diagnosis not present

## 2024-11-07 DIAGNOSIS — I132 Hypertensive heart and chronic kidney disease with heart failure and with stage 5 chronic kidney disease, or end stage renal disease: Secondary | ICD-10-CM | POA: Diagnosis not present

## 2024-11-07 LAB — RENAL FUNCTION PANEL
Albumin: 3 g/dL — ABNORMAL LOW (ref 3.5–5.0)
Anion gap: 12 (ref 5–15)
BUN: 20 mg/dL (ref 8–23)
CO2: 22 mmol/L (ref 22–32)
Calcium: 8.1 mg/dL — ABNORMAL LOW (ref 8.9–10.3)
Chloride: 96 mmol/L — ABNORMAL LOW (ref 98–111)
Creatinine, Ser: 4 mg/dL — ABNORMAL HIGH (ref 0.61–1.24)
GFR, Estimated: 16 mL/min — ABNORMAL LOW
Glucose, Bld: 62 mg/dL — ABNORMAL LOW (ref 70–99)
Phosphorus: 3.2 mg/dL (ref 2.5–4.6)
Potassium: 4.2 mmol/L (ref 3.5–5.1)
Sodium: 130 mmol/L — ABNORMAL LOW (ref 135–145)

## 2024-11-07 LAB — LACTIC ACID, PLASMA: Lactic Acid, Venous: 1.6 mmol/L (ref 0.5–1.9)

## 2024-11-07 LAB — CBC
HCT: 32.2 % — ABNORMAL LOW (ref 39.0–52.0)
Hemoglobin: 10.5 g/dL — ABNORMAL LOW (ref 13.0–17.0)
MCH: 25.1 pg — ABNORMAL LOW (ref 26.0–34.0)
MCHC: 32.6 g/dL (ref 30.0–36.0)
MCV: 77 fL — ABNORMAL LOW (ref 80.0–100.0)
Platelets: 211 K/uL (ref 150–400)
RBC: 4.18 MIL/uL — ABNORMAL LOW (ref 4.22–5.81)
RDW: 24.3 % — ABNORMAL HIGH (ref 11.5–15.5)
WBC: 7.9 K/uL (ref 4.0–10.5)
nRBC: 0 % (ref 0.0–0.2)

## 2024-11-07 LAB — APTT: aPTT: 51 s — ABNORMAL HIGH (ref 24–36)

## 2024-11-07 LAB — GLUCOSE, CAPILLARY
Glucose-Capillary: 127 mg/dL — ABNORMAL HIGH (ref 70–99)
Glucose-Capillary: 185 mg/dL — ABNORMAL HIGH (ref 70–99)
Glucose-Capillary: 194 mg/dL — ABNORMAL HIGH (ref 70–99)
Glucose-Capillary: 201 mg/dL — ABNORMAL HIGH (ref 70–99)
Glucose-Capillary: 207 mg/dL — ABNORMAL HIGH (ref 70–99)
Glucose-Capillary: 48 mg/dL — ABNORMAL LOW (ref 70–99)
Glucose-Capillary: 56 mg/dL — ABNORMAL LOW (ref 70–99)
Glucose-Capillary: 60 mg/dL — ABNORMAL LOW (ref 70–99)
Glucose-Capillary: 79 mg/dL (ref 70–99)

## 2024-11-07 LAB — MAGNESIUM: Magnesium: 2.2 mg/dL (ref 1.7–2.4)

## 2024-11-07 MED ORDER — INSULIN ASPART 100 UNIT/ML IJ SOLN
0.0000 [IU] | Freq: Three times a day (TID) | INTRAMUSCULAR | Status: DC
  Administered 2024-11-07: 1 [IU] via SUBCUTANEOUS
  Administered 2024-11-08: 2 [IU] via SUBCUTANEOUS
  Filled 2024-11-07: qty 1
  Filled 2024-11-07: qty 2

## 2024-11-07 MED ORDER — CHLORHEXIDINE GLUCONATE CLOTH 2 % EX PADS
6.0000 | MEDICATED_PAD | Freq: Every day | CUTANEOUS | Status: DC
  Administered 2024-11-08 – 2024-11-09 (×2): 6 via TOPICAL

## 2024-11-07 MED ORDER — CINACALCET HCL 30 MG PO TABS
60.0000 mg | ORAL_TABLET | Freq: Every day | ORAL | Status: DC
  Administered 2024-11-09 – 2024-11-13 (×5): 60 mg via ORAL
  Filled 2024-11-07 (×6): qty 2

## 2024-11-07 MED ORDER — SEVELAMER CARBONATE 800 MG PO TABS
4000.0000 mg | ORAL_TABLET | Freq: Three times a day (TID) | ORAL | Status: DC
  Administered 2024-11-09 (×3): 4000 mg via ORAL
  Filled 2024-11-07 (×3): qty 5

## 2024-11-07 MED ORDER — MIDODRINE HCL 5 MG PO TABS
20.0000 mg | ORAL_TABLET | Freq: Three times a day (TID) | ORAL | Status: DC
  Administered 2024-11-07 – 2024-11-09 (×8): 20 mg via ORAL
  Filled 2024-11-07 (×9): qty 4

## 2024-11-07 NOTE — Progress Notes (Addendum)
 [facility] NEPHROLOGY PROGRESS NOTE  Assessment/ Plan: Pt is a 63 y.o. yo male   Dialysis Orders: MWF [facility] 4h  B400  101.9kg  2K bath  TDC  Hep 6000 - Hectoral 5mcg IV q HD - No ESA/iron   # ESRD: Usual MWF. HD here 12/19. CRRT started for vol overload w/ shock on 12/20.  Still requiring Levophed  at 6 mics and evidence of fluid overload therefore continue CRRT.  May consider holding CRRT tomorrow and attempt IHD. Monitor lab and adjust CRRT prescription accordingly.  # Septic/cardiogenic shock due to MRSA bacteremia: Requirement of Levophed  is going down to 6 mics, on midodrine , UF with CRRT.  TDC was removed, status post temporary HD line.IR consulted for new line - plan is wait until off pressors and out of ICU.  # Acute hypoxic respiratory failure due to fluid overload, acute systolic CHF and pneumonia: UF with HD, on antibiotics per PCCM.  #Hyponatremia: hypervolemic due to vol overload.  Continue UF and fluid restriction.  # Anemia of ESRD: Hgb acceptable, no ESA needed.  #Secondary HPTH: CorrCa/Phos ok - continue home meds.  Addendum: 3:13 PM Filter clotted.  Patient is clinically improving and now requirement of Levophed  only 3 mics.  We will discontinue CRRT now and attempt intermittent dialysis tomorrow morning.  We will do temperature 36, midodrine  and albumin  as needed for hypotension and UF as tolerated.  Discussed with RN.  Subjective: Seen and examined in ICU.  Tolerating CRRT well with 6 mics of Levophed .  The pressor requirement is going down gradually.  Still evidence of fluid overload.  Discussed with ICU team. Objective Vital signs in last 24 hours: Vitals:   11/07/24 0815 11/07/24 0830 11/07/24 0845 11/07/24 0900  BP:      Pulse: 80 77 77 80  Resp: 16 18 12  (!) 25  Temp:      TempSrc:      SpO2: 100% 100% 100% 99%  Weight:      Height:       Weight change: -3.3 kg  Intake/Output Summary (Last 24 hours) at 11/07/2024 1019 Last data  filed at 11/07/2024 1000 Gross per 24 hour  Intake 1127.33 ml  Output 4177.2 ml  Net -3049.87 ml       Labs: RENAL PANEL Recent Labs  Lab 11/03/24 0501 11/04/24 0346 11/04/24 2238 11/05/24 0316 11/05/24 1551 11/06/24 0248 11/06/24 1608 11/07/24 0532  NA 125* 122* 122* 122* 122* 122* 126* 130*  K 4.4 4.6 4.2 4.5 4.4 4.4 4.6 4.2  CL 88* 87* 87* 87* 86* 88* 93* 96*  CO2 24 20* 19* 19* 21* 20* 18* 22  GLUCOSE 272* 114* 164* 189* 123* 91 118* 62*  BUN 40* 46* 34* 36* 36* 30* 23 20  CREATININE 7.02* 7.86* 6.37* 6.63* 6.77* 5.81* 4.44* 4.00*  CALCIUM  7.9* 7.8* 7.9* 7.7* 7.9* 7.8* 7.9* 8.1*  MG 1.9 1.9  --  1.8  --  2.2  --  2.2  PHOS 4.7* 4.7* 3.9 4.2 4.6 4.1 3.4 3.2  ALBUMIN   --   --  2.8*  --  3.0* 2.9* 3.0* 3.0*    Liver Function Tests: Recent Labs  Lab 11/02/24 0439 11/04/24 2238 11/06/24 0248 11/06/24 1608 11/07/24 0532  AST 66*  --   --   --   --   ALT 36  --   --   --   --   ALKPHOS 353*  --   --   --   --  BILITOT 1.3*  --   --   --   --   PROT 8.0  --   --   --   --   ALBUMIN  3.1*   < > 2.9* 3.0* 3.0*   < > = values in this interval not displayed.   No results for input(s): LIPASE, AMYLASE in the last 168 hours. No results for input(s): AMMONIA in the last 168 hours. CBC: Recent Labs    11/16/23 1518 11/17/23 0245 11/24/23 1306 11/25/23 1108 11/30/23 1351 12/01/23 0246 11/03/24 0501 11/04/24 0346 11/05/24 0316 11/06/24 0248 11/07/24 0532  HGB  --    < >  --    < >  --    < > 11.8* 11.5* 11.6* 11.3* 10.5*  MCV  --    < >  --    < >  --    < > 75.4* 75.1* 75.3* 75.6* 77.0*  VITAMINB12 2,058*  --   --   --   --   --   --   --   --   --   --   FOLATE 16.6  --   --   --   --   --   --   --   --   --   --   FERRITIN  --   --  421*  --  342*  --   --   --   --   --   --   TIBC  --   --  294  --  322  --   --   --   --   --   --   IRON   --   --  46  --  30*  --   --   --   --   --   --    < > = values in this interval not displayed.     Cardiac Enzymes: No results for input(s): CKTOTAL, CKMB, CKMBINDEX, TROPONINI in the last 168 hours. CBG: Recent Labs  Lab 11/06/24 2155 11/07/24 0726 11/07/24 0759 11/07/24 0801 11/07/24 0827  GLUCAP 86 56* 48* 60* 79    Iron  Studies: No results for input(s): IRON , TIBC, TRANSFERRIN, FERRITIN in the last 72 hours. Studies/Results: DG CHEST PORT 1 VIEW Result Date: 11/06/2024 EXAM: 1 VIEW(S) XRAY OF THE CHEST 11/06/2024 08:16:00 AM COMPARISON: 11/03/2024 CLINICAL HISTORY: Pleural effusion on left FINDINGS: LUNGS AND PLEURA: Low lung volumes. Mild pulmonary edema. Increased bilateral interstitial markings. Dense retrocardiac opacification. No pleural effusion. No pneumothorax. HEART AND MEDIASTINUM: Stable cardiomegaly. Aortic atherosclerosis. BONES AND SOFT TISSUES: No acute osseous abnormality. IMPRESSION: 1. Low lung volumes with mild pulmonary edema, increased bilateral interstitial markings, and dense retrocardiac opacification. 2. Cardiomegaly. Electronically signed by: Lonni Necessary MD 11/06/2024 12:13 PM EST RP Workstation: [id number]    Medications: Infusions:  cefTRIAXone  (ROCEPHIN )  IV 2 g (11/07/24 1001)   heparin  10,000 units/ 20 mL infusion syringe 1,000 Units/hr (11/07/24 0530)   norepinephrine  (LEVOPHED ) Adult infusion 6 mcg/min (11/07/24 1000)   prismasol  BGK 4/2.5 1,500 mL/hr at 11/07/24 0658   prismasol  BGK 4/2.5 400 mL/hr at 11/06/24 2201   prismasol  BGK 4/2.5 400 mL/hr at 11/06/24 2232   vancomycin  Stopped (11/06/24 2111)    Scheduled Medications:  aspirin  EC  81 mg Oral Q breakfast   Chlorhexidine  Gluconate Cloth  6 each Topical Daily   cinacalcet   60 mg Oral Q breakfast   feeding supplement  237 mL Oral BID BM  guaiFENesin   600 mg Oral BID   heparin   5,000 Units Subcutaneous Q8H   insulin  aspart  0-5 Units Subcutaneous QHS   insulin  aspart  0-6 Units Subcutaneous TID WC   lidocaine   10 mL Intradermal Once   midodrine   20 mg Oral  TID WC   multivitamin  1 tablet Oral QHS   sevelamer  carbonate  4,000 mg Oral TID WC    have reviewed scheduled and prn medications.  Physical Exam: General:NAD, comfortable, 4 L of oxygen  Heart:RRR, s1s2 nl Lungs:clear b/l, no crackle Abdomen:soft, Non-tender, non-distended Extremities: Bilateral upper and lower extremities edema+ Dialysis Access: Left femoral temporary catheter.  [name] [name] 11/07/2024,10:19 AM  LOS: 14 days

## 2024-11-07 NOTE — Inpatient Diabetes Management (Signed)
 Inpatient Diabetes Program Recommendations  AACE/ADA: New Consensus Statement on Inpatient Glycemic Control (2015)  Target Ranges:  Prepandial:   less than 140 mg/dL      Peak postprandial:   less than 180 mg/dL (1-2 hours)      Critically ill patients:  140 - 180 mg/dL   Lab Results  Component Value Date   GLUCAP 79 11/07/2024   HGBA1C 8.6 (H) 10/24/2024    Review of Glycemic Control  Latest Reference Range & Units 11/06/24 16:14 11/06/24 21:55 11/07/24 07:26 11/07/24 07:59 11/07/24 08:01 11/07/24 08:27  Glucose-Capillary 70 - 99 mg/dL 883 (H) 86 56 (L) 48 (L) 60 (L) 79   Diabetes history: DM  Outpatient Diabetes medications:  70/30 30 units q AM and 70/30 25 units q PM Current orders for Inpatient glycemic control:  Novolog  0-6 units tid with meals and HS  Inpatient Diabetes Program Recommendations:    Note hypoglycemia.  Lantus  discontinued. May need low dose Lantus  resumed as CBG's increase as patient was on insulin  prior to admit.    Thanks,  Randall Bullocks, RN, BC-ADM Inpatient Diabetes Coordinator Pager [phone]  (8a-5p)

## 2024-11-07 NOTE — Plan of Care (Signed)
 " Problem: Education: Goal: Knowledge of General Education information will improve Description: Including pain rating scale, medication(s)/side effects and non-pharmacologic comfort measures Outcome: Progressing   Problem: Health Behavior/Discharge Planning: Goal: Ability to manage health-related needs will improve Outcome: Progressing   Problem: Clinical Measurements: Goal: Ability to maintain clinical measurements within normal limits will improve Outcome: Progressing Goal: Will remain free from infection Outcome: Progressing Goal: Diagnostic test results will improve Outcome: Progressing Goal: Respiratory complications will improve Outcome: Progressing Goal: Cardiovascular complication will be avoided Outcome: Progressing   Problem: Activity: Goal: Risk for activity intolerance will decrease Outcome: Progressing   Problem: Nutrition: Goal: Adequate nutrition will be maintained Outcome: Progressing   Problem: Elimination: Goal: Will not experience complications related to bowel motility Outcome: Progressing Goal: Will not experience complications related to urinary retention Outcome: Progressing   Problem: Pain Managment: Goal: General experience of comfort will improve and/or be controlled Outcome: Progressing   Problem: Safety: Goal: Ability to remain free from injury will improve Outcome: Progressing   Problem: Skin Integrity: Goal: Risk for impaired skin integrity will decrease Outcome: Progressing   Problem: Activity: Goal: Ability to tolerate increased activity will improve Outcome: Progressing   Problem: Clinical Measurements: Goal: Ability to maintain a body temperature in the normal range will improve Outcome: Progressing   Problem: Respiratory: Goal: Ability to maintain adequate ventilation will improve Outcome: Progressing Goal: Ability to maintain a clear airway will improve Outcome: Progressing   Problem: Education: Goal: Ability to  describe self-care measures that may prevent or decrease complications (Diabetes Survival Skills Education) will improve Outcome: Progressing Goal: Individualized Educational Video(s) Outcome: Progressing   Problem: Coping: Goal: Ability to adjust to condition or change in health will improve Outcome: Progressing   Problem: Fluid Volume: Goal: Ability to maintain a balanced intake and output will improve Outcome: Progressing   Problem: Health Behavior/Discharge Planning: Goal: Ability to identify and utilize available resources and services will improve Outcome: Progressing Goal: Ability to manage health-related needs will improve Outcome: Progressing   Problem: Metabolic: Goal: Ability to maintain appropriate glucose levels will improve Outcome: Progressing   Problem: Nutritional: Goal: Maintenance of adequate nutrition will improve Outcome: Progressing Goal: Progress toward achieving an optimal weight will improve Outcome: Progressing   Problem: Skin Integrity: Goal: Risk for impaired skin integrity will decrease Outcome: Progressing   Problem: Tissue Perfusion: Goal: Adequacy of tissue perfusion will improve Outcome: Progressing   Problem: Education: Goal: Knowledge of disease and its progression will improve Outcome: Progressing Goal: Individualized Educational Video(s) Outcome: Progressing   Problem: Fluid Volume: Goal: Compliance with measures to maintain balanced fluid volume will improve Outcome: Progressing   Problem: Health Behavior/Discharge Planning: Goal: Ability to manage health-related needs will improve Outcome: Progressing   Problem: Nutritional: Goal: Ability to make healthy dietary choices will improve Outcome: Progressing   Problem: Clinical Measurements: Goal: Complications related to the disease process, condition or treatment will be avoided or minimized Outcome: Progressing   Problem: Education: Goal: Knowledge of disease or  condition will improve Outcome: Progressing Goal: Understanding of medication regimen will improve Outcome: Progressing Goal: Individualized Educational Video(s) Outcome: Progressing   Problem: Activity: Goal: Ability to tolerate increased activity will improve Outcome: Progressing   Problem: Cardiac: Goal: Ability to achieve and maintain adequate cardiopulmonary perfusion will improve Outcome: Progressing   Problem: Health Behavior/Discharge Planning: Goal: Ability to safely manage health-related needs after discharge will improve Outcome: Progressing   Problem: Education: Goal: Ability to demonstrate management of disease process will improve Outcome: Progressing Goal:  Ability to verbalize understanding of medication therapies will improve Outcome: Progressing Goal: Individualized Educational Video(s) Outcome: Progressing   Problem: Activity: Goal: Capacity to carry out activities will improve Outcome: Progressing   "

## 2024-11-07 NOTE — Progress Notes (Addendum)
 "  NAME:  [name] Skeels., MRN:  [id number], DOB:  Jun 10, 1961, LOS: 14 ADMISSION DATE:  10/24/2024, CONSULTATION DATE:  12/17 REFERRING MD:  Ricky - [facility], CHIEF COMPLAINT:  hypotension  History of Present Illness:  [name] MIDGLEY Sr. is a 63 y.o. male who has a PMH as outlined below including but not limited to ESRD on HD MWF, A-fib, systolic CHF, hypertension, LBBB, DM.  He presented to [facility] ED on 10/24/2024 with dyspnea x roughly 2-3 weeks. He had been seen in the ED a few days early on 12/2 for dysuria and was started on a course of Cipro  which she started the following day and stated that he had been taking as prescribed.     CT chest that demonstrated multifocal pneumonia and small effusions.  Blood cultures ultimately grew MRSA.  He had been on vancomycin  which was continued. He had a left thora 12/10 with 450cc fluid removed.  His HD catheter was removed on 10/28/2024 as well as an arterial line.  He did have a TTE on 12/11 that showed an EF of 20-25%, G2 DD, mild decrease in RV SF, severe AAS, no vegetations.  TEE was recommended but had been deferred due to patient requiring vasopressor support.  ID was consulted and recommended line holiday.  He had a temporary femoral HD cath placed on 12/15.  ID recommendations included 6 weeks of vancomycin  to end on 12/09/2024.    12/17, he was transferred to [facility] for ongoing management for septic shock as well as for IR evaluation for tunneled HD catheter placement  Pertinent  Medical History  ESRD on HD MWF, Afib, HFrEF, T2DM, LBBB, severe PVD s/p R BKA Past Medical History:  Diagnosis Date   ESRD on hemodialysis (HCC)    a. HD MWF > 10 years.   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 01/2022 Echo: EF 45-50%, glob HK, mod LVH, mildly reduced RV fxn, mild MR, mod-sev MV Ca2+. Mild AS; b. 10/2023 Echo: EF 45-50%, glob HK, GrII DD, mildly reduced RV fxn, RVSP 52.1mmHg. Mild MR/MS, mod TR.   Hypertension    Nonischemic  cardiomyopathy (HCC)    a. 2019 Cath [facility]): Nonobstructive disease; b. 12/2019 Echo: EF 50%; c. 12/2019 MV (Tx w/u [facility]): Ant infarct w/ peri-infarct ischemia. EF 35%; c. 05/2021 MV (Tx w/u [facility]): Cor Ca2+, EF 53%, motion artifact, equiv study, likely low risk.   Nonobstructive coronary artery disease    a.  2019 Cath ([place] [facility]): Left main normal, LAD 30 proximal, RCA 30 distal, EF 25%.   Obesity    Peripheral arterial disease    Peripheral vascular disease    Type 2 diabetes mellitus (HCC)    a. 10/2023 - admission w/ HHS.   Significant Hospital Events: Including procedures, antibiotic start and stop dates in addition to other pertinent events   12/8 admit 12/10 L thora with 450cc removed, transudate, neg cultures 12/11 echo 12/12 HD cath and art line removed 12/15 new femoral HD cath placed 12/17 transfer to [facility] 55M 12/19 HD  12/20 CRRT  Interim History / Subjective:  Awake and alert. Denies pain, n/v. Still cough.  On 4L [place].  On Levophed  and vasopressin .  Objective    Blood pressure (!) 87/47, pulse 91, temperature 97.6 F (36.4 C), temperature source Oral, resp. rate 18, height 6' 1 (1.854 m), weight 111.5 kg, SpO2 100%.        Intake/Output Summary (Last 24  hours) at 11/07/2024 0611 Last data filed at 11/07/2024 0600 Gross per 24 hour  Intake 945.91 ml  Output 4138 ml  Net -3192.09 ml   Filed Weights   11/05/24 1230 11/06/24 0234 11/07/24 0500  Weight: 114.8 kg 116 kg 111.5 kg   Physical Exam:  General: alert, well-appearing, no acute distress Respiratory: normal effort on 4L, diminished breath sounds bilaterally. No wheezing or crackles. Cardiovascular: RRR GI: bowel sounds present, soft, NT Extremities: L AVF, Right BKA, 1+ pitting LLE edema, cool to touch Neuro: AAO x4, no focal deficits  Psych: Normal mood, normal affect  Imaging, labs and test in EMR in the last 24 hours reviewed independently by me. Pertinent findings below:  CXR  12/18 retrocardiac infiltrate, small left pleural effusion CXR 11/06/24 bilateral infiltrates Na 130 CO2 22 Hg 10.5 stable Lactic acid 1.6   Resolved problem list   Assessment and Plan   Respiratory Acute hypoxic respiratory failure 2/2 multifocal pneumonia, improving Left pleural transudative effusion s/p thoracentesis 12/10 -Wean supplemental O2 for goal >88% -PRN Duonebs  Cardiac HFrEF 20-25% Severe Aortic stenosis 12/11 TTE EF 20-25%, G2DD, mild decrease RVF; severe AS, no vegetation. Seen by outpatient cardiology 12/2023 and inpatient in 11/2023, noted severe AS, plan is to be managed medically given multiple comorbidities. Did not tolerate DAPT in past so due to bleeding, remains just on ASA.  -GDMT limited in setting of ESRD and pressor need -Volume removal via HD  -Continue outpatient cardiology f/u  Septic shock 2/2 MRSA bacteremia -Wean levophed  and vasopressin  maintain MAP >60, accurate BP on A-line -LA normal -Midodrine  20 mg TID  -see ID section below  -Normal TSH, unremarkable cortisol -12/18 CXR w/ retrocardiac atelectasis vs pneumonia, on vanc -12/18 Bcx NGTD  GI Bowel: Miralax  PRN  ID Septic shock 2/2 MRSA bacteremia, pneumonia: worsening pressor requirement 12/8 Bcx grew MRSA, repeat Bcx 12/10 negative and Bcx 12/12 negative. Had line holiday w/ new L femoral HD cath placed 12/15. ID had followed while at [facility] and provided final recs.  -Vanc x 6 weeks (End date: 12/09/24) -Ceftriaxone  12/19-12/24  -ID f/u at [facility] on 12/26/24  -Will need TEE when stable    Lines: PIV, A line,  L femoral HD   Endo  T2DM A1c 8.6 this month. Hypoglycemia this AM.  -Stopped Lantus   -Change SSI to VS given hypoglycemia, otherwise can d/c SSI  Renal ESRD on HD MWF Secondary hyperparathyroidism Per most recent fistulogram, he has an occluded subclavian vein on the right but patent RIJ vein from May, 2025.  -Has temp L femoral HD catheter.  -Appreciate Nephrology  assistance -CRRT per nephrology -Spoke w/ IR for [facility], will off of until out of ICU and/or off pressors, will need to reconsult them  -Continue cinacalcet  and sevelamer    Chronic Hyponatremia  Improved. Mentation at baseline. Noted weight gain since admission. Volume overload likely contributing.  -Trend sodium -HD per schedule  Heme Microcytic anemia Anemia of chronic kidney disease Hgb steady. No signs of bleeding. Trend CBC.   Anticoagulation: heparin  SQ  MSK/Other Hx of PVD: No signs of infection at R BKA. On home ASA.   Critical care time:       11/07/2024 6:11 AM   Please see Amion for pager number to reach on-call Pulmonary and Critical Care Team.     "

## 2024-11-07 NOTE — Plan of Care (Signed)
  Problem: Education: Goal: Knowledge of General Education information will improve Description: Including pain rating scale, medication(s)/side effects and non-pharmacologic comfort measures Outcome: Progressing   Problem: Clinical Measurements: Goal: Ability to maintain clinical measurements within normal limits will improve Outcome: Progressing   Problem: Nutrition: Goal: Adequate nutrition will be maintained Outcome: Progressing   Problem: Pain Managment: Goal: General experience of comfort will improve and/or be controlled Outcome: Progressing

## 2024-11-08 DIAGNOSIS — D631 Anemia in chronic kidney disease: Secondary | ICD-10-CM | POA: Diagnosis not present

## 2024-11-08 DIAGNOSIS — I350 Nonrheumatic aortic (valve) stenosis: Secondary | ICD-10-CM | POA: Diagnosis not present

## 2024-11-08 DIAGNOSIS — E1122 Type 2 diabetes mellitus with diabetic chronic kidney disease: Secondary | ICD-10-CM | POA: Diagnosis not present

## 2024-11-08 DIAGNOSIS — I132 Hypertensive heart and chronic kidney disease with heart failure and with stage 5 chronic kidney disease, or end stage renal disease: Secondary | ICD-10-CM | POA: Diagnosis not present

## 2024-11-08 DIAGNOSIS — I5022 Chronic systolic (congestive) heart failure: Secondary | ICD-10-CM | POA: Diagnosis not present

## 2024-11-08 DIAGNOSIS — J188 Other pneumonia, unspecified organism: Secondary | ICD-10-CM | POA: Diagnosis not present

## 2024-11-08 DIAGNOSIS — J90 Pleural effusion, not elsewhere classified: Secondary | ICD-10-CM | POA: Diagnosis not present

## 2024-11-08 DIAGNOSIS — N186 End stage renal disease: Secondary | ICD-10-CM | POA: Diagnosis not present

## 2024-11-08 LAB — CULTURE, BLOOD (ROUTINE X 2)
Culture: NO GROWTH
Culture: NO GROWTH
Special Requests: ADEQUATE
Special Requests: ADEQUATE

## 2024-11-08 LAB — RENAL FUNCTION PANEL
Albumin: 2.7 g/dL — ABNORMAL LOW (ref 3.5–5.0)
Anion gap: 11 (ref 5–15)
BUN: 22 mg/dL (ref 8–23)
CO2: 21 mmol/L — ABNORMAL LOW (ref 22–32)
Calcium: 7.7 mg/dL — ABNORMAL LOW (ref 8.9–10.3)
Chloride: 97 mmol/L — ABNORMAL LOW (ref 98–111)
Creatinine, Ser: 4.15 mg/dL — ABNORMAL HIGH (ref 0.61–1.24)
GFR, Estimated: 15 mL/min — ABNORMAL LOW
Glucose, Bld: 217 mg/dL — ABNORMAL HIGH (ref 70–99)
Phosphorus: 2.9 mg/dL (ref 2.5–4.6)
Potassium: 4.5 mmol/L (ref 3.5–5.1)
Sodium: 128 mmol/L — ABNORMAL LOW (ref 135–145)

## 2024-11-08 LAB — GLUCOSE, CAPILLARY
Glucose-Capillary: 205 mg/dL — ABNORMAL HIGH (ref 70–99)
Glucose-Capillary: 216 mg/dL — ABNORMAL HIGH (ref 70–99)
Glucose-Capillary: 212 mg/dL — ABNORMAL HIGH (ref 70–99)
Glucose-Capillary: 153 mg/dL — ABNORMAL HIGH (ref 70–99)
Glucose-Capillary: 142 mg/dL — ABNORMAL HIGH (ref 70–99)
Glucose-Capillary: 114 mg/dL — ABNORMAL HIGH (ref 70–99)
Glucose-Capillary: 118 mg/dL — ABNORMAL HIGH (ref 70–99)
Glucose-Capillary: 125 mg/dL — ABNORMAL HIGH (ref 70–99)

## 2024-11-08 LAB — MAGNESIUM: Magnesium: 2.1 mg/dL (ref 1.7–2.4)

## 2024-11-08 LAB — VANCOMYCIN, RANDOM: Vancomycin Rm: 34 ug/mL

## 2024-11-08 LAB — APTT: aPTT: 42 s — ABNORMAL HIGH (ref 24–36)

## 2024-11-08 MED ORDER — INSULIN ASPART 100 UNIT/ML IJ SOLN: 0.0000 [IU] | Freq: Three times a day (TID) | INTRAMUSCULAR | Status: DC

## 2024-11-08 MED ORDER — INSULIN GLARGINE 100 UNIT/ML ~~LOC~~ SOLN
8.0000 [IU] | Freq: Every day | SUBCUTANEOUS | Status: DC
  Administered 2024-11-08 – 2024-11-09 (×2): 8 [IU] via SUBCUTANEOUS
  Filled 2024-11-08 (×3): qty 0.08

## 2024-11-08 MED ORDER — PENTAFLUOROPROP-TETRAFLUOROETH EX AERO: 1.0000 | INHALATION_SPRAY | CUTANEOUS | Status: DC | PRN

## 2024-11-08 MED ORDER — LIDOCAINE-PRILOCAINE 2.5-2.5 % EX CREA: 1.0000 | TOPICAL_CREAM | CUTANEOUS | Status: DC | PRN

## 2024-11-08 MED ORDER — INSULIN ASPART 100 UNIT/ML IJ SOLN
0.0000 [IU] | Freq: Three times a day (TID) | INTRAMUSCULAR | Status: DC
  Administered 2024-11-08: 5 [IU] via SUBCUTANEOUS
  Administered 2024-11-08: 2 [IU] via SUBCUTANEOUS
  Administered 2024-11-09: 8 [IU] via SUBCUTANEOUS
  Administered 2024-11-09 – 2024-11-10 (×2): 5 [IU] via SUBCUTANEOUS
  Administered 2024-11-10 (×2): 3 [IU] via SUBCUTANEOUS
  Administered 2024-11-11 (×2): 2 [IU] via SUBCUTANEOUS
  Administered 2024-11-12: 5 [IU] via SUBCUTANEOUS
  Administered 2024-11-12: 3 [IU] via SUBCUTANEOUS
  Administered 2024-11-12: 5 [IU] via SUBCUTANEOUS
  Administered 2024-11-13: 2 [IU] via SUBCUTANEOUS
  Administered 2024-11-13 – 2024-11-14 (×2): 3 [IU] via SUBCUTANEOUS
  Administered 2024-11-14 – 2024-11-15 (×2): 2 [IU] via SUBCUTANEOUS
  Administered 2024-11-15 – 2024-11-17 (×2): 3 [IU] via SUBCUTANEOUS
  Administered 2024-11-17: 2 [IU] via SUBCUTANEOUS
  Administered 2024-11-17 – 2024-11-19 (×4): 3 [IU] via SUBCUTANEOUS
  Administered 2024-11-19: 5 [IU] via SUBCUTANEOUS
  Administered 2024-11-19 – 2024-11-20 (×3): 2 [IU] via SUBCUTANEOUS
  Administered 2024-11-20: 3 [IU] via SUBCUTANEOUS
  Administered 2024-11-22 – 2024-11-23 (×3): 2 [IU] via SUBCUTANEOUS
  Administered 2024-11-26 – 2024-11-27 (×4): 3 [IU] via SUBCUTANEOUS
  Administered 2024-11-27: 2 [IU] via SUBCUTANEOUS
  Administered 2024-11-28: 3 [IU] via SUBCUTANEOUS
  Administered 2024-11-28: 5 [IU] via SUBCUTANEOUS
  Administered 2024-11-28: 8 [IU] via SUBCUTANEOUS
  Administered 2024-11-29: 5 [IU] via SUBCUTANEOUS
  Administered 2024-11-29 (×2): 3 [IU] via SUBCUTANEOUS
  Administered 2024-11-30: 2 [IU] via SUBCUTANEOUS
  Filled 2024-11-08: qty 2
  Filled 2024-11-08 (×2): qty 5
  Filled 2024-11-08: qty 3
  Filled 2024-11-08: qty 2
  Filled 2024-11-08: qty 3
  Filled 2024-11-08: qty 2
  Filled 2024-11-08 (×2): qty 3
  Filled 2024-11-08: qty 8
  Filled 2024-11-08: qty 5
  Filled 2024-11-08 (×2): qty 2
  Filled 2024-11-08 (×3): qty 3
  Filled 2024-11-08 (×2): qty 2
  Filled 2024-11-08: qty 3
  Filled 2024-11-08: qty 5
  Filled 2024-11-08 (×5): qty 3
  Filled 2024-11-08: qty 2
  Filled 2024-11-08: qty 3
  Filled 2024-11-08: qty 2

## 2024-11-08 MED ORDER — ANTICOAGULANT SODIUM CITRATE 4% (200MG/5ML) IV SOLN: 5.0000 mL | Status: DC | PRN

## 2024-11-08 MED ORDER — HEPARIN SODIUM (PORCINE) 1000 UNIT/ML DIALYSIS
1000.0000 [IU] | INTRAMUSCULAR | Status: DC | PRN
  Administered 2024-11-08: 2800 [IU]

## 2024-11-08 MED ORDER — ALTEPLASE 2 MG IJ SOLR: 2.0000 mg | Freq: Once | INTRAMUSCULAR | Status: DC | PRN

## 2024-11-08 MED ORDER — LIDOCAINE HCL (PF) 1 % IJ SOLN: 5.0000 mL | INTRAMUSCULAR | Status: DC | PRN

## 2024-11-08 MED ORDER — ALBUMIN HUMAN 25 % IV SOLN
25.0000 g | Freq: Once | INTRAVENOUS | Status: AC
  Administered 2024-11-11: 25 g via INTRAVENOUS
  Filled 2024-11-08: qty 100

## 2024-11-08 MED ORDER — HEPARIN SODIUM (PORCINE) 1000 UNIT/ML DIALYSIS
20.0000 [IU]/kg | INTRAMUSCULAR | Status: DC | PRN
  Administered 2024-11-08: 2200 [IU] via INTRAVENOUS_CENTRAL

## 2024-11-08 MED ORDER — VANCOMYCIN VARIABLE DOSE PER UNSTABLE RENAL FUNCTION (PHARMACIST DOSING): Status: DC

## 2024-11-08 NOTE — Progress Notes (Signed)
" °   11/08/24 1245  Vitals  Temp 97.6 F (36.4 C)  Pulse Rate 74  Resp 19  BP (!) 96/54  SpO2 95 %  O2 Device Nasal Cannula  Weight 110.4 kg  Type of Weight Post-Dialysis  Oxygen Therapy  O2 Flow Rate (L/min) 2 L/min  Patient Activity (if Appropriate) In bed  Pulse Oximetry Type Continuous  Oximetry Probe Site Changed No  Post Treatment  Dialyzer Clearance Clear  Hemodialysis Intake (mL) 0 mL  Liters Processed 84  Fluid Removed (mL) 1500 mL  Tolerated HD Treatment Yes   Pt tx done at bedside---7m07 Alert and oriented.  Informed consent signed and in chart.   TX duration:3.5  Patient tolerated well.   Alert, without acute distress.  Hand-off given to patient's nurse.   Access used: L femoral DLC---trialysis Access issues: no complications  Total UF removed: 1500 Medication(s) given: none   [name] "

## 2024-11-08 NOTE — Progress Notes (Signed)
 Nutrition Follow-up  DOCUMENTATION CODES:   Not applicable  INTERVENTION:  Continue Ensure Plus High Protein po BID, each supplement provides 350 kcal and 20 grams of protein  Renal MVI with minerals daily Daily weights to assess volume overload and nutrition status Recommend re-start phosphorus binder and calcimimetic Continue liberalized diet to CHO modified while on CRRT; fluid restriction remains in place   NUTRITION DIAGNOSIS:  Increased nutrient needs related to chronic illness as evidenced by estimated needs.  GOAL:  Patient will meet greater than or equal to 90% of their needs  MONITOR:  PO intake, Supplement acceptance, Labs, Weight trends  REASON FOR ASSESSMENT:   Consult Assessment of nutrition requirement/status  ASSESSMENT:  Pt with PMH significant for: ESRD-HD, afib, HFrEF, HTN, T2DM, CAD, severe PVD s/p R BKA. Presented to [facility] ED (12/08) and found to have multifocal PNA on CT and small effusions. Bcx positive for MRSA. Transferred to [facility] thirteen days after admission for management of septic shock and IR eval for tunneled HD catheter placement.  12/08 - admitted 12/10 - L thoracentesis: yield 12/11 - echo: EF 20-25% 12/12 - line holiday 12/15 - new femoral HD cath placed 12/17 - txr to [facility] ICU 12/20 - CRRT initiated 12/21 - a-line placed  12/22 - off CRRT 12/23 - HD trial; off pressors   Patient now off CRRT and underwent HD trial today and off Levo this morning. Did require small dose during HD. Patient continues with temporary HD line and plan to wait until off pressors and out of ICU to consult IR for new line placement.    Met with patient at bedside who is sleeping while undergoing HD tx. Difficult to keep engaged and keeps eyes closed when RD addresses him. Observed breakfast tray at bedside with 100% breakfast consumed. Consisted of Frosted Flakes w/ milk x2.    Meal completions: 12/21: 10% x1 documented offering 12/22: 75-100% x2  documented offerings 12/23: 100% x2 documented meals   Not able to collect recent and detailed nutrition history at time of bedside visit. He answers with one word phrases, but indicates no significant changes in appetite PTA. No reported N/V/C/D or difficulties chewing or swallowing.    Admit Weight: 104.3 kg Current Weight: 110.4 kg EDW: 102 kg Last HD tx: 12/23 UF achieved: 1500 ml   Continues with notable edema.  Given significant fluid overload likely skewing true body weight trend, cannot use weight trend as prognostic indicator for malnutrition. Notably BMI also inaccurate for this reason. Will assess muscle and fat depletions as well as weight hx after volume overload resolved. Noted EDW from OP clinic is 102kg.     Bowels now moving. Monitor frequency and need for scheduled bowel regimen, as this can affect lab trends and reabsorption of PHOS and potassium. Has Miralax  ordered PRN. Has not been administered.   Dialysis Orders: MWF [facility] 4h  B400  101.9kg  2K bath  TDC  Hep 6000 - Hectoral 5mcg IV q HD - No ESA/iron    Sodium remains low 2/2 volume overload, but is improving. Blood sugars fluctuating significantly. Modification to insulin  regimen ongoing.    He is noted on calcimimetic therapy and PHOS binders for bone mineral metabolism. Plan to restart tomorrow now that he is off CRRT. No PTH noted to assess recent trend, although PHOS/Ca within desirable range currently.    No vitamin D analog noted, but does receive Hectorol  outpatient.     Medications: cinacalet, SSI 0-5 units at bedtime,  SSI 0-15 units TID, Lantus  8 units daily, Renvela  TID w/ meals, renal MVI    Labs:  Sodium 128 Potassium 4.5 PHOS 2.9 Corr Ca 8.7  BUN 22 Cr 4.15 CBG's 48-216 x24 hours HgbA1c 8.6 (10/2024)   NUTRITION - FOCUSED PHYSICAL EXAM: No significant muscle or fat depletions on exam. Notably, could be skewed by body habitus and excessive adipose tissue as well as continued volume  overload. Will benefit from follow up NFPE when edema improved.   Flowsheet Row Most Recent Value  Orbital Region No depletion  Upper Arm Region No depletion  Thoracic and Lumbar Region No depletion  Buccal Region No depletion  Temple Region No depletion  Clavicle Bone Region No depletion  Clavicle and Acromion Bone Region No depletion  Scapular Bone Region Unable to assess  Dorsal Hand No depletion  Patellar Region Unable to assess  [edema]  Anterior Thigh Region Unable to assess  [edema]  Posterior Calf Region Unable to assess  [edema,  R BKA]  Edema (RD Assessment) Moderate  Hair Reviewed  Eyes Reviewed  Mouth Reviewed  Skin Reviewed  Nails Reviewed     Diet Order:   Diet Order             Diet Carb Modified Room service appropriate? Yes; Fluid restriction: 1200 mL Fluid  Diet effective now            EDUCATION NEEDS:  No education needs have been identified at this time  Skin:  Skin Assessment: Skin Integrity Issues: Skin Integrity Issues:: Stage II Stage II: coccyx  Last BM:  12/22 - type 4 x1  Height:  Ht Readings from Last 1 Encounters:  10/24/24 6' 1 (1.854 m)   Weight:  Wt Readings from Last 1 Encounters:  11/08/24 110.4 kg   Ideal Body Weight:  78.6 kg (adjusted for R BKA)  BMI:  Body mass index is 32.11 kg/m.  Estimated Nutritional Needs:   Kcal:  2100-2300 kcals  Protein:  105-120g  Fluid:  1L + UOP  Blair Deaner MS, RD, LDN Registered Dietitian Clinical Nutrition RD Inpatient Contact Info in Amion

## 2024-11-08 NOTE — Progress Notes (Addendum)
  [facility]ESS NOTE  Assessment/ Plan: Pt is a 63 y.o. yo male   Dialysis Orders: MWF [facility] 4h  B400  101.9kg  2K bath  TDC  Hep 6000 - Hectoral 5mcg IV q HD - No ESA/iron   # ESRD: Usual MWF. HD here 12/19. CRRT started for vol overload w/ shock on 12/20-12/22. - Off of CRRT yesterday afternoon.  Receiving trial of IHD today.  Adjusted UF goal.  May need small dose of Levophed  to maintain blood pressure.  Discussed with dialysis nurse and ICU team.    # Septic/cardiogenic shock due to MRSA bacteremia: Off of Levophed  this morning, on midodrine , UF with IHD.  TDC was removed, status post temporary HD line.IR consulted for new line - plan is wait until off pressors and out of ICU.  # Acute hypoxic respiratory failure due to fluid overload, acute systolic CHF and pneumonia: UF with HD, on antibiotics per PCCM.  #Hyponatremia: hypervolemic due to vol overload.  Continue UF and fluid restriction.  # Anemia of ESRD: Hgb acceptable, no ESA needed.  #Secondary HPTH: CorrCa/Phos ok - continue home meds.  Cinacalcet  and sevelamer  on hold because of CRRT, resuming from tomorrow.  Subjective: Seen and examined in ICU.  Off of CRRT since yesterday afternoon.  IHD this morning.  Denies nausea, vomiting, chest pain or shortness of breath.  Requiring small dose of Levophed  during HD.  Objective Vital signs in last 24 hours: Vitals:   11/08/24 0900 11/08/24 0915 11/08/24 0930 11/08/24 1000  BP: (!) 110/54 (!) 113/53 (!) 101/52 (!) 96/47  Pulse: 75 76 76 73  Resp: (!) 27 (!) 26 18 18   Temp:      TempSrc:      SpO2: 96% 100% 99% 100%  Weight:      Height:       Weight change: 0.4 kg  Intake/Output Summary (Last 24 hours) at 11/08/2024 1014 Last data filed at 11/08/2024 0900 Gross per 24 hour  Intake 1093.81 ml  Output 1337.9 ml  Net -244.09 ml       Labs: RENAL PANEL Recent Labs  Lab 11/04/24 0346 11/04/24 2238 11/05/24 0316 11/05/24 1551  11/06/24 0248 11/06/24 1608 11/07/24 0532 11/08/24 0436  NA 122*   < > 122* 122* 122* 126* 130* 128*  K 4.6   < > 4.5 4.4 4.4 4.6 4.2 4.5  CL 87*   < > 87* 86* 88* 93* 96* 97*  CO2 20*   < > 19* 21* 20* 18* 22 21*  GLUCOSE 114*   < > 189* 123* 91 118* 62* 217*  BUN 46*   < > 36* 36* 30* 23 20 22   CREATININE 7.86*   < > 6.63* 6.77* 5.81* 4.44* 4.00* 4.15*  CALCIUM  7.8*   < > 7.7* 7.9* 7.8* 7.9* 8.1* 7.7*  MG 1.9  --  1.8  --  2.2  --  2.2 2.1  PHOS 4.7*   < > 4.2 4.6 4.1 3.4 3.2 2.9  ALBUMIN   --    < >  --  3.0* 2.9* 3.0* 3.0* 2.7*   < > = values in this interval not displayed.    Liver Function Tests: Recent Labs  Lab 11/02/24 0439 11/04/24 2238 11/06/24 1608 11/07/24 0532 11/08/24 0436  AST 66*  --   --   --   --   ALT 36  --   --   --   --   ALKPHOS 353*  --   --   --   --  BILITOT 1.3*  --   --   --   --   PROT 8.0  --   --   --   --   ALBUMIN  3.1*   < > 3.0* 3.0* 2.7*   < > = values in this interval not displayed.   No results for input(s): LIPASE, AMYLASE in the last 168 hours. No results for input(s): AMMONIA in the last 168 hours. CBC: Recent Labs    11/16/23 1518 11/17/23 0245 11/24/23 1306 11/25/23 1108 11/30/23 1351 12/01/23 0246 11/03/24 0501 11/04/24 0346 11/05/24 0316 11/06/24 0248 11/07/24 0532  HGB  --    < >  --    < >  --    < > 11.8* 11.5* 11.6* 11.3* 10.5*  MCV  --    < >  --    < >  --    < > 75.4* 75.1* 75.3* 75.6* 77.0*  VITAMINB12 2,058*  --   --   --   --   --   --   --   --   --   --   FOLATE 16.6  --   --   --   --   --   --   --   --   --   --   FERRITIN  --   --  421*  --  342*  --   --   --   --   --   --   TIBC  --   --  294  --  322  --   --   --   --   --   --   IRON   --   --  46  --  30*  --   --   --   --   --   --    < > = values in this interval not displayed.    Cardiac Enzymes: No results for input(s): CKTOTAL, CKMB, CKMBINDEX, TROPONINI in the last 168 hours. CBG: Recent Labs  Lab 11/07/24 1938  11/07/24 2121 11/07/24 2342 11/08/24 0336 11/08/24 0723  GLUCAP 201* 207* 185* 205* 216*    Iron  Studies: No results for input(s): IRON , TIBC, TRANSFERRIN, FERRITIN in the last 72 hours. Studies/Results: No results found.   Medications: Infusions:  anticoagulant sodium citrate      cefTRIAXone  (ROCEPHIN )  IV Stopped (11/07/24 1036)   norepinephrine  (LEVOPHED ) Adult infusion 2 mcg/min (11/08/24 0900)    Scheduled Medications:  aspirin  EC  81 mg Oral Q breakfast   Chlorhexidine  Gluconate Cloth  6 each Topical Daily   Chlorhexidine  Gluconate Cloth  6 each Topical Q0600   [START ON 11/09/2024] cinacalcet   60 mg Oral Q breakfast   feeding supplement  237 mL Oral BID BM   guaiFENesin   600 mg Oral BID   heparin   5,000 Units Subcutaneous Q8H   insulin  aspart  0-15 Units Subcutaneous TID WC   insulin  aspart  0-5 Units Subcutaneous QHS   lidocaine   10 mL Intradermal Once   midodrine   20 mg Oral TID WC   multivitamin  1 tablet Oral QHS   [START ON 11/09/2024] sevelamer  carbonate  4,000 mg Oral TID WC   vancomycin  variable dose per unstable renal function (pharmacist dosing)   Does not apply See admin instructions    have reviewed scheduled and prn medications.  Physical Exam: General:NAD, comfortable, 2 L of oxygen  Heart:RRR, s1s2 nl Lungs:clear b/l, no crackle Abdomen:soft, Non-tender, non-distended Extremities: Bilateral upper and  lower extremities edema+ Dialysis Access: Left femoral temporary catheter.  Juliani Laduke Prasad Khaleb Broz 11/08/2024,10:14 AM  LOS: 15 days

## 2024-11-08 NOTE — Progress Notes (Addendum)
 "  NAME:  [name] [name]., MRN:  [id number], DOB:  Jun 29, 1961, LOS: 15 ADMISSION DATE:  10/24/2024, CONSULTATION DATE:  12/17 REFERRING MD:  Ricky - [facility], CHIEF COMPLAINT:  hypotension  History of Present Illness:  [name] is a 63 y.o. male who has a PMH as outlined below including but not limited to ESRD on HD MWF, A-fib, systolic CHF, hypertension, LBBB, DM.  He presented to [facility] ED on 10/24/2024 with dyspnea x roughly 2-3 weeks. He had been seen in the ED a few days early on 12/2 for dysuria and was started on a course of Cipro  which she started the following day and stated that he had been taking as prescribed.     CT chest that demonstrated multifocal pneumonia and small effusions.  Blood cultures ultimately grew MRSA.  He had been on vancomycin  which was continued. He had a left thora 12/10 with 450cc fluid removed.  His HD catheter was removed on 10/28/2024 as well as an arterial line.  He did have a TTE on 12/11 that showed an EF of 20-25%, G2 DD, mild decrease in RV SF, severe AAS, no vegetations.  TEE was recommended but had been deferred due to patient requiring vasopressor support.  ID was consulted and recommended line holiday.  He had a temporary femoral HD cath placed on 12/15.  ID recommendations included 6 weeks of vancomycin  to end on 12/09/2024.    12/17, he was transferred to [facility] for ongoing management for septic shock as well as for IR evaluation for tunneled HD catheter placement  Pertinent  Medical History  ESRD on HD MWF, Afib, HFrEF, T2DM, LBBB, severe PVD s/p R BKA Past Medical History:  Diagnosis Date   ESRD on hemodialysis (HCC)    a. HD MWF > 10 years.   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 01/2022 Echo: EF 45-50%, glob HK, mod LVH, mildly reduced RV fxn, mild MR, mod-sev MV Ca2+. Mild AS; b. 10/2023 Echo: EF 45-50%, glob HK, GrII DD, mildly reduced RV fxn, RVSP 52.80mmHg. Mild MR/MS, mod TR.   Hypertension    Nonischemic  cardiomyopathy (HCC)    a. 2019 Cath [facility]): Nonobstructive disease; b. 12/2019 Echo: EF 50%; c. 12/2019 MV (Tx w/u [facility]): Ant infarct w/ peri-infarct ischemia. EF 35%; c. 05/2021 MV (Tx w/u [facility]): Cor Ca2+, EF 53%, motion artifact, equiv study, likely low risk.   Nonobstructive coronary artery disease    a.  2019 Cath ([facility]): Left main normal, LAD 30 proximal, RCA 30 distal, EF 25%.   Obesity    Peripheral arterial disease    Peripheral vascular disease    Type 2 diabetes mellitus (HCC)    a. 10/2023 - admission w/ HHS.   Significant Hospital Events: Including procedures, antibiotic start and stop dates in addition to other pertinent events   12/8 admit 12/10 L thora with 450cc removed, transudate, neg cultures 12/11 echo 12/12 HD cath and art line removed 12/15 new femoral HD cath placed 12/17 transfer to [facility] 60M 12/19 HD  12/20 CRRT 12/23 Weaned off levophed  this am. HD trial this am.  Interim History / Subjective:  Weaned off levophed  Trial of HD today  Objective    Blood pressure (!) 101/52, pulse 76, temperature 97.6 F (36.4 C), temperature source Axillary, resp. rate 18, height 6' 1 (1.854 m), weight 111.9 kg, SpO2 99%.        Intake/Output Summary (Last 24  hours) at 11/08/2024 0954 Last data filed at 11/08/2024 0900 Gross per 24 hour  Intake 1336.43 ml  Output 1507.9 ml  Net -171.47 ml   Filed Weights   11/07/24 0500 11/08/24 0500 11/08/24 0834  Weight: 111.5 kg 111.9 kg 111.9 kg   Physical Exam: General: Well-appearing, no acute distress HENT: [place], AT, OP clear, MMM Eyes: EOMI, no scleral icterus Respiratory: Clear to auscultation bilaterally.  No crackles, wheezing or rales Cardiovascular: RRR, -M/R/G, no JVD GI: BS+, soft, nontender Extremities: Right BKA, L AVF, 1+ pitting edema in left lower extremity,-tenderness Neuro: AAO x4, CNII-XII grossly intact Psych: Normal mood, normal affect  Imaging, labs and test in EMR in the last  24 hours reviewed independently by me. Pertinent findings below:  CXR 12/18 retrocardiac infiltrate, small left pleural effusion CXR 11/06/24 bilateral infiltrates Na 128 CO2 21 Bcx 12/18 NGTD Final  Resolved problem list   Assessment and Plan   Acute hypoxic respiratory failure 2/2 multifocal pneumonia, improving Left pleural transudative effusion s/p thoracentesis 12/10 -Wean supplmental O2 for goal >88% -PRN Duonebs  HFrEF 20-25% Severe Aortic stenosis 12/11 TTE EF 20-25%, G2DD, mild decrease RVF; severe AS, no vegetation. Seen by outpatient cardiology 12/2023 and inpatient in 11/2023, noted severe AS, plan is to be managed medically given multiple comorbidities. Did not tolerate DAPT in past so due to bleeding, remains just on ASA.  -GDMT limited in setting of ESRD and pressor need -Volume removal per Nephrology -Continue outpatient cardiology f/u  Septic shock 2/2 MRSA bacteremia, pneumonia - resolved shock. Line holiday with new L femoral HD cath 12/15 Bcx 12/18 NGTD -Off pressors 12/23 -Vanc x 6 weeks (End date: 12/09/24) -Complete ceftriaxone  course x 5 days for pneumonia -Midodrine  20 mg TID. Consider reducing in setting of heart failure however patient aware of adverse effects and wishes to continue this medication. -ID requesting to pursue TEE. Scheduled for Friday -Once off pressors and discharged from ICU, will need to consult IR for tunneled cath -ID f/u at [facility] on 12/26/24    T2DM A1c 8.6 this month -Increase SSI. May need to restart lantus    ESRD on HD MWF Secondary hyperparathyroidism Per most recent fistulogram, he has an occluded subclavian vein on the right but patent RIJ vein from May, 2025.  -Has temp L femoral HD catheter.  -Appreciate Nephrology assistance. HD trial today with levophed  for support if needed. So far off pressors -Spoke w/ IR for [facility], team has requested patient to be out of ICU and off pressors. Will need to reconsult at that time -Holding  cinacalcet  and sevelamer  for 48 hours with plan to restart 12/24 now that we are on dialysis  Chronic Hyponatremia - improving Volume overload likely contributing.  -Daily BMET -HD per schedule  Microcytic anemia Anemia of chronic kidney disease Hgb steady. No signs of bleeding. Trend CBC.   Anticoagulation: heparin  SQ  Hx of PVD: No signs of infection at R BKA. On home ASA.   Critical care time:      The patient is critically ill with multiple organ systems failure and requires high complexity decision making for assessment and support, frequent evaluation and titration of therapies, application of advanced monitoring technologies and extensive interpretation of multiple databases.  Independent Critical Care Time: 35 Minutes.   Slater Staff, M.D. [facility] Pulmonary/Critical Care Medicine 11/08/2024 9:54 AM   Please see Amion for pager number to reach on-call Pulmonary and Critical Care Team.    "

## 2024-11-08 NOTE — Inpatient Diabetes Management (Signed)
 Inpatient Diabetes Program Recommendations  AACE/ADA: New Consensus Statement on Inpatient Glycemic Control   Target Ranges:  Prepandial:   less than 140 mg/dL      Peak postprandial:   less than 180 mg/dL (1-2 hours)      Critically ill patients:  140 - 180 mg/dL    Latest Reference Range & Units 11/08/24 03:36 11/08/24 07:23 11/08/24 11:19  Glucose-Capillary 70 - 99 mg/dL 794 (H) 783 (H) 787 (H)    Latest Reference Range & Units 11/07/24 07:26 11/07/24 07:59 11/07/24 08:01 11/07/24 08:27 11/07/24 11:11 11/07/24 16:20 11/07/24 19:38 11/07/24 21:21 11/07/24 23:42  Glucose-Capillary 70 - 99 mg/dL 56 (L) 48 (L) 60 (L) 79 127 (H) 194 (H) 201 (H) 207 (H) 185 (H)   Review of Glycemic Control  Diabetes history: DM2 Outpatient Diabetes medications: 70/30 30 units QAM 70/30 25 units QPM Current orders for Inpatient glycemic control: Novolog  0-15 units TID with meals, Novolog  0-5 units QHS  Inpatient Diabetes Program Recommendations:    Insulin : Noted CBG down to 48 mg/dl on 87/77/74 (had received Lantus  15 units on 12/21) so Lantus  was discontinued. CBGs today 216 and 212 mg/dl. Please consider ordering Lantus  6 units Q24H.  Thanks Earnie Gainer, RN, MSN, CDCES Diabetes Coordinator Inpatient Diabetes Program [phone] (Team Pager from 8am to 5pm)

## 2024-11-08 NOTE — Progress Notes (Addendum)
 eLink Physician-Brief Progress Note Patient Name: [name] MCAULAY Sr. DOB: 05/19/1961 MRN: [id number]   Date of Service  11/08/2024  HPI/Events of Note   63 y.o. male admitted on 10/24/2024 with MRSA bacteremia and PNA blood pressure on the lower side, recently had HD  eICU Interventions  Albumin  bolus   0042 -blood pressure readings are inconsistent, limited as to where blood pressure cuff can be placed.  Readings within few minutes ranged from systolic 48 to systolic 137.  Alert and oriented.  Obtain arterial line.  Requesting something for itching.  0140 - RT unable to get A-line, patient still mentating appropriately, only wants a physician to try.  BP still labile ranging from 30s-130s.  Referred to ground team for potential A-line.  Intervention Category Intermediate Interventions: Hypotension - evaluation and management  Alan Riles 11/08/2024, 11:07 PM

## 2024-11-08 NOTE — Progress Notes (Signed)
 Pharmacy Antibiotic Note  [name] [name]. is a 63 y.o. male admitted on 10/24/2024 with MRSA bacteremia and PNA  Pharmacy has been consulted for vancomycin  dosing.  Patient off CRRT since 12/22, trial iHD 12/23, tolerated ~3 hrs at 365ml/min BFR. Post HD vanc level is 34, which is supratherapeutic.   Plan: Hold vancomycin  dosing F/u iHD plan and re-dose after 1-2 HD sessions  Height: 6' 1 (185.4 cm) Weight: 110.4 kg (243 lb 6.2 oz) IBW/kg (Calculated) : 79.9  Temp (24hrs), Avg:97.5 F (36.4 C), Min:96.8 F (36 C), Max:97.7 F (36.5 C)  Recent Labs  Lab 11/02/24 0439 11/03/24 0501 11/04/24 0346 11/04/24 2238 11/05/24 0316 11/05/24 1551 11/06/24 0248 11/06/24 1608 11/07/24 0532 11/08/24 0436 11/08/24 1426  WBC 9.3 9.7 8.1  --  8.0  --  7.1  --  7.9  --   --   CREATININE 8.24* 7.02* 7.86*   < > 6.63* 6.77* 5.81* 4.44* 4.00* 4.15*  --   LATICACIDVEN  --   --   --   --   --   --   --   --  1.6  --   --   VANCOTROUGH 25*  --   --   --   --   --   --   --   --   --   --   VANCORANDOM  --   --   --   --   --   --   --   --   --   --  34   < > = values in this interval not displayed.    Estimated Creatinine Clearance: 24 mL/min (A) (by C-G formula based on SCr of 4.15 mg/dL (H)).    Allergies[1]  Antimicrobials this admission: Vanc 12/8>(12/09/24) CTX/azith 12/8 x1; 12/19 > (12/23)  Cefepime  12/8  Dose adjustments this admission: 12/20 CRRT started, dose adjusted vancomycin    Microbiology results: 12/18 BCX Ngtd 12/12 BCX: NGx5 12/10 pleural fluid no orgs 12/10 Bcx: NGx5 12/8 Bcx: 4/4 MRSA MRSA PCR: +  Thank you for allowing pharmacy to be a part of this patients care.  Jinnie Door, PharmD, BCPS, BCCP Clinical Pharmacist  Please check AMION for all [facility] phone numbers After 10:00 PM, call Main Pharmacy [phone]     [1]  Allergies Allergen Reactions   Sulfa Antibiotics Diarrhea and Nausea And Vomiting

## 2024-11-09 DIAGNOSIS — D509 Iron deficiency anemia, unspecified: Secondary | ICD-10-CM

## 2024-11-09 DIAGNOSIS — A419 Sepsis, unspecified organism: Secondary | ICD-10-CM

## 2024-11-09 DIAGNOSIS — I350 Nonrheumatic aortic (valve) stenosis: Secondary | ICD-10-CM

## 2024-11-09 DIAGNOSIS — N186 End stage renal disease: Secondary | ICD-10-CM | POA: Diagnosis not present

## 2024-11-09 DIAGNOSIS — B9562 Methicillin resistant Staphylococcus aureus infection as the cause of diseases classified elsewhere: Secondary | ICD-10-CM | POA: Diagnosis not present

## 2024-11-09 DIAGNOSIS — Z992 Dependence on renal dialysis: Secondary | ICD-10-CM | POA: Diagnosis not present

## 2024-11-09 DIAGNOSIS — R7881 Bacteremia: Secondary | ICD-10-CM | POA: Diagnosis not present

## 2024-11-09 DIAGNOSIS — D631 Anemia in chronic kidney disease: Secondary | ICD-10-CM | POA: Diagnosis not present

## 2024-11-09 DIAGNOSIS — E119 Type 2 diabetes mellitus without complications: Secondary | ICD-10-CM | POA: Diagnosis not present

## 2024-11-09 DIAGNOSIS — J188 Other pneumonia, unspecified organism: Secondary | ICD-10-CM | POA: Diagnosis not present

## 2024-11-09 LAB — RENAL FUNCTION PANEL
Albumin: 3 g/dL — ABNORMAL LOW (ref 3.5–5.0)
Anion gap: 12 (ref 5–15)
BUN: 19 mg/dL (ref 8–23)
CO2: 22 mmol/L (ref 22–32)
Calcium: 7.8 mg/dL — ABNORMAL LOW (ref 8.9–10.3)
Chloride: 97 mmol/L — ABNORMAL LOW (ref 98–111)
Creatinine, Ser: 3.85 mg/dL — ABNORMAL HIGH (ref 0.61–1.24)
GFR, Estimated: 17 mL/min — ABNORMAL LOW
Glucose, Bld: 133 mg/dL — ABNORMAL HIGH (ref 70–99)
Phosphorus: 2.6 mg/dL (ref 2.5–4.6)
Potassium: 4 mmol/L (ref 3.5–5.1)
Sodium: 130 mmol/L — ABNORMAL LOW (ref 135–145)

## 2024-11-09 LAB — FOLATE: Folate: 12.7 ng/mL

## 2024-11-09 LAB — APTT: aPTT: 41 s — ABNORMAL HIGH (ref 24–36)

## 2024-11-09 LAB — CBC
HCT: 30.7 % — ABNORMAL LOW (ref 39.0–52.0)
Hemoglobin: 9.8 g/dL — ABNORMAL LOW (ref 13.0–17.0)
MCH: 25 pg — ABNORMAL LOW (ref 26.0–34.0)
MCHC: 31.9 g/dL (ref 30.0–36.0)
MCV: 78.3 fL — ABNORMAL LOW (ref 80.0–100.0)
Platelets: 181 K/uL (ref 150–400)
RBC: 3.92 MIL/uL — ABNORMAL LOW (ref 4.22–5.81)
RDW: 25 % — ABNORMAL HIGH (ref 11.5–15.5)
WBC: 6.5 K/uL (ref 4.0–10.5)
nRBC: 1.2 % — ABNORMAL HIGH (ref 0.0–0.2)

## 2024-11-09 LAB — GLUCOSE, CAPILLARY
Glucose-Capillary: 145 mg/dL — ABNORMAL HIGH (ref 70–99)
Glucose-Capillary: 116 mg/dL — ABNORMAL HIGH (ref 70–99)
Glucose-Capillary: 220 mg/dL — ABNORMAL HIGH (ref 70–99)
Glucose-Capillary: 261 mg/dL — ABNORMAL HIGH (ref 70–99)
Glucose-Capillary: 189 mg/dL — ABNORMAL HIGH (ref 70–99)
Glucose-Capillary: 163 mg/dL — ABNORMAL HIGH (ref 70–99)

## 2024-11-09 LAB — IRON AND TIBC
Iron: 18 ug/dL — ABNORMAL LOW (ref 45–182)
Saturation Ratios: 8 % — ABNORMAL LOW (ref 17.9–39.5)
TIBC: 232 ug/dL — ABNORMAL LOW (ref 250–450)
UIBC: 215 ug/dL

## 2024-11-09 LAB — RETICULOCYTES
Immature Retic Fract: 33.8 % — ABNORMAL HIGH (ref 2.3–15.9)
RBC.: 3.94 MIL/uL — ABNORMAL LOW (ref 4.22–5.81)
Retic Count, Absolute: 86.7 K/uL (ref 19.0–186.0)
Retic Ct Pct: 2.2 % (ref 0.4–3.1)

## 2024-11-09 LAB — MAGNESIUM: Magnesium: 2 mg/dL (ref 1.7–2.4)

## 2024-11-09 LAB — VITAMIN B12: Vitamin B-12: 1949 pg/mL — ABNORMAL HIGH (ref 180–914)

## 2024-11-09 LAB — PREPARE RBC (CROSSMATCH)

## 2024-11-09 LAB — FERRITIN: Ferritin: 162 ng/mL (ref 24–336)

## 2024-11-09 MED ORDER — SODIUM CHLORIDE 0.9 % IV BOLUS: 500.0000 mL | Freq: Once | INTRAVENOUS | Status: DC

## 2024-11-09 MED ORDER — LIDOCAINE HCL (PF) 1 % IJ SOLN: 5.0000 mL | Freq: Once | INTRAMUSCULAR | Status: DC

## 2024-11-09 MED ORDER — HYDROXYZINE HCL 25 MG PO TABS
25.0000 mg | ORAL_TABLET | Freq: Three times a day (TID) | ORAL | Status: AC | PRN
  Administered 2024-11-09 – 2024-11-10 (×4): 25 mg via ORAL
  Filled 2024-11-09 (×4): qty 1

## 2024-11-09 MED ORDER — LACTATED RINGERS IV BOLUS
250.0000 mL | Freq: Once | INTRAVENOUS | Status: AC
  Administered 2024-11-09: 250 mL via INTRAVENOUS

## 2024-11-09 MED ORDER — SODIUM CHLORIDE 0.9% IV SOLUTION: Freq: Once | INTRAVENOUS | Status: AC

## 2024-11-09 MED ORDER — NOREPINEPHRINE 4 MG/250ML-% IV SOLN
0.0000 ug/min | INTRAVENOUS | Status: DC
  Administered 2024-11-09: 2 ug/min via INTRAVENOUS
  Administered 2024-11-11 (×2): 4 ug/min via INTRAVENOUS
  Administered 2024-11-13: 3 ug/min via INTRAVENOUS
  Administered 2024-11-15: 2 ug/min via INTRAVENOUS
  Administered 2024-11-18: 1 ug/min via INTRAVENOUS
  Administered 2024-11-19: 5 ug/min via INTRAVENOUS
  Administered 2024-11-20: 6 ug/min via INTRAVENOUS
  Filled 2024-11-09 (×4): qty 250

## 2024-11-09 MED ORDER — PANTOPRAZOLE SODIUM 40 MG PO TBEC
40.0000 mg | DELAYED_RELEASE_TABLET | Freq: Two times a day (BID) | ORAL | Status: DC
  Administered 2024-11-09 – 2024-11-11 (×5): 40 mg via ORAL
  Filled 2024-11-09 (×5): qty 1

## 2024-11-09 MED ORDER — SODIUM CHLORIDE 0.9 % IV SOLN: INTRAVENOUS | Status: AC | PRN

## 2024-11-09 NOTE — Progress Notes (Signed)
"  ° °      CROSS COVER NOTE  NAME: [name] GEORGIOU Sr. MRN: [id number] DOB : 1961-10-30    Concern as stated by nurse / staff   Low BP, 70s over 40s, MAP's 59 as measured with A-line Patient currently resting and sleeping, heart rate in the 70s     Pertinent findings on chart review: Progress note reviewed.  ESRD with history of septic shock, transferred from ICU where he needed pressors, currently on midodrine  20 mg 3 times a day, last dose was 3 hours ago.  Last dialysis yesterday next dialysis for Friday  Patient Assessment    11/09/2024    8:15 PM 11/09/2024    8:00 PM 11/09/2024    7:45 PM  Vitals with BMI  Pulse 75 75 75   BP 70s over 40s  Patient evaluated at bedside along with nurse Physical Exam Vitals and nursing note reviewed.  Constitutional:      General: He is not in acute distress.    Comments: Somnolent  Cardiovascular:     Rate and Rhythm: Normal rate and regular rhythm.     Heart sounds: Normal heart sounds.  Pulmonary:     Effort: Pulmonary effort is normal.     Breath sounds: Normal breath sounds.  Neurological:     General: No focal deficit present.      Assessment and  Interventions   Assessment:  Persistent hypotension, recent septic shock  Plan: Discussed with PCCM Dr. Owens Fairly via phone and will see patient NS bolus Addendum: BP down to the 60s-> Levophed  ordered given--Dr Fairly updated and will take to their service X X       CRITICAL CARE Performed by: Delayne LULLA Solian   Total critical care time: 31 minutes  Critical care time was exclusive of separately billable procedures and treating other patients.  Critical care was necessary to treat or prevent imminent or life-threatening deterioration.  Critical care was time spent personally by me on the following activities: development of treatment plan with patient and/or surrogate as well as nursing, discussions with consultants, evaluation of patient's response to treatment,  examination of patient, obtaining history from patient or surrogate, ordering and performing treatments and interventions, ordering and review of laboratory studies, ordering and review of radiographic studies, pulse oximetry and re-evaluation of patient's condition.   "

## 2024-11-09 NOTE — Clinical Note (Incomplete)
 BOLUS is done, but BP is still low 84/49 with MAP 60. what do you want to do next? thanks

## 2024-11-09 NOTE — Progress Notes (Addendum)
 [facility] Progress Note Patient: [name] [name]. FMW:[id number] DOB: [birth year]  DOA: 10/24/2024 DOS: the patient was seen and examined on 11/09/2024  Brief Hospital Course: PMH of ESRD on HD MWF, PAF, chronic HFrEF, HTN, type II DM, LBBB presented to the hospital with complaints of shortness of breath, dizziness.  Was found to have pleural effusion, MRSA bacteremia and multifocal pneumonia. 12/8 admit to [facility] hospital for pneumonia and pleural effusion. 12/10 underwent thoracentesis on left which was transudative.  Blood cultures came back positive for MRSA.  ID was consulted.   12/11 echo EF 20 to 25%.  No vegetation. 12/12 TDC and art line removed 12/15 new femoral HD cath placed by general surgery 12/17 transfer to [facility] for hypotension on Levophed  12/19 HD at bedside. 12/20 CRRT due to persistent volume overload with hypotension. 12/23 Weaned off levophed .  Back on midodrine  only.  Transfer to hospitalist service. 12/24 receiving PRBC transfusion.  Assessment and plan. Septic shock secondary to MRSA bacteremia. Presented with shortness of breath and dizziness.  Was found to have MRSA pneumonia with bacteremia. Initially admitted [facility].  Treated with IV pressors and IV antibiotics in the ICU. Echocardiogram performed which showed no evidence of vegetation. ID was consulted. Repeat cultures remain negative. TEE was recommended although patient with ongoing hypotension therefore not a candidate for conscious sedation right now. Was transferred to [facility] ICU for persistent hypotension and was started on CRRT on 12/20. Currently blood pressure remains low although inaccurate on cuff and therefore has an A-line. It appears that baseline blood pressure is in 120s and 110 systolic. Continue midodrine  20 mg 3 times daily. May require to go back on pressors again if unable to tolerate hemodialysis.  MRSA bacteremia. 12/8 blood culture was positive.  12/10 the  blood culture has remained negative. Patient had a line holiday for 48 hours. A femoral cath was inserted on 12/15. Currently I am recommending 6 weeks of IV antibiotics.  EOT 12/09/2024. TDC placement have been recommended but currently IR waiting for patient to come out of the ICU.  Acute on chronic hypotension. Chronically patient is on midodrine .  Told the ICU provider that he takes 6 tablets a day.  Told me that he takes 2 tablets twice a day thus not a good historian. Currently on midodrine  20 mg 3 times daily. May require pressors again. Monitor.  Acute on chronic anemia. Etiology is not clear. Iron  level low at 18. Baseline hemoglobin appears to be around 14. Has chronic microcytosis and iron  deficiency. Concern for anemia of chronic kidney disease. No active bleeding reported by the staff or the patient. Patient had similar drop in hemoglobin in January 2025 at which time he had GI bleeding. Resume PPI twice daily for now. Given significant hemodynamic instability, I would recommend 1 PRBC transfusion.  Patient agreed for the transfusion for now. Iron  deficiency management per nephrology with dialysis. Foley catheter adequate 12.7.  B12 liquid 1949.  Reticulocyte count normal although fraction elevated.  Chronic HFrEF. Bolick status elevated. EF 2025%. Severe aortic stenosis. Monitor for now.  ESRD on HD MWF. Chronic hyponatremia. Secondary hyperparathyroidism. Management with nephrology. Last HD was on 12/23.  Type 2 diabetes mellitus, uncontrolled with hyper and hypoglycemia with renal and vascular complication without long-term insulin  use. Currently on sliding scale. Monitor.  History of PAD. On aspirin . Status post right BKA. Monitor.  Severe aortic stenosis. Possible low-flow severe aortic stenosis. Seen by cardiology outpatient. Recommendation is for medical management GDMT  limited in the setting of hypotension. Not on any antiplatelet medication  given concern for anemia. Monitor.  Deconditioning. It appears that the patient is couch bound at his baseline. Reports using wheelchair for mobility. Unsure what is the baseline mentation as well. PT OT consult appreciated.  Goals of care conversation. Patient with multiple comorbidities, chronic hypotension with acute issues with bacteremia in the setting of HD catheter needs. Also has chronic systolic CHF. Will consult palliative care for further conversation.   Subjective: Denies any acute complaint.  No nausea no vomiting.  Ongoing fatigue and tiredness.  No blood in the stool.  Physical Exam: Basal crackles. S1-S2 present Lower extremity edema seen. No calf tenderness. No bowel tenderness.  Data Reviewed: I have Reviewed nursing notes, Vitals, and Lab results. Since last encounter, pertinent lab results CBC and BMP   . I have ordered test including CBC and BMP  . I have discussed pt's care plan and test results with critical care as well as nephrology  .   Disposition: Status is: Inpatient Remains inpatient appropriate because: Monitor for improvement in blood pressure  heparin  injection 5,000 Units Start: 10/24/24 2200  Family Communication: No one at bedside Level of care: Progressive   Vitals:   11/09/24 1430 11/09/24 1445 11/09/24 1500 11/09/24 1551  BP:      Pulse: 77 76 76 77  Resp: 17 (!) 0 (!) 26 14  Temp: 97.6 F (36.4 C)   (!) 97.4 F (36.3 C)  TempSrc: Oral   Oral  SpO2: (!) 89% 96% 97% 95%  Weight:      Height:        The patient is critically ill with multiple organ systems failure and requires high complexity decision making for assessment and support, frequent evaluation and titration of therapies. Critical Care Time devoted to patient care services described in this note is 35 minutes   Author: Yetta Blanch, MD 11/09/2024 4:14 PM  Please look on [email or web address] to find out who is on call.

## 2024-11-09 NOTE — Progress Notes (Signed)
 RT attempted to place arterial line x2, and was unsuccessful. RN and provider aware. Second RT came to attempt, however patient refused and requested provider to place.

## 2024-11-09 NOTE — Evaluation (Signed)
 Physical Therapy Evaluation Patient Details Name: [name] [name] [name] Sr. MRN: [id number] DOB: 06/22/1961 Today's Date: 11/09/2024  History of Present Illness  [name] [name] Sr. is a 63 y.o. male who presented to [facility] ED on 10/24/2024 with dyspnea x roughly 2-3 weeks. Pt placed on CRRT for volume overload, off 12/22. PMH: ESRD on HD MWF, A-fib, systolic CHF, hypertension, LBBB, DM.   Clinical Impression  Pt admitted with above. PTA pt was living with brother and reports staying on couch all day. Pt reports using w/c primarily with no ambulation despite having R prosthesis. Pt reports doing own dressing/bathing but brother aides in donning R prosthesis and brings him all meals. Pt with prolonged hospital stay and currently has a L femoral HD line limited EOB/OOB mobility progression at this time. Suspect pt to be very deconditioned from prolonged hospital stay and would benefit from inpatient rehab program < 3 hours a day to allow for increased time to achieve safe level of function for return home with brother. Pt with flat affect and questionable motivation and effort level which may affect patients mobility progression. Pt was cooperative with PT during today's eval and completed all exercises.  Acute PT to cont to follow.      If plan is discharge home, recommend the following: A lot of help with bathing/dressing/bathroom;Assist for transportation;Help with stairs or ramp for entrance   Can travel by private vehicle   No    Equipment Recommendations None recommended by PT  Recommendations for Other Services       Functional Status Assessment Patient has had a recent decline in their functional status and demonstrates the ability to make significant improvements in function in a reasonable and predictable amount of time.     Precautions / Restrictions Precautions Precautions: Fall Precaution/Restrictions Comments: R BKA, has prosthesis Required Braces or Orthoses: Other Brace Other  Brace: has R prosthesis      Mobility  Bed Mobility               General bed mobility comments: RN staff transferred pt OOB to recliner with maxisky, with verbal cues pt was able to assist with sliding back in chair using bilat UEs however ultimately still required maxA due to limited use of L UE    Transfers                   General transfer comment: unable due to L femoral line    Ambulation/Gait               General Gait Details: pt non-ambulatory at baseline  Stairs            Wheelchair Mobility     Tilt Bed    Modified Rankin (Stroke Patients Only)       Balance Overall balance assessment: Needs assistance (not tested)                                           Pertinent Vitals/Pain Pain Assessment Pain Assessment: Faces Faces Pain Scale: Hurts a little bit Pain Location: L hand when touched    Home Living Family/patient expects to be discharged to:: Private residence Living Arrangements: Other relatives (brother) Available Help at Discharge: Family;Available 24 hours/day (pt reports brother to be retired) Type of Home: House Home Access: Stairs to enter Entrance Stairs-Rails: Right;Left;Can reach both [employer] of Steps:  4   Home Layout: One level Home Equipment: [employer]ant (2 wheels);Wheelchair - manual;Shower seat;BSC/3in1;Grab bars - tub/shower;Wheelchair - power;Tub bench Additional Comments: uses Right BKA prosthetic leg    Prior Function Prior Level of Function : Needs assist             Mobility Comments: pt reports only completing std pvt transfers to/from w/c to tub bench, commode, bed, couch ADLs Comments: assist for donning R prosthesis, brother makes food and brings to patient, patient reports he does his own dressing and bathing     Extremity/Trunk Assessment   Upper Extremity Assessment Upper Extremity Assessment: LUE deficits/detail LUE Deficits / Details: pt  reports I can't do anything with this hand. It hurts.  pt able to raise arm up above 90 deg without difficulty    Lower Extremity Assessment Lower Extremity Assessment: RLE deficits/detail;LLE deficits/detail RLE Deficits / Details: BKA, good knee and hip ROM LLE Deficits / Details: pt denies sensation from mid shin down through foot, limited hip movement due to L femoral HD line, pt with minimal active DF/PF/wiggling toes LLE Sensation: decreased light touch;history of peripheral neuropathy LLE Coordination: decreased gross motor    Cervical / Trunk Assessment Cervical / Trunk Assessment: Normal  Communication   Communication Communication: No apparent difficulties;Other (comment)    Cognition Arousal: Alert Behavior During Therapy: Flat affect   PT - Cognitive impairments: No family/caregiver present to determine baseline                       PT - Cognition Comments: pt irritated RN staff transferred pt OOB to recliner, effort given during PT eval questionable, appears self limiting Following commands: Intact       Cueing Cueing Techniques: Verbal cues     General Comments General comments (skin integrity, edema, etc.): L foot/lower LE edema    Exercises General Exercises - Lower Extremity Ankle Circles/Pumps: AAROM, Left, 10 reps, Seated (with LEs elevated) Quad Sets: AROM, Both, 10 reps Gluteal Sets: AROM, Both, 10 reps, Seated Short Arc Quad: AROM, Both, 10 reps, Seated Hip Flexion/Marching: AROM, Right, 10 reps, Seated (with LEs elevated)   Assessment/Plan    PT Assessment Patient needs continued PT services  PT Problem List Decreased strength;Decreased range of motion;Decreased activity tolerance;Decreased balance;Decreased mobility       PT Treatment Interventions DME instruction;Gait training;Stair training;Functional mobility training;Therapeutic activities;Therapeutic exercise;Balance training    PT Goals (Current goals can be found in the  Care Plan section)  Acute Rehab PT Goals Patient Stated Goal: get better PT Goal Formulation: With patient Time For Goal Achievement: 11/23/24 Potential to Achieve Goals: Good    Frequency Min 2X/week     Co-evaluation               AM-PAC PT 6 Clicks Mobility  Outcome Measure Help needed turning from your back to your side while in a flat bed without using bedrails?: A Lot Help needed moving from lying on your back to sitting on the side of a flat bed without using bedrails?: A Lot Help needed moving to and from a bed to a chair (including a wheelchair)?: Total Help needed standing up from a chair using your arms (e.g., wheelchair or bedside chair)?: Total Help needed to walk in hospital room?: Total Help needed climbing 3-5 steps with a railing? : Total 6 Click Score: 8    End of Session   Activity Tolerance: Other (comment) (limited by L femoral  HD line)  Patient left: in chair;with nursing/sitter in room Nurse Communication: Mobility status PT Visit Diagnosis: Muscle weakness (generalized) (M62.81)    Time: 8864-8850 PT Time Calculation (min) (ACUTE ONLY): 14 min   Charges:   PT Evaluation $PT Eval Low Complexity: 1 Low   PT General Charges $$ ACUTE PT VISIT: 1 Visit         Norene Ames, PT, DPT Acute Rehabilitation Services Secure chat preferred Office #: [phone]   Norene CHRISTELLA Ames 11/09/2024, 12:44 PM

## 2024-11-09 NOTE — Progress Notes (Addendum)
 [facility] NEPHROLOGY PROGRESS NOTE  Assessment/ Plan: Pt is a 63 y.o. yo male   Dialysis Orders: MWF [facility] 4h  B400  101.9kg  2K bath  TDC  Hep 6000 - Hectoral 5mcg IV q HD - No ESA/iron   # ESRD: Usual MWF. HD here 12/19. CRRT started for vol overload w/ shock on 12/20-12/22. - IHD yesterday with around 1.5 L UF.  However patient developed hypotension afterward required LR overnight.  Currently on midodrine  20 mg 3 times daily with SBP in 90s.  Getting a unit of PRBC.  Plan for next HD on Friday.  # Septic/cardiogenic shock due to MRSA bacteremia: Off of Levophed  this morning, on midodrine , UF with IHD.  TDC was removed, status post temporary HD line.IR consulted for new line - plan is to wait until off pressors and out of ICU.  # Acute hypoxic respiratory failure due to fluid overload, acute systolic CHF and pneumonia: UF with HD, on antibiotics per PCCM.  #Hyponatremia: hypervolemic due to vol overload.  Continue UF and fluid restriction.  # Anemia of ESRD: Hgb acceptable, no ESA needed.  Primary team is considering PRBC transfusion today.  #Secondary HPTH: CorrCa/Phos ok - continue home meds.  On Cinacalcet  and sevelamer .  ** Holiday Schedule for THIS week** Usual MWF schedule -> Sun (21), Tues (23), Friday (26) Usual TTS schedule -> Mon (22), Wed (24), Sat (27)   Subjective: Seen and examined in ICU.  Noted hypotensive episodes overnight required a line.  SBP in 90s.  He denies dizziness, chest pain, shortness of breath.  Discussed with ICU team.  Objective Vital signs in last 24 hours: Vitals:   11/09/24 0900 11/09/24 0915 11/09/24 0930 11/09/24 0945  BP:      Pulse: 74 74 74 73  Resp: 19 13 19  (!) 21  Temp:      TempSrc:      SpO2: 94% 95% 91% 94%  Weight:      Height:       Weight change: 0 kg  Intake/Output Summary (Last 24 hours) at 11/09/2024 1012 Last data filed at 11/09/2024 0856 Gross per 24 hour  Intake 581.48 ml  Output 1500 ml   Net -918.52 ml       Labs: RENAL PANEL Recent Labs  Lab 11/05/24 0316 11/05/24 1551 11/06/24 0248 11/06/24 1608 11/07/24 0532 11/08/24 0436 11/09/24 0606  NA 122*   < > 122* 126* 130* 128* 130*  K 4.5   < > 4.4 4.6 4.2 4.5 4.0  CL 87*   < > 88* 93* 96* 97* 97*  CO2 19*   < > 20* 18* 22 21* 22  GLUCOSE 189*   < > 91 118* 62* 217* 133*  BUN 36*   < > 30* 23 20 22 19   CREATININE 6.63*   < > 5.81* 4.44* 4.00* 4.15* 3.85*  CALCIUM  7.7*   < > 7.8* 7.9* 8.1* 7.7* 7.8*  MG 1.8  --  2.2  --  2.2 2.1 2.0  PHOS 4.2   < > 4.1 3.4 3.2 2.9 2.6  ALBUMIN   --    < > 2.9* 3.0* 3.0* 2.7* 3.0*   < > = values in this interval not displayed.    Liver Function Tests: Recent Labs  Lab 11/07/24 0532 11/08/24 0436 11/09/24 0606  ALBUMIN  3.0* 2.7* 3.0*   No results for input(s): LIPASE, AMYLASE in the last 168 hours. No results for input(s): AMMONIA in the last 168 hours. CBC:  Recent Labs    11/16/23 1518 11/17/23 0245 11/24/23 1306 11/25/23 1108 11/30/23 1351 12/01/23 0246 11/04/24 0346 11/05/24 0316 11/06/24 0248 11/07/24 0532 11/09/24 0607  HGB  --    < >  --    < >  --    < > 11.5* 11.6* 11.3* 10.5* 9.8*  MCV  --    < >  --    < >  --    < > 75.1* 75.3* 75.6* 77.0* 78.3*  VITAMINB12 2,058*  --   --   --   --   --   --   --   --   --   --   FOLATE 16.6  --   --   --   --   --   --   --   --   --   --   FERRITIN  --   --  421*  --  342*  --   --   --   --   --   --   TIBC  --   --  294  --  322  --   --   --   --   --   --   IRON   --   --  46  --  30*  --   --   --   --   --   --    < > = values in this interval not displayed.    Cardiac Enzymes: No results for input(s): CKTOTAL, CKMB, CKMBINDEX, TROPONINI in the last 168 hours. CBG: Recent Labs  Lab 11/08/24 1928 11/08/24 2159 11/08/24 2323 11/09/24 0314 11/09/24 0745  GLUCAP 114* 118* 125* 145* 116*    Iron  Studies: No results for input(s): IRON , TIBC, TRANSFERRIN, FERRITIN in the last 72  hours. Studies/Results: No results found.   Medications: Infusions:  sodium chloride      albumin  human     anticoagulant sodium citrate       Scheduled Medications:  sodium chloride    Intravenous Once   aspirin  EC  81 mg Oral Q breakfast   Chlorhexidine  Gluconate Cloth  6 each Topical Daily   Chlorhexidine  Gluconate Cloth  6 each Topical Q0600   cinacalcet   60 mg Oral Q breakfast   feeding supplement  237 mL Oral BID BM   guaiFENesin   600 mg Oral BID   heparin   5,000 Units Subcutaneous Q8H   insulin  aspart  0-15 Units Subcutaneous TID WC   insulin  aspart  0-5 Units Subcutaneous QHS   insulin  glargine  8 Units Subcutaneous Daily   lidocaine  (PF)  5 mL Other Once   lidocaine   10 mL Intradermal Once   midodrine   20 mg Oral TID WC   multivitamin  1 tablet Oral QHS   pantoprazole   40 mg Oral BID   sevelamer  carbonate  4,000 mg Oral TID WC   vancomycin  variable dose per unstable renal function (pharmacist dosing)   Does not apply See admin instructions    have reviewed scheduled and prn medications.  Physical Exam: General:NAD, comfortable, in room air Heart:RRR, s1s2 nl Lungs:clear b/l, no crackle Abdomen:soft, Non-tender, non-distended Extremities: Bilateral upper and lower extremities edema dependent edema Dialysis Access: Left femoral temporary catheter.  Cason Luffman Prasad Paxtyn Boyar 11/09/2024,10:12 AM  LOS: 16 days

## 2024-11-09 NOTE — Progress Notes (Addendum)
 eLink Physician-Brief Progress Note Patient Name: [name] STROTHER Sr. DOB: 1960-12-04 MRN: [id number]   Date of Service  11/09/2024  HPI/Events of Note  Notified of hypotension with SBP 70s-80s, MAP less than 60.  Pt with chronic hypotension already on midodrine .  Pt with ESRD on HD. Last HD on 11/08/24.  Pt is awake and alert.  BP 80/45, HR 75, RR 19, O2 sats 96%   eICU Interventions  Give 250cc LR bolus.      Intervention Category Intermediate Interventions: Hypotension - evaluation and management  Shanda Busman 11/09/2024, 8:37 PM  9:38 PM BP now at 92/54, MAP 67 but on recheck, SBP back into the 70s.   Plan> Restart levophed .

## 2024-11-09 NOTE — Procedures (Signed)
 Arterial Line Insertion Start/End12/24/2025 5:20 AM, 11/09/2024 5:37 AM  Patient location: ICU. Preanesthetic checklist: patient identified and site marked Patient sedated Right, radial was placed Catheter size: 20 G Hand hygiene performed , maximum sterile barriers used  and Seldinger technique used Allen's test indicative of satisfactory collateral circulation Attempts: 1 Following insertion, dressing applied and Biopatch. Patient tolerated the procedure well with no immediate complications.

## 2024-11-09 NOTE — Consult Note (Signed)
 "  NAME:  [name] [name]., MRN:  [id number], DOB:  12-09-1960, LOS: 16 ADMISSION DATE:  10/24/2024, CONSULTATION DATE:  11/09/24 REFERRING MD:  Delayne Solian, MD, CHIEF COMPLAINT:  Hypotension  History of Present Illness:  [name] MARLA Gretel Sr. is a 63 y.o. male who has a PMH as outlined below including but not limited to ESRD on HD MWF, A-fib, systolic CHF, hypertension, LBBB, DM.  He presented to [facility] ED on 10/24/2024 with dyspnea x roughly 2-3 weeks. He had been seen in the ED a few days early on 12/2 for dysuria and was started on a course of Cipro  which she started the following day and stated that he had been taking as prescribed.     CT chest that demonstrated multifocal pneumonia and small effusions.  Blood cultures ultimately grew MRSA.  He had been on vancomycin  which was continued. He had a left thora 12/10 with 450cc fluid removed.  His HD catheter was removed on 10/28/2024 as well as an arterial line.  He did have a TTE on 12/11 that showed an EF of 20-25%, G2 DD, mild decrease in RV SF, severe AAS, no vegetations.  TEE was recommended but had been deferred due to patient requiring vasopressor support.  ID was consulted and recommended line holiday.  He had a temporary femoral HD cath placed on 12/15.  ID recommendations included 6 weeks of vancomycin  to end on 12/09/2024.   12/8 admit to [facility] for pneumonia and pleural effusion. 12/10 underwent thoracentesis on left which was transudative.  Blood cultures came back positive for MRSA.  ID was consulted.   12/11 echo EF 20 to 25%.  No vegetation. 12/12 TDC and art line removed 12/15 new femoral HD cath placed by general surgery 12/17 transfer to [facility] for hypotension on Levophed  12/19 HD at bedside. 12/20 CRRT due to persistent volume overload with hypotension. 12/23 Weaned off levophed .  Back on midodrine  only.  Transfer to hospitalist service. 12/24 receiving PRBC transfusion. 12/24 remains hypotensive and MAPs  below 60's  Pertinent  Medical History  SRD on HD MWF, A-fib, systolic CHF, hypertension, LBBB, DM  Significant Hospital Events: Including procedures, antibiotic start and stop dates in addition to other pertinent events   As outlined above  Interim History / Subjective:  Remains hypotensive despite Midodrine   Objective    Blood pressure (!) 87/32, pulse 74, temperature 97.6 F (36.4 C), temperature source Oral, resp. rate 16, height 6' 1 (1.854 m), weight 112.2 kg, SpO2 97%.        Intake/Output Summary (Last 24 hours) at 11/09/2024 2114 Last data filed at 11/09/2024 1800 Gross per 24 hour  Intake 1428.34 ml  Output --  Net 1428.34 ml   Filed Weights   11/08/24 0834 11/08/24 1245 11/09/24 0500  Weight: 111.9 kg 110.4 kg 112.2 kg    Examination: General: Alert awake , NAD HENT: PERRLA no icterus, neck supple Lungs: CTA no wheezes or rales Cardiovascular: reg s1s2 no murmurs Abdomen: soft nt nd bs pos, no guarding, left groin dialysis cath Extremities: no edema or cyanosis Neuro: AAOx3, CN II to XII grossly intact GU: n/a  Resolved problem list   Assessment and Plan  Chronic hypotension  Was weaned off pressors 2 days ago  Now Hypotensive, SBP 80's and MAPS low 60's, high 50's, asymptomatic  Recent Pneumonia  Getting fluid bolus, may require IV vasopressors Pneumonia and MRSA bacteremia  Vanco x 6 weeks, completed 5 days Ceftriaxone , does not  look septic ESRD on HD  Received dialysis yesterday, 1.5 liters off, next dialysis on Friday  Nephrology follow for HD  Secondary Hypoparathyroidism Severe Aortic stenosis HFrEF 20-25%  12/11 TTE EF 20-25%, G2DD, mild decrease RVF; severe AS, no vegetation. Seen by outpatient cardiology 12/2023 and inpatient in 11/2023, noted severe AS, plan is to be managed medically given multiple comorbidities. Did not tolerate DAPT in past so due to bleeding, remains just on ASA.  DMT2 FS and SSI Microcytic anemia Anemia of chronic  kidney disease Hgb steady. No signs of bleeding. Trend CBC.  Hx of PVD: No signs of infection at R [facility]. On home ASA.  Anticoagulation: heparin  SQ  Transfer to ICU if remains hypotensive after fluid bolus Labs   CBC: Recent Labs  Lab 11/04/24 0346 11/05/24 0316 11/06/24 0248 11/07/24 0532 11/09/24 0607  WBC 8.1 8.0 7.1 7.9 6.5  HGB 11.5* 11.6* 11.3* 10.5* 9.8*  HCT 34.9* 35.0* 34.0* 32.2* 30.7*  MCV 75.1* 75.3* 75.6* 77.0* 78.3*  PLT 335 342 260 211 181    Basic Metabolic Panel: Recent Labs  Lab 11/05/24 0316 11/05/24 1551 11/06/24 0248 11/06/24 1608 11/07/24 0532 11/08/24 0436 11/09/24 0606  NA 122*   < > 122* 126* 130* 128* 130*  K 4.5   < > 4.4 4.6 4.2 4.5 4.0  CL 87*   < > 88* 93* 96* 97* 97*  CO2 19*   < > 20* 18* 22 21* 22  GLUCOSE 189*   < > 91 118* 62* 217* 133*  BUN 36*   < > 30* 23 20 22 19   CREATININE 6.63*   < > 5.81* 4.44* 4.00* 4.15* 3.85*  CALCIUM  7.7*   < > 7.8* 7.9* 8.1* 7.7* 7.8*  MG 1.8  --  2.2  --  2.2 2.1 2.0  PHOS 4.2   < > 4.1 3.4 3.2 2.9 2.6   < > = values in this interval not displayed.   GFR: Estimated Creatinine Clearance: 26.1 mL/min (A) (by C-G formula based on SCr of 3.85 mg/dL (H)). Recent Labs  Lab 11/05/24 0316 11/06/24 0248 11/07/24 0532 11/09/24 0607  WBC 8.0 7.1 7.9 6.5  LATICACIDVEN  --   --  1.6  --     Liver Function Tests: Recent Labs  Lab 11/06/24 0248 11/06/24 1608 11/07/24 0532 11/08/24 0436 11/09/24 0606  ALBUMIN  2.9* 3.0* 3.0* 2.7* 3.0*   No results for input(s): LIPASE, AMYLASE in the last 168 hours. No results for input(s): AMMONIA in the last 168 hours.  ABG    Component Value Date/Time   PHART 7.42 11/11/2023 1225   PCO2ART 36 11/11/2023 1225   PO2ART 59 (L) 11/11/2023 1225   HCO3 33.0 (H) 02/11/2024 1506   TCO2 27 03/30/2024 0757   ACIDBASEDEF 0.6 11/11/2023 1225   O2SAT 63.1 11/03/2024 0831     Coagulation Profile: No results for input(s): INR, PROTIME in the last 168  hours.  Cardiac Enzymes: No results for input(s): CKTOTAL, CKMB, CKMBINDEX, TROPONINI in the last 168 hours.  HbA1C: Hgb A1c MFr Bld  Date/Time Value Ref Range Status  10/24/2024 10:14 AM 8.6 (H) 4.8 - 5.6 % Final    Comment:    (NOTE) Diagnosis of Diabetes The following HbA1c ranges recommended by the American Diabetes Association (ADA) may be used as an aid in the diagnosis of diabetes mellitus.  Hemoglobin             Suggested A1C NGSP%  Diagnosis  <5.7                   Non Diabetic  5.7-6.4                Pre-Diabetic  >6.4                   Diabetic  <7.0                   Glycemic control for                       adults with diabetes.    02/11/2024 02:27 PM 8.4 (H) 4.8 - 5.6 % Final    Comment:    (NOTE) Pre diabetes:          5.7%-6.4%  Diabetes:              >6.4%  Glycemic control for   <7.0% adults with diabetes     CBG: Recent Labs  Lab 11/09/24 0314 11/09/24 0745 11/09/24 1121 11/09/24 1553 11/09/24 1915  GLUCAP 145* 116* 220* 261* 189*    Review of Systems:   Has no complaints  Past Medical History:  He,  has a past medical history of ESRD on hemodialysis (HCC), Heart failure with mid-range ejection fraction (HCC), Hypertension, Nonischemic cardiomyopathy (HCC), Nonobstructive coronary artery disease, Obesity, Peripheral arterial disease, Peripheral vascular disease, and Type 2 diabetes mellitus (HCC).   Surgical History:   Past Surgical History:  Procedure Laterality Date   A/V FISTULAGRAM N/A 08/15/2019   Procedure: A/V FISTULAGRAM;  Surgeon: Sheree Penne Bruckner, MD;  Location: [facility] INVASIVE CV LAB;  Service: Cardiovascular;  Laterality: N/A;   A/V FISTULAGRAM N/A 01/21/2024   Procedure: A/V Fistulagram;  Surgeon: Melia [name] ORN, MD;  Location: [facility] INVASIVE CV LAB;  Service: Cardiovascular;  Laterality: N/A;   AMPUTATION Right 02/02/2022   Procedure: AMPUTATION 5th RAY;  Surgeon: Joya Stabs, MD;  Location: [facility]  OR;  Service: Podiatry;  Laterality: Right;   AMPUTATION Right 05/23/2022   Procedure: RIGHT BELOW KNEE AMPUTATION;  Surgeon: Harden Jerona GAILS, MD;  Location: [facility] OR;  Service: Orthopedics;  Laterality: Right;   AMPUTATION Right 07/04/2022   Procedure: REVISION AMPUTATION BELOW KNEE;  Surgeon: Harden Jerona GAILS, MD;  Location: [facility] OR;  Service: Orthopedics;  Laterality: Right;   APPLICATION OF WOUND VAC  05/23/2022   Procedure: APPLICATION OF WOUND VAC;  Surgeon: Harden Jerona GAILS, MD;  Location: [facility] OR;  Service: Orthopedics;;   AV FISTULA PLACEMENT     CARDIAC CATHETERIZATION  02/17/2018   LHC 02/17/18 ([name]): 30% pLAD, 30% dRCA, LVEDP 25-30, LVEF 25%.     CATARACT EXTRACTION W/PHACO Right 03/29/2020   Procedure: CATARACT EXTRACTION PHACO AND INTRAOCULAR LENS PLACEMENT (IOC)  RIGHT DIABETIC;  Surgeon: Mittie Gaskin, MD;  Location: [facility] ORS;  Service: Ophthalmology;  Laterality: Right;  Lot # [id number] H US : 02:30.9 AP% 9.5% CDE: 14.38   EYE SURGERY Right    cataract removed   [name] N/A 12/02/2023   Procedure: [name];  Surgeon: Federico Rosario BROCKS, MD;  Location: [facility] ENDOSCOPY;  Service: Gastroenterology;  Laterality: N/A;   I & D EXTREMITY Right 02/02/2022   Procedure: IRRIGATION AND DEBRIDEMENT RIGHT FOOT;  Surgeon: Joya Stabs, MD;  Location: [facility] OR;  Service: Podiatry;  Laterality: Right;   INCISION AND DRAINAGE OF WOUND Right 02/08/2022   Procedure: IRRIGATION AND DEBRIDEMENT WOUND;  Surgeon: Burt Fus, DPM;  Location:  [facility] OR;  Service: Podiatry;  Laterality: Right;  with removal of infected bone    INSERTION OF DIALYSIS CATHETER  08/13/2019   Procedure: Insertion Of Dialysis Catheter;  Surgeon: Oris Krystal FALCON, MD;  Location: [facility] OR;  Service: Vascular;;   INSERTION OF DIALYSIS CATHETER Left 07/19/2020   Procedure: INSERTION OF DIALYSIS CATHETER;  Surgeon: Sheree Penne Bruckner, MD;  Location: [facility] OR;  Service: Vascular;  Laterality: Left;   IR FLUORO GUIDE  CV LINE LEFT  02/16/2024   IR LUMBAR PUNCTURE  11/20/2023   IR US  GUIDE BX ASP/DRAIN  11/07/2022   IR US  GUIDE VASC ACCESS LEFT  02/16/2024   IRRIGATION AND DEBRIDEMENT ABSCESS     PERIPHERAL VASCULAR BALLOON ANGIOPLASTY  08/15/2019   Procedure: PERIPHERAL VASCULAR BALLOON ANGIOPLASTY;  Surgeon: Sheree Penne Bruckner, MD;  Location: [facility] INVASIVE CV LAB;  Service: Cardiovascular;;   PERIPHERAL VASCULAR BALLOON ANGIOPLASTY Left 02/06/2022   Procedure: PERIPHERAL VASCULAR BALLOON ANGIOPLASTY;  Surgeon: Gretta Bruckner PARAS, MD;  Location: [facility] INVASIVE CV LAB;  Service: Cardiovascular;  Laterality: Left;   PERIPHERAL VASCULAR BALLOON ANGIOPLASTY  01/21/2024   Procedure: PERIPHERAL VASCULAR BALLOON ANGIOPLASTY;  Surgeon: Melia [name] ORN, MD;  Location: [facility] INVASIVE CV LAB;  Service: Cardiovascular;;  Cephalic Arch   RADIOLOGY WITH ANESTHESIA N/A 11/20/2023   Procedure: MRI WITH ANESTHESIA;  Surgeon: Radiologist, Medication, MD;  Location: [facility] OR;  Service: Radiology;  Laterality: N/A;   RADIOLOGY WITH ANESTHESIA N/A 11/20/2023   Procedure: IR WITH ANESTHESIA;  Surgeon: Alona Corners, DO;  Location: [facility] OR;  Service: Anesthesiology;  Laterality: N/A;  PLEASE OBTAIN OPENING PRESSURE AND DRAIN AT LEAST 30CC'S OF CSF.   REVISON OF ARTERIOVENOUS FISTULA Left 07/19/2020   Procedure: REVISON/PLICATION OF ARTERIOVENOUS FISTULA LEFT;  Surgeon: Sheree Penne Bruckner, MD;  Location: [facility] OR;  Service: Vascular;  Laterality: Left;   THROMBECTOMY AND REVISION OF ARTERIOVENTOUS (AV) GORETEX  GRAFT Left 08/13/2019   Procedure: THROMBECTOMY OF LEFT ARM ARTERIOVENTOUS (AV) FISTULA;  Surgeon: Oris Krystal FALCON, MD;  Location: [facility] OR;  Service: Vascular;  Laterality: Left;   TOE AMPUTATION     UPPER EXTREMITY VENOGRAPHY Bilateral 03/30/2024   Procedure: UPPER EXTREMITY VENOGRAPHY;  Surgeon: Lanis Fonda BRAVO, MD;  Location: [facility] INVASIVE CV LAB;  Service: Cardiovascular;  Laterality: Bilateral;     Social History:   reports that he has never  smoked. He has never used smokeless tobacco. He reports current alcohol use. He reports that he does not use drugs.   Family History:  His family history includes Hypertension in his mother.   Allergies Allergies[1]   Home Medications  Prior to Admission medications  Medication Sig Start Date End Date Taking? Authorizing Provider  acetaminophen  (TYLENOL ) 500 MG tablet Take 1,000 mg by mouth 2 (two) times daily as needed for moderate pain (pain score 4-6), headache or fever.   Yes [provider]  aspirin  EC 81 MG tablet Take 1 tablet (81 mg total) by mouth daily with breakfast. Swallow whole. 02/17/24  Yes Emokpae, Courage, MD  cinacalcet  (SENSIPAR ) 30 MG tablet Take 30 mg by mouth daily.   Yes [provider]  ciprofloxacin  (CIPRO ) 500 MG tablet Take 1 tablet (500 mg total) by mouth 2 (two) times daily. One po bid x 7 days 10/18/24  Yes Delo, Vicenta, MD  doxycycline  (VIBRAMYCIN ) 100 MG capsule Take 1 capsule (100 mg total) by mouth 2 (two) times daily. One po bid x 7 days 10/16/24  Yes Zammit, Joseph, MD  gabapentin  (NEURONTIN ) 300  MG capsule Take 300 mg by mouth daily.   Yes [provider]  insulin  isophane & regular human KwikPen (NOVOLIN  70/30 KWIKPEN) (70-30) 100 UNIT/ML KwikPen Inject 30U in the morning and 25U in the evening Patient taking differently: Inject 25-30 Units into the skin See admin instructions. Inject 30 Units in the morning and 25 Units in the evening 10/27/23  Yes Ezenduka, Nkeiruka J, MD  midodrine  (PROAMATINE ) 10 MG tablet Take 10 mg by mouth 3 (three) times daily. 06/10/24  Yes [provider]  vancomycin  IVPB Inject 1,000 mg into the vein every Monday, Wednesday, and Friday with hemodialysis. Indication:  MRSA Bacteremia Last Day of Therapy:  12/09/24 Labs - Once weekly: Vanc [place], CBC 10/31/24 12/09/24 Yes Vu, Constance DASEN, MD  Insulin  Pen Needle 32G X 4 MM MISC Use with insulin  pen. 10/28/23   Donnamarie Lebron PARAS, MD  pantoprazole   (PROTONIX ) 40 MG tablet Take 1 tablet (40 mg total) by mouth 2 (two) times daily. Patient not taking: Reported on 10/24/2024 01/13/24 06/07/24  Verdene Purchase, MD     Critical care time: 25   The patient is critically ill with multiple organ system failure and requires high complexity decision making for assessment and support, frequent evaluation and titration of therapies, advanced monitoring, review of radiographic studies and interpretation of complex data.   Critical Care Time devoted to patient care services, exclusive of separately billable procedures, described in this note is 32 minutes.   Orlin Fairly, MD [facility] See Amion for pager  If no response to pager , please call [phone] until 7pm After 7:00 pm call Elink  [phone] 11/09/2024, 9:14 PM            [1]  Allergies Allergen Reactions   Sulfa Antibiotics Diarrhea and Nausea And Vomiting   "

## 2024-11-10 DIAGNOSIS — E1122 Type 2 diabetes mellitus with diabetic chronic kidney disease: Secondary | ICD-10-CM | POA: Diagnosis not present

## 2024-11-10 DIAGNOSIS — N186 End stage renal disease: Secondary | ICD-10-CM | POA: Diagnosis not present

## 2024-11-10 DIAGNOSIS — J188 Other pneumonia, unspecified organism: Secondary | ICD-10-CM | POA: Diagnosis not present

## 2024-11-10 DIAGNOSIS — J90 Pleural effusion, not elsewhere classified: Secondary | ICD-10-CM | POA: Diagnosis not present

## 2024-11-10 DIAGNOSIS — Z515 Encounter for palliative care: Secondary | ICD-10-CM

## 2024-11-10 DIAGNOSIS — I132 Hypertensive heart and chronic kidney disease with heart failure and with stage 5 chronic kidney disease, or end stage renal disease: Secondary | ICD-10-CM | POA: Diagnosis not present

## 2024-11-10 DIAGNOSIS — I9589 Other hypotension: Secondary | ICD-10-CM | POA: Diagnosis not present

## 2024-11-10 DIAGNOSIS — D631 Anemia in chronic kidney disease: Secondary | ICD-10-CM | POA: Diagnosis not present

## 2024-11-10 DIAGNOSIS — I5022 Chronic systolic (congestive) heart failure: Secondary | ICD-10-CM | POA: Diagnosis not present

## 2024-11-10 DIAGNOSIS — I350 Nonrheumatic aortic (valve) stenosis: Secondary | ICD-10-CM | POA: Diagnosis not present

## 2024-11-10 LAB — TYPE AND SCREEN
ABO/RH(D): A POS
Antibody Screen: NEGATIVE
Unit division: 0

## 2024-11-10 LAB — CBC WITH DIFFERENTIAL/PLATELET
Abs Immature Granulocytes: 0.04 K/uL (ref 0.00–0.07)
Basophils Absolute: 0.1 K/uL (ref 0.0–0.1)
Basophils Relative: 1 %
Eosinophils Absolute: 0.5 K/uL (ref 0.0–0.5)
Eosinophils Relative: 7 %
HCT: 33.2 % — ABNORMAL LOW (ref 39.0–52.0)
Hemoglobin: 10.9 g/dL — ABNORMAL LOW (ref 13.0–17.0)
Immature Granulocytes: 1 %
Lymphocytes Relative: 12 %
Lymphs Abs: 1 K/uL (ref 0.7–4.0)
MCH: 25.8 pg — ABNORMAL LOW (ref 26.0–34.0)
MCHC: 32.8 g/dL (ref 30.0–36.0)
MCV: 78.7 fL — ABNORMAL LOW (ref 80.0–100.0)
Monocytes Absolute: 1.2 K/uL — ABNORMAL HIGH (ref 0.1–1.0)
Monocytes Relative: 16 %
Neutro Abs: 4.9 K/uL (ref 1.7–7.7)
Neutrophils Relative %: 63 %
Platelets: 201 K/uL (ref 150–400)
RBC: 4.22 MIL/uL (ref 4.22–5.81)
RDW: 23.9 % — ABNORMAL HIGH (ref 11.5–15.5)
Smear Review: NORMAL
WBC: 7.8 K/uL (ref 4.0–10.5)
nRBC: 1.9 % — ABNORMAL HIGH (ref 0.0–0.2)

## 2024-11-10 LAB — GLUCOSE, CAPILLARY
Glucose-Capillary: 211 mg/dL — ABNORMAL HIGH (ref 70–99)
Glucose-Capillary: 193 mg/dL — ABNORMAL HIGH (ref 70–99)
Glucose-Capillary: 157 mg/dL — ABNORMAL HIGH (ref 70–99)
Glucose-Capillary: 97 mg/dL (ref 70–99)

## 2024-11-10 LAB — RENAL FUNCTION PANEL
Albumin: 3.1 g/dL — ABNORMAL LOW (ref 3.5–5.0)
Anion gap: 15 (ref 5–15)
BUN: 24 mg/dL — ABNORMAL HIGH (ref 8–23)
CO2: 18 mmol/L — ABNORMAL LOW (ref 22–32)
Calcium: 7.7 mg/dL — ABNORMAL LOW (ref 8.9–10.3)
Chloride: 95 mmol/L — ABNORMAL LOW (ref 98–111)
Creatinine, Ser: 4.75 mg/dL — ABNORMAL HIGH (ref 0.61–1.24)
GFR, Estimated: 13 mL/min — ABNORMAL LOW
Glucose, Bld: 234 mg/dL — ABNORMAL HIGH (ref 70–99)
Phosphorus: 3.2 mg/dL (ref 2.5–4.6)
Potassium: 4.5 mmol/L (ref 3.5–5.1)
Sodium: 128 mmol/L — ABNORMAL LOW (ref 135–145)

## 2024-11-10 LAB — BPAM RBC
Blood Product Expiration Date: 202601112359
ISSUE DATE / TIME: 202512241213
Unit Type and Rh: 6200

## 2024-11-10 LAB — MAGNESIUM: Magnesium: 2.1 mg/dL (ref 1.7–2.4)

## 2024-11-10 LAB — APTT: aPTT: 34 s (ref 24–36)

## 2024-11-10 MED ORDER — SODIUM CHLORIDE 0.9 % IV SOLN
250.0000 mg | Freq: Every day | INTRAVENOUS | Status: AC
  Administered 2024-11-10 – 2024-11-11 (×2): 250 mg via INTRAVENOUS
  Filled 2024-11-10 (×2): qty 20

## 2024-11-10 MED ORDER — DARBEPOETIN ALFA 60 MCG/0.3ML IJ SOSY: 60.0000 ug | PREFILLED_SYRINGE | INTRAMUSCULAR | Status: DC

## 2024-11-10 MED ORDER — INSULIN ASPART 100 UNIT/ML IJ SOLN
3.0000 [IU] | Freq: Three times a day (TID) | INTRAMUSCULAR | Status: DC
  Administered 2024-11-10: 3 [IU] via SUBCUTANEOUS
  Filled 2024-11-10 (×2): qty 3

## 2024-11-10 MED ORDER — SEVELAMER CARBONATE 800 MG PO TABS
3200.0000 mg | ORAL_TABLET | Freq: Three times a day (TID) | ORAL | Status: DC
  Administered 2024-11-10 – 2024-11-13 (×11): 3200 mg via ORAL
  Filled 2024-11-10 (×11): qty 4

## 2024-11-10 MED ORDER — MIDODRINE HCL 5 MG PO TABS
20.0000 mg | ORAL_TABLET | Freq: Three times a day (TID) | ORAL | Status: DC
  Administered 2024-11-10 – 2024-11-30 (×60): 20 mg via ORAL
  Filled 2024-11-10 (×41): qty 4

## 2024-11-10 MED ORDER — INSULIN GLARGINE 100 UNIT/ML ~~LOC~~ SOLN
12.0000 [IU] | Freq: Every day | SUBCUTANEOUS | Status: DC
  Administered 2024-11-10: 12 [IU] via SUBCUTANEOUS
  Filled 2024-11-10 (×2): qty 0.12

## 2024-11-10 MED ORDER — DARBEPOETIN ALFA 60 MCG/0.3ML IJ SOSY
60.0000 ug | PREFILLED_SYRINGE | INTRAMUSCULAR | Status: DC
  Administered 2024-11-10: 60 ug via SUBCUTANEOUS
  Filled 2024-11-10: qty 0.3

## 2024-11-10 MED ORDER — CHLORHEXIDINE GLUCONATE CLOTH 2 % EX PADS
6.0000 | MEDICATED_PAD | Freq: Every day | CUTANEOUS | Status: DC
  Administered 2024-11-10 – 2024-11-12 (×3): 6 via TOPICAL

## 2024-11-10 NOTE — Progress Notes (Addendum)
 [facility] NEPHROLOGY PROGRESS NOTE  Assessment/ Plan: Pt is a 63 y.o. yo male   Dialysis Orders: MWF [facility] 4h  B400  101.9kg  2K bath  TDC  Hep 6000 - Hectoral 5mcg IV q HD - No ESA/iron   # ESRD: Usual MWF. HD here 12/19. CRRT started for vol overload w/ shock on 12/20-12/22. The dialysis has been challenging because of persistent hypotension.  He received IVF and showing some evidence of fluid overload.  Please do not administer IV fluid if patient is asymptomatic because of ESRD and CHF. -We will plan to do iHD tomorrow.  May not tolerate much UF because of hypotension.  # Septic/cardiogenic shock due to MRSA bacteremia: Back on Levophed .  TDC was removed, status post temporary HD line.IR consulted for new line - plan is to wait until off pressors and out of ICU.  # Hypotension/shock: On midodrine  20 mg 3 times daily, back on Levophed .  # Acute hypoxic respiratory failure due to fluid overload, acute systolic CHF and pneumonia: UF with HD, on antibiotics per PCCM.  #Hyponatremia: hypervolemic due to vol overload.  Please avoid IV fluid.  # Anemia of ESRD: Received PRBC, iron  saturation 8%, I will order IV iron  and start erythropoietin.  #Secondary HPTH: CorrCa/Phos ok - continue home meds.  On Cinacalcet  and sevelamer .  ** Holiday Schedule for THIS week** Usual MWF schedule -> Sun (21), Tues (23), Friday (26) Usual TTS schedule -> Mon (22), Wed (24), Sat (27)  Subjective: Seen and examined in ICU.  Noted he received PRBC.  Blood pressure remains low therefore back on Levophed .  Discussed with ICU team.  Objective Vital signs in last 24 hours: Vitals:   11/10/24 0645 11/10/24 0700 11/10/24 0715 11/10/24 0805  BP:      Pulse: 85 83 86   Resp: (!) 25 (!) 28 (!) 29   Temp:    97.9 F (36.6 C)  TempSrc:    Oral  SpO2: 95% 98% 97%   Weight:      Height:       Weight change:   Intake/Output Summary (Last 24 hours) at 11/10/2024 1021 Last data filed at  11/10/2024 0700 Gross per 24 hour  Intake 1016.53 ml  Output --  Net 1016.53 ml       Labs: RENAL PANEL Recent Labs  Lab 11/06/24 0248 11/06/24 1608 11/07/24 0532 11/08/24 0436 11/09/24 0606 11/10/24 0625  NA 122* 126* 130* 128* 130* 128*  K 4.4 4.6 4.2 4.5 4.0 4.5  CL 88* 93* 96* 97* 97* 95*  CO2 20* 18* 22 21* 22 18*  GLUCOSE 91 118* 62* 217* 133* 234*  BUN 30* 23 20 22 19  24*  CREATININE 5.81* 4.44* 4.00* 4.15* 3.85* 4.75*  CALCIUM  7.8* 7.9* 8.1* 7.7* 7.8* 7.7*  MG 2.2  --  2.2 2.1 2.0 2.1  PHOS 4.1 3.4 3.2 2.9 2.6 3.2  ALBUMIN  2.9* 3.0* 3.0* 2.7* 3.0* 3.1*    Liver Function Tests: Recent Labs  Lab 11/08/24 0436 11/09/24 0606 11/10/24 0625  ALBUMIN  2.7* 3.0* 3.1*   No results for input(s): LIPASE, AMYLASE in the last 168 hours. No results for input(s): AMMONIA in the last 168 hours. CBC: Recent Labs    11/16/23 1518 11/17/23 0245 11/24/23 1306 11/25/23 1108 11/30/23 1351 12/01/23 0246 11/05/24 0316 11/06/24 0248 11/07/24 0532 11/09/24 0607 11/09/24 1036 11/10/24 0625  HGB  --    < >  --    < >  --    < >  11.6* 11.3* 10.5* 9.8*  --  10.9*  MCV  --    < >  --    < >  --    < > 75.3* 75.6* 77.0* 78.3*  --  78.7*  VITAMINB12 2,058*  --   --   --   --   --   --   --   --   --  1,949*  --   FOLATE 16.6  --   --   --   --   --   --   --   --   --  12.7  --   FERRITIN  --   --  421*  --  342*  --   --   --   --   --  162  --   TIBC  --   --  294  --  322  --   --   --   --   --  232*  --   IRON   --   --  46  --  30*  --   --   --   --   --  18*  --   RETICCTPCT  --   --   --   --   --   --   --   --   --   --  2.2  --    < > = values in this interval not displayed.    Cardiac Enzymes: No results for input(s): CKTOTAL, CKMB, CKMBINDEX, TROPONINI in the last 168 hours. CBG: Recent Labs  Lab 11/09/24 1121 11/09/24 1553 11/09/24 1915 11/09/24 2227 11/10/24 0802  GLUCAP 220* 261* 189* 163* 211*    Iron  Studies:  Recent Labs     11/09/24 1036  IRON  18*  TIBC 232*  FERRITIN 162   Studies/Results: No results found.   Medications: Infusions:  albumin  human     anticoagulant sodium citrate      norepinephrine  (LEVOPHED ) Adult infusion 2 mcg/min (11/10/24 0700)    Scheduled Medications:  aspirin  EC  81 mg Oral Q breakfast   Chlorhexidine  Gluconate Cloth  6 each Topical Daily   Chlorhexidine  Gluconate Cloth  6 each Topical Q0600   cinacalcet   60 mg Oral Q breakfast   feeding supplement  237 mL Oral BID BM   guaiFENesin   600 mg Oral BID   heparin   5,000 Units Subcutaneous Q8H   insulin  aspart  0-15 Units Subcutaneous TID WC   insulin  aspart  0-5 Units Subcutaneous QHS   insulin  aspart  3 Units Subcutaneous TID WC   insulin  glargine  12 Units Subcutaneous Daily   midodrine   20 mg Oral Q8H   multivitamin  1 tablet Oral QHS   pantoprazole   40 mg Oral BID   sevelamer  carbonate  3,200 mg Oral TID WC   vancomycin  variable dose per unstable renal function (pharmacist dosing)   Does not apply See admin instructions    have reviewed scheduled and prn medications.  Physical Exam: General:NAD, comfortable, somnolent Heart:RRR, s1s2 nl Lungs:clear b/l, no crackle Abdomen:soft, Non-tender, non-distended Extremities: Bilateral upper and lower extremities edema dependent edema Dialysis Access: Left femoral temporary catheter.  Luma Clopper Prasad Brandyn Lowrey 11/10/2024,10:21 AM  LOS: 17 days

## 2024-11-10 NOTE — Consult Note (Addendum)
 "                                                  Palliative Care Consult Note                                  Date: 11/10/2024   Patient Name: [name] [name]  DOB:1961-02-17  FMW:[id number]  Age / Sex:63 y.o., male  PCP: [name] Aquas, MD Referring Physician: Kassie Acquanetta Bradley, MD  Reason for Consultation: Establishing goals of care  Past Medical History:  Diagnosis Date   ESRD on hemodialysis [facility])    a. HD MWF > 10 years.   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 01/2022 Echo: EF 45-50%, glob HK, mod LVH, mildly reduced RV fxn, mild MR, mod-sev MV Ca2+. Mild AS; b. 10/2023 Echo: EF 45-50%, glob HK, GrII DD, mildly reduced RV fxn, RVSP 52.34mmHg. Mild MR/MS, mod TR.   Hypertension    Nonischemic cardiomyopathy (HCC)    a. 2019 Cath [facility]): Nonobstructive disease; b. 12/2019 Echo: EF 50%; c. 12/2019 MV (Tx w/u [facility]): Ant infarct w/ peri-infarct ischemia. EF 35%; c. 05/2021 MV (Tx w/u [facility]): Cor Ca2+, EF 53%, motion artifact, equiv study, likely low risk.   Nonobstructive coronary artery disease    a.  2019 Cath ([facility]): Left main normal, LAD 30 proximal, RCA 30 distal, EF 25%.   Obesity    Peripheral arterial disease    Peripheral vascular disease    Type 2 diabetes mellitus (HCC)    a. 10/2023 - admission w/ HHS.    Assessment & Plan:   HPI/Patient Profile: 63 y.o. male  with past medical history of ESRD on HD, HFrEF (20-25% EF), T2DM, atrial fibrillation admitted on 10/24/2024 with MRSA bacteremia and AHRF 2/2 pneumonia. Per H&P on 10/24/2024 by Pearlean MD, patient presented to [facility] for 2-3 weeks of shortness of breath. CT chest on 10/24/2024 demonstrated multilobar bonchopneumonia. Blood cultures on 10/24/2024 positive for MRSA bacteremia. 10/26/2024 had left thoracentesis with 450 mL transudate fluid removed with negative cultures. Transferred to [facility] 11/02/2024 for management of septic shock and IR evaluation for [facility] placement which  has not been completed due to continued pressor needs.  Palliative medicine consulted for goals of care conversation.   SUMMARY OF RECOMMENDATIONS   Full code, full scope, unable to have meaningful goals of care conversation during initial visit today with unsuccessful attempts to reach family Will continue to attempt goals of care conversations with the patient's HCPOA (daughter [name] [name]) Patient has advanced directive paperwork completed but only appoints daughter as healthcare power of attorney and does not provide directions on healthcare interventions  Symptom Management:  Per primary team  Code Status: Full Code  Prognosis:  Unable to determine  Discharge Planning:  To Be Determined   Discussed with: Kassie MD about attempts to converse with patient and reaching out to family members for discussion but no answer.   Subjective:   Reviewed medical records, received report from team, assessed the patient and then meet at the patient's bedside to discuss diagnosis, prognosis, GOC, EOL wishes disposition and options.  Patient was off levophed  11/08/2024 but restarted on norepinephrine  at 2 mcg/min 11/10/2024 for ongoing hypotension with continued midodrine .  I met with patient at bedside without any visitors. Patient was very lethargic and not able to fully participate in goals of care conversation and permission received from patient to discuss medical information with daughter/HCA [name] [name] at [phone]. Left voicemail for daughter and patient's brother.    We meet to discuss diagnosis prognosis, GOC, EOL wishes, disposition and options. Concept of Palliative Care was introduced as specialized medical care for people and their families living with serious illness.  If focuses on providing relief from the symptoms and stress of a serious illness.  The goal is to improve quality of life for both the patient and the family. Values and goals of care important to patient and  family were attempted to be elicited.  Created space and opportunity for patient  and family to explore thoughts and feelings regarding current medical situation   Natural trajectory and current clinical status were discussed. Questions and concerns addressed. Patient encouraged to call with questions or concerns.    Patient/Family Understanding of Illness: - Patient understands that he is currently in the hospital for dyspnea that was due to pneumonia as well as having a blood stream infection - Inquired about patient's understanding of his overall health concerning his advanced heart failure and patient denied knowing about how weak his heart is  Baseline Status: - Per PT note by [name] [name] PT on 11/09/2024 patient reports living with his brother and uses a wheelchair despite having a prosthesis for his right BKA, brother provides assistance with meals but patient able to perform most ADLs independently  Today's Discussion: - Met with patient bedside without any visitors - Attempted to have goals of care conversation with the patient but he was extremely somnolent and was only able to answer with few words and not able to meaningfully participate in a full goals of care conversation - Requested permission from patient to speak with his daughter who is his designated healthcare power of attorney by documentation and patient agreed - Patient shared that he does know that he came into the hospital for shortness of breath and he is aware that he has a pneumonia as well as a bloodstream infection but unaware of his overall comorbid conditions including his advanced heart failure  Review of Systems  Constitutional:  Positive for fatigue.  All other systems reviewed and are negative.   Objective:   Primary Diagnoses: Present on Admission:  Multifocal pneumonia  Below-knee amputation of right lower extremity (HCC)  Chronic systolic CHF (congestive heart failure) (HCC)  Type 2 diabetes mellitus  with diabetic chronic kidney disease (HCC)  Severe sepsis with septic shock  MRSA bacteremia  Hypotension  CAD (coronary artery disease)  Peripheral vascular disease, unspecified  Vital Signs:  BP (!) 87/32   Pulse 86   Temp 97.9 F (36.6 C) (Oral)   Resp (!) 29   Ht 6' 1 (1.854 m)   Wt 112.2 kg   SpO2 97%   BMI 32.63 kg/m   Physical Exam Constitutional:      Appearance: He is ill-appearing.     Comments: Somnolent but awakes to voice.  HENT:     Head: Normocephalic.  Eyes:     Extraocular Movements: Extraocular movements intact.  Cardiovascular:     Rate and Rhythm: Normal rate.  Pulmonary:     Effort: Pulmonary effort is normal.  Skin:    General: Skin is dry.  Neurological:     Mental Status: He is oriented to person, place, and time.  Psychiatric:     Comments: Flat affect    Palliative Assessment/Data: 50%   Thank you for allowing us  to participate in the care of RONDLE LOHSE Sr. PMT will continue to support holistically.  I personally spent a total of 40 minutes in the care of the patient today including preparing to see the patient, getting/reviewing separately obtained history, referring and communicating with other health care professionals, and documenting clinical information in the EHR.  Signed by: Fairy FORBES Shan DEVONNA Palliative Medicine Team  Team Phone # [phone] (Nights/Weekends)  11/10/2024, 11:39 AM   "

## 2024-11-10 NOTE — Progress Notes (Signed)
 "  NAME:  [name] [name]., MRN:  [id number], DOB:  07/13/1961, LOS: 17 ADMISSION DATE:  10/24/2024, CONSULTATION DATE:  12/17 REFERRING MD:  Ricky - [facility], CHIEF COMPLAINT:  hypotension  History of Present Illness:  [name] VANGILDER Sr. is a 63 y.o. male who has a PMH as outlined below including but not limited to ESRD on HD MWF, A-fib, systolic CHF, hypertension, LBBB, DM.  He presented to [facility] ED on 10/24/2024 with dyspnea x roughly 2-3 weeks. He had been seen in the ED a few days early on 12/2 for dysuria and was started on a course of Cipro  which she started the following day and stated that he had been taking as prescribed.     CT chest that demonstrated multifocal pneumonia and small effusions.  Blood cultures ultimately grew MRSA.  He had been on vancomycin  which was continued. He had a left thora 12/10 with 450cc fluid removed.  His HD catheter was removed on 10/28/2024 as well as an arterial line.  He did have a TTE on 12/11 that showed an EF of 20-25%, G2 DD, mild decrease in RV SF, severe AAS, no vegetations.  TEE was recommended but had been deferred due to patient requiring vasopressor support.  ID was consulted and recommended line holiday.  He had a temporary femoral HD cath placed on 12/15.  ID recommendations included 6 weeks of vancomycin  to end on 12/09/2024.    12/17, he was transferred to [facility] for ongoing management for septic shock as well as for IR evaluation for tunneled HD catheter placement  Pertinent  Medical History  ESRD on HD MWF, Afib, HFrEF, T2DM, LBBB, severe PVD s/p R BKA Past Medical History:  Diagnosis Date   ESRD on hemodialysis (HCC)    a. HD MWF > 10 years.   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 01/2022 Echo: EF 45-50%, glob HK, mod LVH, mildly reduced RV fxn, mild MR, mod-sev MV Ca2+. Mild AS; b. 10/2023 Echo: EF 45-50%, glob HK, GrII DD, mildly reduced RV fxn, RVSP 52.58mmHg. Mild MR/MS, mod TR.   Hypertension    Nonischemic  cardiomyopathy (HCC)    a. 2019 Cath [facility]): Nonobstructive disease; b. 12/2019 Echo: EF 50%; c. 12/2019 MV (Tx w/u [facility]): Ant infarct w/ peri-infarct ischemia. EF 35%; c. 05/2021 MV (Tx w/u [facility]): Cor Ca2+, EF 53%, motion artifact, equiv study, likely low risk.   Nonobstructive coronary artery disease    a.  2019 Cath ([facility]): Left main normal, LAD 30 proximal, RCA 30 distal, EF 25%.   Obesity    Peripheral arterial disease    Peripheral vascular disease    Type 2 diabetes mellitus (HCC)    a. 10/2023 - admission w/ HHS.   Significant Hospital Events: Including procedures, antibiotic start and stop dates in addition to other pertinent events   12/8 admit 12/10 L thora with 450cc removed, transudate, neg cultures 12/11 echo 12/12 HD cath and art line removed 12/15 new femoral HD cath placed 12/17 transfer to [facility] 42M 12/19 HD  12/20 CRRT 12/23 Weaned off levophed  this am. HD trial this am. Transferred to [facility] 12/25 Returned to ICU for hypotension  Interim History / Subjective:  Returned to ICU overnight for hypotension. Patient has chronic hypotension on midodrine . Mental status intact and unchanged.  Given 1U PRBC   Given total 500 cc bolus however continued to have low systolic BP so restarted on levophed  2 mcg/min  Appears hypovolemic this am with pitting edema  Objective    Blood pressure (!) 87/32, pulse 86, temperature 97.9 F (36.6 C), temperature source Oral, resp. rate (!) 29, height 6' 1 (1.854 m), weight 112.2 kg, SpO2 97%.        Intake/Output Summary (Last 24 hours) at 11/10/2024 0839 Last data filed at 11/10/2024 0700 Gross per 24 hour  Intake 1496.53 ml  Output --  Net 1496.53 ml   Filed Weights   11/08/24 0834 11/08/24 1245 11/09/24 0500  Weight: 111.9 kg 110.4 kg 112.2 kg   Physical Exam: General: Well-appearing, no acute distress HENT: [place], AT, OP clear, MMM Eyes: EOMI, no scleral icterus Respiratory: Clear to auscultation  bilaterally.  No crackles, wheezing or rales Cardiovascular: RRR, -M/R/G, no JVD GI: BS+, soft, nontender Extremities: Right BKA, LAVF, 2+ pitting lower extremity edema,-tenderness Neuro: AAO x4, CNII-XII grossly intact Psych: Normal mood, normal affect  Imaging, labs and test in EMR in the last 24 hours reviewed independently by me. Pertinent findings below:  CXR 12/18 retrocardiac infiltrate, small left pleural effusion CXR 11/06/24 bilateral infiltrates Na decreasing 128, due to hypervolemia again CO2 18 Bcx 12/18 NGTD Final  Resolved problem list   Assessment and Plan   Acute hypoxic respiratory failure 2/2 multifocal pneumonia - resolved Left pleural transudative effusion s/p thoracentesis 12/10 -On room air -PRN Duonebs  HFrEF 20-25% Severe Aortic stenosis 12/11 TTE EF 20-25%, G2DD, mild decrease RVF; severe AS, no vegetation. Seen by outpatient cardiology 12/2023 and inpatient in 11/2023, noted severe AS, plan is to be managed medically given multiple comorbidities. Did not tolerate DAPT in past so due to bleeding, remains just on ASA.  -GDMT limited in setting of ESRD and pressor need -Volume removal per Nephrology -Continue outpatient cardiology f/u  Septic shock 2/2 MRSA bacteremia, pneumonia - resolved shock. Line holiday with new L femoral HD cath 12/15. S/p ceftriaxone  x 5d. Bcx 12/18 NGTD -Off pressors 12/23. Resumed on 12/24.  Chronic hypotension At this point shock does not appear to be infection (WBC normal, afebrile) related and may be due to poor perfusion in setting of severe heart failure, chronic hypotension and ESRD. Additional fluids is now contributing to hypervolemia and patient may be starting to develop cardiogenic shock due to this Plan: -Wean levophed  off. Discussed with Nephrology and fluids administration should be minimized when patient asymptomatic in the setting of ESRD and heart failure -Plan for HD tomorrow -Midodrine  20 mg TID. Consider  reducing in setting of heart failure however patient aware of adverse effects and wishes to continue this medication. -Vanc x 6 weeks (End date: 12/09/24) -ID requesting to pursue TEE. Scheduled for Friday -Once off pressors and discharged from ICU, will need to consult IR for tunneled cath -ID f/u at [facility] on 12/26/24  -Palliative care consulted regarding GOC with chronic hypotension related to co-morbidities   T2DM A1c 8.6 this month -Increase lantus  8>12U -Add mealtime coverage -SSI   ESRD on HD MWF Secondary hyperparathyroidism Per most recent fistulogram, he has an occluded subclavian vein on the right but patent RIJ vein from May, 2025.  -Has temp L femoral HD catheter.  -Nephrology following. Discussed with Nephrology and fluids administration should be minimized when patient asymptomatic in the setting of ESRD and heart failure -IR has requested patient to be out of ICU and off pressors before pursuing tunneled HD cath. Will need to reconsult at that time -Continue cinacalcet  and sevelamer    Chronic Hyponatremia - worsening. Likely due to hypervolemia -  Daily BMET -HD per schedule  Microcytic anemia Anemia of chronic kidney disease Hgb steady. No signs of bleeding. Trend CBC.   Anticoagulation: heparin  SQ  Hx of PVD: No signs of infection at R BKA. On home ASA.   Critical care time:     The patient is critically ill with acute on chronic hypotension and requires high complexity decision making for assessment and support, frequent evaluation and titration of therapies, application of advanced monitoring technologies and extensive interpretation of multiple databases.  Independent Critical Care Time: 45 Minutes.   Slater Staff, M.D. [facility] Pulmonary/Critical Care Medicine 11/10/2024 8:39 AM   Please see Amion for pager number to reach on-call Pulmonary and Critical Care Team.  "

## 2024-11-10 NOTE — Plan of Care (Signed)
 " Problem: Education: Goal: Knowledge of General Education information will improve Description: Including pain rating scale, medication(s)/side effects and non-pharmacologic comfort measures Outcome: Progressing   Problem: Health Behavior/Discharge Planning: Goal: Ability to manage health-related needs will improve Outcome: Progressing   Problem: Clinical Measurements: Goal: Ability to maintain clinical measurements within normal limits will improve Outcome: Progressing Goal: Will remain free from infection Outcome: Progressing Goal: Diagnostic test results will improve Outcome: Progressing Goal: Respiratory complications will improve Outcome: Progressing Goal: Cardiovascular complication will be avoided Outcome: Progressing   Problem: Activity: Goal: Risk for activity intolerance will decrease Outcome: Progressing   Problem: Nutrition: Goal: Adequate nutrition will be maintained Outcome: Progressing   Problem: Elimination: Goal: Will not experience complications related to bowel motility Outcome: Progressing Goal: Will not experience complications related to urinary retention Outcome: Progressing   Problem: Pain Managment: Goal: General experience of comfort will improve and/or be controlled Outcome: Progressing   Problem: Safety: Goal: Ability to remain free from injury will improve Outcome: Progressing   Problem: Skin Integrity: Goal: Risk for impaired skin integrity will decrease Outcome: Progressing   Problem: Activity: Goal: Ability to tolerate increased activity will improve Outcome: Progressing   Problem: Clinical Measurements: Goal: Ability to maintain a body temperature in the normal range will improve Outcome: Progressing   Problem: Respiratory: Goal: Ability to maintain adequate ventilation will improve Outcome: Progressing Goal: Ability to maintain a clear airway will improve Outcome: Progressing   Problem: Education: Goal: Ability to  describe self-care measures that may prevent or decrease complications (Diabetes Survival Skills Education) will improve Outcome: Progressing Goal: Individualized Educational Video(s) Outcome: Progressing   Problem: Coping: Goal: Ability to adjust to condition or change in health will improve Outcome: Progressing   Problem: Fluid Volume: Goal: Ability to maintain a balanced intake and output will improve Outcome: Progressing   Problem: Health Behavior/Discharge Planning: Goal: Ability to identify and utilize available resources and services will improve Outcome: Progressing Goal: Ability to manage health-related needs will improve Outcome: Progressing   Problem: Metabolic: Goal: Ability to maintain appropriate glucose levels will improve Outcome: Progressing   Problem: Nutritional: Goal: Maintenance of adequate nutrition will improve Outcome: Progressing Goal: Progress toward achieving an optimal weight will improve Outcome: Progressing   Problem: Skin Integrity: Goal: Risk for impaired skin integrity will decrease Outcome: Progressing   Problem: Tissue Perfusion: Goal: Adequacy of tissue perfusion will improve Outcome: Progressing   Problem: Education: Goal: Knowledge of disease and its progression will improve Outcome: Progressing Goal: Individualized Educational Video(s) Outcome: Progressing   Problem: Fluid Volume: Goal: Compliance with measures to maintain balanced fluid volume will improve Outcome: Progressing   Problem: Health Behavior/Discharge Planning: Goal: Ability to manage health-related needs will improve Outcome: Progressing   Problem: Nutritional: Goal: Ability to make healthy dietary choices will improve Outcome: Progressing   Problem: Clinical Measurements: Goal: Complications related to the disease process, condition or treatment will be avoided or minimized Outcome: Progressing   Problem: Education: Goal: Knowledge of disease or  condition will improve Outcome: Progressing Goal: Understanding of medication regimen will improve Outcome: Progressing Goal: Individualized Educational Video(s) Outcome: Progressing   Problem: Activity: Goal: Ability to tolerate increased activity will improve Outcome: Progressing   Problem: Cardiac: Goal: Ability to achieve and maintain adequate cardiopulmonary perfusion will improve Outcome: Progressing   Problem: Health Behavior/Discharge Planning: Goal: Ability to safely manage health-related needs after discharge will improve Outcome: Progressing   Problem: Education: Goal: Ability to demonstrate management of disease process will improve Outcome: Progressing Goal:  Ability to verbalize understanding of medication therapies will improve Outcome: Progressing Goal: Individualized Educational Video(s) Outcome: Progressing   Problem: Activity: Goal: Capacity to carry out activities will improve Outcome: Progressing   "

## 2024-11-11 ENCOUNTER — Inpatient Hospital Stay (HOSPITAL_COMMUNITY)

## 2024-11-11 ENCOUNTER — Encounter (HOSPITAL_COMMUNITY): Admission: EM | Disposition: E | Payer: Self-pay | Source: Home / Self Care | Attending: Pulmonary Disease

## 2024-11-11 DIAGNOSIS — N186 End stage renal disease: Secondary | ICD-10-CM | POA: Diagnosis not present

## 2024-11-11 DIAGNOSIS — Z7189 Other specified counseling: Secondary | ICD-10-CM | POA: Diagnosis not present

## 2024-11-11 DIAGNOSIS — R627 Adult failure to thrive: Secondary | ICD-10-CM | POA: Diagnosis not present

## 2024-11-11 DIAGNOSIS — Z515 Encounter for palliative care: Secondary | ICD-10-CM | POA: Diagnosis not present

## 2024-11-11 DIAGNOSIS — I9589 Other hypotension: Secondary | ICD-10-CM | POA: Diagnosis not present

## 2024-11-11 DIAGNOSIS — I132 Hypertensive heart and chronic kidney disease with heart failure and with stage 5 chronic kidney disease, or end stage renal disease: Secondary | ICD-10-CM | POA: Diagnosis not present

## 2024-11-11 DIAGNOSIS — I5022 Chronic systolic (congestive) heart failure: Secondary | ICD-10-CM | POA: Diagnosis not present

## 2024-11-11 DIAGNOSIS — J90 Pleural effusion, not elsewhere classified: Secondary | ICD-10-CM | POA: Diagnosis not present

## 2024-11-11 DIAGNOSIS — I350 Nonrheumatic aortic (valve) stenosis: Secondary | ICD-10-CM | POA: Diagnosis not present

## 2024-11-11 DIAGNOSIS — E1122 Type 2 diabetes mellitus with diabetic chronic kidney disease: Secondary | ICD-10-CM | POA: Diagnosis not present

## 2024-11-11 DIAGNOSIS — D631 Anemia in chronic kidney disease: Secondary | ICD-10-CM | POA: Diagnosis not present

## 2024-11-11 LAB — APTT: aPTT: 43 s — ABNORMAL HIGH (ref 24–36)

## 2024-11-11 LAB — GLUCOSE, CAPILLARY
Glucose-Capillary: 73 mg/dL (ref 70–99)
Glucose-Capillary: 73 mg/dL (ref 70–99)
Glucose-Capillary: 125 mg/dL — ABNORMAL HIGH (ref 70–99)
Glucose-Capillary: 143 mg/dL — ABNORMAL HIGH (ref 70–99)
Glucose-Capillary: 150 mg/dL — ABNORMAL HIGH (ref 70–99)

## 2024-11-11 LAB — RENAL FUNCTION PANEL
Albumin: 3 g/dL — ABNORMAL LOW (ref 3.5–5.0)
Anion gap: 13 (ref 5–15)
BUN: 29 mg/dL — ABNORMAL HIGH (ref 8–23)
CO2: 22 mmol/L (ref 22–32)
Calcium: 7.5 mg/dL — ABNORMAL LOW (ref 8.9–10.3)
Chloride: 95 mmol/L — ABNORMAL LOW (ref 98–111)
Creatinine, Ser: 5.56 mg/dL — ABNORMAL HIGH (ref 0.61–1.24)
GFR, Estimated: 11 mL/min — ABNORMAL LOW
Glucose, Bld: 69 mg/dL — ABNORMAL LOW (ref 70–99)
Phosphorus: 3.5 mg/dL (ref 2.5–4.6)
Potassium: 4.4 mmol/L (ref 3.5–5.1)
Sodium: 129 mmol/L — ABNORMAL LOW (ref 135–145)

## 2024-11-11 LAB — MAGNESIUM: Magnesium: 2 mg/dL (ref 1.7–2.4)

## 2024-11-11 SURGERY — TRANSESOPHAGEAL ECHOCARDIOGRAM (TEE) (CATHLAB)
Anesthesia: Monitor Anesthesia Care

## 2024-11-11 MED ORDER — INSULIN GLARGINE 100 UNIT/ML ~~LOC~~ SOLN
8.0000 [IU] | Freq: Every day | SUBCUTANEOUS | Status: DC
  Administered 2024-11-11 – 2024-11-16 (×6): 8 [IU] via SUBCUTANEOUS
  Filled 2024-11-11 (×5): qty 0.08

## 2024-11-11 MED ORDER — HYDROXYZINE HCL 25 MG PO TABS
25.0000 mg | ORAL_TABLET | Freq: Three times a day (TID) | ORAL | Status: DC | PRN
  Administered 2024-11-11 – 2024-12-09 (×21): 25 mg via ORAL
  Filled 2024-11-11 (×19): qty 1

## 2024-11-11 MED ORDER — HEPARIN SODIUM (PORCINE) 1000 UNIT/ML IJ SOLN
INTRAMUSCULAR | Status: AC
  Filled 2024-11-11: qty 7

## 2024-11-11 MED ORDER — PANTOPRAZOLE SODIUM 40 MG PO TBEC
40.0000 mg | DELAYED_RELEASE_TABLET | Freq: Every day | ORAL | Status: DC
  Administered 2024-11-12 – 2024-11-30 (×19): 40 mg via ORAL
  Filled 2024-11-11 (×12): qty 1

## 2024-11-11 MED ORDER — PENTAFLUOROPROP-TETRAFLUOROETH EX AERO: 1.0000 | INHALATION_SPRAY | CUTANEOUS | Status: DC | PRN

## 2024-11-11 MED ORDER — LIDOCAINE HCL (PF) 1 % IJ SOLN: 5.0000 mL | INTRAMUSCULAR | Status: DC | PRN

## 2024-11-11 MED ORDER — ALBUMIN HUMAN 25 % IV SOLN
INTRAVENOUS | Status: AC
  Filled 2024-11-11: qty 50

## 2024-11-11 MED ORDER — MIDODRINE HCL 5 MG PO TABS
10.0000 mg | ORAL_TABLET | Freq: Once | ORAL | Status: AC
  Administered 2024-11-11: 10 mg via ORAL
  Filled 2024-11-11: qty 2

## 2024-11-11 MED ORDER — HEPARIN SODIUM (PORCINE) 1000 UNIT/ML DIALYSIS
1000.0000 [IU] | INTRAMUSCULAR | Status: DC | PRN
  Administered 2024-11-11: 2800 [IU]
  Filled 2024-11-11: qty 1

## 2024-11-11 MED ORDER — HEPARIN SODIUM (PORCINE) 1000 UNIT/ML DIALYSIS
20.0000 [IU]/kg | INTRAMUSCULAR | Status: DC | PRN
  Administered 2024-11-11: 2200 [IU] via INTRAVENOUS_CENTRAL
  Filled 2024-11-11: qty 3

## 2024-11-11 MED ORDER — VANCOMYCIN HCL IN DEXTROSE 1-5 GM/200ML-% IV SOLN
1000.0000 mg | Freq: Once | INTRAVENOUS | Status: AC
  Administered 2024-11-11: 1000 mg via INTRAVENOUS
  Filled 2024-11-11: qty 200

## 2024-11-11 MED ORDER — INSULIN ASPART 100 UNIT/ML IJ SOLN
2.0000 [IU] | Freq: Three times a day (TID) | INTRAMUSCULAR | Status: DC
  Administered 2024-11-11 – 2024-11-13 (×5): 2 [IU] via SUBCUTANEOUS
  Filled 2024-11-11 (×5): qty 2

## 2024-11-11 MED ORDER — LIDOCAINE-PRILOCAINE 2.5-2.5 % EX CREA: 1.0000 | TOPICAL_CREAM | CUTANEOUS | Status: DC | PRN

## 2024-11-11 MED ORDER — ANTICOAGULANT SODIUM CITRATE 4% (200MG/5ML) IV SOLN: 5.0000 mL | Status: DC | PRN

## 2024-11-11 NOTE — Plan of Care (Signed)

## 2024-11-11 NOTE — Progress Notes (Signed)
 Pharmacy Antibiotic Note  [name] [name]. is a 63 y.o. male admitted on 10/24/2024 with MRSA bacteremia and PNA  Pharmacy has been consulted for vancomycin  dosing.  Patient off CRRT since 12/22 15:13. Received vancomycin  1250 mg after CRRT discontinued.  Had iHD 12/23 3.5 hours (treatment ended 12:30), Post HD vanc level at 14:36 is 34, which is supratherapeutic. 12/26 iHD again 3.5 hours. Assume ~ 35-40% removal which puts vancomycin  level at 20-22.   Plan: Vancomycin  1000 mg x1 after dialysis today  Consider obtaining pre-HD vancomycin  random level prior to next dialysis   Height: 6' 1 (185.4 cm) Weight: 115.6 kg (254 lb 13.6 oz) IBW/kg (Calculated) : 79.9  Temp (24hrs), Avg:97.9 F (36.6 C), Min:97.6 F (36.4 C), Max:98.6 F (37 C)  Recent Labs  Lab 11/05/24 0316 11/05/24 1551 11/06/24 0248 11/06/24 1608 11/07/24 0532 11/08/24 0436 11/08/24 1426 11/09/24 0606 11/09/24 0607 11/10/24 0625 11/11/24 0329  WBC 8.0  --  7.1  --  7.9  --   --   --  6.5 7.8  --   CREATININE 6.63*   < > 5.81*   < > 4.00* 4.15*  --  3.85*  --  4.75* 5.56*  LATICACIDVEN  --   --   --   --  1.6  --   --   --   --   --   --   VANCORANDOM  --   --   --   --   --   --  34  --   --   --   --    < > = values in this interval not displayed.    Estimated Creatinine Clearance: 18.4 mL/min (A) (by C-G formula based on SCr of 5.56 mg/dL (H)).    Allergies[1]  Antimicrobials this admission: Vanc 12/8>(12/09/24) CTX x1 12/8, 12/19 > 12/23 Azith x1 12/8 Cefepime  12/8  Dose adjustments this admission: 12/20 CRRT started, dose adjusted vancomycin    Microbiology results: 12/18 BCX Ngtd 12/12 BCX: NGx5 12/10 pleural fluid no orgs 12/10 Bcx: NGx5 12/8 Bcx: 4/4 MRSA MRSA PCR: +  Thank you for allowing pharmacy to be a part of this patients care.  Rankin Sams, PharmD, BCPS, BCCCP Clinical Pharmacist    [1]  Allergies Allergen Reactions   Sulfa Antibiotics Diarrhea and Nausea And Vomiting

## 2024-11-11 NOTE — Progress Notes (Signed)
" °   11/11/24 1300  Vitals  Temp 97.7 F (36.5 C)  Pulse Rate 83  Resp (!) 26  BP (!) 108/53  SpO2 98 %  O2 Device Room Air  Weight 115.1 kg  Type of Weight Post-Dialysis  Oxygen Therapy  Pulse Oximetry Type Continuous  Oximetry Probe Site Changed No  Post Treatment  Dialyzer Clearance Lightly streaked  Hemodialysis Intake (mL) 0 mL  Liters Processed 84  Fluid Removed (mL) 500 mL  Tolerated HD Treatment Yes  Post-Hemodialysis Comments please see progress notes   Pt tx done at bedside--8m07 Alert and oriented.  Informed consent signed and in chart.   TX duration:3.5  Patient tolerated well.  Alert, without acute distress.  Hand-off given to patient's nurse.   Access used: L femoral trialysis Access issues: no complications  Total UF removed: 500cc---Dr. Dolan is aware that ths pt had a 15 beat of vtach once then no more ectopy for the rest of the tx Medication(s) given: mididrine 10mg  po x 1--albumin  25% iv x 1   [name] [name] [facility] "

## 2024-11-11 NOTE — [place] FL2 (Signed)
 " [place]  MEDICAID FL2 LEVEL OF CARE FORM     IDENTIFICATION  Patient Name: [name] [name]. Birthdate: 1961/10/12 Sex: male Admission Date (Current Location): 10/24/2024  [facility] and [place] Number:  Producer, Television/film/video and Address:  [facility] [place], [address] [place], [place] [address]      Provider Number: [id number]  Attending Physician Name and Address:  Kassie Acquanetta Bradley, MD  Relative Name and Phone Number:  Jeoffrey Pinal; Daughter; [phone]    Current Level of Care: Hospital Recommended Level of Care: Skilled Nursing Facility Prior Approval Number:    Date Approved/Denied:   PASRR Number: [id number]  Discharge Plan: SNF    Current Diagnoses: Patient Active Problem List   Diagnosis Date Noted   Hemodialysis catheter infection 10/27/2024   Sepsis due to methicillin resistant Staphylococcus aureus (MRSA) (HCC) 10/26/2024   Pleural effusion 10/26/2024   End stage renal disease (HCC) 10/26/2024   Severe sepsis with septic shock 10/25/2024   Multifocal pneumonia 10/24/2024   Gross hematuria 06/07/2024   Dependence on renal dialysis 06/06/2024   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   Late effects of cerebrovascular disease 04/22/2024   Old myocardial infarction 04/22/2024   Uncontrolled diabetes mellitus with hyperglycemia (HCC) 02/11/2024   Orthostatic hypotension 01/12/2024   Iron  deficiency anemia 12/01/2023   Ischemic colitis 11/27/2023   Bloody diarrhea 11/27/2023   Heme positive stool 11/25/2023   Aortic valve stenosis 11/15/2023   Atrial flutter with rapid ventricular response (HCC) 11/10/2023   Repeated falls 10/29/2023   Hyperglycemia 10/24/2023   Disorder of bilirubin metabolism, unspecified 11/20/2022   RUQ abdominal pain 10/30/2022   Cholelithiasis 10/29/2022   Hyperbilirubinemia 10/29/2022   Cellulitis of left lower limb 07/18/2022   Hyperosmolar hyperglycemic state (HHS) (HCC)    Diarrhea    Dehiscence  of amputation stump of right lower extremity (HCC)    Abscess of right lower leg    Pressure injury of skin 07/02/2022   Cellulitis of right leg 07/01/2022   CAD (coronary artery disease) 05/24/2022   Leukocytosis 05/24/2022   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/23/2022   Obesity (BMI 30-39.9) 02/19/2022   Hypotension 02/08/2022   History of CVA  02/05/2022   Carotid stenosis 02/05/2022   MRSA bacteremia    Hyperkalemia 02/03/2022   Subacute osteomyelitis, right ankle and foot (HCC) 02/02/2022   Protein-calorie malnutrition, mild 02/02/2022   GERD (gastroesophageal reflux disease) 02/02/2022   LBBB (left bundle branch block) 08/13/2019   Problem with vascular access 08/13/2019   Dialysis AV fistula malfunction 08/12/2019   ESRD on dialysis (HCC) 09/20/2018   DM2 (diabetes mellitus, type 2) (HCC) 09/20/2018   HTN (hypertension) 09/20/2018   Hypothyroidism, unspecified 07/27/2018   Secondary hyperparathyroidism of renal origin 07/27/2018   Anemia in chronic kidney disease 07/26/2018   Gout, unspecified 07/23/2018   Type 2 diabetes mellitus with diabetic chronic kidney disease (HCC) 07/23/2018   Chronic systolic CHF (congestive heart failure) (HCC) 07/23/2018   Peripheral vascular disease, unspecified 07/23/2018   Insulin  dependent type 2 diabetes mellitus (HCC) 05/14/2018   Nonischemic cardiomyopathy (HCC) 03/17/2018   Essential hypertension 03/17/2018    Orientation RESPIRATION BLADDER Height & Weight     Self, Time, Place, Situation    Continent Weight: 253 lb 12 oz (115.1 kg) Height:  6' 1 (185.4 cm)  BEHAVIORAL SYMPTOMS/MOOD NEUROLOGICAL BOWEL NUTRITION STATUS      Incontinent    AMBULATORY STATUS COMMUNICATION OF NEEDS Skin   Extensive  Assist Verbally PU Stage and Appropriate Care, Other (Comment) (Pressure Injury Coccyx Bilateral Stage 2 - Partial thickness loss of dermis presenting as a shallow open injury with a red, pink wound bed without slough  and Wound Anus Mid)                       Personal Care Assistance Level of Assistance  Bathing, Feeding, Dressing Bathing Assistance: Maximum assistance Feeding assistance: Maximum assistance Dressing Assistance: Maximum assistance     Functional Limitations Info             SPECIAL CARE FACTORS FREQUENCY  PT (By licensed PT), OT (By licensed OT)     PT Frequency: 5x OT Frequency: 5x            Contractures Contractures Info: Not present    Additional Factors Info  Code Status, Isolation Precautions, Allergies, Insulin  Sliding Scale Code Status Info: Full Code Allergies Info: Sulfa Antibiotics   Insulin  Sliding Scale Info: Please see discharge summary Isolation Precautions Info: Contact Precautions (MRSA)     Current Medications (11/11/2024):  This is the current hospital active medication list Current Facility-Administered Medications  Medication Dose Route Frequency Provider Last Rate Last Admin   acetaminophen  (TYLENOL ) tablet 650 mg  650 mg Oral Q6H PRN Tat, Alm, MD   650 mg at 11/10/24 2136   Or   acetaminophen  (TYLENOL ) suppository 650 mg  650 mg Rectal Q6H PRN Tat, Alm, MD       albumin  human 25 % solution            alteplase  (CATHFLO ACTIVASE ) injection 2 mg  2 mg Intracatheter Once PRN Dolan Mateo Larger, MD       anticoagulant sodium citrate  solution 5 mL  5 mL Intracatheter PRN Dolan Mateo Larger, MD       aspirin  EC tablet 81 mg  81 mg Oral Q breakfast Tat, Alm, MD   81 mg at 11/11/24 0844   Chlorhexidine  Gluconate Cloth 2 % PADS 6 each  6 each Topical Q0600 Dolan Mateo Larger, MD   6 each at 11/10/24 1428   cinacalcet  (SENSIPAR ) tablet 60 mg  60 mg Oral Q breakfast Dolan Mateo Larger, MD   60 mg at 11/11/24 0844   Darbepoetin Alfa  (ARANESP ) injection 60 mcg  60 mcg Subcutaneous Q Thu-1800 Kassie Acquanetta Bradley, MD   60 mcg at 11/10/24 2124   feeding supplement (ENSURE PLUS HIGH PROTEIN) liquid 237 mL  237 mL Oral BID BM Kassie Acquanetta Bradley, MD   237 mL at 11/11/24 1030   guaiFENesin  (MUCINEX ) 12 hr tablet 600 mg  600 mg Oral BID Zheng, Michael, DO   600 mg at 11/11/24 9153   guaiFENesin -dextromethorphan  (ROBITUSSIN DM) 100-10 MG/5ML syrup 5 mL  5 mL Oral Q4H PRN Evonnie Alm, MD   5 mL at 11/08/24 1908   heparin  injection 1,000 Units  1,000 Units Intracatheter PRN Dolan Mateo Larger, MD   2,800 Units at 11/11/24 1310   heparin  injection 1,000-6,000 Units  1,000-6,000 Units CRRT PRN Geralynn Charleston, MD   3,000 Units at 11/07/24 1531   heparin  injection 2,200 Units  20 Units/kg Dialysis PRN Dolan Mateo Larger, MD   2,200 Units at 11/11/24 0932   heparin  injection 5,000 Units  5,000 Units Subcutaneous Q8H Tat, David, MD   5,000 Units at 11/11/24 0557   hydrOXYzine  (ATARAX ) tablet 25 mg  25 mg Oral TID PRN Kassie Acquanetta Bradley, MD  insulin  aspart (novoLOG ) injection 0-15 Units  0-15 Units Subcutaneous TID WC Kassie Acquanetta Bradley, MD   2 Units at 11/11/24 1216   insulin  aspart (novoLOG ) injection 2 Units  2 Units Subcutaneous TID WC Kassie Acquanetta Bradley, MD       insulin  glargine (LANTUS ) injection 8 Units  8 Units Subcutaneous Daily Kassie Acquanetta Bradley, MD   8 Units at 11/11/24 1030   ipratropium-albuterol  (DUONEB) 0.5-2.5 (3) MG/3ML nebulizer solution 3 mL  3 mL Nebulization Q4H PRN Tat, Alm, MD   3 mL at 11/04/24 1438   lidocaine  (PF) (XYLOCAINE ) 1 % injection 5 mL  5 mL Intradermal PRN Dolan Mateo Larger, MD       lidocaine -prilocaine  (EMLA ) cream 1 Application  1 Application Topical PRN Bhandari, Dron Prasad, MD       midodrine  (PROAMATINE ) tablet 20 mg  20 mg Oral Q8H Kassie Acquanetta Bradley, MD   20 mg at 11/11/24 0555   multivitamin (RENA-VIT) tablet 1 tablet  1 tablet Oral QHS Kassie Acquanetta Bradley, MD   1 tablet at 11/10/24 2138   norepinephrine  (LEVOPHED ) 4mg  in (0.016 mg/mL) premix infusion  0-40 mcg/min Intravenous Continuous Cleatus Hoof V, MD 18.75 mL/hr at 11/11/24 0600 5 mcg/min at 11/11/24 0600   Oral care mouth  rinse  15 mL Mouth Rinse PRN Tat, Alm, MD       NOREEN ON 11/12/2024] pantoprazole  (PROTONIX ) EC tablet 40 mg  40 mg Oral Daily Kassie Acquanetta Bradley, MD       pentafluoroprop-tetrafluoroeth JUANA) aerosol 1 Application  1 Application Topical PRN Bhandari, Dron Prasad, MD       phenol (CHLORASEPTIC) mouth spray 1 spray  1 spray Mouth/Throat PRN Elicia Sharper, DO   1 spray at 11/04/24 1656   polyethylene glycol (MIRALAX  / GLYCOLAX ) packet 17 g  17 g Oral Daily PRN Tat, Alm, MD       sevelamer  carbonate (RENVELA ) tablet 3,200 mg  3,200 mg Oral TID WC Kassie Acquanetta Bradley, MD   3,200 mg at 11/11/24 1226   trimethobenzamide  (TIGAN ) injection 200 mg  200 mg Intramuscular Q8H PRN Zheng, Michael, DO   200 mg at 11/05/24 0831   vancomycin  (VANCOCIN ) IVPB 1000 mg/200 mL premix  1,000 mg Intravenous Once Kassie Acquanetta Bradley, MD 200 mL/hr at 11/11/24 1257 1,000 mg at 11/11/24 1257   vancomycin  variable dose per unstable renal function (pharmacist dosing)   Does not apply See admin instructions Lendell Latanya BIRCH, [facility]         Discharge Medications: Please see discharge summary for a list of discharge medications.  Relevant Imaging Results:  Relevant Lab Results:   Additional Information SS#: [id number]; HD pt - [facility] MWF 10:20AM  Lauraine FORBES Saa, LCSWA     "

## 2024-11-11 NOTE — Progress Notes (Signed)
 Cardiology asked to consent patient for TEE for bacteremia.  Patient transferred back to the unit still requiring significant amount of vasopressor support with Levophed  and high-dose midodrine .  Palliative care conversation still pending with uncertain clinical course.  We will hold off on TEE for the time being until patient more stable and if this is still within patient's/patient's family GOC.  Please call back cardiology if appropriate.

## 2024-11-11 NOTE — Evaluation (Signed)
 Occupational Therapy Evaluation Patient Details Name: [name] RENNELS Sr. MRN: [id number] DOB: 09-01-1961 Today's Date: 11/11/2024   History of Present Illness   [name] WITHEM Sr. is a 63 y.o. male who presented to [facility] ED on 10/24/2024 with dyspnea x roughly 2-3 weeks. Pt placed on CRRT for volume overload, off 12/22. PMH: ESRD on HD MWF, A-fib, systolic CHF, hypertension, LBBB, DM.     Clinical Impressions At baseline, pt is Independent to Mod I with ADLs, receives assistance for IADLs, performs stand-pivot transfers with Mod I, and uses a manual w/c for functional mobility. Pt now presents with decreased activity tolerance, increased lethargy, increased fatigue, decreased L UE coordination, impaired L UE sensation, decreased B UE strength, and decreased safety and independence with functional tasks. Pt currently demonstrates ability to complete UB ADLs largely with Set up to Mod assist and LB ADLs with Total assist +2. Pt requiring Max assist to reposition in midline in the bed this session with pt declining addressing further mobility due to fatigue and lethargy. Pt functional level with LB ADLs and mobility also currently limited by L femoral line. Pt's VSS on RA throughout session. Pt will benefit from acute skilled OT services to address deficits and increase safety and independence with functional tasks. Post acute discharge, pt will benefit from intensive inpatient skilled rehab services < 3 hours per day to maximize rehab potential.      If plan is discharge home, recommend the following:   Two people to help with walking and/or transfers;Two people to help with bathing/dressing/bathroom;Assistance with cooking/housework;Assist for transportation;Help with stairs or ramp for entrance     Functional Status Assessment   Patient has had a recent decline in their functional status and demonstrates the ability to make significant improvements in function in a reasonable and  predictable amount of time.     Equipment Recommendations   None recommended by OT (Pt already has needed equipment)     Recommendations for Other Services         Precautions/Restrictions   Precautions Precautions: Fall;Other (comment) Precaution/Restrictions Comments: L femoral line; R BKA, has prosthesis Restrictions Weight Bearing Restrictions Per Provider Order: No Other Position/Activity Restrictions: L femoral line     Mobility Bed Mobility Overal bed mobility: Needs Assistance             General bed mobility comments: Pt requiring Max assist to reposition in midline in the bed with pt presenting with Left lean. Pt declined further bed mobility this session due to fatigue and lethargy. Pt also limited by L femoral line    Transfers                   General transfer comment: unable due to L femoral line      Balance Overall balance assessment: Needs assistance (not tested this session)                                         ADL either performed or assessed with clinical judgement   ADL Overall ADL's : Needs assistance/impaired Eating/Feeding: Set up;Bed level Eating/Feeding Details (indicate cue type and reason): Pt reports frustration with decreased coordination in dominant L hand during self-feeding tasks Grooming: Set up;Bed level Grooming Details (indicate cue type and reason): Pt reports frustration with decreased coordinationin /often dropping things with use of dominant L hand during grooming tasks Upper Body  Bathing: Moderate assistance;Bed level   Lower Body Bathing: Total assistance;Bed level;+2 for safety/equipment Lower Body Bathing Details (indicate cue type and reason): funcitonal level currently limited by Left femoral HD line Upper Body Dressing : Moderate assistance;Bed level   Lower Body Dressing: Total assistance;+2 for safety/equipment;Bed level Lower Body Dressing Details (indicate cue type and  reason): funcitonal level currently limited by Left femoral HD line   Toilet Transfer Details (indicate cue type and reason): unable at this time Toileting- Clothing Manipulation and Hygiene: Total assistance;+2 for safety/equipment;Bed level Toileting - Clothing Manipulation Details (indicate cue type and reason): funcitonal level currently limited by Left femoral HD line       General ADL Comments: Pt with decreased activity tolerance and increased lethargy this session     Vision Patient Visual Report: No change from baseline Additional Comments: Vision [facility] for tasks assessed; not formally screened or evaluated     Perception         Praxis         Pertinent Vitals/Pain Pain Assessment Pain Assessment: Faces Faces Pain Scale: Hurts a little bit Pain Location: L UE, worse in hand Pain Descriptors / Indicators: Discomfort, Guarding Pain Intervention(s): Limited activity within patient's tolerance, Monitored during session, Repositioned     Extremity/Trunk Assessment Upper Extremity Assessment Upper Extremity Assessment: Left hand dominant;RUE deficits/detail;LUE deficits/detail RUE Deficits / Details: gross strength 4/5; AROM, coordination, and sensation WFL LUE Deficits / Details: gross strength 3/5, AROM WFL; decreased sensation; decreased fine and gross motor coordination; pt reports decreased strength, sensation, and coordination since prior stroke LUE Sensation: decreased light touch;decreased proprioception;history of peripheral neuropathy LUE Coordination: decreased gross motor;decreased fine motor   Lower Extremity Assessment Lower Extremity Assessment: Defer to PT evaluation   Cervical / Trunk Assessment Cervical / Trunk Assessment: Normal   Communication Communication Communication: No apparent difficulties;Other (comment)   Cognition Arousal: Lethargic, Alert (Pt asleep upon OT arrival. Pt alerting easily, but fluctuating between alert/lethargic during  session.) Behavior During Therapy: [facility] for tasks assessed/performed Cognition: No family/caregiver present to determine baseline, Cognition impaired     Awareness: Intellectual awareness intact, Online awareness intact (Intermittent online awareness)   Attention impairment (select first level of impairment): Alternating attention (deficits may be related to lethargy) Executive functioning impairment (select all impairments): Problem solving, Organization (mild; deficits may be related to lethargy) OT - Cognition Comments: Pt oriented x4 with deficits noted above; however, deficits noted may be due or affected by lethargy. OT plans to further assess next session.                 Following commands: Impaired Following commands impaired: Follows multi-step commands with increased time, Follows one step commands with increased time (Pt requiring mildly increased time to follow commands, suspect due to darden restaurants)     Cueing  General Comments   Cueing Techniques: Verbal cues;Tactile cues;Visual cues  VSS on RA   Exercises     Shoulder Instructions      Home Living Family/patient expects to be discharged to:: Private residence Living Arrangements: Other relatives (brother) Available Help at Discharge: Family;Available 24 hours/day (brother is retired) Type of Home: House Home Access: Stairs to enter Secretary/administrator of Steps: 4 Entrance Stairs-Rails: Right;Left;Can reach both Home Layout: One level     Bathroom Shower/Tub: Tub/shower unit;Sponge bathes at baseline (Pt has tub bench, but reports he typically washes up sitting on the toilet.)   Bathroom Toilet: Handicapped height Bathroom Accessibility: Yes How Accessible: Accessible via wheelchair;Accessible via walker  Home Equipment: Agricultural Consultant (2 wheels);Wheelchair - manual;Shower seat;BSC/3in1;Grab bars - tub/shower;Wheelchair - power;Tub bench   Additional Comments: has Right BKA prosthetic leg      Prior  Functioning/Environment Prior Level of Function : Needs assist             Mobility Comments: Pt reports completing std pvt transfers Mod I to/from w/c to tub bench, commode, bed, couch ADLs Comments: Ind to Mod I with ADLs; assistance to don R LE prosthetic; assist from brother for IADLs    OT Problem List: Decreased strength;Decreased activity tolerance;Decreased coordination   OT Treatment/Interventions: Self-care/ADL training;Therapeutic exercise;DME and/or AE instruction;Energy conservation;Therapeutic activities;Patient/family education;Balance training      OT Goals(Current goals can be found in the care plan section)   Acute Rehab OT Goals Patient Stated Goal: to be able to walk and be as independent as possible OT Goal Formulation: With patient Time For Goal Achievement: 11/25/24 Potential to Achieve Goals: Good ADL Goals Pt Will Perform Grooming: with modified independence;sitting Pt Will Perform Upper Body Bathing: with supervision;sitting Pt Will Perform Lower Body Dressing: with min assist;sitting/lateral leans Pt Will Transfer to Toilet: with min assist;stand pivot transfer;bedside commode (with or without R LE prosthetic donned and with least restricitve AD) Pt Will Perform Toileting - Clothing Manipulation and hygiene: with min assist;sitting/lateral leans Pt/caregiver will Perform Home Exercise Program: Increased strength;Both right and left upper extremity;With theraband;With theraputty;With Supervision;With written HEP provided (increased L UE coordination; increased activity tolerance)   OT Frequency:  Min 1X/week    Co-evaluation              AM-PAC OT 6 Clicks Daily Activity     Outcome Measure Help from another person eating meals?: A Little Help from another person taking care of personal grooming?: A Little Help from another person toileting, which includes using toliet, bedpan, or urinal?: Total Help from another person bathing (including  washing, rinsing, drying)?: A Lot Help from another person to put on and taking off regular upper body clothing?: A Lot Help from another person to put on and taking off regular lower body clothing?: Total 6 Click Score: 12   End of Session Nurse Communication: Other (comment);Mobility status;Precautions (Pt participation limited by fatigue and lethargy. OT POC.)  Activity Tolerance: Patient limited by fatigue;Patient limited by lethargy Patient left: in bed;with call bell/phone within reach;with bed alarm set  OT Visit Diagnosis: Other abnormalities of gait and mobility (R26.89);Other (comment) (decreased activity tolerance)                Time: 1658-1710 OT Time Calculation (min): 12 min Charges:  OT General Charges $OT Visit: 1 Visit OT Evaluation $OT Eval Low Complexity: 1 Low  Margarie Rockey HERO., OTR/L, MA Acute Rehab [phone]   Margarie FORBES Horns 11/11/2024, 5:40 PM

## 2024-11-11 NOTE — Progress Notes (Signed)
 OT Cancellation Note  Patient Details Name: [name] [name] Sr. MRN: [id number] DOB: 02-18-1961   Cancelled Treatment:    Reason Eval/Treat Not Completed: Patient at procedure or test/ unavailable (Per RN, pt preparing for dialysis in room. OT to reattempt to see pt at a later time as appropriate/available.)  Margarie Rockey HERO., OTR/L, MA Acute Rehab [phone]   Margarie FORBES Horns 11/11/2024, 9:07 AM

## 2024-11-11 NOTE — Progress Notes (Addendum)
 [facility] NEPHROLOGY PROGRESS NOTE  Assessment/ Plan: Pt is a 63 y.o. yo male   Dialysis Orders: MWF [facility] 4h  B400  101.9kg  2K bath  TDC  Hep 6000 - Hectoral 5mcg IV q HD - No ESA/iron   # ESRD: Usual MWF. HD here 12/19. CRRT started for vol overload w/ shock on 12/20-12/22. The dialysis has been challenging because of persistent hypotension.  BP remains low with ongoing need of Levophed .  Exam with evidence of fluid overload.  Plan to do HD today with some ultrafiltration as tolerated.  Also on midodrine , albumin  and lower dialysate temperature to mitigate intradialytic hypotension.  May not be a candidate for outpatient dialysis if he has ongoing need of Levophed .  Noted palliative care team is following.  # Septic/cardiogenic shock due to MRSA bacteremia: Back on Levophed .  TDC was removed, status post temporary HD line.IR consulted for new line - plan is to wait until off pressors and out of ICU.  # Hypotension/shock: On midodrine  20 mg 3 times daily, back on Levophed .  # Acute hypoxic respiratory failure due to fluid overload, acute systolic CHF and pneumonia: UF with HD, on antibiotics per PCCM.  #Hyponatremia: hypervolemic due to vol overload.  Please avoid IV fluid.  # Anemia of ESRD: Received PRBC, iron  saturation 8%, high ferritin due to inflammation, I ordered IV iron  and started erythropoietin.  #Secondary HPTH: CorrCa/Phos ok - continue home meds.  On Cinacalcet  and sevelamer .  ** Holiday Schedule for THIS week** Usual MWF schedule -> Sun (21), Tues (23), Friday (26) Usual TTS schedule -> Mon (22), Wed (24), Sat (27)  Subjective: Seen and examined in ICU.  Remains hypotensive and ongoing need of Levophed .  Denies headache, dizziness, nausea or vomiting.  Getting ready to get dialysis.  Discussed with the nursing staff and ICU team.  Objective Vital signs in last 24 hours: Vitals:   11/11/24 0930 11/11/24 0945 11/11/24 1000 11/11/24 1015  BP: (!)  116/54 (!) 121/59 129/61 (!) 126/58  Pulse: 84 82 82 84  Resp: 19 16 15 18   Temp:      TempSrc:      SpO2: 98% 97% 96% 95%  Weight:      Height:       Weight change:   Intake/Output Summary (Last 24 hours) at 11/11/2024 1021 Last data filed at 11/11/2024 0600 Gross per 24 hour  Intake 500.75 ml  Output --  Net 500.75 ml       Labs: RENAL PANEL Recent Labs  Lab 11/07/24 0532 11/08/24 0436 11/09/24 0606 11/10/24 0625 11/11/24 0329 11/11/24 0330  NA 130* 128* 130* 128* 129*  --   K 4.2 4.5 4.0 4.5 4.4  --   CL 96* 97* 97* 95* 95*  --   CO2 22 21* 22 18* 22  --   GLUCOSE 62* 217* 133* 234* 69*  --   BUN 20 22 19  24* 29*  --   CREATININE 4.00* 4.15* 3.85* 4.75* 5.56*  --   CALCIUM  8.1* 7.7* 7.8* 7.7* 7.5*  --   MG 2.2 2.1 2.0 2.1  --  2.0  PHOS 3.2 2.9 2.6 3.2 3.5  --   ALBUMIN  3.0* 2.7* 3.0* 3.1* 3.0*  --     Liver Function Tests: Recent Labs  Lab 11/09/24 0606 11/10/24 0625 11/11/24 0329  ALBUMIN  3.0* 3.1* 3.0*   No results for input(s): LIPASE, AMYLASE in the last 168 hours. No results for input(s): AMMONIA in the last  168 hours. CBC: Recent Labs    11/16/23 1518 11/17/23 0245 11/24/23 1306 11/25/23 1108 11/30/23 1351 12/01/23 0246 11/05/24 0316 11/06/24 0248 11/07/24 0532 11/09/24 0607 11/09/24 1036 11/10/24 0625  HGB  --    < >  --    < >  --    < > 11.6* 11.3* 10.5* 9.8*  --  10.9*  MCV  --    < >  --    < >  --    < > 75.3* 75.6* 77.0* 78.3*  --  78.7*  VITAMINB12 2,058*  --   --   --   --   --   --   --   --   --  1,949*  --   FOLATE 16.6  --   --   --   --   --   --   --   --   --  12.7  --   FERRITIN  --   --  421*  --  342*  --   --   --   --   --  162  --   TIBC  --   --  294  --  322  --   --   --   --   --  232*  --   IRON   --   --  46  --  30*  --   --   --   --   --  18*  --   RETICCTPCT  --   --   --   --   --   --   --   --   --   --  2.2  --    < > = values in this interval not displayed.    Cardiac Enzymes: No  results for input(s): CKTOTAL, CKMB, CKMBINDEX, TROPONINI in the last 168 hours. CBG: Recent Labs  Lab 11/10/24 1129 11/10/24 1510 11/10/24 2002 11/11/24 0736 11/11/24 0738  GLUCAP 193* 157* 97 73 73    Iron  Studies:  Recent Labs    11/09/24 1036  IRON  18*  TIBC 232*  FERRITIN 162   Studies/Results: No results found.   Medications: Infusions:  albumin  human     anticoagulant sodium citrate      ferric gluconate (FERRLECIT) IVPB 135 mL/hr at 11/10/24 1413   norepinephrine  (LEVOPHED ) Adult infusion 5 mcg/min (11/11/24 0600)    Scheduled Medications:  aspirin  EC  81 mg Oral Q breakfast   Chlorhexidine  Gluconate Cloth  6 each Topical Q0600   cinacalcet   60 mg Oral Q breakfast   darbepoetin (ARANESP ) injection - DIALYSIS  60 mcg Subcutaneous Q Thu-1800   feeding supplement  237 mL Oral BID BM   guaiFENesin   600 mg Oral BID   heparin   5,000 Units Subcutaneous Q8H   insulin  aspart  0-15 Units Subcutaneous TID WC   insulin  aspart  2 Units Subcutaneous TID WC   insulin  glargine  8 Units Subcutaneous Daily   midodrine   20 mg Oral Q8H   multivitamin  1 tablet Oral QHS   [START ON 11/12/2024] pantoprazole   40 mg Oral Daily   sevelamer  carbonate  3,200 mg Oral TID WC   vancomycin  variable dose per unstable renal function (pharmacist dosing)   Does not apply See admin instructions    have reviewed scheduled and prn medications.  Physical Exam: General:NAD, comfortable, somnolent Heart:RRR, s1s2 nl Lungs:clear b/l, no crackle Abdomen:soft, Non-tender, non-distended Extremities: Bilateral upper and lower extremities edema  dependent edema Dialysis Access: Left femoral temporary catheter.  Jahziel Sinn Prasad Taylore Hinde 11/11/2024,10:21 AM  LOS: 18 days

## 2024-11-11 NOTE — TOC Progression Note (Signed)
 Transition of Care (TOC) - Progression Note    Patient Details  Name: [name] DOBOSZ Sr. MRN: [id number] Date of Birth: Apr 26, 1961  Transition of [facility]) CM/SW Contact  Lauraine FORBES Saa, LCSWA Phone Number: 11/11/2024, 1:36 PM  Clinical Narrative:     1:36 PM CSW introduced self and role to patient. CSW informed patient of therapy's recommendation of patient discharging to SNF. Patient was agreeable with recommendation and consented CSW to send patient's referral to SNFs. CSW sent patient's FL2 to SNFs in [facility]. CSW will continue to follow.  Expected Discharge Plan: Skilled Nursing Facility Barriers to Discharge: Continued Medical Work up, English As A Second Language Teacher, SNF Pending bed offer               Expected Discharge Plan and Services In-house Referral: Clinical Social Work   Post Acute Care Choice: Skilled Nursing Facility Living arrangements for the past 2 months: Single Family Home                                       Social Drivers of Health (SDOH) Interventions SDOH Screenings   Food Insecurity: No Food Insecurity (10/24/2024)  Housing: Low Risk (10/24/2024)  Transportation Needs: No Transportation Needs (10/24/2024)  Utilities: Not At Risk (10/24/2024)  Alcohol Screen: Low Risk (02/04/2022)  Depression (PHQ2-9): Low Risk (08/06/2022)  Tobacco Use: Low Risk (10/26/2024)    Readmission Risk Interventions    10/25/2024    1:31 PM 02/11/2024    9:09 PM 11/05/2022    3:08 PM  Readmission Risk Prevention Plan  Transportation Screening Complete Complete Complete  Medication Review Oceanographer) Complete Complete Complete  PCP or Specialist appointment within 3-5 days of discharge Not Complete Complete Complete  HRI or Home Care Consult Complete Complete Complete  SW Recovery Care/Counseling Consult Complete  Complete  Palliative Care Screening Not Applicable Not Applicable Complete  Skilled Nursing Facility Not Complete Complete  Complete

## 2024-11-11 NOTE — Progress Notes (Signed)
 "                                                  Palliative Care Progress Note                                  Date: 11/11/2024   Patient Name: [name] [name]  DOB:1961-04-01  FMW:[id number]  Age / Sex:63 y.o., male  PCP: [name], MD Referring Physician: Kassie Acquanetta Bradley, MD  Reason for Consultation: Establishing goals of care  Past Medical History:  Diagnosis Date   ESRD on hemodialysis [facility])    a. HD MWF > 10 years.   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 01/2022 Echo: EF 45-50%, glob HK, mod LVH, mildly reduced RV fxn, mild MR, mod-sev MV Ca2+. Mild AS; b. 10/2023 Echo: EF 45-50%, glob HK, GrII DD, mildly reduced RV fxn, RVSP 52.44mmHg. Mild MR/MS, mod TR.   Hypertension    Nonischemic cardiomyopathy (HCC)    a. 2019 Cath [facility]): Nonobstructive disease; b. 12/2019 Echo: EF 50%; c. 12/2019 MV (Tx w/u [facility]): Ant infarct w/ peri-infarct ischemia. EF 35%; c. 05/2021 MV (Tx w/u [facility]): Cor Ca2+, EF 53%, motion artifact, equiv study, likely low risk.   Nonobstructive coronary artery disease    a.  2019 Cath ([facility]): Left main normal, LAD 30 proximal, RCA 30 distal, EF 25%.   Obesity    Peripheral arterial disease    Peripheral vascular disease    Type 2 diabetes mellitus (HCC)    a. 10/2023 - admission w/ HHS.     Assessment & Plan:   HPI/Patient Profile: 63 y.o. male  with past medical history of ESRD on HD, HFrEF (20-25% EF), T2DM, atrial fibrillation admitted on 10/24/2024 with MRSA bacteremia and AHRF 2/2 pneumonia. Per H&P on 10/24/2024 by Pearlean MD, patient [facility] for 2-3 weeks of shortness of breath. CT chest on 10/24/2024 demonstrated multilobar bonchopneumonia. Blood cultures on 10/24/2024 positive for MRSA bacteremia. 10/26/2024 had left thoracentesis with 450 mL transudate fluid removed with negative cultures. Transferred to [facility] 11/02/2024 for management of septic shock and IR evaluation for [facility] placement  which has not been completed due to continued pressor needs.   Palliative medicine consulted for goals of care conversation.   SUMMARY OF RECOMMENDATIONS   Full code, full scope Updated daughter/HCPOA [name] [name]) today who wants to maintain full code at this time and will follow up after conversations with her uncles PMT will continue to follow for more conversations  Symptom Management:  Per primary team  Code Status: Full Code  Prognosis:  Unable to determine  Discharge Planning:  To Be Determined   Discussed with: Kassie MD 11/11/2024 about the family's desire for more discussion before considering a change in code status.   Subjective:   Reviewed medical records, received report from team, assessed the patient and then meet at the patient's bedside to discuss diagnosis, prognosis, GOC, EOL wishes disposition and options.  Patient receiving HD today and requiring norepinephrine  at 5 mcg compared to 11/10/2024 when patient was on norepinephrine  2 mcg.   I met with patient at bedside who does not want to engage in much conversation. Spoke on phone with patient's daughter [name] [name]).  We meet to discuss diagnosis prognosis, GOC, EOL wishes, disposition and options. Concept of Palliative Care was introduced as specialized medical care for people and their families living with serious illness.  If focuses on providing relief from the symptoms and stress of a serious illness.  The goal is to improve quality of life for both the patient and the family. Values and goals of care important to patient and family were attempted to be elicited.  Created space and opportunity for patient  and family to explore thoughts and feelings regarding current medical situation   Natural trajectory and current clinical status were discussed. Questions and concerns addressed. Patient encouraged to call with questions or concerns.    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[employer], holiday representative, and educational psychologist, the last time the patient held a regular job was in the late 2000s - Brother shares that due to lack of job stability the patient had to resort to selling drugs for which he was sent to jail from around 2011 to 2018 - Patient has been living with his older brother [name] [name]) all his life before he was incarcerated and continues to do so after incarceration - Patient has not had a job ever since getting out of incarceration in 2018  Baseline Status: - Brother shares that the normal day for the patient is that he lays around in bed and watches videos on his phone - Brother shares that the patient does not have much motivation to do anything else besides doing the minimum for himself - Brother has had to assist patient more with transfers and movement from wheelchair to bed in the last couple of weeks due to the patient's increased weakness, brother shares that he injured himself having to assist the patient  - Patient was able to independently transfer to wheelchair prior to a couple of weeks ago and able to perform ADLs independently but still relied on his brother to provide all his meals for him  Today's Discussion: - Discussed with patient's brother and daughter about how the patient has been doing prior to hospitalization, current admission, and next steps - Family shares that the patient is stubborn and will only do what he wants and does not listen to family's advise of changing his habits in order to improve his health -  Daughter shares that when he was last hospitalized and discharged to rehab, she spoke with him at length about his poor eating habits and lack of motivation to participate in recovery - Daughter and brother both shared that the patient left AMA from rehab and the patient's son picked him up and dropped him off at brother's house and has been there ever since - Updated family about overall poor outlook given advanced heart failure and ESRD - Shared with family that right now patient is requiring vasopressors in order to safely perform dialysis, which is not sustainable overall and would make him a poor outpatient dialysis candidate - Discussed best case scenario and worst case scenario with family - Best case scenario  would be if patient becomes motivated to participate in rehab and changes his habits and participate in his care to get stronger and he may be able to return home at some point - Worst case scenario presented was that if patient continues current path, his heart may eventually fail requiring CPR/intubation and that would most likely lead to a long term ventilator need as well as need for skilled nursing facility - Discussed code status with family and shared with them that the likelihood of ROSC is low given severe heart failure and co-morbid conditions and that it would not be the recommendation of the medical team for the patient to remain full code and the option to be to allow the patient to pass peacefully and allow nature to take its course, family would like more time to discuss among themselves before making a decision  Review of Systems  Unable to perform ROS   Objective:   Primary Diagnoses: Present on Admission:  Multifocal pneumonia  Below-knee amputation of right lower extremity (HCC)  Chronic systolic CHF (congestive heart failure) (HCC)  Type 2 diabetes mellitus with diabetic chronic kidney disease (HCC)  Severe sepsis with septic shock  MRSA bacteremia   Hypotension  CAD (coronary artery disease)  Peripheral vascular disease, unspecified   Vital Signs:  BP (!) 119/55   Pulse 83   Temp 97.8 F (36.6 C)   Resp (!) 23   Ht 6' 1 (1.854 m)   Wt 115.6 kg   SpO2 99%   BMI 33.62 kg/m   Physical Exam Constitutional:      Appearance: He is ill-appearing.     Comments: Somnolent but awakes to voice.   HENT:     Head: Normocephalic and atraumatic.  Eyes:     Extraocular Movements: Extraocular movements intact.  Cardiovascular:     Rate and Rhythm: Normal rate.  Pulmonary:     Effort: Pulmonary effort is normal.  Musculoskeletal:     Left lower leg: Edema present.  Skin:    General: Skin is warm.  Neurological:     General: No focal deficit present.  Psychiatric:     Comments: Flat affect    Palliative Assessment/Data: 60%   Thank you for allowing us  to participate in the care of MERVYN PFLAUM Sr. PMT will continue to support holistically.  I personally spent a total of 50 minutes in the care of the patient today including preparing to see the patient, getting/reviewing separately obtained history, performing a medically appropriate exam/evaluation, counseling and educating, referring and communicating with other health care professionals, and documenting clinical information in the EHR.   Signed by: Fairy FORBES Shan DEVONNA Palliative Medicine Team  Team Phone # [phone] (Nights/Weekends)  11/11/2024, 12:40 PM   "

## 2024-11-11 NOTE — Progress Notes (Signed)
 "  NAME:  [name] Cabello., MRN:  [id number], DOB:  1961-09-09, LOS: 18 ADMISSION DATE:  10/24/2024, CONSULTATION DATE:  12/17 REFERRING MD:  Ricky - [facility], CHIEF COMPLAINT:  hypotension  History of Present Illness:  [name] HENRICHS Sr. is a 63 y.o. male who has a PMH as outlined below including but not limited to ESRD on HD MWF, A-fib, systolic CHF, hypertension, LBBB, DM.  He presented to [facility] ED on 10/24/2024 with dyspnea x roughly 2-3 weeks. He had been seen in the ED a few days early on 12/2 for dysuria and was started on a course of Cipro  which she started the following day and stated that he had been taking as prescribed.     CT chest that demonstrated multifocal pneumonia and small effusions.  Blood cultures ultimately grew MRSA.  He had been on vancomycin  which was continued. He had a left thora 12/10 with 450cc fluid removed.  His HD catheter was removed on 10/28/2024 as well as an arterial line.  He did have a TTE on 12/11 that showed an EF of 20-25%, G2 DD, mild decrease in RV SF, severe AAS, no vegetations.  TEE was recommended but had been deferred due to patient requiring vasopressor support.  ID was consulted and recommended line holiday.  He had a temporary femoral HD cath placed on 12/15.  ID recommendations included 6 weeks of vancomycin  to end on 12/09/2024.    12/17, he was transferred to [facility] for ongoing management for septic shock as well as for IR evaluation for tunneled HD catheter placement  Pertinent  Medical History  ESRD on HD MWF, Afib, HFrEF, T2DM, LBBB, severe PVD s/p R BKA Past Medical History:  Diagnosis Date   ESRD on hemodialysis (HCC)    a. HD MWF > 10 years.   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 01/2022 Echo: EF 45-50%, glob HK, mod LVH, mildly reduced RV fxn, mild MR, mod-sev MV Ca2+. Mild AS; b. 10/2023 Echo: EF 45-50%, glob HK, GrII DD, mildly reduced RV fxn, RVSP 52.86mmHg. Mild MR/MS, mod TR.   Hypertension    Nonischemic  cardiomyopathy (HCC)    a. 2019 Cath [facility]): Nonobstructive disease; b. 12/2019 Echo: EF 50%; c. 12/2019 MV (Tx w/u [facility]): Ant infarct w/ peri-infarct ischemia. EF 35%; c. 05/2021 MV (Tx w/u [facility]): Cor Ca2+, EF 53%, motion artifact, equiv study, likely low risk.   Nonobstructive coronary artery disease    a.  2019 Cath ([facility]): Left main normal, LAD 30 proximal, RCA 30 distal, EF 25%.   Obesity    Peripheral arterial disease    Peripheral vascular disease    Type 2 diabetes mellitus (HCC)    a. 10/2023 - admission w/ HHS.   Significant Hospital Events: Including procedures, antibiotic start and stop dates in addition to other pertinent events   12/8 admit 12/10 L thora with 450cc removed, transudate, neg cultures 12/11 echo 12/12 HD cath and art line removed 12/15 new femoral HD cath placed 12/17 transfer to [facility] 68M 12/19 HD  12/20 CRRT 12/23 Weaned off levophed  this am. HD trial this am. Transferred to [facility] 12/25 Returned to ICU for hypotension  Interim History / Subjective:  Returned to ICU overnight for hypotension on 12/25. Patient has chronic hypotension on midodrine . Mental status unchanged and intact  TEE cancelled due to vasopressor requirement   Remains on levophed  5 today. Dialysis today  Objective    Blood  pressure (!) 87/32, pulse 84, temperature 97.8 F (36.6 C), temperature source Oral, resp. rate (!) 22, height 6' 1 (1.854 m), weight 115.6 kg, SpO2 97%.        Intake/Output Summary (Last 24 hours) at 11/11/2024 0859 Last data filed at 11/11/2024 0600 Gross per 24 hour  Intake 513.26 ml  Output --  Net 513.26 ml   Filed Weights   11/08/24 1245 11/09/24 0500 11/11/24 0500  Weight: 110.4 kg 112.2 kg 115.6 kg   Physical Exam: General: Well-appearing, no acute distress HENT: [place], AT, OP clear, MMM Eyes: EOMI, no scleral icterus Respiratory: Clear to auscultation bilaterally.  No crackles, wheezing or rales Cardiovascular: RRR, -M/R/G,  no JVD GI: BS+, soft, nontender Extremities: Right BKA, L AVF, anasarca Neuro: AAO x4, CNII-XII grossly intact Psych: Normal mood, normal affect   Imaging, labs and test in EMR in the last 24 hours reviewed independently by me. Pertinent findings below:  CXR 12/18 retrocardiac infiltrate, small left pleural effusion CXR 11/06/24 bilateral infiltrates Na decreased 129 due to hypervolemia again CO2 22 Bcx 12/18 NGTD Final  Resolved problem list   Assessment and Plan   Acute hypoxic respiratory failure 2/2 multifocal pneumonia - resolved Left pleural transudative effusion s/p thoracentesis 12/10 -On room air -PRN Duonebs  HFrEF 20-25% Severe Aortic stenosis 12/11 TTE EF 20-25%, G2DD, mild decrease RVF; severe AS, no vegetation. Seen by outpatient cardiology 12/2023 and inpatient in 11/2023, noted severe AS, plan is to be managed medically given multiple comorbidities. Did not tolerate DAPT in past so due to bleeding, remains just on ASA.  -GDMT limited in setting of ESRD and pressor need -Volume removal per Nephrology -Continue outpatient cardiology f/u  Septic shock 2/2 MRSA bacteremia, pneumonia - resolved shock. Line holiday with new L femoral HD cath 12/15. S/p ceftriaxone  x 5d. Bcx 12/18 NGTD -Off pressors 12/23. Resumed on 12/24.  Chronic hypotension At this point shock does not appear to be infection (WBC normal, afebrile) related and may be due to poor perfusion in setting of severe heart failure, chronic hypotension and ESRD. Additional fluids is now contributing to hypervolemia and patient may be starting to develop cardiogenic shock due to this Plan: -Remains on levophed . Wean for MAP goal >60. Discussed with Nephrology and fluids administration should be minimized when patient asymptomatic in the setting of ESRD and heart failure -HD today with levophed  support. If ongoing pressor needs with dialysis, Nephrology will need to discuss with patient long term goals as not  feasible to continue as outpatient -Midodrine  20 mg TID. Consider reducing in setting of heart failure however patient aware of adverse effects and wishes to continue this medication. -Vanc x 6 weeks (End date: 12/09/24) -ID requesting to pursue TEE. Procedure cancelled due to current vasopressor requirements -Once off pressors and discharged from ICU, will need to consult IR for tunneled cath -ID f/u at [facility] on 12/26/24  -Palliative following regarding GOC with chronic hypotension related to co-morbidities   T2DM A1c 8.6 this month -Increase lantus  8>12U -Add mealtime coverage -SSI   ESRD on HD MWF Secondary hyperparathyroidism Per most recent fistulogram, he has an occluded subclavian vein on the right but patent RIJ vein from May, 2025.  -Has temp L femoral HD catheter.  -Nephrology following. Discussed with Nephrology and fluids administration should be minimized when patient asymptomatic in the setting of ESRD and heart failure -IR has requested patient to be out of ICU and off pressors before pursuing tunneled HD cath. Will need to reconsult  at that time -Continue cinacalcet  and sevelamer    Chronic Hyponatremia - worsening. Likely due to hypervolemia -Daily BMET -HD per schedule  Microcytic anemia Anemia of chronic kidney disease Hgb steady. No signs of bleeding. Trend CBC.   Anticoagulation: heparin  SQ  Reflux Home med PPI BID however patient not taking Continue PPI daily in setting of shock  Hx of PVD: No signs of infection at R BKA. On home ASA.   Critical care time:     The patient is critically ill with multiple organ systems failure and requires high complexity decision making for assessment and support, frequent evaluation and titration of therapies, application of advanced monitoring technologies and extensive interpretation of multiple databases.  Independent Critical Care Time: 35 Minutes.   Slater Staff, M.D. [facility] Pulmonary/Critical Care  Medicine 11/11/2024 8:59 AM   Please see Amion for pager number to reach on-call Pulmonary and Critical Care Team.   "

## 2024-11-12 DIAGNOSIS — I739 Peripheral vascular disease, unspecified: Secondary | ICD-10-CM | POA: Diagnosis not present

## 2024-11-12 DIAGNOSIS — E119 Type 2 diabetes mellitus without complications: Secondary | ICD-10-CM | POA: Diagnosis not present

## 2024-11-12 DIAGNOSIS — I132 Hypertensive heart and chronic kidney disease with heart failure and with stage 5 chronic kidney disease, or end stage renal disease: Secondary | ICD-10-CM | POA: Diagnosis not present

## 2024-11-12 DIAGNOSIS — E871 Hypo-osmolality and hyponatremia: Secondary | ICD-10-CM | POA: Diagnosis not present

## 2024-11-12 DIAGNOSIS — I9589 Other hypotension: Secondary | ICD-10-CM | POA: Diagnosis not present

## 2024-11-12 DIAGNOSIS — N186 End stage renal disease: Secondary | ICD-10-CM | POA: Diagnosis not present

## 2024-11-12 DIAGNOSIS — I5020 Unspecified systolic (congestive) heart failure: Secondary | ICD-10-CM

## 2024-11-12 DIAGNOSIS — R6521 Severe sepsis with septic shock: Secondary | ICD-10-CM | POA: Diagnosis not present

## 2024-11-12 DIAGNOSIS — D649 Anemia, unspecified: Secondary | ICD-10-CM

## 2024-11-12 DIAGNOSIS — J188 Other pneumonia, unspecified organism: Secondary | ICD-10-CM | POA: Diagnosis not present

## 2024-11-12 DIAGNOSIS — J9601 Acute respiratory failure with hypoxia: Secondary | ICD-10-CM | POA: Diagnosis not present

## 2024-11-12 DIAGNOSIS — A4102 Sepsis due to Methicillin resistant Staphylococcus aureus: Secondary | ICD-10-CM | POA: Diagnosis not present

## 2024-11-12 LAB — RENAL FUNCTION PANEL
Albumin: 3 g/dL — ABNORMAL LOW (ref 3.5–5.0)
Anion gap: 12 (ref 5–15)
BUN: 26 mg/dL — ABNORMAL HIGH (ref 8–23)
CO2: 22 mmol/L (ref 22–32)
Calcium: 7.4 mg/dL — ABNORMAL LOW (ref 8.9–10.3)
Chloride: 92 mmol/L — ABNORMAL LOW (ref 98–111)
Creatinine, Ser: 4.72 mg/dL — ABNORMAL HIGH (ref 0.61–1.24)
GFR, Estimated: 13 mL/min — ABNORMAL LOW
Glucose, Bld: 208 mg/dL — ABNORMAL HIGH (ref 70–99)
Phosphorus: 2.6 mg/dL (ref 2.5–4.6)
Potassium: 4.4 mmol/L (ref 3.5–5.1)
Sodium: 126 mmol/L — ABNORMAL LOW (ref 135–145)

## 2024-11-12 LAB — CBC
HCT: 31.4 % — ABNORMAL LOW (ref 39.0–52.0)
Hemoglobin: 10.5 g/dL — ABNORMAL LOW (ref 13.0–17.0)
MCH: 25.7 pg — ABNORMAL LOW (ref 26.0–34.0)
MCHC: 33.4 g/dL (ref 30.0–36.0)
MCV: 77 fL — ABNORMAL LOW (ref 80.0–100.0)
Platelets: 198 K/uL (ref 150–400)
RBC: 4.08 MIL/uL — ABNORMAL LOW (ref 4.22–5.81)
RDW: 24.6 % — ABNORMAL HIGH (ref 11.5–15.5)
WBC: 8.1 K/uL (ref 4.0–10.5)
nRBC: 1.7 % — ABNORMAL HIGH (ref 0.0–0.2)

## 2024-11-12 LAB — GLUCOSE, CAPILLARY
Glucose-Capillary: 194 mg/dL — ABNORMAL HIGH (ref 70–99)
Glucose-Capillary: 240 mg/dL — ABNORMAL HIGH (ref 70–99)
Glucose-Capillary: 215 mg/dL — ABNORMAL HIGH (ref 70–99)
Glucose-Capillary: 239 mg/dL — ABNORMAL HIGH (ref 70–99)
Glucose-Capillary: 213 mg/dL — ABNORMAL HIGH (ref 70–99)

## 2024-11-12 LAB — APTT: aPTT: 45 s — ABNORMAL HIGH (ref 24–36)

## 2024-11-12 LAB — MAGNESIUM: Magnesium: 1.9 mg/dL (ref 1.7–2.4)

## 2024-11-12 MED ORDER — FLUDROCORTISONE ACETATE 0.1 MG PO TABS
0.1000 mg | ORAL_TABLET | Freq: Every day | ORAL | Status: DC
  Administered 2024-11-12 – 2024-11-17 (×6): 0.1 mg via ORAL
  Filled 2024-11-12 (×3): qty 1

## 2024-11-12 MED ORDER — BENZONATATE 100 MG PO CAPS
200.0000 mg | ORAL_CAPSULE | Freq: Three times a day (TID) | ORAL | Status: AC
  Administered 2024-11-12 – 2024-11-15 (×9): 200 mg via ORAL
  Filled 2024-11-12 (×5): qty 2

## 2024-11-12 NOTE — Plan of Care (Signed)
   Problem: Activity: Goal: Risk for activity intolerance will decrease Outcome: Progressing   Problem: Nutrition: Goal: Adequate nutrition will be maintained Outcome: Progressing   Problem: Elimination: Goal: Will not experience complications related to bowel motility Outcome: Progressing

## 2024-11-12 NOTE — Progress Notes (Signed)
 "  NAME:  [name] Racca., MRN:  [id number], DOB:  07-14-1961, LOS: 19 ADMISSION DATE:  10/24/2024, CONSULTATION DATE:  12/17 REFERRING MD:  Ricky - [facility], CHIEF COMPLAINT:  hypotension  History of Present Illness:  [name] BRUNSMAN Sr. is a 63 y.o. male who has a PMH as outlined below including but not limited to ESRD on HD MWF, A-fib, systolic CHF, hypertension, LBBB, DM.  He presented to [facility] ED on 10/24/2024 with dyspnea x roughly 2-3 weeks. He had been seen in the ED a few days early on 12/2 for dysuria and was started on a course of Cipro  which she started the following day and stated that he had been taking as prescribed.     CT chest that demonstrated multifocal pneumonia and small effusions.  Blood cultures ultimately grew MRSA.  He had been on vancomycin  which was continued. He had a left thora 12/10 with 450cc fluid removed.  His HD catheter was removed on 10/28/2024 as well as an arterial line.  He did have a TTE on 12/11 that showed an EF of 20-25%, G2 DD, mild decrease in RV SF, severe AAS, no vegetations.  TEE was recommended but had been deferred due to patient requiring vasopressor support.  ID was consulted and recommended line holiday.  He had a temporary femoral HD cath placed on 12/15.  ID recommendations included 6 weeks of vancomycin  to end on 12/09/2024.    12/17, he was transferred to [facility] for ongoing management for septic shock as well as for IR evaluation for tunneled HD catheter placement  Pertinent  Medical History  ESRD on HD MWF, Afib, HFrEF, T2DM, LBBB, severe PVD s/p R BKA Past Medical History:  Diagnosis Date   ESRD on hemodialysis (HCC)    a. HD MWF > 10 years.   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 01/2022 Echo: EF 45-50%, glob HK, mod LVH, mildly reduced RV fxn, mild MR, mod-sev MV Ca2+. Mild AS; b. 10/2023 Echo: EF 45-50%, glob HK, GrII DD, mildly reduced RV fxn, RVSP 52.21mmHg. Mild MR/MS, mod TR.   Hypertension    Nonischemic  cardiomyopathy (HCC)    a. 2019 Cath [facility]): Nonobstructive disease; b. 12/2019 Echo: EF 50%; c. 12/2019 MV (Tx w/u [facility]): Ant infarct w/ peri-infarct ischemia. EF 35%; c. 05/2021 MV (Tx w/u [facility]): Cor Ca2+, EF 53%, motion artifact, equiv study, likely low risk.   Nonobstructive coronary artery disease    a.  2019 Cath ([facility]): Left main normal, LAD 30 proximal, RCA 30 distal, EF 25%.   Obesity    Peripheral arterial disease    Peripheral vascular disease    Type 2 diabetes mellitus (HCC)    a. 10/2023 - admission w/ HHS.   Significant Hospital Events: Including procedures, antibiotic start and stop dates in addition to other pertinent events   12/8 admit 12/10 L thora with 450cc removed, transudate, neg cultures 12/11 echo 12/12 HD cath and art line removed 12/15 new femoral HD cath placed 12/17 transfer to [facility] 74M 12/19 HD  12/20 CRRT 12/23 Weaned off levophed  this am. HD trial this am. Transferred to [facility] 12/25 Returned to ICU for hypotension  Interim History / Subjective:  No overnight events Post 1 unit PRBC On peripheral levo Remains on midodrine  He is awake alert interactive  Objective    Blood pressure (!) 108/53, pulse 78, temperature 97.9 F (36.6 C), temperature source Oral, resp. rate 17, height  6' 1 (1.854 m), weight 118 kg, SpO2 96%.        Intake/Output Summary (Last 24 hours) at 11/12/2024 0920 Last data filed at 11/12/2024 0800 Gross per 24 hour  Intake 1105.14 ml  Output 500 ml  Net 605.14 ml   Filed Weights   11/11/24 0830 11/11/24 1300 11/12/24 0400  Weight: 115.6 kg 115.1 kg 118 kg   Physical Exam: General: Middle-age, does not appear to be in distress HENT: Moist oral mucosa Eyes: Anicteric Respiratory: Clear breath sounds to auscultation bilaterally Cardiovascular: S1-S2 appreciated GI: Soft, bowel sounds appreciated Extremities: Right below-knee amputation, 2+ pitting edema left lower extremity Neuro: Alert and  oriented x 3 Psych: Normal affect  I reviewed last 24 h vitals and pain scores, last 48 h intake and output, last 24 h labs and trends, and last 24 h imaging results. -Blood culture 12/18-no growth -On renal dose vancomycin   Resolved problem list   Assessment and Plan   Acute hypoxemic respiratory failure Multifocal pneumonia - On room air at present - As needed DuoNebs  Heart failure with reduced ejection fraction 20-25% Severe aortic stenosis-plan is medical management per cardiology - GDMT limited in the setting of ESRD and pressor requirement - Continue volume removal per nephrology - For outpatient cardiology follow-up  Septic shock secondary to MRSA bacteremia/pneumonia - Left femoral HD cath on 1215 - Received 5 days of ceftriaxone  - Was weaned off pressors previously but resumed again 12/24 - Plan is for vancomycin  for 6 weeks with end date of 12/09/2024 - Request for TEE-deferred due to acute illness  Chronic hypotension - On midodrine  20 3 times daily - Will add fludrocortisone  0.1  End-stage renal disease -On HD Monday Wednesday Friday - Dialysis planned for 12/29 - Tolerated on 12/26 - IR will plan tunneled catheter once patient out of ICU - IR will need reconsulted at the time - Continue Cinacalcet  and sevelamer   Chronic hyponatremia - Continue to monitor - Trend daily  Type 2 diabetes - Continue Lantus  -Continue SSI -No coverage insulin   Chronic anemia Anemia of chronic disease - Continue to trend  Peripheral vascular disease - Continue aspirin   On subcutaneous heparin  for DVT prophylaxis  Continue midodrine  Continue Levophed  Add fludrocortisone   Needs goals of care discussions if unable to wean off Levophed   The patient is critically ill with multiple organ systems failure and requires high complexity decision making for assessment and support, frequent evaluation and titration of therapies, application of advanced monitoring technologies  and extensive interpretation of multiple databases. Critical Care Time devoted to patient care services described in this note independent of APP/resident time (if applicable)  is 40 minutes.   Jennet Epley MD [place] Pulmonary Critical Care Personal pager: See Amion If unanswered, please page CCM On-call: #[phone] "

## 2024-11-12 NOTE — Progress Notes (Signed)
 [name] NEPHROLOGY PROGRESS NOTE  Assessment/ Plan: Pt is a 63 y.o. yo male   Dialysis Orders: MWF [facility] 4h  B400  101.9kg  2K bath  TDC  Hep 6000 - Hectoral 5mcg IV q HD - No ESA/iron   # ESRD: Usual MWF. HD here 12/19. CRRT started for vol overload w/ shock on 12/20-12/22. The dialysis has been challenging because of persistent hypotension.  BP remains low with ongoing need of Levophed .  May not be a candidate for outpatient dialysis if he has ongoing need of Levophed .  Noted palliative care team is following. Plan for next HD on Monday.  # Septic/cardiogenic shock due to MRSA bacteremia: Back on Levophed .  TDC was removed, status post temporary HD line.IR consulted for new line - plan is to wait until off pressors and out of ICU.  # Hypotension/shock: On midodrine  20 mg 3 times daily, back on Levophed .  Adding fludrocortisone  by ICU team.  # Acute hypoxic respiratory failure due to fluid overload, acute systolic CHF and pneumonia: UF with HD, on antibiotics per PCCM.  #Hyponatremia: hypervolemic due to vol overload.  Unable to ultrafiltrate much because of persistent hypotension.  Please avoid IV fluid.  # Anemia of ESRD: Received PRBC, iron  saturation 8%, high ferritin due to inflammation, I ordered IV iron  and started erythropoietin.  #Secondary HPTH: CorrCa/Phos ok - continue home meds.  On Cinacalcet  and sevelamer .  ** Holiday Schedule for THIS week** Usual MWF schedule -> Sun (21), Tues (23), Friday (26) Usual TTS schedule -> Mon (22), Wed (24), Sat (27)  Subjective: Seen and examined in ICU.  Tolerated dialysis well with pulmonary cc UF yesterday.  Current blood pressure acceptable on small dose of Levophed  with plan to taper off.  No other new event.  Objective Vital signs in last 24 hours: Vitals:   11/11/24 2357 11/12/24 0320 11/12/24 0400 11/12/24 0746  BP:      Pulse:      Resp:      Temp: 98.4 F (36.9 C) 98 F (36.7 C)  97.9 F (36.6 C)   TempSrc: Oral Oral  Oral  SpO2:      Weight:   118 kg   Height:       Weight change: 0 kg  Intake/Output Summary (Last 24 hours) at 11/12/2024 1009 Last data filed at 11/12/2024 0800 Gross per 24 hour  Intake 1082.64 ml  Output 500 ml  Net 582.64 ml       Labs: RENAL PANEL Recent Labs  Lab 11/08/24 0436 11/09/24 0606 11/10/24 0625 11/11/24 0329 11/11/24 0330 11/12/24 0400  NA 128* 130* 128* 129*  --  126*  K 4.5 4.0 4.5 4.4  --  4.4  CL 97* 97* 95* 95*  --  92*  CO2 21* 22 18* 22  --  22  GLUCOSE 217* 133* 234* 69*  --  208*  BUN 22 19 24* 29*  --  26*  CREATININE 4.15* 3.85* 4.75* 5.56*  --  4.72*  CALCIUM  7.7* 7.8* 7.7* 7.5*  --  7.4*  MG 2.1 2.0 2.1  --  2.0 1.9  PHOS 2.9 2.6 3.2 3.5  --  2.6  ALBUMIN  2.7* 3.0* 3.1* 3.0*  --  3.0*    Liver Function Tests: Recent Labs  Lab 11/10/24 0625 11/11/24 0329 11/12/24 0400  ALBUMIN  3.1* 3.0* 3.0*   No results for input(s): LIPASE, AMYLASE in the last 168 hours. No results for input(s): AMMONIA in the last 168 hours.  CBC: Recent Labs    11/16/23 1518 11/17/23 0245 11/24/23 1306 11/25/23 1108 11/30/23 1351 12/01/23 0246 11/06/24 0248 11/07/24 0532 11/09/24 0607 11/09/24 1036 11/10/24 0625 11/12/24 0400  HGB  --    < >  --    < >  --    < > 11.3* 10.5* 9.8*  --  10.9* 10.5*  MCV  --    < >  --    < >  --    < > 75.6* 77.0* 78.3*  --  78.7* 77.0*  VITAMINB12 2,058*  --   --   --   --   --   --   --   --  1,949*  --   --   FOLATE 16.6  --   --   --   --   --   --   --   --  12.7  --   --   FERRITIN  --   --  421*  --  342*  --   --   --   --  162  --   --   TIBC  --   --  294  --  322  --   --   --   --  232*  --   --   IRON   --   --  46  --  30*  --   --   --   --  18*  --   --   RETICCTPCT  --   --   --   --   --   --   --   --   --  2.2  --   --    < > = values in this interval not displayed.    Cardiac Enzymes: No results for input(s): CKTOTAL, CKMB, CKMBINDEX, TROPONINI in the last  168 hours. CBG: Recent Labs  Lab 11/11/24 0738 11/11/24 1116 11/11/24 1513 11/11/24 2106 11/12/24 0745  GLUCAP 73 125* 143* 150* 194*    Iron  Studies:  Recent Labs    11/09/24 1036  IRON  18*  TIBC 232*  FERRITIN 162   Studies/Results: No results found.   Medications: Infusions:  anticoagulant sodium citrate      norepinephrine  (LEVOPHED ) Adult infusion 1 mcg/min (11/11/24 1900)    Scheduled Medications:  aspirin  EC  81 mg Oral Q breakfast   Chlorhexidine  Gluconate Cloth  6 each Topical Q0600   cinacalcet   60 mg Oral Q breakfast   darbepoetin (ARANESP ) injection - DIALYSIS  60 mcg Subcutaneous Q Thu-1800   feeding supplement  237 mL Oral BID BM   fludrocortisone   0.1 mg Oral Daily   guaiFENesin   600 mg Oral BID   heparin   5,000 Units Subcutaneous Q8H   insulin  aspart  0-15 Units Subcutaneous TID WC   insulin  aspart  2 Units Subcutaneous TID WC   insulin  glargine  8 Units Subcutaneous Daily   midodrine   20 mg Oral Q8H   multivitamin  1 tablet Oral QHS   pantoprazole   40 mg Oral Daily   sevelamer  carbonate  3,200 mg Oral TID WC   vancomycin  variable dose per unstable renal function (pharmacist dosing)   Does not apply See admin instructions    have reviewed scheduled and prn medications.  Physical Exam: General:NAD, comfortable, somnolent Heart:RRR, s1s2 nl Lungs:clear b/l, no crackle Abdomen:soft, Non-tender, non-distended Extremities: Bilateral upper and lower extremities edema dependent edema Dialysis Access: Left femoral temporary catheter.  [name] [name] [name] [name] 11/12/2024,10:09 AM  LOS: 19 days

## 2024-11-12 NOTE — Plan of Care (Signed)

## 2024-11-13 DIAGNOSIS — E871 Hypo-osmolality and hyponatremia: Secondary | ICD-10-CM | POA: Diagnosis not present

## 2024-11-13 DIAGNOSIS — I739 Peripheral vascular disease, unspecified: Secondary | ICD-10-CM | POA: Diagnosis not present

## 2024-11-13 DIAGNOSIS — E119 Type 2 diabetes mellitus without complications: Secondary | ICD-10-CM | POA: Diagnosis not present

## 2024-11-13 DIAGNOSIS — J188 Other pneumonia, unspecified organism: Secondary | ICD-10-CM | POA: Diagnosis not present

## 2024-11-13 DIAGNOSIS — R6521 Severe sepsis with septic shock: Secondary | ICD-10-CM | POA: Diagnosis not present

## 2024-11-13 DIAGNOSIS — D649 Anemia, unspecified: Secondary | ICD-10-CM | POA: Diagnosis not present

## 2024-11-13 DIAGNOSIS — I9589 Other hypotension: Secondary | ICD-10-CM | POA: Diagnosis not present

## 2024-11-13 DIAGNOSIS — A4102 Sepsis due to Methicillin resistant Staphylococcus aureus: Secondary | ICD-10-CM | POA: Diagnosis not present

## 2024-11-13 DIAGNOSIS — I132 Hypertensive heart and chronic kidney disease with heart failure and with stage 5 chronic kidney disease, or end stage renal disease: Secondary | ICD-10-CM | POA: Diagnosis not present

## 2024-11-13 DIAGNOSIS — J9601 Acute respiratory failure with hypoxia: Secondary | ICD-10-CM | POA: Diagnosis not present

## 2024-11-13 DIAGNOSIS — I5020 Unspecified systolic (congestive) heart failure: Secondary | ICD-10-CM | POA: Diagnosis not present

## 2024-11-13 DIAGNOSIS — N186 End stage renal disease: Secondary | ICD-10-CM | POA: Diagnosis not present

## 2024-11-13 LAB — GLUCOSE, CAPILLARY
Glucose-Capillary: 146 mg/dL — ABNORMAL HIGH (ref 70–99)
Glucose-Capillary: 173 mg/dL — ABNORMAL HIGH (ref 70–99)
Glucose-Capillary: 178 mg/dL — ABNORMAL HIGH (ref 70–99)
Glucose-Capillary: 107 mg/dL — ABNORMAL HIGH (ref 70–99)
Glucose-Capillary: 122 mg/dL — ABNORMAL HIGH (ref 70–99)

## 2024-11-13 LAB — CBC
HCT: 31.5 % — ABNORMAL LOW (ref 39.0–52.0)
Hemoglobin: 10.3 g/dL — ABNORMAL LOW (ref 13.0–17.0)
MCH: 25.3 pg — ABNORMAL LOW (ref 26.0–34.0)
MCHC: 32.7 g/dL (ref 30.0–36.0)
MCV: 77.4 fL — ABNORMAL LOW (ref 80.0–100.0)
Platelets: 212 K/uL (ref 150–400)
RBC: 4.07 MIL/uL — ABNORMAL LOW (ref 4.22–5.81)
RDW: 24.4 % — ABNORMAL HIGH (ref 11.5–15.5)
WBC: 11.2 K/uL — ABNORMAL HIGH (ref 4.0–10.5)
nRBC: 0.7 % — ABNORMAL HIGH (ref 0.0–0.2)

## 2024-11-13 LAB — RENAL FUNCTION PANEL
Albumin: 3 g/dL — ABNORMAL LOW (ref 3.5–5.0)
Anion gap: 11 (ref 5–15)
BUN: 34 mg/dL — ABNORMAL HIGH (ref 8–23)
CO2: 22 mmol/L (ref 22–32)
Calcium: 7.4 mg/dL — ABNORMAL LOW (ref 8.9–10.3)
Chloride: 92 mmol/L — ABNORMAL LOW (ref 98–111)
Creatinine, Ser: 5.51 mg/dL — ABNORMAL HIGH (ref 0.61–1.24)
GFR, Estimated: 11 mL/min — ABNORMAL LOW
Glucose, Bld: 179 mg/dL — ABNORMAL HIGH (ref 70–99)
Phosphorus: 2.8 mg/dL (ref 2.5–4.6)
Potassium: 4.5 mmol/L (ref 3.5–5.1)
Sodium: 126 mmol/L — ABNORMAL LOW (ref 135–145)

## 2024-11-13 LAB — APTT: aPTT: 45 s — ABNORMAL HIGH (ref 24–36)

## 2024-11-13 LAB — MAGNESIUM: Magnesium: 2 mg/dL (ref 1.7–2.4)

## 2024-11-13 MED ORDER — INSULIN ASPART 100 UNIT/ML IJ SOLN
4.0000 [IU] | Freq: Three times a day (TID) | INTRAMUSCULAR | Status: DC
  Administered 2024-11-13 – 2024-11-16 (×8): 4 [IU] via SUBCUTANEOUS
  Filled 2024-11-13 (×2): qty 4

## 2024-11-13 MED ORDER — CHLORHEXIDINE GLUCONATE CLOTH 2 % EX PADS
6.0000 | MEDICATED_PAD | Freq: Every day | CUTANEOUS | Status: DC
  Administered 2024-11-14 – 2024-11-16 (×3): 6 via TOPICAL

## 2024-11-13 NOTE — Plan of Care (Signed)

## 2024-11-13 NOTE — Progress Notes (Signed)
 "  NAME:  [name] [name]., MRN:  [id number], DOB:  01-06-1961, LOS: 20 ADMISSION DATE:  10/24/2024, CONSULTATION DATE:  12/17 REFERRING MD:  Ricky - [facility], CHIEF COMPLAINT:  hypotension  History of Present Illness:  [name] [name] Sr. is a 63 y.o. male who has a PMH as outlined below including but not limited to ESRD on HD MWF, A-fib, systolic CHF, hypertension, LBBB, DM.  He presented to [facility] ED on 10/24/2024 with dyspnea x roughly 2-3 weeks. He had been seen in the ED a few days early on 12/2 for dysuria and was started on a course of Cipro  which she started the following day and stated that he had been taking as prescribed.     CT chest that demonstrated multifocal pneumonia and small effusions.  Blood cultures ultimately grew MRSA.  He had been on vancomycin  which was continued. He had a left thora 12/10 with 450cc fluid removed.  His HD catheter was removed on 10/28/2024 as well as an arterial line.  He did have a TTE on 12/11 that showed an EF of 20-25%, G2 DD, mild decrease in RV SF, severe AAS, no vegetations.  TEE was recommended but had been deferred due to patient requiring vasopressor support.  ID was consulted and recommended line holiday.  He had a temporary femoral HD cath placed on 12/15.  ID recommendations included 6 weeks of vancomycin  to end on 12/09/2024.    12/17, he was transferred to [facility] for ongoing management for septic shock as well as for IR evaluation for tunneled HD catheter placement  Pertinent  Medical History  ESRD on HD MWF, Afib, HFrEF, T2DM, LBBB, severe PVD s/p R BKA Past Medical History:  Diagnosis Date   ESRD on hemodialysis (HCC)    a. HD MWF > 10 years.   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 01/2022 Echo: EF 45-50%, glob HK, mod LVH, mildly reduced RV fxn, mild MR, mod-sev MV Ca2+. Mild AS; b. 10/2023 Echo: EF 45-50%, glob HK, GrII DD, mildly reduced RV fxn, RVSP 52.51mmHg. Mild MR/MS, mod TR.   Hypertension    Nonischemic  cardiomyopathy (HCC)    a. 2019 Cath [facility]): Nonobstructive disease; b. 12/2019 Echo: EF 50%; c. 12/2019 MV (Tx w/u [facility]): Ant infarct w/ peri-infarct ischemia. EF 35%; c. 05/2021 MV (Tx w/u [facility]): Cor Ca2+, EF 53%, motion artifact, equiv study, likely low risk.   Nonobstructive coronary artery disease    a.  2019 Cath ([facility]): Left main normal, LAD 30 proximal, RCA 30 distal, EF 25%.   Obesity    Peripheral arterial disease    Peripheral vascular disease    Type 2 diabetes mellitus (HCC)    a. 10/2023 - admission w/ HHS.   Significant Hospital Events: Including procedures, antibiotic start and stop dates in addition to other pertinent events   12/8 admit 12/10 L thora with 450cc removed, transudate, neg cultures 12/11 echo 12/12 HD cath and art line removed 12/15 new femoral HD cath placed 12/17 [facility] to [facility] 63M 12/19 HD  12/20 CRRT 12/23 Weaned off levophed  this am. HD trial this am. Transferred to [facility] 12/25 Returned to ICU for hypotension 12/28-remains on Levophed   Interim History / Subjective:  No overnight events Intermittent cough On midodrine , remains on Levophed , fludrocortisone  added Awake and interactive  Objective    Blood pressure (!) 108/53, pulse 90, temperature 98.2 F (36.8 C), temperature source Oral, resp. rate (!) 21,  height 6' 1 (1.854 m), weight 119 kg, SpO2 97%.        Intake/Output Summary (Last 24 hours) at 11/13/2024 0931 Last data filed at 11/13/2024 0900 Gross per 24 hour  Intake 505.13 ml  Output --  Net 505.13 ml   Filed Weights   11/11/24 1300 11/12/24 0400 11/13/24 0311  Weight: 115.1 kg 118 kg 119 kg   Physical Exam: General: Middle-age, does not appear to be in distress  HENT: Moist oral mucosa Eyes: Anicteric Respiratory: Clear breath sounds, no added sounds Cardiovascular: S1-S2 appreciated, no murmur GI: Abdomen is soft, bowel sounds appreciated Extremities: Right below-knee amputation, 2+ pitting edema  in left lower extremity  neuro: He is awake alert and oriented x 3 Psych: His affect is normal  I reviewed last 24 h vitals and pain scores, last 48 h intake and output, last 24 h labs and trends, and last 24 h imaging results. - Blood culture 12/18 with no growth - On renal dose vancomycin   Resolved problem list   Assessment and Plan   Multifocal pneumonia Acute hypoxemic respiratory failure - Remains on room air - As needed bronchodilators-DuoNeb  Heart failure with reduced ejection fraction of 20 to 25% Severe aortic stenosis-continue medical management as per cardiology - GDMT limited in the setting of end-stage renal disease and pressor requirement - Continue volume removal per nephrology  Septic shock secondary to MRSA bacteremia/pneumonia - Left femoral HD cath placed on 12/15 - Did complete 5 days of ceftriaxone  - Pressors that to be reinitiated on 12/24 - Goal is for vancomycin  for 6 weeks with end date of 12/09/2024 - TEE has been requested but was deferred secondary to acute illness  Chronic hypotension - On midodrine  20, 3 times daily - Fludrocortisone  added at 0.1 - Remains on Levophed  at 2 mcg/min peripherally  End-stage renal disease on hemodialysis Monday Wednesday Friday, Next dialysis session planned for 12/29, tolerated dialysis on 12/26 -IR to have tunneled catheter placement once patient is more stable-IR will need to be consulted at that time -Continue Cinacalcet  and sevelamer   Chronic hyponatremia - Continue to trend  Type 2 diabetes - Continue SSI, Lantus   Peripheral vascular disease - Continue aspirin   Continue DVT prophylaxis  The patient is critically ill with multiple organ systems failure and requires high complexity decision making for assessment and support, frequent evaluation and titration of therapies, application of advanced monitoring technologies and extensive interpretation of multiple databases. Critical Care Time devoted to  patient care services described in this note independent of APP/resident time (if applicable)  is 35 minutes.   [name] Epley MD [facility] Personal pager: See Amion If unanswered, please page CCM On-call: [phone] "

## 2024-11-13 NOTE — Plan of Care (Signed)
   Problem: Activity: Goal: Risk for activity intolerance will decrease Outcome: Progressing   Problem: Nutrition: Goal: Adequate nutrition will be maintained Outcome: Progressing   Problem: Elimination: Goal: Will not experience complications related to bowel motility Outcome: Progressing

## 2024-11-13 NOTE — Progress Notes (Signed)
 [facility] NEPHROLOGY PROGRESS NOTE  Assessment/ Plan: Pt is a 63 y.o. yo male   Dialysis Orders: MWF [facility] 4h  B400  101.9kg  2K bath  TDC  Hep 6000 - Hectoral 5mcg IV q HD - No ESA/iron   # ESRD: Usual MWF. HD here 12/19. CRRT started for vol overload w/ shock on 12/20-12/22. The dialysis has been challenging because of persistent hypotension.  BP remains low with ongoing need of Levophed .  May not be a candidate for outpatient dialysis if he has ongoing need of Levophed .  Noted palliative care team is following. Plan for next HD on Monday.  # Septic/cardiogenic shock due to MRSA bacteremia: on Levophed .  TDC was removed, status post temporary HD line.IR consulted for new line - plan is to wait until off pressors and out of ICU.  # Hypotension/shock: On midodrine  20 mg 3 times daily, back on Levophed .  Added fludrocortisone  by ICU team.  # Acute hypoxic respiratory failure due to fluid overload, acute systolic CHF and pneumonia: UF with HD, on antibiotics per PCCM.  #Hyponatremia: hypervolemic due to vol overload.  Unable to ultrafiltrate much because of persistent hypotension.  Please avoid IV fluid.  # Anemia of ESRD: Received PRBC, iron  saturation 8%, high ferritin due to inflammation, I ordered IV iron  and started erythropoietin.  #Secondary HPTH: CorrCa/Phos ok - continue home meds.  On Cinacalcet  and sevelamer .  Subjective: Seen and examined in ICU.  Some coughing.  Requiring Levophed  around 2 mics.  No chest pain or shortness of breath.  Discussed with ICU team.  Objective Vital signs in last 24 hours: Vitals:   11/13/24 0815 11/13/24 0830 11/13/24 0845 11/13/24 0900  BP:      Pulse: 85 85 92 90  Resp:   (!) 22 (!) 21  Temp:      TempSrc:      SpO2: 100% 99% 99% 97%  Weight:      Height:       Weight change: 3.4 kg  Intake/Output Summary (Last 24 hours) at 11/13/2024 1052 Last data filed at 11/13/2024 0900 Gross per 24 hour  Intake 256.93 ml   Output --  Net 256.93 ml       Labs: RENAL PANEL Recent Labs  Lab 11/09/24 0606 11/10/24 0625 11/11/24 0329 11/11/24 0330 11/12/24 0400 11/13/24 0300  NA 130* 128* 129*  --  126* 126*  K 4.0 4.5 4.4  --  4.4 4.5  CL 97* 95* 95*  --  92* 92*  CO2 22 18* 22  --  22 22  GLUCOSE 133* 234* 69*  --  208* 179*  BUN 19 24* 29*  --  26* 34*  CREATININE 3.85* 4.75* 5.56*  --  4.72* 5.51*  CALCIUM  7.8* 7.7* 7.5*  --  7.4* 7.4*  MG 2.0 2.1  --  2.0 1.9 2.0  PHOS 2.6 3.2 3.5  --  2.6 2.8  ALBUMIN  3.0* 3.1* 3.0*  --  3.0* 3.0*    Liver Function Tests: Recent Labs  Lab 11/11/24 0329 11/12/24 0400 11/13/24 0300  ALBUMIN  3.0* 3.0* 3.0*   No results for input(s): LIPASE, AMYLASE in the last 168 hours. No results for input(s): AMMONIA in the last 168 hours. CBC: Recent Labs    11/16/23 1518 11/17/23 0245 11/24/23 1306 11/25/23 1108 11/30/23 1351 12/01/23 0246 11/07/24 0532 11/09/24 0607 11/09/24 1036 11/10/24 0625 11/12/24 0400 11/13/24 0300  HGB  --    < >  --    < >  --    < >  10.5* 9.8*  --  10.9* 10.5* 10.3*  MCV  --    < >  --    < >  --    < > 77.0* 78.3*  --  78.7* 77.0* 77.4*  VITAMINB12 2,058*  --   --   --   --   --   --   --  1,949*  --   --   --   FOLATE 16.6  --   --   --   --   --   --   --  12.7  --   --   --   FERRITIN  --   --  421*  --  342*  --   --   --  162  --   --   --   TIBC  --   --  294  --  322  --   --   --  232*  --   --   --   IRON   --   --  46  --  30*  --   --   --  18*  --   --   --   RETICCTPCT  --   --   --   --   --   --   --   --  2.2  --   --   --    < > = values in this interval not displayed.    Cardiac Enzymes: No results for input(s): CKTOTAL, CKMB, CKMBINDEX, TROPONINI in the last 168 hours. CBG: Recent Labs  Lab 11/12/24 1120 11/12/24 1238 11/12/24 1651 11/12/24 2101 11/13/24 0727  GLUCAP 240* 215* 239* 213* 146*    Iron  Studies:  No results for input(s): IRON , TIBC, TRANSFERRIN, FERRITIN  in the last 72 hours.  Studies/Results: No results found.   Medications: Infusions:  anticoagulant sodium citrate      norepinephrine  (LEVOPHED ) Adult infusion 2 mcg/min (11/13/24 0900)    Scheduled Medications:  aspirin  EC  81 mg Oral Q breakfast   benzonatate   200 mg Oral TID   Chlorhexidine  Gluconate Cloth  6 each Topical Q0600   cinacalcet   60 mg Oral Q breakfast   darbepoetin (ARANESP ) injection - DIALYSIS  60 mcg Subcutaneous Q Thu-1800   feeding supplement  237 mL Oral BID BM   fludrocortisone   0.1 mg Oral Daily   guaiFENesin   600 mg Oral BID   heparin   5,000 Units Subcutaneous Q8H   insulin  aspart  0-15 Units Subcutaneous TID WC   insulin  aspart  4 Units Subcutaneous TID WC   insulin  glargine  8 Units Subcutaneous Daily   midodrine   20 mg Oral Q8H   multivitamin  1 tablet Oral QHS   pantoprazole   40 mg Oral Daily   sevelamer  carbonate  3,200 mg Oral TID WC   vancomycin  variable dose per unstable renal function (pharmacist dosing)   Does not apply See admin instructions    have reviewed scheduled and prn medications.  Physical Exam: General:NAD, comfortable, somnolent Heart:RRR, s1s2 nl Lungs:clear b/l, no crackle Abdomen:soft, Non-tender, non-distended Extremities: Bilateral upper and lower extremities edema dependent edema Dialysis Access: Left femoral temporary catheter.  Jourden Gilson Prasad Glennie Rodda 11/13/2024,10:52 AM  LOS: 20 days

## 2024-11-14 DIAGNOSIS — Z515 Encounter for palliative care: Secondary | ICD-10-CM | POA: Diagnosis not present

## 2024-11-14 DIAGNOSIS — J188 Other pneumonia, unspecified organism: Secondary | ICD-10-CM | POA: Diagnosis not present

## 2024-11-14 DIAGNOSIS — N186 End stage renal disease: Secondary | ICD-10-CM | POA: Diagnosis not present

## 2024-11-14 DIAGNOSIS — I9589 Other hypotension: Secondary | ICD-10-CM | POA: Diagnosis not present

## 2024-11-14 DIAGNOSIS — E871 Hypo-osmolality and hyponatremia: Secondary | ICD-10-CM | POA: Diagnosis not present

## 2024-11-14 DIAGNOSIS — D649 Anemia, unspecified: Secondary | ICD-10-CM | POA: Diagnosis not present

## 2024-11-14 DIAGNOSIS — J9601 Acute respiratory failure with hypoxia: Secondary | ICD-10-CM | POA: Diagnosis not present

## 2024-11-14 DIAGNOSIS — A4102 Sepsis due to Methicillin resistant Staphylococcus aureus: Secondary | ICD-10-CM | POA: Diagnosis not present

## 2024-11-14 DIAGNOSIS — I5020 Unspecified systolic (congestive) heart failure: Secondary | ICD-10-CM | POA: Diagnosis not present

## 2024-11-14 DIAGNOSIS — I132 Hypertensive heart and chronic kidney disease with heart failure and with stage 5 chronic kidney disease, or end stage renal disease: Secondary | ICD-10-CM | POA: Diagnosis not present

## 2024-11-14 DIAGNOSIS — I739 Peripheral vascular disease, unspecified: Secondary | ICD-10-CM | POA: Diagnosis not present

## 2024-11-14 DIAGNOSIS — E119 Type 2 diabetes mellitus without complications: Secondary | ICD-10-CM | POA: Diagnosis not present

## 2024-11-14 DIAGNOSIS — Z7189 Other specified counseling: Secondary | ICD-10-CM | POA: Diagnosis not present

## 2024-11-14 DIAGNOSIS — R6521 Severe sepsis with septic shock: Secondary | ICD-10-CM | POA: Diagnosis not present

## 2024-11-14 LAB — CBC
HCT: 31.7 % — ABNORMAL LOW (ref 39.0–52.0)
Hemoglobin: 10.9 g/dL — ABNORMAL LOW (ref 13.0–17.0)
MCH: 26.3 pg (ref 26.0–34.0)
MCHC: 34.4 g/dL (ref 30.0–36.0)
MCV: 76.6 fL — ABNORMAL LOW (ref 80.0–100.0)
Platelets: 223 K/uL (ref 150–400)
RBC: 4.14 MIL/uL — ABNORMAL LOW (ref 4.22–5.81)
RDW: 24.8 % — ABNORMAL HIGH (ref 11.5–15.5)
WBC: 9.4 K/uL (ref 4.0–10.5)
nRBC: 0.4 % — ABNORMAL HIGH (ref 0.0–0.2)

## 2024-11-14 LAB — GLUCOSE, CAPILLARY
Glucose-Capillary: 141 mg/dL — ABNORMAL HIGH (ref 70–99)
Glucose-Capillary: 169 mg/dL — ABNORMAL HIGH (ref 70–99)
Glucose-Capillary: 51 mg/dL — ABNORMAL LOW (ref 70–99)
Glucose-Capillary: 53 mg/dL — ABNORMAL LOW (ref 70–99)
Glucose-Capillary: 57 mg/dL — ABNORMAL LOW (ref 70–99)
Glucose-Capillary: 152 mg/dL — ABNORMAL HIGH (ref 70–99)
Glucose-Capillary: 138 mg/dL — ABNORMAL HIGH (ref 70–99)

## 2024-11-14 LAB — RENAL FUNCTION PANEL
Albumin: 3 g/dL — ABNORMAL LOW (ref 3.5–5.0)
Anion gap: 13 (ref 5–15)
BUN: 41 mg/dL — ABNORMAL HIGH (ref 8–23)
CO2: 21 mmol/L — ABNORMAL LOW (ref 22–32)
Calcium: 7.4 mg/dL — ABNORMAL LOW (ref 8.9–10.3)
Chloride: 92 mmol/L — ABNORMAL LOW (ref 98–111)
Creatinine, Ser: 6.37 mg/dL — ABNORMAL HIGH (ref 0.61–1.24)
GFR, Estimated: 9 mL/min — ABNORMAL LOW
Glucose, Bld: 170 mg/dL — ABNORMAL HIGH (ref 70–99)
Phosphorus: 3 mg/dL (ref 2.5–4.6)
Potassium: 4.8 mmol/L (ref 3.5–5.1)
Sodium: 126 mmol/L — ABNORMAL LOW (ref 135–145)

## 2024-11-14 LAB — APTT: aPTT: 41 s — ABNORMAL HIGH (ref 24–36)

## 2024-11-14 LAB — VANCOMYCIN, RANDOM: Vancomycin Rm: 32 ug/mL

## 2024-11-14 LAB — MAGNESIUM: Magnesium: 2 mg/dL (ref 1.7–2.4)

## 2024-11-14 MED ORDER — HEPARIN SODIUM (PORCINE) 1000 UNIT/ML DIALYSIS: 1000.0000 [IU] | INTRAMUSCULAR | Status: DC | PRN

## 2024-11-14 MED ORDER — HEPARIN SODIUM (PORCINE) 1000 UNIT/ML DIALYSIS
20.0000 [IU]/kg | INTRAMUSCULAR | Status: DC | PRN
  Administered 2024-11-14 – 2024-11-19 (×3): 2400 [IU] via INTRAVENOUS_CENTRAL
  Filled 2024-11-14 (×4): qty 3

## 2024-11-14 MED ORDER — SEVELAMER CARBONATE 800 MG PO TABS
2400.0000 mg | ORAL_TABLET | Freq: Three times a day (TID) | ORAL | Status: DC
  Administered 2024-11-14 (×2): 2400 mg via ORAL
  Filled 2024-11-14 (×2): qty 3

## 2024-11-14 MED ORDER — PENTAFLUOROPROP-TETRAFLUOROETH EX AERO: 1.0000 | INHALATION_SPRAY | CUTANEOUS | Status: DC | PRN

## 2024-11-14 MED ORDER — DEXTROSE 50 % IV SOLN
25.0000 g | Freq: Once | INTRAVENOUS | Status: AC
  Administered 2024-11-14: 25 g via INTRAVENOUS

## 2024-11-14 MED ORDER — LIDOCAINE-PRILOCAINE 2.5-2.5 % EX CREA: 1.0000 | TOPICAL_CREAM | CUTANEOUS | Status: DC | PRN

## 2024-11-14 MED ORDER — ATOMOXETINE HCL 10 MG PO CAPS
10.0000 mg | ORAL_CAPSULE | Freq: Two times a day (BID) | ORAL | Status: DC
  Administered 2024-11-14 – 2024-11-15 (×3): 10 mg via ORAL
  Filled 2024-11-14 (×3): qty 1

## 2024-11-14 MED ORDER — VANCOMYCIN HCL 750 MG/150ML IV SOLN
750.0000 mg | INTRAVENOUS | Status: DC
  Administered 2024-11-16 – 2024-11-21 (×3): 750 mg via INTRAVENOUS
  Filled 2024-11-14 (×4): qty 150

## 2024-11-14 MED ORDER — LIDOCAINE HCL (PF) 1 % IJ SOLN: 5.0000 mL | INTRAMUSCULAR | Status: DC | PRN

## 2024-11-14 MED ORDER — ENSURE PLUS HIGH PROTEIN PO LIQD
237.0000 mL | ORAL | Status: DC
  Administered 2024-11-15 – 2024-11-22 (×7): 237 mL via ORAL

## 2024-11-14 MED ORDER — ANTICOAGULANT SODIUM CITRATE 4% (200MG/5ML) IV SOLN: 5.0000 mL | Status: DC | PRN

## 2024-11-14 MED ORDER — DEXTROSE 50 % IV SOLN
INTRAVENOUS | Status: AC
  Filled 2024-11-14: qty 50

## 2024-11-14 NOTE — Progress Notes (Signed)
 Pharmacy Antibiotic Note  [name] [name]. is a 63 y.o. male admitted on 10/24/2024 with MRSA bacteremia and PNA  Pharmacy has been consulted for vancomycin  dosing.  Patient off CRRT since 12/22 15:13. Received vancomycin  1250 mg after CRRT discontinued.  Had iHD 12/23 3.5 hours (treatment ended 12:30), Post HD vanc level at 14:36 is 34, which is supratherapeutic. 12/26 iHD again 3.5 hours. 12/29 pre-HD vancomycin  level = 32 (above target of 15-25).   Plan: Due for dialysis today  Vancomycin  on hold today, will resume vancomycin  750 mg after dialysis (rather than 1000 mg post-dialysis due to high levels) at next HD session    Height: 6' 1 (185.4 cm) Weight: 123.4 kg (272 lb 0.8 oz) IBW/kg (Calculated) : 79.9  Temp (24hrs), Avg:98.5 F (36.9 C), Min:97.8 F (36.6 C), Max:99.1 F (37.3 C)  Recent Labs  Lab 11/08/24 1426 11/09/24 0606 11/09/24 0607 11/10/24 0625 11/11/24 0329 11/12/24 0400 11/13/24 0300 11/14/24 0300  WBC  --   --  6.5 7.8  --  8.1 11.2* 9.4  CREATININE  --    < >  --  4.75* 5.56* 4.72* 5.51* 6.37*  VANCORANDOM 34  --   --   --   --   --   --  32   < > = values in this interval not displayed.    Estimated Creatinine Clearance: 16.5 mL/min (A) (by C-G formula based on SCr of 6.37 mg/dL (H)).    Allergies[1]  Antimicrobials this admission: Vanc 12/8>(12/09/24) CTX x1 12/8, 12/19 > 12/23 Azith x1 12/8 Cefepime  12/8  Dose adjustments this admission: 12/20 CRRT started, dose adjusted vancomycin    Microbiology results: 12/18 BCX Ngtd 12/12 BCX: NGx5 12/10 pleural fluid no orgs 12/10 Bcx: NGx5 12/8 Bcx: 4/4 MRSA MRSA PCR: +  Thank you for allowing pharmacy to be a part of this patients care.  Rankin Sams, PharmD, BCPS, BCCCP Clinical Pharmacist    [1]  Allergies Allergen Reactions   Sulfa Antibiotics Diarrhea and Nausea And Vomiting

## 2024-11-14 NOTE — TOC Progression Note (Signed)
 Transition of Care (TOC) - Progression Note    Patient Details  Name: [name] BRANTON Sr. MRN: [id number] Date of Birth: [birth year]  Transition of Care [facility]) CM/SW Contact  Lauraine FORBES Saa, LCSWA Phone Number: 11/14/2024, 4:17 PM  Clinical Narrative:     4:17 PM CSW returned to patient's bedside and provided SNF bed offers [facility], [facility], [facility]) with their quarterly Medicare ratings. CSW will continue to follow.  Expected Discharge Plan: Skilled Nursing Facility Barriers to Discharge: Continued Medical Work up, English As A Second Language Teacher, SNF Pending bed offer               Expected Discharge Plan and Services In-house Referral: Clinical Social Work   Post Acute Care Choice: Skilled Nursing Facility Living arrangements for the past 2 months: Single Family Home                                       Social Drivers of Health (SDOH) Interventions SDOH Screenings   Food Insecurity: No Food Insecurity (10/24/2024)  Housing: Low Risk (10/24/2024)  Transportation Needs: No Transportation Needs (10/24/2024)  Utilities: Not At Risk (10/24/2024)  Alcohol Screen: Low Risk (02/04/2022)  Depression (PHQ2-9): Low Risk (08/06/2022)  Tobacco Use: Low Risk (10/26/2024)    Readmission Risk Interventions    10/25/2024    1:31 PM 02/11/2024    9:09 PM 11/05/2022    3:08 PM  Readmission Risk Prevention Plan  Transportation Screening Complete Complete Complete  Medication Review Oceanographer) Complete Complete Complete  PCP or Specialist appointment within 3-5 days of discharge Not Complete Complete Complete  HRI or Home Care Consult Complete Complete Complete  SW Recovery Care/Counseling Consult Complete  Complete  Palliative Care Screening Not Applicable Not Applicable Complete  Skilled Nursing Facility Not Complete Complete Complete

## 2024-11-14 NOTE — Progress Notes (Signed)
" °   11/14/24 1310  Vitals  Temp 98.6 F (37 C)  Pulse Rate 89  Resp (!) 23  BP 125/64  SpO2 99 %  O2 Device Room Air  Weight 120.4 kg  Type of Weight Post-Dialysis  Oxygen Therapy  Patient Activity (if Appropriate) In bed  Pulse Oximetry Type Continuous  Oximetry Probe Site Changed No  Post Treatment  Dialyzer Clearance Clear  Hemodialysis Intake (mL) 0 mL  Liters Processed 78.8  Fluid Removed (mL) 3000 mL  Tolerated HD Treatment Yes   Pt tx done at bedside--61m07 Alert and oriented.  Informed consent signed and in chart.   TX duration:3.5  Patient tolerated well.  Alert, without acute distress.  Hand-off given to patient's nurse.   Access used: L femoral trialysis Access issues: no complications  Total UF removed: 3000---no mididrine or albumin  required for this tx Medication(s) given: none   [name] "

## 2024-11-14 NOTE — Progress Notes (Signed)
 " Daily Progress Note   Date: 11/14/2024   Patient Name: [name] KLOEPFER Sr.  DOB: January 15, 1961  MRN: [id number]  Age / Sex: 63 y.o., male  Attending Physician: Neda Jennet LABOR, [name] Primary Care Physician: [name], [name] Admit Date: 10/24/2024 Length of Stay: 21 days  Reason for Follow-up: Establishing goals of care  Past Medical History:  Diagnosis Date   ESRD on hemodialysis (HCC)    a. HD MWF > 10 years.   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 01/2022 Echo: EF 45-50%, glob HK, mod LVH, mildly reduced RV fxn, mild MR, mod-sev MV Ca2+. Mild AS; b. 10/2023 Echo: EF 45-50%, glob HK, GrII DD, mildly reduced RV fxn, RVSP 52.103mmHg. Mild MR/MS, mod TR.   Hypertension    Nonischemic cardiomyopathy (HCC)    a. 2019 Cath [facility]): Nonobstructive disease; b. 12/2019 Echo: EF 50%; c. 12/2019 MV (Tx w/u [facility]): Ant infarct w/ peri-infarct ischemia. EF 35%; c. 05/2021 MV (Tx w/u [facility]): Cor Ca2+, EF 53%, motion artifact, equiv study, likely low risk.   Nonobstructive coronary artery disease    a.  2019 Cath ([place] [facility]): Left main normal, LAD 30 proximal, RCA 30 distal, EF 25%.   Obesity    Peripheral arterial disease    Peripheral vascular disease    Type 2 diabetes mellitus (HCC)    a. 10/2023 - admission w/ HHS.    Assessment & Plan:   HPI/Patient Profile:   63 y.o. male  with past medical history of ESRD on HD, HFrEF (20-25% EF), T2DM, atrial fibrillation admitted on 10/24/2024 with MRSA bacteremia and AHRF 2/2 pneumonia. Per H&P on 10/24/2024 by Pearlean [name], patient presented to [facility] for 2-3 weeks of shortness of breath. CT chest on 10/24/2024 demonstrated multilobar bonchopneumonia. Blood cultures on 10/24/2024 positive for MRSA bacteremia. 10/26/2024 had left thoracentesis with 450 mL transudate fluid removed with negative cultures. Transferred to [facility] 11/02/2024 for management of septic shock and IR evaluation for [facility] placement which has not been  completed due to continued pressor needs.   Palliative medicine consulted for goals of care conversation.   SUMMARY OF RECOMMENDATIONS Full code, full scope, will continue further goals of care conversation with patient and family as they are uncertain at this time Palliative medicine team will continue to follow for more conversations  Symptom Management:  Per primary team  Code Status: Full Code  Prognosis: Unable to determine  Discharge Planning: To Be Determined  Discussed with: [name] [name] 11/14/2024 about discussion with patient and daughter about possibly transitioning to hospice or comfort measures, but family would like further discussions.   Subjective:   Subjective: Chart Reviewed. Updates received. Patient Assessed. Created space and opportunity for patient  and family to explore thoughts and feelings regarding current medical situation.  Today's Discussion:  Met with the patient at bedside without any visitors but he called his daughter on FaceTime.  We discussed her conversations over the last 2 days with the patient's brother and the patient's daughter who are able to visit.  Patient shares that he told his daughter yesterday that he wants to die.  Explored his wishes with the daughter, daughter shares that it is because he is not able to get his wish of going home, he knows that he needs to go to a rehab facility to get stronger, and he does not want to pursue rehab.  Patient also shares that he does not want to be a burden on  his family.  Patient lives with his brother who is older than him who just recently retired.  His brother has been caring for his elderly parents for many years, and now has to take care of the patient.  Patient is aware that he is a burden on his family, his brother shares that his arm was hurt in the process of caring for the patient over the last few weeks due to his deterioration and weakness.  Patient just wants to go home, but daughter shares that  he is not strong enough to go home at this point.   Shared with the patient and daughter that the medical team is concerned given his advanced heart failure as well as end-stage renal disease requiring dialysis.  The patient's blood pressure requires midodrine  which is a medication that he can take at home and he has taken in the past, but now he also requires IV medications in order to raise his blood pressure that can only be given in the ICU.  Shared with him that the medical team wants more guidance as the patient cannot perpetually stay in the ICU or the hospital.   Discussed CODE STATUS with patient and daughter and shared with them that the overall chances of successful return of spontaneous circulation given the patient's extensive heart failure as well as other comorbid conditions is really low.  Shared with him that if we continue aggressive measures that the patient's goal of returning home will be further away.  Discussed with him that the chances the patient ever returning home is unlikely and that he may need to be at a skilled nursing facility for the rest of his life.   Discussed options with the family and talked about 2 pathways.  One pathway is that the medical team can continue his current management knowing that the patient will eventually decline at some point but that he may just have to be at the hospital for longer than he would like.  A second pathway was offered to the family and patient and that his focusing on comfort meaning that we would have to transition to hospice, knowing that the time that the patient has left is short.  Discussed hospice philosophy with patient and family and shared with them that it would mean discontinuing all medications that the patient is on right now and also discontinuing hemodialysis knowing that time would be very limited at that point when dialysis is stopped.  Patient and daughter became tearful.  Daughter suggested the patient speak with his  ex-girlfriend, the mother of the patient's daughter, as they still remain close.  Family and patient were not ready to make a decision yet at this point but would like more time to consider their options.  Review of Systems  All other systems reviewed and are negative.   Objective:   Primary Diagnoses: Present on Admission:  Multifocal pneumonia  Below-knee amputation of right lower extremity (HCC)  Chronic systolic CHF (congestive heart failure) (HCC)  Type 2 diabetes mellitus with diabetic chronic kidney disease (HCC)  Severe sepsis with septic shock  MRSA bacteremia  Hypotension  CAD (coronary artery disease)  Peripheral vascular disease, unspecified   Vital Signs:  BP 125/64   Pulse 83   Temp 98.5 F (36.9 C) (Oral)   Resp 15   Ht 6' 1 (1.854 m)   Wt 120.4 kg   SpO2 97%   BMI 35.02 kg/m   Physical Exam HENT:     Head: Normocephalic  and atraumatic.  Eyes:     Extraocular Movements: Extraocular movements intact.  Cardiovascular:     Rate and Rhythm: Normal rate.  Pulmonary:     Effort: Pulmonary effort is normal.  Abdominal:     Palpations: Abdomen is soft.  Skin:    General: Skin is warm and dry.  Neurological:     General: No focal deficit present.     Mental Status: He is alert.  Psychiatric:     Comments: Depressed. Flat affect at times.     Palliative Assessment/Data: 50%   Existing Vynca/ACP Documentation: HCA appointed - Daughter [name] [name]  Thank you for allowing us  to participate in the care of [name] CONDREY Sr. PMT will continue to support holistically.  I personally spent a total of 50 minutes in the care of the patient today including preparing to see the patient, getting/reviewing separately obtained history, performing a medically appropriate exam/evaluation, counseling and educating, referring and communicating with other health care professionals, and documenting clinical information in the EHR.   [name] [name] [name] [name]  Palliative  Medicine Team  Team Phone # [phone] (Nights/Weekends) 11/14/2024 5:09 PM  "

## 2024-11-14 NOTE — Progress Notes (Signed)
 eLink Physician-Brief Progress Note Patient Name: [name] [name]. DOB: 1961/09/03 MRN: [id number]   Date of Service  11/14/2024  HPI/Events of Note  Notified that patient just had a 10 beat run of V. tach.  He remains on norepinephrine  at 0.3 mcg/kg/min.  Blood pressure is 105/55.  eICU Interventions  Noted.  Chemistry sent.  Awaiting results.     Intervention Category Intermediate Interventions: Other:  Jerilynn Berg 11/14/2024, 3:25 AM

## 2024-11-14 NOTE — Plan of Care (Signed)

## 2024-11-14 NOTE — Progress Notes (Signed)
 Nutrition Follow-up  DOCUMENTATION CODES:  Not applicable  INTERVENTION:  Continue current diet as ordered 1.2L fluid restriction remains in place Continue Ensure Plus High Protein po daily, each supplement provides 350 kcal and 20 grams of protein  Renal MVI with minerals daily Daily weights to assess volume overload and nutrition status  NUTRITION DIAGNOSIS:  Increased nutrient needs related to chronic illness as evidenced by estimated needs. - remains applicable  GOAL:  Patient will meet greater than or equal to 90% of their needs - progressing  MONITOR:  PO intake, Supplement acceptance, Labs, Weight trends  REASON FOR ASSESSMENT:  Consult Assessment of nutrition requirement/status  ASSESSMENT:  Pt with PMH significant for: ESRD-HD, afib, HFrEF, HTN, T2DM, CAD, severe PVD s/p R BKA. Presented to [facility] ED (12/08) and found to have multifocal PNA on CT and small effusions. Bcx positive for MRSA. Transferred to [facility] thirteen days after admission for management of septic shock and IR eval for tunneled HD catheter placement.  12/08 - admitted 12/10 - L thoracentesis: yield 12/11 - echo: EF 20-25% 12/12 - line holiday 12/15 - new femoral HD cath placed 12/17 - txr to [facility] ICU 12/20 - CRRT initiated 12/21 - a-line placed  12/22 - off CRRT 12/23 - HD trial  Pt resting in bed at the time of assessment undergoing HD this AM. Continues to have a good appetite and is overall unchanged from last assessment.   Discussed in rounds, pt remains in ICU as he is not tolerating HD without pressor support. MD has discussed this barrier to discharge with family. They want to remain with aggressive measures at this time.  Significantly up from EDW, not tolerating volume removal, will decrease supplements to 1x/d to decrease volume as pt's intake is likely adequate to support needs.   Meal completions: 12/21: 10% x1 documented offering 12/22: 75-100% x2 documented  offerings 12/23: 100% x2 documented meals 12/24-12/29: 95% average intake x 8 recorded meals    Admit Weight: 104.3 kg Current Weight: 110.4 kg EDW: 102 kg   Dialysis Orders: MWF [facility] 4h  B400  101.9kg  2K bath  TDC  Hep 6000 - Hectoral 5mcg IV q HD - No ESA/iron     Medications:  Scheduled Meds:  feeding supplement  237 mL Oral BID BM   fludrocortisone   0.1 mg Oral Daily   insulin  aspart  0-15 Units Subcutaneous TID WC   insulin  aspart  4 Units Subcutaneous TID WC   insulin  glargine  8 Units Subcutaneous Daily   multivitamin  1 tablet Oral QHS   pantoprazole   40 mg Oral Daily   sevelamer  carbonate  2,400 mg Oral TID WC   Continuous Infusions:  norepinephrine  (LEVOPHED ) Adult infusion 3 mcg/min (11/14/24 0800)   [START ON 11/16/2024] vancomycin      PRN Meds: phenol, polyethylene glycol  Labs:  Sodium 126, chloride 92 BUN 41, creatinine 6.37 CBG ranges from 108-178 mg/dL over the last 24 hours HgbA1c 8.6% (12/8)  NUTRITION - FOCUSED PHYSICAL EXAM: No significant muscle or fat depletions on exam. Notably, could be skewed by body habitus and excessive adipose tissue as well as continued volume overload. Will benefit from follow up NFPE when edema improved.  Flowsheet Row Most Recent Value  Orbital Region No depletion  Upper Arm Region No depletion  Thoracic and Lumbar Region No depletion  Buccal Region No depletion  Temple Region No depletion  Clavicle Bone Region No depletion  Clavicle and Acromion Bone Region No depletion  Scapular Bone Region Unable to assess  Dorsal Hand No depletion  Patellar Region Unable to assess  [edema]  Anterior Thigh Region Unable to assess  [edema]  Posterior Calf Region Unable to assess  [edema,  R BKA]  Edema (RD Assessment) Moderate  Hair Reviewed  Eyes Reviewed  Mouth Reviewed  Skin Reviewed  Nails Reviewed     Diet Order:   Diet Orders (From admission, onward)     Start     Ordered   11/06/24 1650  Diet Carb Modified  Room service appropriate? Yes; Fluid restriction: 1200 mL Fluid  Diet effective now       Question Answer Comment  Diet-HS Snack? Nothing   Calorie Level Medium 1600-2000   Fluid consistency: Thin   Room service appropriate? Yes   Fluid restriction: 1200 mL Fluid      11/06/24 1653            EDUCATION NEEDS:  No education needs have been identified at this time  Skin:  Skin Assessment: Skin Integrity Issues: Skin Integrity Issues:: Stage II Stage II: coccyx  Last BM:  12/28 - type 4  Height:  Ht Readings from Last 1 Encounters:  10/24/24 6' 1 (1.854 m)   Weight:  Wt Readings from Last 1 Encounters:  11/14/24 123.4 kg   Ideal Body Weight:  78.6 kg (adjusted for R BKA)  BMI:  Body mass index is 35.89 kg/m.  Estimated Nutritional Needs:  Kcal:  2100-2300 kcals Protein:  105-120g Fluid:  1L + UOP   Vernell Lukes, RD, LDN, CNSC Registered Dietitian II Please reach out via secure chat

## 2024-11-14 NOTE — Progress Notes (Signed)
 [facility] NEPHROLOGY PROGRESS NOTE  Assessment/ Plan: Pt is a 63 y.o. yo male   Dialysis Orders: MWF [facility] 4h  B400  101.9kg  2K bath  TDC  Hep 6000 - Hectoral 5mcg IV q HD - No ESA/iron   # ESRD:  - s/p CRRT vol overload w/ shock 12/20-12/22.  Then back on iHD but not tolerating well due to hypotension  - HD on MWF schedule here  - Dialysis has been challenging because of persistent hypotension.  BP remains low with ongoing need of Levophed .  May not be a candidate for outpatient dialysis due to same.  Noted palliative care team is following.  # Septic/cardiogenic shock due to MRSA bacteremia:  - abx per primary team  - on Levophed .   - TDC was removed, status post temporary HD line.IR consulted for new line - plan is to get a new tunneled catheter once off pressors and out of ICU.  # Hypotension/shock:  - On midodrine  20 mg 3 times daily; on Levophed .   - noted fludrocortisone  was added by ICU team.  # Acute hypoxic respiratory failure due to fluid overload, acute systolic CHF and pneumonia:  - optimize volume status with HD - on antibiotics per PCCM.  #Hyponatremia:  - hypervolemic  - Unable to ultrafiltrate much because of persistent hypotension.   - Please limit fluids - confirmed on 1.2 liter fluid restriction.  # Anemia of ESRD:  - Received PRBC - iron  saturation 8%, high ferritin due to inflammation.  S/p IV iron   - on aranesp  60 mcg every Thursday   #Secondary HPTH:   - phos ok but lower than goal - reduced sevelamer  to 2400 mg TID with meals - pause cinacalcet  for hypocalcemia and consider resuming at reduced dose   Disposition - in ICU    Subjective:  Last HD on 12/26 with 0.5 kg UF.  No UOP over 12/28 charted.  Had Vtach during last HD treatment and charted as having vtach early this AM.  Mag ok. Levo is on at 3 mcg/min.  He feels ok.  His a line appears positional and RN has come to assess BP.  From his standpoint, he states that HD is  going ok - I shared that the treatments had been difficult due to low blood pressure.   Review of systems:   Denies shortness of breath or chest pain  Some nausea  Denies dizziness or cramping   Objective Vital signs in last 24 hours: Vitals:   11/13/24 2324 11/14/24 0000 11/14/24 0421 11/14/24 0500  BP:      Pulse: 77 76    Resp: (!) 21 (!) 27    Temp: 99.1 F (37.3 C)  98.6 F (37 C)   TempSrc: Oral  Oral   SpO2: (!) 89% (!) 88%    Weight:    123.4 kg  Height:       Weight change: 4.4 kg  Intake/Output Summary (Last 24 hours) at 11/14/2024 0758 Last data filed at 11/14/2024 0600 Gross per 24 hour  Intake 283.67 ml  Output --  Net 283.67 ml       Labs: RENAL PANEL Recent Labs  Lab 11/10/24 0625 11/11/24 0329 11/11/24 0330 11/12/24 0400 11/13/24 0300 11/14/24 0300  NA 128* 129*  --  126* 126* 126*  K 4.5 4.4  --  4.4 4.5 4.8  CL 95* 95*  --  92* 92* 92*  CO2 18* 22  --  22 22 21*  GLUCOSE 234* 69*  --  208* 179* 170*  BUN 24* 29*  --  26* 34* 41*  CREATININE 4.75* 5.56*  --  4.72* 5.51* 6.37*  CALCIUM  7.7* 7.5*  --  7.4* 7.4* 7.4*  MG 2.1  --  2.0 1.9 2.0 2.0  PHOS 3.2 3.5  --  2.6 2.8 3.0  ALBUMIN  3.1* 3.0*  --  3.0* 3.0* 3.0*    Liver Function Tests: Recent Labs  Lab 11/12/24 0400 11/13/24 0300 11/14/24 0300  ALBUMIN  3.0* 3.0* 3.0*   No results for input(s): LIPASE, AMYLASE in the last 168 hours. No results for input(s): AMMONIA in the last 168 hours. CBC: Recent Labs    11/16/23 1518 11/17/23 0245 11/24/23 1306 11/25/23 1108 11/30/23 1351 12/01/23 0246 11/09/24 0607 11/09/24 1036 11/10/24 0625 11/12/24 0400 11/13/24 0300 11/14/24 0300  HGB  --    < >  --    < >  --    < > 9.8*  --  10.9* 10.5* 10.3* 10.9*  MCV  --    < >  --    < >  --    < > 78.3*  --  78.7* 77.0* 77.4* 76.6*  VITAMINB12 2,058*  --   --   --   --   --   --  1,949*  --   --   --   --   FOLATE 16.6  --   --   --   --   --   --  12.7  --   --   --   --    FERRITIN  --   --  421*  --  342*  --   --  162  --   --   --   --   TIBC  --   --  294  --  322  --   --  232*  --   --   --   --   IRON   --   --  46  --  30*  --   --  18*  --   --   --   --   RETICCTPCT  --   --   --   --   --   --   --  2.2  --   --   --   --    < > = values in this interval not displayed.    Cardiac Enzymes: No results for input(s): CKTOTAL, CKMB, CKMBINDEX, TROPONINI in the last 168 hours. CBG: Recent Labs  Lab 11/13/24 1124 11/13/24 1242 11/13/24 1538 11/13/24 2209 11/14/24 0740  GLUCAP 173* 178* 107* 122* 141*    Iron  Studies:  No results for input(s): IRON , TIBC, TRANSFERRIN, FERRITIN in the last 72 hours.  Studies/Results: No results found.   Medications: Infusions:  anticoagulant sodium citrate      anticoagulant sodium citrate      norepinephrine  (LEVOPHED ) Adult infusion 3 mcg/min (11/14/24 0019)    Scheduled Medications:  aspirin  EC  81 mg Oral Q breakfast   benzonatate   200 mg Oral TID   Chlorhexidine  Gluconate Cloth  6 each Topical Q0600   Chlorhexidine  Gluconate Cloth  6 each Topical Q0600   cinacalcet   60 mg Oral Q breakfast   darbepoetin (ARANESP ) injection - DIALYSIS  60 mcg Subcutaneous Q Thu-1800   feeding supplement  237 mL Oral BID BM   fludrocortisone   0.1 mg Oral Daily   guaiFENesin   600 mg Oral BID  heparin   5,000 Units Subcutaneous Q8H   insulin  aspart  0-15 Units Subcutaneous TID WC   insulin  aspart  4 Units Subcutaneous TID WC   insulin  glargine  8 Units Subcutaneous Daily   midodrine   20 mg Oral Q8H   multivitamin  1 tablet Oral QHS   pantoprazole   40 mg Oral Daily   sevelamer  carbonate  3,200 mg Oral TID WC   vancomycin  variable dose per unstable renal function (pharmacist dosing)   Does not apply See admin instructions    have reviewed scheduled and prn medications.  Physical Exam:   General adult male in bed in no acute distress HEENT normocephalic atraumatic extraocular movements intact  sclera anicteric Neck supple trachea midline Lungs clear to auscultation bilaterally normal work of breathing at rest on room air Heart S1S2 no rub Abdomen soft nontender obese habitus Extremities right BKA; 2-3+ edema  Psych normal mood and affect Neuro awake and conversant    Katheryn BROCKS Yuritzi Kamp 11/14/2024,8:18 AM  LOS: 21 days

## 2024-11-14 NOTE — Progress Notes (Signed)
 Hypoglycemia Event CBG 53 1530 - 8 ounces of Ginger Ale CBG 57 1604 - 25g Dextrose   CBG 152 1630 Patient LOCx4

## 2024-11-14 NOTE — Progress Notes (Signed)
 "  NAME:  [name] Weimer., MRN:  [id number], DOB:  [birth year], LOS: 21 ADMISSION DATE:  10/24/2024, CONSULTATION DATE:  12/17 REFERRING MD:  Ricky - [facility], CHIEF COMPLAINT:  hypotension  History of Present Illness:  [name] HESSLING Sr. is a 63 y.o. male who has a PMH as outlined below including but not limited to ESRD on HD MWF, A-fib, systolic CHF, hypertension, LBBB, DM.  He presented to [facility] ED on 10/24/2024 with dyspnea x roughly 2-3 weeks. He had been seen in the ED a few days early on 12/2 for dysuria and was started on a course of Cipro  which she started the following day and stated that he had been taking as prescribed.     CT chest that demonstrated multifocal pneumonia and small effusions.  Blood cultures ultimately grew MRSA.  He had been on vancomycin  which was continued. He had a left thora 12/10 with 450cc fluid removed.  His HD catheter was removed on 10/28/2024 as well as an arterial line.  He did have a TTE on 12/11 that showed an EF of 20-25%, G2 DD, mild decrease in RV SF, severe AAS, no vegetations.  TEE was recommended but had been deferred due to patient requiring vasopressor support.  ID was consulted and recommended line holiday.  He had a temporary femoral HD cath placed on 12/15.  ID recommendations included 6 weeks of vancomycin  to end on 12/09/2024.    12/17, he was transferred to [facility] for ongoing management for septic shock as well as for IR evaluation for tunneled HD catheter placement  Pertinent  Medical History  ESRD on HD MWF, Afib, HFrEF, T2DM, LBBB, severe PVD s/p R BKA Past Medical History:  Diagnosis Date   ESRD on hemodialysis (HCC)    a. HD MWF > 10 years.   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 01/2022 Echo: EF 45-50%, glob HK, mod LVH, mildly reduced RV fxn, mild MR, mod-sev MV Ca2+. Mild AS; b. 10/2023 Echo: EF 45-50%, glob HK, GrII DD, mildly reduced RV fxn, RVSP 52.27mmHg. Mild MR/MS, mod TR.   Hypertension    Nonischemic  cardiomyopathy (HCC)    a. 2019 Cath [facility]): Nonobstructive disease; b. 12/2019 Echo: EF 50%; c. 12/2019 MV (Tx w/u [facility]): Ant infarct w/ peri-infarct ischemia. EF 35%; c. 05/2021 MV (Tx w/u [facility]): Cor Ca2+, EF 53%, motion artifact, equiv study, likely low risk.   Nonobstructive coronary artery disease    a.  2019 Cath ([facility]): Left main normal, LAD 30 proximal, RCA 30 distal, EF 25%.   Obesity    Peripheral arterial disease    Peripheral vascular disease    Type 2 diabetes mellitus (HCC)    a. 10/2023 - admission w/ HHS.   Significant Hospital Events: Including procedures, antibiotic start and stop dates in addition to other pertinent events   12/8 admit 12/10 L thora with 450cc removed, transudate, neg cultures 12/11 echo 12/12 HD cath and art line removed 12/15 new femoral HD cath placed 12/17 transfer to [facility] 34M 12/19 HD  12/20 CRRT 12/23 Weaned off levophed  this am. HD trial this am. Transferred to [facility] 12/25 Returned to ICU for hypotension 12/28-remains on Levophed  12/29-remains on Levophed , tolerating dialysis  Interim History / Subjective:  Still having intermittent cough On midodrine , Levophed , fludrocortisone  Awake alert interactive with no complaints Objective    Blood pressure (!) 124/59, pulse 85, temperature 98.7 F (37.1 C), resp. rate 20, height  6' 1 (1.854 m), weight 123.4 kg, SpO2 98%.        Intake/Output Summary (Last 24 hours) at 11/14/2024 1133 Last data filed at 11/14/2024 9177 Gross per 24 hour  Intake 420.54 ml  Output --  Net 420.54 ml   Filed Weights   11/13/24 0311 11/14/24 0500 11/14/24 0856  Weight: 119 kg 123.4 kg 123.4 kg   Physical Exam: General: Middle-age, does not appear to be in distress HENT: Moist oral mucosa Eyes: Anicteric yes Respiratory: Clear breath sounds Cardiovascular: S1-S2 appreciated with no murmur GI: Soft abdomen, bowel sounds appreciated Extremities: Right below-knee amputation, 2+ pitting  edema in left lower extremity neuro: Awake alert interactive Psych: Normal affect  I reviewed last 24 h vitals and pain scores, last 48 h intake and output, last 24 h labs and trends, and last 24 h imaging results.  Blood culture 12/18 with no growth - On renal dose vancomycin  for MRSA  Resolved problem list   Assessment and Plan   Multifocal pneumonia Acute hypoxemic respiratory failure - Remains on room air - Bronchodilators as needed  Heart failure with reduced ejection fraction of 20 to 25% Severe aortic stenosis - Continue medical management - GDMT is limited in the setting of end-stage renal disease on pressor requirement - Continue volume removal per nephrology  Septic shock secondary to MRSA bacteremia/pneumonia - Completed 5 days of ceftriaxone  - Pressors reinitiated on 12/24 - Vancomycin  goal is 2 and 12/09/2024 - Left femoral HD cath was placed on 12/15 -To have permanent HD cath replaced by IR, IR should be consulted when he is ready to have this performed once clinically stable  Chronic hypotension - On midodrine , started on Levophed  at present - Will trial atomoxetine  to see if we can get him off Levophed  - On midodrine  20 3 times daily, fludrocortisone  0.1  End-stage renal disease on hemodialysis - Continue Cinacalcet  and sevelamer   Chronic hyponatremia - Continue to trend  Type 2 diabetes - Continue SSI, Lantus   Peripheral vascular disease - Continue aspirin   DVT prophylaxis  The patient is critically ill with multiple organ systems failure and requires high complexity decision making for assessment and support, frequent evaluation and titration of therapies, application of advanced monitoring technologies and extensive interpretation of multiple databases. Critical Care Time devoted to patient care services described in this note independent of APP/resident time (if applicable)  is 30 minutes.   Jennet Epley MD [facility] Personal pager: See Amion If unanswered, please page CCM On-call: #[phone]  "

## 2024-11-15 DIAGNOSIS — R6521 Severe sepsis with septic shock: Secondary | ICD-10-CM | POA: Diagnosis not present

## 2024-11-15 DIAGNOSIS — A4102 Sepsis due to Methicillin resistant Staphylococcus aureus: Secondary | ICD-10-CM | POA: Diagnosis not present

## 2024-11-15 DIAGNOSIS — J188 Other pneumonia, unspecified organism: Secondary | ICD-10-CM | POA: Diagnosis not present

## 2024-11-15 DIAGNOSIS — E871 Hypo-osmolality and hyponatremia: Secondary | ICD-10-CM | POA: Diagnosis not present

## 2024-11-15 DIAGNOSIS — I5020 Unspecified systolic (congestive) heart failure: Secondary | ICD-10-CM | POA: Diagnosis not present

## 2024-11-15 DIAGNOSIS — Z7189 Other specified counseling: Secondary | ICD-10-CM | POA: Diagnosis not present

## 2024-11-15 DIAGNOSIS — Z515 Encounter for palliative care: Secondary | ICD-10-CM | POA: Diagnosis not present

## 2024-11-15 DIAGNOSIS — I9589 Other hypotension: Secondary | ICD-10-CM | POA: Diagnosis not present

## 2024-11-15 DIAGNOSIS — I132 Hypertensive heart and chronic kidney disease with heart failure and with stage 5 chronic kidney disease, or end stage renal disease: Secondary | ICD-10-CM | POA: Diagnosis not present

## 2024-11-15 DIAGNOSIS — N186 End stage renal disease: Secondary | ICD-10-CM | POA: Diagnosis not present

## 2024-11-15 DIAGNOSIS — D649 Anemia, unspecified: Secondary | ICD-10-CM | POA: Diagnosis not present

## 2024-11-15 DIAGNOSIS — E119 Type 2 diabetes mellitus without complications: Secondary | ICD-10-CM | POA: Diagnosis not present

## 2024-11-15 DIAGNOSIS — J9601 Acute respiratory failure with hypoxia: Secondary | ICD-10-CM | POA: Diagnosis not present

## 2024-11-15 DIAGNOSIS — I739 Peripheral vascular disease, unspecified: Secondary | ICD-10-CM | POA: Diagnosis not present

## 2024-11-15 LAB — GLUCOSE, CAPILLARY
Glucose-Capillary: 166 mg/dL — ABNORMAL HIGH (ref 70–99)
Glucose-Capillary: 178 mg/dL — ABNORMAL HIGH (ref 70–99)
Glucose-Capillary: 109 mg/dL — ABNORMAL HIGH (ref 70–99)
Glucose-Capillary: 90 mg/dL (ref 70–99)

## 2024-11-15 LAB — RENAL FUNCTION PANEL
Albumin: 3 g/dL — ABNORMAL LOW (ref 3.5–5.0)
Anion gap: 14 (ref 5–15)
BUN: 31 mg/dL — ABNORMAL HIGH (ref 8–23)
CO2: 20 mmol/L — ABNORMAL LOW (ref 22–32)
Calcium: 7.7 mg/dL — ABNORMAL LOW (ref 8.9–10.3)
Chloride: 91 mmol/L — ABNORMAL LOW (ref 98–111)
Creatinine, Ser: 5.46 mg/dL — ABNORMAL HIGH (ref 0.61–1.24)
GFR, Estimated: 11 mL/min — ABNORMAL LOW
Glucose, Bld: 155 mg/dL — ABNORMAL HIGH (ref 70–99)
Phosphorus: 2.8 mg/dL (ref 2.5–4.6)
Potassium: 4.4 mmol/L (ref 3.5–5.1)
Sodium: 126 mmol/L — ABNORMAL LOW (ref 135–145)

## 2024-11-15 LAB — APTT: aPTT: 36 s (ref 24–36)

## 2024-11-15 LAB — CBC
HCT: 32.7 % — ABNORMAL LOW (ref 39.0–52.0)
Hemoglobin: 11 g/dL — ABNORMAL LOW (ref 13.0–17.0)
MCH: 25.7 pg — ABNORMAL LOW (ref 26.0–34.0)
MCHC: 33.6 g/dL (ref 30.0–36.0)
MCV: 76.4 fL — ABNORMAL LOW (ref 80.0–100.0)
Platelets: 235 K/uL (ref 150–400)
RBC: 4.28 MIL/uL (ref 4.22–5.81)
RDW: 25.2 % — ABNORMAL HIGH (ref 11.5–15.5)
WBC: 8.5 K/uL (ref 4.0–10.5)
nRBC: 0.2 % (ref 0.0–0.2)

## 2024-11-15 LAB — MAGNESIUM: Magnesium: 2 mg/dL (ref 1.7–2.4)

## 2024-11-15 MED ORDER — ATOMOXETINE HCL 10 MG PO CAPS
20.0000 mg | ORAL_CAPSULE | Freq: Two times a day (BID) | ORAL | Status: DC
  Administered 2024-11-15 – 2024-11-30 (×30): 20 mg via ORAL
  Filled 2024-11-15 (×31): qty 2

## 2024-11-15 MED ORDER — SEVELAMER CARBONATE 800 MG PO TABS
800.0000 mg | ORAL_TABLET | Freq: Three times a day (TID) | ORAL | Status: DC
  Administered 2024-11-15 – 2024-11-30 (×26): 800 mg via ORAL
  Filled 2024-11-15 (×29): qty 1

## 2024-11-15 MED ORDER — CHLORHEXIDINE GLUCONATE CLOTH 2 % EX PADS
6.0000 | MEDICATED_PAD | Freq: Every day | CUTANEOUS | Status: DC
  Administered 2024-11-16 – 2024-11-17 (×2): 6 via TOPICAL

## 2024-11-15 MED ORDER — ATOMOXETINE HCL 10 MG PO CAPS
10.0000 mg | ORAL_CAPSULE | Freq: Once | ORAL | Status: AC
  Administered 2024-11-15: 10 mg via ORAL
  Filled 2024-11-15: qty 1

## 2024-11-15 NOTE — Progress Notes (Signed)
 " Daily Progress Note   Date: 11/15/2024   Patient Name: [name] DACOSTA Sr.  DOB: 09-Dec-1960  MRN: [id number]  Age / Sex: 63 y.o., male  Attending Physician: Neda Jennet LABOR, MD Primary Care Physician: [name], MD Admit Date: 10/24/2024 Length of Stay: 22 days  Reason for Follow-up: Establishing goals of care  Past Medical History:  Diagnosis Date   ESRD on hemodialysis (HCC)    a. HD MWF > 10 years.   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 01/2022 Echo: EF 45-50%, glob HK, mod LVH, mildly reduced RV fxn, mild MR, mod-sev MV Ca2+. Mild AS; b. 10/2023 Echo: EF 45-50%, glob HK, GrII DD, mildly reduced RV fxn, RVSP 52.71mmHg. Mild MR/MS, mod TR.   Hypertension    Nonischemic cardiomyopathy (HCC)    a. 2019 Cath [facility]): Nonobstructive disease; b. 12/2019 Echo: EF 50%; c. 12/2019 MV (Tx w/u [facility]): Ant infarct w/ peri-infarct ischemia. EF 35%; c. 05/2021 MV (Tx w/u [facility]): Cor Ca2+, EF 53%, motion artifact, equiv study, likely low risk.   Nonobstructive coronary artery disease    a.  2019 Cath ([facility]): Left main normal, LAD 30 proximal, RCA 30 distal, EF 25%.   Obesity    Peripheral arterial disease    Peripheral vascular disease    Type 2 diabetes mellitus (HCC)    a. 10/2023 - admission w/ HHS.    Assessment & Plan:   HPI/Patient Profile:   63 y.o. male  with past medical history of ESRD on HD, HFrEF (20-25% EF), T2DM, atrial fibrillation admitted on 10/24/2024 with MRSA bacteremia and AHRF 2/2 pneumonia. Per H&P on 10/24/2024 by Pearlean MD, patient presented to [facility] for 2-3 weeks of shortness of breath. CT chest on 10/24/2024 demonstrated multilobar bonchopneumonia. Blood cultures on 10/24/2024 positive for MRSA bacteremia. 10/26/2024 had left thoracentesis with 450 mL transudate fluid removed with negative cultures. Transferred to [facility] 11/02/2024 for management of septic shock and IR evaluation for [facility] placement which has not been  completed due to continued pressor needs.   Palliative medicine consulted for goals of care conversation.   SUMMARY OF RECOMMENDATIONS  Full code, full scope Family does share that if patient significantly deteriorates in the future they would be open to comfort measures, but at this time he is still interactive with them they want to continue full scope interventions  Symptom Management:  Per primary team  Code Status: Full Code  Prognosis: Unable to determine  Discharge Planning: To Be Determined  Subjective:   Subjective: Chart Reviewed. Updates received. Patient Assessed. Created space and opportunity for patient  and family to explore thoughts and feelings regarding current medical situation.  Today's Discussion: Met with the patient at the bedside without any visitors the patient did call his daughter and his sister-in-law to discuss his current medical conditions.  Updated family at the medical team remains concerned about his current blood pressures, that although he is currently off norepinephrine  to keep his blood pressure up his blood pressure is still very low and at this point would not be safe enough to pursue hemodialysis without using more medications to raise up his blood pressure.  Patient's daughter who is healthcare power of attorney reiterated that after further discussions with other family members she would like the patient to remain full code with full scope interventions at this time.  Reiterated that although the patient is doing well at this time and communicating, that the overall big  picture is that the combination of heart failure and end-stage renal disease makes overall prognosis poor.  They shared that they understand the medical team's concerns.  Explored their thoughts on hospice for the patient and they shared that again they would probably consider that if the patient significantly deteriorated, but at this point he still looks stable to them.  Offered to  have a full family meeting with the patient and other family members in person, family will consider.   Review of Systems  All other systems reviewed and are negative.   Objective:   Primary Diagnoses: Present on Admission:  Multifocal pneumonia  Below-knee amputation of right lower extremity (HCC)  Chronic systolic CHF (congestive heart failure) (HCC)  Type 2 diabetes mellitus with diabetic chronic kidney disease (HCC)  Severe sepsis with septic shock  MRSA bacteremia  Hypotension  CAD (coronary artery disease)  Peripheral vascular disease, unspecified   Vital Signs:  BP 125/64   Pulse 68   Temp 97.8 F (36.6 C) (Oral)   Resp (!) 28   Ht 6' 1 (1.854 m)   Wt 119.8 kg   SpO2 99%   BMI 34.85 kg/m   Physical Exam Constitutional:      Appearance: He is ill-appearing.  HENT:     Head: Normocephalic and atraumatic.  Eyes:     Extraocular Movements: Extraocular movements intact.  Cardiovascular:     Rate and Rhythm: Normal rate.  Pulmonary:     Effort: Pulmonary effort is normal.  Skin:    General: Skin is warm and dry.  Neurological:     General: No focal deficit present.     Mental Status: He is alert.  Psychiatric:        Mood and Affect: Mood normal.     Palliative Assessment/Data: 40%   Existing Vynca/ACP Documentation: HCPOA is daughter - [name] [name]  Thank you for allowing us  to participate in the care of [name] TUCKEY Sr. PMT will continue to support holistically.  I personally spent a total of 35 minutes in the care of the patient today including preparing to see the patient, performing a medically appropriate exam/evaluation, counseling and educating, and documenting clinical information in the EHR.   Fairy FORBES Shan DEVONNA  Palliative Medicine Team  Team Phone # [phone] (Nights/Weekends) 11/15/2024 5:08 PM  "

## 2024-11-15 NOTE — Progress Notes (Signed)
 [facility] NEPHROLOGY PROGRESS NOTE  Assessment/ Plan: Pt is a 63 y.o. yo male   Dialysis Orders: MWF [facility] 4h  B400  101.9kg  2K bath  TDC  Hep 6000 - Hectoral 5mcg IV q HD - No ESA/iron   # ESRD:  - s/p CRRT vol overload w/ shock 12/20-12/22.  Then back on iHD but not tolerating well due to hypotension  - HD on MWF schedule here  - Dialysis has been challenging because of persistent hypotension.  BP remains low with ongoing need of Levophed .  May not be a candidate for outpatient dialysis due to same.  Noted palliative care team is following.  # Septic/cardiogenic shock due to MRSA bacteremia:  - abx per primary team  - on Levophed .     - TDC was removed, status post temporary HD line.  IR consulted for new line - plan is to get a new tunneled catheter once off pressors and out of ICU.  # Hypotension/shock:  - On midodrine  20 mg 3 times daily; on Levophed  per pulm  - noted fludrocortisone  was added by ICU team as well as strattera .  Spoke with pharmacy and strattera  is to increase the BP  # Acute hypoxic respiratory failure due to fluid overload, acute systolic CHF and pneumonia:  - optimize volume status with HD - on antibiotics per PCCM.  #Hyponatremia:  - hypervolemic  - historically unable to ultrafiltrate much because of persistent hypotension.   - Please limit fluids - confirmed on 1.2 liter fluid restriction.  # Anemia of ESRD:    - Received PRBC - iron  saturation 8%, high ferritin due to inflammation.  S/p IV iron   - Hb 11 - will pause aranesp  (was 60mcg every thurs) and anticipate starting back at lower dose  #Secondary HPTH:   - phos ok but lower than goal - reduced sevelamer  again to 800 mg TID with meals (was on 3200 mg TID as of yesterday AM) - pause cinacalcet  for hypocalcemia and consider resuming at reduced dose   Disposition - in ICU    Subjective:   Last HD on 12/29 with 3 kg UF.  No UOP over 12/29 charted.  Had Vtach during last HD  treatment.  Team has added strattera . Levo is on at 2 mcg/min.  He would like a bath today if possible   Review of systems:    Denies shortness of breath or chest pain  No n/v - resolved Denies dizziness or cramping   Objective Vital signs in last 24 hours: Vitals:   11/15/24 0300 11/15/24 0400 11/15/24 0500 11/15/24 0600  BP:      Pulse: 85 83 85 83  Resp: (!) 30 12 14 20   Temp:      TempSrc:      SpO2: 99% 96% 98% 90%  Weight:   119.8 kg   Height:       Weight change: 0 kg  Intake/Output Summary (Last 24 hours) at 11/15/2024 0743 Last data filed at 11/15/2024 0600 Gross per 24 hour  Intake 458.43 ml  Output 3000 ml  Net -2541.57 ml       Labs: RENAL PANEL Recent Labs  Lab 11/11/24 0329 11/11/24 0330 11/12/24 0400 11/13/24 0300 11/14/24 0300 11/15/24 0456 11/15/24 0457  NA 129*  --  126* 126* 126*  --  126*  K 4.4  --  4.4 4.5 4.8  --  4.4  CL 95*  --  92* 92* 92*  --  91*  CO2 22  --  22 22 21*  --  20*  GLUCOSE 69*  --  208* 179* 170*  --  155*  BUN 29*  --  26* 34* 41*  --  31*  CREATININE 5.56*  --  4.72* 5.51* 6.37*  --  5.46*  CALCIUM  7.5*  --  7.4* 7.4* 7.4*  --  7.7*  MG  --  2.0 1.9 2.0 2.0 2.0  --   PHOS 3.5  --  2.6 2.8 3.0  --  2.8  ALBUMIN  3.0*  --  3.0* 3.0* 3.0*  --  3.0*    Liver Function Tests: Recent Labs  Lab 11/13/24 0300 11/14/24 0300 11/15/24 0457  ALBUMIN  3.0* 3.0* 3.0*   No results for input(s): LIPASE, AMYLASE in the last 168 hours. No results for input(s): AMMONIA in the last 168 hours. CBC: Recent Labs    11/16/23 1518 11/17/23 0245 11/24/23 1306 11/25/23 1108 11/30/23 1351 12/01/23 0246 11/09/24 1036 11/10/24 0625 11/12/24 0400 11/13/24 0300 11/14/24 0300 11/15/24 0456  HGB  --    < >  --    < >  --    < >  --  10.9* 10.5* 10.3* 10.9* 11.0*  MCV  --    < >  --    < >  --    < >  --  78.7* 77.0* 77.4* 76.6* 76.4*  VITAMINB12 2,058*  --   --   --   --   --  1,949*  --   --   --   --   --   FOLATE  16.6  --   --   --   --   --  12.7  --   --   --   --   --   FERRITIN  --   --  421*  --  342*  --  162  --   --   --   --   --   TIBC  --   --  294  --  322  --  232*  --   --   --   --   --   IRON   --   --  46  --  30*  --  18*  --   --   --   --   --   RETICCTPCT  --   --   --   --   --   --  2.2  --   --   --   --   --    < > = values in this interval not displayed.    Cardiac Enzymes: No results for input(s): CKTOTAL, CKMB, CKMBINDEX, TROPONINI in the last 168 hours. CBG: Recent Labs  Lab 11/14/24 1531 11/14/24 1532 11/14/24 1604 11/14/24 1631 11/14/24 2311  GLUCAP 51* 53* 57* 152* 138*    Iron  Studies:  No results for input(s): IRON , TIBC, TRANSFERRIN, FERRITIN in the last 72 hours.  Studies/Results: No results found.   Medications: Infusions:  anticoagulant sodium citrate      anticoagulant sodium citrate      norepinephrine  (LEVOPHED ) Adult infusion 2 mcg/min (11/15/24 0150)   [START ON 11/16/2024] vancomycin       Scheduled Medications:  aspirin  EC  81 mg Oral Q breakfast   atomoxetine   10 mg Oral BID   benzonatate   200 mg Oral TID   Chlorhexidine  Gluconate Cloth  6 each Topical Q0600   darbepoetin (ARANESP ) injection - DIALYSIS  60 mcg  Subcutaneous Q Thu-1800   feeding supplement  237 mL Oral Q24H   fludrocortisone   0.1 mg Oral Daily   guaiFENesin   600 mg Oral BID   heparin   5,000 Units Subcutaneous Q8H   insulin  aspart  0-15 Units Subcutaneous TID WC   insulin  aspart  4 Units Subcutaneous TID WC   insulin  glargine  8 Units Subcutaneous Daily   midodrine   20 mg Oral Q8H   multivitamin  1 tablet Oral QHS   pantoprazole   40 mg Oral Daily   sevelamer  carbonate  2,400 mg Oral TID WC    have reviewed scheduled and prn medications.  Physical Exam:    General adult male in bed in no acute distress HEENT normocephalic atraumatic extraocular movements intact sclera anicteric Neck supple trachea midline Lungs clear to auscultation bilaterally  normal work of breathing at rest on room air Heart S1S2 no rub Abdomen soft nontender obese habitus Extremities right BKA; 2-3+ edema  Psych normal mood and affect Neuro awake and conversant Access left femoral catheter nontunneled    Katheryn BROCKS Jartavious Mckimmy 11/15/2024,8:03 AM  LOS: 22 days

## 2024-11-15 NOTE — Progress Notes (Signed)
 Physical Therapy Treatment Patient Details Name: [name] HOVE Sr. MRN: [id number] DOB: [birth year] Today's Date: 11/15/2024   History of Present Illness [name] BUSHEY Sr. is a 63 y.o. male who presented to [facility] ED on 10/24/2024 with dyspnea x roughly 2-3 weeks. Pt placed on CRRT for volume overload, off 12/22. 12/25. Returned to ICU for hypotension. PMH: ESRD on HD MWF, A-fib, systolic CHF, hypertension, LBBB, DM.    PT Comments  Assisted to edge of bed where he tolerated majority of session working on seated balance and LE/trunk exercises/ROM. Reports feeling better getting off his back. Declines transfer to recliner at this time; encouraged to try sitting up in bed in egress mode. Required mod assist to roll and max assist for transitions to and from sitting position. Patient will continue to benefit from skilled physical therapy services to further improve independence with functional mobility. MAP in low to mid 70s while sitting, SpO2 upper 90s on 3.5L throughout.    If plan is discharge home, recommend the following: Assist for transportation;Help with stairs or ramp for entrance;Two people to help with walking and/or transfers;Two people to help with bathing/dressing/bathroom;Assistance with cooking/housework;Supervision due to cognitive status   Can travel by private vehicle     No  Equipment Recommendations  Wheelchair (measurements PT);Wheelchair cushion (measurements PT);Hoyer lift    Recommendations for Other Services       Precautions / Restrictions Precautions Precautions: Fall;Other (comment) Precaution/Restrictions Comments: L femoral line; R BKA, has prosthesis Required Braces or Orthoses: Other Brace Restrictions Weight Bearing Restrictions Per Provider Order: No Other Position/Activity Restrictions: L femoral line     Mobility  Bed Mobility Overal bed mobility: Needs Assistance Bed Mobility: Rolling, Sidelying to Sit, Sit to Sidelying Rolling: Mod assist,  Used rails Sidelying to sit: Max assist, HOB elevated, Used rails     Sit to sidelying: Max assist General bed mobility comments: Mod assist to roll with cues for rail use. Max assist to transition trunk to seated vertical position, some help for LEs off bed as well. Cues throughout, holding on to rail and therapist well as I assisted. Max assist for LEs and trunk back into bed.    Transfers                   General transfer comment: Declines    Ambulation/Gait               General Gait Details: pt non-ambulatory at baseline   Stairs             Wheelchair Mobility     Tilt Bed    Modified Rankin (Stroke Patients Only)       Balance Overall balance assessment: Needs assistance Sitting-balance support: No upper extremity supported, Feet supported Sitting balance-Leahy Scale: Fair Sitting balance - Comments: Close supervision EOB                                    Communication Communication Communication: No apparent difficulties;Other (comment)  Cognition Arousal: Alert Behavior During Therapy: Flat affect   PT - Cognitive impairments: No family/caregiver present to determine baseline                         Following commands: Impaired Following commands impaired: Only follows one step commands consistently    Cueing Cueing Techniques: Verbal cues, Tactile cues, Gestural cues  Exercises General  Exercises - Lower Extremity Ankle Circles/Pumps: AAROM, Left, 10 reps, Seated (with LEs elevated) Long Arc Quad: Strengthening, Both, 10 reps, Seated Hip Flexion/Marching: AROM, Seated, Strengthening, Both, 5 reps Other Exercises Other Exercises: Seated balance with weight shift towards Lt and Rt and return to midline with CGA. Other Exercises: Scapular retraction x10 seated, trunk rotation x5 seated, shurgs x10 seated.    General Comments General comments (skin integrity, edema, etc.): MAP in low to mid 70s seated EOB.  Pt developed dizziness in supine, but resolved with HOB elevated at end of session. SpO2 upper 90s on 3.5L supplmental O2.      Pertinent Vitals/Pain Pain Assessment Pain Assessment: No/denies pain    Home Living                          Prior Function            PT Goals (current goals can now be found in the care plan section) Acute Rehab PT Goals Patient Stated Goal: get better PT Goal Formulation: With patient Time For Goal Achievement: 11/23/24 Potential to Achieve Goals: Good Progress towards PT goals: Progressing toward goals    Frequency    Min 2X/week      PT Plan      Co-evaluation              AM-PAC PT 6 Clicks Mobility   Outcome Measure  Help needed turning from your back to your side while in a flat bed without using bedrails?: A Lot Help needed moving from lying on your back to sitting on the side of a flat bed without using bedrails?: A Lot Help needed moving to and from a bed to a chair (including a wheelchair)?: Total Help needed standing up from a chair using your arms (e.g., wheelchair or bedside chair)?: Total Help needed to walk in hospital room?: Total Help needed climbing 3-5 steps with a railing? : Total 6 Click Score: 8    End of Session Equipment Utilized During Treatment: Gait belt;Oxygen Activity Tolerance: Patient limited by fatigue Patient left: in bed;with call bell/phone within reach;with bed alarm set (RT entering room) Nurse Communication: Mobility status;Need for lift equipment PT Visit Diagnosis: Muscle weakness (generalized) (M62.81);Other abnormalities of gait and mobility (R26.89);Difficulty in walking, not elsewhere classified (R26.2);Other symptoms and signs involving the nervous system (R29.898)     Time: 8841-8778 PT Time Calculation (min) (ACUTE ONLY): 23 min  Charges:    $Therapeutic Exercise: 8-22 mins $Therapeutic Activity: 8-22 mins PT General Charges $$ ACUTE PT VISIT: 1 Visit                      [name] [name], PT, DPT [facility]  Rehabilitation Services Physical Therapist Office: [phone] Website: [email or web address]    [name] [name] 11/15/2024, 1:22 PM

## 2024-11-15 NOTE — Progress Notes (Signed)
 "  NAME:  [name] Trebilcock., MRN:  [id number], DOB:  03-22-1961, LOS: 22 ADMISSION DATE:  10/24/2024, CONSULTATION DATE:  12/17 REFERRING MD:  Ricky - [facility], CHIEF COMPLAINT:  hypotension  History of Present Illness:  [name] MALEK Sr. is a 63 y.o. male who has a PMH as outlined below including but not limited to ESRD on HD MWF, A-fib, systolic CHF, hypertension, LBBB, DM.  He presented to [facility] ED on 10/24/2024 with dyspnea x roughly 2-3 weeks. He had been seen in the ED a few days early on 12/2 for dysuria and was started on a course of Cipro  which she started the following day and stated that he had been taking as prescribed.     CT chest that demonstrated multifocal pneumonia and small effusions.  Blood cultures ultimately grew MRSA.  He had been on vancomycin  which was continued. He had a left thora 12/10 with 450cc fluid removed.  His HD catheter was removed on 10/28/2024 as well as an arterial line.  He did have a TTE on 12/11 that showed an EF of 20-25%, G2 DD, mild decrease in RV SF, severe AAS, no vegetations.  TEE was recommended but had been deferred due to patient requiring vasopressor support.  ID was consulted and recommended line holiday.  He had a temporary femoral HD cath placed on 12/15.  ID recommendations included 6 weeks of vancomycin  to end on 12/09/2024.    12/17, he was transferred to [facility] for ongoing management for septic shock as well as for IR evaluation for tunneled HD catheter placement  Pertinent  Medical History  ESRD on HD MWF, Afib, HFrEF, T2DM, LBBB, severe PVD s/p R BKA Past Medical History:  Diagnosis Date   ESRD on hemodialysis (HCC)    a. HD MWF > 10 years.   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 01/2022 Echo: EF 45-50%, glob HK, mod LVH, mildly reduced RV fxn, mild MR, mod-sev MV Ca2+. Mild AS; b. 10/2023 Echo: EF 45-50%, glob HK, GrII DD, mildly reduced RV fxn, RVSP 52.38mmHg. Mild MR/MS, mod TR.   Hypertension    Nonischemic  cardiomyopathy (HCC)    a. 2019 Cath [facility]): Nonobstructive disease; b. 12/2019 Echo: EF 50%; c. 12/2019 MV (Tx w/u [facility]): Ant infarct w/ peri-infarct ischemia. EF 35%; c. 05/2021 MV (Tx w/u [facility]): Cor Ca2+, EF 53%, motion artifact, equiv study, likely low risk.   Nonobstructive coronary artery disease    a.  2019 Cath ([place] [facility]): Left main normal, LAD 30 proximal, RCA 30 distal, EF 25%.   Obesity    Peripheral arterial disease    Peripheral vascular disease    Type 2 diabetes mellitus (HCC)    a. 10/2023 - admission w/ HHS.   Significant Hospital Events: Including procedures, antibiotic start and stop dates in addition to other pertinent events   12/8 admit 12/10 L thora with 450cc removed, transudate, neg cultures 12/11 echo 12/12 HD cath and art line removed 12/15 new femoral HD cath placed 12/17 transfer to [facility] 33M 12/19 HD  12/20 CRRT 12/23 Weaned off levophed  this am. HD trial this am. Transferred to [facility] 12/25 Returned to ICU for hypotension 12/28-remains on Levophed  12/29-remains on Levophed , tolerating dialysis  Interim History / Subjective:  No overnight events Intermittent cough On midodrine , Levophed , fludrocortisone  Awake alert interactive  Objective    Blood pressure 125/64, pulse 72, temperature 97.6 F (36.4 C), temperature source Oral, resp. rate 19,  height 6' 1 (1.854 m), weight 119.8 kg, SpO2 96%.        Intake/Output Summary (Last 24 hours) at 11/15/2024 1050 Last data filed at 11/15/2024 0900 Gross per 24 hour  Intake 214.64 ml  Output 3000 ml  Net -2785.36 ml   Filed Weights   11/14/24 0856 11/14/24 1310 11/15/24 0500  Weight: 123.4 kg 120.4 kg 119.8 kg   Physical Exam: General: Middle-age, does not appear to be in distress HENT: Moist oral mucosa Eyes: Anicteric Respiratory: Clear breath sounds Cardiovascular: S1-S2 appreciated with no murmur GI: Soft abdomen, bowel sounds appreciated Extremities: Right below-knee  amputation, 2+ pitting edema in left lower extremity neuro: Awake alert and interactive Psych: Normal affect  Lab data reviewed - On renal dose vancomycin    Resolved problem list   Assessment and Plan   Multifocal pneumonia Acute hypoxemic respiratory failure - Remains on room air - As needed bronchodilators  Hypotension - Only on a low-dose of Levophed  -Added Strattera  10 twice daily on 12/29 -Remains on midodrine , fludrocortisone  Will turn off Levophed  monitor his mental status  Heart failure with reduced ejection fraction of 20 to 25% Severe aortic stenosis -GDMT is limited in the setting of end-stage renal disease, on pressor -Continue volume removal per nephrology  Septic shock secondary to MRSA bacteremia/pneumonia - Completed 5 days of ceftriaxone  -Goal is to complete vancomycin  12/09/2024 - HD catheter removal with replacement of temporary on 12/15 - Will need permanent HD cath placement by IR when he is more stable  Chronic hypotension - Will wean off Levophed   End-stage renal disease on hemodialysis - Continue Cinacalcet  and sevelamer   Type 2 diabetes - Continue SSI Continue Lantus   Peripheral vascular disease - Continue aspirin   DVT prophylaxis  The patient is critically ill with multiple organ systems failure and requires high complexity decision making for assessment and support, frequent evaluation and titration of therapies, application of advanced monitoring technologies and extensive interpretation of multiple databases. Critical Care Time devoted to patient care services described in this note independent of APP/resident time (if applicable)  is 30 minutes.   Jennet Epley MD [facility] Personal pager: See Amion If unanswered, please page CCM On-call: #[phone] "

## 2024-11-15 NOTE — Plan of Care (Signed)

## 2024-11-16 DIAGNOSIS — J188 Other pneumonia, unspecified organism: Secondary | ICD-10-CM | POA: Diagnosis not present

## 2024-11-16 DIAGNOSIS — E119 Type 2 diabetes mellitus without complications: Secondary | ICD-10-CM | POA: Diagnosis not present

## 2024-11-16 DIAGNOSIS — I739 Peripheral vascular disease, unspecified: Secondary | ICD-10-CM | POA: Diagnosis not present

## 2024-11-16 DIAGNOSIS — Z515 Encounter for palliative care: Secondary | ICD-10-CM | POA: Diagnosis not present

## 2024-11-16 DIAGNOSIS — I5020 Unspecified systolic (congestive) heart failure: Secondary | ICD-10-CM | POA: Diagnosis not present

## 2024-11-16 DIAGNOSIS — R6521 Severe sepsis with septic shock: Secondary | ICD-10-CM | POA: Diagnosis not present

## 2024-11-16 DIAGNOSIS — E871 Hypo-osmolality and hyponatremia: Secondary | ICD-10-CM | POA: Diagnosis not present

## 2024-11-16 DIAGNOSIS — D649 Anemia, unspecified: Secondary | ICD-10-CM | POA: Diagnosis not present

## 2024-11-16 DIAGNOSIS — A4102 Sepsis due to Methicillin resistant Staphylococcus aureus: Secondary | ICD-10-CM | POA: Diagnosis not present

## 2024-11-16 DIAGNOSIS — J9601 Acute respiratory failure with hypoxia: Secondary | ICD-10-CM | POA: Diagnosis not present

## 2024-11-16 DIAGNOSIS — I9589 Other hypotension: Secondary | ICD-10-CM | POA: Diagnosis not present

## 2024-11-16 DIAGNOSIS — Z7189 Other specified counseling: Secondary | ICD-10-CM | POA: Diagnosis not present

## 2024-11-16 DIAGNOSIS — I132 Hypertensive heart and chronic kidney disease with heart failure and with stage 5 chronic kidney disease, or end stage renal disease: Secondary | ICD-10-CM | POA: Diagnosis not present

## 2024-11-16 DIAGNOSIS — N186 End stage renal disease: Secondary | ICD-10-CM | POA: Diagnosis not present

## 2024-11-16 LAB — RENAL FUNCTION PANEL
Albumin: 2.8 g/dL — ABNORMAL LOW (ref 3.5–5.0)
Anion gap: 13 (ref 5–15)
BUN: 36 mg/dL — ABNORMAL HIGH (ref 8–23)
CO2: 22 mmol/L (ref 22–32)
Calcium: 7.6 mg/dL — ABNORMAL LOW (ref 8.9–10.3)
Chloride: 93 mmol/L — ABNORMAL LOW (ref 98–111)
Creatinine, Ser: 6.07 mg/dL — ABNORMAL HIGH (ref 0.61–1.24)
GFR, Estimated: 10 mL/min — ABNORMAL LOW
Glucose, Bld: 83 mg/dL (ref 70–99)
Phosphorus: 3.1 mg/dL (ref 2.5–4.6)
Potassium: 4.4 mmol/L (ref 3.5–5.1)
Sodium: 127 mmol/L — ABNORMAL LOW (ref 135–145)

## 2024-11-16 LAB — CBC
HCT: 32.5 % — ABNORMAL LOW (ref 39.0–52.0)
Hemoglobin: 10.8 g/dL — ABNORMAL LOW (ref 13.0–17.0)
MCH: 25.9 pg — ABNORMAL LOW (ref 26.0–34.0)
MCHC: 33.2 g/dL (ref 30.0–36.0)
MCV: 77.9 fL — ABNORMAL LOW (ref 80.0–100.0)
Platelets: 231 K/uL (ref 150–400)
RBC: 4.17 MIL/uL — ABNORMAL LOW (ref 4.22–5.81)
RDW: 25.4 % — ABNORMAL HIGH (ref 11.5–15.5)
WBC: 7.4 K/uL (ref 4.0–10.5)
nRBC: 0 % (ref 0.0–0.2)

## 2024-11-16 LAB — GLUCOSE, CAPILLARY
Glucose-Capillary: 86 mg/dL (ref 70–99)
Glucose-Capillary: 116 mg/dL — ABNORMAL HIGH (ref 70–99)
Glucose-Capillary: 113 mg/dL — ABNORMAL HIGH (ref 70–99)
Glucose-Capillary: 106 mg/dL — ABNORMAL HIGH (ref 70–99)
Glucose-Capillary: 95 mg/dL (ref 70–99)

## 2024-11-16 LAB — MAGNESIUM: Magnesium: 2 mg/dL (ref 1.7–2.4)

## 2024-11-16 LAB — APTT: aPTT: 40 s — ABNORMAL HIGH (ref 24–36)

## 2024-11-16 MED ORDER — ALBUMIN HUMAN 25 % IV SOLN
INTRAVENOUS | Status: AC
  Filled 2024-11-16: qty 100

## 2024-11-16 MED ORDER — LIDOCAINE-PRILOCAINE 2.5-2.5 % EX CREA: 1.0000 | TOPICAL_CREAM | CUTANEOUS | Status: DC | PRN

## 2024-11-16 MED ORDER — ANTICOAGULANT SODIUM CITRATE 4% (200MG/5ML) IV SOLN: 5.0000 mL | Status: DC | PRN

## 2024-11-16 MED ORDER — INSULIN ASPART 100 UNIT/ML IJ SOLN
3.0000 [IU] | Freq: Three times a day (TID) | INTRAMUSCULAR | Status: DC
  Administered 2024-11-16 – 2024-11-19 (×8): 3 [IU] via SUBCUTANEOUS
  Filled 2024-11-16 (×8): qty 3

## 2024-11-16 MED ORDER — INSULIN GLARGINE 100 UNIT/ML ~~LOC~~ SOLN
5.0000 [IU] | Freq: Every day | SUBCUTANEOUS | Status: DC
  Administered 2024-11-17 – 2024-11-18 (×2): 5 [IU] via SUBCUTANEOUS
  Filled 2024-11-16 (×3): qty 0.05

## 2024-11-16 MED ORDER — HEPARIN SODIUM (PORCINE) 1000 UNIT/ML IJ SOLN
INTRAMUSCULAR | Status: AC
  Filled 2024-11-16: qty 8

## 2024-11-16 MED ORDER — HEPARIN SODIUM (PORCINE) 1000 UNIT/ML DIALYSIS
1000.0000 [IU] | INTRAMUSCULAR | Status: DC | PRN
  Administered 2024-11-16 (×4): 1000 [IU]
  Filled 2024-11-16 (×4): qty 1

## 2024-11-16 MED ORDER — LIDOCAINE HCL (PF) 1 % IJ SOLN: 5.0000 mL | INTRAMUSCULAR | Status: DC | PRN

## 2024-11-16 MED ORDER — ALBUMIN HUMAN 25 % IV SOLN
25.0000 g | Freq: Once | INTRAVENOUS | Status: AC
  Administered 2024-11-16: 25 g via INTRAVENOUS

## 2024-11-16 MED ORDER — PENTAFLUOROPROP-TETRAFLUOROETH EX AERO: 1.0000 | INHALATION_SPRAY | CUTANEOUS | Status: DC | PRN

## 2024-11-16 NOTE — Progress Notes (Signed)
 [facility] NEPHROLOGY PROGRESS NOTE  Assessment/ Plan:   # ESRD:  - s/p CRRT vol overload w/ shock 12/20-12/22.  Then back on iHD but not tolerating well due to hypotension  - HD on MWF schedule here.  They are attempting HD today off of levo but may ultimately need to add back.  Spoke with ICU MD - Dialysis has been challenging because of persistent hypotension.  May not be a candidate for outpatient dialysis due to same.  Noted palliative care team is following.  Would recommend continued goals of care discussions  # Septic/cardiogenic shock due to MRSA bacteremia:  - Abx per primary team - on vanc - pressors per primary team - levo is off currently  - [facility] was removed, status post temporary HD line.  IR consulted for new line - plan is to get a new tunneled catheter once off pressors and out of ICU. - Blood cultures 11/03/24 negative   # Hypotension/shock:  - On midodrine  20 mg 3 times daily - levo is currently off but as above may require addition back   - Fludrocortisone  was added by ICU team as well as strattera .  Spoke with pharmacy and clarified that the strattera  is to attempt to increase the BP  # Acute hypoxic respiratory failure due to fluid overload, acute systolic CHF and pneumonia:  - optimize volume status with HD - on antibiotics per PCCM.  # Hyponatremia:  - hypervolemic  - historically unable to UF much because of persistent hypotension.   - Please limit fluids - confirmed on 1.2 liter fluid restriction.  # Anemia of ESRD:    - Received PRBC - iron  saturation 8%, high ferritin due to inflammation.  S/p IV iron   - Hb 10.8 - after peaked at 11 we paused aranesp  (was 60mcg every thurs).  Anticipate starting back soon at lower dose  #Secondary HPTH:   - phos ok but lower than goal - reduced sevelamer  again to 800 mg TID with meals (was on 3200 mg TID as of 12/29 AM) - paused cinacalcet  for hypocalcemia and consider resuming at reduced dose    Disposition - in ICU     Subjective:   Last HD on 12/29 with 3 kg UF; vtach during last HD treatment.  No UOP over 12/30 charted.  Levo is off however BP is 80's systolic and MAP 65 on my arrival - HD is being set up but not running.  Spoke with ICU MD   Review of systems:     Denies shortness of breath or chest pain  No n/v - resolved Denies dizziness or cramping   Objective Vital signs in last 24 hours: Vitals:   11/16/24 0615 11/16/24 0630 11/16/24 0645 11/16/24 0700  BP:      Pulse: 81 80 77 76  Resp: 12 (!) 21 14 (!) 24  Temp:      TempSrc:      SpO2: 100% 98% 97% 98%  Weight:      Height:       Weight change: -3.4 kg  Intake/Output Summary (Last 24 hours) at 11/16/2024 0731 Last data filed at 11/16/2024 0700 Gross per 24 hour  Intake 335.6 ml  Output --  Net 335.6 ml       Labs: RENAL PANEL Recent Labs  Lab 11/12/24 0400 11/13/24 0300 11/14/24 0300 11/15/24 0456 11/15/24 0457 11/16/24 0436  NA 126* 126* 126*  --  126* 127*  K 4.4 4.5 4.8  --  4.4  4.4  CL 92* 92* 92*  --  91* 93*  CO2 22 22 21*  --  20* 22  GLUCOSE 208* 179* 170*  --  155* 83  BUN 26* 34* 41*  --  31* 36*  CREATININE 4.72* 5.51* 6.37*  --  5.46* 6.07*  CALCIUM  7.4* 7.4* 7.4*  --  7.7* 7.6*  MG 1.9 2.0 2.0 2.0  --  2.0  PHOS 2.6 2.8 3.0  --  2.8 3.1  ALBUMIN  3.0* 3.0* 3.0*  --  3.0* 2.8*    Liver Function Tests: Recent Labs  Lab 11/14/24 0300 11/15/24 0457 11/16/24 0436  ALBUMIN  3.0* 3.0* 2.8*   No results for input(s): LIPASE, AMYLASE in the last 168 hours. No results for input(s): AMMONIA in the last 168 hours. CBC: Recent Labs    11/24/23 1306 11/25/23 1108 11/30/23 1351 12/01/23 0246 11/09/24 1036 11/10/24 0625 11/12/24 0400 11/13/24 0300 11/14/24 0300 11/15/24 0456 11/16/24 0436  HGB  --    < >  --    < >  --    < > 10.5* 10.3* 10.9* 11.0* 10.8*  MCV  --    < >  --    < >  --    < > 77.0* 77.4* 76.6* 76.4* 77.9*  VITAMINB12  --   --   --   --   1,949*  --   --   --   --   --   --   FOLATE  --   --   --   --  12.7  --   --   --   --   --   --   FERRITIN 421*  --  342*  --  162  --   --   --   --   --   --   TIBC 294  --  322  --  232*  --   --   --   --   --   --   IRON  46  --  30*  --  18*  --   --   --   --   --   --   RETICCTPCT  --   --   --   --  2.2  --   --   --   --   --   --    < > = values in this interval not displayed.    Cardiac Enzymes: No results for input(s): CKTOTAL, CKMB, CKMBINDEX, TROPONINI in the last 168 hours. CBG: Recent Labs  Lab 11/14/24 2311 11/15/24 0758 11/15/24 1130 11/15/24 1532 11/15/24 2120  GLUCAP 138* 166* 178* 109* 90    Iron  Studies:  No results for input(s): IRON , TIBC, TRANSFERRIN, FERRITIN in the last 72 hours.  Studies/Results: No results found.   Medications: Infusions:  anticoagulant sodium citrate      norepinephrine  (LEVOPHED ) Adult infusion Stopped (11/16/24 9371)   vancomycin       Scheduled Medications:  aspirin  EC  81 mg Oral Q breakfast   atomoxetine   20 mg Oral BID   Chlorhexidine  Gluconate Cloth  6 each Topical Q0600   Chlorhexidine  Gluconate Cloth  6 each Topical Q0600   feeding supplement  237 mL Oral Q24H   fludrocortisone   0.1 mg Oral Daily   guaiFENesin   600 mg Oral BID   heparin   5,000 Units Subcutaneous Q8H   insulin  aspart  0-15 Units Subcutaneous TID WC   insulin  aspart  4 Units Subcutaneous TID WC  insulin  glargine  8 Units Subcutaneous Daily   midodrine   20 mg Oral Q8H   multivitamin  1 tablet Oral QHS   pantoprazole   40 mg Oral Daily   sevelamer  carbonate  800 mg Oral TID WC    have reviewed scheduled and prn medications.   Dialysis Orders: MWF [facility] 4h  B400  101.9kg  2K bath  TDC  Hep 6000 - Hectoral 5mcg IV q HD - No ESA/iron    Physical Exam:      General adult male in bed in no acute distress HEENT normocephalic atraumatic extraocular movements intact sclera anicteric Neck supple trachea midline Lungs clear  to auscultation bilaterally normal work of breathing at rest on room air Heart S1S2 no rub Abdomen soft nontender obese habitus Extremities right BKA; 2-3+ edema  Psych normal mood and affect Neuro awake and conversant Access left femoral catheter nontunneled    Katheryn BROCKS Germaine Shenker 11/16/2024,8:15 AM  LOS: 23 days

## 2024-11-16 NOTE — Progress Notes (Addendum)
 " Daily Progress Note   Date: 11/16/2024   Patient Name: [name] BASALDUA Sr.  DOB: 1961-04-18  MRN: [id number]  Age / Sex: 63 y.o., male  Attending Physician: Neda Jennet LABOR, MD Primary Care Physician: [name], MD Admit Date: 10/24/2024 Length of Stay: 23 days  Reason for Follow-up: Establishing goals of care  Past Medical History:  Diagnosis Date   ESRD on hemodialysis (HCC)    a. HD MWF > 10 years.   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 01/2022 Echo: EF 45-50%, glob HK, mod LVH, mildly reduced RV fxn, mild MR, mod-sev MV Ca2+. Mild AS; b. 10/2023 Echo: EF 45-50%, glob HK, GrII DD, mildly reduced RV fxn, RVSP 52.63mmHg. Mild MR/MS, mod TR.   Hypertension    Nonischemic cardiomyopathy (HCC)    a. 2019 Cath [facility]): Nonobstructive disease; b. 12/2019 Echo: EF 50%; c. 12/2019 MV (Tx w/u [facility]): Ant infarct w/ peri-infarct ischemia. EF 35%; c. 05/2021 MV (Tx w/u [facility]): Cor Ca2+, EF 53%, motion artifact, equiv study, likely low risk.   Nonobstructive coronary artery disease    a.  2019 Cath ([place] [facility]): Left main normal, LAD 30 proximal, RCA 30 distal, EF 25%.   Obesity    Peripheral arterial disease    Peripheral vascular disease    Type 2 diabetes mellitus (HCC)    a. 10/2023 - admission w/ HHS.    Assessment & Plan:   HPI/Patient Profile:   63 y.o. male  with past medical history of ESRD on HD, HFrEF (20-25% EF), T2DM, atrial fibrillation admitted on 10/24/2024 with MRSA bacteremia and AHRF 2/2 pneumonia. Per H&P on 10/24/2024 by Pearlean MD, patient presented to [facility] for 2-3 weeks of shortness of breath. CT chest on 10/24/2024 demonstrated multilobar bonchopneumonia. Blood cultures on 10/24/2024 positive for MRSA bacteremia. 10/26/2024 had left thoracentesis with 450 mL transudate fluid removed with negative cultures. Transferred to [facility] 11/02/2024 for management of septic shock and IR evaluation for [facility] placement which has not been  completed due to continued pressor needs.   Palliative medicine consulted for goals of care conversation.   SUMMARY OF RECOMMENDATIONS Full code, full scope Family reiterates that if patient significantly deteriorates they would consider comfort measures, but remain hopeful for improvement and rehab  Symptom Management:  Per primary team  Code Status: Full Code  Prognosis: Unable to determine  Discharge Planning: To Be Determined  Discussed with: Olalere MD 11/16/2024 about the patient's continued lack of progress and overall prognosis and difficulty raising his blood pressure to tolerate dialysis without levophed .   Subjective:   Subjective: Chart Reviewed. Updates received. Patient Assessed. Created space and opportunity for patient  and family to explore thoughts and feelings regarding current medical situation.  Today's Discussion:  Met with the patient at the bedside with daughter present. Reviewed our prior discussions and they want to continue full code and full scope at this time. The patient and family remain hopeful that he can participate in rehab and continue dialysis as an outpatient. Shared with them that his blood pressure is still very low and it is concerning even with recent attempts to provide PO medications that he can take outside of the hospital. Reiterated that at this point, patient is still not stable to leave ICU due to his low blood pressures and need for vasopressors in order to do HD.   Review of Systems  All other systems reviewed and are negative.   Objective:  Primary Diagnoses: Present on Admission:  Multifocal pneumonia  Below-knee amputation of right lower extremity (HCC)  Chronic systolic CHF (congestive heart failure) (HCC)  Type 2 diabetes mellitus with diabetic chronic kidney disease (HCC)  Severe sepsis with septic shock  MRSA bacteremia  Hypotension  CAD (coronary artery disease)  Peripheral vascular disease, unspecified   Vital  Signs:  BP 125/64   Pulse 76   Temp (!) 97.5 F (36.4 C) (Oral)   Resp (!) 22   Ht 6' 1 (1.854 m)   Wt 118 kg   SpO2 95%   BMI 34.32 kg/m   Physical Exam HENT:     Head: Normocephalic and atraumatic.  Eyes:     Extraocular Movements: Extraocular movements intact.  Cardiovascular:     Rate and Rhythm: Normal rate.  Pulmonary:     Effort: Pulmonary effort is normal.  Skin:    General: Skin is warm and dry.  Neurological:     General: No focal deficit present.     Mental Status: He is alert.  Psychiatric:     Comments: Flat affect    Palliative Assessment/Data: 40%   Existing Vynca/ACP Documentation: Daughter is HCPOA  Thank you for allowing us  to participate in the care of [name] KISSINGER Sr. PMT will continue to support holistically.  I personally spent a total of 35 minutes in the care of the patient today including preparing to see the patient, performing a medically appropriate exam/evaluation, counseling and educating, referring and communicating with other health care professionals, and documenting clinical information in the EHR.   Fairy FORBES Shan DEVONNA  Palliative Medicine Team  Team Phone # [phone] (Nights/Weekends) 11/16/2024 4:30 PM  "

## 2024-11-16 NOTE — Plan of Care (Signed)
  Problem: Pain Managment: Goal: General experience of comfort will improve and/or be controlled Outcome: Progressing   Problem: Safety: Goal: Ability to remain free from injury will improve Outcome: Progressing

## 2024-11-16 NOTE — Procedures (Signed)
 Post Hemodialysis Report:   Treatment Date:11/16/24  Net Removal: 2liters  Treatment Length: 3.5hours  Patient Access: Left Triple Lumen Femoral temporary CVC  Experienced asymptomatic hypotension per arterial line readings pre/intra dialysis treatment. Goal per primary team was to keep MAP>60 before starting Levophed .  25gm Albumin  dose was given at start of treatment per order. UF goal decreased when MAP started dipping down into 50s, patient tolerated well. Access positional, arterial pressures would increase when patient coughing, otherwise ran well at 400 BFR. Heparin  bolus given at start of treatment.    Post Vitals:  Temp:  97.6 Temp Source: Oral Heart Rate: 74 Resp: 23 B/P: 75/62(69) O2: 95% Room Air

## 2024-11-16 NOTE — Progress Notes (Signed)
 "  NAME:  [name] Liew., MRN:  [id number], DOB:  1961/10/25, LOS: 23 ADMISSION DATE:  10/24/2024, CONSULTATION DATE:  12/17 REFERRING MD:  Ricky - [facility], CHIEF COMPLAINT:  hypotension  History of Present Illness:  [name] RORIE Sr. is a 63 y.o. male who has a PMH as outlined below including but not limited to ESRD on HD MWF, A-fib, systolic CHF, hypertension, LBBB, DM.  He presented to [facility] ED on 10/24/2024 with dyspnea x roughly 2-3 weeks. He had been seen in the ED a few days early on 12/2 for dysuria and was started on a course of Cipro  which she started the following day and stated that he had been taking as prescribed.     CT chest that demonstrated multifocal pneumonia and small effusions.  Blood cultures ultimately grew MRSA.  He had been on vancomycin  which was continued. He had a left thora 12/10 with 450cc fluid removed.  His HD catheter was removed on 10/28/2024 as well as an arterial line.  He did have a TTE on 12/11 that showed an EF of 20-25%, G2 DD, mild decrease in RV SF, severe AAS, no vegetations.  TEE was recommended but had been deferred due to patient requiring vasopressor support.  ID was consulted and recommended line holiday.  He had a temporary femoral HD cath placed on 12/15.  ID recommendations included 6 weeks of vancomycin  to end on 12/09/2024.    12/17, he was transferred to [facility] for ongoing management for septic shock as well as for IR evaluation for tunneled HD catheter placement  Pertinent  Medical History  ESRD on HD MWF, Afib, HFrEF, T2DM, LBBB, severe PVD s/p R BKA Past Medical History:  Diagnosis Date   ESRD on hemodialysis (HCC)    a. HD MWF > 10 years.   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 01/2022 Echo: EF 45-50%, glob HK, mod LVH, mildly reduced RV fxn, mild MR, mod-sev MV Ca2+. Mild AS; b. 10/2023 Echo: EF 45-50%, glob HK, GrII DD, mildly reduced RV fxn, RVSP 52.16mmHg. Mild MR/MS, mod TR.   Hypertension    Nonischemic  cardiomyopathy (HCC)    a. 2019 Cath [facility]): Nonobstructive disease; b. 12/2019 Echo: EF 50%; c. 12/2019 MV (Tx w/u [facility]): Ant infarct w/ peri-infarct ischemia. EF 35%; c. 05/2021 MV (Tx w/u [facility]): Cor Ca2+, EF 53%, motion artifact, equiv study, likely low risk.   Nonobstructive coronary artery disease    a.  2019 Cath ([place] [facility]): Left main normal, LAD 30 proximal, RCA 30 distal, EF 25%.   Obesity    Peripheral arterial disease    Peripheral vascular disease    Type 2 diabetes mellitus (HCC)    a. 10/2023 - admission w/ HHS.   Significant Hospital Events: Including procedures, antibiotic start and stop dates in addition to other pertinent events   12/8 admit 12/10 L thora with 450cc removed, transudate, neg cultures 12/11 echo 12/12 HD cath and art line removed 12/15 new femoral HD cath placed 12/17 [facility] [name] 12/19 HD  12/20 CRRT 12/23 Weaned off levophed  this am. HD trial this am. Transferred to [facility] 12/25 Returned to ICU for hypotension 12/28-remains on Levophed  12/29-remains on Levophed , tolerating dialysis 12/31 was off Levophed  this morning  Interim History / Subjective:  No overnight events Was off Levophed  this morning Remains on midodrine , fludrocortisone , Strattera  to support his blood pressure  Objective    Blood pressure 125/64, pulse  76, temperature 97.6 F (36.4 C), temperature source Oral, resp. rate (!) 22, height 6' 1 (1.854 m), weight 118 kg, SpO2 95%.        Intake/Output Summary (Last 24 hours) at 11/16/2024 1258 Last data filed at 11/16/2024 1200 Gross per 24 hour  Intake 548.89 ml  Output 2000 ml  Net -1451.11 ml   Filed Weights   11/16/24 0600 11/16/24 0735 11/16/24 1200  Weight: 120 kg 120 kg 118 kg   Physical Exam: General: Middle-age, does not appear to be in distress HENT: Moist oral mucosa Eyes: Anicteric Respiratory: Clear breath sounds Cardiovascular: S1-S2 appreciated with no murmur GI: Soft, bowel sounds  appreciated Extremities: Right below-knee amputation, 2+ pitting edema in left lower extremity neuro: Awake alert and interactive Psych: Normal affect  Lab data reviewed - On renal dose vancomycin    I reviewed last 24 h vitals and pain scores, last 48 h intake and output, last 24 h labs and trends, and last 24 h imaging results.  Resolved problem list   Assessment and Plan   Multifocal pneumonia Hypoxemic respiratory failure - Continue bronchodilators - Patient remains on room air  Chronic hypotension - On midodrine , fludrocortisone , Strattera  at 20 mg twice daily  Heart failure with reduced ejection fraction of 20 to 25% Severe aortic stenosis - Volume removal per nephrology GDMT is limited  Septic shock secondary to MRSA bacteremia - Goal to complete vancomycin  12/09/2024 - Has a temporary dialysis catheter in place - This is to be replaced when he is more stable, will need to contact IR at the time  Chronic hypotension - Continue to try to wean off Levophed   End-stage renal disease on hemodialysis - Continue Cinacalcet  and sevelamer   Type 2 diabetes - Continue SSI  Peripheral vascular disease - On aspirin   The patient is critically ill with multiple organ systems failure and requires high complexity decision making for assessment and support, frequent evaluation and titration of therapies, application of advanced monitoring technologies and extensive interpretation of multiple databases. Critical Care Time devoted to patient care services described in this note independent of APP/resident time (if applicable)  is 31 minutes.   Jennet Epley MD  Pulmonary Critical Care Personal pager: See Amion If unanswered, please page CCM On-call: #[phone]  "

## 2024-11-16 NOTE — TOC Progression Note (Signed)
 Transition of Care (TOC) - Progression Note    Patient Details  Name: [name] DUNTON Sr. MRN: [id number] Date of Birth: 15-Nov-1961  Transition of Care [name]) CM/SW Contact  [name] [name], [name] [name] Phone Number: 11/16/2024, 3:37 PM  Clinical Narrative:     3:37 PM CSW followed up with patient at bedside on SNF decision. Patient stated his daughters were to review SNF options. CSW introduced self and role to patient's daughter, [name] [name]. [name] [name] informed CSW that patient told her that he is interested in discharging to [facility]. [name] [name] was agreeable with CSW send patient's referral to other SNFs in [facility] in case [facility] SNF is unable to offer a bed. CSW expanded SNF search and informed Renal Navigator of potential HD clinic transfer. CSW will continue to follow.  Expected Discharge Plan: Skilled Nursing Facility Barriers to Discharge: Continued Medical Work up, English As A Second Language Teacher, SNF Pending bed offer               Expected Discharge Plan and Services In-house Referral: Clinical Social Work   Post Acute Care Choice: Skilled Nursing Facility Living arrangements for the past 2 months: Single Family Home                                       Social Drivers of Health (SDOH) Interventions SDOH Screenings   Food Insecurity: No Food Insecurity (10/24/2024)  Housing: Low Risk (10/24/2024)  Transportation Needs: No Transportation Needs (10/24/2024)  Utilities: Not At Risk (10/24/2024)  Alcohol Screen: Low Risk (02/04/2022)  Depression (PHQ2-9): Low Risk (08/06/2022)  Tobacco Use: Low Risk (10/26/2024)    Readmission Risk Interventions    10/25/2024    1:31 PM 02/11/2024    9:09 PM 11/05/2022    3:08 PM  Readmission Risk Prevention Plan  Transportation Screening Complete Complete Complete  Medication Review Oceanographer) Complete Complete Complete  PCP or Specialist appointment within 3-5 days of discharge Not Complete Complete  Complete  HRI or Home Care Consult Complete Complete Complete  SW Recovery Care/Counseling Consult Complete  Complete  Palliative Care Screening Not Applicable Not Applicable Complete  Skilled Nursing Facility Not Complete Complete Complete

## 2024-11-16 NOTE — Progress Notes (Signed)
 Contacted by social work. Pt will likely need a clinic transfer as he attends out-pt HD in [place], but will likely d/c to snf in [place] area. Will continue to follow and refer out for clinic transfer once snf confirmed and closer to d/c.   Yesly Gerety Dialysis Navigator [phone]

## 2024-11-17 DIAGNOSIS — I5020 Unspecified systolic (congestive) heart failure: Secondary | ICD-10-CM | POA: Diagnosis not present

## 2024-11-17 DIAGNOSIS — I132 Hypertensive heart and chronic kidney disease with heart failure and with stage 5 chronic kidney disease, or end stage renal disease: Secondary | ICD-10-CM | POA: Diagnosis not present

## 2024-11-17 DIAGNOSIS — R6521 Severe sepsis with septic shock: Secondary | ICD-10-CM | POA: Diagnosis not present

## 2024-11-17 DIAGNOSIS — E119 Type 2 diabetes mellitus without complications: Secondary | ICD-10-CM | POA: Diagnosis not present

## 2024-11-17 DIAGNOSIS — I739 Peripheral vascular disease, unspecified: Secondary | ICD-10-CM | POA: Diagnosis not present

## 2024-11-17 DIAGNOSIS — I9589 Other hypotension: Secondary | ICD-10-CM | POA: Diagnosis not present

## 2024-11-17 DIAGNOSIS — J188 Other pneumonia, unspecified organism: Secondary | ICD-10-CM | POA: Diagnosis not present

## 2024-11-17 DIAGNOSIS — A4102 Sepsis due to Methicillin resistant Staphylococcus aureus: Secondary | ICD-10-CM | POA: Diagnosis not present

## 2024-11-17 DIAGNOSIS — E871 Hypo-osmolality and hyponatremia: Secondary | ICD-10-CM | POA: Diagnosis not present

## 2024-11-17 DIAGNOSIS — J9601 Acute respiratory failure with hypoxia: Secondary | ICD-10-CM | POA: Diagnosis not present

## 2024-11-17 DIAGNOSIS — D649 Anemia, unspecified: Secondary | ICD-10-CM | POA: Diagnosis not present

## 2024-11-17 DIAGNOSIS — N186 End stage renal disease: Secondary | ICD-10-CM | POA: Diagnosis not present

## 2024-11-17 LAB — MAGNESIUM: Magnesium: 2 mg/dL (ref 1.7–2.4)

## 2024-11-17 LAB — RENAL FUNCTION PANEL
Albumin: 3 g/dL — ABNORMAL LOW (ref 3.5–5.0)
Anion gap: 12 (ref 5–15)
BUN: 26 mg/dL — ABNORMAL HIGH (ref 8–23)
CO2: 24 mmol/L (ref 22–32)
Calcium: 8 mg/dL — ABNORMAL LOW (ref 8.9–10.3)
Chloride: 92 mmol/L — ABNORMAL LOW (ref 98–111)
Creatinine, Ser: 5 mg/dL — ABNORMAL HIGH (ref 0.61–1.24)
GFR, Estimated: 12 mL/min — ABNORMAL LOW
Glucose, Bld: 126 mg/dL — ABNORMAL HIGH (ref 70–99)
Phosphorus: 3 mg/dL (ref 2.5–4.6)
Potassium: 4.4 mmol/L (ref 3.5–5.1)
Sodium: 128 mmol/L — ABNORMAL LOW (ref 135–145)

## 2024-11-17 LAB — GLUCOSE, CAPILLARY
Glucose-Capillary: 109 mg/dL — ABNORMAL HIGH (ref 70–99)
Glucose-Capillary: 122 mg/dL — ABNORMAL HIGH (ref 70–99)
Glucose-Capillary: 122 mg/dL — ABNORMAL HIGH (ref 70–99)
Glucose-Capillary: 126 mg/dL — ABNORMAL HIGH (ref 70–99)
Glucose-Capillary: 156 mg/dL — ABNORMAL HIGH (ref 70–99)
Glucose-Capillary: 159 mg/dL — ABNORMAL HIGH (ref 70–99)

## 2024-11-17 MED ORDER — CHLORHEXIDINE GLUCONATE CLOTH 2 % EX PADS
6.0000 | MEDICATED_PAD | Freq: Every day | CUTANEOUS | Status: DC
  Administered 2024-11-18: 6 via TOPICAL

## 2024-11-17 NOTE — Progress Notes (Signed)
 [facility] NEPHROLOGY PROGRESS NOTE  Assessment/ Plan:   # ESRD:  - s/p CRRT vol overload w/ shock 12/20-12/22.  Then back on iHD but not tolerating well due to hypotension  - HD on MWF schedule here - Dialysis has been challenging because of persistent hypotension; he has required levophed .  May not be a candidate for outpatient dialysis due to same.  Noted palliative care team is following.  Would recommend continued goals of care discussions  # Septic/cardiogenic shock due to MRSA bacteremia:  - Abx per primary team - on vanc - pressors per primary team - [facility] was removed, status post temporary HD line.  IR consulted for new line - plan had been for a new tunneled catheter once off pressors and out of ICU.  At this point, would recommend tunneled catheter placement early next week with IR regardless of levo or ICU status - Blood cultures 11/03/24 negative   # Hypotension/shock:  - On midodrine  20 mg 3 times daily - levo per primary team  - Fludrocortisone  was added by ICU team as well as strattera .    # Acute hypoxic respiratory failure due to fluid overload, acute systolic CHF and pneumonia:  - optimize volume status with HD - on antibiotics per PCCM.  # Hyponatremia:  - hypervolemic  - historically unable to UF much because of persistent hypotension.   - Please limit fluids - confirmed on 1.2 liter fluid restriction.  # Anemia of ESRD:    - s/p PRBC - iron  saturation 8%, high ferritin due to inflammation.  S/p IV iron   - CBC in AM  - After Hb peaked at 11 we paused aranesp  (was 60mcg every thurs).  Anticipate starting back soon at lower dose  #Secondary HPTH:   - phos ok but lower than goal - reduced sevelamer  to 800 mg TID with meals (was on 3200 mg TID as of 12/29 AM and phos was low) - paused cinacalcet  for hypocalcemia. Reassess resuming at reduced dose in the future  Disposition - in ICU.  Is LTAC ICU an option?      Subjective:   Last HD on  12/31 with 2 kg UF.  No UOP over 12/31 charted.  He was back on the levo and required it for HD on 12/31.  Levo was turned off around 4 am this morning per nursing report.  We have discussed that he has continued to require levophed  with dialysis.    Review of systems:      Denies shortness of breath or chest pain  No n/v  Denies dizziness or cramping   Objective Vital signs in last 24 hours: Vitals:   11/17/24 0715 11/17/24 0730 11/17/24 0745 11/17/24 0800  Pulse: 78 77 82 82  Resp: 13 17 (!) 26 (!) 21  Temp:  97.9 F (36.6 C)    TempSrc:  Oral    SpO2: 98% 93% 93% 93%  Weight:      Height:       Weight change: 0 kg  Intake/Output Summary (Last 24 hours) at 11/17/2024 0823 Last data filed at 11/17/2024 0512 Gross per 24 hour  Intake 281.39 ml  Output 2000 ml  Net -1718.61 ml       Labs: RENAL PANEL Recent Labs  Lab 11/13/24 0300 11/14/24 0300 11/15/24 0456 11/15/24 0457 11/16/24 0436 11/17/24 0339  NA 126* 126*  --  126* 127* 128*  K 4.5 4.8  --  4.4 4.4 4.4  CL 92* 92*  --  91* 93* 92*  CO2 22 21*  --  20* 22 24  GLUCOSE 179* 170*  --  155* 83 126*  BUN 34* 41*  --  31* 36* 26*  CREATININE 5.51* 6.37*  --  5.46* 6.07* 5.00*  CALCIUM  7.4* 7.4*  --  7.7* 7.6* 8.0*  MG 2.0 2.0 2.0  --  2.0 2.0  PHOS 2.8 3.0  --  2.8 3.1 3.0  ALBUMIN  3.0* 3.0*  --  3.0* 2.8* 3.0*    Liver Function Tests: Recent Labs  Lab 11/15/24 0457 11/16/24 0436 11/17/24 0339  ALBUMIN  3.0* 2.8* 3.0*   No results for input(s): LIPASE, AMYLASE in the last 168 hours. No results for input(s): AMMONIA in the last 168 hours. CBC: Recent Labs    11/24/23 1306 11/25/23 1108 11/30/23 1351 12/01/23 0246 11/09/24 1036 11/10/24 0625 11/12/24 0400 11/13/24 0300 11/14/24 0300 11/15/24 0456 11/16/24 0436  HGB  --    < >  --    < >  --    < > 10.5* 10.3* 10.9* 11.0* 10.8*  MCV  --    < >  --    < >  --    < > 77.0* 77.4* 76.6* 76.4* 77.9*  VITAMINB12  --   --   --   --  1,949*  --    --   --   --   --   --   FOLATE  --   --   --   --  12.7  --   --   --   --   --   --   FERRITIN 421*  --  342*  --  162  --   --   --   --   --   --   TIBC 294  --  322  --  232*  --   --   --   --   --   --   IRON  46  --  30*  --  18*  --   --   --   --   --   --   RETICCTPCT  --   --   --   --  2.2  --   --   --   --   --   --    < > = values in this interval not displayed.    Cardiac Enzymes: No results for input(s): CKTOTAL, CKMB, CKMBINDEX, TROPONINI in the last 168 hours. CBG: Recent Labs  Lab 11/16/24 1136 11/16/24 1525 11/16/24 1941 11/16/24 2235 11/17/24 0725  GLUCAP 116* 113* 106* 95 156*    Iron  Studies:  No results for input(s): IRON , TIBC, TRANSFERRIN, FERRITIN in the last 72 hours.  Studies/Results: No results found.   Medications: Infusions:  anticoagulant sodium citrate      norepinephrine  (LEVOPHED ) Adult infusion Stopped (11/17/24 0338)   vancomycin  Stopped (11/16/24 1130)    Scheduled Medications:  aspirin  EC  81 mg Oral Q breakfast   atomoxetine   20 mg Oral BID   Chlorhexidine  Gluconate Cloth  6 each Topical Q0600   Chlorhexidine  Gluconate Cloth  6 each Topical Q0600   feeding supplement  237 mL Oral Q24H   fludrocortisone   0.1 mg Oral Daily   guaiFENesin   600 mg Oral BID   heparin   5,000 Units Subcutaneous Q8H   insulin  aspart  0-15 Units Subcutaneous TID WC   insulin  aspart  3 Units Subcutaneous TID WC   insulin  glargine  5 Units  Subcutaneous Daily   midodrine   20 mg Oral Q8H   multivitamin  1 tablet Oral QHS   pantoprazole   40 mg Oral Daily   sevelamer  carbonate  800 mg Oral TID WC    have reviewed scheduled and prn medications.   Dialysis Orders: MWF [facility] 4h  B400  101.9kg  2K bath  TDC  Hep 6000 - Hectoral 5mcg IV q HD - No ESA/iron    Physical Exam:       General adult male in bed in no acute distress HEENT normocephalic atraumatic extraocular movements intact sclera anicteric Neck supple trachea  midline Lungs clear to auscultation bilaterally normal work of breathing at rest on room air Heart S1S2 no rub Abdomen soft nontender obese habitus Extremities right BKA; 2-3+ edema  Psych normal mood and affect Neuro awake and conversant Access left femoral catheter nontunneled    [name] [name] Emmerich Cryer 11/17/2024,8:59 AM  LOS: 24 days

## 2024-11-17 NOTE — TOC Progression Note (Addendum)
 Transition of Care (TOC) - Progression Note    Patient Details  Name: [name] COAKLEY Sr. MRN: [id number] Date of Birth: [birth year]  Transition of Care [name]) CM/SW Contact  Luise JAYSON Pan, [place] Phone Number: 11/17/2024, 9:00 AM  Clinical Narrative:   CSW followed up with Kia, AD at [name], about patient referral. Per Kia, facility awaiting administrative approval on patient referral.   9:02 AM Per Kia, [name] can offer bed to patient. CSW to follow up with Renal Navigators after the holiday. CSW notified patients daughter, [name] [name].  CSW will continue to follow.    Expected Discharge Plan: Skilled Nursing Facility Barriers to Discharge: Continued Medical Work up, English As A Second Language Teacher, SNF Pending bed offer               Expected Discharge Plan and Services In-house Referral: Clinical Social Work   Post Acute Care Choice: Skilled Nursing Facility Living arrangements for the past 2 months: Single Family Home                                       Social Drivers of Health (SDOH) Interventions SDOH Screenings   Food Insecurity: No Food Insecurity (10/24/2024)  Housing: Low Risk (10/24/2024)  Transportation Needs: No Transportation Needs (10/24/2024)  Utilities: Not At Risk (10/24/2024)  Alcohol Screen: Low Risk (02/04/2022)  Depression (PHQ2-9): Low Risk (08/06/2022)  Tobacco Use: Low Risk (10/26/2024)    Readmission Risk Interventions    10/25/2024    1:31 PM 02/11/2024    9:09 PM 11/05/2022    3:08 PM  Readmission Risk Prevention Plan  Transportation Screening Complete Complete Complete  Medication Review Oceanographer) Complete Complete Complete  PCP or Specialist appointment within 3-5 days of discharge Not Complete Complete Complete  HRI or Home Care Consult Complete Complete Complete  SW Recovery Care/Counseling Consult Complete  Complete  Palliative Care Screening Not Applicable Not Applicable Complete  Skilled Nursing Facility Not Complete Complete  Complete

## 2024-11-17 NOTE — Plan of Care (Signed)
  Problem: Clinical Measurements: Goal: Will remain free from infection Outcome: Progressing Goal: Diagnostic test results will improve Outcome: Progressing Goal: Respiratory complications will improve Outcome: Progressing Goal: Cardiovascular complication will be avoided Outcome: Progressing   Problem: Activity: Goal: Risk for activity intolerance will decrease Outcome: Progressing   Problem: Nutrition: Goal: Adequate nutrition will be maintained Outcome: Progressing   Problem: Safety: Goal: Ability to remain free from injury will improve Outcome: Progressing   Problem: Skin Integrity: Goal: Risk for impaired skin integrity will decrease Outcome: Progressing   

## 2024-11-17 NOTE — Progress Notes (Signed)
 "  NAME:  [name] Dahlstrom., MRN:  [id number], DOB:  1961-03-19, LOS: 24 ADMISSION DATE:  10/24/2024, CONSULTATION DATE:  12/17 REFERRING MD:  Ricky - [facility], CHIEF COMPLAINT:  hypotension  History of Present Illness:  [name] EVERITT Sr. is a 64 y.o. male who has a PMH as outlined below including but not limited to ESRD on HD MWF, A-fib, systolic CHF, hypertension, LBBB, DM.  He presented to [facility] ED on 10/24/2024 with dyspnea x roughly 2-3 weeks. He had been seen in the ED a few days early on 12/2 for dysuria and was started on a course of Cipro  which she started the following day and stated that he had been taking as prescribed.     CT chest that demonstrated multifocal pneumonia and small effusions.  Blood cultures ultimately grew MRSA.  He had been on vancomycin  which was continued. He had a left thora 12/10 with 450cc fluid removed.  His HD catheter was removed on 10/28/2024 as well as an arterial line.  He did have a TTE on 12/11 that showed an EF of 20-25%, G2 DD, mild decrease in RV SF, severe AAS, no vegetations.  TEE was recommended but had been deferred due to patient requiring vasopressor support.  ID was consulted and recommended line holiday.  He had a temporary femoral HD cath placed on 12/15.  ID recommendations included 6 weeks of vancomycin  to end on 12/09/2024.    12/17, he was transferred to [facility] for ongoing management for septic shock as well as for IR evaluation for tunneled HD catheter placement  Pertinent  Medical History  ESRD on HD MWF, Afib, HFrEF, T2DM, LBBB, severe PVD s/p R BKA Past Medical History:  Diagnosis Date   ESRD on hemodialysis (HCC)    a. HD MWF > 10 years.   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 01/2022 Echo: EF 45-50%, glob HK, mod LVH, mildly reduced RV fxn, mild MR, mod-sev MV Ca2+. Mild AS; b. 10/2023 Echo: EF 45-50%, glob HK, GrII DD, mildly reduced RV fxn, RVSP 52.54mmHg. Mild MR/MS, mod TR.   Hypertension    Nonischemic  cardiomyopathy (HCC)    a. 2019 Cath [facility]): Nonobstructive disease; b. 12/2019 Echo: EF 50%; c. 12/2019 MV ([place] w/u [facility]): Ant infarct w/ peri-infarct ischemia. EF 35%; c. 05/2021 MV ([place] w/u [facility]): Cor Ca2+, EF 53%, motion artifact, equiv study, likely low risk.   Nonobstructive coronary artery disease    a.  2019 Cath ([place] [facility]): Left main normal, LAD 30 proximal, RCA 30 distal, EF 25%.   Obesity    Peripheral arterial disease    Peripheral vascular disease    Type 2 diabetes mellitus (HCC)    a. 10/2023 - admission w/ HHS.   Significant Hospital Events: Including procedures, antibiotic start and stop dates in addition to other pertinent events   12/8 admit 12/10 L thora with 450cc removed, transudate, neg cultures 12/11 echo 12/12 HD cath and art line removed 12/15 new femoral HD cath placed 12/17 transfer to [facility] 73M 12/19 HD  12/20 CRRT 12/23 Weaned off levophed  this am. HD trial this am. Transferred to [facility] 12/25 Returned to ICU for hypotension 12/28-remains on Levophed  12/29-remains on Levophed , tolerating dialysis 12/31 was off Levophed  this morning  Interim History / Subjective:  Off Levophed  Objective    Blood pressure 125/64, pulse 83, temperature 98.5 F (36.9 C), temperature source Oral, resp. rate (!) 26, height 6' 1 (1.854  m), weight 124.1 kg, SpO2 93%.        Intake/Output Summary (Last 24 hours) at 11/17/2024 1247 Last data filed at 11/17/2024 1200 Gross per 24 hour  Intake 414.42 ml  Output --  Net 414.42 ml   Filed Weights   11/16/24 0735 11/16/24 1200 11/17/24 0500  Weight: 120 kg 118 kg 124.1 kg   Physical Exam: General: Middle-age, does not appear to be in distress HENT: Moist oral mucosa Eyes: Anicteric Respiratory: Clear breath sounds bilaterally Cardiovascular: S1-S2 appreciated with no murmur GI: Soft, bowel sounds appreciated Extremities: Right below-knee amputation, 2+ pitting edema in left lower extremity neuro: Awake, alert  and oriented Psych: Normal affect  Lab data reviewed  On renal dose vancomycin   Resolved problem list   Assessment and Plan   Multifocal pneumonia Hypoxemic respiratory failure -Continue bronchodilators -Remains on room air  Chronic hypotension - On midodrine , fludrocortisone , Strattera  20 mg twice a day  Heart failure with reduced ejection fraction of 20 to 25% Severe aortic stenosis - Volume removal per nephrology  Septic shock secondary to MRSA bacteremia - Goal is to complete vancomycin  dosing on 12/09/2024 - Has temporary dialysis catheter in place - This has to be replaced when he is more stable, will need to contract contact IR at the time  Chronic hypotension - Leave of Levophed  as long as mental status continues to be fair  End-stage renal disease on hemodialysis - Continue Cinacalcet  and sevelamer   Type 2 diabetes - Continue SSI  Peripheral vascular disease - On aspirin   The patient is critically ill with multiple organ systems failure and requires high complexity decision making for assessment and support, frequent evaluation and titration of therapies, application of advanced monitoring technologies and extensive interpretation of multiple databases. Critical Care Time devoted to patient care services described in this note independent of APP/resident time (if applicable)  is 32 minutes.   [name] Epley MD [facility] Personal pager: See Amion If unanswered, please page CCM On-call: #[phone]  "

## 2024-11-17 DEATH — deceased

## 2024-11-18 DIAGNOSIS — E871 Hypo-osmolality and hyponatremia: Secondary | ICD-10-CM | POA: Diagnosis not present

## 2024-11-18 DIAGNOSIS — A4102 Sepsis due to Methicillin resistant Staphylococcus aureus: Secondary | ICD-10-CM | POA: Diagnosis not present

## 2024-11-18 DIAGNOSIS — Z515 Encounter for palliative care: Secondary | ICD-10-CM | POA: Diagnosis not present

## 2024-11-18 DIAGNOSIS — N186 End stage renal disease: Secondary | ICD-10-CM | POA: Diagnosis not present

## 2024-11-18 DIAGNOSIS — I9589 Other hypotension: Secondary | ICD-10-CM | POA: Diagnosis not present

## 2024-11-18 DIAGNOSIS — J188 Other pneumonia, unspecified organism: Secondary | ICD-10-CM | POA: Diagnosis not present

## 2024-11-18 DIAGNOSIS — I132 Hypertensive heart and chronic kidney disease with heart failure and with stage 5 chronic kidney disease, or end stage renal disease: Secondary | ICD-10-CM | POA: Diagnosis not present

## 2024-11-18 DIAGNOSIS — E119 Type 2 diabetes mellitus without complications: Secondary | ICD-10-CM | POA: Diagnosis not present

## 2024-11-18 DIAGNOSIS — J9601 Acute respiratory failure with hypoxia: Secondary | ICD-10-CM | POA: Diagnosis not present

## 2024-11-18 DIAGNOSIS — I5020 Unspecified systolic (congestive) heart failure: Secondary | ICD-10-CM | POA: Diagnosis not present

## 2024-11-18 DIAGNOSIS — I739 Peripheral vascular disease, unspecified: Secondary | ICD-10-CM | POA: Diagnosis not present

## 2024-11-18 DIAGNOSIS — R6521 Severe sepsis with septic shock: Secondary | ICD-10-CM | POA: Diagnosis not present

## 2024-11-18 DIAGNOSIS — D649 Anemia, unspecified: Secondary | ICD-10-CM | POA: Diagnosis not present

## 2024-11-18 LAB — CBC
HCT: 32.6 % — ABNORMAL LOW (ref 39.0–52.0)
Hemoglobin: 10.8 g/dL — ABNORMAL LOW (ref 13.0–17.0)
MCH: 26.1 pg (ref 26.0–34.0)
MCHC: 33.1 g/dL (ref 30.0–36.0)
MCV: 78.7 fL — ABNORMAL LOW (ref 80.0–100.0)
Platelets: 254 K/uL (ref 150–400)
RBC: 4.14 MIL/uL — ABNORMAL LOW (ref 4.22–5.81)
RDW: 25.6 % — ABNORMAL HIGH (ref 11.5–15.5)
WBC: 7.4 K/uL (ref 4.0–10.5)
nRBC: 0 % (ref 0.0–0.2)

## 2024-11-18 LAB — GLUCOSE, CAPILLARY
Glucose-Capillary: 179 mg/dL — ABNORMAL HIGH (ref 70–99)
Glucose-Capillary: 221 mg/dL — ABNORMAL HIGH (ref 70–99)
Glucose-Capillary: 177 mg/dL — ABNORMAL HIGH (ref 70–99)
Glucose-Capillary: 112 mg/dL — ABNORMAL HIGH (ref 70–99)
Glucose-Capillary: 162 mg/dL — ABNORMAL HIGH (ref 70–99)

## 2024-11-18 LAB — RENAL FUNCTION PANEL
Albumin: 2.7 g/dL — ABNORMAL LOW (ref 3.5–5.0)
Anion gap: 14 (ref 5–15)
BUN: 31 mg/dL — ABNORMAL HIGH (ref 8–23)
CO2: 20 mmol/L — ABNORMAL LOW (ref 22–32)
Calcium: 7.6 mg/dL — ABNORMAL LOW (ref 8.9–10.3)
Chloride: 92 mmol/L — ABNORMAL LOW (ref 98–111)
Creatinine, Ser: 5.8 mg/dL — ABNORMAL HIGH (ref 0.61–1.24)
GFR, Estimated: 10 mL/min — ABNORMAL LOW
Glucose, Bld: 177 mg/dL — ABNORMAL HIGH (ref 70–99)
Phosphorus: 3.4 mg/dL (ref 2.5–4.6)
Potassium: 4.8 mmol/L (ref 3.5–5.1)
Sodium: 127 mmol/L — ABNORMAL LOW (ref 135–145)

## 2024-11-18 LAB — MAGNESIUM: Magnesium: 1.9 mg/dL (ref 1.7–2.4)

## 2024-11-18 MED ORDER — ALBUMIN HUMAN 25 % IV SOLN
INTRAVENOUS | Status: AC
  Filled 2024-11-18: qty 100

## 2024-11-18 MED ORDER — PENTAFLUOROPROP-TETRAFLUOROETH EX AERO: 1.0000 | INHALATION_SPRAY | CUTANEOUS | Status: DC | PRN

## 2024-11-18 MED ORDER — ALBUMIN HUMAN 25 % IV SOLN
25.0000 g | Freq: Once | INTRAVENOUS | Status: AC | PRN
  Administered 2024-11-18: 25 g via INTRAVENOUS

## 2024-11-18 MED ORDER — ANTICOAGULANT SODIUM CITRATE 4% (200MG/5ML) IV SOLN: 5.0000 mL | Status: DC | PRN

## 2024-11-18 MED ORDER — CHLORHEXIDINE GLUCONATE CLOTH 2 % EX PADS
6.0000 | MEDICATED_PAD | Freq: Every day | CUTANEOUS | Status: DC
  Administered 2024-11-19 – 2024-11-25 (×5): 6 via TOPICAL

## 2024-11-18 MED ORDER — HEPARIN SODIUM (PORCINE) 1000 UNIT/ML DIALYSIS: 1000.0000 [IU] | INTRAMUSCULAR | Status: DC | PRN

## 2024-11-18 MED ORDER — LIDOCAINE-PRILOCAINE 2.5-2.5 % EX CREA: 1.0000 | TOPICAL_CREAM | CUTANEOUS | Status: DC | PRN

## 2024-11-18 MED ORDER — LIDOCAINE HCL (PF) 1 % IJ SOLN: 5.0000 mL | INTRAMUSCULAR | Status: DC | PRN

## 2024-11-18 MED ORDER — HEPARIN SODIUM (PORCINE) 1000 UNIT/ML IJ SOLN
INTRAMUSCULAR | Status: AC
  Filled 2024-11-18: qty 3

## 2024-11-18 MED ORDER — HEPARIN SODIUM (PORCINE) 1000 UNIT/ML IJ SOLN
2800.0000 [IU] | Freq: Once | INTRAMUSCULAR | Status: AC
  Administered 2024-11-18: 2800 [IU]

## 2024-11-18 NOTE — TOC Progression Note (Addendum)
 Transition of Care (TOC) - Progression Note    Patient Details  Name: [name] DELCARLO Sr. MRN: [id number] Date of Birth: [birth year]  Transition of Care [facility]) CM/SW Contact  Lauraine FORBES Saa, LCSWA Phone Number: 11/18/2024, 11:47 AM  Clinical Narrative:     11:48 AM Per progressions, patient remains on levophed  and is to receive new catheter for HD on Monday. CSW made Renal Navigator aware of outpatient HD transfer need. CSW will continue to follow.  Expected Discharge Plan: Skilled Nursing Facility Barriers to Discharge: Continued Medical Work up, English As A Second Language Teacher, SNF Pending bed offer               Expected Discharge Plan and Services In-house Referral: Clinical Social Work   Post Acute Care Choice: Skilled Nursing Facility Living arrangements for the past 2 months: Single Family Home                                       Social Drivers of Health (SDOH) Interventions SDOH Screenings   Food Insecurity: No Food Insecurity (10/24/2024)  Housing: Low Risk (10/24/2024)  Transportation Needs: No Transportation Needs (10/24/2024)  Utilities: Not At Risk (10/24/2024)  Alcohol Screen: Low Risk (02/04/2022)  Depression (PHQ2-9): Low Risk (08/06/2022)  Tobacco Use: Low Risk (10/26/2024)    Readmission Risk Interventions    10/25/2024    1:31 PM 02/11/2024    9:09 PM 11/05/2022    3:08 PM  Readmission Risk Prevention Plan  Transportation Screening Complete Complete Complete  Medication Review Oceanographer) Complete Complete Complete  PCP or Specialist appointment within 3-5 days of discharge Not Complete Complete Complete  HRI or Home Care Consult Complete Complete Complete  SW Recovery Care/Counseling Consult Complete  Complete  Palliative Care Screening Not Applicable Not Applicable Complete  Skilled Nursing Facility Not Complete Complete Complete

## 2024-11-18 NOTE — Progress Notes (Addendum)
 Noted that pt will be discharging likely to [facility] in [place] for short term rehab. Pt current with [facility] and will need short term clinic transfer. Will begin today and update when possible.   Lavanda Fredrickson Dialysis navigator [phone]  Addendum 1117am Referral for clinic transfer completed via portal. Will update once financially and medically cleared.

## 2024-11-18 NOTE — Progress Notes (Signed)
 [facility] NEPHROLOGY PROGRESS NOTE  Assessment/ Plan:   # ESRD:  - s/p CRRT vol overload w/ shock 12/20-12/22.  Then back on iHD but not tolerating well due to hypotension  - HD on MWF schedule here - Will plan for an additional HD on Sat, 1/3, to optimize through the weekend.  Support UF with levo of up to 10 mcg/in today and tomorrow.  Then will attempt HD off of levo next week when closer to euvolemic.  - Dialysis has been challenging because of persistent hypotension; he has required levophed .  Noted palliative care team is following.  Would recommend continued goals of care discussions  # Septic/cardiogenic shock due to MRSA bacteremia:  - Abx per primary team - on vanc - pressors per primary team - [facility] was removed, status post temporary HD line.  IR consulted for new line - plan had been for a new tunneled catheter once off pressors and out of ICU.  At this point, would recommend tunneled catheter placement early next week with IR regardless of levo or ICU status - Blood cultures 11/03/24 negative   # Hypotension/shock:  - On midodrine  20 mg 3 times daily - levo per primary team  - Fludrocortisone  was added by ICU team as well as strattera .    # Acute hypoxic respiratory failure due to fluid overload, acute systolic CHF and pneumonia:  - optimize volume status with HD - on antibiotics per PCCM.  # Hyponatremia:  - hypervolemic  - historically unable to UF much because of persistent hypotension.   - confirmed on 1.2 liter fluid restriction  # Anemia of ESRD:    - s/p PRBC - iron  saturation 8%, high ferritin due to inflammation.  S/p IV iron   - After Hb peaked at 11 we paused aranesp  (was 60mcg every thurs).  Anticipate starting back soon at lower dose  #Secondary HPTH:   - phos ok but lower than goal - we reduced sevelamer  to 800 mg TID with meals (was on 3200 mg TID as of 12/29 AM and phos was low) - paused cinacalcet  for hypocalcemia. Reassess resuming  at reduced dose in the future  Disposition - in ICU.  Is an LTAC ICU an option?      Subjective:   Last HD on 12/31 with 2 kg UF.  No UOP over 1/1 charted.  He states that he normally pulls 4-5 kg on HD outpatient.  Speaking with ICU & HD RN's and updated his HD orders for today.   Review of systems:       Denies shortness of breath or chest pain  No n/v  Denies dizziness or cramping   Objective Vital signs in last 24 hours: Vitals:   11/18/24 0500 11/18/24 0600 11/18/24 0700 11/18/24 0811  Pulse: 88 87 90   Resp: 18 (!) 22 (!) 23   Temp:    97.6 F (36.4 C)  TempSrc:    Axillary  SpO2: 95% 90% 90%   Weight:      Height:       Weight change: 3.3 kg  Intake/Output Summary (Last 24 hours) at 11/18/2024 0834 Last data filed at 11/18/2024 9366 Gross per 24 hour  Intake 1382.44 ml  Output --  Net 1382.44 ml       Labs: RENAL PANEL Recent Labs  Lab 11/14/24 0300 11/15/24 0456 11/15/24 0457 11/16/24 0436 11/17/24 0339 11/18/24 0500  NA 126*  --  126* 127* 128* 127*  K 4.8  --  4.4 4.4 4.4 4.8  CL 92*  --  91* 93* 92* 92*  CO2 21*  --  20* 22 24 20*  GLUCOSE 170*  --  155* 83 126* 177*  BUN 41*  --  31* 36* 26* 31*  CREATININE 6.37*  --  5.46* 6.07* 5.00* 5.80*  CALCIUM  7.4*  --  7.7* 7.6* 8.0* 7.6*  MG 2.0 2.0  --  2.0 2.0 1.9  PHOS 3.0  --  2.8 3.1 3.0 3.4  ALBUMIN  3.0*  --  3.0* 2.8* 3.0* 2.7*    Liver Function Tests: Recent Labs  Lab 11/16/24 0436 11/17/24 0339 11/18/24 0500  ALBUMIN  2.8* 3.0* 2.7*   No results for input(s): LIPASE, AMYLASE in the last 168 hours. No results for input(s): AMMONIA in the last 168 hours. CBC: Recent Labs    11/24/23 1306 11/25/23 1108 11/30/23 1351 12/01/23 0246 11/09/24 1036 11/10/24 0625 11/13/24 0300 11/14/24 0300 11/15/24 0456 11/16/24 0436 11/18/24 0500  HGB  --    < >  --    < >  --    < > 10.3* 10.9* 11.0* 10.8* 10.8*  MCV  --    < >  --    < >  --    < > 77.4* 76.6* 76.4* 77.9* 78.7*   VITAMINB12  --   --   --   --  1,949*  --   --   --   --   --   --   FOLATE  --   --   --   --  12.7  --   --   --   --   --   --   FERRITIN 421*  --  342*  --  162  --   --   --   --   --   --   TIBC 294  --  322  --  232*  --   --   --   --   --   --   IRON  46  --  30*  --  18*  --   --   --   --   --   --   RETICCTPCT  --   --   --   --  2.2  --   --   --   --   --   --    < > = values in this interval not displayed.    Cardiac Enzymes: No results for input(s): CKTOTAL, CKMB, CKMBINDEX, TROPONINI in the last 168 hours. CBG: Recent Labs  Lab 11/17/24 1714 11/17/24 1942 11/17/24 2341 11/18/24 0327 11/18/24 0751  GLUCAP 126* 109* 122* 179* 221*    Iron  Studies:  No results for input(s): IRON , TIBC, TRANSFERRIN, FERRITIN in the last 72 hours.  Studies/Results: No results found.   Medications: Infusions:  anticoagulant sodium citrate      norepinephrine  (LEVOPHED ) Adult infusion 1 mcg/min (11/18/24 9366)   vancomycin  Stopped (11/16/24 1130)    Scheduled Medications:  aspirin  EC  81 mg Oral Q breakfast   atomoxetine   20 mg Oral BID   Chlorhexidine  Gluconate Cloth  6 each Topical Q0600   feeding supplement  237 mL Oral Q24H   guaiFENesin   600 mg Oral BID   heparin   5,000 Units Subcutaneous Q8H   insulin  aspart  0-15 Units Subcutaneous TID WC   insulin  aspart  3 Units Subcutaneous TID WC   insulin  glargine  5 Units Subcutaneous Daily   midodrine   20 mg Oral Q8H   multivitamin  1 tablet Oral QHS   pantoprazole   40 mg Oral Daily   sevelamer  carbonate  800 mg Oral TID WC    have reviewed scheduled and prn medications.   Dialysis Orders: MWF [facility] 4h  B400  101.9kg  2K bath  TDC  Hep 6000 - Hectoral 5mcg IV q HD - No ESA/iron    Physical Exam:        General adult male in bed in no acute distress HEENT normocephalic atraumatic extraocular movements intact sclera anicteric Neck supple trachea midline Lungs clear to auscultation bilaterally  normal work of breathing at rest on room air Heart S1S2 no rub Abdomen soft nontender obese habitus Extremities right BKA; 2-3+ edema  Psych normal mood and affect Neuro awake and conversant Access left femoral catheter nontunneled    Katheryn BROCKS Tashena Ibach 11/18/2024,8:55 AM  LOS: 25 days

## 2024-11-18 NOTE — Progress Notes (Addendum)
 " Daily Progress Note   Date: 11/18/2024   Patient Name: [name] [name].  DOB: 04/19/1961  MRN: [id number]  Age / Sex: 64 y.o., male  Attending Physician: Neda Jennet LABOR, MD Primary Care Physician: [name], MD Admit Date: 10/24/2024 Length of Stay: 25 days  Reason for Follow-up: Establishing goals of care  Past Medical History:  Diagnosis Date   ESRD on hemodialysis (HCC)    a. HD MWF > 10 years.   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 01/2022 Echo: EF 45-50%, glob HK, mod LVH, mildly reduced RV fxn, mild MR, mod-sev MV Ca2+. Mild AS; b. 10/2023 Echo: EF 45-50%, glob HK, GrII DD, mildly reduced RV fxn, RVSP 52.74mmHg. Mild MR/MS, mod TR.   Hypertension    Nonischemic cardiomyopathy (HCC)    a. 2019 Cath [facility]): Nonobstructive disease; b. 12/2019 Echo: EF 50%; c. 12/2019 MV (Tx w/u [facility]): Ant infarct w/ peri-infarct ischemia. EF 35%; c. 05/2021 MV (Tx w/u [facility]): Cor Ca2+, EF 53%, motion artifact, equiv study, likely low risk.   Nonobstructive coronary artery disease    a.  2019 Cath ([facility]): Left main normal, LAD 30 proximal, RCA 30 distal, EF 25%.   Obesity    Peripheral arterial disease    Peripheral vascular disease    Type 2 diabetes mellitus (HCC)    a. 10/2023 - admission w/ HHS.    Assessment & Plan:   HPI/Patient Profile:   64 y.o. male  with past medical history of ESRD on HD, HFrEF (20-25% EF), T2DM, atrial fibrillation admitted on 10/24/2024 with MRSA bacteremia and AHRF 2/2 pneumonia. Per H&P on 10/24/2024 by Pearlean MD, patient presented to [facility] for 2-3 weeks of shortness of breath. CT chest on 10/24/2024 demonstrated multilobar bonchopneumonia. Blood cultures on 10/24/2024 positive for MRSA bacteremia. 10/26/2024 had left thoracentesis with 450 mL transudate fluid removed with negative cultures. Transferred to [facility] 11/02/2024 for management of septic shock and IR evaluation for [facility] placement which has not been completed  due to continued pressor needs.   Palliative medicine consulted for goals of care conversation.  SUMMARY OF RECOMMENDATIONS Full code, full scope Patient expresses desires to continue current interventions and hopeful to become stable enough for discharge to rehab  Symptom Management:  Per primary team  Code Status: Full Code  Prognosis: Unable to determine  Discharge Planning: To Be Determined  Discussed with: Zheng DO 11/18/2024 about conversation with the patient's daughter who continues to express understanding of the serious illness and persistent low blood pressure but wants to continue current management.   Subjective:   Subjective: Chart Reviewed. Updates received. Patient Assessed. Created space and opportunity for patient  and family to explore thoughts and feelings regarding current medical situation.  Patient currently on 1 mcg/min of norepinephrine .   Today's Discussion:  Met with the patient at bedside without any visitors. Reviewed medical conditions with patient stating that the medical team remains concerned about the patient's persistent hypotension despite attempts by CCMD to raise blood pressure by oral medications. Reviewed that the patient continues to need ICU level care due to vasopressors. Inquired about the patient's previous desire to pass naturally but patient says he is now motivated to continue and hopeful that he can recover. Shared with the patient that the medical team wishes that we could get him better so that he may be able to get out of the hospital, but we are worried that we might not be  able to get him there as his body might not be able to get there despite his wishes and the continued attempts of the medical team. Asked the patient what his thoughts are if the medical team cannot discharge him from the hospital and that he might have to remain in the hospital for a prolonged period of time and patient expressed that he was unsure about that. Patient  did not share anymore information about why he felt unsure about having a prolonged hospital stay. Inquired patient about hospice as that may be the only option left for him to get home and he says he has not thought about it and did not express desire to continue hospice conversations at this time.   Review of Systems  Constitutional:  Positive for fatigue.  All other systems reviewed and are negative.   Objective:   Primary Diagnoses: Present on Admission:  Multifocal pneumonia  Below-knee amputation of right lower extremity (HCC)  Chronic systolic CHF (congestive heart failure) (HCC)  Type 2 diabetes mellitus with diabetic chronic kidney disease (HCC)  Severe sepsis with septic shock  MRSA bacteremia  Hypotension  CAD (coronary artery disease)  Peripheral vascular disease, unspecified   Vital Signs:  BP (!) 80/75   Pulse 84   Temp 98 F (36.7 C) (Oral)   Resp (!) 22   Ht 6' 1 (1.854 m)   Wt 124.9 kg   SpO2 93%   BMI 36.33 kg/m   Physical Exam Constitutional:      Appearance: He is ill-appearing.  HENT:     Head: Normocephalic and atraumatic.  Eyes:     Extraocular Movements: Extraocular movements intact.  Pulmonary:     Effort: Pulmonary effort is normal.  Musculoskeletal:     Right lower leg: Edema present.     Left lower leg: Edema present.  Skin:    General: Skin is warm.  Neurological:     General: No focal deficit present.     Mental Status: He is alert.     Comments: Limited insight on serious illness.   Psychiatric:     Comments: Flat affect    Palliative Assessment/Data: 40%   Existing Vynca/ACP Documentation: Daughter is HCPOA  Thank you for allowing us  to participate in the care of DUY LEMMING [name]. PMT will continue to support holistically.  I personally spent a total of 35 minutes in the care of the patient today including preparing to see the patient, getting/reviewing separately obtained history, counseling and educating, referring  and communicating with other health care professionals, and documenting clinical information in the EHR.  Fairy FORBES Shan DEVONNA  Palliative Medicine Team  Team Phone # [phone] (Nights/Weekends) 11/18/2024 3:03 PM  "

## 2024-11-18 NOTE — Progress Notes (Signed)
 "  NAME:  [name] Hemp., MRN:  [id number], DOB:  [birth year], LOS: 25 ADMISSION DATE:  10/24/2024, CONSULTATION DATE:  12/17 REFERRING MD:  Ricky - [facility], CHIEF COMPLAINT:  hypotension  History of Present Illness:  [name] MARTENSON Sr. is a 64 y.o. male who has a PMH as outlined below including but not limited to ESRD on HD MWF, A-fib, systolic CHF, hypertension, LBBB, DM.  He presented to [facility] ED on 10/24/2024 with dyspnea x roughly 2-3 weeks. He had been seen in the ED a few days early on 12/2 for dysuria and was started on a course of Cipro  which she started the following day and stated that he had been taking as prescribed.     CT chest that demonstrated multifocal pneumonia and small effusions.  Blood cultures ultimately grew MRSA.  He had been on vancomycin  which was continued. He had a left thora 12/10 with 450cc fluid removed.  His HD catheter was removed on 10/28/2024 as well as an arterial line.  He did have a TTE on 12/11 that showed an EF of 20-25%, G2 DD, mild decrease in RV SF, severe AAS, no vegetations.  TEE was recommended but had been deferred due to patient requiring vasopressor support.  ID was consulted and recommended line holiday.  He had a temporary femoral HD cath placed on 12/15.  ID recommendations included 6 weeks of vancomycin  to end on 12/09/2024.    12/17, he was transferred to [facility] for ongoing management for septic shock as well as for IR evaluation for tunneled HD catheter placement  Pertinent  Medical History  ESRD on HD MWF, Afib, HFrEF, T2DM, LBBB, severe PVD s/p R BKA Past Medical History:  Diagnosis Date   ESRD on hemodialysis (HCC)    a. HD MWF > 10 years.   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 01/2022 Echo: EF 45-50%, glob HK, mod LVH, mildly reduced RV fxn, mild MR, mod-sev MV Ca2+. Mild AS; b. 10/2023 Echo: EF 45-50%, glob HK, GrII DD, mildly reduced RV fxn, RVSP 52.63mmHg. Mild MR/MS, mod TR.   Hypertension    Nonischemic  cardiomyopathy (HCC)    a. 2019 Cath [facility]): Nonobstructive disease; b. 12/2019 Echo: EF 50%; c. 12/2019 MV (Tx w/u [facility]): Ant infarct w/ peri-infarct ischemia. EF 35%; c. 05/2021 MV (Tx w/u [facility]): Cor Ca2+, EF 53%, motion artifact, equiv study, likely low risk.   Nonobstructive coronary artery disease    a.  2019 Cath ([facility]): Left main normal, LAD 30 proximal, RCA 30 distal, EF 25%.   Obesity    Peripheral arterial disease    Peripheral vascular disease    Type 2 diabetes mellitus (HCC)    a. 10/2023 - admission w/ HHS.   Significant Hospital Events: Including procedures, antibiotic start and stop dates in addition to other pertinent events   12/8 admit 12/10 L thora with 450cc removed, transudate, neg cultures 12/11 echo 12/12 HD cath and art line removed 12/15 new femoral HD cath placed 12/17 transfer to [facility] 58M 12/19 HD  12/20 CRRT 12/23 Weaned off levophed  this am. HD trial this am. Transferred to [facility] 12/25 Returned to ICU for hypotension 12/28-remains on Levophed  12/29-remains on Levophed , tolerating dialysis 12/31 was off Levophed  this morning 1/2 on Levophed    Interim History / Subjective:  Awake and alert Denies any pain  Discussed concern of still requiring pressor to support BP  Discussed concern of tolerating HD outpatient  Will call daughter today   Objective    Blood pressure 125/64, pulse 87, temperature 98.8 F (37.1 C), temperature source Axillary, resp. rate (!) 22, height 6' 1 (1.854 m), weight 123.3 kg, SpO2 90%.        Intake/Output Summary (Last 24 hours) at 11/18/2024 9297 Last data filed at 11/18/2024 9366 Gross per 24 hour  Intake 1382.44 ml  Output --  Net 1382.44 ml   Filed Weights   11/16/24 1200 11/17/24 0500 11/18/24 0207  Weight: 118 kg 124.1 kg 123.3 kg   Physical Exam: General: Middle-age, alert, does not appear to be in distress HENT: Moist oral mucosa Respiratory: Normal effort on room air. Clear breath sounds  bilaterally Cardiovascular: RRR GI: BS +, soft, NT Extremities: Right below-knee amputation, 2+ pitting edema in left lower extremity neuro: Awake, alert and oriented  Lab data reviewed Na 127 (chronic/stable) Hgb 10.8 (stable)  On renal dose vancomycin   Resolved problem list   Assessment and Plan   Multifocal pneumonia Hypoxemic respiratory failure, resolved -Continue bronchodilators -Remains on room air  Chronic hypotension - On midodrine  20 mg TID, Strattera  20 mg BID - Levophed  restarted this AM, wean as tolerated w/ MAP goal >60  Heart failure with reduced ejection fraction of 20 to 25% Severe aortic stenosis - Volume removal per nephrology  Septic shock secondary to MRSA bacteremia - Goal is to complete vancomycin  dosing on 12/09/2024 - Has temporary dialysis catheter in place - This has to be replaced when he is more stable, will need to contract contact IR at the time - Unable to complete TEE at this time  End-stage renal disease on hemodialysis - Appreciate nephrology assistance, HD per nephrology  - Continue Cinacalcet  and sevelamer  - Has temp HD catheter, will need TDC soon  Type 2 diabetes - CBG w/in goal  - Continue SSI-M  - Novolog  3 units TID w/ meals - Lantus  5 units daily   Peripheral vascular disease - On aspirin    Labs   CBC: Recent Labs  Lab 11/13/24 0300 11/14/24 0300 11/15/24 0456 11/16/24 0436 11/18/24 0500  WBC 11.2* 9.4 8.5 7.4 7.4  HGB 10.3* 10.9* 11.0* 10.8* 10.8*  HCT 31.5* 31.7* 32.7* 32.5* 32.6*  MCV 77.4* 76.6* 76.4* 77.9* 78.7*  PLT 212 223 235 231 254    Basic Metabolic Panel: Recent Labs  Lab 11/13/24 0300 11/14/24 0300 11/15/24 0456 11/15/24 0457 11/16/24 0436 11/17/24 0339  NA 126* 126*  --  126* 127* 128*  K 4.5 4.8  --  4.4 4.4 4.4  CL 92* 92*  --  91* 93* 92*  CO2 22 21*  --  20* 22 24  GLUCOSE 179* 170*  --  155* 83 126*  BUN 34* 41*  --  31* 36* 26*  CREATININE 5.51* 6.37*  --  5.46* 6.07* 5.00*   CALCIUM  7.4* 7.4*  --  7.7* 7.6* 8.0*  MG 2.0 2.0 2.0  --  2.0 2.0  PHOS 2.8 3.0  --  2.8 3.1 3.0   GFR: Estimated Creatinine Clearance: 21.1 mL/min (A) (by C-G formula based on SCr of 5 mg/dL (H)). Recent Labs  Lab 11/14/24 0300 11/15/24 0456 11/16/24 0436 11/18/24 0500  WBC 9.4 8.5 7.4 7.4    Liver Function Tests: Recent Labs  Lab 11/13/24 0300 11/14/24 0300 11/15/24 0457 11/16/24 0436 11/17/24 0339  ALBUMIN  3.0* 3.0* 3.0* 2.8* 3.0*   No results for input(s): LIPASE, AMYLASE in the last 168 hours. No results for input(s): AMMONIA in  the last 168 hours.  ABG    Component Value Date/Time   PHART 7.42 11/11/2023 1225   PCO2ART 36 11/11/2023 1225   PO2ART 59 (L) 11/11/2023 1225   HCO3 33.0 (H) 02/11/2024 1506   TCO2 27 03/30/2024 0757   ACIDBASEDEF 0.6 11/11/2023 1225   O2SAT 63.1 11/03/2024 0831     Coagulation Profile: No results for input(s): INR, PROTIME in the last 168 hours.  Cardiac Enzymes: No results for input(s): CKTOTAL, CKMB, CKMBINDEX, TROPONINI in the last 168 hours.  HbA1C: Hgb A1c MFr Bld  Date/Time Value Ref Range Status  10/24/2024 10:14 AM 8.6 (H) 4.8 - 5.6 % Final    Comment:    (NOTE) Diagnosis of Diabetes The following HbA1c ranges recommended by the American Diabetes Association (ADA) may be used as an aid in the diagnosis of diabetes mellitus.  Hemoglobin             Suggested A1C NGSP%              Diagnosis  <5.7                   Non Diabetic  5.7-6.4                Pre-Diabetic  >6.4                   Diabetic  <7.0                   Glycemic control for                       adults with diabetes.    02/11/2024 02:27 PM 8.4 (H) 4.8 - 5.6 % Final    Comment:    (NOTE) Pre diabetes:          5.7%-6.4%  Diabetes:              >6.4%  Glycemic control for   <7.0% adults with diabetes     CBG: Recent Labs  Lab 11/17/24 1526 11/17/24 1714 11/17/24 1942 11/17/24 2341 11/18/24 0327  GLUCAP  122* 126* 109* 122* 179*      "

## 2024-11-18 NOTE — Progress Notes (Signed)
 PT Cancellation Note  Patient Details Name: [name] [name] Sr. MRN: [id number] DOB: 14-Apr-1961   Cancelled Treatment:    Reason Eval/Treat Not Completed: Patient declined, no reason specified  Politely declines to work with therapy this afternoon. States he does not feel well enough to participate. Will continue to follow and progress as tolerated.  Leontine Roads, PT, DPT [facility]  Rehabilitation Services Physical Therapist Office: [phone] Website: [email or web address]   Leontine GORMAN Roads 11/18/2024, 2:02 PM

## 2024-11-18 NOTE — Progress Notes (Signed)
 Received patient in bed to unit.  Alert and oriented.  Informed consent signed and in chart.   TX duration:3.5  Patient tolerated well.  Transported back to the room  Alert, without acute distress.  Hand-off given to patient's nurse.   Access used: Right Femoral HD cath Access issues: none  Total UF removed: 3L Medication(s) given: Vancomycin , Albumin  x2   11/18/24 1812  Vitals  Temp 98.4 F (36.9 C)  Temp Source Oral  BP 101/84  MAP (mmHg) 91  Pulse Rate 95  ECG Heart Rate 95  Resp (!) 23  Weight 121.9 kg  Type of Weight Post-Dialysis  Oxygen Therapy  SpO2 98 %  O2 Device Room Air  During Treatment Monitoring  Duration of HD Treatment -hour(s) 3.5 hour(s)  HD Safety Checks Performed Yes  Intra-Hemodialysis Comments Tx completed  Post Treatment  Dialyzer Clearance Heavily streaked  Liters Processed 80.9  Fluid Removed (mL) 3000 mL  Tolerated HD Treatment Yes  Hemodialysis Catheter Left Femoral vein Triple lumen Temporary (Non-Tunneled)  Placement Date/Time: 10/31/24 1250   Time Out: Correct patient;Correct site;Correct procedure  Maximum sterile barrier precautions: Hand hygiene;Cap;Mask;Sterile gown;Sterile gloves;Large sterile sheet;Sterile probe cover  Site Prep: Chlorhexidine  (pr...  Site Condition No complications  Blue Lumen Status Flushed;Antimicrobial dead end cap;Heparin  locked  Red Lumen Status Flushed;Antimicrobial dead end cap;Heparin  locked  Purple Lumen Status N/A  Catheter fill solution Heparin  1000 units/ml  Catheter fill volume (Arterial) 1.4 cc  Catheter fill volume (Venous) 1.4  Dressing Type Transparent  Dressing Status Antimicrobial disc/dressing in place;Clean, Dry, Intact  Drainage Description None  Dressing Change Due 11/25/24  Post treatment catheter status Capped and Clamped     Camellia Brasil LPN [facility]

## 2024-11-19 DIAGNOSIS — E1122 Type 2 diabetes mellitus with diabetic chronic kidney disease: Secondary | ICD-10-CM | POA: Diagnosis not present

## 2024-11-19 DIAGNOSIS — I4891 Unspecified atrial fibrillation: Secondary | ICD-10-CM | POA: Diagnosis not present

## 2024-11-19 DIAGNOSIS — R6521 Severe sepsis with septic shock: Secondary | ICD-10-CM | POA: Diagnosis not present

## 2024-11-19 DIAGNOSIS — Z794 Long term (current) use of insulin: Secondary | ICD-10-CM | POA: Diagnosis not present

## 2024-11-19 DIAGNOSIS — I132 Hypertensive heart and chronic kidney disease with heart failure and with stage 5 chronic kidney disease, or end stage renal disease: Secondary | ICD-10-CM

## 2024-11-19 DIAGNOSIS — I9589 Other hypotension: Secondary | ICD-10-CM | POA: Diagnosis not present

## 2024-11-19 DIAGNOSIS — I5022 Chronic systolic (congestive) heart failure: Secondary | ICD-10-CM | POA: Diagnosis not present

## 2024-11-19 DIAGNOSIS — J188 Other pneumonia, unspecified organism: Secondary | ICD-10-CM | POA: Diagnosis not present

## 2024-11-19 DIAGNOSIS — I350 Nonrheumatic aortic (valve) stenosis: Secondary | ICD-10-CM | POA: Diagnosis not present

## 2024-11-19 DIAGNOSIS — A4102 Sepsis due to Methicillin resistant Staphylococcus aureus: Secondary | ICD-10-CM | POA: Diagnosis not present

## 2024-11-19 DIAGNOSIS — N186 End stage renal disease: Secondary | ICD-10-CM | POA: Diagnosis not present

## 2024-11-19 LAB — RENAL FUNCTION PANEL
Albumin: 3.5 g/dL (ref 3.5–5.0)
Anion gap: 13 (ref 5–15)
BUN: 25 mg/dL — ABNORMAL HIGH (ref 8–23)
CO2: 25 mmol/L (ref 22–32)
Calcium: 8.3 mg/dL — ABNORMAL LOW (ref 8.9–10.3)
Chloride: 91 mmol/L — ABNORMAL LOW (ref 98–111)
Creatinine, Ser: 5.07 mg/dL — ABNORMAL HIGH (ref 0.61–1.24)
GFR, Estimated: 12 mL/min — ABNORMAL LOW
Glucose, Bld: 213 mg/dL — ABNORMAL HIGH (ref 70–99)
Phosphorus: 3.1 mg/dL (ref 2.5–4.6)
Potassium: 4.2 mmol/L (ref 3.5–5.1)
Sodium: 129 mmol/L — ABNORMAL LOW (ref 135–145)

## 2024-11-19 LAB — CBC
HCT: 32.4 % — ABNORMAL LOW (ref 39.0–52.0)
Hemoglobin: 10.8 g/dL — ABNORMAL LOW (ref 13.0–17.0)
MCH: 26.2 pg (ref 26.0–34.0)
MCHC: 33.3 g/dL (ref 30.0–36.0)
MCV: 78.5 fL — ABNORMAL LOW (ref 80.0–100.0)
Platelets: 270 K/uL (ref 150–400)
RBC: 4.13 MIL/uL — ABNORMAL LOW (ref 4.22–5.81)
RDW: 25.9 % — ABNORMAL HIGH (ref 11.5–15.5)
WBC: 7.4 K/uL (ref 4.0–10.5)
nRBC: 0 % (ref 0.0–0.2)

## 2024-11-19 LAB — GLUCOSE, CAPILLARY
Glucose-Capillary: 239 mg/dL — ABNORMAL HIGH (ref 70–99)
Glucose-Capillary: 141 mg/dL — ABNORMAL HIGH (ref 70–99)
Glucose-Capillary: 161 mg/dL — ABNORMAL HIGH (ref 70–99)
Glucose-Capillary: 153 mg/dL — ABNORMAL HIGH (ref 70–99)

## 2024-11-19 LAB — MAGNESIUM: Magnesium: 2 mg/dL (ref 1.7–2.4)

## 2024-11-19 MED ORDER — SODIUM CHLORIDE 0.9 % IV SOLN
250.0000 mg | Freq: Once | INTRAVENOUS | Status: AC
  Administered 2024-11-21: 250 mg via INTRAVENOUS
  Filled 2024-11-19: qty 20

## 2024-11-19 MED ORDER — VANCOMYCIN HCL 750 MG/150ML IV SOLN
750.0000 mg | Freq: Once | INTRAVENOUS | Status: AC
  Administered 2024-11-19: 750 mg via INTRAVENOUS
  Filled 2024-11-19: qty 150

## 2024-11-19 MED ORDER — HEPARIN SODIUM (PORCINE) 1000 UNIT/ML DIALYSIS
1000.0000 [IU] | INTRAMUSCULAR | Status: DC | PRN
  Filled 2024-11-19 (×5): qty 1

## 2024-11-19 MED ORDER — INSULIN ASPART 100 UNIT/ML IJ SOLN
4.0000 [IU] | Freq: Three times a day (TID) | INTRAMUSCULAR | Status: DC
  Administered 2024-11-19 – 2024-11-20 (×2): 4 [IU] via SUBCUTANEOUS
  Filled 2024-11-19 (×3): qty 4

## 2024-11-19 MED ORDER — ANTICOAGULANT SODIUM CITRATE 4% (200MG/5ML) IV SOLN: 5.0000 mL | Status: DC | PRN

## 2024-11-19 MED ORDER — INSULIN GLARGINE 100 UNIT/ML ~~LOC~~ SOLN
3.0000 [IU] | Freq: Once | SUBCUTANEOUS | Status: AC
  Administered 2024-11-19: 3 [IU] via SUBCUTANEOUS
  Filled 2024-11-19: qty 0.03

## 2024-11-19 MED ORDER — LIDOCAINE-PRILOCAINE 2.5-2.5 % EX CREA: 1.0000 | TOPICAL_CREAM | CUTANEOUS | Status: DC | PRN

## 2024-11-19 MED ORDER — LIDOCAINE HCL (PF) 1 % IJ SOLN: 5.0000 mL | INTRAMUSCULAR | Status: DC | PRN

## 2024-11-19 MED ORDER — PENTAFLUOROPROP-TETRAFLUOROETH EX AERO: 1.0000 | INHALATION_SPRAY | CUTANEOUS | Status: DC | PRN

## 2024-11-19 MED ORDER — INSULIN GLARGINE 100 UNIT/ML ~~LOC~~ SOLN
8.0000 [IU] | Freq: Every day | SUBCUTANEOUS | Status: DC
  Administered 2024-11-20: 8 [IU] via SUBCUTANEOUS
  Filled 2024-11-19: qty 0.08

## 2024-11-19 NOTE — Progress Notes (Signed)
 eLink Physician-Brief Progress Note Patient Name: [name] HALLSTROM Sr. DOB: September 17, 1961 MRN: [id number]   Date of Service  11/19/2024  HPI/Events of Note  A line not drawing back and no longer providing accurate reads  eICU Interventions  Remove aline     Intervention Category Intermediate Interventions: Hypotension - evaluation and management  Xochitl Egle Slater Staff 11/19/2024, 4:03 AM

## 2024-11-19 NOTE — Progress Notes (Signed)
 [name] NEPHROLOGY PROGRESS NOTE   Subjective:     Presentation summary: 64 year old man with past medical history significant for hypertension, type 2 diabetes mellitus, status post right below-knee amputation, nonobstructive coronary artery disease, congestive heart failure with reduced ejection fraction, obesity and end-stage renal disease on hemodialysis.  Admitted to the hospital yesterday with concerns of increasing difficulty with shortness of breath for the past 2 to 3 weeks with recently worsening chronic cough.  He recently was started on doxycycline  on 11/30 for possible LRTI and then on ciprofloxacin  on 12/3 for urinary tract infection.  He has had some subjective fevers at home with associated chills.  CT scan of the chest yesterday showed multifocal bronchopneumonia with small right and moderate loculated left pleural effusion with some atelectasis.  He was started on CAP coverage with vancomycin  and cefepime .  Overnight noted to be increasingly hypotensive prompting need to initiate Levophed .  He also has positive blood cultures (4 out of 4) for gram-positive cocci which is very concerning in the setting of an indwelling left IJ TDC.   Dialysis prescription:    On Sensipar  60 mg daily, midodrine  20 mg 3 times daily with option to take extra 20 mg mid to dialysis, Renvela  4000 mg 3 times daily AC.   Objective Vital signs in last 24 hours: Vitals:   11/19/24 1215 11/19/24 1230 11/19/24 1245 11/19/24 1255  BP: (!) 85/44 (!) 93/45 (!) 90/50 (!) 89/53  Pulse: 89 90 91 90  Resp: (!) 23 20 (!) 26 (!) 25  Temp:      TempSrc:      SpO2: 96% 97% 97% 97%  Weight:      Height:        Dialysis Orders: MWF [facility] 4h  B400  101.9kg  2K bath  TDC  Hep 6000 - Hectoral IV q HD - No ESA/iron   Physical Exam:        General adult male in bed in no acute distress Lungs clear to auscultation  Heart S1S2 no rub Abdomen soft nontender obese habitus Extremities  right BKA; diffuse 2-3+ edema all 4 extremities  Psych normal mood and affect Neuro awake and conversant Access groin temp HD catheter in place    Assessment/ Plan:  # ESRD:  - s/p CRRT vol overload w/ shock 12/20-12/22.  Then back on iHD but not tolerating well due to hypotension  - HD on MWF schedule here - getting extra HD today, due to severe vol overload  - getting low-dose levo 0-1 mcg/min - dialysis challenging due to hypotension; he has required levophed .  Noted palliative care team is following; suggest continued GOC discussions  # Hypervolemia:  - diffuse pitting edema, serum Na+ improving up to 129 today - confirmed on 1.2 liter fluid restriction  # Anemia of ESRD:    - s/p PRBC - iron  saturation 8%, S/p IV ferric gluconate 250mg  x 1 on 12/25, will give another 250mg  w/ HD x 1 more dose monday - After Hb peaked at 11 we paused aranesp  (was 60mcg every thurs, last dose 12/25).  Anticipate starting back soon at low dose.   # Septic/cardiogenic shock due to MRSA bacteremia:  - Abx per primary team - on vanc - pressors per primary team - [facility] was removed, status post temporary HD line.  IR consulted for new line - plan had been for a new tunneled catheter once off pressors and out of ICU.  At this point, would recommend tunneled  catheter placement early next week with IR regardless of levo or ICU status - Blood cultures 11/03/24 negative   # Hypotension/shock:  - On midodrine  20 mg 3 times daily - levo per primary team  - Fludrocortisone  was added by ICU team as well as strattera .    # Acute hypoxic respiratory failure due to fluid overload, acute systolic CHF and pneumonia:  - optimize volume status with HD - on antibiotics per PCCM.  #Secondary HPTH:   - phos ok but lower than goal - we reduced sevelamer  to 800 mg TID with meals (was on 3200 mg TID as of 12/29 AM and phos was low) - paused cinacalcet  for hypocalcemia. Reassess resuming at reduced dose in the  future  [name]  MD  [facility] 11/19/2024, 1:08 PM  Recent Labs  Lab 11/18/24 0500 11/19/24 0102  HGB 10.8* 10.8*  ALBUMIN  2.7* 3.5  CALCIUM  7.6* 8.3*  PHOS 3.4 3.1  CREATININE 5.80* 5.07*  K 4.8 4.2    Inpatient medications:  aspirin  EC  81 mg Oral Q breakfast   atomoxetine   20 mg Oral BID   Chlorhexidine  Gluconate Cloth  6 each Topical Q0600   feeding supplement  237 mL Oral Q24H   guaiFENesin   600 mg Oral BID   heparin   5,000 Units Subcutaneous Q8H   insulin  aspart  0-15 Units Subcutaneous TID WC   insulin  aspart  4 Units Subcutaneous TID WC   [START ON 11/20/2024] insulin  glargine  8 Units Subcutaneous Daily   midodrine   20 mg Oral Q8H   multivitamin  1 tablet Oral QHS   pantoprazole   40 mg Oral Daily   sevelamer  carbonate  800 mg Oral TID WC    anticoagulant sodium citrate      norepinephrine  (LEVOPHED ) Adult infusion 1 mcg/min (11/19/24 0800)   vancomycin  Stopped (11/18/24 1801)   vancomycin      acetaminophen  **OR** acetaminophen , alteplase , anticoagulant sodium citrate , guaiFENesin -dextromethorphan , heparin , heparin , heparin , hydrOXYzine , ipratropium-albuterol , lidocaine  (PF), lidocaine -prilocaine , mouth rinse, pentafluoroprop-tetrafluoroeth, phenol, polyethylene glycol, trimethobenzamide 

## 2024-11-19 NOTE — Progress Notes (Signed)
" °   11/19/24 1305  Vitals  Temp 98.6 F (37 C)  Pulse Rate 95  Resp (!) 24  BP (!) 115/40  SpO2 97 %  O2 Device Room Air  Weight 119.4 kg  Type of Weight Post-Dialysis  Oxygen Therapy  Patient Activity (if Appropriate) In bed  Pulse Oximetry Type Continuous  Oximetry Probe Site Changed No  Post Treatment  Dialyzer Clearance Clear  Hemodialysis Intake (mL) 0 mL  Liters Processed 83.9  Fluid Removed (mL) 3500 mL  Tolerated HD Treatment Yes  Post-Hemodialysis Comments levo iv gtt remained at 3mcg throughout the tx   Pt tx done at bedside---30m07  Alert and oriented.  Informed consent signed and in chart.   TX duration:  Patient tolerated well.  Alert, without acute distress.  Hand-off given to patient's nurse.   Access used: L femoral Trialysis Access issues: no complications  Total UF removed: 3500 Medication(s) given: none   Delon LITTIE [facility] "

## 2024-11-19 NOTE — Plan of Care (Signed)
" °  Problem: Education: Goal: Knowledge of General Education information will improve Description: Including pain rating scale, medication(s)/side effects and non-pharmacologic comfort measures Outcome: Progressing   Problem: Health Behavior/Discharge Planning: Goal: Ability to manage health-related needs will improve Outcome: Progressing   Problem: Clinical Measurements: Goal: Ability to maintain clinical measurements within normal limits will improve Outcome: Progressing Goal: Diagnostic test results will improve Outcome: Progressing Goal: Respiratory complications will improve Outcome: Progressing Goal: Cardiovascular complication will be avoided Outcome: Progressing   Problem: Activity: Goal: Risk for activity intolerance will decrease Outcome: Progressing   Problem: Nutrition: Goal: Adequate nutrition will be maintained Outcome: Progressing   Problem: Elimination: Goal: Will not experience complications related to bowel motility Outcome: Progressing   Problem: Pain Managment: Goal: General experience of comfort will improve and/or be controlled Outcome: Progressing   Problem: Safety: Goal: Ability to remain free from injury will improve Outcome: Progressing   Problem: Clinical Measurements: Goal: Ability to maintain a body temperature in the normal range will improve Outcome: Progressing   Problem: Respiratory: Goal: Ability to maintain adequate ventilation will improve Outcome: Progressing Goal: Ability to maintain a clear airway will improve Outcome: Progressing   Problem: Coping: Goal: Ability to adjust to condition or change in health will improve Outcome: Progressing   Problem: Metabolic: Goal: Ability to maintain appropriate glucose levels will improve Outcome: Progressing   Problem: Nutritional: Goal: Maintenance of adequate nutrition will improve Outcome: Progressing   Problem: Skin Integrity: Goal: Risk for impaired skin integrity will  decrease Outcome: Not Progressing   Problem: Activity: Goal: Ability to tolerate increased activity will improve Outcome: Not Progressing   Problem: Fluid Volume: Goal: Ability to maintain a balanced intake and output will improve Outcome: Not Progressing   "

## 2024-11-19 NOTE — Progress Notes (Signed)
 "  NAME:  [name] Umanzor., MRN:  [id number], DOB:  03-25-1961, LOS: 26 ADMISSION DATE:  10/24/2024, CONSULTATION DATE:  12/17 REFERRING MD:  Ricky - [facility], CHIEF COMPLAINT:  hypotension  History of Present Illness:  [name] MAYEUX Sr. is a 64 y.o. male who has a PMH as outlined below including but not limited to ESRD on HD MWF, A-fib, systolic CHF, hypertension, LBBB, DM.  He presented to [facility] ED on 10/24/2024 with dyspnea x roughly 2-3 weeks. He had been seen in the ED a few days early on 12/2 for dysuria and was started on a course of Cipro  which she started the following day and stated that he had been taking as prescribed.     CT chest that demonstrated multifocal pneumonia and small effusions.  Blood cultures ultimately grew MRSA.  He had been on vancomycin  which was continued. He had a left thora 12/10 with 450cc fluid removed.  His HD catheter was removed on 10/28/2024 as well as an arterial line.  He did have a TTE on 12/11 that showed an EF of 20-25%, G2 DD, mild decrease in RV SF, severe AAS, no vegetations.  TEE was recommended but had been deferred due to patient requiring vasopressor support.  ID was consulted and recommended line holiday.  He had a temporary femoral HD cath placed on 12/15.  ID recommendations included 6 weeks of vancomycin  to end on 12/09/2024.    12/17, he was transferred to [facility] for ongoing management for septic shock as well as for IR evaluation for tunneled HD catheter placement  Pertinent  Medical History  ESRD on HD MWF, Afib, HFrEF, T2DM, LBBB, severe PVD s/p R BKA Past Medical History:  Diagnosis Date   ESRD on hemodialysis (HCC)    a. HD MWF > 10 years.   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 01/2022 Echo: EF 45-50%, glob HK, mod LVH, mildly reduced RV fxn, mild MR, mod-sev MV Ca2+. Mild AS; b. 10/2023 Echo: EF 45-50%, glob HK, GrII DD, mildly reduced RV fxn, RVSP 52.77mmHg. Mild MR/MS, mod TR.   Hypertension    Nonischemic  cardiomyopathy (HCC)    a. 2019 Cath [facility]): Nonobstructive disease; b. 12/2019 Echo: EF 50%; c. 12/2019 MV (Tx w/u [facility]): Ant infarct w/ peri-infarct ischemia. EF 35%; c. 05/2021 MV (Tx w/u [facility]): Cor Ca2+, EF 53%, motion artifact, equiv study, likely low risk.   Nonobstructive coronary artery disease    a.  2019 Cath ([facility]): Left main normal, LAD 30 proximal, RCA 30 distal, EF 25%.   Obesity    Peripheral arterial disease    Peripheral vascular disease    Type 2 diabetes mellitus (HCC)    a. 10/2023 - admission w/ HHS.   Significant Hospital Events: Including procedures, antibiotic start and stop dates in addition to other pertinent events   12/8 admit 12/10 L thora with 450cc removed, transudate, neg cultures 12/11 echo 12/12 HD cath and art line removed 12/15 new femoral HD cath placed 12/17 transfer to [facility] 77M 12/19 HD  12/20 CRRT 12/23 Weaned off levophed  this am. HD trial this am. Transferred to [facility] 12/25 Returned to ICU for hypotension 12/28-remains on Levophed  12/29-remains on Levophed , tolerating dialysis 12/31 was off Levophed  this morning 1/2 on Levophed  and had HD  Interim History / Subjective:  Awake and alert. Denies new concerns. Still has cough. Denies nausea.  Had HD yesterday, SBP in 80s but MAP >70.  I called daughter yesterday, updated her and expressed my concerns about patient may not tolerate outpatient HD. She remains hopeful he will go to SNF.   Objective    Blood pressure (!) 94/59, pulse 87, temperature 98.6 F (37 C), temperature source Oral, resp. rate 17, height 6' 1 (1.854 m), weight 122.9 kg, SpO2 97%.        Intake/Output Summary (Last 24 hours) at 11/19/2024 0648 Last data filed at 11/19/2024 0600 Gross per 24 hour  Intake 261.98 ml  Output 3000 ml  Net -2738.02 ml   Filed Weights   11/18/24 1402 11/18/24 1812 11/19/24 0439  Weight: 124.9 kg 121.9 kg 122.9 kg   Physical Exam: General: Middle-age, alert, does not  appear to be in distress Respiratory: Normal effort on room air. Clear breath sounds bilaterally Cardiovascular: RRR GI: BS +, soft, NT Extremities: Right below-knee amputation, 2+ pitting edema in left lower extremity neuro: Awake, alert and oriented  Lab data reviewed Na 127--129 (chronic/stable) Hgb 10.8 (stable)  Resolved problem list   Assessment and Plan   Chronic hypotension - On midodrine  20 mg TID, Strattera  20 mg BID - Levophed  restarted 1/2,  wean as tolerated w/ MAP goal >60  Multifocal pneumonia Hypoxemic respiratory failure, resolved -Completed 5 days of CTX -Continue bronchodilators -Remains on room air  Heart failure with reduced ejection fraction of 20 to 25% Severe aortic stenosis - Not on GDMT due to hypotension  - Follows w/ outpatient cardiology, medical management - Volume removal per nephrology  Septic shock secondary to MRSA bacteremia - Goal is to complete vancomycin  dosing on 12/09/2024 - Has temporary dialysis catheter in place - Plan for [facility] per nephrology and IR next week  - Unable to complete TEE at this time  End-stage renal disease on hemodialysis - Appreciate nephrology assistance, HD per nephrology  - Home Cinacalcet  and sevelamer  - Has temp HD catheter, will need TDC soon (spoke w/ Dr. Jerrye, nephrology to speak w/ IR)  Type 2 diabetes CBG goal 140-180, at goal mostly.  - SSI-M  - Novolog  3 units TID w/ meals - Lantus  5 units daily   Peripheral vascular disease - On aspirin   GOC Palliative following. Discussion w/ pt and pt's daughter who is POA. PMT and CCM expressed concern to both how pt may not tolerate HD outpatient. He remains in ICU due to needs for pressor. Both aware of this but remains full code and scope.    Labs   CBC: Recent Labs  Lab 11/14/24 0300 11/15/24 0456 11/16/24 0436 11/18/24 0500 11/19/24 0102  WBC 9.4 8.5 7.4 7.4 7.4  HGB 10.9* 11.0* 10.8* 10.8* 10.8*  HCT 31.7* 32.7* 32.5* 32.6* 32.4*  MCV  76.6* 76.4* 77.9* 78.7* 78.5*  PLT 223 235 231 254 270    Basic Metabolic Panel: Recent Labs  Lab 11/15/24 0456 11/15/24 0457 11/16/24 0436 11/17/24 0339 11/18/24 0500 11/19/24 0102  NA  --  126* 127* 128* 127* 129*  K  --  4.4 4.4 4.4 4.8 4.2  CL  --  91* 93* 92* 92* 91*  CO2  --  20* 22 24 20* 25  GLUCOSE  --  155* 83 126* 177* 213*  BUN  --  31* 36* 26* 31* 25*  CREATININE  --  5.46* 6.07* 5.00* 5.80* 5.07*  CALCIUM   --  7.7* 7.6* 8.0* 7.6* 8.3*  MG 2.0  --  2.0 2.0 1.9 2.0  PHOS  --  2.8 3.1 3.0 3.4 3.1  GFR: Estimated Creatinine Clearance: 20.7 mL/min (A) (by C-G formula based on SCr of 5.07 mg/dL (H)). Recent Labs  Lab 11/15/24 0456 11/16/24 0436 11/18/24 0500 11/19/24 0102  WBC 8.5 7.4 7.4 7.4    Liver Function Tests: Recent Labs  Lab 11/15/24 0457 11/16/24 0436 11/17/24 0339 11/18/24 0500 11/19/24 0102  ALBUMIN  3.0* 2.8* 3.0* 2.7* 3.5   No results for input(s): LIPASE, AMYLASE in the last 168 hours. No results for input(s): AMMONIA in the last 168 hours.  ABG    Component Value Date/Time   PHART 7.42 11/11/2023 1225   PCO2ART 36 11/11/2023 1225   PO2ART 59 (L) 11/11/2023 1225   HCO3 33.0 (H) 02/11/2024 1506   TCO2 27 03/30/2024 0757   ACIDBASEDEF 0.6 11/11/2023 1225   O2SAT 63.1 11/03/2024 0831     Coagulation Profile: No results for input(s): INR, PROTIME in the last 168 hours.  Cardiac Enzymes: No results for input(s): CKTOTAL, CKMB, CKMBINDEX, TROPONINI in the last 168 hours.  HbA1C: Hgb A1c MFr Bld  Date/Time Value Ref Range Status  10/24/2024 10:14 AM 8.6 (H) 4.8 - 5.6 % Final    Comment:    (NOTE) Diagnosis of Diabetes The following HbA1c ranges recommended by the American Diabetes Association (ADA) may be used as an aid in the diagnosis of diabetes mellitus.  Hemoglobin             Suggested A1C NGSP%              Diagnosis  <5.7                   Non Diabetic  5.7-6.4                 Pre-Diabetic  >6.4                   Diabetic  <7.0                   Glycemic control for                       adults with diabetes.    02/11/2024 02:27 PM 8.4 (H) 4.8 - 5.6 % Final    Comment:    (NOTE) Pre diabetes:          5.7%-6.4%  Diabetes:              >6.4%  Glycemic control for   <7.0% adults with diabetes     CBG: Recent Labs  Lab 11/18/24 0327 11/18/24 0751 11/18/24 1131 11/18/24 1554 11/18/24 2108  GLUCAP 179* 221* 177* 112* 162*      "

## 2024-11-20 ENCOUNTER — Inpatient Hospital Stay (HOSPITAL_COMMUNITY)

## 2024-11-20 DIAGNOSIS — J188 Other pneumonia, unspecified organism: Secondary | ICD-10-CM | POA: Diagnosis not present

## 2024-11-20 DIAGNOSIS — E1122 Type 2 diabetes mellitus with diabetic chronic kidney disease: Secondary | ICD-10-CM | POA: Diagnosis not present

## 2024-11-20 DIAGNOSIS — I350 Nonrheumatic aortic (valve) stenosis: Secondary | ICD-10-CM | POA: Diagnosis not present

## 2024-11-20 DIAGNOSIS — J9601 Acute respiratory failure with hypoxia: Secondary | ICD-10-CM | POA: Diagnosis not present

## 2024-11-20 DIAGNOSIS — R6521 Severe sepsis with septic shock: Secondary | ICD-10-CM | POA: Diagnosis not present

## 2024-11-20 DIAGNOSIS — I5022 Chronic systolic (congestive) heart failure: Secondary | ICD-10-CM | POA: Diagnosis not present

## 2024-11-20 DIAGNOSIS — A4102 Sepsis due to Methicillin resistant Staphylococcus aureus: Secondary | ICD-10-CM | POA: Diagnosis not present

## 2024-11-20 DIAGNOSIS — I9589 Other hypotension: Secondary | ICD-10-CM | POA: Diagnosis not present

## 2024-11-20 DIAGNOSIS — Z794 Long term (current) use of insulin: Secondary | ICD-10-CM | POA: Diagnosis not present

## 2024-11-20 DIAGNOSIS — I132 Hypertensive heart and chronic kidney disease with heart failure and with stage 5 chronic kidney disease, or end stage renal disease: Secondary | ICD-10-CM | POA: Diagnosis not present

## 2024-11-20 DIAGNOSIS — N186 End stage renal disease: Secondary | ICD-10-CM | POA: Diagnosis not present

## 2024-11-20 DIAGNOSIS — I4891 Unspecified atrial fibrillation: Secondary | ICD-10-CM | POA: Diagnosis not present

## 2024-11-20 LAB — POCT I-STAT 7, (LYTES, BLD GAS, ICA,H+H)
Acid-Base Excess: 2 mmol/L (ref 0.0–2.0)
Acid-base deficit: 1 mmol/L (ref 0.0–2.0)
Bicarbonate: 26.2 mmol/L (ref 20.0–28.0)
Bicarbonate: 21.9 mmol/L (ref 20.0–28.0)
Calcium, Ion: 1.05 mmol/L — ABNORMAL LOW (ref 1.15–1.40)
Calcium, Ion: 1.06 mmol/L — ABNORMAL LOW (ref 1.15–1.40)
HCT: 40 % (ref 39.0–52.0)
HCT: 39 % (ref 39.0–52.0)
Hemoglobin: 13.6 g/dL (ref 13.0–17.0)
Hemoglobin: 13.3 g/dL (ref 13.0–17.0)
O2 Saturation: 71 %
O2 Saturation: 93 %
Patient temperature: 98.5
Patient temperature: 98.5
Potassium: 4.7 mmol/L (ref 3.5–5.1)
Potassium: 4.2 mmol/L (ref 3.5–5.1)
Sodium: 132 mmol/L — ABNORMAL LOW (ref 135–145)
Sodium: 131 mmol/L — ABNORMAL LOW (ref 135–145)
TCO2: 27 mmol/L (ref 22–32)
TCO2: 23 mmol/L (ref 22–32)
pCO2 arterial: 37.9 mmHg (ref 32–48)
pCO2 arterial: 31.5 mmHg — ABNORMAL LOW (ref 32–48)
pH, Arterial: 7.448 (ref 7.35–7.45)
pH, Arterial: 7.449 (ref 7.35–7.45)
pO2, Arterial: 35 mmHg — CL (ref 83–108)
pO2, Arterial: 62 mmHg — ABNORMAL LOW (ref 83–108)

## 2024-11-20 LAB — CBC
HCT: 34.2 % — ABNORMAL LOW (ref 39.0–52.0)
Hemoglobin: 11.3 g/dL — ABNORMAL LOW (ref 13.0–17.0)
MCH: 25.9 pg — ABNORMAL LOW (ref 26.0–34.0)
MCHC: 33 g/dL (ref 30.0–36.0)
MCV: 78.3 fL — ABNORMAL LOW (ref 80.0–100.0)
Platelets: 293 K/uL (ref 150–400)
RBC: 4.37 MIL/uL (ref 4.22–5.81)
RDW: 25.7 % — ABNORMAL HIGH (ref 11.5–15.5)
WBC: 9.6 K/uL (ref 4.0–10.5)
nRBC: 0 % (ref 0.0–0.2)

## 2024-11-20 LAB — GLUCOSE, CAPILLARY
Glucose-Capillary: 184 mg/dL — ABNORMAL HIGH (ref 70–99)
Glucose-Capillary: 147 mg/dL — ABNORMAL HIGH (ref 70–99)
Glucose-Capillary: 130 mg/dL — ABNORMAL HIGH (ref 70–99)
Glucose-Capillary: 114 mg/dL — ABNORMAL HIGH (ref 70–99)

## 2024-11-20 LAB — RENAL FUNCTION PANEL
Albumin: 3.5 g/dL (ref 3.5–5.0)
Anion gap: 13 (ref 5–15)
BUN: 21 mg/dL (ref 8–23)
CO2: 25 mmol/L (ref 22–32)
Calcium: 8.7 mg/dL — ABNORMAL LOW (ref 8.9–10.3)
Chloride: 93 mmol/L — ABNORMAL LOW (ref 98–111)
Creatinine, Ser: 4.64 mg/dL — ABNORMAL HIGH (ref 0.61–1.24)
GFR, Estimated: 13 mL/min — ABNORMAL LOW
Glucose, Bld: 201 mg/dL — ABNORMAL HIGH (ref 70–99)
Phosphorus: 3.2 mg/dL (ref 2.5–4.6)
Potassium: 4.5 mmol/L (ref 3.5–5.1)
Sodium: 131 mmol/L — ABNORMAL LOW (ref 135–145)

## 2024-11-20 LAB — MAGNESIUM: Magnesium: 2.1 mg/dL (ref 1.7–2.4)

## 2024-11-20 MED ORDER — SODIUM CHLORIDE 0.9 % IV SOLN: INTRAVENOUS | Status: AC | PRN

## 2024-11-20 MED ORDER — PHENYLEPHRINE CONCENTRATED 100MG/250ML (0.4 MG/ML) INFUSION SIMPLE
0.0000 ug/min | INTRAVENOUS | Status: DC
  Administered 2024-11-20: 20 ug/min via INTRAVENOUS
  Administered 2024-11-20: 340 ug/min via INTRAVENOUS
  Administered 2024-11-21: 330 ug/min via INTRAVENOUS
  Administered 2024-11-21: 250 ug/min via INTRAVENOUS
  Administered 2024-11-21: 280 ug/min via INTRAVENOUS
  Administered 2024-11-21: 400 ug/min via INTRAVENOUS
  Administered 2024-11-22: 360 ug/min via INTRAVENOUS
  Administered 2024-11-22: 400 ug/min via INTRAVENOUS
  Administered 2024-11-22: 300 ug/min via INTRAVENOUS
  Administered 2024-11-22: 400 ug/min via INTRAVENOUS
  Administered 2024-11-23 – 2024-11-24 (×5): 250 ug/min via INTRAVENOUS
  Administered 2024-11-24: 20 ug/min via INTRAVENOUS
  Filled 2024-11-20 (×17): qty 250

## 2024-11-20 MED ORDER — PHENYLEPHRINE HCL-NACL 20-0.9 MG/250ML-% IV SOLN: 0.0000 ug/min | INTRAVENOUS | Status: DC

## 2024-11-20 MED ORDER — PIPERACILLIN-TAZOBACTAM IN DEX 2-0.25 GM/50ML IV SOLN
2.2500 g | Freq: Four times a day (QID) | INTRAVENOUS | Status: DC
  Administered 2024-11-20 – 2024-11-22 (×9): 2.25 g via INTRAVENOUS
  Filled 2024-11-20 (×10): qty 50

## 2024-11-20 MED ORDER — FENTANYL CITRATE (PF) 50 MCG/ML IJ SOSY
50.0000 ug | PREFILLED_SYRINGE | Freq: Once | INTRAMUSCULAR | Status: AC
  Administered 2024-11-20: 50 ug via INTRAVENOUS

## 2024-11-20 MED ORDER — FENTANYL CITRATE (PF) 50 MCG/ML IJ SOSY
50.0000 ug | PREFILLED_SYRINGE | Freq: Once | INTRAMUSCULAR | Status: AC
  Filled 2024-11-20: qty 1

## 2024-11-20 MED ORDER — NOREPINEPHRINE 16 MG/250ML-% IV SOLN
0.0000 ug/min | INTRAVENOUS | Status: DC
  Administered 2024-11-20: 15 ug/min via INTRAVENOUS
  Administered 2024-11-22 – 2024-11-24 (×2): 2 ug/min via INTRAVENOUS
  Administered 2024-11-26: 3 ug/min via INTRAVENOUS
  Administered 2024-11-30: 1 ug/min via INTRAVENOUS
  Filled 2024-11-20 (×5): qty 250

## 2024-11-20 MED ORDER — CHLORHEXIDINE GLUCONATE CLOTH 2 % EX PADS
6.0000 | MEDICATED_PAD | Freq: Every day | CUTANEOUS | Status: DC
  Administered 2024-11-22 – 2024-11-24 (×3): 6 via TOPICAL

## 2024-11-20 MED ORDER — VASOPRESSIN 20 UNITS/100 ML INFUSION FOR SHOCK
0.0000 [IU]/min | INTRAVENOUS | Status: DC
  Administered 2024-11-20 – 2024-11-30 (×23): 0.03 [IU]/min via INTRAVENOUS
  Filled 2024-11-20 (×22): qty 100

## 2024-11-20 MED ORDER — LACTATED RINGERS IV BOLUS
250.0000 mL | Freq: Once | INTRAVENOUS | Status: AC
  Administered 2024-11-20: 250 mL via INTRAVENOUS

## 2024-11-20 MED ORDER — INSULIN GLARGINE-YFGN 100 UNIT/ML ~~LOC~~ SOLN
8.0000 [IU] | Freq: Every day | SUBCUTANEOUS | Status: DC
  Filled 2024-11-20: qty 0.08

## 2024-11-20 NOTE — Progress Notes (Addendum)
 eLink Physician-Brief Progress Note Patient Name: [name] DJORDJEVIC Sr. DOB: 25-Jul-1961 MRN: [id number]   Date of Service  11/20/2024  HPI/Events of Note  up to and unreliable cuff pressures  eICU Interventions  Replace aline   0218 - pt refused a-line, tachypneic and tachycardic with dry cough of which is unchanged.  Comfortable appearing on visual exam, could consider HHFNC if his work of breathing increases  Intervention Category Intermediate Interventions: Hypotension - evaluation and management  Lucero Auzenne 11/20/2024, 12:23 AM

## 2024-11-20 NOTE — Progress Notes (Addendum)
 Dr Pleas made aware that pt has been transitioned off Levo and on Neo. However, pt is requiring 300 mcg of Neo to keep MAP >65. Order for Vasopressin  to be started.  Pt has converted back in NSR, with HR now in 90's.

## 2024-11-20 NOTE — Plan of Care (Signed)
 Brief Palliative Medicine Progress Note:  PMT following peripherally for needs/decline:  Medical records reviewed including progress notes. No acute changes. Next HD planned for tomorrow 1/5. Noted concerns patient may not be able to tolerate outpatient HD. He is still in the ICU due to ongoing pressor support needs.  Goals are clear for full code/full scope.   PMT will follow up and visit with patient and family for goals of care discussions as appropriate and based on clinical course. If there are any imminent needs please call the service directly.   Thank you for allowing PMT to assist in the care of this patient.  [name] Folmar M. [facility] Palliative Medicine Team Team Phone: [phone] NO CHARGE

## 2024-11-20 NOTE — Plan of Care (Signed)
" °  Problem: Education: Goal: Knowledge of General Education information will improve Description: Including pain rating scale, medication(s)/side effects and non-pharmacologic comfort measures Outcome: Progressing   Problem: Health Behavior/Discharge Planning: Goal: Ability to manage health-related needs will improve Outcome: Progressing   Problem: Nutrition: Goal: Adequate nutrition will be maintained Outcome: Progressing   Problem: Elimination: Goal: Will not experience complications related to bowel motility Outcome: Progressing   Problem: Safety: Goal: Ability to remain free from injury will improve Outcome: Progressing   Problem: Skin Integrity: Goal: Risk for impaired skin integrity will decrease Outcome: Progressing   Problem: Respiratory: Goal: Ability to maintain a clear airway will improve Outcome: Progressing   Problem: Clinical Measurements: Goal: Ability to maintain clinical measurements within normal limits will improve Outcome: Not Progressing Goal: Diagnostic test results will improve Outcome: Not Progressing Goal: Respiratory complications will improve Outcome: Not Progressing Goal: Cardiovascular complication will be avoided Outcome: Not Progressing   Problem: Activity: Goal: Ability to tolerate increased activity will improve Outcome: Not Progressing   Problem: Clinical Measurements: Goal: Ability to maintain a body temperature in the normal range will improve Outcome: Not Progressing   Problem: Respiratory: Goal: Ability to maintain adequate ventilation will improve Outcome: Not Progressing   "

## 2024-11-20 NOTE — Progress Notes (Addendum)
 Pt only has 1 extremity (right wrist) in which BP cuff can be placed. BP has been very unreliable. With BPs ranging from 50s/10s to 90s/60s. Pt alert but lethargic at times with RASS 0 to -1.   MD aware of BP. Waiting on MD to place femoral arterial line.

## 2024-11-20 NOTE — Procedures (Signed)
 Arterial Catheter Insertion Procedure Note  [name] [name]  [id number]  07-08-1961  Date:11/20/2024  Time:12:38 PM    Provider Performing: Benton D. Harris    Procedure: Insertion of Arterial Line (63379) with US  guidance (23062)   Indication(s) Blood pressure monitoring and/or need for frequent ABGs  Consent Risks of the procedure as well as the alternatives and risks of each were explained to the patient and/or caregiver.  Consent for the procedure was obtained and is signed in the bedside chart  Anesthesia None   Time Out Verified patient identification, verified procedure, site/side was marked, verified correct patient position, special equipment/implants available, medications/allergies/relevant history reviewed, required imaging and test results available.   Sterile Technique Maximal sterile technique including full sterile barrier drape, hand hygiene, sterile gown, sterile gloves, mask, hair covering, sterile ultrasound probe cover (if used).   Procedure Description Area of catheter insertion was cleaned with chlorhexidine  and draped in sterile fashion. With real-time ultrasound guidance an arterial catheter was placed into the right femoral artery.  Appropriate arterial tracings confirmed on monitor.     Complications/Tolerance None; patient tolerated the procedure well.   EBL Minimal   Specimen(s) None   [name] [name] D. Harris, NP-C [facility] Personal contact information can be found on Amion  If no contact or response made please call [phone] 11/20/2024, 12:38 PM

## 2024-11-20 NOTE — Progress Notes (Addendum)
 "  NAME:  [name] Cookston., MRN:  [id number], DOB:  1961-07-26, LOS: 27 ADMISSION DATE:  10/24/2024, CONSULTATION DATE:  12/17 REFERRING MD:  Ricky - [facility], CHIEF COMPLAINT:  hypotension  History of Present Illness:  [name] SARGENT Sr. is a 64 y.o. male who has a PMH as outlined below including but not limited to ESRD on HD MWF, A-fib, systolic CHF, hypertension, LBBB, DM.  He presented to [facility] ED on 10/24/2024 with dyspnea x roughly 2-3 weeks. He had been seen in the ED a few days early on 12/2 for dysuria and was started on a course of Cipro  which she started the following day and stated that he had been taking as prescribed.     CT chest that demonstrated multifocal pneumonia and small effusions.  Blood cultures ultimately grew MRSA.  He had been on vancomycin  which was continued. He had a left thora 12/10 with 450cc fluid removed.  His HD catheter was removed on 10/28/2024 as well as an arterial line.  He did have a TTE on 12/11 that showed an EF of 20-25%, G2 DD, mild decrease in RV SF, severe AAS, no vegetations.  TEE was recommended but had been deferred due to patient requiring vasopressor support.  ID was consulted and recommended line holiday.  He had a temporary femoral HD cath placed on 12/15.  ID recommendations included 6 weeks of vancomycin  to end on 12/09/2024.    12/17, he was transferred to [facility] for ongoing management for septic shock as well as for IR evaluation for tunneled HD catheter placement  Pertinent  Medical History  ESRD on HD MWF, Afib, HFrEF, T2DM, LBBB, severe PVD s/p R BKA  Past Medical History:  Diagnosis Date   ESRD on hemodialysis (HCC)    a. HD MWF > 10 years.   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 01/2022 Echo: EF 45-50%, glob HK, mod LVH, mildly reduced RV fxn, mild MR, mod-sev MV Ca2+. Mild AS; b. 10/2023 Echo: EF 45-50%, glob HK, GrII DD, mildly reduced RV fxn, RVSP 52.67mmHg. Mild MR/MS, mod TR.   Hypertension    Nonischemic  cardiomyopathy (HCC)    a. 2019 Cath [facility]): Nonobstructive disease; b. 12/2019 Echo: EF 50%; c. 12/2019 MV (Tx w/u [facility]): Ant infarct w/ peri-infarct ischemia. EF 35%; c. 05/2021 MV (Tx w/u [facility]): Cor Ca2+, EF 53%, motion artifact, equiv study, likely low risk.   Nonobstructive coronary artery disease    a.  2019 Cath ([place] [facility]): Left main normal, LAD 30 proximal, RCA 30 distal, EF 25%.   Obesity    Peripheral arterial disease    Peripheral vascular disease    Type 2 diabetes mellitus (HCC)    a. 10/2023 - admission w/ HHS.   Significant Hospital Events: Including procedures, antibiotic start and stop dates in addition to other pertinent events   12/8 admit 12/10 L thora with 450cc removed, transudate, neg cultures 12/11 echo 12/12 HD cath and art line removed 12/15 new femoral HD cath placed 12/17 transfer to [facility] 59M 12/19 HD  12/20 CRRT 12/23 Weaned off levophed  this am. HD trial this am. Transferred to [facility] 12/25 Returned to ICU for hypotension 12/28-remains on Levophed  12/29-remains on Levophed , tolerating dialysis 12/31 was off Levophed  this morning 1/2 on Levophed  and had HD   Interim History / Subjective:   Lethargic but awake and following commands Started on oxygen at night, switched to high flow for comfort A  fib with RVR in 130s this am A line needed to be removed. Pt refusing a line placement  Objective    Blood pressure 97/85, pulse (!) 130, temperature 98.5 F (36.9 C), temperature source Oral, resp. rate 13, height 6' 1 (1.854 m), weight 119.1 kg, SpO2 94%.    FiO2 (%):  [40 %-60 %] 40 %   Intake/Output Summary (Last 24 hours) at 11/20/2024 1239 Last data filed at 11/20/2024 1100 Gross per 24 hour  Intake 1156.96 ml  Output 3500 ml  Net -2343.04 ml   Filed Weights   11/19/24 0902 11/19/24 1305 11/20/24 0415  Weight: 122.9 kg 119.4 kg 119.1 kg   Physical Exam: General: Middle-age, alert, does not appear to be in distress Respiratory:  Normal effort on room air. Clear breath sounds bilaterally Cardiovascular: RRR GI: BS +, soft, NT Extremities: Right below-knee amputation, 2+ pitting edema in left lower extremity neuro: Awake, alert and oriented   Resolved problem list  Multifocal pneumonia with hypoxia resolved 1/02  Assessment and Plan   Chronic hypotension - On midodrine  20 mg TID, Strattera  20 mg BID - Levophed  restarted 1/2,  wean as tolerated w/ MAP goal >60 - has recurrence of A fib with RVR/ will start phenylephrine   - placed femoral line after discussion with patient and daughter  New acute Hypoxemic respiratory failure, likely from new aspiration vs fluid overload -Completed 5 days of CTX -Continue bronchodilators -continue vancomycin , start zosyn  - had fever, and IVC collapsible - favor pneumonia- chest xray shows congestion  A fib with RVR Likely from new sepsis Will give small fluid bolus and monitor response Start phenylephrine  and d/c  levophed  Will start amiodarone drip if remains persistently above 120-130s Not a candidate for BB or CCB for rate control due to hypotension  Heart failure with reduced ejection fraction of 20 to 25% Severe aortic stenosis - Not on GDMT due to hypotension  - Follows w/ outpatient cardiology, medical management - Volume removal per nephrology  Septic shock secondary to MRSA bacteremia - Goal is to complete vancomycin  dosing on 12/09/2024 - Has temporary dialysis catheter in place - Plan for [facility] per nephrology and IR next week  - Unable to complete TEE at this time  End-stage renal disease on hemodialysis - Appreciate nephrology assistance, HD per nephrology  - Home Cinacalcet  and sevelamer  - Has temp HD catheter, will need TDC   Type 2 diabetes CBG goal 140-180, at goal mostly.  - continue lantus  and SS  Peripheral vascular disease - On aspirin   [facility] Palliative following. Discussion w/ pt and pt's daughter who is POA. PMT and CCM expressed concern  to both how pt may not tolerate HD outpatient. He remains in ICU due to needs for pressor. Both aware of this but remains full code and scope.    Labs   CBC: Recent Labs  Lab 11/15/24 0456 11/16/24 0436 11/18/24 0500 11/19/24 0102 11/20/24 0237  WBC 8.5 7.4 7.4 7.4 9.6  HGB 11.0* 10.8* 10.8* 10.8* 11.3*  HCT 32.7* 32.5* 32.6* 32.4* 34.2*  MCV 76.4* 77.9* 78.7* 78.5* 78.3*  PLT 235 231 254 270 293    Basic Metabolic Panel: Recent Labs  Lab 11/16/24 0436 11/17/24 0339 11/18/24 0500 11/19/24 0102 11/20/24 0237  NA 127* 128* 127* 129* 131*  K 4.4 4.4 4.8 4.2 4.5  CL 93* 92* 92* 91* 93*  CO2 22 24 20* 25 25  GLUCOSE 83 126* 177* 213* 201*  BUN 36* 26* 31* 25* 21  CREATININE 6.07* 5.00* 5.80* 5.07* 4.64*  CALCIUM  7.6* 8.0* 7.6* 8.3* 8.7*  MG 2.0 2.0 1.9 2.0 2.1  PHOS 3.1 3.0 3.4 3.1 3.2   GFR: Estimated Creatinine Clearance: 22.3 mL/min (A) (by C-G formula based on SCr of 4.64 mg/dL (H)). Recent Labs  Lab 11/16/24 0436 11/18/24 0500 11/19/24 0102 11/20/24 0237  WBC 7.4 7.4 7.4 9.6    Liver Function Tests: Recent Labs  Lab 11/16/24 0436 11/17/24 0339 11/18/24 0500 11/19/24 0102 11/20/24 0237  ALBUMIN  2.8* 3.0* 2.7* 3.5 3.5   No results for input(s): LIPASE, AMYLASE in the last 168 hours. No results for input(s): AMMONIA in the last 168 hours.  ABG    Component Value Date/Time   PHART 7.42 11/11/2023 1225   PCO2ART 36 11/11/2023 1225   PO2ART 59 (L) 11/11/2023 1225   HCO3 33.0 (H) 02/11/2024 1506   TCO2 27 03/30/2024 0757   ACIDBASEDEF 0.6 11/11/2023 1225   O2SAT 63.1 11/03/2024 0831     Coagulation Profile: No results for input(s): INR, PROTIME in the last 168 hours.  Cardiac Enzymes: No results for input(s): CKTOTAL, CKMB, CKMBINDEX, TROPONINI in the last 168 hours.  HbA1C: Hgb A1c MFr Bld  Date/Time Value Ref Range Status  10/24/2024 10:14 AM 8.6 (H) 4.8 - 5.6 % Final    Comment:    (NOTE) Diagnosis of Diabetes The  following HbA1c ranges recommended by the American Diabetes Association (ADA) may be used as an aid in the diagnosis of diabetes mellitus.  Hemoglobin             Suggested A1C NGSP%              Diagnosis  <5.7                   Non Diabetic  5.7-6.4                Pre-Diabetic  >6.4                   Diabetic  <7.0                   Glycemic control for                       adults with diabetes.    02/11/2024 02:27 PM 8.4 (H) 4.8 - 5.6 % Final    Comment:    (NOTE) Pre diabetes:          5.7%-6.4%  Diabetes:              >6.4%  Glycemic control for   <7.0% adults with diabetes     CBG: Recent Labs  Lab 11/19/24 1150 11/19/24 1553 11/19/24 2102 11/20/24 0731 11/20/24 1123  GLUCAP 141* 161* 153* 184* 147*    This patient is critically ill with organ(s) system failure which requires frequent high complexity decision making, assessment, support, evaluation, and titration of therapies. This was completed through the application of advanced monitoring technologies and extensive interpretation of multiple databases.  During this encounter critical care time was devoted to patient care services described in this note for 35 minutes.  "

## 2024-11-20 NOTE — Progress Notes (Signed)
 [name] NEPHROLOGY PROGRESS NOTE   Subjective:   Had HD yest w/ 3.5 L off w/ pressor support Today less responsive CXR w/ R sided opacities and IS edema  Presentation summary: 64 year old man with past medical history significant for hypertension, type 2 diabetes mellitus, status post right below-knee amputation, nonobstructive coronary artery disease, congestive heart failure with reduced ejection fraction, obesity and end-stage renal disease on hemodialysis.  Admitted to the hospital yesterday with concerns of increasing difficulty with shortness of breath for the past 2 to 3 weeks with recently worsening chronic cough.  He recently was started on doxycycline  on 11/30 for possible LRTI and then on ciprofloxacin  on 12/3 for urinary tract infection.  He has had some subjective fevers at home with associated chills.  CT scan of the chest yesterday showed multifocal bronchopneumonia with small right and moderate loculated left pleural effusion with some atelectasis.  He was started on CAP coverage with vancomycin  and cefepime .  Overnight noted to be increasingly hypotensive prompting need to initiate Levophed .  He also has positive blood cultures (4 out of 4) for gram-positive cocci which is very concerning in the setting of an indwelling left IJ TDC.    Objective Vital signs in last 24 hours: Vitals:   11/19/24 1215 11/19/24 1230 11/19/24 1245 11/19/24 1255  BP: (!) 85/44 (!) 93/45 (!) 90/50 (!) 89/53  Pulse: 89 90 91 90  Resp: (!) 23 20 (!) 26 (!) 25  Temp:      TempSrc:      SpO2: 96% 97% 97% 97%  Weight:      Height:        Dialysis Orders: MWF [facility] 4h  B400  101.9kg  2K bath  TDC  Hep 6000 - Hectoral IV q HD - No ESA/iron   Physical Exam:        General adult male, less responsive today Mild ^wob c/t yesterday Lungs clear to auscultation  Heart S1S2 no rub Abdomen soft nontender obese habitus Extremities right BKA; diffuse 2+ edema of all  extremities Psych normal mood and affect Neuro as above, less alert  Access groin temp HD catheter in place    Assessment/ Plan:  # ESRD:  - s/p CRRT vol overload w/ shock 12/20-12/22. Now back on iHD.   - HD on MWF schedule here. Had extra HD Sat.  - levo up 7-11 micrograms/min today - palliative care have met w/ family, consensus per family is do everything we can, have d/w CCM md - next HD tomorrow  # Hypervolemia:  - diffuse edema, 3rd spacing - got 3-3.5 L each HD last week, but vol remains up   - up 15kg by wts - fluid restricted as per renal diet  # Anemia of ESRD:    - s/p PRBC - iron  saturation 8%, S/p IV ferric gluconate 250mg  x 1 on 12/25, will give another 250mg  w/ HD x 1 more dose monday - After hb peaked we paused aranesp  (was 60mcg every thurs, last dose 12/25). Hb 13 now, cont to hold.   # Septic/cardiogenic shock due to MRSA bacteremia:  - Abx per primary team - on vanc - pressors per primary team - [name] was removed, and currently are using temporary HD line - pt remains too ill to go off the floor for new [name] w/ IR  # Hypotension/shock:  - may be getting a new infection, MS is poorer today - On midodrine  20 mg 3 times daily - levo per  primary team  - Fludrocortisone  was added by ICU team as well as strattera .    # Acute hypoxic respiratory failure due to fluid overload, acute systolic CHF and pneumonia:  - optimize volume status with HD - on antibiotics per PCCM.  #Secondary HPTH:   - phos ok but lower than goal - we reduced sevelamer  to 800 mg TID with meals (was on 3200 mg TID as of 12/29 AM and phos was low) - paused cinacalcet  for hypocalcemia. Reassess resuming at reduced dose in the future  [name] Geralynn  MD  [facility] 11/20/2024, 3:15 PM  Recent Labs  Lab 11/19/24 0102 11/20/24 0237 11/20/24 1223 11/20/24 1251  HGB 10.8* 11.3* 13.6 13.3  ALBUMIN  3.5 3.5  --   --   CALCIUM  8.3* 8.7*  --   --   PHOS 3.1 3.2  --   --   CREATININE 5.07* 4.64*  --    --   K 4.2 4.5 4.7 4.2    Inpatient medications:  aspirin  EC  81 mg Oral Q breakfast   atomoxetine   20 mg Oral BID   Chlorhexidine  Gluconate Cloth  6 each Topical Q0600   feeding supplement  237 mL Oral Q24H   guaiFENesin   600 mg Oral BID   heparin   5,000 Units Subcutaneous Q8H   insulin  aspart  0-15 Units Subcutaneous TID WC   insulin  aspart  4 Units Subcutaneous TID WC   insulin  glargine  8 Units Subcutaneous Daily   midodrine   20 mg Oral Q8H   multivitamin  1 tablet Oral QHS   pantoprazole   40 mg Oral Daily   sevelamer  carbonate  800 mg Oral TID WC    sodium chloride      anticoagulant sodium citrate      [START ON 11/21/2024] ferric gluconate (FERRLECIT) IVPB     norepinephrine  (LEVOPHED ) Adult infusion 7 mcg/min (11/20/24 1400)   phenylephrine  (NEO-SYNEPHRINE) Adult infusion 120 mcg/min (11/20/24 1400)   piperacillin -tazobactam (ZOSYN )  IV 100 mL/hr at 11/20/24 1400   vancomycin  Stopped (11/18/24 1801)   vasopressin      Place/Maintain arterial line **AND** sodium chloride , acetaminophen  **OR** acetaminophen , alteplase , anticoagulant sodium citrate , guaiFENesin -dextromethorphan , heparin , heparin , heparin , hydrOXYzine , ipratropium-albuterol , lidocaine  (PF), lidocaine -prilocaine , mouth rinse, pentafluoroprop-tetrafluoroeth, phenol, polyethylene glycol, trimethobenzamide 

## 2024-11-20 NOTE — Progress Notes (Signed)
 Patient adamantly refusing arterial line placement despite education about the necessity of and reasons for the procedure.

## 2024-11-20 NOTE — Progress Notes (Signed)
 1st ABG results were venous. 2nd ABG results drawn from arterial line and shown to Dr Pleas.

## 2024-11-21 DIAGNOSIS — I5022 Chronic systolic (congestive) heart failure: Secondary | ICD-10-CM | POA: Diagnosis not present

## 2024-11-21 DIAGNOSIS — A4102 Sepsis due to Methicillin resistant Staphylococcus aureus: Secondary | ICD-10-CM | POA: Diagnosis not present

## 2024-11-21 DIAGNOSIS — I350 Nonrheumatic aortic (valve) stenosis: Secondary | ICD-10-CM | POA: Diagnosis not present

## 2024-11-21 DIAGNOSIS — Z794 Long term (current) use of insulin: Secondary | ICD-10-CM | POA: Diagnosis not present

## 2024-11-21 DIAGNOSIS — I132 Hypertensive heart and chronic kidney disease with heart failure and with stage 5 chronic kidney disease, or end stage renal disease: Secondary | ICD-10-CM | POA: Diagnosis not present

## 2024-11-21 DIAGNOSIS — I9589 Other hypotension: Secondary | ICD-10-CM | POA: Diagnosis not present

## 2024-11-21 DIAGNOSIS — N186 End stage renal disease: Secondary | ICD-10-CM | POA: Diagnosis not present

## 2024-11-21 DIAGNOSIS — J188 Other pneumonia, unspecified organism: Secondary | ICD-10-CM | POA: Diagnosis not present

## 2024-11-21 DIAGNOSIS — I4891 Unspecified atrial fibrillation: Secondary | ICD-10-CM | POA: Diagnosis not present

## 2024-11-21 DIAGNOSIS — E1122 Type 2 diabetes mellitus with diabetic chronic kidney disease: Secondary | ICD-10-CM | POA: Diagnosis not present

## 2024-11-21 DIAGNOSIS — R6521 Severe sepsis with septic shock: Secondary | ICD-10-CM | POA: Diagnosis not present

## 2024-11-21 LAB — RENAL FUNCTION PANEL
Albumin: 3.4 g/dL — ABNORMAL LOW (ref 3.5–5.0)
Anion gap: 23 — ABNORMAL HIGH (ref 5–15)
BUN: 27 mg/dL — ABNORMAL HIGH (ref 8–23)
CO2: 16 mmol/L — ABNORMAL LOW (ref 22–32)
Calcium: 9 mg/dL (ref 8.9–10.3)
Chloride: 91 mmol/L — ABNORMAL LOW (ref 98–111)
Creatinine, Ser: 5.61 mg/dL — ABNORMAL HIGH (ref 0.61–1.24)
GFR, Estimated: 11 mL/min — ABNORMAL LOW
Glucose, Bld: 117 mg/dL — ABNORMAL HIGH (ref 70–99)
Phosphorus: 4.7 mg/dL — ABNORMAL HIGH (ref 2.5–4.6)
Potassium: 5.2 mmol/L — ABNORMAL HIGH (ref 3.5–5.1)
Sodium: 129 mmol/L — ABNORMAL LOW (ref 135–145)

## 2024-11-21 LAB — COOXEMETRY PANEL
Carboxyhemoglobin: 2.4 % — ABNORMAL HIGH (ref 0.5–1.5)
Methemoglobin: <0.7 % (ref 0.0–1.5)
O2 Saturation: 73.5 %
Total hemoglobin: 11.3 g/dL — ABNORMAL LOW (ref 12.0–16.0)

## 2024-11-21 LAB — LACTIC ACID, PLASMA
Lactic Acid, Venous: >9 mmol/L (ref 0.5–1.9)
Lactic Acid, Venous: >9 mmol/L (ref 0.5–1.9)
Lactic Acid, Venous: 8.1 mmol/L (ref 0.5–1.9)
Lactic Acid, Venous: 7.3 mmol/L (ref 0.5–1.9)
Lactic Acid, Venous: 8.3 mmol/L (ref 0.5–1.9)

## 2024-11-21 LAB — CBC
HCT: 34.9 % — ABNORMAL LOW (ref 39.0–52.0)
Hemoglobin: 11.3 g/dL — ABNORMAL LOW (ref 13.0–17.0)
MCH: 26.2 pg (ref 26.0–34.0)
MCHC: 32.4 g/dL (ref 30.0–36.0)
MCV: 80.8 fL (ref 80.0–100.0)
Platelets: 258 K/uL (ref 150–400)
RBC: 4.32 MIL/uL (ref 4.22–5.81)
RDW: 25.7 % — ABNORMAL HIGH (ref 11.5–15.5)
WBC: 10.4 K/uL (ref 4.0–10.5)
nRBC: 0.2 % (ref 0.0–0.2)

## 2024-11-21 LAB — GLUCOSE, CAPILLARY
Glucose-Capillary: 102 mg/dL — ABNORMAL HIGH (ref 70–99)
Glucose-Capillary: 98 mg/dL (ref 70–99)
Glucose-Capillary: 84 mg/dL (ref 70–99)
Glucose-Capillary: 102 mg/dL — ABNORMAL HIGH (ref 70–99)
Glucose-Capillary: 98 mg/dL (ref 70–99)

## 2024-11-21 LAB — HEPATIC FUNCTION PANEL
ALT: 17 U/L (ref 0–44)
AST: 26 U/L (ref 15–41)
Albumin: 3.4 g/dL — ABNORMAL LOW (ref 3.5–5.0)
Alkaline Phosphatase: 164 U/L — ABNORMAL HIGH (ref 38–126)
Bilirubin, Direct: 2.1 mg/dL — ABNORMAL HIGH (ref 0.0–0.2)
Indirect Bilirubin: 1 mg/dL — ABNORMAL HIGH (ref 0.3–0.9)
Total Bilirubin: 3.1 mg/dL — ABNORMAL HIGH (ref 0.0–1.2)
Total Protein: 8.4 g/dL — ABNORMAL HIGH (ref 6.5–8.1)

## 2024-11-21 LAB — HEPATITIS B SURFACE ANTIGEN: Hepatitis B Surface Ag: NONREACTIVE

## 2024-11-21 LAB — PROCALCITONIN: Procalcitonin: 1.95 ng/mL

## 2024-11-21 LAB — MAGNESIUM: Magnesium: 2 mg/dL (ref 1.7–2.4)

## 2024-11-21 MED ORDER — MIDODRINE HCL 5 MG PO TABS
10.0000 mg | ORAL_TABLET | Freq: Once | ORAL | Status: AC
  Administered 2024-11-21: 10 mg via ORAL
  Filled 2024-11-21: qty 2

## 2024-11-21 MED ORDER — HEPARIN SODIUM (PORCINE) 1000 UNIT/ML DIALYSIS
1000.0000 [IU] | INTRAMUSCULAR | Status: DC | PRN
  Administered 2024-11-21 – 2024-11-24 (×2): 2800 [IU]

## 2024-11-21 MED ORDER — HEPARIN SODIUM (PORCINE) 1000 UNIT/ML DIALYSIS
4000.0000 [IU] | Freq: Once | INTRAMUSCULAR | Status: AC
  Administered 2024-11-21: 4000 [IU] via INTRAVENOUS_CENTRAL

## 2024-11-21 MED ORDER — NEPRO/CARBSTEADY PO LIQD: 237.0000 mL | ORAL | Status: DC | PRN

## 2024-11-21 MED ORDER — HEPARIN SODIUM (PORCINE) 1000 UNIT/ML IJ SOLN
INTRAMUSCULAR | Status: AC
  Filled 2024-11-21: qty 9

## 2024-11-21 MED ORDER — ANTICOAGULANT SODIUM CITRATE 4% (200MG/5ML) IV SOLN: 5.0000 mL | Status: DC | PRN

## 2024-11-21 MED ORDER — HEPARIN SODIUM (PORCINE) 1000 UNIT/ML IJ SOLN
INTRAMUSCULAR | Status: AC
  Filled 2024-11-21: qty 2

## 2024-11-21 MED ORDER — ALBUMIN HUMAN 25 % IV SOLN
25.0000 g | Freq: Once | INTRAVENOUS | Status: AC
  Administered 2024-11-21: 25 g via INTRAVENOUS
  Filled 2024-11-21: qty 100

## 2024-11-21 MED ORDER — LIDOCAINE HCL (PF) 1 % IJ SOLN: 5.0000 mL | INTRAMUSCULAR | Status: DC | PRN

## 2024-11-21 MED ORDER — PENTAFLUOROPROP-TETRAFLUOROETH EX AERO: 1.0000 | INHALATION_SPRAY | CUTANEOUS | Status: DC | PRN

## 2024-11-21 MED ORDER — LIDOCAINE-PRILOCAINE 2.5-2.5 % EX CREA: 1.0000 | TOPICAL_CREAM | CUTANEOUS | Status: DC | PRN

## 2024-11-21 MED ORDER — HEPARIN SODIUM (PORCINE) 1000 UNIT/ML DIALYSIS
1500.0000 [IU] | INTRAMUSCULAR | Status: AC | PRN
  Administered 2024-11-21: 1500 [IU] via INTRAVENOUS_CENTRAL

## 2024-11-21 NOTE — Progress Notes (Signed)
" °   11/21/24 1909  Vitals  Temp 97.7 F (36.5 C)  Pulse Rate 91  Resp (!) 27  BP 113/69  SpO2 96 %  O2 Device Nasal Cannula  Weight 117.5 kg  Type of Weight Post-Dialysis  Oxygen Therapy  O2 Flow Rate (L/min) 2 L/min  Patient Activity (if Appropriate) In bed  Pulse Oximetry Type Continuous  Oximetry Probe Site Changed No  Post Treatment  Dialyzer Clearance Clear  Hemodialysis Intake (mL) 0 mL  Liters Processed 83.3  Fluid Removed (mL) 3500 mL  Tolerated HD Treatment Yes  Post-Hemodialysis Comments remains on Neo and vasopressing iv gtts   Pt tx done at bedside--56m07  Alert and oriented.  Informed consent signed and in chart.   TX duration:3.5  Patient tolerated well.  Alert, without acute distress.  Hand-off given to patient's nurse.   Access used: Left femoral trialysis Access issues: no complications  Total UF removed: 3500 Medication(s) given: albumin  25 grams iv x 1--mididrine 10mg  po x 1 [name] "

## 2024-11-21 NOTE — Progress Notes (Signed)
 [facility] NEPHROLOG[facility] PROGRESS NOTE   Subjective:   Seen in room, able to awaken and respond Lying flat    Presentation summary: 64 year old man with past medical history significant for hypertension, type 2 diabetes mellitus, status post right below-knee amputation, nonobstructive coronary artery disease, congestive heart failure with reduced ejection fraction, obesity and end-stage renal disease on hemodialysis.  Admitted to the hospital yesterday with concerns of increasing difficulty with shortness of breath for the past 2 to 3 weeks with recently worsening chronic cough.  He recently was started on doxycycline  on 11/30 for possible LRTI and then on ciprofloxacin  on 12/3 for urinary tract infection.  He has had some subjective fevers at home with associated chills.  CT scan of the chest yesterday showed multifocal bronchopneumonia with small right and moderate loculated left pleural effusion with some atelectasis.  He was started on CAP coverage with vancomycin  and cefepime .  Overnight noted to be increasingly hypotensive prompting need to initiate Levophed .  He also has positive blood cultures (4 out of 4) for gram-positive cocci which is very concerning in the setting of an indwelling left IJ TDC.    Objective Vital signs in last 24 hours: Vitals:   11/19/24 1215 11/19/24 1230 11/19/24 1245 11/19/24 1255  BP: (!) 85/44 (!) 93/45 (!) 90/50 (!) 89/53  Pulse: 89 90 91 90  Resp: (!) 23 20 (!) 26 (!) 25  Temp:      TempSrc:      SpO2: 96% 97% 97% 97%  Weight:      Height:        Dialysis Orders: MWF [facility] 4h  B400  101.9kg  2K bath  TDC  Hep 6000 - Hectoral IV q HD - No ESA/iron   Physical Exam:        General responding today, lying flat, no ^wob Lungs clear to auscultation  Heart S1S2 no rub Abdomen soft nontender obese habitus Ext diffuse 1-2+ edema x 4 ext, R bka Neuro more alert  Access groin temp HD catheter in place    Assessment/ Plan:  #  ESRD:  - s/p CRRT 2/20-12/22, now back on iHD - HD on MWF schedule here, had extra HD Sat  - levo off, neo gtt at 260 micrograms/min, vaso off - palliative care met w/ family, consensus per family is do everything you can - next HD today   # Septic shock due to MRSA bacteremia - f/u blood cx's from 12/18 were negative - needs better condition for ID to place tdc  - cont IV Vanc   # Hypervolemia:  - diffuse 1-2+ edema - got 3-3.5 L each HD last week, still up - fluid restricted as per renal diet  # Anemia of ESRD:    - s/p PRBC - iron  saturation 8%, S/p IV ferric gluconate 250mg  x 1 on 12/25, will give another 250mg  w/ HD x 1 more dose monday - After hb peaked we paused aranesp  (was 60mcg every thurs, last dose 12/25); Hb 13 now, cont to hold  # Hypotension/shock  - getting neo gtt at 260 + midodrine  20 mg 3 times daily - fludrocortisone  added by ICU team as well as strattera   # Acute hypoxic respiratory failure d/t fluid overload, acute syst CHF + pna  - optimize volume status with HD - on antibiotics per PCCM  #Secondary HPTH:   - phos ok but lower than goal - we reduced sevelamer  to 800 mg TID with meals (was on 3200 mg  TID as of 12/29 AM and phos was low) - paused cinacalcet  for hypocalcemia. Reassess resuming at reduced dose in the future  Rob Geralynn  MD  [facility] 11/21/2024, 1:09 PM  Recent Labs  Lab 11/20/24 0237 11/20/24 1223 11/20/24 1251 11/21/24 0351  HGB 11.3*   < > 13.3 11.3*  ALBUMIN  3.5  --   --  3.4*  CALCIUM  8.7*  --   --  9.0  PHOS 3.2  --   --  4.7*  CREATININE 4.64*  --   --  5.61*  K 4.5   < > 4.2 5.2*   < > = values in this interval not displayed.    Inpatient medications:  aspirin  EC  81 mg Oral Q breakfast   atomoxetine   20 mg Oral BID   Chlorhexidine  Gluconate Cloth  6 each Topical Q0600   Chlorhexidine  Gluconate Cloth  6 each Topical Q0600   feeding supplement  237 mL Oral Q24H   guaiFENesin   600 mg Oral BID   heparin   5,000 Units  Subcutaneous Q8H   insulin  aspart  0-15 Units Subcutaneous TID WC   insulin  aspart  4 Units Subcutaneous TID WC   midodrine   20 mg Oral Q8H   multivitamin  1 tablet Oral QHS   pantoprazole   40 mg Oral Daily   sevelamer  carbonate  800 mg Oral TID WC    anticoagulant sodium citrate      ferric gluconate (FERRLECIT) IVPB     norepinephrine  (LEVOPHED ) Adult infusion Stopped (11/20/24 1551)   phenylephrine  (NEO-SYNEPHRINE) Adult infusion 200 mcg/min (11/21/24 1200)   piperacillin -tazobactam (ZOSYN )  IV 100 mL/hr at 11/21/24 1200   vancomycin  Stopped (11/18/24 1801)   vasopressin  0.03 Units/min (11/21/24 1200)   acetaminophen  **OR** acetaminophen , alteplase , anticoagulant sodium citrate , guaiFENesin -dextromethorphan , heparin , heparin , hydrOXYzine , ipratropium-albuterol , lidocaine  (PF), lidocaine -prilocaine , mouth rinse, pentafluoroprop-tetrafluoroeth, phenol, polyethylene glycol, trimethobenzamide 

## 2024-11-21 NOTE — Progress Notes (Signed)
 PT Cancellation Note  Patient Details Name: [name] [name]. MRN: [id number] DOB: 02-28-1961   Cancelled Treatment:    Reason Eval/Treat Not Completed: Patient at procedure or test/unavailable  Undergoing HD. Will plan to check on patient tomorrow.  Leontine Roads, PT, DPT [facility] Physical Therapist Office: [phone] Website: [email or web address]   Leontine GORMAN Roads 11/21/2024, 4:44 PM

## 2024-11-21 NOTE — Progress Notes (Signed)
 Checked in with [facility] admissions regarding status of HD clinic transfer, no update at this time. Will provide one once available.   Also noted that pt is being monitored if he can tolerate out-pt HD. Following along, will assist as needed.   Lavanda Dorthula Bier Dialysis Navigator [phone]

## 2024-11-21 NOTE — Progress Notes (Addendum)
 "  NAME:  [name] Westmoreland., MRN:  [id number], DOB:  17-Oct-1961, LOS: 28 ADMISSION DATE:  10/24/2024, CONSULTATION DATE:  12/17 REFERRING MD:  Ricky - [facility], CHIEF COMPLAINT:  hypotension  History of Present Illness:  [name] SIEDLECKI Sr. is a 64 y.o. male who has a PMH as outlined below including but not limited to ESRD on HD MWF, A-fib, systolic CHF, hypertension, LBBB, DM.  He presented to [facility] ED on 10/24/2024 with dyspnea x roughly 2-3 weeks. He had been seen in the ED a few days early on 12/2 for dysuria and was started on a course of Cipro  which she started the following day and stated that he had been taking as prescribed.     CT chest that demonstrated multifocal pneumonia and small effusions.  Blood cultures ultimately grew MRSA.  He had been on vancomycin  which was continued. He had a left thora 12/10 with 450cc fluid removed.  His HD catheter was removed on 10/28/2024 as well as an arterial line.  He did have a TTE on 12/11 that showed an EF of 20-25%, G2 DD, mild decrease in RV SF, severe AAS, no vegetations.  TEE was recommended but had been deferred due to patient requiring vasopressor support.  ID was consulted and recommended line holiday.  He had a temporary femoral HD cath placed on 12/15.  ID recommendations included 6 weeks of vancomycin  to end on 12/09/2024.    12/17, he was transferred to [facility] for ongoing management for septic shock as well as for IR evaluation for tunneled HD catheter placement  Pertinent  Medical History  ESRD on HD MWF, Afib, HFrEF, T2DM, LBBB, severe PVD s/p R BKA Past Medical History:  Diagnosis Date   ESRD on hemodialysis (HCC)    a. HD MWF > 10 years.   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 01/2022 Echo: EF 45-50%, glob HK, mod LVH, mildly reduced RV fxn, mild MR, mod-sev MV Ca2+. Mild AS; b. 10/2023 Echo: EF 45-50%, glob HK, GrII DD, mildly reduced RV fxn, RVSP 52.56mmHg. Mild MR/MS, mod TR.   Hypertension    Nonischemic  cardiomyopathy (HCC)    a. 2019 Cath [facility]): Nonobstructive disease; b. 12/2019 Echo: EF 50%; c. 12/2019 MV (Tx w/u [facility]): Ant infarct w/ peri-infarct ischemia. EF 35%; c. 05/2021 MV (Tx w/u [facility]): Cor Ca2+, EF 53%, motion artifact, equiv study, likely low risk.   Nonobstructive coronary artery disease    a.  2019 Cath ([facility]): Left main normal, LAD 30 proximal, RCA 30 distal, EF 25%.   Obesity    Peripheral arterial disease    Peripheral vascular disease    Type 2 diabetes mellitus (HCC)    a. 10/2023 - admission w/ HHS.   Significant Hospital Events: Including procedures, antibiotic start and stop dates in addition to other pertinent events   12/8 admit 12/10 L thora with 450cc removed, transudate, neg cultures 12/11 echo 12/12 HD cath and art line removed 12/15 new femoral HD cath placed 12/17 transfer to [facility] 80M 12/19 HD  12/20 CRRT 12/23 Weaned off levophed  this am. HD trial this am. Transferred to [facility] 12/25 Returned to ICU for hypotension 12/28-remains on Levophed  12/29-remains on Levophed , tolerating dialysis 12/31 was off Levophed  this morning 1/2 on Levophed  and had HD  Interim History / Subjective:  Awake and alert. Denies nausea/vomiting. Denies abd pain. Appetite decreased today and not wanting to eat breakfast.  NSR On 4L  Valinda Off levophed , on vasopressin  and phenylephrine    Objective    Blood pressure 116/62, pulse 91, temperature 99 F (37.2 C), temperature source Axillary, resp. rate (!) 23, height 6' 1 (1.854 m), weight 121 kg, SpO2 99%.    FiO2 (%):  [40 %-60 %] 40 %   Intake/Output Summary (Last 24 hours) at 11/21/2024 9357 Last data filed at 11/21/2024 0600 Gross per 24 hour  Intake 1773.1 ml  Output --  Net 1773.1 ml   Filed Weights   11/19/24 1305 11/20/24 0415 11/21/24 0500  Weight: 119.4 kg 119.1 kg 121 kg   Physical Exam: General: Middle-age, alert, does not appear to be in distress Respiratory: Normal effort on room air.  Clear breath sounds bilaterally Cardiovascular: RRR GI: BS +, soft, NT Extremities: Right below-knee amputation, 2+ pitting edema in left lower extremity neuro: Awake, alert and oriented  Lab data reviewed Na 127--129 (chronic/stable) Hgb 10.8 (stable)  Resolved problem list   Assessment and Plan   Chronic hypotension - On midodrine  20 mg TID, Strattera  20 mg BID - Now on phenylephrine  (off levophed ) - Has femoral a-line  - Will check lactic acid, procal and repeat Bcx  Multifocal pneumonia Hypoxemic respiratory failure, resolved -Completed 5 days of CTX -Continue duoneb PRN -Wean supplemental O2 as tolerated, SpO2 goal >92% -New fever, suspect new pneumonia, started on Zosyn  (1/4-  )  Afib w/ RVR Suspect from new sepsis suspect aspiration PNA.  -On phenylephrine , stopped levophed  -Zosyn  as above -Appears to be in NSR, HR controlled at this time  Heart failure with reduced ejection fraction of 20 to 25% Severe aortic stenosis - Not on GDMT due to hypotension  - Follows w/ outpatient cardiology, medical management - Volume removal per nephrology  Septic shock secondary to MRSA bacteremia - Goal is to complete vancomycin  dosing on 12/09/2024 - Has temporary dialysis catheter in place - Plan for [facility] per nephrology and IR next week  - Unable to complete TEE at this time  End-stage renal disease on hemodialysis - Appreciate nephrology assistance, HD per nephrology  - Home Cinacalcet  and sevelamer  - Has temp HD catheter, will need TDC (spoke w/ Dr. Jerrye, nephrology to speak w/ IR)  Type 2 diabetes CBG goal 140-180, at goal mostly.  - SSI-M  - Novolog  3 units TID w/ meals - Lantus  8 units daily   Peripheral vascular disease - On aspirin   GOC Palliative following. Discussion w/ pt and pt's daughter who is POA. PMT and CCM expressed concern to both how pt may not tolerate HD outpatient. He remains in ICU due to needs for pressor. Both aware of this but remains full  code and scope.    Labs   CBC: Recent Labs  Lab 11/16/24 0436 11/18/24 0500 11/19/24 0102 11/20/24 0237 11/20/24 1223 11/20/24 1251 11/21/24 0351  WBC 7.4 7.4 7.4 9.6  --   --  10.4  HGB 10.8* 10.8* 10.8* 11.3* 13.6 13.3 11.3*  HCT 32.5* 32.6* 32.4* 34.2* 40.0 39.0 34.9*  MCV 77.9* 78.7* 78.5* 78.3*  --   --  80.8  PLT 231 254 270 293  --   --  258    Basic Metabolic Panel: Recent Labs  Lab 11/17/24 0339 11/18/24 0500 11/19/24 0102 11/20/24 0237 11/20/24 1223 11/20/24 1251 11/21/24 0351  NA 128* 127* 129* 131* 132* 131* 129*  K 4.4 4.8 4.2 4.5 4.7 4.2 5.2*  CL 92* 92* 91* 93*  --   --  91*  CO2 24 20*  25 25  --   --  16*  GLUCOSE 126* 177* 213* 201*  --   --  117*  BUN 26* 31* 25* 21  --   --  27*  CREATININE 5.00* 5.80* 5.07* 4.64*  --   --  5.61*  CALCIUM  8.0* 7.6* 8.3* 8.7*  --   --  9.0  MG 2.0 1.9 2.0 2.1  --   --  2.0  PHOS 3.0 3.4 3.1 3.2  --   --  4.7*   GFR: Estimated Creatinine Clearance: 18.6 mL/min (A) (by C-G formula based on SCr of 5.61 mg/dL (H)). Recent Labs  Lab 11/18/24 0500 11/19/24 0102 11/20/24 0237 11/21/24 0351  WBC 7.4 7.4 9.6 10.4    Liver Function Tests: Recent Labs  Lab 11/17/24 0339 11/18/24 0500 11/19/24 0102 11/20/24 0237 11/21/24 0351  ALBUMIN  3.0* 2.7* 3.5 3.5 3.4*   No results for input(s): LIPASE, AMYLASE in the last 168 hours. No results for input(s): AMMONIA in the last 168 hours.  ABG    Component Value Date/Time   PHART 7.449 11/20/2024 1251   PCO2ART 31.5 (L) 11/20/2024 1251   PO2ART 62 (L) 11/20/2024 1251   HCO3 21.9 11/20/2024 1251   TCO2 23 11/20/2024 1251   ACIDBASEDEF 1.0 11/20/2024 1251   O2SAT 93 11/20/2024 1251     Coagulation Profile: No results for input(s): INR, PROTIME in the last 168 hours.  Cardiac Enzymes: No results for input(s): CKTOTAL, CKMB, CKMBINDEX, TROPONINI in the last 168 hours.  HbA1C: Hgb A1c MFr Bld  Date/Time Value Ref Range Status  10/24/2024  10:14 AM 8.6 (H) 4.8 - 5.6 % Final    Comment:    (NOTE) Diagnosis of Diabetes The following HbA1c ranges recommended by the American Diabetes Association (ADA) may be used as an aid in the diagnosis of diabetes mellitus.  Hemoglobin             Suggested A1C NGSP%              Diagnosis  <5.7                   Non Diabetic  5.7-6.4                Pre-Diabetic  >6.4                   Diabetic  <7.0                   Glycemic control for                       adults with diabetes.    02/11/2024 02:27 PM 8.4 (H) 4.8 - 5.6 % Final    Comment:    (NOTE) Pre diabetes:          5.7%-6.4%  Diabetes:              >6.4%  Glycemic control for   <7.0% adults with diabetes     CBG: Recent Labs  Lab 11/19/24 2102 11/20/24 0731 11/20/24 1123 11/20/24 1542 11/20/24 2114  GLUCAP 153* 184* 147* 130* 114*      "

## 2024-11-22 ENCOUNTER — Inpatient Hospital Stay (HOSPITAL_COMMUNITY)

## 2024-11-22 DIAGNOSIS — N186 End stage renal disease: Secondary | ICD-10-CM | POA: Diagnosis not present

## 2024-11-22 DIAGNOSIS — J188 Other pneumonia, unspecified organism: Secondary | ICD-10-CM | POA: Diagnosis not present

## 2024-11-22 DIAGNOSIS — R578 Other shock: Secondary | ICD-10-CM | POA: Diagnosis not present

## 2024-11-22 DIAGNOSIS — R6521 Severe sepsis with septic shock: Secondary | ICD-10-CM | POA: Diagnosis not present

## 2024-11-22 DIAGNOSIS — I4891 Unspecified atrial fibrillation: Secondary | ICD-10-CM | POA: Diagnosis not present

## 2024-11-22 DIAGNOSIS — E1122 Type 2 diabetes mellitus with diabetic chronic kidney disease: Secondary | ICD-10-CM | POA: Diagnosis not present

## 2024-11-22 DIAGNOSIS — I132 Hypertensive heart and chronic kidney disease with heart failure and with stage 5 chronic kidney disease, or end stage renal disease: Secondary | ICD-10-CM | POA: Diagnosis not present

## 2024-11-22 DIAGNOSIS — Z515 Encounter for palliative care: Secondary | ICD-10-CM | POA: Diagnosis not present

## 2024-11-22 DIAGNOSIS — I5022 Chronic systolic (congestive) heart failure: Secondary | ICD-10-CM | POA: Diagnosis not present

## 2024-11-22 DIAGNOSIS — I9589 Other hypotension: Secondary | ICD-10-CM | POA: Diagnosis not present

## 2024-11-22 DIAGNOSIS — Z794 Long term (current) use of insulin: Secondary | ICD-10-CM | POA: Diagnosis not present

## 2024-11-22 DIAGNOSIS — I350 Nonrheumatic aortic (valve) stenosis: Secondary | ICD-10-CM | POA: Diagnosis not present

## 2024-11-22 DIAGNOSIS — Z7189 Other specified counseling: Secondary | ICD-10-CM | POA: Diagnosis not present

## 2024-11-22 DIAGNOSIS — A4102 Sepsis due to Methicillin resistant Staphylococcus aureus: Secondary | ICD-10-CM | POA: Diagnosis not present

## 2024-11-22 LAB — MAGNESIUM: Magnesium: 2 mg/dL (ref 1.7–2.4)

## 2024-11-22 LAB — RENAL FUNCTION PANEL
Albumin: 3.6 g/dL (ref 3.5–5.0)
Albumin: 3.6 g/dL (ref 3.5–5.0)
Anion gap: 26 — ABNORMAL HIGH (ref 5–15)
Anion gap: 25 — ABNORMAL HIGH (ref 5–15)
BUN: 25 mg/dL — ABNORMAL HIGH (ref 8–23)
BUN: 30 mg/dL — ABNORMAL HIGH (ref 8–23)
CO2: 15 mmol/L — ABNORMAL LOW (ref 22–32)
CO2: 14 mmol/L — ABNORMAL LOW (ref 22–32)
Calcium: 8.8 mg/dL — ABNORMAL LOW (ref 8.9–10.3)
Calcium: 8.8 mg/dL — ABNORMAL LOW (ref 8.9–10.3)
Chloride: 92 mmol/L — ABNORMAL LOW (ref 98–111)
Chloride: 91 mmol/L — ABNORMAL LOW (ref 98–111)
Creatinine, Ser: 4.75 mg/dL — ABNORMAL HIGH (ref 0.61–1.24)
Creatinine, Ser: 5.07 mg/dL — ABNORMAL HIGH (ref 0.61–1.24)
GFR, Estimated: 13 mL/min — ABNORMAL LOW
GFR, Estimated: 12 mL/min — ABNORMAL LOW
Glucose, Bld: 86 mg/dL (ref 70–99)
Glucose, Bld: 125 mg/dL — ABNORMAL HIGH (ref 70–99)
Phosphorus: 4.1 mg/dL (ref 2.5–4.6)
Phosphorus: 4.1 mg/dL (ref 2.5–4.6)
Potassium: 4.6 mmol/L (ref 3.5–5.1)
Potassium: 4.6 mmol/L (ref 3.5–5.1)
Sodium: 132 mmol/L — ABNORMAL LOW (ref 135–145)
Sodium: 130 mmol/L — ABNORMAL LOW (ref 135–145)

## 2024-11-22 LAB — ECHOCARDIOGRAM COMPLETE
AR max vel: 0.67 cm2
AV Area VTI: 0.88 cm2
AV Area mean vel: 0.67 cm2
AV Mean grad: 38 mmHg
AV Peak grad: 52.4 mmHg
Ao pk vel: 3.62 m/s
Height: 73 in
MV M vel: 4.67 m/s
MV Peak grad: 87.2 mmHg
S' Lateral: 5 cm
Single Plane A2C EF: 25 %
Weight: 4155.23 [oz_av]

## 2024-11-22 LAB — GAMMA GT: GGT: 67 U/L — ABNORMAL HIGH (ref 7–50)

## 2024-11-22 LAB — GLUCOSE, CAPILLARY
Glucose-Capillary: 91 mg/dL (ref 70–99)
Glucose-Capillary: 102 mg/dL — ABNORMAL HIGH (ref 70–99)
Glucose-Capillary: 132 mg/dL — ABNORMAL HIGH (ref 70–99)
Glucose-Capillary: 132 mg/dL — ABNORMAL HIGH (ref 70–99)

## 2024-11-22 LAB — LACTIC ACID, PLASMA
Lactic Acid, Venous: 6.3 mmol/L (ref 0.5–1.9)
Lactic Acid, Venous: 5.7 mmol/L (ref 0.5–1.9)

## 2024-11-22 LAB — HEPATIC FUNCTION PANEL
ALT: 18 U/L (ref 0–44)
AST: 30 U/L (ref 15–41)
Albumin: 3.5 g/dL (ref 3.5–5.0)
Alkaline Phosphatase: 153 U/L — ABNORMAL HIGH (ref 38–126)
Bilirubin, Direct: 2.3 mg/dL — ABNORMAL HIGH (ref 0.0–0.2)
Indirect Bilirubin: 1.1 mg/dL — ABNORMAL HIGH (ref 0.3–0.9)
Total Bilirubin: 3.4 mg/dL — ABNORMAL HIGH (ref 0.0–1.2)
Total Protein: 8.4 g/dL — ABNORMAL HIGH (ref 6.5–8.1)

## 2024-11-22 MED ORDER — HEPARIN SODIUM (PORCINE) 1000 UNIT/ML DIALYSIS
1000.0000 [IU] | INTRAMUSCULAR | Status: DC | PRN
  Administered 2024-11-30: 3000 [IU] via INTRAVENOUS_CENTRAL
  Filled 2024-11-22: qty 3
  Filled 2024-11-22: qty 6

## 2024-11-22 MED ORDER — VANCOMYCIN VARIABLE DOSE PER UNSTABLE RENAL FUNCTION (PHARMACIST DOSING): Status: DC

## 2024-11-22 MED ORDER — PRISMASOL BGK 4/2.5 32-4-2.5 MEQ/L EC SOLN: Status: DC

## 2024-11-22 MED ORDER — PIPERACILLIN-TAZOBACTAM 3.375 G IVPB
3.3750 g | Freq: Four times a day (QID) | INTRAVENOUS | Status: AC
  Administered 2024-11-22 – 2024-11-24 (×9): 3.375 g via INTRAVENOUS
  Filled 2024-11-22 (×9): qty 50

## 2024-11-22 MED ORDER — MIRTAZAPINE 15 MG PO TABS
7.5000 mg | ORAL_TABLET | Freq: Every day | ORAL | Status: DC
  Administered 2024-11-22 – 2024-11-29 (×8): 7.5 mg via ORAL
  Filled 2024-11-22 (×8): qty 1

## 2024-11-22 NOTE — Progress Notes (Addendum)
 Nutrition Follow-up  DOCUMENTATION CODES:  Not applicable  INTERVENTION:  Continue current diet as ordered 1.2L fluid restriction remains in place Continue Ensure Plus High Protein po daily, each supplement provides 350 kcal and 20 grams of protein  Renal MVI with minerals daily Daily weights to assess volume overload and nutrition status If indicated in GOC and pt continues to refuse to eat, recommend placement of cortrak tube and initiation of enteral feeds.  NUTRITION DIAGNOSIS:  Increased nutrient needs related to chronic illness as evidenced by estimated needs. - remains applicable  GOAL:  Patient will meet greater than or equal to 90% of their needs - progressing  MONITOR:  PO intake, Supplement acceptance, Labs, Weight trends  REASON FOR ASSESSMENT:  Consult Assessment of nutrition requirement/status (CRRT)  ASSESSMENT:  Pt with PMH significant for: ESRD-HD, afib, HFrEF, HTN, T2DM, CAD, severe PVD s/p R BKA. Presented to [facility] ED (12/08) and found to have multifocal PNA on CT and small effusions. Bcx positive for MRSA. Transferred to [facility] thirteen days after admission for management of septic shock and IR eval for tunneled HD catheter placement.  12/08 - admitted 12/10 - L thoracentesis: yield 12/11 - echo: EF 20-25% 12/12 - line holiday 12/15 - new femoral HD cath placed 12/17 - txr to [facility] ICU 12/20 - CRRT initiated 12/21 - a-line placed  12/22 - off CRRT 12/23 - HD trial 1/6 - CRRT to be resumed due to pressor requirements with HD  Pt resting in bed at the time of assessment. Lactic acid high 1/5 and remains elevated at this time with slight improvement over the last 24 hours.  Noted that pt's intake markedly decreased since 1/4. Previously had good intake of meals and was meeting his needs orally. Discussed in rounds, pt with flat affect and depressed mood which is the driving factor behind poor PO intake at this time. Discussed with resident that if  pt continues to refuse to eat, cortrak will be needed. Mirtazapine  stoart in hopes it will stimulate appetite. Ongoing GOC discussions with pt's daughter - requesting time for outcomes at this time.    Meal completions: 12/21: 10% x1 documented offering 12/22: 75-100% x2 documented offerings 12/23: 100% x2 documented meals 12/24-12/29: 95% average intake x 8 recorded meals 12/30-1/6: 45% average intake x 5 recorded meals    Admit Weight: 104.3 kg Current Weight: 117.5 kg EDW: 102 kg   Dialysis Orders: MWF [facility] 4h  B400  101.9kg  2K bath  TDC  Hep 6000 - Hectoral 5mcg IV q HD - No ESA/iron     Medications:  Scheduled Meds:  atomoxetine   20 mg Oral BID   feeding supplement  237 mL Oral Q24H   insulin  aspart  0-15 Units Subcutaneous TID WC   insulin  aspart  4 Units Subcutaneous TID WC   mirtazapine   7.5 mg Oral QHS   multivitamin  1 tablet Oral QHS   pantoprazole   40 mg Oral Daily   sevelamer  carbonate  800 mg Oral TID WC   Continuous Infusions:  phenylephrine  (NEO-SYNEPHRINE) Adult infusion 400 mcg/min (11/22/24 0900)   piperacillin -tazobactam (ZOSYN )  IV Stopped (11/22/24 0609)   vancomycin  150 mL/hr at 11/22/24 0900   vasopressin  0.03 Units/min (11/22/24 0927)   PRN Meds: phenol, polyethylene glycol  Labs:  Sodium 132, chloride 92 BUN 25, creatinine 4.75 CBG ranges from 84-102 mg/dL over the last 24 hours HgbA1c 8.6% (12/8)  NUTRITION - FOCUSED PHYSICAL EXAM: No significant muscle or fat depletions on exam.  Notably, could be skewed by body habitus and excessive adipose tissue as well as continued volume overload. Will benefit from follow up NFPE when edema improved.  Flowsheet Row Most Recent Value  Orbital Region No depletion  Upper Arm Region No depletion  Thoracic and Lumbar Region No depletion  Buccal Region No depletion  Temple Region No depletion  Clavicle Bone Region No depletion  Clavicle and Acromion Bone Region No depletion  Scapular Bone Region  Unable to assess  Dorsal Hand No depletion  Patellar Region Unable to assess  [edema]  Anterior Thigh Region Unable to assess  [edema]  Posterior Calf Region Unable to assess  [edema,  R BKA]  Edema (RD Assessment) Moderate  Hair Reviewed  Eyes Reviewed  Mouth Reviewed  Skin Reviewed  Nails Reviewed     Diet Order:   Diet Orders (From admission, onward)     Start     Ordered   11/06/24 1650  Diet Carb Modified Room service appropriate? Yes; Fluid restriction: 1200 mL Fluid  Diet effective now       Question Answer Comment  Diet-HS Snack? Nothing   Calorie Level Medium 1600-2000   Fluid consistency: Thin   Room service appropriate? Yes   Fluid restriction: 1200 mL Fluid      11/06/24 1653            EDUCATION NEEDS:  No education needs have been identified at this time  Skin:  Skin Assessment: Skin Integrity Issues: Skin Integrity Issues:: Stage II Stage II: coccyx  Last BM:  1/6 - type 6  Height:  Ht Readings from Last 1 Encounters:  10/24/24 6' 1 (1.854 m)   Weight:  Wt Readings from Last 1 Encounters:  11/22/24 117.8 kg   Ideal Body Weight:  78.6 kg (adjusted for R BKA)  BMI:  Body mass index is 34.26 kg/m.  Estimated Nutritional Needs:  Kcal:  2100-2300 kcals Protein:  105-120g Fluid:  1L + UOP   Vernell Lukes, RD, LDN, CNSC Registered Dietitian II Please reach out via secure chat

## 2024-11-22 NOTE — Progress Notes (Signed)
 [name] [name]   Subjective:   Seen in room, able to awaken and respond Lying flat    Presentation summary: 64 year old man with past medical history significant for hypertension, type 2 diabetes mellitus, status post right below-knee amputation, nonobstructive coronary artery disease, congestive heart failure with reduced ejection fraction, obesity and end-stage renal disease on hemodialysis.  Admitted to the hospital yesterday with SOB. CT scan of the chest showed multifocal bronchopneumonia with small right and moderate loculated left pleural effusion.  He was started on CAP coverage with vancomycin  and cefepime .  Overnight noted to be increasingly hypotensive prompting need to initiate Levophed .  He also has positive blood cultures (4 out of 4) for gram-positive cocci, which indwelling TDC.    Objective Vital signs in last 24 hours: Vitals:   11/19/24 1215 11/19/24 1230 11/19/24 1245 11/19/24 1255  BP: (!) 85/44 (!) 93/45 (!) 90/50 (!) 89/53  Pulse: 89 90 91 90  Resp: (!) 23 20 (!) 26 (!) 25  Temp:      TempSrc:      SpO2: 96% 97% 97% 97%  Weight:      Height:        Dialysis Orders: MWF [facility] 4h  B400  101.9kg  2K bath  TDC  Hep 6000 - Hectoral IV q HD - No ESA/iron   Physical Exam:        General responding today, lying flat, no ^wob Lungs clear to auscultation  Heart S1S2 no rub Abdomen soft nontender obese habitus Ext diffuse 1-2+ edema x 4 ext, R bka Neuro more alert  Access groin temp HD catheter in place    Assessment/ Plan:  # ESRD:  - s/p CRRT 2/20-12/22, now back on iHD - HD MWF here - had HD yest - severe shock now, will not be able to do iHD - plan is for return to CRRT    # Septic shock - improved initially from MRSA bacteremia - now severely hypotensive again - on 3 pressors today   # MRSA bacteremia - issue on admission - bcx's + 12/08, f/u cx's negative - TDC removed - temp line placed  12/15 - IR consulted but remains too sick for new TDC - cont IV abx per pmd  # Hypervolemia - diffuse 3rd spacing, UE/ LE edema  # Anemia of ESRD:    - s/p PRBC - iron  saturation 8%, gave IV ferric gluconate 250mg  x 2  - esa darbe at 60 mcg/wk on hold w/ Hb > 10-11  #Secondary HPTH:   - phos ok, not eating much now - paused cinacalcet  for hypocalcemia   [name]  MD  [facility] 11/22/2024, 1:09 PM  Recent Labs  Lab 11/20/24 1251 11/21/24 0351 11/21/24 1624 11/22/24 0457  HGB 13.3 11.3*  --   --   ALBUMIN   --  3.4* 3.4* 3.6  3.5  CALCIUM   --  9.0  --  8.8*  PHOS  --  4.7*  --  4.1  CREATININE  --  5.61*  --  4.75*  K 4.2 5.2*  --  4.6    Inpatient medications:  aspirin  EC  81 mg Oral Q breakfast   atomoxetine   20 mg Oral BID   Chlorhexidine  Gluconate Cloth  6 each Topical Q0600   Chlorhexidine  Gluconate Cloth  6 each Topical Q0600   feeding supplement  237 mL Oral Q24H   guaiFENesin   600 mg Oral BID   heparin   5,000 Units Subcutaneous  Q8H   insulin  aspart  0-15 Units Subcutaneous TID WC   insulin  aspart  4 Units Subcutaneous TID WC   midodrine   20 mg Oral Q8H   mirtazapine   7.5 mg Oral QHS   multivitamin  1 tablet Oral QHS   pantoprazole   40 mg Oral Daily   sevelamer  carbonate  800 mg Oral TID WC    anticoagulant sodium citrate      anticoagulant sodium citrate      norepinephrine  (LEVOPHED ) Adult infusion 6 mcg/min (11/22/24 1200)   phenylephrine  (NEO-SYNEPHRINE) Adult infusion 320 mcg/min (11/22/24 1200)   piperacillin -tazobactam (ZOSYN )  IV Stopped (11/22/24 1152)   vancomycin  Stopped (11/22/24 1034)   vasopressin  0.03 Units/min (11/22/24 1200)   acetaminophen  **OR** acetaminophen , alteplase , anticoagulant sodium citrate , anticoagulant sodium citrate , feeding supplement (NEPRO CARB STEADY), guaiFENesin -dextromethorphan , heparin , heparin , heparin , hydrOXYzine , ipratropium-albuterol , lidocaine  (PF), lidocaine  (PF), lidocaine -prilocaine , mouth rinse,  pentafluoroprop-tetrafluoroeth, pentafluoroprop-tetrafluoroeth, phenol, polyethylene glycol, trimethobenzamide 

## 2024-11-22 NOTE — Progress Notes (Signed)
 eLink Physician-Brief Progress Note Patient Name: [name] [name]. DOB: Jan 15, 1961 MRN: [id number]   Date of Service  11/22/2024  HPI/Events of Note  Persistent lactic acidosis, hemodynamics are unchanged, low-dose phenylephrine  infusion  eICU Interventions  Unclear source, no obvious ongoing hypoperfusion, tolerating renal replacement therapy with 3.5 L ultrafiltration. Maintain broad-spectrum antibiotics and supportive care for now     Intervention Category Minor Interventions: Clinical assessment - ordering diagnostic tests  Makynleigh Breslin 11/22/2024, 5:57 AM

## 2024-11-22 NOTE — Progress Notes (Signed)
 Echocardiogram 2D Echocardiogram has been performed.  Merlynn Argyle 11/22/2024, 1:23 PM

## 2024-11-22 NOTE — Progress Notes (Addendum)
 Discussion w/ pt's daughter Catering Manager) this afternoon. Discussed patient's condition and expressed again our concerns of his current status now requiring CRRT again and on 3 pressors. Daughter is aware and would like to see how he is over the course of next 4-5 days. Discussed code status and at this time, patient and pt's daughter would like to remain full code. Daughter is aware along w/ patient that his prognosis is poor and he likely will not be able to leave the ICU. They would like a few more days before the decision to change code status/current care. Palliative team has been following, appreciate their assistance.

## 2024-11-22 NOTE — Progress Notes (Signed)
 Patient continues to refuse to eat or take any fluid by mouth he does take his medications. Dr Pleas and Dr Geralynn have both been informed of his refusal to eat for 3 days now. His daughter has also tried to get him to eat and has even brought in food from outside the hospital that he also refuses to eat.

## 2024-11-22 NOTE — Progress Notes (Signed)
 Occupational Therapy Treatment Patient Details Name: [name] CORY Sr. MRN: [id number] DOB: 08-Dec-1960 Today's Date: 11/22/2024   History of present illness [name] TRIEU Sr. is a 63 y.o. male who presented to [facility] ED on 10/24/2024 with dyspnea x roughly 2-3 weeks. Pt placed on CRRT for volume overload, off 12/22. 12/25. Returned to ICU for hypotension. PMH: ESRD on HD MWF, A-fib, systolic CHF, hypertension, LBBB, DM.   OT comments  Pt seen for EOB activity with plan of getting to chair with lift. During cotx with pt after pt came to sitting position with max a x2, pt gaze became fixed with groaning and pt unable to follow commands. Mild rigidity noted. Pt returned to sidelying and was held there due to gagging motions. Pt had bowel movement during this episode and was unable to follow any commands. This lasted appx 1 minute when pt came to and acknowledged therapists in room. Pt was cleaned up and nursing and MD entered room to examine pt.  Will continue to see pt as able to focus on adls and increasing mobility.      If plan is discharge home, recommend the following:  Two people to help with walking and/or transfers;Two people to help with bathing/dressing/bathroom;Assistance with cooking/housework;Assist for transportation;Help with stairs or ramp for entrance   Equipment Recommendations  None recommended by OT    Recommendations for Other Services      Precautions / Restrictions Precautions Precautions: Fall;Other (comment) Recall of Precautions/Restrictions: Impaired Precaution/Restrictions Comments: L femoral line; R BKA, has prosthesis Restrictions Weight Bearing Restrictions Per Provider Order: No Other Position/Activity Restrictions: L femoral line       Mobility Bed Mobility Overal bed mobility: Needs Assistance Bed Mobility: Rolling, Sidelying to Sit, Sit to Sidelying Rolling: Mod assist, Used rails Sidelying to sit: Max assist, +2 for physical assistance, HOB  elevated     Sit to sidelying: Total assist, +2 for physical assistance (pt unable to assist due to medical state) General bed mobility comments: Pt requires +2 assist to get to EOB and appears to have pain with this task. As soon as pt was in sitting, pt began moaning and passed out vs sz activity and was returned back to sidelying until episode subsided.    Transfers                   General transfer comment: unable. Would like to use hoyer to get pt up. Hoyer not charged. Will attempt as able next session.     Balance Overall balance assessment: Needs assistance Sitting-balance support: Feet supported Sitting balance-Leahy Scale: Poor Sitting balance - Comments: unable to maintain EOB due to pain and passing out       Standing balance comment: unable                           ADL either performed or assessed with clinical judgement   ADL Overall ADL's : Needs assistance/impaired Eating/Feeding: Set up;Bed level   Grooming: Set up;Bed level       Lower Body Bathing: Total assistance;Bed level;+2 for safety/equipment Lower Body Bathing Details (indicate cue type and reason): funcitonal level currently limited by Left femoral HD line     Lower Body Dressing: Total assistance;+2 for safety/equipment;Bed level       Toileting- Clothing Manipulation and Hygiene: Total assistance;+2 for safety/equipment;Bed level Toileting - Clothing Manipulation Details (indicate cue type and reason): pt had bowel movement in bed during  medical episode during OT/PT session     Functional mobility during ADLs: Maximal assistance;+2 for physical assistance;+2 for safety/equipment General ADL Comments: Pt stopped following commands and had mild rigid state getting to EOB during session therefore session limited.    Extremity/Trunk Assessment Upper Extremity Assessment Upper Extremity Assessment: RUE deficits/detail;LUE deficits/detail RUE Deficits / Details: gross  strength 4/5; AROM, coordination, and sensation WFL RUE Sensation: WNL RUE Coordination: decreased fine motor LUE Deficits / Details: gross strength 3/5, AROM WFL; decreased sensation; decreased fine and gross motor coordination; pt reports decreased strength, sensation, and coordination since prior stroke LUE Sensation: decreased light touch;decreased proprioception;history of peripheral neuropathy LUE Coordination: decreased gross motor;decreased fine motor   Lower Extremity Assessment Lower Extremity Assessment: Defer to PT evaluation        Vision   Vision Assessment?: No apparent visual deficits   Perception     Praxis     Communication Communication Communication: No apparent difficulties;Other (comment)   Cognition Arousal: Alert Behavior During Therapy: Flat affect Cognition: No family/caregiver present to determine baseline, Cognition impaired     Awareness: Intellectual awareness intact, Online awareness intact   Attention impairment (select first level of impairment): Selective attention Executive functioning impairment (select all impairments): Problem solving, Organization OT - Cognition Comments: Pt's cognition improved at start ot session. During session pt had an episode when he passed out sitting on EOB vs sz like activity with eyes fixed in upward gaze. Pt did not respond for appx 1  minute and  had bowel movement during episide. Nursing and MDs notified and at bedside at end of episode.                 Following commands: Impaired Following commands impaired: Only follows one step commands consistently      Cueing   Cueing Techniques: Verbal cues, Tactile cues, Gestural cues  Exercises      Shoulder Instructions       General Comments Pt very limited this session due to sz vs LOC    Pertinent Vitals/ Pain       Pain Assessment Pain Assessment: Faces Faces Pain Scale: Hurts a little bit Pain Location: back area when sitting up too  far Pain Descriptors / Indicators: Discomfort, Guarding Pain Intervention(s): Limited activity within patient's tolerance, Monitored during session, Repositioned  Home Living                                          Prior Functioning/Environment              Frequency  Min 1X/week        Progress Toward Goals  OT Goals(current goals can now be found in the care plan section)  Progress towards OT goals: Progressing toward goals  Acute Rehab OT Goals Patient Stated Goal: none stated OT Goal Formulation: With patient Time For Goal Achievement: 11/25/24 Potential to Achieve Goals: Good ADL Goals Pt Will Perform Grooming: with modified independence;sitting Pt Will Perform Upper Body Bathing: with supervision;sitting Pt Will Perform Lower Body Dressing: with min assist;sitting/lateral leans Pt Will Transfer to Toilet: with min assist;stand pivot transfer;bedside commode Pt Will Perform Toileting - Clothing Manipulation and hygiene: with min assist;sitting/lateral leans Pt/caregiver will Perform Home Exercise Program: Increased strength;Both right and left upper extremity;With theraband;With theraputty;With Supervision;With written HEP provided  Plan      Co-evaluation    PT/OT/SLP Co-Evaluation/Treatment:  Yes Reason for Co-Treatment: Complexity of the patient's impairments (multi-system involvement);For patient/therapist safety PT goals addressed during session: Mobility/safety with mobility OT goals addressed during session: ADL's and self-care      AM-PAC OT 6 Clicks Daily Activity     Outcome Measure   Help from another person eating meals?: A Little Help from another person taking care of personal grooming?: A Little Help from another person toileting, which includes using toliet, bedpan, or urinal?: Total Help from another person bathing (including washing, rinsing, drying)?: A Lot Help from another person to put on and taking off regular  upper body clothing?: A Lot Help from another person to put on and taking off regular lower body clothing?: Total 6 Click Score: 12    End of Session Equipment Utilized During Treatment: Oxygen  OT Visit Diagnosis: Other abnormalities of gait and mobility (R26.89);Muscle weakness (generalized) (M62.81)   Activity Tolerance Patient limited by fatigue   Patient Left in bed;with call bell/phone within reach;with bed alarm set   Nurse Communication Other (comment) (medical episode relayed to nursing and MDs)        Time: [phone] OT Time Calculation (min): 29 min  Charges: OT General Charges $OT Visit: 1 Visit OT Treatments $Self Care/Home Management : 8-22 mins   Joshua Silvano Dragon 11/22/2024, 9:59 AM

## 2024-11-22 NOTE — Progress Notes (Signed)
 Physical Therapy Treatment Patient Details Name: [name] VALENT Sr. MRN: [id number] DOB: 1961-06-18 Today's Date: 11/22/2024   History of Present Illness [name] REIERSON Sr. is a 64 y.o. male who presented to [facility] ED on 10/24/2024 with dyspnea x roughly 2-3 weeks. Pt placed on CRRT for volume overload, off 12/22. 12/25. Returned to ICU for hypotension. PMH: ESRD on HD MWF, A-fib, systolic CHF, hypertension, LBBB, DM.    PT Comments  Goals updated, reactivated due to limited progress and medical issues during visit today preventing further progression of mobility.  Agreeable to work on seated balance transfers. Rolled in bed using rail with guidance, +2 max assist to rise to EOB. Developed a fixed gaze (upward and towards left) became unresponsive and increased muscle tone throughout with erratic breathing. Total assist +2 into sidelying recovery position due to frequent dry heaving (no emesis produced). Episode lasted about 1.5 minutes before pt able to track again. Reports he is dizzy despite HOB up to 45 deg and MAP registering mid 70s. Significant focal deficits appreciated. Medical team in room to assume care at end of session. Patient will continue to benefit from skilled physical therapy services to further improve independence with functional mobility.    If plan is discharge home, recommend the following: Assist for transportation;Help with stairs or ramp for entrance;Two people to help with walking and/or transfers;Two people to help with bathing/dressing/bathroom;Assistance with cooking/housework;Supervision due to cognitive status   Can travel by private vehicle     No  Equipment Recommendations  Wheelchair (measurements PT);Wheelchair cushion (measurements PT);Hoyer lift    Recommendations for Other Services       Precautions / Restrictions Precautions Precautions: Fall;Other (comment) Recall of Precautions/Restrictions: Impaired Precaution/Restrictions Comments: femoral  lines; R BKA, has prosthesis Restrictions Weight Bearing Restrictions Per Provider Order: No Other Position/Activity Restrictions: femoral lines     Mobility  Bed Mobility Overal bed mobility: Needs Assistance Bed Mobility: Rolling, Sidelying to Sit, Sit to Sidelying Rolling: Mod assist, Used rails Sidelying to sit: Max assist, +2 for physical assistance, HOB elevated     Sit to sidelying: Total assist, +2 for physical assistance (pt unable to assist due to medical state) General bed mobility comments: Pt requires +2 assist to get to EOB and appears to have pain with this task. Able to grasp rail to pull with guidance. As soon as pt was in sitting, pt began moaning and passed out with szr like activity and was returned back to sidelying until episode subsided.    Transfers                   General transfer comment: unable. Would like to use hoyer to get pt up. Hoyer not charged. Will attempt as able next session.    Ambulation/Gait               General Gait Details: pt non-ambulatory at baseline   Stairs             Wheelchair Mobility     Tilt Bed    Modified Rankin (Stroke Patients Only)       Balance Overall balance assessment: Needs assistance Sitting-balance support: Feet supported Sitting balance-Leahy Scale: Poor Sitting balance - Comments: unable to maintain EOB due to pain and passing out       Standing balance comment: unable  Communication Communication Communication: No apparent difficulties;Other (comment)  Cognition Arousal: Alert Behavior During Therapy: Flat affect   PT - Cognitive impairments: No family/caregiver present to determine baseline                         Following commands: Impaired Following commands impaired: Only follows one step commands consistently    Cueing Cueing Techniques: Verbal cues, Tactile cues, Gestural cues  Exercises      General  Comments General comments (skin integrity, edema, etc.): Pt very limited this session due to syncope/seizure like activity (MD aware). Unable to obtain accurate BP during transition but MAP in mid 70s prior to activity with HOB elevated. SpO2 86% briefly during episode, back to mid 90s on HFNC after epsiode. increased WOB end of session.      Pertinent Vitals/Pain Pain Assessment Pain Assessment: Faces Faces Pain Scale: Hurts a little bit Pain Location: back area when sitting up too far Pain Descriptors / Indicators: Discomfort, Guarding Pain Intervention(s): Limited activity within patient's tolerance, Repositioned, Monitored during session    Home Living                          Prior Function            PT Goals (current goals can now be found in the care plan section) Acute Rehab PT Goals Patient Stated Goal: get better Progress towards PT goals: Not progressing toward goals - comment    Frequency    Min 2X/week      PT Plan      Co-evaluation PT/OT/SLP Co-Evaluation/Treatment: Yes Reason for Co-Treatment: Complexity of the patient's impairments (multi-system involvement);For patient/therapist safety;Necessary to address cognition/behavior during functional activity;To address functional/ADL transfers PT goals addressed during session: Mobility/safety with mobility;Balance OT goals addressed during session: ADL's and self-care      AM-PAC PT 6 Clicks Mobility   Outcome Measure  Help needed turning from your back to your side while in a flat bed without using bedrails?: A Lot Help needed moving from lying on your back to sitting on the side of a flat bed without using bedrails?: Total Help needed moving to and from a bed to a chair (including a wheelchair)?: Total Help needed standing up from a chair using your arms (e.g., wheelchair or bedside chair)?: Total Help needed to walk in hospital room?: Total Help needed climbing 3-5 steps with a railing? :  Total 6 Click Score: 7    End of Session Equipment Utilized During Treatment: Gait belt;Oxygen Activity Tolerance: Treatment limited secondary to medical complications (Comment) (syncope, seizure like episode) Patient left: in bed;with call bell/phone within reach;with bed alarm set (medical team in room assessing pt.) Nurse Communication: Mobility status;Need for lift equipment PT Visit Diagnosis: Muscle weakness (generalized) (M62.81);Other abnormalities of gait and mobility (R26.89);Difficulty in walking, not elsewhere classified (R26.2);Other symptoms and signs involving the nervous system (R29.898)     Time: 9141-9075 PT Time Calculation (min) (ACUTE ONLY): 26 min  Charges:    $Therapeutic Activity: 8-22 mins PT General Charges $$ ACUTE PT VISIT: 1 Visit                     [name] [name], PT, DPT [facility]  Rehabilitation Services Physical Therapist Office: [phone] Website: [email or web address]    [name] [name] 11/22/2024, 11:15 AM

## 2024-11-22 NOTE — Progress Notes (Addendum)
 Pt has been approved a clinic transfer. Pt has been tentatively accepted at [facility] clinic, TTS schedule, 1145am chair time, pending d/c. Start date with clinic tentatively set as 1/13, pending d/c. Will need to arrive 30 mins early 2st day. Informed care team including SW/CM, clinic, admissions, attending, nephrologist, and RN. AVS updated to reflect change. Will continue to assist as needed.   Kemuel Buchmann Dialysis Navigator [phone]

## 2024-11-22 NOTE — Progress Notes (Signed)
 Pharmacy Antibiotic Note  [name] [name]. is a 64 y.o. male admitted on 10/24/2024 with sepsis and MRSA bacteremia.  Pharmacy has been consulted for vancomycin  and zosyn  dosing. Patient transitioning from iHD to CRRT tonight. Patient with no documented urine output.   Plan: Change zosyn  to 3.375g q6h.  Vancomycin  last dosed 1/5 PM post-HD. Planning to start CRRT at some point this afternoon, order vancomycin  random for the AM. If level < 15 will scheduled regimen.  Follow culture data for de-escalation.  Monitor renal function for dose adjustments as indicated.   Height: 6' 1 (185.4 cm) Weight: 117.8 kg (259 lb 11.2 oz) IBW/kg (Calculated) : 79.9  Temp (24hrs), Avg:98.2 F (36.8 C), Min:97.7 F (36.5 C), Max:98.7 F (37.1 C)  Recent Labs  Lab 11/16/24 0436 11/17/24 0339 11/18/24 0500 11/19/24 0102 11/20/24 0237 11/21/24 0351 11/21/24 1040 11/21/24 1624 11/21/24 1922 11/21/24 2222 11/22/24 0457 11/22/24 1041 11/22/24 1154  WBC 7.4  --  7.4 7.4 9.6 10.4  --   --   --   --   --   --   --   CREATININE 6.07*   < > 5.80* 5.07* 4.64* 5.61*  --   --   --   --  4.75*  --   --   LATICACIDVEN  --   --   --   --   --   --    < > 8.1* 7.3* 8.3*  --  6.3* 5.7*   < > = values in this interval not displayed.    Estimated Creatinine Clearance: 21.7 mL/min (A) (by C-G formula based on SCr of 4.75 mg/dL (H)).    Allergies[1]  Thank you for allowing pharmacy to be a part of this patients care.  Powell Blush, PharmD, BCCCP  11/22/2024 2:41 PM     [1]  Allergies Allergen Reactions   Sulfa Antibiotics Diarrhea and Nausea And Vomiting

## 2024-11-22 NOTE — Progress Notes (Signed)
 " Daily Progress Note   Date: 11/22/2024   Patient Name: [name] FRISBEE Sr.  DOB: [birth year]  MRN: [id number]  Age / Sex: 64 y.o., male  Attending Physician: Pleas Newborn, MD Primary Care Physician: [name], MD Admit Date: 10/24/2024 Length of Stay: 29 days  Reason for Follow-up: Establishing goals of care  Past Medical History:  Diagnosis Date   ESRD on hemodialysis (HCC)    a. HD MWF > 10 years.   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 01/2022 Echo: EF 45-50%, glob HK, mod LVH, mildly reduced RV fxn, mild MR, mod-sev MV Ca2+. Mild AS; b. 10/2023 Echo: EF 45-50%, glob HK, GrII DD, mildly reduced RV fxn, RVSP 52.49mmHg. Mild MR/MS, mod TR.   Hypertension    Nonischemic cardiomyopathy (HCC)    a. 2019 Cath [facility]): Nonobstructive disease; b. 12/2019 Echo: EF 50%; c. 12/2019 MV (Tx w/u [facility]): Ant infarct w/ peri-infarct ischemia. EF 35%; c. 05/2021 MV (Tx w/u [facility]): Cor Ca2+, EF 53%, motion artifact, equiv study, likely low risk.   Nonobstructive coronary artery disease    a.  2019 Cath ([place] [facility]): Left main normal, LAD 30 proximal, RCA 30 distal, EF 25%.   Obesity    Peripheral arterial disease    Peripheral vascular disease    Type 2 diabetes mellitus (HCC)    a. 10/2023 - admission w/ [facility].    Assessment & Plan:   HPI/Patient Profile:   64 y.o. male  with past medical history of ESRD on HD, HFrEF (20-25% EF), T2DM, atrial fibrillation admitted on 10/24/2024 with MRSA bacteremia and AHRF 2/2 pneumonia. Per H&P on 10/24/2024 by Pearlean MD, patient presented to [facility] for 2-3 weeks of shortness of breath. CT chest on 10/24/2024 demonstrated multilobar bonchopneumonia. Blood cultures on 10/24/2024 positive for MRSA bacteremia. 10/26/2024 had left thoracentesis with 450 mL transudate fluid removed with negative cultures. Transferred to [facility] 11/02/2024 for management of septic shock and IR evaluation for [facility] placement which has not been completed due  to continued pressor needs.   Palliative medicine consulted for goals of care conversation.  SUMMARY OF RECOMMENDATIONS Full code, full scope Patient shared he would be agreeable to hospice if daughter decides  Symptom Management:  Per primary team  Code Status: Full Code  Prognosis: Unable to determine  Discharge Planning: To Be Determined  Discussed with: Elicia DO 11/22/2024 about the patient's decision to defer to his daughter on whether or not they pursue hospice.   Subjective:   Subjective: Chart Reviewed. Updates received. Patient Assessed. Created space and opportunity for patient  and family to explore thoughts and feelings regarding current medical situation.  Today's Discussion:  Met with the patient at the bedside today without any visitors present. Patient denies any discomfort or pain at this time. Shares that he continues to feel the same as he has in the last couple of days. Shares that he does not have an appetite, which has been ongoing for several weeks. Denies being depressed or sad, remains overall flat affect. Reviewed current medical concerns from the ICU team including his increased needs for vasopressors to maintain his blood pressure even at rest and not undergoing dialysis. Patient shares that he understands. He also shared that earlier in the shift he lost consciousness with a simple turn to be cleaned. Shared with him that it is very concerning that he is passing out from minimal effort and we are very worried about his overall health.  Discussed with patient concern for prolonged ICU stay and prolonged hospital stay and patient shared that he would not want to be in the hospital for months and months. Shared with him another pathway that may be possible for him to get out of the hospital and that is comfort measures/hospice pathway. Patient shared that may be reasonable considering how weak he is now. He shares that he understands what hospice is and that it means  that he would have very limited time left to live. He was not ready to make a decision at this time. Inquired if his daughter made the decision to transition him to hospice would he be agreeable to it and he said that he would be okay if his daughter made the decision. Patient did not have any concerns over his current medical stay and did not have any further questions on hospice.   Review of Systems  Constitutional:  Positive for appetite change and fatigue.  Neurological:  Positive for syncope.  All other systems reviewed and are negative.   Objective:   Primary Diagnoses: Present on Admission:  Multifocal pneumonia  Below-knee amputation of right lower extremity (HCC)  Chronic systolic CHF (congestive heart failure) (HCC)  Type 2 diabetes mellitus with diabetic chronic kidney disease (HCC)  Severe sepsis with septic shock  MRSA bacteremia  Hypotension  CAD (coronary artery disease)  Peripheral vascular disease, unspecified   Vital Signs:  BP (!) 55/18 (BP Location: Right Wrist)   Pulse 88   Temp 98.5 F (36.9 C) (Oral)   Resp (!) 24   Ht 6' 1 (1.854 m)   Wt 117.5 kg   SpO2 97%   BMI 34.18 kg/m   Physical Exam Constitutional:      Appearance: He is ill-appearing.  HENT:     Head: Normocephalic.  Cardiovascular:     Rate and Rhythm: Normal rate.  Pulmonary:     Effort: Pulmonary effort is normal.  Musculoskeletal:     Right lower leg: Edema present.     Left lower leg: Edema present.  Skin:    General: Skin is warm and dry.  Neurological:     General: No focal deficit present.     Mental Status: He is alert.  Psychiatric:     Comments: Flat affect    Palliative Assessment/Data: 40%   Existing Vynca/ACP Documentation: HCPOA - daughter [name]  Thank you for allowing us  to participate in the care of [name] CORNELIO Sr. PMT will continue to support holistically.  I personally spent a total of 35 minutes in the care of the patient today including preparing  to see the patient, getting/reviewing separately obtained history, performing a medically appropriate exam/evaluation, counseling and educating, referring and communicating with other health care professionals, and documenting clinical information in the EHR.   Fairy FORBES Shan DEVONNA  Palliative Medicine Team  Team Phone # [phone] (Nights/Weekends) 11/22/2024 8:24 AM  "

## 2024-11-22 NOTE — Progress Notes (Addendum)
 "  NAME:  [name] Ballow., MRN:  [id number], DOB:  1961-01-05, LOS: 29 ADMISSION DATE:  10/24/2024, CONSULTATION DATE:  12/17 REFERRING MD:  Ricky - [facility], CHIEF COMPLAINT:  hypotension  History of Present Illness:  [name] PAVEK Sr. is a 64 y.o. male who has a PMH as outlined below including but not limited to ESRD on HD MWF, A-fib, systolic CHF, hypertension, LBBB, DM.  He presented to [facility] ED on 10/24/2024 with dyspnea x roughly 2-3 weeks. He had been seen in the ED a few days early on 12/2 for dysuria and was started on a course of Cipro  which she started the following day and stated that he had been taking as prescribed.     CT chest that demonstrated multifocal pneumonia and small effusions.  Blood cultures ultimately grew MRSA.  He had been on vancomycin  which was continued. He had a left thora 12/10 with 450cc fluid removed.  His HD catheter was removed on 10/28/2024 as well as an arterial line.  He did have a TTE on 12/11 that showed an EF of 20-25%, G2 DD, mild decrease in RV SF, severe AAS, no vegetations.  TEE was recommended but had been deferred due to patient requiring vasopressor support.  ID was consulted and recommended line holiday.  He had a temporary femoral HD cath placed on 12/15.  ID recommendations included 6 weeks of vancomycin  to end on 12/09/2024.    12/17, he was transferred to [facility] for ongoing management for septic shock as well as for IR evaluation for tunneled HD catheter placement  Pertinent  Medical History  ESRD on HD MWF, Afib, HFrEF, T2DM, LBBB, severe PVD s/p R BKA Past Medical History:  Diagnosis Date   ESRD on hemodialysis (HCC)    a. HD MWF > 10 years.   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 01/2022 Echo: EF 45-50%, glob HK, mod LVH, mildly reduced RV fxn, mild MR, mod-sev MV Ca2+. Mild AS; b. 10/2023 Echo: EF 45-50%, glob HK, GrII DD, mildly reduced RV fxn, RVSP 52.63mmHg. Mild MR/MS, mod TR.   Hypertension    Nonischemic  cardiomyopathy (HCC)    a. 2019 Cath [facility]): Nonobstructive disease; b. 12/2019 Echo: EF 50%; c. 12/2019 MV (Tx w/u [facility]): Ant infarct w/ peri-infarct ischemia. EF 35%; c. 05/2021 MV (Tx w/u [facility]): Cor Ca2+, EF 53%, motion artifact, equiv study, likely low risk.   Nonobstructive coronary artery disease    a.  2019 Cath ([place] [facility]): Left main normal, LAD 30 proximal, RCA 30 distal, EF 25%.   Obesity    Peripheral arterial disease    Peripheral vascular disease    Type 2 diabetes mellitus (HCC)    a. 10/2023 - admission w/ HHS.   Significant Hospital Events: Including procedures, antibiotic start and stop dates in addition to other pertinent events   12/8 admit 12/10 L thora with 450cc removed, transudate, neg cultures 12/11 echo 12/12 HD cath and art line removed 12/15 new femoral HD cath placed 12/17 transfer to [facility] 37M 12/19 HD  12/20 CRRT 12/23 Weaned off levophed  this am. HD trial this am. Transferred to [facility] 12/25 Returned to ICU for hypotension 12/28-remains on Levophed  12/29-remains on Levophed , tolerating dialysis 12/31 was off Levophed  this morning 1/2 on Levophed  and had HD 1/4 changed to neo and vaso, stopped levophed  1/5 HD  Interim History / Subjective:  Awake and alert. Denies pain/discomfort. No n/v. Still cough. No abd  pain. Ate little. Again spoke about concerns of being on pressors still. States he is aware he needs dialysis to continue living and still wants to continue.   NSR Wean to 2L [place] on vasopressin  and phenylephrine    Objective    Blood pressure (!) 71/59, pulse 91, temperature 98.7 F (37.1 C), temperature source Oral, resp. rate 13, height 6' 1 (1.854 m), weight 117.5 kg, SpO2 94%.        Intake/Output Summary (Last 24 hours) at 11/22/2024 0653 Last data filed at 11/22/2024 0600 Gross per 24 hour  Intake 2189.53 ml  Output 3500 ml  Net -1310.47 ml   Filed Weights   11/21/24 0500 11/21/24 1456 11/21/24 1909  Weight: 121 kg 121  kg 117.5 kg   Physical Exam:  General: Middle-age, alert, does not appear to be in distress Respiratory: Normal effort on 2L [place]. Clear breath sounds bilaterally Cardiovascular: RRR GI: BS +, soft, NT Extremities: RUE w/o pain and able to do ROM, no erythema. Right below-knee amputation, 2+ pitting edema in left lower extremity neuro: Awake, alert and oriented Psych: flat affect  Lab data reviewed Na 129--132 (chronic/stable) Total Bili 3.1--3.4  ALP 164--153  Resolved problem list   Assessment and Plan   Chronic hypotension - On midodrine  20 mg TID, Strattera  20 mg BID - Now on phenylephrine  and vaso (off levophed ) - Has femoral a-line   Multifocal pneumonia Hypoxemic respiratory failure, resolved -Completed 5 days of CTX -Continue duoneb PRN -Wean supplemental O2 as tolerated, SpO2 goal >92% -New fever, suspect new pneumonia, started on Zosyn  (1/4-  ) -Repeat CXR today   Lactic acidosis  LA up >9 but trending down. Tolerating HD. Coox not consistent w/ cardiogenic shock. Abd exam was soft, NT w/ BS+. Extremities unchanged. Trend LA.  -Repeat Echo -Trend lactic acid  Elevated ALP Elevated Bili Still elevated. Denies abd pain. -RUQ us  -GGT -Trend liver enzymes   Afib w/ RVR Suspect from new sepsis suspect aspiration PNA.  -On phenylephrine , stopped levophed  -Zosyn  as above -Appears to be in NSR, HR controlled at this time  Heart failure with reduced ejection fraction of 20 to 25% Severe aortic stenosis - Not on GDMT due to hypotension  - Follows w/ outpatient cardiology, medical management - Volume removal per nephrology  Septic shock secondary to MRSA bacteremia - Goal is to complete vancomycin  dosing on 12/09/2024 - Has temporary dialysis catheter in place - Plan for [facility] per nephrology and IR  - Unable to complete TEE at this time - 12/12 Bcx negative, 12/18 Bcx negative, pend 1/5 Bcx  End-stage renal disease on hemodialysis - Appreciate nephrology  assistance, HD per nephrology  - Home Cinacalcet  and sevelamer  - Has temp HD catheter, will need TDC (spoke w/ Dr. Jerrye, nephrology to speak w/ IR)  Type 2 diabetes CBG goal 140-180, at goal mostly.  - SSI-M  - Novolog  3 units TID w/ meals  Peripheral vascular disease - On aspirin   Depressed mood Poor appetite States no symptoms of abd pain, n/v causing decreased appetite.  -Start mirtazapine  at bedtime   [facility] Palliative following. Discussion w/ pt and pt's daughter who is POA. PMT and CCM expressed concern to both how pt may not tolerate HD outpatient. He remains in ICU due to needs for pressor. Both aware of this but remains full code and scope.    Labs   CBC: Recent Labs  Lab 11/16/24 0436 11/18/24 0500 11/19/24 0102 11/20/24 9762 11/20/24 1223 11/20/24 1251 11/21/24 9648  WBC 7.4 7.4 7.4 9.6  --   --  10.4  HGB 10.8* 10.8* 10.8* 11.3* 13.6 13.3 11.3*  HCT 32.5* 32.6* 32.4* 34.2* 40.0 39.0 34.9*  MCV 77.9* 78.7* 78.5* 78.3*  --   --  80.8  PLT 231 254 270 293  --   --  258    Basic Metabolic Panel: Recent Labs  Lab 11/17/24 0339 11/18/24 0500 11/19/24 0102 11/20/24 0237 11/20/24 1223 11/20/24 1251 11/21/24 0351 11/22/24 0457  NA 128* 127* 129* 131* 132* 131* 129*  --   K 4.4 4.8 4.2 4.5 4.7 4.2 5.2*  --   CL 92* 92* 91* 93*  --   --  91*  --   CO2 24 20* 25 25  --   --  16*  --   GLUCOSE 126* 177* 213* 201*  --   --  117*  --   BUN 26* 31* 25* 21  --   --  27*  --   CREATININE 5.00* 5.80* 5.07* 4.64*  --   --  5.61*  --   CALCIUM  8.0* 7.6* 8.3* 8.7*  --   --  9.0  --   MG 2.0 1.9 2.0 2.1  --   --  2.0 2.0  PHOS 3.0 3.4 3.1 3.2  --   --  4.7*  --    GFR: Estimated Creatinine Clearance: 18.3 mL/min (A) (by C-G formula based on SCr of 5.61 mg/dL (H)). Recent Labs  Lab 11/18/24 0500 11/19/24 0102 11/20/24 0237 11/21/24 0351 11/21/24 1040 11/21/24 1040 11/21/24 1226 11/21/24 1624 11/21/24 1922 11/21/24 2222  PROCALCITON  --   --   --   --   1.95  --   --   --   --   --   WBC 7.4 7.4 9.6 10.4  --   --   --   --   --   --   LATICACIDVEN  --   --   --   --  >9.0*   < > >9.0* 8.1* 7.3* 8.3*   < > = values in this interval not displayed.    Liver Function Tests: Recent Labs  Lab 11/19/24 0102 11/20/24 0237 11/21/24 0351 11/21/24 1624 11/22/24 0457  AST  --   --   --  26 30  ALT  --   --   --  17 18  ALKPHOS  --   --   --  164* 153*  BILITOT  --   --   --  3.1* 3.4*  PROT  --   --   --  8.4* 8.4*  ALBUMIN  3.5 3.5 3.4* 3.4* 3.5   No results for input(s): LIPASE, AMYLASE in the last 168 hours. No results for input(s): AMMONIA in the last 168 hours.  ABG    Component Value Date/Time   PHART 7.449 11/20/2024 1251   PCO2ART 31.5 (L) 11/20/2024 1251   PO2ART 62 (L) 11/20/2024 1251   HCO3 21.9 11/20/2024 1251   TCO2 23 11/20/2024 1251   ACIDBASEDEF 1.0 11/20/2024 1251   O2SAT 73.5 11/21/2024 1214     Coagulation Profile: No results for input(s): INR, PROTIME in the last 168 hours.  Cardiac Enzymes: No results for input(s): CKTOTAL, CKMB, CKMBINDEX, TROPONINI in the last 168 hours.  HbA1C: Hgb A1c MFr Bld  Date/Time Value Ref Range Status  10/24/2024 10:14 AM 8.6 (H) 4.8 - 5.6 % Final    Comment:    (NOTE) Diagnosis of Diabetes The following  HbA1c ranges recommended by the American Diabetes Association (ADA) may be used as an aid in the diagnosis of diabetes mellitus.  Hemoglobin             Suggested A1C NGSP%              Diagnosis  <5.7                   Non Diabetic  5.7-6.4                Pre-Diabetic  >6.4                   Diabetic  <7.0                   Glycemic control for                       adults with diabetes.    02/11/2024 02:27 PM 8.4 (H) 4.8 - 5.6 % Final    Comment:    (NOTE) Pre diabetes:          5.7%-6.4%  Diabetes:              >6.4%  Glycemic control for   <7.0% adults with diabetes     CBG: Recent Labs  Lab 11/21/24 0735 11/21/24 1129  11/21/24 1545 11/21/24 1933 11/21/24 2328  GLUCAP 102* 98 84 102* 98      "

## 2024-11-23 DIAGNOSIS — Z794 Long term (current) use of insulin: Secondary | ICD-10-CM | POA: Diagnosis not present

## 2024-11-23 DIAGNOSIS — N186 End stage renal disease: Secondary | ICD-10-CM | POA: Diagnosis not present

## 2024-11-23 DIAGNOSIS — I4891 Unspecified atrial fibrillation: Secondary | ICD-10-CM | POA: Diagnosis not present

## 2024-11-23 DIAGNOSIS — I132 Hypertensive heart and chronic kidney disease with heart failure and with stage 5 chronic kidney disease, or end stage renal disease: Secondary | ICD-10-CM | POA: Diagnosis not present

## 2024-11-23 DIAGNOSIS — I9589 Other hypotension: Secondary | ICD-10-CM | POA: Diagnosis not present

## 2024-11-23 DIAGNOSIS — E1122 Type 2 diabetes mellitus with diabetic chronic kidney disease: Secondary | ICD-10-CM | POA: Diagnosis not present

## 2024-11-23 DIAGNOSIS — I5022 Chronic systolic (congestive) heart failure: Secondary | ICD-10-CM | POA: Diagnosis not present

## 2024-11-23 DIAGNOSIS — A4102 Sepsis due to Methicillin resistant Staphylococcus aureus: Secondary | ICD-10-CM | POA: Diagnosis not present

## 2024-11-23 DIAGNOSIS — R6521 Severe sepsis with septic shock: Secondary | ICD-10-CM | POA: Diagnosis not present

## 2024-11-23 LAB — VANCOMYCIN, RANDOM: Vancomycin Rm: 19 ug/mL

## 2024-11-23 LAB — CBC
HCT: 31.2 % — ABNORMAL LOW (ref 39.0–52.0)
Hemoglobin: 10.4 g/dL — ABNORMAL LOW (ref 13.0–17.0)
MCH: 26.5 pg (ref 26.0–34.0)
MCHC: 33.3 g/dL (ref 30.0–36.0)
MCV: 79.4 fL — ABNORMAL LOW (ref 80.0–100.0)
Platelets: 225 K/uL (ref 150–400)
RBC: 3.93 MIL/uL — ABNORMAL LOW (ref 4.22–5.81)
RDW: 24.9 % — ABNORMAL HIGH (ref 11.5–15.5)
WBC: 10.6 K/uL — ABNORMAL HIGH (ref 4.0–10.5)
nRBC: 0.4 % — ABNORMAL HIGH (ref 0.0–0.2)

## 2024-11-23 LAB — RENAL FUNCTION PANEL
Albumin: 3.6 g/dL (ref 3.5–5.0)
Albumin: 3.4 g/dL — ABNORMAL LOW (ref 3.5–5.0)
Anion gap: 20 — ABNORMAL HIGH (ref 5–15)
Anion gap: 20 — ABNORMAL HIGH (ref 5–15)
BUN: 26 mg/dL — ABNORMAL HIGH (ref 8–23)
BUN: 21 mg/dL (ref 8–23)
CO2: 18 mmol/L — ABNORMAL LOW (ref 22–32)
CO2: 18 mmol/L — ABNORMAL LOW (ref 22–32)
Calcium: 8.8 mg/dL — ABNORMAL LOW (ref 8.9–10.3)
Calcium: 8.6 mg/dL — ABNORMAL LOW (ref 8.9–10.3)
Chloride: 95 mmol/L — ABNORMAL LOW (ref 98–111)
Chloride: 96 mmol/L — ABNORMAL LOW (ref 98–111)
Creatinine, Ser: 4.21 mg/dL — ABNORMAL HIGH (ref 0.61–1.24)
Creatinine, Ser: 3.38 mg/dL — ABNORMAL HIGH (ref 0.61–1.24)
GFR, Estimated: 15 mL/min — ABNORMAL LOW
GFR, Estimated: 20 mL/min — ABNORMAL LOW
Glucose, Bld: 139 mg/dL — ABNORMAL HIGH (ref 70–99)
Glucose, Bld: 128 mg/dL — ABNORMAL HIGH (ref 70–99)
Phosphorus: 3.1 mg/dL (ref 2.5–4.6)
Phosphorus: 2.6 mg/dL (ref 2.5–4.6)
Potassium: 4.2 mmol/L (ref 3.5–5.1)
Potassium: 4.2 mmol/L (ref 3.5–5.1)
Sodium: 132 mmol/L — ABNORMAL LOW (ref 135–145)
Sodium: 133 mmol/L — ABNORMAL LOW (ref 135–145)

## 2024-11-23 LAB — GLUCOSE, CAPILLARY
Glucose-Capillary: 131 mg/dL — ABNORMAL HIGH (ref 70–99)
Glucose-Capillary: 113 mg/dL — ABNORMAL HIGH (ref 70–99)
Glucose-Capillary: 130 mg/dL — ABNORMAL HIGH (ref 70–99)
Glucose-Capillary: 116 mg/dL — ABNORMAL HIGH (ref 70–99)

## 2024-11-23 LAB — MAGNESIUM: Magnesium: 2.1 mg/dL (ref 1.7–2.4)

## 2024-11-23 MED ORDER — VANCOMYCIN HCL 1500 MG/300ML IV SOLN
1500.0000 mg | INTRAVENOUS | Status: DC
  Administered 2024-11-23 – 2024-11-26 (×4): 1500 mg via INTRAVENOUS
  Filled 2024-11-23 (×4): qty 300

## 2024-11-23 NOTE — Progress Notes (Signed)
 [facility] NEPHROLOGY PROGRESS NOTE   Subjective:   Seen in room A bit confused, talking on phone w/ his family Remains on 3 pressors and CRRT I/O even yest, today net neg so far   Presentation summary: 64 year old man with past medical history significant for hypertension, type 2 diabetes mellitus, status post right below-knee amputation, nonobstructive coronary artery disease, congestive heart failure with reduced ejection fraction, obesity and end-stage renal disease on hemodialysis.  Admitted to the hospital yesterday with SOB. CT scan of the chest showed multifocal bronchopneumonia with small right and moderate loculated left pleural effusion.  He was started on CAP coverage with vancomycin  and cefepime .  Overnight noted to be increasingly hypotensive prompting need to initiate Levophed .  He also has positive blood cultures (4 out of 4) for gram-positive cocci, which indwelling TDC.    Objective Vital signs in last 24 hours: Vitals:   11/19/24 1215 11/19/24 1230 11/19/24 1245 11/19/24 1255  BP: (!) 85/44 (!) 93/45 (!) 90/50 (!) 89/53  Pulse: 89 90 91 90  Resp: (!) 23 20 (!) 26 (!) 25  Temp:      TempSrc:      SpO2: 96% 97% 97% 97%  Weight:      Height:        Dialysis Orders: MWF [facility] 4h  B400  101.9kg  2K bath  TDC  Hep 6000 - Hectoral IV q HD - No ESA/iron   Physical Exam:        General responding today, lying flat, no ^wob Lungs clear to auscultation  Heart S1S2 no rub Abdomen soft nontender obese habitus Ext diffuse 1-2+ edema x 4 ext, R bka Neuro more alert  Access groin temp HD catheter in place    Assessment/ Plan:  # ESRD:  - s/p CRRT 2/20-12/22, now back on iHD - HD MWF here - had HD yest - severe shock now, will not be able to do iHD - CRRT started 1/06 -> cont CRRT    # Septic shock - improved initially from MRSA bacteremia - now severely hypotensive again - on 3 pressors still   # MRSA bacteremia - issue on  admission - bcx's + 12/08, f/u cx's negative - TDC removed - temp line placed 12/15 - IR consulted but remains too sick for new TDC - cont IV abx per pmd  # Hypervolemia - diffuse 3rd spacing, UE/ LE edema  # Anemia of ESRD:    - s/p PRBC - iron  saturation 8%, gave IV ferric gluconate 250mg  x 2  - esa darbe at 60 mcg/wk on hold w/ Hb > 10-11  #Secondary HPTH:   - phos ok, not eating much now - paused cinacalcet  for hypocalcemia   [name]  MD  [facility] 11/23/2024, 3:56 PM  Recent Labs  Lab 11/21/24 0351 11/21/24 1624 11/22/24 1632 11/23/24 0336  HGB 11.3*  --   --  10.4*  ALBUMIN  3.4*   < > 3.6 3.6  CALCIUM  9.0   < > 8.8* 8.8*  PHOS 4.7*   < > 4.1 3.1  CREATININE 5.61*   < > 5.07* 4.21*  K 5.2*   < > 4.6 4.2   < > = values in this interval not displayed.    Inpatient medications:  aspirin  EC  81 mg Oral Q breakfast   atomoxetine   20 mg Oral BID   Chlorhexidine  Gluconate Cloth  6 each Topical Q0600   Chlorhexidine  Gluconate Cloth  6 each Topical Q0600  feeding supplement  237 mL Oral Q24H   guaiFENesin   600 mg Oral BID   heparin   5,000 Units Subcutaneous Q8H   insulin  aspart  0-15 Units Subcutaneous TID WC   midodrine   20 mg Oral Q8H   mirtazapine   7.5 mg Oral QHS   multivitamin  1 tablet Oral QHS   pantoprazole   40 mg Oral Daily   sevelamer  carbonate  800 mg Oral TID WC    anticoagulant sodium citrate      anticoagulant sodium citrate      norepinephrine  (LEVOPHED ) Adult infusion 4 mcg/min (11/23/24 1500)   phenylephrine  (NEO-SYNEPHRINE) Adult infusion 250 mcg/min (11/23/24 1534)   piperacillin -tazobactam (ZOSYN )  IV Stopped (11/23/24 1433)   prismasol  BGK 4/2.5 1,500 mL/hr at 11/23/24 1349   prismasol  BGK 4/2.5 400 mL/hr at 11/23/24 0528   prismasol  BGK 4/2.5 400 mL/hr at 11/23/24 0551   vancomycin  Stopped (11/23/24 1351)   vasopressin  0.03 Units/min (11/23/24 1500)   acetaminophen  **OR** acetaminophen , alteplase , anticoagulant sodium citrate , anticoagulant  sodium citrate , feeding supplement (NEPRO CARB STEADY), guaiFENesin -dextromethorphan , heparin , heparin , heparin , hydrOXYzine , ipratropium-albuterol , lidocaine  (PF), lidocaine  (PF), lidocaine -prilocaine , mouth rinse, pentafluoroprop-tetrafluoroeth, pentafluoroprop-tetrafluoroeth, phenol, polyethylene glycol, trimethobenzamide 

## 2024-11-23 NOTE — Plan of Care (Signed)
  Problem: Clinical Measurements: Goal: Respiratory complications will improve Outcome: Progressing   Problem: Health Behavior/Discharge Planning: Goal: Ability to manage health-related needs will improve Outcome: Not Progressing   Problem: Activity: Goal: Risk for activity intolerance will decrease Outcome: Not Progressing   

## 2024-11-23 NOTE — Progress Notes (Signed)
 Pharmacy Antibiotic Note  [name] [name]. is a 64 y.o. male admitted on 10/24/2024 with MRSA bacteremia.  Pharmacy has been consulted for vancomycin  dosing. Patient transitioned from iHD to CRRT on 1/6. Vancomycin  random this morning therapeutic at 19.   Plan: Schedule vancomycin  1500mg  q24h.  Zosyn  3.375h q6h for potential PNA.  F/u DOT of CRRT and zosyn .  Follow culture data for de-escalation.   Height: 6' 1 (185.4 cm) Weight: 119.2 kg (262 lb 12.6 oz) IBW/kg (Calculated) : 79.9  Temp (24hrs), Avg:98.2 F (36.8 C), Min:97.8 F (36.6 C), Max:98.6 F (37 C)  Recent Labs  Lab 11/18/24 0500 11/19/24 0102 11/20/24 0237 11/21/24 0351 11/21/24 1040 11/21/24 1624 11/21/24 1922 11/21/24 2222 11/22/24 0457 11/22/24 1041 11/22/24 1154 11/22/24 1632 11/23/24 0336  WBC 7.4 7.4 9.6 10.4  --   --   --   --   --   --   --   --  10.6*  CREATININE 5.80* 5.07* 4.64* 5.61*  --   --   --   --  4.75*  --   --  5.07* 4.21*  LATICACIDVEN  --   --   --   --    < > 8.1* 7.3* 8.3*  --  6.3* 5.7*  --   --   VANCORANDOM  --   --   --   --   --   --   --   --   --   --   --   --  19   < > = values in this interval not displayed.    Estimated Creatinine Clearance: 24.6 mL/min (A) (by C-G formula based on SCr of 4.21 mg/dL (H)).    Allergies[1]  Thank you for allowing pharmacy to be a part of this patients care.  Powell Blush, PharmD, BCCCP  11/23/2024 10:47 AM     [1]  Allergies Allergen Reactions   Sulfa Antibiotics Diarrhea and Nausea And Vomiting

## 2024-11-23 NOTE — Progress Notes (Signed)
 "  NAME:  [name] [name]., MRN:  [id number], DOB:  03-08-1961, LOS: 30 ADMISSION DATE:  10/24/2024, CONSULTATION DATE:  12/17 REFERRING MD:  Ricky - [facility], CHIEF COMPLAINT:  hypotension  History of Present Illness:  [name] GOING Sr. is a 64 y.o. male who has a PMH as outlined below including but not limited to ESRD on HD MWF, A-fib, systolic CHF, hypertension, LBBB, DM.  He presented to [facility] ED on 10/24/2024 with dyspnea x roughly 2-3 weeks. He had been seen in the ED a few days early on 12/2 for dysuria and was started on a course of Cipro  which she started the following day and stated that he had been taking as prescribed.     CT chest that demonstrated multifocal pneumonia and small effusions.  Blood cultures ultimately grew MRSA.  He had been on vancomycin  which was continued. He had a left thora 12/10 with 450cc fluid removed.  His HD catheter was removed on 10/28/2024 as well as an arterial line.  He did have a TTE on 12/11 that showed an EF of 20-25%, G2 DD, mild decrease in RV SF, severe AAS, no vegetations.  TEE was recommended but had been deferred due to patient requiring vasopressor support.  ID was consulted and recommended line holiday.  He had a temporary femoral HD cath placed on 12/15.  ID recommendations included 6 weeks of vancomycin  to end on 12/09/2024.    12/17, he was transferred to [facility] for ongoing management for septic shock as well as for IR evaluation for tunneled HD catheter placement  Pertinent  Medical History  ESRD on HD MWF, Afib, HFrEF, T2DM, LBBB, severe PVD s/p R BKA Past Medical History:  Diagnosis Date   ESRD on hemodialysis (HCC)    a. HD MWF > 10 years.   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 01/2022 Echo: EF 45-50%, glob HK, mod LVH, mildly reduced RV fxn, mild MR, mod-sev MV Ca2+. Mild AS; b. 10/2023 Echo: EF 45-50%, glob HK, GrII DD, mildly reduced RV fxn, RVSP 52.52mmHg. Mild MR/MS, mod TR.   Hypertension    Nonischemic  cardiomyopathy (HCC)    a. 2019 Cath [facility]): Nonobstructive disease; b. 12/2019 Echo: EF 50%; c. 12/2019 MV (Tx w/u [facility]): Ant infarct w/ peri-infarct ischemia. EF 35%; c. 05/2021 MV (Tx w/u [facility]): Cor Ca2+, EF 53%, motion artifact, equiv study, likely low risk.   Nonobstructive coronary artery disease    a.  2019 Cath ([facility]): Left main normal, LAD 30 proximal, RCA 30 distal, EF 25%.   Obesity    Peripheral arterial disease    Peripheral vascular disease    Type 2 diabetes mellitus (HCC)    a. 10/2023 - admission w/ [facility].   Significant Hospital Events: Including procedures, antibiotic start and stop dates in addition to other pertinent events   12/8 admit 12/10 L thora with 450cc removed, transudate, neg cultures 12/11 echo 12/12 HD cath and art line removed 12/15 new femoral HD cath placed 12/17 transfer to [facility] 48M 12/19 HD  12/20 CRRT 12/23 Weaned off levophed  this am. HD trial this am. Transferred to [facility] 12/25 Returned to ICU for hypotension 12/28-remains on Levophed  12/29-remains on Levophed , tolerating dialysis 12/31 was off Levophed  this morning 1/2 on Levophed  and had HD 1/4 changed to neo and vaso, stopped levophed  1/5 HD  Interim History / Subjective:   Awake and alert. Denies pain/discomfort. No n/v.  More active  today Better appetite  Objective    Blood pressure (!) 78/52, pulse 92, temperature 98.1 F (36.7 C), temperature source Oral, resp. rate (!) 27, height 6' 1 (1.854 m), weight 119.2 kg, SpO2 100%.        Intake/Output Summary (Last 24 hours) at 11/23/2024 1412 Last data filed at 11/23/2024 1400 Gross per 24 hour  Intake 1820.81 ml  Output 2439 ml  Net -618.19 ml   Filed Weights   11/22/24 0715 11/22/24 2000 11/23/24 0500  Weight: 117.8 kg 118.5 kg 119.2 kg   Physical Exam:  General: Middle-age, alert, does not appear to be in distress Respiratory: Normal effort on 2L [place]. Clear breath sounds bilaterally Cardiovascular: RRR GI:  BS +, soft, NT Extremities: RUE w/o pain and able to do ROM, no erythema. Right below-knee amputation, 2+ pitting edema in left lower extremity neuro: Awake, alert and oriented Psych: flat affect  I reviewed last 24 h vitals and pain scores, last 48 h intake and output, last 24 h labs and trends, and last 24 h imaging results.   Resolved problem list    Assessment and Plan   Shock- septic + cardiogenic Biventricular failure Chronic hypotension MRSA bacteremia- related to tunneled catheter- now removed- blood cultures negative since 12/10. ESRD on HD but needing levophed   Chronic systolic CHF- 20-25%  A fib with RVR Right BKA, hx of PAD Lactic acidosis   - pt on low perfusion state persistently. Lactic acid remains elevated - repeat ECHO confirms biventricular failure- not a candidate for advanced heart failure devices  -  continue love dose levo+ neor+ vaso. HR stable - on CCRT now - continue empiric antibiotics for possible pneumonia - SS as needed - start mirtazapine     GOC See note from yesterday   Labs   CBC: Recent Labs  Lab 11/18/24 0500 11/19/24 0102 11/20/24 0237 11/20/24 1223 11/20/24 1251 11/21/24 0351 11/23/24 0336  WBC 7.4 7.4 9.6  --   --  10.4 10.6*  HGB 10.8* 10.8* 11.3* 13.6 13.3 11.3* 10.4*  HCT 32.6* 32.4* 34.2* 40.0 39.0 34.9* 31.2*  MCV 78.7* 78.5* 78.3*  --   --  80.8 79.4*  PLT 254 270 293  --   --  258 225    Basic Metabolic Panel: Recent Labs  Lab 11/19/24 0102 11/20/24 0237 11/20/24 1223 11/20/24 1251 11/21/24 0351 11/22/24 0457 11/22/24 1632 11/23/24 0336  NA 129* 131*   < > 131* 129* 132* 130* 132*  K 4.2 4.5   < > 4.2 5.2* 4.6 4.6 4.2  CL 91* 93*  --   --  91* 92* 91* 95*  CO2 25 25  --   --  16* 15* 14* 18*  GLUCOSE 213* 201*  --   --  117* 86 125* 139*  BUN 25* 21  --   --  27* 25* 30* 26*  CREATININE 5.07* 4.64*  --   --  5.61* 4.75* 5.07* 4.21*  CALCIUM  8.3* 8.7*  --   --  9.0 8.8* 8.8* 8.8*  MG 2.0 2.1  --   --   2.0 2.0  --  2.1  PHOS 3.1 3.2  --   --  4.7* 4.1 4.1 3.1   < > = values in this interval not displayed.   GFR: Estimated Creatinine Clearance: 24.6 mL/min (A) (by C-G formula based on SCr of 4.21 mg/dL (H)). Recent Labs  Lab 11/19/24 0102 11/20/24 0237 11/21/24 0351 11/21/24 1040 11/21/24 1226 11/21/24 1922 11/21/24 2222  11/22/24 1041 11/22/24 1154 11/23/24 0336  PROCALCITON  --   --   --  1.95  --   --   --   --   --   --   WBC 7.4 9.6 10.4  --   --   --   --   --   --  10.6*  LATICACIDVEN  --   --   --  >9.0*   < > 7.3* 8.3* 6.3* 5.7*  --    < > = values in this interval not displayed.    Liver Function Tests: Recent Labs  Lab 11/21/24 0351 11/21/24 1624 11/22/24 0457 11/22/24 1632 11/23/24 0336  AST  --  26 30  --   --   ALT  --  17 18  --   --   ALKPHOS  --  164* 153*  --   --   BILITOT  --  3.1* 3.4*  --   --   PROT  --  8.4* 8.4*  --   --   ALBUMIN  3.4* 3.4* 3.6  3.5 3.6 3.6   No results for input(s): LIPASE, AMYLASE in the last 168 hours. No results for input(s): AMMONIA in the last 168 hours.  ABG    Component Value Date/Time   PHART 7.449 11/20/2024 1251   PCO2ART 31.5 (L) 11/20/2024 1251   PO2ART 62 (L) 11/20/2024 1251   HCO3 21.9 11/20/2024 1251   TCO2 23 11/20/2024 1251   ACIDBASEDEF 1.0 11/20/2024 1251   O2SAT 73.5 11/21/2024 1214     Coagulation Profile: No results for input(s): INR, PROTIME in the last 168 hours.  Cardiac Enzymes: No results for input(s): CKTOTAL, CKMB, CKMBINDEX, TROPONINI in the last 168 hours.  HbA1C: Hgb A1c MFr Bld  Date/Time Value Ref Range Status  10/24/2024 10:14 AM 8.6 (H) 4.8 - 5.6 % Final    Comment:    (NOTE) Diagnosis of Diabetes The following HbA1c ranges recommended by the American Diabetes Association (ADA) may be used as an aid in the diagnosis of diabetes mellitus.  Hemoglobin             Suggested A1C NGSP%              Diagnosis  <5.7                   Non  Diabetic  5.7-6.4                Pre-Diabetic  >6.4                   Diabetic  <7.0                   Glycemic control for                       adults with diabetes.    02/11/2024 02:27 PM 8.4 (H) 4.8 - 5.6 % Final    Comment:    (NOTE) Pre diabetes:          5.7%-6.4%  Diabetes:              >6.4%  Glycemic control for   <7.0% adults with diabetes     CBG: Recent Labs  Lab 11/22/24 1201 11/22/24 1628 11/22/24 2116 11/23/24 0732 11/23/24 1138  GLUCAP 102* 132* 132* 131* 113*   This patient is critically ill with organ(s) system failure which requires frequent high complexity decision making, assessment, support, evaluation,  and titration of therapies. This was completed through the application of advanced monitoring technologies and extensive interpretation of multiple databases.  During this encounter critical care time was devoted to patient care services described in this note for 33 minutes.  "

## 2024-11-24 DIAGNOSIS — Z794 Long term (current) use of insulin: Secondary | ICD-10-CM | POA: Diagnosis not present

## 2024-11-24 DIAGNOSIS — E1122 Type 2 diabetes mellitus with diabetic chronic kidney disease: Secondary | ICD-10-CM | POA: Diagnosis not present

## 2024-11-24 DIAGNOSIS — J189 Pneumonia, unspecified organism: Secondary | ICD-10-CM | POA: Diagnosis not present

## 2024-11-24 DIAGNOSIS — J188 Other pneumonia, unspecified organism: Secondary | ICD-10-CM | POA: Diagnosis not present

## 2024-11-24 DIAGNOSIS — A4102 Sepsis due to Methicillin resistant Staphylococcus aureus: Secondary | ICD-10-CM | POA: Diagnosis not present

## 2024-11-24 DIAGNOSIS — R6521 Severe sepsis with septic shock: Secondary | ICD-10-CM | POA: Diagnosis not present

## 2024-11-24 DIAGNOSIS — I9589 Other hypotension: Secondary | ICD-10-CM | POA: Diagnosis not present

## 2024-11-24 DIAGNOSIS — I350 Nonrheumatic aortic (valve) stenosis: Secondary | ICD-10-CM | POA: Diagnosis not present

## 2024-11-24 DIAGNOSIS — I5022 Chronic systolic (congestive) heart failure: Secondary | ICD-10-CM | POA: Diagnosis not present

## 2024-11-24 DIAGNOSIS — N186 End stage renal disease: Secondary | ICD-10-CM | POA: Diagnosis not present

## 2024-11-24 DIAGNOSIS — N189 Chronic kidney disease, unspecified: Secondary | ICD-10-CM

## 2024-11-24 DIAGNOSIS — I739 Peripheral vascular disease, unspecified: Secondary | ICD-10-CM | POA: Diagnosis not present

## 2024-11-24 DIAGNOSIS — I4891 Unspecified atrial fibrillation: Secondary | ICD-10-CM | POA: Diagnosis not present

## 2024-11-24 DIAGNOSIS — Z515 Encounter for palliative care: Secondary | ICD-10-CM | POA: Diagnosis not present

## 2024-11-24 DIAGNOSIS — I959 Hypotension, unspecified: Secondary | ICD-10-CM | POA: Diagnosis not present

## 2024-11-24 DIAGNOSIS — I2510 Atherosclerotic heart disease of native coronary artery without angina pectoris: Secondary | ICD-10-CM

## 2024-11-24 DIAGNOSIS — I132 Hypertensive heart and chronic kidney disease with heart failure and with stage 5 chronic kidney disease, or end stage renal disease: Secondary | ICD-10-CM | POA: Diagnosis not present

## 2024-11-24 LAB — RENAL FUNCTION PANEL
Albumin: 3.4 g/dL — ABNORMAL LOW (ref 3.5–5.0)
Albumin: 3.3 g/dL — ABNORMAL LOW (ref 3.5–5.0)
Anion gap: 18 — ABNORMAL HIGH (ref 5–15)
Anion gap: 19 — ABNORMAL HIGH (ref 5–15)
BUN: 16 mg/dL (ref 8–23)
BUN: 14 mg/dL (ref 8–23)
CO2: 18 mmol/L — ABNORMAL LOW (ref 22–32)
CO2: 17 mmol/L — ABNORMAL LOW (ref 22–32)
Calcium: 8.6 mg/dL — ABNORMAL LOW (ref 8.9–10.3)
Calcium: 8.3 mg/dL — ABNORMAL LOW (ref 8.9–10.3)
Chloride: 97 mmol/L — ABNORMAL LOW (ref 98–111)
Chloride: 99 mmol/L (ref 98–111)
Creatinine, Ser: 2.96 mg/dL — ABNORMAL HIGH (ref 0.61–1.24)
Creatinine, Ser: 2.53 mg/dL — ABNORMAL HIGH (ref 0.61–1.24)
GFR, Estimated: 23 mL/min — ABNORMAL LOW
GFR, Estimated: 28 mL/min — ABNORMAL LOW
Glucose, Bld: 96 mg/dL (ref 70–99)
Glucose, Bld: 96 mg/dL (ref 70–99)
Phosphorus: 2.3 mg/dL — ABNORMAL LOW (ref 2.5–4.6)
Phosphorus: 2.1 mg/dL — ABNORMAL LOW (ref 2.5–4.6)
Potassium: 4 mmol/L (ref 3.5–5.1)
Potassium: 4.2 mmol/L (ref 3.5–5.1)
Sodium: 133 mmol/L — ABNORMAL LOW (ref 135–145)
Sodium: 135 mmol/L (ref 135–145)

## 2024-11-24 LAB — HEPATIC FUNCTION PANEL
ALT: 135 U/L — ABNORMAL HIGH (ref 0–44)
AST: 339 U/L — ABNORMAL HIGH (ref 15–41)
Albumin: 3.4 g/dL — ABNORMAL LOW (ref 3.5–5.0)
Alkaline Phosphatase: 135 U/L — ABNORMAL HIGH (ref 38–126)
Bilirubin, Direct: 1.8 mg/dL — ABNORMAL HIGH (ref 0.0–0.2)
Indirect Bilirubin: 0.8 mg/dL (ref 0.3–0.9)
Total Bilirubin: 2.6 mg/dL — ABNORMAL HIGH (ref 0.0–1.2)
Total Protein: 8 g/dL (ref 6.5–8.1)

## 2024-11-24 LAB — GLUCOSE, CAPILLARY
Glucose-Capillary: 97 mg/dL (ref 70–99)
Glucose-Capillary: 95 mg/dL (ref 70–99)
Glucose-Capillary: 101 mg/dL — ABNORMAL HIGH (ref 70–99)
Glucose-Capillary: 132 mg/dL — ABNORMAL HIGH (ref 70–99)

## 2024-11-24 LAB — CBC
HCT: 29.9 % — ABNORMAL LOW (ref 39.0–52.0)
Hemoglobin: 9.7 g/dL — ABNORMAL LOW (ref 13.0–17.0)
MCH: 26.6 pg (ref 26.0–34.0)
MCHC: 32.4 g/dL (ref 30.0–36.0)
MCV: 81.9 fL (ref 80.0–100.0)
Platelets: 188 K/uL (ref 150–400)
RBC: 3.65 MIL/uL — ABNORMAL LOW (ref 4.22–5.81)
RDW: 25.7 % — ABNORMAL HIGH (ref 11.5–15.5)
WBC: 9.2 K/uL (ref 4.0–10.5)
nRBC: 1.7 % — ABNORMAL HIGH (ref 0.0–0.2)

## 2024-11-24 LAB — MAGNESIUM: Magnesium: 2.3 mg/dL (ref 1.7–2.4)

## 2024-11-24 MED ORDER — ENSURE PLUS HIGH PROTEIN PO LIQD
237.0000 mL | Freq: Two times a day (BID) | ORAL | Status: DC
  Administered 2024-11-25 – 2024-11-28 (×4): 237 mL via ORAL

## 2024-11-24 MED ORDER — SODIUM PHOSPHATES 45 MMOLE/15ML IV SOLN
15.0000 mmol | Freq: Once | INTRAVENOUS | Status: AC
  Administered 2024-11-24: 15 mmol via INTRAVENOUS
  Filled 2024-11-24: qty 5

## 2024-11-24 NOTE — Progress Notes (Signed)
 [name] NEPHROLOGY PROGRESS NOTE   Subjective:   Seen in room Net neg 1.7 L yesterday Levo down to 1-3 mcg/min Vaso still at 0.03 Neo down 100 mcg/min   Presentation summary: 64 year old man with past medical history significant for hypertension, type 2 diabetes mellitus, status post right below-knee amputation, nonobstructive coronary artery disease, congestive heart failure with reduced ejection fraction, obesity and end-stage renal disease on hemodialysis.  Admitted to the hospital yesterday with SOB. CT scan of the chest showed multifocal bronchopneumonia with small right and moderate loculated left pleural effusion.  He was started on CAP coverage with vancomycin  and cefepime .  Overnight noted to be increasingly hypotensive prompting need to initiate Levophed .  He also has positive blood cultures (4 out of 4) for gram-positive cocci, which indwelling TDC.    Objective Vital signs in last 24 hours: Vitals:   11/19/24 1215 11/19/24 1230 11/19/24 1245 11/19/24 1255  BP: (!) 85/44 (!) 93/45 (!) 90/50 (!) 89/53  Pulse: 89 90 91 90  Resp: (!) 23 20 (!) 26 (!) 25  Temp:      TempSrc:      SpO2: 96% 97% 97% 97%  Weight:      Height:        Dialysis Orders: MWF [facility] 4h  B400  101.9kg  2K bath  TDC  Hep 6000 - Hectoral IV q HD - No ESA/iron   Physical Exam:        General responding today, lying flat, no ^wob Lungs clear to auscultation  Heart S1S2 no rub Abdomen soft nontender obese habitus Ext diffuse 1-2+ edema x 4 ext, R bka Neuro more alert  Access groin temp HD catheter in place    Assessment/ Plan:  # ESRD:  - s/p CRRT 2/20-12/22, now back on iHD - HD MWF here - had HD yest - severe shock now, will not be able to do iHD - CRRT re-started 1/06 - cont CRRT    # Septic shock - improved initially from MRSA bacteremia - became severely hypotensive again - on 3 pressors still, but dosing is lower  # MRSA bacteremia - issue on  admission - bcx's + 12/08, f/u cx's negative - TDC removed - temp line placed 12/15 - IR consulted but remains too sick for new TDC - cont IV abx per pmd  # Hypervolemia - diffuse 3rd spacing, UE/ LE edema - will ^ UF to 65- 125 cc/hr w/ CRRT   # Anemia of ESRD:    - s/p PRBC - iron  saturation 8%, gave IV ferric gluconate 250mg  x 2  - esa darbe at 60 mcg/wk on hold w/ Hb > 10-11  #Secondary HPTH:   - phos ok, not eating much now - paused cinacalcet  for hypocalcemia   [name]  MD  [facility] 11/24/2024, 2:53 PM  Recent Labs  Lab 11/23/24 0336 11/23/24 1613 11/24/24 0323  HGB 10.4*  --  9.7*  ALBUMIN  3.6 3.4* 3.4*  3.4*  CALCIUM  8.8* 8.6* 8.6*  PHOS 3.1 2.6 2.3*  CREATININE 4.21* 3.38* 2.96*  K 4.2 4.2 4.0    Inpatient medications:  aspirin  EC  81 mg Oral Q breakfast   atomoxetine   20 mg Oral BID   Chlorhexidine  Gluconate Cloth  6 each Topical Q0600   Chlorhexidine  Gluconate Cloth  6 each Topical Q0600   feeding supplement  237 mL Oral BID BM   guaiFENesin   600 mg Oral BID   heparin   5,000 Units Subcutaneous Q8H  insulin  aspart  0-15 Units Subcutaneous TID WC   midodrine   20 mg Oral Q8H   mirtazapine   7.5 mg Oral QHS   multivitamin  1 tablet Oral QHS   pantoprazole   40 mg Oral Daily   sevelamer  carbonate  800 mg Oral TID WC    anticoagulant sodium citrate      anticoagulant sodium citrate      norepinephrine  (LEVOPHED ) Adult infusion 3 mcg/min (11/24/24 1400)   phenylephrine  (NEO-SYNEPHRINE) Adult infusion 200 mcg/min (11/24/24 1400)   piperacillin -tazobactam (ZOSYN )  IV Stopped (11/24/24 1156)   prismasol  BGK 4/2.5 1,500 mL/hr at 11/24/24 1347   prismasol  BGK 4/2.5 400 mL/hr at 11/24/24 0714   prismasol  BGK 4/2.5 400 mL/hr at 11/24/24 9286   sodium PHOSPHATE  IVPB (in mmol)     vancomycin  Stopped (11/24/24 1119)   vasopressin  0.03 Units/min (11/24/24 1400)   acetaminophen  **OR** acetaminophen , alteplase , anticoagulant sodium citrate , anticoagulant sodium  citrate, guaiFENesin -dextromethorphan , heparin , heparin , heparin , hydrOXYzine , ipratropium-albuterol , lidocaine  (PF), lidocaine  (PF), lidocaine -prilocaine , mouth rinse, pentafluoroprop-tetrafluoroeth, pentafluoroprop-tetrafluoroeth, phenol, polyethylene glycol, trimethobenzamide 

## 2024-11-24 NOTE — Plan of Care (Signed)
" °  Problem: Education: Goal: Knowledge of General Education information will improve Description: Including pain rating scale, medication(s)/side effects and non-pharmacologic comfort measures Outcome: Progressing   Problem: Health Behavior/Discharge Planning: Goal: Ability to manage health-related needs will improve Outcome: Progressing   Problem: Clinical Measurements: Goal: Ability to maintain clinical measurements within normal limits will improve Outcome: Progressing Goal: Will remain free from infection Outcome: Progressing Goal: Diagnostic test results will improve Outcome: Progressing Goal: Respiratory complications will improve Outcome: Progressing Goal: Cardiovascular complication will be avoided Outcome: Progressing   Problem: Activity: Goal: Risk for activity intolerance will decrease Outcome: Progressing   Problem: Nutrition: Goal: Adequate nutrition will be maintained Outcome: Progressing   Problem: Elimination: Goal: Will not experience complications related to bowel motility Outcome: Progressing   Problem: Pain Managment: Goal: General experience of comfort will improve and/or be controlled Outcome: Progressing   Problem: Safety: Goal: Ability to remain free from injury will improve Outcome: Progressing   Problem: Skin Integrity: Goal: Risk for impaired skin integrity will decrease Outcome: Progressing   Problem: Respiratory: Goal: Ability to maintain adequate ventilation will improve Outcome: Progressing Goal: Ability to maintain a clear airway will improve Outcome: Progressing   Problem: Coping: Goal: Ability to adjust to condition or change in health will improve Outcome: Progressing   Problem: Fluid Volume: Goal: Ability to maintain a balanced intake and output will improve Outcome: Progressing   Problem: Health Behavior/Discharge Planning: Goal: Ability to identify and utilize available resources and services will improve Outcome:  Progressing Goal: Ability to manage health-related needs will improve Outcome: Progressing   Problem: Metabolic: Goal: Ability to maintain appropriate glucose levels will improve Outcome: Progressing   Problem: Nutritional: Goal: Maintenance of adequate nutrition will improve Outcome: Progressing Goal: Progress toward achieving an optimal weight will improve Outcome: Progressing   Problem: Activity: Goal: Ability to tolerate increased activity will improve Outcome: Progressing   Problem: Cardiac: Goal: Ability to achieve and maintain adequate cardiopulmonary perfusion will improve Outcome: Progressing   "

## 2024-11-24 NOTE — Progress Notes (Signed)
 Pt refused to eat this morning. Will cont to encourage PO intake.

## 2024-11-24 NOTE — Progress Notes (Signed)
 "  NAME:  [name] [name]., MRN:  [id number], DOB:  12/10/1960, LOS: 31 ADMISSION DATE:  10/24/2024, CONSULTATION DATE:  12/17 REFERRING MD:  Ricky - [facility], CHIEF COMPLAINT:  hypotension  History of Present Illness:  [name] [name] Sr. is a 64 y.o. male who has a PMH as outlined below including but not limited to ESRD on HD MWF, A-fib, systolic CHF, hypertension, LBBB, DM.  He presented to [facility] ED on 10/24/2024 with dyspnea x roughly 2-3 weeks. He had been seen in the ED a few days early on 12/2 for dysuria and was started on a course of Cipro  which she started the following day and stated that he had been taking as prescribed.     CT chest that demonstrated multifocal pneumonia and small effusions.  Blood cultures ultimately grew MRSA.  He had been on vancomycin  which was continued. He had a left thora 12/10 with 450cc fluid removed.  His HD catheter was removed on 10/28/2024 as well as an arterial line.  He did have a TTE on 12/11 that showed an EF of 20-25%, G2 DD, mild decrease in RV SF, severe AAS, no vegetations.  TEE was recommended but had been deferred due to patient requiring vasopressor support.  ID was consulted and recommended line holiday.  He had a temporary femoral HD cath placed on 12/15.  ID recommendations included 6 weeks of vancomycin  to end on 12/09/2024.    12/17, he was transferred to [facility] for ongoing management for septic shock as well as for IR evaluation for tunneled HD catheter placement  Pertinent  Medical History  ESRD on HD MWF, Afib, HFrEF, T2DM, LBBB, severe PVD s/p R BKA Past Medical History:  Diagnosis Date   ESRD on hemodialysis (HCC)    a. HD MWF > 10 years.   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 01/2022 Echo: EF 45-50%, glob HK, mod LVH, mildly reduced RV fxn, mild MR, mod-sev MV Ca2+. Mild AS; b. 10/2023 Echo: EF 45-50%, glob HK, GrII DD, mildly reduced RV fxn, RVSP 52.64mmHg. Mild MR/MS, mod TR.   Hypertension    Nonischemic  cardiomyopathy (HCC)    a. 2019 Cath [facility]): Nonobstructive disease; b. 12/2019 Echo: EF 50%; c. 12/2019 MV (Tx w/u [facility]): Ant infarct w/ peri-infarct ischemia. EF 35%; c. 05/2021 MV (Tx w/u [facility]): Cor Ca2+, EF 53%, motion artifact, equiv study, likely low risk.   Nonobstructive coronary artery disease    a.  2019 Cath ([facility]): Left main normal, LAD 30 proximal, RCA 30 distal, EF 25%.   Obesity    Peripheral arterial disease    Peripheral vascular disease    Type 2 diabetes mellitus (HCC)    a. 10/2023 - admission w/ HHS.   Significant Hospital Events: Including procedures, antibiotic start and stop dates in addition to other pertinent events   12/8 admit 12/10 L thora with 450cc removed, transudate, neg cultures 12/11 echo 12/12 HD cath and art line removed 12/15 new femoral HD cath placed 12/17 transfer to [facility] 79M 12/19 HD  12/20 CRRT 12/23 Weaned off levophed  this am. HD trial this am. Transferred to [facility] 12/25 Returned to ICU for hypotension 12/28-remains on Levophed  12/29-remains on Levophed , tolerating dialysis 12/31 was off Levophed  this morning 1/2 on Levophed  and had HD 1/4 changed to neo and vaso, stopped levophed  1/5 HD 1/6 CRRT  Interim History / Subjective:  Awake and alert. Remains flat affect. Denies acute concerns  or pain/discomfort.  NSR Not on [place], below chin On levophed , neo and vasopressin   Objective    Blood pressure (!) 78/52, pulse 81, temperature 98 F (36.7 C), temperature source Oral, resp. rate (!) 25, height 6' 1 (1.854 m), weight 117.3 kg, SpO2 98%.        Intake/Output Summary (Last 24 hours) at 11/24/2024 0636 Last data filed at 11/24/2024 0600 Gross per 24 hour  Intake 1751.97 ml  Output 3420.2 ml  Net -1668.23 ml   Filed Weights   11/22/24 2000 11/23/24 0500 11/24/24 0500  Weight: 118.5 kg 119.2 kg 117.3 kg   Physical Exam:   General: Middle-age, alert, does not appear to be in distress Respiratory: Normal effort  on room air. Clear breath sounds bilaterally Cardiovascular: RRR GI: BS +, soft, NT Extremities: Right below-knee amputation, 2+ pitting edema in left lower extremity neuro: Awake, alert and oriented Psych: flat affect  Lab data reviewed Na 133(chronic/stable) Bicarb 18 Total Bili 3.1--3.4 --2.6 ALP 153--135 AST 30--339 ALT 18--135 Hgb 9.7  Resolved problem list   Assessment and Plan   Chronic hypotension - On midodrine  20 mg TID, Strattera  20 mg BID - On levo, neo and vaso, wean as tolerated w/ MAP goal >60 (will try to come off neo) - Has femoral a-line   Multifocal pneumonia Hypoxemic respiratory failure, resolved -Completed 5 days of CTX -Continue duoneb PRN -Wean supplemental O2 as tolerated, SpO2 goal >92% -New fever, suspect new pneumonia, started on Zosyn  (1/4-  )  Septic shock secondary to MRSA bacteremia - Goal is to complete vancomycin  dosing on 12/09/2024 - Has temporary dialysis catheter in place - Plan for [facility] per nephrology and IR  - Unable to complete TEE at this time - 12/12 Bcx negative, 12/18 Bcx negative, pend 1/5 Bcx  Lactic acidosis  LA up >9 but trending down. Tolerating HD. Coox not consistent w/ cardiogenic shock. Abd exam was soft, NT w/ BS+. Extremities unchanged. Repeat echo w/ same EF, worsening biventricular fxn.   Elevated LFTs Elevated Bili Still elevated. Denies abd pain. RUQ US  w/ single mobile gallstone. No abd pain, fever.  -Trend liver enzymes   Afib w/ RVR, improved Suspect from new sepsis suspect aspiration PNA.  -Appears to be in NSR, HR controlled at this time  Heart failure with reduced ejection fraction of 20 to 25% Severe aortic stenosis - Not on GDMT due to hypotension  - Follows w/ outpatient cardiology, medical management - Volume removal per nephrology  End-stage renal disease on hemodialysis - Appreciate nephrology assistance, CRRT per nephrology  - Has temp HD catheter, will need TDC (spoke w/ Dr. Jerrye,  nephrology to speak w/ IR)  Type 2 diabetes CBG goal 140-180 - SSI-M   Peripheral vascular disease - On aspirin   Depressed mood Poor appetite States no symptoms of abd pain, n/v causing decreased appetite.  -mirtazapine  at bedtime   GOC Palliative following. Discussion w/ pt and pt's daughter who is POA. PMT and CCM expressed concern to both how pt may not tolerate HD outpatient. See 1/6 IPAL note.    Labs   CBC: Recent Labs  Lab 11/19/24 0102 11/20/24 0237 11/20/24 1223 11/20/24 1251 11/21/24 0351 11/23/24 0336 11/24/24 0323  WBC 7.4 9.6  --   --  10.4 10.6* 9.2  HGB 10.8* 11.3* 13.6 13.3 11.3* 10.4* 9.7*  HCT 32.4* 34.2* 40.0 39.0 34.9* 31.2* 29.9*  MCV 78.5* 78.3*  --   --  80.8 79.4* 81.9  PLT 270 293  --   --  258 225 188    Basic Metabolic Panel: Recent Labs  Lab 11/20/24 0237 11/20/24 1223 11/21/24 0351 11/22/24 0457 11/22/24 1632 11/23/24 0336 11/23/24 1613 11/24/24 0323  NA 131*   < > 129* 132* 130* 132* 133* 133*  K 4.5   < > 5.2* 4.6 4.6 4.2 4.2 4.0  CL 93*  --  91* 92* 91* 95* 96* 97*  CO2 25  --  16* 15* 14* 18* 18* 18*  GLUCOSE 201*  --  117* 86 125* 139* 128* 96  BUN 21  --  27* 25* 30* 26* 21 16  CREATININE 4.64*  --  5.61* 4.75* 5.07* 4.21* 3.38* 2.96*  CALCIUM  8.7*  --  9.0 8.8* 8.8* 8.8* 8.6* 8.6*  MG 2.1  --  2.0 2.0  --  2.1  --  2.3  PHOS 3.2  --  4.7* 4.1 4.1 3.1 2.6 2.3*   < > = values in this interval not displayed.   GFR: Estimated Creatinine Clearance: 34.7 mL/min (A) (by C-G formula based on SCr of 2.96 mg/dL (H)). Recent Labs  Lab 11/20/24 0237 11/21/24 0351 11/21/24 1040 11/21/24 1226 11/21/24 1922 11/21/24 2222 11/22/24 1041 11/22/24 1154 11/23/24 0336 11/24/24 0323  PROCALCITON  --   --  1.95  --   --   --   --   --   --   --   WBC 9.6 10.4  --   --   --   --   --   --  10.6* 9.2  LATICACIDVEN  --   --  >9.0*   < > 7.3* 8.3* 6.3* 5.7*  --   --    < > = values in this interval not displayed.    Liver  Function Tests: Recent Labs  Lab 11/21/24 1624 11/22/24 0457 11/22/24 1632 11/23/24 0336 11/23/24 1613 11/24/24 0323  AST 26 30  --   --   --   --   ALT 17 18  --   --   --   --   ALKPHOS 164* 153*  --   --   --   --   BILITOT 3.1* 3.4*  --   --   --   --   PROT 8.4* 8.4*  --   --   --   --   ALBUMIN  3.4* 3.6  3.5 3.6 3.6 3.4* 3.4*   No results for input(s): LIPASE, AMYLASE in the last 168 hours. No results for input(s): AMMONIA in the last 168 hours.  ABG    Component Value Date/Time   PHART 7.449 11/20/2024 1251   PCO2ART 31.5 (L) 11/20/2024 1251   PO2ART 62 (L) 11/20/2024 1251   HCO3 21.9 11/20/2024 1251   TCO2 23 11/20/2024 1251   ACIDBASEDEF 1.0 11/20/2024 1251   O2SAT 73.5 11/21/2024 1214     Coagulation Profile: No results for input(s): INR, PROTIME in the last 168 hours.  Cardiac Enzymes: No results for input(s): CKTOTAL, CKMB, CKMBINDEX, TROPONINI in the last 168 hours.  HbA1C: Hgb A1c MFr Bld  Date/Time Value Ref Range Status  10/24/2024 10:14 AM 8.6 (H) 4.8 - 5.6 % Final    Comment:    (NOTE) Diagnosis of Diabetes The following HbA1c ranges recommended by the American Diabetes Association (ADA) may be used as an aid in the diagnosis of diabetes mellitus.  Hemoglobin             Suggested A1C NGSP%  Diagnosis  <5.7                   Non Diabetic  5.7-6.4                Pre-Diabetic  >6.4                   Diabetic  <7.0                   Glycemic control for                       adults with diabetes.    02/11/2024 02:27 PM 8.4 (H) 4.8 - 5.6 % Final    Comment:    (NOTE) Pre diabetes:          5.7%-6.4%  Diabetes:              >6.4%  Glycemic control for   <7.0% adults with diabetes     CBG: Recent Labs  Lab 11/22/24 2116 11/23/24 0732 11/23/24 1138 11/23/24 1540 11/23/24 2231  GLUCAP 132* 131* 113* 130* 116*      "

## 2024-11-24 NOTE — Progress Notes (Signed)
 Pt refused his lunch and Ensure. Education given and pt verb understanding

## 2024-11-24 NOTE — Progress Notes (Signed)
 " Daily Progress Note   Date: 11/24/2024   Patient Name: [name] KANADY Sr.  DOB: January 27, 1961  MRN: [id number]  Age / Sex: 65 y.o., male  Attending Physician: Pleas Newborn, MD Primary Care Physician: [name] Aquas, MD Admit Date: 10/24/2024 Length of Stay: 31 days  Reason for Follow-up: Establishing goals of care  Past Medical History:  Diagnosis Date   ESRD on hemodialysis (HCC)    a. HD MWF > 10 years.   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 01/2022 Echo: EF 45-50%, glob HK, mod LVH, mildly reduced RV fxn, mild MR, mod-sev MV Ca2+. Mild AS; b. 10/2023 Echo: EF 45-50%, glob HK, GrII DD, mildly reduced RV fxn, RVSP 52.45mmHg. Mild MR/MS, mod TR.   Hypertension    Nonischemic cardiomyopathy (HCC)    a. 2019 Cath [facility]): Nonobstructive disease; b. 12/2019 Echo: EF 50%; c. 12/2019 MV (Tx w/u [facility]): Ant infarct w/ peri-infarct ischemia. EF 35%; c. 05/2021 MV (Tx w/u [facility]): Cor Ca2+, EF 53%, motion artifact, equiv study, likely low risk.   Nonobstructive coronary artery disease    a.  2019 Cath ([facility]): Left main normal, LAD 30 proximal, RCA 30 distal, EF 25%.   Obesity    Peripheral arterial disease    Peripheral vascular disease    Type 2 diabetes mellitus (HCC)    a. 10/2023 - admission w/ HHS.    Assessment & Plan:   HPI/Patient Profile:   64 y.o. male  with past medical history of ESRD on HD, HFrEF (20-25% EF), T2DM, atrial fibrillation admitted on 10/24/2024 with MRSA bacteremia and AHRF 2/2 pneumonia. Per H&P on 10/24/2024 by Pearlean MD, patient presented to [facility] for 2-3 weeks of shortness of breath. CT chest on 10/24/2024 demonstrated multilobar bonchopneumonia. Blood cultures on 10/24/2024 positive for MRSA bacteremia. 10/26/2024 had left thoracentesis with 450 mL transudate fluid removed with negative cultures. Transferred to [facility] 11/02/2024 for management of septic shock and IR evaluation for [facility] placement which has not been completed due  to continued pressor needs.  Patient remains on 3 pressors at rest and on days without dialysis.    Palliative medicine consulted for goals of care conversation.  SUMMARY OF RECOMMENDATIONS Full code, full scope Daughter is HCPOA, patient agreeable to any decision the patient makes, family plans on transitioning to making patient DNR and possibly comfort measures in the next 48-72 hours if he does not improve  Symptom Management:  Per primary team  Code Status: Full Code  Prognosis: Unable to determine  Discharge Planning: To Be Determined  Discussed with: Elicia MD 11/24/2024 about family's plan to continue current measures and that they will consider DNR in the next 48-72 hours and possible transition to comfort measures.   Subjective:   Subjective: Chart Reviewed. Updates received. Patient Assessed. Created space and opportunity for patient  and family to explore thoughts and feelings regarding current medical situation.  Today's Discussion:  Visited patient at bedside without any visitors present. Patient undergoing dialysis during visit, was somnolent but does awake to voice and participate in conversation. Patient denies any discomfort and overall continues to feel okay. Patient expressed that he is agreeable to continuing current measures and whatever decision his daughter makes. Discussed with patient that his daughter may make a decision in the next 48-72 hours.   Review of Systems  Constitutional:  Positive for fatigue.  All other systems reviewed and are negative.   Objective:   Primary Diagnoses: Present  on Admission:  Multifocal pneumonia  Below-knee amputation of right lower extremity (HCC)  Chronic systolic CHF (congestive heart failure) (HCC)  Type 2 diabetes mellitus with diabetic chronic kidney disease (HCC)  Severe sepsis with septic shock  MRSA bacteremia  Hypotension  CAD (coronary artery disease)  Peripheral vascular disease, unspecified   Vital  Signs:  BP (!) 78/52   Pulse 79   Temp 98.2 F (36.8 C) (Oral)   Resp (!) 23   Ht 6' 1 (1.854 m)   Wt 117.3 kg   SpO2 95%   BMI 34.12 kg/m   Physical Exam Constitutional:      Comments: Somnolent but awakes to voice.   HENT:     Head: Normocephalic and atraumatic.  Eyes:     Extraocular Movements: Extraocular movements intact.  Cardiovascular:     Rate and Rhythm: Normal rate.  Pulmonary:     Effort: Pulmonary effort is normal.  Skin:    General: Skin is warm and dry.  Neurological:     General: No focal deficit present.     Mental Status: He is alert.  Psychiatric:     Comments: Flat affect     Palliative Assessment/Data: 40%   Existing Vynca/ACP Documentation: HCPOA - daughter [name] in EMR  Thank you for allowing us  to participate in the care of [name] MCBRYAR Sr. PMT will continue to support holistically.  I personally spent a total of 25 minutes in the care of the patient today including preparing to see the patient, getting/reviewing separately obtained history, performing a medically appropriate exam/evaluation, referring and communicating with other health care professionals, and documenting clinical information in the EHR.  Arelys Glassco E Annalaya Wile, PA-C  Palliative Medicine Team  Team Phone # [phone] (Nights/Weekends) 11/24/2024 9:07 AM  "

## 2024-11-25 DIAGNOSIS — I4891 Unspecified atrial fibrillation: Secondary | ICD-10-CM | POA: Diagnosis not present

## 2024-11-25 DIAGNOSIS — I5022 Chronic systolic (congestive) heart failure: Secondary | ICD-10-CM | POA: Diagnosis not present

## 2024-11-25 DIAGNOSIS — N186 End stage renal disease: Secondary | ICD-10-CM | POA: Diagnosis not present

## 2024-11-25 DIAGNOSIS — I9589 Other hypotension: Secondary | ICD-10-CM | POA: Diagnosis not present

## 2024-11-25 DIAGNOSIS — I132 Hypertensive heart and chronic kidney disease with heart failure and with stage 5 chronic kidney disease, or end stage renal disease: Secondary | ICD-10-CM | POA: Diagnosis not present

## 2024-11-25 DIAGNOSIS — I350 Nonrheumatic aortic (valve) stenosis: Secondary | ICD-10-CM | POA: Diagnosis not present

## 2024-11-25 DIAGNOSIS — A4102 Sepsis due to Methicillin resistant Staphylococcus aureus: Secondary | ICD-10-CM | POA: Diagnosis not present

## 2024-11-25 DIAGNOSIS — R6521 Severe sepsis with septic shock: Secondary | ICD-10-CM | POA: Diagnosis not present

## 2024-11-25 DIAGNOSIS — E1122 Type 2 diabetes mellitus with diabetic chronic kidney disease: Secondary | ICD-10-CM | POA: Diagnosis not present

## 2024-11-25 LAB — CBC
HCT: 28.9 % — ABNORMAL LOW (ref 39.0–52.0)
Hemoglobin: 9.6 g/dL — ABNORMAL LOW (ref 13.0–17.0)
MCH: 27.2 pg (ref 26.0–34.0)
MCHC: 33.2 g/dL (ref 30.0–36.0)
MCV: 81.9 fL (ref 80.0–100.0)
Platelets: 153 K/uL (ref 150–400)
RBC: 3.53 MIL/uL — ABNORMAL LOW (ref 4.22–5.81)
RDW: 26.4 % — ABNORMAL HIGH (ref 11.5–15.5)
WBC: 8 K/uL (ref 4.0–10.5)
nRBC: 1.7 % — ABNORMAL HIGH (ref 0.0–0.2)

## 2024-11-25 LAB — RENAL FUNCTION PANEL
Albumin: 3.3 g/dL — ABNORMAL LOW (ref 3.5–5.0)
Albumin: 3.2 g/dL — ABNORMAL LOW (ref 3.5–5.0)
Anion gap: 18 — ABNORMAL HIGH (ref 5–15)
Anion gap: 21 — ABNORMAL HIGH (ref 5–15)
BUN: 13 mg/dL (ref 8–23)
BUN: 12 mg/dL (ref 8–23)
CO2: 18 mmol/L — ABNORMAL LOW (ref 22–32)
CO2: 15 mmol/L — ABNORMAL LOW (ref 22–32)
Calcium: 8.4 mg/dL — ABNORMAL LOW (ref 8.9–10.3)
Calcium: 8.5 mg/dL — ABNORMAL LOW (ref 8.9–10.3)
Chloride: 97 mmol/L — ABNORMAL LOW (ref 98–111)
Chloride: 98 mmol/L (ref 98–111)
Creatinine, Ser: 2.44 mg/dL — ABNORMAL HIGH (ref 0.61–1.24)
Creatinine, Ser: 2.34 mg/dL — ABNORMAL HIGH (ref 0.61–1.24)
GFR, Estimated: 29 mL/min — ABNORMAL LOW
GFR, Estimated: 31 mL/min — ABNORMAL LOW
Glucose, Bld: 122 mg/dL — ABNORMAL HIGH (ref 70–99)
Glucose, Bld: 150 mg/dL — ABNORMAL HIGH (ref 70–99)
Phosphorus: 2.6 mg/dL (ref 2.5–4.6)
Phosphorus: 2.5 mg/dL (ref 2.5–4.6)
Potassium: 4.2 mmol/L (ref 3.5–5.1)
Potassium: 4.1 mmol/L (ref 3.5–5.1)
Sodium: 133 mmol/L — ABNORMAL LOW (ref 135–145)
Sodium: 135 mmol/L (ref 135–145)

## 2024-11-25 LAB — HEPATIC FUNCTION PANEL
ALT: 116 U/L — ABNORMAL HIGH (ref 0–44)
AST: 221 U/L — ABNORMAL HIGH (ref 15–41)
Albumin: 3.2 g/dL — ABNORMAL LOW (ref 3.5–5.0)
Alkaline Phosphatase: 131 U/L — ABNORMAL HIGH (ref 38–126)
Bilirubin, Direct: 1.9 mg/dL — ABNORMAL HIGH (ref 0.0–0.2)
Indirect Bilirubin: 0.9 mg/dL (ref 0.3–0.9)
Total Bilirubin: 2.9 mg/dL — ABNORMAL HIGH (ref 0.0–1.2)
Total Protein: 7.9 g/dL (ref 6.5–8.1)

## 2024-11-25 LAB — GLUCOSE, CAPILLARY
Glucose-Capillary: 110 mg/dL — ABNORMAL HIGH (ref 70–99)
Glucose-Capillary: 107 mg/dL — ABNORMAL HIGH (ref 70–99)
Glucose-Capillary: 138 mg/dL — ABNORMAL HIGH (ref 70–99)
Glucose-Capillary: 202 mg/dL — ABNORMAL HIGH (ref 70–99)

## 2024-11-25 LAB — MRSA NEXT GEN BY PCR, NASAL: MRSA by PCR Next Gen: DETECTED — AB

## 2024-11-25 LAB — MAGNESIUM: Magnesium: 2.3 mg/dL (ref 1.7–2.4)

## 2024-11-25 MED ORDER — CHLORHEXIDINE GLUCONATE CLOTH 2 % EX PADS
6.0000 | MEDICATED_PAD | CUTANEOUS | Status: DC
  Administered 2024-11-26 – 2024-11-30 (×5): 6 via TOPICAL

## 2024-11-25 MED ORDER — MUPIROCIN 2 % EX OINT
1.0000 | TOPICAL_OINTMENT | Freq: Two times a day (BID) | CUTANEOUS | Status: AC
  Administered 2024-11-25 – 2024-11-30 (×10): 1 via NASAL
  Filled 2024-11-25: qty 22

## 2024-11-25 MED ORDER — K PHOS MONO-SOD PHOS DI & MONO 155-852-130 MG PO TABS
500.0000 mg | ORAL_TABLET | ORAL | Status: AC
  Administered 2024-11-25 (×4): 500 mg
  Filled 2024-11-25 (×4): qty 2

## 2024-11-25 NOTE — Progress Notes (Signed)
 "  NAME:  [name] [name]., MRN:  [id number], DOB:  1961-09-18, LOS: 32 ADMISSION DATE:  10/24/2024, CONSULTATION DATE:  12/17 REFERRING MD:  Ricky - [facility], CHIEF COMPLAINT:  hypotension  History of Present Illness:  CRESTON KLAS Sr. is a 64 y.o. male who has a PMH as outlined below including but not limited to ESRD on HD MWF, A-fib, systolic CHF, hypertension, LBBB, DM.  He presented to [facility] ED on 10/24/2024 with dyspnea x roughly 2-3 weeks. He had been seen in the ED a few days early on 12/2 for dysuria and was started on a course of Cipro  which she started the following day and stated that he had been taking as prescribed.     CT chest that demonstrated multifocal pneumonia and small effusions.  Blood cultures ultimately grew MRSA.  He had been on vancomycin  which was continued. He had a left thora 12/10 with 450cc fluid removed.  His HD catheter was removed on 10/28/2024 as well as an arterial line.  He did have a TTE on 12/11 that showed an EF of 20-25%, G2 DD, mild decrease in RV SF, severe AAS, no vegetations.  TEE was recommended but had been deferred due to patient requiring vasopressor support.  ID was consulted and recommended line holiday.  He had a temporary femoral HD cath placed on 12/15.  ID recommendations included 6 weeks of vancomycin  to end on 12/09/2024.    12/17, he was transferred to [facility] for ongoing management for septic shock as well as for IR evaluation for tunneled HD catheter placement  Pertinent  Medical History  ESRD on HD MWF, Afib, HFrEF, T2DM, LBBB, severe PVD s/p R BKA Past Medical History:  Diagnosis Date   ESRD on hemodialysis (HCC)    a. HD MWF > 10 years.   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 01/2022 Echo: EF 45-50%, glob HK, mod LVH, mildly reduced RV fxn, mild MR, mod-sev MV Ca2+. Mild AS; b. 10/2023 Echo: EF 45-50%, glob HK, GrII DD, mildly reduced RV fxn, RVSP 52.36mmHg. Mild MR/MS, mod TR.   Hypertension    Nonischemic  cardiomyopathy (HCC)    a. 2019 Cath [facility]): Nonobstructive disease; b. 12/2019 Echo: EF 50%; c. 12/2019 MV (Tx w/u [facility]): Ant infarct w/ peri-infarct ischemia. EF 35%; c. 05/2021 MV (Tx w/u [facility]): Cor Ca2+, EF 53%, motion artifact, equiv study, likely low risk.   Nonobstructive coronary artery disease    a.  2019 Cath ([place] [facility]): Left main normal, LAD 30 proximal, RCA 30 distal, EF 25%.   Obesity    Peripheral arterial disease    Peripheral vascular disease    Type 2 diabetes mellitus (HCC)    a. 10/2023 - admission w/ HHS.   Significant Hospital Events: Including procedures, antibiotic start and stop dates in addition to other pertinent events   12/8 admit 12/10 L thora with 450cc removed, transudate, neg cultures 12/11 echo 12/12 HD cath and art line removed 12/15 new femoral HD cath placed 12/17 transfer to [facility] 50M 12/19 HD  12/20 CRRT 12/23 Weaned off levophed  this am. HD trial this am. Transferred to [facility] 12/25 Returned to ICU for hypotension 12/28-remains on Levophed  12/29-remains on Levophed , tolerating dialysis 12/31 was off Levophed  this morning 1/2 on Levophed  and had HD 1/4 changed to neo and vaso, stopped levophed  1/5 HD 1/6 CRRT and GOC discussion w/ pt's daughter   Interim History / Subjective:   Awake  and alert. Remains flat affect. Denies acute concerns or pain/discomfort.  Remains on CRRT.  NSR and on RA.  On levophed  and vasopressin , off neo last night   Objective    Blood pressure (!) 78/52, pulse 87, temperature (!) 97.5 F (36.4 C), temperature source Axillary, resp. rate (!) 25, height 6' 1 (1.854 m), weight 117.3 kg, SpO2 96%.        Intake/Output Summary (Last 24 hours) at 11/25/2024 0644 Last data filed at 11/25/2024 0600 Gross per 24 hour  Intake 1269.91 ml  Output 3338 ml  Net -2068.09 ml   Filed Weights   11/22/24 2000 11/23/24 0500 11/24/24 0500  Weight: 118.5 kg 119.2 kg 117.3 kg   Physical Exam:   General:  Ill-appearing, alert, not in acute distress Respiratory: Normal effort on room air. Clear breath sounds bilaterally Cardiovascular: RRR GI: BS +, soft, NT Extremities: Right below-knee amputation, 2-3+ pitting edema in left lower extremity neuro: Awake, alert and oriented Psych: flat affect  Lab data reviewed Na 133 (chronic/stable) Bicarb 18 Total Bili 3.4 --2.6--2.9 (direct/conjugated elevated) ALP 135--131 AST 339--221 ALT 135--116 Hgb 9.7--9.6  Resolved problem list   Assessment and Plan   Chronic hypotension - On midodrine  20 mg TID, Strattera  20 mg BID - On levo and vaso, wean as tolerated w/ MAP goal >60  - Has femoral a-line   Multifocal pneumonia Hypoxemic respiratory failure, resolved -Completed 5 days of CTX -Completed 5 days of Zosyn  (1/4- 1/8 ) -Continue duoneb PRN  Septic shock secondary to MRSA bacteremia - Goal is to complete vancomycin  dosing on 12/09/2024 - Has temporary dialysis catheter in place - Unable to complete TEE at this time - 12/12 Bcx negative, 12/18 Bcx negative - 1/5 Bcx NGTD  Lactic acidosis, improving LA up >9 but trending down. Tolerating HD. Coox not consistent w/ cardiogenic shock. Abd exam was soft, NT w/ BS+. Extremities unchanged. Repeat echo w/ same EF, worsening biventricular fxn. See GOC.   Elevated LFTs Elevated Bili Still elevated but improving. Denies abd pain and NT on abd exam. Afebrile. UQ US  w/ single mobile gallstone.  -Trend liver enzymes and monitor exam and vitals   Afib w/ RVR, improved Suspect from new sepsis suspect aspiration PNA.  -Appears to be in NSR, HR controlled at this time  Heart failure with reduced ejection fraction of 20 to 25% Severe aortic stenosis - Not on GDMT due to hypotension  - Follows w/ outpatient cardiology, medical management - Volume removal per nephrology  End-stage renal disease on hemodialysis - Appreciate nephrology assistance, CRRT per nephrology  - Has temp HD catheter,  had plans for [facility] but limited due to pressor needs (spoke w/ Dr. Jerrye, nephrology to speak w/ IR)  Type 2 diabetes CBG goal 140-180. Poor appetite so insulin  d/c.   Peripheral vascular disease - On home aspirin   Depressed mood Poor appetite States no symptoms of abd pain, n/v causing decreased appetite. Tearful and flat affect.  -mirtazapine  at bedtime   GOC Palliative following. Discussion w/ pt and pt's daughter who is POA. PMT and CCM expressed concern to both how pt may not tolerate HD outpatient. See 1/6 IPAL note.  -Patient states will follow daughter's decision.  -He is aware that if he is unable to tolerate HD then he will die.  -Daughter is agreeable for comfort focused care if remains in current condition or worsening, would like approx 5 days since [facility] meeting 1/6.    Labs   CBC: Recent Labs  Lab 11/20/24 0237 11/20/24 1223 11/20/24 1251 11/21/24 0351 11/23/24 0336 11/24/24 0323 11/25/24 0320  WBC 9.6  --   --  10.4 10.6* 9.2 8.0  HGB 11.3*   < > 13.3 11.3* 10.4* 9.7* 9.6*  HCT 34.2*   < > 39.0 34.9* 31.2* 29.9* 28.9*  MCV 78.3*  --   --  80.8 79.4* 81.9 81.9  PLT 293  --   --  258 225 188 153   < > = values in this interval not displayed.    Basic Metabolic Panel: Recent Labs  Lab 11/21/24 0351 11/22/24 0457 11/22/24 1632 11/23/24 0336 11/23/24 1613 11/24/24 0323 11/24/24 1520 11/25/24 0320  NA 129* 132*   < > 132* 133* 133* 135 133*  K 5.2* 4.6   < > 4.2 4.2 4.0 4.2 4.2  CL 91* 92*   < > 95* 96* 97* 99 97*  CO2 16* 15*   < > 18* 18* 18* 17* 18*  GLUCOSE 117* 86   < > 139* 128* 96 96 122*  BUN 27* 25*   < > 26* 21 16 14 13   CREATININE 5.61* 4.75*   < > 4.21* 3.38* 2.96* 2.53* 2.44*  CALCIUM  9.0 8.8*   < > 8.8* 8.6* 8.6* 8.3* 8.4*  MG 2.0 2.0  --  2.1  --  2.3  --  2.3  PHOS 4.7* 4.1   < > 3.1 2.6 2.3* 2.1* 2.6   < > = values in this interval not displayed.   GFR: Estimated Creatinine Clearance: 42.1 mL/min (A) (by C-G formula based on SCr  of 2.44 mg/dL (H)). Recent Labs  Lab 11/21/24 0351 11/21/24 1040 11/21/24 1226 11/21/24 1922 11/21/24 2222 11/22/24 1041 11/22/24 1154 11/23/24 0336 11/24/24 0323 11/25/24 0320  PROCALCITON  --  1.95  --   --   --   --   --   --   --   --   WBC 10.4  --   --   --   --   --   --  10.6* 9.2 8.0  LATICACIDVEN  --  >9.0*   < > 7.3* 8.3* 6.3* 5.7*  --   --   --    < > = values in this interval not displayed.    Liver Function Tests: Recent Labs  Lab 11/21/24 1624 11/22/24 0457 11/22/24 1632 11/23/24 0336 11/23/24 1613 11/24/24 0323 11/24/24 1520 11/25/24 0320  AST 26 30  --   --   --  339*  --  221*  ALT 17 18  --   --   --  135*  --  116*  ALKPHOS 164* 153*  --   --   --  135*  --  131*  BILITOT 3.1* 3.4*  --   --   --  2.6*  --  2.9*  PROT 8.4* 8.4*  --   --   --  8.0  --  7.9  ALBUMIN  3.4* 3.6  3.5   < > 3.6 3.4* 3.4*  3.4* 3.3* 3.3*  3.2*   < > = values in this interval not displayed.   No results for input(s): LIPASE, AMYLASE in the last 168 hours. No results for input(s): AMMONIA in the last 168 hours.  ABG    Component Value Date/Time   PHART 7.449 11/20/2024 1251   PCO2ART 31.5 (L) 11/20/2024 1251   PO2ART 62 (L) 11/20/2024 1251   HCO3 21.9 11/20/2024 1251   TCO2 23 11/20/2024 1251  ACIDBASEDEF 1.0 11/20/2024 1251   O2SAT 73.5 11/21/2024 1214     Coagulation Profile: No results for input(s): INR, PROTIME in the last 168 hours.  Cardiac Enzymes: No results for input(s): CKTOTAL, CKMB, CKMBINDEX, TROPONINI in the last 168 hours.  HbA1C: Hgb A1c MFr Bld  Date/Time Value Ref Range Status  10/24/2024 10:14 AM 8.6 (H) 4.8 - 5.6 % Final    Comment:    (NOTE) Diagnosis of Diabetes The following HbA1c ranges recommended by the American Diabetes Association (ADA) may be used as an aid in the diagnosis of diabetes mellitus.  Hemoglobin             Suggested A1C NGSP%              Diagnosis  <5.7                   Non  Diabetic  5.7-6.4                Pre-Diabetic  >6.4                   Diabetic  <7.0                   Glycemic control for                       adults with diabetes.    02/11/2024 02:27 PM 8.4 (H) 4.8 - 5.6 % Final    Comment:    (NOTE) Pre diabetes:          5.7%-6.4%  Diabetes:              >6.4%  Glycemic control for   <7.0% adults with diabetes     CBG: Recent Labs  Lab 11/23/24 2231 11/24/24 0731 11/24/24 1128 11/24/24 1534 11/24/24 2208  GLUCAP 116* 97 95 101* 132*      "

## 2024-11-25 NOTE — Plan of Care (Signed)
" °  Medical records reviewed including progress notes, labs, imaging and MAR.  Per nephrologist progress notes from 11/25/2024, patient is on severe shock again severe CRRT needed to be restarted on 11/22/2024, plan to continue on CRRT.  Patient on 2 pressors (Levophed  and vasopressin ) today.  Patient/family's goal are clear for full code and full scope of treatment at this time.  Daughter agreeable to transition to comfort focused care if condition remains or worsening, would like approximately 5 days since last goals of care discussion 11/22/2024.  PMT will continue to follow for acute needs.  Thank you for referral and allowing PMT to assist in Mr./Ms. [name]'s care.   [name]. Lincoln Kleiner Jr., AGNP-C Palliative Medicine Team Phone: [phone]  NO CHARGE     "

## 2024-11-25 NOTE — Plan of Care (Signed)
" °  Problem: Education: Goal: Knowledge of General Education information will improve Description: Including pain rating scale, medication(s)/side effects and non-pharmacologic comfort measures Outcome: Progressing   Problem: Health Behavior/Discharge Planning: Goal: Ability to manage health-related needs will improve Outcome: Progressing   Problem: Clinical Measurements: Goal: Ability to maintain clinical measurements within normal limits will improve Outcome: Progressing Goal: Will remain free from infection Outcome: Progressing Goal: Diagnostic test results will improve Outcome: Progressing Goal: Respiratory complications will improve Outcome: Progressing Goal: Cardiovascular complication will be avoided Outcome: Progressing   Problem: Activity: Goal: Risk for activity intolerance will decrease Outcome: Progressing   Problem: Nutrition: Goal: Adequate nutrition will be maintained Outcome: Progressing   Problem: Elimination: Goal: Will not experience complications related to bowel motility Outcome: Progressing   Problem: Pain Managment: Goal: General experience of comfort will improve and/or be controlled Outcome: Progressing   Problem: Skin Integrity: Goal: Risk for impaired skin integrity will decrease Outcome: Progressing   Problem: Activity: Goal: Ability to tolerate increased activity will improve Outcome: Progressing   Problem: Clinical Measurements: Goal: Ability to maintain a body temperature in the normal range will improve Outcome: Progressing   Problem: Respiratory: Goal: Ability to maintain adequate ventilation will improve Outcome: Progressing Goal: Ability to maintain a clear airway will improve Outcome: Progressing   Problem: Education: Goal: Ability to describe self-care measures that may prevent or decrease complications (Diabetes Survival Skills Education) will improve Outcome: Progressing Goal: Individualized Educational Video(s) Outcome:  Progressing   Problem: Metabolic: Goal: Ability to maintain appropriate glucose levels will improve Outcome: Progressing   Problem: Skin Integrity: Goal: Risk for impaired skin integrity will decrease Outcome: Progressing   Problem: Tissue Perfusion: Goal: Adequacy of tissue perfusion will improve Outcome: Progressing   Problem: Cardiac: Goal: Ability to achieve and maintain adequate cardiopulmonary perfusion will improve Outcome: Progressing   "

## 2024-11-25 NOTE — Progress Notes (Signed)
 [facility] NEPHROLOGY PROGRESS NOTE   Subjective:   Seen in room Net neg 2.1 L yest, down 117kg Vaso at 0.03, levo at 5 mcg/ min Neo gtt was dc'd yesterday   Presentation summary: 64 year old man with past medical history significant for hypertension, type 2 diabetes mellitus, status post right below-knee amputation, nonobstructive coronary artery disease, congestive heart failure with reduced ejection fraction, obesity and end-stage renal disease on hemodialysis.  Admitted to the hospital yesterday with SOB. CT scan of the chest showed multifocal bronchopneumonia with small right and moderate loculated left pleural effusion.  He was started on CAP coverage with vancomycin  and cefepime .  Overnight noted to be increasingly hypotensive prompting need to initiate Levophed .  He also has positive blood cultures (4 out of 4) for gram-positive cocci, which indwelling TDC.    Objective Vital signs in last 24 hours: Vitals:   11/19/24 1215 11/19/24 1230 11/19/24 1245 11/19/24 1255  BP: (!) 85/44 (!) 93/45 (!) 90/50 (!) 89/53  Pulse: 89 90 91 90  Resp: (!) 23 20 (!) 26 (!) 25  Temp:      TempSrc:      SpO2: 96% 97% 97% 97%  Weight:      Height:        Dialysis Orders: MWF RKC 4h  B400  101.9kg  2K bath  TDC  Hep 6000 - Hectoral IV q HD - No ESA/iron   Physical Exam:        General responding today, lying flat, no ^wob Lungs clear to auscultation  Heart S1S2 no rub Abdomen soft nontender obese habitus Ext diffuse 1-2+ edema x 4 ext, R bka Neuro more alert  Access groin temp HD catheter in place    Assessment/ Plan:  # ESRD:  - s/p CRRT 2/20-12/22, then was back on iHD - usual HD MWF  - severe shock again, so that CRRT needed to be re-started 1/06 - cont CRRT    # Septic shock - improved initially from MRSA bacteremia - became severely hypotensive again - on 2 pressors today  # MRSA bacteremia - issue on admission - bcx's + 12/08, f/u cx's  negative - TDC removed - temp line placed 12/15 - IR consulted but remains too sick for new TDC - cont IV abx per pmd  # Hypervolemia - diffuse 3rd spacing, UE/ LE edema - cont ^ UF 65- 125 cc/hr w/ CRRT   # Anemia of ESRD:    - s/p PRBC - iron  saturation 8%, gave IV ferric gluconate 250mg  x 2  - esa darbe at 60 mcg/wk on hold w/ Hb > 10-11  # GOC - per CCM notes,  - palliative is following - per CCM notes, family is considering transition to comfort care   Rob Daleysa Kristiansen  MD  [facility] 11/25/2024, 1:16 PM  Recent Labs  Lab 11/24/24 0323 11/24/24 1520 11/25/24 0320  HGB 9.7*  --  9.6*  ALBUMIN  3.4*  3.4* 3.3* 3.3*  3.2*  CALCIUM  8.6* 8.3* 8.4*  PHOS 2.3* 2.1* 2.6  CREATININE 2.96* 2.53* 2.44*  K 4.0 4.2 4.2    Inpatient medications:  aspirin  EC  81 mg Oral Q breakfast   atomoxetine   20 mg Oral BID   Chlorhexidine  Gluconate Cloth  6 each Topical Q0600   Chlorhexidine  Gluconate Cloth  6 each Topical Q0600   feeding supplement  237 mL Oral BID BM   guaiFENesin   600 mg Oral BID   heparin   5,000 Units Subcutaneous Q8H  insulin  aspart  0-15 Units Subcutaneous TID WC   midodrine   20 mg Oral Q8H   mirtazapine   7.5 mg Oral QHS   multivitamin  1 tablet Oral QHS   pantoprazole   40 mg Oral Daily   phosphorus  500 mg Per Tube Q4H   sevelamer  carbonate  800 mg Oral TID WC    anticoagulant sodium citrate      anticoagulant sodium citrate      norepinephrine  (LEVOPHED ) Adult infusion 5 mcg/min (11/25/24 1300)   prismasol  BGK 4/2.5 1,500 mL/hr at 11/25/24 1224   prismasol  BGK 4/2.5 400 mL/hr at 11/25/24 0919   prismasol  BGK 4/2.5 400 mL/hr at 11/25/24 0228   vancomycin  Stopped (11/25/24 1022)   vasopressin  0.03 Units/min (11/25/24 1300)   acetaminophen  **OR** acetaminophen , alteplase , anticoagulant sodium citrate , anticoagulant sodium citrate , guaiFENesin -dextromethorphan , heparin , heparin , heparin , hydrOXYzine , ipratropium-albuterol , lidocaine  (PF), lidocaine  (PF),  lidocaine -prilocaine , mouth rinse, pentafluoroprop-tetrafluoroeth, pentafluoroprop-tetrafluoroeth, phenol, polyethylene glycol, trimethobenzamide 

## 2024-11-25 NOTE — Progress Notes (Signed)
 Physical Therapy Treatment Patient Details Name: [name] LUNDEEN Sr. MRN: [id number] DOB: Feb 21, 1961 Today's Date: 11/25/2024   History of Present Illness [name] ROCKS Sr. is a 64 y.o. male who presented to [facility] ED on 10/24/2024 with dyspnea x roughly 2-3 weeks. Pt placed on CRRT for volume overload, off 12/22. 12/25. Returned to ICU for hypotension. Back on CRRT 1/6 PMH: ESRD on HD MWF, A-fib, systolic CHF, hypertension, LBBB, DM.    PT Comments  Due to syncopal episode upon sitting last visit, utilized egress mode on bed to slowly elevate HOB and lower LEs until HOB ~70 degrees. Tolerated this without issue for approx 5 min working on repositioning himself with moderate assist using rail. Completed some LE exercises RLE and Lt ankle and knee (avoiding activity around Lt hip while undergoing CRRT.) Patient became dizzy after exertion pulling himself forward away from [facility] a couple of times using rail and therapist's hands respectively. More lethargic but MAP around 70. HOB down to 30 degrees before symptoms improved. Patient will continue to benefit from skilled physical therapy services to further improve independence with functional mobility.    If plan is discharge home, recommend the following: Assist for transportation;Help with stairs or ramp for entrance;Two people to help with walking and/or transfers;Two people to help with bathing/dressing/bathroom;Assistance with cooking/housework;Supervision due to cognitive status   Can travel by private vehicle     No  Equipment Recommendations  Wheelchair (measurements PT);Wheelchair cushion (measurements PT);Hoyer lift    Recommendations for Other Services       Precautions / Restrictions Precautions Precautions: Fall;Other (comment) Recall of Precautions/Restrictions: Impaired Precaution/Restrictions Comments: femoral lines; R BKA, has prosthesis Required Braces or Orthoses: Other Brace Restrictions Weight Bearing Restrictions Per  Provider Order: No Other Position/Activity Restrictions: femoral lines     Mobility  Bed Mobility Overal bed mobility: Needs Assistance Bed Mobility: Supine to Sit     Supine to sit: Total assist, HOB elevated     General bed mobility comments: Utilized egrees mode on bed to raise trunk slowly while monitoring MAP. Tolerated well into full upright sitting position at ~70 deg with LE dependent. MAP maintained 72 to 75 via a-line. Developed dizziness despite favorable hemodynamics displayed. Required HOB decline back to 30 for symptoms to improve.    Transfers                        Ambulation/Gait               General Gait Details: pt non-ambulatory at baseline   Stairs             Wheelchair Mobility     Tilt Bed    Modified Rankin (Stroke Patients Only)       Balance Overall balance assessment: Needs assistance Sitting-balance support: Bilateral upper extremity supported Sitting balance-Leahy Scale: Zero Sitting balance - Comments: pulls back away from bed with HOB elevated in egress chair position.                                    Communication Communication Communication: No apparent difficulties  Cognition Arousal: Alert Behavior During Therapy: Flat affect   PT - Cognitive impairments: No family/caregiver present to determine baseline                         Following commands: Impaired Following commands  impaired: Follows one step commands inconsistently, Follows one step commands with increased time    Cueing Cueing Techniques: Verbal cues, Tactile cues, Gestural cues  Exercises General Exercises - Lower Extremity Ankle Circles/Pumps: AROM, Left, 10 reps, Supine Quad Sets: Strengthening, Both, 10 reps, Supine Short Arc Quad: Both, 10 reps, AROM, Strengthening, Seated Straight Leg Raises: Strengthening, AAROM, Right, 10 reps, Supine Other Exercises Other Exercises: Seated with back support in egress  chair position. Utilized rails and therapist's hands to pull his back away from bed and hold for several seconds. Completed twice but complains of increased dizziness worsening on second attempt.    General Comments General comments (skin integrity, edema, etc.): MAP 69-75 throughout Session. Limited by dizziness, increased lethargy after exerting himself. SpO2 98% on RA, 10)% on 4L. HR 89.      Pertinent Vitals/Pain Pain Assessment Pain Assessment: No/denies pain    Home Living                          Prior Function            PT Goals (current goals can now be found in the care plan section) Acute Rehab PT Goals Patient Stated Goal: get better Progress towards PT goals: Progressing toward goals    Frequency    Min 2X/week      PT Plan      Co-evaluation              AM-PAC PT 6 Clicks Mobility   Outcome Measure  Help needed turning from your back to your side while in a flat bed without using bedrails?: A Lot Help needed moving from lying on your back to sitting on the side of a flat bed without using bedrails?: Total Help needed moving to and from a bed to a chair (including a wheelchair)?: Total Help needed standing up from a chair using your arms (e.g., wheelchair or bedside chair)?: Total Help needed to walk in hospital room?: Total Help needed climbing 3-5 steps with a railing? : Total 6 Click Score: 7    End of Session Equipment Utilized During Treatment: Oxygen Activity Tolerance: Treatment limited secondary to medical complications (Comment);Patient limited by fatigue (dizziness, fatigue) Patient left: in bed;with call bell/phone within reach;with bed alarm set Nurse Communication: Mobility status;Need for lift equipment PT Visit Diagnosis: Muscle weakness (generalized) (M62.81);Other abnormalities of gait and mobility (R26.89);Difficulty in walking, not elsewhere classified (R26.2);Other symptoms and signs involving the nervous system  (R29.898)     Time: 8853-8783 PT Time Calculation (min) (ACUTE ONLY): 30 min  Charges:    $Therapeutic Exercise: 8-22 mins $Therapeutic Activity: 8-22 mins PT General Charges $$ ACUTE PT VISIT: 1 Visit                     Leontine Roads, PT, DPT [facility]  Rehabilitation Services Physical Therapist Office: [phone] Website: [email or web address]    Leontine GORMAN Roads 11/25/2024, 4:05 PM

## 2024-11-25 NOTE — Progress Notes (Signed)
 Pt refused his breakfast this morning but was able to encourage him to take is [employer].

## 2024-11-25 NOTE — Progress Notes (Signed)
 Noted that pt still in ICU, contacted [facility] [place] to inform of this and that pt may not be starting on Tuesday 1/13 as planned. Will provide better update to clinic on Monday. Will continue to follow.   Lavanda Jan Olano Dialysis Navigator [phone]

## 2024-11-25 NOTE — Progress Notes (Signed)
 Nutrition Follow-up  DOCUMENTATION CODES:  Not applicable  INTERVENTION:  Continue current diet as ordered Encourage PO intake Ok for family to bring food items pt may enjoy from home Continue Ensure Plus High Protein BID daily, each supplement provides 350 kcal and 20 grams of protein  Renal MVI with minerals daily If indicated in GOC and pt continues to refuse to eat, recommend placement of cortrak tube and initiation of enteral feeds.  NUTRITION DIAGNOSIS:  Increased nutrient needs related to chronic illness as evidenced by estimated needs. - remains applicable  GOAL:  Patient will meet greater than or equal to 90% of their needs - progressing  MONITOR:  PO intake, Supplement acceptance, Labs, Weight trends  REASON FOR ASSESSMENT:  Consult Assessment of nutrition requirement/status (CRRT)  ASSESSMENT:  Pt with PMH significant for: ESRD-HD, afib, HFrEF, HTN, T2DM, CAD, severe PVD s/p R BKA. Presented to [facility] ED (12/08) and found to have multifocal PNA on CT and small effusions. Bcx positive for MRSA. Transferred to [facility] thirteen days after admission for management of septic shock and IR eval for tunneled HD catheter placement.  12/08 - admitted 12/10 - L thoracentesis: yield 12/11 - echo: EF 20-25% 12/12 - line holiday 12/15 - new femoral HD cath placed 12/17 - txr to [facility] ICU 12/20 - CRRT initiated 12/21 - a-line placed  12/22 - off CRRT 12/23 - HD trial 1/6 - CRRT to be resumed due to pressor requirements with HD 1/9 - CRRT continues  Pt resting in bed at the time of assessment. No major clinical changes since last assessment. Continues to requires CRRT and duel pressor support. Pt continues to refuse to eat, but RN reports that he was able to get him to drink a Nepro this AM. Discussed liberalizing diet and that pt can eat as he pleases since intake is so poor.   Currently, PMT is working with family on GOC. They would like a few more days to monitor for  improvement but seem to be leaning more towards allowing for a less aggressive, comfort focused approach. Pt's intake has be minimal this week, if pt is not shifted to comfort, will need to have cortrak placed on Monday as he has elevated nutrition needs with his ongoing CRRT.    Meal completions: 12/21: 10% x1 documented offering 12/22: 75-100% x2 documented offerings 12/23: 100% x2 documented meals 12/24-12/29: 95% average intake x 8 recorded meals 12/30-1/6: 45% average intake x 5 recorded meals    Admit Weight: 104.3 kg Current Weight: 117.3 kg EDW: 102 kg   Dialysis Orders: MWF [facility] 4h  B400  101.9kg  2K bath  TDC  Hep 6000 - Hectoral 5mcg IV q HD - No ESA/iron     Medications:  Scheduled Meds:  feeding supplement  237 mL Oral BID BM   insulin  aspart  0-15 Units Subcutaneous TID WC   mirtazapine   7.5 mg Oral QHS   multivitamin  1 tablet Oral QHS   pantoprazole   40 mg Oral Daily   sevelamer  carbonate  800 mg Oral TID WC   Continuous Infusions:  norepinephrine  (LEVOPHED ) Adult infusion 5 mcg/min (11/25/24 0800)   vancomycin  1,500 mg (11/25/24 0822)   vasopressin  0.03 Units/min (11/25/24 0800)   PRN Meds: phenol, polyethylene glycol  Labs:  Sodium 133, chloride 97 Creatinine 2.44 CBG ranges from 95-132 mg/dL over the last 24 hours HgbA1c 8.6% (12/8)  NUTRITION - FOCUSED PHYSICAL EXAM: No significant muscle or fat depletions on exam. Notably, could  be skewed by body habitus and excessive adipose tissue as well as continued volume overload. Will benefit from follow up NFPE when edema improved.  Flowsheet Row Most Recent Value  Orbital Region No depletion  Upper Arm Region No depletion  Thoracic and Lumbar Region No depletion  Buccal Region No depletion  Temple Region No depletion  Clavicle Bone Region No depletion  Clavicle and Acromion Bone Region No depletion  Scapular Bone Region Unable to assess  Dorsal Hand No depletion  Patellar Region Unable to assess   [edema]  Anterior Thigh Region Unable to assess  [edema]  Posterior Calf Region Unable to assess  [edema,  R BKA]  Edema (RD Assessment) Moderate  Hair Reviewed  Eyes Reviewed  Mouth Reviewed  Skin Reviewed  Nails Reviewed     Diet Order:   Diet Orders (From admission, onward)     Start     Ordered   11/24/24 1147  Diet regular Room service appropriate? Yes with Assist; Fluid consistency: Thin; Fluid restriction: 1200 mL Fluid  Diet effective now       Question Answer Comment  Room service appropriate? Yes with Assist   Fluid consistency: Thin   Fluid restriction: 1200 mL Fluid      11/24/24 1146            EDUCATION NEEDS:  No education needs have been identified at this time  Skin:  Skin Assessment: Skin Integrity Issues: Skin Integrity Issues:: Stage II Stage II: coccyx  Last BM:  1/9 - type 6  Height:  Ht Readings from Last 1 Encounters:  10/24/24 6' 1 (1.854 m)   Weight:  Wt Readings from Last 1 Encounters:  11/24/24 117.3 kg   Ideal Body Weight:  78.6 kg (adjusted for R BKA)  BMI:  Body mass index is 34.12 kg/m.  Estimated Nutritional Needs:  Kcal:  2100-2300 kcals Protein:  105-120g Fluid:  1L + UOP    Vernell Lukes, RD, LDN, CNSC Registered Dietitian II Please reach out via secure chat

## 2024-11-26 DIAGNOSIS — I132 Hypertensive heart and chronic kidney disease with heart failure and with stage 5 chronic kidney disease, or end stage renal disease: Secondary | ICD-10-CM | POA: Diagnosis not present

## 2024-11-26 DIAGNOSIS — I5022 Chronic systolic (congestive) heart failure: Secondary | ICD-10-CM | POA: Diagnosis not present

## 2024-11-26 DIAGNOSIS — I9589 Other hypotension: Secondary | ICD-10-CM | POA: Diagnosis not present

## 2024-11-26 DIAGNOSIS — I4891 Unspecified atrial fibrillation: Secondary | ICD-10-CM | POA: Diagnosis not present

## 2024-11-26 DIAGNOSIS — E1122 Type 2 diabetes mellitus with diabetic chronic kidney disease: Secondary | ICD-10-CM | POA: Diagnosis not present

## 2024-11-26 DIAGNOSIS — N186 End stage renal disease: Secondary | ICD-10-CM | POA: Diagnosis not present

## 2024-11-26 DIAGNOSIS — I350 Nonrheumatic aortic (valve) stenosis: Secondary | ICD-10-CM | POA: Diagnosis not present

## 2024-11-26 LAB — CBC
HCT: 29.2 % — ABNORMAL LOW (ref 39.0–52.0)
Hemoglobin: 9.5 g/dL — ABNORMAL LOW (ref 13.0–17.0)
MCH: 26.8 pg (ref 26.0–34.0)
MCHC: 32.5 g/dL (ref 30.0–36.0)
MCV: 82.3 fL (ref 80.0–100.0)
Platelets: 130 K/uL — ABNORMAL LOW (ref 150–400)
RBC: 3.55 MIL/uL — ABNORMAL LOW (ref 4.22–5.81)
RDW: 26.8 % — ABNORMAL HIGH (ref 11.5–15.5)
WBC: 5.7 K/uL (ref 4.0–10.5)
nRBC: 1.4 % — ABNORMAL HIGH (ref 0.0–0.2)

## 2024-11-26 LAB — CULTURE, BLOOD (ROUTINE X 2)
Culture: NO GROWTH
Culture: NO GROWTH

## 2024-11-26 LAB — RENAL FUNCTION PANEL
Albumin: 3.3 g/dL — ABNORMAL LOW (ref 3.5–5.0)
Albumin: 3.2 g/dL — ABNORMAL LOW (ref 3.5–5.0)
Anion gap: 16 — ABNORMAL HIGH (ref 5–15)
Anion gap: 13 (ref 5–15)
BUN: 11 mg/dL (ref 8–23)
BUN: 10 mg/dL (ref 8–23)
CO2: 18 mmol/L — ABNORMAL LOW (ref 22–32)
CO2: 20 mmol/L — ABNORMAL LOW (ref 22–32)
Calcium: 8.3 mg/dL — ABNORMAL LOW (ref 8.9–10.3)
Calcium: 8 mg/dL — ABNORMAL LOW (ref 8.9–10.3)
Chloride: 99 mmol/L (ref 98–111)
Chloride: 102 mmol/L (ref 98–111)
Creatinine, Ser: 2.15 mg/dL — ABNORMAL HIGH (ref 0.61–1.24)
Creatinine, Ser: 2.01 mg/dL — ABNORMAL HIGH (ref 0.61–1.24)
GFR, Estimated: 34 mL/min — ABNORMAL LOW
GFR, Estimated: 37 mL/min — ABNORMAL LOW
Glucose, Bld: 197 mg/dL — ABNORMAL HIGH (ref 70–99)
Glucose, Bld: 149 mg/dL — ABNORMAL HIGH (ref 70–99)
Phosphorus: 2.6 mg/dL (ref 2.5–4.6)
Phosphorus: 2.5 mg/dL (ref 2.5–4.6)
Potassium: 4.1 mmol/L (ref 3.5–5.1)
Potassium: 3.7 mmol/L (ref 3.5–5.1)
Sodium: 134 mmol/L — ABNORMAL LOW (ref 135–145)
Sodium: 135 mmol/L (ref 135–145)

## 2024-11-26 LAB — HEPATIC FUNCTION PANEL
ALT: 93 U/L — ABNORMAL HIGH (ref 0–44)
AST: 136 U/L — ABNORMAL HIGH (ref 15–41)
Albumin: 3.3 g/dL — ABNORMAL LOW (ref 3.5–5.0)
Alkaline Phosphatase: 134 U/L — ABNORMAL HIGH (ref 38–126)
Bilirubin, Direct: 2.2 mg/dL — ABNORMAL HIGH (ref 0.0–0.2)
Indirect Bilirubin: 1 mg/dL — ABNORMAL HIGH (ref 0.3–0.9)
Total Bilirubin: 3.2 mg/dL — ABNORMAL HIGH (ref 0.0–1.2)
Total Protein: 8 g/dL (ref 6.5–8.1)

## 2024-11-26 LAB — MAGNESIUM: Magnesium: 2.5 mg/dL — ABNORMAL HIGH (ref 1.7–2.4)

## 2024-11-26 LAB — GLUCOSE, CAPILLARY
Glucose-Capillary: 172 mg/dL — ABNORMAL HIGH (ref 70–99)
Glucose-Capillary: 154 mg/dL — ABNORMAL HIGH (ref 70–99)
Glucose-Capillary: 113 mg/dL — ABNORMAL HIGH (ref 70–99)
Glucose-Capillary: 155 mg/dL — ABNORMAL HIGH (ref 70–99)

## 2024-11-26 LAB — VANCOMYCIN, RANDOM: Vancomycin Rm: 26 ug/mL

## 2024-11-26 MED ORDER — VANCOMYCIN HCL 1250 MG/250ML IV SOLN
1250.0000 mg | INTRAVENOUS | Status: DC
  Administered 2024-11-27 – 2024-11-28 (×2): 1250 mg via INTRAVENOUS
  Filled 2024-11-26 (×3): qty 250

## 2024-11-26 MED ORDER — VANCOMYCIN HCL 1250 MG/250ML IV SOLN: 1250.0000 mg | INTRAVENOUS | Status: DC

## 2024-11-26 MED ORDER — K PHOS MONO-SOD PHOS DI & MONO 155-852-130 MG PO TABS
500.0000 mg | ORAL_TABLET | ORAL | Status: AC
  Administered 2024-11-26 (×4): 500 mg via ORAL
  Filled 2024-11-26 (×4): qty 2

## 2024-11-26 MED ORDER — VANCOMYCIN HCL 1500 MG/300ML IV SOLN: 1500.0000 mg | INTRAVENOUS | Status: DC

## 2024-11-26 NOTE — Plan of Care (Signed)
" °  Problem: Education: Goal: Knowledge of General Education information will improve Description: Including pain rating scale, medication(s)/side effects and non-pharmacologic comfort measures Outcome: Progressing   Problem: Health Behavior/Discharge Planning: Goal: Ability to manage health-related needs will improve Outcome: Progressing   Problem: Clinical Measurements: Goal: Ability to maintain clinical measurements within normal limits will improve Outcome: Progressing Goal: Will remain free from infection Outcome: Progressing Goal: Diagnostic test results will improve Outcome: Progressing Goal: Respiratory complications will improve Outcome: Progressing Goal: Cardiovascular complication will be avoided Outcome: Progressing   Problem: Activity: Goal: Risk for activity intolerance will decrease Outcome: Progressing   Problem: Nutrition: Goal: Adequate nutrition will be maintained Outcome: Progressing   Problem: Elimination: Goal: Will not experience complications related to bowel motility Outcome: Progressing   Problem: Pain Managment: Goal: General experience of comfort will improve and/or be controlled Outcome: Progressing   Problem: Safety: Goal: Ability to remain free from injury will improve Outcome: Progressing   Problem: Skin Integrity: Goal: Risk for impaired skin integrity will decrease Outcome: Progressing   Problem: Respiratory: Goal: Ability to maintain adequate ventilation will improve Outcome: Progressing Goal: Ability to maintain a clear airway will improve Outcome: Progressing   Problem: Fluid Volume: Goal: Ability to maintain a balanced intake and output will improve Outcome: Progressing   Problem: Metabolic: Goal: Ability to maintain appropriate glucose levels will improve Outcome: Progressing   Problem: Tissue Perfusion: Goal: Adequacy of tissue perfusion will improve Outcome: Progressing   Problem: Cardiac: Goal: Ability to achieve  and maintain adequate cardiopulmonary perfusion will improve Outcome: Progressing   "

## 2024-11-26 NOTE — Progress Notes (Addendum)
 "  NAME:  [name] [name]., MRN:  [id number], DOB:  [birth year], LOS: 33 ADMISSION DATE:  10/24/2024, CONSULTATION DATE:  12/17 REFERRING MD:  Ricky - [facility], CHIEF COMPLAINT:  hypotension  History of Present Illness:  [name] STAUFFER Sr. is a 65 y.o. male who has a PMH as outlined below including but not limited to ESRD on HD MWF, A-fib, systolic CHF, hypertension, LBBB, DM.  He presented to [facility] ED on 10/24/2024 with dyspnea x roughly 2-3 weeks. He had been seen in the ED a few days early on 12/2 for dysuria and was started on a course of Cipro  which she started the following day and stated that he had been taking as prescribed.     CT chest that demonstrated multifocal pneumonia and small effusions.  Blood cultures ultimately grew MRSA.  He had been on vancomycin  which was continued. He had a left thora 12/10 with 450cc fluid removed.  His HD catheter was removed on 10/28/2024 as well as an arterial line.  He did have a TTE on 12/11 that showed an EF of 20-25%, G2 DD, mild decrease in RV SF, severe AAS, no vegetations.  TEE was recommended but had been deferred due to patient requiring vasopressor support.  ID was consulted and recommended line holiday.  He had a temporary femoral HD cath placed on 12/15.  ID recommendations included 6 weeks of vancomycin  to end on 12/09/2024.    12/17, he was transferred to [facility] for ongoing management for septic shock as well as for IR evaluation for tunneled HD catheter placement  Pertinent  Medical History  ESRD on HD MWF, Afib, HFrEF, T2DM, LBBB, severe PVD s/p R BKA Past Medical History:  Diagnosis Date   ESRD on hemodialysis (HCC)    a. HD MWF > 10 years.   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 01/2022 Echo: EF 45-50%, glob HK, mod LVH, mildly reduced RV fxn, mild MR, mod-sev MV Ca2+. Mild AS; b. 10/2023 Echo: EF 45-50%, glob HK, GrII DD, mildly reduced RV fxn, RVSP 52.81mmHg. Mild MR/MS, mod TR.   Hypertension    Nonischemic  cardiomyopathy (HCC)    a. 2019 Cath [facility]): Nonobstructive disease; b. 12/2019 Echo: EF 50%; c. 12/2019 MV (Tx w/u [facility]): Ant infarct w/ peri-infarct ischemia. EF 35%; c. 05/2021 MV (Tx w/u [facility]): Cor Ca2+, EF 53%, motion artifact, equiv study, likely low risk.   Nonobstructive coronary artery disease    a.  2019 Cath ([place] [facility]): Left main normal, LAD 30 proximal, RCA 30 distal, EF 25%.   Obesity    Peripheral arterial disease    Peripheral vascular disease    Type 2 diabetes mellitus (HCC)    a. 10/2023 - admission w/ HHS.   Significant Hospital Events: Including procedures, antibiotic start and stop dates in addition to other pertinent events   12/8 admit 12/10 L thora with 450cc removed, transudate, neg cultures 12/11 echo 12/12 HD cath and art line removed 12/15 new femoral HD cath placed 12/17 transfer to [facility] 4M 12/19 HD  12/20 CRRT 12/23 Weaned off levophed  this am. HD trial this am. Transferred to [facility] 12/25 Returned to ICU for hypotension 12/28-remains on Levophed  12/29-remains on Levophed , tolerating dialysis 12/31 was off Levophed  this morning 1/2 on Levophed  and had HD 1/4 changed to neo and vaso, stopped levophed  1/5 HD 1/6 CRRT and GOC discussion w/ pt's daughter   Interim History / Subjective:   On  CRRT Levophed  3 No complaints this am  Objective    Blood pressure (!) 78/52, pulse 86, temperature 98.5 F (36.9 C), temperature source Oral, resp. rate 20, height 6' 1 (1.854 m), weight 114.4 kg, SpO2 98%.        Intake/Output Summary (Last 24 hours) at 11/26/2024 0938 Last data filed at 11/26/2024 0900 Gross per 24 hour  Intake 770.73 ml  Output 2920 ml  Net -2149.27 ml   Filed Weights   11/23/24 0500 11/24/24 0500 11/26/24 0449  Weight: 119.2 kg 117.3 kg 114.4 kg   Physical Exam: General: Well-appearing, no acute distress HENT: [place], AT, OP clear, MMM Eyes: EOMI, no scleral icterus Respiratory: Clear to auscultation bilaterally.  No  crackles, wheezing or rales Cardiovascular: RRR, -M/R/G, no JVD GI: BS+, soft, nontender Extremities: S/p R BKA, 1-2+ pitting edema in LLE Neuro: AAO x4, CNII-XII grossly intact Skin: Intact, no rashes or bruising Psych: Normal mood, normal affect GU: Foley in place   Imaging, labs and test in EMR in the last 24 hours reviewed independently by me. Pertinent findings below: Na 134 - stable COD 18 improving AP 134 ucnahged T bili 3.2 Improved AS/ALT Normal WBC Stable Hg 9.5   Resolved problem list  Septic shock Multifocal pneumonia - s/p antibiotics (zosyn  x 4d and additional course of ceftriaxone  x 5 days) Acute hypoxemic respiratory failure, resolved  Assessment and Plan   Chronic hypotension requiring vasopressor support No evidence of worsening infection driving hypotension. Not in heart failure. Suspect related to poor cardiac function in setting of renal disease - On midodrine  20 mg TID, Strattera  20 mg BID. Patient aware that midodrine  can affect heart function - Has femoral a-line  - Restarted on CRRT on 11/22/24 with vasopressor support - Wean levophed  and vasopressin  for MAP goal >60  MRSA bacteremia - Continue Vanc end date 12/09/2024 - Has temporary dialysis catheter in place - Unable to complete TEE at this time - 12/12 Bcx negative, 12/18 Bcx negative - 1/5 Bcx NGTD  Elevated LFTs Elevated Bili Slowly improving with stable bili. Denies abd pain and NT on abd exam. Afebrile. UQ US  w/ single mobile gallstone.  -Trend enzymes  Afib w/ RVR - Currently rate- controlled  Heart failure with reduced ejection fraction of 20 to 25% Severe aortic stenosis - Not on GDMT due to hypotension  - Follows w/ outpatient cardiology, medical management - Volume removal per Nephrology  End-stage renal disease on hemodialysis Hypervolemic with 3rd spacing - Nephrology following. On CRRT due to hypotension - Has temp HD catheter, had plans for [facility] but limited due to pressor  needs  Type 2 diabetes CBG goal 140-180. Poor appetite so insulin  d/c.   Peripheral vascular disease - On home aspirin   Depressed mood Poor appetite States no symptoms of abd pain, n/v causing decreased appetite. Tearful and flat affect.  -mirtazapine  at bedtime   GOC Palliative following. Discussion w/ pt and pt's daughter who is POA. PMT and CCM expressed concern to both how pt may not tolerate HD outpatient. See 1/6 IPAL note.  -Patient states will follow daughter's decision.  -He is aware that if he is unable to tolerate HD then he will die.  -Daughter is agreeable for comfort focused care if remains in current condition or worsening, would like approx 5 days since GOC meeting 1/6.  -Palliative will readdress GOC when family ready tomorrow or Monday   The patient is critically ill with multiple organ systems failure and requires high complexity decision  making for assessment and support, frequent evaluation and titration of therapies, application of advanced monitoring technologies and extensive interpretation of multiple databases.  Independent Critical Care Time: 30 Minutes.   Slater Staff, M.D. [facility] Pulmonary/Critical Care Medicine 11/26/2024 9:38 AM   Please see Amion for pager number to reach on-call Pulmonary and Critical Care Team.    "

## 2024-11-26 NOTE — Progress Notes (Addendum)
 Pharmacy Antibiotic Note  [name] Deriso. is a 64 y.o. male admitted on 10/24/2024 with MRSA bacteremia.  Pharmacy has been consulted for vancomycin  dosing. Patient transitioned from [facility] to CRRT on 1/6 and continues on CRRT. Vancomycin  random this morning supratheraprutic at 26 drawn 5 hours before next dose due.   Plan: Decrease vancomycin  to 1250mg  q24h starting 1/11  Will draw vancomycin  random level 1/12 AM.   Height: 6' 1 (185.4 cm) Weight: 114.4 kg (252 lb 3.3 oz) IBW/kg (Calculated) : 79.9  Temp (24hrs), Avg:98.5 F (36.9 C), Min:98.2 F (36.8 C), Max:98.9 F (37.2 C)  Recent Labs  Lab 11/21/24 0351 11/21/24 1040 11/21/24 1624 11/21/24 1922 11/21/24 2222 11/22/24 0457 11/22/24 1041 11/22/24 1154 11/22/24 1632 11/23/24 0336 11/23/24 1613 11/24/24 0323 11/24/24 1520 11/25/24 0320 11/25/24 1510 11/26/24 0444  WBC 10.4  --   --   --   --   --   --   --   --  10.6*  --  9.2  --  8.0  --  5.7  CREATININE 5.61*  --   --   --   --    < >  --   --    < > 4.21*   < > 2.96* 2.53* 2.44* 2.34* 2.15*  LATICACIDVEN  --    < > 8.1* 7.3* 8.3*  --  6.3* 5.7*  --   --   --   --   --   --   --   --   VANCORANDOM  --   --   --   --   --   --   --   --   --  19  --   --   --   --   --  26   < > = values in this interval not displayed.    Estimated Creatinine Clearance: 47.2 mL/min (A) (by C-G formula based on SCr of 2.15 mg/dL (H)).    Allergies[1]  Thank you for allowing pharmacy to be a part of this patients care.  [name] [name], Pharm 11/26/2024 2:38 PM      [1]  Allergies Allergen Reactions   Sulfa Antibiotics Diarrhea and Nausea And Vomiting

## 2024-11-26 NOTE — Progress Notes (Signed)
 Pharmacy Electrolyte Replacement  Recent Labs:  Recent Labs    11/26/24 0444  K 4.1  MG 2.5*  PHOS 2.6  CREATININE 2.15*    Low Critical Values (K </= 2.5, Phos </= 1, Mg </= 1) Present: None  Plan: Per CRRT Phos Pharmacist replacement protocol:   Give K phos  neutral 500mg  q4h x 4 doses.   Leah Thornberry, PharmD

## 2024-11-26 NOTE — Progress Notes (Signed)
 [facility] NEPHROLOGY PROGRESS NOTE   Subjective:   Seen in room Net neg 2.2L , wt's down 114kg Remains on 3-5 micrograms/min levo gtt + vaso at 0.03   Presentation summary: 64 year old man with past medical history significant for hypertension, type 2 diabetes mellitus, status post right below-knee amputation, nonobstructive coronary artery disease, congestive heart failure with reduced ejection fraction, obesity and end-stage renal disease on hemodialysis.  Admitted to the hospital yesterday with SOB. CT scan of the chest showed multifocal bronchopneumonia with small right and moderate loculated left pleural effusion.  He was started on CAP coverage with vancomycin  and cefepime .  Overnight noted to be increasingly hypotensive prompting need to initiate Levophed .  He also has positive blood cultures (4 out of 4) for gram-positive cocci, which indwelling TDC.    Objective Vital signs in last 24 hours: Vitals:   11/19/24 1215 11/19/24 1230 11/19/24 1245 11/19/24 1255  BP: (!) 85/44 (!) 93/45 (!) 90/50 (!) 89/53  Pulse: 89 90 91 90  Resp: (!) 23 20 (!) 26 (!) 25  Temp:      TempSrc:      SpO2: 96% 97% 97% 97%  Weight:      Height:        Dialysis Orders: MWF [facility] 4h  B400  101.9kg  2K bath  TDC  Hep 6000 - Hectoral IV q HD - No ESA/iron   Physical Exam:        Gen responding, short responses to questions Lungs clear to auscultation  Heart S1S2 no rub Abdomen soft nontender obese habitus Ext diffuse 1-2+ edema x 4 ext, R bka Neuro more alert  Access groin temp HD catheter in place    Assessment/ Plan:  # ESRD:  - s/p CRRT 2/20-12/22, then was back on iHD - usual HD is MWF  - severe shock returned on 1/06 and CRRT was re-started - cont CRRT    # Septic shock - improved initially from MRSA bacteremia and improved - became severely hypotensive again  - on 2 pressors today  # MRSA bacteremia - initial sepsis episode - TDC removed, temp cath  replaced after line holiday - bcx's f/u cx's negative - cont IV abx per pmd  # Hypervolemia - LE/ UE edema w/ some improvement  - cont UF 65- 125 cc/hr w/ CRRT   # Anemia of ESRD:    - s/p PRBC - iron  saturation 8%, gave IV ferric gluconate 250mg  x 2  - esa stopped due to Hb > 10-11 - follow, may or may not resume  # GOC - palliative is following - per chart, family is considering transition to comfort care   [name]  MD  [facility] 11/26/2024, 12:23 PM  Recent Labs  Lab 11/25/24 0320 11/25/24 1510 11/26/24 0444  HGB 9.6*  --  9.5*  ALBUMIN  3.3*  3.2* 3.2* 3.3*  3.3*  CALCIUM  8.4* 8.5* 8.3*  PHOS 2.6 2.5 2.6  CREATININE 2.44* 2.34* 2.15*  K 4.2 4.1 4.1    Inpatient medications:  aspirin  EC  81 mg Oral Q breakfast   atomoxetine   20 mg Oral BID   Chlorhexidine  Gluconate Cloth  6 each Topical Daily   feeding supplement  237 mL Oral BID BM   guaiFENesin   600 mg Oral BID   heparin   5,000 Units Subcutaneous Q8H   insulin  aspart  0-15 Units Subcutaneous TID WC   midodrine   20 mg Oral Q8H   mirtazapine   7.5 mg Oral QHS  multivitamin  1 tablet Oral QHS   mupirocin  ointment  1 Application Nasal BID   pantoprazole   40 mg Oral Daily   phosphorus  500 mg Oral Q4H   sevelamer  carbonate  800 mg Oral TID WC    anticoagulant sodium citrate      anticoagulant sodium citrate      norepinephrine  (LEVOPHED ) Adult infusion 3 mcg/min (11/26/24 1200)   prismasol  BGK 4/2.5 1,500 mL/hr at 11/26/24 1221   prismasol  BGK 4/2.5 400 mL/hr at 11/26/24 1107   prismasol  BGK 4/2.5 400 mL/hr at 11/26/24 0442   [START ON 11/27/2024] vancomycin      vasopressin  0.03 Units/min (11/26/24 1200)   acetaminophen  **OR** acetaminophen , alteplase , anticoagulant sodium citrate , anticoagulant sodium citrate , guaiFENesin -dextromethorphan , heparin , heparin , heparin , hydrOXYzine , ipratropium-albuterol , lidocaine  (PF), lidocaine  (PF), lidocaine -prilocaine , mouth rinse, pentafluoroprop-tetrafluoroeth,  pentafluoroprop-tetrafluoroeth, phenol, polyethylene glycol, trimethobenzamide 

## 2024-11-27 DIAGNOSIS — I9589 Other hypotension: Secondary | ICD-10-CM | POA: Diagnosis not present

## 2024-11-27 DIAGNOSIS — I5022 Chronic systolic (congestive) heart failure: Secondary | ICD-10-CM | POA: Diagnosis not present

## 2024-11-27 DIAGNOSIS — I350 Nonrheumatic aortic (valve) stenosis: Secondary | ICD-10-CM | POA: Diagnosis not present

## 2024-11-27 DIAGNOSIS — I4891 Unspecified atrial fibrillation: Secondary | ICD-10-CM | POA: Diagnosis not present

## 2024-11-27 DIAGNOSIS — I132 Hypertensive heart and chronic kidney disease with heart failure and with stage 5 chronic kidney disease, or end stage renal disease: Secondary | ICD-10-CM | POA: Diagnosis not present

## 2024-11-27 DIAGNOSIS — N186 End stage renal disease: Secondary | ICD-10-CM | POA: Diagnosis not present

## 2024-11-27 DIAGNOSIS — E1122 Type 2 diabetes mellitus with diabetic chronic kidney disease: Secondary | ICD-10-CM | POA: Diagnosis not present

## 2024-11-27 LAB — RENAL FUNCTION PANEL
Albumin: 3.1 g/dL — ABNORMAL LOW (ref 3.5–5.0)
Albumin: 2.4 g/dL — ABNORMAL LOW (ref 3.5–5.0)
Anion gap: 15 (ref 5–15)
Anion gap: 11 (ref 5–15)
BUN: 9 mg/dL (ref 8–23)
BUN: 7 mg/dL — ABNORMAL LOW (ref 8–23)
CO2: 19 mmol/L — ABNORMAL LOW (ref 22–32)
CO2: 15 mmol/L — ABNORMAL LOW (ref 22–32)
Calcium: 8 mg/dL — ABNORMAL LOW (ref 8.9–10.3)
Calcium: 6.1 mg/dL — CL (ref 8.9–10.3)
Chloride: 101 mmol/L (ref 98–111)
Chloride: 112 mmol/L — ABNORMAL HIGH (ref 98–111)
Creatinine, Ser: 1.89 mg/dL — ABNORMAL HIGH (ref 0.61–1.24)
Creatinine, Ser: 1.45 mg/dL — ABNORMAL HIGH (ref 0.61–1.24)
GFR, Estimated: 40 mL/min — ABNORMAL LOW
GFR, Estimated: 54 mL/min — ABNORMAL LOW
Glucose, Bld: 149 mg/dL — ABNORMAL HIGH (ref 70–99)
Glucose, Bld: 167 mg/dL — ABNORMAL HIGH (ref 70–99)
Phosphorus: 2.5 mg/dL (ref 2.5–4.6)
Phosphorus: 1.5 mg/dL — ABNORMAL LOW (ref 2.5–4.6)
Potassium: 3.7 mmol/L (ref 3.5–5.1)
Potassium: 3.1 mmol/L — ABNORMAL LOW (ref 3.5–5.1)
Sodium: 134 mmol/L — ABNORMAL LOW (ref 135–145)
Sodium: 138 mmol/L (ref 135–145)

## 2024-11-27 LAB — CBC
HCT: 28 % — ABNORMAL LOW (ref 39.0–52.0)
Hemoglobin: 9.3 g/dL — ABNORMAL LOW (ref 13.0–17.0)
MCH: 27.6 pg (ref 26.0–34.0)
MCHC: 33.2 g/dL (ref 30.0–36.0)
MCV: 83.1 fL (ref 80.0–100.0)
Platelets: 115 K/uL — ABNORMAL LOW (ref 150–400)
RBC: 3.37 MIL/uL — ABNORMAL LOW (ref 4.22–5.81)
RDW: 27.1 % — ABNORMAL HIGH (ref 11.5–15.5)
WBC: 5.6 K/uL (ref 4.0–10.5)
nRBC: 1.4 % — ABNORMAL HIGH (ref 0.0–0.2)

## 2024-11-27 LAB — GLUCOSE, CAPILLARY
Glucose-Capillary: 132 mg/dL — ABNORMAL HIGH (ref 70–99)
Glucose-Capillary: 180 mg/dL — ABNORMAL HIGH (ref 70–99)
Glucose-Capillary: 195 mg/dL — ABNORMAL HIGH (ref 70–99)
Glucose-Capillary: 203 mg/dL — ABNORMAL HIGH (ref 70–99)

## 2024-11-27 LAB — MAGNESIUM: Magnesium: 2.4 mg/dL (ref 1.7–2.4)

## 2024-11-27 MED ORDER — POTASSIUM CHLORIDE CRYS ER 20 MEQ PO TBCR
40.0000 meq | EXTENDED_RELEASE_TABLET | Freq: Once | ORAL | Status: AC
  Administered 2024-11-27: 40 meq via ORAL
  Filled 2024-11-27: qty 2

## 2024-11-27 MED ORDER — CALCIUM GLUCONATE-NACL 2-0.675 GM/100ML-% IV SOLN
2.0000 g | Freq: Once | INTRAVENOUS | Status: AC
  Administered 2024-11-27: 2000 mg via INTRAVENOUS
  Filled 2024-11-27: qty 100

## 2024-11-27 MED ORDER — SODIUM CHLORIDE 0.9 % IV SOLN
500.0000 [IU]/h | INTRAVENOUS | Status: DC
  Administered 2024-11-27 – 2024-11-28 (×2): 500 [IU]/h via INTRAVENOUS_CENTRAL
  Administered 2024-11-28 – 2024-11-29 (×3): 750 [IU]/h via INTRAVENOUS_CENTRAL
  Filled 2024-11-27 (×5): qty 10000

## 2024-11-27 NOTE — Progress Notes (Signed)
 "  NAME:  [name] [name]., MRN:  [id number], DOB:  [birth year], LOS: 34 ADMISSION DATE:  10/24/2024, CONSULTATION DATE:  12/17 REFERRING MD:  Ricky - [facility], CHIEF COMPLAINT:  hypotension  History of Present Illness:  [name] LAUGHTER Sr. is a 64 y.o. male who has a PMH as outlined below including but not limited to ESRD on HD MWF, A-fib, systolic CHF, hypertension, LBBB, DM.  He presented to [facility] ED on 10/24/2024 with dyspnea x roughly 2-3 weeks. He had been seen in the ED a few days early on 12/2 for dysuria and was started on a course of Cipro  which she started the following day and stated that he had been taking as prescribed.     CT chest that demonstrated multifocal pneumonia and small effusions.  Blood cultures ultimately grew MRSA.  He had been on vancomycin  which was continued. He had a left thora 12/10 with 450cc fluid removed.  His HD catheter was removed on 10/28/2024 as well as an arterial line.  He did have a TTE on 12/11 that showed an EF of 20-25%, G2 DD, mild decrease in RV SF, severe AAS, no vegetations.  TEE was recommended but had been deferred due to patient requiring vasopressor support.  ID was consulted and recommended line holiday.  He had a temporary femoral HD cath placed on 12/15.  ID recommendations included 6 weeks of vancomycin  to end on 12/09/2024.    12/17, he was transferred to [facility] for ongoing management for septic shock as well as for IR evaluation for tunneled HD catheter placement  Pertinent  Medical History  ESRD on HD MWF, Afib, HFrEF, T2DM, LBBB, severe PVD s/p R BKA Past Medical History:  Diagnosis Date   ESRD on hemodialysis (HCC)    a. HD MWF > 10 years.   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 01/2022 Echo: EF 45-50%, glob HK, mod LVH, mildly reduced RV fxn, mild MR, mod-sev MV Ca2+. Mild AS; b. 10/2023 Echo: EF 45-50%, glob HK, GrII DD, mildly reduced RV fxn, RVSP 52.81mmHg. Mild MR/MS, mod TR.   Hypertension    Nonischemic  cardiomyopathy (HCC)    a. 2019 Cath [facility]): Nonobstructive disease; b. 12/2019 Echo: EF 50%; c. 12/2019 MV (Tx w/u [facility]): Ant infarct w/ peri-infarct ischemia. EF 35%; c. 05/2021 MV (Tx w/u [facility]): Cor Ca2+, EF 53%, motion artifact, equiv study, likely low risk.   Nonobstructive coronary artery disease    a.  2019 Cath ([facility]): Left main normal, LAD 30 proximal, RCA 30 distal, EF 25%.   Obesity    Peripheral arterial disease    Peripheral vascular disease    Type 2 diabetes mellitus (HCC)    a. 10/2023 - admission w/ HHS.   Significant Hospital Events: Including procedures, antibiotic start and stop dates in addition to other pertinent events   12/8 admit 12/10 L thora with 450cc removed, transudate, neg cultures 12/11 echo 12/12 HD cath and art line removed 12/15 new femoral HD cath placed 12/17 transfer to [facility] 73M 12/19 HD  12/20 CRRT 12/23 Weaned off levophed  this am. HD trial this am. Transferred to [facility] 12/25 Returned to ICU for hypotension 12/28-remains on Levophed  12/29-remains on Levophed , tolerating dialysis 12/31 was off Levophed  this morning 1/2 on Levophed  and had HD 1/4 changed to neo and vaso, stopped levophed  1/5 HD 1/6 CRRT and GOC discussion w/ pt's daughter   Interim History / Subjective:  CRRT clotted  x 3 overnight. Nephrology continuing CRRT Remains on 1-2 levophed   Objective    Blood pressure (!) 78/52, pulse 79, temperature 98.5 F (36.9 C), temperature source Axillary, resp. rate 18, height 6' 1 (1.854 m), weight 111.1 kg, SpO2 97%.        Intake/Output Summary (Last 24 hours) at 11/27/2024 1046 Last data filed at 11/27/2024 1003 Gross per 24 hour  Intake 818.08 ml  Output 2962 ml  Net -2143.92 ml   Filed Weights   11/24/24 0500 11/26/24 0449 11/27/24 0145  Weight: 117.3 kg 114.4 kg 111.1 kg  Physical Exam: General: Well-appearing, no acute distress HENT: [place], AT, OP clear, MMM Eyes: EOMI, no scleral icterus Respiratory:  Clear to auscultation bilaterally.  No crackles, wheezing or rales Cardiovascular: RRR, -M/R/G, no JVD GI: BS+, soft, nontender Extremities: S/p R BKA, 1-2+ pitting edema Neuro: AAO x4, CNII-XII grossly intact Psych: Depressed mood, normal affect GU: Foley in place  Imaging, labs and test in EMR in the last 24 hours reviewed independently by me. Pertinent findings below: Na 134 stable CO2 19 stable WBC 5.6 HG 9.3 stable  11/26/24 AP 134  T bili 3.2 Improved AST/ALT  Resolved problem list  Septic shock Multifocal pneumonia - s/p antibiotics (zosyn  x 4d and additional course of ceftriaxone  x 5 days) Acute hypoxemic respiratory failure, resolved  Assessment and Plan   Chronic hypotension requiring vasopressor support No evidence of worsening infection driving hypotension. Not in heart failure. Suspect related to poor cardiac function in setting of renal disease - On midodrine  20 mg TID, Strattera  20 mg BID. Patient aware that midodrine  can affect heart function - Has femoral a-line  - Restarted on CRRT on 11/22/24 with vasopressor support - Wean levophed  and vasopressin  for MAP goal >60. Unfortunately remains on low dose pressors without minimal change  MRSA bacteremia - Continue Vanc end date 12/09/2024 - Has temporary dialysis catheter in place - Unable to complete TEE at this time - 12/12 Bcx negative, 12/18 Bcx negative - 1/5 Bcx NGTD  Elevated LFTs Elevated Bili Slowly improving with stable bili. Denies abd pain and NT on abd exam. Afebrile. UQ US  w/ single mobile gallstone.  -Trend enzymes  Afib w/ RVR - Currently rate controled  Heart failure with reduced ejection fraction of 20 to 25% Severe aortic stenosis - Not on GDMT due to hypotension  - Follows w/ outpatient cardiology, medical management - Volume removal per Nephrology  End-stage renal disease on hemodialysis Hypervolemic with 3rd spacing - Nephrology following. On CRRT due to hypotension - Has temp HD  catheter, had plans for [facility] but limited due to pressor needs  Type 2 diabetes CBG goal 140-180. Poor appetite so insulin  d/c.   Peripheral vascular disease - On home aspirin   Depressed mood Poor appetite States no symptoms of abd pain, n/v causing decreased appetite. Tearful and flat affect.  -mirtazapine  at bedtime   GOC Palliative following. Discussion w/ pt and pt's daughter who is POA. PMT and CCM expressed concern to both how pt may not tolerate HD outpatient. See 1/6 IPAL note.  -Patient states will follow daughter's decision.  -He is aware that if he is unable to tolerate HD then he will die.  -Daughter is agreeable for comfort focused care if remains in current condition or worsening, would like approx 5 days since GOC meeting 1/6.  -Palliative following to readdress GOC when family ready tomorrow or Monday  The patient is critically ill with multiple organ systems failure and requires high  complexity decision making for assessment and support, frequent evaluation and titration of therapies, application of advanced monitoring technologies and extensive interpretation of multiple databases.  Independent Critical Care Time: 32 Minutes.   Slater Staff, M.D. [facility] Pulmonary/Critical Care Medicine 11/27/2024 10:46 AM   Please see Amion for pager number to reach on-call Pulmonary and Critical Care Team.    "

## 2024-11-27 NOTE — Plan of Care (Signed)
   Problem: Clinical Measurements: Goal: Respiratory complications will improve Outcome: Progressing Goal: Cardiovascular complication will be avoided Outcome: Progressing   Problem: Nutrition: Goal: Adequate nutrition will be maintained Outcome: Progressing

## 2024-11-27 NOTE — Progress Notes (Signed)
 [facility] NEPHROLOGY PROGRESS NOTE   Subjective:   Seen in room Net neg 2.1L yest Levo is at 1-2 micrograms/min vaso is at 0.03 Clotted 3 times yest --> placing orders for IV heparin  in the circuit    Presentation summary: 64 year old man with past medical history significant for hypertension, type 2 diabetes mellitus, status post right below-knee amputation, nonobstructive coronary artery disease, congestive heart failure with reduced ejection fraction, obesity and end-stage renal disease on hemodialysis.  Admitted to the hospital yesterday with SOB. CT scan of the chest showed multifocal bronchopneumonia with small right and moderate loculated left pleural effusion.  He was started on CAP coverage with vancomycin  and cefepime .  Overnight noted to be increasingly hypotensive prompting need to initiate Levophed .  He also has positive blood cultures (4 out of 4) for gram-positive cocci, which indwelling TDC.    Objective Vital signs in last 24 hours: Vitals:   11/19/24 1215 11/19/24 1230 11/19/24 1245 11/19/24 1255  BP: (!) 85/44 (!) 93/45 (!) 90/50 (!) 89/53  Pulse: 89 90 91 90  Resp: (!) 23 20 (!) 26 (!) 25  Temp:      TempSrc:      SpO2: 96% 97% 97% 97%  Weight:      Height:        Dialysis Orders: MWF [facility] 4h  B400  101.9kg  2K bath  TDC  Hep 6000 - Hectoral IV q HD - No ESA/iron   Physical Exam:        Gen responding, short responses to questions Lungs clear to auscultation  Heart S1S2 no rub Abdomen soft nontender obese habitus Ext diffuse 1-2+ edema x 4 ext, R bka Neuro more alert  Access groin temp HD catheter in place    Assessment/ Plan:  # ESRD:  - s/p CRRT 2/20-12/22, then was back on iHD - usual HD is MWF  - severe shock returned on 1/06 and CRRT was re-started - tolerating well, though clotted 3x yesterday - adding IV heparin  thru CRRT circuit - cont CRRT    # Septic shock - improved initially from MRSA bacteremia and  improved - became severely hypotensive again  - on 2 pressors today  # MRSA bacteremia - initial sepsis episode - TDC removed, temp cath replaced after line holiday - bcx's f/u cx's negative - cont IV abx per pmd  # Hypervolemia - LE/ UE edema w/ some improvement  - wt's are down  - cont UF 65- 125 cc/hr w/ CRRT   # Anemia of ESRD:    - s/p PRBC - iron  saturation 8%, gave IV ferric gluconate 250mg  x 2  - esa stopped due to Hb > 10-11 - follow, may or may not resume  # GOC - palliative is following - per recent charting, family is considering transition to comfort care   [name] Fret  MD  [facility] 11/27/2024, 9:46 AM  Recent Labs  Lab 11/26/24 0444 11/26/24 1706 11/27/24 0218  HGB 9.5*  --  9.3*  ALBUMIN  3.3*  3.3* 3.2* 3.1*  CALCIUM  8.3* 8.0* 8.0*  PHOS 2.6 2.5 2.5  CREATININE 2.15* 2.01* 1.89*  K 4.1 3.7 3.7    Inpatient medications:  aspirin  EC  81 mg Oral Q breakfast   atomoxetine   20 mg Oral BID   Chlorhexidine  Gluconate Cloth  6 each Topical Daily   feeding supplement  237 mL Oral BID BM   guaiFENesin   600 mg Oral BID   heparin   5,000 Units  Subcutaneous Q8H   insulin  aspart  0-15 Units Subcutaneous TID WC   midodrine   20 mg Oral Q8H   mirtazapine   7.5 mg Oral QHS   multivitamin  1 tablet Oral QHS   mupirocin  ointment  1 Application Nasal BID   pantoprazole   40 mg Oral Daily   sevelamer  carbonate  800 mg Oral TID WC    anticoagulant sodium citrate      anticoagulant sodium citrate      heparin  10,000 units/ 20 mL infusion syringe     norepinephrine  (LEVOPHED ) Adult infusion 1.5 mcg/min (11/27/24 0900)   prismasol  BGK 4/2.5 1,500 mL/hr at 11/27/24 0915   prismasol  BGK 4/2.5 400 mL/hr at 11/27/24 0031   prismasol  BGK 4/2.5 400 mL/hr at 11/27/24 0551   vancomycin      vasopressin  0.03 Units/min (11/27/24 0934)   acetaminophen  **OR** acetaminophen , alteplase , anticoagulant sodium citrate , anticoagulant sodium citrate , guaiFENesin -dextromethorphan , heparin ,  heparin , heparin , hydrOXYzine , ipratropium-albuterol , lidocaine  (PF), lidocaine  (PF), lidocaine -prilocaine , mouth rinse, pentafluoroprop-tetrafluoroeth, pentafluoroprop-tetrafluoroeth, phenol, polyethylene glycol, trimethobenzamide 

## 2024-11-27 NOTE — Plan of Care (Signed)
" °  Medical records reviewed including progress notes, labs, imaging and MAR.  Per Dr. Kassie progress note 11/27/2024, patient continues on vasopressor support (levophed  1-2 mcg), and CRRT. Low dose pressors with minimal change. Per nephrology note 11/27/2024, tolerating CRRT, weights down.   Goals of care are clear.  Patient is overall stable. Plan remains to remain full code with full scope of treatment.   PMT will continue to follow for acute needs.  Thank you for referral and allowing PMT to assist in  [name] [name] Sr.'s care.   [name] [name]. [name], AGNP-C Palliative Medicine Team Phone: [phone]  NO CHARGE     "

## 2024-11-28 DIAGNOSIS — I132 Hypertensive heart and chronic kidney disease with heart failure and with stage 5 chronic kidney disease, or end stage renal disease: Secondary | ICD-10-CM | POA: Diagnosis not present

## 2024-11-28 DIAGNOSIS — E1122 Type 2 diabetes mellitus with diabetic chronic kidney disease: Secondary | ICD-10-CM | POA: Diagnosis not present

## 2024-11-28 DIAGNOSIS — I5022 Chronic systolic (congestive) heart failure: Secondary | ICD-10-CM | POA: Diagnosis not present

## 2024-11-28 DIAGNOSIS — I9589 Other hypotension: Secondary | ICD-10-CM | POA: Diagnosis not present

## 2024-11-28 DIAGNOSIS — N186 End stage renal disease: Secondary | ICD-10-CM | POA: Diagnosis not present

## 2024-11-28 DIAGNOSIS — I4891 Unspecified atrial fibrillation: Secondary | ICD-10-CM | POA: Diagnosis not present

## 2024-11-28 DIAGNOSIS — I350 Nonrheumatic aortic (valve) stenosis: Secondary | ICD-10-CM | POA: Diagnosis not present

## 2024-11-28 LAB — MAGNESIUM
Magnesium: 2.4 mg/dL (ref 1.7–2.4)
Magnesium: 2.4 mg/dL (ref 1.7–2.4)

## 2024-11-28 LAB — CBC
HCT: 28.2 % — ABNORMAL LOW (ref 39.0–52.0)
Hemoglobin: 9.2 g/dL — ABNORMAL LOW (ref 13.0–17.0)
MCH: 27.4 pg (ref 26.0–34.0)
MCHC: 32.6 g/dL (ref 30.0–36.0)
MCV: 83.9 fL (ref 80.0–100.0)
Platelets: 110 K/uL — ABNORMAL LOW (ref 150–400)
RBC: 3.36 MIL/uL — ABNORMAL LOW (ref 4.22–5.81)
RDW: 27.4 % — ABNORMAL HIGH (ref 11.5–15.5)
WBC: 6 K/uL (ref 4.0–10.5)
nRBC: 1.3 % — ABNORMAL HIGH (ref 0.0–0.2)

## 2024-11-28 LAB — BASIC METABOLIC PANEL WITH GFR
Anion gap: 11 (ref 5–15)
BUN: 9 mg/dL (ref 8–23)
CO2: 22 mmol/L (ref 22–32)
Calcium: 8.1 mg/dL — ABNORMAL LOW (ref 8.9–10.3)
Chloride: 102 mmol/L (ref 98–111)
Creatinine, Ser: 1.88 mg/dL — ABNORMAL HIGH (ref 0.61–1.24)
GFR, Estimated: 40 mL/min — ABNORMAL LOW
Glucose, Bld: 253 mg/dL — ABNORMAL HIGH (ref 70–99)
Potassium: 4.3 mmol/L (ref 3.5–5.1)
Sodium: 135 mmol/L (ref 135–145)

## 2024-11-28 LAB — APTT: aPTT: 62 s — ABNORMAL HIGH (ref 24–36)

## 2024-11-28 LAB — POCT I-STAT 7, (LYTES, BLD GAS, ICA,H+H)
Acid-base deficit: 2 mmol/L (ref 0.0–2.0)
Bicarbonate: 22 mmol/L (ref 20.0–28.0)
Calcium, Ion: 1.1 mmol/L — ABNORMAL LOW (ref 1.15–1.40)
HCT: 35 % — ABNORMAL LOW (ref 39.0–52.0)
Hemoglobin: 11.9 g/dL — ABNORMAL LOW (ref 13.0–17.0)
O2 Saturation: 98 %
Patient temperature: 98.5
Potassium: 4.2 mmol/L (ref 3.5–5.1)
Sodium: 139 mmol/L (ref 135–145)
TCO2: 23 mmol/L (ref 22–32)
pCO2 arterial: 34.3 mmHg (ref 32–48)
pH, Arterial: 7.415 (ref 7.35–7.45)
pO2, Arterial: 104 mmHg (ref 83–108)

## 2024-11-28 LAB — RENAL FUNCTION PANEL
Albumin: 3.2 g/dL — ABNORMAL LOW (ref 3.5–5.0)
Albumin: 3.3 g/dL — ABNORMAL LOW (ref 3.5–5.0)
Anion gap: 12 (ref 5–15)
Anion gap: 12 (ref 5–15)
BUN: 8 mg/dL (ref 8–23)
BUN: 8 mg/dL (ref 8–23)
CO2: 23 mmol/L (ref 22–32)
CO2: 22 mmol/L (ref 22–32)
Calcium: 8.1 mg/dL — ABNORMAL LOW (ref 8.9–10.3)
Calcium: 8.2 mg/dL — ABNORMAL LOW (ref 8.9–10.3)
Chloride: 101 mmol/L (ref 98–111)
Chloride: 102 mmol/L (ref 98–111)
Creatinine, Ser: 1.91 mg/dL — ABNORMAL HIGH (ref 0.61–1.24)
Creatinine, Ser: 1.83 mg/dL — ABNORMAL HIGH (ref 0.61–1.24)
GFR, Estimated: 39 mL/min — ABNORMAL LOW
GFR, Estimated: 41 mL/min — ABNORMAL LOW
Glucose, Bld: 268 mg/dL — ABNORMAL HIGH (ref 70–99)
Glucose, Bld: 220 mg/dL — ABNORMAL HIGH (ref 70–99)
Phosphorus: 2.1 mg/dL — ABNORMAL LOW (ref 2.5–4.6)
Phosphorus: 2.4 mg/dL — ABNORMAL LOW (ref 2.5–4.6)
Potassium: 4.3 mmol/L (ref 3.5–5.1)
Potassium: 4.3 mmol/L (ref 3.5–5.1)
Sodium: 135 mmol/L (ref 135–145)
Sodium: 136 mmol/L (ref 135–145)

## 2024-11-28 LAB — GLUCOSE, CAPILLARY
Glucose-Capillary: 265 mg/dL — ABNORMAL HIGH (ref 70–99)
Glucose-Capillary: 243 mg/dL — ABNORMAL HIGH (ref 70–99)
Glucose-Capillary: 200 mg/dL — ABNORMAL HIGH (ref 70–99)
Glucose-Capillary: 186 mg/dL — ABNORMAL HIGH (ref 70–99)

## 2024-11-28 LAB — HEPATIC FUNCTION PANEL
ALT: 69 U/L — ABNORMAL HIGH (ref 0–44)
AST: 84 U/L — ABNORMAL HIGH (ref 15–41)
Albumin: 3.2 g/dL — ABNORMAL LOW (ref 3.5–5.0)
Alkaline Phosphatase: 138 U/L — ABNORMAL HIGH (ref 38–126)
Bilirubin, Direct: 2.3 mg/dL — ABNORMAL HIGH (ref 0.0–0.2)
Indirect Bilirubin: 1.3 mg/dL — ABNORMAL HIGH (ref 0.3–0.9)
Total Bilirubin: 3.5 mg/dL — ABNORMAL HIGH (ref 0.0–1.2)
Total Protein: 7.8 g/dL (ref 6.5–8.1)

## 2024-11-28 MED ORDER — SODIUM PHOSPHATES 45 MMOLE/15ML IV SOLN
15.0000 mmol | Freq: Once | INTRAVENOUS | Status: AC
  Administered 2024-11-28: 15 mmol via INTRAVENOUS
  Filled 2024-11-28: qty 5

## 2024-11-28 MED ORDER — POTASSIUM CHLORIDE CRYS ER 20 MEQ PO TBCR
40.0000 meq | EXTENDED_RELEASE_TABLET | ORAL | Status: AC
  Administered 2024-11-28 (×2): 40 meq via ORAL
  Filled 2024-11-28 (×2): qty 2

## 2024-11-28 NOTE — Inpatient Diabetes Management (Signed)
 Inpatient Diabetes Program Recommendations  AACE/ADA: New Consensus Statement on Inpatient Glycemic Control (2015)  Target Ranges:  Prepandial:   less than 140 mg/dL      Peak postprandial:   less than 180 mg/dL (1-2 hours)      Critically ill patients:  140 - 180 mg/dL   Lab Results  Component Value Date   GLUCAP 243 (H) 11/28/2024   HGBA1C 8.6 (H) 10/24/2024    Review of Glycemic Control  Latest Reference Range & Units 11/27/24 07:56 11/27/24 11:33 11/27/24 15:34 11/27/24 22:07 11/28/24 07:28 11/28/24 11:15  Glucose-Capillary 70 - 99 mg/dL 867 (H) 819 (H) 804 (H) 203 (H) 265 (H) 243 (H)  (H): Data is abnormally high  Diabetes history: DM2 Outpatient Diabetes medications: 70/30 30 units QAM, 25 QPM Current orders for Inpatient glycemic control: Novolog  0-15 units TID  Inpatient Diabetes Program Recommendations:    Appears glucose is trending up.  Might consider:  Semglee  12 units every day  Thank you, Wyvonna Pinal, MSN, CDCES Diabetes Coordinator Inpatient Diabetes Program [phone] (team pager from 8a-5p)

## 2024-11-28 NOTE — Progress Notes (Signed)
 "  NAME:  [name] [name]., MRN:  [id number], DOB:  [birth year], LOS: 35 ADMISSION DATE:  10/24/2024, CONSULTATION DATE:  12/17 REFERRING MD:  Ricky - [facility], CHIEF COMPLAINT:  hypotension  History of Present Illness:  [name] HEINTZELMAN Sr. is a 64 y.o. male who has a PMH as outlined below including but not limited to ESRD on HD MWF, A-fib, systolic CHF, hypertension, LBBB, DM.  He presented to [facility] ED on 10/24/2024 with dyspnea x roughly 2-3 weeks. He had been seen in the ED a few days early on 12/2 for dysuria and was started on a course of Cipro  which she started the following day and stated that he had been taking as prescribed.     CT chest that demonstrated multifocal pneumonia and small effusions.  Blood cultures ultimately grew MRSA.  He had been on vancomycin  which was continued. He had a left thora 12/10 with 450cc fluid removed.  His HD catheter was removed on 10/28/2024 as well as an arterial line.  He did have a TTE on 12/11 that showed an EF of 20-25%, G2 DD, mild decrease in RV SF, severe AAS, no vegetations.  TEE was recommended but had been deferred due to patient requiring vasopressor support.  ID was consulted and recommended line holiday.  He had a temporary femoral HD cath placed on 12/15.  ID recommendations included 6 weeks of vancomycin  to end on 12/09/2024.    12/17, he was transferred to [facility] for ongoing management for septic shock as well as for IR evaluation for tunneled HD catheter placement  Pertinent  Medical History  ESRD on HD MWF, Afib, HFrEF, T2DM, LBBB, severe PVD s/p R BKA Past Medical History:  Diagnosis Date   ESRD on hemodialysis (HCC)    a. HD MWF > 10 years.   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 01/2022 Echo: EF 45-50%, glob HK, mod LVH, mildly reduced RV fxn, mild MR, mod-sev MV Ca2+. Mild AS; b. 10/2023 Echo: EF 45-50%, glob HK, GrII DD, mildly reduced RV fxn, RVSP 52.4mmHg. Mild MR/MS, mod TR.   Hypertension    Nonischemic  cardiomyopathy (HCC)    a. 2019 Cath [facility]): Nonobstructive disease; b. 12/2019 Echo: EF 50%; c. 12/2019 MV (Tx w/u [facility]): Ant infarct w/ peri-infarct ischemia. EF 35%; c. 05/2021 MV (Tx w/u [facility]): Cor Ca2+, EF 53%, motion artifact, equiv study, likely low risk.   Nonobstructive coronary artery disease    a.  2019 Cath ([place] [facility]): Left main normal, LAD 30 proximal, RCA 30 distal, EF 25%.   Obesity    Peripheral arterial disease    Peripheral vascular disease    Type 2 diabetes mellitus (HCC)    a. 10/2023 - admission w/ HHS.   Significant Hospital Events: Including procedures, antibiotic start and stop dates in addition to other pertinent events   12/8 admit 12/10 L thora with 450cc removed, transudate, neg cultures 12/11 echo 12/12 HD cath and art line removed 12/15 new femoral HD cath placed 12/17 transfer to [facility] 44M 12/19 HD  12/20 CRRT 12/23 Weaned off levophed  this am. HD trial this am. Transferred to [facility] 12/25 Returned to ICU for hypotension 12/28-remains on Levophed  12/29-remains on Levophed , tolerating dialysis 12/31 was off Levophed  this morning 1/2 on Levophed  and had HD 1/4 changed to neo and vaso, stopped levophed  1/5 HD 1/6 CRRT and GOC discussion w/ pt's daughter   Interim History / Subjective:  Remains on  low dose vasopressor support On CRRT  Objective    Blood pressure (!) 78/52, pulse 89, temperature 98.9 F (37.2 C), temperature source Axillary, resp. rate 11, height 6' 1 (1.854 m), weight 110.9 kg, SpO2 100%.        Intake/Output Summary (Last 24 hours) at 11/28/2024 0817 Last data filed at 11/28/2024 0800 Gross per 24 hour  Intake 1558.12 ml  Output 3682.2 ml  Net -2124.08 ml   Filed Weights   11/26/24 0449 11/27/24 0145 11/28/24 0423  Weight: 114.4 kg 111.1 kg 110.9 kg   Physical Exam: General: Chronically ill-appearing, no acute distress HENT: [name] [name], AT, OP clear, MMM Eyes: EOMI, no scleral icterus Respiratory: Clear to  auscultation bilaterally.  No crackles, wheezing or rales Cardiovascular: RRR, -M/R/G, no JVD GI: BS+, soft, nontender Extremities: S/p R BKA, 2+ pitting edema,-tenderness Neuro: AAO x4, CNII-XII grossly intact Psych: Depressed mood, normal affect GU: Foley in place  Imaging, labs and test in EMR in the last 24 hours reviewed independently by me. Pertinent findings below:  Na 135 stable CO2 23 normalized WBC 6.0 HG 9.2 stable  11/26/24 AP 138 stable T bili 3.5 stable Improved AST/ALT  Resolved problem list  Septic shock Multifocal pneumonia - s/p antibiotics (zosyn  x 4d and additional course of ceftriaxone  x 5 days) Acute hypoxemic respiratory failure, resolved  Assessment and Plan   Chronic hypotension requiring vasopressor support No evidence of worsening infection driving hypotension.  Not in heart failure.  Suspect related to poor cardiac function in setting of renal disease - On midodrine  20 mg TID, strattera  20 mg BID. Patient aware that midodrine  can affect heart function - Has femoral a-line  - Restarted on CRRT on 11/22/24 with vasopressor support - Remains on unchanged Levophed  and vasopressin  for MAP goal greater than 60  - Reviewed care with Nephrology team - Nephrology planning to discuss with patient and family as time-limited trial of CRRT demonstrates that he is unlikely to tolerate HD as an outpatient  MRSA bacteremia - Continue Vanc end date 12/09/2024 - Has temporary dialysis catheter in place - Unable to complete TEE at this time - 12/12 Bcx negative, 12/18 Bcx negative - 1/5 Bcx NGTD  Elevated LFTs Elevated Bili Slowly improving with stable bilirubin. Denies abd pain and NT on abd exam. Afebrile. UQ US  w/ single mobile gallstone.  -Trend enzymes  Afib w/ RVR - Currently rate controled  Heart failure with reduced ejection fraction of 20 to 25% Severe aortic stenosis - Not on GDMT due to hypotension  - Follows w/ outpatient cardiology, medical  management - Volume removal per Nephrology  End-stage renal disease on hemodialysis Hypervolemic with 3rd spacing - Nephrology following. On CRRT due to hypotension - Has temp HD catheter, had plans for [facility] but limited due to pressor needs  Type 2 diabetes CBG goal 140-180. Poor appetite so insulin  d/c.   Peripheral vascular disease - On home aspirin   Depressed mood Poor appetite States no symptoms of abd pain, n/v causing decreased appetite. Tearful and flat affect.  -mirtazapine  at bedtime   GOC Palliative following. Discussion w/ pt and pt's daughter who is POA. PMT and CCM expressed concern to both how pt may not tolerate HD outpatient. See 1/6 IPAL note.  -Patient states will follow daughter's decision.  -He is aware that if he is unable to tolerate HD then he will die.  -Daughter is agreeable for comfort focused care if remains in current condition or worsening, would like approx 5 days since  GOC meeting 1/6.  -Palliative care following   The patient is critically ill with multiple organ systems failure and requires high complexity decision making for assessment and support, frequent evaluation and titration of therapies, application of advanced monitoring technologies and extensive interpretation of multiple databases.  Independent Critical Care Time: 40 Minutes.   Slater Staff, M.D. [facility] Pulmonary/Critical Care Medicine 11/28/2024 8:17 AM   Please see Amion for pager number to reach on-call Pulmonary and Critical Care Team.     "

## 2024-11-28 NOTE — Progress Notes (Signed)
 Nutrition Brief Note  Discussed patient during progression this morning. Having some bouts of vtach and potassium being repleted. Nephrology speaking with patient's daughter as he continues with shock and unable to come off CRRT. Per nephrology, daughter is amicable to Hospice, but is taking care of a few legal matters prior to formally transitioning to comfort care. Transition likely tomorrow or Wednesday.  Per documented intake, patient with slight uptick in PO consumption. Per RN, he consumed adequate breakfast and approximately 50% of his Ensure. Continue current diet order continues. Monitor GOC and the need to modify plan of care, if indicated.   INTERVENTION:  Continue current diet as ordered Encourage PO intake Ok for family to bring food items pt may enjoy from home Continue Ensure Plus High Protein BID daily, each supplement provides 350 kcal and 20 grams of protein  Renal MVI with minerals daily If indicated in GOC and pt continues to refuse to eat, recommend placement of cortrak tube and initiation of enteral feeds.   NUTRITION DIAGNOSIS:  Increased nutrient needs related to chronic illness as evidenced by estimated needs. - remains applicable   GOAL:  Patient will meet greater than or equal to 90% of their needs - progressing   MONITOR:  PO intake, Supplement acceptance, Labs, Weight trends  Blair Deaner MS, RD, LDN Registered Dietitian I Clinical Nutrition RD Inpatient Contact Info in Amion

## 2024-11-28 NOTE — TOC Progression Note (Signed)
 Transition of Care (TOC) - Progression Note    Patient Details  Name: [name] DELLER Sr. MRN: [id number] Date of Birth: September 26, 1961  Transition of Care [facility]) CM/SW Contact  Roxie KANDICE Stain, RN Phone Number: 11/28/2024, 12:36 PM  Clinical Narrative:     Plan for patient to transition to comfort care in a few days.  Expected Discharge Plan: Skilled Nursing Facility Barriers to Discharge: Continued Medical Work up, English As A Second Language Teacher, SNF Pending bed offer               Expected Discharge Plan and Services In-house Referral: Clinical Social Work   Post Acute Care Choice: Skilled Nursing Facility Living arrangements for the past 2 months: Single Family Home                                       Social Drivers of Health (SDOH) Interventions SDOH Screenings   Food Insecurity: No Food Insecurity (10/24/2024)  Housing: Low Risk (10/24/2024)  Transportation Needs: No Transportation Needs (10/24/2024)  Utilities: Not At Risk (10/24/2024)  Alcohol  Screen: Low Risk (02/04/2022)  Depression (PHQ2-9): Low Risk (08/06/2022)  Tobacco Use: Low Risk (10/26/2024)    Readmission Risk Interventions    10/25/2024    1:31 PM 02/11/2024    9:09 PM 11/05/2022    3:08 PM  Readmission Risk Prevention Plan  Transportation Screening Complete Complete Complete  Medication Review Oceanographer) Complete Complete Complete  PCP or Specialist appointment within 3-5 days of discharge Not Complete Complete Complete  HRI or Home Care Consult Complete Complete Complete  SW Recovery Care/Counseling Consult Complete  Complete  Palliative Care Screening Not Applicable Not Applicable Complete  Skilled Nursing Facility Not Complete Complete Complete

## 2024-11-28 NOTE — Plan of Care (Signed)
" °  Problem: Education: Goal: Knowledge of General Education information will improve Description: Including pain rating scale, medication(s)/side effects and non-pharmacologic comfort measures Outcome: Not Progressing   Problem: Clinical Measurements: Goal: Ability to maintain clinical measurements within normal limits will improve Outcome: Not Progressing   Problem: Nutrition: Goal: Adequate nutrition will be maintained Outcome: Not Progressing   Problem: Activity: Goal: Ability to tolerate increased activity will improve Outcome: Not Progressing   "

## 2024-11-28 NOTE — Progress Notes (Signed)
 Noted that pt will likely transition to comfort care in next few days. Contacted clinic [facility] to inform of this. Let them know pt will likely not be starting tomorrow as planned.They have not taken him off schedule yet, will let them know once full transition to comfort occurs.   Shelly Spenser Dailysis Navigator [id number]

## 2024-11-28 NOTE — Progress Notes (Signed)
 eLink Physician-Brief Progress Note Patient Name: [name] MADDOCKS Sr. DOB: 10/11/1961 MRN: [id number]   Date of Service  11/28/2024  HPI/Events of Note  23 beats of VT, asymptomatic, on CRRT, norepinephrine  and vasopressin  Last potassium 3.1, not replaced  eICU Interventions  Recheck labs Add potassium supplementation     Intervention Category Minor Interventions: Routine modifications to care plan (e.g. PRN medications for pain, fever)  [name] [name] 11/28/2024, 12:23 AM

## 2024-11-28 NOTE — Progress Notes (Signed)
 " [facility] Progress Note    Assessment/ Plan:   ESRD:  - s/p CRRT 2/20-12/22, then was back on iHD - usual HD is MWF  - severe shock returned on 1/06 and CRRT was re-started - cont CRRT with heparin  thru circuit -unable to liberate from CRRT+pressors, see below   Septic shock - improved initially from MRSA bacteremia and improved - on 2 pressors today, per CCM -on high dose midodrine  20mg  TID   MRSA bacteremia - initial sepsis episode - TDC removed, temp cath replaced after line holiday - bcx's f/u cx's negative - cont IV abx per pmd   Hypervolemia - LE/ UE edema w/ some improvement  - wt's are down  - cont UF 65- 125 cc/hr w/ CRRT    Anemia of ESRD   - s/p PRBC - iron  saturation 8%, gave IV ferric gluconate 250mg  x 2  -s/p ESA -hgb 9.2-stable   GOC - palliative is following  I spoke with patient's daughter Hospital Doctor. We briefly discussed his hospital course and the most pressing issue which is ongoing shock and the need for CRRT since 1/6. We did discuss hospice which I recommended and she is on the same page. She is waiting on some legal things to go through (which should only be shared with her, no other family members). [employer] on transition to hospice should be either Tues or Wed.  Discussed with primary service.   Subjective:   Patient seen and examined on CRRT. Remains on levo and vaso. No complaints/flat affect. Net neg 1.9L   Objective:   BP (!) 78/52   Pulse 89   Temp 98.9 F (37.2 C) (Axillary)   Resp 11   Ht 6' 1 (1.854 m)   Wt 110.9 kg   SpO2 100%   BMI 32.26 kg/m   Intake/Output Summary (Last 24 hours) at 11/28/2024 0846 Last data filed at 11/28/2024 0800 Gross per 24 hour  Intake 1558.12 ml  Output 3682.2 ml  Net -2124.08 ml   Weight change: -0.2 kg  Physical Exam: Gen: chronically ill appearing, flat affect CVS: rrr Resp: cta bl Abd: soft, nt/nd Ext: 2+ pitting edema bl Les Neuro: awake, alert Dialysis access: left  fem temp HD catheter in use  Imaging: No results found.  Labs: BMET Recent Labs  Lab 11/25/24 0320 11/25/24 1510 11/26/24 0444 11/26/24 1706 11/27/24 0218 11/27/24 1525 11/28/24 0027  NA 133* 135 134* 135 134* 138 135  K 4.2 4.1 4.1 3.7 3.7 3.1* 4.3  CL 97* 98 99 102 101 112* 101  CO2 18* 15* 18* 20* 19* 15* 23  GLUCOSE 122* 150* 197* 149* 149* 167* 268*  BUN 13 12 11 10 9  7* 8  CREATININE 2.44* 2.34* 2.15* 2.01* 1.89* 1.45* 1.91*  CALCIUM  8.4* 8.5* 8.3* 8.0* 8.0* 6.1* 8.1*  PHOS 2.6 2.5 2.6 2.5 2.5 1.5* 2.1*   CBC Recent Labs  Lab 11/25/24 0320 11/26/24 0444 11/27/24 0218 11/28/24 0027  WBC 8.0 5.7 5.6 6.0  HGB 9.6* 9.5* 9.3* 9.2*  HCT 28.9* 29.2* 28.0* 28.2*  MCV 81.9 82.3 83.1 83.9  PLT 153 130* 115* 110*    Medications:     aspirin  EC  81 mg Oral Q breakfast   atomoxetine   20 mg Oral BID   Chlorhexidine  Gluconate Cloth  6 each Topical Daily   feeding supplement  237 mL Oral BID BM   guaiFENesin   600 mg Oral BID   heparin   5,000 Units Subcutaneous Q8H  insulin  aspart  0-15 Units Subcutaneous TID WC   midodrine   20 mg Oral Q8H   mirtazapine   7.5 mg Oral QHS   multivitamin  1 tablet Oral QHS   mupirocin  ointment  1 Application Nasal BID   pantoprazole   40 mg Oral Daily   sevelamer  carbonate  800 mg Oral TID WC      Ephriam Stank, MD [facility] 11/28/2024, 8:46 AM   "

## 2024-11-29 DIAGNOSIS — I132 Hypertensive heart and chronic kidney disease with heart failure and with stage 5 chronic kidney disease, or end stage renal disease: Secondary | ICD-10-CM | POA: Diagnosis not present

## 2024-11-29 DIAGNOSIS — I4891 Unspecified atrial fibrillation: Secondary | ICD-10-CM | POA: Diagnosis not present

## 2024-11-29 DIAGNOSIS — E1122 Type 2 diabetes mellitus with diabetic chronic kidney disease: Secondary | ICD-10-CM | POA: Diagnosis not present

## 2024-11-29 DIAGNOSIS — I350 Nonrheumatic aortic (valve) stenosis: Secondary | ICD-10-CM | POA: Diagnosis not present

## 2024-11-29 DIAGNOSIS — I9589 Other hypotension: Secondary | ICD-10-CM | POA: Diagnosis not present

## 2024-11-29 DIAGNOSIS — I5022 Chronic systolic (congestive) heart failure: Secondary | ICD-10-CM | POA: Diagnosis not present

## 2024-11-29 DIAGNOSIS — N186 End stage renal disease: Secondary | ICD-10-CM | POA: Diagnosis not present

## 2024-11-29 LAB — RENAL FUNCTION PANEL
Albumin: 3.3 g/dL — ABNORMAL LOW (ref 3.5–5.0)
Albumin: 3.2 g/dL — ABNORMAL LOW (ref 3.5–5.0)
Anion gap: 13 (ref 5–15)
Anion gap: 12 (ref 5–15)
BUN: 8 mg/dL (ref 8–23)
BUN: 9 mg/dL (ref 8–23)
CO2: 20 mmol/L — ABNORMAL LOW (ref 22–32)
CO2: 21 mmol/L — ABNORMAL LOW (ref 22–32)
Calcium: 8.4 mg/dL — ABNORMAL LOW (ref 8.9–10.3)
Calcium: 8.4 mg/dL — ABNORMAL LOW (ref 8.9–10.3)
Chloride: 100 mmol/L (ref 98–111)
Chloride: 101 mmol/L (ref 98–111)
Creatinine, Ser: 1.87 mg/dL — ABNORMAL HIGH (ref 0.61–1.24)
Creatinine, Ser: 1.78 mg/dL — ABNORMAL HIGH (ref 0.61–1.24)
GFR, Estimated: 40 mL/min — ABNORMAL LOW
GFR, Estimated: 43 mL/min — ABNORMAL LOW
Glucose, Bld: 202 mg/dL — ABNORMAL HIGH (ref 70–99)
Glucose, Bld: 179 mg/dL — ABNORMAL HIGH (ref 70–99)
Phosphorus: 1.9 mg/dL — ABNORMAL LOW (ref 2.5–4.6)
Phosphorus: 1.8 mg/dL — ABNORMAL LOW (ref 2.5–4.6)
Potassium: 4.5 mmol/L (ref 3.5–5.1)
Potassium: 4.3 mmol/L (ref 3.5–5.1)
Sodium: 134 mmol/L — ABNORMAL LOW (ref 135–145)
Sodium: 133 mmol/L — ABNORMAL LOW (ref 135–145)

## 2024-11-29 LAB — MAGNESIUM: Magnesium: 2.3 mg/dL (ref 1.7–2.4)

## 2024-11-29 LAB — APTT: aPTT: 144 s — ABNORMAL HIGH (ref 24–36)

## 2024-11-29 LAB — GLUCOSE, CAPILLARY
Glucose-Capillary: 209 mg/dL — ABNORMAL HIGH (ref 70–99)
Glucose-Capillary: 194 mg/dL — ABNORMAL HIGH (ref 70–99)
Glucose-Capillary: 168 mg/dL — ABNORMAL HIGH (ref 70–99)
Glucose-Capillary: 123 mg/dL — ABNORMAL HIGH (ref 70–99)

## 2024-11-29 LAB — VANCOMYCIN, RANDOM: Vancomycin Rm: 30 ug/mL

## 2024-11-29 MED ORDER — VANCOMYCIN HCL IN DEXTROSE 1-5 GM/200ML-% IV SOLN
1000.0000 mg | INTRAVENOUS | Status: DC
  Administered 2024-11-29: 1000 mg via INTRAVENOUS
  Filled 2024-11-29: qty 200

## 2024-11-29 MED ORDER — OXYCODONE HCL 5 MG PO TABS
5.0000 mg | ORAL_TABLET | Freq: Four times a day (QID) | ORAL | Status: DC | PRN
  Administered 2024-11-29 – 2024-12-09 (×15): 10 mg via ORAL
  Administered 2024-12-10 – 2024-12-13 (×3): 5 mg via ORAL
  Filled 2024-11-29 (×5): qty 2
  Filled 2024-11-29: qty 1
  Filled 2024-11-29 (×5): qty 2
  Filled 2024-11-29: qty 1
  Filled 2024-11-29 (×3): qty 2
  Filled 2024-11-29 (×2): qty 1
  Filled 2024-11-29 (×2): qty 2

## 2024-11-29 NOTE — Plan of Care (Signed)
" °  Problem: Education: Goal: Knowledge of General Education information will improve Description: Including pain rating scale, medication(s)/side effects and non-pharmacologic comfort measures Outcome: Progressing   Problem: Clinical Measurements: Goal: Ability to maintain clinical measurements within normal limits will improve Outcome: Progressing Goal: Will remain free from infection Outcome: Progressing Goal: Diagnostic test results will improve Outcome: Progressing Goal: Respiratory complications will improve Outcome: Progressing Goal: Cardiovascular complication will be avoided Outcome: Progressing   Problem: Activity: Goal: Risk for activity intolerance will decrease Outcome: Progressing   Problem: Nutrition: Goal: Adequate nutrition will be maintained Outcome: Progressing   Problem: Elimination: Goal: Will not experience complications related to bowel motility Outcome: Progressing   Problem: Pain Managment: Goal: General experience of comfort will improve and/or be controlled Outcome: Progressing   Problem: Health Behavior/Discharge Planning: Goal: Ability to manage health-related needs will improve Outcome: Not Progressing   Problem: Clinical Measurements: Goal: Complications related to the disease process, condition or treatment will be avoided or minimized Outcome: Not Progressing   "

## 2024-11-29 NOTE — Progress Notes (Signed)
 Pharmacy Antibiotic Note  [name] [name]. is a 64 y.o. male admitted on 10/24/2024 with MRSA bacteremia and potential endocarditis.  Pharmacy has been consulted for vancomycin  dosing. Patient remains on CRRT - plans to transition to comfort measures tomorrow at 11:00 AM per chart review.   Plan: Will reduce vancomycin  to 1000mg  24h, will time out for this afternoon to start given supra-therapeutic vancomycin  random.  F/u plans to transition to comfort.   Height: 6' 1 (185.4 cm) Weight: 108.6 kg (239 lb 6.7 oz) IBW/kg (Calculated) : 79.9  Temp (24hrs), Avg:98.9 F (37.2 C), Min:97.8 F (36.6 C), Max:99.6 F (37.6 C)  Recent Labs  Lab 11/22/24 1041 11/22/24 1154 11/22/24 1632 11/24/24 0323 11/24/24 1520 11/25/24 0320 11/25/24 1510 11/26/24 0444 11/26/24 1706 11/27/24 0218 11/27/24 1525 11/28/24 0027 11/28/24 1111 11/28/24 1602 11/29/24 0311  WBC  --   --    < > 9.2  --  8.0  --  5.7  --  5.6  --  6.0  --   --   --   CREATININE  --   --    < > 2.96*   < > 2.44*   < > 2.15*   < > 1.89* 1.45* 1.91* 1.88* 1.83* 1.87*  LATICACIDVEN 6.3* 5.7*  --   --   --   --   --   --   --   --   --   --   --   --   --   VANCORANDOM  --   --    < >  --   --   --   --  26  --   --   --   --   --   --  30   < > = values in this interval not displayed.    Estimated Creatinine Clearance: 53 mL/min (A) (by C-G formula based on SCr of 1.87 mg/dL (H)).    Allergies[1]   Dose adjustments this admission: 1/11 VR 26 = vancomycin  reduced to 1250mg  q24h  1/13 VR 30 = vancomycin  reduced to 1000mg  q24h    Thank you for allowing pharmacy to be a part of this patients care.  Powell Blush, PharmD, BCCCP  11/29/2024 9:46 AM     [1]  Allergies Allergen Reactions   Sulfa Antibiotics Diarrhea and Nausea And Vomiting

## 2024-11-29 NOTE — Inpatient Diabetes Management (Signed)
 Inpatient Diabetes Program Recommendations  AACE/ADA: New Consensus Statement on Inpatient Glycemic Control (2015)  Target Ranges:  Prepandial:   less than 140 mg/dL      Peak postprandial:   less than 180 mg/dL (1-2 hours)      Critically ill patients:  140 - 180 mg/dL   Lab Results  Component Value Date   GLUCAP 194 (H) 11/29/2024   HGBA1C 8.6 (H) 10/24/2024    Review of Glycemic Control  Latest Reference Range & Units 11/28/24 07:28 11/28/24 11:15 11/28/24 15:13 11/28/24 20:54 11/29/24 07:51 11/29/24 11:05  Glucose-Capillary 70 - 99 mg/dL 734 (H) 756 (H) 799 (H) 186 (H) 209 (H) 194 (H)  (H): Data is abnormally high  Diabetes history: DM2 Outpatient Diabetes medications: 70/30 30 units QAM, 25 QPM Current orders for Inpatient glycemic control: Novolog  0-15 units TID   Inpatient Diabetes Program Recommendations:     Appears glucose is trending up.  Might consider:   Semglee  12 units every day   Thank you, [name] Pinal, MSN, CDCES Diabetes Coordinator Inpatient Diabetes Program [phone] (team pager from 8a-5p)

## 2024-11-29 NOTE — Progress Notes (Addendum)
 " [facility] Progress Note    Assessment/ Plan:   ESRD:  - s/p CRRT 2/20-12/22, then was back on iHD - usual HD is MWF  - severe shock returned on 1/06 and CRRT was re-started - cont CRRT with heparin  thru circuit until final decisions about hospice are made -unable to liberate from CRRT+pressors, see below   Septic shock - improved initially from MRSA bacteremia and improved - on 2 pressors today, per CCM -on high dose midodrine  20mg  TID   MRSA bacteremia - initial sepsis episode - TDC removed, temp cath replaced after line holiday - bcx's f/u cx's negative - cont IV abx per pmd   Hypervolemia - LE/ UE edema w/ some improvement  - wt's are down  - cont UF 65- 125 cc/hr w/ CRRT    Anemia of ESRD   - s/p PRBC - iron  saturation 8%, gave IV ferric gluconate 250mg  x 2  -s/p ESA -hgb 11.9   GOC - palliative is following  I spoke with patient's daughter Jeoffrey yesterday---->We briefly discussed his hospital course and the most pressing issue which is ongoing shock and the need for CRRT since 1/6. We did discuss hospice which I recommended and she is on the same page. She is waiting on some legal things to go through (which should only be shared with her, no other family members). [employer] on transition to hospice should be either Tues or Wed.  Discussed with primary service.  Addendum: informed that plan is to transition to comfort care today, planning on stopping CRRT at 11am per CCM.   Subjective:   Patient seen and examined on CRRT. Remains on levo and vaso. No complaints/flat affect. Net neg 1.4L   Objective:   BP (!) 78/52   Pulse 85   Temp 98.5 F (36.9 C) (Oral)   Resp (!) 33   Ht 6' 1 (1.854 m)   Wt 108.6 kg   SpO2 91%   BMI 31.59 kg/m   Intake/Output Summary (Last 24 hours) at 11/29/2024 0815 Last data filed at 11/29/2024 0800 Gross per 24 hour  Intake 1396.64 ml  Output 2835.2 ml  Net -1438.56 ml   Weight change: -2.3 kg  Physical  Exam: Gen: chronically ill appearing, flat affect CVS: rrr Resp: cta bl Abd: soft, nt/nd Ext: 1+ pitting edema bl Les Neuro: awake, alert Dialysis access: left fem temp HD catheter in use  Imaging: No results found.  Labs: BMET Recent Labs  Lab 11/26/24 0444 11/26/24 1706 11/27/24 0218 11/27/24 1525 11/28/24 0027 11/28/24 1111 11/28/24 1602 11/28/24 1617 11/29/24 0311  NA 134* 135 134* 138 135 135 136 139 134*  K 4.1 3.7 3.7 3.1* 4.3 4.3 4.3 4.2 4.5  CL 99 102 101 112* 101 102 102  --  100  CO2 18* 20* 19* 15* 23 22 22   --  20*  GLUCOSE 197* 149* 149* 167* 268* 253* 220*  --  202*  BUN 11 10 9  7* 8 9 8   --  8  CREATININE 2.15* 2.01* 1.89* 1.45* 1.91* 1.88* 1.83*  --  1.87*  CALCIUM  8.3* 8.0* 8.0* 6.1* 8.1* 8.1* 8.2*  --  8.4*  PHOS 2.6 2.5 2.5 1.5* 2.1*  --  2.4*  --  1.9*   CBC Recent Labs  Lab 11/25/24 0320 11/26/24 0444 11/27/24 0218 11/28/24 0027 11/28/24 1617  WBC 8.0 5.7 5.6 6.0  --   HGB 9.6* 9.5* 9.3* 9.2* 11.9*  HCT 28.9* 29.2* 28.0* 28.2* 35.0*  MCV 81.9 82.3 83.1 83.9  --   PLT 153 130* 115* 110*  --     Medications:     aspirin  EC  81 mg Oral Q breakfast   atomoxetine   20 mg Oral BID   Chlorhexidine  Gluconate Cloth  6 each Topical Daily   feeding supplement  237 mL Oral BID BM   guaiFENesin   600 mg Oral BID   heparin   5,000 Units Subcutaneous Q8H   insulin  aspart  0-15 Units Subcutaneous TID WC   midodrine   20 mg Oral Q8H   mirtazapine   7.5 mg Oral QHS   multivitamin  1 tablet Oral QHS   mupirocin  ointment  1 Application Nasal BID   pantoprazole   40 mg Oral Daily   sevelamer  carbonate  800 mg Oral TID WC      Ephriam Stank, MD [facility] 11/29/2024, 8:15 AM   "

## 2024-11-29 NOTE — Progress Notes (Signed)
 "  NAME:  [name] [name]., MRN:  [id number], DOB:  07/21/1961, LOS: 36 ADMISSION DATE:  10/24/2024, CONSULTATION DATE:  12/17 REFERRING MD:  Ricky - [facility], CHIEF COMPLAINT:  hypotension  History of Present Illness:  JKAI ARWOOD Sr. is a 64 y.o. male who has a PMH as outlined below including but not limited to ESRD on HD MWF, A-fib, systolic CHF, hypertension, LBBB, DM.  He presented to [facility] ED on 10/24/2024 with dyspnea x roughly 2-3 weeks. He had been seen in the ED a few days early on 12/2 for dysuria and was started on a course of Cipro  which she started the following day and stated that he had been taking as prescribed.     CT chest that demonstrated multifocal pneumonia and small effusions.  Blood cultures ultimately grew MRSA.  He had been on vancomycin  which was continued. He had a left thora 12/10 with 450cc fluid removed.  His HD catheter was removed on 10/28/2024 as well as an arterial line.  He did have a TTE on 12/11 that showed an EF of 20-25%, G2 DD, mild decrease in RV SF, severe AAS, no vegetations.  TEE was recommended but had been deferred due to patient requiring vasopressor support.  ID was consulted and recommended line holiday.  He had a temporary femoral HD cath placed on 12/15.  ID recommendations included 6 weeks of vancomycin  to end on 12/09/2024.    12/17, he was transferred to [facility] for ongoing management for septic shock as well as for IR evaluation for tunneled HD catheter placement  Pertinent  Medical History  ESRD on HD MWF, Afib, HFrEF, T2DM, LBBB, severe PVD s/p R BKA Past Medical History:  Diagnosis Date   ESRD on hemodialysis (HCC)    a. HD MWF > 10 years.   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 01/2022 Echo: EF 45-50%, glob HK, mod LVH, mildly reduced RV fxn, mild MR, mod-sev MV Ca2+. Mild AS; b. 10/2023 Echo: EF 45-50%, glob HK, GrII DD, mildly reduced RV fxn, RVSP 52.65mmHg. Mild MR/MS, mod TR.   Hypertension    Nonischemic  cardiomyopathy (HCC)    a. 2019 Cath [facility]): Nonobstructive disease; b. 12/2019 Echo: EF 50%; c. 12/2019 MV (Tx w/u [facility]): Ant infarct w/ peri-infarct ischemia. EF 35%; c. 05/2021 MV (Tx w/u [facility]): Cor Ca2+, EF 53%, motion artifact, equiv study, likely low risk.   Nonobstructive coronary artery disease    a.  2019 Cath ([facility]): Left main normal, LAD 30 proximal, RCA 30 distal, EF 25%.   Obesity    Peripheral arterial disease    Peripheral vascular disease    Type 2 diabetes mellitus (HCC)    a. 10/2023 - admission w/ [facility]HS.   Significant Hospital Events: Including procedures, antibiotic start and stop dates in addition to other pertinent events   12/8 admit 12/10 L thora with 450cc removed, transudate, neg cultures 12/11 echo 12/12 HD cath and art line removed 12/15 new femoral HD cath placed 12/17 transfer to [facility] 32M 12/19 HD  12/20 CRRT 12/23 Weaned off levophed  this am. HD trial this am. Transferred to [facility] 12/25 Returned to ICU for hypotension 12/28-remains on Levophed  12/29-remains on Levophed , tolerating dialysis 12/31 was off Levophed  this morning 1/2 on Levophed  and had HD 1/4 changed to neo and vaso, stopped levophed  1/5 HD 1/6 CRRT and GOC discussion w/ pt's daughter   Interim History / Subjective:  Remains on  low dose pressor  Plan to transition to comfort care tomorrow  Objective    Blood pressure (!) 78/52, pulse 84, temperature 98.6 F (37 C), temperature source Oral, resp. rate (!) 37, height 6' 1 (1.854 m), weight 108.6 kg, SpO2 94%.        Intake/Output Summary (Last 24 hours) at 11/29/2024 0836 Last data filed at 11/29/2024 0800 Gross per 24 hour  Intake 1396.64 ml  Output 2835.2 ml  Net -1438.56 ml   Filed Weights   11/27/24 0145 11/28/24 0423 11/29/24 0500  Weight: 111.1 kg 110.9 kg 108.6 kg   Physical Exam: General: Chronically ill-appearing, drowsy HENT: [place], AT, OP clear, MMM Eyes: EOMI, no scleral icterus Respiratory: Clear  to auscultation bilaterally.  No crackles, wheezing or rales Cardiovascular: RRR, -M/R/G, no JVD GI: BS+, soft, nontender Extremities: S/p R BKA, 2+ pitting edema,-tenderness Neuro: AAO x2, CNII-XII grossly intact  Imaging, labs and test in EMR in the last 24 hours reviewed independently by me. Pertinent findings below:  ABG wnl  Na 134 CO2 20 WBC 6.0 HG 9.2 stable  11/26/24 AP 138 stable T bili 3.5 stable Improved AST/ALT  Resolved problem list  Septic shock Multifocal pneumonia - s/p antibiotics (zosyn  x 4d and additional course of ceftriaxone  x 5 days) Acute hypoxemic respiratory failure, resolved  Assessment and Plan   Chronic hypotension requiring vasopressor support No evidence of worsening infection driving hypotension.  Not in heart failure.  Suspect related to poor cardiac function in setting of renal disease - On midodrine  20 mg TID, strattera  20 mg BID. Patient aware that midodrine  can affect heart function - Has femoral a-line  - Restarted on CRRT on 11/22/24 with vasopressor support - Remains on unchanged levophed  and vasopressin  for MAP goal greater than 60  - Reviewed care with Nephrology. No further dialysis. Patient unable to tolerate as an outpatient. Recommended comfort measures  MRSA bacteremia - Continue Vanc end date 12/09/2024 - Has temporary dialysis catheter in place - Unable to complete TEE at this time - 12/12 Bcx negative, 12/18 Bcx negative - 1/5 Bcx NGTD  Elevated LFTs Elevated Bili Slowly improving with stable bilirubin. Denies abd pain and NT on abd exam. Afebrile. UQ US  w/ single mobile gallstone.  -Trend enzymes  Afib w/ RVR - Currently rate controled  Heart failure with reduced ejection fraction of 20 to 25% Severe aortic stenosis - Not on GDMT due to hypotension  - Follows w/ outpatient cardiology, medical management - Volume removal per Nephrology  End-stage renal disease on hemodialysis Hypervolemic with 3rd spacing -  Nephrology following. On CRRT until withdrawal of care - Has temp HD catheter, had plans for [facility] but limited due to pressor needs  Type 2 diabetes CBG goal 140-180. Poor appetite so insulin  d/c.   Peripheral vascular disease - On home aspirin   Depressed mood Poor appetite States no symptoms of abd pain, n/v causing decreased appetite. Tearful and flat affect.  -mirtazapine  at bedtime   GOC Palliative following. Discussion w/ pt and pt's daughter who is POA. PMT and CCM expressed concern to both how pt may not tolerate HD outpatient. See 1/6 IPAL note.  -Patient states will follow daughter's decision.  -He is aware that if he is unable to tolerate HD then he will die.  -Daughter has requested to discontinue dialysis and life support tomorrow (11/30/24) at 11 AM tomorrow after family has visited. Will change code status to DNR and continue current medical measures until we withdraw tomorrow. Daughter, Hospital Doctor,  has agreed to plan   The patient is critically ill with multiple organ systems failure and requires high complexity decision making for assessment and support, frequent evaluation and titration of therapies, application of advanced monitoring technologies and extensive interpretation of multiple databases.  Independent Critical Care Time: 45 Minutes.   Slater Staff, M.D. [facility] Pulmonary/Critical Care Medicine 11/29/2024 8:36 AM   Please see Amion for pager number to reach on-call Pulmonary and Critical Care Team.    "

## 2024-11-29 NOTE — Progress Notes (Signed)
 PT Cancellation Note  Patient Details Name: [name] FEBLES Sr. MRN: [id number] DOB: Apr 07, 1961   Cancelled Treatment:    Reason Eval/Treat Not Completed: Patient declined, no reason specified; encouraged to participate in bed level therex and mobility but pt continued to decline.  Will sign off as noted for transition to comfort care.    Montie Portal 11/29/2024, 1:42 PM Micheline Portal, PT Acute Rehabilitation Services Office:[phone] 11/29/2024

## 2024-11-29 NOTE — Plan of Care (Signed)
" °  Problem: Education: Goal: Knowledge of General Education information will improve Description: Including pain rating scale, medication(s)/side effects and non-pharmacologic comfort measures Outcome: Progressing   Problem: Clinical Measurements: Goal: Ability to maintain clinical measurements within normal limits will improve Outcome: Progressing Goal: Will remain free from infection Outcome: Progressing Goal: Diagnostic test results will improve Outcome: Progressing Goal: Respiratory complications will improve Outcome: Progressing   Problem: Safety: Goal: Ability to remain free from injury will improve Outcome: Progressing   Problem: Respiratory: Goal: Ability to maintain adequate ventilation will improve Outcome: Progressing Goal: Ability to maintain a clear airway will improve Outcome: Progressing   "

## 2024-11-29 NOTE — Progress Notes (Addendum)
 OT Discharge Note  Patient Details Name: [name] LESSER Sr. MRN: [id number] DOB: December 25, 1960   Cancelled Treatment:    Reason Eval/Treat Not Completed: Other (comment) (comfort care will sign off now) pt refused attempt today   Ely Molt 11/29/2024, 11:21 AM

## 2024-11-29 NOTE — Plan of Care (Signed)

## 2024-11-30 DIAGNOSIS — I5022 Chronic systolic (congestive) heart failure: Secondary | ICD-10-CM | POA: Diagnosis not present

## 2024-11-30 DIAGNOSIS — I132 Hypertensive heart and chronic kidney disease with heart failure and with stage 5 chronic kidney disease, or end stage renal disease: Secondary | ICD-10-CM | POA: Diagnosis not present

## 2024-11-30 DIAGNOSIS — E1122 Type 2 diabetes mellitus with diabetic chronic kidney disease: Secondary | ICD-10-CM | POA: Diagnosis not present

## 2024-11-30 DIAGNOSIS — I350 Nonrheumatic aortic (valve) stenosis: Secondary | ICD-10-CM | POA: Diagnosis not present

## 2024-11-30 DIAGNOSIS — I9589 Other hypotension: Secondary | ICD-10-CM | POA: Diagnosis not present

## 2024-11-30 DIAGNOSIS — N186 End stage renal disease: Secondary | ICD-10-CM | POA: Diagnosis not present

## 2024-11-30 DIAGNOSIS — I4891 Unspecified atrial fibrillation: Secondary | ICD-10-CM | POA: Diagnosis not present

## 2024-11-30 LAB — GLUCOSE, CAPILLARY: Glucose-Capillary: 139 mg/dL — ABNORMAL HIGH (ref 70–99)

## 2024-11-30 MED ORDER — GLYCOPYRROLATE 0.2 MG/ML IJ SOLN: 0.2000 mg | INTRAMUSCULAR | Status: DC | PRN

## 2024-11-30 MED ORDER — SODIUM CHLORIDE 0.9 % IV SOLN: INTRAVENOUS | Status: DC

## 2024-11-30 MED ORDER — LORAZEPAM 2 MG/ML IJ SOLN: 2.0000 mg | INTRAMUSCULAR | Status: DC | PRN

## 2024-11-30 MED ORDER — DIPHENHYDRAMINE HCL 25 MG PO CAPS
25.0000 mg | ORAL_CAPSULE | Freq: Four times a day (QID) | ORAL | Status: DC | PRN
  Administered 2024-11-30 – 2024-12-13 (×8): 25 mg via ORAL
  Filled 2024-11-30 (×8): qty 1

## 2024-11-30 MED ORDER — ACETAMINOPHEN 650 MG RE SUPP: 650.0000 mg | Freq: Four times a day (QID) | RECTAL | Status: DC | PRN

## 2024-11-30 MED ORDER — GLYCOPYRROLATE 1 MG PO TABS: 1.0000 mg | ORAL_TABLET | ORAL | Status: DC | PRN

## 2024-11-30 MED ORDER — OXYMETAZOLINE HCL 0.05 % NA SOLN
1.0000 | Freq: Two times a day (BID) | NASAL | Status: AC
  Administered 2024-11-30 – 2024-12-03 (×3): 1 via NASAL
  Filled 2024-11-30: qty 30

## 2024-11-30 MED ORDER — POLYVINYL ALCOHOL 1.4 % OP SOLN: 1.0000 [drp] | Freq: Four times a day (QID) | OPHTHALMIC | Status: DC | PRN

## 2024-11-30 MED ORDER — ACETAMINOPHEN 325 MG PO TABS
650.0000 mg | ORAL_TABLET | Freq: Four times a day (QID) | ORAL | Status: DC | PRN
  Administered 2024-12-06 (×2): 650 mg via ORAL
  Filled 2024-11-30 (×2): qty 2

## 2024-11-30 MED ORDER — MORPHINE 100MG IN NS 100ML (1MG/ML) PREMIX INFUSION: 0.0000 mg/h | INTRAVENOUS | Status: DC

## 2024-11-30 MED ORDER — MORPHINE BOLUS VIA INFUSION: 5.0000 mg | INTRAVENOUS | Status: DC | PRN

## 2024-11-30 NOTE — Progress Notes (Addendum)
 " [facility] Progress Note    Assessment/ Plan:   ESRD:  - s/p CRRT 2/20-12/22, then was back on iHD - usual HD is MWF  - severe shock returned on 1/06 and CRRT was re-started - cont CRRT with heparin  thru circuit until 11am then proceeding with comfort care -unable to liberate from CRRT+ pressors,    Septic shock - improved initially from MRSA bacteremia and improved - on 2 pressors today, per CCM -on high dose midodrine  20mg  TID   MRSA bacteremia - initial sepsis episode - TDC removed, temp cath replaced after line holiday - bcx's f/u cx's negative - cont IV abx per pmd   Hypervolemia - uf as tolerated   Anemia of ESRD   - s/p PRBC - iron  saturation 8%, gave IV ferric gluconate 250mg  x 2  -s/p ESA -hgb 11.9--acceptable   GOC - palliative is following -plan to stop CRRT/treatments at 11am and then proceed with comfort care  Discussed with primary service.   Addendum: transitioned to comfort care. Will sign off. Please call with any questions/concerns in the interim.  Subjective:   Patient seen and examined on CRRT. Remains on levo and vaso. No complaints/flat affect. Plan to stop treatment at 11am and then proceed with comfort care Pressors: levo and vaso   Objective:   BP (!) 78/52   Pulse 83   Temp (!) 97.5 F (36.4 C) (Oral)   Resp (!) 29   Ht 6' 1 (1.854 m)   Wt 105.6 kg   SpO2 94%   BMI 30.71 kg/m   Intake/Output Summary (Last 24 hours) at 11/30/2024 0850 Last data filed at 11/30/2024 0800 Gross per 24 hour  Intake 1002.73 ml  Output 2929 ml  Net -1926.27 ml   Weight change: -3 kg  Physical Exam: Gen: chronically ill appearing, flat affect CVS: rrr Resp: cta bl Abd: soft, nt/nd Ext: 1+ pitting edema bl Les Neuro: awake, alert Dialysis access: left fem temp HD catheter in use  Imaging: No results found.  Labs: BMET Recent Labs  Lab 11/26/24 1706 11/27/24 0218 11/27/24 1525 11/28/24 0027 11/28/24 1111  11/28/24 1602 11/28/24 1617 11/29/24 0311 11/29/24 1513  NA 135 134* 138 135 135 136 139 134* 133*  K 3.7 3.7 3.1* 4.3 4.3 4.3 4.2 4.5 4.3  CL 102 101 112* 101 102 102  --  100 101  CO2 20* 19* 15* 23 22 22   --  20* 21*  GLUCOSE 149* 149* 167* 268* 253* 220*  --  202* 179*  BUN 10 9 7* 8 9 8   --  8 9  CREATININE 2.01* 1.89* 1.45* 1.91* 1.88* 1.83*  --  1.87* 1.78*  CALCIUM  8.0* 8.0* 6.1* 8.1* 8.1* 8.2*  --  8.4* 8.4*  PHOS 2.5 2.5 1.5* 2.1*  --  2.4*  --  1.9* 1.8*   CBC Recent Labs  Lab 11/25/24 0320 11/26/24 0444 11/27/24 0218 11/28/24 0027 11/28/24 1617  WBC 8.0 5.7 5.6 6.0  --   HGB 9.6* 9.5* 9.3* 9.2* 11.9*  HCT 28.9* 29.2* 28.0* 28.2* 35.0*  MCV 81.9 82.3 83.1 83.9  --   PLT 153 130* 115* 110*  --     Medications:     aspirin  EC  81 mg Oral Q breakfast   atomoxetine   20 mg Oral BID   Chlorhexidine  Gluconate Cloth  6 each Topical Daily   feeding supplement  237 mL Oral BID BM   guaiFENesin   600 mg Oral BID  heparin   5,000 Units Subcutaneous Q8H   insulin  aspart  0-15 Units Subcutaneous TID WC   midodrine   20 mg Oral Q8H   mirtazapine   7.5 mg Oral QHS   multivitamin  1 tablet Oral QHS   mupirocin  ointment  1 Application Nasal BID   pantoprazole   40 mg Oral Daily   sevelamer  carbonate  800 mg Oral TID WC      Ephriam Stank, MD [facility] 11/30/2024, 8:50 AM   "

## 2024-11-30 NOTE — Progress Notes (Signed)
 "  NAME:  [name] Hoon., MRN:  [id number], DOB:  02/17/1961, LOS: 37 ADMISSION DATE:  10/24/2024, CONSULTATION DATE:  12/17 REFERRING MD:  Ricky - [facility], CHIEF COMPLAINT:  hypotension  History of Present Illness:  [name] MONTE Sr. is a 64 y.o. male who has a PMH as outlined below including but not limited to ESRD on HD MWF, A-fib, systolic CHF, hypertension, LBBB, DM.  He presented to [facility] ED on 10/24/2024 with dyspnea x roughly 2-3 weeks. He had been seen in the ED a few days early on 12/2 for dysuria and was started on a course of Cipro  which she started the following day and stated that he had been taking as prescribed.     CT chest that demonstrated multifocal pneumonia and small effusions.  Blood cultures ultimately grew MRSA.  He had been on vancomycin  which was continued. He had a left thora 12/10 with 450cc fluid removed.  His HD catheter was removed on 10/28/2024 as well as an arterial line.  He did have a TTE on 12/11 that showed an EF of 20-25%, G2 DD, mild decrease in RV SF, severe AAS, no vegetations.  TEE was recommended but had been deferred due to patient requiring vasopressor support.  ID was consulted and recommended line holiday.  He had a temporary femoral HD cath placed on 12/15.  ID recommendations included 6 weeks of vancomycin  to end on 12/09/2024.    12/17, he was transferred to [facility] for ongoing management for septic shock as well as for IR evaluation for tunneled HD catheter placement  Pertinent  Medical History  ESRD on HD MWF, Afib, HFrEF, T2DM, LBBB, severe PVD s/p R BKA Past Medical History:  Diagnosis Date   ESRD on hemodialysis (HCC)    a. HD MWF > 10 years.   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 01/2022 Echo: EF 45-50%, glob HK, mod LVH, mildly reduced RV fxn, mild MR, mod-sev MV Ca2+. Mild AS; b. 10/2023 Echo: EF 45-50%, glob HK, GrII DD, mildly reduced RV fxn, RVSP 52.63mmHg. Mild MR/MS, mod TR.   Hypertension    Nonischemic  cardiomyopathy (HCC)    a. 2019 Cath [facility]): Nonobstructive disease; b. 12/2019 Echo: EF 50%; c. 12/2019 MV (Tx w/u [facility]): Ant infarct w/ peri-infarct ischemia. EF 35%; c. 05/2021 MV (Tx w/u [facility]): Cor Ca2+, EF 53%, motion artifact, equiv study, likely low risk.   Nonobstructive coronary artery disease    a.  2019 Cath ([place] [facility]): Left main normal, LAD 30 proximal, RCA 30 distal, EF 25%.   Obesity    Peripheral arterial disease    Peripheral vascular disease    Type 2 diabetes mellitus (HCC)    a. 10/2023 - admission w/ HHS.   Significant Hospital Events: Including procedures, antibiotic start and stop dates in addition to other pertinent events   12/8 admit 12/10 L thora with 450cc removed, transudate, neg cultures 12/11 echo 12/12 HD cath and art line removed 12/15 new femoral HD cath placed 12/17 transfer to [facility] 71M 12/19 HD  12/20 CRRT 12/23 Weaned off levophed  this am. HD trial this am. Transferred to [facility] 12/25 Returned to ICU for hypotension 12/28-remains on Levophed  12/29-remains on Levophed , tolerating dialysis 12/31 was off Levophed  this morning 1/2 on Levophed  and had HD 1/4 changed to neo and vaso, stopped levophed  1/5 HD 1/6 CRRT and GOC discussion w/ pt's daughter  1/14 CRRT discontinued. Plan to transition to comfort  care  Interim History / Subjective:  Remains on low dose pressors Family present and discussed process to transition to comfort care  Objective    Blood pressure (!) 78/52, pulse 83, temperature (!) 97.5 F (36.4 C), temperature source Oral, resp. rate 20, height 6' 1 (1.854 m), weight 105.6 kg, SpO2 94%.        Intake/Output Summary (Last 24 hours) at 11/30/2024 1047 Last data filed at 11/30/2024 1000 Gross per 24 hour  Intake 1000.6 ml  Output 2929 ml  Net -1928.4 ml   Filed Weights   11/28/24 0423 11/29/24 0500 11/30/24 0500  Weight: 110.9 kg 108.6 kg 105.6 kg   Physical Exam: General: Chronically ill-appearing, no  acute distress HENT: [place], AT, OP clear, MMM Eyes: EOMI, no scleral icterus Respiratory: Diminished  to auscultation bilaterally.  No crackles, wheezing or rales Cardiovascular: RRR, -M/R/G, no JVD Extremities: S/p R BKA, 1+ pitting edema,-tenderness Neuro: AAO x4, CNII-XII grossly intact Psych: Normal mood, normal affect   Resolved problem list  Septic shock Multifocal pneumonia - s/p antibiotics (zosyn  x 4d and additional course of ceftriaxone  x 5 days) Acute hypoxemic respiratory failure, resolved  Assessment and Plan   Chronic hypotension requiring vasopressor support No evidence of worsening infection driving hypotension.  Not in heart failure.  Suspect related to poor cardiac function in setting of renal disease - On midodrine  20 mg TID, strattera  20 mg BID. Patient aware that midodrine  can affect heart function - Has femoral a-line  - Restarted on CRRT on 11/22/24 with vasopressor support - Remains on unchanged levophed  and vasopressin  for MAP goal greater than 60  - Reviewed care with Nephrology. No further dialysis. Patient unable to tolerate as an outpatient. Recommended comfort measures  MRSA bacteremia - Continue Vanc end date 12/09/2024 - Has temporary dialysis catheter in place - Unable to complete TEE at this time - 12/12 Bcx negative, 12/18 Bcx negative - 1/5 Bcx NGTD  Elevated LFTs Elevated Bili Slowly improving with stable bilirubin. Denies abd pain and NT on abd exam. Afebrile. UQ US  w/ single mobile gallstone.  -Trend enzymes  Afib w/ RVR - Currently rate controled  Heart failure with reduced ejection fraction of 20 to 25% Severe aortic stenosis - Not on GDMT due to hypotension  - Follows w/ outpatient cardiology, medical management - Volume removal per Nephrology  End-stage renal disease on hemodialysis Hypervolemic with 3rd spacing - Nephrology following. On CRRT until withdrawal of care - Has temp HD catheter, had plans for [facility] but limited due to  pressor needs  Type 2 diabetes CBG goal 140-180. Poor appetite so insulin  d/c.   Peripheral vascular disease - On home aspirin   Depressed mood Poor appetite States no symptoms of abd pain, n/v causing decreased appetite. Tearful and flat affect.  -mirtazapine  at bedtime   GOC Palliative following. Discussion w/ pt and pt's daughter who is POA. PMT and CCM expressed concern to both how pt may not tolerate HD outpatient. See 1/6 IPAL note.  -Patient states will follow daughter's decision.  -He is aware that if he is unable to tolerate HD then he will die.  -Family at bedside. Discussed process to transition to comfort care. Addressed questions and concerns. Transitioning to comfort care today. CRRT discontinued and vasopressor will be weaned off when final family member arrives.  The patient is critically ill with multiple organ systems failure and requires high complexity decision making for assessment and support, frequent evaluation and titration of therapies, application of advanced monitoring  technologies and extensive interpretation of multiple databases.  Independent Critical Care Time: 30 Minutes.   [name] [name], M.D. [facility] 11/30/2024 10:52 AM   Please see Amion for pager number to reach on-call Pulmonary and Critical Care Team.  "

## 2024-12-01 DIAGNOSIS — J188 Other pneumonia, unspecified organism: Secondary | ICD-10-CM | POA: Diagnosis not present

## 2024-12-01 DIAGNOSIS — Z515 Encounter for palliative care: Secondary | ICD-10-CM | POA: Diagnosis not present

## 2024-12-01 DIAGNOSIS — I5022 Chronic systolic (congestive) heart failure: Secondary | ICD-10-CM | POA: Diagnosis not present

## 2024-12-01 DIAGNOSIS — I739 Peripheral vascular disease, unspecified: Secondary | ICD-10-CM | POA: Diagnosis not present

## 2024-12-01 DIAGNOSIS — N189 Chronic kidney disease, unspecified: Secondary | ICD-10-CM | POA: Diagnosis not present

## 2024-12-01 DIAGNOSIS — I959 Hypotension, unspecified: Secondary | ICD-10-CM | POA: Diagnosis not present

## 2024-12-01 DIAGNOSIS — Z66 Do not resuscitate: Secondary | ICD-10-CM | POA: Diagnosis not present

## 2024-12-01 DIAGNOSIS — J189 Pneumonia, unspecified organism: Secondary | ICD-10-CM | POA: Diagnosis not present

## 2024-12-01 DIAGNOSIS — I2510 Atherosclerotic heart disease of native coronary artery without angina pectoris: Secondary | ICD-10-CM | POA: Diagnosis not present

## 2024-12-01 DIAGNOSIS — E1122 Type 2 diabetes mellitus with diabetic chronic kidney disease: Secondary | ICD-10-CM | POA: Diagnosis not present

## 2024-12-01 DIAGNOSIS — Z7189 Other specified counseling: Secondary | ICD-10-CM | POA: Diagnosis not present

## 2024-12-01 MED ORDER — SODIUM CHLORIDE 0.9% FLUSH: 10.0000 mL | INTRAVENOUS | Status: DC | PRN

## 2024-12-01 MED ORDER — ZINC OXIDE 40 % EX OINT
TOPICAL_OINTMENT | Freq: Two times a day (BID) | CUTANEOUS | Status: DC
  Filled 2024-12-01 (×4): qty 57

## 2024-12-01 MED ORDER — ZINC OXIDE 40 % EX OINT: TOPICAL_OINTMENT | Freq: Two times a day (BID) | CUTANEOUS | Status: DC

## 2024-12-01 MED ORDER — CHLORHEXIDINE GLUCONATE CLOTH 2 % EX PADS
6.0000 | MEDICATED_PAD | Freq: Every day | CUTANEOUS | Status: DC
  Administered 2024-12-03 – 2024-12-14 (×8): 6 via TOPICAL

## 2024-12-01 MED ORDER — HYDROMORPHONE HCL 2 MG PO TABS
1.0000 mg | ORAL_TABLET | ORAL | Status: DC | PRN
  Filled 2024-12-01: qty 1

## 2024-12-01 NOTE — Progress Notes (Signed)
 Noted that pt has transitioned to comfort measures. Contacted [facility] clinic and informed of this, no further support needed.   Lavanda Camelia Stelzner Dialysis Navigator [phone]

## 2024-12-01 NOTE — Progress Notes (Signed)
 " Progress Note    [name] ALKIRE Sr.   FMW:[id number]  DOB: [birth year]  DOA: 10/24/2024     38 PCP: [name], MD  Initial CC: Dyspnea  Hospital Course: [name] [name] is a 64 y.o. male who has a PMH as outlined below including but not limited to ESRD on HD MWF, A-fib, systolic CHF, hypertension, LBBB, DM.  He presented to [facility] ED on 10/24/2024 with dyspnea x roughly 2-3 weeks. He had been seen in the ED a few days early on 12/2 for dysuria and was started on a course of Cipro  which she started the following day and stated that he had been taking as prescribed.     CT chest that demonstrated multifocal pneumonia and small effusions.  Blood cultures ultimately grew MRSA.  He had been on vancomycin  which was continued. He had a left thora 12/10 with 450cc fluid removed.  His HD catheter was removed on 10/28/2024 as well as an arterial line.  He did have a TTE on 12/11 that showed an EF of 20-25%, G2 DD, mild decrease in RV SF, severe AAS, no vegetations.  TEE was recommended but had been deferred due to patient requiring vasopressor support.  ID was consulted and recommended line holiday.  He had a temporary femoral HD cath placed on 12/15.  ID recommendations included 6 weeks of vancomycin  to end on 12/09/2024.    12/17, he was transferred to [facility] for ongoing management for septic shock as well as for IR evaluation for tunneled HD catheter placement  Significant Hospital Events:  12/8 admit 12/10 L thora with 450cc removed, transudate, neg cultures 12/11 echo 12/12 HD cath and art line removed 12/15 new femoral HD cath placed 12/17 transfer to [facility] 63M 12/19 HD  12/20 CRRT 12/23 Weaned off levophed  this am. HD trial this am. Transferred to [facility] 12/25 Returned to ICU for hypotension 12/28-remains on Levophed  12/29-remains on Levophed , tolerating dialysis 12/31 was off Levophed  this morning 1/2 on Levophed  and had HD 1/4 changed to neo and vaso, stopped levophed  1/5  HD 1/6 CRRT and GOC discussion w/ pt's daughter  1/14 CRRT discontinued. Plan to transition to comfort care   Resolved problem list  Septic shock Multifocal pneumonia - s/p antibiotics (zosyn  x 4d and additional course of ceftriaxone  x 5 days) Acute hypoxemic respiratory failure, resolved   Assessment and Plan    Chronic hypotension requiring vasopressor support No evidence of worsening infection driving hypotension.  Not in heart failure.  Suspect related to poor cardiac function in setting of renal disease - Has been trialed on midodrine  and Strattera  which have now been discontinued in setting of comfort care - s/p CRRT with vasopressor dependence; now being transitioned off dialysis and vasopressor support to comfort care   MRSA bacteremia - s/p vanc; was to complete extended course to 1/23, but stopped in setting of comfort care - Unable to complete TEE at this time - 12/12 Bcx negative, 12/18 Bcx negative - 1/5 Bcx NGTD   Elevated LFTs Elevated Bili Slowly improving with stable bilirubin. Denies abd pain and NT on abd exam. Afebrile. UQ US  w/ single mobile gallstone.  - continue comfort care    Afib w/ RVR - Currently rate controled - continue comfort care    Heart failure with reduced ejection fraction of 20 to 25% Severe aortic stenosis - Not on GDMT due to hypotension  - continue comfort care   End-stage renal disease on hemodialysis Hypervolemic with  3rd spacing - continue comfort care  - No further dialysis   Type 2 diabetes CBG goal 140-180. Poor appetite so insulin  d/c.  - continue comfort care    Peripheral vascular disease - Aspirin  discontinued in setting of comfort care   Depressed mood Poor appetite - continue comfort care    GOC Palliative following. Discussion w/ pt and pt's daughter who is POA. PMT and CCM expressed concern to both how pt may not tolerate HD outpatient. See 1/6 IPAL note.  -Patient states will follow daughter's decision.   -He is aware that if he is unable to tolerate HD then he will die.  -Family at bedside. Discussed process to transition to comfort care. Addressed questions and concerns. Transitioning to comfort care 11/30/24. CRRT discontinued and vasopressor weaned off  Interval History:  No events overnight.  Transferred out of the ICU yesterday. Officially transitioned to comfort care for end-of-life management. Brothers are present bedside this morning.  Expectation is comfort care in the hospital with in hospital death anticipated.   Consultants:  Palliative care     Code Status:   Code Status: Do not attempt resuscitation (DNR) - Comfort care  Barriers to discharge: None Therapy evaluation: PT Orders:   PT Follow up Rec: Skilled Nursing-Short Term Rehab (<3 Hours/Day)11/25/2024 1500  Disposition Plan: Inpatient comfort care Status is: DNR  Mobility Assessment (Last 72 Hours)     Mobility Assessment     Row Name 12/01/24 0750 12/01/24 0130 11/30/24 0745 11/29/24 2000 11/29/24 0800   Does the patient have exclusion criteria? No- Perform mobility assessment Yes- Bedfast (Level 1) - Select exclusion criteria in next row Yes- Bedfast (Level 1) - Select exclusion criteria in next row No- Perform mobility assessment No- Perform mobility assessment   Mobility Assessment Exclusion Criteria No exclusion criteria present, perform mobility assessment No exclusion criteria present, perform mobility assessment No exclusion criteria present, perform mobility assessment -- --   What is the highest level of mobility based on the mobility assessment? Level 1 (Bedfast) - Unable to balance while sitting on edge of bed Level 1 (Bedfast) - Unable to balance while sitting on edge of bed Level 1 (Bedfast) - Unable to balance while sitting on edge of bed Level 1 (Bedfast) - Unable to balance while sitting on edge of bed Level 1 (Bedfast) - Unable to balance while sitting on edge of bed   Is the above level different  from baseline mobility prior to current illness? Yes - Recommend PT order Yes - Recommend PT order Yes - Recommend PT order -- Yes - Recommend PT order    Row Name 11/28/24 2000           Does the patient have exclusion criteria? No- Perform mobility assessment       What is the highest level of mobility based on the mobility assessment? Level 1 (Bedfast) - Unable to balance while sitting on edge of bed          Diet: Diet Orders (From admission, onward)     Start     Ordered   12/01/24 0919  Diet regular Room service appropriate? Yes with Assist; Fluid consistency: Thin  Diet effective now       Question Answer Comment  Room service appropriate? Yes with Assist   Fluid consistency: Thin      12/01/24 0918            Objective: Blood pressure (!) 82/40, pulse 81, temperature 97.8 F (36.6 C),  temperature source Oral, resp. rate 16, height 6' 1 (1.854 m), weight 105.6 kg, SpO2 100%.  Examination:  Physical Exam Constitutional:      Appearance: Normal appearance.     Comments: Resting in bed in no distress significantly lethargic  HENT:     Head: Normocephalic and atraumatic.     Mouth/Throat:     Mouth: Mucous membranes are moist.  Eyes:     Extraocular Movements: Extraocular movements intact.  Cardiovascular:     Rate and Rhythm: Normal rate and regular rhythm.  Pulmonary:     Effort: Pulmonary effort is normal. No respiratory distress.     Breath sounds: Normal breath sounds. No wheezing.  Abdominal:     General: Bowel sounds are normal. There is no distension.     Palpations: Abdomen is soft.     Tenderness: There is no abdominal tenderness.  Musculoskeletal:        General: Normal range of motion.     Cervical back: Normal range of motion and neck supple.  Skin:    General: Skin is warm and dry.  Neurological:     Comments: Able to squeeze fingers on command and wiggle toes.  Significant weakness noted      Data Reviewed: No results found for this or  any previous visit (from the past 24 hours).  I have reviewed pertinent nursing notes, vitals, labs, and images as necessary. I have ordered labwork to follow up on as indicated.  I have reviewed the last notes from staff over past 24 hours. I have discussed patient's care plan and test results with nursing staff, CM/SW, and other staff as appropriate.  Old records reviewed in assessment of this patient  Time spent: Greater than 50% of the 55 minute visit was spent in counseling/coordination of care for the patient as laid out in the A&P.   LOS: 38 days   Alm Apo, MD [facility] 12/01/2024, 1:19 PM "

## 2024-12-01 NOTE — Progress Notes (Signed)
 Spoke to pt daughter, Jeoffrey this morning. She called for an update regarding how he did over night. All questions and concerns were addressed.

## 2024-12-01 NOTE — Progress Notes (Signed)
 Nutrition Brief Note  Chart reviewed. Pt now transitioning to comfort care.  No further nutrition interventions planned at this time.  Please re-consult as needed.   Olivia Kenning, RD Registered Dietitian  See Amion for more information

## 2024-12-01 NOTE — Plan of Care (Signed)
  Problem: Education: Goal: Knowledge of General Education information will improve Description: Including pain rating scale, medication(s)/side effects and non-pharmacologic comfort measures Outcome: Progressing   Problem: Health Behavior/Discharge Planning: Goal: Ability to manage health-related needs will improve Outcome: Progressing   Problem: Activity: Goal: Risk for activity intolerance will decrease Outcome: Progressing   Problem: Nutrition: Goal: Adequate nutrition will be maintained Outcome: Progressing   Problem: Elimination: Goal: Will not experience complications related to bowel motility Outcome: Progressing   

## 2024-12-01 NOTE — Progress Notes (Signed)
 " Daily Progress Note   Date: 12/01/2024   Patient Name: [name] BRAZIE Sr.  DOB: Dec 07, 1960  MRN: [id number]  Age / Sex: 64 y.o., male  Attending Physician: Patsy Lenis, MD Primary Care Physician: [name], MD Admit Date: 10/24/2024 Length of Stay: 38 days  Reason for Follow-up: Establishing goals of care  Past Medical History:  Diagnosis Date   ESRD on hemodialysis (HCC)    a. HD MWF > 10 years.   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 01/2022 Echo: EF 45-50%, glob HK, mod LVH, mildly reduced RV fxn, mild MR, mod-sev MV Ca2+. Mild AS; b. 10/2023 Echo: EF 45-50%, glob HK, GrII DD, mildly reduced RV fxn, RVSP 52.69mmHg. Mild MR/MS, mod TR.   Hypertension    Nonischemic cardiomyopathy (HCC)    a. 2019 Cath [facility]): Nonobstructive disease; b. 12/2019 Echo: EF 50%; c. 12/2019 MV (Tx w/u [facility]): Ant infarct w/ peri-infarct ischemia. EF 35%; c. 05/2021 MV (Tx w/u [facility]): Cor Ca2+, EF 53%, motion artifact, equiv study, likely low risk.   Nonobstructive coronary artery disease    a.  2019 Cath ([facility]): Left main normal, LAD 30 proximal, RCA 30 distal, EF 25%.   Obesity    Peripheral arterial disease    Peripheral vascular disease    Type 2 diabetes mellitus (HCC)    a. 10/2023 - admission w/ HHS.    Assessment & Plan:   HPI/Patient Profile:   64 y.o. male  with past medical history of ESRD on HD, HFrEF (20-25% EF), T2DM, atrial fibrillation admitted on 10/24/2024 with MRSA bacteremia and AHRF 2/2 pneumonia. Per H&P on 10/24/2024 by Pearlean MD, patient presented to [facility] for 2-3 weeks of shortness of breath. CT chest on 10/24/2024 demonstrated multilobar bonchopneumonia. Blood cultures on 10/24/2024 positive for MRSA bacteremia. 10/26/2024 had left thoracentesis with 450 mL transudate fluid removed with negative cultures. Transferred to [facility] 11/02/2024 for management of septic shock and IR evaluation for [facility] placement which has not been completed  due to continued pressor needs.   Palliative medicine consulted for goals of care conversation.  Patient transitioned to comfort measures on 11/30/2024 by CCMD. Transferred to hospitalist service on 12/01/2024.   SUMMARY OF RECOMMENDATIONS DNR-comfort Daughter is HCPOA, will have further discussions with her about hospice facility if interested  Symptom Management:  Dilaudid  pain/dyspnea/increased work of breathing/RR>25 Hydroxyzine  first line, Benadryl  second line for PRN itching Robinul  PRN secretions Ativan  PRN anxiety/seizure/sleep/distress Liquifilm Tears PRN dry eye  Code Status: DNR - Comfort  Prognosis: < 2 weeks  Discharge Planning: Anticipated Hospital Death  Discussed with: Girguis MD 12/01/2024 about changing morphine  to dilaudid  due to ESRD and following up with family about possible hospice facility placement if interested.   Subjective:   Subjective: Chart Reviewed. Updates received. Patient Assessed. Created space and opportunity for patient  and family to explore thoughts and feelings regarding current medical situation.  Discussed with primary RN Jun who shares patient had an uneventful night. Only needed atarax  for pruritus. Patient never initiated on morphine  infusion as patient refused and did not need it for pain or dyspnea.   Today's Discussion:  Met with patient at bedside without any visitors present. Patient was resting but awoke to voice. Denied any discomfort besides some pruritus.   Called patient's daughter and HCPOA and left voicemail.   Review of Systems  Skin:        Pruritus  All other systems reviewed and are negative.  Objective:   Primary Diagnoses: Present on Admission:  Multifocal pneumonia  Below-knee amputation of right lower extremity (HCC)  Chronic systolic CHF (congestive heart failure) (HCC)  Type 2 diabetes mellitus with diabetic chronic kidney disease (HCC)  (Resolved) Severe sepsis with septic shock  MRSA bacteremia   Hypotension  CAD (coronary artery disease)  Peripheral vascular disease, unspecified   Vital Signs:  BP (!) 82/40 (BP Location: Right Arm)   Pulse 81   Temp 97.8 F (36.6 C) (Oral)   Resp 16   Ht 6' 1 (1.854 m)   Wt 105.6 kg   SpO2 100%   BMI 30.72 kg/m   Physical Exam HENT:     Head: Normocephalic and atraumatic.  Eyes:     Extraocular Movements: Extraocular movements intact.  Cardiovascular:     Rate and Rhythm: Normal rate.  Pulmonary:     Effort: Pulmonary effort is normal.  Musculoskeletal:     Right lower leg: Edema present.     Left lower leg: Edema present.  Skin:    General: Skin is warm.  Neurological:     General: No focal deficit present.     Mental Status: He is alert.  Psychiatric:        Mood and Affect: Mood normal.    Palliative Assessment/Data: 40%   Existing Vynca/ACP Documentation: HCPOA - Daughter [name]  Thank you for allowing us  to participate in the care of [name] ZEITLIN Sr. PMT will continue to support holistically.  I personally spent a total of 25 minutes in the care of the patient today including preparing to see the patient, performing a medically appropriate exam/evaluation, placing orders, referring and communicating with other health care professionals, and documenting clinical information in the EHR.   Fairy FORBES Shan DEVONNA  Palliative Medicine Team  Team Phone # [phone] (Nights/Weekends) 12/01/2024 2:54 PM  "

## 2024-12-01 NOTE — Consult Note (Signed)
 WOC Nurse Consult Note:  WOC consult performed remotely utilizing imaging and chart review Reason for Consult: wounds on buttocks and inner area near groin  Wound type: Full thickness wounds to Right buttocks and and groin related to moisture associated skin damage Pressure Injury POA: NA- not pressure Measurement: see nursing flow sheets Wound bed: 95% yellow, 5 % red Drainage (amount, consistency, odor) see nursing flow sheets Periwound: intact Dressing procedure/placement/frequency:  R buttocks/groin: cleanse with soap and water and dry thoroughly.  Utilize Desitin in the groin/buttocks area to protect site from moisture.       WOC team will not follow patient at this time, please re consult if new needs arise or wound deteriorates.  Thank you,  Doyal Polite, MSN, RN, [facility] WOC Team [phone] (Available Mon-Fri 0700-1500)

## 2024-12-02 DIAGNOSIS — I2510 Atherosclerotic heart disease of native coronary artery without angina pectoris: Secondary | ICD-10-CM | POA: Diagnosis not present

## 2024-12-02 DIAGNOSIS — Z7189 Other specified counseling: Secondary | ICD-10-CM | POA: Diagnosis not present

## 2024-12-02 DIAGNOSIS — Z66 Do not resuscitate: Secondary | ICD-10-CM | POA: Diagnosis not present

## 2024-12-02 DIAGNOSIS — J188 Other pneumonia, unspecified organism: Secondary | ICD-10-CM | POA: Diagnosis not present

## 2024-12-02 DIAGNOSIS — J189 Pneumonia, unspecified organism: Secondary | ICD-10-CM | POA: Diagnosis not present

## 2024-12-02 DIAGNOSIS — N189 Chronic kidney disease, unspecified: Secondary | ICD-10-CM | POA: Diagnosis not present

## 2024-12-02 DIAGNOSIS — Z515 Encounter for palliative care: Secondary | ICD-10-CM | POA: Diagnosis not present

## 2024-12-02 DIAGNOSIS — E1122 Type 2 diabetes mellitus with diabetic chronic kidney disease: Secondary | ICD-10-CM | POA: Diagnosis not present

## 2024-12-02 DIAGNOSIS — I739 Peripheral vascular disease, unspecified: Secondary | ICD-10-CM | POA: Diagnosis not present

## 2024-12-02 DIAGNOSIS — I5022 Chronic systolic (congestive) heart failure: Secondary | ICD-10-CM | POA: Diagnosis not present

## 2024-12-02 DIAGNOSIS — I959 Hypotension, unspecified: Secondary | ICD-10-CM | POA: Diagnosis not present

## 2024-12-02 NOTE — Plan of Care (Signed)

## 2024-12-02 NOTE — Progress Notes (Signed)
 " Daily Progress Note   Date: 12/02/2024   Patient Name: [name] LONGTON Sr.  DOB: [birth year]  MRN: [id number]  Age / Sex: 64 y.o., male  Attending Physician: Patsy Lenis, MD Primary Care Physician: [name], MD Admit Date: 10/24/2024 Length of Stay: 39 days  Reason for Follow-up: Terminal Care  Past Medical History:  Diagnosis Date   ESRD on hemodialysis (HCC)    a. HD MWF > 10 years.   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 01/2022 Echo: EF 45-50%, glob HK, mod LVH, mildly reduced RV fxn, mild MR, mod-sev MV Ca2+. Mild AS; b. 10/2023 Echo: EF 45-50%, glob HK, GrII DD, mildly reduced RV fxn, RVSP 52.69mmHg. Mild MR/MS, mod TR.   Hypertension    Nonischemic cardiomyopathy (HCC)    a. 2019 Cath [facility]): Nonobstructive disease; b. 12/2019 Echo: EF 50%; c. 12/2019 MV (Tx w/u [facility]): Ant infarct w/ peri-infarct ischemia. EF 35%; c. 05/2021 MV (Tx w/u [facility]): Cor Ca2+, EF 53%, motion artifact, equiv study, likely low risk.   Nonobstructive coronary artery disease    a.  2019 Cath ([place] [facility]): Left main normal, LAD 30 proximal, RCA 30 distal, EF 25%.   Obesity    Peripheral arterial disease    Peripheral vascular disease    Type 2 diabetes mellitus (HCC)    a. 10/2023 - admission w/ HHS.    Assessment & Plan:   HPI/Patient Profile:   64 y.o. male  with past medical history of ESRD on HD, HFrEF (20-25% EF), T2DM, atrial fibrillation admitted on 10/24/2024 with MRSA bacteremia and AHRF 2/2 pneumonia. Per H&P on 10/24/2024 by Pearlean MD, patient presented to [facility] for 2-3 weeks of shortness of breath. CT chest on 10/24/2024 demonstrated multilobar bonchopneumonia. Blood cultures on 10/24/2024 positive for MRSA bacteremia. 10/26/2024 had left thoracentesis with 450 mL transudate fluid removed with negative cultures. Transferred to [facility] 11/02/2024 for management of septic shock and IR evaluation for [facility] placement which has not been completed due to  continued pressor needs.   Palliative medicine consulted for goals of care conversation.   Patient transitioned to comfort measures on 11/30/2024 by CCMD. Transferred to hospitalist service on 12/01/2024. Patient did not have any need for symptom management in the last 24 hours prior to visit.   SUMMARY OF RECOMMENDATIONS DNR-comfort measures Continue to monitor for symptoms and anticipated hospital death vs transfer to inpatient hospice facility Palliative medicine will continue to follow  Symptom Management:  Dilaudid  pain/dyspnea/increased work of breathing/RR>25 Hydroxyzine  first line, Benadryl  second line for PRN itching Robinul  PRN secretions Ativan  PRN anxiety/seizure/sleep/distress Liquifilm Tears PRN dry eye  Code Status: DNR - Comfort  Prognosis: < 2 weeks  Discharge Planning: Anticipated Hospital Death  Subjective:   Subjective: Chart Reviewed. Updates received. Patient Assessed. Created space and opportunity for patient  and family to explore thoughts and feelings regarding current medical situation.  Today's Discussion:  Met with patient at bedside without any visitors present. Patient did report pruritus and some pain in his sacral region. Otherwise patient did not have any other complaints at this time.   Spoke on phone with the patient's daughter and HCPOA and updated her. Daughter shared that the initial impression from the medical team that when he transitioned to comfort measures she anticipated to pass quickly - within hours of discontinuing aggressive interventions. I explained to her that it is always difficult to tell when someone will pass, the medical team was worried since  the patient was on 3 pressors at the time of discontinuing aggressive measures. Shared with her that I still expect him to not have much time left considering that we have stopped dialysis and he may have days to weeks left to live. Overall patient remains comfortable and variable PO intake  still at this time. Discussed possibly transferring patient to a hospice facility if family was interested and they were unsure at this time. They want further discussions with other family members.   Review of Systems  Skin:  Positive for rash.  All other systems reviewed and are negative.   Objective:   Primary Diagnoses: Present on Admission:  Multifocal pneumonia  Below-knee amputation of right lower extremity (HCC)  Chronic systolic CHF (congestive heart failure) (HCC)  Type 2 diabetes mellitus with diabetic chronic kidney disease (HCC)  (Resolved) Severe sepsis with septic shock  MRSA bacteremia  Hypotension  CAD (coronary artery disease)  Peripheral vascular disease, unspecified   Vital Signs:  BP (!) 102/31 (BP Location: Left Arm)   Pulse 81   Temp 97.7 F (36.5 C)   Resp 15   Ht 6' 1 (1.854 m)   Wt 105.6 kg   SpO2 100%   BMI 30.72 kg/m   Physical Exam HENT:     Head: Normocephalic and atraumatic.  Eyes:     Extraocular Movements: Extraocular movements intact.  Cardiovascular:     Rate and Rhythm: Normal rate.  Pulmonary:     Effort: Pulmonary effort is normal.  Skin:    General: Skin is warm and dry.  Neurological:     General: No focal deficit present.     Mental Status: He is alert.    Palliative Assessment/Data: 40%   Existing Vynca/ACP Documentation: HCPOA - Daughter Amber [name] Pounds  Thank you for allowing us  to participate in the care of [name] MCFADYEN Sr. PMT will continue to support holistically.  I personally spent a total of 35 minutes in the care of the patient today including preparing to see the patient, getting/reviewing separately obtained history, counseling and educating, and documenting clinical information in the EHR.  Fairy FORBES Shan DEVONNA  Palliative Medicine Team  Team Phone # [phone] (Nights/Weekends) 12/02/2024 3:53 PM  "

## 2024-12-02 NOTE — Progress Notes (Signed)
 " Progress Note    [name] INNIS Sr.   FMW:[id number]  DOB: [birth year]  DOA: 10/24/2024     40 PCP: [name], MD  Initial CC: Dyspnea  Hospital Course: [name] [name] is a 64 y.o. male who has a PMH as outlined below including but not limited to ESRD on HD MWF, A-fib, systolic CHF, hypertension, LBBB, DM.  He presented to [facility] ED on 10/24/2024 with dyspnea x roughly 2-3 weeks. He had been seen in the ED a few days early on 12/2 for dysuria and was started on a course of Cipro  which she started the following day and stated that he had been taking as prescribed.     CT chest that demonstrated multifocal pneumonia and small effusions.  Blood cultures ultimately grew MRSA.  He had been on vancomycin  which was continued. He had a left thora 12/10 with 450cc fluid removed.  His HD catheter was removed on 10/28/2024 as well as an arterial line.  He did have a TTE on 12/11 that showed an EF of 20-25%, G2 DD, mild decrease in RV SF, severe AAS, no vegetations.  TEE was recommended but had been deferred due to patient requiring vasopressor support.  ID was consulted and recommended line holiday.  He had a temporary femoral HD cath placed on 12/15.  ID recommendations included 6 weeks of vancomycin  to end on 12/09/2024.    12/17, he was transferred to [facility] for ongoing management for septic shock as well as for IR evaluation for tunneled HD catheter placement  Significant Hospital Events:  12/8 admit 12/10 L thora with 450cc removed, transudate, neg cultures 12/11 echo 12/12 HD cath and art line removed 12/15 new femoral HD cath placed 12/17 transfer to [facility] 87M 12/19 HD  12/20 CRRT 12/23 Weaned off levophed  this am. HD trial this am. Transferred to [facility] 12/25 Returned to ICU for hypotension 12/28-remains on Levophed  12/29-remains on Levophed , tolerating dialysis 12/31 was off Levophed  this morning 1/2 on Levophed  and had HD 1/4 changed to neo and vaso, stopped levophed  1/5  HD 1/6 CRRT and GOC discussion w/ pt's daughter  1/14 CRRT discontinued. Plan to transition to comfort care   Resolved problem list  Septic shock Multifocal pneumonia - s/p antibiotics (zosyn  x 4d and additional course of ceftriaxone  x 5 days) Acute hypoxemic respiratory failure, resolved   Assessment and Plan    GOC Palliative following. Discussion w/ pt and pt's daughter who is POA. PMT and CCM expressed concern to both how pt may not tolerate HD outpatient. See 1/6 IPAL note.  -Patient states will follow daughter's decision.  -He is aware that if he is unable to tolerate HD then he will die.  -Family at bedside. Discussed process to transition to comfort care. Addressed questions and concerns. Transitioning to comfort care 11/30/24. CRRT discontinued and vasopressor weaned off  Chronic hypotension requiring vasopressor support No evidence of worsening infection driving hypotension.  Not in heart failure.  Suspect related to poor cardiac function in setting of renal disease - Has been trialed on midodrine  and Strattera  which have now been discontinued in setting of comfort care - s/p CRRT with vasopressor dependence; now being transitioned off dialysis and vasopressor support to comfort care   MRSA bacteremia - s/p vanc; was to complete extended course to 1/23, but stopped in setting of comfort care - Unable to complete TEE at this time - 12/12 Bcx negative, 12/18 Bcx negative - 1/5 Bcx NGTD  Elevated LFTs Elevated Bili Slowly improving with stable bilirubin. Denies abd pain and NT on abd exam. Afebrile. UQ US  w/ single mobile gallstone.  - continue comfort care    Afib w/ RVR - Currently rate controled - continue comfort care    Heart failure with reduced ejection fraction of 20 to 25% Severe aortic stenosis - Not on GDMT due to hypotension  - continue comfort care   End-stage renal disease on hemodialysis Hypervolemic with 3rd spacing - continue comfort care  - No  further dialysis   Type 2 diabetes CBG goal 140-180. Poor appetite so insulin  d/c.  - continue comfort care    Peripheral vascular disease - Aspirin  discontinued in setting of comfort care   Depressed mood Poor appetite - continue comfort care  Interval History:  No events overnight Eating well this am and is comfortable. Mild delayed cognition but brief. Some asterixis noted but very mild.   Consultants:  Palliative care   Code Status:   Code Status: Do not attempt resuscitation (DNR) - Comfort care  Barriers to discharge: None Therapy evaluation: PT Orders:   PT Follow up Rec: Skilled Nursing-Short Term Rehab (<3 Hours/Day)11/25/2024 1500  Disposition Plan: Inpatient comfort care Status is: DNR  Mobility Assessment (Last 72 Hours)     Mobility Assessment     Row Name 12/02/24 0850 12/01/24 2000 12/01/24 0750 12/01/24 0130 11/30/24 0745   Does the patient have exclusion criteria? No- Perform mobility assessment Yes- Bedfast (Level 1) - Select exclusion criteria in next row No- Perform mobility assessment Yes- Bedfast (Level 1) - Select exclusion criteria in next row Yes- Bedfast (Level 1) - Select exclusion criteria in next row   Mobility Assessment Exclusion Criteria No exclusion criteria present, perform mobility assessment -- No exclusion criteria present, perform mobility assessment No exclusion criteria present, perform mobility assessment No exclusion criteria present, perform mobility assessment   What is the highest level of mobility based on the mobility assessment? Level 1 (Bedfast) - Unable to balance while sitting on edge of bed Level 1 (Bedfast) - Unable to balance while sitting on edge of bed Level 1 (Bedfast) - Unable to balance while sitting on edge of bed Level 1 (Bedfast) - Unable to balance while sitting on edge of bed Level 1 (Bedfast) - Unable to balance while sitting on edge of bed   Is the above level different from baseline mobility prior to current  illness? Yes - Recommend PT order Yes - Recommend PT order Yes - Recommend PT order Yes - Recommend PT order Yes - Recommend PT order    Row Name 11/29/24 2000           Does the patient have exclusion criteria? No- Perform mobility assessment       What is the highest level of mobility based on the mobility assessment? Level 1 (Bedfast) - Unable to balance while sitting on edge of bed          Diet: Diet Orders (From admission, onward)     Start     Ordered   12/01/24 0919  Diet regular Room service appropriate? Yes with Assist; Fluid consistency: Thin  Diet effective now       Question Answer Comment  Room service appropriate? Yes with Assist   Fluid consistency: Thin      12/01/24 0918            Objective: Blood pressure (!) 102/31, pulse 81, temperature 97.7 F (36.5 C), resp. rate 15,  height 6' 1 (1.854 m), weight 105.6 kg, SpO2 100%.  Examination:  Physical Exam Constitutional:      Appearance: Normal appearance.     Comments: Resting in bed in no distress significantly lethargic  HENT:     Head: Normocephalic and atraumatic.     Mouth/Throat:     Mouth: Mucous membranes are moist.  Eyes:     Extraocular Movements: Extraocular movements intact.  Cardiovascular:     Rate and Rhythm: Normal rate and regular rhythm.  Pulmonary:     Effort: Pulmonary effort is normal. No respiratory distress.     Breath sounds: Normal breath sounds. No wheezing.  Abdominal:     General: Bowel sounds are normal. There is no distension.     Palpations: Abdomen is soft.     Tenderness: There is no abdominal tenderness.  Musculoskeletal:        General: Normal range of motion.     Cervical back: Normal range of motion and neck supple.  Skin:    General: Skin is warm and dry.  Neurological:     Comments: Able to squeeze fingers on command and wiggle toes.  Significant weakness noted.  Very mild asterixis appreciated in bilateral hands      Data Reviewed: No results found  for this or any previous visit (from the past 24 hours).  I have reviewed pertinent nursing notes, vitals, labs, and images as necessary. I have ordered labwork to follow up on as indicated.  I have reviewed the last notes from staff over past 24 hours. I have discussed patient's care plan and test results with nursing staff, CM/SW, and other staff as appropriate.  Old records reviewed in assessment of this patient  Time spent: Greater than 50% of the 55 minute visit was spent in counseling/coordination of care for the patient as laid out in the A&P.   LOS: 39 days   Alm Apo, MD [facility] 12/02/2024, 12:49 PM "

## 2024-12-03 DIAGNOSIS — Z7189 Other specified counseling: Secondary | ICD-10-CM | POA: Diagnosis not present

## 2024-12-03 DIAGNOSIS — Z515 Encounter for palliative care: Secondary | ICD-10-CM | POA: Diagnosis not present

## 2024-12-03 DIAGNOSIS — J188 Other pneumonia, unspecified organism: Secondary | ICD-10-CM | POA: Diagnosis not present

## 2024-12-03 MED ORDER — HYDROMORPHONE HCL 2 MG PO TABS
1.0000 mg | ORAL_TABLET | ORAL | Status: DC | PRN
  Administered 2024-12-03 – 2024-12-08 (×3): 1 mg via ORAL
  Filled 2024-12-03 (×3): qty 1

## 2024-12-03 NOTE — Progress Notes (Addendum)
 "                                                                                                                                                                                                         Daily Progress Note   Patient Name: [name] ROSKO Sr.       Date: 12/03/2024 DOB: [birth year]  Age: 64 y.o. MRN#: [id number] Attending Physician: Patsy Lenis, MD Primary Care Physician: [name], MD Admit Date: 10/24/2024  Reason for Consultation/Follow-up: Non pain symptom management, Pain control, Psychosocial/spiritual support, and Terminal Care  Subjective: I have reviewed medical records including: EPIC notes: Hospitalist, PMT, nursing, nephrology, dietitian, CCM.  CRRT was discontinued and patient transitioned to full comfort care on 1/14. MAR: Comfort medications per MAR.  As needed medications administered in the last 24 hours-Benadryl  x 1, hydroxyzine  x 2, oxycodone  x 2 Available advanced directives in ACP: HCPOA - [employer] personally reviewed 1/17: Creatinine 1.78 assessed for opioid prescribing  Received report from primary RN -no acute concerns.  Went to visit patient at bedside -daughter/[name] [name] present.  Patient was lying in bed awake and able to participate in conversation.  Signs/non-verbal gestures of pain noted -grimacing . No respiratory distress, increased work of breathing, or secretions noted.  Patient endorses pain in his sacral area.   Emotional support provided to patient and [name] [name].  Education provided on the importance of repositioning to assist with pain and discomfort in the sacral area.  Encouraged the use of pillows and turning.  [name] [name] questions if there is a wedge pillow available - will order.  Will also order air mattress to help with pressure relief.  [name] [name] has noticed swelling to lower extremity. +2 pitting edema noted on exam.  Education provided on anticipated side effects after stopping CRRT and natural trajectory at end-of-life, to include  swelling and possible shortness of breath.  [name] [name] notes that patient has experienced intermittent mild shortness of breath.  Discussed symptom management plan and encourage patient/family to notify nursing staff of any concerning symptoms.  Discussed use of opioids for pain and shortness of breath.  Patient and [name] [name] expressed understanding. Will adjust dilaudid  from every 4 hours as needed to every 2 hours as needed in case shortness of breath worsens.  Primary NT notified of patient's desire for bath today.  Discussed possible hospice transfer with patient and [name] [name].  Per [name] [name]'s request, reviewed that there is a hospice facility in [place], which would be closer to other family.  Allowed space and time for them to discuss information regarding inpatient hospice  facility.  After discussion, they are not interested in transfer.  They wish for patient to remain in house for end-of-life care.  All questions and concerns addressed. Encouraged to call with questions and/or concerns. PMT card provided.  Length of Stay: 40  Current Medications: Scheduled Meds:   Chlorhexidine  Gluconate Cloth  6 each Topical Daily   liver oil-zinc  oxide   Topical BID   oxymetazoline   1 spray Each Nare BID    Continuous Infusions:   PRN Meds: acetaminophen  **OR** acetaminophen , artificial tears, diphenhydrAMINE , glycopyrrolate  **OR** glycopyrrolate  **OR** glycopyrrolate , guaiFENesin -dextromethorphan , HYDROmorphone , hydrOXYzine , ipratropium-albuterol , LORazepam , mouth rinse, oxyCODONE , phenol, polyethylene glycol, sodium chloride  flush, trimethobenzamide   Physical Exam Vitals and nursing note reviewed.  Constitutional:      General: He is not in acute distress.    Appearance: He is obese.  Pulmonary:     Effort: Pulmonary effort is normal. No respiratory distress.  Skin:    General: Skin is warm and dry.  Neurological:     Mental Status: He is alert.     Motor: Weakness present.  Psychiatric:         Attention and Perception: Attention normal.        Behavior: Behavior is cooperative.             Vital Signs: BP (!) 80/41 (BP Location: Right Arm)   Pulse 84   Temp 98.5 F (36.9 C) (Oral)   Resp 15   Ht 6' 1 (1.854 m)   Wt 105.6 kg   SpO2 100%   BMI 30.72 kg/m  SpO2: SpO2: 100 % O2 Device: O2 Device: Room Air O2 Flow Rate: O2 Flow Rate (L/min): 2 L/min  Intake/output summary:  Intake/Output Summary (Last 24 hours) at 12/03/2024 1112 Last data filed at 12/02/2024 1200 Gross per 24 hour  Intake 250 ml  Output --  Net 250 ml   LBM: Last BM Date : 12/01/24 Baseline Weight: Weight: 104.3 kg Most recent weight: Weight: 105.6 kg       Palliative Assessment/Data: PPS 30%      Patient Active Problem List   Diagnosis Date Noted   End of life care 12/01/2024   Hemodialysis catheter infection 10/27/2024   Sepsis due to methicillin resistant Staphylococcus aureus (MRSA) (HCC) 10/26/2024   Pleural effusion 10/26/2024   End stage renal disease (HCC) 10/26/2024   Multifocal pneumonia 10/24/2024   Gross hematuria 06/07/2024   Dependence on renal dialysis 06/06/2024   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   Late effects of cerebrovascular disease 04/22/2024   Old myocardial infarction 04/22/2024   Uncontrolled diabetes mellitus with hyperglycemia (HCC) 02/11/2024   Orthostatic hypotension 01/12/2024   Iron  deficiency anemia 12/01/2023   Ischemic colitis 11/27/2023   Bloody diarrhea 11/27/2023   Heme positive stool 11/25/2023   Aortic valve stenosis 11/15/2023   Atrial flutter with rapid ventricular response (HCC) 11/10/2023   Repeated falls 10/29/2023   Hyperglycemia 10/24/2023   Disorder of bilirubin metabolism, unspecified 11/20/2022   RUQ abdominal pain 10/30/2022   Cholelithiasis 10/29/2022   Hyperbilirubinemia 10/29/2022   Cellulitis of left lower limb 07/18/2022   Hyperosmolar hyperglycemic state (HHS) (HCC)    Diarrhea    Dehiscence of amputation  stump of right lower extremity (HCC)    Abscess of right lower leg    Pressure injury of skin 07/02/2022   Cellulitis of right leg 07/01/2022   CAD (coronary artery disease) 05/24/2022   Leukocytosis 05/24/2022   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/23/2022   Goals  of care, counseling/discussion 04/10/2022   Obesity (BMI 30-39.9) 02/19/2022   Hypotension 02/08/2022   History of CVA  02/05/2022   Carotid stenosis 02/05/2022   MRSA bacteremia    Hyperkalemia 02/03/2022   Subacute osteomyelitis, right ankle and foot (HCC) 02/02/2022   Protein-calorie malnutrition, mild 02/02/2022   GERD (gastroesophageal reflux disease) 02/02/2022   LBBB (left bundle branch block) 08/13/2019   Problem with vascular access 08/13/2019   Dialysis AV fistula malfunction 08/12/2019   ESRD on dialysis (HCC) 09/20/2018   DM2 (diabetes mellitus, type 2) (HCC) 09/20/2018   HTN (hypertension) 09/20/2018   Hypothyroidism, unspecified 07/27/2018   Secondary hyperparathyroidism of renal origin 07/27/2018   Anemia in chronic kidney disease 07/26/2018   Gout, unspecified 07/23/2018   Type 2 diabetes mellitus with diabetic chronic kidney disease (HCC) 07/23/2018   Chronic systolic CHF (congestive heart failure) (HCC) 07/23/2018   Peripheral vascular disease, unspecified 07/23/2018   Insulin  dependent type 2 diabetes mellitus (HCC) 05/14/2018   Nonischemic cardiomyopathy (HCC) 03/17/2018   Essential hypertension 03/17/2018    Palliative Care Assessment & Plan   Patient Profile: 64 y.o. male  with past medical history of ESRD on HD, HFrEF (20-25% EF), T2DM, atrial fibrillation admitted on 10/24/2024 with MRSA bacteremia and AHRF 2/2 pneumonia. Per H&P on 10/24/2024 by Pearlean MD, patient presented to [facility] for 2-3 weeks of shortness of breath. CT chest on 10/24/2024 demonstrated multilobar bonchopneumonia. Blood cultures on 10/24/2024 positive for MRSA bacteremia. 10/26/2024 had left  thoracentesis with 450 mL transudate fluid removed with negative cultures. Transferred to [facility] 11/02/2024 for management of septic shock and IR evaluation for [facility] placement which has not been completed due to continued pressor needs.   Palliative medicine consulted for goals of care conversation.   Patient transitioned to comfort measures on 11/30/2024 by CCMD. Transferred to hospitalist service on 12/01/2024.  Excuse me  Assessment: Principal Problem:   Multifocal pneumonia Active Problems:   DM2 (diabetes mellitus, type 2) (HCC)   MRSA bacteremia   History of CVA    Hypotension   Goals of care, counseling/discussion   Below-knee amputation of right lower extremity (HCC)   CAD (coronary artery disease)   Type 2 diabetes mellitus with diabetic chronic kidney disease (HCC)   Chronic systolic CHF (congestive heart failure) (HCC)   Peripheral vascular disease, unspecified   Sepsis due to methicillin resistant Staphylococcus aureus (MRSA) (HCC)   Pleural effusion   End stage renal disease (HCC)   Hemodialysis catheter infection   End of life care   Recommendations/Plan: Continue full comfort measures Continue DNR/DNI as previously documented Patient/family not interested in hospice transfer - anticipate hospital death Continue current comfort focused medication regimen as noted below - changes noted in bold Continue palliative wound care Ordered air mattress and wedge pillow for sacral pressure relief Adjusted orders to reflect full comfort measures, as well as discontinued orders not focused on comfort PMT will follow up Tuesday 1/20.  If there are any imminent needs please call the service directly  Symptom Management Adjusted dilaudid  from every 4 hours as needed to every 2 hours as needed for  pain/dyspnea/increased work of breathing/RR>25 Tylenol  PRN pain/fever Biotin twice daily Benadryl  PRN itching Robinul  PRN secretions Haldol PRN agitation/delirium Ativan  PRN  anxiety/seizure/sleep/distress Zofran  PRN nausea/vomiting Liquifilm Tears PRN dry eye   Goals of Care and Additional Recommendations: Limitations on Scope of Treatment: Full Comfort Care  Code Status:    Code Status Orders  (From admission, onward)  Start     Ordered   11/30/24 1047  Do not attempt resuscitation (DNR) - Comfort care  Continuous       Question Answer Comment  If patient has no pulse and is not breathing Do Not Attempt Resuscitation   In Pre-Arrest Conditions (Patient Is Breathing and Has a Pulse) Provide comfort measures. Relieve any mechanical airway obstruction. Avoid transfer unless required for comfort.   Consent: Discussion documented in EHR or advanced directives reviewed      11/30/24 1046           Code Status History     Date Active Date Inactive Code Status Order ID Comments User Context   11/29/2024 1132 11/30/2024 1046 Limited: Do not attempt resuscitation (DNR) -DNR-LIMITED -Do Not Intubate/DNI  [id number]  Kassie Acquanetta Bradley, MD Inpatient   10/24/2024 1632 11/29/2024 1132 Full Code [id number]  Pearlean Tully BRAVO, MD Inpatient   02/11/2024 1818 02/17/2024 2221 Full Code [id number]  [name], Brayton RAMAN, MD ED   01/12/2024 1649 01/13/2024 1934 Full Code [id number]  Claudene Maximino LABOR, MD ED   11/09/2023 1338 12/15/2023 1856 Full Code [id number]  Arrien, Elidia Sieving, MD ED   10/24/2023 0607 10/28/2023 1506 Full Code [id number]  Alfornia Madison, MD ED   10/29/2022 1950 11/09/2022 0057 Full Code [id number]  Ricky Alfrieda DASEN, DO ED   10/18/2022 2250 10/21/2022 0114 Full Code [id number]  Laurita Cort DASEN, MD Inpatient   07/01/2022 1521 07/09/2022 0051 Full Code [id number]  Harlow Ozell BRAVO, MD ED   05/23/2022 1055 05/26/2022 1915 Full Code [id number]  Harden Jerona GAILS, MD Inpatient   02/03/2022 0145 02/20/2022 2147 Full Code [id number]  Ilah Corean HERO, PA-C Inpatient   02/02/2022 0715 02/03/2022 0145 Full Code [id number]  Zierle-Ghosh, Asia B, DO ED   08/12/2019 2217  08/16/2019 1346 Full Code [id number]  Alfornia Madison, MD ED   09/20/2018 2250 09/24/2018 2218 Full Code [id number]  Lonzell Emeline HERO, DO ED       Prognosis:  < 2 weeks  Discharge Planning: Anticipated Hospital Death  Care plan was discussed with primary RN, patient, patient's daughter, Dr. Patsy  Thank you for allowing the Palliative Medicine Team to assist in the care of this patient.  Billing based on MDM: High  Problems Addressed: One acute or chronic illness or injury that poses a threat to life or bodily function  Amount and/or Complexity of Data: Category 1:Review of prior external note(s) from each unique source, Review of the result(s) of each unique test, and Assessment requiring an independent historian(s) and Category 3:Discussion of management or test interpretation with external physician/other qualified health care professional/appropriate source (not separately reported)  Risks: Parenteral controlled substances     Javid Kemler HERO Claudene, NP  Please contact Palliative Medicine Team phone at [phone] for questions and concerns.       "

## 2024-12-03 NOTE — Progress Notes (Signed)
 " Progress Note    [name] SARIN Sr.   FMW:[id number]  DOB: Sep 17, 1961  DOA: 10/24/2024     40 PCP: [name], MD  Initial CC: Dyspnea  Hospital Course: [name] [name] is a 64 y.o. male who has a PMH as outlined below including but not limited to ESRD on HD MWF, A-fib, systolic CHF, hypertension, LBBB, DM.  He presented to [facility] ED on 10/24/2024 with dyspnea x roughly 2-3 weeks. He had been seen in the ED a few days early on 12/2 for dysuria and was started on a course of Cipro  which she started the following day and stated that he had been taking as prescribed.     CT chest that demonstrated multifocal pneumonia and small effusions.  Blood cultures ultimately grew MRSA.  He had been on vancomycin  which was continued. He had a left thora 12/10 with 450cc fluid removed.  His HD catheter was removed on 10/28/2024 as well as an arterial line.  He did have a TTE on 12/11 that showed an EF of 20-25%, G2 DD, mild decrease in RV SF, severe AAS, no vegetations.  TEE was recommended but had been deferred due to patient requiring vasopressor support.  ID was consulted and recommended line holiday.  He had a temporary femoral HD cath placed on 12/15.  ID recommendations included 6 weeks of vancomycin  to end on 12/09/2024.    12/17, he was transferred to [facility] for ongoing management for septic shock as well as for IR evaluation for tunneled HD catheter placement  Significant Hospital Events:  12/8 admit 12/10 L thora with 450cc removed, transudate, neg cultures 12/11 echo 12/12 HD cath and art line removed 12/15 new femoral HD cath placed 12/17 transfer to [facility] 48M 12/19 HD  12/20 CRRT 12/23 Weaned off levophed  this am. HD trial this am. Transferred to [facility] 12/25 Returned to ICU for hypotension 12/28-remains on Levophed  12/29-remains on Levophed , tolerating dialysis 12/31 was off Levophed  this morning 1/2 on Levophed  and had HD 1/4 changed to neo and vaso, stopped levophed  1/5  HD 1/6 CRRT and GOC discussion w/ pt's daughter  1/14 CRRT discontinued. Plan to transition to comfort care   Resolved problem list  Septic shock Multifocal pneumonia - s/p antibiotics (zosyn  x 4d and additional course of ceftriaxone  x 5 days) Acute hypoxemic respiratory failure, resolved   Assessment and Plan    [facility] Palliative following. Discussion w/ pt and pt's daughter who is POA. PMT and CCM expressed concern to both how pt may not tolerate HD outpatient. See 1/6 IPAL note -Patient states will follow daughter's decision -He is aware that if he is unable to tolerate HD then he will die -Family at bedside. Discussed process to transition to comfort care. Addressed questions and concerns. Transitioning to comfort care 11/30/24. CRRT discontinued and vasopressor weaned off  Chronic hypotension requiring vasopressor support No evidence of worsening infection driving hypotension.  Not in heart failure.  Suspect related to poor cardiac function in setting of renal disease - Has been trialed on midodrine  and Strattera  which have now been discontinued in setting of comfort care - s/p CRRT with vasopressor dependence; now being transitioned off dialysis and vasopressor support to comfort care   MRSA bacteremia - s/p vanc; was to complete extended course to 1/23, but stopped in setting of comfort care - Unable to complete TEE at this time - 12/12 Bcx negative, 12/18 Bcx negative - 1/5 Bcx NGTD   Elevated LFTs Elevated  Bili Slowly improving with stable bilirubin. Denies abd pain and NT on abd exam. Afebrile. UQ US  w/ single mobile gallstone.  - continue comfort care    Afib w/ RVR - Currently rate controled - continue comfort care    Heart failure with reduced ejection fraction of 20 to 25% Severe aortic stenosis - Not on GDMT due to hypotension  - continue comfort care   End-stage renal disease on hemodialysis Hypervolemic with 3rd spacing - continue comfort care  - No further  dialysis   Type 2 diabetes CBG goal 140-180. Poor appetite so insulin  d/c.  - continue comfort care    Peripheral vascular disease - Aspirin  discontinued in setting of comfort care   Depressed mood Poor appetite - continue comfort care  Interval History:  No events overnight Called and updated Amber this afternoon.  Plan is for continuing to manage him in the hospital for now. He remains comfortable.  Ate breakfast.  Mentation still preserved.   Consultants:  Palliative care   Code Status:   Code Status: Do not attempt resuscitation (DNR) - Comfort care  Barriers to discharge: None Therapy evaluation: PT Orders:   PT Follow up Rec: Skilled Nursing-Short Term Rehab (<3 Hours/Day)11/25/2024 1500  Disposition Plan: Inpatient comfort care Status is: DNR  Mobility Assessment (Last 72 Hours)     Mobility Assessment     Row Name 12/02/24 2244 12/02/24 0850 12/01/24 2000 12/01/24 0750 12/01/24 0130   Does the patient have exclusion criteria? Yes- Bedfast (Level 1) - Select exclusion criteria in next row No- Perform mobility assessment Yes- Bedfast (Level 1) - Select exclusion criteria in next row No- Perform mobility assessment Yes- Bedfast (Level 1) - Select exclusion criteria in next row   Mobility Assessment Exclusion Criteria No exclusion criteria present, perform mobility assessment No exclusion criteria present, perform mobility assessment -- No exclusion criteria present, perform mobility assessment No exclusion criteria present, perform mobility assessment   What is the highest level of mobility based on the mobility assessment? Level 1 (Bedfast) - Unable to balance while sitting on edge of bed Level 1 (Bedfast) - Unable to balance while sitting on edge of bed Level 1 (Bedfast) - Unable to balance while sitting on edge of bed Level 1 (Bedfast) - Unable to balance while sitting on edge of bed Level 1 (Bedfast) - Unable to balance while sitting on edge of bed   Is the above level  different from baseline mobility prior to current illness? No - Consider discontinuing PT/OT Yes - Recommend PT order Yes - Recommend PT order Yes - Recommend PT order Yes - Recommend PT order      Diet: Diet Orders (From admission, onward)     Start     Ordered   12/01/24 0919  Diet regular Room service appropriate? Yes with Assist; Fluid consistency: Thin  Diet effective now       Question Answer Comment  Room service appropriate? Yes with Assist   Fluid consistency: Thin      12/01/24 0918            Objective: Blood pressure (!) 80/41, pulse 84, temperature 98.5 F (36.9 C), temperature source Oral, resp. rate 15, height 6' 1 (1.854 m), weight 105.6 kg, SpO2 100%.  Examination:  Physical Exam Constitutional:      Appearance: Normal appearance.     Comments: Resting in bed in no distress significantly lethargic  HENT:     Head: Normocephalic and atraumatic.  Mouth/Throat:     Mouth: Mucous membranes are moist.  Eyes:     Extraocular Movements: Extraocular movements intact.  Cardiovascular:     Rate and Rhythm: Normal rate and regular rhythm.  Pulmonary:     Effort: Pulmonary effort is normal. No respiratory distress.     Breath sounds: Normal breath sounds. No wheezing.  Abdominal:     General: Bowel sounds are normal. There is no distension.     Palpations: Abdomen is soft.     Tenderness: There is no abdominal tenderness.  Musculoskeletal:        General: Normal range of motion.     Cervical back: Normal range of motion and neck supple.  Skin:    General: Skin is warm and dry.  Neurological:     Comments: Able to squeeze fingers on command and wiggle toes.  Significant weakness noted.  Very mild asterixis appreciated in bilateral hands      Data Reviewed: No results found for this or any previous visit (from the past 24 hours).  I have reviewed pertinent nursing notes, vitals, labs, and images as necessary. I have ordered labwork to follow up on as  indicated.  I have reviewed the last notes from staff over past 24 hours. I have discussed patient's care plan and test results with nursing staff, CM/SW, and other staff as appropriate.  Old records reviewed in assessment of this patient  Time spent: Greater than 50% of the 55 minute visit was spent in counseling/coordination of care for the patient as laid out in the A&P.   LOS: 40 days   Alm Apo, MD [facility] 12/03/2024, 1:44 PM "

## 2024-12-03 NOTE — Plan of Care (Signed)
 " Problem: Education: Goal: Knowledge of General Education information will improve Description: Including pain rating scale, medication(s)/side effects and non-pharmacologic comfort measures Outcome: Progressing   Problem: Health Behavior/Discharge Planning: Goal: Ability to manage health-related needs will improve Outcome: Progressing   Problem: Clinical Measurements: Goal: Ability to maintain clinical measurements within normal limits will improve Outcome: Progressing Goal: Will remain free from infection Outcome: Progressing Goal: Diagnostic test results will improve Outcome: Progressing Goal: Respiratory complications will improve Outcome: Progressing Goal: Cardiovascular complication will be avoided Outcome: Progressing   Problem: Activity: Goal: Risk for activity intolerance will decrease Outcome: Progressing   Problem: Nutrition: Goal: Adequate nutrition will be maintained Outcome: Progressing   Problem: Elimination: Goal: Will not experience complications related to bowel motility Outcome: Progressing   Problem: Pain Managment: Goal: General experience of comfort will improve and/or be controlled Outcome: Progressing   Problem: Safety: Goal: Ability to remain free from injury will improve Outcome: Progressing   Problem: Skin Integrity: Goal: Risk for impaired skin integrity will decrease Outcome: Progressing   Problem: Activity: Goal: Ability to tolerate increased activity will improve Outcome: Progressing   Problem: Clinical Measurements: Goal: Ability to maintain a body temperature in the normal range will improve Outcome: Progressing   Problem: Respiratory: Goal: Ability to maintain adequate ventilation will improve Outcome: Progressing Goal: Ability to maintain a clear airway will improve Outcome: Progressing   Problem: Education: Goal: Ability to describe self-care measures that may prevent or decrease complications (Diabetes Survival Skills  Education) will improve Outcome: Progressing Goal: Individualized Educational Video(s) Outcome: Progressing   Problem: Coping: Goal: Ability to adjust to condition or change in health will improve Outcome: Progressing   Problem: Fluid Volume: Goal: Ability to maintain a balanced intake and output will improve Outcome: Progressing   Problem: Health Behavior/Discharge Planning: Goal: Ability to identify and utilize available resources and services will improve Outcome: Progressing Goal: Ability to manage health-related needs will improve Outcome: Progressing   Problem: Metabolic: Goal: Ability to maintain appropriate glucose levels will improve Outcome: Progressing   Problem: Nutritional: Goal: Maintenance of adequate nutrition will improve Outcome: Progressing Goal: Progress toward achieving an optimal weight will improve Outcome: Progressing   Problem: Skin Integrity: Goal: Risk for impaired skin integrity will decrease Outcome: Progressing   Problem: Tissue Perfusion: Goal: Adequacy of tissue perfusion will improve Outcome: Progressing   Problem: Education: Goal: Knowledge of disease and its progression will improve Outcome: Progressing Goal: Individualized Educational Video(s) Outcome: Progressing   Problem: Fluid Volume: Goal: Compliance with measures to maintain balanced fluid volume will improve Outcome: Progressing   Problem: Health Behavior/Discharge Planning: Goal: Ability to manage health-related needs will improve Outcome: Progressing   Problem: Nutritional: Goal: Ability to make healthy dietary choices will improve Outcome: Progressing   Problem: Clinical Measurements: Goal: Complications related to the disease process, condition or treatment will be avoided or minimized Outcome: Progressing   Problem: Education: Goal: Knowledge of disease or condition will improve Outcome: Progressing Goal: Understanding of medication regimen will  improve Outcome: Progressing Goal: Individualized Educational Video(s) Outcome: Progressing   Problem: Activity: Goal: Ability to tolerate increased activity will improve Outcome: Progressing   Problem: Cardiac: Goal: Ability to achieve and maintain adequate cardiopulmonary perfusion will improve Outcome: Progressing   Problem: Health Behavior/Discharge Planning: Goal: Ability to safely manage health-related needs after discharge will improve Outcome: Progressing   Problem: Education: Goal: Ability to demonstrate management of disease process will improve Outcome: Progressing Goal: Ability to verbalize understanding of medication therapies will improve Outcome: Progressing  Goal: Individualized Educational Video(s) Outcome: Progressing   Problem: Activity: Goal: Capacity to carry out activities will improve Outcome: Progressing   "

## 2024-12-03 NOTE — Progress Notes (Signed)
 Refused wound care to buttocks

## 2024-12-04 DIAGNOSIS — J188 Other pneumonia, unspecified organism: Secondary | ICD-10-CM | POA: Diagnosis not present

## 2024-12-04 DIAGNOSIS — I959 Hypotension, unspecified: Secondary | ICD-10-CM | POA: Diagnosis not present

## 2024-12-04 DIAGNOSIS — Z7189 Other specified counseling: Secondary | ICD-10-CM | POA: Diagnosis not present

## 2024-12-04 DIAGNOSIS — Z515 Encounter for palliative care: Secondary | ICD-10-CM | POA: Diagnosis not present

## 2024-12-04 NOTE — Progress Notes (Signed)
 " Progress Note    [name] BROKER Sr.   FMW:[id number]  DOB: [birth year]  DOA: 10/24/2024     42 PCP: [name], MD  Initial CC: Dyspnea  Hospital Course: [name] [name] is a 64 y.o. male who has a PMH as outlined below including but not limited to ESRD on HD MWF, A-fib, systolic CHF, hypertension, LBBB, DM.  He presented to [facility] ED on 10/24/2024 with dyspnea x roughly 2-3 weeks. He had been seen in the ED a few days early on 12/2 for dysuria and was started on a course of Cipro  which she started the following day and stated that he had been taking as prescribed.     CT chest that demonstrated multifocal pneumonia and small effusions.  Blood cultures ultimately grew MRSA.  He had been on vancomycin  which was continued. He had a left thora 12/10 with 450cc fluid removed.  His HD catheter was removed on 10/28/2024 as well as an arterial line.  He did have a TTE on 12/11 that showed an EF of 20-25%, G2 DD, mild decrease in RV SF, severe AAS, no vegetations.  TEE was recommended but had been deferred due to patient requiring vasopressor support.  ID was consulted and recommended line holiday.  He had a temporary femoral HD cath placed on 12/15.  ID recommendations included 6 weeks of vancomycin  to end on 12/09/2024.    12/17, he was transferred to [facility] for ongoing management for septic shock as well as for IR evaluation for tunneled HD catheter placement  Significant Hospital Events:  12/8 admit 12/10 L thora with 450cc removed, transudate, neg cultures 12/11 echo 12/12 HD cath and art line removed 12/15 new femoral HD cath placed 12/17 transfer to [facility] 8M 12/19 HD  12/20 CRRT 12/23 Weaned off levophed  this am. HD trial this am. Transferred to [facility] 12/25 Returned to ICU for hypotension 12/28-remains on Levophed  12/29-remains on Levophed , tolerating dialysis 12/31 was off Levophed  this morning 1/2 on Levophed  and had HD 1/4 changed to neo and vaso, stopped levophed  1/5  HD 1/6 CRRT and GOC discussion w/ pt's daughter  1/14 CRRT discontinued. Plan to transition to comfort care   Resolved problem list  Septic shock Multifocal pneumonia - s/p antibiotics (zosyn  x 4d and additional course of ceftriaxone  x 5 days) Acute hypoxemic respiratory failure, resolved   Assessment and Plan    GOC Palliative following. Discussion w/ pt and pt's daughter who is POA. PMT and CCM expressed concern to both how pt may not tolerate HD outpatient. See 1/6 IPAL note -Patient states will follow daughter's decision -He is aware that if he is unable to tolerate HD then he will die -Family at bedside. Discussed process to transition to comfort care. Addressed questions and concerns. Transitioning to comfort care 11/30/24. CRRT discontinued and vasopressor weaned off  Chronic hypotension requiring vasopressor support No evidence of worsening infection driving hypotension.  Not in heart failure.  Suspect related to poor cardiac function in setting of renal disease - Has been trialed on midodrine  and Strattera  which have now been discontinued in setting of comfort care - s/p CRRT with vasopressor dependence; now being transitioned off dialysis and vasopressor support to comfort care   MRSA bacteremia - s/p vanc; was to complete extended course to 1/23, but stopped in setting of comfort care - Unable to complete TEE at this time - 12/12 Bcx negative, 12/18 Bcx negative - 1/5 Bcx NGTD   Elevated LFTs Elevated  Bili Slowly improving with stable bilirubin. Denies abd pain and NT on abd exam. Afebrile. UQ US  w/ single mobile gallstone.  - continue comfort care    Afib w/ RVR - Currently rate controled - continue comfort care    Heart failure with reduced ejection fraction of 20 to 25% Severe aortic stenosis - Not on GDMT due to hypotension  - continue comfort care   End-stage renal disease on hemodialysis Hypervolemic with 3rd spacing - continue comfort care  - No further  dialysis   Type 2 diabetes CBG goal 140-180. Poor appetite so insulin  d/c.  - continue comfort care    Peripheral vascular disease - Aspirin  discontinued in setting of comfort care   Depressed mood Poor appetite - continue comfort care  Interval History:  No events overnight Appears about the same.  However, appetite has now decreased.  Did not eat much if any breakfast. Otherwise awake, alert, still able to carry on conversation easily.   Consultants:  Palliative care   Code Status:   Code Status: Do not attempt resuscitation (DNR) - Comfort care  Barriers to discharge: None Therapy evaluation: PT Orders:   PT Follow up Rec: Skilled Nursing-Short Term Rehab (<3 Hours/Day)11/25/2024 1500  Disposition Plan: Inpatient comfort care Status is: DNR  Mobility Assessment (Last 72 Hours)     Mobility Assessment     Row Name 12/04/24 0803 12/03/24 2110 12/03/24 0800 12/02/24 2244 12/02/24 0850   Does the patient have exclusion criteria? No- Perform mobility assessment Yes- Hold (Level 0) - Assessment complete Yes- Hold (Level 0) - Assessment complete Yes- Bedfast (Level 1) - Select exclusion criteria in next row No- Perform mobility assessment   Mobility Assessment Exclusion Criteria -- No exclusion criteria present, perform mobility assessment;Order for bedrest (clairify order) No exclusion criteria present, perform mobility assessment No exclusion criteria present, perform mobility assessment No exclusion criteria present, perform mobility assessment   What is the highest level of mobility based on the mobility assessment? Level 1 (Bedfast) - Unable to balance while sitting on edge of bed Level 0 (Hold) - At risk for rapid decline or arrest with movement or actively dying Level 1 (Bedfast) - Unable to balance while sitting on edge of bed Level 1 (Bedfast) - Unable to balance while sitting on edge of bed Level 1 (Bedfast) - Unable to balance while sitting on edge of bed   Is the above  level different from baseline mobility prior to current illness? -- No - Consider discontinuing PT/OT No - Consider discontinuing PT/OT No - Consider discontinuing PT/OT Yes - Recommend PT order    Row Name 12/01/24 2000           Does the patient have exclusion criteria? Yes- Bedfast (Level 1) - Select exclusion criteria in next row       What is the highest level of mobility based on the mobility assessment? Level 1 (Bedfast) - Unable to balance while sitting on edge of bed       Is the above level different from baseline mobility prior to current illness? Yes - Recommend PT order          Diet: Diet Orders (From admission, onward)     Start     Ordered   12/01/24 0919  Diet regular Room service appropriate? Yes with Assist; Fluid consistency: Thin  Diet effective now       Question Answer Comment  Room service appropriate? Yes with Assist   Fluid consistency: Thin  12/01/24 0918            Objective: Blood pressure (!) 77/34, pulse 79, temperature 97.6 F (36.4 C), temperature source Oral, resp. rate 15, height 6' 1 (1.854 m), weight 105.6 kg, SpO2 (!) 84%.  Examination:  Physical Exam Constitutional:      Appearance: Normal appearance.     Comments: Resting in bed in no distress significantly lethargic  HENT:     Head: Normocephalic and atraumatic.     Mouth/Throat:     Mouth: Mucous membranes are moist.  Eyes:     Extraocular Movements: Extraocular movements intact.  Cardiovascular:     Rate and Rhythm: Normal rate and regular rhythm.  Pulmonary:     Effort: Pulmonary effort is normal. No respiratory distress.     Breath sounds: Normal breath sounds. No wheezing.  Abdominal:     General: Bowel sounds are normal. There is no distension.     Palpations: Abdomen is soft.     Tenderness: There is no abdominal tenderness.  Musculoskeletal:        General: Normal range of motion.     Cervical back: Normal range of motion and neck supple.     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     Left lower leg: Edema (worsening, now 3+) present.  Skin:    General: Skin is warm and dry.  Neurological:     Comments: Able to squeeze fingers on command and wiggle toes.  Significant weakness noted.  Very mild asterixis appreciated in bilateral hands      Data Reviewed: No results found for this or any previous visit (from the past 24 hours).  I have reviewed pertinent nursing notes, vitals, labs, and images as necessary. I have ordered labwork to follow up on as indicated.  I have reviewed the last notes from staff over past 24 hours. I have discussed patient's care plan and test results with nursing staff, CM/SW, and other staff as appropriate.  Old records reviewed in assessment of this patient  Time spent: Greater than 50% of the 55 minute visit was spent in counseling/coordination of care for the patient as laid out in the A&P.   LOS: 41 days   Alm Apo, MD [facility] 12/04/2024, 1:36 PM "

## 2024-12-04 NOTE — Progress Notes (Signed)
 Patient's has temporary HD cath on Lt. Fem. Can flush, but no blood return from pig tail port. Secure chatted to Dr. Patsy regarding this matter and suggested not to do tPA this port since patient is comfort care. Dr. Patsy agreed this suggestion. HS [employer]

## 2024-12-05 DIAGNOSIS — J188 Other pneumonia, unspecified organism: Secondary | ICD-10-CM | POA: Diagnosis not present

## 2024-12-05 DIAGNOSIS — Z7189 Other specified counseling: Secondary | ICD-10-CM | POA: Diagnosis not present

## 2024-12-05 DIAGNOSIS — Z515 Encounter for palliative care: Secondary | ICD-10-CM | POA: Diagnosis not present

## 2024-12-05 NOTE — Progress Notes (Signed)
 " Progress Note    [name] ANDREPONT Sr.   FMW:[id number]  DOB: 1961-05-07  DOA: 10/24/2024     43 PCP: [name], MD  Initial CC: Dyspnea  Hospital Course: [name] [name] is a 64 y.o. male who has a PMH as outlined below including but not limited to ESRD on HD MWF, A-fib, systolic CHF, hypertension, LBBB, DM.  He presented to [facility] ED on 10/24/2024 with dyspnea x roughly 2-3 weeks. He had been seen in the ED a few days early on 12/2 for dysuria and was started on a course of Cipro  which she started the following day and stated that he had been taking as prescribed.     CT chest that demonstrated multifocal pneumonia and small effusions.  Blood cultures ultimately grew MRSA.  He had been on vancomycin  which was continued. He had a left thora 12/10 with 450cc fluid removed.  His HD catheter was removed on 10/28/2024 as well as an arterial line.  He did have a TTE on 12/11 that showed an EF of 20-25%, G2 DD, mild decrease in RV SF, severe AAS, no vegetations.  TEE was recommended but had been deferred due to patient requiring vasopressor support.  ID was consulted and recommended line holiday.  He had a temporary femoral HD cath placed on 12/15.  ID recommendations included 6 weeks of vancomycin  to end on 12/09/2024.    12/17, he was transferred to [facility] for ongoing management for septic shock as well as for IR evaluation for tunneled HD catheter placement  Significant Hospital Events:  12/8 admit 12/10 L thora with 450cc removed, transudate, neg cultures 12/11 echo 12/12 HD cath and art line removed 12/15 new femoral HD cath placed 12/17 transfer to [facility] 34M 12/19 HD  12/20 CRRT 12/23 Weaned off levophed  this am. HD trial this am. Transferred to [facility] 12/25 Returned to ICU for hypotension 12/28-remains on Levophed  12/29-remains on Levophed , tolerating dialysis 12/31 was off Levophed  this morning 1/2 on Levophed  and had HD 1/4 changed to neo and vaso, stopped levophed  1/5  HD 1/6 CRRT and GOC discussion w/ pt's daughter  1/14 CRRT discontinued. Plan to transition to comfort care   Resolved problem list  Septic shock Multifocal pneumonia - s/p antibiotics (zosyn  x 4d and additional course of ceftriaxone  x 5 days) Acute hypoxemic respiratory failure, resolved   Assessment and Plan    GOC Palliative following. Discussion w/ pt and pt's daughter who is POA. PMT and CCM expressed concern to both how pt may not tolerate HD outpatient. See 1/6 IPAL note -Patient states will follow daughter's decision -He is aware that if he is unable to tolerate HD then he will die -Family at bedside. Discussed process to transition to comfort care. Addressed questions and concerns. Transitioning to comfort care 11/30/24. CRRT discontinued and vasopressor weaned off - Continue comfort care; currently has not been requiring any as needed medications  Chronic hypotension requiring vasopressor support No evidence of worsening infection driving hypotension.  Not in heart failure.  Suspect related to poor cardiac function in setting of renal disease - Has been trialed on midodrine  and Strattera  which have now been discontinued in setting of comfort care - s/p CRRT with vasopressor dependence; now being transitioned off dialysis and vasopressor support to comfort care   MRSA bacteremia - s/p vanc; was to complete extended course to 1/23, but stopped in setting of comfort care - Unable to complete TEE at this time - 12/12 Bcx  negative, 12/18 Bcx negative - 1/5 Bcx NGTD   Elevated LFTs Elevated Bili Slowly improving with stable bilirubin. Denies abd pain and NT on abd exam. Afebrile. UQ US  w/ single mobile gallstone.  - continue comfort care    Afib w/ RVR - Currently rate controled - continue comfort care    Heart failure with reduced ejection fraction of 20 to 25% Severe aortic stenosis - Not on GDMT due to hypotension  - continue comfort care   End-stage renal disease on  hemodialysis Hypervolemic with 3rd spacing - continue comfort care  - No further dialysis   Type 2 diabetes CBG goal 140-180. Poor appetite so insulin  d/c.  - continue comfort care    Peripheral vascular disease - Aspirin  discontinued in setting of comfort care   Depressed mood Poor appetite - continue comfort care  Interval History:  No events overnight Appears about the same.  Slightly poor appetite at times yesterday but feels a little bit better today.  No confusion and remains awake and alert.   Consultants:  Palliative care   Code Status:   Code Status: Do not attempt resuscitation (DNR) - Comfort care  Barriers to discharge: None Therapy evaluation: PT Orders:   PT Follow up Rec: Skilled Nursing-Short Term Rehab (<3 Hours/Day)11/25/2024 1500  Disposition Plan: Inpatient comfort care Status is: DNR  Mobility Assessment (Last 72 Hours)     Mobility Assessment     Row Name 12/05/24 0900 12/04/24 2354 12/04/24 0803 12/03/24 2110 12/03/24 0800   Does the patient have exclusion criteria? Yes- Hold (Level 0) - Assessment complete Yes- Hold (Level 0) - Assessment complete No- Perform mobility assessment Yes- Hold (Level 0) - Assessment complete Yes- Hold (Level 0) - Assessment complete   Mobility Assessment Exclusion Criteria No exclusion criteria present, perform mobility assessment -- -- No exclusion criteria present, perform mobility assessment;Order for bedrest (clairify order) No exclusion criteria present, perform mobility assessment   What is the highest level of mobility based on the mobility assessment? Level 0 (Hold) - At risk for rapid decline or arrest with movement or actively dying Level 0 (Hold) - At risk for rapid decline or arrest with movement or actively dying Level 1 (Bedfast) - Unable to balance while sitting on edge of bed Level 0 (Hold) - At risk for rapid decline or arrest with movement or actively dying Level 1 (Bedfast) - Unable to balance while  sitting on edge of bed   Is the above level different from baseline mobility prior to current illness? No - Consider discontinuing PT/OT No - Consider discontinuing PT/OT -- No - Consider discontinuing PT/OT No - Consider discontinuing PT/OT    Row Name 12/02/24 2244           Does the patient have exclusion criteria? Yes- Bedfast (Level 1) - Select exclusion criteria in next row       Mobility Assessment Exclusion Criteria No exclusion criteria present, perform mobility assessment       What is the highest level of mobility based on the mobility assessment? Level 1 (Bedfast) - Unable to balance while sitting on edge of bed       Is the above level different from baseline mobility prior to current illness? No - Consider discontinuing PT/OT          Diet: Diet Orders (From admission, onward)     Start     Ordered   12/01/24 0919  Diet regular Room service appropriate? Yes with Assist; Fluid consistency:  Thin  Diet effective now       Question Answer Comment  Room service appropriate? Yes with Assist   Fluid consistency: Thin      12/01/24 0918            Objective: Blood pressure (!) 97/18, pulse 86, temperature 97.9 F (36.6 C), temperature source Oral, resp. rate 15, height 6' 1 (1.854 m), weight 105.6 kg, SpO2 100%.  Examination:  Physical Exam Constitutional:      Appearance: Normal appearance.     Comments: Resting in bed in no distress.  Awake and alert  HENT:     Head: Normocephalic and atraumatic.     Mouth/Throat:     Mouth: Mucous membranes are moist.  Eyes:     Extraocular Movements: Extraocular movements intact.  Cardiovascular:     Rate and Rhythm: Normal rate and regular rhythm.  Pulmonary:     Effort: Pulmonary effort is normal. No respiratory distress.     Breath sounds: Normal breath sounds. No wheezing.  Abdominal:     General: Bowel sounds are normal. There is no distension.     Palpations: Abdomen is soft.     Tenderness: There is no abdominal  tenderness.  Musculoskeletal:        General: Normal range of motion.     Cervical back: Normal range of motion and neck supple.     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     Left lower leg: Edema (worsening, now 3+) present.  Skin:    General: Skin is warm and dry.  Neurological:     Comments: Able to squeeze fingers on command and wiggle toes.  Significant weakness noted.  Very mild asterixis appreciated in bilateral hands      Data Reviewed: No results found for this or any previous visit (from the past 24 hours).  I have reviewed pertinent nursing notes, vitals, labs, and images as necessary. I have ordered labwork to follow up on as indicated.  I have reviewed the last notes from staff over past 24 hours. I have discussed patient's care plan and test results with nursing staff, CM/SW, and other staff as appropriate.  Old records reviewed in assessment of this patient  Time spent: Greater than 50% of the 55 minute visit was spent in counseling/coordination of care for the patient as laid out in the A&P.   LOS: 42 days   Alm Apo, MD [facility] 12/05/2024, 11:34 AM "

## 2024-12-05 NOTE — Plan of Care (Signed)
" °  Problem: Education: Goal: Knowledge of General Education information will improve Description: Including pain rating scale, medication(s)/side effects and non-pharmacologic comfort measures Outcome: Progressing   Problem: Nutrition: Goal: Adequate nutrition will be maintained Outcome: Not Progressing   Problem: Health Behavior/Discharge Planning: Goal: Ability to manage health-related needs will improve Outcome: Completed/Met   Problem: Activity: Goal: Risk for activity intolerance will decrease Outcome: Not Applicable   "

## 2024-12-05 NOTE — Progress Notes (Signed)
 Dressing change completed per order, pt tolerated well with no c/o pain. Dressings signed and dated.

## 2024-12-06 DIAGNOSIS — I2510 Atherosclerotic heart disease of native coronary artery without angina pectoris: Secondary | ICD-10-CM | POA: Diagnosis not present

## 2024-12-06 DIAGNOSIS — Z515 Encounter for palliative care: Secondary | ICD-10-CM | POA: Diagnosis not present

## 2024-12-06 DIAGNOSIS — I5022 Chronic systolic (congestive) heart failure: Secondary | ICD-10-CM | POA: Diagnosis not present

## 2024-12-06 DIAGNOSIS — J189 Pneumonia, unspecified organism: Secondary | ICD-10-CM | POA: Diagnosis not present

## 2024-12-06 DIAGNOSIS — I739 Peripheral vascular disease, unspecified: Secondary | ICD-10-CM | POA: Diagnosis not present

## 2024-12-06 DIAGNOSIS — Z66 Do not resuscitate: Secondary | ICD-10-CM | POA: Diagnosis not present

## 2024-12-06 DIAGNOSIS — J188 Other pneumonia, unspecified organism: Secondary | ICD-10-CM | POA: Diagnosis not present

## 2024-12-06 DIAGNOSIS — I959 Hypotension, unspecified: Secondary | ICD-10-CM | POA: Diagnosis not present

## 2024-12-06 DIAGNOSIS — Z7189 Other specified counseling: Secondary | ICD-10-CM | POA: Diagnosis not present

## 2024-12-06 DIAGNOSIS — E1122 Type 2 diabetes mellitus with diabetic chronic kidney disease: Secondary | ICD-10-CM | POA: Diagnosis not present

## 2024-12-06 DIAGNOSIS — N189 Chronic kidney disease, unspecified: Secondary | ICD-10-CM | POA: Diagnosis not present

## 2024-12-06 NOTE — Progress Notes (Signed)
 " Progress Note    [name] HAUGAN Sr.   FMW:[id number]  DOB: November 29, 1960  DOA: 10/24/2024     44 PCP: [name], MD  Initial CC: Dyspnea  Hospital Course: [place] [name] Domino Sr. is a 64 y.o. male who has a PMH as outlined below including but not limited to ESRD on HD MWF, A-fib, systolic CHF, hypertension, LBBB, DM.  He presented to [facility] ED on 10/24/2024 with dyspnea x roughly 2-3 weeks. He had been seen in the ED a few days early on 12/2 for dysuria and was started on a course of Cipro  which she started the following day and stated that he had been taking as prescribed.     CT chest that demonstrated multifocal pneumonia and small effusions.  Blood cultures ultimately grew MRSA.  He had been on vancomycin  which was continued. He had a left thora 12/10 with 450cc fluid removed.  His HD catheter was removed on 10/28/2024 as well as an arterial line.  He did have a TTE on 12/11 that showed an EF of 20-25%, G2 DD, mild decrease in RV SF, severe AAS, no vegetations.  TEE was recommended but had been deferred due to patient requiring vasopressor support.  ID was consulted and recommended line holiday.  He had a temporary femoral HD cath placed on 12/15.  ID recommendations included 6 weeks of vancomycin  to end on 12/09/2024.    12/17, he was transferred to [facility] for ongoing management for septic shock as well as for IR evaluation for tunneled HD catheter placement  Significant Hospital Events:  12/8 admit 12/10 L thora with 450cc removed, transudate, neg cultures 12/11 echo 12/12 HD cath and art line removed 12/15 new femoral HD cath placed 12/17 transfer to [facility] 26M 12/19 HD  12/20 CRRT 12/23 Weaned off levophed  this am. HD trial this am. Transferred to [facility] 12/25 Returned to ICU for hypotension 12/28-remains on Levophed  12/29-remains on Levophed , tolerating dialysis 12/31 was off Levophed  this morning 1/2 on Levophed  and had HD 1/4 changed to neo and vaso, stopped levophed  1/5  HD 1/6 CRRT and GOC discussion w/ pt's daughter  1/14 CRRT discontinued. Plan to transition to comfort care   Resolved problem list  Septic shock Multifocal pneumonia - s/p antibiotics (zosyn  x 4d and additional course of ceftriaxone  x 5 days) Acute hypoxemic respiratory failure, resolved   Assessment and Plan    GOC Palliative following. Discussion w/ pt and pt's daughter who is POA. PMT and CCM expressed concern to both how pt may not tolerate HD outpatient. See 1/6 IPAL note -Patient states will follow daughter's decision -He is aware that if he is unable to tolerate HD then he will die -Family at bedside. Discussed process to transition to comfort care. Addressed questions and concerns. Transitioning to comfort care 11/30/24. CRRT discontinued and vasopressor weaned off - Continue comfort care; currently has not been requiring any as needed medications  Chronic hypotension requiring vasopressor support No evidence of worsening infection driving hypotension.  Not in heart failure.  Suspect related to poor cardiac function in setting of renal disease - Has been trialed on midodrine  and Strattera  which have now been discontinued in setting of comfort care - s/p CRRT with vasopressor dependence; now being transitioned off dialysis and vasopressor support to comfort care   MRSA bacteremia - s/p vanc; was to complete extended course to 1/23, but stopped in setting of comfort care - Unable to complete TEE at this time - 12/12 Bcx  negative, 12/18 Bcx negative - 1/5 Bcx NGTD   Elevated LFTs Elevated Bili Slowly improving with stable bilirubin. Denies abd pain and NT on abd exam. Afebrile. UQ US  w/ single mobile gallstone.  - continue comfort care    Afib w/ RVR - Currently rate controled - continue comfort care    Heart failure with reduced ejection fraction of 20 to 25% Severe aortic stenosis - Not on GDMT due to hypotension  - continue comfort care   End-stage renal disease on  hemodialysis Hypervolemic with 3rd spacing - continue comfort care  - No further dialysis   Type 2 diabetes CBG goal 140-180. Poor appetite so insulin  d/c.  - continue comfort care    Peripheral vascular disease - Aspirin  discontinued in setting of comfort care   Depressed mood Poor appetite - continue comfort care  Interval History:  No events overnight Appears about the same.  Slightly poor appetite at times yesterday but feels a little bit better today.  No confusion and remains awake and alert.   Consultants:  Palliative care   Code Status:   Code Status: Do not attempt resuscitation (DNR) - Comfort care  Barriers to discharge: None Therapy evaluation: PT Orders:   PT Follow up Rec: Skilled Nursing-Short Term Rehab (<3 Hours/Day)11/25/2024 1500  Disposition Plan: Inpatient comfort care Status is: DNR  Mobility Assessment (Last 72 Hours)     Mobility Assessment     Row Name 12/06/24 1124 12/05/24 2024 12/05/24 0900 12/04/24 2354 12/04/24 0803   Does the patient have exclusion criteria? No- Perform mobility assessment Yes- Hold (Level 0) - Assessment complete Yes- Hold (Level 0) - Assessment complete Yes- Hold (Level 0) - Assessment complete No- Perform mobility assessment   Mobility Assessment Exclusion Criteria -- -- No exclusion criteria present, perform mobility assessment -- --   What is the highest level of mobility based on the mobility assessment? Level 1 (Bedfast) - Unable to balance while sitting on edge of bed Level 0 (Hold) - At risk for rapid decline or arrest with movement or actively dying Level 0 (Hold) - At risk for rapid decline or arrest with movement or actively dying Level 0 (Hold) - At risk for rapid decline or arrest with movement or actively dying Level 1 (Bedfast) - Unable to balance while sitting on edge of bed   Is the above level different from baseline mobility prior to current illness? -- No - Consider discontinuing PT/OT No - Consider  discontinuing PT/OT No - Consider discontinuing PT/OT --    Row Name 12/03/24 2110           Does the patient have exclusion criteria? Yes- Hold (Level 0) - Assessment complete       Mobility Assessment Exclusion Criteria No exclusion criteria present, perform mobility assessment;Order for bedrest (clairify order)       What is the highest level of mobility based on the mobility assessment? Level 0 (Hold) - At risk for rapid decline or arrest with movement or actively dying       Is the above level different from baseline mobility prior to current illness? No - Consider discontinuing PT/OT          Diet: Diet Orders (From admission, onward)     Start     Ordered   12/01/24 0919  Diet regular Room service appropriate? Yes with Assist; Fluid consistency: Thin  Diet effective now       Question Answer Comment  Room service appropriate? Yes with  Assist   Fluid consistency: Thin      12/01/24 0918            Objective: Blood pressure (!) 87/32, pulse (!) 168, temperature 97.8 F (36.6 C), temperature source Oral, resp. rate 16, height 6' 1 (1.854 m), weight 105.6 kg, SpO2 100%.  Examination:  Physical Exam Constitutional:      Appearance: Normal appearance.     Comments: Resting in bed in no distress.  Awake and alert  HENT:     Head: Normocephalic and atraumatic.     Mouth/Throat:     Mouth: Mucous membranes are moist.  Eyes:     Extraocular Movements: Extraocular movements intact.  Cardiovascular:     Rate and Rhythm: Normal rate and regular rhythm.  Pulmonary:     Effort: Pulmonary effort is normal. No respiratory distress.     Breath sounds: Normal breath sounds. No wheezing.  Abdominal:     General: Bowel sounds are normal. There is no distension.     Palpations: Abdomen is soft.     Tenderness: There is no abdominal tenderness.  Musculoskeletal:        General: Normal range of motion.     Cervical back: Normal range of motion and neck supple.     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     Left lower leg: Edema (worsening, now 3+) present.  Skin:    General: Skin is warm and dry.  Neurological:     Comments: Able to squeeze fingers on command and wiggle toes.  Significant weakness noted.  Very mild asterixis appreciated in bilateral hands      Data Reviewed: No results found for this or any previous visit (from the past 24 hours).  I have reviewed pertinent nursing notes, vitals, labs, and images as necessary. I have ordered labwork to follow up on as indicated.  I have reviewed the last notes from staff over past 24 hours. I have discussed patient's care plan and test results with nursing staff, CM/SW, and other staff as appropriate.  Old records reviewed in assessment of this patient    LOS: 43 days   Alm Apo, MD [facility] 12/06/2024, 12:25 PM "

## 2024-12-06 NOTE — Progress Notes (Signed)
 " Daily Progress Note   Date: 12/06/2024   Patient Name: [name] HUMPHRES Sr.  DOB: [birth year]  MRN: [id number]  Age / Sex: 64 y.o., male  Attending Physician: Patsy Lenis, MD Primary Care Physician: [name], MD Admit Date: 10/24/2024 Length of Stay: 43 days  Reason for Follow-up: Establishing goals of care and Terminal Care  Past Medical History:  Diagnosis Date   ESRD on hemodialysis (HCC)    a. HD MWF > 10 years.   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 01/2022 Echo: EF 45-50%, glob HK, mod LVH, mildly reduced RV fxn, mild MR, mod-sev MV Ca2+. Mild AS; b. 10/2023 Echo: EF 45-50%, glob HK, GrII DD, mildly reduced RV fxn, RVSP 52.8mmHg. Mild MR/MS, mod TR.   Hypertension    Nonischemic cardiomyopathy (HCC)    a. 2019 Cath [facility]): Nonobstructive disease; b. 12/2019 Echo: EF 50%; c. 12/2019 MV (Tx w/u [facility]): Ant infarct w/ peri-infarct ischemia. EF 35%; c. 05/2021 MV (Tx w/u [facility]): Cor Ca2+, EF 53%, motion artifact, equiv study, likely low risk.   Nonobstructive coronary artery disease    a.  2019 Cath ([facility]): Left main normal, LAD 30 proximal, RCA 30 distal, EF 25%.   Obesity    Peripheral arterial disease    Peripheral vascular disease    Type 2 diabetes mellitus (HCC)    a. 10/2023 - admission w/ HHS.    Assessment & Plan:   HPI/Patient Profile:   64 y.o. male  with past medical history of ESRD on HD, HFrEF (20-25% EF), T2DM, atrial fibrillation admitted on 10/24/2024 with MRSA bacteremia and AHRF 2/2 pneumonia. Per H&P on 10/24/2024 by Pearlean MD, patient presented to [facility] for 2-3 weeks of shortness of breath. CT chest on 10/24/2024 demonstrated multilobar bonchopneumonia. Blood cultures on 10/24/2024 positive for MRSA bacteremia. 10/26/2024 had left thoracentesis with 450 mL transudate fluid removed with negative cultures. Transferred to [facility] 11/02/2024 for management of septic shock and IR evaluation for [facility] placement which has not  been completed due to continued pressor needs.   Palliative medicine consulted for goals of care conversation.   Patient transitioned to comfort measures on 11/30/2024 by CCMD. Transferred to hospitalist service on 12/01/2024. Patient received tylenol  650 mg x2, dilaudid  1 mg PO, atarax  25 mg PO, and oxycodone  10 mg x2 in the last 24 hours. Symptoms continue to be well managed with PRNs at this time.   SUMMARY OF RECOMMENDATIONS DNR-comfort Family would prefer patient remain in hospital and not transfer to hospice facility Palliative medicine will continue to follow for symptom management  Symptom Management:  Dilaudid  pain/dyspnea/increased work of breathing/RR>25 Hydroxyzine  first line, Benadryl  second line for PRN itching Robinul  PRN secretions Ativan  PRN anxiety/seizure/sleep/distress Liquifilm Tears PRN dry eye  Code Status: DNR - Comfort  Prognosis: < 2 weeks  Discharge Planning: Anticipated Hospital Death  Subjective:   Subjective: Chart Reviewed. Updates received. Patient Assessed. Created space and opportunity for patient  and family to explore thoughts and feelings regarding current medical situation.  Today's Discussion:  Met with patient at bedside today who was sleeping but awakes to voice. Patient reports poor appetite. Reports sacral pain rating it as a 9/10 - primary RN notified to administer PRN analgesic for pain. Patient denied any pruritus. Patient had a meal tray and outside food brought in by family but did not eat anything.   Review of Systems  Musculoskeletal:        Sacral pain  Objective:   Primary Diagnoses: Present on Admission:  Multifocal pneumonia  Below-knee amputation of right lower extremity (HCC)  Chronic systolic CHF (congestive heart failure) (HCC)  Type 2 diabetes mellitus with diabetic chronic kidney disease (HCC)  (Resolved) Severe sepsis with septic shock  MRSA bacteremia  Hypotension  CAD (coronary artery disease)  Peripheral  vascular disease, unspecified   Vital Signs:  BP (!) 87/32 (BP Location: Right Arm)   Pulse (!) 168   Temp 97.8 F (36.6 C) (Oral)   Resp 16   Ht 6' 1 (1.854 m)   Wt 105.6 kg   SpO2 100%   BMI 30.72 kg/m   Physical Exam Constitutional:      Appearance: He is ill-appearing.  HENT:     Head: Normocephalic and atraumatic.  Eyes:     Extraocular Movements: Extraocular movements intact.  Cardiovascular:     Rate and Rhythm: Normal rate.  Pulmonary:     Effort: Pulmonary effort is normal.  Musculoskeletal:     Right lower leg: Edema present.     Left lower leg: Edema present.  Skin:    General: Skin is warm and dry.  Neurological:     General: No focal deficit present.     Mental Status: He is alert.  Psychiatric:        Mood and Affect: Mood normal.    Palliative Assessment/Data: 40%   Existing Vynca/ACP Documentation: HCPOA daughter - [name] [name]  Thank you for allowing us  to participate in the care of [name] SCHMUTZ Sr. PMT will continue to support holistically.  I personally spent a total of 25 minutes in the care of the patient today including preparing to see the patient and performing a medically appropriate exam/evaluation.  Fairy FORBES Shan DEVONNA  Palliative Medicine Team  Team Phone # [phone] (Nights/Weekends) 12/06/2024 3:37 PM  "

## 2024-12-07 DIAGNOSIS — I739 Peripheral vascular disease, unspecified: Secondary | ICD-10-CM | POA: Diagnosis not present

## 2024-12-07 DIAGNOSIS — Z515 Encounter for palliative care: Secondary | ICD-10-CM | POA: Diagnosis not present

## 2024-12-07 DIAGNOSIS — I959 Hypotension, unspecified: Secondary | ICD-10-CM | POA: Diagnosis not present

## 2024-12-07 DIAGNOSIS — N186 End stage renal disease: Secondary | ICD-10-CM | POA: Diagnosis not present

## 2024-12-07 DIAGNOSIS — E1122 Type 2 diabetes mellitus with diabetic chronic kidney disease: Secondary | ICD-10-CM | POA: Diagnosis not present

## 2024-12-07 DIAGNOSIS — Z66 Do not resuscitate: Secondary | ICD-10-CM | POA: Diagnosis not present

## 2024-12-07 DIAGNOSIS — I2510 Atherosclerotic heart disease of native coronary artery without angina pectoris: Secondary | ICD-10-CM | POA: Diagnosis not present

## 2024-12-07 DIAGNOSIS — N189 Chronic kidney disease, unspecified: Secondary | ICD-10-CM | POA: Diagnosis not present

## 2024-12-07 DIAGNOSIS — J189 Pneumonia, unspecified organism: Secondary | ICD-10-CM | POA: Diagnosis not present

## 2024-12-07 DIAGNOSIS — J188 Other pneumonia, unspecified organism: Secondary | ICD-10-CM | POA: Diagnosis not present

## 2024-12-07 DIAGNOSIS — I5022 Chronic systolic (congestive) heart failure: Secondary | ICD-10-CM | POA: Diagnosis not present

## 2024-12-07 NOTE — Progress Notes (Signed)
" °  Daily Progress Note   Date: 12/07/2024   Patient Name: [name] PRESLAR Sr.  DOB: [birth year]  MRN: [id number]  Age / Sex: 64 y.o., male  Attending Physician: Cindy Garnette MARLA, MD Primary Care Physician: [name], MD Admit Date: 10/24/2024 Length of Stay: 44 days  Reason for Follow-up: {Reason for Consult:23484}  Past Medical History:  Diagnosis Date   ESRD on hemodialysis (HCC)    a. HD MWF > 10 years.   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 01/2022 Echo: EF 45-50%, glob HK, mod LVH, mildly reduced RV fxn, mild MR, mod-sev MV Ca2+. Mild AS; b. 10/2023 Echo: EF 45-50%, glob HK, GrII DD, mildly reduced RV fxn, RVSP 52.57mmHg. Mild MR/MS, mod TR.   Hypertension    Nonischemic cardiomyopathy (HCC)    a. 2019 Cath [facility]): Nonobstructive disease; b. 12/2019 Echo: EF 50%; c. 12/2019 MV (Tx w/u [facility]): Ant infarct w/ peri-infarct ischemia. EF 35%; c. 05/2021 MV (Tx w/u [facility]): Cor Ca2+, EF 53%, motion artifact, equiv study, likely low risk.   Nonobstructive coronary artery disease    a.  2019 Cath ([facility]): Left main normal, LAD 30 proximal, RCA 30 distal, EF 25%.   Obesity    Peripheral arterial disease    Peripheral vascular disease    Type 2 diabetes mellitus (HCC)    a. 10/2023 - admission w/ HHS.    Assessment & Plan:   HPI/Patient Profile:  ***  SUMMARY OF RECOMMENDATIONS   ***  Symptom Management:  ***  Code Status: {Updated Palliative Code Status:33307}  Prognosis: {Palliative Care Prognosis:23504}  Discharge Planning: {Palliative dispostion:23505}  Discussed with: ***  Subjective:   Subjective: Chart Reviewed. Updates received. Patient Assessed. Created space and opportunity for patient  and family to explore thoughts and feelings regarding current medical situation.  Today's Discussion: Today before meeting with the patient/family, I reviewed the chart notes including ***. I also reviewed vital signs, nursing flowsheets,  medication administrations record, labs, and imaging. Labs reviewed include ***.  ***  Review of Systems  Objective:   Primary Diagnoses: Present on Admission:  Multifocal pneumonia  Below-knee amputation of right lower extremity (HCC)  Chronic systolic CHF (congestive heart failure) (HCC)  Type 2 diabetes mellitus with diabetic chronic kidney disease (HCC)  (Resolved) Severe sepsis with septic shock  MRSA bacteremia  Hypotension  CAD (coronary artery disease)  Peripheral vascular disease, unspecified   Vital Signs:  BP (!) 89/69 (BP Location: Right Arm)   Pulse 87   Temp (!) 97.5 F (36.4 C) (Oral)   Resp 18   Ht 6' 1 (1.854 m)   Wt 105.6 kg   SpO2 98%   BMI 30.72 kg/m   Physical Exam  Palliative Assessment/Data: ***   Existing Vynca/ACP Documentation: ***  Thank you for allowing us  to participate in the care of [name] CORRADI Sr. PMT will continue to support holistically.  Time Total: *** Billing based on MDM: ***  {Problems Addressed:304933}  {Amount and/or Complexity of Data:304934}  {Risks:304936}   Detailed review of medical records (labs, imaging, vital signs), medically appropriate exam, discussed with treatment team, counseling and education to patient, family, & staff, documenting clinical information, medication management, coordination of care  Fairy FORBES Shan DEVONNA  Palliative Medicine Team  Team Phone # [phone] (Nights/Weekends) 12/07/2024 5:20 PM  "

## 2024-12-07 NOTE — Plan of Care (Signed)
   Problem: Skin Integrity: Goal: Risk for impaired skin integrity will decrease Outcome: Progressing

## 2024-12-07 NOTE — Progress Notes (Signed)
 " Progress Note   Patient: [name] CHERIAN Sr. FMW:[id number] DOB: 03-27-1961 DOA: 10/24/2024     44 DOS: the patient was seen and examined on 12/07/2024   Brief hospital course: 64 y.o. male who has a PMH as outlined below including but not limited to ESRD on HD MWF, A-fib, systolic CHF, hypertension, LBBB, DM.  He presented to [facility] ED on 10/24/2024 with dyspnea x roughly 2-3 weeks. He had been seen in the ED a few days early on 12/2 for dysuria and was started on a course of Cipro  which she started the following day and stated that he had been taking as prescribed.     CT chest that demonstrated multifocal pneumonia and small effusions.  Blood cultures ultimately grew MRSA.  He had been on vancomycin  which was continued. He had a left thora 12/10 with 450cc fluid removed.  His HD catheter was removed on 10/28/2024 as well as an arterial line.  He did have a TTE on 12/11 that showed an EF of 20-25%, G2 DD, mild decrease in RV SF, severe AAS, no vegetations.  TEE was recommended but had been deferred due to patient requiring vasopressor support.  ID was consulted and recommended line holiday.  He had a temporary femoral HD cath placed on 12/15.  ID recommendations included 6 weeks of vancomycin  to end on 12/09/2024.    12/17, he was transferred to [facility] for ongoing management for septic shock as well as for IR evaluation for tunneled HD catheter placement   Significant Hospital Events:  12/8 admit 12/10 L thora with 450cc removed, transudate, neg cultures 12/11 echo 12/12 HD cath and art line removed 12/15 new femoral HD cath placed 12/17 transfer to [facility] 40M 12/19 HD  12/20 CRRT 12/23 Weaned off levophed  this am. HD trial this am. Transferred to [facility] 12/25 Returned to ICU for hypotension 12/28-remains on Levophed  12/29-remains on Levophed , tolerating dialysis 12/31 was off Levophed  this morning 1/2 on Levophed  and had HD 1/4 changed to neo and vaso, stopped levophed  1/5 HD 1/6 CRRT and  GOC discussion w/ pt's daughter  1/14 CRRT discontinued. Plan to transition to comfort care  Assessment and Plan: GOC Palliative following. Discussion w/ pt and pt's daughter who is POA. PMT and CCM expressed concern to both how pt may not tolerate HD outpatient. See 1/6 IPAL note -Patient states will follow daughter's decision -He is aware that if he is unable to tolerate HD then he will die -After family discussion, pt was transitioned to comfort care 11/30/24. CRRT discontinued and vasopressor weaned off - Continue comfort care; currently has not been requiring any as needed medications   Chronic hypotension requiring vasopressor support No evidence of worsening infection driving hypotension.  Not in heart failure.  Suspect related to poor cardiac function in setting of renal disease - Has been trialed on midodrine  and Strattera  which have now been discontinued in setting of comfort care - s/p CRRT with vasopressor dependence; now being transitioned off dialysis and vasopressor support to comfort care   MRSA bacteremia - s/p vanc; was to complete extended course to 1/23, but stopped in setting of comfort care - Unable to complete TEE at this time - 12/12 Bcx negative, 12/18 Bcx negative - 1/5 Bcx NGTD   Elevated LFTs Elevated Bili Slowly improving with stable bilirubin. Denies abd pain and NT on abd exam. Afebrile. UQ US  w/ single mobile gallstone.  - continue comfort care    Afib w/ RVR - Currently  rate controled - continue comfort care    Heart failure with reduced ejection fraction of 20 to 25% Severe aortic stenosis - Not on GDMT due to hypotension  - continue comfort care    End-stage renal disease on hemodialysis Hypervolemic with 3rd spacing - continue comfort care  - No further dialysis   Type 2 diabetes CBG goal 140-180. Poor appetite so insulin  d/c.  - continue comfort care    Peripheral vascular disease - Aspirin  discontinued in setting of comfort care    Depressed mood Poor appetite - continue comfort care      Subjective: Without complaints this AM  Physical Exam: Vitals:   12/05/24 0631 12/06/24 0500 12/07/24 0426 12/07/24 1105  BP: (!) 97/18 (!) 87/32 (!) 96/52 (!) 89/69  Pulse: 86 (!) 168 82 87  Resp: 15 16  18   Temp: 97.9 F (36.6 C) 97.8 F (36.6 C) (!) 97.5 F (36.4 C) (!) 97.5 F (36.4 C)  TempSrc: Oral Oral Oral Oral  SpO2: 100% 100% 95% 98%  Weight:      Height:       General exam: Awake, laying in bed, in nad Respiratory system: Normal respiratory effort, no wheezing Cardiovascular system: regular rate, s1, s2 Gastrointestinal system: Soft, nondistended, positive BS Central nervous system: CN2-12 grossly intact, strength intact Extremities: Perfused, no clubbing Skin: Normal skin turgor, no notable skin lesions seen Psychiatry: Mood normal // affect flat  Data Reviewed:  There are no new results to review at this time.  Family Communication: Pt in room, family not at bedside  Disposition: Status is: Inpatient Remains inpatient appropriate because: severity of illness  Planned Discharge Destination: hospice    Author: Garnette Pelt, MD 12/07/2024 3:42 PM  For on call review [email or web address].  "

## 2024-12-08 DIAGNOSIS — I2510 Atherosclerotic heart disease of native coronary artery without angina pectoris: Secondary | ICD-10-CM | POA: Diagnosis not present

## 2024-12-08 DIAGNOSIS — I5022 Chronic systolic (congestive) heart failure: Secondary | ICD-10-CM | POA: Diagnosis not present

## 2024-12-08 DIAGNOSIS — N186 End stage renal disease: Secondary | ICD-10-CM | POA: Diagnosis not present

## 2024-12-08 DIAGNOSIS — E1122 Type 2 diabetes mellitus with diabetic chronic kidney disease: Secondary | ICD-10-CM | POA: Diagnosis not present

## 2024-12-08 DIAGNOSIS — Z515 Encounter for palliative care: Secondary | ICD-10-CM | POA: Diagnosis not present

## 2024-12-08 DIAGNOSIS — N189 Chronic kidney disease, unspecified: Secondary | ICD-10-CM | POA: Diagnosis not present

## 2024-12-08 DIAGNOSIS — J189 Pneumonia, unspecified organism: Secondary | ICD-10-CM | POA: Diagnosis not present

## 2024-12-08 DIAGNOSIS — I959 Hypotension, unspecified: Secondary | ICD-10-CM | POA: Diagnosis not present

## 2024-12-08 DIAGNOSIS — Z66 Do not resuscitate: Secondary | ICD-10-CM | POA: Diagnosis not present

## 2024-12-08 DIAGNOSIS — J188 Other pneumonia, unspecified organism: Secondary | ICD-10-CM | POA: Diagnosis not present

## 2024-12-08 DIAGNOSIS — I739 Peripheral vascular disease, unspecified: Secondary | ICD-10-CM | POA: Diagnosis not present

## 2024-12-08 NOTE — Plan of Care (Signed)
   Problem: Nutrition: Goal: Adequate nutrition will be maintained Outcome: Progressing

## 2024-12-08 NOTE — Progress Notes (Signed)
 " Daily Progress Note   Date: 12/08/2024   Patient Name: [name] MOSTAFA Sr.  DOB: [birth year]  MRN: [id number]  Age / Sex: 64 y.o., male  Attending Physician: Cindy Garnette MARLA, MD Primary Care Physician: [name], MD Admit Date: 10/24/2024 Length of Stay: 45 days  Reason for Follow-up: Establishing goals of care, Non pain symptom management, and Terminal Care  Past Medical History:  Diagnosis Date   ESRD on hemodialysis (HCC)    a. HD MWF > 10 years.   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 01/2022 Echo: EF 45-50%, glob HK, mod LVH, mildly reduced RV fxn, mild MR, mod-sev MV Ca2+. Mild AS; b. 10/2023 Echo: EF 45-50%, glob HK, GrII DD, mildly reduced RV fxn, RVSP 52.20mmHg. Mild MR/MS, mod TR.   Hypertension    Nonischemic cardiomyopathy (HCC)    a. 2019 Cath [facility]): Nonobstructive disease; b. 12/2019 Echo: EF 50%; c. 12/2019 MV (Tx w/u [facility]): Ant infarct w/ peri-infarct ischemia. EF 35%; c. 05/2021 MV (Tx w/u [facility]): Cor Ca2+, EF 53%, motion artifact, equiv study, likely low risk.   Nonobstructive coronary artery disease    a.  2019 Cath ([place]): Left main normal, LAD 30 proximal, RCA 30 distal, EF 25%.   Obesity    Peripheral arterial disease    Peripheral vascular disease    Type 2 diabetes mellitus (HCC)    a. 10/2023 - admission w/ HHS.    Assessment & Plan:   HPI/Patient Profile:   64 y.o. male  with past medical history of ESRD on HD, HFrEF (20-25% EF), T2DM, atrial fibrillation admitted on 10/24/2024 with MRSA bacteremia and AHRF 2/2 pneumonia. Per H&P on 10/24/2024 by Pearlean MD, patient presented to [facility] for 2-3 weeks of shortness of breath. CT chest on 10/24/2024 demonstrated multilobar bonchopneumonia. Blood cultures on 10/24/2024 positive for MRSA bacteremia. 10/26/2024 had left thoracentesis with 450 mL transudate fluid removed with negative cultures. Transferred to [facility] 11/02/2024 for management of septic shock and IR evaluation  for [facility] placement which has not been completed due to continued pressor needs.   Patient transitioned to full comfort measures on 11/30/2024 by CCMD. Transferred to hospitalist service on 12/01/2024. In the last 24 hours patient has received atarax  25 mg x2 and oxycodone  10 mg x3.    Palliative medicine consulted for goals of care conversation.  SUMMARY OF RECOMMENDATIONS DNR-comfort, full comfort measures Palliative medicine will continue to follow for symptom management Symptom management as below  Symptom Management:  Oxycodone  for severe pain Dilaudid  for severe pain/dyspnea Ativan  for anxiety, seizure Robinul  for secretions Atarax  for pruritus Benadryl  for pruritus second line  Code Status: DNR - Comfort  Prognosis: < 2 weeks  Discharge Planning: Anticipated Hospital Death  Subjective:   Subjective: Chart Reviewed. Updates received. Patient Assessed. Created space and opportunity for patient  and family to explore thoughts and feelings regarding current medical situation.  Today's Discussion:  Met with patient at bedside without any family present. Patient denied any discomforts. Poor PO intake with meal untouched at bedside.   Review of Systems  All other systems reviewed and are negative.   Objective:   Primary Diagnoses: Present on Admission:  Multifocal pneumonia  Below-knee amputation of right lower extremity (HCC)  Chronic systolic CHF (congestive heart failure) (HCC)  Type 2 diabetes mellitus with diabetic chronic kidney disease (HCC)  (Resolved) Severe sepsis with septic shock  MRSA bacteremia  Hypotension  CAD (coronary artery disease)  Peripheral vascular disease, unspecified   Vital Signs:  BP (!) (P) 91/22 (BP Location: Right Arm)   Pulse 78   Temp (!) 97.4 F (36.3 C) (Axillary)   Resp 17   Ht 6' 1 (1.854 m)   Wt 105.6 kg   SpO2 98%   BMI 30.72 kg/m   Physical Exam Constitutional:      Appearance: He is ill-appearing.  HENT:      Head: Normocephalic and atraumatic.  Eyes:     Extraocular Movements: Extraocular movements intact.  Cardiovascular:     Rate and Rhythm: Normal rate.  Skin:    General: Skin is warm and dry.  Neurological:     General: No focal deficit present.     Mental Status: He is alert.     Palliative Assessment/Data: 30%   Existing Vynca/ACP Documentation: HCPOA - daughter [name] [name]  Thank you for allowing us  to participate in the care of [name] THORINGTON Sr. PMT will continue to support holistically.  I personally spent a total of 25 minutes in the care of the patient today including preparing to see the patient, performing a medically appropriate exam/evaluation, and documenting clinical information in the EHR.  Fairy FORBES Shan DEVONNA  Palliative Medicine Team  Team Phone # [phone] (Nights/Weekends) 12/08/2024 1:51 PM  "

## 2024-12-08 NOTE — Progress Notes (Signed)
 " Progress Note   Patient: [name] DOMANGUE Sr. FMW:[id number] DOB: October 12, 1961 DOA: 10/24/2024     45 DOS: the patient was seen and examined on 12/08/2024   Brief hospital course: 64 y.o. male who has a PMH as outlined below including but not limited to ESRD on HD MWF, A-fib, systolic CHF, hypertension, LBBB, DM.  He presented to [facility] ED on 10/24/2024 with dyspnea x roughly 2-3 weeks. He had been seen in the ED a few days early on 12/2 for dysuria and was started on a course of Cipro  which she started the following day and stated that he had been taking as prescribed.     CT chest that demonstrated multifocal pneumonia and small effusions.  Blood cultures ultimately grew MRSA.  He had been on vancomycin  which was continued. He had a left thora 12/10 with 450cc fluid removed.  His HD catheter was removed on 10/28/2024 as well as an arterial line.  He did have a TTE on 12/11 that showed an EF of 20-25%, G2 DD, mild decrease in RV SF, severe AAS, no vegetations.  TEE was recommended but had been deferred due to patient requiring vasopressor support.  ID was consulted and recommended line holiday.  He had a temporary femoral HD cath placed on 12/15.  ID recommendations included 6 weeks of vancomycin  to end on 12/09/2024.    12/17, he was transferred to [facility] for ongoing management for septic shock as well as for IR evaluation for tunneled HD catheter placement   Significant Hospital Events:  12/8 admit 12/10 L thora with 450cc removed, transudate, neg cultures 12/11 echo 12/12 HD cath and art line removed 12/15 new femoral HD cath placed 12/17 transfer to [facility] 8M 12/19 HD  12/20 CRRT 12/23 Weaned off levophed  this am. HD trial this am. Transferred to [facility] 12/25 Returned to ICU for hypotension 12/28-remains on Levophed  12/29-remains on Levophed , tolerating dialysis 12/31 was off Levophed  this morning 1/2 on Levophed  and had HD 1/4 changed to neo and vaso, stopped levophed  1/5 HD 1/6 CRRT and  GOC discussion w/ pt's daughter  1/14 CRRT discontinued. Plan to transition to comfort care  Assessment and Plan: GOC Palliative following. Discussion w/ pt and pt's daughter who is POA. PMT and CCM expressed concern to both how pt may not tolerate HD outpatient. See 1/6 IPAL note -Patient states will follow daughter's decision -He is aware that if he is unable to tolerate HD then he will die -After family discussion, pt was transitioned to comfort care 11/30/24. CRRT discontinued and vasopressor weaned off - Continue comfort care; currently has not been requiring any as needed medications -Anticipating hospital death   Chronic hypotension requiring vasopressor support No evidence of worsening infection driving hypotension.  Not in heart failure.  Suspect related to poor cardiac function in setting of renal disease - Has been trialed on midodrine  and Strattera  which have now been discontinued in setting of comfort care - s/p CRRT with vasopressor dependence; now being transitioned off dialysis and vasopressor support to comfort care   MRSA bacteremia - s/p vanc; was to complete extended course to 1/23, but stopped in setting of comfort care - Unable to complete TEE at this time - 12/12 Bcx negative, 12/18 Bcx negative - 1/5 Bcx NGTD   Elevated LFTs Elevated Bili Slowly improving with stable bilirubin. Denies abd pain and NT on abd exam. Afebrile. UQ US  w/ single mobile gallstone.  - continue comfort care    Afib w/  RVR - Currently rate controled - continue comfort care    Heart failure with reduced ejection fraction of 20 to 25% Severe aortic stenosis - Not on GDMT due to hypotension  - continue comfort care    End-stage renal disease on hemodialysis Hypervolemic with 3rd spacing - continue comfort care  - No further dialysis   Type 2 diabetes CBG goal 140-180. Poor appetite so insulin  d/c.  - continue comfort care    Peripheral vascular disease - Aspirin  discontinued in  setting of comfort care   Depressed mood Poor appetite - continue comfort care      Subjective: No complaints this AM  Physical Exam: Vitals:   12/06/24 0500 12/07/24 0426 12/07/24 1105 12/08/24 0440  BP: (!) 87/32 (!) 96/52 (!) 89/69 (!) (P) 91/22  Pulse: (!) 168 82 87 78  Resp: 16  18 17   Temp: 97.8 F (36.6 C) (!) 97.5 F (36.4 C) (!) 97.5 F (36.4 C) (!) 97.4 F (36.3 C)  TempSrc: Oral Oral Oral Axillary  SpO2: 100% 95% 98% 98%  Weight:      Height:       General exam: Conversant, in no acute distress Respiratory system: normal chest rise, clear, no audible wheezing Cardiovascular system: regular rhythm, s1-s2 Gastrointestinal system: Nondistended, nontender, pos BS Central nervous system: No seizures, no tremors Extremities: No cyanosis, no joint deformities Skin: No rashes, no pallor Psychiatry: Affect normal // no auditory hallucinations   Data Reviewed:  There are no new results to review at this time.  Family Communication: Pt in room, family not at bedside  Disposition: Status is: Inpatient Remains inpatient appropriate because: severity of illness  Planned Discharge Destination: Hospital death    Author: Garnette Pelt, MD 12/08/2024 4:09 PM  For on call review [email or web address].  "

## 2024-12-09 DIAGNOSIS — Z66 Do not resuscitate: Secondary | ICD-10-CM | POA: Diagnosis not present

## 2024-12-09 DIAGNOSIS — I2510 Atherosclerotic heart disease of native coronary artery without angina pectoris: Secondary | ICD-10-CM | POA: Diagnosis not present

## 2024-12-09 DIAGNOSIS — I959 Hypotension, unspecified: Secondary | ICD-10-CM | POA: Diagnosis not present

## 2024-12-09 DIAGNOSIS — I5022 Chronic systolic (congestive) heart failure: Secondary | ICD-10-CM | POA: Diagnosis not present

## 2024-12-09 DIAGNOSIS — E1122 Type 2 diabetes mellitus with diabetic chronic kidney disease: Secondary | ICD-10-CM | POA: Diagnosis not present

## 2024-12-09 DIAGNOSIS — N189 Chronic kidney disease, unspecified: Secondary | ICD-10-CM | POA: Diagnosis not present

## 2024-12-09 DIAGNOSIS — Z515 Encounter for palliative care: Secondary | ICD-10-CM | POA: Diagnosis not present

## 2024-12-09 DIAGNOSIS — N186 End stage renal disease: Secondary | ICD-10-CM | POA: Diagnosis not present

## 2024-12-09 DIAGNOSIS — I739 Peripheral vascular disease, unspecified: Secondary | ICD-10-CM | POA: Diagnosis not present

## 2024-12-09 DIAGNOSIS — J189 Pneumonia, unspecified organism: Secondary | ICD-10-CM | POA: Diagnosis not present

## 2024-12-09 DIAGNOSIS — J188 Other pneumonia, unspecified organism: Secondary | ICD-10-CM | POA: Diagnosis not present

## 2024-12-09 NOTE — Progress Notes (Signed)
 " Progress Note   Patient: [name] BOER Sr. FMW:[id number] DOB: 11/05/1961 DOA: 10/24/2024     63 DOS: the patient was seen and examined on 12/09/2024   Brief hospital course: 64 y.o. male who has a PMH as outlined below including but not limited to ESRD on HD MWF, A-fib, systolic CHF, hypertension, LBBB, DM.  He presented to [facility] ED on 10/24/2024 with dyspnea x roughly 2-3 weeks. He had been seen in the ED a few days early on 12/2 for dysuria and was started on a course of Cipro  which she started the following day and stated that he had been taking as prescribed.     CT chest that demonstrated multifocal pneumonia and small effusions.  Blood cultures ultimately grew MRSA.  He had been on vancomycin  which was continued. He had a left thora 12/10 with 450cc fluid removed.  His HD catheter was removed on 10/28/2024 as well as an arterial line.  He did have a TTE on 12/11 that showed an EF of 20-25%, G2 DD, mild decrease in RV SF, severe AAS, no vegetations.  TEE was recommended but had been deferred due to patient requiring vasopressor support.  ID was consulted and recommended line holiday.  He had a temporary femoral HD cath placed on 12/15.  ID recommendations included 6 weeks of vancomycin  to end on 12/09/2024.    12/17, he was transferred to [facility] for ongoing management for septic shock as well as for IR evaluation for tunneled HD catheter placement   Significant Hospital Events:  12/8 admit 12/10 L thora with 450cc removed, transudate, neg cultures 12/11 echo 12/12 HD cath and art line removed 12/15 new femoral HD cath placed 12/17 transfer to [facility] 63M 12/19 HD  12/20 CRRT 12/23 Weaned off levophed  this am. HD trial this am. Transferred to [facility] 12/25 Returned to ICU for hypotension 12/28-remains on Levophed  12/29-remains on Levophed , tolerating dialysis 12/31 was off Levophed  this morning 1/2 on Levophed  and had HD 1/4 changed to neo and vaso, stopped levophed  1/5 HD 1/6 CRRT and  GOC discussion w/ pt's daughter  1/14 CRRT discontinued. Plan to transition to comfort care  Assessment and Plan: GOC Palliative following. Discussion w/ pt and pt's daughter who is POA. PMT and CCM expressed concern to both how pt may not tolerate HD outpatient. See 1/6 IPAL note -Patient states will follow daughter's decision -He is aware that if he is unable to tolerate HD then he will die -After family discussion, pt was transitioned to comfort care 11/30/24. CRRT discontinued and vasopressor weaned off - Continue comfort care; currently has not been requiring any as needed medications -Plan to anticipate hospital death   Chronic hypotension requiring vasopressor support No evidence of worsening infection driving hypotension.  Not in heart failure.  Suspect related to poor cardiac function in setting of renal disease - Has been trialed on midodrine  and Strattera  which have now been discontinued in setting of comfort care - s/p CRRT with vasopressor dependence; now being transitioned off dialysis and vasopressor support to comfort care   MRSA bacteremia - s/p vanc; was to complete extended course to 1/23, but stopped in setting of comfort care - Unable to complete TEE at this time - 12/12 Bcx negative, 12/18 Bcx negative - 1/5 Bcx NGTD   Elevated LFTs Elevated Bili Slowly improving with stable bilirubin. Denies abd pain and NT on abd exam. Afebrile. UQ US  w/ single mobile gallstone.  - continue comfort care  Afib w/ RVR - Currently rate controled - continue comfort care    Heart failure with reduced ejection fraction of 20 to 25% Severe aortic stenosis - Not on GDMT due to hypotension  - continue comfort care    End-stage renal disease on hemodialysis Hypervolemic with 3rd spacing - continue comfort care  - No further dialysis   Type 2 diabetes CBG goal 140-180. Poor appetite so insulin  d/c.  - continue comfort care    Peripheral vascular disease - Aspirin   discontinued in setting of comfort care   Depressed mood Poor appetite - continue comfort care      Subjective:This AM feeling hungry. No breakfast tray yet when seen  Physical Exam: Vitals:   12/06/24 0500 12/07/24 0426 12/07/24 1105 12/08/24 0440  BP: (!) 87/32 (!) 96/52 (!) 89/69 (!) (P) 91/22  Pulse: (!) 168 82 87 78  Resp: 16  18 17   Temp:  (!) 97.5 F (36.4 C) (!) 97.5 F (36.4 C) (!) 97.4 F (36.3 C)  TempSrc: Oral Oral Oral Axillary  SpO2: 100% 95% 98% 98%  Weight:      Height:       General exam: Awake, laying in bed, in nad Respiratory system: Normal respiratory effort, no wheezing Cardiovascular system: regular rate, s1, s2 Gastrointestinal system: Soft, nondistended, positive BS Central nervous system: CN2-12 grossly intact, strength intact Extremities: Perfused, no clubbing Skin: Normal skin turgor, no notable skin lesions seen Psychiatry: Mood normal // no visual hallucinations   Data Reviewed:  There are no new results to review at this time.  Family Communication: Pt in room, family not at bedside  Disposition: Status is: Inpatient Remains inpatient appropriate because: severity of illness  Planned Discharge Destination: Hospital death    Author: Garnette Pelt, MD 12/09/2024 2:22 PM  For on call review [email or web address]  "

## 2024-12-09 NOTE — Plan of Care (Signed)
  Problem: Pain Managment: Goal: General experience of comfort will improve and/or be controlled Outcome: Progressing   Problem: Safety: Goal: Ability to remain free from injury will improve Outcome: Progressing   Problem: Skin Integrity: Goal: Risk for impaired skin integrity will decrease Outcome: Progressing

## 2024-12-09 NOTE — Progress Notes (Addendum)
 " Daily Progress Note   Date: 12/09/2024   Patient Name: [name] VIGO Sr.  DOB: April 25, 1961  MRN: [id number]  Age / Sex: 64 y.o., male  Attending Physician: Cindy Garnette MARLA, MD Primary Care Physician: [name], MD Admit Date: 10/24/2024 Length of Stay: 46 days  Reason for Follow-up: Establishing goals of care  Past Medical History:  Diagnosis Date   ESRD on hemodialysis [facility])    a. HD MWF > 10 years.   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 01/2022 Echo: EF 45-50%, glob HK, mod LVH, mildly reduced RV fxn, mild MR, mod-sev MV Ca2+. Mild AS; b. 10/2023 Echo: EF 45-50%, glob HK, GrII DD, mildly reduced RV fxn, RVSP 52.73mmHg. Mild MR/MS, mod TR.   Hypertension    Nonischemic cardiomyopathy (HCC)    a. 2019 Cath [facility]): Nonobstructive disease; b. 12/2019 Echo: EF 50%; c. 12/2019 MV (Tx w/u [facility]): Ant infarct w/ peri-infarct ischemia. EF 35%; c. 05/2021 MV (Tx w/u [facility]): Cor Ca2+, EF 53%, motion artifact, equiv study, likely low risk.   Nonobstructive coronary artery disease    a.  2019 Cath ([place] [facility]): Left main normal, LAD 30 proximal, RCA 30 distal, EF 25%.   Obesity    Peripheral arterial disease    Peripheral vascular disease    Type 2 diabetes mellitus (HCC)    a. 10/2023 - admission w/ HHS.    Assessment & Plan:   HPI/Patient Profile:   64 y.o. male  with past medical history of ESRD on HD, HFrEF (20-25% EF), T2DM, atrial fibrillation admitted on 10/24/2024 with MRSA bacteremia and AHRF 2/2 pneumonia. Per H&P on 10/24/2024 by Pearlean MD, patient presented to [facility] for 2-3 weeks of shortness of breath. CT chest on 10/24/2024 demonstrated multilobar bonchopneumonia. Blood cultures on 10/24/2024 positive for MRSA bacteremia. 10/26/2024 had left thoracentesis with 450 mL transudate fluid removed with negative cultures. Transferred to [facility] 11/02/2024 for management of septic shock and IR evaluation for [facility] placement which has not been completed  due to continued pressor needs.    Patient transitioned to full comfort measures on 11/30/2024 by CCMD. Transferred to hospitalist service on 12/01/2024. Received dilaudid  1 mg PO, atarax  25 mg po x2, and oxycodone  10 mg PO x1 in the last 24 hours.    Palliative medicine consulted for goals of care conversation.  SUMMARY OF RECOMMENDATIONS DNR comfort Palliative medicine will continue to follow for symptom management Symptom management as below  Symptom Management:  Oxycodone  for severe pain Dilaudid  for severe pain/dyspnea Ativan  for anxiety, seizure Robinul  for secretions Atarax  for pruritus Benadryl  for pruritus second line  Code Status: DNR - Comfort  Prognosis: < 2 weeks  Discharge Planning: Anticipated Hospital Death  Subjective:   Subjective: Chart Reviewed. Updates received. Patient Assessed. Created space and opportunity for patient  and family to explore thoughts and feelings regarding current medical situation.  Today's Discussion:  Met with the patient bedside today without any visitors present.  Patient denied any discomforts at this time.  Shared that he has received Benadryl  for itchiness recently.  Continues to have poor appetite and poor intake.  Review of Systems  Constitutional:  Positive for appetite change and fatigue.    Objective:   Primary Diagnoses: Present on Admission:  Multifocal pneumonia  Below-knee amputation of right lower extremity (HCC)  Chronic systolic CHF (congestive heart failure) (HCC)  Type 2 diabetes mellitus with diabetic chronic kidney disease (HCC)  (Resolved) Severe sepsis with  septic shock  MRSA bacteremia  Hypotension  CAD (coronary artery disease)  Peripheral vascular disease, unspecified   Vital Signs:  BP (!) (P) 91/22 (BP Location: Right Arm)   Pulse 78   Temp (!) 97.4 F (36.3 C) (Axillary)   Resp 17   Ht 6' 1 (1.854 m)   Wt 105.6 kg   SpO2 98%   BMI 30.72 kg/m   Physical Exam Constitutional:       Appearance: He is ill-appearing.  HENT:     Head: Normocephalic and atraumatic.  Eyes:     Extraocular Movements: Extraocular movements intact.  Pulmonary:     Effort: Pulmonary effort is normal.  Musculoskeletal:     Right lower leg: Edema present.     Left lower leg: Edema present.  Skin:    General: Skin is warm and dry.  Neurological:     General: No focal deficit present.     Mental Status: He is alert.  Psychiatric:        Mood and Affect: Mood normal.     Palliative Assessment/Data: 30%   Existing Vynca/ACP Documentation: HCPOA -daughter [name] [name]  Thank you for allowing us  to participate in the care of [name] ROZELLE Sr. PMT will continue to support holistically.  I personally spent a total of 25 minutes in the care of the patient today including preparing to see the patient and documenting clinical information in the EHR.   Fairy FORBES Shan DEVONNA  Palliative Medicine Team  Team Phone # [phone] (Nights/Weekends) 12/09/2024 5:13 PM  "

## 2024-12-10 DIAGNOSIS — Z515 Encounter for palliative care: Secondary | ICD-10-CM | POA: Diagnosis not present

## 2024-12-10 DIAGNOSIS — I959 Hypotension, unspecified: Secondary | ICD-10-CM | POA: Diagnosis not present

## 2024-12-10 DIAGNOSIS — J188 Other pneumonia, unspecified organism: Secondary | ICD-10-CM | POA: Diagnosis not present

## 2024-12-10 DIAGNOSIS — I5020 Unspecified systolic (congestive) heart failure: Secondary | ICD-10-CM | POA: Diagnosis not present

## 2024-12-10 DIAGNOSIS — Z992 Dependence on renal dialysis: Secondary | ICD-10-CM | POA: Diagnosis not present

## 2024-12-10 DIAGNOSIS — Z66 Do not resuscitate: Secondary | ICD-10-CM | POA: Diagnosis not present

## 2024-12-10 DIAGNOSIS — I4891 Unspecified atrial fibrillation: Secondary | ICD-10-CM | POA: Diagnosis not present

## 2024-12-10 DIAGNOSIS — J9600 Acute respiratory failure, unspecified whether with hypoxia or hypercapnia: Secondary | ICD-10-CM | POA: Diagnosis not present

## 2024-12-10 DIAGNOSIS — R7881 Bacteremia: Secondary | ICD-10-CM | POA: Diagnosis not present

## 2024-12-10 DIAGNOSIS — N186 End stage renal disease: Secondary | ICD-10-CM | POA: Diagnosis not present

## 2024-12-10 NOTE — Plan of Care (Signed)
  Problem: Pain Managment: Goal: General experience of comfort will improve and/or be controlled Outcome: Progressing   Problem: Safety: Goal: Ability to remain free from injury will improve Outcome: Progressing

## 2024-12-10 NOTE — Progress Notes (Signed)
 " Progress Note   Patient: [name] SHATTO Sr. FMW:[id number] DOB: 1960/12/17 DOA: 10/24/2024     47 DOS: the patient was seen and examined on 12/10/2024   Brief hospital course: 64 y.o. male who has a PMH as outlined below including but not limited to ESRD on HD MWF, A-fib, systolic CHF, hypertension, LBBB, DM.  He presented to [facility] ED on 10/24/2024 with dyspnea x roughly 2-3 weeks. He had been seen in the ED a few days early on 12/2 for dysuria and was started on a course of Cipro  which she started the following day and stated that he had been taking as prescribed.     CT chest that demonstrated multifocal pneumonia and small effusions.  Blood cultures ultimately grew MRSA.  He had been on vancomycin  which was continued. He had a left thora 12/10 with 450cc fluid removed.  His HD catheter was removed on 10/28/2024 as well as an arterial line.  He did have a TTE on 12/11 that showed an EF of 20-25%, G2 DD, mild decrease in RV SF, severe AAS, no vegetations.  TEE was recommended but had been deferred due to patient requiring vasopressor support.  ID was consulted and recommended line holiday.  He had a temporary femoral HD cath placed on 12/15.  ID recommendations included 6 weeks of vancomycin  to end on 12/09/2024.    12/17, he was transferred to [facility] for ongoing management for septic shock as well as for IR evaluation for tunneled HD catheter placement   Significant Hospital Events:  12/8 admit 12/10 L thora with 450cc removed, transudate, neg cultures 12/11 echo 12/12 HD cath and art line removed 12/15 new femoral HD cath placed 12/17 transfer to [facility] 2M 12/19 HD  12/20 CRRT 12/23 Weaned off levophed  this am. HD trial this am. Transferred to [facility] 12/25 Returned to ICU for hypotension 12/28-remains on Levophed  12/29-remains on Levophed , tolerating dialysis 12/31 was off Levophed  this morning 1/2 on Levophed  and had HD 1/4 changed to neo and vaso, stopped levophed  1/5 HD 1/6 CRRT and  GOC discussion w/ pt's daughter  1/14 CRRT discontinued. Plan to transition to comfort care  Assessment and Plan: GOC Palliative following. Discussion w/ pt and pt's daughter who is POA. PMT and CCM expressed concern to both how pt may not tolerate HD outpatient. See 1/6 IPAL note -Patient states will follow daughter's decision -He is aware that if he is unable to tolerate HD then he will die -After family discussion, pt was transitioned to comfort care 11/30/24. CRRT discontinued and vasopressor weaned off - Continue comfort care; currently has not been requiring any as needed medications -anticipating hospital death   Chronic hypotension requiring vasopressor support No evidence of worsening infection driving hypotension.  Not in heart failure.  Suspect related to poor cardiac function in setting of renal disease - Has been trialed on midodrine  and Strattera  which have now been discontinued in setting of comfort care - s/p CRRT with vasopressor dependence; now being transitioned off dialysis and vasopressor support to comfort care   MRSA bacteremia - s/p vanc; was to complete extended course to 1/23, but stopped in setting of comfort care - Unable to complete TEE at this time - 12/12 Bcx negative, 12/18 Bcx negative - 1/5 Bcx NGTD   Elevated LFTs Elevated Bili Slowly improving with stable bilirubin. Denies abd pain and NT on abd exam. Afebrile. UQ US  w/ single mobile gallstone.  - continue comfort care    Afib w/  RVR - Currently rate controled - continue comfort care    Heart failure with reduced ejection fraction of 20 to 25% Severe aortic stenosis - Not on GDMT due to hypotension  - continue comfort care    End-stage renal disease on hemodialysis Hypervolemic with 3rd spacing - continue comfort care  - No further dialysis   Type 2 diabetes CBG goal 140-180. Poor appetite so insulin  d/c.  - continue comfort care    Peripheral vascular disease - Aspirin  discontinued in  setting of comfort care   Depressed mood Poor appetite - continue comfort care      Subjective: Without complaints  Physical Exam: Vitals:   12/07/24 0426 12/07/24 1105 12/08/24 0440 12/10/24 0512  BP: (!) 96/52 (!) 89/69 (!) (P) 91/22 (!) 122/107  Pulse: 82 87 78 86  Resp:  18 17   Temp: (!) 97.5 F (36.4 C) (!) 97.5 F (36.4 C) (!) 97.4 F (36.3 C) 98.1 F (36.7 C)  TempSrc: Oral Oral Axillary Oral  SpO2: 95% 98% 98% 97%  Weight:      Height:       General exam: Conversant, in no acute distress Respiratory system: normal chest rise, clear, no audible wheezing Cardiovascular system: regular rhythm, s1-s2 Gastrointestinal system: Nondistended, nontender, pos BS Central nervous system: No seizures, no tremors Extremities: No cyanosis, no joint deformities Skin: No rashes, no pallor Psychiatry: Affect normal // no auditory hallucinations   Data Reviewed:  There are no new results to review at this time.  Family Communication: Pt in room, family not at bedside  Disposition: Status is: Inpatient Remains inpatient appropriate because: severity of illness  Planned Discharge Destination: Hospital death    Author: Garnette Pelt, MD 12/10/2024 3:21 PM  For on call review [email or web address].  "

## 2024-12-10 NOTE — Progress Notes (Addendum)
 "  Palliative Medicine Inpatient Follow Up Note   HPI: 64 year old male with past medical history of ESRD on hemodialysis, HFrEF 20 to 25% EF, type 2 diabetes, atrial fibrillation admitted on 10/24/2024 with MRSA bacteremia and AH RF secondary to pneumonia.  Patient transitioned to full comfort measures only on 11/30/2024 by CCMD.  This is hospital day #: 85 Today's Discussion 12/10/2024  I have reviewed medical records including:  EPIC notes: Reviewed  PMT Fairy Kemps progress note from 12/09/2024 detailing plan of care for DNR comfort.  Anticipating hospital death. vital signs: Reviewed recent vital signs from 12/10/2024 at 0 5:12 AM: 98.1 F, 122/107, 86, 17, 19% on room air. MAR: Reviewed PRN meds received over the last 24 hours.  Received as needed oxycodone  x 1 dose.  Reviewed to assess needs for medication adjustment to optimize comfort.  Available advanced directives in ACP: Patient has HCPOA and living will documents uploaded on EHR. Labs: No recent lab works as patient is on comfort focused pathway.  Met with patient to perform assessment and symptom check.   Visited patient at bedside with family member present. The patient was awake, alert, and pleasant in demeanor, and in no acute distress. Breathing spontaneously on room air without difficulty. No verbal or nonverbal indicators of pain or discomfort noted. Patient was able to make needs known, requested for a cup of water. Then went back to sleep.  Reports continued poor oral intake.   Coordinated plan of care with primary RN. RN reports patient has been calm and cooperative. No reports of pain. Reports feeling itchy earlier, resolved shortly after administration of PRN benadryl .  Goals of care discussion reviewed with patient. They expressed understanding and are at peace with the decision to pursue a comfort-focused approach as this aligns with the patient's wishes to optimize the patient's quality of life through emphasis on symptom  management and relief of pain and discomfort.  Patient educated/reminded to let RN know if he is in pain or discomfort so medicines can be given to provide symptom relief.   The expected disease trajectory and natural progression after stopping dialysis were reviewed, including anticipated symptoms, comfort focused management, and the PMT/hospice team's role in supporting the patient and family.  The patient/family verbalized understanding, emphasizing symptom relief and quality of life.   Questions and concerns addressed   Palliative Support Provided.   Objective Assessment: Vital Signs Vitals:   12/08/24 0440 12/10/24 0512  BP: (!) (P) 91/22 (!) 122/107  Pulse: 78 86  Resp: 17   Temp:  98.1 F (36.7 C)  SpO2: 98% 97%   No intake or output data in the 24 hours ending 12/10/24 0933 Last Weight  Most recent update: 12/01/2024  5:27 AM    Weight  105.6 kg (232 lb 13.2 oz)             Physical Exam Vitals and nursing note reviewed.  Constitutional:      Appearance: He is ill-appearing.     Comments: On comfort focused pathway.   HENT:     Head: Normocephalic and atraumatic.     Mouth/Throat:     Mouth: Mucous membranes are moist.  Cardiovascular:     Rate and Rhythm: Normal rate.  Pulmonary:     Effort: Pulmonary effort is normal.  Abdominal:     General: Abdomen is flat. Bowel sounds are normal.     Palpations: Abdomen is soft.  Musculoskeletal:     Comments: Generalized weakness  Skin:  General: Skin is warm.  Neurological:     Mental Status: He is alert. Mental status is at baseline.  Psychiatric:        Mood and Affect: Mood normal.     SUMMARY OF RECOMMENDATIONS    # Complex medical decision-making/goals of care  Code Status: Maintain DNR comfort Palliative medicine will continue to follow for symptom management Anticipate hospital death. Comfort cart for family Unrestricted visitations in the setting of EOL (per policy)  Symptom  Management:  Oxycodone  as needed for severe pain Acetaminophen  as needed for mild pain Dilaudid  as needed for severe pain/dyspnea, air hunger/discomfort Robinul  PRN for excessive secretions Ativan  PRN for agitation/anxiety Hydroxyzine  as needed for itching Benadryl  as needed for itching Artificial tears PRN for dry eyes Haldol PRN for agitation/anxiety May have comfort feeding Oxygen PRN 2L or less for comfort. No escalation.    # Psychosocial support Continue to provide psycho-social and emotional support to patient and family.  # Discharge planning  Anticipate hospital death  # Prognosis  Prognosis is poor.  Patient is on comfort focused pathway   # Treatment plan discussed with: Patient, nursing staff, and medical team.  I personally spent a total of 35 minutes in the care of the patient today including preparing to see the patient, getting/reviewing separately obtained history, performing a medically appropriate exam/evaluation, documenting clinical information in the EHR, and coordinating care.    Thank you for allowing us  to participate in the care of [name] [name]   ______________________________________________________________________________________ Kathlyne Bolder NP-C [employer] Palliative Medicine Team Team Cell Phone: [phone] Please utilize secure chat with additional questions, if there is no response within 30 minutes please call the above phone number  Palliative Medicine Team providers are available by phone from 7am to 7pm daily and can be reached through the team cell phone.  Should this patient require assistance outside of these hours, please call the patient's attending physician.     "

## 2024-12-10 NOTE — Plan of Care (Signed)
" °  Problem: Clinical Measurements: Goal: Ability to maintain clinical measurements within normal limits will improve Outcome: Progressing Goal: Will remain free from infection Outcome: Progressing Goal: Diagnostic test results will improve Outcome: Progressing   Problem: Safety: Goal: Ability to remain free from injury will improve Outcome: Progressing   Problem: Skin Integrity: Goal: Risk for impaired skin integrity will decrease Outcome: Progressing   Problem: Clinical Measurements: Goal: Ability to maintain a body temperature in the normal range will improve Outcome: Progressing   Problem: Coping: Goal: Ability to adjust to condition or change in health will improve Outcome: Progressing   "

## 2024-12-11 DIAGNOSIS — N186 End stage renal disease: Secondary | ICD-10-CM | POA: Diagnosis not present

## 2024-12-11 DIAGNOSIS — Z66 Do not resuscitate: Secondary | ICD-10-CM | POA: Diagnosis not present

## 2024-12-11 DIAGNOSIS — I4891 Unspecified atrial fibrillation: Secondary | ICD-10-CM | POA: Diagnosis not present

## 2024-12-11 DIAGNOSIS — Z992 Dependence on renal dialysis: Secondary | ICD-10-CM | POA: Diagnosis not present

## 2024-12-11 DIAGNOSIS — R7881 Bacteremia: Secondary | ICD-10-CM | POA: Diagnosis not present

## 2024-12-11 DIAGNOSIS — I959 Hypotension, unspecified: Secondary | ICD-10-CM | POA: Diagnosis not present

## 2024-12-11 DIAGNOSIS — Z515 Encounter for palliative care: Secondary | ICD-10-CM | POA: Diagnosis not present

## 2024-12-11 DIAGNOSIS — J188 Other pneumonia, unspecified organism: Secondary | ICD-10-CM | POA: Diagnosis not present

## 2024-12-11 DIAGNOSIS — I5020 Unspecified systolic (congestive) heart failure: Secondary | ICD-10-CM | POA: Diagnosis not present

## 2024-12-11 DIAGNOSIS — J9600 Acute respiratory failure, unspecified whether with hypoxia or hypercapnia: Secondary | ICD-10-CM | POA: Diagnosis not present

## 2024-12-11 MED ORDER — HYDROXYZINE HCL 25 MG PO TABS
25.0000 mg | ORAL_TABLET | Freq: Two times a day (BID) | ORAL | Status: DC
  Administered 2024-12-11 – 2024-12-12 (×2): 25 mg via ORAL
  Filled 2024-12-11 (×2): qty 1

## 2024-12-11 NOTE — Progress Notes (Signed)
 " Progress Note   Patient: [name] MISNER Sr. FMW:[id number] DOB: May 13, 1961 DOA: 10/24/2024     63 DOS: the patient was seen and examined on 12/11/2024   Brief hospital course: 64 y.o. male who has a PMH as outlined below including but not limited to ESRD on HD MWF, A-fib, systolic CHF, hypertension, LBBB, DM.  He presented to [facility] ED on 10/24/2024 with dyspnea x roughly 2-3 weeks. He had been seen in the ED a few days early on 12/2 for dysuria and was started on a course of Cipro  which she started the following day and stated that he had been taking as prescribed.     CT chest that demonstrated multifocal pneumonia and small effusions.  Blood cultures ultimately grew MRSA.  He had been on vancomycin  which was continued. He had a left thora 12/10 with 450cc fluid removed.  His HD catheter was removed on 10/28/2024 as well as an arterial line.  He did have a TTE on 12/11 that showed an EF of 20-25%, G2 DD, mild decrease in RV SF, severe AAS, no vegetations.  TEE was recommended but had been deferred due to patient requiring vasopressor support.  ID was consulted and recommended line holiday.  He had a temporary femoral HD cath placed on 12/15.  ID recommendations included 6 weeks of vancomycin  to end on 12/09/2024.    12/17, he was transferred to [facility] for ongoing management for septic shock as well as for IR evaluation for tunneled HD catheter placement   Significant Hospital Events:  12/8 admit 12/10 L thora with 450cc removed, transudate, neg cultures 12/11 echo 12/12 HD cath and art line removed 12/15 new femoral HD cath placed 12/17 transfer to [facility] 5M 12/19 HD  12/20 CRRT 12/23 Weaned off levophed  this am. HD trial this am. Transferred to [facility] 12/25 Returned to ICU for hypotension 12/28-remains on Levophed  12/29-remains on Levophed , tolerating dialysis 12/31 was off Levophed  this morning 1/2 on Levophed  and had HD 1/4 changed to neo and vaso, stopped levophed  1/5 HD 1/6 CRRT and  GOC discussion w/ pt's daughter  1/14 CRRT discontinued. Plan to transition to comfort care  Assessment and Plan: GOC Palliative following. Discussion w/ pt and pt's daughter who is POA. PMT and CCM expressed concern to both how pt may not tolerate HD outpatient. See 1/6 IPAL note -Patient states will follow daughter's decision -He is aware that if he is unable to tolerate HD then he will die -After family discussion, pt was transitioned to comfort care 11/30/24. CRRT discontinued and vasopressor weaned off - Continue comfort care; currently has not been requiring any as needed medications -Currently anticipating hospital death   Chronic hypotension requiring vasopressor support No evidence of worsening infection driving hypotension.  Not in heart failure.  Suspect related to poor cardiac function in setting of renal disease - Has been trialed on midodrine  and Strattera  which have now been discontinued in setting of comfort care - s/p CRRT with vasopressor dependence; now being transitioned off dialysis and vasopressor support to comfort care   MRSA bacteremia - s/p vanc; was to complete extended course to 1/23, but stopped in setting of comfort care - Unable to complete TEE at this time - 12/12 Bcx negative, 12/18 Bcx negative - 1/5 Bcx NGTD   Elevated LFTs Elevated Bili Slowly improving with stable bilirubin. Denies abd pain and NT on abd exam. Afebrile. UQ US  w/ single mobile gallstone.  - continue comfort care    Afib  w/ RVR - Currently rate controled - continue comfort care    Heart failure with reduced ejection fraction of 20 to 25% Severe aortic stenosis - Not on GDMT due to hypotension  - continue comfort care    End-stage renal disease on hemodialysis Hypervolemic with 3rd spacing - continue comfort care  - No further dialysis   Type 2 diabetes CBG goal 140-180. Poor appetite so insulin  d/c.  - continue comfort care    Peripheral vascular disease - Aspirin   discontinued in setting of comfort care   Depressed mood Poor appetite - continue comfort care      Subjective: No complaints this AM  Physical Exam: Vitals:   12/07/24 1105 12/08/24 0440 12/10/24 0512 12/11/24 0500  BP: (!) 89/69 (!) (P) 91/22 (!) 122/107 (!) 92/27  Pulse: 87 78 86 85  Resp: 18 17    Temp:  (!) 97.4 F (36.3 C) 98.1 F (36.7 C) 98.6 F (37 C)  TempSrc: Oral Axillary Oral   SpO2: 98% 98% 97% 93%  Weight:      Height:       General exam: Awake, laying in bed, in nad Respiratory system: Normal respiratory effort, no wheezing Cardiovascular system: regular rate, s1, s2 Gastrointestinal system: Soft, nondistended, positive BS Central nervous system: CN2-12 grossly intact, strength intact Extremities: Perfused, no clubbing Skin: Normal skin turgor, no notable skin lesions seen Psychiatry: Mood normal // affect appears normal  Data Reviewed:  There are no new results to review at this time.  Family Communication: Pt in room, family not at bedside  Disposition: Status is: Inpatient Remains inpatient appropriate because: severity of illness  Planned Discharge Destination: Hospital death    Author: Garnette Pelt, MD 12/11/2024 1:46 PM  For on call review [email or web address].  "

## 2024-12-11 NOTE — Progress Notes (Addendum)
 "  Palliative Medicine Inpatient Follow Up Note   HPI: 64 year old male with past medical history of ESRD on hemodialysis, HFrEF 20 to 25% EF, type 2 diabetes, atrial fibrillation admitted on 10/24/2024 with MRSA bacteremia and AH RF secondary to pneumonia.   Patient transitioned to full comfort measures only on 11/30/2024 by CCMD.    This is hospital day #: 63 Today's Discussion 12/11/2024  I have reviewed medical records including:  EPIC notes: Reviewed hospitalist Dr. Cindy progress note from 12/10/24, patient on comfort measures only pathway.  Vital signs: Reviewed from 12/11/24 at 0500: Temperature 98.6 F, blood pressure 92/27 mmHg, pulse rate 85, respiration rate 17, O2 saturation 93% on room air. MAR: Reviewed PRN meds received over the last 24 hours.  Received as needed Benadryl  x 2 doses.  Reviewed to assess needs for medication adjustment to optimize comfort.  Available advanced directives in ACP: Available Labs: No recent labs, patient is on comfort focused pathway  Visited patient at bedside, no family member present.  The patient was awake, alert, and pleasant in demeanor.  No acute distress.  Breathing spontaneously on room air without difficulty.  No verbal or nonverbal indicators of pain or discomfort.  Patient was able to make needs known.  Reports continued poor oral intake. Patient reports increase in frequency of itching, generalized.   Goals of care reviewed with patient.  He expressed understanding and is at peace with the decision to pursue a comfort focused approach as this aligns with his wishes to optimize quality of life (and symptom management and relief of pain and discomfort.  Coordinated plan of care with primary RN.  RN reports no significant events overnight.  Continue with comfort focused pathway.  Questions and concerns addressed   Palliative Support Provided.   Objective Assessment: Vital Signs Vitals:   12/10/24 0512 12/11/24 0500  BP: (!) 122/107 (!)  92/27  Pulse: 86 85  Resp:    Temp: 98.1 F (36.7 C) 98.6 F (37 C)  SpO2: 97% 93%    Intake/Output Summary (Last 24 hours) at 12/11/2024 1148 Last data filed at 12/10/2024 1800 Gross per 24 hour  Intake 720 ml  Output --  Net 720 ml   Last Weight  Most recent update: 12/01/2024  5:27 AM    Weight  105.6 kg (232 lb 13.2 oz)             Physical Exam Constitutional:      Appearance: He is ill-appearing.  HENT:     Head: Normocephalic and atraumatic.     Mouth/Throat:     Mouth: Mucous membranes are moist.  Pulmonary:     Effort: Pulmonary effort is normal.  Abdominal:     General: Abdomen is flat. Bowel sounds are normal.     Palpations: Abdomen is soft.  Musculoskeletal:     Comments: Generalized weakness  Skin:    General: Skin is warm.  Neurological:     Mental Status: He is alert. Mental status is at baseline.  Psychiatric:        Mood and Affect: Mood normal.     SUMMARY OF RECOMMENDATIONS    # Complex medical decision-making/goals of care  Code Status: Continue with DNR comfort PMT will continue to follow for symptom management and support Anticipate hospital death Comfort car for the familyt  Unrestricted visitations in the setting of end-of-life  # Symptom Management:  Oxycodone  as needed for severe pain Acetaminophen  as needed for mild pain Dilaudid  as needed for  severe pain/dyspnea/air hunger/discomfort Robinul  as needed for excessive oral secretions Ativan  as needed for agitation/anxiety Changed Atarax  from PRN to scheduled BID to address itching symptoms.  Benadryl  as needed for itching Artificial tears as needed for dry eyes Haldol as needed for agitation/anxiety May have comfort feeding Oxygen as needed for comfort, no escalation.  # Psychosocial support Continue to provide psycho-social and emotional support to patient and family.  # Discharge planning  Anticipate hospital death  # Prognosis  Prognosis is poor.  Patient is on  comfort focused pathway  # Treatment plan discussed with: Patient, nursing staff, and medical team.  I personally spent a total of 25 minutes in the care of the patient today including preparing to see the patient, getting/reviewing separately obtained history, performing a medically appropriate exam/evaluation, referring and communicating with other health care professionals, documenting clinical information in the EHR, and coordinating care.    Thank you for allowing us  to participate in the care of [name] [name]   ______________________________________________________________________________________ Kathlyne Bolder NP-C  Palliative Medicine Team Team Cell Phone: [phone] Please utilize secure chat with additional questions, if there is no response within 30 minutes please call the above phone number  Palliative Medicine Team providers are available by phone from 7am to 7pm daily and can be reached through the team cell phone.  Should this patient require assistance outside of these hours, please call the patient's attending physician.     "

## 2024-12-11 NOTE — Plan of Care (Signed)
 " Problem: Education: Goal: Knowledge of General Education information will improve Description: Including pain rating scale, medication(s)/side effects and non-pharmacologic comfort measures Outcome: Progressing   Problem: Clinical Measurements: Goal: Ability to maintain clinical measurements within normal limits will improve Outcome: Progressing Goal: Will remain free from infection Outcome: Progressing Goal: Diagnostic test results will improve Outcome: Progressing Goal: Respiratory complications will improve Outcome: Progressing Goal: Cardiovascular complication will be avoided Outcome: Progressing   Problem: Nutrition: Goal: Adequate nutrition will be maintained Outcome: Progressing   Problem: Elimination: Goal: Will not experience complications related to bowel motility Outcome: Progressing   Problem: Pain Managment: Goal: General experience of comfort will improve and/or be controlled Outcome: Progressing   Problem: Safety: Goal: Ability to remain free from injury will improve Outcome: Progressing   Problem: Skin Integrity: Goal: Risk for impaired skin integrity will decrease Outcome: Progressing   Problem: Activity: Goal: Ability to tolerate increased activity will improve Outcome: Progressing   Problem: Clinical Measurements: Goal: Ability to maintain a body temperature in the normal range will improve Outcome: Progressing   Problem: Respiratory: Goal: Ability to maintain adequate ventilation will improve Outcome: Progressing Goal: Ability to maintain a clear airway will improve Outcome: Progressing   Problem: Education: Goal: Ability to describe self-care measures that may prevent or decrease complications (Diabetes Survival Skills Education) will improve Outcome: Progressing Goal: Individualized Educational Video(s) Outcome: Progressing   Problem: Coping: Goal: Ability to adjust to condition or change in health will improve Outcome: Progressing    Problem: Fluid Volume: Goal: Ability to maintain a balanced intake and output will improve Outcome: Progressing   Problem: Health Behavior/Discharge Planning: Goal: Ability to identify and utilize available resources and services will improve Outcome: Progressing Goal: Ability to manage health-related needs will improve Outcome: Progressing   Problem: Metabolic: Goal: Ability to maintain appropriate glucose levels will improve Outcome: Progressing   Problem: Nutritional: Goal: Maintenance of adequate nutrition will improve Outcome: Progressing Goal: Progress toward achieving an optimal weight will improve Outcome: Progressing   Problem: Skin Integrity: Goal: Risk for impaired skin integrity will decrease Outcome: Progressing   Problem: Tissue Perfusion: Goal: Adequacy of tissue perfusion will improve Outcome: Progressing   Problem: Education: Goal: Knowledge of disease and its progression will improve Outcome: Progressing Goal: Individualized Educational Video(s) Outcome: Progressing   Problem: Fluid Volume: Goal: Compliance with measures to maintain balanced fluid volume will improve Outcome: Progressing   Problem: Health Behavior/Discharge Planning: Goal: Ability to manage health-related needs will improve Outcome: Progressing   Problem: Nutritional: Goal: Ability to make healthy dietary choices will improve Outcome: Progressing   Problem: Clinical Measurements: Goal: Complications related to the disease process, condition or treatment will be avoided or minimized Outcome: Progressing   Problem: Education: Goal: Knowledge of disease or condition will improve Outcome: Progressing Goal: Understanding of medication regimen will improve Outcome: Progressing Goal: Individualized Educational Video(s) Outcome: Progressing   Problem: Activity: Goal: Ability to tolerate increased activity will improve Outcome: Progressing   Problem: Cardiac: Goal: Ability to  achieve and maintain adequate cardiopulmonary perfusion will improve Outcome: Progressing   Problem: Health Behavior/Discharge Planning: Goal: Ability to safely manage health-related needs after discharge will improve Outcome: Progressing   Problem: Education: Goal: Ability to demonstrate management of disease process will improve Outcome: Progressing Goal: Ability to verbalize understanding of medication therapies will improve Outcome: Progressing Goal: Individualized Educational Video(s) Outcome: Progressing   Problem: Activity: Goal: Capacity to carry out activities will improve Outcome: Progressing   Problem: Cardiac: Goal: Ability to achieve and  maintain adequate cardiopulmonary perfusion will improve Outcome: Progressing   "

## 2024-12-11 NOTE — Plan of Care (Signed)
" °  Problem: Skin Integrity: Goal: Risk for impaired skin integrity will decrease Outcome: Progressing   Problem: Activity: Goal: Ability to tolerate increased activity will improve Outcome: Progressing   Problem: Activity: Goal: Capacity to carry out activities will improve Outcome: Progressing   "

## 2024-12-12 DIAGNOSIS — N186 End stage renal disease: Secondary | ICD-10-CM | POA: Diagnosis not present

## 2024-12-12 DIAGNOSIS — J188 Other pneumonia, unspecified organism: Secondary | ICD-10-CM | POA: Diagnosis not present

## 2024-12-12 DIAGNOSIS — I959 Hypotension, unspecified: Secondary | ICD-10-CM | POA: Diagnosis not present

## 2024-12-12 MED ORDER — HYDROXYZINE HCL 25 MG PO TABS
25.0000 mg | ORAL_TABLET | Freq: Three times a day (TID) | ORAL | Status: DC
  Administered 2024-12-12 – 2024-12-13 (×5): 25 mg via ORAL
  Filled 2024-12-12 (×5): qty 1

## 2024-12-12 MED ORDER — CAPSAICIN 0.025 % EX CREA
TOPICAL_CREAM | Freq: Two times a day (BID) | CUTANEOUS | Status: DC
  Filled 2024-12-12: qty 60

## 2024-12-12 NOTE — Progress Notes (Signed)
 Palliative:  HPI: 64 year old male with past medical history of ESRD on hemodialysis, HFrEF 20 to 25% EF, type 2 diabetes, atrial fibrillation admitted on 10/24/2024 with MRSA bacteremia and AH RF secondary to pneumonia. Patient transitioned to full comfort measures only on 11/30/2024 by PCCM.  I reviewed records and notes from hospitalist and palliative care team. Noted full comfort care and anticipation of hospital death. No desire for consideration of hospice facility per previous notes.   I discussed with Dr. Cindy. I met today with Lyn[place] and no family at bedside. He seems restless and will answer some of my questions but not others. Struggling to focus and converse. Continues to struggle with itching as he is scratching at his arms throughout my visit. He does confirm that itching is still a problem for him. I will adjust medication. He has much food at bedside but has not eaten. I left him to rest. Emotional support provided.   Exam: Awake, alert. Verbal but difficult to focus but able to answer some yes/no questions inconsistently. No distress. Itching bilateral arms. Breathing regular, unlabored. Abd soft. BLE edema.   Plan: - DNR, full comfort care  - Symptom Management:   Oxycodone  as needed for severe pain Acetaminophen  as needed for mild pain Dilaudid  as needed for severe pain/dyspnea/air hunger/discomfort Robinul  as needed for excessive oral secretions Ativan  as needed for agitation/anxiety Changed Atarax  from BID to scheduled TID to address itching symptoms.  Added capsaicin  cream BID to arms.  Benadryl  as needed for itching Artificial tears as needed for dry eyes Haldol as needed for agitation/anxiety May have comfort feeding Oxygen as needed for comfort, no escalation.  25 min  Bernarda Kitty, NP Palliative Medicine Team Pager [phone] (Please see [email or web address] for schedule) Team Phone [phone]

## 2024-12-12 NOTE — Progress Notes (Signed)
 " Progress Note   Patient: [name] RAHIMI Sr. FMW:[id number] DOB: 1961/09/21 DOA: 10/24/2024     49 DOS: the patient was seen and examined on 12/12/2024   Brief hospital course: 64 y.o. male who has a PMH as outlined below including but not limited to ESRD on HD MWF, A-fib, systolic CHF, hypertension, LBBB, DM.  He presented to [facility] ED on 10/24/2024 with dyspnea x roughly 2-3 weeks. He had been seen in the ED a few days early on 12/2 for dysuria and was started on a course of Cipro  which she started the following day and stated that he had been taking as prescribed.     CT chest that demonstrated multifocal pneumonia and small effusions.  Blood cultures ultimately grew MRSA.  He had been on vancomycin  which was continued. He had a left thora 12/10 with 450cc fluid removed.  His HD catheter was removed on 10/28/2024 as well as an arterial line.  He did have a TTE on 12/11 that showed an EF of 20-25%, G2 DD, mild decrease in RV SF, severe AAS, no vegetations.  TEE was recommended but had been deferred due to patient requiring vasopressor support.  ID was consulted and recommended line holiday.  He had a temporary femoral HD cath placed on 12/15.  ID recommendations included 6 weeks of vancomycin  to end on 12/09/2024.    12/17, he was transferred to [facility] for ongoing management for septic shock as well as for IR evaluation for tunneled HD catheter placement   Significant Hospital Events:  12/8 admit 12/10 L thora with 450cc removed, transudate, neg cultures 12/11 echo 12/12 HD cath and art line removed 12/15 new femoral HD cath placed 12/17 transfer to [facility] 28M 12/19 HD  12/20 CRRT 12/23 Weaned off levophed  this am. HD trial this am. Transferred to [facility] 12/25 Returned to ICU for hypotension 12/28-remains on Levophed  12/29-remains on Levophed , tolerating dialysis 12/31 was off Levophed  this morning 1/2 on Levophed  and had HD 1/4 changed to neo and vaso, stopped levophed  1/5 HD 1/6 CRRT and  GOC discussion w/ pt's daughter  1/14 CRRT discontinued. Plan to transition to comfort care  Assessment and Plan: GOC Palliative following. Discussion w/ pt and pt's daughter who is POA. PMT and CCM expressed concern to both how pt may not tolerate HD outpatient. See 1/6 IPAL note -Patient states will follow daughter's decision -He is aware that if he is unable to tolerate HD then he will die -After family discussion, pt was transitioned to comfort care 11/30/24. CRRT discontinued and vasopressor weaned off - Continue comfort care; currently has not been requiring any as needed medications -Anticipating hospital death. Pt seems more drowsy today   Chronic hypotension requiring vasopressor support No evidence of worsening infection driving hypotension.  Not in heart failure.  Suspect related to poor cardiac function in setting of renal disease - Has been trialed on midodrine  and Strattera  which have now been discontinued in setting of comfort care - s/p CRRT with vasopressor dependence; now being transitioned off dialysis and vasopressor support to comfort care   MRSA bacteremia - s/p vanc; was to complete extended course to 1/23, but stopped in setting of comfort care - Unable to complete TEE at this time - 12/12 Bcx negative, 12/18 Bcx negative - 1/5 Bcx NGTD   Elevated LFTs Elevated Bili Slowly improving with stable bilirubin. Denies abd pain and NT on abd exam. Afebrile. UQ US  w/ single mobile gallstone.  - continue comfort care  Afib w/ RVR - Currently rate controled - continue comfort care    Heart failure with reduced ejection fraction of 20 to 25% Severe aortic stenosis - Not on GDMT due to hypotension  - continue comfort care    End-stage renal disease on hemodialysis Hypervolemic with 3rd spacing - continue comfort care  - No further dialysis   Type 2 diabetes CBG goal 140-180. Poor appetite so insulin  d/c.  - continue comfort care    Peripheral vascular  disease - Aspirin  discontinued in setting of comfort care   Depressed mood Poor appetite - continue comfort care      Subjective: Without complaints this AM. Does not want to eat anything this AM  Physical Exam: Vitals:   12/10/24 0512 12/11/24 0500 12/11/24 2026 12/12/24 0500  BP: (!) 122/107 (!) 92/27 (!) 94/50 108/64  Pulse: 86 85 84 84  Resp:   18   Temp: 98.1 F (36.7 C) 98.6 F (37 C) 98.7 F (37.1 C) 98.9 F (37.2 C)  TempSrc: Oral     SpO2: 97% 93% 95% 95%  Weight:      Height:       General exam: Laying in bed, in no acute distress Respiratory system: normal chest rise, clear, no audible wheezing Cardiovascular system: regular rhythm, s1-s2 Gastrointestinal system: Nondistended, nontender, pos BS Central nervous system: No seizures, no tremors Extremities: No cyanosis, no joint deformities Skin: No rashes, no pallor Psychiatry: difficult to assess given mentation  Data Reviewed:  There are no new results to review at this time.  Family Communication: Pt in room, family not at bedside  Disposition: Status is: Inpatient Remains inpatient appropriate because: severity of illness  Planned Discharge Destination: Hospital death    Author: Garnette Pelt, MD 12/12/2024 2:29 PM  For on call review [email or web address]  "

## 2024-12-12 NOTE — Plan of Care (Signed)
 " Problem: Education: Goal: Knowledge of General Education information will improve Description: Including pain rating scale, medication(s)/side effects and non-pharmacologic comfort measures Outcome: Progressing   Problem: Clinical Measurements: Goal: Ability to maintain clinical measurements within normal limits will improve Outcome: Progressing Goal: Will remain free from infection Outcome: Progressing Goal: Diagnostic test results will improve Outcome: Progressing Goal: Respiratory complications will improve Outcome: Progressing Goal: Cardiovascular complication will be avoided Outcome: Progressing   Problem: Nutrition: Goal: Adequate nutrition will be maintained Outcome: Progressing   Problem: Elimination: Goal: Will not experience complications related to bowel motility Outcome: Progressing   Problem: Pain Managment: Goal: General experience of comfort will improve and/or be controlled Outcome: Progressing   Problem: Safety: Goal: Ability to remain free from injury will improve Outcome: Progressing   Problem: Skin Integrity: Goal: Risk for impaired skin integrity will decrease Outcome: Progressing   Problem: Activity: Goal: Ability to tolerate increased activity will improve Outcome: Progressing   Problem: Clinical Measurements: Goal: Ability to maintain a body temperature in the normal range will improve Outcome: Progressing   Problem: Respiratory: Goal: Ability to maintain adequate ventilation will improve Outcome: Progressing Goal: Ability to maintain a clear airway will improve Outcome: Progressing   Problem: Education: Goal: Ability to describe self-care measures that may prevent or decrease complications (Diabetes Survival Skills Education) will improve Outcome: Progressing Goal: Individualized Educational Video(s) Outcome: Progressing   Problem: Coping: Goal: Ability to adjust to condition or change in health will improve Outcome: Progressing    Problem: Fluid Volume: Goal: Ability to maintain a balanced intake and output will improve Outcome: Progressing   Problem: Health Behavior/Discharge Planning: Goal: Ability to identify and utilize available resources and services will improve Outcome: Progressing Goal: Ability to manage health-related needs will improve Outcome: Progressing   Problem: Metabolic: Goal: Ability to maintain appropriate glucose levels will improve Outcome: Progressing   Problem: Nutritional: Goal: Maintenance of adequate nutrition will improve Outcome: Progressing Goal: Progress toward achieving an optimal weight will improve Outcome: Progressing   Problem: Skin Integrity: Goal: Risk for impaired skin integrity will decrease Outcome: Progressing   Problem: Tissue Perfusion: Goal: Adequacy of tissue perfusion will improve Outcome: Progressing   Problem: Education: Goal: Knowledge of disease and its progression will improve Outcome: Progressing Goal: Individualized Educational Video(s) Outcome: Progressing   Problem: Fluid Volume: Goal: Compliance with measures to maintain balanced fluid volume will improve Outcome: Progressing   Problem: Health Behavior/Discharge Planning: Goal: Ability to manage health-related needs will improve Outcome: Progressing   Problem: Nutritional: Goal: Ability to make healthy dietary choices will improve Outcome: Progressing   Problem: Clinical Measurements: Goal: Complications related to the disease process, condition or treatment will be avoided or minimized Outcome: Progressing   Problem: Education: Goal: Knowledge of disease or condition will improve Outcome: Progressing Goal: Understanding of medication regimen will improve Outcome: Progressing Goal: Individualized Educational Video(s) Outcome: Progressing   Problem: Activity: Goal: Ability to tolerate increased activity will improve Outcome: Progressing   Problem: Cardiac: Goal: Ability to  achieve and maintain adequate cardiopulmonary perfusion will improve Outcome: Progressing   Problem: Health Behavior/Discharge Planning: Goal: Ability to safely manage health-related needs after discharge will improve Outcome: Progressing   Problem: Education: Goal: Ability to demonstrate management of disease process will improve Outcome: Progressing Goal: Ability to verbalize understanding of medication therapies will improve Outcome: Progressing Goal: Individualized Educational Video(s) Outcome: Progressing   Problem: Activity: Goal: Capacity to carry out activities will improve Outcome: Progressing   Problem: Cardiac: Goal: Ability to achieve and  maintain adequate cardiopulmonary perfusion will improve Outcome: Progressing   "

## 2024-12-13 DIAGNOSIS — J188 Other pneumonia, unspecified organism: Secondary | ICD-10-CM | POA: Diagnosis not present

## 2024-12-13 DIAGNOSIS — N186 End stage renal disease: Secondary | ICD-10-CM | POA: Diagnosis not present

## 2024-12-13 MED ORDER — LIDOCAINE 5 % EX OINT: TOPICAL_OINTMENT | Freq: Three times a day (TID) | CUTANEOUS | Status: DC | PRN

## 2024-12-13 MED ORDER — LIDOCAINE 5 % EX OINT: TOPICAL_OINTMENT | Freq: Two times a day (BID) | CUTANEOUS | Status: DC | PRN

## 2024-12-13 MED ORDER — BENZONATATE 100 MG PO CAPS
100.0000 mg | ORAL_CAPSULE | Freq: Three times a day (TID) | ORAL | Status: DC | PRN
  Administered 2024-12-13: 100 mg via ORAL
  Filled 2024-12-13: qty 1

## 2024-12-13 NOTE — Plan of Care (Signed)
 Patient remains comfortable throughout shift, barriers to care plan are due to patient being on comfort care. Main aspects of comfort care met throughout the shift, patient was kept comfortable, clean, and relaxed throughout entirety of shift with repositioning as a forefront.

## 2024-12-13 NOTE — Plan of Care (Signed)
 " Problem: Education: Goal: Knowledge of General Education information will improve Description: Including pain rating scale, medication(s)/side effects and non-pharmacologic comfort measures Outcome: Progressing   Problem: Clinical Measurements: Goal: Ability to maintain clinical measurements within normal limits will improve Outcome: Progressing Goal: Will remain free from infection Outcome: Progressing Goal: Diagnostic test results will improve Outcome: Progressing Goal: Respiratory complications will improve Outcome: Progressing Goal: Cardiovascular complication will be avoided Outcome: Progressing   Problem: Nutrition: Goal: Adequate nutrition will be maintained Outcome: Progressing   Problem: Elimination: Goal: Will not experience complications related to bowel motility Outcome: Progressing   Problem: Pain Managment: Goal: General experience of comfort will improve and/or be controlled Outcome: Progressing   Problem: Safety: Goal: Ability to remain free from injury will improve Outcome: Progressing   Problem: Skin Integrity: Goal: Risk for impaired skin integrity will decrease Outcome: Progressing   Problem: Activity: Goal: Ability to tolerate increased activity will improve Outcome: Progressing   Problem: Clinical Measurements: Goal: Ability to maintain a body temperature in the normal range will improve Outcome: Progressing   Problem: Respiratory: Goal: Ability to maintain adequate ventilation will improve Outcome: Progressing Goal: Ability to maintain a clear airway will improve Outcome: Progressing   Problem: Education: Goal: Ability to describe self-care measures that may prevent or decrease complications (Diabetes Survival Skills Education) will improve Outcome: Progressing Goal: Individualized Educational Video(s) Outcome: Progressing   Problem: Coping: Goal: Ability to adjust to condition or change in health will improve Outcome: Progressing    Problem: Fluid Volume: Goal: Ability to maintain a balanced intake and output will improve Outcome: Progressing   Problem: Health Behavior/Discharge Planning: Goal: Ability to identify and utilize available resources and services will improve Outcome: Progressing Goal: Ability to manage health-related needs will improve Outcome: Progressing   Problem: Metabolic: Goal: Ability to maintain appropriate glucose levels will improve Outcome: Progressing   Problem: Nutritional: Goal: Maintenance of adequate nutrition will improve Outcome: Progressing Goal: Progress toward achieving an optimal weight will improve Outcome: Progressing   Problem: Skin Integrity: Goal: Risk for impaired skin integrity will decrease Outcome: Progressing   Problem: Tissue Perfusion: Goal: Adequacy of tissue perfusion will improve Outcome: Progressing   Problem: Education: Goal: Knowledge of disease and its progression will improve Outcome: Progressing Goal: Individualized Educational Video(s) Outcome: Progressing   Problem: Fluid Volume: Goal: Compliance with measures to maintain balanced fluid volume will improve Outcome: Progressing   Problem: Health Behavior/Discharge Planning: Goal: Ability to manage health-related needs will improve Outcome: Progressing   Problem: Nutritional: Goal: Ability to make healthy dietary choices will improve Outcome: Progressing   Problem: Clinical Measurements: Goal: Complications related to the disease process, condition or treatment will be avoided or minimized Outcome: Progressing   Problem: Education: Goal: Knowledge of disease or condition will improve Outcome: Progressing Goal: Understanding of medication regimen will improve Outcome: Progressing Goal: Individualized Educational Video(s) Outcome: Progressing   Problem: Activity: Goal: Ability to tolerate increased activity will improve Outcome: Progressing   Problem: Cardiac: Goal: Ability to  achieve and maintain adequate cardiopulmonary perfusion will improve Outcome: Progressing   Problem: Health Behavior/Discharge Planning: Goal: Ability to safely manage health-related needs after discharge will improve Outcome: Progressing   Problem: Education: Goal: Ability to demonstrate management of disease process will improve Outcome: Progressing Goal: Ability to verbalize understanding of medication therapies will improve Outcome: Progressing Goal: Individualized Educational Video(s) Outcome: Progressing   Problem: Activity: Goal: Capacity to carry out activities will improve Outcome: Progressing   Problem: Cardiac: Goal: Ability to achieve and  maintain adequate cardiopulmonary perfusion will improve Outcome: Progressing   "

## 2024-12-13 NOTE — Progress Notes (Signed)
 Patient was ANOx1-2 for the majority of the day, he slept for the majority of the day, waking up for meals and medication. He was able to eat some of his meals with assistance from staff but still has poor PO intake, majority of liquid intake remains with that of his pills. He continues to have a strong dry cough, as well as excessive coughing during meals and mostly after drinking liquids. He complained of pain once this shift and was medicated appropriately, which was after his bed bath. He also was experiencing itching and was medicated appropriately while also having all his medical lotions applied per [facility], this seem to settle him down greatly. Patient greatly prefers left side leaning and does like not to be repositioned out of this angle, so repositioning was done around this side preference to keep patient as comfortable as possible.

## 2024-12-13 NOTE — Progress Notes (Signed)
 Palliative:  HPI: 64 y.o. 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 Required intubation 8/12.   I met today with 3 daughters, sister, brother, and [name] Lecher NP PCCM. We had discussion regarding path forward for one way extubation. We reviewed poor prognosis and expectation that [name] [name] will not do well once extubated. Family reiterate goal that he not suffer. We reviewed option to have medication available if he is having distress vs having medication already onboard to prevent distress. Family would like to have medication onboard to minimize any suffering during transition off ventilator. They would like to move forward with extubation later today and will have the rest of the family come to visit prior to extubation.   I reviewed plans and symptom management recommendations with RN.   Update: I was present with family prior to additional support as well as throughout extubation. Family appropriately tearful. Assisted to ensure comfort. I left family to visit privately with [name] [name] after he is resting comfortably after extubation.   All questions/concerns addressed. Emotional support provided. Much time coordinating care with RN, PCCM, and family.   Exam: Sedated on vent. FiO2 50%. Breathing regular, unlabored on vent. No distress. Not following commands. Abd soft. Warm to touch.   Plan:  - DNR - Extubated to full comfort care - Anticipate hospital death  100 min  Bernarda Kitty, NP Palliative Medicine Team Pager [phone] (Please see [email or web address] for schedule) Team Phone [phone]

## 2024-12-13 NOTE — Patient Care Conference (Signed)
 Called and updated pt's daughter at number listed. Questions answered.

## 2024-12-13 NOTE — Progress Notes (Signed)
 " Progress Note   Patient: [name] STAMAS Sr. FMW:[id number] DOB: 02-Mar-1961 DOA: 10/24/2024     50 DOS: the patient was seen and examined on 12/13/2024   Brief hospital course: 64 y.o. male who has a PMH as outlined below including but not limited to ESRD on HD MWF, A-fib, systolic CHF, hypertension, LBBB, DM.  He presented to [facility] ED on 10/24/2024 with dyspnea x roughly 2-3 weeks. He had been seen in the ED a few days early on 12/2 for dysuria and was started on a course of Cipro  which she started the following day and stated that he had been taking as prescribed.     CT chest that demonstrated multifocal pneumonia and small effusions.  Blood cultures ultimately grew MRSA.  He had been on vancomycin  which was continued. He had a left thora 12/10 with 450cc fluid removed.  His HD catheter was removed on 10/28/2024 as well as an arterial line.  He did have a TTE on 12/11 that showed an EF of 20-25%, G2 DD, mild decrease in RV SF, severe AAS, no vegetations.  TEE was recommended but had been deferred due to patient requiring vasopressor support.  ID was consulted and recommended line holiday.  He had a temporary femoral HD cath placed on 12/15.  ID recommendations included 6 weeks of vancomycin  to end on 12/09/2024.    12/17, he was transferred to [facility] for ongoing management for septic shock as well as for IR evaluation for tunneled HD catheter placement   Significant Hospital Events:  12/8 admit 12/10 L thora with 450cc removed, transudate, neg cultures 12/11 echo 12/12 HD cath and art line removed 12/15 new femoral HD cath placed 12/17 transfer to [facility] 24M 12/19 HD  12/20 CRRT 12/23 Weaned off levophed  this am. HD trial this am. Transferred to [facility] 12/25 Returned to ICU for hypotension 12/28-remains on Levophed  12/29-remains on Levophed , tolerating dialysis 12/31 was off Levophed  this morning 1/2 on Levophed  and had HD 1/4 changed to neo and vaso, stopped levophed  1/5 HD 1/6 CRRT and  GOC discussion w/ pt's daughter  1/14 CRRT discontinued. Plan to transition to comfort care  Assessment and Plan: GOC Palliative following. Discussion w/ pt and pt's daughter who is POA. PMT and CCM expressed concern to both how pt may not tolerate HD outpatient. See 1/6 IPAL note -Patient states will follow daughter's decision -He is aware that if he is unable to tolerate HD then he will die -After family discussion, pt was transitioned to comfort care 11/30/24. CRRT discontinued and vasopressor weaned off - Continue comfort care; currently has not been requiring any as needed medications -Anticipating hospital death. Palliative Care continues to follow   Chronic hypotension requiring vasopressor support No evidence of worsening infection driving hypotension.  Not in heart failure.  Suspect related to poor cardiac function in setting of renal disease - Has been trialed on midodrine  and Strattera  which have now been discontinued in setting of comfort care - s/p CRRT with vasopressor dependence; now being transitioned off dialysis and vasopressor support to comfort care   MRSA bacteremia - s/p vanc; was to complete extended course to 1/23, but stopped in setting of comfort care - Unable to complete TEE at this time - 12/12 Bcx negative, 12/18 Bcx negative - 1/5 Bcx NGTD   Elevated LFTs Elevated Bili Slowly improving with stable bilirubin. Denies abd pain and NT on abd exam. Afebrile. UQ US  w/ single mobile gallstone.  - continue comfort care  Afib w/ RVR - Currently rate controled - continue comfort care    Heart failure with reduced ejection fraction of 20 to 25% Severe aortic stenosis - Not on GDMT due to hypotension  - continue comfort care    End-stage renal disease on hemodialysis Hypervolemic with 3rd spacing - continue comfort care  - No further dialysis   Type 2 diabetes CBG goal 140-180. Poor appetite so insulin  d/c.  - continue comfort care    Peripheral  vascular disease - Aspirin  discontinued in setting of comfort care   Depressed mood Poor appetite - continue comfort care      Subjective: No complaints this AM  Physical Exam: Vitals:   12/12/24 0500 12/12/24 2100 12/12/24 2247 12/13/24 0421  BP: 108/64 (!) 88/55 (!) 91/36 (!) 84/62  Pulse: 84  81 85  Resp:   16 14  Temp: 98.9 F (37.2 C) 97.9 F (36.6 C) (!) 97.5 F (36.4 C) 97.6 F (36.4 C)  TempSrc:  Oral Oral   SpO2: 95%  92% 98%  Weight:      Height:       General exam: Conversant, in no acute distress Respiratory system: normal chest rise, clear, no audible wheezing Cardiovascular system: regular rhythm, s1-s2 Gastrointestinal system: Nondistended, nontender, pos BS Central nervous system: No seizures, no tremors Extremities: No cyanosis, no joint deformities Skin: No rashes, no pallor Psychiatry: Affect normal // no auditory hallucinations   Data Reviewed:  There are no new results to review at this time.  Family Communication: Pt in room, family not at bedside  Disposition: Status is: Inpatient Remains inpatient appropriate because: severity of illness  Planned Discharge Destination: Hospital death    Author: Garnette Pelt, MD 12/13/2061 2:39 PM  For on call review [email or web address]  "

## 2024-12-14 MED ORDER — DIPHENHYDRAMINE HCL 50 MG/ML IJ SOLN: 12.5000 mg | Freq: Four times a day (QID) | INTRAMUSCULAR | Status: DC | PRN

## 2024-12-14 MED ORDER — GLYCOPYRROLATE 0.2 MG/ML IJ SOLN
0.4000 mg | INTRAMUSCULAR | Status: DC | PRN
  Administered 2024-12-14: 0.4 mg via INTRAVENOUS

## 2024-12-14 MED ORDER — HYDROMORPHONE HCL 1 MG/ML IJ SOLN
1.0000 mg | INTRAMUSCULAR | Status: DC | PRN
  Administered 2024-12-14 (×2): 1 mg via INTRAVENOUS
  Filled 2024-12-14 (×2): qty 1

## 2024-12-14 MED ORDER — DIPHENHYDRAMINE HCL 50 MG/ML IJ SOLN
12.5000 mg | Freq: Two times a day (BID) | INTRAMUSCULAR | Status: DC
  Administered 2024-12-14: 12.5 mg via INTRAVENOUS
  Filled 2024-12-14: qty 1

## 2024-12-14 MED ORDER — GLYCOPYRROLATE 0.2 MG/ML IJ SOLN
0.4000 mg | INTRAMUSCULAR | Status: DC | PRN
  Filled 2024-12-14: qty 2

## 2024-12-18 NOTE — Progress Notes (Signed)
 Palliative:  HPI: 64 y.o. 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 Required intubation 8/12.   I met today with 3 daughters, sister, brother, and Inge Lecher NP PCCM. We had discussion regarding path forward for one way extubation. We reviewed poor prognosis and expectation that JD will not do well once extubated. Family reiterate goal that he not suffer. We reviewed option to have medication available if he is having distress vs having medication already onboard to prevent distress. Family would like to have medication onboard to minimize any suffering during transition off ventilator. They would like to move forward with extubation later today and will have the rest of the family come to visit prior to extubation.   I reviewed plans and symptom management recommendations with RN.   Update: I was present with family prior to additional support as well as throughout extubation. Family appropriately tearful. Assisted to ensure comfort. I left family to visit privately with JD after he is resting comfortably after extubation.   All questions/concerns addressed. Emotional support provided. Much time coordinating care with RN, PCCM, and family.   Exam: Sedated on vent. FiO2 50%. Breathing regular, unlabored on vent. No distress. Not following commands. Abd soft. Warm to touch.   Plan:  - DNR - Extubated to full comfort care - Anticipate hospital death  100 min  Bernarda Kitty, NP Palliative Medicine Team Pager [phone] (Please see [email or web address] for schedule) Team Phone [phone]

## 2024-12-18 NOTE — Progress Notes (Signed)
 Noted that pt has passed. Contacted his out-pt HD clinic and informed of this. No further support needed.   Lavanda [place] Dialysis Navigator [phone]

## 2024-12-18 NOTE — Progress Notes (Signed)
" °   12/18/2024 1904  Spiritual Encounters  Type of Visit Initial  Care provided to: [facility] partners present during encounter Nurse  Reason for visit Patient death  OnCall Visit Yes   Responded to call from nurse to meet with family of patient and offer support. Patient's 2 younger brothers, daughter, aunt and other relative where present. Offered support to family, direction pertaining to final arrangements and prayer.  "

## 2024-12-18 NOTE — Progress Notes (Signed)
 "          [facility]                                                                              [name] [name], is a 64 y.o. male, DOB - November 15, 1961, FMW:[id number] Admit date - 10/24/2024    Outpatient Primary MD for the patient is [name], MD  LOS - 51  days  Chief Complaint  Patient presents with   Shortness of Breath       Brief summary   64 y.o. male who has a PMH as outlined below including but not limited to ESRD on HD MWF, A-fib, systolic CHF, hypertension, LBBB, DM.  He presented to [facility] ED on 10/24/2024 with dyspnea x roughly 2-3 weeks. He had been seen in the ED a few days early on 12/2 for dysuria and was started on a course of Cipro  which she started the following day and stated that he had been taking as prescribed.     CT chest that demonstrated multifocal pneumonia and small effusions.  Blood cultures ultimately grew MRSA.  He had been on vancomycin  which was continued. He had a left thora 12/10 with 450cc fluid removed.  His HD catheter was removed on 10/28/2024 as well as an arterial line.  He did have a TTE on 12/11 that showed an EF of 20-25%, G2 DD, mild decrease in RV SF, severe AAS, no vegetations.  TEE was recommended but had been deferred due to patient requiring vasopressor support.  ID was consulted and recommended line holiday.  He had a temporary femoral HD cath placed on 12/15.  ID recommendations included 6 weeks of vancomycin  to end on 12/09/2024.    12/17, he was transferred to [facility] for ongoing management for septic shock as well as for IR evaluation for tunneled HD catheter placement   Significant Hospital Events:  12/8 admit 12/10 L thora with 450cc removed, transudate, neg cultures 12/11 echo 12/12 HD cath and art line removed 12/15 new femoral HD cath placed 12/17 transfer to [facility]M 12/19 HD  12/20 CRRT 12/23 Weaned off levophed  this am. HD trial this am. Transferred to [facility] 12/25 Returned to ICU for  hypotension 12/28-remains on Levophed  12/29-remains on Levophed , tolerating dialysis 12/31 was off Levophed  this morning 1/2 on Levophed  and had HD 1/4 changed to neo and vaso, stopped levophed  1/5 HD 1/6 CRRT and GOC discussion w/ pt's daughter  1/14 CRRT discontinued. Plan to transition to comfort care     Assessment & Plan    Principal Problem:   Multifocal pneumonia Active Problems:   End of life care   Hypotension   Goals of care, counseling/discussion   CAD (coronary artery disease)   DM2 (diabetes mellitus, type 2) (HCC)   History of CVA    Below-knee amputation of right lower extremity (HCC)   MRSA bacteremia   Type 2 diabetes mellitus with diabetic chronic kidney disease (HCC)   Chronic systolic CHF (congestive heart failure) (HCC)   Peripheral vascular disease, unspecified   Sepsis due to methicillin resistant Staphylococcus aureus (MRSA) (HCC)   Pleural effusion   End stage renal disease (HCC)   Hemodialysis  catheter infection    ESRD on hemodialysis, MRSA bacteremia, multifocal pneumonia -Palliative consulted, per goals of care on 1/14, patient and daughter requested transition to comfort care - CRRT discontinued, vasopressors weaned off - Continue comfort care, anticipating hospital death.  Palliative medicine following.    Chronic hypotension requiring vasopressor support -No evidence of worsening infection driving hypotension.Suspect related to poor cardiac function in setting of renal disease - Has been trialed on midodrine , s/p CRRT with vasopressor dependence - Now transitioned dialysis, pressors to comfort care  MRSA bacteremia - s/p vanc; was to complete extended course to 1/23, but stopped in setting of comfort care - Unable to complete TEE at this time - 12/12 Bcx negative, 12/18 Bcx negative, 1/5 blood cultures NTD -Continue comfort care   Elevated LFTs Elevated Bili Slowly improving with stable bilirubin.  RUQ US  w/ single mobile  gallstone.  - continue comfort care    Afib w/ RVR - Currently rate controled - continue comfort care    Heart failure with reduced ejection fraction of 20 to 25% Severe aortic stenosis - Not on GDMT due to hypotension  - continue comfort care    End-stage renal disease on hemodialysis Hypervolemic with 3rd spacing - continue comfort care  - No further dialysis   Type 2 diabetes CBG goal 140-180. Poor appetite so insulin  d/c.  - continue comfort care    Peripheral vascular disease - Aspirin  discontinued in setting of comfort care   Depressed mood Poor appetite - continue comfort care   Pressure Injury Documentation: Not POA Wound 11/07/24 1340 Pressure Injury Coccyx Bilateral Stage 2 -  Partial thickness loss of dermis presenting as a shallow open injury with a red, pink wound bed without slough. (Active)    Mild protein calorie malnutrition Nutrition Problem: Increased nutrient needs Etiology: chronic illness Signs/Symptoms: estimated needs   Interventions: Ensure Enlive (each supplement provides 350kcal and 20 grams of protein), MVI, Refer to RD note for recommendations  Obesity class I Estimated body mass index is 30.72 kg/m as calculated from the following:   Height as of this encounter: 6' 1 (1.854 m).   Weight as of this encounter: 105.6 kg.  Code Status: DNR, comfort care DVT Prophylaxis:     Level of Care: Level of care: Med-Surg Family Communication:  Disposition Plan:      Remains inpatient appropriate: Anticipating  hospital death   Procedures:   Consultants:     Antimicrobials:   Anti-infectives (From admission, onward)    Start     Dose/Rate Route Frequency Ordered Stop   11/29/24 1700  vancomycin  (VANCOCIN ) IVPB 1000 mg/200 mL premix  Status:  Discontinued        1,000 mg 200 mL/hr over 60 Minutes Intravenous Every 24 hours 11/29/24 1003 11/30/24 1104   11/27/24 1200  vancomycin  (VANCOREADY) IVPB 1500 mg/300 mL  Status:  Discontinued         1,500 mg 150 mL/hr over 120 Minutes Intravenous Every 24 hours 11/26/24 1437 11/26/24 1441   11/27/24 1200  vancomycin  (VANCOREADY) IVPB 1250 mg/250 mL  Status:  Discontinued        1,250 mg 166.7 mL/hr over 90 Minutes Intravenous Every 24 hours 11/26/24 1441 11/29/24 0831   11/27/24 1000  vancomycin  (VANCOREADY) IVPB 1250 mg/250 mL  Status:  Discontinued        1,250 mg 166.7 mL/hr over 90 Minutes Intravenous Every 24 hours 11/26/24 0855 11/26/24 1437   11/23/24 1145  vancomycin  (VANCOREADY) IVPB 1500 mg/300 mL  Status:  Discontinued        1,500 mg 150 mL/hr over 120 Minutes Intravenous Every 24 hours 11/23/24 1049 11/26/24 0855   11/22/24 2000  piperacillin -tazobactam (ZOSYN ) IVPB 3.375 g        3.375 g 100 mL/hr over 30 Minutes Intravenous Every 6 hours 11/22/24 1440 11/24/24 1734   11/22/24 1439  vancomycin  variable dose per unstable renal function (pharmacist dosing)  Status:  Discontinued         Does not apply See admin instructions 11/22/24 1440 11/23/24 1257   11/20/24 1315  piperacillin -tazobactam (ZOSYN ) IVPB 2.25 g  Status:  Discontinued        2.25 g 100 mL/hr over 30 Minutes Intravenous Every 6 hours 11/20/24 1215 11/22/24 1440   11/19/24 1500  vancomycin  (VANCOREADY) IVPB 750 mg/150 mL        750 mg 150 mL/hr over 60 Minutes Intravenous  Once 11/19/24 1057 11/19/24 1725   11/16/24 1200  vancomycin  (VANCOREADY) IVPB 750 mg/150 mL  Status:  Discontinued        750 mg 150 mL/hr over 60 Minutes Intravenous Every M-W-F (Hemodialysis) 11/14/24 0853 11/22/24 1440   11/11/24 1300  vancomycin  (VANCOCIN ) IVPB 1000 mg/200 mL premix        1,000 mg 200 mL/hr over 60 Minutes Intravenous  Once 11/11/24 1038 11/11/24 1357   11/08/24 0732  vancomycin  variable dose per unstable renal function (pharmacist dosing)  Status:  Discontinued         Does not apply See admin instructions 11/08/24 0732 11/14/24 0853   11/05/24 2000  vancomycin  (VANCOREADY) IVPB 1250 mg/250 mL  Status:   Discontinued        1,250 mg 166.7 mL/hr over 90 Minutes Intravenous Every 24 hours 11/05/24 1121 11/08/24 0732   11/04/24 1000  cefTRIAXone  (ROCEPHIN ) 2 g in sodium chloride  0.9 % 100 mL IVPB        2 g 200 mL/hr over 30 Minutes Intravenous Every 24 hours 11/04/24 0900 11/08/24 1405   11/03/24 0915  vancomycin  (VANCOREADY) IVPB 750 mg/150 mL        750 mg 150 mL/hr over 60 Minutes Intravenous  Once 11/03/24 0833 11/03/24 1450   10/31/24 0000  vancomycin  IVPB        1,000 mg Intravenous Every M-W-F (Hemodialysis) 10/31/24 1154 12/09/24 2359   10/28/24 1400  vancomycin  (VANCOCIN ) IVPB 1000 mg/200 mL premix  Status:  Discontinued        1,000 mg 200 mL/hr over 60 Minutes Intravenous Every M-W-F (Hemodialysis) 10/28/24 0814 11/05/24 1121   10/27/24 1719  vancomycin  variable dose per unstable renal function (pharmacist dosing)  Status:  Discontinued         Does not apply See admin instructions 10/27/24 1719 11/01/24 1103   10/25/24 1800  ceFEPIme  (MAXIPIME ) 1 g in sodium chloride  0.9 % 100 mL IVPB  Status:  Discontinued        1 g 200 mL/hr over 30 Minutes Intravenous Every 24 hours 10/24/24 1642 10/25/24 0906   10/25/24 1600  ceFEPIme  (MAXIPIME ) 2 g in sodium chloride  0.9 % 100 mL IVPB  Status:  Discontinued        2 g 200 mL/hr over 30 Minutes Intravenous Every T-Th-Sa (Hemodialysis) 10/25/24 0906 10/25/24 1323   10/25/24 1600  vancomycin  (VANCOCIN ) IVPB 1000 mg/200 mL premix  Status:  Discontinued        1,000 mg 200 mL/hr over 60 Minutes Intravenous Every T-Th-Sa (Hemodialysis) 10/25/24 0908 10/27/24 1719  10/24/24 1800  ceFEPIme  (MAXIPIME ) 2 g in sodium chloride  0.9 % 100 mL IVPB        2 g 200 mL/hr over 30 Minutes Intravenous  Once 10/24/24 1638 10/24/24 1720   10/24/24 1730  vancomycin  (VANCOREADY) IVPB 2000 mg/400 mL        2,000 mg 200 mL/hr over 120 Minutes Intravenous  Once 10/24/24 1638 10/24/24 1926   10/24/24 1639  vancomycin  variable dose per unstable renal function  (pharmacist dosing)  Status:  Discontinued         Does not apply See admin instructions 10/24/24 1639 10/26/24 0843   10/24/24 1430  cefTRIAXone  (ROCEPHIN ) 1 g in sodium chloride  0.9 % 100 mL IVPB        1 g 200 mL/hr over 30 Minutes Intravenous  Once 10/24/24 1419 10/24/24 1536   10/24/24 1430  azithromycin  (ZITHROMAX ) 500 mg in sodium chloride  0.9 % 250 mL IVPB        500 mg 250 mL/hr over 60 Minutes Intravenous  Once 10/24/24 1419 10/24/24 1724          Medications  capsaicin    Topical BID   Chlorhexidine  Gluconate Cloth  6 each Topical Daily   diphenhydrAMINE   12.5 mg Intravenous Q12H   liver oil-zinc  oxide   Topical BID      Subjective:   [name] [name] was seen and examined today.  Not very responsive, appears comfortable.  No acute fever chills, pain, nausea or vomiting.    Objective:   Vitals:   12/12/24 2247 12/13/24 0421 12/13/24 2201 12/13/24 2237  BP: (!) 91/36 (!) 84/62 (!) 83/23 (!) 88/76  Pulse: 81 85 81 83  Resp: 16 14 14    Temp: (!) 97.5 F (36.4 C) 97.6 F (36.4 C) 97.8 F (36.6 C)   TempSrc: Oral  Oral   SpO2: 92% 98% 97% 95%  Weight:      Height:        Intake/Output Summary (Last 24 hours) at 15-Dec-2024 1133 Last data filed at December 15, 2024 1025 Gross per 24 hour  Intake 120 ml  Output --  Net 120 ml     Wt Readings from Last 3 Encounters:  12/01/24 105.6 kg  10/17/24 108.9 kg  10/16/24 108.9 kg     Exam General: Not very responsive, NAD Cardiovascular: S1 S2 auscultated,  RRR Respiratory: Clear to auscultation bilaterally, no wheezing Gastrointestinal: Soft, nontender, nondistended, + bowel sounds Ext: no pedal edema bilaterally Neuro: Minimally responsive    Data Reviewed:  I have personally reviewed following labs    CBC Lab Results  Component Value Date   WBC 6.0 11/28/2024   RBC 3.36 (L) 11/28/2024   HGB 11.9 (L) 11/28/2024   HCT 35.0 (L) 11/28/2024   MCV 83.9 11/28/2024   MCH 27.4 11/28/2024   PLT 110 (L)  11/28/2024   MCHC 32.6 11/28/2024   RDW 27.4 (H) 11/28/2024   LYMPHSABS 1.0 11/10/2024   MONOABS 1.2 (H) 11/10/2024   EOSABS 0.5 11/10/2024   BASOSABS 0.1 11/10/2024     Last metabolic panel Lab Results  Component Value Date   NA 133 (L) 11/29/2024   K 4.3 11/29/2024   CL 101 11/29/2024   CO2 21 (L) 11/29/2024   BUN 9 11/29/2024   CREATININE 1.78 (H) 11/29/2024   GLUCOSE 179 (H) 11/29/2024   GFRNONAA 43 (L) 11/29/2024   GFRAA 3 (L) 08/14/2019   CALCIUM  8.4 (L) 11/29/2024   PHOS 1.8 (L) 11/29/2024   PROT 7.8 11/28/2024  ALBUMIN  3.2 (L) 11/29/2024   LABGLOB 5.1 (H) 12/17/2022   AGRATIO 0.7 11/20/2022   BILITOT 3.5 (H) 11/28/2024   ALKPHOS 138 (H) 11/28/2024   AST 84 (H) 11/28/2024   ALT 69 (H) 11/28/2024   ANIONGAP 12 11/29/2024    CBG (last 3)  No results for input(s): GLUCAP in the last 72 hours.    Coagulation Profile: No results for input(s): INR, PROTIME in the last 168 hours.   Radiology Studies: I have personally reviewed the imaging studies  No results found.     Nydia Distance M.D. [facility] 27-Dec-2024, 11:33 AM  Available via Epic secure chat 7am-7pm After 7 pm, please refer to night coverage provider listed on amion.    "

## 2024-12-18 NOTE — Death Summary Note (Signed)
 "  DEATH SUMMARY   Patient Details  Name: [name] [name]. MRN: [id number] DOB: [birth year] ERE:Ylwuzm, Karna, MD Admission/Discharge Information   Admit Date:  November 08, 2024  Date of Death: Date of Death: 12/29/24  Time of Death: Time of Death: Jan 04, 1551  Length of Stay: 20   Principle Cause of death: Septic shock with MRSA bacteremia  Hospital Diagnoses:   Septic shock  MRSA bacteremia   Multifocal pneumonia Chronic hypotension   CAD (coronary artery disease)   DM2 (diabetes mellitus, type 2) (HCC)   History of CVA    Below-knee amputation of right lower extremity (HCC)   Type 2 diabetes mellitus with diabetic chronic kidney disease (HCC)   Chronic systolic CHF (congestive heart failure) (HCC)   Peripheral vascular disease, unspecified   Sepsis due to methicillin resistant Staphylococcus aureus (MRSA) (HCC)   Pleural effusion   End stage renal disease (HCC)   Hemodialysis catheter infection   Hospital Course:  Patient was a 64 y.o. male with history of ESRD on HD MWF, A-fib, systolic CHF, hypertension, LBBB, DM.  He presented to [facility] ED on 11/08/24 with dyspnea for roughly 2-3 weeks. He had been seen in the ED a few days early on 12/2 for dysuria and was started on a course of Cipro  which he had been taking as prescribed.    CT chest demonstrated multifocal pneumonia and small effusions.  Blood cultures ultimately grew MRSA.  He had been on vancomycin  which was continued. He had a left thoracentesis on 12/10 with 450cc fluid removed.  His HD catheter was removed on 10/28/2024 as well as an arterial line.  He did have a TTE on 12/11 that showed an EF of 20-25%, G2 DD, mild decrease in RV SF, severe AAS, no vegetations.  TEE was recommended but had been deferred due to patient requiring vasopressor support.  ID was consulted and recommended line holiday.  He had a temporary femoral HD cath placed on 12/15.  ID recommendations included 6 weeks of vancomycin  to end on 12/09/2024.     12/17, he was transferred to [facility] for ongoing management for septic shock as well as for IR evaluation for tunneled HD catheter placement.  Patient continued to require ICU and vasopressor support with CRRT. On 1/14, CRRT was discontinued per patient and family's wishes and transition to to comfort care.   Assessment and Plan:  Septic shock, POA  MRSA bacteremia, multifocal pneumonia -Palliative consulted, per goals of care on 1/14, patient and daughter requested transition to comfort care - CRRT was discontinued, vasopressors weaned off - Palliative medicine was consulted for GOC.  Patient was placed on comfort care per patient and family's wishes.   Chronic hypotension requiring vasopressor support -No evidence of worsening infection driving hypotension.Suspect related to poor cardiac function in setting of renal disease - Had been trialed on midodrine , s/p CRRT with vasopressor dependence - Patient was placed on comfort care    MRSA bacteremia - s/p vanc; was to complete extended course to 1/23, but stopped in setting of comfort care - Unable to complete TEE at this time - 12/12 Bcx negative, 12/18 Bcx negative, 1/5 blood cultures NTD - comfort care   Elevated LFTs Elevated Bili Slowly improving. RUQ US  w/ single mobile gallstone.  -comfort care    Afib w/ RVR - Currently rate controled -  comfort care    Heart failure with reduced ejection fraction of 20 to 25% Severe aortic stenosis - Not on GDMT due to  hypotension  -  comfort care    End-stage renal disease on hemodialysis Hypervolemic with 3rd spacing - continue comfort care  - No further dialysis   Type 2 diabetes CBG goal 140-180. Poor appetite so insulin  d/c.  - continue comfort care    Peripheral vascular disease - Aspirin  discontinued in setting of comfort care   Depressed mood Poor appetite - continue comfort care   Pressure Injury Documentation: Not POA Wound 11/07/24 1340 Pressure Injury  Coccyx Bilateral Stage 2 -  Partial thickness loss of dermis presenting as a shallow open injury with a red, pink wound bed without slough. (Active)      Mild protein calorie malnutrition Nutrition Problem: Increased nutrient needs Etiology: chronic illness Signs/Symptoms: estimated needs  Interventions: Ensure Enlive (each supplement provides 350kcal and 20 grams of protein), MVI, Refer to RD note for recommendations   Obesity class I Estimated body mass index is 30.72 kg/m as calculated from the following:   Height as of this encounter: 6' 1 (1.854 m).   Weight as of this encounter: 105.6 kg.  Patient passed on 17-Dec-2024 at 1552 (3:52 PM)         The results of significant diagnostics from this hospitalization (including imaging, microbiology, ancillary and laboratory) are listed below for reference.   Significant Diagnostic Studies: ECHOCARDIOGRAM COMPLETE Result Date: 11/22/2024    ECHOCARDIOGRAM REPORT   Patient Name:   [name] [name]. Date of Exam: 11/22/2024 Medical Rec #:  [id number]         Height:       73.0 in Accession #:    [id number]        Weight:       259.7 lb Date of Birth:  1961-02-06        BSA:          2.404 m Patient Age:    63 years          BP:           0/0 mmHg Patient Gender: M                 HR:           107 bpm. Exam Location:  Inpatient Procedure: 2D Echo, Cardiac Doppler and Color Doppler (Both Spectral and Color            Flow Doppler were utilized during procedure). Indications:    Shock R57.9  History:        Patient has prior history of Echocardiogram examinations, most                 recent 10/27/2024. CHF and Cardiomyopathy, CAD and Previous                 Myocardial Infarction, PAD, Signs/Symptoms:Hypotension; Risk                 Factors:Diabetes.  Sonographer:    Merlynn Argyle Referring Phys: [id number] DIPTI PLEAS  Sonographer Comments: Suboptimal subcostal window and suboptimal apical window. Image acquisition challenging due to patient body  habitus and Image acquisition challenging due to uncooperative patient. IMPRESSIONS  1. Left ventricular ejection fraction, by estimation, is 20 to 25%. The left ventricle has severely decreased function. The left ventricle demonstrates global hypokinesis. Left ventricular diastolic parameters are indeterminate. There is the interventricular septum is flattened in systole and diastole, consistent with right ventricular pressure and volume overload.  2. Right ventricular systolic function is severely reduced. The right ventricular size  is moderately enlarged.  3. Large pericardial effusion.  4. The mitral valve is degenerative. Mild mitral valve regurgitation.  5. Tricuspid valve regurgitation is severe.  6. DVI 0.28, Very decreased stroke volume, yet mean gradient as high as 38 mm Hg. The aortic valve is calcified. Aortic valve regurgitation is mild. Severe aortic valve stenosis. Aortic valve area, by VTI measures 0.88 cm. Aortic valve mean gradient measures 38.0 mmHg. Aortic valve Vmax measures 3.62 m/s. Comparison(s): Prior images reviewed side by side. Aortic valve gradient are worse despite worsening bi-ventricular function. FINDINGS  Left Ventricle: Left ventricular ejection fraction, by estimation, is 20 to 25%. The left ventricle has severely decreased function. The left ventricle demonstrates global hypokinesis. The left ventricular internal cavity size was normal in size. There is no left ventricular hypertrophy. The interventricular septum is flattened in systole and diastole, consistent with right ventricular pressure and volume overload. Left ventricular diastolic parameters are indeterminate. Right Ventricle: The right ventricular size is moderately enlarged. No increase in right ventricular wall thickness. Right ventricular systolic function is severely reduced. Left Atrium: Left atrial size was normal in size. Right Atrium: Right atrial size was normal in size. Pericardium: A large pericardial  effusion is present. Mitral Valve: The mitral valve is degenerative in appearance. Mild mitral valve regurgitation. Tricuspid Valve: The tricuspid valve is normal in structure. Tricuspid valve regurgitation is severe. No evidence of tricuspid stenosis. Aortic Valve: DVI 0.28, Very decreased stroke volume, yet mean gradient as high as 38 mm Hg. The aortic valve is calcified. Aortic valve regurgitation is mild. Severe aortic stenosis is present. Aortic valve mean gradient measures 38.0 mmHg. Aortic valve  peak gradient measures 52.4 mmHg. Aortic valve area, by VTI measures 0.88 cm. Pulmonic Valve: The pulmonic valve was not well visualized. Pulmonic valve regurgitation is not visualized. No evidence of pulmonic stenosis. Aorta: The aortic root and ascending aorta are structurally normal, with no evidence of dilitation. IAS/Shunts: No atrial level shunt detected by color flow Doppler. Additional Comments: There is pleural effusion in the left lateral region.  LEFT VENTRICLE PLAX 2D LVIDd:         5.60 cm LVIDs:         5.00 cm LV PW:         1.20 cm LV IVS:        1.10 cm LVOT diam:     2.00 cm LV SV:         41 LV SV Index:   17 LVOT Area:     3.14 cm  LV Volumes (MOD) LV vol d, MOD A2C: 136.0 ml LV vol s, MOD A2C: 102.0 ml LV SV MOD A2C:     34.0 ml RIGHT VENTRICLE RV S prime:     3.49 cm/s LEFT ATRIUM             Index        RIGHT ATRIUM           Index LA diam:        4.90 cm 2.04 cm/m   RA Area:     21.90 cm LA Vol (A2C):   83.9 ml 34.90 ml/m  RA Volume:   76.90 ml  31.98 ml/m LA Vol (A4C):   62.3 ml 25.91 ml/m LA Biplane Vol: 73.1 ml 30.40 ml/m  AORTIC VALVE AV Area (Vmax):    0.67 cm AV Area (Vmean):   0.67 cm AV Area (VTI):     0.88 cm AV Vmax:  362.00 cm/s AV Vmean:          234.667 cm/s AV VTI:            0.467 m AV Peak Grad:      52.4 mmHg AV Mean Grad:      38.0 mmHg LVOT Vmax:         77.30 cm/s LVOT Vmean:        50.300 cm/s LVOT VTI:          0.130 m LVOT/AV VTI ratio: 0.28  AORTA  Ao Root diam: 3.60 cm Ao Asc diam:  3.70 cm MR Peak grad: 87.2 mmHg   TRICUSPID VALVE MR Mean grad: 52.7 mmHg   TR Peak grad:   17.6 mmHg MR Vmax:      467.00 cm/s TR Vmax:        210.00 cm/s MR Vmean:     337.3 cm/s                           SHUNTS                           Systemic VTI:  0.13 m                           Systemic Diam: 2.00 cm Stanly Leavens MD Electronically signed by Stanly Leavens MD Signature Date/Time: 11/22/2024/2:47:16 PM    Final    US  Abdomen Limited RUQ (LIVER/GB) Result Date: 11/22/2024 EXAM: Right Upper Quadrant Abdominal Ultrasound 11/22/2024 10:47:05 AM TECHNIQUE: Real-time ultrasonography of the right upper quadrant of the abdomen was performed. COMPARISON: 09/19/2024, 10/29/2022. CLINICAL HISTORY: Elevated LFTs. FINDINGS: LIVER: Liver length measures 16.3 cm. Normal echogenicity. No intrahepatic biliary ductal dilatation. No evidence of mass. Hepatopetal flow in the portal vein. BILIARY SYSTEM: Mild gallbladder wall thickening measuring 3.5 mm. Single mobile gallstone noted. The sonographic Beverley sign was unable to be evaluated due to patient's clinical status. No pericholecystic fluid. Thickening of the common bile duct walls without biliary ductal dilation, with the CBD measuring 4.2 mm. OTHER: No right upper quadrant ascites. Right pleural effusion. IMPRESSION: 1. Single mobile gallstone with mild gallbladder wall thickening. Given the absence of pericholecystic fluid and due to the inability to evaluate for a sonographic murphy sign due to the patient's clinical status, the study is equivocal for acute cholecystitis. Alternative considerations include acute hepatitis, hyperproteinemia and volume overload from either chf or renal failure. 2. Thickening of the common bile duct wall without biliary ductal dilation. This may be due to either volume overload or ascending cholangitis , in the correct clinical context. No choledocholithiasis or biliary ductal dilation.  Electronically signed by: Rogelia Myers MD 11/22/2024 12:20 PM EST RP Workstation: [id number]   DG CHEST PORT 1 VIEW Result Date: 11/22/2024 EXAM: 1 VIEW(S) XRAY OF THE CHEST 11/22/2024 10:12:53 AM COMPARISON: 11/20/2024 CLINICAL HISTORY: Shortness of breath FINDINGS: LUNGS AND PLEURA: Small to moderate layering bilateral pleural effusions. Patchy bibasilar airspace opacities, more dense in the retrocardiac left lung base. No pneumothorax. HEART AND MEDIASTINUM: Cardiomegaly, unchanged. Atherosclerotic aortic calcifications. Aortic atherosclerosis. BONES AND SOFT TISSUES: No acute osseous abnormality. IMPRESSION: 1. Little significant interval change to the layering bilateral pleural effusions and bibasilar airspace disease. 2. Cardiomegaly, unchanged. Electronically signed by: Rogelia Myers MD 11/22/2024 11:32 AM EST RP Workstation: [id number]   DG CHEST PORT 1 VIEW Result Date: 11/20/2024 EXAM: 1 VIEW(S)  XRAY OF THE CHEST 11/20/2024 08:44:00 AM COMPARISON: 11/06/2024 CLINICAL HISTORY: Shortness of breath FINDINGS: LUNGS AND PLEURA: Small bilateral pleural effusions, increased on the right. Unchanged interstitial edema and patchy pulmonary opacities within the right lung. Persistent Dense left retrocardiac opacity. No pneumothorax. HEART AND MEDIASTINUM: Cardiomegaly. Aortic arch atherosclerosis. BONES AND SOFT TISSUES: No acute osseous abnormality. IMPRESSION: 1. Unchanged interstitial edema and patchy pulmonary opacities within the right lung. 2. Persistent dense left retrocardiac opacity. 3. Small bilateral pleural effusions, increased on the right. 4. Cardiomegaly. Electronically signed by: Waddell Calk MD 11/20/2024 09:13 AM EST RP Workstation: [id number]    Microbiology: No results found for this or any previous visit (from the past 240 hours).    Signed: Nydia Distance, MD Jan 10, 2025   "

## 2024-12-18 NOTE — Progress Notes (Signed)
 Patient's daughter notified of patient's progressive decline, questions answered by this RN. Patient's daughter called back several minutes later, other questions were answered by this RN. Patient's daughter on her way at this time.

## 2024-12-18 NOTE — Progress Notes (Signed)
 Patient alert, disoriented, not engaging as much as usual. Oral intake offered, patient only took a sip and refused. Regular bed switched to special air mattress. X2 bowel movement throughout shift, bed bath given, medicated , sacral dressing changed, frequent repositionning,  confort care continue

## 2024-12-18 DEATH — deceased

## 2024-12-26 ENCOUNTER — Ambulatory Visit: Payer: Self-pay | Admitting: Infectious Disease
